# Patient Record
Sex: Male | Born: 1965
Health system: Southern US, Community
[De-identification: ages and names within clinical notes are randomized; demographics above are authoritative.]

## PROBLEM LIST (undated history)

## (undated) DIAGNOSIS — I10 Essential (primary) hypertension: Secondary | ICD-10-CM

## (undated) DIAGNOSIS — Z9989 Dependence on other enabling machines and devices: Secondary | ICD-10-CM

## (undated) DIAGNOSIS — G894 Chronic pain syndrome: Secondary | ICD-10-CM

## (undated) DIAGNOSIS — M1990 Unspecified osteoarthritis, unspecified site: Secondary | ICD-10-CM

## (undated) DIAGNOSIS — Z8619 Personal history of other infectious and parasitic diseases: Secondary | ICD-10-CM

## (undated) DIAGNOSIS — Z87442 Personal history of urinary calculi: Secondary | ICD-10-CM

## (undated) DIAGNOSIS — T148XXA Other injury of unspecified body region, initial encounter: Secondary | ICD-10-CM

## (undated) DIAGNOSIS — Z993 Dependence on wheelchair: Secondary | ICD-10-CM

## (undated) DIAGNOSIS — M8660 Other chronic osteomyelitis, unspecified site: Secondary | ICD-10-CM

## (undated) DIAGNOSIS — F32A Depression, unspecified: Secondary | ICD-10-CM

## (undated) DIAGNOSIS — J9621 Acute and chronic respiratory failure with hypoxia: Secondary | ICD-10-CM

## (undated) DIAGNOSIS — F419 Anxiety disorder, unspecified: Secondary | ICD-10-CM

## (undated) DIAGNOSIS — R7881 Bacteremia: Secondary | ICD-10-CM

## (undated) DIAGNOSIS — T07XXXA Unspecified multiple injuries, initial encounter: Secondary | ICD-10-CM

## (undated) DIAGNOSIS — J45909 Unspecified asthma, uncomplicated: Secondary | ICD-10-CM

## (undated) DIAGNOSIS — J449 Chronic obstructive pulmonary disease, unspecified: Secondary | ICD-10-CM

## (undated) HISTORY — PX: HIP SURGERY: SHX245

## (undated) HISTORY — DX: Personal history of other infectious and parasitic diseases: Z86.19

## (undated) HISTORY — PX: SHOULDER SURGERY: SHX246

## (undated) HISTORY — PX: FACIAL RECONSTRUCTION SURGERY: SHX631

---

## 2009-11-11 DIAGNOSIS — J189 Pneumonia, unspecified organism: Secondary | ICD-10-CM

## 2009-11-11 HISTORY — DX: Pneumonia, unspecified organism: J18.9

## 2011-10-31 ENCOUNTER — Encounter (HOSPITAL_BASED_OUTPATIENT_CLINIC_OR_DEPARTMENT_OTHER): Payer: Self-pay | Admitting: *Deleted

## 2011-10-31 ENCOUNTER — Emergency Department (HOSPITAL_BASED_OUTPATIENT_CLINIC_OR_DEPARTMENT_OTHER)
Admission: EM | Admit: 2011-10-31 | Discharge: 2011-10-31 | Disposition: A | Discharge status: Home / Self Care | Payer: Worker's Compensation | Attending: Emergency Medicine | Admitting: Emergency Medicine

## 2011-10-31 DIAGNOSIS — Y9289 Other specified places as the place of occurrence of the external cause: Secondary | ICD-10-CM | POA: Insufficient documentation

## 2011-10-31 DIAGNOSIS — W2209XA Striking against other stationary object, initial encounter: Secondary | ICD-10-CM | POA: Insufficient documentation

## 2011-10-31 DIAGNOSIS — S0990XA Unspecified injury of head, initial encounter: Secondary | ICD-10-CM | POA: Insufficient documentation

## 2011-10-31 DIAGNOSIS — T148XXA Other injury of unspecified body region, initial encounter: Secondary | ICD-10-CM

## 2011-10-31 MED ORDER — TRAMADOL HCL 50 MG PO TABS: 50.0000 mg | ORAL_TABLET | Freq: Four times a day (QID) | ORAL | Status: AC | PRN

## 2011-10-31 MED ORDER — TETANUS-DIPHTH-ACELL PERTUSSIS 5-2.5-18.5 LF-MCG/0.5 IM SUSP
0.5000 mL | Freq: Once | INTRAMUSCULAR | Status: AC
  Administered 2011-10-31: 0.5 mL via INTRAMUSCULAR
  Filled 2011-10-31: qty 0.5

## 2011-10-31 NOTE — ED Notes (Signed)
MD at bedside. 

## 2011-10-31 NOTE — Discharge Instructions (Signed)
Abrasions Abrasions are skin scrapes. Their treatment depends on how large and deep the abrasion is. Abrasions do not extend through all layers of the skin. A cut or lesion through all skin layers is called a laceration. HOME CARE INSTRUCTIONS   If you were given a dressing, change it at least once a day or as instructed by your caregiver. If the bandage sticks, soak it off with a solution of water or hydrogen peroxide.   Twice a day, wash the area with soap and water to remove all the cream/ointment. You may do this in a sink, under a tub faucet, or in a shower. Rinse off the soap and pat dry with a clean towel. Look for signs of infection (see below).   Reapply cream/ointment according to your caregiver's instruction. This will help prevent infection and keep the bandage from sticking. Telfa or gauze over the wound and under the dressing or wrap will also help keep the bandage from sticking.   If the bandage becomes wet, dirty, or develops a foul smell, change it as soon as possible.   Only take over-the-counter or prescription medicines for pain, discomfort, or fever as directed by your caregiver.  SEEK IMMEDIATE MEDICAL CARE IF:   Increasing pain in the wound.   Signs of infection develop: redness, swelling, surrounding area is tender to touch, or pus coming from the wound.   You have a fever.   Any foul smell coming from the wound or dressing.  Most skin wounds heal within ten days. Facial wounds heal faster. However, an infection may occur despite proper treatment. You should have the wound checked for signs of infection within 24 to 48 hours or sooner if problems arise. If you were not given a wound-check appointment, look closely at the wound yourself on the second day for early signs of infection listed above. MAKE SURE YOU:   Understand these instructions.   Will watch your condition.   Will get help right away if you are not doing well or get worse.  Document Released:  03/09/2005 Document Revised: 05/19/2011 Document Reviewed: 05/03/2011 Exit[employer] Patient Information 2012 [employer]. 

## 2011-10-31 NOTE — ED Notes (Signed)
Forehead and bridge of nose cleansed prior to DC.
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
## 2011-10-31 NOTE — ED Notes (Signed)
Pt was at work.  Raised up from kneeling and hit foreheadon metal object.  Fell back to floor. Denies LOC.  Dizzy.  Sts he stayed sitting on floor for 10 min.  C/O pain in forehead and bridge of nose.

## 2011-10-31 NOTE — ED Provider Notes (Signed)
History     CSN: [id number]  Arrival date & time 10/31/11  2042   First MD Initiated Contact with Patient 10/31/11 2102      Chief Complaint  Patient presents with  . Head Injury    (Consider location/radiation/quality/duration/timing/severity/associated sxs/prior treatment) HPI The patient is a 46 yo man, no significant PMH, presenting with a head injury.  The patient was at work 1 hour PTA, squatting while moving some boxes, turned around and stood up, but hit his forehead and the bridge of his nose on an overlying metal shelf.  He fell backwards in surprise afterwards, and noted significant face pain, but did not lose conscious.  No prodromal dizziness, LH, aura, or focal neuro deficit.  The patient noted mild lightheadedness after the event, but no vomiting, significant bleeding, LOC, observed tonic-clonic movements, loss of bowel/bladder, tongue biting, or confusion.  No discharge from nose or ears.  History reviewed. No pertinent past medical history.  Past Surgical History  Procedure Date  . Shoulder surgery   . Hip surgery     No family history on file.  History  Substance Use Topics  . Smoking status: Current Everyday Smoker -- 1.0 packs/day  . Smokeless tobacco: Not on file  . Alcohol Use: Yes     "Every now and then"      Review of Systems General: no fevers, chills, changes in weight, changes in appetite Skin: no rash HEENT: no blurry vision, hearing changes, sore throat Pulm: no dyspnea, coughing, wheezing CV: no chest pain, palpitations, shortness of breath Abd: no abdominal pain, nausea/vomiting, diarrhea/constipation GU: no dysuria, hematuria, polyuria Ext: no arthralgias, myalgias Neuro: no weakness, numbness, or tingling  Allergies  Review of patient's allergies indicates no known allergies.  Home Medications  No current outpatient prescriptions on file.  BP 136/79  Pulse 67  Temp(Src) 97.5 F (36.4 C) (Oral)  Resp 16  Ht 6\' 3"  (1.905 m)   Wt 145 lb (65.772 kg)  BMI 18.12 kg/m2  SpO2 100%  Physical Exam General: alert, cooperative, and in no apparent distress HEENT: superficial lacerations noted on left forehead and bridge of nose.  No pain on palpation or manipulation of frontal bones, nasal cartilage, or facial bones.  PERRL, EOMI, no nystagmus, vision intact, oropharynx clear and non-erythematous  Neck: supple, no lymphadenopathy, no JVD Lungs: clear to ascultation bilaterally, normal work of respiration, no wheezes, rales, ronchi Heart: regular rate and rhythm, no murmurs, gallops, or rubs Abdomen: soft, non-tender, non-distended, normal bowel sounds Extremities: no cyanosis, clubbing, or edema Neurologic: alert & oriented X3, cranial nerves II-XII intact, strength grossly intact, sensation intact to light touch  ED Course  Procedures (including critical care time)  Labs Reviewed - No data to display No results found.   No diagnosis found.    MDM   1. Facial lacerations - patient presents with superficial facial lacerations s/p mechanical head injury to a metal shelf while rising from a squatting position, with no prodrome, LOC, vomiting, or seizure-like activity.  Complaints likely represent superficial facial laceration.  No indication for head CT or other work-up at this time, given benign presentation and normal neuro exam. -no further work-up indicated  2. Dispo - discharge home with pain control  Signed, Janalyn Harder, PGY1 10/31/2011, 9:40 PM  Linward Headland, MD 10/31/11 2213

## 2011-11-01 NOTE — ED Provider Notes (Signed)
I saw and evaluated the patient, reviewed the resident's note and I agree with the findings and plan.   .Face to face Exam:  General:  Awake HEENT:  Atraumatic Resp:  Normal effort Abd:  Nondistended Neuro:No focal weakness Lymph: No adenopathy   Deloros Beretta L Chaselyn Nanney, MD 11/01/11 1645 

## 2017-04-06 ENCOUNTER — Emergency Department (HOSPITAL_BASED_OUTPATIENT_CLINIC_OR_DEPARTMENT_OTHER)
Admission: EM | Admit: 2017-04-06 | Discharge: 2017-04-06 | Disposition: A | Discharge status: Home / Self Care | Payer: 59 | Attending: Emergency Medicine | Admitting: Emergency Medicine

## 2017-04-06 ENCOUNTER — Encounter (HOSPITAL_BASED_OUTPATIENT_CLINIC_OR_DEPARTMENT_OTHER): Payer: Self-pay | Admitting: Emergency Medicine

## 2017-04-06 DIAGNOSIS — K0889 Other specified disorders of teeth and supporting structures: Secondary | ICD-10-CM | POA: Diagnosis present

## 2017-04-06 DIAGNOSIS — K047 Periapical abscess without sinus: Secondary | ICD-10-CM

## 2017-04-06 DIAGNOSIS — F17200 Nicotine dependence, unspecified, uncomplicated: Secondary | ICD-10-CM | POA: Diagnosis not present

## 2017-04-06 MED ORDER — PENICILLIN V POTASSIUM 500 MG PO TABS: 500.0000 mg | ORAL_TABLET | Freq: Four times a day (QID) | ORAL | 0 refills | Status: AC

## 2017-04-06 MED FILL — PENICILLIN VK 500 MG TABLET: 500 | 7 days supply | Qty: 28 | Fill #0

## 2017-04-06 NOTE — ED Provider Notes (Signed)
[facility] EMERGENCY DEPARTMENT Provider Note  CSN: [id number] Arrival date & time: 04/06/17 0704  Chief Complaint(s) Dental Pain  HPI Christopher [name] is a 51 y.o. male who presents with 2 days of right lower molar pain and 1 day of swelling.  No associated fevers, chills, nausea, vomiting, neck pain or swelling.  No shortness of breath or trouble breathing.  Pain is exacerbated with chewing and palpation.  Relieved by Motrin.  No other alleviating or aggravating factors.  No other associated symptoms.  HPI  Past Medical History History reviewed. No pertinent past medical history. There are no active problems to display for this patient.  Home Medication(s) Prior to Admission medications   Medication Sig Start Date End Date Taking? Authorizing Provider  ibuprofen (ADVIL,MOTRIN) 200 MG tablet Take 600 mg by mouth every 6 (six) hours as needed. Patient used this medication for his headache.    [provider]  penicillin v potassium (VEETID) 500 MG tablet Take 1 tablet (500 mg total) by mouth 4 (four) times daily. 04/06/17 04/13/17  Nira Conn, MD                                                                                                                                    Past Surgical History Past Surgical History:  Procedure Laterality Date  . HIP SURGERY    . SHOULDER SURGERY     Family History History reviewed. No pertinent family history.  Social History Social History  Substance Use Topics  . Smoking status: Current Every Day Smoker    Packs/day: 1.00  . Smokeless tobacco: Never Used  . Alcohol use Yes     Comment: "Every now and then"   Allergies Patient has no known allergies.  Review of Systems Review of Systems Noted in HPI  Physical Exam Vital Signs  I have reviewed the triage vital signs BP (!) 149/90 (BP Location: Left Arm)   Pulse (!) 101   Temp (!) 97.5 F (36.4 C) (Oral)   Resp 18   Ht 6\' 3"  (1.905 m)   Wt  68 kg (150 lb)   SpO2 100%   BMI 18.75 kg/m   Physical Exam  Constitutional: He is oriented to person, place, and time. He appears well-developed and well-nourished. No distress.  HENT:  Head: Normocephalic and atraumatic.    Right Ear: External ear normal.  Left Ear: External ear normal.  Nose: Nose normal.  Mouth/Throat: Mucous membranes are normal. No trismus in the jaw. Abnormal dentition. Dental caries present.    Eyes: Conjunctivae and EOM are normal. No scleral icterus.  Neck: Normal range of motion and phonation normal.  Cardiovascular: Normal rate and regular rhythm.   Pulmonary/Chest: Effort normal. No stridor. No respiratory distress.  Abdominal: He exhibits no distension.  Musculoskeletal: Normal range of motion. He exhibits no edema.  Neurological: He is alert and oriented to person, place, and time.  Skin: He is not diaphoretic.  Psychiatric: He has a normal mood and affect. His behavior is normal.  Vitals reviewed.   ED Results and Treatments Labs (all labs ordered are listed, but only abnormal results are displayed) Labs Reviewed - No data to display                                                                                                                       EKG  EKG Interpretation  Date/Time:    Ventricular Rate:    PR Interval:    QRS Duration:   QT Interval:    QTC Calculation:   R Axis:     Text Interpretation:        Radiology No results found. Pertinent labs & imaging results that were available during my care of the patient were reviewed by me and considered in my medical decision making (see chart for details).  Medications Ordered in ED Medications - No data to display                                                                                                                                  Procedures Procedures  (including critical care time)  Medical Decision Making / ED Course I have reviewed the nursing notes for  this encounter and the patient's prior records (if available in EHR or on provided paperwork).    Dental abscess. No evidence of Ludwig's angina or deeper tissue infection. No drainable fluctuance appreciated. Will treat with Abx. Pt has dental insurance and will set up dentistry follow up within the week.  The patient is safe for discharge with strict return precautions.   Final Clinical Impression(s) / ED Diagnoses Final diagnoses:  Dental abscess    Disposition: Discharge  Condition: Good  I have discussed the results, Dx and Tx plan with the patient who expressed understanding and agree(s) with the plan. Discharge instructions discussed at great length. The patient was given strict return precautions who verbalized understanding of the instructions. No further questions at time of discharge.    New Prescriptions   PENICILLIN V POTASSIUM (VEETID) 500 MG TABLET    Take 1 tablet (500 mg total) by mouth 4 (four) times daily.    Follow Up: Dentistry   in 3-5 days for close follow up and further management     This chart was dictated using voice recognition software.  Despite best efforts to  proofread,  errors can occur which can change the documentation meaning.   Nira Connardama, Megham Dwyer Eduardo, MD 04/06/17 [phone]

## 2017-04-06 NOTE — ED Triage Notes (Signed)
Patient states that he has pain and swelling to his right lower jaw and dental pain

## 2018-06-13 DIAGNOSIS — I82409 Acute embolism and thrombosis of unspecified deep veins of unspecified lower extremity: Secondary | ICD-10-CM

## 2018-06-13 DIAGNOSIS — J9621 Acute and chronic respiratory failure with hypoxia: Secondary | ICD-10-CM

## 2018-06-13 DIAGNOSIS — Z9289 Personal history of other medical treatment: Secondary | ICD-10-CM

## 2018-06-13 DIAGNOSIS — B965 Pseudomonas (aeruginosa) (mallei) (pseudomallei) as the cause of diseases classified elsewhere: Secondary | ICD-10-CM

## 2018-06-13 DIAGNOSIS — R7881 Bacteremia: Secondary | ICD-10-CM

## 2018-06-13 HISTORY — DX: Acute and chronic respiratory failure with hypoxia: J96.21

## 2018-06-13 HISTORY — DX: Bacteremia: R78.81

## 2018-06-13 HISTORY — DX: Pseudomonas (aeruginosa) (mallei) (pseudomallei) as the cause of diseases classified elsewhere: B96.5

## 2018-06-13 HISTORY — PX: COLON SURGERY: SHX602

## 2018-06-13 HISTORY — DX: Acute embolism and thrombosis of unspecified deep veins of unspecified lower extremity: I82.409

## 2018-06-13 HISTORY — DX: Personal history of other medical treatment: Z92.89

## 2018-07-10 ENCOUNTER — Inpatient Hospital Stay (HOSPITAL_COMMUNITY)
Admission: EM | Admit: 2018-07-10 | Discharge: 2018-10-16 | DRG: 003 | Disposition: A | Discharge status: Other Acute Inpatient Hospital | Payer: No Typology Code available for payment source | Attending: General Surgery | Admitting: General Surgery

## 2018-07-10 ENCOUNTER — Emergency Department (HOSPITAL_COMMUNITY): Payer: No Typology Code available for payment source

## 2018-07-10 ENCOUNTER — Emergency Department (HOSPITAL_COMMUNITY): Payer: No Typology Code available for payment source | Admitting: Certified Registered"

## 2018-07-10 ENCOUNTER — Other Ambulatory Visit: Payer: Self-pay

## 2018-07-10 ENCOUNTER — Encounter (HOSPITAL_COMMUNITY)
Admission: EM | Disposition: A | Discharge status: Nursing Home (Includes ICF) | Payer: Self-pay | Source: Home / Self Care

## 2018-07-10 ENCOUNTER — Encounter (HOSPITAL_COMMUNITY): Payer: Self-pay

## 2018-07-10 ENCOUNTER — Inpatient Hospital Stay (HOSPITAL_COMMUNITY): Payer: No Typology Code available for payment source

## 2018-07-10 DIAGNOSIS — R58 Hemorrhage, not elsewhere classified: Secondary | ICD-10-CM | POA: Diagnosis present

## 2018-07-10 DIAGNOSIS — S31104A Unspecified open wound of abdominal wall, left lower quadrant without penetration into peritoneal cavity, initial encounter: Secondary | ICD-10-CM | POA: Diagnosis present

## 2018-07-10 DIAGNOSIS — R339 Retention of urine, unspecified: Secondary | ICD-10-CM | POA: Diagnosis present

## 2018-07-10 DIAGNOSIS — J189 Pneumonia, unspecified organism: Secondary | ICD-10-CM | POA: Diagnosis not present

## 2018-07-10 DIAGNOSIS — T1490XA Injury, unspecified, initial encounter: Secondary | ICD-10-CM

## 2018-07-10 DIAGNOSIS — S31829A Unspecified open wound of left buttock, initial encounter: Secondary | ICD-10-CM | POA: Diagnosis not present

## 2018-07-10 DIAGNOSIS — R739 Hyperglycemia, unspecified: Secondary | ICD-10-CM | POA: Diagnosis present

## 2018-07-10 DIAGNOSIS — S381XXA Crushing injury of abdomen, lower back, and pelvis, initial encounter: Secondary | ICD-10-CM

## 2018-07-10 DIAGNOSIS — Q899 Congenital malformation, unspecified: Secondary | ICD-10-CM

## 2018-07-10 DIAGNOSIS — E872 Acidosis: Secondary | ICD-10-CM | POA: Diagnosis present

## 2018-07-10 DIAGNOSIS — Z419 Encounter for procedure for purposes other than remedying health state, unspecified: Secondary | ICD-10-CM

## 2018-07-10 DIAGNOSIS — W230XXA Caught, crushed, jammed, or pinched between moving objects, initial encounter: Secondary | ICD-10-CM | POA: Diagnosis present

## 2018-07-10 DIAGNOSIS — Z930 Tracheostomy status: Secondary | ICD-10-CM

## 2018-07-10 DIAGNOSIS — D62 Acute posthemorrhagic anemia: Secondary | ICD-10-CM | POA: Diagnosis not present

## 2018-07-10 DIAGNOSIS — S32811B Multiple fractures of pelvis with unstable disruption of pelvic ring, initial encounter for open fracture: Secondary | ICD-10-CM | POA: Diagnosis present

## 2018-07-10 DIAGNOSIS — E43 Unspecified severe protein-calorie malnutrition: Secondary | ICD-10-CM | POA: Diagnosis present

## 2018-07-10 DIAGNOSIS — E870 Hyperosmolality and hypernatremia: Secondary | ICD-10-CM | POA: Diagnosis not present

## 2018-07-10 DIAGNOSIS — S71102A Unspecified open wound, left thigh, initial encounter: Secondary | ICD-10-CM | POA: Diagnosis not present

## 2018-07-10 DIAGNOSIS — S31819A Unspecified open wound of right buttock, initial encounter: Secondary | ICD-10-CM | POA: Diagnosis not present

## 2018-07-10 DIAGNOSIS — S3130XA Unspecified open wound of scrotum and testes, initial encounter: Secondary | ICD-10-CM | POA: Diagnosis present

## 2018-07-10 DIAGNOSIS — E875 Hyperkalemia: Secondary | ICD-10-CM | POA: Diagnosis not present

## 2018-07-10 DIAGNOSIS — J9600 Acute respiratory failure, unspecified whether with hypoxia or hypercapnia: Secondary | ICD-10-CM | POA: Diagnosis not present

## 2018-07-10 DIAGNOSIS — K117 Disturbances of salivary secretion: Secondary | ICD-10-CM

## 2018-07-10 DIAGNOSIS — L8990 Pressure ulcer of unspecified site, unspecified stage: Secondary | ICD-10-CM

## 2018-07-10 DIAGNOSIS — R600 Localized edema: Secondary | ICD-10-CM | POA: Diagnosis not present

## 2018-07-10 DIAGNOSIS — E876 Hypokalemia: Secondary | ICD-10-CM | POA: Diagnosis present

## 2018-07-10 DIAGNOSIS — Z681 Body mass index (BMI) 19 or less, adult: Secondary | ICD-10-CM

## 2018-07-10 DIAGNOSIS — T79A3XA Traumatic compartment syndrome of abdomen, initial encounter: Secondary | ICD-10-CM | POA: Diagnosis present

## 2018-07-10 DIAGNOSIS — Z09 Encounter for follow-up examination after completed treatment for conditions other than malignant neoplasm: Secondary | ICD-10-CM

## 2018-07-10 DIAGNOSIS — R578 Other shock: Secondary | ICD-10-CM | POA: Diagnosis present

## 2018-07-10 DIAGNOSIS — J9621 Acute and chronic respiratory failure with hypoxia: Secondary | ICD-10-CM | POA: Diagnosis not present

## 2018-07-10 DIAGNOSIS — R000 Tachycardia, unspecified: Secondary | ICD-10-CM | POA: Diagnosis not present

## 2018-07-10 DIAGNOSIS — J9601 Acute respiratory failure with hypoxia: Secondary | ICD-10-CM | POA: Diagnosis present

## 2018-07-10 DIAGNOSIS — S2242XA Multiple fractures of ribs, left side, initial encounter for closed fracture: Secondary | ICD-10-CM | POA: Diagnosis present

## 2018-07-10 DIAGNOSIS — B9689 Other specified bacterial agents as the cause of diseases classified elsewhere: Secondary | ICD-10-CM | POA: Diagnosis not present

## 2018-07-10 DIAGNOSIS — S3730XA Unspecified injury of urethra, initial encounter: Secondary | ICD-10-CM | POA: Diagnosis present

## 2018-07-10 DIAGNOSIS — R7881 Bacteremia: Secondary | ICD-10-CM | POA: Diagnosis not present

## 2018-07-10 DIAGNOSIS — N390 Urinary tract infection, site not specified: Secondary | ICD-10-CM | POA: Diagnosis not present

## 2018-07-10 DIAGNOSIS — N179 Acute kidney failure, unspecified: Secondary | ICD-10-CM | POA: Diagnosis not present

## 2018-07-10 DIAGNOSIS — B952 Enterococcus as the cause of diseases classified elsewhere: Secondary | ICD-10-CM | POA: Diagnosis not present

## 2018-07-10 DIAGNOSIS — J95851 Ventilator associated pneumonia: Secondary | ICD-10-CM

## 2018-07-10 DIAGNOSIS — I10 Essential (primary) hypertension: Secondary | ICD-10-CM | POA: Diagnosis present

## 2018-07-10 DIAGNOSIS — T07XXXA Unspecified multiple injuries, initial encounter: Secondary | ICD-10-CM | POA: Diagnosis not present

## 2018-07-10 DIAGNOSIS — D696 Thrombocytopenia, unspecified: Secondary | ICD-10-CM | POA: Diagnosis not present

## 2018-07-10 DIAGNOSIS — S332XXA Dislocation of sacroiliac and sacrococcygeal joint, initial encounter: Secondary | ICD-10-CM | POA: Diagnosis present

## 2018-07-10 DIAGNOSIS — N4889 Other specified disorders of penis: Secondary | ICD-10-CM | POA: Diagnosis present

## 2018-07-10 DIAGNOSIS — R509 Fever, unspecified: Secondary | ICD-10-CM | POA: Diagnosis not present

## 2018-07-10 DIAGNOSIS — S7290XA Unspecified fracture of unspecified femur, initial encounter for closed fracture: Secondary | ICD-10-CM

## 2018-07-10 DIAGNOSIS — R1110 Vomiting, unspecified: Secondary | ICD-10-CM

## 2018-07-10 DIAGNOSIS — S72002A Fracture of unspecified part of neck of left femur, initial encounter for closed fracture: Secondary | ICD-10-CM | POA: Diagnosis present

## 2018-07-10 DIAGNOSIS — R001 Bradycardia, unspecified: Secondary | ICD-10-CM | POA: Diagnosis not present

## 2018-07-10 DIAGNOSIS — T148XXA Other injury of unspecified body region, initial encounter: Secondary | ICD-10-CM

## 2018-07-10 DIAGNOSIS — G894 Chronic pain syndrome: Secondary | ICD-10-CM | POA: Diagnosis not present

## 2018-07-10 DIAGNOSIS — H93299 Other abnormal auditory perceptions, unspecified ear: Secondary | ICD-10-CM

## 2018-07-10 DIAGNOSIS — Z4659 Encounter for fitting and adjustment of other gastrointestinal appliance and device: Secondary | ICD-10-CM

## 2018-07-10 DIAGNOSIS — Z978 Presence of other specified devices: Secondary | ICD-10-CM

## 2018-07-10 DIAGNOSIS — S81802A Unspecified open wound, left lower leg, initial encounter: Secondary | ICD-10-CM | POA: Diagnosis not present

## 2018-07-10 DIAGNOSIS — K56609 Unspecified intestinal obstruction, unspecified as to partial versus complete obstruction: Secondary | ICD-10-CM

## 2018-07-10 DIAGNOSIS — Z9911 Dependence on respirator [ventilator] status: Secondary | ICD-10-CM

## 2018-07-10 DIAGNOSIS — I96 Gangrene, not elsewhere classified: Secondary | ICD-10-CM | POA: Diagnosis not present

## 2018-07-10 DIAGNOSIS — S329XXA Fracture of unspecified parts of lumbosacral spine and pelvis, initial encounter for closed fracture: Secondary | ICD-10-CM

## 2018-07-10 DIAGNOSIS — Z885 Allergy status to narcotic agent status: Secondary | ICD-10-CM

## 2018-07-10 DIAGNOSIS — R21 Rash and other nonspecific skin eruption: Secondary | ICD-10-CM | POA: Diagnosis not present

## 2018-07-10 DIAGNOSIS — T17908A Unspecified foreign body in respiratory tract, part unspecified causing other injury, initial encounter: Secondary | ICD-10-CM

## 2018-07-10 DIAGNOSIS — R1310 Dysphagia, unspecified: Secondary | ICD-10-CM | POA: Diagnosis not present

## 2018-07-10 DIAGNOSIS — J9690 Respiratory failure, unspecified, unspecified whether with hypoxia or hypercapnia: Secondary | ICD-10-CM

## 2018-07-10 DIAGNOSIS — S81809A Unspecified open wound, unspecified lower leg, initial encounter: Secondary | ICD-10-CM

## 2018-07-10 DIAGNOSIS — B965 Pseudomonas (aeruginosa) (mallei) (pseudomallei) as the cause of diseases classified elsewhere: Secondary | ICD-10-CM | POA: Diagnosis not present

## 2018-07-10 DIAGNOSIS — F17210 Nicotine dependence, cigarettes, uncomplicated: Secondary | ICD-10-CM | POA: Diagnosis present

## 2018-07-10 DIAGNOSIS — E878 Other disorders of electrolyte and fluid balance, not elsewhere classified: Secondary | ICD-10-CM | POA: Diagnosis not present

## 2018-07-10 DIAGNOSIS — S3994XA Unspecified injury of external genitals, initial encounter: Secondary | ICD-10-CM

## 2018-07-10 DIAGNOSIS — J811 Chronic pulmonary edema: Secondary | ICD-10-CM

## 2018-07-10 DIAGNOSIS — D638 Anemia in other chronic diseases classified elsewhere: Secondary | ICD-10-CM | POA: Diagnosis present

## 2018-07-10 DIAGNOSIS — S3120XA Unspecified open wound of penis, initial encounter: Secondary | ICD-10-CM | POA: Diagnosis present

## 2018-07-10 HISTORY — PX: IR ANGIOGRAM PELVIS SELECTIVE OR SUPRASELECTIVE: IMG661

## 2018-07-10 HISTORY — PX: IR US GUIDE VASC ACCESS RIGHT: IMG2390

## 2018-07-10 HISTORY — PX: IR EMBO ART  VEN HEMORR LYMPH EXTRAV  INC GUIDE ROADMAPPING: IMG5450

## 2018-07-10 HISTORY — PX: LAPAROTOMY: SHX154

## 2018-07-10 HISTORY — PX: IR US GUIDE BX ASP/DRAIN: IMG2392

## 2018-07-10 HISTORY — PX: RADIOLOGY WITH ANESTHESIA: SHX6223

## 2018-07-10 HISTORY — PX: WOUND EXPLORATION: SHX6188

## 2018-07-10 HISTORY — PX: APPLICATION OF WOUND VAC: SHX5189

## 2018-07-10 HISTORY — PX: IR ANGIOGRAM SELECTIVE EACH ADDITIONAL VESSEL: IMG667

## 2018-07-10 HISTORY — PX: IR VENO/EXT/UNI LEFT: IMG675

## 2018-07-10 LAB — DIC (DISSEMINATED INTRAVASCULAR COAGULATION)PANEL
D-Dimer, Quant: 12.56 ug/mL-FEU — ABNORMAL HIGH (ref 0.00–0.50)
D-Dimer, Quant: >20 ug/mL-FEU — ABNORMAL HIGH (ref 0.00–0.50)
Fibrinogen: 184 mg/dL — ABNORMAL LOW (ref 210–475)
Fibrinogen: 220 mg/dL (ref 210–475)
INR: 1.39
INR: 1.41
Platelets: 142 10*3/uL — ABNORMAL LOW (ref 150–400)
Platelets: 43 10*3/uL — ABNORMAL LOW (ref 150–400)
Prothrombin Time: 16.9 seconds — ABNORMAL HIGH (ref 11.4–15.2)
Smear Review: NONE SEEN
Smear Review: NONE SEEN
aPTT: 30 seconds (ref 24–36)

## 2018-07-10 LAB — CBC
HCT: 31.1 % — ABNORMAL LOW (ref 39.0–52.0)
Hemoglobin: 9.2 g/dL — ABNORMAL LOW (ref 13.0–17.0)
MCH: 32.4 pg (ref 26.0–34.0)
MCHC: 29.6 g/dL — ABNORMAL LOW (ref 30.0–36.0)
MCV: 109.5 fL — ABNORMAL HIGH (ref 80.0–100.0)
Platelets: 142 10*3/uL — ABNORMAL LOW (ref 150–400)
RBC: 2.84 MIL/uL — ABNORMAL LOW (ref 4.22–5.81)
RDW: 13 % (ref 11.5–15.5)
WBC: 19.5 10*3/uL — ABNORMAL HIGH (ref 4.0–10.5)
nRBC: 0 % (ref 0.0–0.2)

## 2018-07-10 LAB — PROTIME-INR
INR: 1.36
Prothrombin Time: 16.6 seconds — ABNORMAL HIGH (ref 11.4–15.2)

## 2018-07-10 LAB — COMPREHENSIVE METABOLIC PANEL
ALT: 30 U/L (ref 0–44)
AST: 32 U/L (ref 15–41)
Albumin: 2.5 g/dL — ABNORMAL LOW (ref 3.5–5.0)
Alkaline Phosphatase: 48 U/L (ref 38–126)
Anion gap: 8 (ref 5–15)
BUN: 18 mg/dL (ref 6–20)
CO2: 16 mmol/L — ABNORMAL LOW (ref 22–32)
Calcium: 7.6 mg/dL — ABNORMAL LOW (ref 8.9–10.3)
Chloride: 116 mmol/L — ABNORMAL HIGH (ref 98–111)
Creatinine, Ser: 0.9 mg/dL (ref 0.61–1.24)
GFR calc Af Amer: >60 mL/min (ref >60)
GFR calc non Af Amer: >60 mL/min (ref >60)
Glucose, Bld: 175 mg/dL — ABNORMAL HIGH (ref 70–99)
Potassium: 2.9 mmol/L — ABNORMAL LOW (ref 3.5–5.1)
Sodium: 140 mmol/L (ref 135–145)
Total Bilirubin: 0.6 mg/dL (ref 0.3–1.2)
Total Protein: 4.2 g/dL — ABNORMAL LOW (ref 6.5–8.1)

## 2018-07-10 LAB — ABO/RH: ABO/RH(D): O POS

## 2018-07-10 LAB — LACTIC ACID, PLASMA: Lactic Acid, Venous: 5.1 mmol/L (ref 0.5–1.9)

## 2018-07-10 LAB — DIC (DISSEMINATED INTRAVASCULAR COAGULATION) PANEL (NOT AT ARMC)
Prothrombin Time: 17.1 seconds — ABNORMAL HIGH (ref 11.4–15.2)
aPTT: 56 seconds — ABNORMAL HIGH (ref 24–36)

## 2018-07-10 LAB — ETHANOL: Alcohol, Ethyl (B): <10 mg/dL (ref <10)

## 2018-07-10 LAB — MASSIVE TRANSFUSION PROTOCOL ORDER (BLOOD BANK NOTIFICATION)

## 2018-07-10 LAB — CDS SEROLOGY

## 2018-07-10 SURGERY — WOUND EXPLORATION
Anesthesia: General | Site: Groin | Laterality: Right

## 2018-07-10 SURGERY — IR WITH ANESTHESIA
Anesthesia: General

## 2018-07-10 MED ORDER — FENTANYL CITRATE (PF) 250 MCG/5ML IJ SOLN
INTRAMUSCULAR | Status: DC | PRN
  Administered 2018-07-10: 100 ug via INTRAVENOUS
  Administered 2018-07-10: 50 ug via INTRAVENOUS
  Administered 2018-07-10 (×2): 100 ug via INTRAVENOUS
  Administered 2018-07-10: 50 ug via INTRAVENOUS
  Administered 2018-07-10: 100 ug via INTRAVENOUS

## 2018-07-10 MED ORDER — PANTOPRAZOLE SODIUM 40 MG IV SOLR
40.0000 mg | Freq: Every day | INTRAVENOUS | Status: DC
  Administered 2018-07-11 – 2018-08-29 (×41): 40 mg via INTRAVENOUS
  Filled 2018-07-10 (×42): qty 40

## 2018-07-10 MED ORDER — FUROSEMIDE 10 MG/ML IJ SOLN
INTRAMUSCULAR | Status: AC
  Filled 2018-07-10: qty 4

## 2018-07-10 MED ORDER — ETOMIDATE 2 MG/ML IV SOLN
INTRAVENOUS | Status: AC | PRN
  Administered 2018-07-10: 20 mg via INTRAVENOUS

## 2018-07-10 MED ORDER — CEFAZOLIN SODIUM-DEXTROSE 2-4 GM/100ML-% IV SOLN: 2.0000 g | Freq: Once | INTRAVENOUS | Status: DC

## 2018-07-10 MED ORDER — IOPAMIDOL (ISOVUE-300) INJECTION 61%
INTRAVENOUS | Status: AC
  Filled 2018-07-10: qty 100

## 2018-07-10 MED ORDER — PANTOPRAZOLE SODIUM 40 MG PO TBEC
40.0000 mg | DELAYED_RELEASE_TABLET | Freq: Every day | ORAL | Status: DC
  Administered 2018-07-14 – 2018-08-25 (×8): 40 mg via ORAL
  Filled 2018-07-10 (×15): qty 1

## 2018-07-10 MED ORDER — TRANEXAMIC ACID-NACL 1000-0.7 MG/100ML-% IV SOLN
INTRAVENOUS | Status: AC
  Filled 2018-07-10: qty 100

## 2018-07-10 MED ORDER — HEMOSTATIC AGENTS (NO CHARGE) OPTIME
TOPICAL | Status: DC | PRN
  Administered 2018-07-10: 1

## 2018-07-10 MED ORDER — FENTANYL CITRATE (PF) 250 MCG/5ML IJ SOLN
INTRAMUSCULAR | Status: AC
  Filled 2018-07-10: qty 5

## 2018-07-10 MED ORDER — LACTATED RINGERS IV SOLN
INTRAVENOUS | Status: DC
  Administered 2018-07-11 (×3): via INTRAVENOUS

## 2018-07-10 MED ORDER — FENTANYL BOLUS VIA INFUSION
50.0000 ug | INTRAVENOUS | Status: DC | PRN
  Administered 2018-07-15 – 2018-09-10 (×28): 50 ug via INTRAVENOUS
  Filled 2018-07-10: qty 50

## 2018-07-10 MED ORDER — FENTANYL CITRATE (PF) 100 MCG/2ML IJ SOLN
INTRAMUSCULAR | Status: AC
  Filled 2018-07-10: qty 4

## 2018-07-10 MED ORDER — 0.9 % SODIUM CHLORIDE (POUR BTL) OPTIME
TOPICAL | Status: DC | PRN
  Administered 2018-07-10: 1000 mL

## 2018-07-10 MED ORDER — MIDAZOLAM HCL 2 MG/2ML IJ SOLN
INTRAMUSCULAR | Status: AC
  Filled 2018-07-10: qty 2

## 2018-07-10 MED ORDER — MIDAZOLAM HCL 5 MG/5ML IJ SOLN
INTRAMUSCULAR | Status: AC | PRN
  Administered 2018-07-10: 5 mg via INTRAVENOUS

## 2018-07-10 MED ORDER — FUROSEMIDE 10 MG/ML IJ SOLN
INTRAMUSCULAR | Status: DC | PRN
  Administered 2018-07-10: 40 mg via INTRAVENOUS

## 2018-07-10 MED ORDER — ROCURONIUM BROMIDE 50 MG/5ML IV SOSY
PREFILLED_SYRINGE | INTRAVENOUS | Status: AC
  Filled 2018-07-10: qty 20

## 2018-07-10 MED ORDER — SODIUM BICARBONATE 8.4 % IV SOLN
INTRAVENOUS | Status: AC
  Filled 2018-07-10: qty 50

## 2018-07-10 MED ORDER — CALCIUM CHLORIDE 10 % IV SOLN
INTRAVENOUS | Status: DC | PRN
  Administered 2018-07-10 (×4): 500 mg via INTRAVENOUS

## 2018-07-10 MED ORDER — FENTANYL CITRATE (PF) 100 MCG/2ML IJ SOLN
INTRAMUSCULAR | Status: AC
  Filled 2018-07-10: qty 2

## 2018-07-10 MED ORDER — SUCCINYLCHOLINE CHLORIDE 20 MG/ML IJ SOLN
INTRAMUSCULAR | Status: AC | PRN
  Administered 2018-07-10: 100 mg via INTRAVENOUS

## 2018-07-10 MED ORDER — CEFAZOLIN SODIUM-DEXTROSE 2-3 GM-%(50ML) IV SOLR
INTRAVENOUS | Status: DC | PRN
  Administered 2018-07-10: 2 g via INTRAVENOUS

## 2018-07-10 MED ORDER — SODIUM CHLORIDE 0.9% IV SOLUTION: Freq: Once | INTRAVENOUS | Status: DC

## 2018-07-10 MED ORDER — MIDAZOLAM BOLUS VIA INFUSION
1.0000 mg | INTRAVENOUS | Status: DC | PRN
  Filled 2018-07-10: qty 2

## 2018-07-10 MED ORDER — MIDAZOLAM 50MG/50ML (1MG/ML) PREMIX INFUSION
0.2500 mg/h | INTRAVENOUS | Status: DC
  Filled 2018-07-10 (×2): qty 50

## 2018-07-10 MED ORDER — SODIUM BICARBONATE 8.4 % IV SOLN
INTRAVENOUS | Status: DC | PRN
  Administered 2018-07-10 (×2): 50 meq via INTRAVENOUS

## 2018-07-10 MED ORDER — ARTIFICIAL TEARS OPHTHALMIC OINT
TOPICAL_OINTMENT | OPHTHALMIC | Status: AC
  Filled 2018-07-10: qty 3.5

## 2018-07-10 MED ORDER — PHENYLEPHRINE 40 MCG/ML (10ML) SYRINGE FOR IV PUSH (FOR BLOOD PRESSURE SUPPORT)
PREFILLED_SYRINGE | INTRAVENOUS | Status: AC
  Filled 2018-07-10: qty 10

## 2018-07-10 MED ORDER — SODIUM CHLORIDE 0.9 % IV SOLN
INTRAVENOUS | Status: DC | PRN
  Administered 2018-07-10 (×3): via INTRAVENOUS

## 2018-07-10 MED ORDER — MIDAZOLAM HCL 2 MG/2ML IJ SOLN
INTRAMUSCULAR | Status: AC
  Filled 2018-07-10: qty 6

## 2018-07-10 MED ORDER — TRANEXAMIC ACID-NACL 1000-0.7 MG/100ML-% IV SOLN
INTRAVENOUS | Status: DC | PRN
  Administered 2018-07-10: 1000 mg via INTRAVENOUS

## 2018-07-10 MED ORDER — SODIUM CHLORIDE 0.9 % IV SOLN
INTRAVENOUS | Status: AC | PRN
  Administered 2018-07-10: 2000 mL via INTRAVENOUS

## 2018-07-10 MED ORDER — FENTANYL 2500MCG IN NS 250ML (10MCG/ML) PREMIX INFUSION
25.0000 ug/h | INTRAVENOUS | Status: DC
  Administered 2018-07-10: 200 ug/h via INTRAVENOUS
  Filled 2018-07-10: qty 250

## 2018-07-10 MED ORDER — CALCIUM CHLORIDE 10 % IV SOLN
INTRAVENOUS | Status: AC
  Filled 2018-07-10: qty 10

## 2018-07-10 MED ORDER — FENTANYL 2500MCG IN NS 250ML (10MCG/ML) PREMIX INFUSION
0.0000 ug/h | INTRAVENOUS | Status: DC
  Administered 2018-07-11 – 2018-07-12 (×8): 400 ug/h via INTRAVENOUS
  Administered 2018-07-13: 350 ug/h via INTRAVENOUS
  Administered 2018-07-13: 400 ug/h via INTRAVENOUS
  Administered 2018-07-13: 350 ug/h via INTRAVENOUS
  Administered 2018-07-14: 400 ug/h via INTRAVENOUS
  Administered 2018-07-14: 350 ug/h via INTRAVENOUS
  Administered 2018-07-14 – 2018-07-15 (×4): 400 ug/h via INTRAVENOUS
  Administered 2018-07-15: 375 ug/h via INTRAVENOUS
  Administered 2018-07-15 – 2018-07-27 (×45): 400 ug/h via INTRAVENOUS
  Administered 2018-07-27: 300 ug/h via INTRAVENOUS
  Administered 2018-07-27: 350 ug/h via INTRAVENOUS
  Administered 2018-07-27 – 2018-07-30 (×11): 400 ug/h via INTRAVENOUS
  Administered 2018-07-30: 150 ug/h via INTRAVENOUS
  Administered 2018-07-31 (×3): 400 ug/h via INTRAVENOUS
  Administered 2018-07-31: 300 ug/h via INTRAVENOUS
  Administered 2018-08-01 – 2018-08-02 (×6): 400 ug/h via INTRAVENOUS
  Administered 2018-08-02 – 2018-08-03 (×3): 350 ug/h via INTRAVENOUS
  Administered 2018-08-03: 400 ug/h via INTRAVENOUS
  Administered 2018-08-03: 350 ug/h via INTRAVENOUS
  Administered 2018-08-04 – 2018-08-14 (×41): 400 ug/h via INTRAVENOUS
  Administered 2018-08-15: 300 ug/h via INTRAVENOUS
  Administered 2018-08-15 – 2018-08-20 (×22): 400 ug/h via INTRAVENOUS
  Administered 2018-08-21: 350 ug/h via INTRAVENOUS
  Administered 2018-08-21: 400 ug/h via INTRAVENOUS
  Administered 2018-08-21: 350 ug/h via INTRAVENOUS
  Administered 2018-08-21: 400 ug/h via INTRAVENOUS
  Administered 2018-08-22: 350 ug/h via INTRAVENOUS
  Administered 2018-08-22: 400 ug/h via INTRAVENOUS
  Administered 2018-08-22: 350 ug/h via INTRAVENOUS
  Administered 2018-08-23: 325 ug/h via INTRAVENOUS
  Administered 2018-08-23 (×2): 350 ug/h via INTRAVENOUS
  Administered 2018-08-24: 300 ug/h via INTRAVENOUS
  Administered 2018-08-24: 325 ug/h via INTRAVENOUS
  Administered 2018-08-24 (×2): 375 ug/h via INTRAVENOUS
  Administered 2018-08-25 – 2018-08-26 (×2): 200 ug/h via INTRAVENOUS
  Administered 2018-08-26 – 2018-08-27 (×2): 250 ug/h via INTRAVENOUS
  Filled 2018-07-10 (×184): qty 250

## 2018-07-10 MED ORDER — CEFAZOLIN SODIUM-DEXTROSE 2-4 GM/100ML-% IV SOLN
2.0000 g | Freq: Three times a day (TID) | INTRAVENOUS | Status: DC
  Administered 2018-07-11 (×2): 2 g via INTRAVENOUS
  Filled 2018-07-10 (×3): qty 100

## 2018-07-10 MED ORDER — ROCURONIUM BROMIDE 100 MG/10ML IV SOLN
INTRAVENOUS | Status: DC | PRN
  Administered 2018-07-10 (×2): 50 mg via INTRAVENOUS

## 2018-07-10 MED ORDER — SODIUM CHLORIDE (PF) 0.9 % IJ SOLN
INTRAMUSCULAR | Status: AC
  Filled 2018-07-10: qty 10

## 2018-07-10 MED ORDER — FENTANYL CITRATE (PF) 100 MCG/2ML IJ SOLN
INTRAMUSCULAR | Status: AC | PRN
  Administered 2018-07-10: 100 ug via INTRAVENOUS
  Administered 2018-07-10: 200 ug via INTRAVENOUS
  Administered 2018-07-10: 100 ug via INTRAVENOUS

## 2018-07-10 MED ORDER — IOHEXOL 300 MG/ML  SOLN: 86.0000 mL | Freq: Once | INTRAMUSCULAR | Status: DC | PRN

## 2018-07-10 MED ORDER — MIDAZOLAM HCL 5 MG/5ML IJ SOLN
INTRAMUSCULAR | Status: DC | PRN
  Administered 2018-07-10: 2 mg via INTRAVENOUS

## 2018-07-10 MED ORDER — MIDAZOLAM 50MG/50ML (1MG/ML) PREMIX INFUSION
0.0000 mg/h | INTRAVENOUS | Status: DC
  Administered 2018-07-10: 5 mg/h via INTRAVENOUS
  Filled 2018-07-10: qty 50

## 2018-07-10 SURGICAL SUPPLY — 56 items
BANDAGE ACE 6X5 VEL STRL LF (GAUZE/BANDAGES/DRESSINGS) ×8 IMPLANT
BLADE CLIPPER SURG (BLADE) IMPLANT
BNDG CMPR MED 15X6 ELC VLCR LF (GAUZE/BANDAGES/DRESSINGS) ×1
BNDG ELASTIC 6X15 VLCR STRL LF (GAUZE/BANDAGES/DRESSINGS) ×4 IMPLANT
BNDG GAUZE ELAST 4 BULKY (GAUZE/BANDAGES/DRESSINGS) ×8 IMPLANT
CANISTER SUCT 3000ML PPV (MISCELLANEOUS) ×4 IMPLANT
CANISTER WOUND CARE 500ML ATS (WOUND CARE) ×4 IMPLANT
CHLORAPREP W/TINT 26ML (MISCELLANEOUS) IMPLANT
COVER SURGICAL LIGHT HANDLE (MISCELLANEOUS) ×12 IMPLANT
COVER WAND RF STERILE (DRAPES) ×4 IMPLANT
DRAPE KOVER PROBE W/GEL 5X48 (DRAPES) ×4 IMPLANT
DRAPE LAPAROSCOPIC ABDOMINAL (DRAPES) ×16 IMPLANT
DRAPE WARM FLUID 44X44 (DRAPE) ×4 IMPLANT
DRSG OPSITE POSTOP 4X10 (GAUZE/BANDAGES/DRESSINGS) IMPLANT
DRSG OPSITE POSTOP 4X8 (GAUZE/BANDAGES/DRESSINGS) IMPLANT
ELECT BLADE 6.5 EXT (BLADE) IMPLANT
ELECT CAUTERY BLADE 6.4 (BLADE) ×4 IMPLANT
ELECT REM PT RETURN 9FT ADLT (ELECTROSURGICAL) ×4
ELECTRODE REM PT RTRN 9FT ADLT (ELECTROSURGICAL) ×3 IMPLANT
GLOVE BIO SURGEON STRL SZ 6 (GLOVE) ×4 IMPLANT
GLOVE BIO SURGEON STRL SZ 6.5 (GLOVE) ×4 IMPLANT
GLOVE BIOGEL PI IND STRL 7.5 (GLOVE) ×3 IMPLANT
GLOVE BIOGEL PI IND STRL 8 (GLOVE) ×3 IMPLANT
GLOVE BIOGEL PI INDICATOR 7.5 (GLOVE) ×1
GLOVE BIOGEL PI INDICATOR 8 (GLOVE) ×1
GLOVE ECLIPSE 7.5 STRL STRAW (GLOVE) ×8 IMPLANT
GLOVE INDICATOR 6.5 STRL GRN (GLOVE) ×4 IMPLANT
GLOVE SURG SS PI 7.5 STRL IVOR (GLOVE) ×4 IMPLANT
GOWN STRL REUS W/ TWL LRG LVL3 (GOWN DISPOSABLE) ×6 IMPLANT
GOWN STRL REUS W/TWL LRG LVL3 (GOWN DISPOSABLE) ×2
HEMOSTAT SPONGE AVITENE ULTRA (HEMOSTASIS) ×4 IMPLANT
KIT BASIN OR (CUSTOM PROCEDURE TRAY) ×4 IMPLANT
KIT TURNOVER KIT B (KITS) ×4 IMPLANT
LIGASURE IMPACT 36 18CM CVD LR (INSTRUMENTS) IMPLANT
NS IRRIG 1000ML POUR BTL (IV SOLUTION) ×8 IMPLANT
PACK GENERAL/GYN (CUSTOM PROCEDURE TRAY) ×4 IMPLANT
PAD ABD 8X10 STRL (GAUZE/BANDAGES/DRESSINGS) ×48 IMPLANT
PAD ARMBOARD 7.5X6 YLW CONV (MISCELLANEOUS) ×4 IMPLANT
PENCIL SMOKE EVACUATOR (MISCELLANEOUS) ×4 IMPLANT
SPECIMEN JAR LARGE (MISCELLANEOUS) IMPLANT
SPONGE ABD ABTHERA ADVANCE (MISCELLANEOUS) ×4 IMPLANT
SPONGE LAP 18X18 X RAY DECT (DISPOSABLE) ×16 IMPLANT
SPONGE SURGIFOAM ABS GEL 100 (HEMOSTASIS) ×4 IMPLANT
STAPLER VISISTAT 35W (STAPLE) ×4 IMPLANT
SUCTION POOLE TIP (SUCTIONS) ×4 IMPLANT
SUT PDS AB 1 TP1 96 (SUTURE) IMPLANT
SUT PROLENE 2 0 MH 48 (SUTURE) ×4 IMPLANT
SUT SILK 2 0 SH CR/8 (SUTURE) IMPLANT
SUT SILK 2 0 TIES 10X30 (SUTURE) IMPLANT
SUT SILK 3 0 SH CR/8 (SUTURE) ×4 IMPLANT
SUT SILK 3 0 TIES 10X30 (SUTURE) ×4 IMPLANT
SUT VIC AB 3-0 SH 18 (SUTURE) IMPLANT
TAPE CLOTH SURG 6X10 WHT LF (GAUZE/BANDAGES/DRESSINGS) ×4 IMPLANT
TOWEL GREEN STERILE FF (TOWEL DISPOSABLE) ×4 IMPLANT
TOWEL OR 17X26 10 PK STRL BLUE (TOWEL DISPOSABLE) ×4 IMPLANT
TRAY FOLEY MTR SLVR 16FR STAT (SET/KITS/TRAYS/PACK) IMPLANT

## 2018-07-10 NOTE — ED Notes (Signed)
11th FFP stopped, 12th FFP started

## 2018-07-10 NOTE — ED Notes (Signed)
1st unit FFP started.

## 2018-07-10 NOTE — ED Notes (Signed)
10th RBCs started

## 2018-07-10 NOTE — ED Notes (Signed)
10th FFP complete

## 2018-07-10 NOTE — ED Notes (Signed)
3rd FFP complete

## 2018-07-10 NOTE — ED Notes (Signed)
7th RBC started

## 2018-07-10 NOTE — ED Notes (Signed)
8th RBC started

## 2018-07-10 NOTE — ED Notes (Signed)
1st Unit RBC started

## 2018-07-10 NOTE — ED Notes (Signed)
1st RBC stopped, 2nd Unit RBC started. Pt intubated with 7.5 ETT with color change, breath sounds bilaterally. 23 at the lip.

## 2018-07-10 NOTE — ED Notes (Signed)
14th RBCs stopped, 15th RBCs started

## 2018-07-10 NOTE — ED Notes (Signed)
15th FFP started

## 2018-07-10 NOTE — ED Notes (Signed)
15th FFP stopped

## 2018-07-10 NOTE — ED Notes (Signed)
Paged Urology for Dr Doylene Canard

## 2018-07-10 NOTE — ED Notes (Signed)
4th FFP started.  

## 2018-07-10 NOTE — ED Notes (Signed)
4th FFP stopped

## 2018-07-10 NOTE — ED Notes (Signed)
5th unit RBCs complete

## 2018-07-10 NOTE — ED Notes (Signed)
5th FFP 5th FFP started

## 2018-07-10 NOTE — ED Notes (Signed)
13th RBC started. 8th FFP complete

## 2018-07-10 NOTE — ED Notes (Signed)
3rd unit RBC started

## 2018-07-10 NOTE — ED Notes (Signed)
13th FFP stopped, 14th FFP started

## 2018-07-10 NOTE — ED Notes (Signed)
This RN gave update to pt's fiance in consultation room. Pt still receiving blood via MTP. Pt will go to IR shortly.
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
## 2018-07-10 NOTE — ED Notes (Signed)
20th RBCs stopped.

## 2018-07-10 NOTE — ED Notes (Signed)
9th RBCs started

## 2018-07-10 NOTE — ED Notes (Signed)
14th FFP stopped, 6th Liter NS started

## 2018-07-10 NOTE — ED Notes (Signed)
16th RBCs stopped.

## 2018-07-10 NOTE — ED Notes (Signed)
17th RBCs stopped, 18th RBCs started

## 2018-07-10 NOTE — Anesthesia Preprocedure Evaluation (Addendum)
Anesthesia Evaluation  Patient identified by MRN, date of birth, ID band Patient unresponsive  Preop documentation limited or incomplete due to emergent nature of procedure.  Airway Mallampati: Intubated       Dental   Pulmonary    breath sounds clear to auscultation       Cardiovascular  Rhythm:regular Rate:Tachycardia     Neuro/Psych    GI/Hepatic   Endo/Other    Renal/GU      Musculoskeletal   Abdominal   Peds  Hematology   Anesthesia Other Findings   Reproductive/Obstetrics                             Anesthesia Physical Anesthesia Plan  ASA: IV and emergent  Anesthesia Plan: General   Post-op Pain Management:    Induction: Intravenous  PONV Risk Score and Plan: 2 and Treatment may vary due to age or medical condition  Airway Management Planned: Oral ETT  Additional Equipment: Arterial line and CVP  Intra-op Plan:   Post-operative Plan: Post-operative intubation/ventilation  Informed Consent:     History available from chart only and Only emergency history available  Plan Discussed with: CRNA, Anesthesiologist and Surgeon  Anesthesia Plan Comments: (Pt arrived emergently to OR in unstable hemodynamic condition.)       Anesthesia Quick Evaluation

## 2018-07-10 NOTE — ED Notes (Signed)
12th FFP stopped

## 2018-07-10 NOTE — ED Notes (Signed)
1st FFP complete

## 2018-07-10 NOTE — ED Notes (Addendum)
3rd RBC complete

## 2018-07-10 NOTE — ED Notes (Signed)
5th FFP stopped

## 2018-07-10 NOTE — ED Notes (Signed)
4th unit rbcs complete

## 2018-07-10 NOTE — ED Notes (Signed)
10th RBCs stopped

## 2018-07-10 NOTE — ED Notes (Signed)
20th RBCs started

## 2018-07-10 NOTE — Progress Notes (Signed)
Pt transported from OR to 4N18 on vent without complications.
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
## 2018-07-10 NOTE — ED Notes (Signed)
5th Liter NS bolus started

## 2018-07-10 NOTE — ED Notes (Signed)
13th RBCs stopped

## 2018-07-10 NOTE — ED Notes (Signed)
2nd FFP started

## 2018-07-10 NOTE — ED Notes (Signed)
14th RBC started

## 2018-07-10 NOTE — ED Notes (Addendum)
4th L NS started. 5th unit RBCs started

## 2018-07-10 NOTE — ED Notes (Signed)
6th unit RBCs complete

## 2018-07-10 NOTE — ED Notes (Signed)
18th RBCs stopped, 13th FFP started

## 2018-07-10 NOTE — ED Notes (Signed)
9th RBCs stopped

## 2018-07-10 NOTE — ED Notes (Signed)
11th FFP started

## 2018-07-10 NOTE — ED Notes (Signed)
Platelets started. Pt taken to OR by this RN, Carmie Kanner RN, IR MD, and OR RN.

## 2018-07-10 NOTE — ED Notes (Signed)
3rd liter NS bolus started, BP 155/89, HR 104, 100% on vent

## 2018-07-10 NOTE — ED Notes (Signed)
16th ffp started

## 2018-07-10 NOTE — ED Notes (Signed)
2nd unit RBC stopped

## 2018-07-10 NOTE — ED Notes (Signed)
Pt is going to the OR, OR contacted, they are setting the room up, will call back shortly to let us know when they're ready.

## 2018-07-10 NOTE — ED Notes (Signed)
7th FFP started

## 2018-07-10 NOTE — ED Notes (Signed)
17th RBC started

## 2018-07-10 NOTE — ED Notes (Signed)
6th FFP started
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
## 2018-07-10 NOTE — ED Notes (Addendum)
12th RBC stopped. 8th FFP started

## 2018-07-10 NOTE — ED Notes (Signed)
12th RBCs started

## 2018-07-10 NOTE — ED Notes (Signed)
8th RBC stopped
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
## 2018-07-10 NOTE — ED Notes (Signed)
7th RBC stopped

## 2018-07-10 NOTE — ED Notes (Addendum)
9th FFP complete. 10th FFP started

## 2018-07-10 NOTE — ED Notes (Signed)
6th unit RBCs started

## 2018-07-10 NOTE — Transfer of Care (Signed)
Immediate Anesthesia Transfer of Care Note  Patient: Christopher [name]  Procedure(s) Performed: IR WITH ANESTHESIA (N/A ) WOUND EXPLORATION LEFT GROIN (Left Groin) Ir With Anesthesia (Right Abdomen) Exploratory Laparotomy (Abdomen) Application Of Wound Vac (Abdomen)  Patient Location: ICU  Anesthesia Type:General  Level of Consciousness: sedated and Patient remains intubated per anesthesia plan  Airway & Oxygen Therapy: Patient remains intubated per anesthesia plan and Patient placed on Ventilator (see vital sign flow sheet for setting)  Post-op Assessment: Report given to RN and Post -op Vital signs reviewed and stable  Post vital signs: Reviewed and stable  Last Vitals:  Vitals Value Taken Time  BP    Temp    Pulse 138 07/10/2018 11:51 PM  Resp 18 07/10/2018 11:56 PM  SpO2 99 % 07/10/2018 11:51 PM  Vitals shown include unvalidated device data.  Last Pain:  Vitals:   07/10/18 1746  TempSrc: Tympanic  PainSc:          Complications: No apparent anesthesia complications

## 2018-07-10 NOTE — ED Notes (Signed)
4th unit of blood started.
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
## 2018-07-10 NOTE — ED Notes (Addendum)
6th FFP stopped. 11th RBCs started. Fentanyl drip to and versed drip to 8mg 

## 2018-07-10 NOTE — ED Notes (Signed)
3rd FFP start

## 2018-07-10 NOTE — ED Notes (Signed)
11th RBCs stopped

## 2018-07-10 NOTE — ED Notes (Signed)
2nd fast negative by Dr Fredricka Bonine

## 2018-07-10 NOTE — ED Notes (Signed)
9th FFP started

## 2018-07-10 NOTE — ED Notes (Addendum)
15th RBCs stopped. 16th unit of RBCs started

## 2018-07-11 ENCOUNTER — Inpatient Hospital Stay (HOSPITAL_COMMUNITY): Payer: No Typology Code available for payment source

## 2018-07-11 ENCOUNTER — Encounter (HOSPITAL_COMMUNITY): Payer: Self-pay | Admitting: Interventional Radiology

## 2018-07-11 LAB — BPAM FFP
Blood Product Expiration Date: 202002012359
Blood Product Expiration Date: 202002012359
Blood Product Expiration Date: 202002012359
Blood Product Expiration Date: 202002022359
Blood Product Expiration Date: 202002022359
Blood Product Expiration Date: 202002022359
Blood Product Expiration Date: 202002022359
Blood Product Expiration Date: 202002022359
Blood Product Expiration Date: 202002022359
Blood Product Expiration Date: 202002022359
Blood Product Expiration Date: 202002022359
Blood Product Expiration Date: 202002022359
Blood Product Expiration Date: 202002022359
Blood Product Expiration Date: 202002022359
Blood Product Expiration Date: 202002022359
Blood Product Expiration Date: 202002022359
Blood Product Expiration Date: 202002022359
Blood Product Expiration Date: 202002022359
Blood Product Expiration Date: 202002022359
Blood Product Expiration Date: 202002022359
Blood Product Expiration Date: 202002022359
Blood Product Expiration Date: 202002022359
Blood Product Expiration Date: 202002022359
Blood Product Expiration Date: 202002022359
Blood Product Expiration Date: 202002022359
Blood Product Expiration Date: 202002022359
Blood Product Expiration Date: 202002022359
Blood Product Expiration Date: 202002022359
Blood Product Expiration Date: 202002022359
Blood Product Expiration Date: 202002022359
Blood Product Expiration Date: 202002022359
Blood Product Expiration Date: 202002022359
Blood Product Expiration Date: 202002022359
Blood Product Expiration Date: 202002082359
Blood Product Expiration Date: 202002082359
ISSUE DATE / TIME: 202001281727
ISSUE DATE / TIME: 202001281727
ISSUE DATE / TIME: 202001281730
ISSUE DATE / TIME: 202001281826
ISSUE DATE / TIME: 202001281826
ISSUE DATE / TIME: 202001281834
ISSUE DATE / TIME: 202001281834
ISSUE DATE / TIME: 202001281859
ISSUE DATE / TIME: 202001281859
ISSUE DATE / TIME: 202001281919
ISSUE DATE / TIME: 202001281919
ISSUE DATE / TIME: 202001281919
ISSUE DATE / TIME: 202001281919
ISSUE DATE / TIME: 202001281935
ISSUE DATE / TIME: 202001281935
ISSUE DATE / TIME: 202001281952
ISSUE DATE / TIME: 202001281952
ISSUE DATE / TIME: 202001281952
ISSUE DATE / TIME: 202001281952
ISSUE DATE / TIME: 202001282029
ISSUE DATE / TIME: 202001282029
ISSUE DATE / TIME: 202001282029
ISSUE DATE / TIME: 202001282029
ISSUE DATE / TIME: 202001282054
ISSUE DATE / TIME: 202001282054
ISSUE DATE / TIME: 202001282054
ISSUE DATE / TIME: 202001282054
ISSUE DATE / TIME: 202001290759
ISSUE DATE / TIME: 202001290759
ISSUE DATE / TIME: 202001290804
ISSUE DATE / TIME: 202001290804
Unit Type and Rh: 5100
Unit Type and Rh: 5100
Unit Type and Rh: 5100
Unit Type and Rh: 5100
Unit Type and Rh: 5100
Unit Type and Rh: 5100
Unit Type and Rh: 5100
Unit Type and Rh: 5100
Unit Type and Rh: 5100
Unit Type and Rh: 5100
Unit Type and Rh: 5100
Unit Type and Rh: 5100
Unit Type and Rh: 5100
Unit Type and Rh: 5100
Unit Type and Rh: 600
Unit Type and Rh: 6200
Unit Type and Rh: 6200
Unit Type and Rh: 6200
Unit Type and Rh: 6200
Unit Type and Rh: 6200
Unit Type and Rh: 6200
Unit Type and Rh: 6200
Unit Type and Rh: 6200
Unit Type and Rh: 6200
Unit Type and Rh: 6200
Unit Type and Rh: 6200
Unit Type and Rh: 6200
Unit Type and Rh: 6200
Unit Type and Rh: 6200
Unit Type and Rh: 6200
Unit Type and Rh: 6200
Unit Type and Rh: 6200
Unit Type and Rh: 6200
Unit Type and Rh: 9500
Unit Type and Rh: 9500

## 2018-07-11 LAB — POCT I-STAT 7, (LYTES, BLD GAS, ICA,H+H)
Acid-base deficit: 8 mmol/L — ABNORMAL HIGH (ref 0.0–2.0)
Acid-base deficit: 11 mmol/L — ABNORMAL HIGH (ref 0.0–2.0)
Acid-base deficit: 9 mmol/L — ABNORMAL HIGH (ref 0.0–2.0)
Acid-base deficit: 5 mmol/L — ABNORMAL HIGH (ref 0.0–2.0)
Acid-base deficit: 4 mmol/L — ABNORMAL HIGH (ref 0.0–2.0)
Bicarbonate: 20.2 mmol/L (ref 20.0–28.0)
Bicarbonate: 18.8 mmol/L — ABNORMAL LOW (ref 20.0–28.0)
Bicarbonate: 20.3 mmol/L (ref 20.0–28.0)
Bicarbonate: 25.2 mmol/L (ref 20.0–28.0)
Bicarbonate: 25.3 mmol/L (ref 20.0–28.0)
Calcium, Ion: 0.7 mmol/L — CL (ref 1.15–1.40)
Calcium, Ion: 0.37 mmol/L — CL (ref 1.15–1.40)
Calcium, Ion: 0.55 mmol/L — CL (ref 1.15–1.40)
Calcium, Ion: 0.91 mmol/L — ABNORMAL LOW (ref 1.15–1.40)
Calcium, Ion: 1.02 mmol/L — ABNORMAL LOW (ref 1.15–1.40)
HCT: 16 % — ABNORMAL LOW (ref 39.0–52.0)
HCT: 19 % — ABNORMAL LOW (ref 39.0–52.0)
HCT: 28 % — ABNORMAL LOW (ref 39.0–52.0)
HCT: 29 % — ABNORMAL LOW (ref 39.0–52.0)
HCT: 28 % — ABNORMAL LOW (ref 39.0–52.0)
Hemoglobin: 5.4 g/dL — CL (ref 13.0–17.0)
Hemoglobin: 6.5 g/dL — CL (ref 13.0–17.0)
Hemoglobin: 9.5 g/dL — ABNORMAL LOW (ref 13.0–17.0)
Hemoglobin: 9.9 g/dL — ABNORMAL LOW (ref 13.0–17.0)
Hemoglobin: 9.5 g/dL — ABNORMAL LOW (ref 13.0–17.0)
O2 Saturation: 100 %
O2 Saturation: 100 %
O2 Saturation: 70 %
O2 Saturation: 92 %
O2 Saturation: 82 %
Patient temperature: 36.4
Patient temperature: 36.1
Patient temperature: 36.8
Patient temperature: 35.6
Patient temperature: 35.4
Potassium: 4.7 mmol/L (ref 3.5–5.1)
Potassium: 4.3 mmol/L (ref 3.5–5.1)
Potassium: 4.4 mmol/L (ref 3.5–5.1)
Potassium: 4.1 mmol/L (ref 3.5–5.1)
Potassium: 3.2 mmol/L — ABNORMAL LOW (ref 3.5–5.1)
Sodium: 147 mmol/L — ABNORMAL HIGH (ref 135–145)
Sodium: 147 mmol/L — ABNORMAL HIGH (ref 135–145)
Sodium: 148 mmol/L — ABNORMAL HIGH (ref 135–145)
Sodium: 148 mmol/L — ABNORMAL HIGH (ref 135–145)
Sodium: 148 mmol/L — ABNORMAL HIGH (ref 135–145)
TCO2: 22 mmol/L (ref 22–32)
TCO2: 20 mmol/L — ABNORMAL LOW (ref 22–32)
TCO2: 23 mmol/L (ref 22–32)
TCO2: 27 mmol/L (ref 22–32)
TCO2: 27 mmol/L (ref 22–32)
pCO2 arterial: 56.2 mmHg — ABNORMAL HIGH (ref 32.0–48.0)
pCO2 arterial: 48.6 mmHg — ABNORMAL HIGH (ref 32.0–48.0)
pCO2 arterial: 73.4 mmHg (ref 32.0–48.0)
pCO2 arterial: 70 mmHg (ref 32.0–48.0)
pCO2 arterial: 63.9 mmHg — ABNORMAL HIGH (ref 32.0–48.0)
pH, Arterial: 7.16 — CL (ref 7.350–7.450)
pH, Arterial: 7.044 — CL (ref 7.350–7.450)
pH, Arterial: 7.193 — CL (ref 7.350–7.450)
pH, Arterial: 7.156 — CL (ref 7.350–7.450)
pH, Arterial: 7.196 — CL (ref 7.350–7.450)
pO2, Arterial: 415 mmHg — ABNORMAL HIGH (ref 83.0–108.0)
pO2, Arterial: 358 mmHg — ABNORMAL HIGH (ref 83.0–108.0)
pO2, Arterial: 51 mmHg — ABNORMAL LOW (ref 83.0–108.0)
pO2, Arterial: 77 mmHg — ABNORMAL LOW (ref 83.0–108.0)
pO2, Arterial: 53 mmHg — ABNORMAL LOW (ref 83.0–108.0)

## 2018-07-11 LAB — CBC
HCT: 23.3 % — ABNORMAL LOW (ref 39.0–52.0)
HCT: 23.7 % — ABNORMAL LOW (ref 39.0–52.0)
HCT: 29.8 % — ABNORMAL LOW (ref 39.0–52.0)
Hemoglobin: 7.8 g/dL — ABNORMAL LOW (ref 13.0–17.0)
Hemoglobin: 8.2 g/dL — ABNORMAL LOW (ref 13.0–17.0)
Hemoglobin: 9.8 g/dL — ABNORMAL LOW (ref 13.0–17.0)
MCH: 28.7 pg (ref 26.0–34.0)
MCH: 29.1 pg (ref 26.0–34.0)
MCH: 30 pg (ref 26.0–34.0)
MCHC: 32.9 g/dL (ref 30.0–36.0)
MCHC: 33.5 g/dL (ref 30.0–36.0)
MCHC: 34.6 g/dL (ref 30.0–36.0)
MCV: 86.8 fL (ref 80.0–100.0)
MCV: 86.9 fL (ref 80.0–100.0)
MCV: 87.1 fL (ref 80.0–100.0)
Platelets: 63 10*3/uL — ABNORMAL LOW (ref 150–400)
Platelets: 73 10*3/uL — ABNORMAL LOW (ref 150–400)
Platelets: 77 10*3/uL — ABNORMAL LOW (ref 150–400)
RBC: 2.68 MIL/uL — ABNORMAL LOW (ref 4.22–5.81)
RBC: 2.73 MIL/uL — ABNORMAL LOW (ref 4.22–5.81)
RBC: 3.42 MIL/uL — ABNORMAL LOW (ref 4.22–5.81)
RDW: 14.6 % (ref 11.5–15.5)
RDW: 14.7 % (ref 11.5–15.5)
RDW: 14.7 % (ref 11.5–15.5)
WBC: 5.8 10*3/uL (ref 4.0–10.5)
WBC: 6.5 10*3/uL (ref 4.0–10.5)
WBC: 6.7 10*3/uL (ref 4.0–10.5)
nRBC: 0 % (ref 0.0–0.2)
nRBC: 0 % (ref 0.0–0.2)
nRBC: 0 % (ref 0.0–0.2)

## 2018-07-11 LAB — PREPARE PLATELET PHERESIS
Unit division: 0
Unit division: 0
Unit division: 0

## 2018-07-11 LAB — PREPARE FRESH FROZEN PLASMA
Unit division: 0
Unit division: 0
Unit division: 0
Unit division: 0
Unit division: 0
Unit division: 0
Unit division: 0
Unit division: 0
Unit division: 0
Unit division: 0
Unit division: 0
Unit division: 0
Unit division: 0
Unit division: 0
Unit division: 0
Unit division: 0
Unit division: 0
Unit division: 0
Unit division: 0
Unit division: 0
Unit division: 0
Unit division: 0
Unit division: 0
Unit division: 0
Unit division: 0
Unit division: 0
Unit division: 0
Unit division: 0
Unit division: 0

## 2018-07-11 LAB — LACTIC ACID, PLASMA: Lactic Acid, Venous: 2.7 mmol/L (ref 0.5–1.9)

## 2018-07-11 LAB — BPAM PLATELET PHERESIS
Blood Product Expiration Date: 202001302359
Blood Product Expiration Date: 202001302359
Blood Product Expiration Date: 202001302359
ISSUE DATE / TIME: 202001281833
ISSUE DATE / TIME: 202001281920
ISSUE DATE / TIME: 202001282023
Unit Type and Rh: 5100
Unit Type and Rh: 6200
Unit Type and Rh: 7300

## 2018-07-11 LAB — CBC WITH DIFFERENTIAL/PLATELET
Abs Immature Granulocytes: 0 10*3/uL (ref 0.00–0.07)
Basophils Absolute: 0 10*3/uL (ref 0.0–0.1)
Basophils Relative: 0 %
Eosinophils Absolute: 0 10*3/uL (ref 0.0–0.5)
Eosinophils Relative: 0 %
HCT: 19.6 % — ABNORMAL LOW (ref 39.0–52.0)
Hemoglobin: 6.5 g/dL — CL (ref 13.0–17.0)
Lymphocytes Relative: 14 %
Lymphs Abs: 0.7 10*3/uL (ref 0.7–4.0)
MCH: 29 pg (ref 26.0–34.0)
MCHC: 33.2 g/dL (ref 30.0–36.0)
MCV: 87.5 fL (ref 80.0–100.0)
Monocytes Absolute: 0.1 10*3/uL (ref 0.1–1.0)
Monocytes Relative: 2 %
Myelocytes: 1 %
Neutro Abs: 4 10*3/uL (ref 1.7–7.7)
Neutrophils Relative %: 83 %
Platelets: 121 10*3/uL — ABNORMAL LOW (ref 150–400)
RBC: 2.24 MIL/uL — ABNORMAL LOW (ref 4.22–5.81)
RDW: 14.7 % (ref 11.5–15.5)
WBC: 4.8 10*3/uL (ref 4.0–10.5)
nRBC: 0 % (ref 0.0–0.2)
nRBC: 0 /100 WBC

## 2018-07-11 LAB — PROTIME-INR
INR: 1.39
INR: 1.52
Prothrombin Time: 16.9 seconds — ABNORMAL HIGH (ref 11.4–15.2)
Prothrombin Time: 18.1 seconds — ABNORMAL HIGH (ref 11.4–15.2)

## 2018-07-11 LAB — COMPREHENSIVE METABOLIC PANEL
ALT: 32 U/L (ref 0–44)
AST: 62 U/L — ABNORMAL HIGH (ref 15–41)
Albumin: 2 g/dL — ABNORMAL LOW (ref 3.5–5.0)
Alkaline Phosphatase: 29 U/L — ABNORMAL LOW (ref 38–126)
Anion gap: 12 (ref 5–15)
BUN: 16 mg/dL (ref 6–20)
CO2: 23 mmol/L (ref 22–32)
Calcium: 6.7 mg/dL — ABNORMAL LOW (ref 8.9–10.3)
Chloride: 112 mmol/L — ABNORMAL HIGH (ref 98–111)
Creatinine, Ser: 1.03 mg/dL (ref 0.61–1.24)
GFR calc Af Amer: >60 mL/min (ref >60)
GFR calc non Af Amer: >60 mL/min (ref >60)
Glucose, Bld: 173 mg/dL — ABNORMAL HIGH (ref 70–99)
Potassium: 3.4 mmol/L — ABNORMAL LOW (ref 3.5–5.1)
Sodium: 147 mmol/L — ABNORMAL HIGH (ref 135–145)
Total Bilirubin: 1.8 mg/dL — ABNORMAL HIGH (ref 0.3–1.2)
Total Protein: 3.6 g/dL — ABNORMAL LOW (ref 6.5–8.1)

## 2018-07-11 LAB — MRSA PCR SCREENING: MRSA by PCR: NEGATIVE

## 2018-07-11 LAB — BLOOD PRODUCT ORDER (VERBAL) VERIFICATION

## 2018-07-11 LAB — APTT: aPTT: 39 seconds — ABNORMAL HIGH (ref 24–36)

## 2018-07-11 LAB — FIBRINOGEN: Fibrinogen: 233 mg/dL (ref 210–475)

## 2018-07-11 LAB — PREPARE RBC (CROSSMATCH)

## 2018-07-11 LAB — HIV ANTIBODY (ROUTINE TESTING W REFLEX): HIV Screen 4th Generation wRfx: NONREACTIVE

## 2018-07-11 LAB — TRIGLYCERIDES: Triglycerides: 51 mg/dL (ref <150)

## 2018-07-11 MED ORDER — METRONIDAZOLE IN NACL 5-0.79 MG/ML-% IV SOLN
500.0000 mg | Freq: Three times a day (TID) | INTRAVENOUS | Status: DC
  Administered 2018-07-11 – 2018-07-17 (×18): 500 mg via INTRAVENOUS
  Filled 2018-07-11 (×17): qty 100

## 2018-07-11 MED ORDER — SODIUM CHLORIDE 0.9% IV SOLUTION: Freq: Once | INTRAVENOUS | Status: DC

## 2018-07-11 MED ORDER — ORAL CARE MOUTH RINSE
15.0000 mL | OROMUCOSAL | Status: DC
  Administered 2018-07-11 – 2018-10-16 (×949): 15 mL via OROMUCOSAL

## 2018-07-11 MED ORDER — SODIUM CHLORIDE 0.9 % IV SOLN
2.0000 g | INTRAVENOUS | Status: DC
  Administered 2018-07-11 – 2018-07-16 (×6): 2 g via INTRAVENOUS
  Filled 2018-07-11 (×7): qty 20

## 2018-07-11 MED ORDER — IOHEXOL 300 MG/ML  SOLN
100.0000 mL | Freq: Once | INTRAMUSCULAR | Status: AC | PRN
  Administered 2018-07-11: 100 mL via INTRAVENOUS

## 2018-07-11 MED ORDER — CHLORHEXIDINE GLUCONATE 0.12% ORAL RINSE (MEDLINE KIT)
15.0000 mL | Freq: Two times a day (BID) | OROMUCOSAL | Status: DC
  Administered 2018-07-11 – 2018-10-16 (×191): 15 mL via OROMUCOSAL

## 2018-07-11 MED ORDER — PROPOFOL 1000 MG/100ML IV EMUL
0.0000 ug/kg/min | INTRAVENOUS | Status: DC
  Administered 2018-07-11 (×2): 20 ug/kg/min via INTRAVENOUS
  Administered 2018-07-12: 40 ug/kg/min via INTRAVENOUS
  Administered 2018-07-12: 20 ug/kg/min via INTRAVENOUS
  Administered 2018-07-13: 30 ug/kg/min via INTRAVENOUS
  Administered 2018-07-13: 40 ug/kg/min via INTRAVENOUS
  Administered 2018-07-13: 30 ug/kg/min via INTRAVENOUS
  Administered 2018-07-14 (×2): 40 ug/kg/min via INTRAVENOUS
  Administered 2018-07-14: 30 ug/kg/min via INTRAVENOUS
  Administered 2018-07-14: 50 ug/kg/min via INTRAVENOUS
  Administered 2018-07-15: 35 ug/kg/min via INTRAVENOUS
  Administered 2018-07-15 (×4): 50 ug/kg/min via INTRAVENOUS
  Administered 2018-07-16: 40 ug/kg/min via INTRAVENOUS
  Administered 2018-07-16: 50 ug/kg/min via INTRAVENOUS
  Administered 2018-07-16: 40 ug/kg/min via INTRAVENOUS
  Administered 2018-07-16: 50 ug/kg/min via INTRAVENOUS
  Administered 2018-07-16: 40 ug/kg/min via INTRAVENOUS
  Administered 2018-07-17 – 2018-07-23 (×31): 50 ug/kg/min via INTRAVENOUS
  Administered 2018-07-24 (×2): 30 ug/kg/min via INTRAVENOUS
  Administered 2018-07-24 (×3): 50 ug/kg/min via INTRAVENOUS
  Administered 2018-07-25: 17:00:00 via INTRAVENOUS
  Administered 2018-07-25: 35 ug/kg/min via INTRAVENOUS
  Administered 2018-07-25: 40 ug/kg/min via INTRAVENOUS
  Administered 2018-07-26: 30 ug/kg/min via INTRAVENOUS
  Filled 2018-07-11 (×12): qty 100
  Filled 2018-07-11: qty 200
  Filled 2018-07-11 (×6): qty 100
  Filled 2018-07-11: qty 200
  Filled 2018-07-11 (×43): qty 100

## 2018-07-11 MED ORDER — CALCIUM GLUCONATE-NACL 2-0.675 GM/100ML-% IV SOLN
2.0000 g | Freq: Once | INTRAVENOUS | Status: AC
  Administered 2018-07-11: 2000 mg via INTRAVENOUS
  Filled 2018-07-11: qty 100

## 2018-07-11 NOTE — Progress Notes (Signed)
WC Case Manager on floor, requesting clinical information on patient.  Unable to give information without pt/family consent.  Spoke with [name], pt's fiance and decision maker:  She states she is not comfortable giving medical information to [facility] Case Manager at this time.  She is agreeable to meeting with her, and plans to be back on floor in next 15-20 minutes, as she has gone out to get some air. WC Case Mgr made aware; she states she will wait to speak with fiance.    Worker's Comp Case Manager Info:  Laretta Bolster, RN, BSN, CCM, QRP Case Manager [employer] [email or web address] [phone] Fax:  [phone]  Quintella Baton, RN, BSN  Trauma/Neuro ICU Case Manager [phone]

## 2018-07-11 NOTE — Procedures (Signed)
Right 5 french sheath removal. No hematoma present prior to sheath pull.  Held manual pressure with V pad for 15 minutes. Tegaderm and gauze applied, along with new biopatch for central line.  No complications.  Verified site with [facility] RN.  Lynann Beaver RT R, VIR Elam Dutch RT R
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
## 2018-07-11 NOTE — Progress Notes (Addendum)
07/11/18 1000  Clinical Encounter Type  Visited With Patient;Family  Visit Type Initial  Referral From Chaplain  Consult/Referral To Chaplain  Spiritual Encounters  Spiritual Needs Emotional;Grief support  Stress Factors  Family Stress Factors Health changes;Major life changes;Exhausted   Evening Chaplains care for PT is being continued by me. I visited PT who was unresponsive. Doctor was performing procedure. Nurse stated PT fiance just left. I met Melonie (Fiance) in sitting room with her brother. She was distraught and I offered her empathetic listening. I offered ministry of presence, a listening ear and will follow up later today. Doctor came to her a informed her of the coming procedures that are most likely going to happen within a couple of days. I will follow up with her in the afternoon.   Chaplain Fidel Levy [phone]
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
## 2018-07-11 NOTE — Progress Notes (Signed)
ABG results from G3 cartridge that will not cross over. Remaining G3 cartridges discarded per recall notice.  pH        7.33 CO2     45.2 pO2      186 HCO3   25.1

## 2018-07-11 NOTE — Progress Notes (Signed)
Orthopedic Tech Progress Note Patient Details:  Christopher [name] Nov 05, 1965 [id number]  Musculoskeletal Traction Type of Traction: Skeletal (Balanced Suspension) Traction Weight: 30 lbs   Post Interventions Patient Tolerated: Well   [name] 07/11/2018, 10:18 AM
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
## 2018-07-11 NOTE — Progress Notes (Signed)
Attempted to revisit PT and fiance. Fiance was not on room. Nurse stated that fiance (Melonie) just left and not sure when she will be back. Chaplain available upon request.  Chaplain Orest Dikes  [phone]

## 2018-07-11 NOTE — Consult Note (Signed)
Orthopaedic Trauma Service (OTS) Consult   Patient ID: Christopher [name] MRN: [id number] DOB/AGE: 52-Apr-1967 52 y.o.    Reason for Consult: Complex pelvic ring fractures and left hemipelvis dislocation, left femoral neck fracture Referring Physician: Durene Romans, MD, orthopedics   HPI: Christopher [name] is an 53 y.o. White male who was injured yesterday evening while at work.  He and a coworker were doing something on a large equipment trailer when it somehow backed over him.  Patient was brought in as a level 1 trauma activation.  Complained of severe left-sided flank and leg pain and was unable to move his legs.  Initial blood pressures on the scene were noted to be with systolic pressures in the 70s and went up to 90's after  2 liters of crystalloids.  O2 sats were in the 80s high 90s with nasal cannula.  Significant soft tissue injury noted to his left groin and scrotum and left thigh.  His past work-up was negative in the OR.  Massive transfusion protocol was initiated on arrival due to severe hemorrhagic shock.  Per general surgery note patient received around 50 units of product prior to going to the operating room and then received 20 more in the OR.  Patient was taken to the OR to further assess his hemorrhage and to get control of his hemorrhage.  Patient was also evaluated by interventional radiology as well.  Initially pt had a pelvic binder in place. Pt had pelvic arteriogram in OR. Preliminary findings were notable for thrombosed L internal iliac artery. The R internal artery was coiled.  See op note.  No arterial extravasation was noted from L thigh as well. Suprapubic catheter placed as well as foley balloon was not in bladder on [place] eval.   Post op dx include unstable plaque ring fractures with shearing of the left hemipelvis, multifocal venous hemorrhage, abdominal compartment syndrome, severe soft tissue wounds to left groin, perineum and left thigh  Due to  the complexity of patient's injuries it was felt that patient would be best treated by an fellowship trained orthopedic traumatologist with respect to his orthopedic injuries  Patient seen and evaluated in the trauma ICU.  He is intubated and sedated fianc is at bedside  Unable to obtain detailed medical, surgical and social history    History reviewed. No pertinent past medical history.  History reviewed. No pertinent surgical history.  History reviewed. No pertinent family history.  Social History:  reports that he does not drink alcohol or use drugs. No history on file for tobacco.  Allergies: No Known Allergies  Medications: I have reviewed the patient's current medications.  Results for orders placed or performed during the hospital encounter of 07/10/18 (from the past 48 hour(s))  Prepare fresh frozen plasma     Status: None (Preliminary result)   Collection Time: 07/10/18  5:20 PM  Result Value Ref Range   Unit Number [id number]    Blood Component Type LIQ PLASMA    Unit division 00    Status of Unit QUARANTINED    Unit tag comment EMERGENCY RELEASE    Transfusion Status OK TO TRANSFUSE    Unit Number [id number]    Blood Component Type LIQ PLASMA    Unit division 00    Status of Unit ISSUED    Unit tag comment EMERGENCY RELEASE    Transfusion Status OK TO TRANSFUSE    Unit Number [id number]    Blood Component Type LIQ  PLASMA    Unit division 00    Status of Unit ISSUED    Unit tag comment EMERGENCY RELEASE    Transfusion Status OK TO TRANSFUSE    Unit Number [id number]    Blood Component Type THAWED PLASMA    Unit division 00    Status of Unit ISSUED    Transfusion Status OK TO TRANSFUSE    Unit Number [id number]    Blood Component Type THAWED PLASMA    Unit division 00    Status of Unit ISSUED    Transfusion Status OK TO TRANSFUSE    Unit Number [id number]    Blood Component Type THAWED PLASMA    Unit division 00    Status of Unit  ISSUED    Transfusion Status OK TO TRANSFUSE    Unit Number [id number]    Blood Component Type THAWED PLASMA    Unit division 00    Status of Unit ISSUED    Transfusion Status OK TO TRANSFUSE    Unit Number [id number]    Blood Component Type THW PLS APHR    Unit division B0    Status of Unit ISSUED    Transfusion Status OK TO TRANSFUSE    Unit Number [id number]    Blood Component Type THW PLS APHR    Unit division B0    Status of Unit ISSUED    Transfusion Status OK TO TRANSFUSE    Unit Number [id number]    Blood Component Type THAWED PLASMA    Unit division 00    Status of Unit ISSUED    Transfusion Status OK TO TRANSFUSE    Unit Number [id number]    Blood Component Type THAWED PLASMA    Unit division 00    Status of Unit ISSUED    Transfusion Status OK TO TRANSFUSE    Unit Number [id number]    Blood Component Type THAWED PLASMA    Unit division 00    Status of Unit ISSUED    Transfusion Status OK TO TRANSFUSE    Unit Number [id number]    Blood Component Type THAWED PLASMA    Unit division 00    Status of Unit ISSUED    Transfusion Status OK TO TRANSFUSE    Unit Number [id number]    Blood Component Type THAWED PLASMA    Unit division 00    Status of Unit ISSUED    Transfusion Status OK TO TRANSFUSE    Unit Number [id number]    Blood Component Type THW PLS APHR    Unit division B0    Status of Unit ISSUED    Transfusion Status OK TO TRANSFUSE    Unit Number [id number]    Blood Component Type THW PLS APHR    Unit division B0    Status of Unit ISSUED    Transfusion Status OK TO TRANSFUSE    Unit Number [id number]    Blood Component Type THAWED PLASMA    Unit division 00    Status of Unit ISSUED    Transfusion Status OK TO TRANSFUSE    Unit Number [id number]    Blood Component Type THAWED PLASMA    Unit division 00    Status of Unit ISSUED    Transfusion Status OK TO TRANSFUSE    Unit Number [id number]    Blood  Component Type THAWED PLASMA    Unit division 00    Status of Unit ISSUED    Transfusion Status OK TO TRANSFUSE    Unit  Number [id number]    Blood Component Type THAWED PLASMA    Unit division 00    Status of Unit ISSUED    Transfusion Status OK TO TRANSFUSE    Unit Number [id number]    Blood Component Type THW PLS APHR    Unit division 00    Status of Unit ISSUED    Transfusion Status OK TO TRANSFUSE    Unit Number [id number]    Blood Component Type THAWED PLASMA    Unit division 00    Status of Unit ISSUED    Transfusion Status OK TO TRANSFUSE    Unit Number [id number]    Blood Component Type THAWED PLASMA    Unit division 00    Status of Unit ISSUED    Transfusion Status OK TO TRANSFUSE    Unit Number [id number]    Blood Component Type THW PLS APHR    Unit division A0    Status of Unit REL FROM [facility]    Transfusion Status OK TO TRANSFUSE    Unit Number [id number]    Blood Component Type THAWED PLASMA    Unit division 00    Status of Unit REL FROM [facility]    Transfusion Status OK TO TRANSFUSE    Unit Number [id number]    Blood Component Type THAWED PLASMA    Unit division 00    Status of Unit REL FROM [facility]    Transfusion Status OK TO TRANSFUSE    Unit Number [id number]    Blood Component Type THW PLS APHR    Unit division B0    Status of Unit REL FROM [facility]    Transfusion Status OK TO TRANSFUSE    Unit Number [id number]    Blood Component Type THAWED PLASMA    Unit division 00    Status of Unit REL FROM [facility]    Transfusion Status OK TO TRANSFUSE    Unit Number [id number]    Blood Component Type THAWED PLASMA    Unit division 00    Status of Unit REL FROM [facility]    Transfusion Status OK TO TRANSFUSE    Unit Number [id number]    Blood Component Type THAWED PLASMA    Unit division 00    Status of Unit REL FROM [facility]    Transfusion Status OK TO TRANSFUSE    Unit Number [id number]    Blood Component Type THAWED PLASMA     Unit division 00    Status of Unit REL FROM [facility]    Transfusion Status OK TO TRANSFUSE    Unit Number [id number]    Blood Component Type THAWED PLASMA    Unit division 00    Status of Unit ALLOCATED    Transfusion Status OK TO TRANSFUSE    Unit Number [id number]    Blood Component Type THAWED PLASMA    Unit division 00    Status of Unit ALLOCATED    Transfusion Status OK TO TRANSFUSE    Unit Number [id number]    Blood Component Type THAWED PLASMA    Unit division 00    Status of Unit ALLOCATED    Transfusion Status OK TO TRANSFUSE    Unit Number [id number]    Blood Component Type THAWED PLASMA    Unit division 00    Status of Unit ALLOCATED    Transfusion Status OK TO TRANSFUSE   Type and screen Ordered by PROVIDER DEFAULT     Status: None (Preliminary result)   Collection Time: 07/10/18  5:54  PM  Result Value Ref Range   ABO/RH(D) O POS    Antibody Screen NEG    Sample Expiration 07/13/2018    Unit Number [id number]    Blood Component Type RED CELLS,LR    Unit division 00    Status of Unit ISSUED    Unit tag comment EMERGENCY RELEASE    Transfusion Status OK TO TRANSFUSE    Crossmatch Result COMPATIBLE    Unit Number [id number]    Blood Component Type RBC LR PHER1    Unit division 00    Status of Unit ISSUED    Unit tag comment EMERGENCY RELEASE    Transfusion Status OK TO TRANSFUSE    Crossmatch Result COMPATIBLE    Unit Number [id number]    Blood Component Type RED CELLS,LR    Unit division 00    Status of Unit ISSUED    Unit tag comment EMERGENCY RELEASE    Transfusion Status OK TO TRANSFUSE    Crossmatch Result COMPATIBLE    Unit Number [id number]    Blood Component Type RED CELLS,LR    Unit division 00    Status of Unit ISSUED    Unit tag comment EMERGENCY RELEASE    Transfusion Status OK TO TRANSFUSE    Crossmatch Result COMPATIBLE    Unit Number [id number]    Blood Component Type RED CELLS,LR    Unit division 00    Status  of Unit ISSUED    Unit tag comment VERBAL ORDERS PER DR PFFEIFFER    Transfusion Status OK TO TRANSFUSE    Crossmatch Result COMPATIBLE    Unit Number [id number]    Blood Component Type RED CELLS,LR    Unit division 00    Status of Unit ISSUED    Unit tag comment VERBAL ORDERS PER DR PFEIFFER    Transfusion Status OK TO TRANSFUSE    Crossmatch Result COMPATIBLE    Unit Number [id number]    Blood Component Type RED CELLS,LR    Unit division 00    Status of Unit ISSUED    Unit tag comment VERBAL ORDERS PER DR PFEIFFER    Transfusion Status OK TO TRANSFUSE    Crossmatch Result COMPATIBLE    Unit Number [id number]    Blood Component Type RED CELLS,LR    Unit division 00    Status of Unit ISSUED    Unit tag comment VERBAL ORDERS PER DR PFEIFFER    Transfusion Status OK TO TRANSFUSE    Crossmatch Result COMPATIBLE    Unit Number [id number]    Blood Component Type RED CELLS,LR    Unit division 00    Status of Unit ISSUED    Transfusion Status OK TO TRANSFUSE    Crossmatch Result Compatible    Unit Number [id number]    Blood Component Type RED CELLS,LR    Unit division 00    Status of Unit ISSUED    Transfusion Status OK TO TRANSFUSE    Crossmatch Result Compatible    Unit Number [id number]    Blood Component Type RED CELLS,LR    Unit division 00    Status of Unit ISSUED    Transfusion Status OK TO TRANSFUSE    Crossmatch Result Compatible    Unit Number [id number]    Blood Component Type RED CELLS,LR    Unit division 00    Status of Unit ISSUED    Transfusion Status OK TO TRANSFUSE    Crossmatch Result Compatible    Unit Number [id number]  Blood Component Type RED CELLS,LR    Unit division 00    Status of Unit ISSUED    Transfusion Status OK TO TRANSFUSE    Crossmatch Result Compatible    Unit Number [id number]    Blood Component Type RED CELLS,LR    Unit division 00    Status of Unit ISSUED    Transfusion Status OK TO TRANSFUSE     Crossmatch Result Compatible    Unit Number [id number]    Blood Component Type RED CELLS,LR    Unit division 00    Status of Unit ISSUED    Transfusion Status OK TO TRANSFUSE    Crossmatch Result Compatible    Unit Number [id number]    Blood Component Type RED CELLS,LR    Unit division 00    Status of Unit ISSUED    Transfusion Status OK TO TRANSFUSE    Crossmatch Result Compatible    Unit Number [id number]    Blood Component Type RED CELLS,LR    Unit division 00    Status of Unit ISSUED    Transfusion Status OK TO TRANSFUSE    Crossmatch Result Compatible    Unit Number [id number]    Blood Component Type RED CELLS,LR    Unit division 00    Status of Unit ISSUED    Transfusion Status OK TO TRANSFUSE    Crossmatch Result Compatible    Unit Number [id number]    Blood Component Type RED CELLS,LR    Unit division 00    Status of Unit ISSUED    Transfusion Status OK TO TRANSFUSE    Crossmatch Result Compatible    Unit Number [id number]    Blood Component Type RED CELLS,LR    Unit division 00    Status of Unit ISSUED    Transfusion Status OK TO TRANSFUSE    Crossmatch Result Compatible    Unit Number [id number]    Blood Component Type RED CELLS,LR    Unit division 00    Status of Unit ISSUED    Transfusion Status OK TO TRANSFUSE    Crossmatch Result Compatible    Unit Number [id number]    Blood Component Type RBC LR PHER2    Unit division 00    Status of Unit ISSUED    Transfusion Status OK TO TRANSFUSE    Crossmatch Result Compatible    Unit Number [id number]    Blood Component Type RED CELLS,LR    Unit division 00    Status of Unit ISSUED    Transfusion Status OK TO TRANSFUSE    Crossmatch Result Compatible    Unit Number [id number]    Blood Component Type RBC LR PHER1    Unit division 00    Status of Unit ISSUED    Transfusion Status OK TO TRANSFUSE    Crossmatch Result Compatible    Unit Number [id number]    Blood Component  Type RED CELLS,LR    Unit division 00    Status of Unit ISSUED    Transfusion Status OK TO TRANSFUSE    Crossmatch Result Compatible    Unit Number [id number]    Blood Component Type RED CELLS,LR    Unit division 00    Status of Unit ISSUED    Transfusion Status OK TO TRANSFUSE    Crossmatch Result Compatible    Unit Number [id number]    Blood Component Type RED CELLS,LR    Unit division 00    Status of Unit ISSUED    Transfusion  Status OK TO TRANSFUSE    Crossmatch Result Compatible    Unit Number [id number]    Blood Component Type RBC LR PHER2    Unit division 00    Status of Unit ISSUED    Transfusion Status OK TO TRANSFUSE    Crossmatch Result Compatible    Unit Number [id number]    Blood Component Type RED CELLS,LR    Unit division 00    Status of Unit ISSUED    Transfusion Status OK TO TRANSFUSE    Crossmatch Result Compatible    Unit Number [id number]    Blood Component Type RED CELLS,LR    Unit division 00    Status of Unit ISSUED    Transfusion Status OK TO TRANSFUSE    Crossmatch Result Compatible    Unit Number [id number]    Blood Component Type RED CELLS,LR    Unit division 00    Status of Unit REL FROM [facility]    Transfusion Status OK TO TRANSFUSE    Crossmatch Result Compatible    Unit Number [id number]    Blood Component Type RED CELLS,LR    Unit division 00    Status of Unit REL FROM [facility]    Transfusion Status OK TO TRANSFUSE    Crossmatch Result Compatible    Unit Number [id number]    Blood Component Type RBC LR PHER1    Unit division 00    Status of Unit REL FROM [facility]    Transfusion Status OK TO TRANSFUSE    Crossmatch Result Compatible    Unit Number [id number]    Blood Component Type RBC LR PHER2    Unit division 00    Status of Unit REL FROM [facility]    Transfusion Status OK TO TRANSFUSE    Crossmatch Result Compatible    Unit Number [id number]    Blood Component Type RED CELLS,LR    Unit division 00     Status of Unit ALLOCATED    Transfusion Status OK TO TRANSFUSE    Crossmatch Result Compatible    Unit Number [id number]    Blood Component Type RBC LR PHER1    Unit division 00    Status of Unit ISSUED    Transfusion Status OK TO TRANSFUSE    Crossmatch Result Compatible    Unit Number [id number]    Blood Component Type RBC LR PHER2    Unit division 00    Status of Unit ALLOCATED    Transfusion Status OK TO TRANSFUSE    Crossmatch Result Compatible    Unit Number [id number]    Blood Component Type RBC LR PHER2    Unit division 00    Status of Unit ISSUED    Transfusion Status OK TO TRANSFUSE    Crossmatch Result      Compatible Performed at [facility], [address] [place], [place] [address]   ABO/Rh     Status: None   Collection Time: 07/10/18  5:54 PM  Result Value Ref Range   ABO/RH(D)      O POS Performed at [facility], [address] [place], [place] [address]   CDS serology     Status: None   Collection Time: 07/10/18  6:13 PM  Result Value Ref Range   CDS serology specimen      SPECIMEN WILL BE HELD FOR 14 DAYS IF TESTING IS REQUIRED    Comment: Performed at [facility], [address], [place], [place] [address]  Comprehensive metabolic panel     Status: Abnormal   Collection Time: 07/10/18  6:13 PM  Result Value Ref Range   Sodium 140 135 - 145 mmol/L   Potassium 2.9 (L) 3.5 - 5.1 mmol/L   Chloride 116 (H) 98 - 111 mmol/L   CO2 16 (L) 22 - 32 mmol/L   Glucose, Bld 175 (H) 70 - 99 mg/dL   BUN 18 6 - 20 mg/dL   Creatinine, Ser 1.61 0.61 - 1.24 mg/dL   Calcium 7.6 (L) 8.9 - 10.3 mg/dL   Total Protein 4.2 (L) 6.5 - 8.1 g/dL   Albumin 2.5 (L) 3.5 - 5.0 g/dL   AST 32 15 - 41 U/L   ALT 30 0 - 44 U/L   Alkaline Phosphatase 48 38 - 126 U/L   Total Bilirubin 0.6 0.3 - 1.2 mg/dL   GFR calc non Af Amer >60 >60 mL/min   GFR calc Af Amer >60 >60 mL/min   Anion gap 8 5 - 15    Comment: Performed at [facility], [address] [place], [place] [address]  CBC     Status: Abnormal   Collection Time: 07/10/18  6:13 PM  Result Value Ref Range   WBC 19.5 (H) 4.0 - 10.5 K/uL   RBC 2.84 (L) 4.22 - 5.81 MIL/uL   Hemoglobin 9.2 (L) 13.0 - 17.0 g/dL   HCT 54.0 (L) 98.1 - 19.1 %   MCV 109.5 (H) 80.0 - 100.0 fL   MCH 32.4 26.0 - 34.0 pg   MCHC 29.6 (L) 30.0 - 36.0 g/dL   RDW 47.8 29.5 - 62.1 %   Platelets 142 (L) 150 - 400 K/uL    Comment: REPEATED TO VERIFY   nRBC 0.0 0.0 - 0.2 %    Comment: Performed at [facility], [address], [place], [place] [address]  Lactic acid, plasma     Status: Abnormal   Collection Time: 07/10/18  6:13 PM  Result Value Ref Range   Lactic Acid, Venous 5.1 (HH) 0.5 - 1.9 mmol/L    Comment: CRITICAL RESULT CALLED TO, READ BACK BY AND VERIFIED WITH: Freddy Finner 1901 07/10/2018 WBOND Performed at [facility], [address] [place], [place] [address]   Protime-INR     Status: Abnormal   Collection Time: 07/10/18  6:13 PM  Result Value Ref Range   Prothrombin Time 16.6 (H) 11.4 - 15.2 seconds   INR 1.36     Comment: Performed at [facility], [address] [place], [place] [address]  Prepare platelet pheresis     Status: None (Preliminary result)   Collection Time: 07/10/18  6:30 PM  Result Value Ref Range   Unit Number [id number]    Blood Component Type PLTPHER LR2    Unit division 00    Status of Unit ISSUED    Transfusion Status OK TO TRANSFUSE    Unit Number [id number]    Blood Component Type PLTPHER LR2    Unit division 00    Status of Unit ISSUED    Transfusion Status OK TO TRANSFUSE    Unit Number [id number]    Blood Component Type PLTP LR1 PAS    Unit division 00    Status of Unit ISSUED    Transfusion Status      OK TO TRANSFUSE Performed at [facility], [address], [place], [place] [address]   Prepare cryoprecipitate     Status:  None (Preliminary result)   Collection Time: 07/10/18  6:30 PM  Result Value Ref Range     Unit Number [id number]    Blood Component Type CRYPOOL THAW    Unit division 00    Status of Unit ISSUED    Transfusion Status OK TO TRANSFUSE    Unit Number [id number]    Blood Component Type CRYPOOL THAW    Unit division 00    Status of Unit ISSUED    Transfusion Status OK TO TRANSFUSE    Unit Number [id number]    Blood Component Type CRYPOOL THAW    Unit division 00    Status of Unit ISSUED    Transfusion Status      OK TO TRANSFUSE Performed at [facility], [address] [place], [place] [address]   Initiate MTP (Blood Bank Notification)     Status: None   Collection Time: 07/10/18  6:47 PM  Result Value Ref Range   Initiate Massive Transfusion Protocol      MTP ORDER RECEIVED Performed at [facility], [address], [place], [place] [address]   Ethanol     Status: None   Collection Time: 07/10/18  7:00 PM  Result Value Ref Range   Alcohol, Ethyl (B) <10 <10 mg/dL    Comment: (NOTE) Lowest detectable limit for serum alcohol is 10 mg/dL. For medical purposes only. Performed at [facility], [address], [place], [place] [address]   DIC (disseminated intravasc coag) panel (STAT)     Status: Abnormal   Collection Time: 07/10/18  7:00 PM  Result Value Ref Range   Prothrombin Time 16.9 (H) 11.4 - 15.2 seconds   INR 1.39    aPTT 30 24 - 36 seconds   Fibrinogen 184 (L) 210 - 475 mg/dL   D-Dimer, Quant 55.97 (H) 0.00 - 0.50 ug/mL-FEU    Comment: (NOTE) At the manufacturer cut-off of 0.50 ug/mL FEU, this assay has been documented to exclude PE with a sensitivity and negative predictive value of 97 to 99%.  At this time, this assay has not been approved by the FDA to exclude DVT/VTE. Results should be correlated with clinical presentation.    Platelets 142 (L) 150 - 400 K/uL   Smear Review NO SCHISTOCYTES SEEN     Comment: Performed at [facility], [address], [place], [place] [address]  I-STAT 7, (LYTES, BLD GAS, ICA,  H+H)     Status: Abnormal   Collection Time: 07/10/18  8:45 PM  Result Value Ref Range   pH, Arterial 7.193 (LL) 7.350 - 7.450   pCO2 arterial 48.6 (H) 32.0 - 48.0 mmHg   pO2, Arterial 358.0 (H) 83.0 - 108.0 mmHg   Bicarbonate 18.8 (L) 20.0 - 28.0 mmol/L   TCO2 20 (L) 22 - 32 mmol/L   O2 Saturation 100.0 %   Acid-base deficit 9.0 (H) 0.0 - 2.0 mmol/L   Sodium 148 (H) 135 - 145 mmol/L   Potassium 4.3 3.5 - 5.1 mmol/L   Calcium, Ion 0.37 (LL) 1.15 - 1.40 mmol/L   HCT 19.0 (L) 39.0 - 52.0 %   Hemoglobin 6.5 (LL) 13.0 - 17.0 g/dL   Patient temperature 45.3 C    Sample type ARTERIAL   I-STAT 7, (LYTES, BLD GAS, ICA, H+H)     Status: Abnormal   Collection Time: 07/10/18  9:15 PM  Result Value Ref Range   pH, Arterial 7.044 (LL) 7.350 - 7.450  pCO2 arterial 73.4 (HH) 32.0 - 48.0 mmHg   pO2, Arterial 51.0 (L) 83.0 - 108.0 mmHg   Bicarbonate 20.3 20.0 - 28.0 mmol/L   TCO2 23 22 - 32 mmol/L   O2 Saturation 70.0 %   Acid-base deficit 11.0 (H) 0.0 - 2.0 mmol/L   Sodium 147 (H) 135 - 145 mmol/L   Potassium 4.4 3.5 - 5.1 mmol/L   Calcium, Ion 0.55 (LL) 1.15 - 1.40 mmol/L   HCT 28.0 (L) 39.0 - 52.0 %   Hemoglobin 9.5 (L) 13.0 - 17.0 g/dL   Patient temperature 24.4 C    Sample type ARTERIAL    Comment NOTIFIED PHYSICIAN   I-STAT 7, (LYTES, BLD GAS, ICA, H+H)     Status: Abnormal   Collection Time: 07/10/18  9:33 PM  Result Value Ref Range   pH, Arterial 7.156 (LL) 7.350 - 7.450   pCO2 arterial 70.0 (HH) 32.0 - 48.0 mmHg   pO2, Arterial 77.0 (L) 83.0 - 108.0 mmHg   Bicarbonate 25.2 20.0 - 28.0 mmol/L   TCO2 27 22 - 32 mmol/L   O2 Saturation 92.0 %   Acid-base deficit 5.0 (H) 0.0 - 2.0 mmol/L   Sodium 148 (H) 135 - 145 mmol/L   Potassium 4.1 3.5 - 5.1 mmol/L   Calcium, Ion 0.91 (L) 1.15 - 1.40 mmol/L   HCT 29.0 (L) 39.0 - 52.0 %   Hemoglobin 9.9 (L) 13.0 - 17.0 g/dL   Patient temperature 62.8 C    Sample type ARTERIAL   DIC panel     Status: Abnormal   Collection Time: 07/10/18   9:37 PM  Result Value Ref Range   Prothrombin Time 17.1 (H) 11.4 - 15.2 seconds   INR 1.41    aPTT 56 (H) 24 - 36 seconds    Comment:        IF BASELINE aPTT IS ELEVATED, SUGGEST PATIENT RISK ASSESSMENT BE USED TO DETERMINE APPROPRIATE ANTICOAGULANT THERAPY.    Fibrinogen 220 210 - 475 mg/dL   D-Dimer, Quant >63.81 (H) 0.00 - 0.50 ug/mL-FEU    Comment: (NOTE) At the manufacturer cut-off of 0.50 ug/mL FEU, this assay has been documented to exclude PE with a sensitivity and negative predictive value of 97 to 99%.  At this time, this assay has not been approved by the FDA to exclude DVT/VTE. Results should be correlated with clinical presentation.    Platelets 43 (L) 150 - 400 K/uL    Comment: REPEATED TO VERIFY PLATELET COUNT CONFIRMED BY SMEAR SPECIMEN CHECKED FOR CLOTS Immature Platelet Fraction may be clinically indicated, consider ordering this additional test [id number]    Smear Review NO SCHISTOCYTES SEEN     Comment: Performed at [facility], [address] [place], [place] [address]  Prepare cryoprecipitate     Status: None (Preliminary result)   Collection Time: 07/10/18 10:05 PM  Result Value Ref Range   Unit Number [id number]    Blood Component Type CRYPOOL THAW    Unit division 00    Status of Unit ISSUED    Transfusion Status      OK TO TRANSFUSE Performed at [facility], [address], [place], [place] [address]   I-STAT 7,

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
## 2018-07-11 NOTE — Progress Notes (Signed)
1016: Critical lactic received from lab. Dr. Janee Morn made aware. No new orders at this time. Dicie Beam RN BSN

## 2018-07-11 NOTE — Progress Notes (Signed)
1030:Spoke with Dr. Janee Morn regarding need for urinary catheter and suprapubic catheter. Dr. Janee Morn following up with urology to see if foley can be discontinued. Will continue to monitor.   1046: Wound dressing changed and reinforced. Wound sites continue to weep, will continue to monitor and change as necessary.  Will continue to monitor.  Dicie Beam RN BSN

## 2018-07-11 NOTE — Progress Notes (Signed)
Patient ID: Christopher [name], male   DOB: January 07, 1966, 53 y.o.   MRN: [id number] Follow up - Trauma Critical Care  Patient Details:    Christopher [name] is an 53 y.o. male.  Lines/tubes : Airway 7.5 mm (Active)  Secured at (cm) 23 cm 07/11/2018  7:44 AM  Measured From Lips 07/11/2018  7:44 AM  Secured Location Center 07/11/2018  7:44 AM  Secured By [employer] 07/11/2018  7:44 AM  Tube Holder Repositioned Yes 07/11/2018  7:44 AM  Cuff Pressure (cm H2O) 28 cm H2O 07/11/2018  7:44 AM  Site Condition Dry 07/11/2018  7:44 AM     CVC Triple Lumen 07/10/18 Right Femoral (Active)  Indication for Insertion or Continuance of Line Vasoactive infusions;Limited venous access - need for IV therapy >5 days (PICC only) 07/11/2018  7:52 AM  Site Assessment Dry;Intact 07/11/2018 12:00 AM  Proximal Lumen Status Infusing 07/11/2018  7:52 AM  Medial Lumen Status Flushed;Saline locked 07/11/2018  7:52 AM  Distal Lumen Status Flushed;Saline locked 07/11/2018  7:52 AM  Dressing Type Transparent;Occlusive 07/11/2018  7:52 AM  Dressing Status Clean;Dry;Intact 07/11/2018  7:52 AM  Dressing Intervention Dressing reinforced 07/11/2018 12:00 AM  Dressing Change Due 07/17/18 07/11/2018 12:00 AM     Negative Pressure Wound Therapy Abdomen Mid (Active)  Site / Wound Assessment Dressing in place / Unable to assess 07/11/2018  8:00 AM  Peri-wound Assessment Intact 07/11/2018  8:00 AM  Drainage Description Serosanguineous 07/11/2018  8:00 AM  Output (mL) 175 mL 07/11/2018  8:00 AM     NG/OG Tube Orogastric 16 Fr. Center mouth Xray;Confirmed by Surgical Manipulation;Aucultation (Active)  Site Assessment Clean;Dry;Intact 07/11/2018  8:00 AM  Ongoing Placement Verification No change in respiratory status;No acute changes, not attributed to clinical condition;No change in cm markings or external length of tube from initial placement 07/11/2018  8:00 AM  Status Clamped 07/11/2018  8:00 AM  Drainage Appearance Bile  07/10/2018  6:05 PM     Urethral Catheter Dr Marlou Porch Urologist Coude;Temperature probe 16 Fr. (Active)  Indication for Insertion or Continuance of Catheter Bladder outlet obstruction / other urologic reason;Unstable critical patients (first 24-48 hours) 07/11/2018  7:52 AM  Site Assessment Clean;Intact 07/11/2018  7:52 AM  Catheter Maintenance Bag below level of bladder;Catheter secured;Drainage bag/tubing not touching floor;Insertion date on drainage bag;No dependent loops;Seal intact;Bag emptied prior to transport 07/11/2018  7:52 AM  Collection Container Standard drainage bag 07/11/2018  7:52 AM  Securement Method Tape 07/10/2018  7:35 PM  Urinary Catheter Interventions Unclamped 07/10/2018  7:35 PM  Output (mL) 60 mL 07/11/2018  8:00 AM     Suprapubic Catheter Non-latex 14 Fr. (Active)  Site Assessment Clean;Intact 07/11/2018  8:00 AM  Collection Container Leg bag 07/11/2018  8:00 AM  Securement Method Sutured 07/11/2018  8:00 AM  Indication for Insertion or Continuance of Catheter Unstable critical patients (first 24-48 hours) 07/11/2018  4:00 AM  Output (mL) 100 mL 07/11/2018  8:00 AM    Microbiology/Sepsis markers: Results for orders placed or performed during the hospital encounter of 07/10/18  MRSA PCR Screening     Status: None   Collection Time: 07/11/18  1:47 AM  Result Value Ref Range Status   MRSA by PCR NEGATIVE NEGATIVE Final    Comment:        The GeneXpert MRSA Assay (FDA approved for NASAL specimens only), is one component of a comprehensive MRSA colonization surveillance program. It is not intended to diagnose MRSA infection nor to guide or  monitor treatment for MRSA infections. Performed at [facility], [address], [place], [place] [address]     Anti-infectives:  Anti-infectives (From admission, onward)   Start     Dose/Rate Route Frequency Ordered Stop   07/11/18 0400  ceFAZolin (ANCEF) IVPB 2g/100 mL premix     2 g 200 mL/hr over 30 Minutes Intravenous  Every 8 hours 07/10/18 2109     07/10/18 1815  ceFAZolin (ANCEF) IVPB 2g/100 mL premix  Status:  Discontinued     2 g 200 mL/hr over 30 Minutes Intravenous  Once 07/10/18 1814 07/10/18 2339      Best Practice/Protocols:  VTE Prophylaxis: Mechanical Continous Sedation  Consults: Treatment Team:  Md, Trauma, MD Durene Romans, MD Haddix, Gillie Manners, MD    Studies:    Events:  Subjective:    Overnight Issues:   Objective:  Vital signs for last 24 hours: Temp:  [93 F (33.9 C)-100.8 F (38.2 C)] 100.2 F (37.9 C) (01/29 0930) Pulse Rate:  [92-145] 117 (01/29 0930) Resp:  [10-24] 18 (01/29 0930) BP: (81-141)/(43-128) 102/65 (01/29 0930) SpO2:  [82 %-100 %] 99 % (01/29 0930) Arterial Line BP: (99-130)/(45-70) 124/62 (01/29 0930) FiO2 (%):  [40 %-100 %] 40 % (01/29 0800) Weight:  [72.6 kg] 72.6 kg (01/28 1742)  Hemodynamic parameters for last 24 hours:    Intake/Output from previous day: 01/28 0701 - 01/29 0700 In: 29083.1 [I.V.:22242.1; GNFAO:1308; IV Piggyback:100] Out: 8360 [Urine:2110; Drains:450; Blood:5800]  Intake/Output this shift: Total I/O In: 632.1 [I.V.:330.1; Blood:302] Out: 335 [Urine:160; Drains:175]  Vent settings for last 24 hours: Vent Mode: PRVC FiO2 (%):  [40 %-100 %] 40 % Set Rate:  [18 bmp] 18 bmp Vt Set:  [500 mL-650 mL] 650 mL PEEP:  [5 cmH20] 5 cmH20 Plateau Pressure:  [14 cmH20-30 cmH20] 23 cmH20  Physical Exam:  General: on vent Neuro: sedated HEENT/Neck: ETT and collar Resp: clear to auscultation bilaterally CVS: RRR GI: open abdomen VAC Extremities: ortho palceing traction pin LLE  Results for orders placed or performed during the hospital encounter of 07/10/18 (from the past 24 hour(s))  Prepare fresh frozen plasma     Status: None (Preliminary result)   Collection Time: 07/10/18  5:20 PM  Result Value Ref Range   Unit Number [id number]    Blood Component Type LIQ PLASMA    Unit division 00    Status of Unit  QUARANTINED    Unit tag comment EMERGENCY RELEASE    Transfusion Status OK TO TRANSFUSE    Unit Number [id number]    Blood Component Type LIQ PLASMA    Unit division 00    Status of Unit ISSUED    Unit tag comment EMERGENCY RELEASE    Transfusion Status OK TO TRANSFUSE    Unit Number [id number]    Blood Component Type LIQ PLASMA    Unit division 00    Status of Unit ISSUED    Unit tag comment EMERGENCY RELEASE    Transfusion Status OK TO TRANSFUSE    Unit Number [id number]    Blood Component Type THAWED PLASMA    Unit division 00    Status of Unit ISSUED    Transfusion Status OK TO TRANSFUSE    Unit Number [id number]    Blood Component Type THAWED PLASMA    Unit division 00    Status of Unit ISSUED    Transfusion Status OK TO TRANSFUSE    Unit Number [id number]    Blood Component Type  THAWED PLASMA    Unit division 00    Status of Unit ISSUED    Transfusion Status OK TO TRANSFUSE    Unit Number [id number]    Blood Component Type THAWED PLASMA    Unit division 00    Status of Unit ISSUED    Transfusion Status OK TO TRANSFUSE    Unit Number [id number]    Blood Component Type THW PLS APHR    Unit division B0    Status of Unit ISSUED    Transfusion Status OK TO TRANSFUSE    Unit Number [id number]    Blood Component Type THW PLS APHR    Unit division B0    Status of Unit ISSUED    Transfusion Status OK TO TRANSFUSE    Unit Number [id number]    Blood Component Type THAWED PLASMA    Unit division 00    Status of Unit ISSUED    Transfusion Status OK TO TRANSFUSE    Unit Number [id number]    Blood Component Type THAWED PLASMA    Unit division 00    Status of Unit ISSUED    Transfusion Status OK TO TRANSFUSE    Unit Number [id number]    Blood Component Type THAWED PLASMA    Unit division 00    Status of Unit ISSUED    Transfusion Status OK TO TRANSFUSE    Unit Number [id number]    Blood Component Type THAWED PLASMA    Unit  division 00    Status of Unit ISSUED    Transfusion Status OK TO TRANSFUSE    Unit Number [id number]    Blood Component Type THAWED PLASMA    Unit division 00    Status of Unit ISSUED    Transfusion Status OK TO TRANSFUSE    Unit Number [id number]    Blood Component Type THW PLS APHR    Unit division B0    Status of Unit ISSUED    Transfusion Status OK TO TRANSFUSE    Unit Number [id number]    Blood Component Type THW PLS APHR    Unit division B0    Status of Unit ISSUED    Transfusion Status OK TO TRANSFUSE    Unit Number [id number]    Blood Component Type THAWED PLASMA    Unit division 00    Status of Unit ISSUED    Transfusion Status OK TO TRANSFUSE    Unit Number [id number]    Blood Component Type THAWED PLASMA    Unit division 00    Status of Unit ISSUED    Transfusion Status OK TO TRANSFUSE    Unit Number [id number]    Blood Component Type THAWED PLASMA    Unit division 00    Status of Unit ISSUED    Transfusion Status OK TO TRANSFUSE    Unit Number [id number]    Blood Component Type THAWED PLASMA    Unit division 00    Status of Unit ISSUED    Transfusion Status OK TO TRANSFUSE    Unit Number [id number]    Blood Component Type THW PLS APHR    Unit division 00    Status of Unit ISSUED    Transfusion Status OK TO TRANSFUSE    Unit Number [id number]    Blood Component Type THAWED PLASMA    Unit division 00    Status of Unit ISSUED    Transfusion Status OK TO TRANSFUSE    Unit Number [id number]    Blood Component  Type THAWED PLASMA    Unit division 00    Status of Unit ISSUED    Transfusion Status OK TO TRANSFUSE    Unit Number [id number]    Blood Component Type THW PLS APHR    Unit division A0    [facility]    [facility] Status OK TO [facility]    Unit Number [id number]    Blood Component Type THAWED PLASMA    Unit division 00    [facility]    Transfusion Status OK TO TRANSFUSE     Unit Number [id number]    Blood Component Type THAWED PLASMA    Unit division 00    [facility]    Transfusion Status OK TO TRANSFUSE    Unit Number [id number]    Blood Component Type THW PLS APHR    Unit division B0    [facility]    Transfusion Status OK TO TRANSFUSE    Unit Number [id number]    Blood Component Type THAWED PLASMA    Unit division 00    [facility]    Transfusion Status OK TO TRANSFUSE    Unit Number [id number]    Blood Component Type THAWED PLASMA    Unit division 00    [facility]    Transfusion Status OK TO TRANSFUSE    Unit Number [id number]    Blood Component Type THAWED PLASMA    Unit division 00    [facility]    Transfusion Status OK TO TRANSFUSE    Unit Number [id number]    Blood Component Type THAWED PLASMA    Unit division 00    [facility]    Transfusion Status OK TO TRANSFUSE    Unit Number [id number]    Blood Component Type THAWED PLASMA    Unit division 00    Status of Unit ALLOCATED    Transfusion Status OK TO TRANSFUSE    Unit Number [id number]    Blood Component Type THAWED PLASMA    Unit division 00    Status of Unit ALLOCATED    Transfusion Status OK TO TRANSFUSE    Unit Number [id number]    Blood Component Type THAWED PLASMA    Unit division 00    Status of Unit ALLOCATED    Transfusion Status OK TO TRANSFUSE    Unit Number [id number]    Blood Component Type THAWED PLASMA    Unit division 00    Status of Unit ALLOCATED    Transfusion Status OK TO TRANSFUSE   Type and screen Ordered by PROVIDER DEFAULT     Status: None (Preliminary result)   Collection Time: 07/10/18  5:54 PM  Result Value Ref Range   ABO/RH(D) O POS    Antibody Screen NEG    Sample Expiration      07/13/2018 Performed at [facility], [address] [place], [place] [address]    Unit Number [id number]     Blood Component Type RED CELLS,LR    Unit division 00    Status of Unit ISSUED    Unit tag comment EMERGENCY RELEASE    Transfusion Status OK TO TRANSFUSE    Crossmatch Result COMPATIBLE    Unit Number [id number]    Blood Component Type RBC LR PHER1    Unit division 00  Status of Unit ISSUED    Unit tag comment EMERGENCY RELEASE    Transfusion Status OK TO TRANSFUSE    Crossmatch Result COMPATIBLE    Unit Number [id number]    Blood Component Type RED CELLS,LR    Unit division 00    Status of Unit ISSUED    Unit tag comment EMERGENCY RELEASE    Transfusion Status OK TO TRANSFUSE    Crossmatch Result COMPATIBLE    Unit Number [id number]    Blood Component Type RED CELLS,LR    Unit division 00    Status of Unit ISSUED    Unit tag comment EMERGENCY RELEASE    Transfusion Status OK TO TRANSFUSE    Crossmatch Result COMPATIBLE    Unit Number [id number]    Blood Component Type RED CELLS,LR    Unit division 00    Status of Unit ISSUED    Unit tag comment VERBAL ORDERS PER DR PFFEIFFER    Transfusion Status OK TO TRANSFUSE    Crossmatch Result COMPATIBLE    Unit Number [id number]    Blood Component Type RED CELLS,LR    Unit division 00    Status of Unit ISSUED    Unit tag comment VERBAL ORDERS PER DR PFEIFFER    Transfusion Status OK TO TRANSFUSE    Crossmatch Result COMPATIBLE    Unit Number [id number]    Blood Component Type RED CELLS,LR    Unit division 00    Status of Unit ISSUED    Unit tag comment VERBAL ORDERS PER DR PFEIFFER    Transfusion Status OK TO TRANSFUSE    Crossmatch Result COMPATIBLE    Unit Number [id number]    Blood Component Type RED CELLS,LR    Unit division 00    Status of Unit ISSUED    Unit tag comment VERBAL ORDERS PER DR PFEIFFER    Transfusion Status OK TO TRANSFUSE    Crossmatch Result COMPATIBLE    Unit Number [id number]    Blood Component Type RED CELLS,LR    Unit division 00    Status of Unit ISSUED     Transfusion Status OK TO TRANSFUSE    Crossmatch Result Compatible    Unit Number [id number]    Blood Component Type RED CELLS,LR    Unit division 00    Status of Unit ISSUED    Transfusion Status OK TO TRANSFUSE    Crossmatch Result Compatible    Unit Number [id number]    Blood Component Type RED CELLS,LR    Unit division 00    Status of Unit ISSUED    Transfusion Status OK TO TRANSFUSE    Crossmatch Result Compatible    Unit Number [id number]    Blood Component Type RED CELLS,LR    Unit division 00    Status of Unit ISSUED    Transfusion Status OK TO TRANSFUSE    Crossmatch Result Compatible    Unit Number [id number]    Blood Component Type RED CELLS,LR    Unit division 00    Status of Unit ISSUED    Transfusion Status OK TO TRANSFUSE    Crossmatch Result Compatible    Unit Number [id number]    Blood Component Type RED CELLS,LR    Unit division 00    Status of Unit ISSUED    Transfusion Status OK TO TRANSFUSE    Crossmatch Result Compatible    Unit Number [id number]    Blood Component Type RED CELLS,LR    Unit division 00  Status of Unit ISSUED    Transfusion Status OK TO TRANSFUSE    Crossmatch Result Compatible    Unit Number [id number]    Blood Component Type RED CELLS,LR    Unit division 00    Status of Unit ISSUED    Transfusion Status OK TO TRANSFUSE    Crossmatch Result Compatible    Unit Number [id number]    Blood Component Type RED CELLS,LR    Unit division 00    Status of Unit ISSUED    Transfusion Status OK TO TRANSFUSE    Crossmatch Result Compatible    Unit Number [id number]    Blood Component Type RED CELLS,LR    Unit division 00    Status of Unit ISSUED    Transfusion Status OK TO TRANSFUSE    Crossmatch Result Compatible    Unit Number [id number]    Blood Component Type RED CELLS,LR    Unit division 00    Status of Unit ISSUED    Transfusion Status OK TO TRANSFUSE    Crossmatch Result Compatible    Unit  Number [id number]    Blood Component Type RED CELLS,LR    Unit division 00    Status of Unit ISSUED    Transfusion Status OK TO TRANSFUSE    Crossmatch Result Compatible    Unit Number [id number]    Blood Component Type RED CELLS,LR    Unit division 00    Status of Unit ISSUED    Transfusion Status OK TO TRANSFUSE    Crossmatch Result Compatible    Unit Number [id number]    Blood Component Type RBC LR PHER2    Unit division 00    Status of Unit ISSUED    Transfusion Status OK TO TRANSFUSE    Crossmatch Result Compatible    Unit Number [id number]    Blood Component Type RED CELLS,LR    Unit division 00    Status of Unit ISSUED    Transfusion Status OK TO TRANSFUSE    Crossmatch Result Compatible    Unit Number [id number]    Blood Component Type RBC LR PHER1    Unit division 00    Status of Unit ISSUED    Transfusion Status OK TO TRANSFUSE    Crossmatch Result Compatible    Unit Number [id number]    Blood Component Type RED CELLS,LR    Unit division 00    Status of Unit ISSUED    Transfusion Status OK TO TRANSFUSE    Crossmatch Result Compatible    Unit Number [id number]    Blood Component Type RED CELLS,LR    Unit division 00    Status of Unit ISSUED    Transfusion Status OK TO TRANSFUSE    Crossmatch Result Compatible    Unit Number [id number]    Blood Component Type RED CELLS,LR    Unit division 00    Status of Unit ISSUED    Transfusion Status OK TO TRANSFUSE    Crossmatch Result Compatible    Unit Number [id number]    Blood Component Type RBC LR PHER2    Unit division 00    Status of Unit ISSUED    Transfusion Status OK TO TRANSFUSE    Crossmatch Result Compatible    Unit Number [id number]    Blood Component Type RED CELLS,LR    Unit division 00    Status of Unit ISSUED    Transfusion Status OK TO TRANSFUSE    Crossmatch Result Compatible    Unit  Number [id number]    Blood Component Type RED CELLS,LR    Unit division 00     Status of Unit ISSUED    Transfusion Status OK TO TRANSFUSE    Crossmatch Result Compatible    Unit Number [id number]    Blood Component Type RED CELLS,LR    Unit division 00    Status of Unit REL FROM [facility]    Transfusion Status OK TO TRANSFUSE    Crossmatch Result Compatible    Unit Number [id number]    Blood Component Type RED CELLS,LR    Unit division 00    Status of Unit REL FROM [facility]    Transfusion Status OK TO TRANSFUSE    Crossmatch Result Compatible    Unit Number [id number]    Blood Component Type RBC LR PHER1    Unit division 00    Status of Unit REL FROM [facility]    Transfusion Status OK TO TRANSFUSE    Crossmatch Result Compatible    Unit Number [id number]    Blood Component Type RBC LR PHER2    Unit division 00    Status of Unit REL FROM [facility]    Transfusion Status OK TO TRANSFUSE    Crossmatch Result Compatible    Unit Number [id number]    Blood Component Type RED CELLS,LR    Unit division 00    Status of Unit ALLOCATED    Transfusion Status OK TO TRANSFUSE    Crossmatch Result Compatible    Unit Number [id number]    Blood Component Type RBC LR PHER1    Unit division 00    Status of Unit ISSUED    Transfusion Status OK TO TRANSFUSE    Crossmatch Result Compatible    Unit Number [id number]    Blood Component Type RBC LR PHER2    Unit division 00    Status of Unit ALLOCATED    Transfusion Status OK TO TRANSFUSE    Crossmatch Result Compatible    Unit Number [id number]    Blood Component Type RBC LR PHER2    Unit division 00    Status of Unit ISSUED    Transfusion Status OK TO TRANSFUSE    Crossmatch Result Compatible   ABO/Rh     Status: None   Collection Time: 07/10/18  5:54 PM  Result Value Ref Range   ABO/RH(D)      O POS Performed at [facility], [address] [place], [place] [address]   CDS serology     Status: None   Collection Time: 07/10/18  6:13 PM  Result Value Ref Range   CDS serology specimen       SPECIMEN WILL BE HELD FOR 14 DAYS IF TESTING IS REQUIRED  Comprehensive metabolic panel     Status: Abnormal   Collection Time: 07/10/18  6:13 PM  Result Value Ref Range   Sodium 140 135 - 145 mmol/L   Potassium 2.9 (L) 3.5 - 5.1 mmol/L   Chloride 116 (H) 98 - 111 mmol/L   CO2 16 (L) 22 - 32 mmol/L   Glucose, Bld 175 (H) 70 - 99 mg/dL   BUN 18 6 - 20 mg/dL   Creatinine, Ser 1.51 0.61 - 1.24 mg/dL   Calcium 7.6 (L) 8.9 - 10.3 mg/dL   Total Protein 4.2 (L) 6.5 - 8.1 g/dL   Albumin 2.5 (L) 3.5 - 5.0 g/dL   AST 32 15 - 41 U/L   ALT 30 0 - 44 U/L  Alkaline Phosphatase 48 38 - 126 U/L   Total Bilirubin 0.6 0.3 - 1.2 mg/dL   GFR calc non Af Amer >60 >60 mL/min   GFR calc Af Amer >60 >60 mL/min   Anion gap 8 5 - 15  CBC     Status: Abnormal   Collection Time: 07/10/18  6:13 PM  Result Value Ref Range   WBC 19.5 (H) 4.0 - 10.5 K/uL   RBC 2.84 (L) 4.22 - 5.81 MIL/uL   Hemoglobin 9.2 (L) 13.0 - 17.0 g/dL   HCT 16.1 (L) 09.6 - 04.5 %   MCV 109.5 (H) 80.0 - 100.0 fL   MCH 32.4 26.0 - 34.0 pg   MCHC 29.6 (L) 30.0 - 36.0 g/dL   RDW 40.9 81.1 - 91.4 %   Platelets 142 (L) 150 - 400 K/uL   nRBC 0.0 0.0 - 0.2 %  Lactic acid, plasma     Status: Abnormal   Collection Time: 07/10/18  6:13 PM  Result Value Ref Range   Lactic Acid, Venous 5.1 (HH) 0.5 - 1.9 mmol/L  Protime-INR     Status: Abnormal   Collection Time: 07/10/18  6:13 PM  Result Value Ref Range   Prothrombin Time 16.6 (H) 11.4 - 15.2 seconds   INR 1.36   Prepare platelet pheresis     Status: None (Preliminary result)   Collection Time: 07/10/18  6:30 PM  Result Value Ref Range   Unit Number [id number]    Blood Component Type PLTPHER LR2    Unit division 00    Status of Unit ISSUED    Transfusion Status OK TO TRANSFUSE    Unit Number [id number]    Blood Component Type PLTPHER LR2    Unit division 00    Status of Unit ISSUED    Transfusion Status OK TO TRANSFUSE    Unit Number [id number]    Blood Component  Type PLTP LR1 PAS    Unit division 00    Status of Unit ISSUED    Transfusion Status      OK TO TRANSFUSE Performed at [facility], [address] [place], [place] [address]   Prepare cryoprecipitate     Status: None (Preliminary result)   Collection Time: 07/10/18  6:30 PM  Result Value Ref Range   Unit Number [id number]    Blood Component Type CRYPOOL THAW    Unit division 00    Status of Unit ISSUED    Transfusion Status OK TO TRANSFUSE    Unit Number [id number]    Blood Component Type CRYPOOL THAW    Unit division 00    Status of Unit ISSUED    Transfusion Status OK TO TRANSFUSE    Unit Number [id number]    Blood Component Type CRYPOOL THAW    Unit division 00    Status of Unit ISSUED    Transfusion Status      OK TO TRANSFUSE Performed at [facility], [address] [place], [place] [address]   Initiate MTP (Blood Bank Notification)     Status: None   Collection Time: 07/10/18  6:47 PM  Result Value Ref Range   Initiate Massive Transfusion Protocol      MTP ORDER RECEIVED Performed at [facility], [address], [place], [place] [address]   Ethanol     Status: None   Collection Time: 07/10/18  7:00 PM  Result Value Ref Range   Alcohol, Ethyl (B) <10 <10 mg/dL  DIC (disseminated intravasc coag) panel (STAT)     Status: Abnormal   Collection Time: 07/10/18  7:00 PM  Result Value Ref Range   Prothrombin Time 16.9 (H) 11.4 - 15.2 seconds   INR 1.39    aPTT 30 24 - 36 seconds   Fibrinogen 184 (L) 210 - 475 mg/dL   D-Dimer, Quant 40.98 (H) 0.00 - 0.50 ug/mL-FEU   Platelets 142 (L) 150 - 400 K/uL   Smear Review NO SCHISTOCYTES SEEN   I-STAT 7, (LYTES, BLD GAS, ICA, H+H)     Status: Abnormal   Collection Time: 07/10/18  8:21 PM  Result Value Ref Range   pH, Arterial 7.160 (LL) 7.350 - 7.450   pCO2 arterial 56.2 (H) 32.0 - 48.0 mmHg   pO2, Arterial 415.0 (H) 83.0 - 108.0 mmHg   Bicarbonate 20.2 20.0 - 28.0 mmol/L   TCO2 22 22 - 32  mmol/L   O2 Saturation 100.0 %   Acid-base deficit 8.0 (H) 0.0 - 2.0 mmol/L   Sodium 147 (H) 135 - 145 mmol/L   Potassium 4.7 3.5 - 5.1 mmol/L   Calcium, Ion 0.70 (LL) 1.15 - 1.40 mmol/L   HCT 16.0 (L) 39.0 - 52.0 %   Hemoglobin 5.4 (LL) 13.0 - 17.0 g/dL   Patient temperature 11.9 C    Sample type ARTERIAL    Comment NOTIFIED PHYSICIAN   I-STAT 7, (LYTES, BLD GAS, ICA, H+H)     Status: Abnormal   Collection Time: 07/10/18  8:45 PM  Result Value Ref Range   pH, Arterial 7.193 (LL) 7.350 - 7.450   pCO2 arterial 48.6 (H) 32.0 - 48.0 mmHg   pO2, Arterial 358.0 (H) 83.0 - 108.0 mmHg   Bicarbonate 18.8 (L) 20.0 - 28.0 mmol/L   TCO2 20 (L) 22 - 32 mmol/L   O2 Saturation 100.0 %   Acid-base deficit 9.0 (H) 0.0 - 2.0 mmol/L   Sodium 148 (H) 135 - 145 mmol/L   Potassium 4.3 3.5 - 5.1 mmol/L   Calcium, Ion 0.37 (LL) 1.15 - 1.40 mmol/L   HCT 19.0 (L) 39.0 - 52.0 %   Hemoglobin 6.5 (LL) 13.0 - 17.0 g/dL   Patient temperature 14.7 C    Sample type ARTERIAL   I-STAT 7, (LYTES, BLD GAS, ICA, H+H)     Status: Abnormal   Collection Time: 07/10/18  9:15 PM  Result Value Ref Range   pH, Arterial 7.044 (LL)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

## 2018-07-11 NOTE — Procedures (Signed)
Clinician: Mearl Latin, PA-C, Anne Shutter, PA-C   Procedure: complex, unstable pelvic ring fracture, L femoral neck fracture   Medications: Fentanyl and Versed infusion  Details:  Injury and treatment were reviewed with the patients family as pt is intubated and sedated. Pt with significant pelvic ring injury with profound left sided instability.  Patient also with severe soft tissue injury to his groin and left thigh. Skeletal traction will allow for better approximation of L hemipelvis relative to the sacrum while we await for clinical stability to proceed to the OR, as well as improve alignment of his left femoral neck.  Clinical exam was completed.  Due to severe soft tissue degloving injury to his left thigh felt that a proximal tibia traction pin was more appropriate to place it outside the zone of injury.  X-rays were obtained preprocedure of the left knee to evaluate for any additional injury as clinical exam was limited.  Pt is intubated and sedated. Bolus of versed given prior to start of procedure.   A 2.0 mm K wire was selected. The pin was inserted at the lateral aspect of the proximal lower leg approximately 1cm distal to tibial tubercle and 2 cm posterior. The pin was inserted and placed down to the bone. Once I was comfortable with the starting point the pin was advanced utilizing power drill through the lateral proximal tibia and out the medial proximal tibia. Trying to remain parallel to the joint.  After the pin was advanced through and equal lengths of the pin were noted medially and laterally the tension bow was applied and the ends of the wire were bent up and covered with tape. 30 pounds of weight were added.          patient seemingly tolerated the procedure well. No complications were noted. Symmetric pulses were noted post procedure. Unable to assess motor or sensory functions given current clinical condition.   Post traction pelvic x-rays will be obtained  CT scan of  pelvis pending   Pt remains in critical condition.  Ortho will follow along   Mearl Latin, PA-C [phone] (C) 07/11/2018, 9:21 AM  [facility] [address] [place] [address] [phone] Collier Bullock (F)

## 2018-07-11 NOTE — Progress Notes (Signed)
RT NOTE: Patient transported on ventilator from room 4N18 to CT and back to room 4N18 with no apparent complications. Vitals are stable. RT will continue to monitor.
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
## 2018-07-12 ENCOUNTER — Inpatient Hospital Stay (HOSPITAL_COMMUNITY): Payer: No Typology Code available for payment source

## 2018-07-12 ENCOUNTER — Encounter (HOSPITAL_COMMUNITY): Payer: Self-pay | Admitting: Certified Registered Nurse Anesthetist

## 2018-07-12 ENCOUNTER — Inpatient Hospital Stay (HOSPITAL_COMMUNITY): Payer: No Typology Code available for payment source | Admitting: Certified Registered Nurse Anesthetist

## 2018-07-12 ENCOUNTER — Encounter (HOSPITAL_COMMUNITY)
Admission: EM | Disposition: A | Discharge status: Nursing Home (Includes ICF) | Payer: Self-pay | Source: Home / Self Care

## 2018-07-12 HISTORY — PX: LAPAROTOMY: SHX154

## 2018-07-12 HISTORY — PX: VACUUM ASSISTED CLOSURE CHANGE: SHX5227

## 2018-07-12 HISTORY — PX: HIP PINNING,CANNULATED: SHX1758

## 2018-07-12 HISTORY — PX: APPLICATION OF WOUND VAC: SHX5189

## 2018-07-12 LAB — CBC
HCT: 25 % — ABNORMAL LOW (ref 39.0–52.0)
HCT: 23.6 % — ABNORMAL LOW (ref 39.0–52.0)
HCT: 23.3 % — ABNORMAL LOW (ref 39.0–52.0)
HCT: 29.5 % — ABNORMAL LOW (ref 39.0–52.0)
Hemoglobin: 8.3 g/dL — ABNORMAL LOW (ref 13.0–17.0)
Hemoglobin: 8 g/dL — ABNORMAL LOW (ref 13.0–17.0)
Hemoglobin: 7.9 g/dL — ABNORMAL LOW (ref 13.0–17.0)
Hemoglobin: 9.8 g/dL — ABNORMAL LOW (ref 13.0–17.0)
MCH: 29.2 pg (ref 26.0–34.0)
MCH: 30 pg (ref 26.0–34.0)
MCH: 30 pg (ref 26.0–34.0)
MCH: 29.3 pg (ref 26.0–34.0)
MCHC: 33.2 g/dL (ref 30.0–36.0)
MCHC: 33.9 g/dL (ref 30.0–36.0)
MCHC: 33.9 g/dL (ref 30.0–36.0)
MCHC: 33.2 g/dL (ref 30.0–36.0)
MCV: 88 fL (ref 80.0–100.0)
MCV: 88.4 fL (ref 80.0–100.0)
MCV: 88.6 fL (ref 80.0–100.0)
MCV: 88.3 fL (ref 80.0–100.0)
Platelets: 73 10*3/uL — ABNORMAL LOW (ref 150–400)
Platelets: 76 10*3/uL — ABNORMAL LOW (ref 150–400)
Platelets: 78 10*3/uL — ABNORMAL LOW (ref 150–400)
Platelets: 62 10*3/uL — ABNORMAL LOW (ref 150–400)
RBC: 2.84 MIL/uL — ABNORMAL LOW (ref 4.22–5.81)
RBC: 2.67 MIL/uL — ABNORMAL LOW (ref 4.22–5.81)
RBC: 2.63 MIL/uL — ABNORMAL LOW (ref 4.22–5.81)
RBC: 3.34 MIL/uL — ABNORMAL LOW (ref 4.22–5.81)
RDW: 15.1 % (ref 11.5–15.5)
RDW: 15.3 % (ref 11.5–15.5)
RDW: 15.3 % (ref 11.5–15.5)
RDW: 15.8 % — ABNORMAL HIGH (ref 11.5–15.5)
WBC: 9.6 10*3/uL (ref 4.0–10.5)
WBC: 8.6 10*3/uL (ref 4.0–10.5)
WBC: 9 10*3/uL (ref 4.0–10.5)
WBC: 5.5 10*3/uL (ref 4.0–10.5)
nRBC: 0 % (ref 0.0–0.2)
nRBC: 0 % (ref 0.0–0.2)
nRBC: 0 % (ref 0.0–0.2)
nRBC: 0 % (ref 0.0–0.2)

## 2018-07-12 LAB — BPAM CRYOPRECIPITATE
Blood Product Expiration Date: 202001290035
Blood Product Expiration Date: 202001290130
Blood Product Expiration Date: 202001290241
Blood Product Expiration Date: 202001290405
ISSUE DATE / TIME: 202001281853
ISSUE DATE / TIME: 202001281955
ISSUE DATE / TIME: 202001290117
ISSUE DATE / TIME: 202001290022
Unit Type and Rh: 5100
Unit Type and Rh: 5100
Unit Type and Rh: 5100
Unit Type and Rh: 5100

## 2018-07-12 LAB — SURGICAL PCR SCREEN
MRSA, PCR: NEGATIVE
Staphylococcus aureus: NEGATIVE

## 2018-07-12 LAB — POCT I-STAT 7, (LYTES, BLD GAS, ICA,H+H)
Acid-base deficit: 2 mmol/L (ref 0.0–2.0)
Acid-base deficit: 1 mmol/L (ref 0.0–2.0)
Acid-base deficit: 2 mmol/L (ref 0.0–2.0)
Bicarbonate: 25.7 mmol/L (ref 20.0–28.0)
Bicarbonate: 24.7 mmol/L (ref 20.0–28.0)
Bicarbonate: 24.8 mmol/L (ref 20.0–28.0)
Bicarbonate: 23.7 mmol/L (ref 20.0–28.0)
Calcium, Ion: 1.07 mmol/L — ABNORMAL LOW (ref 1.15–1.40)
Calcium, Ion: 1.05 mmol/L — ABNORMAL LOW (ref 1.15–1.40)
Calcium, Ion: 1.04 mmol/L — ABNORMAL LOW (ref 1.15–1.40)
Calcium, Ion: 1.01 mmol/L — ABNORMAL LOW (ref 1.15–1.40)
HCT: 21 % — ABNORMAL LOW (ref 39.0–52.0)
HCT: 17 % — ABNORMAL LOW (ref 39.0–52.0)
HCT: 23 % — ABNORMAL LOW (ref 39.0–52.0)
HCT: 25 % — ABNORMAL LOW (ref 39.0–52.0)
Hemoglobin: 7.1 g/dL — ABNORMAL LOW (ref 13.0–17.0)
Hemoglobin: 5.8 g/dL — CL (ref 13.0–17.0)
Hemoglobin: 7.8 g/dL — ABNORMAL LOW (ref 13.0–17.0)
Hemoglobin: 8.5 g/dL — ABNORMAL LOW (ref 13.0–17.0)
O2 Saturation: 96 %
O2 Saturation: 98 %
O2 Saturation: 96 %
O2 Saturation: 97 %
Patient temperature: 37.1
Patient temperature: 36.1
Patient temperature: 36.4
Potassium: 4 mmol/L (ref 3.5–5.1)
Potassium: 3.9 mmol/L (ref 3.5–5.1)
Potassium: 4.3 mmol/L (ref 3.5–5.1)
Potassium: 4.3 mmol/L (ref 3.5–5.1)
Sodium: 146 mmol/L — ABNORMAL HIGH (ref 135–145)
Sodium: 147 mmol/L — ABNORMAL HIGH (ref 135–145)
Sodium: 146 mmol/L — ABNORMAL HIGH (ref 135–145)
Sodium: 148 mmol/L — ABNORMAL HIGH (ref 135–145)
TCO2: 27 mmol/L (ref 22–32)
TCO2: 26 mmol/L (ref 22–32)
TCO2: 26 mmol/L (ref 22–32)
TCO2: 25 mmol/L (ref 22–32)
pCO2 arterial: 43.7 mmHg (ref 32.0–48.0)
pCO2 arterial: 49.6 mmHg — ABNORMAL HIGH (ref 32.0–48.0)
pCO2 arterial: 43 mmHg (ref 32.0–48.0)
pCO2 arterial: 45.2 mmHg (ref 32.0–48.0)
pH, Arterial: 7.378 (ref 7.350–7.450)
pH, Arterial: 7.305 — ABNORMAL LOW (ref 7.350–7.450)
pH, Arterial: 7.365 (ref 7.350–7.450)
pH, Arterial: 7.324 — ABNORMAL LOW (ref 7.350–7.450)
pO2, Arterial: 83 mmHg (ref 83.0–108.0)
pO2, Arterial: 118 mmHg — ABNORMAL HIGH (ref 83.0–108.0)
pO2, Arterial: 83 mmHg (ref 83.0–108.0)
pO2, Arterial: 95 mmHg (ref 83.0–108.0)

## 2018-07-12 LAB — PROTIME-INR
INR: 1.53
Prothrombin Time: 18.2 seconds — ABNORMAL HIGH (ref 11.4–15.2)

## 2018-07-12 LAB — PREPARE CRYOPRECIPITATE
Unit division: 0
Unit division: 0
Unit division: 0
Unit division: 0

## 2018-07-12 LAB — PREPARE PLATELET PHERESIS
Unit division: 0
Unit division: 0

## 2018-07-12 LAB — PREPARE RBC (CROSSMATCH)

## 2018-07-12 LAB — BASIC METABOLIC PANEL
Anion gap: 6 (ref 5–15)
BUN: 30 mg/dL — ABNORMAL HIGH (ref 6–20)
CO2: 24 mmol/L (ref 22–32)
Calcium: 6.5 mg/dL — ABNORMAL LOW (ref 8.9–10.3)
Chloride: 116 mmol/L — ABNORMAL HIGH (ref 98–111)
Creatinine, Ser: 1.26 mg/dL — ABNORMAL HIGH (ref 0.61–1.24)
GFR calc Af Amer: >60 mL/min (ref >60)
GFR calc non Af Amer: >60 mL/min (ref >60)
Glucose, Bld: 119 mg/dL — ABNORMAL HIGH (ref 70–99)
Potassium: 3.8 mmol/L (ref 3.5–5.1)
Sodium: 146 mmol/L — ABNORMAL HIGH (ref 135–145)

## 2018-07-12 LAB — BPAM PLATELET PHERESIS
Blood Product Expiration Date: 202001302359
Blood Product Expiration Date: 202001302359
ISSUE DATE / TIME: 202001290022
ISSUE DATE / TIME: 202001290117
Unit Type and Rh: 6200
Unit Type and Rh: 6200

## 2018-07-12 LAB — LACTIC ACID, PLASMA: Lactic Acid, Venous: 1.7 mmol/L (ref 0.5–1.9)

## 2018-07-12 SURGERY — LAPAROTOMY, EXPLORATORY
Anesthesia: General | Site: Thigh

## 2018-07-12 MED ORDER — KCL IN DEXTROSE-NACL 20-5-0.45 MEQ/L-%-% IV SOLN
INTRAVENOUS | Status: DC
  Administered 2018-07-12 – 2018-07-19 (×16): via INTRAVENOUS
  Filled 2018-07-12 (×16): qty 1000

## 2018-07-12 MED ORDER — POTASSIUM CHLORIDE 2 MEQ/ML IV SOLN: INTRAVENOUS | Status: DC

## 2018-07-12 MED ORDER — LACTATED RINGERS IV SOLN
INTRAVENOUS | Status: DC | PRN
  Administered 2018-07-12: 11:00:00 via INTRAVENOUS

## 2018-07-12 MED ORDER — SODIUM CHLORIDE 0.9% IV SOLUTION: Freq: Once | INTRAVENOUS | Status: DC

## 2018-07-12 MED ORDER — SODIUM CHLORIDE 0.9 % IV SOLN
2.0000 g | INTRAVENOUS | Status: DC
  Filled 2018-07-12: qty 20

## 2018-07-12 MED ORDER — SODIUM CHLORIDE 0.9 % IV SOLN: 10.0000 mL/h | Freq: Once | INTRAVENOUS | Status: DC

## 2018-07-12 MED ORDER — LORAZEPAM 2 MG/ML IJ SOLN
1.0000 mg | INTRAMUSCULAR | Status: DC | PRN
  Administered 2018-07-12 – 2018-08-22 (×67): 2 mg via INTRAVENOUS
  Administered 2018-08-23: 1 mg via INTRAVENOUS
  Administered 2018-08-23 – 2018-09-09 (×35): 2 mg via INTRAVENOUS
  Administered 2018-09-10: 1 mg via INTRAVENOUS
  Administered 2018-09-10: 2 mg via INTRAVENOUS
  Administered 2018-09-10: 1 mg via INTRAVENOUS
  Administered 2018-09-11 – 2018-10-15 (×48): 2 mg via INTRAVENOUS
  Filled 2018-07-12 (×163): qty 1

## 2018-07-12 MED ORDER — TOBRAMYCIN SULFATE 1.2 G IJ SOLR
INTRAMUSCULAR | Status: AC
  Filled 2018-07-12: qty 2.4

## 2018-07-12 MED ORDER — MIDAZOLAM HCL 2 MG/2ML IJ SOLN
INTRAMUSCULAR | Status: AC
  Filled 2018-07-12: qty 2

## 2018-07-12 MED ORDER — CALCIUM GLUCONATE-NACL 2-0.675 GM/100ML-% IV SOLN
2.0000 g | Freq: Once | INTRAVENOUS | Status: AC
  Administered 2018-07-12: 2000 mg via INTRAVENOUS
  Filled 2018-07-12: qty 100

## 2018-07-12 MED ORDER — BACITRACIN ZINC 500 UNIT/GM EX OINT
TOPICAL_OINTMENT | CUTANEOUS | Status: AC
  Filled 2018-07-12: qty 28.35

## 2018-07-12 MED ORDER — 0.9 % SODIUM CHLORIDE (POUR BTL) OPTIME
TOPICAL | Status: DC | PRN
  Administered 2018-07-12: 1000 mL

## 2018-07-12 MED ORDER — SODIUM CHLORIDE 0.9 % IR SOLN
Status: DC | PRN
  Administered 2018-07-12 (×3): 3000 mL

## 2018-07-12 MED ORDER — 0.9 % SODIUM CHLORIDE (POUR BTL) OPTIME
TOPICAL | Status: DC | PRN
  Administered 2018-07-12: 3000 mL

## 2018-07-12 MED ORDER — SODIUM CHLORIDE 0.9 % IV SOLN
INTRAVENOUS | Status: DC | PRN
  Administered 2018-07-12: 13:00:00 via INTRAVENOUS

## 2018-07-12 MED ORDER — FENTANYL CITRATE (PF) 250 MCG/5ML IJ SOLN
INTRAMUSCULAR | Status: AC
  Filled 2018-07-12: qty 5

## 2018-07-12 MED ORDER — VECURONIUM BROMIDE 10 MG IV SOLR
10.0000 mg | INTRAVENOUS | Status: DC | PRN
  Administered 2018-07-17 – 2018-07-21 (×2): 10 mg via INTRAVENOUS
  Filled 2018-07-12 (×2): qty 10

## 2018-07-12 MED ORDER — ALBUMIN HUMAN 5 % IV SOLN
INTRAVENOUS | Status: DC | PRN
  Administered 2018-07-12 (×2): via INTRAVENOUS

## 2018-07-12 MED ORDER — ROCURONIUM BROMIDE 10 MG/ML (PF) SYRINGE
PREFILLED_SYRINGE | INTRAVENOUS | Status: DC | PRN
  Administered 2018-07-12: 50 mg via INTRAVENOUS

## 2018-07-12 MED ORDER — METRONIDAZOLE IN NACL 500-0.74 MG/100ML-% IV SOLN
500.0000 mg | INTRAVENOUS | Status: DC
  Filled 2018-07-12: qty 100

## 2018-07-12 MED ORDER — VANCOMYCIN HCL 1000 MG IV SOLR
INTRAVENOUS | Status: AC
  Filled 2018-07-12: qty 3000

## 2018-07-12 MED ORDER — PHENYLEPHRINE 40 MCG/ML (10ML) SYRINGE FOR IV PUSH (FOR BLOOD PRESSURE SUPPORT)
PREFILLED_SYRINGE | INTRAVENOUS | Status: DC | PRN
  Administered 2018-07-12: 40 ug via INTRAVENOUS
  Administered 2018-07-12 (×4): 80 ug via INTRAVENOUS
  Administered 2018-07-12: 40 ug via INTRAVENOUS

## 2018-07-12 MED ORDER — FENTANYL CITRATE (PF) 100 MCG/2ML IJ SOLN
INTRAMUSCULAR | Status: DC | PRN
  Administered 2018-07-12: 100 ug via INTRAVENOUS
  Administered 2018-07-12: 50 ug via INTRAVENOUS
  Administered 2018-07-12: 100 ug via INTRAVENOUS

## 2018-07-12 MED ORDER — TOBRAMYCIN SULFATE 1.2 G IJ SOLR
INTRAMUSCULAR | Status: DC | PRN
  Administered 2018-07-12: 1.2 g
  Administered 2018-07-12: .01 g

## 2018-07-12 MED ORDER — MIDAZOLAM HCL 5 MG/5ML IJ SOLN
INTRAMUSCULAR | Status: DC | PRN
  Administered 2018-07-12: 2 mg via INTRAVENOUS

## 2018-07-12 MED ORDER — VANCOMYCIN HCL 1000 MG IV SOLR
INTRAVENOUS | Status: DC | PRN
  Administered 2018-07-12: .001 mg
  Administered 2018-07-12: 1000 mg
  Administered 2018-07-12: .01 mg

## 2018-07-12 SURGICAL SUPPLY — 110 items
ADH SKN CLS APL DERMABOND .7 (GAUZE/BANDAGES/DRESSINGS) ×1
APL SWBSTK 6 STRL LF DISP (MISCELLANEOUS) ×1
APPLICATOR COTTON TIP 6 STRL (MISCELLANEOUS) ×3 IMPLANT
APPLICATOR COTTON TIP 6IN STRL (MISCELLANEOUS) ×4
BENZOIN TINCTURE PRP APPL 2/3 (GAUZE/BANDAGES/DRESSINGS) ×8 IMPLANT
BIT DRILL CANN 4.5MM (BIT) ×3 IMPLANT
BIT DRILL CANN LRG QC 5X300 (BIT) ×4 IMPLANT
BLADE CLIPPER SURG (BLADE) IMPLANT
BNDG COHESIVE 6X5 TAN STRL LF (GAUZE/BANDAGES/DRESSINGS) ×4 IMPLANT
BNDG ELASTIC 6X10 VLCR STRL LF (GAUZE/BANDAGES/DRESSINGS) ×4 IMPLANT
BOWL SMART MIX CTS (DISPOSABLE) ×4 IMPLANT
BRUSH SCRUB SURG 4.25 DISP (MISCELLANEOUS) ×4 IMPLANT
CANISTER SUCT 3000ML PPV (MISCELLANEOUS) ×4 IMPLANT
CANISTER WOUND CARE 500ML ATS (WOUND CARE) ×4 IMPLANT
CANISTER WOUNDNEG PRESSURE 500 (CANNISTER) ×8 IMPLANT
CEMENT BONE SIMPLEX SPEEDSET (Cement) IMPLANT
CHLORAPREP W/TINT 26ML (MISCELLANEOUS) ×8 IMPLANT
COVER SURGICAL LIGHT HANDLE (MISCELLANEOUS) ×12 IMPLANT
COVER WAND RF STERILE (DRAPES) ×8 IMPLANT
DERMABOND ADVANCED (GAUZE/BANDAGES/DRESSINGS) ×1
DERMABOND ADVANCED .7 DNX12 (GAUZE/BANDAGES/DRESSINGS) ×3 IMPLANT
DRAPE C-ARMOR (DRAPES) ×4 IMPLANT
DRAPE IMP U-DRAPE 54X76 (DRAPES) ×8 IMPLANT
DRAPE INCISE IOBAN 66X45 STRL (DRAPES) ×24 IMPLANT
DRAPE LAPAROSCOPIC ABDOMINAL (DRAPES) ×4 IMPLANT
DRAPE ORTHO SPLIT 77X108 STRL (DRAPES) ×6
DRAPE PROXIMA HALF (DRAPES) ×8 IMPLANT
DRAPE STERI IOBAN 125X83 (DRAPES) ×4 IMPLANT
DRAPE SURG 17X23 STRL (DRAPES) ×4 IMPLANT
DRAPE SURG ORHT 6 SPLT 77X108 (DRAPES) ×6 IMPLANT
DRAPE U-SHAPE 47X51 STRL (DRAPES) ×4 IMPLANT
DRAPE UNIVERSAL PACK (DRAPES) ×8 IMPLANT
DRAPE UTILITY XL STRL (DRAPES) ×8 IMPLANT
DRAPE WARM FLUID 44X44 (DRAPE) ×4 IMPLANT
DRILL BIT CANN 4.5MM (BIT) ×1
DRSG MEPILEX BORDER 4X4 (GAUZE/BANDAGES/DRESSINGS) ×8 IMPLANT
DRSG MEPITEL 4X7.2 (GAUZE/BANDAGES/DRESSINGS) ×4 IMPLANT
DRSG OPSITE POSTOP 4X10 (GAUZE/BANDAGES/DRESSINGS) IMPLANT
DRSG OPSITE POSTOP 4X8 (GAUZE/BANDAGES/DRESSINGS) IMPLANT
DRSG VAC ATS LRG SENSATRAC (GAUZE/BANDAGES/DRESSINGS) ×4 IMPLANT
DRSG VAC ATS MED SENSATRAC (GAUZE/BANDAGES/DRESSINGS) ×4 IMPLANT
DRSG VERSA FOAM LRG 10X15 (GAUZE/BANDAGES/DRESSINGS) IMPLANT
ELECT BLADE 6.5 EXT (BLADE) IMPLANT
ELECT CAUTERY BLADE 6.4 (BLADE) ×4 IMPLANT
ELECT REM PT RETURN 9FT ADLT (ELECTROSURGICAL) ×8
ELECTRODE REM PT RTRN 9FT ADLT (ELECTROSURGICAL) ×6 IMPLANT
GAUZE SPONGE 4X4 12PLY STRL (GAUZE/BANDAGES/DRESSINGS) ×4 IMPLANT
GLOVE BIO SURGEON STRL SZ 6.5 (GLOVE) ×12 IMPLANT
GLOVE BIO SURGEON STRL SZ7.5 (GLOVE) ×16 IMPLANT
GLOVE BIO SURGEON STRL SZ8 (GLOVE) ×4 IMPLANT
GLOVE BIOGEL PI IND STRL 6.5 (GLOVE) ×3 IMPLANT
GLOVE BIOGEL PI IND STRL 7.5 (GLOVE) ×3 IMPLANT
GLOVE BIOGEL PI IND STRL 8 (GLOVE) ×3 IMPLANT
GLOVE BIOGEL PI INDICATOR 6.5 (GLOVE) ×1
GLOVE BIOGEL PI INDICATOR 7.5 (GLOVE) ×1
GLOVE BIOGEL PI INDICATOR 8 (GLOVE) ×1
GOWN STRL REUS W/ TWL LRG LVL3 (GOWN DISPOSABLE) ×9 IMPLANT
GOWN STRL REUS W/ TWL XL LVL3 (GOWN DISPOSABLE) ×3 IMPLANT
GOWN STRL REUS W/TWL LRG LVL3 (GOWN DISPOSABLE) ×6
GOWN STRL REUS W/TWL XL LVL3 (GOWN DISPOSABLE) ×1
GUIDEWIRE 2.0MM (WIRE) ×12 IMPLANT
GUIDEWIRE THREADED 2.8 (WIRE) ×12 IMPLANT
GUIDEWIRE THREADED 2.8MM (WIRE) ×4 IMPLANT
HANDPIECE INTERPULSE COAX TIP (DISPOSABLE) ×2
KIT BASIN OR (CUSTOM PROCEDURE TRAY) ×8 IMPLANT
KIT TURNOVER KIT B (KITS) ×8 IMPLANT
LIGASURE IMPACT 36 18CM CVD LR (INSTRUMENTS) IMPLANT
MANIFOLD NEPTUNE II (INSTRUMENTS) ×4 IMPLANT
MICROMATRIX 1000MG (Tissue) ×12 IMPLANT
NS IRRIG 1000ML POUR BTL (IV SOLUTION) ×12 IMPLANT
PACK GENERAL/GYN (CUSTOM PROCEDURE TRAY) ×8 IMPLANT
PAD ABD 8X10 STRL (GAUZE/BANDAGES/DRESSINGS) ×8 IMPLANT
PAD ARMBOARD 7.5X6 YLW CONV (MISCELLANEOUS) ×12 IMPLANT
PAD NEG PRESSURE SENSATRAC (MISCELLANEOUS) ×4 IMPLANT
PADDING CAST COTTON 6X4 STRL (CAST SUPPLIES) ×4 IMPLANT
PENCIL SMOKE EVACUATOR (MISCELLANEOUS) ×4 IMPLANT
SCREW BONE CANN 7.3X95MM F/TH (Screw) ×4 IMPLANT
SCREW CANN 6.5X105 32MM THRD (Screw) ×4 IMPLANT
SCREW CANN 6.5X140X32 (Screw) ×4 IMPLANT
SCREW CANN 6.5X90 (Screw) ×8 IMPLANT
SCREW CANN 6.5X90 32MM THD (Screw) ×6 IMPLANT
SCREW CANN 7.3 THRD 170 STER (Screw) ×4 IMPLANT
SCREW SHANZ 5X250MM (Screw) ×4 IMPLANT
SET HNDPC FAN SPRY TIP SCT (DISPOSABLE) ×3 IMPLANT
SOLUTION PARTIC MCRMTRX 1000MG (Tissue) ×9 IMPLANT
SPECIMEN JAR LARGE (MISCELLANEOUS) IMPLANT
SPONGE ABD ABTHERA ADVANCE (MISCELLANEOUS) ×4 IMPLANT
SPONGE ABDOMINAL VAC ABTHERA (MISCELLANEOUS) ×4 IMPLANT
SPONGE LAP 18X18 X RAY DECT (DISPOSABLE) ×12 IMPLANT
STAPLER VISISTAT 35W (STAPLE) ×4 IMPLANT
STOCKINETTE IMPERVIOUS LG (DRAPES) ×4 IMPLANT
SUCTION POOLE TIP (SUCTIONS) ×4 IMPLANT
SUT ETHILON 2 0 PSLX (SUTURE) ×8 IMPLANT
SUT MNCRL AB 3-0 PS2 18 (SUTURE) ×8 IMPLANT
SUT MON AB 2-0 CT1 36 (SUTURE) ×4 IMPLANT
SUT PDS AB 1 TP1 96 (SUTURE) ×8 IMPLANT
SUT PROLENE 0 CT 1 30 (SUTURE) ×8 IMPLANT
SUT SILK 2 0 SH CR/8 (SUTURE) ×4 IMPLANT
SUT SILK 2 0 TIES 10X30 (SUTURE) ×4 IMPLANT
SUT SILK 3 0 SH CR/8 (SUTURE) ×4 IMPLANT
SUT SILK 3 0 TIES 10X30 (SUTURE) ×4 IMPLANT
SUT VIC AB 2-0 CT1 27 (SUTURE) ×1
SUT VIC AB 2-0 CT1 TAPERPNT 27 (SUTURE) ×3 IMPLANT
TIP HIGH FLOW IRRIGATION COAX (MISCELLANEOUS) ×4 IMPLANT
TOWEL OR 17X24 6PK STRL BLUE (TOWEL DISPOSABLE) ×12 IMPLANT
TOWEL OR 17X26 10 PK STRL BLUE (TOWEL DISPOSABLE) ×12 IMPLANT
TRAY FOLEY MTR SLVR 16FR STAT (SET/KITS/TRAYS/PACK) IMPLANT
WASHER FOR 5.0 SCREWS (Washer) ×8 IMPLANT
WATER STERILE IRR 1000ML POUR (IV SOLUTION) ×4 IMPLANT
YANKAUER SUCT BULB TIP NO VENT (SUCTIONS) ×4 IMPLANT
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
## 2018-07-12 NOTE — Plan of Care (Signed)
?  Problem: Elimination: ?Goal: Will not experience complications related to urinary retention ?Outcome: Progressing ?  ?

## 2018-07-12 NOTE — Anesthesia Postprocedure Evaluation (Signed)
Anesthesia Post Note  Patient: Christopher [name]  Procedure(s) Performed: IR WITH ANESTHESIA (N/A ) WOUND EXPLORATION LEFT GROIN (Left Groin) Ir With Anesthesia (Right Abdomen) Exploratory Laparotomy (Abdomen) Application Of Wound Vac (Abdomen)     Patient location during evaluation: SICU Anesthesia Type: General Level of consciousness: sedated Pain management: pain level controlled Vital Signs Assessment: post-procedure vital signs reviewed and stable Respiratory status: patient remains intubated per anesthesia plan Cardiovascular status: stable Postop Assessment: no apparent nausea or vomiting Anesthetic complications: no    Last Vitals:  Vitals:   07/12/18 0600 07/12/18 0700  BP: 107/69 105/80  Pulse: (!) 101 100  Resp: 18 18  Temp: 36.7 C (!) 36.4 C  SpO2: 92% 90%    Last Pain:  Vitals:   07/11/18 1800  TempSrc: Esophageal  PainSc:                  Brittane Grudzinski S

## 2018-07-12 NOTE — Progress Notes (Signed)
Patient ID: Christopher [name], male   DOB: [birth year], 53 y.o.   MRN: [id number] I spoke with his fiancee and brother about the planned surgery and answered their questions. Consent done yesterday.  Violeta Gelinas, MD, MPH, FACS Trauma: [phone] General Surgery: [phone]  07/12/2018 10:23 AM
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
## 2018-07-12 NOTE — Progress Notes (Signed)
RT NOTE: RT unable to do Q4 vent check due to patient being in OR.

## 2018-07-12 NOTE — Anesthesia Postprocedure Evaluation (Signed)
Anesthesia Post Note  Patient: Christopher [name]  Procedure(s) Performed: EXPLORATORY LAPAROTOMY (N/A Abdomen) ABDOMINAL VACUUM ASSISTED CLOSURE CHANGE and abdominal washout (N/A Abdomen) CANNULATED HIP PINNING (Left ) Application Of Wound Vac to the Left Thigh and Scrotum. (Thigh)     Patient location during evaluation: SICU Anesthesia Type: General Level of consciousness: sedated Pain management: pain level controlled Vital Signs Assessment: post-procedure vital signs reviewed and stable Respiratory status: patient remains intubated per anesthesia plan Cardiovascular status: stable Postop Assessment: no apparent nausea or vomiting Anesthetic complications: no    Last Vitals:  Vitals:   07/12/18 1000 07/12/18 1550  BP: 113/76 134/70  Pulse: (!) 104 94  Resp: 18 (!) 24  Temp: 37 C   SpO2: 93% 95%    Last Pain:  Vitals:   07/12/18 0800  TempSrc: Esophageal  PainSc:                  Venice Liz,W. EDMOND
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
## 2018-07-12 NOTE — OR Nursing (Signed)
Patient entered the OR with intentional packing placed in left thigh wound on the 07/10/2018 from a previous procedure. Prior to start of procedure one, Dr. Jena Gauss removed two lap sponges from the patients thigh wound at 10:58. After the end of the first portion of the procedure Dr. Elwyn Lade attempted to retrieve the remaining lap sponges that the patient's wound had been packed with at 11:28. Once Dr. Janee Morn removed all that were thought to be in the wound (Six more sponges). We used the sponge detection system and it reported there was still a detection of a sponge. We called X-Ray and had them X-ray the patient to rule out ant other sponge being left in at 11:50. Upon X-ray being taken we found there was another sponge that remained in the patient. Dr. Jena Gauss was able to remove the one sponge and took another X-ray to make sure that there were no others. No more sponges were present upon further xray at 11:56. Nine sponges were removed in total.

## 2018-07-12 NOTE — Transfer of Care (Signed)
Immediate Anesthesia Transfer of Care Note  Patient: Dryden Jarrow Kann  Procedure(s) Performed: EXPLORATORY LAPAROTOMY (N/A Abdomen) ABDOMINAL VACUUM ASSISTED CLOSURE CHANGE and abdominal washout (N/A Abdomen) CANNULATED HIP PINNING (Left ) Application Of Wound Vac to the Left Thigh and Scrotum. (Thigh)  Patient Location: ICU  Anesthesia Type:General  Level of Consciousness: sedated and Patient remains intubated per anesthesia plan  Airway & Oxygen Therapy: Patient remains intubated per anesthesia plan and Patient placed on Ventilator (see vital sign flow sheet for setting)  Post-op Assessment: Report given to RN and Post -op Vital signs reviewed and stable  Post vital signs: Reviewed and stable  Last Vitals:  Vitals Value Taken Time  BP 134/70 07/12/2018  3:50 PM  Temp    Pulse 94 07/12/2018  3:50 PM  Resp 24 07/12/2018  3:50 PM  SpO2 95 % 07/12/2018  3:50 PM    Last Pain:  Vitals:   07/12/18 0800  TempSrc: Esophageal  PainSc:          Complications: No apparent anesthesia complications

## 2018-07-13 ENCOUNTER — Inpatient Hospital Stay (HOSPITAL_COMMUNITY): Payer: No Typology Code available for payment source

## 2018-07-13 DIAGNOSIS — R58 Hemorrhage, not elsewhere classified: Secondary | ICD-10-CM

## 2018-07-13 DIAGNOSIS — S81809A Unspecified open wound, unspecified lower leg, initial encounter: Secondary | ICD-10-CM

## 2018-07-13 DIAGNOSIS — S3730XA Unspecified injury of urethra, initial encounter: Secondary | ICD-10-CM

## 2018-07-13 DIAGNOSIS — S3994XA Unspecified injury of external genitals, initial encounter: Secondary | ICD-10-CM

## 2018-07-13 LAB — POCT I-STAT 7, (LYTES, BLD GAS, ICA,H+H)
Bicarbonate: 24.5 mmol/L (ref 20.0–28.0)
Calcium, Ion: 1.07 mmol/L — ABNORMAL LOW (ref 1.15–1.40)
HCT: 25 % — ABNORMAL LOW (ref 39.0–52.0)
Hemoglobin: 8.5 g/dL — ABNORMAL LOW (ref 13.0–17.0)
O2 Saturation: 97 %
Patient temperature: 98.6
Potassium: 3.7 mmol/L (ref 3.5–5.1)
Sodium: 144 mmol/L (ref 135–145)
TCO2: 26 mmol/L (ref 22–32)
pCO2 arterial: 39.3 mmHg (ref 32.0–48.0)
pH, Arterial: 7.403 (ref 7.350–7.450)
pO2, Arterial: 88 mmHg (ref 83.0–108.0)

## 2018-07-13 LAB — CBC
HCT: 26.4 % — ABNORMAL LOW (ref 39.0–52.0)
HCT: 26.4 % — ABNORMAL LOW (ref 39.0–52.0)
Hemoglobin: 8.9 g/dL — ABNORMAL LOW (ref 13.0–17.0)
Hemoglobin: 9 g/dL — ABNORMAL LOW (ref 13.0–17.0)
MCH: 30.1 pg (ref 26.0–34.0)
MCH: 30.1 pg (ref 26.0–34.0)
MCHC: 33.7 g/dL (ref 30.0–36.0)
MCHC: 34.1 g/dL (ref 30.0–36.0)
MCV: 88.3 fL (ref 80.0–100.0)
MCV: 89.2 fL (ref 80.0–100.0)
Platelets: 65 10*3/uL — ABNORMAL LOW (ref 150–400)
Platelets: 65 10*3/uL — ABNORMAL LOW (ref 150–400)
RBC: 2.96 MIL/uL — ABNORMAL LOW (ref 4.22–5.81)
RBC: 2.99 MIL/uL — ABNORMAL LOW (ref 4.22–5.81)
RDW: 15.9 % — ABNORMAL HIGH (ref 11.5–15.5)
RDW: 16.1 % — ABNORMAL HIGH (ref 11.5–15.5)
WBC: 6 10*3/uL (ref 4.0–10.5)
WBC: 6.7 10*3/uL (ref 4.0–10.5)
nRBC: 0 % (ref 0.0–0.2)
nRBC: 0 % (ref 0.0–0.2)

## 2018-07-13 LAB — BASIC METABOLIC PANEL
Anion gap: 3 — ABNORMAL LOW (ref 5–15)
BUN: 28 mg/dL — ABNORMAL HIGH (ref 6–20)
CO2: 26 mmol/L (ref 22–32)
Calcium: 6.9 mg/dL — ABNORMAL LOW (ref 8.9–10.3)
Chloride: 118 mmol/L — ABNORMAL HIGH (ref 98–111)
Creatinine, Ser: 0.93 mg/dL (ref 0.61–1.24)
GFR calc Af Amer: >60 mL/min (ref >60)
GFR calc non Af Amer: >60 mL/min (ref >60)
Glucose, Bld: 141 mg/dL — ABNORMAL HIGH (ref 70–99)
Potassium: 4.1 mmol/L (ref 3.5–5.1)
Sodium: 147 mmol/L — ABNORMAL HIGH (ref 135–145)

## 2018-07-13 LAB — GLUCOSE, CAPILLARY
Glucose-Capillary: 143 mg/dL — ABNORMAL HIGH (ref 70–99)
Glucose-Capillary: 134 mg/dL — ABNORMAL HIGH (ref 70–99)
Glucose-Capillary: 116 mg/dL — ABNORMAL HIGH (ref 70–99)
Glucose-Capillary: 143 mg/dL — ABNORMAL HIGH (ref 70–99)

## 2018-07-13 MED ORDER — ACETAMINOPHEN 160 MG/5ML PO SOLN
650.0000 mg | Freq: Four times a day (QID) | ORAL | Status: DC
  Administered 2018-07-13 – 2018-10-16 (×366): 650 mg
  Filled 2018-07-13 (×355): qty 20.3

## 2018-07-13 MED ORDER — PIVOT 1.5 CAL PO LIQD
1000.0000 mL | ORAL | Status: DC
  Administered 2018-07-13 – 2018-07-16 (×3): 1000 mL

## 2018-07-13 MED ORDER — ADULT MULTIVITAMIN W/MINERALS CH
1.0000 | ORAL_TABLET | Freq: Every day | ORAL | Status: DC
  Administered 2018-07-13 – 2018-10-09 (×79): 1
  Filled 2018-07-13 (×80): qty 1

## 2018-07-13 MED ORDER — SODIUM CHLORIDE 0.9 % IV SOLN
INTRAVENOUS | Status: DC | PRN
  Administered 2018-07-13 – 2018-08-13 (×3): 250 mL via INTRAVENOUS
  Administered 2018-08-18 – 2018-08-20 (×2): 1000 mL via INTRAVENOUS
  Administered 2018-08-22 – 2018-08-24 (×2): 250 mL via INTRAVENOUS
  Administered 2018-08-28 – 2018-08-29 (×2): 500 mL via INTRAVENOUS
  Administered 2018-08-31 – 2018-09-01 (×2): 1000 mL via INTRAVENOUS
  Administered 2018-09-04: 900 mL via INTRAVENOUS
  Administered 2018-09-08: 500 mL via INTRAVENOUS
  Administered 2018-09-10: 1000 mL via INTRAVENOUS
  Administered 2018-09-12 – 2018-09-14 (×2): 500 mL via INTRAVENOUS

## 2018-07-13 MED ORDER — VITAL HIGH PROTEIN PO LIQD
1000.0000 mL | ORAL | Status: AC
  Administered 2018-07-13: 1000 mL

## 2018-07-13 MED ORDER — CALCIUM GLUCONATE-NACL 2-0.675 GM/100ML-% IV SOLN
2.0000 g | Freq: Once | INTRAVENOUS | Status: AC
  Administered 2018-07-13: 2000 mg via INTRAVENOUS
  Filled 2018-07-13: qty 100

## 2018-07-13 MED ORDER — GABAPENTIN 250 MG/5ML PO SOLN
300.0000 mg | Freq: Three times a day (TID) | ORAL | Status: DC
  Administered 2018-07-13 – 2018-07-28 (×43): 300 mg via ORAL
  Filled 2018-07-13 (×54): qty 6
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
## 2018-07-13 NOTE — Progress Notes (Signed)
RT NOTE: RT transported patient on ventilator from room 4N18 to CT and back to room 4N18 with no apparent complications. Vitals are stable. RT will continue to monitor.
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
## 2018-07-14 LAB — BPAM RBC
Blood Product Expiration Date: 202002012359
Blood Product Expiration Date: 202002012359
Blood Product Expiration Date: 202002282359
Blood Product Expiration Date: 202002282359
Blood Product Expiration Date: 202002282359
Blood Product Expiration Date: 202002282359
Blood Product Expiration Date: 202002292359
Blood Product Expiration Date: 202002292359
Blood Product Expiration Date: 202002292359
Blood Product Expiration Date: 202002292359
Blood Product Expiration Date: 202002292359
Blood Product Expiration Date: 202002292359
Blood Product Expiration Date: 202002292359
Blood Product Expiration Date: 202002292359
Blood Product Expiration Date: 202002292359
Blood Product Expiration Date: 202002292359
Blood Product Expiration Date: 202002292359
Blood Product Expiration Date: 202003012359
Blood Product Expiration Date: 202003012359
Blood Product Expiration Date: 202003012359
Blood Product Expiration Date: 202003012359
Blood Product Expiration Date: 202003012359
Blood Product Expiration Date: 202003012359
Blood Product Expiration Date: 202003012359
Blood Product Expiration Date: 202003022359
Blood Product Expiration Date: 202003022359
Blood Product Expiration Date: 202003022359
Blood Product Expiration Date: 202003022359
Blood Product Expiration Date: 202003022359
Blood Product Expiration Date: 202003022359
Blood Product Expiration Date: 202003022359
Blood Product Expiration Date: 202003022359
Blood Product Expiration Date: 202003022359
Blood Product Expiration Date: 202003022359
Blood Product Expiration Date: 202003022359
Blood Product Expiration Date: 202003022359
Blood Product Expiration Date: 202003022359
Blood Product Expiration Date: 202003032359
Blood Product Expiration Date: 202003032359
Blood Product Expiration Date: 202003032359
Blood Product Expiration Date: 202003032359
Blood Product Expiration Date: 202003042359
ISSUE DATE / TIME: 202001281727
ISSUE DATE / TIME: 202001281727
ISSUE DATE / TIME: 202001281803
ISSUE DATE / TIME: 202001281803
ISSUE DATE / TIME: 202001281820
ISSUE DATE / TIME: 202001281820
ISSUE DATE / TIME: 202001281828
ISSUE DATE / TIME: 202001281828
ISSUE DATE / TIME: 202001281843
ISSUE DATE / TIME: 202001281843
ISSUE DATE / TIME: 202001281846
ISSUE DATE / TIME: 202001281846
ISSUE DATE / TIME: 202001281853
ISSUE DATE / TIME: 202001281853
ISSUE DATE / TIME: 202001281858
ISSUE DATE / TIME: 202001281858
ISSUE DATE / TIME: 202001281916
ISSUE DATE / TIME: 202001281916
ISSUE DATE / TIME: 202001281934
ISSUE DATE / TIME: 202001281934
ISSUE DATE / TIME: 202001281934
ISSUE DATE / TIME: 202001281934
ISSUE DATE / TIME: 202001281946
ISSUE DATE / TIME: 202001281946
ISSUE DATE / TIME: 202001281946
ISSUE DATE / TIME: 202001281946
ISSUE DATE / TIME: 202001282035
ISSUE DATE / TIME: 202001282035
ISSUE DATE / TIME: 202001282035
ISSUE DATE / TIME: 202001282035
ISSUE DATE / TIME: 202001290501
ISSUE DATE / TIME: 202001290733
ISSUE DATE / TIME: 202001290937
ISSUE DATE / TIME: 202001291231
ISSUE DATE / TIME: 202001291258
ISSUE DATE / TIME: 202001300548
ISSUE DATE / TIME: 202001300819
ISSUE DATE / TIME: 202001301112
ISSUE DATE / TIME: 202001301112
ISSUE DATE / TIME: 202001301405
Unit Type and Rh: 5100
Unit Type and Rh: 5100
Unit Type and Rh: 5100
Unit Type and Rh: 5100
Unit Type and Rh: 5100
Unit Type and Rh: 5100
Unit Type and Rh: 5100
Unit Type and Rh: 5100
Unit Type and Rh: 5100
Unit Type and Rh: 5100
Unit Type and Rh: 5100
Unit Type and Rh: 5100
Unit Type and Rh: 5100
Unit Type and Rh: 5100
Unit Type and Rh: 5100
Unit Type and Rh: 5100
Unit Type and Rh: 5100
Unit Type and Rh: 5100
Unit Type and Rh: 5100
Unit Type and Rh: 5100
Unit Type and Rh: 5100
Unit Type and Rh: 5100
Unit Type and Rh: 5100
Unit Type and Rh: 5100
Unit Type and Rh: 5100
Unit Type and Rh: 5100
Unit Type and Rh: 5100
Unit Type and Rh: 5100
Unit Type and Rh: 5100
Unit Type and Rh: 5100
Unit Type and Rh: 5100
Unit Type and Rh: 5100
Unit Type and Rh: 5100
Unit Type and Rh: 5100
Unit Type and Rh: 5100
Unit Type and Rh: 5100
Unit Type and Rh: 5100
Unit Type and Rh: 5100
Unit Type and Rh: 5100
Unit Type and Rh: 5100
Unit Type and Rh: 9500
Unit Type and Rh: 9500

## 2018-07-14 LAB — TYPE AND SCREEN
ABO/RH(D): O POS
Antibody Screen: NEGATIVE
Unit division: 0
Unit division: 0
Unit division: 0
Unit division: 0
Unit division: 0
Unit division: 0
Unit division: 0
Unit division: 0
Unit division: 0
Unit division: 0
Unit division: 0
Unit division: 0
Unit division: 0
Unit division: 0
Unit division: 0
Unit division: 0
Unit division: 0
Unit division: 0
Unit division: 0
Unit division: 0
Unit division: 0
Unit division: 0
Unit division: 0
Unit division: 0
Unit division: 0
Unit division: 0
Unit division: 0
Unit division: 0
Unit division: 0
Unit division: 0
Unit division: 0
Unit division: 0
Unit division: 0
Unit division: 0
Unit division: 0
Unit division: 0
Unit division: 0
Unit division: 0
Unit division: 0
Unit division: 0
Unit division: 0
Unit division: 0

## 2018-07-14 LAB — GLUCOSE, CAPILLARY
Glucose-Capillary: 143 mg/dL — ABNORMAL HIGH (ref 70–99)
Glucose-Capillary: 118 mg/dL — ABNORMAL HIGH (ref 70–99)
Glucose-Capillary: 144 mg/dL — ABNORMAL HIGH (ref 70–99)
Glucose-Capillary: 149 mg/dL — ABNORMAL HIGH (ref 70–99)
Glucose-Capillary: 153 mg/dL — ABNORMAL HIGH (ref 70–99)
Glucose-Capillary: 133 mg/dL — ABNORMAL HIGH (ref 70–99)

## 2018-07-14 LAB — BASIC METABOLIC PANEL
Anion gap: 5 (ref 5–15)
BUN: 24 mg/dL — ABNORMAL HIGH (ref 6–20)
CO2: 24 mmol/L (ref 22–32)
Calcium: 7.4 mg/dL — ABNORMAL LOW (ref 8.9–10.3)
Chloride: 118 mmol/L — ABNORMAL HIGH (ref 98–111)
Creatinine, Ser: 0.73 mg/dL (ref 0.61–1.24)
GFR calc Af Amer: >60 mL/min (ref >60)
GFR calc non Af Amer: >60 mL/min (ref >60)
Glucose, Bld: 140 mg/dL — ABNORMAL HIGH (ref 70–99)
Potassium: 3.9 mmol/L (ref 3.5–5.1)
Sodium: 147 mmol/L — ABNORMAL HIGH (ref 135–145)

## 2018-07-14 LAB — CBC
HCT: 25.8 % — ABNORMAL LOW (ref 39.0–52.0)
Hemoglobin: 8.2 g/dL — ABNORMAL LOW (ref 13.0–17.0)
MCH: 28.9 pg (ref 26.0–34.0)
MCHC: 31.8 g/dL (ref 30.0–36.0)
MCV: 90.8 fL (ref 80.0–100.0)
Platelets: 81 10*3/uL — ABNORMAL LOW (ref 150–400)
RBC: 2.84 MIL/uL — ABNORMAL LOW (ref 4.22–5.81)
RDW: 16.5 % — ABNORMAL HIGH (ref 11.5–15.5)
WBC: 7.9 10*3/uL (ref 4.0–10.5)
nRBC: 0 % (ref 0.0–0.2)

## 2018-07-14 LAB — TRIGLYCERIDES: Triglycerides: 173 mg/dL — ABNORMAL HIGH (ref <150)

## 2018-07-14 LAB — MAGNESIUM: Magnesium: 2.2 mg/dL (ref 1.7–2.4)

## 2018-07-14 NOTE — Progress Notes (Signed)
SPORTS MEDICINE AND JOINT REPLACEMENT  Georgena SpurlingStephen Lucey, MD    Laurier Nancyolby Kayte Borchard, PA-C [address], [place], [place]  [id number]                             [phone]   PROGRESS NOTE  Subjective:  Patient intubated and sedated and no family at bedside  Objective: Vital signs in last 24 hours:    Patient Vitals for the past 24 hrs:  BP Temp Temp src Pulse Resp SpO2 Weight  07/14/18 0801 - - - (!) 110 20 98 % -  07/14/18 0700 117/68 99 F (37.2 C) - (!) 101 18 100 % -  07/14/18 0630 112/65 98.6 F (37 C) - 100 19 100 % -  07/14/18 0600 113/65 99 F (37.2 C) - 100 18 100 % -  07/14/18 0530 (!) 138/91 99.1 F (37.3 C) - (!) 113 (!) 21 100 % -  07/14/18 0524 - - - - - - 82.7 kg  07/14/18 0500 98/67 98.8 F (37.1 C) - 97 17 100 % -  07/14/18 0430 109/61 98.8 F (37.1 C) - 98 17 100 % -  07/14/18 0400 108/62 99 F (37.2 C) - 97 17 100 % -  07/14/18 0330 107/64 99.1 F (37.3 C) - 99 18 99 % -  07/14/18 0324 - 99.3 F (37.4 C) - (!) 102 17 99 % -  07/14/18 0300 118/60 99.5 F (37.5 C) - (!) 104 17 99 % -  07/14/18 0230 114/67 99.5 F (37.5 C) - (!) 103 18 100 % -  07/14/18 0200 111/63 99.3 F (37.4 C) - (!) 103 17 100 % -  07/14/18 0130 115/71 99.3 F (37.4 C) - (!) 104 19 100 % -  07/14/18 0100 116/69 99.1 F (37.3 C) - (!) 101 16 100 % -  07/14/18 0030 117/65 99.1 F (37.3 C) - (!) 102 17 100 % -  07/14/18 0000 110/68 99 F (37.2 C) Esophageal 100 17 100 % -  07/13/18 2330 112/65 99 F (37.2 C) - 99 17 100 % -  07/13/18 2310 - 99 F (37.2 C) - 99 19 100 % -  07/13/18 2300 109/64 98.8 F (37.1 C) - 99 16 100 % -  07/13/18 2230 109/60 98.8 F (37.1 C) - 100 17 100 % -  07/13/18 2200 117/67 98.8 F (37.1 C) - 100 17 100 % -  07/13/18 2130 111/63 98.8 F (37.1 C) - 98 18 100 % -  07/13/18 2100 107/65 99 F (37.2 C) - (!) 101 18 99 % -  07/13/18 2030 108/63 99 F (37.2 C) - 100 17 99 % -  07/13/18 2000 111/65 99.3 F (37.4 C) Esophageal (!) 102 18 98 % -   07/13/18 1930 120/66 99.1 F (37.3 C) - (!) 105 17 99 % -  07/13/18 1919 - 99 F (37.2 C) - (!) 104 19 100 % -  07/13/18 1900 113/68 98.8 F (37.1 C) - 99 18 100 % -  07/13/18 1800 105/68 98.6 F (37 C) - 100 18 100 % -  07/13/18 1700 111/67 98.6 F (37 C) - (!) 101 18 99 % -  07/13/18 1600 120/64 98.4 F (36.9 C) - (!) 102 17 100 % -  07/13/18 1530 - - - - - 100 % -  07/13/18 1529 111/67 - - (!) 105 19 100 % -  07/13/18 1500 111/67 98.4 F (  36.9 C) - (!) 106 18 100 % -  07/13/18 1400 108/61 98.4 F (36.9 C) - (!) 101 18 100 % -  07/13/18 1300 104/63 98.2 F (36.8 C) - 100 18 100 % -  07/13/18 1200 105/64 98.6 F (37 C) Esophageal 98 18 99 % -  07/13/18 1108 - - - 99 18 100 % -  07/13/18 1100 120/71 - - - - - -  07/13/18 1000 117/70 99 F (37.2 C) - (!) 103 18 98 % -  07/13/18 0900 132/88 99 F (37.2 C) - (!) 116 18 97 % -    @flow {1959:LAST@   Intake/Output from previous day:   01/31 0701 - 02/01 0700 In: 4609 [I.V.:3508.2] Out: 4940 [Urine:1460; Drains:3480]   Intake/Output this shift:   02/01 0701 - 02/01 1900 In: 218.6 [I.V.:168.6] Out: 150 [Urine:150]   Intake/Output      01/31 0701 - 02/01 0700 02/01 0701 - 02/02 0700   I.V. (mL/kg) 3508.2 (42.4) 168.6 (2)   Blood     NG/GT 502.3 50   IV Piggyback 598.4    Total Intake(mL/kg) 4609 (55.7) 218.6 (2.6)   Urine (mL/kg/hr) 1460 (0.7) 150 (1)   Drains 3480    Blood     Total Output 4940 150   Net -331 +68.6           LABORATORY DATA: Recent Labs    07/12/18 0148 07/12/18 0426  07/12/18 0924  07/12/18 1356 07/12/18 1455 07/12/18 1830 07/13/18 0035 07/13/18 0235 07/13/18 0600 07/14/18 0137  WBC 9.6 8.6  --  9.0  --   --   --  5.5 6.0  --  6.7 7.9  HGB 8.3* 8.0*   < > 7.9*   < > 7.8* 8.5* 9.8* 9.0* 8.5* 8.9* 8.2*  HCT 25.0* 23.6*   < > 23.3*   < > 23.0* 25.0* 29.5* 26.4* 25.0* 26.4* 25.8*  PLT 73* 76*  --  78*  --   --   --  62* 65*  --  65* 81*   < > = values in this interval not displayed.    Recent Labs    07/10/18 1813  07/11/18 0343 07/12/18 0426 07/12/18 0914 07/12/18 1217 07/12/18 1356 07/12/18 1455 07/13/18 0235 07/13/18 0600 07/14/18 0137  NA 140   < > 147* 146* 146* 147* 146* 148* 144 147* 147*  K 2.9*   < > 3.4* 3.8 4.0 3.9 4.3 4.3 3.7 4.1 3.9  CL 116*  --  112* 116*  --   --   --   --   --  118* 118*  CO2 16*  --  23 24  --   --   --   --   --  26 24  BUN 18  --  16 30*  --   --   --   --   --  28* 24*  CREATININE 0.90  --  1.03 1.26*  --   --   --   --   --  0.93 0.73  GLUCOSE 175*  --  173* 119*  --   --   --   --   --  141* 140*  CALCIUM 7.6*  --  6.7* 6.5*  --   --   --   --   --  6.9* 7.4*   < > = values in this interval not displayed.   Lab Results  Component Value Date   INR 1.53 07/12/2018   INR  1.52 07/11/2018   INR 1.39 07/10/2018      Assessment:    2 Days Post-Op  Procedure(s) (LRB): EXPLORATORY LAPAROTOMY (N/A) ABDOMINAL VACUUM ASSISTED CLOSURE CHANGE and abdominal washout (N/A) CANNULATED HIP PINNING (Left) Application Of Wound Vac to the Left Thigh and Scrotum.  ADDITIONAL DIAGNOSIS:  Active Problems:   Fracture of femoral neck, left (HCC)   Multiple fractures of pelvis with unstable disruption of pelvic ring, initial encounter for open fracture (HCC)   Acute hemorrhage   Degloving injury of lower leg, initial encounter   Urethral injury   Scrotal injury, initial encounter     Plan:  Non Weight Bearing (NWB)  DVT Prophylaxis:  per trauma team  Plastics taking back to OR next week, will continue to follow through weekend        Guy SandiferColby Alan Deren Degrazia 07/14/2018, 8:46 AM

## 2018-07-14 NOTE — Progress Notes (Signed)
2 Days Post-Op Subjective: Patient stable on ventilatory support.  Objective: Vital signs in last 24 hours: Temp:  [98.4 F (36.9 C)-99.9 F (37.7 C)] 99.9 F (37.7 C) (02/01 1300) Pulse Rate:  [97-113] 106 (02/01 1300) Resp:  [16-21] 16 (02/01 1300) BP: (98-138)/(60-91) 116/65 (02/01 1300) SpO2:  [98 %-100 %] 100 % (02/01 1300) Arterial Line BP: (113-170)/(50-67) 142/57 (02/01 1300) FiO2 (%):  [40 %] 40 % (02/01 1206) Weight:  [82.7 kg] 82.7 kg (02/01 0524)  Intake/Output from previous day: 01/31 0701 - 02/01 0700 In: 4609 [I.V.:3508.2; NG/GT:502.3; IV Piggyback:598.4] Out: 4940 [Urine:1460; Drains:3480] Intake/Output this shift: Total I/O In: 895.2 [I.V.:845.2; NG/GT:50] Out: 500 [Urine:500]  Physical Exam:  Constitutional: Vital signs reviewed.  On ventilator, sedated  VAC dressing intact.  Scrotum unable to be evaluated.  Lab Results: Recent Labs    07/13/18 0235 07/13/18 0600 07/14/18 0137  HGB 8.5* 8.9* 8.2*  HCT 25.0* 26.4* 25.8*   BMET Recent Labs    07/13/18 0600 07/14/18 0137  NA 147* 147*  K 4.1 3.9  CL 118* 118*  CO2 26 24  GLUCOSE 141* 140*  BUN 28* 24*  CREATININE 0.93 0.73  CALCIUM 6.9* 7.4*   Recent Labs    07/12/18 0426  INR 1.53   No results for input(s): LABURIN in the last 72 hours. Results for orders placed or performed during the hospital encounter of 07/10/18  MRSA PCR Screening     Status: None   Collection Time: 07/11/18  1:47 AM  Result Value Ref Range Status   MRSA by PCR NEGATIVE NEGATIVE Final    Comment:        The GeneXpert MRSA Assay (FDA approved for NASAL specimens only), is one component of a comprehensive MRSA colonization surveillance program. It is not intended to diagnose MRSA infection nor to guide or monitor treatment for MRSA infections. Performed at [facility], [address], [place], [place] [address]   Surgical pcr screen     Status: None   Collection Time: 07/12/18  8:11 AM  Result  Value Ref Range Status   MRSA, PCR NEGATIVE NEGATIVE Final   Staphylococcus aureus NEGATIVE NEGATIVE Final    Comment: (NOTE) The Xpert SA Assay (FDA approved for NASAL specimens in patients 67 years of age and older), is one component of a comprehensive surveillance program. It is not intended to diagnose infection nor to guide or monitor treatment. Performed at [facility], [address] [place], [place] [address]     Studies/Results: Ct Pelvis Wo Contrast  Result Date: 07/13/2018 CLINICAL DATA:  Follow-up pelvic fractures. Recent traumatic injury of the pelvis. EXAM: CT PELVIS WITHOUT CONTRAST TECHNIQUE: Multidetector CT imaging of the pelvis was performed following the standard protocol without intravenous contrast. COMPARISON:  07/11/2010 FINDINGS: Bones/Joint/Cartilage Bilateral SI joint diastasis with improved alignment transfixed with a single fully threaded screw transfixing both SI joints and a second solitary left SI joint screw. Comminuted fracture of left posterior ilium. Old healed fracture of the left iliac crest. Comminuted left superior pubic ramus fracture transfixed with a single screw with the distal tip of the screw slightly beyond the articular surface of the left pubic body extending into the symphysis. Displaced left inferior pubic ramus fracture. Comminuted and displaced right inferior pubic ramus fracture. Severely comminuted right superior pubic ramus fracture adjacent to the acetabular junction with mild displacement. No pubic diastasis. Left femoral neck fracture transfixed with 3 cannulated screws in near anatomic alignment. Chronic bilateral  L5 pars interarticularis defects. Displaced fracture of the left L4 transverse process. Nondisplaced fracture of L5 spinous process. Ligaments Ligaments are suboptimally evaluated by CT. Muscles and Tendons No intramuscular fluid collection. Soft tissue Suprapubic catheter in the bladder. Postsurgical changes along the  lower anterior abdominal wall. Degloving injury again noted along the left upper thigh and pelvis with postsurgical changes. Hemorrhagic fluid is seen along the left iliopsoas muscle. Small amount of fluid is seen superficial to the posterior aspect of the oblique musculature inferiorly with hyperdense fluid intermixed with the low-density fluid consistent with small amounts of hemorrhage. Small amount of fluid is seen superficial to the paraspinal musculature inferiorly with hyperdense fluid intermixed with the low-density fluid consistent with small amounts of hemorrhage. IMPRESSION: 1. Bilateral SI joint diastasis with improved alignment transfixed with a single fully threaded screw transfixing both SI joints and a second solitary left SI joint screw. 2. Comminuted fracture of left posterior ilium. 3. Comminuted left superior pubic ramus fracture transfixed with a single screw with the distal tip of the screw slightly beyond the articular surface of the left pubic body extending into the symphysis. Displaced left inferior pubic ramus fracture. Comminuted and displaced right inferior pubic ramus fracture. Severely comminuted right superior pubic ramus fracture adjacent to the acetabular junction with mild displacement. No pubic diastasis. 4. Left femoral neck fracture transfixed with 3 cannulated screws in near anatomic alignment. 5. Displaced fracture of the left L4 transverse process. 6. Nondisplaced fracture of L5 spinous process. 7. Small amount of acute hemorrhage along the superficial inferior aspect the left oblique muscles and right posterior paraspinal muscles. Electronically Signed   By: Elige KoHetal  Patel   On: 07/13/2018 12:39   Dg Pelvis Comp Min 3v  Result Date: 07/12/2018 CLINICAL DATA:  ORIF pelvic fractures EXAM: JUDET PELVIS - 3+ VIEW COMPARISON:  07/11/2018 CT FINDINGS: A fixation screw across the LEFT SI joint/sacral fracture noted. Fixation screws traversing both SI joints identified. A fixation  screw traversing a LEFT SUPERIOR pubic ramus fracture noted. Three fixation screws traversing a LEFT femoral neck fracture noted, in near-anatomic alignment and position on this single view. RIGHT SUPERIOR pubic ramus and bilateral INFERIOR pubic ramus fractures again noted. IMPRESSION: Internal fixation of pelvic fractures/SI diastasis as described. Electronically Signed   By: Harmon PierJeffrey  Hu M.D.   On: 07/12/2018 20:04   Dg Pelvis Comp Min 3v  Result Date: 07/12/2018 CLINICAL DATA:  Pelvic fracture. EXAM: OPERATIVE LEFT HIP (WITH PELVIS IF PERFORMED)  VIEWS TECHNIQUE: Fluoroscopic spot image(s) were submitted for interpretation post-operatively. COMPARISON:  None. FINDINGS: Intraoperative spot films demonstrate open reduction internal fixation of pelvic and sacral fractures, as well as cannulated screw placement across the femoral neck. Pigtail drainage catheter is present. IMPRESSION: As above. Electronically Signed   By: Elsie StainJohn T Curnes M.D.   On: 07/12/2018 17:01   Dg Chest Port 1 View  Result Date: 07/13/2018 CLINICAL DATA:  Pelvic trauma. EXAM: PORTABLE CHEST 1 VIEW COMPARISON:  07/12/2018.  CT 07/11/2018. FINDINGS: Endotracheal tube and NG tube in stable position. Heart size normal. Bilateral pulmonary infiltrates/edema noted on today's exam. Small left pleural effusion can not be excluded. Left costophrenic angle incompletely imaged. No pneumothorax. Left rib fractures best identified by prior CT. IMPRESSION: 1.  Endotracheal tube and NG tube in stable position. 2. Bilateral pulmonary infiltrates/edema noted on today's exam. Small left pleural effusion can not be excluded. No pneumothorax. 3.  Left rib fractures best identified by prior CT. Electronically Signed   By: Maisie Fushomas  Register   On: 07/13/2018 06:41   Dg C-arm 1-60 Min  Result Date: 07/12/2018 CLINICAL DATA:  Pelvic fracture. EXAM: OPERATIVE LEFT HIP (WITH PELVIS IF PERFORMED)  VIEWS TECHNIQUE: Fluoroscopic spot image(s) were submitted for  interpretation post-operatively. COMPARISON:  None. FINDINGS: Intraoperative spot films demonstrate open reduction internal fixation of pelvic and sacral fractures, as well as cannulated screw placement across the femoral neck. Pigtail drainage catheter is present. IMPRESSION: As above. Electronically Signed   By: Elsie Stain M.D.   On: 07/12/2018 17:01   Dg Hip Operative Unilat W Or W/o Pelvis Left  Result Date: 07/12/2018 CLINICAL DATA:  Pelvic fracture. EXAM: OPERATIVE LEFT HIP (WITH PELVIS IF PERFORMED)  VIEWS TECHNIQUE: Fluoroscopic spot image(s) were submitted for interpretation post-operatively. COMPARISON:  None. FINDINGS: Intraoperative spot films demonstrate open reduction internal fixation of pelvic and sacral fractures, as well as cannulated screw placement across the femoral neck. Pigtail drainage catheter is present. IMPRESSION: As above. Electronically Signed   By: Elsie Stain M.D.   On: 07/12/2018 17:01   Dg Hip Unilat W Or W/o Pelvis 2-3 Views Left  Result Date: 07/12/2018 CLINICAL DATA:  LEFT hip pinning. EXAM: DG HIP (WITH OR WITHOUT PELVIS) 2-3V LEFT COMPARISON:  Prior studies FINDINGS: Three screws traversing a LEFT femoral neck fracture is noted. The fracture is in near-anatomic alignment and position. Other pelvic fractures are identified with screw fixation. IMPRESSION: Screw fixation of LEFT femoral neck fracture, in near-anatomic alignment and position. Electronically Signed   By: Harmon Pier M.D.   On: 07/12/2018 20:07    Assessment/Plan:    Severe pelvic fracture with degloving of scrotum/thigh.  Currently stable.  He has suprapubic tube in for possible urethral disruption.    We will plan wound evaluation/possible scrotal closure, retrograde urethrogram, possible cystoscopy for possible catheter placement tomorrow.  We will coordinate with trauma/general surgery   LOS: 4 days   Chelsea Aus 07/14/2018, 2:23 PM

## 2018-07-14 NOTE — Progress Notes (Signed)
Wound vac on left thigh starting to leak more and more around the dressing, multiple ABD pads placed around and on top to reinforce dressing until pt can be taken back to the OR.
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
## 2018-07-15 ENCOUNTER — Inpatient Hospital Stay (HOSPITAL_COMMUNITY): Payer: No Typology Code available for payment source | Admitting: Certified Registered Nurse Anesthetist

## 2018-07-15 ENCOUNTER — Inpatient Hospital Stay (HOSPITAL_COMMUNITY): Payer: No Typology Code available for payment source

## 2018-07-15 ENCOUNTER — Encounter (HOSPITAL_COMMUNITY)
Admission: EM | Disposition: A | Discharge status: Nursing Home (Includes ICF) | Payer: Self-pay | Source: Home / Self Care

## 2018-07-15 HISTORY — PX: SCROTAL EXPLORATION: SHX2386

## 2018-07-15 HISTORY — PX: CYSTOSCOPY W/ URETERAL STENT PLACEMENT: SHX1429

## 2018-07-15 HISTORY — PX: LAPAROTOMY: SHX154

## 2018-07-15 LAB — CBC
HCT: 24.7 % — ABNORMAL LOW (ref 39.0–52.0)
Hemoglobin: 7.5 g/dL — ABNORMAL LOW (ref 13.0–17.0)
MCH: 28.6 pg (ref 26.0–34.0)
MCHC: 30.4 g/dL (ref 30.0–36.0)
MCV: 94.3 fL (ref 80.0–100.0)
Platelets: 112 10*3/uL — ABNORMAL LOW (ref 150–400)
RBC: 2.62 MIL/uL — ABNORMAL LOW (ref 4.22–5.81)
RDW: 17.3 % — ABNORMAL HIGH (ref 11.5–15.5)
WBC: 6.7 10*3/uL (ref 4.0–10.5)
nRBC: 0.3 % — ABNORMAL HIGH (ref 0.0–0.2)

## 2018-07-15 LAB — CBC WITH DIFFERENTIAL/PLATELET
Abs Immature Granulocytes: 0.26 10*3/uL — ABNORMAL HIGH (ref 0.00–0.07)
Basophils Absolute: 0 10*3/uL (ref 0.0–0.1)
Basophils Relative: 0 %
Eosinophils Absolute: 0.3 10*3/uL (ref 0.0–0.5)
Eosinophils Relative: 5 %
HCT: 24.3 % — ABNORMAL LOW (ref 39.0–52.0)
Hemoglobin: 7.5 g/dL — ABNORMAL LOW (ref 13.0–17.0)
Immature Granulocytes: 4 %
Lymphocytes Relative: 10 %
Lymphs Abs: 0.8 10*3/uL (ref 0.7–4.0)
MCH: 29 pg (ref 26.0–34.0)
MCHC: 30.9 g/dL (ref 30.0–36.0)
MCV: 93.8 fL (ref 80.0–100.0)
Monocytes Absolute: 0.6 10*3/uL (ref 0.1–1.0)
Monocytes Relative: 8 %
Neutro Abs: 5.3 10*3/uL (ref 1.7–7.7)
Neutrophils Relative %: 73 %
Platelets: 100 10*3/uL — ABNORMAL LOW (ref 150–400)
RBC: 2.59 MIL/uL — ABNORMAL LOW (ref 4.22–5.81)
RDW: 17.2 % — ABNORMAL HIGH (ref 11.5–15.5)
WBC: 7.3 10*3/uL (ref 4.0–10.5)
nRBC: 0.3 % — ABNORMAL HIGH (ref 0.0–0.2)

## 2018-07-15 LAB — GLUCOSE, CAPILLARY
Glucose-Capillary: 113 mg/dL — ABNORMAL HIGH (ref 70–99)
Glucose-Capillary: 110 mg/dL — ABNORMAL HIGH (ref 70–99)
Glucose-Capillary: 109 mg/dL — ABNORMAL HIGH (ref 70–99)
Glucose-Capillary: 114 mg/dL — ABNORMAL HIGH (ref 70–99)
Glucose-Capillary: 150 mg/dL — ABNORMAL HIGH (ref 70–99)
Glucose-Capillary: 120 mg/dL — ABNORMAL HIGH (ref 70–99)

## 2018-07-15 LAB — BASIC METABOLIC PANEL
Anion gap: 6 (ref 5–15)
BUN: 20 mg/dL (ref 6–20)
CO2: 23 mmol/L (ref 22–32)
Calcium: 7.5 mg/dL — ABNORMAL LOW (ref 8.9–10.3)
Chloride: 117 mmol/L — ABNORMAL HIGH (ref 98–111)
Creatinine, Ser: 0.68 mg/dL (ref 0.61–1.24)
GFR calc Af Amer: >60 mL/min (ref >60)
GFR calc non Af Amer: >60 mL/min (ref >60)
Glucose, Bld: 149 mg/dL — ABNORMAL HIGH (ref 70–99)
Potassium: 3.7 mmol/L (ref 3.5–5.1)
Sodium: 146 mmol/L — ABNORMAL HIGH (ref 135–145)

## 2018-07-15 LAB — PREPARE RBC (CROSSMATCH)

## 2018-07-15 SURGERY — EXPLORATION, SCROTUM
Anesthesia: General | Site: Abdomen

## 2018-07-15 MED ORDER — ALBUMIN HUMAN 5 % IV SOLN
INTRAVENOUS | Status: DC | PRN
  Administered 2018-07-15: 10:00:00 via INTRAVENOUS

## 2018-07-15 MED ORDER — ROCURONIUM BROMIDE 50 MG/5ML IV SOSY
PREFILLED_SYRINGE | INTRAVENOUS | Status: AC
  Filled 2018-07-15: qty 10

## 2018-07-15 MED ORDER — LACTATED RINGERS IV SOLN
INTRAVENOUS | Status: DC | PRN
  Administered 2018-07-15: 09:00:00 via INTRAVENOUS

## 2018-07-15 MED ORDER — ROCURONIUM BROMIDE 10 MG/ML (PF) SYRINGE
PREFILLED_SYRINGE | INTRAVENOUS | Status: DC | PRN
  Administered 2018-07-15 (×3): 50 mg via INTRAVENOUS

## 2018-07-15 MED ORDER — IOPAMIDOL (ISOVUE-300) INJECTION 61%
INTRAVENOUS | Status: DC | PRN
  Administered 2018-07-15: 10 mL via URETHRAL

## 2018-07-15 MED ORDER — MIDAZOLAM HCL 2 MG/2ML IJ SOLN
INTRAMUSCULAR | Status: DC | PRN
  Administered 2018-07-15: 2 mg via INTRAVENOUS

## 2018-07-15 MED ORDER — FENTANYL CITRATE (PF) 250 MCG/5ML IJ SOLN
INTRAMUSCULAR | Status: AC
  Filled 2018-07-15: qty 5

## 2018-07-15 MED ORDER — STERILE WATER FOR IRRIGATION IR SOLN
Status: DC | PRN
  Administered 2018-07-15: 1000 mL

## 2018-07-15 MED ORDER — PROPOFOL 1000 MG/100ML IV EMUL
INTRAVENOUS | Status: AC
  Filled 2018-07-15: qty 100

## 2018-07-15 MED ORDER — SODIUM CHLORIDE 0.9% IV SOLUTION: Freq: Once | INTRAVENOUS | Status: DC

## 2018-07-15 MED ORDER — MIDAZOLAM HCL 2 MG/2ML IJ SOLN
INTRAMUSCULAR | Status: AC
  Filled 2018-07-15: qty 2

## 2018-07-15 MED ORDER — IOPAMIDOL (ISOVUE-300) INJECTION 61%
INTRAVENOUS | Status: AC
  Filled 2018-07-15: qty 50

## 2018-07-15 MED ORDER — FENTANYL CITRATE (PF) 250 MCG/5ML IJ SOLN
INTRAMUSCULAR | Status: DC | PRN
  Administered 2018-07-15 (×5): 50 ug via INTRAVENOUS

## 2018-07-15 MED ORDER — 0.9 % SODIUM CHLORIDE (POUR BTL) OPTIME
TOPICAL | Status: DC | PRN
  Administered 2018-07-15: 3000 mL

## 2018-07-15 MED ORDER — ROCURONIUM BROMIDE 50 MG/5ML IV SOSY
PREFILLED_SYRINGE | INTRAVENOUS | Status: AC
  Filled 2018-07-15: qty 5

## 2018-07-15 SURGICAL SUPPLY — 46 items
APL SKNCLS STERI-STRIP NONHPOA (GAUZE/BANDAGES/DRESSINGS) ×8
BAG DRAIN URO-CYSTO SKYTR STRL (DRAIN) ×3 IMPLANT
BENZOIN TINCTURE PRP APPL 2/3 (GAUZE/BANDAGES/DRESSINGS) ×12 IMPLANT
BLADE SURG 15 STRL LF DISP TIS (BLADE) ×2 IMPLANT
BLADE SURG 15 STRL SS (BLADE) ×2
CANISTER WOUND CARE 500ML ATS (WOUND CARE) ×3 IMPLANT
CATH URET 5FR 28IN OPEN ENDED (CATHETERS) ×3 IMPLANT
DRAPE C-ARM 42X72 X-RAY (DRAPES) ×3 IMPLANT
DRAPE CAMERA CLOSED 9X96 (DRAPES) ×3 IMPLANT
DRAPE INCISE IOBAN 66X45 STRL (DRAPES) ×3 IMPLANT
DRAPE LAPAROTOMY T 102X78X121 (DRAPES) ×3 IMPLANT
DRAPE UTILITY XL STRL (DRAPES) ×6 IMPLANT
DRAPE WARM FLUID 44X44 (DRAPE) ×3 IMPLANT
DRSG OPSITE POSTOP 4X10 (GAUZE/BANDAGES/DRESSINGS) ×3 IMPLANT
DRSG PAD ABDOMINAL 8X10 ST (GAUZE/BANDAGES/DRESSINGS) ×6 IMPLANT
DRSG VAC ATS MED SENSATRAC (GAUZE/BANDAGES/DRESSINGS) ×3 IMPLANT
ELECT BLADE 6.5 EXT (BLADE) ×3 IMPLANT
ELECT REM PT RETURN 9FT ADLT (ELECTROSURGICAL) ×3
ELECTRODE REM PT RTRN 9FT ADLT (ELECTROSURGICAL) ×2 IMPLANT
GAUZE SPONGE 4X4 12PLY STRL (GAUZE/BANDAGES/DRESSINGS) ×3 IMPLANT
GLOVE BIO SURGEON STRL SZ8 (GLOVE) ×3 IMPLANT
GLOVE SURG ORTHO 8.0 STRL STRW (GLOVE) ×3 IMPLANT
GOWN STRL REUS W/ TWL LRG LVL3 (GOWN DISPOSABLE) ×2 IMPLANT
GOWN STRL REUS W/ TWL XL LVL3 (GOWN DISPOSABLE) ×4 IMPLANT
GOWN STRL REUS W/TWL LRG LVL3 (GOWN DISPOSABLE) ×1
GOWN STRL REUS W/TWL XL LVL3 (GOWN DISPOSABLE) ×2
GUIDEWIRE STR DUAL SENSOR (WIRE) ×3 IMPLANT
KIT BASIN OR (CUSTOM PROCEDURE TRAY) ×3 IMPLANT
KIT TURNOVER KIT B (KITS) ×6 IMPLANT
MANIFOLD NEPTUNE II (INSTRUMENTS) ×3 IMPLANT
NS IRRIG 1000ML POUR BTL (IV SOLUTION) ×9 IMPLANT
PACK CYSTO (CUSTOM PROCEDURE TRAY) ×3 IMPLANT
PACK GENERAL/GYN (CUSTOM PROCEDURE TRAY) ×3 IMPLANT
PAD ARMBOARD 7.5X6 YLW CONV (MISCELLANEOUS) ×9 IMPLANT
PENCIL SMOKE EVACUATOR (MISCELLANEOUS) ×3 IMPLANT
SOL PREP POV-IOD 4OZ 10% (MISCELLANEOUS) ×6 IMPLANT
STAPLER VISISTAT 35W (STAPLE) ×3 IMPLANT
SURGILUBE 2OZ TUBE FLIPTOP (MISCELLANEOUS) ×3 IMPLANT
SUT PROLENE 2 0 SH 30 (SUTURE) ×3 IMPLANT
SUT SILK 3 0 TIES 10X30 (SUTURE) ×3 IMPLANT
SYR 10ML LL (SYRINGE) ×3 IMPLANT
SYRINGE CONTROL L 12CC (SYRINGE) ×3 IMPLANT
SYRINGE TOOMEY DISP (SYRINGE) ×3 IMPLANT
TOWEL OR 17X26 10 PK STRL BLUE (TOWEL DISPOSABLE) ×6 IMPLANT
UNDERPAD 30X30 (UNDERPADS AND DIAPERS) ×6 IMPLANT
WATER STERILE IRR 1000ML POUR (IV SOLUTION) ×3 IMPLANT

## 2018-07-15 NOTE — Transfer of Care (Signed)
Immediate Anesthesia Transfer of Care Note  Patient: Christopher [name]  Procedure(s) Performed: SCROTUM DEBRIDEMENT (N/A ) RETROGRADE URETHROGRAM (N/A ) WOUND EXPLORATION; CLOSURE OF ABDOMEN (N/A Abdomen)  Patient Location: NICU  Anesthesia Type:General  Level of Consciousness: Patient remains intubated per anesthesia plan  Airway & Oxygen Therapy: Patient remains intubated per anesthesia plan and Patient placed on Ventilator (see vital sign flow sheet for setting)  Post-op Assessment: Report given to RN and Post -op Vital signs reviewed and stable  Post vital signs: Reviewed and stable  Last Vitals:  Vitals Value Taken Time  BP 135/86 07/15/2018 10:55 AM  Temp 35.7 C 07/15/2018 10:56 AM  Pulse 110 07/15/2018 10:56 AM  Resp 18 07/15/2018 10:56 AM  SpO2 94 % 07/15/2018 10:56 AM  Vitals shown include unvalidated device data.  Last Pain:  Vitals:   07/15/18 0800  TempSrc: Esophageal  PainSc:          Complications: No apparent anesthesia complications

## 2018-07-15 NOTE — Anesthesia Preprocedure Evaluation (Signed)
Anesthesia Evaluation  Patient identified by MRN, date of birth, ID band Patient unresponsive    Reviewed: Allergy & Precautions, H&P , NPO status , Patient's Chart, lab work & pertinent test results, Unable to perform ROS - Chart review only  Airway Mallampati: Intubated       Dental   Pulmonary  intubated   breath sounds clear to auscultation       Cardiovascular negative cardio ROS   Rhythm:regular Rate:Normal     Neuro/Psych    GI/Hepatic   Endo/Other    Renal/GU    Scrotal injury    Musculoskeletal Pelvis fx Left femur fx Degloving injury left leg   Abdominal   Peds  Hematology  (+) anemia ,   Anesthesia Other Findings   Reproductive/Obstetrics                             Anesthesia Physical Anesthesia Plan  ASA: IV  Anesthesia Plan: General   Post-op Pain Management:    Induction: Intravenous  PONV Risk Score and Plan: 2 and Ondansetron and Treatment may vary due to age or medical condition  Airway Management Planned: Oral ETT  Additional Equipment: Arterial line  Intra-op Plan:   Post-operative Plan: Post-operative intubation/ventilation  Informed Consent: I have reviewed the patients History and Physical, chart, labs and discussed the procedure including the risks, benefits and alternatives for the proposed anesthesia with the patient or authorized representative who has indicated his/her understanding and acceptance.       Plan Discussed with: Anesthesiologist, CRNA and Surgeon  Anesthesia Plan Comments:         Anesthesia Quick Evaluation

## 2018-07-15 NOTE — Op Note (Signed)
07/15/2018  10:21 AM  PATIENT:  Christopher [name]  53 y.o. male  PRE-OPERATIVE DIAGNOSIS:  Open abdomen  POST-OPERATIVE DIAGNOSIS:  No intra-abdominal injuries seen  PROCEDURE:  Procedure(s): EXPLORATORY LAPAROTOMY CLOSURE OF ABDOMEN  SURGEON:  Donna BernardBurke Thomposn, MD  ASSISTANTS: Willow OraSteve Dahlstedt, MD  ANESTHESIA:   general  EBL:  Total I/O In: 250 [IV Piggyback:250] Out: 20 [Blood:20]  BLOOD ADMINISTERED:none  DRAINS: none   SPECIMEN:  No Specimen  DISPOSITION OF SPECIMEN:  N/A  COUNTS:  YES  DICTATION: .Dragon Dictation Findings: No intra-abdominal injuries noted, able to close abdomen without difficulty  Procedure in detail: [name] is brought back for planned exploratory laparotomy.  He has an open abdomen.  Concurrently, Dr. Retta Dionesahlstedt will address his urogenital injuries.  He was brought directly from the intensive care unit to the operating room on the ventilator.  He is on IV antibiotics.  Informed consent was obtained.  General anesthesia was administered.  His outer VAC drapes were removed.  His abdomen was prepped and draped in sterile fashion.  We did a timeout procedure.  I irrigated his inner VAC drape and it was removed easily from the abdomen.  The abdomen was explored.  No injuries were noted.  The retroperitoneal edema seemed significantly improved.  The abdomen was irrigated I then was able to close it with running #1 looped PDS in the fascia.  Peak airway pressures remained at 19.  Subcutaneous tissues were irrigated and the skin was closed with staples.  I then assisted Dr. Retta Dionesahlstedt with his portion of the procedure and at the and we replaced the VAC sponge area on his left groin wound and testicles.  It was covered with [place] with expected leak noted.  Plan to continue wall suction for that.  All counts were correct for my portion of the procedure.  He remained in the OR with Dr. Retta Dionesahlstedt.  No complications. PATIENT DISPOSITION:  ICU - intubated and critically  ill.   Delay start of Pharmacological VTE agent (>24hrs) due to surgical blood loss or risk of bleeding:  yes  Violeta GelinasBurke Homer Pfeifer, MD, MPH, FACS Pager: [phone]  2/2/202010:21 AM
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
## 2018-07-15 NOTE — Anesthesia Postprocedure Evaluation (Signed)
Anesthesia Post Note  Patient: Christopher [name]  Procedure(s) Performed: SCROTUM DEBRIDEMENT (N/A ) RETROGRADE URETHROGRAM (N/A ) WOUND EXPLORATION; CLOSURE OF ABDOMEN (N/A Abdomen)     Patient location during evaluation: SICU Anesthesia Type: General Level of consciousness: sedated Pain management: pain level controlled Vital Signs Assessment: post-procedure vital signs reviewed and stable Respiratory status: patient remains intubated per anesthesia plan Cardiovascular status: stable Postop Assessment: no apparent nausea or vomiting Anesthetic complications: no    Last Vitals:  Vitals:   07/15/18 1200 07/15/18 1300  BP: 133/89 (!) 144/83  Pulse: 100 (!) 109  Resp: 18 17  Temp: (!) 36 C 36.7 C  SpO2: 96% 97%    Last Pain:  Vitals:   07/15/18 1100  TempSrc: Esophageal  PainSc:                  Wilene Pharo S

## 2018-07-15 NOTE — Progress Notes (Addendum)
No output noted from new wound vac. Upon repositioning patient, it was noted that under pad was completely saturated and wounds from scrotum and back side were draining a large amount of serosanguinous fluid. Approx 3 in air leak is noted around left side of groin where   Wound vac covering is meeting ace wrap of left leg. Spoke with MD B Janee Morn, made aware of pt's current status. MD Janee Morn instructs to reinforce areas with abd dressings, etc. + changed soiled pads hourly as needed.  Janee Morn reports he will re-assess pt on 2/3. Will continue to monitor and reinforce areas as needed. Will maintain wall suction until further notice.
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
## 2018-07-16 ENCOUNTER — Encounter (HOSPITAL_COMMUNITY)
Admission: EM | Disposition: A | Discharge status: Nursing Home (Includes ICF) | Payer: Self-pay | Source: Home / Self Care

## 2018-07-16 ENCOUNTER — Encounter (HOSPITAL_COMMUNITY): Payer: Self-pay | Admitting: General Surgery

## 2018-07-16 ENCOUNTER — Inpatient Hospital Stay (HOSPITAL_COMMUNITY): Payer: No Typology Code available for payment source

## 2018-07-16 LAB — CBC
HCT: 26.3 % — ABNORMAL LOW (ref 39.0–52.0)
Hemoglobin: 8 g/dL — ABNORMAL LOW (ref 13.0–17.0)
MCH: 28.9 pg (ref 26.0–34.0)
MCHC: 30.4 g/dL (ref 30.0–36.0)
MCV: 94.9 fL (ref 80.0–100.0)
Platelets: 145 10*3/uL — ABNORMAL LOW (ref 150–400)
RBC: 2.77 MIL/uL — ABNORMAL LOW (ref 4.22–5.81)
RDW: 17.6 % — ABNORMAL HIGH (ref 11.5–15.5)
WBC: 10.7 10*3/uL — ABNORMAL HIGH (ref 4.0–10.5)
nRBC: 0.5 % — ABNORMAL HIGH (ref 0.0–0.2)

## 2018-07-16 LAB — GLUCOSE, CAPILLARY
Glucose-Capillary: 134 mg/dL — ABNORMAL HIGH (ref 70–99)
Glucose-Capillary: 179 mg/dL — ABNORMAL HIGH (ref 70–99)
Glucose-Capillary: 160 mg/dL — ABNORMAL HIGH (ref 70–99)
Glucose-Capillary: 159 mg/dL — ABNORMAL HIGH (ref 70–99)
Glucose-Capillary: 143 mg/dL — ABNORMAL HIGH (ref 70–99)
Glucose-Capillary: 121 mg/dL — ABNORMAL HIGH (ref 70–99)

## 2018-07-16 LAB — BASIC METABOLIC PANEL
Anion gap: 7 (ref 5–15)
BUN: 25 mg/dL — ABNORMAL HIGH (ref 6–20)
CO2: 22 mmol/L (ref 22–32)
Calcium: 7.5 mg/dL — ABNORMAL LOW (ref 8.9–10.3)
Chloride: 119 mmol/L — ABNORMAL HIGH (ref 98–111)
Creatinine, Ser: 0.78 mg/dL (ref 0.61–1.24)
GFR calc Af Amer: >60 mL/min (ref >60)
GFR calc non Af Amer: >60 mL/min (ref >60)
Glucose, Bld: 168 mg/dL — ABNORMAL HIGH (ref 70–99)
Potassium: 4.2 mmol/L (ref 3.5–5.1)
Sodium: 148 mmol/L — ABNORMAL HIGH (ref 135–145)

## 2018-07-16 SURGERY — LAPAROTOMY, EXPLORATORY
Anesthesia: General

## 2018-07-16 MED ORDER — FUROSEMIDE 10 MG/ML IJ SOLN
20.0000 mg | Freq: Once | INTRAMUSCULAR | Status: AC
  Administered 2018-07-16: 20 mg via INTRAVENOUS
  Filled 2018-07-16: qty 2
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
## 2018-07-16 NOTE — Progress Notes (Signed)
Bed pads saturated with draining fluid changed.

## 2018-07-16 NOTE — Progress Notes (Signed)
Wound vac on patient's L leg and scrotal area has not been suctioning out any fluid at all during my entire shift, but I was told in report that the MD was aware of this.  While changing out pads underneath the patient it was noted that the pt is starting to have skin slough off on the back side, as well as accumulating fluid in his lower back to the point that a pocket of fluid is developing .  Pt was cleaned up and was greatly turned onto one side in order to keep pressure off of his lower back.  Dr. Janee Morn notified and made aware, no new orders at this time except to keep reinforcing and turning to keep pressure off of backside.

## 2018-07-16 NOTE — Progress Notes (Signed)
MD Haddix from ortho came by to see patient. He is aware of wet to dry dressing changes.

## 2018-07-16 NOTE — Progress Notes (Signed)
Assisted MD T. Cornett in removing wound vac and left leg ace wrap. Will start to maintain wet to dry dressings + xeroform and abd pads. MD Cornett aware of significant pooling of serosanguinous fluid from pt's groin/rectum upon turning. Dressings in place.

## 2018-07-17 ENCOUNTER — Encounter (HOSPITAL_COMMUNITY): Payer: Self-pay

## 2018-07-17 LAB — GLUCOSE, CAPILLARY
Glucose-Capillary: 146 mg/dL — ABNORMAL HIGH (ref 70–99)
Glucose-Capillary: 159 mg/dL — ABNORMAL HIGH (ref 70–99)
Glucose-Capillary: 145 mg/dL — ABNORMAL HIGH (ref 70–99)
Glucose-Capillary: 156 mg/dL — ABNORMAL HIGH (ref 70–99)
Glucose-Capillary: 153 mg/dL — ABNORMAL HIGH (ref 70–99)
Glucose-Capillary: 174 mg/dL — ABNORMAL HIGH (ref 70–99)

## 2018-07-17 LAB — BASIC METABOLIC PANEL
Anion gap: 9 (ref 5–15)
BUN: 25 mg/dL — ABNORMAL HIGH (ref 6–20)
CO2: 22 mmol/L (ref 22–32)
Calcium: 7.7 mg/dL — ABNORMAL LOW (ref 8.9–10.3)
Chloride: 117 mmol/L — ABNORMAL HIGH (ref 98–111)
Creatinine, Ser: 0.76 mg/dL (ref 0.61–1.24)
GFR calc Af Amer: >60 mL/min (ref >60)
GFR calc non Af Amer: >60 mL/min (ref >60)
Glucose, Bld: 190 mg/dL — ABNORMAL HIGH (ref 70–99)
Potassium: 4.3 mmol/L (ref 3.5–5.1)
Sodium: 148 mmol/L — ABNORMAL HIGH (ref 135–145)

## 2018-07-17 LAB — CBC
HCT: 25.2 % — ABNORMAL LOW (ref 39.0–52.0)
Hemoglobin: 7.9 g/dL — ABNORMAL LOW (ref 13.0–17.0)
MCH: 29.4 pg (ref 26.0–34.0)
MCHC: 31.3 g/dL (ref 30.0–36.0)
MCV: 93.7 fL (ref 80.0–100.0)
Platelets: 213 10*3/uL (ref 150–400)
RBC: 2.69 MIL/uL — ABNORMAL LOW (ref 4.22–5.81)
RDW: 17.9 % — ABNORMAL HIGH (ref 11.5–15.5)
WBC: 12 10*3/uL — ABNORMAL HIGH (ref 4.0–10.5)
nRBC: 0.3 % — ABNORMAL HIGH (ref 0.0–0.2)

## 2018-07-17 LAB — TRIGLYCERIDES: Triglycerides: 203 mg/dL — ABNORMAL HIGH (ref <150)

## 2018-07-17 MED ORDER — ARTIFICIAL TEARS OPHTHALMIC OINT
TOPICAL_OINTMENT | OPHTHALMIC | Status: DC | PRN
  Administered 2018-07-17 – 2018-07-21 (×5): via OPHTHALMIC
  Administered 2018-07-26: 1 via OPHTHALMIC
  Administered 2018-07-28 – 2018-07-31 (×3): via OPHTHALMIC
  Administered 2018-08-03: 1 via OPHTHALMIC
  Administered 2018-08-12: 11:00:00 via OPHTHALMIC
  Filled 2018-07-17 (×3): qty 3.5

## 2018-07-17 MED ORDER — PIVOT 1.5 CAL PO LIQD
1000.0000 mL | ORAL | Status: DC
  Administered 2018-07-17 – 2018-07-19 (×3): 1000 mL

## 2018-07-17 MED ORDER — FUROSEMIDE 10 MG/ML IJ SOLN
40.0000 mg | Freq: Once | INTRAMUSCULAR | Status: AC
  Administered 2018-07-17: 40 mg via INTRAVENOUS
  Filled 2018-07-17: qty 4

## 2018-07-17 MED ORDER — PIPERACILLIN-TAZOBACTAM 3.375 G IVPB
3.3750 g | Freq: Three times a day (TID) | INTRAVENOUS | Status: DC
  Administered 2018-07-17 – 2018-07-27 (×29): 3.375 g via INTRAVENOUS
  Filled 2018-07-17 (×27): qty 50

## 2018-07-17 MED ORDER — PRO-STAT SUGAR FREE PO LIQD
60.0000 mL | Freq: Two times a day (BID) | ORAL | Status: DC
  Administered 2018-07-17 – 2018-07-19 (×3): 60 mL
  Filled 2018-07-17 (×3): qty 60

## 2018-07-17 MED ORDER — FREE WATER
200.0000 mL | Freq: Three times a day (TID) | Status: DC
  Administered 2018-07-17 – 2018-07-29 (×31): 200 mL
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
## 2018-07-17 NOTE — Social Work (Signed)
CSW acknowledging consult, for [employer], pt RNCM will assist with paperwork and disposition.  Octavio Graves, MSW, [facility] Clinical Social Work [phone]
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
## 2018-07-17 NOTE — Progress Notes (Signed)
Wound note: W to D gauze dressings to Lt hip, scrotum, and open area near rectum. Xeroform to purple/black areas on Lt thigh and buttocks. Dr Janee Morn aware of these wounds, plan for OR tomorrow.
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
## 2018-07-18 ENCOUNTER — Encounter (HOSPITAL_COMMUNITY): Payer: Self-pay | Admitting: Certified Registered Nurse Anesthetist

## 2018-07-18 ENCOUNTER — Encounter (HOSPITAL_COMMUNITY)
Admission: EM | Disposition: A | Discharge status: Nursing Home (Includes ICF) | Payer: Self-pay | Source: Home / Self Care

## 2018-07-18 ENCOUNTER — Inpatient Hospital Stay (HOSPITAL_COMMUNITY): Payer: No Typology Code available for payment source | Admitting: Certified Registered Nurse Anesthetist

## 2018-07-18 ENCOUNTER — Inpatient Hospital Stay (HOSPITAL_COMMUNITY): Payer: No Typology Code available for payment source

## 2018-07-18 DIAGNOSIS — I96 Gangrene, not elsewhere classified: Secondary | ICD-10-CM

## 2018-07-18 HISTORY — PX: INCISION AND DRAINAGE OF WOUND: SHX1803

## 2018-07-18 LAB — CBC
HCT: 24.1 % — ABNORMAL LOW (ref 39.0–52.0)
HCT: 26.9 % — ABNORMAL LOW (ref 39.0–52.0)
Hemoglobin: 7.3 g/dL — ABNORMAL LOW (ref 13.0–17.0)
Hemoglobin: 8.2 g/dL — ABNORMAL LOW (ref 13.0–17.0)
MCH: 28.6 pg (ref 26.0–34.0)
MCH: 29 pg (ref 26.0–34.0)
MCHC: 30.3 g/dL (ref 30.0–36.0)
MCHC: 30.5 g/dL (ref 30.0–36.0)
MCV: 94.5 fL (ref 80.0–100.0)
MCV: 95.1 fL (ref 80.0–100.0)
Platelets: 215 10*3/uL (ref 150–400)
Platelets: 245 10*3/uL (ref 150–400)
RBC: 2.55 MIL/uL — ABNORMAL LOW (ref 4.22–5.81)
RBC: 2.83 MIL/uL — ABNORMAL LOW (ref 4.22–5.81)
RDW: 18 % — ABNORMAL HIGH (ref 11.5–15.5)
RDW: 18.2 % — ABNORMAL HIGH (ref 11.5–15.5)
WBC: 11.2 10*3/uL — ABNORMAL HIGH (ref 4.0–10.5)
WBC: 15.5 10*3/uL — ABNORMAL HIGH (ref 4.0–10.5)
nRBC: 0.2 % (ref 0.0–0.2)
nRBC: 0.1 % (ref 0.0–0.2)

## 2018-07-18 LAB — PREPARE RBC (CROSSMATCH)

## 2018-07-18 LAB — BASIC METABOLIC PANEL
Anion gap: 4 — ABNORMAL LOW (ref 5–15)
BUN: 30 mg/dL — ABNORMAL HIGH (ref 6–20)
CO2: 23 mmol/L (ref 22–32)
Calcium: 7.6 mg/dL — ABNORMAL LOW (ref 8.9–10.3)
Chloride: 120 mmol/L — ABNORMAL HIGH (ref 98–111)
Creatinine, Ser: 0.76 mg/dL (ref 0.61–1.24)
GFR calc Af Amer: >60 mL/min (ref >60)
GFR calc non Af Amer: >60 mL/min (ref >60)
Glucose, Bld: 180 mg/dL — ABNORMAL HIGH (ref 70–99)
Potassium: 4.3 mmol/L (ref 3.5–5.1)
Sodium: 147 mmol/L — ABNORMAL HIGH (ref 135–145)

## 2018-07-18 LAB — GLUCOSE, CAPILLARY
Glucose-Capillary: 164 mg/dL — ABNORMAL HIGH (ref 70–99)
Glucose-Capillary: 139 mg/dL — ABNORMAL HIGH (ref 70–99)
Glucose-Capillary: 133 mg/dL — ABNORMAL HIGH (ref 70–99)
Glucose-Capillary: 122 mg/dL — ABNORMAL HIGH (ref 70–99)
Glucose-Capillary: 150 mg/dL — ABNORMAL HIGH (ref 70–99)

## 2018-07-18 SURGERY — IRRIGATION AND DEBRIDEMENT WOUND
Anesthesia: General | Site: Abdomen

## 2018-07-18 MED ORDER — MIDAZOLAM HCL 2 MG/2ML IJ SOLN
INTRAMUSCULAR | Status: AC
  Filled 2018-07-18: qty 2

## 2018-07-18 MED ORDER — FENTANYL CITRATE (PF) 250 MCG/5ML IJ SOLN
INTRAMUSCULAR | Status: DC | PRN
  Administered 2018-07-18 (×2): 50 ug via INTRAVENOUS
  Administered 2018-07-18: 250 ug via INTRAVENOUS
  Administered 2018-07-18: 100 ug via INTRAVENOUS
  Administered 2018-07-18: 50 ug via INTRAVENOUS

## 2018-07-18 MED ORDER — SODIUM CHLORIDE 0.9 % IV SOLN
INTRAVENOUS | Status: DC | PRN
  Administered 2018-07-18: 500 mL

## 2018-07-18 MED ORDER — METOPROLOL TARTRATE 5 MG/5ML IV SOLN
5.0000 mg | Freq: Once | INTRAVENOUS | Status: AC
  Administered 2018-07-18: 5 mg via INTRAVENOUS
  Filled 2018-07-18: qty 5

## 2018-07-18 MED ORDER — MIDAZOLAM HCL 2 MG/2ML IJ SOLN
INTRAMUSCULAR | Status: DC | PRN
  Administered 2018-07-18: 2 mg via INTRAVENOUS

## 2018-07-18 MED ORDER — ENOXAPARIN SODIUM 40 MG/0.4ML ~~LOC~~ SOLN
40.0000 mg | SUBCUTANEOUS | Status: DC
  Administered 2018-07-19 – 2018-10-16 (×83): 40 mg via SUBCUTANEOUS
  Filled 2018-07-18 (×86): qty 0.4

## 2018-07-18 MED ORDER — ROCURONIUM BROMIDE 50 MG/5ML IV SOSY
PREFILLED_SYRINGE | INTRAVENOUS | Status: DC | PRN
  Administered 2018-07-18 (×4): 50 mg via INTRAVENOUS

## 2018-07-18 MED ORDER — LACTATED RINGERS IV SOLN
INTRAVENOUS | Status: DC | PRN
  Administered 2018-07-18: 15:00:00 via INTRAVENOUS

## 2018-07-18 MED ORDER — FENTANYL CITRATE (PF) 250 MCG/5ML IJ SOLN
INTRAMUSCULAR | Status: AC
  Filled 2018-07-18: qty 5

## 2018-07-18 MED ORDER — SODIUM CHLORIDE 0.9 % IV SOLN
INTRAVENOUS | Status: AC
  Filled 2018-07-18: qty 500000

## 2018-07-18 MED ORDER — METOPROLOL TARTRATE 5 MG/5ML IV SOLN
5.0000 mg | INTRAVENOUS | Status: DC | PRN
  Administered 2018-07-19 – 2018-07-26 (×7): 5 mg via INTRAVENOUS
  Filled 2018-07-18 (×8): qty 5

## 2018-07-18 MED ORDER — 0.9 % SODIUM CHLORIDE (POUR BTL) OPTIME
TOPICAL | Status: DC | PRN
  Administered 2018-07-18: 1000 mL

## 2018-07-18 MED ORDER — ROCURONIUM BROMIDE 50 MG/5ML IV SOSY
PREFILLED_SYRINGE | INTRAVENOUS | Status: AC
  Filled 2018-07-18: qty 5

## 2018-07-18 MED ORDER — SODIUM CHLORIDE 0.9% IV SOLUTION: Freq: Once | INTRAVENOUS | Status: DC

## 2018-07-18 MED ORDER — ROCURONIUM BROMIDE 50 MG/5ML IV SOSY
PREFILLED_SYRINGE | INTRAVENOUS | Status: AC
  Filled 2018-07-18: qty 10

## 2018-07-18 MED ORDER — LIDOCAINE-EPINEPHRINE 1 %-1:100000 IJ SOLN
INTRAMUSCULAR | Status: AC
  Filled 2018-07-18: qty 1

## 2018-07-18 MED ORDER — SODIUM CHLORIDE 0.9 % IV SOLN
INTRAVENOUS | Status: DC | PRN
  Administered 2018-08-25: 12:00:00 via INTRA_ARTERIAL

## 2018-07-18 SURGICAL SUPPLY — 42 items
BAG DECANTER FOR FLEXI CONT (MISCELLANEOUS) ×2 IMPLANT
BNDG GAUZE ELAST 4 BULKY (GAUZE/BANDAGES/DRESSINGS) ×4 IMPLANT
CANISTER SUCT 3000ML PPV (MISCELLANEOUS) ×2 IMPLANT
COVER SURGICAL LIGHT HANDLE (MISCELLANEOUS) IMPLANT
DRAPE HALF SHEET 40X57 (DRAPES) ×6 IMPLANT
DRAPE IMP U-DRAPE 54X76 (DRAPES) IMPLANT
DRAPE INCISE IOBAN 66X45 STRL (DRAPES) ×2 IMPLANT
DRAPE LAPAROSCOPIC ABDOMINAL (DRAPES) IMPLANT
DRAPE LAPAROTOMY 100X72 PEDS (DRAPES) IMPLANT
DRSG CUTIMED SORBACT 7X9 (GAUZE/BANDAGES/DRESSINGS) ×6 IMPLANT
ELECT CAUTERY BLADE 6.4 (BLADE) ×2 IMPLANT
ELECT REM PT RETURN 9FT ADLT (ELECTROSURGICAL) ×2
ELECTRODE REM PT RTRN 9FT ADLT (ELECTROSURGICAL) ×1 IMPLANT
GLOVE BIO SURGEON STRL SZ 6.5 (GLOVE) ×6 IMPLANT
GLOVE BIOGEL PI IND STRL 7.0 (GLOVE) ×3 IMPLANT
GLOVE BIOGEL PI IND STRL 7.5 (GLOVE) ×4 IMPLANT
GLOVE BIOGEL PI INDICATOR 7.0 (GLOVE) ×3
GLOVE BIOGEL PI INDICATOR 7.5 (GLOVE) ×4
GLOVE SS N UNI LF 6.5 STRL (GLOVE) ×4 IMPLANT
GLOVE SS N UNI LF 7.0 STRL (GLOVE) ×4 IMPLANT
GOWN STRL REUS W/ TWL LRG LVL3 (GOWN DISPOSABLE) ×5 IMPLANT
GOWN STRL REUS W/TWL LRG LVL3 (GOWN DISPOSABLE) ×10
HANDPIECE INTERPULSE COAX TIP (DISPOSABLE) ×2
IV NS IRRIG 3000ML ARTHROMATIC (IV SOLUTION) ×2 IMPLANT
KIT BASIN OR (CUSTOM PROCEDURE TRAY) ×4 IMPLANT
KIT TURNOVER KIT B (KITS) ×2 IMPLANT
MATRIX WOUND 3-LAYER 10X15 (Tissue) ×6 IMPLANT
MICROMATRIX 1000MG (Tissue) ×10 IMPLANT
NEEDLE HYPO 25GX1X1/2 BEV (NEEDLE) ×2 IMPLANT
NS IRRIG 1000ML POUR BTL (IV SOLUTION) ×2 IMPLANT
PACK GENERAL/GYN (CUSTOM PROCEDURE TRAY) ×4 IMPLANT
PACK UNIVERSAL I (CUSTOM PROCEDURE TRAY) ×4 IMPLANT
PAD ABD 8X10 STRL (GAUZE/BANDAGES/DRESSINGS) ×10 IMPLANT
PAD ARMBOARD 7.5X6 YLW CONV (MISCELLANEOUS) ×8 IMPLANT
SET HNDPC FAN SPRY TIP SCT (DISPOSABLE) ×1 IMPLANT
SOLUTION PARTIC MCRMTRX 1000MG (Tissue) ×5 IMPLANT
SURGILUBE 2OZ TUBE FLIPTOP (MISCELLANEOUS) ×6 IMPLANT
SUT VIC AB 5-0 PS2 18 (SUTURE) ×18 IMPLANT
SYR CONTROL 10ML LL (SYRINGE) ×2 IMPLANT
TAPE CLOTH SURG 4X10 WHT LF (GAUZE/BANDAGES/DRESSINGS) ×4 IMPLANT
TOWEL OR 17X26 10 PK STRL BLUE (TOWEL DISPOSABLE) ×2 IMPLANT
UNDERPAD 30X30 (UNDERPADS AND DIAPERS) ×4 IMPLANT

## 2018-07-18 NOTE — Progress Notes (Signed)
Spoke with Laretta Bolster, [facility] Case Manager for patient.  Provided clinical update, and faxed clinicals to her at [phone].    Will continue to follow/assist with coordination of WC case as needed.    Quintella Baton, RN, BSN  Trauma/Neuro ICU Case Manager [phone]

## 2018-07-18 NOTE — Progress Notes (Signed)
Upon arrival for shift RN paged on call Trauma MD regarding pt.'s tachy cardia.  Orders given to give PRBC as scheduled and metoprolol if need.  Md aware of pt's temp upon administration of blood product.

## 2018-07-18 NOTE — Progress Notes (Signed)
Dr Ulice Bold to room. Pt's family updated, consent for surgery obtained.
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
## 2018-07-18 NOTE — Transfer of Care (Signed)
Immediate Anesthesia Transfer of Care Note  Patient: Christopher [name]  Procedure(s) Performed: Debridement of left leg, buttocks and scrotal wound with placement of acell and Flexiseal (N/A Abdomen)  Patient Location: PACU  Anesthesia Type:General  Level of Consciousness: Patient remains intubated per anesthesia plan  Airway & Oxygen Therapy: Patient remains intubated per anesthesia plan and Patient placed on Ventilator (see vital sign flow sheet for setting)  Post-op Assessment: Report given to RN and Post -op Vital signs reviewed and stable  Post vital signs: Reviewed and stable  Last Vitals:  Vitals Value Taken Time  BP 136/82 07/18/2018  6:00 PM  Temp    Pulse 121 07/18/2018  5:52 PM  Resp 18 07/18/2018  6:01 PM  SpO2 94 % 07/18/2018  5:52 PM  Vitals shown include unvalidated device data.  Last Pain:  Vitals:   07/18/18 0400  TempSrc: Esophageal  PainSc:          Complications: No apparent anesthesia complications
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
## 2018-07-18 NOTE — Progress Notes (Signed)
Paged on call Trauma MD regarding patient's sudden rise in temp 101.7-103.5 and increased tachycardia 150's. Orders given for one time metoprolol 5mg  dose.  Orders given to give scheduled tylenol, ice packs.  Will continue to observe and if temp increases more will obtain cooling blanket.

## 2018-07-18 NOTE — Progress Notes (Signed)
[facility] given to [facility], Scientist, clinical (histocompatibility and immunogenetics)CRNA. Pt wheeled to OR.

## 2018-07-18 NOTE — Progress Notes (Signed)
Patient ID: Christopher [name], male   DOB: Jan 22, 1966, 53 y.o.   MRN: [id number] I met with his fiancee and sister at the bedside and updated them on his progress. Dr. Marla Roe will be speaking with them prior to the OR.  Georganna Skeans, MD, MPH, FACS Trauma: [phone] General Surgery: [phone]
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
## 2018-07-18 NOTE — Anesthesia Procedure Notes (Signed)
Date/Time: 07/18/2018 3:00 PM Performed by: Modena Morrow, CRNA Pre-anesthesia Checklist: Patient identified, Emergency Drugs available, Suction available, Patient being monitored and Timeout performed Tube secured with: Tape
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
## 2018-07-19 ENCOUNTER — Inpatient Hospital Stay (HOSPITAL_COMMUNITY): Payer: No Typology Code available for payment source

## 2018-07-19 ENCOUNTER — Inpatient Hospital Stay: Payer: Self-pay

## 2018-07-19 LAB — BASIC METABOLIC PANEL
Anion gap: 11 (ref 5–15)
BUN: 31 mg/dL — ABNORMAL HIGH (ref 6–20)
CO2: 19 mmol/L — ABNORMAL LOW (ref 22–32)
Calcium: 7.8 mg/dL — ABNORMAL LOW (ref 8.9–10.3)
Chloride: 116 mmol/L — ABNORMAL HIGH (ref 98–111)
Creatinine, Ser: 0.69 mg/dL (ref 0.61–1.24)
GFR calc Af Amer: >60 mL/min (ref >60)
GFR calc non Af Amer: >60 mL/min (ref >60)
Glucose, Bld: 238 mg/dL — ABNORMAL HIGH (ref 70–99)
Potassium: 4.9 mmol/L (ref 3.5–5.1)
Sodium: 146 mmol/L — ABNORMAL HIGH (ref 135–145)

## 2018-07-19 LAB — TYPE AND SCREEN
ABO/RH(D): O POS
Antibody Screen: NEGATIVE
Unit division: 0
Unit division: 0
Unit division: 0

## 2018-07-19 LAB — BPAM RBC
Blood Product Expiration Date: 202002232359
Blood Product Expiration Date: 202003042359
Blood Product Expiration Date: 202003042359
ISSUE DATE / TIME: 202002051623
ISSUE DATE / TIME: 202002051934
ISSUE DATE / TIME: 202002060433
Unit Type and Rh: 5100
Unit Type and Rh: 5100
Unit Type and Rh: 5100

## 2018-07-19 LAB — CBC
HCT: 28.4 % — ABNORMAL LOW (ref 39.0–52.0)
Hemoglobin: 8.5 g/dL — ABNORMAL LOW (ref 13.0–17.0)
MCH: 27.6 pg (ref 26.0–34.0)
MCHC: 29.9 g/dL — ABNORMAL LOW (ref 30.0–36.0)
MCV: 92.2 fL (ref 80.0–100.0)
Platelets: 223 10*3/uL (ref 150–400)
RBC: 3.08 MIL/uL — ABNORMAL LOW (ref 4.22–5.81)
RDW: 20.1 % — ABNORMAL HIGH (ref 11.5–15.5)
WBC: 12 10*3/uL — ABNORMAL HIGH (ref 4.0–10.5)
nRBC: 0 % (ref 0.0–0.2)

## 2018-07-19 LAB — GLUCOSE, CAPILLARY
Glucose-Capillary: 205 mg/dL — ABNORMAL HIGH (ref 70–99)
Glucose-Capillary: 250 mg/dL — ABNORMAL HIGH (ref 70–99)
Glucose-Capillary: 183 mg/dL — ABNORMAL HIGH (ref 70–99)
Glucose-Capillary: 179 mg/dL — ABNORMAL HIGH (ref 70–99)
Glucose-Capillary: 156 mg/dL — ABNORMAL HIGH (ref 70–99)
Glucose-Capillary: 155 mg/dL — ABNORMAL HIGH (ref 70–99)

## 2018-07-19 LAB — HEMOGLOBIN AND HEMATOCRIT, BLOOD
HCT: 30.2 % — ABNORMAL LOW (ref 39.0–52.0)
Hemoglobin: 9.1 g/dL — ABNORMAL LOW (ref 13.0–17.0)

## 2018-07-19 MED ORDER — SODIUM CHLORIDE 0.45 % IV SOLN
INTRAVENOUS | Status: DC
  Administered 2018-07-19 – 2018-07-22 (×4): via INTRAVENOUS
  Administered 2018-07-23: 1 mL via INTRAVENOUS
  Administered 2018-07-24 – 2018-09-03 (×18): via INTRAVENOUS

## 2018-07-19 MED ORDER — SODIUM CHLORIDE 0.9% FLUSH
10.0000 mL | Freq: Two times a day (BID) | INTRAVENOUS | Status: DC
  Administered 2018-07-19 – 2018-07-27 (×13): 10 mL
  Administered 2018-07-28: 20 mL
  Administered 2018-07-28 – 2018-07-30 (×4): 10 mL
  Administered 2018-07-30: 30 mL
  Administered 2018-07-31 (×2): 10 mL
  Administered 2018-08-01 – 2018-08-02 (×2): 20 mL
  Administered 2018-08-03: 10 mL
  Administered 2018-08-03: 20 mL
  Administered 2018-08-04 – 2018-08-06 (×5): 10 mL
  Administered 2018-08-07: 20 mL
  Administered 2018-08-07 – 2018-08-12 (×9): 10 mL

## 2018-07-19 MED ORDER — SODIUM CHLORIDE 0.9% FLUSH: 10.0000 mL | INTRAVENOUS | Status: DC | PRN

## 2018-07-19 MED ORDER — JUVEN PO PACK
1.0000 | PACK | Freq: Two times a day (BID) | ORAL | Status: DC
  Administered 2018-07-19 – 2018-10-16 (×163): 1
  Filled 2018-07-19 (×183): qty 1

## 2018-07-19 MED ORDER — CHLORHEXIDINE GLUCONATE CLOTH 2 % EX PADS
6.0000 | MEDICATED_PAD | Freq: Every day | CUTANEOUS | Status: DC
  Administered 2018-07-20 – 2018-08-04 (×11): 6 via TOPICAL

## 2018-07-19 MED ORDER — PRO-STAT SUGAR FREE PO LIQD
60.0000 mL | Freq: Three times a day (TID) | ORAL | Status: DC
  Administered 2018-07-19 – 2018-07-26 (×20): 60 mL
  Filled 2018-07-19 (×19): qty 60

## 2018-07-19 MED ORDER — INSULIN ASPART 100 UNIT/ML ~~LOC~~ SOLN
0.0000 [IU] | SUBCUTANEOUS | Status: DC
  Administered 2018-07-19 (×2): 3 [IU] via SUBCUTANEOUS
  Administered 2018-07-19: 5 [IU] via SUBCUTANEOUS
  Administered 2018-07-19 – 2018-07-20 (×3): 3 [IU] via SUBCUTANEOUS
  Administered 2018-07-20 – 2018-07-21 (×6): 2 [IU] via SUBCUTANEOUS
  Administered 2018-07-21: 3 [IU] via SUBCUTANEOUS
  Administered 2018-07-21 – 2018-07-22 (×3): 2 [IU] via SUBCUTANEOUS
  Administered 2018-07-22: 3 [IU] via SUBCUTANEOUS
  Administered 2018-07-22 (×3): 2 [IU] via SUBCUTANEOUS
  Administered 2018-07-23 (×3): 3 [IU] via SUBCUTANEOUS
  Administered 2018-07-23: 2 [IU] via SUBCUTANEOUS
  Administered 2018-07-24 (×2): 3 [IU] via SUBCUTANEOUS
  Administered 2018-07-24 (×2): 5 [IU] via SUBCUTANEOUS
  Administered 2018-07-24 (×3): 3 [IU] via SUBCUTANEOUS
  Administered 2018-07-25: 2 [IU] via SUBCUTANEOUS
  Administered 2018-07-25: 3 [IU] via SUBCUTANEOUS
  Administered 2018-07-25: 2 [IU] via SUBCUTANEOUS
  Administered 2018-07-26 (×4): 3 [IU] via SUBCUTANEOUS
  Administered 2018-07-26: 2 [IU] via SUBCUTANEOUS
  Administered 2018-07-26 – 2018-07-27 (×2): 3 [IU] via SUBCUTANEOUS
  Administered 2018-07-27 (×2): 5 [IU] via SUBCUTANEOUS
  Administered 2018-07-27: 2 [IU] via SUBCUTANEOUS
  Administered 2018-07-27: 5 [IU] via SUBCUTANEOUS
  Administered 2018-07-28 – 2018-07-29 (×8): 3 [IU] via SUBCUTANEOUS
  Administered 2018-07-29 – 2018-07-30 (×5): 2 [IU] via SUBCUTANEOUS
  Administered 2018-07-30 (×3): 3 [IU] via SUBCUTANEOUS
  Administered 2018-07-30: 5 [IU] via SUBCUTANEOUS
  Administered 2018-07-31: 3 [IU] via SUBCUTANEOUS
  Administered 2018-07-31 (×2): 2 [IU] via SUBCUTANEOUS
  Administered 2018-07-31: 3 [IU] via SUBCUTANEOUS
  Administered 2018-08-01: 2 [IU] via SUBCUTANEOUS
  Administered 2018-08-01: 3 [IU] via SUBCUTANEOUS
  Administered 2018-08-01 – 2018-08-04 (×9): 2 [IU] via SUBCUTANEOUS
  Administered 2018-08-04: 3 [IU] via SUBCUTANEOUS
  Administered 2018-08-04: 2 [IU] via SUBCUTANEOUS
  Administered 2018-08-04: 3 [IU] via SUBCUTANEOUS
  Administered 2018-08-04: 2 [IU] via SUBCUTANEOUS
  Administered 2018-08-05: 3 [IU] via SUBCUTANEOUS
  Administered 2018-08-05: 2 [IU] via SUBCUTANEOUS
  Administered 2018-08-05: 3 [IU] via SUBCUTANEOUS
  Administered 2018-08-06 – 2018-08-07 (×3): 2 [IU] via SUBCUTANEOUS
  Administered 2018-08-07: 3 [IU] via SUBCUTANEOUS
  Administered 2018-08-07 (×3): 2 [IU] via SUBCUTANEOUS
  Administered 2018-08-07: 3 [IU] via SUBCUTANEOUS
  Administered 2018-08-08: 2 [IU] via SUBCUTANEOUS
  Administered 2018-08-08: 3 [IU] via SUBCUTANEOUS
  Administered 2018-08-08 – 2018-08-12 (×16): 2 [IU] via SUBCUTANEOUS
  Administered 2018-08-12: 3 [IU] via SUBCUTANEOUS
  Administered 2018-08-12 – 2018-08-16 (×12): 2 [IU] via SUBCUTANEOUS
  Administered 2018-08-16: 3 [IU] via SUBCUTANEOUS
  Administered 2018-08-17 (×2): 2 [IU] via SUBCUTANEOUS
  Administered 2018-08-17: 3 [IU] via SUBCUTANEOUS
  Administered 2018-08-17 – 2018-09-02 (×31): 2 [IU] via SUBCUTANEOUS
  Administered 2018-09-02: 3 [IU] via SUBCUTANEOUS
  Administered 2018-09-03 – 2018-09-04 (×6): 2 [IU] via SUBCUTANEOUS
  Administered 2018-09-05: 1 [IU] via SUBCUTANEOUS
  Administered 2018-09-05: 3 [IU] via SUBCUTANEOUS
  Administered 2018-09-05 – 2018-09-06 (×5): 2 [IU] via SUBCUTANEOUS
  Administered 2018-09-07: 3 [IU] via SUBCUTANEOUS
  Administered 2018-09-07 – 2018-09-12 (×16): 2 [IU] via SUBCUTANEOUS
  Administered 2018-09-12: 3 [IU] via SUBCUTANEOUS
  Administered 2018-09-12 – 2018-09-13 (×5): 2 [IU] via SUBCUTANEOUS
  Administered 2018-09-13: 3 [IU] via SUBCUTANEOUS
  Administered 2018-09-13: 2 [IU] via SUBCUTANEOUS
  Administered 2018-09-13: 3 [IU] via SUBCUTANEOUS
  Administered 2018-09-13: 2 [IU] via SUBCUTANEOUS
  Administered 2018-09-14: 3 [IU] via SUBCUTANEOUS
  Administered 2018-09-14 (×2): 2 [IU] via SUBCUTANEOUS
  Administered 2018-09-14: 3 [IU] via SUBCUTANEOUS
  Administered 2018-09-14 (×2): 2 [IU] via SUBCUTANEOUS
  Administered 2018-09-15 (×3): 3 [IU] via SUBCUTANEOUS
  Administered 2018-09-15 (×2): 2 [IU] via SUBCUTANEOUS
  Administered 2018-09-16 (×2): 3 [IU] via SUBCUTANEOUS
  Administered 2018-09-16 (×3): 2 [IU] via SUBCUTANEOUS
  Administered 2018-09-16: 3 [IU] via SUBCUTANEOUS
  Administered 2018-09-17: 2 [IU] via SUBCUTANEOUS
  Administered 2018-09-17 (×2): 3 [IU] via SUBCUTANEOUS
  Administered 2018-09-17: 2 [IU] via SUBCUTANEOUS
  Administered 2018-09-17 – 2018-09-18 (×9): 3 [IU] via SUBCUTANEOUS
  Administered 2018-09-19: 2 [IU] via SUBCUTANEOUS
  Administered 2018-09-20: 3 [IU] via SUBCUTANEOUS
  Administered 2018-09-20: 2 [IU] via SUBCUTANEOUS
  Administered 2018-09-20 (×4): 3 [IU] via SUBCUTANEOUS
  Administered 2018-09-20: 5 [IU] via SUBCUTANEOUS
  Administered 2018-09-21 (×2): 2 [IU] via SUBCUTANEOUS
  Administered 2018-09-21: 5 [IU] via SUBCUTANEOUS
  Administered 2018-09-21 (×2): 3 [IU] via SUBCUTANEOUS
  Administered 2018-09-21: 2 [IU] via SUBCUTANEOUS
  Administered 2018-09-22 (×2): 3 [IU] via SUBCUTANEOUS
  Administered 2018-09-22 (×2): 2 [IU] via SUBCUTANEOUS
  Administered 2018-09-23: 3 [IU] via SUBCUTANEOUS
  Administered 2018-09-23: 20:00:00 2 [IU] via SUBCUTANEOUS
  Administered 2018-09-23: 3 [IU] via SUBCUTANEOUS
  Administered 2018-09-23: 2 [IU] via SUBCUTANEOUS
  Administered 2018-09-23 – 2018-09-24 (×2): 3 [IU] via SUBCUTANEOUS
  Administered 2018-09-24: 5 [IU] via SUBCUTANEOUS
  Administered 2018-09-24: 2 [IU] via SUBCUTANEOUS
  Administered 2018-09-24: 3 [IU] via SUBCUTANEOUS
  Administered 2018-09-24: 05:00:00 2 [IU] via SUBCUTANEOUS
  Administered 2018-09-24 – 2018-09-25 (×4): 3 [IU] via SUBCUTANEOUS
  Administered 2018-09-25: 2 [IU] via SUBCUTANEOUS
  Administered 2018-09-25 – 2018-09-26 (×4): 3 [IU] via SUBCUTANEOUS
  Administered 2018-09-26: 2 [IU] via SUBCUTANEOUS
  Administered 2018-09-26 (×2): 3 [IU] via SUBCUTANEOUS
  Administered 2018-09-27: 2 [IU] via SUBCUTANEOUS
  Administered 2018-09-27: 3 [IU] via SUBCUTANEOUS
  Administered 2018-09-27 (×2): 2 [IU] via SUBCUTANEOUS
  Administered 2018-09-27: 3 [IU] via SUBCUTANEOUS
  Administered 2018-09-27: 2 [IU] via SUBCUTANEOUS
  Administered 2018-09-28: 3 [IU] via SUBCUTANEOUS
  Administered 2018-09-28: 2 [IU] via SUBCUTANEOUS
  Administered 2018-09-28: 3 [IU] via SUBCUTANEOUS
  Administered 2018-09-28 (×2): 2 [IU] via SUBCUTANEOUS
  Administered 2018-09-28 – 2018-09-30 (×7): 3 [IU] via SUBCUTANEOUS
  Administered 2018-09-30 – 2018-10-01 (×4): 2 [IU] via SUBCUTANEOUS
  Administered 2018-10-01: 3 [IU] via SUBCUTANEOUS
  Administered 2018-10-01: 2 [IU] via SUBCUTANEOUS
  Administered 2018-10-01: 3 [IU] via SUBCUTANEOUS
  Administered 2018-10-01 – 2018-10-02 (×4): 2 [IU] via SUBCUTANEOUS
  Administered 2018-10-03 – 2018-10-04 (×4): 3 [IU] via SUBCUTANEOUS
  Administered 2018-10-04: 5 [IU] via SUBCUTANEOUS
  Administered 2018-10-04 – 2018-10-05 (×3): 3 [IU] via SUBCUTANEOUS
  Administered 2018-10-05 – 2018-10-06 (×4): 2 [IU] via SUBCUTANEOUS
  Administered 2018-10-06: 3 [IU] via SUBCUTANEOUS
  Administered 2018-10-07: 2 [IU] via SUBCUTANEOUS
  Administered 2018-10-07 (×2): 3 [IU] via SUBCUTANEOUS
  Administered 2018-10-07 – 2018-10-10 (×14): 2 [IU] via SUBCUTANEOUS
  Administered 2018-10-10: 3 [IU] via SUBCUTANEOUS
  Administered 2018-10-11 (×3): 2 [IU] via SUBCUTANEOUS
  Administered 2018-10-11 (×2): 3 [IU] via SUBCUTANEOUS
  Administered 2018-10-12: 2 [IU] via SUBCUTANEOUS
  Administered 2018-10-12: 0 [IU] via SUBCUTANEOUS
  Administered 2018-10-12 – 2018-10-16 (×13): 2 [IU] via SUBCUTANEOUS
  Administered 2018-10-16: 3 [IU] via SUBCUTANEOUS
  Administered 2018-10-16: 2 [IU] via SUBCUTANEOUS

## 2018-07-19 NOTE — Progress Notes (Signed)
Patient ID: Christopher [name], male   DOB: 1966/04/17, 53 y.o.   MRN: [id number] Buttock wound examined. Muscle appears viable for the most part. The odor is gone. Still serosanguinous drainage. Continue dressing changes. Plan further debridement as needed.   Violeta Gelinas, MD, MPH, FACS Trauma: [phone] General Surgery: [phone]

## 2018-07-19 NOTE — Progress Notes (Signed)
Peripherally Inserted Central Catheter/Midline Placement  The IV Nurse has discussed with the patient and/or persons authorized to consent for the patient, the purpose of this procedure and the potential benefits and risks involved with this procedure.  The benefits include less needle sticks, lab draws from the catheter, and the patient may be discharged home with the catheter. Risks include, but not limited to, infection, bleeding, blood clot (thrombus formation), and puncture of an artery; nerve damage and irregular heartbeat and possibility to perform a PICC exchange if needed/ordered by physician.  Alternatives to this procedure were also discussed.  Bard Power PICC patient education guide, fact sheet on infection prevention and patient information card has been provided to patient /or left at bedside.    PICC/Midline Placement Documentation  PICC Triple Lumen 07/19/18 Right Basilic 38 cm 0 cm (Active)  Indication for Insertion or Continuance of Line Vasoactive infusions 07/19/2018 11:09 AM  Exposed Catheter (cm) 0 cm 07/19/2018 11:09 AM  Site Assessment Clean;Dry;Intact 07/19/2018 11:09 AM  Lumen #1 Status Flushed;Blood return noted;Saline locked 07/19/2018 11:09 AM  Lumen #2 Status Flushed;Blood return noted;Saline locked 07/19/2018 11:09 AM  Lumen #3 Status Flushed;Blood return noted;Saline locked 07/19/2018 11:09 AM  Dressing Type Transparent;Securing device 07/19/2018 11:09 AM  Dressing Status Clean;Dry;Intact;Antimicrobial disc in place 07/19/2018 11:09 AM  Dressing Change Due 07/26/18 07/19/2018 11:09 AM       Christopher [name] 07/19/2018, 11:13 AM

## 2018-07-19 NOTE — Anesthesia Procedure Notes (Signed)
Arterial Line Insertion Start/End2/10/2018 5:00 PM, 07/18/2018 5:10 PM Performed by: Elmer Picker, MD, anesthesiologist  Patient location: OR. Preanesthetic checklist: patient identified, IV checked, risks and benefits discussed, surgical consent, monitors and equipment checked, pre-op evaluation and timeout performed radial was placed Catheter size: 20 G Hand hygiene performed  Allen's test indicative of satisfactory collateral circulation Attempts: 1 Procedure performed without using ultrasound guided technique. Following insertion, dressing applied and Biopatch. Post procedure assessment: normal and unchanged  Patient tolerated the procedure well with no immediate complications.
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
## 2018-07-19 NOTE — Anesthesia Postprocedure Evaluation (Signed)
Anesthesia Post Note  Patient: Nicholas Resendes Schepers  Procedure(s) Performed: Debridement of left leg, buttocks and scrotal wound with placement of acell and Flexiseal (N/A Abdomen)     Patient location during evaluation: ICU Anesthesia Type: General Level of consciousness: patient remains intubated per anesthesia plan Pain management: pain level controlled Vital Signs Assessment: post-procedure vital signs reviewed and stable Respiratory status: patient remains intubated per anesthesia plan Cardiovascular status: blood pressure returned to baseline Postop Assessment: no apparent nausea or vomiting Anesthetic complications: no    Last Vitals:  Vitals:   07/19/18 1000 07/19/18 1100  BP: 132/78 133/69  Pulse: (!) 116 (!) 112  Resp: 16 15  Temp: (!) 38 C (!) 38.1 C  SpO2: 95% 96%    Last Pain:  Vitals:   07/19/18 0000  TempSrc: Esophageal  PainSc:                  Winslow Verrill L Wyona Neils
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
## 2018-07-20 ENCOUNTER — Encounter (HOSPITAL_COMMUNITY): Payer: Self-pay | Admitting: Plastic Surgery

## 2018-07-20 ENCOUNTER — Inpatient Hospital Stay (HOSPITAL_COMMUNITY): Payer: No Typology Code available for payment source

## 2018-07-20 LAB — BASIC METABOLIC PANEL
Anion gap: 9 (ref 5–15)
BUN: 32 mg/dL — ABNORMAL HIGH (ref 6–20)
CO2: 20 mmol/L — ABNORMAL LOW (ref 22–32)
Calcium: 7.7 mg/dL — ABNORMAL LOW (ref 8.9–10.3)
Chloride: 119 mmol/L — ABNORMAL HIGH (ref 98–111)
Creatinine, Ser: 0.66 mg/dL (ref 0.61–1.24)
GFR calc Af Amer: >60 mL/min (ref >60)
GFR calc non Af Amer: >60 mL/min (ref >60)
Glucose, Bld: 149 mg/dL — ABNORMAL HIGH (ref 70–99)
Potassium: 3.8 mmol/L (ref 3.5–5.1)
Sodium: 148 mmol/L — ABNORMAL HIGH (ref 135–145)

## 2018-07-20 LAB — CBC
HCT: 25.4 % — ABNORMAL LOW (ref 39.0–52.0)
Hemoglobin: 7.6 g/dL — ABNORMAL LOW (ref 13.0–17.0)
MCH: 27.6 pg (ref 26.0–34.0)
MCHC: 29.9 g/dL — ABNORMAL LOW (ref 30.0–36.0)
MCV: 92.4 fL (ref 80.0–100.0)
Platelets: 203 10*3/uL (ref 150–400)
RBC: 2.75 MIL/uL — ABNORMAL LOW (ref 4.22–5.81)
RDW: 19.9 % — ABNORMAL HIGH (ref 11.5–15.5)
WBC: 11.6 10*3/uL — ABNORMAL HIGH (ref 4.0–10.5)
nRBC: 0.3 % — ABNORMAL HIGH (ref 0.0–0.2)

## 2018-07-20 LAB — GLUCOSE, CAPILLARY
Glucose-Capillary: 125 mg/dL — ABNORMAL HIGH (ref 70–99)
Glucose-Capillary: 134 mg/dL — ABNORMAL HIGH (ref 70–99)
Glucose-Capillary: 134 mg/dL — ABNORMAL HIGH (ref 70–99)
Glucose-Capillary: 136 mg/dL — ABNORMAL HIGH (ref 70–99)
Glucose-Capillary: 145 mg/dL — ABNORMAL HIGH (ref 70–99)
Glucose-Capillary: 153 mg/dL — ABNORMAL HIGH (ref 70–99)

## 2018-07-20 LAB — TRIGLYCERIDES: Triglycerides: 559 mg/dL — ABNORMAL HIGH (ref <150)

## 2018-07-20 MED ORDER — PIVOT 1.5 CAL PO LIQD
1000.0000 mL | ORAL | Status: DC
  Administered 2018-07-20 – 2018-07-24 (×3): 1000 mL

## 2018-07-20 MED ORDER — POLYETHYLENE GLYCOL 3350 17 G PO PACK
17.0000 g | PACK | Freq: Every day | ORAL | Status: DC
  Administered 2018-07-20: 17 g via ORAL
  Filled 2018-07-20: qty 1
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
## 2018-07-21 LAB — GLUCOSE, CAPILLARY
Glucose-Capillary: 153 mg/dL — ABNORMAL HIGH (ref 70–99)
Glucose-Capillary: 98 mg/dL (ref 70–99)
Glucose-Capillary: 134 mg/dL — ABNORMAL HIGH (ref 70–99)
Glucose-Capillary: 129 mg/dL — ABNORMAL HIGH (ref 70–99)
Glucose-Capillary: 134 mg/dL — ABNORMAL HIGH (ref 70–99)

## 2018-07-21 LAB — TRIGLYCERIDES: Triglycerides: 396 mg/dL — ABNORMAL HIGH (ref <150)
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
## 2018-07-21 NOTE — Progress Notes (Signed)
RT note: patient placed on CPAP/PSV of 8/5 at 0740.  Currently tolerating well.  Will continue to monitor.  

## 2018-07-21 NOTE — Progress Notes (Signed)
Called Trauma about patients continuous loose stools that immediatly ooze underneath dressings right after being cleaned up.  Verbal order given to discontinue Miralax and rounding team would be by to look at it in a few hours.

## 2018-07-21 NOTE — Progress Notes (Signed)
Pt running fever of 102.6, pt packed with ice and placed on cooling blanket. Dr.Kinsinger made aware, no new orders.
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
## 2018-07-21 NOTE — Progress Notes (Signed)
All dressing changed on both the L leg and lower back d/t the pt having a bowel movement.  Stool was present underneath some of the back dressings and it was cleaned off the wound as best as possible with sterile saline and gauze before being redressed.

## 2018-07-22 ENCOUNTER — Inpatient Hospital Stay (HOSPITAL_COMMUNITY): Payer: No Typology Code available for payment source

## 2018-07-22 LAB — BASIC METABOLIC PANEL
Anion gap: 9 (ref 5–15)
BUN: 36 mg/dL — ABNORMAL HIGH (ref 6–20)
CO2: 22 mmol/L (ref 22–32)
Calcium: 8.4 mg/dL — ABNORMAL LOW (ref 8.9–10.3)
Chloride: 118 mmol/L — ABNORMAL HIGH (ref 98–111)
Creatinine, Ser: 0.66 mg/dL (ref 0.61–1.24)
GFR calc Af Amer: >60 mL/min (ref >60)
GFR calc non Af Amer: >60 mL/min (ref >60)
Glucose, Bld: 181 mg/dL — ABNORMAL HIGH (ref 70–99)
Potassium: 3.8 mmol/L (ref 3.5–5.1)
Sodium: 149 mmol/L — ABNORMAL HIGH (ref 135–145)

## 2018-07-22 LAB — GLUCOSE, CAPILLARY
Glucose-Capillary: 110 mg/dL — ABNORMAL HIGH (ref 70–99)
Glucose-Capillary: 111 mg/dL — ABNORMAL HIGH (ref 70–99)
Glucose-Capillary: 128 mg/dL — ABNORMAL HIGH (ref 70–99)
Glucose-Capillary: 139 mg/dL — ABNORMAL HIGH (ref 70–99)
Glucose-Capillary: 150 mg/dL — ABNORMAL HIGH (ref 70–99)
Glucose-Capillary: 150 mg/dL — ABNORMAL HIGH (ref 70–99)
Glucose-Capillary: 155 mg/dL — ABNORMAL HIGH (ref 70–99)

## 2018-07-22 LAB — CULTURE, BLOOD (ROUTINE X 2)
Culture: NO GROWTH
Culture: NO GROWTH
Special Requests: ADEQUATE
Special Requests: ADEQUATE

## 2018-07-22 LAB — CBC
HCT: 25.7 % — ABNORMAL LOW (ref 39.0–52.0)
Hemoglobin: 7.7 g/dL — ABNORMAL LOW (ref 13.0–17.0)
MCH: 27.8 pg (ref 26.0–34.0)
MCHC: 30 g/dL (ref 30.0–36.0)
MCV: 92.8 fL (ref 80.0–100.0)
Platelets: 289 K/uL (ref 150–400)
RBC: 2.77 MIL/uL — ABNORMAL LOW (ref 4.22–5.81)
RDW: 19.6 % — ABNORMAL HIGH (ref 11.5–15.5)
WBC: 11.9 K/uL — ABNORMAL HIGH (ref 4.0–10.5)
nRBC: 0 % (ref 0.0–0.2)
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
## 2018-07-22 NOTE — Progress Notes (Signed)
RT note: patient placed on CPAP/PSV of 8/5 at 0745.  Currently tolerating well.  Will continue to monitor.

## 2018-07-23 ENCOUNTER — Inpatient Hospital Stay (HOSPITAL_COMMUNITY): Payer: No Typology Code available for payment source | Admitting: Anesthesiology

## 2018-07-23 ENCOUNTER — Encounter (HOSPITAL_COMMUNITY)
Admission: EM | Disposition: A | Discharge status: Nursing Home (Includes ICF) | Payer: Self-pay | Source: Home / Self Care

## 2018-07-23 HISTORY — PX: COLOSTOMY: SHX63

## 2018-07-23 HISTORY — PX: WOUND DEBRIDEMENT: SHX247

## 2018-07-23 LAB — CBC
HCT: 22.8 % — ABNORMAL LOW (ref 39.0–52.0)
HCT: 24.4 % — ABNORMAL LOW (ref 39.0–52.0)
Hemoglobin: 7 g/dL — ABNORMAL LOW (ref 13.0–17.0)
Hemoglobin: 7.4 g/dL — ABNORMAL LOW (ref 13.0–17.0)
MCH: 28.7 pg (ref 26.0–34.0)
MCH: 28.2 pg (ref 26.0–34.0)
MCHC: 30.7 g/dL (ref 30.0–36.0)
MCHC: 30.3 g/dL (ref 30.0–36.0)
MCV: 93.4 fL (ref 80.0–100.0)
MCV: 93.1 fL (ref 80.0–100.0)
Platelets: 340 10*3/uL (ref 150–400)
Platelets: 365 10*3/uL (ref 150–400)
RBC: 2.44 MIL/uL — ABNORMAL LOW (ref 4.22–5.81)
RBC: 2.62 MIL/uL — ABNORMAL LOW (ref 4.22–5.81)
RDW: 19.7 % — ABNORMAL HIGH (ref 11.5–15.5)
RDW: 19.9 % — ABNORMAL HIGH (ref 11.5–15.5)
WBC: 12.2 10*3/uL — ABNORMAL HIGH (ref 4.0–10.5)
WBC: 13.2 10*3/uL — ABNORMAL HIGH (ref 4.0–10.5)
nRBC: 0.2 % (ref 0.0–0.2)
nRBC: 0 % (ref 0.0–0.2)

## 2018-07-23 LAB — GLUCOSE, CAPILLARY
Glucose-Capillary: 123 mg/dL — ABNORMAL HIGH (ref 70–99)
Glucose-Capillary: 99 mg/dL (ref 70–99)
Glucose-Capillary: 163 mg/dL — ABNORMAL HIGH (ref 70–99)
Glucose-Capillary: 173 mg/dL — ABNORMAL HIGH (ref 70–99)
Glucose-Capillary: 194 mg/dL — ABNORMAL HIGH (ref 70–99)
Glucose-Capillary: 190 mg/dL — ABNORMAL HIGH (ref 70–99)

## 2018-07-23 LAB — PREPARE RBC (CROSSMATCH)

## 2018-07-23 LAB — BASIC METABOLIC PANEL
Anion gap: 6 (ref 5–15)
BUN: 41 mg/dL — ABNORMAL HIGH (ref 6–20)
CO2: 23 mmol/L (ref 22–32)
Calcium: 8.2 mg/dL — ABNORMAL LOW (ref 8.9–10.3)
Chloride: 120 mmol/L — ABNORMAL HIGH (ref 98–111)
Creatinine, Ser: 0.76 mg/dL (ref 0.61–1.24)
GFR calc Af Amer: >60 mL/min (ref >60)
GFR calc non Af Amer: >60 mL/min (ref >60)
Glucose, Bld: 139 mg/dL — ABNORMAL HIGH (ref 70–99)
Potassium: 4.1 mmol/L (ref 3.5–5.1)
Sodium: 149 mmol/L — ABNORMAL HIGH (ref 135–145)

## 2018-07-23 LAB — TRIGLYCERIDES: Triglycerides: 285 mg/dL — ABNORMAL HIGH (ref <150)

## 2018-07-23 SURGERY — CREATION, COLOSTOMY
Anesthesia: General | Site: Buttocks

## 2018-07-23 MED ORDER — SODIUM CHLORIDE 0.9% IV SOLUTION
Freq: Once | INTRAVENOUS | Status: AC
  Administered 2018-07-29: 12:00:00 via INTRAVENOUS

## 2018-07-23 MED ORDER — ROCURONIUM BROMIDE 10 MG/ML (PF) SYRINGE
PREFILLED_SYRINGE | INTRAVENOUS | Status: DC | PRN
  Administered 2018-07-23 (×2): 20 mg via INTRAVENOUS
  Administered 2018-07-23 (×2): 50 mg via INTRAVENOUS

## 2018-07-23 MED ORDER — HYDROMORPHONE BOLUS VIA INFUSION: 1.0000 mg | INTRAVENOUS | Status: DC | PRN

## 2018-07-23 MED ORDER — LIDOCAINE 2% (20 MG/ML) 5 ML SYRINGE
INTRAMUSCULAR | Status: AC
  Filled 2018-07-23: qty 10

## 2018-07-23 MED ORDER — HYDROCODONE-ACETAMINOPHEN 7.5-325 MG/15ML PO SOLN
15.0000 mL | ORAL | Status: DC | PRN
  Administered 2018-07-28 – 2018-08-01 (×4): 15 mL
  Filled 2018-07-23 (×5): qty 15

## 2018-07-23 MED ORDER — FENTANYL CITRATE (PF) 250 MCG/5ML IJ SOLN
INTRAMUSCULAR | Status: DC | PRN
  Administered 2018-07-23 (×5): 50 ug via INTRAVENOUS

## 2018-07-23 MED ORDER — MIDAZOLAM HCL 2 MG/2ML IJ SOLN
INTRAMUSCULAR | Status: AC
  Filled 2018-07-23: qty 2

## 2018-07-23 MED ORDER — ROCURONIUM BROMIDE 50 MG/5ML IV SOSY
PREFILLED_SYRINGE | INTRAVENOUS | Status: AC
  Filled 2018-07-23: qty 40

## 2018-07-23 MED ORDER — HYDROMORPHONE HCL 1 MG/ML IJ SOLN
1.0000 mg | INTRAMUSCULAR | Status: DC | PRN
  Administered 2018-07-26 – 2018-07-29 (×11): 2 mg via INTRAVENOUS
  Administered 2018-07-30: 1 mg via INTRAVENOUS
  Administered 2018-07-31 – 2018-08-01 (×5): 2 mg via INTRAVENOUS
  Filled 2018-07-23: qty 1
  Filled 2018-07-23 (×18): qty 2

## 2018-07-23 MED ORDER — LACTATED RINGERS IV SOLN
INTRAVENOUS | Status: DC | PRN
  Administered 2018-07-23: 08:00:00 via INTRAVENOUS

## 2018-07-23 MED ORDER — DEXAMETHASONE SODIUM PHOSPHATE 10 MG/ML IJ SOLN
INTRAMUSCULAR | Status: AC
  Filled 2018-07-23: qty 2

## 2018-07-23 MED ORDER — PROPOFOL 1000 MG/100ML IV EMUL
INTRAVENOUS | Status: AC
  Filled 2018-07-23: qty 100

## 2018-07-23 MED ORDER — DEXAMETHASONE SODIUM PHOSPHATE 10 MG/ML IJ SOLN
INTRAMUSCULAR | Status: DC | PRN
  Administered 2018-07-23: 10 mg via INTRAVENOUS

## 2018-07-23 MED ORDER — 0.9 % SODIUM CHLORIDE (POUR BTL) OPTIME
TOPICAL | Status: DC | PRN
  Administered 2018-07-23 (×2): 1000 mL

## 2018-07-23 MED ORDER — FENTANYL CITRATE (PF) 250 MCG/5ML IJ SOLN
INTRAMUSCULAR | Status: AC
  Filled 2018-07-23: qty 5

## 2018-07-23 MED ORDER — PROPOFOL 10 MG/ML IV BOLUS
INTRAVENOUS | Status: AC
  Filled 2018-07-23: qty 20

## 2018-07-23 MED ORDER — ONDANSETRON HCL 4 MG/2ML IJ SOLN
INTRAMUSCULAR | Status: AC
  Filled 2018-07-23: qty 4

## 2018-07-23 MED ORDER — SODIUM CHLORIDE 0.9 % IV SOLN
INTRAVENOUS | Status: DC | PRN
  Administered 2018-07-23: 08:00:00 via INTRAVENOUS

## 2018-07-23 SURGICAL SUPPLY — 61 items
BLADE CLIPPER SURG (BLADE) IMPLANT
BNDG GAUZE ELAST 4 BULKY (GAUZE/BANDAGES/DRESSINGS) ×18 IMPLANT
CANISTER SUCT 3000ML PPV (MISCELLANEOUS) ×3 IMPLANT
COVER SURGICAL LIGHT HANDLE (MISCELLANEOUS) ×6 IMPLANT
COVER WAND RF STERILE (DRAPES) ×3 IMPLANT
DRAPE LAPAROSCOPIC ABDOMINAL (DRAPES) ×3 IMPLANT
DRAPE ORTHO SPLIT 77X108 STRL (DRAPES) ×2
DRAPE SURG 17X11 SM STRL (DRAPES) ×6 IMPLANT
DRAPE SURG ORHT 6 SPLT 77X108 (DRAPES) ×4 IMPLANT
DRAPE UTILITY 15X26 (DRAPE) ×3 IMPLANT
DRAPE UTILITY XL STRL (DRAPES) ×6 IMPLANT
DRAPE WARM FLUID 44X44 (DRAPE) ×3 IMPLANT
DRSG OPSITE POSTOP 4X10 (GAUZE/BANDAGES/DRESSINGS) IMPLANT
DRSG OPSITE POSTOP 4X8 (GAUZE/BANDAGES/DRESSINGS) IMPLANT
DRSG PAD ABDOMINAL 8X10 ST (GAUZE/BANDAGES/DRESSINGS) ×3 IMPLANT
ELECT CAUTERY BLADE 6.4 (BLADE) ×6 IMPLANT
ELECT REM PT RETURN 9FT ADLT (ELECTROSURGICAL) ×3
ELECTRODE REM PT RTRN 9FT ADLT (ELECTROSURGICAL) ×2 IMPLANT
GAUZE SPONGE 4X4 12PLY STRL (GAUZE/BANDAGES/DRESSINGS) ×6 IMPLANT
GLOVE BIO SURGEON STRL SZ8 (GLOVE) ×6 IMPLANT
GLOVE BIOGEL PI IND STRL 8 (GLOVE) ×4 IMPLANT
GLOVE BIOGEL PI INDICATOR 8 (GLOVE) ×2
GOWN STRL REUS W/ TWL LRG LVL3 (GOWN DISPOSABLE) ×8 IMPLANT
GOWN STRL REUS W/ TWL XL LVL3 (GOWN DISPOSABLE) ×4 IMPLANT
GOWN STRL REUS W/TWL LRG LVL3 (GOWN DISPOSABLE) ×8
GOWN STRL REUS W/TWL XL LVL3 (GOWN DISPOSABLE) ×4
KIT BASIN OR (CUSTOM PROCEDURE TRAY) ×3 IMPLANT
KIT OSTOMY DRAINABLE 2.75 STR (WOUND CARE) ×3 IMPLANT
KIT TURNOVER KIT B (KITS) ×3 IMPLANT
LIGASURE IMPACT 36 18CM CVD LR (INSTRUMENTS) ×3 IMPLANT
NS IRRIG 1000ML POUR BTL (IV SOLUTION) ×6 IMPLANT
PACK COLON (CUSTOM PROCEDURE TRAY) IMPLANT
PACK GENERAL/GYN (CUSTOM PROCEDURE TRAY) ×3 IMPLANT
PAD ABD 8X10 STRL (GAUZE/BANDAGES/DRESSINGS) ×30 IMPLANT
PAD ARMBOARD 7.5X6 YLW CONV (MISCELLANEOUS) ×3 IMPLANT
PENCIL BUTTON HOLSTER BLD 10FT (ELECTRODE) IMPLANT
PENCIL SMOKE EVACUATOR (MISCELLANEOUS) ×3 IMPLANT
SPECIMEN JAR MEDIUM (MISCELLANEOUS) IMPLANT
SPONGE LAP 18X18 X RAY DECT (DISPOSABLE) IMPLANT
STAPLER PROXIMATE 75MM BLUE (STAPLE) ×3 IMPLANT
STAPLER VISISTAT 35W (STAPLE) ×3 IMPLANT
SUCTION POOLE TIP (SUCTIONS) ×3 IMPLANT
SURGILUBE 2OZ TUBE FLIPTOP (MISCELLANEOUS) ×15 IMPLANT
SUT NOVA 1 T20/GS 25DT (SUTURE) ×6 IMPLANT
SUT PDS AB 1 TP1 54 (SUTURE) IMPLANT
SUT PDS AB 1 TP1 96 (SUTURE) IMPLANT
SUT PROLENE 2 0 CT2 30 (SUTURE) IMPLANT
SUT PROLENE 2 0 KS (SUTURE) IMPLANT
SUT SILK 2 0 SH CR/8 (SUTURE) ×3 IMPLANT
SUT SILK 2 0 TIES 10X30 (SUTURE) ×3 IMPLANT
SUT SILK 3 0 SH CR/8 (SUTURE) ×3 IMPLANT
SUT SILK 3 0 TIES 10X30 (SUTURE) ×3 IMPLANT
SUT VIC AB 3-0 SH 18 (SUTURE) ×6 IMPLANT
SWAB COLLECTION DEVICE MRSA (MISCELLANEOUS) IMPLANT
SWAB CULTURE ESWAB REG 1ML (MISCELLANEOUS) IMPLANT
TOWEL OR 17X24 6PK STRL BLUE (TOWEL DISPOSABLE) ×3 IMPLANT
TOWEL OR 17X26 10 PK STRL BLUE (TOWEL DISPOSABLE) ×6 IMPLANT
TRAY FOLEY MTR SLVR 14FR STAT (SET/KITS/TRAYS/PACK) IMPLANT
TRAY PROCTOSCOPIC FIBER OPTIC (SET/KITS/TRAYS/PACK) IMPLANT
TUBE CONNECTING 12X1/4 (SUCTIONS) ×3 IMPLANT
UNDERPAD 30X30 (UNDERPADS AND DIAPERS) ×3 IMPLANT
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
## 2018-07-23 NOTE — Op Note (Signed)
07/10/2018 - 07/23/2018  9:30 AM  PATIENT:  Christopher [name]  53 y.o. male  PRE-OPERATIVE DIAGNOSIS:  BUTTOCK WOUND  POST-OPERATIVE DIAGNOSIS:  BUTTOCK WOUND  PROCEDURE:  Procedure(s): DEBRIDEMENT LEFT BUTTOCK  WOUND 15cmx3cmx2cm and 8cmx2cmx1cm  COLOSTOMY  SURGEON:  Surgeon(s): Violeta Gelinashompson, Taresa Montville, MD  ASSISTANTS: none   ANESTHESIA:   general  EBL:  Total I/O In: 315 [Blood:315] Out: -   BLOOD ADMINISTERED:1u CC PRBC  DRAINS: none   SPECIMEN:  Excision  DISPOSITION OF SPECIMEN:  PATHOLOGY  COUNTS:  YES  DICTATION: Reubin Milan.Dragon Dictation Excisional debridement:  1.  Procedure note: Christopher [name] is brought back to the operating room for planned debridement of his buttock wound and placement of a colostomy.  Informed consent was obtained.  He was brought directly from the trauma neuro intensive care unit on the ventilator to the operating room.  General anesthesia was administered by the anesthesia staff.  First, he was placed in lateral position with left side up.  Dr. Ulice Boldillingham did a dressing change on his L thigh.  His buttock wound was then prepped and draped in a sterile fashion.  We did a timeout procedure.  I debrided 2 main areas using cautery.  The first was at the left upper portion of the wound.  I excised skin, subcutaneous fat and some muscle.  Dimensions are listed above. Next I debrided some left lower medial tissue near his anal area which is also necrotic.  I used Bovie cautery and scissors.  There were some scattered areas of necrotic muscle which I also debrided with scissors.  Hemostasis was obtained.  The wound was irrigated.  A sterile wet-to-dry dressing was applied.  Counts were correct for that portion of the procedure.  2.  Tool used for debridement (curette, scapel, etc.)  Bovie, scissors  3.  Frequency of surgical debridement.   Second tome  4.  Measurement of total devitalized tissue (wound surface) before and after surgical debridement.  15cmx3cmx2cm and  8cmx2cmx1cm removed  5.  Area and depth of devitalized tissue removed from wound.  above  6.  Blood loss and description of tissue removed.  Minimal blood loss, necrotic skin, fat and muscle  7.  Evidence of the progress of the wound's response to treatment.  A.  Current wound volume (current dimensions and depth).  See previous  B.  Presence (and extent of) of infection.  No obvious  C.  Presence (and extent of) of non viable tissue.  removed  D.  Other material in the wound that is expected to inhibit healing.  no  8.  Was there any viable tissue removed (measurements):minimal  Next he was repositioned supine.  His abdomen was prepped and draped in a sterile fashion.  We did another timeout procedure.  I remove the remaining staples from his abdominal wound.  I then removed his previous PDS sutures.  There was some scattered necrosis of the fascia which was gently cleaned away.  I then gently explored the abdomen.  The omentum was freed up from the anterior abdominal wall.  The pelvic hematoma was noted to be reabsorbing.  No significant abnormalities were noted.  I mobilized his sigmoid colon from lateral peritoneal attachments.  I divided the distal sigmoid using a GIA-75 stapler.  I took back the mesentery a little bit using the LigaSure to mobilize the colon for colostomy.  I then made a circular incision in the left lower quadrant.  I cored out the skin and subcutaneous fat.  A  cruciate incision was made in the muscle and this was enlarged to admit to digits.  I then brought out the colon which passed easily and reached nicely.  The abdomen was irrigated and hemostasis was ensured.  I then cleaned up the fascia a little bit and closed with multiple interrupted #1 Novafil sutures in a figure-of-eight fashion.  I left the wound open with a sterile wet-to-dry dressing.  I matured the colostomy with 3-0 Vicryl.  An ostomy appliance was applied.  All counts were correct.  He tolerated the procedure  well without apparent complication was taken back to the intensive care unit on the ventilator in critical condition.  PATIENT DISPOSITION:  ICU - intubated and critically ill.   Delay start of Pharmacological VTE agent (>24hrs) due to surgical blood loss or risk of bleeding:  no  Violeta Gelinas, MD, MPH, FACS Pager: [phone]  2/10/20209:30 AM

## 2018-07-23 NOTE — Transfer of Care (Signed)
Immediate Anesthesia Transfer of Care Note  Patient: Rhodes [place]  Procedure(s) Performed: COLOSTOMY (N/A Abdomen) DEBRIDEMENT LEFT BUTTOCK  WOUND (Left Buttocks)  Patient Location: ICU  Anesthesia Type:General  Level of Consciousness: sedated and Patient remains intubated per anesthesia plan  Airway & Oxygen Therapy: Patient remains intubated per anesthesia plan and Patient placed on Ventilator (see vital sign flow sheet for setting)  Post-op Assessment: Report given to RN and Post -op Vital signs reviewed and stable  Post vital signs: Reviewed and stable  Last Vitals:  Vitals Value Taken Time  BP 110/65 07/23/2018 10:30 AM  Temp 35.9 C 07/23/2018 10:38 AM  Pulse 103 07/23/2018 10:38 AM  Resp 25 07/23/2018 10:38 AM  SpO2 99 % 07/23/2018 10:38 AM  Vitals shown include unvalidated device data.  Last Pain:  Vitals:   07/23/18 0400  TempSrc: Esophageal  PainSc:          Complications: No apparent anesthesia complications

## 2018-07-23 NOTE — Anesthesia Postprocedure Evaluation (Signed)
Anesthesia Post Note  Patient: Christopher [name]  Procedure(s) Performed: COLOSTOMY (N/A Abdomen) DEBRIDEMENT LEFT BUTTOCK  WOUND (Left Buttocks)     Patient location during evaluation: ICU Anesthesia Type: General Level of consciousness: patient remains intubated per anesthesia plan Pain management: pain level controlled Vital Signs Assessment: post-procedure vital signs reviewed and stable Respiratory status: respiratory function stable and patient remains intubated per anesthesia plan Cardiovascular status: blood pressure returned to baseline and stable Postop Assessment: no apparent nausea or vomiting Anesthetic complications: no    Last Vitals:  Vitals:   07/23/18 0713 07/23/18 1000  BP:  120/69  Pulse: (!) 115 (!) 111  Resp: (!) 23 18  Temp: 37.1 C   SpO2: 100% 100%    Last Pain:  Vitals:   07/23/18 0400  TempSrc: Esophageal  PainSc:                  Kaylyn Layer

## 2018-07-24 ENCOUNTER — Encounter (HOSPITAL_COMMUNITY): Payer: Self-pay | Admitting: General Surgery

## 2018-07-24 ENCOUNTER — Inpatient Hospital Stay (HOSPITAL_COMMUNITY): Payer: No Typology Code available for payment source

## 2018-07-24 LAB — CBC
HCT: 25 % — ABNORMAL LOW (ref 39.0–52.0)
Hemoglobin: 7.5 g/dL — ABNORMAL LOW (ref 13.0–17.0)
MCH: 28.2 pg (ref 26.0–34.0)
MCHC: 30 g/dL (ref 30.0–36.0)
MCV: 94 fL (ref 80.0–100.0)
Platelets: 396 10*3/uL (ref 150–400)
RBC: 2.66 MIL/uL — ABNORMAL LOW (ref 4.22–5.81)
RDW: 19.2 % — ABNORMAL HIGH (ref 11.5–15.5)
WBC: 10.4 10*3/uL (ref 4.0–10.5)
nRBC: 0.4 % — ABNORMAL HIGH (ref 0.0–0.2)

## 2018-07-24 LAB — GLUCOSE, CAPILLARY
Glucose-Capillary: 184 mg/dL — ABNORMAL HIGH (ref 70–99)
Glucose-Capillary: 191 mg/dL — ABNORMAL HIGH (ref 70–99)
Glucose-Capillary: 193 mg/dL — ABNORMAL HIGH (ref 70–99)
Glucose-Capillary: 198 mg/dL — ABNORMAL HIGH (ref 70–99)
Glucose-Capillary: 224 mg/dL — ABNORMAL HIGH (ref 70–99)
Glucose-Capillary: 229 mg/dL — ABNORMAL HIGH (ref 70–99)

## 2018-07-24 LAB — BASIC METABOLIC PANEL
Anion gap: 8 (ref 5–15)
BUN: 49 mg/dL — ABNORMAL HIGH (ref 6–20)
CO2: 21 mmol/L — ABNORMAL LOW (ref 22–32)
Calcium: 7.9 mg/dL — ABNORMAL LOW (ref 8.9–10.3)
Chloride: 119 mmol/L — ABNORMAL HIGH (ref 98–111)
Creatinine, Ser: 0.73 mg/dL (ref 0.61–1.24)
GFR calc Af Amer: >60 mL/min (ref >60)
GFR calc non Af Amer: >60 mL/min (ref >60)
Glucose, Bld: 238 mg/dL — ABNORMAL HIGH (ref 70–99)
Potassium: 4 mmol/L (ref 3.5–5.1)
Sodium: 148 mmol/L — ABNORMAL HIGH (ref 135–145)

## 2018-07-24 MED ORDER — CLONAZEPAM 0.5 MG PO TABS
0.5000 mg | ORAL_TABLET | Freq: Two times a day (BID) | ORAL | Status: DC
  Administered 2018-07-24 – 2018-08-09 (×34): 0.5 mg
  Filled 2018-07-24 (×35): qty 1

## 2018-07-24 MED ORDER — CHLORHEXIDINE GLUCONATE CLOTH 2 % EX PADS
6.0000 | MEDICATED_PAD | Freq: Once | CUTANEOUS | Status: AC
  Administered 2018-07-25: 6 via TOPICAL

## 2018-07-24 MED ORDER — PIVOT 1.5 CAL PO LIQD
1000.0000 mL | ORAL | Status: DC
  Administered 2018-07-24 – 2018-07-25 (×2): 1000 mL

## 2018-07-24 MED ORDER — DEXTROSE 5 % IV SOLN
3.0000 g | INTRAVENOUS | Status: DC
  Filled 2018-07-24: qty 3000

## 2018-07-24 MED ORDER — CHLORHEXIDINE GLUCONATE CLOTH 2 % EX PADS: 6.0000 | MEDICATED_PAD | Freq: Once | CUTANEOUS | Status: AC

## 2018-07-24 NOTE — Consult Note (Signed)
WOC Nurse ostomy consult note Stoma type/location: LLQ diverting colostomy.  Stool in pouch Stomal assessment/size: 2" pink and moist  Peristomal assessment: intact Treatment options for stomal/peristomal skin: barrier ring and 2 piece pouch system Output soft brown stool Ostomy pouching: 2pc. With barrier ring  Education provided: Patient is intubated and nonparticipative in care.  None today.  Enrolled patient in [employer] DC program: No WOC team will follow and remain available to patient, medical and nursing teams.  Maple Hudson MSN, RN, FNP-BC CWON Wound, Ostomy, Continence Nurse Pager [phone]
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
## 2018-07-24 NOTE — Care Management (Signed)
Clinical update faxed to Laretta Bolsterindy Hale, [facility] Case Manager for patient.    Quintella BatonJulie W. Chantal Worthey, RN, BSN  Trauma/Neuro ICU Case Manager [phone]
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
## 2018-07-25 ENCOUNTER — Inpatient Hospital Stay (HOSPITAL_COMMUNITY): Payer: No Typology Code available for payment source | Admitting: Anesthesiology

## 2018-07-25 ENCOUNTER — Encounter (HOSPITAL_COMMUNITY)
Admission: EM | Disposition: A | Discharge status: Nursing Home (Includes ICF) | Payer: Self-pay | Source: Home / Self Care

## 2018-07-25 ENCOUNTER — Encounter (HOSPITAL_COMMUNITY): Payer: Self-pay | Admitting: Plastic Surgery

## 2018-07-25 DIAGNOSIS — S31819A Unspecified open wound of right buttock, initial encounter: Secondary | ICD-10-CM

## 2018-07-25 DIAGNOSIS — S31829A Unspecified open wound of left buttock, initial encounter: Secondary | ICD-10-CM

## 2018-07-25 DIAGNOSIS — S3130XA Unspecified open wound of scrotum and testes, initial encounter: Secondary | ICD-10-CM

## 2018-07-25 DIAGNOSIS — S81802A Unspecified open wound, left lower leg, initial encounter: Secondary | ICD-10-CM

## 2018-07-25 HISTORY — PX: I & D EXTREMITY: SHX5045

## 2018-07-25 LAB — CBC
HCT: 24.3 % — ABNORMAL LOW (ref 39.0–52.0)
Hemoglobin: 7.3 g/dL — ABNORMAL LOW (ref 13.0–17.0)
MCH: 28.7 pg (ref 26.0–34.0)
MCHC: 30 g/dL (ref 30.0–36.0)
MCV: 95.7 fL (ref 80.0–100.0)
Platelets: 443 10*3/uL — ABNORMAL HIGH (ref 150–400)
RBC: 2.54 MIL/uL — ABNORMAL LOW (ref 4.22–5.81)
RDW: 19.9 % — ABNORMAL HIGH (ref 11.5–15.5)
WBC: 10.1 10*3/uL (ref 4.0–10.5)
nRBC: 1 % — ABNORMAL HIGH (ref 0.0–0.2)

## 2018-07-25 LAB — GLUCOSE, CAPILLARY
Glucose-Capillary: 179 mg/dL — ABNORMAL HIGH (ref 70–99)
Glucose-Capillary: 138 mg/dL — ABNORMAL HIGH (ref 70–99)
Glucose-Capillary: 118 mg/dL — ABNORMAL HIGH (ref 70–99)
Glucose-Capillary: 105 mg/dL — ABNORMAL HIGH (ref 70–99)
Glucose-Capillary: 148 mg/dL — ABNORMAL HIGH (ref 70–99)

## 2018-07-25 LAB — BASIC METABOLIC PANEL
Anion gap: 8 (ref 5–15)
BUN: 38 mg/dL — ABNORMAL HIGH (ref 6–20)
CO2: 24 mmol/L (ref 22–32)
Calcium: 8 mg/dL — ABNORMAL LOW (ref 8.9–10.3)
Chloride: 119 mmol/L — ABNORMAL HIGH (ref 98–111)
Creatinine, Ser: 0.65 mg/dL (ref 0.61–1.24)
GFR calc Af Amer: >60 mL/min (ref >60)
GFR calc non Af Amer: >60 mL/min (ref >60)
Glucose, Bld: 171 mg/dL — ABNORMAL HIGH (ref 70–99)
Potassium: 3.3 mmol/L — ABNORMAL LOW (ref 3.5–5.1)
Sodium: 151 mmol/L — ABNORMAL HIGH (ref 135–145)

## 2018-07-25 SURGERY — IRRIGATION AND DEBRIDEMENT EXTREMITY
Anesthesia: General | Site: Buttocks | Laterality: Left

## 2018-07-25 MED ORDER — LACTATED RINGERS IV SOLN
INTRAVENOUS | Status: DC | PRN
  Administered 2018-07-25: 14:00:00 via INTRAVENOUS

## 2018-07-25 MED ORDER — FENTANYL CITRATE (PF) 250 MCG/5ML IJ SOLN
INTRAMUSCULAR | Status: AC
  Filled 2018-07-25: qty 5

## 2018-07-25 MED ORDER — ROCURONIUM BROMIDE 50 MG/5ML IV SOSY
PREFILLED_SYRINGE | INTRAVENOUS | Status: AC
  Filled 2018-07-25: qty 10

## 2018-07-25 MED ORDER — ROCURONIUM BROMIDE 10 MG/ML (PF) SYRINGE
PREFILLED_SYRINGE | INTRAVENOUS | Status: DC | PRN
  Administered 2018-07-25 (×4): 50 mg via INTRAVENOUS

## 2018-07-25 MED ORDER — SODIUM CHLORIDE 0.9 % IV SOLN
INTRAVENOUS | Status: AC
  Filled 2018-07-25: qty 500000

## 2018-07-25 MED ORDER — MIDAZOLAM HCL 5 MG/5ML IJ SOLN
INTRAMUSCULAR | Status: DC | PRN
  Administered 2018-07-25: 2 mg via INTRAVENOUS

## 2018-07-25 MED ORDER — LIDOCAINE 2% (20 MG/ML) 5 ML SYRINGE
INTRAMUSCULAR | Status: AC
  Filled 2018-07-25: qty 5

## 2018-07-25 MED ORDER — PIPERACILLIN-TAZOBACTAM 3.375 G IVPB 30 MIN
3.3750 g | INTRAVENOUS | Status: AC
  Administered 2018-07-25: 3.375 g via INTRAVENOUS
  Filled 2018-07-25: qty 50

## 2018-07-25 MED ORDER — PROPOFOL 10 MG/ML IV BOLUS
INTRAVENOUS | Status: AC
  Filled 2018-07-25: qty 20

## 2018-07-25 MED ORDER — SODIUM CHLORIDE 0.9 % IV SOLN
INTRAVENOUS | Status: DC | PRN
  Administered 2018-07-25: 65 ug/min via INTRAVENOUS
  Administered 2018-07-25: 25 ug/min via INTRAVENOUS

## 2018-07-25 MED ORDER — PHENYLEPHRINE 40 MCG/ML (10ML) SYRINGE FOR IV PUSH (FOR BLOOD PRESSURE SUPPORT)
PREFILLED_SYRINGE | INTRAVENOUS | Status: AC
  Filled 2018-07-25: qty 10

## 2018-07-25 MED ORDER — ALBUMIN HUMAN 5 % IV SOLN
INTRAVENOUS | Status: DC | PRN
  Administered 2018-07-25: 16:00:00 via INTRAVENOUS

## 2018-07-25 MED ORDER — 0.9 % SODIUM CHLORIDE (POUR BTL) OPTIME
TOPICAL | Status: DC | PRN
  Administered 2018-07-25: 1000 mL

## 2018-07-25 MED ORDER — FENTANYL CITRATE (PF) 100 MCG/2ML IJ SOLN
INTRAMUSCULAR | Status: DC | PRN
  Administered 2018-07-25: 50 ug via INTRAVENOUS

## 2018-07-25 MED ORDER — ONDANSETRON HCL 4 MG/2ML IJ SOLN
INTRAMUSCULAR | Status: AC
  Filled 2018-07-25: qty 2

## 2018-07-25 MED ORDER — DEXAMETHASONE SODIUM PHOSPHATE 10 MG/ML IJ SOLN
INTRAMUSCULAR | Status: AC
  Filled 2018-07-25: qty 1

## 2018-07-25 MED ORDER — VASOPRESSIN 20 UNIT/ML IV SOLN
INTRAVENOUS | Status: AC
  Filled 2018-07-25: qty 1

## 2018-07-25 MED ORDER — SODIUM CHLORIDE 0.9 % IV SOLN
INTRAVENOUS | Status: DC | PRN
  Administered 2018-07-25: 500 mL

## 2018-07-25 MED ORDER — ALBUMIN HUMAN 5 % IV SOLN: INTRAVENOUS | Status: DC | PRN

## 2018-07-25 MED ORDER — MIDAZOLAM HCL 2 MG/2ML IJ SOLN
INTRAMUSCULAR | Status: AC
  Filled 2018-07-25: qty 2

## 2018-07-25 SURGICAL SUPPLY — 54 items
APPLIER CLIP 9.375 SM OPEN (CLIP) ×2
BAG DECANTER FOR FLEXI CONT (MISCELLANEOUS) ×1 IMPLANT
BANDAGE ACE 4X5 VEL STRL LF (GAUZE/BANDAGES/DRESSINGS) IMPLANT
BNDG GAUZE ELAST 4 BULKY (GAUZE/BANDAGES/DRESSINGS) ×4 IMPLANT
CANISTER SUCT 3000ML PPV (MISCELLANEOUS) ×2 IMPLANT
CATH ROBINSON RED A/P 18FR (CATHETERS) ×1 IMPLANT
CLIP APPLIE 9.375 SM OPEN (CLIP) IMPLANT
COVER SURGICAL LIGHT HANDLE (MISCELLANEOUS) ×2 IMPLANT
COVER WAND RF STERILE (DRAPES) ×2 IMPLANT
DRAPE HALF SHEET 40X57 (DRAPES) IMPLANT
DRAPE INCISE IOBAN 66X45 STRL (DRAPES) IMPLANT
DRAPE ORTHO SPLIT 77X108 STRL (DRAPES) ×2
DRAPE SURG ORHT 6 SPLT 77X108 (DRAPES) IMPLANT
DRESSING HYDROCOLLOID 4X4 (GAUZE/BANDAGES/DRESSINGS) ×2 IMPLANT
DRSG ADAPTIC 3X8 NADH LF (GAUZE/BANDAGES/DRESSINGS) IMPLANT
DRSG CUTIMED SORBACT 7X9 (GAUZE/BANDAGES/DRESSINGS) ×5 IMPLANT
DRSG PAD ABDOMINAL 8X10 ST (GAUZE/BANDAGES/DRESSINGS) ×9 IMPLANT
DRSG VAC ATS LRG SENSATRAC (GAUZE/BANDAGES/DRESSINGS) IMPLANT
DRSG VAC ATS MED SENSATRAC (GAUZE/BANDAGES/DRESSINGS) IMPLANT
DRSG VAC ATS SM SENSATRAC (GAUZE/BANDAGES/DRESSINGS) IMPLANT
ELECT REM PT RETURN 9FT ADLT (ELECTROSURGICAL) ×2
ELECTRODE REM PT RTRN 9FT ADLT (ELECTROSURGICAL) ×1 IMPLANT
GAUZE SPONGE 4X4 12PLY STRL (GAUZE/BANDAGES/DRESSINGS) ×1 IMPLANT
GEL ULTRASOUND 20GR AQUASONIC (MISCELLANEOUS) IMPLANT
GLOVE BIO SURGEON STRL SZ 6.5 (GLOVE) ×5 IMPLANT
GOWN STRL REUS W/ TWL LRG LVL3 (GOWN DISPOSABLE) ×2 IMPLANT
GOWN STRL REUS W/TWL LRG LVL3 (GOWN DISPOSABLE) ×3
HANDPIECE INTERPULSE COAX TIP (DISPOSABLE)
KIT BASIN OR (CUSTOM PROCEDURE TRAY) ×2 IMPLANT
KIT TURNOVER KIT B (KITS) ×2 IMPLANT
MATRIX WOUND 3-LAYER 10X15 (Tissue) ×6 IMPLANT
MATRIX WOUND 3-LAYER 5X5 (Tissue) ×1 IMPLANT
MATRIX WOUND 3-LAYER 7X10 (Tissue) ×2 IMPLANT
MICROMATRIX 1000MG (Tissue) ×18 IMPLANT
MICROMATRIX 500MG (Tissue) ×4 IMPLANT
NS IRRIG 1000ML POUR BTL (IV SOLUTION) ×2 IMPLANT
PACK GENERAL/GYN (CUSTOM PROCEDURE TRAY) ×2 IMPLANT
PAD ARMBOARD 7.5X6 YLW CONV (MISCELLANEOUS) ×4 IMPLANT
PAD NEG PRESSURE SENSATRAC (MISCELLANEOUS) IMPLANT
PENCIL BUTTON HOLSTER BLD 10FT (ELECTRODE) ×1 IMPLANT
SCRUB POVIDONE IODINE 4 OZ (MISCELLANEOUS) ×1 IMPLANT
SET HNDPC FAN SPRY TIP SCT (DISPOSABLE) IMPLANT
SOL PREP POV-IOD 4OZ 10% (MISCELLANEOUS) ×4 IMPLANT
SOLUTION PARTIC MCRMTRX 1000MG (Tissue) IMPLANT
SOLUTION PARTIC MCRMTRX 500MG (Tissue) IMPLANT
SPONGE LAP 18X18 X RAY DECT (DISPOSABLE) ×4 IMPLANT
SURGILUBE 2OZ TUBE FLIPTOP (MISCELLANEOUS) ×5 IMPLANT
SUT ETHILON 3 0 PS 1 (SUTURE) ×2 IMPLANT
SUT VIC AB 4-0 PS2 27 (SUTURE) ×11 IMPLANT
SUT VIC AB 5-0 PS2 18 (SUTURE) ×24 IMPLANT
TAPE CLOTH SURG 6X10 WHT LF (GAUZE/BANDAGES/DRESSINGS) ×2 IMPLANT
TOWEL OR 17X24 6PK STRL BLUE (TOWEL DISPOSABLE) ×1 IMPLANT
TOWEL OR 17X26 10 PK STRL BLUE (TOWEL DISPOSABLE) ×2 IMPLANT
UNDERPAD 30X30 (UNDERPADS AND DIAPERS) IMPLANT

## 2018-07-25 NOTE — Anesthesia Procedure Notes (Signed)
Date/Time: 07/25/2018 2:29 PM Performed by: Lovie Chol, CRNA Pre-anesthesia Checklist: Patient identified, Emergency Drugs available, Suction available and Patient being monitored Patient Re-evaluated:Patient Re-evaluated prior to induction Oxygen Delivery Method: Circle system utilized Preoxygenation: Pre-oxygenation with 100% oxygen Induction Type: Inhalational induction with existing ETT

## 2018-07-25 NOTE — Progress Notes (Signed)
Pt taken off vent by this RT and manually ventilated to OR by CRNA
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
## 2018-07-25 NOTE — Progress Notes (Signed)
Received pt from OR/[facility] at 1757. Pt returned to vent with no complications. VS within normal limits. FiO2 decreased from 100 to 60% post OR
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
## 2018-07-25 NOTE — Op Note (Signed)
DATE OF OPERATION: 07/25/2018  [facility]  PREOPERATIVE DIAGNOSIS: Gluteal wound, left entire leg wound and scrotal wound  POSTOPERATIVE DIAGNOSIS: Same  PROCEDURE: 1.  Excision of necrotic muscle of gluteus 8 x 10 cm 2.  Preparation of gluteal area 30 x 40 for placement of Acell 10 x 15 cm sheet (3), 7 x 10 cm (one) and 8 gm powder. 3.  Excision of skin and fat .5 x 5 cm left leg anteriorly and 5 x 5 cm necrotic skin and fat posteriorly. 4.  Preparation of left leg 40 x 60 cm for placement of Acell 10 x 15 cm sheet (2), 5 x 5 sheet (1) and 2 gm powder 5.  Preparation of scrotum left  12 x 12 cm for placement of Acell 10 x 15 cm sheet and 250 mg powder. 6.  Preparation of scrotum right 12 x 12 cm for placement of Acell 7 x 10 cm sheet and 250 mg powder.  SURGEON: Claire Sanger Dillingham, DO  ASSISTANT: Carmen  Mayo, PA  EBL: 50 cc  CONDITION: Stable  COMPLICATIONS: None  INDICATION: The patient, Christopher [name], is a 53 y.o. [name] born on [birth year], is here for treatment of full thickness wound to the left leg, scrotum and gluteal area.   PROCEDURE DETAILS:  The patient was seen prior to surgery and marked.  The IV antibiotics were given. The patient was taken to the operating room and given a general anesthetic. A standard time out was performed and all information was confirmed by those in the room. SCDs were placed.   The patient was placed on the right lateral position on a bean bag.  He was prepped and draped.   Gluteal area:  The area was irrigated with antibiotic solution and saline.  There was a noted improvement in the overall appearance of the muscles from the previous surgery.  The right gluteus area appeared to have viable muscle without any additional necrosis.  The left gluteus area and the lateral portion of the gluteus maximus that was nonviable and necrotic.  There was a 2 x 3 cm area of muscle excised superficially.  The muscle superiorly  and deep to the gluteus maximus, appeared to be the gluteus medius, was nearly all necrotic and this was excised.  This exposed the hardware that had been placed for repair of his fracture.  The hardware appeared to be secure.  This was irrigated with antibiotic solution and saline.  This resection was 8 x 10 cm of muscle.  Two clips were placed to control the bleeding.  Hemostasis was additionally achieved with electrocautery.  The ACell 7 x 10 cm sheet and a gram of powder was applied on the bone.  The ACell sheet was secured with 5-0 Vicryl . There was portion of viable tissue from the back that was flapped over this and secured to the gluteus maximus.  There was a portion of the quadratus femoris that was nonviable.  This was excised for an area of 2 x 2 cm.  The gluteus area was 30 x 40 cm.  Seven grams of powder was applied over the gluteus area and then covered with 3 sheets of 10 x 15 ACell.  The sheaths were secured to any remaining fascia and some skin and muscle with 5-0 Vicryl.  Several sheets of sore VAC was applied and secured with Vicryl and then covered with Kerlix and KY gel.  ABDs were applied.  The patient was then  placed in the supine position.  He was prepped and draped again.  Left leg: The area was irrigated with antibiotic solution and saline.  The nonviable tissue of skin and fat at the edges was excised for an area of 0.5 x 5 cm.  Hemostasis was achieved with electrocautery.  The left anterior medial and lateral leg was an area of 40 x 60 cm.  Two grams of powder and 2 sheets of the ACell 10 x 15 were applied to the medial and superior portion of the leg.  They were secured to the fascia with 5-0 Vicryl . The ACell sheets that had been applied at the last trip to the operating room were in place and appear to be incorporating.  The leg was then covered with sore VAC and this was secured with 4-0 Vicryl.  KY gel Kerlix and ABDs were applied.  On the posterior aspect of the leg there was  an area necrotic skin and soft tissue 5 x 5 cm.  This was excised with the 10 blade.  Hemostasis was achieved with electrocautery.  The 5 x 5 cm sheet of ACell was applied and secured with 5-0 Vicryl.  The sore VAC was applied over the ACell and secured with 4-0 Vicryl.  This was covered with KY gel and 4 x 4 gauze.    Left scrotum: The area was irrigated with antibiotic solution and saline.  The nonviable tissue at the edges was excised.  The ACell sheet (12 x 12) and powder 250 mg was placed on the left testicle and wrapped.  The ACell was secured to itself with 5-0 Vicryl as well as proximally.  The sore back was then wrapped around the testicle and secured to itself with a 4-0 Vicryl.  KY gel and 4 x 4 gauze was applied.  Right scrotum: The area was irrigated with antibiotic solution and saline.  The ACell (12 x 12 cm) sheet and 250 mg of powder was applied to the right testicle and wrapped.  A portion of the leftover sheet of the left testicle was additionally applied and secured with 5-0 Vicryl.  The sore back was then wrapped around the testicle. KY gel and 4 x 4 gauze was applied.  ABDs were placed.  The patient was allowed to wake up and taken to recovery room in stable condition at the end of the case. The family was notified at the end of the case.

## 2018-07-25 NOTE — Plan of Care (Signed)
Pt is currently on tube feeds due to intubation.  Will need swallow evaluation when patient is extubated to determine diet order. Will continue to monitor. Jaclyn Shaggy RN
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
## 2018-07-25 NOTE — OR Nursing (Signed)
Benita Gutter, CRNA called 4[facility] ICU RN to report we would be arriving to retrieve patient in 10 minutes.

## 2018-07-25 NOTE — OR Nursing (Signed)
Elliot Dally, CRNA called [facility] ICU at 1740, spoke with RN, to report we would be arriving in 10 minutes with patient.

## 2018-07-26 ENCOUNTER — Encounter (HOSPITAL_COMMUNITY): Payer: Self-pay | Admitting: Plastic Surgery

## 2018-07-26 ENCOUNTER — Inpatient Hospital Stay (HOSPITAL_COMMUNITY): Payer: No Typology Code available for payment source

## 2018-07-26 LAB — BASIC METABOLIC PANEL
Anion gap: 12 (ref 5–15)
BUN: 27 mg/dL — ABNORMAL HIGH (ref 6–20)
CO2: 22 mmol/L (ref 22–32)
Calcium: 7.8 mg/dL — ABNORMAL LOW (ref 8.9–10.3)
Chloride: 119 mmol/L — ABNORMAL HIGH (ref 98–111)
Creatinine, Ser: 0.6 mg/dL — ABNORMAL LOW (ref 0.61–1.24)
GFR calc Af Amer: >60 mL/min (ref >60)
GFR calc non Af Amer: >60 mL/min (ref >60)
Glucose, Bld: 230 mg/dL — ABNORMAL HIGH (ref 70–99)
Potassium: 3.8 mmol/L (ref 3.5–5.1)
Sodium: 153 mmol/L — ABNORMAL HIGH (ref 135–145)

## 2018-07-26 LAB — CBC
HCT: 27.3 % — ABNORMAL LOW (ref 39.0–52.0)
Hemoglobin: 7.8 g/dL — ABNORMAL LOW (ref 13.0–17.0)
MCH: 28.2 pg (ref 26.0–34.0)
MCHC: 28.6 g/dL — ABNORMAL LOW (ref 30.0–36.0)
MCV: 98.6 fL (ref 80.0–100.0)
Platelets: 512 10*3/uL — ABNORMAL HIGH (ref 150–400)
RBC: 2.77 MIL/uL — ABNORMAL LOW (ref 4.22–5.81)
RDW: 19.9 % — ABNORMAL HIGH (ref 11.5–15.5)
WBC: 12.5 10*3/uL — ABNORMAL HIGH (ref 4.0–10.5)
nRBC: 0.4 % — ABNORMAL HIGH (ref 0.0–0.2)

## 2018-07-26 LAB — GLUCOSE, CAPILLARY
Glucose-Capillary: 186 mg/dL — ABNORMAL HIGH (ref 70–99)
Glucose-Capillary: 172 mg/dL — ABNORMAL HIGH (ref 70–99)
Glucose-Capillary: 195 mg/dL — ABNORMAL HIGH (ref 70–99)
Glucose-Capillary: 171 mg/dL — ABNORMAL HIGH (ref 70–99)
Glucose-Capillary: 149 mg/dL — ABNORMAL HIGH (ref 70–99)
Glucose-Capillary: 193 mg/dL — ABNORMAL HIGH (ref 70–99)

## 2018-07-26 LAB — TROPONIN I: Troponin I: <0.03 ng/mL (ref <0.03)

## 2018-07-26 LAB — TRIGLYCERIDES: Triglycerides: 444 mg/dL — ABNORMAL HIGH (ref <150)

## 2018-07-26 MED ORDER — PRO-STAT SUGAR FREE PO LIQD
60.0000 mL | Freq: Two times a day (BID) | ORAL | Status: DC
  Administered 2018-07-27 – 2018-09-03 (×71): 60 mL
  Filled 2018-07-26 (×71): qty 60

## 2018-07-26 MED ORDER — ALBUMIN HUMAN 5 % IV SOLN
12.5000 g | Freq: Once | INTRAVENOUS | Status: AC
  Administered 2018-07-26: 12.5 g via INTRAVENOUS
  Filled 2018-07-26: qty 250

## 2018-07-26 MED ORDER — SODIUM BICARBONATE 650 MG PO TABS
650.0000 mg | ORAL_TABLET | Freq: Once | ORAL | Status: AC
  Administered 2018-07-26: 650 mg
  Filled 2018-07-26: qty 1

## 2018-07-26 MED ORDER — DEXMEDETOMIDINE HCL IN NACL 200 MCG/50ML IV SOLN: 0.4000 ug/kg/h | INTRAVENOUS | Status: DC

## 2018-07-26 MED ORDER — DEXMEDETOMIDINE HCL IN NACL 400 MCG/100ML IV SOLN
0.4000 ug/kg/h | INTRAVENOUS | Status: DC
  Administered 2018-07-26 (×2): 1.2 ug/kg/h via INTRAVENOUS
  Administered 2018-07-26: 0.4 ug/kg/h via INTRAVENOUS
  Administered 2018-07-26: 1 ug/kg/h via INTRAVENOUS
  Administered 2018-07-27 – 2018-08-02 (×30): 1.2 ug/kg/h via INTRAVENOUS
  Administered 2018-08-02 (×2): 1 ug/kg/h via INTRAVENOUS
  Administered 2018-08-02 – 2018-08-03 (×6): 1.2 ug/kg/h via INTRAVENOUS
  Administered 2018-08-03: 1 ug/kg/h via INTRAVENOUS
  Administered 2018-08-03 – 2018-08-06 (×10): 1.2 ug/kg/h via INTRAVENOUS
  Administered 2018-08-06: 09:00:00 via INTRAVENOUS
  Administered 2018-08-06: 1.1 ug/kg/h via INTRAVENOUS
  Administered 2018-08-06 – 2018-08-07 (×3): 1.2 ug/kg/h via INTRAVENOUS
  Administered 2018-08-07: 1.1 ug/kg/h via INTRAVENOUS
  Administered 2018-08-07 – 2018-08-14 (×34): 1.2 ug/kg/h via INTRAVENOUS
  Administered 2018-08-14: 0.8 ug/kg/h via INTRAVENOUS
  Administered 2018-08-14: 1.2 ug/kg/h via INTRAVENOUS
  Administered 2018-08-15 (×2): 0.9 ug/kg/h via INTRAVENOUS
  Administered 2018-08-15 (×2): 0.8 ug/kg/h via INTRAVENOUS
  Administered 2018-08-15: 0.9 ug/kg/h via INTRAVENOUS
  Administered 2018-08-16: 0.5 ug/kg/h via INTRAVENOUS
  Administered 2018-08-16: 0.9 ug/kg/h via INTRAVENOUS
  Administered 2018-08-16 – 2018-08-17 (×3): 0.8 ug/kg/h via INTRAVENOUS
  Administered 2018-08-18 – 2018-08-19 (×4): 0.6 ug/kg/h via INTRAVENOUS
  Administered 2018-08-19: 0.8 ug/kg/h via INTRAVENOUS
  Administered 2018-08-19: 0.6 ug/kg/h via INTRAVENOUS
  Administered 2018-08-20 (×2): 0.4 ug/kg/h via INTRAVENOUS
  Administered 2018-08-21: 0.8 ug/kg/h via INTRAVENOUS
  Administered 2018-08-21: 0.6 ug/kg/h via INTRAVENOUS
  Administered 2018-08-21: 0.8 ug/kg/h via INTRAVENOUS
  Administered 2018-08-22 (×3): 0.6 ug/kg/h via INTRAVENOUS
  Administered 2018-08-23: 0.8 ug/kg/h via INTRAVENOUS
  Administered 2018-08-23: 0.6 ug/kg/h via INTRAVENOUS
  Administered 2018-08-23: 1 ug/kg/h via INTRAVENOUS
  Administered 2018-08-24: 0.7 ug/kg/h via INTRAVENOUS
  Administered 2018-08-24: 0.6 ug/kg/h via INTRAVENOUS
  Administered 2018-08-24: 0.7 ug/kg/h via INTRAVENOUS
  Administered 2018-08-25: 0.2 ug/kg/h via INTRAVENOUS
  Administered 2018-08-26: 0.8 ug/kg/h via INTRAVENOUS
  Administered 2018-08-26 – 2018-08-27 (×2): 0.2 ug/kg/h via INTRAVENOUS
  Filled 2018-07-26 (×14): qty 100
  Filled 2018-07-26: qty 200
  Filled 2018-07-26 (×8): qty 100
  Filled 2018-07-26: qty 200
  Filled 2018-07-26 (×4): qty 100
  Filled 2018-07-26: qty 200
  Filled 2018-07-26 (×12): qty 100
  Filled 2018-07-26: qty 200
  Filled 2018-07-26 (×22): qty 100
  Filled 2018-07-26: qty 300
  Filled 2018-07-26 (×6): qty 100
  Filled 2018-07-26: qty 200
  Filled 2018-07-26 (×8): qty 100
  Filled 2018-07-26: qty 200
  Filled 2018-07-26 (×27): qty 100
  Filled 2018-07-26: qty 200
  Filled 2018-07-26 (×29): qty 100

## 2018-07-26 MED ORDER — PANCRELIPASE (LIP-PROT-AMYL) 10440-39150 UNITS PO TABS
20880.0000 [IU] | ORAL_TABLET | Freq: Once | ORAL | Status: AC
  Administered 2018-07-26: 20880 [IU]
  Filled 2018-07-26: qty 2

## 2018-07-26 MED ORDER — METOPROLOL TARTRATE 25 MG/10 ML ORAL SUSPENSION
25.0000 mg | Freq: Two times a day (BID) | ORAL | Status: DC
  Administered 2018-07-26 – 2018-07-29 (×6): 25 mg
  Filled 2018-07-26 (×8): qty 10

## 2018-07-26 MED ORDER — PIVOT 1.5 CAL PO LIQD
1000.0000 mL | ORAL | Status: DC
  Administered 2018-07-26 – 2018-08-13 (×22): 1000 mL
  Filled 2018-07-26 (×6): qty 1000
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
## 2018-07-26 NOTE — Progress Notes (Signed)
Notified MD that patients heart rhythm suddenly changed and looked funny.  Order received for EKG, troponin and Albumin.  Will continue to monitor patient.

## 2018-07-26 NOTE — Progress Notes (Signed)
Patient ID: Christopher [name], male   DOB: 05/14/1966, 53 y.o.   MRN: [id number] I met with his fiancee at the bedside and reviewed his progress with vent weaning and overall. She has to return to work next week so she will visit more in the evenings.  Georganna Skeans, MD, MPH, FACS Trauma: [phone] General Surgery: [phone]

## 2018-07-27 ENCOUNTER — Inpatient Hospital Stay (HOSPITAL_COMMUNITY): Payer: No Typology Code available for payment source

## 2018-07-27 LAB — BPAM RBC
Blood Product Expiration Date: 202003052359
Blood Product Expiration Date: 202003052359
ISSUE DATE / TIME: 202002100750
ISSUE DATE / TIME: 202002100750
Unit Type and Rh: 5100
Unit Type and Rh: 5100

## 2018-07-27 LAB — CBC
HCT: 23.8 % — ABNORMAL LOW (ref 39.0–52.0)
Hemoglobin: 6.7 g/dL — CL (ref 13.0–17.0)
MCH: 28.4 pg (ref 26.0–34.0)
MCHC: 28.2 g/dL — ABNORMAL LOW (ref 30.0–36.0)
MCV: 100.8 fL — ABNORMAL HIGH (ref 80.0–100.0)
Platelets: 436 10*3/uL — ABNORMAL HIGH (ref 150–400)
RBC: 2.36 MIL/uL — ABNORMAL LOW (ref 4.22–5.81)
RDW: 20 % — ABNORMAL HIGH (ref 11.5–15.5)
WBC: 10.9 10*3/uL — ABNORMAL HIGH (ref 4.0–10.5)
nRBC: 0.5 % — ABNORMAL HIGH (ref 0.0–0.2)

## 2018-07-27 LAB — TYPE AND SCREEN
ABO/RH(D): O POS
Antibody Screen: NEGATIVE
Unit division: 0
Unit division: 0

## 2018-07-27 LAB — GLUCOSE, CAPILLARY
Glucose-Capillary: 144 mg/dL — ABNORMAL HIGH (ref 70–99)
Glucose-Capillary: 176 mg/dL — ABNORMAL HIGH (ref 70–99)
Glucose-Capillary: 198 mg/dL — ABNORMAL HIGH (ref 70–99)
Glucose-Capillary: 202 mg/dL — ABNORMAL HIGH (ref 70–99)
Glucose-Capillary: 217 mg/dL — ABNORMAL HIGH (ref 70–99)
Glucose-Capillary: 222 mg/dL — ABNORMAL HIGH (ref 70–99)

## 2018-07-27 LAB — BASIC METABOLIC PANEL
Anion gap: 8 (ref 5–15)
BUN: 37 mg/dL — ABNORMAL HIGH (ref 6–20)
CO2: 23 mmol/L (ref 22–32)
Calcium: 8.3 mg/dL — ABNORMAL LOW (ref 8.9–10.3)
Chloride: 122 mmol/L — ABNORMAL HIGH (ref 98–111)
Creatinine, Ser: 0.65 mg/dL (ref 0.61–1.24)
GFR calc Af Amer: >60 mL/min (ref >60)
GFR calc non Af Amer: >60 mL/min (ref >60)
Glucose, Bld: 184 mg/dL — ABNORMAL HIGH (ref 70–99)
Potassium: 3.4 mmol/L — ABNORMAL LOW (ref 3.5–5.1)
Sodium: 153 mmol/L — ABNORMAL HIGH (ref 135–145)

## 2018-07-27 LAB — HEMOGLOBIN AND HEMATOCRIT, BLOOD
HCT: 28.3 % — ABNORMAL LOW (ref 39.0–52.0)
Hemoglobin: 8.3 g/dL — ABNORMAL LOW (ref 13.0–17.0)

## 2018-07-27 LAB — PREPARE RBC (CROSSMATCH)

## 2018-07-27 MED ORDER — SODIUM CHLORIDE 0.9% IV SOLUTION
Freq: Once | INTRAVENOUS | Status: AC
  Administered 2018-07-27: 14:00:00 via INTRAVENOUS
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
## 2018-07-27 NOTE — Progress Notes (Signed)
Cuff leak noticed last night from previous RT. Heard cuff leak this morning, added air to the cuff to appropriate measure. Seemed to have helped. Chest x-ray was done and orders were put in to advance ETT tube 3 cm. After advancing the ETT tube, could audibly hear cuff leak. Called trauma, who advised anesthesia to come look and see what they thought. Anesthesia came up and just advanced the tube some more, which seemed to solve the problem.

## 2018-07-27 NOTE — Progress Notes (Signed)
eLink Physician-Brief Progress Note Patient Name: Christopher [name] DOB: 06/29/1965 MRN: [id number]   Date of Service  07/27/2018  HPI/Events of Note  Hg < 7. Leg wound constantly oozing. On Vent fio2 at 30%. Discussed with bed side RN.   eICU Interventions  1 PRBC over 4 hrs. Watch for volume overload. Follow post H/H.      Intervention Category Intermediate Interventions: Diagnostic test evaluation;Communication with other healthcare providers and/or family  Ranee Gosselin 07/27/2018, 7:01 AM
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
## 2018-07-28 LAB — TYPE AND SCREEN
ABO/RH(D): O POS
Antibody Screen: NEGATIVE
Unit division: 0

## 2018-07-28 LAB — BPAM RBC
Blood Product Expiration Date: 202002152359
ISSUE DATE / TIME: 202002141151
Unit Type and Rh: 5100

## 2018-07-28 LAB — GLUCOSE, CAPILLARY
Glucose-Capillary: 156 mg/dL — ABNORMAL HIGH (ref 70–99)
Glucose-Capillary: 169 mg/dL — ABNORMAL HIGH (ref 70–99)
Glucose-Capillary: 171 mg/dL — ABNORMAL HIGH (ref 70–99)
Glucose-Capillary: 176 mg/dL — ABNORMAL HIGH (ref 70–99)
Glucose-Capillary: 180 mg/dL — ABNORMAL HIGH (ref 70–99)
Glucose-Capillary: 188 mg/dL — ABNORMAL HIGH (ref 70–99)

## 2018-07-28 MED ORDER — GABAPENTIN 300 MG/6ML PO SOLN
300.0000 mg | Freq: Three times a day (TID) | ORAL | Status: DC
  Administered 2018-07-28 – 2018-09-26 (×174): 300 mg
  Filled 2018-07-28 (×186): qty 6

## 2018-07-28 NOTE — Plan of Care (Signed)
  Problem: Education: Goal: Knowledge of General Education information will improve Description Including pain rating scale, medication(s)/side effects and non-pharmacologic comfort measures Outcome: Progressing   Problem: Clinical Measurements: Goal: Respiratory complications will improve Outcome: Progressing   Problem: Nutrition: Goal: Adequate nutrition will be maintained Outcome: Progressing   Problem: Coping: Goal: Level of anxiety will decrease Outcome: Progressing   
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
## 2018-07-29 LAB — BASIC METABOLIC PANEL
Anion gap: 4 — ABNORMAL LOW (ref 5–15)
BUN: 36 mg/dL — ABNORMAL HIGH (ref 6–20)
CO2: 24 mmol/L (ref 22–32)
Calcium: 7.8 mg/dL — ABNORMAL LOW (ref 8.9–10.3)
Chloride: 125 mmol/L — ABNORMAL HIGH (ref 98–111)
Creatinine, Ser: 0.48 mg/dL — ABNORMAL LOW (ref 0.61–1.24)
GFR calc Af Amer: >60 mL/min (ref >60)
GFR calc non Af Amer: >60 mL/min (ref >60)
Glucose, Bld: 173 mg/dL — ABNORMAL HIGH (ref 70–99)
Potassium: 3.3 mmol/L — ABNORMAL LOW (ref 3.5–5.1)
Sodium: 153 mmol/L — ABNORMAL HIGH (ref 135–145)

## 2018-07-29 LAB — GLUCOSE, CAPILLARY
Glucose-Capillary: 146 mg/dL — ABNORMAL HIGH (ref 70–99)
Glucose-Capillary: 146 mg/dL — ABNORMAL HIGH (ref 70–99)
Glucose-Capillary: 138 mg/dL — ABNORMAL HIGH (ref 70–99)
Glucose-Capillary: 162 mg/dL — ABNORMAL HIGH (ref 70–99)
Glucose-Capillary: 149 mg/dL — ABNORMAL HIGH (ref 70–99)
Glucose-Capillary: 202 mg/dL — ABNORMAL HIGH (ref 70–99)

## 2018-07-29 MED ORDER — POTASSIUM CHLORIDE 10 MEQ/100ML IV SOLN
10.0000 meq | INTRAVENOUS | Status: AC
  Administered 2018-07-29 (×4): 10 meq via INTRAVENOUS
  Filled 2018-07-29 (×4): qty 100

## 2018-07-29 MED ORDER — FREE WATER
300.0000 mL | Freq: Three times a day (TID) | Status: DC
  Administered 2018-07-29 – 2018-07-30 (×3): 300 mL
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
## 2018-07-29 NOTE — Plan of Care (Signed)
  Problem: Clinical Measurements: Goal: Respiratory complications will improve Outcome: Progressing   Problem: Activity: Goal: Risk for activity intolerance will decrease Outcome: Not Progressing Note:  Patient not following commands or moving to painful stimuli

## 2018-07-29 NOTE — Progress Notes (Signed)
Cooling blanket placed under patient due to elevated temperatures. Blanket temps titrated from 23 degrees celsius to 26 degrees celsius with good success.

## 2018-07-30 ENCOUNTER — Inpatient Hospital Stay (HOSPITAL_COMMUNITY): Payer: No Typology Code available for payment source

## 2018-07-30 ENCOUNTER — Encounter (HOSPITAL_COMMUNITY)
Admission: EM | Disposition: A | Discharge status: Nursing Home (Includes ICF) | Payer: Self-pay | Source: Home / Self Care

## 2018-07-30 ENCOUNTER — Encounter (HOSPITAL_COMMUNITY): Payer: Self-pay | Admitting: Certified Registered"

## 2018-07-30 LAB — CBC WITH DIFFERENTIAL/PLATELET
Abs Immature Granulocytes: 0.1 10*3/uL — ABNORMAL HIGH (ref 0.00–0.07)
Basophils Absolute: 0 10*3/uL (ref 0.0–0.1)
Basophils Relative: 0 %
Eosinophils Absolute: 0.2 10*3/uL (ref 0.0–0.5)
Eosinophils Relative: 2 %
HCT: 28.5 % — ABNORMAL LOW (ref 39.0–52.0)
Hemoglobin: 8.1 g/dL — ABNORMAL LOW (ref 13.0–17.0)
Lymphocytes Relative: 15 %
Lymphs Abs: 1.6 10*3/uL (ref 0.7–4.0)
MCH: 29.2 pg (ref 26.0–34.0)
MCHC: 28.4 g/dL — ABNORMAL LOW (ref 30.0–36.0)
MCV: 102.9 fL — ABNORMAL HIGH (ref 80.0–100.0)
Monocytes Absolute: 0.1 10*3/uL (ref 0.1–1.0)
Monocytes Relative: 1 %
Neutro Abs: 8.6 10*3/uL — ABNORMAL HIGH (ref 1.7–7.7)
Neutrophils Relative %: 81 %
Platelets: 538 10*3/uL — ABNORMAL HIGH (ref 150–400)
Promyelocytes Relative: 1 %
RBC: 2.77 MIL/uL — ABNORMAL LOW (ref 4.22–5.81)
RDW: 20.8 % — ABNORMAL HIGH (ref 11.5–15.5)
WBC: 10.6 10*3/uL — ABNORMAL HIGH (ref 4.0–10.5)
nRBC: 1 % — ABNORMAL HIGH (ref 0.0–0.2)
nRBC: 1 /100 WBC — ABNORMAL HIGH

## 2018-07-30 LAB — GLUCOSE, CAPILLARY
Glucose-Capillary: 149 mg/dL — ABNORMAL HIGH (ref 70–99)
Glucose-Capillary: 159 mg/dL — ABNORMAL HIGH (ref 70–99)
Glucose-Capillary: 97 mg/dL (ref 70–99)
Glucose-Capillary: 93 mg/dL (ref 70–99)
Glucose-Capillary: 155 mg/dL — ABNORMAL HIGH (ref 70–99)
Glucose-Capillary: 142 mg/dL — ABNORMAL HIGH (ref 70–99)

## 2018-07-30 LAB — BASIC METABOLIC PANEL
Anion gap: 5 (ref 5–15)
BUN: 35 mg/dL — ABNORMAL HIGH (ref 6–20)
CO2: 26 mmol/L (ref 22–32)
Calcium: 8.5 mg/dL — ABNORMAL LOW (ref 8.9–10.3)
Chloride: 123 mmol/L — ABNORMAL HIGH (ref 98–111)
Creatinine, Ser: 0.45 mg/dL — ABNORMAL LOW (ref 0.61–1.24)
GFR calc Af Amer: >60 mL/min (ref >60)
GFR calc non Af Amer: >60 mL/min (ref >60)
Glucose, Bld: 162 mg/dL — ABNORMAL HIGH (ref 70–99)
Potassium: 3.7 mmol/L (ref 3.5–5.1)
Sodium: 154 mmol/L — ABNORMAL HIGH (ref 135–145)

## 2018-07-30 SURGERY — IRRIGATION AND DEBRIDEMENT EXTREMITY
Anesthesia: General | Site: Leg Upper | Laterality: Left

## 2018-07-30 MED ORDER — PHENYLEPHRINE HCL-NACL 10-0.9 MG/250ML-% IV SOLN
INTRAVENOUS | Status: AC
  Administered 2018-07-30: 20 ug/min via INTRAVENOUS
  Filled 2018-07-30: qty 250

## 2018-07-30 MED ORDER — MIDAZOLAM HCL 2 MG/2ML IJ SOLN
INTRAMUSCULAR | Status: AC
  Filled 2018-07-30: qty 2

## 2018-07-30 MED ORDER — ROCURONIUM BROMIDE 50 MG/5ML IV SOSY
PREFILLED_SYRINGE | INTRAVENOUS | Status: AC
  Filled 2018-07-30: qty 5

## 2018-07-30 MED ORDER — PHENYLEPHRINE 40 MCG/ML (10ML) SYRINGE FOR IV PUSH (FOR BLOOD PRESSURE SUPPORT)
PREFILLED_SYRINGE | INTRAVENOUS | Status: AC
  Filled 2018-07-30: qty 10

## 2018-07-30 MED ORDER — ALBUMIN HUMAN 5 % IV SOLN
INTRAVENOUS | Status: AC
  Administered 2018-07-30: 16:00:00
  Filled 2018-07-30: qty 500

## 2018-07-30 MED ORDER — SODIUM CHLORIDE 0.9 % IV SOLN
INTRAVENOUS | Status: AC
  Filled 2018-07-30: qty 500000

## 2018-07-30 MED ORDER — METOPROLOL TARTRATE 25 MG/10 ML ORAL SUSPENSION
12.5000 mg | Freq: Two times a day (BID) | ORAL | Status: DC
  Administered 2018-07-30 – 2018-08-01 (×4): 12.5 mg
  Filled 2018-07-30 (×6): qty 10

## 2018-07-30 MED ORDER — FREE WATER
300.0000 mL | Freq: Four times a day (QID) | Status: DC
  Administered 2018-07-30 – 2018-08-03 (×12): 300 mL

## 2018-07-30 MED ORDER — PHENYLEPHRINE HCL-NACL 10-0.9 MG/250ML-% IV SOLN
0.0000 ug/min | INTRAVENOUS | Status: DC
  Administered 2018-07-30: 30 ug/min via INTRAVENOUS
  Administered 2018-07-30: 20 ug/min via INTRAVENOUS
  Administered 2018-07-30: 40 ug/min via INTRAVENOUS
  Administered 2018-07-31: 30 ug/min via INTRAVENOUS
  Administered 2018-07-31 (×2): 40 ug/min via INTRAVENOUS
  Administered 2018-08-01 (×2): 30 ug/min via INTRAVENOUS
  Administered 2018-08-01: 40 ug/min via INTRAVENOUS
  Administered 2018-08-01 – 2018-08-02 (×2): 30 ug/min via INTRAVENOUS
  Administered 2018-08-02: 50 ug/min via INTRAVENOUS
  Administered 2018-08-02: 30 ug/min via INTRAVENOUS
  Administered 2018-08-02: 60 ug/min via INTRAVENOUS
  Administered 2018-08-02: 50 ug/min via INTRAVENOUS
  Administered 2018-08-03: 20 ug/min via INTRAVENOUS
  Administered 2018-08-06: 60 ug/min via INTRAVENOUS
  Administered 2018-08-06: 40 ug/min via INTRAVENOUS
  Administered 2018-08-06: 70 ug/min via INTRAVENOUS
  Administered 2018-08-07: 45 ug/min via INTRAVENOUS
  Administered 2018-08-07: 50 ug/min via INTRAVENOUS
  Administered 2018-08-07: 55 ug/min via INTRAVENOUS
  Administered 2018-08-07: 35 ug/min via INTRAVENOUS
  Administered 2018-08-07: 50 ug/min via INTRAVENOUS
  Administered 2018-08-07: 55 ug/min via INTRAVENOUS
  Administered 2018-08-08 (×2): 50 ug/min via INTRAVENOUS
  Administered 2018-08-08: 20 ug/min via INTRAVENOUS
  Administered 2018-08-08 – 2018-08-10 (×6): 30 ug/min via INTRAVENOUS
  Administered 2018-08-10: 35 ug/min via INTRAVENOUS
  Administered 2018-08-10 (×2): 400 ug/min via INTRAVENOUS
  Administered 2018-08-11: 30 ug/min via INTRAVENOUS
  Administered 2018-08-11: 35 ug/min via INTRAVENOUS
  Administered 2018-08-11: 25 ug/min via INTRAVENOUS
  Administered 2018-08-12: 35 ug/min via INTRAVENOUS
  Administered 2018-08-12: 30 ug/min via INTRAVENOUS
  Administered 2018-08-12 (×2): 35 ug/min via INTRAVENOUS
  Administered 2018-08-12: 13.333 ug/min via INTRAVENOUS
  Administered 2018-08-13: 100 ug/min via INTRAVENOUS
  Administered 2018-08-13 (×2): 70 ug/min via INTRAVENOUS
  Administered 2018-08-13: 60 ug/min via INTRAVENOUS
  Administered 2018-08-13: 15:00:00 via INTRAVENOUS
  Administered 2018-08-13: 62 ug/min via INTRAVENOUS
  Administered 2018-08-13: 50 ug/min via INTRAVENOUS
  Administered 2018-08-14 (×2): 125 ug/min via INTRAVENOUS
  Administered 2018-08-14 (×2): 150 ug/min via INTRAVENOUS
  Filled 2018-07-30 (×19): qty 250
  Filled 2018-07-30: qty 500
  Filled 2018-07-30 (×18): qty 250
  Filled 2018-07-30: qty 500
  Filled 2018-07-30: qty 250
  Filled 2018-07-30: qty 500
  Filled 2018-07-30: qty 250
  Filled 2018-07-30: qty 500
  Filled 2018-07-30 (×9): qty 250

## 2018-07-30 MED ORDER — ALBUMIN HUMAN 5 % IV SOLN
25.0000 g | Freq: Once | INTRAVENOUS | Status: AC
  Administered 2018-07-30: 25 g via INTRAVENOUS

## 2018-07-30 MED ORDER — FENTANYL CITRATE (PF) 250 MCG/5ML IJ SOLN
INTRAMUSCULAR | Status: AC
  Filled 2018-07-30: qty 5

## 2018-07-30 MED ORDER — LIDOCAINE 2% (20 MG/ML) 5 ML SYRINGE
INTRAMUSCULAR | Status: AC
  Filled 2018-07-30: qty 5

## 2018-07-30 NOTE — Progress Notes (Addendum)
I&D scheduled for today was cancelled due to previous apnea episode and hypotension.
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
## 2018-07-30 NOTE — Progress Notes (Signed)
Patient had increasing hypotensive episodes, Christopher [name] paged and made aware. Initially albumin was administered then neosynephrine.
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
## 2018-07-30 NOTE — H&P (View-Only) (Signed)
The patient was seen during the preoperative period with Dr. Thompson.  The patient was having dips in his blood pressure and issues with weaning from the vent.  For the patient's safety the decision was made to hold off on going to the OR.  We will look at times available later this week and early next week to return to the OR. Call placed to girlfriend. 

## 2018-07-30 NOTE — Progress Notes (Signed)
Patient ID: Christopher [name], male   DOB: 1966/01/03, 53 y.o.   MRN: [id number]  Christopher [name] was weaning on the ventilator when he had a desaturation episode with bradycardia. He was noted to have T wave changes at this time. He was bagged and placed back on the ventilator. Sats improved and the T wave changes resolved. 12 lead EKG now is WNL. Will leave on ventilator without weaning for now as going to the OR later today.  Violeta Gelinas, MD, MPH, FACS Trauma: [phone] General Surgery: [phone]

## 2018-07-30 NOTE — Progress Notes (Signed)
Patient placed back on charted vent settings due to Apnea and bradycardia. No current respiratory issues.
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
## 2018-07-30 NOTE — Progress Notes (Signed)
The patient was seen during the preoperative period with Dr. Janee Morn.  The patient was having dips in his blood pressure and issues with weaning from the vent.  For the patient's safety the decision was made to hold off on going to the OR.  We will look at times available later this week and early next week to return to the OR. Call placed to girlfriend.

## 2018-07-31 LAB — GLUCOSE, CAPILLARY
Glucose-Capillary: 151 mg/dL — ABNORMAL HIGH (ref 70–99)
Glucose-Capillary: 153 mg/dL — ABNORMAL HIGH (ref 70–99)
Glucose-Capillary: 131 mg/dL — ABNORMAL HIGH (ref 70–99)
Glucose-Capillary: 127 mg/dL — ABNORMAL HIGH (ref 70–99)
Glucose-Capillary: 112 mg/dL — ABNORMAL HIGH (ref 70–99)
Glucose-Capillary: 117 mg/dL — ABNORMAL HIGH (ref 70–99)

## 2018-07-31 LAB — BASIC METABOLIC PANEL
Anion gap: 5 (ref 5–15)
BUN: 31 mg/dL — ABNORMAL HIGH (ref 6–20)
CO2: 24 mmol/L (ref 22–32)
Calcium: 8.4 mg/dL — ABNORMAL LOW (ref 8.9–10.3)
Chloride: 122 mmol/L — ABNORMAL HIGH (ref 98–111)
Creatinine, Ser: 0.46 mg/dL — ABNORMAL LOW (ref 0.61–1.24)
GFR calc Af Amer: >60 mL/min (ref >60)
GFR calc non Af Amer: >60 mL/min (ref >60)
Glucose, Bld: 176 mg/dL — ABNORMAL HIGH (ref 70–99)
Potassium: 3.5 mmol/L (ref 3.5–5.1)
Sodium: 151 mmol/L — ABNORMAL HIGH (ref 135–145)

## 2018-07-31 LAB — CBC
HCT: 26.5 % — ABNORMAL LOW (ref 39.0–52.0)
Hemoglobin: 7.6 g/dL — ABNORMAL LOW (ref 13.0–17.0)
MCH: 29.6 pg (ref 26.0–34.0)
MCHC: 28.7 g/dL — ABNORMAL LOW (ref 30.0–36.0)
MCV: 103.1 fL — ABNORMAL HIGH (ref 80.0–100.0)
Platelets: 519 10*3/uL — ABNORMAL HIGH (ref 150–400)
RBC: 2.57 MIL/uL — ABNORMAL LOW (ref 4.22–5.81)
RDW: 21 % — ABNORMAL HIGH (ref 11.5–15.5)
WBC: 9.9 10*3/uL (ref 4.0–10.5)
nRBC: 0.4 % — ABNORMAL HIGH (ref 0.0–0.2)

## 2018-07-31 MED ORDER — ALBUMIN HUMAN 5 % IV SOLN
25.0000 g | Freq: Once | INTRAVENOUS | Status: AC
  Administered 2018-07-31: 25 g via INTRAVENOUS
  Filled 2018-07-31: qty 500

## 2018-07-31 NOTE — Progress Notes (Signed)
Clinical update sent to Laretta Bolster, [facility] Case Manager, via fax.  Per WC Case Manager, fiance has repeatedly asked her about why pt still has to wear cervical collar, and if pt needs a different type of bed (like an air bed?) Could you please address these issues with fiance?  If unable, please address in notes and I will follow up with [facility] Case Manager.   Thank you!  Quintella Baton, RN, BSN  Trauma/Neuro ICU Case Manager [phone]

## 2018-07-31 NOTE — Consult Note (Signed)
WOC Nurse ostomy follow up Stoma type/location: LUQ colostomy Stomal assessment/size: Slightly oval shaped. Budded, moist, dark pink, sutures intact, slightly less than 1 3/4 inches side to side, 1.5 inches top to bottom. Peristomal assessment: completely intact Treatment options for stomal/peristomal skin: barrier ring Output:  Small amount light brown, thin Ostomy pouching: 2pc. Flat with barrier ring. Extra supplies at bedside. Education provided: None. Patient sedated, intubated, no family present. Enrolled patient in [facility] Secure Start Discharge program: No Helmut Muster, RN, MSN, [facility], CNS-BC, pager [phone]
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
## 2018-07-31 NOTE — Consult Note (Signed)
WOC Nurse ostomy follow up To patient's room 4N18.  No ostomy supplies in room or clean utility.  Five of each of the following ordered by Unit Secretary:  Barrier rings [place] # [phone]), Skin barrier [place] #2), and Pouch [place] # [phone]).  Will return upon arrival of supplies for ostomy care. Helmut Muster, RN, MSN, CWOCN, CNS-BC, pager [phone]

## 2018-08-01 ENCOUNTER — Inpatient Hospital Stay (HOSPITAL_COMMUNITY): Payer: No Typology Code available for payment source

## 2018-08-01 ENCOUNTER — Encounter (HOSPITAL_COMMUNITY): Payer: Self-pay | Admitting: Plastic Surgery

## 2018-08-01 LAB — CBC
HCT: 24.6 % — ABNORMAL LOW (ref 39.0–52.0)
Hemoglobin: 6.9 g/dL — CL (ref 13.0–17.0)
MCH: 29.5 pg (ref 26.0–34.0)
MCHC: 28 g/dL — ABNORMAL LOW (ref 30.0–36.0)
MCV: 105.1 fL — ABNORMAL HIGH (ref 80.0–100.0)
Platelets: 487 10*3/uL — ABNORMAL HIGH (ref 150–400)
RBC: 2.34 MIL/uL — ABNORMAL LOW (ref 4.22–5.81)
RDW: 21 % — ABNORMAL HIGH (ref 11.5–15.5)
WBC: 10.2 10*3/uL (ref 4.0–10.5)
nRBC: 0.5 % — ABNORMAL HIGH (ref 0.0–0.2)

## 2018-08-01 LAB — BASIC METABOLIC PANEL
Anion gap: 5 (ref 5–15)
BUN: 27 mg/dL — ABNORMAL HIGH (ref 6–20)
CO2: 25 mmol/L (ref 22–32)
Calcium: 8.7 mg/dL — ABNORMAL LOW (ref 8.9–10.3)
Chloride: 121 mmol/L — ABNORMAL HIGH (ref 98–111)
Creatinine, Ser: 0.33 mg/dL — ABNORMAL LOW (ref 0.61–1.24)
GFR calc Af Amer: >60 mL/min (ref >60)
GFR calc non Af Amer: >60 mL/min (ref >60)
Glucose, Bld: 119 mg/dL — ABNORMAL HIGH (ref 70–99)
Potassium: 3.3 mmol/L — ABNORMAL LOW (ref 3.5–5.1)
Sodium: 151 mmol/L — ABNORMAL HIGH (ref 135–145)

## 2018-08-01 LAB — CULTURE, RESPIRATORY W GRAM STAIN: Special Requests: NORMAL

## 2018-08-01 LAB — GLUCOSE, CAPILLARY
Glucose-Capillary: 104 mg/dL — ABNORMAL HIGH (ref 70–99)
Glucose-Capillary: 116 mg/dL — ABNORMAL HIGH (ref 70–99)
Glucose-Capillary: 123 mg/dL — ABNORMAL HIGH (ref 70–99)
Glucose-Capillary: 125 mg/dL — ABNORMAL HIGH (ref 70–99)
Glucose-Capillary: 147 mg/dL — ABNORMAL HIGH (ref 70–99)
Glucose-Capillary: 168 mg/dL — ABNORMAL HIGH (ref 70–99)

## 2018-08-01 LAB — PREPARE RBC (CROSSMATCH)

## 2018-08-01 MED ORDER — PIPERACILLIN-TAZOBACTAM 3.375 G IVPB
3.3750 g | Freq: Three times a day (TID) | INTRAVENOUS | Status: DC
  Administered 2018-08-01: 3.375 g via INTRAVENOUS
  Filled 2018-08-01: qty 50

## 2018-08-01 MED ORDER — HYDROMORPHONE HCL 1 MG/ML IJ SOLN
1.0000 mg | INTRAMUSCULAR | Status: DC | PRN
  Administered 2018-08-01 – 2018-08-31 (×67): 2 mg via INTRAVENOUS
  Administered 2018-08-31 (×2): 1 mg via INTRAVENOUS
  Administered 2018-09-01 – 2018-09-03 (×8): 2 mg via INTRAVENOUS
  Administered 2018-09-04: 1 mg via INTRAVENOUS
  Administered 2018-09-04: 2 mg via INTRAVENOUS
  Administered 2018-09-04: 1 mg via INTRAVENOUS
  Administered 2018-09-05 – 2018-09-09 (×12): 2 mg via INTRAVENOUS
  Administered 2018-09-10 (×2): 1 mg via INTRAVENOUS
  Administered 2018-09-10 – 2018-09-12 (×7): 2 mg via INTRAVENOUS
  Administered 2018-09-13: 1 mg via INTRAVENOUS
  Administered 2018-09-13 – 2018-09-21 (×30): 2 mg via INTRAVENOUS
  Administered 2018-09-21: 1 mg via INTRAVENOUS
  Administered 2018-09-21 – 2018-09-22 (×6): 2 mg via INTRAVENOUS
  Administered 2018-09-23: 1 mg via INTRAVENOUS
  Administered 2018-09-23 – 2018-09-24 (×2): 2 mg via INTRAVENOUS
  Administered 2018-09-24: 1 mg via INTRAVENOUS
  Administered 2018-09-25 (×2): 2 mg via INTRAVENOUS
  Administered 2018-09-26: 1 mg via INTRAVENOUS
  Administered 2018-09-26 – 2018-09-27 (×6): 2 mg via INTRAVENOUS
  Administered 2018-09-27: 1 mg via INTRAVENOUS
  Administered 2018-09-28 – 2018-10-02 (×10): 2 mg via INTRAVENOUS
  Administered 2018-10-02 – 2018-10-03 (×3): 1 mg via INTRAVENOUS
  Administered 2018-10-03 – 2018-10-07 (×15): 2 mg via INTRAVENOUS
  Administered 2018-10-07 (×2): 1 mg via INTRAVENOUS
  Administered 2018-10-08 – 2018-10-10 (×9): 2 mg via INTRAVENOUS
  Administered 2018-10-10: 1 mg via INTRAVENOUS
  Administered 2018-10-11: 2 mg via INTRAVENOUS
  Administered 2018-10-11 – 2018-10-12 (×2): 1 mg via INTRAVENOUS
  Administered 2018-10-12 – 2018-10-15 (×10): 2 mg via INTRAVENOUS
  Administered 2018-10-16: 1 mg via INTRAVENOUS
  Administered 2018-10-16: 2 mg via INTRAVENOUS
  Filled 2018-08-01 (×29): qty 2
  Filled 2018-08-01: qty 1
  Filled 2018-08-01 (×8): qty 2
  Filled 2018-08-01: qty 1
  Filled 2018-08-01 (×5): qty 2
  Filled 2018-08-01: qty 1
  Filled 2018-08-01 (×11): qty 2
  Filled 2018-08-01: qty 1
  Filled 2018-08-01 (×5): qty 2
  Filled 2018-08-01: qty 1
  Filled 2018-08-01 (×11): qty 2
  Filled 2018-08-01: qty 1
  Filled 2018-08-01 (×29): qty 2
  Filled 2018-08-01: qty 1
  Filled 2018-08-01 (×20): qty 2
  Filled 2018-08-01: qty 1
  Filled 2018-08-01 (×4): qty 2
  Filled 2018-08-01: qty 1
  Filled 2018-08-01 (×6): qty 2
  Filled 2018-08-01: qty 1
  Filled 2018-08-01 (×33): qty 2
  Filled 2018-08-01: qty 1
  Filled 2018-08-01 (×3): qty 2
  Filled 2018-08-01: qty 1
  Filled 2018-08-01 (×7): qty 2
  Filled 2018-08-01: qty 1
  Filled 2018-08-01 (×2): qty 2
  Filled 2018-08-01: qty 1
  Filled 2018-08-01: qty 2
  Filled 2018-08-01 (×3): qty 1
  Filled 2018-08-01 (×23): qty 2

## 2018-08-01 MED ORDER — SODIUM CHLORIDE 0.9% IV SOLUTION: Freq: Once | INTRAVENOUS | Status: DC

## 2018-08-01 MED ORDER — SODIUM CHLORIDE 0.9 % IV SOLN
3.0000 g | Freq: Four times a day (QID) | INTRAVENOUS | Status: AC
  Administered 2018-08-01 – 2018-08-08 (×29): 3 g via INTRAVENOUS
  Filled 2018-08-01 (×29): qty 3

## 2018-08-01 MED ORDER — POTASSIUM CHLORIDE 20 MEQ/15ML (10%) PO SOLN
40.0000 meq | Freq: Once | ORAL | Status: AC
  Administered 2018-08-01: 40 meq
  Filled 2018-08-01: qty 30

## 2018-08-01 MED ORDER — OXYCODONE HCL 5 MG/5ML PO SOLN
10.0000 mg | ORAL | Status: DC | PRN
  Administered 2018-08-01 – 2018-08-24 (×29): 10 mg
  Filled 2018-08-01 (×30): qty 10

## 2018-08-01 MED ORDER — ALBUMIN HUMAN 5 % IV SOLN
12.5000 g | Freq: Once | INTRAVENOUS | Status: AC
  Administered 2018-08-01: 12.5 g via INTRAVENOUS
  Filled 2018-08-01: qty 250
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
## 2018-08-01 NOTE — Progress Notes (Signed)
Wound care done to abdomen, groin, scrotum, back and buttock. A Cell covered in lubricant then W/D gauze then ABD. Per order.

## 2018-08-01 NOTE — Anesthesia Postprocedure Evaluation (Signed)
Anesthesia Post Note  Patient: Sathvik Giza Hoog  Procedure(s) Performed: Debridement of buttock, scrotum and left leg, placement of acell and vac (Left Buttocks)     Patient location during evaluation: SICU Anesthesia Type: General Level of consciousness: sedated Pain management: pain level controlled Vital Signs Assessment: post-procedure vital signs reviewed and stable Respiratory status: patient remains intubated per anesthesia plan Cardiovascular status: stable Postop Assessment: no apparent nausea or vomiting Anesthetic complications: no    Last Vitals:  Vitals:   08/01/18 1500 08/01/18 1524  BP: 102/60 102/60  Pulse: 95 95  Resp: (!) 23 (!) 25  Temp: (!) 38.2 C   SpO2: 95% 95%    Last Pain:  Vitals:   07/31/18 0400  TempSrc: Esophageal  PainSc:                  Trevor Iha

## 2018-08-01 NOTE — Procedures (Signed)
Cortrak  Person Inserting Tube:  Mahala Menghini, RD Tube Type:  Cortrak - 55 inches Tube Location:  Right nare Initial Placement:  Stomach Secured by: Bridle Technique Used to Measure Tube Placement:  Documented cm marking at nare/ corner of mouth Cortrak Secured At:  65 cm    Cortrak Tube Team Note:  Consult received to place a Cortrak feeding tube.   X-ray is required, abdominal x-ray has been ordered by the Cortrak team. Please confirm tube placement before using the Cortrak tube.   If the tube becomes dislodged please keep the tube and contact the Cortrak team at [email or web address] ([email or web address]) for replacement.  If after hours and replacement cannot be delayed, place a NG tube and confirm placement with an abdominal x-ray.    Earma Reading, MS, RD, LDN Inpatient Clinical Dietitian Pager: [phone] Weekend/After Hours: [phone]

## 2018-08-01 NOTE — Transfer of Care (Signed)
Immediate Anesthesia Transfer of Care Note  Patient: Christopher [name]  Procedure(s) Performed: Debridement of buttock, scrotum and left leg, placement of acell and vac (Left Buttocks)  Patient Location: ICU  Anesthesia Type:General  Level of Consciousness: sedated  Airway & Oxygen Therapy: Patient remains intubated per anesthesia plan and Patient placed on Ventilator (see vital sign flow sheet for setting)  Post-op Assessment: Report given to RN and Post -op Vital signs reviewed and stable  Post vital signs: Reviewed and stable  Last Vitals:  Vitals Value Taken Time  BP    Temp 36 C 08/01/2018 10:45 AM  Pulse 92 08/01/2018 10:45 AM  Resp 32 08/01/2018 10:45 AM  SpO2 97 % 08/01/2018 10:45 AM  Vitals shown include unvalidated device data.  Last Pain:  Vitals:   07/31/18 0400  TempSrc: Esophageal  PainSc:          Complications: No apparent anesthesia complications

## 2018-08-01 NOTE — Progress Notes (Signed)
Patient ID: Christopher [name], male   DOB: Dec 31, 1965, 53 y.o.   MRN: [id number] I called his fiancee and answered her questions about his collar. I also discussed the planned tracheostomy tomorrow including the procedure, risks, and benefits. She agrees and will sign the consent tonight when she comes in.  Violeta Gelinas, MD, MPH, FACS Trauma: [phone] General Surgery: [phone]

## 2018-08-02 ENCOUNTER — Inpatient Hospital Stay (HOSPITAL_COMMUNITY): Payer: No Typology Code available for payment source

## 2018-08-02 ENCOUNTER — Encounter (HOSPITAL_COMMUNITY): Payer: Self-pay | Admitting: Anesthesiology

## 2018-08-02 ENCOUNTER — Encounter (HOSPITAL_COMMUNITY)
Admission: EM | Disposition: A | Discharge status: Nursing Home (Includes ICF) | Payer: Self-pay | Source: Home / Self Care

## 2018-08-02 HISTORY — PX: PERCUTANEOUS TRACHEOSTOMY: SHX5288

## 2018-08-02 LAB — BASIC METABOLIC PANEL
Anion gap: 8 (ref 5–15)
BUN: 31 mg/dL — ABNORMAL HIGH (ref 6–20)
CO2: 23 mmol/L (ref 22–32)
Calcium: 8.5 mg/dL — ABNORMAL LOW (ref 8.9–10.3)
Chloride: 120 mmol/L — ABNORMAL HIGH (ref 98–111)
Creatinine, Ser: 0.58 mg/dL — ABNORMAL LOW (ref 0.61–1.24)
GFR calc Af Amer: >60 mL/min (ref >60)
GFR calc non Af Amer: >60 mL/min (ref >60)
Glucose, Bld: 125 mg/dL — ABNORMAL HIGH (ref 70–99)
Potassium: 3.5 mmol/L (ref 3.5–5.1)
Sodium: 151 mmol/L — ABNORMAL HIGH (ref 135–145)

## 2018-08-02 LAB — CBC
HCT: 25.2 % — ABNORMAL LOW (ref 39.0–52.0)
Hemoglobin: 7.1 g/dL — ABNORMAL LOW (ref 13.0–17.0)
MCH: 29.3 pg (ref 26.0–34.0)
MCHC: 28.2 g/dL — ABNORMAL LOW (ref 30.0–36.0)
MCV: 104.1 fL — ABNORMAL HIGH (ref 80.0–100.0)
Platelets: 415 10*3/uL — ABNORMAL HIGH (ref 150–400)
RBC: 2.42 MIL/uL — ABNORMAL LOW (ref 4.22–5.81)
RDW: 21.3 % — ABNORMAL HIGH (ref 11.5–15.5)
WBC: 9.7 10*3/uL (ref 4.0–10.5)
nRBC: 0.3 % — ABNORMAL HIGH (ref 0.0–0.2)

## 2018-08-02 LAB — GLUCOSE, CAPILLARY
Glucose-Capillary: 123 mg/dL — ABNORMAL HIGH (ref 70–99)
Glucose-Capillary: 111 mg/dL — ABNORMAL HIGH (ref 70–99)
Glucose-Capillary: 96 mg/dL (ref 70–99)
Glucose-Capillary: 100 mg/dL — ABNORMAL HIGH (ref 70–99)
Glucose-Capillary: 141 mg/dL — ABNORMAL HIGH (ref 70–99)
Glucose-Capillary: 136 mg/dL — ABNORMAL HIGH (ref 70–99)

## 2018-08-02 SURGERY — CREATION, TRACHEOSTOMY, PERCUTANEOUS
Anesthesia: General | Site: Neck

## 2018-08-02 MED ORDER — CHLORHEXIDINE GLUCONATE CLOTH 2 % EX PADS
6.0000 | MEDICATED_PAD | Freq: Once | CUTANEOUS | Status: AC
  Administered 2018-08-02: 6 via TOPICAL

## 2018-08-02 MED ORDER — METOPROLOL TARTRATE 5 MG/5ML IV SOLN
5.0000 mg | INTRAVENOUS | Status: DC | PRN
  Administered 2018-08-06 – 2018-10-07 (×71): 5 mg via INTRAVENOUS
  Filled 2018-08-02 (×74): qty 5

## 2018-08-02 MED ORDER — MIDAZOLAM HCL 2 MG/2ML IJ SOLN
2.0000 mg | Freq: Once | INTRAMUSCULAR | Status: AC
  Administered 2018-08-02: 2 mg via INTRAVENOUS

## 2018-08-02 MED ORDER — MIDAZOLAM HCL 2 MG/2ML IJ SOLN
INTRAMUSCULAR | Status: AC
  Administered 2018-08-02: 2 mg via INTRAVENOUS
  Filled 2018-08-02: qty 2

## 2018-08-02 MED ORDER — VECURONIUM BROMIDE 10 MG IV SOLR
INTRAVENOUS | Status: AC
  Administered 2018-08-02: 10 mg via INTRAVENOUS
  Filled 2018-08-02: qty 10

## 2018-08-02 MED ORDER — VECURONIUM BROMIDE 10 MG IV SOLR
10.0000 mg | Freq: Once | INTRAVENOUS | Status: AC
  Administered 2018-08-02: 10 mg via INTRAVENOUS

## 2018-08-02 MED ORDER — ALBUMIN HUMAN 5 % IV SOLN
12.5000 g | Freq: Once | INTRAVENOUS | Status: AC
  Administered 2018-08-02: 12.5 g via INTRAVENOUS
  Filled 2018-08-02: qty 250

## 2018-08-02 MED ORDER — CHLORHEXIDINE GLUCONATE CLOTH 2 % EX PADS: 6.0000 | MEDICATED_PAD | Freq: Once | CUTANEOUS | Status: DC

## 2018-08-02 MED ORDER — LIDOCAINE-EPINEPHRINE (PF) 1.5 %-1:200000 IJ SOLN
INTRAMUSCULAR | Status: DC | PRN
  Administered 2018-08-02: 4 mL

## 2018-08-02 SURGICAL SUPPLY — 27 items
COVER WAND RF STERILE (DRAPES) ×2 IMPLANT
DRAPE HALF SHEET 40X57 (DRAPES) ×8 IMPLANT
DRAPE UTILITY XL STRL (DRAPES) ×2 IMPLANT
ELECT CAUTERY BLADE 6.4 (BLADE) ×2 IMPLANT
ELECT REM PT RETURN 9FT ADLT (ELECTROSURGICAL) ×2
ELECTRODE REM PT RTRN 9FT ADLT (ELECTROSURGICAL) ×1 IMPLANT
GAUZE 4X4 16PLY RFD (DISPOSABLE) ×2 IMPLANT
GLOVE BIO SURGEON STRL SZ 6 (GLOVE) IMPLANT
GLOVE BIO SURGEON STRL SZ8 (GLOVE) ×2 IMPLANT
GLOVE BIOGEL PI IND STRL 6.5 (GLOVE) IMPLANT
GLOVE BIOGEL PI IND STRL 8 (GLOVE) ×1 IMPLANT
GLOVE BIOGEL PI INDICATOR 6.5 (GLOVE)
GLOVE BIOGEL PI INDICATOR 8 (GLOVE) ×1
GOWN STRL REUS W/ TWL LRG LVL3 (GOWN DISPOSABLE) ×1 IMPLANT
GOWN STRL REUS W/ TWL XL LVL3 (GOWN DISPOSABLE) ×1 IMPLANT
GOWN STRL REUS W/TWL LRG LVL3 (GOWN DISPOSABLE) ×1
GOWN STRL REUS W/TWL XL LVL3 (GOWN DISPOSABLE) ×1
INTRODUCER TRACH BLUE RHINO 6F (TUBING) ×2 IMPLANT
INTRODUCER TRACH BLUE RHINO 8F (TUBING) IMPLANT
PENCIL BUTTON HOLSTER BLD 10FT (ELECTRODE) ×2 IMPLANT
SPONGE DRAIN TRACH 4X4 STRL 2S (GAUZE/BANDAGES/DRESSINGS) ×2 IMPLANT
SPONGE INTESTINAL PEANUT (DISPOSABLE) ×2 IMPLANT
SUT SILK 3 0 SH CR/8 (SUTURE) ×2 IMPLANT
SUT VICRYL AB 3 0 TIES (SUTURE) ×2 IMPLANT
TOWEL OR 17X24 6PK STRL BLUE (TOWEL DISPOSABLE) ×2 IMPLANT
TUBE CONNECTING 12X1/4 (SUCTIONS) ×2 IMPLANT
YANKAUER SUCT BULB TIP NO VENT (SUCTIONS) ×2 IMPLANT
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
## 2018-08-02 NOTE — Op Note (Signed)
07/10/2018 - 08/02/2018  2:49 PM  PATIENT:  Christopher [name]  53 y.o. male  PRE-OPERATIVE DIAGNOSIS:  prolonged intubation  POST-OPERATIVE DIAGNOSIS:  Prolonged intubation  PROCEDURE:  Procedure(s): PERCUTANEOUS TRACHEOSTOMY  SURGEON:  Surgeon(s): Violeta Gelinas, MD  ASSISTANTS: Sammuel Cooper, ARNP-S   ANESTHESIA:   local and IV sedation  EBL:  No intake/output data recorded.  BLOOD ADMINISTERED:none  DRAINS: none   SPECIMEN:  No Specimen  DISPOSITION OF SPECIMEN:  N/A  COUNTS:  YES  DICTATION: .Dragon Dictation Procedure in detail: The patient remained monitored in the trauma neuro ICU throughout the procedure.  He was identified and we did a timeout procedure.  He received intravenous muscle relaxation, sedation, and pain medication.  His anterior cervical collar was removed and his head was immobilized in neutral position.  His anterior neck was prepped and draped in sterile fashion.  Local was injected 2 cm cephalad to the sternal notch.  A transverse incision was made.  Subcutaneous tissues were dissected down revealing the strap muscles.  These were split along the midline.  The anterior trachea was exposed.  The endotracheal tube was withdrawn under direct palpation to just above this area.  The Angiocath was inserted between the second and third tracheal ring.  I then placed a guidewire.  Next I placed the small blue dilator followed by the large Blue Rhino dilator.  I then inserted a #6 Shiley tracheostomy tube over 24 [place] dilator.  It was hooked up to the ventilator circuit and volume returns were confirmed.  Hemostasis was good.  A trach dressing was placed and 2-0 Prolene was used to secure the tracheostomy tube to the skin.  A Velcro trach tie was applied.  He tolerated the procedure well without apparent complication and we will check a chest x-ray. PATIENT DISPOSITION: Remains in the trauma neuro ICU   Delay start of Pharmacological VTE agent (>24hrs) due  to surgical blood loss or risk of bleeding:  no  Violeta Gelinas, MD, MPH, FACS Pager: [phone]  2/20/20202:49 PM

## 2018-08-03 ENCOUNTER — Encounter (HOSPITAL_COMMUNITY): Payer: Self-pay | Admitting: General Surgery

## 2018-08-03 ENCOUNTER — Inpatient Hospital Stay (HOSPITAL_COMMUNITY): Payer: No Typology Code available for payment source

## 2018-08-03 LAB — BASIC METABOLIC PANEL
Anion gap: 5 (ref 5–15)
BUN: 32 mg/dL — ABNORMAL HIGH (ref 6–20)
CO2: 25 mmol/L (ref 22–32)
Calcium: 8.6 mg/dL — ABNORMAL LOW (ref 8.9–10.3)
Chloride: 121 mmol/L — ABNORMAL HIGH (ref 98–111)
Creatinine, Ser: 0.44 mg/dL — ABNORMAL LOW (ref 0.61–1.24)
GFR calc Af Amer: >60 mL/min (ref >60)
GFR calc non Af Amer: >60 mL/min (ref >60)
Glucose, Bld: 162 mg/dL — ABNORMAL HIGH (ref 70–99)
Potassium: 3.2 mmol/L — ABNORMAL LOW (ref 3.5–5.1)
Sodium: 151 mmol/L — ABNORMAL HIGH (ref 135–145)

## 2018-08-03 LAB — CBC
HCT: 24.4 % — ABNORMAL LOW (ref 39.0–52.0)
Hemoglobin: 6.7 g/dL — CL (ref 13.0–17.0)
MCH: 29 pg (ref 26.0–34.0)
MCHC: 27.5 g/dL — ABNORMAL LOW (ref 30.0–36.0)
MCV: 105.6 fL — ABNORMAL HIGH (ref 80.0–100.0)
Platelets: 425 10*3/uL — ABNORMAL HIGH (ref 150–400)
RBC: 2.31 MIL/uL — ABNORMAL LOW (ref 4.22–5.81)
RDW: 21.1 % — ABNORMAL HIGH (ref 11.5–15.5)
WBC: 6.9 10*3/uL (ref 4.0–10.5)
nRBC: 0 % (ref 0.0–0.2)

## 2018-08-03 LAB — GLUCOSE, CAPILLARY
Glucose-Capillary: 123 mg/dL — ABNORMAL HIGH (ref 70–99)
Glucose-Capillary: 141 mg/dL — ABNORMAL HIGH (ref 70–99)
Glucose-Capillary: 142 mg/dL — ABNORMAL HIGH (ref 70–99)
Glucose-Capillary: 145 mg/dL — ABNORMAL HIGH (ref 70–99)
Glucose-Capillary: 150 mg/dL — ABNORMAL HIGH (ref 70–99)
Glucose-Capillary: 175 mg/dL — ABNORMAL HIGH (ref 70–99)

## 2018-08-03 LAB — PREPARE RBC (CROSSMATCH)

## 2018-08-03 MED ORDER — POTASSIUM CHLORIDE 20 MEQ/15ML (10%) PO SOLN
40.0000 meq | Freq: Once | ORAL | Status: AC
  Administered 2018-08-03: 40 meq
  Filled 2018-08-03: qty 30

## 2018-08-03 MED ORDER — FREE WATER
350.0000 mL | Freq: Four times a day (QID) | Status: DC
  Administered 2018-08-03 – 2018-09-12 (×149): 350 mL

## 2018-08-03 MED ORDER — SODIUM CHLORIDE 0.9% IV SOLUTION: Freq: Once | INTRAVENOUS | Status: DC
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
## 2018-08-03 NOTE — Progress Notes (Signed)
Critical HGB of 6.7 this AM.   Dr. Dwain Sarna on call notified.  1 unit PRBC ordered.  Will continue to monitor.

## 2018-08-03 NOTE — Progress Notes (Signed)
Patient started SBT on 10/5. Patient tidal volumes low, in 300s. Pressure support increased to 15. Tidal volumes increased to upper 500s and lower 600s. RT will continue to monitor.

## 2018-08-04 ENCOUNTER — Inpatient Hospital Stay (HOSPITAL_COMMUNITY): Payer: No Typology Code available for payment source

## 2018-08-04 LAB — TYPE AND SCREEN
ABO/RH(D): O POS
Antibody Screen: NEGATIVE
Unit division: 0
Unit division: 0

## 2018-08-04 LAB — GLUCOSE, CAPILLARY
Glucose-Capillary: 152 mg/dL — ABNORMAL HIGH (ref 70–99)
Glucose-Capillary: 144 mg/dL — ABNORMAL HIGH (ref 70–99)
Glucose-Capillary: 149 mg/dL — ABNORMAL HIGH (ref 70–99)
Glucose-Capillary: 107 mg/dL — ABNORMAL HIGH (ref 70–99)
Glucose-Capillary: 147 mg/dL — ABNORMAL HIGH (ref 70–99)
Glucose-Capillary: 156 mg/dL — ABNORMAL HIGH (ref 70–99)

## 2018-08-04 LAB — BASIC METABOLIC PANEL
Anion gap: 6 (ref 5–15)
BUN: 33 mg/dL — ABNORMAL HIGH (ref 6–20)
CO2: 26 mmol/L (ref 22–32)
Calcium: 8.2 mg/dL — ABNORMAL LOW (ref 8.9–10.3)
Chloride: 119 mmol/L — ABNORMAL HIGH (ref 98–111)
Creatinine, Ser: 0.52 mg/dL — ABNORMAL LOW (ref 0.61–1.24)
GFR calc Af Amer: >60 mL/min (ref >60)
GFR calc non Af Amer: >60 mL/min (ref >60)
Glucose, Bld: 182 mg/dL — ABNORMAL HIGH (ref 70–99)
Potassium: 3.7 mmol/L (ref 3.5–5.1)
Sodium: 151 mmol/L — ABNORMAL HIGH (ref 135–145)

## 2018-08-04 LAB — CBC
HCT: 26.7 % — ABNORMAL LOW (ref 39.0–52.0)
Hemoglobin: 7.7 g/dL — ABNORMAL LOW (ref 13.0–17.0)
MCH: 29.7 pg (ref 26.0–34.0)
MCHC: 28.8 g/dL — ABNORMAL LOW (ref 30.0–36.0)
MCV: 103.1 fL — ABNORMAL HIGH (ref 80.0–100.0)
Platelets: 388 10*3/uL (ref 150–400)
RBC: 2.59 MIL/uL — ABNORMAL LOW (ref 4.22–5.81)
RDW: 20.4 % — ABNORMAL HIGH (ref 11.5–15.5)
WBC: 7.3 10*3/uL (ref 4.0–10.5)
nRBC: 0 % (ref 0.0–0.2)

## 2018-08-04 LAB — BPAM RBC
Blood Product Expiration Date: 202002252359
Blood Product Expiration Date: 202003212359
ISSUE DATE / TIME: 202002190917
ISSUE DATE / TIME: 202002210745
Unit Type and Rh: 5100
Unit Type and Rh: 5100

## 2018-08-04 LAB — CULTURE, BLOOD (ROUTINE X 2)
Culture: NO GROWTH
Culture: NO GROWTH
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
## 2018-08-05 ENCOUNTER — Inpatient Hospital Stay (HOSPITAL_COMMUNITY): Payer: No Typology Code available for payment source

## 2018-08-05 LAB — CBC
HCT: 26 % — ABNORMAL LOW (ref 39.0–52.0)
Hemoglobin: 7.2 g/dL — ABNORMAL LOW (ref 13.0–17.0)
MCH: 28.6 pg (ref 26.0–34.0)
MCHC: 27.7 g/dL — ABNORMAL LOW (ref 30.0–36.0)
MCV: 103.2 fL — ABNORMAL HIGH (ref 80.0–100.0)
Platelets: 341 10*3/uL (ref 150–400)
RBC: 2.52 MIL/uL — ABNORMAL LOW (ref 4.22–5.81)
RDW: 19.5 % — ABNORMAL HIGH (ref 11.5–15.5)
WBC: 6.8 10*3/uL (ref 4.0–10.5)
nRBC: 0 % (ref 0.0–0.2)

## 2018-08-05 LAB — COMPREHENSIVE METABOLIC PANEL
ALT: 185 U/L — ABNORMAL HIGH (ref 0–44)
AST: 220 U/L — ABNORMAL HIGH (ref 15–41)
Albumin: 1 g/dL — ABNORMAL LOW (ref 3.5–5.0)
Alkaline Phosphatase: 369 U/L — ABNORMAL HIGH (ref 38–126)
Anion gap: 11 (ref 5–15)
BUN: 30 mg/dL — ABNORMAL HIGH (ref 6–20)
CO2: 23 mmol/L (ref 22–32)
Calcium: 8.1 mg/dL — ABNORMAL LOW (ref 8.9–10.3)
Chloride: 114 mmol/L — ABNORMAL HIGH (ref 98–111)
Creatinine, Ser: 0.39 mg/dL — ABNORMAL LOW (ref 0.61–1.24)
GFR calc Af Amer: >60 mL/min (ref >60)
GFR calc non Af Amer: >60 mL/min (ref >60)
Glucose, Bld: 158 mg/dL — ABNORMAL HIGH (ref 70–99)
Potassium: 3.3 mmol/L — ABNORMAL LOW (ref 3.5–5.1)
Sodium: 148 mmol/L — ABNORMAL HIGH (ref 135–145)
Total Bilirubin: 0.9 mg/dL (ref 0.3–1.2)
Total Protein: 5.2 g/dL — ABNORMAL LOW (ref 6.5–8.1)

## 2018-08-05 LAB — GLUCOSE, CAPILLARY
Glucose-Capillary: 132 mg/dL — ABNORMAL HIGH (ref 70–99)
Glucose-Capillary: 108 mg/dL — ABNORMAL HIGH (ref 70–99)
Glucose-Capillary: 161 mg/dL — ABNORMAL HIGH (ref 70–99)
Glucose-Capillary: 154 mg/dL — ABNORMAL HIGH (ref 70–99)
Glucose-Capillary: 118 mg/dL — ABNORMAL HIGH (ref 70–99)
Glucose-Capillary: 141 mg/dL — ABNORMAL HIGH (ref 70–99)

## 2018-08-05 LAB — PREALBUMIN: Prealbumin: 5.2 mg/dL — ABNORMAL LOW (ref 18–38)

## 2018-08-05 MED ORDER — CHLORHEXIDINE GLUCONATE CLOTH 2 % EX PADS
6.0000 | MEDICATED_PAD | Freq: Every day | CUTANEOUS | Status: DC
  Administered 2018-08-06 – 2018-08-13 (×7): 6 via TOPICAL

## 2018-08-05 MED ORDER — CHLORHEXIDINE GLUCONATE CLOTH 2 % EX PADS
6.0000 | MEDICATED_PAD | Freq: Once | CUTANEOUS | Status: AC
  Administered 2018-08-06: 6 via TOPICAL

## 2018-08-05 MED ORDER — POTASSIUM CHLORIDE 20 MEQ PO PACK
40.0000 meq | PACK | Freq: Every day | ORAL | Status: DC
  Administered 2018-08-05 – 2018-08-06 (×2): 40 meq
  Filled 2018-08-05 (×2): qty 2

## 2018-08-05 MED ORDER — CHLORHEXIDINE GLUCONATE CLOTH 2 % EX PADS: 6.0000 | MEDICATED_PAD | Freq: Once | CUTANEOUS | Status: AC

## 2018-08-05 MED ORDER — CEFAZOLIN SODIUM-DEXTROSE 2-4 GM/100ML-% IV SOLN: 2.0000 g | INTRAVENOUS | Status: DC

## 2018-08-05 MED ORDER — CHLORHEXIDINE GLUCONATE CLOTH 2 % EX PADS
6.0000 | MEDICATED_PAD | Freq: Once | CUTANEOUS | Status: AC
  Administered 2018-08-05: 6 via TOPICAL

## 2018-08-05 MED ORDER — POTASSIUM CHLORIDE CRYS ER 20 MEQ PO TBCR: 40.0000 meq | EXTENDED_RELEASE_TABLET | Freq: Every day | ORAL | Status: DC
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
## 2018-08-06 ENCOUNTER — Encounter (HOSPITAL_COMMUNITY)
Admission: EM | Disposition: A | Discharge status: Nursing Home (Includes ICF) | Payer: Self-pay | Source: Home / Self Care

## 2018-08-06 ENCOUNTER — Encounter (HOSPITAL_COMMUNITY): Payer: Self-pay | Admitting: Anesthesiology

## 2018-08-06 ENCOUNTER — Inpatient Hospital Stay (HOSPITAL_COMMUNITY): Payer: No Typology Code available for payment source | Admitting: Anesthesiology

## 2018-08-06 DIAGNOSIS — S31829A Unspecified open wound of left buttock, initial encounter: Secondary | ICD-10-CM

## 2018-08-06 DIAGNOSIS — S3130XA Unspecified open wound of scrotum and testes, initial encounter: Secondary | ICD-10-CM

## 2018-08-06 DIAGNOSIS — S31819A Unspecified open wound of right buttock, initial encounter: Secondary | ICD-10-CM

## 2018-08-06 DIAGNOSIS — S81802A Unspecified open wound, left lower leg, initial encounter: Secondary | ICD-10-CM

## 2018-08-06 HISTORY — PX: APPLICATION OF A-CELL OF BACK: SHX6301

## 2018-08-06 HISTORY — PX: APPLICATION OF A-CELL OF EXTREMITY: SHX6303

## 2018-08-06 HISTORY — PX: I & D EXTREMITY: SHX5045

## 2018-08-06 LAB — GLUCOSE, CAPILLARY
Glucose-Capillary: 93 mg/dL (ref 70–99)
Glucose-Capillary: 94 mg/dL (ref 70–99)
Glucose-Capillary: 115 mg/dL — ABNORMAL HIGH (ref 70–99)
Glucose-Capillary: 120 mg/dL — ABNORMAL HIGH (ref 70–99)
Glucose-Capillary: 132 mg/dL — ABNORMAL HIGH (ref 70–99)

## 2018-08-06 LAB — CBC
HCT: 23.2 % — ABNORMAL LOW (ref 39.0–52.0)
Hemoglobin: 6.5 g/dL — CL (ref 13.0–17.0)
MCH: 29.4 pg (ref 26.0–34.0)
MCHC: 28 g/dL — ABNORMAL LOW (ref 30.0–36.0)
MCV: 105 fL — ABNORMAL HIGH (ref 80.0–100.0)
Platelets: 297 10*3/uL (ref 150–400)
RBC: 2.21 MIL/uL — ABNORMAL LOW (ref 4.22–5.81)
RDW: 19 % — ABNORMAL HIGH (ref 11.5–15.5)
WBC: 8.9 10*3/uL (ref 4.0–10.5)
nRBC: 0 % (ref 0.0–0.2)

## 2018-08-06 LAB — PREPARE RBC (CROSSMATCH)

## 2018-08-06 SURGERY — IRRIGATION AND DEBRIDEMENT EXTREMITY
Anesthesia: General | Site: Leg Upper

## 2018-08-06 MED ORDER — FENTANYL CITRATE (PF) 250 MCG/5ML IJ SOLN
INTRAMUSCULAR | Status: DC | PRN
  Administered 2018-08-06: 50 ug via INTRAVENOUS
  Administered 2018-08-06 (×2): 100 ug via INTRAVENOUS

## 2018-08-06 MED ORDER — FUROSEMIDE 10 MG/ML IJ SOLN
20.0000 mg | Freq: Once | INTRAMUSCULAR | Status: AC
  Administered 2018-08-06: 20 mg via INTRAVENOUS
  Filled 2018-08-06 (×2): qty 2

## 2018-08-06 MED ORDER — SODIUM CHLORIDE 0.9 % IV SOLN
INTRAVENOUS | Status: DC | PRN
  Administered 2018-08-06: 20 ug/min via INTRAVENOUS

## 2018-08-06 MED ORDER — PHENYLEPHRINE 40 MCG/ML (10ML) SYRINGE FOR IV PUSH (FOR BLOOD PRESSURE SUPPORT)
PREFILLED_SYRINGE | INTRAVENOUS | Status: DC | PRN
  Administered 2018-08-06 (×3): 80 ug via INTRAVENOUS

## 2018-08-06 MED ORDER — MIDAZOLAM HCL 2 MG/2ML IJ SOLN
INTRAMUSCULAR | Status: AC
  Filled 2018-08-06: qty 2

## 2018-08-06 MED ORDER — 0.9 % SODIUM CHLORIDE (POUR BTL) OPTIME
TOPICAL | Status: DC | PRN
  Administered 2018-08-06: 1000 mL

## 2018-08-06 MED ORDER — SODIUM CHLORIDE 0.9 % IV SOLN
INTRAVENOUS | Status: DC | PRN
  Administered 2018-08-06: 500 mL

## 2018-08-06 MED ORDER — ROCURONIUM BROMIDE 50 MG/5ML IV SOSY
PREFILLED_SYRINGE | INTRAVENOUS | Status: DC | PRN
  Administered 2018-08-06: 50 mg via INTRAVENOUS

## 2018-08-06 MED ORDER — PROPOFOL 10 MG/ML IV BOLUS
INTRAVENOUS | Status: AC
  Filled 2018-08-06: qty 20

## 2018-08-06 MED ORDER — FENTANYL CITRATE (PF) 250 MCG/5ML IJ SOLN
INTRAMUSCULAR | Status: AC
  Filled 2018-08-06: qty 5

## 2018-08-06 MED ORDER — MIDAZOLAM HCL 5 MG/5ML IJ SOLN
INTRAMUSCULAR | Status: DC | PRN
  Administered 2018-08-06 (×2): 2 mg via INTRAVENOUS

## 2018-08-06 MED ORDER — SODIUM CHLORIDE 0.9 % IV SOLN
INTRAVENOUS | Status: AC
  Filled 2018-08-06: qty 500000

## 2018-08-06 MED ORDER — LACTATED RINGERS IV SOLN
INTRAVENOUS | Status: DC | PRN
  Administered 2018-08-06: 08:00:00 via INTRAVENOUS

## 2018-08-06 MED ORDER — SODIUM CHLORIDE 0.9% IV SOLUTION
Freq: Once | INTRAVENOUS | Status: AC
  Administered 2018-08-06: 16:00:00 via INTRAVENOUS

## 2018-08-06 SURGICAL SUPPLY — 34 items
BNDG GAUZE ELAST 4 BULKY (GAUZE/BANDAGES/DRESSINGS) ×2 IMPLANT
CANISTER SUCT 3000ML PPV (MISCELLANEOUS) ×3 IMPLANT
CONT SPEC 4OZ CLIKSEAL STRL BL (MISCELLANEOUS) ×1 IMPLANT
COVER SURGICAL LIGHT HANDLE (MISCELLANEOUS) ×4 IMPLANT
DRAPE HALF SHEET 40X57 (DRAPES) ×1 IMPLANT
DRAPE ORTHO SPLIT 77X108 STRL (DRAPES) ×4
DRAPE SURG ORHT 6 SPLT 77X108 (DRAPES) IMPLANT
DRSG CUTIMED SORBACT 7X9 (GAUZE/BANDAGES/DRESSINGS) ×2 IMPLANT
ELECT REM PT RETURN 9FT ADLT (ELECTROSURGICAL) ×3
ELECTRODE REM PT RTRN 9FT ADLT (ELECTROSURGICAL) ×2 IMPLANT
GAUZE SPONGE 4X4 12PLY STRL (GAUZE/BANDAGES/DRESSINGS) ×1 IMPLANT
GLOVE BIO SURGEON STRL SZ 6.5 (GLOVE) ×10 IMPLANT
GLOVE BIO SURGEON STRL SZ7.5 (GLOVE) ×1 IMPLANT
GOWN STRL REUS W/ TWL LRG LVL3 (GOWN DISPOSABLE) ×4 IMPLANT
GOWN STRL REUS W/ TWL XL LVL3 (GOWN DISPOSABLE) IMPLANT
GOWN STRL REUS W/TWL LRG LVL3 (GOWN DISPOSABLE) ×8
GOWN STRL REUS W/TWL XL LVL3 (GOWN DISPOSABLE) ×1
KIT BASIN OR (CUSTOM PROCEDURE TRAY) ×3 IMPLANT
KIT TURNOVER KIT B (KITS) ×3 IMPLANT
MATRIX WOUND 3-LAYER 10X15 (Tissue) ×12 IMPLANT
MICROMATRIX 1000MG (Tissue) ×36 IMPLANT
NS IRRIG 1000ML POUR BTL (IV SOLUTION) ×3 IMPLANT
PACK GENERAL/GYN (CUSTOM PROCEDURE TRAY) ×3 IMPLANT
PAD ABD 8X10 STRL (GAUZE/BANDAGES/DRESSINGS) ×4 IMPLANT
PAD ARMBOARD 7.5X6 YLW CONV (MISCELLANEOUS) ×7 IMPLANT
SOLUTION BETADINE 4OZ (MISCELLANEOUS) ×3 IMPLANT
SOLUTION PARTIC MCRMTRX 1000MG (Tissue) IMPLANT
SPONGE LAP 18X18 RF (DISPOSABLE) ×1 IMPLANT
SURGILUBE 2OZ TUBE FLIPTOP (MISCELLANEOUS) ×10 IMPLANT
SUT VIC AB 5-0 PS2 18 (SUTURE) ×31 IMPLANT
TAPE CLOTH SURG 6X10 WHT LF (GAUZE/BANDAGES/DRESSINGS) ×2 IMPLANT
TOWEL GREEN STERILE (TOWEL DISPOSABLE) ×1 IMPLANT
TOWEL GREEN STERILE FF (TOWEL DISPOSABLE) ×1 IMPLANT
UNDERPAD 30X30 (UNDERPADS AND DIAPERS) ×2 IMPLANT

## 2018-08-06 NOTE — Anesthesia Preprocedure Evaluation (Signed)
Anesthesia Evaluation  Patient identified by MRN, date of birth, ID band Patient awake    Reviewed: Allergy & Precautions, NPO status , Patient's Chart, lab work & pertinent test results  Airway Mallampati: I  TM Distance: >3 FB Neck ROM: Full    Dental   Pulmonary Current Smoker,    Pulmonary exam normal        Cardiovascular Normal cardiovascular exam     Neuro/Psych    GI/Hepatic   Endo/Other    Renal/GU      Musculoskeletal   Abdominal   Peds  Hematology   Anesthesia Other Findings   Reproductive/Obstetrics                             Anesthesia Physical Anesthesia Plan  ASA: III  Anesthesia Plan: General   Post-op Pain Management:    Induction: Intravenous  PONV Risk Score and Plan: 1 and Treatment may vary due to age or medical condition  Airway Management Planned: Oral ETT  Additional Equipment:   Intra-op Plan:   Post-operative Plan: Post-operative intubation/ventilation  Informed Consent: I have reviewed the patients History and Physical, chart, labs and discussed the procedure including the risks, benefits and alternatives for the proposed anesthesia with the patient or authorized representative who has indicated his/her understanding and acceptance.       Plan Discussed with: CRNA and Surgeon  Anesthesia Plan Comments:         Anesthesia Quick Evaluation

## 2018-08-06 NOTE — Transfer of Care (Signed)
Immediate Anesthesia Transfer of Care Note  Patient: Christopher [name]  Procedure(s) Performed: Debridement of buttock, scrotum and left leg (N/A Buttocks) Application Of A-Cell Of Back (N/A Buttocks) Application Of A-Cell Of Extremity (Left Leg Upper)  Patient Location: ICU  Anesthesia Type:General  Level of Consciousness: sedated, unresponsive and Patient remains intubated per anesthesia plan  Airway & Oxygen Therapy: Patient Spontanous Breathing, Patient remains intubated per anesthesia plan and Patient placed on Ventilator (see vital sign flow sheet for setting)  Post-op Assessment: Report given to RN and Post -op Vital signs reviewed and stable  Post vital signs: Reviewed and stable  Last Vitals:  Vitals Value Taken Time  BP    Temp    Pulse    Resp    SpO2      Last Pain:  Vitals:   08/06/18 0400  TempSrc: Axillary  PainSc:          Complications: No apparent anesthesia complications

## 2018-08-06 NOTE — Op Note (Signed)
DATE OF OPERATION: 08/06/2018  LOCATION: [facility] Inpatient  PREOPERATIVE DIAGNOSIS: Gluteal wound, entire anterior and lateral left leg wound and scrotal wound  POSTOPERATIVE DIAGNOSIS: Same  PROCEDURE:  1. Excision of gluteal 2 x 5 cm necrotic fascia. 2. Excision of left leg 3 x 4 cm necrotic fascia and skin 3. Preparation of gluteal wound 30 x 40 for placement of Acell (4 sheets of 10 x 15 and 5 gm powder) 4. Preparation of left leg 40 x 60 cm for placement of Acell (8 sheets of 10 x 15 cm and 7 gm powder) 5. Preparation of scrotum 12 x 12 cm left and 12 x 12 cm right for placement of Acell ( 10 x 15 sheet and 1 gm powder)  SURGEON: Claire Sanger Dillingham, DO  ASSISTANT: Carmen Mayo, PA  EBL: 10 cc  CONDITION: Stable  COMPLICATIONS: None  INDICATION: The patient, Christopher [name], is a 53 y.o. male born on [birth year], is here for treatment of a massive wound of the gluteus, left leg and scrotum.   PROCEDURE DETAILS:  The patient was seen prior to surgery and marked.  The IV antibiotics were given. The patient was taken to the operating room and given a general anesthetic. A standard time out was performed and all information was confirmed by those in the room. SCDs were placed.   The patient was placed on the left side down lateral position.  He was prepped and draped.  The area of the gluteus was irrigated with antibiotic solution.  The #10 blade was used to excise the 2 x 5 cm area of necrotic fascia at the right side.  Once clean the 4 sheets of the Acell and 5 gm of powder was applied and secured with the 5-0 Vicryl.  The sorbact was placed over the area of 30 x 40 cm and secured with the vicryl.  The wound was then covered with the KY and kerlex.    The leg was prepped and draped. Another time out was done and agreed by all in the room. The patient was then placed in the supine position.   The 3 x 4 cm are of necrotic distal left leg fascia and skin was excised with  the #10 blade.  The wound was irrigated with antibiotic solution. The 8 sheets of 10 x 15 cm of Acell and 7 gm of powder was applied and secured with the 5-0  Vicryl.  The sorbact was then placed and secured with the vicryl.  The KY and kerlex was applied.   The scrotum was then addressed and the 10 x 15 cm sheet of Acell and 1 gm of powder was used for both left and right testicles.  The Acell was secured with the 5-0 Vicryl.  They were the wrapped with the sorbact and secured with the vicryl.   All areas were the covered with the ABDs and tape.   The patient was allowed to wake up and taken to recovery room in stable condition at the end of the case. The family was notified at the end of the case.  The advanced practice practitioner (APP) assisted throughout the case.  The APP was essential in retraction and counter traction when needed to make the case progress smoothly.  This retraction and assistance made it possible to see the tissue plans for the procedure.  The assistance was needed for blood control, tissue re-approximation and assisted with closure of the incision site.

## 2018-08-06 NOTE — Progress Notes (Signed)
Pt back to OR today for further extensive debridement and A-cell placement.    Clinical update faxed to Laretta Bolster, [facility] Case Manager.  Will continue to follow as pt progresses.    Quintella Baton, RN, BSN  Trauma/Neuro ICU Case Manager [phone]

## 2018-08-06 NOTE — Progress Notes (Signed)
CRITICAL VALUE ALERT  Critical Value:  HGB 6.5  Date & Time Notied:  08/06/2018 @ 1225  Provider Notified: Tresa Endo, PA  Orders Received/Actions taken: transfuse 2 units PRBC's.

## 2018-08-06 NOTE — Interval H&P Note (Signed)
History and Physical Interval Note:  08/06/2018 7:16 AM  Christopher [name]  has presented today for surgery, with the diagnosis of multiple wounds of trunk and lower extremity  The various methods of treatment have been discussed with the patient and family. After consideration of risks, benefits and other options for treatment, the patient has consented to  Procedure(s): Debridement of buttock, scrotum and left leg, placement of acell and vac (N/A) as a surgical intervention .  The patient's history has been reviewed, patient examined, no change in status, stable for surgery.  I have reviewed the patient's chart and labs.  Questions were answered to the patient's satisfaction.     [name]

## 2018-08-06 NOTE — Anesthesia Postprocedure Evaluation (Signed)
Anesthesia Post Note  Patient: Christopher [name]  Procedure(s) Performed: Debridement of buttock, scrotum and left leg (N/A Buttocks) Application Of A-Cell Of Back (N/A Buttocks) Application Of A-Cell Of Extremity (Left Leg Upper)     Patient location during evaluation: SICU Anesthesia Type: General Level of consciousness: sedated Pain management: pain level controlled Vital Signs Assessment: post-procedure vital signs reviewed and stable Respiratory status: patient remains intubated per anesthesia plan Cardiovascular status: stable Postop Assessment: no apparent nausea or vomiting Anesthetic complications: no    Last Vitals:  Vitals:   08/06/18 1330 08/06/18 1533  BP: (!) 88/50 104/64  Pulse: 75 72  Resp: 18   Temp:    SpO2: 98% 99%    Last Pain:  Vitals:   08/06/18 1200  TempSrc: Axillary  PainSc:                  Krystan Northrop DAVID
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
## 2018-08-07 LAB — GLUCOSE, CAPILLARY
Glucose-Capillary: 122 mg/dL — ABNORMAL HIGH (ref 70–99)
Glucose-Capillary: 161 mg/dL — ABNORMAL HIGH (ref 70–99)
Glucose-Capillary: 130 mg/dL — ABNORMAL HIGH (ref 70–99)
Glucose-Capillary: 154 mg/dL — ABNORMAL HIGH (ref 70–99)
Glucose-Capillary: 134 mg/dL — ABNORMAL HIGH (ref 70–99)
Glucose-Capillary: 148 mg/dL — ABNORMAL HIGH (ref 70–99)

## 2018-08-07 LAB — CBC
HCT: 28.4 % — ABNORMAL LOW (ref 39.0–52.0)
Hemoglobin: 8.7 g/dL — ABNORMAL LOW (ref 13.0–17.0)
MCH: 30.4 pg (ref 26.0–34.0)
MCHC: 30.6 g/dL (ref 30.0–36.0)
MCV: 99.3 fL (ref 80.0–100.0)
Platelets: 350 10*3/uL (ref 150–400)
RBC: 2.86 MIL/uL — ABNORMAL LOW (ref 4.22–5.81)
RDW: 20 % — ABNORMAL HIGH (ref 11.5–15.5)
WBC: 7.2 10*3/uL (ref 4.0–10.5)
nRBC: 0.3 % — ABNORMAL HIGH (ref 0.0–0.2)

## 2018-08-07 LAB — BPAM RBC
Blood Product Expiration Date: 202003092359
Blood Product Expiration Date: 202003102359
ISSUE DATE / TIME: 202002241636
ISSUE DATE / TIME: 202002241852
Unit Type and Rh: 5100
Unit Type and Rh: 5100

## 2018-08-07 LAB — TYPE AND SCREEN
ABO/RH(D): O POS
Antibody Screen: NEGATIVE
Unit division: 0
Unit division: 0

## 2018-08-07 LAB — COMPREHENSIVE METABOLIC PANEL
ALT: 131 U/L — ABNORMAL HIGH (ref 0–44)
AST: 115 U/L — ABNORMAL HIGH (ref 15–41)
Albumin: <1 g/dL — ABNORMAL LOW (ref 3.5–5.0)
Alkaline Phosphatase: 307 U/L — ABNORMAL HIGH (ref 38–126)
Anion gap: 8 (ref 5–15)
BUN: 27 mg/dL — ABNORMAL HIGH (ref 6–20)
CO2: 20 mmol/L — ABNORMAL LOW (ref 22–32)
Calcium: 7.3 mg/dL — ABNORMAL LOW (ref 8.9–10.3)
Chloride: 119 mmol/L — ABNORMAL HIGH (ref 98–111)
Creatinine, Ser: 0.41 mg/dL — ABNORMAL LOW (ref 0.61–1.24)
GFR calc Af Amer: >60 mL/min (ref >60)
GFR calc non Af Amer: >60 mL/min (ref >60)
Glucose, Bld: 134 mg/dL — ABNORMAL HIGH (ref 70–99)
Potassium: 3.2 mmol/L — ABNORMAL LOW (ref 3.5–5.1)
Sodium: 147 mmol/L — ABNORMAL HIGH (ref 135–145)
Total Bilirubin: 0.7 mg/dL (ref 0.3–1.2)
Total Protein: 4.6 g/dL — ABNORMAL LOW (ref 6.5–8.1)

## 2018-08-07 MED ORDER — QUETIAPINE FUMARATE 25 MG PO TABS
50.0000 mg | ORAL_TABLET | Freq: Two times a day (BID) | ORAL | Status: DC
  Administered 2018-08-07 – 2018-08-09 (×6): 50 mg
  Filled 2018-08-07 (×7): qty 2

## 2018-08-07 MED ORDER — POTASSIUM CHLORIDE 20 MEQ PO PACK
40.0000 meq | PACK | Freq: Two times a day (BID) | ORAL | Status: DC
  Administered 2018-08-07 – 2018-08-27 (×42): 40 meq
  Filled 2018-08-07 (×42): qty 2

## 2018-08-07 NOTE — Progress Notes (Addendum)
Scheduled 1:30 meeting with patient's fiance, [name] [facility] Case Manager, Dr Janee Morn, and Trauma Case Manager.  Per WC Case Mgr, fiance requesting update and has questions about pt condition/prognosis.    Quintella Baton, RN, BSN  Trauma/Neuro ICU Case Manager [phone]
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
## 2018-08-07 NOTE — Progress Notes (Signed)
Meeting with Dr. Janee Morn, [facility] Case Manager Laretta Bolster, pt's fiance [name], and this RN Case Manager held, per fiance's request.  Dr. Janee Morn discussed pt's condition and answered questions regarding prognosis/treatment plan.  Fiance appreciative of all information given; states she feels better after meeting.    Quintella Baton, RN, BSN  Trauma/Neuro ICU Case Manager [phone]
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
## 2018-08-07 NOTE — Consult Note (Signed)
WOC Nurse ostomy follow up Stoma type/location: LLQ, colostomy Stomal assessment/size: 1 3/4" round, budded, pink, and moist Peristomal assessment: intact  Treatment options for stomal/peristomal skin: 2" barrier ring Output  Ostomy liquid light brown Pouching: 2pc. 2 3/4" with 2" barrier ring Education provided:  No education performed, patient is sedated and intubated. No family in the room Enrolled patient in [place] Secure Start Discharge program: No  WOC Nurse will follow along with you for continued support with ostomy teaching and care [facility] MSN, RN, [place], CNS, [place] [phone]

## 2018-08-08 ENCOUNTER — Encounter (HOSPITAL_COMMUNITY): Payer: Self-pay | Admitting: Plastic Surgery

## 2018-08-08 LAB — BASIC METABOLIC PANEL
Anion gap: 5 (ref 5–15)
BUN: 25 mg/dL — ABNORMAL HIGH (ref 6–20)
CO2: 25 mmol/L (ref 22–32)
Calcium: 8.1 mg/dL — ABNORMAL LOW (ref 8.9–10.3)
Chloride: 117 mmol/L — ABNORMAL HIGH (ref 98–111)
Creatinine, Ser: 0.38 mg/dL — ABNORMAL LOW (ref 0.61–1.24)
GFR calc Af Amer: >60 mL/min (ref >60)
GFR calc non Af Amer: >60 mL/min (ref >60)
Glucose, Bld: 140 mg/dL — ABNORMAL HIGH (ref 70–99)
Potassium: 3.8 mmol/L (ref 3.5–5.1)
Sodium: 147 mmol/L — ABNORMAL HIGH (ref 135–145)

## 2018-08-08 LAB — CBC
HCT: 30.5 % — ABNORMAL LOW (ref 39.0–52.0)
Hemoglobin: 8.9 g/dL — ABNORMAL LOW (ref 13.0–17.0)
MCH: 29.8 pg (ref 26.0–34.0)
MCHC: 29.2 g/dL — ABNORMAL LOW (ref 30.0–36.0)
MCV: 102 fL — ABNORMAL HIGH (ref 80.0–100.0)
Platelets: 348 10*3/uL (ref 150–400)
RBC: 2.99 MIL/uL — ABNORMAL LOW (ref 4.22–5.81)
RDW: 19.6 % — ABNORMAL HIGH (ref 11.5–15.5)
WBC: 8.8 10*3/uL (ref 4.0–10.5)
nRBC: 0 % (ref 0.0–0.2)

## 2018-08-08 LAB — GLUCOSE, CAPILLARY
Glucose-Capillary: 111 mg/dL — ABNORMAL HIGH (ref 70–99)
Glucose-Capillary: 123 mg/dL — ABNORMAL HIGH (ref 70–99)
Glucose-Capillary: 125 mg/dL — ABNORMAL HIGH (ref 70–99)
Glucose-Capillary: 129 mg/dL — ABNORMAL HIGH (ref 70–99)
Glucose-Capillary: 143 mg/dL — ABNORMAL HIGH (ref 70–99)
Glucose-Capillary: 167 mg/dL — ABNORMAL HIGH (ref 70–99)

## 2018-08-08 MED ORDER — ALBUMIN HUMAN 25 % IV SOLN
12.5000 g | Freq: Once | INTRAVENOUS | Status: AC
  Administered 2018-08-08: 12.5 g via INTRAVENOUS
  Filled 2018-08-08: qty 100

## 2018-08-08 MED ORDER — ALTEPLASE 2 MG IJ SOLR
2.0000 mg | Freq: Once | INTRAMUSCULAR | Status: AC
  Administered 2018-08-08: 2 mg
  Filled 2018-08-08: qty 2
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
## 2018-08-09 ENCOUNTER — Inpatient Hospital Stay (HOSPITAL_COMMUNITY): Payer: No Typology Code available for payment source

## 2018-08-09 LAB — BASIC METABOLIC PANEL WITH GFR
Anion gap: 5 (ref 5–15)
BUN: 26 mg/dL — ABNORMAL HIGH (ref 6–20)
CO2: 25 mmol/L (ref 22–32)
Calcium: 8.3 mg/dL — ABNORMAL LOW (ref 8.9–10.3)
Chloride: 114 mmol/L — ABNORMAL HIGH (ref 98–111)
Creatinine, Ser: 0.35 mg/dL — ABNORMAL LOW (ref 0.61–1.24)
GFR calc Af Amer: >60 mL/min
GFR calc non Af Amer: >60 mL/min
Glucose, Bld: 141 mg/dL — ABNORMAL HIGH (ref 70–99)
Potassium: 4 mmol/L (ref 3.5–5.1)
Sodium: 144 mmol/L (ref 135–145)

## 2018-08-09 LAB — GLUCOSE, CAPILLARY
Glucose-Capillary: 104 mg/dL — ABNORMAL HIGH (ref 70–99)
Glucose-Capillary: 110 mg/dL — ABNORMAL HIGH (ref 70–99)
Glucose-Capillary: 132 mg/dL — ABNORMAL HIGH (ref 70–99)
Glucose-Capillary: 136 mg/dL — ABNORMAL HIGH (ref 70–99)
Glucose-Capillary: 138 mg/dL — ABNORMAL HIGH (ref 70–99)
Glucose-Capillary: 143 mg/dL — ABNORMAL HIGH (ref 70–99)

## 2018-08-09 LAB — CBC
HCT: 29 % — ABNORMAL LOW (ref 39.0–52.0)
Hemoglobin: 8.2 g/dL — ABNORMAL LOW (ref 13.0–17.0)
MCH: 29.2 pg (ref 26.0–34.0)
MCHC: 28.3 g/dL — ABNORMAL LOW (ref 30.0–36.0)
MCV: 103.2 fL — ABNORMAL HIGH (ref 80.0–100.0)
Platelets: 326 K/uL (ref 150–400)
RBC: 2.81 MIL/uL — ABNORMAL LOW (ref 4.22–5.81)
RDW: 18.3 % — ABNORMAL HIGH (ref 11.5–15.5)
WBC: 6.7 K/uL (ref 4.0–10.5)
nRBC: 0 % (ref 0.0–0.2)

## 2018-08-09 MED ORDER — ALBUMIN HUMAN 5 % IV SOLN
25.0000 g | Freq: Once | INTRAVENOUS | Status: AC
  Administered 2018-08-09: 25 g via INTRAVENOUS
  Filled 2018-08-09: qty 500
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
## 2018-08-09 NOTE — Progress Notes (Signed)
Ortho Trauma Progress Note  HPI: Patient appears to be doing better. C-collar has been removed. No acute events noted by nursing. Was taken to OR by Dr. Ulice Bold on 08/06/18 for excision of necrotic fascia of gluteal and left lower extremity and placement of Acell. She plans to return to the OR with him in 2 more weeks for more Acell placement. Nurse reports the patient does arouse and follow commands.   Physical Exam: Today's Vitals   08/09/18 0400 08/09/18 0500 08/09/18 0600 08/09/18 0718  BP: 110/61 125/67 114/65   Pulse: 79 (!) 105 88 96  Resp: 20 20 19 14   Temp: 99.8 F (37.7 C)     TempSrc: Axillary     SpO2: 99% 100% 100% 100%  Weight:  84 kg    Height:      PainSc:       Body mass index is 23.77 kg/m.  General: On ventilator, trach clean and intact Cardiac: Heart regular rate and rhythm Lungs: clear to auscultation anterior lung fields bilaterally. LLE/Pelvis: Left leg/groin with extensive dressing in place. Dressing is clean, dry, intact. Leg swollen but compartments soft and compressible. Patient grimaces to passive movement of knee, foot, or ankle. Warm, well perfused foot. Neurovascularly intact   Assessment/Plan: - Repeat pelvic imaging ordered today - Will continue to follow while patient remains in the hospital    Ahnna Dungan A. [facility] ?[phone]? (phone)

## 2018-08-10 ENCOUNTER — Inpatient Hospital Stay: Payer: Self-pay

## 2018-08-10 LAB — GLUCOSE, CAPILLARY
Glucose-Capillary: 114 mg/dL — ABNORMAL HIGH (ref 70–99)
Glucose-Capillary: 128 mg/dL — ABNORMAL HIGH (ref 70–99)
Glucose-Capillary: 130 mg/dL — ABNORMAL HIGH (ref 70–99)
Glucose-Capillary: 128 mg/dL — ABNORMAL HIGH (ref 70–99)
Glucose-Capillary: 104 mg/dL — ABNORMAL HIGH (ref 70–99)
Glucose-Capillary: 100 mg/dL — ABNORMAL HIGH (ref 70–99)

## 2018-08-10 LAB — BASIC METABOLIC PANEL
Anion gap: 5 (ref 5–15)
BUN: 26 mg/dL — ABNORMAL HIGH (ref 6–20)
CO2: 25 mmol/L (ref 22–32)
Calcium: 8.5 mg/dL — ABNORMAL LOW (ref 8.9–10.3)
Chloride: 114 mmol/L — ABNORMAL HIGH (ref 98–111)
Creatinine, Ser: 0.37 mg/dL — ABNORMAL LOW (ref 0.61–1.24)
GFR calc Af Amer: >60 mL/min (ref >60)
GFR calc non Af Amer: >60 mL/min (ref >60)
Glucose, Bld: 137 mg/dL — ABNORMAL HIGH (ref 70–99)
Potassium: 3.9 mmol/L (ref 3.5–5.1)
Sodium: 144 mmol/L (ref 135–145)

## 2018-08-10 LAB — CBC
HCT: 26.3 % — ABNORMAL LOW (ref 39.0–52.0)
Hemoglobin: 7.4 g/dL — ABNORMAL LOW (ref 13.0–17.0)
MCH: 29.1 pg (ref 26.0–34.0)
MCHC: 28.1 g/dL — ABNORMAL LOW (ref 30.0–36.0)
MCV: 103.5 fL — ABNORMAL HIGH (ref 80.0–100.0)
Platelets: 280 10*3/uL (ref 150–400)
RBC: 2.54 MIL/uL — ABNORMAL LOW (ref 4.22–5.81)
RDW: 17.8 % — ABNORMAL HIGH (ref 11.5–15.5)
WBC: 6 10*3/uL (ref 4.0–10.5)
nRBC: 0 % (ref 0.0–0.2)

## 2018-08-10 MED ORDER — SODIUM CHLORIDE 0.9% FLUSH: 10.0000 mL | INTRAVENOUS | Status: DC | PRN

## 2018-08-10 MED ORDER — CLONAZEPAM 1 MG PO TABS
1.0000 mg | ORAL_TABLET | Freq: Two times a day (BID) | ORAL | Status: DC
  Administered 2018-08-10 – 2018-08-19 (×20): 1 mg
  Filled 2018-08-10 (×20): qty 1

## 2018-08-10 MED ORDER — SODIUM CHLORIDE 0.9% FLUSH
10.0000 mL | Freq: Two times a day (BID) | INTRAVENOUS | Status: DC
  Administered 2018-08-10 – 2018-08-18 (×11): 10 mL
  Administered 2018-08-20: 30 mL
  Administered 2018-08-21 (×2): 10 mL
  Administered 2018-08-22: 20 mL
  Administered 2018-08-22: 10 mL
  Administered 2018-08-23: 20 mL
  Administered 2018-08-23 – 2018-08-24 (×2): 10 mL
  Administered 2018-08-24: 30 mL
  Administered 2018-08-25 – 2018-09-06 (×19): 10 mL
  Administered 2018-09-08: 30 mL
  Administered 2018-09-08 – 2018-09-13 (×8): 10 mL
  Administered 2018-09-13: 20 mL
  Administered 2018-09-14 – 2018-09-15 (×3): 10 mL

## 2018-08-10 MED ORDER — QUETIAPINE FUMARATE 100 MG PO TABS
100.0000 mg | ORAL_TABLET | Freq: Two times a day (BID) | ORAL | Status: DC
  Administered 2018-08-10 – 2018-08-16 (×14): 100 mg
  Filled 2018-08-10 (×14): qty 1

## 2018-08-10 MED ORDER — ALTEPLASE 2 MG IJ SOLR
2.0000 mg | Freq: Once | INTRAMUSCULAR | Status: AC
  Administered 2018-08-10: 2 mg
  Filled 2018-08-10: qty 2

## 2018-08-10 MED ORDER — CHLORHEXIDINE GLUCONATE CLOTH 2 % EX PADS
6.0000 | MEDICATED_PAD | Freq: Every day | CUTANEOUS | Status: DC
  Administered 2018-08-11 – 2018-08-12 (×3): 6 via TOPICAL

## 2018-08-10 NOTE — Progress Notes (Signed)
Patient ID: Christopher [name], male   DOB: [birth year], 53 y.o.   MRN: [id number] I spoke with his fiancee, [name], and updated her on his status. I discussed the planned PEG placement for Monday including the procedure, risks, and benefits. She agrees and will sign the permit when she comes in.  Violeta Gelinas, MD, MPH, FACS Trauma: [phone] General Surgery: [phone]  08/10/2018 9:33 AM
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
## 2018-08-10 NOTE — Progress Notes (Signed)
Peripherally Inserted Central Catheter/Midline Placement  The IV Nurse has discussed with the patient and/or persons authorized to consent for the patient, the purpose of this procedure and the potential benefits and risks involved with this procedure.  The benefits include less needle sticks, lab draws from the catheter, and the patient may be discharged home with the catheter. Risks include, but not limited to, infection, bleeding, blood clot (thrombus formation), and puncture of an artery; nerve damage and irregular heartbeat and possibility to perform a PICC exchange if needed/ordered by physician.  Alternatives to this procedure were also discussed.  Bard Power PICC patient education guide, fact sheet on infection prevention and patient information card has been provided to patient /or left at bedside.  See previous copnsent  PICC/Midline Placement Documentation  PICC Double Lumen 08/10/18 PICC Left Brachial 50 cm 1 cm (Active)  Indication for Insertion or Continuance of Line Vasoactive infusions 08/10/2018  3:39 PM  Exposed Catheter (cm) 1 cm 08/10/2018  3:39 PM  Site Assessment Clean;Intact;Dry 08/10/2018  3:39 PM  Lumen #1 Status Flushed;Saline locked;Blood return noted 08/10/2018  3:39 PM  Lumen #2 Status Flushed;Saline locked;Blood return noted 08/10/2018  3:39 PM  Dressing Type Transparent 08/10/2018  3:39 PM  Dressing Status Clean;Dry;Antimicrobial disc in place;Intact 08/10/2018  3:39 PM  Dressing Intervention New dressing 08/10/2018  3:39 PM  Dressing Change Due 08/17/18 08/10/2018  3:39 PM     PICC Triple Lumen 07/19/18 Right Basilic 38 cm 0 cm (Active)  Indication for Insertion or Continuance of Line Vasoactive infusions 08/10/2018  8:00 AM  Exposed Catheter (cm) 0 cm 08/10/2018  6:39 AM  Site Assessment Clean;Dry;Intact 08/10/2018  8:00 AM  Lumen #1 Status Blood return noted;Flushed;Cap changed 08/10/2018  9:00 AM  Lumen #2 Status Infusing 08/10/2018  8:00 AM  Lumen #3 Status Blood return  noted;Flushed;Cap changed 08/10/2018  9:00 AM  Dressing Type Transparent;Occlusive;Securing device 08/10/2018  8:00 AM  Dressing Status Clean;Dry;Intact;Antimicrobial disc in place 08/10/2018  8:00 AM  Line Care Connections checked and tightened 08/10/2018  8:00 AM  Line Adjustment (NICU/IV Team Only) No 08/05/2018  8:00 AM  Dressing Intervention New dressing 08/09/2018  4:00 AM  Dressing Change Due 07/18/18 08/10/2018  6:39 AM       Christopher [name] 08/10/2018, 3:40 PM

## 2018-08-10 NOTE — Progress Notes (Signed)
RT note: Placed patient on Ventilator rest mode per RN request for wound care.

## 2018-08-11 LAB — CBC
HCT: 27.4 % — ABNORMAL LOW (ref 39.0–52.0)
Hemoglobin: 7.8 g/dL — ABNORMAL LOW (ref 13.0–17.0)
MCH: 29.3 pg (ref 26.0–34.0)
MCHC: 28.5 g/dL — ABNORMAL LOW (ref 30.0–36.0)
MCV: 103 fL — ABNORMAL HIGH (ref 80.0–100.0)
Platelets: 314 10*3/uL (ref 150–400)
RBC: 2.66 MIL/uL — ABNORMAL LOW (ref 4.22–5.81)
RDW: 17.7 % — ABNORMAL HIGH (ref 11.5–15.5)
WBC: 6.7 10*3/uL (ref 4.0–10.5)
nRBC: 0 % (ref 0.0–0.2)

## 2018-08-11 LAB — GLUCOSE, CAPILLARY
Glucose-Capillary: 123 mg/dL — ABNORMAL HIGH (ref 70–99)
Glucose-Capillary: 125 mg/dL — ABNORMAL HIGH (ref 70–99)
Glucose-Capillary: 132 mg/dL — ABNORMAL HIGH (ref 70–99)
Glucose-Capillary: 131 mg/dL — ABNORMAL HIGH (ref 70–99)
Glucose-Capillary: 119 mg/dL — ABNORMAL HIGH (ref 70–99)
Glucose-Capillary: 124 mg/dL — ABNORMAL HIGH (ref 70–99)
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
## 2018-08-11 NOTE — Progress Notes (Signed)
Subjective: Patient seen today in ICU.  He responded with opening his eyes.  Nurse at bedside  Objective: Vital signs in last 24 hours: Temp:  [98.3 F (36.8 C)-102 F (38.9 C)] 98.3 F (36.8 C) (02/29 1600) Pulse Rate:  [82-125] 121 (02/29 1520) Resp:  [14-28] 28 (02/29 1520) BP: (86-153)/(46-105) 96/51 (02/29 1600) SpO2:  [96 %-100 %] 100 % (02/29 1520) FiO2 (%):  [40 %] 40 % (02/29 1520) Weight change:  Last BM Date: 08/11/18  Intake/Output from previous day: 02/28 0701 - 02/29 0700 In: 4757.8 [I.V.:2940.4; NG/GT:1817.4] Out: 1820 [Urine:1800; Stool:20] Intake/Output this shift: Total I/O In: 1823.4 [I.V.:873.4; NG/GT:950] Out: 910 [Urine:750; Stool:160]  General appearance: resting comfortably, not in distress Incision/Wound: The wounds are markedly improved.  No sign of infection.  It appears like the acell has incorporated.    Lab Results: Recent Labs    08/10/18 0500 08/11/18 0500  WBC 6.0 6.7  HGB 7.4* 7.8*  HCT 26.3* 27.4*  PLT 280 314   BMET Recent Labs    08/09/18 0516 08/10/18 0500  NA 144 144  K 4.0 3.9  CL 114* 114*  CO2 25 25  GLUCOSE 141* 137*  BUN 26* 26*  CREATININE 0.35* 0.37*  CALCIUM 8.3* 8.5*    Studies/Results: [place] Ekg Site Rite  Result Date: 08/10/2018 If Site Rite image not attached, placement could not be confirmed due to current cardiac rhythm.   Medications: I have reviewed the patient's current medications.  Assessment/Plan: Plan OR Monday for further debridement and acell placement.  LOS: 32 days   Peggye Form 08/11/2018, 5:36 PM

## 2018-08-11 NOTE — Progress Notes (Addendum)
Plastic surgeon, Dr. Ulice Bold rounded at bedside to assess wounds. Updated verbal order on how to dress wounds are below:  First apply a copious amount of surgical lube over acell (green material on wound). Next, apply DRY gauze followed by ABD pads.  For the parts of the wound not covered with acell, please do a standard wet to dry dressing, then cover with ABD pads.   Do not use the mini gauze for the testicle wound. Use standard 4x4 gauze puff roll.

## 2018-08-12 LAB — CBC
HCT: 24.9 % — ABNORMAL LOW (ref 39.0–52.0)
Hemoglobin: 7 g/dL — ABNORMAL LOW (ref 13.0–17.0)
MCH: 29.3 pg (ref 26.0–34.0)
MCHC: 28.1 g/dL — ABNORMAL LOW (ref 30.0–36.0)
MCV: 104.2 fL — ABNORMAL HIGH (ref 80.0–100.0)
Platelets: 291 10*3/uL (ref 150–400)
RBC: 2.39 MIL/uL — ABNORMAL LOW (ref 4.22–5.81)
RDW: 17 % — ABNORMAL HIGH (ref 11.5–15.5)
WBC: 5.7 10*3/uL (ref 4.0–10.5)
nRBC: 0 % (ref 0.0–0.2)

## 2018-08-12 LAB — GLUCOSE, CAPILLARY
Glucose-Capillary: 116 mg/dL — ABNORMAL HIGH (ref 70–99)
Glucose-Capillary: 118 mg/dL — ABNORMAL HIGH (ref 70–99)
Glucose-Capillary: 139 mg/dL — ABNORMAL HIGH (ref 70–99)
Glucose-Capillary: 164 mg/dL — ABNORMAL HIGH (ref 70–99)
Glucose-Capillary: 137 mg/dL — ABNORMAL HIGH (ref 70–99)
Glucose-Capillary: 126 mg/dL — ABNORMAL HIGH (ref 70–99)

## 2018-08-12 LAB — BASIC METABOLIC PANEL
Anion gap: 5 (ref 5–15)
BUN: 26 mg/dL — ABNORMAL HIGH (ref 6–20)
CO2: 24 mmol/L (ref 22–32)
Calcium: 7.6 mg/dL — ABNORMAL LOW (ref 8.9–10.3)
Chloride: 115 mmol/L — ABNORMAL HIGH (ref 98–111)
Creatinine, Ser: <0.3 mg/dL — ABNORMAL LOW (ref 0.61–1.24)
Glucose, Bld: 145 mg/dL — ABNORMAL HIGH (ref 70–99)
Potassium: 3.7 mmol/L (ref 3.5–5.1)
Sodium: 144 mmol/L (ref 135–145)

## 2018-08-12 LAB — PREPARE RBC (CROSSMATCH)

## 2018-08-12 MED ORDER — SODIUM CHLORIDE 0.9% IV SOLUTION
Freq: Once | INTRAVENOUS | Status: AC
  Administered 2018-08-13: 02:00:00 via INTRAVENOUS

## 2018-08-12 NOTE — Progress Notes (Signed)
RT NOTE:  Pt became very agitated, high RR, and diaphoretic with weaning trial. Temporarily took pt off of vent to provide bag ventilation. Pt was placed back on previous ventilator settings and is stable at this time. RT will continue to monitor.

## 2018-08-12 NOTE — Progress Notes (Addendum)
I spoke with Dr. Ulice Bold via telephone. Plan for OR tomorrow 08/13/2018. Will keep patient NPO after midnight.   MD states she will write more specific orders for wound care dressing changes after I&D tomorrow. For now, continue with current orders.   Surgical lube over acell, dry 4x4 gauze and ABD to cover.   Consent obtained, signed and in chart for procedure tomorrow.
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
## 2018-08-13 ENCOUNTER — Encounter (HOSPITAL_COMMUNITY)
Admission: EM | Disposition: A | Discharge status: Nursing Home (Includes ICF) | Payer: Self-pay | Source: Home / Self Care

## 2018-08-13 ENCOUNTER — Inpatient Hospital Stay (HOSPITAL_COMMUNITY): Payer: No Typology Code available for payment source | Admitting: Anesthesiology

## 2018-08-13 ENCOUNTER — Encounter (HOSPITAL_COMMUNITY): Payer: Self-pay | Admitting: Anesthesiology

## 2018-08-13 DIAGNOSIS — S31829A Unspecified open wound of left buttock, initial encounter: Secondary | ICD-10-CM

## 2018-08-13 DIAGNOSIS — L8990 Pressure ulcer of unspecified site, unspecified stage: Secondary | ICD-10-CM

## 2018-08-13 DIAGNOSIS — S31819A Unspecified open wound of right buttock, initial encounter: Secondary | ICD-10-CM

## 2018-08-13 DIAGNOSIS — S3130XA Unspecified open wound of scrotum and testes, initial encounter: Secondary | ICD-10-CM

## 2018-08-13 DIAGNOSIS — S81802A Unspecified open wound, left lower leg, initial encounter: Secondary | ICD-10-CM

## 2018-08-13 HISTORY — PX: I & D EXTREMITY: SHX5045

## 2018-08-13 LAB — GLUCOSE, CAPILLARY
Glucose-Capillary: 100 mg/dL — ABNORMAL HIGH (ref 70–99)
Glucose-Capillary: 149 mg/dL — ABNORMAL HIGH (ref 70–99)
Glucose-Capillary: 64 mg/dL — ABNORMAL LOW (ref 70–99)
Glucose-Capillary: 72 mg/dL (ref 70–99)
Glucose-Capillary: 81 mg/dL (ref 70–99)
Glucose-Capillary: 81 mg/dL (ref 70–99)
Glucose-Capillary: 90 mg/dL (ref 70–99)

## 2018-08-13 LAB — CBC
HCT: 24.1 % — ABNORMAL LOW (ref 39.0–52.0)
HCT: 29 % — ABNORMAL LOW (ref 39.0–52.0)
Hemoglobin: 6.9 g/dL — CL (ref 13.0–17.0)
Hemoglobin: 8.5 g/dL — ABNORMAL LOW (ref 13.0–17.0)
MCH: 30.4 pg (ref 26.0–34.0)
MCH: 29.1 pg (ref 26.0–34.0)
MCHC: 28.6 g/dL — ABNORMAL LOW (ref 30.0–36.0)
MCHC: 29.3 g/dL — ABNORMAL LOW (ref 30.0–36.0)
MCV: 106.2 fL — ABNORMAL HIGH (ref 80.0–100.0)
MCV: 99.3 fL (ref 80.0–100.0)
Platelets: 253 10*3/uL (ref 150–400)
Platelets: 259 10*3/uL (ref 150–400)
RBC: 2.27 MIL/uL — ABNORMAL LOW (ref 4.22–5.81)
RBC: 2.92 MIL/uL — ABNORMAL LOW (ref 4.22–5.81)
RDW: 20.5 % — ABNORMAL HIGH (ref 11.5–15.5)
RDW: 18.2 % — ABNORMAL HIGH (ref 11.5–15.5)
WBC: 5.7 10*3/uL (ref 4.0–10.5)
WBC: 7 10*3/uL (ref 4.0–10.5)
nRBC: 0 % (ref 0.0–0.2)
nRBC: 0 % (ref 0.0–0.2)

## 2018-08-13 LAB — BASIC METABOLIC PANEL
Anion gap: <3 — ABNORMAL LOW (ref 5–15)
BUN: 23 mg/dL — ABNORMAL HIGH (ref 6–20)
CO2: 21 mmol/L — ABNORMAL LOW (ref 22–32)
Calcium: 6.9 mg/dL — ABNORMAL LOW (ref 8.9–10.3)
Chloride: 120 mmol/L — ABNORMAL HIGH (ref 98–111)
Creatinine, Ser: <0.3 mg/dL — ABNORMAL LOW (ref 0.61–1.24)
Glucose, Bld: 88 mg/dL (ref 70–99)
Potassium: 4.1 mmol/L (ref 3.5–5.1)
Sodium: 143 mmol/L (ref 135–145)

## 2018-08-13 SURGERY — IRRIGATION AND DEBRIDEMENT EXTREMITY
Anesthesia: General | Site: Thigh

## 2018-08-13 MED ORDER — SODIUM CHLORIDE 0.9 % IV SOLN
INTRAVENOUS | Status: AC
  Filled 2018-08-13: qty 500000

## 2018-08-13 MED ORDER — PIPERACILLIN-TAZOBACTAM 3.375 G IVPB
3.3750 g | Freq: Three times a day (TID) | INTRAVENOUS | Status: DC
  Administered 2018-08-13 – 2018-08-16 (×8): 3.375 g via INTRAVENOUS
  Filled 2018-08-13 (×9): qty 50

## 2018-08-13 MED ORDER — PHENYLEPHRINE 40 MCG/ML (10ML) SYRINGE FOR IV PUSH (FOR BLOOD PRESSURE SUPPORT)
PREFILLED_SYRINGE | INTRAVENOUS | Status: DC | PRN
  Administered 2018-08-13: 80 ug via INTRAVENOUS

## 2018-08-13 MED ORDER — ONDANSETRON HCL 4 MG/2ML IJ SOLN
INTRAMUSCULAR | Status: DC | PRN
  Administered 2018-08-13: 4 mg via INTRAVENOUS

## 2018-08-13 MED ORDER — FENTANYL CITRATE (PF) 100 MCG/2ML IJ SOLN
INTRAMUSCULAR | Status: DC | PRN
  Administered 2018-08-13: 100 ug via INTRAVENOUS
  Administered 2018-08-13: 50 ug via INTRAVENOUS
  Administered 2018-08-13: 100 ug via INTRAVENOUS

## 2018-08-13 MED ORDER — PROPOFOL 10 MG/ML IV BOLUS
INTRAVENOUS | Status: AC
  Filled 2018-08-13: qty 40

## 2018-08-13 MED ORDER — ALBUMIN HUMAN 5 % IV SOLN
INTRAVENOUS | Status: DC | PRN
  Administered 2018-08-13: 14:00:00 via INTRAVENOUS

## 2018-08-13 MED ORDER — MIDAZOLAM HCL 2 MG/2ML IJ SOLN
INTRAMUSCULAR | Status: AC
  Filled 2018-08-13: qty 2

## 2018-08-13 MED ORDER — ROCURONIUM BROMIDE 10 MG/ML (PF) SYRINGE
PREFILLED_SYRINGE | INTRAVENOUS | Status: DC | PRN
  Administered 2018-08-13 (×3): 50 mg via INTRAVENOUS

## 2018-08-13 MED ORDER — PHENYLEPHRINE 40 MCG/ML (10ML) SYRINGE FOR IV PUSH (FOR BLOOD PRESSURE SUPPORT)
PREFILLED_SYRINGE | INTRAVENOUS | Status: AC
  Filled 2018-08-13: qty 40

## 2018-08-13 MED ORDER — 0.9 % SODIUM CHLORIDE (POUR BTL) OPTIME
TOPICAL | Status: DC | PRN
  Administered 2018-08-13: 1000 mL

## 2018-08-13 MED ORDER — PROPOFOL 10 MG/ML IV BOLUS
INTRAVENOUS | Status: DC | PRN
  Administered 2018-08-13: 50 mg via INTRAVENOUS

## 2018-08-13 MED ORDER — PIPERACILLIN-TAZOBACTAM 3.375 G IVPB 30 MIN
3.3750 g | Freq: Once | INTRAVENOUS | Status: AC
  Administered 2018-08-13: 3.375 g via INTRAVENOUS
  Filled 2018-08-13: qty 50

## 2018-08-13 MED ORDER — LACTATED RINGERS IV SOLN
INTRAVENOUS | Status: DC | PRN
  Administered 2018-08-13: 13:00:00 via INTRAVENOUS

## 2018-08-13 MED ORDER — CHLORHEXIDINE GLUCONATE CLOTH 2 % EX PADS
6.0000 | MEDICATED_PAD | Freq: Every day | CUTANEOUS | Status: DC
  Administered 2018-08-14 – 2018-09-14 (×29): 6 via TOPICAL

## 2018-08-13 MED ORDER — ONDANSETRON HCL 4 MG/2ML IJ SOLN
INTRAMUSCULAR | Status: AC
  Filled 2018-08-13: qty 2

## 2018-08-13 MED ORDER — SODIUM CHLORIDE 0.9 % IV SOLN
INTRAVENOUS | Status: DC | PRN
  Administered 2018-08-13: 500 mL

## 2018-08-13 MED ORDER — FENTANYL CITRATE (PF) 250 MCG/5ML IJ SOLN
INTRAMUSCULAR | Status: AC
  Filled 2018-08-13: qty 5

## 2018-08-13 MED ORDER — EPHEDRINE 5 MG/ML INJ
INTRAVENOUS | Status: AC
  Filled 2018-08-13: qty 10

## 2018-08-13 MED ORDER — CHLORHEXIDINE GLUCONATE CLOTH 2 % EX PADS
6.0000 | MEDICATED_PAD | Freq: Once | CUTANEOUS | Status: AC
  Administered 2018-08-13: 6 via TOPICAL

## 2018-08-13 MED ORDER — LIDOCAINE 2% (20 MG/ML) 5 ML SYRINGE
INTRAMUSCULAR | Status: AC
  Filled 2018-08-13: qty 5

## 2018-08-13 MED ORDER — CHLORHEXIDINE GLUCONATE CLOTH 2 % EX PADS: 6.0000 | MEDICATED_PAD | Freq: Once | CUTANEOUS | Status: AC

## 2018-08-13 MED ORDER — DEXTROSE 50 % IV SOLN
INTRAVENOUS | Status: AC
  Administered 2018-08-13: 50 mL
  Filled 2018-08-13: qty 50

## 2018-08-13 MED ORDER — MIDAZOLAM HCL 5 MG/5ML IJ SOLN
INTRAMUSCULAR | Status: DC | PRN
  Administered 2018-08-13: 2 mg via INTRAVENOUS

## 2018-08-13 MED ORDER — ROCURONIUM BROMIDE 50 MG/5ML IV SOSY
PREFILLED_SYRINGE | INTRAVENOUS | Status: AC
  Filled 2018-08-13: qty 10

## 2018-08-13 SURGICAL SUPPLY — 46 items
BNDG GAUZE ELAST 4 BULKY (GAUZE/BANDAGES/DRESSINGS) ×12 IMPLANT
CANISTER SUCT 3000ML PPV (MISCELLANEOUS) ×2 IMPLANT
COVER SURGICAL LIGHT HANDLE (MISCELLANEOUS) ×2 IMPLANT
COVER WAND RF STERILE (DRAPES) ×2 IMPLANT
DRAPE HALF SHEET 40X57 (DRAPES) ×2 IMPLANT
DRAPE INCISE IOBAN 66X45 STRL (DRAPES) IMPLANT
DRAPE ORTHO SPLIT 77X108 STRL (DRAPES) ×2
DRAPE SURG ORHT 6 SPLT 77X108 (DRAPES) ×2 IMPLANT
DRESSING HYDROCOLLOID 4X4 (GAUZE/BANDAGES/DRESSINGS) IMPLANT
DRSG ADAPTIC 3X8 NADH LF (GAUZE/BANDAGES/DRESSINGS) IMPLANT
DRSG CUTIMED SORBACT 7X9 (GAUZE/BANDAGES/DRESSINGS) ×16 IMPLANT
DRSG PAD ABDOMINAL 8X10 ST (GAUZE/BANDAGES/DRESSINGS) ×26 IMPLANT
DRSG VAC ATS LRG SENSATRAC (GAUZE/BANDAGES/DRESSINGS) IMPLANT
DRSG VAC ATS MED SENSATRAC (GAUZE/BANDAGES/DRESSINGS) IMPLANT
DRSG VAC ATS SM SENSATRAC (GAUZE/BANDAGES/DRESSINGS) IMPLANT
ELECT REM PT RETURN 9FT ADLT (ELECTROSURGICAL) ×2
ELECTRODE REM PT RTRN 9FT ADLT (ELECTROSURGICAL) ×1 IMPLANT
GAUZE SPONGE 4X4 12PLY STRL (GAUZE/BANDAGES/DRESSINGS) ×12 IMPLANT
GEL ULTRASOUND 20GR AQUASONIC (MISCELLANEOUS) IMPLANT
GLOVE BIO SURGEON STRL SZ 6.5 (GLOVE) ×4 IMPLANT
GLOVE SURG SS PI 6.5 STRL IVOR (GLOVE) ×2 IMPLANT
GOWN STRL REUS W/ TWL LRG LVL3 (GOWN DISPOSABLE) ×3 IMPLANT
GOWN STRL REUS W/TWL LRG LVL3 (GOWN DISPOSABLE) ×3
KIT BASIN OR (CUSTOM PROCEDURE TRAY) ×2 IMPLANT
KIT TURNOVER KIT B (KITS) ×2 IMPLANT
MARKER SKIN DUAL TIP RULER LAB (MISCELLANEOUS) ×2 IMPLANT
MATRIX WOUND 3-LAYER 10X15 (Tissue) ×24 IMPLANT
MATRIX WOUND 3-LAYER 7X10 (Tissue) ×2 IMPLANT
MICROMATRIX 1000MG (Tissue) ×28 IMPLANT
NS IRRIG 1000ML POUR BTL (IV SOLUTION) ×2 IMPLANT
PACK GENERAL/GYN (CUSTOM PROCEDURE TRAY) ×2 IMPLANT
PAD ARMBOARD 7.5X6 YLW CONV (MISCELLANEOUS) ×6 IMPLANT
PAD NEG PRESSURE SENSATRAC (MISCELLANEOUS) IMPLANT
PENCIL BUTTON HOLSTER BLD 10FT (ELECTRODE) ×2 IMPLANT
SOLUTION PARTIC MCRMTRX 1000MG (Tissue) ×14 IMPLANT
SPONGE LAP 18X18 RF (DISPOSABLE) ×4 IMPLANT
SURGILUBE 2OZ TUBE FLIPTOP (MISCELLANEOUS) ×20 IMPLANT
SUT VIC AB 4-0 PS2 27 (SUTURE) ×6 IMPLANT
SUT VIC AB 5-0 PS2 18 (SUTURE) ×10 IMPLANT
SUT VICRYL 4-0 PS2 18IN ABS (SUTURE) ×42 IMPLANT
TAPE CLOTH SURG 6X10 WHT LF (GAUZE/BANDAGES/DRESSINGS) ×6 IMPLANT
TOWEL GREEN STERILE (TOWEL DISPOSABLE) ×2 IMPLANT
TOWEL GREEN STERILE FF (TOWEL DISPOSABLE) ×2 IMPLANT
TUBE CONNECTING 12X1/4 (SUCTIONS) ×2 IMPLANT
UNDERPAD 30X30 (UNDERPADS AND DIAPERS) IMPLANT
YANKAUER SUCT BULB TIP NO VENT (SUCTIONS) ×2 IMPLANT

## 2018-08-13 NOTE — Anesthesia Postprocedure Evaluation (Signed)
Anesthesia Post Note  Patient: Kaiyen Fekete Carrell  Procedure(s) Performed: Debridement of buttock, scrotum and left leg, placement of acell and vac (N/A Thigh)     Patient location during evaluation: NICU Anesthesia Type: General Level of consciousness: sedated and patient remains intubated per anesthesia plan Pain management: pain level controlled Vital Signs Assessment: post-procedure vital signs reviewed and stable Respiratory status: patient remains intubated per anesthesia plan and patient on ventilator - see flowsheet for VS Cardiovascular status: stable and blood pressure returned to baseline Postop Assessment: no apparent nausea or vomiting Anesthetic complications: no    Last Vitals:  Vitals:   08/13/18 1600 08/13/18 1615  BP: 108/72 113/71  Pulse:    Resp: 18 18  Temp: 36.9 C   SpO2: 100%     Last Pain:  Vitals:   08/13/18 1600  TempSrc: Axillary  PainSc:                  Kilan Banfill COKER

## 2018-08-13 NOTE — Progress Notes (Signed)
Patient brought back from the OR & placed on the vent with previous settings.  Jacqulynn Cadet RRT
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
## 2018-08-13 NOTE — Transfer of Care (Signed)
Immediate Anesthesia Transfer of Care Note  Patient: Christopher [name]  Procedure(s) Performed: Debridement of buttock, scrotum and left leg, placement of acell and vac (N/A Thigh)  Patient Location: ICU  Anesthesia Type:General and receiving RN at bedside  Level of Consciousness: sedated, unresponsive and Patient remains intubated per anesthesia plan  Airway & Oxygen Therapy: Patient remains intubated per anesthesia plan and Patient placed on Ventilator (see vital sign flow sheet for setting)  Post-op Assessment: Report given to RN and Post -op Vital signs reviewed and stable  Post vital signs: Reviewed and stable  Last Vitals:  Vitals Value Taken Time  BP 99/58 08/13/2018  3:47 PM  Temp    Pulse    Resp 18 08/13/2018  3:49 PM  SpO2    Vitals shown include unvalidated device data.  Last Pain:  Vitals:   08/13/18 1200  TempSrc: Axillary  PainSc:          Complications: No apparent anesthesia complications
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
## 2018-08-13 NOTE — Anesthesia Procedure Notes (Signed)
Date/Time: 08/13/2018 1:07 PM Performed by: Lovie Chol, CRNA Pre-anesthesia Checklist: Patient identified, Emergency Drugs available, Suction available and Patient being monitored Patient Re-evaluated:Patient Re-evaluated prior to induction Oxygen Delivery Method: Circle system utilized Preoxygenation: Pre-oxygenation with 100% oxygen Induction Type: Inhalational induction with existing ETT

## 2018-08-13 NOTE — Anesthesia Preprocedure Evaluation (Addendum)
Anesthesia Evaluation  Patient identified by MRN, date of birth, ID band Patient unresponsive    Reviewed: Allergy & Precautions, NPO status , Patient's Chart, lab work & pertinent test results, Unable to perform ROS - Chart review only  Airway Mallampati: Trach       Dental  (+) Poor Dentition   Pulmonary Current Smoker,    + rhonchi        Cardiovascular  Rhythm:Regular Rate:Normal     Neuro/Psych    GI/Hepatic   Endo/Other    Renal/GU      Musculoskeletal   Abdominal   Peds  Hematology   Anesthesia Other Findings   Reproductive/Obstetrics                            Anesthesia Physical Anesthesia Plan  ASA: III  Anesthesia Plan: General   Post-op Pain Management:    Induction: Inhalational  PONV Risk Score and Plan: 2 and Ondansetron and Dexamethasone  Airway Management Planned: Tracheostomy  Additional Equipment:   Intra-op Plan:   Post-operative Plan: Post-operative intubation/ventilation  Informed Consent: I have reviewed the patients History and Physical, chart, labs and discussed the procedure including the risks, benefits and alternatives for the proposed anesthesia with the patient or authorized representative who has indicated his/her understanding and acceptance.       Plan Discussed with: CRNA, Anesthesiologist and Surgeon  Anesthesia Plan Comments:         Anesthesia Quick Evaluation

## 2018-08-13 NOTE — Progress Notes (Signed)
Pharmacy Antibiotic Note  Christopher [name] is a 53 y.o. male admitted on 07/10/2018 with ongoing fever.  Pharmacy has been consulted for Zosyn dosing.  Temperature spike throughout the day to >101.5. MD will draw cultures.  Plan: Zosyn 3.375g IV q8h (4 hour infusion).  Monitor renal function, and C&S  Height: 6' 2.02" (188 cm) Weight: 196 lb 13.9 oz (89.3 kg) IBW/kg (Calculated) : 82.24  Temp (24hrs), Avg:100.1 F (37.8 C), Min:97.9 F (36.6 C), Max:101.6 F (38.7 C)  Recent Labs  Lab 08/08/18 0524 08/09/18 0516 08/10/18 0500 08/11/18 0500 08/12/18 0521 08/13/18 0630  WBC 8.8 6.7 6.0 6.7 5.7 5.7  CREATININE 0.38* 0.35* 0.37*  --  <0.30* <0.30*    CrCl cannot be calculated (This lab value cannot be used to calculate CrCl because it is not a number: <0.30).    No Known Allergies  Antimicrobials this admission: Zosyn 2/4>>2/14; 3/2>> CTX 1/29>>2/4 Flagyl 1/29>>2/4 Cefazolin 1/29>>1/29 Unasyn 2/19 >> 2/26  Microbiology results: 2/4 Blood - NG final 1/29 MRSA - NEG 2/17 Blood x 2 - NG x4 2/17 Resp - Acinetobacter (S-Unasyn) 3/2 reculture  Thank you for allowing pharmacy to be a part of this patient's care.  Jeanella Cara, PharmD, [facility] Clinical Pharmacist Please see AMION for all Pharmacists' Contact Phone Numbers 08/13/2018, 9:31 AM

## 2018-08-14 ENCOUNTER — Encounter (HOSPITAL_COMMUNITY)
Admission: EM | Disposition: A | Discharge status: Nursing Home (Includes ICF) | Payer: Self-pay | Source: Home / Self Care

## 2018-08-14 ENCOUNTER — Encounter (HOSPITAL_COMMUNITY): Payer: Self-pay | Admitting: Plastic Surgery

## 2018-08-14 HISTORY — PX: ESOPHAGOGASTRODUODENOSCOPY: SHX5428

## 2018-08-14 HISTORY — PX: PEG PLACEMENT: SHX5437

## 2018-08-14 LAB — BPAM RBC
Blood Product Expiration Date: 202003272359
Blood Product Expiration Date: 202003272359
ISSUE DATE / TIME: 202003020112
ISSUE DATE / TIME: 202003020748
Unit Type and Rh: 5100
Unit Type and Rh: 5100

## 2018-08-14 LAB — TYPE AND SCREEN
ABO/RH(D): O POS
Antibody Screen: NEGATIVE
Unit division: 0
Unit division: 0

## 2018-08-14 LAB — CBC
HCT: 27 % — ABNORMAL LOW (ref 39.0–52.0)
Hemoglobin: 7.8 g/dL — ABNORMAL LOW (ref 13.0–17.0)
MCH: 29.3 pg (ref 26.0–34.0)
MCHC: 28.9 g/dL — ABNORMAL LOW (ref 30.0–36.0)
MCV: 101.5 fL — ABNORMAL HIGH (ref 80.0–100.0)
Platelets: 268 10*3/uL (ref 150–400)
RBC: 2.66 MIL/uL — ABNORMAL LOW (ref 4.22–5.81)
RDW: 17.7 % — ABNORMAL HIGH (ref 11.5–15.5)
WBC: 4.8 10*3/uL (ref 4.0–10.5)
nRBC: 0 % (ref 0.0–0.2)

## 2018-08-14 LAB — GLUCOSE, CAPILLARY
Glucose-Capillary: 24 mg/dL — CL (ref 70–99)
Glucose-Capillary: 135 mg/dL — ABNORMAL HIGH (ref 70–99)
Glucose-Capillary: 119 mg/dL — ABNORMAL HIGH (ref 70–99)
Glucose-Capillary: 100 mg/dL — ABNORMAL HIGH (ref 70–99)
Glucose-Capillary: 100 mg/dL — ABNORMAL HIGH (ref 70–99)
Glucose-Capillary: 136 mg/dL — ABNORMAL HIGH (ref 70–99)
Glucose-Capillary: 140 mg/dL — ABNORMAL HIGH (ref 70–99)

## 2018-08-14 SURGERY — EGD (ESOPHAGOGASTRODUODENOSCOPY)

## 2018-08-14 MED ORDER — VECURONIUM BROMIDE 10 MG IV SOLR
10.0000 mg | Freq: Once | INTRAVENOUS | Status: AC
  Administered 2018-08-14: 10 mg via INTRAVENOUS
  Filled 2018-08-14: qty 10

## 2018-08-14 MED ORDER — MIDAZOLAM HCL 2 MG/2ML IJ SOLN
2.0000 mg | Freq: Once | INTRAMUSCULAR | Status: AC
  Administered 2018-08-14: 2 mg via INTRAVENOUS
  Filled 2018-08-14: qty 2

## 2018-08-14 MED ORDER — PHENYLEPHRINE HCL-NACL 40-0.9 MG/250ML-% IV SOLN
0.0000 ug/min | INTRAVENOUS | Status: DC
  Administered 2018-08-14: 90 ug/min via INTRAVENOUS
  Administered 2018-08-14: 100 ug/min via INTRAVENOUS
  Administered 2018-08-14: 125 ug/min via INTRAVENOUS
  Administered 2018-08-15: 60 ug/min via INTRAVENOUS
  Filled 2018-08-14 (×5): qty 250

## 2018-08-14 MED ORDER — PIVOT 1.5 CAL PO LIQD
1000.0000 mL | ORAL | Status: DC
  Administered 2018-08-14 – 2018-08-25 (×17): 1000 mL
  Filled 2018-08-14: qty 1000

## 2018-08-14 MED ORDER — PIVOT 1.5 CAL PO LIQD: 1000.0000 mL | ORAL | Status: DC
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
## 2018-08-15 LAB — CULTURE, RESPIRATORY: Special Requests: NORMAL

## 2018-08-15 LAB — CULTURE, RESPIRATORY W GRAM STAIN: Culture: NORMAL

## 2018-08-15 LAB — BASIC METABOLIC PANEL
Anion gap: 6 (ref 5–15)
BUN: 21 mg/dL — ABNORMAL HIGH (ref 6–20)
CO2: 22 mmol/L (ref 22–32)
Calcium: 7.7 mg/dL — ABNORMAL LOW (ref 8.9–10.3)
Chloride: 115 mmol/L — ABNORMAL HIGH (ref 98–111)
Creatinine, Ser: 0.38 mg/dL — ABNORMAL LOW (ref 0.61–1.24)
GFR calc Af Amer: >60 mL/min (ref >60)
GFR calc non Af Amer: >60 mL/min (ref >60)
Glucose, Bld: 122 mg/dL — ABNORMAL HIGH (ref 70–99)
Potassium: 3.7 mmol/L (ref 3.5–5.1)
Sodium: 143 mmol/L (ref 135–145)

## 2018-08-15 LAB — CBC
HCT: 29.2 % — ABNORMAL LOW (ref 39.0–52.0)
Hemoglobin: 8.3 g/dL — ABNORMAL LOW (ref 13.0–17.0)
MCH: 28.9 pg (ref 26.0–34.0)
MCHC: 28.4 g/dL — ABNORMAL LOW (ref 30.0–36.0)
MCV: 101.7 fL — ABNORMAL HIGH (ref 80.0–100.0)
Platelets: 294 10*3/uL (ref 150–400)
RBC: 2.87 MIL/uL — ABNORMAL LOW (ref 4.22–5.81)
RDW: 17.2 % — ABNORMAL HIGH (ref 11.5–15.5)
WBC: 6.6 10*3/uL (ref 4.0–10.5)
nRBC: 0 % (ref 0.0–0.2)

## 2018-08-15 LAB — GLUCOSE, CAPILLARY
Glucose-Capillary: 93 mg/dL (ref 70–99)
Glucose-Capillary: 128 mg/dL — ABNORMAL HIGH (ref 70–99)
Glucose-Capillary: 142 mg/dL — ABNORMAL HIGH (ref 70–99)
Glucose-Capillary: 101 mg/dL — ABNORMAL HIGH (ref 70–99)
Glucose-Capillary: 108 mg/dL — ABNORMAL HIGH (ref 70–99)
Glucose-Capillary: 149 mg/dL — ABNORMAL HIGH (ref 70–99)

## 2018-08-15 NOTE — Op Note (Signed)
[facility] Patient Name: Christopher [name] Procedure Date : 08/14/2018 MRN: [id number] Attending MD: Violeta Gelinas , MD Date of Birth: September 03, 1965 CSN: [id number] Age: 53 Admit Type: Inpatient Procedure:                Upper GI endoscopy Indications:              Place PEG due to dysphagia Providers:                Violeta Gelinas, MD, Margaree Mackintosh, RN, Harrington Challenger, Technician Referring MD:              Medicines:                 Complications:            No immediate complications. Estimated Blood Loss:      Procedure:                Pre-Anesthesia Assessment:                           - Prior to the procedure, a History and Physical                            was performed, and patient medications and                            allergies were reviewed. The patient is unable to                            give consent secondary to the patient's altered                            mental status. The risks and benefits of the                            procedure and the sedation options and risks were                            discussed with the patient's partner. All questions                            were answered and informed consent was obtained.                            Patient identification and proposed procedure were                            verified by the physician, the nurse and the                            technician in the procedure room. Mental Status                            Examination: sedated.  Airway Examination: status                            post tracheostomy. After reviewing the risks and                            benefits, the patient was deemed in satisfactory                            condition to undergo the procedure. The anesthesia                            plan was to use moderate sedation / analgesia                            (conscious sedation). Immediately prior to     administration of medications, the patient was                            re-assessed for adequacy to receive sedatives. The                            heart rate, respiratory rate, oxygen saturations,                            blood pressure, adequacy of pulmonary ventilation,                            and response to care were monitored throughout the                            procedure. The physical status of the patient was                            re-assessed after the procedure.                           After obtaining informed consent, the endoscope was                            passed under direct vision. Throughout the                            procedure, the patient's blood pressure, pulse, and                            oxygen saturations were monitored continuously. The                            GIF-H190 ([id number]) Olympus gastroscope was                            introduced through the mouth, and advanced to the  duodenal bulb. The upper GI endoscopy was                            accomplished without difficulty. The patient                            tolerated the procedure well. Scope In: Scope Out: Findings:      No gross lesions were noted in the esophagus.      No gross lesions were noted in the stomach. Placement of an externally       removable PEG with no T-fasteners was successfully completed. The       external bumper was at the 3.0 cm marking on the tube. Estimated blood       loss: none. Impression:               - No specimens collected. Recommendation:           meds now, tube feeds in 4h Procedure Code(s):        --- Professional ---                           [phone], Esophagogastroduodenoscopy, flexible,                            transoral; with directed placement of percutaneous                            gastrostomy tube Diagnosis Code(s):        --- Professional ---                           R13.10, Dysphagia,  unspecified                           Z43.1, Encounter for attention to gastrostomy CPT copyright 2018 [employer]. All rights reserved. The codes documented in this report are preliminary and upon coder review may  be revised to meet current compliance requirements. Violeta GelinasBurke Bracha Frankowski, MD 08/14/2018 10:50:01 AM This report has been signed electronically. Number of Addenda: 0
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
## 2018-08-16 LAB — SURGICAL PCR SCREEN
MRSA, PCR: NEGATIVE
Staphylococcus aureus: NEGATIVE

## 2018-08-16 LAB — GLUCOSE, CAPILLARY
Glucose-Capillary: 102 mg/dL — ABNORMAL HIGH (ref 70–99)
Glucose-Capillary: 144 mg/dL — ABNORMAL HIGH (ref 70–99)
Glucose-Capillary: 137 mg/dL — ABNORMAL HIGH (ref 70–99)
Glucose-Capillary: 126 mg/dL — ABNORMAL HIGH (ref 70–99)
Glucose-Capillary: 86 mg/dL (ref 70–99)
Glucose-Capillary: 138 mg/dL — ABNORMAL HIGH (ref 70–99)

## 2018-08-16 MED ORDER — CLONIDINE HCL 0.1 MG PO TABS
0.3000 mg | ORAL_TABLET | Freq: Four times a day (QID) | ORAL | Status: DC
  Administered 2018-08-16 – 2018-08-17 (×4): 0.3 mg
  Filled 2018-08-16 (×3): qty 3

## 2018-08-16 MED ORDER — CLONIDINE HCL 0.1 MG PO TABS: 0.3000 mg | ORAL_TABLET | Freq: Four times a day (QID) | ORAL | Status: DC
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
## 2018-08-17 ENCOUNTER — Encounter (HOSPITAL_COMMUNITY): Payer: Self-pay | Admitting: General Surgery

## 2018-08-17 ENCOUNTER — Inpatient Hospital Stay (HOSPITAL_COMMUNITY): Payer: No Typology Code available for payment source

## 2018-08-17 LAB — CBC
HCT: 27.8 % — ABNORMAL LOW (ref 39.0–52.0)
Hemoglobin: 8.2 g/dL — ABNORMAL LOW (ref 13.0–17.0)
MCH: 30 pg (ref 26.0–34.0)
MCHC: 29.5 g/dL — ABNORMAL LOW (ref 30.0–36.0)
MCV: 101.8 fL — ABNORMAL HIGH (ref 80.0–100.0)
Platelets: 268 10*3/uL (ref 150–400)
RBC: 2.73 MIL/uL — ABNORMAL LOW (ref 4.22–5.81)
RDW: 16.3 % — ABNORMAL HIGH (ref 11.5–15.5)
WBC: 5.5 10*3/uL (ref 4.0–10.5)
nRBC: 0 % (ref 0.0–0.2)

## 2018-08-17 LAB — BASIC METABOLIC PANEL
Anion gap: 4 — ABNORMAL LOW (ref 5–15)
BUN: 20 mg/dL (ref 6–20)
CO2: 26 mmol/L (ref 22–32)
Calcium: 7.9 mg/dL — ABNORMAL LOW (ref 8.9–10.3)
Chloride: 107 mmol/L (ref 98–111)
Creatinine, Ser: <0.3 mg/dL — ABNORMAL LOW (ref 0.61–1.24)
Glucose, Bld: 158 mg/dL — ABNORMAL HIGH (ref 70–99)
Potassium: 3.5 mmol/L (ref 3.5–5.1)
Sodium: 137 mmol/L (ref 135–145)

## 2018-08-17 LAB — GLUCOSE, CAPILLARY
Glucose-Capillary: 134 mg/dL — ABNORMAL HIGH (ref 70–99)
Glucose-Capillary: 125 mg/dL — ABNORMAL HIGH (ref 70–99)
Glucose-Capillary: 129 mg/dL — ABNORMAL HIGH (ref 70–99)
Glucose-Capillary: 123 mg/dL — ABNORMAL HIGH (ref 70–99)
Glucose-Capillary: 166 mg/dL — ABNORMAL HIGH (ref 70–99)

## 2018-08-17 MED ORDER — DIPHENHYDRAMINE HCL 50 MG/ML IJ SOLN
25.0000 mg | Freq: Once | INTRAMUSCULAR | Status: AC
  Administered 2018-08-17: 25 mg via INTRAVENOUS
  Filled 2018-08-17: qty 1

## 2018-08-17 MED ORDER — QUETIAPINE FUMARATE 200 MG PO TABS
200.0000 mg | ORAL_TABLET | Freq: Two times a day (BID) | ORAL | Status: DC
  Administered 2018-08-17 – 2018-08-23 (×14): 200 mg
  Filled 2018-08-17 (×14): qty 1

## 2018-08-17 MED ORDER — FUROSEMIDE 10 MG/ML IJ SOLN
80.0000 mg | Freq: Once | INTRAMUSCULAR | Status: AC
  Administered 2018-08-17: 80 mg via INTRAVENOUS
  Filled 2018-08-17: qty 8
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
## 2018-08-18 LAB — CULTURE, BLOOD (ROUTINE X 2)
Culture: NO GROWTH
Culture: NO GROWTH

## 2018-08-18 LAB — CBC
HCT: 27.3 % — ABNORMAL LOW (ref 39.0–52.0)
Hemoglobin: 8 g/dL — ABNORMAL LOW (ref 13.0–17.0)
MCH: 29.9 pg (ref 26.0–34.0)
MCHC: 29.3 g/dL — ABNORMAL LOW (ref 30.0–36.0)
MCV: 101.9 fL — ABNORMAL HIGH (ref 80.0–100.0)
Platelets: 280 10*3/uL (ref 150–400)
RBC: 2.68 MIL/uL — ABNORMAL LOW (ref 4.22–5.81)
RDW: 16.1 % — ABNORMAL HIGH (ref 11.5–15.5)
WBC: 5.5 10*3/uL (ref 4.0–10.5)
nRBC: 0 % (ref 0.0–0.2)

## 2018-08-18 LAB — BASIC METABOLIC PANEL
Anion gap: 4 — ABNORMAL LOW (ref 5–15)
BUN: 24 mg/dL — ABNORMAL HIGH (ref 6–20)
CO2: 27 mmol/L (ref 22–32)
Calcium: 8.2 mg/dL — ABNORMAL LOW (ref 8.9–10.3)
Chloride: 109 mmol/L (ref 98–111)
Creatinine, Ser: 0.32 mg/dL — ABNORMAL LOW (ref 0.61–1.24)
GFR calc Af Amer: >60 mL/min (ref >60)
GFR calc non Af Amer: >60 mL/min (ref >60)
Glucose, Bld: 134 mg/dL — ABNORMAL HIGH (ref 70–99)
Potassium: 3.8 mmol/L (ref 3.5–5.1)
Sodium: 140 mmol/L (ref 135–145)

## 2018-08-18 LAB — GLUCOSE, CAPILLARY
Glucose-Capillary: 119 mg/dL — ABNORMAL HIGH (ref 70–99)
Glucose-Capillary: 130 mg/dL — ABNORMAL HIGH (ref 70–99)
Glucose-Capillary: 123 mg/dL — ABNORMAL HIGH (ref 70–99)
Glucose-Capillary: 143 mg/dL — ABNORMAL HIGH (ref 70–99)
Glucose-Capillary: 120 mg/dL — ABNORMAL HIGH (ref 70–99)
Glucose-Capillary: 107 mg/dL — ABNORMAL HIGH (ref 70–99)
Glucose-Capillary: 134 mg/dL — ABNORMAL HIGH (ref 70–99)
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
## 2018-08-19 ENCOUNTER — Inpatient Hospital Stay (HOSPITAL_COMMUNITY): Payer: No Typology Code available for payment source

## 2018-08-19 LAB — BLOOD CULTURE ID PANEL (REFLEXED)
Acinetobacter baumannii: NOT DETECTED
Candida albicans: NOT DETECTED
Candida glabrata: NOT DETECTED
Candida krusei: NOT DETECTED
Candida parapsilosis: NOT DETECTED
Candida tropicalis: NOT DETECTED
Carbapenem resistance: NOT DETECTED
Enterobacter cloacae complex: NOT DETECTED
Enterobacteriaceae species: DETECTED — AB
Enterococcus species: NOT DETECTED
Escherichia coli: NOT DETECTED
Haemophilus influenzae: NOT DETECTED
Klebsiella oxytoca: NOT DETECTED
Klebsiella pneumoniae: NOT DETECTED
Listeria monocytogenes: NOT DETECTED
Neisseria meningitidis: NOT DETECTED
Proteus species: NOT DETECTED
Pseudomonas aeruginosa: NOT DETECTED
Serratia marcescens: DETECTED — AB
Staphylococcus aureus (BCID): NOT DETECTED
Staphylococcus species: NOT DETECTED
Streptococcus agalactiae: NOT DETECTED
Streptococcus pneumoniae: NOT DETECTED
Streptococcus pyogenes: NOT DETECTED
Streptococcus species: NOT DETECTED

## 2018-08-19 LAB — GLUCOSE, CAPILLARY
Glucose-Capillary: 105 mg/dL — ABNORMAL HIGH (ref 70–99)
Glucose-Capillary: 142 mg/dL — ABNORMAL HIGH (ref 70–99)
Glucose-Capillary: 146 mg/dL — ABNORMAL HIGH (ref 70–99)
Glucose-Capillary: 131 mg/dL — ABNORMAL HIGH (ref 70–99)
Glucose-Capillary: 107 mg/dL — ABNORMAL HIGH (ref 70–99)
Glucose-Capillary: 144 mg/dL — ABNORMAL HIGH (ref 70–99)

## 2018-08-19 MED ORDER — SODIUM CHLORIDE 0.9 % IV SOLN
2.0000 g | INTRAVENOUS | Status: DC
  Administered 2018-08-19 – 2018-08-21 (×3): 2 g via INTRAVENOUS
  Filled 2018-08-19 (×3): qty 20
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
## 2018-08-20 LAB — CBC
HCT: 27.3 % — ABNORMAL LOW (ref 39.0–52.0)
Hemoglobin: 7.6 g/dL — ABNORMAL LOW (ref 13.0–17.0)
MCH: 29 pg (ref 26.0–34.0)
MCHC: 27.8 g/dL — ABNORMAL LOW (ref 30.0–36.0)
MCV: 104.2 fL — ABNORMAL HIGH (ref 80.0–100.0)
Platelets: 263 10*3/uL (ref 150–400)
RBC: 2.62 MIL/uL — ABNORMAL LOW (ref 4.22–5.81)
RDW: 16.2 % — ABNORMAL HIGH (ref 11.5–15.5)
WBC: 5.7 10*3/uL (ref 4.0–10.5)
nRBC: 0 % (ref 0.0–0.2)

## 2018-08-20 LAB — GLUCOSE, CAPILLARY
Glucose-Capillary: 125 mg/dL — ABNORMAL HIGH (ref 70–99)
Glucose-Capillary: 122 mg/dL — ABNORMAL HIGH (ref 70–99)
Glucose-Capillary: 108 mg/dL — ABNORMAL HIGH (ref 70–99)
Glucose-Capillary: 106 mg/dL — ABNORMAL HIGH (ref 70–99)
Glucose-Capillary: 106 mg/dL — ABNORMAL HIGH (ref 70–99)
Glucose-Capillary: 136 mg/dL — ABNORMAL HIGH (ref 70–99)

## 2018-08-20 LAB — BASIC METABOLIC PANEL
Anion gap: 5 (ref 5–15)
BUN: 27 mg/dL — ABNORMAL HIGH (ref 6–20)
CO2: 27 mmol/L (ref 22–32)
Calcium: 8.1 mg/dL — ABNORMAL LOW (ref 8.9–10.3)
Chloride: 109 mmol/L (ref 98–111)
Creatinine, Ser: 0.31 mg/dL — ABNORMAL LOW (ref 0.61–1.24)
GFR calc Af Amer: >60 mL/min (ref >60)
GFR calc non Af Amer: >60 mL/min (ref >60)
Glucose, Bld: 146 mg/dL — ABNORMAL HIGH (ref 70–99)
Potassium: 3.6 mmol/L (ref 3.5–5.1)
Sodium: 141 mmol/L (ref 135–145)

## 2018-08-20 LAB — MAGNESIUM: Magnesium: 1.8 mg/dL (ref 1.7–2.4)

## 2018-08-20 LAB — PHOSPHORUS: Phosphorus: 2.2 mg/dL — ABNORMAL LOW (ref 2.5–4.6)

## 2018-08-20 MED ORDER — CLONAZEPAM 1 MG PO TABS
2.0000 mg | ORAL_TABLET | Freq: Two times a day (BID) | ORAL | Status: DC
  Administered 2018-08-20 – 2018-08-23 (×7): 2 mg
  Filled 2018-08-20 (×7): qty 2

## 2018-08-20 NOTE — H&P (View-Only) (Signed)
Subjective: Patient resting in bed and just received pain meds for his turn.  Objective: Vital signs in last 24 hours: Temp:  [98.2 F (36.8 C)-101.3 F (38.5 C)] 100.2 F (37.9 C) (03/09 1600) Pulse Rate:  [76-142] 120 (03/09 1924) Resp:  [17-33] 19 (03/09 1924) BP: (97-183)/(53-100) 121/63 (03/09 1924) SpO2:  [91 %-100 %] 100 % (03/09 1924) FiO2 (%):  [40 %] 40 % (03/09 1924) Weight:  [80.5 kg] 80.5 kg (03/09 0300) Weight change: -3.8 kg Last BM Date: 08/20/18(ostomy with current output)  Intake/Output from previous day: 03/08 0701 - 03/09 0700 In: 3239.9 [I.V.:1299.9; NG/GT:1840; IV Piggyback:100] Out: 2049 [Urine:1699; Stool:350] Intake/Output this shift: No intake/output data recorded. General appearance: resting Wounds: dressings in place and clean.  Overall wound care excellent.   Lab Results: Recent Labs    08/18/18 0457 08/20/18 0514  WBC 5.5 5.7  HGB 8.0* 7.6*  HCT 27.3* 27.3*  PLT 280 263   BMET Recent Labs    08/18/18 0457 08/20/18 0514  NA 140 141  K 3.8 3.6  CL 109 109  CO2 27 27  GLUCOSE 134* 146*  BUN 24* 27*  CREATININE 0.32* 0.31*  CALCIUM 8.2* 8.1*    Studies/Results: Dg Chest Port 1 View  Result Date: 08/19/2018 CLINICAL DATA:  Abnormal auditory perception EXAM: PORTABLE CHEST 1 VIEW COMPARISON:  August 17, 2018 FINDINGS: Stable tracheostomy tube and left PICC line. Stable effusion and underlying opacity in the right base. The cardiomediastinal silhouette is stable. No other changes. IMPRESSION: 1. Support apparatus as above. 2. Stable effusion and underlying opacity in the right base. No other change. Electronically Signed   By: David  Williams III M.D   On: 08/19/2018 21:23    Medications: I have reviewed the patient's current medications.  Assessment/Plan: Continue current treatment for wounds.  Maximize nutritional status as able.  Plan OR for next week for possible Acell.  Hope for skin graft in next 3-4 weeks.  LOS: 41 days    Ermie Glendenning S Anoop Hemmer 08/20/2018, 7:38 PM  
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
## 2018-08-20 NOTE — Progress Notes (Signed)
All wounds dressed per order instruction.

## 2018-08-20 NOTE — Progress Notes (Signed)
Subjective: Patient resting in bed and just received pain meds for his turn.  Objective: Vital signs in last 24 hours: Temp:  [98.2 F (36.8 C)-101.3 F (38.5 C)] 100.2 F (37.9 C) (03/09 1600) Pulse Rate:  [76-142] 120 (03/09 1924) Resp:  [17-33] 19 (03/09 1924) BP: (97-183)/(53-100) 121/63 (03/09 1924) SpO2:  [91 %-100 %] 100 % (03/09 1924) FiO2 (%):  [40 %] 40 % (03/09 1924) Weight:  [80.5 kg] 80.5 kg (03/09 0300) Weight change: -3.8 kg Last BM Date: 08/20/18(ostomy with current output)  Intake/Output from previous day: 03/08 0701 - 03/09 0700 In: 3239.9 [I.V.:1299.9; NG/GT:1840; IV Piggyback:100] Out: 2049 [Urine:1699; Stool:350] Intake/Output this shift: No intake/output data recorded. General appearance: resting Wounds: dressings in place and clean.  Overall wound care excellent.   Lab Results: Recent Labs    08/18/18 0457 08/20/18 0514  WBC 5.5 5.7  HGB 8.0* 7.6*  HCT 27.3* 27.3*  PLT 280 263   BMET Recent Labs    08/18/18 0457 08/20/18 0514  NA 140 141  K 3.8 3.6  CL 109 109  CO2 27 27  GLUCOSE 134* 146*  BUN 24* 27*  CREATININE 0.32* 0.31*  CALCIUM 8.2* 8.1*    Studies/Results: Dg Chest Port 1 View  Result Date: 08/19/2018 CLINICAL DATA:  Abnormal auditory perception EXAM: PORTABLE CHEST 1 VIEW COMPARISON:  August 17, 2018 FINDINGS: Stable tracheostomy tube and left PICC line. Stable effusion and underlying opacity in the right base. The cardiomediastinal silhouette is stable. No other changes. IMPRESSION: 1. Support apparatus as above. 2. Stable effusion and underlying opacity in the right base. No other change. Electronically Signed   By: Gerome Sam III M.D   On: 08/19/2018 21:23    Medications: I have reviewed the patient's current medications.  Assessment/Plan: Continue current treatment for wounds.  Maximize nutritional status as able.  Plan OR for next week for possible Acell.  Hope for skin graft in next 3-4 weeks.  LOS: 41 days    Peggye Form 08/20/2018, 7:38 PM

## 2018-08-21 LAB — CBC
HCT: 27.4 % — ABNORMAL LOW (ref 39.0–52.0)
Hemoglobin: 8 g/dL — ABNORMAL LOW (ref 13.0–17.0)
MCH: 30.1 pg (ref 26.0–34.0)
MCHC: 29.2 g/dL — ABNORMAL LOW (ref 30.0–36.0)
MCV: 103 fL — ABNORMAL HIGH (ref 80.0–100.0)
Platelets: 303 10*3/uL (ref 150–400)
RBC: 2.66 MIL/uL — ABNORMAL LOW (ref 4.22–5.81)
RDW: 16.4 % — ABNORMAL HIGH (ref 11.5–15.5)
WBC: 5.6 10*3/uL (ref 4.0–10.5)
nRBC: 0 % (ref 0.0–0.2)

## 2018-08-21 LAB — BASIC METABOLIC PANEL
Anion gap: 6 (ref 5–15)
BUN: 24 mg/dL — ABNORMAL HIGH (ref 6–20)
CO2: 26 mmol/L (ref 22–32)
Calcium: 8.1 mg/dL — ABNORMAL LOW (ref 8.9–10.3)
Chloride: 108 mmol/L (ref 98–111)
Creatinine, Ser: <0.3 mg/dL — ABNORMAL LOW (ref 0.61–1.24)
Glucose, Bld: 129 mg/dL — ABNORMAL HIGH (ref 70–99)
Potassium: 3.5 mmol/L (ref 3.5–5.1)
Sodium: 140 mmol/L (ref 135–145)

## 2018-08-21 LAB — GLUCOSE, CAPILLARY
Glucose-Capillary: 108 mg/dL — ABNORMAL HIGH (ref 70–99)
Glucose-Capillary: 108 mg/dL — ABNORMAL HIGH (ref 70–99)
Glucose-Capillary: 132 mg/dL — ABNORMAL HIGH (ref 70–99)
Glucose-Capillary: 114 mg/dL — ABNORMAL HIGH (ref 70–99)
Glucose-Capillary: 104 mg/dL — ABNORMAL HIGH (ref 70–99)
Glucose-Capillary: 133 mg/dL — ABNORMAL HIGH (ref 70–99)

## 2018-08-21 MED ORDER — SODIUM CHLORIDE 0.9 % IV SOLN
1.0000 g | Freq: Three times a day (TID) | INTRAVENOUS | Status: DC
  Administered 2018-08-21 – 2018-08-22 (×3): 1 g via INTRAVENOUS
  Filled 2018-08-21 (×4): qty 1
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
## 2018-08-21 NOTE — Consult Note (Signed)
WOC Nurse ostomy follow up Patient receiving care in [facility] 4N18.  No family present. Pouch changed yesterday according to primary RN.  I helped the primary RN find the order listing the ostomy supplies [facility] numbers. Stoma type/location: LUQ Stomal assessment/size: deferred; budded, moist, dark pink, producing stool Peristomal assessment: deferred Treatment options for stomal/peristomal skin: barrier ring Output: drops of stool on stoma Ostomy pouching: 2pc.  Education provided: None, patient unteachable Enrolled patient in [facility] [employer] Start Discharge program: No
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
## 2018-08-22 LAB — GLUCOSE, CAPILLARY
Glucose-Capillary: 134 mg/dL — ABNORMAL HIGH (ref 70–99)
Glucose-Capillary: 93 mg/dL (ref 70–99)
Glucose-Capillary: 115 mg/dL — ABNORMAL HIGH (ref 70–99)
Glucose-Capillary: 147 mg/dL — ABNORMAL HIGH (ref 70–99)
Glucose-Capillary: 137 mg/dL — ABNORMAL HIGH (ref 70–99)
Glucose-Capillary: 119 mg/dL — ABNORMAL HIGH (ref 70–99)

## 2018-08-22 LAB — CULTURE, BLOOD (ROUTINE X 2): Special Requests: ADEQUATE

## 2018-08-22 LAB — PREALBUMIN: Prealbumin: 6.5 mg/dL — ABNORMAL LOW (ref 18–38)

## 2018-08-22 MED ORDER — SODIUM CHLORIDE 0.9 % IV SOLN
2.0000 g | Freq: Three times a day (TID) | INTRAVENOUS | Status: DC
  Filled 2018-08-22: qty 2

## 2018-08-22 MED ORDER — FUROSEMIDE 10 MG/ML IJ SOLN
40.0000 mg | Freq: Once | INTRAMUSCULAR | Status: AC
  Administered 2018-08-22: 40 mg via INTRAVENOUS
  Filled 2018-08-22: qty 4

## 2018-08-22 MED ORDER — SODIUM CHLORIDE 0.9 % IV SOLN
2.0000 g | Freq: Three times a day (TID) | INTRAVENOUS | Status: DC
  Administered 2018-08-22 – 2018-08-23 (×3): 2 g via INTRAVENOUS
  Filled 2018-08-22 (×5): qty 2
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
## 2018-08-23 LAB — CULTURE, BLOOD (ROUTINE X 2)
Culture: NO GROWTH
Special Requests: ADEQUATE

## 2018-08-23 LAB — GLUCOSE, CAPILLARY
Glucose-Capillary: 98 mg/dL (ref 70–99)
Glucose-Capillary: 126 mg/dL — ABNORMAL HIGH (ref 70–99)
Glucose-Capillary: 136 mg/dL — ABNORMAL HIGH (ref 70–99)
Glucose-Capillary: 143 mg/dL — ABNORMAL HIGH (ref 70–99)
Glucose-Capillary: 140 mg/dL — ABNORMAL HIGH (ref 70–99)
Glucose-Capillary: 112 mg/dL — ABNORMAL HIGH (ref 70–99)

## 2018-08-23 LAB — BASIC METABOLIC PANEL
Anion gap: 5 (ref 5–15)
BUN: 28 mg/dL — ABNORMAL HIGH (ref 6–20)
CO2: 29 mmol/L (ref 22–32)
Calcium: 8.5 mg/dL — ABNORMAL LOW (ref 8.9–10.3)
Chloride: 109 mmol/L (ref 98–111)
Creatinine, Ser: <0.3 mg/dL — ABNORMAL LOW (ref 0.61–1.24)
Glucose, Bld: 140 mg/dL — ABNORMAL HIGH (ref 70–99)
Potassium: 4 mmol/L (ref 3.5–5.1)
Sodium: 143 mmol/L (ref 135–145)

## 2018-08-23 LAB — CBC
HCT: 27.1 % — ABNORMAL LOW (ref 39.0–52.0)
Hemoglobin: 7.9 g/dL — ABNORMAL LOW (ref 13.0–17.0)
MCH: 30 pg (ref 26.0–34.0)
MCHC: 29.2 g/dL — ABNORMAL LOW (ref 30.0–36.0)
MCV: 103 fL — ABNORMAL HIGH (ref 80.0–100.0)
Platelets: 331 10*3/uL (ref 150–400)
RBC: 2.63 MIL/uL — ABNORMAL LOW (ref 4.22–5.81)
RDW: 16.1 % — ABNORMAL HIGH (ref 11.5–15.5)
WBC: 6.5 10*3/uL (ref 4.0–10.5)
nRBC: 0 % (ref 0.0–0.2)

## 2018-08-23 MED ORDER — SODIUM CHLORIDE 0.9 % IV SOLN
1.0000 g | Freq: Three times a day (TID) | INTRAVENOUS | Status: AC
  Administered 2018-08-23 – 2018-09-05 (×40): 1 g via INTRAVENOUS
  Filled 2018-08-23 (×40): qty 1

## 2018-08-23 MED ORDER — FUROSEMIDE 10 MG/ML IJ SOLN
40.0000 mg | Freq: Once | INTRAMUSCULAR | Status: AC
  Administered 2018-08-23: 40 mg via INTRAVENOUS
  Filled 2018-08-23: qty 4

## 2018-08-23 MED ORDER — CLONAZEPAM 1 MG PO TABS
2.0000 mg | ORAL_TABLET | Freq: Three times a day (TID) | ORAL | Status: DC
  Administered 2018-08-23 – 2018-10-16 (×159): 2 mg
  Filled 2018-08-23 (×159): qty 2
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
## 2018-08-23 NOTE — Progress Notes (Signed)
PHARMACY NOTE:  ANTIMICROBIAL RENAL DOSAGE ADJUSTMENT  Current antimicrobial regimen includes a mismatch between antimicrobial dosage and estimated renal function.  As per policy approved by the Pharmacy & Therapeutics and Medical Executive Committees, the antimicrobial dosage will be adjusted accordingly.  Current antimicrobial dosage:  Meropenem 2 gm every 8 hours  Indication: Bacteremia  Renal Function:  CrCl cannot be calculated (This lab value cannot be used to calculate CrCl because it is not a number: <0.30). []      On intermittent HD, scheduled: []      On CRRT    Antimicrobial dosage has been changed to:  Meropenem 1 gm every 8 hours   Additional comments:Bacteremia dosing    Thank you for allowing pharmacy to be a part of this patient's care.  Sharin Mons, PharmD, BCPS, BCIDP Infectious Diseases Clinical Pharmacist Phone: [phone] 08/23/2018 11:21 AM

## 2018-08-24 ENCOUNTER — Other Ambulatory Visit: Payer: Self-pay

## 2018-08-24 ENCOUNTER — Inpatient Hospital Stay (HOSPITAL_COMMUNITY): Payer: No Typology Code available for payment source

## 2018-08-24 LAB — GLUCOSE, CAPILLARY
Glucose-Capillary: 97 mg/dL (ref 70–99)
Glucose-Capillary: 142 mg/dL — ABNORMAL HIGH (ref 70–99)
Glucose-Capillary: 115 mg/dL — ABNORMAL HIGH (ref 70–99)
Glucose-Capillary: 99 mg/dL (ref 70–99)
Glucose-Capillary: 109 mg/dL — ABNORMAL HIGH (ref 70–99)

## 2018-08-24 MED ORDER — FUROSEMIDE 10 MG/ML IJ SOLN
40.0000 mg | Freq: Once | INTRAMUSCULAR | Status: AC
  Administered 2018-08-24: 40 mg via INTRAVENOUS
  Filled 2018-08-24: qty 4

## 2018-08-24 MED ORDER — FENTANYL 100 MCG/HR TD PT72: 1.0000 | MEDICATED_PATCH | TRANSDERMAL | Status: DC

## 2018-08-24 MED ORDER — QUETIAPINE FUMARATE 200 MG PO TABS
200.0000 mg | ORAL_TABLET | Freq: Three times a day (TID) | ORAL | Status: DC
  Administered 2018-08-24 – 2018-10-16 (×154): 200 mg
  Filled 2018-08-24 (×100): qty 1
  Filled 2018-08-24: qty 2
  Filled 2018-08-24 (×53): qty 1

## 2018-08-24 MED ORDER — OXYCODONE HCL 5 MG PO TABS
10.0000 mg | ORAL_TABLET | ORAL | Status: DC
  Administered 2018-08-24 – 2018-08-27 (×17): 10 mg
  Filled 2018-08-24 (×17): qty 2
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
## 2018-08-24 NOTE — Progress Notes (Addendum)
SLP Cancellation Note  Patient Details Name: Christopher [name] MRN: [id number] DOB: [birth year]   Cancelled treatment:       Reason Eval/Treat Not Completed: Medical issues which prohibited therapy. Discussed potential for inline vent Passy-Muir valve with RT and RN. RN reported pt was alert and following commands however   [name] 08/24/2018, 1:06 PM  [name].Ed Nurse, children's [phone] Office [phone]
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
## 2018-08-25 ENCOUNTER — Inpatient Hospital Stay (HOSPITAL_COMMUNITY): Payer: No Typology Code available for payment source

## 2018-08-25 LAB — TROPONIN I
Troponin I: <0.03 ng/mL (ref <0.03)
Troponin I: <0.03 ng/mL (ref <0.03)
Troponin I: <0.03 ng/mL (ref <0.03)

## 2018-08-25 LAB — GLUCOSE, CAPILLARY
Glucose-Capillary: 111 mg/dL — ABNORMAL HIGH (ref 70–99)
Glucose-Capillary: 113 mg/dL — ABNORMAL HIGH (ref 70–99)
Glucose-Capillary: 119 mg/dL — ABNORMAL HIGH (ref 70–99)
Glucose-Capillary: 126 mg/dL — ABNORMAL HIGH (ref 70–99)
Glucose-Capillary: 135 mg/dL — ABNORMAL HIGH (ref 70–99)
Glucose-Capillary: 140 mg/dL — ABNORMAL HIGH (ref 70–99)
Glucose-Capillary: 85 mg/dL (ref 70–99)

## 2018-08-25 LAB — CBC
HCT: 27.1 % — ABNORMAL LOW (ref 39.0–52.0)
Hemoglobin: 7.8 g/dL — ABNORMAL LOW (ref 13.0–17.0)
MCH: 29.7 pg (ref 26.0–34.0)
MCHC: 28.8 g/dL — ABNORMAL LOW (ref 30.0–36.0)
MCV: 103 fL — ABNORMAL HIGH (ref 80.0–100.0)
Platelets: 242 10*3/uL (ref 150–400)
RBC: 2.63 MIL/uL — ABNORMAL LOW (ref 4.22–5.81)
RDW: 16 % — ABNORMAL HIGH (ref 11.5–15.5)
WBC: 6.9 10*3/uL (ref 4.0–10.5)
nRBC: 0 % (ref 0.0–0.2)

## 2018-08-25 LAB — BASIC METABOLIC PANEL
Anion gap: 6 (ref 5–15)
BUN: 30 mg/dL — ABNORMAL HIGH (ref 6–20)
CO2: 27 mmol/L (ref 22–32)
Calcium: 8.9 mg/dL (ref 8.9–10.3)
Chloride: 106 mmol/L (ref 98–111)
Creatinine, Ser: <0.3 mg/dL — ABNORMAL LOW (ref 0.61–1.24)
Glucose, Bld: 171 mg/dL — ABNORMAL HIGH (ref 70–99)
Potassium: 4.1 mmol/L (ref 3.5–5.1)
Sodium: 139 mmol/L (ref 135–145)

## 2018-08-25 MED ORDER — SODIUM CHLORIDE 0.9 % IV BOLUS
1000.0000 mL | Freq: Once | INTRAVENOUS | Status: AC
  Administered 2018-08-25: 500 mL via INTRAVENOUS

## 2018-08-25 MED ORDER — ONDANSETRON HCL 4 MG/2ML IJ SOLN
INTRAMUSCULAR | Status: AC
  Administered 2018-08-25: 4 mg
  Filled 2018-08-25: qty 2

## 2018-08-25 MED ORDER — ONDANSETRON HCL 4 MG/2ML IJ SOLN
4.0000 mg | Freq: Four times a day (QID) | INTRAMUSCULAR | Status: DC | PRN
  Administered 2018-08-25 – 2018-10-03 (×15): 4 mg via INTRAVENOUS
  Filled 2018-08-25 (×15): qty 2

## 2018-08-25 NOTE — Progress Notes (Signed)
Patient's HR elevated up to 190s after dressing change and bath, pain medication and prn metoprolol given. HR decreased but sustaining 140s with soft BP. Paged Dr. Cliffton Asters. EKG, labs and 1 L bolus ordered. EKG done and Dr. Cliffton Asters updated. Patient self converted to NSR in 80s shortly after speaking with MD.

## 2018-08-25 NOTE — Progress Notes (Signed)
Reported that earlier in the day, patient had emesis after oral care. After gentle oral care, without any deep suctioning, patient had large amt of emesis (approx. 400 ml). Tube feeds were turned off and PEG tube set up to gravity. Zofran given. Paged Trauma MD. Awaiting further orders.

## 2018-08-25 NOTE — Progress Notes (Signed)
Dr. Cliffton Asters came to patient's room. While 1st 1L bolus running, clarified if it should continue, since patient was given lasix earlier. MD gave verbal to only give total of 500 mL.
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
## 2018-08-26 ENCOUNTER — Inpatient Hospital Stay (HOSPITAL_COMMUNITY): Payer: No Typology Code available for payment source

## 2018-08-26 LAB — GLUCOSE, CAPILLARY
Glucose-Capillary: 95 mg/dL (ref 70–99)
Glucose-Capillary: 112 mg/dL — ABNORMAL HIGH (ref 70–99)
Glucose-Capillary: 116 mg/dL — ABNORMAL HIGH (ref 70–99)
Glucose-Capillary: 116 mg/dL — ABNORMAL HIGH (ref 70–99)
Glucose-Capillary: 96 mg/dL (ref 70–99)

## 2018-08-26 LAB — CBC
HCT: 27.9 % — ABNORMAL LOW (ref 39.0–52.0)
Hemoglobin: 8.1 g/dL — ABNORMAL LOW (ref 13.0–17.0)
MCH: 30.2 pg (ref 26.0–34.0)
MCHC: 29 g/dL — ABNORMAL LOW (ref 30.0–36.0)
MCV: 104.1 fL — ABNORMAL HIGH (ref 80.0–100.0)
Platelets: 367 10*3/uL (ref 150–400)
RBC: 2.68 MIL/uL — ABNORMAL LOW (ref 4.22–5.81)
RDW: 16.7 % — ABNORMAL HIGH (ref 11.5–15.5)
WBC: 9.3 10*3/uL (ref 4.0–10.5)
nRBC: 0 % (ref 0.0–0.2)

## 2018-08-26 MED ORDER — DEXTROSE-NACL 5-0.9 % IV SOLN
INTRAVENOUS | Status: AC
  Administered 2018-08-26 (×2): via INTRAVENOUS

## 2018-08-26 NOTE — Progress Notes (Signed)
Dr. Derrell Lolling gave instructions to keep PEG to gravity and add D5NS @75  ml/hr. Have MD in morning reassess.
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
## 2018-08-27 ENCOUNTER — Inpatient Hospital Stay (HOSPITAL_COMMUNITY): Payer: No Typology Code available for payment source

## 2018-08-27 LAB — BASIC METABOLIC PANEL
Anion gap: 3 — ABNORMAL LOW (ref 5–15)
BUN: 14 mg/dL (ref 6–20)
CO2: 26 mmol/L (ref 22–32)
Calcium: 8.8 mg/dL — ABNORMAL LOW (ref 8.9–10.3)
Chloride: 113 mmol/L — ABNORMAL HIGH (ref 98–111)
Creatinine, Ser: 0.33 mg/dL — ABNORMAL LOW (ref 0.61–1.24)
GFR calc Af Amer: >60 mL/min (ref >60)
GFR calc non Af Amer: >60 mL/min (ref >60)
Glucose, Bld: 110 mg/dL — ABNORMAL HIGH (ref 70–99)
Potassium: 3.4 mmol/L — ABNORMAL LOW (ref 3.5–5.1)
Sodium: 142 mmol/L (ref 135–145)

## 2018-08-27 LAB — CBC WITH DIFFERENTIAL/PLATELET
Abs Immature Granulocytes: 0.07 10*3/uL (ref 0.00–0.07)
Basophils Absolute: 0 10*3/uL (ref 0.0–0.1)
Basophils Relative: 0 %
Eosinophils Absolute: 0.6 10*3/uL — ABNORMAL HIGH (ref 0.0–0.5)
Eosinophils Relative: 11 %
HCT: 24.7 % — ABNORMAL LOW (ref 39.0–52.0)
Hemoglobin: 7.4 g/dL — ABNORMAL LOW (ref 13.0–17.0)
Immature Granulocytes: 1 %
Lymphocytes Relative: 12 %
Lymphs Abs: 0.7 10*3/uL (ref 0.7–4.0)
MCH: 30.7 pg (ref 26.0–34.0)
MCHC: 30 g/dL (ref 30.0–36.0)
MCV: 102.5 fL — ABNORMAL HIGH (ref 80.0–100.0)
Monocytes Absolute: 0.5 10*3/uL (ref 0.1–1.0)
Monocytes Relative: 8 %
Neutro Abs: 4 10*3/uL (ref 1.7–7.7)
Neutrophils Relative %: 68 %
Platelets: 330 10*3/uL (ref 150–400)
RBC: 2.41 MIL/uL — ABNORMAL LOW (ref 4.22–5.81)
RDW: 16.5 % — ABNORMAL HIGH (ref 11.5–15.5)
WBC: 5.9 10*3/uL (ref 4.0–10.5)
nRBC: 0 % (ref 0.0–0.2)

## 2018-08-27 LAB — GLUCOSE, CAPILLARY
Glucose-Capillary: 111 mg/dL — ABNORMAL HIGH (ref 70–99)
Glucose-Capillary: 72 mg/dL (ref 70–99)
Glucose-Capillary: 72 mg/dL (ref 70–99)
Glucose-Capillary: 78 mg/dL (ref 70–99)
Glucose-Capillary: 88 mg/dL (ref 70–99)
Glucose-Capillary: 99 mg/dL (ref 70–99)
Glucose-Capillary: 99 mg/dL (ref 70–99)

## 2018-08-27 MED ORDER — DEXTROSE-NACL 5-0.9 % IV SOLN
INTRAVENOUS | Status: AC
  Administered 2018-08-27 (×2): via INTRAVENOUS

## 2018-08-27 MED ORDER — POTASSIUM CHLORIDE 20 MEQ/15ML (10%) PO SOLN
40.0000 meq | Freq: Once | ORAL | Status: AC
  Administered 2018-08-27: 40 meq
  Filled 2018-08-27: qty 30

## 2018-08-27 MED ORDER — PIVOT 1.5 CAL PO LIQD
1000.0000 mL | ORAL | Status: DC
  Administered 2018-08-27 – 2018-08-29 (×3): 1000 mL

## 2018-08-27 MED ORDER — FENTANYL CITRATE (PF) 2500 MCG/50ML IJ SOLN
0.0000 ug/h | Status: DC
  Administered 2018-08-27: 225 ug/h via INTRAVENOUS
  Administered 2018-08-28 – 2018-08-29 (×3): 200 ug/h via INTRAVENOUS
  Administered 2018-08-29 – 2018-08-31 (×3): 175 ug/h via INTRAVENOUS
  Administered 2018-09-01 – 2018-09-06 (×5): 150 ug/h via INTRAVENOUS
  Administered 2018-09-07: 175 ug/h via INTRAVENOUS
  Administered 2018-09-08: 200 ug/h via INTRAVENOUS
  Administered 2018-09-09 – 2018-09-13 (×4): 100 ug/h via INTRAVENOUS
  Filled 2018-08-27 (×13): qty 100
  Filled 2018-08-27: qty 80
  Filled 2018-08-27 (×3): qty 100

## 2018-08-27 NOTE — Progress Notes (Signed)
Patient vomited during his speech therapy session. RT, ST, and RN were at bedside. Tube feeding was stopped and MD was paged. Pt was cleaned up and all dressings were changed including new abdominal binder. PRN's were given beforehand. Order to D/C Oxy was given. Monitoring closely. Christopher [name], [place]rape, RN

## 2018-08-27 NOTE — Progress Notes (Signed)
Late entry vent check
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
## 2018-08-27 NOTE — Progress Notes (Signed)
Speech and RT attempted PMV trial. RT deflated cuff slowly and turned peep to 0. Patient tolerated well. Placed PMV inline through ventilator with patients trach. Patient tolerated well. Patient vomited after about 2 minutes. Patient was sitting up and suctioned out while this occurred. RN at bedside. Ended PMV trial and will continue another time. PEEP back to 5 and cuff was inflated to normal limits. RT will continue to monitor.
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
## 2018-08-28 ENCOUNTER — Inpatient Hospital Stay (HOSPITAL_COMMUNITY): Payer: No Typology Code available for payment source

## 2018-08-28 LAB — GLUCOSE, CAPILLARY
Glucose-Capillary: 101 mg/dL — ABNORMAL HIGH (ref 70–99)
Glucose-Capillary: 107 mg/dL — ABNORMAL HIGH (ref 70–99)
Glucose-Capillary: 66 mg/dL — ABNORMAL LOW (ref 70–99)
Glucose-Capillary: 76 mg/dL (ref 70–99)
Glucose-Capillary: 90 mg/dL (ref 70–99)
Glucose-Capillary: 97 mg/dL (ref 70–99)
Glucose-Capillary: 99 mg/dL (ref 70–99)

## 2018-08-28 LAB — BASIC METABOLIC PANEL
Anion gap: 6 (ref 5–15)
BUN: 17 mg/dL (ref 6–20)
CO2: 25 mmol/L (ref 22–32)
Calcium: 8.7 mg/dL — ABNORMAL LOW (ref 8.9–10.3)
Chloride: 108 mmol/L (ref 98–111)
Creatinine, Ser: <0.3 mg/dL — ABNORMAL LOW (ref 0.61–1.24)
Glucose, Bld: 102 mg/dL — ABNORMAL HIGH (ref 70–99)
Potassium: 5.7 mmol/L — ABNORMAL HIGH (ref 3.5–5.1)
Sodium: 139 mmol/L (ref 135–145)

## 2018-08-28 MED ORDER — DEXTROSE-NACL 5-0.9 % IV SOLN
INTRAVENOUS | Status: AC
  Administered 2018-08-28: 17:00:00 via INTRAVENOUS
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
## 2018-08-28 NOTE — Consult Note (Signed)
WOC Nurse ostomy follow up Stoma type/location: LUQ colostomy Stomal assessment/size: 1 and 5/8 inch slightly oval, budded, red, moist, os at center Peristomal assessment: intact with mild erythema from stool sitting on skin Treatment options for stomal/peristomal skin: skin barrier ring Output: liquid light brown effluent Ostomy pouching: 2pc. 2 and 3/4 inch flat pouching system with skin barrier ring Education provided: None. Patient is on vent in ICU. Scheduled for surgery tomorrow with Plastics. Enrolled patient in [place] Secure Start Discharge program: No   WOC nursing team will follow, and will remain available to this patient, the nursing and medical teams.   Thanks, Ladona Mow, MSN, RN, GNP, [place]  Pager# [phone]
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
## 2018-08-29 ENCOUNTER — Inpatient Hospital Stay (HOSPITAL_COMMUNITY): Payer: No Typology Code available for payment source | Admitting: Anesthesiology

## 2018-08-29 ENCOUNTER — Encounter (HOSPITAL_COMMUNITY)
Admission: EM | Disposition: A | Discharge status: Nursing Home (Includes ICF) | Payer: Self-pay | Source: Home / Self Care

## 2018-08-29 ENCOUNTER — Encounter (HOSPITAL_COMMUNITY): Payer: Self-pay | Admitting: Plastic Surgery

## 2018-08-29 DIAGNOSIS — S3130XA Unspecified open wound of scrotum and testes, initial encounter: Secondary | ICD-10-CM

## 2018-08-29 DIAGNOSIS — S31819A Unspecified open wound of right buttock, initial encounter: Secondary | ICD-10-CM

## 2018-08-29 DIAGNOSIS — S71102A Unspecified open wound, left thigh, initial encounter: Secondary | ICD-10-CM

## 2018-08-29 DIAGNOSIS — S31829A Unspecified open wound of left buttock, initial encounter: Secondary | ICD-10-CM

## 2018-08-29 HISTORY — PX: INCISION AND DRAINAGE OF WOUND: SHX1803

## 2018-08-29 LAB — GLUCOSE, CAPILLARY
Glucose-Capillary: 76 mg/dL (ref 70–99)
Glucose-Capillary: 103 mg/dL — ABNORMAL HIGH (ref 70–99)
Glucose-Capillary: 84 mg/dL (ref 70–99)
Glucose-Capillary: 94 mg/dL (ref 70–99)
Glucose-Capillary: 124 mg/dL — ABNORMAL HIGH (ref 70–99)

## 2018-08-29 SURGERY — IRRIGATION AND DEBRIDEMENT WOUND
Anesthesia: General | Site: Thigh | Laterality: Left

## 2018-08-29 MED ORDER — ROCURONIUM BROMIDE 50 MG/5ML IV SOSY
PREFILLED_SYRINGE | INTRAVENOUS | Status: AC
  Filled 2018-08-29: qty 5

## 2018-08-29 MED ORDER — ROCURONIUM BROMIDE 10 MG/ML (PF) SYRINGE
PREFILLED_SYRINGE | INTRAVENOUS | Status: DC | PRN
  Administered 2018-08-29 (×2): 50 mg via INTRAVENOUS

## 2018-08-29 MED ORDER — SODIUM CHLORIDE 0.9 % IV SOLN
INTRAVENOUS | Status: AC
  Filled 2018-08-29: qty 500000

## 2018-08-29 MED ORDER — SODIUM CHLORIDE 0.9 % IV SOLN
INTRAVENOUS | Status: DC | PRN
  Administered 2018-08-29: 500 mL

## 2018-08-29 SURGICAL SUPPLY — 56 items
APPLICATOR COTTON TIP 6IN STRL (MISCELLANEOUS) IMPLANT
BAG DECANTER FOR FLEXI CONT (MISCELLANEOUS) IMPLANT
BENZOIN TINCTURE PRP APPL 2/3 (GAUZE/BANDAGES/DRESSINGS) ×2 IMPLANT
BNDG GAUZE ELAST 4 BULKY (GAUZE/BANDAGES/DRESSINGS) ×5 IMPLANT
BUR EGG ELITE 4.0 (BURR) ×1 IMPLANT
BUR EGG ELITE 5.0 (BURR) ×1 IMPLANT
CANISTER SUCT 3000ML PPV (MISCELLANEOUS) ×2 IMPLANT
CONT SPEC 4OZ CLIKSEAL STRL BL (MISCELLANEOUS) IMPLANT
COVER SURGICAL LIGHT HANDLE (MISCELLANEOUS) ×2 IMPLANT
COVER WAND RF STERILE (DRAPES) ×2 IMPLANT
DRAPE HALF SHEET 40X57 (DRAPES) IMPLANT
DRAPE IMP U-DRAPE 54X76 (DRAPES) ×2 IMPLANT
DRAPE INCISE IOBAN 66X45 STRL (DRAPES) IMPLANT
DRAPE LAPAROSCOPIC ABDOMINAL (DRAPES) IMPLANT
DRAPE LAPAROTOMY 100X72 PEDS (DRAPES) ×2 IMPLANT
DRAPE ORTHO SPLIT 87X125 STRL (DRAPES) ×2 IMPLANT
DRESSING HYDROCOLLOID 4X4 (GAUZE/BANDAGES/DRESSINGS) ×2 IMPLANT
DRSG ADAPTIC 3X8 NADH LF (GAUZE/BANDAGES/DRESSINGS) IMPLANT
DRSG CUTIMED SORBACT 7X9 (GAUZE/BANDAGES/DRESSINGS) ×4 IMPLANT
DRSG PAD ABDOMINAL 8X10 ST (GAUZE/BANDAGES/DRESSINGS) IMPLANT
DRSG VAC ATS LRG SENSATRAC (GAUZE/BANDAGES/DRESSINGS) IMPLANT
DRSG VAC ATS MED SENSATRAC (GAUZE/BANDAGES/DRESSINGS) IMPLANT
DRSG VAC ATS SM SENSATRAC (GAUZE/BANDAGES/DRESSINGS) IMPLANT
ELECT CAUTERY BLADE 6.4 (BLADE) IMPLANT
ELECT REM PT RETURN 9FT ADLT (ELECTROSURGICAL) ×2
ELECTRODE REM PT RTRN 9FT ADLT (ELECTROSURGICAL) ×1 IMPLANT
GAUZE SPONGE 4X4 12PLY STRL (GAUZE/BANDAGES/DRESSINGS) ×3 IMPLANT
GLOVE BIO SURGEON STRL SZ 6.5 (GLOVE) ×2 IMPLANT
GOWN STRL REUS W/ TWL LRG LVL3 (GOWN DISPOSABLE) ×3 IMPLANT
GOWN STRL REUS W/TWL LRG LVL3 (GOWN DISPOSABLE) ×3
KIT BASIN OR (CUSTOM PROCEDURE TRAY) ×2 IMPLANT
KIT TURNOVER KIT B (KITS) ×2 IMPLANT
MATRIX SURG PERF 3LAYER 16X25 (Tissue) ×1 IMPLANT
MATRIX SURG PERF 3LAYER 16X35 (Tissue) ×2 IMPLANT
MATRIX WOUND 3-LAYER 10X15 (Tissue) ×3 IMPLANT
MATRIX WOUND 3-LAYER 5X5 (Tissue) ×2 IMPLANT
MICROMATRIX 1000MG (Tissue) ×18 IMPLANT
NDL HYPO 25GX1X1/2 BEV (NEEDLE) ×1 IMPLANT
NEEDLE HYPO 25GX1X1/2 BEV (NEEDLE) ×2 IMPLANT
NS IRRIG 1000ML POUR BTL (IV SOLUTION) ×2 IMPLANT
PACK GENERAL/GYN (CUSTOM PROCEDURE TRAY) ×2 IMPLANT
PACK UNIVERSAL I (CUSTOM PROCEDURE TRAY) ×2 IMPLANT
PAD ABD 8X10 STRL (GAUZE/BANDAGES/DRESSINGS) ×10 IMPLANT
PAD ARMBOARD 7.5X6 YLW CONV (MISCELLANEOUS) ×4 IMPLANT
SOLUTION PARTIC MCRMTRX 1000MG (Tissue) IMPLANT
SPONGE LAP 18X18 X RAY DECT (DISPOSABLE) ×1 IMPLANT
STAPLER VISISTAT 35W (STAPLE) ×2 IMPLANT
SURGILUBE 2OZ TUBE FLIPTOP (MISCELLANEOUS) ×14 IMPLANT
SUT MNCRL AB 4-0 PS2 18 (SUTURE) IMPLANT
SUT VIC AB 5-0 PS2 18 (SUTURE) ×23 IMPLANT
SWAB COLLECTION DEVICE MRSA (MISCELLANEOUS) IMPLANT
SWAB CULTURE ESWAB REG 1ML (MISCELLANEOUS) IMPLANT
SYR CONTROL 10ML LL (SYRINGE) ×2 IMPLANT
TAPE CLOTH SURG 4X10 WHT LF (GAUZE/BANDAGES/DRESSINGS) ×1 IMPLANT
TOWEL OR 17X26 10 PK STRL BLUE (TOWEL DISPOSABLE) ×2 IMPLANT
UNDERPAD 30X30 (UNDERPADS AND DIAPERS) ×2 IMPLANT
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
## 2018-08-29 NOTE — Transfer of Care (Signed)
Immediate Anesthesia Transfer of Care Note  Patient: Christopher [name]  Procedure(s) Performed: Debridement of buttock, scrotum and left leg, placement of acell and vac (Left Thigh)  Patient Location: ICU  Anesthesia Type:General  Level of Consciousness: sedated  Airway & Oxygen Therapy: Patient connected to tracheostomy mask oxygen  Post-op Assessment: Report given to RN and Post -op Vital signs reviewed and stable  Post vital signs: Reviewed and stable  Last Vitals:  Vitals Value Taken Time  BP    Temp    Pulse 113 08/29/2018  5:25 PM  Resp 21 08/29/2018  5:25 PM  SpO2 99 % 08/29/2018  5:25 PM  Vitals shown include unvalidated device data.  Last Pain:  Vitals:   08/29/18 1200  TempSrc: Axillary  PainSc:          Complications: No apparent anesthesia complications

## 2018-08-29 NOTE — Anesthesia Postprocedure Evaluation (Signed)
Anesthesia Post Note  Patient: Manus Bergthold Merritts  Procedure(s) Performed: Debridement of buttock, scrotum and left leg, placement of acell and vac (Left Thigh)     Patient location during evaluation: NICU Anesthesia Type: General Level of consciousness: sedated Pain management: pain level controlled Vital Signs Assessment: post-procedure vital signs reviewed and stable Respiratory status: respiratory function stable, patient connected to tracheostomy mask oxygen and patient on ventilator - see flowsheet for VS Cardiovascular status: blood pressure returned to baseline and stable Postop Assessment: no apparent nausea or vomiting Anesthetic complications: no    Last Vitals:  Vitals:   08/29/18 1900 08/29/18 1955  BP: 125/76   Pulse: (!) 112   Resp: 17   Temp:    SpO2: 100% 100%    Last Pain:  Vitals:   08/29/18 1200  TempSrc: Axillary  PainSc:                  Beauregard Jarrells COKER

## 2018-08-29 NOTE — Progress Notes (Signed)
OT Cancellation Note  Patient Details Name: Donel Mordecai MRN: [id number] DOB: 1965/12/17   Cancelled Treatment:    Reason Eval/Treat Not Completed: Patient at procedure or test/ unavailable (OR); will follow up for OT treatment as schedule permits.  Marcy Siren, OT Supplemental Rehabilitation Services Pager [phone] Office [phone]   Orlando Penner 08/29/2018, 3:07 PM
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
## 2018-08-29 NOTE — Anesthesia Preprocedure Evaluation (Signed)
Anesthesia Evaluation  Patient identified by MRN, date of birth, ID band Patient unresponsive    Reviewed: Allergy & Precautions, Patient's Chart, lab work & pertinent test results  Airway Mallampati: Trach       Dental   Pulmonary Current Smoker,     + decreased breath sounds      Cardiovascular  Rhythm:Regular Rate:Tachycardia     Neuro/Psych    GI/Hepatic   Endo/Other    Renal/GU      Musculoskeletal   Abdominal   Peds  Hematology   Anesthesia Other Findings   Reproductive/Obstetrics                             Anesthesia Physical Anesthesia Plan  ASA: III  Anesthesia Plan: General   Post-op Pain Management:    Induction: Intravenous  PONV Risk Score and Plan: Ondansetron  Airway Management Planned: Tracheostomy  Additional Equipment:   Intra-op Plan:   Post-operative Plan:   Informed Consent: I have reviewed the patients History and Physical, chart, labs and discussed the procedure including the risks, benefits and alternatives for the proposed anesthesia with the patient or authorized representative who has indicated his/her understanding and acceptance.       Plan Discussed with: CRNA and Anesthesiologist  Anesthesia Plan Comments:         Anesthesia Quick Evaluation
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
## 2018-08-29 NOTE — Progress Notes (Signed)
SLP Cancellation Note  Patient Details Name: Norris Wey MRN: [id number] DOB: [birth year]   Cancelled treatment:         Pt in OR for debridement. Will continue to attempt to see for for inline speaking valve.  [name] 08/29/2018, 3:14 PM   [name].Ed Nurse, children's [phone] Office [phone]

## 2018-08-30 ENCOUNTER — Encounter (HOSPITAL_COMMUNITY): Payer: Self-pay | Admitting: Plastic Surgery

## 2018-08-30 LAB — BASIC METABOLIC PANEL
Anion gap: 7 (ref 5–15)
BUN: 15 mg/dL (ref 6–20)
CO2: 23 mmol/L (ref 22–32)
Calcium: 8.2 mg/dL — ABNORMAL LOW (ref 8.9–10.3)
Chloride: 111 mmol/L (ref 98–111)
Creatinine, Ser: <0.3 mg/dL — ABNORMAL LOW (ref 0.61–1.24)
Glucose, Bld: 106 mg/dL — ABNORMAL HIGH (ref 70–99)
Potassium: 3 mmol/L — ABNORMAL LOW (ref 3.5–5.1)
Sodium: 141 mmol/L (ref 135–145)

## 2018-08-30 LAB — GLUCOSE, CAPILLARY
Glucose-Capillary: 100 mg/dL — ABNORMAL HIGH (ref 70–99)
Glucose-Capillary: 107 mg/dL — ABNORMAL HIGH (ref 70–99)
Glucose-Capillary: 116 mg/dL — ABNORMAL HIGH (ref 70–99)
Glucose-Capillary: 130 mg/dL — ABNORMAL HIGH (ref 70–99)
Glucose-Capillary: 136 mg/dL — ABNORMAL HIGH (ref 70–99)
Glucose-Capillary: 99 mg/dL (ref 70–99)

## 2018-08-30 MED ORDER — PANTOPRAZOLE SODIUM 40 MG PO PACK
40.0000 mg | PACK | Freq: Every day | ORAL | Status: DC
  Administered 2018-08-30 – 2018-10-16 (×47): 40 mg
  Filled 2018-08-30 (×47): qty 20

## 2018-08-30 MED ORDER — POTASSIUM CHLORIDE 20 MEQ/15ML (10%) PO SOLN
40.0000 meq | Freq: Four times a day (QID) | ORAL | Status: AC
  Administered 2018-08-30 (×2): 40 meq via ORAL
  Filled 2018-08-30 (×2): qty 30

## 2018-08-30 MED ORDER — PIVOT 1.5 CAL PO LIQD
1000.0000 mL | ORAL | Status: DC
  Administered 2018-08-30 – 2018-09-02 (×5): 1000 mL
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
## 2018-08-30 NOTE — Progress Notes (Signed)
RT NOTE: RT worked with speech therapy for in-line passey muir valve through the ventilator. RT deflated cuff and turned PEEP to 0 per speech therapist. Patient tolerated for about 10 minutes. When done RT placed patient back on 5 of PEEP and returned air into trach cuff. No distress noted, VS within normal limits. RT will continue to monitor.

## 2018-08-31 LAB — BASIC METABOLIC PANEL
Anion gap: 3 — ABNORMAL LOW (ref 5–15)
BUN: 24 mg/dL — ABNORMAL HIGH (ref 6–20)
CO2: 26 mmol/L (ref 22–32)
Calcium: 8.4 mg/dL — ABNORMAL LOW (ref 8.9–10.3)
Chloride: 111 mmol/L (ref 98–111)
Creatinine, Ser: 0.3 mg/dL — ABNORMAL LOW (ref 0.61–1.24)
GFR calc Af Amer: >60 mL/min (ref >60)
GFR calc non Af Amer: >60 mL/min (ref >60)
Glucose, Bld: 135 mg/dL — ABNORMAL HIGH (ref 70–99)
Potassium: 3.9 mmol/L (ref 3.5–5.1)
Sodium: 140 mmol/L (ref 135–145)

## 2018-08-31 LAB — GLUCOSE, CAPILLARY
Glucose-Capillary: 108 mg/dL — ABNORMAL HIGH (ref 70–99)
Glucose-Capillary: 109 mg/dL — ABNORMAL HIGH (ref 70–99)
Glucose-Capillary: 118 mg/dL — ABNORMAL HIGH (ref 70–99)
Glucose-Capillary: 125 mg/dL — ABNORMAL HIGH (ref 70–99)
Glucose-Capillary: 129 mg/dL — ABNORMAL HIGH (ref 70–99)
Glucose-Capillary: 92 mg/dL (ref 70–99)

## 2018-08-31 LAB — CBC
HCT: 26.5 % — ABNORMAL LOW (ref 39.0–52.0)
Hemoglobin: 7.5 g/dL — ABNORMAL LOW (ref 13.0–17.0)
MCH: 29.4 pg (ref 26.0–34.0)
MCHC: 28.3 g/dL — ABNORMAL LOW (ref 30.0–36.0)
MCV: 103.9 fL — ABNORMAL HIGH (ref 80.0–100.0)
Platelets: 372 10*3/uL (ref 150–400)
RBC: 2.55 MIL/uL — ABNORMAL LOW (ref 4.22–5.81)
RDW: 15.9 % — ABNORMAL HIGH (ref 11.5–15.5)
WBC: 5.8 10*3/uL (ref 4.0–10.5)
nRBC: 0 % (ref 0.0–0.2)
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
## 2018-08-31 NOTE — Progress Notes (Signed)
RT spoke with Dr Janee Morn, according to [facility] pt trach has not been changed since 2/20. Dr Janee Morn said he would do it Monday 3/23. RT to follow up then
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
## 2018-09-01 LAB — GLUCOSE, CAPILLARY
Glucose-Capillary: 104 mg/dL — ABNORMAL HIGH (ref 70–99)
Glucose-Capillary: 108 mg/dL — ABNORMAL HIGH (ref 70–99)
Glucose-Capillary: 109 mg/dL — ABNORMAL HIGH (ref 70–99)
Glucose-Capillary: 119 mg/dL — ABNORMAL HIGH (ref 70–99)
Glucose-Capillary: 129 mg/dL — ABNORMAL HIGH (ref 70–99)
Glucose-Capillary: 92 mg/dL (ref 70–99)
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
## 2018-09-02 LAB — GLUCOSE, CAPILLARY
Glucose-Capillary: 104 mg/dL — ABNORMAL HIGH (ref 70–99)
Glucose-Capillary: 106 mg/dL — ABNORMAL HIGH (ref 70–99)
Glucose-Capillary: 156 mg/dL — ABNORMAL HIGH (ref 70–99)
Glucose-Capillary: 107 mg/dL — ABNORMAL HIGH (ref 70–99)
Glucose-Capillary: 128 mg/dL — ABNORMAL HIGH (ref 70–99)
Glucose-Capillary: 130 mg/dL — ABNORMAL HIGH (ref 70–99)

## 2018-09-02 LAB — BASIC METABOLIC PANEL
Anion gap: 5 (ref 5–15)
BUN: 21 mg/dL — ABNORMAL HIGH (ref 6–20)
CO2: 27 mmol/L (ref 22–32)
Calcium: 9 mg/dL (ref 8.9–10.3)
Chloride: 107 mmol/L (ref 98–111)
Creatinine, Ser: <0.3 mg/dL — ABNORMAL LOW (ref 0.61–1.24)
Glucose, Bld: 126 mg/dL — ABNORMAL HIGH (ref 70–99)
Potassium: 3.8 mmol/L (ref 3.5–5.1)
Sodium: 139 mmol/L (ref 135–145)

## 2018-09-02 MED ORDER — FUROSEMIDE 10 MG/ML IJ SOLN
40.0000 mg | Freq: Once | INTRAMUSCULAR | Status: AC
  Administered 2018-09-02: 40 mg via INTRAVENOUS
  Filled 2018-09-02: qty 4

## 2018-09-02 MED ORDER — ALBUTEROL SULFATE (2.5 MG/3ML) 0.083% IN NEBU: 2.5000 mg | INHALATION_SOLUTION | RESPIRATORY_TRACT | Status: DC | PRN

## 2018-09-02 MED ORDER — POTASSIUM CHLORIDE 20 MEQ/15ML (10%) PO SOLN
40.0000 meq | Freq: Once | ORAL | Status: AC
  Administered 2018-09-02: 40 meq
  Filled 2018-09-02: qty 30
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
## 2018-09-02 NOTE — Progress Notes (Signed)
Paged trauma MD on call Janee Morn about patient's episode of vomiting. New orders to hold tube feed for now. Tube feed on hold. MD said that it's okay if 2200 meds weren't absorbed and it's okay if we don't reschedule. Will continue to monitor.

## 2018-09-03 ENCOUNTER — Inpatient Hospital Stay (HOSPITAL_COMMUNITY): Payer: No Typology Code available for payment source

## 2018-09-03 LAB — CBC
HCT: 25.9 % — ABNORMAL LOW (ref 39.0–52.0)
Hemoglobin: 7.7 g/dL — ABNORMAL LOW (ref 13.0–17.0)
MCH: 30.2 pg (ref 26.0–34.0)
MCHC: 29.7 g/dL — ABNORMAL LOW (ref 30.0–36.0)
MCV: 101.6 fL — ABNORMAL HIGH (ref 80.0–100.0)
Platelets: 384 10*3/uL (ref 150–400)
RBC: 2.55 MIL/uL — ABNORMAL LOW (ref 4.22–5.81)
RDW: 16 % — ABNORMAL HIGH (ref 11.5–15.5)
WBC: 7.7 10*3/uL (ref 4.0–10.5)
nRBC: 0 % (ref 0.0–0.2)

## 2018-09-03 LAB — BASIC METABOLIC PANEL
Anion gap: 9 (ref 5–15)
BUN: 28 mg/dL — ABNORMAL HIGH (ref 6–20)
CO2: 26 mmol/L (ref 22–32)
Calcium: 9.5 mg/dL (ref 8.9–10.3)
Chloride: 106 mmol/L (ref 98–111)
Creatinine, Ser: 0.34 mg/dL — ABNORMAL LOW (ref 0.61–1.24)
GFR calc Af Amer: >60 mL/min (ref >60)
GFR calc non Af Amer: >60 mL/min (ref >60)
Glucose, Bld: 105 mg/dL — ABNORMAL HIGH (ref 70–99)
Potassium: 3.6 mmol/L (ref 3.5–5.1)
Sodium: 141 mmol/L (ref 135–145)

## 2018-09-03 LAB — GLUCOSE, CAPILLARY
Glucose-Capillary: 101 mg/dL — ABNORMAL HIGH (ref 70–99)
Glucose-Capillary: 90 mg/dL (ref 70–99)
Glucose-Capillary: 110 mg/dL — ABNORMAL HIGH (ref 70–99)
Glucose-Capillary: 81 mg/dL (ref 70–99)
Glucose-Capillary: 118 mg/dL — ABNORMAL HIGH (ref 70–99)
Glucose-Capillary: 127 mg/dL — ABNORMAL HIGH (ref 70–99)

## 2018-09-03 MED ORDER — PRO-STAT SUGAR FREE PO LIQD: 40.0000 mL | Freq: Two times a day (BID) | ORAL | Status: DC

## 2018-09-03 MED ORDER — PIVOT 1.5 CAL PO LIQD
1000.0000 mL | ORAL | Status: DC
  Administered 2018-09-03: 1000 mL

## 2018-09-03 MED ORDER — POTASSIUM CHLORIDE 20 MEQ/15ML (10%) PO SOLN
40.0000 meq | Freq: Once | ORAL | Status: AC
  Administered 2018-09-03: 40 meq
  Filled 2018-09-03: qty 30

## 2018-09-03 MED ORDER — PRO-STAT SUGAR FREE PO LIQD
60.0000 mL | Freq: Two times a day (BID) | ORAL | Status: DC
  Administered 2018-09-03 – 2018-10-16 (×84): 60 mL
  Filled 2018-09-03 (×83): qty 60

## 2018-09-03 MED ORDER — FUROSEMIDE 10 MG/ML IJ SOLN
40.0000 mg | Freq: Once | INTRAMUSCULAR | Status: AC
  Administered 2018-09-03: 40 mg via INTRAVENOUS
  Filled 2018-09-03: qty 4
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
## 2018-09-03 NOTE — Care Management (Signed)
Unable to complete SBIRT assessment due to mental status.  Will continue to follow.  Quintella Baton, RN, BSN  Trauma/Neuro ICU Case Manager [phone]
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
## 2018-09-04 LAB — GLUCOSE, CAPILLARY
Glucose-Capillary: 109 mg/dL — ABNORMAL HIGH (ref 70–99)
Glucose-Capillary: 83 mg/dL (ref 70–99)
Glucose-Capillary: 123 mg/dL — ABNORMAL HIGH (ref 70–99)
Glucose-Capillary: 132 mg/dL — ABNORMAL HIGH (ref 70–99)
Glucose-Capillary: 135 mg/dL — ABNORMAL HIGH (ref 70–99)
Glucose-Capillary: 123 mg/dL — ABNORMAL HIGH (ref 70–99)

## 2018-09-04 MED ORDER — METOCLOPRAMIDE HCL 5 MG/ML IJ SOLN
10.0000 mg | Freq: Four times a day (QID) | INTRAMUSCULAR | Status: DC
  Administered 2018-09-04 – 2018-09-13 (×37): 10 mg via INTRAVENOUS
  Filled 2018-09-04 (×38): qty 2

## 2018-09-04 MED ORDER — PIVOT 1.5 CAL PO LIQD
1000.0000 mL | ORAL | Status: DC
  Administered 2018-09-04 – 2018-09-12 (×9): 1000 mL
  Filled 2018-09-04: qty 1000
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
## 2018-09-04 NOTE — Care Management (Signed)
Clinical update faxed to [facility] Case Manager.  WC Case Manager currently working on getting authorization for [facility] from adjustor.  Once obtained, we can move forward with transfer to [facility], pending bed availability.  Will provide updates as they are available.   Quintella Baton, RN, BSN  Trauma/Neuro ICU Case Manager [phone]
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
## 2018-09-04 NOTE — Progress Notes (Signed)
  Speech Language Pathology  Patient Details Name: Christopher [name] MRN: [id number] DOB: 06-02-1966 Today's Date: 09/04/2018 Time:  Delphina Cahill past room and noted pt sitting edge of bed with OT and moderately alert. Sought RT for possible inline valve placement while pt up and they were beginning rounding and unable to assist at that time. Will continue efforts.                       [name] 09/04/2018, 3:19 PM   [name].Ed Nurse, children's [phone] Office [phone]

## 2018-09-04 NOTE — Consult Note (Signed)
WOC to see patient for weekly pouch change and stomal assessment. Pouch has been changed per staff on 08/31/18, intact with great seal. No teaching appropriate at this time.   Extra supplies ordered for staff [place] if needed.   WOC nurse team will follow along for support with ostomy PRN and education if appropriate.   Ahijah Devery [facility], CNS, [employer] [phone]
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
## 2018-09-05 LAB — URINALYSIS, ROUTINE W REFLEX MICROSCOPIC
Bilirubin Urine: NEGATIVE
Glucose, UA: NEGATIVE mg/dL
Ketones, ur: NEGATIVE mg/dL
Leukocytes,Ua: NEGATIVE
Nitrite: NEGATIVE
Protein, ur: NEGATIVE mg/dL
Specific Gravity, Urine: 1.014 (ref 1.005–1.030)
pH: 8 (ref 5.0–8.0)

## 2018-09-05 LAB — BASIC METABOLIC PANEL
Anion gap: 4 — ABNORMAL LOW (ref 5–15)
BUN: 28 mg/dL — ABNORMAL HIGH (ref 6–20)
CO2: 27 mmol/L (ref 22–32)
Calcium: 8.9 mg/dL (ref 8.9–10.3)
Chloride: 114 mmol/L — ABNORMAL HIGH (ref 98–111)
Creatinine, Ser: 0.39 mg/dL — ABNORMAL LOW (ref 0.61–1.24)
GFR calc Af Amer: >60 mL/min (ref >60)
GFR calc non Af Amer: >60 mL/min (ref >60)
Glucose, Bld: 117 mg/dL — ABNORMAL HIGH (ref 70–99)
Potassium: 3.3 mmol/L — ABNORMAL LOW (ref 3.5–5.1)
Sodium: 145 mmol/L (ref 135–145)

## 2018-09-05 LAB — GLUCOSE, CAPILLARY
Glucose-Capillary: 115 mg/dL — ABNORMAL HIGH (ref 70–99)
Glucose-Capillary: 144 mg/dL — ABNORMAL HIGH (ref 70–99)
Glucose-Capillary: 121 mg/dL — ABNORMAL HIGH (ref 70–99)
Glucose-Capillary: 140 mg/dL — ABNORMAL HIGH (ref 70–99)
Glucose-Capillary: 152 mg/dL — ABNORMAL HIGH (ref 70–99)
Glucose-Capillary: 139 mg/dL — ABNORMAL HIGH (ref 70–99)

## 2018-09-05 LAB — CBC
HCT: 25.2 % — ABNORMAL LOW (ref 39.0–52.0)
Hemoglobin: 7.4 g/dL — ABNORMAL LOW (ref 13.0–17.0)
MCH: 30.7 pg (ref 26.0–34.0)
MCHC: 29.4 g/dL — ABNORMAL LOW (ref 30.0–36.0)
MCV: 104.6 fL — ABNORMAL HIGH (ref 80.0–100.0)
Platelets: 426 10*3/uL — ABNORMAL HIGH (ref 150–400)
RBC: 2.41 MIL/uL — ABNORMAL LOW (ref 4.22–5.81)
RDW: 16.2 % — ABNORMAL HIGH (ref 11.5–15.5)
WBC: 5.7 10*3/uL (ref 4.0–10.5)
nRBC: 0 % (ref 0.0–0.2)

## 2018-09-05 MED ORDER — POTASSIUM CHLORIDE 20 MEQ/15ML (10%) PO SOLN
40.0000 meq | Freq: Two times a day (BID) | ORAL | Status: AC
  Administered 2018-09-05 (×2): 40 meq
  Filled 2018-09-05 (×2): qty 30

## 2018-09-05 MED ORDER — FUROSEMIDE 10 MG/ML IJ SOLN
40.0000 mg | Freq: Once | INTRAMUSCULAR | Status: AC
  Administered 2018-09-05: 40 mg via INTRAVENOUS
  Filled 2018-09-05: qty 4

## 2018-09-05 NOTE — Progress Notes (Signed)
UOP low post Lasix administration, bladder scan showed 540.  Urine at that time was amber/cloudy/sedimentous. Order to irrigate given per Dr. Janee Morn, 1.5 L out, urine progressively got more clear with output.  UA obtained & sent.
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
## 2018-09-06 LAB — GLUCOSE, CAPILLARY
Glucose-Capillary: 123 mg/dL — ABNORMAL HIGH (ref 70–99)
Glucose-Capillary: 107 mg/dL — ABNORMAL HIGH (ref 70–99)
Glucose-Capillary: 140 mg/dL — ABNORMAL HIGH (ref 70–99)
Glucose-Capillary: 110 mg/dL — ABNORMAL HIGH (ref 70–99)
Glucose-Capillary: 116 mg/dL — ABNORMAL HIGH (ref 70–99)
Glucose-Capillary: 98 mg/dL (ref 70–99)
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
## 2018-09-06 NOTE — Consult Note (Signed)
WOC Nurse ostomy follow up Stoma type/location: LLQ, end colostomy Stomal assessment/size: 1 1/2" round, budded, pink Peristomal assessment: intact  Treatment options for stomal/peristomal skin: NA Output green, liquid Ostomy pouching: 2pc. 2 3/4" pouch changed Education provided: None, no family in the room. Patient on the vent, teaching not appropriate at this time.  Enrolled patient in [place] Secure Start Discharge program: No   WOC Nurse will follow along with you for continued support with ostomy teaching and care Ricard Faulkner [facility] MSN, RN, [place], CNS, [place] [phone]
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
## 2018-09-06 NOTE — Care Management (Signed)
Notified by admissions liaison with [facility], that facility has chosen to decline patient admission.  The facility feels that the extensive amount of time and manpower patient is requiring with twice daily dressing changes will be overwhelming to staff and wound care nurse.  Should pt wound care requirements lessen after further healing, may consider re-consulting LTAC, but will hold for now.  Nurse Care Manager will continue to follow progress.    Will notify WC Case Manager and pt's fiance, [name] of this update.  Attending MD notified.   Quintella Baton, RN, BSN  Trauma/Neuro ICU Case Manager [phone]

## 2018-09-07 ENCOUNTER — Telehealth: Payer: Self-pay | Admitting: Plastic Surgery

## 2018-09-07 LAB — BASIC METABOLIC PANEL
Anion gap: 8 (ref 5–15)
BUN: 31 mg/dL — ABNORMAL HIGH (ref 6–20)
CO2: 28 mmol/L (ref 22–32)
Calcium: 9.5 mg/dL (ref 8.9–10.3)
Chloride: 111 mmol/L (ref 98–111)
Creatinine, Ser: 0.33 mg/dL — ABNORMAL LOW (ref 0.61–1.24)
GFR calc Af Amer: >60 mL/min (ref >60)
GFR calc non Af Amer: >60 mL/min (ref >60)
Glucose, Bld: 129 mg/dL — ABNORMAL HIGH (ref 70–99)
Potassium: 3.1 mmol/L — ABNORMAL LOW (ref 3.5–5.1)
Sodium: 147 mmol/L — ABNORMAL HIGH (ref 135–145)

## 2018-09-07 LAB — GLUCOSE, CAPILLARY
Glucose-Capillary: 102 mg/dL — ABNORMAL HIGH (ref 70–99)
Glucose-Capillary: 105 mg/dL — ABNORMAL HIGH (ref 70–99)
Glucose-Capillary: 112 mg/dL — ABNORMAL HIGH (ref 70–99)
Glucose-Capillary: 121 mg/dL — ABNORMAL HIGH (ref 70–99)
Glucose-Capillary: 135 mg/dL — ABNORMAL HIGH (ref 70–99)
Glucose-Capillary: 175 mg/dL — ABNORMAL HIGH (ref 70–99)

## 2018-09-07 MED ORDER — POTASSIUM CHLORIDE 20 MEQ/15ML (10%) PO SOLN
40.0000 meq | Freq: Once | ORAL | Status: AC
  Administered 2018-09-07: 40 meq
  Filled 2018-09-07: qty 30

## 2018-09-07 MED ORDER — FUROSEMIDE 10 MG/ML IJ SOLN
40.0000 mg | Freq: Once | INTRAMUSCULAR | Status: AC
  Administered 2018-09-07: 40 mg via INTRAVENOUS
  Filled 2018-09-07: qty 4

## 2018-09-07 MED ORDER — POTASSIUM CHLORIDE 10 MEQ/50ML IV SOLN
10.0000 meq | INTRAVENOUS | Status: AC
  Administered 2018-09-07 (×4): 10 meq via INTRAVENOUS
  Filled 2018-09-07: qty 50

## 2018-09-07 MED ORDER — SODIUM CHLORIDE 0.9% FLUSH
10.0000 mL | Freq: Three times a day (TID) | INTRAVENOUS | Status: DC
  Administered 2018-09-07 – 2018-09-16 (×25): 10 mL
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
## 2018-09-07 NOTE — Care Management (Signed)
Spoke with pt's fiance, Melanie regarding LTAC hospital declining this admission.  She is pleased with this news, as she is so happy with the care he is getting on [facility].  Fiance has questions for plastic surgeon regarding plan for further surgery and possible grafting sites.  Left message for Dr. Ulice Bold at her office to call fiance to answer questions.    Quintella Baton, RN, BSN  Trauma/Neuro ICU Case Manager [phone]
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
## 2018-09-07 NOTE — Telephone Encounter (Signed)
Received call from Zenaida Deed, Case Manager for Trauma Service. Patient's significant other, [facility], is requesting a call from Dr. Ulice Bold to answer some questions that she has about the patient. Miss Lafonda Mosses number is [phone].
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
## 2018-09-07 NOTE — Progress Notes (Addendum)
Subjective: In ICU and improving daily and slowly.  Objective: Vital signs in last 24 hours: Temp:  [98.5 F (36.9 C)-100.3 F (37.9 C)] 98.8 F (37.1 C) (03/27 1200) Pulse Rate:  [96-127] 96 (03/27 1300) Resp:  [18-26] 18 (03/27 1300) BP: (97-148)/(59-92) 97/62 (03/27 1300) SpO2:  [83 %-100 %] 96 % (03/27 1300) FiO2 (%):  [40 %] 40 % (03/27 1200) Weight:  [73.4 kg] 73.4 kg (03/27 0500) Weight change: -0.6 kg Last BM Date: 09/07/18  Intake/Output from previous day: 03/26 0701 - 03/27 0700 In: 1450.7 [I.V.:90.7; NG/GT:1360] Out: 2400 [Urine:2075; Drains:75; Stool:250] Intake/Output this shift: Total I/O In: 1779 [I.V.:31.4; Other:180; NG/GT:1390; IV Piggyback:177.5] Out: 1425 [Urine:1425]  General appearance: alert and no distress Incision/Wound: Acell incorporating and wounds clean  Lab Results: Recent Labs    09/05/18 0430  WBC 5.7  HGB 7.4*  HCT 25.2*  PLT 426*   BMET Recent Labs    09/05/18 0430 09/07/18 0528  NA 145 147*  K 3.3* 3.1*  CL 114* 111  CO2 27 28  GLUCOSE 117* 129*  BUN 28* 31*  CREATININE 0.39* 0.33*  CALCIUM 8.9 9.5    Studies/Results: No results found.  Medications: I have reviewed the patient's current medications.  Assessment/Plan: Plan for OR in next 7-10 days for staged grafting.  Will likely start with gluteal skin graft and acell to left leg and then 1/5 weeks later graft of left leg. ICU doing amazing job on wounds.  LOS: 59 days   Alena Bills Brinley Rosete 09/07/2018, 1:19 PM

## 2018-09-08 LAB — HEMOGLOBIN AND HEMATOCRIT, BLOOD
HCT: 26.4 % — ABNORMAL LOW (ref 39.0–52.0)
Hemoglobin: 7.7 g/dL — ABNORMAL LOW (ref 13.0–17.0)

## 2018-09-08 LAB — GLUCOSE, CAPILLARY
Glucose-Capillary: 117 mg/dL — ABNORMAL HIGH (ref 70–99)
Glucose-Capillary: 119 mg/dL — ABNORMAL HIGH (ref 70–99)
Glucose-Capillary: 120 mg/dL — ABNORMAL HIGH (ref 70–99)
Glucose-Capillary: 126 mg/dL — ABNORMAL HIGH (ref 70–99)
Glucose-Capillary: 130 mg/dL — ABNORMAL HIGH (ref 70–99)
Glucose-Capillary: 134 mg/dL — ABNORMAL HIGH (ref 70–99)

## 2018-09-08 LAB — BASIC METABOLIC PANEL
Anion gap: 6 (ref 5–15)
BUN: 30 mg/dL — ABNORMAL HIGH (ref 6–20)
CO2: 27 mmol/L (ref 22–32)
Calcium: 9.4 mg/dL (ref 8.9–10.3)
Chloride: 113 mmol/L — ABNORMAL HIGH (ref 98–111)
Creatinine, Ser: 0.31 mg/dL — ABNORMAL LOW (ref 0.61–1.24)
GFR calc Af Amer: >60 mL/min (ref >60)
GFR calc non Af Amer: >60 mL/min (ref >60)
Glucose, Bld: 130 mg/dL — ABNORMAL HIGH (ref 70–99)
Potassium: 3.6 mmol/L (ref 3.5–5.1)
Sodium: 146 mmol/L — ABNORMAL HIGH (ref 135–145)

## 2018-09-08 LAB — CBC
HCT: 24.7 % — ABNORMAL LOW (ref 39.0–52.0)
HCT: 24.7 % — ABNORMAL LOW (ref 39.0–52.0)
Hemoglobin: 7.2 g/dL — ABNORMAL LOW (ref 13.0–17.0)
Hemoglobin: 6.9 g/dL — CL (ref 13.0–17.0)
MCH: 31.2 pg (ref 26.0–34.0)
MCH: 29.9 pg (ref 26.0–34.0)
MCHC: 29.1 g/dL — ABNORMAL LOW (ref 30.0–36.0)
MCHC: 27.9 g/dL — ABNORMAL LOW (ref 30.0–36.0)
MCV: 106.9 fL — ABNORMAL HIGH (ref 80.0–100.0)
MCV: 106.9 fL — ABNORMAL HIGH (ref 80.0–100.0)
Platelets: 420 10*3/uL — ABNORMAL HIGH (ref 150–400)
Platelets: 455 10*3/uL — ABNORMAL HIGH (ref 150–400)
RBC: 2.31 MIL/uL — ABNORMAL LOW (ref 4.22–5.81)
RBC: 2.31 MIL/uL — ABNORMAL LOW (ref 4.22–5.81)
RDW: 16.4 % — ABNORMAL HIGH (ref 11.5–15.5)
RDW: 16.3 % — ABNORMAL HIGH (ref 11.5–15.5)
WBC: 6.7 10*3/uL (ref 4.0–10.5)
WBC: 7 10*3/uL (ref 4.0–10.5)
nRBC: 0 % (ref 0.0–0.2)
nRBC: 0 % (ref 0.0–0.2)

## 2018-09-08 LAB — PREPARE RBC (CROSSMATCH)

## 2018-09-08 MED ORDER — SODIUM CHLORIDE 0.9% IV SOLUTION: Freq: Once | INTRAVENOUS | Status: DC
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
## 2018-09-08 NOTE — Progress Notes (Signed)
CRITICAL VALUE ALERT  Critical Value:  Hemoglobin 6.9  Date & Time Notied:  09/08/2018 @ 2:24  Provider Notified: Trauma  Orders Received/Actions taken: 1 unit RBC

## 2018-09-09 LAB — GLUCOSE, CAPILLARY
Glucose-Capillary: 105 mg/dL — ABNORMAL HIGH (ref 70–99)
Glucose-Capillary: 124 mg/dL — ABNORMAL HIGH (ref 70–99)
Glucose-Capillary: 115 mg/dL — ABNORMAL HIGH (ref 70–99)
Glucose-Capillary: 111 mg/dL — ABNORMAL HIGH (ref 70–99)
Glucose-Capillary: 96 mg/dL (ref 70–99)
Glucose-Capillary: 141 mg/dL — ABNORMAL HIGH (ref 70–99)

## 2018-09-09 LAB — TYPE AND SCREEN
ABO/RH(D): O POS
Antibody Screen: NEGATIVE
Unit division: 0

## 2018-09-09 LAB — BPAM RBC
Blood Product Expiration Date: 202004172359
ISSUE DATE / TIME: 202003281646
Unit Type and Rh: 5100
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
## 2018-09-10 LAB — GLUCOSE, CAPILLARY
Glucose-Capillary: 87 mg/dL (ref 70–99)
Glucose-Capillary: 101 mg/dL — ABNORMAL HIGH (ref 70–99)
Glucose-Capillary: 128 mg/dL — ABNORMAL HIGH (ref 70–99)
Glucose-Capillary: 146 mg/dL — ABNORMAL HIGH (ref 70–99)
Glucose-Capillary: 149 mg/dL — ABNORMAL HIGH (ref 70–99)
Glucose-Capillary: 134 mg/dL — ABNORMAL HIGH (ref 70–99)

## 2018-09-10 MED ORDER — CLONIDINE ORAL SUSPENSION 10 MCG/ML: 0.2000 mg | Freq: Two times a day (BID) | ORAL | Status: DC

## 2018-09-10 MED ORDER — CLONIDINE HCL 0.2 MG PO TABS
0.2000 mg | ORAL_TABLET | Freq: Two times a day (BID) | ORAL | Status: DC
  Administered 2018-09-10 – 2018-10-16 (×70): 0.2 mg
  Filled 2018-09-10 (×71): qty 1

## 2018-09-10 NOTE — Consult Note (Signed)
WOC Nurse ostomy follow up Patient receiving care in [facility] 4N18. No visitors present. Insufficient ostomy supplies at bedside to perform pouch change.  Additional supplies requested. Helmut Muster, RN, MSN, CWOCN, CNS-BC, pager [phone]

## 2018-09-10 NOTE — Progress Notes (Signed)
PT Cancellation Note  Patient Details Name: Jaydian Iwanicki MRN: [id number] DOB: 06/21/1965   Cancelled Treatment:    Reason Eval/Treat Not Completed: Medical issues which prohibited therapy.  Please hold per RN. 09/10/2018  Woodruff Bing, PT Acute Rehabilitation Services [phone]  (pager) [phone]  (office)   Eliseo Gum Hazeline Charnley 09/10/2018, 2:53 PM
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
## 2018-09-11 LAB — CBC
HCT: 26.4 % — ABNORMAL LOW (ref 39.0–52.0)
Hemoglobin: 7.6 g/dL — ABNORMAL LOW (ref 13.0–17.0)
MCH: 30 pg (ref 26.0–34.0)
MCHC: 28.8 g/dL — ABNORMAL LOW (ref 30.0–36.0)
MCV: 104.3 fL — ABNORMAL HIGH (ref 80.0–100.0)
Platelets: 431 10*3/uL — ABNORMAL HIGH (ref 150–400)
RBC: 2.53 MIL/uL — ABNORMAL LOW (ref 4.22–5.81)
RDW: 17.7 % — ABNORMAL HIGH (ref 11.5–15.5)
WBC: 6.4 10*3/uL (ref 4.0–10.5)
nRBC: 0 % (ref 0.0–0.2)

## 2018-09-11 LAB — BASIC METABOLIC PANEL
Anion gap: 7 (ref 5–15)
BUN: 50 mg/dL — ABNORMAL HIGH (ref 6–20)
CO2: 28 mmol/L (ref 22–32)
Calcium: 10 mg/dL (ref 8.9–10.3)
Chloride: 114 mmol/L — ABNORMAL HIGH (ref 98–111)
Creatinine, Ser: 0.41 mg/dL — ABNORMAL LOW (ref 0.61–1.24)
GFR calc Af Amer: >60 mL/min (ref >60)
GFR calc non Af Amer: >60 mL/min (ref >60)
Glucose, Bld: 132 mg/dL — ABNORMAL HIGH (ref 70–99)
Potassium: 3.3 mmol/L — ABNORMAL LOW (ref 3.5–5.1)
Sodium: 149 mmol/L — ABNORMAL HIGH (ref 135–145)

## 2018-09-11 LAB — GLUCOSE, CAPILLARY
Glucose-Capillary: 132 mg/dL — ABNORMAL HIGH (ref 70–99)
Glucose-Capillary: 98 mg/dL (ref 70–99)
Glucose-Capillary: 143 mg/dL — ABNORMAL HIGH (ref 70–99)
Glucose-Capillary: 129 mg/dL — ABNORMAL HIGH (ref 70–99)
Glucose-Capillary: 119 mg/dL — ABNORMAL HIGH (ref 70–99)
Glucose-Capillary: 120 mg/dL — ABNORMAL HIGH (ref 70–99)

## 2018-09-11 MED ORDER — POTASSIUM CHLORIDE 20 MEQ/15ML (10%) PO SOLN
40.0000 meq | Freq: Two times a day (BID) | ORAL | Status: AC
  Administered 2018-09-11 (×2): 40 meq via ORAL
  Filled 2018-09-11 (×2): qty 30

## 2018-09-11 NOTE — Consult Note (Signed)
WOC Nurse ostomy follow up Patient continues to receive care in [facility] 4N18.  No visitors present. Patient is unable to be taught self care pertaining to ostomy care.  LUQ colostomy stoma is stable, pink, moist, producing thin brown stool and gas. Supplies are at the bedside.  Pouch change date was 3/30, therefore, pouch not removed today. All is intact without leakage. Helmut Muster, RN, MSN, CWOCN, CNS-BC, pager [phone]

## 2018-09-11 NOTE — Care Management (Signed)
Clinical updates faxed to Laretta Bolster, pt's worker's comp case Production designer, theatre/television/film.    Quintella Baton, RN, BSN  Trauma/Neuro ICU Case Manager [phone]

## 2018-09-11 NOTE — Progress Notes (Signed)
OT Cancellation Note  Patient Details Name: Christopher [name] MRN: [id number] DOB: 31-Oct-1965   Cancelled Treatment:    Reason Eval/Treat Not Completed: Patient at procedure or test/ unavailable(RN changing dressing. Will return as schedule allows. Thank you.)  Valmai Vandenberghe M Mico Spark Jacqulene Huntley MSOT, OTR/L Acute Rehab Pager: [phone] Office: [phone] 09/11/2018, 2:58 PM
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
## 2018-09-11 NOTE — Progress Notes (Signed)
PT Cancellation Note  Patient Details Name: Christopher [name] MRN: [id number] DOB: 01-04-1966   Cancelled Treatment:    Reason Eval/Treat Not Completed: (P) Patient at procedure or test/unavailable Pt getting dressing change and bath and could not tolerate additional movement today. PT to follow back tomorrow.  Jazz Biddy B. Beverely Risen PT, DPT Acute Rehabilitation Services Pager [phone] Office [phone]    Elon Alas Fleet 09/11/2018, 2:59 PM

## 2018-09-12 ENCOUNTER — Inpatient Hospital Stay (HOSPITAL_COMMUNITY): Payer: No Typology Code available for payment source

## 2018-09-12 DIAGNOSIS — S32811B Multiple fractures of pelvis with unstable disruption of pelvic ring, initial encounter for open fracture: Principal | ICD-10-CM

## 2018-09-12 DIAGNOSIS — J9600 Acute respiratory failure, unspecified whether with hypoxia or hypercapnia: Secondary | ICD-10-CM

## 2018-09-12 LAB — GLUCOSE, CAPILLARY
Glucose-Capillary: 143 mg/dL — ABNORMAL HIGH (ref 70–99)
Glucose-Capillary: 125 mg/dL — ABNORMAL HIGH (ref 70–99)
Glucose-Capillary: 153 mg/dL — ABNORMAL HIGH (ref 70–99)
Glucose-Capillary: 146 mg/dL — ABNORMAL HIGH (ref 70–99)
Glucose-Capillary: 139 mg/dL — ABNORMAL HIGH (ref 70–99)
Glucose-Capillary: 167 mg/dL — ABNORMAL HIGH (ref 70–99)

## 2018-09-12 LAB — BASIC METABOLIC PANEL
Anion gap: 7 (ref 5–15)
BUN: 41 mg/dL — ABNORMAL HIGH (ref 6–20)
CO2: 27 mmol/L (ref 22–32)
Calcium: 10.2 mg/dL (ref 8.9–10.3)
Chloride: 117 mmol/L — ABNORMAL HIGH (ref 98–111)
Creatinine, Ser: 0.5 mg/dL — ABNORMAL LOW (ref 0.61–1.24)
GFR calc Af Amer: >60 mL/min (ref >60)
GFR calc non Af Amer: >60 mL/min (ref >60)
Glucose, Bld: 167 mg/dL — ABNORMAL HIGH (ref 70–99)
Potassium: 3.7 mmol/L (ref 3.5–5.1)
Sodium: 151 mmol/L — ABNORMAL HIGH (ref 135–145)

## 2018-09-12 MED ORDER — PIVOT 1.5 CAL PO LIQD
1000.0000 mL | ORAL | Status: DC
  Administered 2018-09-12 – 2018-09-17 (×8): 1000 mL
  Filled 2018-09-12 (×11): qty 1000

## 2018-09-12 MED ORDER — FREE WATER
300.0000 mL | Status: DC
  Administered 2018-09-12 – 2018-10-16 (×197): 300 mL

## 2018-09-12 MED ORDER — FENTANYL 100 MCG/HR TD PT72
1.0000 | MEDICATED_PATCH | TRANSDERMAL | Status: DC
  Administered 2018-09-12 – 2018-10-09 (×9): 1 via TRANSDERMAL
  Filled 2018-09-12 (×9): qty 1
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
## 2018-09-12 NOTE — Progress Notes (Signed)
Notified by nursing that suprapubic catheter was not draining.  It was initially placed on 07/11/2018.  It has not been exchanged.  Initially tried to flush the catheter.  This met some resistance indicating likely blockage from stone.  I then tried to place a wire through the catheter but this would not pass and met resistance.  Therefore remove the catheter.  There was some resistance doing this secondary to a great deal of buildup of stone/debris on the end of the catheter.  I was however able to remove the initial suprapubic catheter.  I advanced a wire through the suprapubic tube tract.  I then advanced a [employer] [place] [employer] tip catheter over a wire into the bladder and remove the wire.  I instilled 10 cc of sterile water into the catheter balloon.  Clear yellow urine drained.  I connected it to a drainage bag and nursing secured it down.  Catheter will need to be changed in 1 month.  The date for that will be 10/12/2018.
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
## 2018-09-12 NOTE — Progress Notes (Signed)
RT note: patient placed back on full support ventilation due to increased respiratory rate.  Currently tolerating well.  Will continue to monitor.  

## 2018-09-12 NOTE — Progress Notes (Signed)
RT note: attempted patient on 40% trach collar this AM however patient's respiratory rate began to go into the 50s.  Placed patient back on ventilation on CPAP/PSV of 15/5 and is currently tolerating well.  Will continue to monitor.
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
## 2018-09-12 NOTE — Progress Notes (Signed)
   09/12/18 0100  Provider Notification  Provider Name/Title Modena Slater, MD  Date Provider Notified 09/12/18  Time Provider Notified [phone]  Notification Type Call  Notification Reason Other (Comment) (Suprapubic catheter clogged)  Response Other (Comment) (MD to come to bedside)  Date of Provider Response 09/12/18  Time of Provider Response 0112   This RN attempted to flush patient's suprapubic catheter at 0041, catheter would not flush towards patient nor would it pull back. Performed bladder scan and observed of urine in patient's bladder. After several unsuccessful attempts to flush catheter on-call Urologist paged. MD informed this RN that he would come to patient's bedside.  Aris Lot, RN
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
## 2018-09-12 NOTE — Progress Notes (Signed)
RT NOTE:  Attempted to place in-line PMV with speech therapy. Deflated cuff and temporarily placed the peep at 0 for trial. Pt was sitting up on the side of the bed with physical therapy and became very agitaed with the combination of movement and the in-line PMV trial. Pt's RR increased into the 50s. Pt was placed back on previous settings, inflated cuff, and placed back on lying position. Pt's vitals are stable at this time. RT will continue to monitor.
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
## 2018-09-13 ENCOUNTER — Other Ambulatory Visit: Payer: Self-pay

## 2018-09-13 LAB — GLUCOSE, CAPILLARY
Glucose-Capillary: 127 mg/dL — ABNORMAL HIGH (ref 70–99)
Glucose-Capillary: 133 mg/dL — ABNORMAL HIGH (ref 70–99)
Glucose-Capillary: 157 mg/dL — ABNORMAL HIGH (ref 70–99)
Glucose-Capillary: 128 mg/dL — ABNORMAL HIGH (ref 70–99)
Glucose-Capillary: 143 mg/dL — ABNORMAL HIGH (ref 70–99)
Glucose-Capillary: 140 mg/dL — ABNORMAL HIGH (ref 70–99)

## 2018-09-13 LAB — BASIC METABOLIC PANEL
Anion gap: 7 (ref 5–15)
BUN: 38 mg/dL — ABNORMAL HIGH (ref 6–20)
CO2: 26 mmol/L (ref 22–32)
Calcium: 9.7 mg/dL (ref 8.9–10.3)
Chloride: 117 mmol/L — ABNORMAL HIGH (ref 98–111)
Creatinine, Ser: 0.31 mg/dL — ABNORMAL LOW (ref 0.61–1.24)
GFR calc Af Amer: >60 mL/min (ref >60)
GFR calc non Af Amer: >60 mL/min (ref >60)
Glucose, Bld: 159 mg/dL — ABNORMAL HIGH (ref 70–99)
Potassium: 3.2 mmol/L — ABNORMAL LOW (ref 3.5–5.1)
Sodium: 150 mmol/L — ABNORMAL HIGH (ref 135–145)

## 2018-09-13 LAB — CBC
HCT: 24 % — ABNORMAL LOW (ref 39.0–52.0)
HCT: 26.5 % — ABNORMAL LOW (ref 39.0–52.0)
Hemoglobin: 6.8 g/dL — CL (ref 13.0–17.0)
Hemoglobin: 7.6 g/dL — ABNORMAL LOW (ref 13.0–17.0)
MCH: 30.5 pg (ref 26.0–34.0)
MCH: 29.9 pg (ref 26.0–34.0)
MCHC: 28.3 g/dL — ABNORMAL LOW (ref 30.0–36.0)
MCHC: 28.7 g/dL — ABNORMAL LOW (ref 30.0–36.0)
MCV: 107.6 fL — ABNORMAL HIGH (ref 80.0–100.0)
MCV: 104.3 fL — ABNORMAL HIGH (ref 80.0–100.0)
Platelets: 379 10*3/uL (ref 150–400)
Platelets: 401 10*3/uL — ABNORMAL HIGH (ref 150–400)
RBC: 2.23 MIL/uL — ABNORMAL LOW (ref 4.22–5.81)
RBC: 2.54 MIL/uL — ABNORMAL LOW (ref 4.22–5.81)
RDW: 17.4 % — ABNORMAL HIGH (ref 11.5–15.5)
RDW: 19.1 % — ABNORMAL HIGH (ref 11.5–15.5)
WBC: 6.8 10*3/uL (ref 4.0–10.5)
WBC: 6.6 10*3/uL (ref 4.0–10.5)
nRBC: 0 % (ref 0.0–0.2)
nRBC: 0 % (ref 0.0–0.2)

## 2018-09-13 LAB — PREPARE RBC (CROSSMATCH)

## 2018-09-13 MED ORDER — POTASSIUM CHLORIDE 20 MEQ/15ML (10%) PO SOLN
40.0000 meq | Freq: Two times a day (BID) | ORAL | Status: AC
  Administered 2018-09-13 (×2): 40 meq via ORAL
  Filled 2018-09-13 (×2): qty 30

## 2018-09-13 MED ORDER — METOCLOPRAMIDE HCL 5 MG/5ML PO SOLN
10.0000 mg | Freq: Four times a day (QID) | ORAL | Status: DC
  Administered 2018-09-13 – 2018-10-16 (×130): 10 mg
  Filled 2018-09-13 (×136): qty 10

## 2018-09-13 MED ORDER — SODIUM CHLORIDE 0.9% IV SOLUTION
Freq: Once | INTRAVENOUS | Status: AC
  Administered 2018-09-13: 10:00:00 via INTRAVENOUS

## 2018-09-13 NOTE — Plan of Care (Signed)
  Problem: Nutrition: Goal: Adequate nutrition will be maintained Outcome: Progressing   
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
## 2018-09-13 NOTE — Progress Notes (Signed)
RT note: attempted SBT on patient this AM on CPAP/PSV of 12/5.  Patient respiratory rate increased to the 50s.  Patient placed back on full support ventilation and currently tolerating well.  Will continue to monitor.

## 2018-09-14 ENCOUNTER — Inpatient Hospital Stay (HOSPITAL_COMMUNITY): Payer: No Typology Code available for payment source

## 2018-09-14 LAB — BASIC METABOLIC PANEL
Anion gap: 6 (ref 5–15)
BUN: 39 mg/dL — ABNORMAL HIGH (ref 6–20)
CO2: 29 mmol/L (ref 22–32)
Calcium: 10.2 mg/dL (ref 8.9–10.3)
Chloride: 117 mmol/L — ABNORMAL HIGH (ref 98–111)
Creatinine, Ser: 0.34 mg/dL — ABNORMAL LOW (ref 0.61–1.24)
GFR calc Af Amer: >60 mL/min (ref >60)
GFR calc non Af Amer: >60 mL/min (ref >60)
Glucose, Bld: 143 mg/dL — ABNORMAL HIGH (ref 70–99)
Potassium: 4 mmol/L (ref 3.5–5.1)
Sodium: 152 mmol/L — ABNORMAL HIGH (ref 135–145)

## 2018-09-14 LAB — BPAM RBC
Blood Product Expiration Date: 202004182359
ISSUE DATE / TIME: 202004020924
Unit Type and Rh: 5100

## 2018-09-14 LAB — CBC
HCT: 30.2 % — ABNORMAL LOW (ref 39.0–52.0)
Hemoglobin: 8.4 g/dL — ABNORMAL LOW (ref 13.0–17.0)
MCH: 29.2 pg (ref 26.0–34.0)
MCHC: 27.8 g/dL — ABNORMAL LOW (ref 30.0–36.0)
MCV: 104.9 fL — ABNORMAL HIGH (ref 80.0–100.0)
Platelets: 432 10*3/uL — ABNORMAL HIGH (ref 150–400)
RBC: 2.88 MIL/uL — ABNORMAL LOW (ref 4.22–5.81)
RDW: 18.8 % — ABNORMAL HIGH (ref 11.5–15.5)
WBC: 7.9 10*3/uL (ref 4.0–10.5)
nRBC: 0 % (ref 0.0–0.2)

## 2018-09-14 LAB — GLUCOSE, CAPILLARY
Glucose-Capillary: 169 mg/dL — ABNORMAL HIGH (ref 70–99)
Glucose-Capillary: 145 mg/dL — ABNORMAL HIGH (ref 70–99)
Glucose-Capillary: 152 mg/dL — ABNORMAL HIGH (ref 70–99)
Glucose-Capillary: 127 mg/dL — ABNORMAL HIGH (ref 70–99)
Glucose-Capillary: 126 mg/dL — ABNORMAL HIGH (ref 70–99)
Glucose-Capillary: 132 mg/dL — ABNORMAL HIGH (ref 70–99)

## 2018-09-14 LAB — TYPE AND SCREEN
ABO/RH(D): O POS
Antibody Screen: NEGATIVE
Unit division: 0

## 2018-09-14 MED ORDER — FUROSEMIDE 10 MG/ML IJ SOLN
40.0000 mg | Freq: Once | INTRAMUSCULAR | Status: AC
  Administered 2018-09-14: 40 mg via INTRAVENOUS
  Filled 2018-09-14: qty 4

## 2018-09-14 MED ORDER — POTASSIUM CHLORIDE 20 MEQ/15ML (10%) PO SOLN
40.0000 meq | Freq: Once | ORAL | Status: AC
  Administered 2018-09-14: 40 meq
  Filled 2018-09-14: qty 30

## 2018-09-14 MED ORDER — FENTANYL CITRATE (PF) 100 MCG/2ML IJ SOLN
50.0000 ug | INTRAMUSCULAR | Status: DC | PRN
  Administered 2018-09-15 – 2018-10-14 (×32): 50 ug via INTRAVENOUS
  Filled 2018-09-14 (×32): qty 2

## 2018-09-14 NOTE — Progress Notes (Signed)
Ortho Trauma Progress Note  Reviewed pelvic x-ray and everything remains stable with bridging callus along right superior pubic ramus. Will recommend advancement to WBAT BLE. Recommend follow up x-rays in 4 weeks.  Roby Lofts, MD [facility] [phone] (phone)
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
## 2018-09-15 LAB — BASIC METABOLIC PANEL
Anion gap: 6 (ref 5–15)
BUN: 40 mg/dL — ABNORMAL HIGH (ref 6–20)
CO2: 27 mmol/L (ref 22–32)
Calcium: 9.7 mg/dL (ref 8.9–10.3)
Chloride: 115 mmol/L — ABNORMAL HIGH (ref 98–111)
Creatinine, Ser: 0.38 mg/dL — ABNORMAL LOW (ref 0.61–1.24)
GFR calc Af Amer: >60 mL/min (ref >60)
GFR calc non Af Amer: >60 mL/min (ref >60)
Glucose, Bld: 146 mg/dL — ABNORMAL HIGH (ref 70–99)
Potassium: 3.3 mmol/L — ABNORMAL LOW (ref 3.5–5.1)
Sodium: 148 mmol/L — ABNORMAL HIGH (ref 135–145)

## 2018-09-15 LAB — GLUCOSE, CAPILLARY
Glucose-Capillary: 148 mg/dL — ABNORMAL HIGH (ref 70–99)
Glucose-Capillary: 148 mg/dL — ABNORMAL HIGH (ref 70–99)
Glucose-Capillary: 169 mg/dL — ABNORMAL HIGH (ref 70–99)
Glucose-Capillary: 160 mg/dL — ABNORMAL HIGH (ref 70–99)
Glucose-Capillary: 191 mg/dL — ABNORMAL HIGH (ref 70–99)
Glucose-Capillary: 138 mg/dL — ABNORMAL HIGH (ref 70–99)

## 2018-09-15 MED ORDER — POTASSIUM CHLORIDE 20 MEQ/15ML (10%) PO SOLN
40.0000 meq | Freq: Two times a day (BID) | ORAL | Status: DC
  Administered 2018-09-15 – 2018-09-23 (×17): 40 meq via ORAL
  Filled 2018-09-15 (×18): qty 30

## 2018-09-15 MED ORDER — SODIUM CHLORIDE 0.9 % IV SOLN
INTRAVENOUS | Status: DC
  Administered 2018-09-15: 17:00:00 via INTRAVENOUS

## 2018-09-15 MED ORDER — CHLORHEXIDINE GLUCONATE CLOTH 2 % EX PADS
6.0000 | MEDICATED_PAD | Freq: Every day | CUTANEOUS | Status: DC
  Administered 2018-09-16 – 2018-10-16 (×29): 6 via TOPICAL
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
## 2018-09-16 LAB — BASIC METABOLIC PANEL
Anion gap: 5 (ref 5–15)
BUN: 37 mg/dL — ABNORMAL HIGH (ref 6–20)
CO2: 28 mmol/L (ref 22–32)
Calcium: 10.1 mg/dL (ref 8.9–10.3)
Chloride: 117 mmol/L — ABNORMAL HIGH (ref 98–111)
Creatinine, Ser: 0.41 mg/dL — ABNORMAL LOW (ref 0.61–1.24)
GFR calc Af Amer: >60 mL/min (ref >60)
GFR calc non Af Amer: >60 mL/min (ref >60)
Glucose, Bld: 155 mg/dL — ABNORMAL HIGH (ref 70–99)
Potassium: 3.6 mmol/L (ref 3.5–5.1)
Sodium: 150 mmol/L — ABNORMAL HIGH (ref 135–145)

## 2018-09-16 LAB — GLUCOSE, CAPILLARY
Glucose-Capillary: 173 mg/dL — ABNORMAL HIGH (ref 70–99)
Glucose-Capillary: 146 mg/dL — ABNORMAL HIGH (ref 70–99)
Glucose-Capillary: 129 mg/dL — ABNORMAL HIGH (ref 70–99)
Glucose-Capillary: 166 mg/dL — ABNORMAL HIGH (ref 70–99)
Glucose-Capillary: 165 mg/dL — ABNORMAL HIGH (ref 70–99)
Glucose-Capillary: 161 mg/dL — ABNORMAL HIGH (ref 70–99)

## 2018-09-16 MED ORDER — SODIUM CHLORIDE 0.9 % IV SOLN
INTRAVENOUS | Status: DC | PRN
  Administered 2018-09-16 (×2): 250 mL via INTRAVENOUS
  Administered 2018-10-10: 1000 mL via INTRAVENOUS
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
## 2018-09-17 LAB — BASIC METABOLIC PANEL
Anion gap: 5 (ref 5–15)
BUN: 39 mg/dL — ABNORMAL HIGH (ref 6–20)
CO2: 27 mmol/L (ref 22–32)
Calcium: 10.3 mg/dL (ref 8.9–10.3)
Chloride: 117 mmol/L — ABNORMAL HIGH (ref 98–111)
Creatinine, Ser: 0.31 mg/dL — ABNORMAL LOW (ref 0.61–1.24)
GFR calc Af Amer: >60 mL/min (ref >60)
GFR calc non Af Amer: >60 mL/min (ref >60)
Glucose, Bld: 178 mg/dL — ABNORMAL HIGH (ref 70–99)
Potassium: 3.8 mmol/L (ref 3.5–5.1)
Sodium: 149 mmol/L — ABNORMAL HIGH (ref 135–145)

## 2018-09-17 LAB — GLUCOSE, CAPILLARY
Glucose-Capillary: 136 mg/dL — ABNORMAL HIGH (ref 70–99)
Glucose-Capillary: 165 mg/dL — ABNORMAL HIGH (ref 70–99)
Glucose-Capillary: 142 mg/dL — ABNORMAL HIGH (ref 70–99)
Glucose-Capillary: 168 mg/dL — ABNORMAL HIGH (ref 70–99)
Glucose-Capillary: 167 mg/dL — ABNORMAL HIGH (ref 70–99)
Glucose-Capillary: 155 mg/dL — ABNORMAL HIGH (ref 70–99)

## 2018-09-17 MED ORDER — FUROSEMIDE 10 MG/ML IJ SOLN
40.0000 mg | Freq: Once | INTRAMUSCULAR | Status: AC
  Administered 2018-09-17: 40 mg via INTRAVENOUS
  Filled 2018-09-17: qty 4
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
## 2018-09-17 NOTE — Care Management (Signed)
Faxed clinical update to Laretta Bolster, [facility] Case Manager.    Quintella Baton, RN, BSN  Trauma/Neuro ICU Case Manager [phone]
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
## 2018-09-18 LAB — BASIC METABOLIC PANEL
Anion gap: 4 — ABNORMAL LOW (ref 5–15)
BUN: 45 mg/dL — ABNORMAL HIGH (ref 6–20)
CO2: 28 mmol/L (ref 22–32)
Calcium: 10 mg/dL (ref 8.9–10.3)
Chloride: 119 mmol/L — ABNORMAL HIGH (ref 98–111)
Creatinine, Ser: <0.3 mg/dL — ABNORMAL LOW (ref 0.61–1.24)
Glucose, Bld: 198 mg/dL — ABNORMAL HIGH (ref 70–99)
Potassium: 3.5 mmol/L (ref 3.5–5.1)
Sodium: 151 mmol/L — ABNORMAL HIGH (ref 135–145)

## 2018-09-18 LAB — GLUCOSE, CAPILLARY
Glucose-Capillary: 158 mg/dL — ABNORMAL HIGH (ref 70–99)
Glucose-Capillary: 182 mg/dL — ABNORMAL HIGH (ref 70–99)
Glucose-Capillary: 169 mg/dL — ABNORMAL HIGH (ref 70–99)
Glucose-Capillary: 157 mg/dL — ABNORMAL HIGH (ref 70–99)
Glucose-Capillary: 158 mg/dL — ABNORMAL HIGH (ref 70–99)
Glucose-Capillary: 199 mg/dL — ABNORMAL HIGH (ref 70–99)

## 2018-09-18 LAB — CBC
HCT: 29 % — ABNORMAL LOW (ref 39.0–52.0)
Hemoglobin: 8.2 g/dL — ABNORMAL LOW (ref 13.0–17.0)
MCH: 30.6 pg (ref 26.0–34.0)
MCHC: 28.3 g/dL — ABNORMAL LOW (ref 30.0–36.0)
MCV: 108.2 fL — ABNORMAL HIGH (ref 80.0–100.0)
Platelets: 398 10*3/uL (ref 150–400)
RBC: 2.68 MIL/uL — ABNORMAL LOW (ref 4.22–5.81)
RDW: 17.2 % — ABNORMAL HIGH (ref 11.5–15.5)
WBC: 8.4 10*3/uL (ref 4.0–10.5)
nRBC: 0 % (ref 0.0–0.2)

## 2018-09-18 MED ORDER — ZINC SULFATE 220 (50 ZN) MG PO CAPS
220.0000 mg | ORAL_CAPSULE | Freq: Every day | ORAL | Status: AC
  Administered 2018-09-18 – 2018-10-01 (×13): 220 mg
  Filled 2018-09-18 (×14): qty 1

## 2018-09-18 MED ORDER — VITAMIN C 500 MG PO TABS
500.0000 mg | ORAL_TABLET | Freq: Two times a day (BID) | ORAL | Status: DC
  Administered 2018-09-18 – 2018-10-16 (×54): 500 mg
  Filled 2018-09-18 (×54): qty 1

## 2018-09-18 MED ORDER — CHLORHEXIDINE GLUCONATE CLOTH 2 % EX PADS: 6.0000 | MEDICATED_PAD | Freq: Once | CUTANEOUS | Status: DC

## 2018-09-18 MED ORDER — CHLORHEXIDINE GLUCONATE CLOTH 2 % EX PADS
6.0000 | MEDICATED_PAD | Freq: Once | CUTANEOUS | Status: AC
  Administered 2018-09-18: 6 via TOPICAL

## 2018-09-18 MED ORDER — VITAL 1.5 CAL PO LIQD
1000.0000 mL | ORAL | Status: DC
  Administered 2018-09-18 – 2018-10-16 (×34): 1000 mL
  Filled 2018-09-18 (×67): qty 1000

## 2018-09-18 MED ORDER — CEFAZOLIN SODIUM-DEXTROSE 2-4 GM/100ML-% IV SOLN
2.0000 g | INTRAVENOUS | Status: AC
  Filled 2018-09-18: qty 100

## 2018-09-18 MED ORDER — PIVOT 1.5 CAL PO LIQD: 1000.0000 mL | ORAL | Status: AC
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
## 2018-09-19 ENCOUNTER — Inpatient Hospital Stay (HOSPITAL_COMMUNITY): Payer: No Typology Code available for payment source | Admitting: Certified Registered Nurse Anesthetist

## 2018-09-19 ENCOUNTER — Encounter (HOSPITAL_COMMUNITY): Payer: Self-pay | Admitting: Certified Registered Nurse Anesthetist

## 2018-09-19 ENCOUNTER — Encounter (HOSPITAL_COMMUNITY)
Admission: EM | Disposition: A | Discharge status: Nursing Home (Includes ICF) | Payer: Self-pay | Source: Home / Self Care

## 2018-09-19 DIAGNOSIS — S31829A Unspecified open wound of left buttock, initial encounter: Secondary | ICD-10-CM

## 2018-09-19 DIAGNOSIS — S31819A Unspecified open wound of right buttock, initial encounter: Secondary | ICD-10-CM

## 2018-09-19 DIAGNOSIS — S3130XA Unspecified open wound of scrotum and testes, initial encounter: Secondary | ICD-10-CM

## 2018-09-19 DIAGNOSIS — S71102A Unspecified open wound, left thigh, initial encounter: Secondary | ICD-10-CM

## 2018-09-19 HISTORY — PX: IRRIGATION AND DEBRIDEMENT OF WOUND WITH SPLIT THICKNESS SKIN GRAFT: SHX5879

## 2018-09-19 HISTORY — PX: SKIN SPLIT GRAFT: SHX444

## 2018-09-19 LAB — GLUCOSE, CAPILLARY
Glucose-Capillary: 140 mg/dL — ABNORMAL HIGH (ref 70–99)
Glucose-Capillary: 109 mg/dL — ABNORMAL HIGH (ref 70–99)
Glucose-Capillary: 115 mg/dL — ABNORMAL HIGH (ref 70–99)
Glucose-Capillary: 131 mg/dL — ABNORMAL HIGH (ref 70–99)
Glucose-Capillary: 113 mg/dL — ABNORMAL HIGH (ref 70–99)
Glucose-Capillary: 165 mg/dL — ABNORMAL HIGH (ref 70–99)

## 2018-09-19 SURGERY — IRRIGATION AND DEBRIDEMENT OF WOUND WITH SPLIT THICKNESS SKIN GRAFT
Anesthesia: General | Site: Thigh | Laterality: Right

## 2018-09-19 MED ORDER — ONDANSETRON HCL 4 MG/2ML IJ SOLN
INTRAMUSCULAR | Status: DC | PRN
  Administered 2018-09-19: 4 mg via INTRAVENOUS

## 2018-09-19 MED ORDER — BUPIVACAINE-EPINEPHRINE 0.25% -1:200000 IJ SOLN
INTRAMUSCULAR | Status: DC | PRN
  Administered 2018-09-19: 15 mL

## 2018-09-19 MED ORDER — FENTANYL CITRATE (PF) 250 MCG/5ML IJ SOLN
INTRAMUSCULAR | Status: AC
  Filled 2018-09-19: qty 5

## 2018-09-19 MED ORDER — EPINEPHRINE PF 1 MG/ML IJ SOLN
INTRAMUSCULAR | Status: AC
  Filled 2018-09-19: qty 1

## 2018-09-19 MED ORDER — EVICEL 5 ML EX KIT
PACK | CUTANEOUS | Status: AC
  Filled 2018-09-19: qty 1

## 2018-09-19 MED ORDER — PROPOFOL 10 MG/ML IV BOLUS
INTRAVENOUS | Status: DC | PRN
  Administered 2018-09-19: 30 mg via INTRAVENOUS
  Administered 2018-09-19: 20 mg via INTRAVENOUS

## 2018-09-19 MED ORDER — SODIUM CHLORIDE (PF) 0.9 % IJ SOLN
INTRAMUSCULAR | Status: AC
  Filled 2018-09-19: qty 10

## 2018-09-19 MED ORDER — SODIUM CHLORIDE 0.9 % IV SOLN
INTRAVENOUS | Status: AC
  Filled 2018-09-19: qty 500000

## 2018-09-19 MED ORDER — 0.9 % SODIUM CHLORIDE (POUR BTL) OPTIME
TOPICAL | Status: DC | PRN
  Administered 2018-09-19 (×2): 1000 mL

## 2018-09-19 MED ORDER — CEFAZOLIN SODIUM-DEXTROSE 2-3 GM-%(50ML) IV SOLR
INTRAVENOUS | Status: DC | PRN
  Administered 2018-09-19: 2 g via INTRAVENOUS

## 2018-09-19 MED ORDER — PROPOFOL 10 MG/ML IV BOLUS
INTRAVENOUS | Status: AC
  Filled 2018-09-19: qty 40

## 2018-09-19 MED ORDER — SODIUM CHLORIDE 0.9 % IV SOLN
INTRAVENOUS | Status: DC | PRN
  Administered 2018-09-19: 500 mL

## 2018-09-19 MED ORDER — LACTATED RINGERS IV SOLN
INTRAVENOUS | Status: DC | PRN
  Administered 2018-09-19: 13:00:00 via INTRAVENOUS

## 2018-09-19 MED ORDER — MIDAZOLAM HCL 2 MG/2ML IJ SOLN
INTRAMUSCULAR | Status: AC
  Filled 2018-09-19: qty 2

## 2018-09-19 MED ORDER — SODIUM CHLORIDE 0.9 % IV SOLN
INTRAVENOUS | Status: DC | PRN
  Administered 2018-09-19: 16:00:00 via INTRAVENOUS

## 2018-09-19 MED ORDER — CEFAZOLIN SODIUM 1 G IJ SOLR
INTRAMUSCULAR | Status: AC
  Filled 2018-09-19: qty 20

## 2018-09-19 MED ORDER — MIDAZOLAM HCL 2 MG/2ML IJ SOLN
INTRAMUSCULAR | Status: DC | PRN
  Administered 2018-09-19: 2 mg via INTRAVENOUS

## 2018-09-19 MED ORDER — EVICEL 5 ML EX KIT
PACK | CUTANEOUS | Status: DC | PRN
  Administered 2018-09-19: 5 mL

## 2018-09-19 MED ORDER — ONDANSETRON HCL 4 MG/2ML IJ SOLN
INTRAMUSCULAR | Status: AC
  Filled 2018-09-19: qty 2

## 2018-09-19 MED ORDER — EPINEPHRINE 1 MG/10ML IJ SOSY
PREFILLED_SYRINGE | INTRAMUSCULAR | Status: AC
  Filled 2018-09-19: qty 10

## 2018-09-19 MED ORDER — BUPIVACAINE-EPINEPHRINE (PF) 0.25% -1:200000 IJ SOLN
INTRAMUSCULAR | Status: AC
  Filled 2018-09-19: qty 30

## 2018-09-19 MED ORDER — FENTANYL CITRATE (PF) 250 MCG/5ML IJ SOLN
INTRAMUSCULAR | Status: DC | PRN
  Administered 2018-09-19 (×2): 50 ug via INTRAVENOUS
  Administered 2018-09-19: 75 ug via INTRAVENOUS
  Administered 2018-09-19 (×4): 50 ug via INTRAVENOUS
  Administered 2018-09-19: 75 ug via INTRAVENOUS
  Administered 2018-09-19 (×6): 50 ug via INTRAVENOUS

## 2018-09-19 MED ORDER — SODIUM CHLORIDE (PF) 0.9 % IJ SOLN
INTRAMUSCULAR | Status: DC | PRN
  Administered 2018-09-19: 15 mL

## 2018-09-19 SURGICAL SUPPLY — 64 items
BANDAGE ACE 4X5 VEL STRL LF (GAUZE/BANDAGES/DRESSINGS) IMPLANT
BANDAGE ACE 6X5 VEL STRL LF (GAUZE/BANDAGES/DRESSINGS) IMPLANT
BENZOIN TINCTURE PRP APPL 2/3 (GAUZE/BANDAGES/DRESSINGS) ×6 IMPLANT
BLADE 10 SAFETY STRL DISP (BLADE) ×3 IMPLANT
BLADE CLIPPER SURG (BLADE) IMPLANT
BLADE DERMATOME SS (BLADE) ×6 IMPLANT
CANISTER SUCT 3000ML PPV (MISCELLANEOUS) IMPLANT
CANISTER WOUND CARE 500ML ATS (WOUND CARE) IMPLANT
COVER SURGICAL LIGHT HANDLE (MISCELLANEOUS) ×3 IMPLANT
COVER WAND RF STERILE (DRAPES) IMPLANT
DERMACARRIERS GRAFT 1 TO 1.5 (DISPOSABLE) ×9
DRAPE HALF SHEET 40X57 (DRAPES) ×3 IMPLANT
DRAPE INCISE IOBAN 66X45 STRL (DRAPES) ×3 IMPLANT
DRAPE ORTHO SPLIT 77X108 STRL (DRAPES) ×8
DRAPE SURG ORHT 6 SPLT 77X108 (DRAPES) ×8 IMPLANT
DRSG ADAPTIC 3X8 NADH LF (GAUZE/BANDAGES/DRESSINGS) ×18 IMPLANT
DRSG CUTIMED SORBACT 7X9 (GAUZE/BANDAGES/DRESSINGS) ×18 IMPLANT
DRSG OPSITE 6X11 MED (GAUZE/BANDAGES/DRESSINGS) IMPLANT
DRSG PAD ABDOMINAL 8X10 ST (GAUZE/BANDAGES/DRESSINGS) ×9 IMPLANT
DRSG TELFA 3X8 NADH (GAUZE/BANDAGES/DRESSINGS) ×6 IMPLANT
DRSG VAC ATS LRG SENSATRAC (GAUZE/BANDAGES/DRESSINGS) IMPLANT
DRSG VAC ATS MED SENSATRAC (GAUZE/BANDAGES/DRESSINGS) IMPLANT
DRSG VAC ATS SM SENSATRAC (GAUZE/BANDAGES/DRESSINGS) IMPLANT
ELECT REM PT RETURN 9FT ADLT (ELECTROSURGICAL)
ELECTRODE REM PT RTRN 9FT ADLT (ELECTROSURGICAL) IMPLANT
FILTER STRAW FLUID ASPIR (MISCELLANEOUS) ×3 IMPLANT
GAUZE SPONGE 4X4 12PLY STRL (GAUZE/BANDAGES/DRESSINGS) ×12 IMPLANT
GAUZE SPONGE 4X4 12PLY STRL LF (GAUZE/BANDAGES/DRESSINGS) ×3 IMPLANT
GAUZE XEROFORM 5X9 LF (GAUZE/BANDAGES/DRESSINGS) ×3 IMPLANT
GEL ULTRASOUND 20GR AQUASONIC (MISCELLANEOUS) IMPLANT
GLOVE BIO SURGEON STRL SZ 6.5 (GLOVE) ×36 IMPLANT
GOWN STRL REUS W/ TWL LRG LVL3 (GOWN DISPOSABLE) ×4 IMPLANT
GOWN STRL REUS W/TWL LRG LVL3 (GOWN DISPOSABLE) ×2
GRAFT DERMACARRIERS 1 TO 1.5 (DISPOSABLE) ×6 IMPLANT
HANDPIECE INTERPULSE COAX TIP (DISPOSABLE)
KIT BASIN OR (CUSTOM PROCEDURE TRAY) ×3 IMPLANT
KIT TURNOVER KIT B (KITS) ×3 IMPLANT
MATRIX SURG PERF 3LAYER 16X25 (Tissue) ×3 IMPLANT
MATRIX WOUND 1-LAYER 7X10 (Mesh Specialty) ×12 IMPLANT
MATRIX WOUND 2-LAYER 7X10CM (Tissue) ×9 IMPLANT
MATRIX WOUND 3-LAYER 10X15 (Tissue) ×9 IMPLANT
MATRIX WOUND 3-LAYER 5X5 (Tissue) ×6 IMPLANT
MICROMATRIX 1000MG (Tissue) ×6 IMPLANT
NEEDLE HYPO 25GX1X1/2 BEV (NEEDLE) ×3 IMPLANT
NEEDLE SPNL 18GX3.5 QUINCKE PK (NEEDLE) ×3 IMPLANT
NS IRRIG 1000ML POUR BTL (IV SOLUTION) ×3 IMPLANT
PACK GENERAL/GYN (CUSTOM PROCEDURE TRAY) ×3 IMPLANT
PAD ABD 8X10 STRL (GAUZE/BANDAGES/DRESSINGS) ×30 IMPLANT
PAD ARMBOARD 7.5X6 YLW CONV (MISCELLANEOUS) ×6 IMPLANT
SET HNDPC FAN SPRY TIP SCT (DISPOSABLE) IMPLANT
SOLUTION PARTIC MCRMTRX 1000MG (Tissue) ×4 IMPLANT
STAPLER VISISTAT 35W (STAPLE) ×3 IMPLANT
SURGILUBE 2OZ TUBE FLIPTOP (MISCELLANEOUS) ×15 IMPLANT
SUT CHROMIC 4 0 PS 2 18 (SUTURE) IMPLANT
SUT SILK 4 0 PS 2 (SUTURE) IMPLANT
SUT VIC AB 5-0 P-3 18XBRD (SUTURE) IMPLANT
SUT VIC AB 5-0 P3 18 (SUTURE)
SUT VIC AB 5-0 PS2 18 (SUTURE) ×105 IMPLANT
SYR 3ML 25GX5/8 SAFETY (SYRINGE) ×3 IMPLANT
SYR CONTROL 10ML LL (SYRINGE) ×3 IMPLANT
TAPE CLOTH SURG 6X10 WHT LF (GAUZE/BANDAGES/DRESSINGS) ×6 IMPLANT
TOWEL OR 17X24 6PK STRL BLUE (TOWEL DISPOSABLE) ×3 IMPLANT
TOWEL OR 17X26 10 PK STRL BLUE (TOWEL DISPOSABLE) ×3 IMPLANT
UNDERPAD 30X30 (UNDERPADS AND DIAPERS) ×3 IMPLANT

## 2018-09-19 NOTE — Progress Notes (Signed)
SLP Cancellation Note  Patient Details Name: Christopher [name] MRN: [id number] DOB: 1965/07/29   Cancelled treatment:       Reason Eval/Treat Not Completed: Medical issues which prohibited therapy. Checked in with RN for potential inline PMV placement today but she states that plan is for him to go to the OR. Will continue efforts.   Virl Axe Johnye Kist 09/19/2018, 10:36 AM  Ivar Drape, M.A. CCC-SLP Acute Herbalist [phone] Office [phone]
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
## 2018-09-19 NOTE — Progress Notes (Signed)
PT Cancellation Note  Patient Details Name: Christopher [name] MRN: [id number] DOB: [birth year]   Cancelled Treatment:    Reason Eval/Treat Not Completed: Patient at procedure or test/unavailable.  Plastics has taken pt to surgery for grafting.  RN stated hold until tomorrow. 09/19/2018  [place] Bing, PT Acute Rehabilitation Services [phone]  (pager) [phone]  (office)   Eliseo Gum Philena Obey 09/19/2018, 12:52 PM

## 2018-09-19 NOTE — Progress Notes (Signed)
Left message at [phone] for Dr. Ulice Bold for clarification on wound changes after skin graft today. Awaiting call back.

## 2018-09-19 NOTE — Progress Notes (Signed)
RN in room pt had vomiting episode. Pt has been NPO since Midnight.  Dr. Janee Morn made aware. MD states pt has done this in the past.  Also Dr. Ulice Bold came to beside to see pt prior to surgery and made aware. PRN med given. Will continue to monitor.

## 2018-09-19 NOTE — Anesthesia Procedure Notes (Signed)
Date/Time: 09/19/2018 12:55 PM Performed by: Mayer Camel, CRNA Pre-anesthesia Checklist: Patient identified, Emergency Drugs available, Suction available and Patient being monitored Patient Re-evaluated:Patient Re-evaluated prior to induction Oxygen Delivery Method: Circle system utilized Induction Type: Inhalational induction and Tracheostomy Airway Equipment and Method: Tracheostomy Placement Confirmation: positive ETCO2 Dental Injury: Teeth and Oropharynx as per pre-operative assessment  Comments: Patient tracheostomy connected to anesthesia ventilator, no complications.  Obturator in room.

## 2018-09-19 NOTE — Op Note (Signed)
DATE OF OPERATION: 09/19/2018  LOCATION: [facility] Inpatient  PREOPERATIVE DIAGNOSIS: Wound of left leg and gluteal area  POSTOPERATIVE DIAGNOSIS: Same  PROCEDURE:  1. Preparation of gluteal area for placement of Acell sheets 10 x 15 cm two, 16 x 25 cm, 5 x 5 cm and 1 gm powder 2. Preparation of scrotum for placement of Acell sheets 5 x 5 cm 3. Split thickness skin graft to left anterior leg 700 cm2 4. Acell placement to right leg donor site sheet 7 x 10 cm (1 layer) four and 7 x 10 cm (2 layer) three 5. Preparation of lateral left leg for placement of Acell sheet 10 x 15 cm and 1 gm powder 6. Excision of 5 mm of coccyx bone  SURGEON: Claire Sanger Dillingham, DO  ASSISTANT: Bonita Cox, RNFA  EBL: 20 cc  CONDITION: Stable  COMPLICATIONS: None  INDICATION: The patient, Christopher [name], is a 53 y.o. male born on 1965/09/21, is here for treatment of gluteal and left leg wound.   PROCEDURE DETAILS:  The patient was seen prior to surgery and marked.  The IV antibiotics were given. The patient was taken to the operating room and given a general anesthetic. A standard time out was performed and all information was confirmed by those in the room. SCDs were placed.   The patient was placed in the right lateral position.  All bony prominences were padded.  The patient was prepped and draped.  The gluteal area was inspected and there was a 2 x 3 area of exposed bone at at the coccyx.  The tip was rongeured to excise the 5 mm exposed bone.  There was a 2 x 3 cm of exposed bone on the left cephalad portion of the gluteal wound.  There was a small amount of hardware exposed.  It was improved since last surgical case.  It did not appear to be infected due to the exposed bone and the decision was made not to skin graft the gluteal area today.  The area was further cleaned with saline and antibiotic solution.  The ACell sheet and 1 g of powder was applied and secured to the gluteal area with  5-0 Vicryl.  This included 30 x 40 cm.  The ACell was then covered with the sore back and secured with 5-0 Vicryl.  KY gel Kerlix and ABDs were secured over the wound.  The patient was then placed in the supine position.  The patient was prepped and draped again to include bilateral legs and scrotal area.  The scrotal area was irrigated with antibiotic and saline solution.  The 5 x 5 cm sheet of ACell was applied to the scrotal area and secured with a 5-0 Vicryl.  It was then covered with ACell which was secured in the same fashion.  This was dressed with and gauze.  The right leg was injected in the subcutaneous area with local for postop pain control.  The dermatome was set at 05/999 inch.  The dermatome was then used to obtain the split thickness skin graft from the right leg.  The skin was then placed through the 1.5 x 1 mesher.  The donor site was covered with Acell for a total of 4 pieces of one layer 7 x 10 sheets and 3 pieces that were 7 x 10 cm at 2 layers.  These were secured with the 5-0 Vicryl to the skin.  The right leg was then covered with sore back which was secured  in the same fashion.  KY Kerlix and ABDs were used to cover the area.  The 700 cm area of split-thickness skin graft was placed on the left leg and secured with the 5-0 Vicryl and fibrin glue.  The skin graft was covered with Adaptic which was secured into place.  The area was then covered with gauze and ABDs.  There was a portion of the left lateral leg that abutted the posterior aspect and then joined the gluteal area.  The 10 x 15 cm sheet of ACell and 1 g of powder was applied.  This was secured with a 5-0 Vicryl.  The ACell was covered with sore back, KY gel and gauze.  The patient was allowed to wake up and taken to recovery room in stable condition at the end of the case. The family was notified at the end of the case. The RNFA assisted throughout the case.  The RNFA was essential in retraction and counter traction when  needed to make the case progress smoothly.  This retraction and assistance made it possible to see the tissue plans for the procedure.  The assistance was needed for blood control, tissue re-approximation and assisted with closure of the incision site.

## 2018-09-19 NOTE — H&P (Signed)
Christopher FreestoneJames Bryan [name] is an 53 y.o. male.   Chief Complaint: left and gluteal wound HPI: The patient is a 53 yrs old wm here for further treatment of the gluteal and left leg wound.  He has been doing well in the ICU and becoming more responsive, following commands.  Had several debridements and acell placements. Plan for further treatment today.  History reviewed. No pertinent past medical history.  Past Surgical History:  Procedure Laterality Date  . APPLICATION OF A-CELL OF BACK N/A 08/06/2018   Procedure: Application Of A-Cell Of Back;  Surgeon: Peggye Formillingham, Jaleeya Mcnelly S, DO;  Location: [facility] OR;  Service: Plastics;  Laterality: N/A;  . APPLICATION OF A-CELL OF EXTREMITY Left 08/06/2018   Procedure: Application Of A-Cell Of Extremity;  Surgeon: Peggye Formillingham, Jonia Oakey S, DO;  Location: [facility] OR;  Service: Plastics;  Laterality: Left;  . APPLICATION OF WOUND VAC  07/12/2018   Procedure: Application Of Wound Vac to the Left Thigh and Scrotum.;  Surgeon: Roby LoftsHaddix, Kevin P, MD;  Location: [facility] OR;  Service: Orthopedics;;  . APPLICATION OF WOUND VAC  07/10/2018   Procedure: Application Of Wound Vac;  Surgeon: Berna Bueonnor, Chelsea A, MD;  [facility];  Service: General;;  . COLOSTOMY N/A 07/23/2018   Procedure: COLOSTOMY;  Surgeon: Violeta Gelinashompson, Burke, MD;  [facility];  Service: General;  Laterality: N/A;  . CYSTOSCOPY W/ URETERAL STENT PLACEMENT N/A 07/15/2018   Procedure: RETROGRADE URETHROGRAM;  Surgeon: Marcine Matarahlstedt, Stephen, MD;  [facility];  Service: Urology;  Laterality: N/A;  . ESOPHAGOGASTRODUODENOSCOPY N/A 08/14/2018   Procedure: ESOPHAGOGASTRODUODENOSCOPY (EGD);  Surgeon: Violeta Gelinashompson, Burke, MD;  [facility];  Service: General;  Laterality: N/A;  bedside  . HIP PINNING,CANNULATED Left 07/12/2018   Procedure: CANNULATED HIP PINNING;  Surgeon: Roby LoftsHaddix, Kevin P, MD;  Location: [facility] OR;  Service: Orthopedics;  Laterality: Left;  . I&D EXTREMITY Left 07/25/2018   Procedure: Debridement of buttock, scrotum and  left leg, placement of acell and vac;  Surgeon: Peggye Formillingham, Dakotah Orrego S, DO;  Location: [facility];  Service: Plastics;  Laterality: Left;  . I&D EXTREMITY N/A 08/06/2018   Procedure: Debridement of buttock, scrotum and left leg;  Surgeon: Peggye Formillingham, Gini Caputo S, DO;  Location: [facility];  Service: Plastics;  Laterality: N/A;  . I&D EXTREMITY N/A 08/13/2018   Procedure: Debridement of buttock, scrotum and left leg, placement of acell and vac;  Surgeon: Peggye Formillingham, Forever Arechiga S, DO;  Location: [facility] OR;  Service: Plastics;  Laterality: N/A;  90 min, please  . INCISION AND DRAINAGE OF WOUND N/A 07/18/2018   Procedure: Debridement of left leg, buttocks and scrotal wound with placement of acell and Flexiseal;  Surgeon: Peggye Formillingham, Stanislaus Kaltenbach S, DO;  Location: [facility] OR;  Service: Plastics;  Laterality: N/A;  . INCISION AND DRAINAGE OF WOUND Left 08/29/2018   Procedure: Debridement of buttock, scrotum and left leg, placement of acell and vac;  Surgeon: Peggye Formillingham, Osamu Olguin S, DO;  Location: [facility] OR;  Service: Plastics;  Laterality: Left;  75 min, please  . IR ANGIOGRAM PELVIS SELECTIVE OR SUPRASELECTIVE  07/10/2018  . IR ANGIOGRAM PELVIS SELECTIVE OR SUPRASELECTIVE  07/10/2018  . IR ANGIOGRAM SELECTIVE EACH ADDITIONAL VESSEL  07/10/2018  . IR EMBO ART  VEN HEMORR LYMPH EXTRAV  INC GUIDE ROADMAPPING  07/10/2018  . IR US GUIDE BX ASP/DRAIN  07/10/2018  . IR US GUIDE VASC ACCESS RIGHT  07/10/2018  . IR VENO/EXT/UNI LEFT  07/10/2018  . LAPAROTOMY N/A 07/12/2018   Procedure: EXPLORATORY LAPAROTOMY;  Surgeon: Violeta Gelinashompson, Burke, MD;  Location: [facility] OR;  Service: General;  Laterality: N/A;  . LAPAROTOMY N/A 07/15/2018   Procedure: WOUND EXPLORATION; CLOSURE OF ABDOMEN;  Surgeon: Violeta Gelinas, MD;  Location: [facility] OR;  Service: General;  Laterality: N/A;  . LAPAROTOMY  07/10/2018   Procedure: Exploratory Laparotomy;  Surgeon: Berna Bue, MD;  Location[facility] OR;  Service: General;;  . PEG PLACEMENT N/A 08/14/2018   Procedure: PERCUTANEOUS ENDOSCOPIC  GASTROSTOMY (PEG) PLACEMENT;  Surgeon: Violeta Gelinas, MD;  Location: [facility] ENDOSCOPY;  Service: General;  Laterality: N/A;  . PERCUTANEOUS TRACHEOSTOMY N/A 08/02/2018   Procedure: PERCUTANEOUS TRACHEOSTOMY;  Surgeon: Violeta Gelinas, MD;  Location: [facility] OR;  Service: General;  Laterality: N/A;  . RADIOLOGY WITH ANESTHESIA N/A 07/10/2018   Procedure: IR WITH ANESTHESIA;  Surgeon: Simonne Come, MD;  Location: [facility] OR;  Service: Radiology;  Laterality: N/A;  . RADIOLOGY WITH ANESTHESIA Right 07/10/2018   Procedure: Ir With Anesthesia;  Surgeon: Simonne Come, MD;  Location[facility] OR;  Service: Radiology;  Laterality: Right;  . SCROTAL EXPLORATION N/A 07/15/2018   Procedure: SCROTUM DEBRIDEMENT;  Surgeon: Marcine Matar, MD;  Location: [facility] OR;  Service: Urology;  Laterality: N/A;  . VACUUM ASSISTED CLOSURE CHANGE N/A 07/12/2018   Procedure: ABDOMINAL VACUUM ASSISTED CLOSURE CHANGE and abdominal washout;  Surgeon: Violeta Gelinas, MD;  Location: [facility] OR;  Service: General;  Laterality: N/A;  . WOUND DEBRIDEMENT Left 07/23/2018   Procedure: DEBRIDEMENT LEFT BUTTOCK  WOUND;  Surgeon: Violeta Gelinas, MD;  Location: [facility] OR;  Service: General;  Laterality: Left;  . WOUND EXPLORATION Left 07/10/2018   Procedure: WOUND EXPLORATION LEFT GROIN;  Surgeon: Berna Bue, MD;  Location: [facility] OR;  Service: General;  Laterality: Left;    History reviewed. No pertinent family history. Social History:  reports that he has been smoking cigarettes. He has a 20.00 pack-year smoking history. He has never used smokeless tobacco. He reports that he does not drink alcohol or use drugs.  Allergies:  Allergies  Allergen Reactions  . Oxycodone     vomiting    Medications Prior to Admission  Medication Sig Dispense Refill  . ibuprofen (ADVIL,MOTRIN) 200 MG tablet Take 400 mg by mouth every 6 (six) hours as needed for headache or mild pain.      Results for orders placed or performed during the hospital encounter of 07/10/18 (from  the past 48 hour(s))  Glucose, capillary     Status: Abnormal   Collection Time: 09/17/18  3:20 PM  Result Value Ref Range   Glucose-Capillary 168 (H) 70 - 99 mg/dL   Comment 1 Notify RN    Comment 2 Document in Chart   Glucose, capillary     Status: Abnormal   Collection Time: 09/17/18  7:30 PM  Result Value Ref Range   Glucose-Capillary 167 (H) 70 - 99 mg/dL  Glucose, capillary     Status: Abnormal   Collection Time: 09/17/18 11:11 PM  Result Value Ref Range   Glucose-Capillary 155 (H) 70 - 99 mg/dL  Glucose, capillary     Status: Abnormal   Collection Time: 09/18/18  3:06 AM  Result Value Ref Range   Glucose-Capillary 158 (H) 70 - 99 mg/dL  CBC     Status: Abnormal   Collection Time: 09/18/18  4:13 AM  Result Value Ref Range   WBC 8.4 4.0 - 10.5 K/uL   RBC 2.68 (L) 4.22 - 5.81 MIL/uL   Hemoglobin 8.2 (L) 13.0 - 17.0 g/dL   HCT 16.1 (L) 09.6 - 04.5 %  MCV 108.2 (H) 80.0 - 100.0 fL   MCH 30.6 26.0 - 34.0 pg   MCHC 28.3 (L) 30.0 - 36.0 g/dL   RDW 02.6 (H) 37.8 - 58.8 %   Platelets 398 150 - 400 K/uL   nRBC 0.0 0.0 - 0.2 %    Comment: Performed at [facility], [address], [place], [place] [address]  Basic metabolic panel     Status: Abnormal   Collection Time: 09/18/18  4:13 AM  Result Value Ref Range   Sodium 151 (H) 135 - 145 mmol/L   Potassium 3.5 3.5 - 5.1 mmol/L   Chloride 119 (H) 98 - 111 mmol/L   CO2 28 22 - 32 mmol/L   Glucose, Bld 198 (H) 70 - 99 mg/dL   BUN 45 (H) 6 - 20 mg/dL   Creatinine, Ser <4.12 (L) 0.61 - 1.24 mg/dL   Calcium 87.8 8.9 - 67.6 mg/dL   GFR calc non Af Amer NOT CALCULATED >60 mL/min   GFR calc Af Amer NOT CALCULATED >60 mL/min   Anion gap 4 (L) 5 - 15    Comment: Performed at [facility], [address], [place], [place] [address]  Glucose, capillary     Status: Abnormal   Collection Time: 09/18/18  8:07 AM  Result Value Ref Range   Glucose-Capillary 182 (H) 70 - 99 mg/dL   Comment 1 Notify RN    Comment 2 Document in  Chart   Glucose, capillary     Status: Abnormal   Collection Time: 09/18/18 11:26 AM  Result Value Ref Range   Glucose-Capillary 169 (H) 70 - 99 mg/dL   Comment 1 Notify RN    Comment 2 Document in Chart   Glucose, capillary     Status: Abnormal   Collection Time: 09/18/18  3:26 PM  Result Value Ref Range   Glucose-Capillary 157 (H) 70 - 99 mg/dL   Comment 1 Notify RN    Comment 2 Document in Chart   Glucose, capillary     Status: Abnormal   Collection Time: 09/18/18  7:39 PM  Result Value Ref Range   Glucose-Capillary 158 (H) 70 - 99 mg/dL  Glucose, capillary     Status: Abnormal   Collection Time: 09/18/18 11:28 PM  Result Value Ref Range   Glucose-Capillary 199 (H) 70 - 99 mg/dL  Glucose, capillary     Status: Abnormal   Collection Time: 09/19/18  3:41 AM  Result Value Ref Range   Glucose-Capillary 140 (H) 70 - 99 mg/dL  Glucose, capillary     Status: Abnormal   Collection Time: 09/19/18  7:38 AM  Result Value Ref Range   Glucose-Capillary 109 (H) 70 - 99 mg/dL   Comment 1 Notify RN    Comment 2 Document in Chart   Glucose, capillary     Status: Abnormal   Collection Time: 09/19/18 11:27 AM  Result Value Ref Range   Glucose-Capillary 115 (H) 70 - 99 mg/dL   Comment 1 Notify RN    Comment 2 Document in Chart    No results found.  Review of Systems  Constitutional: Positive for weight loss.  HENT: Negative.   Eyes: Negative.   Cardiovascular: Positive for leg swelling.  Gastrointestinal: Positive for vomiting.  Musculoskeletal: Negative.   Skin: Negative.   Neurological: Negative.   Psychiatric/Behavioral: Negative.     Blood pressure 118/79, pulse (!) 109, temperature 98.1 F (36.7 C), temperature source Axillary, resp. rate 18, height 6'  2.02" (1.88 m), weight 65.5 kg, SpO2 98 %. Physical Exam  Constitutional: He appears well-developed.  HENT:  Head: Normocephalic and atraumatic.  Respiratory: Effort normal.  GI: Soft. He exhibits no distension.   Musculoskeletal:        General: Tenderness and edema present.  Neurological: He is alert.  Psychiatric:  Janina Mayo in place     Assessment/Plan Left leg and gluteal wounds.  Attempt skin graft to one site and further Acell to other.  Alena Bills Sanaa Zilberman, DO 09/19/2018, 12:23 PM

## 2018-09-19 NOTE — Transfer of Care (Signed)
Immediate Anesthesia Transfer of Care Note  Patient: Christopher [name]  Procedure(s) Performed: Debridement of gluteal wound with placement of acell to left leg. (Left Leg Upper) Skin Graft Split Thickness (Right Thigh)  Patient Location: ICU  Anesthesia Type:General  Level of Consciousness: sedated  Airway & Oxygen Therapy: Patient connected to tracheostomy mask oxygen and Patient remains intubated per anesthesia plan  Post-op Assessment: Report given to RN and Post -op Vital signs reviewed and stable  Post vital signs: Reviewed and stable  Last Vitals:  Vitals Value Taken Time  BP    Temp    Pulse    Resp    SpO2      Last Pain:  Vitals:   09/19/18 1202  TempSrc: Axillary  PainSc:          Complications: No apparent anesthesia complications

## 2018-09-19 NOTE — Progress Notes (Signed)
OT Cancellation Note  Patient Details Name: Christopher [name] MRN: [id number] DOB: Apr 05, 1966   Cancelled Treatment:    Reason Eval/Treat Not Completed: Patient at procedure or test/ unavailable(Pt at sx for grafting. RN stating hold till tomorrow. WIll return as schedule allows. Thank you.)  Kiran Lapine M Dorotea Hand Benard Minturn MSOT, OTR/L Acute Rehab Pager: [phone] Office: [phone] 09/19/2018, 12:57 PM

## 2018-09-20 ENCOUNTER — Encounter (HOSPITAL_COMMUNITY): Payer: Self-pay | Admitting: Plastic Surgery

## 2018-09-20 LAB — BASIC METABOLIC PANEL
Anion gap: 8 (ref 5–15)
BUN: 45 mg/dL — ABNORMAL HIGH (ref 6–20)
CO2: 26 mmol/L (ref 22–32)
Calcium: 9.8 mg/dL (ref 8.9–10.3)
Chloride: 118 mmol/L — ABNORMAL HIGH (ref 98–111)
Creatinine, Ser: 0.42 mg/dL — ABNORMAL LOW (ref 0.61–1.24)
GFR calc Af Amer: >60 mL/min (ref >60)
GFR calc non Af Amer: >60 mL/min (ref >60)
Glucose, Bld: 268 mg/dL — ABNORMAL HIGH (ref 70–99)
Potassium: 3.8 mmol/L (ref 3.5–5.1)
Sodium: 152 mmol/L — ABNORMAL HIGH (ref 135–145)

## 2018-09-20 LAB — CBC
HCT: 29.5 % — ABNORMAL LOW (ref 39.0–52.0)
Hemoglobin: 8.2 g/dL — ABNORMAL LOW (ref 13.0–17.0)
MCH: 29.8 pg (ref 26.0–34.0)
MCHC: 27.8 g/dL — ABNORMAL LOW (ref 30.0–36.0)
MCV: 107.3 fL — ABNORMAL HIGH (ref 80.0–100.0)
Platelets: 407 10*3/uL — ABNORMAL HIGH (ref 150–400)
RBC: 2.75 MIL/uL — ABNORMAL LOW (ref 4.22–5.81)
RDW: 17.3 % — ABNORMAL HIGH (ref 11.5–15.5)
WBC: 7.6 10*3/uL (ref 4.0–10.5)
nRBC: 0 % (ref 0.0–0.2)

## 2018-09-20 LAB — GLUCOSE, CAPILLARY
Glucose-Capillary: 209 mg/dL — ABNORMAL HIGH (ref 70–99)
Glucose-Capillary: 170 mg/dL — ABNORMAL HIGH (ref 70–99)
Glucose-Capillary: 148 mg/dL — ABNORMAL HIGH (ref 70–99)
Glucose-Capillary: 164 mg/dL — ABNORMAL HIGH (ref 70–99)
Glucose-Capillary: 172 mg/dL — ABNORMAL HIGH (ref 70–99)
Glucose-Capillary: 160 mg/dL — ABNORMAL HIGH (ref 70–99)
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
## 2018-09-20 NOTE — Anesthesia Postprocedure Evaluation (Signed)
Anesthesia Post Note  Patient: Christopher [name]  Procedure(s) Performed: Debridement of gluteal wound with placement of acell to left leg. (Left Leg Upper) Skin Graft Split Thickness (Right Thigh)     Patient location during evaluation: SICU Anesthesia Type: General Level of consciousness: sedated Pain management: pain level controlled Vital Signs Assessment: post-procedure vital signs reviewed and stable Respiratory status: patient on ventilator - see flowsheet for VS (via tracheostomy) Cardiovascular status: stable Postop Assessment: no apparent nausea or vomiting Anesthetic complications: no    Last Vitals:  Vitals:   09/20/18 1700 09/20/18 1800  BP: 121/78 113/67  Pulse: (!) 122 (!) 119  Resp: 20 20  Temp:    SpO2: 97% 98%    Last Pain:  Vitals:   09/20/18 1600  TempSrc: Axillary  PainSc:                  Catheryn Bacon Ellender
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
## 2018-09-20 NOTE — Progress Notes (Signed)
Pt. Had poor UO during the day and none during night shift.  Bladder scan showed in bladder.  Called urology and received a verbal order to flush the suprapubic catheter with 30-14ml.  Completed orders and pt. Simultaneously put out through suprapubic catheter.
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
## 2018-09-21 ENCOUNTER — Encounter (HOSPITAL_COMMUNITY): Payer: Self-pay | Admitting: Plastic Surgery

## 2018-09-21 LAB — GLUCOSE, CAPILLARY
Glucose-Capillary: 144 mg/dL — ABNORMAL HIGH (ref 70–99)
Glucose-Capillary: 142 mg/dL — ABNORMAL HIGH (ref 70–99)
Glucose-Capillary: 142 mg/dL — ABNORMAL HIGH (ref 70–99)
Glucose-Capillary: 203 mg/dL — ABNORMAL HIGH (ref 70–99)
Glucose-Capillary: 185 mg/dL — ABNORMAL HIGH (ref 70–99)
Glucose-Capillary: 174 mg/dL — ABNORMAL HIGH (ref 70–99)

## 2018-09-21 LAB — MRSA PCR SCREENING: MRSA by PCR: NEGATIVE

## 2018-09-21 NOTE — Progress Notes (Addendum)
Video call completed with Christopher [name] and family. Pt not responsive, eyes open but rolled back. I Reported very tired from day, weaned earlier and late physical therapy. Family visibly upset and emotional
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
## 2018-09-21 NOTE — Progress Notes (Signed)
Flushed suprapubic catheter x 2 (1100 and again at 1430) because of extremely low UO.  The 2nd flush would barely push.  Appears to be pulled out further than initial inspection.  Catheter not sutured in place.  Performed bladder scan with results of .   Continued to troubleshoot with no success so paged Urologist, Dr Berneice Heinrich x 2 at @ 1515 with no response initially and Dr Cliffton Asters with Trauma Service at 1543 who said to wait to hear back from urologist.  Dr Berneice Heinrich returned page at [phone] and said to get Urology cart from OR to bedside and he would be up in a few hours.   Will continue to monitor patient. Jodeen Mclin C
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
## 2018-09-22 LAB — CBC WITH DIFFERENTIAL/PLATELET
Abs Immature Granulocytes: 0.05 10*3/uL (ref 0.00–0.07)
Basophils Absolute: 0 10*3/uL (ref 0.0–0.1)
Basophils Relative: 0 %
Eosinophils Absolute: 0.8 10*3/uL — ABNORMAL HIGH (ref 0.0–0.5)
Eosinophils Relative: 8 %
HCT: 28.1 % — ABNORMAL LOW (ref 39.0–52.0)
Hemoglobin: 8 g/dL — ABNORMAL LOW (ref 13.0–17.0)
Immature Granulocytes: 1 %
Lymphocytes Relative: 12 %
Lymphs Abs: 1.1 10*3/uL (ref 0.7–4.0)
MCH: 30.9 pg (ref 26.0–34.0)
MCHC: 28.5 g/dL — ABNORMAL LOW (ref 30.0–36.0)
MCV: 108.5 fL — ABNORMAL HIGH (ref 80.0–100.0)
Monocytes Absolute: 0.7 10*3/uL (ref 0.1–1.0)
Monocytes Relative: 7 %
Neutro Abs: 6.7 10*3/uL (ref 1.7–7.7)
Neutrophils Relative %: 72 %
Platelets: 412 10*3/uL — ABNORMAL HIGH (ref 150–400)
RBC: 2.59 MIL/uL — ABNORMAL LOW (ref 4.22–5.81)
RDW: 17.2 % — ABNORMAL HIGH (ref 11.5–15.5)
WBC: 9.2 10*3/uL (ref 4.0–10.5)
nRBC: 0 % (ref 0.0–0.2)

## 2018-09-22 LAB — BASIC METABOLIC PANEL
Anion gap: 7 (ref 5–15)
BUN: 48 mg/dL — ABNORMAL HIGH (ref 6–20)
CO2: 28 mmol/L (ref 22–32)
Calcium: 10.4 mg/dL — ABNORMAL HIGH (ref 8.9–10.3)
Chloride: 116 mmol/L — ABNORMAL HIGH (ref 98–111)
Creatinine, Ser: 0.44 mg/dL — ABNORMAL LOW (ref 0.61–1.24)
GFR calc Af Amer: >60 mL/min (ref >60)
GFR calc non Af Amer: >60 mL/min (ref >60)
Glucose, Bld: 151 mg/dL — ABNORMAL HIGH (ref 70–99)
Potassium: 3.7 mmol/L (ref 3.5–5.1)
Sodium: 151 mmol/L — ABNORMAL HIGH (ref 135–145)

## 2018-09-22 LAB — GLUCOSE, CAPILLARY
Glucose-Capillary: 140 mg/dL — ABNORMAL HIGH (ref 70–99)
Glucose-Capillary: 118 mg/dL — ABNORMAL HIGH (ref 70–99)
Glucose-Capillary: 136 mg/dL — ABNORMAL HIGH (ref 70–99)
Glucose-Capillary: 149 mg/dL — ABNORMAL HIGH (ref 70–99)
Glucose-Capillary: 173 mg/dL — ABNORMAL HIGH (ref 70–99)
Glucose-Capillary: 180 mg/dL — ABNORMAL HIGH (ref 70–99)
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
## 2018-09-23 LAB — GLUCOSE, CAPILLARY
Glucose-Capillary: 142 mg/dL — ABNORMAL HIGH (ref 70–99)
Glucose-Capillary: 170 mg/dL — ABNORMAL HIGH (ref 70–99)
Glucose-Capillary: 167 mg/dL — ABNORMAL HIGH (ref 70–99)
Glucose-Capillary: 185 mg/dL — ABNORMAL HIGH (ref 70–99)
Glucose-Capillary: 150 mg/dL — ABNORMAL HIGH (ref 70–99)
Glucose-Capillary: 184 mg/dL — ABNORMAL HIGH (ref 70–99)
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
## 2018-09-24 LAB — GLUCOSE, CAPILLARY
Glucose-Capillary: 149 mg/dL — ABNORMAL HIGH (ref 70–99)
Glucose-Capillary: 158 mg/dL — ABNORMAL HIGH (ref 70–99)
Glucose-Capillary: 184 mg/dL — ABNORMAL HIGH (ref 70–99)
Glucose-Capillary: 193 mg/dL — ABNORMAL HIGH (ref 70–99)
Glucose-Capillary: 193 mg/dL — ABNORMAL HIGH (ref 70–99)
Glucose-Capillary: 227 mg/dL — ABNORMAL HIGH (ref 70–99)

## 2018-09-24 LAB — CBC WITH DIFFERENTIAL/PLATELET
Abs Immature Granulocytes: 0.06 10*3/uL (ref 0.00–0.07)
Basophils Absolute: 0 10*3/uL (ref 0.0–0.1)
Basophils Relative: 0 %
Eosinophils Absolute: 0.9 10*3/uL — ABNORMAL HIGH (ref 0.0–0.5)
Eosinophils Relative: 11 %
HCT: 28.5 % — ABNORMAL LOW (ref 39.0–52.0)
Hemoglobin: 7.7 g/dL — ABNORMAL LOW (ref 13.0–17.0)
Immature Granulocytes: 1 %
Lymphocytes Relative: 11 %
Lymphs Abs: 1 10*3/uL (ref 0.7–4.0)
MCH: 29.8 pg (ref 26.0–34.0)
MCHC: 27 g/dL — ABNORMAL LOW (ref 30.0–36.0)
MCV: 110.5 fL — ABNORMAL HIGH (ref 80.0–100.0)
Monocytes Absolute: 0.6 10*3/uL (ref 0.1–1.0)
Monocytes Relative: 7 %
Neutro Abs: 6.1 10*3/uL (ref 1.7–7.7)
Neutrophils Relative %: 70 %
Platelets: 389 10*3/uL (ref 150–400)
RBC: 2.58 MIL/uL — ABNORMAL LOW (ref 4.22–5.81)
RDW: 17.9 % — ABNORMAL HIGH (ref 11.5–15.5)
WBC: 8.6 10*3/uL (ref 4.0–10.5)
nRBC: 0 % (ref 0.0–0.2)

## 2018-09-24 LAB — BASIC METABOLIC PANEL
Anion gap: 8 (ref 5–15)
BUN: 34 mg/dL — ABNORMAL HIGH (ref 6–20)
CO2: 26 mmol/L (ref 22–32)
Calcium: 10.4 mg/dL — ABNORMAL HIGH (ref 8.9–10.3)
Chloride: 120 mmol/L — ABNORMAL HIGH (ref 98–111)
Creatinine, Ser: 0.39 mg/dL — ABNORMAL LOW (ref 0.61–1.24)
GFR calc Af Amer: >60 mL/min (ref >60)
GFR calc non Af Amer: >60 mL/min (ref >60)
Glucose, Bld: 180 mg/dL — ABNORMAL HIGH (ref 70–99)
Potassium: 3.4 mmol/L — ABNORMAL LOW (ref 3.5–5.1)
Sodium: 154 mmol/L — ABNORMAL HIGH (ref 135–145)

## 2018-09-24 MED ORDER — IBUPROFEN 100 MG/5ML PO SUSP
600.0000 mg | Freq: Once | ORAL | Status: AC
  Administered 2018-09-24: 600 mg via ORAL
  Filled 2018-09-24: qty 30

## 2018-09-24 MED ORDER — POTASSIUM CHLORIDE 20 MEQ/15ML (10%) PO SOLN
40.0000 meq | Freq: Three times a day (TID) | ORAL | Status: DC
  Administered 2018-09-24 – 2018-10-16 (×65): 40 meq via ORAL
  Filled 2018-09-24 (×64): qty 30
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
## 2018-09-25 ENCOUNTER — Inpatient Hospital Stay (HOSPITAL_COMMUNITY): Payer: No Typology Code available for payment source

## 2018-09-25 LAB — BLOOD CULTURE ID PANEL (REFLEXED)

## 2018-09-25 LAB — GLUCOSE, CAPILLARY
Glucose-Capillary: 147 mg/dL — ABNORMAL HIGH (ref 70–99)
Glucose-Capillary: 151 mg/dL — ABNORMAL HIGH (ref 70–99)
Glucose-Capillary: 163 mg/dL — ABNORMAL HIGH (ref 70–99)
Glucose-Capillary: 166 mg/dL — ABNORMAL HIGH (ref 70–99)
Glucose-Capillary: 166 mg/dL — ABNORMAL HIGH (ref 70–99)
Glucose-Capillary: 180 mg/dL — ABNORMAL HIGH (ref 70–99)

## 2018-09-25 LAB — BASIC METABOLIC PANEL
Anion gap: 5 (ref 5–15)
BUN: 40 mg/dL — ABNORMAL HIGH (ref 6–20)
CO2: 26 mmol/L (ref 22–32)
Calcium: 10.1 mg/dL (ref 8.9–10.3)
Chloride: 123 mmol/L — ABNORMAL HIGH (ref 98–111)
Creatinine, Ser: 0.38 mg/dL — ABNORMAL LOW (ref 0.61–1.24)
GFR calc Af Amer: >60 mL/min (ref >60)
GFR calc non Af Amer: >60 mL/min (ref >60)
Glucose, Bld: 211 mg/dL — ABNORMAL HIGH (ref 70–99)
Potassium: 3.9 mmol/L (ref 3.5–5.1)
Sodium: 154 mmol/L — ABNORMAL HIGH (ref 135–145)

## 2018-09-25 MED ORDER — IBUPROFEN 100 MG/5ML PO SUSP: 400.0000 mg | Freq: Three times a day (TID) | ORAL | Status: DC | PRN

## 2018-09-25 MED ORDER — IBUPROFEN 100 MG/5ML PO SUSP
400.0000 mg | Freq: Three times a day (TID) | ORAL | Status: DC | PRN
  Administered 2018-09-25 – 2018-10-15 (×11): 400 mg
  Filled 2018-09-25 (×11): qty 20

## 2018-09-25 MED ORDER — SODIUM CHLORIDE 0.9 % IV SOLN
2.0000 g | Freq: Three times a day (TID) | INTRAVENOUS | Status: DC
  Administered 2018-09-25 – 2018-09-27 (×6): 2 g via INTRAVENOUS
  Filled 2018-09-25 (×10): qty 2

## 2018-09-25 MED ORDER — VANCOMYCIN HCL 10 G IV SOLR
1250.0000 mg | Freq: Two times a day (BID) | INTRAVENOUS | Status: DC
  Administered 2018-09-25 – 2018-09-26 (×3): 1250 mg via INTRAVENOUS
  Filled 2018-09-25 (×6): qty 1250
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
## 2018-09-26 LAB — CBC
HCT: 27.6 % — ABNORMAL LOW (ref 39.0–52.0)
Hemoglobin: 7.6 g/dL — ABNORMAL LOW (ref 13.0–17.0)
MCH: 30.3 pg (ref 26.0–34.0)
MCHC: 27.5 g/dL — ABNORMAL LOW (ref 30.0–36.0)
MCV: 110 fL — ABNORMAL HIGH (ref 80.0–100.0)
Platelets: 374 10*3/uL (ref 150–400)
RBC: 2.51 MIL/uL — ABNORMAL LOW (ref 4.22–5.81)
RDW: 18.4 % — ABNORMAL HIGH (ref 11.5–15.5)
WBC: 8.4 10*3/uL (ref 4.0–10.5)
nRBC: 0 % (ref 0.0–0.2)

## 2018-09-26 LAB — URINE CULTURE: Culture: >=100000 — AB

## 2018-09-26 LAB — BASIC METABOLIC PANEL
Anion gap: 7 (ref 5–15)
BUN: 37 mg/dL — ABNORMAL HIGH (ref 6–20)
CO2: 25 mmol/L (ref 22–32)
Calcium: 10.3 mg/dL (ref 8.9–10.3)
Chloride: 119 mmol/L — ABNORMAL HIGH (ref 98–111)
Creatinine, Ser: 0.37 mg/dL — ABNORMAL LOW (ref 0.61–1.24)
GFR calc Af Amer: >60 mL/min (ref >60)
GFR calc non Af Amer: >60 mL/min (ref >60)
Glucose, Bld: 167 mg/dL — ABNORMAL HIGH (ref 70–99)
Potassium: 3.6 mmol/L (ref 3.5–5.1)
Sodium: 151 mmol/L — ABNORMAL HIGH (ref 135–145)

## 2018-09-26 LAB — CULTURE, BLOOD (ROUTINE X 2): Special Requests: ADEQUATE

## 2018-09-26 LAB — GLUCOSE, CAPILLARY
Glucose-Capillary: 115 mg/dL — ABNORMAL HIGH (ref 70–99)
Glucose-Capillary: 139 mg/dL — ABNORMAL HIGH (ref 70–99)
Glucose-Capillary: 147 mg/dL — ABNORMAL HIGH (ref 70–99)
Glucose-Capillary: 155 mg/dL — ABNORMAL HIGH (ref 70–99)
Glucose-Capillary: 178 mg/dL — ABNORMAL HIGH (ref 70–99)
Glucose-Capillary: 189 mg/dL — ABNORMAL HIGH (ref 70–99)

## 2018-09-26 MED ORDER — GABAPENTIN 250 MG/5ML PO SOLN
300.0000 mg | Freq: Three times a day (TID) | ORAL | Status: DC
  Administered 2018-09-26 – 2018-10-16 (×58): 300 mg
  Filled 2018-09-26 (×63): qty 6

## 2018-09-26 NOTE — Progress Notes (Signed)
Subjective: The patient is stable.  Underwent skin graft to left anterior leg.  Objective: Vital signs in last 24 hours: Temp:  [99.3 F (37.4 C)-102.7 F (39.3 C)] 99.3 F (37.4 C) (04/15 1200) Pulse Rate:  [107-129] 122 (04/15 1300) Resp:  [18-29] 25 (04/15 1300) BP: (101-129)/(62-84) 129/84 (04/15 1300) SpO2:  [95 %-100 %] 98 % (04/15 1300) FiO2 (%):  [30 %] 30 % (04/15 1300) Weight:  [65.1 kg] 65.1 kg (04/15 0500) Weight change: -0.1 kg Last BM Date: 09/26/18  Intake/Output from previous day: 04/14 0701 - 04/15 0700 In: 4675 [I.V.:320; GE/XB:2841; IV Piggyback:550] Out: 2425 [Urine:2385; Stool:40] Intake/Output this shift: Total I/O In: 1613 [Other:320; NG/GT:1043; IV Piggyback:250] Out: 275 [Urine:150; Stool:125]  General appearance: alert and cooperative Skin: skin graft looking very clean and healthy  Lab Results: Recent Labs    09/24/18 0613 09/26/18 0500  WBC 8.6 8.4  HGB 7.7* 7.6*  HCT 28.5* 27.6*  PLT 389 374   BMET Recent Labs    09/25/18 0227 09/26/18 0500  NA 154* 151*  K 3.9 3.6  CL 123* 119*  CO2 26 25  GLUCOSE 211* 167*  BUN 40* 37*  CREATININE 0.38* 0.37*  CALCIUM 10.1 10.3    Studies/Results: Dg Chest Port 1 View  Result Date: 09/25/2018 CLINICAL DATA:  Fever EXAM: PORTABLE CHEST 1 VIEW COMPARISON:  09/12/2018 FINDINGS: Tracheostomy tube and left PICC are stable. Patchy airspace opacities in both mid and lower lung zones have improved. No pneumothorax. No pleural effusion. IMPRESSION: Improving bilateral central and basilar patchy airspace opacities. Electronically Signed   By: Jolaine Click M.D.   On: 09/25/2018 08:14    Medications: I have reviewed the patient's current medications.  Assessment/Plan: Will plan to go to OR next week for further skin graft placement.  LOS: 78 days   Peggye Form 09/26/2018, 2:47 PM

## 2018-09-26 NOTE — Progress Notes (Addendum)
Follow up - Trauma and Critical Care  Patient Details:    Christopher Urbas is an 53 y.o. male.  Lines/tubes : PICC Double Lumen 08/10/18 PICC Left Brachial 50 cm 1 cm (Active)  Indication for Insertion or Continuance of Line Chronic illness with exacerbations (CF, Sickle Cell, etc.);Prolonged intravenous therapies 09/25/2018  8:00 PM  Exposed Catheter (cm) 1 cm 08/10/2018  3:39 PM  Site Assessment Clean;Dry;Intact 09/25/2018  8:00 PM  Lumen #1 Status In-line blood sampling system in place;Flushed;Blood return noted 09/25/2018  8:00 PM  Lumen #2 Status Flushed;Saline locked 09/25/2018  8:00 PM  Dressing Type Transparent;Occlusive 09/25/2018  8:00 PM  Dressing Status Clean;Dry;Intact;Antimicrobial disc in place 09/25/2018  8:00 PM  Line Care Connections checked and tightened 09/25/2018  8:00 PM  Line Adjustment (NICU/IV Team Only) No 09/18/2018  8:00 PM  Dressing Intervention New dressing 09/21/2018  1:00 AM  Dressing Change Due 09/28/18 09/25/2018  8:00 PM     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)  Surrounding Skin Dry;Intact 09/25/2018  8:00 PM  Tube Status Patent 09/25/2018  8:00 PM  Drainage Appearance None 09/25/2018  8:00 PM  Dressing Status None 09/25/2018  8:00 PM  Dressing Intervention Dressing changed 09/17/2018  8:00 PM  Dressing Type Split gauze 09/23/2018  8:00 PM  Dressing Change Due 09/16/18 09/15/2018  8:00 PM  G Port Intake (mL) 50 ml 09/26/2018  1:00 AM  Output (mL) 75 mL 09/06/2018  6:00 PM     Colostomy LLQ (Active)  Ostomy Pouch 2 piece 09/25/2018  8:00 PM  Stoma Assessment Pink 09/25/2018  8:00 PM  Peristomal Assessment Intact 09/25/2018  8:00 PM  Treatment Pouch change 09/18/2018 11:00 PM  Output (mL) 40 mL 09/26/2018  5:00 AM     Suprapubic Catheter Non-latex 14 Fr. (Active)  Site Assessment Intact 09/25/2018  8:00 PM  Dressing Status None 09/25/2018  8:00 PM  Dressing Type Tegaderm 09/23/2018  8:00 PM  Collection Container  Standard drainage bag 09/25/2018  8:00 PM  Securement Method Securement Device 09/25/2018  8:00 PM  Indication for Insertion or Continuance of Catheter Bladder outlet obstruction / other urologic reason 09/25/2018  8:00 PM  Output (mL) 200 mL 09/26/2018  6:00 AM    Microbiology/Sepsis markers: Results for orders placed or performed during the hospital encounter of 07/10/18  MRSA PCR Screening     Status: None   Collection Time: 07/11/18  1:47 AM  Result Value Ref Range Status   MRSA by PCR NEGATIVE NEGATIVE Final    Comment:        The GeneXpert MRSA Assay (FDA approved for NASAL specimens only), is one component of a comprehensive MRSA colonization surveillance program. It is not intended to diagnose MRSA infection nor to guide or monitor treatment for MRSA infections. Performed at [facility], [address], [place], [place] [address]   Surgical pcr screen     Status: None   Collection Time: 07/12/18  8:11 AM  Result Value Ref Range Status   MRSA, PCR NEGATIVE NEGATIVE Final   Staphylococcus aureus NEGATIVE NEGATIVE Final    Comment: (NOTE) The Xpert SA Assay (FDA approved for NASAL specimens in patients 51 years of age and older), is one component of a comprehensive surveillance program. It is not intended to diagnose infection nor to guide or monitor treatment. Performed at [facility], [address], [place], [place] [address]   Culture, blood (Routine X 2) w Reflex to ID Panel  Status: None   Collection Time: 07/17/18  8:40 AM  Result Value Ref Range Status   Specimen Description BLOOD RIGHT ARM  Final   Special Requests   Final    BOTTLES DRAWN AEROBIC AND ANAEROBIC Blood Culture adequate volume   Culture   Final    NO GROWTH 5 DAYS Performed at [facility], [address] [place], [place] [address]    Report Status 07/22/2018 FINAL  Final  Culture, blood (Routine X 2) w Reflex to ID Panel     Status: None   Collection Time: 07/17/18   8:52 AM  Result Value Ref Range Status   Specimen Description BLOOD RIGHT HAND  Final   Special Requests   Final    BOTTLES DRAWN AEROBIC ONLY Blood Culture adequate volume   Culture   Final    NO GROWTH 5 DAYS Performed at [facility], [address], [place], [place] [address]    Report Status 07/22/2018 FINAL  Final  Culture, blood (Routine X 2) w Reflex to ID Panel     Status: None   Collection Time: 07/30/18  8:50 AM  Result Value Ref Range Status   Specimen Description BLOOD LEFT HAND  Final   Special Requests   Final    BOTTLES DRAWN AEROBIC ONLY Blood Culture results may not be optimal due to an inadequate volume of blood received in culture bottles Performed at [facility], [address], [place], [place] [address]    Culture NO GROWTH 5 DAYS  Final   Report Status 08/04/2018 FINAL  Final  Culture, blood (Routine X 2) w Reflex to ID Panel     Status: None   Collection Time: 07/30/18  9:56 AM  Result Value Ref Range Status   Specimen Description BLOOD LEFT ARM  Final   Special Requests   Final    BOTTLES DRAWN AEROBIC ONLY Blood Culture results may not be optimal due to an inadequate volume of blood received in culture bottles Performed at [facility], [address] [place], [place] [address]    Culture NO GROWTH 5 DAYS  Final   Report Status 08/04/2018 FINAL  Final  Culture, respiratory (non-expectorated)     Status: None   Collection Time: 07/30/18 11:38 AM  Result Value Ref Range Status   Specimen Description TRACHEAL ASPIRATE  Final   Special Requests Normal  Final   Gram Stain   Final    RARE WBC PRESENT, PREDOMINANTLY PMN RARE GRAM POSITIVE COCCI Performed at [facility], [address] [place], [place] [address]    Culture   Final    MODERATE ACINETOBACTER CALCOACETICUS/BAUMANNII COMPLEX   Report Status 08/01/2018 FINAL  Final   Organism ID, Bacteria ACINETOBACTER CALCOACETICUS/BAUMANNII COMPLEX  Final      Susceptibility    Acinetobacter calcoaceticus/baumannii complex - MIC*    CEFTAZIDIME 8 SENSITIVE Sensitive     CEFTRIAXONE 32 INTERMEDIATE Intermediate     CIPROFLOXACIN <=0.25 SENSITIVE Sensitive     GENTAMICIN <=1 SENSITIVE Sensitive     IMIPENEM <=0.25 SENSITIVE Sensitive     PIP/TAZO <=4 SENSITIVE Sensitive     TRIMETH/SULFA <=20 SENSITIVE Sensitive     CEFEPIME 4 SENSITIVE Sensitive     AMPICILLIN/SULBACTAM <=2 SENSITIVE Sensitive     * MODERATE ACINETOBACTER CALCOACETICUS/BAUMANNII COMPLEX  Culture, respiratory (non-expectorated)     Status: None   Collection Time: 08/13/18  9:22 AM  Result Value Ref Range Status  Specimen Description TRACHEAL ASPIRATE  Final   Special Requests Normal  Final   Gram Stain   Final    FEW WBC PRESENT, PREDOMINANTLY PMN MODERATE GRAM POSITIVE COCCI MODERATE GRAM NEGATIVE RODS    Culture   Final    Consistent with normal respiratory flora. Performed at [facility], [address] [place], [place] [address]    Report Status 08/15/2018 FINAL  Final  Culture, blood (Routine X 2) w Reflex to ID Panel     Status: None   Collection Time: 08/13/18  4:23 PM  Result Value Ref Range Status   Specimen Description BLOOD FOOT  Final   Special Requests   Final    BOTTLES DRAWN AEROBIC ONLY Blood Culture results may not be optimal due to an inadequate volume of blood received in culture bottles   Culture   Final    NO GROWTH 5 DAYS Performed at [facility], [address] [place], [place] [address]    Report Status 08/18/2018 FINAL  Final  Culture, blood (Routine X 2) w Reflex to ID Panel     Status: None   Collection Time: 08/13/18  4:41 PM  Result Value Ref Range Status   Specimen Description BLOOD FOOT  Final   Special Requests   Final    BOTTLES DRAWN AEROBIC ONLY Blood Culture results may not be optimal due to an inadequate volume of blood received in culture bottles   Culture   Final    NO GROWTH 5 DAYS Performed at [name], [address], [place], [place] [address]    Report Status 08/18/2018 FINAL  Final  Surgical pcr screen     Status: None   Collection Time: 08/16/18  4:03 AM  Result Value Ref Range Status   MRSA, PCR NEGATIVE NEGATIVE Final   Staphylococcus aureus NEGATIVE NEGATIVE Final    Comment: (NOTE) The Xpert SA Assay (FDA approved for NASAL specimens in patients 52 years of age and older), is one component of a comprehensive surveillance program. It is not intended to diagnose infection nor to guide or monitor treatment. Performed at [facility], [address], [place], [place] [address]   Culture, blood (routine x 2)     Status: Abnormal   Collection Time: 08/18/18 11:50 AM  Result Value Ref Range Status   Specimen Description BLOOD RIGHT HAND  Final   Special Requests   Final    BOTTLES DRAWN AEROBIC ONLY Blood Culture adequate volume   Culture  Setup Time   Final    GRAM NEGATIVE RODS AEROBIC BOTTLE ONLY CRITICAL RESULT CALLED TO, READ BACK BY AND VERIFIED WITH: V. BRYK,PHARMD [id number] 08/19/2018 T. TYSOR    Culture (A)  Final    SERRATIA MARCESCENS PSEUDOMONAS PUTIDA CRITICAL RESULT CALLED TO, READ BACK BY AND VERIFIED WITH: PHARMD M LORI [id number] AT 757 AM BY CM Performed at [facility], [address] [place], [place] [address]    Report Status 08/22/2018 FINAL  Final   Organism ID, Bacteria SERRATIA MARCESCENS  Final   Organism ID, Bacteria PSEUDOMONAS PUTIDA  Final      Susceptibility   Pseudomonas putida - MIC*    CEFTAZIDIME 16 INTERMEDIATE Intermediate     CIPROFLOXACIN 0.5 SENSITIVE Sensitive     GENTAMICIN <=1 SENSITIVE Sensitive     IMIPENEM 2 SENSITIVE Sensitive     PIP/TAZO >=128 RESISTANT Resistant     CEFEPIME 8 SENSITIVE Sensitive     *  PSEUDOMONAS PUTIDA   Serratia marcescens - MIC*    CEFAZOLIN >=64 RESISTANT Resistant     CEFEPIME <=1 SENSITIVE Sensitive     CEFTAZIDIME <=1 SENSITIVE Sensitive     CEFTRIAXONE <=1 SENSITIVE Sensitive     CIPROFLOXACIN  <=0.25 SENSITIVE Sensitive     GENTAMICIN <=1 SENSITIVE Sensitive     TRIMETH/SULFA <=20 SENSITIVE Sensitive     * SERRATIA MARCESCENS  Blood Culture ID Panel (Reflexed)     Status: Abnormal   Collection Time: 08/18/18 11:50 AM  Result Value Ref Range Status   Enterococcus species NOT DETECTED NOT DETECTED Final   Listeria monocytogenes NOT DETECTED NOT DETECTED Final   Staphylococcus species NOT DETECTED NOT DETECTED Final   Staphylococcus aureus (BCID) NOT DETECTED NOT DETECTED Final   Streptococcus species NOT DETECTED NOT DETECTED Final   Streptococcus agalactiae NOT DETECTED NOT DETECTED Final   Streptococcus pneumoniae NOT DETECTED NOT DETECTED Final   Streptococcus pyogenes NOT DETECTED NOT DETECTED Final   Acinetobacter baumannii NOT DETECTED NOT DETECTED Final   Enterobacteriaceae species DETECTED (A) NOT DETECTED Final    Comment: Enterobacteriaceae represent a large family of gram-negative bacteria, not a single organism. CRITICAL RESULT CALLED TO, READ BACK BY AND VERIFIED WITH: V. Joan MayansBRYK,PHARMD [id number] 08/19/2018 T. TYSOR    Enterobacter cloacae complex NOT DETECTED NOT DETECTED Final   Escherichia coli NOT DETECTED NOT DETECTED Final   Klebsiella oxytoca NOT DETECTED NOT DETECTED Final   Klebsiella pneumoniae NOT DETECTED NOT DETECTED Final   Proteus species NOT DETECTED NOT DETECTED Final   Serratia marcescens DETECTED (A) NOT DETECTED Final    Comment: CRITICAL RESULT CALLED TO, READ BACK BY AND VERIFIED WITH: VJoan Mayans. BRYK,PHARMD [id number] 08/19/2018 T. TYSOR    Carbapenem resistance NOT DETECTED NOT DETECTED Final   Haemophilus influenzae NOT DETECTED NOT DETECTED Final   Neisseria meningitidis NOT DETECTED NOT DETECTED Final   Pseudomonas aeruginosa NOT DETECTED NOT DETECTED Final   Candida albicans NOT DETECTED NOT DETECTED Final   Candida glabrata NOT DETECTED NOT DETECTED Final   Candida krusei NOT DETECTED NOT DETECTED Final   Candida parapsilosis NOT DETECTED NOT DETECTED  Final   Candida tropicalis NOT DETECTED NOT DETECTED Final    Comment: Performed at [facility], [address] [place], [place] [address]  Culture, blood (routine x 2)     Status: None   Collection Time: 08/18/18 11:58 AM  Result Value Ref Range Status   Specimen Description BLOOD RIGHT ANTECUBITAL  Final   Special Requests   Final    BOTTLES DRAWN AEROBIC ONLY Blood Culture adequate volume   Culture   Final    NO GROWTH 5 DAYS Performed at [facility], [address] [place], [place] [address]    Report Status 08/23/2018 FINAL  Final  MRSA PCR Screening     Status: None   Collection Time: 09/21/18  1:30 PM  Result Value Ref Range Status   MRSA by PCR NEGATIVE NEGATIVE Final    Comment:        The GeneXpert MRSA Assay (FDA approved for NASAL specimens only), is one component of a comprehensive MRSA colonization surveillance program. It is not intended to diagnose MRSA infection nor to guide or monitor treatment for MRSA infections. Performed at [facility], [address] [place], [place] [address]   Culture, blood (routine x 2)     Status: None (Preliminary result)   Collection Time: 09/24/18  3:55 PM  Result  Value Ref Range Status   Specimen Description BLOOD RIGHT ANTECUBITAL  Final   Special Requests   Final    BOTTLES DRAWN AEROBIC ONLY Blood Culture adequate volume   Culture  Setup Time   Final    GRAM POSITIVE COCCI IN CLUSTERS AEROBIC BOTTLE ONLY Organism ID to follow CRITICAL RESULT CALLED TO, READ BACK BY AND VERIFIED WITH: Joette Catching [facility] 09/25/18 1751 JDW Performed at [facility], [address], [place], [place] [address]    Culture GRAM POSITIVE COCCI  Final   Report Status PENDING  Incomplete  Blood Culture ID Panel (Reflexed)     Status: Abnormal   Collection Time: 09/24/18  3:55 PM  Result Value Ref Range Status   Enterococcus species NOT DETECTED NOT DETECTED Final   Listeria monocytogenes NOT DETECTED NOT DETECTED  Final   Staphylococcus species DETECTED (A) NOT DETECTED Final    Comment: Methicillin (oxacillin) resistant coagulase negative staphylococcus. Possible blood culture contaminant (unless isolated from more than one blood culture draw or clinical case suggests pathogenicity). No antibiotic treatment is indicated for blood  culture contaminants. CRITICAL RESULT CALLED TO, READ BACK BY AND VERIFIED WITH: J [place] PHARMD 09/25/18 1751 JDW    Staphylococcus aureus (BCID) NOT DETECTED NOT DETECTED Final   Methicillin resistance DETECTED (A) NOT DETECTED Final    Comment: CRITICAL RESULT CALLED TO, READ BACK BY AND VERIFIED WITH: J Vincent PHARMD 09/25/18 1751 JDW    Streptococcus species NOT DETECTED NOT DETECTED Final   Streptococcus agalactiae NOT DETECTED NOT DETECTED Final   Streptococcus pneumoniae NOT DETECTED NOT DETECTED Final   Streptococcus pyogenes NOT DETECTED NOT DETECTED Final   Acinetobacter baumannii NOT DETECTED NOT DETECTED Final   Enterobacteriaceae species NOT DETECTED NOT DETECTED Final   Enterobacter cloacae complex NOT DETECTED NOT DETECTED Final   Escherichia coli NOT DETECTED NOT DETECTED Final   Klebsiella oxytoca NOT DETECTED NOT DETECTED Final   Klebsiella pneumoniae NOT DETECTED NOT DETECTED Final   Proteus species NOT DETECTED NOT DETECTED Final   Serratia marcescens NOT DETECTED NOT DETECTED Final   Haemophilus influenzae NOT DETECTED NOT DETECTED Final   Neisseria meningitidis NOT DETECTED NOT DETECTED Final   Pseudomonas aeruginosa NOT DETECTED NOT DETECTED Final   Candida albicans NOT DETECTED NOT DETECTED Final   Candida glabrata NOT DETECTED NOT DETECTED Final   Candida krusei NOT DETECTED NOT DETECTED Final   Candida parapsilosis NOT DETECTED NOT DETECTED Final   Candida tropicalis NOT DETECTED NOT DETECTED Final    Comment: Performed at [facility], [address], [place], [place] [address]  Culture, blood (routine x 2)     Status: None  (Preliminary result)   Collection Time: 09/24/18  3:59 PM  Result Value Ref Range Status   Specimen Description BLOOD HAND  Final   Special Requests   Final    BOTTLES DRAWN AEROBIC ONLY Blood Culture adequate volume   Culture   Final    NO GROWTH < 24 HOURS Performed at [facility], [address], [place], [place] [address]    Report Status PENDING  Incomplete  Culture, Urine     Status: Abnormal   Collection Time: 09/24/18  4:22 PM  Result Value Ref Range Status   Specimen Description URINE, RANDOM  Final   Special Requests   Final    NONE Performed at [facility], [address], [place], [place] [address]    Culture >=100,000 COLONIES/mL ENTEROCOCCUS FAECALIS (  A)  Final   Report Status 09/26/2018 FINAL  Final   Organism ID, Bacteria ENTEROCOCCUS FAECALIS (A)  Final      Susceptibility   Enterococcus faecalis - MIC*    AMPICILLIN <=2 SENSITIVE Sensitive     LEVOFLOXACIN >=8 RESISTANT Resistant     NITROFURANTOIN <=16 SENSITIVE Sensitive     VANCOMYCIN 1 SENSITIVE Sensitive     * >=100,000 COLONIES/mL ENTEROCOCCUS FAECALIS    Anti-infectives:  Anti-infectives (From admission, onward)   Start     Dose/Rate Route Frequency Ordered Stop   09/25/18 2000  vancomycin (VANCOCIN) 1,250 mg in sodium chloride 0.9 % 250 mL IVPB     1,250 mg 166.7 mL/hr over 90 Minutes Intravenous Every 12 hours 09/25/18 1854     09/25/18 0800  ceFEPIme (MAXIPIME) 2 g in sodium chloride 0.9 % 100 mL IVPB     2 g 200 mL/hr over 30 Minutes Intravenous Every 8 hours 09/25/18 0750     09/19/18 1257  polymyxin B 500,000 Units, bacitracin 50,000 Units in sodium chloride 0.9 % 500 mL irrigation  Status:  Discontinued       As needed 09/19/18 1257 09/19/18 1608   09/19/18 1100  ceFAZolin (ANCEF) IVPB 2g/100 mL premix     2 g 200 mL/hr over 30 Minutes Intravenous To Surgery 09/18/18 2055 09/19/18 1334   08/29/18 1512  polymyxin B 500,000 Units, bacitracin 50,000 Units in sodium chloride 0.9 %  500 mL irrigation  Status:  Discontinued       As needed 08/29/18 1512 08/29/18 1721   08/23/18 1200  meropenem (MERREM) 1 g in sodium chloride 0.9 % 100 mL IVPB     1 g 200 mL/hr over 30 Minutes Intravenous Every 8 hours 08/23/18 1122 09/05/18 1311   08/22/18 1400  ceFEPIme (MAXIPIME) 2 g in sodium chloride 0.9 % 100 mL IVPB  Status:  Discontinued     2 g 200 mL/hr over 30 Minutes Intravenous Every 8 hours 08/22/18 0836 08/22/18 1027   08/22/18 1200  meropenem (MERREM) 2 g in sodium chloride 0.9 % 100 mL IVPB  Status:  Discontinued     2 g 200 mL/hr over 30 Minutes Intravenous Every 8 hours 08/22/18 1027 08/23/18 1122   08/21/18 1400  ceFEPIme (MAXIPIME) 1 g in sodium chloride 0.9 % 100 mL IVPB  Status:  Discontinued     1 g 200 mL/hr over 30 Minutes Intravenous Every 8 hours 08/21/18 1042 08/22/18 0836   08/19/18 1030  cefTRIAXone (ROCEPHIN) 2 g in sodium chloride 0.9 % 100 mL IVPB  Status:  Discontinued     2 g 200 mL/hr over 30 Minutes Intravenous Every 24 hours 08/19/18 1017 08/21/18 1042   08/13/18 1400  piperacillin-tazobactam (ZOSYN) IVPB 3.375 g  Status:  Discontinued     3.375 g 12.5 mL/hr over 240 Minutes Intravenous Every 8 hours 08/13/18 0928 08/16/18 0854   08/13/18 1354  polymyxin B 500,000 Units, bacitracin 50,000 Units in sodium chloride 0.9 % 500 mL irrigation  Status:  Discontinued       As needed 08/13/18 1354 08/13/18 1538   08/13/18 0930  piperacillin-tazobactam (ZOSYN) IVPB 3.375 g     3.375 g 100 mL/hr over 30 Minutes Intravenous  Once 08/13/18 0928 08/13/18 1200   08/06/18 0801  polymyxin B 500,000 Units, bacitracin 50,000 Units in sodium chloride 0.9 % 500 mL irrigation  Status:  Discontinued       As needed 08/06/18 0803 08/06/18 0951   08/06/18  0600  ceFAZolin (ANCEF) IVPB 2g/100 mL premix  Status:  Discontinued     2 g 200 mL/hr over 30 Minutes Intravenous On call to O.R. 08/05/18 2241 08/05/18 2241   08/06/18 0600  ceFAZolin (ANCEF) IVPB 2g/100 mL premix   Status:  Discontinued     2 g 200 mL/hr over 30 Minutes Intravenous To Short Stay 08/05/18 2241 08/06/18 1024   08/01/18 2200  Ampicillin-Sulbactam (UNASYN) 3 g in sodium chloride 0.9 % 100 mL IVPB     3 g 200 mL/hr over 30 Minutes Intravenous Every 6 hours 08/01/18 2146 08/08/18 2235   08/01/18 1630  piperacillin-tazobactam (ZOSYN) IVPB 3.375 g  Status:  Discontinued     3.375 g 12.5 mL/hr over 240 Minutes Intravenous Every 8 hours 08/01/18 1624 08/01/18 2139   07/25/18 1508  polymyxin B 500,000 Units, bacitracin 50,000 Units in sodium chloride 0.9 % 500 mL irrigation  Status:  Discontinued       As needed 07/25/18 1509 07/25/18 1750   07/25/18 1445  piperacillin-tazobactam (ZOSYN) IVPB 3.375 g     3.375 g 100 mL/hr over 30 Minutes Intravenous STAT 07/25/18 1443 07/25/18 1620   07/25/18 0600  ceFAZolin (ANCEF) 3 g in dextrose 5 % 50 mL IVPB  Status:  Discontinued     3 g 100 mL/hr over 30 Minutes Intravenous To ShortStay Surgical 07/24/18 1735 07/25/18 1753   07/18/18 1444  polymyxin B 500,000 Units, bacitracin 50,000 Units in sodium chloride 0.9 % 500 mL irrigation  Status:  Discontinued       As needed 07/18/18 1445 07/18/18 1738   07/17/18 0900  piperacillin-tazobactam (ZOSYN) IVPB 3.375 g  Status:  Discontinued     3.375 g 12.5 mL/hr over 240 Minutes Intravenous Every 8 hours 07/17/18 0810 07/27/18 0806   07/12/18 1430  metronidazole (FLAGYL) IVPB 500 mg  Status:  Discontinued     500 mg 100 mL/hr over 60 Minutes Intravenous To Surgery 07/12/18 1427 07/12/18 1608   07/12/18 1430  cefTRIAXone (ROCEPHIN) 2 g in sodium chloride 0.9 % 100 mL IVPB  Status:  Discontinued     2 g 200 mL/hr over 30 Minutes Intravenous To Surgery 07/12/18 1427 07/12/18 1608   07/12/18 1245  tobramycin (NEBCIN) powder  Status:  Discontinued       As needed 07/12/18 1245 07/12/18 1542   07/12/18 1243  vancomycin (VANCOCIN) powder  Status:  Discontinued       As needed 07/12/18 1244 07/12/18 1542   07/11/18  1330  cefTRIAXone (ROCEPHIN) 2 g in sodium chloride 0.9 % 100 mL IVPB  Status:  Discontinued     2 g 200 mL/hr over 30 Minutes Intravenous Every 24 hours 07/11/18 1259 07/17/18 0810   07/11/18 1300  metroNIDAZOLE (FLAGYL) IVPB 500 mg  Status:  Discontinued     500 mg 100 mL/hr over 60 Minutes Intravenous Every 8 hours 07/11/18 1259 07/17/18 0810   07/11/18 0400  ceFAZolin (ANCEF) IVPB 2g/100 mL premix  Status:  Discontinued     2 g 200 mL/hr over 30 Minutes Intravenous Every 8 hours 07/10/18 2109 07/11/18 1259   07/10/18 1815  ceFAZolin (ANCEF) IVPB 2g/100 mL premix  Status:  Discontinued     2 g 200 mL/hr over 30 Minutes Intravenous  Once 07/10/18 1814 07/10/18 2339      Best Practice/Protocols:  VTE Prophylaxis: Lovenox (prophylaxtic dose) and Mechanical GI Prophylaxis: Proton Pump Inhibitor Intermittent Sedation  Consults: Treatment Team:  Md, Trauma, MD Haddix, Gillie Manners,  MD Peggye Form, DO    Events:  Subjective:    Overnight Issues: Still with fevers  Cultures pending   Objective:  Vital signs for last 24 hours: Temp:  [99.7 F (37.6 C)-102.7 F (39.3 C)] 100.2 F (37.9 C) (04/15 0400) Pulse Rate:  [107-129] 112 (04/15 0700) Resp:  [18-29] 20 (04/15 0700) BP: (101-128)/(62-79) 111/71 (04/15 0700) SpO2:  [95 %-100 %] 100 % (04/15 0700) FiO2 (%):  [30 %] 30 % (04/15 0400) Weight:  [65.1 kg] 65.1 kg (04/15 0500)  Hemodynamic parameters for last 24 hours:    Intake/Output from previous day: 04/14 0701 - 04/15 0700 In: 4675 [I.V.:320; ZO/XW:9604; IV Piggyback:550] Out: 2425 [Urine:2385; Stool:40]  Intake/Output this shift: No intake/output data recorded.  Vent settings for last 24 hours: Vent Mode: PRVC FiO2 (%):  [30 %] 30 % Set Rate:  [18 bmp] 18 bmp Vt Set:  [650 mL] 650 mL PEEP:  [5 cmH20] 5 cmH20 Plateau Pressure:  [17 cmH20-26 cmH20] 26 cmH20  Physical Exam:  General: on vent  Neuro: RASS -1 Resp: rhonchi bilaterally CVS: regular rate  and rhythm, S1, S2 normal, no murmur, click, rub or gallop GI: ostomy intact, pink, wound clean and suprapubic tube in place  Extremities: splints in place   Results for orders placed or performed during the hospital encounter of 07/10/18 (from the past 24 hour(s))  Glucose, capillary     Status: Abnormal   Collection Time: 09/25/18 11:29 AM  Result Value Ref Range   Glucose-Capillary 180 (H) 70 - 99 mg/dL   Comment 1 Notify RN    Comment 2 Document in Chart   Glucose, capillary     Status: Abnormal   Collection Time: 09/25/18  3:31 PM  Result Value Ref Range   Glucose-Capillary 166 (H) 70 - 99 mg/dL   Comment 1 Notify RN    Comment 2 Document in Chart   Glucose, capillary     Status: Abnormal   Collection Time: 09/25/18  7:44 PM  Result Value Ref Range   Glucose-Capillary 166 (H) 70 - 99 mg/dL  Glucose, capillary     Status: Abnormal   Collection Time: 09/25/18 11:30 PM  Result Value Ref Range   Glucose-Capillary 147 (H) 70 - 99 mg/dL  Glucose, capillary     Status: Abnormal   Collection Time: 09/26/18  3:19 AM  Result Value Ref Range   Glucose-Capillary 115 (H) 70 - 99 mg/dL  CBC     Status: Abnormal   Collection Time: 09/26/18  5:00 AM  Result Value Ref Range   WBC 8.4 4.0 - 10.5 K/uL   RBC 2.51 (L) 4.22 - 5.81 MIL/uL   Hemoglobin 7.6 (L) 13.0 - 17.0 g/dL   HCT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

## 2018-09-27 LAB — GLUCOSE, CAPILLARY
Glucose-Capillary: 176 mg/dL — ABNORMAL HIGH (ref 70–99)
Glucose-Capillary: 134 mg/dL — ABNORMAL HIGH (ref 70–99)
Glucose-Capillary: 178 mg/dL — ABNORMAL HIGH (ref 70–99)
Glucose-Capillary: 131 mg/dL — ABNORMAL HIGH (ref 70–99)
Glucose-Capillary: 144 mg/dL — ABNORMAL HIGH (ref 70–99)
Glucose-Capillary: 129 mg/dL — ABNORMAL HIGH (ref 70–99)

## 2018-09-27 MED ORDER — SODIUM CHLORIDE 0.9 % IV SOLN
2.0000 g | Freq: Four times a day (QID) | INTRAVENOUS | Status: DC
  Administered 2018-09-27 – 2018-10-05 (×32): 2 g via INTRAVENOUS
  Filled 2018-09-27 (×39): qty 2000

## 2018-09-27 NOTE — Plan of Care (Signed)
  Problem: Nutrition: Goal: Adequate nutrition will be maintained Outcome: Progressing   
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
## 2018-09-27 NOTE — Progress Notes (Addendum)
Patient ID: Christopher [name], male   DOB: [birth year], 53 y.o.   MRN: [id number] Follow up - Trauma Critical Care  Patient Details:    Christopher [name] is an 53 y.o. male.  Lines/tubes : PICC Double Lumen 08/10/18 PICC Left Brachial 50 cm 1 cm (Active)  Indication for Insertion or Continuance of Line Administration of hyperosmolar/irritating solutions (i.e. TPN, Vancomycin, etc.);Prolonged intravenous therapies 09/26/2018  8:00 PM  Exposed Catheter (cm) 1 cm 08/10/2018  3:39 PM  Site Assessment Clean;Dry;Intact 09/26/2018  8:00 PM  Lumen #1 Status In-line blood sampling system in place 09/26/2018  8:00 PM  Lumen #2 Status Infusing 09/26/2018  8:00 PM  Dressing Type Transparent;Occlusive 09/26/2018  8:00 PM  Dressing Status Clean;Dry;Intact;Antimicrobial disc in place 09/26/2018  8:00 PM  Line Care Connections checked and tightened 09/26/2018  8:00 PM  Line Adjustment (NICU/IV Team Only) No 09/18/2018  8:00 PM  Dressing Intervention New dressing 09/21/2018  1:00 AM  Dressing Change Due 09/28/18 09/26/2018  8:00 PM     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)  Surrounding Skin Dry;Intact 09/26/2018  8:00 PM  Tube Status Patent 09/26/2018  8:00 PM  Drainage Appearance None 09/26/2018  8:00 PM  Dressing Status None 09/26/2018  8:00 PM  Dressing Intervention Dressing changed 09/17/2018  8:00 PM  Dressing Type Split gauze 09/23/2018  8:00 PM  Dressing Change Due 09/16/18 09/15/2018  8:00 PM  G Port Intake (mL) 150 ml 09/26/2018 10:00 PM  Output (mL) 75 mL 09/06/2018  6:00 PM     Colostomy LLQ (Active)  Ostomy Pouch 2 piece 09/26/2018  8:00 PM  Stoma Assessment Pink 09/26/2018  8:00 PM  Peristomal Assessment Intact 09/26/2018  8:00 PM  Treatment Pouch change 09/18/2018 11:00 PM  Output (mL) 100 mL 09/27/2018  6:00 AM     Suprapubic Catheter Non-latex 14 Fr. (Active)  Site Assessment Intact 09/26/2018  8:00 PM  Dressing Status None 09/26/2018  8:00 PM   Dressing Type Tegaderm 09/23/2018  8:00 PM  Collection Container Standard drainage bag 09/26/2018  8:00 PM  Securement Method Securement Device 09/26/2018  8:00 PM  Indication for Insertion or Continuance of Catheter Bladder outlet obstruction / other urologic reason 09/26/2018  8:00 PM  Output (mL) 400 mL 09/27/2018  6:00 AM    Microbiology/Sepsis markers: Results for orders placed or performed during the hospital encounter of 07/10/18  MRSA PCR Screening     Status: None   Collection Time: 07/11/18  1:47 AM  Result Value Ref Range Status   MRSA by PCR NEGATIVE NEGATIVE Final    Comment:        The GeneXpert MRSA Assay (FDA approved for NASAL specimens only), is one component of a comprehensive MRSA colonization surveillance program. It is not intended to diagnose MRSA infection nor to guide or monitor treatment for MRSA infections. Performed at [facility], [address], [place], [place] [address]   Surgical pcr screen     Status: None   Collection Time: 07/12/18  8:11 AM  Result Value Ref Range Status   MRSA, PCR NEGATIVE NEGATIVE Final   Staphylococcus aureus NEGATIVE NEGATIVE Final    Comment: (NOTE) The Xpert SA Assay (FDA approved for NASAL specimens in patients 65 years of age and older), is one component of a comprehensive surveillance program. It is not intended to diagnose infection nor to guide or monitor treatment. Performed at [facility], [address] [place], [place] [address]  Culture, blood (Routine X 2) w Reflex to ID Panel     Status: None   Collection Time: 07/17/18  8:40 AM  Result Value Ref Range Status   Specimen Description BLOOD RIGHT ARM  Final   Special Requests   Final    BOTTLES DRAWN AEROBIC AND ANAEROBIC Blood Culture adequate volume   Culture   Final    NO GROWTH 5 DAYS Performed at [facility], [address], [place], [place] [address]    Report Status 07/22/2018 FINAL  Final  Culture, blood (Routine X 2) w  Reflex to ID Panel     Status: None   Collection Time: 07/17/18  8:52 AM  Result Value Ref Range Status   Specimen Description BLOOD RIGHT HAND  Final   Special Requests   Final    BOTTLES DRAWN AEROBIC ONLY Blood Culture adequate volume   Culture   Final    NO GROWTH 5 DAYS Performed at [facility], [address] [place], [place] [address]    Report Status 07/22/2018 FINAL  Final  Culture, blood (Routine X 2) w Reflex to ID Panel     Status: None   Collection Time: 07/30/18  8:50 AM  Result Value Ref Range Status   Specimen Description BLOOD LEFT HAND  Final   Special Requests   Final    BOTTLES DRAWN AEROBIC ONLY Blood Culture results may not be optimal due to an inadequate volume of blood received in culture bottles Performed at [facility], [address], [place], [place] [address]    Culture NO GROWTH 5 DAYS  Final   Report Status 08/04/2018 FINAL  Final  Culture, blood (Routine X 2) w Reflex to ID Panel     Status: None   Collection Time: 07/30/18  9:56 AM  Result Value Ref Range Status   Specimen Description BLOOD LEFT ARM  Final   Special Requests   Final    BOTTLES DRAWN AEROBIC ONLY Blood Culture results may not be optimal due to an inadequate volume of blood received in culture bottles Performed at [facility], [address], [place], [place] [address]    Culture NO GROWTH 5 DAYS  Final   Report Status 08/04/2018 FINAL  Final  Culture, respiratory (non-expectorated)     Status: None   Collection Time: 07/30/18 11:38 AM  Result Value Ref Range Status   Specimen Description TRACHEAL ASPIRATE  Final   Special Requests Normal  Final   Gram Stain   Final    RARE WBC PRESENT, PREDOMINANTLY PMN RARE GRAM POSITIVE COCCI Performed at [facility], [address], [place], [place] [address]    Culture   Final    MODERATE ACINETOBACTER CALCOACETICUS/BAUMANNII COMPLEX   Report Status 08/01/2018 FINAL  Final   Organism ID, Bacteria  ACINETOBACTER CALCOACETICUS/BAUMANNII COMPLEX  Final      Susceptibility   Acinetobacter calcoaceticus/baumannii complex - MIC*    CEFTAZIDIME 8 SENSITIVE Sensitive     CEFTRIAXONE 32 INTERMEDIATE Intermediate     CIPROFLOXACIN <=0.25 SENSITIVE Sensitive     GENTAMICIN <=1 SENSITIVE Sensitive     IMIPENEM <=0.25 SENSITIVE Sensitive     PIP/TAZO <=4 SENSITIVE Sensitive     TRIMETH/SULFA <=20 SENSITIVE Sensitive     CEFEPIME 4 SENSITIVE Sensitive     AMPICILLIN/SULBACTAM <=2 SENSITIVE Sensitive     * MODERATE ACINETOBACTER CALCOACETICUS/BAUMANNII COMPLEX  Culture, respiratory (non-expectorated)     Status: None  Collection Time: 08/13/18  9:22 AM  Result Value Ref Range Status   Specimen Description TRACHEAL ASPIRATE  Final   Special Requests Normal  Final   Gram Stain   Final    FEW WBC PRESENT, PREDOMINANTLY PMN MODERATE GRAM POSITIVE COCCI MODERATE GRAM NEGATIVE RODS    Culture   Final    Consistent with normal respiratory flora. Performed at [facility], [address], [place], [place] [address]    Report Status 08/15/2018 FINAL  Final  Culture, blood (Routine X 2) w Reflex to ID Panel     Status: None   Collection Time: 08/13/18  4:23 PM  Result Value Ref Range Status   Specimen Description BLOOD FOOT  Final   Special Requests   Final    BOTTLES DRAWN AEROBIC ONLY Blood Culture results may not be optimal due to an inadequate volume of blood received in culture bottles   Culture   Final    NO GROWTH 5 DAYS Performed at [facility], [address], [place], [place] [address]    Report Status 08/18/2018 FINAL  Final  Culture, blood (Routine X 2) w Reflex to ID Panel     Status: None   Collection Time: 08/13/18  4:41 PM  Result Value Ref Range Status   Specimen Description BLOOD FOOT  Final   Special Requests   Final    BOTTLES DRAWN AEROBIC ONLY Blood Culture results may not be optimal due to an inadequate volume of blood received in culture bottles    Culture   Final    NO GROWTH 5 DAYS Performed at [facility], [address] [place], [place] [address]    Report Status 08/18/2018 FINAL  Final  Surgical pcr screen     Status: None   Collection Time: 08/16/18  4:03 AM  Result Value Ref Range Status   MRSA, PCR NEGATIVE NEGATIVE Final   Staphylococcus aureus NEGATIVE NEGATIVE Final    Comment: (NOTE) The Xpert SA Assay (FDA approved for NASAL specimens in patients 82 years of age and older), is one component of a comprehensive surveillance program. It is not intended to diagnose infection nor to guide or monitor treatment. Performed at [facility], [address], [place], [place] [address]   Culture, blood (routine x 2)     Status: Abnormal   Collection Time: 08/18/18 11:50 AM  Result Value Ref Range Status   Specimen Description BLOOD RIGHT HAND  Final   Special Requests   Final    BOTTLES DRAWN AEROBIC ONLY Blood Culture adequate volume   Culture  Setup Time   Final    GRAM NEGATIVE RODS AEROBIC BOTTLE ONLY CRITICAL RESULT CALLED TO, READ BACK BY AND VERIFIED WITH: V. BRYK,PHARMD 1027 08/19/2018 T. TYSOR    Culture (A)  Final    SERRATIA MARCESCENS PSEUDOMONAS PUTIDA CRITICAL RESULT CALLED TO, READ BACK BY AND VERIFIED WITH: PHARMD M LORI [id number] AT 757 AM BY CM Performed at [facility], [address], [place], [place] [address]    Report Status 08/22/2018 FINAL  Final   Organism ID, Bacteria SERRATIA MARCESCENS  Final   Organism ID, Bacteria PSEUDOMONAS PUTIDA  Final      Susceptibility   Pseudomonas putida - MIC*    CEFTAZIDIME 16 INTERMEDIATE Intermediate     CIPROFLOXACIN 0.5 SENSITIVE Sensitive     GENTAMICIN <=1 SENSITIVE Sensitive     IMIPENEM 2 SENSITIVE Sensitive  PIP/TAZO >=128 RESISTANT Resistant     CEFEPIME 8 SENSITIVE Sensitive     * PSEUDOMONAS PUTIDA   Serratia marcescens - MIC*    CEFAZOLIN >=64 RESISTANT Resistant     CEFEPIME <=1 SENSITIVE Sensitive     CEFTAZIDIME <=1  SENSITIVE Sensitive     CEFTRIAXONE <=1 SENSITIVE Sensitive     CIPROFLOXACIN <=0.25 SENSITIVE Sensitive     GENTAMICIN <=1 SENSITIVE Sensitive     TRIMETH/SULFA <=20 SENSITIVE Sensitive     * SERRATIA MARCESCENS  Blood Culture ID Panel (Reflexed)     Status: Abnormal   Collection Time: 08/18/18 11:50 AM  Result Value Ref Range Status   Enterococcus species NOT DETECTED NOT DETECTED Final   Listeria monocytogenes NOT DETECTED NOT DETECTED Final   Staphylococcus species NOT DETECTED NOT DETECTED Final   Staphylococcus aureus (BCID) NOT DETECTED NOT DETECTED Final   Streptococcus species NOT DETECTED NOT DETECTED Final   Streptococcus agalactiae NOT DETECTED NOT DETECTED Final   Streptococcus pneumoniae NOT DETECTED NOT DETECTED Final   Streptococcus pyogenes NOT DETECTED NOT DETECTED Final   Acinetobacter baumannii NOT DETECTED NOT DETECTED Final   Enterobacteriaceae species DETECTED (A) NOT DETECTED Final    Comment: Enterobacteriaceae represent a large family of gram-negative bacteria, not a single organism. CRITICAL RESULT CALLED TO, READ BACK BY AND VERIFIED WITH: V. Joan Mayans 1610 08/19/2018 T. TYSOR    Enterobacter cloacae complex NOT DETECTED NOT DETECTED Final   Escherichia coli NOT DETECTED NOT DETECTED Final   Klebsiella oxytoca NOT DETECTED NOT DETECTED Final   Klebsiella pneumoniae NOT DETECTED NOT DETECTED Final   Proteus species NOT DETECTED NOT DETECTED Final   Serratia marcescens DETECTED (A) NOT DETECTED Final    Comment: CRITICAL RESULT CALLED TO, READ BACK BY AND VERIFIED WITH: VJoan Mayans [id number] 08/19/2018 T. TYSOR    Carbapenem resistance NOT DETECTED NOT DETECTED Final   Haemophilus influenzae NOT DETECTED NOT DETECTED Final   Neisseria meningitidis NOT DETECTED NOT DETECTED Final   Pseudomonas aeruginosa NOT DETECTED NOT DETECTED Final   Candida albicans NOT DETECTED NOT DETECTED Final   Candida glabrata NOT DETECTED NOT DETECTED Final   Candida krusei  NOT DETECTED NOT DETECTED Final   Candida parapsilosis NOT DETECTED NOT DETECTED Final   Candida tropicalis NOT DETECTED NOT DETECTED Final    Comment: Performed at [facility], [address] [place], [place] [address]  Culture, blood (routine x 2)     Status: None   Collection Time: 08/18/18 11:58 AM  Result Value Ref Range Status   Specimen Description BLOOD RIGHT ANTECUBITAL  Final   Special Requests   Final    BOTTLES DRAWN AEROBIC ONLY Blood Culture adequate volume   Culture   Final    NO GROWTH 5 DAYS Performed at [facility], [address] [place], [place] [address]    Report Status 08/23/2018 FINAL  Final  MRSA PCR Screening     Status: None   Collection Time: 09/21/18  1:30 PM  Result Value Ref Range Status   MRSA by PCR NEGATIVE NEGATIVE Final    Comment:        The GeneXpert MRSA Assay (FDA approved for NASAL specimens only), is one component of a comprehensive MRSA colonization surveillance program. It is not intended to diagnose MRSA infection nor to guide or monitor treatment for MRSA infections. Performed at [facility], [address], [place], [place] [address]   Culture, blood (routine x 2)  Status: Abnormal   Collection Time: 09/24/18  3:55 PM  Result Value Ref Range Status   Specimen Description BLOOD RIGHT ANTECUBITAL  Final   Special Requests   Final    BOTTLES DRAWN AEROBIC ONLY Blood Culture adequate volume   Culture  Setup Time   Final    GRAM POSITIVE COCCI IN CLUSTERS AEROBIC BOTTLE ONLY CRITICAL RESULT CALLED TO, READ BACK BY AND VERIFIED WITH: J Inez PHARMD 09/25/18 1751 JDW    Culture (A)  Final    STAPHYLOCOCCUS SPECIES (COAGULASE NEGATIVE) THE SIGNIFICANCE OF ISOLATING THIS ORGANISM FROM A SINGLE SET OF BLOOD CULTURES WHEN MULTIPLE SETS ARE DRAWN IS UNCERTAIN. PLEASE NOTIFY THE MICROBIOLOGY DEPARTMENT WITHIN ONE WEEK IF SPECIATION AND SENSITIVITIES ARE REQUIRED. Performed at [facility], [address], [place], [place] [address]    Report Status 09/26/2018 FINAL  Final  Blood Culture ID Panel (Reflexed)     Status: Abnormal   Collection Time: 09/24/18  3:55 PM  Result Value Ref Range Status   Enterococcus species NOT DETECTED NOT DETECTED Final   Listeria monocytogenes NOT DETECTED NOT DETECTED Final   Staphylococcus species DETECTED (A) NOT DETECTED Final    Comment: Methicillin (oxacillin) resistant coagulase negative staphylococcus. Possible blood culture contaminant (unless isolated from more than one blood culture draw or clinical case suggests pathogenicity). No antibiotic treatment is indicated for blood  culture contaminants. CRITICAL RESULT CALLED TO, READ BACK BY AND VERIFIED WITH: J Vicco PHARMD 09/25/18 1751 JDW    Staphylococcus aureus (BCID) NOT DETECTED NOT DETECTED Final   Methicillin resistance DETECTED (A) NOT DETECTED Final    Comment: CRITICAL RESULT CALLED TO, READ BACK BY AND VERIFIED WITH: J Banner PHARMD 09/25/18 1751 JDW    Streptococcus species NOT DETECTED NOT DETECTED Final   Streptococcus agalactiae NOT DETECTED NOT DETECTED Final   Streptococcus pneumoniae NOT DETECTED NOT DETECTED Final   Streptococcus pyogenes NOT DETECTED NOT DETECTED Final   Acinetobacter baumannii NOT DETECTED NOT DETECTED Final   Enterobacteriaceae species NOT DETECTED NOT DETECTED Final   Enterobacter cloacae complex NOT DETECTED NOT DETECTED Final   Escherichia coli NOT DETECTED NOT DETECTED Final   Klebsiella oxytoca NOT DETECTED NOT DETECTED Final   Klebsiella pneumoniae NOT DETECTED NOT DETECTED Final   Proteus species NOT DETECTED NOT DETECTED Final   Serratia marcescens NOT DETECTED NOT DETECTED Final   Haemophilus influenzae NOT DETECTED NOT DETECTED Final   Neisseria meningitidis NOT DETECTED NOT DETECTED Final   Pseudomonas aeruginosa NOT DETECTED NOT DETECTED Final   Candida albicans NOT DETECTED NOT DETECTED Final   Candida glabrata NOT DETECTED NOT DETECTED Final    Candida krusei NOT DETECTED NOT DETECTED Final   Candida parapsilosis NOT DETECTED NOT DETECTED Final   Candida tropicalis NOT DETECTED NOT DETECTED Final    Comment: Performed at [facility], [address] [place], [place] [address]  Culture, blood (routine x 2)     Status: None (Preliminary result)   Collection Time: 09/24/18  3:59 PM  Result Value Ref Range Status   Specimen Description BLOOD HAND  Final   Special Requests   Final    BOTTLES DRAWN AEROBIC ONLY Blood Culture adequate volume   Culture   Final    NO GROWTH 2 DAYS Performed at [facility], [address] [place], [place] [address]    Report Status PENDING  Incomplete  Culture, Urine     Status: Abnormal   Collection Time: 09/24/18  4:22 PM  Result Value Ref Range Status   Specimen Description URINE, RANDOM  Final   Special Requests   Final    NONE Performed at [facility], [address], [place], [place] [address]    Culture >=100,000 COLONIES/mL ENTEROCOCCUS FAECALIS (A)  Final   Report Status 09/26/2018 FINAL  Final   Organism ID, Bacteria ENTEROCOCCUS FAECALIS (A)  Final      Susceptibility   Enterococcus faecalis - MIC*    AMPICILLIN <=2 SENSITIVE Sensitive     LEVOFLOXACIN >=8 RESISTANT Resistant     NITROFURANTOIN <=16 SENSITIVE Sensitive     VANCOMYCIN 1 SENSITIVE Sensitive     * >=100,000 COLONIES/mL ENTEROCOCCUS FAECALIS    Anti-infectives:  Anti-infectives (From admission, onward)   Start     Dose/Rate Route Frequency Ordered Stop   09/25/18 2000  vancomycin (VANCOCIN) 1,250 mg in sodium chloride 0.9 % 250 mL IVPB     1,250 mg 166.7 mL/hr over 90 Minutes Intravenous Every 12 hours 09/25/18 1854     09/25/18 0800  ceFEPIme (MAXIPIME) 2 g in sodium chloride 0.9 % 100 mL IVPB     2 g 200 mL/hr over 30 Minutes Intravenous Every 8 hours 09/25/18 0750     09/19/18 1257  polymyxin B 500,000 Units, bacitracin 50,000 Units in sodium chloride 0.9 % 500 mL irrigation  Status:   Discontinued       As needed 09/19/18 1257 09/19/18 1608   09/19/18 1100  ceFAZolin (ANCEF) IVPB 2g/100 mL premix     2 g 200 mL/hr over 30 Minutes Intravenous To Surgery 09/18/18 2055 09/19/18 1334   08/29/18 1512  polymyxin B 500,000 Units, bacitracin 50,000 Units in sodium chloride 0.9 % 500 mL irrigation  Status:  Discontinued       As needed 08/29/18 1512 08/29/18 1721   08/23/18 1200  meropenem (MERREM) 1 g in sodium chloride 0.9 % 100 mL IVPB     1 g 200 mL/hr over 30 Minutes Intravenous Every 8 hours 08/23/18 1122 09/05/18 1311   08/22/18 1400  ceFEPIme (MAXIPIME) 2 g in sodium chloride 0.9 % 100 mL IVPB  Status:  Discontinued     2 g 200 mL/hr over 30 Minutes Intravenous Every 8 hours 08/22/18 0836 08/22/18 1027   08/22/18 1200  meropenem (MERREM) 2 g in sodium chloride 0.9 % 100 mL IVPB  Status:  Discontinued     2 g 200 mL/hr over 30 Minutes Intravenous Every 8 hours 08/22/18 1027 08/23/18 1122   08/21/18 1400  ceFEPIme (MAXIPIME) 1 g in sodium chloride 0.9 % 100 mL IVPB  Status:  Discontinued     1 g 200 mL/hr over 30 Minutes Intravenous Every 8 hours 08/21/18 1042 08/22/18 0836   08/19/18 1030  cefTRIAXone (ROCEPHIN) 2 g in sodium chloride 0.9 % 100 mL IVPB  Status:  Discontinued     2 g 200 mL/hr over 30 Minutes Intravenous Every 24 hours 08/19/18 1017 08/21/18 1042   08/13/18 1400  piperacillin-tazobactam (ZOSYN) IVPB 3.375 g  Status:  Discontinued     3.375 g 12.5 mL/hr over 240 Minutes Intravenous Every 8 hours 08/13/18 0928 08/16/18 0854   08/13/18 1354  polymyxin B 500,000 Units, bacitracin 50,000 Units in sodium chloride 0.9 % 500 mL irrigation  Status:  Discontinued       As needed 08/13/18 1354 08/13/18 1538   08/13/18 0930  piperacillin-tazobactam (ZOSYN) IVPB 3.375 g     3.375 g 100 mL/hr over  30 Minutes Intravenous  Once 08/13/18 0928 08/13/18 1200   08/06/18 0801  polymyxin B 500,000 Units, bacitracin 50,000 Units in sodium chloride 0.9 % 500 mL irrigation   Status:  Discontinued       As needed 08/06/18 0803 08/06/18 0951   08/06/18 0600  ceFAZolin (ANCEF) IVPB 2g/100 mL premix  Status:  Discontinued     2 g 200 mL/hr over 30 Minutes Intravenous On call to O.R. 08/05/18 2241 08/05/18 2241   08/06/18 0600  ceFAZolin (ANCEF) IVPB 2g/100 mL premix  Status:  Discontinued     2 g 200 mL/hr over 30 Minutes Intravenous To Short Stay 08/05/18 2241 08/06/18 1024   08/01/18 2200  Ampicillin-Sulbactam (UNASYN) 3 g in sodium chloride 0.9 % 100 mL IVPB     3 g 200 mL/hr over 30 Minutes Intravenous Every 6 hours 08/01/18 2146 08/08/18 2235   08/01/18 1630  piperacillin-tazobactam (ZOSYN) IVPB 3.375 g  Status:  Discontinued     3.375 g 12.5 mL/hr over 240 Minutes Intravenous Every 8 hours 08/01/18 1624 08/01/18 2139   07/25/18 1508  polymyxin B 500,000 Units, bacitracin 50,000 Units in sodium chloride 0.9 % 500 mL irrigation  Status:  Discontinued       As needed 07/25/18 1509 07/25/18 1750   07/25/18 1445  piperacillin-tazobactam (ZOSYN) IVPB 3.375 g     3.375 g 100 mL/hr over 30 Minutes Intravenous STAT 07/25/18 1443 07/25/18 1620   07/25/18 0600  ceFAZolin (ANCEF) 3 g in dextrose 5 % 50 mL IVPB  Status:  Discontinued     3 g 100 mL/hr over 30 Minutes Intravenous To ShortStay Surgical 07/24/18 1735 07/25/18 1753   07/18/18 1444  polymyxin B 500,000 Units, bacitracin 50,000 Units in sodium chloride 0.9 % 500 mL irrigation  Status:  Discontinued       As needed 07/18/18 1445 07/18/18 1738   07/17/18 0900  piperacillin-tazobactam (ZOSYN) IVPB 3.375 g  Status:  Discontinued     3.375 g 12.5 mL/hr over 240 Minutes Intravenous Every 8 hours 07/17/18 0810 07/27/18 0806   07/12/18 1430  metronidazole (FLAGYL) IVPB 500 mg  Status:  Discontinued     500 mg 100 mL/hr over 60 Minutes Intravenous To Surgery 07/12/18 1427 07/12/18 1608   07/12/18 1430  cefTRIAXone (ROCEPHIN) 2 g in sodium chloride 0.9 % 100 mL IVPB  Status:  Discontinued     2 g 200 mL/hr over 30  Minutes Intravenous To Surgery 07/12/18 1427 07/12/18 1608   07/12/18 1245  tobramycin (NEBCIN) powder  Status:  Discontinued       As needed 07/12/18 1245 07/12/18 1542   07/12/18 1243  vancomycin (VANCOCIN) powder  Status:  Discontinued       As needed 07/12/18 1244 07/12/18 1542   07/11/18 1330  cefTRIAXone (ROCEPHIN) 2 g in sodium chloride 0.9 % 100 mL IVPB  Status:  Discontinued     2 g 200 mL/hr over 30 Minutes Intravenous Every 24 hours 07/11/18 1259 07/17/18 0810   07/11/18 1300  metroNIDAZOLE (FLAGYL) IVPB 500 mg  Status:  Discontinued     500 mg 100 mL/hr over 60 Minutes Intravenous Every 8 hours 07/11/18 1259 07/17/18 0810   07/11/18 0400  ceFAZolin (ANCEF) IVPB 2g/100 mL premix  Status:  Discontinued     2 g 200 mL/hr over 30 Minutes Intravenous Every 8 hours 07/10/18 2109 07/11/18 1259   07/10/18 1815  ceFAZolin (ANCEF) IVPB 2g/100 mL premix  Status:  Discontinued  2 g 200 mL/hr over 30 Minutes Intravenous  Once 07/10/18 1814 07/10/18 2339      Best Practice/Protocols:  VTE Prophylaxis: Lovenox (prophylaxtic dose) Intermittent Sedation  Consults: Treatment Team:  Md, Trauma, MD Haddix, Gillie Manners, MD Dillingham, Alena Bills, DO    Studies:    Events:  Subjective:    Overnight Issues:   Objective:  Vital signs for last 24 hours: Temp:  [98.6 F (37 C)-100.5 F (38.1 C)] 100.1 F (37.8 C) (04/16 0430) Pulse Rate:  [101-125] 110 (04/16 0700) Resp:  [19-27] 21 (04/16 0700) BP: (106-129)/(63-84) 117/79 (04/16 0700) SpO2:  [95 %-100 %] 100 % (04/16 0700) FiO2 (%):  [30 %] 30 % (04/16 0600) Weight:  [65.1 kg] 65.1 kg (04/16 0413)  Hemodynamic parameters for last 24 hours:    Intake/Output from previous day: 04/15 0701 - 04/16 0700 In: 5442.1 [NG/GT:4142; IV Piggyback:800.1] Out: 2150 [Urine:1925; Stool:225]  Intake/Output this shift: No intake/output data recorded.  Vent settings for last 24 hours: Vent Mode: PRVC FiO2 (%):  [30 %] 30 % Set Rate:   [18 bmp] 18 bmp Vt Set:  [650 mL] 650 mL PEEP:  [5 cmH20] 5 cmH20 Plateau Pressure:  [18 cmH20-19 cmH20] 19 cmH20  Physical Exam:  General: on vent Neuro: F/C HEENT/Neck: trach-clean, intact Resp: clear to auscultation bilaterally CVS: RRR GI: soft, PEG, stoma, SP tube Extremities: edema 1+  Results for orders placed or performed during the hospital encounter of 07/10/18 (from the past 24 hour(s))  Glucose, capillary     Status: Abnormal   Collection Time: 09/26/18 11:23 AM  Result Value Ref Range   Glucose-Capillary 155 (H) 70 - 99 mg/dL   Comment 1 Notify RN    Comment 2 Document in Chart   Glucose, capillary     Status: Abnormal   Collection Time: 09/26/18  3:14 PM  Result Value Ref Range   Glucose-Capillary 147 (H) 70 - 99 mg/dL  Glucose, capillary     Status: Abnormal   Collection Time: 09/26/18  8:15 PM  Result Value Ref Range   Glucose-Capillary 189 (H) 70 - 99 mg/dL   Comment 1 Notify RN    Comment 2 Document in Chart   Glucose, capillary     Status: Abnormal   Collection Time: 09/26/18 11:52 PM  Result Value Ref Range   Glucose-Capillary 139 (H) 70 - 99 mg/dL   Comment 1 Notify RN    Comment 2 Document in Chart   Glucose, capillary     Status: Abnormal   Collection Time: 09/27/18  4:51 AM  Result Value Ref Range   Glucose-Capillary 176 (H) 70 - 99 mg/dL   Comment 1 Notify RN    Comment 2 Document in Chart

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
## 2018-09-28 ENCOUNTER — Encounter (HOSPITAL_COMMUNITY): Payer: Self-pay | Admitting: Plastic Surgery

## 2018-09-28 LAB — CBC
HCT: 26.5 % — ABNORMAL LOW (ref 39.0–52.0)
Hemoglobin: 7.1 g/dL — ABNORMAL LOW (ref 13.0–17.0)
MCH: 29.7 pg (ref 26.0–34.0)
MCHC: 26.8 g/dL — ABNORMAL LOW (ref 30.0–36.0)
MCV: 110.9 fL — ABNORMAL HIGH (ref 80.0–100.0)
Platelets: 353 10*3/uL (ref 150–400)
RBC: 2.39 MIL/uL — ABNORMAL LOW (ref 4.22–5.81)
RDW: 18.2 % — ABNORMAL HIGH (ref 11.5–15.5)
WBC: 6.9 10*3/uL (ref 4.0–10.5)
nRBC: 0 % (ref 0.0–0.2)

## 2018-09-28 LAB — GLUCOSE, CAPILLARY
Glucose-Capillary: 152 mg/dL — ABNORMAL HIGH (ref 70–99)
Glucose-Capillary: 131 mg/dL — ABNORMAL HIGH (ref 70–99)
Glucose-Capillary: 165 mg/dL — ABNORMAL HIGH (ref 70–99)
Glucose-Capillary: 128 mg/dL — ABNORMAL HIGH (ref 70–99)
Glucose-Capillary: 157 mg/dL — ABNORMAL HIGH (ref 70–99)
Glucose-Capillary: 165 mg/dL — ABNORMAL HIGH (ref 70–99)

## 2018-09-28 LAB — BASIC METABOLIC PANEL
Anion gap: 6 (ref 5–15)
BUN: 33 mg/dL — ABNORMAL HIGH (ref 6–20)
CO2: 26 mmol/L (ref 22–32)
Calcium: 9.6 mg/dL (ref 8.9–10.3)
Chloride: 114 mmol/L — ABNORMAL HIGH (ref 98–111)
Creatinine, Ser: 0.32 mg/dL — ABNORMAL LOW (ref 0.61–1.24)
GFR calc Af Amer: >60 mL/min (ref >60)
GFR calc non Af Amer: >60 mL/min (ref >60)
Glucose, Bld: 145 mg/dL — ABNORMAL HIGH (ref 70–99)
Potassium: 3.6 mmol/L (ref 3.5–5.1)
Sodium: 146 mmol/L — ABNORMAL HIGH (ref 135–145)

## 2018-09-28 MED ORDER — SODIUM CHLORIDE 0.9 % IR SOLN
100.0000 mL | Freq: Two times a day (BID) | Status: DC
  Administered 2018-09-28 – 2018-10-16 (×33): 100 mL

## 2018-09-28 MED ORDER — SODIUM CHLORIDE 0.9% FLUSH: 100.0000 mL | Freq: Two times a day (BID) | INTRAVENOUS | Status: DC

## 2018-09-28 MED ORDER — POLYETHYLENE GLYCOL 3350 17 G PO PACK
17.0000 g | PACK | Freq: Every day | ORAL | Status: DC
  Administered 2018-09-28 – 2018-10-16 (×18): 17 g
  Filled 2018-09-28 (×18): qty 1

## 2018-09-28 NOTE — Progress Notes (Signed)
Urology paged d/t suprapubic no longer draining. Attempted to flush with no success.   75F suprapubic foley replaced with immediate urine output. 1L of urine returned, urine cloudy with sediment. Urine culture sent.   Per urology power flush with 100 mL NS or sterile water Qshift through foley, not blue port. Will continue to monitor.

## 2018-09-28 NOTE — Consult Note (Signed)
Urology Consult  Referring physician: B Thompson Reason for referral: bloElwyn [name] tube  Chief Complaint: blocked s/p tube  History of Present Illness: 1 - Urethral Disruption / Suprapubic Tube Dependant - s/p severe pelvic fracture with urethral injury 07/11/18 and IR SPT placed. Last change 4/1 due to displacement. Called by NSG today for suspect repeat displacement as minimal UOP, and bladder scan >862mL and some tachycardia. No history from patient Stop draining hours ago Changed Dr Berneice Heinrich April 10  History reviewed. No pertinent past medical history. Past Surgical History:  Procedure Laterality Date  . APPLICATION OF A-CELL OF BACK N/A 08/06/2018   Procedure: Application Of A-Cell Of Back;  Surgeon: Peggye Form, DO;  [facility]: [facility] OR;  Service: Plastics;  Laterality: N/A;  . APPLICATION OF A-CELL OF EXTREMITY Left 08/06/2018   Procedure: Application Of A-Cell Of Extremity;  Surgeon: Peggye Form, DO;  Location: [facility] OR;  Service: Plastics;  Laterality: Left;  . APPLICATION OF WOUND VAC  07/12/2018   Procedure: Application Of Wound Vac to the Left Thigh and Scrotum.;  Surgeon: Roby Lofts, MD;  Location: [facility] OR;  Service: Orthopedics;;  . APPLICATION OF WOUND VAC  07/10/2018   Procedure: Application Of Wound Vac;  Surgeon: Berna Bue, MD;  Location: [facility] OR;  Service: General;;  . COLOSTOMY N/A 07/23/2018   Procedure: COLOSTOMY;  Surgeon: Violeta Gelinas, MD;  Location: [facility] OR;  Service: General;  Laterality: N/A;  . CYSTOSCOPY W/ URETERAL STENT PLACEMENT N/A 07/15/2018   Procedure: RETROGRADE URETHROGRAM;  Surgeon: Marcine Matar, MD;  Location: [facility] OR;  Service: Urology;  Laterality: N/A;  . ESOPHAGOGASTRODUODENOSCOPY N/A 08/14/2018   Procedure: ESOPHAGOGASTRODUODENOSCOPY (EGD);  Surgeon: Violeta Gelinas, MD;  Location: [facility] ENDOSCOPY;  Service: General;  Laterality: N/A;  bedside  . HIP PINNING,CANNULATED Left 07/12/2018   Procedure: CANNULATED HIP PINNING;  Surgeon:  Roby Lofts, MD;  Location: [facility];  Service: Orthopedics;  Laterality: Left;  . I&D EXTREMITY Left 07/25/2018   Procedure: Debridement of buttock, scrotum and left leg, placement of acell and vac;  Surgeon: Peggye Form, DO;  [facility];  Service: Plastics;  Laterality: Left;  . I&D EXTREMITY N/A 08/06/2018   Procedure: Debridement of buttock, scrotum and left leg;  Surgeon: Peggye Form, DO;  [facility];  Service: Plastics;  Laterality: N/A;  . I&D EXTREMITY N/A 08/13/2018   Procedure: Debridement of buttock, scrotum and left leg, placement of acell and vac;  Surgeon: Peggye Form, DO;  [facility];  Service: Plastics;  Laterality: N/A;  90 min, please  . INCISION AND DRAINAGE OF WOUND N/A 07/18/2018   Procedure: Debridement of left leg, buttocks and scrotal wound with placement of acell and Flexiseal;  Surgeon: Peggye Form, DO;  [facility];  Service: Plastics;  Laterality: N/A;  . INCISION AND DRAINAGE OF WOUND Left 08/29/2018   Procedure: Debridement of buttock, scrotum and left leg, placement of acell and vac;  Surgeon: Peggye Form, DO;  [facility];  Service: Plastics;  Laterality: Left;  75 min, please  . IR ANGIOGRAM PELVIS SELECTIVE OR SUPRASELECTIVE  07/10/2018  . IR ANGIOGRAM PELVIS SELECTIVE OR SUPRASELECTIVE  07/10/2018  . IR ANGIOGRAM SELECTIVE EACH ADDITIONAL VESSEL  07/10/2018  . IR EMBO ART  VEN HEMORR LYMPH EXTRAV  INC GUIDE ROADMAPPING  07/10/2018  . IR US GUIDE BX ASP/DRAIN  07/10/2018  . IR US GUIDE VASC ACCESS RIGHT  07/10/2018  . IR VENO/EXT/UNI LEFT  07/10/2018  . IRRIGATION AND DEBRIDEMENT OF WOUND WITH SPLIT THICKNESS SKIN GRAFT Left 09/19/2018   Procedure: Debridement of gluteal wound with placement of acell to left leg.;  Surgeon: Peggye Form, DO;  [facility];  Service: Plastics;  Laterality: Left;  2.5 hours, please  . LAPAROTOMY N/A 07/12/2018   Procedure: EXPLORATORY LAPAROTOMY;  Surgeon: Violeta Gelinas, MD;  Location: [facility] OR;  Service: General;  Laterality: N/A;  . LAPAROTOMY N/A 07/15/2018   Procedure: WOUND EXPLORATION; CLOSURE OF ABDOMEN;  Surgeon: Violeta Gelinas, MD;  Location: [facility] OR;  Service: General;  Laterality: N/A;  . LAPAROTOMY  07/10/2018   Procedure: Exploratory Laparotomy;  Surgeon: Berna Bue, MD;  Location: [facility] OR;  Service: General;;  . PEG PLACEMENT N/A 08/14/2018   Procedure: PERCUTANEOUS ENDOSCOPIC GASTROSTOMY (PEG) PLACEMENT;  Surgeon: Violeta Gelinas, MD;  Location: [facility] ENDOSCOPY;  Service: General;  Laterality: N/A;  . PERCUTANEOUS TRACHEOSTOMY N/A 08/02/2018   Procedure: PERCUTANEOUS TRACHEOSTOMY;  Surgeon: Violeta Gelinas, MD;  Location: [facility] OR;  Service: General;  Laterality: N/A;  . RADIOLOGY WITH ANESTHESIA N/A 07/10/2018   Procedure: IR WITH ANESTHESIA;  Surgeon: Simonne Come, MD;  Location: [facility] OR;  Service: Radiology;  Laterality: N/A;  . RADIOLOGY WITH ANESTHESIA Right 07/10/2018   Procedure: Ir With Anesthesia;  Surgeon: Simonne Come, MD;  Location: [facility] OR;  Service: Radiology;  Laterality: Right;  . SCROTAL EXPLORATION N/A 07/15/2018   Procedure: SCROTUM DEBRIDEMENT;  Surgeon: Marcine Matar, MD;  Location: [facility] OR;  Service: Urology;  Laterality: N/A;  . SKIN SPLIT GRAFT Right 09/19/2018   Procedure: Skin Graft Split Thickness;  Surgeon: Peggye Form, DO;  Location[facility] OR;  Service: Plastics;  Laterality: Right;  Marland Kitchen VACUUM ASSISTED CLOSURE CHANGE N/A 07/12/2018   Procedure: ABDOMINAL VACUUM ASSISTED CLOSURE CHANGE and abdominal washout;  Surgeon: Violeta Gelinas, MD;  Location: [facility] OR;  Service: General;  Laterality: N/A;  . WOUND DEBRIDEMENT Left 07/23/2018   Procedure: DEBRIDEMENT LEFT BUTTOCK  WOUND;  Surgeon: Violeta Gelinas, MD;  Location: [facility] OR;  Service: General;  Laterality: Left;  . WOUND EXPLORATION Left 07/10/2018   Procedure: WOUND EXPLORATION LEFT GROIN;  Surgeon: Berna Bue, MD;  Location: [facility] OR;  Service: General;  Laterality: Left;     Medications: I have reviewed the patient's current medications. Allergies:  Allergies  Allergen Reactions  . Oxycodone     vomiting    History reviewed. No pertinent family history. Social History:  reports that he has been smoking cigarettes. He has a 20.00 pack-year smoking history. He has never used smokeless tobacco. He reports that he does not drink alcohol or use drugs.  ROS: All systems are reviewed and negative except as noted. rst negative  Physical Exam:  Vital signs in last 24 hours: Temp:  [98.6 F (37 C)-99.7 F (37.6 C)] 99.7 F (37.6 C) (04/17 1142) Pulse Rate:  [109-128] 127 (04/17 1500) Resp:  [16-36] 26 (04/17 1500) BP: (103-148)/(63-93) 148/93 (04/17 1500) SpO2:  [96 %-100 %] 96 % (04/17 1522) FiO2 (%):  [30 %] 30 % (04/17 1522)  Cardiovascular: Skin warm; not flushed Respiratory: Breaths quiet; no shortness of breath Abdomen: No masses Neurological: Normal sensation to touch Musculoskeletal: Normal motor function arms and legs Lymphatics: No inguinal adenopathy Skin: No rashes Genitourinary: s/p blocked; I liter scanned  Laboratory Data:  Results for orders placed or performed during the hospital encounter of 07/10/18 (from the past 72 hour(s))  Glucose, capillary     Status: Abnormal  Collection Time: 09/25/18  7:44 PM  Result Value Ref Range   Glucose-Capillary 166 (H) 70 - 99 mg/dL  Glucose, capillary     Status: Abnormal   Collection Time: 09/25/18 11:30 PM  Result Value Ref Range   Glucose-Capillary 147 (H) 70 - 99 mg/dL  Glucose, capillary     Status: Abnormal   Collection Time: 09/26/18  3:19 AM  Result Value Ref Range   Glucose-Capillary 115 (H) 70 - 99 mg/dL  CBC     Status: Abnormal   Collection Time: 09/26/18  5:00 AM  Result Value Ref Range   WBC 8.4 4.0 - 10.5 K/uL   RBC 2.51 (L) 4.22 - 5.81 MIL/uL   Hemoglobin 7.6 (L) 13.0 - 17.0 g/dL   HCT 57.8 (L) 46.9 - 62.9 %   MCV 110.0 (H) 80.0 - 100.0 fL   MCH 30.3 26.0 - 34.0 pg    MCHC 27.5 (L) 30.0 - 36.0 g/dL   RDW 52.8 (H) 41.3 - 24.4 %   Platelets 374 150 - 400 K/uL   nRBC 0.0 0.0 - 0.2 %    Comment: Performed at [facility], [address], [place], [place] [address]  Basic metabolic panel     Status: Abnormal   Collection Time: 09/26/18  5:00 AM  Result Value Ref Range   Sodium 151 (H) 135 - 145 mmol/L   Potassium 3.6 3.5 - 5.1 mmol/L   Chloride 119 (H) 98 - 111 mmol/L   CO2 25 22 - 32 mmol/L   Glucose, Bld 167 (H) 70 - 99 mg/dL   BUN 37 (H) 6 - 20 mg/dL   Creatinine, Ser 2.53 (L) 0.61 - 1.24 mg/dL   Calcium 66.4 8.9 - 40.3 mg/dL   GFR calc non Af Amer >60 >60 mL/min   GFR calc Af Amer >60 >60 mL/min   Anion gap 7 5 - 15    Comment: Performed at [facility], [address], [place], [place] [address]  Glucose, capillary     Status: Abnormal   Collection Time: 09/26/18  7:41 AM  Result Value Ref Range   Glucose-Capillary 178 (H) 70 - 99 mg/dL   Comment 1 Notify RN    Comment 2 Document in Chart   Glucose, capillary     Status: Abnormal   Collection Time: 09/26/18 11:23 AM  Result Value Ref Range   Glucose-Capillary 155 (H) 70 - 99 mg/dL   Comment 1 Notify RN    Comment 2 Document in Chart   Glucose, capillary     Status: Abnormal   Collection Time: 09/26/18  3:14 PM  Result Value Ref Range   Glucose-Capillary 147 (H) 70 - 99 mg/dL  Glucose, capillary     Status: Abnormal   Collection Time: 09/26/18  8:15 PM  Result Value Ref Range   Glucose-Capillary 189 (H) 70 - 99 mg/dL   Comment 1 Notify RN    Comment 2 Document in Chart   Glucose, capillary     Status: Abnormal   Collection Time: 09/26/18 11:52 PM  Result Value Ref Range   Glucose-Capillary 139 (H) 70 - 99 mg/dL   Comment 1 Notify RN    Comment 2 Document in Chart   Glucose, capillary     Status: Abnormal   Collection Time: 09/27/18  4:51 AM  Result Value Ref Range   Glucose-Capillary 176 (H) 70 - 99 mg/dL   Comment 1 Notify RN    Comment 2 Document  in Chart    Glucose, capillary     Status: Abnormal   Collection Time: 09/27/18  7:34 AM  Result Value Ref Range   Glucose-Capillary 134 (H) 70 - 99 mg/dL  Glucose, capillary     Status: Abnormal   Collection Time: 09/27/18 11:39 AM  Result Value Ref Range   Glucose-Capillary 178 (H) 70 - 99 mg/dL  Glucose, capillary     Status: Abnormal   Collection Time: 09/27/18  4:12 PM  Result Value Ref Range   Glucose-Capillary 131 (H) 70 - 99 mg/dL  Glucose, capillary     Status: Abnormal   Collection Time: 09/27/18  7:39 PM  Result Value Ref Range   Glucose-Capillary 144 (H) 70 - 99 mg/dL  Glucose, capillary     Status: Abnormal   Collection Time: 09/27/18 11:12 PM  Result Value Ref Range   Glucose-Capillary 129 (H) 70 - 99 mg/dL  Glucose, capillary     Status: Abnormal   Collection Time: 09/28/18  3:28 AM  Result Value Ref Range   Glucose-Capillary 152 (H) 70 - 99 mg/dL  CBC     Status: Abnormal   Collection Time: 09/28/18  6:50 AM  Result Value Ref Range   WBC 6.9 4.0 - 10.5 K/uL   RBC 2.39 (L) 4.22 - 5.81 MIL/uL   Hemoglobin 7.1 (L) 13.0 - 17.0 g/dL   HCT 56.226.5 (L) 13.039.0 - 86.552.0 %   MCV 110.9 (H) 80.0 - 100.0 fL   MCH 29.7 26.0 - 34.0 pg   MCHC 26.8 (L) 30.0 - 36.0 g/dL   RDW 78.418.2 (H) 69.611.5 - 29.515.5 %   Platelets 353 150 - 400 K/uL   nRBC 0.0 0.0 - 0.2 %    Comment: Performed at [facility], [address] [place], [place] [address]  Basic metabolic panel     Status: Abnormal   Collection Time: 09/28/18  6:50 AM  Result Value Ref Range   Sodium 146 (H) 135 - 145 mmol/L   Potassium 3.6 3.5 - 5.1 mmol/L   Chloride 114 (H) 98 - 111 mmol/L   CO2 26 22 - 32 mmol/L   Glucose, Bld 145 (H) 70 - 99 mg/dL   BUN 33 (H) 6 - 20 mg/dL   Creatinine, Ser 2.440.32 (L) 0.61 - 1.24 mg/dL   Calcium 9.6 8.9 - 01.010.3 mg/dL   GFR calc non Af Amer >60 >60 mL/min   GFR calc Af Amer >60 >60 mL/min   Anion gap 6 5 - 15    Comment: Performed at [facility], [address] [place], [place] [address]   Glucose, capillary     Status: Abnormal   Collection Time: 09/28/18  8:35 AM  Result Value Ref Range   Glucose-Capillary 131 (H) 70 - 99 mg/dL  Glucose, capillary     Status: Abnormal   Collection Time: 09/28/18 12:24 PM  Result Value Ref Range   Glucose-Capillary 165 (H) 70 - 99 mg/dL  Glucose, capillary     Status: Abnormal   Collection Time: 09/28/18  4:10 PM  Result Value Ref Range   Glucose-Capillary 128 (H) 70 - 99 mg/dL   Recent Results (from the past 240 hour(s))  MRSA PCR Screening     Status: None   Collection Time: 09/21/18  1:30 PM  Result Value Ref Range Status   MRSA by PCR NEGATIVE NEGATIVE Final    Comment:        The GeneXpert MRSA Assay (FDA  approved for NASAL specimens only), is one component of a comprehensive MRSA colonization surveillance program. It is not intended to diagnose MRSA infection nor to guide or monitor treatment for MRSA infections. Performed at [facility], [address], [place], [place] [address]   Culture, blood (routine x 2)     Status: Abnormal   Collection Time: 09/24/18  3:55 PM  Result Value Ref Range Status   Specimen Description BLOOD RIGHT ANTECUBITAL  Final   Special Requests   Final    BOTTLES DRAWN AEROBIC ONLY Blood Culture adequate volume   Culture  Setup Time   Final    GRAM POSITIVE COCCI IN CLUSTERS AEROBIC BOTTLE ONLY CRITICAL RESULT CALLED TO, READ BACK BY AND VERIFIED WITH: J [place] PHARMD 09/25/18 1751 JDW    Culture (A)  Final    STAPHYLOCOCCUS SPECIES (COAGULASE NEGATIVE) THE SIGNIFICANCE OF ISOLATING THIS ORGANISM FROM A SINGLE SET OF BLOOD CULTURES WHEN MULTIPLE SETS ARE DRAWN IS UNCERTAIN. PLEASE NOTIFY THE MICROBIOLOGY DEPARTMENT WITHIN ONE WEEK IF SPECIATION AND SENSITIVITIES ARE REQUIRED. Performed at [facility], [address] [place], [place] [address]    Report Status 09/26/2018 FINAL  Final  Blood Culture ID Panel (Reflexed)     Status: Abnormal   Collection Time: 09/24/18  3:55  PM  Result Value Ref Range Status   Enterococcus species NOT DETECTED NOT DETECTED Final   Listeria monocytogenes NOT DETECTED NOT DETECTED Final   Staphylococcus species DETECTED (A) NOT DETECTED Final    Comment: Methicillin (oxacillin) resistant coagulase negative staphylococcus. Possible blood culture contaminant (unless isolated from more than one blood culture draw or clinical case suggests pathogenicity). No antibiotic treatment is indicated for blood  culture contaminants. CRITICAL RESULT CALLED TO, READ BACK BY AND VERIFIED WITH: J [place] PHARMD 09/25/18 1751 JDW    Staphylococcus aureus (BCID) NOT DETECTED NOT DETECTED Final   Methicillin resistance DETECTED (A) NOT DETECTED Final    Comment: CRITICAL RESULT CALLED TO, READ BACK BY AND VERIFIED WITH: J [place] PHARMD 09/25/18 1751 JDW    Streptococcus species NOT DETECTED NOT DETECTED Final   Streptococcus agalactiae NOT DETECTED NOT DETECTED Final   Streptococcus pneumoniae NOT DETECTED NOT DETECTED Final   Streptococcus pyogenes NOT DETECTED NOT DETECTED Final   Acinetobacter baumannii NOT DETECTED NOT DETECTED Final   Enterobacteriaceae species NOT DETECTED NOT DETECTED Final   Enterobacter cloacae complex NOT DETECTED NOT DETECTED Final   Escherichia coli NOT DETECTED NOT DETECTED Final   Klebsiella oxytoca NOT DETECTED NOT DETECTED Final   Klebsiella pneumoniae NOT DETECTED NOT DETECTED Final   Proteus species NOT DETECTED NOT DETECTED Final   Serratia marcescens NOT DETECTED NOT DETECTED Final   Haemophilus influenzae NOT DETECTED NOT DETECTED Final   Neisseria meningitidis NOT DETECTED NOT DETECTED Final   Pseudomonas aeruginosa NOT DETECTED NOT DETECTED Final   Candida albicans NOT DETECTED NOT DETECTED Final   Candida glabrata NOT DETECTED NOT DETECTED Final   Candida krusei NOT DETECTED NOT DETECTED Final   Candida parapsilosis NOT DETECTED NOT DETECTED Final   Candida tropicalis NOT DETECTED NOT DETECTED Final     Comment: Performed at [facility], [address], [place], [place] [address]  Culture, blood (routine x 2)     Status: None (Preliminary result)   Collection Time: 09/24/18  3:59 PM  Result Value Ref Range Status   Specimen Description BLOOD HAND  Final   Special Requests   Final    BOTTLES  DRAWN AEROBIC ONLY Blood Culture adequate volume   Culture   Final    NO GROWTH 4 DAYS Performed at [facility], [address] [place], [place] [address]    Report Status PENDING  Incomplete  Culture, Urine     Status: Abnormal   Collection Time: 09/24/18  4:22 PM  Result Value Ref Range Status   Specimen Description URINE, RANDOM  Final   Special Requests   Final    NONE Performed at [facility], [address] [place], [place] [address]    Culture >=100,000 COLONIES/mL ENTEROCOCCUS FAECALIS (A)  Final   Report Status 09/26/2018 FINAL  Final   Organism ID, Bacteria ENTEROCOCCUS FAECALIS (A)  Final      Susceptibility   Enterococcus faecalis - MIC*    AMPICILLIN <=2 SENSITIVE Sensitive     LEVOFLOXACIN >=8 RESISTANT Resistant     NITROFURANTOIN <=16 SENSITIVE Sensitive     VANCOMYCIN 1 SENSITIVE Sensitive     * >=100,000 COLONIES/mL ENTEROCOCCUS FAECALIS   Creatinine: Recent Labs    09/22/18 0406 09/24/18 0613 09/25/18 0227 09/26/18 0500 09/28/18 0650  CREATININE 0.44* 0.39* 0.38* 0.37* 0.32*    Xrays: See report/chart   Impression/Assessment:  Blocked 16 Fr council- wire would not engae  Plan:  Easily changed 16 council  Irrigate until clear Irrigate 100-200 ml daily and prn sned urine for c/s  Aleyda Gindlesperger A Allyana Vogan 09/28/2018, 6:15 PM

## 2018-09-28 NOTE — Procedures (Signed)
Tracheostomy Change Note  Patient Details:   Name: Christopher [name] DOB: 06-Mar-1966 MRN: [id number]    Airway Documentation:     Evaluation  O2 sats: stable throughout Complications: No apparent complications Patient did tolerate procedure well. Bilateral Breath Sounds: Rhonchi     Patients trach changed to #6 shiley cuffed. Trauma MD and RN at bedside. Good color change on capnography. Patient tolerating well at this time. RT will continue to monitor.   Rance Muir 09/28/2018, 8:42 AM

## 2018-09-28 NOTE — Progress Notes (Signed)
Patient ID: Christopher [name], male   DOB: 1965/12/13, 53 y.o.   MRN: [id number] Follow up - Trauma Critical Care  Patient Details:    Christopher [name] is an 53 y.o. male.  Lines/tubes : PICC Double Lumen 08/10/18 PICC Left Brachial 50 cm 1 cm (Active)  Indication for Insertion or Continuance of Line Limited venous access - need for IV therapy >5 days (PICC only) 09/27/2018  8:00 PM  Exposed Catheter (cm) 1 cm 08/10/2018  3:39 PM  Site Assessment Clean;Dry;Intact 09/27/2018  8:00 PM  Lumen #1 Status In-line blood sampling system in place 09/27/2018  8:00 PM  Lumen #2 Status Infusing 09/27/2018  8:00 PM  Dressing Type Transparent;Occlusive 09/27/2018  8:00 PM  Dressing Status Clean;Dry;Intact;Antimicrobial disc in place 09/27/2018  8:00 PM  Line Care Connections checked and tightened 09/27/2018  8:00 PM  Line Adjustment (NICU/IV Team Only) No 09/18/2018  8:00 PM  Dressing Intervention New dressing 09/21/2018  1:00 AM  Dressing Change Due 09/28/18 09/27/2018  8:00 PM     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)  Surrounding Skin Dry;Intact 09/27/2018  8:00 PM  Tube Status Patent 09/27/2018  8:00 PM  Drainage Appearance None 09/27/2018  8:00 PM  Dressing Status None 09/27/2018  8:00 PM  Dressing Intervention Dressing changed 09/27/2018  8:00 PM  Dressing Type Split gauze 09/27/2018  8:00 PM  Dressing Change Due 09/16/18 09/27/2018  8:00 PM  G Port Intake (mL) 150 ml 09/26/2018 10:00 PM  Output (mL) 75 mL 09/06/2018  6:00 PM     Colostomy LLQ (Active)  Ostomy Pouch 2 piece;Intact 09/27/2018  8:00 PM  Stoma Assessment Pink 09/27/2018  8:00 PM  Peristomal Assessment Intact 09/27/2018  8:00 PM  Treatment Pouch change 09/18/2018 11:00 PM  Output (mL) 200 mL 09/28/2018  6:00 AM     Suprapubic Catheter Non-latex 14 Fr. (Active)  Site Assessment Intact 09/27/2018  8:00 PM  Dressing Status None 09/27/2018  8:00 PM  Dressing Type Split gauze 09/27/2018  8:00 PM   Collection Container Standard drainage bag 09/27/2018  8:00 PM  Securement Method Securement Device;Taped 09/27/2018  8:00 PM  Indication for Insertion or Continuance of Catheter Bladder outlet obstruction / other urologic reason 09/27/2018  8:00 PM  Output (mL) 250 mL 09/28/2018  6:00 AM    Microbiology/Sepsis markers: Results for orders placed or performed during the hospital encounter of 07/10/18  MRSA PCR Screening     Status: None   Collection Time: 07/11/18  1:47 AM  Result Value Ref Range Status   MRSA by PCR NEGATIVE NEGATIVE Final    Comment:        The GeneXpert MRSA Assay (FDA approved for NASAL specimens only), is one component of a comprehensive MRSA colonization surveillance program. It is not intended to diagnose MRSA infection nor to guide or monitor treatment for MRSA infections. Performed at [facility], [address], [place], [place] [address]   Surgical pcr screen     Status: None   Collection Time: 07/12/18  8:11 AM  Result Value Ref Range Status   MRSA, PCR NEGATIVE NEGATIVE Final   Staphylococcus aureus NEGATIVE NEGATIVE Final    Comment: (NOTE) The Xpert SA Assay (FDA approved for NASAL specimens in patients 11 years of age and older), is one component of a comprehensive surveillance program. It is not intended to diagnose infection nor to guide or monitor treatment. Performed at [facility], [address] [place],  [place] [address]   Culture, blood (Routine X 2) w Reflex to ID Panel     Status: None   Collection Time: 07/17/18  8:40 AM  Result Value Ref Range Status   Specimen Description BLOOD RIGHT ARM  Final   Special Requests   Final    BOTTLES DRAWN AEROBIC AND ANAEROBIC Blood Culture adequate volume   Culture   Final    NO GROWTH 5 DAYS Performed at [facility], [address] [place], [place] [address]    Report Status 07/22/2018 FINAL  Final  Culture, blood (Routine X 2) w Reflex to ID Panel     Status: None    Collection Time: 07/17/18  8:52 AM  Result Value Ref Range Status   Specimen Description BLOOD RIGHT HAND  Final   Special Requests   Final    BOTTLES DRAWN AEROBIC ONLY Blood Culture adequate volume   Culture   Final    NO GROWTH 5 DAYS Performed at [facility], [address], [place], [place] [address]    Report Status 07/22/2018 FINAL  Final  Culture, blood (Routine X 2) w Reflex to ID Panel     Status: None   Collection Time: 07/30/18  8:50 AM  Result Value Ref Range Status   Specimen Description BLOOD LEFT HAND  Final   Special Requests   Final    BOTTLES DRAWN AEROBIC ONLY Blood Culture results may not be optimal due to an inadequate volume of blood received in culture bottles Performed at [facility], [address], [place], [place] [address]    Culture NO GROWTH 5 DAYS  Final   Report Status 08/04/2018 FINAL  Final  Culture, blood (Routine X 2) w Reflex to ID Panel     Status: None   Collection Time: 07/30/18  9:56 AM  Result Value Ref Range Status   Specimen Description BLOOD LEFT ARM  Final   Special Requests   Final    BOTTLES DRAWN AEROBIC ONLY Blood Culture results may not be optimal due to an inadequate volume of blood received in culture bottles Performed at [facility], [address] [place], [place] [address]    Culture NO GROWTH 5 DAYS  Final   Report Status 08/04/2018 FINAL  Final  Culture, respiratory (non-expectorated)     Status: None   Collection Time: 07/30/18 11:38 AM  Result Value Ref Range Status   Specimen Description TRACHEAL ASPIRATE  Final   Special Requests Normal  Final   Gram Stain   Final    RARE WBC PRESENT, PREDOMINANTLY PMN RARE GRAM POSITIVE COCCI Performed at [facility], [address] [place], [place] [address]    Culture   Final    MODERATE ACINETOBACTER CALCOACETICUS/BAUMANNII COMPLEX   Report Status 08/01/2018 FINAL  Final   Organism ID, Bacteria ACINETOBACTER CALCOACETICUS/BAUMANNII COMPLEX   Final      Susceptibility   Acinetobacter calcoaceticus/baumannii complex - MIC*    CEFTAZIDIME 8 SENSITIVE Sensitive     CEFTRIAXONE 32 INTERMEDIATE Intermediate     CIPROFLOXACIN <=0.25 SENSITIVE Sensitive     GENTAMICIN <=1 SENSITIVE Sensitive     IMIPENEM <=0.25 SENSITIVE Sensitive     PIP/TAZO <=4 SENSITIVE Sensitive     TRIMETH/SULFA <=20 SENSITIVE Sensitive     CEFEPIME 4 SENSITIVE Sensitive     AMPICILLIN/SULBACTAM <=2 SENSITIVE Sensitive     * MODERATE ACINETOBACTER CALCOACETICUS/BAUMANNII COMPLEX  Culture, respiratory (non-expectorated)  Status: None   Collection Time: 08/13/18  9:22 AM  Result Value Ref Range Status   Specimen Description TRACHEAL ASPIRATE  Final   Special Requests Normal  Final   Gram Stain   Final    FEW WBC PRESENT, PREDOMINANTLY PMN MODERATE GRAM POSITIVE COCCI MODERATE GRAM NEGATIVE RODS    Culture   Final    Consistent with normal respiratory flora. Performed at [facility], [address], [place], [place] [address]    Report Status 08/15/2018 FINAL  Final  Culture, blood (Routine X 2) w Reflex to ID Panel     Status: None   Collection Time: 08/13/18  4:23 PM  Result Value Ref Range Status   Specimen Description BLOOD FOOT  Final   Special Requests   Final    BOTTLES DRAWN AEROBIC ONLY Blood Culture results may not be optimal due to an inadequate volume of blood received in culture bottles   Culture   Final    NO GROWTH 5 DAYS Performed at [facility], [address] [place], [place] [address]    Report Status 08/18/2018 FINAL  Final  Culture, blood (Routine X 2) w Reflex to ID Panel     Status: None   Collection Time: 08/13/18  4:41 PM  Result Value Ref Range Status   Specimen Description BLOOD FOOT  Final   Special Requests   Final    BOTTLES DRAWN AEROBIC ONLY Blood Culture results may not be optimal due to an inadequate volume of blood received in culture bottles   Culture   Final    NO GROWTH 5 DAYS Performed at  [facility], [address], [place], [place] [address]    Report Status 08/18/2018 FINAL  Final  Surgical pcr screen     Status: None   Collection Time: 08/16/18  4:03 AM  Result Value Ref Range Status   MRSA, PCR NEGATIVE NEGATIVE Final   Staphylococcus aureus NEGATIVE NEGATIVE Final    Comment: (NOTE) The Xpert SA Assay (FDA approved for NASAL specimens in patients 45 years of age and older), is one component of a comprehensive surveillance program. It is not intended to diagnose infection nor to guide or monitor treatment. Performed at [facility], [address] [place], [place] [address]   Culture, blood (routine x 2)     Status: Abnormal   Collection Time: 08/18/18 11:50 AM  Result Value Ref Range Status   Specimen Description BLOOD RIGHT HAND  Final   Special Requests   Final    BOTTLES DRAWN AEROBIC ONLY Blood Culture adequate volume   Culture  Setup Time   Final    GRAM NEGATIVE RODS AEROBIC BOTTLE ONLY CRITICAL RESULT CALLED TO, READ BACK BY AND VERIFIED WITH: V. BRYK,PHARMD [id number] 08/19/2018 T. TYSOR    Culture (A)  Final    SERRATIA MARCESCENS PSEUDOMONAS PUTIDA CRITICAL RESULT CALLED TO, READ BACK BY AND VERIFIED WITH: PHARMD M LORI [id number] AT 757 AM BY CM Performed at [facility], [address] [place], [place] [address]    Report Status 08/22/2018 FINAL  Final   Organism ID, Bacteria SERRATIA MARCESCENS  Final   Organism ID, Bacteria PSEUDOMONAS PUTIDA  Final      Susceptibility   Pseudomonas putida - MIC*    CEFTAZIDIME 16 INTERMEDIATE Intermediate     CIPROFLOXACIN 0.5 SENSITIVE Sensitive     GENTAMICIN <=1 SENSITIVE Sensitive     IMIPENEM 2 SENSITIVE  Sensitive     PIP/TAZO >=128 RESISTANT Resistant     CEFEPIME 8 SENSITIVE Sensitive     * PSEUDOMONAS PUTIDA   Serratia marcescens - MIC*    CEFAZOLIN >=64 RESISTANT Resistant     CEFEPIME <=1 SENSITIVE Sensitive     CEFTAZIDIME <=1 SENSITIVE Sensitive     CEFTRIAXONE <=1 SENSITIVE  Sensitive     CIPROFLOXACIN <=0.25 SENSITIVE Sensitive     GENTAMICIN <=1 SENSITIVE Sensitive     TRIMETH/SULFA <=20 SENSITIVE Sensitive     * SERRATIA MARCESCENS  Blood Culture ID Panel (Reflexed)     Status: Abnormal   Collection Time: 08/18/18 11:50 AM  Result Value Ref Range Status   Enterococcus species NOT DETECTED NOT DETECTED Final   Listeria monocytogenes NOT DETECTED NOT DETECTED Final   Staphylococcus species NOT DETECTED NOT DETECTED Final   Staphylococcus aureus (BCID) NOT DETECTED NOT DETECTED Final   Streptococcus species NOT DETECTED NOT DETECTED Final   Streptococcus agalactiae NOT DETECTED NOT DETECTED Final   Streptococcus pneumoniae NOT DETECTED NOT DETECTED Final   Streptococcus pyogenes NOT DETECTED NOT DETECTED Final   Acinetobacter baumannii NOT DETECTED NOT DETECTED Final   Enterobacteriaceae species DETECTED (A) NOT DETECTED Final    Comment: Enterobacteriaceae represent a large family of gram-negative bacteria, not a single organism. CRITICAL RESULT CALLED TO, READ BACK BY AND VERIFIED WITH: V. Joan Mayans 1610 08/19/2018 T. TYSOR    Enterobacter cloacae complex NOT DETECTED NOT DETECTED Final   Escherichia coli NOT DETECTED NOT DETECTED Final   Klebsiella oxytoca NOT DETECTED NOT DETECTED Final   Klebsiella pneumoniae NOT DETECTED NOT DETECTED Final   Proteus species NOT DETECTED NOT DETECTED Final   Serratia marcescens DETECTED (A) NOT DETECTED Final    Comment: CRITICAL RESULT CALLED TO, READ BACK BY AND VERIFIED WITH: VJoan Mayans [id number] 08/19/2018 T. TYSOR    Carbapenem resistance NOT DETECTED NOT DETECTED Final   Haemophilus influenzae NOT DETECTED NOT DETECTED Final   Neisseria meningitidis NOT DETECTED NOT DETECTED Final   Pseudomonas aeruginosa NOT DETECTED NOT DETECTED Final   Candida albicans NOT DETECTED NOT DETECTED Final   Candida glabrata NOT DETECTED NOT DETECTED Final   Candida krusei NOT DETECTED NOT DETECTED Final   Candida  parapsilosis NOT DETECTED NOT DETECTED Final   Candida tropicalis NOT DETECTED NOT DETECTED Final    Comment: Performed at [facility], [address], [place], [place] [address]  Culture, blood (routine x 2)     Status: None   Collection Time: 08/18/18 11:58 AM  Result Value Ref Range Status   Specimen Description BLOOD RIGHT ANTECUBITAL  Final   Special Requests   Final    BOTTLES DRAWN AEROBIC ONLY Blood Culture adequate volume   Culture   Final    NO GROWTH 5 DAYS Performed at [facility], [address], [place], [place] [address]    Report Status 08/23/2018 FINAL  Final  MRSA PCR Screening     Status: None   Collection Time: 09/21/18  1:30 PM  Result Value Ref Range Status   MRSA by PCR NEGATIVE NEGATIVE Final    Comment:        The GeneXpert MRSA Assay (FDA approved for NASAL specimens only), is one component of a comprehensive MRSA colonization surveillance program. It is not intended to diagnose MRSA infection nor to guide or monitor treatment for MRSA infections. Performed at [facility], [address], [place], [place] [address]   Culture,  blood (routine x 2)     Status: Abnormal   Collection Time: 09/24/18  3:55 PM  Result Value Ref Range Status   Specimen Description BLOOD RIGHT ANTECUBITAL  Final   Special Requests   Final    BOTTLES DRAWN AEROBIC ONLY Blood Culture adequate volume   Culture  Setup Time   Final    GRAM POSITIVE COCCI IN CLUSTERS AEROBIC BOTTLE ONLY CRITICAL RESULT CALLED TO, READ BACK BY AND VERIFIED WITH: J [place] PHARMD 09/25/18 1751 JDW    Culture (A)  Final    STAPHYLOCOCCUS SPECIES (COAGULASE NEGATIVE) THE SIGNIFICANCE OF ISOLATING THIS ORGANISM FROM A SINGLE SET OF BLOOD CULTURES WHEN MULTIPLE SETS ARE DRAWN IS UNCERTAIN. PLEASE NOTIFY THE MICROBIOLOGY DEPARTMENT WITHIN ONE WEEK IF SPECIATION AND SENSITIVITIES ARE REQUIRED. Performed at [facility], [address] [place], [place] [address]    Report  Status 09/26/2018 FINAL  Final  Blood Culture ID Panel (Reflexed)     Status: Abnormal   Collection Time: 09/24/18  3:55 PM  Result Value Ref Range Status   Enterococcus species NOT DETECTED NOT DETECTED Final   Listeria monocytogenes NOT DETECTED NOT DETECTED Final   Staphylococcus species DETECTED (A) NOT DETECTED Final    Comment: Methicillin (oxacillin) resistant coagulase negative staphylococcus. Possible blood culture contaminant (unless isolated from more than one blood culture draw or clinical case suggests pathogenicity). No antibiotic treatment is indicated for blood  culture contaminants. CRITICAL RESULT CALLED TO, READ BACK BY AND VERIFIED WITH: J [place] PHARMD 09/25/18 1751 JDW    Staphylococcus aureus (BCID) NOT DETECTED NOT DETECTED Final   Methicillin resistance DETECTED (A) NOT DETECTED Final    Comment: CRITICAL RESULT CALLED TO, READ BACK BY AND VERIFIED WITH: J [place] PHARMD 09/25/18 1751 JDW    Streptococcus species NOT DETECTED NOT DETECTED Final   Streptococcus agalactiae NOT DETECTED NOT DETECTED Final   Streptococcus pneumoniae NOT DETECTED NOT DETECTED Final   Streptococcus pyogenes NOT DETECTED NOT DETECTED Final   Acinetobacter baumannii NOT DETECTED NOT DETECTED Final   Enterobacteriaceae species NOT DETECTED NOT DETECTED Final   Enterobacter cloacae complex NOT DETECTED NOT DETECTED Final   Escherichia coli NOT DETECTED NOT DETECTED Final   Klebsiella oxytoca NOT DETECTED NOT DETECTED Final   Klebsiella pneumoniae NOT DETECTED NOT DETECTED Final   Proteus species NOT DETECTED NOT DETECTED Final   Serratia marcescens NOT DETECTED NOT DETECTED Final   Haemophilus influenzae NOT DETECTED NOT DETECTED Final   Neisseria meningitidis NOT DETECTED NOT DETECTED Final   Pseudomonas aeruginosa NOT DETECTED NOT DETECTED Final   Candida albicans NOT DETECTED NOT DETECTED Final   Candida glabrata NOT DETECTED NOT DETECTED Final   Candida krusei NOT DETECTED NOT  DETECTED Final   Candida parapsilosis NOT DETECTED NOT DETECTED Final   Candida tropicalis NOT DETECTED NOT DETECTED Final    Comment: Performed at [facility], [address], [place], [place] [address]  Culture, blood (routine x 2)     Status: None (Preliminary result)   Collection Time: 09/24/18  3:59 PM  Result Value Ref Range Status   Specimen Description BLOOD HAND  Final   Special Requests   Final    BOTTLES DRAWN AEROBIC ONLY Blood Culture adequate volume   Culture   Final    NO GROWTH 3 DAYS Performed at [facility], [address], [place], [place] [address]    Report Status PENDING  Incomplete  Culture, Urine  Status: Abnormal   Collection Time: 09/24/18  4:22 PM  Result Value Ref Range Status   Specimen Description URINE, RANDOM  Final   Special Requests   Final    NONE Performed at [facility], [address] [place], [place] [address]    Culture >=100,000 COLONIES/mL ENTEROCOCCUS FAECALIS (A)  Final   Report Status 09/26/2018 FINAL  Final   Organism ID, Bacteria ENTEROCOCCUS FAECALIS (A)  Final      Susceptibility   Enterococcus faecalis - MIC*    AMPICILLIN <=2 SENSITIVE Sensitive     LEVOFLOXACIN >=8 RESISTANT Resistant     NITROFURANTOIN <=16 SENSITIVE Sensitive     VANCOMYCIN 1 SENSITIVE Sensitive     * >=100,000 COLONIES/mL ENTEROCOCCUS FAECALIS    Anti-infectives:  Anti-infectives (From admission, onward)   Start     Dose/Rate Route Frequency Ordered Stop   09/27/18 0830  ampicillin (OMNIPEN) 2 g in sodium chloride 0.9 % 100 mL IVPB     2 g 300 mL/hr over 20 Minutes Intravenous Every 6 hours 09/27/18 0822     09/25/18 2000  vancomycin (VANCOCIN) 1,250 mg in sodium chloride 0.9 % 250 mL IVPB  Status:  Discontinued     1,250 mg 166.7 mL/hr over 90 Minutes Intravenous Every 12 hours 09/25/18 1854 09/27/18 0822   09/25/18 0800  ceFEPIme (MAXIPIME) 2 g in sodium chloride 0.9 % 100 mL IVPB  Status:  Discontinued     2 g 200 mL/hr  over 30 Minutes Intravenous Every 8 hours 09/25/18 0750 09/27/18 0822   09/19/18 1257  polymyxin B 500,000 Units, bacitracin 50,000 Units in sodium chloride 0.9 % 500 mL irrigation  Status:  Discontinued       As needed 09/19/18 1257 09/19/18 1608   09/19/18 1100  ceFAZolin (ANCEF) IVPB 2g/100 mL premix     2 g 200 mL/hr over 30 Minutes Intravenous To Surgery 09/18/18 2055 09/19/18 1334   08/29/18 1512  polymyxin B 500,000 Units, bacitracin 50,000 Units in sodium chloride 0.9 % 500 mL irrigation  Status:  Discontinued       As needed 08/29/18 1512 08/29/18 1721   08/23/18 1200  meropenem (MERREM) 1 g in sodium chloride 0.9 % 100 mL IVPB     1 g 200 mL/hr over 30 Minutes Intravenous Every 8 hours 08/23/18 1122 09/05/18 1311   08/22/18 1400  ceFEPIme (MAXIPIME) 2 g in sodium chloride 0.9 % 100 mL IVPB  Status:  Discontinued     2 g 200 mL/hr over 30 Minutes Intravenous Every 8 hours 08/22/18 0836 08/22/18 1027   08/22/18 1200  meropenem (MERREM) 2 g in sodium chloride 0.9 % 100 mL IVPB  Status:  Discontinued     2 g 200 mL/hr over 30 Minutes Intravenous Every 8 hours 08/22/18 1027 08/23/18 1122   08/21/18 1400  ceFEPIme (MAXIPIME) 1 g in sodium chloride 0.9 % 100 mL IVPB  Status:  Discontinued     1 g 200 mL/hr over 30 Minutes Intravenous Every 8 hours 08/21/18 1042 08/22/18 0836   08/19/18 1030  cefTRIAXone (ROCEPHIN) 2 g in sodium chloride 0.9 % 100 mL IVPB  Status:  Discontinued     2 g 200 mL/hr over 30 Minutes Intravenous Every 24 hours 08/19/18 1017 08/21/18 1042   08/13/18 1400  piperacillin-tazobactam (ZOSYN) IVPB 3.375 g  Status:  Discontinued     3.375 g 12.5 mL/hr over 240 Minutes Intravenous Every 8 hours 08/13/18 0928 08/16/18 0854   08/13/18  1354  polymyxin B 500,000 Units, bacitracin 50,000 Units in sodium chloride 0.9 % 500 mL irrigation  Status:  Discontinued       As needed 08/13/18 1354 08/13/18 1538   08/13/18 0930  piperacillin-tazobactam (ZOSYN) IVPB 3.375 g     3.375  g 100 mL/hr over 30 Minutes Intravenous  Once 08/13/18 0928 08/13/18 1200   08/06/18 0801  polymyxin B 500,000 Units, bacitracin 50,000 Units in sodium chloride 0.9 % 500 mL irrigation  Status:  Discontinued       As needed 08/06/18 0803 08/06/18 0951   08/06/18 0600  ceFAZolin (ANCEF) IVPB 2g/100 mL premix  Status:  Discontinued     2 g 200 mL/hr over 30 Minutes Intravenous On call to O.R. 08/05/18 2241 08/05/18 2241   08/06/18 0600  ceFAZolin (ANCEF) IVPB 2g/100 mL premix  Status:  Discontinued     2 g 200 mL/hr over 30 Minutes Intravenous To Short Stay 08/05/18 2241 08/06/18 1024   08/01/18 2200  Ampicillin-Sulbactam (UNASYN) 3 g in sodium chloride 0.9 % 100 mL IVPB     3 g 200 mL/hr over 30 Minutes Intravenous Every 6 hours 08/01/18 2146 08/08/18 2235   08/01/18 1630  piperacillin-tazobactam (ZOSYN) IVPB 3.375 g  Status:  Discontinued     3.375 g 12.5 mL/hr over 240 Minutes Intravenous Every 8 hours 08/01/18 1624 08/01/18 2139   07/25/18 1508  polymyxin B 500,000 Units, bacitracin 50,000 Units in sodium chloride 0.9 % 500 mL irrigation  Status:  Discontinued       As needed 07/25/18 1509 07/25/18 1750   07/25/18 1445  piperacillin-tazobactam (ZOSYN) IVPB 3.375 g     3.375 g 100 mL/hr over 30 Minutes Intravenous STAT 07/25/18 1443 07/25/18 1620   07/25/18 0600  ceFAZolin (ANCEF) 3 g in dextrose 5 % 50 mL IVPB  Status:  Discontinued     3 g 100 mL/hr over 30 Minutes Intravenous To ShortStay Surgical 07/24/18 1735 07/25/18 1753   07/18/18 1444  polymyxin B 500,000 Units, bacitracin 50,000 Units in sodium chloride 0.9 % 500 mL irrigation  Status:  Discontinued       As needed 07/18/18 1445 07/18/18 1738   07/17/18 0900  piperacillin-tazobactam (ZOSYN) IVPB 3.375 g  Status:  Discontinued     3.375 g 12.5 mL/hr over 240 Minutes Intravenous Every 8 hours 07/17/18 0810 07/27/18 0806   07/12/18 1430  metronidazole (FLAGYL) IVPB 500 mg  Status:  Discontinued     500 mg 100 mL/hr over 60  Minutes Intravenous To Surgery 07/12/18 1427 07/12/18 1608   07/12/18 1430  cefTRIAXone (ROCEPHIN) 2 g in sodium chloride 0.9 % 100 mL IVPB  Status:  Discontinued     2 g 200 mL/hr over 30 Minutes Intravenous To Surgery 07/12/18 1427 07/12/18 1608   07/12/18 1245  tobramycin (NEBCIN) powder  Status:  Discontinued       As needed 07/12/18 1245 07/12/18 1542   07/12/18 1243  vancomycin (VANCOCIN) powder  Status:  Discontinued       As needed 07/12/18 1244 07/12/18 1542   07/11/18 1330  cefTRIAXone (ROCEPHIN) 2 g in sodium chloride 0.9 % 100 mL IVPB  Status:  Discontinued     2 g 200 mL/hr over 30 Minutes Intravenous Every 24 hours 07/11/18 1259 07/17/18 0810   07/11/18 1300  metroNIDAZOLE (FLAGYL) IVPB 500 mg  Status:  Discontinued     500 mg 100 mL/hr over 60 Minutes Intravenous Every 8 hours 07/11/18 1259 07/17/18 0810  07/11/18 0400  ceFAZolin (ANCEF) IVPB 2g/100 mL premix  Status:  Discontinued     2 g 200 mL/hr over 30 Minutes Intravenous Every 8 hours 07/10/18 2109 07/11/18 1259   07/10/18 1815  ceFAZolin (ANCEF) IVPB 2g/100 mL premix  Status:  Discontinued     2 g 200 mL/hr over 30 Minutes Intravenous  Once 07/10/18 1814 07/10/18 2339      Best Practice/Protocols:  VTE Prophylaxis: Lovenox (prophylaxtic dose) Intermittent Sedation  Consults: Treatment Team:  Md, Trauma, MD Haddix, Gillie Manners, MD Peggye Form, DO    Studies:    Events:  Subjective:    Overnight Issues:   Objective:  Vital signs for last 24 hours: Temp:  [98.6 F (37 C)-100.5 F (38.1 C)] 98.7 F (37.1 C) (04/17 0400) Pulse Rate:  [107-128] 111 (04/17 0600) Resp:  [8-28] 22 (04/17 0600) BP: (103-131)/(63-82) 113/70 (04/17 0600) SpO2:  [98 %-100 %] 99 % (04/17 0600) FiO2 (%):  [30 %] 30 % (04/17 0312)  Hemodynamic parameters for last 24 hours:    Intake/Output from previous day: 04/16 0701 - 04/17 0700 In: 3770 [NG/GT:3370; IV Piggyback:400] Out: 2700 [Urine:2350;  Stool:350]  Intake/Output this shift: No intake/output data recorded.  Vent settings for last 24 hours: Vent Mode: PRVC FiO2 (%):  [30 %] 30 % Set Rate:  [18 bmp] 18 bmp Vt Set:  [650 mL] 650 mL PEEP:  [5 cmH20] 5 cmH20 Plateau Pressure:  [16 cmH20-21 cmH20] 18 cmH20  Physical Exam:  General: on vent Neuro: awake and F/C well HEENT/Neck: trach-clean, intact Resp: clear to auscultation bilaterally CVS: RRR GI: soft, stma output is thick, PEG, SP Extremities: edema 1+  Results for orders placed or performed during the hospital encounter of 07/10/18 (from the past 24 hour(s))  Glucose, capillary     Status: Abnormal   Collection Time: 09/27/18 11:39 AM  Result Value Ref Range   Glucose-Capillary 178 (H) 70 - 99 mg/dL  Glucose, capillary     Status: Abnormal   Collection Time: 09/27/18  4:12 PM  Result Value Ref Range   Glucose-Capillary 131 (H) 70 - 99 mg/dL  Glucose, capillary     Status: Abnormal   Collection Time: 09/27/18  7:39 PM  Result Value Ref Range   Glucose-Capillary 144 (H) 70 - 99 mg/dL  Glucose, capillary     Status: Abnormal   Collection Time: 09/27/18 11:12 PM  Result Value Ref Range   Glucose-Capillary 129 (H) 70 - 99 mg/dL  Glucose, capillary     Status: Abnormal   Collection Time: 09/28/18  3:28 AM  Result Value Ref Range   Glucose-Capillary 152 (H) 70 - 99 mg/dL    Assessment & Plan: Present on Admission: .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

## 2018-09-28 NOTE — Progress Notes (Signed)
Patient ID: Christopher [name], male   DOB: [birth year], 53 y.o.   MRN: [id number] RN CM, the worker's comp case worker, and I did a conference call with his  fiancee and updated her on his progress. I answered their questions.  Violeta Gelinas, MD, MPH, FACS Trauma: [phone] General Surgery: [phone]

## 2018-09-28 NOTE — Progress Notes (Signed)
SLP Cancellation Note  Patient Details Name: Christopher [name] MRN: [id number] DOB: [birth year]   Cancelled treatment:        Scheduled with RT to see pt at 1500 with inline PMV however RT had conflict arise and needed to accompany another pt to CT. Will continue efforts next week.   [name] 09/28/2018, 3:10 PM   [name].Ed Nurse, children's [phone] Office [phone]

## 2018-09-29 LAB — GLUCOSE, CAPILLARY
Glucose-Capillary: 107 mg/dL — ABNORMAL HIGH (ref 70–99)
Glucose-Capillary: 170 mg/dL — ABNORMAL HIGH (ref 70–99)
Glucose-Capillary: 120 mg/dL — ABNORMAL HIGH (ref 70–99)
Glucose-Capillary: 165 mg/dL — ABNORMAL HIGH (ref 70–99)
Glucose-Capillary: 113 mg/dL — ABNORMAL HIGH (ref 70–99)
Glucose-Capillary: 181 mg/dL — ABNORMAL HIGH (ref 70–99)

## 2018-09-29 LAB — URINE CULTURE: Culture: NO GROWTH

## 2018-09-29 LAB — CULTURE, BLOOD (ROUTINE X 2)
Culture: NO GROWTH
Special Requests: ADEQUATE
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
## 2018-09-29 NOTE — Progress Notes (Signed)
Pt placed back on full vent support due to increased RR 

## 2018-09-30 LAB — GLUCOSE, CAPILLARY
Glucose-Capillary: 137 mg/dL — ABNORMAL HIGH (ref 70–99)
Glucose-Capillary: 115 mg/dL — ABNORMAL HIGH (ref 70–99)
Glucose-Capillary: 122 mg/dL — ABNORMAL HIGH (ref 70–99)
Glucose-Capillary: 177 mg/dL — ABNORMAL HIGH (ref 70–99)
Glucose-Capillary: 152 mg/dL — ABNORMAL HIGH (ref 70–99)
Glucose-Capillary: 138 mg/dL — ABNORMAL HIGH (ref 70–99)
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
## 2018-10-01 ENCOUNTER — Telehealth: Payer: Self-pay

## 2018-10-01 LAB — GLUCOSE, CAPILLARY
Glucose-Capillary: 139 mg/dL — ABNORMAL HIGH (ref 70–99)
Glucose-Capillary: 104 mg/dL — ABNORMAL HIGH (ref 70–99)
Glucose-Capillary: 157 mg/dL — ABNORMAL HIGH (ref 70–99)
Glucose-Capillary: 147 mg/dL — ABNORMAL HIGH (ref 70–99)
Glucose-Capillary: 121 mg/dL — ABNORMAL HIGH (ref 70–99)
Glucose-Capillary: 154 mg/dL — ABNORMAL HIGH (ref 70–99)

## 2018-10-01 NOTE — Progress Notes (Signed)
Chaplain on unit reviewed pt's chart, and offered silent prayer for him and his fiance [name].  Noted that family had rec'd spiritual care from Chaplains Orest Dikes and Pershing Proud, the on-call chaplains in late January when pt was first hospitalized. Will continue to be available. Rev. Lynnell Chad Pager [phone]

## 2018-10-01 NOTE — Progress Notes (Signed)
Patient ID: Christopher [name], male   DOB: 20-Feb-1966, 53 y.o.   MRN: [id number] Follow up - Trauma Critical Care  Patient Details:    Christopher [name] is an 53 y.o. male.  Lines/tubes : PICC Double Lumen 08/10/18 PICC Left Brachial 50 cm 1 cm (Active)  Indication for Insertion or Continuance of Line Prolonged intravenous therapies;Limited venous access - need for IV therapy >5 days (PICC only) 09/30/2018  8:00 PM  Exposed Catheter (cm) 1 cm 08/10/2018  3:39 PM  Site Assessment Clean;Dry;Intact 09/30/2018  8:00 PM  Lumen #1 Status In-line blood sampling system in place 09/30/2018  8:00 PM  Lumen #2 Status Infusing 09/30/2018  8:00 PM  Dressing Type Transparent;Occlusive;Securing device 09/30/2018  8:00 PM  Dressing Status Clean;Dry;Intact;Antimicrobial disc in place 09/30/2018  8:00 PM  Line Care Connections checked and tightened 09/30/2018  8:00 PM  Line Adjustment (NICU/IV Team Only) No 09/18/2018  8:00 PM  Dressing Intervention New dressing;Dressing changed;Antimicrobial disc changed;Securement device changed 09/28/2018  5:00 PM  Dressing Change Due 10/05/18 09/30/2018  8:00 PM     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)  Surrounding Skin Dry;Intact 10/01/2018  4:00 AM  Tube Status Patent 10/01/2018  4:00 AM  Drainage Appearance None 10/01/2018  4:00 AM  Dressing Status Clean;Dry;Intact 10/01/2018  4:00 AM  Dressing Intervention Dressing changed 09/27/2018  8:00 PM  Dressing Type Split gauze 09/30/2018  8:00 PM  Dressing Change Due 09/16/18 09/27/2018  8:00 PM  G Port Intake (mL) 150 ml 09/26/2018 10:00 PM  Output (mL) 75 mL 09/06/2018  6:00 PM     Colostomy LLQ (Active)  Ostomy Pouch 2 piece;Intact 10/01/2018  4:00 AM  Stoma Assessment Pink 10/01/2018  4:00 AM  Peristomal Assessment Intact 10/01/2018  4:00 AM  Treatment Pouch change 09/18/2018 11:00 PM  Output (mL) 175 mL 10/01/2018  6:00 AM     Suprapubic Catheter Non-latex 14 Fr. (Active)   Site Assessment Clean;Intact 10/01/2018  4:00 AM  Dressing Status None 10/01/2018  4:00 AM  Dressing Type Split gauze 09/29/2018  8:00 AM  Collection Container Standard drainage bag 10/01/2018  4:00 AM  Securement Method Securement Device;Taped 10/01/2018  4:00 AM  Indication for Insertion or Continuance of Catheter Bladder outlet obstruction / other urologic reason 09/28/2018  8:00 PM  Output (mL) 180 mL 10/01/2018  6:00 AM    Microbiology/Sepsis markers: Results for orders placed or performed during the hospital encounter of 07/10/18  MRSA PCR Screening     Status: None   Collection Time: 07/11/18  1:47 AM  Result Value Ref Range Status   MRSA by PCR NEGATIVE NEGATIVE Final    Comment:        The GeneXpert MRSA Assay (FDA approved for NASAL specimens only), is one component of a comprehensive MRSA colonization surveillance program. It is not intended to diagnose MRSA infection nor to guide or monitor treatment for MRSA infections. Performed at [facility], [address] [place], [place] [address]   Surgical pcr screen     Status: None   Collection Time: 07/12/18  8:11 AM  Result Value Ref Range Status   MRSA, PCR NEGATIVE NEGATIVE Final   Staphylococcus aureus NEGATIVE NEGATIVE Final    Comment: (NOTE) The Xpert SA Assay (FDA approved for NASAL specimens in patients 44 years of age and older), is one component of a comprehensive surveillance program. It is not intended to diagnose infection nor to guide or monitor treatment. Performed at [facility], [address] [place], [place] [address]   Culture, blood (Routine X 2) w Reflex to ID Panel     Status: None   Collection Time: 07/17/18  8:40 AM  Result Value Ref Range Status   Specimen Description BLOOD RIGHT ARM  Final   Special Requests   Final    BOTTLES DRAWN AEROBIC AND ANAEROBIC Blood Culture adequate volume   Culture   Final    NO GROWTH 5 DAYS Performed at [facility], [address], [place], [place] [address]    Report Status 07/22/2018 FINAL  Final  Culture, blood (Routine X 2) w Reflex to ID Panel     Status: None   Collection Time: 07/17/18  8:52 AM  Result Value Ref Range Status   Specimen Description BLOOD RIGHT HAND  Final   Special Requests   Final    BOTTLES DRAWN AEROBIC ONLY Blood Culture adequate volume   Culture   Final    NO GROWTH 5 DAYS Performed at [facility], [address], [place], [place] [address]    Report Status 07/22/2018 FINAL  Final  Culture, blood (Routine X 2) w Reflex to ID Panel     Status: None   Collection Time: 07/30/18  8:50 AM  Result Value Ref Range Status   Specimen Description BLOOD LEFT HAND  Final   Special Requests   Final    BOTTLES DRAWN AEROBIC ONLY Blood Culture results may not be optimal due to an inadequate volume of blood received in culture bottles Performed at [facility], [address], [place], [place] [address]    Culture NO GROWTH 5 DAYS  Final   Report Status 08/04/2018 FINAL  Final  Culture, blood (Routine X 2) w Reflex to ID Panel     Status: None   Collection Time: 07/30/18  9:56 AM  Result Value Ref Range Status   Specimen Description BLOOD LEFT ARM  Final   Special Requests   Final    BOTTLES DRAWN AEROBIC ONLY Blood Culture results may not be optimal due to an inadequate volume of blood received in culture bottles Performed at [facility], [address], [place], [place] [address]    Culture NO GROWTH 5 DAYS  Final   Report Status 08/04/2018 FINAL  Final  Culture, respiratory (non-expectorated)     Status: None   Collection Time: 07/30/18 11:38 AM  Result Value Ref Range Status   Specimen Description TRACHEAL ASPIRATE  Final   Special Requests Normal  Final   Gram Stain   Final    RARE WBC PRESENT, PREDOMINANTLY PMN RARE GRAM POSITIVE COCCI Performed at [facility], [address] [place], [place] [address]    Culture   Final    MODERATE ACINETOBACTER  CALCOACETICUS/BAUMANNII COMPLEX   Report Status 08/01/2018 FINAL  Final   Organism ID, Bacteria ACINETOBACTER CALCOACETICUS/BAUMANNII COMPLEX  Final      Susceptibility   Acinetobacter calcoaceticus/baumannii complex - MIC*    CEFTAZIDIME 8 SENSITIVE Sensitive     CEFTRIAXONE 32 INTERMEDIATE Intermediate     CIPROFLOXACIN <=0.25 SENSITIVE Sensitive     GENTAMICIN <=1 SENSITIVE Sensitive     IMIPENEM <=0.25 SENSITIVE Sensitive     PIP/TAZO <=4 SENSITIVE Sensitive     TRIMETH/SULFA <=20 SENSITIVE Sensitive     CEFEPIME 4 SENSITIVE Sensitive     AMPICILLIN/SULBACTAM <=2 SENSITIVE Sensitive     * MODERATE ACINETOBACTER CALCOACETICUS/BAUMANNII  COMPLEX  Culture, respiratory (non-expectorated)     Status: None   Collection Time: 08/13/18  9:22 AM  Result Value Ref Range Status   Specimen Description TRACHEAL ASPIRATE  Final   Special Requests Normal  Final   Gram Stain   Final    FEW WBC PRESENT, PREDOMINANTLY PMN MODERATE GRAM POSITIVE COCCI MODERATE GRAM NEGATIVE RODS    Culture   Final    Consistent with normal respiratory flora. Performed at [facility], [address], [place], [place] [address]    Report Status 08/15/2018 FINAL  Final  Culture, blood (Routine X 2) w Reflex to ID Panel     Status: None   Collection Time: 08/13/18  4:23 PM  Result Value Ref Range Status   Specimen Description BLOOD FOOT  Final   Special Requests   Final    BOTTLES DRAWN AEROBIC ONLY Blood Culture results may not be optimal due to an inadequate volume of blood received in culture bottles   Culture   Final    NO GROWTH 5 DAYS Performed at [facility], [address], [place], [place] [address]    Report Status 08/18/2018 FINAL  Final  Culture, blood (Routine X 2) w Reflex to ID Panel     Status: None   Collection Time: 08/13/18  4:41 PM  Result Value Ref Range Status   Specimen Description BLOOD FOOT  Final   Special Requests   Final    BOTTLES DRAWN AEROBIC ONLY Blood Culture  results may not be optimal due to an inadequate volume of blood received in culture bottles   Culture   Final    NO GROWTH 5 DAYS Performed at [facility], [address], [place], [place] [address]    Report Status 08/18/2018 FINAL  Final  Surgical pcr screen     Status: None   Collection Time: 08/16/18  4:03 AM  Result Value Ref Range Status   MRSA, PCR NEGATIVE NEGATIVE Final   Staphylococcus aureus NEGATIVE NEGATIVE Final    Comment: (NOTE) The Xpert SA Assay (FDA approved for NASAL specimens in patients 43 years of age and older), is one component of a comprehensive surveillance program. It is not intended to diagnose infection nor to guide or monitor treatment. Performed at [facility], [address] [place], [place] [address]   Culture, blood (routine x 2)     Status: Abnormal   Collection Time: 08/18/18 11:50 AM  Result Value Ref Range Status   Specimen Description BLOOD RIGHT HAND  Final   Special Requests   Final    BOTTLES DRAWN AEROBIC ONLY Blood Culture adequate volume   Culture  Setup Time   Final    GRAM NEGATIVE RODS AEROBIC BOTTLE ONLY CRITICAL RESULT CALLED TO, READ BACK BY AND VERIFIED WITH: V. BRYK,PHARMD [id number] 08/19/2018 T. TYSOR    Culture (A)  Final    SERRATIA MARCESCENS PSEUDOMONAS PUTIDA CRITICAL RESULT CALLED TO, READ BACK BY AND VERIFIED WITH: PHARMD M LORI [id number] AT 757 AM BY CM Performed at [facility], [address] [place], [place] [address]    Report Status 08/22/2018 FINAL  Final   Organism ID, Bacteria SERRATIA MARCESCENS  Final   Organism ID, Bacteria PSEUDOMONAS PUTIDA  Final      Susceptibility   Pseudomonas putida - MIC*    CEFTAZIDIME 16 INTERMEDIATE Intermediate     CIPROFLOXACIN 0.5 SENSITIVE Sensitive     GENTAMICIN <=1  SENSITIVE Sensitive     IMIPENEM 2 SENSITIVE Sensitive     PIP/TAZO >=128 RESISTANT Resistant     CEFEPIME 8 SENSITIVE Sensitive     * PSEUDOMONAS PUTIDA   Serratia marcescens - MIC*     CEFAZOLIN >=64 RESISTANT Resistant     CEFEPIME <=1 SENSITIVE Sensitive     CEFTAZIDIME <=1 SENSITIVE Sensitive     CEFTRIAXONE <=1 SENSITIVE Sensitive     CIPROFLOXACIN <=0.25 SENSITIVE Sensitive     GENTAMICIN <=1 SENSITIVE Sensitive     TRIMETH/SULFA <=20 SENSITIVE Sensitive     * SERRATIA MARCESCENS  Blood Culture ID Panel (Reflexed)     Status: Abnormal   Collection Time: 08/18/18 11:50 AM  Result Value Ref Range Status   Enterococcus species NOT DETECTED NOT DETECTED Final   Listeria monocytogenes NOT DETECTED NOT DETECTED Final   Staphylococcus species NOT DETECTED NOT DETECTED Final   Staphylococcus aureus (BCID) NOT DETECTED NOT DETECTED Final   Streptococcus species NOT DETECTED NOT DETECTED Final   Streptococcus agalactiae NOT DETECTED NOT DETECTED Final   Streptococcus pneumoniae NOT DETECTED NOT DETECTED Final   Streptococcus pyogenes NOT DETECTED NOT DETECTED Final   Acinetobacter baumannii NOT DETECTED NOT DETECTED Final   Enterobacteriaceae species DETECTED (A) NOT DETECTED Final    Comment: Enterobacteriaceae represent a large family of gram-negative bacteria, not a single organism. CRITICAL RESULT CALLED TO, READ BACK BY AND VERIFIED WITH: V. Joan Mayans [phone] 08/19/2018 T. TYSOR    Enterobacter cloacae complex NOT DETECTED NOT DETECTED Final   Escherichia coli NOT DETECTED NOT DETECTED Final   Klebsiella oxytoca NOT DETECTED NOT DETECTED Final   Klebsiella pneumoniae NOT DETECTED NOT DETECTED Final   Proteus species NOT DETECTED NOT DETECTED Final   Serratia marcescens DETECTED (A) NOT DETECTED Final    Comment: CRITICAL RESULT CALLED TO, READ BACK BY AND VERIFIED WITH: VJoan Mayans [phone] 08/19/2018 T. TYSOR    Carbapenem resistance NOT DETECTED NOT DETECTED Final   Haemophilus influenzae NOT DETECTED NOT DETECTED Final   Neisseria meningitidis NOT DETECTED NOT DETECTED Final   Pseudomonas aeruginosa NOT DETECTED NOT DETECTED Final   Candida albicans NOT  DETECTED NOT DETECTED Final   Candida glabrata NOT DETECTED NOT DETECTED Final   Candida krusei NOT DETECTED NOT DETECTED Final   Candida parapsilosis NOT DETECTED NOT DETECTED Final   Candida tropicalis NOT DETECTED NOT DETECTED Final    Comment: Performed at [facility], [address] [place], [place] [address]  Culture, blood (routine x 2)     Status: None   Collection Time: 08/18/18 11:58 AM  Result Value Ref Range Status   Specimen Description BLOOD RIGHT ANTECUBITAL  Final   Special Requests   Final    BOTTLES DRAWN AEROBIC ONLY Blood Culture adequate volume   Culture   Final    NO GROWTH 5 DAYS Performed at [facility], [address] [place], [place] [address]    Report Status 08/23/2018 FINAL  Final  MRSA PCR Screening     Status: None   Collection Time: 09/21/18  1:30 PM  Result Value Ref Range Status   MRSA by PCR NEGATIVE NEGATIVE Final    Comment:        The GeneXpert MRSA Assay (FDA approved for NASAL specimens only), is one component of a comprehensive MRSA colonization surveillance program. It is not intended to diagnose MRSA infection nor to guide or monitor treatment for MRSA infections. Performed at [facility], [address] [place], [place] [address]   Culture, blood (routine x 2)     Status: Abnormal   Collection Time: 09/24/18  3:55 PM  Result Value Ref Range Status   Specimen Description BLOOD RIGHT ANTECUBITAL  Final   Special Requests   Final    BOTTLES DRAWN AEROBIC ONLY Blood Culture adequate volume   Culture  Setup Time   Final    GRAM POSITIVE COCCI IN CLUSTERS AEROBIC BOTTLE ONLY CRITICAL RESULT CALLED TO, READ BACK BY AND VERIFIED WITH: J Franklin PHARMD 09/25/18 1751 JDW    Culture (A)  Final    STAPHYLOCOCCUS SPECIES (COAGULASE NEGATIVE) THE SIGNIFICANCE OF ISOLATING THIS ORGANISM FROM A SINGLE SET OF BLOOD CULTURES WHEN MULTIPLE SETS ARE DRAWN IS UNCERTAIN. PLEASE NOTIFY THE MICROBIOLOGY DEPARTMENT WITHIN ONE WEEK  IF SPECIATION AND SENSITIVITIES ARE REQUIRED. Performed at [facility], [address] [place], [place] [address]    Report Status 09/26/2018 FINAL  Final  Blood Culture ID Panel (Reflexed)     Status: Abnormal   Collection Time: 09/24/18  3:55 PM  Result Value Ref Range Status   Enterococcus species NOT DETECTED NOT DETECTED Final   Listeria monocytogenes NOT DETECTED NOT DETECTED Final   Staphylococcus species DETECTED (A) NOT DETECTED Final    Comment: Methicillin (oxacillin) resistant coagulase negative staphylococcus. Possible blood culture contaminant (unless isolated from more than one blood culture draw or clinical case suggests pathogenicity). No antibiotic treatment is indicated for blood  culture contaminants. CRITICAL RESULT CALLED TO, READ BACK BY AND VERIFIED WITH: J [place] PHARMD 09/25/18 1751 JDW    Staphylococcus aureus (BCID) NOT DETECTED NOT DETECTED Final   Methicillin resistance DETECTED (A) NOT DETECTED Final    Comment: CRITICAL RESULT CALLED TO, READ BACK BY AND VERIFIED WITH: J [place] PHARMD 09/25/18 1751 JDW    Streptococcus species NOT DETECTED NOT DETECTED Final   Streptococcus agalactiae NOT DETECTED NOT DETECTED Final   Streptococcus pneumoniae NOT DETECTED NOT DETECTED Final   Streptococcus pyogenes NOT DETECTED NOT DETECTED Final   Acinetobacter baumannii NOT DETECTED NOT DETECTED Final   Enterobacteriaceae species NOT DETECTED NOT DETECTED Final   Enterobacter cloacae complex NOT DETECTED NOT DETECTED Final   Escherichia coli NOT DETECTED NOT DETECTED Final   Klebsiella oxytoca NOT DETECTED NOT DETECTED Final   Klebsiella pneumoniae NOT DETECTED NOT DETECTED Final   Proteus species NOT DETECTED NOT DETECTED Final   Serratia marcescens NOT DETECTED NOT DETECTED Final   Haemophilus influenzae NOT DETECTED NOT DETECTED Final   Neisseria meningitidis NOT DETECTED NOT DETECTED Final   Pseudomonas aeruginosa NOT DETECTED NOT DETECTED Final   Candida  albicans NOT DETECTED NOT DETECTED Final   Candida glabrata NOT DETECTED NOT DETECTED Final   Candida krusei NOT DETECTED NOT DETECTED Final   Candida parapsilosis NOT DETECTED NOT DETECTED Final   Candida tropicalis NOT DETECTED NOT DETECTED Final    Comment: Performed at [facility], [address] [place], [place] [address]  Culture, blood (routine x 2)     Status: None   Collection Time: 09/24/18  3:59 PM  Result Value Ref Range Status   Specimen Description BLOOD HAND  Final   Special Requests   Final    BOTTLES DRAWN AEROBIC ONLY Blood Culture adequate volume   Culture   Final    NO GROWTH 5 DAYS Performed at [facility], [address] [place], [place] [address]    Report Status 09/29/2018 FINAL  Final  Culture, Urine     Status: Abnormal   Collection Time: 09/24/18  4:22 PM  Result Value Ref Range Status   Specimen Description URINE, RANDOM  Final   Special Requests   Final    NONE Performed at [facility], [address] [place], [place] [address]    Culture >=100,000 COLONIES/mL ENTEROCOCCUS FAECALIS (A)  Final   Report Status 09/26/2018 FINAL  Final   Organism ID, Bacteria ENTEROCOCCUS FAECALIS (A)  Final      Susceptibility   Enterococcus faecalis - MIC*    AMPICILLIN <=2 SENSITIVE Sensitive     LEVOFLOXACIN >=8 RESISTANT Resistant     NITROFURANTOIN <=16 SENSITIVE Sensitive     VANCOMYCIN 1 SENSITIVE Sensitive     * >=100,000 COLONIES/mL ENTEROCOCCUS FAECALIS  Culture, Urine     Status: None   Collection Time: 09/28/18  6:53 PM  Result Value Ref Range Status   Specimen Description URINE, CATHETERIZED  Final   Special Requests Immunocompromised  Final   Culture   Final    NO GROWTH Performed at [facility], [address] [place], [place] [address]    Report Status 09/29/2018 FINAL  Final    Anti-infectives:  Anti-infectives (From admission, onward)   Start     Dose/Rate Route Frequency Ordered Stop   09/27/18 0830   ampicillin (OMNIPEN) 2 g in sodium chloride 0.9 % 100 mL IVPB     2 g 300 mL/hr over 20 Minutes Intravenous Every 6 hours 09/27/18 0822     09/25/18 2000  vancomycin (VANCOCIN) 1,250 mg in sodium chloride 0.9 % 250 mL IVPB  Status:  Discontinued     1,250 mg 166.7 mL/hr over 90 Minutes Intravenous Every 12 hours 09/25/18 1854 09/27/18 0822   09/25/18 0800  ceFEPIme (MAXIPIME) 2 g in sodium chloride 0.9 % 100 mL IVPB  Status:  Discontinued     2 g 200 mL/hr over 30 Minutes Intravenous Every 8 hours 09/25/18 0750 09/27/18 0822   09/19/18 1257  polymyxin B 500,000 Units, bacitracin 50,000 Units in sodium chloride 0.9 % 500 mL irrigation  Status:  Discontinued       As needed 09/19/18 1257 09/19/18 1608   09/19/18 1100  ceFAZolin (ANCEF) IVPB 2g/100 mL premix     2 g 200 mL/hr over 30 Minutes Intravenous To Surgery 09/18/18 2055 09/19/18 1334   08/29/18 1512  polymyxin B 500,000 Units, bacitracin 50,000 Units in sodium chloride 0.9 % 500 mL irrigation  Status:  Discontinued       As needed 08/29/18 1512 08/29/18 1721   08/23/18 1200  meropenem (MERREM) 1 g in sodium chloride 0.9 % 100 mL IVPB     1 g 200 mL/hr over 30 Minutes Intravenous Every 8 hours 08/23/18 1122 09/05/18 1311   08/22/18 1400  ceFEPIme (MAXIPIME) 2 g in sodium chloride 0.9 % 100 mL IVPB  Status:  Discontinued     2 g 200 mL/hr over 30 Minutes Intravenous Every 8 hours 08/22/18 0836 08/22/18 1027   08/22/18 1200  meropenem (MERREM) 2 g in sodium chloride 0.9 % 100 mL IVPB  Status:  Discontinued     2 g 200 mL/hr over 30 Minutes Intravenous Every 8 hours 08/22/18 1027 08/23/18 1122   08/21/18 1400  ceFEPIme (MAXIPIME) 1 g in sodium chloride 0.9 % 100 mL IVPB  Status:  Discontinued     1 g 200 mL/hr over 30 Minutes Intravenous Every 8 hours 08/21/18  1042 08/22/18 0836   08/19/18 1030  cefTRIAXone (ROCEPHIN) 2 g in sodium chloride 0.9 % 100 mL IVPB  Status:  Discontinued     2 g 200 mL/hr over 30 Minutes Intravenous Every 24  hours 08/19/18 1017 08/21/18 1042   08/13/18 1400  piperacillin-tazobactam (ZOSYN) IVPB 3.375 g  Status:  Discontinued     3.375 g 12.5 mL/hr over 240 Minutes Intravenous Every 8 hours 08/13/18 0928 08/16/18 0854   08/13/18 1354  polymyxin B 500,000 Units, bacitracin 50,000 Units in sodium chloride 0.9 % 500 mL irrigation  Status:  Discontinued       As needed 08/13/18 1354 08/13/18 1538   08/13/18 0930  piperacillin-tazobactam (ZOSYN) IVPB 3.375 g     3.375 g 100 mL/hr over 30 Minutes Intravenous  Once 08/13/18 0928 08/13/18 1200   08/06/18 0801  polymyxin B 500,000 Units, bacitracin 50,000 Units in sodium chloride 0.9 % 500 mL irrigation  Status:  Discontinued       As needed 08/06/18 0803 08/06/18 0951   08/06/18 0600  ceFAZolin (ANCEF) IVPB 2g/100 mL premix  Status:  Discontinued     2 g 200 mL/hr over 30 Minutes Intravenous On call to O.R. 08/05/18 2241 08/05/18 2241   08/06/18 0600  ceFAZolin (ANCEF) IVPB 2g/100 mL premix  Status:  Discontinued     2 g 200 mL/hr over 30 Minutes Intravenous To Short Stay 08/05/18 2241 08/06/18 1024   08/01/18 2200  Ampicillin-Sulbactam (UNASYN) 3 g in sodium chloride 0.9 % 100 mL IVPB     3 g 200 mL/hr over 30 Minutes Intravenous Every 6 hours 08/01/18 2146 08/08/18 2235   08/01/18 1630  piperacillin-tazobactam (ZOSYN) IVPB 3.375 g  Status:  Discontinued     3.375 g 12.5 mL/hr over 240 Minutes Intravenous Every 8 hours 08/01/18 1624 08/01/18 2139   07/25/18 1508  polymyxin B 500,000 Units, bacitracin 50,000 Units in sodium chloride 0.9 % 500 mL irrigation  Status:  Discontinued       As needed 07/25/18 1509 07/25/18 1750   07/25/18 1445  piperacillin-tazobactam (ZOSYN) IVPB 3.375 g     3.375 g 100 mL/hr over 30 Minutes Intravenous STAT 07/25/18 1443 07/25/18 1620   07/25/18 0600  ceFAZolin (ANCEF) 3 g in dextrose 5 % 50 mL IVPB  Status:  Discontinued     3 g 100 mL/hr over 30 Minutes Intravenous To ShortStay Surgical 07/24/18 1735 07/25/18 1753    07/18/18 1444  polymyxin B 500,000 Units, bacitracin 50,000 Units in sodium chloride 0.9 % 500 mL irrigation  Status:  Discontinued       As needed 07/18/18 1445 07/18/18 1738   07/17/18 0900  piperacillin-tazobactam (ZOSYN) IVPB 3.375 g  Status:  Discontinued     3.375 g 12.5 mL/hr over 240 Minutes Intravenous Every 8 hours 07/17/18 0810 07/27/18 0806   07/12/18 1430  metronidazole (FLAGYL) IVPB 500 mg  Status:  Discontinued     500 mg 100 mL/hr over 60 Minutes Intravenous To Surgery 07/12/18 1427 07/12/18 1608   07/12/18 1430  cefTRIAXone (ROCEPHIN) 2 g in sodium chloride 0.9 % 100 mL IVPB  Status:  Discontinued     2 g 200 mL/hr over 30 Minutes Intravenous To Surgery 07/12/18 1427 07/12/18 1608   07/12/18 1245  tobramycin (NEBCIN) powder  Status:  Discontinued       As needed 07/12/18 1245 07/12/18 1542   07/12/18 1243  vancomycin (VANCOCIN) powder  Status:  Discontinued  As needed 07/12/18 1244 07/12/18 1542   07/11/18 1330  cefTRIAXone (ROCEPHIN) 2 g in sodium chloride 0.9 % 100 mL IVPB  Status:  Discontinued     2 g 200 mL/hr over 30 Minutes Intravenous Every 24 hours 07/11/18 1259 07/17/18 0810   07/11/18 1300  metroNIDAZOLE (FLAGYL) IVPB 500 mg  Status:  Discontinued     500 mg 100 mL/hr over 60 Minutes Intravenous Every 8 hours 07/11/18 1259 07/17/18 0810   07/11/18 0400  ceFAZolin (ANCEF) IVPB 2g/100 mL premix  Status:  Discontinued     2 g 200 mL/hr over 30 Minutes Intravenous Every 8 hours 07/10/18 2109 07/11/18 1259   07/10/18 1815  ceFAZolin (ANCEF) IVPB 2g/100 mL premix  Status:  Discontinued     2 g 200 mL/hr over 30 Minutes Intravenous  Once 07/10/18 1814 07/10/18 2339      Best Practice/Protocols:  Lovenox .  Consults: Treatment Team:  [place], Trauma, MD Haddix, Gillie Manners, MD Peggye Form, DO   Subjective:    Overnight Issues:   Objective:  Vital signs for last 24 hours: Temp:  [98.4 F (36.9 C)-100.2 F (37.9 C)] 98.9 F (37.2 C) (04/20  0400) Pulse Rate:  [98-134] 104 (04/20 0600) Resp:  [15-43] 18 (04/20 0600) BP: (101-123)/(58-78) 114/64 (04/20 0600) SpO2:  [96 %-100 %] 100 % (04/20 0600) FiO2 (%):  [30 %] 30 % (04/20 0335)  Hemodynamic parameters for last 24 hours:    Intake/Output from previous day: 04/19 0701 - 04/20 0700 In: 2920 [I.V.:240; NG/GT:2280; IV Piggyback:400] Out: 3430 [Urine:3105; Stool:325]  Intake/Output this shift: No intake/output data recorded.  Vent settings for last 24 hours: Vent Mode: PRVC FiO2 (%):  [30 %] 30 % Set Rate:  [18 bmp] 18 bmp Vt Set:  [650 mL] 650 mL PEEP:  [5 cmH20] 5 cmH20 Pressure Support:  [12 cmH20] 12 cmH20 Plateau Pressure:  [15 cmH20-18 cmH20] 18 cmH20  Physical Exam:  General: on vent Neuro: awakens and F/C HEENT/Neck: trach-clean, intact Resp: clear to auscultation bilaterally CVS: RRR GI: soft, PEG, stoma, SP tube draining urine Extremities: edema 2+  Results for orders placed or performed during the hospital encounter of 07/10/18 (from the past 24 hour(s))  Glucose, capillary     Status: Abnormal   Collection Time: 09/30/18 11:13 AM  Result Value Ref Range   Glucose-Capillary 122 (H) 70 - 99 mg/dL   Comment 1 Notify RN    Comment 2 Document in Chart   Glucose, capillary     Status: Abnormal   Collection Time: 09/30/18  3:51 PM  Result Value Ref Range   Glucose-Capillary 177 (H) 70 - 99 mg/dL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

## 2018-10-01 NOTE — Telephone Encounter (Signed)
Went to see pt in hosp- Rm 4NC/18 Per [name] RN- increased drainage from posterior wound-  protective pads used on be are saturated & changed 2x daily-  The anterior (L) leg wound is intact- they are changing this dressing daily- using ky gel on acell -gauze kerlix & tape Per RN - he is less responsive than before- but no other changes noted.  [employer]
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
## 2018-10-02 LAB — BASIC METABOLIC PANEL
Anion gap: 9 (ref 5–15)
BUN: 33 mg/dL — ABNORMAL HIGH (ref 6–20)
CO2: 26 mmol/L (ref 22–32)
Calcium: 9.9 mg/dL (ref 8.9–10.3)
Chloride: 115 mmol/L — ABNORMAL HIGH (ref 98–111)
Creatinine, Ser: <0.3 mg/dL — ABNORMAL LOW (ref 0.61–1.24)
Glucose, Bld: 156 mg/dL — ABNORMAL HIGH (ref 70–99)
Potassium: 3.4 mmol/L — ABNORMAL LOW (ref 3.5–5.1)
Sodium: 150 mmol/L — ABNORMAL HIGH (ref 135–145)

## 2018-10-02 LAB — CBC
HCT: 25.2 % — ABNORMAL LOW (ref 39.0–52.0)
Hemoglobin: 7.1 g/dL — ABNORMAL LOW (ref 13.0–17.0)
MCH: 32.1 pg (ref 26.0–34.0)
MCHC: 28.2 g/dL — ABNORMAL LOW (ref 30.0–36.0)
MCV: 114 fL — ABNORMAL HIGH (ref 80.0–100.0)
Platelets: 397 10*3/uL (ref 150–400)
RBC: 2.21 MIL/uL — ABNORMAL LOW (ref 4.22–5.81)
RDW: 19.5 % — ABNORMAL HIGH (ref 11.5–15.5)
WBC: 6.1 10*3/uL (ref 4.0–10.5)
nRBC: 0 % (ref 0.0–0.2)

## 2018-10-02 LAB — GLUCOSE, CAPILLARY
Glucose-Capillary: 127 mg/dL — ABNORMAL HIGH (ref 70–99)
Glucose-Capillary: 127 mg/dL — ABNORMAL HIGH (ref 70–99)
Glucose-Capillary: 137 mg/dL — ABNORMAL HIGH (ref 70–99)
Glucose-Capillary: 163 mg/dL — ABNORMAL HIGH (ref 70–99)
Glucose-Capillary: 95 mg/dL (ref 70–99)
Glucose-Capillary: 98 mg/dL (ref 70–99)

## 2018-10-02 MED ORDER — ALTEPLASE 2 MG IJ SOLR
2.0000 mg | Freq: Once | INTRAMUSCULAR | Status: DC
  Filled 2018-10-02: qty 2

## 2018-10-02 MED ORDER — CEFAZOLIN SODIUM-DEXTROSE 2-4 GM/100ML-% IV SOLN
2.0000 g | INTRAVENOUS | Status: AC
  Administered 2018-10-03: 2 g via INTRAVENOUS
  Filled 2018-10-02 (×2): qty 100

## 2018-10-02 MED ORDER — CHLORHEXIDINE GLUCONATE CLOTH 2 % EX PADS
6.0000 | MEDICATED_PAD | Freq: Once | CUTANEOUS | Status: AC
  Administered 2018-10-03: 6 via TOPICAL

## 2018-10-02 MED ORDER — ALTEPLASE 2 MG IJ SOLR
2.0000 mg | Freq: Once | INTRAMUSCULAR | Status: AC
  Administered 2018-10-02: 2 mg
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
## 2018-10-02 NOTE — Progress Notes (Signed)
Patient ID: Christopher [name], male   DOB: November 22, 1965, 53 y.o.   MRN: [id number] Follow up - Trauma Critical Care  Patient Details:    Christopher [name] is an 53 y.o. male.  Lines/tubes : PICC Double Lumen 08/10/18 PICC Left Brachial 50 cm 1 cm (Active)  Indication for Insertion or Continuance of Line Prolonged intravenous therapies 10/01/2018  9:00 PM  Exposed Catheter (cm) 1 cm 08/10/2018  3:39 PM  Site Assessment Clean;Dry;Intact 10/01/2018  9:00 PM  Lumen #1 Status In-line blood sampling system in place;Blood return noted 10/01/2018  9:00 PM  Lumen #2 Status Infusing;Flushed 10/01/2018  9:00 PM  Dressing Type Transparent;Occlusive 10/01/2018  9:00 PM  Dressing Status Clean;Dry;Intact;Antimicrobial disc in place 10/01/2018  9:00 PM  Line Care Connections checked and tightened 10/01/2018  9:00 PM  Line Adjustment (NICU/IV Team Only) No 09/18/2018  8:00 PM  Dressing Intervention New dressing;Dressing changed;Antimicrobial disc changed;Securement device changed 09/28/2018  5:00 PM  Dressing Change Due 10/05/18 10/01/2018  9:00 PM     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)  Surrounding Skin Dry;Intact 10/01/2018  8:00 PM  Tube Status Patent 10/01/2018  8:00 PM  Drainage Appearance None 10/01/2018  4:00 AM  Dressing Status Clean;Intact;Dry 10/01/2018  8:00 PM  Dressing Intervention Dressing changed 09/27/2018  8:00 PM  Dressing Change Due 09/16/18 09/27/2018  8:00 PM  G Port Intake (mL) 30 ml 10/01/2018  9:00 PM  Output (mL) 75 mL 09/06/2018  6:00 PM     Colostomy LLQ (Active)  Ostomy Pouch 2 piece 10/01/2018  8:00 PM  Stoma Assessment Pink 10/01/2018  8:00 PM  Peristomal Assessment Intact 10/01/2018  8:00 PM  Treatment Pouch change 10/02/2018  1:00 AM  Output (mL) 150 mL 10/02/2018  6:00 AM     Suprapubic Catheter Non-latex 14 Fr. 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 It is not intended to diagnose MRSA infection nor to guide or monitor treatment for MRSA infections. Performed at [facility], [address] [place], [place] [address]   Surgical pcr screen     Status: None   Collection Time: 07/12/18  8:11 AM  Result Value Ref Range Status   MRSA, PCR NEGATIVE NEGATIVE Final   Staphylococcus aureus NEGATIVE NEGATIVE Final    Comment: (NOTE) The Xpert SA Assay (FDA approved for NASAL specimens in patients 85 years of age and older), is one component of a comprehensive surveillance program. It is not intended to diagnose infection nor to guide or monitor treatment. Performed at [facility], [address], [place], [place] [address]   Culture, blood (Routine X 2)  w Reflex to ID Panel     Status: None   Collection Time: 07/17/18  8:40 AM  Result Value Ref Range Status   Specimen Description BLOOD RIGHT ARM  Final   Special Requests   Final    BOTTLES DRAWN AEROBIC AND ANAEROBIC Blood Culture adequate volume   Culture   Final    NO GROWTH 5 DAYS Performed at [facility], [address], [place], [place] [address]    Report Status 07/22/2018 FINAL  Final  Culture, blood (Routine X 2) w Reflex to  ID Panel     Status: None   Collection Time: 07/17/18  8:52 AM  Result Value Ref Range Status   Specimen Description BLOOD RIGHT HAND  Final   Special Requests   Final    BOTTLES DRAWN AEROBIC ONLY Blood Culture adequate volume   Culture   Final    NO GROWTH 5 DAYS Performed at [facility], [address], [place], [place] [address]    Report Status 07/22/2018 FINAL  Final  Culture, blood (Routine X 2) w Reflex to ID Panel     Status: None   Collection Time: 07/30/18  8:50 AM  Result Value Ref Range Status   Specimen Description BLOOD LEFT HAND  Final   Special Requests   Final    BOTTLES DRAWN AEROBIC ONLY Blood Culture results may not be optimal due to an inadequate volume of blood received in culture bottles Performed at [facility], [address], [place], [place] [address]    Culture NO GROWTH 5 DAYS  Final   Report Status 08/04/2018 FINAL  Final  Culture, blood (Routine X 2) w Reflex to ID Panel     Status: None   Collection Time: 07/30/18  9:56 AM  Result Value Ref Range Status   Specimen Description BLOOD LEFT ARM  Final   Special Requests   Final    BOTTLES DRAWN AEROBIC ONLY Blood Culture results may not be optimal due to an inadequate volume of blood received in culture bottles Performed at [facility], [address] [place], [place] [address]    Culture NO GROWTH 5 DAYS  Final   Report Status 08/04/2018 FINAL  Final  Culture, respiratory (non-expectorated)     Status: None   Collection Time: 07/30/18 11:38 AM  Result Value Ref Range Status   Specimen Description TRACHEAL ASPIRATE  Final   Special Requests Normal  Final   Gram Stain   Final    RARE WBC PRESENT, PREDOMINANTLY PMN RARE GRAM POSITIVE COCCI Performed at [facility], [address], [place], [place] [address]    Culture   Final    MODERATE ACINETOBACTER CALCOACETICUS/BAUMANNII COMPLEX   Report Status 08/01/2018 FINAL  Final   Organism ID, Bacteria ACINETOBACTER  CALCOACETICUS/BAUMANNII COMPLEX  Final      Susceptibility   Acinetobacter calcoaceticus/baumannii complex - MIC*    CEFTAZIDIME 8 SENSITIVE Sensitive     CEFTRIAXONE 32 INTERMEDIATE Intermediate     CIPROFLOXACIN <=0.25 SENSITIVE Sensitive     GENTAMICIN <=1 SENSITIVE Sensitive     IMIPENEM <=0.25 SENSITIVE Sensitive     PIP/TAZO <=4 SENSITIVE Sensitive     TRIMETH/SULFA <=20 SENSITIVE Sensitive     CEFEPIME 4 SENSITIVE Sensitive     AMPICILLIN/SULBACTAM <=2 SENSITIVE Sensitive     * MODERATE ACINETOBACTER CALCOACETICUS/BAUMANNII COMPLEX  Culture, respiratory (non-expectorated)     Status: None   Collection Time: 08/13/18  9:22 AM  Result Value Ref Range Status   Specimen Description TRACHEAL ASPIRATE  Final   Special Requests Normal  Final   Gram Stain   Final    FEW WBC PRESENT, PREDOMINANTLY PMN MODERATE GRAM POSITIVE COCCI MODERATE GRAM NEGATIVE RODS    Culture   Final    Consistent with normal respiratory flora. Performed at [facility], [address], [place], [place] [address]    Report Status 08/15/2018 FINAL  Final  Culture, blood (Routine X 2) w Reflex to ID Panel     Status: None   Collection Time: 08/13/18  4:23 PM  Result Value Ref Range Status   Specimen Description BLOOD FOOT  Final   Special Requests   Final    BOTTLES DRAWN AEROBIC ONLY Blood Culture results may not be optimal due to an inadequate volume of blood received in culture bottles   Culture   Final    NO GROWTH 5 DAYS Performed at [facility], [address], [place], [place] [address]    Report Status 08/18/2018 FINAL  Final  Culture, blood (Routine X 2) w Reflex to ID Panel     Status: None   Collection Time: 08/13/18  4:41 PM  Result Value Ref Range Status   Specimen Description BLOOD FOOT  Final   Special Requests   Final    BOTTLES DRAWN AEROBIC ONLY Blood Culture results may not be optimal due to an inadequate volume of blood received in culture bottles   Culture   Final     NO GROWTH 5 DAYS Performed at [facility], [address], [place], [place] [address]    Report Status 08/18/2018 FINAL  Final  Surgical pcr screen     Status: None   Collection Time: 08/16/18  4:03 AM  Result Value Ref Range Status   MRSA, PCR NEGATIVE NEGATIVE Final   Staphylococcus aureus NEGATIVE NEGATIVE Final    Comment: (NOTE) The Xpert SA Assay (FDA approved for NASAL specimens in patients 44 years of age and older), is one component of a comprehensive surveillance program. It is not intended to diagnose infection nor to guide or monitor treatment. Performed at [facility], [address], [place], [place] [address]   Culture, blood (routine x 2)     Status: Abnormal   Collection Time: 08/18/18 11:50 AM  Result Value Ref Range Status   Specimen Description BLOOD RIGHT HAND  Final   Special Requests   Final    BOTTLES DRAWN AEROBIC ONLY Blood Culture adequate volume   Culture  Setup Time   Final    GRAM NEGATIVE RODS AEROBIC BOTTLE ONLY CRITICAL RESULT CALLED TO, READ BACK BY AND VERIFIED WITH: V. BRYK,PHARMD [id number] 08/19/2018 T. TYSOR    Culture (A)  Final    SERRATIA MARCESCENS PSEUDOMONAS PUTIDA CRITICAL RESULT CALLED TO, READ BACK BY AND VERIFIED WITH: PHARMD M LORI [id number] AT 757 AM BY CM Performed at [facility], [address], [place], [place] [address]    Report Status 08/22/2018 FINAL  Final   Organism ID, Bacteria SERRATIA MARCESCENS  Final   Organism ID, Bacteria PSEUDOMONAS PUTIDA  Final      Susceptibility   Pseudomonas putida - MIC*    CEFTAZIDIME 16 INTERMEDIATE Intermediate     CIPROFLOXACIN 0.5 SENSITIVE Sensitive     GENTAMICIN <=1 SENSITIVE Sensitive     IMIPENEM 2 SENSITIVE Sensitive     PIP/TAZO >=128 RESISTANT  Resistant     CEFEPIME 8 SENSITIVE Sensitive     * PSEUDOMONAS PUTIDA   Serratia marcescens - MIC*    CEFAZOLIN >=64 RESISTANT Resistant     CEFEPIME <=1 SENSITIVE Sensitive     CEFTAZIDIME <=1 SENSITIVE Sensitive      CEFTRIAXONE <=1 SENSITIVE Sensitive     CIPROFLOXACIN <=0.25 SENSITIVE Sensitive     GENTAMICIN <=1 SENSITIVE Sensitive     TRIMETH/SULFA <=20 SENSITIVE Sensitive     * SERRATIA MARCESCENS  Blood Culture ID Panel (Reflexed)     Status: Abnormal   Collection Time: 08/18/18 11:50 AM  Result Value Ref Range Status   Enterococcus species NOT DETECTED NOT DETECTED Final   Listeria monocytogenes NOT DETECTED NOT DETECTED Final   Staphylococcus species NOT DETECTED NOT DETECTED Final   Staphylococcus aureus (BCID) NOT DETECTED NOT DETECTED Final   Streptococcus species NOT DETECTED NOT DETECTED Final   Streptococcus agalactiae NOT DETECTED NOT DETECTED Final   Streptococcus pneumoniae NOT DETECTED NOT DETECTED Final   Streptococcus pyogenes NOT DETECTED NOT DETECTED Final   Acinetobacter baumannii NOT DETECTED NOT DETECTED Final   Enterobacteriaceae species DETECTED (A) NOT DETECTED Final    Comment: Enterobacteriaceae represent a large family of gram-negative bacteria, not a single organism. CRITICAL RESULT CALLED TO, READ BACK BY AND VERIFIED WITH: V. Joan Mayans [id number] 08/19/2018 T. TYSOR    Enterobacter cloacae complex NOT DETECTED NOT DETECTED Final   Escherichia coli NOT DETECTED NOT DETECTED Final   Klebsiella oxytoca NOT DETECTED NOT DETECTED Final   Klebsiella pneumoniae NOT DETECTED NOT DETECTED Final   Proteus species NOT DETECTED NOT DETECTED Final   Serratia marcescens DETECTED (A) NOT DETECTED Final    Comment: CRITICAL RESULT CALLED TO, READ BACK BY AND VERIFIED WITH: VJoan Mayans [id number] 08/19/2018 T. TYSOR    Carbapenem resistance NOT DETECTED NOT DETECTED Final   Haemophilus influenzae NOT DETECTED NOT DETECTED Final   Neisseria meningitidis NOT DETECTED NOT DETECTED Final   Pseudomonas aeruginosa NOT DETECTED NOT DETECTED Final   Candida albicans NOT DETECTED NOT DETECTED Final   Candida glabrata NOT DETECTED NOT DETECTED Final   Candida krusei NOT DETECTED NOT  DETECTED Final   Candida parapsilosis NOT DETECTED NOT DETECTED Final   Candida tropicalis NOT DETECTED NOT DETECTED Final    Comment: Performed at [facility], [address] [place], [place] [address]  Culture, blood (routine x 2)     Status: None   Collection Time: 08/18/18 11:58 AM  Result Value Ref Range Status   Specimen Description BLOOD RIGHT ANTECUBITAL  Final   Special Requests   Final    BOTTLES DRAWN AEROBIC ONLY Blood Culture adequate volume   Culture   Final    NO GROWTH 5 DAYS Performed at [facility], [address], [place], [place] [address]    Report Status 08/23/2018 FINAL  Final  MRSA PCR Screening     Status: None   Collection Time: 09/21/18  1:30 PM  Result Value Ref Range Status   MRSA by PCR NEGATIVE NEGATIVE Final    Comment:        The GeneXpert MRSA Assay (FDA approved for NASAL specimens only), is one component of a comprehensive MRSA colonization surveillance program. It is not intended to diagnose MRSA infection nor to guide or monitor treatment for MRSA infections. Performed at [facility], [address], [place], [place] [address]   Culture, blood (routine x 2)  Status: Abnormal   Collection Time: 09/24/18  3:55 PM  Result Value Ref Range Status   Specimen Description BLOOD RIGHT ANTECUBITAL  Final   Special Requests   Final    BOTTLES DRAWN AEROBIC ONLY Blood Culture adequate volume   Culture  Setup Time   Final    GRAM POSITIVE COCCI IN CLUSTERS AEROBIC BOTTLE ONLY CRITICAL RESULT CALLED TO, READ BACK BY AND VERIFIED WITH: J [place] PHARMD 09/25/18 1751 JDW    Culture (A)  Final    STAPHYLOCOCCUS SPECIES (COAGULASE NEGATIVE) THE SIGNIFICANCE OF ISOLATING THIS ORGANISM FROM A SINGLE SET OF BLOOD CULTURES WHEN MULTIPLE SETS ARE DRAWN IS UNCERTAIN. PLEASE NOTIFY THE MICROBIOLOGY DEPARTMENT WITHIN ONE WEEK IF SPECIATION AND SENSITIVITIES ARE REQUIRED. Performed at [facility], [address], [place],  [place] [address]    Report Status 09/26/2018 FINAL  Final  Blood Culture ID Panel (Reflexed)     Status: Abnormal   Collection Time: 09/24/18  3:55 PM  Result Value Ref Range Status   Enterococcus species NOT DETECTED NOT DETECTED Final   Listeria monocytogenes NOT DETECTED NOT DETECTED Final   Staphylococcus species DETECTED (A) NOT DETECTED Final    Comment: Methicillin (oxacillin) resistant coagulase negative staphylococcus. Possible blood culture contaminant (unless isolated from more than one blood culture draw or clinical case suggests pathogenicity). No antibiotic treatment is indicated for blood  culture contaminants. CRITICAL RESULT CALLED TO, READ BACK BY AND VERIFIED WITH: J [place] PHARMD 09/25/18 1751 JDW    Staphylococcus aureus (BCID) NOT DETECTED NOT DETECTED Final   Methicillin resistance DETECTED (A) NOT DETECTED Final    Comment: CRITICAL RESULT CALLED TO, READ BACK BY AND VERIFIED WITH: J [place] PHARMD 09/25/18 1751 JDW    Streptococcus species NOT DETECTED NOT DETECTED Final   Streptococcus agalactiae NOT DETECTED NOT DETECTED Final   Streptococcus pneumoniae NOT DETECTED NOT DETECTED Final   Streptococcus pyogenes NOT DETECTED NOT DETECTED Final   Acinetobacter baumannii NOT DETECTED NOT DETECTED Final   Enterobacteriaceae species NOT DETECTED NOT DETECTED Final   Enterobacter cloacae complex NOT DETECTED NOT DETECTED Final   Escherichia coli NOT DETECTED NOT DETECTED Final   Klebsiella oxytoca NOT DETECTED NOT DETECTED Final   Klebsiella pneumoniae NOT DETECTED NOT DETECTED Final   Proteus species NOT DETECTED NOT DETECTED Final   Serratia marcescens NOT DETECTED NOT DETECTED Final   Haemophilus influenzae NOT DETECTED NOT DETECTED Final   Neisseria meningitidis NOT DETECTED NOT DETECTED Final   Pseudomonas aeruginosa NOT DETECTED NOT DETECTED Final   Candida albicans NOT DETECTED NOT DETECTED Final   Candida glabrata NOT DETECTED NOT DETECTED Final   Candida krusei  NOT DETECTED NOT DETECTED Final   Candida parapsilosis NOT DETECTED NOT DETECTED Final   Candida tropicalis NOT DETECTED NOT DETECTED Final    Comment: Performed at [facility], [address], [place], [place] [address]  Culture, blood (routine x 2)     Status: None   Collection Time: 09/24/18  3:59 PM  Result Value Ref Range Status   Specimen Description BLOOD HAND  Final   Special Requests   Final    BOTTLES DRAWN AEROBIC ONLY Blood Culture adequate volume   Culture   Final    NO GROWTH 5 DAYS Performed at [facility], [address] [place], [place] [address]    Report Status 09/29/2018 FINAL  Final  Culture, Urine     Status: Abnormal   Collection Time: 09/24/18  4:22  PM  Result Value Ref Range Status   Specimen Description URINE, RANDOM  Final   Special Requests   Final    NONE Performed at [facility], [address], [place], [place] [address]    Culture >=100,000 COLONIES/mL ENTEROCOCCUS FAECALIS (A)  Final   Report Status 09/26/2018 FINAL  Final   Organism ID, Bacteria ENTEROCOCCUS FAECALIS (A)  Final      Susceptibility   Enterococcus faecalis - MIC*    AMPICILLIN <=2 SENSITIVE Sensitive     LEVOFLOXACIN >=8 RESISTANT Resistant     NITROFURANTOIN <=16 SENSITIVE Sensitive     VANCOMYCIN 1 SENSITIVE Sensitive     * >=100,000 COLONIES/mL ENTEROCOCCUS FAECALIS  Culture, Urine     Status: None   Collection Time: 09/28/18  6:53 PM  Result Value Ref Range Status   Specimen Description URINE, CATHETERIZED  Final   Special Requests Immunocompromised  Final   Culture   Final    NO GROWTH Performed at [facility], [address] [place], [place] [address]    Report Status 09/29/2018 FINAL  Final    Anti-infectives:  Anti-infectives (From admission, onward)   Start     Dose/Rate Route Frequency Ordered Stop   09/27/18 0830  ampicillin (OMNIPEN) 2 g in sodium chloride 0.9 % 100 mL IVPB     2 g 300 mL/hr over 20 Minutes Intravenous Every 6  hours 09/27/18 0822     09/25/18 2000  vancomycin (VANCOCIN) 1,250 mg in sodium chloride 0.9 % 250 mL IVPB  Status:  Discontinued     1,250 mg 166.7 mL/hr over 90 Minutes Intravenous Every 12 hours 09/25/18 1854 09/27/18 0822   09/25/18 0800  ceFEPIme (MAXIPIME) 2 g in sodium chloride 0.9 % 100 mL IVPB  Status:  Discontinued     2 g 200 mL/hr over 30 Minutes Intravenous Every 8 hours 09/25/18 0750 09/27/18 0822   09/19/18 1257  polymyxin B 500,000 Units, bacitracin 50,000 Units in sodium chloride 0.9 % 500 mL irrigation  Status:  Discontinued       As needed 09/19/18 1257 09/19/18 1608   09/19/18 1100  ceFAZolin (ANCEF) IVPB 2g/100 mL premix     2 g 200 mL/hr over 30 Minutes Intravenous To Surgery 09/18/18 2055 09/19/18 1334   08/29/18 1512  polymyxin B 500,000 Units, bacitracin 50,000 Units in sodium chloride 0.9 % 500 mL irrigation  Status:  Discontinued       As needed 08/29/18 1512 08/29/18 1721   08/23/18 1200  meropenem (MERREM) 1 g in sodium chloride 0.9 % 100 mL IVPB     1 g 200 mL/hr over 30 Minutes Intravenous Every 8 hours 08/23/18 1122 09/05/18 1311   08/22/18 1400  ceFEPIme (MAXIPIME) 2 g in sodium chloride 0.9 % 100 mL IVPB  Status:  Discontinued     2 g 200 mL/hr over 30 Minutes Intravenous Every 8 hours 08/22/18 0836 08/22/18 1027   08/22/18 1200  meropenem (MERREM) 2 g in sodium chloride 0.9 % 100 mL IVPB  Status:  Discontinued     2 g 200 mL/hr over 30 Minutes Intravenous Every 8 hours 08/22/18 1027 08/23/18 1122   08/21/18 1400  ceFEPIme (MAXIPIME) 1 g in sodium chloride 0.9 % 100 mL IVPB  Status:  Discontinued     1 g 200 mL/hr over 30 Minutes Intravenous Every 8 hours 08/21/18 1042 08/22/18 0836   08/19/18 1030  cefTRIAXone (ROCEPHIN) 2 g in sodium chloride 0.9 %  100 mL IVPB  Status:  Discontinued     2 g 200 mL/hr over 30 Minutes Intravenous Every 24 hours 08/19/18 1017 08/21/18 1042   08/13/18 1400  piperacillin-tazobactam (ZOSYN) IVPB 3.375 g  Status:   Discontinued     3.375 g 12.5 mL/hr over 240 Minutes Intravenous Every 8 hours 08/13/18 0928 08/16/18 0854   08/13/18 1354  polymyxin B 500,000 Units, bacitracin 50,000 Units in sodium chloride 0.9 % 500 mL irrigation  Status:  Discontinued       As needed 08/13/18 1354 08/13/18 1538   08/13/18 0930  piperacillin-tazobactam (ZOSYN) IVPB 3.375 g     3.375 g 100 mL/hr over 30 Minutes Intravenous  Once 08/13/18 0928 08/13/18 1200   08/06/18 0801  polymyxin B 500,000 Units, bacitracin 50,000 Units in sodium chloride 0.9 % 500 mL irrigation  Status:  Discontinued       As needed 08/06/18 0803 08/06/18 0951   08/06/18 0600  ceFAZolin (ANCEF) IVPB 2g/100 mL premix  Status:  Discontinued     2 g 200 mL/hr over 30 Minutes Intravenous On call to O.R. 08/05/18 2241 08/05/18 2241   08/06/18 0600  ceFAZolin (ANCEF) IVPB 2g/100 mL premix  Status:  Discontinued     2 g 200 mL/hr over 30 Minutes Intravenous To Short Stay 08/05/18 2241 08/06/18 1024   08/01/18 2200  Ampicillin-Sulbactam (UNASYN) 3 g in sodium chloride 0.9 % 100 mL IVPB     3 g 200 mL/hr over 30 Minutes Intravenous Every 6 hours 08/01/18 2146 08/08/18 2235   08/01/18 1630  piperacillin-tazobactam (ZOSYN) IVPB 3.375 g  Status:  Discontinued     3.375 g 12.5 mL/hr over 240 Minutes Intravenous Every 8 hours 08/01/18 1624 08/01/18 2139   07/25/18 1508  polymyxin B 500,000 Units, bacitracin 50,000 Units in sodium chloride 0.9 % 500 mL irrigation  Status:  Discontinued       As needed 07/25/18 1509 07/25/18 1750   07/25/18 1445  piperacillin-tazobactam (ZOSYN) IVPB 3.375 g     3.375 g 100 mL/hr over 30 Minutes Intravenous STAT 07/25/18 1443 07/25/18 1620   07/25/18 0600  ceFAZolin (ANCEF) 3 g in dextrose 5 % 50 mL IVPB  Status:  Discontinued     3 g 100 mL/hr over 30 Minutes Intravenous To ShortStay Surgical 07/24/18 1735 07/25/18 1753   07/18/18 1444  polymyxin B 500,000 Units, bacitracin 50,000 Units in sodium chloride 0.9 % 500 mL  irrigation  Status:  Discontinued       As needed 07/18/18 1445 07/18/18 1738   07/17/18 0900  piperacillin-tazobactam (ZOSYN) IVPB 3.375 g  Status:  Discontinued     3.375 g 12.5 mL/hr over 240 Minutes Intravenous Every 8 hours 07/17/18 0810 07/27/18 0806   07/12/18 1430  metronidazole (FLAGYL) IVPB 500 mg  Status:  Discontinued     500 mg 100 mL/hr over 60 Minutes Intravenous To Surgery 07/12/18 1427 07/12/18 1608   07/12/18 1430  cefTRIAXone (ROCEPHIN) 2 g in sodium chloride 0.9 % 100 mL IVPB  Status:  Discontinued     2 g 200 mL/hr over 30 Minutes Intravenous To Surgery 07/12/18 1427 07/12/18 1608   07/12/18 1245  tobramycin (NEBCIN) powder  Status:  Discontinued       As needed 07/12/18 1245 07/12/18 1542   07/12/18 1243  vancomycin (VANCOCIN) powder  Status:  Discontinued       As needed 07/12/18 1244 07/12/18 1542   07/11/18 1330  cefTRIAXone (ROCEPHIN) 2 g in sodium  chloride 0.9 % 100 mL IVPB  Status:  Discontinued     2 g 200 mL/hr over 30 Minutes Intravenous Every 24 hours 07/11/18 1259 07/17/18 0810   07/11/18 1300  metroNIDAZOLE (FLAGYL) IVPB 500 mg  Status:  Discontinued     500 mg 100 mL/hr over 60 Minutes Intravenous Every 8 hours 07/11/18 1259 07/17/18 0810   07/11/18 0400  ceFAZolin (ANCEF) IVPB 2g/100 mL premix  Status:  Discontinued     2 g 200 mL/hr over 30 Minutes Intravenous Every 8 hours 07/10/18 2109 07/11/18 1259   07/10/18 1815  ceFAZolin (ANCEF) IVPB 2g/100 mL premix  Status:  Discontinued     2 g 200 mL/hr over 30 Minutes Intravenous  Once 07/10/18 1814 07/10/18 2339      Best Practice/Protocols:  VTE Prophylaxis: Lovenox (prophylaxtic dose) Intermittent Sedation  Consults: Treatment Team:  Md, Trauma, MD Haddix, Gillie Manners, MD Dillingham, Alena Bills, DO    Studies:    Events:  Subjective:    Overnight Issues:   Objective:  Vital signs for last 24 hours: Temp:  [98.3 F (36.8 C)-101.1 F (38.4 C)] 98.3 F (36.8 C) (04/21 0400) Pulse  Rate:  [93-129] 108 (04/21 0700) Resp:  [18-35] 19 (04/21 0700) BP: (92-129)/(59-74) 104/66 (04/21 0700) SpO2:  [96 %-100 %] 100 % (04/21 0700) FiO2 (%):  [30 %] 30 % (04/21 0400) Weight:  [64.5 kg] 64.5 kg (04/21 0500)  Hemodynamic parameters for last 24 hours:    Intake/Output from previous day: 04/20 0701 - 04/21 0700 In: 5596.3 [ZO/XW:9604; IV Piggyback:491.3] Out: 2875 [Urine:2575; Stool:300]  Intake/Output this shift: No intake/output data recorded.  Vent settings for last 24 hours: Vent Mode: PRVC FiO2 (%):  [30 %] 30 % Set Rate:  [18 bmp] 18 bmp Vt Set:  [650 mL] 650 mL PEEP:  [5 cmH20] 5 cmH20 Pressure Support:  [10 cmH20] 10 cmH20 Plateau Pressure:  [15 cmH20-22 cmH20] 19 cmH20  Physical Exam:  General: on vent Neuro: awakens and F/C HEENT/Neck: trach-clean, intact Resp: clear to auscultation bilaterally CVS: RRR GI: soft, PEG. stoma output good, SP tube Extremities: edema 1+  Results for orders placed or performed during the hospital encounter of 07/10/18 (from the past 24 hour(s))  Glucose, capillary     Status: Abnormal   Collection Time: 10/01/18  7:46 AM  Result Value Ref Range   Glucose-Capillary 104 (H) 70 - 99 mg/dL   Comment 1 Notify RN    Comment 2 Document in Chart   Glucose, capillary     Status: Abnormal   Collection Time: 10/01/18 11:39 AM  Result Value Ref Range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

## 2018-10-02 NOTE — Progress Notes (Signed)
OT Cancellation Note  Patient Details Name: Christopher [name] MRN: [id number] DOB: 1966/05/03   Cancelled Treatment:    Reason Eval/Treat Not Completed: Medical issues which prohibited therapy.  Pt with sustained HR 139-131 while at rest in supine position.   RN preparing to perform dressing change and pt will have Ativan and pain meds will reduce his ability to participate later.   Will reattempt.  Jeani Hawking, OTR/L Acute Rehabilitation Services Pager [phone] Office [phone]   Jeani Hawking M 10/02/2018, 3:05 PM

## 2018-10-02 NOTE — Progress Notes (Signed)
PT Cancellation Note  Patient Details Name: Christopher [name] MRN: [id number] DOB: May 07, 1966   Cancelled Treatment:    Reason Eval/Treat Not Completed: Medical issues which prohibited therapy Pt's HR sustaining at 128-131 bpm at rest. Not safe for PT at this time. RN about to give ativan and pain meds for dressing change. Will follow.   Blake Divine A Carlas Vandyne 10/02/2018, 2:29 PM Mylo Red, PT, DPT Acute Rehabilitation Services Pager [phone] Office [phone]
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
## 2018-10-03 ENCOUNTER — Inpatient Hospital Stay (HOSPITAL_COMMUNITY): Payer: No Typology Code available for payment source | Admitting: Registered Nurse

## 2018-10-03 ENCOUNTER — Encounter (HOSPITAL_COMMUNITY)
Admission: EM | Disposition: A | Discharge status: Nursing Home (Includes ICF) | Payer: Self-pay | Source: Home / Self Care

## 2018-10-03 DIAGNOSIS — S31819A Unspecified open wound of right buttock, initial encounter: Secondary | ICD-10-CM

## 2018-10-03 DIAGNOSIS — S31829A Unspecified open wound of left buttock, initial encounter: Secondary | ICD-10-CM

## 2018-10-03 DIAGNOSIS — S71102A Unspecified open wound, left thigh, initial encounter: Secondary | ICD-10-CM

## 2018-10-03 HISTORY — PX: SKIN SPLIT GRAFT: SHX444

## 2018-10-03 LAB — GLUCOSE, CAPILLARY
Glucose-Capillary: 74 mg/dL (ref 70–99)
Glucose-Capillary: 94 mg/dL (ref 70–99)
Glucose-Capillary: 102 mg/dL — ABNORMAL HIGH (ref 70–99)
Glucose-Capillary: 198 mg/dL — ABNORMAL HIGH (ref 70–99)
Glucose-Capillary: 219 mg/dL — ABNORMAL HIGH (ref 70–99)

## 2018-10-03 LAB — BASIC METABOLIC PANEL
Anion gap: 10 (ref 5–15)
BUN: 32 mg/dL — ABNORMAL HIGH (ref 6–20)
CO2: 26 mmol/L (ref 22–32)
Calcium: 10 mg/dL (ref 8.9–10.3)
Chloride: 115 mmol/L — ABNORMAL HIGH (ref 98–111)
Creatinine, Ser: 0.49 mg/dL — ABNORMAL LOW (ref 0.61–1.24)
GFR calc Af Amer: >60 mL/min (ref >60)
GFR calc non Af Amer: >60 mL/min (ref >60)
Glucose, Bld: 111 mg/dL — ABNORMAL HIGH (ref 70–99)
Potassium: 3.6 mmol/L (ref 3.5–5.1)
Sodium: 151 mmol/L — ABNORMAL HIGH (ref 135–145)

## 2018-10-03 LAB — PREPARE RBC (CROSSMATCH)

## 2018-10-03 SURGERY — APPLICATION, GRAFT, SKIN, SPLIT-THICKNESS
Anesthesia: General | Site: Buttocks

## 2018-10-03 MED ORDER — HYDROMORPHONE HCL 1 MG/ML IJ SOLN
INTRAMUSCULAR | Status: AC
  Filled 2018-10-03: qty 1

## 2018-10-03 MED ORDER — PHENYLEPHRINE 40 MCG/ML (10ML) SYRINGE FOR IV PUSH (FOR BLOOD PRESSURE SUPPORT)
PREFILLED_SYRINGE | INTRAVENOUS | Status: AC
  Filled 2018-10-03: qty 40

## 2018-10-03 MED ORDER — HYDROMORPHONE HCL 1 MG/ML IJ SOLN
INTRAMUSCULAR | Status: AC
  Filled 2018-10-03: qty 0.5

## 2018-10-03 MED ORDER — SODIUM CHLORIDE 0.9 % IV SOLN
INTRAVENOUS | Status: DC | PRN
  Administered 2018-10-03: 25 ug/min via INTRAVENOUS

## 2018-10-03 MED ORDER — EVICEL 5 ML EX KIT
PACK | CUTANEOUS | Status: AC
  Filled 2018-10-03: qty 1

## 2018-10-03 MED ORDER — 0.9 % SODIUM CHLORIDE (POUR BTL) OPTIME
TOPICAL | Status: DC | PRN
  Administered 2018-10-03: 1000 mL

## 2018-10-03 MED ORDER — BUPIVACAINE-EPINEPHRINE (PF) 0.25% -1:200000 IJ SOLN
INTRAMUSCULAR | Status: AC
  Filled 2018-10-03: qty 30

## 2018-10-03 MED ORDER — BUPIVACAINE-EPINEPHRINE 0.25% -1:200000 IJ SOLN
INTRAMUSCULAR | Status: DC | PRN
  Administered 2018-10-03: 30 mL

## 2018-10-03 MED ORDER — VECURONIUM BROMIDE 10 MG IV SOLR
INTRAVENOUS | Status: DC | PRN
  Administered 2018-10-03 (×2): 5 mg via INTRAVENOUS

## 2018-10-03 MED ORDER — SODIUM CHLORIDE (PF) 0.9 % IJ SOLN
INTRAMUSCULAR | Status: AC
  Filled 2018-10-03: qty 10

## 2018-10-03 MED ORDER — LACTATED RINGERS IV SOLN
INTRAVENOUS | Status: DC | PRN
  Administered 2018-10-03 (×2): via INTRAVENOUS

## 2018-10-03 MED ORDER — SODIUM CHLORIDE 0.9 % IV SOLN: 10.0000 mL/h | Freq: Once | INTRAVENOUS | Status: DC

## 2018-10-03 MED ORDER — MIDAZOLAM HCL 2 MG/2ML IJ SOLN
INTRAMUSCULAR | Status: AC
  Filled 2018-10-03: qty 2

## 2018-10-03 MED ORDER — FENTANYL CITRATE (PF) 250 MCG/5ML IJ SOLN
INTRAMUSCULAR | Status: AC
  Filled 2018-10-03: qty 5

## 2018-10-03 MED ORDER — SODIUM CHLORIDE (PF) 0.9 % IJ SOLN
INTRAMUSCULAR | Status: DC | PRN
  Administered 2018-10-03: 50 mL

## 2018-10-03 MED ORDER — ROCURONIUM BROMIDE 10 MG/ML (PF) SYRINGE
PREFILLED_SYRINGE | INTRAVENOUS | Status: DC | PRN
  Administered 2018-10-03 (×2): 50 mg via INTRAVENOUS

## 2018-10-03 MED ORDER — ALBUMIN HUMAN 5 % IV SOLN
INTRAVENOUS | Status: DC | PRN
  Administered 2018-10-03 (×2): via INTRAVENOUS

## 2018-10-03 MED ORDER — VECURONIUM BROMIDE 10 MG IV SOLR
INTRAVENOUS | Status: AC
  Filled 2018-10-03: qty 10

## 2018-10-03 MED ORDER — MIDAZOLAM HCL (PF) 10 MG/2ML IJ SOLN
INTRAMUSCULAR | Status: AC
  Filled 2018-10-03: qty 2

## 2018-10-03 MED ORDER — HYDROMORPHONE HCL 1 MG/ML IJ SOLN
INTRAMUSCULAR | Status: DC | PRN
  Administered 2018-10-03 (×3): 0.5 mg via INTRAVENOUS
  Administered 2018-10-03: 1 mg via INTRAVENOUS
  Administered 2018-10-03: 0.5 mg via INTRAVENOUS
  Administered 2018-10-03: 1 mg via INTRAVENOUS

## 2018-10-03 MED ORDER — EPINEPHRINE PF 1 MG/ML IJ SOLN
INTRAMUSCULAR | Status: DC | PRN
  Administered 2018-10-03: 1 mg

## 2018-10-03 MED ORDER — SODIUM CHLORIDE 0.9 % IV SOLN
INTRAVENOUS | Status: DC | PRN
  Administered 2018-10-03 (×2): via INTRAVENOUS

## 2018-10-03 MED ORDER — MIDAZOLAM HCL 5 MG/5ML IJ SOLN
INTRAMUSCULAR | Status: DC | PRN
  Administered 2018-10-03: 2 mg via INTRAVENOUS

## 2018-10-03 MED ORDER — FENTANYL CITRATE (PF) 250 MCG/5ML IJ SOLN
INTRAMUSCULAR | Status: DC | PRN
  Administered 2018-10-03: 50 ug via INTRAVENOUS
  Administered 2018-10-03 (×4): 100 ug via INTRAVENOUS
  Administered 2018-10-03: 50 ug via INTRAVENOUS

## 2018-10-03 MED ORDER — EPINEPHRINE PF 1 MG/ML IJ SOLN
INTRAMUSCULAR | Status: AC
  Filled 2018-10-03: qty 1

## 2018-10-03 MED ORDER — EVICEL 5 ML EX KIT
PACK | CUTANEOUS | Status: DC | PRN
  Administered 2018-10-03: 1
  Administered 2018-10-03: 5 mL

## 2018-10-03 MED ORDER — DEXAMETHASONE SODIUM PHOSPHATE 10 MG/ML IJ SOLN
INTRAMUSCULAR | Status: DC | PRN
  Administered 2018-10-03: 4 mg via INTRAVENOUS

## 2018-10-03 MED ORDER — SODIUM CHLORIDE 0.9 % IV SOLN
INTRAVENOUS | Status: AC
  Filled 2018-10-03: qty 500000

## 2018-10-03 SURGICAL SUPPLY — 71 items
BANDAGE ACE 4X5 VEL STRL LF (GAUZE/BANDAGES/DRESSINGS) IMPLANT
BANDAGE ACE 6X5 VEL STRL LF (GAUZE/BANDAGES/DRESSINGS) IMPLANT
BENZOIN TINCTURE PRP APPL 2/3 (GAUZE/BANDAGES/DRESSINGS) ×4 IMPLANT
BLADE 10 SAFETY STRL DISP (BLADE) ×2 IMPLANT
BLADE CLIPPER SURG (BLADE) IMPLANT
BLADE DERMATOME SS (BLADE) ×4 IMPLANT
BNDG GAUZE ELAST 4 BULKY (GAUZE/BANDAGES/DRESSINGS) ×5 IMPLANT
CANISTER SUCT 3000ML PPV (MISCELLANEOUS) IMPLANT
CANISTER WOUND CARE 500ML ATS (WOUND CARE) IMPLANT
COVER SURGICAL LIGHT HANDLE (MISCELLANEOUS) ×2 IMPLANT
COVER WAND RF STERILE (DRAPES) ×2 IMPLANT
DERMACARRIERS GRAFT 1 TO 1.5 (DISPOSABLE) ×6
DRAPE HALF SHEET 40X57 (DRAPES) ×2 IMPLANT
DRAPE INCISE IOBAN 66X45 STRL (DRAPES) ×2 IMPLANT
DRAPE ORTHO SPLIT 77X108 STRL (DRAPES) ×2
DRAPE SURG ORHT 6 SPLT 77X108 (DRAPES) ×2 IMPLANT
DRESSING HYDROCOLLOID 4X4 (GAUZE/BANDAGES/DRESSINGS) ×1 IMPLANT
DRSG ADAPTIC 3X8 NADH LF (GAUZE/BANDAGES/DRESSINGS) ×8 IMPLANT
DRSG CUTIMED SORBACT 7X9 (GAUZE/BANDAGES/DRESSINGS) ×4 IMPLANT
DRSG OPSITE 6X11 MED (GAUZE/BANDAGES/DRESSINGS) IMPLANT
DRSG PAD ABDOMINAL 8X10 ST (GAUZE/BANDAGES/DRESSINGS) ×7 IMPLANT
DRSG TELFA 3X8 NADH (GAUZE/BANDAGES/DRESSINGS) ×4 IMPLANT
DRSG VAC ATS LRG SENSATRAC (GAUZE/BANDAGES/DRESSINGS) IMPLANT
DRSG VAC ATS MED SENSATRAC (GAUZE/BANDAGES/DRESSINGS) IMPLANT
DRSG VAC ATS SM SENSATRAC (GAUZE/BANDAGES/DRESSINGS) IMPLANT
ELECT REM PT RETURN 9FT ADLT (ELECTROSURGICAL)
ELECTRODE REM PT RTRN 9FT ADLT (ELECTROSURGICAL) IMPLANT
FILTER STRAW FLUID ASPIR (MISCELLANEOUS) ×2 IMPLANT
GAUZE SPONGE 4X4 12PLY STRL (GAUZE/BANDAGES/DRESSINGS) ×2 IMPLANT
GAUZE SPONGE 4X4 12PLY STRL LF (GAUZE/BANDAGES/DRESSINGS) ×1 IMPLANT
GAUZE XEROFORM 5X9 LF (GAUZE/BANDAGES/DRESSINGS) ×4 IMPLANT
GEL ULTRASOUND 20GR AQUASONIC (MISCELLANEOUS) IMPLANT
GLOVE BIO SURGEON STRL SZ 6.5 (GLOVE) ×4 IMPLANT
GOWN STRL REUS W/ TWL LRG LVL3 (GOWN DISPOSABLE) ×2 IMPLANT
GOWN STRL REUS W/TWL LRG LVL3 (GOWN DISPOSABLE) ×2
GRAFT DERMACARRIERS 1 TO 1.5 (DISPOSABLE) ×1 IMPLANT
HANDPIECE INTERPULSE COAX TIP (DISPOSABLE)
KIT BASIN OR (CUSTOM PROCEDURE TRAY) ×2 IMPLANT
KIT TURNOVER KIT B (KITS) ×2 IMPLANT
MATRIX SURG PERF 3LAYER 16X35 (Tissue) ×1 IMPLANT
MATRIX WOUND 3-LAYER 10X15 (Tissue) ×2 IMPLANT
MATRIX WOUND 3-LAYER 5X5 (Tissue) ×2 IMPLANT
MATRIX WOUND 3-LAYER 7X10 (Tissue) ×1 IMPLANT
MICROMATRIX 1000MG (Tissue) ×2 IMPLANT
NDL SPNL 18GX3.5 QUINCKE PK (NEEDLE) ×1 IMPLANT
NDL SPNL 25GX3.5 QUINCKE BL (NEEDLE) IMPLANT
NEEDLE SPNL 18GX3.5 QUINCKE PK (NEEDLE) ×4 IMPLANT
NEEDLE SPNL 25GX3.5 QUINCKE BL (NEEDLE) ×2 IMPLANT
NS IRRIG 1000ML POUR BTL (IV SOLUTION) ×2 IMPLANT
PACK GENERAL/GYN (CUSTOM PROCEDURE TRAY) ×2 IMPLANT
PAD ARMBOARD 7.5X6 YLW CONV (MISCELLANEOUS) ×4 IMPLANT
PAD DRESSING TELFA 3X8 NADH (GAUZE/BANDAGES/DRESSINGS) ×2 IMPLANT
SET HNDPC FAN SPRY TIP SCT (DISPOSABLE) IMPLANT
SOLUTION PARTIC MCRMTRX 1000MG (Tissue) IMPLANT
STAPLER VISISTAT 35W (STAPLE) ×2 IMPLANT
SURGILUBE 2OZ TUBE FLIPTOP (MISCELLANEOUS) ×5 IMPLANT
SUT CHROMIC 4 0 PS 2 18 (SUTURE) IMPLANT
SUT MNCRL AB 3-0 PS2 18 (SUTURE) ×3 IMPLANT
SUT SILK 4 0 PS 2 (SUTURE) IMPLANT
SUT VIC AB 4-0 PS2 18 (SUTURE) ×1 IMPLANT
SUT VIC AB 4-0 PS2 27 (SUTURE) ×3 IMPLANT
SUT VIC AB 5-0 P-3 18X BRD (SUTURE) IMPLANT
SUT VIC AB 5-0 P-3 18XBRD (SUTURE) IMPLANT
SUT VIC AB 5-0 P3 18 (SUTURE) ×4
SUT VIC AB 5-0 PS2 18 (SUTURE) ×25 IMPLANT
SYR 3ML 25GX5/8 SAFETY (SYRINGE) ×2 IMPLANT
SYR CONTROL 10ML LL (SYRINGE) ×3 IMPLANT
TAPE CLOTH SURG 4X10 WHT LF (GAUZE/BANDAGES/DRESSINGS) ×2 IMPLANT
TOWEL OR 17X24 6PK STRL BLUE (TOWEL DISPOSABLE) ×2 IMPLANT
TOWEL OR 17X26 10 PK STRL BLUE (TOWEL DISPOSABLE) ×2 IMPLANT
UNDERPAD 30X30 (UNDERPADS AND DIAPERS) ×2 IMPLANT

## 2018-10-03 NOTE — Anesthesia Procedure Notes (Signed)
Date/Time: 10/03/2018 12:49 PM Performed by: Zollie Scale, CRNA Pre-anesthesia Checklist: Patient identified, Emergency Drugs available, Suction available and Patient being monitored Patient Re-evaluated:Patient Re-evaluated prior to induction Oxygen Delivery Method: Circle system utilized Preoxygenation: Pre-oxygenation with 100% oxygen Induction Type: Tracheostomy

## 2018-10-03 NOTE — Op Note (Signed)
DATE OF OPERATION: 10/03/2018  LOCATION: [facility] Main Operating Room  PREOPERATIVE DIAGNOSIS: wound of left leg and gluteal area  POSTOPERATIVE DIAGNOSIS: Same  PROCEDURE:  1. Split thickness skin graft to gluteal area 16 x 25 cm 2. Split thickness skin graft to left lateral and posterior leg 25 x 35 3. Acell placement to iliac bone 1 gm powder and 7 x 10 cm sheet  4. Acell placement to back donor site (sheet 10 x 15 cm (two) and 16 x 35 cm)  SURGEON: Gage Treiber Sanger Ulises Wolfinger, DO  ASSISTANT: Bonita Cox, RNFA  EBL: 150 cc  CONDITION: Stable  COMPLICATIONS: None  INDICATION: The patient, Christopher [name], is a 53 y.o. male born on 1965/08/21, is here for treatment of a severe wound on his left leg and gluteal area.   PROCEDURE DETAILS:  The patient was seen prior to surgery and marked.  The IV antibiotics were given. The patient was taken to the operating room and given a general anesthetic. A standard time out was performed and all information was confirmed by those in the room. SCDs were placed.   The patient was placed in the right lateral position.  The patient's back skin was used for the donor skin with an area of 26 x 35 cm.  The back was covered with the Acell (16 x 35 cm sheet and two 10 x 15 cm sheet).  The back was then covered with sorbact, [place], kerlex and ABDs.  The skin was meshed 1:1.5 and placed on the gluteal area (16 x 25 cm) and left (25 x 35 cm).  The graft was secured with the fibrin glue and 5-0 Vicryl sutures. The posterior leg had a 5 x 5 cm area that was covered with the Acell and the Sorbact.  Both were secured with the 5-0 Vicryl.  The sorbact was covered with the [facility] and gauze.  The iliac bone was covered with the 1 gm of powder and 7 x 10 cm sheet.  The leg was covered with the adaptic.  The previously grafted site was covered with the xeroform.  ABDs and Kerlex was used to cover the leg.  The patient was allowed to wake up and taken to recovery room in stable  condition at the end of the case. The family was notified at the end of the case.   The RNFA assisted throughout the case.  The RNFA was essential in retraction and counter traction when needed to make the case progress smoothly.  This retraction and assistance made it possible to see the tissue plans for the procedure.  The assistance was needed for blood control, tissue re-approximation and assisted with closure of the incision site.

## 2018-10-03 NOTE — Transfer of Care (Signed)
Immediate Anesthesia Transfer of Care Note  Patient: Christopher [name]  Procedure(s) Performed: Split thickness skin graft to gluteal area with acell placement (N/A Buttocks)  Patient Location: ICU  Anesthesia Type:General  Level of Consciousness: sedated  Airway & Oxygen Therapy: Patient placed on Ventilator (see vital sign flow sheet for setting), RT at bedside upon arrival - placed on PRVC, 650 Vt, 18RR, 5PEEP, 30%FiO2, breath sounds clear and bilateral, confirmed ventilation at bedside. Tracheostomy site WNL.   Post-op Assessment: Report given to RN and Post -op Vital signs reviewed and stable  Post vital signs: Reviewed and stable  Last Vitals:  Vitals Value Taken Time  BP 133/95 10/03/2018  4:33 PM  Temp    Pulse 116 10/03/2018  4:39 PM  Resp 21 10/03/2018  4:39 PM  SpO2 100 % 10/03/2018  4:39 PM  Vitals shown include unvalidated device data.  Last Pain:  Vitals:   10/03/18 0800  TempSrc: Axillary  PainSc:      Full report given to Susannah RN at bedside, informed RN of left upper arm fentanyl patch that was present pre-operatively. VSS. Patient reapplied to ventilator via RT, informed RN of right lateral position needs per Dillingham DO for ~ 5 days.    Complications: No apparent anesthesia complications
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
## 2018-10-03 NOTE — Progress Notes (Signed)
Ortho Trauma Progress Note  Evaluated patient intraoperatively prior to Dr. Kittie Plater case. Trans-sacral transiliac screw had backed out a small amount. I localized the cannulation with a 2.58mm K-wire and used a screw driver and screw still had good purchase. I tightened the screw and allowed Dr. Ulice Bold to proceed with her procedure. I do not feel the screw should come out at this point. I would like to wait at least 4 months postoperatively to allow sufficient healing. Although the screw has been exposed I do not feel there are any other good fixation options and will plan to remove screw at later date once more healing has occurred with his pelvis. I would recommend repeat x-rays in 6-8 weeks and continue WBAT.  Roby Lofts, MD [facility] [phone] (phone) [phone] (office) [email or web address]
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
## 2018-10-03 NOTE — Progress Notes (Addendum)
Patient ID: Christopher [name], male   DOB: 19-Mar-1966, 53 y.o.   MRN: [id number] Follow up - Trauma Critical Care  Patient Details:    Christopher [name] is an 53 y.o. male.  Lines/tubes : PICC Double Lumen 08/10/18 PICC Left Brachial 50 cm 1 cm (Active)  Indication for Insertion or Continuance of Line Prolonged intravenous therapies 10/02/2018  8:00 PM  Exposed Catheter (cm) 1 cm 08/10/2018  3:39 PM  Site Assessment Clean;Dry;Intact 10/02/2018  8:00 PM  Lumen #1 Status Blood return noted;Capped (Central line);Flushed 10/02/2018  8:00 PM  Lumen #2 Status In-line blood sampling system in place;Blood return noted;Flushed;Infusing 10/02/2018  8:00 PM  Dressing Type Transparent;Occlusive 10/02/2018  8:00 PM  Dressing Status Antimicrobial disc in place;Intact;Dry;Clean 10/02/2018  8:00 PM  Line Care Connections checked and tightened 10/02/2018  8:00 PM  Line Adjustment (NICU/IV Team Only) No 09/18/2018  8:00 PM  Dressing Intervention New dressing;Dressing changed;Antimicrobial disc changed;Securement device changed 09/28/2018  5:00 PM  Dressing Change Due 10/05/18 10/02/2018  8:00 PM     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)  Surrounding Skin Dry;Intact 10/02/2018  8:00 PM  Tube Status Patent 10/02/2018  8:00 PM  Drainage Appearance None 10/01/2018  4:00 AM  Dressing Status Clean;Intact;Dry 10/02/2018  8:00 PM  Dressing Intervention Dressing changed 09/27/2018  8:00 PM  Dressing Change Due 09/16/18 09/27/2018  8:00 PM  G Port Intake (mL) 50 ml 10/03/2018 12:00 AM  Output (mL) 75 mL 09/06/2018  6:00 PM     Colostomy LLQ (Active)  Ostomy Pouch 2 piece 10/02/2018  8:00 PM  Stoma Assessment Pink 10/02/2018  8:00 PM  Peristomal Assessment Intact 10/02/2018  8:00 PM  Treatment Pouch change 10/02/2018  1:00 AM  Output (mL) 150 mL 10/03/2018  5:00 AM     Suprapubic Catheter Non-latex 14 Fr. 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 It is not intended to diagnose MRSA infection nor to guide or monitor treatment for MRSA infections. Performed at [facility], [address], [place], [place] [address]   Surgical pcr screen     Status: None   Collection Time: 07/12/18  8:11 AM  Result Value Ref Range Status   MRSA, PCR NEGATIVE NEGATIVE Final   Staphylococcus aureus NEGATIVE NEGATIVE Final    Comment: (NOTE) The Xpert SA Assay (FDA approved for NASAL specimens in patients 49 years of age and older), is one component of a comprehensive surveillance program. It is not intended to diagnose infection nor to guide or monitor treatment. Performed at [facility], [address], [place], [place] [address]   Culture, blood (  Routine X 2) w Reflex to ID Panel     Status: None   Collection Time: 07/17/18  8:40 AM  Result Value Ref Range Status   Specimen Description BLOOD RIGHT ARM  Final   Special Requests   Final    BOTTLES DRAWN AEROBIC AND ANAEROBIC Blood Culture adequate volume   Culture   Final    NO GROWTH 5 DAYS Performed at [facility], [address] [place], [place] [address]    Report Status 07/22/2018 FINAL   Final  Culture, blood (Routine X 2) w Reflex to ID Panel     Status: None   Collection Time: 07/17/18  8:52 AM  Result Value Ref Range Status   Specimen Description BLOOD RIGHT HAND  Final   Special Requests   Final    BOTTLES DRAWN AEROBIC ONLY Blood Culture adequate volume   Culture   Final    NO GROWTH 5 DAYS Performed at [facility], [address], [place], [place] [address]    Report Status 07/22/2018 FINAL  Final  Culture, blood (Routine X 2) w Reflex to ID Panel     Status: None   Collection Time: 07/30/18  8:50 AM  Result Value Ref Range Status   Specimen Description BLOOD LEFT HAND  Final   Special Requests   Final    BOTTLES DRAWN AEROBIC ONLY Blood Culture results may not be optimal due to an inadequate volume of blood received in culture bottles Performed at [facility], [address] [place], [place] [address]    Culture NO GROWTH 5 DAYS  Final   Report Status 08/04/2018 FINAL  Final  Culture, blood (Routine X 2) w Reflex to ID Panel     Status: None   Collection Time: 07/30/18  9:56 AM  Result Value Ref Range Status   Specimen Description BLOOD LEFT ARM  Final   Special Requests   Final    BOTTLES DRAWN AEROBIC ONLY Blood Culture results may not be optimal due to an inadequate volume of blood received in culture bottles Performed at [facility], [address], [place], [place] [address]    Culture NO GROWTH 5 DAYS  Final   Report Status 08/04/2018 FINAL  Final  Culture, respiratory (non-expectorated)     Status: None   Collection Time: 07/30/18 11:38 AM  Result Value Ref Range Status   Specimen Description TRACHEAL ASPIRATE  Final   Special Requests Normal  Final   Gram Stain   Final    RARE WBC PRESENT, PREDOMINANTLY PMN RARE GRAM POSITIVE COCCI Performed at [facility], [address] [place], [place] [address]    Culture   Final    MODERATE ACINETOBACTER CALCOACETICUS/BAUMANNII COMPLEX   Report Status 08/01/2018 FINAL   Final   Organism ID, Bacteria ACINETOBACTER CALCOACETICUS/BAUMANNII COMPLEX  Final      Susceptibility   Acinetobacter calcoaceticus/baumannii complex - MIC*    CEFTAZIDIME 8 SENSITIVE Sensitive     CEFTRIAXONE 32 INTERMEDIATE Intermediate     CIPROFLOXACIN <=0.25 SENSITIVE Sensitive     GENTAMICIN <=1 SENSITIVE Sensitive     IMIPENEM <=0.25 SENSITIVE Sensitive     PIP/TAZO <=4 SENSITIVE Sensitive     TRIMETH/SULFA <=20 SENSITIVE Sensitive     CEFEPIME 4 SENSITIVE Sensitive     AMPICILLIN/SULBACTAM <=2 SENSITIVE Sensitive     * MODERATE ACINETOBACTER CALCOACETICUS/BAUMANNII COMPLEX  Culture, respiratory (non-expectorated)     Status: None   Collection  Time: 08/13/18  9:22 AM  Result Value Ref Range Status   Specimen Description TRACHEAL ASPIRATE  Final   Special Requests Normal  Final   Gram Stain   Final    FEW WBC PRESENT, PREDOMINANTLY PMN MODERATE GRAM POSITIVE COCCI MODERATE GRAM NEGATIVE RODS    Culture   Final    Consistent with normal respiratory flora. Performed at [facility], [address], [place], [place] [address]    Report Status 08/15/2018 FINAL  Final  Culture, blood (Routine X 2) w Reflex to ID Panel     Status: None   Collection Time: 08/13/18  4:23 PM  Result Value Ref Range Status   Specimen Description BLOOD FOOT  Final   Special Requests   Final    BOTTLES DRAWN AEROBIC ONLY Blood Culture results may not be optimal due to an inadequate volume of blood received in culture bottles   Culture   Final    NO GROWTH 5 DAYS Performed at [name], [address] [place], [place] [address]    Report Status 08/18/2018 FINAL  Final  Culture, blood (Routine X 2) w Reflex to ID Panel     Status: None   Collection Time: 08/13/18  4:41 PM  Result Value Ref Range Status   Specimen Description BLOOD FOOT  Final   Special Requests   Final    BOTTLES DRAWN AEROBIC ONLY Blood Culture results may not be optimal due to an inadequate volume of blood  received in culture bottles   Culture   Final    NO GROWTH 5 DAYS Performed at [facility], [address] [place], [place] [address]    Report Status 08/18/2018 FINAL  Final  Surgical pcr screen     Status: None   Collection Time: 08/16/18  4:03 AM  Result Value Ref Range Status   MRSA, PCR NEGATIVE NEGATIVE Final   Staphylococcus aureus NEGATIVE NEGATIVE Final    Comment: (NOTE) The Xpert SA Assay (FDA approved for NASAL specimens in patients 52 years of age and older), is one component of a comprehensive surveillance program. It is not intended to diagnose infection nor to guide or monitor treatment. Performed at [facility], [address] [place], [place] [address]   Culture, blood (routine x 2)     Status: Abnormal   Collection Time: 08/18/18 11:50 AM  Result Value Ref Range Status   Specimen Description BLOOD RIGHT HAND  Final   Special Requests   Final    BOTTLES DRAWN AEROBIC ONLY Blood Culture adequate volume   Culture  Setup Time   Final    GRAM NEGATIVE RODS AEROBIC BOTTLE ONLY CRITICAL RESULT CALLED TO, READ BACK BY AND VERIFIED WITH: V. BRYK,PHARMD [id number] 08/19/2018 T. TYSOR    Culture (A)  Final    SERRATIA MARCESCENS PSEUDOMONAS PUTIDA CRITICAL RESULT CALLED TO, READ BACK BY AND VERIFIED WITH: PHARMD M LORI [id number] AT 757 AM BY CM Performed at [facility], [address] [place], [place] [address]    Report Status 08/22/2018 FINAL  Final   Organism ID, Bacteria SERRATIA MARCESCENS  Final   Organism ID, Bacteria PSEUDOMONAS PUTIDA  Final      Susceptibility   Pseudomonas putida - MIC*    CEFTAZIDIME 16 INTERMEDIATE Intermediate     CIPROFLOXACIN 0.5 SENSITIVE Sensitive     GENTAMICIN <=1 SENSITIVE Sensitive     IMIPENEM 2 SENSITIVE Sensitive  PIP/TAZO >=128 RESISTANT Resistant     CEFEPIME 8 SENSITIVE Sensitive     * PSEUDOMONAS PUTIDA   Serratia marcescens - MIC*    CEFAZOLIN >=64 RESISTANT Resistant     CEFEPIME <=1 SENSITIVE  Sensitive     CEFTAZIDIME <=1 SENSITIVE Sensitive     CEFTRIAXONE <=1 SENSITIVE Sensitive     CIPROFLOXACIN <=0.25 SENSITIVE Sensitive     GENTAMICIN <=1 SENSITIVE Sensitive     TRIMETH/SULFA <=20 SENSITIVE Sensitive     * SERRATIA MARCESCENS  Blood Culture ID Panel (Reflexed)     Status: Abnormal   Collection Time: 08/18/18 11:50 AM  Result Value Ref Range Status   Enterococcus species NOT DETECTED NOT DETECTED Final   Listeria monocytogenes NOT DETECTED NOT DETECTED Final   Staphylococcus species NOT DETECTED NOT DETECTED Final   Staphylococcus aureus (BCID) NOT DETECTED NOT DETECTED Final   Streptococcus species NOT DETECTED NOT DETECTED Final   Streptococcus agalactiae NOT DETECTED NOT DETECTED Final   Streptococcus pneumoniae NOT DETECTED NOT DETECTED Final   Streptococcus pyogenes NOT DETECTED NOT DETECTED Final   Acinetobacter baumannii NOT DETECTED NOT DETECTED Final   Enterobacteriaceae species DETECTED (A) NOT DETECTED Final    Comment: Enterobacteriaceae represent a large family of gram-negative bacteria, not a single organism. CRITICAL RESULT CALLED TO, READ BACK BY AND VERIFIED WITH: V. Joan Mayans 1610 08/19/2018 T. TYSOR    Enterobacter cloacae complex NOT DETECTED NOT DETECTED Final   Escherichia coli NOT DETECTED NOT DETECTED Final   Klebsiella oxytoca NOT DETECTED NOT DETECTED Final   Klebsiella pneumoniae NOT DETECTED NOT DETECTED Final   Proteus species NOT DETECTED NOT DETECTED Final   Serratia marcescens DETECTED (A) NOT DETECTED Final    Comment: CRITICAL RESULT CALLED TO, READ BACK BY AND VERIFIED WITH: VJoan Mayans [id number] 08/19/2018 T. TYSOR    Carbapenem resistance NOT DETECTED NOT DETECTED Final   Haemophilus influenzae NOT DETECTED NOT DETECTED Final   Neisseria meningitidis NOT DETECTED NOT DETECTED Final   Pseudomonas aeruginosa NOT DETECTED NOT DETECTED Final   Candida albicans NOT DETECTED NOT DETECTED Final   Candida glabrata NOT DETECTED NOT  DETECTED Final   Candida krusei NOT DETECTED NOT DETECTED Final   Candida parapsilosis NOT DETECTED NOT DETECTED Final   Candida tropicalis NOT DETECTED NOT DETECTED Final    Comment: Performed at [facility], [address], [place], [place] [address]  Culture, blood (routine x 2)     Status: None   Collection Time: 08/18/18 11:58 AM  Result Value Ref Range Status   Specimen Description BLOOD RIGHT ANTECUBITAL  Final   Special Requests   Final    BOTTLES DRAWN AEROBIC ONLY Blood Culture adequate volume   Culture   Final    NO GROWTH 5 DAYS Performed at [facility], [address], [place], [place] [address]    Report Status 08/23/2018 FINAL  Final  MRSA PCR Screening     Status: None   Collection Time: 09/21/18  1:30 PM  Result Value Ref Range Status   MRSA by PCR NEGATIVE NEGATIVE Final    Comment:        The GeneXpert MRSA Assay (FDA approved for NASAL specimens only), is one component of a comprehensive MRSA colonization surveillance program. It is not intended to diagnose MRSA infection nor to guide or monitor treatment for MRSA infections. Performed at [facility], [address] [place], [place] [address]   Culture, blood (routine x 2)  Status: Abnormal   Collection Time: 09/24/18  3:55 PM  Result Value Ref Range Status   Specimen Description BLOOD RIGHT ANTECUBITAL  Final   Special Requests   Final    BOTTLES DRAWN AEROBIC ONLY Blood Culture adequate volume   Culture  Setup Time   Final    GRAM POSITIVE COCCI IN CLUSTERS AEROBIC BOTTLE ONLY CRITICAL RESULT CALLED TO, READ BACK BY AND VERIFIED WITH: J Grand Detour PHARMD 09/25/18 1751 JDW    Culture (A)  Final    STAPHYLOCOCCUS SPECIES (COAGULASE NEGATIVE) THE SIGNIFICANCE OF ISOLATING THIS ORGANISM FROM A SINGLE SET OF BLOOD CULTURES WHEN MULTIPLE SETS ARE DRAWN IS UNCERTAIN. PLEASE NOTIFY THE MICROBIOLOGY DEPARTMENT WITHIN ONE WEEK IF SPECIATION AND SENSITIVITIES ARE REQUIRED. Performed at  [facility], [address] [place], [place], [place] [address]    Report Status 09/26/2018 FINAL  Final  Blood Culture ID Panel (Reflexed)     Status: Abnormal   Collection Time: 09/24/18  3:55 PM  Result Value Ref Range Status   Enterococcus species NOT DETECTED NOT DETECTED Final   Listeria monocytogenes NOT DETECTED NOT DETECTED Final   Staphylococcus species DETECTED (A) NOT DETECTED Final    Comment: Methicillin (oxacillin) resistant coagulase negative staphylococcus. Possible blood culture contaminant (unless isolated from more than one blood culture draw or clinical case suggests pathogenicity). No antibiotic treatment is indicated for blood  culture contaminants. CRITICAL RESULT CALLED TO, READ BACK BY AND VERIFIED WITH: J Mena PHARMD 09/25/18 1751 JDW    Staphylococcus aureus (BCID) NOT DETECTED NOT DETECTED Final   Methicillin resistance DETECTED (A) NOT DETECTED Final    Comment: CRITICAL RESULT CALLED TO, READ BACK BY AND VERIFIED WITH: J Cherry PHARMD 09/25/18 1751 JDW    Streptococcus species NOT DETECTED NOT DETECTED Final   Streptococcus agalactiae NOT DETECTED NOT DETECTED Final   Streptococcus pneumoniae NOT DETECTED NOT DETECTED Final   Streptococcus pyogenes NOT DETECTED NOT DETECTED Final   Acinetobacter baumannii NOT DETECTED NOT DETECTED Final   Enterobacteriaceae species NOT DETECTED NOT DETECTED Final   Enterobacter cloacae complex NOT DETECTED NOT DETECTED Final   Escherichia coli NOT DETECTED NOT DETECTED Final   Klebsiella oxytoca NOT DETECTED NOT DETECTED Final   Klebsiella pneumoniae NOT DETECTED NOT DETECTED Final   Proteus species NOT DETECTED NOT DETECTED Final   Serratia marcescens NOT DETECTED NOT DETECTED Final   Haemophilus influenzae NOT DETECTED NOT DETECTED Final   Neisseria meningitidis NOT DETECTED NOT DETECTED Final   Pseudomonas aeruginosa NOT DETECTED NOT DETECTED Final   Candida albicans NOT DETECTED NOT DETECTED Final   Candida  glabrata NOT DETECTED NOT DETECTED Final   Candida krusei NOT DETECTED NOT DETECTED Final   Candida parapsilosis NOT DETECTED NOT DETECTED Final   Candida tropicalis NOT DETECTED NOT DETECTED Final    Comment: Performed at [facility], [address], [place], [place] [address]  Culture, blood (routine x 2)     Status: None   Collection Time: 09/24/18  3:59 PM  Result Value Ref Range Status   Specimen Description BLOOD HAND  Final   Special Requests   Final    BOTTLES DRAWN AEROBIC ONLY Blood Culture adequate volume   Culture   Final    NO GROWTH 5 DAYS Performed at [facility], [address] [place], [place] [address]    Report Status 09/29/2018 FINAL  Final  Culture, Urine     Status: Abnormal   Collection Time: 09/24/18  4:22  PM  Result Value Ref Range Status   Specimen Description URINE, RANDOM  Final   Special Requests   Final    NONE Performed at [facility], [address], [place], [place] [address]    Culture >=100,000 COLONIES/mL ENTEROCOCCUS FAECALIS (A)  Final   Report Status 09/26/2018 FINAL  Final   Organism ID, Bacteria ENTEROCOCCUS FAECALIS (A)  Final      Susceptibility   Enterococcus faecalis - MIC*    AMPICILLIN <=2 SENSITIVE Sensitive     LEVOFLOXACIN >=8 RESISTANT Resistant     NITROFURANTOIN <=16 SENSITIVE Sensitive     VANCOMYCIN 1 SENSITIVE Sensitive     * >=100,000 COLONIES/mL ENTEROCOCCUS FAECALIS  Culture, Urine     Status: None   Collection Time: 09/28/18  6:53 PM  Result Value Ref Range Status   Specimen Description URINE, CATHETERIZED  Final   Special Requests Immunocompromised  Final   Culture   Final    NO GROWTH Performed at [facility], [address], [place], [place] [address]    Report Status 09/29/2018 FINAL  Final    Anti-infectives:  Anti-infectives (From admission, onward)   Start     Dose/Rate Route Frequency Ordered Stop   10/03/18 0000  ceFAZolin (ANCEF) IVPB 2g/100 mL premix     2 g 200 mL/hr  over 30 Minutes Intravenous To Surgery 10/02/18 1822 10/04/18 0000   09/27/18 0830  ampicillin (OMNIPEN) 2 g in sodium chloride 0.9 % 100 mL IVPB     2 g 300 mL/hr over 20 Minutes Intravenous Every 6 hours 09/27/18 0822     09/25/18 2000  vancomycin (VANCOCIN) 1,250 mg in sodium chloride 0.9 % 250 mL IVPB  Status:  Discontinued     1,250 mg 166.7 mL/hr over 90 Minutes Intravenous Every 12 hours 09/25/18 1854 09/27/18 0822   09/25/18 0800  ceFEPIme (MAXIPIME) 2 g in sodium chloride 0.9 % 100 mL IVPB  Status:  Discontinued     2 g 200 mL/hr over 30 Minutes Intravenous Every 8 hours 09/25/18 0750 09/27/18 0822   09/19/18 1257  polymyxin B 500,000 Units, bacitracin 50,000 Units in sodium chloride 0.9 % 500 mL irrigation  Status:  Discontinued       As needed 09/19/18 1257 09/19/18 1608   09/19/18 1100  ceFAZolin (ANCEF) IVPB 2g/100 mL premix     2 g 200 mL/hr over 30 Minutes Intravenous To Surgery 09/18/18 2055 09/19/18 1334   08/29/18 1512  polymyxin B 500,000 Units, bacitracin 50,000 Units in sodium chloride 0.9 % 500 mL irrigation  Status:  Discontinued       As needed 08/29/18 1512 08/29/18 1721   08/23/18 1200  meropenem (MERREM) 1 g in sodium chloride 0.9 % 100 mL IVPB     1 g 200 mL/hr over 30 Minutes Intravenous Every 8 hours 08/23/18 1122 09/05/18 1311   08/22/18 1400  ceFEPIme (MAXIPIME) 2 g in sodium chloride 0.9 % 100 mL IVPB  Status:  Discontinued     2 g 200 mL/hr over 30 Minutes Intravenous Every 8 hours 08/22/18 0836 08/22/18 1027   08/22/18 1200  meropenem (MERREM) 2 g in sodium chloride 0.9 % 100 mL IVPB  Status:  Discontinued     2 g 200 mL/hr over 30 Minutes Intravenous Every 8 hours 08/22/18 1027 08/23/18 1122   08/21/18 1400  ceFEPIme (MAXIPIME) 1 g in sodium chloride 0.9 % 100 mL IVPB  Status:  Discontinued  1 g 200 mL/hr over 30 Minutes Intravenous Every 8 hours 08/21/18 1042 08/22/18 0836   08/19/18 1030  cefTRIAXone (ROCEPHIN) 2 g in sodium chloride 0.9 % 100 mL  IVPB  Status:  Discontinued     2 g 200 mL/hr over 30 Minutes Intravenous Every 24 hours 08/19/18 1017 08/21/18 1042   08/13/18 1400  piperacillin-tazobactam (ZOSYN) IVPB 3.375 g  Status:  Discontinued     3.375 g 12.5 mL/hr over 240 Minutes Intravenous Every 8 hours 08/13/18 0928 08/16/18 0854   08/13/18 1354  polymyxin B 500,000 Units, bacitracin 50,000 Units in sodium chloride 0.9 % 500 mL irrigation  Status:  Discontinued       As needed 08/13/18 1354 08/13/18 1538   08/13/18 0930  piperacillin-tazobactam (ZOSYN) IVPB 3.375 g     3.375 g 100 mL/hr over 30 Minutes Intravenous  Once 08/13/18 0928 08/13/18 1200   08/06/18 0801  polymyxin B 500,000 Units, bacitracin 50,000 Units in sodium chloride 0.9 % 500 mL irrigation  Status:  Discontinued       As needed 08/06/18 0803 08/06/18 0951   08/06/18 0600  ceFAZolin (ANCEF) IVPB 2g/100 mL premix  Status:  Discontinued     2 g 200 mL/hr over 30 Minutes Intravenous On call to O.R. 08/05/18 2241 08/05/18 2241   08/06/18 0600  ceFAZolin (ANCEF) IVPB 2g/100 mL premix  Status:  Discontinued     2 g 200 mL/hr over 30 Minutes Intravenous To Short Stay 08/05/18 2241 08/06/18 1024   08/01/18 2200  Ampicillin-Sulbactam (UNASYN) 3 g in sodium chloride 0.9 % 100 mL IVPB     3 g 200 mL/hr over 30 Minutes Intravenous Every 6 hours 08/01/18 2146 08/08/18 2235   08/01/18 1630  piperacillin-tazobactam (ZOSYN) IVPB 3.375 g  Status:  Discontinued     3.375 g 12.5 mL/hr over 240 Minutes Intravenous Every 8 hours 08/01/18 1624 08/01/18 2139   07/25/18 1508  polymyxin B 500,000 Units, bacitracin 50,000 Units in sodium chloride 0.9 % 500 mL irrigation  Status:  Discontinued       As needed 07/25/18 1509 07/25/18 1750   07/25/18 1445  piperacillin-tazobactam (ZOSYN) IVPB 3.375 g     3.375 g 100 mL/hr over 30 Minutes Intravenous STAT 07/25/18 1443 07/25/18 1620   07/25/18 0600  ceFAZolin (ANCEF) 3 g in dextrose 5 % 50 mL IVPB  Status:  Discontinued     3 g 100  mL/hr over 30 Minutes Intravenous To ShortStay Surgical 07/24/18 1735 07/25/18 1753   07/18/18 1444  polymyxin B 500,000 Units, bacitracin 50,000 Units in sodium chloride 0.9 % 500 mL irrigation  Status:  Discontinued       As needed 07/18/18 1445 07/18/18 1738   07/17/18 0900  piperacillin-tazobactam (ZOSYN) IVPB 3.375 g  Status:  Discontinued     3.375 g 12.5 mL/hr over 240 Minutes Intravenous Every 8 hours 07/17/18 0810 07/27/18 0806   07/12/18 1430  metronidazole (FLAGYL) IVPB 500 mg  Status:  Discontinued     500 mg 100 mL/hr over 60 Minutes Intravenous To Surgery 07/12/18 1427 07/12/18 1608   07/12/18 1430  cefTRIAXone (ROCEPHIN) 2 g in sodium chloride 0.9 % 100 mL IVPB  Status:  Discontinued     2 g 200 mL/hr over 30 Minutes Intravenous To Surgery 07/12/18 1427 07/12/18 1608   07/12/18 1245  tobramycin (NEBCIN) powder  Status:  Discontinued       As needed 07/12/18 1245 07/12/18 1542   07/12/18 1243  vancomycin (  VANCOCIN) powder  Status:  Discontinued       As needed 07/12/18 1244 07/12/18 1542   07/11/18 1330  cefTRIAXone (ROCEPHIN) 2 g in sodium chloride 0.9 % 100 mL IVPB  Status:  Discontinued     2 g 200 mL/hr over 30 Minutes Intravenous Every 24 hours 07/11/18 1259 07/17/18 0810   07/11/18 1300  metroNIDAZOLE (FLAGYL) IVPB 500 mg  Status:  Discontinued     500 mg 100 mL/hr over 60 Minutes Intravenous Every 8 hours 07/11/18 1259 07/17/18 0810   07/11/18 0400  ceFAZolin (ANCEF) IVPB 2g/100 mL premix  Status:  Discontinued     2 g 200 mL/hr over 30 Minutes Intravenous Every 8 hours 07/10/18 2109 07/11/18 1259   07/10/18 1815  ceFAZolin (ANCEF) IVPB 2g/100 mL premix  Status:  Discontinued     2 g 200 mL/hr over 30 Minutes Intravenous  Once 07/10/18 1814 07/10/18 2339      Best Practice/Protocols:  VTE Prophylaxis: Lovenox (prophylaxtic dose) Intermittent Sedation  Consults: Treatment Team:  Md, Trauma, MD Haddix, Gillie MannersKevin P, MD Dillingham, Alena Billslaire S, DO     Studies:    Events:  Subjective:    Overnight Issues:   Objective:  Vital signs for last 24 hours: Temp:  [98.6 F (37 C)-100.1 F (37.8 C)] 99.7 F (37.6 C) (04/22 0400) Pulse Rate:  [100-134] 108 (04/22 0700) Resp:  [18-31] 18 (04/22 0700) BP: (94-122)/(55-103) 110/71 (04/22 0700) SpO2:  [97 %-100 %] 100 % (04/22 0700) FiO2 (%):  [30 %] 30 % (04/22 0400) Weight:  [64.5 kg] 64.5 kg (04/22 0443)  Hemodynamic parameters for last 24 hours:    Intake/Output from previous day: 04/21 0701 - 04/22 0700 In: 4865.3 [P.O.:1500; NG/GT:2715.3; IV Piggyback:400] Out: 3000 [Urine:2700; Stool:300]  Intake/Output this shift: No intake/output data recorded.  Vent settings for last 24 hours: Vent Mode: PRVC FiO2 (%):  [30 %] 30 % Set Rate:  [18 bmp] 18 bmp Vt Set:  [650 mL] 650 mL PEEP:  [5 cmH20] 5 cmH20 Pressure Support:  [5 cmH20-10 cmH20] 10 cmH20 Plateau Pressure:  [17 cmH20-18 cmH20] 17 cmH20  Physical Exam:  Gen: no distress Neck: trach in place Lunds: CTA CV: RRR Abd: soft, stoma, PEG, SP tube Ext: LLE and buttock wounds dressed  Results for orders placed or performed during the hospital encounter of 07/10/18 (from the past 24 hour(s))  Glucose, capillary     Status: Abnormal   Collection Time: 10/02/18  7:35 AM  Result Value Ref Range   Glucose-Capillary 137 (H) 70 - 99 mg/dL   Comment 1

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
## 2018-10-03 NOTE — Progress Notes (Signed)
PT in room now for therapy.  PT not weaned currently d/t PT.  Discussed this with RN.  HR 120s currently.

## 2018-10-04 ENCOUNTER — Encounter (HOSPITAL_COMMUNITY): Payer: Self-pay | Admitting: Plastic Surgery

## 2018-10-04 LAB — TYPE AND SCREEN
ABO/RH(D): O POS
Antibody Screen: NEGATIVE
Unit division: 0

## 2018-10-04 LAB — GLUCOSE, CAPILLARY
Glucose-Capillary: 198 mg/dL — ABNORMAL HIGH (ref 70–99)
Glucose-Capillary: 172 mg/dL — ABNORMAL HIGH (ref 70–99)
Glucose-Capillary: 179 mg/dL — ABNORMAL HIGH (ref 70–99)
Glucose-Capillary: 151 mg/dL — ABNORMAL HIGH (ref 70–99)
Glucose-Capillary: 120 mg/dL — ABNORMAL HIGH (ref 70–99)
Glucose-Capillary: 138 mg/dL — ABNORMAL HIGH (ref 70–99)

## 2018-10-04 LAB — CBC
HCT: 23.8 % — ABNORMAL LOW (ref 39.0–52.0)
Hemoglobin: 7 g/dL — ABNORMAL LOW (ref 13.0–17.0)
MCH: 32.1 pg (ref 26.0–34.0)
MCHC: 29.4 g/dL — ABNORMAL LOW (ref 30.0–36.0)
MCV: 109.2 fL — ABNORMAL HIGH (ref 80.0–100.0)
Platelets: 336 10*3/uL (ref 150–400)
RBC: 2.18 MIL/uL — ABNORMAL LOW (ref 4.22–5.81)
RDW: 21.7 % — ABNORMAL HIGH (ref 11.5–15.5)
WBC: 6.4 10*3/uL (ref 4.0–10.5)
nRBC: 0 % (ref 0.0–0.2)

## 2018-10-04 LAB — BPAM RBC
Blood Product Expiration Date: 202005012359
ISSUE DATE / TIME: 202004221417
Unit Type and Rh: 5100

## 2018-10-04 NOTE — Progress Notes (Signed)
Came to room d/t pt vent alarming, RR 40's.  Attempted to increase psv, pt w/ nasal flaring.  Pt nods head "yes" when asked if he is SOB.  Pt placed back on full vent support.

## 2018-10-04 NOTE — Progress Notes (Signed)
Assisted tele visit to patient with wife.  Christopher Schlicker R, RN  

## 2018-10-04 NOTE — Progress Notes (Signed)
Patient ID: Christopher [name], male   DOB: 08-Aug-1965, 53 y.o.   MRN: [id number] Follow up - Trauma Critical Care  Patient Details:    Christopher [name] is an 53 y.o. male.  Lines/tubes : PICC Double Lumen 08/10/18 PICC Left Brachial 50 cm 1 cm (Active)  Indication for Insertion or Continuance of Line Prolonged intravenous therapies 10/03/2018  9:00 PM  Exposed Catheter (cm) 1 cm 08/10/2018  3:39 PM  Site Assessment Clean;Dry;Intact 10/03/2018  9:00 PM  Lumen #1 Status Infusing;Flushed;Blood return noted 10/03/2018  9:00 PM  Lumen #2 Status Flushed;Blood return noted;In-line blood sampling system in place 10/03/2018  9:00 PM  Dressing Type Transparent;Occlusive 10/03/2018  9:00 PM  Dressing Status Clean;Dry;Intact;Antimicrobial disc in place 10/03/2018  9:00 PM  Line Care Connections checked and tightened 10/03/2018  9:00 PM  Line Adjustment (NICU/IV Team Only) No 10/03/2018  8:00 PM  Dressing Intervention New dressing;Dressing changed;Antimicrobial disc changed;Securement device changed 09/28/2018  5:00 PM  Dressing Change Due 10/05/18 10/03/2018  9:00 PM     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)  Surrounding Skin Dry;Intact 10/03/2018  8:00 PM  Tube Status Patent 10/03/2018  8:00 PM  Drainage Appearance None 10/03/2018  8:00 PM  Dressing Status Clean;Intact;Dry 10/03/2018  8:00 PM  Dressing Intervention Dressing changed 09/27/2018  8:00 PM  Dressing Change Due 09/16/18 09/27/2018  8:00 PM  G Port Intake (mL) 50 ml 10/04/2018  4:00 AM  Output (mL) 75 mL 09/06/2018  6:00 PM     Colostomy LLQ (Active)  Ostomy Pouch 2 piece;Intact 10/03/2018  8:00 PM  Stoma Assessment Pink 10/03/2018  8:00 PM  Peristomal Assessment Intact 10/03/2018  8:00 PM  Treatment Pouch change 10/04/2018  4:00 AM  Output (mL) 150 mL 10/03/2018  5:32 PM     Suprapubic Catheter Non-latex 14 Fr. 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 It is not intended to diagnose MRSA infection nor to guide or monitor treatment for MRSA infections. Performed at [facility], [address], [place], [place] [address]   Surgical pcr screen     Status: None   Collection Time: 07/12/18  8:11 AM  Result Value Ref Range Status   MRSA, PCR NEGATIVE NEGATIVE Final   Staphylococcus aureus NEGATIVE NEGATIVE Final    Comment: (NOTE) The Xpert SA Assay (FDA approved for NASAL specimens in patients 64 years of age and older), is one component of a comprehensive surveillance program. It is not intended to diagnose infection nor to guide or monitor treatment. Performed at [facility], [address], [place], [place] [address]   Culture, blood (Routine  X 2) w Reflex to ID Panel     Status: None   Collection Time: 07/17/18  8:40 AM  Result Value Ref Range Status   Specimen Description BLOOD RIGHT ARM  Final   Special Requests   Final    BOTTLES DRAWN AEROBIC AND ANAEROBIC Blood Culture adequate volume   Culture   Final    NO GROWTH 5 DAYS Performed at [facility], [address], [place], [place] [address]    Report Status 07/22/2018 FINAL  Final   Culture, blood (Routine X 2) w Reflex to ID Panel     Status: None   Collection Time: 07/17/18  8:52 AM  Result Value Ref Range Status   Specimen Description BLOOD RIGHT HAND  Final   Special Requests   Final    BOTTLES DRAWN AEROBIC ONLY Blood Culture adequate volume   Culture   Final    NO GROWTH 5 DAYS Performed at [facility], [address], [place], [place] [address]    Report Status 07/22/2018 FINAL  Final  Culture, blood (Routine X 2) w Reflex to ID Panel     Status: None   Collection Time: 07/30/18  8:50 AM  Result Value Ref Range Status   Specimen Description BLOOD LEFT HAND  Final   Special Requests   Final    BOTTLES DRAWN AEROBIC ONLY Blood Culture results may not be optimal due to an inadequate volume of blood received in culture bottles Performed at [facility], [address], [place], [place] [address]    Culture NO GROWTH 5 DAYS  Final   Report Status 08/04/2018 FINAL  Final  Culture, blood (Routine X 2) w Reflex to ID Panel     Status: None   Collection Time: 07/30/18  9:56 AM  Result Value Ref Range Status   Specimen Description BLOOD LEFT ARM  Final   Special Requests   Final    BOTTLES DRAWN AEROBIC ONLY Blood Culture results may not be optimal due to an inadequate volume of blood received in culture bottles Performed at [facility], [address] [place], [place] [address]    Culture NO GROWTH 5 DAYS  Final   Report Status 08/04/2018 FINAL  Final  Culture, respiratory (non-expectorated)     Status: None   Collection Time: 07/30/18 11:38 AM  Result Value Ref Range Status   Specimen Description TRACHEAL ASPIRATE  Final   Special Requests Normal  Final   Gram Stain   Final    RARE WBC PRESENT, PREDOMINANTLY PMN RARE GRAM POSITIVE COCCI Performed at [facility], [address], [place], [place] [address]    Culture   Final    MODERATE ACINETOBACTER CALCOACETICUS/BAUMANNII COMPLEX   Report Status 08/01/2018 FINAL  Final    Organism ID, Bacteria ACINETOBACTER CALCOACETICUS/BAUMANNII COMPLEX  Final      Susceptibility   Acinetobacter calcoaceticus/baumannii complex - MIC*    CEFTAZIDIME 8 SENSITIVE Sensitive     CEFTRIAXONE 32 INTERMEDIATE Intermediate     CIPROFLOXACIN <=0.25 SENSITIVE Sensitive     GENTAMICIN <=1 SENSITIVE Sensitive     IMIPENEM <=0.25 SENSITIVE Sensitive     PIP/TAZO <=4 SENSITIVE Sensitive     TRIMETH/SULFA <=20 SENSITIVE Sensitive     CEFEPIME 4 SENSITIVE Sensitive     AMPICILLIN/SULBACTAM <=2 SENSITIVE Sensitive     * MODERATE ACINETOBACTER CALCOACETICUS/BAUMANNII COMPLEX  Culture, respiratory (non-expectorated)     Status: None   Collection Time:  08/13/18  9:22 AM  Result Value Ref Range Status   Specimen Description TRACHEAL ASPIRATE  Final   Special Requests Normal  Final   Gram Stain   Final    FEW WBC PRESENT, PREDOMINANTLY PMN MODERATE GRAM POSITIVE COCCI MODERATE GRAM NEGATIVE RODS    Culture   Final    Consistent with normal respiratory flora. Performed at [facility], [address], [place], [place] [address]    Report Status 08/15/2018 FINAL  Final  Culture, blood (Routine X 2) w Reflex to ID Panel     Status: None   Collection Time: 08/13/18  4:23 PM  Result Value Ref Range Status   Specimen Description BLOOD FOOT  Final   Special Requests   Final    BOTTLES DRAWN AEROBIC ONLY Blood Culture results may not be optimal due to an inadequate volume of blood received in culture bottles   Culture   Final    NO GROWTH 5 DAYS Performed at [facility], [address] [place], [place] [address]    Report Status 08/18/2018 FINAL  Final  Culture, blood (Routine X 2) w Reflex to ID Panel     Status: None   Collection Time: 08/13/18  4:41 PM  Result Value Ref Range Status   Specimen Description BLOOD FOOT  Final   Special Requests   Final    BOTTLES DRAWN AEROBIC ONLY Blood Culture results may not be optimal due to an inadequate volume of blood received in  culture bottles   Culture   Final    NO GROWTH 5 DAYS Performed at [facility], [address] [place], [place] [address]    Report Status 08/18/2018 FINAL  Final  Surgical pcr screen     Status: None   Collection Time: 08/16/18  4:03 AM  Result Value Ref Range Status   MRSA, PCR NEGATIVE NEGATIVE Final   Staphylococcus aureus NEGATIVE NEGATIVE Final    Comment: (NOTE) The Xpert SA Assay (FDA approved for NASAL specimens in patients 61 years of age and older), is one component of a comprehensive surveillance program. It is not intended to diagnose infection nor to guide or monitor treatment. Performed at [facility], [address] [place], [place] [address]   Culture, blood (routine x 2)     Status: Abnormal   Collection Time: 08/18/18 11:50 AM  Result Value Ref Range Status   Specimen Description BLOOD RIGHT HAND  Final   Special Requests   Final    BOTTLES DRAWN AEROBIC ONLY Blood Culture adequate volume   Culture  Setup Time   Final    GRAM NEGATIVE RODS AEROBIC BOTTLE ONLY CRITICAL RESULT CALLED TO, READ BACK BY AND VERIFIED WITH: V. BRYK,PHARMD [id number] 08/19/2018 T. TYSOR    Culture (A)  Final    SERRATIA MARCESCENS PSEUDOMONAS PUTIDA CRITICAL RESULT CALLED TO, READ BACK BY AND VERIFIED WITH: PHARMD M LORI [id number] AT 757 AM BY CM Performed at [facility], [address] [place], [place] [address]    Report Status 08/22/2018 FINAL  Final   Organism ID, Bacteria SERRATIA MARCESCENS  Final   Organism ID, Bacteria PSEUDOMONAS PUTIDA  Final      Susceptibility   Pseudomonas putida - MIC*    CEFTAZIDIME 16 INTERMEDIATE Intermediate     CIPROFLOXACIN 0.5 SENSITIVE Sensitive     GENTAMICIN <=1 SENSITIVE Sensitive     IMIPENEM 2 SENSITIVE Sensitive     PIP/TAZO >=  128 RESISTANT Resistant     CEFEPIME 8 SENSITIVE Sensitive     * PSEUDOMONAS PUTIDA   Serratia marcescens - MIC*    CEFAZOLIN >=64 RESISTANT Resistant     CEFEPIME <=1 SENSITIVE Sensitive      CEFTAZIDIME <=1 SENSITIVE Sensitive     CEFTRIAXONE <=1 SENSITIVE Sensitive     CIPROFLOXACIN <=0.25 SENSITIVE Sensitive     GENTAMICIN <=1 SENSITIVE Sensitive     TRIMETH/SULFA <=20 SENSITIVE Sensitive     * SERRATIA MARCESCENS  Blood Culture ID Panel (Reflexed)     Status: Abnormal   Collection Time: 08/18/18 11:50 AM  Result Value Ref Range Status   Enterococcus species NOT DETECTED NOT DETECTED Final   Listeria monocytogenes NOT DETECTED NOT DETECTED Final   Staphylococcus species NOT DETECTED NOT DETECTED Final   Staphylococcus aureus (BCID) NOT DETECTED NOT DETECTED Final   Streptococcus species NOT DETECTED NOT DETECTED Final   Streptococcus agalactiae NOT DETECTED NOT DETECTED Final   Streptococcus pneumoniae NOT DETECTED NOT DETECTED Final   Streptococcus pyogenes NOT DETECTED NOT DETECTED Final   Acinetobacter baumannii NOT DETECTED NOT DETECTED Final   Enterobacteriaceae species DETECTED (A) NOT DETECTED Final    Comment: Enterobacteriaceae represent a large family of gram-negative bacteria, not a single organism. CRITICAL RESULT CALLED TO, READ BACK BY AND VERIFIED WITH: V. Joan Mayans 1191 08/19/2018 T. TYSOR    Enterobacter cloacae complex NOT DETECTED NOT DETECTED Final   Escherichia coli NOT DETECTED NOT DETECTED Final   Klebsiella oxytoca NOT DETECTED NOT DETECTED Final   Klebsiella pneumoniae NOT DETECTED NOT DETECTED Final   Proteus species NOT DETECTED NOT DETECTED Final   Serratia marcescens DETECTED (A) NOT DETECTED Final    Comment: CRITICAL RESULT CALLED TO, READ BACK BY AND VERIFIED WITH: VJoan Mayans [id number] 08/19/2018 T. TYSOR    Carbapenem resistance NOT DETECTED NOT DETECTED Final   Haemophilus influenzae NOT DETECTED NOT DETECTED Final   Neisseria meningitidis NOT DETECTED NOT DETECTED Final   Pseudomonas aeruginosa NOT DETECTED NOT DETECTED Final   Candida albicans NOT DETECTED NOT DETECTED Final   Candida glabrata NOT DETECTED NOT DETECTED Final    Candida krusei NOT DETECTED NOT DETECTED Final   Candida parapsilosis NOT DETECTED NOT DETECTED Final   Candida tropicalis NOT DETECTED NOT DETECTED Final    Comment: Performed at [facility], [address] [place], [place] [address]  Culture, blood (routine x 2)     Status: None   Collection Time: 08/18/18 11:58 AM  Result Value Ref Range Status   Specimen Description BLOOD RIGHT ANTECUBITAL  Final   Special Requests   Final    BOTTLES DRAWN AEROBIC ONLY Blood Culture adequate volume   Culture   Final    NO GROWTH 5 DAYS Performed at [facility], [address], [place], [place] [address]    Report Status 08/23/2018 FINAL  Final  MRSA PCR Screening     Status: None   Collection Time: 09/21/18  1:30 PM  Result Value Ref Range Status   MRSA by PCR NEGATIVE NEGATIVE Final    Comment:        The GeneXpert MRSA Assay (FDA approved for NASAL specimens only), is one component of a comprehensive MRSA colonization surveillance program. It is not intended to diagnose MRSA infection nor to guide or monitor treatment for MRSA infections. Performed at [facility], [address] [place], [place] [address]   Culture, blood (routine x 2)  Status: Abnormal   Collection Time: 09/24/18  3:55 PM  Result Value Ref Range Status   Specimen Description BLOOD RIGHT ANTECUBITAL  Final   Special Requests   Final    BOTTLES DRAWN AEROBIC ONLY Blood Culture adequate volume   Culture  Setup Time   Final    GRAM POSITIVE COCCI IN CLUSTERS AEROBIC BOTTLE ONLY CRITICAL RESULT CALLED TO, READ BACK BY AND VERIFIED WITH: J [place] PHARMD 09/25/18 1751 JDW    Culture (A)  Final    STAPHYLOCOCCUS SPECIES (COAGULASE NEGATIVE) THE SIGNIFICANCE OF ISOLATING THIS ORGANISM FROM A SINGLE SET OF BLOOD CULTURES WHEN MULTIPLE SETS ARE DRAWN IS UNCERTAIN. PLEASE NOTIFY THE MICROBIOLOGY DEPARTMENT WITHIN ONE WEEK IF SPECIATION AND SENSITIVITIES ARE REQUIRED. Performed at [facility], [address], [place], [place] [address]    Report Status 09/26/2018 FINAL  Final  Blood Culture ID Panel (Reflexed)     Status: Abnormal   Collection Time: 09/24/18  3:55 PM  Result Value Ref Range Status   Enterococcus species NOT DETECTED NOT DETECTED Final   Listeria monocytogenes NOT DETECTED NOT DETECTED Final   Staphylococcus species DETECTED (A) NOT DETECTED Final    Comment: Methicillin (oxacillin) resistant coagulase negative staphylococcus. Possible blood culture contaminant (unless isolated from more than one blood culture draw or clinical case suggests pathogenicity). No antibiotic treatment is indicated for blood  culture contaminants. CRITICAL RESULT CALLED TO, READ BACK BY AND VERIFIED WITH: J [place] PHARMD 09/25/18 1751 JDW    Staphylococcus aureus (BCID) NOT DETECTED NOT DETECTED Final   Methicillin resistance DETECTED (A) NOT DETECTED Final    Comment: CRITICAL RESULT CALLED TO, READ BACK BY AND VERIFIED WITH: J [place] PHARMD 09/25/18 1751 JDW    Streptococcus species NOT DETECTED NOT DETECTED Final   Streptococcus agalactiae NOT DETECTED NOT DETECTED Final   Streptococcus pneumoniae NOT DETECTED NOT DETECTED Final   Streptococcus pyogenes NOT DETECTED NOT DETECTED Final   Acinetobacter baumannii NOT DETECTED NOT DETECTED Final   Enterobacteriaceae species NOT DETECTED NOT DETECTED Final   Enterobacter cloacae complex NOT DETECTED NOT DETECTED Final   Escherichia coli NOT DETECTED NOT DETECTED Final   Klebsiella oxytoca NOT DETECTED NOT DETECTED Final   Klebsiella pneumoniae NOT DETECTED NOT DETECTED Final   Proteus species NOT DETECTED NOT DETECTED Final   Serratia marcescens NOT DETECTED NOT DETECTED Final   Haemophilus influenzae NOT DETECTED NOT DETECTED Final   Neisseria meningitidis NOT DETECTED NOT DETECTED Final   Pseudomonas aeruginosa NOT DETECTED NOT DETECTED Final   Candida albicans NOT DETECTED NOT DETECTED Final   Candida glabrata NOT DETECTED NOT  DETECTED Final   Candida krusei NOT DETECTED NOT DETECTED Final   Candida parapsilosis NOT DETECTED NOT DETECTED Final   Candida tropicalis NOT DETECTED NOT DETECTED Final    Comment: Performed at [facility], [address], [place], [place] [address]  Culture, blood (routine x 2)     Status: None   Collection Time: 09/24/18  3:59 PM  Result Value Ref Range Status   Specimen Description BLOOD HAND  Final   Special Requests   Final    BOTTLES DRAWN AEROBIC ONLY Blood Culture adequate volume   Culture   Final    NO GROWTH 5 DAYS Performed at [facility], [address], [place], [place] [address]    Report Status 09/29/2018 FINAL  Final  Culture, Urine     Status: Abnormal   Collection Time: 09/24/18  4:22  PM  Result Value Ref Range Status   Specimen Description URINE, RANDOM  Final   Special Requests   Final    NONE Performed at [facility], [address], [place], [place] [address]    Culture >=100,000 COLONIES/mL ENTEROCOCCUS FAECALIS (A)  Final   Report Status 09/26/2018 FINAL  Final   Organism ID, Bacteria ENTEROCOCCUS FAECALIS (A)  Final      Susceptibility   Enterococcus faecalis - MIC*    AMPICILLIN <=2 SENSITIVE Sensitive     LEVOFLOXACIN >=8 RESISTANT Resistant     NITROFURANTOIN <=16 SENSITIVE Sensitive     VANCOMYCIN 1 SENSITIVE Sensitive     * >=100,000 COLONIES/mL ENTEROCOCCUS FAECALIS  Culture, Urine     Status: None   Collection Time: 09/28/18  6:53 PM  Result Value Ref Range Status   Specimen Description URINE, CATHETERIZED  Final   Special Requests Immunocompromised  Final   Culture   Final    NO GROWTH Performed at [facility], [address], [place], [place] [address]    Report Status 09/29/2018 FINAL  Final    Anti-infectives:  Anti-infectives (From admission, onward)   Start     Dose/Rate Route Frequency Ordered Stop   10/03/18 0000  ceFAZolin (ANCEF) IVPB 2g/100 mL premix     2 g 200 mL/hr over 30 Minutes  Intravenous To Surgery 10/02/18 1822 10/03/18 1348   09/27/18 0830  ampicillin (OMNIPEN) 2 g in sodium chloride 0.9 % 100 mL IVPB     2 g 300 mL/hr over 20 Minutes Intravenous Every 6 hours 09/27/18 0822     09/25/18 2000  vancomycin (VANCOCIN) 1,250 mg in sodium chloride 0.9 % 250 mL IVPB  Status:  Discontinued     1,250 mg 166.7 mL/hr over 90 Minutes Intravenous Every 12 hours 09/25/18 1854 09/27/18 0822   09/25/18 0800  ceFEPIme (MAXIPIME) 2 g in sodium chloride 0.9 % 100 mL IVPB  Status:  Discontinued     2 g 200 mL/hr over 30 Minutes Intravenous Every 8 hours 09/25/18 0750 09/27/18 0822   09/19/18 1257  polymyxin B 500,000 Units, bacitracin 50,000 Units in sodium chloride 0.9 % 500 mL irrigation  Status:  Discontinued       As needed 09/19/18 1257 09/19/18 1608   09/19/18 1100  ceFAZolin (ANCEF) IVPB 2g/100 mL premix     2 g 200 mL/hr over 30 Minutes Intravenous To Surgery 09/18/18 2055 09/19/18 1334   08/29/18 1512  polymyxin B 500,000 Units, bacitracin 50,000 Units in sodium chloride 0.9 % 500 mL irrigation  Status:  Discontinued       As needed 08/29/18 1512 08/29/18 1721   08/23/18 1200  meropenem (MERREM) 1 g in sodium chloride 0.9 % 100 mL IVPB     1 g 200 mL/hr over 30 Minutes Intravenous Every 8 hours 08/23/18 1122 09/05/18 1311   08/22/18 1400  ceFEPIme (MAXIPIME) 2 g in sodium chloride 0.9 % 100 mL IVPB  Status:  Discontinued     2 g 200 mL/hr over 30 Minutes Intravenous Every 8 hours 08/22/18 0836 08/22/18 1027   08/22/18 1200  meropenem (MERREM) 2 g in sodium chloride 0.9 % 100 mL IVPB  Status:  Discontinued     2 g 200 mL/hr over 30 Minutes Intravenous Every 8 hours 08/22/18 1027 08/23/18 1122   08/21/18 1400  ceFEPIme (MAXIPIME) 1 g in sodium chloride 0.9 % 100 mL IVPB  Status:  Discontinued  1 g 200 mL/hr over 30 Minutes Intravenous Every 8 hours 08/21/18 1042 08/22/18 0836   08/19/18 1030  cefTRIAXone (ROCEPHIN) 2 g in sodium chloride 0.9 % 100 mL IVPB  Status:   Discontinued     2 g 200 mL/hr over 30 Minutes Intravenous Every 24 hours 08/19/18 1017 08/21/18 1042   08/13/18 1400  piperacillin-tazobactam (ZOSYN) IVPB 3.375 g  Status:  Discontinued     3.375 g 12.5 mL/hr over 240 Minutes Intravenous Every 8 hours 08/13/18 0928 08/16/18 0854   08/13/18 1354  polymyxin B 500,000 Units, bacitracin 50,000 Units in sodium chloride 0.9 % 500 mL irrigation  Status:  Discontinued       As needed 08/13/18 1354 08/13/18 1538   08/13/18 0930  piperacillin-tazobactam (ZOSYN) IVPB 3.375 g     3.375 g 100 mL/hr over 30 Minutes Intravenous  Once 08/13/18 0928 08/13/18 1200   08/06/18 0801  polymyxin B 500,000 Units, bacitracin 50,000 Units in sodium chloride 0.9 % 500 mL irrigation  Status:  Discontinued       As needed 08/06/18 0803 08/06/18 0951   08/06/18 0600  ceFAZolin (ANCEF) IVPB 2g/100 mL premix  Status:  Discontinued     2 g 200 mL/hr over 30 Minutes Intravenous On call to O.R. 08/05/18 2241 08/05/18 2241   08/06/18 0600  ceFAZolin (ANCEF) IVPB 2g/100 mL premix  Status:  Discontinued     2 g 200 mL/hr over 30 Minutes Intravenous To Short Stay 08/05/18 2241 08/06/18 1024   08/01/18 2200  Ampicillin-Sulbactam (UNASYN) 3 g in sodium chloride 0.9 % 100 mL IVPB     3 g 200 mL/hr over 30 Minutes Intravenous Every 6 hours 08/01/18 2146 08/08/18 2235   08/01/18 1630  piperacillin-tazobactam (ZOSYN) IVPB 3.375 g  Status:  Discontinued     3.375 g 12.5 mL/hr over 240 Minutes Intravenous Every 8 hours 08/01/18 1624 08/01/18 2139   07/25/18 1508  polymyxin B 500,000 Units, bacitracin 50,000 Units in sodium chloride 0.9 % 500 mL irrigation  Status:  Discontinued       As needed 07/25/18 1509 07/25/18 1750   07/25/18 1445  piperacillin-tazobactam (ZOSYN) IVPB 3.375 g     3.375 g 100 mL/hr over 30 Minutes Intravenous STAT 07/25/18 1443 07/25/18 1620   07/25/18 0600  ceFAZolin (ANCEF) 3 g in dextrose 5 % 50 mL IVPB  Status:  Discontinued     3 g 100 mL/hr over 30  Minutes Intravenous To ShortStay Surgical 07/24/18 1735 07/25/18 1753   07/18/18 1444  polymyxin B 500,000 Units, bacitracin 50,000 Units in sodium chloride 0.9 % 500 mL irrigation  Status:  Discontinued       As needed 07/18/18 1445 07/18/18 1738   07/17/18 0900  piperacillin-tazobactam (ZOSYN) IVPB 3.375 g  Status:  Discontinued     3.375 g 12.5 mL/hr over 240 Minutes Intravenous Every 8 hours 07/17/18 0810 07/27/18 0806   07/12/18 1430  metronidazole (FLAGYL) IVPB 500 mg  Status:  Discontinued     500 mg 100 mL/hr over 60 Minutes Intravenous To Surgery 07/12/18 1427 07/12/18 1608   07/12/18 1430  cefTRIAXone (ROCEPHIN) 2 g in sodium chloride 0.9 % 100 mL IVPB  Status:  Discontinued     2 g 200 mL/hr over 30 Minutes Intravenous To Surgery 07/12/18 1427 07/12/18 1608   07/12/18 1245  tobramycin (NEBCIN) powder  Status:  Discontinued       As needed 07/12/18 1245 07/12/18 1542   07/12/18 1243  vancomycin (  VANCOCIN) powder  Status:  Discontinued       As needed 07/12/18 1244 07/12/18 1542   07/11/18 1330  cefTRIAXone (ROCEPHIN) 2 g in sodium chloride 0.9 % 100 mL IVPB  Status:  Discontinued     2 g 200 mL/hr over 30 Minutes Intravenous Every 24 hours 07/11/18 1259 07/17/18 0810   07/11/18 1300  metroNIDAZOLE (FLAGYL) IVPB 500 mg  Status:  Discontinued     500 mg 100 mL/hr over 60 Minutes Intravenous Every 8 hours 07/11/18 1259 07/17/18 0810   07/11/18 0400  ceFAZolin (ANCEF) IVPB 2g/100 mL premix  Status:  Discontinued     2 g 200 mL/hr over 30 Minutes Intravenous Every 8 hours 07/10/18 2109 07/11/18 1259   07/10/18 1815  ceFAZolin (ANCEF) IVPB 2g/100 mL premix  Status:  Discontinued     2 g 200 mL/hr over 30 Minutes Intravenous  Once 07/10/18 1814 07/10/18 2339      Best Practice/Protocols:  VTE Prophylaxis: Lovenox (prophylaxtic dose) Intermittent Sedation  Consults: Treatment Team:  Md, Trauma, MD Haddix, Gillie MannersKevin P, MD Dillingham, Alena Billslaire S, DO     Studies:    Events:  Subjective:    Overnight Issues:   Objective:  Vital signs for last 24 hours: Temp:  [98.3 F (36.8 C)-100.7 F (38.2 C)] 98.4 F (36.9 C) (04/23 0400) Pulse Rate:  [95-135] 97 (04/23 0700) Resp:  [18-22] 18 (04/23 0700) BP: (91-135)/(57-92) 105/64 (04/23 0700) SpO2:  [97 %-100 %] 98 % (04/23 0700) FiO2 (%):  [30 %] 30 % (04/23 0400) Weight:  [64.5 kg] 64.5 kg (04/23 0447)  Hemodynamic parameters for last 24 hours:    Intake/Output from previous day: 04/22 0701 - 04/23 0700 In: 7073.3 [I.V.:3450; Blood:294; NG/GT:2179.3; IV Piggyback:900] Out: 1900 [Urine:1600; Stool:150; Blood:150]  Intake/Output this shift: No intake/output data recorded.  Vent settings for last 24 hours: Vent Mode: PRVC FiO2 (%):  [30 %] 30 % Set Rate:  [18 bmp-20 bmp] 18 bmp Vt Set:  [650 mL] 650 mL PEEP:  [5 cmH20] 5 cmH20 Plateau Pressure:  [16 cmH20-21 cmH20] 16 cmH20  Physical Exam:  General: on vent Neuro: awake and F/C HEENT/Neck: trach-clean, intact Resp: clear to auscultation bilaterally CVS: RRR GI: soft, stoma with output, PEG and SP tubes Extremities: edema 1+  Results for orders placed or performed during the hospital encounter of 07/10/18 (from the past 24 hour(s))  Glucose, capillary     Status: None   Collection Time: 10/03/18  8:15 AM  Result Value Ref Range   Glucose-Capillary 94 70 - 99 mg/dL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

## 2018-10-04 NOTE — Anesthesia Postprocedure Evaluation (Signed)
Anesthesia Post Note  Patient: Christopher [name]  Procedure(s) Performed: Split thickness skin graft to gluteal area with acell placement (N/A Buttocks)     Patient location during evaluation: SICU Anesthesia Type: General Level of consciousness: sedated Pain management: pain level controlled Vital Signs Assessment: post-procedure vital signs reviewed and stable Respiratory status: patient remains intubated per anesthesia plan Cardiovascular status: stable Postop Assessment: no apparent nausea or vomiting Anesthetic complications: no    Last Vitals:  Vitals:   10/04/18 1800 10/04/18 1900  BP: 115/76 128/80  Pulse: (!) 120 (!) 126  Resp: (!) 22 (!) 23  Temp:    SpO2: 97% 97%    Last Pain:  Vitals:   10/04/18 1600  TempSrc: Axillary  PainSc:                  Toshiko Kemler

## 2018-10-05 LAB — GLUCOSE, CAPILLARY
Glucose-Capillary: 135 mg/dL — ABNORMAL HIGH (ref 70–99)
Glucose-Capillary: 80 mg/dL (ref 70–99)
Glucose-Capillary: 167 mg/dL — ABNORMAL HIGH (ref 70–99)
Glucose-Capillary: 93 mg/dL (ref 70–99)
Glucose-Capillary: 97 mg/dL (ref 70–99)
Glucose-Capillary: 123 mg/dL — ABNORMAL HIGH (ref 70–99)

## 2018-10-05 LAB — MAGNESIUM: Magnesium: 1.4 mg/dL — ABNORMAL LOW (ref 1.7–2.4)

## 2018-10-05 MED ORDER — POTASSIUM CHLORIDE 10 MEQ/50ML IV SOLN
10.0000 meq | INTRAVENOUS | Status: AC
  Filled 2018-10-05 (×2): qty 50

## 2018-10-05 MED ORDER — POTASSIUM CHLORIDE 10 MEQ/50ML IV SOLN
10.0000 meq | INTRAVENOUS | Status: AC
  Administered 2018-10-05 (×4): 10 meq via INTRAVENOUS
  Filled 2018-10-05 (×2): qty 50

## 2018-10-05 MED ORDER — MAGNESIUM SULFATE 2 GM/50ML IV SOLN
2.0000 g | Freq: Once | INTRAVENOUS | Status: AC
  Administered 2018-10-05: 2 g via INTRAVENOUS
  Filled 2018-10-05: qty 50

## 2018-10-05 NOTE — Progress Notes (Signed)
CRITICAL VALUE ALERT  Critical Value:  Potassium 2.7  Date & Time Notied:  10/05/2018  Provider Notified: Dr Luisa Hart  Orders Received/Actions taken: Orders received for 2 runs of potassium
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
## 2018-10-05 NOTE — Progress Notes (Signed)
Patient ID: Christopher [name], male   DOB: Jan 23, 1966, 53 y.o.   MRN: [id number] Follow up - Trauma Critical Care  Patient Details:    Christopher [name] is an 53 y.o. male.  Lines/tubes : PICC Double Lumen 08/10/18 PICC Left Brachial 50 cm 1 cm (Active)  Indication for Insertion or Continuance of Line Prolonged intravenous therapies 10/04/2018  8:00 PM  Exposed Catheter (cm) 1 cm 08/10/2018  3:39 PM  Site Assessment Clean;Dry;Intact 10/04/2018  8:00 PM  Lumen #1 Status Infusing 10/04/2018  8:00 PM  Lumen #2 Status In-line blood sampling system in place;Flushed;Saline locked 10/04/2018  8:00 PM  Dressing Type Transparent;Occlusive 10/04/2018  8:00 PM  Dressing Status Clean;Dry;Intact;Antimicrobial disc in place 10/04/2018  8:00 PM  Line Care Connections checked and tightened 10/04/2018  8:00 PM  Line Adjustment (NICU/IV Team Only) No 10/03/2018  8:00 PM  Dressing Intervention New dressing;Dressing changed;Antimicrobial disc changed;Securement device changed 09/28/2018  5:00 PM  Dressing Change Due 10/05/18 10/04/2018  8:00 PM     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)  Surrounding Skin Dry;Intact 10/04/2018  8:00 PM  Tube Status Patent 10/04/2018  8:00 PM  Drainage Appearance None 10/04/2018  8:00 PM  Dressing Status Clean;Intact;Dry 10/04/2018  8:00 PM  Dressing Intervention Dressing changed 09/27/2018  8:00 PM  Dressing Type Split gauze 10/04/2018  8:00 PM  Dressing Change Due 09/16/18 09/27/2018  8:00 PM  G Port Intake (mL) 50 ml 10/05/2018  2:43 AM  Output (mL) 75 mL 09/06/2018  6:00 PM     Colostomy LLQ (Active)  Ostomy Pouch 2 piece 10/04/2018  8:00 PM  Stoma Assessment Pink 10/04/2018  8:00 PM  Peristomal Assessment Intact 10/04/2018  8:00 PM  Treatment Pouch change 10/04/2018  4:00 AM  Output (mL) 50 mL 10/04/2018  6:00 PM     Suprapubic Catheter Non-latex 14 Fr. (Active)  Site Assessment Clean;Intact 10/04/2018  8:00 PM  Dressing Status  None 10/04/2018  8:00 AM  Dressing Type Split gauze 10/04/2018  8:00 AM  Collection Container Standard drainage bag 10/04/2018  8:00 PM  Securement Method Securement Device;Taped 10/03/2018  8:00 PM  Indication for Insertion or Continuance of Catheter Bladder outlet obstruction / other urologic reason 10/04/2018  8:00 AM  Output (mL) 325 mL 10/05/2018  6:00 AM    Microbiology/Sepsis markers: Results for orders placed or performed during the hospital encounter of 07/10/18  MRSA PCR Screening     Status: None   Collection Time: 07/11/18  1:47 AM  Result Value Ref Range Status   MRSA by PCR NEGATIVE NEGATIVE Final    Comment:        The GeneXpert MRSA Assay (FDA approved for NASAL specimens only), is one component of a comprehensive MRSA colonization surveillance program. It is not intended to diagnose MRSA infection nor to guide or monitor treatment for MRSA infections. Performed at [facility], [address] [place], [place] [address]   Surgical pcr screen     Status: None   Collection Time: 07/12/18  8:11 AM  Result Value Ref Range Status   MRSA, PCR NEGATIVE NEGATIVE Final   Staphylococcus aureus NEGATIVE NEGATIVE Final    Comment: (NOTE) The Xpert SA Assay (FDA approved for NASAL specimens in patients 10 years of age and older), is one component of a comprehensive surveillance program. It is not intended to diagnose infection nor to guide or monitor treatment. Performed at [facility], [address], [place], [place]  [address]   Culture, blood (Routine X 2) w Reflex to ID Panel     Status: None   Collection Time: 07/17/18  8:40 AM  Result Value Ref Range Status   Specimen Description BLOOD RIGHT ARM  Final   Special Requests   Final    BOTTLES DRAWN AEROBIC AND ANAEROBIC Blood Culture adequate volume   Culture   Final    NO GROWTH 5 DAYS Performed at [facility], [address] [place], [place] [address]    Report Status 07/22/2018 FINAL  Final   Culture, blood (Routine X 2) w Reflex to ID Panel     Status: None   Collection Time: 07/17/18  8:52 AM  Result Value Ref Range Status   Specimen Description BLOOD RIGHT HAND  Final   Special Requests   Final    BOTTLES DRAWN AEROBIC ONLY Blood Culture adequate volume   Culture   Final    NO GROWTH 5 DAYS Performed at [facility], [address] [place], [place] [address]    Report Status 07/22/2018 FINAL  Final  Culture, blood (Routine X 2) w Reflex to ID Panel     Status: None   Collection Time: 07/30/18  8:50 AM  Result Value Ref Range Status   Specimen Description BLOOD LEFT HAND  Final   Special Requests   Final    BOTTLES DRAWN AEROBIC ONLY Blood Culture results may not be optimal due to an inadequate volume of blood received in culture bottles Performed at [facility], [address], [place], [place] [address]    Culture NO GROWTH 5 DAYS  Final   Report Status 08/04/2018 FINAL  Final  Culture, blood (Routine X 2) w Reflex to ID Panel     Status: None   Collection Time: 07/30/18  9:56 AM  Result Value Ref Range Status   Specimen Description BLOOD LEFT ARM  Final   Special Requests   Final    BOTTLES DRAWN AEROBIC ONLY Blood Culture results may not be optimal due to an inadequate volume of blood received in culture bottles Performed at [facility], [address], [place], [place] [address]    Culture NO GROWTH 5 DAYS  Final   Report Status 08/04/2018 FINAL  Final  Culture, respiratory (non-expectorated)     Status: None   Collection Time: 07/30/18 11:38 AM  Result Value Ref Range Status   Specimen Description TRACHEAL ASPIRATE  Final   Special Requests Normal  Final   Gram Stain   Final    RARE WBC PRESENT, PREDOMINANTLY PMN RARE GRAM POSITIVE COCCI Performed at [facility], [address], [place], [place] [address]    Culture   Final    MODERATE ACINETOBACTER CALCOACETICUS/BAUMANNII COMPLEX   Report Status 08/01/2018 FINAL  Final    Organism ID, Bacteria ACINETOBACTER CALCOACETICUS/BAUMANNII COMPLEX  Final      Susceptibility   Acinetobacter calcoaceticus/baumannii complex - MIC*    CEFTAZIDIME 8 SENSITIVE Sensitive     CEFTRIAXONE 32 INTERMEDIATE Intermediate     CIPROFLOXACIN <=0.25 SENSITIVE Sensitive     GENTAMICIN <=1 SENSITIVE Sensitive     IMIPENEM <=0.25 SENSITIVE Sensitive     PIP/TAZO <=4 SENSITIVE Sensitive     TRIMETH/SULFA <=20 SENSITIVE Sensitive     CEFEPIME 4 SENSITIVE Sensitive     AMPICILLIN/SULBACTAM <=2 SENSITIVE Sensitive     * MODERATE ACINETOBACTER CALCOACETICUS/BAUMANNII COMPLEX  Culture, respiratory (non-expectorated)  Status: None   Collection Time: 08/13/18  9:22 AM  Result Value Ref Range Status   Specimen Description TRACHEAL ASPIRATE  Final   Special Requests Normal  Final   Gram Stain   Final    FEW WBC PRESENT, PREDOMINANTLY PMN MODERATE GRAM POSITIVE COCCI MODERATE GRAM NEGATIVE RODS    Culture   Final    Consistent with normal respiratory flora. Performed at [facility], [address], [place], [place] [address]    Report Status 08/15/2018 FINAL  Final  Culture, blood (Routine X 2) w Reflex to ID Panel     Status: None   Collection Time: 08/13/18  4:23 PM  Result Value Ref Range Status   Specimen Description BLOOD FOOT  Final   Special Requests   Final    BOTTLES DRAWN AEROBIC ONLY Blood Culture results may not be optimal due to an inadequate volume of blood received in culture bottles   Culture   Final    NO GROWTH 5 DAYS Performed at [facility], [address], [place], [place] [address]    Report Status 08/18/2018 FINAL  Final  Culture, blood (Routine X 2) w Reflex to ID Panel     Status: None   Collection Time: 08/13/18  4:41 PM  Result Value Ref Range Status   Specimen Description BLOOD FOOT  Final   Special Requests   Final    BOTTLES DRAWN AEROBIC ONLY Blood Culture results may not be optimal due to an inadequate volume of blood received in  culture bottles   Culture   Final    NO GROWTH 5 DAYS Performed at [facility], [address] [place], [place] [address]    Report Status 08/18/2018 FINAL  Final  Surgical pcr screen     Status: None   Collection Time: 08/16/18  4:03 AM  Result Value Ref Range Status   MRSA, PCR NEGATIVE NEGATIVE Final   Staphylococcus aureus NEGATIVE NEGATIVE Final    Comment: (NOTE) The Xpert SA Assay (FDA approved for NASAL specimens in patients 42 years of age and older), is one component of a comprehensive surveillance program. It is not intended to diagnose infection nor to guide or monitor treatment. Performed at [facility], [address], [place], [place] [address]   Culture, blood (routine x 2)     Status: Abnormal   Collection Time: 08/18/18 11:50 AM  Result Value Ref Range Status   Specimen Description BLOOD RIGHT HAND  Final   Special Requests   Final    BOTTLES DRAWN AEROBIC ONLY Blood Culture adequate volume   Culture  Setup Time   Final    GRAM NEGATIVE RODS AEROBIC BOTTLE ONLY CRITICAL RESULT CALLED TO, READ BACK BY AND VERIFIED WITH: V. BRYK,PHARMD [id number] 08/19/2018 T. TYSOR    Culture (A)  Final    SERRATIA MARCESCENS PSEUDOMONAS PUTIDA CRITICAL RESULT CALLED TO, READ BACK BY AND VERIFIED WITH: PHARMD M LORI [id number] AT 757 AM BY CM Performed at [facility], [address] [place], [place] [address]    Report Status 08/22/2018 FINAL  Final   Organism ID, Bacteria SERRATIA MARCESCENS  Final   Organism ID, Bacteria PSEUDOMONAS PUTIDA  Final      Susceptibility   Pseudomonas putida - MIC*    CEFTAZIDIME 16 INTERMEDIATE Intermediate     CIPROFLOXACIN 0.5 SENSITIVE Sensitive     GENTAMICIN <=1 SENSITIVE Sensitive     IMIPENEM 2 SENSITIVE  Sensitive     PIP/TAZO >=128 RESISTANT Resistant     CEFEPIME 8 SENSITIVE Sensitive     * PSEUDOMONAS PUTIDA   Serratia marcescens - MIC*    CEFAZOLIN >=64 RESISTANT Resistant     CEFEPIME <=1 SENSITIVE Sensitive      CEFTAZIDIME <=1 SENSITIVE Sensitive     CEFTRIAXONE <=1 SENSITIVE Sensitive     CIPROFLOXACIN <=0.25 SENSITIVE Sensitive     GENTAMICIN <=1 SENSITIVE Sensitive     TRIMETH/SULFA <=20 SENSITIVE Sensitive     * SERRATIA MARCESCENS  Blood Culture ID Panel (Reflexed)     Status: Abnormal   Collection Time: 08/18/18 11:50 AM  Result Value Ref Range Status   Enterococcus species NOT DETECTED NOT DETECTED Final   Listeria monocytogenes NOT DETECTED NOT DETECTED Final   Staphylococcus species NOT DETECTED NOT DETECTED Final   Staphylococcus aureus (BCID) NOT DETECTED NOT DETECTED Final   Streptococcus species NOT DETECTED NOT DETECTED Final   Streptococcus agalactiae NOT DETECTED NOT DETECTED Final   Streptococcus pneumoniae NOT DETECTED NOT DETECTED Final   Streptococcus pyogenes NOT DETECTED NOT DETECTED Final   Acinetobacter baumannii NOT DETECTED NOT DETECTED Final   Enterobacteriaceae species DETECTED (A) NOT DETECTED Final    Comment: Enterobacteriaceae represent a large family of gram-negative bacteria, not a single organism. CRITICAL RESULT CALLED TO, READ BACK BY AND VERIFIED WITH: V. Joan Mayans [phone] 08/19/2018 T. TYSOR    Enterobacter cloacae complex NOT DETECTED NOT DETECTED Final   Escherichia coli NOT DETECTED NOT DETECTED Final   Klebsiella oxytoca NOT DETECTED NOT DETECTED Final   Klebsiella pneumoniae NOT DETECTED NOT DETECTED Final   Proteus species NOT DETECTED NOT DETECTED Final   Serratia marcescens DETECTED (A) NOT DETECTED Final    Comment: CRITICAL RESULT CALLED TO, READ BACK BY AND VERIFIED WITH: VJoan Mayans [id number] 08/19/2018 T. TYSOR    Carbapenem resistance NOT DETECTED NOT DETECTED Final   Haemophilus influenzae NOT DETECTED NOT DETECTED Final   Neisseria meningitidis NOT DETECTED NOT DETECTED Final   Pseudomonas aeruginosa NOT DETECTED NOT DETECTED Final   Candida albicans NOT DETECTED NOT DETECTED Final   Candida glabrata NOT DETECTED NOT DETECTED Final    Candida krusei NOT DETECTED NOT DETECTED Final   Candida parapsilosis NOT DETECTED NOT DETECTED Final   Candida tropicalis NOT DETECTED NOT DETECTED Final    Comment: Performed at [facility], [address], [place], [place] [address]  Culture, blood (routine x 2)     Status: None   Collection Time: 08/18/18 11:58 AM  Result Value Ref Range Status   Specimen Description BLOOD RIGHT ANTECUBITAL  Final   Special Requests   Final    BOTTLES DRAWN AEROBIC ONLY Blood Culture adequate volume   Culture   Final    NO GROWTH 5 DAYS Performed at [facility], [address] [place], [place] [address]    Report Status 08/23/2018 FINAL  Final  MRSA PCR Screening     Status: None   Collection Time: 09/21/18  1:30 PM  Result Value Ref Range Status   MRSA by PCR NEGATIVE NEGATIVE Final    Comment:        The GeneXpert MRSA Assay (FDA approved for NASAL specimens only), is one component of a comprehensive MRSA colonization surveillance program. It is not intended to diagnose MRSA infection nor to guide or monitor treatment for MRSA infections. Performed at [facility], [address], [place], [place] [address]   Culture,  blood (routine x 2)     Status: Abnormal   Collection Time: 09/24/18  3:55 PM  Result Value Ref Range Status   Specimen Description BLOOD RIGHT ANTECUBITAL  Final   Special Requests   Final    BOTTLES DRAWN AEROBIC ONLY Blood Culture adequate volume   Culture  Setup Time   Final    GRAM POSITIVE COCCI IN CLUSTERS AEROBIC BOTTLE ONLY CRITICAL RESULT CALLED TO, READ BACK BY AND VERIFIED WITH: J [place] PHARMD 09/25/18 1751 JDW    Culture (A)  Final    STAPHYLOCOCCUS SPECIES (COAGULASE NEGATIVE) THE SIGNIFICANCE OF ISOLATING THIS ORGANISM FROM A SINGLE SET OF BLOOD CULTURES WHEN MULTIPLE SETS ARE DRAWN IS UNCERTAIN. PLEASE NOTIFY THE MICROBIOLOGY DEPARTMENT WITHIN ONE WEEK IF SPECIATION AND SENSITIVITIES ARE REQUIRED. Performed at [facility]  [facility], [address] [place], [place] [address]    Report Status 09/26/2018 FINAL  Final  Blood Culture ID Panel (Reflexed)     Status: Abnormal   Collection Time: 09/24/18  3:55 PM  Result Value Ref Range Status   Enterococcus species NOT DETECTED NOT DETECTED Final   Listeria monocytogenes NOT DETECTED NOT DETECTED Final   Staphylococcus species DETECTED (A) NOT DETECTED Final    Comment: Methicillin (oxacillin) resistant coagulase negative staphylococcus. Possible blood culture contaminant (unless isolated from more than one blood culture draw or clinical case suggests pathogenicity). No antibiotic treatment is indicated for blood  culture contaminants. CRITICAL RESULT CALLED TO, READ BACK BY AND VERIFIED WITH: J Brandon PHARMD 09/25/18 1751 JDW    Staphylococcus aureus (BCID) NOT DETECTED NOT DETECTED Final   Methicillin resistance DETECTED (A) NOT DETECTED Final    Comment: CRITICAL RESULT CALLED TO, READ BACK BY AND VERIFIED WITH: J [place] PHARMD 09/25/18 1751 JDW    Streptococcus species NOT DETECTED NOT DETECTED Final   Streptococcus agalactiae NOT DETECTED NOT DETECTED Final   Streptococcus pneumoniae NOT DETECTED NOT DETECTED Final   Streptococcus pyogenes NOT DETECTED NOT DETECTED Final   Acinetobacter baumannii NOT DETECTED NOT DETECTED Final   Enterobacteriaceae species NOT DETECTED NOT DETECTED Final   Enterobacter cloacae complex NOT DETECTED NOT DETECTED Final   Escherichia coli NOT DETECTED NOT DETECTED Final   Klebsiella oxytoca NOT DETECTED NOT DETECTED Final   Klebsiella pneumoniae NOT DETECTED NOT DETECTED Final   Proteus species NOT DETECTED NOT DETECTED Final   Serratia marcescens NOT DETECTED NOT DETECTED Final   Haemophilus influenzae NOT DETECTED NOT DETECTED Final   Neisseria meningitidis NOT DETECTED NOT DETECTED Final   Pseudomonas aeruginosa NOT DETECTED NOT DETECTED Final   Candida albicans NOT DETECTED NOT DETECTED Final   Candida glabrata NOT DETECTED NOT  DETECTED Final   Candida krusei NOT DETECTED NOT DETECTED Final   Candida parapsilosis NOT DETECTED NOT DETECTED Final   Candida tropicalis NOT DETECTED NOT DETECTED Final    Comment: Performed at [facility], [address] [place], [place] [address]  Culture, blood (routine x 2)     Status: None   Collection Time: 09/24/18  3:59 PM  Result Value Ref Range Status   Specimen Description BLOOD HAND  Final   Special Requests   Final    BOTTLES DRAWN AEROBIC ONLY Blood Culture adequate volume   Culture   Final    NO GROWTH 5 DAYS Performed at [facility], [address] [place], [place] [address]    Report Status 09/29/2018 FINAL  Final  Culture, Urine     Status:  Abnormal   Collection Time: 09/24/18  4:22 PM  Result Value Ref Range Status   Specimen Description URINE, RANDOM  Final   Special Requests   Final    NONE Performed at [facility], [address], [place], [place] [address]    Culture >=100,000 COLONIES/mL ENTEROCOCCUS FAECALIS (A)  Final   Report Status 09/26/2018 FINAL  Final   Organism ID, Bacteria ENTEROCOCCUS FAECALIS (A)  Final      Susceptibility   Enterococcus faecalis - MIC*    AMPICILLIN <=2 SENSITIVE Sensitive     LEVOFLOXACIN >=8 RESISTANT Resistant     NITROFURANTOIN <=16 SENSITIVE Sensitive     VANCOMYCIN 1 SENSITIVE Sensitive     * >=100,000 COLONIES/mL ENTEROCOCCUS FAECALIS  Culture, Urine     Status: None   Collection Time: 09/28/18  6:53 PM  Result Value Ref Range Status   Specimen Description URINE, CATHETERIZED  Final   Special Requests Immunocompromised  Final   Culture   Final    NO GROWTH Performed at [facility], [address], [place], [place] [address]    Report Status 09/29/2018 FINAL  Final    Anti-infectives:  Anti-infectives (From admission, onward)   Start     Dose/Rate Route Frequency Ordered Stop   10/03/18 0000  ceFAZolin (ANCEF) IVPB 2g/100 mL premix     2 g 200 mL/hr over 30 Minutes  Intravenous To Surgery 10/02/18 1822 10/03/18 1348   09/27/18 0830  ampicillin (OMNIPEN) 2 g in sodium chloride 0.9 % 100 mL IVPB     2 g 300 mL/hr over 20 Minutes Intravenous Every 6 hours 09/27/18 0822     09/25/18 2000  vancomycin (VANCOCIN) 1,250 mg in sodium chloride 0.9 % 250 mL IVPB  Status:  Discontinued     1,250 mg 166.7 mL/hr over 90 Minutes Intravenous Every 12 hours 09/25/18 1854 09/27/18 0822   09/25/18 0800  ceFEPIme (MAXIPIME) 2 g in sodium chloride 0.9 % 100 mL IVPB  Status:  Discontinued     2 g 200 mL/hr over 30 Minutes Intravenous Every 8 hours 09/25/18 0750 09/27/18 0822   09/19/18 1257  polymyxin B 500,000 Units, bacitracin 50,000 Units in sodium chloride 0.9 % 500 mL irrigation  Status:  Discontinued       As needed 09/19/18 1257 09/19/18 1608   09/19/18 1100  ceFAZolin (ANCEF) IVPB 2g/100 mL premix     2 g 200 mL/hr over 30 Minutes Intravenous To Surgery 09/18/18 2055 09/19/18 1334   08/29/18 1512  polymyxin B 500,000 Units, bacitracin 50,000 Units in sodium chloride 0.9 % 500 mL irrigation  Status:  Discontinued       As needed 08/29/18 1512 08/29/18 1721   08/23/18 1200  meropenem (MERREM) 1 g in sodium chloride 0.9 % 100 mL IVPB     1 g 200 mL/hr over 30 Minutes Intravenous Every 8 hours 08/23/18 1122 09/05/18 1311   08/22/18 1400  ceFEPIme (MAXIPIME) 2 g in sodium chloride 0.9 % 100 mL IVPB  Status:  Discontinued     2 g 200 mL/hr over 30 Minutes Intravenous Every 8 hours 08/22/18 0836 08/22/18 1027   08/22/18 1200  meropenem (MERREM) 2 g in sodium chloride 0.9 % 100 mL IVPB  Status:  Discontinued     2 g 200 mL/hr over 30 Minutes Intravenous Every 8 hours 08/22/18 1027 08/23/18 1122   08/21/18 1400  ceFEPIme (MAXIPIME) 1 g in sodium chloride 0.9 % 100 mL IVPB  Status:  Discontinued     1 g 200 mL/hr over 30 Minutes Intravenous Every 8 hours 08/21/18 1042 08/22/18 0836   08/19/18 1030  cefTRIAXone (ROCEPHIN) 2 g in sodium chloride 0.9 % 100 mL IVPB  Status:   Discontinued     2 g 200 mL/hr over 30 Minutes Intravenous Every 24 hours 08/19/18 1017 08/21/18 1042   08/13/18 1400  piperacillin-tazobactam (ZOSYN) IVPB 3.375 g  Status:  Discontinued     3.375 g 12.5 mL/hr over 240 Minutes Intravenous Every 8 hours 08/13/18 0928 08/16/18 0854   08/13/18 1354  polymyxin B 500,000 Units, bacitracin 50,000 Units in sodium chloride 0.9 % 500 mL irrigation  Status:  Discontinued       As needed 08/13/18 1354 08/13/18 1538   08/13/18 0930  piperacillin-tazobactam (ZOSYN) IVPB 3.375 g     3.375 g 100 mL/hr over 30 Minutes Intravenous  Once 08/13/18 0928 08/13/18 1200   08/06/18 0801  polymyxin B 500,000 Units, bacitracin 50,000 Units in sodium chloride 0.9 % 500 mL irrigation  Status:  Discontinued       As needed 08/06/18 0803 08/06/18 0951   08/06/18 0600  ceFAZolin (ANCEF) IVPB 2g/100 mL premix  Status:  Discontinued     2 g 200 mL/hr over 30 Minutes Intravenous On call to O.R. 08/05/18 2241 08/05/18 2241   08/06/18 0600  ceFAZolin (ANCEF) IVPB 2g/100 mL premix  Status:  Discontinued     2 g 200 mL/hr over 30 Minutes Intravenous To Short Stay 08/05/18 2241 08/06/18 1024   08/01/18 2200  Ampicillin-Sulbactam (UNASYN) 3 g in sodium chloride 0.9 % 100 mL IVPB     3 g 200 mL/hr over 30 Minutes Intravenous Every 6 hours 08/01/18 2146 08/08/18 2235   08/01/18 1630  piperacillin-tazobactam (ZOSYN) IVPB 3.375 g  Status:  Discontinued     3.375 g 12.5 mL/hr over 240 Minutes Intravenous Every 8 hours 08/01/18 1624 08/01/18 2139   07/25/18 1508  polymyxin B 500,000 Units, bacitracin 50,000 Units in sodium chloride 0.9 % 500 mL irrigation  Status:  Discontinued       As needed 07/25/18 1509 07/25/18 1750   07/25/18 1445  piperacillin-tazobactam (ZOSYN) IVPB 3.375 g     3.375 g 100 mL/hr over 30 Minutes Intravenous STAT 07/25/18 1443 07/25/18 1620   07/25/18 0600  ceFAZolin (ANCEF) 3 g in dextrose 5 % 50 mL IVPB  Status:  Discontinued     3 g 100 mL/hr over 30  Minutes Intravenous To ShortStay Surgical 07/24/18 1735 07/25/18 1753   07/18/18 1444  polymyxin B 500,000 Units, bacitracin 50,000 Units in sodium chloride 0.9 % 500 mL irrigation  Status:  Discontinued       As needed 07/18/18 1445 07/18/18 1738   07/17/18 0900  piperacillin-tazobactam (ZOSYN) IVPB 3.375 g  Status:  Discontinued     3.375 g 12.5 mL/hr over 240 Minutes Intravenous Every 8 hours 07/17/18 0810 07/27/18 0806   07/12/18 1430  metronidazole (FLAGYL) IVPB 500 mg  Status:  Discontinued     500 mg 100 mL/hr over 60 Minutes Intravenous To Surgery 07/12/18 1427 07/12/18 1608   07/12/18 1430  cefTRIAXone (ROCEPHIN) 2 g in sodium chloride 0.9 % 100 mL IVPB  Status:  Discontinued     2 g 200 mL/hr over 30 Minutes Intravenous To Surgery 07/12/18 1427 07/12/18 1608   07/12/18 1245  tobramycin (NEBCIN) powder  Status:  Discontinued       As needed 07/12/18 1245 07/12/18  1542   07/12/18 1243  vancomycin (VANCOCIN) powder  Status:  Discontinued       As needed 07/12/18 1244 07/12/18 1542   07/11/18 1330  cefTRIAXone (ROCEPHIN) 2 g in sodium chloride 0.9 % 100 mL IVPB  Status:  Discontinued     2 g 200 mL/hr over 30 Minutes Intravenous Every 24 hours 07/11/18 1259 07/17/18 0810   07/11/18 1300  metroNIDAZOLE (FLAGYL) IVPB 500 mg  Status:  Discontinued     500 mg 100 mL/hr over 60 Minutes Intravenous Every 8 hours 07/11/18 1259 07/17/18 0810   07/11/18 0400  ceFAZolin (ANCEF) IVPB 2g/100 mL premix  Status:  Discontinued     2 g 200 mL/hr over 30 Minutes Intravenous Every 8 hours 07/10/18 2109 07/11/18 1259   07/10/18 1815  ceFAZolin (ANCEF) IVPB 2g/100 mL premix  Status:  Discontinued     2 g 200 mL/hr over 30 Minutes Intravenous  Once 07/10/18 1814 07/10/18 2339      Best Practice/Protocols:  VTE Prophylaxis: Lovenox (prophylaxtic dose) Intermittent Sedation  Consults: Treatment Team:  Md, Trauma, MD Haddix, Gillie Manners, MD Dillingham, Alena Bills, DO     Studies:    Events:  Subjective:    Overnight Issues:   Objective:  Vital signs for last 24 hours: Temp:  [98.5 F (36.9 C)-99.9 F (37.7 C)] 99.5 F (37.5 C) (04/24 0400) Pulse Rate:  [101-126] 118 (04/24 0700) Resp:  [16-35] 18 (04/24 0700) BP: (103-128)/(56-84) 117/79 (04/24 0700) SpO2:  [96 %-100 %] 100 % (04/24 0700) FiO2 (%):  [30 %] 30 % (04/24 0700)  Hemodynamic parameters for last 24 hours:    Intake/Output from previous day: 04/23 0701 - 04/24 0700 In: 4500 [I.V.:40; NG/GT:3180; IV Piggyback:400] Out: 2675 [Urine:2625; Stool:50]  Intake/Output this shift: No intake/output data recorded.  Vent settings for last 24 hours: Vent Mode: PRVC FiO2 (%):  [30 %] 30 % Set Rate:  [18 bmp] 18 bmp Vt Set:  [650 mL] 650 mL PEEP:  [5 cmH20] 5 cmH20 Pressure Support:  [10 cmH20] 10 cmH20 Plateau Pressure:  [18 cmH20-20 cmH20] 20 cmH20  Physical Exam:  General: on vent Neuro: awakens and F/C HEENT/Neck: trach-clean, intact Resp: clear to auscultation bilaterally CVS: RRR GI: soft, PEG, stoma, SP tube Extremities: edema 1+  Results for orders placed or performed during the hospital encounter of 07/10/18 (from the past 24 hour(s))  Glucose, capillary     Status: Abnormal   Collection Time: 10/04/18 12:30 PM  Result Value Ref Range   Glucose-Capillary 179 (H) 70 - 99 mg/dL  Glucose, capillary     Status:

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
## 2018-10-06 ENCOUNTER — Inpatient Hospital Stay (HOSPITAL_COMMUNITY): Payer: No Typology Code available for payment source

## 2018-10-06 LAB — BASIC METABOLIC PANEL
Anion gap: 15 (ref 5–15)
Anion gap: 4 — ABNORMAL LOW (ref 5–15)
BUN: 21 mg/dL — ABNORMAL HIGH (ref 6–20)
BUN: 19 mg/dL (ref 6–20)
CO2: 21 mmol/L — ABNORMAL LOW (ref 22–32)
CO2: 25 mmol/L (ref 22–32)
Calcium: 7.4 mg/dL — ABNORMAL LOW (ref 8.9–10.3)
Calcium: 8.9 mg/dL (ref 8.9–10.3)
Chloride: 119 mmol/L — ABNORMAL HIGH (ref 98–111)
Chloride: 111 mmol/L (ref 98–111)
Creatinine, Ser: <0.3 mg/dL — ABNORMAL LOW (ref 0.61–1.24)
Creatinine, Ser: <0.3 mg/dL — ABNORMAL LOW (ref 0.61–1.24)
Glucose, Bld: 120 mg/dL — ABNORMAL HIGH (ref 70–99)
Glucose, Bld: 146 mg/dL — ABNORMAL HIGH (ref 70–99)
Potassium: 2.7 mmol/L — CL (ref 3.5–5.1)
Potassium: 3.7 mmol/L (ref 3.5–5.1)
Sodium: 155 mmol/L — ABNORMAL HIGH (ref 135–145)
Sodium: 140 mmol/L (ref 135–145)

## 2018-10-06 LAB — URINALYSIS, COMPLETE (UACMP) WITH MICROSCOPIC
Bilirubin Urine: NEGATIVE
Glucose, UA: NEGATIVE mg/dL
Ketones, ur: NEGATIVE mg/dL
Nitrite: NEGATIVE
Protein, ur: NEGATIVE mg/dL
RBC / HPF: >50 RBC/hpf — ABNORMAL HIGH (ref 0–5)
Specific Gravity, Urine: 1.013 (ref 1.005–1.030)
pH: 7 (ref 5.0–8.0)

## 2018-10-06 LAB — GLUCOSE, CAPILLARY
Glucose-Capillary: 102 mg/dL — ABNORMAL HIGH (ref 70–99)
Glucose-Capillary: 114 mg/dL — ABNORMAL HIGH (ref 70–99)
Glucose-Capillary: 99 mg/dL (ref 70–99)
Glucose-Capillary: 158 mg/dL — ABNORMAL HIGH (ref 70–99)
Glucose-Capillary: 144 mg/dL — ABNORMAL HIGH (ref 70–99)
Glucose-Capillary: 156 mg/dL — ABNORMAL HIGH (ref 70–99)

## 2018-10-06 LAB — CBC
HCT: 25.2 % — ABNORMAL LOW (ref 39.0–52.0)
Hemoglobin: 7.4 g/dL — ABNORMAL LOW (ref 13.0–17.0)
MCH: 31.8 pg (ref 26.0–34.0)
MCHC: 29.4 g/dL — ABNORMAL LOW (ref 30.0–36.0)
MCV: 108.2 fL — ABNORMAL HIGH (ref 80.0–100.0)
Platelets: 373 10*3/uL (ref 150–400)
RBC: 2.33 MIL/uL — ABNORMAL LOW (ref 4.22–5.81)
RDW: 19.9 % — ABNORMAL HIGH (ref 11.5–15.5)
WBC: 6.5 10*3/uL (ref 4.0–10.5)
nRBC: 0 % (ref 0.0–0.2)

## 2018-10-06 MED ORDER — NYSTATIN 100000 UNIT/ML MT SUSP
5.0000 mL | Freq: Four times a day (QID) | OROMUCOSAL | Status: DC
  Administered 2018-10-06 – 2018-10-16 (×39): 500000 [IU] via ORAL
  Filled 2018-10-06 (×37): qty 5

## 2018-10-06 NOTE — Progress Notes (Signed)
Assisted tele visit to patient with sister  Gwendolyne Welford P, RN  

## 2018-10-06 NOTE — Progress Notes (Signed)
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 mL/hr over 30 Minutes Intravenous Every 8 hours 08/22/18 1027 08/23/18 1122   08/21/18 1400  ceFEPIme (MAXIPIME) 1 g in sodium chloride 0.9 % 100 mL IVPB  Status:  Discontinued     1 g 200 mL/hr over 30 Minutes Intravenous Every 8 hours 08/21/18 1042 08/22/18 0836   08/19/18 1030  cefTRIAXone (ROCEPHIN) 2 g in sodium chloride 0.9 % 100 mL IVPB  Status:  Discontinued     2 g 200 mL/hr over 30 Minutes Intravenous Every 24 hours 08/19/18 1017 08/21/18 1042   08/13/18 1400  piperacillin-tazobactam (ZOSYN) IVPB 3.375 g  Status:  Discontinued     3.375 g 12.5 mL/hr over 240  Minutes Intravenous Every 8 hours 08/13/18 0928 08/16/18 0854   08/13/18 1354  polymyxin B 500,000 Units, bacitracin 50,000 Units in sodium chloride 0.9 % 500 mL irrigation  Status:  Discontinued  As needed 08/13/18 1354 08/13/18 1538   08/13/18 0930  piperacillin-tazobactam (ZOSYN) IVPB 3.375 g     3.375 g 100 mL/hr over 30 Minutes Intravenous  Once 08/13/18 0928 08/13/18 1200   08/06/18 0801  polymyxin B 500,000 Units, bacitracin 50,000 Units in sodium chloride 0.9 % 500 mL irrigation  Status:  Discontinued       As needed 08/06/18 0803 08/06/18 0951   08/06/18 0600  ceFAZolin (ANCEF) IVPB 2g/100 mL premix  Status:  Discontinued     2 g 200 mL/hr over 30 Minutes Intravenous On call to O.R. 08/05/18 2241 08/05/18 2241   08/06/18 0600  ceFAZolin (ANCEF) IVPB 2g/100 mL premix  Status:  Discontinued     2 g 200 mL/hr over 30 Minutes Intravenous To Short Stay 08/05/18 2241 08/06/18 1024   08/01/18 2200  Ampicillin-Sulbactam (UNASYN) 3 g in sodium chloride 0.9 % 100 mL IVPB     3 g 200 mL/hr over 30 Minutes Intravenous Every 6 hours 08/01/18 2146 08/08/18 2235   08/01/18 1630  piperacillin-tazobactam (ZOSYN) IVPB 3.375 g  Status:  Discontinued     3.375 g 12.5 mL/hr over 240 Minutes Intravenous Every 8 hours 08/01/18 1624 08/01/18 2139   07/25/18 1508  polymyxin B 500,000 Units, bacitracin 50,000 Units in sodium chloride 0.9 % 500 mL irrigation  Status:  Discontinued       As needed 07/25/18 1509 07/25/18 1750   07/25/18 1445  piperacillin-tazobactam (ZOSYN) IVPB 3.375 g     3.375 g 100 mL/hr over 30 Minutes Intravenous STAT 07/25/18 1443 07/25/18 1620   07/25/18 0600  ceFAZolin (ANCEF) 3 g in dextrose 5 % 50 mL IVPB  Status:  Discontinued     3 g 100 mL/hr over 30 Minutes Intravenous To ShortStay Surgical 07/24/18 1735 07/25/18 1753   07/18/18 1444  polymyxin B 500,000 Units, bacitracin 50,000 Units in sodium chloride 0.9 % 500 mL irrigation  Status:  Discontinued       As needed  07/18/18 1445 07/18/18 1738   07/17/18 0900  piperacillin-tazobactam (ZOSYN) IVPB 3.375 g  Status:  Discontinued     3.375 g 12.5 mL/hr over 240 Minutes Intravenous Every 8 hours 07/17/18 0810 07/27/18 0806   07/12/18 1430  metronidazole (FLAGYL) IVPB 500 mg  Status:  Discontinued     500 mg 100 mL/hr over 60 Minutes Intravenous To Surgery 07/12/18 1427 07/12/18 1608   07/12/18 1430  cefTRIAXone (ROCEPHIN) 2 g in sodium chloride 0.9 % 100 mL IVPB  Status:  Discontinued     2 g 200 mL/hr over 30 Minutes Intravenous To Surgery 07/12/18 1427 07/12/18 1608   07/12/18 1245  tobramycin (NEBCIN) powder  Status:  Discontinued       As needed 07/12/18 1245 07/12/18 1542   07/12/18 1243  vancomycin (VANCOCIN) powder  Status:  Discontinued       As needed 07/12/18 1244 07/12/18 1542   07/11/18 1330  cefTRIAXone (ROCEPHIN) 2 g in sodium chloride 0.9 % 100 mL IVPB  Status:  Discontinued     2 g 200 mL/hr over 30 Minutes Intravenous Every 24 hours 07/11/18 1259 07/17/18 0810   07/11/18 1300  metroNIDAZOLE (FLAGYL) IVPB 500 mg  Status:  Discontinued     500 mg 100 mL/hr over 60 Minutes Intravenous Every 8 hours 07/11/18 1259 07/17/18 0810   07/11/18 0400  ceFAZolin (ANCEF) IVPB 2g/100 mL premix  Status:  Discontinued     2 g 200 mL/hr over 30 Minutes Intravenous Every  8 hours 07/10/18 2109 07/11/18 1259   07/10/18 1815  ceFAZolin (ANCEF) IVPB 2g/100 mL premix  Status:  Discontinued     2 g 200 mL/hr over 30 Minutes Intravenous  Once 07/10/18 1814 07/10/18 2339

## 2018-10-06 NOTE — Progress Notes (Signed)
Patient back to full support on ventilator because of increased RR and HR. Patient had increased WOB. Patient calming back down on full support. RT will continue to monitor.

## 2018-10-07 LAB — BASIC METABOLIC PANEL
Anion gap: 6 (ref 5–15)
Anion gap: 7 (ref 5–15)
BUN: 19 mg/dL (ref 6–20)
BUN: 19 mg/dL (ref 6–20)
CO2: 24 mmol/L (ref 22–32)
CO2: 27 mmol/L (ref 22–32)
Calcium: 8.7 mg/dL — ABNORMAL LOW (ref 8.9–10.3)
Calcium: 9.3 mg/dL (ref 8.9–10.3)
Chloride: 112 mmol/L — ABNORMAL HIGH (ref 98–111)
Chloride: 109 mmol/L (ref 98–111)
Creatinine, Ser: <0.3 mg/dL — ABNORMAL LOW (ref 0.61–1.24)
Creatinine, Ser: <0.3 mg/dL — ABNORMAL LOW (ref 0.61–1.24)
Glucose, Bld: 137 mg/dL — ABNORMAL HIGH (ref 70–99)
Glucose, Bld: 130 mg/dL — ABNORMAL HIGH (ref 70–99)
Potassium: 3.7 mmol/L (ref 3.5–5.1)
Potassium: 3.9 mmol/L (ref 3.5–5.1)
Sodium: 142 mmol/L (ref 135–145)
Sodium: 143 mmol/L (ref 135–145)

## 2018-10-07 LAB — URINE CULTURE: Culture: NO GROWTH

## 2018-10-07 LAB — GLUCOSE, CAPILLARY
Glucose-Capillary: 159 mg/dL — ABNORMAL HIGH (ref 70–99)
Glucose-Capillary: 116 mg/dL — ABNORMAL HIGH (ref 70–99)
Glucose-Capillary: 146 mg/dL — ABNORMAL HIGH (ref 70–99)
Glucose-Capillary: 131 mg/dL — ABNORMAL HIGH (ref 70–99)
Glucose-Capillary: 129 mg/dL — ABNORMAL HIGH (ref 70–99)
Glucose-Capillary: 133 mg/dL — ABNORMAL HIGH (ref 70–99)

## 2018-10-07 LAB — CBC
HCT: 18.2 % — ABNORMAL LOW (ref 39.0–52.0)
HCT: 25.2 % — ABNORMAL LOW (ref 39.0–52.0)
Hemoglobin: 5.2 g/dL — CL (ref 13.0–17.0)
Hemoglobin: 7.4 g/dL — ABNORMAL LOW (ref 13.0–17.0)
MCH: 32.3 pg (ref 26.0–34.0)
MCH: 31.9 pg (ref 26.0–34.0)
MCHC: 28.6 g/dL — ABNORMAL LOW (ref 30.0–36.0)
MCHC: 29.4 g/dL — ABNORMAL LOW (ref 30.0–36.0)
MCV: 113 fL — ABNORMAL HIGH (ref 80.0–100.0)
MCV: 108.6 fL — ABNORMAL HIGH (ref 80.0–100.0)
Platelets: 245 10*3/uL (ref 150–400)
Platelets: 334 10*3/uL (ref 150–400)
RBC: 1.61 MIL/uL — ABNORMAL LOW (ref 4.22–5.81)
RBC: 2.32 MIL/uL — ABNORMAL LOW (ref 4.22–5.81)
RDW: 19.5 % — ABNORMAL HIGH (ref 11.5–15.5)
RDW: 19.2 % — ABNORMAL HIGH (ref 11.5–15.5)
WBC: 4.1 10*3/uL (ref 4.0–10.5)
WBC: 5.1 10*3/uL (ref 4.0–10.5)
nRBC: 0 % (ref 0.0–0.2)
nRBC: 0 % (ref 0.0–0.2)

## 2018-10-07 LAB — MAGNESIUM
Magnesium: 1.7 mg/dL (ref 1.7–2.4)
Magnesium: 1.8 mg/dL (ref 1.7–2.4)

## 2018-10-07 NOTE — Progress Notes (Signed)
Patient ID: Christopher [name], male   DOB: [birth year], 53 y.o.   MRN: [id number] Follow up - Trauma Critical Care  Patient Details:    Christopher [name] is an 53 y.o. male.  Lines/tubes : PICC Double Lumen 08/10/18 PICC Left Brachial 50 cm 1 cm (Active)  Indication for Insertion or Continuance of Line Prolonged intravenous therapies 10/06/2018  8:00 PM  Exposed Catheter (cm) 1 cm 08/10/2018  3:39 PM  Site Assessment Clean;Dry;Intact 10/06/2018  8:00 AM  Lumen #1 Status Infusing 10/06/2018  8:00 AM  Lumen #2 Status In-line blood sampling system in place 10/06/2018  8:00 AM  Dressing Type Transparent;Occlusive 10/06/2018  8:00 AM  Dressing Status Clean;Dry;Intact;Antimicrobial disc in place 10/06/2018  8:00 AM  Line Care Connections checked and tightened 10/06/2018  8:00 AM  Line Adjustment (NICU/IV Team Only) No 10/03/2018  8:00 PM  Dressing Intervention New dressing;Antimicrobial disc changed;Securement device changed;Dressing changed 10/05/2018  4:00 PM  Dressing Change Due 10/12/18 10/06/2018  8:00 AM     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)  Surrounding Skin Intact 10/07/2018  4:00 AM  Tube Status Patent 10/07/2018  4:00 AM  Drainage Appearance None 10/07/2018  4:00 AM  Dressing Status Clean;Dry;Intact 10/07/2018  4:00 AM  Dressing Intervention Dressing changed 10/06/2018  8:00 AM  Dressing Type Dry dressing 10/07/2018  4:00 AM  Dressing Change Due 09/16/18 09/27/2018  8:00 PM  G Port Intake (mL) 400 ml 10/06/2018  4:42 PM  J Port Intake (mL) 240 ml 10/06/2018 11:00 PM  Output (mL) 75 mL 09/06/2018  6:00 PM     Colostomy LLQ (Active)  Ostomy Pouch 2 piece 10/07/2018  4:00 AM  Stoma Assessment Pink 10/07/2018  4:00 AM  Peristomal Assessment Intact 10/07/2018  4:00 AM  Treatment Pouch change 10/06/2018  4:00 AM  Output (mL) 200 mL 10/06/2018  8:00 PM     Suprapubic Catheter Non-latex 14 Fr. (Active)  Site Assessment Clean;Intact 10/07/2018   4:00 AM  Dressing Status Clean;Dry 10/07/2018  4:00 AM  Dressing Type Dry dressing 10/07/2018  4:00 AM  Collection Container Standard drainage bag 10/07/2018  4:00 AM  Securement Method Sutured 10/07/2018  4:00 AM  Indication for Insertion or Continuance of Catheter Bladder outlet obstruction / other urologic reason 10/07/2018  4:00 AM  Output (mL) 200 mL 10/07/2018  5:00 AM    Microbiology/Sepsis markers: Results for orders placed or performed during the hospital encounter of 07/10/18  MRSA PCR Screening     Status: None   Collection Time: 07/11/18  1:47 AM  Result Value Ref Range Status   MRSA by PCR NEGATIVE NEGATIVE Final    Comment:        The GeneXpert MRSA Assay (FDA approved for NASAL specimens only), is one component of a comprehensive MRSA colonization surveillance program. It is not intended to diagnose MRSA infection nor to guide or monitor treatment for MRSA infections. Performed at [facility], [address] [place], [place] [address]   Surgical pcr screen     Status: None   Collection Time: 07/12/18  8:11 AM  Result Value Ref Range Status   MRSA, PCR NEGATIVE NEGATIVE Final   Staphylococcus aureus NEGATIVE NEGATIVE Final    Comment: (NOTE) The Xpert SA Assay (FDA approved for NASAL specimens in patients 29 years of age and older), is one component of a comprehensive surveillance program. It is not intended to diagnose infection nor to guide or monitor treatment. Performed at [facility], [address] [place], [place] [address]   Culture, blood (Routine X 2) w Reflex to ID Panel     Status: None   Collection Time: 07/17/18  8:40 AM  Result Value Ref Range Status   Specimen Description BLOOD RIGHT ARM  Final   Special Requests   Final    BOTTLES DRAWN AEROBIC AND ANAEROBIC Blood Culture adequate volume   Culture   Final    NO GROWTH 5 DAYS Performed at [facility], [address] [place], [place] [address]    Report Status 07/22/2018  FINAL  Final  Culture, blood (Routine X 2) w Reflex to ID Panel     Status: None   Collection Time: 07/17/18  8:52 AM  Result Value Ref Range Status   Specimen Description BLOOD RIGHT HAND  Final   Special Requests   Final    BOTTLES DRAWN AEROBIC ONLY Blood Culture adequate volume   Culture   Final    NO GROWTH 5 DAYS Performed at [facility], [address] [place], [place] [address]    Report Status 07/22/2018 FINAL  Final  Culture, blood (Routine X 2) w Reflex to ID Panel     Status: None   Collection Time: 07/30/18  8:50 AM  Result Value Ref Range Status   Specimen Description BLOOD LEFT HAND  Final   Special Requests   Final    BOTTLES DRAWN AEROBIC ONLY Blood Culture results may not be optimal due to an inadequate volume of blood received in culture bottles Performed at [facility], [address] [place], [place] [address]    Culture NO GROWTH 5 DAYS  Final   Report Status 08/04/2018 FINAL  Final  Culture, blood (Routine X 2) w Reflex to ID Panel     Status: None   Collection Time: 07/30/18  9:56 AM  Result Value Ref Range Status   Specimen Description BLOOD LEFT ARM  Final   Special Requests   Final    BOTTLES DRAWN AEROBIC ONLY Blood Culture results may not be optimal due to an inadequate volume of blood received in culture bottles Performed at [facility], [address] [place], [place] [address]    Culture NO GROWTH 5 DAYS  Final   Report Status 08/04/2018 FINAL  Final  Culture, respiratory (non-expectorated)     Status: None   Collection Time: 07/30/18 11:38 AM  Result Value Ref Range Status   Specimen Description TRACHEAL ASPIRATE  Final   Special Requests Normal  Final   Gram Stain   Final    RARE WBC PRESENT, PREDOMINANTLY PMN RARE GRAM POSITIVE COCCI Performed at [facility], [address] [place], [place] [address]    Culture   Final    MODERATE ACINETOBACTER CALCOACETICUS/BAUMANNII COMPLEX   Report Status 08/01/2018  FINAL  Final   Organism ID, Bacteria ACINETOBACTER CALCOACETICUS/BAUMANNII COMPLEX  Final      Susceptibility   Acinetobacter calcoaceticus/baumannii complex - MIC*    CEFTAZIDIME 8 SENSITIVE Sensitive     CEFTRIAXONE 32 INTERMEDIATE Intermediate     CIPROFLOXACIN <=0.25 SENSITIVE Sensitive     GENTAMICIN <=1 SENSITIVE Sensitive     IMIPENEM <=0.25 SENSITIVE Sensitive     PIP/TAZO <=4 SENSITIVE Sensitive     TRIMETH/SULFA <=20 SENSITIVE Sensitive     CEFEPIME 4 SENSITIVE Sensitive     AMPICILLIN/SULBACTAM <=2 SENSITIVE Sensitive     * MODERATE ACINETOBACTER CALCOACETICUS/BAUMANNII COMPLEX  Culture, respiratory (non-expectorated)     Status: None   Collection Time: 08/13/18  9:22 AM  Result Value Ref Range Status   Specimen Description TRACHEAL ASPIRATE  Final   Special Requests Normal  Final   Gram Stain   Final    FEW WBC PRESENT, PREDOMINANTLY PMN MODERATE GRAM POSITIVE COCCI MODERATE GRAM NEGATIVE RODS    Culture   Final    Consistent with normal respiratory flora. Performed at [facility], [address] [place], [place] [address]    Report Status 08/15/2018 FINAL  Final  Culture, blood (Routine X 2) w Reflex to ID Panel     Status: None   Collection Time: 08/13/18  4:23 PM  Result Value Ref Range Status   Specimen Description BLOOD FOOT  Final   Special Requests   Final    BOTTLES DRAWN AEROBIC ONLY Blood Culture results may not be optimal due to an inadequate volume of blood received in culture bottles   Culture   Final    NO GROWTH 5 DAYS Performed at [facility], [address], [place], [place] [address]    Report Status 08/18/2018 FINAL  Final  Culture, blood (Routine X 2) w Reflex to ID Panel     Status: None   Collection Time: 08/13/18  4:41 PM  Result Value Ref Range Status   Specimen Description BLOOD FOOT  Final   Special Requests   Final    BOTTLES DRAWN AEROBIC ONLY Blood Culture results may not be optimal due to an inadequate volume of  blood received in culture bottles   Culture   Final    NO GROWTH 5 DAYS Performed at [facility], [address], [place], [place] [address]    Report Status 08/18/2018 FINAL  Final  Surgical pcr screen     Status: None   Collection Time: 08/16/18  4:03 AM  Result Value Ref Range Status   MRSA, PCR NEGATIVE NEGATIVE Final   Staphylococcus aureus NEGATIVE NEGATIVE Final    Comment: (NOTE) The Xpert SA Assay (FDA approved for NASAL specimens in patients 50 years of age and older), is one component of a comprehensive surveillance program. It is not intended to diagnose infection nor to guide or monitor treatment. Performed at [facility], [address], [place], [place] [address]   Culture, blood (routine x 2)     Status: Abnormal   Collection Time: 08/18/18 11:50 AM  Result Value Ref Range Status   Specimen Description BLOOD RIGHT HAND  Final   Special Requests   Final    BOTTLES DRAWN AEROBIC ONLY Blood Culture adequate volume   Culture  Setup Time   Final    GRAM NEGATIVE RODS AEROBIC BOTTLE ONLY CRITICAL RESULT CALLED TO, READ BACK BY AND VERIFIED WITH: V. BRYK,PHARMD [id number] 08/19/2018 T. TYSOR    Culture (A)  Final    SERRATIA MARCESCENS PSEUDOMONAS PUTIDA CRITICAL RESULT CALLED TO, READ BACK BY AND VERIFIED WITH: PHARMD M LORI [id number] AT 757 AM BY CM Performed at [facility], [address] [place], [place] [address]    Report Status 08/22/2018 FINAL  Final   Organism ID, Bacteria SERRATIA MARCESCENS  Final   Organism ID, Bacteria PSEUDOMONAS PUTIDA  Final      Susceptibility   Pseudomonas putida - MIC*    CEFTAZIDIME 16 INTERMEDIATE Intermediate     CIPROFLOXACIN 0.5 SENSITIVE Sensitive     GENTAMICIN <=1 SENSITIVE Sensitive  IMIPENEM 2 SENSITIVE Sensitive     PIP/TAZO >=128 RESISTANT Resistant     CEFEPIME 8 SENSITIVE Sensitive     * PSEUDOMONAS PUTIDA   Serratia marcescens - MIC*    CEFAZOLIN >=64 RESISTANT Resistant     CEFEPIME <=1  SENSITIVE Sensitive     CEFTAZIDIME <=1 SENSITIVE Sensitive     CEFTRIAXONE <=1 SENSITIVE Sensitive     CIPROFLOXACIN <=0.25 SENSITIVE Sensitive     GENTAMICIN <=1 SENSITIVE Sensitive     TRIMETH/SULFA <=20 SENSITIVE Sensitive     * SERRATIA MARCESCENS  Blood Culture ID Panel (Reflexed)     Status: Abnormal   Collection Time: 08/18/18 11:50 AM  Result Value Ref Range Status   Enterococcus species NOT DETECTED NOT DETECTED Final   Listeria monocytogenes NOT DETECTED NOT DETECTED Final   Staphylococcus species NOT DETECTED NOT DETECTED Final   Staphylococcus aureus (BCID) NOT DETECTED NOT DETECTED Final   Streptococcus species NOT DETECTED NOT DETECTED Final   Streptococcus agalactiae NOT DETECTED NOT DETECTED Final   Streptococcus pneumoniae NOT DETECTED NOT DETECTED Final   Streptococcus pyogenes NOT DETECTED NOT DETECTED Final   Acinetobacter baumannii NOT DETECTED NOT DETECTED Final   Enterobacteriaceae species DETECTED (A) NOT DETECTED Final    Comment: Enterobacteriaceae represent a large family of gram-negative bacteria, not a single organism. CRITICAL RESULT CALLED TO, READ BACK BY AND VERIFIED WITH: V. Joan Mayans [phone] 08/19/2018 T. TYSOR    Enterobacter cloacae complex NOT DETECTED NOT DETECTED Final   Escherichia coli NOT DETECTED NOT DETECTED Final   Klebsiella oxytoca NOT DETECTED NOT DETECTED Final   Klebsiella pneumoniae NOT DETECTED NOT DETECTED Final   Proteus species NOT DETECTED NOT DETECTED Final   Serratia marcescens DETECTED (A) NOT DETECTED Final    Comment: CRITICAL RESULT CALLED TO, READ BACK BY AND VERIFIED WITH: VJoan Mayans [id number] 08/19/2018 T. TYSOR    Carbapenem resistance NOT DETECTED NOT DETECTED Final   Haemophilus influenzae NOT DETECTED NOT DETECTED Final   Neisseria meningitidis NOT DETECTED NOT DETECTED Final   Pseudomonas aeruginosa NOT DETECTED NOT DETECTED Final   Candida albicans NOT DETECTED NOT DETECTED Final   Candida glabrata NOT  DETECTED NOT DETECTED Final   Candida krusei NOT DETECTED NOT DETECTED Final   Candida parapsilosis NOT DETECTED NOT DETECTED Final   Candida tropicalis NOT DETECTED NOT DETECTED Final    Comment: Performed at [facility], [address], [place], [place] [address]  Culture, blood (routine x 2)     Status: None   Collection Time: 08/18/18 11:58 AM  Result Value Ref Range Status   Specimen Description BLOOD RIGHT ANTECUBITAL  Final   Special Requests   Final    BOTTLES DRAWN AEROBIC ONLY Blood Culture adequate volume   Culture   Final    NO GROWTH 5 DAYS Performed at [facility], [address], [place], [place] [address]    Report Status 08/23/2018 FINAL  Final  MRSA PCR Screening     Status: None   Collection Time: 09/21/18  1:30 PM  Result Value Ref Range Status   MRSA by PCR NEGATIVE NEGATIVE Final    Comment:        The GeneXpert MRSA Assay (FDA approved for NASAL specimens only), is one component of a comprehensive MRSA colonization surveillance program. It is not intended to diagnose MRSA infection nor to guide or monitor treatment for MRSA infections. Performed at [facility], [address], [place], [place] [address]  Culture, blood (routine x 2)     Status: Abnormal   Collection Time: 09/24/18  3:55 PM  Result Value Ref Range Status   Specimen Description BLOOD RIGHT ANTECUBITAL  Final   Special Requests   Final    BOTTLES DRAWN AEROBIC ONLY Blood Culture adequate volume   Culture  Setup Time   Final    GRAM POSITIVE COCCI IN CLUSTERS AEROBIC BOTTLE ONLY CRITICAL RESULT CALLED TO, READ BACK BY AND VERIFIED WITH: J [place] PHARMD 09/25/18 1751 JDW    Culture (A)  Final    STAPHYLOCOCCUS SPECIES (COAGULASE NEGATIVE) THE SIGNIFICANCE OF ISOLATING THIS ORGANISM FROM A SINGLE SET OF BLOOD CULTURES WHEN MULTIPLE SETS ARE DRAWN IS UNCERTAIN. PLEASE NOTIFY THE MICROBIOLOGY DEPARTMENT WITHIN ONE WEEK IF SPECIATION AND SENSITIVITIES ARE  REQUIRED. Performed at [facility], [address] [place], [place] [address]    Report Status 09/26/2018 FINAL  Final  Blood Culture ID Panel (Reflexed)     Status: Abnormal   Collection Time: 09/24/18  3:55 PM  Result Value Ref Range Status   Enterococcus species NOT DETECTED NOT DETECTED Final   Listeria monocytogenes NOT DETECTED NOT DETECTED Final   Staphylococcus species DETECTED (A) NOT DETECTED Final    Comment: Methicillin (oxacillin) resistant coagulase negative staphylococcus. Possible blood culture contaminant (unless isolated from more than one blood culture draw or clinical case suggests pathogenicity). No antibiotic treatment is indicated for blood  culture contaminants. CRITICAL RESULT CALLED TO, READ BACK BY AND VERIFIED WITH: J [place] PHARMD 09/25/18 1751 JDW    Staphylococcus aureus (BCID) NOT DETECTED NOT DETECTED Final   Methicillin resistance DETECTED (A) NOT DETECTED Final    Comment: CRITICAL RESULT CALLED TO, READ BACK BY AND VERIFIED WITH: J [place] PHARMD 09/25/18 1751 JDW    Streptococcus species NOT DETECTED NOT DETECTED Final   Streptococcus agalactiae NOT DETECTED NOT DETECTED Final   Streptococcus pneumoniae NOT DETECTED NOT DETECTED Final   Streptococcus pyogenes NOT DETECTED NOT DETECTED Final   Acinetobacter baumannii NOT DETECTED NOT DETECTED Final   Enterobacteriaceae species NOT DETECTED NOT DETECTED Final   Enterobacter cloacae complex NOT DETECTED NOT DETECTED Final   Escherichia coli NOT DETECTED NOT DETECTED Final   Klebsiella oxytoca NOT DETECTED NOT DETECTED Final   Klebsiella pneumoniae NOT DETECTED NOT DETECTED Final   Proteus species NOT DETECTED NOT DETECTED Final   Serratia marcescens NOT DETECTED NOT DETECTED Final   Haemophilus influenzae NOT DETECTED NOT DETECTED Final   Neisseria meningitidis NOT DETECTED NOT DETECTED Final   Pseudomonas aeruginosa NOT DETECTED NOT DETECTED Final   Candida albicans NOT DETECTED NOT DETECTED  Final   Candida glabrata NOT DETECTED NOT DETECTED Final   Candida krusei NOT DETECTED NOT DETECTED Final   Candida parapsilosis NOT DETECTED NOT DETECTED Final   Candida tropicalis NOT DETECTED NOT DETECTED Final    Comment: Performed at [facility], [address] [place], [place] [address]  Culture, blood (routine x 2)     Status: None   Collection Time: 09/24/18  3:59 PM  Result Value Ref Range Status   Specimen Description BLOOD HAND  Final   Special Requests   Final    BOTTLES DRAWN AEROBIC ONLY Blood Culture adequate volume   Culture   Final    NO GROWTH 5 DAYS Performed at [facility], [address] [place], [place] [address]    Report Status 09/29/2018 FINAL  Final  Culture, Urine  Status: Abnormal   Collection Time: 09/24/18  4:22 PM  Result Value Ref Range Status   Specimen Description URINE, RANDOM  Final   Special Requests   Final    NONE Performed at [facility], [address] [place], [place] [address]    Culture >=100,000 COLONIES/mL ENTEROCOCCUS FAECALIS (A)  Final   Report Status 09/26/2018 FINAL  Final   Organism ID, Bacteria ENTEROCOCCUS FAECALIS (A)  Final      Susceptibility   Enterococcus faecalis - MIC*    AMPICILLIN <=2 SENSITIVE Sensitive     LEVOFLOXACIN >=8 RESISTANT Resistant     NITROFURANTOIN <=16 SENSITIVE Sensitive     VANCOMYCIN 1 SENSITIVE Sensitive     * >=100,000 COLONIES/mL ENTEROCOCCUS FAECALIS  Culture, Urine     Status: None   Collection Time: 09/28/18  6:53 PM  Result Value Ref Range Status   Specimen Description URINE, CATHETERIZED  Final   Special Requests Immunocompromised  Final   Culture   Final    NO GROWTH Performed at [facility], [address] [place], [place] [address]    Report Status 09/29/2018 FINAL  Final  Culture, blood (routine x 2)     Status: None (Preliminary result)   Collection Time: 10/06/18  9:55 AM  Result Value Ref Range Status   Specimen Description BLOOD RIGHT  FOREARM  Final   Special Requests   Final    AEROBIC BOTTLE ONLY Blood Culture results may not be optimal due to an excessive volume of blood received in culture bottles   Culture   Final    NO GROWTH < 24 HOURS Performed at [facility], [address] [place], [place] [address]    Report Status PENDING  Incomplete  Culture, blood (routine x 2)     Status: None (Preliminary result)   Collection Time: 10/06/18 10:00 AM  Result Value Ref Range Status   Specimen Description BLOOD RIGHT ANTECUBITAL  Final   Special Requests   Final    AEROBIC BOTTLE ONLY Blood Culture results may not be optimal due to an excessive volume of blood received in culture bottles   Culture   Final    NO GROWTH < 24 HOURS Performed at [facility], [address], [place], [place] [address]    Report Status PENDING  Incomplete    Anti-infectives:  Anti-infectives (From admission, onward)   Start     Dose/Rate Route Frequency Ordered Stop   10/03/18 0000  ceFAZolin (ANCEF) IVPB 2g/100 mL premix     2 g 200 mL/hr over 30 Minutes Intravenous To Surgery 10/02/18 1822 10/03/18 1348   09/27/18 0830  ampicillin (OMNIPEN) 2 g in sodium chloride 0.9 % 100 mL IVPB  Status:  Discontinued     2 g 300 mL/hr over 20 Minutes Intravenous Every 6 hours 09/27/18 0822 10/05/18 0900   09/25/18 2000  vancomycin (VANCOCIN) 1,250 mg in sodium chloride 0.9 % 250 mL IVPB  Status:  Discontinued     1,250 mg 166.7 mL/hr over 90 Minutes Intravenous Every 12 hours 09/25/18 1854 09/27/18 0822   09/25/18 0800  ceFEPIme (MAXIPIME) 2 g in sodium chloride 0.9 % 100 mL IVPB  Status:  Discontinued     2 g 200 mL/hr over 30 Minutes Intravenous Every 8 hours 09/25/18 0750 09/27/18 0822   09/19/18 1257  polymyxin B 500,000 Units, bacitracin 50,000 Units in sodium chloride 0.9 % 500 mL irrigation  Status:  Discontinued  As needed 09/19/18 1257 09/19/18 1608   09/19/18 1100  ceFAZolin (ANCEF) IVPB 2g/100 mL premix     2 g 200  mL/hr over 30 Minutes Intravenous To Surgery 09/18/18 2055 09/19/18 1334   08/29/18 1512  polymyxin B 500,000 Units, bacitracin 50,000 Units in sodium chloride 0.9 % 500 mL irrigation  Status:  Discontinued       As needed 08/29/18 1512 08/29/18 1721   08/23/18 1200  meropenem (MERREM) 1 g in sodium chloride 0.9 % 100 mL IVPB     1 g 200 mL/hr over 30 Minutes Intravenous Every 8 hours 08/23/18 1122 09/05/18 1311   08/22/18 1400  ceFEPIme (MAXIPIME) 2 g in sodium chloride 0.9 % 100 mL IVPB  Status:  Discontinued     2 g 200 mL/hr over 30 Minutes Intravenous Every 8 hours 08/22/18 0836 08/22/18 1027   08/22/18 1200  meropenem (MERREM) 2 g in sodium chloride 0.9 % 100 mL IVPB  Status:  Discontinued     2 g 200 mL/hr over 30 Minutes Intravenous Every 8 hours 08/22/18 1027 08/23/18 1122   08/21/18 1400  ceFEPIme (MAXIPIME) 1 g in sodium chloride 0.9 % 100 mL IVPB  Status:  Discontinued     1 g 200 mL/hr over 30 Minutes Intravenous Every 8 hours 08/21/18 1042 08/22/18 0836   08/19/18 1030  cefTRIAXone (ROCEPHIN) 2 g in sodium chloride 0.9 % 100 mL IVPB  Status:  Discontinued     2 g 200 mL/hr over 30 Minutes Intravenous Every 24 hours 08/19/18 1017 08/21/18 1042   08/13/18 1400  piperacillin-tazobactam (ZOSYN) IVPB 3.375 g  Status:  Discontinued     3.375 g 12.5 mL/hr over 240 Minutes Intravenous Every 8 hours 08/13/18 0928 08/16/18 0854   08/13/18 1354  polymyxin B 500,000 Units, bacitracin 50,000 Units in sodium chloride 0.9 % 500 mL irrigation  Status:  Discontinued       As needed 08/13/18 1354 08/13/18 1538   08/13/18 0930  piperacillin-tazobactam (ZOSYN) IVPB 3.375 g     3.375 g 100 mL/hr over 30 Minutes Intravenous  Once 08/13/18 0928 08/13/18 1200   08/06/18 0801  polymyxin B 500,000 Units, bacitracin 50,000 Units in sodium chloride 0.9 % 500 mL irrigation  Status:  Discontinued       As needed 08/06/18 0803 08/06/18 0951   08/06/18 0600  ceFAZolin (ANCEF) IVPB 2g/100 mL premix  Status:   Discontinued     2 g 200 mL/hr over 30 Minutes Intravenous On call to O.R. 08/05/18 2241 08/05/18 2241   08/06/18 0600  ceFAZolin (ANCEF) IVPB 2g/100 mL premix  Status:  Discontinued     2 g 200 mL/hr over 30 Minutes Intravenous To Short Stay 08/05/18 2241 08/06/18 1024   08/01/18 2200  Ampicillin-Sulbactam (UNASYN) 3 g in sodium chloride 0.9 % 100 mL IVPB     3 g 200 mL/hr over 30 Minutes Intravenous Every 6 hours 08/01/18 2146 08/08/18 2235   08/01/18 1630  piperacillin-tazobactam (ZOSYN) IVPB 3.375 g  Status:  Discontinued     3.375 g 12.5 mL/hr over 240 Minutes Intravenous Every 8 hours 08/01/18 1624 08/01/18 2139   07/25/18 1508  polymyxin B 500,000 Units, bacitracin 50,000 Units in sodium chloride 0.9 % 500 mL irrigation  Status:  Discontinued       As needed 07/25/18 1509 07/25/18 1750   07/25/18 1445  piperacillin-tazobactam (ZOSYN) IVPB 3.375 g     3.375 g 100 mL/hr over 30 Minutes Intravenous STAT 07/25/18  1443 07/25/18 1620   07/25/18 0600  ceFAZolin (ANCEF) 3 g in dextrose 5 % 50 mL IVPB  Status:  Discontinued     3 g 100 mL/hr over 30 Minutes Intravenous To ShortStay Surgical 07/24/18 1735 07/25/18 1753   07/18/18 1444  polymyxin B 500,000 Units, bacitracin 50,000 Units in sodium chloride 0.9 % 500 mL irrigation  Status:  Discontinued       As needed 07/18/18 1445 07/18/18 1738   07/17/18 0900  piperacillin-tazobactam (ZOSYN) IVPB 3.375 g  Status:  Discontinued     3.375 g 12.5 mL/hr over 240 Minutes Intravenous Every 8 hours 07/17/18 0810 07/27/18 0806   07/12/18 1430  metronidazole (FLAGYL) IVPB 500 mg  Status:  Discontinued     500 mg 100 mL/hr over 60 Minutes Intravenous To Surgery 07/12/18 1427 07/12/18 1608   07/12/18 1430  cefTRIAXone (ROCEPHIN) 2 g in sodium chloride 0.9 % 100 mL IVPB  Status:  Discontinued     2 g 200 mL/hr over 30 Minutes Intravenous To Surgery 07/12/18 1427 07/12/18 1608   07/12/18 1245  tobramycin (NEBCIN) powder  Status:  Discontinued        As needed 07/12/18 1245 07/12/18 1542   07/12/18 1243  vancomycin (VANCOCIN) powder  Status:  Discontinued       As needed 07/12/18 1244 07/12/18 1542   07/11/18 1330  cefTRIAXone (ROCEPHIN) 2 g in sodium chloride 0.9 % 100 mL IVPB  Status:  Discontinued     2 g 200 mL/hr over 30 Minutes Intravenous Every 24 hours 07/11/18 1259 07/17/18 0810   07/11/18 1300  metroNIDAZOLE (FLAGYL) IVPB 500 mg  Status:  Discontinued     500 mg 100 mL/hr over 60 Minutes Intravenous Every 8 hours 07/11/18 1259 07/17/18 0810   07/11/18 0400  ceFAZolin (ANCEF) IVPB 2g/100 mL premix  Status:  Discontinued     2 g 200 mL/hr over 30 Minutes Intravenous Every 8 hours 07/10/18 2109 07/11/18 1259   07/10/18 1815  ceFAZolin (ANCEF) IVPB 2g/100 mL premix  Status:  Discontinued     2 g 200 mL/hr over 30 Minutes Intravenous  Once 07/10/18 1814 07/10/18 2339      Best Practice/Protocols:  VTE Prophylaxis: Lovenox (prophylaxtic dose)  Consults: Treatment Team:  Md, Trauma, MD Haddix, Gillie Manners, MD Dillingham, Alena Bills, DO   Subjective:    Overnight Issues:   Objective:  Vital signs for last 24 hours: Temp:  [98.2 F (36.8 C)-101.6 F (38.7 C)] 100.6 F (38.1 C) (04/26 0400) Pulse Rate:  [100-136] 116 (04/26 0747) Resp:  [17-30] 22 (04/26 0747) BP: (93-148)/(53-81) 112/71 (04/26 0747) SpO2:  [95 %-100 %] 99 % (04/26 0747) FiO2 (%):  [30 %] 30 % (04/26 0747)  Hemodynamic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

## 2018-10-08 LAB — CBC
HCT: 25.6 % — ABNORMAL LOW (ref 39.0–52.0)
Hemoglobin: 7.7 g/dL — ABNORMAL LOW (ref 13.0–17.0)
MCH: 32.6 pg (ref 26.0–34.0)
MCHC: 30.1 g/dL (ref 30.0–36.0)
MCV: 108.5 fL — ABNORMAL HIGH (ref 80.0–100.0)
Platelets: 397 10*3/uL (ref 150–400)
RBC: 2.36 MIL/uL — ABNORMAL LOW (ref 4.22–5.81)
RDW: 18.5 % — ABNORMAL HIGH (ref 11.5–15.5)
WBC: 5.2 10*3/uL (ref 4.0–10.5)
nRBC: 0 % (ref 0.0–0.2)

## 2018-10-08 LAB — BASIC METABOLIC PANEL
Anion gap: 9 (ref 5–15)
BUN: 15 mg/dL (ref 6–20)
CO2: 24 mmol/L (ref 22–32)
Calcium: 8.9 mg/dL (ref 8.9–10.3)
Chloride: 107 mmol/L (ref 98–111)
Creatinine, Ser: <0.3 mg/dL — ABNORMAL LOW (ref 0.61–1.24)
Glucose, Bld: 122 mg/dL — ABNORMAL HIGH (ref 70–99)
Potassium: 3.8 mmol/L (ref 3.5–5.1)
Sodium: 140 mmol/L (ref 135–145)

## 2018-10-08 LAB — GLUCOSE, CAPILLARY
Glucose-Capillary: 122 mg/dL — ABNORMAL HIGH (ref 70–99)
Glucose-Capillary: 84 mg/dL (ref 70–99)
Glucose-Capillary: 127 mg/dL — ABNORMAL HIGH (ref 70–99)
Glucose-Capillary: 146 mg/dL — ABNORMAL HIGH (ref 70–99)
Glucose-Capillary: 108 mg/dL — ABNORMAL HIGH (ref 70–99)
Glucose-Capillary: 150 mg/dL — ABNORMAL HIGH (ref 70–99)

## 2018-10-08 LAB — MAGNESIUM: Magnesium: 1.7 mg/dL (ref 1.7–2.4)

## 2018-10-08 MED ORDER — METOPROLOL TARTRATE 5 MG/5ML IV SOLN
5.0000 mg | Freq: Four times a day (QID) | INTRAVENOUS | Status: DC | PRN
  Administered 2018-10-08 – 2018-10-16 (×2): 5 mg via INTRAVENOUS
  Filled 2018-10-08 (×3): qty 5

## 2018-10-08 MED ORDER — METOPROLOL TARTRATE 25 MG/10 ML ORAL SUSPENSION
12.5000 mg | Freq: Two times a day (BID) | ORAL | Status: DC
  Administered 2018-10-08 – 2018-10-14 (×14): 12.5 mg
  Filled 2018-10-08 (×15): qty 10
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
## 2018-10-08 NOTE — Care Management (Signed)
Clinical update and MAR faxed to Laretta Bolster, RN, pt's Worker's Comp Case Production designer, theatre/television/film, per her request.    Quintella Baton, RN, BSN  Trauma/Neuro ICU Case Manager [phone]

## 2018-10-08 NOTE — Progress Notes (Signed)
Patient ID: Christopher [name], male   DOB: [birth year], 53 y.o.   MRN: [id number] Follow up - Trauma Critical Care  Patient Details:    Christopher [name] is an 53 y.o. male.  Lines/tubes : PICC Double Lumen 08/10/18 PICC Left Brachial 50 cm 1 cm (Active)  Indication for Insertion or Continuance of Line Prolonged intravenous therapies 10/06/2018  8:00 PM  Exposed Catheter (cm) 1 cm 08/10/2018  3:39 PM  Site Assessment Clean;Dry;Intact 10/06/2018  8:00 AM  Lumen #1 Status Infusing 10/06/2018  8:00 AM  Lumen #2 Status In-line blood sampling system in place 10/06/2018  8:00 AM  Dressing Type Transparent;Occlusive 10/06/2018  8:00 AM  Dressing Status Clean;Dry;Intact;Antimicrobial disc in place 10/06/2018  8:00 AM  Line Care Connections checked and tightened 10/06/2018  8:00 AM  Line Adjustment (NICU/IV Team Only) No 10/03/2018  8:00 PM  Dressing Intervention New dressing;Antimicrobial disc changed;Securement device changed;Dressing changed 10/05/2018  4:00 PM  Dressing Change Due 10/12/18 10/06/2018  8:00 AM     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)  Surrounding Skin Intact 10/07/2018  4:00 AM  Tube Status Patent 10/07/2018  4:00 AM  Drainage Appearance None 10/07/2018  4:00 AM  Dressing Status Clean;Dry;Intact 10/07/2018  4:00 AM  Dressing Intervention Dressing changed 10/06/2018  8:00 AM  Dressing Type Dry dressing 10/07/2018  4:00 AM  Dressing Change Due 09/16/18 09/27/2018  8:00 PM  G Port Intake (mL) 400 ml 10/06/2018  4:42 PM  J Port Intake (mL) 240 ml 10/06/2018 11:00 PM  Output (mL) 75 mL 09/06/2018  6:00 PM     Colostomy LLQ (Active)  Ostomy Pouch 2 piece 10/07/2018  4:00 AM  Stoma Assessment Pink 10/07/2018  4:00 AM  Peristomal Assessment Intact 10/07/2018  4:00 AM  Treatment Pouch change 10/06/2018  4:00 AM  Output (mL) 200 mL 10/06/2018  8:00 PM     Suprapubic Catheter Non-latex 14 Fr. (Active)  Site Assessment Clean;Intact 10/07/2018   4:00 AM  Dressing Status Clean;Dry 10/07/2018  4:00 AM  Dressing Type Dry dressing 10/07/2018  4:00 AM  Collection Container Standard drainage bag 10/07/2018  4:00 AM  Securement Method Sutured 10/07/2018  4:00 AM  Indication for Insertion or Continuance of Catheter Bladder outlet obstruction / other urologic reason 10/07/2018  4:00 AM  Output (mL) 200 mL 10/07/2018  5:00 AM    Microbiology/Sepsis markers: Results for orders placed or performed during the hospital encounter of 07/10/18  MRSA PCR Screening     Status: None   Collection Time: 07/11/18  1:47 AM  Result Value Ref Range Status   MRSA by PCR NEGATIVE NEGATIVE Final    Comment:        The GeneXpert MRSA Assay (FDA approved for NASAL specimens only), is one component of a comprehensive MRSA colonization surveillance program. It is not intended to diagnose MRSA infection nor to guide or monitor treatment for MRSA infections. Performed at [facility], [address] [place], [place] [address]   Surgical pcr screen     Status: None   Collection Time: 07/12/18  8:11 AM  Result Value Ref Range Status   MRSA, PCR NEGATIVE NEGATIVE Final   Staphylococcus aureus NEGATIVE NEGATIVE Final    Comment: (NOTE) The Xpert SA Assay (FDA approved for NASAL specimens in patients 29 years of age and older), is one component of a comprehensive surveillance program. It is not intended to diagnose infection nor to guide or monitor treatment. Performed at [facility], [address] [place], [place] [address]   Culture, blood (Routine X 2) w Reflex to ID Panel     Status: None   Collection Time: 07/17/18  8:40 AM  Result Value Ref Range Status   Specimen Description BLOOD RIGHT ARM  Final   Special Requests   Final    BOTTLES DRAWN AEROBIC AND ANAEROBIC Blood Culture adequate volume   Culture   Final    NO GROWTH 5 DAYS Performed at [facility], [address] [place], [place] [address]    Report Status 07/22/2018  FINAL  Final  Culture, blood (Routine X 2) w Reflex to ID Panel     Status: None   Collection Time: 07/17/18  8:52 AM  Result Value Ref Range Status   Specimen Description BLOOD RIGHT HAND  Final   Special Requests   Final    BOTTLES DRAWN AEROBIC ONLY Blood Culture adequate volume   Culture   Final    NO GROWTH 5 DAYS Performed at [facility], [address] [place], [place] [address]    Report Status 07/22/2018 FINAL  Final  Culture, blood (Routine X 2) w Reflex to ID Panel     Status: None   Collection Time: 07/30/18  8:50 AM  Result Value Ref Range Status   Specimen Description BLOOD LEFT HAND  Final   Special Requests   Final    BOTTLES DRAWN AEROBIC ONLY Blood Culture results may not be optimal due to an inadequate volume of blood received in culture bottles Performed at [facility], [address] [place], [place] [address]    Culture NO GROWTH 5 DAYS  Final   Report Status 08/04/2018 FINAL  Final  Culture, blood (Routine X 2) w Reflex to ID Panel     Status: None   Collection Time: 07/30/18  9:56 AM  Result Value Ref Range Status   Specimen Description BLOOD LEFT ARM  Final   Special Requests   Final    BOTTLES DRAWN AEROBIC ONLY Blood Culture results may not be optimal due to an inadequate volume of blood received in culture bottles Performed at [facility], [address] [place], [place] [address]    Culture NO GROWTH 5 DAYS  Final   Report Status 08/04/2018 FINAL  Final  Culture, respiratory (non-expectorated)     Status: None   Collection Time: 07/30/18 11:38 AM  Result Value Ref Range Status   Specimen Description TRACHEAL ASPIRATE  Final   Special Requests Normal  Final   Gram Stain   Final    RARE WBC PRESENT, PREDOMINANTLY PMN RARE GRAM POSITIVE COCCI Performed at [facility], [address] [place], [place] [address]    Culture   Final    MODERATE ACINETOBACTER CALCOACETICUS/BAUMANNII COMPLEX   Report Status 08/01/2018  FINAL  Final   Organism ID, Bacteria ACINETOBACTER CALCOACETICUS/BAUMANNII COMPLEX  Final      Susceptibility   Acinetobacter calcoaceticus/baumannii complex - MIC*    CEFTAZIDIME 8 SENSITIVE Sensitive     CEFTRIAXONE 32 INTERMEDIATE Intermediate     CIPROFLOXACIN <=0.25 SENSITIVE Sensitive     GENTAMICIN <=1 SENSITIVE Sensitive     IMIPENEM <=0.25 SENSITIVE Sensitive     PIP/TAZO <=4 SENSITIVE Sensitive     TRIMETH/SULFA <=20 SENSITIVE Sensitive     CEFEPIME 4 SENSITIVE Sensitive     AMPICILLIN/SULBACTAM <=2 SENSITIVE Sensitive     * MODERATE ACINETOBACTER CALCOACETICUS/BAUMANNII COMPLEX  Culture, respiratory (non-expectorated)     Status: None   Collection Time: 08/13/18  9:22 AM  Result Value Ref Range Status   Specimen Description TRACHEAL ASPIRATE  Final   Special Requests Normal  Final   Gram Stain   Final    FEW WBC PRESENT, PREDOMINANTLY PMN MODERATE GRAM POSITIVE COCCI MODERATE GRAM NEGATIVE RODS    Culture   Final    Consistent with normal respiratory flora. Performed at [facility], [address] [place], [place] [address]    Report Status 08/15/2018 FINAL  Final  Culture, blood (Routine X 2) w Reflex to ID Panel     Status: None   Collection Time: 08/13/18  4:23 PM  Result Value Ref Range Status   Specimen Description BLOOD FOOT  Final   Special Requests   Final    BOTTLES DRAWN AEROBIC ONLY Blood Culture results may not be optimal due to an inadequate volume of blood received in culture bottles   Culture   Final    NO GROWTH 5 DAYS Performed at [facility], [address], [place], [place] [address]    Report Status 08/18/2018 FINAL  Final  Culture, blood (Routine X 2) w Reflex to ID Panel     Status: None   Collection Time: 08/13/18  4:41 PM  Result Value Ref Range Status   Specimen Description BLOOD FOOT  Final   Special Requests   Final    BOTTLES DRAWN AEROBIC ONLY Blood Culture results may not be optimal due to an inadequate volume of  blood received in culture bottles   Culture   Final    NO GROWTH 5 DAYS Performed at [facility], [address], [place], [place] [address]    Report Status 08/18/2018 FINAL  Final  Surgical pcr screen     Status: None   Collection Time: 08/16/18  4:03 AM  Result Value Ref Range Status   MRSA, PCR NEGATIVE NEGATIVE Final   Staphylococcus aureus NEGATIVE NEGATIVE Final    Comment: (NOTE) The Xpert SA Assay (FDA approved for NASAL specimens in patients 50 years of age and older), is one component of a comprehensive surveillance program. It is not intended to diagnose infection nor to guide or monitor treatment. Performed at [facility], [address], [place], [place] [address]   Culture, blood (routine x 2)     Status: Abnormal   Collection Time: 08/18/18 11:50 AM  Result Value Ref Range Status   Specimen Description BLOOD RIGHT HAND  Final   Special Requests   Final    BOTTLES DRAWN AEROBIC ONLY Blood Culture adequate volume   Culture  Setup Time   Final    GRAM NEGATIVE RODS AEROBIC BOTTLE ONLY CRITICAL RESULT CALLED TO, READ BACK BY AND VERIFIED WITH: V. BRYK,PHARMD [id number] 08/19/2018 T. TYSOR    Culture (A)  Final    SERRATIA MARCESCENS PSEUDOMONAS PUTIDA CRITICAL RESULT CALLED TO, READ BACK BY AND VERIFIED WITH: PHARMD M LORI [id number] AT 757 AM BY CM Performed at [facility], [address] [place], [place] [address]    Report Status 08/22/2018 FINAL  Final   Organism ID, Bacteria SERRATIA MARCESCENS  Final   Organism ID, Bacteria PSEUDOMONAS PUTIDA  Final      Susceptibility   Pseudomonas putida - MIC*    CEFTAZIDIME 16 INTERMEDIATE Intermediate     CIPROFLOXACIN 0.5 SENSITIVE Sensitive     GENTAMICIN <=1 SENSITIVE Sensitive  IMIPENEM 2 SENSITIVE Sensitive     PIP/TAZO >=128 RESISTANT Resistant     CEFEPIME 8 SENSITIVE Sensitive     * PSEUDOMONAS PUTIDA   Serratia marcescens - MIC*    CEFAZOLIN >=64 RESISTANT Resistant     CEFEPIME <=1  SENSITIVE Sensitive     CEFTAZIDIME <=1 SENSITIVE Sensitive     CEFTRIAXONE <=1 SENSITIVE Sensitive     CIPROFLOXACIN <=0.25 SENSITIVE Sensitive     GENTAMICIN <=1 SENSITIVE Sensitive     TRIMETH/SULFA <=20 SENSITIVE Sensitive     * SERRATIA MARCESCENS  Blood Culture ID Panel (Reflexed)     Status: Abnormal   Collection Time: 08/18/18 11:50 AM  Result Value Ref Range Status   Enterococcus species NOT DETECTED NOT DETECTED Final   Listeria monocytogenes NOT DETECTED NOT DETECTED Final   Staphylococcus species NOT DETECTED NOT DETECTED Final   Staphylococcus aureus (BCID) NOT DETECTED NOT DETECTED Final   Streptococcus species NOT DETECTED NOT DETECTED Final   Streptococcus agalactiae NOT DETECTED NOT DETECTED Final   Streptococcus pneumoniae NOT DETECTED NOT DETECTED Final   Streptococcus pyogenes NOT DETECTED NOT DETECTED Final   Acinetobacter baumannii NOT DETECTED NOT DETECTED Final   Enterobacteriaceae species DETECTED (A) NOT DETECTED Final    Comment: Enterobacteriaceae represent a large family of gram-negative bacteria, not a single organism. CRITICAL RESULT CALLED TO, READ BACK BY AND VERIFIED WITH: V. Joan Mayans [phone] 08/19/2018 T. TYSOR    Enterobacter cloacae complex NOT DETECTED NOT DETECTED Final   Escherichia coli NOT DETECTED NOT DETECTED Final   Klebsiella oxytoca NOT DETECTED NOT DETECTED Final   Klebsiella pneumoniae NOT DETECTED NOT DETECTED Final   Proteus species NOT DETECTED NOT DETECTED Final   Serratia marcescens DETECTED (A) NOT DETECTED Final    Comment: CRITICAL RESULT CALLED TO, READ BACK BY AND VERIFIED WITH: VJoan Mayans [id number] 08/19/2018 T. TYSOR    Carbapenem resistance NOT DETECTED NOT DETECTED Final   Haemophilus influenzae NOT DETECTED NOT DETECTED Final   Neisseria meningitidis NOT DETECTED NOT DETECTED Final   Pseudomonas aeruginosa NOT DETECTED NOT DETECTED Final   Candida albicans NOT DETECTED NOT DETECTED Final   Candida glabrata NOT  DETECTED NOT DETECTED Final   Candida krusei NOT DETECTED NOT DETECTED Final   Candida parapsilosis NOT DETECTED NOT DETECTED Final   Candida tropicalis NOT DETECTED NOT DETECTED Final    Comment: Performed at [facility], [address], [place], [place] [address]  Culture, blood (routine x 2)     Status: None   Collection Time: 08/18/18 11:58 AM  Result Value Ref Range Status   Specimen Description BLOOD RIGHT ANTECUBITAL  Final   Special Requests   Final    BOTTLES DRAWN AEROBIC ONLY Blood Culture adequate volume   Culture   Final    NO GROWTH 5 DAYS Performed at [facility], [address], [place], [place] [address]    Report Status 08/23/2018 FINAL  Final  MRSA PCR Screening     Status: None   Collection Time: 09/21/18  1:30 PM  Result Value Ref Range Status   MRSA by PCR NEGATIVE NEGATIVE Final    Comment:        The GeneXpert MRSA Assay (FDA approved for NASAL specimens only), is one component of a comprehensive MRSA colonization surveillance program. It is not intended to diagnose MRSA infection nor to guide or monitor treatment for MRSA infections. Performed at [facility], [address], [place], [place] [address]  Culture, blood (routine x 2)     Status: Abnormal   Collection Time: 09/24/18  3:55 PM  Result Value Ref Range Status   Specimen Description BLOOD RIGHT ANTECUBITAL  Final   Special Requests   Final    BOTTLES DRAWN AEROBIC ONLY Blood Culture adequate volume   Culture  Setup Time   Final    GRAM POSITIVE COCCI IN CLUSTERS AEROBIC BOTTLE ONLY CRITICAL RESULT CALLED TO, READ BACK BY AND VERIFIED WITH: J [place] PHARMD 09/25/18 1751 JDW    Culture (A)  Final    STAPHYLOCOCCUS SPECIES (COAGULASE NEGATIVE) THE SIGNIFICANCE OF ISOLATING THIS ORGANISM FROM A SINGLE SET OF BLOOD CULTURES WHEN MULTIPLE SETS ARE DRAWN IS UNCERTAIN. PLEASE NOTIFY THE MICROBIOLOGY DEPARTMENT WITHIN ONE WEEK IF SPECIATION AND SENSITIVITIES ARE  REQUIRED. Performed at [facility], [address] [place], [place] [address]    Report Status 09/26/2018 FINAL  Final  Blood Culture ID Panel (Reflexed)     Status: Abnormal   Collection Time: 09/24/18  3:55 PM  Result Value Ref Range Status   Enterococcus species NOT DETECTED NOT DETECTED Final   Listeria monocytogenes NOT DETECTED NOT DETECTED Final   Staphylococcus species DETECTED (A) NOT DETECTED Final    Comment: Methicillin (oxacillin) resistant coagulase negative staphylococcus. Possible blood culture contaminant (unless isolated from more than one blood culture draw or clinical case suggests pathogenicity). No antibiotic treatment is indicated for blood  culture contaminants. CRITICAL RESULT CALLED TO, READ BACK BY AND VERIFIED WITH: J [place] PHARMD 09/25/18 1751 JDW    Staphylococcus aureus (BCID) NOT DETECTED NOT DETECTED Final   Methicillin resistance DETECTED (A) NOT DETECTED Final    Comment: CRITICAL RESULT CALLED TO, READ BACK BY AND VERIFIED WITH: J [place] PHARMD 09/25/18 1751 JDW    Streptococcus species NOT DETECTED NOT DETECTED Final   Streptococcus agalactiae NOT DETECTED NOT DETECTED Final   Streptococcus pneumoniae NOT DETECTED NOT DETECTED Final   Streptococcus pyogenes NOT DETECTED NOT DETECTED Final   Acinetobacter baumannii NOT DETECTED NOT DETECTED Final   Enterobacteriaceae species NOT DETECTED NOT DETECTED Final   Enterobacter cloacae complex NOT DETECTED NOT DETECTED Final   Escherichia coli NOT DETECTED NOT DETECTED Final   Klebsiella oxytoca NOT DETECTED NOT DETECTED Final   Klebsiella pneumoniae NOT DETECTED NOT DETECTED Final   Proteus species NOT DETECTED NOT DETECTED Final   Serratia marcescens NOT DETECTED NOT DETECTED Final   Haemophilus influenzae NOT DETECTED NOT DETECTED Final   Neisseria meningitidis NOT DETECTED NOT DETECTED Final   Pseudomonas aeruginosa NOT DETECTED NOT DETECTED Final   Candida albicans NOT DETECTED NOT DETECTED  Final   Candida glabrata NOT DETECTED NOT DETECTED Final   Candida krusei NOT DETECTED NOT DETECTED Final   Candida parapsilosis NOT DETECTED NOT DETECTED Final   Candida tropicalis NOT DETECTED NOT DETECTED Final    Comment: Performed at [facility], [address] [place], [place] [address]  Culture, blood (routine x 2)     Status: None   Collection Time: 09/24/18  3:59 PM  Result Value Ref Range Status   Specimen Description BLOOD HAND  Final   Special Requests   Final    BOTTLES DRAWN AEROBIC ONLY Blood Culture adequate volume   Culture   Final    NO GROWTH 5 DAYS Performed at [facility], [address] [place], [place] [address]    Report Status 09/29/2018 FINAL  Final  Culture, Urine  Status: Abnormal   Collection Time: 09/24/18  4:22 PM  Result Value Ref Range Status   Specimen Description URINE, RANDOM  Final   Special Requests   Final    NONE Performed at [facility], [address] [place], [place] [address]    Culture >=100,000 COLONIES/mL ENTEROCOCCUS FAECALIS (A)  Final   Report Status 09/26/2018 FINAL  Final   Organism ID, Bacteria ENTEROCOCCUS FAECALIS (A)  Final      Susceptibility   Enterococcus faecalis - MIC*    AMPICILLIN <=2 SENSITIVE Sensitive     LEVOFLOXACIN >=8 RESISTANT Resistant     NITROFURANTOIN <=16 SENSITIVE Sensitive     VANCOMYCIN 1 SENSITIVE Sensitive     * >=100,000 COLONIES/mL ENTEROCOCCUS FAECALIS  Culture, Urine     Status: None   Collection Time: 09/28/18  6:53 PM  Result Value Ref Range Status   Specimen Description URINE, CATHETERIZED  Final   Special Requests Immunocompromised  Final   Culture   Final    NO GROWTH Performed at [facility], [address], [place], [place] [address]    Report Status 09/29/2018 FINAL  Final  Culture, Urine     Status: None   Collection Time: 10/06/18  8:59 AM  Result Value Ref Range Status   Specimen Description URINE, RANDOM  Final   Special Requests NONE   Final   Culture   Final    NO GROWTH Performed at [facility], [address] [place], [place] [address]    Report Status 10/07/2018 FINAL  Final  Culture, blood (routine x 2)     Status: None (Preliminary result)   Collection Time: 10/06/18  9:55 AM  Result Value Ref Range Status   Specimen Description BLOOD RIGHT FOREARM  Final   Special Requests   Final    AEROBIC BOTTLE ONLY Blood Culture results may not be optimal due to an excessive volume of blood received in culture bottles   Culture   Final    NO GROWTH < 24 HOURS Performed at [facility], [address] [place], [place] [address]    Report Status PENDING  Incomplete  Culture, blood (routine x 2)     Status: None (Preliminary result)   Collection Time: 10/06/18 10:00 AM  Result Value Ref Range Status   Specimen Description BLOOD RIGHT ANTECUBITAL  Final   Special Requests   Final    AEROBIC BOTTLE ONLY Blood Culture results may not be optimal due to an excessive volume of blood received in culture bottles   Culture   Final    NO GROWTH < 24 HOURS Performed at [facility], [address], [place], [place] [address]    Report Status PENDING  Incomplete    Anti-infectives:  Anti-infectives (From admission, onward)   Start     Dose/Rate Route Frequency Ordered Stop   10/03/18 0000  ceFAZolin (ANCEF) IVPB 2g/100 mL premix     2 g 200 mL/hr over 30 Minutes Intravenous To Surgery 10/02/18 1822 10/03/18 1348   09/27/18 0830  ampicillin (OMNIPEN) 2 g in sodium chloride 0.9 % 100 mL IVPB  Status:  Discontinued     2 g 300 mL/hr over 20 Minutes Intravenous Every 6 hours 09/27/18 0822 10/05/18 0900   09/25/18 2000  vancomycin (VANCOCIN) 1,250 mg in sodium chloride 0.9 % 250 mL IVPB  Status:  Discontinued     1,250 mg 166.7 mL/hr over 90 Minutes Intravenous  Every 12 hours 09/25/18 1854 09/27/18 0822   09/25/18 0800  ceFEPIme (MAXIPIME) 2 g in sodium chloride 0.9 % 100 mL IVPB  Status:  Discontinued     2  g 200 mL/hr over 30 Minutes Intravenous Every 8 hours 09/25/18 0750 09/27/18 0822   09/19/18 1257  polymyxin B 500,000 Units, bacitracin 50,000 Units in sodium chloride 0.9 % 500 mL irrigation  Status:  Discontinued       As needed 09/19/18 1257 09/19/18 1608   09/19/18 1100  ceFAZolin (ANCEF) IVPB 2g/100 mL premix     2 g 200 mL/hr over 30 Minutes Intravenous To Surgery 09/18/18 2055 09/19/18 1334   08/29/18 1512  polymyxin B 500,000 Units, bacitracin 50,000 Units in sodium chloride 0.9 % 500 mL irrigation  Status:  Discontinued       As needed 08/29/18 1512 08/29/18 1721   08/23/18 1200  meropenem (MERREM) 1 g in sodium chloride 0.9 % 100 mL IVPB     1 g 200 mL/hr over 30 Minutes Intravenous Every 8 hours 08/23/18 1122 09/05/18 1311   08/22/18 1400  ceFEPIme (MAXIPIME) 2 g in sodium chloride 0.9 % 100 mL IVPB  Status:  Discontinued     2 g 200 mL/hr over 30 Minutes Intravenous Every 8 hours 08/22/18 0836 08/22/18 1027   08/22/18 1200  meropenem (MERREM) 2 g in sodium chloride 0.9 % 100 mL IVPB  Status:  Discontinued     2 g 200 mL/hr over 30 Minutes Intravenous Every 8 hours 08/22/18 1027 08/23/18 1122   08/21/18 1400  ceFEPIme (MAXIPIME) 1 g in sodium chloride 0.9 % 100 mL IVPB  Status:  Discontinued     1 g 200 mL/hr over 30 Minutes Intravenous Every 8 hours 08/21/18 1042 08/22/18 0836   08/19/18 1030  cefTRIAXone (ROCEPHIN) 2 g in sodium chloride 0.9 % 100 mL IVPB  Status:  Discontinued     2 g 200 mL/hr over 30 Minutes Intravenous Every 24 hours 08/19/18 1017 08/21/18 1042   08/13/18 1400  piperacillin-tazobactam (ZOSYN) IVPB 3.375 g  Status:  Discontinued     3.375 g 12.5 mL/hr over 240 Minutes Intravenous Every 8 hours 08/13/18 0928 08/16/18 0854   08/13/18 1354  polymyxin B 500,000 Units, bacitracin 50,000 Units in sodium chloride 0.9 % 500 mL irrigation  Status:  Discontinued       As needed 08/13/18 1354 08/13/18 1538   08/13/18 0930  piperacillin-tazobactam (ZOSYN) IVPB 3.375  g     3.375 g 100 mL/hr over 30 Minutes Intravenous  Once 08/13/18 0928 08/13/18 1200   08/06/18 0801  polymyxin B 500,000 Units, bacitracin 50,000 Units in sodium chloride 0.9 % 500 mL irrigation  Status:  Discontinued       As needed 08/06/18 0803 08/06/18 0951   08/06/18 0600  ceFAZolin (ANCEF) IVPB 2g/100 mL premix  Status:  Discontinued     2 g 200 mL/hr over 30 Minutes Intravenous On call to O.R. 08/05/18 2241 08/05/18 2241   08/06/18 0600  ceFAZolin (ANCEF) IVPB 2g/100 mL premix  Status:  Discontinued     2 g 200 mL/hr over 30 Minutes Intravenous To Short Stay 08/05/18 2241 08/06/18 1024   08/01/18 2200  Ampicillin-Sulbactam (UNASYN) 3 g in sodium chloride 0.9 % 100 mL IVPB     3 g 200 mL/hr over 30 Minutes Intravenous Every 6 hours 08/01/18 2146 08/08/18 2235   08/01/18 1630  piperacillin-tazobactam (ZOSYN) IVPB 3.375 g  Status:  Discontinued  3.375 g 12.5 mL/hr over 240 Minutes Intravenous Every 8 hours 08/01/18 1624 08/01/18 2139   07/25/18 1508  polymyxin B 500,000 Units, bacitracin 50,000 Units in sodium chloride 0.9 % 500 mL irrigation  Status:  Discontinued       As needed 07/25/18 1509 07/25/18 1750   07/25/18 1445  piperacillin-tazobactam (ZOSYN) IVPB 3.375 g     3.375 g 100 mL/hr over 30 Minutes Intravenous STAT 07/25/18 1443 07/25/18 1620   07/25/18 0600  ceFAZolin (ANCEF) 3 g in dextrose 5 % 50 mL IVPB  Status:  Discontinued     3 g 100 mL/hr over 30 Minutes Intravenous To ShortStay Surgical 07/24/18 1735 07/25/18 1753   07/18/18 1444  polymyxin B 500,000 Units, bacitracin 50,000 Units in sodium chloride 0.9 % 500 mL irrigation  Status:  Discontinued       As needed 07/18/18 1445 07/18/18 1738   07/17/18 0900  piperacillin-tazobactam (ZOSYN) IVPB 3.375 g  Status:  Discontinued     3.375 g 12.5 mL/hr over 240 Minutes Intravenous Every 8 hours 07/17/18 0810 07/27/18 0806   07/12/18 1430  metronidazole (FLAGYL) IVPB 500 mg  Status:  Discontinued     500 mg 100 mL/hr  over 60 Minutes Intravenous To Surgery 07/12/18 1427 07/12/18 1608   07/12/18 1430  cefTRIAXone (ROCEPHIN) 2 g in sodium chloride 0.9 % 100 mL IVPB  Status:  Discontinued     2 g 200 mL/hr over 30 Minutes Intravenous To Surgery 07/12/18 1427 07/12/18 1608   07/12/18 1245  tobramycin (NEBCIN) powder  Status:  Discontinued       As needed 07/12/18 1245 07/12/18 1542   07/12/18 1243  vancomycin (VANCOCIN) powder  Status:  Discontinued       As needed 07/12/18 1244 07/12/18 1542   07/11/18 1330  cefTRIAXone (ROCEPHIN) 2 g in sodium chloride 0.9 % 100 mL IVPB  Status:  Discontinued     2 g 200 mL/hr over 30 Minutes Intravenous Every 24 hours 07/11/18 1259 07/17/18 0810   07/11/18 1300  metroNIDAZOLE (FLAGYL) IVPB 500 mg  Status:  Discontinued     500 mg 100 mL/hr over 60 Minutes Intravenous Every 8 hours 07/11/18 1259 07/17/18 0810   07/11/18 0400  ceFAZolin (ANCEF) IVPB 2g/100 mL premix  Status:  Discontinued     2 g 200 mL/hr over 30 Minutes Intravenous Every 8 hours 07/10/18 2109 07/11/18 1259   07/10/18 1815  ceFAZolin (ANCEF) IVPB 2g/100 mL premix  Status:  Discontinued     2 g 200 mL/hr over 30 Minutes Intravenous  Once 07/10/18 1814 07/10/18 2339      Best Practice/Protocols:  VTE Prophylaxis: Lovenox (prophylaxtic dose)  Consults: Treatment Team:  Md, Trauma, MD Haddix, Gillie MannersKevin P, MD Dillingham, Alena Billslaire S, DO   Subjective:    Overnight

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
## 2018-10-09 ENCOUNTER — Inpatient Hospital Stay (HOSPITAL_COMMUNITY): Payer: No Typology Code available for payment source

## 2018-10-09 DIAGNOSIS — R509 Fever, unspecified: Secondary | ICD-10-CM

## 2018-10-09 LAB — GLUCOSE, CAPILLARY
Glucose-Capillary: 139 mg/dL — ABNORMAL HIGH (ref 70–99)
Glucose-Capillary: 104 mg/dL — ABNORMAL HIGH (ref 70–99)
Glucose-Capillary: 145 mg/dL — ABNORMAL HIGH (ref 70–99)
Glucose-Capillary: 97 mg/dL (ref 70–99)
Glucose-Capillary: 132 mg/dL — ABNORMAL HIGH (ref 70–99)
Glucose-Capillary: 154 mg/dL — ABNORMAL HIGH (ref 70–99)

## 2018-10-09 LAB — CBC
HCT: 25.5 % — ABNORMAL LOW (ref 39.0–52.0)
Hemoglobin: 7.5 g/dL — ABNORMAL LOW (ref 13.0–17.0)
MCH: 32.1 pg (ref 26.0–34.0)
MCHC: 29.4 g/dL — ABNORMAL LOW (ref 30.0–36.0)
MCV: 109 fL — ABNORMAL HIGH (ref 80.0–100.0)
Platelets: 376 10*3/uL (ref 150–400)
RBC: 2.34 MIL/uL — ABNORMAL LOW (ref 4.22–5.81)
RDW: 18.5 % — ABNORMAL HIGH (ref 11.5–15.5)
WBC: 5.6 10*3/uL (ref 4.0–10.5)
nRBC: 0 % (ref 0.0–0.2)

## 2018-10-09 LAB — BASIC METABOLIC PANEL
Anion gap: 6 (ref 5–15)
BUN: 16 mg/dL (ref 6–20)
CO2: 27 mmol/L (ref 22–32)
Calcium: 9.2 mg/dL (ref 8.9–10.3)
Chloride: 108 mmol/L (ref 98–111)
Creatinine, Ser: 0.3 mg/dL — ABNORMAL LOW (ref 0.61–1.24)
GFR calc Af Amer: >60 mL/min (ref >60)
GFR calc non Af Amer: >60 mL/min (ref >60)
Glucose, Bld: 156 mg/dL — ABNORMAL HIGH (ref 70–99)
Potassium: 3.8 mmol/L (ref 3.5–5.1)
Sodium: 141 mmol/L (ref 135–145)

## 2018-10-09 NOTE — Progress Notes (Signed)
Pt still awaiting leg [place] to R/O DVT. Called vasc lab. They said he will be done this afternoon.

## 2018-10-09 NOTE — Progress Notes (Signed)
Pt's family [name] updated over phone by RN.

## 2018-10-09 NOTE — Progress Notes (Signed)
Patient ID: Christopher [name], male   DOB: [birth year], 53 y.o.   MRN: [id number] Follow up - Trauma Critical Care  Patient Details:    Christopher [name] is an 53 y.o. male.  Lines/tubes : PICC Double Lumen 08/10/18 PICC Left Brachial 50 cm 1 cm (Active)  Indication for Insertion or Continuance of Line Prolonged intravenous therapies 10/08/2018  8:00 PM  Exposed Catheter (cm) 1 cm 08/10/2018  3:39 PM  Site Assessment Clean;Dry;Intact 10/08/2018  8:00 PM  Lumen #1 Status Infusing 10/08/2018  8:00 PM  Lumen #2 Status In-line blood sampling system in place 10/08/2018  8:00 PM  Dressing Type Transparent;Occlusive 10/08/2018  8:00 PM  Dressing Status Clean;Dry;Intact;Antimicrobial disc in place 10/08/2018  8:00 PM  Line Care Connections checked and tightened;Zeroed and calibrated 10/08/2018  8:00 PM  Line Adjustment (NICU/IV Team Only) No 10/03/2018  8:00 PM  Dressing Intervention New dressing;Antimicrobial disc changed;Securement device changed;Dressing changed 10/05/2018  4:00 PM  Dressing Change Due 10/12/18 10/08/2018  8:00 PM     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)  Surrounding Skin Dry;Intact 10/08/2018  8:00 PM  Tube Status Patent 10/08/2018  8:00 PM  Drainage Appearance None 10/08/2018  8:00 PM  Dressing Status Clean;Dry;Intact 10/08/2018  8:00 PM  Dressing Intervention Dressing changed 10/07/2018  8:00 AM  Dressing Type Split gauze 10/08/2018  8:00 PM  Dressing Change Due 09/16/18 09/27/2018  8:00 PM  G Port Intake (mL) 400 ml 10/06/2018  4:42 PM  J Port Intake (mL) 200 ml 10/07/2018  1:38 PM  Output (mL) 75 mL 09/06/2018  6:00 PM     Colostomy LLQ (Active)  Ostomy Pouch 2 piece 10/08/2018  8:00 PM  Stoma Assessment Red 10/08/2018  8:00 PM  Peristomal Assessment Intact 10/08/2018  8:00 PM  Treatment Pouch change 10/06/2018  4:00 AM  Output (mL) 150 mL 10/08/2018  3:00 AM     Suprapubic Catheter Non-latex 14 Fr. (Active)  Site Assessment  Clean;Intact;Dry 10/08/2018  8:00 PM  Dressing Status Clean;Dry;Intact 10/08/2018  8:00 PM  Dressing Type Dry dressing 10/08/2018  8:00 PM  Collection Container Standard drainage bag 10/08/2018  8:00 PM  Securement Method Securement Device 10/08/2018  8:00 PM  Indication for Insertion or Continuance of Catheter Bladder outlet obstruction / other urologic reason 10/08/2018  8:00 PM  Output (mL) 1650 mL 10/09/2018  6:00 AM    Microbiology/Sepsis markers: Results for orders placed or performed during the hospital encounter of 07/10/18  MRSA PCR Screening     Status: None   Collection Time: 07/11/18  1:47 AM  Result Value Ref Range Status   MRSA by PCR NEGATIVE NEGATIVE Final    Comment:        The GeneXpert MRSA Assay (FDA approved for NASAL specimens only), is one component of a comprehensive MRSA colonization surveillance program. It is not intended to diagnose MRSA infection nor to guide or monitor treatment for MRSA infections. Performed at [facility], [address] [place], [place] [address]   Surgical pcr screen     Status: None   Collection Time: 07/12/18  8:11 AM  Result Value Ref Range Status   MRSA, PCR NEGATIVE NEGATIVE Final   Staphylococcus aureus NEGATIVE NEGATIVE Final    Comment: (NOTE) The Xpert SA Assay (FDA approved for NASAL specimens in patients 72 years of age and older), is one component of a comprehensive surveillance program. It is not intended to diagnose infection nor to guide or monitor treatment.  Performed at [facility], [address] [place], [place] [address]   Culture, blood (Routine X 2) w Reflex to ID Panel     Status: None   Collection Time: 07/17/18  8:40 AM  Result Value Ref Range Status   Specimen Description BLOOD RIGHT ARM  Final   Special Requests   Final    BOTTLES DRAWN AEROBIC AND ANAEROBIC Blood Culture adequate volume   Culture   Final    NO GROWTH 5 DAYS Performed at [facility], [address]  [place], [place] [address]    Report Status 07/22/2018 FINAL  Final  Culture, blood (Routine X 2) w Reflex to ID Panel     Status: None   Collection Time: 07/17/18  8:52 AM  Result Value Ref Range Status   Specimen Description BLOOD RIGHT HAND  Final   Special Requests   Final    BOTTLES DRAWN AEROBIC ONLY Blood Culture adequate volume   Culture   Final    NO GROWTH 5 DAYS Performed at [facility], [address], [place], [place] [address]    Report Status 07/22/2018 FINAL  Final  Culture, blood (Routine X 2) w Reflex to ID Panel     Status: None   Collection Time: 07/30/18  8:50 AM  Result Value Ref Range Status   Specimen Description BLOOD LEFT HAND  Final   Special Requests   Final    BOTTLES DRAWN AEROBIC ONLY Blood Culture results may not be optimal due to an inadequate volume of blood received in culture bottles Performed at [facility], [address], [place], [place] [address]    Culture NO GROWTH 5 DAYS  Final   Report Status 08/04/2018 FINAL  Final  Culture, blood (Routine X 2) w Reflex to ID Panel     Status: None   Collection Time: 07/30/18  9:56 AM  Result Value Ref Range Status   Specimen Description BLOOD LEFT ARM  Final   Special Requests   Final    BOTTLES DRAWN AEROBIC ONLY Blood Culture results may not be optimal due to an inadequate volume of blood received in culture bottles Performed at [facility], [address] [place], [place] [address]    Culture NO GROWTH 5 DAYS  Final   Report Status 08/04/2018 FINAL  Final  Culture, respiratory (non-expectorated)     Status: None   Collection Time: 07/30/18 11:38 AM  Result Value Ref Range Status   Specimen Description TRACHEAL ASPIRATE  Final   Special Requests Normal  Final   Gram Stain   Final    RARE WBC PRESENT, PREDOMINANTLY PMN RARE GRAM POSITIVE COCCI Performed at [facility], [address], [place], [place] [address]    Culture   Final    MODERATE ACINETOBACTER  CALCOACETICUS/BAUMANNII COMPLEX   Report Status 08/01/2018 FINAL  Final   Organism ID, Bacteria ACINETOBACTER CALCOACETICUS/BAUMANNII COMPLEX  Final      Susceptibility   Acinetobacter calcoaceticus/baumannii complex - MIC*    CEFTAZIDIME 8 SENSITIVE Sensitive     CEFTRIAXONE 32 INTERMEDIATE Intermediate     CIPROFLOXACIN <=0.25 SENSITIVE Sensitive     GENTAMICIN <=1 SENSITIVE Sensitive     IMIPENEM <=0.25 SENSITIVE Sensitive     PIP/TAZO <=4 SENSITIVE Sensitive     TRIMETH/SULFA <=20 SENSITIVE Sensitive     CEFEPIME 4 SENSITIVE Sensitive     AMPICILLIN/SULBACTAM <=2 SENSITIVE Sensitive     *  MODERATE ACINETOBACTER CALCOACETICUS/BAUMANNII COMPLEX  Culture, respiratory (non-expectorated)     Status: None   Collection Time: 08/13/18  9:22 AM  Result Value Ref Range Status   Specimen Description TRACHEAL ASPIRATE  Final   Special Requests Normal  Final   Gram Stain   Final    FEW WBC PRESENT, PREDOMINANTLY PMN MODERATE GRAM POSITIVE COCCI MODERATE GRAM NEGATIVE RODS    Culture   Final    Consistent with normal respiratory flora. Performed at [facility], [address] [place], [place] [address]    Report Status 08/15/2018 FINAL  Final  Culture, blood (Routine X 2) w Reflex to ID Panel     Status: None   Collection Time: 08/13/18  4:23 PM  Result Value Ref Range Status   Specimen Description BLOOD FOOT  Final   Special Requests   Final    BOTTLES DRAWN AEROBIC ONLY Blood Culture results may not be optimal due to an inadequate volume of blood received in culture bottles   Culture   Final    NO GROWTH 5 DAYS Performed at [facility], [address] [place], [place] [address]    Report Status 08/18/2018 FINAL  Final  Culture, blood (Routine X 2) w Reflex to ID Panel     Status: None   Collection Time: 08/13/18  4:41 PM  Result Value Ref Range Status   Specimen Description BLOOD FOOT  Final   Special Requests   Final    BOTTLES DRAWN AEROBIC ONLY Blood Culture  results may not be optimal due to an inadequate volume of blood received in culture bottles   Culture   Final    NO GROWTH 5 DAYS Performed at [facility], [address] [place], [place] [address]    Report Status 08/18/2018 FINAL  Final  Surgical pcr screen     Status: None   Collection Time: 08/16/18  4:03 AM  Result Value Ref Range Status   MRSA, PCR NEGATIVE NEGATIVE Final   Staphylococcus aureus NEGATIVE NEGATIVE Final    Comment: (NOTE) The Xpert SA Assay (FDA approved for NASAL specimens in patients 52 years of age and older), is one component of a comprehensive surveillance program. It is not intended to diagnose infection nor to guide or monitor treatment. Performed at [facility], [address] [place], [place] [address]   Culture, blood (routine x 2)     Status: Abnormal   Collection Time: 08/18/18 11:50 AM  Result Value Ref Range Status   Specimen Description BLOOD RIGHT HAND  Final   Special Requests   Final    BOTTLES DRAWN AEROBIC ONLY Blood Culture adequate volume   Culture  Setup Time   Final    GRAM NEGATIVE RODS AEROBIC BOTTLE ONLY CRITICAL RESULT CALLED TO, READ BACK BY AND VERIFIED WITH: V. BRYK,PHARMD [id number] 08/19/2018 T. TYSOR    Culture (A)  Final    SERRATIA MARCESCENS PSEUDOMONAS PUTIDA CRITICAL RESULT CALLED TO, READ BACK BY AND VERIFIED WITH: PHARMD M LORI [phone] AT 757 AM BY CM Performed at [facility], [address] [place], [place] [address]    Report Status 08/22/2018 FINAL  Final   Organism ID, Bacteria SERRATIA MARCESCENS  Final   Organism ID, Bacteria PSEUDOMONAS PUTIDA  Final      Susceptibility   Pseudomonas putida - MIC*    CEFTAZIDIME 16 INTERMEDIATE Intermediate     CIPROFLOXACIN 0.5 SENSITIVE Sensitive  GENTAMICIN <=1 SENSITIVE Sensitive     IMIPENEM 2 SENSITIVE Sensitive     PIP/TAZO >=128 RESISTANT Resistant     CEFEPIME 8 SENSITIVE Sensitive     * PSEUDOMONAS PUTIDA   Serratia marcescens - MIC*     CEFAZOLIN >=64 RESISTANT Resistant     CEFEPIME <=1 SENSITIVE Sensitive     CEFTAZIDIME <=1 SENSITIVE Sensitive     CEFTRIAXONE <=1 SENSITIVE Sensitive     CIPROFLOXACIN <=0.25 SENSITIVE Sensitive     GENTAMICIN <=1 SENSITIVE Sensitive     TRIMETH/SULFA <=20 SENSITIVE Sensitive     * SERRATIA MARCESCENS  Blood Culture ID Panel (Reflexed)     Status: Abnormal   Collection Time: 08/18/18 11:50 AM  Result Value Ref Range Status   Enterococcus species NOT DETECTED NOT DETECTED Final   Listeria monocytogenes NOT DETECTED NOT DETECTED Final   Staphylococcus species NOT DETECTED NOT DETECTED Final   Staphylococcus aureus (BCID) NOT DETECTED NOT DETECTED Final   Streptococcus species NOT DETECTED NOT DETECTED Final   Streptococcus agalactiae NOT DETECTED NOT DETECTED Final   Streptococcus pneumoniae NOT DETECTED NOT DETECTED Final   Streptococcus pyogenes NOT DETECTED NOT DETECTED Final   Acinetobacter baumannii NOT DETECTED NOT DETECTED Final   Enterobacteriaceae species DETECTED (A) NOT DETECTED Final    Comment: Enterobacteriaceae represent a large family of gram-negative bacteria, not a single organism. CRITICAL RESULT CALLED TO, READ BACK BY AND VERIFIED WITH: V. Joan Mayans 1610 08/19/2018 T. TYSOR    Enterobacter cloacae complex NOT DETECTED NOT DETECTED Final   Escherichia coli NOT DETECTED NOT DETECTED Final   Klebsiella oxytoca NOT DETECTED NOT DETECTED Final   Klebsiella pneumoniae NOT DETECTED NOT DETECTED Final   Proteus species NOT DETECTED NOT DETECTED Final   Serratia marcescens DETECTED (A) NOT DETECTED Final    Comment: CRITICAL RESULT CALLED TO, READ BACK BY AND VERIFIED WITH: VJoan Mayans [id number] 08/19/2018 T. TYSOR    Carbapenem resistance NOT DETECTED NOT DETECTED Final   Haemophilus influenzae NOT DETECTED NOT DETECTED Final   Neisseria meningitidis NOT DETECTED NOT DETECTED Final   Pseudomonas aeruginosa NOT DETECTED NOT DETECTED Final   Candida albicans NOT  DETECTED NOT DETECTED Final   Candida glabrata NOT DETECTED NOT DETECTED Final   Candida krusei NOT DETECTED NOT DETECTED Final   Candida parapsilosis NOT DETECTED NOT DETECTED Final   Candida tropicalis NOT DETECTED NOT DETECTED Final    Comment: Performed at [facility], [address], [place], [place] [address]  Culture, blood (routine x 2)     Status: None   Collection Time: 08/18/18 11:58 AM  Result Value Ref Range Status   Specimen Description BLOOD RIGHT ANTECUBITAL  Final   Special Requests   Final    BOTTLES DRAWN AEROBIC ONLY Blood Culture adequate volume   Culture   Final    NO GROWTH 5 DAYS Performed at [facility], [address] [place], [place] [address]    Report Status 08/23/2018 FINAL  Final  MRSA PCR Screening     Status: None   Collection Time: 09/21/18  1:30 PM  Result Value Ref Range Status   MRSA by PCR NEGATIVE NEGATIVE Final    Comment:        The GeneXpert MRSA Assay (FDA approved for NASAL specimens only), is one component of a comprehensive MRSA colonization surveillance program. It is not intended to diagnose MRSA infection nor to guide or monitor treatment for MRSA infections. Performed at [facility], [address], [place], [place] [address]   Culture, blood (routine x 2)     Status: Abnormal   Collection Time: 09/24/18  3:55 PM  Result Value Ref Range Status   Specimen Description BLOOD RIGHT ANTECUBITAL  Final   Special Requests   Final    BOTTLES DRAWN AEROBIC ONLY Blood Culture adequate volume   Culture  Setup Time   Final    GRAM POSITIVE COCCI IN CLUSTERS AEROBIC BOTTLE ONLY CRITICAL RESULT CALLED TO, READ BACK BY AND VERIFIED WITH: J [place] PHARMD 09/25/18 1751 JDW    Culture (A)  Final    STAPHYLOCOCCUS SPECIES (COAGULASE NEGATIVE) THE SIGNIFICANCE OF ISOLATING THIS ORGANISM FROM A SINGLE SET OF BLOOD CULTURES WHEN MULTIPLE SETS ARE DRAWN IS UNCERTAIN. PLEASE NOTIFY THE MICROBIOLOGY DEPARTMENT WITHIN ONE WEEK  IF SPECIATION AND SENSITIVITIES ARE REQUIRED. Performed at [facility], [address] [place], [place] [address]    Report Status 09/26/2018 FINAL  Final  Blood Culture ID Panel (Reflexed)     Status: Abnormal   Collection Time: 09/24/18  3:55 PM  Result Value Ref Range Status   Enterococcus species NOT DETECTED NOT DETECTED Final   Listeria monocytogenes NOT DETECTED NOT DETECTED Final   Staphylococcus species DETECTED (A) NOT DETECTED Final    Comment: Methicillin (oxacillin) resistant coagulase negative staphylococcus. Possible blood culture contaminant (unless isolated from more than one blood culture draw or clinical case suggests pathogenicity). No antibiotic treatment is indicated for blood  culture contaminants. CRITICAL RESULT CALLED TO, READ BACK BY AND VERIFIED WITH: J [place] PHARMD 09/25/18 1751 JDW    Staphylococcus aureus (BCID) NOT DETECTED NOT DETECTED Final   Methicillin resistance DETECTED (A) NOT DETECTED Final    Comment: CRITICAL RESULT CALLED TO, READ BACK BY AND VERIFIED WITH: J [place] PHARMD 09/25/18 1751 JDW    Streptococcus species NOT DETECTED NOT DETECTED Final   Streptococcus agalactiae NOT DETECTED NOT DETECTED Final   Streptococcus pneumoniae NOT DETECTED NOT DETECTED Final   Streptococcus pyogenes NOT DETECTED NOT DETECTED Final   Acinetobacter baumannii NOT DETECTED NOT DETECTED Final   Enterobacteriaceae species NOT DETECTED NOT DETECTED Final   Enterobacter cloacae complex NOT DETECTED NOT DETECTED Final   Escherichia coli NOT DETECTED NOT DETECTED Final   Klebsiella oxytoca NOT DETECTED NOT DETECTED Final   Klebsiella pneumoniae NOT DETECTED NOT DETECTED Final   Proteus species NOT DETECTED NOT DETECTED Final   Serratia marcescens NOT DETECTED NOT DETECTED Final   Haemophilus influenzae NOT DETECTED NOT DETECTED Final   Neisseria meningitidis NOT DETECTED NOT DETECTED Final   Pseudomonas aeruginosa NOT DETECTED NOT DETECTED Final   Candida  albicans NOT DETECTED NOT DETECTED Final   Candida glabrata NOT DETECTED NOT DETECTED Final   Candida krusei NOT DETECTED NOT DETECTED Final   Candida parapsilosis NOT DETECTED NOT DETECTED Final   Candida tropicalis NOT DETECTED NOT DETECTED Final    Comment: Performed at [facility], [address] [place], [place] [address]  Culture, blood (routine x 2)     Status: None   Collection Time: 09/24/18  3:59 PM  Result Value Ref Range Status   Specimen Description BLOOD HAND  Final   Special Requests   Final    BOTTLES DRAWN AEROBIC ONLY Blood Culture adequate volume   Culture   Final    NO GROWTH 5 DAYS Performed at [facility], [address] [place], [place] [address]    Report Status 09/29/2018  FINAL  Final  Culture, Urine     Status: Abnormal   Collection Time: 09/24/18  4:22 PM  Result Value Ref Range Status   Specimen Description URINE, RANDOM  Final   Special Requests   Final    NONE Performed at [facility], [address] [place], [place] [address]    Culture >=100,000 COLONIES/mL ENTEROCOCCUS FAECALIS (A)  Final   Report Status 09/26/2018 FINAL  Final   Organism ID, Bacteria ENTEROCOCCUS FAECALIS (A)  Final      Susceptibility   Enterococcus faecalis - MIC*    AMPICILLIN <=2 SENSITIVE Sensitive     LEVOFLOXACIN >=8 RESISTANT Resistant     NITROFURANTOIN <=16 SENSITIVE Sensitive     VANCOMYCIN 1 SENSITIVE Sensitive     * >=100,000 COLONIES/mL ENTEROCOCCUS FAECALIS  Culture, Urine     Status: None   Collection Time: 09/28/18  6:53 PM  Result Value Ref Range Status   Specimen Description URINE, CATHETERIZED  Final   Special Requests Immunocompromised  Final   Culture   Final    NO GROWTH Performed at [facility], [address], [place], [place] [address]    Report Status 09/29/2018 FINAL  Final  Culture, Urine     Status: None   Collection Time: 10/06/18  8:59 AM  Result Value Ref Range Status   Specimen Description URINE, RANDOM   Final   Special Requests NONE  Final   Culture   Final    NO GROWTH Performed at [facility], [address] [place], [place] [address]    Report Status 10/07/2018 FINAL  Final  Culture, blood (routine x 2)     Status: None (Preliminary result)   Collection Time: 10/06/18  9:55 AM  Result Value Ref Range Status   Specimen Description BLOOD RIGHT FOREARM  Final   Special Requests   Final    AEROBIC BOTTLE ONLY Blood Culture results may not be optimal due to an excessive volume of blood received in culture bottles   Culture   Final    NO GROWTH 3 DAYS Performed at [facility], [address] [place], [place] [address]    Report Status PENDING  Incomplete  Culture, blood (routine x 2)     Status: None (Preliminary result)   Collection Time: 10/06/18 10:00 AM  Result Value Ref Range Status   Specimen Description BLOOD RIGHT ANTECUBITAL  Final   Special Requests   Final    AEROBIC BOTTLE ONLY Blood Culture results may not be optimal due to an excessive volume of blood received in culture bottles   Culture   Final    NO GROWTH 2 DAYS Performed at [facility], [address] [place], [place] [address]    Report Status PENDING  Incomplete    Anti-infectives:  Anti-infectives (From admission, onward)   Start     Dose/Rate Route Frequency Ordered Stop   10/03/18 0000  ceFAZolin (ANCEF) IVPB 2g/100 mL premix     2 g 200 mL/hr over 30 Minutes Intravenous To Surgery 10/02/18 1822 10/03/18 1348   09/27/18 0830  ampicillin (OMNIPEN) 2 g in sodium chloride 0.9 % 100 mL IVPB  Status:  Discontinued     2 g 300 mL/hr over 20 Minutes Intravenous Every 6 hours 09/27/18 0822 10/05/18 0900   09/25/18 2000  vancomycin (VANCOCIN) 1,250 mg in sodium chloride 0.9 % 250 mL IVPB  Status:  Discontinued  1,250 mg 166.7 mL/hr over 90 Minutes Intravenous Every 12 hours 09/25/18 1854 09/27/18 0822   09/25/18 0800  ceFEPIme (MAXIPIME) 2 g in sodium chloride 0.9 % 100 mL IVPB   Status:  Discontinued     2 g 200 mL/hr over 30 Minutes Intravenous Every 8 hours 09/25/18 0750 09/27/18 0822   09/19/18 1257  polymyxin B 500,000 Units, bacitracin 50,000 Units in sodium chloride 0.9 % 500 mL irrigation  Status:  Discontinued       As needed 09/19/18 1257 09/19/18 1608   09/19/18 1100  ceFAZolin (ANCEF) IVPB 2g/100 mL premix     2 g 200 mL/hr over 30 Minutes Intravenous To Surgery 09/18/18 2055 09/19/18 1334   08/29/18 1512  polymyxin B 500,000 Units, bacitracin 50,000 Units in sodium chloride 0.9 % 500 mL irrigation  Status:  Discontinued       As needed 08/29/18 1512 08/29/18 1721   08/23/18 1200  meropenem (MERREM) 1 g in sodium chloride 0.9 % 100 mL IVPB     1 g 200 mL/hr over 30 Minutes Intravenous Every 8 hours 08/23/18 1122 09/05/18 1311   08/22/18 1400  ceFEPIme (MAXIPIME) 2 g in sodium chloride 0.9 % 100 mL IVPB  Status:  Discontinued     2 g 200 mL/hr over 30 Minutes Intravenous Every 8 hours 08/22/18 0836 08/22/18 1027   08/22/18 1200  meropenem (MERREM) 2 g in sodium chloride 0.9 % 100 mL IVPB  Status:  Discontinued     2 g 200 mL/hr over 30 Minutes Intravenous Every 8 hours 08/22/18 1027 08/23/18 1122   08/21/18 1400  ceFEPIme (MAXIPIME) 1 g in sodium chloride 0.9 % 100 mL IVPB  Status:  Discontinued     1 g 200 mL/hr over 30 Minutes Intravenous Every 8 hours 08/21/18 1042 08/22/18 0836   08/19/18 1030  cefTRIAXone (ROCEPHIN) 2 g in sodium chloride 0.9 % 100 mL IVPB  Status:  Discontinued     2 g 200 mL/hr over 30 Minutes Intravenous Every 24 hours 08/19/18 1017 08/21/18 1042   08/13/18 1400  piperacillin-tazobactam (ZOSYN) IVPB 3.375 g  Status:  Discontinued     3.375 g 12.5 mL/hr over 240 Minutes Intravenous Every 8 hours 08/13/18 0928 08/16/18 0854   08/13/18 1354  polymyxin B 500,000 Units, bacitracin 50,000 Units in sodium chloride 0.9 % 500 mL irrigation  Status:  Discontinued       As needed 08/13/18 1354 08/13/18 1538   08/13/18 0930   piperacillin-tazobactam (ZOSYN) IVPB 3.375 g     3.375 g 100 mL/hr over 30 Minutes Intravenous  Once 08/13/18 0928 08/13/18 1200   08/06/18 0801  polymyxin B 500,000 Units, bacitracin 50,000 Units in sodium chloride 0.9 % 500 mL irrigation  Status:  Discontinued       As needed 08/06/18 0803 08/06/18 0951   08/06/18 0600  ceFAZolin (ANCEF) IVPB 2g/100 mL premix  Status:  Discontinued     2 g 200 mL/hr over 30 Minutes Intravenous On call to O.R. 08/05/18 2241 08/05/18 2241   08/06/18 0600  ceFAZolin (ANCEF) IVPB 2g/100 mL premix  Status:  Discontinued     2 g 200 mL/hr over 30 Minutes Intravenous To Short Stay 08/05/18 2241 08/06/18 1024   08/01/18 2200  Ampicillin-Sulbactam (UNASYN) 3 g in sodium chloride 0.9 % 100 mL IVPB     3 g 200 mL/hr over 30 Minutes Intravenous Every 6 hours 08/01/18 2146 08/08/18 2235   08/01/18 1630  piperacillin-tazobactam (ZOSYN) IVPB  3.375 g  Status:  Discontinued     3.375 g 12.5 mL/hr over 240 Minutes Intravenous Every 8 hours 08/01/18 1624 08/01/18 2139   07/25/18 1508  polymyxin B 500,000 Units, bacitracin 50,000 Units in sodium chloride 0.9 % 500 mL irrigation  Status:  Discontinued       As needed 07/25/18 1509 07/25/18 1750   07/25/18 1445  piperacillin-tazobactam (ZOSYN) IVPB 3.375 g     3.375 g 100 mL/hr over 30 Minutes Intravenous STAT 07/25/18 1443 07/25/18 1620   07/25/18 0600  ceFAZolin (ANCEF) 3 g in dextrose 5 % 50 mL IVPB  Status:  Discontinued     3 g 100 mL/hr over 30 Minutes Intravenous To ShortStay Surgical 07/24/18 1735 07/25/18 1753   07/18/18 1444  polymyxin B 500,000 Units, bacitracin 50,000 Units in sodium chloride 0.9 % 500 mL irrigation  Status:  Discontinued       As needed 07/18/18 1445 07/18/18 1738   07/17/18 0900  piperacillin-tazobactam (ZOSYN) IVPB 3.375 g  Status:  Discontinued     3.375 g 12.5 mL/hr over 240 Minutes Intravenous Every 8 hours 07/17/18 0810 07/27/18 0806   07/12/18 1430  metronidazole (FLAGYL) IVPB 500 mg   Status:  Discontinued     500 mg 100 mL/hr over 60 Minutes Intravenous To Surgery 07/12/18 1427 07/12/18 1608   07/12/18 1430  cefTRIAXone (ROCEPHIN) 2 g in sodium chloride 0.9 % 100 mL IVPB  Status:  Discontinued     2 g 200 mL/hr over 30 Minutes Intravenous To Surgery 07/12/18 1427 07/12/18 1608   07/12/18 1245  tobramycin (NEBCIN) powder  Status:  Discontinued       As needed 07/12/18 1245 07/12/18 1542   07/12/18 1243  vancomycin (VANCOCIN) powder  Status:  Discontinued       As needed 07/12/18 1244 07/12/18 1542   07/11/18 1330  cefTRIAXone (ROCEPHIN) 2 g in sodium chloride 0.9 % 100 mL IVPB  Status:  Discontinued     2 g 200 mL/hr over 30 Minutes Intravenous Every 24 hours 07/11/18 1259 07/17/18 0810   07/11/18 1300  metroNIDAZOLE (FLAGYL) IVPB 500 mg  Status:  Discontinued     500 mg 100 mL/hr over 60 Minutes Intravenous Every 8 hours 07/11/18 1259 07/17/18 0810   07/11/18 0400  ceFAZolin (ANCEF) IVPB 2g/100 mL premix  Status:  Discontinued     2 g 200 mL/hr over 30 Minutes Intravenous Every 8 hours 07/10/18 2109 07/11/18 1259   07/10/18 1815  ceFAZolin (ANCEF) IVPB 2g/100 mL premix  Status:  Discontinued     2 g 200 mL/hr over 30 Minutes Intravenous  Once 07/10/18 1814 07/10/18 2339      Best Practice/Protocols:  VTE Prophylaxis: Lovenox (prophylaxtic dose)  Consults: Treatment Team:  Md, Trauma, MD Haddix, Gillie MannersKevin P, MD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

## 2018-10-09 NOTE — Progress Notes (Signed)
Bilateral lower extremity venous duplex completed. Preliminary results - Limited exam due to bandages. Results found in Chart review CV Proc. [employer], RVS 10/09/2018 , 12:52 pm
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
## 2018-10-10 LAB — GLUCOSE, CAPILLARY
Glucose-Capillary: 135 mg/dL — ABNORMAL HIGH (ref 70–99)
Glucose-Capillary: 129 mg/dL — ABNORMAL HIGH (ref 70–99)
Glucose-Capillary: 135 mg/dL — ABNORMAL HIGH (ref 70–99)
Glucose-Capillary: 147 mg/dL — ABNORMAL HIGH (ref 70–99)
Glucose-Capillary: 115 mg/dL — ABNORMAL HIGH (ref 70–99)
Glucose-Capillary: 144 mg/dL — ABNORMAL HIGH (ref 70–99)

## 2018-10-10 MED ORDER — ADULT MULTIVITAMIN LIQUID CH
15.0000 mL | Freq: Every day | ORAL | Status: DC
  Administered 2018-10-10 – 2018-10-16 (×7): 15 mL via ORAL
  Filled 2018-10-10 (×7): qty 15
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
## 2018-10-10 NOTE — Progress Notes (Signed)
Assisted tele visit to patient with girlfriend, [name].  Vena Austria, RN

## 2018-10-10 NOTE — Progress Notes (Signed)
Follow up - Trauma and Critical Care  Patient Details:    Christopher [name] is an 53 y.o. male.  Lines/tubes : PICC Double Lumen 08/10/18 PICC Left Brachial 50 cm 1 cm (Active)  Indication for Insertion or Continuance of Line Prolonged intravenous therapies 10/09/2018  8:00 PM  Exposed Catheter (cm) 1 cm 08/10/2018  3:39 PM  Site Assessment Clean;Dry;Intact 10/09/2018  8:00 PM  Lumen #1 Status Infusing 10/09/2018  8:00 PM  Lumen #2 Status In-line blood sampling system in place 10/09/2018  8:00 PM  Dressing Type Transparent;Occlusive 10/09/2018  8:00 PM  Dressing Status Clean;Dry;Intact;Antimicrobial disc in place 10/09/2018  8:00 PM  Line Care Connections checked and tightened;Zeroed and calibrated 10/09/2018  8:00 PM  Line Adjustment (NICU/IV Team Only) No 10/03/2018  8:00 PM  Dressing Intervention New dressing;Antimicrobial disc changed;Securement device changed;Dressing changed 10/05/2018  4:00 PM  Dressing Change Due 10/12/18 10/09/2018  8:00 PM     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)  Surrounding Skin Dry;Intact 10/09/2018  8:00 PM  Tube Status Patent 10/09/2018  8:00 PM  Drainage Appearance None 10/09/2018  8:00 PM  Dressing Status Clean;Dry;Intact 10/09/2018  8:00 PM  Dressing Intervention Dressing changed 10/07/2018  8:00 AM  Dressing Type Split gauze 10/09/2018  8:00 PM  Dressing Change Due 09/16/18 09/27/2018  8:00 PM  G Port Intake (mL) 400 ml 10/06/2018  4:42 PM  J Port Intake (mL) 200 ml 10/07/2018  1:38 PM  Output (mL) 75 mL 09/06/2018  6:00 PM     Colostomy LLQ (Active)  Ostomy Pouch 2 piece 10/09/2018  8:00 PM  Stoma Assessment Red 10/09/2018  8:00 PM  Peristomal Assessment Intact 10/09/2018  8:00 PM  Treatment Pouch change 10/06/2018  4:00 AM  Output (mL) 150 mL 10/08/2018  3:00 AM     Suprapubic Catheter Non-latex 14 Fr. (Active)  Site Assessment Clean;Intact;Dry 10/09/2018  8:00 PM  Dressing Status Clean;Dry;Intact 10/09/2018   8:00 PM  Dressing Type Dry dressing 10/09/2018  8:00 PM  Collection Container Standard drainage bag 10/09/2018  8:00 PM  Securement Method Securement Device 10/08/2018  8:00 PM  Indication for Insertion or Continuance of Catheter Bladder outlet obstruction / other urologic reason 10/08/2018  8:00 PM  Output (mL) 900 mL 10/09/2018  5:00 PM    Microbiology/Sepsis markers: Results for orders placed or performed during the hospital encounter of 07/10/18  MRSA PCR Screening     Status: None   Collection Time: 07/11/18  1:47 AM  Result Value Ref Range Status   MRSA by PCR NEGATIVE NEGATIVE Final    Comment:        The GeneXpert MRSA Assay (FDA approved for NASAL specimens only), is one component of a comprehensive MRSA colonization surveillance program. It is not intended to diagnose MRSA infection nor to guide or monitor treatment for MRSA infections. Performed at [facility], [address], [place], [place] [address]   Surgical pcr screen     Status: None   Collection Time: 07/12/18  8:11 AM  Result Value Ref Range Status   MRSA, PCR NEGATIVE NEGATIVE Final   Staphylococcus aureus NEGATIVE NEGATIVE Final    Comment: (NOTE) The Xpert SA Assay (FDA approved for NASAL specimens in patients 40 years of age and older), is one component of a comprehensive surveillance program. It is not intended to diagnose infection nor to guide or monitor treatment. Performed at [facility], [address] [place], [place] [address]  Culture, blood (Routine X 2) w Reflex to ID Panel     Status: None   Collection Time: 07/17/18  8:40 AM  Result Value Ref Range Status   Specimen Description BLOOD RIGHT ARM  Final   Special Requests   Final    BOTTLES DRAWN AEROBIC AND ANAEROBIC Blood Culture adequate volume   Culture   Final    NO GROWTH 5 DAYS Performed at [facility], [address], [place], [place] [address]    Report Status 07/22/2018 FINAL  Final  Culture, blood  (Routine X 2) w Reflex to ID Panel     Status: None   Collection Time: 07/17/18  8:52 AM  Result Value Ref Range Status   Specimen Description BLOOD RIGHT HAND  Final   Special Requests   Final    BOTTLES DRAWN AEROBIC ONLY Blood Culture adequate volume   Culture   Final    NO GROWTH 5 DAYS Performed at [facility], [address] [place], [place] [address]    Report Status 07/22/2018 FINAL  Final  Culture, blood (Routine X 2) w Reflex to ID Panel     Status: None   Collection Time: 07/30/18  8:50 AM  Result Value Ref Range Status   Specimen Description BLOOD LEFT HAND  Final   Special Requests   Final    BOTTLES DRAWN AEROBIC ONLY Blood Culture results may not be optimal due to an inadequate volume of blood received in culture bottles Performed at [facility], [address] [place], [place] [address]    Culture NO GROWTH 5 DAYS  Final   Report Status 08/04/2018 FINAL  Final  Culture, blood (Routine X 2) w Reflex to ID Panel     Status: None   Collection Time: 07/30/18  9:56 AM  Result Value Ref Range Status   Specimen Description BLOOD LEFT ARM  Final   Special Requests   Final    BOTTLES DRAWN AEROBIC ONLY Blood Culture results may not be optimal due to an inadequate volume of blood received in culture bottles Performed at [facility], [address] [place], [place] [address]    Culture NO GROWTH 5 DAYS  Final   Report Status 08/04/2018 FINAL  Final  Culture, respiratory (non-expectorated)     Status: None   Collection Time: 07/30/18 11:38 AM  Result Value Ref Range Status   Specimen Description TRACHEAL ASPIRATE  Final   Special Requests Normal  Final   Gram Stain   Final    RARE WBC PRESENT, PREDOMINANTLY PMN RARE GRAM POSITIVE COCCI Performed at [facility], [address], [place], [place] [address]    Culture   Final    MODERATE ACINETOBACTER CALCOACETICUS/BAUMANNII COMPLEX   Report Status 08/01/2018 FINAL  Final   Organism ID,  Bacteria ACINETOBACTER CALCOACETICUS/BAUMANNII COMPLEX  Final      Susceptibility   Acinetobacter calcoaceticus/baumannii complex - MIC*    CEFTAZIDIME 8 SENSITIVE Sensitive     CEFTRIAXONE 32 INTERMEDIATE Intermediate     CIPROFLOXACIN <=0.25 SENSITIVE Sensitive     GENTAMICIN <=1 SENSITIVE Sensitive     IMIPENEM <=0.25 SENSITIVE Sensitive     PIP/TAZO <=4 SENSITIVE Sensitive     TRIMETH/SULFA <=20 SENSITIVE Sensitive     CEFEPIME 4 SENSITIVE Sensitive     AMPICILLIN/SULBACTAM <=2 SENSITIVE Sensitive     * MODERATE ACINETOBACTER CALCOACETICUS/BAUMANNII COMPLEX  Culture, respiratory (non-expectorated)     Status: None  Collection Time: 08/13/18  9:22 AM  Result Value Ref Range Status   Specimen Description TRACHEAL ASPIRATE  Final   Special Requests Normal  Final   Gram Stain   Final    FEW WBC PRESENT, PREDOMINANTLY PMN MODERATE GRAM POSITIVE COCCI MODERATE GRAM NEGATIVE RODS    Culture   Final    Consistent with normal respiratory flora. Performed at [facility], [address] [place], [place] [address]    Report Status 08/15/2018 FINAL  Final  Culture, blood (Routine X 2) w Reflex to ID Panel     Status: None   Collection Time: 08/13/18  4:23 PM  Result Value Ref Range Status   Specimen Description BLOOD FOOT  Final   Special Requests   Final    BOTTLES DRAWN AEROBIC ONLY Blood Culture results may not be optimal due to an inadequate volume of blood received in culture bottles   Culture   Final    NO GROWTH 5 DAYS Performed at [facility], [address] [place], [place] [address]    Report Status 08/18/2018 FINAL  Final  Culture, blood (Routine X 2) w Reflex to ID Panel     Status: None   Collection Time: 08/13/18  4:41 PM  Result Value Ref Range Status   Specimen Description BLOOD FOOT  Final   Special Requests   Final    BOTTLES DRAWN AEROBIC ONLY Blood Culture results may not be optimal due to an inadequate volume of blood received in culture  bottles   Culture   Final    NO GROWTH 5 DAYS Performed at [facility], [address] [place], [place] [address]    Report Status 08/18/2018 FINAL  Final  Surgical pcr screen     Status: None   Collection Time: 08/16/18  4:03 AM  Result Value Ref Range Status   MRSA, PCR NEGATIVE NEGATIVE Final   Staphylococcus aureus NEGATIVE NEGATIVE Final    Comment: (NOTE) The Xpert SA Assay (FDA approved for NASAL specimens in patients 52 years of age and older), is one component of a comprehensive surveillance program. It is not intended to diagnose infection nor to guide or monitor treatment. Performed at [facility], [address] [place], [place] [address]   Culture, blood (routine x 2)     Status: Abnormal   Collection Time: 08/18/18 11:50 AM  Result Value Ref Range Status   Specimen Description BLOOD RIGHT HAND  Final   Special Requests   Final    BOTTLES DRAWN AEROBIC ONLY Blood Culture adequate volume   Culture  Setup Time   Final    GRAM NEGATIVE RODS AEROBIC BOTTLE ONLY CRITICAL RESULT CALLED TO, READ BACK BY AND VERIFIED WITH: V. BRYK,PHARMD [id number] 08/19/2018 T. TYSOR    Culture (A)  Final    SERRATIA MARCESCENS PSEUDOMONAS PUTIDA CRITICAL RESULT CALLED TO, READ BACK BY AND VERIFIED WITH: PHARMD M LORI [phone] AT 757 AM BY CM Performed at [facility], [address] [place], [place] [address]    Report Status 08/22/2018 FINAL  Final   Organism ID, Bacteria SERRATIA MARCESCENS  Final   Organism ID, Bacteria PSEUDOMONAS PUTIDA  Final      Susceptibility   Pseudomonas putida - MIC*    CEFTAZIDIME 16 INTERMEDIATE Intermediate     CIPROFLOXACIN 0.5 SENSITIVE Sensitive     GENTAMICIN <=1 SENSITIVE Sensitive     IMIPENEM 2 SENSITIVE Sensitive  PIP/TAZO >=128 RESISTANT Resistant     CEFEPIME 8 SENSITIVE Sensitive     * PSEUDOMONAS PUTIDA   Serratia marcescens - MIC*    CEFAZOLIN >=64 RESISTANT Resistant     CEFEPIME <=1 SENSITIVE Sensitive      CEFTAZIDIME <=1 SENSITIVE Sensitive     CEFTRIAXONE <=1 SENSITIVE Sensitive     CIPROFLOXACIN <=0.25 SENSITIVE Sensitive     GENTAMICIN <=1 SENSITIVE Sensitive     TRIMETH/SULFA <=20 SENSITIVE Sensitive     * SERRATIA MARCESCENS  Blood Culture ID Panel (Reflexed)     Status: Abnormal   Collection Time: 08/18/18 11:50 AM  Result Value Ref Range Status   Enterococcus species NOT DETECTED NOT DETECTED Final   Listeria monocytogenes NOT DETECTED NOT DETECTED Final   Staphylococcus species NOT DETECTED NOT DETECTED Final   Staphylococcus aureus (BCID) NOT DETECTED NOT DETECTED Final   Streptococcus species NOT DETECTED NOT DETECTED Final   Streptococcus agalactiae NOT DETECTED NOT DETECTED Final   Streptococcus pneumoniae NOT DETECTED NOT DETECTED Final   Streptococcus pyogenes NOT DETECTED NOT DETECTED Final   Acinetobacter baumannii NOT DETECTED NOT DETECTED Final   Enterobacteriaceae species DETECTED (A) NOT DETECTED Final    Comment: Enterobacteriaceae represent a large family of gram-negative bacteria, not a single organism. CRITICAL RESULT CALLED TO, READ BACK BY AND VERIFIED WITH: V. Joan Mayans [id number] 08/19/2018 T. TYSOR    Enterobacter cloacae complex NOT DETECTED NOT DETECTED Final   Escherichia coli NOT DETECTED NOT DETECTED Final   Klebsiella oxytoca NOT DETECTED NOT DETECTED Final   Klebsiella pneumoniae NOT DETECTED NOT DETECTED Final   Proteus species NOT DETECTED NOT DETECTED Final   Serratia marcescens DETECTED (A) NOT DETECTED Final    Comment: CRITICAL RESULT CALLED TO, READ BACK BY AND VERIFIED WITH: VJoan Mayans [id number] 08/19/2018 T. TYSOR    Carbapenem resistance NOT DETECTED NOT DETECTED Final   Haemophilus influenzae NOT DETECTED NOT DETECTED Final   Neisseria meningitidis NOT DETECTED NOT DETECTED Final   Pseudomonas aeruginosa NOT DETECTED NOT DETECTED Final   Candida albicans NOT DETECTED NOT DETECTED Final   Candida glabrata NOT DETECTED NOT DETECTED Final    Candida krusei NOT DETECTED NOT DETECTED Final   Candida parapsilosis NOT DETECTED NOT DETECTED Final   Candida tropicalis NOT DETECTED NOT DETECTED Final    Comment: Performed at [name], [address], [place], [place] [address]  Culture, blood (routine x 2)     Status: None   Collection Time: 08/18/18 11:58 AM  Result Value Ref Range Status   Specimen Description BLOOD RIGHT ANTECUBITAL  Final   Special Requests   Final    BOTTLES DRAWN AEROBIC ONLY Blood Culture adequate volume   Culture   Final    NO GROWTH 5 DAYS Performed at [facility], [address] [place], [place] [address]    Report Status 08/23/2018 FINAL  Final  MRSA PCR Screening     Status: None   Collection Time: 09/21/18  1:30 PM  Result Value Ref Range Status   MRSA by PCR NEGATIVE NEGATIVE Final    Comment:        The GeneXpert MRSA Assay (FDA approved for NASAL specimens only), is one component of a comprehensive MRSA colonization surveillance program. It is not intended to diagnose MRSA infection nor to guide or monitor treatment for MRSA infections. Performed at [facility], [address] [place], [place] [address]   Culture, blood (routine x 2)  Status: Abnormal   Collection Time: 09/24/18  3:55 PM  Result Value Ref Range Status   Specimen Description BLOOD RIGHT ANTECUBITAL  Final   Special Requests   Final    BOTTLES DRAWN AEROBIC ONLY Blood Culture adequate volume   Culture  Setup Time   Final    GRAM POSITIVE COCCI IN CLUSTERS AEROBIC BOTTLE ONLY CRITICAL RESULT CALLED TO, READ BACK BY AND VERIFIED WITH: J [place] PHARMD 09/25/18 1751 JDW    Culture (A)  Final    STAPHYLOCOCCUS SPECIES (COAGULASE NEGATIVE) THE SIGNIFICANCE OF ISOLATING THIS ORGANISM FROM A SINGLE SET OF BLOOD CULTURES WHEN MULTIPLE SETS ARE DRAWN IS UNCERTAIN. PLEASE NOTIFY THE MICROBIOLOGY DEPARTMENT WITHIN ONE WEEK IF SPECIATION AND SENSITIVITIES ARE REQUIRED. Performed at [facility], [address], [place], [place] [address]    Report Status 09/26/2018 FINAL  Final  Blood Culture ID Panel (Reflexed)     Status: Abnormal   Collection Time: 09/24/18  3:55 PM  Result Value Ref Range Status   Enterococcus species NOT DETECTED NOT DETECTED Final   Listeria monocytogenes NOT DETECTED NOT DETECTED Final   Staphylococcus species DETECTED (A) NOT DETECTED Final    Comment: Methicillin (oxacillin) resistant coagulase negative staphylococcus. Possible blood culture contaminant (unless isolated from more than one blood culture draw or clinical case suggests pathogenicity). No antibiotic treatment is indicated for blood  culture contaminants. CRITICAL RESULT CALLED TO, READ BACK BY AND VERIFIED WITH: J [place] PHARMD 09/25/18 1751 JDW    Staphylococcus aureus (BCID) NOT DETECTED NOT DETECTED Final   Methicillin resistance DETECTED (A) NOT DETECTED Final    Comment: CRITICAL RESULT CALLED TO, READ BACK BY AND VERIFIED WITH: J [place] PHARMD 09/25/18 1751 JDW    Streptococcus species NOT DETECTED NOT DETECTED Final   Streptococcus agalactiae NOT DETECTED NOT DETECTED Final   Streptococcus pneumoniae NOT DETECTED NOT DETECTED Final   Streptococcus pyogenes NOT DETECTED NOT DETECTED Final   Acinetobacter baumannii NOT DETECTED NOT DETECTED Final   Enterobacteriaceae species NOT DETECTED NOT DETECTED Final   Enterobacter cloacae complex NOT DETECTED NOT DETECTED Final   Escherichia coli NOT DETECTED NOT DETECTED Final   Klebsiella oxytoca NOT DETECTED NOT DETECTED Final   Klebsiella pneumoniae NOT DETECTED NOT DETECTED Final   Proteus species NOT DETECTED NOT DETECTED Final   Serratia marcescens NOT DETECTED NOT DETECTED Final   Haemophilus influenzae NOT DETECTED NOT DETECTED Final   Neisseria meningitidis NOT DETECTED NOT DETECTED Final   Pseudomonas aeruginosa NOT DETECTED NOT DETECTED Final   Candida albicans NOT DETECTED NOT DETECTED Final   Candida glabrata NOT DETECTED NOT  DETECTED Final   Candida krusei NOT DETECTED NOT DETECTED Final   Candida parapsilosis NOT DETECTED NOT DETECTED Final   Candida tropicalis NOT DETECTED NOT DETECTED Final    Comment: Performed at [facility], [address], [place], [place] [address]  Culture, blood (routine x 2)     Status: None   Collection Time: 09/24/18  3:59 PM  Result Value Ref Range Status   Specimen Description BLOOD HAND  Final   Special Requests   Final    BOTTLES DRAWN AEROBIC ONLY Blood Culture adequate volume   Culture   Final    NO GROWTH 5 DAYS Performed at [facility], [address], [place], [place] [address]    Report Status 09/29/2018 FINAL  Final  Culture, Urine     Status: Abnormal   Collection Time: 09/24/18  4:22  PM  Result Value Ref Range Status   Specimen Description URINE, RANDOM  Final   Special Requests   Final    NONE Performed at [facility], [address] [place], [place] [address]    Culture >=100,000 COLONIES/mL ENTEROCOCCUS FAECALIS (A)  Final   Report Status 09/26/2018 FINAL  Final   Organism ID, Bacteria ENTEROCOCCUS FAECALIS (A)  Final      Susceptibility   Enterococcus faecalis - MIC*    AMPICILLIN <=2 SENSITIVE Sensitive     LEVOFLOXACIN >=8 RESISTANT Resistant     NITROFURANTOIN <=16 SENSITIVE Sensitive     VANCOMYCIN 1 SENSITIVE Sensitive     * >=100,000 COLONIES/mL ENTEROCOCCUS FAECALIS  Culture, Urine     Status: None   Collection Time: 09/28/18  6:53 PM  Result Value Ref Range Status   Specimen Description URINE, CATHETERIZED  Final   Special Requests Immunocompromised  Final   Culture   Final    NO GROWTH Performed at [facility], [address] [place], [place] [address]    Report Status 09/29/2018 FINAL  Final  Culture, Urine     Status: None   Collection Time: 10/06/18  8:59 AM  Result Value Ref Range Status   Specimen Description URINE, RANDOM  Final   Special Requests NONE  Final   Culture   Final    NO  GROWTH Performed at [facility], [address] [place], [place] [address]    Report Status 10/07/2018 FINAL  Final  Culture, blood (routine x 2)     Status: None (Preliminary result)   Collection Time: 10/06/18  9:55 AM  Result Value Ref Range Status   Specimen Description BLOOD RIGHT FOREARM  Final   Special Requests   Final    AEROBIC BOTTLE ONLY Blood Culture results may not be optimal due to an excessive volume of blood received in culture bottles   Culture   Final    NO GROWTH 4 DAYS Performed at [facility], [address] [place], [place] [address]    Report Status PENDING  Incomplete  Culture, blood (routine x 2)     Status: None (Preliminary result)   Collection Time: 10/06/18 10:00 AM  Result Value Ref Range Status   Specimen Description BLOOD RIGHT ANTECUBITAL  Final   Special Requests   Final    AEROBIC BOTTLE ONLY Blood Culture results may not be optimal due to an excessive volume of blood received in culture bottles   Culture   Final    NO GROWTH 3 DAYS Performed at [facility], [address] [place], [place] [address]    Report Status PENDING  Incomplete    Anti-infectives:  Anti-infectives (From admission, onward)   Start     Dose/Rate Route Frequency Ordered Stop   10/03/18 0000  ceFAZolin (ANCEF) IVPB 2g/100 mL premix     2 g 200 mL/hr over 30 Minutes Intravenous To Surgery 10/02/18 1822 10/03/18 1348   09/27/18 0830  ampicillin (OMNIPEN) 2 g in sodium chloride 0.9 % 100 mL IVPB  Status:  Discontinued     2 g 300 mL/hr over 20 Minutes Intravenous Every 6 hours 09/27/18 0822 10/05/18 0900   09/25/18 2000  vancomycin (VANCOCIN) 1,250 mg in sodium chloride 0.9 % 250 mL IVPB  Status:  Discontinued     1,250 mg 166.7 mL/hr over 90 Minutes Intravenous Every 12 hours 09/25/18 1854 09/27/18 0822   09/25/18 0800  ceFEPIme (MAXIPIME) 2 g in sodium chloride 0.9 % 100 mL IVPB  Status:  Discontinued     2 g 200 mL/hr over 30 Minutes Intravenous  Every 8 hours 09/25/18 0750 09/27/18 0822   09/19/18 1257  polymyxin B 500,000 Units, bacitracin 50,000 Units in sodium chloride 0.9 % 500 mL irrigation  Status:  Discontinued       As needed 09/19/18 1257 09/19/18 1608   09/19/18 1100  ceFAZolin (ANCEF) IVPB 2g/100 mL premix     2 g 200 mL/hr over 30 Minutes Intravenous To Surgery 09/18/18 2055 09/19/18 1334   08/29/18 1512  polymyxin B 500,000 Units, bacitracin 50,000 Units in sodium chloride 0.9 % 500 mL irrigation  Status:  Discontinued       As needed 08/29/18 1512 08/29/18 1721   08/23/18 1200  meropenem (MERREM) 1 g in sodium chloride 0.9 % 100 mL IVPB     1 g 200 mL/hr over 30 Minutes Intravenous Every 8 hours 08/23/18 1122 09/05/18 1311   08/22/18 1400  ceFEPIme (MAXIPIME) 2 g in sodium chloride 0.9 % 100 mL IVPB  Status:  Discontinued     2 g 200 mL/hr over 30 Minutes Intravenous Every 8 hours 08/22/18 0836 08/22/18 1027   08/22/18 1200  meropenem (MERREM) 2 g in sodium chloride 0.9 % 100 mL IVPB  Status:  Discontinued     2 g 200 mL/hr over 30 Minutes Intravenous Every 8 hours 08/22/18 1027 08/23/18 1122   08/21/18 1400  ceFEPIme (MAXIPIME) 1 g in sodium chloride 0.9 % 100 mL IVPB  Status:  Discontinued     1 g 200 mL/hr over 30 Minutes Intravenous Every 8 hours 08/21/18 1042 08/22/18 0836   08/19/18 1030  cefTRIAXone (ROCEPHIN) 2 g in sodium chloride 0.9 % 100 mL IVPB  Status:  Discontinued     2 g 200 mL/hr over 30 Minutes Intravenous Every 24 hours 08/19/18 1017 08/21/18 1042   08/13/18 1400  piperacillin-tazobactam (ZOSYN) IVPB 3.375 g  Status:  Discontinued     3.375 g 12.5 mL/hr over 240 Minutes Intravenous Every 8 hours 08/13/18 0928 08/16/18 0854   08/13/18 1354  polymyxin B 500,000 Units, bacitracin 50,000 Units in sodium chloride 0.9 % 500 mL irrigation  Status:  Discontinued       As needed 08/13/18 1354 08/13/18 1538   08/13/18 0930  piperacillin-tazobactam (ZOSYN) IVPB 3.375 g     3.375 g 100 mL/hr over 30  Minutes Intravenous  Once 08/13/18 0928 08/13/18 1200   08/06/18 0801  polymyxin B 500,000 Units, bacitracin 50,000 Units in sodium chloride 0.9 % 500 mL irrigation  Status:  Discontinued       As needed 08/06/18 0803 08/06/18 0951   08/06/18 0600  ceFAZolin (ANCEF) IVPB 2g/100 mL premix  Status:  Discontinued     2 g 200 mL/hr over 30 Minutes Intravenous On call to O.R. 08/05/18 2241 08/05/18 2241   08/06/18 0600  ceFAZolin (ANCEF) IVPB 2g/100 mL premix  Status:  Discontinued     2 g 200 mL/hr over 30 Minutes Intravenous To Short Stay 08/05/18 2241 08/06/18 1024   08/01/18 2200  Ampicillin-Sulbactam (UNASYN) 3 g in sodium chloride 0.9 % 100 mL IVPB     3 g 200 mL/hr over 30 Minutes Intravenous Every 6 hours 08/01/18 2146 08/08/18 2235   08/01/18 1630  piperacillin-tazobactam (ZOSYN) IVPB 3.375 g  Status:  Discontinued     3.375 g 12.5 mL/hr over 240 Minutes Intravenous Every 8  hours 08/01/18 1624 08/01/18 2139   07/25/18 1508  polymyxin B 500,000 Units, bacitracin 50,000 Units in sodium chloride 0.9 % 500 mL irrigation  Status:  Discontinued       As needed 07/25/18 1509 07/25/18 1750   07/25/18 1445  piperacillin-tazobactam (ZOSYN) IVPB 3.375 g     3.375 g 100 mL/hr over 30 Minutes Intravenous STAT 07/25/18 1443 07/25/18 1620   07/25/18 0600  ceFAZolin (ANCEF) 3 g in dextrose 5 % 50 mL IVPB  Status:  Discontinued     3 g 100 mL/hr over 30 Minutes Intravenous To ShortStay Surgical 07/24/18 1735 07/25/18 1753   07/18/18 1444  polymyxin B 500,000 Units, bacitracin 50,000 Units in sodium chloride 0.9 % 500 mL irrigation  Status:  Discontinued       As needed 07/18/18 1445 07/18/18 1738   07/17/18 0900  piperacillin-tazobactam (ZOSYN) IVPB 3.375 g  Status:  Discontinued     3.375 g 12.5 mL/hr over 240 Minutes Intravenous Every 8 hours 07/17/18 0810 07/27/18 0806   07/12/18 1430  metronidazole (FLAGYL) IVPB 500 mg  Status:  Discontinued     500 mg 100 mL/hr over 60 Minutes Intravenous To  Surgery 07/12/18 1427 07/12/18 1608   07/12/18 1430  cefTRIAXone (ROCEPHIN) 2 g in sodium chloride 0.9 % 100 mL IVPB  Status:  Discontinued     2 g 200 mL/hr over 30 Minutes Intravenous To Surgery 07/12/18 1427 07/12/18 1608   07/12/18 1245  tobramycin (NEBCIN) powder  Status:  Discontinued       As needed 07/12/18 1245 07/12/18 1542   07/12/18 1243  vancomycin (VANCOCIN) powder  Status:  Discontinued       As needed 07/12/18 1244 07/12/18 1542   07/11/18 1330  cefTRIAXone (ROCEPHIN) 2 g in sodium chloride 0.9 % 100 mL IVPB  Status:  Discontinued     2 g 200 mL/hr over 30 Minutes Intravenous Every 24 hours 07/11/18 1259 07/17/18 0810   07/11/18 1300  metroNIDAZOLE (FLAGYL) IVPB 500 mg  Status:  Discontinued     500 mg 100 mL/hr over 60 Minutes Intravenous Every 8 hours 07/11/18 1259 07/17/18 0810   07/11/18 0400  ceFAZolin (ANCEF) IVPB 2g/100 mL premix  Status:  Discontinued     2 g 200 mL/hr over 30 Minutes Intravenous Every 8 hours 07/10/18 2109 07/11/18 1259   07/10/18 1815  ceFAZolin (ANCEF) IVPB 2g/100 mL premix  Status:  Discontinued     2 g 200 mL/hr over 30 Minutes Intravenous  Once 07/10/18 1814 07/10/18 2339      Best Practice/Protocols:  VTE Prophylaxis: Lovenox (prophylaxtic dose)   Consults: Treatment Team:  Md, Trauma, MD Haddix, Gillie Manners, MD Dillingham, Alena Bills, DO    Events:  Chief Complaint/Subjective:    Overnight Issues: No issues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

## 2018-10-10 NOTE — Care Management (Signed)
I have spoken with [facility] Coordinator, Corene Cornea and notified her that MD feels pt is reaching readiness for LTAC admission.  She will reevaluate patient for admission, though states they have no beds currently, and are not anticipating any discharges this week.    Will follow with updates as available.    Quintella Baton, RN, BSN  Trauma/Neuro ICU Case Manager [phone]
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
## 2018-10-11 LAB — CULTURE, BLOOD (ROUTINE X 2)
Culture: NO GROWTH
Culture: NO GROWTH

## 2018-10-11 LAB — CBC WITH DIFFERENTIAL/PLATELET
Abs Immature Granulocytes: 0.03 10*3/uL (ref 0.00–0.07)
Basophils Absolute: 0 10*3/uL (ref 0.0–0.1)
Basophils Relative: 0 %
Eosinophils Absolute: 0.6 10*3/uL — ABNORMAL HIGH (ref 0.0–0.5)
Eosinophils Relative: 9 %
HCT: 24.4 % — ABNORMAL LOW (ref 39.0–52.0)
Hemoglobin: 7 g/dL — ABNORMAL LOW (ref 13.0–17.0)
Immature Granulocytes: 1 %
Lymphocytes Relative: 19 %
Lymphs Abs: 1.1 10*3/uL (ref 0.7–4.0)
MCH: 32 pg (ref 26.0–34.0)
MCHC: 28.7 g/dL — ABNORMAL LOW (ref 30.0–36.0)
MCV: 111.4 fL — ABNORMAL HIGH (ref 80.0–100.0)
Monocytes Absolute: 0.6 10*3/uL (ref 0.1–1.0)
Monocytes Relative: 10 %
Neutro Abs: 3.6 10*3/uL (ref 1.7–7.7)
Neutrophils Relative %: 61 %
Platelets: 389 10*3/uL (ref 150–400)
RBC: 2.19 MIL/uL — ABNORMAL LOW (ref 4.22–5.81)
RDW: 18.3 % — ABNORMAL HIGH (ref 11.5–15.5)
WBC: 5.9 10*3/uL (ref 4.0–10.5)
nRBC: 0 % (ref 0.0–0.2)

## 2018-10-11 LAB — BASIC METABOLIC PANEL
Anion gap: 6 (ref 5–15)
BUN: 27 mg/dL — ABNORMAL HIGH (ref 6–20)
CO2: 28 mmol/L (ref 22–32)
Calcium: 9.6 mg/dL (ref 8.9–10.3)
Chloride: 107 mmol/L (ref 98–111)
Creatinine, Ser: <0.3 mg/dL — ABNORMAL LOW (ref 0.61–1.24)
Glucose, Bld: 130 mg/dL — ABNORMAL HIGH (ref 70–99)
Potassium: 4.1 mmol/L (ref 3.5–5.1)
Sodium: 141 mmol/L (ref 135–145)

## 2018-10-11 LAB — GLUCOSE, CAPILLARY
Glucose-Capillary: 115 mg/dL — ABNORMAL HIGH (ref 70–99)
Glucose-Capillary: 140 mg/dL — ABNORMAL HIGH (ref 70–99)
Glucose-Capillary: 155 mg/dL — ABNORMAL HIGH (ref 70–99)
Glucose-Capillary: 144 mg/dL — ABNORMAL HIGH (ref 70–99)
Glucose-Capillary: 102 mg/dL — ABNORMAL HIGH (ref 70–99)
Glucose-Capillary: 156 mg/dL — ABNORMAL HIGH (ref 70–99)

## 2018-10-11 MED ORDER — FENTANYL 75 MCG/HR TD PT72
1.0000 | MEDICATED_PATCH | TRANSDERMAL | Status: DC
  Administered 2018-10-11 – 2018-10-14 (×2): 1 via TRANSDERMAL
  Filled 2018-10-11 (×3): qty 1
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
## 2018-10-11 NOTE — Progress Notes (Signed)
Patient ID: Christopher [name], male   DOB: 07/22/1965, 53 y.o.   MRN: [id number] I called his [facility], and updated her.  Violeta Gelinas, MD, MPH, FACS Trauma: [phone] General Surgery: [phone]
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
## 2018-10-12 LAB — GLUCOSE, CAPILLARY
Glucose-Capillary: 108 mg/dL — ABNORMAL HIGH (ref 70–99)
Glucose-Capillary: 139 mg/dL — ABNORMAL HIGH (ref 70–99)
Glucose-Capillary: 133 mg/dL — ABNORMAL HIGH (ref 70–99)
Glucose-Capillary: 120 mg/dL — ABNORMAL HIGH (ref 70–99)
Glucose-Capillary: 134 mg/dL — ABNORMAL HIGH (ref 70–99)

## 2018-10-12 LAB — CBC
HCT: 24.7 % — ABNORMAL LOW (ref 39.0–52.0)
Hemoglobin: 7.2 g/dL — ABNORMAL LOW (ref 13.0–17.0)
MCH: 32 pg (ref 26.0–34.0)
MCHC: 29.1 g/dL — ABNORMAL LOW (ref 30.0–36.0)
MCV: 109.8 fL — ABNORMAL HIGH (ref 80.0–100.0)
Platelets: 384 10*3/uL (ref 150–400)
RBC: 2.25 MIL/uL — ABNORMAL LOW (ref 4.22–5.81)
RDW: 17.8 % — ABNORMAL HIGH (ref 11.5–15.5)
WBC: 6.7 10*3/uL (ref 4.0–10.5)
nRBC: 0 % (ref 0.0–0.2)

## 2018-10-12 NOTE — Progress Notes (Signed)
Was advised in report suprapubic catheter needed to be changed today. Spoke with Dr. Sheliah Hatch and was advised the catheter had been changed since initial placement no need to change catheter today.

## 2018-10-12 NOTE — Progress Notes (Signed)
Follow up - Trauma and Critical Care  Patient Details:    Christopher [name] is an 53 y.o. male.  Lines/tubes : PICC Double Lumen 08/10/18 PICC Left Brachial 50 cm 1 cm (Active)  Indication for Insertion or Continuance of Line Prolonged intravenous therapies 10/12/2018  7:35 AM  Exposed Catheter (cm) 1 cm 08/10/2018  3:39 PM  Site Assessment Clean;Dry;Intact 10/11/2018  8:00 PM  Lumen #1 Status Infusing 10/11/2018  8:00 PM  Lumen #2 Status Flushed;In-line blood sampling system in place;Blood return noted 10/11/2018  8:00 PM  Dressing Type Transparent;Occlusive 10/11/2018  8:00 PM  Dressing Status Clean;Dry;Intact;Antimicrobial disc in place 10/11/2018  8:00 PM  Line Care Connections checked and tightened 10/11/2018  8:00 PM  Line Adjustment (NICU/IV Team Only) No 10/03/2018  8:00 PM  Dressing Intervention New dressing;Antimicrobial disc changed;Securement device changed;Dressing changed 10/05/2018  4:00 PM  Dressing Change Due 10/12/18 10/11/2018  8:00 PM     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)  Surrounding Skin Dry;Intact 10/11/2018  8:00 PM  Tube Status Patent 10/12/2018  4:00 AM  Drainage Appearance None 10/11/2018  4:30 PM  Catheter Position (cm marking) 5 cm 10/12/2018  4:00 AM  Dressing Status Clean;Dry;Intact 10/11/2018  8:00 PM  Dressing Intervention Dressing changed 10/07/2018  8:00 AM  Dressing Type Split gauze 10/11/2018  8:00 PM  Dressing Change Due 09/16/18 09/27/2018  8:00 PM  G Port Intake (mL) 120 ml 10/11/2018  6:00 PM  J Port Intake (mL) 200 ml 10/07/2018  1:38 PM  Output (mL) 75 mL 09/06/2018  6:00 PM     Colostomy LLQ (Active)  Ostomy Pouch 2 piece;Intact 10/11/2018  8:00 PM  Stoma Assessment Pink 10/11/2018  8:00 PM  Peristomal Assessment Intact 10/11/2018  8:00 PM  Treatment Pouch change 10/06/2018  4:00 AM  Output (mL) 150 mL 10/12/2018  4:00 AM     Suprapubic Catheter Non-latex 14 Fr. (Active)  Site Assessment Intact;Clean  10/11/2018  8:00 PM  Dressing Status Clean;Dry;Intact 10/11/2018  8:00 PM  Dressing Type Split gauze 10/11/2018  8:00 PM  Collection Container Standard drainage bag 10/11/2018  8:00 PM  Securement Method Securement Device 10/08/2018  8:00 PM  Indication for Insertion or Continuance of Catheter Bladder outlet obstruction / other urologic reason 10/11/2018  8:00 PM  Output (mL) 125 mL 10/12/2018  7:00 AM    Microbiology/Sepsis markers: Results for orders placed or performed during the hospital encounter of 07/10/18  MRSA PCR Screening     Status: None   Collection Time: 07/11/18  1:47 AM  Result Value Ref Range Status   MRSA by PCR NEGATIVE NEGATIVE Final    Comment:        The GeneXpert MRSA Assay (FDA approved for NASAL specimens only), is one component of a comprehensive MRSA colonization surveillance program. It is not intended to diagnose MRSA infection nor to guide or monitor treatment for MRSA infections. Performed at [facility], [address], [place], [place] [address]   Surgical pcr screen     Status: None   Collection Time: 07/12/18  8:11 AM  Result Value Ref Range Status   MRSA, PCR NEGATIVE NEGATIVE Final   Staphylococcus aureus NEGATIVE NEGATIVE Final    Comment: (NOTE) The Xpert SA Assay (FDA approved for NASAL specimens in patients 67 years of age and older), is one component of a comprehensive surveillance program. It is not intended to diagnose infection nor to guide or monitor treatment. Performed at [facility], [address], [place], [place] [address]   Culture, blood (Routine X 2) w Reflex to ID Panel     Status: None   Collection Time: 07/17/18  8:40 AM  Result Value Ref Range Status   Specimen Description BLOOD RIGHT ARM  Final   Special Requests   Final    BOTTLES DRAWN AEROBIC AND ANAEROBIC Blood Culture adequate volume   Culture   Final    NO GROWTH 5 DAYS Performed at [facility], [address] [place], [place] [address]     Report Status 07/22/2018 FINAL  Final  Culture, blood (Routine X 2) w Reflex to ID Panel     Status: None   Collection Time: 07/17/18  8:52 AM  Result Value Ref Range Status   Specimen Description BLOOD RIGHT HAND  Final   Special Requests   Final    BOTTLES DRAWN AEROBIC ONLY Blood Culture adequate volume   Culture   Final    NO GROWTH 5 DAYS Performed at [facility], [address], [place], [place] [address]    Report Status 07/22/2018 FINAL  Final  Culture, blood (Routine X 2) w Reflex to ID Panel     Status: None   Collection Time: 07/30/18  8:50 AM  Result Value Ref Range Status   Specimen Description BLOOD LEFT HAND  Final   Special Requests   Final    BOTTLES DRAWN AEROBIC ONLY Blood Culture results may not be optimal due to an inadequate volume of blood received in culture bottles Performed at [facility], [address], [place], [place] [address]    Culture NO GROWTH 5 DAYS  Final   Report Status 08/04/2018 FINAL  Final  Culture, blood (Routine X 2) w Reflex to ID Panel     Status: None   Collection Time: 07/30/18  9:56 AM  Result Value Ref Range Status   Specimen Description BLOOD LEFT ARM  Final   Special Requests   Final    BOTTLES DRAWN AEROBIC ONLY Blood Culture results may not be optimal due to an inadequate volume of blood received in culture bottles Performed at [facility], [address], [place], [place] [address]    Culture NO GROWTH 5 DAYS  Final   Report Status 08/04/2018 FINAL  Final  Culture, respiratory (non-expectorated)     Status: None   Collection Time: 07/30/18 11:38 AM  Result Value Ref Range Status   Specimen Description TRACHEAL ASPIRATE  Final   Special Requests Normal  Final   Gram Stain   Final    RARE WBC PRESENT, PREDOMINANTLY PMN RARE GRAM POSITIVE COCCI Performed at [facility], [address] [place], [place] [address]    Culture   Final    MODERATE ACINETOBACTER CALCOACETICUS/BAUMANNII COMPLEX    Report Status 08/01/2018 FINAL  Final   Organism ID, Bacteria ACINETOBACTER CALCOACETICUS/BAUMANNII COMPLEX  Final      Susceptibility   Acinetobacter calcoaceticus/baumannii complex - MIC*    CEFTAZIDIME 8 SENSITIVE Sensitive     CEFTRIAXONE 32 INTERMEDIATE Intermediate     CIPROFLOXACIN <=0.25 SENSITIVE Sensitive     GENTAMICIN <=1 SENSITIVE Sensitive     IMIPENEM <=0.25 SENSITIVE Sensitive     PIP/TAZO <=4 SENSITIVE Sensitive     TRIMETH/SULFA <=20 SENSITIVE Sensitive     CEFEPIME 4 SENSITIVE Sensitive     AMPICILLIN/SULBACTAM <=2 SENSITIVE Sensitive     * MODERATE ACINETOBACTER CALCOACETICUS/BAUMANNII COMPLEX  Culture, respiratory (non-expectorated)     Status: None   Collection Time: 08/13/18  9:22 AM  Result Value Ref Range Status   Specimen Description TRACHEAL ASPIRATE  Final   Special Requests Normal  Final   Gram Stain   Final    FEW WBC PRESENT, PREDOMINANTLY PMN MODERATE GRAM POSITIVE COCCI MODERATE GRAM NEGATIVE RODS    Culture   Final    Consistent with normal respiratory flora. Performed at [facility], [address] [place], [place] [address]    Report Status 08/15/2018 FINAL  Final  Culture, blood (Routine X 2) w Reflex to ID Panel     Status: None   Collection Time: 08/13/18  4:23 PM  Result Value Ref Range Status   Specimen Description BLOOD FOOT  Final   Special Requests   Final    BOTTLES DRAWN AEROBIC ONLY Blood Culture results may not be optimal due to an inadequate volume of blood received in culture bottles   Culture   Final    NO GROWTH 5 DAYS Performed at [facility], [address], [place], [place] [address]    Report Status 08/18/2018 FINAL  Final  Culture, blood (Routine X 2) w Reflex to ID Panel     Status: None   Collection Time: 08/13/18  4:41 PM  Result Value Ref Range Status   Specimen Description BLOOD FOOT  Final   Special Requests   Final    BOTTLES DRAWN AEROBIC ONLY Blood Culture results may not be optimal due to an  inadequate volume of blood received in culture bottles   Culture   Final    NO GROWTH 5 DAYS Performed at [facility], [address], [place], [place] [address]    Report Status 08/18/2018 FINAL  Final  Surgical pcr screen     Status: None   Collection Time: 08/16/18  4:03 AM  Result Value Ref Range Status   MRSA, PCR NEGATIVE NEGATIVE Final   Staphylococcus aureus NEGATIVE NEGATIVE Final    Comment: (NOTE) The Xpert SA Assay (FDA approved for NASAL specimens in patients 67 years of age and older), is one component of a comprehensive surveillance program. It is not intended to diagnose infection nor to guide or monitor treatment. Performed at [facility], [address], [place], [place] [address]   Culture, blood (routine x 2)     Status: Abnormal   Collection Time: 08/18/18 11:50 AM  Result Value Ref Range Status   Specimen Description BLOOD RIGHT HAND  Final   Special Requests   Final    BOTTLES DRAWN AEROBIC ONLY Blood Culture adequate volume   Culture  Setup Time   Final    GRAM NEGATIVE RODS AEROBIC BOTTLE ONLY CRITICAL RESULT CALLED TO, READ BACK BY AND VERIFIED WITH: V. BRYK,PHARMD [id number] 08/19/2018 T. TYSOR    Culture (A)  Final    SERRATIA MARCESCENS PSEUDOMONAS PUTIDA CRITICAL RESULT CALLED TO, READ BACK BY AND VERIFIED WITH: PHARMD M LORI [id number] AT 757 AM BY CM Performed at [facility], [address] [place], [place] [address]    Report Status 08/22/2018 FINAL  Final   Organism ID, Bacteria SERRATIA MARCESCENS  Final   Organism ID, Bacteria PSEUDOMONAS PUTIDA  Final      Susceptibility   Pseudomonas putida - MIC*    CEFTAZIDIME 16 INTERMEDIATE Intermediate     CIPROFLOXACIN 0.5 SENSITIVE Sensitive     GENTAMICIN <=1 SENSITIVE Sensitive  IMIPENEM 2 SENSITIVE Sensitive     PIP/TAZO >=128 RESISTANT Resistant     CEFEPIME 8 SENSITIVE Sensitive     * PSEUDOMONAS PUTIDA   Serratia marcescens - MIC*    CEFAZOLIN >=64 RESISTANT Resistant      CEFEPIME <=1 SENSITIVE Sensitive     CEFTAZIDIME <=1 SENSITIVE Sensitive     CEFTRIAXONE <=1 SENSITIVE Sensitive     CIPROFLOXACIN <=0.25 SENSITIVE Sensitive     GENTAMICIN <=1 SENSITIVE Sensitive     TRIMETH/SULFA <=20 SENSITIVE Sensitive     * SERRATIA MARCESCENS  Blood Culture ID Panel (Reflexed)     Status: Abnormal   Collection Time: 08/18/18 11:50 AM  Result Value Ref Range Status   Enterococcus species NOT DETECTED NOT DETECTED Final   Listeria monocytogenes NOT DETECTED NOT DETECTED Final   Staphylococcus species NOT DETECTED NOT DETECTED Final   Staphylococcus aureus (BCID) NOT DETECTED NOT DETECTED Final   Streptococcus species NOT DETECTED NOT DETECTED Final   Streptococcus agalactiae NOT DETECTED NOT DETECTED Final   Streptococcus pneumoniae NOT DETECTED NOT DETECTED Final   Streptococcus pyogenes NOT DETECTED NOT DETECTED Final   Acinetobacter baumannii NOT DETECTED NOT DETECTED Final   Enterobacteriaceae species DETECTED (A) NOT DETECTED Final    Comment: Enterobacteriaceae represent a large family of gram-negative bacteria, not a single organism. CRITICAL RESULT CALLED TO, READ BACK BY AND VERIFIED WITH: V. Joan Mayans 1610 08/19/2018 T. TYSOR    Enterobacter cloacae complex NOT DETECTED NOT DETECTED Final   Escherichia coli NOT DETECTED NOT DETECTED Final   Klebsiella oxytoca NOT DETECTED NOT DETECTED Final   Klebsiella pneumoniae NOT DETECTED NOT DETECTED Final   Proteus species NOT DETECTED NOT DETECTED Final   Serratia marcescens DETECTED (A) NOT DETECTED Final    Comment: CRITICAL RESULT CALLED TO, READ BACK BY AND VERIFIED WITH: VJoan Mayans [id number] 08/19/2018 T. TYSOR    Carbapenem resistance NOT DETECTED NOT DETECTED Final   Haemophilus influenzae NOT DETECTED NOT DETECTED Final   Neisseria meningitidis NOT DETECTED NOT DETECTED Final   Pseudomonas aeruginosa NOT DETECTED NOT DETECTED Final   Candida albicans NOT DETECTED NOT DETECTED Final   Candida  glabrata NOT DETECTED NOT DETECTED Final   Candida krusei NOT DETECTED NOT DETECTED Final   Candida parapsilosis NOT DETECTED NOT DETECTED Final   Candida tropicalis NOT DETECTED NOT DETECTED Final    Comment: Performed at [facility], [address] [place], [place] [address]  Culture, blood (routine x 2)     Status: None   Collection Time: 08/18/18 11:58 AM  Result Value Ref Range Status   Specimen Description BLOOD RIGHT ANTECUBITAL  Final   Special Requests   Final    BOTTLES DRAWN AEROBIC ONLY Blood Culture adequate volume   Culture   Final    NO GROWTH 5 DAYS Performed at [facility], [address], [place], [place] [address]    Report Status 08/23/2018 FINAL  Final  MRSA PCR Screening     Status: None   Collection Time: 09/21/18  1:30 PM  Result Value Ref Range Status   MRSA by PCR NEGATIVE NEGATIVE Final    Comment:        The GeneXpert MRSA Assay (FDA approved for NASAL specimens only), is one component of a comprehensive MRSA colonization surveillance program. It is not intended to diagnose MRSA infection nor to guide or monitor treatment for MRSA infections. Performed at [facility], [address] [place], [place] [address]  Culture, blood (routine x 2)     Status: Abnormal   Collection Time: 09/24/18  3:55 PM  Result Value Ref Range Status   Specimen Description BLOOD RIGHT ANTECUBITAL  Final   Special Requests   Final    BOTTLES DRAWN AEROBIC ONLY Blood Culture adequate volume   Culture  Setup Time   Final    GRAM POSITIVE COCCI IN CLUSTERS AEROBIC BOTTLE ONLY CRITICAL RESULT CALLED TO, READ BACK BY AND VERIFIED WITH: J [place] PHARMD 09/25/18 1751 JDW    Culture (A)  Final    STAPHYLOCOCCUS SPECIES (COAGULASE NEGATIVE) THE SIGNIFICANCE OF ISOLATING THIS ORGANISM FROM A SINGLE SET OF BLOOD CULTURES WHEN MULTIPLE SETS ARE DRAWN IS UNCERTAIN. PLEASE NOTIFY THE MICROBIOLOGY DEPARTMENT WITHIN ONE WEEK IF SPECIATION AND SENSITIVITIES ARE  REQUIRED. Performed at [facility], [address] [place], [place] [address]    Report Status 09/26/2018 FINAL  Final  Blood Culture ID Panel (Reflexed)     Status: Abnormal   Collection Time: 09/24/18  3:55 PM  Result Value Ref Range Status   Enterococcus species NOT DETECTED NOT DETECTED Final   Listeria monocytogenes NOT DETECTED NOT DETECTED Final   Staphylococcus species DETECTED (A) NOT DETECTED Final    Comment: Methicillin (oxacillin) resistant coagulase negative staphylococcus. Possible blood culture contaminant (unless isolated from more than one blood culture draw or clinical case suggests pathogenicity). No antibiotic treatment is indicated for blood  culture contaminants. CRITICAL RESULT CALLED TO, READ BACK BY AND VERIFIED WITH: J [place] PHARMD 09/25/18 1751 JDW    Staphylococcus aureus (BCID) NOT DETECTED NOT DETECTED Final   Methicillin resistance DETECTED (A) NOT DETECTED Final    Comment: CRITICAL RESULT CALLED TO, READ BACK BY AND VERIFIED WITH: J [place] PHARMD 09/25/18 1751 JDW    Streptococcus species NOT DETECTED NOT DETECTED Final   Streptococcus agalactiae NOT DETECTED NOT DETECTED Final   Streptococcus pneumoniae NOT DETECTED NOT DETECTED Final   Streptococcus pyogenes NOT DETECTED NOT DETECTED Final   Acinetobacter baumannii NOT DETECTED NOT DETECTED Final   Enterobacteriaceae species NOT DETECTED NOT DETECTED Final   Enterobacter cloacae complex NOT DETECTED NOT DETECTED Final   Escherichia coli NOT DETECTED NOT DETECTED Final   Klebsiella oxytoca NOT DETECTED NOT DETECTED Final   Klebsiella pneumoniae NOT DETECTED NOT DETECTED Final   Proteus species NOT DETECTED NOT DETECTED Final   Serratia marcescens NOT DETECTED NOT DETECTED Final   Haemophilus influenzae NOT DETECTED NOT DETECTED Final   Neisseria meningitidis NOT DETECTED NOT DETECTED Final   Pseudomonas aeruginosa NOT DETECTED NOT DETECTED Final   Candida albicans NOT DETECTED NOT DETECTED  Final   Candida glabrata NOT DETECTED NOT DETECTED Final   Candida krusei NOT DETECTED NOT DETECTED Final   Candida parapsilosis NOT DETECTED NOT DETECTED Final   Candida tropicalis NOT DETECTED NOT DETECTED Final    Comment: Performed at [facility], [address] [place], [place] [address]  Culture, blood (routine x 2)     Status: None   Collection Time: 09/24/18  3:59 PM  Result Value Ref Range Status   Specimen Description BLOOD HAND  Final   Special Requests   Final    BOTTLES DRAWN AEROBIC ONLY Blood Culture adequate volume   Culture   Final    NO GROWTH 5 DAYS Performed at [facility], [address] [place], [place] [address]    Report Status 09/29/2018 FINAL  Final  Culture, Urine  Status: Abnormal   Collection Time: 09/24/18  4:22 PM  Result Value Ref Range Status   Specimen Description URINE, RANDOM  Final   Special Requests   Final    NONE Performed at [facility], [address] [place], [place] [address]    Culture >=100,000 COLONIES/mL ENTEROCOCCUS FAECALIS (A)  Final   Report Status 09/26/2018 FINAL  Final   Organism ID, Bacteria ENTEROCOCCUS FAECALIS (A)  Final      Susceptibility   Enterococcus faecalis - MIC*    AMPICILLIN <=2 SENSITIVE Sensitive     LEVOFLOXACIN >=8 RESISTANT Resistant     NITROFURANTOIN <=16 SENSITIVE Sensitive     VANCOMYCIN 1 SENSITIVE Sensitive     * >=100,000 COLONIES/mL ENTEROCOCCUS FAECALIS  Culture, Urine     Status: None   Collection Time: 09/28/18  6:53 PM  Result Value Ref Range Status   Specimen Description URINE, CATHETERIZED  Final   Special Requests Immunocompromised  Final   Culture   Final    NO GROWTH Performed at [facility], [address] [place], [place] [address]    Report Status 09/29/2018 FINAL  Final  Culture, Urine     Status: None   Collection Time: 10/06/18  8:59 AM  Result Value Ref Range Status   Specimen Description URINE, RANDOM  Final   Special Requests NONE   Final   Culture   Final    NO GROWTH Performed at [facility], [address] [place], [place] [address]    Report Status 10/07/2018 FINAL  Final  Culture, blood (routine x 2)     Status: None   Collection Time: 10/06/18  9:55 AM  Result Value Ref Range Status   Specimen Description BLOOD RIGHT FOREARM  Final   Special Requests   Final    AEROBIC BOTTLE ONLY Blood Culture results may not be optimal due to an excessive volume of blood received in culture bottles   Culture   Final    NO GROWTH 5 DAYS Performed at [facility], [address] [place], [place] [address]    Report Status 10/11/2018 FINAL  Final  Culture, blood (routine x 2)     Status: None   Collection Time: 10/06/18 10:00 AM  Result Value Ref Range Status   Specimen Description BLOOD RIGHT ANTECUBITAL  Final   Special Requests   Final    AEROBIC BOTTLE ONLY Blood Culture results may not be optimal due to an excessive volume of blood received in culture bottles   Culture   Final    NO GROWTH 5 DAYS Performed at [facility], [address] [place], [place] [address]    Report Status 10/11/2018 FINAL  Final    Anti-infectives:  Anti-infectives (From admission, onward)   Start     Dose/Rate Route Frequency Ordered Stop   10/03/18 0000  ceFAZolin (ANCEF) IVPB 2g/100 mL premix     2 g 200 mL/hr over 30 Minutes Intravenous To Surgery 10/02/18 1822 10/03/18 1348   09/27/18 0830  ampicillin (OMNIPEN) 2 g in sodium chloride 0.9 % 100 mL IVPB  Status:  Discontinued     2 g 300 mL/hr over 20 Minutes Intravenous Every 6 hours 09/27/18 0822 10/05/18 0900   09/25/18 2000  vancomycin (VANCOCIN) 1,250 mg in sodium chloride 0.9 % 250 mL IVPB  Status:  Discontinued     1,250 mg 166.7 mL/hr over 90 Minutes Intravenous Every 12 hours 09/25/18  1854 09/27/18 0822   09/25/18 0800  ceFEPIme (MAXIPIME) 2 g in sodium chloride 0.9 % 100 mL IVPB  Status:  Discontinued     2 g 200 mL/hr over 30 Minutes Intravenous  Every 8 hours 09/25/18 0750 09/27/18 0822   09/19/18 1257  polymyxin B 500,000 Units, bacitracin 50,000 Units in sodium chloride 0.9 % 500 mL irrigation  Status:  Discontinued       As needed 09/19/18 1257 09/19/18 1608   09/19/18 1100  ceFAZolin (ANCEF) IVPB 2g/100 mL premix     2 g 200 mL/hr over 30 Minutes Intravenous To Surgery 09/18/18 2055 09/19/18 1334   08/29/18 1512  polymyxin B 500,000 Units, bacitracin 50,000 Units in sodium chloride 0.9 % 500 mL irrigation  Status:  Discontinued       As needed 08/29/18 1512 08/29/18 1721   08/23/18 1200  meropenem (MERREM) 1 g in sodium chloride 0.9 % 100 mL IVPB     1 g 200 mL/hr over 30 Minutes Intravenous Every 8 hours 08/23/18 1122 09/05/18 1311   08/22/18 1400  ceFEPIme (MAXIPIME) 2 g in sodium chloride 0.9 % 100 mL IVPB  Status:  Discontinued     2 g 200 mL/hr over 30 Minutes Intravenous Every 8 hours 08/22/18 0836 08/22/18 1027   08/22/18 1200  meropenem (MERREM) 2 g in sodium chloride 0.9 % 100 mL IVPB  Status:  Discontinued     2 g 200 mL/hr over 30 Minutes Intravenous Every 8 hours 08/22/18 1027 08/23/18 1122   08/21/18 1400  ceFEPIme (MAXIPIME) 1 g in sodium chloride 0.9 % 100 mL IVPB  Status:  Discontinued     1 g 200 mL/hr over 30 Minutes Intravenous Every 8 hours 08/21/18 1042 08/22/18 0836   08/19/18 1030  cefTRIAXone (ROCEPHIN) 2 g in sodium chloride 0.9 % 100 mL IVPB  Status:  Discontinued     2 g 200 mL/hr over 30 Minutes Intravenous Every 24 hours 08/19/18 1017 08/21/18 1042   08/13/18 1400  piperacillin-tazobactam (ZOSYN) IVPB 3.375 g  Status:  Discontinued     3.375 g 12.5 mL/hr over 240 Minutes Intravenous Every 8 hours 08/13/18 0928 08/16/18 0854   08/13/18 1354  polymyxin B 500,000 Units, bacitracin 50,000 Units in sodium chloride 0.9 % 500 mL irrigation  Status:  Discontinued       As needed 08/13/18 1354 08/13/18 1538   08/13/18 0930  piperacillin-tazobactam (ZOSYN) IVPB 3.375 g     3.375 g 100 mL/hr over 30  Minutes Intravenous  Once 08/13/18 0928 08/13/18 1200   08/06/18 0801  polymyxin B 500,000 Units, bacitracin 50,000 Units in sodium chloride 0.9 % 500 mL irrigation  Status:  Discontinued       As needed 08/06/18 0803 08/06/18 0951   08/06/18 0600  ceFAZolin (ANCEF) IVPB 2g/100 mL premix  Status:  Discontinued     2 g 200 mL/hr over 30 Minutes Intravenous On call to O.R. 08/05/18 2241 08/05/18 2241   08/06/18 0600  ceFAZolin (ANCEF) IVPB 2g/100 mL premix  Status:  Discontinued     2 g 200 mL/hr over 30 Minutes Intravenous To Short Stay 08/05/18 2241 08/06/18 1024   08/01/18 2200  Ampicillin-Sulbactam (UNASYN) 3 g in sodium chloride 0.9 % 100 mL IVPB     3 g 200 mL/hr over 30 Minutes Intravenous Every 6 hours 08/01/18 2146 08/08/18 2235   08/01/18 1630  piperacillin-tazobactam (ZOSYN) IVPB 3.375 g  Status:  Discontinued     3.375 g  12.5 mL/hr over 240 Minutes Intravenous Every 8 hours 08/01/18 1624 08/01/18 2139   07/25/18 1508  polymyxin B 500,000 Units, bacitracin 50,000 Units in sodium chloride 0.9 % 500 mL irrigation  Status:  Discontinued       As needed 07/25/18 1509 07/25/18 1750   07/25/18 1445  piperacillin-tazobactam (ZOSYN) IVPB 3.375 g     3.375 g 100 mL/hr over 30 Minutes Intravenous STAT 07/25/18 1443 07/25/18 1620   07/25/18 0600  ceFAZolin (ANCEF) 3 g in dextrose 5 % 50 mL IVPB  Status:  Discontinued     3 g 100 mL/hr over 30 Minutes Intravenous To ShortStay Surgical 07/24/18 1735 07/25/18 1753   07/18/18 1444  polymyxin B 500,000 Units, bacitracin 50,000 Units in sodium chloride 0.9 % 500 mL irrigation  Status:  Discontinued       As needed 07/18/18 1445 07/18/18 1738   07/17/18 0900  piperacillin-tazobactam (ZOSYN) IVPB 3.375 g  Status:  Discontinued     3.375 g 12.5 mL/hr over 240 Minutes Intravenous Every 8 hours 07/17/18 0810 07/27/18 0806   07/12/18 1430  metronidazole (FLAGYL) IVPB 500 mg  Status:  Discontinued     500 mg 100 mL/hr over 60 Minutes Intravenous To  Surgery 07/12/18 1427 07/12/18 1608   07/12/18 1430  cefTRIAXone (ROCEPHIN) 2 g in sodium chloride 0.9 % 100 mL IVPB  Status:  Discontinued     2 g 200 mL/hr over 30 Minutes Intravenous To Surgery 07/12/18 1427 07/12/18 1608   07/12/18 1245  tobramycin (NEBCIN) powder  Status:  Discontinued       As needed 07/12/18 1245 07/12/18 1542   07/12/18 1243  vancomycin (VANCOCIN) powder  Status:  Discontinued       As needed 07/12/18 1244 07/12/18 1542   07/11/18 1330  cefTRIAXone (ROCEPHIN) 2 g in sodium chloride 0.9 % 100 mL IVPB  Status:  Discontinued     2 g 200 mL/hr over 30 Minutes Intravenous Every 24 hours 07/11/18 1259 07/17/18 0810   07/11/18 1300  metroNIDAZOLE (FLAGYL) IVPB 500 mg  Status:  Discontinued     500 mg 100 mL/hr over 60 Minutes Intravenous Every 8 hours 07/11/18 1259 07/17/18 0810   07/11/18 0400  ceFAZolin (ANCEF) IVPB 2g/100 mL premix  Status:  Discontinued     2 g 200 mL/hr over 30 Minutes Intravenous Every 8 hours 07/10/18 2109 07/11/18 1259   07/10/18 1815  ceFAZolin (ANCEF) IVPB 2g/100 mL premix  Status:  Discontinued     2 g 200 mL/hr over 30 Minutes Intravenous  Once 07/10/18 1814 07/10/18 2339      Best Practice/Protocols:  VTE Prophylaxis: Lovenox (prophylaxtic dose) and Mechanical   Consults: Treatment Team:  Md, Trauma, MD Haddix, Gillie Manners, MD Dillingham, Alena Bills, DO    Events:  Chief

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
## 2018-10-13 LAB — GLUCOSE, CAPILLARY
Glucose-Capillary: 117 mg/dL — ABNORMAL HIGH (ref 70–99)
Glucose-Capillary: 124 mg/dL — ABNORMAL HIGH (ref 70–99)
Glucose-Capillary: 131 mg/dL — ABNORMAL HIGH (ref 70–99)
Glucose-Capillary: 145 mg/dL — ABNORMAL HIGH (ref 70–99)
Glucose-Capillary: 149 mg/dL — ABNORMAL HIGH (ref 70–99)
Glucose-Capillary: 150 mg/dL — ABNORMAL HIGH (ref 70–99)
Glucose-Capillary: 97 mg/dL (ref 70–99)

## 2018-10-13 LAB — ZINC: Zinc: 84 ug/dL (ref 56–134)

## 2018-10-13 NOTE — Progress Notes (Signed)
Follow up - Trauma and Critical Care  Patient Details:    Christopher [name] is an 53 y.o. male.  Lines/tubes : PICC Double Lumen 08/10/18 PICC Left Brachial 50 cm 1 cm (Active)  Indication for Insertion or Continuance of Line Prolonged intravenous therapies 10/12/2018  7:35 AM  Exposed Catheter (cm) 1 cm 08/10/2018  3:39 PM  Site Assessment Clean;Dry;Intact 10/11/2018  8:00 PM  Lumen #1 Status Infusing 10/11/2018  8:00 PM  Lumen #2 Status Flushed;In-line blood sampling system in place;Blood return noted 10/11/2018  8:00 PM  Dressing Type Transparent;Occlusive 10/11/2018  8:00 PM  Dressing Status Clean;Dry;Intact;Antimicrobial disc in place 10/11/2018  8:00 PM  Line Care Connections checked and tightened 10/11/2018  8:00 PM  Line Adjustment (NICU/IV Team Only) No 10/03/2018  8:00 PM  Dressing Intervention New dressing;Antimicrobial disc changed;Securement device changed;Dressing changed 10/05/2018  4:00 PM  Dressing Change Due 10/12/18 10/11/2018  8:00 PM     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)  Surrounding Skin Dry;Intact 10/11/2018  8:00 PM  Tube Status Patent 10/12/2018  4:00 AM  Drainage Appearance None 10/11/2018  4:30 PM  Catheter Position (cm marking) 5 cm 10/12/2018  4:00 AM  Dressing Status Clean;Dry;Intact 10/11/2018  8:00 PM  Dressing Intervention Dressing changed 10/07/2018  8:00 AM  Dressing Type Split gauze 10/11/2018  8:00 PM  Dressing Change Due 09/16/18 09/27/2018  8:00 PM  G Port Intake (mL) 120 ml 10/11/2018  6:00 PM  J Port Intake (mL) 200 ml 10/07/2018  1:38 PM  Output (mL) 75 mL 09/06/2018  6:00 PM     Colostomy LLQ (Active)  Ostomy Pouch 2 piece;Intact 10/11/2018  8:00 PM  Stoma Assessment Pink 10/11/2018  8:00 PM  Peristomal Assessment Intact 10/11/2018  8:00 PM  Treatment Pouch change 10/06/2018  4:00 AM  Output (mL) 150 mL 10/12/2018  4:00 AM     Suprapubic Catheter Non-latex 14 Fr. (Active)  Site Assessment Intact;Clean  10/11/2018  8:00 PM  Dressing Status Clean;Dry;Intact 10/11/2018  8:00 PM  Dressing Type Split gauze 10/11/2018  8:00 PM  Collection Container Standard drainage bag 10/11/2018  8:00 PM  Securement Method Securement Device 10/08/2018  8:00 PM  Indication for Insertion or Continuance of Catheter Bladder outlet obstruction / other urologic reason 10/11/2018  8:00 PM  Output (mL) 125 mL 10/12/2018  7:00 AM    Microbiology/Sepsis markers: Results for orders placed or performed during the hospital encounter of 07/10/18  MRSA PCR Screening     Status: None   Collection Time: 07/11/18  1:47 AM  Result Value Ref Range Status   MRSA by PCR NEGATIVE NEGATIVE Final    Comment:        The GeneXpert MRSA Assay (FDA approved for NASAL specimens only), is one component of a comprehensive MRSA colonization surveillance program. It is not intended to diagnose MRSA infection nor to guide or monitor treatment for MRSA infections. Performed at [facility], [address] [place], [place] [address]   Surgical pcr screen     Status: None   Collection Time: 07/12/18  8:11 AM  Result Value Ref Range Status   MRSA, PCR NEGATIVE NEGATIVE Final   Staphylococcus aureus NEGATIVE NEGATIVE Final    Comment: (NOTE) The Xpert SA Assay (FDA approved for NASAL specimens in patients 76 years of age and older), is one component of a comprehensive surveillance program. It is not intended to diagnose infection nor to guide or monitor treatment. Performed at [facility], [address], [place], [place] [address]   Culture, blood (Routine X 2) w Reflex to ID Panel     Status: None   Collection Time: 07/17/18  8:40 AM  Result Value Ref Range Status   Specimen Description BLOOD RIGHT ARM  Final   Special Requests   Final    BOTTLES DRAWN AEROBIC AND ANAEROBIC Blood Culture adequate volume   Culture   Final    NO GROWTH 5 DAYS Performed at [facility], [address] [place], [place] [address]     Report Status 07/22/2018 FINAL  Final  Culture, blood (Routine X 2) w Reflex to ID Panel     Status: None   Collection Time: 07/17/18  8:52 AM  Result Value Ref Range Status   Specimen Description BLOOD RIGHT HAND  Final   Special Requests   Final    BOTTLES DRAWN AEROBIC ONLY Blood Culture adequate volume   Culture   Final    NO GROWTH 5 DAYS Performed at [facility], [address], [place], [place] [address]    Report Status 07/22/2018 FINAL  Final  Culture, blood (Routine X 2) w Reflex to ID Panel     Status: None   Collection Time: 07/30/18  8:50 AM  Result Value Ref Range Status   Specimen Description BLOOD LEFT HAND  Final   Special Requests   Final    BOTTLES DRAWN AEROBIC ONLY Blood Culture results may not be optimal due to an inadequate volume of blood received in culture bottles Performed at [facility], [address], [place], [place] [address]    Culture NO GROWTH 5 DAYS  Final   Report Status 08/04/2018 FINAL  Final  Culture, blood (Routine X 2) w Reflex to ID Panel     Status: None   Collection Time: 07/30/18  9:56 AM  Result Value Ref Range Status   Specimen Description BLOOD LEFT ARM  Final   Special Requests   Final    BOTTLES DRAWN AEROBIC ONLY Blood Culture results may not be optimal due to an inadequate volume of blood received in culture bottles Performed at [facility], [address], [place], [place] [address]    Culture NO GROWTH 5 DAYS  Final   Report Status 08/04/2018 FINAL  Final  Culture, respiratory (non-expectorated)     Status: None   Collection Time: 07/30/18 11:38 AM  Result Value Ref Range Status   Specimen Description TRACHEAL ASPIRATE  Final   Special Requests Normal  Final   Gram Stain   Final    RARE WBC PRESENT, PREDOMINANTLY PMN RARE GRAM POSITIVE COCCI Performed at [facility], [address] [place], [place] [address]    Culture   Final    MODERATE ACINETOBACTER CALCOACETICUS/BAUMANNII COMPLEX    Report Status 08/01/2018 FINAL  Final   Organism ID, Bacteria ACINETOBACTER CALCOACETICUS/BAUMANNII COMPLEX  Final      Susceptibility   Acinetobacter calcoaceticus/baumannii complex - MIC*    CEFTAZIDIME 8 SENSITIVE Sensitive     CEFTRIAXONE 32 INTERMEDIATE Intermediate     CIPROFLOXACIN <=0.25 SENSITIVE Sensitive     GENTAMICIN <=1 SENSITIVE Sensitive     IMIPENEM <=0.25 SENSITIVE Sensitive     PIP/TAZO <=4 SENSITIVE Sensitive     TRIMETH/SULFA <=20 SENSITIVE Sensitive     CEFEPIME 4 SENSITIVE Sensitive     AMPICILLIN/SULBACTAM <=2 SENSITIVE Sensitive     * MODERATE ACINETOBACTER CALCOACETICUS/BAUMANNII COMPLEX  Culture, respiratory (non-expectorated)     Status: None   Collection Time: 08/13/18  9:22 AM  Result Value Ref Range Status   Specimen Description TRACHEAL ASPIRATE  Final   Special Requests Normal  Final   Gram Stain   Final    FEW WBC PRESENT, PREDOMINANTLY PMN MODERATE GRAM POSITIVE COCCI MODERATE GRAM NEGATIVE RODS    Culture   Final    Consistent with normal respiratory flora. Performed at [facility], [address] [place], [place] [address]    Report Status 08/15/2018 FINAL  Final  Culture, blood (Routine X 2) w Reflex to ID Panel     Status: None   Collection Time: 08/13/18  4:23 PM  Result Value Ref Range Status   Specimen Description BLOOD FOOT  Final   Special Requests   Final    BOTTLES DRAWN AEROBIC ONLY Blood Culture results may not be optimal due to an inadequate volume of blood received in culture bottles   Culture   Final    NO GROWTH 5 DAYS Performed at [facility], [address], [place], [place] [address]    Report Status 08/18/2018 FINAL  Final  Culture, blood (Routine X 2) w Reflex to ID Panel     Status: None   Collection Time: 08/13/18  4:41 PM  Result Value Ref Range Status   Specimen Description BLOOD FOOT  Final   Special Requests   Final    BOTTLES DRAWN AEROBIC ONLY Blood Culture results may not be optimal due to an  inadequate volume of blood received in culture bottles   Culture   Final    NO GROWTH 5 DAYS Performed at [facility], [address], [place], [place] [address]    Report Status 08/18/2018 FINAL  Final  Surgical pcr screen     Status: None   Collection Time: 08/16/18  4:03 AM  Result Value Ref Range Status   MRSA, PCR NEGATIVE NEGATIVE Final   Staphylococcus aureus NEGATIVE NEGATIVE Final    Comment: (NOTE) The Xpert SA Assay (FDA approved for NASAL specimens in patients 67 years of age and older), is one component of a comprehensive surveillance program. It is not intended to diagnose infection nor to guide or monitor treatment. Performed at [facility], [address], [place], [place] [address]   Culture, blood (routine x 2)     Status: Abnormal   Collection Time: 08/18/18 11:50 AM  Result Value Ref Range Status   Specimen Description BLOOD RIGHT HAND  Final   Special Requests   Final    BOTTLES DRAWN AEROBIC ONLY Blood Culture adequate volume   Culture  Setup Time   Final    GRAM NEGATIVE RODS AEROBIC BOTTLE ONLY CRITICAL RESULT CALLED TO, READ BACK BY AND VERIFIED WITH: V. BRYK,PHARMD [id number] 08/19/2018 T. TYSOR    Culture (A)  Final    SERRATIA MARCESCENS PSEUDOMONAS PUTIDA CRITICAL RESULT CALLED TO, READ BACK BY AND VERIFIED WITH: PHARMD M LORI [id number] AT 757 AM BY CM Performed at [facility], [address] [place], [place] [address]    Report Status 08/22/2018 FINAL  Final   Organism ID, Bacteria SERRATIA MARCESCENS  Final   Organism ID, Bacteria PSEUDOMONAS PUTIDA  Final      Susceptibility   Pseudomonas putida - MIC*    CEFTAZIDIME 16 INTERMEDIATE Intermediate     CIPROFLOXACIN 0.5 SENSITIVE Sensitive     GENTAMICIN <=1 SENSITIVE Sensitive  IMIPENEM 2 SENSITIVE Sensitive     PIP/TAZO >=128 RESISTANT Resistant     CEFEPIME 8 SENSITIVE Sensitive     * PSEUDOMONAS PUTIDA   Serratia marcescens - MIC*    CEFAZOLIN >=64 RESISTANT Resistant      CEFEPIME <=1 SENSITIVE Sensitive     CEFTAZIDIME <=1 SENSITIVE Sensitive     CEFTRIAXONE <=1 SENSITIVE Sensitive     CIPROFLOXACIN <=0.25 SENSITIVE Sensitive     GENTAMICIN <=1 SENSITIVE Sensitive     TRIMETH/SULFA <=20 SENSITIVE Sensitive     * SERRATIA MARCESCENS  Blood Culture ID Panel (Reflexed)     Status: Abnormal   Collection Time: 08/18/18 11:50 AM  Result Value Ref Range Status   Enterococcus species NOT DETECTED NOT DETECTED Final   Listeria monocytogenes NOT DETECTED NOT DETECTED Final   Staphylococcus species NOT DETECTED NOT DETECTED Final   Staphylococcus aureus (BCID) NOT DETECTED NOT DETECTED Final   Streptococcus species NOT DETECTED NOT DETECTED Final   Streptococcus agalactiae NOT DETECTED NOT DETECTED Final   Streptococcus pneumoniae NOT DETECTED NOT DETECTED Final   Streptococcus pyogenes NOT DETECTED NOT DETECTED Final   Acinetobacter baumannii NOT DETECTED NOT DETECTED Final   Enterobacteriaceae species DETECTED (A) NOT DETECTED Final    Comment: Enterobacteriaceae represent a large family of gram-negative bacteria, not a single organism. CRITICAL RESULT CALLED TO, READ BACK BY AND VERIFIED WITH: V. Joan Mayans 1610 08/19/2018 T. TYSOR    Enterobacter cloacae complex NOT DETECTED NOT DETECTED Final   Escherichia coli NOT DETECTED NOT DETECTED Final   Klebsiella oxytoca NOT DETECTED NOT DETECTED Final   Klebsiella pneumoniae NOT DETECTED NOT DETECTED Final   Proteus species NOT DETECTED NOT DETECTED Final   Serratia marcescens DETECTED (A) NOT DETECTED Final    Comment: CRITICAL RESULT CALLED TO, READ BACK BY AND VERIFIED WITH: VJoan Mayans [id number] 08/19/2018 T. TYSOR    Carbapenem resistance NOT DETECTED NOT DETECTED Final   Haemophilus influenzae NOT DETECTED NOT DETECTED Final   Neisseria meningitidis NOT DETECTED NOT DETECTED Final   Pseudomonas aeruginosa NOT DETECTED NOT DETECTED Final   Candida albicans NOT DETECTED NOT DETECTED Final   Candida  glabrata NOT DETECTED NOT DETECTED Final   Candida krusei NOT DETECTED NOT DETECTED Final   Candida parapsilosis NOT DETECTED NOT DETECTED Final   Candida tropicalis NOT DETECTED NOT DETECTED Final    Comment: Performed at [facility], [address] [place], [place] [address]  Culture, blood (routine x 2)     Status: None   Collection Time: 08/18/18 11:58 AM  Result Value Ref Range Status   Specimen Description BLOOD RIGHT ANTECUBITAL  Final   Special Requests   Final    BOTTLES DRAWN AEROBIC ONLY Blood Culture adequate volume   Culture   Final    NO GROWTH 5 DAYS Performed at [facility], [address], [place], [place] [address]    Report Status 08/23/2018 FINAL  Final  MRSA PCR Screening     Status: None   Collection Time: 09/21/18  1:30 PM  Result Value Ref Range Status   MRSA by PCR NEGATIVE NEGATIVE Final    Comment:        The GeneXpert MRSA Assay (FDA approved for NASAL specimens only), is one component of a comprehensive MRSA colonization surveillance program. It is not intended to diagnose MRSA infection nor to guide or monitor treatment for MRSA infections. Performed at [facility], [address] [place], [place] [address]  Culture, blood (routine x 2)     Status: Abnormal   Collection Time: 09/24/18  3:55 PM  Result Value Ref Range Status   Specimen Description BLOOD RIGHT ANTECUBITAL  Final   Special Requests   Final    BOTTLES DRAWN AEROBIC ONLY Blood Culture adequate volume   Culture  Setup Time   Final    GRAM POSITIVE COCCI IN CLUSTERS AEROBIC BOTTLE ONLY CRITICAL RESULT CALLED TO, READ BACK BY AND VERIFIED WITH: J [place] PHARMD 09/25/18 1751 JDW    Culture (A)  Final    STAPHYLOCOCCUS SPECIES (COAGULASE NEGATIVE) THE SIGNIFICANCE OF ISOLATING THIS ORGANISM FROM A SINGLE SET OF BLOOD CULTURES WHEN MULTIPLE SETS ARE DRAWN IS UNCERTAIN. PLEASE NOTIFY THE MICROBIOLOGY DEPARTMENT WITHIN ONE WEEK IF SPECIATION AND SENSITIVITIES ARE  REQUIRED. Performed at [facility], [address] [place], [place] [address]    Report Status 09/26/2018 FINAL  Final  Blood Culture ID Panel (Reflexed)     Status: Abnormal   Collection Time: 09/24/18  3:55 PM  Result Value Ref Range Status   Enterococcus species NOT DETECTED NOT DETECTED Final   Listeria monocytogenes NOT DETECTED NOT DETECTED Final   Staphylococcus species DETECTED (A) NOT DETECTED Final    Comment: Methicillin (oxacillin) resistant coagulase negative staphylococcus. Possible blood culture contaminant (unless isolated from more than one blood culture draw or clinical case suggests pathogenicity). No antibiotic treatment is indicated for blood  culture contaminants. CRITICAL RESULT CALLED TO, READ BACK BY AND VERIFIED WITH: J [place] PHARMD 09/25/18 1751 JDW    Staphylococcus aureus (BCID) NOT DETECTED NOT DETECTED Final   Methicillin resistance DETECTED (A) NOT DETECTED Final    Comment: CRITICAL RESULT CALLED TO, READ BACK BY AND VERIFIED WITH: J [place] PHARMD 09/25/18 1751 JDW    Streptococcus species NOT DETECTED NOT DETECTED Final   Streptococcus agalactiae NOT DETECTED NOT DETECTED Final   Streptococcus pneumoniae NOT DETECTED NOT DETECTED Final   Streptococcus pyogenes NOT DETECTED NOT DETECTED Final   Acinetobacter baumannii NOT DETECTED NOT DETECTED Final   Enterobacteriaceae species NOT DETECTED NOT DETECTED Final   Enterobacter cloacae complex NOT DETECTED NOT DETECTED Final   Escherichia coli NOT DETECTED NOT DETECTED Final   Klebsiella oxytoca NOT DETECTED NOT DETECTED Final   Klebsiella pneumoniae NOT DETECTED NOT DETECTED Final   Proteus species NOT DETECTED NOT DETECTED Final   Serratia marcescens NOT DETECTED NOT DETECTED Final   Haemophilus influenzae NOT DETECTED NOT DETECTED Final   Neisseria meningitidis NOT DETECTED NOT DETECTED Final   Pseudomonas aeruginosa NOT DETECTED NOT DETECTED Final   Candida albicans NOT DETECTED NOT DETECTED  Final   Candida glabrata NOT DETECTED NOT DETECTED Final   Candida krusei NOT DETECTED NOT DETECTED Final   Candida parapsilosis NOT DETECTED NOT DETECTED Final   Candida tropicalis NOT DETECTED NOT DETECTED Final    Comment: Performed at [facility], [address] [place], [place] [address]  Culture, blood (routine x 2)     Status: None   Collection Time: 09/24/18  3:59 PM  Result Value Ref Range Status   Specimen Description BLOOD HAND  Final   Special Requests   Final    BOTTLES DRAWN AEROBIC ONLY Blood Culture adequate volume   Culture   Final    NO GROWTH 5 DAYS Performed at [facility], [address] [place], [place] [address]    Report Status 09/29/2018 FINAL  Final  Culture, Urine  Status: Abnormal   Collection Time: 09/24/18  4:22 PM  Result Value Ref Range Status   Specimen Description URINE, RANDOM  Final   Special Requests   Final    NONE Performed at [facility], [address] [place], [place] [address]    Culture >=100,000 COLONIES/mL ENTEROCOCCUS FAECALIS (A)  Final   Report Status 09/26/2018 FINAL  Final   Organism ID, Bacteria ENTEROCOCCUS FAECALIS (A)  Final      Susceptibility   Enterococcus faecalis - MIC*    AMPICILLIN <=2 SENSITIVE Sensitive     LEVOFLOXACIN >=8 RESISTANT Resistant     NITROFURANTOIN <=16 SENSITIVE Sensitive     VANCOMYCIN 1 SENSITIVE Sensitive     * >=100,000 COLONIES/mL ENTEROCOCCUS FAECALIS  Culture, Urine     Status: None   Collection Time: 09/28/18  6:53 PM  Result Value Ref Range Status   Specimen Description URINE, CATHETERIZED  Final   Special Requests Immunocompromised  Final   Culture   Final    NO GROWTH Performed at [facility], [address] [place], [place] [address]    Report Status 09/29/2018 FINAL  Final  Culture, Urine     Status: None   Collection Time: 10/06/18  8:59 AM  Result Value Ref Range Status   Specimen Description URINE, RANDOM  Final   Special Requests NONE   Final   Culture   Final    NO GROWTH Performed at [facility], [address] [place], [place] [address]    Report Status 10/07/2018 FINAL  Final  Culture, blood (routine x 2)     Status: None   Collection Time: 10/06/18  9:55 AM  Result Value Ref Range Status   Specimen Description BLOOD RIGHT FOREARM  Final   Special Requests   Final    AEROBIC BOTTLE ONLY Blood Culture results may not be optimal due to an excessive volume of blood received in culture bottles   Culture   Final    NO GROWTH 5 DAYS Performed at [facility], [address] [place], [place] [address]    Report Status 10/11/2018 FINAL  Final  Culture, blood (routine x 2)     Status: None   Collection Time: 10/06/18 10:00 AM  Result Value Ref Range Status   Specimen Description BLOOD RIGHT ANTECUBITAL  Final   Special Requests   Final    AEROBIC BOTTLE ONLY Blood Culture results may not be optimal due to an excessive volume of blood received in culture bottles   Culture   Final    NO GROWTH 5 DAYS Performed at [facility], [address] [place], [place] [address]    Report Status 10/11/2018 FINAL  Final    Anti-infectives:  Anti-infectives (From admission, onward)   Start     Dose/Rate Route Frequency Ordered Stop   10/03/18 0000  ceFAZolin (ANCEF) IVPB 2g/100 mL premix     2 g 200 mL/hr over 30 Minutes Intravenous To Surgery 10/02/18 1822 10/03/18 1348   09/27/18 0830  ampicillin (OMNIPEN) 2 g in sodium chloride 0.9 % 100 mL IVPB  Status:  Discontinued     2 g 300 mL/hr over 20 Minutes Intravenous Every 6 hours 09/27/18 0822 10/05/18 0900   09/25/18 2000  vancomycin (VANCOCIN) 1,250 mg in sodium chloride 0.9 % 250 mL IVPB  Status:  Discontinued     1,250 mg 166.7 mL/hr over 90 Minutes Intravenous Every 12 hours 09/25/18  1854 09/27/18 0822   09/25/18 0800  ceFEPIme (MAXIPIME) 2 g in sodium chloride 0.9 % 100 mL IVPB  Status:  Discontinued     2 g 200 mL/hr over 30 Minutes Intravenous  Every 8 hours 09/25/18 0750 09/27/18 0822   09/19/18 1257  polymyxin B 500,000 Units, bacitracin 50,000 Units in sodium chloride 0.9 % 500 mL irrigation  Status:  Discontinued       As needed 09/19/18 1257 09/19/18 1608   09/19/18 1100  ceFAZolin (ANCEF) IVPB 2g/100 mL premix     2 g 200 mL/hr over 30 Minutes Intravenous To Surgery 09/18/18 2055 09/19/18 1334   08/29/18 1512  polymyxin B 500,000 Units, bacitracin 50,000 Units in sodium chloride 0.9 % 500 mL irrigation  Status:  Discontinued       As needed 08/29/18 1512 08/29/18 1721   08/23/18 1200  meropenem (MERREM) 1 g in sodium chloride 0.9 % 100 mL IVPB     1 g 200 mL/hr over 30 Minutes Intravenous Every 8 hours 08/23/18 1122 09/05/18 1311   08/22/18 1400  ceFEPIme (MAXIPIME) 2 g in sodium chloride 0.9 % 100 mL IVPB  Status:  Discontinued     2 g 200 mL/hr over 30 Minutes Intravenous Every 8 hours 08/22/18 0836 08/22/18 1027   08/22/18 1200  meropenem (MERREM) 2 g in sodium chloride 0.9 % 100 mL IVPB  Status:  Discontinued     2 g 200 mL/hr over 30 Minutes Intravenous Every 8 hours 08/22/18 1027 08/23/18 1122   08/21/18 1400  ceFEPIme (MAXIPIME) 1 g in sodium chloride 0.9 % 100 mL IVPB  Status:  Discontinued     1 g 200 mL/hr over 30 Minutes Intravenous Every 8 hours 08/21/18 1042 08/22/18 0836   08/19/18 1030  cefTRIAXone (ROCEPHIN) 2 g in sodium chloride 0.9 % 100 mL IVPB  Status:  Discontinued     2 g 200 mL/hr over 30 Minutes Intravenous Every 24 hours 08/19/18 1017 08/21/18 1042   08/13/18 1400  piperacillin-tazobactam (ZOSYN) IVPB 3.375 g  Status:  Discontinued     3.375 g 12.5 mL/hr over 240 Minutes Intravenous Every 8 hours 08/13/18 0928 08/16/18 0854   08/13/18 1354  polymyxin B 500,000 Units, bacitracin 50,000 Units in sodium chloride 0.9 % 500 mL irrigation  Status:  Discontinued       As needed 08/13/18 1354 08/13/18 1538   08/13/18 0930  piperacillin-tazobactam (ZOSYN) IVPB 3.375 g     3.375 g 100 mL/hr over 30  Minutes Intravenous  Once 08/13/18 0928 08/13/18 1200   08/06/18 0801  polymyxin B 500,000 Units, bacitracin 50,000 Units in sodium chloride 0.9 % 500 mL irrigation  Status:  Discontinued       As needed 08/06/18 0803 08/06/18 0951   08/06/18 0600  ceFAZolin (ANCEF) IVPB 2g/100 mL premix  Status:  Discontinued     2 g 200 mL/hr over 30 Minutes Intravenous On call to O.R. 08/05/18 2241 08/05/18 2241   08/06/18 0600  ceFAZolin (ANCEF) IVPB 2g/100 mL premix  Status:  Discontinued     2 g 200 mL/hr over 30 Minutes Intravenous To Short Stay 08/05/18 2241 08/06/18 1024   08/01/18 2200  Ampicillin-Sulbactam (UNASYN) 3 g in sodium chloride 0.9 % 100 mL IVPB     3 g 200 mL/hr over 30 Minutes Intravenous Every 6 hours 08/01/18 2146 08/08/18 2235   08/01/18 1630  piperacillin-tazobactam (ZOSYN) IVPB 3.375 g  Status:  Discontinued     3.375 g  12.5 mL/hr over 240 Minutes Intravenous Every 8 hours 08/01/18 1624 08/01/18 2139   07/25/18 1508  polymyxin B 500,000 Units, bacitracin 50,000 Units in sodium chloride 0.9 % 500 mL irrigation  Status:  Discontinued       As needed 07/25/18 1509 07/25/18 1750   07/25/18 1445  piperacillin-tazobactam (ZOSYN) IVPB 3.375 g     3.375 g 100 mL/hr over 30 Minutes Intravenous STAT 07/25/18 1443 07/25/18 1620   07/25/18 0600  ceFAZolin (ANCEF) 3 g in dextrose 5 % 50 mL IVPB  Status:  Discontinued     3 g 100 mL/hr over 30 Minutes Intravenous To ShortStay Surgical 07/24/18 1735 07/25/18 1753   07/18/18 1444  polymyxin B 500,000 Units, bacitracin 50,000 Units in sodium chloride 0.9 % 500 mL irrigation  Status:  Discontinued       As needed 07/18/18 1445 07/18/18 1738   07/17/18 0900  piperacillin-tazobactam (ZOSYN) IVPB 3.375 g  Status:  Discontinued     3.375 g 12.5 mL/hr over 240 Minutes Intravenous Every 8 hours 07/17/18 0810 07/27/18 0806   07/12/18 1430  metronidazole (FLAGYL) IVPB 500 mg  Status:  Discontinued     500 mg 100 mL/hr over 60 Minutes Intravenous To  Surgery 07/12/18 1427 07/12/18 1608   07/12/18 1430  cefTRIAXone (ROCEPHIN) 2 g in sodium chloride 0.9 % 100 mL IVPB  Status:  Discontinued     2 g 200 mL/hr over 30 Minutes Intravenous To Surgery 07/12/18 1427 07/12/18 1608   07/12/18 1245  tobramycin (NEBCIN) powder  Status:  Discontinued       As needed 07/12/18 1245 07/12/18 1542   07/12/18 1243  vancomycin (VANCOCIN) powder  Status:  Discontinued       As needed 07/12/18 1244 07/12/18 1542   07/11/18 1330  cefTRIAXone (ROCEPHIN) 2 g in sodium chloride 0.9 % 100 mL IVPB  Status:  Discontinued     2 g 200 mL/hr over 30 Minutes Intravenous Every 24 hours 07/11/18 1259 07/17/18 0810   07/11/18 1300  metroNIDAZOLE (FLAGYL) IVPB 500 mg  Status:  Discontinued     500 mg 100 mL/hr over 60 Minutes Intravenous Every 8 hours 07/11/18 1259 07/17/18 0810   07/11/18 0400  ceFAZolin (ANCEF) IVPB 2g/100 mL premix  Status:  Discontinued     2 g 200 mL/hr over 30 Minutes Intravenous Every 8 hours 07/10/18 2109 07/11/18 1259   07/10/18 1815  ceFAZolin (ANCEF) IVPB 2g/100 mL premix  Status:  Discontinued     2 g 200 mL/hr over 30 Minutes Intravenous  Once 07/10/18 1814 07/10/18 2339      Best Practice/Protocols:  VTE Prophylaxis: Lovenox (prophylaxtic dose) and Mechanical   Consults: Treatment Team:  Md, Trauma, MD Haddix, Gillie Manners, MD Dillingham, Alena Bills, DO    Events:  Chief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

## 2018-10-14 LAB — GLUCOSE, CAPILLARY
Glucose-Capillary: 126 mg/dL — ABNORMAL HIGH (ref 70–99)
Glucose-Capillary: 106 mg/dL — ABNORMAL HIGH (ref 70–99)
Glucose-Capillary: 122 mg/dL — ABNORMAL HIGH (ref 70–99)
Glucose-Capillary: 143 mg/dL — ABNORMAL HIGH (ref 70–99)
Glucose-Capillary: 123 mg/dL — ABNORMAL HIGH (ref 70–99)
Glucose-Capillary: 140 mg/dL — ABNORMAL HIGH (ref 70–99)

## 2018-10-14 NOTE — Progress Notes (Signed)
Follow up - Trauma and Critical Care  Patient Details:    Christopher [name] is an 53 y.o. male.  Lines/tubes : PICC Double Lumen 08/10/18 PICC Left Brachial 50 cm 1 cm (Active)  Indication for Insertion or Continuance of Line Prolonged intravenous therapies 10/12/2018  7:35 AM  Exposed Catheter (cm) 1 cm 08/10/2018  3:39 PM  Site Assessment Clean;Dry;Intact 10/11/2018  8:00 PM  Lumen #1 Status Infusing 10/11/2018  8:00 PM  Lumen #2 Status Flushed;In-line blood sampling system in place;Blood return noted 10/11/2018  8:00 PM  Dressing Type Transparent;Occlusive 10/11/2018  8:00 PM  Dressing Status Clean;Dry;Intact;Antimicrobial disc in place 10/11/2018  8:00 PM  Line Care Connections checked and tightened 10/11/2018  8:00 PM  Line Adjustment (NICU/IV Team Only) No 10/03/2018  8:00 PM  Dressing Intervention New dressing;Antimicrobial disc changed;Securement device changed;Dressing changed 10/05/2018  4:00 PM  Dressing Change Due 10/12/18 10/11/2018  8:00 PM     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)  Surrounding Skin Dry;Intact 10/11/2018  8:00 PM  Tube Status Patent 10/12/2018  4:00 AM  Drainage Appearance None 10/11/2018  4:30 PM  Catheter Position (cm marking) 5 cm 10/12/2018  4:00 AM  Dressing Status Clean;Dry;Intact 10/11/2018  8:00 PM  Dressing Intervention Dressing changed 10/07/2018  8:00 AM  Dressing Type Split gauze 10/11/2018  8:00 PM  Dressing Change Due 09/16/18 09/27/2018  8:00 PM  G Port Intake (mL) 120 ml 10/11/2018  6:00 PM  J Port Intake (mL) 200 ml 10/07/2018  1:38 PM  Output (mL) 75 mL 09/06/2018  6:00 PM     Colostomy LLQ (Active)  Ostomy Pouch 2 piece;Intact 10/11/2018  8:00 PM  Stoma Assessment Pink 10/11/2018  8:00 PM  Peristomal Assessment Intact 10/11/2018  8:00 PM  Treatment Pouch change 10/06/2018  4:00 AM  Output (mL) 150 mL 10/12/2018  4:00 AM     Suprapubic Catheter Non-latex 14 Fr. (Active)  Site Assessment Intact;Clean  10/11/2018  8:00 PM  Dressing Status Clean;Dry;Intact 10/11/2018  8:00 PM  Dressing Type Split gauze 10/11/2018  8:00 PM  Collection Container Standard drainage bag 10/11/2018  8:00 PM  Securement Method Securement Device 10/08/2018  8:00 PM  Indication for Insertion or Continuance of Catheter Bladder outlet obstruction / other urologic reason 10/11/2018  8:00 PM  Output (mL) 125 mL 10/12/2018  7:00 AM    Microbiology/Sepsis markers: Results for orders placed or performed during the hospital encounter of 07/10/18  MRSA PCR Screening     Status: None   Collection Time: 07/11/18  1:47 AM  Result Value Ref Range Status   MRSA by PCR NEGATIVE NEGATIVE Final    Comment:        The GeneXpert MRSA Assay (FDA approved for NASAL specimens only), is one component of a comprehensive MRSA colonization surveillance program. It is not intended to diagnose MRSA infection nor to guide or monitor treatment for MRSA infections. Performed at [facility], [address], [place], [place] [address]   Surgical pcr screen     Status: None   Collection Time: 07/12/18  8:11 AM  Result Value Ref Range Status   MRSA, PCR NEGATIVE NEGATIVE Final   Staphylococcus aureus NEGATIVE NEGATIVE Final    Comment: (NOTE) The Xpert SA Assay (FDA approved for NASAL specimens in patients 67 years of age and older), is one component of a comprehensive surveillance program. It is not intended to diagnose infection nor to guide or monitor treatment. Performed at [facility], [address], [place], [place] [address]   Culture, blood (Routine X 2) w Reflex to ID Panel     Status: None   Collection Time: 07/17/18  8:40 AM  Result Value Ref Range Status   Specimen Description BLOOD RIGHT ARM  Final   Special Requests   Final    BOTTLES DRAWN AEROBIC AND ANAEROBIC Blood Culture adequate volume   Culture   Final    NO GROWTH 5 DAYS Performed at [facility], [address] [place], [place] [address]     Report Status 07/22/2018 FINAL  Final  Culture, blood (Routine X 2) w Reflex to ID Panel     Status: None   Collection Time: 07/17/18  8:52 AM  Result Value Ref Range Status   Specimen Description BLOOD RIGHT HAND  Final   Special Requests   Final    BOTTLES DRAWN AEROBIC ONLY Blood Culture adequate volume   Culture   Final    NO GROWTH 5 DAYS Performed at [facility], [address], [place], [place] [address]    Report Status 07/22/2018 FINAL  Final  Culture, blood (Routine X 2) w Reflex to ID Panel     Status: None   Collection Time: 07/30/18  8:50 AM  Result Value Ref Range Status   Specimen Description BLOOD LEFT HAND  Final   Special Requests   Final    BOTTLES DRAWN AEROBIC ONLY Blood Culture results may not be optimal due to an inadequate volume of blood received in culture bottles Performed at [facility], [address], [place], [place] [address]    Culture NO GROWTH 5 DAYS  Final   Report Status 08/04/2018 FINAL  Final  Culture, blood (Routine X 2) w Reflex to ID Panel     Status: None   Collection Time: 07/30/18  9:56 AM  Result Value Ref Range Status   Specimen Description BLOOD LEFT ARM  Final   Special Requests   Final    BOTTLES DRAWN AEROBIC ONLY Blood Culture results may not be optimal due to an inadequate volume of blood received in culture bottles Performed at [facility], [address], [place], [place] [address]    Culture NO GROWTH 5 DAYS  Final   Report Status 08/04/2018 FINAL  Final  Culture, respiratory (non-expectorated)     Status: None   Collection Time: 07/30/18 11:38 AM  Result Value Ref Range Status   Specimen Description TRACHEAL ASPIRATE  Final   Special Requests Normal  Final   Gram Stain   Final    RARE WBC PRESENT, PREDOMINANTLY PMN RARE GRAM POSITIVE COCCI Performed at [facility], [address] [place], [place] [address]    Culture   Final    MODERATE ACINETOBACTER CALCOACETICUS/BAUMANNII COMPLEX    Report Status 08/01/2018 FINAL  Final   Organism ID, Bacteria ACINETOBACTER CALCOACETICUS/BAUMANNII COMPLEX  Final      Susceptibility   Acinetobacter calcoaceticus/baumannii complex - MIC*    CEFTAZIDIME 8 SENSITIVE Sensitive     CEFTRIAXONE 32 INTERMEDIATE Intermediate     CIPROFLOXACIN <=0.25 SENSITIVE Sensitive     GENTAMICIN <=1 SENSITIVE Sensitive     IMIPENEM <=0.25 SENSITIVE Sensitive     PIP/TAZO <=4 SENSITIVE Sensitive     TRIMETH/SULFA <=20 SENSITIVE Sensitive     CEFEPIME 4 SENSITIVE Sensitive     AMPICILLIN/SULBACTAM <=2 SENSITIVE Sensitive     * MODERATE ACINETOBACTER CALCOACETICUS/BAUMANNII COMPLEX  Culture, respiratory (non-expectorated)     Status: None   Collection Time: 08/13/18  9:22 AM  Result Value Ref Range Status   Specimen Description TRACHEAL ASPIRATE  Final   Special Requests Normal  Final   Gram Stain   Final    FEW WBC PRESENT, PREDOMINANTLY PMN MODERATE GRAM POSITIVE COCCI MODERATE GRAM NEGATIVE RODS    Culture   Final    Consistent with normal respiratory flora. Performed at [facility], [address] [place], [place] [address]    Report Status 08/15/2018 FINAL  Final  Culture, blood (Routine X 2) w Reflex to ID Panel     Status: None   Collection Time: 08/13/18  4:23 PM  Result Value Ref Range Status   Specimen Description BLOOD FOOT  Final   Special Requests   Final    BOTTLES DRAWN AEROBIC ONLY Blood Culture results may not be optimal due to an inadequate volume of blood received in culture bottles   Culture   Final    NO GROWTH 5 DAYS Performed at [facility], [address], [place], [place] [address]    Report Status 08/18/2018 FINAL  Final  Culture, blood (Routine X 2) w Reflex to ID Panel     Status: None   Collection Time: 08/13/18  4:41 PM  Result Value Ref Range Status   Specimen Description BLOOD FOOT  Final   Special Requests   Final    BOTTLES DRAWN AEROBIC ONLY Blood Culture results may not be optimal due to an  inadequate volume of blood received in culture bottles   Culture   Final    NO GROWTH 5 DAYS Performed at [facility], [address], [place], [place] [address]    Report Status 08/18/2018 FINAL  Final  Surgical pcr screen     Status: None   Collection Time: 08/16/18  4:03 AM  Result Value Ref Range Status   MRSA, PCR NEGATIVE NEGATIVE Final   Staphylococcus aureus NEGATIVE NEGATIVE Final    Comment: (NOTE) The Xpert SA Assay (FDA approved for NASAL specimens in patients 67 years of age and older), is one component of a comprehensive surveillance program. It is not intended to diagnose infection nor to guide or monitor treatment. Performed at [facility], [address], [place], [place] [address]   Culture, blood (routine x 2)     Status: Abnormal   Collection Time: 08/18/18 11:50 AM  Result Value Ref Range Status   Specimen Description BLOOD RIGHT HAND  Final   Special Requests   Final    BOTTLES DRAWN AEROBIC ONLY Blood Culture adequate volume   Culture  Setup Time   Final    GRAM NEGATIVE RODS AEROBIC BOTTLE ONLY CRITICAL RESULT CALLED TO, READ BACK BY AND VERIFIED WITH: V. BRYK,PHARMD [id number] 08/19/2018 T. TYSOR    Culture (A)  Final    SERRATIA MARCESCENS PSEUDOMONAS PUTIDA CRITICAL RESULT CALLED TO, READ BACK BY AND VERIFIED WITH: PHARMD M LORI [id number] AT 757 AM BY CM Performed at [facility], [address] [place], [place] [address]    Report Status 08/22/2018 FINAL  Final   Organism ID, Bacteria SERRATIA MARCESCENS  Final   Organism ID, Bacteria PSEUDOMONAS PUTIDA  Final      Susceptibility   Pseudomonas putida - MIC*    CEFTAZIDIME 16 INTERMEDIATE Intermediate     CIPROFLOXACIN 0.5 SENSITIVE Sensitive     GENTAMICIN <=1 SENSITIVE Sensitive  IMIPENEM 2 SENSITIVE Sensitive     PIP/TAZO >=128 RESISTANT Resistant     CEFEPIME 8 SENSITIVE Sensitive     * PSEUDOMONAS PUTIDA   Serratia marcescens - MIC*    CEFAZOLIN >=64 RESISTANT Resistant      CEFEPIME <=1 SENSITIVE Sensitive     CEFTAZIDIME <=1 SENSITIVE Sensitive     CEFTRIAXONE <=1 SENSITIVE Sensitive     CIPROFLOXACIN <=0.25 SENSITIVE Sensitive     GENTAMICIN <=1 SENSITIVE Sensitive     TRIMETH/SULFA <=20 SENSITIVE Sensitive     * SERRATIA MARCESCENS  Blood Culture ID Panel (Reflexed)     Status: Abnormal   Collection Time: 08/18/18 11:50 AM  Result Value Ref Range Status   Enterococcus species NOT DETECTED NOT DETECTED Final   Listeria monocytogenes NOT DETECTED NOT DETECTED Final   Staphylococcus species NOT DETECTED NOT DETECTED Final   Staphylococcus aureus (BCID) NOT DETECTED NOT DETECTED Final   Streptococcus species NOT DETECTED NOT DETECTED Final   Streptococcus agalactiae NOT DETECTED NOT DETECTED Final   Streptococcus pneumoniae NOT DETECTED NOT DETECTED Final   Streptococcus pyogenes NOT DETECTED NOT DETECTED Final   Acinetobacter baumannii NOT DETECTED NOT DETECTED Final   Enterobacteriaceae species DETECTED (A) NOT DETECTED Final    Comment: Enterobacteriaceae represent a large family of gram-negative bacteria, not a single organism. CRITICAL RESULT CALLED TO, READ BACK BY AND VERIFIED WITH: V. Joan Mayans 1610 08/19/2018 T. TYSOR    Enterobacter cloacae complex NOT DETECTED NOT DETECTED Final   Escherichia coli NOT DETECTED NOT DETECTED Final   Klebsiella oxytoca NOT DETECTED NOT DETECTED Final   Klebsiella pneumoniae NOT DETECTED NOT DETECTED Final   Proteus species NOT DETECTED NOT DETECTED Final   Serratia marcescens DETECTED (A) NOT DETECTED Final    Comment: CRITICAL RESULT CALLED TO, READ BACK BY AND VERIFIED WITH: VJoan Mayans [id number] 08/19/2018 T. TYSOR    Carbapenem resistance NOT DETECTED NOT DETECTED Final   Haemophilus influenzae NOT DETECTED NOT DETECTED Final   Neisseria meningitidis NOT DETECTED NOT DETECTED Final   Pseudomonas aeruginosa NOT DETECTED NOT DETECTED Final   Candida albicans NOT DETECTED NOT DETECTED Final   Candida  glabrata NOT DETECTED NOT DETECTED Final   Candida krusei NOT DETECTED NOT DETECTED Final   Candida parapsilosis NOT DETECTED NOT DETECTED Final   Candida tropicalis NOT DETECTED NOT DETECTED Final    Comment: Performed at [facility], [address] [place], [place] [address]  Culture, blood (routine x 2)     Status: None   Collection Time: 08/18/18 11:58 AM  Result Value Ref Range Status   Specimen Description BLOOD RIGHT ANTECUBITAL  Final   Special Requests   Final    BOTTLES DRAWN AEROBIC ONLY Blood Culture adequate volume   Culture   Final    NO GROWTH 5 DAYS Performed at [facility], [address], [place], [place] [address]    Report Status 08/23/2018 FINAL  Final  MRSA PCR Screening     Status: None   Collection Time: 09/21/18  1:30 PM  Result Value Ref Range Status   MRSA by PCR NEGATIVE NEGATIVE Final    Comment:        The GeneXpert MRSA Assay (FDA approved for NASAL specimens only), is one component of a comprehensive MRSA colonization surveillance program. It is not intended to diagnose MRSA infection nor to guide or monitor treatment for MRSA infections. Performed at [facility], [address] [place], [place] [address]  Culture, blood (routine x 2)     Status: Abnormal   Collection Time: 09/24/18  3:55 PM  Result Value Ref Range Status   Specimen Description BLOOD RIGHT ANTECUBITAL  Final   Special Requests   Final    BOTTLES DRAWN AEROBIC ONLY Blood Culture adequate volume   Culture  Setup Time   Final    GRAM POSITIVE COCCI IN CLUSTERS AEROBIC BOTTLE ONLY CRITICAL RESULT CALLED TO, READ BACK BY AND VERIFIED WITH: J [place] PHARMD 09/25/18 1751 JDW    Culture (A)  Final    STAPHYLOCOCCUS SPECIES (COAGULASE NEGATIVE) THE SIGNIFICANCE OF ISOLATING THIS ORGANISM FROM A SINGLE SET OF BLOOD CULTURES WHEN MULTIPLE SETS ARE DRAWN IS UNCERTAIN. PLEASE NOTIFY THE MICROBIOLOGY DEPARTMENT WITHIN ONE WEEK IF SPECIATION AND SENSITIVITIES ARE  REQUIRED. Performed at [facility], [address] [place], [place] [address]    Report Status 09/26/2018 FINAL  Final  Blood Culture ID Panel (Reflexed)     Status: Abnormal   Collection Time: 09/24/18  3:55 PM  Result Value Ref Range Status   Enterococcus species NOT DETECTED NOT DETECTED Final   Listeria monocytogenes NOT DETECTED NOT DETECTED Final   Staphylococcus species DETECTED (A) NOT DETECTED Final    Comment: Methicillin (oxacillin) resistant coagulase negative staphylococcus. Possible blood culture contaminant (unless isolated from more than one blood culture draw or clinical case suggests pathogenicity). No antibiotic treatment is indicated for blood  culture contaminants. CRITICAL RESULT CALLED TO, READ BACK BY AND VERIFIED WITH: J [place] PHARMD 09/25/18 1751 JDW    Staphylococcus aureus (BCID) NOT DETECTED NOT DETECTED Final   Methicillin resistance DETECTED (A) NOT DETECTED Final    Comment: CRITICAL RESULT CALLED TO, READ BACK BY AND VERIFIED WITH: J [place] PHARMD 09/25/18 1751 JDW    Streptococcus species NOT DETECTED NOT DETECTED Final   Streptococcus agalactiae NOT DETECTED NOT DETECTED Final   Streptococcus pneumoniae NOT DETECTED NOT DETECTED Final   Streptococcus pyogenes NOT DETECTED NOT DETECTED Final   Acinetobacter baumannii NOT DETECTED NOT DETECTED Final   Enterobacteriaceae species NOT DETECTED NOT DETECTED Final   Enterobacter cloacae complex NOT DETECTED NOT DETECTED Final   Escherichia coli NOT DETECTED NOT DETECTED Final   Klebsiella oxytoca NOT DETECTED NOT DETECTED Final   Klebsiella pneumoniae NOT DETECTED NOT DETECTED Final   Proteus species NOT DETECTED NOT DETECTED Final   Serratia marcescens NOT DETECTED NOT DETECTED Final   Haemophilus influenzae NOT DETECTED NOT DETECTED Final   Neisseria meningitidis NOT DETECTED NOT DETECTED Final   Pseudomonas aeruginosa NOT DETECTED NOT DETECTED Final   Candida albicans NOT DETECTED NOT DETECTED  Final   Candida glabrata NOT DETECTED NOT DETECTED Final   Candida krusei NOT DETECTED NOT DETECTED Final   Candida parapsilosis NOT DETECTED NOT DETECTED Final   Candida tropicalis NOT DETECTED NOT DETECTED Final    Comment: Performed at [facility], [address] [place], [place] [address]  Culture, blood (routine x 2)     Status: None   Collection Time: 09/24/18  3:59 PM  Result Value Ref Range Status   Specimen Description BLOOD HAND  Final   Special Requests   Final    BOTTLES DRAWN AEROBIC ONLY Blood Culture adequate volume   Culture   Final    NO GROWTH 5 DAYS Performed at [facility], [address] [place], [place] [address]    Report Status 09/29/2018 FINAL  Final  Culture, Urine  Status: Abnormal   Collection Time: 09/24/18  4:22 PM  Result Value Ref Range Status   Specimen Description URINE, RANDOM  Final   Special Requests   Final    NONE Performed at [facility], [address] [place], [place] [address]    Culture >=100,000 COLONIES/mL ENTEROCOCCUS FAECALIS (A)  Final   Report Status 09/26/2018 FINAL  Final   Organism ID, Bacteria ENTEROCOCCUS FAECALIS (A)  Final      Susceptibility   Enterococcus faecalis - MIC*    AMPICILLIN <=2 SENSITIVE Sensitive     LEVOFLOXACIN >=8 RESISTANT Resistant     NITROFURANTOIN <=16 SENSITIVE Sensitive     VANCOMYCIN 1 SENSITIVE Sensitive     * >=100,000 COLONIES/mL ENTEROCOCCUS FAECALIS  Culture, Urine     Status: None   Collection Time: 09/28/18  6:53 PM  Result Value Ref Range Status   Specimen Description URINE, CATHETERIZED  Final   Special Requests Immunocompromised  Final   Culture   Final    NO GROWTH Performed at [facility], [address] [place], [place] [address]    Report Status 09/29/2018 FINAL  Final  Culture, Urine     Status: None   Collection Time: 10/06/18  8:59 AM  Result Value Ref Range Status   Specimen Description URINE, RANDOM  Final   Special Requests NONE   Final   Culture   Final    NO GROWTH Performed at [facility], [address] [place], [place] [address]    Report Status 10/07/2018 FINAL  Final  Culture, blood (routine x 2)     Status: None   Collection Time: 10/06/18  9:55 AM  Result Value Ref Range Status   Specimen Description BLOOD RIGHT FOREARM  Final   Special Requests   Final    AEROBIC BOTTLE ONLY Blood Culture results may not be optimal due to an excessive volume of blood received in culture bottles   Culture   Final    NO GROWTH 5 DAYS Performed at [facility], [address] [place], [place] [address]    Report Status 10/11/2018 FINAL  Final  Culture, blood (routine x 2)     Status: None   Collection Time: 10/06/18 10:00 AM  Result Value Ref Range Status   Specimen Description BLOOD RIGHT ANTECUBITAL  Final   Special Requests   Final    AEROBIC BOTTLE ONLY Blood Culture results may not be optimal due to an excessive volume of blood received in culture bottles   Culture   Final    NO GROWTH 5 DAYS Performed at [facility], [address] [place], [place] [address]    Report Status 10/11/2018 FINAL  Final    Anti-infectives:  Anti-infectives (From admission, onward)   Start     Dose/Rate Route Frequency Ordered Stop   10/03/18 0000  ceFAZolin (ANCEF) IVPB 2g/100 mL premix     2 g 200 mL/hr over 30 Minutes Intravenous To Surgery 10/02/18 1822 10/03/18 1348   09/27/18 0830  ampicillin (OMNIPEN) 2 g in sodium chloride 0.9 % 100 mL IVPB  Status:  Discontinued     2 g 300 mL/hr over 20 Minutes Intravenous Every 6 hours 09/27/18 0822 10/05/18 0900   09/25/18 2000  vancomycin (VANCOCIN) 1,250 mg in sodium chloride 0.9 % 250 mL IVPB  Status:  Discontinued     1,250 mg 166.7 mL/hr over 90 Minutes Intravenous Every 12 hours 09/25/18  1854 09/27/18 0822   09/25/18 0800  ceFEPIme (MAXIPIME) 2 g in sodium chloride 0.9 % 100 mL IVPB  Status:  Discontinued     2 g 200 mL/hr over 30 Minutes Intravenous  Every 8 hours 09/25/18 0750 09/27/18 0822   09/19/18 1257  polymyxin B 500,000 Units, bacitracin 50,000 Units in sodium chloride 0.9 % 500 mL irrigation  Status:  Discontinued       As needed 09/19/18 1257 09/19/18 1608   09/19/18 1100  ceFAZolin (ANCEF) IVPB 2g/100 mL premix     2 g 200 mL/hr over 30 Minutes Intravenous To Surgery 09/18/18 2055 09/19/18 1334   08/29/18 1512  polymyxin B 500,000 Units, bacitracin 50,000 Units in sodium chloride 0.9 % 500 mL irrigation  Status:  Discontinued       As needed 08/29/18 1512 08/29/18 1721   08/23/18 1200  meropenem (MERREM) 1 g in sodium chloride 0.9 % 100 mL IVPB     1 g 200 mL/hr over 30 Minutes Intravenous Every 8 hours 08/23/18 1122 09/05/18 1311   08/22/18 1400  ceFEPIme (MAXIPIME) 2 g in sodium chloride 0.9 % 100 mL IVPB  Status:  Discontinued     2 g 200 mL/hr over 30 Minutes Intravenous Every 8 hours 08/22/18 0836 08/22/18 1027   08/22/18 1200  meropenem (MERREM) 2 g in sodium chloride 0.9 % 100 mL IVPB  Status:  Discontinued     2 g 200 mL/hr over 30 Minutes Intravenous Every 8 hours 08/22/18 1027 08/23/18 1122   08/21/18 1400  ceFEPIme (MAXIPIME) 1 g in sodium chloride 0.9 % 100 mL IVPB  Status:  Discontinued     1 g 200 mL/hr over 30 Minutes Intravenous Every 8 hours 08/21/18 1042 08/22/18 0836   08/19/18 1030  cefTRIAXone (ROCEPHIN) 2 g in sodium chloride 0.9 % 100 mL IVPB  Status:  Discontinued     2 g 200 mL/hr over 30 Minutes Intravenous Every 24 hours 08/19/18 1017 08/21/18 1042   08/13/18 1400  piperacillin-tazobactam (ZOSYN) IVPB 3.375 g  Status:  Discontinued     3.375 g 12.5 mL/hr over 240 Minutes Intravenous Every 8 hours 08/13/18 0928 08/16/18 0854   08/13/18 1354  polymyxin B 500,000 Units, bacitracin 50,000 Units in sodium chloride 0.9 % 500 mL irrigation  Status:  Discontinued       As needed 08/13/18 1354 08/13/18 1538   08/13/18 0930  piperacillin-tazobactam (ZOSYN) IVPB 3.375 g     3.375 g 100 mL/hr over 30  Minutes Intravenous  Once 08/13/18 0928 08/13/18 1200   08/06/18 0801  polymyxin B 500,000 Units, bacitracin 50,000 Units in sodium chloride 0.9 % 500 mL irrigation  Status:  Discontinued       As needed 08/06/18 0803 08/06/18 0951   08/06/18 0600  ceFAZolin (ANCEF) IVPB 2g/100 mL premix  Status:  Discontinued     2 g 200 mL/hr over 30 Minutes Intravenous On call to O.R. 08/05/18 2241 08/05/18 2241   08/06/18 0600  ceFAZolin (ANCEF) IVPB 2g/100 mL premix  Status:  Discontinued     2 g 200 mL/hr over 30 Minutes Intravenous To Short Stay 08/05/18 2241 08/06/18 1024   08/01/18 2200  Ampicillin-Sulbactam (UNASYN) 3 g in sodium chloride 0.9 % 100 mL IVPB     3 g 200 mL/hr over 30 Minutes Intravenous Every 6 hours 08/01/18 2146 08/08/18 2235   08/01/18 1630  piperacillin-tazobactam (ZOSYN) IVPB 3.375 g  Status:  Discontinued     3.375 g  12.5 mL/hr over 240 Minutes Intravenous Every 8 hours 08/01/18 1624 08/01/18 2139   07/25/18 1508  polymyxin B 500,000 Units, bacitracin 50,000 Units in sodium chloride 0.9 % 500 mL irrigation  Status:  Discontinued       As needed 07/25/18 1509 07/25/18 1750   07/25/18 1445  piperacillin-tazobactam (ZOSYN) IVPB 3.375 g     3.375 g 100 mL/hr over 30 Minutes Intravenous STAT 07/25/18 1443 07/25/18 1620   07/25/18 0600  ceFAZolin (ANCEF) 3 g in dextrose 5 % 50 mL IVPB  Status:  Discontinued     3 g 100 mL/hr over 30 Minutes Intravenous To ShortStay Surgical 07/24/18 1735 07/25/18 1753   07/18/18 1444  polymyxin B 500,000 Units, bacitracin 50,000 Units in sodium chloride 0.9 % 500 mL irrigation  Status:  Discontinued       As needed 07/18/18 1445 07/18/18 1738   07/17/18 0900  piperacillin-tazobactam (ZOSYN) IVPB 3.375 g  Status:  Discontinued     3.375 g 12.5 mL/hr over 240 Minutes Intravenous Every 8 hours 07/17/18 0810 07/27/18 0806   07/12/18 1430  metronidazole (FLAGYL) IVPB 500 mg  Status:  Discontinued     500 mg 100 mL/hr over 60 Minutes Intravenous To  Surgery 07/12/18 1427 07/12/18 1608   07/12/18 1430  cefTRIAXone (ROCEPHIN) 2 g in sodium chloride 0.9 % 100 mL IVPB  Status:  Discontinued     2 g 200 mL/hr over 30 Minutes Intravenous To Surgery 07/12/18 1427 07/12/18 1608   07/12/18 1245  tobramycin (NEBCIN) powder  Status:  Discontinued       As needed 07/12/18 1245 07/12/18 1542   07/12/18 1243  vancomycin (VANCOCIN) powder  Status:  Discontinued       As needed 07/12/18 1244 07/12/18 1542   07/11/18 1330  cefTRIAXone (ROCEPHIN) 2 g in sodium chloride 0.9 % 100 mL IVPB  Status:  Discontinued     2 g 200 mL/hr over 30 Minutes Intravenous Every 24 hours 07/11/18 1259 07/17/18 0810   07/11/18 1300  metroNIDAZOLE (FLAGYL) IVPB 500 mg  Status:  Discontinued     500 mg 100 mL/hr over 60 Minutes Intravenous Every 8 hours 07/11/18 1259 07/17/18 0810   07/11/18 0400  ceFAZolin (ANCEF) IVPB 2g/100 mL premix  Status:  Discontinued     2 g 200 mL/hr over 30 Minutes Intravenous Every 8 hours 07/10/18 2109 07/11/18 1259   07/10/18 1815  ceFAZolin (ANCEF) IVPB 2g/100 mL premix  Status:  Discontinued     2 g 200 mL/hr over 30 Minutes Intravenous  Once 07/10/18 1814 07/10/18 2339      Best Practice/Protocols:  VTE Prophylaxis: Lovenox (prophylaxtic dose) and Mechanical   Consults: Treatment Team:  Md, Trauma, MD Haddix, Gillie Manners, MD Dillingham, Alena Bills, DO    Events:  Chief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

## 2018-10-14 NOTE — Progress Notes (Signed)
Assisted tele visit to patient with family.  Geovonni Meyerhoff P, RN  

## 2018-10-15 LAB — GLUCOSE, CAPILLARY
Glucose-Capillary: 104 mg/dL — ABNORMAL HIGH (ref 70–99)
Glucose-Capillary: 140 mg/dL — ABNORMAL HIGH (ref 70–99)
Glucose-Capillary: 120 mg/dL — ABNORMAL HIGH (ref 70–99)
Glucose-Capillary: 112 mg/dL — ABNORMAL HIGH (ref 70–99)
Glucose-Capillary: 133 mg/dL — ABNORMAL HIGH (ref 70–99)
Glucose-Capillary: 140 mg/dL — ABNORMAL HIGH (ref 70–99)

## 2018-10-15 LAB — CBC WITH DIFFERENTIAL/PLATELET
Abs Immature Granulocytes: 0.07 10*3/uL (ref 0.00–0.07)
Basophils Absolute: 0 10*3/uL (ref 0.0–0.1)
Basophils Relative: 0 %
Eosinophils Absolute: 0.6 10*3/uL — ABNORMAL HIGH (ref 0.0–0.5)
Eosinophils Relative: 7 %
HCT: 26.2 % — ABNORMAL LOW (ref 39.0–52.0)
Hemoglobin: 7.7 g/dL — ABNORMAL LOW (ref 13.0–17.0)
Immature Granulocytes: 1 %
Lymphocytes Relative: 11 %
Lymphs Abs: 0.9 10*3/uL (ref 0.7–4.0)
MCH: 32.2 pg (ref 26.0–34.0)
MCHC: 29.4 g/dL — ABNORMAL LOW (ref 30.0–36.0)
MCV: 109.6 fL — ABNORMAL HIGH (ref 80.0–100.0)
Monocytes Absolute: 0.6 10*3/uL (ref 0.1–1.0)
Monocytes Relative: 6 %
Neutro Abs: 6.7 10*3/uL (ref 1.7–7.7)
Neutrophils Relative %: 75 %
Platelets: 405 10*3/uL — ABNORMAL HIGH (ref 150–400)
RBC: 2.39 MIL/uL — ABNORMAL LOW (ref 4.22–5.81)
RDW: 17.4 % — ABNORMAL HIGH (ref 11.5–15.5)
WBC: 8.9 10*3/uL (ref 4.0–10.5)
nRBC: 0 % (ref 0.0–0.2)

## 2018-10-15 LAB — COMPREHENSIVE METABOLIC PANEL
ALT: 68 U/L — ABNORMAL HIGH (ref 0–44)
AST: 38 U/L (ref 15–41)
Albumin: 1.7 g/dL — ABNORMAL LOW (ref 3.5–5.0)
Alkaline Phosphatase: 235 U/L — ABNORMAL HIGH (ref 38–126)
Anion gap: 8 (ref 5–15)
BUN: 21 mg/dL — ABNORMAL HIGH (ref 6–20)
CO2: 26 mmol/L (ref 22–32)
Calcium: 9.8 mg/dL (ref 8.9–10.3)
Chloride: 108 mmol/L (ref 98–111)
Creatinine, Ser: 0.32 mg/dL — ABNORMAL LOW (ref 0.61–1.24)
GFR calc Af Amer: >60 mL/min (ref >60)
GFR calc non Af Amer: >60 mL/min (ref >60)
Glucose, Bld: 129 mg/dL — ABNORMAL HIGH (ref 70–99)
Potassium: 3.5 mmol/L (ref 3.5–5.1)
Sodium: 142 mmol/L (ref 135–145)
Total Bilirubin: 0.4 mg/dL (ref 0.3–1.2)
Total Protein: 6.4 g/dL — ABNORMAL LOW (ref 6.5–8.1)

## 2018-10-15 LAB — PREALBUMIN: Prealbumin: 14.4 mg/dL — ABNORMAL LOW (ref 18–38)

## 2018-10-15 MED ORDER — METOPROLOL TARTRATE 25 MG/10 ML ORAL SUSPENSION
25.0000 mg | Freq: Two times a day (BID) | ORAL | Status: DC
  Administered 2018-10-15 – 2018-10-16 (×3): 25 mg
  Filled 2018-10-15 (×3): qty 10
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
## 2018-10-15 NOTE — Progress Notes (Addendum)
Follow up - Trauma Critical Care  Patient Details:    Christopher [name] is an 53 y.o. male.  Lines/tubes : PICC Double Lumen 08/10/18 PICC Left Brachial 50 cm 1 cm (Active)  Indication for Insertion or Continuance of Line Prolonged intravenous therapies;Poor Vasculature-patient has had multiple peripheral attempts or PIVs lasting less than 24 hours 10/15/2018  8:00 AM  Exposed Catheter (cm) 1 cm 08/10/2018  3:39 PM  Site Assessment Clean;Dry;Intact 10/14/2018  8:00 PM  Lumen #1 Status Infusing 10/14/2018  8:00 PM  Lumen #2 Status Flushed 10/14/2018  8:00 PM  Dressing Type Transparent;Occlusive 10/14/2018  8:00 PM  Dressing Status Dry;Intact;Clean 10/14/2018  8:00 PM  Line Care Connections checked and tightened 10/14/2018  8:00 PM  Line Adjustment (NICU/IV Team Only) No 10/12/2018 12:00 PM  Dressing Intervention Other (Comment) 10/12/2018  8:00 PM  Dressing Change Due 10/19/18 10/14/2018  8:00 PM     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)  Surrounding Skin Dry;Intact 10/14/2018  8:00 PM  Tube Status Patent 10/14/2018  8:00 PM  Drainage Appearance None 10/14/2018  8:00 PM  Catheter Position (cm marking) 5 cm 10/12/2018  4:00 AM  Dressing Status Clean;Dry;Intact 10/14/2018  8:00 PM  Dressing Type Split gauze 10/14/2018  8:00 PM  Dressing Change Due 10/14/18 10/13/2018  8:00 PM  G Port Intake (mL) 70 ml 10/13/2018 12:00 PM  J Port Intake (mL) 200 ml 10/07/2018  1:38 PM  Output (mL) 0 mL 10/13/2018  6:00 AM     Colostomy LLQ (Active)  Ostomy Pouch 2 piece 10/14/2018  8:00 PM  Stoma Assessment Pink 10/14/2018  8:00 PM  Peristomal Assessment Intact 10/14/2018  8:00 PM  Output (mL) 150 mL 10/14/2018 11:18 AM     Suprapubic Catheter Non-latex 14 Fr. 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 It is not intended to diagnose MRSA infection nor to guide or monitor treatment for MRSA infections. Performed at [facility], [address] [place], [place] [address]   Surgical pcr screen     Status: None   Collection Time: 07/12/18  8:11 AM  Result Value Ref [place] Status   MRSA, PCR NEGATIVE NEGATIVE Final   Staphylococcus aureus NEGATIVE NEGATIVE Final    Comment: (NOTE) The Xpert SA Assay (FDA approved for NASAL specimens in patients 59 years of age and older), is one component of a comprehensive surveillance program. It is not intended to diagnose infection nor to guide or monitor treatment. Performed at [facility], [address], [place], [place] [address]   Culture,  blood (Routine X 2) w Reflex to ID Panel     Status: None   Collection Time: 07/17/18  8:40 AM  Result Value Ref [place] Status   Specimen Description BLOOD RIGHT ARM  Final   Special Requests   Final    BOTTLES DRAWN AEROBIC AND ANAEROBIC Blood Culture adequate volume   Culture   Final    NO GROWTH 5 DAYS Performed at [facility], [address], [place], [place] [address]    Report Status 07/22/2018 FINAL  Final  Culture, blood (Routine X 2) w Reflex to ID Panel     Status: None   Collection Time:  07/17/18  8:52 AM  Result Value Ref [place] Status   Specimen Description BLOOD RIGHT HAND  Final   Special Requests   Final    BOTTLES DRAWN AEROBIC ONLY Blood Culture adequate volume   Culture   Final    NO GROWTH 5 DAYS Performed at [facility], [address] [place], [place] [address]    Report Status 07/22/2018 FINAL  Final  Culture, blood (Routine X 2) w Reflex to ID Panel     Status: None   Collection Time: 07/30/18  8:50 AM  Result Value Ref [place] Status   Specimen Description BLOOD LEFT HAND  Final   Special Requests   Final    BOTTLES DRAWN AEROBIC ONLY Blood Culture results may not be optimal due to an inadequate volume of blood received in culture bottles Performed at [facility], [address], [place], [place] [address]    Culture NO GROWTH 5 DAYS  Final   Report Status 08/04/2018 FINAL  Final  Culture, blood (Routine X 2) w Reflex to ID Panel     Status: None   Collection Time: 07/30/18  9:56 AM  Result Value Ref [place] Status   Specimen Description BLOOD LEFT ARM  Final   Special Requests   Final    BOTTLES DRAWN AEROBIC ONLY Blood Culture results may not be optimal due to an inadequate volume of blood received in culture bottles Performed at [facility], [address] [place], [place] [address]    Culture NO GROWTH 5 DAYS  Final   Report Status 08/04/2018 FINAL  Final  Culture, respiratory (non-expectorated)     Status: None   Collection Time: 07/30/18 11:38 AM  Result Value Ref [place] Status   Specimen Description TRACHEAL ASPIRATE  Final   Special Requests Normal  Final   Gram Stain   Final    RARE WBC PRESENT, PREDOMINANTLY PMN RARE GRAM POSITIVE COCCI Performed at [facility], [address] [place], [place] [address]    Culture   Final    MODERATE ACINETOBACTER CALCOACETICUS/BAUMANNII COMPLEX   Report Status 08/01/2018 FINAL  Final   Organism ID, Bacteria ACINETOBACTER CALCOACETICUS/BAUMANNII COMPLEX  Final       Susceptibility   Acinetobacter calcoaceticus/baumannii complex - MIC*    CEFTAZIDIME 8 SENSITIVE Sensitive     CEFTRIAXONE 32 INTERMEDIATE Intermediate     CIPROFLOXACIN <=0.25 SENSITIVE Sensitive     GENTAMICIN <=1 SENSITIVE Sensitive     IMIPENEM <=0.25 SENSITIVE Sensitive     PIP/TAZO <=4 SENSITIVE Sensitive     TRIMETH/SULFA <=20 SENSITIVE Sensitive     CEFEPIME 4 SENSITIVE Sensitive     AMPICILLIN/SULBACTAM <=2 SENSITIVE Sensitive     * MODERATE ACINETOBACTER CALCOACETICUS/BAUMANNII COMPLEX  Culture, respiratory (non-expectorated)     Status: None  Collection Time: 08/13/18  9:22 AM  Result Value Ref [place] Status   Specimen Description TRACHEAL ASPIRATE  Final   Special Requests Normal  Final   Gram Stain   Final    FEW WBC PRESENT, PREDOMINANTLY PMN MODERATE GRAM POSITIVE COCCI MODERATE GRAM NEGATIVE RODS    Culture   Final    Consistent with normal respiratory flora. Performed at [facility], [address] [place], [place] [address]    Report Status 08/15/2018 FINAL  Final  Culture, blood (Routine X 2) w Reflex to ID Panel     Status: None   Collection Time: 08/13/18  4:23 PM  Result Value Ref [place] Status   Specimen Description BLOOD FOOT  Final   Special Requests   Final    BOTTLES DRAWN AEROBIC ONLY Blood Culture results may not be optimal due to an inadequate volume of blood received in culture bottles   Culture   Final    NO GROWTH 5 DAYS Performed at [facility], [address] [place], [place] [address]    Report Status 08/18/2018 FINAL  Final  Culture, blood (Routine X 2) w Reflex to ID Panel     Status: None   Collection Time: 08/13/18  4:41 PM  Result Value Ref [place] Status   Specimen Description BLOOD FOOT  Final   Special Requests   Final    BOTTLES DRAWN AEROBIC ONLY Blood Culture results may not be optimal due to an inadequate volume of blood received in culture bottles   Culture   Final    NO GROWTH 5 DAYS Performed at [facility], [address], [place], [place] [address]    Report Status 08/18/2018 FINAL  Final  Surgical pcr screen     Status: None   Collection Time: 08/16/18  4:03 AM  Result Value Ref [place] Status   MRSA, PCR NEGATIVE NEGATIVE Final   Staphylococcus aureus NEGATIVE NEGATIVE Final    Comment: (NOTE) The Xpert SA Assay (FDA approved for NASAL specimens in patients 74 years of age and older), is one component of a comprehensive surveillance program. It is not intended to diagnose infection nor to guide or monitor treatment. Performed at [facility], [address], [place], [place] [address]   Culture, blood (routine x 2)     Status: Abnormal   Collection Time: 08/18/18 11:50 AM  Result Value Ref [place] Status   Specimen Description BLOOD RIGHT HAND  Final   Special Requests   Final    BOTTLES DRAWN AEROBIC ONLY Blood Culture adequate volume   Culture  Setup Time   Final    GRAM NEGATIVE RODS AEROBIC BOTTLE ONLY CRITICAL RESULT CALLED TO, READ BACK BY AND VERIFIED WITH: V. BRYK,PHARMD [id number] 08/19/2018 T. TYSOR    Culture (A)  Final    SERRATIA MARCESCENS PSEUDOMONAS PUTIDA CRITICAL RESULT CALLED TO, READ BACK BY AND VERIFIED WITH: PHARMD M LORI [id number] AT 757 AM BY CM Performed at [facility], [address] [place], [place] [address]    Report Status 08/22/2018 FINAL  Final   Organism ID, Bacteria SERRATIA MARCESCENS  Final   Organism ID, Bacteria PSEUDOMONAS PUTIDA  Final      Susceptibility   Pseudomonas putida - MIC*    CEFTAZIDIME 16 INTERMEDIATE Intermediate     CIPROFLOXACIN 0.5 SENSITIVE Sensitive     GENTAMICIN <=1 SENSITIVE Sensitive     IMIPENEM 2 SENSITIVE Sensitive  PIP/TAZO >=128 RESISTANT Resistant     CEFEPIME 8 SENSITIVE Sensitive     * PSEUDOMONAS PUTIDA   Serratia marcescens - MIC*    CEFAZOLIN >=64 RESISTANT Resistant     CEFEPIME <=1 SENSITIVE Sensitive     CEFTAZIDIME <=1 SENSITIVE Sensitive     CEFTRIAXONE <=1 SENSITIVE Sensitive      CIPROFLOXACIN <=0.25 SENSITIVE Sensitive     GENTAMICIN <=1 SENSITIVE Sensitive     TRIMETH/SULFA <=20 SENSITIVE Sensitive     * SERRATIA MARCESCENS  Blood Culture ID Panel (Reflexed)     Status: Abnormal   Collection Time: 08/18/18 11:50 AM  Result Value Ref [place] Status   Enterococcus species NOT DETECTED NOT DETECTED Final   Listeria monocytogenes NOT DETECTED NOT DETECTED Final   Staphylococcus species NOT DETECTED NOT DETECTED Final   Staphylococcus aureus (BCID) NOT DETECTED NOT DETECTED Final   Streptococcus species NOT DETECTED NOT DETECTED Final   Streptococcus agalactiae NOT DETECTED NOT DETECTED Final   Streptococcus pneumoniae NOT DETECTED NOT DETECTED Final   Streptococcus pyogenes NOT DETECTED NOT DETECTED Final   Acinetobacter baumannii NOT DETECTED NOT DETECTED Final   Enterobacteriaceae species DETECTED (A) NOT DETECTED Final    Comment: Enterobacteriaceae represent a large family of gram-negative bacteria, not a single organism. CRITICAL RESULT CALLED TO, READ BACK BY AND VERIFIED WITH: V. Joan Mayans 1610 08/19/2018 T. TYSOR    Enterobacter cloacae complex NOT DETECTED NOT DETECTED Final   Escherichia coli NOT DETECTED NOT DETECTED Final   Klebsiella oxytoca NOT DETECTED NOT DETECTED Final   Klebsiella pneumoniae NOT DETECTED NOT DETECTED Final   Proteus species NOT DETECTED NOT DETECTED Final   Serratia marcescens DETECTED (A) NOT DETECTED Final    Comment: CRITICAL RESULT CALLED TO, READ BACK BY AND VERIFIED WITH: VJoan Mayans [id number] 08/19/2018 T. TYSOR    Carbapenem resistance NOT DETECTED NOT DETECTED Final   Haemophilus influenzae NOT DETECTED NOT DETECTED Final   Neisseria meningitidis NOT DETECTED NOT DETECTED Final   Pseudomonas aeruginosa NOT DETECTED NOT DETECTED Final   Candida albicans NOT DETECTED NOT DETECTED Final   Candida glabrata NOT DETECTED NOT DETECTED Final   Candida krusei NOT DETECTED NOT DETECTED Final   Candida parapsilosis NOT  DETECTED NOT DETECTED Final   Candida tropicalis NOT DETECTED NOT DETECTED Final    Comment: Performed at [facility], [address], [place], [place] [address]  Culture, blood (routine x 2)     Status: None   Collection Time: 08/18/18 11:58 AM  Result Value Ref Range Status   Specimen Description BLOOD RIGHT ANTECUBITAL  Final   Special Requests   Final    BOTTLES DRAWN AEROBIC ONLY Blood Culture adequate volume   Culture   Final    NO GROWTH 5 DAYS Performed at [facility], [address], [place], [place] [address]    Report Status 08/23/2018 FINAL  Final  MRSA PCR Screening     Status: None   Collection Time: 09/21/18  1:30 PM  Result Value Ref [place] Status   MRSA by PCR NEGATIVE NEGATIVE Final    Comment:        The GeneXpert MRSA Assay (FDA approved for NASAL specimens only), is one component of a comprehensive MRSA colonization surveillance program. It is not intended to diagnose MRSA infection nor to guide or monitor treatment for MRSA infections. Performed at [facility], [address], [place], [place] [address]   Culture, blood (routine x 2)  Status: Abnormal   Collection Time: 09/24/18  3:55 PM  Result Value Ref Range Status   Specimen Description BLOOD RIGHT ANTECUBITAL  Final   Special Requests   Final    BOTTLES DRAWN AEROBIC ONLY Blood Culture adequate volume   Culture  Setup Time   Final    GRAM POSITIVE COCCI IN CLUSTERS AEROBIC BOTTLE ONLY CRITICAL RESULT CALLED TO, READ BACK BY AND VERIFIED WITH: J Sedgwick PHARMD 09/25/18 1751 JDW    Culture (A)  Final    STAPHYLOCOCCUS SPECIES (COAGULASE NEGATIVE) THE SIGNIFICANCE OF ISOLATING THIS ORGANISM FROM A SINGLE SET OF BLOOD CULTURES WHEN MULTIPLE SETS ARE DRAWN IS UNCERTAIN. PLEASE NOTIFY THE MICROBIOLOGY DEPARTMENT WITHIN ONE WEEK IF SPECIATION AND SENSITIVITIES ARE REQUIRED. Performed at [facility], [address], [place], [place] [address]    Report Status 09/26/2018  FINAL  Final  Blood Culture ID Panel (Reflexed)     Status: Abnormal   Collection Time: 09/24/18  3:55 PM  Result Value Ref Range Status   Enterococcus species NOT DETECTED NOT DETECTED Final   Listeria monocytogenes NOT DETECTED NOT DETECTED Final   Staphylococcus species DETECTED (A) NOT DETECTED Final    Comment: Methicillin (oxacillin) resistant coagulase negative staphylococcus. Possible blood culture contaminant (unless isolated from more than one blood culture draw or clinical case suggests pathogenicity). No antibiotic treatment is indicated for blood  culture contaminants. CRITICAL RESULT CALLED TO, READ BACK BY AND VERIFIED WITH: J [place] PHARMD 09/25/18 1751 JDW    Staphylococcus aureus (BCID) NOT DETECTED NOT DETECTED Final   Methicillin resistance DETECTED (A) NOT DETECTED Final    Comment: CRITICAL RESULT CALLED TO, READ BACK BY AND VERIFIED WITH: J [place] PHARMD 09/25/18 1751 JDW    Streptococcus species NOT DETECTED NOT DETECTED Final   Streptococcus agalactiae NOT DETECTED NOT DETECTED Final   Streptococcus pneumoniae NOT DETECTED NOT DETECTED Final   Streptococcus pyogenes NOT DETECTED NOT DETECTED Final   Acinetobacter baumannii NOT DETECTED NOT DETECTED Final   Enterobacteriaceae species NOT DETECTED NOT DETECTED Final   Enterobacter cloacae complex NOT DETECTED NOT DETECTED Final   Escherichia coli NOT DETECTED NOT DETECTED Final   Klebsiella oxytoca NOT DETECTED NOT DETECTED Final   Klebsiella pneumoniae NOT DETECTED NOT DETECTED Final   Proteus species NOT DETECTED NOT DETECTED Final   Serratia marcescens NOT DETECTED NOT DETECTED Final   Haemophilus influenzae NOT DETECTED NOT DETECTED Final   Neisseria meningitidis NOT DETECTED NOT DETECTED Final   Pseudomonas aeruginosa NOT DETECTED NOT DETECTED Final   Candida albicans NOT DETECTED NOT DETECTED Final   Candida glabrata NOT DETECTED NOT DETECTED Final   Candida krusei NOT DETECTED NOT DETECTED Final    Candida parapsilosis NOT DETECTED NOT DETECTED Final   Candida tropicalis NOT DETECTED NOT DETECTED Final    Comment: Performed at [facility], [address], [place], [place] [address]  Culture, blood (routine x 2)     Status: None   Collection Time: 09/24/18  3:59 PM  Result Value Ref Range Status   Specimen Description BLOOD HAND  Final   Special Requests   Final    BOTTLES DRAWN AEROBIC ONLY Blood Culture adequate volume   Culture   Final    NO GROWTH 5 DAYS Performed at [facility], [address] [place], [place] [address]    Report Status 09/29/2018 FINAL  Final  Culture, Urine     Status: Abnormal   Collection Time: 09/24/18  4:22  PM  Result Value Ref Range Status   Specimen Description URINE, RANDOM  Final   Special Requests   Final    NONE Performed at [facility], [address] [place], [place] [address]    Culture >=100,000 COLONIES/mL ENTEROCOCCUS FAECALIS (A)  Final   Report Status 09/26/2018 FINAL  Final   Organism ID, Bacteria ENTEROCOCCUS FAECALIS (A)  Final      Susceptibility   Enterococcus faecalis - MIC*    AMPICILLIN <=2 SENSITIVE Sensitive     LEVOFLOXACIN >=8 RESISTANT Resistant     NITROFURANTOIN <=16 SENSITIVE Sensitive     VANCOMYCIN 1 SENSITIVE Sensitive     * >=100,000 COLONIES/mL ENTEROCOCCUS FAECALIS  Culture, Urine     Status: None   Collection Time: 09/28/18  6:53 PM  Result Value Ref Range Status   Specimen Description URINE, CATHETERIZED  Final   Special Requests Immunocompromised  Final   Culture   Final    NO GROWTH Performed at [facility], [address] [place], [place] [address]    Report Status 09/29/2018 FINAL  Final  Culture, Urine     Status: None   Collection Time: 10/06/18  8:59 AM  Result Value Ref Range Status   Specimen Description URINE, RANDOM  Final   Special Requests NONE  Final   Culture   Final    NO GROWTH Performed at [facility], [address] [place], [place]  [address]    Report Status 10/07/2018 FINAL  Final  Culture, blood (routine x 2)     Status: None   Collection Time: 10/06/18  9:55 AM  Result Value Ref Range Status   Specimen Description BLOOD RIGHT FOREARM  Final   Special Requests   Final    AEROBIC BOTTLE ONLY Blood Culture results may not be optimal due to an excessive volume of blood received in culture bottles   Culture   Final    NO GROWTH 5 DAYS Performed at [facility], [address] [place], [place] [address]    Report Status 10/11/2018 FINAL  Final  Culture, blood (routine x 2)     Status: None   Collection Time: 10/06/18 10:00 AM  Result Value Ref Range Status   Specimen Description BLOOD RIGHT ANTECUBITAL  Final   Special Requests   Final    AEROBIC BOTTLE ONLY Blood Culture results may not be optimal due to an excessive volume of blood received in culture bottles   Culture   Final    NO GROWTH 5 DAYS Performed at [facility], [address], [place], [place] [address]    Report Status 10/11/2018 FINAL  Final    Anti-infectives:  Anti-infectives (From admission, onward)   Start     Dose/Rate Route Frequency Ordered Stop   10/03/18 0000  ceFAZolin (ANCEF) IVPB 2g/100 mL premix     2 g 200 mL/hr over 30 Minutes Intravenous To Surgery 10/02/18 1822 10/03/18 1348   09/27/18 0830  ampicillin (OMNIPEN) 2 g in sodium chloride 0.9 % 100 mL IVPB  Status:  Discontinued     2 g 300 mL/hr over 20 Minutes Intravenous Every 6 hours 09/27/18 0822 10/05/18 0900   09/25/18 2000  vancomycin (VANCOCIN) 1,250 mg in sodium chloride 0.9 % 250 mL IVPB  Status:  Discontinued     1,250 mg 166.7 mL/hr over 90 Minutes Intravenous Every 12 hours 09/25/18 1854 09/27/18 0822   09/25/18 0800  ceFEPIme (  MAXIPIME) 2 g in sodium chloride 0.9 % 100 mL IVPB  Status:  Discontinued     2 g 200 mL/hr over 30 Minutes Intravenous Every 8 hours 09/25/18 0750 09/27/18 0822   09/19/18 1257  polymyxin B 500,000 Units, bacitracin 50,000 Units  in sodium chloride 0.9 % 500 mL irrigation  Status:  Discontinued       As needed 09/19/18 1257 09/19/18 1608   09/19/18 1100  ceFAZolin (ANCEF) IVPB 2g/100 mL premix     2 g 200 mL/hr over 30 Minutes Intravenous To Surgery 09/18/18 2055 09/19/18 1334   08/29/18 1512  polymyxin B 500,000 Units, bacitracin 50,000 Units in sodium chloride 0.9 % 500 mL irrigation  Status:  Discontinued       As needed 08/29/18 1512 08/29/18 1721   08/23/18 1200  meropenem (MERREM) 1 g in sodium chloride 0.9 % 100 mL IVPB     1 g 200 mL/hr over 30 Minutes Intravenous Every 8 hours 08/23/18 1122 09/05/18 1311   08/22/18 1400  ceFEPIme (MAXIPIME) 2 g in sodium chloride 0.9 % 100 mL IVPB  Status:  Discontinued     2 g 200 mL/hr over 30 Minutes Intravenous Every 8 hours 08/22/18 0836 08/22/18 1027   08/22/18 1200  meropenem (MERREM) 2 g in sodium chloride 0.9 % 100 mL IVPB  Status:  Discontinued     2 g 200 mL/hr over 30 Minutes Intravenous Every 8 hours 08/22/18 1027 08/23/18 1122   08/21/18 1400  ceFEPIme (MAXIPIME) 1 g in sodium chloride 0.9 % 100 mL IVPB  Status:  Discontinued     1 g 200 mL/hr over 30 Minutes Intravenous Every 8 hours 08/21/18 1042 08/22/18 0836   08/19/18 1030  cefTRIAXone (ROCEPHIN) 2 g in sodium chloride 0.9 % 100 mL IVPB  Status:  Discontinued     2 g 200 mL/hr over 30 Minutes Intravenous Every 24 hours 08/19/18 1017 08/21/18 1042   08/13/18 1400  piperacillin-tazobactam (ZOSYN) IVPB 3.375 g  Status:  Discontinued     3.375 g 12.5 mL/hr over 240 Minutes Intravenous Every 8 hours 08/13/18 0928 08/16/18 0854   08/13/18 1354  polymyxin B 500,000 Units, bacitracin 50,000 Units in sodium chloride 0.9 % 500 mL irrigation  Status:  Discontinued       As needed 08/13/18 1354 08/13/18 1538   08/13/18 0930  piperacillin-tazobactam (ZOSYN) IVPB 3.375 g     3.375 g 100 mL/hr over 30 Minutes Intravenous  Once 08/13/18 0928 08/13/18 1200   08/06/18 0801  polymyxin B 500,000 Units, bacitracin 50,000  Units in sodium chloride 0.9 % 500 mL irrigation  Status:  Discontinued       As needed 08/06/18 0803 08/06/18 0951   08/06/18 0600  ceFAZolin (ANCEF) IVPB 2g/100 mL premix  Status:  Discontinued     2 g 200 mL/hr over 30 Minutes Intravenous On call to O.R. 08/05/18 2241 08/05/18 2241   08/06/18 0600  ceFAZolin (ANCEF) IVPB 2g/100 mL premix  Status:  Discontinued     2 g 200 mL/hr over 30 Minutes Intravenous To Short Stay 08/05/18 2241 08/06/18 1024   08/01/18 2200  Ampicillin-Sulbactam (UNASYN) 3 g in sodium chloride 0.9 % 100 mL IVPB     3 g 200 mL/hr over 30 Minutes Intravenous Every 6 hours 08/01/18 2146 08/08/18 2235   08/01/18 1630  piperacillin-tazobactam (ZOSYN) IVPB 3.375 g  Status:  Discontinued     3.375 g 12.5 mL/hr over 240 Minutes Intravenous Every 8 hours  08/01/18 1624 08/01/18 2139   07/25/18 1508  polymyxin B 500,000 Units, bacitracin 50,000 Units in sodium chloride 0.9 % 500 mL irrigation  Status:  Discontinued       As needed 07/25/18 1509 07/25/18 1750   07/25/18 1445  piperacillin-tazobactam (ZOSYN) IVPB 3.375 g     3.375 g 100 mL/hr over 30 Minutes Intravenous STAT 07/25/18 1443 07/25/18 1620   07/25/18 0600  ceFAZolin (ANCEF) 3 g in dextrose 5 % 50 mL IVPB  Status:  Discontinued     3 g 100 mL/hr over 30 Minutes Intravenous To ShortStay Surgical 07/24/18 1735 07/25/18 1753   07/18/18 1444  polymyxin B 500,000 Units, bacitracin 50,000 Units in sodium chloride 0.9 % 500 mL irrigation  Status:  Discontinued       As needed 07/18/18 1445 07/18/18 1738   07/17/18 0900  piperacillin-tazobactam (ZOSYN) IVPB 3.375 g  Status:  Discontinued     3.375 g 12.5 mL/hr over 240 Minutes Intravenous Every 8 hours 07/17/18 0810 07/27/18 0806   07/12/18 1430  metronidazole (FLAGYL) IVPB 500 mg  Status:  Discontinued     500 mg 100 mL/hr over 60 Minutes Intravenous To Surgery 07/12/18 1427 07/12/18 1608   07/12/18 1430  cefTRIAXone (ROCEPHIN) 2 g in sodium chloride 0.9 % 100 mL IVPB   Status:  Discontinued     2 g 200 mL/hr over 30 Minutes Intravenous To Surgery 07/12/18 1427 07/12/18 1608   07/12/18 1245  tobramycin (NEBCIN) powder  Status:  Discontinued       As needed 07/12/18 1245 07/12/18 1542   07/12/18 1243  vancomycin (VANCOCIN) powder  Status:  Discontinued       As needed 07/12/18 1244 07/12/18 1542   07/11/18 1330  cefTRIAXone (ROCEPHIN) 2 g in sodium chloride 0.9 % 100 mL IVPB  Status:  Discontinued     2 g 200 mL/hr over 30 Minutes Intravenous Every 24 hours 07/11/18 1259 07/17/18 0810   07/11/18 1300  metroNIDAZOLE (FLAGYL) IVPB 500 mg  Status:  Discontinued     500 mg 100 mL/hr over 60 Minutes Intravenous Every 8 hours 07/11/18 1259 07/17/18 0810   07/11/18 0400  ceFAZolin (ANCEF) IVPB 2g/100 mL premix  Status:  Discontinued     2 g 200 mL/hr over 30 Minutes Intravenous Every 8 hours 07/10/18 2109 07/11/18 1259   07/10/18 1815  ceFAZolin (ANCEF) IVPB 2g/100 mL premix  Status:  Discontinued     2 g 200 mL/hr over 30 Minutes Intravenous  Once 07/10/18 1814 07/10/18 2339      Best Practice/Protocols:  VTE Prophylaxis: Lovenox (prophylaxtic dose) and Mechanical  Consults: Treatment Team:  Md, Trauma, MD Haddix, Gillie Manners, MD Dillingham, Alena Bills, DO     Subjective:    Overnight Issues: tachycardia and ostomy leaked. Ostomy appliance replaced this

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
## 2018-10-16 ENCOUNTER — Other Ambulatory Visit (HOSPITAL_COMMUNITY): Payer: Self-pay

## 2018-10-16 ENCOUNTER — Inpatient Hospital Stay
Admission: AD | Admit: 2018-10-16 | Discharge: 2018-11-23 | Disposition: A | Discharge status: Rehabilitation Facility | Payer: PRIVATE HEALTH INSURANCE | Source: Other Acute Inpatient Hospital | Attending: Internal Medicine | Admitting: Internal Medicine

## 2018-10-16 DIAGNOSIS — J9690 Respiratory failure, unspecified, unspecified whether with hypoxia or hypercapnia: Secondary | ICD-10-CM

## 2018-10-16 DIAGNOSIS — J189 Pneumonia, unspecified organism: Secondary | ICD-10-CM

## 2018-10-16 DIAGNOSIS — T07XXXA Unspecified multiple injuries, initial encounter: Secondary | ICD-10-CM | POA: Diagnosis present

## 2018-10-16 DIAGNOSIS — G894 Chronic pain syndrome: Secondary | ICD-10-CM | POA: Diagnosis present

## 2018-10-16 DIAGNOSIS — J9601 Acute respiratory failure with hypoxia: Secondary | ICD-10-CM | POA: Diagnosis present

## 2018-10-16 DIAGNOSIS — Z931 Gastrostomy status: Secondary | ICD-10-CM

## 2018-10-16 DIAGNOSIS — R112 Nausea with vomiting, unspecified: Secondary | ICD-10-CM

## 2018-10-16 DIAGNOSIS — B965 Pseudomonas (aeruginosa) (mallei) (pseudomallei) as the cause of diseases classified elsewhere: Secondary | ICD-10-CM | POA: Diagnosis present

## 2018-10-16 DIAGNOSIS — R509 Fever, unspecified: Secondary | ICD-10-CM

## 2018-10-16 DIAGNOSIS — R7881 Bacteremia: Secondary | ICD-10-CM | POA: Diagnosis present

## 2018-10-16 DIAGNOSIS — J9621 Acute and chronic respiratory failure with hypoxia: Secondary | ICD-10-CM | POA: Diagnosis present

## 2018-10-16 HISTORY — DX: Chronic pain syndrome: G89.4

## 2018-10-16 HISTORY — DX: Acute and chronic respiratory failure with hypoxia: J96.21

## 2018-10-16 HISTORY — DX: Bacteremia: R78.81

## 2018-10-16 HISTORY — DX: Unspecified multiple injuries, initial encounter: T07.XXXA

## 2018-10-16 LAB — GLUCOSE, CAPILLARY
Glucose-Capillary: 151 mg/dL — ABNORMAL HIGH (ref 70–99)
Glucose-Capillary: 105 mg/dL — ABNORMAL HIGH (ref 70–99)
Glucose-Capillary: 140 mg/dL — ABNORMAL HIGH (ref 70–99)
Glucose-Capillary: 112 mg/dL — ABNORMAL HIGH (ref 70–99)

## 2018-10-16 LAB — VITAMIN C: Vitamin C: 2.3 mg/dL — ABNORMAL HIGH (ref 0.4–2.0)

## 2018-10-16 MED ORDER — METOPROLOL TARTRATE 25 MG/10 ML ORAL SUSPENSION: 25.0000 mg | Freq: Two times a day (BID) | ORAL | Status: DC

## 2018-10-16 MED ORDER — FENTANYL CITRATE (PF) 100 MCG/2ML IJ SOLN: 50.0000 ug | INTRAMUSCULAR | 0 refills | Status: DC | PRN

## 2018-10-16 MED ORDER — ADULT MULTIVITAMIN LIQUID CH: 15.0000 mL | Freq: Every day | ORAL | Status: DC

## 2018-10-16 MED ORDER — PANTOPRAZOLE SODIUM 40 MG PO PACK: 40.0000 mg | PACK | Freq: Every day | ORAL | Status: DC

## 2018-10-16 MED ORDER — CHLORHEXIDINE GLUCONATE 0.12% ORAL RINSE (MEDLINE KIT): 15.0000 mL | Freq: Two times a day (BID) | OROMUCOSAL | 0 refills | Status: DC

## 2018-10-16 MED ORDER — IBUPROFEN 100 MG/5ML PO SUSP: 400.0000 mg | Freq: Three times a day (TID) | ORAL | Status: DC | PRN

## 2018-10-16 MED ORDER — CLONIDINE HCL 0.2 MG PO TABS: 0.2000 mg | ORAL_TABLET | Freq: Two times a day (BID) | ORAL | Status: DC

## 2018-10-16 MED ORDER — HYDROMORPHONE HCL 1 MG/ML IJ SOLN: 1.0000 mg | INTRAMUSCULAR | 0 refills | Status: DC | PRN

## 2018-10-16 MED ORDER — CLONAZEPAM 2 MG PO TABS: 2.0000 mg | ORAL_TABLET | Freq: Three times a day (TID) | ORAL | 0 refills | Status: DC

## 2018-10-16 MED ORDER — POLYETHYLENE GLYCOL 3350 17 G PO PACK: 17.0000 g | PACK | Freq: Every day | ORAL | 0 refills | Status: DC

## 2018-10-16 MED ORDER — ARTIFICIAL TEARS OPHTHALMIC OINT: TOPICAL_OINTMENT | OPHTHALMIC | Status: DC | PRN

## 2018-10-16 MED ORDER — FENTANYL 75 MCG/HR TD PT72: 1.0000 | MEDICATED_PATCH | TRANSDERMAL | 0 refills | Status: DC

## 2018-10-16 MED ORDER — CIPROFLOXACIN HCL 500 MG PO TABS: 500.0000 mg | ORAL_TABLET | Freq: Two times a day (BID) | ORAL | Status: DC

## 2018-10-16 MED ORDER — PRO-STAT SUGAR FREE PO LIQD: 60.0000 mL | Freq: Two times a day (BID) | ORAL | 0 refills | Status: DC

## 2018-10-16 MED ORDER — ORAL CARE MOUTH RINSE: 15.0000 mL | Freq: Every day | OROMUCOSAL | 0 refills | Status: DC

## 2018-10-16 MED ORDER — QUETIAPINE FUMARATE 200 MG PO TABS: 200.0000 mg | ORAL_TABLET | Freq: Three times a day (TID) | ORAL | Status: DC

## 2018-10-16 MED ORDER — JUVEN PO PACK: 1.0000 | PACK | Freq: Two times a day (BID) | ORAL | 0 refills | Status: DC

## 2018-10-16 MED ORDER — GABAPENTIN 250 MG/5ML PO SOLN: 300.0000 mg | Freq: Three times a day (TID) | ORAL | 12 refills | Status: DC

## 2018-10-16 MED ORDER — METOCLOPRAMIDE HCL 5 MG/5ML PO SOLN: 10.0000 mg | Freq: Four times a day (QID) | ORAL | 0 refills | Status: DC

## 2018-10-16 MED ORDER — ASCORBIC ACID 500 MG PO TABS: 500.0000 mg | ORAL_TABLET | Freq: Two times a day (BID) | ORAL | Status: DC

## 2018-10-16 MED ORDER — CHLORHEXIDINE GLUCONATE CLOTH 2 % EX PADS: 6.0000 | MEDICATED_PAD | Freq: Every day | CUTANEOUS | Status: DC

## 2018-10-16 MED ORDER — SODIUM CHLORIDE 0.9 % IR SOLN: 100.0000 mL | Freq: Two times a day (BID) | 0 refills | Status: DC

## 2018-10-16 MED ORDER — FREE WATER: 300.0000 mL | Status: DC

## 2018-10-16 MED ORDER — NYSTATIN 100000 UNIT/ML MT SUSP: 5.0000 mL | Freq: Four times a day (QID) | OROMUCOSAL | 0 refills | Status: DC

## 2018-10-16 MED ORDER — POTASSIUM CHLORIDE 20 MEQ/15ML (10%) PO SOLN: 40.0000 meq | Freq: Three times a day (TID) | ORAL | 0 refills | Status: DC

## 2018-10-16 MED ORDER — ALBUTEROL SULFATE (2.5 MG/3ML) 0.083% IN NEBU: 2.5000 mg | INHALATION_SOLUTION | RESPIRATORY_TRACT | 12 refills | Status: DC | PRN

## 2018-10-16 MED ORDER — IOHEXOL 300 MG/ML  SOLN
50.0000 mL | Freq: Once | INTRAMUSCULAR | Status: AC | PRN
  Administered 2018-10-16: 50 mL

## 2018-10-16 MED ORDER — ACETAMINOPHEN 160 MG/5ML PO SOLN: 650.0000 mg | Freq: Four times a day (QID) | ORAL | 0 refills | Status: DC

## 2018-10-16 MED ORDER — ONDANSETRON HCL 4 MG/2ML IJ SOLN: 4.0000 mg | Freq: Four times a day (QID) | INTRAMUSCULAR | 0 refills | Status: DC | PRN

## 2018-10-16 MED ORDER — INSULIN ASPART 100 UNIT/ML ~~LOC~~ SOLN: 0.0000 [IU] | SUBCUTANEOUS | 11 refills | Status: DC

## 2018-10-16 MED ORDER — CIPROFLOXACIN HCL 500 MG PO TABS
500.0000 mg | ORAL_TABLET | Freq: Two times a day (BID) | ORAL | Status: DC
  Filled 2018-10-16 (×2): qty 1

## 2018-10-16 MED ORDER — ENOXAPARIN SODIUM 40 MG/0.4ML ~~LOC~~ SOLN: 40.0000 mg | SUBCUTANEOUS | Status: DC

## 2018-10-16 MED ORDER — METOPROLOL TARTRATE 5 MG/5ML IV SOLN: 5.0000 mg | Freq: Four times a day (QID) | INTRAVENOUS | Status: DC | PRN

## 2018-10-16 MED ORDER — LORAZEPAM 2 MG/ML IJ SOLN: 1.0000 mg | INTRAMUSCULAR | 0 refills | Status: DC | PRN

## 2018-10-16 MED ORDER — POTASSIUM CHLORIDE 20 MEQ/15ML (10%) PO SOLN
40.0000 meq | Freq: Three times a day (TID) | ORAL | Status: DC
  Administered 2018-10-16: 17:00:00 40 meq
  Filled 2018-10-16: qty 30

## 2018-10-16 MED ORDER — VITAL 1.5 CAL PO LIQD: 1000.0000 mL | ORAL | Status: DC

## 2018-10-16 MED ORDER — ALTEPLASE 2 MG IJ SOLR: 2.0000 mg | Freq: Once | INTRAMUSCULAR | Status: AC

## 2018-10-16 MED ORDER — SODIUM CHLORIDE 0.9 % IV SOLN: 10.0000 mL | INTRAVENOUS | 0 refills | Status: DC | PRN
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
## 2018-10-16 NOTE — Progress Notes (Signed)
RT at bedside assisting Speech Therapy with PMV trial on vent patient. Patient tolerated well.
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
## 2018-10-16 NOTE — Progress Notes (Signed)
Patient transported using an ambu bag with manual ventilation from [facility] to [facility] without complications.
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
## 2018-10-16 NOTE — Progress Notes (Signed)
Assisted tele visit to patient with partner.  Kahli Mayon Ferrer, RN   

## 2018-10-17 ENCOUNTER — Encounter (HOSPITAL_BASED_OUTPATIENT_CLINIC_OR_DEPARTMENT_OTHER): Payer: Self-pay | Admitting: Emergency Medicine

## 2018-10-17 DIAGNOSIS — R7881 Bacteremia: Secondary | ICD-10-CM

## 2018-10-17 DIAGNOSIS — T07XXXA Unspecified multiple injuries, initial encounter: Secondary | ICD-10-CM

## 2018-10-17 DIAGNOSIS — J9621 Acute and chronic respiratory failure with hypoxia: Secondary | ICD-10-CM

## 2018-10-17 DIAGNOSIS — G894 Chronic pain syndrome: Secondary | ICD-10-CM

## 2018-10-17 LAB — BLOOD GAS, ARTERIAL
Acid-Base Excess: 5.5 mmol/L — ABNORMAL HIGH (ref 0.0–2.0)
Bicarbonate: 28.8 mmol/L — ABNORMAL HIGH (ref 20.0–28.0)
FIO2: 30
MECHVT: 650 mL
O2 Saturation: 99.7 %
PEEP: 5 cmH2O
Patient temperature: 98.9
RATE: 18 resp/min
pCO2 arterial: 37 mmHg (ref 32.0–48.0)
pH, Arterial: 7.504 — ABNORMAL HIGH (ref 7.350–7.450)
pO2, Arterial: 169 mmHg — ABNORMAL HIGH (ref 83.0–108.0)

## 2018-10-17 LAB — COMPREHENSIVE METABOLIC PANEL
ALT: 71 U/L — ABNORMAL HIGH (ref 0–44)
AST: 41 U/L (ref 15–41)
Albumin: 1.9 g/dL — ABNORMAL LOW (ref 3.5–5.0)
Alkaline Phosphatase: 273 U/L — ABNORMAL HIGH (ref 38–126)
Anion gap: 8 (ref 5–15)
BUN: 23 mg/dL — ABNORMAL HIGH (ref 6–20)
CO2: 27 mmol/L (ref 22–32)
Calcium: 10.6 mg/dL — ABNORMAL HIGH (ref 8.9–10.3)
Chloride: 108 mmol/L (ref 98–111)
Creatinine, Ser: 0.38 mg/dL — ABNORMAL LOW (ref 0.61–1.24)
GFR calc Af Amer: >60 mL/min (ref >60)
GFR calc non Af Amer: >60 mL/min (ref >60)
Glucose, Bld: 121 mg/dL — ABNORMAL HIGH (ref 70–99)
Potassium: 3.2 mmol/L — ABNORMAL LOW (ref 3.5–5.1)
Sodium: 143 mmol/L (ref 135–145)
Total Bilirubin: 0.4 mg/dL (ref 0.3–1.2)
Total Protein: 7.2 g/dL (ref 6.5–8.1)

## 2018-10-17 LAB — PROTIME-INR
INR: 1 (ref 0.8–1.2)
Prothrombin Time: 13.5 seconds (ref 11.4–15.2)

## 2018-10-17 LAB — URINALYSIS, ROUTINE W REFLEX MICROSCOPIC
Bilirubin Urine: NEGATIVE
Glucose, UA: NEGATIVE mg/dL
Ketones, ur: NEGATIVE mg/dL
Nitrite: NEGATIVE
Protein, ur: 30 mg/dL — AB
RBC / HPF: >50 RBC/hpf — ABNORMAL HIGH (ref 0–5)
Specific Gravity, Urine: 1.016 (ref 1.005–1.030)
WBC, UA: >50 WBC/hpf — ABNORMAL HIGH (ref 0–5)
pH: 6 (ref 5.0–8.0)

## 2018-10-17 LAB — URINE CULTURE

## 2018-10-17 LAB — CBC
HCT: 29.4 % — ABNORMAL LOW (ref 39.0–52.0)
Hemoglobin: 8.7 g/dL — ABNORMAL LOW (ref 13.0–17.0)
MCH: 32.3 pg (ref 26.0–34.0)
MCHC: 29.6 g/dL — ABNORMAL LOW (ref 30.0–36.0)
MCV: 109.3 fL — ABNORMAL HIGH (ref 80.0–100.0)
Platelets: 463 10*3/uL — ABNORMAL HIGH (ref 150–400)
RBC: 2.69 MIL/uL — ABNORMAL LOW (ref 4.22–5.81)
RDW: 17.2 % — ABNORMAL HIGH (ref 11.5–15.5)
WBC: 9.1 10*3/uL (ref 4.0–10.5)
nRBC: 0 % (ref 0.0–0.2)

## 2018-10-17 NOTE — Procedures (Signed)
Tracheostomy Change Note  Patient Details:   Name: Neyland Brunning DOB: 08-Sep-1965 MRN: [id number]    Airway Documentation:     Evaluation  O2 sats: stable throughout Complications: No apparent complications Patient did tolerate procedure well. Bilateral Breath Sounds: Rhonchi, Diminished    Rayburn Felt 10/17/2018, 7:35 AM
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
## 2018-10-18 ENCOUNTER — Encounter: Payer: Self-pay | Admitting: Internal Medicine

## 2018-10-18 DIAGNOSIS — T07XXXA Unspecified multiple injuries, initial encounter: Secondary | ICD-10-CM | POA: Diagnosis present

## 2018-10-18 DIAGNOSIS — J9601 Acute respiratory failure with hypoxia: Secondary | ICD-10-CM | POA: Diagnosis present

## 2018-10-18 DIAGNOSIS — J9621 Acute and chronic respiratory failure with hypoxia: Secondary | ICD-10-CM | POA: Diagnosis present

## 2018-10-18 DIAGNOSIS — R7881 Bacteremia: Secondary | ICD-10-CM | POA: Diagnosis present

## 2018-10-18 DIAGNOSIS — G894 Chronic pain syndrome: Secondary | ICD-10-CM | POA: Diagnosis present

## 2018-10-18 DIAGNOSIS — B965 Pseudomonas (aeruginosa) (mallei) (pseudomallei) as the cause of diseases classified elsewhere: Secondary | ICD-10-CM | POA: Diagnosis present

## 2018-10-18 LAB — AEROBIC CULTURE W GRAM STAIN (SUPERFICIAL SPECIMEN)

## 2018-10-18 LAB — AEROBIC CULTURE  (SUPERFICIAL SPECIMEN)
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
## 2018-10-21 LAB — URINE CULTURE: Culture: >=100000 — AB
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
## 2018-10-22 ENCOUNTER — Other Ambulatory Visit (HOSPITAL_COMMUNITY): Payer: Self-pay

## 2018-10-22 ENCOUNTER — Encounter: Payer: Self-pay | Admitting: Registered Nurse

## 2018-10-22 LAB — CULTURE, BLOOD (ROUTINE X 2)
Culture: NO GROWTH
Culture: NO GROWTH
Special Requests: ADEQUATE
Special Requests: ADEQUATE
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
## 2018-10-23 ENCOUNTER — Encounter
Admission: AD | Disposition: A | Discharge status: Rehabilitation Facility | Payer: Self-pay | Attending: Internal Medicine

## 2018-10-23 ENCOUNTER — Encounter (HOSPITAL_COMMUNITY): Payer: Self-pay | Admitting: Registered Nurse

## 2018-10-23 ENCOUNTER — Inpatient Hospital Stay: Admit: 2018-10-23 | Payer: Self-pay | Admitting: Plastic Surgery

## 2018-10-23 DIAGNOSIS — S31819A Unspecified open wound of right buttock, initial encounter: Secondary | ICD-10-CM

## 2018-10-23 DIAGNOSIS — S71102A Unspecified open wound, left thigh, initial encounter: Secondary | ICD-10-CM

## 2018-10-23 DIAGNOSIS — S31829A Unspecified open wound of left buttock, initial encounter: Secondary | ICD-10-CM

## 2018-10-23 HISTORY — PX: INCISION AND DRAINAGE OF WOUND: SHX1803

## 2018-10-23 LAB — CBC
HCT: 27.7 % — ABNORMAL LOW (ref 39.0–52.0)
Hemoglobin: 7.9 g/dL — ABNORMAL LOW (ref 13.0–17.0)
MCH: 32.5 pg (ref 26.0–34.0)
MCHC: 28.5 g/dL — ABNORMAL LOW (ref 30.0–36.0)
MCV: 114 fL — ABNORMAL HIGH (ref 80.0–100.0)
Platelets: 352 10*3/uL (ref 150–400)
RBC: 2.43 MIL/uL — ABNORMAL LOW (ref 4.22–5.81)
RDW: 16.6 % — ABNORMAL HIGH (ref 11.5–15.5)
WBC: 8.7 10*3/uL (ref 4.0–10.5)
nRBC: 0 % (ref 0.0–0.2)

## 2018-10-23 LAB — BASIC METABOLIC PANEL
Anion gap: 9 (ref 5–15)
BUN: 35 mg/dL — ABNORMAL HIGH (ref 6–20)
CO2: 27 mmol/L (ref 22–32)
Calcium: 10.2 mg/dL (ref 8.9–10.3)
Chloride: 119 mmol/L — ABNORMAL HIGH (ref 98–111)
Creatinine, Ser: 0.44 mg/dL — ABNORMAL LOW (ref 0.61–1.24)
GFR calc Af Amer: >60 mL/min (ref >60)
GFR calc non Af Amer: >60 mL/min (ref >60)
Glucose, Bld: 113 mg/dL — ABNORMAL HIGH (ref 70–99)
Potassium: 3.3 mmol/L — ABNORMAL LOW (ref 3.5–5.1)
Sodium: 155 mmol/L — ABNORMAL HIGH (ref 135–145)

## 2018-10-23 LAB — PREPARE RBC (CROSSMATCH)

## 2018-10-23 LAB — ABO/RH: ABO/RH(D): O POS

## 2018-10-23 LAB — SARS CORONAVIRUS 2 BY RT PCR (HOSPITAL ORDER, PERFORMED IN ~~LOC~~ HOSPITAL LAB): SARS Coronavirus 2: NEGATIVE

## 2018-10-23 SURGERY — IRRIGATION AND DEBRIDEMENT WOUND
Anesthesia: General | Site: Groin | Laterality: Bilateral

## 2018-10-23 MED ORDER — SODIUM CHLORIDE 0.9 % IV SOLN
INTRAVENOUS | Status: DC | PRN
  Administered 2018-10-23: 30 ug/min via INTRAVENOUS

## 2018-10-23 MED ORDER — SODIUM CHLORIDE 0.9 % IV SOLN
INTRAVENOUS | Status: DC | PRN
  Administered 2018-10-23: 500 mL

## 2018-10-23 MED ORDER — CEFAZOLIN SODIUM-DEXTROSE 2-3 GM-%(50ML) IV SOLR
INTRAVENOUS | Status: DC | PRN
  Administered 2018-10-23: 2 g via INTRAVENOUS

## 2018-10-23 MED ORDER — ONDANSETRON HCL 4 MG/2ML IJ SOLN
INTRAMUSCULAR | Status: DC | PRN
  Administered 2018-10-23: 4 mg via INTRAVENOUS

## 2018-10-23 MED ORDER — HYDROMORPHONE HCL 1 MG/ML IJ SOLN
INTRAMUSCULAR | Status: DC | PRN
  Administered 2018-10-23 (×2): 0.5 mg via INTRAVENOUS

## 2018-10-23 MED ORDER — MIDAZOLAM HCL 2 MG/2ML IJ SOLN
INTRAMUSCULAR | Status: AC
  Filled 2018-10-23: qty 2

## 2018-10-23 MED ORDER — ROCURONIUM BROMIDE 10 MG/ML (PF) SYRINGE
PREFILLED_SYRINGE | INTRAVENOUS | Status: DC | PRN
  Administered 2018-10-23: 30 mg via INTRAVENOUS
  Administered 2018-10-23: 50 mg via INTRAVENOUS

## 2018-10-23 MED ORDER — LACTATED RINGERS IV SOLN
INTRAVENOUS | Status: DC | PRN
  Administered 2018-10-23: 12:00:00 via INTRAVENOUS

## 2018-10-23 MED ORDER — ALBUMIN HUMAN 5 % IV SOLN
INTRAVENOUS | Status: DC | PRN
  Administered 2018-10-23: 13:00:00 via INTRAVENOUS

## 2018-10-23 MED ORDER — SODIUM CHLORIDE 0.9 % IV SOLN
1.0000 mg/kg/h | INTRAVENOUS | Status: AC
  Administered 2018-10-23: .01 mg/kg/h via INTRAVENOUS
  Filled 2018-10-23: qty 5

## 2018-10-23 MED ORDER — 0.9 % SODIUM CHLORIDE (POUR BTL) OPTIME
TOPICAL | Status: DC | PRN
  Administered 2018-10-23: 1000 mL

## 2018-10-23 MED ORDER — FENTANYL CITRATE (PF) 250 MCG/5ML IJ SOLN
INTRAMUSCULAR | Status: AC
  Filled 2018-10-23: qty 5

## 2018-10-23 MED ORDER — CEFAZOLIN SODIUM 1 G IJ SOLR
INTRAMUSCULAR | Status: AC
  Filled 2018-10-23: qty 20

## 2018-10-23 MED ORDER — HYDROMORPHONE HCL 1 MG/ML IJ SOLN
INTRAMUSCULAR | Status: AC
  Filled 2018-10-23: qty 1

## 2018-10-23 MED ORDER — MIDAZOLAM HCL 5 MG/5ML IJ SOLN
INTRAMUSCULAR | Status: DC | PRN
  Administered 2018-10-23: 2 mg via INTRAVENOUS

## 2018-10-23 MED ORDER — FENTANYL CITRATE (PF) 250 MCG/5ML IJ SOLN
INTRAMUSCULAR | Status: DC | PRN
  Administered 2018-10-23 (×5): 50 ug via INTRAVENOUS

## 2018-10-23 MED ORDER — ONDANSETRON HCL 4 MG/2ML IJ SOLN
INTRAMUSCULAR | Status: AC
  Filled 2018-10-23: qty 2

## 2018-10-23 MED ORDER — SODIUM CHLORIDE 0.9 % IV SOLN
10.0000 mL/h | Freq: Once | INTRAVENOUS | Status: AC
  Administered 2018-10-23: 12:00:00 via INTRAVENOUS

## 2018-10-23 SURGICAL SUPPLY — 51 items
APPLICATOR COTTON TIP 6IN STRL (MISCELLANEOUS) IMPLANT
BAG DECANTER FOR FLEXI CONT (MISCELLANEOUS) ×2 IMPLANT
BENZOIN TINCTURE PRP APPL 2/3 (GAUZE/BANDAGES/DRESSINGS) ×2 IMPLANT
BUR EGG ELITE 5.0 (BURR) ×2 IMPLANT
CANISTER SUCT 3000ML PPV (MISCELLANEOUS) ×2 IMPLANT
CONT SPEC 4OZ CLIKSEAL STRL BL (MISCELLANEOUS) IMPLANT
COVER SURGICAL LIGHT HANDLE (MISCELLANEOUS) ×2 IMPLANT
COVER WAND RF STERILE (DRAPES) IMPLANT
DRAPE HALF SHEET 40X57 (DRAPES) IMPLANT
DRAPE IMP U-DRAPE 54X76 (DRAPES) IMPLANT
DRAPE INCISE IOBAN 66X45 STRL (DRAPES) IMPLANT
DRAPE LAPAROSCOPIC ABDOMINAL (DRAPES) IMPLANT
DRAPE LAPAROTOMY 100X72 PEDS (DRAPES) IMPLANT
DRAPE ORTHO SPLIT 77X108 STRL (DRAPES) ×4
DRAPE SURG ORHT 6 SPLT 77X108 (DRAPES) ×2 IMPLANT
DRESSING HYDROCOLLOID 4X4 (GAUZE/BANDAGES/DRESSINGS) ×2 IMPLANT
DRSG ADAPTIC 3X8 NADH LF (GAUZE/BANDAGES/DRESSINGS) IMPLANT
DRSG CUTIMED SORBACT 7X9 (GAUZE/BANDAGES/DRESSINGS) ×4 IMPLANT
DRSG PAD ABDOMINAL 8X10 ST (GAUZE/BANDAGES/DRESSINGS) IMPLANT
DRSG VAC ATS LRG SENSATRAC (GAUZE/BANDAGES/DRESSINGS) IMPLANT
DRSG VAC ATS MED SENSATRAC (GAUZE/BANDAGES/DRESSINGS) IMPLANT
DRSG VAC ATS SM SENSATRAC (GAUZE/BANDAGES/DRESSINGS) IMPLANT
ELECT CAUTERY BLADE 6.4 (BLADE) ×2 IMPLANT
ELECT REM PT RETURN 9FT ADLT (ELECTROSURGICAL) ×2
ELECTRODE REM PT RTRN 9FT ADLT (ELECTROSURGICAL) ×1 IMPLANT
GAUZE SPONGE 4X4 12PLY STRL (GAUZE/BANDAGES/DRESSINGS) ×2 IMPLANT
GAUZE XEROFORM 5X9 LF (GAUZE/BANDAGES/DRESSINGS) ×12 IMPLANT
GLOVE BIO SURGEON STRL SZ 6.5 (GLOVE) ×2 IMPLANT
GOWN STRL REUS W/ TWL LRG LVL3 (GOWN DISPOSABLE) ×3 IMPLANT
GOWN STRL REUS W/TWL LRG LVL3 (GOWN DISPOSABLE) ×6
KIT BASIN OR (CUSTOM PROCEDURE TRAY) ×2 IMPLANT
KIT TURNOVER KIT B (KITS) ×2 IMPLANT
MATRIX WOUND 3-LAYER 5X5 (Tissue) ×4 IMPLANT
MATRIX WOUND 3-LAYER 7X10 (Tissue) ×6 IMPLANT
MICROMATRIX 1000MG (Tissue) ×2 IMPLANT
NEEDLE HYPO 25GX1X1/2 BEV (NEEDLE) IMPLANT
NS IRRIG 1000ML POUR BTL (IV SOLUTION) ×2 IMPLANT
PACK GENERAL/GYN (CUSTOM PROCEDURE TRAY) ×2 IMPLANT
PACK UNIVERSAL I (CUSTOM PROCEDURE TRAY) IMPLANT
PAD ABD 8X10 STRL (GAUZE/BANDAGES/DRESSINGS) ×14 IMPLANT
PAD ARMBOARD 7.5X6 YLW CONV (MISCELLANEOUS) ×4 IMPLANT
SOLUTION PARTIC MCRMTRX 1000MG (Tissue) ×1 IMPLANT
STAPLER VISISTAT 35W (STAPLE) ×2 IMPLANT
SURGILUBE 2OZ TUBE FLIPTOP (MISCELLANEOUS) ×2 IMPLANT
SUT MNCRL AB 4-0 PS2 18 (SUTURE) IMPLANT
SUT VIC AB 5-0 PS2 18 (SUTURE) ×28 IMPLANT
SWAB COLLECTION DEVICE MRSA (MISCELLANEOUS) IMPLANT
SWAB CULTURE ESWAB REG 1ML (MISCELLANEOUS) IMPLANT
SYR CONTROL 10ML LL (SYRINGE) ×2 IMPLANT
TOWEL OR 17X26 10 PK STRL BLUE (TOWEL DISPOSABLE) ×2 IMPLANT
UNDERPAD 30X30 (UNDERPADS AND DIAPERS) ×2 IMPLANT
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
## 2018-10-23 NOTE — Op Note (Signed)
DATE OF OPERATION: 10/23/2018  LOCATION: [facility] Inpatient  PREOPERATIVE DIAGNOSIS: gluteal and left leg wounds  POSTOPERATIVE DIAGNOSIS: Same  PROCEDURE:  1. Preparation of left leg wound for placement of Acell (7 x 10 cm and 5 x 5 cm) 2. Excision of bone gluteal / iliac 2 x 2 cm 3. Preparation of gluteal wound for placement of Acell (powder 1 gm and sheets 7 x 10 cm, 7 x 10 cm and 5 x 5 cm)  SURGEON: Claire Sanger Dillingham, DO  ASSISTANT: Bonita Cox, RNFA  EBL: 10 cc  CONDITION: Stable  COMPLICATIONS: None  INDICATION: The patient, Christopher [name], is a 53 y.o. male born on 1965/06/28, is here for treatment of the left leg and gluteal wound.   PROCEDURE DETAILS:  The patient was seen prior to surgery and marked.  The IV antibiotics were given. The patient was taken to the operating room and given a general anesthetic. A standard time out was performed and all information was confirmed by those in the room. SCD was placed on the right leg.  The left leg and gluteal area was prepped with betadine and draped.    Left leg:  The wounds were irrigated with saline and antibiotic ointment.  The 7 x 10 cm and 5 x 5 cm sheet of Acell was applied and secured to the skin with the 5-0 Vicryl.  The sorbact was placed over it with KY gel and sterile dressing.   Gluteal / iliac area:  The burr was used to excise the nonviable outer table of 2 x 2 cm of bone.  The area was irrigated with saline and antibiotic solution.  The 1 gm of Acell powder and 5 x 5 cm sheet was applied and secured to the skin with the 5-0 Vicryl.    Gluteal area:  The remaining gluteal area was irrigated with saline and antibiotic solution.  The two 7 x 10 cm sheets were applied and secured to the skin with the 5-0 Vicryl.  The sorbact was secured with the vicryl.  The ky and sterile dressing was applied.   The patient was allowed to wake up and taken to recovery room in stable condition at the end of the case.  The family was notified at the end of the case.  The total wounds equalled the size of the Acell as the full extent of the sheets were used.  The RNFA assisted throughout the case.  The RNFA was essential in retraction and counter traction when needed to make the case progress smoothly.  This retraction and assistance made it possible to see the tissue plans for the procedure.  The assistance was needed for blood control, tissue re-approximation and assisted with closure of the incision site.

## 2018-10-23 NOTE — Anesthesia Procedure Notes (Signed)
Date/Time: 10/23/2018 11:45 AM Performed by: Mayer Camel, CRNA Pre-anesthesia Checklist: Patient identified, Emergency Drugs available, Patient being monitored and Timeout performed Oxygen Delivery Method: Circle system utilized Induction Type: Tracheostomy Placement Confirmation: positive ETCO2 Comments: Pre-existing trach, connected to ventilator, obturator in room, no complications.
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
## 2018-10-23 NOTE — Anesthesia Postprocedure Evaluation (Signed)
Anesthesia Post Note  Patient: Christopher [name]  Procedure(s) Performed: DEBRIDEMENT OF BUTTOCK,SCROTUM, AND LEG WOUNDS WITH PLACEMENT OF ACELL- BILATERAL 90 MIN (Bilateral Groin)     Patient location during evaluation: Other Anesthesia Type: General Level of consciousness: patient remains intubated per anesthesia plan Pain management: pain level controlled Vital Signs Assessment: post-procedure vital signs reviewed and stable Respiratory status: patient remains intubated per anesthesia plan Cardiovascular status: blood pressure returned to baseline and stable Postop Assessment: no apparent nausea or vomiting Anesthetic complications: no Comments: Patient taken directly to room in [facility], O2 at 100% via Ambu to tracheostomy with full monitoring. Patient tolerated procedure well.     Last Vitals: There were no vitals filed for this visit.  Last Pain: There were no vitals filed for this visit.               Ambrosia Wisnewski A.

## 2018-10-23 NOTE — Transfer of Care (Signed)
Immediate Anesthesia Transfer of Care Note  Patient: Christopher [name]  Procedure(s) Performed: DEBRIDEMENT OF Christopher [name], AND LEG WOUNDS WITH PLACEMENT OF ACELL- BILATERAL 90 MIN (Bilateral Groin)  [facility]  Anesthesia Type:General  Level of Consciousness: Patient remains intubated per anesthesia plan  Airway & Oxygen Therapy: Patient remains intubated per anesthesia plan and Patient placed on Ventilator (see vital sign flow sheet for setting)  Post-op Assessment: Report given to RN and Post -op Vital signs reviewed and stable  Post vital signs: Reviewed and stable  Last Vitals:  Vitals Value Taken Time  BP 87/50   Temp 36   Pulse 96   Resp    SpO2 100     Last Pain: There were no vitals filed for this visit.       Complications: No apparent anesthesia complications

## 2018-10-24 ENCOUNTER — Encounter (HOSPITAL_COMMUNITY): Payer: Self-pay | Admitting: Plastic Surgery

## 2018-10-24 LAB — CBC
HCT: 27.3 % — ABNORMAL LOW (ref 39.0–52.0)
Hemoglobin: 7.7 g/dL — ABNORMAL LOW (ref 13.0–17.0)
MCH: 32.1 pg (ref 26.0–34.0)
MCHC: 28.2 g/dL — ABNORMAL LOW (ref 30.0–36.0)
MCV: 113.8 fL — ABNORMAL HIGH (ref 80.0–100.0)
Platelets: 432 10*3/uL — ABNORMAL HIGH (ref 150–400)
RBC: 2.4 MIL/uL — ABNORMAL LOW (ref 4.22–5.81)
RDW: 16 % — ABNORMAL HIGH (ref 11.5–15.5)
WBC: 9.7 10*3/uL (ref 4.0–10.5)
nRBC: 0 % (ref 0.0–0.2)

## 2018-10-24 LAB — BASIC METABOLIC PANEL
Anion gap: 12 (ref 5–15)
BUN: 28 mg/dL — ABNORMAL HIGH (ref 6–20)
CO2: 28 mmol/L (ref 22–32)
Calcium: 9.9 mg/dL (ref 8.9–10.3)
Chloride: 117 mmol/L — ABNORMAL HIGH (ref 98–111)
Creatinine, Ser: 0.52 mg/dL — ABNORMAL LOW (ref 0.61–1.24)
GFR calc Af Amer: >60 mL/min (ref >60)
GFR calc non Af Amer: >60 mL/min (ref >60)
Glucose, Bld: 135 mg/dL — ABNORMAL HIGH (ref 70–99)
Potassium: 3.3 mmol/L — ABNORMAL LOW (ref 3.5–5.1)
Sodium: 157 mmol/L — ABNORMAL HIGH (ref 135–145)
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
## 2018-10-25 ENCOUNTER — Other Ambulatory Visit (HOSPITAL_COMMUNITY): Payer: Self-pay

## 2018-10-25 LAB — CBC
HCT: 25.5 % — ABNORMAL LOW (ref 39.0–52.0)
Hemoglobin: 7.3 g/dL — ABNORMAL LOW (ref 13.0–17.0)
MCH: 32.6 pg (ref 26.0–34.0)
MCHC: 28.6 g/dL — ABNORMAL LOW (ref 30.0–36.0)
MCV: 113.8 fL — ABNORMAL HIGH (ref 80.0–100.0)
Platelets: 379 10*3/uL (ref 150–400)
RBC: 2.24 MIL/uL — ABNORMAL LOW (ref 4.22–5.81)
RDW: 16.2 % — ABNORMAL HIGH (ref 11.5–15.5)
WBC: 9.1 10*3/uL (ref 4.0–10.5)
nRBC: 0 % (ref 0.0–0.2)

## 2018-10-25 LAB — BASIC METABOLIC PANEL
Anion gap: 8 (ref 5–15)
BUN: 26 mg/dL — ABNORMAL HIGH (ref 6–20)
CO2: 27 mmol/L (ref 22–32)
Calcium: 9.7 mg/dL (ref 8.9–10.3)
Chloride: 118 mmol/L — ABNORMAL HIGH (ref 98–111)
Creatinine, Ser: 0.46 mg/dL — ABNORMAL LOW (ref 0.61–1.24)
GFR calc Af Amer: >60 mL/min (ref >60)
GFR calc non Af Amer: >60 mL/min (ref >60)
Glucose, Bld: 153 mg/dL — ABNORMAL HIGH (ref 70–99)
Potassium: 3.3 mmol/L — ABNORMAL LOW (ref 3.5–5.1)
Sodium: 153 mmol/L — ABNORMAL HIGH (ref 135–145)
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
## 2018-10-26 LAB — NOVEL CORONAVIRUS, NAA (HOSP ORDER, SEND-OUT TO REF LAB; TAT 18-24 HRS): SARS-CoV-2, NAA: NOT DETECTED
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
## 2018-10-27 LAB — CBC
HCT: 26.9 % — ABNORMAL LOW (ref 39.0–52.0)
Hemoglobin: 7.8 g/dL — ABNORMAL LOW (ref 13.0–17.0)
MCH: 32.6 pg (ref 26.0–34.0)
MCHC: 29 g/dL — ABNORMAL LOW (ref 30.0–36.0)
MCV: 112.6 fL — ABNORMAL HIGH (ref 80.0–100.0)
Platelets: 359 10*3/uL (ref 150–400)
RBC: 2.39 MIL/uL — ABNORMAL LOW (ref 4.22–5.81)
RDW: 15.4 % (ref 11.5–15.5)
WBC: 7.5 10*3/uL (ref 4.0–10.5)
nRBC: 0 % (ref 0.0–0.2)

## 2018-10-27 LAB — BASIC METABOLIC PANEL
Anion gap: 9 (ref 5–15)
BUN: 34 mg/dL — ABNORMAL HIGH (ref 6–20)
CO2: 27 mmol/L (ref 22–32)
Calcium: 9.9 mg/dL (ref 8.9–10.3)
Chloride: 116 mmol/L — ABNORMAL HIGH (ref 98–111)
Creatinine, Ser: 0.51 mg/dL — ABNORMAL LOW (ref 0.61–1.24)
GFR calc Af Amer: >60 mL/min (ref >60)
GFR calc non Af Amer: >60 mL/min (ref >60)
Glucose, Bld: 148 mg/dL — ABNORMAL HIGH (ref 70–99)
Potassium: 3.4 mmol/L — ABNORMAL LOW (ref 3.5–5.1)
Sodium: 152 mmol/L — ABNORMAL HIGH (ref 135–145)

## 2018-10-27 LAB — TYPE AND SCREEN
ABO/RH(D): O POS
Antibody Screen: NEGATIVE
Unit division: 0
Unit division: 0

## 2018-10-27 LAB — BPAM RBC
Blood Product Expiration Date: 202005272359
Blood Product Expiration Date: 202005272359
ISSUE DATE / TIME: 202005121205
ISSUE DATE / TIME: 202005121205
Unit Type and Rh: 5100
Unit Type and Rh: 5100
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
## 2018-10-28 LAB — CULTURE, RESPIRATORY W GRAM STAIN

## 2018-10-28 LAB — BASIC METABOLIC PANEL
Anion gap: 8 (ref 5–15)
BUN: 29 mg/dL — ABNORMAL HIGH (ref 6–20)
CO2: 28 mmol/L (ref 22–32)
Calcium: 9.5 mg/dL (ref 8.9–10.3)
Chloride: 111 mmol/L (ref 98–111)
Creatinine, Ser: 0.46 mg/dL — ABNORMAL LOW (ref 0.61–1.24)
GFR calc Af Amer: >60 mL/min (ref >60)
GFR calc non Af Amer: >60 mL/min (ref >60)
Glucose, Bld: 118 mg/dL — ABNORMAL HIGH (ref 70–99)
Potassium: 3.4 mmol/L — ABNORMAL LOW (ref 3.5–5.1)
Sodium: 147 mmol/L — ABNORMAL HIGH (ref 135–145)
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
## 2018-10-30 LAB — CULTURE, BLOOD (ROUTINE X 2)
Culture: NO GROWTH
Culture: NO GROWTH
Special Requests: ADEQUATE
Special Requests: ADEQUATE

## 2018-10-30 LAB — URINALYSIS, ROUTINE W REFLEX MICROSCOPIC
Bilirubin Urine: NEGATIVE
Glucose, UA: NEGATIVE mg/dL
Hgb urine dipstick: NEGATIVE
Ketones, ur: NEGATIVE mg/dL
Nitrite: NEGATIVE
Protein, ur: NEGATIVE mg/dL
Specific Gravity, Urine: 1.017 (ref 1.005–1.030)
pH: 8 (ref 5.0–8.0)

## 2018-10-30 LAB — BASIC METABOLIC PANEL
Anion gap: 11 (ref 5–15)
BUN: 34 mg/dL — ABNORMAL HIGH (ref 6–20)
CO2: 27 mmol/L (ref 22–32)
Calcium: 9.8 mg/dL (ref 8.9–10.3)
Chloride: 112 mmol/L — ABNORMAL HIGH (ref 98–111)
Creatinine, Ser: 0.52 mg/dL — ABNORMAL LOW (ref 0.61–1.24)
GFR calc Af Amer: >60 mL/min (ref >60)
GFR calc non Af Amer: >60 mL/min (ref >60)
Glucose, Bld: 116 mg/dL — ABNORMAL HIGH (ref 70–99)
Potassium: 3.7 mmol/L (ref 3.5–5.1)
Sodium: 150 mmol/L — ABNORMAL HIGH (ref 135–145)
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
## 2018-10-31 ENCOUNTER — Other Ambulatory Visit (HOSPITAL_COMMUNITY): Payer: Self-pay

## 2018-10-31 LAB — URINE CULTURE
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
## 2018-11-02 ENCOUNTER — Telehealth: Payer: Self-pay

## 2018-11-02 LAB — URINALYSIS, ROUTINE W REFLEX MICROSCOPIC
Bilirubin Urine: NEGATIVE
Glucose, UA: NEGATIVE mg/dL
Hgb urine dipstick: NEGATIVE
Ketones, ur: NEGATIVE mg/dL
Leukocytes,Ua: NEGATIVE
Nitrite: NEGATIVE
Protein, ur: NEGATIVE mg/dL
Specific Gravity, Urine: 1.011 (ref 1.005–1.030)
pH: 8 (ref 5.0–8.0)

## 2018-11-02 NOTE — Telephone Encounter (Signed)
Call to Phill Mutter, RN- wound Nurse at [facility] @ [facility]: Per RN pt's wounds on posterior buttocks, sacral area are healing well- she reports that there is a 3x2cm necrotic area noted on right gluteal area- with minimal serous drainage, no bleeding & no foul odor She did consult with the attending MD- for a possible order to change the dressing - xeroform & KY gel to 3x/week v/s daily- due to the fact that she thought the wound was staying too moist- he approved that plan of care I informed her that I would relay this info to Dr. Ulice Bold, & that we would see him in the unit next week [facility]
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
## 2018-11-03 ENCOUNTER — Other Ambulatory Visit (HOSPITAL_COMMUNITY): Payer: Self-pay

## 2018-11-03 LAB — URINALYSIS, ROUTINE W REFLEX MICROSCOPIC
Bilirubin Urine: NEGATIVE
Glucose, UA: NEGATIVE mg/dL
Hgb urine dipstick: NEGATIVE
Ketones, ur: NEGATIVE mg/dL
Leukocytes,Ua: NEGATIVE
Nitrite: NEGATIVE
Protein, ur: NEGATIVE mg/dL
Specific Gravity, Urine: 1.011 (ref 1.005–1.030)
pH: 7 (ref 5.0–8.0)

## 2018-11-03 LAB — BASIC METABOLIC PANEL
Anion gap: 12 (ref 5–15)
BUN: 16 mg/dL (ref 6–20)
CO2: 25 mmol/L (ref 22–32)
Calcium: 10.4 mg/dL — ABNORMAL HIGH (ref 8.9–10.3)
Chloride: 107 mmol/L (ref 98–111)
Creatinine, Ser: 0.31 mg/dL — ABNORMAL LOW (ref 0.61–1.24)
GFR calc Af Amer: >60 mL/min (ref >60)
GFR calc non Af Amer: >60 mL/min (ref >60)
Glucose, Bld: 122 mg/dL — ABNORMAL HIGH (ref 70–99)
Potassium: 2.9 mmol/L — ABNORMAL LOW (ref 3.5–5.1)
Sodium: 144 mmol/L (ref 135–145)

## 2018-11-03 LAB — CBC
HCT: 28.6 % — ABNORMAL LOW (ref 39.0–52.0)
Hemoglobin: 8.6 g/dL — ABNORMAL LOW (ref 13.0–17.0)
MCH: 32.7 pg (ref 26.0–34.0)
MCHC: 30.1 g/dL (ref 30.0–36.0)
MCV: 108.7 fL — ABNORMAL HIGH (ref 80.0–100.0)
Platelets: 350 10*3/uL (ref 150–400)
RBC: 2.63 MIL/uL — ABNORMAL LOW (ref 4.22–5.81)
RDW: 14.9 % (ref 11.5–15.5)
WBC: 6.4 10*3/uL (ref 4.0–10.5)
nRBC: 0 % (ref 0.0–0.2)

## 2018-11-03 LAB — URINE CULTURE: Culture: NO GROWTH

## 2018-11-03 LAB — CULTURE, BLOOD (ROUTINE X 2)
Culture: NO GROWTH
Culture: NO GROWTH
Special Requests: ADEQUATE
Special Requests: ADEQUATE

## 2018-11-03 LAB — POTASSIUM: Potassium: 3.3 mmol/L — ABNORMAL LOW (ref 3.5–5.1)
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
## 2018-11-04 LAB — BASIC METABOLIC PANEL
Anion gap: 8 (ref 5–15)
BUN: 12 mg/dL (ref 6–20)
CO2: 28 mmol/L (ref 22–32)
Calcium: 10.2 mg/dL (ref 8.9–10.3)
Chloride: 108 mmol/L (ref 98–111)
Creatinine, Ser: 0.35 mg/dL — ABNORMAL LOW (ref 0.61–1.24)
GFR calc Af Amer: >60 mL/min (ref >60)
GFR calc non Af Amer: >60 mL/min (ref >60)
Glucose, Bld: 95 mg/dL (ref 70–99)
Potassium: 4 mmol/L (ref 3.5–5.1)
Sodium: 144 mmol/L (ref 135–145)

## 2018-11-04 LAB — URINE CULTURE: Culture: NO GROWTH
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
## 2018-11-07 LAB — CULTURE, RESPIRATORY W GRAM STAIN: Culture: NORMAL
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
## 2018-11-10 LAB — CBC
HCT: 28.2 % — ABNORMAL LOW (ref 39.0–52.0)
Hemoglobin: 8.6 g/dL — ABNORMAL LOW (ref 13.0–17.0)
MCH: 32.6 pg (ref 26.0–34.0)
MCHC: 30.5 g/dL (ref 30.0–36.0)
MCV: 106.8 fL — ABNORMAL HIGH (ref 80.0–100.0)
Platelets: 356 10*3/uL (ref 150–400)
RBC: 2.64 MIL/uL — ABNORMAL LOW (ref 4.22–5.81)
RDW: 14.1 % (ref 11.5–15.5)
WBC: 5.6 10*3/uL (ref 4.0–10.5)
nRBC: 0 % (ref 0.0–0.2)

## 2018-11-10 LAB — BASIC METABOLIC PANEL
Anion gap: 13 (ref 5–15)
BUN: 8 mg/dL (ref 6–20)
CO2: 27 mmol/L (ref 22–32)
Calcium: 10.1 mg/dL (ref 8.9–10.3)
Chloride: 100 mmol/L (ref 98–111)
Creatinine, Ser: 0.33 mg/dL — ABNORMAL LOW (ref 0.61–1.24)
GFR calc Af Amer: >60 mL/min (ref >60)
GFR calc non Af Amer: >60 mL/min (ref >60)
Glucose, Bld: 97 mg/dL (ref 70–99)
Potassium: 2.7 mmol/L — CL (ref 3.5–5.1)
Sodium: 140 mmol/L (ref 135–145)
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
## 2018-11-11 LAB — BASIC METABOLIC PANEL
Anion gap: 8 (ref 5–15)
BUN: 9 mg/dL (ref 6–20)
CO2: 30 mmol/L (ref 22–32)
Calcium: 9.6 mg/dL (ref 8.9–10.3)
Chloride: 100 mmol/L (ref 98–111)
Creatinine, Ser: 0.36 mg/dL — ABNORMAL LOW (ref 0.61–1.24)
GFR calc Af Amer: >60 mL/min (ref >60)
GFR calc non Af Amer: >60 mL/min (ref >60)
Glucose, Bld: 107 mg/dL — ABNORMAL HIGH (ref 70–99)
Potassium: 4.4 mmol/L (ref 3.5–5.1)
Sodium: 138 mmol/L (ref 135–145)
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
## 2018-11-12 ENCOUNTER — Other Ambulatory Visit (HOSPITAL_COMMUNITY): Payer: Self-pay

## 2018-11-12 LAB — BASIC METABOLIC PANEL
Anion gap: 7 (ref 5–15)
BUN: 8 mg/dL (ref 6–20)
CO2: 29 mmol/L (ref 22–32)
Calcium: 9.1 mg/dL (ref 8.9–10.3)
Chloride: 99 mmol/L (ref 98–111)
Creatinine, Ser: 0.31 mg/dL — ABNORMAL LOW (ref 0.61–1.24)
GFR calc Af Amer: >60 mL/min (ref >60)
GFR calc non Af Amer: >60 mL/min (ref >60)
Glucose, Bld: 103 mg/dL — ABNORMAL HIGH (ref 70–99)
Potassium: 3.1 mmol/L — ABNORMAL LOW (ref 3.5–5.1)
Sodium: 135 mmol/L (ref 135–145)
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
## 2018-11-15 LAB — CBC
HCT: 29.9 % — ABNORMAL LOW (ref 39.0–52.0)
Hemoglobin: 9 g/dL — ABNORMAL LOW (ref 13.0–17.0)
MCH: 32.6 pg (ref 26.0–34.0)
MCHC: 30.1 g/dL (ref 30.0–36.0)
MCV: 108.3 fL — ABNORMAL HIGH (ref 80.0–100.0)
Platelets: 366 10*3/uL (ref 150–400)
RBC: 2.76 MIL/uL — ABNORMAL LOW (ref 4.22–5.81)
RDW: 13.6 % (ref 11.5–15.5)
WBC: 9.8 10*3/uL (ref 4.0–10.5)
nRBC: 0 % (ref 0.0–0.2)

## 2018-11-15 LAB — BASIC METABOLIC PANEL
Anion gap: 7 (ref 5–15)
BUN: 15 mg/dL (ref 6–20)
CO2: 31 mmol/L (ref 22–32)
Calcium: 10.4 mg/dL — ABNORMAL HIGH (ref 8.9–10.3)
Chloride: 105 mmol/L (ref 98–111)
Creatinine, Ser: 0.41 mg/dL — ABNORMAL LOW (ref 0.61–1.24)
GFR calc Af Amer: >60 mL/min (ref >60)
GFR calc non Af Amer: >60 mL/min (ref >60)
Glucose, Bld: 127 mg/dL — ABNORMAL HIGH (ref 70–99)
Potassium: 3.5 mmol/L (ref 3.5–5.1)
Sodium: 143 mmol/L (ref 135–145)
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
## 2018-11-18 LAB — URINALYSIS, ROUTINE W REFLEX MICROSCOPIC
Bilirubin Urine: NEGATIVE
Glucose, UA: NEGATIVE mg/dL
Hgb urine dipstick: NEGATIVE
Ketones, ur: NEGATIVE mg/dL
Nitrite: NEGATIVE
Protein, ur: NEGATIVE mg/dL
Specific Gravity, Urine: 1.012 (ref 1.005–1.030)
WBC, UA: >50 WBC/hpf — ABNORMAL HIGH (ref 0–5)
pH: 7 (ref 5.0–8.0)
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
## 2018-11-19 ENCOUNTER — Other Ambulatory Visit (HOSPITAL_COMMUNITY): Payer: Self-pay

## 2018-11-19 LAB — CBC
HCT: 28.4 % — ABNORMAL LOW (ref 39.0–52.0)
Hemoglobin: 8.2 g/dL — ABNORMAL LOW (ref 13.0–17.0)
MCH: 32.4 pg (ref 26.0–34.0)
MCHC: 28.9 g/dL — ABNORMAL LOW (ref 30.0–36.0)
MCV: 112.3 fL — ABNORMAL HIGH (ref 80.0–100.0)
Platelets: 345 10*3/uL (ref 150–400)
RBC: 2.53 MIL/uL — ABNORMAL LOW (ref 4.22–5.81)
RDW: 13.6 % (ref 11.5–15.5)
WBC: 12.4 10*3/uL — ABNORMAL HIGH (ref 4.0–10.5)
nRBC: 0 % (ref 0.0–0.2)

## 2018-11-19 LAB — BASIC METABOLIC PANEL
Anion gap: 10 (ref 5–15)
BUN: 24 mg/dL — ABNORMAL HIGH (ref 6–20)
CO2: 30 mmol/L (ref 22–32)
Calcium: 10.5 mg/dL — ABNORMAL HIGH (ref 8.9–10.3)
Chloride: 111 mmol/L (ref 98–111)
Creatinine, Ser: 0.58 mg/dL — ABNORMAL LOW (ref 0.61–1.24)
GFR calc Af Amer: >60 mL/min (ref >60)
GFR calc non Af Amer: >60 mL/min (ref >60)
Glucose, Bld: 137 mg/dL — ABNORMAL HIGH (ref 70–99)
Potassium: 3.3 mmol/L — ABNORMAL LOW (ref 3.5–5.1)
Sodium: 151 mmol/L — ABNORMAL HIGH (ref 135–145)

## 2018-11-19 LAB — GRAM STAIN

## 2018-11-20 LAB — URINE CULTURE: Culture: >=100000 — AB
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
## 2018-11-22 LAB — BASIC METABOLIC PANEL
Anion gap: 8 (ref 5–15)
BUN: 18 mg/dL (ref 6–20)
CO2: 28 mmol/L (ref 22–32)
Calcium: 9.4 mg/dL (ref 8.9–10.3)
Chloride: 110 mmol/L (ref 98–111)
Creatinine, Ser: 0.39 mg/dL — ABNORMAL LOW (ref 0.61–1.24)
GFR calc Af Amer: >60 mL/min (ref >60)
GFR calc non Af Amer: >60 mL/min (ref >60)
Glucose, Bld: 97 mg/dL (ref 70–99)
Potassium: 3.2 mmol/L — ABNORMAL LOW (ref 3.5–5.1)
Sodium: 146 mmol/L — ABNORMAL HIGH (ref 135–145)

## 2018-11-22 LAB — CBC
HCT: 27.7 % — ABNORMAL LOW (ref 39.0–52.0)
Hemoglobin: 8.1 g/dL — ABNORMAL LOW (ref 13.0–17.0)
MCH: 32.3 pg (ref 26.0–34.0)
MCHC: 29.2 g/dL — ABNORMAL LOW (ref 30.0–36.0)
MCV: 110.4 fL — ABNORMAL HIGH (ref 80.0–100.0)
Platelets: 355 10*3/uL (ref 150–400)
RBC: 2.51 MIL/uL — ABNORMAL LOW (ref 4.22–5.81)
RDW: 12.6 % (ref 11.5–15.5)
WBC: 7 10*3/uL (ref 4.0–10.5)
nRBC: 0 % (ref 0.0–0.2)
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
## 2018-11-23 ENCOUNTER — Other Ambulatory Visit: Payer: Self-pay

## 2018-11-23 ENCOUNTER — Encounter: Payer: Self-pay | Admitting: Physical Medicine and Rehabilitation

## 2018-11-23 ENCOUNTER — Inpatient Hospital Stay (HOSPITAL_COMMUNITY)
Admission: RE | Admit: 2018-11-23 | Discharge: 2018-12-14 | DRG: 559 | Disposition: A | Discharge status: Home Health | Payer: No Typology Code available for payment source | Source: Intra-hospital | Attending: Physical Medicine & Rehabilitation | Admitting: Physical Medicine & Rehabilitation

## 2018-11-23 DIAGNOSIS — L89159 Pressure ulcer of sacral region, unspecified stage: Secondary | ICD-10-CM | POA: Diagnosis present

## 2018-11-23 DIAGNOSIS — L24A9 Irritant contact dermatitis due friction or contact with other specified body fluids: Secondary | ICD-10-CM

## 2018-11-23 DIAGNOSIS — Z933 Colostomy status: Secondary | ICD-10-CM | POA: Diagnosis not present

## 2018-11-23 DIAGNOSIS — Z87891 Personal history of nicotine dependence: Secondary | ICD-10-CM

## 2018-11-23 DIAGNOSIS — K3184 Gastroparesis: Secondary | ICD-10-CM | POA: Diagnosis present

## 2018-11-23 DIAGNOSIS — F419 Anxiety disorder, unspecified: Secondary | ICD-10-CM

## 2018-11-23 DIAGNOSIS — Z79891 Long term (current) use of opiate analgesic: Secondary | ICD-10-CM | POA: Diagnosis not present

## 2018-11-23 DIAGNOSIS — T79A21D Traumatic compartment syndrome of right lower extremity, subsequent encounter: Secondary | ICD-10-CM | POA: Diagnosis not present

## 2018-11-23 DIAGNOSIS — B952 Enterococcus as the cause of diseases classified elsewhere: Secondary | ICD-10-CM | POA: Diagnosis present

## 2018-11-23 DIAGNOSIS — S32811D Multiple fractures of pelvis with unstable disruption of pelvic ring, subsequent encounter for fracture with routine healing: Secondary | ICD-10-CM | POA: Diagnosis present

## 2018-11-23 DIAGNOSIS — E43 Unspecified severe protein-calorie malnutrition: Secondary | ICD-10-CM | POA: Diagnosis present

## 2018-11-23 DIAGNOSIS — R6510 Systemic inflammatory response syndrome (SIRS) of non-infectious origin without acute organ dysfunction: Secondary | ICD-10-CM

## 2018-11-23 DIAGNOSIS — F339 Major depressive disorder, recurrent, unspecified: Secondary | ICD-10-CM

## 2018-11-23 DIAGNOSIS — G9341 Metabolic encephalopathy: Secondary | ICD-10-CM

## 2018-11-23 DIAGNOSIS — S3730XS Unspecified injury of urethra, sequela: Secondary | ICD-10-CM | POA: Diagnosis not present

## 2018-11-23 DIAGNOSIS — J9621 Acute and chronic respiratory failure with hypoxia: Secondary | ICD-10-CM | POA: Diagnosis present

## 2018-11-23 DIAGNOSIS — X58XXXA Exposure to other specified factors, initial encounter: Secondary | ICD-10-CM | POA: Diagnosis not present

## 2018-11-23 DIAGNOSIS — S7402XD Injury of sciatic nerve at hip and thigh level, left leg, subsequent encounter: Secondary | ICD-10-CM | POA: Diagnosis not present

## 2018-11-23 DIAGNOSIS — R1310 Dysphagia, unspecified: Secondary | ICD-10-CM | POA: Diagnosis present

## 2018-11-23 DIAGNOSIS — S72002D Fracture of unspecified part of neck of left femur, subsequent encounter for closed fracture with routine healing: Secondary | ICD-10-CM

## 2018-11-23 DIAGNOSIS — S0450XD Injury of facial nerve, unspecified side, subsequent encounter: Secondary | ICD-10-CM | POA: Diagnosis not present

## 2018-11-23 DIAGNOSIS — R000 Tachycardia, unspecified: Secondary | ICD-10-CM | POA: Diagnosis present

## 2018-11-23 DIAGNOSIS — R1312 Dysphagia, oropharyngeal phase: Secondary | ICD-10-CM | POA: Diagnosis not present

## 2018-11-23 DIAGNOSIS — R52 Pain, unspecified: Secondary | ICD-10-CM | POA: Diagnosis not present

## 2018-11-23 DIAGNOSIS — N390 Urinary tract infection, site not specified: Secondary | ICD-10-CM | POA: Diagnosis present

## 2018-11-23 DIAGNOSIS — E870 Hyperosmolality and hypernatremia: Secondary | ICD-10-CM | POA: Diagnosis not present

## 2018-11-23 DIAGNOSIS — F329 Major depressive disorder, single episode, unspecified: Secondary | ICD-10-CM

## 2018-11-23 DIAGNOSIS — Z885 Allergy status to narcotic agent status: Secondary | ICD-10-CM

## 2018-11-23 DIAGNOSIS — S0001XA Abrasion of scalp, initial encounter: Secondary | ICD-10-CM | POA: Diagnosis not present

## 2018-11-23 DIAGNOSIS — Z931 Gastrostomy status: Secondary | ICD-10-CM

## 2018-11-23 DIAGNOSIS — S32811B Multiple fractures of pelvis with unstable disruption of pelvic ring, initial encounter for open fracture: Secondary | ICD-10-CM | POA: Diagnosis present

## 2018-11-23 DIAGNOSIS — F32A Depression, unspecified: Secondary | ICD-10-CM

## 2018-11-23 DIAGNOSIS — G894 Chronic pain syndrome: Secondary | ICD-10-CM | POA: Diagnosis present

## 2018-11-23 DIAGNOSIS — Z791 Long term (current) use of non-steroidal anti-inflammatories (NSAID): Secondary | ICD-10-CM

## 2018-11-23 DIAGNOSIS — T148XXA Other injury of unspecified body region, initial encounter: Secondary | ICD-10-CM

## 2018-11-23 DIAGNOSIS — G9340 Encephalopathy, unspecified: Secondary | ICD-10-CM | POA: Diagnosis present

## 2018-11-23 DIAGNOSIS — D638 Anemia in other chronic diseases classified elsewhere: Secondary | ICD-10-CM | POA: Diagnosis present

## 2018-11-23 DIAGNOSIS — R531 Weakness: Secondary | ICD-10-CM | POA: Diagnosis present

## 2018-11-23 DIAGNOSIS — D62 Acute posthemorrhagic anemia: Secondary | ICD-10-CM | POA: Diagnosis present

## 2018-11-23 DIAGNOSIS — D539 Nutritional anemia, unspecified: Secondary | ICD-10-CM | POA: Diagnosis present

## 2018-11-23 DIAGNOSIS — Y92239 Unspecified place in hospital as the place of occurrence of the external cause: Secondary | ICD-10-CM | POA: Diagnosis not present

## 2018-11-23 DIAGNOSIS — Z794 Long term (current) use of insulin: Secondary | ICD-10-CM

## 2018-11-23 DIAGNOSIS — E876 Hypokalemia: Secondary | ICD-10-CM

## 2018-11-23 DIAGNOSIS — Z433 Encounter for attention to colostomy: Secondary | ICD-10-CM

## 2018-11-23 DIAGNOSIS — R05 Cough: Secondary | ICD-10-CM | POA: Diagnosis present

## 2018-11-23 DIAGNOSIS — Z79899 Other long term (current) drug therapy: Secondary | ICD-10-CM | POA: Diagnosis not present

## 2018-11-23 DIAGNOSIS — Z431 Encounter for attention to gastrostomy: Secondary | ICD-10-CM | POA: Diagnosis not present

## 2018-11-23 DIAGNOSIS — F411 Generalized anxiety disorder: Secondary | ICD-10-CM | POA: Diagnosis not present

## 2018-11-23 DIAGNOSIS — R5381 Other malaise: Secondary | ICD-10-CM | POA: Diagnosis not present

## 2018-11-23 DIAGNOSIS — Z6820 Body mass index (BMI) 20.0-20.9, adult: Secondary | ICD-10-CM

## 2018-11-23 DIAGNOSIS — E46 Unspecified protein-calorie malnutrition: Secondary | ICD-10-CM

## 2018-11-23 DIAGNOSIS — S32810S Multiple fractures of pelvis with stable disruption of pelvic ring, sequela: Secondary | ICD-10-CM | POA: Diagnosis not present

## 2018-11-23 LAB — NOVEL CORONAVIRUS, NAA (HOSP ORDER, SEND-OUT TO REF LAB; TAT 18-24 HRS): SARS-CoV-2, NAA: NOT DETECTED

## 2018-11-23 LAB — GLUCOSE, CAPILLARY
Glucose-Capillary: 108 mg/dL — ABNORMAL HIGH (ref 70–99)
Glucose-Capillary: 116 mg/dL — ABNORMAL HIGH (ref 70–99)
Glucose-Capillary: 126 mg/dL — ABNORMAL HIGH (ref 70–99)

## 2018-11-23 MED ORDER — CLONAZEPAM 0.5 MG PO TABS
0.5000 mg | ORAL_TABLET | Freq: Every day | ORAL | Status: DC
  Administered 2018-11-24 – 2018-12-06 (×13): 0.5 mg
  Filled 2018-11-23 (×13): qty 1

## 2018-11-23 MED ORDER — MODAFINIL 100 MG PO TABS
100.0000 mg | ORAL_TABLET | Freq: Every day | ORAL | Status: DC
  Administered 2018-11-24 – 2018-11-29 (×6): 100 mg
  Filled 2018-11-23 (×6): qty 1

## 2018-11-23 MED ORDER — CLONAZEPAM 0.5 MG PO TABS
0.5000 mg | ORAL_TABLET | Freq: Three times a day (TID) | ORAL | Status: DC | PRN
  Administered 2018-11-27: 0.5 mg
  Filled 2018-11-23: qty 1

## 2018-11-23 MED ORDER — PROCHLORPERAZINE 25 MG RE SUPP: 12.5000 mg | Freq: Four times a day (QID) | RECTAL | Status: DC | PRN

## 2018-11-23 MED ORDER — HYDROCERIN EX CREA
TOPICAL_CREAM | Freq: Two times a day (BID) | CUTANEOUS | Status: DC
  Administered 2018-11-23: 21:00:00 via TOPICAL
  Administered 2018-11-24: 1 via TOPICAL
  Administered 2018-11-24: 09:00:00 via TOPICAL
  Administered 2018-11-25: 1 via TOPICAL
  Administered 2018-11-25 – 2018-11-28 (×6): via TOPICAL
  Administered 2018-11-28: 1 via TOPICAL
  Administered 2018-11-29 – 2018-11-30 (×3): via TOPICAL
  Administered 2018-11-30: 1 via TOPICAL
  Administered 2018-12-01 – 2018-12-07 (×13): via TOPICAL
  Administered 2018-12-07 – 2018-12-08 (×2): 1 via TOPICAL
  Administered 2018-12-08 – 2018-12-09 (×2): via TOPICAL
  Administered 2018-12-09: 1 via TOPICAL
  Filled 2018-11-23: qty 113

## 2018-11-23 MED ORDER — VITAL 1.5 CAL PO LIQD
1000.0000 mL | ORAL | Status: DC
  Administered 2018-11-25 – 2018-11-26 (×3): 1000 mL
  Filled 2018-11-23 (×7): qty 1000

## 2018-11-23 MED ORDER — QUETIAPINE FUMARATE 50 MG PO TABS
200.0000 mg | ORAL_TABLET | Freq: Three times a day (TID) | ORAL | Status: DC
  Administered 2018-11-23 – 2018-11-26 (×8): 200 mg
  Filled 2018-11-23: qty 8
  Filled 2018-11-23 (×6): qty 4
  Filled 2018-11-23: qty 8

## 2018-11-23 MED ORDER — FLEET ENEMA 7-19 GM/118ML RE ENEM: 1.0000 | ENEMA | Freq: Once | RECTAL | Status: DC | PRN

## 2018-11-23 MED ORDER — DIPHENHYDRAMINE HCL 12.5 MG/5ML PO ELIX
12.5000 mg | ORAL_SOLUTION | Freq: Four times a day (QID) | ORAL | Status: DC | PRN
  Administered 2018-11-29: 25 mg
  Filled 2018-11-23: qty 10

## 2018-11-23 MED ORDER — FENTANYL 50 MCG/HR TD PT72
1.0000 | MEDICATED_PATCH | TRANSDERMAL | Status: DC
  Administered 2018-11-23 – 2018-12-11 (×7): 1 via TRANSDERMAL
  Filled 2018-11-23 (×7): qty 1

## 2018-11-23 MED ORDER — POTASSIUM CHLORIDE 20 MEQ PO PACK
20.0000 meq | PACK | Freq: Every day | ORAL | Status: DC
  Administered 2018-11-23 – 2018-11-27 (×5): 20 meq
  Filled 2018-11-23 (×5): qty 1

## 2018-11-23 MED ORDER — BISACODYL 10 MG RE SUPP: 10.0000 mg | Freq: Every day | RECTAL | Status: DC | PRN

## 2018-11-23 MED ORDER — ADULT MULTIVITAMIN LIQUID CH
15.0000 mL | Freq: Every day | ORAL | Status: DC
  Administered 2018-11-24 – 2018-12-06 (×13): 15 mL
  Filled 2018-11-23 (×13): qty 15

## 2018-11-23 MED ORDER — CLONIDINE HCL 0.1 MG PO TABS
0.1000 mg | ORAL_TABLET | Freq: Two times a day (BID) | ORAL | Status: DC
  Administered 2018-11-23 – 2018-12-06 (×26): 0.1 mg
  Filled 2018-11-23 (×26): qty 1

## 2018-11-23 MED ORDER — METOCLOPRAMIDE HCL 5 MG/5ML PO SOLN
5.0000 mg | Freq: Three times a day (TID) | ORAL | Status: DC
  Administered 2018-11-23 – 2018-12-06 (×51): 5 mg
  Filled 2018-11-23 (×9): qty 10
  Filled 2018-11-23 (×2): qty 5
  Filled 2018-11-23 (×44): qty 10

## 2018-11-23 MED ORDER — METOPROLOL TARTRATE 25 MG/10 ML ORAL SUSPENSION
50.0000 mg | Freq: Three times a day (TID) | ORAL | Status: DC
  Administered 2018-11-23 – 2018-12-06 (×39): 50 mg via ORAL
  Filled 2018-11-23 (×41): qty 20

## 2018-11-23 MED ORDER — MELATONIN 3 MG PO TABS
3.0000 mg | ORAL_TABLET | Freq: Every day | ORAL | Status: DC
  Administered 2018-11-23 – 2018-12-05 (×13): 3 mg
  Filled 2018-11-23 (×13): qty 1

## 2018-11-23 MED ORDER — ACETAMINOPHEN 325 MG PO TABS
325.0000 mg | ORAL_TABLET | ORAL | Status: DC | PRN
  Administered 2018-11-26 – 2018-12-05 (×5): 650 mg
  Filled 2018-11-23 (×5): qty 2

## 2018-11-23 MED ORDER — FREE WATER
50.0000 mL | Status: DC
  Administered 2018-11-23 – 2018-11-26 (×66): 50 mL

## 2018-11-23 MED ORDER — PROCHLORPERAZINE MALEATE 5 MG PO TABS: 5.0000 mg | ORAL_TABLET | Freq: Four times a day (QID) | ORAL | Status: DC | PRN

## 2018-11-23 MED ORDER — INSULIN ASPART 100 UNIT/ML ~~LOC~~ SOLN
0.0000 [IU] | SUBCUTANEOUS | Status: DC
  Administered 2018-11-24 – 2018-12-03 (×11): 1 [IU] via SUBCUTANEOUS

## 2018-11-23 MED ORDER — POLYETHYLENE GLYCOL 3350 17 G PO PACK: 17.0000 g | PACK | Freq: Every day | ORAL | Status: DC | PRN

## 2018-11-23 MED ORDER — ALUM & MAG HYDROXIDE-SIMETH 200-200-20 MG/5ML PO SUSP: 30.0000 mL | ORAL | Status: DC | PRN

## 2018-11-23 MED ORDER — ENOXAPARIN SODIUM 40 MG/0.4ML ~~LOC~~ SOLN
40.0000 mg | SUBCUTANEOUS | Status: DC
  Administered 2018-11-24 – 2018-12-14 (×21): 40 mg via SUBCUTANEOUS
  Filled 2018-11-23 (×21): qty 0.4

## 2018-11-23 MED ORDER — PRO-STAT SUGAR FREE PO LIQD: 30.0000 mL | Freq: Two times a day (BID) | ORAL | Status: DC

## 2018-11-23 MED ORDER — GABAPENTIN 300 MG/6ML PO SOLN
300.0000 mg | Freq: Three times a day (TID) | ORAL | Status: DC
  Administered 2018-11-23 – 2018-12-06 (×38): 300 mg via ORAL
  Filled 2018-11-23 (×45): qty 6

## 2018-11-23 MED ORDER — JUVEN PO PACK
1.0000 | PACK | Freq: Two times a day (BID) | ORAL | Status: DC
  Administered 2018-11-23 – 2018-12-07 (×25): 1
  Filled 2018-11-23 (×38): qty 1

## 2018-11-23 MED ORDER — NON FORMULARY: 0.3000 mg | Freq: Every day | Status: DC

## 2018-11-23 MED ORDER — GUAIFENESIN-DM 100-10 MG/5ML PO SYRP
5.0000 mL | ORAL_SOLUTION | Freq: Four times a day (QID) | ORAL | Status: DC | PRN
  Administered 2018-11-27 (×2): 10 mL
  Filled 2018-11-23 (×3): qty 10

## 2018-11-23 MED ORDER — NITROFURANTOIN MACROCRYSTAL 100 MG PO CAPS
100.0000 mg | ORAL_CAPSULE | Freq: Two times a day (BID) | ORAL | Status: DC
  Filled 2018-11-23: qty 1

## 2018-11-23 MED ORDER — PROCHLORPERAZINE EDISYLATE 10 MG/2ML IJ SOLN: 5.0000 mg | Freq: Four times a day (QID) | INTRAMUSCULAR | Status: DC | PRN

## 2018-11-23 MED ORDER — PRO-STAT SUGAR FREE PO LIQD
60.0000 mL | Freq: Two times a day (BID) | ORAL | Status: DC
  Administered 2018-11-23 – 2018-11-28 (×10): 60 mL
  Filled 2018-11-23 (×10): qty 60

## 2018-11-23 MED ORDER — NITROFURANTOIN MONOHYD MACRO 100 MG PO CAPS
100.0000 mg | ORAL_CAPSULE | Freq: Two times a day (BID) | ORAL | Status: AC
  Administered 2018-11-23 – 2018-11-24 (×3): 100 mg via ORAL
  Filled 2018-11-23 (×3): qty 1

## 2018-11-23 MED ORDER — AMANTADINE HCL 50 MG/5ML PO SYRP
100.0000 mg | ORAL_SOLUTION | Freq: Every day | ORAL | Status: DC
  Administered 2018-11-24 – 2018-12-03 (×10): 100 mg
  Filled 2018-11-23 (×10): qty 10

## 2018-11-23 MED ORDER — SODIUM CHLORIDE 0.9 % IV SOLN
2.0000 g | Freq: Three times a day (TID) | INTRAVENOUS | Status: AC
  Administered 2018-11-23 – 2018-11-25 (×7): 2 g via INTRAVENOUS
  Filled 2018-11-23 (×8): qty 2

## 2018-11-23 MED ORDER — PANTOPRAZOLE SODIUM 40 MG PO PACK
40.0000 mg | PACK | Freq: Two times a day (BID) | ORAL | Status: DC
  Administered 2018-11-23 – 2018-12-06 (×26): 40 mg
  Filled 2018-11-23 (×15): qty 20

## 2018-11-23 MED ORDER — POLYETHYLENE GLYCOL 3350 17 G PO PACK
17.0000 g | PACK | Freq: Every day | ORAL | Status: DC
  Administered 2018-11-24 – 2018-12-06 (×13): 17 g
  Filled 2018-11-23 (×12): qty 1

## 2018-11-23 NOTE — Progress Notes (Signed)
Admit to unit, oriented to rehab, medications, treatments, therapy sessions and plan of care. States an understanding of information reviewed. Margarito Liner
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
## 2018-11-23 NOTE — Progress Notes (Signed)
Orthopedic Tech Progress Note Patient Details:  Christopher [name] 30-Jun-1965 [id number]  Ortho Devices Type of Ortho Device: Prafo boot/shoe Ortho Device/Splint Location: bilateral Ortho Device/Splint Interventions: Adjustment, Application, Ordered   Post Interventions Patient Tolerated: Well Instructions Provided: Care of device, Adjustment of device   Janit Pagan 11/23/2018, 6:11 PM
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
## 2018-11-23 NOTE — Progress Notes (Signed)
Initial Nutrition Assessment  DOCUMENTATION CODES:   Severe malnutrition in context of chronic illness  INTERVENTION:   Tube feeding via PEG: - Vital 1.5 @ 80 ml/hr x 20 hours (TF can be held for up to 4 hours for therapies) - Pro-stat 60 ml BID - 1 packet Juven BID - Free water per MD, currently 50 ml q 1 hour - liquid MVI  Tube feeding regimen provides 2990 kcal, 173 grams of protein, and 2422 ml of H2O (100% of needs).  Each packet of Juven contains 7 grams of L-arginine and L-glutamine, 300 mg vitamin C, 15 mg vitamin E, 1.2 mcg vitamin B-12, 9.5 mg zinc, 200 mg calcium, and 1.5 g  Calcium Beta-hydroxy-Beta-methylbutyrate to support wound healing.  - RD will follow for diet advancement and provide oral nutrition supplements as appropriate  NUTRITION DIAGNOSIS:   Severe Malnutrition related to chronic illness (trauma, wounds, multiple fractures, dysphagia) as evidenced by severe muscle depletion, severe fat depletion, percent weight loss (16.3% weight loss in 3 months).  GOAL:   Patient will meet greater than or equal to 90% of their needs  MONITOR:   Labs, I & O's, Weight trends, TF tolerance, Skin, Diet advancement  REASON FOR ASSESSMENT:   Consult Enteral/tube feeding initiation and management  ASSESSMENT:   53 year old male who presented to the ED on 07/10/18 as a level 1 trauma after he was accidentally backed over with a tractor trailer at work. Obvious open wound to left groin and scrotum. Taken to OR for hemorrhage control. Urology consulted for urethral trauma and scrotal degloving. Pt developed abdominal compartment syndrome in OR and underwent decompressive laparotomy. IR performed pelvic arteriogram and embolization of right internal iliac and SP tube placement. Hospital course with ortho consulted recommended operative fixation of pelvis when stabilized and placed in traction 1/29. Plastics consulted 1/29 for complex degloving of left groin/thigh and scrotal  degloving. To OR with trauma and orthopedic surgery 1/30 for abdominal washout and pelvic fixation. Pt underwent multiple additional OR procedures during hospitalization. On 2/10 pt had creation of diverting colostomy. On 2/20 pt had tracheostomy. On 3/3 pt had PEG placed. Pt discharged to [place] on 10/16/18 on vent via trach. Eventually weaned from vent and began capping trials on 5/28 with eventual decannulation on 6/5. Pt failed FEES on 6/1 and remains NPO. Admitted to CIR on 6/12.  RD consulted for TF initiation and management.  Reviewed weights in chart. Pt with a progressive weight loss that started on 08/21/18. Overall, pt has lost 13.1 kg since that date. This is a 16.3% weight loss which is significant for timeframe.  1/28: 72.6 kg 2/06: 79.3 kg 2/11: 80.7 kg 2/13: 79.4 kg 2/19: 80 kg 2/25: 83.5 kg 3/03: 87.2 kg 3/10: 80.2 kg 3/16: 81.1 kg 3/19: 81.2 kg 3/26: 74 kg 4/01: 70.2 kg 4/06: 65.5 kg 4/14: 65.2 kg 4/21: 64.5 kg 4/23: 64.5 kg 6/11: 67.1 kg  Verbal with readback order received per Dr. Naaman Plummer for RD to order daily weights.  Spoke with [facility] RD to reviewed pt's TF regimen while there. Per RD, pt was receiving 2 Cal HN formula at 45 ml/hr with Pro-stat 30 ml TID. This provides 2460 kcal and 91 grams of protein. Per RD, free water was being managed by MD.  Per RD at [facility], pt weighed 170 lbs on admit and weighed 147 on d/c.  Spoke with pt at bedside. Pt states he is doing well. Pt explaining photo of his son at bedside and states that  he misses him. Pt reports his stomach feels fine and that he does feel hungry. Noted TF pump in room.  Medications reviewed and include: Pro-stat 30 ml BID per tube, free water 50 ml q hour, SSI, Reglan 5 mg QID, liquid MVI, Protonix, Miralax, Klor-con 10 mEq daily, IV abx  Labs reviewed: sodium 146, potassium 3.2, hemoglobin 8.1  NUTRITION - FOCUSED PHYSICAL EXAM:    Most Recent Value  Orbital Region  Severe depletion  Upper Arm Region   Moderate depletion  Thoracic and Lumbar Region  Severe depletion  Buccal Region  Severe depletion  Temple Region  Severe depletion  Clavicle Bone Region  Severe depletion  Clavicle and Acromion Bone Region  Severe depletion  Scapular Bone Region  Unable to assess  Dorsal Hand  Moderate depletion  Patellar Region  Moderate depletion  Anterior Thigh Region  Severe depletion  Posterior Calf Region  Moderate depletion  Edema (RD Assessment)  None  Hair  Reviewed  Eyes  Reviewed  Mouth  Reviewed  Skin  Reviewed  Nails  Reviewed       Diet Order:   Diet Order            Diet NPO time specified Except for: Ice Chips  Diet effective now              EDUCATION NEEDS:   No education needs have been identified at this time  Skin:  Skin Assessment: Skin Integrity Issues: Other: chronic wounds to back and sacrum/buttock area; 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

## 2018-11-24 ENCOUNTER — Inpatient Hospital Stay (HOSPITAL_COMMUNITY): Payer: Self-pay | Admitting: Physical Therapy

## 2018-11-24 ENCOUNTER — Inpatient Hospital Stay (HOSPITAL_COMMUNITY): Payer: Self-pay

## 2018-11-24 ENCOUNTER — Inpatient Hospital Stay (HOSPITAL_COMMUNITY): Payer: Self-pay | Admitting: Occupational Therapy

## 2018-11-24 DIAGNOSIS — D539 Nutritional anemia, unspecified: Secondary | ICD-10-CM

## 2018-11-24 LAB — CBC WITH DIFFERENTIAL/PLATELET
Abs Immature Granulocytes: 0.06 10*3/uL (ref 0.00–0.07)
Basophils Absolute: 0 10*3/uL (ref 0.0–0.1)
Basophils Relative: 0 %
Eosinophils Absolute: 0.8 10*3/uL — ABNORMAL HIGH (ref 0.0–0.5)
Eosinophils Relative: 9 %
HCT: 29.8 % — ABNORMAL LOW (ref 39.0–52.0)
Hemoglobin: 8.7 g/dL — ABNORMAL LOW (ref 13.0–17.0)
Immature Granulocytes: 1 %
Lymphocytes Relative: 17 %
Lymphs Abs: 1.6 10*3/uL (ref 0.7–4.0)
MCH: 31.8 pg (ref 26.0–34.0)
MCHC: 29.2 g/dL — ABNORMAL LOW (ref 30.0–36.0)
MCV: 108.8 fL — ABNORMAL HIGH (ref 80.0–100.0)
Monocytes Absolute: 0.4 10*3/uL (ref 0.1–1.0)
Monocytes Relative: 4 %
Neutro Abs: 6.5 10*3/uL (ref 1.7–7.7)
Neutrophils Relative %: 69 %
Platelets: 457 10*3/uL — ABNORMAL HIGH (ref 150–400)
RBC: 2.74 MIL/uL — ABNORMAL LOW (ref 4.22–5.81)
RDW: 12.8 % (ref 11.5–15.5)
WBC: 9.4 10*3/uL (ref 4.0–10.5)
nRBC: 0 % (ref 0.0–0.2)

## 2018-11-24 LAB — IRON AND TIBC
Iron: 44 ug/dL — ABNORMAL LOW (ref 45–182)
Saturation Ratios: 24 % (ref 17.9–39.5)
TIBC: 181 ug/dL — ABNORMAL LOW (ref 250–450)
UIBC: 137 ug/dL

## 2018-11-24 LAB — GLUCOSE, CAPILLARY
Glucose-Capillary: 109 mg/dL — ABNORMAL HIGH (ref 70–99)
Glucose-Capillary: 138 mg/dL — ABNORMAL HIGH (ref 70–99)
Glucose-Capillary: 91 mg/dL (ref 70–99)
Glucose-Capillary: 92 mg/dL (ref 70–99)
Glucose-Capillary: 96 mg/dL (ref 70–99)

## 2018-11-24 LAB — COMPREHENSIVE METABOLIC PANEL
ALT: 70 U/L — ABNORMAL HIGH (ref 0–44)
AST: 40 U/L (ref 15–41)
Albumin: 2 g/dL — ABNORMAL LOW (ref 3.5–5.0)
Alkaline Phosphatase: 162 U/L — ABNORMAL HIGH (ref 38–126)
Anion gap: 11 (ref 5–15)
BUN: 18 mg/dL (ref 6–20)
CO2: 22 mmol/L (ref 22–32)
Calcium: 9.6 mg/dL (ref 8.9–10.3)
Chloride: 110 mmol/L (ref 98–111)
Creatinine, Ser: 0.33 mg/dL — ABNORMAL LOW (ref 0.61–1.24)
GFR calc Af Amer: >60 mL/min (ref >60)
GFR calc non Af Amer: >60 mL/min (ref >60)
Glucose, Bld: 112 mg/dL — ABNORMAL HIGH (ref 70–99)
Potassium: 3.3 mmol/L — ABNORMAL LOW (ref 3.5–5.1)
Sodium: 143 mmol/L (ref 135–145)
Total Bilirubin: 0.3 mg/dL (ref 0.3–1.2)
Total Protein: 7.5 g/dL (ref 6.5–8.1)

## 2018-11-24 LAB — FOLATE: Folate: 18.6 ng/mL (ref >5.9)

## 2018-11-24 LAB — HEMOGLOBIN A1C
Hgb A1c MFr Bld: 4.7 % — ABNORMAL LOW (ref 4.8–5.6)
Mean Plasma Glucose: 88.19 mg/dL

## 2018-11-24 NOTE — Evaluation (Signed)
Occupational Therapy Assessment and Plan  Patient Details  Name: Christopher [name] MRN: [id number] Date of Birth: [birth year]  OT Diagnosis: abnormal posture, muscular wasting and disuse atrophy and muscle weakness (generalized), pain in joint Rehab Potential: Rehab Potential (ACUTE ONLY): Fair ELOS: 2.5-3 weeks   Today's Date: 11/24/2018 OT Individual Time: 6962-9528 OT Individual Time Calculation (min): 65 min     Problem List:  Patient Active Problem List   Diagnosis Date Noted  . Debility 11/23/2018  . Acute on chronic respiratory failure with hypoxia ([place])   . Bacteremia due to Pseudomonas   . Chronic pain syndrome   . Multiple traumatic injuries   . Pressure injury of skin 08/13/2018  . Acute hemorrhage 07/13/2018  . Degloving injury of lower leg, initial encounter 07/13/2018  . Urethral injury 07/13/2018  . Scrotal injury, initial encounter 07/13/2018  . Fracture of femoral neck, left ([place]) 07/10/2018  . Multiple fractures of pelvis with unstable disruption of pelvic ring, initial encounter for open fracture ([place]) 07/10/2018    Past Medical History:  Past Medical History:  Diagnosis Date  . Acute on chronic respiratory failure with hypoxia ([place])   . Bacteremia due to Pseudomonas   . Chronic pain syndrome   . Multiple traumatic injuries    Past Surgical History:  Past Surgical History:  Procedure Laterality Date  . APPLICATION OF A-CELL OF BACK N/A 08/06/2018   Procedure: Application Of A-Cell Of Back;  Surgeon: [name], DO;  Location: [place];  Service: Plastics;  Laterality: N/A;  . APPLICATION OF A-CELL OF EXTREMITY Left 08/06/2018   Procedure: Application Of A-Cell Of Extremity;  Surgeon: [name], DO;  Location: [place];  Service: Plastics;  Laterality: Left;  . APPLICATION OF WOUND VAC  07/12/2018   Procedure: Application Of Wound Vac to the Left Thigh and Scrotum.;  Surgeon: Shona Needles, MD;  Location: [place];  Service: Orthopedics;;  .  APPLICATION OF WOUND VAC  07/10/2018   Procedure: Application Of Wound Vac;  Surgeon: Clovis Riley, MD;  Location: [place];  Service: General;;  . COLOSTOMY N/A 07/23/2018   Procedure: COLOSTOMY;  Surgeon: Georganna Skeans, MD;  Location: [place];  Service: General;  Laterality: N/A;  . CYSTOSCOPY W/ URETERAL STENT PLACEMENT N/A 07/15/2018   Procedure: RETROGRADE URETHROGRAM;  Surgeon: Franchot Gallo, MD;  Location: [place];  Service: Urology;  Laterality: N/A;  . ESOPHAGOGASTRODUODENOSCOPY N/A 08/14/2018   Procedure: ESOPHAGOGASTRODUODENOSCOPY (EGD);  Surgeon: Georganna Skeans, MD;  Location: [place];  Service: General;  Laterality: N/A;  bedside  . FACIAL RECONSTRUCTION SURGERY     X 2--once as a teenager and second time in his 66's  . HIP PINNING,CANNULATED Left 07/12/2018   Procedure: CANNULATED HIP PINNING;  Surgeon: Shona Needles, MD;  Location: [place];  Service: Orthopedics;  Laterality: Left;  . HIP SURGERY    . I&D EXTREMITY Left 07/25/2018   Procedure: Debridement of buttock, scrotum and left leg, placement of acell and vac;  Surgeon: [name], DO;  Location: [place];  Service: Plastics;  Laterality: Left;  . I&D EXTREMITY N/A 08/06/2018   Procedure: Debridement of buttock, scrotum and left leg;  Surgeon: [name], DO;  Location: [place];  Service: Plastics;  Laterality: N/A;  . I&D EXTREMITY N/A 08/13/2018   Procedure: Debridement of buttock, scrotum and left leg, placement of acell and vac;  Surgeon: [name], DO;  Location: [place];  Service: Plastics;  Laterality: N/A;  90 min, please  . INCISION AND DRAINAGE OF WOUND N/A 07/18/2018   Procedure: Debridement of left leg, buttocks and scrotal wound with placement of acell and Flexiseal;  Surgeon: [name], DO;  Location: [place];  Service: Plastics;  Laterality: N/A;  . INCISION AND DRAINAGE OF WOUND Left 08/29/2018   Procedure: Debridement of buttock, scrotum and left leg, placement of acell and  vac;  Surgeon: [name], DO;  Location: [place];  Service: Plastics;  Laterality: Left;  75 min, please  . INCISION AND DRAINAGE OF WOUND Bilateral 10/23/2018   Procedure: DEBRIDEMENT OF BUTTOCK,SCROTUM, AND LEG WOUNDS WITH PLACEMENT OF ACELL- BILATERAL 90 MIN;  Surgeon: [name], DO;  Location: [place];  Service: Plastics;  Laterality: Bilateral;  . IR ANGIOGRAM PELVIS SELECTIVE OR SUPRASELECTIVE  07/10/2018  . IR ANGIOGRAM PELVIS SELECTIVE OR SUPRASELECTIVE  07/10/2018  . IR ANGIOGRAM SELECTIVE EACH ADDITIONAL VESSEL  07/10/2018  . IR EMBO ART  VEN HEMORR LYMPH EXTRAV  INC GUIDE ROADMAPPING  07/10/2018  . IR US GUIDE BX ASP/DRAIN  07/10/2018  . IR US GUIDE VASC ACCESS RIGHT  07/10/2018  . IR VENO/EXT/UNI LEFT  07/10/2018  . IRRIGATION AND DEBRIDEMENT OF WOUND WITH SPLIT THICKNESS SKIN GRAFT Left 09/19/2018   Procedure: Debridement of gluteal wound with placement of acell to left leg.;  Surgeon: [name], DO;  Location: [place];  Service: Plastics;  Laterality: Left;  2.5 hours, please  . LAPAROTOMY N/A 07/12/2018   Procedure: EXPLORATORY LAPAROTOMY;  Surgeon: Georganna Skeans, MD;  Location: [place];  Service: General;  Laterality: N/A;  . LAPAROTOMY N/A 07/15/2018   Procedure: WOUND EXPLORATION; CLOSURE OF ABDOMEN;  Surgeon: Georganna Skeans, MD;  Location: [place];  Service: General;  Laterality: N/A;  . LAPAROTOMY  07/10/2018   Procedure: Exploratory Laparotomy;  Surgeon: Clovis Riley, MD;  Location: [place];  Service: General;;  . PEG PLACEMENT N/A 08/14/2018   Procedure: PERCUTANEOUS ENDOSCOPIC GASTROSTOMY (PEG) PLACEMENT;  Surgeon: Georganna Skeans, MD;  Location: [place];  Service: General;  Laterality: N/A;  . PERCUTANEOUS TRACHEOSTOMY N/A 08/02/2018   Procedure: PERCUTANEOUS TRACHEOSTOMY;  Surgeon: Georganna Skeans, MD;  Location: [place];  Service: General;  Laterality: N/A;  . RADIOLOGY WITH ANESTHESIA N/A 07/10/2018   Procedure: IR WITH ANESTHESIA;  Surgeon: Sandi Mariscal, MD;  Location: [place];  Service: Radiology;  Laterality: N/A;  . RADIOLOGY WITH ANESTHESIA Right 07/10/2018   Procedure: Ir With Anesthesia;  Surgeon: Sandi Mariscal, MD;  Location: [place];  Service: Radiology;  Laterality: Right;  . SCROTAL EXPLORATION N/A 07/15/2018   Procedure: SCROTUM DEBRIDEMENT;  Surgeon: Franchot Gallo, MD;  Location: [place];  Service: Urology;  Laterality: N/A;  . SHOULDER SURGERY    . SKIN SPLIT GRAFT Right 09/19/2018   Procedure: Skin Graft Split Thickness;  Surgeon: [name], DO;  Location: [place];  Service: Plastics;  Laterality: Right;  . SKIN SPLIT GRAFT N/A 10/03/2018   Procedure: Split thickness skin graft to gluteal area with acell placement;  Surgeon: [name], DO;  Location: [place];  Service: Plastics;  Laterality: N/A;  3 hours, please  . VACUUM ASSISTED CLOSURE CHANGE N/A 07/12/2018   Procedure: ABDOMINAL VACUUM ASSISTED CLOSURE CHANGE and abdominal washout;  Surgeon: Georganna Skeans, MD;  Location: [place];  Service: General;  Laterality: N/A;  . WOUND DEBRIDEMENT Left 07/23/2018   Procedure: DEBRIDEMENT LEFT BUTTOCK  WOUND;  Surgeon: Georganna Skeans, MD;  Location: [place];  Service:  General;  Laterality: Left;  . WOUND EXPLORATION Left 07/10/2018   Procedure: WOUND EXPLORATION LEFT GROIN;  Surgeon: Clovis Riley, MD;  Location: [place];  Service: General;  Laterality: Left;    Assessment & Plan Clinical Impression:  CARTRELL BENTSEN is a 53 year old male who was originally admitted to [facility] 07/10/18 after being accidentally backed over by a tractor trailer at work. He sustained crush injuries to BLE, complex pelvic ring fractures with dislocation and left femoral neck fracture, degloving/soft tissue injuries to from left hip to the knee, groin and scrotum with complete transection of urethra at bladder neck, He was unable to move BLE, was resuscitated with fluid and massive transfusion, placed in traction and taken to OR emergently for exp  lap with placement of SP tube. He developed compartment syndrome in OR and underwent decompressive laparotomy by Dr. Kae Heller, pelvic arteriogram with embolization of right internal iliac artery by Dr. Pascal Lux and placed in traction by Ainsley Spinner.    Hospital course significant for multiple procedures as below: 1. Exploration and packing of complex left groin wound, decompressive laparotomy and placement of open abdomen VAC - 07/10/18 Dr. Romana Juniper 2. Pelvic arteriogram with embolization of right internal iliac - 07/10/18 Dr. Pascal Lux 3. Placement of suprapubic urinary catheter - 07/10/18 Dr. Pascal Lux 4. Traction - 07/11/18 Ainsley Spinner PA-C 5. Exploratory laparotomy, open abdomen VAC change - 07/12/18 Dr.BurkeThompson 6. Percutaneous fixation of bilateral SI joints, percutaneous fixation of left superior ramus fracture, percutaneous fixation left femoral neck fracture, closed reduction of posterior pelvic ring injury, I&D of open pelvic fracture - 07/12/18 Dr. Lennette Bihari Haddix  7. Debridement of left groin wound and scrotal wound, placement of Acellular matrix, placement of VAC - 07/12/18 Dr. Lennette Bihari Haddix 8. Exploratory laparotomy, closure of abdomen - 07/15/18 Dr. Georganna Skeans 9. Scrotal debridement - 07/15/18 Dr. Annie Main Dahlstedt 10.Excision of bilateral gluteus, left hip and thigh fat and soft tissue; excision of left gluteus muscle, placement of acell - 07/18/18 Dr. Lyndee Leo Dillingham 11. Debridement of left buttock - 07/23/18 Dr. Georganna Skeans 12. Exploratory laparotomy, creation of diverting colostomy - 07/23/18 Dr. Georganna Skeans 13. Excision of necrotic gluteal muscle and skin/fat left leg, placement of Acell - 07/25/18 Dr. Lyndee Leo Dillingham 14. Tracheostomy - 08/02/18 Dr. Georganna Skeans  15. Excision of gluteal necrotic fascia, excision of left leg necrotic fascia and skin, Acell application to gluteal wound, Acell application to left leg wound, Acell application to scrotum - 08/06/18 Dr. Lyndee Leo  Dillingham 16. Application of Acell to gluteal wound, Application of Acell to left scrotum, Excision of left thigh wound, application of Acell to left leg wound - 08/13/18 Dr. Lyndee Leo Dillingham 17. PEG placement - 08/14/18 Dr. Georganna Skeans 18. Debridement of 1I3XY iliac bone, application of Acell to gluteal wound, left leg wound, and scrotum - 08/29/2018 Dr. Lyndee Leo Dillingham 19. Application of Acell to gluteal wound, scrotum, right leg, lateral left leg; split thickness skin graft to left anterior leg; excision 25m coccyx bone - 09/19/18 Dr. CLyndee LeoDillingham 20. Split thickness skin graft to gluteal area 16 x 25 cm, Split thickness skin graft to left lateral and posterior leg 25 x 25 cm, Acell placement to iliac bone, Acell placement to back donor site - 10/03/18 Dr. CAudelia Hives He was treated with ampicillin for enterococcus UTI as well as ciproX 2 weeksfor purulent drainage from pelvic hardware due to pseudomonas infection. He had difficulty with vent wean and [facility] He developed fevers  felt to be due to tracheobronchitis and was treated with cefepime from 5/18 to 5/25. He was taken back to the OR on 10/23/2018 for debridement of gluteal/iliacwound and placement of Acellby Dr. Melbourne Abts has developed SIRS reaction6/79flt to be due to enterococcus UTI and was started on Macrodantin as well as cefepime with recommendations to continue antibiotics through 6/13.  He has had difficulty tolerating tube feeds therefore Reglan added and currently on Juven 45 cc an hour with 50 cc water flushes every hour to maintain adequate hydration. He has had issues with hyponatremia requiring boluses of dextrose last on 6/10. He was started on capping trials and histrach dislodged on 6/5 and not replaced as respiratory status has been stable.FEESwas completed 6/1with trials of ice chips and sips of water ongoing with speech therapy and staff.Reported to have facial  nerve injury with facial reconstruction per records.He has also had intermittent issues with suprapubic tube blockage due to stone/debrisand this was last changed on 5/1?with recommendations to continue monthly SPT changes and to irrigate with 100 to 200 mL's daily and as needed.He has been undergoing therapy due to significant debility. CIR recommended due to improvement in activity tolerance. Records reviewed by Rehab MD and CM and patient felt to be good CIR candidate.   Patient currently requires mod- total with basic self-care skills secondary to muscle weakness, decreased cardiorespiratoy endurance, unbalanced muscle activation and decreased coordination, decreased safety awareness and decreased sitting balance, decreased postural control and decreased balance strategies.  Prior to hospitalization, patient could complete BADLs with independent .  Patient will benefit from skilled intervention to increase independence with basic self-care skills prior to discharge home with family.  Anticipate patient will require 24 hour supervision and moderate physical assestance and follow up home health.  OT Assessment Rehab Potential (ACUTE ONLY): Fair OT Barriers to Discharge: Medical stability;Wound Care;Nutrition means OT Patient demonstrates impairments in the following area(s): Balance;Endurance;Motor;Pain;Safety;Sensory;Skin Integrity;Cognition OT Basic ADL's Functional Problem(s): Eating;Grooming;Bathing;Dressing;Toileting OT Advanced ADL's Functional Problem(s): Simple Meal Preparation OT Transfers Functional Problem(s): Toilet;Tub/Shower OT Additional Impairment(s): Fuctional Use of Upper Extremity OT Plan OT Intensity: Minimum of 1-2 x/day, 45 to 90 minutes OT Frequency: 5 out of 7 days OT Duration/Estimated Length of Stay: 2.5-3 weeks OT Treatment/Interventions: Balance/vestibular training;DME/adaptive equipment instruction;Patient/family education;Therapeutic Activities;Wheelchair  propulsion/positioning;Cognitive remediation/compensation;Psychosocial support;Therapeutic Exercise;UE/LE Strength taining/ROM;Self Care/advanced ADL retraining;Functional mobility training;Community reintegration;Discharge planning;Neuromuscular re-education;Skin care/wound managment;UE/LE Coordination activities;Pain management;Disease mangement/prevention OT Self Feeding Anticipated Outcome(s): No goal-NPO OT Basic Self-Care Anticipated Outcome(s): Min-Mod A OT Toileting Anticipated Outcome(s): No goal due to ostomy + catheter  OT Bathroom Transfers Anticipated Outcome(s): No goal  OT Recommendation Recommendations for Other Services: Neuropsych consult Patient destination: Home Follow Up Recommendations: Home health OT Equipment Recommended: To be determined  Skilled Therapeutic Intervention Skilled OT session completed with focus on initial evaluation, education on OT role/POC, and establishment of patient-centered goals.   Pt greeted in bed with no c/o pain. States that he "can't breathe" however 02 sats monitored throughout, with readings of 99-100% on RA. Used suction for managing his secretions. Max A of 1 for supine<sit. Pt able to maintain sitting balance with steady assist-supervision and unilateral UE support on bed. He was a little anxious EOB, reluctant to reach or wash himself due to FOF.  Min A for balance at times just for reassurance. Min A also for washing Rt side due to flank pain. 3-/5 in UEs bilaterally, with pt unable to reach either hand behind back. He donned an overhead shirt with Mod A. Pt  then returned to supine with 2 assist. He was able to use Lt arm to assist when 2 helpers boosted him up in bed. Rolling Rt>Lt completed with Max A while 2nd helper completed perihygiene. Unable to bend Lt knee and certain movement/touch was painful in this limb. Noted skin breakdown Lt groin. RN made aware. Due to soiled dressings on buttocks, pants were not donned. Towels were placed  between pt and replaced chuck pad. RN made aware of his need for dressing change. He was repositioned for comfort and left with all needs within reach.   Pt reported dizziness of 5/10 when initially EOB. After a few minutes of seated rest and ADL engagement, he reported this decreased to 2/10. For remainder of session, when asked about dizziness he reported "a little" but did not appear symptomatic.   OT Evaluation Precautions/Restrictions  Precautions Precautions: Fall Precaution Comments: PEG, ostomy, SP catheter, multiple healing skin grafts/wounds periarea and B LEs  Restrictions Weight Bearing Restrictions: No RLE Weight Bearing: Weight bearing as tolerated LLE Weight Bearing: Weight bearing as tolerated General Chart Reviewed: Yes Family/Caregiver Present: No Vital Signs Therapy Vitals Pulse Rate: 88 Resp: 18 BP: 136/88 Patient Position (if appropriate): Lying Oxygen Therapy SpO2: 98 % O2 Device: Room Air Pain Pain Assessment Pain Score: 0-No pain Faces Pain Scale: No hurt Home Living/Prior Functioning Home Living Family/patient expects to be discharged to:: Private residence Living Arrangements: Spouse/significant other, Children Available Help at Discharge: Family Type of Home: House Home Access: Ramped entrance Home Layout: One level Bathroom Shower/Tub: Chiropodist: Standard Bathroom Accessibility: Yes Additional Comments: Pt reports friends are making home w/c accessible, including widening doorways  Lives With: Family(fiance, dtr, and son) IADL History Occupation: Full time employment 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 Leisure and Hobbies: Tinkering with his car Prior Function Level of Independence: Independent with basic ADLs, Independent with transfers  Able to Take Stairs?: Yes Driving: Yes Vocation: Full time employment ADL ADL Eating: (NPO) Grooming: Moderate assistance Upper Body Bathing: Moderate  assistance Where Assessed-Upper Body Bathing: Edge of bed Lower Body Bathing: Other (comment)(2 assist) Where Assessed-Lower Body Bathing: Bed level Upper Body Dressing: Moderate assistance Where Assessed-Upper Body Dressing: Edge of bed Lower Body Dressing: Dependent Where Assessed-Lower Body Dressing: Bed level Toileting: Other (Comment)(NA due to ostomy and SP catheter) Tub/Shower Transfer: Not assessed Vision Baseline Vision/History: Wears glasses Wears Glasses: Reading only Patient Visual Report: No change from baseline Vision Assessment?: No apparent visual deficits Perception  Perception: Within Functional Limits Praxis Praxis: Intact Cognition Overall Cognitive Status: No family/caregiver present to determine baseline cognitive functioning Arousal/Alertness: Awake/alert Orientation Level: Person;Place;Situation Person: Oriented Place: Oriented Situation: Oriented Year: 2020 Month: June Day of Week: Correct Immediate Memory Recall: Blue Memory Recall: Sock;Blue;Bed Memory Recall Sock: Without Cue Memory Recall Blue: Without Cue Memory Recall Bed: Without Cue Attention: Sustained Sustained Attention: Appears intact Sensation Sensation Light Touch: Impaired Detail Light Touch Impaired Details: Impaired LLE Coordination Gross Motor Movements are Fluid and Coordinated: No Fine Motor Movements are Fluid and Coordinated: No Coordination and Movement Description: Tremulous UEs, limited functional mobility L LE Finger Nose Finger Test: Dysmetria bilaterally Motor  Motor Motor: Other (comment) Motor - Skilled Clinical Observations: Significant deconditioning due to prolonged hospital stay Mobility    Max A supine<sit  Trunk/Postural Assessment  Cervical Assessment Cervical Assessment: Exceptions to WFL(forward head) Thoracic Assessment Thoracic Assessment: (Rt lean in sitting, rounded shoulders) Lumbar Assessment Lumbar Assessment: (posterior pelvic tilt)   Balance Dynamic Sitting Balance  Sitting balance - Comments: steady assist during UB dressing EOB Extremity/Trunk Assessment RUE Assessment RUE Assessment: Exceptions to [facility] Active Range of Motion (AROM) Comments: Limited shoulder IR General Strength Comments: 3-/5 grossly LUE Assessment LUE Assessment: Exceptions to [facility] Active Range of Motion (AROM) Comments: Limited shoulder IR General Strength Comments: 3-/5 grossly     Refer to Care Plan for Long Term Goals  Recommendations for other services: Neuropsych   Discharge Criteria: Patient will be discharged from OT if patient refuses treatment 3 consecutive times without medical reason, if treatment goals not met, if there is a change in medical status, if patient makes no progress towards goals or if patient is discharged from hospital.  The above assessment, treatment plan, treatment alternatives and goals were discussed and mutually agreed upon: by patient  Skeet Simmer 11/24/2018, 12:44 PM

## 2018-11-24 NOTE — Progress Notes (Signed)
Patient is on RA, Stoma site covered with clean dressing. No redness or swelling. Vitals stable. RT will continue to monitor.
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
## 2018-11-24 NOTE — Progress Notes (Signed)
Patient's 2 piece ostomy was completely changed after the previous ostomy became detached and began leaking. Any dressings that had become soiled with stool were changed and the major dressings were reinforced.

## 2018-11-24 NOTE — Evaluation (Addendum)
Speech Language Pathology Assessment and Plan  Patient Details  Name: Christopher [name] MRN: [id number] Date of Birth: 07-21-1965  SLP Diagnosis: Dysarthria;Dysphagia  Rehab Potential: Good ELOS: 2.5-3 weeks    Today's Date: 11/24/2018 SLP Individual Time: 1430-1550 SLP Individual Time Calculation (min): 80 min   Problem List:  Patient Active Problem List   Diagnosis Date Noted  . Debility 11/23/2018  . Acute on chronic respiratory failure with hypoxia ([place])   . Bacteremia due to Pseudomonas   . Chronic pain syndrome   . Multiple traumatic injuries   . Pressure injury of skin 08/13/2018  . Acute hemorrhage 07/13/2018  . Degloving injury of lower leg, initial encounter 07/13/2018  . Urethral injury 07/13/2018  . Scrotal injury, initial encounter 07/13/2018  . Fracture of femoral neck, left ([place]) 07/10/2018  . Multiple fractures of pelvis with unstable disruption of pelvic ring, initial encounter for open fracture ([place]) 07/10/2018   Past Medical History:  Past Medical History:  Diagnosis Date  . Acute on chronic respiratory failure with hypoxia ([place])   . Bacteremia due to Pseudomonas   . Chronic pain syndrome   . Multiple traumatic injuries    Past Surgical History:  Past Surgical History:  Procedure Laterality Date  . APPLICATION OF A-CELL OF BACK N/A 08/06/2018   Procedure: Application Of A-Cell Of Back;  Surgeon: [name], DO;  Location: [place];  Service: Plastics;  Laterality: N/A;  . APPLICATION OF A-CELL OF EXTREMITY Left 08/06/2018   Procedure: Application Of A-Cell Of Extremity;  Surgeon: [name], DO;  Location: [place];  Service: Plastics;  Laterality: Left;  . APPLICATION OF WOUND VAC  07/12/2018   Procedure: Application Of Wound Vac to the Left Thigh and Scrotum.;  Surgeon: Shona Needles, MD;  Location: [place];  Service: Orthopedics;;  . APPLICATION OF WOUND VAC  07/10/2018   Procedure: Application Of Wound Vac;  Surgeon: Clovis Riley,  MD;  Location: [place];  Service: General;;  . COLOSTOMY N/A 07/23/2018   Procedure: COLOSTOMY;  Surgeon: Georganna Skeans, MD;  Location: [place];  Service: General;  Laterality: N/A;  . CYSTOSCOPY W/ URETERAL STENT PLACEMENT N/A 07/15/2018   Procedure: RETROGRADE URETHROGRAM;  Surgeon: Franchot Gallo, MD;  Location: [place];  Service: Urology;  Laterality: N/A;  . ESOPHAGOGASTRODUODENOSCOPY N/A 08/14/2018   Procedure: ESOPHAGOGASTRODUODENOSCOPY (EGD);  Surgeon: Georganna Skeans, MD;  Location: [place];  Service: General;  Laterality: N/A;  bedside  . FACIAL RECONSTRUCTION SURGERY     X 2--once as a teenager and second time in his 55's  . HIP PINNING,CANNULATED Left 07/12/2018   Procedure: CANNULATED HIP PINNING;  Surgeon: Shona Needles, MD;  Location: [place];  Service: Orthopedics;  Laterality: Left;  . HIP SURGERY    . I&D EXTREMITY Left 07/25/2018   Procedure: Debridement of buttock, scrotum and left leg, placement of acell and vac;  Surgeon: [name], DO;  Location: [place];  Service: Plastics;  Laterality: Left;  . I&D EXTREMITY N/A 08/06/2018   Procedure: Debridement of buttock, scrotum and left leg;  Surgeon: [name], DO;  Location: [place];  Service: Plastics;  Laterality: N/A;  . I&D EXTREMITY N/A 08/13/2018   Procedure: Debridement of buttock, scrotum and left leg, placement of acell and vac;  Surgeon: [name], DO;  Location: [place];  Service: Plastics;  Laterality: N/A;  90 min, please  . INCISION AND DRAINAGE OF WOUND N/A 07/18/2018   Procedure: Debridement of  left leg, buttocks and scrotal wound with placement of acell and Flexiseal;  Surgeon: [name], DO;  Location: [place];  Service: Plastics;  Laterality: N/A;  . INCISION AND DRAINAGE OF WOUND Left 08/29/2018   Procedure: Debridement of buttock, scrotum and left leg, placement of acell and vac;  Surgeon: [name], DO;  Location: [place];  Service: Plastics;  Laterality: Left;  75 min,  please  . INCISION AND DRAINAGE OF WOUND Bilateral 10/23/2018   Procedure: DEBRIDEMENT OF BUTTOCK,SCROTUM, AND LEG WOUNDS WITH PLACEMENT OF ACELL- BILATERAL 90 MIN;  Surgeon: [name], DO;  Location: [place];  Service: Plastics;  Laterality: Bilateral;  . IR ANGIOGRAM PELVIS SELECTIVE OR SUPRASELECTIVE  07/10/2018  . IR ANGIOGRAM PELVIS SELECTIVE OR SUPRASELECTIVE  07/10/2018  . IR ANGIOGRAM SELECTIVE EACH ADDITIONAL VESSEL  07/10/2018  . IR EMBO ART  VEN HEMORR LYMPH EXTRAV  INC GUIDE ROADMAPPING  07/10/2018  . IR US GUIDE BX ASP/DRAIN  07/10/2018  . IR US GUIDE VASC ACCESS RIGHT  07/10/2018  . IR VENO/EXT/UNI LEFT  07/10/2018  . IRRIGATION AND DEBRIDEMENT OF WOUND WITH SPLIT THICKNESS SKIN GRAFT Left 09/19/2018   Procedure: Debridement of gluteal wound with placement of acell to left leg.;  Surgeon: [name], DO;  Location: [place];  Service: Plastics;  Laterality: Left;  2.5 hours, please  . LAPAROTOMY N/A 07/12/2018   Procedure: EXPLORATORY LAPAROTOMY;  Surgeon: Georganna Skeans, MD;  Location: [place];  Service: General;  Laterality: N/A;  . LAPAROTOMY N/A 07/15/2018   Procedure: WOUND EXPLORATION; CLOSURE OF ABDOMEN;  Surgeon: Georganna Skeans, MD;  Location: [place];  Service: General;  Laterality: N/A;  . LAPAROTOMY  07/10/2018   Procedure: Exploratory Laparotomy;  Surgeon: Clovis Riley, MD;  Location: [place];  Service: General;;  . PEG PLACEMENT N/A 08/14/2018   Procedure: PERCUTANEOUS ENDOSCOPIC GASTROSTOMY (PEG) PLACEMENT;  Surgeon: Georganna Skeans, MD;  Location: [place];  Service: General;  Laterality: N/A;  . PERCUTANEOUS TRACHEOSTOMY N/A 08/02/2018   Procedure: PERCUTANEOUS TRACHEOSTOMY;  Surgeon: Georganna Skeans, MD;  Location: [facility];  Service: General;  Laterality: N/A;  . RADIOLOGY WITH ANESTHESIA N/A 07/10/2018   Procedure: IR WITH ANESTHESIA;  Surgeon: Sandi Mariscal, MD;  Location: [place];  Service: Radiology;  Laterality: N/A;  . RADIOLOGY WITH ANESTHESIA Right  07/10/2018   Procedure: Ir With Anesthesia;  Surgeon: Sandi Mariscal, MD;  Location: [place];  Service: Radiology;  Laterality: Right;  . SCROTAL EXPLORATION N/A 07/15/2018   Procedure: SCROTUM DEBRIDEMENT;  Surgeon: Franchot Gallo, MD;  Location: [place];  Service: Urology;  Laterality: N/A;  . SHOULDER SURGERY    . SKIN SPLIT GRAFT Right 09/19/2018   Procedure: Skin Graft Split Thickness;  Surgeon: [name], DO;  Location: [place];  Service: Plastics;  Laterality: Right;  . SKIN SPLIT GRAFT N/A 10/03/2018   Procedure: Split thickness skin graft to gluteal area with acell placement;  Surgeon: [name], DO;  Location: [place];  Service: Plastics;  Laterality: N/A;  3 hours, please  . VACUUM ASSISTED CLOSURE CHANGE N/A 07/12/2018   Procedure: ABDOMINAL VACUUM ASSISTED CLOSURE CHANGE and abdominal washout;  Surgeon: Georganna Skeans, MD;  Location: [place];  Service: General;  Laterality: N/A;  . WOUND DEBRIDEMENT Left 07/23/2018   Procedure: DEBRIDEMENT LEFT BUTTOCK  WOUND;  Surgeon: Georganna Skeans, MD;  Location: [place];  Service: General;  Laterality: Left;  . WOUND EXPLORATION Left 07/10/2018   Procedure: WOUND EXPLORATION LEFT GROIN;  Surgeon: Clovis Riley, MD;  Location: [facility];  Service: General;  Laterality: Left;    Assessment / Plan / Recommendation Clinical Impression Presented 07/10/2018 brought to [facility] as a level 1 trauma after accidentally backed over with a tractor trailerat work. No LOC or apparent head trauma. Pain to left side and left leg, unable to move his legs. Obvious open wound to left groin and scrotum. Taken to OR for hemorrhagecontrol. Urology consulted for urethral trauma and scrotal degloving. Developedabdominal compartment syndrome in OR and underwentdecompressive laparotomy. IR performed pelvicarteriogram and embolizationof right internal iliac and SP tube placement.  Hospital course with ortho consulted recommended operative fixation of  pelvis when stabilized and placed in traction 1/29. Plasticsconsulted 1/29 for complexdegloving of left groin/thigh and scrotal degloving. To OR with trauma and orthopedic surgery 1/30 for abdominalwashout and pelvic fixation.   Underwent multiple OR proceduresduring hospitalization 1/28 exploration and packing of complex left groin wound, decompressive laparotomy and placement ofAbdominalwound VAC . Pelvic arteriogram with embolization of right internal iliac, placement of SP catheter. 1/29 Traction placement 1/30 Exploratory laparotomy, open abdomen VAC change,percutaneousfixation of bilateral SI joints, percutaneous fixation of left superior ramus FX, percutaneousfixation left femoral neck fracture, closed reduction of posterior pelvic ring injury, I and D pelvic fracture, debridement of left groin wound and scrotal wound 2/2explorationlap, closure of abdomen, scrotal debridement 2/5 Excision of bilateral gluteus, left hip and thigh fat and soft tissue, excision of left gluteus muscle, placement ofA cell 2/10debridementof left buttock, exp lap with creation of diverting colostomy 2/12 excision of necrotic gluteal muscle and skin/fat left leg,placementof A cell 2/20 tracheostomy 2/24 excision of gluteal necrotic fascia, excision of left leg necrotic fascia,placementof A cell 3/excisionand application of A cellto gluteal wound, left scrotum, left thigh, and left leg 3/3 PEGplacement 4/[id number] iliac bone, application of A cellgluteal wound, left leg and scrotum 4/8 application ofA cellto gluteal wound, scrotum, right leg, lateral left leg, STSGto left anterior leg, excisionof coccyx bone 4/22STSGto gluteal area, left lateral and posterior leg, A cellto iliac bone and to back donor site   [facility]fusedas needed, noted to have enterococcus UTI for which he was treated withampicillin. Noted some purulent drainage from his gluteal  wound around his pelvic hardware. This was cultured and revealed pseudomonas. Given Cipro for 14 days. Ready for discharge to [place] on 10/16/18 on Vent via trach.  Pulmonologistconsulted at [facility]. Cipro continued until 5/12 and then patient received ampicillin for enterococcus faecalis UTI which was later switched to nitrofurantoin. BP controlled by Lopressor. Alsoreceived course of IV Zosyn.[facility] for gluteal wound debridement. Wounds attendedby Woundcare Nurse. Patienttreated for hypernatremia and weaning trials improved after he was ordered fish oil, Provigil, amantadine and melatonin. Began on Scopolamine patchfor secretions. Eventuallyweaned form Vent and began capping trials on 5/28 with eventualdecannulation on 6/5 without difficulty. Now on room air. Productive sputum that patient able to manage andsuctionby self. Remained on Protonix for GI prophylaxis , Lovenox for DVT. Continues on Pegtube feeds . Urine culture on 6/7 with greater than 1000,00Enterococcusfaecalis. Restarted on nitrofurantoin and continued on Cefepime. Encephalopathy resolved.  FEES with SLP 6/1 to begin trails with ice chips sips of water with SLP and staff. Poor tongue elevation, no epiglottic inversion, poor laryngeal elevation/anterior movement.Patientwith history of facialinjury with facial reconstruction surgery per fiance.  [facility] admission coordinator and physician.  Pt presents with mild cognitive linguistic impairment primarily in  short term recall of novel information, however appear to be baseline deficit. Cognitive linguistic assessment, Cognistat, pt demonstrated mild impairment in attention, short term/immediate recall, calculations and similarities. Pt demonstrated selective attention in distracting environment, suggest attention  deficits noted on formal assessment largely due to memory deficits. Pt supported being at cognitive baseline, with noted baseline impairments in memory, mental math and abstract thinking. SLP spoke with pt's fianc via phone and she supported ability to recall events/conversation from pervious day, indicating recall of novel information. Pt appears to be at baseline function for cognition, however ST can reassess at next level of care. Pt demonstrates no noted deficits in language, ability to name common objects and communicate at sentence level. Pt presents with mild impairments in speech at sentence level with 85% intelligibility, suggest due to edema from intubation, intermittent wet vocal quality and reduced vocal intensity.  Per limited information from chart review, pt participated in [facility] on 6[facility]1, however SLP was unable to locate results and pt remains on NPO with trials of ice/thin with SLP only. On today's BBS pt presents with mild general weakness on oral motor exam. Pt consumed ice chips of various size and thin sips via cup, pt demonstrated appropriate oral containment, swallow time appeared appropriate and no noted s/s aspiration. SLP ordered follow up [place] for 6/15 to assess swallow function on instrumental assessment, supported by tolerances of ice chips trials with SLP on select (although data is limited), 6/8 x-chest indicating no s/s pneumonia and today's BBE. SLP recommends continued NPO diet with ice chip/thin trials with SLP only and goals will be adjusted if needed following upcoming MBS. Pt would benefit from skilled ST services in order to maximize functional independence and reduce burden of care, likely requiring 24 hour supervision and possibly continue ST services for dysphagia.   Skilled Therapeutic Interventions          Skilled ST services focused on education and cognitive skills. SLP administered cognitive linguistic assessment, educated pt on results and created plan to address  deficits. SLP communicated with pt's fiances on phone to assist in establishment of baseline function and provided education about current swallow function and plan for MBS. All questions were answered to satisfaction. Pt was left in room with call bell within reach and bed alarm set. ST recommends to continue skilled ST services.   SLP Assessment  Patient will need skilled Speech Lanaguage Pathology Services during CIR admission    Recommendations  SLP Diet Recommendations: NPO Medication Administration: Via alternative means Postural Changes and/or Swallow Maneuvers: Seated upright 90 degrees Oral Care Recommendations: Oral care BID Patient destination: Home Follow up Recommendations: 24 hour supervision/assistance;Home Health SLP(if swallowing goals are not met) Equipment Recommended: None recommended by SLP    SLP Frequency 3 to 5 out of 7 days    SLP Duration  SLP Intensity  SLP Treatment/Interventions 2.5-3 weeks  Minumum of 1-2 x/day, 30 to 90 minutes  Cueing hierarchy;Patient/family education;Therapeutic Activities;Therapeutic Exercise;Dysphagia/aspiration precaution training    Pain Pain Assessment Pain Score: 0-No pain  Prior Functioning Cognitive/Linguistic Baseline: Baseline deficits Baseline deficit details: memory  Lives With: Significant other Available Help at Discharge: Family Education: 11th Vocation: Full time employment  Short Term Goals: Week 1: SLP Short Term Goal 1 (Week 1): Pt will participate in instrumental swallow assessment, MBS, to assess swallow function. SLP Short Term Goal 2 (Week 1): Pt will utilize speech intelligibility strategies at sentence level for 90% intelligibility with supervision A verbal cues in a quiet envvironment.  Refer to Care Plan for Long Term Goals  Recommendations for other services: None   Discharge Criteria: Patient will be discharged from SLP if patient refuses treatment 3 consecutive times without medical reason,  if treatment goals not met, if there is a change in medical status, if patient makes no progress towards goals or if patient is discharged from hospital.  The above assessment, treatment plan, treatment alternatives and goals were discussed and mutually agreed upon: by patient  Airika Alkhatib  [facility] 11/24/2018, 4:54 PM

## 2018-11-25 LAB — CBC
HCT: 26 % — ABNORMAL LOW (ref 39.0–52.0)
Hemoglobin: 7.8 g/dL — ABNORMAL LOW (ref 13.0–17.0)
MCH: 32.4 pg (ref 26.0–34.0)
MCHC: 30 g/dL (ref 30.0–36.0)
MCV: 107.9 fL — ABNORMAL HIGH (ref 80.0–100.0)
Platelets: 439 10*3/uL — ABNORMAL HIGH (ref 150–400)
RBC: 2.41 MIL/uL — ABNORMAL LOW (ref 4.22–5.81)
RDW: 13 % (ref 11.5–15.5)
WBC: 7.8 10*3/uL (ref 4.0–10.5)
nRBC: 0 % (ref 0.0–0.2)

## 2018-11-25 LAB — FERRITIN: Ferritin: 877 ng/mL — ABNORMAL HIGH (ref 24–336)

## 2018-11-25 LAB — GLUCOSE, CAPILLARY
Glucose-Capillary: 105 mg/dL — ABNORMAL HIGH (ref 70–99)
Glucose-Capillary: 112 mg/dL — ABNORMAL HIGH (ref 70–99)
Glucose-Capillary: 114 mg/dL — ABNORMAL HIGH (ref 70–99)
Glucose-Capillary: 120 mg/dL — ABNORMAL HIGH (ref 70–99)
Glucose-Capillary: 120 mg/dL — ABNORMAL HIGH (ref 70–99)
Glucose-Capillary: 97 mg/dL (ref 70–99)

## 2018-11-25 LAB — TSH: TSH: 0.939 u[IU]/mL (ref 0.350–4.500)

## 2018-11-25 LAB — VITAMIN B12: Vitamin B-12: 851 pg/mL (ref 180–914)

## 2018-11-25 NOTE — Progress Notes (Signed)
Subjective: Patient is feeling somewhat stronger this morning.  He is alert.  He is talkative this morning.  Objective:BP 135/83 (BP Location: Left Arm)   Pulse (!) 104   Temp 97.7 F (36.5 C) (Oral)   Resp 18   Wt 71.2 kg   SpO2 97%   BMI 20.16 kg/m    Frail, thin, ill-appearing male in no acute distress.  HEENT exam face is thin.  Oral mucosa is moist.  Dentition is poor.  Neck is supple without lymphadenopathy or thyromegaly.  Chest is without increased work of breathing.  Cardiac exam S1 and S2 are regular.  Abdominal exam thin, active bowel sounds, soft, nontender.  G-tube is present.  Suprapubic tube is present.  Extremities there is no edema.  Neurologic exam the patient is alert. Assessment and plan.  Medical Problem List and Plan: 1.Functional and mobility deficitssecondary to major multiple trauma including numerous soft tissue injuries, pelvic fractures, and left sciatic nerve injury Admitted to inpatient rehab.  Note that the original accident happened in January. 2. Antithrombotics: -DVT/anticoagulation:Pharmaceutical:Lovenox -antiplatelet therapy: N/A 3. Pain Management:Fentanyl 50 mcg/72 hrs with dilaudid prn for breakthrough pain. 4. Mood:LCSW to follow for evaluation and support. Team to provide ego support. -antipsychotic agents: N/A 5. Neuropsych: This patientiscapable of making decisions on hisown behalf. 6.Multipledegloving injuries with necrosis/Skin/Wound Care:continue local care to ostomy and tube site. Dressings in place for back/buttock areas.  Wound care nurse consult. 7. Fluids/Electrolytes/Nutrition:We will ask for nutrition consult. 8. Acute on chronic respiratory failure: Currently stable. 9.Recent SIRSdue to enterococcus AVW:[id number] cefepime today.  And completed Macrodantin on 6/13. 10.Urethral disruption/ SPCdependent: Change once a month and flush daily and prn to prevent  blockage.. 11. Anemia of chronic disease/ABLA:Macrocytic anemia.  Unclear etiology.  Will check labs. 12. Hypernatremia:reolsved Basic Metabolic Panel:    Component Value Date/Time   NA 143 11/24/2018 1250   K 3.3 (L) 11/24/2018 1250   CL 110 11/24/2018 1250   CO2 22 11/24/2018 1250   BUN 18 11/24/2018 1250   CREATININE 0.33 (L) 11/24/2018 1250   GLUCOSE 112 (H) 11/24/2018 1250   CALCIUM 9.6 11/24/2018 1250  hypokalemia- will follow   13. Tachycardia: Continue to monitor HR tid. Lopressor increased to 50 mg tid on 6/9 14.Encephalopathy: On Amantadine and provigil for activation.  15. Anxiety disorder:Currently on multiple medications--Zoloft 25 mg with Seroquel 200 mg tid and Klonopin 0.5 daily in addition to prn Klonopin.  16. Dysphagia/Gastroparesis: Continue NPO status. Will continue current regimen and adjust tube feeds/flushes as toleratedto better meet nutritional needs 17. Hypokalemia: on kcl Will f/u labs

## 2018-11-25 NOTE — Progress Notes (Signed)
Patient is on room air. Stoma is covered with clean dressing. No redness or swelling present. Vitals are stable. Patient says has no pain at site and is resting comfortably. RT will continue to monitor.

## 2018-11-26 ENCOUNTER — Inpatient Hospital Stay (HOSPITAL_COMMUNITY): Payer: Self-pay | Admitting: Speech Pathology

## 2018-11-26 ENCOUNTER — Encounter (HOSPITAL_COMMUNITY): Payer: Self-pay | Admitting: Speech Pathology

## 2018-11-26 ENCOUNTER — Inpatient Hospital Stay (HOSPITAL_COMMUNITY): Payer: Self-pay

## 2018-11-26 ENCOUNTER — Inpatient Hospital Stay (HOSPITAL_COMMUNITY): Payer: Self-pay | Admitting: Occupational Therapy

## 2018-11-26 ENCOUNTER — Inpatient Hospital Stay (HOSPITAL_COMMUNITY): Payer: No Typology Code available for payment source

## 2018-11-26 DIAGNOSIS — F411 Generalized anxiety disorder: Secondary | ICD-10-CM

## 2018-11-26 DIAGNOSIS — E876 Hypokalemia: Secondary | ICD-10-CM

## 2018-11-26 DIAGNOSIS — E870 Hyperosmolality and hypernatremia: Secondary | ICD-10-CM

## 2018-11-26 LAB — GLUCOSE, CAPILLARY
Glucose-Capillary: 104 mg/dL — ABNORMAL HIGH (ref 70–99)
Glucose-Capillary: 105 mg/dL — ABNORMAL HIGH (ref 70–99)
Glucose-Capillary: 105 mg/dL — ABNORMAL HIGH (ref 70–99)
Glucose-Capillary: 117 mg/dL — ABNORMAL HIGH (ref 70–99)
Glucose-Capillary: 119 mg/dL — ABNORMAL HIGH (ref 70–99)
Glucose-Capillary: 120 mg/dL — ABNORMAL HIGH (ref 70–99)

## 2018-11-26 MED ORDER — QUETIAPINE FUMARATE 50 MG PO TABS
150.0000 mg | ORAL_TABLET | Freq: Three times a day (TID) | ORAL | Status: DC
  Administered 2018-11-26 – 2018-11-30 (×12): 150 mg
  Filled 2018-11-26 (×8): qty 3
  Filled 2018-11-26: qty 6
  Filled 2018-11-26 (×3): qty 3

## 2018-11-26 MED ORDER — ENSURE ENLIVE PO LIQD: 237.0000 mL | Freq: Three times a day (TID) | ORAL | Status: DC

## 2018-11-26 MED ORDER — VITAL 1.5 CAL PO LIQD
1000.0000 mL | ORAL | Status: DC
  Administered 2018-11-26: 1000 mL
  Filled 2018-11-26 (×3): qty 1000

## 2018-11-26 MED ORDER — FREE WATER
200.0000 mL | Freq: Four times a day (QID) | Status: DC
  Administered 2018-11-26 – 2018-11-27 (×3): 200 mL

## 2018-11-26 NOTE — Progress Notes (Signed)
Patient information reviewed and entered into eRehab System by Becky Harsimran Westman, PPS coordinator. Information including medical coding, function ability, and quality indicators will be reviewed and updated through discharge.   

## 2018-11-26 NOTE — Progress Notes (Signed)
Speech Language Pathology Daily Session Note  Patient Details  Name: Christopher [name] MRN: [id number] Date of Birth: 02/28/1966  Today's Date: 11/27/2018 SLP Individual Time:  -     Short Term Goals: Week 1: SLP Short Term Goal 1 (Week 1): Pt will participate in instrumental swallow assessment, MBS, to assess swallow function. SLP Short Term Goal 1 - Progress (Week 1): Met SLP Short Term Goal 2 (Week 1): Pt will utilize speech intelligibility strategies at sentence level for 90% intelligibility with supervision A verbal cues in a quiet envvironment. SLP Short Term Goal 3 (Week 1): Pt will consume current diet with minimal overt s/s of aspiration /dysphagia and Min A cues for use of compensatory swallow strategies. SLP Short Term Goal 4 (Week 1): Pt will utilize speech intelligibility strategies to achieve > 95% accuracy at sentence level with superivsion cues. SLP Short Term Goal 5 (Week 1): Pt will consume trials of dysphagia 3 with minimal overt s/s of aspiration and decreased effort to complete oral phase over 3 sessions prior to diet upgrade.  Skilled Therapeutic Interventions: Skilled treatment session focused on dysphagia goals and education. SLP provided education on result of MBS, diet recommendation and explanation of different level of diet textures. SLP further facilitated session by providing skilled observation of pt consuming regular lunch tray with thin liquids. Of note, pt's oral cavity has copious amounts of dried secretions and SLP with pt assistance worked for over 10 minutes to effectively clean mouth prior to consumption. Pt able to cut turkey into small pieces and orally pt masticated and cleared bolus well. However, pt continues to struggle with endurance and would benefit from Dysphagia 2 diet for energy conservation. Pt able to upgrade at bedside as energy levels and endurance improvement. Education provided to pt's nurse, posted in room and pt agreeable. Pt handed off to  nursing to finish supervision during meal. Continue per current plan of care.      Pain    Therapy/Group: Individual Therapy    11/27/2018, 8:48 AM   

## 2018-11-26 NOTE — Progress Notes (Signed)
[place] PHYSICAL MEDICINE & REHABILITATION PROGRESS NOTE   Subjective/Complaints: Pt denies any new problems over the weekend. Noted that he was up out of bed more. Pain without change  ROS: Patient denies fever, rash, sore throat, blurred vision, nausea, vomiting, diarrhea, cough, shortness of breath or chest pain,  headache, or mood change.    Objective:   No results found. Recent Labs    11/24/18 1250 11/25/18 1036  WBC 9.4 7.8  HGB 8.7* 7.8*  HCT 29.8* 26.0*  PLT 457* 439*   Recent Labs    11/24/18 1250  NA 143  K 3.3*  CL 110  CO2 22  GLUCOSE 112*  BUN 18  CREATININE 0.33*  CALCIUM 9.6    Intake/Output Summary (Last 24 hours) at 11/26/2018 1005 Last data filed at 11/26/2018 0830 Gross per 24 hour  Intake 480 ml  Output 3275 ml  Net -2795 ml     Physical Exam: Vital Signs Blood pressure (!) 144/83, pulse 92, temperature 98.1 F (36.7 C), temperature source Oral, resp. rate 19, weight 69.4 kg, SpO2 98 %. Constitutional: No distress . Vital signs reviewed. frail HEENT: EOMI, oral membranes a little more moist. Still debris on teeth Neck: supple, no air leakage thru former stoma Cardiovascular: RRR without murmur. No JVD    Respiratory: CTA Bilaterally without wheezes or rales. Normal effort    GI: BS +, non-tender, non-distended  Genitourinary: Genitourinary Comments: SPC in place. Musculoskeletal:  Comments: Heel cords remaintight bilaterally, left more than right. Left leg tender with attempts at PROM Neurological: He is alert. Oriented to person, place. Month, year. Can provide biographicl information. UE 3- to 3/5 deltoids, biceps, triceps, wrist/hands 3+/5. RLE 2/5 prox to distal. LLE with trace to 1/5 KE, HF, 0/5 otherwise. Decreased sensation left foot, dorsal and plantar surfaces, leg appears dysesthetic.  seemed to sense pain and light touch more normally in proximal LLE. RLE without obvious sensory findings. DTR's trace Skin: He isnot  diaphoretic. Healed grafts bilateral upper thighs. Extensive scarring, healing road rash along upper to lower back. Low back with scarring, granulation, some associated drainage  Psychiatric:  Appears to be a bit more engaging today     Assessment/Plan: 1. Functional deficits secondary to debility, polytrauma  which require 3+ hours per day of interdisciplinary therapy in a comprehensive inpatient rehab setting.  Physiatrist is providing close team supervision and 24 hour management of active medical problems listed below.  Physiatrist and rehab team continue to assess barriers to discharge/monitor patient progress toward functional and medical goals  Care Tool:  Bathing    Body parts bathed by patient: Left arm, Chest, Abdomen, Face   Body parts bathed by helper: Buttocks Body parts n/a: Buttocks   Bathing assist Assist Level: 2 Helpers     Upper Body Dressing/Undressing Upper body dressing   What is the patient wearing?: Pull over shirt    Upper body assist Assist Level: 2 Helpers    Lower Body Dressing/Undressing Lower body dressing    Lower body dressing activity did not occur: Safety/medical concerns What is the patient wearing?: Pants     Lower body assist Assist for lower body dressing: 2 Helpers     Toileting Toileting Toileting Activity did not occur (Clothing management and hygiene only): Safety/medical concerns  Toileting assist       Transfers Chair/bed transfer  Transfers assist  Chair/bed transfer activity did not occur: Safety/medical concerns  Chair/bed transfer assist level: 2 Administrator, Civil ServiceHelpers     Locomotion Ambulation  Ambulation assist   Ambulation activity did not occur: Safety/medical concerns          Walk 10 feet activity   Assist           Walk 50 feet activity   Assist Walk 50 feet with 2 turns activity did not occur: Safety/medical concerns         Walk 150 feet activity   Assist Walk 150 feet activity  did not occur: Safety/medical concerns         Walk 10 feet on uneven surface  activity   Assist Walk 10 feet on uneven surfaces activity did not occur: Safety/medical concerns         Wheelchair     Assist Will patient use wheelchair at discharge?: Yes Type of Wheelchair: Manual Wheelchair activity did not occur: Safety/medical concerns         Wheelchair 50 feet with 2 turns activity    Assist            Wheelchair 150 feet activity     Assist Wheelchair 150 feet activity did not occur: Safety/medical concerns        Medical Problem List and Plan: 1.Functional and mobility deficitssecondary to major multiple trauma including numerous soft tissue injuries, pelvic fractures, and left sciatic nerve injury -Continue CIR therapies including PT, OT, and SLP  -pt is significantly debilitated as well after his prolonged hospital stay. this accident took place in January  2. Antithrombotics: -DVT/anticoagulation:Pharmaceutical:Lovenox -antiplatelet therapy: N/A 3. Pain Management:Fentanyl 50 mcg/72 hrs with dilaudid prn for breakthrough pain. 4. Mood:LCSW to follow for evaluation and support. Team to provide ego support. -antipsychotic agents: N/A 5. Neuropsych: This patientiscapable of making decisions on hisown behalf. 6.Multipledegloving injuries with necrosis/Skin/Wound Care:continue local care to ostomy and tube site. Dressings in place for back/buttock areas.  -WOC RN consulted for ongoing follow up, recs for low back  -PRAFO's for bilateral heels 7. Fluids/Electrolytes/Nutrition:Continue NPO status with tube feeds. Doubt that 45 cc/hr providing adequate nutrition. Will consult RD for adjust of tube feeds. 8. Acute on chronic respiratory failure: Decannulated--stable on 3L oxygen per [place]. -stoma closed 9.Recent SIRSdue to enterococcus  BJS:EGBTDVVO 6/7--6/14 and macrodantin thorough 6/13 ---both complete 10.Urethral disruption/ SPCdependent: Monthly change and flush daily and prn to prevent blockage.Marland Kitchengood amount of sediment in catheter today 11. Anemia of chronic disease:  -iron panel c/w with ACD  -improve nutritional status 12. Hypernatremia:  water flushes  400 mg qid  -improving 143 6/13 13. Tachycardia: Continue to monitor HR tid. Lopressor increased to 50 mg tid on 6/9 14.Encephalopathy: On Amantadine and provigil for activation.  15. Anxiety disorder:Currently on multiple medications--Zoloft 25 mg with Seroquel 200 mg tid and Klonopin 0.5 daily in addition to prn Klonopin.  -decrease seroquel to 150mg  and observe  -may be able to reduce meds for stimulation if these meds can be reduced  16. Dysphagia/Gastroparesis: Continue NPO status. MBS today  -vital 1.5 at 8-cc/hr  -RD following 17. Hypokalemia: continue KCL for supplement   -recheck labs tomorrow  -check Mg++ too    LOS: 3 days A FACE TO FACE EVALUATION WAS PERFORMED  Meredith Staggers 11/26/2018, 10:05 AM

## 2018-11-26 NOTE — Progress Notes (Signed)
Modified Barium Swallow Progress Note  Patient Details  Name: LORNE WINKELS MRN: [id number] Date of Birth: Mar 13, 1966  Today's Date: 11/26/2018  Modified Barium Swallow completed.  Full report located under Chart Review in the Imaging Section.  Brief recommendations include the following:  Clinical Impression  Pt presents with mild impairments in oral phase and mild motor deficits during pharyngeal phase of swallow function. Pt's oral phase is c/b mild deficits in bolus management and cohesiveness but effective oral clearing with puree, dysphagia 3, mixed consistencies, nectar thick liquids and thin liquids. Pt's swallow initiation is appropriate, timely and effective in protecting airway. With large consecutive cup and straw sips, pt with mild penetration but was cleared. Across the study, pt had mild to moderate amounts of residue in vallecula and pyriform sinuses that cleared with intermittent double swallow. The residue didn't pose any risk of aspiration. Of note, pt with wet vocal quality and intermittent wet sounding cough, but none were related to any observed aspiration. Recommend return to PO diet including solids and thin liquids, medicine whole in puree and full supervision.   Swallow Evaluation Recommendations       SLP Diet Recommendations: Dysphagia 2 (Fine chop) solids;Thin liquid   Liquid Administration via: Cup;Straw   Medication Administration: Whole meds with puree   Supervision: Patient able to self feed;Full supervision/cueing for compensatory strategies   Compensations: Minimize environmental distractions;Slow rate;Small sips/bites   Postural Changes: Seated upright at 90 degrees   Oral Care Recommendations: Oral care BID        Antion Andres 11/26/2018,3:32 PM

## 2018-11-26 NOTE — Progress Notes (Signed)
Occupational Therapy Session Note  Patient Details  Name: Christopher [name] MRN: [id number] Date of Birth: 02-04-1966  Today's Date: 11/26/2018 OT Individual Time: 3235-5732 OT Individual Time Calculation (min): 40 min    Short Term Goals: Week 1:  OT Short Term Goal 1 (Week 1): Pt will complete 1 grooming task w/c level at sink to increase OOB tolerance OT Short Term Goal 2 (Week 1): Pt will complete LB bathing with Max A of 1 helper  Skilled Therapeutic Interventions/Progress Updates:    Pt seen for OT session focusing on upright tolerance, sitting balance and ROM. Pt in supine upon arrival, agreeable to tx session and denying pain.  He transferred to sitting EOB with max A +1 with multi-modal cuing for sequencing and technique. He required min-mod A overall for unsupported sitting EOB with B UE support. VCs for anterior weight shift to establish balance point. Pt tolerating ~5 minutes of sitting EOB before requsting to return to supine. While seated EOB, L LE remained in extended position, unable to bend to provide support for sitting balance. Mod A to return to supine with HOB elevated as pt receiving feeding.  During supine rest break, pt washed face with set-up, provided with shampoo shower cap and brushed hair with assist for reaching fully in back.  Following rest break, another trial of sitting EOB, transition in same manner as described above. PT able to maintain static sitting balance and B UE support with supervision ~10 seconds before fatiguing and requiring min A. PT tolerating ~3 minutes seated EOB until requesting return to supine.  From supine position, completed L LE AAROM to knee and ankle. Pt remains very tight in all joints, educated provided regarding importance of ROM exercises, will cont to address and educate in future sessions.  Pt left in supine with HOB elevated at end of session, all needs in reach.   Therapy Documentation Precautions:  Precautions Precautions:  Fall, Other (comment) Precaution Comments: PEG, ostomy, SP catheter, multiple healing skin grafts/wounds periarea and B LEs Restrictions Weight Bearing Restrictions: No RLE Weight Bearing: Weight bearing as tolerated LLE Weight Bearing: Weight bearing as tolerated Pain:   No/denies pain   Therapy/Group: Individual Therapy  Brittnei Jagiello L 11/26/2018, 7:06 AM

## 2018-11-26 NOTE — Progress Notes (Signed)
Nutrition Follow-up  RD working remotely.  DOCUMENTATION CODES:   Severe malnutrition in context of chronic illness  INTERVENTION:   Do NOT recommend d/c tube feeds at this time as pt is unlikely to meet high nutritional needs via PO intake alone.  Nocturnal TF via PEG: - Vital 1.5 @ 100 ml/hr x 12 hours (from 7 pm to 7 am) for a total of 1200 ml formula - Continue Pro-stat 60 ml BID per tube - Continue 1 packet of Juven BID per tube - Free water per MD, currently 200 ml QID - Liquid MVI  Nocturnal tube feeding regimen provides 2390 kcal, 146 grams of protein, and 1717 ml of H2O (89% of minimum kcal needs, 100% of minimum protein needs).  - Ensure Enlive po TID, each supplement provides 350 kcal and 20 grams of protein  NUTRITION DIAGNOSIS:   Severe Malnutrition related to chronic illness (trauma, wounds, multiple fractures, dysphagia) as evidenced by severe muscle depletion, severe fat depletion, percent weight loss (16.3% weight loss in 3 months).  Ongoing, being addressed via TF and oral nutrition supplements  GOAL:   Patient will meet greater than or equal to 90% of their needs  Met via TF, oral nutrition supplements  MONITOR:   Labs, I & O's, Weight trends, TF tolerance, Skin, Diet advancement  REASON FOR ASSESSMENT:   Consult Enteral/tube feeding initiation and management  ASSESSMENT:   53 year old male who presented to the ED on 07/10/18 as a level 1 trauma after he was accidentally backed over with a tractor trailer at work. Obvious open wound to left groin and scrotum. Taken to OR for hemorrhage control. Urology consulted for urethral trauma and scrotal degloving. Pt developed abdominal compartment syndrome in OR and underwent decompressive laparotomy. IR performed pelvic arteriogram and embolization of right internal iliac and SP tube placement. Hospital course with ortho consulted recommended operative fixation of pelvis when stabilized and placed in traction  1/29. Plastics consulted 1/29 for complex degloving of left groin/thigh and scrotal degloving. To OR with trauma and orthopedic surgery 1/30 for abdominal washout and pelvic fixation. Pt underwent multiple additional OR procedures during hospitalization. On 2/10 pt had creation of diverting colostomy. On 2/20 pt had tracheostomy. On 3/3 pt had PEG placed. Pt discharged to [place] on 10/16/18 on vent via trach. Eventually weaned from vent and began capping trials on 5/28 with eventual decannulation on 6/5. Pt failed FEES on 6/1 and remains NPO. Admitted to CIR on 6/12.  Diet advanced to Regular with thin liquids this AM after MBS. Discussed with SLP. Pt to have trial Regular tray at lunchtime with SLP but may end up needing Dysphagia 3 textures.  Weight down 3 lbs since admission to CIR.  RD will switch continuous TF to nocturnal TF. RD will also order an oral nutrition supplement.  Current TF: Vital 1.5 @ 80 ml/hr x 20 hours, Pro-stat 60 ml BID, free water 200 ml QID  Medications reviewed and include: SSI, Reglan QID, liquid MVI, Juven BID, Protonix, Klor-con 20 mEq daily  Labs reviewed: potassium 3.3 (6/13), hemoglobin 7.8 CBG's: 97-120 x 24 hours  UOP: 2750 ml x 24 hours Colostomy: 225 ml x 24 hours I/O's: -6.6 L since admit  Diet Order:   Diet Order            Diet regular Room service appropriate? Yes; Fluid consistency: Thin  Diet effective 1000              EDUCATION NEEDS:   No education  needs have been identified at this time  Skin:  Skin Assessment: Skin Integrity Issues: Other: chronic wounds ot back and sacrum/buttock area; s/p A cell grafts to scrotum, perineal skin, and buttocks wounds  Last BM:  11/26/18 225 ml x 24 hours via colostomy  Height:   Ht Readings from Last 1 Encounters:  11/22/18 '6\' 2"'  (1.88 m)    Weight:   Wt Readings from Last 1 Encounters:  11/26/18 69.4 kg    Ideal Body Weight:  86.4 kg  BMI:  Body mass index is 19.64 kg/m.  Estimated  Nutritional Needs:   Kcal:  5035-4656  Protein:  145-170 grams  Fluid:  >2.0 L    Gaynell Face, MS, RD, LDN Inpatient Clinical Dietitian Pager: [phone] Weekend/After Hours: [phone]

## 2018-11-26 NOTE — Progress Notes (Signed)
Physical Therapy Session Note  Patient Details  Name: Christopher [name] MRN: [id number] Date of Birth: March 22, 1966  Today's Date: 11/26/2018 PT Individual Time: 1115-1230 PT Individual Time Calculation (min): 75 min   Short Term Goals: Week 1:  PT Short Term Goal 1 (Week 1): Pt will roll R/L with side rials and mod A with verbal cues. PT Short Term Goal 2 (Week 1): Pt will transfer supine to edge of bed, edge of bed to supine with side rail and mod A. PT Short Term Goal 3 (Week 1): Pt will transfer bed to chair with max A. PT Short Term Goal 4 (Week 1): Pt will propel w/c about 50 feet with min A. PT Short Term Goal 5 (Week 1): Pt will ambulate about 10 feet with rolling walker and max A.  Skilled Therapeutic Interventions/Progress Updates:   Pt resting in bed.  Andree Elk, LPN disconnected feeding tube.  Pt passed his swallowing eval and is now on thin liquids.  +2 for rolling L and sitting up, using bed features.  L knee extension contracture limits positioning in sitting.  Pt tolerated sitting up x 5 minutes with mod assist for balance.  He c/o dizziness, became anxious and SOB; he lay back down on bed with assistance.  PTs provided emotional support and instructed pt in paced breathing with good results.  +2 for moving pt symmetrically up [place]; pt assisted by pushing through RLE and counting 1-2-3.  .    In supine with HOB at 50 degrees, Therapeutic exercises performed with trunk, LEs and UEs to increase strength for functional mobility. Blue bolster under fL knee for sustained stretch x 35 minutes: 10 x 2 cervical flexion, 10 x 1 each: bil glut sets, R active assistive straight leg raises, R ankle pumps with knee flexed/extended, R hip rotation; ; L short arc quad knee extension, L ankle pumps, L hip rotation; R/L shoulder punches with assistance to keep elbow extended.  PT instructed pt in relaxation imagery with good results. At end of session, pt resting in bed with needs at  hand.  Tilt in space w/c left in room for later use.     Therapy Documentation Precautions:  Precautions Precautions: Fall, Other (comment) Precaution Comments: PEG, ostomy, SP catheter, multiple healing skin grafts/wounds periarea and B LEs Restrictions Weight Bearing Restrictions: No RLE Weight Bearing: Weight bearing as tolerated LLE Weight Bearing: Weight bearing as tolerated  Pain: Pain Assessment Pain Scale: 0-10 Pain Score: 7  Faces Pain Scale: Hurts whole lot Pain Type: Acute pain;Other (Comment)(generalized all over) Pain Location: Back premedicated      Therapy/Group: Individual Therapy  Gracemarie Skeet 11/26/2018, 12:41 PM

## 2018-11-26 NOTE — IPOC Note (Signed)
Overall Plan of Care [facility]) Patient Details Name: Christopher [name] MRN: [id number] DOB: January 14, 1966  Admitting Diagnosis: <principal problem not specified>  Hospital Problems: Active Problems:   Debility     Functional Problem List: Nursing Bladder, Bowel, Endurance, Medication Management, Nutrition, Motor, Pain, Skin Integrity, Safety  PT Balance, Endurance, Motor  OT Balance, Endurance, Motor, Pain, Safety, Sensory, Skin Integrity, Cognition  SLP    TR         Basic ADL's: OT Eating, Grooming, Bathing, Dressing, Toileting     Advanced  ADL's: OT Simple Meal Preparation     Transfers: PT Bed Mobility, Bed to Chair, Car  OT Toilet, Tub/Shower     Locomotion: PT Ambulation, Wheelchair Mobility     Additional Impairments: OT Fuctional Use of Upper Extremity  SLP Swallowing, Communication expression    TR      Anticipated Outcomes Item Anticipated Outcome  Self Feeding No goal-NPO  Swallowing  Mod I   Basic self-care  Min-Mod A  Toileting  No goal due to ostomy + catheter   Bathroom Transfers No goal  Bowel/Bladder  manage bladder and bowel with max assist  Transfers  min A transfers  Locomotion  S w/c mobility, mod A gait  Communication  Mod I  Cognition     Pain  at or below level 4  Safety/Judgment  maintain safety with cues/supervision   Therapy Plan: PT Intensity: Minimum of 1-2 x/day ,45 to 90 minutes PT Frequency: 5 out of 7 days PT Duration Estimated Length of Stay: 18 to 21 days OT Intensity: Minimum of 1-2 x/day, 45 to 90 minutes OT Frequency: 5 out of 7 days OT Duration/Estimated Length of Stay: 2.5-3 weeks SLP Intensity: Minumum of 1-2 x/day, 30 to 90 minutes SLP Frequency: 3 to 5 out of 7 days SLP Duration/Estimated Length of Stay: 2.5-3 weeks   Due to the current state of emergency, patients may not be receiving their 3-hours of Medicare-mandated therapy.   Team Interventions: Nursing Interventions Patient/Family Education,  Pain Management, Bladder Management, Medication Management, Discharge Planning, Bowel Management, Skin Care/Wound Management, Disease Management/Prevention, Psychosocial Support, Dysphagia/Aspiration Precaution Training  PT interventions Ambulation/gait training, Discharge planning, DME/adaptive equipment instruction, Functional electrical stimulation, Functional mobility training, Patient/family education, Neuromuscular re-education, Splinting/orthotics, Therapeutic Exercise, Therapeutic Activities, Stair training, UE/LE Strength taining/ROM, UE/LE Coordination activities, Wheelchair propulsion/positioning  OT Interventions Training and development officer, DME/adaptive equipment instruction, Patient/family education, Therapeutic Activities, Wheelchair propulsion/positioning, Cognitive remediation/compensation, Psychosocial support, Therapeutic Exercise, UE/LE Strength taining/ROM, Self Care/advanced ADL retraining, Functional mobility training, Community reintegration, Discharge planning, Neuromuscular re-education, Skin care/wound managment, UE/LE Coordination activities, Pain management, Disease mangement/prevention  SLP Interventions Cueing hierarchy, Patient/family education, Therapeutic Activities, Therapeutic Exercise, Dysphagia/aspiration precaution training  TR Interventions    SW/CM Interventions Discharge Planning, Psychosocial Support, Patient/Family Education   Barriers to Discharge MD  Medical stability  Nursing      PT Inaccessible home environment    OT Medical stability, Wound Care, Nutrition means    SLP      SW       Team Discharge Planning: Destination: PT-Home ,OT- Home , SLP-Home Projected Follow-up: PT-Home health PT, OT-  Home health OT, SLP-24 hour supervision/assistance, Home Health SLP(if swallowing goals are not met) Projected Equipment Needs: PT-To be determined, OT- To be determined, SLP-None recommended by SLP Equipment Details: PT- , OT-  Patient/family involved  in discharge planning: PT- Patient,  OT-Patient, SLP-Patient  MD ELOS: 18-22 days Medical Rehab Prognosis:  Excellent Assessment: The patient has been admitted for CIR  therapies with the diagnosis of polytrauma, left sciatic nerve injury. The team will be addressing functional mobility, strength, stamina, balance, safety, adaptive techniques and equipment, self-care, bowel and bladder mgt, patient and caregiver education, NMR, pain control, swallowing, breathing, cognition. Goals have been set at min to mod assist with mobility and self-care tasks, likely at w/c level, mod I with cognition and swallowing, communication.   Due to the current state of emergency, patients may not be receiving their 3 hours per day of Medicare-mandated therapy.    Meredith Staggers, MD, FAAPMR      See Team Conference Notes for weekly updates to the plan of care

## 2018-11-26 NOTE — Progress Notes (Signed)
Stoma site closed.  Dressing removed and site cleaned.

## 2018-11-26 NOTE — Progress Notes (Signed)
Physical Therapy Session Note  Patient Details  Name: Christopher [name] MRN: [id number] Date of Birth: 04-08-1966  Today's Date: 11/26/2018 PT Individual Time: 1530-1600 PT Individual Time Calculation (min):  30 min   Short Term Goals: Week 1:  PT Short Term Goal 1 (Week 1): Pt will roll R/L with side rials and mod A with verbal cues. PT Short Term Goal 2 (Week 1): Pt will transfer supine to edge of bed, edge of bed to supine with side rail and mod A. PT Short Term Goal 3 (Week 1): Pt will transfer bed to chair with max A. PT Short Term Goal 4 (Week 1): Pt will propel w/c about 50 feet with min A. PT Short Term Goal 5 (Week 1): Pt will ambulate about 10 feet with rolling walker and max A.  Skilled Therapeutic Interventions/Progress Updates:   Pt resting in bed. Pt denies pain at rest, but stated that his  Back bothers him from lying in bed so much.  PT suggested lying on his side.  Pt rolled L with supervision using bed features.  Using long towel roll under pad behind his back, making sure that ostomy bag was out of the way, and pillow between knees, pt was able to get comfortable in this position; HOB around 30 degrees.  In partial L side lying: 2 x 10 cervical flexion, 1 x 10 R hip abduction /clam shells, resisted R hip extension and knee extension.  Pt needed breaks between sets, but did not become anxious.  At end of session, pt resting in partial L side lying, with needs at hand.  PT posted on board over pt's bed:  --pt will count out 1-2-3 as staff moves him in bed -pt can be positioned for comfort in R/L partial side lying     Therapy Documentation Precautions:  Precautions Precautions: Fall, Other (comment) Precaution Comments: PEG, ostomy, SP catheter, multiple healing skin grafts/wounds periarea and B LEs Restrictions Weight Bearing Restrictions: No RLE Weight Bearing: Weight bearing as tolerated LLE Weight Bearing: Weight bearing as tolerated        Therapy/Group: Individual Therapy  Penne Rosenstock 11/26/2018, 5:06 PM

## 2018-11-27 ENCOUNTER — Inpatient Hospital Stay (HOSPITAL_COMMUNITY): Payer: Self-pay | Admitting: Physical Therapy

## 2018-11-27 ENCOUNTER — Inpatient Hospital Stay (HOSPITAL_COMMUNITY): Payer: Self-pay | Admitting: Occupational Therapy

## 2018-11-27 ENCOUNTER — Inpatient Hospital Stay (HOSPITAL_COMMUNITY): Payer: Self-pay | Admitting: Speech Pathology

## 2018-11-27 LAB — BASIC METABOLIC PANEL
Anion gap: 9 (ref 5–15)
BUN: 14 mg/dL (ref 6–20)
CO2: 25 mmol/L (ref 22–32)
Calcium: 9.3 mg/dL (ref 8.9–10.3)
Chloride: 107 mmol/L (ref 98–111)
Creatinine, Ser: 0.37 mg/dL — ABNORMAL LOW (ref 0.61–1.24)
GFR calc Af Amer: >60 mL/min (ref >60)
GFR calc non Af Amer: >60 mL/min (ref >60)
Glucose, Bld: 156 mg/dL — ABNORMAL HIGH (ref 70–99)
Potassium: 2.8 mmol/L — ABNORMAL LOW (ref 3.5–5.1)
Sodium: 141 mmol/L (ref 135–145)

## 2018-11-27 LAB — GLUCOSE, CAPILLARY
Glucose-Capillary: 104 mg/dL — ABNORMAL HIGH (ref 70–99)
Glucose-Capillary: 126 mg/dL — ABNORMAL HIGH (ref 70–99)
Glucose-Capillary: 138 mg/dL — ABNORMAL HIGH (ref 70–99)
Glucose-Capillary: 147 mg/dL — ABNORMAL HIGH (ref 70–99)
Glucose-Capillary: 82 mg/dL (ref 70–99)
Glucose-Capillary: 86 mg/dL (ref 70–99)

## 2018-11-27 LAB — MAGNESIUM: Magnesium: 1.7 mg/dL (ref 1.7–2.4)

## 2018-11-27 MED ORDER — VITAL 1.5 CAL PO LIQD
1000.0000 mL | ORAL | Status: DC
  Administered 2018-11-27 – 2018-12-03 (×7): 1000 mL
  Filled 2018-11-27 (×7): qty 1000

## 2018-11-27 MED ORDER — VITAL 1.5 CAL PO LIQD
1000.0000 mL | ORAL | Status: DC
  Filled 2018-11-27: qty 1000

## 2018-11-27 MED ORDER — POTASSIUM CHLORIDE 20 MEQ PO PACK
40.0000 meq | PACK | Freq: Three times a day (TID) | ORAL | Status: DC
  Administered 2018-11-27 – 2018-12-04 (×21): 40 meq
  Filled 2018-11-27 (×22): qty 2

## 2018-11-27 MED ORDER — FREE WATER
200.0000 mL | Freq: Three times a day (TID) | Status: DC
  Administered 2018-11-27 – 2018-12-03 (×17): 200 mL
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
## 2018-11-27 NOTE — Plan of Care (Signed)
  Problem: RH Expression Communication Goal: LTG Patient will increase speech intelligibility (SLP) Description: LTG: Patient will increase speech intelligibility at word/phrase/conversation level with cues, % of the time (SLP) Flowsheets (Taken 11/27/2018 1621) Level: Conversation level Note: Goals upgraded as pt is at sentence level 11/27/18   Problem: RH Cognition - SLP Goal: RH LTG Patient will demonstrate orientation with cues Description:  LTG:  Patient will demonstrate orientation to person/place/time/situation with cues (SLP)   Flowsheets (Taken 11/27/2018 1621) LTG Patient will demonstrate orientation to: Time LTG: Patient will demonstrate orientation using cueing (SLP): Modified Independent Note: Goals added d/t cognitive deficits 11/27/18   Problem: RH Comprehension Communication Goal: LTG Patient will comprehend basic/complex auditory (SLP) Description: LTG: Patient will comprehend basic/complex auditory information with cues (SLP). Flowsheets (Taken 11/27/2018 1621) LTG: Patient will comprehend: (semi-complex) -- LTG: Patient will comprehend auditory information with cueing (SLP): Minimal Assistance - Patient > 75% Note: Goal added d/t cognitive deficits 11/27/18   Problem: RH Problem Solving Goal: LTG Patient will demonstrate problem solving for (SLP) Description: LTG:  Patient will demonstrate problem solving for basic/complex daily situations with cues  (SLP) Flowsheets (Taken 11/27/2018 1621) LTG: Patient will demonstrate problem solving for (SLP): Basic daily situations LTG Patient will demonstrate problem solving for: Modified Independent Note: Goals added d/t cognitive deficits 11/27/18   Problem: RH Memory Goal: LTG Patient will use memory compensatory aids to (SLP) Description: LTG:  Patient will use memory compensatory aids to recall biographical/new, daily complex information with cues (SLP) Flowsheets (Taken 11/27/2018 1621) LTG: Patient will use memory compensatory  aids to (SLP): Minimal Assistance - Patient > 75% Note: Goals added d/t cognitive deficits 11/27/18   Problem: RH Attention Goal: LTG Patient will demonstrate this level of attention during functional activites (SLP) Description: LTG:  Patient will will demonstrate this level of attention during functional activites (SLP) Flowsheets (Taken 11/27/2018 1621) Patient will demonstrate during cognitive/linguistic activities the attention type of: Selective Patient will demonstrate this level of attention during cognitive/linguistic activities in: Home LTG: Patient will demonstrate this level of attention during cognitive/linguistic activities with assistance of (SLP): Modified Independent Number of minutes patient will demonstrate attention during cognitive/linguistic activities: 45 minutes Note: Goals added d/t cognitive deficits 11/27/18

## 2018-11-27 NOTE — Plan of Care (Signed)
Pt's plan of care adjusted to 15/7 after speaking with care team and discussed with MD in team conference as pt currently unable to tolerate current therapy schedule with OT, PT, and SLP.   
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
## 2018-11-27 NOTE — Progress Notes (Signed)
Occupational Therapy Session Note  Patient Details  Name: Christopher [name] MRN: [id number] Date of Birth: Sep 23, 1965  Today's Date: 11/27/2018 OT Individual Time: 6269-4854 OT Individual Time Calculation (min): 72 min    Short Term Goals: Week 1:  OT Short Term Goal 1 (Week 1): Pt will complete 1 grooming task w/c level at sink to increase OOB tolerance OT Short Term Goal 2 (Week 1): Pt will complete LB bathing with Max A of 1 helper  Skilled Therapeutic Interventions/Progress Updates:    Upon entering the room, pt supine in bed with RN present to change catheter and empty ostomy bag. OT discussing pt's interest, goals, and current situation during nursing care. Pt is agreeable to OT intervention even though he reports fatigue. Supine >sit with max A to EOB. Max cuing for forward weight shift while seated on EOB. Pt progressed from mod A sitting balance to moments of close supervision - CGA while donning pull over shirt. Pt tolerating seated position for 35 minutes while doffing shirt, washing UB, and donning clean shirt with assist to pull down trunk. Pt standing x2 for 45 seconds each time. Pt standing with max A of 1 and second helper repositioning L LE. Pt needing manual facilitation for upright posture and very anxious once up. Pt returning to supine with +2 for safety. OT repositioned pt in bed for comfort and pt utilized suction as needed. Bed rails up for safety and soft call bell placed in from this session. Pt demonstrated use.   Therapy Documentation Precautions:  Precautions Precautions: Fall, Other (comment) Precaution Comments: PEG, ostomy, SP catheter, multiple healing skin grafts/wounds periarea and B LEs Restrictions Weight Bearing Restrictions: No RLE Weight Bearing: Weight bearing as tolerated LLE Weight Bearing: Weight bearing as tolerated General:   Vital Signs: Therapy Vitals Temp: 98.5 F (36.9 C) Temp Source: Oral Pulse Rate: (!) 102 Resp: 19 BP:  116/78 Patient Position (if appropriate): Lying Oxygen Therapy SpO2: 93 % O2 Device: Room Air Pain:   ADL: ADL Eating: (NPO) Grooming: Moderate assistance Upper Body Bathing: Moderate assistance Where Assessed-Upper Body Bathing: Edge of bed Lower Body Bathing: Other (comment)(2 assist) Where Assessed-Lower Body Bathing: Bed level Upper Body Dressing: Moderate assistance Where Assessed-Upper Body Dressing: Edge of bed Lower Body Dressing: Dependent Where Assessed-Lower Body Dressing: Bed level Toileting: Other (Comment)(NA due to ostomy and SP catheter) Tub/Shower Transfer: Not assessed   Therapy/Group: Individual Therapy  Gypsy Decant 11/27/2018, 4:19 PM

## 2018-11-28 ENCOUNTER — Inpatient Hospital Stay (HOSPITAL_COMMUNITY): Payer: Self-pay | Admitting: Physical Therapy

## 2018-11-28 ENCOUNTER — Inpatient Hospital Stay (HOSPITAL_COMMUNITY): Payer: Self-pay

## 2018-11-28 ENCOUNTER — Inpatient Hospital Stay (HOSPITAL_COMMUNITY): Payer: Self-pay | Admitting: Occupational Therapy

## 2018-11-28 LAB — BASIC METABOLIC PANEL
Anion gap: 8 (ref 5–15)
BUN: 11 mg/dL (ref 6–20)
CO2: 28 mmol/L (ref 22–32)
Calcium: 9.5 mg/dL (ref 8.9–10.3)
Chloride: 106 mmol/L (ref 98–111)
Creatinine, Ser: 0.32 mg/dL — ABNORMAL LOW (ref 0.61–1.24)
GFR calc Af Amer: >60 mL/min (ref >60)
GFR calc non Af Amer: >60 mL/min (ref >60)
Glucose, Bld: 136 mg/dL — ABNORMAL HIGH (ref 70–99)
Potassium: 3.6 mmol/L (ref 3.5–5.1)
Sodium: 142 mmol/L (ref 135–145)

## 2018-11-28 LAB — GLUCOSE, CAPILLARY
Glucose-Capillary: 111 mg/dL — ABNORMAL HIGH (ref 70–99)
Glucose-Capillary: 117 mg/dL — ABNORMAL HIGH (ref 70–99)
Glucose-Capillary: 120 mg/dL — ABNORMAL HIGH (ref 70–99)
Glucose-Capillary: 148 mg/dL — ABNORMAL HIGH (ref 70–99)
Glucose-Capillary: 79 mg/dL (ref 70–99)
Glucose-Capillary: 94 mg/dL (ref 70–99)

## 2018-11-28 MED ORDER — TRAMADOL HCL 50 MG PO TABS
50.0000 mg | ORAL_TABLET | Freq: Four times a day (QID) | ORAL | Status: DC | PRN
  Administered 2018-11-28 – 2018-12-06 (×8): 50 mg via ORAL
  Filled 2018-11-28 (×8): qty 1

## 2018-11-28 MED ORDER — METHOCARBAMOL 500 MG PO TABS
500.0000 mg | ORAL_TABLET | Freq: Four times a day (QID) | ORAL | Status: DC | PRN
  Administered 2018-11-28 – 2018-12-13 (×6): 500 mg via ORAL
  Filled 2018-11-28 (×6): qty 1

## 2018-11-28 MED ORDER — TRAMADOL 5 MG/ML ORAL SUSPENSION: 50.0000 mg | Freq: Four times a day (QID) | ORAL | Status: DC | PRN

## 2018-11-28 MED ORDER — BOOST / RESOURCE BREEZE PO LIQD CUSTOM
1.0000 | Freq: Three times a day (TID) | ORAL | Status: DC
  Administered 2018-11-28 – 2018-12-03 (×9): 1 via ORAL

## 2018-11-28 MED ORDER — PRO-STAT SUGAR FREE PO LIQD
60.0000 mL | Freq: Three times a day (TID) | ORAL | Status: DC
  Administered 2018-11-28 – 2018-12-06 (×24): 60 mL
  Filled 2018-11-28 (×24): qty 60

## 2018-11-28 MED ORDER — DM-GUAIFENESIN ER 30-600 MG PO TB12
1.0000 | ORAL_TABLET | Freq: Two times a day (BID) | ORAL | Status: DC
  Administered 2018-11-28 – 2018-12-14 (×31): 1 via ORAL
  Filled 2018-11-28 (×13): qty 1
  Filled 2018-11-28: qty 2
  Filled 2018-11-28 (×18): qty 1

## 2018-11-28 MED ORDER — HYDROCODONE-ACETAMINOPHEN 5-325 MG PO TABS
1.0000 | ORAL_TABLET | ORAL | Status: DC | PRN
  Administered 2018-11-28 – 2018-12-14 (×57): 1 via ORAL
  Filled 2018-11-28 (×58): qty 1

## 2018-11-28 NOTE — Progress Notes (Signed)
Occupational Therapy Session Note  Patient Details  Name: Christopher [name] MRN: [id number] Date of Birth: September 19, 1965  Today's Date: 11/28/2018 OT Individual Time: 2244-9753 OT Individual Time Calculation (min): 70 min    Short Term Goals: Week 1:  OT Short Term Goal 1 (Week 1): Pt will complete 1 grooming task w/c level at sink to increase OOB tolerance OT Short Term Goal 2 (Week 1): Pt will complete LB bathing with Max A of 1 helper  Skilled Therapeutic Interventions/Progress Updates:    Pt received in bed and agreeable to transferring to wc so his bed could be changed out.  Emptied his ostomy pouch as it was filled with air and fluid and emptied his catheter bag that was full.  Just after the ostomy nurse arrived to change out the bag.   With +2 A pt was sat up to EOB with max A. Pt very anxious. Needed encouragement to relax and breath. Pt able to hold sitting balance with light CGA to S.  To transfer to w/c to his R side, stand pivot used with max A of 2, but the transfer was very difficult as his L shoe sticking to floor and not allowing a smooth pivot.   Assisted nursing with changing pt's bed out.  Pt worked on sitting more upright in tilt in space chair for about 15 minutes.     The transfer back to bed went more smoothly.  Pt used R hand and leg to power up from w/c and L hand on bed rail and his L shoe removed so his sock could slide.  Pt continues to need +2 with max A but he was calmer and his body turned more easily.  +2 to move into supine.  Pt in bed with all needs met.    Therapy Documentation Precautions:  Precautions Precautions: Fall, Other (comment) Precaution Comments: PEG, ostomy, SP catheter, multiple healing skin grafts/wounds periarea and B LEs Restrictions Weight Bearing Restrictions: No RLE Weight Bearing: Weight bearing as tolerated LLE Weight Bearing: Weight bearing as tolerated   Pain: pt only c/o pain in legs with mobility      Therapy/Group:  Individual Therapy  [place] 11/28/2018, 12:48 PM

## 2018-11-28 NOTE — Progress Notes (Signed)
Physical Therapy Session Note  Patient Details  Name: Christopher [name] MRN: [id number] Date of Birth: Oct 22, 1965  Today's Date: 11/28/2018 PT Individual Time: 0350-0938 PT Individual Time Calculation (min): 23 min   Short Term Goals: Week 1:  PT Short Term Goal 1 (Week 1): Pt will roll R/L with side rials and mod A with verbal cues. PT Short Term Goal 2 (Week 1): Pt will transfer supine to edge of bed, edge of bed to supine with side rail and mod A. PT Short Term Goal 3 (Week 1): Pt will transfer bed to chair with max A. PT Short Term Goal 4 (Week 1): Pt will propel w/c about 50 feet with min A. PT Short Term Goal 5 (Week 1): Pt will ambulate about 10 feet with rolling walker and max A.  Skilled Therapeutic Interventions/Progress Updates:  Pt received in TIS w/c reporting significant pain in buttock, pt almost tearful. Pt requires assistance for anterior weight shifting for maximove sling placement and pt returns to bed with +2 mechanical lift. Pt unable to tolerate B knee flexion (L worse than R) to allow pt to be positioned in maximove to lie down, instead has to sit EOB & requires +2 for sit>supine. Pt rolled L<>R with +2 assist to allow therapist to place pad under pt. Pt requires +2 to scoot to [facility]. Pt requests to lie on R side and therapist places towel behind back to support pt & pillow between BLE. RN notified of pt's c/o pain and suggested to PA ([name]) and primary PT potential use of CPM machine to address B knee flexion ROM. Pt left in bed with alarm set & call bell in reach.   Therapy Documentation Precautions:  Precautions Precautions: Fall, Other (comment) Precaution Comments: PEG, ostomy, SP catheter, multiple healing skin grafts/wounds periarea and B LEs Restrictions Weight Bearing Restrictions: No RLE Weight Bearing: Weight bearing as tolerated LLE Weight Bearing: Weight bearing as tolerated     Therapy/Group: Individual Therapy  [name] 11/28/2018, 3:39 PM

## 2018-11-28 NOTE — Progress Notes (Signed)
Speech Language Pathology Daily Session Note  Patient Details  Name: Christopher [name] MRN: [id number] Date of Birth: 18-Oct-1965  Today's Date: 11/28/2018 SLP Individual Time: 9483-4758 SLP Individual Time Calculation (min): 41 min  Short Term Goals: Week 1: SLP Short Term Goal 1 (Week 1): Pt will consume current diet with minimal overt s/s of aspiration /dysphagia and Min A cues for use of compensatory swallow strategies. SLP Short Term Goal 1 - Progress (Week 1): Updated due to goal met SLP Short Term Goal 2 (Week 1): Pt will consume trials of dysphagia 3 with minimal overt s/s of aspiration and decreased effort to complete oral phase over 3 sessions prior to diet upgrade. SLP Short Term Goal 3 (Week 1): Pt will demonstrate selective attention to basic task for ~ 30 minutes with Min A cues. SLP Short Term Goal 4 (Week 1): Pt will utilize external memory aids to recall date and salient events with supervision cues. SLP Short Term Goal 5 (Week 1): Pt will complete basic functional problem solving tasks with Min A cues. SLP Short Term Goal 6 (Week 1): Pt will utilize speech intelligibility strategies to describe action pictures with > 90% intelligibility in mildly noisy environment.  Skilled Therapeutic Interventions:Skilled ST services focused on swallowing skills. Pt expressed feeling hot and was visibility sweating, nurse obtained temperature with no noted fever. Pt was physically limited by constant coughing with moderate efficiency to expectorate secretion and remove with suction.  Pt demonstrated recalled of yesterday's ST events with mod A verbal cues. Pt demonstrated 90% intelligibility at sentence level. SLP facilitated facilitated PO intake with breakfast tray dys 2 and thin via cup/straw, no overt s/s aspiration, however PO intake was limited by poor positioning in bed (despirte attempts to reposition) and interruption of coughing episodes. Pt was left in room with call bell within reach  and bed alarm set. ST recommends to continue skilled ST services.      Pain Pain Assessment Pain Score: 0-No pain  Therapy/Group: Individual Therapy  Makenzi Bannister  [facility] 11/28/2018, 3:57 PM
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
## 2018-11-28 NOTE — Progress Notes (Signed)
Physical Therapy Session Note  Patient Details  Name: Christopher [name] MRN: [id number] Date of Birth: March 14, 1966  Today's Date: 11/28/2018 PT Individual Time: 1330-1430 PT Individual Time Calculation (min): 60 min   Short Term Goals: Week 1:  PT Short Term Goal 1 (Week 1): Pt will roll R/L with side rials and mod A with verbal cues. PT Short Term Goal 2 (Week 1): Pt will transfer supine to edge of bed, edge of bed to supine with side rail and mod A. PT Short Term Goal 3 (Week 1): Pt will transfer bed to chair with max A. PT Short Term Goal 4 (Week 1): Pt will propel w/c about 50 feet with min A. PT Short Term Goal 5 (Week 1): Pt will ambulate about 10 feet with rolling walker and max A.  Skilled Therapeutic Interventions/Progress Updates:    Pt received seated in bed, agreeable to PT session. Pt reports some soreness in lower back at rest, not rated and declines intervention. Pt agreeable to trial Vital Go Bed tilt table feature for standing. Semi-reclined BP 114/76. Attempt to bring bed into tilt table position, pt has poor tolerance for end of bed standing platform being raised up to meet his feet. Pt declines to attempt standing with tilt table feature due to poor tolerance for BLE in [place] position on bed due to significant BLE muscle tightness. Supine to sitting EOB with max A with HOB elevated. Slide board transfer bed w/c with max A x 1, min A x 1. Seated BP 123/70 while semi-reclined in TIS w/c. Provided hot pack to L knee for improved muscle relaxation for attempted PROM during next session. Pt left semi-reclined in TIS w/c with needs in reach at end of session.  Therapy Documentation Precautions:  Precautions Precautions: Fall, Other (comment) Precaution Comments: PEG, ostomy, SP catheter, multiple healing skin grafts/wounds periarea and B LEs Restrictions Weight Bearing Restrictions: No RLE Weight Bearing: Weight bearing as tolerated LLE Weight Bearing: Weight bearing as  tolerated    Therapy/Group: Individual Therapy   [name], PT, DPT  11/28/2018, 2:30 PM

## 2018-11-28 NOTE — Progress Notes (Signed)
This chaplain attempted Pt. spiritual care visit honoring the Pt. therapy schedule.  After talking to the Pt. and his RN-Awndrea, this chaplain recognizes the end of the day may not be the best time for Pt. pastoral care.  The RN recommended a morning phone call for the Pt. therapy schedule.  This chaplain appreciates the RN input and is available for F/U spiritual care as needed.

## 2018-11-28 NOTE — Progress Notes (Signed)
[place] PHYSICAL MEDICINE & REHABILITATION PROGRESS NOTE   Subjective/Complaints: Still coughing up some secretions. Feels he has a bit more appetite this morning. Reviewed swallow at length with SLP yesterday in team conference. Having some pooling near vallecula.   ROS: Patient denies fever, rash, sore throat, blurred vision, nausea, vomiting, diarrhea,   shortness of breath or chest pain, joint or back pain, headache, or mood change.    Objective:   No results found. No results for input(s): WBC, HGB, HCT, PLT in the last 72 hours. Recent Labs    11/27/18 0717 11/28/18 0729  NA 141 142  K 2.8* 3.6  CL 107 106  CO2 25 28  GLUCOSE 156* 136*  BUN 14 11  CREATININE 0.37* 0.32*  CALCIUM 9.3 9.5    Intake/Output Summary (Last 24 hours) at 11/28/2018 1235 Last data filed at 11/28/2018 0905 Gross per 24 hour  Intake 440 ml  Output 2208 ml  Net -1768 ml     Physical Exam: Vital Signs Blood pressure 127/75, pulse (!) 111, temperature 97.8 F (36.6 C), temperature source Oral, resp. rate 18, height 6\' 2"  (1.88 m), weight 72.1 kg, SpO2 94 %. Constitutional: No distress . Vital signs reviewed. HEENT: EOMI, oral membranes moist Neck: supple Cardiovascular: RRR without murmur. No JVD    Respiratory: CTA Bilaterally without wheezes or rales. Intermittent, weak cough.     GI: BS +, non-tender, non-distended  Genitourinary:   SPC in place, some debris in cath Musculoskeletal:  Comments: Heel cords remaint ight bilaterally, left still more than right. Left leg tender with attempts at PROM Neurological: He is alert. Oriented. Fair insight and awareness.  UE 3- to 3/5 deltoids, biceps, triceps, wrist/hands 3+/5. RLE 2/5 prox to distal. LLE with trace to 1/5 KE, HF, 0/5 otherwise. Decreased sensation left foot, dorsal and plantar surfaces, leg appears dysesthetic.  seemed to sense pain and light touch more normally in proximal LLE. RLE without obvious sensory findings. DTR's  trace Skin: He isnot diaphoretic. Healed grafts bilateral upper thighs. Extensive scarring, healing road rash along upper to lower back. Low back with scarring, granulation, some associated drainage between granulation,scabs Psychiatric:  More interaactive, anxious     Assessment/Plan: 1. Functional deficits secondary to debility, polytrauma  which require 3+ hours per day of interdisciplinary therapy in a comprehensive inpatient rehab setting.  Physiatrist is providing close team supervision and 24 hour management of active medical problems listed below.  Physiatrist and rehab team continue to assess barriers to discharge/monitor patient progress toward functional and medical goals  Care Tool:  Bathing    Body parts bathed by patient: Left arm, Chest, Abdomen   Body parts bathed by helper: Right arm Body parts n/a: Buttocks   Bathing assist Assist Level: Minimal Assistance - Patient > 75%     Upper Body Dressing/Undressing Upper body dressing   What is the patient wearing?: Pull over shirt    Upper body assist Assist Level: Minimal Assistance - Patient > 75%    Lower Body Dressing/Undressing Lower body dressing    Lower body dressing activity did not occur: Safety/medical concerns What is the patient wearing?: Pants     Lower body assist Assist for lower body dressing: 2 Helpers     Toileting Toileting Toileting Activity did not occur (Clothing management and hygiene only): Safety/medical concerns  Toileting assist       Transfers Chair/bed transfer  Transfers assist  Chair/bed transfer activity did not occur: Safety/medical concerns  Chair/bed transfer assist  level: 2 Helpers     Locomotion Ambulation   Ambulation assist   Ambulation activity did not occur: Safety/medical concerns          Walk 10 feet activity   Assist           Walk 50 feet activity   Assist Walk 50 feet with 2 turns activity did not occur: Safety/medical  concerns         Walk 150 feet activity   Assist Walk 150 feet activity did not occur: Safety/medical concerns         Walk 10 feet on uneven surface  activity   Assist Walk 10 feet on uneven surfaces activity did not occur: Safety/medical concerns         Wheelchair     Assist Will patient use wheelchair at discharge?: Yes Type of Wheelchair: Manual Wheelchair activity did not occur: Safety/medical concerns         Wheelchair 50 feet with 2 turns activity    Assist            Wheelchair 150 feet activity     Assist Wheelchair 150 feet activity did not occur: Safety/medical concerns        Medical Problem List and Plan: 1.Functional and mobility deficitssecondary to major multiple trauma including numerous soft tissue injuries, pelvic fractures, and left sciatic nerve injury -Continue CIR therapies including PT, OT, and SLP  2. Antithrombotics: -DVT/anticoagulation:Pharmaceutical:Lovenox -antiplatelet therapy: N/A 3. Pain Management:Fentanyl 50 mcg/72 hrs with dilaudid prn for breakthrough pain. 4. Mood:LCSW to follow for evaluation and support. Team to provide ego support. -antipsychotic agents: N/A 5. Neuropsych: This patientiscapable of making decisions on hisown behalf. 6.Multipledegloving injuries with necrosis/Skin/Wound Care:continue local care to ostomy and tube site. Dressings in place for back/buttock areas.  -WOC RN following  -PRAFO's for bilateral heels 7. Fluids/Electrolytes/Nutrition:Continue NPO status with tube feeds. Doubt that 45 cc/hr providing adequate nutrition. Will consult RD for adjust of tube feeds. 8. Acute on chronic respiratory failure: Decannulated--stable on 3L oxygen per [place]. -stoma closed  -add scheduled mucinex dm to help with cough and clearance of secretions  -don't think this is a matter of too many secretions,  just weak cough still 9.Recent SIRSdue to enterococcus SAY:[id number] 6/7--6/14 and macrodantin thorough 6/13 ---both complete 10.Urethral disruption/ SPCdependent: Monthly change and flush daily and prn to prevent blockage.Marland Kitchengood amount of sediment in catheter today 11. Anemia of chronic disease:  -iron panel c/w with ACD  -improve nutritional status 12. Hypernatremia:  water flushes continue  -improving 143 6/13 13. Tachycardia: Continue to monitor HR tid. Lopressor increased to 50 mg tid on 6/9 14.Encephalopathy: On Amantadine and provigil for activation.  15. Anxiety disorder:Currently on multiple medications--Zoloft 25 mg with Seroquel and Klonopin 0.5 daily in addition to prn Klonopin.  -decreased seroquel to 150mg  tid with no problems so far.  -may be able to reduce meds for stimulation if these meds can be reduced  16. Dysphagia/Gastroparesis: pt passed MBS for D2/thins.   -TF changed to HS only  -pt c/o feeling too full this morning to eat much by mouth  -decreased TF to 50cc/hr  -ate 25% this morning  -if he doesn't pick up will increase to full rate  -RD following 17. Hypokalemia:up to 3.6 today  -continue repleting  -Mg++ 1.7   -serial labs       LOS: 5 days A FACE TO FACE EVALUATION WAS PERFORMED  Meredith Staggers 11/28/2018, 12:35 PM

## 2018-11-28 NOTE — Consult Note (Signed)
[place] Nurse wound follow up  Reassess chronic wounds to back and sacrum/buttock area.  S/p acell grafts to scrotum, perineal skin and buttocks wounds.  Remaining wound is on left buttocks, with fragile new epithelium to buttocks, scrotum and perienum. Up with therapy multiple times daily.  Continue to turn and reposition every two hours.  Wound type:Trauma, full thickness.  S/p grafting.  Pressure Injury POA: Yes Measurement: Left buttocks:  3 cm x 6 cm scabbed lesion Wound EHM:[id number] Pink andmoist new epithelium to groin, perineum and buttocks.  Drainage (amount, consistency, odor) None noted Periwound:scarring from healed graft sites Dressing procedure/placement/frequency:Continue to cleanse wound to left buttock and apply calcium alginate dressing to open wounds on buttocks.  Cover with dry dressing.  Change M/W/F.   Barrier cream to perineum, scrotum and buttocks. Keep skin clean and dry.  Turn/reposition every two hours.  [place] Nurse ostomy consult note Stoma type/location: LLQ colostomy.  Changed today.  Once discharged, patient anticipates his girlfriend will help with ostomy care.  She has worked in Training and development officer assessment/size: 1 3/8" pink and moist Peristomal assessment: 0.2 cm denuded lesions at 3 and 9 o'clock.  Pouch was leaking when I removed.  Treatment options for stomal/peristomal skin: barrier ring and two piece system Output soft brown stool Ostomy pouching: 2pc. 2 1/4" pouch with barrier ring  Education provided: at home assistance will include [place] intermittently for ostomy teaching.  Patient states his girlfriend can assist. Patient does not have dexterity for self care at this time.  Enrolled patient in [facility] program: No WOC team will assess weekly.  Domenic Moras MSN, RN, FNP-BC [facility]ON Wound, Ostomy, Continence Nurse Pager [phone]

## 2018-11-28 NOTE — Progress Notes (Signed)
Nutrition Follow-up  DOCUMENTATION CODES:   Severe malnutrition in context of chronic illness  INTERVENTION:   Do NOT recommend d/c tube feeds at this time as pt is unlikely to meet high nutritional needs via PO intake alone.  Nocturnal TF via PEG: - Vital 1.5 @ 50 ml/hr x 12 hours (from 7 pm to 7 am) for a total of 600 ml formula - Pro-stat 60 ml TID per tube - Continue 1 packet of Juven BID per tube - Free water per MD, currently 200 ml q 8 hours - Liquid MVI  Nocturnal tube feeding regimen and current free water provides 1690 kcal, 136 grams of protein, and 1058 ml of H2O (63% of minimum kcal needs, 94% of minimum protein needs).  - d/c Ensure Enlive  - Boost Breeze po TID, each supplement provides 250 kcal and 9 grams of protein  NUTRITION DIAGNOSIS:   Severe Malnutrition related to chronic illness (trauma, wounds, multiple fractures, dysphagia) as evidenced by severe muscle depletion, severe fat depletion, percent weight loss (16.3% weight loss in 3 months).  Ongoing, being addressed via TF and oral nutrition supplements  GOAL:   Patient will meet greater than or equal to 90% of their needs  Progressing  MONITOR:   Labs, I & O's, Weight trends, TF tolerance, Skin, Diet advancement  REASON FOR ASSESSMENT:   Consult Enteral/tube feeding initiation and management  ASSESSMENT:   53 year old male who presented to the ED on 07/10/18 as a level 1 trauma after he was accidentally backed over with a tractor trailer at work. Obvious open wound to left groin and scrotum. Taken to OR for hemorrhage control. Urology consulted for urethral trauma and scrotal degloving. Pt developed abdominal compartment syndrome in OR and underwent decompressive laparotomy. IR performed pelvic arteriogram and embolization of right internal iliac and SP tube placement. Hospital course with ortho consulted recommended operative fixation of pelvis when stabilized and placed in traction 1/29.  Plastics consulted 1/29 for complex degloving of left groin/thigh and scrotal degloving. To OR with trauma and orthopedic surgery 1/30 for abdominal washout and pelvic fixation. Pt underwent multiple additional OR procedures during hospitalization. On 2/10 pt had creation of diverting colostomy. On 2/20 pt had tracheostomy. On 3/3 pt had PEG placed. Pt discharged to [facility] on 10/16/18 on vent via trach. Eventually weaned from vent and began capping trials on 5/28 with eventual decannulation on 6/5. Pt failed FEES on 6/1 and remains NPO. Admitted to CIR on 6/12.  6/15 - MBS with recommendations for Regular diet 6/16 - diet downgraded to Dysphagia 2 for energy conservation  Spoke with pt at bedside. Pt indicates he ate "okay" at breakfast this morning. Pt states he is trying to eat the best that he can. Pt reports he cannot drink the Ensure Enlive oral nutrition supplements. RD to d/c. Pt is willing to try Boost Breeze.  Pt denies any N/V or abdominal pain at this time. Pt appears anxious during conversation with RD.  Weight fairly stable with fluctuations between 152-159 lbs since admit to CIR.  Noted MD decreased nocturnal TF rate by half due to pt complaining of fullness in the morning.  Current TF: Vital 1.5 @ 50 ml/hr x 12 hours, Pro-stat 60 ml BID, free water 200 ml q 8 hours  Meal Completion: 10-25% x last 5 meals  Medications reviewed and include: SSI, Reglan 5 mg QID, liquid MVI, Juven BID per tube, Protonix, Miralax, Klor-con 40 mEq TID  Labs reviewed. CBG's: 82-148 x 24 hours  UOP: 2458 ml x 24 hours Colostomy: 80 ml x 24 hours I/O's: -9.8 L since admit  Diet Order:   Diet Order            DIET DYS 2 Room service appropriate? Yes; Fluid consistency: Thin  Diet effective now              EDUCATION NEEDS:   No education needs have been identified at this time  Skin:  Skin Assessment: Skin Integrity Issues: Other: chronic wounds to back, thighs, and sacrum/buttock area;  s/p A cell grafts to scrotum, perineal skin, and buttocks wounds  Last BM:  11/28/18 colostomy  Height:   Ht Readings from Last 1 Encounters:  11/28/18 6\' 2"  (1.88 m)    Weight:   Wt Readings from Last 1 Encounters:  11/28/18 72.1 kg    Ideal Body Weight:  86.4 kg  BMI:  Body mass index is 20.41 kg/m.  Estimated Nutritional Needs:   Kcal:  2700-2900  Protein:  145-170 grams  Fluid:  >2.0 L    Earma ReadingKate Jablonski Michaelanthony Kempton, MS, RD, LDN Inpatient Clinical Dietitian Pager: [phone] Weekend/After Hours: [phone]

## 2018-11-28 NOTE — Progress Notes (Signed)
   11/28/18 0800  Clinical Encounter Type  Visited With Patient;Health care provider  Visit Type Follow-up  Referral From Other (Comment) (SLP)   Responded to consult from SLP, pt is currently doing ST.  Gathered open times to refer to day shift for return.  Myra Gianotti resident, [phone]

## 2018-11-29 ENCOUNTER — Inpatient Hospital Stay (HOSPITAL_COMMUNITY): Payer: Self-pay | Admitting: Speech Pathology

## 2018-11-29 ENCOUNTER — Inpatient Hospital Stay (HOSPITAL_COMMUNITY): Payer: Self-pay | Admitting: Physical Therapy

## 2018-11-29 ENCOUNTER — Inpatient Hospital Stay (HOSPITAL_COMMUNITY): Payer: Self-pay

## 2018-11-29 LAB — BASIC METABOLIC PANEL
Anion gap: 10 (ref 5–15)
BUN: 15 mg/dL (ref 6–20)
CO2: 23 mmol/L (ref 22–32)
Calcium: 9.9 mg/dL (ref 8.9–10.3)
Chloride: 105 mmol/L (ref 98–111)
Creatinine, Ser: 0.36 mg/dL — ABNORMAL LOW (ref 0.61–1.24)
GFR calc Af Amer: >60 mL/min (ref >60)
GFR calc non Af Amer: >60 mL/min (ref >60)
Glucose, Bld: 135 mg/dL — ABNORMAL HIGH (ref 70–99)
Potassium: 4.2 mmol/L (ref 3.5–5.1)
Sodium: 138 mmol/L (ref 135–145)

## 2018-11-29 LAB — GLUCOSE, CAPILLARY
Glucose-Capillary: 101 mg/dL — ABNORMAL HIGH (ref 70–99)
Glucose-Capillary: 116 mg/dL — ABNORMAL HIGH (ref 70–99)
Glucose-Capillary: 136 mg/dL — ABNORMAL HIGH (ref 70–99)
Glucose-Capillary: 80 mg/dL (ref 70–99)
Glucose-Capillary: 94 mg/dL (ref 70–99)
Glucose-Capillary: 97 mg/dL (ref 70–99)

## 2018-11-29 LAB — CBC
HCT: 31.3 % — ABNORMAL LOW (ref 39.0–52.0)
Hemoglobin: 9.3 g/dL — ABNORMAL LOW (ref 13.0–17.0)
MCH: 33 pg (ref 26.0–34.0)
MCHC: 29.7 g/dL — ABNORMAL LOW (ref 30.0–36.0)
MCV: 111 fL — ABNORMAL HIGH (ref 80.0–100.0)
Platelets: 466 10*3/uL — ABNORMAL HIGH (ref 150–400)
RBC: 2.82 MIL/uL — ABNORMAL LOW (ref 4.22–5.81)
RDW: 14.9 % (ref 11.5–15.5)
WBC: 9.9 10*3/uL (ref 4.0–10.5)
nRBC: 0 % (ref 0.0–0.2)

## 2018-11-29 LAB — PATHOLOGIST SMEAR REVIEW: Path Review: 6152020

## 2018-11-29 MED ORDER — MODAFINIL 100 MG PO TABS
50.0000 mg | ORAL_TABLET | Freq: Every day | ORAL | Status: DC
  Administered 2018-11-30: 50 mg
  Filled 2018-11-29: qty 1

## 2018-11-29 MED ORDER — MEGESTROL ACETATE 400 MG/10ML PO SUSP
400.0000 mg | Freq: Two times a day (BID) | ORAL | Status: DC
  Administered 2018-11-29 – 2018-12-14 (×30): 400 mg via ORAL
  Filled 2018-11-29 (×30): qty 10

## 2018-11-29 NOTE — Progress Notes (Signed)
Speech Language Pathology Daily Session Note  Patient Details  Name: Christopher [name] MRN: [id number] Date of Birth: [birth year]  Today's Date: 11/29/2018 SLP Individual Time: 0730-0830 SLP Individual Time Calculation (min): 60 min  Short Term Goals: Week 1: SLP Short Term Goal 1 (Week 1): Pt will consume current diet with minimal overt s/s of aspiration /dysphagia and Min A cues for use of compensatory swallow strategies. SLP Short Term Goal 1 - Progress (Week 1): Updated due to goal met SLP Short Term Goal 2 (Week 1): Pt will consume trials of dysphagia 3 with minimal overt s/s of aspiration and decreased effort to complete oral phase over 3 sessions prior to diet upgrade. SLP Short Term Goal 3 (Week 1): Pt will demonstrate selective attention to basic task for ~ 30 minutes with Min A cues. SLP Short Term Goal 4 (Week 1): Pt will utilize external memory aids to recall date and salient events with supervision cues. SLP Short Term Goal 5 (Week 1): Pt will complete basic functional problem solving tasks with Min A cues. SLP Short Term Goal 6 (Week 1): Pt will utilize speech intelligibility strategies to describe action pictures with > 90% intelligibility in mildly noisy environment.  Skilled Therapeutic Interventions:  Skilled treatment session focused on dysphagia and cognition goals. SLP facilitated session by problem solving positioning in bed. Pt able to communicate effectively with > 100% intelligibility in moderate noisy setting his ideas on how to help reposition himself. Additionally, pt with increased self-feeding and increased overall consumption of breakfast. SLP reached out to RD for alternate ideas to eat such as cheese to provide more protein. Pt with selective attention for ~45 minutes in moderately distracting environment and able to speak above music playing to achieve speech intelligibility at the 2 sentence level for ~ 100% accuracy. MD present when session over.       Pain Pain Assessment Pain Score: 5   Therapy/Group: Individual Therapy  [name] 11/29/2018, 1:31 PM

## 2018-11-29 NOTE — Progress Notes (Addendum)
Physical Therapy Session Note  Patient Details  Name: Christopher [name] MRN: [id number] Date of Birth: 09/04/1965  Today's Date: 11/29/2018 PT Individual Time: 1115-1200 PT Individual Time Calculation (min): 45 min   Short Term Goals: Week 1:  PT Short Term Goal 1 (Week 1): Pt will roll R/L with side rials and mod A with verbal cues. PT Short Term Goal 2 (Week 1): Pt will transfer supine to edge of bed, edge of bed to supine with side rail and mod A. PT Short Term Goal 3 (Week 1): Pt will transfer bed to chair with max A. PT Short Term Goal 4 (Week 1): Pt will propel w/c about 50 feet with min A. PT Short Term Goal 5 (Week 1): Pt will ambulate about 10 feet with rolling walker and max A.  Skilled Therapeutic Interventions/Progress Updates:    Pt received supine in bed with NT present to assist with repositioning, requesting to be scooted up in bed. Assist x 2 to scoot up in bed. Pt reporting 9/10 pain in low back, declines any intervention. Pt declines to perform any OOB mobility this session 2/2 pain. Pt agreeable to bed level stretches. RLE PROM in all available planes of motion to prevent further contracture, pt exhibits severely shortened hamstrings as well as limited ankle and hip ROM. Provided hot pack to LLE x 15 min to improve muscle relaxation prior to PROM. Supine to sidelying on R side for improved comfort with use of towel roll along backside to assist with maintaining position. LLE knee PROM, limited by tightness in skin graft region on thigh with pt reporting "pulsating" sensation with stretching. Education with patient about purpose of PRAFOs to prevent further ROM loss in B feet, pt reports he is not comfortable sleeping in them because they get too hot. Pt left sidelying on R side in bed with needs in reach at end of session.  Therapy Documentation Precautions:  Precautions Precautions: Fall, Other (comment) Precaution Comments: PEG, ostomy, SP catheter, multiple healing skin  grafts/wounds periarea and B LEs Restrictions Weight Bearing Restrictions: No RLE Weight Bearing: Weight bearing as tolerated LLE Weight Bearing: Weight bearing as tolerated    Therapy/Group: Individual Therapy   [name], PT, DPT  11/29/2018, 12:57 PM

## 2018-11-29 NOTE — Progress Notes (Signed)
Occupational Therapy Session Note  Patient Details  Name: Christopher [name] MRN: [id number] Date of Birth: 1966-02-18  Today's Date: 11/29/2018 OT Individual Time: 4944-9675 OT Individual Time Calculation (min): 40 min    Short Term Goals: Week 1:  OT Short Term Goal 1 (Week 1): Pt will complete 1 grooming task w/c level at sink to increase OOB tolerance OT Short Term Goal 2 (Week 1): Pt will complete LB bathing with Max A of 1 helper  Skilled Therapeutic Interventions/Progress Updates:    Pt seen for OT session focusing on functional mobility and upright tolerance. Pt sitting up in bed upon arrival, denied pain at rest and agreeable to tx session. Pt with complaints of L LE and lower back/butt pain throughout session with change in positioning and therapist manual facilitation at L LE, repositioned and rest provided throughout session.  Pants donned total A +2 at bed level, rolling with max A using hospital bed functions.  He transferred to sitting EOB with max A +1. Pt required min-mod A for sitting balance EOB with B UE support. Total A to slide up in bed for better positioning for transfer. Pt unable to complete anterior weight shift required for safe and effective stand/squat pivot. Used sliding board with total A +2 with multi modal cuing for sequencing and technique.  Pt provided extensive education regarding need for pressure relief, recommending every 15 minutes 2/2 pt's severe wounds on sacrum and limited OOB tolerance. Pt voiced understanding. Declined setting alarm on his phone to alert him every 15 minutes, stating he would remember.  Pt taken to nurses station for change of scenery/distraction. Left at nurses station and made charge nurse at station aware of pressure relief schedule.   Therapy Documentation Precautions:  Precautions Precautions: Fall, Other (comment) Precaution Comments: PEG, ostomy, SP catheter, multiple healing skin grafts/wounds periarea and B  LEs Restrictions Weight Bearing Restrictions: No RLE Weight Bearing: Weight bearing as tolerated LLE Weight Bearing: Weight bearing as tolerated   Therapy/Group: Individual Therapy  [name] L 11/29/2018, 7:08 AM

## 2018-11-29 NOTE — Progress Notes (Signed)
[facility] PHYSICAL MEDICINE & REHABILITATION PROGRESS NOTE   Subjective/Complaints: Pt with improved mgt of secretions/decreased. Was changed to Vital Go Bed yesterday and is struggling as pain has increased in low back/buttock area since then. Appetite a little better   ROS: Patient denies fever, rash, sore throat, blurred vision, nausea, vomiting, diarrhea, cough, shortness of breath or chest pain,   headache, or mood change.    Objective:   No results found. Recent Labs    11/29/18 0800  WBC 9.9  HGB 9.3*  HCT 31.3*  PLT 466*   Recent Labs    11/28/18 0729 11/29/18 0800  NA 142 138  K 3.6 4.2  CL 106 105  CO2 28 23  GLUCOSE 136* 135*  BUN 11 15  CREATININE 0.32* 0.36*  CALCIUM 9.5 9.9    Intake/Output Summary (Last 24 hours) at 11/29/2018 0959 Last data filed at 11/29/2018 0837 Gross per 24 hour  Intake 1000 ml  Output 3500 ml  Net -2500 ml     Physical Exam: Vital Signs Blood pressure 124/80, pulse (!) 107, temperature 98.2 F (36.8 C), resp. rate 19, height 6\' 2"  (1.88 m), weight 72.1 kg, SpO2 98 %. Constitutional: Pt appears uncomfortable, anxious Vital signs reviewed. HEENT: EOMI, oral membranes moist Neck: supple Cardiovascular: RRR without murmur. No JVD    Respiratory: CTA Bilaterally without wheezes or rales. Normal effort    GI: BS +, non-tender, non-distended  Genitourinary:   SPC in place, some debris in cath Musculoskeletal:  Comments: heel cords and left>right knee tight, limited PROM, LB tender, better somewhat when I re-positioned bed Neurological: He is alert. Oriented. Fair insight and awareness.  UE 3- to 3/5 deltoids, biceps, triceps, wrist/hands 3+/5. RLE 2/5 prox to distal. LLE with trace to 1/5 KE, HF, 0/5 otherwise. Decreased sensation left foot, dorsal and plantar surfaces, leg appears dysesthetic.  Senses pain and light touch more normally in proximal LLE. RLE without obvious sensory findings. DTR's trace Skin: He isnot  diaphoretic. Healed grafts bilateral upper thighs. Scarring, granulation.   Psychiatric:  anxious     Assessment/Plan: 1. Functional deficits secondary to debility, polytrauma  which require 3+ hours per day of interdisciplinary therapy in a comprehensive inpatient rehab setting.  Physiatrist is providing close team supervision and 24 hour management of active medical problems listed below.  Physiatrist and rehab team continue to assess barriers to discharge/monitor patient progress toward functional and medical goals  Care Tool:  Bathing    Body parts bathed by patient: Left arm, Chest, Abdomen   Body parts bathed by helper: Right arm Body parts n/a: Buttocks   Bathing assist Assist Level: Minimal Assistance - Patient > 75%     Upper Body Dressing/Undressing Upper body dressing   What is the patient wearing?: Pull over shirt    Upper body assist Assist Level: Minimal Assistance - Patient > 75%    Lower Body Dressing/Undressing Lower body dressing    Lower body dressing activity did not occur: Safety/medical concerns What is the patient wearing?: Pants     Lower body assist Assist for lower body dressing: 2 Helpers     Toileting Toileting Toileting Activity did not occur Landscape architect and hygiene only): Safety/medical concerns  Toileting assist       Transfers Chair/bed transfer  Transfers assist  Chair/bed transfer activity did not occur: Safety/medical concerns  Chair/bed transfer assist level: Dependent - mechanical lift     Locomotion Ambulation   Ambulation assist   Ambulation activity  did not occur: Safety/medical concerns          Walk 10 feet activity   Assist           Walk 50 feet activity   Assist Walk 50 feet with 2 turns activity did not occur: Safety/medical concerns         Walk 150 feet activity   Assist Walk 150 feet activity did not occur: Safety/medical concerns         Walk 10 feet on uneven  surface  activity   Assist Walk 10 feet on uneven surfaces activity did not occur: Safety/medical concerns         Wheelchair     Assist Will patient use wheelchair at discharge?: Yes Type of Wheelchair: Manual Wheelchair activity did not occur: Safety/medical concerns         Wheelchair 50 feet with 2 turns activity    Assist            Wheelchair 150 feet activity     Assist Wheelchair 150 feet activity did not occur: Safety/medical concerns        Medical Problem List and Plan: 1.Functional and mobility deficitssecondary to major multiple trauma including numerous soft tissue injuries, pelvic fractures, and left sciatic nerve injury -Continue CIR therapies including PT, OT, and SLP   -don't advise CPM at this time.   -can increase therapy to 3 hr/ 5 days per week 2. Antithrombotics: -DVT/anticoagulation:Pharmaceutical:Lovenox -antiplatelet therapy: N/A 3. Pain Management:Fentanyl 50 mcg/72 hrs with dilaudid prn for breakthrough pain.  -INCREASED PAIN SINCE CHANGE OF BED. IF CONTINUES, WILL NEED TO LOOK AT GOING BACK TO PRIOR BED SET UP 4. Mood:LCSW to follow for evaluation and support. Team to provide ego support. -antipsychotic agents: N/A  5. Neuropsych: This patientiscapable of making decisions on hisown behalf. 6.Multipledegloving injuries with necrosis/Skin/Wound Care:continue local care to ostomy and tube site. Dressings in place for back/buttock areas.  -WOC RN following  -PRAFO's for bilateral heels 7. Fluids/Electrolytes/Nutrition: D2 thins +nocturnal TF 50 cc/hr  -add megace for appetite  -continue H2o flushes  -will resume full TF if appetite doesn't pick up further  -ate 75% this morning! 8. Acute on chronic respiratory failure: Decannulated--stable on 3L oxygen per [place]. -stoma closed  -continue scheduled mucinex dm to help with cough and  clearance of secretions  - 9.Recent SIRSdue to enterococcus RUE:[id number]:Cefepime 6/7--6/14 and macrodantin thorough 6/13 ---both complete 10.Urethral disruption/ SPCdependent: Monthly change and flush daily and prn to prevent blockage.Marland Kitchen.good amount of sediment in catheter today 11. Anemia of chronic disease:  -iron panel c/w with ACD  -improve nutritional status 12. Hypernatremia:  water flushes continue  -improving 143 6/13 13. Tachycardia: Continue to monitor HR tid. Lopressor increased to 50 mg tid on 6/9 14.Encephalopathy: On Amantadine and provigil for activation.  15. Anxiety disorder:Currently on multiple medications--Zoloft 25 mg with Seroquel and Klonopin 0.5 daily in addition to prn Klonopin.  -decreased seroquel to 150mg  tid with no problems so far.  -may be able to reduce meds for stimulation if these meds can be reduced  16.  Gastroparesis: reglan 17. Hypokalemia:up to 4.2 6/18  -continue repleting, decrease to BID  -Mg++ 1.7   -recheck tomorrow       LOS: 6 days A FACE TO FACE EVALUATION WAS PERFORMED  Ranelle OysterZachary T Swartz 11/29/2018, 9:59 AM

## 2018-11-30 ENCOUNTER — Inpatient Hospital Stay (HOSPITAL_COMMUNITY): Payer: Self-pay | Admitting: Occupational Therapy

## 2018-11-30 ENCOUNTER — Inpatient Hospital Stay (HOSPITAL_COMMUNITY): Payer: Self-pay | Admitting: Speech Pathology

## 2018-11-30 ENCOUNTER — Inpatient Hospital Stay (HOSPITAL_COMMUNITY): Payer: Self-pay | Admitting: Physical Therapy

## 2018-11-30 LAB — BASIC METABOLIC PANEL
Anion gap: 8 (ref 5–15)
BUN: 18 mg/dL (ref 6–20)
CO2: 26 mmol/L (ref 22–32)
Calcium: 10.3 mg/dL (ref 8.9–10.3)
Chloride: 103 mmol/L (ref 98–111)
Creatinine, Ser: 0.39 mg/dL — ABNORMAL LOW (ref 0.61–1.24)
GFR calc Af Amer: >60 mL/min (ref >60)
GFR calc non Af Amer: >60 mL/min (ref >60)
Glucose, Bld: 151 mg/dL — ABNORMAL HIGH (ref 70–99)
Potassium: 4.2 mmol/L (ref 3.5–5.1)
Sodium: 137 mmol/L (ref 135–145)

## 2018-11-30 LAB — CBC
HCT: 32.5 % — ABNORMAL LOW (ref 39.0–52.0)
Hemoglobin: 9.9 g/dL — ABNORMAL LOW (ref 13.0–17.0)
MCH: 33 pg (ref 26.0–34.0)
MCHC: 30.5 g/dL (ref 30.0–36.0)
MCV: 108.3 fL — ABNORMAL HIGH (ref 80.0–100.0)
Platelets: 539 10*3/uL — ABNORMAL HIGH (ref 150–400)
RBC: 3 MIL/uL — ABNORMAL LOW (ref 4.22–5.81)
RDW: 14.6 % (ref 11.5–15.5)
WBC: 11.1 10*3/uL — ABNORMAL HIGH (ref 4.0–10.5)
nRBC: 0 % (ref 0.0–0.2)

## 2018-11-30 LAB — GLUCOSE, CAPILLARY
Glucose-Capillary: 109 mg/dL — ABNORMAL HIGH (ref 70–99)
Glucose-Capillary: 82 mg/dL (ref 70–99)
Glucose-Capillary: 93 mg/dL (ref 70–99)
Glucose-Capillary: 94 mg/dL (ref 70–99)
Glucose-Capillary: 94 mg/dL (ref 70–99)

## 2018-11-30 MED ORDER — QUETIAPINE FUMARATE 50 MG PO TABS
100.0000 mg | ORAL_TABLET | Freq: Three times a day (TID) | ORAL | Status: DC
  Administered 2018-11-30 – 2018-12-03 (×9): 100 mg
  Filled 2018-11-30 (×9): qty 2

## 2018-11-30 NOTE — Progress Notes (Signed)
Social Work Assessment and Plan  Patient Details  Name: Christopher [name] MRN: [id number] Date of Birth: 05-19-1966  Today's Date: 11/30/2018  Problem List:  Patient Active Problem List   Diagnosis Date Noted  . Debility 11/23/2018  . Acute on chronic respiratory failure with hypoxia ([place])   . Bacteremia due to Pseudomonas   . Chronic pain syndrome   . Multiple traumatic injuries   . Pressure injury of skin 08/13/2018  . Acute hemorrhage 07/13/2018  . Degloving injury of lower leg, initial encounter 07/13/2018  . Urethral injury 07/13/2018  . Scrotal injury, initial encounter 07/13/2018  . Fracture of femoral neck, left ([place]) 07/10/2018  . Multiple fractures of pelvis with unstable disruption of pelvic ring, initial encounter for open fracture ([place]) 07/10/2018   Past Medical History:  Past Medical History:  Diagnosis Date  . Acute on chronic respiratory failure with hypoxia ([place])   . Bacteremia due to Pseudomonas   . Chronic pain syndrome   . Multiple traumatic injuries    Past Surgical History:  Past Surgical History:  Procedure Laterality Date  . APPLICATION OF A-CELL OF BACK N/A 08/06/2018   Procedure: Application Of A-Cell Of Back;  Surgeon: [name], DO;  Location: [facility];  Service: Plastics;  Laterality: N/A;  . APPLICATION OF A-CELL OF EXTREMITY Left 08/06/2018   Procedure: Application Of A-Cell Of Extremity;  Surgeon: [name], DO;  Location: [facility];  Service: Plastics;  Laterality: Left;  . APPLICATION OF WOUND VAC  07/12/2018   Procedure: Application Of Wound Vac to the Left Thigh and Scrotum.;  Surgeon: Shona Needles, MD;  Location: [facility];  Service: Orthopedics;;  . APPLICATION OF WOUND VAC  07/10/2018   Procedure: Application Of Wound Vac;  Surgeon: Clovis Riley, MD;  Location: [facility];  Service: General;;  . COLOSTOMY N/A 07/23/2018   Procedure: COLOSTOMY;  Surgeon: Georganna Skeans, MD;  Location: [facility];  Service: General;  Laterality:  N/A;  . CYSTOSCOPY W/ URETERAL STENT PLACEMENT N/A 07/15/2018   Procedure: RETROGRADE URETHROGRAM;  Surgeon: Franchot Gallo, MD;  Location: [facility];  Service: Urology;  Laterality: N/A;  . ESOPHAGOGASTRODUODENOSCOPY N/A 08/14/2018   Procedure: ESOPHAGOGASTRODUODENOSCOPY (EGD);  Surgeon: Georganna Skeans, MD;  Location: [facility];  Service: General;  Laterality: N/A;  bedside  . FACIAL RECONSTRUCTION SURGERY     X 2--once as a teenager and second time in his 15's  . HIP PINNING,CANNULATED Left 07/12/2018   Procedure: CANNULATED HIP PINNING;  Surgeon: Shona Needles, MD;  Location: [facility];  Service: Orthopedics;  Laterality: Left;  . HIP SURGERY    . I&D EXTREMITY Left 07/25/2018   Procedure: Debridement of buttock, scrotum and left leg, placement of acell and vac;  Surgeon: [name], DO;  Location: [facility];  Service: Plastics;  Laterality: Left;  . I&D EXTREMITY N/A 08/06/2018   Procedure: Debridement of buttock, scrotum and left leg;  Surgeon: [name], DO;  Location: [facility];  Service: Plastics;  Laterality: N/A;  . I&D EXTREMITY N/A 08/13/2018   Procedure: Debridement of buttock, scrotum and left leg, placement of acell and vac;  Surgeon: [name], DO;  Location: [facility];  Service: Plastics;  Laterality: N/A;  90 min, please  . INCISION AND DRAINAGE OF WOUND N/A 07/18/2018   Procedure: Debridement of left leg, buttocks and scrotal wound with placement of acell and Flexiseal;  Surgeon: [name], DO;  Location: [facility];  Service: Plastics;  Laterality: N/A;  . INCISION AND DRAINAGE OF WOUND Left 08/29/2018   Procedure: Debridement of buttock, scrotum and left leg, placement of acell and vac;  Surgeon: [name], DO;  Location: [facility];  Service: Plastics;  Laterality: Left;  75 min, please  . INCISION AND DRAINAGE OF WOUND Bilateral 10/23/2018   Procedure: DEBRIDEMENT OF BUTTOCK,SCROTUM, AND LEG WOUNDS WITH PLACEMENT OF ACELL- BILATERAL 90 MIN;  Surgeon:  [name], DO;  Location: [facility];  Service: Plastics;  Laterality: Bilateral;  . IR ANGIOGRAM PELVIS SELECTIVE OR SUPRASELECTIVE  07/10/2018  . IR ANGIOGRAM PELVIS SELECTIVE OR SUPRASELECTIVE  07/10/2018  . IR ANGIOGRAM SELECTIVE EACH ADDITIONAL VESSEL  07/10/2018  . IR EMBO ART  VEN HEMORR LYMPH EXTRAV  INC GUIDE ROADMAPPING  07/10/2018  . IR US GUIDE BX ASP/DRAIN  07/10/2018  . IR US GUIDE VASC ACCESS RIGHT  07/10/2018  . IR VENO/EXT/UNI LEFT  07/10/2018  . IRRIGATION AND DEBRIDEMENT OF WOUND WITH SPLIT THICKNESS SKIN GRAFT Left 09/19/2018   Procedure: Debridement of gluteal wound with placement of acell to left leg.;  Surgeon: [name], DO;  Location: [facility];  Service: Plastics;  Laterality: Left;  2.5 hours, please  . LAPAROTOMY N/A 07/12/2018   Procedure: EXPLORATORY LAPAROTOMY;  Surgeon: Georganna Skeans, MD;  Location: [facility];  Service: General;  Laterality: N/A;  . LAPAROTOMY N/A 07/15/2018   Procedure: WOUND EXPLORATION; CLOSURE OF ABDOMEN;  Surgeon: Georganna Skeans, MD;  Location: [facility];  Service: General;  Laterality: N/A;  . LAPAROTOMY  07/10/2018   Procedure: Exploratory Laparotomy;  Surgeon: Clovis Riley, MD;  Location: [facility];  Service: General;;  . PEG PLACEMENT N/A 08/14/2018   Procedure: PERCUTANEOUS ENDOSCOPIC GASTROSTOMY (PEG) PLACEMENT;  Surgeon: Georganna Skeans, MD;  Location: [facility];  Service: General;  Laterality: N/A;  . PERCUTANEOUS TRACHEOSTOMY N/A 08/02/2018   Procedure: PERCUTANEOUS TRACHEOSTOMY;  Surgeon: Georganna Skeans, MD;  Location: [facility];  Service: General;  Laterality: N/A;  . RADIOLOGY WITH ANESTHESIA N/A 07/10/2018   Procedure: IR WITH ANESTHESIA;  Surgeon: Sandi Mariscal, MD;  Location: [facility];  Service: Radiology;  Laterality: N/A;  . RADIOLOGY WITH ANESTHESIA Right 07/10/2018   Procedure: Ir With Anesthesia;  Surgeon: Sandi Mariscal, MD;  Location: [facility];  Service: Radiology;  Laterality: Right;  . SCROTAL EXPLORATION N/A 07/15/2018   Procedure:  SCROTUM DEBRIDEMENT;  Surgeon: Franchot Gallo, MD;  Location: [facility];  Service: Urology;  Laterality: N/A;  . SHOULDER SURGERY    . SKIN SPLIT GRAFT Right 09/19/2018   Procedure: Skin Graft Split Thickness;  Surgeon: [name], DO;  Location: [facility];  Service: Plastics;  Laterality: Right;  . SKIN SPLIT GRAFT N/A 10/03/2018   Procedure: Split thickness skin graft to gluteal area with acell placement;  Surgeon: [name], DO;  Location: [facility];  Service: Plastics;  Laterality: N/A;  3 hours, please  . VACUUM ASSISTED CLOSURE CHANGE N/A 07/12/2018   Procedure: ABDOMINAL VACUUM ASSISTED CLOSURE CHANGE and abdominal washout;  Surgeon: Georganna Skeans, MD;  Location: [facility];  Service: General;  Laterality: N/A;  . WOUND DEBRIDEMENT Left 07/23/2018   Procedure: DEBRIDEMENT LEFT BUTTOCK  WOUND;  Surgeon: Georganna Skeans, MD;  Location: [facility];  Service: General;  Laterality: Left;  . WOUND EXPLORATION Left 07/10/2018   Procedure: WOUND EXPLORATION LEFT GROIN;  Surgeon: Clovis Riley, MD;  Location: [facility];  Service: General;  Laterality: Left;   Social History:  reports that he has been  smoking cigarettes. He has a 20.00 pack-year smoking history. He has never used smokeless tobacco. He reports that he does not drink alcohol or use drugs.  Family / Support Systems Marital Status: Single Patient Roles: Partner, Parent, Other (Comment)(employee) Spouse/Significant Other: Beckey Downing - significant other/fiance - [phone] Children: 26 y/o son and 25 y/o dtr Other Supports: Melanie's parents Anticipated Caregiver: fiance Ability/Limitations of Caregiver: works remotely, but to take [employer]; she has been a Quarry manager in the past Caregiver Availability: 24/7 Family Dynamics: supporitve fiance and her family  Social History Preferred language: English Religion: None Read: Yes Write: Yes Employment Status: Employed Name of Employer: Primary school teacher. Return to Work Plans: Pt  is not really worried about returning to work right now. Legal History/Current Legal Issues: Pt's care is being covered by workers' compensation due to accident happening while pt was on the job. Guardian/Conservator: MD has determined that pt is capable of making his own decisions.   Abuse/Neglect Abuse/Neglect Assessment Can Be Completed: Yes Physical Abuse: Denies Verbal Abuse: Denies Sexual Abuse: Denies Exploitation of patient/patient's resources: Denies Self-Neglect: Denies  Emotional Status Pt's affect, behavior and adjustment status: Pt was talkative with CSW, but did show some sadness as he talked with CSW.  He remains hopeful about returning home. Recent Psychosocial Issues: Pt with 5 1/2 month hospitalization. Psychiatric History: none reported, but pt was emotional during CSW's visit Substance Abuse History: none reported  Patient / Family Perceptions, Expectations & Goals Pt/Family understanding of illness & functional limitations: Pt/fiance seem to have a good understanding of pt's condition.  CSW will continue to assess their understanding. Premorbid pt/family roles/activities: Pt likes to spend time with his family.  He also works on his cars and doing things around the house. Anticipated changes in roles/activities/participation: Pt would like to resume this when he is able. Pt/family expectations/goals: Pt just wants to get home.  Community Resources Express Scripts: None Premorbid Home Care/DME Agencies: None Transportation available at discharge: family Resource referrals recommended: Neuropsychology  Discharge Planning Living Arrangements: Spouse/significant other, Children Support Systems: Spouse/significant other, Children, Other relatives, Friends/neighbors Type of Residence: Private residence Insurance Resources: Insurance Case Freight forwarder (specify name)(workers' compensation) Museum/gallery curator Resources: Family Support Financial Screen Referred: Previously  completed Living Expenses: Rent(to own doublewide mobile home) Money Management: Patient, Spouse Does the patient have any problems obtaining your medications?: No Home Management: Pt and fiance share these responsibilites PTA. Patient/Family Preliminary Plans: Pt plans to go home to his home with his fiance to assist him.  Pt may need assistance with caregivers from workers' compensation. Social Work Anticipated Follow Up Needs: HH/OP Expected length of stay: 18 to 21 days  Clinical Impression CSW met with pt to introduce self and role of CSW, as well as to complete assessment.  CSW also met pt's fiance, [name], outside of the hospital to get pt's belongings.  Pt was talkative with CSW and motivated to get better so he can get home.  He became a little emotional as CSW asked how he was coping with everything.  He teared up and stated that he has to "not think about it" so he doesn't get "upset."  CSW offered support and told him that we are here for him.  CSW will refer pt to neuropsychologist upon Dr. Ferne Coe return week of June 29th.  CSW will continue to follow pt and assist as needed.  Audryanna Zurita, Silvestre Mesi 11/30/2018, 11:26 PM

## 2018-11-30 NOTE — Progress Notes (Signed)
[facility] Individual Statement of Services  Patient Name:  [name]  Date:  11/30/2018  Welcome to the [facility].  Our goal is to provide you with an individualized program based on your diagnosis and situation, designed to meet your specific needs.  With this comprehensive rehabilitation program, you will be expected to participate in at least 3 hours of rehabilitation therapies Monday-Friday, with modified therapy programming on the weekends.  Your rehabilitation program will include the following services:  Physical Therapy (PT), Occupational Therapy (OT), Speech Therapy (ST), 24 hour per day rehabilitation nursing, Neuropsychology, Case Management (Social Worker), Rehabilitation Medicine, Nutrition Services and Pharmacy Services  Weekly team conferences will be held on Tuesdays to discuss your progress.  Your Social Worker will talk with you frequently to get your input and to update you on team discussions.  Team conferences with you and your family in attendance may also be held.  Expected length of stay: 18 to 21 days  Overall anticipated outcome:  Minimal assistance/moderate assistance  Depending on your progress and recovery, your program may change. Your Social Worker will coordinate services and will keep you informed of any changes. Your Social Worker's name and contact numbers are listed  below.  The following services may also be recommended but are not provided by the [facility] will be made to provide these services after discharge if needed.  Arrangements include referral to agencies that provide these services.  Your insurance has been verified to be:  [place] Workers' Market researcher primary doctor is:  You do not have one.  Sonia Baller can help you find one, if you  want.  Pertinent information will be shared with your doctor and your insurance company.  Social Worker:  Alfonse Alpers, LCSW  [phone] or ([phone]  Information discussed with and copy given to patient by: Trey Sailors, 11/30/2018, 10:33 PM

## 2018-11-30 NOTE — Progress Notes (Signed)
Social Work Patient ID: Christopher [name], male   DOB: [birth year], 53 y.o.   MRN: [id number]  When CSW went outside to the front of the hospital to meet pt's fiance to get pt's belongings, CSW shared team conference discussion on Wednesday, telling her that team thinks he'll need at least 2 weeks and that [facility] will keep her updated weekly.  Pt already new the timeframe therapists were thinking. CSW will continue to follow and assist as needed.

## 2018-11-30 NOTE — Progress Notes (Signed)
Physical Therapy Session Note  Patient Details  Name: Christopher [name] MRN: [id number] Date of Birth: February 28, 1966  Today's Date: 11/30/2018 PT Individual Time: 0830-0930 PT Individual Time Calculation (min): 60 min   Short Term Goals: Week 1:  PT Short Term Goal 1 (Week 1): Pt will roll R/L with side rials and mod A with verbal cues. PT Short Term Goal 2 (Week 1): Pt will transfer supine to edge of bed, edge of bed to supine with side rail and mod A. PT Short Term Goal 3 (Week 1): Pt will transfer bed to chair with max A. PT Short Term Goal 4 (Week 1): Pt will propel w/c about 50 feet with min A. PT Short Term Goal 5 (Week 1): Pt will ambulate about 10 feet with rolling walker and max A.  Skilled Therapeutic Interventions/Progress Updates:    pt rec'd in bed, c/o significant pain but agreeable to out of bed therapy.  Pt requests to attempt sliding board transfers.  Pt +2 for supine to sit and +2 for sliding board transfers with assist for trunk control due to posterior lean, LE/UE placement, wt shifts.  In TIS w/c pt performs UE AROM with 1# dowel for strengthening and stretching bilat shoulders.  kinetron for LE AAROM with pt able to tolerate 3 x 3 minutes of small ROM with assist from PT for pedals.  Pt then requests to attempt stand pivot transfer back to bed. Pt performs with +2 assist for lifting and wt shifts. Pt left in bed on side for comfort, needs at hand.  Therapy Documentation Precautions:  Precautions Precautions: Fall, Other (comment) Precaution Comments: PEG, ostomy, SP catheter, multiple healing skin grafts/wounds periarea and B LEs Restrictions Weight Bearing Restrictions: Yes RLE Weight Bearing: Weight bearing as tolerated LLE Weight Bearing: Weight bearing as tolerated Pain:  pt c/o 8/10 low back pain throughout session, repositioned as needed   Therapy/Group: Individual Therapy  Gizel Riedlinger 11/30/2018, 9:33 AM

## 2018-11-30 NOTE — Progress Notes (Signed)
Occupational Therapy Weekly Progress Note  Patient Details  Name: Christopher [name] MRN: [id number] Date of Birth: [birth year]  Beginning of progress report period: November 24, 2018 End of progress report period: November 30, 2018  Today's Date: 11/30/2018 OT Individual Time: 1220-1355 OT Individual Time Calculation (min): 95 min  OT Missed Time: 10 min (pt fatigue/pain)   Patient has met 1 of 2 short term goals.  Pt making limited progress towards OT goals. He is most limited by very poor functional activity tolerance, requiring increased time and rest breaks with all basic mobility including bed mobility.  Pt currently requires +2 max-total A overall. Pt with limited tolerance to ROM/ tactile sensation in B LEs L>R with knee contracture and overall tightness/ROM due to skin grafts and prolonged time in bed during extended hospital admission. Goals of care to reduce caregiver burden and d/c home with 24 hr physical assist required.  Education on going with pt regarding POC, directing care, importance of positioning and d/c planning.   Patient continues to demonstrate the following deficits: muscle weakness, decreased cardiorespiratoy endurance, unbalanced muscle activation and decreased coordination, decreased safety awareness and decreased sitting balance, decreased postural control and decreased balance strategies and therefore will continue to benefit from skilled OT intervention to enhance overall performance with BADL and Reduce care partner burden.  Patient progressing toward long term goals..  Plan of care revisions: Cont with POC, however may need to be downgraded in future pending pt progress. .  OT Short Term Goals Week 1:  OT Short Term Goal 1 (Week 1): Pt will complete 1 grooming task w/c level at sink to increase OOB tolerance OT Short Term Goal 1 - Progress (Week 1): Met OT Short Term Goal 2 (Week 1): Pt will complete LB bathing with Max A of 1 helper OT Short Term Goal 2 - Progress  (Week 1): Not met Week 2:  OT Short Term Goal 1 (Week 2): Pt will transfer with max-total A +1 in order to reduce caregiver burden OT Short Term Goal 2 (Week 2): Pt will independently direct care with bed mobility techniques during ADL task. OT Short Term Goal 3 (Week 2): Pt will tolerate sitting EOB/EOM with supervision for 5 minutes in prep for functional tasks/transfer  Skilled Therapeutic Interventions/Progress Updates:     Pt seen for OT session focusing on upright tolerance and self-feeding. 2 scheduled therapy sessions combined in order to promote functional OOB time.  Pt asleep in supine upon arrival, easily awoken and agreeable to tx session.  Pt with complaints of severe pain in lower back and buttock at wound site. Attempted to reposition in bed for comfort. Pt willing to attempt to sit up to eat lunch, however, did not tolerate upright positioning in bed due to LEs hitting foot plate and more intense hip flexion position than pt could tolerate.  Therefore opted to transfer pt to tilt-in-space w/c for improved positioning/tolerance.  He transferred to sitting EOB with ma A +1. Completed max-total A sliding board transfer to w/c with +2 required to place and stabilize equipment.  Pt taken to therapy day room and ate lunch seated in tilt-in-space w/c in slightly reclined position. Pt moaning in pain throughout, however, all attempts to reposition including with pillows and further tilted to relieve pain only caused increase in pain. Pt ate ~50% of meal with increased time and rest breaks required throughout.  He was able to self-feed meal with assist for set-up and increased time.  MD, PA and  RN made aware of pt's intolerance to Vital-Go bed, plans to switch out bed during session.  While awaiting bed, completed L LE AAROM/ prolonged stretch for knee flexion and hip flexion from w/c level. Pt tolerating knee flexion ~120 degrees and able to actively extend out for LAQ completed x3 sets of 5  with prolonged stretch btwn sets. VCs for deep/ pursed lip breathing throughout exercises. Completed hip flexion, pt with very limited tolerance approaching 90 degrees hip flexion.  Pt taken back to room once air beds were interchanged. +2 max A sliding board transfer to EOB. +2 to return to supine. Attempted to position pt in L side-lying, pt not tolerating and requesting R side-lying, positioned in R sidelying for comfort and positioning. Left with all needs in reach and RN present.   Therapy Documentation Precautions:  Precautions Precautions: Fall, Other (comment) Precaution Comments: PEG, ostomy, SP catheter, multiple healing skin grafts/wounds periarea and B LEs Restrictions Weight Bearing Restrictions: Yes RLE Weight Bearing: Weight bearing as tolerated LLE Weight Bearing: Weight bearing as tolerated   Therapy/Group: Individual Therapy  Renelle Stegenga L 11/30/2018, 6:55 AM

## 2018-11-30 NOTE — Progress Notes (Signed)
Speech Language Pathology Weekly Progress and Session Note  Patient Details  Name: Christopher [name] MRN: [id number] Date of Birth: Jan 11, 1966  Beginning of progress report period: November 24, 2018 End of progress report period: November 30, 2018  Today's Date: 11/30/2018 SLP Individual Time: 1030-1100 SLP Individual Time Calculation (min): 30 min  Short Term Goals: Week 1: SLP Short Term Goal 1 (Week 1): Pt will consume current diet with minimal overt s/s of aspiration /dysphagia and Min A cues for use of compensatory swallow strategies. SLP Short Term Goal 1 - Progress (Week 1): Met SLP Short Term Goal 2 (Week 1): Pt will consume trials of dysphagia 3 with minimal overt s/s of aspiration and decreased effort to complete oral phase over 3 sessions prior to diet upgrade. SLP Short Term Goal 2 - Progress (Week 1): Not met SLP Short Term Goal 3 (Week 1): Pt will demonstrate selective attention to basic task for ~ 30 minutes with Min A cues. SLP Short Term Goal 3 - Progress (Week 1): Not met SLP Short Term Goal 4 (Week 1): Pt will utilize external memory aids to recall date and salient events with supervision cues. SLP Short Term Goal 4 - Progress (Week 1): Not met SLP Short Term Goal 5 (Week 1): Pt will complete basic functional problem solving tasks with Min A cues. SLP Short Term Goal 5 - Progress (Week 1): Not met SLP Short Term Goal 6 (Week 1): Pt will utilize speech intelligibility strategies to describe action pictures with > 90% intelligibility in mildly noisy environment. SLP Short Term Goal 6 - Progress (Week 1): Met    New Short Term Goals: Week 2: SLP Short Term Goal 1 (Week 2): Pt will consume trials of dysphagia 3 with minimal overt s/s of aspiration and decreased effort to complete oral phase over 3 sessions prior to diet upgrade. SLP Short Term Goal 2 (Week 2): Pt will demonstrate selective attention to basic task for ~ 30 minutes with Min A cues. SLP Short Term Goal 3 (Week 2):  Pt will utilize external memory aids to recall date and salient events with supervision cues. SLP Short Term Goal 4 (Week 2): Pt will complete basic functional problem solving tasks with Min A cues. SLP Short Term Goal 5 (Week 2): Pt will describe object during barrier task in mildly noisy environment to achieve > 95% intelligibility.  Weekly Progress Updates:     Intensity: Minumum of 1-2 x/day, 30 to 90 minutes Frequency: 3 to 5 out of 7 days Duration/Length of Stay: 2.5-3 weeks Treatment/Interventions: Cueing hierarchy;Patient/family education;Therapeutic Activities;Therapeutic Exercise;Dysphagia/aspiration precaution training;Cognitive remediation/compensation   Daily Session  Skilled Therapeutic Interventions: Skilled treatment session focused cognition. SLP received pt in bed very fatigued from pain presumably caused by new mattress. Pt's ability to sustain attention to task and speech intelligibility was reduced d/t fatigue from pain. Pt able to express his wants/needs but therapy was limited to that task. Attempts to reposition failed in helping with pain.     General    Pain Pain Assessment Pain Score: Asleep  Therapy/Group: Individual Therapy  Christopher [name] 11/30/2018, 1:01 PM

## 2018-11-30 NOTE — Progress Notes (Addendum)
[facility] PHYSICAL MEDICINE & REHABILITATION PROGRESS NOTE   Subjective/Complaints: Pt with improved mgt of secretions/decreased. Was changed to Vital Go Bed yesterday and is struggling as pain has increased in low back/buttock area since then. Appetite a little better   ROS: Patient denies fever, rash, sore throat, blurred vision, nausea, vomiting, diarrhea, cough, shortness of breath or chest pain,   headache, or mood change.    Objective:   No results found. Recent Labs    11/29/18 0800 11/30/18 0612  WBC 9.9 11.1*  HGB 9.3* 9.9*  HCT 31.3* 32.5*  PLT 466* 539*   Recent Labs    11/29/18 0800 11/30/18 0612  NA 138 137  K 4.2 4.2  CL 105 103  CO2 23 26  GLUCOSE 135* 151*  BUN 15 18  CREATININE 0.36* 0.39*  CALCIUM 9.9 10.3    Intake/Output Summary (Last 24 hours) at 11/30/2018 0956 Last data filed at 11/30/2018 0725 Gross per 24 hour  Intake 1080 ml  Output 4800 ml  Net -3720 ml     Physical Exam: Vital Signs Blood pressure 101/75, pulse 91, temperature 98 F (36.7 C), resp. rate 20, height 6\' 2"  (1.88 m), weight 72 kg, SpO2 97 %. Constitutional: Pt appears uncomfortable, anxious Vital signs reviewed. HEENT: EOMI, oral membranes moist Neck: supple Cardiovascular: RRR without murmur. No JVD    Respiratory: CTA Bilaterally without wheezes or rales. Normal effort    GI: BS +, non-tender, non-distended  Genitourinary:   SPC in place, some debris in cath Musculoskeletal:  Comments: heel cords and left>right knee tight, limited PROM, LB tender, better somewhat when I re-positioned bed Neurological: He is alert. Oriented. Fair insight and awareness.  UE 3- to 3/5 deltoids, biceps, triceps, wrist/hands 3+/5. RLE 2/5 prox to distal. LLE with trace to 1/5 KE, HF, 0/5 otherwise. Decreased sensation left foot, dorsal and plantar surfaces, leg appears dysesthetic.  Senses pain and light touch more normally in proximal LLE. RLE without obvious sensory findings. DTR's  trace Skin: He isnot diaphoretic. Healed grafts bilateral upper thighs. Scarring, granulation.   Psychiatric:  anxious     Assessment/Plan: 1. Functional deficits secondary to debility, polytrauma  which require 3+ hours per day of interdisciplinary therapy in a comprehensive inpatient rehab setting.  Physiatrist is providing close team supervision and 24 hour management of active medical problems listed below.  Physiatrist and rehab team continue to assess barriers to discharge/monitor patient progress toward functional and medical goals  Care Tool:  Bathing    Body parts bathed by patient: Left arm, Chest, Abdomen   Body parts bathed by helper: Right arm Body parts n/a: Buttocks   Bathing assist Assist Level: Minimal Assistance - Patient > 75%     Upper Body Dressing/Undressing Upper body dressing   What is the patient wearing?: Pull over shirt    Upper body assist Assist Level: Minimal Assistance - Patient > 75%    Lower Body Dressing/Undressing Lower body dressing    Lower body dressing activity did not occur: Safety/medical concerns What is the patient wearing?: Pants     Lower body assist Assist for lower body dressing: 2 Helpers     Toileting Toileting Toileting Activity did not occur (Clothing management and hygiene only): Safety/medical concerns  Toileting assist       Transfers Chair/bed transfer  Transfers assist  Chair/bed transfer activity did not occur: Safety/medical concerns  Chair/bed transfer assist level: 2 Helpers     Locomotion Ambulation   Ambulation assist  Ambulation activity did not occur: Safety/medical concerns          Walk 10 feet activity   Assist           Walk 50 feet activity   Assist Walk 50 feet with 2 turns activity did not occur: Safety/medical concerns         Walk 150 feet activity   Assist Walk 150 feet activity did not occur: Safety/medical concerns         Walk 10 feet on  uneven surface  activity   Assist Walk 10 feet on uneven surfaces activity did not occur: Safety/medical concerns         Wheelchair     Assist Will patient use wheelchair at discharge?: Yes Type of Wheelchair: Manual Wheelchair activity did not occur: Safety/medical concerns         Wheelchair 50 feet with 2 turns activity    Assist            Wheelchair 150 feet activity     Assist Wheelchair 150 feet activity did not occur: Safety/medical concerns        Medical Problem List and Plan: 1.Functional and mobility deficitssecondary to major multiple trauma including numerous soft tissue injuries, pelvic fractures, and left sciatic nerve injury -Continue CIR therapies including PT, OT, and SLP   -don't advise CPM at this time.   -can increase therapy to 3 hr/ 5 days per week 2. Antithrombotics: -DVT/anticoagulation:Pharmaceutical:Lovenox -antiplatelet therapy: N/A 3. Pain Management:Fentanyl 50 mcg/72 hrs with dilaudid prn for breakthrough pain.  -pain a little better today  -needs repositioning prn for comfort 4. Mood:LCSW to follow for evaluation and support. Team to provide ego support. -antipsychotic agents: N/A  5. Neuropsych: This patientiscapable of making decisions on hisown behalf. 6.Multipledegloving injuries with necrosis/Skin/Wound Care:continue local care to ostomy and tube site. Dressings in place for back/buttock areas.  -WOC RN following  -PRAFO's for bilateral heels 7. Fluids/Electrolytes/Nutrition: D2 thins +nocturnal TF 50 cc/hr  -added megace for appetite  -continue H2o flushes  -appetite showing some improvement but still inconsistent intake  -ate 100% this morning however! 8. Acute on chronic respiratory failure: Decannulated--stable on 3L oxygen per [place]. -stoma closed  -continue scheduled mucinex dm to help with cough and clearance of  secretions---has helped  - 9.Recent SIRSdue to enterococcus [id number] 6/7--6/14 and macrodantin thorough 6/13 ---both complete 10.Urethral disruption/ SPCdependent: Monthly change and flush daily and prn to prevent blockage.Marland Kitchengood amount of sediment in catheter today 11. Anemia of chronic disease:  -iron panel c/w with ACD  -improve nutritional status  -recheck next week 12. Hypernatremia:  water flushes continue  -improving 143 6/13 13. Tachycardia: Continue to monitor HR tid. Lopressor increased to 50 mg tid on 6/9 14.Encephalopathy: On Amantadine and provigil for activation.   -dc provigil 15. Anxiety disorder:Currently on multiple medications--Zoloft 25 mg with Seroquel and Klonopin 0.5 daily in addition to prn Klonopin.  -taper seroquel further to 100mg  tid today  -further reduction as tolerated 16.  Gastroparesis: reglan 17. Hypokalemia: holding at 4.2 today  -continue kdur 40 BID  -recheck Monday       LOS: 7 days A FACE TO [place]  Meredith Staggers 11/30/2018, 9:56 AM

## 2018-12-01 DIAGNOSIS — S3730XS Unspecified injury of urethra, sequela: Secondary | ICD-10-CM

## 2018-12-01 DIAGNOSIS — Z933 Colostomy status: Secondary | ICD-10-CM

## 2018-12-01 DIAGNOSIS — S32810S Multiple fractures of pelvis with stable disruption of pelvic ring, sequela: Secondary | ICD-10-CM

## 2018-12-01 LAB — GLUCOSE, CAPILLARY
Glucose-Capillary: 110 mg/dL — ABNORMAL HIGH (ref 70–99)
Glucose-Capillary: 115 mg/dL — ABNORMAL HIGH (ref 70–99)
Glucose-Capillary: 129 mg/dL — ABNORMAL HIGH (ref 70–99)
Glucose-Capillary: 129 mg/dL — ABNORMAL HIGH (ref 70–99)
Glucose-Capillary: 83 mg/dL (ref 70–99)
Glucose-Capillary: 92 mg/dL (ref 70–99)

## 2018-12-01 NOTE — Progress Notes (Signed)
[facility] PHYSICAL MEDICINE & REHABILITATION PROGRESS NOTE   Subjective/Complaints: Patient with pain around the low back area. Dates that his dressing was not changed yesterday.  He is on a Monday Wednesday Friday schedule.  ROS: Patient denies chest pain, shortness of breath, nausea vomiting diarrhea  Objective:   No results found. Recent Labs    11/29/18 0800 11/30/18 0612  WBC 9.9 11.1*  HGB 9.3* 9.9*  HCT 31.3* 32.5*  PLT 466* 539*   Recent Labs    11/29/18 0800 11/30/18 0612  NA 138 137  K 4.2 4.2  CL 105 103  CO2 23 26  GLUCOSE 135* 151*  BUN 15 18  CREATININE 0.36* 0.39*  CALCIUM 9.9 10.3    Intake/Output Summary (Last 24 hours) at 12/01/2018 1050 Last data filed at 12/01/2018 0700 Gross per 24 hour  Intake 480 ml  Output 5615 ml  Net -5135 ml     Physical Exam: Vital Signs Blood pressure 118/71, pulse 97, temperature 98.1 F (36.7 C), resp. rate 19, height 6\' 2"  (1.88 m), weight 68.9 kg, SpO2 98 %. Constitutional: Pt appears uncomfortable, anxious Vital signs reviewed. HEENT: EOMI, oral membranes moist Neck: supple Cardiovascular: RRR without murmur. No JVD    Respiratory: CTA Bilaterally without wheezes or rales. Normal effort    GI: BS +, non-tender, non-distended, colostomy left lower quadrant with good output Genitourinary:   SPC in place, some debris in cath Musculoskeletal:  Comments: heel cords and left>right knee tight, limited PROM, LB tender, better somewhat when I re-positioned bed Neurological: He is alert. Oriented. Fair insight and awareness.  UE 3- to 3/5 deltoids, biceps, triceps, wrist/hands 3+/5. RLE 2/5 prox to distal. LLE with trace to 1/5 KE, HF, 0/5 otherwise. Decreased sensation left foot, dorsal and plantar surfaces, leg appears dysesthetic.  Senses pain and light touch more normally in proximal LLE. RLE without obvious sensory findings. DTR's traceunchanged Skin: He isnot diaphoretic. Healed grafts bilateral upper  thighs. Scarring, granulation.   Low back area with slough, brownish drainage as well as some serosanguineous drainage foul odor Psychiatric:  anxious     Assessment/Plan: 1. Functional deficits secondary to debility, polytrauma  which require 3+ hours per day of interdisciplinary therapy in a comprehensive inpatient rehab setting.  Physiatrist is providing close team supervision and 24 hour management of active medical problems listed below.  Physiatrist and rehab team continue to assess barriers to discharge/monitor patient progress toward functional and medical goals  Care Tool:  Bathing    Body parts bathed by patient: Left arm, Chest, Abdomen   Body parts bathed by helper: Right arm Body parts n/a: Buttocks   Bathing assist Assist Level: Minimal Assistance - Patient > 75%     Upper Body Dressing/Undressing Upper body dressing   What is the patient wearing?: Pull over shirt    Upper body assist Assist Level: Minimal Assistance - Patient > 75%    Lower Body Dressing/Undressing Lower body dressing    Lower body dressing activity did not occur: Safety/medical concerns What is the patient wearing?: Pants     Lower body assist Assist for lower body dressing: 2 Helpers     Toileting Toileting Toileting Activity did not occur (Clothing management and hygiene only): Safety/medical concerns  Toileting assist       Transfers Chair/bed transfer  Transfers assist  Chair/bed transfer activity did not occur: Safety/medical concerns  Chair/bed transfer assist level: 2 Helpers     Locomotion Ambulation   Ambulation assist  Ambulation activity did not occur: Safety/medical concerns          Walk 10 feet activity   Assist           Walk 50 feet activity   Assist Walk 50 feet with 2 turns activity did not occur: Safety/medical concerns         Walk 150 feet activity   Assist Walk 150 feet activity did not occur: Safety/medical  concerns         Walk 10 feet on uneven surface  activity   Assist Walk 10 feet on uneven surfaces activity did not occur: Safety/medical concerns         Wheelchair     Assist Will patient use wheelchair at discharge?: Yes Type of Wheelchair: Manual Wheelchair activity did not occur: Safety/medical concerns         Wheelchair 50 feet with 2 turns activity    Assist            Wheelchair 150 feet activity     Assist Wheelchair 150 feet activity did not occur: Safety/medical concerns        Medical Problem List and Plan: 1.Functional and mobility deficitssecondary to major multiple trauma including numerous soft tissue injuries, pelvic fractures, and left sciatic nerve injury -Continue CIR therapies including PT, OT, and SLP      -can increase therapy to 3 hr/ 5 days per week 2. Antithrombotics: -DVT/anticoagulation:Pharmaceutical:Lovenox -antiplatelet therapy: N/A 3. Pain Management:Fentanyl 50 mcg/72 hrs with dilaudid prn for breakthrough pain.  -pain a little better today, no severe pain today  -needs repositioning prn for comfort 4. Mood:LCSW to follow for evaluation and support. Team to provide ego support. -antipsychotic agents: N/A  5. Neuropsych: This patientiscapable of making decisions on hisown behalf. 6.Multipledegloving injuries with necrosis/Skin/Wound Care:continue local care to ostomy and tube site. Dressings in place for back/buttock areas.  -WOC RN following  -PRAFO's for bilateral heels Dressing changes to low back buttock area 3 times per week 7. Fluids/Electrolytes/Nutrition: D2 thins +nocturnal TF 50 cc/hr  -added megace for appetite  -continue H2o flushes  -appetite showing some improvement but still inconsistent intake  -ate 100% this morning however! 8. Acute on chronic respiratory failure: Decannulated--stable on 3L oxygen per  [place]. -stoma closed  -continue scheduled mucinex dm to help with cough and clearance of secretions---has helped  - 9.Recent SIRSdue to enterococcus [id number]:Cefepime 6/7--6/14 and macrodantin thorough 6/13 ---both complete 10.Urethral disruption/ SPCdependent: Monthly change and flush daily and prn to prevent blockage.Marland Kitchen.good amount of sediment in catheter today 11. Anemia of chronic disease:  -iron panel c/w with ACD  -improve nutritional status  -recheck next week 12. Hypernatremia:  water flushes continue  -improving 143 6/13 13. Tachycardia: Continue to monitor HR tid. Lopressor increased to 50 mg tid on 6/9 14.Encephalopathy: On Amantadine and provigil for activation.   -dc provigil 15. Anxiety disorder:Currently on multiple medications--Zoloft 25 mg with Seroquel and Klonopin 0.5 daily in addition to prn Klonopin.  -taper seroquel further to 100mg  tid today  -further reduction as tolerated 16.  Gastroparesis: reglan 17. Hypokalemia: holding at 4.2 today  -continue kdur 40 BID  -recheck Monday       LOS: 8 days A FACE TO FACE EVALUATION WAS PERFORMED  Erick Colacendrew E  12/01/2018, 10:50 AM

## 2018-12-02 ENCOUNTER — Inpatient Hospital Stay (HOSPITAL_COMMUNITY): Payer: Self-pay | Admitting: Physical Therapy

## 2018-12-02 LAB — GLUCOSE, CAPILLARY
Glucose-Capillary: 105 mg/dL — ABNORMAL HIGH (ref 70–99)
Glucose-Capillary: 107 mg/dL — ABNORMAL HIGH (ref 70–99)
Glucose-Capillary: 109 mg/dL — ABNORMAL HIGH (ref 70–99)
Glucose-Capillary: 117 mg/dL — ABNORMAL HIGH (ref 70–99)
Glucose-Capillary: 133 mg/dL — ABNORMAL HIGH (ref 70–99)
Glucose-Capillary: 133 mg/dL — ABNORMAL HIGH (ref 70–99)

## 2018-12-02 NOTE — Progress Notes (Signed)
[facility] PHYSICAL MEDICINE & REHABILITATION PROGRESS NOTE   Subjective/Complaints: No issues overnite   ROS: Patient denies chest pain, shortness of breath, nausea vomiting diarrhea  Objective:   No results found. Recent Labs    11/30/18 0612  WBC 11.1*  HGB 9.9*  HCT 32.5*  PLT 539*   Recent Labs    11/30/18 0612  NA 137  K 4.2  CL 103  CO2 26  GLUCOSE 151*  BUN 18  CREATININE 0.39*  CALCIUM 10.3    Intake/Output Summary (Last 24 hours) at 12/02/2018 0942 Last data filed at 12/02/2018 0700 Gross per 24 hour  Intake 840 ml  Output 3895 ml  Net -3055 ml     Physical Exam: Vital Signs Blood pressure 107/68, pulse (!) 103, temperature 98 F (36.7 C), temperature source Oral, resp. rate 18, height 6\' 2"  (1.88 m), weight 62.6 kg, SpO2 98 %. Constitutional: Pt appears uncomfortable, anxious Vital signs reviewed. HEENT: EOMI, oral membranes moist Neck: supple Cardiovascular: RRR without murmur. No JVD    Respiratory: CTA Bilaterally without wheezes or rales. Normal effort    GI: BS +, non-tender, non-distended, colostomy left lower quadrant with good output Genitourinary:   SPC in place, some debris in cath Musculoskeletal:  Comments: heel cords and left>right knee tight, limited PROM, LB tender, better somewhat when I re-positioned bed Neurological: He is alert. Oriented. Fair insight and awareness.  UE 3- to 3/5 deltoids, biceps, triceps, wrist/hands 3+/5. RLE 2/5 prox to distal. LLE with trace to 1/5 KE, HF, 0/5 otherwise. Decreased sensation left foot, dorsal and plantar surfaces, leg appears dysesthetic.  Senses pain and light touch more normally in proximal LLE. RLE without obvious sensory findings. DTR's traceunchanged Skin: He isnot diaphoretic. Healed grafts bilateral upper thighs. Scarring, granulation.   Low back area with slough, brownish drainage as well as some serosanguineous drainage foul odor Psychiatric:   anxious     Assessment/Plan: 1. Functional deficits secondary to debility, polytrauma  which require 3+ hours per day of interdisciplinary therapy in a comprehensive inpatient rehab setting.  Physiatrist is providing close team supervision and 24 hour management of active medical problems listed below.  Physiatrist and rehab team continue to assess barriers to discharge/monitor patient progress toward functional and medical goals  Care Tool:  Bathing    Body parts bathed by patient: Left arm, Chest, Abdomen   Body parts bathed by helper: Right arm Body parts n/a: Buttocks   Bathing assist Assist Level: Minimal Assistance - Patient > 75%     Upper Body Dressing/Undressing Upper body dressing   What is the patient wearing?: Pull over shirt    Upper body assist Assist Level: Minimal Assistance - Patient > 75%    Lower Body Dressing/Undressing Lower body dressing    Lower body dressing activity did not occur: Safety/medical concerns What is the patient wearing?: Pants     Lower body assist Assist for lower body dressing: 2 Helpers     Toileting Toileting Toileting Activity did not occur Press photographer(Clothing management and hygiene only): Safety/medical concerns  Toileting assist       Transfers Chair/bed transfer  Transfers assist  Chair/bed transfer activity did not occur: Safety/medical concerns  Chair/bed transfer assist level: 2 Helpers     Locomotion Ambulation   Ambulation assist   Ambulation activity did not occur: Safety/medical concerns          Walk 10 feet activity   Assist  Walk 50 feet activity   Assist Walk 50 feet with 2 turns activity did not occur: Safety/medical concerns         Walk 150 feet activity   Assist Walk 150 feet activity did not occur: Safety/medical concerns         Walk 10 feet on uneven surface  activity   Assist Walk 10 feet on uneven surfaces activity did not occur: Safety/medical  concerns         Wheelchair     Assist Will patient use wheelchair at discharge?: Yes Type of Wheelchair: Manual Wheelchair activity did not occur: Safety/medical concerns         Wheelchair 50 feet with 2 turns activity    Assist            Wheelchair 150 feet activity     Assist Wheelchair 150 feet activity did not occur: Safety/medical concerns        Medical Problem List and Plan: 1.Functional and mobility deficitssecondary to major multiple trauma including numerous soft tissue injuries, pelvic fractures, and left sciatic nerve injury -Continue CIR therapies including PT, OT, and SLP      -can increase therapy to 3 hr/ 5 days per week 2. Antithrombotics: -DVT/anticoagulation:Pharmaceutical:Lovenox -antiplatelet therapy: N/A 3. Pain Management:Fentanyl 50 mcg/72 hrs with dilaudid prn for breakthrough pain.  -pain a little better today, no severe pain today  -needs repositioning prn for comfort 4. Mood:LCSW to follow for evaluation and support. Team to provide ego support. -antipsychotic agents: N/A  5. Neuropsych: This patientiscapable of making decisions on hisown behalf. 6.Multipledegloving injuries with necrosis/Skin/Wound Care:continue local care to ostomy and tube site. Dressings in place for back/buttock areas.  -WOC RN following  -PRAFO's for bilateral heels Dressing changes to low back buttock area 3 times per week 7. Fluids/Electrolytes/Nutrition: D2 thins +nocturnal TF 50 cc/hr  -added megace for appetite  -continue H2o flushes  -appetite showing some improvement but still inconsistent intake 25-75%  - 8. Acute on chronic respiratory failure: Decannulated--stable on 3L oxygen per [place]. -stoma closed  -continue scheduled mucinex dm to help with cough and clearance of secretions---has helped  -add flutter valve to help mobilize secretions and IS to  increase resp volume  9.Recent SIRSdue to enterococcus ZOX:[id number] 6/7--6/14 and macrodantin thorough 6/13 ---both complete 10.Urethral disruption/ SPCdependent: Monthly change and flush daily and prn to prevent blockage.Marland Kitchengood amount of sediment in catheter today 11. Anemia of chronic disease:  -iron panel c/w with ACD  -improve nutritional status  -recheck next week 12. Hypernatremia:  water flushes continue  -improving 143 6/13 13. Tachycardia: Continue to monitor HR tid. Lopressor increased to 50 mg tid on 6/9 14.Encephalopathy: On Amantadine and provigil for activation.   -dc provigil 15. Anxiety disorder:Currently on multiple medications--Zoloft 25 mg with Seroquel and Klonopin 0.5 daily in addition to prn Klonopin.  -taper seroquel further to 100mg  tid today  -further reduction as tolerated 16.  Gastroparesis: reglan 17. Hypokalemia: holding at 4.2 today  -continue kdur 40 BID  -recheck Monday       LOS: 9 days A FACE TO [place] E Berma Harts 12/02/2018, 9:42 AM

## 2018-12-02 NOTE — Plan of Care (Signed)
Discharging gait goal due to lack of progress and goal is no longer deemed appropriate for pt to achieve.

## 2018-12-02 NOTE — Progress Notes (Signed)
Physical Therapy Weekly Progress Note  Patient Details  Name: Christopher [name] MRN: [id number] Date of Birth: 06/14/1965  Beginning of progress report period: November 24, 2018 End of progress report period: December 02, 2018  Today's Date: 12/02/2018 PT Individual Time: 0900-1000 PT Individual Time Calculation (min): 60 min   Patient has met 1 of 5 short term goals.  Pt has made limited progress towards PT goals due to poor activity tolerance and increased time/rest breaks needed to complete tasks. Pt is able to complete rolling in bed with min to mod A and use of bedrails. Pt continues to require max A x 2 for supine to/from sit and assist x 2 for transfers bed to/from via slide board, stand pivot, or dependently via use of Maxi Move. Pt typically tolerates sitting up in TIS x 30 min before onset of fatigue and low back pain limiting further tolerance. Pt exhibits poor tolerance for WBing through BLE in sitting or standing position due to hip and knee contractures from skin grafts and prolonged time in bed during his extended hospital stay.  Patient continues to demonstrate the following deficits muscle weakness and muscle joint tightness, decreased cardiorespiratoy endurance and decreased sitting balance, decreased standing balance, decreased postural control and decreased balance strategies and therefore will continue to benefit from skilled PT intervention to increase functional independence with mobility.  Patient not progressing toward long term goals.  See goal revision..  Plan of care revisions: discharged gait goal as it is no longer appropriate for the pt based on his currently level of mobility.  PT Short Term Goals Week 1:  PT Short Term Goal 1 (Week 1): Pt will roll R/L with side rials and mod A with verbal cues. PT Short Term Goal 1 - Progress (Week 1): Met PT Short Term Goal 2 (Week 1): Pt will transfer supine to edge of bed, edge of bed to supine with side rail and mod A. PT Short  Term Goal 2 - Progress (Week 1): Progressing toward goal PT Short Term Goal 3 (Week 1): Pt will transfer bed to chair with max A. PT Short Term Goal 3 - Progress (Week 1): Progressing toward goal PT Short Term Goal 4 (Week 1): Pt will propel w/c about 50 feet with min A. PT Short Term Goal 4 - Progress (Week 1): Not met PT Short Term Goal 5 (Week 1): Pt will ambulate about 10 feet with rolling walker and max A. PT Short Term Goal 5 - Progress (Week 1): Discontinued (comment)(gait goal is not appropriate for the pt at this time) Week 2:  PT Short Term Goal 1 (Week 2): Pt will complete least restrictive transfer with max A x 1 PT Short Term Goal 2 (Week 2): Pt will initiate w/c mobility PT Short Term Goal 3 (Week 2): Pt will tolerate sitting OOB in w/c x 1 hour  Skilled Therapeutic Interventions/Progress Updates:    Pt received seated in bed, agreeable to PT session. Pt reports pain is controlled at rest. Supine to sitting EOB with max A x 2. Slide board transfer bed to w/c with mod A x 2. Pt is setup A to attempt shaving while seated in TIS w/c at sink. Pt is able to use BUE wet his face with a washcloth, rub shaving cream on his beard, and then attempt to shave with a razor. Pt unable to fully shave due to thickness of beard. Pt is total A to trim his beard with scissors to due limited visibility  and ability to see himself in the mirror based on w/c postioning. Pt fatigues quickly and has onset of low back pain while seated up in chair and requests to return to bed before done shaving. Stand pivot transfer back to bed with max A x 2. Sit to supine total A x 2. Pt left sidelying on R side in bed with needs in reach at end of session. Pt reporting increase in congestion and frequently coughing up mucous and requiring suction throughout session. Per RN pt has received his Mucinex this AM.   Therapy Documentation Precautions:  Precautions Precautions: Fall, Other (comment) Precaution Comments: PEG,  ostomy, SP catheter, multiple healing skin grafts/wounds periarea and B LEs Restrictions Weight Bearing Restrictions: No RLE Weight Bearing: Weight bearing as tolerated LLE Weight Bearing: Weight bearing as tolerated   Therapy/Group: Individual Therapy   [name], PT, DPT  12/02/2018, 12:04 PM

## 2018-12-03 ENCOUNTER — Inpatient Hospital Stay (HOSPITAL_COMMUNITY): Payer: Self-pay

## 2018-12-03 ENCOUNTER — Inpatient Hospital Stay (HOSPITAL_COMMUNITY): Payer: Self-pay | Admitting: Physical Therapy

## 2018-12-03 ENCOUNTER — Telehealth: Payer: Self-pay

## 2018-12-03 ENCOUNTER — Inpatient Hospital Stay (HOSPITAL_COMMUNITY): Payer: Self-pay | Admitting: Occupational Therapy

## 2018-12-03 LAB — BASIC METABOLIC PANEL
Anion gap: 9 (ref 5–15)
BUN: 26 mg/dL — ABNORMAL HIGH (ref 6–20)
CO2: 23 mmol/L (ref 22–32)
Calcium: 10.7 mg/dL — ABNORMAL HIGH (ref 8.9–10.3)
Chloride: 104 mmol/L (ref 98–111)
Creatinine, Ser: 0.42 mg/dL — ABNORMAL LOW (ref 0.61–1.24)
GFR calc Af Amer: >60 mL/min (ref >60)
GFR calc non Af Amer: >60 mL/min (ref >60)
Glucose, Bld: 96 mg/dL (ref 70–99)
Potassium: 4.6 mmol/L (ref 3.5–5.1)
Sodium: 136 mmol/L (ref 135–145)

## 2018-12-03 LAB — GLUCOSE, CAPILLARY
Glucose-Capillary: 106 mg/dL — ABNORMAL HIGH (ref 70–99)
Glucose-Capillary: 110 mg/dL — ABNORMAL HIGH (ref 70–99)
Glucose-Capillary: 113 mg/dL — ABNORMAL HIGH (ref 70–99)
Glucose-Capillary: 128 mg/dL — ABNORMAL HIGH (ref 70–99)
Glucose-Capillary: 140 mg/dL — ABNORMAL HIGH (ref 70–99)
Glucose-Capillary: 89 mg/dL (ref 70–99)
Glucose-Capillary: 92 mg/dL (ref 70–99)

## 2018-12-03 MED ORDER — FREE WATER
250.0000 mL | Freq: Four times a day (QID) | Status: DC
  Administered 2018-12-03 – 2018-12-07 (×15): 250 mL

## 2018-12-03 MED ORDER — QUETIAPINE FUMARATE 50 MG PO TABS
100.0000 mg | ORAL_TABLET | Freq: Two times a day (BID) | ORAL | Status: DC
  Administered 2018-12-03 – 2018-12-05 (×4): 100 mg
  Filled 2018-12-03 (×4): qty 2

## 2018-12-03 NOTE — Progress Notes (Signed)
Congested cough-+Rhonchi and expiratory to upper lobes and rhonchi to lower lobes. Congested cough, thin white sputum, using [place]. Sweats when having coughing fits. Ostomy burped 2 times during night, with small mushy stools. Suprapubic cath patent. Patrici Ranks A

## 2018-12-03 NOTE — Progress Notes (Signed)
Speech Language Pathology Daily Session Note  Patient Details  Name: Christopher [name] MRN: [id number] Date of Birth: 1965-06-20  Today's Date: 12/03/2018 SLP Individual Time: 0805-0900 SLP Individual Time Calculation (min): 55 min  Short Term Goals: Week 2: SLP Short Term Goal 1 (Week 2): Pt will consume trials of dysphagia 3 with minimal overt s/s of aspiration and decreased effort to complete oral phase over 3 sessions prior to diet upgrade. SLP Short Term Goal 2 (Week 2): Pt will demonstrate selective attention to basic task for ~ 30 minutes with Min A cues. SLP Short Term Goal 3 (Week 2): Pt will utilize external memory aids to recall date and salient events with supervision cues. SLP Short Term Goal 4 (Week 2): Pt will complete basic functional problem solving tasks with Min A cues. SLP Short Term Goal 5 (Week 2): Pt will describe object during barrier task in mildly noisy environment to achieve > 95% intelligibility.  Skilled Therapeutic Interventions: Skilled ST services focused on cognitive skills. Pt had not received breakfast piror to ST session, therefore SLP provided tray and assistance of dys 2 and thin via straw. Pt required supervision A for problem solving navigating tray. Pt was able to utilize knife to assist in scooping food onto spoon after initial verbal cue. SLP facilitated recall strategies visualization and repetition in short term delayed recall in 5 minute intervals, pt demonstrated recall of 4 out 5 words on initial trial and recalled 2 out 5 words with category cues on second trial. SLP attempted to assess candidacy for RMT to improve strength of cough and vocal intensity, however utilizing EMST device set at highest setting (20 cm  H2O) for this particular device, pt reported 4 out 10 effortful level following x10 repetitions, indicting device did not provided enough resistance. Pt demonstrated overall improvement in vocal intensity and is appropriate on a thin liquid  diet, therefore SLP suggest to no immediate need for further RMT trials at this time. Pt was left in room with call bell within reach and bed alarm set. ST recommends to continue skilled ST services.      Pain Pain Assessment Pain Scale: 0-10 Pain Score: 0-No pain Pain Type: Acute pain Pain Location: Back Pain Orientation: Upper;Lower Pain Descriptors / Indicators: Aching;Nagging;Sharp Pain Frequency: Constant Pain Onset: On-going Patients Stated Pain Goal: 4 Pain Intervention(s): Medication (See eMAR)  Therapy/Group: Individual Therapy  Saba Neuman  [facility] 12/03/2018, 12:58 PM

## 2018-12-03 NOTE — Progress Notes (Signed)
Occupational Therapy Session Note  Patient Details  Name: Christopher [name] MRN: [id number] Date of Birth: 1966-02-11  Today's Date: 12/03/2018 OT Individual Time: 4270-6237 OT Individual Time Calculation (min): 60 min    Short Term Goals: Week 2:  OT Short Term Goal 1 (Week 2): Pt will transfer with max-total A +1 in order to reduce caregiver burden OT Short Term Goal 2 (Week 2): Pt will independently direct care with bed mobility techniques during ADL task. OT Short Term Goal 3 (Week 2): Pt will tolerate sitting EOB/EOM with supervision for 5 minutes in prep for functional tasks/transfer  Skilled Therapeutic Interventions/Progress Updates:    Pt seen for OT session focusing on functional transfers and OOB tolerance during ADL tasks. Pt in supine upon arrival, agreeable to tx session. Pain rising to 9/10 with mobility at start of session, RN aware and administered pain medication during session. Shoes donned total A in supine. Noted pt's toe nails are exceptionally long making it difficult and uncomfortable for him to have shoes on. RN made aware, to check with nursing direct regarding policy. Toe taped at this time for comfort and safety. Completed x3 sets of 5 AAROM L knee flexion to promote ROM, rest breaks required throughout. He transferred to sitting EOB with max A +1 using hospital bed functions. Pt requiring B UE support and close supervision for sitting balance EOB. Completed x2 sit>stands from EOB 3 muskateer style with +2 assist from elevated EOB. Pt tolerating ~10 seconds in standing each trial before requiring seated rest break. Completed +2 sliding board transfer to w/c with emphasis on UE/LE powering to lift p and over on board vs sliding for skin protection. Pt required extended rest break following transfer before being ready to participate in next activity.  From w/c level at sink, completed oral care and brushed hair. Required assist for set-up of oral care activity. He was  able to lean forward to sink in order to rinse mouth demonstrating improved hip flexion/anterior weight shift.  Pt requesting to  Return to supine at end of session. Completed stand pivot with +2 assist to EOB. Max A to return to supine. Pt positioned in side-lying for comfort, all needs in reach.   Therapy Documentation Precautions:  Precautions Precautions: Fall, Other (comment) Precaution Comments: PEG, ostomy, SP catheter, multiple healing skin grafts/wounds periarea and B LEs Restrictions Weight Bearing Restrictions: No RLE Weight Bearing: Weight bearing as tolerated LLE Weight Bearing: Weight bearing as tolerated   Therapy/Group: Individual Therapy  Avis Tirone L 12/03/2018, 7:02 AM

## 2018-12-03 NOTE — Telephone Encounter (Signed)
Hospital visit to pt in [facility]/rehab- room 19 Pt had just returned from PT via w-chair & had also been to Xray - he is complaining of pain in his lower back He was positioned on his right side I assessed his left hip/sacral area- bandage is intact- but drainage noted  at left iliac area & left sacral area- per his RN: he has an increase in drainage for approx. 2 days She did discuss the increase drainage with Dr./ Naaman Plummer (infectious disease MD) & the in-house wound RN- together they have changed the orders for dressing changes to be done at least every other day- instead of 3x/week Skin graft areas on left lateral & upper thigh are intact & are incorporated I will forward this assessment to Dr. Janey Greaser

## 2018-12-03 NOTE — Progress Notes (Addendum)
[place] PHYSICAL MEDICINE & REHABILITATION PROGRESS NOTE   Subjective/Complaints: Pt in bed. Back/buttock pain still at times but better with current bed. Ongoing drainage from left buttock dressing. Appetite better although no dinner intake recorded. Occasional coughing/rhonchi but better  ROS: Patient denies fever, rash, sore throat, blurred vision, nausea, vomiting, diarrhea, cough, shortness of breath or chest pain, joint or back pain, headache, or mood change.    Objective:   No results found. No results for input(s): WBC, HGB, HCT, PLT in the last 72 hours. No results for input(s): NA, K, CL, CO2, GLUCOSE, BUN, CREATININE, CALCIUM in the last 72 hours.  Intake/Output Summary (Last 24 hours) at 12/03/2018 1028 Last data filed at 12/03/2018 0912 Gross per 24 hour  Intake 980 ml  Output 4475 ml  Net -3495 ml     Physical Exam: Vital Signs Blood pressure 112/76, pulse 100, temperature 97.7 F (36.5 C), temperature source Oral, resp. rate 19, height 6\' 2"  (1.88 m), weight 69.4 kg, SpO2 96 %. Constitutional: No distress . Vital signs reviewed. HEENT: EOMI, oral membranes moist Neck: supple Cardiovascular: RRR without JVD Respiratory:a few scattered rhonchi.  Normal effort    GI: BS +, tender, sl distended. Ostomy and PEG intact without abnl drainage Genitourinary:   SPC intact Musculoskeletal:  Comments: heel cords and left>right knee tight, limited PROM, LB tender. Left knee tight with FLexion and hip limited with ROM as well, pain with attempts at PROM Neurological: He is alert. Oriented. Fair insight and awareness.  UE 3- to 3/5 deltoids, biceps, triceps, wrist/hands 3+/5. RLE 2/5 prox to distal. LLE with trace to 1/5 KE, HF, 0/5 otherwise. Decreased sensation left foot, dorsal and plantar surfaces, proximal leg with altered sensation as well. Less dysesthesias today.  Senses pain and light touch more normally in proximal LLE. RLE without obvious sensory findings.  DTR's traceunchanged Skin: He isnot diaphoretic. Healed grafts bilateral upper thighs. Scarring, granulation.   Low back area/buttock with scattered pockets of graft, granulation/ hypergranulation tissue, and tan colored drainage which to be coming from pockets within granulation tissue. Some serosang discharge too. +odor Psychiatric:  anxiousbut pleasant     Assessment/Plan: 1. Functional deficits secondary to debility, polytrauma  which require 3+ hours per day of interdisciplinary therapy in a comprehensive inpatient rehab setting.  Physiatrist is providing close team supervision and 24 hour management of active medical problems listed below.  Physiatrist and rehab team continue to assess barriers to discharge/monitor patient progress toward functional and medical goals  Care Tool:  Bathing    Body parts bathed by patient: Left arm, Chest, Abdomen   Body parts bathed by helper: Right arm Body parts n/a: Buttocks   Bathing assist Assist Level: Minimal Assistance - Patient > 75%     Upper Body Dressing/Undressing Upper body dressing   What is the patient wearing?: Pull over shirt    Upper body assist Assist Level: Minimal Assistance - Patient > 75%    Lower Body Dressing/Undressing Lower body dressing    Lower body dressing activity did not occur: Safety/medical concerns What is the patient wearing?: Pants     Lower body assist Assist for lower body dressing: 2 Helpers     Toileting Toileting Toileting Activity did not occur (Clothing management and hygiene only): Safety/medical concerns  Toileting assist       Transfers Chair/bed transfer  Transfers assist  Chair/bed transfer activity did not occur: Safety/medical concerns  Chair/bed transfer assist level: 2 Helpers     Locomotion  Ambulation   Ambulation assist   Ambulation activity did not occur: Safety/medical concerns          Walk 10 feet activity   Assist           Walk 50  feet activity   Assist Walk 50 feet with 2 turns activity did not occur: Safety/medical concerns         Walk 150 feet activity   Assist Walk 150 feet activity did not occur: Safety/medical concerns         Walk 10 feet on uneven surface  activity   Assist Walk 10 feet on uneven surfaces activity did not occur: Safety/medical concerns         Wheelchair     Assist Will patient use wheelchair at discharge?: Yes Type of Wheelchair: Manual Wheelchair activity did not occur: Safety/medical concerns         Wheelchair 50 feet with 2 turns activity    Assist            Wheelchair 150 feet activity     Assist Wheelchair 150 feet activity did not occur: Safety/medical concerns        Medical Problem List and Plan: 1.Functional and mobility deficitssecondary to major multiple trauma including numerous soft tissue injuries, pelvic fractures, and left sciatic nerve injury -Continue CIR therapies including PT, OT, and SLP      -can increase therapy to 3 hr/ 5 days per week 2. Antithrombotics: -DVT/anticoagulation:Pharmaceutical:Lovenox -antiplatelet therapy: N/A 3. Pain Management:Fentanyl 50 mcg/72 hrs with dilaudid prn for breakthrough pain.  -pain a little better overall  -needs repositioning prn for comfort  -limited ROM, pain left knee and hip---check xrays to r/o heterotopic ossification 4. Mood:LCSW to follow for evaluation and support. Team to provide ego support. -antipsychotic agents: N/A  5. Neuropsych: This patientiscapable of making decisions on hisown behalf. 6.Multipledegloving injuries with necrosis/Skin/Wound Care:continue local care to ostomy and tube site. Dressings in place for back/buttock areas.  -WOC RN asked to follow up re: complex area of wounds/grafts along back/buttock with increased brownish drainage.  May need to have plastic re-visit    -PRAFO's for bilateral heels Dressing changes to low back buttock area 3 times per week 7. Fluids/Electrolytes/Nutrition: D2 thins +nocturnal TF 50 cc/hr  -continue megace for appetite  -continue H2o flushes  -appetite has picked up but still not completely consistent 8. Acute on chronic respiratory failure: Decannulated--stable on 3L oxygen per [place]. -stoma closed  -continue scheduled mucinex dm to help with cough and clearance of secretions---has helped  -  flutter valve to help mobilize secretions and IS to increase resp volume  9.Recent SIRSdue to enterococcus WPY:[id number] 6/7--6/14 and macrodantin thorough 6/13 ---both complete 10.Urethral disruption/ SPCdependent: Monthly change and flush daily and prn to prevent blockage.Marland Kitchengood amount of sediment in catheter today 11. Anemia of chronic disease:  -iron panel c/w with ACD  -improve nutritional status  -recheck this week 12. Hypernatremia:  water flushes continue  -improving 143 6/13 13. Tachycardia: Continue to monitor HR tid. Lopressor increased to 50 mg tid on 6/9 14.Encephalopathy: On Amantadine and provigil for activation.   -off provigil  -reduce amantadine 15. Anxiety disorder:Currently on multiple medications--Zoloft 25 mg with Seroquel and Klonopin 0.5 daily in addition to prn Klonopin.  -taper seroquel further to 100mg  BID   -further reduction as tolerated 16.  Gastroparesis: reglan 17. Hypokalemia:    -continue kdur 40 BID  -follow up labs pending      LOS: 10 days A  FACE TO FACE EVALUATION WAS PERFORMED  Ranelle OysterZachary T Nare Gaspari 12/03/2018, 10:28 AM

## 2018-12-03 NOTE — Consult Note (Signed)
[facility] Nurse wound consult note Patient receiving care in [facility] [phone].  Assisted by his primary RN, Margaretha Sheffield. Reason for Consult: increased drainage from buttock wounds Wound type: surgical The wound on his buttocks is approximately 95% covered in epithelial tissue, and what is open is healthy, pink, and re-epithelializing.  I have changed the prior wound care order to a daily dressing change. His LLQ fecal stoma is pink, budded, productive, and without peristomal skin impairment.  I assisted his primary RN in changing the pouch.  I have entered an order to let staff on each shift know what type of ostomy supplies he can use.  I have asked the secretary to order additional supplies of these.  Monitor the wound area(s) for worsening of condition such as: Signs/symptoms of infection,  Increase in size,  Development of or worsening of odor, Development of pain, or increased pain at the affected locations.  Notify the medical team if any of these develop.  [name], RN, MSN, CWOCN, CNS-BC, pager [phone]

## 2018-12-03 NOTE — Progress Notes (Signed)
Physical Therapy Session Note  Patient Details  Name: Christopher [name] MRN: [id number] Date of Birth: [birth year]  Today's Date: 12/03/2018 PT Individual Time: 1300-1425 PT Individual Time Calculation (min): 85 min   Short Term Goals: Week 2:  PT Short Term Goal 1 (Week 2): Pt will complete least restrictive transfer with max A x 1 PT Short Term Goal 2 (Week 2): Pt will initiate w/c mobility PT Short Term Goal 3 (Week 2): Pt will tolerate sitting OOB in w/c x 1 hour  Skilled Therapeutic Interventions/Progress Updates:    Pt received seated in bed eating lunch with assist from NT. Set up power w/c adjusting B leg rests for improved pt comfort and fit 2/2 LE contractures. Supine to sit with max A with HOB elevated. Sitting balance EOB with close SBA with BUE and RLE support. Slide board transfer to the R max A x 2. Pt is dependent to adjust hips once seated in power w/c. Adjusted headrest and readjusted leg rests once pt seated in chair for improved fit and comfort. Demonstrated how to perform power w/c mobility then let pt trial power w/c mobility. Pt is able to propel power w/c 2 x 100 ft with Supervision cues for steering. Also reviewed pressure relief techniques in power chair via power tilt and recline feature. Pt agreeable to stay seated in power w/c at end of session with goal of staying up x 30 min, pt to perform pressure relief every 15 min while seated in chair. Instructed pt to call for assist once he needed to lay back down, soft touch call button in reach. Pt reporting pain in low back from being on xray table, not rated and declines any intervention at end of session.  Therapy Documentation Precautions:  Precautions Precautions: Fall, Other (comment) Precaution Comments: PEG, ostomy, SP catheter, multiple healing skin grafts/wounds periarea and B LEs Restrictions Weight Bearing Restrictions: No RLE Weight Bearing: Weight bearing as tolerated LLE Weight Bearing: Weight bearing as  tolerated    Therapy/Group: Individual Therapy   [name], PT, DPT  12/03/2018, 2:28 PM

## 2018-12-03 NOTE — Progress Notes (Signed)
Occupational Therapy Session Note  Patient Details  Name: Christopher [name] MRN: [id number] Date of Birth: 1965/10/18  Today's Date: 12/03/2018 OT Individual Time: 3790-2409 OT Individual Time Calculation (min): 13 min    Short Term Goals: Week 2:  OT Short Term Goal 1 (Week 2): Pt will transfer with max-total A +1 in order to reduce caregiver burden OT Short Term Goal 2 (Week 2): Pt will independently direct care with bed mobility techniques during ADL task. OT Short Term Goal 3 (Week 2): Pt will tolerate sitting EOB/EOM with supervision for 5 minutes in prep for functional tasks/transfer  Skilled Therapeutic Interventions/Progress Updates:    Pt seen for OT session focusing on functional transfers. Pt grimacing/whining in pain upon arrival while sitting up in power w/c. Pt desiring to get back into w/c.  Per pt request, attempted to stand to transfer back to w/c. Ultimately deemed unsafe transfer method. Opted to use slide board with +2 assist to stabilize equipment and transfer to supine. Pt positioned in side-lying position for comfort. RN present administering medications at end of session. All needs in reach.   Pt tolerating ~30 minutes in power w/c, pain 10/10 prior to therapist's arrival.   Therapy Documentation Precautions:  Precautions Precautions: Fall, Other (comment) Precaution Comments: PEG, ostomy, SP catheter, multiple healing skin grafts/wounds periarea and B LEs Restrictions Weight Bearing Restrictions: No RLE Weight Bearing: Weight bearing as tolerated LLE Weight Bearing: Weight bearing as tolerated   Therapy/Group: Individual Therapy  Natsumi Whitsitt L 12/03/2018, 3:08 PM

## 2018-12-04 ENCOUNTER — Inpatient Hospital Stay (HOSPITAL_COMMUNITY): Payer: Self-pay | Admitting: Physical Therapy

## 2018-12-04 ENCOUNTER — Inpatient Hospital Stay (HOSPITAL_COMMUNITY): Payer: Self-pay

## 2018-12-04 ENCOUNTER — Inpatient Hospital Stay (HOSPITAL_COMMUNITY): Payer: Self-pay | Admitting: Occupational Therapy

## 2018-12-04 LAB — GLUCOSE, CAPILLARY
Glucose-Capillary: 119 mg/dL — ABNORMAL HIGH (ref 70–99)
Glucose-Capillary: 90 mg/dL (ref 70–99)
Glucose-Capillary: 91 mg/dL (ref 70–99)

## 2018-12-04 LAB — BASIC METABOLIC PANEL
Anion gap: 9 (ref 5–15)
BUN: 24 mg/dL — ABNORMAL HIGH (ref 6–20)
CO2: 24 mmol/L (ref 22–32)
Calcium: 10.7 mg/dL — ABNORMAL HIGH (ref 8.9–10.3)
Chloride: 103 mmol/L (ref 98–111)
Creatinine, Ser: 0.41 mg/dL — ABNORMAL LOW (ref 0.61–1.24)
GFR calc Af Amer: >60 mL/min (ref >60)
GFR calc non Af Amer: >60 mL/min (ref >60)
Glucose, Bld: 99 mg/dL (ref 70–99)
Potassium: 4.6 mmol/L (ref 3.5–5.1)
Sodium: 136 mmol/L (ref 135–145)

## 2018-12-04 LAB — PREALBUMIN: Prealbumin: 22.8 mg/dL (ref 18–38)

## 2018-12-04 MED ORDER — POTASSIUM CHLORIDE 20 MEQ PO PACK
20.0000 meq | PACK | Freq: Two times a day (BID) | ORAL | Status: DC
  Administered 2018-12-04 – 2018-12-06 (×4): 20 meq
  Filled 2018-12-04 (×4): qty 1

## 2018-12-04 NOTE — Progress Notes (Signed)
[facility] PHYSICAL MEDICINE & REHABILITATION PROGRESS NOTE   Subjective/Complaints: Had a reasonable night. Was a little sore after transport back and forth to xray. Appetite picking up at 80-100% of all meals yesterday.   ROS: Patient denies fever, rash, sore throat, blurred vision, nausea, vomiting, diarrhea, cough, shortness of breath or chest pain,  headache, or mood change.   Objective:   Dg Knee 1-2 Views Left  Result Date: 12/03/2018 CLINICAL DATA:  Chronic left knee pain without known injury. EXAM: LEFT KNEE - 1-2 VIEW COMPARISON:  None. FINDINGS: No evidence of acute fracture, dislocation, or joint effusion. No evidence of arthropathy. Old proximal left fibular shaft fracture is noted. Soft tissues are unremarkable. IMPRESSION: No acute abnormality seen in the left knee. Electronically Signed   By: Marijo Conception M.D.   On: 12/03/2018 13:06   Dg Hip Unilat W Or W/o Pelvis 1v Left  Result Date: 12/03/2018 CLINICAL DATA:  Chronic left hip pain without recent injury. EXAM: DG HIP (WITH OR WITHOUT PELVIS) 1V*L* COMPARISON:  None. FINDINGS: Status post surgical internal fixation of proximal left femoral neck fracture. Persistent fracture line remains, although callus formation is noted. Also noted is surgical fixation of left sacroiliac joint. Surgical fixation of the left acetabulum and superior pubic ramus is noted. There remains moderately displaced fractures involving the left superior and inferior pubic rami. IMPRESSION: Postsurgical and posttraumatic changes as described above. Electronically Signed   By: Marijo Conception M.D.   On: 12/03/2018 13:04   No results for input(s): WBC, HGB, HCT, PLT in the last 72 hours. Recent Labs    12/03/18 0957  NA 136  K 4.6  CL 104  CO2 23  GLUCOSE 96  BUN 26*  CREATININE 0.42*  CALCIUM 10.7*    Intake/Output Summary (Last 24 hours) at 12/04/2018 0903 Last data filed at 12/04/2018 0840 Gross per 24 hour  Intake 1415 ml  Output 7120 ml   Net -5705 ml     Physical Exam: Vital Signs Blood pressure 124/82, pulse 96, temperature 97.7 F (36.5 C), temperature source Oral, resp. rate (!) 22, height 6\' 2"  (1.88 m), weight 69.4 kg, SpO2 98 %. Constitutional: No distress . Vital signs reviewed. HEENT: EOMI, oral membranes moist Neck: supple Cardiovascular: RRR without murmur. No JVD    Respiratory: CTA Bilaterally without wheezes or rales. Normal effort    GI: BS +,  tender, non-distended  Genitourinary:   SPC intact Musculoskeletal:  Comments: heel cords and left>right knee tight, limited PROM, LB tender. Left knee extension contracture. Left hip limited with PROM as well.  Neurological: He is alert. Oriented. Fair insight and awareness.  UE 3- to 3/5 deltoids, biceps, triceps, wrist/hands 3+/5. RLE 2/5 prox to distal. LLE with trace to 1/5 KE, HF, 0/5 otherwise. Decreased sensation left foot, dorsal and plantar surfaces, proximal leg with altered sensation as well. Less dysesthesias today.  Senses pain and light touch more normally in proximal LLE. RLE without obvious sensory findings. Motor and sensory stable 6/23 Skin: He isnot diaphoretic. Healed grafts bilateral upper thighs. Scarring, granulation.   Low back area/buttock with scattered pockets of graft, granulation/ hypergranulation tissue with drainage, dressing in place.  Psychiatric:  pleasant     Assessment/Plan: 1. Functional deficits secondary to debility, polytrauma  which require 3+ hours per day of interdisciplinary therapy in a comprehensive inpatient rehab setting.  Physiatrist is providing close team supervision and 24 hour management of active medical problems listed below.  Physiatrist and rehab  team continue to assess barriers to discharge/monitor patient progress toward functional and medical goals  Care Tool:  Bathing    Body parts bathed by patient: Left arm, Chest, Abdomen   Body parts bathed by helper: Right arm Body parts n/a:  Buttocks   Bathing assist Assist Level: Minimal Assistance - Patient > 75%     Upper Body Dressing/Undressing Upper body dressing   What is the patient wearing?: Pull over shirt    Upper body assist Assist Level: Minimal Assistance - Patient > 75%    Lower Body Dressing/Undressing Lower body dressing    Lower body dressing activity did not occur: Safety/medical concerns What is the patient wearing?: Pants     Lower body assist Assist for lower body dressing: 2 Helpers     Toileting Toileting Toileting Activity did not occur Press photographer(Clothing management and hygiene only): Safety/medical concerns  Toileting assist       Transfers Chair/bed transfer  Transfers assist  Chair/bed transfer activity did not occur: Safety/medical concerns  Chair/bed transfer assist level: 2 Helpers     Locomotion Ambulation   Ambulation assist   Ambulation activity did not occur: Safety/medical concerns          Walk 10 feet activity   Assist           Walk 50 feet activity   Assist Walk 50 feet with 2 turns activity did not occur: Safety/medical concerns         Walk 150 feet activity   Assist Walk 150 feet activity did not occur: Safety/medical concerns         Walk 10 feet on uneven surface  activity   Assist Walk 10 feet on uneven surfaces activity did not occur: Safety/medical concerns         Wheelchair     Assist Will patient use wheelchair at discharge?: Yes Type of Wheelchair: Power Wheelchair activity did not occur: Safety/medical concerns  Wheelchair assist level: Supervision/Verbal cueing Max wheelchair distance: 100'    Wheelchair 50 feet with 2 turns activity    Assist        Assist Level: Supervision/Verbal cueing   Wheelchair 150 feet activity     Assist Wheelchair 150 feet activity did not occur: Safety/medical concerns        Medical Problem List and Plan: 1.Functional and mobility deficitssecondary to major  multiple trauma including numerous soft tissue injuries, pelvic fractures, and left sciatic nerve injury -Continue CIR therapies including PT, OT, and SLP    -team conference today  -  therapy  3 hr/ 5 days per week 2. Antithrombotics: -DVT/anticoagulation:Pharmaceutical:Lovenox -antiplatelet therapy: N/A 3. Pain Management:Fentanyl 50 mcg/72 hrs with dilaudid prn for breakthrough pain.  -pain a little better overall  -needs repositioning prn for comfort  -limited ROM, pain left knee and hip---xrays of left hip and knee reviewed. his extensive fractures appear stable, hardware in place and generally everything in alignment. No signs of HO in knee or hip.  4. Mood:LCSW to follow for evaluation and support. Team to provide ego support. -antipsychotic agents: N/A  5. Neuropsych: This patientiscapable of making decisions on hisown behalf. 6.Multipledegloving injuries with necrosis/Skin/Wound Care:continue local care to ostomy and tube site. Dressings in place for back/buttock areas.  -WOC RN follow up appreciated. Dressing changes increased -PRAFO's for bilateral heels Dressing changes to low back buttock area 3 times per week 7. Fluids/Electrolytes/Nutrition: D2 thins +nocturnal TF 50 cc/hr  -continue megace for appetite  -increased H2o flushes based on labs,  continue with flushes for now  -appetite improved   -will dc TF 8. Acute on chronic respiratory failure: Decannulated--stable on 3L oxygen per [place]. -stoma closed  -continue scheduled mucinex dm to help with cough and clearance of secretions---has helped  - continue flutter valve, IS 9.Recent SIRSdue to enterococcus VOZ:[id number]:Cefepime 6/7--6/14 and macrodantin thorough 6/13 ---both complete 10.Urethral disruption/ SPCdependent: Monthly change and flush daily and prn to prevent blockage. 11. Anemia of chronic disease:  -iron panel c/w with  ACD  -improve nutritional status  -recheck this week 12. Hypernatremia:  water flushes continue  -improving 135 6/22 13. Tachycardia: Continue to monitor HR tid. Lopressor increased to 50 mg tid on 6/9 14.Encephalopathy:    -off provigil and amantadine 15. Anxiety disorder:Currently on multiple medications--Zoloft 25 mg with Seroquel and Klonopin 0.5 daily in addition to prn Klonopin.  -tapered seroquel further to 100mg  BID 6/22  -further reduction as tolerated  -consider increase in zoloft as we taper other meds.  16.  Gastroparesis: reglan 17. Hypokalemia:    - kdur 40 BID  -follow up potassium 4.6, reduce kdur to 20mq bid      LOS: 11 days A FACE TO FACE EVALUATION WAS PERFORMED  Ranelle OysterZachary T Breeona Waid 12/04/2018, 9:03 AM

## 2018-12-04 NOTE — Progress Notes (Signed)
Physical Therapy Session Note  Patient Details  Name: Christopher [name] MRN: [id number] Date of Birth: 10-29-1965  Today's Date: 12/04/2018 PT Individual Time: 1015-1045; 1430-1530 PT Individual Time Calculation (min): 30 min and 60 min  Short Term Goals: Week 2:  PT Short Term Goal 1 (Week 2): Pt will complete least restrictive transfer with max A x 1 PT Short Term Goal 2 (Week 2): Pt will initiate w/c mobility PT Short Term Goal 3 (Week 2): Pt will tolerate sitting OOB in w/c x 1 hour  Skilled Therapeutic Interventions/Progress Updates:    Session 1: Pt received seated in power w/c in room, agreeable to PT session. Pt reports 8/10 pain in buttocks and unable to get comfortable even when chair tilted and reclined. Pt reports he has been sitting up x 30 min and requests to return to bed. Manual hoyer transfer w/c to bed with assist x 2. Rolling L/R with min A for removal of hoyer sling. Pt is max to total A to don pants with rolling in supine. Pt left sidelying on R side in bed with needs in reach at end of session.  Session 2: Pt received seated in bed, agreeable to PT session. Pt reports pain in lower back/buttocks, premedicated by RN prior to session. Pt agreeable to get up to w/c. Rolling L/R with min A and use of bedrails for manual hoyer sling placement. Manual hoyer transfer bed to power w/c. Power w/c mobility 2 x 150 ft with Supervision assist. Session focus on navigating power w/c through cone obstacle course spaced 3 ft apart with Supervision, 2 ft apart with increase in verbal cueing needed to safely navigate obstacles. Pt requesting to return to bed at end of session. Manual hoyer transfer w/c to bed. Pt left sidelying on R side in bed with needs in reach at end of session.  Therapy Documentation Precautions:  Precautions Precautions: Fall, Other (comment) Precaution Comments: PEG, ostomy, SP catheter, multiple healing skin grafts/wounds periarea and B LEs Restrictions Weight  Bearing Restrictions: No RLE Weight Bearing: Weight bearing as tolerated LLE Weight Bearing: Weight bearing as tolerated    Therapy/Group: Individual Therapy   [name], PT, DPT  12/04/2018, 12:04 PM

## 2018-12-04 NOTE — Progress Notes (Signed)
Occupational Therapy Session Note  Patient Details  Name: Christopher [name] MRN: [id number] Date of Birth: [birth year]  Today's Date: 12/04/2018 OT Individual Time: 2683-4196 OT Individual Time Calculation (min): 75 min    Short Term Goals: Week 2:  OT Short Term Goal 1 (Week 2): Pt will transfer with max-total A +1 in order to reduce caregiver burden OT Short Term Goal 2 (Week 2): Pt will independently direct care with bed mobility techniques during ADL task. OT Short Term Goal 3 (Week 2): Pt will tolerate sitting EOB/EOM with supervision for 5 minutes in prep for functional tasks/transfer  Skilled Therapeutic Interventions/Progress Updates:    Pt seen for OT session focusing on functional mobility, positioning and OOB tolerance. Pt in supine upon arrival with nurse exiting. Reports pt pre-medicated prior to tx session and agreeable to tx session.  Introduced [employer] account planner as possible option for d/c use. Completed bed mobility with mod A in order for sling to be placed. With +2 assist, transferred via [facility] to power w/c. Pt tolerated well with changed of positioning in hips and LEs during transfer.  Time spent with OT and PT clinical specialist making adaptations to pt's chair to facilitate neutral positioning as well as increasing comfort for pt to tolerate extended time OOB.  Wedge, towel roll and ACE wraps used to modify w/c until custom chair options can be determined/ we have access to it. Pt reports improved comfort in chair compared to prior to modifications.  He drove power w/c throughout unit and manipulated in small spots in room with supervision and VCs for technique.  Reviewed pressure relief schedule and technique for power w/c.  Pt desiring to stay up in chair at end of session, goal of sitting up until PT session 30 minutes following OT session.  Pt left with all needs in reach seated in power w/c.  Education provided throughout session regarding importance of proper  positioning, pain management, OOB tolerance, home modifications, need for assist at d/c, continuum of care, home accessibility and d/c planning.   Therapy Documentation Precautions:  Precautions Precautions: Fall, Other (comment) Precaution Comments: PEG, ostomy, SP catheter, multiple healing skin grafts/wounds periarea and B LEs Restrictions Weight Bearing Restrictions: No RLE Weight Bearing: Weight bearing as tolerated LLE Weight Bearing: Weight bearing as tolerated   Therapy/Group: Individual Therapy  Normand Damron L 12/04/2018, 6:59 AM

## 2018-12-04 NOTE — Progress Notes (Signed)
Speech Language Pathology Daily Session Note  Patient Details  Name: Christopher [name] MRN: [id number] Date of Birth: 06-29-1965  Today's Date: 12/04/2018 SLP Individual Time: 1103-1200 SLP Individual Time Calculation (min): 57 min  Short Term Goals: Week 2: SLP Short Term Goal 1 (Week 2): Pt will consume trials of dysphagia 3 with minimal overt s/s of aspiration and decreased effort to complete oral phase over 3 sessions prior to diet upgrade. SLP Short Term Goal 2 (Week 2): Pt will demonstrate selective attention to basic task for ~ 30 minutes with Min A cues. SLP Short Term Goal 3 (Week 2): Pt will utilize external memory aids to recall date and salient events with supervision cues. SLP Short Term Goal 4 (Week 2): Pt will complete basic functional problem solving tasks with Min A cues. SLP Short Term Goal 5 (Week 2): Pt will describe object during barrier task in mildly noisy environment to achieve > 95% intelligibility.  Skilled Therapeutic Interventions: Skilled ST services focused on swallow and cognitive skills. Pt consumed dys 3 trial snack, demonstrating slight prolonged mastication, however swallow appeared functional. SLP facilitated basic problem solving skills in functional money management task, pt demonstrated ability to display requested amount of change in 2 out 4 opportunities and total A to make basic change. Pt supports baseline deficits with making simple change. SLP also facilitated basic problem solving skills in novel card task played at simplest level, pt required mod A fade to supervision A verbal cues (2/3rd way though task.) Pt required supervision A verbal cues for recall of three rules with external aid. Pt demonstrated 95% intelligibility in communication barrier task describing objects on picture cards and in room. Pt was left in room with call bell within reach and bed alarm set. ST recommends to continue skilled ST services.      Pain Pain Assessment Pain Scale:  0-10 Pain Score: 0-No pain Pain Type: Acute pain Pain Location: Back Pain Orientation: Upper;Lower Pain Descriptors / Indicators: Aching Pain Frequency: Constant Pain Onset: On-going Patients Stated Pain Goal: 3 Pain Intervention(s): Medication (See eMAR)  Therapy/Group: Individual Therapy  Yair Dusza  [facility] 12/04/2018, 12:11 PM

## 2018-12-04 NOTE — Patient Care Conference (Signed)
Inpatient RehabilitationTeam Conference and Plan of Care Update Date: 12/04/2018   Time: 2:00 PM    Patient Name: Christopher [name]      Medical Record Number: [id number]  Date of Birth: [birth year] Sex: Male         Room/Bed: 4W19C/4W19C-01 Payor Info: Payor: [employer] / Plan: GALLAGHER / Product Type: *No Product type* /    Admitting Diagnosis: 1. SCI Team other multi trauma; 19-23 days  Admit Date/Time:  11/23/2018  1:18 PM Admission Comments: No comment available   Primary Diagnosis:  <principal problem not specified> Principal Problem: <principal problem not specified>  Patient Active Problem List   Diagnosis Date Noted  . Debility 11/23/2018  . Acute on chronic respiratory failure with hypoxia ([place])   . Bacteremia due to Pseudomonas   . Chronic pain syndrome   . Multiple traumatic injuries   . Pressure injury of skin 08/13/2018  . Acute hemorrhage 07/13/2018  . Degloving injury of lower leg, initial encounter 07/13/2018  . Urethral injury 07/13/2018  . Scrotal injury, initial encounter 07/13/2018  . Fracture of femoral neck, left ([place]) 07/10/2018  . Multiple fractures of pelvis with unstable disruption of pelvic ring, initial encounter for open fracture ([place]) 07/10/2018    Expected Discharge Date: Expected Discharge Date: 12/14/18  Team Members Present: Physician leading conference: Dr. Alger Simons Social Worker Present: Alfonse Alpers, LCSW Nurse Present: Starlyn Skeans, LPN PT Present: [name], PT OT Present: Amy Rounds, OT SLP Present: Charolett Bumpers, SLP PPS Coordinator present : Gunnar Fusi, SLP     Current Status/Progress Goal Weekly Team Focus  Medical   ongoing wound care issues. pain. nutritionally improving. 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  Contiune to teach patient and family how  to care for ostomy and superpubic cath.      Swallow/Nutrition/ Hydration   dys 2 and thin  Mod I  trials of dys 3   ADL's   Total A bathing (on night bath); total A LB dressing; max A UB dressing; Hoyer lift, sliding board and stand pivot transfers total A  Moderate assist standing balance, LB dressing and bathing, supervision grooming and UB dressing- may downgrade pending pt progress  Functional activity tolerance, UE/LE ROM/ strengthening, sitting positioning, d/c planning, family education   Mobility   min to mod A rolling, max assist to assist x 2 bed mobility, mod to max A x 2 SB transfers and stand pivot transfers, Supervision power w/c mobility  min A overall  OOB tolerance, transfers, power w/c mobility   Communication   supervision A  Mod I  intelligibility strategies in various environments   Safety/Cognition/ Behavioral Observations  Min A  Supervision A  selective attention, basic problem solving and memory   Pain   Patient is having pain that is controlled by medication when he is willing to take it.  Work with patient  on staying on top of pain keeping him from waiting untill the pain is unbearable.      Skin   Patient has a sacrum wound that is across his bottom this is being treated with alginate and dry dressing daily and prn. Patinet also has a wounded to his scrotum that is being cleanesed twice a day.  The goal is to keep the wounds clean and dry and allow for healing.         *  See Care Plan and progress notes for long and short-term goals.     Barriers to Discharge  Current Status/Progress Possible Resolutions Date Resolved   Physician    Medical stability;Wound Care        see progress notes      Nursing                  PT  Medical stability;Home environment access/layout;Wound Care;Other (comments)  poor OOB tolerance 2/2 pain              OT                  SLP                SW                Discharge Planning/Teaching Needs:  Pt to return  home where pt's fiance and other family members will assist.  They may need the additional assistance of caregivers through Workers Comp.  Rehab Team to continue to evaluate for this.  Family education prior to pt's discharge, as he will minimal to maximal assistance at home.   Team Discussion:  Pt with ongoing wound care issues requiring WOC RN to return.  Dr. Riley KillSwartz has asked for dressing to be changed twice daily due to increased drainage and odor.  Pt's left hip xray still shows healing fxs and hardware inserted.  Tube feeds were stopped and pt is eating well.  He is also weaning pt off medications he no longer needs or decreasing where appropriate.  Pt has wound to genital area and RN applied foam under testicles and he couldn't tolerate cleaning area.  Pt requires "burping" of the colostomy multiple times a day and pt needs toenails clipped.  RN asked if blood sugars could be d/c'd since tube feeding were and MD said yes.  Pt is making slow, but steady progress with OT.  Hoyer lift will be best way to transfer at home.  Pt is eager to get home.  Pt is max A with UB dressing, total for bathing.  Activity tolerance and endurance limit him. OT will likely need to downgrade goals.  Power w/c mobility started and he is sitting up for 30 minutes at a time, all he can tolerate.  Pt need min A rolling and max A to +2 to sit up in bed.  Pt is on D2 diet with thin liquids and trials of D3.  Pt's vocal intensity and intelligibility is better.  RMT went well.  Pt is min A for problem solving, selective attention, and memory.   Revisions to Treatment Plan:  none    Continued Need for Acute Rehabilitation Level of Care: The patient requires daily medical management by a physician with specialized training in physical medicine and rehabilitation for the following conditions: Daily analysis of laboratory values and/or radiology reports with any subsequent need for medication adjustment of medical intervention for :  Post surgical problems;Neurological problems;Wound care problems;Mood/behavior problems   I attest that I was present, lead the team conference, and concur with the assessment and plan of the team.Team conference was held via web/ teleconference due to COVID - 19.   Jearldean Gutt, Vista DeckJennifer Capps 12/04/2018, 2:24 PM

## 2018-12-04 NOTE — Progress Notes (Signed)
Nutrition Follow-up  DOCUMENTATION CODES:   Severe malnutrition in context of chronic illness  INTERVENTION:   - Continue 1 packet Juven BID per tube, each packet provides 95 calories, 2.5 grams of protein, and 9.8 grams of carbohydrate; also contains L-arginine and L-glutamine, vitamin C, vitamin E, vitamin B-12, zinc, calcium, and calcium Beta-hydroxy-Beta-methylbutyrate to support wound healing  - Continue Pro-stat 60 ml TID per tube, each supplement provides 100 kcal and 15 grams of protein (provides a total of 600 kcal and 90 grams of protein)  - Continue liquid MVI daily per tube  - Magic cup TID with meals, each supplement provides 290 kcal and 9 grams of protein  - Snacks TID between meals  - d/c Boost Breeze  NUTRITION DIAGNOSIS:   Severe Malnutrition related to chronic illness (trauma, wounds, multiple fractures, dysphagia) as evidenced by severe muscle depletion, severe fat depletion, percent weight loss (16.3% weight loss in 3 months).  Ongoing, being addressed via snacks and oral nutrition supplements  GOAL:   Patient will meet greater than or equal to 90% of their needs  Progressing  MONITOR:   Labs, I & O's, Weight trends, TF tolerance, Skin, Diet advancement  REASON FOR ASSESSMENT:   Consult Enteral/tube feeding initiation and management  ASSESSMENT:   53 year old male who presented to the ED on 07/10/18 as a level 1 trauma after he was accidentally backed over with a tractor trailer at work. Obvious open wound to left groin and scrotum. Taken to OR for hemorrhage control. Urology consulted for urethral trauma and scrotal degloving. Pt developed abdominal compartment syndrome in OR and underwent decompressive laparotomy. IR performed pelvic arteriogram and embolization of right internal iliac and SP tube placement. Hospital course with ortho consulted recommended operative fixation of pelvis when stabilized and placed in traction 1/29. Plastics consulted 1/29  for complex degloving of left groin/thigh and scrotal degloving. To OR with trauma and orthopedic surgery 1/30 for abdominal washout and pelvic fixation. Pt underwent multiple additional OR procedures during hospitalization. On 2/10 pt had creation of diverting colostomy. On 2/20 pt had tracheostomy. On 3/3 pt had PEG placed. Pt discharged to [facility] on 10/16/18 on vent via trach. Eventually weaned from vent and began capping trials on 5/28 with eventual decannulation on 6/5. Pt failed FEES on 6/1 and remains NPO. Admitted to CIR on 6/12.  6/15 - MBS with recommendations for Regular diet 6/16 - diet downgraded to Dysphagia 2 for energy conservation  MD d/c nocturnal tube feeding orders this AM due to pt's increased PO intake.  Reviewed weights. Pt's weight is down 3 lbs overall since admit but has been fluctuating between 152-159 lbs. Will continue to monitor trends.  Spoke with pt at bedside. Pt happy to have nocturnal TF d/c. RD discussed the increased importance now for pt to eat enough. Pt states he does not like any of the oral nutrition supplements he has tried - chocolate, vanilla, strawberry, fruity, etc. Pt states he has tried them all but can't stand the taste. RD will d/c Boost Breeze.  Pt mentions that he does like [employer], dispensing. RD will order these TID with meals. RD took pt's dinner order: cheeseburger, sweet tea, mashed potatoes, jello. RD will add Magic Cup to this.  Pt understands the importance of eating well at meals and states he is willing to receive snacks between meals to help increase his PO intake further. RD will order snacks TID between meals.  At this time, recommend continuing to provide both Juven and  Pro-stat per tube as ordered.  Meal Completion: 50-100% x last 8 recorded meals (averaging 76%)  Medications reviewed and include: Boost Breeze TID (pt refusing), Pro-stat 60 ml TID per tube, free water 250 ml QID, SSI, Megace 400 mg BID, liquid MVI, Juven BID, Protonix,  Reglan 5 mg QID, Klor-con 20 mEq BID, Miralax  Labs reviewed. CBG's: 90-128 x 24 hours  UOP: 5900 ml x 24 hours Colostomy: 50 ml recorded x 24 hours I/O's: -35.0 L since admit  Diet Order:   Diet Order            DIET DYS 2 Room service appropriate? Yes; Fluid consistency: Thin  Diet effective now              EDUCATION NEEDS:   No education needs have been identified at this time  Skin:  Skin Assessment:  Skin Integrity Issues: Other: chronic wounds ot back and sacrum/buttock area; s/p A cell grafts to scrotum, perineal skin, and buttocks wounds  Last BM:  12/04/18 colostomy  Height:   Ht Readings from Last 1 Encounters:  11/28/18 6\' 2"  (1.88 m)    Weight:   Wt Readings from Last 1 Encounters:  12/04/18 69.4 kg    Ideal Body Weight:  86.4 kg  BMI:  Body mass index is 19.64 kg/m.  Estimated Nutritional Needs:   Kcal:  2700-2900  Protein:  145-170 grams  Fluid:  >2.0 L    Earma ReadingKate Jablonski Uyen Eichholz, MS, RD, LDN Inpatient Clinical Dietitian Pager: [phone] Weekend/After Hours: [phone]

## 2018-12-05 ENCOUNTER — Inpatient Hospital Stay (HOSPITAL_COMMUNITY): Payer: Self-pay | Admitting: Speech Pathology

## 2018-12-05 ENCOUNTER — Inpatient Hospital Stay (HOSPITAL_COMMUNITY): Payer: Self-pay | Admitting: Physical Therapy

## 2018-12-05 ENCOUNTER — Inpatient Hospital Stay (HOSPITAL_COMMUNITY): Payer: Self-pay | Admitting: Occupational Therapy

## 2018-12-05 MED ORDER — QUETIAPINE FUMARATE 50 MG PO TABS
50.0000 mg | ORAL_TABLET | Freq: Two times a day (BID) | ORAL | Status: DC
  Administered 2018-12-05 – 2018-12-06 (×2): 50 mg
  Filled 2018-12-05 (×2): qty 1

## 2018-12-05 MED ORDER — LIDOCAINE-EPINEPHRINE 1 %-1:100000 IJ SOLN
INTRAMUSCULAR | Status: AC
  Filled 2018-12-05: qty 1

## 2018-12-05 NOTE — Plan of Care (Signed)
  Problem: Consults Goal: RH GENERAL PATIENT EDUCATION Description: See Patient Education module for education specifics. Outcome: Progressing   Problem: RH BOWEL ELIMINATION Goal: RH STG MANAGE BOWEL WITH ASSISTANCE Description: STG Manage Bowel with max Assistance. Outcome: Progressing   Problem: RH BLADDER ELIMINATION Goal: RH STG MANAGE BLADDER WITH EQUIPMENT WITH ASSISTANCE Description: STG Manage Bladder With Equipment With  max Assistance Outcome: Progressing   Problem: RH SKIN INTEGRITY Goal: RH STG SKIN FREE OF INFECTION/BREAKDOWN Description: Manage with max assist Outcome: Progressing Goal: RH STG MAINTAIN SKIN INTEGRITY WITH ASSISTANCE Description: STG Maintain Skin Integrity With max Assistance. Outcome: Progressing Goal: RH STG ABLE TO PERFORM INCISION/WOUND CARE W/ASSISTANCE Description: STG Able To Perform Incision/Wound Care With max Assistance. Outcome: Progressing   Problem: RH SAFETY Goal: RH STG ADHERE TO SAFETY PRECAUTIONS W/ASSISTANCE/DEVICE Description: STG Adhere to Safety Precautions With supervision Assistance/Device. Outcome: Progressing   Problem: RH PAIN MANAGEMENT Goal: RH STG PAIN MANAGED AT OR BELOW PT'S PAIN GOAL Description: At or below level 4 Outcome: Progressing   Problem: RH KNOWLEDGE DEFICIT GENERAL Goal: RH STG INCREASE KNOWLEDGE OF SELF CARE AFTER HOSPITALIZATION Description: Family will be able to manage care with verbal direction of patient and using handouts/educational materials independently Outcome: Progressing   Problem: Nutritional: Goal: Ability to achieve adequate nutritional intake will improve Description: Family will be able to administer tube feedings , water flushes and manage PEG tube independently using handouts, educational materials and experience from hands on practice before discharge Outcome: Progressing

## 2018-12-05 NOTE — Progress Notes (Signed)
Physical Therapy Session Note  Patient Details  Name: Christopher [name] MRN: [id number] Date of Birth: 10-22-1965  Today's Date: 12/05/2018 PT Individual Time: 1340-1410 PT Individual Time Calculation (min): 30 min  PT Missed Time: 45 min Missed Time Reason: nursing care; pain  Short Term Goals: Week 2:  PT Short Term Goal 1 (Week 2): Pt will complete least restrictive transfer with max A x 1 PT Short Term Goal 2 (Week 2): Pt will initiate w/c mobility PT Short Term Goal 3 (Week 2): Pt will tolerate sitting OOB in w/c x 1 hour  Skilled Therapeutic Interventions/Progress Updates:    Pt received supine in bed having ostomy bag changed by RN since it came off prior to session. Pt missed 10 min at beginning of session for RN care. Once pt has new ostomy bag he is agreeable to therapy session, but also reports 9/10 pain in L hip. RN able to provide pain medication. Also tried hot pack to left hip for pain relief but pt unable to feel hot pack. Pt requesting to lay on R side due to L hip pain, declines any OOB mobility. Sidelying PROM to left knee and hip to tolerance, pt begins crying due to pain, deferred further ROM. Pt left sidelying on R side with needs in reach. Pt missed 35 min at end of session due to significant pain, RN aware.  Therapy Documentation Precautions:  Precautions Precautions: Fall, Other (comment) Precaution Comments: PEG, ostomy, SP catheter, multiple healing skin grafts/wounds periarea and B LEs Restrictions Weight Bearing Restrictions: No RLE Weight Bearing: Weight bearing as tolerated LLE Weight Bearing: Weight bearing as tolerated    Therapy/Group: Individual Therapy   [name], PT, DPT  12/05/2018, 2:59 PM

## 2018-12-05 NOTE — Progress Notes (Signed)
Occupational Therapy Session Note  Patient Details  Name: Christopher [name] MRN: [id number] Date of Birth: 03/27/1966  Today's Date: 12/05/2018 OT Individual Time: 5465-0354 OT Individual Time Calculation (min): 75 min    Short Term Goals: Week 2:  OT Short Term Goal 1 (Week 2): Pt will transfer with max-total A +1 in order to reduce caregiver burden OT Short Term Goal 2 (Week 2): Pt will independently direct care with bed mobility techniques during ADL task. OT Short Term Goal 3 (Week 2): Pt will tolerate sitting EOB/EOM with supervision for 5 minutes in prep for functional tasks/transfer  Skilled Therapeutic Interventions/Progress Updates:    Pt seen for OT session focusing on functional mobility, OOB tolerance and self-feeding goal. Pt in supine upon arrival with Rn present administering AM meds. Pt denying pain at rest, however, severe pain with all mobility and changes in positioning. RN adminstered pain meds during session and pt repositioned for comfort throughout as well as rest breaks provided.  Shoes donned total A in supine.  He transferred to sitting EOB with max A +1. Pt able to tolerate sitting EOB ~3 minutes while RN administered meds via PEG. Pt with increase in lower back pain with unsupported sitting, mod A to keep balance EOB due to posterior bias. PT required return to supine following 3 minutes for rest break in sidelying due to lower back and sacral pain.  Light massage and ROM performed to L knee while pt in side-lying. He then transferred to sitting EOB in same manner as described above. Completed 3 musketeer style stand pivot transfer to power chair. Pt becoming orthostatic with positional changes, would benefit from thigh high TEDs for future sessions. He managed power w/c throughout unit with supervision. Self-fed breakfast seated at high/low table with assist for set-up. Pt fatiguing quickly, likely due to pain. Repositioned using power chair functions.  Pt  requesting to return to supine at end of session. Ambulated 3-muskateer style back to bed, mod-max A to advance L LE. Pt with increase in pain in L hip following mobility. He returned to side-lying at end of session, all needs in reach.   Therapy Documentation Precautions:  Precautions Precautions: Fall, Other (comment) Precaution Comments: PEG, ostomy, SP catheter, multiple healing skin grafts/wounds periarea and B LEs Restrictions Weight Bearing Restrictions: No RLE Weight Bearing: Weight bearing as tolerated LLE Weight Bearing: Weight bearing as tolerated   Therapy/Group: Individual Therapy  Dorthula Bier L 12/05/2018, 6:44 AM

## 2018-12-05 NOTE — Progress Notes (Signed)
Speech Language Pathology Daily Session Note  Patient Details  Name: Christopher [name] MRN: [id number] Date of Birth: 1966-04-13  Today's Date: 12/05/2018 SLP Individual Time: 0930-1030 SLP Individual Time Calculation (min): 60 min  Short Term Goals: Week 2: SLP Short Term Goal 1 (Week 2): Pt will consume trials of dysphagia 3 with minimal overt s/s of aspiration and decreased effort to complete oral phase over 3 sessions prior to diet upgrade. SLP Short Term Goal 2 (Week 2): Pt will demonstrate selective attention to basic task for ~ 30 minutes with Min A cues. SLP Short Term Goal 3 (Week 2): Pt will utilize external memory aids to recall date and salient events with supervision cues. SLP Short Term Goal 4 (Week 2): Pt will complete basic functional problem solving tasks with Min A cues. SLP Short Term Goal 5 (Week 2): Pt will describe object during barrier task in mildly noisy environment to achieve > 95% intelligibility.  Skilled Therapeutic Interventions:  Skilled treatment session focused on dysphagia, communication and cognition goals. SLP facilitated session by providing skilled observation of pt consuming dysphagia 3 items. Pt with improved endurance, effective mastication and functional ability. Will upgrade for lunch tray. Therapy also focused on communication with pt demonstrating improved vocal intensity and precise articulation to communicate in moderately noisy environment with > 95% intelligibility. Pt left in bed with all needs within reach. Continue per current plan of care.      Pain Pain Assessment Pain Scale: 0-10 Pain Score: 8  Pain Type: Acute pain Pain Location: Back Pain Orientation: Mid;Lower Pain Descriptors / Indicators: Aching Pain Frequency: Constant Pain Onset: On-going Patients Stated Pain Goal: 6 Pain Intervention(s): Medication (See eMAR);Repositioned  Therapy/Group: Individual Therapy  Abb Gobert 12/05/2018, 3:14 PM

## 2018-12-05 NOTE — Plan of Care (Signed)
Sit>stand and standing balance goals d/c as not appropriate at this time. LB dressing goal also d/c as pt requires total A from bed level for LB dressing. See POC for goal details. Carman Essick, OTR/L

## 2018-12-05 NOTE — Progress Notes (Signed)
Christopher [name] PHYSICAL MEDICINE & REHABILITATION PROGRESS NOTE   Subjective/Complaints: Had a reasonable night. Was a little sore after transport back and forth to xray. Appetite picking up at 80-100% of all meals yesterday.   ROS: Patient denies fever, rash, sore throat, blurred vision, nausea, vomiting, diarrhea, cough, shortness of breath or chest pain,  headache, or mood change.   Objective:   Dg Knee 1-2 Views Left  Result Date: 12/03/2018 CLINICAL DATA:  Chronic left knee pain without known injury. EXAM: LEFT KNEE - 1-2 VIEW COMPARISON:  None. FINDINGS: No evidence of acute fracture, dislocation, or joint effusion. No evidence of arthropathy. Old proximal left fibular shaft fracture is noted. Soft tissues are unremarkable. IMPRESSION: No acute abnormality seen in the left knee. Electronically Signed   By: Marijo Conception M.D.   On: 12/03/2018 13:06   Dg Hip Unilat W Or W/o Pelvis 1v Left  Result Date: 12/03/2018 CLINICAL DATA:  Chronic left hip pain without recent injury. EXAM: DG HIP (WITH OR WITHOUT PELVIS) 1V*L* COMPARISON:  None. FINDINGS: Status post surgical internal fixation of proximal left femoral neck fracture. Persistent fracture line remains, although callus formation is noted. Also noted is surgical fixation of left sacroiliac joint. Surgical fixation of the left acetabulum and superior pubic ramus is noted. There remains moderately displaced fractures involving the left superior and inferior pubic rami. IMPRESSION: Postsurgical and posttraumatic changes as described above. Electronically Signed   By: Marijo Conception M.D.   On: 12/03/2018 13:04   No results for input(s): WBC, HGB, HCT, PLT in the last 72 hours. Recent Labs    12/03/18 0957 12/04/18 0949  NA 136 136  K 4.6 4.6  CL 104 103  CO2 23 24  GLUCOSE 96 99  BUN 26* 24*  CREATININE 0.42* 0.41*  CALCIUM 10.7* 10.7*    Intake/Output Summary (Last 24 hours) at 12/05/2018 0906 Last data filed at 12/05/2018  0610 Gross per 24 hour  Intake 600 ml  Output 5550 ml  Net -4950 ml     Physical Exam: Vital Signs Blood pressure 119/80, pulse 87, temperature (!) 97.5 F (36.4 C), temperature source Oral, resp. rate 16, height 6\' 2"  (1.88 m), weight 73.5 kg, SpO2 97 %. Constitutional: No distress . Vital signs reviewed. HEENT: EOMI, oral membranes moist Neck: supple Cardiovascular: RRR without murmur. No JVD    Respiratory: CTA Bilaterally without wheezes or rales. Normal effort    GI: BS +,  tender, non-distended  Genitourinary:   SPC intact Musculoskeletal:  Comments: heel cords and left>right knee tight, limited PROM, LB tender. Left knee extension contracture. Left hip limited with PROM as well.  Neurological: He is alert. Oriented. Fair insight and awareness.  UE 3- to 3/5 deltoids, biceps, triceps, wrist/hands 3+/5. RLE 2/5 prox to distal. LLE with trace to 1/5 KE, HF, 0/5 otherwise. Decreased sensation left foot, dorsal and plantar surfaces, proximal leg with altered sensation as well. Less dysesthesias today.  Senses pain and light touch more normally in proximal LLE. RLE without obvious sensory findings. Motor and sensory stable 6/23 Skin: He isnot diaphoretic. Healed grafts bilateral upper thighs. Scarring, granulation.   Low back area/buttock with scattered pockets of graft, granulation/ hypergranulation tissue with drainage, dressing in place.  Psychiatric:  pleasant     Assessment/Plan: 1. Functional deficits secondary to debility, polytrauma  which require 3+ hours per day of interdisciplinary therapy in a comprehensive inpatient rehab setting.  Physiatrist is providing close team supervision and 24 hour management  of active medical problems listed below.  Physiatrist and rehab team continue to assess barriers to discharge/monitor patient progress toward functional and medical goals  Care Tool:  Bathing    Body parts bathed by patient: Left arm, Chest, Abdomen    Body parts bathed by helper: Right arm Body parts n/a: Buttocks   Bathing assist Assist Level: Minimal Assistance - Patient > 75%     Upper Body Dressing/Undressing Upper body dressing   What is the patient wearing?: Pull over shirt    Upper body assist Assist Level: Minimal Assistance - Patient > 75%    Lower Body Dressing/Undressing Lower body dressing    Lower body dressing activity did not occur: Safety/medical concerns What is the patient wearing?: Pants     Lower body assist Assist for lower body dressing: 2 Helpers     Toileting Toileting Toileting Activity did not occur Press photographer(Clothing management and hygiene only): Safety/medical concerns  Toileting assist       Transfers Chair/bed transfer  Transfers assist  Chair/bed transfer activity did not occur: Safety/medical concerns  Chair/bed transfer assist level: Dependent - mechanical lift(manual hoyer)     Locomotion Ambulation   Ambulation assist   Ambulation activity did not occur: Safety/medical concerns          Walk 10 feet activity   Assist           Walk 50 feet activity   Assist Walk 50 feet with 2 turns activity did not occur: Safety/medical concerns         Walk 150 feet activity   Assist Walk 150 feet activity did not occur: Safety/medical concerns         Walk 10 feet on uneven surface  activity   Assist Walk 10 feet on uneven surfaces activity did not occur: Safety/medical concerns         Wheelchair     Assist Will patient use wheelchair at discharge?: Yes Type of Wheelchair: Power Wheelchair activity did not occur: Safety/medical concerns  Wheelchair assist level: Supervision/Verbal cueing Max wheelchair distance: 150'    Wheelchair 50 feet with 2 turns activity    Assist        Assist Level: Supervision/Verbal cueing   Wheelchair 150 feet activity     Assist Wheelchair 150 feet activity did not occur: Safety/medical concerns   Assist  Level: Supervision/Verbal cueing    Medical Problem List and Plan: 1.Functional and mobility deficitssecondary to major multiple trauma including numerous soft tissue injuries, pelvic fractures, and left sciatic nerve injury -Continue CIR therapies including PT, OT, and SLP    -team conference today  -  therapy  3 hr/ 5 days per week 2. Antithrombotics: -DVT/anticoagulation:Pharmaceutical:Lovenox -antiplatelet therapy: N/A 3. Pain Management:Fentanyl 50 mcg/72 hrs with dilaudid prn for breakthrough pain.  -pain a little better overall  -needs repositioning prn for comfort  -limited ROM, pain left knee and hip---xrays of left hip and knee reviewed. his extensive fractures appear stable, hardware in place and generally everything in alignment. No signs of HO in knee or hip.  4. Mood:LCSW to follow for evaluation and support. Team to provide ego support. -antipsychotic agents: N/A  5. Neuropsych: This patientiscapable of making decisions on hisown behalf. 6.Multipledegloving injuries with necrosis/Skin/Wound Care:continue local care to ostomy and tube site. Dressings in place for back/buttock areas.  -WOC RN follow up appreciated.   -we changed dressing changes to BID given increased output from wounds  -may ask Plastics to see.  -PRAFO's for bilateral  heels Dressing changes to low back buttock area 3 times per week 7. Fluids/Electrolytes/Nutrition: D2 thins   -continue megace for appetite  -continue H2o flushes based on labs   -appetite improved   -holding TF for now  -recheck BMET, prealbumin tomorrow 8. Acute on chronic respiratory failure: Decannulated--stable on 3L oxygen per [place]. -stoma closed  -continue scheduled mucinex dm to help with cough and clearance of secretions---has helped  - continue flutter valve, IS 9.Recent SIRSdue to enterococcus WUJ:[id number]:Cefepime 6/7--6/14 and  macrodantin thorough 6/13 ---both complete 10.Urethral disruption/ SPCdependent: Monthly change and flush daily and prn to prevent blockage. 11. Anemia of chronic disease:  -iron panel c/w with ACD  -improve nutritional status  -recheck tomorrow 12. Hypernatremia:  water flushes continue  -improving 135 6/22 13. Tachycardia: Continue to monitor HR tid. Lopressor increased to 50 mg tid on 6/9 14.Encephalopathy:    -off provigil and amantadine 15. Anxiety disorder:Currently on multiple medications--Zoloft 25 mg with Seroquel and Klonopin 0.5 daily in addition to prn Klonopin.  -tapered seroquel further to 100mg  BID 6/22--reduce to 50mg  bid today  -further reduction as tolerated  -consider increase in zoloft as we taper other meds.  16.  Gastroparesis: reglan 17. Hypokalemia:    - kdur 40 BID  -follow up potassium 4.6, reduced kdur to 20mq bid     -check tomorrow  LOS: 12 days A FACE TO FACE EVALUATION WAS PERFORMED  Ranelle OysterZachary T Ibeth Fahmy 12/05/2018, 9:06 AM

## 2018-12-06 ENCOUNTER — Inpatient Hospital Stay (HOSPITAL_COMMUNITY): Payer: Self-pay | Admitting: Physical Therapy

## 2018-12-06 ENCOUNTER — Inpatient Hospital Stay (HOSPITAL_COMMUNITY): Payer: Self-pay | Admitting: Speech Pathology

## 2018-12-06 ENCOUNTER — Inpatient Hospital Stay (HOSPITAL_COMMUNITY): Payer: Self-pay | Admitting: Occupational Therapy

## 2018-12-06 LAB — COMPREHENSIVE METABOLIC PANEL
ALT: 90 U/L — ABNORMAL HIGH (ref 0–44)
AST: 36 U/L (ref 15–41)
Albumin: 2.6 g/dL — ABNORMAL LOW (ref 3.5–5.0)
Alkaline Phosphatase: 177 U/L — ABNORMAL HIGH (ref 38–126)
Anion gap: 9 (ref 5–15)
BUN: 22 mg/dL — ABNORMAL HIGH (ref 6–20)
CO2: 25 mmol/L (ref 22–32)
Calcium: 10.7 mg/dL — ABNORMAL HIGH (ref 8.9–10.3)
Chloride: 104 mmol/L (ref 98–111)
Creatinine, Ser: 0.42 mg/dL — ABNORMAL LOW (ref 0.61–1.24)
GFR calc Af Amer: >60 mL/min (ref >60)
GFR calc non Af Amer: >60 mL/min (ref >60)
Glucose, Bld: 124 mg/dL — ABNORMAL HIGH (ref 70–99)
Potassium: 4.1 mmol/L (ref 3.5–5.1)
Sodium: 138 mmol/L (ref 135–145)
Total Bilirubin: 0.4 mg/dL (ref 0.3–1.2)
Total Protein: 8.2 g/dL — ABNORMAL HIGH (ref 6.5–8.1)

## 2018-12-06 LAB — CBC
HCT: 34.9 % — ABNORMAL LOW (ref 39.0–52.0)
Hemoglobin: 10.7 g/dL — ABNORMAL LOW (ref 13.0–17.0)
MCH: 32.7 pg (ref 26.0–34.0)
MCHC: 30.7 g/dL (ref 30.0–36.0)
MCV: 106.7 fL — ABNORMAL HIGH (ref 80.0–100.0)
Platelets: 456 10*3/uL — ABNORMAL HIGH (ref 150–400)
RBC: 3.27 MIL/uL — ABNORMAL LOW (ref 4.22–5.81)
RDW: 14.4 % (ref 11.5–15.5)
WBC: 7.8 10*3/uL (ref 4.0–10.5)
nRBC: 0 % (ref 0.0–0.2)

## 2018-12-06 LAB — PREALBUMIN: Prealbumin: 26.4 mg/dL (ref 18–38)

## 2018-12-06 MED ORDER — QUETIAPINE FUMARATE 50 MG PO TABS
50.0000 mg | ORAL_TABLET | Freq: Two times a day (BID) | ORAL | Status: DC
  Administered 2018-12-06 – 2018-12-07 (×2): 50 mg via ORAL
  Filled 2018-12-06 (×2): qty 1

## 2018-12-06 MED ORDER — DIPHENHYDRAMINE HCL 12.5 MG/5ML PO ELIX: 12.5000 mg | ORAL_SOLUTION | Freq: Four times a day (QID) | ORAL | Status: DC | PRN

## 2018-12-06 MED ORDER — ALUM & MAG HYDROXIDE-SIMETH 200-200-20 MG/5ML PO SUSP: 30.0000 mL | ORAL | Status: DC | PRN

## 2018-12-06 MED ORDER — MELATONIN 3 MG PO TABS
3.0000 mg | ORAL_TABLET | Freq: Every day | ORAL | Status: DC
  Administered 2018-12-06 – 2018-12-13 (×8): 3 mg via ORAL
  Filled 2018-12-06 (×9): qty 1

## 2018-12-06 MED ORDER — POTASSIUM CHLORIDE CRYS ER 20 MEQ PO TBCR
20.0000 meq | EXTENDED_RELEASE_TABLET | Freq: Two times a day (BID) | ORAL | Status: DC
  Administered 2018-12-06 – 2018-12-11 (×9): 20 meq via ORAL
  Filled 2018-12-06 (×9): qty 1

## 2018-12-06 MED ORDER — PRO-STAT SUGAR FREE PO LIQD
60.0000 mL | Freq: Three times a day (TID) | ORAL | Status: DC
  Administered 2018-12-06 – 2018-12-11 (×14): 60 mL via ORAL
  Filled 2018-12-06 (×13): qty 60

## 2018-12-06 MED ORDER — POLYETHYLENE GLYCOL 3350 17 G PO PACK: 17.0000 g | PACK | Freq: Every day | ORAL | Status: DC | PRN

## 2018-12-06 MED ORDER — ADULT MULTIVITAMIN W/MINERALS CH
1.0000 | ORAL_TABLET | Freq: Every day | ORAL | Status: DC
  Administered 2018-12-07 – 2018-12-14 (×7): 1 via ORAL
  Filled 2018-12-06 (×8): qty 1

## 2018-12-06 MED ORDER — CLONAZEPAM 0.5 MG PO TABS: 0.5000 mg | ORAL_TABLET | Freq: Three times a day (TID) | ORAL | Status: DC | PRN

## 2018-12-06 MED ORDER — ACETAMINOPHEN 325 MG PO TABS
325.0000 mg | ORAL_TABLET | ORAL | Status: DC | PRN
  Filled 2018-12-06 (×2): qty 2

## 2018-12-06 MED ORDER — METOPROLOL TARTRATE 50 MG PO TABS
50.0000 mg | ORAL_TABLET | Freq: Three times a day (TID) | ORAL | Status: DC
  Administered 2018-12-07 – 2018-12-14 (×22): 50 mg via ORAL
  Filled 2018-12-06 (×22): qty 1

## 2018-12-06 MED ORDER — POLYETHYLENE GLYCOL 3350 17 G PO PACK
17.0000 g | PACK | Freq: Every day | ORAL | Status: DC
  Administered 2018-12-07 – 2018-12-13 (×4): 17 g via ORAL
  Filled 2018-12-06 (×6): qty 1

## 2018-12-06 MED ORDER — CLONAZEPAM 0.5 MG PO TABS
0.5000 mg | ORAL_TABLET | Freq: Every day | ORAL | Status: DC
  Administered 2018-12-07 – 2018-12-09 (×3): 0.5 mg via ORAL
  Filled 2018-12-06 (×3): qty 1

## 2018-12-06 MED ORDER — PROCHLORPERAZINE 25 MG RE SUPP: 12.5000 mg | Freq: Four times a day (QID) | RECTAL | Status: DC | PRN

## 2018-12-06 MED ORDER — CLONIDINE HCL 0.1 MG PO TABS
0.1000 mg | ORAL_TABLET | Freq: Two times a day (BID) | ORAL | Status: DC
  Administered 2018-12-06 – 2018-12-14 (×15): 0.1 mg via ORAL
  Filled 2018-12-06 (×15): qty 1

## 2018-12-06 MED ORDER — METOCLOPRAMIDE HCL 5 MG/5ML PO SOLN: 5.0000 mg | Freq: Three times a day (TID) | ORAL | Status: DC

## 2018-12-06 MED ORDER — METOCLOPRAMIDE HCL 5 MG PO TABS
5.0000 mg | ORAL_TABLET | Freq: Three times a day (TID) | ORAL | Status: DC
  Administered 2018-12-06 – 2018-12-07 (×4): 5 mg via ORAL
  Filled 2018-12-06 (×4): qty 1

## 2018-12-06 MED ORDER — PROCHLORPERAZINE MALEATE 5 MG PO TABS
5.0000 mg | ORAL_TABLET | Freq: Four times a day (QID) | ORAL | Status: DC | PRN
  Administered 2018-12-10: 10 mg via ORAL
  Administered 2018-12-11 – 2018-12-13 (×3): 5 mg via ORAL
  Filled 2018-12-06 (×3): qty 1
  Filled 2018-12-06: qty 2

## 2018-12-06 MED ORDER — GABAPENTIN 300 MG PO CAPS
300.0000 mg | ORAL_CAPSULE | Freq: Three times a day (TID) | ORAL | Status: DC
  Administered 2018-12-06 – 2018-12-14 (×23): 300 mg via ORAL
  Filled 2018-12-06 (×23): qty 1

## 2018-12-06 MED ORDER — GUAIFENESIN-DM 100-10 MG/5ML PO SYRP: 5.0000 mL | ORAL_SOLUTION | Freq: Four times a day (QID) | ORAL | Status: DC | PRN

## 2018-12-06 MED ORDER — PROCHLORPERAZINE EDISYLATE 10 MG/2ML IJ SOLN
5.0000 mg | Freq: Four times a day (QID) | INTRAMUSCULAR | Status: DC | PRN
  Administered 2018-12-10: 10 mg via INTRAMUSCULAR
  Filled 2018-12-06: qty 2

## 2018-12-06 MED ORDER — PANTOPRAZOLE SODIUM 40 MG PO TBEC
40.0000 mg | DELAYED_RELEASE_TABLET | Freq: Two times a day (BID) | ORAL | Status: DC
  Administered 2018-12-06 – 2018-12-14 (×15): 40 mg via ORAL
  Filled 2018-12-06 (×15): qty 1

## 2018-12-06 NOTE — Progress Notes (Signed)
Speech Language Pathology Weekly Progress and Session Note  Patient Details  Name: Christopher [name] MRN: [id number] Date of Birth: 09/07/1965  Beginning of progress report period: November 29, 2018 End of progress report period: December 06, 2018  Today's Date: 12/06/2018 SLP Individual Time: 0715-0815, 1200-1230 SLP Individual Time Calculation (min): 60 min, 30 min  Short Term Goals: Week 2: SLP Short Term Goal 1 (Week 2): Pt will consume trials of dysphagia 3 with minimal overt s/s of aspiration and decreased effort to complete oral phase over 3 sessions prior to diet upgrade. SLP Short Term Goal 1 - Progress (Week 2): Met SLP Short Term Goal 2 (Week 2): Pt will demonstrate selective attention to basic task for ~ 30 minutes with Min A cues. SLP Short Term Goal 2 - Progress (Week 2): Met SLP Short Term Goal 3 (Week 2): Pt will utilize external memory aids to recall date and salient events with supervision cues. SLP Short Term Goal 3 - Progress (Week 2): Met SLP Short Term Goal 4 (Week 2): Pt will complete basic functional problem solving tasks with Min A cues. SLP Short Term Goal 4 - Progress (Week 2): Met SLP Short Term Goal 5 (Week 2): Pt will describe object during barrier task in mildly noisy environment to achieve > 95% intelligibility. SLP Short Term Goal 5 - Progress (Week 2): Met    New Short Term Goals: Week 3: SLP Short Term Goal 1 (Week 3): Pt will consume trials of regular diet textures without self-reported increase in fatigue for 3 sessions prior to diet upgrade. SLP Short Term Goal 2 (Week 3): Given hypothetical situations within home and community environment, pt will provide appropriately safe solution in 8 out of 10 opportunities with supervision questions. SLP Short Term Goal 3 (Week 3): Pt will demonstrate selective attention for ~ 45 minutes in moderately distracting environment (to include physical distractions) with supervision cues.  Weekly Progress Updates: Pt has  made great progress over the last reporting period and as a result he has met 5 out of 5 STGs. Specifically he has mastered speech intelligibility and safe consumption of dysphagia 3 diet with thin liquids. Nursing to begin administering all medicine whole in puree or with thin liquids if tolerated.   Pt is likely approaching cognitive baseline, however it is appropriate to target anticipatory awareness within framework of returning home with physical impairments as well as targeting selective attention with internal distractions of pain/positioning in power chair. Targeting these areas will improve functional independence at home, increase pt's ability to remain in power chair for longer periods of time as well as reduce caregiver burden. At this time, don't anticipate that pt will require any follow up ST services.    Intensity: Minumum of 1-2 x/day, 30 to 90 minutes Frequency: 3 to 5 out of 7 days Duration/Length of Stay:   7/3 Treatment/Interventions:     Daily Session  Skilled Therapeutic Interventions:   Skilled treatment session focused on dysphagia and cognition goals. SLP facilitated session by providing skilled observation of pt consuming dysphagia 3 breakfast with improved endurance and 100% consumption. Additionally, session focused on establishing goals and POC for remainder of skilled ST sessions. New goals created. Plan also created for SLP to treat pt after OT this morning to promote additional time in chair and target selective attention while in chair.    Skilled treatment session #2 focused on cognition goals. SLP received pt upright in power-chair after OT session (pt had been up in chair  for 1 hour at this point). SLP provided navigational information for to to move throughout unit to various locations. Pt able to sequence thru directions and didn't hit anything with chair.  Pt required Min A to sequence transfer back to bed. Pt with increased endurance and appeared very proud  of staying chair for additional 30 minutes. Will continue to target.    General    Pain    Therapy/Group: Individual Therapy    12/06/2018, 11:39 AM

## 2018-12-06 NOTE — Progress Notes (Signed)
Social Work Patient ID: Christopher [name], male   DOB: 1965-11-11, 53 y.o.   MRN: [id number]   CSW met with pt 12-05-18 to update him on team conference discussion and targeted d/c date of 12-14-18.  Pt is pleased with this.  CSW then called pt's fiance, [name], and left her a message about setting up family education and preparing for d/c.  Await return call back to do those tasks.  CSW will continue to follow and work with the workers' comp Tourist information centre manager to arrange [facility] therapies and order DME.

## 2018-12-06 NOTE — Plan of Care (Signed)
  Problem: Consults Goal: RH GENERAL PATIENT EDUCATION Description: See Patient Education module for education specifics. Outcome: Progressing   Problem: RH BOWEL ELIMINATION Goal: RH STG MANAGE BOWEL WITH ASSISTANCE Description: STG Manage Bowel with max Assistance. Outcome: Progressing   Problem: RH BLADDER ELIMINATION Goal: RH STG MANAGE BLADDER WITH EQUIPMENT WITH ASSISTANCE Description: STG Manage Bladder With Equipment With  max Assistance Outcome: Progressing   Problem: RH SKIN INTEGRITY Goal: RH STG SKIN FREE OF INFECTION/BREAKDOWN Description: Manage with max assist Outcome: Progressing Goal: RH STG MAINTAIN SKIN INTEGRITY WITH ASSISTANCE Description: STG Maintain Skin Integrity With max Assistance. Outcome: Progressing Goal: RH STG ABLE TO PERFORM INCISION/WOUND CARE W/ASSISTANCE Description: STG Able To Perform Incision/Wound Care With max Assistance. Outcome: Progressing   Problem: RH SAFETY Goal: RH STG ADHERE TO SAFETY PRECAUTIONS W/ASSISTANCE/DEVICE Description: STG Adhere to Safety Precautions With supervision Assistance/Device. Outcome: Progressing   Problem: RH PAIN MANAGEMENT Goal: RH STG PAIN MANAGED AT OR BELOW PT'S PAIN GOAL Description: At or below level 4 Outcome: Progressing   Problem: RH KNOWLEDGE DEFICIT GENERAL Goal: RH STG INCREASE KNOWLEDGE OF SELF CARE AFTER HOSPITALIZATION Description: Family will be able to manage care with verbal direction of patient and using handouts/educational materials independently Outcome: Progressing   Problem: Nutritional: Goal: Ability to achieve adequate nutritional intake will improve Description: Family will be able to administer tube feedings , water flushes and manage PEG tube independently using handouts, educational materials and experience from hands on practice before discharge Outcome: Progressing   

## 2018-12-06 NOTE — Progress Notes (Signed)
PHYSICAL MEDICINE & REHABILITATION PROGRESS NOTE   Subjective/Complaints: Pt reports improving appetite. Pain fair. Left knee ROM better, therapy working on it aggressively  ROS: Patient denies fever, rash, sore throat, blurred vision, nausea, vomiting, diarrhea, cough, shortness of breath or chest pain,  headache, or mood change.    Objective:   No results found. Recent Labs    12/06/18 0530  WBC 7.8  HGB 10.7*  HCT 34.9*  PLT 456*   Recent Labs    12/04/18 0949 12/06/18 0530  NA 136 138  K 4.6 4.1  CL 103 104  CO2 24 25  GLUCOSE 99 124*  BUN 24* 22*  CREATININE 0.41* 0.42*  CALCIUM 10.7* 10.7*    Intake/Output Summary (Last 24 hours) at 12/06/2018 1043 Last data filed at 12/06/2018 0523 Gross per 24 hour  Intake 720 ml  Output 1950 ml  Net -1230 ml     Physical Exam: Vital Signs Blood pressure 117/78, pulse 78, temperature 99 F (37.2 C), temperature source Oral, resp. rate 17, height 6\' 2"  (1.88 m), weight 66.7 kg, SpO2 98 %. Constitutional: No distress . Vital signs reviewed. HEENT: EOMI, oral membranes moist Neck: supple Cardiovascular: RRR without murmur. No JVD    Respiratory: CTA Bilaterally without wheezes or rales. Normal effort    GI: BS +, non-tender, non-distended, PEG/ostomy Genitourinary:   SPC intact Musculoskeletal:  Comments: heel cords and left>right knee tight--able to bend knee to about 30-40 degrees now.  LB tender.  . Left hip limited with PROM as well.  Neurological: He is alert. Oriented. Fair insight and awareness.  UE 3- to 3/5 deltoids, biceps, triceps, wrist/hands 3+/5. RLE 2/5 prox to distal. LLE with trace to 1/5 KE, HF, 0/5 otherwise without change. Ongoing sensory loss LLE.  RLE without obvious sensory findings. Motor and sensory stable 6/23 Skin: He isnot diaphoretic. Healed grafts bilateral upper thighs. Scarring, granulation.   Low back area/buttock with scattered pockets of graft, granulation/  hypergranulation tissue with drainage, dressing in place without significant odor today.  Psychiatric:  Pleasant. More animated and engaging     Assessment/Plan: 1. Functional deficits secondary to debility, polytrauma  which require 3+ hours per day of interdisciplinary therapy in a comprehensive inpatient rehab setting.  Physiatrist is providing close team supervision and 24 hour management of active medical problems listed below.  Physiatrist and rehab team continue to assess barriers to discharge/monitor patient progress toward functional and medical goals  Care Tool:  Bathing    Body parts bathed by patient: Left arm, Chest, Abdomen   Body parts bathed by helper: Right arm Body parts n/a: Buttocks   Bathing assist Assist Level: Minimal Assistance - Patient > 75%     Upper Body Dressing/Undressing Upper body dressing   What is the patient wearing?: Pull over shirt    Upper body assist Assist Level: Minimal Assistance - Patient > 75%    Lower Body Dressing/Undressing Lower body dressing    Lower body dressing activity did not occur: Safety/medical concerns What is the patient wearing?: Pants     Lower body assist Assist for lower body dressing: 2 Helpers     Toileting Toileting Toileting Activity did not occur (Clothing management and hygiene only): Safety/medical concerns  Toileting assist       Transfers Chair/bed transfer  Transfers assist  Chair/bed transfer activity did not occur: Safety/medical concerns  Chair/bed transfer assist level: 2 Helpers     Locomotion Ambulation   Ambulation assist   Ambulation  activity did not occur: Safety/medical concerns          Walk 10 feet activity   Assist           Walk 50 feet activity   Assist Walk 50 feet with 2 turns activity did not occur: Safety/medical concerns         Walk 150 feet activity   Assist Walk 150 feet activity did not occur: Safety/medical concerns          Walk 10 feet on uneven surface  activity   Assist Walk 10 feet on uneven surfaces activity did not occur: Safety/medical concerns         Wheelchair     Assist Will patient use wheelchair at discharge?: Yes Type of Wheelchair: Power Wheelchair activity did not occur: Safety/medical concerns  Wheelchair assist level: Supervision/Verbal cueing Max wheelchair distance: 150'    Wheelchair 50 feet with 2 turns activity    Assist        Assist Level: Supervision/Verbal cueing   Wheelchair 150 feet activity     Assist Wheelchair 150 feet activity did not occur: Safety/medical concerns   Assist Level: Supervision/Verbal cueing    Medical Problem List and Plan: 1.Functional and mobility deficitssecondary to major multiple trauma including numerous soft tissue injuries, pelvic fractures, and left sciatic nerve injury --Continue CIR therapies including PT, OT SLP   -  therapy  3 hr/ 5 days per week 2. Antithrombotics: -DVT/anticoagulation:Pharmaceutical:Lovenox -antiplatelet therapy: N/A 3. Pain Management:Fentanyl 50 mcg/72 hrs with dilaudid prn for breakthrough pain.  -pain seems better overall  -needs repositioning prn for comfort  -limited ROM, pain left knee and hip---xrays of left hip and knee reviewed. his extensive fractures appear stable, hardware in place and generally everything in alignment. No signs of HO in knee or hip.   -continue ROM exercises with PT. No CPM at this time 4. Mood:LCSW to follow for evaluation and support. Team to provide ego support. -antipsychotic agents: N/A  5. Neuropsych: This patientiscapable of making decisions on hisown behalf. 6.Multipledegloving injuries with necrosis/Skin/Wound Care:continue local care to ostomy and tube site. Dressings in place for back/buttock areas.  -WOC RN follow up appreciated.   -we changed dressing changes to BID given increased  output from wounds  -consider plastics follow up  -PRAFO's for bilateral heels Dressing changes to low back buttock area 3 times per week 7. Fluids/Electrolytes/Nutrition: D2 thins   -continue megace for appetite  -continue H2o flushes and supplements   -appetite improved   -holding TF for now. Told him he needs to pick up intake, be more consistent however  -prealbumin 26.4 today, appetite picking up.  8. Acute on chronic respiratory failure: Decannulated--stable on 3L oxygen per . -stoma closed  -continue scheduled mucinex dm to help with cough and clearance of secretions---has helped  - continue flutter valve, IS 9.Recent SIRSdue to enterococcus WGN:[id number]:Cefepime 6/7--6/14 and macrodantin thorough 6/13 ---both complete 10.Urethral disruption/ SPCdependent: Monthly change and flush daily and prn to prevent blockage. 11. Anemia of chronic disease:  -iron panel c/w with ACD  -improve nutritional status  -hgb 10.7 12. Hypernatremia:  water flushes continue  -improving 135 6/22 13. Tachycardia: Continue to monitor HR tid. Lopressor increased to 50 mg tid on 6/9 14.Encephalopathy:    -off provigil and amantadine 15. Anxiety disorder:Currently on multiple medications--Zoloft 25 mg with Seroquel and Klonopin 0.5 daily in addition to prn Klonopin.  -tapered seroquel further to 100mg  BID 6/22--reduce to 50mg  bid 6/24  -further reduction likely  tomorrow  -consider increase in zoloft as we taper other meds.  16.  Gastroparesis: reglan 17. Hypokalemia:    -follow up potassium 4.1, reduced kdur to 27me q bid beginning today        LOS: 13 days A FACE TO FACE EVALUATION WAS PERFORMED  Meredith Staggers 12/06/2018, 10:43 AM

## 2018-12-06 NOTE — Progress Notes (Signed)
Physical Therapy Session Note  Patient Details  Name: Christopher [name] MRN: [id number] Date of Birth: 1965/12/24  Today's Date: 12/06/2018 PT Individual Time: 1415-1530 PT Individual Time Calculation (min): 75 min   Short Term Goals: Week 2:  PT Short Term Goal 1 (Week 2): Pt will complete least restrictive transfer with max A x 1 PT Short Term Goal 2 (Week 2): Pt will initiate w/c mobility PT Short Term Goal 3 (Week 2): Pt will tolerate sitting OOB in w/c x 1 hour  Skilled Therapeutic Interventions/Progress Updates:    Pt received supine in bed, agreeable to PT session. Pt reports back pain at rest, not rated, unable to receive pain medication until later this PM. Supine to sit with max A x 1 from flat bed, pt demos improved ability to elevate trunk from bed. Sit to stand with assist x 2, 3 muskateers. Stand pivot transfer bed to w/c with assist x 2, 3 muskateers. Power w/c mobility 2 x 150 ft with Supervision. Session focus on retesting B UE/LE strength and ROM for wheelchair evaluation form. Pt exhibits limited shoulder flexion and abduction and decreased B grip strength L>R with otherwise BUE AROM and strength WFL. Pt exhibits limited B hip, knee, and ankle ROM with L more limited than R. Pt exhibits overall 4/5 RLE strength and 2-3/5 LLE. Pt requests to return to bed at end of session. Stand pivot transfer via 3 muskateers assist x 2. Sit to supine max A. RN in at end of session to provide pain medication. Pt left sidelying on R side at end of session with needs in reach.  Therapy Documentation Precautions:  Precautions Precautions: Fall, Other (comment) Precaution Comments: PEG, ostomy, SP catheter, multiple healing skin grafts/wounds periarea and B LEs Restrictions Weight Bearing Restrictions: No RLE Weight Bearing: Weight bearing as tolerated LLE Weight Bearing: Weight bearing as tolerated    Therapy/Group: Individual Therapy   [name], PT, DPT  12/06/2018, 3:45 PM

## 2018-12-06 NOTE — Progress Notes (Signed)
Occupational Therapy Weekly Progress Note  Patient Details  Name: Christopher [name] MRN: [id number] Date of Birth: [birth year]  Beginning of progress report period: November 30, 2018 End of progress report period: December 06, 2018  Today's Date: 12/06/2018 OT Individual Time: 1100-1155 OT Individual Time Calculation (min): 55 min    Patient has met 1 of 3 short term goals.  Pt cont to make slow progress towards OT goals despite motivation. He is most limited by pain, specifically at wound site on lower back/sacrrum which impacts his ability to tolerate time OOB as well as functional sitting position. Trialiing various forms of transfer techniques including sliding board, squat pivot, stand pivot and use of manual [place] as options for d/c . Pt was able to tolerate positioning in manual [place] and this will likely be the method we recommend for family to use at d/c. Will cont to work on sit>stand and stand pivot transfers as part of LE strengthening, building upright tolerance, and in prep for functional transfers at this level.  Planning for pt's family to come in for family ed next week prior to d/c home.   Patient continues to demonstrate the following deficits: muscle weakness, decreased cardiorespiratoy endurance, decreased problem solving and decreased memory and decreased sitting balance, decreased standing balance, decreased postural control and decreased balance strategies and therefore will continue to benefit from skilled OT intervention to enhance overall performance with BADL and Reduce care partner burden.  Patient progressing toward long term goals..  Plan of care revisions: Goals modifed during reporting period. Standing goals and LB dressing goals d/c. See POC for goal details. .  OT Short Term Goals Week 2:  OT Short Term Goal 1 (Week 2): Pt will transfer with max-total A +1 in order to reduce caregiver burden OT Short Term Goal 1 - Progress (Week 2): Not met OT Short Term Goal 2 (Week  2): Pt will independently direct care with bed mobility techniques during ADL task. OT Short Term Goal 2 - Progress (Week 2): Met OT Short Term Goal 3 (Week 2): Pt will tolerate sitting EOB/EOM with supervision for 5 minutes in prep for functional tasks/transfer OT Short Term Goal 3 - Progress (Week 2): Not met Week 3:  OT Short Term Goal 1 (Week 3): STG=LTG due to LOS  Skilled Therapeutic Interventions/Progress Updates:    Pt seen for OT session focusing on ADL re-training and sit>stand in prep for functional tasks. Pt in supine upon arrival, denied pain at rest and agreeable to tx session. He transferred to sitting EOB with mod A +1 using hospital bed functions. Completed transfer to power w/c, ambulating ~13f to power w/c. +2 3 musketeer style transfer with max VCs for sequencing and assist ti weight shift. Min A for positioning/ advancement of L LE. He completed UB bathing/dressing from w/c level at sink. VCs for doffing/donning shirt for technique that allowed py to complete without physical assist. He washed UB with supervision, assist required for anterior weight shift in order for therapist to wash back.  Pt managed power w/c throughout unit with supervision. In therapy gym, completed x4 in total sit>stand from power w/c. +2 required to power into standing. He was unable to anteriorly shift in order to get trunk over legs due to extension of LEs and limited pelvic mobility. Pt only tolerating ~5-10 seconds with each stand prior to needing seated rest break. Pt returned to room at end of session, left sitting up in power w/c in prep for ST session.  Therapy Documentation Precautions:  Precautions Precautions: Fall, Other (comment) Precaution Comments: PEG, ostomy, SP catheter, multiple healing skin grafts/wounds periarea and B LEs Restrictions Weight Bearing Restrictions: No RLE Weight Bearing: Weight bearing as tolerated LLE Weight Bearing: Weight bearing as  tolerated   Therapy/Group: Individual Therapy  Malayia Spizzirri L 12/06/2018, 7:01 AM

## 2018-12-07 ENCOUNTER — Inpatient Hospital Stay (HOSPITAL_COMMUNITY): Payer: Self-pay | Admitting: Speech Pathology

## 2018-12-07 ENCOUNTER — Inpatient Hospital Stay (HOSPITAL_COMMUNITY): Payer: Self-pay | Admitting: Occupational Therapy

## 2018-12-07 ENCOUNTER — Inpatient Hospital Stay (HOSPITAL_COMMUNITY): Payer: Self-pay

## 2018-12-07 ENCOUNTER — Inpatient Hospital Stay (HOSPITAL_COMMUNITY): Payer: Self-pay | Admitting: Physical Therapy

## 2018-12-07 MED ORDER — FREE WATER
250.0000 mL | Freq: Three times a day (TID) | Status: DC
  Administered 2018-12-07 – 2018-12-08 (×2): 250 mL

## 2018-12-07 MED ORDER — QUETIAPINE FUMARATE 25 MG PO TABS
25.0000 mg | ORAL_TABLET | Freq: Two times a day (BID) | ORAL | Status: DC
  Administered 2018-12-07 – 2018-12-09 (×4): 25 mg via ORAL
  Filled 2018-12-07 (×4): qty 1

## 2018-12-07 MED ORDER — METOCLOPRAMIDE HCL 5 MG PO TABS
5.0000 mg | ORAL_TABLET | Freq: Three times a day (TID) | ORAL | Status: DC
  Administered 2018-12-07 – 2018-12-08 (×3): 5 mg via ORAL
  Filled 2018-12-07 (×3): qty 1

## 2018-12-07 NOTE — Progress Notes (Signed)
Occupational Therapy Session Note  Patient Details  Name: Christopher [name] MRN: [id number] Date of Birth: 01/04/1966  Today's Date: 12/07/2018 OT Individual Time: 1415-1515 OT Individual Time Calculation (min): 60 min    Short Term Goals: Week 1:  OT Short Term Goal 1 (Week 1): Pt will complete 1 grooming task w/c level at sink to increase OOB tolerance OT Short Term Goal 1 - Progress (Week 1): Met OT Short Term Goal 2 (Week 1): Pt will complete LB bathing with Max A of 1 helper OT Short Term Goal 2 - Progress (Week 1): Not met  Skilled Therapeutic Interventions/Progress Updates:    1:1. Pt received in bed with pain in back but reporting not time for meds. Pt agreeable to OT intervention. Pt completes supine>sitting with MAX A. Pt completes stand pivot transfer with MIN-MOD A for 2 using 3 muskateers style EOB<>PWC. Pt completes w/c mobility to/from all tx destinations with Supervision. Pt completes 3 sit to stands at high low table with VC for weight shifting forward and laterally 3 muskateers. Pt competes 2# dowel rod theres shoulder felx/ext, elbow flex/ext and chest press 2x10 for BUE strengthening required for BADLs. Exited session with pt seated in bed, exit alarm on and call light in reach  Therapy Documentation Precautions:  Precautions Precautions: Fall, Other (comment) Precaution Comments: PEG, ostomy, SP catheter, multiple healing skin grafts/wounds periarea and B LEs Restrictions Weight Bearing Restrictions: No RLE Weight Bearing: Weight bearing as tolerated LLE Weight Bearing: Weight bearing as tolerated General:   Vital Signs:   Pain:   ADL: ADL Eating: (NPO) Grooming: Moderate assistance Upper Body Bathing: Moderate assistance Where Assessed-Upper Body Bathing: Edge of bed Lower Body Bathing: Other (comment)(2 assist) Where Assessed-Lower Body Bathing: Bed level Upper Body Dressing: Moderate assistance Where Assessed-Upper Body Dressing: Edge of bed Lower  Body Dressing: Dependent Where Assessed-Lower Body Dressing: Bed level Toileting: Other (Comment)(NA due to ostomy and SP catheter) Tub/Shower Transfer: Not assessed Vision   Perception    Praxis   Exercises:   Other Treatments:     Therapy/Group: Individual Therapy   M  12/07/2018, 3:14 PM  

## 2018-12-07 NOTE — Progress Notes (Signed)
Physical Therapy Session Note  Patient Details  Name: Christopher [name] MRN: [id number] Date of Birth: 1965-09-09  Today's Date: 12/07/2018 PT Individual Time: 0905-1000 PT Individual Time Calculation (min): 55 min   Short Term Goals: Week 2:  PT Short Term Goal 1 (Week 2): Pt will complete least restrictive transfer with max A x 1 PT Short Term Goal 2 (Week 2): Pt will initiate w/c mobility PT Short Term Goal 3 (Week 2): Pt will tolerate sitting OOB in w/c x 1 hour  Skilled Therapeutic Interventions/Progress Updates:    Pt received seated in power w/c in therapy gym from previous therapy session. Agreeable to PT session. Pt reports pain in low back/buttocks region that increases in intensity while seated in w/c throughout session, not rated and declines any intervention. Education with pt about use of tilt and recline features on w/c for pressure relief while seated up in chair. [name], [employer] present for power wheelchair mobility assessment. Pt completes assessment and is able to verbalize preferences with regards to positioning, etc with regards to his power wheelchair. Education with pt about options for elevating leg rests for improved comfort and fit as well as trunk and other LE postioning options. Pt is able to provide information about his home setup and family is completing home measurement sheet for doorway widths, etc. Pt requests to return to bed at end of session. Three muskateers assist x 2 for sit to stand and pt is able to take a few steps from w/c to bed. Sit to supine max A. Pt left sidelying on L side in bed with needs in reach at end of session.  Therapy Documentation Precautions:  Precautions Precautions: Fall, Other (comment) Precaution Comments: PEG, ostomy, SP catheter, multiple healing skin grafts/wounds periarea and B LEs Restrictions Weight Bearing Restrictions: No RLE Weight Bearing: Weight bearing as tolerated LLE Weight Bearing: Weight bearing as  tolerated    Therapy/Group: Individual Therapy   [name], PT, DPT  12/07/2018, 12:01 PM

## 2018-12-07 NOTE — Progress Notes (Signed)
Speech Language Pathology Daily Session Note  Patient Details  Name: Christopher [name] MRN: [id number] Date of Birth: 1965/11/05  Today's Date: 12/07/2018 SLP Individual Time: 6384-6659 SLP Individual Time Calculation (min): 45 min  Short Term Goals: Week 3: SLP Short Term Goal 1 (Week 3): Pt will consume trials of regular diet textures without self-reported increase in fatigue for 3 sessions prior to diet upgrade. SLP Short Term Goal 2 (Week 3): Given hypothetical situations within home and community environment, pt will provide appropriately safe solution in 8 out of 10 opportunities with supervision questions. SLP Short Term Goal 3 (Week 3): Pt will demonstrate selective attention for ~ 45 minutes in moderately distracting environment (to include physical distractions) with supervision cues.  Skilled Therapeutic Interventions:  Skilled treatment session focused on dysphagia and cognition goals. SLP facilitated session by providing skilled observation of consuming regular trials without any deficits in endurance or aspiration. Recommend upgrade to regular with MD establishing plan to pull PEG tube if pt consumes 100% of all meals over next 5 days. SLP also provided moderate physical distractions while pt ordering meals with dinning embassor. Pt able to demonstrate selective attention for ~ 45 minutes with Mod I.       Pain    Therapy/Group: Individual Therapy  Valyncia Wiens 12/07/2018, 3:13 PM

## 2018-12-07 NOTE — Progress Notes (Signed)
[place] PHYSICAL MEDICINE & REHABILITATION PROGRESS NOTE   Subjective/Complaints:  pt in good spirits. No new complaints. Pain seems to be better. Tolerating more activity  ROS: Patient denies fever, rash, sore throat, blurred vision, nausea, vomiting, diarrhea, cough, shortness of breath or chest pain,  headache, or mood change.      Objective:   No results found. Recent Labs    12/06/18 0530  WBC 7.8  HGB 10.7*  HCT 34.9*  PLT 456*   Recent Labs    12/06/18 0530  NA 138  K 4.1  CL 104  CO2 25  GLUCOSE 124*  BUN 22*  CREATININE 0.42*  CALCIUM 10.7*    Intake/Output Summary (Last 24 hours) at 12/07/2018 1109 Last data filed at 12/07/2018 0732 Gross per 24 hour  Intake 600 ml  Output 2700 ml  Net -2100 ml     Physical Exam: Vital Signs Blood pressure 125/80, pulse 80, temperature 98.3 F (36.8 C), resp. rate 16, height 6\' 2"  (1.88 m), weight 66.7 kg, SpO2 98 %. Constitutional: No distress . Vital signs reviewed. HEENT: EOMI, oral membranes moist Neck: supple Cardiovascular: RRR without murmur. No JVD    Respiratory: CTA Bilaterally without wheezes or rales. Normal effort    GI: BS +, non-tender, non-distended, ostomy/PEG intact Genitourinary:   SPC intact Musculoskeletal:  Comments: heel cords and left>right knee tight--able to bend knee to about 30-40 degrees now.  LB tender.  . Left hip limited with PROM as well.  Neurological: He is alert and o x 3.  UE 3- to 3/5 deltoids, biceps, triceps, wrist/hands 3+/5. RLE 2/5 prox to distal. LLE with trace to 1-2/5 KE, HF, 0/5 otherwise without change. Ongoing patchy more sciatic distribution sensory loss LLE.  RLE without obvious sensory findings.  Skin: He isnot diaphoretic. Healed grafts bilateral upper thighs. Scarring, granulation.   Low back area/buttock with scattered pockets of graft, granulation/ hypergranulation tissue with drainage, dressing in place.  Psychiatric:  Pleasant and  cooperative     Assessment/Plan: 1. Functional deficits secondary to debility, polytrauma  which require 3+ hours per day of interdisciplinary therapy in a comprehensive inpatient rehab setting.  Physiatrist is providing close team supervision and 24 hour management of active medical problems listed below.  Physiatrist and rehab team continue to assess barriers to discharge/monitor patient progress toward functional and medical goals  Care Tool:  Bathing    Body parts bathed by patient: Right arm, Left arm, Chest, Abdomen, Face   Body parts bathed by helper: Right arm Body parts n/a: Front perineal area, Buttocks, Right upper leg, Left upper leg, Right lower leg, Left lower leg(Did not complete LB this session)   Bathing assist Assist Level: Contact Guard/Touching assist     Upper Body Dressing/Undressing Upper body dressing   What is the patient wearing?: Pull over shirt    Upper body assist Assist Level: Supervision/Verbal cueing    Lower Body Dressing/Undressing Lower body dressing    Lower body dressing activity did not occur: Safety/medical concerns What is the patient wearing?: Pants     Lower body assist Assist for lower body dressing: Total Assistance - Patient < 25%     Toileting Toileting Toileting Activity did not occur Press photographer(Clothing management and hygiene only): Safety/medical concerns  Toileting assist       Transfers Chair/bed transfer  Transfers assist  Chair/bed transfer activity did not occur: Safety/medical concerns  Chair/bed transfer assist level: 2 Hospital doctorHelpers     Locomotion Ambulation   Ambulation  assist   Ambulation activity did not occur: Safety/medical concerns          Walk 10 feet activity   Assist           Walk 50 feet activity   Assist Walk 50 feet with 2 turns activity did not occur: Safety/medical concerns         Walk 150 feet activity   Assist Walk 150 feet activity did not occur: Safety/medical  concerns         Walk 10 feet on uneven surface  activity   Assist Walk 10 feet on uneven surfaces activity did not occur: Safety/medical concerns         Wheelchair     Assist Will patient use wheelchair at discharge?: Yes Type of Wheelchair: Power Wheelchair activity did not occur: Safety/medical concerns  Wheelchair assist level: Supervision/Verbal cueing Max wheelchair distance: 150'    Wheelchair 50 feet with 2 turns activity    Assist        Assist Level: Supervision/Verbal cueing   Wheelchair 150 feet activity     Assist Wheelchair 150 feet activity did not occur: Safety/medical concerns   Assist Level: Supervision/Verbal cueing    Medical Problem List and Plan: 1.Functional and mobility deficitssecondary to major multiple trauma including numerous soft tissue injuries, pelvic fractures, and left sciatic nerve injury --Continue CIR therapies including PT, OT SLP   - therapy  3 hr/ 5 days per week  -family ed 2. Antithrombotics: -DVT/anticoagulation:Pharmaceutical:Lovenox -antiplatelet therapy: N/A 3. Pain Management:Fentanyl 50 mcg/72 hrs with dilaudid prn for breakthrough pain.  -pain seems better overall  -needs repositioning prn for comfort  -limited ROM, pain left knee and hip---xrays of left hip and knee reveal extensive fractures appear stable, hardware in place and generally everything in alignment. No signs of HO in knee or hip.   -continue ROM exercises with PT. No CPM at this time 4. Mood:LCSW to follow for evaluation and support. Team to provide ego support. -antipsychotic agents: N/A  5. Neuropsych: This patientiscapable of making decisions on hisown behalf. 6.Multipledegloving injuries with necrosis/Skin/Wound Care:continue local care to ostomy and tube site. Dressings in place for back/buttock areas.  -WOC RN follow up appreciated.   -  dressing changes to BID  given increased output from wounds  -consider plastics follow up  -PRAFO's for bilateral heels  -I clipped all of his toenails today    7. Fluids/Electrolytes/Nutrition: D2 thins   -continue megace for appetite  -continue H2o flushes and supplements   -appetite improved   -holding TF for now. Told him he needs to pick up intake, be more consistent however  -prealbumin 26.4 today, appetite picking up.  8. Acute on chronic respiratory failure: Decannulated--stable on 3L oxygen per [place]. -stoma closed  -continue scheduled mucinex dm to help with cough and clearance of secretions---has helped  - continue flutter valve, IS 9.Recent SIRSdue to enterococcus [id number]:Cefepime 6/7--6/14 and macrodantin thorough 6/13 ---both complete 10.Urethral disruption/ SPCdependent: Monthly change and flush daily and prn to prevent blockage. 11. Anemia of chronic disease:  -iron panel c/w with ACD  -improve nutritional status  -hgb 10.7 12. Hypernatremia:  water flushes continue  -improving 135 6/22 13. Tachycardia: Continue to monitor HR tid. Lopressor increased to 50 mg tid on 6/9 14.Encephalopathy:    -off provigil and amantadine 15. Anxiety disorder:Currently on multiple medications--Zoloft 25 mg with Seroquel and Klonopin 0.5 daily in addition to prn Klonopin.  -taper seroquel to 25mg  bid today  -consider increase in  zoloft as we taper other meds.   -mood improving as meds are tapered 16.  Gastroparesis: reglan 17. Hypokalemia:    -follow up potassium 4.1, reduced kdur to 72me q bid         LOS: 14 days A FACE TO [place] T Natividad Schlosser 12/07/2018, 11:09 AM

## 2018-12-07 NOTE — Progress Notes (Signed)
Physical Therapy Session Note  Patient Details  Name: Christopher [name] MRN: [id number] Date of Birth: [birth year]  Today's Date: 12/07/2018 PT Individual Time: 0830-0900 PT Individual Time Calculation (min): 30 min   Short Term Goals: Week 2:  PT Short Term Goal 1 (Week 2): Pt will complete least restrictive transfer with max A x 1 PT Short Term Goal 2 (Week 2): Pt will initiate w/c mobility PT Short Term Goal 3 (Week 2): Pt will tolerate sitting OOB in w/c x 1 hour  Skilled Therapeutic Interventions/Progress Updates:    Session focused on functional mobility retraining including bed mobility and transfers and NMR for transitional movements and gait while addressing overall functional endurance and activity tolerance for upright mobility. Pt requires mod/max assist to come to EOB and position at EOB with min assist for sitting balance initially. Pt able to assist with utilization of bed rails for support. Sit <> stand from elevated EOB with +2 assist for forward weightshift (3 muskateers style) and support. Short distance gait x 5' to w/c with 3 muskateer style with facilitation for weightshifting and postural control. Pt able to demonstrate independent advancement of LLE during gait but noted to have very narrow BOS and small step length bilaterally. Requires assist to control descent into the power w/c. Pt able to drive w/c down to therapy gym at modified independent level including navigating in room and hallways. Fit pt with bilateral PFRW and practice stand step transfer to mat (with ROHO cushion under bottom on mat for increased sitting tolerance) with mod assist for sit <> stand from elevated power w/c and facilitation for anterior weightshift. Progressed gait x 10' with min assist overall except mod +2 for turning and management of PFRW during turning. Pt with posterior bias in standing during initial sit <> stands and first bout of gait, but improved with PFRW and verbal and tactile cues for  upright posture to facilitate trunk over COG for improved balance. Pt requires extended rest breaks between mobility attempts due to significant endurance deficits. Discussed overall goals and progression of mobility. Hand off to next PT for seating evaluation.   Therapy Documentation Precautions:  Precautions Precautions: Fall, Other (comment) Precaution Comments: PEG, ostomy, SP catheter, multiple healing skin grafts/wounds periarea and B LEs Restrictions Weight Bearing Restrictions: No RLE Weight Bearing: Weight bearing as tolerated LLE Weight Bearing: Weight bearing as tolerated   Pain:  Premedicated for pain in back and LLE - repositioned and rest breaks given as needed.     Therapy/Group: Individual Therapy  Canary Brim Ivory Broad, PT, DPT, CBIS  12/07/2018, 1:55 PM

## 2018-12-08 ENCOUNTER — Inpatient Hospital Stay (HOSPITAL_COMMUNITY): Payer: Self-pay | Admitting: Physical Therapy

## 2018-12-08 ENCOUNTER — Inpatient Hospital Stay (HOSPITAL_COMMUNITY): Payer: Self-pay | Admitting: Occupational Therapy

## 2018-12-08 MED ORDER — SENNOSIDES-DOCUSATE SODIUM 8.6-50 MG PO TABS
2.0000 | ORAL_TABLET | Freq: Every day | ORAL | Status: DC
  Administered 2018-12-08 – 2018-12-13 (×6): 2 via ORAL
  Filled 2018-12-08 (×6): qty 2

## 2018-12-08 NOTE — Progress Notes (Signed)
Occupational Therapy Session Note  Patient Details  Name: Christopher [name] MRN: [id number] Date of Birth: [birth year]  Today's Date: 12/08/2018 OT Individual Time: 1130-1200 OT Individual Time Calculation (min): 30 min    Short Term Goals: Week 1:  OT Short Term Goal 1 (Week 1): Pt will complete 1 grooming task w/c level at sink to increase OOB tolerance OT Short Term Goal 1 - Progress (Week 1): Met OT Short Term Goal 2 (Week 1): Pt will complete LB bathing with Max A of 1 helper OT Short Term Goal 2 - Progress (Week 1): Not met  Skilled Therapeutic Interventions/Progress Updates:    1:! PT asleep in bed when arrived. Pt participated in getting up and to the power chair and agreeable to sitting up through lunch. Pt required mod A to come to EOB from supine with min cues for sequencing and body positioning. Pt performed sit to stand from elevated surface with modA and ambulated from bed to power chair ~6 feet with bilateral platform RW with mod A - min for balance and A for management of RW. Pt with difficulty with decent of stand to sit due to decr ability to flex forward due to his back.   Pt cruised around the unit for a while and engaged in eating lunch in the chair for ~1.5 hours. Returned after lunch and assisted back to bed. Max A to sit to stand from power chair and mod for transfer with PFRW and mod for controlled decent. Returned to supine with mod A. Pt appreciated and glad to be better tolerating the chair for longer periods of time.   Therapy Documentation Precautions:  Precautions Precautions: Fall, Other (comment) Precaution Comments: PEG, ostomy, SP catheter, multiple healing skin grafts/wounds periarea and B LEs Restrictions Weight Bearing Restrictions: No RLE Weight Bearing: Weight bearing as tolerated LLE Weight Bearing: Weight bearing as tolerated Pain: Ongoing back pain- can work through it during transfers but needs rest breaks  Therapy/Group: Individual  Therapy  [facility] 12/08/2018, 2:32 PM

## 2018-12-08 NOTE — Plan of Care (Signed)
  Problem: Consults Goal: RH GENERAL PATIENT EDUCATION Description: See Patient Education module for education specifics. Outcome: Progressing   Problem: RH BOWEL ELIMINATION Goal: RH STG MANAGE BOWEL WITH ASSISTANCE Description: STG Manage Bowel with max Assistance. Outcome: Progressing   Problem: RH BLADDER ELIMINATION Goal: RH STG MANAGE BLADDER WITH EQUIPMENT WITH ASSISTANCE Description: STG Manage Bladder With Equipment With  max Assistance Outcome: Progressing   Problem: RH SKIN INTEGRITY Goal: RH STG SKIN FREE OF INFECTION/BREAKDOWN Description: Manage with max assist Outcome: Progressing Goal: RH STG MAINTAIN SKIN INTEGRITY WITH ASSISTANCE Description: STG Maintain Skin Integrity With max Assistance. Outcome: Progressing Goal: RH STG ABLE TO PERFORM INCISION/WOUND CARE W/ASSISTANCE Description: STG Able To Perform Incision/Wound Care With max Assistance. Outcome: Progressing   Problem: RH SAFETY Goal: RH STG ADHERE TO SAFETY PRECAUTIONS W/ASSISTANCE/DEVICE Description: STG Adhere to Safety Precautions With supervision Assistance/Device. Outcome: Progressing   Problem: RH PAIN MANAGEMENT Goal: RH STG PAIN MANAGED AT OR BELOW PT'S PAIN GOAL Description: At or below level 4 Outcome: Progressing   Problem: RH KNOWLEDGE DEFICIT GENERAL Goal: RH STG INCREASE KNOWLEDGE OF SELF CARE AFTER HOSPITALIZATION Description: Family will be able to manage care with verbal direction of patient and using handouts/educational materials independently Outcome: Progressing   Problem: Nutritional: Goal: Ability to achieve adequate nutritional intake will improve Description: Family will be able to administer tube feedings , water flushes and manage PEG tube independently using handouts, educational materials and experience from hands on practice before discharge Outcome: Progressing   

## 2018-12-08 NOTE — Progress Notes (Signed)
Physical Therapy Session Note  Patient Details  Name: Christopher [name] MRN: [id number] Date of Birth: [birth year]  Today's Date: 12/08/2018 PT Individual Time: 1026-1058 PT Individual Time Calculation (min): 32 min   Short Term Goals: Week 2:  PT Short Term Goal 1 (Week 2): Pt will complete least restrictive transfer with max A x 1 PT Short Term Goal 2 (Week 2): Pt will initiate w/c mobility PT Short Term Goal 3 (Week 2): Pt will tolerate sitting OOB in w/c x 1 hour  Skilled Therapeutic Interventions/Progress Updates:    Pt received semi-sidelying in bed and agreeable to therapy session. Performed 2x10 R LE heel slides with assist, hip abduction/adduction with assist and x15 B LE ankle PF/DF. Pt demonstrates significantly decreased ROM in hips/knees of B LEs (L>R) resulting in limited movement during exercises - therapist performed PROM on L LE hip flexion, knee flexion, and hip abduction to pt's pain tolerance. Pt able to roll R using bedrails with min assist for L LE management. Rolled L using bedrails with mod assist for B LE management. Sidelying>sitting EOB with mod assist for B LE management and trunk upright. Sit>stand from very elevated EOM>PFRW with max assist of 1 for lifting into standing (2nd person present for safety) - due to lack of L LE ROM pt unable to functionally flex L knee for improved position of foot prior to coming to stand. Pt tolerates standing with B UE support on PFRW with min assist for balance for ~1-2 minutes prior to returning to sit. Sit>supine with mod assist for B LE management. Returned to R sidelying for increased comfort and decreased low back pain. Pt left R sidelying in bed with needs in reach and bed alarm on.  Therapy Documentation Precautions:  Precautions Precautions: Fall, Other (comment) Precaution Comments: PEG, ostomy, SP catheter, multiple healing skin grafts/wounds periarea and B LEs Restrictions Weight Bearing Restrictions: No RLE Weight  Bearing: Weight bearing as tolerated LLE Weight Bearing: Weight bearing as tolerated  Pain: Vocalized increased pain during LE exercises and PROM - performed to pt tolerance with rest breaks as needed. Reports most significant low back pain is going from sidelying>sitting EOB with therapist educating on breathing techniques for pain relief and providing rest break prior to progressing to stand.   Therapy/Group: Individual Therapy  Tawana Scale, PT, DPT 12/08/2018, 7:54 AM

## 2018-12-08 NOTE — Progress Notes (Signed)
[place] PHYSICAL MEDICINE & REHABILITATION PROGRESS NOTE   Subjective/Complaints: Pt doing well. Moving better with therapy. Took some steps yesterday!  ROS: Patient denies fever, rash, sore throat, blurred vision, nausea, vomiting, diarrhea, cough, shortness of breath or chest pain,  headache, or mood change.   Objective:   No results found. Recent Labs    12/06/18 0530  WBC 7.8  HGB 10.7*  HCT 34.9*  PLT 456*   Recent Labs    12/06/18 0530  NA 138  K 4.1  CL 104  CO2 25  GLUCOSE 124*  BUN 22*  CREATININE 0.42*  CALCIUM 10.7*    Intake/Output Summary (Last 24 hours) at 12/08/2018 1121 Last data filed at 12/08/2018 0900 Gross per 24 hour  Intake 960 ml  Output 2350 ml  Net -1390 ml     Physical Exam: Vital Signs Blood pressure 122/79, pulse 82, temperature 98 F (36.7 C), temperature source Oral, resp. rate 20, height 6\' 2"  (1.88 m), weight 63 kg, SpO2 97 %. Constitutional: No distress . Vital signs reviewed. HEENT: EOMI, oral membranes moist Neck: supple Cardiovascular: RRR without murmur. No JVD    Respiratory: CTA Bilaterally without wheezes or rales. Normal effort    GI: BS +, non-tender, non-distended, PEG, Ostomy  Genitourinary:   SPC intact Musculoskeletal:  Comments: heel cords and left>right knee tight--able to bend knee to about 30-40 degrees now.  LB tender.  . Left hip limited with PROM as well.  Neurological: He is alert and o x 3.  UE 3- to 3/5 deltoids, biceps, triceps, wrist/hands 3+/5. RLE 2/5 prox to distal. LLE with trace to 1-2/5 KE, HF, 0/5 otherwise without change. Ongoing patchy more sciatic distribution sensory loss LLE.  RLE without obvious sensory findings. Intentional tremor bilateral UE Skin: He isnot diaphoretic. Healed grafts bilateral upper thighs. Scarring, granulation.   Low back area/buttock with scattered pockets of graft, granulation/ hypergranulation tissue with drainage, grey/brown without odor.  Psychiatric:   Pleasant and cooperative, more dynamic     Assessment/Plan: 1. Functional deficits secondary to debility, polytrauma  which require 3+ hours per day of interdisciplinary therapy in a comprehensive inpatient rehab setting.  Physiatrist is providing close team supervision and 24 hour management of active medical problems listed below.  Physiatrist and rehab team continue to assess barriers to discharge/monitor patient progress toward functional and medical goals  Care Tool:  Bathing    Body parts bathed by patient: Right arm, Left arm, Chest, Abdomen, Face   Body parts bathed by helper: Right arm Body parts n/a: Front perineal area, Buttocks, Right upper leg, Left upper leg, Right lower leg, Left lower leg(Did not complete LB this session)   Bathing assist Assist Level: Contact Guard/Touching assist     Upper Body Dressing/Undressing Upper body dressing   What is the patient wearing?: Pull over shirt    Upper body assist Assist Level: Supervision/Verbal cueing    Lower Body Dressing/Undressing Lower body dressing    Lower body dressing activity did not occur: Safety/medical concerns What is the patient wearing?: Pants     Lower body assist Assist for lower body dressing: Total Assistance - Patient < 25%     Toileting Toileting Toileting Activity did not occur Landscape architect and hygiene only): Safety/medical concerns  Toileting assist       Transfers Chair/bed transfer  Transfers assist  Chair/bed transfer activity did not occur: Safety/medical concerns  Chair/bed transfer assist level: 2 Biomedical engineer  Ambulation assist   Ambulation activity did not occur: Safety/medical concerns    Assistive device: Walker-platform Max distance: 10'   Walk 10 feet activity   Assist     Assist level: 2 helpers     Walk 50 feet activity   Assist Walk 50 feet with 2 turns activity did not occur: Safety/medical concerns          Walk 150 feet activity   Assist Walk 150 feet activity did not occur: Safety/medical concerns         Walk 10 feet on uneven surface  activity   Assist Walk 10 feet on uneven surfaces activity did not occur: Safety/medical concerns         Wheelchair     Assist Will patient use wheelchair at discharge?: Yes Type of Wheelchair: Power Wheelchair activity did not occur: Safety/medical concerns  Wheelchair assist level: Independent Max wheelchair distance: 150'    Wheelchair 50 feet with 2 turns activity    Assist        Assist Level: Independent   Wheelchair 150 feet activity     Assist Wheelchair 150 feet activity did not occur: Safety/medical concerns   Assist Level: Independent    Medical Problem List and Plan: 1.Functional and mobility deficitssecondary to major multiple trauma including numerous soft tissue injuries, pelvic fractures, and left sciatic nerve injury --Continue CIR therapies including PT, OT SLP   - therapy  3 hr/ 5 days per week  -family ed this week.   -probably could benefit from further therapy as he's made extraordinary gains, however he needs to get home emotionally. 2. Antithrombotics: -DVT/anticoagulation:Pharmaceutical:Lovenox -antiplatelet therapy: N/A 3. Pain Management:Fentanyl 50 mcg/72 hrs with dilaudid prn for breakthrough pain.  -pain seems better overall  -needs repositioning prn for comfort  -knee ROM improving!   extensive fractures appear stable, hardware in place and generally everything in alignment. No signs of HO in knee or hip.   -continue ROM exercises with PT. No CPM at this time 4. Mood:LCSW to follow for evaluation and support. Team to provide ego support. -antipsychotic agents: N/A  5. Neuropsych: This patientiscapable of making decisions on hisown behalf. 6.Multipledegloving injuries with necrosis/Skin/Wound Care:continue local care to  ostomy and tube site. Dressings in place for back/buttock areas.  -WOC RN follow up appreciated.   -  dressing changes to BID given increased output from wounds  -PA contacted plastic surgery re: follow up yesterday  -PRAFO's for bilateral heels  -I clipped all of his toenails     7. Fluids/Electrolytes/Nutrition: upgraded to regular diet!!  -continue megace for appetite, eating 100%  -will dc h2o flushes   -continue supps  - prealbumin 26.4   8. Acute on chronic respiratory failure: Decannulated--stable on 3L oxygen per . -stoma closed  -continue scheduled mucinex dm to help with cough and clearance of secretions---dc soon  - continue flutter valve, IS 9.Recent SIRSdue to enterococcus [id number]:Cefepime 6/7--6/14 and macrodantin thorough 6/13 ---both complete 10.Urethral disruption/ SPCdependent: Monthly change and flush daily and prn to prevent blockage. 11. Anemia of chronic disease:  -iron panel c/w with ACD  -improve nutritional status  -hgb 10.7 12. Hypernatremia:  Dc h2o flushes  -improving   13. Tachycardia: Continue to monitor HR tid. Lopressor increased to 50 mg tid on 6/9 14.Encephalopathy:    -off provigil and amantadine 15. Anxiety disorder:Currently on multiple medications--Zoloft 25 mg with Seroquel and Klonopin 0.5 daily in addition to prn Klonopin.  -tapered seroquel to 25mg  bid   -consider  increase in zoloft as we taper other meds.   -mood improving as meds are tapered 16.  Gastroparesis: reglan 17. Hypokalemia:    -follow up potassium 4.1, reduced kdur to 20me q bid         LOS: 15 days A FACE TO FACE EVALUATION WAS PERFORMED  Ranelle OysterZachary T Swartz 12/08/2018, 11:21 AM

## 2018-12-09 MED ORDER — QUETIAPINE FUMARATE 25 MG PO TABS
25.0000 mg | ORAL_TABLET | Freq: Every day | ORAL | Status: DC
  Administered 2018-12-10 – 2018-12-13 (×4): 25 mg via ORAL
  Filled 2018-12-09 (×4): qty 1

## 2018-12-09 MED ORDER — MUPIROCIN 2 % EX OINT
1.0000 "application " | TOPICAL_OINTMENT | Freq: Two times a day (BID) | CUTANEOUS | Status: DC
  Filled 2018-12-09: qty 22

## 2018-12-09 NOTE — Progress Notes (Signed)
Pt drsg changes completed. aligate applied to appropriate areas. Area on sacrum at bottom is open yellow and red to area noted. WOC consult placed to ensure wounds are stable or improving and not worsening.   Updated pt on PEG removal tomorrow morning and NPO after midnight.   Erie Noe, RN
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
## 2018-12-09 NOTE — Plan of Care (Signed)
  Problem: Consults Goal: RH GENERAL PATIENT EDUCATION Description: See Patient Education module for education specifics. Outcome: Progressing   Problem: RH BOWEL ELIMINATION Goal: RH STG MANAGE BOWEL WITH ASSISTANCE Description: STG Manage Bowel with max Assistance. Outcome: Progressing   Problem: RH BLADDER ELIMINATION Goal: RH STG MANAGE BLADDER WITH EQUIPMENT WITH ASSISTANCE Description: STG Manage Bladder With Equipment With  max Assistance Outcome: Progressing   Problem: RH SKIN INTEGRITY Goal: RH STG SKIN FREE OF INFECTION/BREAKDOWN Description: Manage with max assist Outcome: Progressing Goal: RH STG MAINTAIN SKIN INTEGRITY WITH ASSISTANCE Description: STG Maintain Skin Integrity With max Assistance. Outcome: Progressing Goal: RH STG ABLE TO PERFORM INCISION/WOUND CARE W/ASSISTANCE Description: STG Able To Perform Incision/Wound Care With max Assistance. Outcome: Progressing   Problem: RH SAFETY Goal: RH STG ADHERE TO SAFETY PRECAUTIONS W/ASSISTANCE/DEVICE Description: STG Adhere to Safety Precautions With supervision Assistance/Device. Outcome: Progressing   Problem: RH PAIN MANAGEMENT Goal: RH STG PAIN MANAGED AT OR BELOW PT'S PAIN GOAL Description: At or below level 4 Outcome: Progressing   Problem: RH KNOWLEDGE DEFICIT GENERAL Goal: RH STG INCREASE KNOWLEDGE OF SELF CARE AFTER HOSPITALIZATION Description: Family will be able to manage care with verbal direction of patient and using handouts/educational materials independently Outcome: Progressing

## 2018-12-10 ENCOUNTER — Encounter (HOSPITAL_COMMUNITY): Payer: Self-pay | Admitting: Psychology

## 2018-12-10 ENCOUNTER — Inpatient Hospital Stay (HOSPITAL_COMMUNITY): Payer: Self-pay | Admitting: Speech Pathology

## 2018-12-10 ENCOUNTER — Inpatient Hospital Stay (HOSPITAL_COMMUNITY): Payer: Self-pay | Admitting: Physical Therapy

## 2018-12-10 ENCOUNTER — Inpatient Hospital Stay (HOSPITAL_COMMUNITY): Payer: Self-pay | Admitting: Occupational Therapy

## 2018-12-10 DIAGNOSIS — Z431 Encounter for attention to gastrostomy: Secondary | ICD-10-CM

## 2018-12-10 DIAGNOSIS — R52 Pain, unspecified: Secondary | ICD-10-CM

## 2018-12-10 LAB — CBC
HCT: 38.3 % — ABNORMAL LOW (ref 39.0–52.0)
Hemoglobin: 11.9 g/dL — ABNORMAL LOW (ref 13.0–17.0)
MCH: 33 pg (ref 26.0–34.0)
MCHC: 31.1 g/dL (ref 30.0–36.0)
MCV: 106.1 fL — ABNORMAL HIGH (ref 80.0–100.0)
Platelets: 434 10*3/uL — ABNORMAL HIGH (ref 150–400)
RBC: 3.61 MIL/uL — ABNORMAL LOW (ref 4.22–5.81)
RDW: 14.5 % (ref 11.5–15.5)
WBC: 9.1 10*3/uL (ref 4.0–10.5)
nRBC: 0 % (ref 0.0–0.2)

## 2018-12-10 LAB — BASIC METABOLIC PANEL
Anion gap: 11 (ref 5–15)
BUN: 25 mg/dL — ABNORMAL HIGH (ref 6–20)
CO2: 22 mmol/L (ref 22–32)
Calcium: 10.8 mg/dL — ABNORMAL HIGH (ref 8.9–10.3)
Chloride: 104 mmol/L (ref 98–111)
Creatinine, Ser: 0.39 mg/dL — ABNORMAL LOW (ref 0.61–1.24)
GFR calc Af Amer: >60 mL/min (ref >60)
GFR calc non Af Amer: >60 mL/min (ref >60)
Glucose, Bld: 94 mg/dL (ref 70–99)
Potassium: 4 mmol/L (ref 3.5–5.1)
Sodium: 137 mmol/L (ref 135–145)

## 2018-12-10 LAB — PREALBUMIN: Prealbumin: 28 mg/dL (ref 18–38)

## 2018-12-10 MED ORDER — CLONAZEPAM 0.5 MG PO TABS
0.2500 mg | ORAL_TABLET | Freq: Every day | ORAL | Status: DC
  Administered 2018-12-11 – 2018-12-12 (×2): 0.25 mg via ORAL
  Filled 2018-12-10 (×2): qty 1

## 2018-12-10 MED ORDER — CLONAZEPAM 0.5 MG PO TABS
0.2500 mg | ORAL_TABLET | Freq: Three times a day (TID) | ORAL | Status: DC | PRN
  Administered 2018-12-13: 0.25 mg via ORAL
  Filled 2018-12-10 (×2): qty 1

## 2018-12-10 MED ORDER — SERTRALINE HCL 50 MG PO TABS
25.0000 mg | ORAL_TABLET | Freq: Every day | ORAL | Status: DC
  Administered 2018-12-11 – 2018-12-14 (×4): 25 mg via ORAL
  Filled 2018-12-10 (×4): qty 1

## 2018-12-10 MED ORDER — GERHARDT'S BUTT CREAM
TOPICAL_CREAM | Freq: Two times a day (BID) | CUTANEOUS | Status: DC
  Administered 2018-12-10 – 2018-12-14 (×8): via TOPICAL
  Filled 2018-12-10 (×2): qty 1
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
## 2018-12-10 NOTE — Progress Notes (Signed)
Occupational Therapy Session Note  Patient Details  Name: Christopher [name] MRN: [id number] Date of Birth: Jun 08, 1966  Today's Date: 12/10/2018 OT Individual Time: 1300-1340 OT Individual Time Calculation (min): 40 min  and Today's Date: 12/10/2018 OT Missed Time: 20 Minutes Missed Time Reason: Pain   Short Term Goals: Week 3:  OT Short Term Goal 1 (Week 3): STG=LTG due to LOS  Skilled Therapeutic Interventions/Progress Updates:    Therapist arrived at 11:00 for scheduled OT session. Pt in supine, complaints of severe pain and nausea following previous sessions. Requesting snack from therapist and then  to cont to rest. Pt proivded with magic cup and set-up to eat snack, all needs in reach. RN already delivered all available pain medications.  Therapist returned at 1:00, pt willing to attempt therapy as able. Reports pain subsiding, however, had not had lunch yet. He transferred to sitting EOB with max A +1. Complaints of severe dizzy/nausea upon sitting and request to lay back down. BP in supine 128/86. Following rest, pt willing to attempt again. BP assessed in sitting, WFL and no orthostatic dropping of BP with change in position. Using PFRW, pt ambulated ~72ft to power w/c, min-mod A overall with VCs for technique. Pt declined UB bathing/dressing as awaiting wife to bring new clothes. He managed power w/c throughout unit mod I. Ambulated ~92ft to therapy mat in same manner as described above. Following short seated rest break he returned to power w/c. He requires mod A to power into standing, however, is min A once in standing. He requires significantly increased time and extended rest break following all standing/transfers during session. He returned to room in power w/c. He was set-up with lunch tray and hand off to next therapist.   Therapy Documentation Precautions:  Precautions Precautions: Fall, Other (comment) Precaution Comments: PEG, ostomy, SP catheter, multiple healing skin  grafts/wounds periarea and B LEs Restrictions Weight Bearing Restrictions: No RLE Weight Bearing: Weight bearing as tolerated LLE Weight Bearing: Weight bearing as tolerated   Therapy/Group: Individual Therapy  Burman Bruington L 12/10/2018, 1:43 PM
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
## 2018-12-10 NOTE — Progress Notes (Signed)
Physical Therapy Weekly Progress Note  Patient Details  Name: Christopher [name] MRN: [id number] Date of Birth: [birth year]  Beginning of progress report period: December 02, 2018 End of progress report period: December 10, 2018  Today's Date: 12/10/2018 PT Individual Time: 0900-0945 PT Individual Time Calculation (min): 45 min  PT Amount of Missed Time (min): 15 Minutes PT Missed Treatment Reason: Patient ill (Comment);Pain(nausea)  Patient has met 2 of 3 short term goals.  Pt is able to complete rolling with CGA to min A with use of bedrails, supine to/from sit with mod to max A, sit to stand with mod to max A to PFRW, and is now able to complete stand pivot transfers with use of PFRW and assist x 2. Pt has also been able to initiate gait training up to 21' with PFRW and assist x 2. Pt is extremely motivated to work hard during therapy sessions and continues to make good progress towards d/c home. Pt is limited by ongoing pain in low back and L hip that limits sitting tolerance and tolerance for therapy sessions.  Patient continues to demonstrate the following deficits muscle weakness and muscle joint tightness, decreased cardiorespiratoy endurance and decreased sitting balance, decreased standing balance, decreased postural control and decreased balance strategies and therefore will continue to benefit from skilled PT intervention to increase functional independence with mobility.  Patient progressing toward long term goals..  Continue plan of care.  PT Short Term Goals Week 2:  PT Short Term Goal 1 (Week 2): Pt will complete least restrictive transfer with max A x 1 PT Short Term Goal 1 - Progress (Week 2): Progressing toward goal PT Short Term Goal 2 (Week 2): Pt will initiate w/c mobility PT Short Term Goal 2 - Progress (Week 2): Met PT Short Term Goal 3 (Week 2): Pt will tolerate sitting OOB in w/c x 1 hour PT Short Term Goal 3 - Progress (Week 2): Met Week 3:  PT Short Term Goal 1 (Week 3):  =LTG due to ELOS  Skilled Therapeutic Interventions/Progress Updates:  Pt received supine in bed, agreeable to PT session. Pt reports 9/10 pain in low back at rest, able to receive pain medication from RN during therapy session. Supine to sit with mod A. Sit to stand with mod A to [place]. Ambulation x 5' with PFRW and min A x 1 with assist from a second person for steering RW to assist pt safely to power w/c. Power w/c mobility x 150 ft independently. Ambulation x 12' with PFRW and min A with close w/c follow due to decreased endurance. Pt exhibits significant pain in back throughout session limiting his ability to participate. Pt also has onset of nausea and requests to return to his room. Ambulation x 5' with PFRW and min A from w/c to bed. Sit to supine mod A for BLE management. RN aware of pt's symptoms and able to provide ginger ale for nausea. Pt left sidelying in bed with needs in reach at end of session. Pt missed 15 min of scheduled therapy session 2/2 pain and nausea.  Therapy Documentation Precautions:  Precautions Precautions: Fall, Other (comment) Precaution Comments: PEG, ostomy, SP catheter, multiple healing skin grafts/wounds periarea and B LEs Restrictions Weight Bearing Restrictions: No RLE Weight Bearing: Weight bearing as tolerated LLE Weight Bearing: Weight bearing as tolerated General: PT Amount of Missed Time (min): 15 Minutes PT Missed Treatment Reason: Patient ill (Comment);Pain(nausea)   Therapy/Group: Individual Therapy   [name], PT, DPT  12/10/2018,  9:51 AM

## 2018-12-10 NOTE — Progress Notes (Signed)
Occupational Therapy Session Note  Patient Details  Name: Christopher [name] MRN: [id number] Date of Birth: May 04, 1966  Today's Date: 12/10/2018 OT Individual Time: 1340-1355 OT Individual Time Calculation (min): 15 min  and Today's Date: 12/10/2018 OT Missed Time: 30 Minutes Missed Time Reason: Pain  Short Term Goals: Week 3:  OT Short Term Goal 1 (Week 3): STG=LTG due to LOS  Skilled Therapeutic Interventions/Progress Updates:    Pt greeted handoff form previous OT session. Pt needed set-up A to open containers for lunch. Pt took a few bites of his burger, then began sweating and crying out in pain. Pt reports pain and nausea, but did not throw up. Pt requests to return to bed. Pt able to maneuver power chair to position it by EOB and pt came into standing with PFRW and min A. Min A to pivot over to bed. Pt needed assistance to then lift BLEs into bed. Pt assisted with bed position on his side for pressure relief. Pt continued to report pain and nausea. Nursing notified for nausea meds. Bed alarm on and call bell in reach.   Therapy Documentation Precautions:  Precautions Precautions: Fall, Other (comment) Precaution Comments: PEG, ostomy, SP catheter, multiple healing skin grafts/wounds periarea and B LEs Restrictions Weight Bearing Restrictions: No RLE Weight Bearing: Weight bearing as tolerated LLE Weight Bearing: Weight bearing as tolerated General: General OT Amount of Missed Time: 30 Minutes Pain: Pain Assessment Pain Scale: 0-10 Pain Score: 9  Pain Location: Back Pain Orientation: Lower Pain Descriptors / Indicators: Aching Pain Onset: On-going Pain Intervention(s): Repositioned   Therapy/Group: Individual Therapy  Valma Cava 12/10/2018, 2:04 PM

## 2018-12-10 NOTE — Progress Notes (Addendum)
[facility] PHYSICAL MEDICINE & REHABILITATION PROGRESS NOTE   Subjective/Complaints: Nervous about PEG coming out. Otherwise doing well.   ROS: Patient denies fever, rash, sore throat, blurred vision, nausea, vomiting, diarrhea, cough, shortness of breath or chest pain,   headache, or mood change.     Objective:   No results found. Recent Labs    12/10/18 0731  WBC 9.1  HGB 11.9*  HCT 38.3*  PLT 434*   Recent Labs    12/10/18 0731  NA 137  K 4.0  CL 104  CO2 22  GLUCOSE 94  BUN 25*  CREATININE 0.39*  CALCIUM 10.8*    Intake/Output Summary (Last 24 hours) at 12/10/2018 1056 Last data filed at 12/10/2018 0415 Gross per 24 hour  Intake 240 ml  Output 3350 ml  Net -3110 ml     Physical Exam: Vital Signs Blood pressure 117/82, pulse 73, temperature (!) 97.3 F (36.3 C), temperature source Axillary, resp. rate 16, height 6\' 2"  (1.88 m), weight 66.2 kg, SpO2 100 %. Constitutional: No distress . Vital signs reviewed. HEENT: EOMI, oral membranes moist Neck: supple Cardiovascular: RRR without murmur. No JVD    Respiratory: CTA Bilaterally without wheezes or rales. Normal effort    GI: BS +, non-tender, non-distended , PEG, ostomy intact Genitourinary:   SPC intact Musculoskeletal:  Comments: heel cords and left>right knee tight--able to bend knee to about 30-40 degrees now.  LB tender.  . Left hip limited with PROM as well.  Neurological: He is alert and o x 3.  UE 3- to 3/5 deltoids, biceps, triceps, wrist/hands 3+/5. RLE 2/5 prox to distal. LLE with trace to 1-2/5 KE, HF, 0/5 otherwise without change. Ongoing patchy more sciatic distribution sensory loss LLE.  RLE without obvious sensory findings. Intentional tremor bilateral UE still present Skin: He isnot diaphoretic. Healed grafts bilateral upper thighs. Scarring, granulation.   Low back area/buttock with scattered pockets of graft, granulation/ hypergranulation tissue with grey drainage, skin in groin/scrotal  area irritated.  Psychiatric:  pleasant     Assessment/Plan: 1. Functional deficits secondary to debility, polytrauma  which require 3+ hours per day of interdisciplinary therapy in a comprehensive inpatient rehab setting.  Physiatrist is providing close team supervision and 24 hour management of active medical problems listed below.  Physiatrist and rehab team continue to assess barriers to discharge/monitor patient progress toward functional and medical goals  Care Tool:  Bathing    Body parts bathed by patient: Right arm, Left arm, Chest, Abdomen, Face   Body parts bathed by helper: Right arm Body parts n/a: Front perineal area, Buttocks, Right upper leg, Left upper leg, Right lower leg, Left lower leg(Did not complete LB this session)   Bathing assist Assist Level: Contact Guard/Touching assist     Upper Body Dressing/Undressing Upper body dressing   What is the patient wearing?: Pull over shirt    Upper body assist Assist Level: Supervision/Verbal cueing    Lower Body Dressing/Undressing Lower body dressing    Lower body dressing activity did not occur: Safety/medical concerns What is the patient wearing?: Pants     Lower body assist Assist for lower body dressing: Total Assistance - Patient < 25%     Toileting Toileting Toileting Activity did not occur Press photographer(Clothing management and hygiene only): Safety/medical concerns  Toileting assist       Transfers Chair/bed transfer  Transfers assist  Chair/bed transfer activity did not occur: Safety/medical concerns  Chair/bed transfer assist level: 2 Helpers  Locomotion Ambulation   Ambulation assist   Ambulation activity did not occur: Safety/medical concerns  Assist level: 2 helpers Assistive device: Walker-platform Max distance: 12'   Walk 10 feet activity   Assist     Assist level: 2 helpers Assistive device: Walker-platform   Walk 50 feet activity   Assist Walk 50 feet with 2 turns  activity did not occur: Safety/medical concerns         Walk 150 feet activity   Assist Walk 150 feet activity did not occur: Safety/medical concerns         Walk 10 feet on uneven surface  activity   Assist Walk 10 feet on uneven surfaces activity did not occur: Safety/medical concerns         Wheelchair     Assist Will patient use wheelchair at discharge?: Yes Type of Wheelchair: Power Wheelchair activity did not occur: Safety/medical concerns  Wheelchair assist level: Independent Max wheelchair distance: 150'    Wheelchair 50 feet with 2 turns activity    Assist        Assist Level: Independent   Wheelchair 150 feet activity     Assist Wheelchair 150 feet activity did not occur: Safety/medical concerns   Assist Level: Independent    Medical Problem List and Plan: 1.Functional and mobility deficitssecondary to major multiple trauma including numerous soft tissue injuries, pelvic fractures, and left sciatic nerve injury --Continue CIR therapies including PT, OT SLP   - therapy  3 hr/ 5 days per week  -family ed this week.    2. Antithrombotics: -DVT/anticoagulation:Pharmaceutical:Lovenox -antiplatelet therapy: N/A 3. Pain Management:Fentanyl 50 mcg/72 hrs with dilaudid prn for breakthrough pain.  -pain seems better overall  -consider decreasing fentanyl patch at next change  -knee ROM improving!   extensive fractures appear stable, hardware in place and generally everything in alignment. No signs of HO in knee or hip.   -continue ROM exercises with PT. No CPM at this time 4. Mood:LCSW to follow for evaluation and support. Team to provide ego support. -antipsychotic agents: N/A  5. Neuropsych: This patientiscapable of making decisions on hisown behalf. 6.Multipledegloving injuries with necrosis/Skin/Wound Care:continue local care to ostomy and tube site. Dressings in place for  back/buttock areas.  -WOC RN follow up appreciated.   -  dressing changes to BID given increased output from wounds  -barrier cream to groin/scrotum/buttocks  -contacting plastic surgery re: follow up before he goes home, ongoing drainage from graft/wound site on buttock/low back -PRAFO's for bilateral heels      7. Fluids/Electrolytes/Nutrition: upgraded to regular diet!!  -continue megace for appetite, eating 75- 100%  -PEG removed today with traction, pressure dressing applied.  -continue supplements  - prealbumin up to 28 (continues to trend up) 8. Acute on chronic respiratory failure: Decannulated. -stoma closed  -dc mucinex  - continue flutter valve, IS 9.Recent SIRSdue to enterococcus QVZ:DGLOVFIE 6/7--6/14 and macrodantin thorough 6/13 ---both completed 10.Urethral disruption/ SPCdependent: Monthly change and flush daily and prn to prevent blockage. 11. Anemia of chronic disease:  -iron panel c/w with ACD  -improve nutritional status  -hgb 10.7 12. Hypernatremia:  -improving   13. Tachycardia: Continue to monitor HR tid. Lopressor increased to 50 mg tid on 6/9 14.Encephalopathy:    -off provigil and amantadine 15. Anxiety disorder:Currently on multiple medications--  -tapered seroquel to 25mg  qhs  -begin zoloft 25mg  daily (he hasn't been on yet)    -decrease klonopin to 0.25mg  daily  -mood improved  -anxious to get home 16.  Gastroparesis: reglan, weaning 17. Hypokalemia:    -follow up potassium 4.1, reduced kdur to 20me q bid         LOS: 17 days A FACE TO FACE EVALUATION WAS PERFORMED  Ranelle OysterZachary T Ayanni Tun 12/10/2018, 10:56 AM

## 2018-12-10 NOTE — Progress Notes (Addendum)
Speech Language Pathology Daily Session Note  Patient Details  Name: Christopher [name] MRN: [id number] Date of Birth: 03/31/1966  Today's Date: 12/10/2018 SLP Individual Time: 1011-1049 SLP Individual Time Calculation (min): 38 min  Short Term Goals: Week 3: SLP Short Term Goal 1 (Week 3): Pt will consume trials of regular diet textures without self-reported increase in fatigue for 3 sessions prior to diet upgrade. SLP Short Term Goal 2 (Week 3): Given hypothetical situations within home and community environment, pt will provide appropriately safe solution in 8 out of 10 opportunities with supervision questions. SLP Short Term Goal 3 (Week 3): Pt will demonstrate selective attention for ~ 45 minutes in moderately distracting environment (to include physical distractions) with supervision cues.  Skilled Therapeutic Interventions:   Skilled treatment session focused on cognition goals. SLP received pt in bed, reporting pain and nausea from combination of having PEG tube pulled and not eating. Pt able to direct care, implement strategies for pain/anxiety management. He was also able to problem solve repositioning himself and recalled recommendations from PT/OT. Pt able to demonstrate selective attention despite internal distractions. Pt offered saltines to aid in nausea. Pt left in bed with all needs within reach. Continue per current plan of care.   Pain    Therapy/Group: Individual Therapy  Raushanah Osmundson 12/10/2018, 10:58 AM

## 2018-12-11 ENCOUNTER — Inpatient Hospital Stay (HOSPITAL_COMMUNITY): Payer: Self-pay | Admitting: Speech Pathology

## 2018-12-11 ENCOUNTER — Inpatient Hospital Stay (HOSPITAL_COMMUNITY): Payer: Self-pay | Admitting: Occupational Therapy

## 2018-12-11 MED ORDER — PRO-STAT SUGAR FREE PO LIQD
30.0000 mL | Freq: Three times a day (TID) | ORAL | Status: DC
  Administered 2018-12-11 – 2018-12-14 (×9): 30 mL via ORAL
  Filled 2018-12-11 (×9): qty 30

## 2018-12-11 MED ORDER — POTASSIUM CHLORIDE CRYS ER 20 MEQ PO TBCR
20.0000 meq | EXTENDED_RELEASE_TABLET | Freq: Every day | ORAL | Status: DC
  Administered 2018-12-12 – 2018-12-14 (×3): 20 meq via ORAL
  Filled 2018-12-11 (×3): qty 1

## 2018-12-11 NOTE — Progress Notes (Signed)
Nutrition Follow-up  DOCUMENTATION CODES:   Severe malnutrition in context of chronic illness  INTERVENTION:   - Pro-stat 30 ml po TID, each supplement provides 100 kcal and 15 grams of protein  - Continue MVI with minerals daily  - Continue Magic Cup TID with meals, each supplement provides 290 kcal and 9 grams of protein  - Continue snacks TID between meals  - d/c Juven as pt is refusing  NUTRITION DIAGNOSIS:   Severe Malnutrition related to chronic illness (trauma, wounds, multiple fractures, dysphagia) as evidenced by severe muscle depletion, severe fat depletion, percent weight loss (16.3% weight loss in 3 months).  Ongoing, being addressed via snacks and oral nutrition supplements  GOAL:   Patient will meet greater than or equal to 90% of their needs  Progressing  MONITOR:   Labs, I & O's, Weight trends, TF tolerance, Skin, Diet advancement  REASON FOR ASSESSMENT:   Consult Enteral/tube feeding initiation and management  ASSESSMENT:   53 year old male who presented to the ED on 07/10/18 as a level 1 trauma after he was accidentally backed over with a tractor trailer at work. Obvious open wound to left groin and scrotum. Taken to OR for hemorrhage control. Urology consulted for urethral trauma and scrotal degloving. Pt developed abdominal compartment syndrome in OR and underwent decompressive laparotomy. IR performed pelvic arteriogram and embolization of right internal iliac and SP tube placement. Hospital course with ortho consulted recommended operative fixation of pelvis when stabilized and placed in traction 1/29. Plastics consulted 1/29 for complex degloving of left groin/thigh and scrotal degloving. To OR with trauma and orthopedic surgery 1/30 for abdominal washout and pelvic fixation. Pt underwent multiple additional OR procedures during hospitalization. On 2/10 pt had creation of diverting colostomy. On 2/20 pt had tracheostomy. On 3/3 pt had PEG placed. Pt  discharged to [facility] on 10/16/18 on vent via trach. Eventually weaned from vent and began capping trials on 5/28 with eventual decannulation on 6/5. Pt failed FEES on 6/1 and remains NPO. Admitted to CIR on 6/12.  6/15 - MBS with recommendations for Regular diet 6/16 - diet downgraded to Dysphagia 2 for energy conservation 6/23 - nocturnal TF d/c 6/29 - PEG removed  Target d/c date is 7/03.  Overall, weight down 3.75 lbs. Unsure of accuracy of some of pt's weights given large variability. Will continue to monitor trends.  Spoke with pt at bedside. Pt requesting assistance with lunch set-up. RD assisted pt in removing containers from meal tray items and provided pt with a cup of ice for his beverage. Pt requesting assistance with repositioning due to pain in lower back. RD alerted RN.  Pt states that his appetite is great today. Pt reports that yesterday his appetite was poor due to pain with PEG removal. RD encouraged adequate PO intake with a focus on protein-rich foods.  Meal Completion: 75-100%, 2 meals skipped yesterday  Medications reviewed and include: Pro-stat 60 ml TID, Megace, MVI with minerals, Juven BID (pt refusing), Protonix, Miralax, K-dur 20 mEq daily, Senna  Labs reviewed.  UOP: 1675 ml x 24 hours Colostomy: 300 ml x 24 hours I/O's: -51.0 L since admit  Diet Order:   Diet Order            Diet regular Room service appropriate? Yes; Fluid consistency: Thin  Diet effective now              EDUCATION NEEDS:   No education needs have been identified at this time  Skin:  Skin  Assessment: Skin Integrity Issues: Other: chronic wounds ot back and sacrum/buttock area; s/p A cell grafts to scrotum, perineal skin, and buttocks wounds  Last BM:  12/11/18 colostomy  Height:   Ht Readings from Last 1 Encounters:  11/28/18 6\' 2"  (1.88 m)    Weight:   Wt Readings from Last 1 Encounters:  12/11/18 69.4 kg    Ideal Body Weight:  86.4 kg  BMI:  Body mass index is  19.64 kg/m.  Estimated Nutritional Needs:   Kcal:  2641-5830  Protein:  145-170 grams  Fluid:  >2.0 L    Gaynell Face, MS, RD, LDN Inpatient Clinical Dietitian Pager: [phone] Weekend/After Hours: [phone]

## 2018-12-11 NOTE — Progress Notes (Signed)
Patient alert and oriented; meds as ordered this shift; prn pain medications given as requested; Suprapubic catheter and colostomy intact. Wound treatment as ordered; Will continue to monitor

## 2018-12-11 NOTE — Progress Notes (Signed)
Speech Language Pathology Daily Session Note  Patient Details  Name: Christopher [name] MRN: [id number] Date of Birth: [birth year]  Today's Date: 12/11/2018 SLP Individual Time: 0800-0830 SLP Individual Time Calculation (min): 30 min  Short Term Goals: Week 3: SLP Short Term Goal 1 (Week 3): Pt will consume trials of regular diet textures without self-reported increase in fatigue for 3 sessions prior to diet upgrade. SLP Short Term Goal 2 (Week 3): Given hypothetical situations within home and community environment, pt will provide appropriately safe solution in 8 out of 10 opportunities with supervision questions. SLP Short Term Goal 3 (Week 3): Pt will demonstrate selective attention for ~ 45 minutes in moderately distracting environment (to include physical distractions) with supervision cues.  Skilled Therapeutic Interventions: Skilled treatment session focused on cognition goals. SLP faciltiated session by providing question cues regarding plans for physical transfers. Pt able to sequence and describe each step with clarity and accuracy. Pt left in bed with family education scheduled on 7/1 then pt will be discharged from skilled ST no follow up services needed.       Pain    Therapy/Group: Individual Therapy  Trenell Concannon 12/11/2018, 2:07 PM

## 2018-12-11 NOTE — Patient Care Conference (Signed)
Inpatient RehabilitationTeam Conference and Plan of Care Update Date: 12/11/2018   Time: 2:00 PM    Patient Name: Christopher [name]      Medical Record Number: [id number]  Date of Birth: September 18, 1965 Sex: Male         Room/Bed: 4W19C/4W19C-01 Payor Info: Payor: [employer] / Plan: GALLAGHER / Product Type: *No Product type* /    Admitting Diagnosis: 1. SCI Team Other multi trauma; 19-23 days  Admit Date/Time:  11/23/2018  1:18 PM Admission Comments: No comment available   Primary Diagnosis:  <principal problem not specified> Principal Problem: <principal problem not specified>  Patient Active Problem List   Diagnosis Date Noted  . Pain   . Debility 11/23/2018  . Acute on chronic respiratory failure with hypoxia ([place])   . Bacteremia due to Pseudomonas   . Chronic pain syndrome   . Multiple traumatic injuries   . Pressure injury of skin 08/13/2018  . Acute hemorrhage 07/13/2018  . Degloving injury of lower leg, initial encounter 07/13/2018  . Urethral injury 07/13/2018  . Scrotal injury, initial encounter 07/13/2018  . Fracture of femoral neck, left ([place]) 07/10/2018  . Multiple fractures of pelvis with unstable disruption of pelvic ring, initial encounter for open fracture ([place]) 07/10/2018    Expected Discharge Date: Expected Discharge Date: 12/14/18  Team Members Present: Physician leading conference: Dr. Alger Simons Social Worker Present: Alfonse Alpers, LCSW Nurse Present: Starlyn Skeans, RN PT Present: [name], PT OT Present: Amy Rounds, OT SLP Present: Stormy Fabian, SLP PPS Coordinator present : Gunnar Fusi, SLP     Current Status/Progress Goal Weekly Team Focus  Medical   peg out. wound care ongoing. pain an issue. appetite and intake good.  finalize dc planning  family ed, wound care, nutrition, pain   Bowel/Bladder   Suprapubic catheter and ostomy; Dependent for care  Maintain Skin Integrity and prevent Infection;  provide care independently  of with family assitance  Assess Suprapubic and Ostomy q shift and prn and provide care   Swallow/Nutrition/ Hydration   regular with thin liquids  Mod I - goal met  regular trials   ADL's   Supervision UB bathing/dressing; total A LB bathing/dressing; mod A stand pivot transfers using PFRW  Supervision grooming and UB bathing/dressing, will re-initiate standing balance and sit>stand goals for min A  Functional activity tolerance, functional transfers, UE/LE strengthening/ROM, family training, d/c planning   Mobility   min A rolling, mod to max A supine to/from sit, mod A to stand to RW and SPT with PFRW, gait up to 10' with PFRW with assist x 2 for safety, indep power w/c  min A overall  transfers, standing, gait training as able, family training this week   Communication   Mod I for conversation level  Mod I - goal met  completion of intelligibility strategies   Safety/Cognition/ Behavioral Observations  Supervision for basic to Mod I at baseline  Supervision A to Mod I - be considered at baseline  basic problem solving, awareness and selective attention   Pain   Patient c/o of mid lower back and generalized body pain;  rates pain 9 out 10;  prn pain q 4 hrs prn  Pain < or = 3  Assess pain q shift and prn and provide medication and relaxation techniques   Skin   MASD to groin, abrasion to legs and buttocks, skin graft to left thigh and buttocks, suprapubic, scar from peg tube removal 12/10/2018  Maintain skin integrity and  remain free of infection  Assess and monitor skin q shift and provide wound care as ordered    Rehab Goals Patient on target to meet rehab goals: Yes Rehab Goals Revised: none *See Care Plan and progress notes for long and short-term goals.     Barriers to Discharge  Current Status/Progress Possible Resolutions Date Resolved   Physician    Medical stability;Wound Care               Nursing                  PT  Wound Care;Other (comments)  pain              OT                   SLP                SW                Discharge Planning/Teaching Needs:  Pt to return home where pt's fiance and other family members will assist.  They will need the additional assistance of caregivers through Workers Comp, especially for 8 hours/day Monday - Friday while fiance is working.  Family education to occur this week, as he will need minimal to maximal assistance at home.   Team Discussion:  Pt's PEG is out and area is already closing up and pt is eating well.  Pt still has pain, but is making nice progress.  Pt with nausea today, medication helped.  RN to change colostomy bag and dressings today, pt does not think he will be able to do his bag himself.  Pt does better OT when not nauseated or fatigued.  Pt has started to do txs with platform walker, but family will still need to learn hoyer lift.  Pt is doing stand pivot txs at min/mod A.  He walked 46' with min/mod A and making good progress.  ST to sign off on pt tomorrow after family education.  Revisions to Treatment Plan:  none    Continued Need for Acute Rehabilitation Level of Care: The patient requires daily medical management by a physician with specialized training in physical medicine and rehabilitation for the following conditions: Daily direction of a multidisciplinary physical rehabilitation program to ensure safe treatment while eliciting the highest outcome that is of practical value to the patient.: Yes Daily medical management of patient stability for increased activity during participation in an intensive rehabilitation regime.: Yes Daily analysis of laboratory values and/or radiology reports with any subsequent need for medication adjustment of medical intervention for : Post surgical problems;Wound care problems;Nutritional problems;Other   I attest that I was present, lead the team conference, and concur with the assessment and plan of the team.Team conference was held via web/ teleconference  due to [place] - 19.   Jameison Haji, Silvestre Mesi 12/11/2018, 11:09 PM

## 2018-12-11 NOTE — Progress Notes (Addendum)
Physical Therapy Session Note  Patient Details  Name: Christopher [name] MRN: [id number] Date of Birth: 08/30/1965  Today's Date: 12/11/2018 PT Individual Time: 0915-1000 PT Individual Time Calculation (min): 45 min  Addendum: PT Missed Time: 15 min Missed Time Reason: nausea  Short Term Goals: Week 3:  PT Short Term Goal 1 (Week 3): =LTG due to ELOS  Skilled Therapeutic Interventions/Progress Updates:    Pt received supine in bed, agreeable to PT session. No complaints of pain, pt reports being premedicated prior to start of therapy session. Supine to sit with mod A. Sit to stand with mod A to PFRW from elevated surface. Stand pivot transfer bed to w/c with PFRW and mod A, posterior bias in standing. Pt demos better eccentric control when sitting down in w/c. Power w/c mobility mod I. Education with patient about equipment he will be using at home with family upon d/c as well as mobility options with his power chair. Sit to stand to [place] to attempt gait with mod A, pt reports onset of nausea and requests to sit down. RN able to provide anti-nausea medication and provided cool cloth for patient's forehead. Pt requests to return to bed 2/2 nausea. Stand pivot transfer w/c to bed with mod A and PFRW. Sit to supine mod A for BLE management. Rolling to the R with min A and use of bedrail. Pt left sidelying in bed with needs in reach at end of session. Pt missed 15 min of scheduled therapy session 2/2 nausea.  Therapy Documentation Precautions:  Precautions Precautions: Fall, Other (comment) Precaution Comments: PEG, ostomy, SP catheter, multiple healing skin grafts/wounds periarea and B LEs Restrictions Weight Bearing Restrictions: No RLE Weight Bearing: Weight bearing as tolerated LLE Weight Bearing: Weight bearing as tolerated    Therapy/Group: Individual Therapy   [name], PT, DPT  12/11/2018, 3:43 PM

## 2018-12-11 NOTE — Progress Notes (Signed)
Occupational Therapy Note  Patient Details  Name: Christopher [name] MRN: [id number] Date of Birth: 07/17/1965  Today's Date: 12/11/2018 OT Missed Time: 35 Minutes Missed Time Reason: Pain;Patient fatigue  Pt reported nausea today and requested to continue to rest in bed.  Missed 60 min of OT.    Willeen [facility] 12/11/2018, 3:22 PM

## 2018-12-11 NOTE — Progress Notes (Signed)
Social Work Patient ID: Christopher [name], male   DOB: Feb 27, 1966, 53 y.o.   MRN: [id number]   CSW met with pt and spoke with pt's fiance, [name], via telephone to update them on team conference discussion, family education, and discharge planning.  [name] is ready for pt to come home and looking forward to reuniting with pt tomorrow for family education.  Workers Comp still trying to arrange [facility] and private duty assistance.  CSW tried to help them with agency options.  DME to be arranged by Workers Comp, as well.  CSW has given all orders to case manager and they are taking over from there.  Pt will also need ambulance transportation home on Friday.  CSW will continue to follow and assist as needed.

## 2018-12-11 NOTE — Progress Notes (Signed)
[facility] PHYSICAL MEDICINE & REHABILITATION PROGRESS NOTE   Subjective/Complaints: Slept well. No problems since PEG out. Pain under fair control.   ROS: Patient denies fever, rash, sore throat, blurred vision, nausea, vomiting, diarrhea, cough, shortness of breath or chest pain,   headache, or mood change. .     Objective:   No results found. Recent Labs    12/10/18 0731  WBC 9.1  HGB 11.9*  HCT 38.3*  PLT 434*   Recent Labs    12/10/18 0731  NA 137  K 4.0  CL 104  CO2 22  GLUCOSE 94  BUN 25*  CREATININE 0.39*  CALCIUM 10.8*    Intake/Output Summary (Last 24 hours) at 12/11/2018 1305 Last data filed at 12/11/2018 0954 Gross per 24 hour  Intake 240 ml  Output 1975 ml  Net -1735 ml     Physical Exam: Vital Signs Blood pressure 120/73, pulse 82, temperature 97.6 F (36.4 C), resp. rate 16, height 6\' 2"  (1.88 m), weight 69.4 kg, SpO2 98 %. Constitutional: No distress . Vital signs reviewed. HEENT: EOMI, oral membranes moist Neck: supple Cardiovascular: RRR without murmur. No JVD    Respiratory: CTA Bilaterally without wheezes or rales. Normal effort    GI: BS +, non-tender, non-distended, ostomy sealed, PEG site already closed Genitourinary:   SPC intact with straw colored urine Musculoskeletal:  Comments: left knee rom 30-40 degrees  Neurological: He is alert and o x 3.  UE 3- to 3/5 deltoids, biceps, triceps, wrist/hands 3+/5. RLE 2/5 prox to distal. LLE with trace to 1-2/5 KE, HF, 0/5 otherwise without change. Ongoing patchy mostly sciatic distribution sensory loss LLE.  RLE without obvious sensory findings. Intentional tremor bilateral UE still present Skin: He isnot diaphoretic. Healed grafts bilateral upper thighs. Scarring, granulation.   Low back area/buttock with scattered pockets of graft, granulation/i did not visualize today Psychiatric:  Pleasant, sl anxious     Assessment/Plan: 1. Functional deficits secondary to debility, polytrauma   which require 3+ hours per day of interdisciplinary therapy in a comprehensive inpatient rehab setting.  Physiatrist is providing close team supervision and 24 hour management of active medical problems listed below.  Physiatrist and rehab team continue to assess barriers to discharge/monitor patient progress toward functional and medical goals  Care Tool:  Bathing    Body parts bathed by patient: Right arm, Left arm, Chest, Abdomen, Face   Body parts bathed by helper: Right arm Body parts n/a: Front perineal area, Buttocks, Right upper leg, Left upper leg, Right lower leg, Left lower leg(Did not complete LB this session)   Bathing assist Assist Level: Contact Guard/Touching assist     Upper Body Dressing/Undressing Upper body dressing   What is the patient wearing?: Pull over shirt    Upper body assist Assist Level: Supervision/Verbal cueing    Lower Body Dressing/Undressing Lower body dressing    Lower body dressing activity did not occur: Safety/medical concerns What is the patient wearing?: Pants     Lower body assist Assist for lower body dressing: Total Assistance - Patient < 25%     Toileting Toileting Toileting Activity did not occur Landscape architect and hygiene only): Safety/medical concerns  Toileting assist       Transfers Chair/bed transfer  Transfers assist  Chair/bed transfer activity did not occur: Safety/medical concerns  Chair/bed transfer assist level: 2 Helpers     Locomotion Ambulation   Ambulation assist   Ambulation activity did not occur: Safety/medical concerns  Assist level: 2 helpers  Assistive device: Walker-platform Max distance: 12'   Walk 10 feet activity   Assist     Assist level: 2 helpers Assistive device: Walker-platform   Walk 50 feet activity   Assist Walk 50 feet with 2 turns activity did not occur: Safety/medical concerns         Walk 150 feet activity   Assist Walk 150 feet activity did not  occur: Safety/medical concerns         Walk 10 feet on uneven surface  activity   Assist Walk 10 feet on uneven surfaces activity did not occur: Safety/medical concerns         Wheelchair     Assist Will patient use wheelchair at discharge?: Yes Type of Wheelchair: Power Wheelchair activity did not occur: Safety/medical concerns  Wheelchair assist level: Independent Max wheelchair distance: 150'    Wheelchair 50 feet with 2 turns activity    Assist        Assist Level: Independent   Wheelchair 150 feet activity     Assist Wheelchair 150 feet activity did not occur: Safety/medical concerns   Assist Level: Independent    Medical Problem List and Plan: 1.Functional and mobility deficitssecondary to major multiple trauma including numerous soft tissue injuries, pelvic fractures, and left sciatic nerve injury --Continue CIR therapies including PT, OT SLP   - therapy  3 hr/ 5 days per week  -team conference   2. Antithrombotics: -DVT/anticoagulation:Pharmaceutical:Lovenox -antiplatelet therapy: N/A 3. Pain Management:Fentanyl 50 mcg/72 hrs with dilaudid prn for breakthrough pain.  -pain seems better overall  -consider decreasing fentanyl patch at next change  -knee ROM improving!   extensive fractures appear stable, hardware in place and generally everything in alignment. No signs of HO in knee or hip.   -continue ROM exercises with PT. No CPM at this time 4. Mood:LCSW to follow for evaluation and support. Team to provide ego support. -antipsychotic agents: N/A  5. Neuropsych: This patientiscapable of making decisions on hisown behalf. 6.Multipledegloving injuries with necrosis/Skin/Wound Care:continue local care to ostomy and tube site. Dressings in place for back/buttock areas.  -WOC RN follow up appreciated.   -  dressing changes to BID given increased output from wounds  -barrier  cream to groin/scrotum/buttocks  -contact plastic surgery re: follow up before he goes home, ongoing drainage from graft/wound site on buttock/low back -PRAFO's for bilateral heels      7. Fluids/Electrolytes/Nutrition: upgraded to regular diet!!  -continue megace for appetite, eating 75- 100%  -PEG removed 6/29. Area already closed  -continue supplements  - prealbumin up to 28 (continues to trend up)  -check labs again Thursday 8. Acute on chronic respiratory failure: Decannulated. -stoma closed  -dc mucinex  - continue flutter valve, IS 9.Recent SIRSdue to enterococcus XBJ:[id number]:Cefepime 6/7--6/14 and macrodantin thorough 6/13 ---both completed 10.Urethral disruption/ SPCdependent: Monthly change and flush daily and prn to prevent blockage. 11. Anemia of chronic disease:  -iron panel c/w with ACD  -improve nutritional status  -hgb 10.7 12. Hypernatremia:  -improving   13. Tachycardia: Continue to monitor HR tid. Lopressor increased to 50 mg tid on 6/9 14.Encephalopathy:    -off provigil and amantadine 15. Anxiety disorder:Currently on multiple medications--  -tapered seroquel to 25mg  qhs  -continue  zoloft 25mg  daily      -decrease klonopin to 0.25mg  daily, ultimately change to daily  -mood improved 16.  Gastroparesis: reglan, weaning 17. Hypokalemia:    -follow up potassium 4.0, continue kdur but reduce to 20me qd  LOS: 18 days A FACE TO FACE EVALUATION WAS PERFORMED  Ranelle OysterZachary T Rayne Loiseau 12/11/2018, 1:05 PM

## 2018-12-11 NOTE — Progress Notes (Signed)
Occupational Therapy Session Note  Patient Details  Name: Christopher [name] MRN: [id number] Date of Birth: Nov 19, 1965  Today's Date: 12/11/2018 OT Individual Time: 1300-1355 OT Individual Time Calculation (min): 55 min    Short Term Goals: Week 3:  OT Short Term Goal 1 (Week 3): STG=LTG due to LOS  Skilled Therapeutic Interventions/Progress Updates:    Pt seen for OT ADL bathing/dressing session. Pt in supine upon arrival, denying pain at rest, however, declines OOB tx this PM due to fatigue. Pt desiring to complete bathing/dressing routine from bed position this session.  He completed UB bathing/dressing with assist to doff shirt and pull down in back. He rolled with min-mod A for therapist to wash his back, remainder of UB completed with set-up/supervision.  Total A for LB bathing/dressing. Pt hypersensitive to touch on L LE at thigh and foot. Education provided regarding desensitization techniques. He rolled with min-mod A in order for pants to be pulled up. He tolerated socks being donned. Pt left in supine at end of session, all needs in reach. Education/discussion throughout session regarding modified ADLs, directing caregivers for mobility techniques, energy conssnervation, and d/c planning.   Therapy Documentation Precautions:  Precautions Precautions: Fall, Other (comment) Precaution Comments: PEG, ostomy, SP catheter, multiple healing skin grafts/wounds periarea and B LEs Restrictions Weight Bearing Restrictions: No RLE Weight Bearing: Weight bearing as tolerated LLE Weight Bearing: Weight bearing as tolerated   Therapy/Group: Individual Therapy  Fenix Rorke L 12/11/2018, 7:06 AM

## 2018-12-12 ENCOUNTER — Encounter (HOSPITAL_COMMUNITY): Payer: Self-pay | Admitting: Occupational Therapy

## 2018-12-12 ENCOUNTER — Ambulatory Visit (HOSPITAL_COMMUNITY): Payer: Self-pay | Admitting: Physical Therapy

## 2018-12-12 ENCOUNTER — Inpatient Hospital Stay (HOSPITAL_COMMUNITY): Payer: Self-pay | Admitting: Physical Therapy

## 2018-12-12 ENCOUNTER — Encounter (HOSPITAL_COMMUNITY): Payer: Self-pay | Admitting: Speech Pathology

## 2018-12-12 MED ORDER — POLYETHYLENE GLYCOL 3350 17 G PO PACK: 17.0000 g | PACK | Freq: Every day | ORAL | 0 refills | Status: DC

## 2018-12-12 MED ORDER — SENNOSIDES-DOCUSATE SODIUM 8.6-50 MG PO TABS: 2.0000 | ORAL_TABLET | Freq: Every day | ORAL | 1 refills | Status: DC

## 2018-12-12 MED ORDER — CLONAZEPAM 0.5 MG PO TABS: 0.2500 mg | ORAL_TABLET | Freq: Three times a day (TID) | ORAL | 0 refills | Status: DC | PRN

## 2018-12-12 MED ORDER — PANTOPRAZOLE SODIUM 40 MG PO TBEC: 40.0000 mg | DELAYED_RELEASE_TABLET | Freq: Two times a day (BID) | ORAL | 1 refills | Status: DC

## 2018-12-12 MED ORDER — QUETIAPINE FUMARATE 25 MG PO TABS: 25.0000 mg | ORAL_TABLET | Freq: Every day | ORAL | 1 refills | Status: DC

## 2018-12-12 MED ORDER — MELATONIN 3 MG PO TABS: 3.0000 mg | ORAL_TABLET | Freq: Every day | ORAL | 1 refills | Status: DC

## 2018-12-12 MED ORDER — PRO-STAT SUGAR FREE PO LIQD: 30.0000 mL | Freq: Three times a day (TID) | ORAL | 0 refills | Status: DC

## 2018-12-12 MED ORDER — GABAPENTIN 300 MG PO CAPS: 300.0000 mg | ORAL_CAPSULE | Freq: Three times a day (TID) | ORAL | 1 refills | Status: DC

## 2018-12-12 MED ORDER — CLONIDINE HCL 0.1 MG PO TABS: 0.1000 mg | ORAL_TABLET | Freq: Two times a day (BID) | ORAL | 1 refills | Status: DC

## 2018-12-12 MED ORDER — POTASSIUM CHLORIDE CRYS ER 20 MEQ PO TBCR: 20.0000 meq | EXTENDED_RELEASE_TABLET | Freq: Every day | ORAL | 1 refills | Status: DC

## 2018-12-12 MED ORDER — ADULT MULTIVITAMIN W/MINERALS CH: 1.0000 | ORAL_TABLET | Freq: Every day | ORAL | 0 refills | Status: DC

## 2018-12-12 MED ORDER — METHOCARBAMOL 500 MG PO TABS: 500.0000 mg | ORAL_TABLET | Freq: Four times a day (QID) | ORAL | 1 refills | Status: DC | PRN

## 2018-12-12 MED ORDER — PROCHLORPERAZINE MALEATE 5 MG PO TABS: 5.0000 mg | ORAL_TABLET | Freq: Four times a day (QID) | ORAL | 0 refills | Status: DC | PRN

## 2018-12-12 MED ORDER — SERTRALINE HCL 25 MG PO TABS: 25.0000 mg | ORAL_TABLET | Freq: Every day | ORAL | 1 refills | Status: DC

## 2018-12-12 MED ORDER — TRAMADOL HCL 50 MG PO TABS: 50.0000 mg | ORAL_TABLET | Freq: Four times a day (QID) | ORAL | 0 refills | Status: DC | PRN

## 2018-12-12 MED ORDER — GERHARDT'S BUTT CREAM: 1.0000 "application " | TOPICAL_CREAM | Freq: Two times a day (BID) | CUTANEOUS | 0 refills | Status: DC

## 2018-12-12 MED ORDER — FENTANYL 50 MCG/HR TD PT72: 1.0000 | MEDICATED_PATCH | TRANSDERMAL | 0 refills | Status: DC

## 2018-12-12 MED ORDER — DM-GUAIFENESIN ER 30-600 MG PO TB12: 1.0000 | ORAL_TABLET | Freq: Two times a day (BID) | ORAL | 1 refills | Status: DC

## 2018-12-12 MED ORDER — METOPROLOL TARTRATE 50 MG PO TABS: 50.0000 mg | ORAL_TABLET | Freq: Three times a day (TID) | ORAL | 1 refills | Status: DC

## 2018-12-12 MED ORDER — MEGESTROL ACETATE 400 MG/10ML PO SUSP: 400.0000 mg | Freq: Two times a day (BID) | ORAL | 0 refills | Status: DC

## 2018-12-12 MED ORDER — HYDROCODONE-ACETAMINOPHEN 5-325 MG PO TABS: 1.0000 | ORAL_TABLET | ORAL | 0 refills | Status: DC | PRN

## 2018-12-12 NOTE — Progress Notes (Signed)
Speech Language Pathology Discharge Summary  Patient Details  Name: KHAYREE DELELLIS MRN: [id number] Date of Birth: [birth year]  Today's Date: 12/12/2018 SLP Individual Time: 1000-1015 SLP Individual Time Calculation (min): 15 min   Skilled Therapeutic Interventions:  Skilled treatment session focused on competing caregiver education with pt's wife. Education provided on general aspiration precautions and baseline cognitive deficits. All questions answered to both pt and wife's satisfaction.      Patient has met 9 of 9 long term goals.  Patient to discharge at overall Modified Independent;Supervision level.   Clinical Impression/Discharge Summary:   Pt has made great progress in skilled ST sessions. As a result his cognition has returned to baseline and he is consuming regular with thin liquids. No follow up ST is indicated.   Care Partner:  Caregiver Able to Provide Assistance: Yes  Type of Caregiver Assistance: Physical  Recommendation:  24 hour supervision/assistance;None      Equipment:   N/A  Reasons for discharge: Treatment goals met   Patient/Family Agrees with Progress Made and Goals Achieved: Yes    Darleny Sem 12/12/2018, 4:39 PM

## 2018-12-12 NOTE — Progress Notes (Signed)
[facility] PHYSICAL MEDICINE & REHABILITATION PROGRESS NOTE   Subjective/Complaints: No new complaints. Pain is better today. Excited about getting home  ROS: Patient denies fever, rash, sore throat, blurred vision, nausea, vomiting, diarrhea, cough, shortness of breath or chest pain,  headache, or mood change.  .     Objective:   No results found. Recent Labs    12/10/18 0731  WBC 9.1  HGB 11.9*  HCT 38.3*  PLT 434*   Recent Labs    12/10/18 0731  NA 137  K 4.0  CL 104  CO2 22  GLUCOSE 94  BUN 25*  CREATININE 0.39*  CALCIUM 10.8*    Intake/Output Summary (Last 24 hours) at 12/12/2018 0912 Last data filed at 12/12/2018 0646 Gross per 24 hour  Intake 600 ml  Output 1300 ml  Net -700 ml     Physical Exam: Vital Signs Blood pressure 115/73, pulse 78, temperature 98.7 F (37.1 C), resp. rate 16, height 6\' 2"  (1.88 m), weight 69.5 kg, SpO2 96 %. Constitutional: No distress . Vital signs reviewed. HEENT: EOMI, oral membranes moist Neck: supple Cardiovascular: RRR without murmur. No JVD    Respiratory: CTA Bilaterally without wheezes or rales. Normal effort    GI: BS +, non-tender, non-distended, ostomy sealed, peg wound dry Genitourinary:   SPC intact with straw colored urine Musculoskeletal:  Comments: left knee rom 30-40 degrees--no further improvement  Neurological: He is alert and o x 3.  UE 3- to 3/5 deltoids, biceps, triceps, wrist/hands 3+/5. RLE 2/5 prox to distal. LLE with trace to 1-2/5 KE, HF, 0/5 otherwise without change. Ongoing patchy mostly sciatic distribution sensory loss LLE.  RLE without obvious sensory findings. Intentional tremor bilateral UE remains Skin: He isnot diaphoretic. Healed grafts bilateral upper thighs. Scarring, granulation.   Low back area/buttock with scattered pockets of graft, granulation/i did not visualize today Psychiatric:  Pleasant and cooperative     Assessment/Plan: 1. Functional deficits secondary to  debility, polytrauma  which require 3+ hours per day of interdisciplinary therapy in a comprehensive inpatient rehab setting.  Physiatrist is providing close team supervision and 24 hour management of active medical problems listed below.  Physiatrist and rehab team continue to assess barriers to discharge/monitor patient progress toward functional and medical goals  Care Tool:  Bathing    Body parts bathed by patient: Right arm, Left arm, Chest, Abdomen, Face   Body parts bathed by helper: Front perineal area, Buttocks, Right upper leg, Left upper leg, Right lower leg, Left lower leg Body parts n/a: Front perineal area, Buttocks, Right upper leg, Left upper leg, Right lower leg, Left lower leg(Did not complete LB this session)   Bathing assist Assist Level: 2 Helpers     Upper Body Dressing/Undressing Upper body dressing   What is the patient wearing?: Pull over shirt    Upper body assist Assist Level: Minimal Assistance - Patient > 75%    Lower Body Dressing/Undressing Lower body dressing    Lower body dressing activity did not occur: Safety/medical concerns What is the patient wearing?: Pants     Lower body assist Assist for lower body dressing: Total Assistance - Patient < 25%     Toileting Toileting Toileting Activity did not occur Press photographer(Clothing management and hygiene only): Safety/medical concerns  Toileting assist       Transfers Chair/bed transfer  Transfers assist  Chair/bed transfer activity did not occur: Safety/medical concerns  Chair/bed transfer assist level: Moderate Assistance - Patient 50 - 74%  Locomotion Ambulation   Ambulation assist   Ambulation activity did not occur: Safety/medical concerns  Assist level: 2 helpers Assistive device: Walker-platform Max distance: 12'   Walk 10 feet activity   Assist     Assist level: 2 helpers Assistive device: Walker-platform   Walk 50 feet activity   Assist Walk 50 feet with 2 turns  activity did not occur: Safety/medical concerns         Walk 150 feet activity   Assist Walk 150 feet activity did not occur: Safety/medical concerns         Walk 10 feet on uneven surface  activity   Assist Walk 10 feet on uneven surfaces activity did not occur: Safety/medical concerns         Wheelchair     Assist Will patient use wheelchair at discharge?: Yes Type of Wheelchair: Power Wheelchair activity did not occur: Safety/medical concerns  Wheelchair assist level: Independent Max wheelchair distance: 150'    Wheelchair 50 feet with 2 turns activity    Assist        Assist Level: Independent   Wheelchair 150 feet activity     Assist Wheelchair 150 feet activity did not occur: Safety/medical concerns   Assist Level: Independent    Medical Problem List and Plan: 1.Functional and mobility deficitssecondary to major multiple trauma including numerous soft tissue injuries, pelvic fractures, and left sciatic nerve injury --Continue CIR therapies including PT, OT SLP   - therapy  3 hr/ 5 days per week  -family ed upcoming   2. Antithrombotics: -DVT/anticoagulation:Pharmaceutical:Lovenox -antiplatelet therapy: N/A 3. Pain Management:Fentanyl 50 mcg/72 hrs with dilaudid prn for breakthrough pain.  -pain seems better overall  -continue current fentanyl patch given his multitude of ortho and plastics issues  -knee ROM improving---will order home CPM unit   extensive fractures appear stable, hardware in place and generally everything in alignment. No signs of HO in knee or hip.   -continue ROM exercises with PT. No CPM at this time 4. Mood:LCSW to follow for evaluation and support. Team to provide ego support. -antipsychotic agents: N/A  5. Neuropsych: This patientiscapable of making decisions on hisown behalf. 6.Multipledegloving injuries with necrosis/Skin/Wound Care:continue local care  to ostomy and tube site. Dressings in place for back/buttock areas.  -WOC RN follow up appreciated.   -  dressing changes to BID given increased output from wounds  -barrier cream to groin/scrotum/buttocks  - plastic surgery to follow up before he goes home, ongoing drainage from graft/wound site on buttock/low back -PRAFO's for bilateral heels      7. Fluids/Electrolytes/Nutrition: upgraded to regular diet!!  -continue megace for appetite, eating 75- 100%  -PEG removed 6/29. site closed  -continue supplements  - prealbumin up to 28 (continues to trend up)  -check labs again Thursday 8. Acute on chronic respiratory failure: Decannulated. -stoma closed  -dc mucinex  - continue flutter valve, IS 9.Recent SIRSdue to enterococcus ZOX:[id number]:Cefepime 6/7--6/14 and macrodantin thorough 6/13 ---both completed 10.Urethral disruption/ SPCdependent: Monthly change and flush daily and prn to prevent blockage. 11. Anemia of chronic disease:  -iron panel c/w with ACD  -improve nutritional status  -hgb 10.7 12. Hypernatremia:  -improving   13. Tachycardia: Continue to monitor HR tid. Lopressor increased to 50 mg tid on 6/9 14.Encephalopathy:    -off provigil and amantadine 15. Anxiety disorder:Currently on multiple medications--  -tapered seroquel to 25mg  qhs  -continue  zoloft 25mg  daily      -decrease klonopin to 0.25mg  daily, dc tomorrow, keep  prn doses  -mood improved 16.  Gastroparesis: reglan, weaning 17. Hypokalemia:    -follow up potassium 4.0, continue kdur but reduced to 86me qd        LOS: 19 days A FACE TO [place] 12/12/2018, 9:12 AM

## 2018-12-12 NOTE — Progress Notes (Signed)
Physical Therapy Session Note  Patient Details  Name: Christopher [name] MRN: [id number] Date of Birth: 28-Nov-1965  Today's Date: 12/12/2018 PT Individual Time: 0900-1000; 1400-1430 PT Individual Time Calculation (min): 60 min and 30 min  Short Term Goals: Week 3:  PT Short Term Goal 1 (Week 3): =LTG due to ELOS  Skilled Therapeutic Interventions/Progress Updates:    Session 1: Pt received seated in bed with wife [name] present for hands-on family training. No complaints of pain at rest, does have onset of back in back with mobility that improves once resting in supine again. Rolling L/R with min A and use of bedrails. Education with [name] about how to best assist pt with rolling in bed, pressure relief, management of changing bed positions, etc. Demonstrated how to position bed to assist pt with scooting up towards [facility] then therapist and pt's wife dependently scooted pt up. Supine to sit with mod A with HOB slightly elevated and use of bedrails. Demonstrated how to assist pt with supine to/from sit transfer with mod A then had [name] perform return demo of transfer. She demonstrates good carryover of how to assist pt with bed mobility. Sit to stand with mod A to [place] from elevated bed. Stand pivot transfer bed to w/c with PFRW and mod A with cueing for LLE management and eccentric control when sitting. Pt's wife demonstrates good safety and ability to assist pt with sit to stand and stand pivot transfers. Pt does report some dizziness while sitting on EOB, BP 125/87 and dizziness subsides with seated rest break. Pt also has onset of nausea in standing position that does not subside once returned to supine. RN able to provide anti-nausea medication. Pt left semi-reclined in bed with needs in reach at end of session, wife present. Will continue with family education with SLP and OT later this AM as well as tomorrow prior to d/c home on 7/3.  Session 2: Pt received seated in bed, declines any OOB  mobility this PM but agreeable to BLE PROM. No complaints of pain at rest but does report pain in LE with ROM. Supine BLE hip, knee, and ankle PROM in available planes of motion to work on Office manager. Pt continues to exhibit significantly limited ROM in LLE as compared to RLE. Pt left seated in bed set up for snack with needs in reach at end of session.  Therapy Documentation Precautions:  Precautions Precautions: Fall, Other (comment) Precaution Comments: PEG, ostomy, SP catheter, multiple healing skin grafts/wounds periarea and B LEs Restrictions Weight Bearing Restrictions: No RLE Weight Bearing: Weight bearing as tolerated LLE Weight Bearing: Weight bearing as tolerated    Therapy/Group: Individual Therapy   [name], PT, DPT  12/12/2018, 12:19 PM

## 2018-12-12 NOTE — Progress Notes (Signed)
Occupational Therapy Session Note  Patient Details  Name: Christopher [name] MRN: [id number] Date of Birth: September 03, 1965  Today's Date: 12/12/2018 OT Individual Time: 1100-1205 OT Individual Time Calculation (min): 65 min    Short Term Goals: Week 3:  OT Short Term Goal 1 (Week 3): STG=LTG due to LOS  Skilled Therapeutic Interventions/Progress Updates:    Pt seen for OT session focusing on caregiver education with pt's significant other Christopher [name]. Pt with complaints of back pain upon arrival, RN made aware and administered pain medication at start of session. Pt choosing to complete bathing/dressing routine from bed level, stating this is what he plans to do at d/c. Education and return teach back provided with how to assist pt with bed mobility and techniques for completing bathing/dressing routine at bed level. Educated regarding importance of proper skin care, pt's hyper-sensitivity and functional implications and how to de-sensitize L LE. Pt completed bed mobility with min A while total A provided for LB bathing/dressing.  Pt agreeable to transferring to power w/c in order to complete UB bathing/dressing. Christopher [name] assisted with all aspects of mobility (having learned in PT family ed session eariler in the AM). He transferred to sitting EOB with max A and completed min-mod A stand pivot transfer to power w/c using PFRW. Pt completed UB dressing with min A from w/c level.  Pt with increased complaints of back pain and nausea upon getting into w/c and requesting to return to supine at end of session. He transferred back to bed in same manner as described above.  Pt left in supine with all needs in reach and Christopher [name] present. Education provided throughout session regarding role of OT, pt's COF, continuum of care, DME, and d/c planning.   Therapy Documentation Precautions:  Precautions Precautions: Fall, Other (comment) Precaution Comments: PEG, ostomy, SP catheter, multiple healing skin  grafts/wounds periarea and B LEs Restrictions Weight Bearing Restrictions: No RLE Weight Bearing: Weight bearing as tolerated LLE Weight Bearing: Weight bearing as tolerated   Therapy/Group: Individual Therapy  Erma Joubert L 12/12/2018, 7:04 AM

## 2018-12-13 ENCOUNTER — Inpatient Hospital Stay (HOSPITAL_COMMUNITY): Payer: Self-pay | Admitting: Occupational Therapy

## 2018-12-13 ENCOUNTER — Ambulatory Visit (HOSPITAL_COMMUNITY): Payer: Self-pay | Admitting: Physical Therapy

## 2018-12-13 ENCOUNTER — Encounter (HOSPITAL_COMMUNITY): Payer: Self-pay | Admitting: Occupational Therapy

## 2018-12-13 LAB — BASIC METABOLIC PANEL
Anion gap: 9 (ref 5–15)
BUN: 22 mg/dL — ABNORMAL HIGH (ref 6–20)
CO2: 25 mmol/L (ref 22–32)
Calcium: 10.8 mg/dL — ABNORMAL HIGH (ref 8.9–10.3)
Chloride: 103 mmol/L (ref 98–111)
Creatinine, Ser: 0.57 mg/dL — ABNORMAL LOW (ref 0.61–1.24)
GFR calc Af Amer: >60 mL/min (ref >60)
GFR calc non Af Amer: >60 mL/min (ref >60)
Glucose, Bld: 98 mg/dL (ref 70–99)
Potassium: 4.5 mmol/L (ref 3.5–5.1)
Sodium: 137 mmol/L (ref 135–145)

## 2018-12-13 NOTE — Progress Notes (Signed)
Occupational Therapy Discharge Summary  Patient Details  Name: Christopher [name] MRN: [id number] Date of Birth: 05-Oct-1965    Patient has met 4 of 4 long term goals due to improved activity tolerance, improved balance, postural control, ability to compensate for deficits and improved coordination.  Patient to discharge at [facility] Max Assist level.  Patient's care partner is independent to provide the necessary physical assistance at discharge.  Pt's fiance and father-in-law have completed hands on family education and voice and demonstrate willing and ableness to provide the max A level of assist pt requires.  LB bathing/dressing completed total A at bed level at this time for safety, to reduce caregiver burden and due to pt's significant activity tolerance deficits. UB bathing/dressing completed bed or w/c level with set-up- min A required. He is completing basic stand pivot transfers w/c<> power w/c with PFRW and min-mod A.  Pt with L LE knee and hip contractures with very limited ROM. He is unable to tolerate greater than ~30 minutes up in chair due to pain from sacral wounds and generalized fatigue.  Pt being provided with [facility] lift as transfer method option if pain/fatigue limits ability to complete stand pivot transfers.    Recommendation:  Patient will benefit from ongoing skilled OT services in home health setting to continue to advance functional skills in the area of BADL and Reduce care partner burden.  Equipment: air matress hospital bed, power w/c, [employer] fast streamer, PFRW  Reasons for discharge: treatment goals met and discharge from hospital  Patient/family agrees with progress made and goals achieved: Yes  OT Discharge Precautions/Restrictions  Precautions Precautions: (P) Fall;Other (comment)(Simultaneous filing. User may not have seen previous data.) Precaution Comments: (P) PEG, ostomy, SP catheter, multiple healing skin grafts/wounds periarea and B LEs(Simultaneous filing.  User may not have seen previous data.) Restrictions Weight Bearing Restrictions: (P) No(Simultaneous filing. User may not have seen previous data.) RLE Weight Bearing: (P) Weight bearing as tolerated LLE Weight Bearing: (P) Weight bearing as tolerated Vision Baseline Vision/History: Wears glasses Wears Glasses: Reading only Patient Visual Report: No change from baseline Vision Assessment?: No apparent visual deficits Perception  Perception: Within Functional Limits Praxis Praxis: Intact Cognition Overall Cognitive Status: History of cognitive impairments - at baseline Arousal/Alertness: Awake/alert Orientation Level: Oriented X4 Attention: (P) Selective Sustained Attention: (P) Appears intact Selective Attention: (P) Appears intact Memory: (P) Appears intact Awareness: Appears intact Problem Solving: Appears intact Safety/Judgment: Appears intact Sensation Sensation Light Touch: Impaired Detail Light Touch Impaired Details: Impaired LLE(Hypersensitive to touch L LE) Coordination Gross Motor Movements are Fluid and Coordinated: No Fine Motor Movements are Fluid and Coordinated: No Coordination and Movement Description: Limited due to pain, sacral/ lower back wounds and L LE contractures Motor  Motor Motor: Other (comment) Motor - Skilled Clinical Observations: impaired ROM and strength secondary to multiple orthopedic procedures, pain and generalized deconditioning. Motor - Discharge Observations: (P) impaired ROM and strength 2/2 multiple surgeries and prolonged hospital stay Mobility  Bed Mobility Bed Mobility: (P) Rolling Right;Rolling Left;Supine to Sit;Sit to Supine Rolling Right: (P) Minimal Assistance - Patient > 75% Rolling Left: (P) Minimal Assistance - Patient > 75% Supine to Sit: (P) Moderate Assistance - Patient 50-74% Sit to Supine: (P) Moderate Assistance - Patient 50-74% Transfers Sit to Stand: (P) Moderate Assistance - Patient 50-74%  Trunk/Postural  Assessment  Cervical Assessment Cervical Assessment: Exceptions to WFL(Forward head) Thoracic Assessment Thoracic Assessment: Exceptions to WFL(Kyphotic; Rounded shoulders) Lumbar Assessment Lumbar Assessment: Exceptions to WFL(Very limited lumbar/pelvic mobility 2/2 hip  contractures and pain) Postural Control Postural Control: Deficits on evaluation(Impaired/ insufficient 2/2 contractures, skin grafts, pain and generalized weakness)  Balance Balance Balance Assessed: Yes Static Sitting Balance Static Sitting - Balance Support: Feet supported;Bilateral upper extremity supported Static Sitting - Level of Assistance: 4: Min assist;5: Stand by assistance Static Sitting - Comment/# of Minutes: Sitting EOB. Requires UE support due to pain Dynamic Sitting Balance Dynamic Sitting - Balance Support: Feet supported;During functional activity;Bilateral upper extremity supported Dynamic Sitting - Level of Assistance: 4: Min assist;3: Mod assist Sitting balance - Comments: Sitting EOB Static Standing Balance Static Standing - Balance Support: During functional activity;Bilateral upper extremity supported Static Standing - Level of Assistance: 5: Stand by assistance;4: Min assist Static Standing - Comment/# of Minutes: Standing with [place] Dynamic Standing Balance Dynamic Standing - Balance Support: During functional activity;Bilateral upper extremity supported Dynamic Standing - Level of Assistance: 4: Min assist;3: Mod assist Dynamic Standing - Comments: Standing with PFRW Extremity/Trunk Assessment RUE Assessment RUE Assessment: Not tested General Strength Comments: Not formally assessed 2/2 generalized pain all over. Grossly 3/5 throughout with tremors noted LUE Assessment LUE Assessment: Not tested General Strength Comments: Not formally assessed 2/2 generalized pain all over. Grossly 3/5 throughout with tremors noted   Watt Geiler L 12/13/2018, 3:44 PM

## 2018-12-13 NOTE — Progress Notes (Signed)
Saw patient this afternoon. His sacral pressure wound is shallow with pink tissue exposed, appears to be granulating, but is difficult to heal as it is under constant pressure. Dressing has been changed BID by staff per nursing. Dressing at this time had serosanguinous with mild purulent tint. No sign of infection. Erythema noted around wound due to tape keeping bandages in place. Mild skin abrasions from tape.   Patient is being d'ced to home, will be taken care of by family (fiance and others) and will have a nurse coming to assist, per patient.  Will follow up with patient in the clinic.

## 2018-12-13 NOTE — Progress Notes (Signed)
Physical Therapy Discharge Summary  Patient Details  Name: Christopher [name] MRN: [id number] Date of Birth: 1966-04-27  Today's Date: 12/13/2018 PT Individual Time: 0900-1000 PT Individual Time Calculation (min): 60 min    Patient has met 8 of 9 long term goals due to improved activity tolerance, improved balance, improved postural control, increased strength, increased range of motion and ability to compensate for deficits.  Patient to discharge at a wheelchair level [place].   Patient's care partner is independent to provide the necessary physical assistance at discharge. Pt is currently at min A for rolling in bed, mod A for supine to/from sit, mod A for sit to stand to [place] and for SPT with PFRW. Pt is able to ambulate short distances with PFRW and skilled assist x 2 although gait is not a functional goal for him at this time. Pt's wife and father-in-law have completed hands on family training with how to assist pt with bed mobility, transfers, and how to use manual hoyer lift for bed to chair transfers for when pt is unable to stand to complete transfers due to pain and limited mobility due to contractures. Pt's family demonstrates good safety and ability to assist pt with mobility and transfers as necessary.  Reasons goals not met: Pt did not meet car transfer goal as he did not tolerate positioning required to perform this transfer. Pt plans to utilize ambulance transport home. Pt met all other rehab goals adequately enough for safe d/c home.  Recommendation:  Patient will benefit from ongoing skilled PT services in home health setting to continue to advance safe functional mobility, address ongoing impairments in strength, ROM, endurance, activity tolerance, pain management, safety, independence with functional mobility, and minimize fall risk.  Equipment: Hospital bed, slide board, manual hoyer and sling, PFRW, power wheelchair  Reasons for discharge: treatment goals met and discharge  from hospital  Patient/family agrees with progress made and goals achieved: Yes   Skilled Intervention: Pt received supine in bed, agreeable to PT session. Pt reports pain is controlled during session. Pt's wife and father in law present for 2nd hands on family training session. Demonstration of how to perform manual hoyer transfer bed to/from wheelchair with return demo from family. Pt is min A for rolling L/R for placement of sling. Manual hoyer transfer bed to/from chair. Demonstrated how to perform sling removal and education with patient and family about pressure relief and not leaving the sling under the pt while he is seated in the chair. Demonstrated how to place sling back under the patient. Manual hoyer transfer back to bed. Rolling L/R with min A for sling removal. Also educated family on transferring the pt in/out on the R side of the bed due limited mobility in L knee. Provided handout for BLE PROM stretches for family: hip flex, knee flex, and ankle DF/PF and eversion/inversion stretches for contracture management. Pt and family demo good understanding of all education provided and demo good safety and ability to assist pt with transfers. Pt left semi-reclined in bed with needs in reach at end of session.  PT Discharge Precautions/Restrictions Precautions Precautions: Fall;Other (comment) Precaution Comments: PEG, ostomy, SP catheter, multiple healing skin grafts/wounds periarea and B LEs Restrictions Weight Bearing Restrictions: No RLE Weight Bearing: Weight bearing as tolerated LLE Weight Bearing: Weight bearing as tolerated Vision/Perception  Perception Perception: Within Functional Limits Praxis Praxis: Intact  Cognition Overall Cognitive Status: History of cognitive impairments - at baseline Arousal/Alertness: Awake/alert Orientation Level: Oriented X4 Attention: Selective Sustained  Attention: Appears intact Selective Attention: Appears intact Memory: Appears  intact Awareness: Appears intact Problem Solving: Appears intact Safety/Judgment: Appears intact Sensation Sensation Light Touch: Impaired Detail Light Touch Impaired Details: Impaired LLE(Hypersensitive to touch L LE) Coordination Gross Motor Movements are Fluid and Coordinated: No Fine Motor Movements are Fluid and Coordinated: No Coordination and Movement Description: Limited due to pain, sacral/ lower back wounds and L LE contractures Motor  Motor Motor: Other (comment) Motor - Skilled Clinical Observations: impaired ROM and strength secondary to multiple orthopedic procedures, pain and generalized deconditioning. Motor - Discharge Observations: impaired ROM and strength 2/2 multiple surgeries and prolonged hospital stay  Mobility Bed Mobility Bed Mobility: Rolling Right;Rolling Left;Supine to Sit;Sit to Supine Rolling Right: Minimal Assistance - Patient > 75% Rolling Left: Minimal Assistance - Patient > 75% Supine to Sit: Moderate Assistance - Patient 50-74% Sit to Supine: Moderate Assistance - Patient 50-74% Transfers Transfers: Ambulance person Sit to Stand: Moderate Assistance - Patient 50-74% Stand Pivot Transfers: Moderate Assistance - Patient 50 - 74% Stand Pivot Transfer Details: Verbal cues for sequencing;Verbal cues for technique;Verbal cues for precautions/safety;Verbal cues for gait pattern;Verbal cues for safe use of DME/AE;Manual facilitation for weight shifting;Manual facilitation for placement Transfer via Lift Equipment: (manual hoyer lift) Locomotion  Gait Ambulation: Yes Gait Assistance: 2 Helpers Gait Distance (Feet): 12 Feet Assistive device: Bilateral platform walker Gait Assistance Details: Verbal cues for sequencing;Verbal cues for technique;Verbal cues for precautions/safety;Verbal cues for gait pattern;Verbal cues for safe use of DME/AE Gait Assistance Details: assist x 2 for short distance gait with PFRW Gait Gait:  Yes Gait Pattern: Impaired Gait Pattern: Step-to pattern;Decreased step length - right;Decreased step length - left;Decreased stride length;Decreased hip/knee flexion - right;Decreased hip/knee flexion - left;Decreased dorsiflexion - left;Decreased weight shift to right;Decreased weight shift to left;Wide base of support Trunk - Stance Phase - Impaired Gait Pattern: Extension Knee - Swing Phase- Impaired Gait Pattern: Decreased flexion - Left Hip - Swing Phase- Impaired Gait Pattern: Decreased flexion - Left;Decreased flexion - Right Gait velocity: decreased Stairs / Additional Locomotion Stairs: No Wheelchair Mobility Wheelchair Mobility: Yes Wheelchair Assistance: Independent with Camera operator: Power Wheelchair Parts Management: Needs assistance Distance: 150  Trunk/Postural Assessment  Cervical Assessment Cervical Assessment: Exceptions to WFL(Forward head) Thoracic Assessment Thoracic Assessment: Exceptions to WFL(Kyphotic; Rounded shoulders) Lumbar Assessment Lumbar Assessment: Exceptions to WFL(Very limited lumbar/pelvic mobility 2/2 hip contractures and pain) Postural Control Postural Control: Deficits on evaluation(Impaired/ insufficient 2/2 contractures, skin grafts, pain and generalized weakness)  Balance Balance Balance Assessed: Yes Static Sitting Balance Static Sitting - Balance Support: Feet supported;Bilateral upper extremity supported Static Sitting - Level of Assistance: 4: Min assist;5: Stand by assistance Static Sitting - Comment/# of Minutes: Sitting EOB. Requires UE support due to pain Dynamic Sitting Balance Dynamic Sitting - Balance Support: Feet supported;During functional activity;Bilateral upper extremity supported Dynamic Sitting - Level of Assistance: 4: Min assist;3: Mod assist Sitting balance - Comments: Sitting EOB Static Standing Balance Static Standing - Balance Support: During functional activity;Bilateral upper extremity  supported Static Standing - Level of Assistance: 5: Stand by assistance;4: Min assist Static Standing - Comment/# of Minutes: Standing with [place] Dynamic Standing Balance Dynamic Standing - Balance Support: During functional activity;Bilateral upper extremity supported Dynamic Standing - Level of Assistance: 4: Min assist;3: Mod assist Dynamic Standing - Comments: Standing with PFRW Extremity Assessment  RUE Assessment RUE Assessment: Not tested General Strength Comments: Not formally assessed 2/2 generalized pain all over. Grossly 3/5 throughout with  tremors noted LUE Assessment LUE Assessment: Not tested General Strength Comments: Not formally assessed 2/2 generalized pain all over. Grossly 3/5 throughout with tremors noted RLE Assessment RLE Assessment: Exceptions to [facility] Passive Range of Motion (PROM) Comments: decreased hip and knee flexion Active Range of Motion (AROM) Comments: hip and knee flex grossly to 90 degrees, DF 15 from neutral General Strength Comments: at least 3/5 grossly LLE Assessment LLE Assessment: Exceptions to [facility] Passive Range of Motion (PROM) Comments: decreased hip and knee flexion, ankle DF and inv/eversion limited General Strength Comments: at least 3/5 LLE PROM (degrees) Left Knee Flexion: 65(improved from 40 degrees at eval)     [name], PT, DPT 12/13/2018, 3:51 PM

## 2018-12-13 NOTE — Progress Notes (Addendum)
Social Work Discharge Note   The overall goal for the admission was met for:   Discharge location: Yes - home with wife and children  Length of Stay: Yes - 21 days  Discharge activity level: Yes - w/c level mod/max A  Home/community participation: Yes   Services provided included: MD, RD, PT, OT, SLP, RN, Pharmacy, Neuropsych and SW  Financial Services: Worker's Comp  Follow-up services arranged: Home Health: PT/OT/RN from [place], DME: hospital bed with air alternating pressure pad; over the bed table; CPM; 36" slide board; Platform rolling walker; power wheelchair from [employer] and Patient/Family has no preference for HH/DME agencies  Comments (or additional information): Pt's wife came in for family education and feels prepared to care for pt at home.  Worker's Comp to arrange private duty CNA to assist while wife is working.  Patient/Family verbalized understanding of follow-up arrangements: Yes  Individual responsible for coordination of the follow-up plan: pt, his wife Annabell Howells [phone] and Worker's Comp case manager, Maudry Diego [phone]  Confirmed correct DME delivered: Trey Sailors 12/13/2018    Prevatt, Silvestre Mesi

## 2018-12-13 NOTE — Progress Notes (Signed)
Pt very anxious and emotional this shift. Wife and father present for wound/ostomy changes. Pt given PRN klonopin and Norco for anixety/pain. Cold washcloth on forehead. Dressing change education was complete with wife present. Cleansed all sites and dressing applied as ordered. Wife able to visualize all sites that need alginate applied and ABD to cover. Peg removal site was changed, site scabbed and dry. Ostomy teaching was complete as well. Wife stated she was familiar with ostomy d/t her father had one at one point. Showed the difference in bags and supplies and how to change/burp/empty bag. Explained Nursing has been changing 1-2x/week or prn if leaking. Wife verbalized she understood everything reviewed by RN and didn't have no further questions. Pt repositioned on R side and comfortable prior to RN leaving. Call bell in reach. Will cont to monitor.   Erie Noe, RN

## 2018-12-13 NOTE — Progress Notes (Signed)
Patient ID: Christopher [name], male   DOB: [birth year], 53 y.o.   MRN: [id number]      Diagnosis codes:   S32.811B; S72.002A; T79.A3XA; S37.30XA; S33.2XXA  Height:    6'2"            Weight:      69.5kgs/153lbs     Patient has debility with multiple fractures/wounds  from multiple traumas which require his head, pelvis, torso, and legs to be positioned in ways not feasible with a normal bed.  Head must be elevated at least 30 degrees or pt is at risk for pain and swallowing difficulties.    Pain and wounds require frequent and immediate changes in body position which cannot be achieved with a normal bed.

## 2018-12-13 NOTE — Progress Notes (Signed)
Occupational Therapy Session Note  Patient Details  Name: Christopher [name] MRN: [id number] Date of Birth: 1965/12/29  Today's Date: 12/13/2018 OT Individual Time: 0947-0962 OT Individual Time Calculation (min): 30 min  OT Missed Time: 45 Minutes (pain, fatigue, and nausea)   Short Term Goals: Week 3:  OT Short Term Goal 1 (Week 3): STG=LTG due to LOS  Skilled Therapeutic Interventions/Progress Updates:    Pt seen for OT session focusing on cont family education with pt's wife who was here yesterday as well as pt's father-in-law. Pt in supine upon arrival, complaints of nausea which has been on-going throughout the morning. RN made aware and administered medication during session.  Pt initially refusing OOB mobility, however, with encouragement of wife willing to attempt. Pt becoming very tearful, VCs for deep breathing technique and reassurance.  He transferred EOB>supine with wife providing mod A. Upon sitting EOB, pt stated he couldn't tolerate it and needed to return to supine. Mod A to return to supine.  Education provided during session regarding building OOB tolerance, DME, activity progression, importance of LE ROM and stretching and functional implications, continuum of care, and d/c planning.  Pt's wife voiced feeling comfortable with family ed completed yesterday and today with PT. Will attempt to make up missed time later this PM as pt able.   Therapy Documentation Precautions:  Precautions Precautions: Fall, Other (comment) Precaution Comments: PEG, ostomy, SP catheter, multiple healing skin grafts/wounds periarea and B LEs Restrictions Weight Bearing Restrictions: No RLE Weight Bearing: Weight bearing as tolerated LLE Weight Bearing: Weight bearing as tolerated   Therapy/Group: Individual Therapy  Lamonte Hartt L 12/13/2018, 6:58 AM

## 2018-12-13 NOTE — Progress Notes (Signed)
[place] PHYSICAL MEDICINE & REHABILITATION PROGRESS NOTE   Subjective/Complaints: Pt in good spirits. Wife present and happy to see him  ROS: Patient denies fever, rash, sore throat, blurred vision, nausea, vomiting, diarrhea, cough, shortness of breath or chest pain, headache, or mood change.   .     Objective:   No results found. No results for input(s): WBC, HGB, HCT, PLT in the last 72 hours. Recent Labs    12/13/18 0626  NA 137  K 4.5  CL 103  CO2 25  GLUCOSE 98  BUN 22*  CREATININE 0.57*  CALCIUM 10.8*    Intake/Output Summary (Last 24 hours) at 12/13/2018 1052 Last data filed at 12/13/2018 0815 Gross per 24 hour  Intake 860 ml  Output 2175 ml  Net -1315 ml     Physical Exam: Vital Signs Blood pressure 104/72, pulse 72, temperature 98.4 F (36.9 C), resp. rate 20, height 6\' 2"  (1.88 m), weight 69.5 kg, SpO2 97 %. Constitutional: No distress . Vital signs reviewed. HEENT: EOMI, oral membranes moist Neck: supple Cardiovascular: RRR without murmur. No JVD    Respiratory: CTA Bilaterally without wheezes or rales. Normal effort    GI: BS +, non-tender, non-distended, ostomy Genitourinary:   SPC intact with straw colored urine Musculoskeletal:  Comments: left knee rom 30-40 degrees--no further improvement  Neurological: He is alert and o x 3.  UE 3- to 3/5 deltoids, biceps, triceps, wrist/hands 3+/5. RLE 2/5 prox to distal. LLE with trace to 1-2/5 KE, HF, 0/5 otherwise without change. Sensory deficits LLE. RLE without obvious sensory findings. Intentional tremor bilateral UE ongoing Skin: He isnot diaphoretic. Healed grafts bilateral upper thighs. Scarring, granulation.   Low back area/buttock with scattered pockets of graft, granulation with drainage at edge of graft near iliac crest Psychiatric:  pleasant    Assessment/Plan: 1. Functional deficits secondary to debility, polytrauma  which require 3+ hours per day of interdisciplinary therapy in a  comprehensive inpatient rehab setting.  Physiatrist is providing close team supervision and 24 hour management of active medical problems listed below.  Physiatrist and rehab team continue to assess barriers to discharge/monitor patient progress toward functional and medical goals  Care Tool:  Bathing    Body parts bathed by patient: Right arm, Left arm, Chest, Abdomen, Face   Body parts bathed by helper: Front perineal area, Buttocks, Right upper leg, Left upper leg, Right lower leg, Left lower leg Body parts n/a: Front perineal area, Buttocks, Right upper leg, Left upper leg, Right lower leg, Left lower leg(Did not complete LB this session)   Bathing assist Assist Level: 2 Helpers     Upper Body Dressing/Undressing Upper body dressing   What is the patient wearing?: Pull over shirt    Upper body assist Assist Level: Minimal Assistance - Patient > 75%    Lower Body Dressing/Undressing Lower body dressing    Lower body dressing activity did not occur: Safety/medical concerns What is the patient wearing?: Pants     Lower body assist Assist for lower body dressing: 2 Helpers     Toileting Toileting Toileting Activity did not occur Press photographer(Clothing management and hygiene only): Safety/medical concerns  Toileting assist       Transfers Chair/bed transfer  Transfers assist  Chair/bed transfer activity did not occur: Safety/medical concerns  Chair/bed transfer assist level: Moderate Assistance - Patient 50 - 74%     Locomotion Ambulation   Ambulation assist   Ambulation activity did not occur: Safety/medical concerns  Assist level: 2  helpers Assistive device: Walker-platform Max distance: 12'   Walk 10 feet activity   Assist     Assist level: 2 helpers Assistive device: Walker-platform   Walk 50 feet activity   Assist Walk 50 feet with 2 turns activity did not occur: Safety/medical concerns         Walk 150 feet activity   Assist Walk 150 feet  activity did not occur: Safety/medical concerns         Walk 10 feet on uneven surface  activity   Assist Walk 10 feet on uneven surfaces activity did not occur: Safety/medical concerns         Wheelchair     Assist Will patient use wheelchair at discharge?: Yes Type of Wheelchair: Power Wheelchair activity did not occur: Safety/medical concerns  Wheelchair assist level: Independent Max wheelchair distance: 150'    Wheelchair 50 feet with 2 turns activity    Assist        Assist Level: Independent   Wheelchair 150 feet activity     Assist Wheelchair 150 feet activity did not occur: Safety/medical concerns   Assist Level: Independent    Medical Problem List and Plan: 1.Functional and mobility deficitssecondary to major multiple trauma including numerous soft tissue injuries, pelvic fractures, and left sciatic nerve injury --Continue CIR therapies including PT, OT SLP   - therapy  3 hr/ 5 days per week  -family ed today  -Patient to see Rehab MD/provider in the office for transitional care encounter in 1-2 weeks.    2. Antithrombotics: -DVT/anticoagulation:Pharmaceutical:Lovenox -antiplatelet therapy: N/A 3. Pain Management:Fentanyl 50 mcg/72 hrs with dilaudid prn for breakthrough pain.  -pain seems better overall  -continue current fentanyl patch given his multitude of ortho and plastics issues  -knee ROM improving---will order home CPM unit   extensive fractures appear stable, hardware in place and generally everything in alignment. No signs of HO in knee or hip.   -continue ROM exercises with PT. No CPM at this time 4. Mood:LCSW to follow for evaluation and support. Team to provide ego support. -antipsychotic agents: N/A  5. Neuropsych: This patientiscapable of making decisions on hisown behalf. 6.Multipledegloving injuries with necrosis/Skin/Wound Care:continue local care to ostomy and tube  site. Dressings in place for back/buttock areas.  -WOC RN follow up appreciated.   -  dressing changes to BID given increased output from wounds  -barrier cream to groin/scrotum/buttocks  - plastic surgery to follow up before he goes home, ongoing drainage from graft/wound site on buttock/low back -PRAFO's for bilateral heels      7. Fluids/Electrolytes/Nutrition: upgraded to regular diet!!  -continue megace for appetite, eating 75- 100%  -PEG removed 6/29. site closed  -continue supplements  - prealbumin up to 28 (continues to trend up)  -labs reviewed and look perfect today 8. Acute on chronic respiratory failure: Decannulated. -stoma closed  -dc'ed mucinex  - continue flutter valve, IS 9.Recent SIRSdue to enterococcus WUJ:[id number]:Cefepime 6/7--6/14 and macrodantin thorough 6/13 ---both completed 10.Urethral disruption/ SPCdependent: Monthly change and flush daily and prn to prevent blockage. 11. Anemia of chronic disease:  -iron panel c/w with ACD  -improve nutritional status  -hgb 10.7 12. Hypernatremia:  -improving   13. Tachycardia: Continue to monitor HR tid. Lopressor increased to 50 mg tid on 6/9 14.Encephalopathy:    -off provigil and amantadine 15. Anxiety disorder:Currently on multiple medications--  -tapered seroquel to 25mg  qhs  -continue  zoloft 25mg  daily      -decrease klonopin to 0.25mg  daily, dc tomorrow, keep  prn doses  -mood improved 16.  Gastroparesis: reglan, wean off today 17. Hypokalemia:    -follow up potassium 4.0, continue kdur but reduced to 70me qd        LOS: 20 days A FACE TO FACE EVALUATION WAS PERFORMED  Meredith Staggers 12/13/2018, 10:52 AM

## 2018-12-13 NOTE — Plan of Care (Signed)
  Problem: Consults Goal: RH GENERAL PATIENT EDUCATION Description: See Patient Education module for education specifics. Outcome: Progressing   Problem: RH BOWEL ELIMINATION Goal: RH STG MANAGE BOWEL WITH ASSISTANCE Description: STG Manage Bowel with max Assistance. Outcome: Progressing   Problem: RH BLADDER ELIMINATION Goal: RH STG MANAGE BLADDER WITH EQUIPMENT WITH ASSISTANCE Description: STG Manage Bladder With Equipment With  max Assistance Outcome: Progressing   Problem: RH SKIN INTEGRITY Goal: RH STG SKIN FREE OF INFECTION/BREAKDOWN Description: Manage with max assist Outcome: Progressing Goal: RH STG MAINTAIN SKIN INTEGRITY WITH ASSISTANCE Description: STG Maintain Skin Integrity With max Assistance. Outcome: Progressing Goal: RH STG ABLE TO PERFORM INCISION/WOUND CARE W/ASSISTANCE Description: STG Able To Perform Incision/Wound Care With max Assistance. Outcome: Progressing   Problem: RH SAFETY Goal: RH STG ADHERE TO SAFETY PRECAUTIONS W/ASSISTANCE/DEVICE Description: STG Adhere to Safety Precautions With supervision Assistance/Device. Outcome: Progressing   Problem: RH PAIN MANAGEMENT Goal: RH STG PAIN MANAGED AT OR BELOW PT'S PAIN GOAL Description: At or below level 4 Outcome: Progressing   Problem: RH KNOWLEDGE DEFICIT GENERAL Goal: RH STG INCREASE KNOWLEDGE OF SELF CARE AFTER HOSPITALIZATION Description: Family will be able to manage care with verbal direction of patient and using handouts/educational materials independently Outcome: Progressing   

## 2018-12-14 MED ORDER — FENTANYL 50 MCG/HR TD PT72
1.0000 | MEDICATED_PATCH | TRANSDERMAL | Status: DC
  Administered 2018-12-14: 1 via TRANSDERMAL
  Filled 2018-12-14: qty 1

## 2018-12-14 NOTE — Progress Notes (Addendum)
[facility] PHYSICAL MEDICINE & REHABILITATION PROGRESS NOTE   Subjective/Complaints: No new issues. Anxious about going home  ROS: Patient denies fever, rash, sore throat, blurred vision, nausea, vomiting, diarrhea, cough, shortness of breath or chest pain,  headache, or mood change.    Objective:   No results found. No results for input(s): WBC, HGB, HCT, PLT in the last 72 hours. Recent Labs    12/13/18 0626  NA 137  K 4.5  CL 103  CO2 25  GLUCOSE 98  BUN 22*  CREATININE 0.57*  CALCIUM 10.8*    Intake/Output Summary (Last 24 hours) at 12/14/2018 1125 Last data filed at 12/14/2018 0700 Gross per 24 hour  Intake 840 ml  Output 1650 ml  Net -810 ml     Physical Exam: Vital Signs Blood pressure 118/78, pulse 69, temperature 98.2 F (36.8 C), resp. rate 18, height 6\' 2"  (1.88 m), weight 69.8 kg, SpO2 97 %. Constitutional: No distress . Vital signs reviewed. HEENT: EOMI, oral membranes moist Neck: supple Cardiovascular: RRR without murmur. No JVD    Respiratory: CTA Bilaterally without wheezes or rales. Normal effort    GI: BS +, non-tender, non-distended , ostomy sealed Genitourinary: +SPC Musculoskeletal:  Comments: left knee rom 30-40 degrees--no further improvement  Neurological: He is alert and o x 3.  UE 3- to 3/5 deltoids, biceps, triceps, wrist/hands 3+/5. RLE 2/5 prox to distal. LLE with trace to 1-2/5 KE, HF, 0/5 otherwise without change. Sensory deficits LLE. RLE without obvious sensory findings. Intentional tremor bilateral UE ongoing Skin: He isnot diaphoretic. Healed grafts bilateral upper thighs. Back/buttock wounds not visualized Psychiatric:  pleasant    Assessment/Plan: 1. Functional deficits secondary to debility, polytrauma  which require 3+ hours per day of interdisciplinary therapy in a comprehensive inpatient rehab setting.  Physiatrist is providing close team supervision and 24 hour management of active medical problems listed  below.  Physiatrist and rehab team continue to assess barriers to discharge/monitor patient progress toward functional and medical goals  Care Tool:  Bathing    Body parts bathed by patient: Right arm, Left arm, Chest, Abdomen, Face, Right upper leg, Left upper leg   Body parts bathed by helper: Front perineal area, Buttocks, Right lower leg, Left lower leg Body parts n/a: Front perineal area, Buttocks, Right upper leg, Left upper leg, Right lower leg, Left lower leg(Did not complete LB this session)   Bathing assist Assist Level: Moderate Assistance - Patient 50 - 74%     Upper Body Dressing/Undressing Upper body dressing   What is the patient wearing?: Pull over shirt    Upper body assist Assist Level: Supervision/Verbal cueing    Lower Body Dressing/Undressing Lower body dressing    Lower body dressing activity did not occur: Safety/medical concerns What is the patient wearing?: Pants, Underwear/pull up     Lower body assist Assist for lower body dressing: Total Assistance - Patient < 25%     Toileting Toileting Toileting Activity did not occur Press photographer(Clothing management and hygiene only): N/A (no void or bm)(Suprapubic catheter and ostomy)  Toileting assist       Transfers Chair/bed transfer  Transfers assist  Chair/bed transfer activity did not occur: Safety/medical concerns  Chair/bed transfer assist level: Moderate Assistance - Patient 50 - 74%     Locomotion Ambulation   Ambulation assist   Ambulation activity did not occur: Safety/medical concerns  Assist level: 2 helpers Assistive device: Walker-platform Max distance: 12'   Walk 10 feet activity   Assist  Assist level: 2 helpers Assistive device: Walker-platform   Walk 50 feet activity   Assist Walk 50 feet with 2 turns activity did not occur: Safety/medical concerns         Walk 150 feet activity   Assist Walk 150 feet activity did not occur: Safety/medical concerns          Walk 10 feet on uneven surface  activity   Assist Walk 10 feet on uneven surfaces activity did not occur: Safety/medical concerns         Wheelchair     Assist Will patient use wheelchair at discharge?: Yes Type of Wheelchair: Power Wheelchair activity did not occur: Safety/medical concerns  Wheelchair assist level: Independent Max wheelchair distance: 150'    Wheelchair 50 feet with 2 turns activity    Assist        Assist Level: Independent   Wheelchair 150 feet activity     Assist Wheelchair 150 feet activity did not occur: Safety/medical concerns   Assist Level: Independent    Medical Problem List and Plan: 1.Functional and mobility deficitssecondary to major multiple trauma including numerous soft tissue injuries, pelvic fractures, and left sciatic nerve injury -dc home today  -Patient to see Rehab MD/provider in the office for transitional care encounter in 1-2 weeks.   -needs plastics, ortho, urology follow up as outpt 2. Antithrombotics: -DVT/anticoagulation:Pharmaceutical:Lovenox -antiplatelet therapy: N/A 3. Pain Management:Fentanyl 50 mcg/72 hrs with dilaudid prn for breakthrough pain.  -pain seems better overall  -continue current fentanyl patch given his multitude of ortho and plastics issues   -have requested home CPM unit to help progress his left knee ROM   extensive fractures appear stable, hardware in place and generally everything in alignment. No signs of HO in knee or hip.   -continue ROM exercises with PT. No CPM at this time 4. Mood:LCSW to follow for evaluation and support. Team to provide ego support. -antipsychotic agents: N/A  5. Neuropsych: This patientiscapable of making decisions on hisown behalf. 6.Multipledegloving injuries with necrosis/Skin/Wound Care:continue local care to ostomy and tube site. Dressings in place for back/buttock areas.  -WOC RN  follow up appreciated.   -  dressing changes to BID given increased output from wounds  -barrier cream to groin/scrotum/buttocks  - plastic surgery to follow up before he goes home, ongoing drainage from graft/wound site on buttock/low back -PRAFO's for bilateral heels      7. Fluids/Electrolytes/Nutrition: upgraded to regular diet!!  -continue megace for appetite can wean to off as outpt  -PEG removed 6/29. site closed  -continue supplements  - prealbumin up to 28 (continues to trend up)  -labs reviewed and look good 8. Acute on chronic respiratory failure: Decannulated. -stoma closed  -dc'ed mucinex  - continue flutter valve, IS 9.Recent SIRSdue to enterococcus DTO:IZTIWPYK 6/7--6/14 and macrodantin thorough 6/13 ---both completed 10.Urethral disruption/ SPCdependent: Monthly change and flush daily and prn to prevent blockage. 11. Anemia of chronic disease:  -iron panel c/w with ACD  -improve nutritional status  -hgb 10.7 12. Hypernatremia:  -improving   13. Tachycardia: Continue to monitor HR tid. Lopressor increased to 50 mg tid on 6/9 14.Encephalopathy:    -off provigil and amantadine 15. Anxiety disorder:Currently on multiple medications--  -tapered seroquel to 25mg  qhs---can continue at home  -continue  zoloft 25mg  daily      -klonopin prn for anxiety attacks  -mood improved 16.  Gastroparesis: reglan--weaned to off 17. Hypokalemia:    -follow up potassium 4.0, continue kdur but reduced to 41me  qd        Pt was seen additionally for a face to face encounter for a power wheelchair.   Mobility Assessment:  Mr. Christopher [name] was seen today for the purpose of a mobility assessment for a powered wheelchair. I have reviewed and agree with the detailed PT evaluation. 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 Due to his polytrauma, he is unable to utilize a cane, walker, manual wheelchair, or scooter. The  patient is appropriate for a customized power wheelchair.  With a power wheelchair  he can move independently at a household level and on a limited basis in the community. The chair will also allow the patient to perform ADL's more easily. The patient is competent to operate the recommended chair on his own and is motivated to utilize the chair on a daily basis.     Ranelle OysterZachary T. , MD, [facility] HospitalFAAPMR [facility]   LOS: 21 days [facility]AL[facility] WAS PERFOR[facility]  Ranelle OysterZachary T  12/14/2018, 11:25 AM

## 2018-12-14 NOTE — Discharge Instructions (Signed)
Inpatient Rehab Discharge Instructions  Christopher [name] Discharge date and time:  12/14/18  Activities/Precautions/ Functional Status: Activity: no lifting, driving, or strenuous exercise for till cleared by MD Diet: Soft foods. Offer high protein snacks between meals to help with protein stores.  Wound Care: Cleanse with normal saline. Pat dry and then cover with calcium alginate to absorb drainage. Change dressing twice a day to keep area dry.    Functional status:  ___ No restrictions     ___ Walk up steps independently _X__ 24/7 supervision/assistance   ___ Walk up steps with assistance ___ Intermittent supervision/assistance  ___ Bathe/dress independently ___ Walk with walker     __X_ Bathe/dress with assistance ___ Walk Independently    ___ Shower independently ___ Walk with assistance    ___ Shower with assistance _X__ No alcohol     ___ Return to work/school ________   COMMUNITY REFERRALS UPON DISCHARGE:   Home Health:   PT     OT     RN   Agency:  [facility] Phone:  [phone] Medical Equipment/Items Ordered:  Hospital bed with air alternating pressure pad; over the bed table; CPM; 36" slide board; platform rolling walker; power wheelchair loaner  Agency/Supplier:  [employer]     Phone:  [phone] Other:  [employer] for ambulance transport home and company to supply ostomy supplies, leg bags, etc as arranged by [employer].   Special Instructions: 1. Routine colostomy care.  2. Needs to perform Supra-pubic catheter care twice a day.  Supra-pubic catheter needs to be changed on 7/16.    My questions have been answered and I understand these instructions. I will adhere to these goals and the provided educational materials after my discharge from the hospital.  Patient/Caregiver Signature _______________________________ Date __________  Clinician Signature _______________________________________ Date __________  Please bring this form and  your medication list with you to all your follow-up doctor's appointments.

## 2018-12-14 NOTE — Progress Notes (Signed)
Pt d/c with medical supplies and discharge instructions. [facility] picking up and taking pt to home. [facility] unable to take RW with them and pt reports that will have partner pick up.

## 2018-12-17 DIAGNOSIS — E876 Hypokalemia: Secondary | ICD-10-CM

## 2018-12-17 DIAGNOSIS — F32A Depression, unspecified: Secondary | ICD-10-CM

## 2018-12-17 DIAGNOSIS — L24A9 Irritant contact dermatitis due friction or contact with other specified body fluids: Secondary | ICD-10-CM

## 2018-12-17 DIAGNOSIS — F329 Major depressive disorder, single episode, unspecified: Secondary | ICD-10-CM

## 2018-12-17 DIAGNOSIS — F339 Major depressive disorder, recurrent, unspecified: Secondary | ICD-10-CM

## 2018-12-17 DIAGNOSIS — T148XXA Other injury of unspecified body region, initial encounter: Secondary | ICD-10-CM

## 2018-12-17 DIAGNOSIS — F419 Anxiety disorder, unspecified: Secondary | ICD-10-CM

## 2018-12-17 DIAGNOSIS — E46 Unspecified protein-calorie malnutrition: Secondary | ICD-10-CM

## 2018-12-17 NOTE — Discharge Summary (Signed)
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 MG/5ML solution   Gerhardt's butt cream Crea Apply 1 application topically 2 (two) times daily.   HYDROcodone-acetaminophen 5-325 MG tablet--Rx #120 pills Commonly known as: NORCO/VICODIN Take 1 tablet by mouth every 4 (four) hours as needed for severe pain.   megestrol 400 MG/10ML suspension Commonly known as: MEGACE Take 10 mLs (400 mg total) by mouth 2 (two) times daily. Notes to patient: To help appetite    Melatonin 3 MG Tabs Take 1 tablet (3 mg total) by mouth at bedtime. Notes to patient: To help with sleep   methocarbamol 500 MG tablet Commonly known as: ROBAXIN Take 1 tablet (500 mg total) by mouth every 6 (six) hours as needed for muscle spasms. Notes to patient: For spasms   metoprolol tartrate 50 MG tablet Commonly known as: LOPRESSOR Take 1 tablet (50 mg total) by mouth every 8 (eight) hours. Replaces: metoprolol tartrate 25 mg/10 mL Susp   multivitamin with minerals Tabs tablet Take 1 tablet by mouth daily.   pantoprazole 40 MG tablet Commonly known as: PROTONIX Take 1 tablet (40 mg total) by mouth 2 (two) times daily. Replaces: pantoprazole sodium 40 mg/20 mL Pack Notes to patient: For stomach protection/reflux   polyethylene glycol 17  g packet Commonly known as: MIRALAX / GLYCOLAX  Take 17 g by mouth daily. What changed: how to take this Notes to patient: Laxative   potassium chloride SA 20 MEQ tablet Commonly known as: K-DUR Take 1 tablet (20 mEq total) by mouth daily.   prochlorperazine 5 MG tablet Commonly known as: COMPAZINE Take 1-2 tablets (5-10 mg total) by mouth every 6 (six) hours as needed for nausea.   QUEtiapine 25 MG tablet Commonly known as: SEROQUEL Take 1 tablet (25 mg total) by mouth at bedtime. What changed:   medication strength  how much to take  how to take this  when to take this Notes to patient: For anxiety and for sleep   senna-docusate 8.6-50 MG tablet Commonly known as: Senokot-S Take 2 tablets by mouth at bedtime. Notes to patient: For constipation   sertraline 25 MG tablet Commonly known as: ZOLOFT Take 1 tablet (25 mg total) by mouth daily. Notes to patient: For mood stabilization    traMADol 50 MG tablet--Rx # 90 pills Commonly known as: ULTRAM Take 1 tablet (50 mg total) by mouth every 6 (six) hours as needed for moderate pain.      Follow-up Information    Meredith Staggers, MD Follow up.   Specialty: Physical Medicine and Rehabilitation Why: Office will call you with follow up appointment Contact information: [address] [place] [address] [phone]        [name], DO. Call on 12/17/2018.   Specialty: Plastic Surgery Why: for post op appointment/Skin grafts Contact information: [address] [place] [address] [phone]        Georganna Skeans, MD Follow up on 12/17/2018.   Specialty: General Surgery Why: for post op appointment/GI issues-colone resection Contact information: [place] [address] [phone]        Franchot Gallo, MD. Call on 12/17/2018.   Specialty: Urology Why: for follow up on urological issues/SP cath Contact information: [place] [address] [phone]           Signed: Bary Leriche 12/17/2018, 5:51 PM

## 2018-12-18 ENCOUNTER — Telehealth: Payer: Self-pay | Admitting: *Deleted

## 2018-12-18 NOTE — Telephone Encounter (Signed)
Carrie RN called for SN frequency 2wk3, 1wk2 and prn visit. Approval given.   She is also asking a bout anti coag since he is not getting out of bed since discharge and he was on lovenox.  Please advise.

## 2018-12-19 NOTE — Telephone Encounter (Signed)
I spoke with Algis Liming and she said further anti coag is not necessary and his not moving is not new. I notified [employer].

## 2018-12-20 ENCOUNTER — Telehealth: Payer: Self-pay

## 2018-12-20 NOTE — Telephone Encounter (Signed)
Cheral [place] PT [facility] called requesting verbal orders for 1xwk X 1wk followed by 2xwk X 8wks.  Called him back and approved verbal orders.

## 2018-12-21 ENCOUNTER — Telehealth: Payer: Self-pay

## 2018-12-21 NOTE — Telephone Encounter (Signed)
Ronalee Belts OT [facility] called requesting verbal orders for OT for 1xwk X 4wks followed by 2xwk X 4wks.  Called him back and approved verbal orders.

## 2018-12-25 ENCOUNTER — Telehealth: Payer: Self-pay

## 2018-12-25 ENCOUNTER — Telehealth: Payer: Self-pay | Admitting: Plastic Surgery

## 2018-12-25 NOTE — Telephone Encounter (Signed)
Morey Hummingbird, RN from [facility] called stating patient has an increase in drainage and pain to his posterior left hip wound. Measuring larger and tunneling back 3.7 cm, afrebile. She is concerned about infection. Requesting antibiotic be called in. Also for an order for protein supplements to aid in wound healing, send to [facility].

## 2018-12-25 NOTE — Telephone Encounter (Signed)
Received call from [facility] from [facility] stating patient has increased purulent drainage and pain to his posterior left hip wound. She is concerned about infection and would like call back at [phone].

## 2018-12-25 NOTE — Telephone Encounter (Signed)
I spoke with Algis Liming and she said this needs to be referred to Dr Marla Roe. I will let [facility] Know.

## 2018-12-25 NOTE — Telephone Encounter (Signed)
Yes, he should have scheduled follow up with plastics

## 2018-12-26 MED ORDER — DOXYCYCLINE HYCLATE 100 MG PO TABS: 100.0000 mg | ORAL_TABLET | Freq: Two times a day (BID) | ORAL | 0 refills | Status: AC

## 2018-12-26 NOTE — Telephone Encounter (Signed)
Called Christopher [name] the patient's fiance back and informed her that I spoke with [facility] regarding the message below,and he will be calling in an antibiotic (Doxycycline) for the patient.  Christopher [name] verbalized understanding and agreed.//AB/CMA

## 2018-12-26 NOTE — Telephone Encounter (Signed)
Spoke with Angie, CMA in regards to Mr. Christopher [name]. His Fiance called today stating his wound on his hip appeared to be a little worse today than yesterday.   No fevers, chills per fiance. Will call in Antibiotic and see him Friday, 12/28/18

## 2018-12-26 NOTE — Telephone Encounter (Addendum)
Received call from [facility] the patient's fiance stating that the patient's wound has gotten worse and tunneling more since last night.  She stated that she's changing and cleaning the wound daily.  She was told to flush with normal saline, apply calcium alginate with ABD pads and taping it.  She stated that she was told by Morey Hummingbird with [place] on yesterday that we did not want to call in a antibiotic until the patient was seen on Friday.  But she feels the patient needs to be on a antibiotic before his appointment on Friday.  She don't want the wound to keep getting worse.  Asked [name] if the patient has a fever, and she stated that she checks his temp and he does not have a fever.  Informed [name] that I will speak with Dr. Marla Roe regarding her Wilma Flavin and give her a call back.  Melanie verbalized understanding and agreed.//AB/CMA

## 2018-12-27 ENCOUNTER — Telehealth: Payer: Self-pay | Admitting: Plastic Surgery

## 2018-12-27 ENCOUNTER — Encounter: Payer: No Typology Code available for payment source | Attending: Registered Nurse | Admitting: Registered Nurse

## 2018-12-27 ENCOUNTER — Other Ambulatory Visit: Payer: Self-pay

## 2018-12-27 ENCOUNTER — Encounter: Payer: Self-pay | Admitting: Registered Nurse

## 2018-12-27 VITALS — BP 94/55 | HR 65 | Temp 98.2°F | Resp 14 | Ht 75.0 in

## 2018-12-27 DIAGNOSIS — G894 Chronic pain syndrome: Secondary | ICD-10-CM | POA: Diagnosis present

## 2018-12-27 DIAGNOSIS — E46 Unspecified protein-calorie malnutrition: Secondary | ICD-10-CM | POA: Diagnosis present

## 2018-12-27 DIAGNOSIS — F329 Major depressive disorder, single episode, unspecified: Secondary | ICD-10-CM | POA: Diagnosis present

## 2018-12-27 DIAGNOSIS — S3730XD Unspecified injury of urethra, subsequent encounter: Secondary | ICD-10-CM | POA: Diagnosis not present

## 2018-12-27 DIAGNOSIS — L24A9 Irritant contact dermatitis due friction or contact with other specified body fluids: Secondary | ICD-10-CM

## 2018-12-27 DIAGNOSIS — T148XXA Other injury of unspecified body region, initial encounter: Secondary | ICD-10-CM | POA: Insufficient documentation

## 2018-12-27 DIAGNOSIS — F411 Generalized anxiety disorder: Secondary | ICD-10-CM | POA: Diagnosis present

## 2018-12-27 DIAGNOSIS — S32811B Multiple fractures of pelvis with unstable disruption of pelvic ring, initial encounter for open fracture: Secondary | ICD-10-CM | POA: Diagnosis present

## 2018-12-27 DIAGNOSIS — Z5181 Encounter for therapeutic drug level monitoring: Secondary | ICD-10-CM | POA: Diagnosis present

## 2018-12-27 DIAGNOSIS — R5381 Other malaise: Secondary | ICD-10-CM | POA: Diagnosis present

## 2018-12-27 DIAGNOSIS — E876 Hypokalemia: Secondary | ICD-10-CM | POA: Insufficient documentation

## 2018-12-27 DIAGNOSIS — T07XXXA Unspecified multiple injuries, initial encounter: Secondary | ICD-10-CM | POA: Diagnosis not present

## 2018-12-27 MED ORDER — POLYETHYLENE GLYCOL 3350 17 G PO PACK: 17.0000 g | PACK | Freq: Every day | ORAL | 1 refills | Status: DC

## 2018-12-27 MED ORDER — HYDROCODONE-ACETAMINOPHEN 5-325 MG PO TABS: 1.0000 | ORAL_TABLET | ORAL | 0 refills | Status: DC | PRN

## 2018-12-27 MED ORDER — CLONAZEPAM 0.5 MG PO TABS: 0.2500 mg | ORAL_TABLET | Freq: Three times a day (TID) | ORAL | 1 refills | Status: DC | PRN

## 2018-12-27 MED ORDER — FENTANYL 50 MCG/HR TD PT72: 1.0000 | MEDICATED_PATCH | TRANSDERMAL | 0 refills | Status: DC

## 2018-12-27 NOTE — Telephone Encounter (Signed)
Called patient to confirm appointment scheduled for tomorrow. Patient's fiance answered the following questions: 1. To the best of your knowledge, have you been in close contact with any one with a confirmed diagnosis of COVID-19? No 2. Have you had any one or more of the following; fever, chills, cough, shortness of breath, or any flu-like symptoms? No 3. Have you been diagnosed with or have a previous diagnosis of COVID 19? No 4. I am going to go over a few other symptoms with you. Please let me know if you are experiencing any of the following: None of the below a. Ear, nose, or throat discomfort b. A sore throat c. Headache d. Muscle pain e. Diarrhea f. Loss of taste or smell
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
## 2018-12-28 ENCOUNTER — Ambulatory Visit (INDEPENDENT_AMBULATORY_CARE_PROVIDER_SITE_OTHER): Payer: No Typology Code available for payment source | Admitting: Surgical

## 2018-12-28 ENCOUNTER — Encounter: Payer: Self-pay | Admitting: Surgical

## 2018-12-28 ENCOUNTER — Telehealth: Payer: Self-pay | Admitting: *Deleted

## 2018-12-28 VITALS — BP 83/51 | HR 80 | Temp 98.4°F

## 2018-12-28 DIAGNOSIS — S31000A Unspecified open wound of lower back and pelvis without penetration into retroperitoneum, initial encounter: Secondary | ICD-10-CM | POA: Insufficient documentation

## 2018-12-28 DIAGNOSIS — S31809A Unspecified open wound of unspecified buttock, initial encounter: Secondary | ICD-10-CM | POA: Insufficient documentation
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
## 2018-12-28 NOTE — Telephone Encounter (Signed)
Marlowe Kays PT with [facility] called because she noticed wheezing particularly in Mr Jesusita Oka lower lobes.  Please advise.  I spoke with Zella Ball and she saw him yesterday and did not hear anything when she listened.  He will need to go to the ED to be evaluated or call his primary.

## 2019-01-01 LAB — DRUG TOX ALC METAB W/CON, ORAL FLD: Alcohol Metabolite: NEGATIVE ng/mL (ref <25)

## 2019-01-01 LAB — DRUG TOX MONITOR 1 W/CONF, ORAL FLD
Amphetamines: NEGATIVE ng/mL (ref <10)
Barbiturates: NEGATIVE ng/mL (ref <10)
Benzodiazepines: NEGATIVE ng/mL (ref <0.50)
Buprenorphine: NEGATIVE ng/mL (ref <0.10)
Cocaine: NEGATIVE ng/mL (ref <5.0)
Codeine: NEGATIVE ng/mL (ref <2.5)
Dihydrocodeine: NEGATIVE ng/mL (ref <2.5)
Fentanyl: 0.55 ng/mL — ABNORMAL HIGH (ref <0.10)
Fentanyl: POSITIVE ng/mL — AB (ref <0.10)
Heroin Metabolite: NEGATIVE ng/mL (ref <1.0)
Hydrocodone: 16.4 ng/mL — ABNORMAL HIGH (ref <2.5)
Hydromorphone: NEGATIVE ng/mL (ref <2.5)
MARIJUANA: NEGATIVE ng/mL (ref <2.5)
MDMA: NEGATIVE ng/mL (ref <10)
Meprobamate: NEGATIVE ng/mL (ref <2.5)
Methadone: NEGATIVE ng/mL (ref <5.0)
Morphine: NEGATIVE ng/mL (ref <2.5)
Nicotine Metabolite: NEGATIVE ng/mL (ref <5.0)
Norhydrocodone: NEGATIVE ng/mL (ref <2.5)
Noroxycodone: NEGATIVE ng/mL (ref <2.5)
Opiates: POSITIVE ng/mL — AB (ref <2.5)
Oxycodone: NEGATIVE ng/mL (ref <2.5)
Oxymorphone: NEGATIVE ng/mL (ref <2.5)
Phencyclidine: NEGATIVE ng/mL (ref <10)
Tapentadol: NEGATIVE ng/mL (ref <5.0)
Tramadol: 143.9 ng/mL — ABNORMAL HIGH (ref <5.0)
Tramadol: POSITIVE ng/mL — AB (ref <5.0)
Zolpidem: NEGATIVE ng/mL (ref <5.0)

## 2019-01-02 ENCOUNTER — Telehealth: Payer: Self-pay | Admitting: *Deleted

## 2019-01-02 NOTE — Telephone Encounter (Signed)
Oral swab drug screen was consistent for prescribed medications.  ?

## 2019-01-04 ENCOUNTER — Other Ambulatory Visit: Payer: Self-pay | Admitting: Physical Medicine and Rehabilitation

## 2019-01-04 ENCOUNTER — Encounter: Payer: Self-pay | Admitting: Physical Medicine & Rehabilitation

## 2019-01-07 NOTE — Telephone Encounter (Signed)
Message from patients fiance.

## 2019-01-08 ENCOUNTER — Telehealth: Payer: Self-pay | Admitting: Registered Nurse

## 2019-01-08 MED ORDER — OXYCODONE HCL 5 MG PO TABS: 5.0000 mg | ORAL_TABLET | Freq: Every day | ORAL | 0 refills | Status: DC | PRN

## 2019-01-08 NOTE — Telephone Encounter (Signed)
Spoke with Dr. Naaman Plummer this morning regarding Mr. Shaler , Mr. Kienle pain is in his lower back and left hip. His girlfriend [name] states Mr. Dumond only receiving 2 hours of pain relief. On his allergie list it has Oxycodone as an allergy, [name] states Mr. Hochstatter has taken percocet in the past, she also reports he was having some nausea and vomiting in the hospital, they felt it may be the oxycodone. This was discussed with Dr. Naaman Plummer he is in agreement with changing hydrocodone to oxycodone, Fentany dosage will remain the same We will prescribe the Oxycodone. PMP was reviewed Hydrocodone was filled on 01/04/2019 he has 142 tablets today. Ms. [name] was asked to send a My-chart message once she picks up the Oxycodone. She will bring the hydrocodone to office to discard on his next schedule appointment,she verbalizes understanding.  Also instructed to make sure Mr. Gamino eats prior to taking oxycodone, she verbalizes understanding.   Placed a call to [place] and spoke with Der the pharmacist regarding Oxycodone, she verbalizes understanding.

## 2019-01-08 NOTE — Telephone Encounter (Signed)
Message from patient

## 2019-01-09 ENCOUNTER — Telehealth: Payer: Self-pay

## 2019-01-09 NOTE — Telephone Encounter (Signed)
Santiago Glad RN [facility] called, stated that on her visit this morning that the patient reported severe pain at his suprapubic catheter site.  Requesting a call back wanting to know what needs to happen for this patient for that pain.

## 2019-01-09 NOTE — Telephone Encounter (Signed)
Called and relayed information to [facility]

## 2019-01-09 NOTE — Telephone Encounter (Signed)
Would collect sample of urine for UA, UCX  The catheter needs to be flushed to make sure his bladder is emptying.   Is catheter insertion site clean? Andy signs of infection there?  Needs to be seen by his urologist

## 2019-01-10 ENCOUNTER — Other Ambulatory Visit: Payer: Self-pay

## 2019-01-10 ENCOUNTER — Encounter: Payer: Self-pay | Admitting: Plastic Surgery

## 2019-01-10 ENCOUNTER — Ambulatory Visit (INDEPENDENT_AMBULATORY_CARE_PROVIDER_SITE_OTHER): Payer: No Typology Code available for payment source | Admitting: Plastic Surgery

## 2019-01-10 VITALS — BP 84/46 | HR 71 | Temp 97.0°F

## 2019-01-10 DIAGNOSIS — S31809A Unspecified open wound of unspecified buttock, initial encounter: Secondary | ICD-10-CM

## 2019-01-10 NOTE — Progress Notes (Signed)
The patient is a 53 year old male here for follow-up on his multiple lower extremity and gluteal wounds.  He is doing well overall he is at home.  He is starting to do some more walking and sitting.  He is trying to be very cautious about not sitting on his sacral area for too long.  All the wounds are improving and getting better including the left iliac crest and the sacral area.  His pain seems to be a little bit better today as well.  He is accompanied by his fiance who is very supportive.  Today he told me he has been able to spend some time with his son spend a lot of time talking and watching a movie.  I like to see him back in 3 to 4 weeks.  We did get some pictures today.

## 2019-01-14 ENCOUNTER — Encounter: Payer: Self-pay | Admitting: Family Medicine

## 2019-01-14 ENCOUNTER — Telehealth: Payer: Self-pay

## 2019-01-14 ENCOUNTER — Ambulatory Visit (INDEPENDENT_AMBULATORY_CARE_PROVIDER_SITE_OTHER): Payer: BC Managed Care – PPO | Admitting: Family Medicine

## 2019-01-14 DIAGNOSIS — S3730XD Unspecified injury of urethra, subsequent encounter: Secondary | ICD-10-CM

## 2019-01-14 DIAGNOSIS — T07XXXD Unspecified multiple injuries, subsequent encounter: Secondary | ICD-10-CM | POA: Diagnosis not present

## 2019-01-14 DIAGNOSIS — Z79899 Other long term (current) drug therapy: Secondary | ICD-10-CM

## 2019-01-14 DIAGNOSIS — S31809D Unspecified open wound of unspecified buttock, subsequent encounter: Secondary | ICD-10-CM | POA: Diagnosis not present

## 2019-01-14 DIAGNOSIS — R000 Tachycardia, unspecified: Secondary | ICD-10-CM

## 2019-01-14 DIAGNOSIS — F419 Anxiety disorder, unspecified: Secondary | ICD-10-CM | POA: Diagnosis not present

## 2019-01-14 DIAGNOSIS — S31809A Unspecified open wound of unspecified buttock, initial encounter: Secondary | ICD-10-CM

## 2019-01-14 DIAGNOSIS — F32A Depression, unspecified: Secondary | ICD-10-CM

## 2019-01-14 DIAGNOSIS — F329 Major depressive disorder, single episode, unspecified: Secondary | ICD-10-CM

## 2019-01-14 DIAGNOSIS — Z933 Colostomy status: Secondary | ICD-10-CM

## 2019-01-14 DIAGNOSIS — T07XXXA Unspecified multiple injuries, initial encounter: Secondary | ICD-10-CM

## 2019-01-14 DIAGNOSIS — G4700 Insomnia, unspecified: Secondary | ICD-10-CM

## 2019-01-14 DIAGNOSIS — G894 Chronic pain syndrome: Secondary | ICD-10-CM | POA: Diagnosis not present

## 2019-01-14 NOTE — Assessment & Plan Note (Signed)
Continue Klonopin 0.25 mg 3 times daily and Zoloft 25 mg daily.  Consider increasing dose of Zoloft in the near future.
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
## 2019-01-14 NOTE — Assessment & Plan Note (Signed)
Patient not sure why he is on this.  Would likely use this stress ulcer prophylaxis while in ICU.  Will continue for the time being however will try to come off of it in the near future.

## 2019-01-14 NOTE — Telephone Encounter (Signed)
Called and related Dr. Naaman Plummer message to [place].

## 2019-01-14 NOTE — Telephone Encounter (Signed)
Encourage activity and OOB as much as possible as well as incentive spirometry.  If his breathing worsens or if he develops any fever, he needs to be seen by an MD.

## 2019-01-14 NOTE — Assessment & Plan Note (Signed)
Stable.  Continue management per urology. 

## 2019-01-14 NOTE — Assessment & Plan Note (Signed)
Decrease metoprolol to 50mg  twice daily.  Advised to keep an eye on heart rate and let me know if outside ranges of 60-100.  Discussed potential rebound effect.  We will continue to wean off as tolerated.

## 2019-01-14 NOTE — Assessment & Plan Note (Signed)
Continue management per PM&R

## 2019-01-14 NOTE — Assessment & Plan Note (Signed)
Continue fentanyl and oxycodone per PM&R.

## 2019-01-14 NOTE — Assessment & Plan Note (Signed)
Continue melatonin 3 mg daily and Seroquel 25 mg nightly.

## 2019-01-14 NOTE — Assessment & Plan Note (Signed)
Continue management per plastic surgery.

## 2019-01-14 NOTE — Telephone Encounter (Signed)
Marlowe Kays, PT from [facility] called to report that patient has some wheezing in left lung and coughing up clear to white phlegm.

## 2019-01-14 NOTE — Assessment & Plan Note (Signed)
Continue management per surgery. 

## 2019-01-15 ENCOUNTER — Telehealth: Payer: Self-pay

## 2019-01-15 ENCOUNTER — Other Ambulatory Visit: Payer: Self-pay | Admitting: Physical Medicine and Rehabilitation

## 2019-01-15 NOTE — Telephone Encounter (Signed)
Morey Hummingbird, RN/[facility] called requesting verbal orders to continue to see patient HHRN 1wk4. Orders approved and given.

## 2019-01-16 ENCOUNTER — Telehealth: Payer: Self-pay | Admitting: Registered Nurse

## 2019-01-16 MED ORDER — POLYETHYLENE GLYCOL 3350 17 GM/SCOOP PO POWD: ORAL | 2 refills | Status: DC

## 2019-01-16 NOTE — Telephone Encounter (Signed)
Placed a call to Ms. [name],  ( Mr. Ryler Laskowski) she reports, Mr. Kroon pain is controlled for the most part with current medication regimen. She has noticed increase intensity of pain the day prior to Fentanyl patch needing to be change. We discussed Xtampza again, also spoke with Dr. Naaman Plummer. He is in agreement with medication change. [name] will speak with Mr. Mcquarrie and send a my chart message in the morning with his decision.

## 2019-01-18 ENCOUNTER — Telehealth: Payer: Self-pay | Admitting: Registered Nurse

## 2019-01-18 MED ORDER — XTAMPZA ER 18 MG PO C12A: 1.0000 | EXTENDED_RELEASE_CAPSULE | Freq: Two times a day (BID) | ORAL | 0 refills | Status: DC

## 2019-01-18 NOTE — Telephone Encounter (Signed)
Placed a call to [facility] Ms. Creque fiance, regarding analgesics. No answer. We will prescribe Xtampza 18 mg every 12 hours, Fentanyl discontinue. The above discussed with Dr. Naaman Plummer, he agrees with plan. Placed a call to [facility], no answer, awaiting her call regarding the above. Xtampza prescription, e-scribed.

## 2019-01-21 NOTE — Telephone Encounter (Signed)
Message concerning patient 

## 2019-01-29 ENCOUNTER — Encounter: Payer: Self-pay | Admitting: Physical Medicine and Rehabilitation

## 2019-01-29 ENCOUNTER — Encounter
Payer: No Typology Code available for payment source | Attending: Registered Nurse | Admitting: Physical Medicine and Rehabilitation

## 2019-01-29 ENCOUNTER — Other Ambulatory Visit: Payer: Self-pay

## 2019-01-29 VITALS — BP 68/40 | HR 64 | Resp 22

## 2019-01-29 DIAGNOSIS — S32811B Multiple fractures of pelvis with unstable disruption of pelvic ring, initial encounter for open fracture: Secondary | ICD-10-CM | POA: Insufficient documentation

## 2019-01-29 DIAGNOSIS — G8921 Chronic pain due to trauma: Secondary | ICD-10-CM | POA: Diagnosis not present

## 2019-01-29 DIAGNOSIS — T07XXXA Unspecified multiple injuries, initial encounter: Secondary | ICD-10-CM | POA: Insufficient documentation

## 2019-01-29 DIAGNOSIS — T148XXA Other injury of unspecified body region, initial encounter: Secondary | ICD-10-CM | POA: Diagnosis present

## 2019-01-29 DIAGNOSIS — F411 Generalized anxiety disorder: Secondary | ICD-10-CM | POA: Insufficient documentation

## 2019-01-29 DIAGNOSIS — R5381 Other malaise: Secondary | ICD-10-CM | POA: Insufficient documentation

## 2019-01-29 DIAGNOSIS — S3730XD Unspecified injury of urethra, subsequent encounter: Secondary | ICD-10-CM | POA: Insufficient documentation

## 2019-01-29 DIAGNOSIS — G894 Chronic pain syndrome: Secondary | ICD-10-CM | POA: Diagnosis present

## 2019-01-29 DIAGNOSIS — Z5181 Encounter for therapeutic drug level monitoring: Secondary | ICD-10-CM | POA: Insufficient documentation

## 2019-01-29 DIAGNOSIS — E876 Hypokalemia: Secondary | ICD-10-CM | POA: Insufficient documentation

## 2019-01-29 DIAGNOSIS — M792 Neuralgia and neuritis, unspecified: Secondary | ICD-10-CM

## 2019-01-29 DIAGNOSIS — F064 Anxiety 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: Secondary | ICD-10-CM

## 2019-01-29 DIAGNOSIS — E46 Unspecified protein-calorie malnutrition: Secondary | ICD-10-CM | POA: Insufficient documentation

## 2019-01-29 DIAGNOSIS — F329 Major depressive disorder, single episode, unspecified: Secondary | ICD-10-CM | POA: Insufficient documentation

## 2019-01-29 MED ORDER — CITALOPRAM HYDROBROMIDE 20 MG PO TABS: 20.0000 mg | ORAL_TABLET | Freq: Every day | ORAL | 2 refills | Status: DC

## 2019-01-29 MED ORDER — OXYCODONE-ACETAMINOPHEN 5-325 MG PO TABS: 1.0000 | ORAL_TABLET | ORAL | 0 refills | Status: DC | PRN

## 2019-01-29 MED ORDER — FENTANYL 50 MCG/HR TD PT72: 1.0000 | MEDICATED_PATCH | TRANSDERMAL | 0 refills | Status: DC
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
## 2019-01-29 NOTE — Addendum Note (Signed)
Addended by: Courtney Heys on: 01/29/2019 10:26 AM   Modules accepted: Orders
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
## 2019-01-30 DIAGNOSIS — Z433 Encounter for attention to colostomy: Secondary | ICD-10-CM

## 2019-01-30 DIAGNOSIS — Z466 Encounter for fitting and adjustment of urinary device: Secondary | ICD-10-CM

## 2019-01-30 DIAGNOSIS — S81802D Unspecified open wound, left lower leg, subsequent encounter: Secondary | ICD-10-CM

## 2019-01-30 DIAGNOSIS — S32811D Multiple fractures of pelvis with unstable disruption of pelvic ring, subsequent encounter for fracture with routine healing: Secondary | ICD-10-CM

## 2019-01-30 DIAGNOSIS — F419 Anxiety disorder, unspecified: Secondary | ICD-10-CM

## 2019-01-30 DIAGNOSIS — G894 Chronic pain syndrome: Secondary | ICD-10-CM

## 2019-01-30 DIAGNOSIS — Z435 Encounter for attention to cystostomy: Secondary | ICD-10-CM

## 2019-01-30 DIAGNOSIS — J9611 Chronic respiratory failure with hypoxia: Secondary | ICD-10-CM

## 2019-01-30 DIAGNOSIS — S381XXD Crushing injury of abdomen, lower back, and pelvis, subsequent encounter: Secondary | ICD-10-CM

## 2019-01-30 DIAGNOSIS — S31819D Unspecified open wound of right buttock, subsequent encounter: Secondary | ICD-10-CM | POA: Diagnosis not present

## 2019-01-30 DIAGNOSIS — Z993 Dependence on wheelchair: Secondary | ICD-10-CM

## 2019-01-30 DIAGNOSIS — Z945 Skin transplant status: Secondary | ICD-10-CM

## 2019-01-30 DIAGNOSIS — S31000D Unspecified open wound of lower back and pelvis without penetration into retroperitoneum, subsequent encounter: Secondary | ICD-10-CM

## 2019-01-30 DIAGNOSIS — Z87891 Personal history of nicotine dependence: Secondary | ICD-10-CM

## 2019-01-30 DIAGNOSIS — S3802XD Crushing injury of scrotum and testis, subsequent encounter: Secondary | ICD-10-CM

## 2019-01-30 DIAGNOSIS — D638 Anemia in other chronic diseases classified elsewhere: Secondary | ICD-10-CM

## 2019-01-30 DIAGNOSIS — R1310 Dysphagia, unspecified: Secondary | ICD-10-CM

## 2019-02-01 ENCOUNTER — Ambulatory Visit: Payer: BC Managed Care – PPO | Admitting: Surgical

## 2019-02-01 ENCOUNTER — Other Ambulatory Visit: Payer: Self-pay | Admitting: Physical Medicine and Rehabilitation

## 2019-02-01 NOTE — Telephone Encounter (Signed)
Recieved electronic medication refill request for robaxin.  No mention of this medication in last 2 notes.  Unsure if ok to refill medication.  Please advise.

## 2019-02-01 NOTE — Telephone Encounter (Signed)
Can refill it- anything to make that guy hurt less.

## 2019-02-05 ENCOUNTER — Encounter: Payer: Self-pay | Admitting: Surgical

## 2019-02-05 ENCOUNTER — Ambulatory Visit (INDEPENDENT_AMBULATORY_CARE_PROVIDER_SITE_OTHER): Payer: BC Managed Care – PPO | Admitting: Surgical

## 2019-02-05 ENCOUNTER — Other Ambulatory Visit: Payer: Self-pay

## 2019-02-05 DIAGNOSIS — S31809D Unspecified open wound of unspecified buttock, subsequent encounter: Secondary | ICD-10-CM

## 2019-02-05 DIAGNOSIS — S31000A Unspecified open wound of lower back and pelvis without penetration into retroperitoneum, initial encounter: Secondary | ICD-10-CM

## 2019-02-05 NOTE — Progress Notes (Signed)
   Subjective:     Patient ID: Christopher [name], male    DOB: 10/31/1965, 53 y.o.   MRN: [id number]  Chief Complaint  Patient presents with  . Follow-up    tele-visit     HPI: The patient is a 52 y.o. male here for a televisit follow up for his multiple extremity and gluteal wound. His fiance and wound care nurse are present during the visit.  His fiance reports he has been doing well overall. His sacral wound has epithelialized and is closed. The wound on his left hip appears to be hypergranulated and per nursing and fiance there is a small pustule at the posterior most portion. Visualization on telecall is limited. Small amount of purulent drainage expressed per wound care nurse. Does not appear overtly infected.  His groin wound per nursing is beefy red and appears to be granulating. No sign of infection. This wound is causing him the most pain.  No fevers, chills. He has been working with PT to regain strength. He has been doing well per fiance, some days better than others. Eating well most days, depending on pain level.   No new sores per fiance. He has been able to sit in wheelchair and is adjusting to his new air mattress bed.  Review of Systems  Constitutional: Positive for weight loss. Negative for chills, diaphoresis, fever and malaise/fatigue.  Respiratory: Negative.   Cardiovascular: Negative.   Gastrointestinal: Negative.   Genitourinary: Negative.   Musculoskeletal: Positive for myalgias.  Skin: Positive for rash. Negative for itching.  Neurological: Positive for sensory change and weakness.     Objective:   Vital Signs There were no vitals taken for this visit.  Exam limited due to tele-visit.  Groin wound - unable to visualize. Per nursing: Beefy red with no surrounding erythema. Left hip wound - granulating, potentially hypergranulated with inferior/posterior pustule - minimal purulent drainage expressed. No surrounding erythema. Sacral wound -  epithelialized per fiance.  Physical Exam  Constitutional: He is oriented to person, place, and time.  thin  Musculoskeletal:        General: No edema.  Neurological: He is alert and oriented to person, place, and time.  Skin: Skin is warm and dry. No rash noted. There is erythema (erythema of posterior buttocks/thigh due to pressure.).  Psychiatric: Mood and affect normal.    Assessment/Plan:     ICD-10-CM   1. Wound of buttock, unspecified laterality, subsequent encounter  S31.809D   2. Sacral wound, initial encounter  S31.000A     Left hip/buttock wound is granulating. Small pustule/vesicle on posterior portion. Minimal drainage expressed. - Pack with gauze packing and remove daily. Allow pustule to drain on own. Call if it worsens or does not improve prior to 2 week visit in office.  Sacral wound has improved and is no longer open per fiance.   Groin wound - improving, granulating. Continue with current wound care regimen.  Follow up in office in 2 weeks. Cal with any questions or concerns. Carola Rhine Barnett Elzey, PA-C 02/05/2019, 12:21 PM

## 2019-02-06 ENCOUNTER — Encounter: Payer: Self-pay | Admitting: Plastic Surgery

## 2019-02-08 ENCOUNTER — Other Ambulatory Visit: Payer: Self-pay | Admitting: Registered Nurse

## 2019-02-08 ENCOUNTER — Telehealth: Payer: Self-pay | Admitting: *Deleted

## 2019-02-08 ENCOUNTER — Other Ambulatory Visit: Payer: Self-pay | Admitting: Physical Medicine and Rehabilitation

## 2019-02-08 NOTE — Telephone Encounter (Signed)
CM called [facility]. THis is actually because of multiple meds that he has been on , they needed clarification that he was not on xtampza fentanyl and the oxy-acetaminophen.  I clarified that he is only on the Fentanyl and Oxycodone Acetaminophen now and his dx code was G89.21.

## 2019-02-08 NOTE — Telephone Encounter (Signed)
Mr Owens Shark sent MyChart message saying he needed a prior auth on medication Oxycodone Acetaminophen 5/325 #150 sent to his CM Maudry Diego with his workman's comp to get approved.

## 2019-02-12 ENCOUNTER — Telehealth: Payer: Self-pay | Admitting: Physical Medicine and Rehabilitation

## 2019-02-12 NOTE — Telephone Encounter (Signed)
Coralyn Mark RN with [facility] needs a call back about patient.  Patient having wound care issues, BP was 90/62, HR 66 and Stats 97.  Coralyn Mark is questioning if patient needs a chest x ray to rule out pneumonia.  She also has questions about meds.  Please call her at [phone].  (Some of her message I couldn't understand because of her cell phone kept going in and out during message).

## 2019-02-13 NOTE — Telephone Encounter (Signed)
I called [facility], the home health agency- they mainly were asking for CXR- I explained, although hasn't talked to PCP much, that I'm not the PCP- I'm a rehab doctor - she voiced understanding, and will call PCP.  Otherwise, vitals were not concerning for pneumonia, but if doesn't get up/out of bed, might get pneumonia form lack of deep breathing.  The wound care was to be taken care of by Plastics- they were aware.  She was OK with plan to call PCP if she was still concerned.

## 2019-02-14 ENCOUNTER — Encounter: Payer: Self-pay | Admitting: Physical Medicine and Rehabilitation

## 2019-02-14 NOTE — Telephone Encounter (Signed)
Called pt's finance's- should not be taking clonidine anymore- can come off it- is a blood pressure medicine and his BP is still really low according to her.  Also, if still needs potassium, needs to get from PCP.- She voiced understanding.

## 2019-02-18 ENCOUNTER — Telehealth: Payer: BC Managed Care – PPO | Admitting: Nurse Practitioner

## 2019-02-18 DIAGNOSIS — N3000 Acute cystitis without hematuria: Secondary | ICD-10-CM | POA: Diagnosis not present

## 2019-02-18 MED ORDER — CEPHALEXIN 500 MG PO CAPS: 500.0000 mg | ORAL_CAPSULE | Freq: Two times a day (BID) | ORAL | 0 refills | Status: DC

## 2019-02-18 NOTE — Progress Notes (Signed)
We are sorry that you are not feeling well.  Here is how we plan to help!  Based on what you shared with me it looks like you most likely have a simple urinary tract infection.  A UTI (Urinary Tract Infection) is a bacterial infection of the bladder.  Most cases of urinary tract infections are simple to treat but a key part of your care is to encourage you to drink plenty of fluids and watch your symptoms carefully.  I have prescribed Keflex 500 mg twice a day for 7 days.  Your symptoms should gradually improve. Call us if the burning in your urine worsens, you develop worsening fever, back pain or pelvic pain or if your symptoms do not resolve after completing the antibiotic.  Urinary tract infections can be prevented by drinking plenty of water to keep your body hydrated.  Also be sure when you wipe, wipe from front to back and don't hold it in!  If possible, empty your bladder every 4 hours.  Your e-visit answers were reviewed by a board certified advanced clinical practitioner to complete your personal care plan.  Depending on the condition, your plan could have included both over the counter or prescription medications.  If there is a problem please reply  once you have received a response from your provider.  Your safety is important to us.  If you have drug allergies check your prescription carefully.    You can use MyChart to ask questions about today's visit, request a non-urgent call back, or ask for a work or school excuse for 24 hours related to this e-Visit. If it has been greater than 24 hours you will need to follow up with your provider, or enter a new e-Visit to address those concerns.   You will get an e-mail in the next two days asking about your experience.  I hope that your e-visit has been valuable and will speed your recovery. Thank you for using e-visits.   5-10 minutes spent reviewing and documenting in chart.  

## 2019-02-19 ENCOUNTER — Telehealth: Payer: Self-pay | Admitting: Family Medicine

## 2019-02-19 ENCOUNTER — Telehealth: Payer: Self-pay

## 2019-02-19 ENCOUNTER — Other Ambulatory Visit: Payer: Self-pay | Admitting: Physical Medicine and Rehabilitation

## 2019-02-19 ENCOUNTER — Ambulatory Visit: Payer: BC Managed Care – PPO | Admitting: Surgical

## 2019-02-19 ENCOUNTER — Other Ambulatory Visit: Payer: Self-pay

## 2019-02-19 DIAGNOSIS — G8921 Chronic pain due to trauma: Secondary | ICD-10-CM

## 2019-02-19 DIAGNOSIS — T1490XA Injury, unspecified, initial encounter: Secondary | ICD-10-CM

## 2019-02-19 MED ORDER — FENTANYL 50 MCG/HR TD PT72: 1.0000 | MEDICATED_PATCH | TRANSDERMAL | 0 refills | Status: DC

## 2019-02-19 MED ORDER — FENTANYL 50 MCG/HR TD PT72: 1.0000 | MEDICATED_PATCH | TRANSDERMAL | 0 refills | Status: AC

## 2019-02-19 NOTE — Telephone Encounter (Signed)
Reordered medication due to technical issues.

## 2019-02-19 NOTE — Telephone Encounter (Signed)
Patient had e-visit for this issue on 02/18/2019 (date of call) and was prescribed antibiotics.

## 2019-02-19 NOTE — Progress Notes (Signed)
Pt's Rx was accidentally last filled for 5 patches, so 10 days- need to refill x 30 days- sending in today. It's due and appropriate

## 2019-02-19 NOTE — Telephone Encounter (Signed)
Zenia Resides PT [facility] calling for verbal orders for PT = 1wk 1, 2wk 7             OT = 2wk 8 Stated that they have noticed marked improvement in him, slow and steady improvement but improvement none the less  Called him back and approved verbal orders

## 2019-02-19 NOTE — Telephone Encounter (Signed)
[place] [facility] [place] RECORD [facility] Patient Name: Christopher [name] Gender: Male DOB: Aug 25, 1965 Age: 53 Y 39 M 22 D Return Phone Number: [phone] (Primary) Address: City/State/Zip: [place] [address] Client [facility] [place] Client Site [place] [facility] [place] Night Physician Dimas Chyle- MD Contact Type Call Who Is Calling Patient / Member / Family / Caregiver Call Type Triage / Clinical Caller Name [name] Relationship To Patient Care Giver Return Phone Number [phone] (Primary) Chief Complaint Urine - unusual color Reason for Call Symptomatic / Request for [place] has been in hospital for 7 months and recently released, still has wounds and has a temp of 100.3 and dark urine. [place] Not Listed UC Translation No Nurse Assessment Nurse: Rodney Cruise, RN, Hilliard Clark Date/Time (Eastern Time): 02/18/2019 9:42:47 AM Confirm and document reason for call. If symptomatic, describe symptoms. ---Caller states client overnight was emptying catheter, fever of 100.3, Tylenol about 40 minutes ago, wounds on left hip and between legs are not swelling or draining anymore, no redness around wounds either, has not had UTI since got home, urine is darker than normal this morning, denies difficulty breathing - yesterday was hacking up clear sticky stuff. Has the patient had close contact with a person known or suspected to have the novel coronavirus illness OR traveled / lives in area with major community spread (including international travel) in the last 14 days from the onset of symptoms? * If Asymptomatic, screen for exposure and travel within the last 14 days. ---No Does the patient have any new or worsening symptoms? ---Yes Will a triage be completed? ---Yes Related visit to physician within the last 2 weeks? ---Yes Does the PT have any chronic  conditions? (i.e. diabetes, asthma, this includes High risk factors for pregnancy, etc.) ---No Is this a behavioral health or substance abuse call? ---No PLEASE NOTE: All timestamps contained within this report are represented as [place] Standard Time. CONFIDENTIALTY NOTICE: This fax transmission is intended only for the addressee. It contains information that is legally privileged, confidential or otherwise protected from use or disclosure. If you are not the intended recipient, you are strictly prohibited from reviewing, disclosing, copying using or disseminating any of this information or taking any action in reliance on or regarding this information. If you have received this fax in error, please notify us immediately by telephone so that we can arrange for its return to [place]. Phone: [phone], Toll-Free: [phone], Fax: [phone] Page: 2 of 2 Call Id: [id number] Guidelines Guideline Title Affirmed Question Affirmed Notes Nurse Date/Time Eilene Ghazi Time) Fever [1] Fever > 100.0 F (37.8 C) AND [2] bedridden (e.g., nursing home patient, CVA, chronic illness, recovering from surgery) Baxter, RN, Hilliard Clark 02/18/2019 9:46:54 AM Disp. Time Eilene Ghazi Time) Disposition Final User 02/18/2019 9:56:58 AM See HCP within 4 Hours (or PCP triage) Yes Baxter, RN, Lillia Dallas Disagree/Comply Comply Caller Understands Yes PreDisposition Did not know what to do Care Advice Given Per Guideline SEE HCP WITHIN 4 HOURS (OR PCP TRIAGE): CALL BACK IF: * You become worse. CARE ADVICE given per Fever (Adult) guideline. After Care Instructions Given Call Event Type User Date / Time Description Education document email Rodney Cruise, RN, Hilliard Clark 02/18/2019 9:56:56 AM [facility] Connect Now Instructions Comments User: Miles Costain, RN Date/Time Eilene Ghazi Time): 02/18/2019 9:56:48 AM Caller stated is not sure UC would take client considering current situation. Advised is ok to take to ED if feels more  comfortable going there.  Also emailed teleheath visit information, am not sure if telehealth is available today due to holiday. Informed caller, calller voiced understanding. Referrals GO TO FACILITY OTHER - SPECIFY

## 2019-02-20 ENCOUNTER — Telehealth: Payer: Self-pay

## 2019-02-20 NOTE — Progress Notes (Signed)
Subjective:     Patient ID: Christopher [name], male    DOB: August 04, 1965, 53 y.o.   MRN: [id number]  Chief Complaint  Patient presents with  . Follow-up    HPI: The patient is a 53 y.o. male here for follow-up on his multiple lower extremity and gluteal wound.  Overall he is doing well.  She also reports that he has been working with physical therapy at home and is now able to walk with physical therapy the whole length of their house.  She reports that they have been doing Vaseline to all of his skin grafts daily.  They have also been packing the left hip wound and then applying K-Y jelly gauze and ABD over this and securing it in place with tape.  He also has been doing calcium alginate in his groin wound.  The wound on his left hip that is approximately 0.5 cm deep.  It appears to be noninfected and slightly hyper granulating.  There is no purulent drainage noted, no sign of infection.   Calcium alginate removed from groin wound.  Will replace dressing with home health at home tomorrow.  Very difficult to visualize this wound as it is between his legs and he is in significant pain with any form of movement.  He recently had a low-grade temperature of 100.3 the other day and was prescribed an antibiotic by PCP.  Fever has resolved.  No chills, nausea, vomiting.  He has a few abrasions on his left posterior hip/back due to tape from the bandages holding his ABD pads in place.    Review of Systems  Constitutional: Positive for malaise/fatigue and weight loss. Negative for chills, diaphoresis and fever.  Respiratory: Negative.   Cardiovascular: Negative.   Genitourinary: Negative.   Musculoskeletal: Positive for myalgias.  Skin: Positive for rash. Negative for itching.  Neurological: Positive for sensory change and weakness.     Objective:   Vital Signs BP 117/71 (BP Location: Left Arm, Patient Position: Lying right side, Cuff Size: Normal)   Pulse 85   Temp 98.7 F (37.1 C)  (Temporal)   SpO2 96%  Vital Signs and Nursing Note Reviewed  Physical Exam  Constitutional: He is oriented to person, place, and time. He appears distressed.  Thin male  HENT:  Head: Normocephalic and atraumatic.  Cardiovascular: Normal rate.  Pulmonary/Chest: Effort normal.  Abdominal: Soft.  Colostomy bag  Neurological: He is alert and oriented to person, place, and time.  Skin: Skin is warm and dry.  wound on his left posterior hip that is approximately 0.5 cm deep, 1 cm wide.  There is some drainage noted, nonpurulent.  There appears to be some hyper granulation.  No overt sign of infection.      Assessment/Plan:     ICD-10-CM   1. Wound of buttock, unspecified laterality, subsequent encounter  S31.809D     Continue with Vaseline on the skin grafts recipient sites. Packing gauze placed inside of left posterior hip wound.  Continue to remove packing gauze and change as needed.  Silver nitrate used to cauterize the hyper granulated tissue.  Due to the abrasions from the tape we elected to not put more tape on his ABD pads today and hold them in place instead with his clothing.   Spoke with Dr. Doreatha Martin during visit to determine if patient is ready for hardware to be removed from his left posterior hip.  This is possibly what is causing this left posterior hip wound not closed.  Dr. Jena GaussHaddix will see the patient in 1 to 2 weeks and we will determine a time when we can schedule surgical intervention to remove the hardware and also clean up the wound.     Kermit BaloMatthew J Salmaan Patchin, PA-C 02/21/2019, 5:30 PM

## 2019-02-20 NOTE — Telephone Encounter (Signed)
Called patient to confirm appointment scheduled for tomorrow. Spoke with patient's fiancee who answered the following questions: 1. To the best of your knowledge, have you or the patient been in close contact with any one with a confirmed diagnosis of COVID-19? No 2. Have you or the patient had any one or more of the following; fever, chills, cough, shortness of breath, or any flu-like symptoms? No 3. Have you or the patient been diagnosed with or have a previous diagnosis of COVID 19? No 4. I am going to go over a few other symptoms with you. Please let me know if you are experiencing any of the following: None of the below a. Ear, nose, or throat discomfort b. A sore throat c. Headache d. Muscle pain e. Diarrhea f. Loss of taste or smell

## 2019-02-20 NOTE — Telephone Encounter (Signed)
Patient has an appointment with [place] on (Thurs. 02/21/19).//AB/CMA

## 2019-02-20 NOTE — Telephone Encounter (Signed)
Received call from [place] with [facility] with concerns regarding patient's wound. She states his left posterior hip has milky white drainage and is tender to palpation. He had a low grade fever on Monday but does not have one today; however his blood pressure is low. Levada Dy is aware that he has an appointment with Verdie Shire, PA-C tomorrow and wanted to make [place] aware of his left posterior hip drainage and pain. She can be contacted at: [phone] if needed.

## 2019-02-20 NOTE — Telephone Encounter (Signed)
Patient has an appointment with [name],PA on (Thurs. 02/21/19).//AB/CMA 

## 2019-02-21 ENCOUNTER — Other Ambulatory Visit: Payer: Self-pay

## 2019-02-21 ENCOUNTER — Ambulatory Visit (INDEPENDENT_AMBULATORY_CARE_PROVIDER_SITE_OTHER): Payer: BC Managed Care – PPO | Admitting: Surgical

## 2019-02-21 ENCOUNTER — Encounter: Payer: Self-pay | Admitting: Surgical

## 2019-02-21 VITALS — BP 117/71 | HR 85 | Temp 98.7°F

## 2019-02-21 DIAGNOSIS — S31809D Unspecified open wound of unspecified buttock, subsequent encounter: Secondary | ICD-10-CM

## 2019-02-26 ENCOUNTER — Other Ambulatory Visit: Payer: Self-pay

## 2019-02-26 ENCOUNTER — Telehealth: Payer: Self-pay

## 2019-02-26 ENCOUNTER — Encounter: Payer: Self-pay | Admitting: Physical Medicine and Rehabilitation

## 2019-02-26 ENCOUNTER — Encounter
Payer: No Typology Code available for payment source | Attending: Registered Nurse | Admitting: Physical Medicine and Rehabilitation

## 2019-02-26 VITALS — BP 103/70 | HR 75 | Temp 98.0°F | Ht 75.0 in | Wt 155.0 lb

## 2019-02-26 DIAGNOSIS — T8149XA Infection following a procedure, other surgical site, initial encounter: Secondary | ICD-10-CM | POA: Diagnosis not present

## 2019-02-26 DIAGNOSIS — E440 Moderate protein-calorie malnutrition: Secondary | ICD-10-CM

## 2019-02-26 DIAGNOSIS — G4701 Insomnia due to medical condition: Secondary | ICD-10-CM

## 2019-02-26 DIAGNOSIS — G894 Chronic pain syndrome: Secondary | ICD-10-CM | POA: Insufficient documentation

## 2019-02-26 DIAGNOSIS — T07XXXA Unspecified multiple injuries, initial encounter: Secondary | ICD-10-CM | POA: Insufficient documentation

## 2019-02-26 DIAGNOSIS — R5381 Other malaise: Secondary | ICD-10-CM | POA: Insufficient documentation

## 2019-02-26 DIAGNOSIS — F329 Major depressive disorder, single episode, unspecified: Secondary | ICD-10-CM | POA: Insufficient documentation

## 2019-02-26 DIAGNOSIS — E876 Hypokalemia: Secondary | ICD-10-CM | POA: Insufficient documentation

## 2019-02-26 DIAGNOSIS — Z5181 Encounter for therapeutic drug level monitoring: Secondary | ICD-10-CM | POA: Insufficient documentation

## 2019-02-26 DIAGNOSIS — S32811B Multiple fractures of pelvis with unstable disruption of pelvic ring, initial encounter for open fracture: Secondary | ICD-10-CM | POA: Insufficient documentation

## 2019-02-26 DIAGNOSIS — F411 Generalized anxiety disorder: Secondary | ICD-10-CM | POA: Diagnosis not present

## 2019-02-26 DIAGNOSIS — T148XXA Other injury of unspecified body region, initial encounter: Secondary | ICD-10-CM | POA: Insufficient documentation

## 2019-02-26 DIAGNOSIS — S3730XD Unspecified injury of urethra, subsequent encounter: Secondary | ICD-10-CM | POA: Insufficient documentation

## 2019-02-26 DIAGNOSIS — E46 Unspecified protein-calorie malnutrition: Secondary | ICD-10-CM | POA: Insufficient documentation

## 2019-02-26 MED ORDER — DRONABINOL 2.5 MG PO CAPS: 2.5000 mg | ORAL_CAPSULE | Freq: Two times a day (BID) | ORAL | 0 refills | Status: AC

## 2019-02-26 MED ORDER — DOXYCYCLINE HYCLATE 100 MG PO TABS: 100.0000 mg | ORAL_TABLET | Freq: Two times a day (BID) | ORAL | 0 refills | Status: DC

## 2019-02-26 MED ORDER — QUETIAPINE FUMARATE 50 MG PO TABS: 50.0000 mg | ORAL_TABLET | Freq: Every day | ORAL | 5 refills | Status: DC

## 2019-02-26 MED ORDER — CLONAZEPAM 0.5 MG PO TABS: 0.2500 mg | ORAL_TABLET | Freq: Three times a day (TID) | ORAL | 0 refills | Status: DC | PRN

## 2019-02-26 NOTE — Telephone Encounter (Signed)
Called patient's fiancee back to relay the following:  Dr. Marla Roe has prescribed another dose antibiotics since the patient ran out yesterday. Confirmed with Dr. Tama Headings office that patient does have follow up for Monday. If no improvement with antibiotics, patient would have to go to emergency department. Fiancee to call back tomorrow with update after 24 hours of antibiotics.

## 2019-02-26 NOTE — Progress Notes (Signed)
Subjective:    Patient ID: Christopher [name], male    DOB: Apr 18, 1966, 53 y.o.   MRN: [id number]  HPI  CC: L hip pain/mmultitrauma    Having severe L hip pain- also drainage from L hip- whitish color- much more lately in last few days. Packing that wound and just did wound care before came here today.  Swelling more in low back that's supersensitive/going crazy when touches- thinks they might have remove the hardware in L hip.   Temp 100.6 this AM- starting Doxycycline today as soon as picks it up from pharmacy- just got off Keflex yesterday- was on for 7 days.   Overall, since changed fentanyl to q2 days- doing much better. Not having any REGULAR days since changed the fentanyl to q2 days; however is having a lot more pain since getting possible infection of L hip.  Slow going with PT part- does have good days and bad days still- PT actually told them might have to stop PT until pain more manageable. However on good days, can walk from 1 end of house to other with RW. Just can't tolerate on bad days. However they come at different times of day different days so hard to plan.   Sitting up to eat still tender- a struggle to get him to eat- is at a stable weight from prior to accident, however because sacrum is so tender, hard ot get him upright/sitting. Have a ROHO. Even tilting back, the max he's tolerated sitting in w/c is 90 minutes.Hurting so bad, can't eat.   Sleeping- tossing and turning a lot. Not restful sleep.    Seeing Dr. Gibson RampFeldman- seen 2 visits- televisits- likes him- feels like he;'s helpful.  Doing better since stopped Clonidine, BP is much better. Pulse is better as well.   Pain Inventory Average Pain 8 Pain Right Now 5 My pain is constant, dull, tingling and aching  In the last 24 hours, has pain interfered with the following? General activity 10 Relation with others 10 Enjoyment of life 10 What TIME of day is your pain at its worst? all Sleep (in general)  Fair  Pain is worse with: some activites Pain improves with: rest, therapy/exercise and medication Relief from Meds: 8  Mobility use a wheelchair needs help with transfers  Function I need assistance with the following:  dressing, bathing, toileting, meal prep, household duties and shopping  Neuro/Psych bladder control problems weakness numbness trouble walking  Prior Studies Any changes since last visit?  no  Physicians involved in your care Any changes since last visit?  no   Family History  Problem Relation Age of Onset  . Breast cancer Mother        with mets to the bones   Social History   Socioeconomic History  . Marital status: Single    Spouse name: Not on file  . Number of children: Not on file  . Years of education: Not on file  . Highest education level: Not on file  Occupational History  . Not on file  Social Needs  . Financial resource strain: Not on file  . Food insecurity    Worry: Not on file    Inability: Not on file  . Transportation needs    Medical: Not on file    Non-medical: Not on file  Tobacco Use  . Smoking status: Former Smoker    Packs/day: 1.00    Years: 20.00    Pack years: 20.00    Types: Cigarettes  Quit date: 06/15/2018    Years since quitting: 0.7  . Smokeless tobacco: Never Used  Substance and Sexual Activity  . Alcohol use: Never    Frequency: Never    Comment: "Every now and then"  . Drug use: Never  . Sexual activity: Yes  Lifestyle  . Physical activity    Days per week: Not on file    Minutes per session: Not on file  . Stress: Not on file  Relationships  . Social Musicianconnections    Talks on phone: Not on file    Gets together: Not on file    Attends religious service: Not on file    Active member of club or organization: Not on file    Attends meetings of clubs or organizations: Not on file    Relationship status: Not on file  Other Topics Concern  . Not on file  Social History Narrative   ** Merged  History Encounter **       Past Surgical History:  Procedure Laterality Date  . APPLICATION OF A-CELL OF BACK N/A 08/06/2018   Procedure: Application Of A-Cell Of Back;  Surgeon: Peggye Formillingham, Claire S, DO;  Location: [facility] OR;  Service: Plastics;  Laterality: N/A;  . APPLICATION OF A-CELL OF EXTREMITY Left 08/06/2018   Procedure: Application Of A-Cell Of Extremity;  Surgeon: Peggye Formillingham, Claire S, DO;  Location: [facility] OR;  Service: Plastics;  Laterality: Left;  . APPLICATION OF WOUND VAC  07/12/2018   Procedure: Application Of Wound Vac to the Left Thigh and Scrotum.;  Surgeon: Roby LoftsHaddix, Kevin P, MD;  Location: [facility] OR;  Service: Orthopedics;;  . APPLICATION OF WOUND VAC  07/10/2018   Procedure: Application Of Wound Vac;  Surgeon: Berna Bueonnor, Chelsea A, MD;  [facility];  Service: General;;  . COLOSTOMY N/A 07/23/2018   Procedure: COLOSTOMY;  Surgeon: Violeta Gelinashompson, Burke, MD;  [facility];  Service: General;  Laterality: N/A;  . CYSTOSCOPY W/ URETERAL STENT PLACEMENT N/A 07/15/2018   Procedure: RETROGRADE URETHROGRAM;  Surgeon: Marcine Matarahlstedt, Stephen, MD;  [facility];  Service: Urology;  Laterality: N/A;  . ESOPHAGOGASTRODUODENOSCOPY N/A 08/14/2018   Procedure: ESOPHAGOGASTRODUODENOSCOPY (EGD);  Surgeon: Violeta Gelinashompson, Burke, MD;  [facility];  Service: General;  Laterality: N/A;  bedside  . FACIAL RECONSTRUCTION SURGERY     X 2--once as a teenager and second time in his 5630's  . HIP PINNING,CANNULATED Left 07/12/2018   Procedure: CANNULATED HIP PINNING;  Surgeon: Roby LoftsHaddix, Kevin P, MD;  Location: [facility] OR;  Service: Orthopedics;  Laterality: Left;  . HIP SURGERY    . I&D EXTREMITY Left 07/25/2018   Procedure: Debridement of buttock, scrotum and left leg, placement of acell and vac;  Surgeon: Peggye Formillingham, Claire S, DO;  [facility]: [facility];  Service: Plastics;  Laterality: Left;  . I&D EXTREMITY N/A 08/06/2018   Procedure: Debridement of buttock, scrotum and left leg;  Surgeon: Peggye Formillingham, Claire S, DO;  [facility]: [facility]  OR;  Service: Plastics;  Laterality: N/A;  . I&D EXTREMITY N/A 08/13/2018   Procedure: Debridement of buttock, scrotum and left leg, placement of acell and vac;  Surgeon: Peggye Formillingham, Claire S, DO;  [facility]: [facility];  Service: Plastics;  Laterality: N/A;  90 min, please  . INCISION AND DRAINAGE OF WOUND N/A 07/18/2018   Procedure: Debridement of left leg, buttocks and scrotal wound with placement of acell and Flexiseal;  Surgeon: Peggye Formillingham, Claire S, DO;  [facility]: [facility];  Service: Plastics;  Laterality: N/A;  . INCISION AND DRAINAGE OF WOUND Left  08/29/2018   Procedure: Debridement of buttock, scrotum and left leg, placement of acell and vac;  Surgeon: Peggye Form, DO;  [facility]: [facility];  Service: Plastics;  Laterality: Left;  75 min, please  . INCISION AND DRAINAGE OF WOUND Bilateral 10/23/2018   Procedure: DEBRIDEMENT OF BUTTOCK,SCROTUM, AND LEG WOUNDS WITH PLACEMENT OF ACELL- BILATERAL 90 MIN;  Surgeon: Peggye Form, DO;  Location: [facility] OR;  Service: Plastics;  Laterality: Bilateral;  . IR ANGIOGRAM PELVIS SELECTIVE OR SUPRASELECTIVE  07/10/2018  . IR ANGIOGRAM PELVIS SELECTIVE OR SUPRASELECTIVE  07/10/2018  . IR ANGIOGRAM SELECTIVE EACH ADDITIONAL VESSEL  07/10/2018  . IR EMBO ART  VEN HEMORR LYMPH EXTRAV  INC GUIDE ROADMAPPING  07/10/2018  . IR US GUIDE BX ASP/DRAIN  07/10/2018  . IR US GUIDE VASC ACCESS RIGHT  07/10/2018  . IR VENO/EXT/UNI LEFT  07/10/2018  . IRRIGATION AND DEBRIDEMENT OF WOUND WITH SPLIT THICKNESS SKIN GRAFT Left 09/19/2018   Procedure: Debridement of gluteal wound with placement of acell to left leg.;  Surgeon: Peggye Form, DO;  [facility]: [facility];  Service: Plastics;  Laterality: Left;  2.5 hours, please  . LAPAROTOMY N/A 07/12/2018   Procedure: EXPLORATORY LAPAROTOMY;  Surgeon: Violeta Gelinas, MD;  Location: [facility] OR;  Service: General;  Laterality: N/A;  . LAPAROTOMY N/A 07/15/2018   Procedure: WOUND EXPLORATION; CLOSURE OF ABDOMEN;  Surgeon: Violeta Gelinas, MD;   [facility] OR;  Service: General;  Laterality: N/A;  . LAPAROTOMY  07/10/2018   Procedure: Exploratory Laparotomy;  Surgeon: Berna Bue, MD;  Location: [facility] OR;  Service: General;;  . PEG PLACEMENT N/A 08/14/2018   Procedure: PERCUTANEOUS ENDOSCOPIC GASTROSTOMY (PEG) PLACEMENT;  Surgeon: Violeta Gelinas, MD;  Location: [facility] ENDOSCOPY;  Service: General;  Laterality: N/A;  . PERCUTANEOUS TRACHEOSTOMY N/A 08/02/2018   Procedure: PERCUTANEOUS TRACHEOSTOMY;  Surgeon: Violeta Gelinas, MD;  Location: [facility] OR;  Service: General;  Laterality: N/A;  . RADIOLOGY WITH ANESTHESIA N/A 07/10/2018   Procedure: IR WITH ANESTHESIA;  Surgeon: Simonne Come, MD;  Location: [facility] OR;  Service: Radiology;  Laterality: N/A;  . RADIOLOGY WITH ANESTHESIA Right 07/10/2018   Procedure: Ir With Anesthesia;  Surgeon: Simonne Come, MD;  Location: [facility] OR;  Service: Radiology;  Laterality: Right;  . SCROTAL EXPLORATION N/A 07/15/2018   Procedure: SCROTUM DEBRIDEMENT;  Surgeon: Marcine Matar, MD;  Location: [facility] OR;  Service: Urology;  Laterality: N/A;  . SHOULDER SURGERY    . SKIN SPLIT GRAFT Right 09/19/2018   Procedure: Skin Graft Split Thickness;  Surgeon: Peggye Form, DO;  [facility];  Service: Plastics;  Laterality: Right;  . SKIN SPLIT GRAFT N/A 10/03/2018   Procedure: Split thickness skin graft to gluteal area with acell placement;  Surgeon: Peggye Form, DO;  [facility]: [facility] OR;  Service: Plastics;  Laterality: N/A;  3 hours, please  . VACUUM ASSISTED CLOSURE CHANGE N/A 07/12/2018   Procedure: ABDOMINAL VACUUM ASSISTED CLOSURE CHANGE and abdominal washout;  Surgeon: Violeta Gelinas, MD;  Location: [facility] OR;  Service: General;  Laterality: N/A;  . WOUND DEBRIDEMENT Left 07/23/2018   Procedure: DEBRIDEMENT LEFT BUTTOCK  WOUND;  Surgeon: Violeta Gelinas, MD;  Location: [facility] OR;  Service: General;  Laterality: Left;  . WOUND EXPLORATION Left 07/10/2018   Procedure: WOUND EXPLORATION LEFT GROIN;  Surgeon: Berna Bue, MD;  Location: [facility] OR;  Service: General;  Laterality: Left;   Past Medical History:  Diagnosis Date  . Acute on chronic respiratory failure with hypoxia (HCC)   .  Bacteremia due to Pseudomonas   . Chronic pain syndrome   . Multiple traumatic injuries    BP 103/70   Pulse 75   Temp 98 F (36.7 C)   Ht 6\' 3"  (1.905 m)   Wt 155 lb (70.3 kg)   SpO2 96%   BMI 19.37 kg/m   Opioid Risk Score:   Fall Risk Score:  `1  Depression screen PHQ 2/9  Depression screen PHQ 2/9 12/27/2018  Decreased Interest 2  Down, Depressed, Hopeless 0  PHQ - 2 Score 2  Altered sleeping 1  Tired, decreased energy 2  Change in appetite 1  Feeling bad or failure about yourself  1  Trouble concentrating 0  Moving slowly or fidgety/restless 2  Suicidal thoughts 0  PHQ-9 Score 9     Review of Systems  Constitutional: Negative.   HENT: Negative.   Eyes: Negative.   Respiratory: Negative.   Cardiovascular: Negative.   Gastrointestinal: Negative.   Endocrine: Negative.   Genitourinary: Negative.   Musculoskeletal: Negative.   Skin: Negative.   Allergic/Immunologic: Negative.   Neurological: Positive for weakness.  Hematological: Negative.   Psychiatric/Behavioral: Negative.   All other systems reviewed and are negative.      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   1. Needs to have PT and OT at the same time on scheduled days- to make sure gets PT/OT regularly.   2. Suggest having pt in W/C when PT is there so can be fitted better for him.   3. Will see if can start Marinol for for appetite- Periactin, Remeron, and Megace are all very sedating- don't suggest for this patient- think low-medium dose Marinol could be effective. 2.5 mg 2x/day before meals x 1 week then 5 mg 2x/day- for appetite- could help with pain.   4. Percocet 5/325 mg #150- to be refilled on 9/24 at earliest due to 31 days in August.  5. Just refilled Fentanyl a week ago. Due 03/18/2019 Refilled Klonopin 0.5 3x/day as needed- for anxiety- would like Rx to last 2 months at minimum.   6. Can increase Seroquel to 50 mg nightly for insomnia.   7. F/U in 4-6 weeks.  I spent a total of 45 minutes on appointment- more than 30 minutes going over plan as detailed above- discussing options for appetite, chronic pain and sleep.

## 2019-02-26 NOTE — Telephone Encounter (Signed)
Received call from patient's fiancee in regards to the patient's wound. Patient finished his antibiotic yesterday and has spiked a fever. Patient also has some concerns about his wound. It is swollen and more painful. Pain isn't controlled per fiancee. Please contact and advise

## 2019-02-26 NOTE — Progress Notes (Signed)
02/26/19 Antibiotic sent in due to increase in swelling.

## 2019-02-26 NOTE — Addendum Note (Signed)
Addended by: [name] on: 02/26/2019 12:30 PM   Modules accepted: Orders

## 2019-02-26 NOTE — Patient Instructions (Addendum)
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   1. Needs to have PT and OT at the same time on scheduled days- to make sure gets PT/OT regularly.   2. Suggest having pt in W/C when PT is there so can be fitted better for him.   3. Will see if can start Marinol for for appetite- Periactin, Remeron, and Megace are all very sedating- don't suggest for this patient- think low-medium dose Marinol could be effective. 2.5 mg 2x/day before meals x 1 week then 5 mg 2x/day- for appetite- could help with pain.   4. Percocet 5/325 mg #150- to be refilled on 9/24 at earliest due to 31 days in August. 1. Needs to have PT and OT at the same time on scheduled days- to make sure gets PT/OT regularly.   2. Suggest having pt in W/C when PT is there so can be fitted better for him.   3. Will see if can start Marinol for for appetite- Periactin, Remeron, and Megace are all very sedating- don't suggest for this patient- think low-medium dose Marinol could be effective. 2.5 mg 2x/day before meals x 1 week then 5 mg 2x/day- for appetite- could help with pain.   4. Percocet 5/325 mg #150- to be refilled on 9/24 at earliest due to 31 days in August.  5. Just refilled Fentanyl a week ago. Due 03/18/2019 1. Needs to have PT and OT at the same time on scheduled days- to make sure gets PT/OT regularly.   2. Suggest having pt in W/C when PT is there so can be fitted better for him.   3. Will see if can start Marinol for for appetite- Periactin, Remeron, and Megace are all very sedating- don't suggest for this patient- think low-medium dose Marinol could be effective. 2.5 mg 2x/day  before meals x 1 week then 5 mg 2x/day- for appetite- could help with pain.   4. Percocet 5/325 mg #150- to be refilled on 9/24 at earliest due to 31 days in August. Refilled Klonopin 0.5 3x/day as needed- for anxiety- would like Rx to last 2 months at minimum.  5. Just refilled Fentanyl a week ago. Due 03/18/2019   6. Can increase Seroquel to 50 mg nightly for insomnia.   7. F/U in 4-6 weeks.  6. Can increase Seroquel to 50 mg nightly for insomnia.   7. F/U in 4-6 weeks.  5. Just refilled Fentanyl a week ago. Due 03/18/2019   6. Can increase Seroquel to 50 mg nightly for insomnia.   7. F/U in 4-6 weeks.

## 2019-02-28 ENCOUNTER — Emergency Department (HOSPITAL_COMMUNITY): Payer: No Typology Code available for payment source

## 2019-02-28 ENCOUNTER — Telehealth: Payer: Self-pay

## 2019-02-28 ENCOUNTER — Other Ambulatory Visit: Payer: Self-pay

## 2019-02-28 ENCOUNTER — Telehealth: Payer: Self-pay | Admitting: Physical Medicine and Rehabilitation

## 2019-02-28 ENCOUNTER — Inpatient Hospital Stay (HOSPITAL_COMMUNITY)
Admission: EM | Admit: 2019-02-28 | Discharge: 2019-03-12 | DRG: 856 | Disposition: A | Discharge status: Home Health | Payer: No Typology Code available for payment source | Attending: Internal Medicine | Admitting: Internal Medicine

## 2019-02-28 DIAGNOSIS — R269 Unspecified abnormalities of gait and mobility: Secondary | ICD-10-CM | POA: Diagnosis present

## 2019-02-28 DIAGNOSIS — D62 Acute posthemorrhagic anemia: Secondary | ICD-10-CM | POA: Diagnosis not present

## 2019-02-28 DIAGNOSIS — F329 Major depressive disorder, single episode, unspecified: Secondary | ICD-10-CM

## 2019-02-28 DIAGNOSIS — T8149XA Infection following a procedure, other surgical site, initial encounter: Principal | ICD-10-CM | POA: Diagnosis present

## 2019-02-28 DIAGNOSIS — S31829D Unspecified open wound of left buttock, subsequent encounter: Secondary | ICD-10-CM

## 2019-02-28 DIAGNOSIS — S3802XD Crushing injury of scrotum and testis, subsequent encounter: Secondary | ICD-10-CM | POA: Diagnosis not present

## 2019-02-28 DIAGNOSIS — M8668 Other chronic osteomyelitis, other site: Secondary | ICD-10-CM | POA: Diagnosis present

## 2019-02-28 DIAGNOSIS — N134 Hydroureter: Secondary | ICD-10-CM | POA: Diagnosis present

## 2019-02-28 DIAGNOSIS — R7982 Elevated C-reactive protein (CRP): Secondary | ICD-10-CM | POA: Diagnosis not present

## 2019-02-28 DIAGNOSIS — Z87891 Personal history of nicotine dependence: Secondary | ICD-10-CM | POA: Diagnosis not present

## 2019-02-28 DIAGNOSIS — Z681 Body mass index (BMI) 19 or less, adult: Secondary | ICD-10-CM

## 2019-02-28 DIAGNOSIS — R64 Cachexia: Secondary | ICD-10-CM | POA: Diagnosis present

## 2019-02-28 DIAGNOSIS — S329XXA Fracture of unspecified parts of lumbosacral spine and pelvis, initial encounter for closed fracture: Secondary | ICD-10-CM

## 2019-02-28 DIAGNOSIS — Z933 Colostomy status: Secondary | ICD-10-CM

## 2019-02-28 DIAGNOSIS — Z79899 Other long term (current) drug therapy: Secondary | ICD-10-CM

## 2019-02-28 DIAGNOSIS — K219 Gastro-esophageal reflux disease without esophagitis: Secondary | ICD-10-CM | POA: Diagnosis present

## 2019-02-28 DIAGNOSIS — T798XXA Other early complications of trauma, initial encounter: Secondary | ICD-10-CM | POA: Diagnosis present

## 2019-02-28 DIAGNOSIS — Z8619 Personal history of other infectious and parasitic diseases: Secondary | ICD-10-CM | POA: Diagnosis not present

## 2019-02-28 DIAGNOSIS — T148XXA Other injury of unspecified body region, initial encounter: Secondary | ICD-10-CM | POA: Diagnosis present

## 2019-02-28 DIAGNOSIS — M86652 Other chronic osteomyelitis, left thigh: Secondary | ICD-10-CM

## 2019-02-28 DIAGNOSIS — Z23 Encounter for immunization: Secondary | ICD-10-CM | POA: Diagnosis not present

## 2019-02-28 DIAGNOSIS — R590 Localized enlarged lymph nodes: Secondary | ICD-10-CM | POA: Diagnosis not present

## 2019-02-28 DIAGNOSIS — E876 Hypokalemia: Secondary | ICD-10-CM | POA: Diagnosis present

## 2019-02-28 DIAGNOSIS — G8921 Chronic pain due to trauma: Secondary | ICD-10-CM | POA: Diagnosis present

## 2019-02-28 DIAGNOSIS — R51 Headache: Secondary | ICD-10-CM | POA: Diagnosis not present

## 2019-02-28 DIAGNOSIS — F339 Major depressive disorder, recurrent, unspecified: Secondary | ICD-10-CM | POA: Diagnosis present

## 2019-02-28 DIAGNOSIS — S31000D Unspecified open wound of lower back and pelvis without penetration into retroperitoneum, subsequent encounter: Secondary | ICD-10-CM | POA: Diagnosis not present

## 2019-02-28 DIAGNOSIS — W230XXD Caught, crushed, jammed, or pinched between moving objects, subsequent encounter: Secondary | ICD-10-CM | POA: Diagnosis present

## 2019-02-28 DIAGNOSIS — N1330 Unspecified hydronephrosis: Secondary | ICD-10-CM | POA: Diagnosis not present

## 2019-02-28 DIAGNOSIS — B965 Pseudomonas (aeruginosa) (mallei) (pseudomallei) as the cause of diseases classified elsewhere: Secondary | ICD-10-CM | POA: Diagnosis present

## 2019-02-28 DIAGNOSIS — Z9359 Other cystostomy status: Secondary | ICD-10-CM

## 2019-02-28 DIAGNOSIS — S381XXD Crushing injury of abdomen, lower back, and pelvis, subsequent encounter: Secondary | ICD-10-CM | POA: Diagnosis not present

## 2019-02-28 DIAGNOSIS — Z20828 Contact with and (suspected) exposure to other viral communicable diseases: Secondary | ICD-10-CM | POA: Diagnosis present

## 2019-02-28 DIAGNOSIS — Z419 Encounter for procedure for purposes other than remedying health state, unspecified: Secondary | ICD-10-CM

## 2019-02-28 DIAGNOSIS — L089 Local infection of the skin and subcutaneous tissue, unspecified: Secondary | ICD-10-CM | POA: Diagnosis present

## 2019-02-28 DIAGNOSIS — F418 Other specified anxiety disorders: Secondary | ICD-10-CM | POA: Diagnosis present

## 2019-02-28 DIAGNOSIS — R000 Tachycardia, unspecified: Secondary | ICD-10-CM | POA: Diagnosis not present

## 2019-02-28 DIAGNOSIS — G894 Chronic pain syndrome: Secondary | ICD-10-CM | POA: Diagnosis present

## 2019-02-28 DIAGNOSIS — S31809A Unspecified open wound of unspecified buttock, initial encounter: Secondary | ICD-10-CM | POA: Diagnosis present

## 2019-02-28 DIAGNOSIS — E440 Moderate protein-calorie malnutrition: Secondary | ICD-10-CM | POA: Diagnosis not present

## 2019-02-28 DIAGNOSIS — N132 Hydronephrosis with renal and ureteral calculous obstruction: Secondary | ICD-10-CM | POA: Diagnosis present

## 2019-02-28 DIAGNOSIS — S31819D Unspecified open wound of right buttock, subsequent encounter: Secondary | ICD-10-CM | POA: Diagnosis not present

## 2019-02-28 DIAGNOSIS — F419 Anxiety disorder, unspecified: Secondary | ICD-10-CM | POA: Diagnosis not present

## 2019-02-28 DIAGNOSIS — B9689 Other specified bacterial agents as the cause of diseases classified elsewhere: Secondary | ICD-10-CM | POA: Diagnosis not present

## 2019-02-28 DIAGNOSIS — E43 Unspecified severe protein-calorie malnutrition: Secondary | ICD-10-CM | POA: Diagnosis present

## 2019-02-28 DIAGNOSIS — F17211 Nicotine dependence, cigarettes, in remission: Secondary | ICD-10-CM | POA: Diagnosis not present

## 2019-02-28 DIAGNOSIS — M869 Osteomyelitis, unspecified: Secondary | ICD-10-CM | POA: Diagnosis not present

## 2019-02-28 DIAGNOSIS — Z931 Gastrostomy status: Secondary | ICD-10-CM | POA: Diagnosis not present

## 2019-02-28 DIAGNOSIS — R5383 Other fatigue: Secondary | ICD-10-CM | POA: Diagnosis not present

## 2019-02-28 DIAGNOSIS — F411 Generalized anxiety disorder: Secondary | ICD-10-CM | POA: Diagnosis present

## 2019-02-28 DIAGNOSIS — S32302G Unspecified fracture of left ilium, subsequent encounter for fracture with delayed healing: Secondary | ICD-10-CM

## 2019-02-28 DIAGNOSIS — R8281 Pyuria: Secondary | ICD-10-CM | POA: Diagnosis present

## 2019-02-28 DIAGNOSIS — Z960 Presence of urogenital implants: Secondary | ICD-10-CM | POA: Diagnosis present

## 2019-02-28 DIAGNOSIS — S71002A Unspecified open wound, left hip, initial encounter: Secondary | ICD-10-CM | POA: Diagnosis present

## 2019-02-28 DIAGNOSIS — F32A Depression, unspecified: Secondary | ICD-10-CM | POA: Diagnosis present

## 2019-02-28 LAB — COMPREHENSIVE METABOLIC PANEL
ALT: 16 U/L (ref 0–44)
AST: 21 U/L (ref 15–41)
Albumin: 2.8 g/dL — ABNORMAL LOW (ref 3.5–5.0)
Alkaline Phosphatase: 130 U/L — ABNORMAL HIGH (ref 38–126)
Anion gap: 13 (ref 5–15)
BUN: 11 mg/dL (ref 6–20)
CO2: 23 mmol/L (ref 22–32)
Calcium: 10 mg/dL (ref 8.9–10.3)
Chloride: 104 mmol/L (ref 98–111)
Creatinine, Ser: 0.56 mg/dL — ABNORMAL LOW (ref 0.61–1.24)
GFR calc Af Amer: >60 mL/min (ref >60)
GFR calc non Af Amer: >60 mL/min (ref >60)
Glucose, Bld: 111 mg/dL — ABNORMAL HIGH (ref 70–99)
Potassium: 4 mmol/L (ref 3.5–5.1)
Sodium: 140 mmol/L (ref 135–145)
Total Bilirubin: 0.6 mg/dL (ref 0.3–1.2)
Total Protein: 7.8 g/dL (ref 6.5–8.1)

## 2019-02-28 LAB — CBC WITH DIFFERENTIAL/PLATELET
Abs Immature Granulocytes: 0.01 10*3/uL (ref 0.00–0.07)
Basophils Absolute: 0 10*3/uL (ref 0.0–0.1)
Basophils Relative: 0 %
Eosinophils Absolute: 0.2 10*3/uL (ref 0.0–0.5)
Eosinophils Relative: 3 %
HCT: 40.6 % (ref 39.0–52.0)
Hemoglobin: 12.5 g/dL — ABNORMAL LOW (ref 13.0–17.0)
Immature Granulocytes: 0 %
Lymphocytes Relative: 17 %
Lymphs Abs: 1 10*3/uL (ref 0.7–4.0)
MCH: 31.9 pg (ref 26.0–34.0)
MCHC: 30.8 g/dL (ref 30.0–36.0)
MCV: 103.6 fL — ABNORMAL HIGH (ref 80.0–100.0)
Monocytes Absolute: 0.3 10*3/uL (ref 0.1–1.0)
Monocytes Relative: 5 %
Neutro Abs: 4.3 10*3/uL (ref 1.7–7.7)
Neutrophils Relative %: 75 %
Platelets: 379 10*3/uL (ref 150–400)
RBC: 3.92 MIL/uL — ABNORMAL LOW (ref 4.22–5.81)
RDW: 13 % (ref 11.5–15.5)
WBC: 5.8 10*3/uL (ref 4.0–10.5)
nRBC: 0 % (ref 0.0–0.2)

## 2019-02-28 LAB — LACTIC ACID, PLASMA
Lactic Acid, Venous: 1.6 mmol/L (ref 0.5–1.9)
Lactic Acid, Venous: 1.5 mmol/L (ref 0.5–1.9)

## 2019-02-28 LAB — SEDIMENTATION RATE: Sed Rate: 90 mm/hr — ABNORMAL HIGH (ref 0–16)

## 2019-02-28 LAB — C-REACTIVE PROTEIN: CRP: 10.5 mg/dL — ABNORMAL HIGH (ref <1.0)

## 2019-02-28 MED ORDER — OXYCODONE-ACETAMINOPHEN 5-325 MG PO TABS
1.0000 | ORAL_TABLET | Freq: Once | ORAL | Status: AC
  Administered 2019-02-28: 1 via ORAL
  Filled 2019-02-28: qty 1

## 2019-02-28 MED ORDER — SODIUM CHLORIDE 0.9 % IV SOLN
2.0000 g | Freq: Three times a day (TID) | INTRAVENOUS | Status: DC
  Administered 2019-03-01 – 2019-03-12 (×34): 2 g via INTRAVENOUS
  Filled 2019-02-28 (×40): qty 2

## 2019-02-28 MED ORDER — VANCOMYCIN HCL 10 G IV SOLR
1500.0000 mg | Freq: Once | INTRAVENOUS | Status: AC
  Administered 2019-02-28: 1500 mg via INTRAVENOUS
  Filled 2019-02-28: qty 1500

## 2019-02-28 MED ORDER — HYDROCODONE-ACETAMINOPHEN 5-325 MG PO TABS: 1.0000 | ORAL_TABLET | Freq: Once | ORAL | Status: DC

## 2019-02-28 MED ORDER — HYDROMORPHONE HCL 1 MG/ML IJ SOLN
1.0000 mg | Freq: Once | INTRAMUSCULAR | Status: AC
  Administered 2019-02-28: 1 mg via INTRAVENOUS
  Filled 2019-02-28: qty 1

## 2019-02-28 MED ORDER — VANCOMYCIN HCL IN DEXTROSE 1-5 GM/200ML-% IV SOLN: 1000.0000 mg | Freq: Once | INTRAVENOUS | Status: DC

## 2019-02-28 MED ORDER — SODIUM CHLORIDE 0.9 % IV SOLN
2.0000 g | Freq: Once | INTRAVENOUS | Status: AC
  Administered 2019-02-28: 2 g via INTRAVENOUS
  Filled 2019-02-28: qty 2

## 2019-02-28 MED ORDER — MORPHINE SULFATE (PF) 2 MG/ML IV SOLN
2.0000 mg | INTRAVENOUS | Status: DC | PRN
  Administered 2019-03-01 (×3): 2 mg via INTRAVENOUS
  Filled 2019-02-28 (×3): qty 1

## 2019-02-28 MED ORDER — VANCOMYCIN HCL 10 G IV SOLR
1250.0000 mg | Freq: Two times a day (BID) | INTRAVENOUS | Status: DC
  Administered 2019-03-01: 1250 mg via INTRAVENOUS
  Filled 2019-02-28 (×3): qty 1250

## 2019-02-28 MED ORDER — METHOCARBAMOL 500 MG PO TABS
500.0000 mg | ORAL_TABLET | Freq: Once | ORAL | Status: AC
  Administered 2019-02-28: 500 mg via ORAL
  Filled 2019-02-28: qty 1

## 2019-02-28 MED ORDER — ENSURE ENLIVE PO LIQD
237.0000 mL | Freq: Two times a day (BID) | ORAL | Status: DC
  Administered 2019-03-01: 237 mL via ORAL
  Filled 2019-02-28: qty 237

## 2019-02-28 MED ORDER — IOHEXOL 300 MG/ML  SOLN
100.0000 mL | Freq: Once | INTRAMUSCULAR | Status: AC | PRN
  Administered 2019-02-28: 100 mL via INTRAVENOUS

## 2019-02-28 NOTE — H&P (Addendum)
History and Physical    Christopher [name] GYB:[id number] DOB: 1965-12-27 DOA: 02/28/2019  Referring MD/NP/PA:   PCP: Vivi Barrack, MD   Patient coming from:  The patient is coming from home.  At baseline, pt is independent for most of ADL.        Chief Complaint: chronic wound infection  HPI: Christopher [name] is a 53 y.o. 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  Pt states that he was involved in a very significant trauma in January and has had chronic wound in left buttock area since that time. Pt is following up with plastic surgery as outpatient for wound management. He noted increased drainage in the past several days. He had mild fever at home.  He is body temperature is 97.9 in ED.  Patient was started with doxycycline last Thursday without improvement.  He continues to have worsening pain, which is constant, sharp, 9 out of 10 in severity, nonradiating. Patient does not have chest pain, shortness breath, cough, nausea, vomiting, diarrhea, abdominal pain, symptoms of UTI.  ED Course: pt was found to have WBC 5.8, lactic acid 1.5, pending COVID-19 test, electrolytes renal function okay, temperature normal, blood pressure 121/79, heart rate 94, oxygen saturation 94-97% on room air.  Chest x-ray negative.  CT of pelvis showed possible chronic osteomyelitis, and kidney stone and right mild hydronephrosis.  Patient is admitted to [facility] bed for observation.  Urology, Dr. Junious Silk was consulted by EDP.  CT-pelvis:  1. Extensive bilateral left greater than right pelvic fractures with surgical hardware fixating the SI joints, proximal left femur, and left acetabulum and superior pubic ramus. Interval increased fragmentation of comminuted left iliac bone fracture with heterogeneous  attenuation of dominant fracture fragment abutting the left SI joint, possibly reflecting chronic osteomyelitis. Suspicion of some hardware loosening of the SI joint fixating screws, as a gap is now seen between the screw heads and the proximal ring shaped fastener. Mild osteolytic changes about the tip of the left acetabular fixating screw at the pubic symphysis.  2. Heterogeneous soft tissue thickening, edema and probable fluid involving the left gluteal and posterior and lateral hip soft tissues, but without rim enhancing organized soft tissue abscess. Interval atrophy of left gluteal and hip musculature. Loss of subcutaneous fatty tissues with probable wound overlying the left posterior sacrum and iliac bone.  3. Suprapubic catheter with thickened bladder wall. Interim finding of mild right hydronephrosis and hydroureter, there are multiple stones within the right posterior bladder near the UVJ. Urothelial enhancement on the right could reflect ascending urinary tract infection. Right kidney stone  Review of Systems:   General: has subjective fevers, chills, no body weight gain, has poor appetite, has fatigue HEENT: no blurry vision, hearing changes or sore throat Respiratory: no dyspnea, coughing, wheezing CV: no chest pain, no palpitations GI: no nausea, vomiting, abdominal pain, diarrhea, constipation. s/p of suprapubic catheter, s/p of colostomy, GU: no dysuria, burning on urination, increased urinary frequency, hematuria  Ext: no leg edema Neuro: no unilateral weakness, numbness, or tingling, no vision change or hearing loss Skin: has wound in left buttock area with pus drainage MSK: No muscle spasm, no deformity, no limitation of range of movement in spin Heme: No easy bruising.  Travel history: No recent long distant travel.  Allergy: No Active Allergies  Past Medical History:  Diagnosis Date   Acute on chronic respiratory failure with hypoxia (HCC)    Bacteremia  due to Pseudomonas    Chronic pain syndrome    Multiple traumatic injuries     Past Surgical History:  Procedure Laterality Date   APPLICATION OF A-CELL OF BACK N/A 08/06/2018   Procedure: Application Of A-Cell Of Back;  Surgeon: [name], DO;  Location: [place];  Service: Plastics;  Laterality: N/A;   APPLICATION OF A-CELL OF EXTREMITY Left 08/06/2018   Procedure: Application Of A-Cell Of Extremity;  Surgeon: [name], DO;  Location: [place];  Service: Plastics;  Laterality: Left;   APPLICATION OF WOUND VAC  07/12/2018   Procedure: Application Of Wound Vac to the Left Thigh and Scrotum.;  Surgeon: Shona Needles, MD;  Location: [place];  Service: Orthopedics;;   APPLICATION OF WOUND VAC  07/10/2018   Procedure: Application Of Wound Vac;  Surgeon: Clovis Riley, MD;  Location: [place];  Service: General;;   COLOSTOMY N/A 07/23/2018   Procedure: COLOSTOMY;  Surgeon: Georganna Skeans, MD;  Location: [place];  Service: General;  Laterality: N/A;   CYSTOSCOPY W/ URETERAL STENT PLACEMENT N/A 07/15/2018   Procedure: RETROGRADE URETHROGRAM;  Surgeon: Franchot Gallo, MD;  Location: [place];  Service: Urology;  Laterality: N/A;   ESOPHAGOGASTRODUODENOSCOPY N/A 08/14/2018   Procedure: ESOPHAGOGASTRODUODENOSCOPY (EGD);  Surgeon: Georganna Skeans, MD;  Location: [place];  Service: General;  Laterality: N/A;  bedside   FACIAL RECONSTRUCTION SURGERY     X 2--once as a teenager and second time in his 79's   HIP PINNING,CANNULATED Left 07/12/2018   Procedure: CANNULATED HIP PINNING;  Surgeon: Shona Needles, MD;  Location: [place];  Service: Orthopedics;  Laterality: Left;   HIP SURGERY     I&D EXTREMITY Left 07/25/2018   Procedure: Debridement of buttock, scrotum and left leg, placement of acell and vac;  Surgeon: [name], DO;  Location: [place];  Service: Plastics;  Laterality: Left;   I&D EXTREMITY N/A 08/06/2018   Procedure: Debridement of buttock, scrotum and  left leg;  Surgeon: [name], DO;  Location: [place];  Service: Plastics;  Laterality: N/A;   I&D EXTREMITY N/A 08/13/2018   Procedure: Debridement of buttock, scrotum and left leg, placement of acell and vac;  Surgeon: [name], DO;  Location: [place];  Service: Plastics;  Laterality: N/A;  90 min, please   INCISION AND DRAINAGE OF WOUND N/A 07/18/2018   Procedure: Debridement of left leg, buttocks and scrotal wound with placement of acell and Flexiseal;  Surgeon: [name], DO;  Location: [place];  Service: Plastics;  Laterality: N/A;   INCISION AND DRAINAGE OF WOUND Left 08/29/2018   Procedure: Debridement of buttock, scrotum and left leg, placement of acell and vac;  Surgeon: [name], DO;  Location: [place];  Service: Plastics;  Laterality: Left;  75 min, please   INCISION AND DRAINAGE OF WOUND Bilateral 10/23/2018   Procedure: DEBRIDEMENT OF BUTTOCK,SCROTUM, AND LEG WOUNDS WITH PLACEMENT OF ACELL- BILATERAL 90 MIN;  Surgeon: [name], DO;  Location: [place];  Service: Plastics;  Laterality: Bilateral;   IR ANGIOGRAM PELVIS SELECTIVE OR SUPRASELECTIVE  07/10/2018   IR ANGIOGRAM PELVIS SELECTIVE OR SUPRASELECTIVE  07/10/2018   IR ANGIOGRAM SELECTIVE EACH ADDITIONAL VESSEL  07/10/2018   IR EMBO ART  VEN HEMORR LYMPH EXTRAV  INC GUIDE ROADMAPPING  07/10/2018   IR US GUIDE BX ASP/DRAIN  07/10/2018  IR US GUIDE VASC ACCESS RIGHT  07/10/2018   IR VENO/EXT/UNI LEFT  07/10/2018   IRRIGATION AND DEBRIDEMENT OF WOUND WITH SPLIT THICKNESS SKIN GRAFT Left 09/19/2018   Procedure: Debridement of gluteal wound with placement of acell to left leg.;  Surgeon: [name], DO;  Location: [place];  Service: Plastics;  Laterality: Left;  2.5 hours, please   LAPAROTOMY N/A 07/12/2018   Procedure: EXPLORATORY LAPAROTOMY;  Surgeon: Georganna Skeans, MD;  Location: [place];  Service: General;  Laterality: N/A;   LAPAROTOMY N/A 07/15/2018   Procedure: WOUND  EXPLORATION; CLOSURE OF ABDOMEN;  Surgeon: Georganna Skeans, MD;  Location: [place];  Service: General;  Laterality: N/A;   LAPAROTOMY  07/10/2018   Procedure: Exploratory Laparotomy;  Surgeon: Clovis Riley, MD;  Location: [place];  Service: General;;   PEG PLACEMENT N/A 08/14/2018   Procedure: PERCUTANEOUS ENDOSCOPIC GASTROSTOMY (PEG) PLACEMENT;  Surgeon: Georganna Skeans, MD;  Location: [place];  Service: General;  Laterality: N/A;   PERCUTANEOUS TRACHEOSTOMY N/A 08/02/2018   Procedure: PERCUTANEOUS TRACHEOSTOMY;  Surgeon: Georganna Skeans, MD;  Location: [place];  Service: General;  Laterality: N/A;   RADIOLOGY WITH ANESTHESIA N/A 07/10/2018   Procedure: IR WITH ANESTHESIA;  Surgeon: Sandi Mariscal, MD;  Location: [place];  Service: Radiology;  Laterality: N/A;   RADIOLOGY WITH ANESTHESIA Right 07/10/2018   Procedure: Ir With Anesthesia;  Surgeon: Sandi Mariscal, MD;  Location: [place];  Service: Radiology;  Laterality: Right;   SCROTAL EXPLORATION N/A 07/15/2018   Procedure: SCROTUM DEBRIDEMENT;  Surgeon: Franchot Gallo, MD;  Location: [place];  Service: Urology;  Laterality: N/A;   SHOULDER SURGERY     SKIN SPLIT GRAFT Right 09/19/2018   Procedure: Skin Graft Split Thickness;  Surgeon: [name], DO;  Location: [place];  Service: Plastics;  Laterality: Right;   SKIN SPLIT GRAFT N/A 10/03/2018   Procedure: Split thickness skin graft to gluteal area with acell placement;  Surgeon: [name], DO;  Location: [place];  Service: Plastics;  Laterality: N/A;  3 hours, please   VACUUM ASSISTED CLOSURE CHANGE N/A 07/12/2018   Procedure: ABDOMINAL VACUUM ASSISTED CLOSURE CHANGE and abdominal washout;  Surgeon: Georganna Skeans, MD;  Location: [facility];  Service: General;  Laterality: N/A;   WOUND DEBRIDEMENT Left 07/23/2018   Procedure: DEBRIDEMENT LEFT BUTTOCK  WOUND;  Surgeon: Georganna Skeans, MD;  Location: [place];  Service: General;  Laterality: Left;   WOUND EXPLORATION Left 07/10/2018    Procedure: WOUND EXPLORATION LEFT GROIN;  Surgeon: Clovis Riley, MD;  Location: [place];  Service: General;  Laterality: Left;    Social History:  reports that he quit smoking about 8 months ago. His smoking use included cigarettes. He has a 20.00 pack-year smoking history. He has never used smokeless tobacco. He reports that he does not drink alcohol or use drugs.  Family History:  Family History  Problem Relation Age of Onset   Breast cancer Mother        with mets to the bones     Prior to Admission medications   Medication Sig Start Date End Date Taking? Authorizing Provider  citalopram (CELEXA) 20 MG tablet Take 1 tablet (20 mg total) by mouth daily. For mood/anxiety 01/29/19 01/29/20 Yes Lovorn, Jinny Blossom, MD  clonazePAM (KLONOPIN) 0.5 MG tablet Take 0.5 tablets (0.25 mg total) by mouth 3 (three) times daily as needed (anxiety). 02/26/19  Yes Lovorn, Megan, MD  [facility] MELATONIN 3 MG TABS TAKE 1 TABLET (3 MG TOTAL)  BY MOUTH AT BEDTIME. 02/12/19  Yes Lovorn, Jinny Blossom, MD  Dextromethorphan-guaiFENesin (MUCUS RELIEF DM) 30-600 MG TB12 TAKE 1 TABLET BY MOUTH TWICE A DAY 02/01/19  Yes Vivi Barrack, MD  doxycycline (VIBRA-TABS) 100 MG tablet Take 1 tablet (100 mg total) by mouth 2 (two) times daily. 02/26/19  Yes Dillingham, Loel Lofty, DO  fentaNYL (DURAGESIC) 50 MCG/HR Place 1 patch onto the skin every other day. Since dose wearing off early 02/19/19 03/21/19 Yes Lovorn, Jinny Blossom, MD  gabapentin (NEURONTIN) 300 MG capsule TAKE 1 CAPSULE BY MOUTH THREE TIMES A DAY 02/12/19  Yes Lovorn, Megan, MD  methocarbamol (ROBAXIN) 500 MG tablet TAKE 1 TABLET BY MOUTH EVERY 6 HOURS AS NEEDED FOR MUSCLE SPASMS 02/01/19  Yes Lovorn, Jinny Blossom, MD  omeprazole (PRILOSEC) 20 MG capsule Take 20 mg by mouth daily.   Yes [provider]  oxyCODONE-acetaminophen (PERCOCET) 5-325 MG tablet Take 1 tablet by mouth every 4 (four) hours as needed for severe pain. Fill as of 8/262020- thank you 01/29/19 01/29/20 Yes Lovorn, Jinny Blossom, MD    polyethylene glycol (MIRALAX / GLYCOLAX) 17 g packet MIX AND TAKE 17 G BY MOUTH DAILY. 02/12/19  Yes Bayard Hugger, NP  prochlorperazine (COMPAZINE) 5 MG tablet Take 1-2 tablets (5-10 mg total) by mouth every 6 (six) hours as needed for nausea. 12/12/18  Yes Love, Ivan Anchors, PA-C  QUEtiapine (SEROQUEL) 50 MG tablet Take 1 tablet (50 mg total) by mouth at bedtime. 02/26/19  Yes Lovorn, Jinny Blossom, MD  senna-docusate (SENOKOT-S) 8.6-50 MG tablet Take 2 tablets by mouth at bedtime. 12/12/18  Yes Love, Ivan Anchors, PA-C  dronabinol (MARINOL) 2.5 MG capsule Take 1 capsule (2.5 mg total) by mouth 2 (two) times daily before a meal. X 1 week then 5 mg 2x/day before meals 02/26/19 03/28/19  Courtney Heys, MD    Physical Exam: Vitals:   02/28/19 1857 02/28/19 2000 02/28/19 2033 02/28/19 2145  BP:  115/81  121/79  Pulse:  90  94  Resp:  16  16  Temp: 97.7 F (36.5 C)  97.7 F (36.5 C)   TempSrc: Oral  Oral   SpO2:  94%  97%  Weight:      Height:       General: Not in acute distress.  Thin body habitus. HEENT:       Eyes: PERRL, EOMI, no scleral icterus.       ENT: No discharge from the ears and nose, no pharynx injection, no tonsillar enlargement.        Neck: No JVD, no bruit, no mass felt. Heme: No neck lymph node enlargement. Cardiac: S1/S2, RRR, No murmurs, No gallops or rubs. Respiratory:  No rales, wheezing, rhonchi or rubs. GI: Soft, nondistended, nontender, no rebound pain, no organomegaly, BS present. GU: No hematuria Ext: No pitting leg edema bilaterally. 2+DP/PT pulse bilaterally. Musculoskeletal: No joint deformities, No joint redness or warmth, no limitation of ROM in spin. Skin: No rashes.  has wound in left buttock area with drainage     Neuro: Alert, oriented X3, cranial nerves II-XII grossly intact, moves all extremities normally.  Psych: Patient is not psychotic, no suicidal or hemocidal ideation.  Labs on Admission: I have personally reviewed following labs and imaging  studies  CBC: Recent Labs  Lab 02/28/19 1357  WBC 5.8  NEUTROABS 4.3  HGB 12.5*  HCT 40.6  MCV 103.6*  PLT 242   Basic Metabolic Panel: Recent Labs  Lab 02/28/19 1357  NA 140  K 4.0  CL 104  CO2 23  GLUCOSE 111*  BUN 11  CREATININE 0.56*  CALCIUM 10.0   GFR: Estimated Creatinine Clearance: 107.4 mL/min (A) (by C-G formula based on SCr of 0.56 mg/dL (L)). Liver Function Tests: Recent Labs  Lab 02/28/19 1357  AST 21  ALT 16  ALKPHOS 130*  BILITOT 0.6  PROT 7.8  ALBUMIN 2.8*   No results for input(s): LIPASE, AMYLASE in the last 168 hours. No results for input(s): AMMONIA in the last 168 hours. Coagulation Profile: No results for input(s): INR, PROTIME in the last 168 hours. Cardiac Enzymes: No results for input(s): CKTOTAL, CKMB, CKMBINDEX, TROPONINI in the last 168 hours. BNP (last 3 results) No results for input(s): PROBNP in the last 8760 hours. HbA1C: No results for input(s): HGBA1C in the last 72 hours. CBG: No results for input(s): GLUCAP in the last 168 hours. Lipid Profile: No results for input(s): CHOL, HDL, LDLCALC, TRIG, CHOLHDL, LDLDIRECT in the last 72 hours. Thyroid Function Tests: No results for input(s): TSH, T4TOTAL, FREET4, T3FREE, THYROIDAB in the last 72 hours. Anemia Panel: No results for input(s): VITAMINB12, FOLATE, FERRITIN, TIBC, IRON, RETICCTPCT in the last 72 hours. Urine analysis:    Component Value Date/Time   COLORURINE YELLOW 11/18/2018 1326   APPEARANCEUR CLOUDY (A) 11/18/2018 1326   LABSPEC 1.012 11/18/2018 1326   PHURINE 7.0 11/18/2018 1326   GLUCOSEU NEGATIVE 11/18/2018 1326   HGBUR NEGATIVE 11/18/2018 1326   BILIRUBINUR NEGATIVE 11/18/2018 1326   [place] 11/18/2018 1326   PROTEINUR NEGATIVE 11/18/2018 1326   NITRITE NEGATIVE 11/18/2018 1326   LEUKOCYTESUR LARGE (A) 11/18/2018 1326   Sepsis Labs: '@LABRCNTIP' (procalcitonin:4,lacticidven:4) )No results found for this or any previous visit (from the past  240 hour(s)).   Radiological Exams on Admission: Ct Pelvis W Contrast  Result Date: 02/28/2019 CLINICAL DATA:  Retroperitoneal abscess EXAM: CT PELVIS WITH CONTRAST TECHNIQUE: Multidetector CT imaging of the pelvis was performed using the standard protocol following the bolus administration of intravenous contrast. CONTRAST:  148m OMNIPAQUE IOHEXOL 300 MG/ML  SOLN COMPARISON:  Radiograph 12/03/2018, 09/14/2018, CT 07/13/2018 FINDINGS: Urinary Tract: Small stone in the mid to lower pole of the right kidney. Incompletely visualized mild hydronephrosis right kidney. There is right hydroureter with mild urothelial enhancement. Suprapubic catheter in the bladder which is empty but thick-walled. Multiple coarse calcifications within the right posterior bladder measuring up to 1 cm in size. These are near the right UVJ. Distal left ureter is nonenlarged. Bowel: Left lower quadrant colostomy. No bowel wall thickening in the pelvis. Vascular/Lymphatic: Moderate aortic atherosclerosis. No aneurysm. Vascular coils in the region of right internal iliac vessels. No significantly enlarged lymph nodes. Reproductive:  Coarse prostate calcifications.  No masses Other: Prominent presacral soft tissue thickening, presumably due to residual surgical and post traumatic changes. Musculoskeletal: Numerous pelvic fractures are redemonstrated including comminuted right superior/pubic root and inferior pubic rami fractures, with interval development of callus. Comminuted left superior and inferior pubic rami fractures, also with interval callus formation. Residual mild widening of SI joints with single long screw fixation across both SI joints, this tip projects into the soft tissues lateral to the right iliac bone, and single fixating screw across the left SI joint. Suspicion of mild hardware loosening at the screw heads on the left side of the SI joint screws with separation of head of screw from ring fastener since prior CT.  Displaced left posterior sacral fracture without significant bony bridging. Interval increase in fragmented appearance of left iliac bone and fracture  fragments with heterogeneous lucency in dominant fracture fragment abutting the SI joint. Some sclerosis at the margins. Heterotopic bone formation in the left posterior hip soft tissues and gluteal region. Single fixating screw across the left superior pubic ramus fracture with tip terminating near the, some osteolytic change about the tip of the screw. Generalized osteopenia in the pelvis. Threaded screw fixation of left femoral neck fracture with some sclerosis at fracture margin but residual visibility of fracture lucency. Slight sclerosis at the left femoral head but no femoral head collapse. Residual soft tissue thickening involving the left psoas and iliacus muscles. Heterogeneous attenuation of the left gluteal and hip musculature with atrophic changes. Fluid and edema within the subcutaneous soft tissues of the left buttock and left lateral hip though without rim enhancing collection to suggest an abscess. Diffuse loss of subcutaneous fatty tissues at the proximal thigh anteriorly. Probable surface wound overlying the left posterior ilium. IMPRESSION: 1. Extensive bilateral left greater than right pelvic fractures with surgical hardware fixating the SI joints, proximal left femur, and left acetabulum and superior pubic ramus. Interval increased fragmentation of comminuted left iliac bone fracture with heterogeneous attenuation of dominant fracture fragment abutting the left SI joint, possibly reflecting chronic osteomyelitis. Suspicion of some hardware loosening of the SI joint fixating screws, as a gap is now seen between the screw heads and the proximal ring shaped fastener. Mild osteolytic changes about the tip of the left acetabular fixating screw at the pubic symphysis. 2. Heterogeneous soft tissue thickening, edema and probable fluid involving the left  gluteal and posterior and lateral hip soft tissues, but without rim enhancing organized soft tissue abscess. Interval atrophy of left gluteal and hip musculature. Loss of subcutaneous fatty tissues with probable wound overlying the left posterior sacrum and iliac bone. 3. Suprapubic catheter with thickened bladder wall. Interim finding of mild right hydronephrosis and hydroureter, there are multiple stones within the right posterior bladder near the UVJ. Urothelial enhancement on the right could reflect ascending urinary tract infection. Right kidney stone Electronically Signed   By: Donavan Foil M.D.   On: 02/28/2019 19:52   Dg Chest Portable 1 View  Result Date: 02/28/2019 CLINICAL DATA:  Fevers and soft tissue wound near the left hip EXAM: PORTABLE CHEST 1 VIEW COMPARISON:  11/19/2018 FINDINGS: Cardiac shadow is within normal limits. The lungs are well aerated bilaterally. No focal infiltrate or sizable effusion is seen. No soft tissue abnormality is noted. No bony abnormality is seen. IMPRESSION: No acute abnormality noted. Electronically Signed   By: Inez Catalina M.D.   On: 02/28/2019 20:50     EKG:  Not done in ED, will get one.   Assessment/Plan Principal Problem:   Wound infection, posttraumatic Active Problems:   Anxiety and depression   Wound of buttock   Colostomy status (HCC)   Chronic pain due to trauma   Hydronephrosis, right   GERD (gastroesophageal reflux disease)   Suprapubic catheter (HCC)   Protein-calorie malnutrition, moderate (HCC)  Wound infection, posttraumatic (Wound of buttock-Left):  No fever currently. No leukocytosis.  Clinically not septic.  Hemodynamically stable.  CT scan showed possible chronic osteomyelitis.  Patient failed outpatient antibiotic treatment.  - will admit to med-surg bed as inpt inpt - Empiric antimicrobial treatment with vancomycin and cefepime - PRN Zofran for nausea, morphine and Percocet for pain - Blood cultures x 2  - ESR and CRP -  wound care consult - may need to consult ortho and ID in AM  Anxiety and  depression: -continue home Celexa and Klonopin  Chronic pain due to trauma: -pain control as above  GERD: -Protonix  Hydronephrosis, right and kidney stone: CT showed mild right hydronephrosis and hydroureter stones visualized near the UVJ EDP discussed with Urology, Dr. Junious Silk. Since patient's renal function is normal, pt will be appropriate for outpatient follow-up with urology. -please let pt follow up with urology. -monitoring renal Fx by BMP  S/p of colostomy status ([place]) and Suprapubic catheter ([place]) -no acute issues.  Protein-calorie malnutrition, moderate: -start Ensur  Inpatient status:  # Patient requires inpatient status due to high intensity of service, high risk for further deterioration and high frequency of surveillance required.  I certify that at the point of admission it is my clinical judgment that the patient will require inpatient hospital care spanning beyond 2 midnights from the point of admission.   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  The worrisome physical exam findings include wound in the left buttock area with pus draining  The initial radiographic and laboratory data are worrisome because of possible osteomyelitis, kidney stone and the right mild hydronephrosis.  Current medical needs: please see my assessment and plan Predictability of an adverse outcome (risk): Patient has multiple comorbidities, now presents with chronic left buttock wound with infection.  CT scan showed possible osteomyelitis.  Patient failed outpatient antibiotic treatment.  His wound is very significant, may  need surgical treatment.  Patient is at high risk of deteriorating.  Will need to be treated in the hospital for at least 2 days.       DVT ppx: SCD Code Status: Full code Family Communication:  Yes, patient's wife   at bed side Disposition Plan:  Anticipate discharge back to previous home environment Consults called:  Urology, Dr. Junious Silk was consulted Admission status:   medical floor/inpt  Date of Service 02/28/2019    [facility]   If 7PM-7AM, please contact night-coverage [email or web address] Password [email or web address] 02/28/2019, 11:42 PM

## 2019-02-28 NOTE — ED Notes (Signed)
pts wife given supplies to change pts wound bandages.

## 2019-02-28 NOTE — Progress Notes (Signed)
Pharmacy Antibiotic Note  Christopher [name] is a 53 y.o. male admitted on 02/28/2019 with infection of unknown source.  Pharmacy has been consulted for vancomycin/cefepime dosing. Afebrile, WBC wnl, LA 1.6. SCr 0.56 on admit.  Plan: Cefepime 2g IV x 1; then 2g IV q8h Vancomycin 1500mg  IV x1; then Vancomycin 1250 mg IV Q 12 hrs. Goal AUC 400-550. Expected AUC: 528 SCr used: 0.8 Monitor clinical progress, c/s, renal function F/u de-escalation plan/LOT, vancomycin levels as indicated   Height: 6\' 3"  (190.5 cm) Weight: 155 lb (70.3 kg) IBW/kg (Calculated) : 84.5  Temp (24hrs), Avg:97.8 F (36.6 C), Min:97.7 F (36.5 C), Max:97.9 F (36.6 C)  Recent Labs  Lab 02/28/19 1357 02/28/19 1401  WBC 5.8  --   CREATININE 0.56*  --   LATICACIDVEN  --  1.6    Estimated Creatinine Clearance: 107.4 mL/min (A) (by C-G formula based on SCr of 0.56 mg/dL (L)).    No Active Allergies  Antimicrobials this admission: 9/17 vancomycin >>  9/17 cefepime >>   Dose adjustments this admission:   Microbiology results:   Elicia Lamp, PharmD, BCPS Clinical Pharmacist 02/28/2019 9:13 PM

## 2019-02-28 NOTE — ED Notes (Signed)
Pt refusing rectal temp due to wound and pain near rectum.

## 2019-02-28 NOTE — Telephone Encounter (Signed)
I have let Marlowe Kays PT know that he did go to the ED.

## 2019-02-28 NOTE — Telephone Encounter (Signed)
PT Fiance called to let us know patient is going to ER due to fever. They suspect it is due to the left hip wound infection.

## 2019-02-28 NOTE — Telephone Encounter (Signed)
Christopher [name] [facility] called to report the fever of 100.8 though patient is using tylenol. At the time she saw the [name] he refused to go to the ED but looks like by previous message from fiance they have changed their mind. [name] said they were unable to do therapy and he was having increased drainage from his hip wound.
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
## 2019-02-28 NOTE — Telephone Encounter (Signed)
Patient's fiance called stating patient is experiencing increasing pain. He is not eating or sleeping well due to his pain. She reports that his fever increases to the 100s when his pain medication wears off. She states that the patient is reluctant to go to the emergency department and would like advice on the best course of treatment.
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
## 2019-02-28 NOTE — Telephone Encounter (Signed)
OK_ I think that's a good plan for him to be seen. Let me know if you hear anything else. Thank you

## 2019-03-01 ENCOUNTER — Encounter (HOSPITAL_COMMUNITY): Payer: Self-pay

## 2019-03-01 ENCOUNTER — Other Ambulatory Visit: Payer: Self-pay

## 2019-03-01 LAB — BASIC METABOLIC PANEL
Anion gap: 9 (ref 5–15)
BUN: 13 mg/dL (ref 6–20)
CO2: 25 mmol/L (ref 22–32)
Calcium: 10.1 mg/dL (ref 8.9–10.3)
Chloride: 106 mmol/L (ref 98–111)
Creatinine, Ser: 0.64 mg/dL (ref 0.61–1.24)
GFR calc Af Amer: >60 mL/min (ref >60)
GFR calc non Af Amer: >60 mL/min (ref >60)
Glucose, Bld: 102 mg/dL — ABNORMAL HIGH (ref 70–99)
Potassium: 4.1 mmol/L (ref 3.5–5.1)
Sodium: 140 mmol/L (ref 135–145)

## 2019-03-01 LAB — CBC
HCT: 37.2 % — ABNORMAL LOW (ref 39.0–52.0)
Hemoglobin: 11.7 g/dL — ABNORMAL LOW (ref 13.0–17.0)
MCH: 32.6 pg (ref 26.0–34.0)
MCHC: 31.5 g/dL (ref 30.0–36.0)
MCV: 103.6 fL — ABNORMAL HIGH (ref 80.0–100.0)
Platelets: 372 10*3/uL (ref 150–400)
RBC: 3.59 MIL/uL — ABNORMAL LOW (ref 4.22–5.81)
RDW: 12.8 % (ref 11.5–15.5)
WBC: 6.1 10*3/uL (ref 4.0–10.5)
nRBC: 0 % (ref 0.0–0.2)

## 2019-03-01 LAB — URINALYSIS, ROUTINE W REFLEX MICROSCOPIC
Bilirubin Urine: NEGATIVE
Glucose, UA: NEGATIVE mg/dL
Hgb urine dipstick: NEGATIVE
Ketones, ur: 80 mg/dL — AB
Nitrite: NEGATIVE
Protein, ur: NEGATIVE mg/dL
Specific Gravity, Urine: 1.027 (ref 1.005–1.030)
WBC, UA: >50 WBC/hpf — ABNORMAL HIGH (ref 0–5)
pH: 5 (ref 5.0–8.0)

## 2019-03-01 LAB — SARS CORONAVIRUS 2 (TAT 6-24 HRS): SARS Coronavirus 2: NEGATIVE

## 2019-03-01 MED ORDER — ACETAMINOPHEN 325 MG PO TABS
650.0000 mg | ORAL_TABLET | Freq: Four times a day (QID) | ORAL | Status: DC | PRN
  Administered 2019-03-05: 650 mg via ORAL
  Filled 2019-03-01 (×2): qty 2

## 2019-03-01 MED ORDER — ONDANSETRON HCL 4 MG PO TABS
4.0000 mg | ORAL_TABLET | Freq: Four times a day (QID) | ORAL | Status: DC | PRN
  Administered 2019-03-01 – 2019-03-10 (×2): 4 mg via ORAL
  Filled 2019-03-01 (×3): qty 1

## 2019-03-01 MED ORDER — CLONAZEPAM 0.25 MG PO TBDP
0.2500 mg | ORAL_TABLET | Freq: Three times a day (TID) | ORAL | Status: DC | PRN
  Administered 2019-03-01 – 2019-03-11 (×14): 0.25 mg via ORAL
  Filled 2019-03-01 (×15): qty 1

## 2019-03-01 MED ORDER — HYDROMORPHONE HCL 1 MG/ML IJ SOLN
1.0000 mg | INTRAMUSCULAR | Status: DC | PRN
  Administered 2019-03-01 – 2019-03-02 (×6): 1 mg via INTRAVENOUS
  Filled 2019-03-01 (×5): qty 1

## 2019-03-01 MED ORDER — PANTOPRAZOLE SODIUM 40 MG PO TBEC
40.0000 mg | DELAYED_RELEASE_TABLET | Freq: Every day | ORAL | Status: DC
  Administered 2019-03-02 – 2019-03-12 (×11): 40 mg via ORAL
  Filled 2019-03-01 (×12): qty 1

## 2019-03-01 MED ORDER — CITALOPRAM HYDROBROMIDE 20 MG PO TABS
20.0000 mg | ORAL_TABLET | Freq: Every day | ORAL | Status: DC
  Administered 2019-03-02 – 2019-03-12 (×11): 20 mg via ORAL
  Filled 2019-03-01 (×12): qty 1

## 2019-03-01 MED ORDER — PROMETHAZINE HCL 25 MG/ML IJ SOLN
12.5000 mg | Freq: Four times a day (QID) | INTRAMUSCULAR | Status: DC | PRN
  Administered 2019-03-01 – 2019-03-07 (×4): 12.5 mg via INTRAVENOUS
  Filled 2019-03-01 (×5): qty 1

## 2019-03-01 MED ORDER — OXYCODONE-ACETAMINOPHEN 5-325 MG PO TABS
1.0000 | ORAL_TABLET | Freq: Four times a day (QID) | ORAL | Status: DC | PRN
  Administered 2019-03-01: 1 via ORAL
  Filled 2019-03-01: qty 1

## 2019-03-01 MED ORDER — METHOCARBAMOL 500 MG PO TABS
500.0000 mg | ORAL_TABLET | Freq: Three times a day (TID) | ORAL | Status: DC | PRN
  Administered 2019-03-02 – 2019-03-11 (×15): 500 mg via ORAL
  Filled 2019-03-01 (×17): qty 1

## 2019-03-01 MED ORDER — PROMETHAZINE HCL 25 MG/ML IJ SOLN
12.5000 mg | Freq: Once | INTRAMUSCULAR | Status: AC
  Administered 2019-03-01: 12.5 mg via INTRAVENOUS

## 2019-03-01 MED ORDER — ACETAMINOPHEN 650 MG RE SUPP: 650.0000 mg | Freq: Four times a day (QID) | RECTAL | Status: DC | PRN

## 2019-03-01 MED ORDER — SENNOSIDES-DOCUSATE SODIUM 8.6-50 MG PO TABS
2.0000 | ORAL_TABLET | Freq: Every day | ORAL | Status: DC
  Administered 2019-03-01 – 2019-03-11 (×9): 2 via ORAL
  Filled 2019-03-01 (×10): qty 2

## 2019-03-01 MED ORDER — ONDANSETRON HCL 4 MG/2ML IJ SOLN
4.0000 mg | Freq: Four times a day (QID) | INTRAMUSCULAR | Status: DC | PRN
  Administered 2019-03-01 – 2019-03-12 (×9): 4 mg via INTRAVENOUS
  Filled 2019-03-01 (×9): qty 2

## 2019-03-01 MED ORDER — QUETIAPINE FUMARATE 25 MG PO TABS
50.0000 mg | ORAL_TABLET | Freq: Every day | ORAL | Status: DC
  Administered 2019-03-01 – 2019-03-11 (×11): 50 mg via ORAL
  Filled 2019-03-01 (×7): qty 2
  Filled 2019-03-01: qty 1
  Filled 2019-03-01 (×3): qty 2

## 2019-03-01 MED ORDER — GABAPENTIN 300 MG PO CAPS
300.0000 mg | ORAL_CAPSULE | Freq: Three times a day (TID) | ORAL | Status: DC
  Administered 2019-03-01 – 2019-03-12 (×33): 300 mg via ORAL
  Filled 2019-03-01 (×35): qty 1

## 2019-03-01 MED ORDER — VANCOMYCIN HCL 10 G IV SOLR
1500.0000 mg | INTRAVENOUS | Status: DC
  Administered 2019-03-02 – 2019-03-05 (×4): 1500 mg via INTRAVENOUS
  Filled 2019-03-01 (×5): qty 1500

## 2019-03-01 MED ORDER — PROMETHAZINE HCL 25 MG/ML IJ SOLN
25.0000 mg | Freq: Once | INTRAMUSCULAR | Status: DC
  Filled 2019-03-01: qty 1

## 2019-03-01 MED ORDER — MELATONIN 3 MG PO TABS
3.0000 mg | ORAL_TABLET | Freq: Every day | ORAL | Status: DC
  Administered 2019-03-01 – 2019-03-11 (×11): 3 mg via ORAL
  Filled 2019-03-01 (×12): qty 1
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
## 2019-03-01 NOTE — Progress Notes (Signed)
Pt transferred from [facility] safely, foley and colostomy intact and patent. Pt reports pain but that pain meds did relieve some of it. Pain meds due again at 00:30. Call light within reach

## 2019-03-01 NOTE — Plan of Care (Signed)
High anxiety related to care in general.
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
## 2019-03-01 NOTE — Social Work (Signed)
CSW called by Butch Penny, liaison with [facility]. Pt active with [facility] for HHRN and [facility] continuing to follow for support with disposition when medically appropriate.  Westley Hummer, MSW, [place] Work [phone]

## 2019-03-01 NOTE — Telephone Encounter (Signed)
Called patient and he is on the way to the hospital for evaluation.//AB/CMA

## 2019-03-01 NOTE — Progress Notes (Signed)
Patient refuses to be turned at this time. He is lying on his right side. RN unable to assess right side at this time. Patient is agreeable to be turn once wound RN comes to assess. Left buttock wound is packed with iodoform with copius brown drainage. No odor noted.

## 2019-03-01 NOTE — ED Notes (Signed)
Pt had emesis episode. Pt diaphoretic, states he's "hot and cold at the same time". Temp 97.9 F oral. Pt refused rectal temperature. Pt was given zofran at [place], which has not seemed to help. VSS at this time, will continue to monitor.

## 2019-03-01 NOTE — Progress Notes (Signed)
Pharmacy Antibiotic Note  Christopher [name] is a 53 y.o. male admitted on 02/28/2019 with infection of unknown source. Covering for chronic left hip/gluteus wound and UTI. Has suprapubic catheter. Pharmacy has been consulted for Vancomycin and Cefepime dosing.  Dosing calculations on 9/17 pm were based on 155 lbs/ 70.3 kg.  Weight today is 60.8 kg.  Reweighed = 58.2 kg per bed weight.  Plan: Adjusted empiric Vancomycin regimen from 1250 mg IV q12hrs to 1500 mg IV q24hrs. Goal AUC 400-550. Expected AUC: 458 SCr used: 0.8 Continue Cefepime 2 gm IV q8hrs. Follow renal function, culture data, clinical progress and antibiotic plans. Vanc levels at steady state.  Height: 6\' 3"  (190.5 cm) Weight: 134 lb 0.6 oz (60.8 kg) IBW/kg (Calculated) : 84.5  Temp (24hrs), Avg:97.9 F (36.6 C), Min:97.7 F (36.5 C), Max:98.3 F (36.8 C)  Recent Labs  Lab 02/28/19 1357 02/28/19 1401 02/28/19 2055 03/01/19 0504  WBC 5.8  --   --  6.1  CREATININE 0.56*  --   --  0.64  LATICACIDVEN  --  1.6 1.5  --     Estimated Creatinine Clearance: 92.9 mL/min (by C-G formula based on SCr of 0.64 mg/dL).    No Active Allergies  Antimicrobials this admission:  Cefepime 9/17>>  Vancomycin 9/17  Dose adjustments this admission:  9/18: adjusted empiric Vanc regimen for updated (lower) body weight  Microbiology results:  9/17 blood x 2: sent  9/17 urine: sent  9/17 COVID: negative  Thank you for allowing pharmacy to be a part of this patient's care.  Arty Baumgartner, [place] Pager: [phone] or phone: [phone] 03/01/2019 4:58 PM
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
## 2019-03-01 NOTE — ED Notes (Signed)
Pt nauseous with dry heaves and pain 10/10

## 2019-03-01 NOTE — Progress Notes (Signed)
Provider paged for air mattress bed order. Pt has chronic left side buttocks wound.

## 2019-03-01 NOTE — Progress Notes (Signed)
Rn removed fentanyl patch from R. arm

## 2019-03-02 LAB — URINE CULTURE: Culture: <10000 — AB

## 2019-03-02 MED ORDER — OXYCODONE-ACETAMINOPHEN 5-325 MG PO TABS
1.0000 | ORAL_TABLET | ORAL | Status: DC | PRN
  Administered 2019-03-02 – 2019-03-12 (×32): 1 via ORAL
  Filled 2019-03-02 (×33): qty 1

## 2019-03-02 MED ORDER — INFLUENZA VAC SPLIT QUAD 0.5 ML IM SUSY
0.5000 mL | PREFILLED_SYRINGE | INTRAMUSCULAR | Status: AC
  Administered 2019-03-12: 19:00:00 0.5 mL via INTRAMUSCULAR
  Filled 2019-03-02 (×2): qty 0.5

## 2019-03-02 MED ORDER — HYDROMORPHONE HCL 1 MG/ML IJ SOLN
1.0000 mg | INTRAMUSCULAR | Status: DC | PRN
  Filled 2019-03-02: qty 1

## 2019-03-02 MED ORDER — PRO-STAT SUGAR FREE PO LIQD
30.0000 mL | Freq: Three times a day (TID) | ORAL | Status: DC
  Administered 2019-03-02 – 2019-03-12 (×28): 30 mL via ORAL
  Filled 2019-03-02 (×30): qty 30

## 2019-03-02 MED ORDER — HYDROMORPHONE HCL 1 MG/ML IJ SOLN
1.0000 mg | INTRAMUSCULAR | Status: DC | PRN
  Administered 2019-03-02 – 2019-03-03 (×6): 1 mg via INTRAVENOUS
  Filled 2019-03-02 (×5): qty 1

## 2019-03-02 MED ORDER — ENSURE ENLIVE PO LIQD
237.0000 mL | Freq: Two times a day (BID) | ORAL | Status: DC
  Administered 2019-03-05 (×2): 237 mL via ORAL

## 2019-03-02 MED ORDER — ENOXAPARIN SODIUM 40 MG/0.4ML ~~LOC~~ SOLN
40.0000 mg | SUBCUTANEOUS | Status: DC
  Administered 2019-03-02 – 2019-03-12 (×10): 40 mg via SUBCUTANEOUS
  Filled 2019-03-02 (×10): qty 0.4

## 2019-03-02 MED ORDER — ADULT MULTIVITAMIN W/MINERALS CH
1.0000 | ORAL_TABLET | Freq: Every day | ORAL | Status: DC
  Administered 2019-03-02 – 2019-03-12 (×11): 1 via ORAL
  Filled 2019-03-02 (×11): qty 1

## 2019-03-02 MED ORDER — GUAIFENESIN ER 600 MG PO TB12
600.0000 mg | ORAL_TABLET | Freq: Two times a day (BID) | ORAL | Status: DC | PRN
  Administered 2019-03-02 – 2019-03-10 (×8): 600 mg via ORAL
  Filled 2019-03-02 (×8): qty 1

## 2019-03-02 NOTE — Progress Notes (Signed)
Pt rested quietly t/o the night with no c/o pain until around 0430 this morning when he woke up crying in pain. Dilaudid given, drsg on L hip is C/D/I with no signs of drainage
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
## 2019-03-03 MED ORDER — HYDROMORPHONE HCL 1 MG/ML IJ SOLN
0.5000 mg | INTRAMUSCULAR | Status: DC | PRN
  Administered 2019-03-03 – 2019-03-11 (×28): 0.5 mg via INTRAVENOUS
  Filled 2019-03-03 (×28): qty 1

## 2019-03-03 MED ORDER — NALOXONE HCL 0.4 MG/ML IJ SOLN: 0.4000 mg | INTRAMUSCULAR | Status: DC | PRN

## 2019-03-03 MED ORDER — FENTANYL 50 MCG/HR TD PT72
1.0000 | MEDICATED_PATCH | TRANSDERMAL | Status: DC
  Administered 2019-03-03 – 2019-03-11 (×5): 1 via TRANSDERMAL
  Filled 2019-03-03 (×6): qty 1

## 2019-03-03 NOTE — Progress Notes (Signed)
PT Cancellation Note  Patient Details Name: Christopher [name] MRN: [id number] DOB: 1965/07/16   Cancelled Treatment:    Reason Eval/Treat Not Completed: Pain limiting ability to participate;Other (comment).  Pt is having a great deal of pain, anxiety related to pain and is being medicated closely by MD.  In spite of meds, has not been able to move well. Will retry pt after his procedure to remove some hardware tomorrow as this is expected to increase comfort level.  Follow up as pt can allow.   Ramond Dial 03/03/2019, 11:54 AM   Mee Hives, PT MS Acute Rehab Dept. Number: [place] [facility] [place]
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
## 2019-03-04 ENCOUNTER — Inpatient Hospital Stay (HOSPITAL_COMMUNITY): Payer: No Typology Code available for payment source

## 2019-03-04 ENCOUNTER — Encounter (HOSPITAL_COMMUNITY)
Admission: EM | Disposition: A | Discharge status: Home Health | Payer: Self-pay | Source: Home / Self Care | Attending: Internal Medicine

## 2019-03-04 DIAGNOSIS — S71002A Unspecified open wound, left hip, initial encounter: Secondary | ICD-10-CM

## 2019-03-04 HISTORY — PX: DEBRIDEMENT AND CLOSURE WOUND: SHX5614

## 2019-03-04 HISTORY — PX: HARDWARE REMOVAL: SHX979

## 2019-03-04 LAB — COMPREHENSIVE METABOLIC PANEL
ALT: 12 U/L (ref 0–44)
AST: 14 U/L — ABNORMAL LOW (ref 15–41)
Albumin: 2.6 g/dL — ABNORMAL LOW (ref 3.5–5.0)
Alkaline Phosphatase: 99 U/L (ref 38–126)
Anion gap: 10 (ref 5–15)
BUN: 11 mg/dL (ref 6–20)
CO2: 23 mmol/L (ref 22–32)
Calcium: 9.8 mg/dL (ref 8.9–10.3)
Chloride: 112 mmol/L — ABNORMAL HIGH (ref 98–111)
Creatinine, Ser: 0.49 mg/dL — ABNORMAL LOW (ref 0.61–1.24)
GFR calc Af Amer: >60 mL/min (ref >60)
GFR calc non Af Amer: >60 mL/min (ref >60)
Glucose, Bld: 88 mg/dL (ref 70–99)
Potassium: 3 mmol/L — ABNORMAL LOW (ref 3.5–5.1)
Sodium: 145 mmol/L (ref 135–145)
Total Bilirubin: 0.4 mg/dL (ref 0.3–1.2)
Total Protein: 7.1 g/dL (ref 6.5–8.1)

## 2019-03-04 LAB — CBC WITH DIFFERENTIAL/PLATELET
Abs Immature Granulocytes: 0.02 10*3/uL (ref 0.00–0.07)
Basophils Absolute: 0 10*3/uL (ref 0.0–0.1)
Basophils Relative: 1 %
Eosinophils Absolute: 0.4 10*3/uL (ref 0.0–0.5)
Eosinophils Relative: 8 %
HCT: 36.9 % — ABNORMAL LOW (ref 39.0–52.0)
Hemoglobin: 11.8 g/dL — ABNORMAL LOW (ref 13.0–17.0)
Immature Granulocytes: 0 %
Lymphocytes Relative: 27 %
Lymphs Abs: 1.5 10*3/uL (ref 0.7–4.0)
MCH: 32.6 pg (ref 26.0–34.0)
MCHC: 32 g/dL (ref 30.0–36.0)
MCV: 101.9 fL — ABNORMAL HIGH (ref 80.0–100.0)
Monocytes Absolute: 0.5 10*3/uL (ref 0.1–1.0)
Monocytes Relative: 9 %
Neutro Abs: 3 10*3/uL (ref 1.7–7.7)
Neutrophils Relative %: 55 %
Platelets: 329 10*3/uL (ref 150–400)
RBC: 3.62 MIL/uL — ABNORMAL LOW (ref 4.22–5.81)
RDW: 13.2 % (ref 11.5–15.5)
WBC: 5.4 10*3/uL (ref 4.0–10.5)
nRBC: 0 % (ref 0.0–0.2)

## 2019-03-04 SURGERY — DEBRIDEMENT, WOUND, WITH CLOSURE
Anesthesia: General | Site: Hip | Laterality: Left

## 2019-03-04 MED ORDER — LIDOCAINE 2% (20 MG/ML) 5 ML SYRINGE
INTRAMUSCULAR | Status: AC
  Filled 2019-03-04: qty 5

## 2019-03-04 MED ORDER — 0.9 % SODIUM CHLORIDE (POUR BTL) OPTIME
TOPICAL | Status: DC | PRN
  Administered 2019-03-04: 1000 mL

## 2019-03-04 MED ORDER — POTASSIUM CHLORIDE CRYS ER 20 MEQ PO TBCR: 40.0000 meq | EXTENDED_RELEASE_TABLET | Freq: Once | ORAL | Status: DC

## 2019-03-04 MED ORDER — SURGILUBE EX GEL
CUTANEOUS | Status: DC | PRN
  Administered 2019-03-04: 1 via TOPICAL

## 2019-03-04 MED ORDER — CEFAZOLIN SODIUM-DEXTROSE 2-4 GM/100ML-% IV SOLN
2.0000 g | INTRAVENOUS | Status: AC
  Administered 2019-03-04: 2 g via INTRAVENOUS
  Filled 2019-03-04: qty 100

## 2019-03-04 MED ORDER — HYDROMORPHONE HCL 1 MG/ML IJ SOLN
INTRAMUSCULAR | Status: AC
  Filled 2019-03-04: qty 1

## 2019-03-04 MED ORDER — ONDANSETRON HCL 4 MG/2ML IJ SOLN
INTRAMUSCULAR | Status: DC | PRN
  Administered 2019-03-04: 4 mg via INTRAVENOUS

## 2019-03-04 MED ORDER — HYDROMORPHONE HCL 1 MG/ML IJ SOLN
0.2500 mg | INTRAMUSCULAR | Status: DC | PRN
  Administered 2019-03-04 (×4): 0.5 mg via INTRAVENOUS

## 2019-03-04 MED ORDER — FENTANYL CITRATE (PF) 250 MCG/5ML IJ SOLN
INTRAMUSCULAR | Status: AC
  Filled 2019-03-04: qty 5

## 2019-03-04 MED ORDER — LIDOCAINE-EPINEPHRINE 1 %-1:100000 IJ SOLN
INTRAMUSCULAR | Status: AC
  Filled 2019-03-04: qty 1

## 2019-03-04 MED ORDER — SUGAMMADEX SODIUM 200 MG/2ML IV SOLN
INTRAVENOUS | Status: DC | PRN
  Administered 2019-03-04: 150 mg via INTRAVENOUS

## 2019-03-04 MED ORDER — TOBRAMYCIN SULFATE 1.2 G IJ SOLR
INTRAMUSCULAR | Status: DC | PRN
  Administered 2019-03-04: 1.2 g via TOPICAL

## 2019-03-04 MED ORDER — PHENYLEPHRINE 40 MCG/ML (10ML) SYRINGE FOR IV PUSH (FOR BLOOD PRESSURE SUPPORT)
PREFILLED_SYRINGE | INTRAVENOUS | Status: DC | PRN
  Administered 2019-03-04 (×5): 80 ug via INTRAVENOUS

## 2019-03-04 MED ORDER — TOBRAMYCIN SULFATE 1.2 G IJ SOLR
INTRAMUSCULAR | Status: AC
  Filled 2019-03-04: qty 1.2

## 2019-03-04 MED ORDER — MIDAZOLAM HCL 5 MG/5ML IJ SOLN
INTRAMUSCULAR | Status: DC | PRN
  Administered 2019-03-04 (×2): 1 mg via INTRAVENOUS

## 2019-03-04 MED ORDER — ROCURONIUM BROMIDE 100 MG/10ML IV SOLN
INTRAVENOUS | Status: DC | PRN
  Administered 2019-03-04: 50 mg via INTRAVENOUS
  Administered 2019-03-04: 10 mg via INTRAVENOUS

## 2019-03-04 MED ORDER — CHLORHEXIDINE GLUCONATE CLOTH 2 % EX PADS
6.0000 | MEDICATED_PAD | Freq: Once | CUTANEOUS | Status: AC
  Administered 2019-03-04: 6 via TOPICAL

## 2019-03-04 MED ORDER — SODIUM CHLORIDE 0.9 % IV BOLUS
500.0000 mL | Freq: Once | INTRAVENOUS | Status: AC
  Administered 2019-03-04: 500 mL via INTRAVENOUS

## 2019-03-04 MED ORDER — LIDOCAINE-EPINEPHRINE 1 %-1:100000 IJ SOLN
INTRAMUSCULAR | Status: DC | PRN
  Administered 2019-03-04: 20 mL

## 2019-03-04 MED ORDER — PHENYLEPHRINE 40 MCG/ML (10ML) SYRINGE FOR IV PUSH (FOR BLOOD PRESSURE SUPPORT)
PREFILLED_SYRINGE | INTRAVENOUS | Status: AC
  Filled 2019-03-04: qty 10

## 2019-03-04 MED ORDER — SODIUM CHLORIDE 0.9 % IV SOLN
INTRAVENOUS | Status: AC
  Filled 2019-03-04: qty 500000

## 2019-03-04 MED ORDER — DEXAMETHASONE SODIUM PHOSPHATE 10 MG/ML IJ SOLN
INTRAMUSCULAR | Status: DC | PRN
  Administered 2019-03-04: 4 mg via INTRAVENOUS

## 2019-03-04 MED ORDER — VANCOMYCIN HCL 1000 MG IV SOLR
INTRAVENOUS | Status: AC
  Filled 2019-03-04: qty 1000

## 2019-03-04 MED ORDER — MIDAZOLAM HCL 2 MG/2ML IJ SOLN
INTRAMUSCULAR | Status: AC
  Filled 2019-03-04: qty 2

## 2019-03-04 MED ORDER — FENTANYL CITRATE (PF) 250 MCG/5ML IJ SOLN
INTRAMUSCULAR | Status: DC | PRN
  Administered 2019-03-04: 50 ug via INTRAVENOUS
  Administered 2019-03-04 (×2): 25 ug via INTRAVENOUS
  Administered 2019-03-04 (×2): 50 ug via INTRAVENOUS

## 2019-03-04 MED ORDER — PROPOFOL 10 MG/ML IV BOLUS
INTRAVENOUS | Status: DC | PRN
  Administered 2019-03-04: 80 mg via INTRAVENOUS
  Administered 2019-03-04: 40 mg via INTRAVENOUS

## 2019-03-04 MED ORDER — DEXAMETHASONE SODIUM PHOSPHATE 10 MG/ML IJ SOLN
INTRAMUSCULAR | Status: AC
  Filled 2019-03-04: qty 1

## 2019-03-04 MED ORDER — SUCCINYLCHOLINE CHLORIDE 200 MG/10ML IV SOSY
PREFILLED_SYRINGE | INTRAVENOUS | Status: AC
  Filled 2019-03-04: qty 10

## 2019-03-04 MED ORDER — MAGNESIUM SULFATE 2 GM/50ML IV SOLN
2.0000 g | Freq: Once | INTRAVENOUS | Status: AC
  Administered 2019-03-04: 2 g via INTRAVENOUS
  Filled 2019-03-04: qty 50

## 2019-03-04 MED ORDER — LACTATED RINGERS IV SOLN
INTRAVENOUS | Status: DC
  Administered 2019-03-04 (×2): via INTRAVENOUS

## 2019-03-04 MED ORDER — STERILE WATER FOR IRRIGATION IR SOLN
Status: DC | PRN
  Administered 2019-03-04: 1000 mL

## 2019-03-04 MED ORDER — EPHEDRINE 5 MG/ML INJ
INTRAVENOUS | Status: AC
  Filled 2019-03-04: qty 10

## 2019-03-04 MED ORDER — ROCURONIUM BROMIDE 10 MG/ML (PF) SYRINGE
PREFILLED_SYRINGE | INTRAVENOUS | Status: AC
  Filled 2019-03-04: qty 10

## 2019-03-04 MED ORDER — LIDOCAINE 2% (20 MG/ML) 5 ML SYRINGE
INTRAMUSCULAR | Status: DC | PRN
  Administered 2019-03-04: 60 mg via INTRAVENOUS

## 2019-03-04 MED ORDER — ONDANSETRON HCL 4 MG/2ML IJ SOLN
INTRAMUSCULAR | Status: AC
  Filled 2019-03-04: qty 2

## 2019-03-04 MED ORDER — VANCOMYCIN HCL 1000 MG IV SOLR
INTRAVENOUS | Status: DC | PRN
  Administered 2019-03-04: 1000 mg via TOPICAL

## 2019-03-04 MED ORDER — PROPOFOL 10 MG/ML IV BOLUS
INTRAVENOUS | Status: AC
  Filled 2019-03-04: qty 20

## 2019-03-04 MED ORDER — POTASSIUM CHLORIDE 10 MEQ/100ML IV SOLN
10.0000 meq | INTRAVENOUS | Status: AC
  Administered 2019-03-04 (×4): 10 meq via INTRAVENOUS
  Filled 2019-03-04 (×4): qty 100

## 2019-03-04 SURGICAL SUPPLY — 40 items
CANISTER WOUND CARE 500ML ATS (WOUND CARE) ×2 IMPLANT
CONT SPEC 4OZ CLIKSEAL STRL BL (MISCELLANEOUS) ×3 IMPLANT
COVER SURGICAL LIGHT HANDLE (MISCELLANEOUS) ×4 IMPLANT
DRAPE C-ARM 42X72 X-RAY (DRAPES) ×1 IMPLANT
DRAPE C-ARMOR (DRAPES) ×2 IMPLANT
DRAPE IMP U-DRAPE 54X76 (DRAPES) ×2 IMPLANT
DRAPE ORTHO SPLIT 77X108 STRL (DRAPES) ×1
DRAPE SURG ORHT 6 SPLT 77X108 (DRAPES) IMPLANT
DRSG CUTIMED SORBACT 7X9 (GAUZE/BANDAGES/DRESSINGS) ×2 IMPLANT
DRSG PAD ABDOMINAL 8X10 ST (GAUZE/BANDAGES/DRESSINGS) ×1 IMPLANT
ELECT CAUTERY BLADE 6.4 (BLADE) ×2 IMPLANT
ELECT REM PT RETURN 9FT ADLT (ELECTROSURGICAL) ×2
ELECTRODE REM PT RTRN 9FT ADLT (ELECTROSURGICAL) ×1 IMPLANT
GAUZE SPONGE 4X4 12PLY STRL (GAUZE/BANDAGES/DRESSINGS) ×2 IMPLANT
GEL ULTRASOUND 20GR AQUASONIC (MISCELLANEOUS) IMPLANT
GLOVE BIO SURGEON STRL SZ 6.5 (GLOVE) ×8 IMPLANT
GLOVE BIO SURGEON STRL SZ7 (GLOVE) ×1 IMPLANT
GLOVE BIO SURGEON STRL SZ7.5 (GLOVE) ×8 IMPLANT
GLOVE BIOGEL PI IND STRL 7.5 (GLOVE) ×1 IMPLANT
GLOVE BIOGEL PI INDICATOR 7.5 (GLOVE) ×1
GOWN STRL REUS W/ TWL LRG LVL3 (GOWN DISPOSABLE) ×5 IMPLANT
GOWN STRL REUS W/TWL LRG LVL3 (GOWN DISPOSABLE) ×5
KIT BASIN OR (CUSTOM PROCEDURE TRAY) ×4 IMPLANT
KIT TURNOVER KIT B (KITS) ×2 IMPLANT
MATRIX WOUND 3-LAYER 5X5 (Tissue) ×1 IMPLANT
MICROMATRIX 1000MG (Tissue) ×2 IMPLANT
NS IRRIG 1000ML POUR BTL (IV SOLUTION) ×2 IMPLANT
PACK GENERAL/GYN (CUSTOM PROCEDURE TRAY) ×2 IMPLANT
PAD ARMBOARD 7.5X6 YLW CONV (MISCELLANEOUS) ×4 IMPLANT
SOLUTION PARTIC MCRMTRX 1000MG (Tissue) IMPLANT
SPONGE LAP 18X18 RF (DISPOSABLE) ×2 IMPLANT
SUT VIC AB 5-0 PS2 18 (SUTURE) ×2 IMPLANT
SWAB CULTURE ESWAB REG 1ML (MISCELLANEOUS) ×1 IMPLANT
SYR CONTROL 10ML LL (SYRINGE) ×1 IMPLANT
TAPE CLOTH SURG 4X10 WHT LF (GAUZE/BANDAGES/DRESSINGS) ×1 IMPLANT
TOWEL GREEN STERILE (TOWEL DISPOSABLE) ×4 IMPLANT
TOWEL GREEN STERILE FF (TOWEL DISPOSABLE) ×3 IMPLANT
TUBE CONNECTING 12X1/4 (SUCTIONS) ×4 IMPLANT
UNDERPAD 30X30 (UNDERPADS AND DIAPERS) ×2 IMPLANT
WATER STERILE IRR 1000ML POUR (IV SOLUTION) ×3 IMPLANT
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
## 2019-03-04 NOTE — Progress Notes (Signed)
OT Cancellation Note  Patient Details Name: BERTHOLD GLACE MRN: [id number] DOB: August 06, 1965   Cancelled Treatment:    Reason Eval/Treat Not Completed: Patient at procedure or test/ unavailable(in OR). OT will continue to follow acutely for eval as schedule allows  Jaci Carrel 03/04/2019, 12:45 PM   Hulda Humphrey OTR/L [place] Pager: [phone] Office: [phone]
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
## 2019-03-04 NOTE — Interval H&P Note (Signed)
History and Physical Interval Note:  03/04/2019 11:13 AM  Christopher [name]  has presented today for surgery, with the diagnosis of Wound Of Buttock.  The various methods of treatment have been discussed with the patient and family. After consideration of risks, benefits and other options for treatment, the patient has consented to  Procedure(s): Excision of hip wound with placement of Acell and possible flap (Left) Left Hip Hardware Removal (Left) as a surgical intervention.  The patient's history has been reviewed, patient examined, no change in status, stable for surgery.  I have reviewed the patient's chart and labs.  Questions were answered to the patient's satisfaction.     Loel Lofty Yoav Okane
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
## 2019-03-04 NOTE — Anesthesia Procedure Notes (Signed)
Procedure Name: Intubation Date/Time: 03/04/2019 1:01 PM Performed by: Janene Harvey, CRNA Pre-anesthesia Checklist: Patient identified, Emergency Drugs available, Suction available and Patient being monitored Patient Re-evaluated:Patient Re-evaluated prior to induction Oxygen Delivery Method: Circle system utilized Preoxygenation: Pre-oxygenation with 100% oxygen Induction Type: IV induction Ventilation: Mask ventilation without difficulty Laryngoscope Size: Mac and 4 Grade View: Grade II Tube type: Oral Tube size: 6.5 mm Number of attempts: 1 Airway Equipment and Method: Stylet and Oral airway Placement Confirmation: ETT inserted through vocal cords under direct vision,  positive ETCO2 and breath sounds checked- equal and bilateral Secured at: 22 cm Tube secured with: Tape Dental Injury: Teeth and Oropharynx as per pre-operative assessment  Comments: Pt with recent hx of trach. Stoma healed. 6.5 ett easily passed without resistance.
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
## 2019-03-04 NOTE — Transfer of Care (Signed)
Immediate Anesthesia Transfer of Care Note  Patient: Christopher [name]  Procedure(s) Performed: Excision of hip wound with placement of Acell (Left Hip) Left Hip Hardware Removal (Left Hip)  Patient Location: PACU  Anesthesia Type:General  Level of Consciousness: drowsy  Airway & Oxygen Therapy: Patient Spontanous Breathing and Patient connected to face mask oxygen  Post-op Assessment: Report given to RN and Post -op Vital signs reviewed and stable  Post vital signs: Reviewed  Last Vitals:  Vitals Value Taken Time  BP 148/90 03/04/19 1430  Temp    Pulse 86 03/04/19 1441  Resp 14 03/04/19 1441  SpO2 100 % 03/04/19 1441  Vitals shown include unvalidated device data.  Last Pain:  Vitals:   03/04/19 0902  TempSrc:   PainSc: 2       Patients Stated Pain Goal: 3 (67/54/49 2010)  Complications: No apparent anesthesia complications

## 2019-03-04 NOTE — Progress Notes (Signed)
PT Cancellation Note  Patient Details Name: BRAYEN BUNN MRN: [id number] DOB: [birth year]   Cancelled Treatment:    Reason Eval/Treat Not Completed: Patient at procedure or test/unavailable Pt awaiting procedure. Will follow up following procedure and as schedule allows.   Leighton Ruff, PT, DPT  Acute Rehabilitation Services  Pager: [phone] Office: [phone]    Rudean Hitt 03/04/2019, 10:25 AM

## 2019-03-05 ENCOUNTER — Encounter (HOSPITAL_COMMUNITY): Payer: Self-pay | Admitting: Plastic Surgery

## 2019-03-05 LAB — BASIC METABOLIC PANEL
Anion gap: 8 (ref 5–15)
BUN: 9 mg/dL (ref 6–20)
CO2: 24 mmol/L (ref 22–32)
Calcium: 8.8 mg/dL — ABNORMAL LOW (ref 8.9–10.3)
Chloride: 106 mmol/L (ref 98–111)
Creatinine, Ser: 0.68 mg/dL (ref 0.61–1.24)
GFR calc Af Amer: >60 mL/min (ref >60)
GFR calc non Af Amer: >60 mL/min (ref >60)
Glucose, Bld: 123 mg/dL — ABNORMAL HIGH (ref 70–99)
Potassium: 3.4 mmol/L — ABNORMAL LOW (ref 3.5–5.1)
Sodium: 138 mmol/L (ref 135–145)

## 2019-03-05 LAB — CBC
HCT: 33.8 % — ABNORMAL LOW (ref 39.0–52.0)
Hemoglobin: 10.4 g/dL — ABNORMAL LOW (ref 13.0–17.0)
MCH: 31.7 pg (ref 26.0–34.0)
MCHC: 30.8 g/dL (ref 30.0–36.0)
MCV: 103 fL — ABNORMAL HIGH (ref 80.0–100.0)
Platelets: 313 10*3/uL (ref 150–400)
RBC: 3.28 MIL/uL — ABNORMAL LOW (ref 4.22–5.81)
RDW: 13.5 % (ref 11.5–15.5)
WBC: 8 10*3/uL (ref 4.0–10.5)
nRBC: 0 % (ref 0.0–0.2)

## 2019-03-05 LAB — CULTURE, BLOOD (ROUTINE X 2)
Culture: NO GROWTH
Culture: NO GROWTH

## 2019-03-05 MED ORDER — POTASSIUM CHLORIDE CRYS ER 20 MEQ PO TBCR
40.0000 meq | EXTENDED_RELEASE_TABLET | Freq: Once | ORAL | Status: AC
  Administered 2019-03-05: 40 meq via ORAL
  Filled 2019-03-05: qty 2

## 2019-03-05 MED ORDER — POTASSIUM CHLORIDE 10 MEQ/100ML IV SOLN
10.0000 meq | INTRAVENOUS | Status: DC
  Filled 2019-03-05: qty 100

## 2019-03-05 NOTE — Progress Notes (Signed)
OT Cancellation Note  Patient Details Name: Christopher [name] MRN: [id number] DOB: Dec 12, 1965   Cancelled Treatment:    Reason Eval/Treat Not Completed: Other (comment)(Pt nauseous and has significant headache, declined therapy) Headache has been unresolved despite pain medication. Will continue to follow and reattempt evaluation as time allows and pt is appropriate.   Dorinda Hill OTR/L Acute Rehabilitation Services Office: [place] 03/05/2019, 3:12 PM

## 2019-03-05 NOTE — Progress Notes (Signed)
Orthopaedic Trauma Progress Note  S: Patient with headache this AM. Continues to have pain in posterior hip and pelvis, as expected. No other major concerns or questions this morning.   O:  Vitals:   03/05/19 0335 03/05/19 0747  BP: 114/72 106/62  Pulse: (!) 113 (!) 106  Resp: 17 17  Temp: 98.4 F (36.9 C) 98 F (36.7 C)  SpO2: 99% 96%    General - Laying in bed, NAD. Cool washcloth over forehead  Pelvis/Left Lower Extremity - Dressing in place over hip is clean, dry, intact. Limited range of motion of hip secondary to pain. Sensation intact distally. Able to wiggle toes some. 2+ DP pulse. Skin warm and dry.  Imaging: Stable   Labs:  Results for orders placed or performed during the hospital encounter of 02/28/19 (from the past 24 hour(s))  Aerobic/Anaerobic Culture (surgical/deep wound)     Status: None (Preliminary result)   Collection Time: 03/04/19  1:30 PM   Specimen: Wound; Tissue  Result Value Ref Range   Specimen Description TISSUE WOUND LEFT PELVIS    Special Requests NONE    Gram Stain      RARE WBC PRESENT,BOTH PMN AND MONONUCLEAR FEW GRAM POSITIVE COCCI RARE GRAM VARIABLE ROD Performed at [facility], [address], [place], [place] [address]    Culture PENDING    Report Status PENDING   Aerobic/Anaerobic Culture (surgical/deep wound)     Status: None (Preliminary result)   Collection Time: 03/04/19  1:40 PM   Specimen: Wound  Result Value Ref Range   Specimen Description WOUND LEFT PELVIS    Special Requests SWAB SPEC B    Gram Stain      NO WBC SEEN RARE GRAM VARIABLE ROD Performed at [facility], [address], [place], [place] [address]    Culture PENDING    Report Status PENDING   Basic metabolic panel     Status: Abnormal   Collection Time: 03/05/19  3:59 AM  Result Value Ref Range   Sodium 138 135 - 145 mmol/L   Potassium 3.4 (L) 3.5 - 5.1 mmol/L   Chloride 106 98 - 111 mmol/L   CO2 24 22 - 32 mmol/L   Glucose, Bld 123 (H)  70 - 99 mg/dL   BUN 9 6 - 20 mg/dL   Creatinine, Ser 7.89 0.61 - 1.24 mg/dL   Calcium 8.8 (L) 8.9 - 10.3 mg/dL   GFR calc non Af Amer >60 >60 mL/min   GFR calc Af Amer >60 >60 mL/min   Anion gap 8 5 - 15  CBC     Status: Abnormal   Collection Time: 03/05/19  5:06 AM  Result Value Ref Range   WBC 8.0 4.0 - 10.5 K/uL   RBC 3.28 (L) 4.22 - 5.81 MIL/uL   Hemoglobin 10.4 (L) 13.0 - 17.0 g/dL   HCT 38.1 (L) 01.7 - 51.0 %   MCV 103.0 (H) 80.0 - 100.0 fL   MCH 31.7 26.0 - 34.0 pg   MCHC 30.8 30.0 - 36.0 g/dL   RDW 25.8 52.7 - 78.2 %   Platelets 313 150 - 400 K/uL   nRBC 0.0 0.0 - 0.2 %    Assessment: 53 year old male with severe pelvic ring injury and degloving injury of his pelvis s/p percutaneous fixation 07/12/18  Injuries: 1. Complex pelvic ring injury with severe soft tissue injury s/p removal of hardware 03/04/19 2. Draining posterior pelvic wound/Chronic osteomyelitis s/p removal of superficial  bone 03/04/19   Weightbearing: WBAT LLE  Insicional and dressing care: per plastic surgery   Orthopedic device(s): None  CV/Blood loss: Acute blood loss anemia, Hgb 10.4 this morning. Slightly tachycardic, likely secondary to pain  Pain management: per primary team  VTE prophylaxis: Lovenox  ID: per ID  Foley/Lines: suprapubic cath, continue IVFs per primary team  Dispo: Will continue to follow while in hospital, PT/OT eval  Follow - up plan: 2 weeks    Jakhiya Brower A. [facility] ?[phone] (phone)

## 2019-03-05 NOTE — Progress Notes (Signed)
1 Day Post-Op  Subjective: Patient in bed this AM with washcloth over forehead due to headache.   Pain in posterior hip. No concerns at this time or questions.   Objective: Vital signs in last 24 hours: Temp:  [98 F (36.7 C)-100 F (37.8 C)] 98.8 F (37.1 C) (09/22 1520) Pulse Rate:  [104-120] 105 (09/22 1520) Resp:  [17-20] 20 (09/22 1520) BP: (103-119)/(57-78) 119/78 (09/22 1520) SpO2:  [93 %-100 %] 97 % (09/22 1520) Last BM Date: 03/04/19  Intake/Output from previous day: 09/21 0701 - 09/22 0700 In: 2874.5 [I.V.:976.1; IV Piggyback:1898.5] Out: 2150 [Urine:2100; Blood:50] Intake/Output this shift: Total I/O In: 240 [P.O.:240] Out: -   General: patient resting in bed, with headache and cool washcloth over forehead. Left hip wound: Sorbact in place. Dressing in place. No surrounding erythema.   Lab Results:  CBC Latest Ref Rng & Units 03/05/2019 03/04/2019 03/01/2019  WBC 4.0 - 10.5 K/uL 8.0 5.4 6.1  Hemoglobin 13.0 - 17.0 g/dL 10.4(L) 11.8(L) 11.7(L)  Hematocrit 39.0 - 52.0 % 33.8(L) 36.9(L) 37.2(L)  Platelets 150 - 400 K/uL 313 329 372    BMET Recent Labs    03/04/19 0519 03/05/19 0359  NA 145 138  K 3.0* 3.4*  CL 112* 106  CO2 23 24  GLUCOSE 88 123*  BUN 11 9  CREATININE 0.49* 0.68  CALCIUM 9.8 8.8*   PT/INR No results for input(s): LABPROT, INR in the last 72 hours. ABG No results for input(s): PHART, HCO3 in the last 72 hours.  Invalid input(s): PCO2, PO2  Studies/Results: Dg Pelvis Comp Min 3v  Result Date: 03/04/2019 CLINICAL DATA:  Pelvic fracture EXAM: JUDET PELVIS - 3+ VIEW COMPARISON:  CT 12/03/2018.  Hip series 12/03/2018 FINDINGS: Catheter is noted over the pelvis. Surgical coils noted over the right pelvis. Extensive postsurgical changes left hip and pelvis with surgical screws in the left femoral neck, the left acetabulum/pubis, and across the left SI join. Again deformity noted of the left iliac wing and a process such as chronic  osteomyelitis cannot be excluded. Again loosening about the left SI joint screw heads cannot be excluded. Fractures are again noted of the pubic rami bilaterally. T IMPRESSION: 1. Extensive postsurgical changes left hip and pelvis again noted. Again loosening of the left SI joint surgical screw heads cannot be excluded. Fractures are again noted of the pubic rami bilaterally. 2. Again deformity noted of the left iliac wing. Again chronic osteomyelitis cannot be excluded. Electronically Signed   By: Marcello Moores  Register   On: 03/04/2019 09:32   Dg C-arm 1-60 Min-no Report  Result Date: 03/04/2019 Fluoroscopy was utilized by the requesting physician.  No radiographic interpretation.    Anti-infectives: Anti-infectives (From admission, onward)   Start     Dose/Rate Route Frequency Ordered Stop   03/04/19 1401  tobramycin (NEBCIN) powder  Status:  Discontinued       As needed 03/04/19 1401 03/04/19 1441   03/04/19 1359  vancomycin (VANCOCIN) powder  Status:  Discontinued       As needed 03/04/19 1400 03/04/19 1441   03/04/19 0600  ceFAZolin (ANCEF) IVPB 2g/100 mL premix     2 g 200 mL/hr over 30 Minutes Intravenous On call to O.R. 03/04/19 0000 03/04/19 1305   03/02/19 0600  vancomycin (VANCOCIN) 1,500 mg in sodium chloride 0.9 % 500 mL IVPB     1,500 mg 250 mL/hr over 120 Minutes Intravenous Every 24 hours 03/01/19 1658     03/01/19 1000  vancomycin (VANCOCIN)  1,250 mg in sodium chloride 0.9 % 250 mL IVPB  Status:  Discontinued     1,250 mg 166.7 mL/hr over 90 Minutes Intravenous Every 12 hours 02/28/19 2119 03/01/19 1658   03/01/19 0500  ceFEPIme (MAXIPIME) 2 g in sodium chloride 0.9 % 100 mL IVPB     2 g 200 mL/hr over 30 Minutes Intravenous Every 8 hours 02/28/19 2119     02/28/19 2130  vancomycin (VANCOCIN) 1,500 mg in sodium chloride 0.9 % 500 mL IVPB     1,500 mg 250 mL/hr over 120 Minutes Intravenous  Once 02/28/19 2116 03/01/19 0011   02/28/19 2100  ceFEPIme (MAXIPIME) 2 g in sodium  chloride 0.9 % 100 mL IVPB     2 g 200 mL/hr over 30 Minutes Intravenous  Once 02/28/19 2057 02/28/19 2147   02/28/19 2100  vancomycin (VANCOCIN) IVPB 1000 mg/200 mL premix  Status:  Discontinued     1,000 mg 200 mL/hr over 60 Minutes Intravenous  Once 02/28/19 2057 02/28/19 2116      Assessment/Plan: s/p Procedure(s): Postop day 1 after excision of hip wound with placement of Acell by Dr. Ulice Bold Postop day 1 after Left Hip Hardware Removal by Dr. Jena Gauss  Optimize nutritional status. - heart healthy.  Drink plenty of fluids. Incentive spirometer. VTE prophylaxis with Lovenox. Suprapubic cath and colostomy in place.  Dressing: continue with daily KY jelly to green mesh sorbact, dressing changes daily with gauze, abd and tape.   Appreciate input and management from medicine. Will follow up tomorrow.    LOS: 5 days    Leslee Home, PA-C 03/05/2019
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
## 2019-03-05 NOTE — Progress Notes (Signed)
PT Cancellation Note  Patient Details Name: Christopher [name] MRN: [id number] DOB: 03/10/1966   Cancelled Treatment:    Reason Eval/Treat Not Completed: Pain limiting ability to participate. Pt reporting nausea with all attempts at mobility during the day as well as a significant headache which has been unrelieved with pain medications.  Zenaida Niece, PT, DPT Acute Rehabilitation Pager: [phone]   Zenaida Niece 03/05/2019, 2:53 PM

## 2019-03-05 NOTE — Progress Notes (Signed)
Patient has Bps low 100s/50-60 & HR between 104-120 throughout night. MD notified and bolus & CBC ordered. Patient is complaining of headache this am and will receive PRN medication. Plastic surgeon MD Catalina Antigua stopped by and left number in case he needs to be reached [phone].

## 2019-03-06 ENCOUNTER — Encounter (HOSPITAL_COMMUNITY): Payer: Self-pay | Admitting: Plastic Surgery

## 2019-03-06 DIAGNOSIS — T148XXA Other injury of unspecified body region, initial encounter: Secondary | ICD-10-CM

## 2019-03-06 DIAGNOSIS — L089 Local infection of the skin and subcutaneous tissue, unspecified: Secondary | ICD-10-CM

## 2019-03-06 LAB — CBC
HCT: 31.8 % — ABNORMAL LOW (ref 39.0–52.0)
Hemoglobin: 9.8 g/dL — ABNORMAL LOW (ref 13.0–17.0)
MCH: 31.4 pg (ref 26.0–34.0)
MCHC: 30.8 g/dL (ref 30.0–36.0)
MCV: 101.9 fL — ABNORMAL HIGH (ref 80.0–100.0)
Platelets: 261 10*3/uL (ref 150–400)
RBC: 3.12 MIL/uL — ABNORMAL LOW (ref 4.22–5.81)
RDW: 13.7 % (ref 11.5–15.5)
WBC: 7.8 10*3/uL (ref 4.0–10.5)
nRBC: 0 % (ref 0.0–0.2)

## 2019-03-06 LAB — BASIC METABOLIC PANEL
Anion gap: 5 (ref 5–15)
BUN: 10 mg/dL (ref 6–20)
CO2: 25 mmol/L (ref 22–32)
Calcium: 8.8 mg/dL — ABNORMAL LOW (ref 8.9–10.3)
Chloride: 105 mmol/L (ref 98–111)
Creatinine, Ser: 0.44 mg/dL — ABNORMAL LOW (ref 0.61–1.24)
GFR calc Af Amer: >60 mL/min (ref >60)
GFR calc non Af Amer: >60 mL/min (ref >60)
Glucose, Bld: 101 mg/dL — ABNORMAL HIGH (ref 70–99)
Potassium: 3 mmol/L — ABNORMAL LOW (ref 3.5–5.1)
Sodium: 135 mmol/L (ref 135–145)

## 2019-03-06 LAB — VANCOMYCIN, TROUGH: Vancomycin Tr: 4 ug/mL — ABNORMAL LOW (ref 15–20)

## 2019-03-06 LAB — VANCOMYCIN, PEAK: Vancomycin Pk: <4 ug/mL — ABNORMAL LOW (ref 30–40)

## 2019-03-06 MED ORDER — VANCOMYCIN HCL 10 G IV SOLR
1750.0000 mg | INTRAVENOUS | Status: DC
  Administered 2019-03-06 – 2019-03-07 (×2): 1750 mg via INTRAVENOUS
  Filled 2019-03-06 (×2): qty 1750

## 2019-03-06 MED ORDER — SODIUM CHLORIDE 0.45 % IV SOLN: INTRAVENOUS | Status: DC

## 2019-03-06 MED ORDER — CHLORHEXIDINE GLUCONATE CLOTH 2 % EX PADS
6.0000 | MEDICATED_PAD | Freq: Every day | CUTANEOUS | Status: DC
  Administered 2019-03-06 – 2019-03-12 (×7): 6 via TOPICAL

## 2019-03-06 MED ORDER — SODIUM CHLORIDE 0.9 % IV SOLN
INTRAVENOUS | Status: DC
  Administered 2019-03-06 – 2019-03-09 (×3): via INTRAVENOUS

## 2019-03-06 MED ORDER — POTASSIUM CHLORIDE 20 MEQ PO PACK
40.0000 meq | PACK | Freq: Two times a day (BID) | ORAL | Status: DC
  Administered 2019-03-06 – 2019-03-09 (×8): 40 meq via ORAL
  Filled 2019-03-06 (×10): qty 2

## 2019-03-06 NOTE — Progress Notes (Signed)
03/06/19 1430  PT Visit Information  Last PT Received On 03/06/19  Assistance Needed +2  PT/OT/SLP Co-Evaluation/Treatment Yes  Reason for Co-Treatment For patient/therapist safety  PT goals addressed during session Mobility/safety with mobility;Balance  History of Present Illness Pt is a 53 year old man injured at work in January 2020 resulting in complex pelvic ring fx and degloving of pelvis, underwent fixation 07/12/18.  Pt has been followed by wound care and plastic surgery for chronic osteomyelitis.  Pt admitted 02/28/19 with worsening pain and drainage from wound, underwent removal of hardware, draining of posterior pelvis wound and removal of superficial bone. PMH: chronic pain, colostomy, suprapubic catheter, depression, anxiety.   Precautions  Precautions Fall  Precaution Comments watch BP  Restrictions  Weight Bearing Restrictions Yes  LLE Weight Bearing WBAT  Home Living  Family/patient expects to be discharged to: Private residence  Living Arrangements Spouse/significant other;Children (fiance and their two kids)  Available Help at Discharge Available 24 hours/day;Family  Type of [place] entrance  Home Layout One level  Bathroom Shower/Tub Tub/shower unit  Tax adviser - 2 wheels;Hospital bed;Wheelchair - power  Additional Comments pt does not get in the shower  Prior Function  Level of Independence Independent with assistive device(s)  Comments walks with a walker, girlfriend manages meds and colostomy care  Communication  Communication No difficulties  Pain Assessment  Pain Assessment Faces  Faces Pain Scale 8  Pain Location L hip/buttocks  Pain Descriptors / Indicators Aching;Moaning  Pain Intervention(s) Limited activity within patient's tolerance;Monitored during session;Repositioned  Cognition  Arousal/Alertness Awake/alert  Behavior During Therapy WFL for tasks assessed/performed  Overall  Cognitive Status Within Functional Limits for tasks assessed  Upper Extremity Assessment  Upper Extremity Assessment Defer to OT evaluation  Lower Extremity Assessment  Lower Extremity Assessment LLE deficits/detail;Generalized weakness  LLE Deficits / Details Very limited ROM secondary to pain.   Cervical / Trunk Assessment  Cervical / Trunk Assessment Normal  Bed Mobility  Overal bed mobility Needs Assistance  Bed Mobility Supine to Sit;Sit to Supine  Supine to sit +2 for physical assistance;Mod assist  Sit to supine +2 for physical assistance;Max assist  General bed mobility comments increased time and use of rail, assist for L LE in/out of bed and to raise trunk. Performed X2, however, pt orthostatic upon sitting, therefore further mobility deferred.   Balance  Overall balance assessment Needs assistance  Sitting-balance support Bilateral upper extremity supported;Feet supported  Sitting balance-Leahy Scale Poor  Sitting balance - Comments Reliant on mod-max A to maintain sitting balance secondary to dizziness.   PT - End of Session  Activity Tolerance Treatment limited secondary to medical complications (Comment) (orthostatic)  Patient left in bed;with call bell/phone within reach  Nurse Communication Mobility status  PT Assessment  PT Recommendation/Assessment Patient needs continued PT services  PT Visit Diagnosis Difficulty in walking, not elsewhere classified (R26.2);Pain;Muscle weakness (generalized) (M62.81)  Pain - Right/Left Left  Pain - part of body Leg;Hip  PT Problem List Decreased range of motion;Decreased strength;Decreased balance;Decreased activity tolerance;Decreased mobility;Decreased knowledge of use of DME;Pain  PT Plan  PT Frequency (ACUTE ONLY) Min 5X/week  PT Treatment/Interventions (ACUTE ONLY) DME instruction;Gait training;Functional mobility training;Therapeutic activities;Therapeutic exercise;Balance training;Patient/family education  AM-PAC PT "6  Clicks" Mobility Outcome Measure (Version 2)  Help needed turning from your back to your side while in a flat bed without using bedrails? 2  Help needed moving from lying on your  back to sitting on the side of a flat bed without using bedrails? 2  Help needed moving to and from a bed to a chair (including a wheelchair)? 1  Help needed standing up from a chair using your arms (e.g., wheelchair or bedside chair)? 1  Help needed to walk in hospital room? 1  Help needed climbing 3-5 steps with a railing?  1  6 Click Score 8  Consider Recommendation of Discharge To: CIR/SNF/LTACH  PT Recommendation  Follow Up Recommendations Supervision/Assistance - 24 hour;Other (comment) (TBD pending progress; pt wanting HHPT )  PT equipment None recommended by PT  Individuals Consulted  Consulted and Agree with Results and Recommendations Patient  Acute Rehab PT Goals  Patient Stated Goal to go home with pain managed  PT Goal Formulation With patient  Time For Goal Achievement 03/20/19  Potential to Achieve Goals Good  PT Time Calculation  PT Start Time (ACUTE ONLY) 1223  PT Stop Time (ACUTE ONLY) 1250  PT Time Calculation (min) (ACUTE ONLY) 27 min  PT General Charges  $$ ACUTE PT VISIT 1 Visit  PT Evaluation  $PT Eval Moderate Complexity 1 Mod  Written Expression  Dominant Hand Right   Pt s/p surgery above with deficits below. Pt limited secondary to symptomatic orthostatics with BP dropping to 86/60 mmHg. Pt required mod-max A +2 to sit EOB X2. Pt positioned with HOB elevated at end of session to attempt to acclimate to upright. Pt hoping to go home with HHPT at d/c. Will continue to follow acutely to maximize functional mobility independence and safety and update recommendations as appropriate.   Gladys Damme, PT, DPT  Acute Rehabilitation Services  Pager: [phone] Office: [phone]

## 2019-03-06 NOTE — Progress Notes (Signed)
Orthopaedic Trauma Progress Note  S: Working with OT. Is lightheaded. Pain not much better  O:  Vitals:   03/06/19 0423 03/06/19 0814  BP: 123/77 124/84  Pulse: (!) 102 97  Resp:  17  Temp: 97.7 F (36.5 C) 97.7 F (36.5 C)  SpO2: 97% 96%    General - Laying in bed, NAD.  Pelvis/Left Lower Extremity - Dressing in place over hip imited range of motion of hip secondary to pain. Sensation intact distally. Able to wiggle toes some. 2+ DP pulse. Skin warm and dry.  Imaging: Stable   Labs:  Results for orders placed or performed during the hospital encounter of 02/28/19 (from the past 24 hour(s))  CBC     Status: Abnormal   Collection Time: 03/06/19  6:09 AM  Result Value Ref Range   WBC 7.8 4.0 - 10.5 K/uL   RBC 3.12 (L) 4.22 - 5.81 MIL/uL   Hemoglobin 9.8 (L) 13.0 - 17.0 g/dL   HCT 31.8 (L) 39.0 - 52.0 %   MCV 101.9 (H) 80.0 - 100.0 fL   MCH 31.4 26.0 - 34.0 pg   MCHC 30.8 30.0 - 36.0 g/dL   RDW 13.7 11.5 - 15.5 %   Platelets 261 150 - 400 K/uL   nRBC 0.0 0.0 - 0.2 %  Basic metabolic panel     Status: Abnormal   Collection Time: 03/06/19  6:09 AM  Result Value Ref Range   Sodium 135 135 - 145 mmol/L   Potassium 3.0 (L) 3.5 - 5.1 mmol/L   Chloride 105 98 - 111 mmol/L   CO2 25 22 - 32 mmol/L   Glucose, Bld 101 (H) 70 - 99 mg/dL   BUN 10 6 - 20 mg/dL   Creatinine, Ser 0.44 (L) 0.61 - 1.24 mg/dL   Calcium 8.8 (L) 8.9 - 10.3 mg/dL   GFR calc non Af Amer >60 >60 mL/min   GFR calc Af Amer >60 >60 mL/min   Anion gap 5 5 - 15  Vancomycin, trough     Status: Abnormal   Collection Time: 03/06/19  6:09 AM  Result Value Ref Range   Vancomycin Tr 4 (L) 15 - 20 ug/mL  Vancomycin, peak     Status: Abnormal   Collection Time: 03/06/19  9:48 AM  Result Value Ref Range   Vancomycin Pk <4 (L) 30 - 40 ug/mL    Assessment: 53 year old male with severe pelvic ring injury and degloving injury of his pelvis s/p percutaneous fixation 07/12/18  Injuries: 1. Complex pelvic ring injury  with severe soft tissue injury s/p removal of hardware 03/04/19 2. Draining posterior pelvic wound/Chronic osteomyelitis s/p removal of superficial bone 03/04/19   Weightbearing: WBAT LLE  Insicional and dressing care: per plastic surgery   Orthopedic device(s): None  CV/Blood loss: Acute blood loss anemia, Hgb 10.4 this morning. Slightly tachycardic, likely secondary to pain  Pain management: per primary team  VTE prophylaxis: Lovenox  ID: Will need formal ID consult for antibiotic regimen  Foley/Lines: suprapubic cath, continue IVFs per primary team  Dispo: Will continue to follow while in hospital, PT/OT eval  Follow - up plan: 2 weeks    Sarah A. Carmie [facility] ?[phone]? (phone)

## 2019-03-06 NOTE — Progress Notes (Signed)
PROGRESS NOTE  Christopher [name] TZG:[id number] DOB: March 19, 1966 DOA: 02/28/2019 PCP: Vivi Barrack, MD  HPI/Recap of past 24 hours: Christopher [name] is a 53 y.o. 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 Pt states that he was involved in a very significant trauma in January and has had chronic wound in left buttock area since that time. Pt is following up with plastic surgery as outpatient for wound management. He noted increased drainage in the past several days. He had mild fever at home.  He is body temperature is 97.9 in ED.  Patient was started with doxycycline last Thursday without improvement.  He continues to have worsening pain, which is constant, sharp, 9 out of 10 in severity, nonradiating. Patient does not have chest pain, shortness breath, cough, nausea, vomiting, diarrhea, abdominal pain, symptoms of UTI. In ED pt was found to have WBC 5.8, lactic acid 1.5, pending COVID-19 test, electrolytes renal function okay, temperature normal, blood pressure 121/79, heart rate 94, oxygen saturation 94-97% on room air.  Chest x-ray negative.  CT of pelvis showed possible chronic osteomyelitis, and kidney stone and right mild hydronephrosis.  Patient is admitted to [facility] bed for observation.  Urology, Dr. Junious Silk was consulted by EDP. Status-post removal of hardware pelvis and excision of superficial bone pelvis for osteomyelitis by ortho, Dr. Doreatha Martin and Dr. Marla Roe on 03/04/19.   Assessment/Plan: Principal Problem:   Wound infection, posttraumatic Active Problems:   Anxiety and depression   Wound of buttock   Colostomy status (HCC)   Chronic pain due to trauma   Hydronephrosis, right   GERD (gastroesophageal reflux disease)   Suprapubic catheter (HCC)   Protein-calorie  malnutrition, moderate (HCC)   Left posterior hip wound/chronic osteomyelitis S/P removal of hardware pelvis and excision of superficial bone pelvis for osteomyelitis 03/04/19 by ortho, Dr. Doreatha Martin and Dr. Marla Roe Elevated ESR and CRP - typical given osteomyelitis - trend per surgery Continue IV antibiotics empirically, IV vancomycin and cefepime Surgical wound Gram stain grew gram-positive cocci and rare gram variable rods Wound culture pending Follow cultures Plastic surgery and orthopedic surgery following  Hypokalemia, ongoing Potassium 3.0 - 80 MEQ today Unclear etiology - possible GI loss vs poor PO intake  Continue to replete as necessary  Severe protein calorie malnutrition BMI of 16 with albumin of 2.6 Nutritionist to assist with nutritional needs Encourage oral protein calorie intake Continue oral supplement  Physical debility/ambulatory dysfunction PT OT following Fall precautions  Asymptomatic pyuria - UTI ruled out Presented with pyuria but with insignificant growth on urine culture Currently on IV cefepime and IV vancomycin empirically for left hip infection  Chronic pain syndrome Not opiate nave On high doses of opiates at home Continue home fentanyl patch 50 mcg/h every other day Continue gabapentin 300 mg 3 times daily, Robaxin 500 mg every 6 hours as needed for muscle spasm Continue Percocet 5-325 mg 1 tablet every 4 hours as needed for severe pain  Chronic anxiety/depression Continue home medication Celexa and Klonopins  GERD Continue home medication  Right hydronephrosis with nephrolithiasis Suprapubic catheter in place - chronic Continue to monitor urine output and renal function Creatinine remains WNL  GERD Continue Protonix  Status post colostomy/suprapubic catheter, stable Continue  DVT ppx: SCD/subcu Lovenox daily Code Status: Full code Family Communication:   None present  Disposition Plan:  Patient is currently not appropriate  for discharge due to persistent symptomatology requiring IV pain medications and IV antibiotics post surgical intervention.  Consults called:  Urology, Dr. Junious Silk was consulted, plastic surgery, orthopedic surgery.  Objective: Vitals:   03/05/19 1520 03/05/19 2040 03/06/19 0423 03/06/19 0814  BP: 119/78 120/73 123/77 124/84  Pulse: (!) 105 96 (!) 102 97  Resp: 20   17  Temp: 98.8 F (37.1 C) 98.3 F (36.8 C) 97.7 F (36.5 C) 97.7 F (36.5 C)  TempSrc: Oral Oral Oral Oral  SpO2: 97% 98% 97% 96%  Weight:      Height:        Intake/Output Summary (Last 24 hours) at 03/06/2019 1124 Last data filed at 03/06/2019 1000 Gross per 24 hour  Intake 660 ml  Output -  Net 660 ml   Filed Weights   02/28/19 1303 03/01/19 0650  Weight: 70.3 kg 60.8 kg    Exam:  . General: 53 y.o. year-old male frail-appearing.  Awake, alert, no acute distress, tolerating PO well. . Cardiovascular: Regular rate and rhythm no rubs or gallops no JVD or thyromegaly noted.   Marland Kitchen Respiratory: Clear to auscultation no wheezes or rales. Poor inspiratory effort.  . Abdomen: Normal bowel sounds present.  Nontender nondistended.  Suprapubic catheter in place.  Ostomy in place. . Musculoskeletal: Left hip wound bandage clean/dry/intact   Data Reviewed: CBC: Recent Labs  Lab 02/28/19 1357 03/01/19 0504 03/04/19 0519 03/05/19 0506 03/06/19 0609  WBC 5.8 6.1 5.4 8.0 7.8  NEUTROABS 4.3  --  3.0  --   --   HGB 12.5* 11.7* 11.8* 10.4* 9.8*  HCT 40.6 37.2* 36.9* 33.8* 31.8*  MCV 103.6* 103.6* 101.9* 103.0* 101.9*  PLT 379 372 329 313 825   Basic Metabolic Panel: Recent Labs  Lab 02/28/19 1357 03/01/19 0504 03/04/19 0519 03/05/19 0359 03/06/19 0609  NA 140 140 145 138 135  K 4.0 4.1 3.0* 3.4* 3.0*  CL 104 106 112* 106 105  CO2 '23 25 23 24 25  ' GLUCOSE 111* 102* 88 123* 101*  BUN '11 13 11 9 10  ' CREATININE 0.56* 0.64 0.49* 0.68 0.44*  CALCIUM 10.0 10.1 9.8 8.8* 8.8*   GFR: Estimated Creatinine  Clearance: 92.9 mL/min (A) (by C-G formula based on SCr of 0.44 mg/dL (L)).   Liver Function Tests: Recent Labs  Lab 02/28/19 1357 03/04/19 0519  AST 21 14*  ALT 16 12  ALKPHOS 130* 99  BILITOT 0.6 0.4  PROT 7.8 7.1  ALBUMIN 2.8* 2.6*   Urine analysis:    Component Value Date/Time   COLORURINE YELLOW 03/01/2019 0945   APPEARANCEUR HAZY (A) 03/01/2019 0945   LABSPEC 1.027 03/01/2019 0945   PHURINE 5.0 03/01/2019 0945   GLUCOSEU NEGATIVE 03/01/2019 0945   HGBUR NEGATIVE 03/01/2019 0945   BILIRUBINUR NEGATIVE 03/01/2019 0945   KETONESUR 80 (A) 03/01/2019 0945   PROTEINUR NEGATIVE 03/01/2019 0945   NITRITE NEGATIVE 03/01/2019 0945   LEUKOCYTESUR LARGE (A) 03/01/2019 0945    Recent Results (from the past 240 hour(s))  Blood culture (routine x 2)     Status: None   Collection Time: 02/28/19  8:45 PM   Specimen: BLOOD  Result Value Ref Range Status   Specimen Description BLOOD RIGHT ANTECUBITAL  Final   Special Requests   Final    BOTTLES DRAWN AEROBIC AND ANAEROBIC Blood Culture results may not be optimal due to an inadequate volume of blood received in culture bottles   Culture   Final  NO GROWTH 5 DAYS Performed at [facility], [place] [address], [place], [place] [address]    Report Status 03/05/2019 FINAL  Final  Blood culture (routine x 2)     Status: None   Collection Time: 02/28/19  8:50 PM   Specimen: BLOOD LEFT HAND  Result Value Ref Range Status   Specimen Description BLOOD LEFT HAND  Final   Special Requests   Final    BOTTLES DRAWN AEROBIC ONLY Blood Culture results may not be optimal due to an inadequate volume of blood received in culture bottles   Culture   Final    NO GROWTH 5 DAYS Performed at [facility], [place] [address], [place], [place] [address]    Report Status 03/05/2019 FINAL  Final  Urine culture     Status: Abnormal   Collection Time: 02/28/19 10:22 PM   Specimen: Urine, Random  Result Value Ref Range Status   Specimen  Description URINE, RANDOM  Final   Special Requests NONE  Final   Culture (A)  Final    <10,000 COLONIES/mL INSIGNIFICANT GROWTH Performed at [facility], [place] [address], [place], [place] [address]    Report Status 03/02/2019 FINAL  Final  SARS CORONAVIRUS 2 (TAT 6-24 HRS) Nasopharyngeal Nasopharyngeal Swab     Status: None   Collection Time: 02/28/19 10:44 PM   Specimen: Nasopharyngeal Swab  Result Value Ref Range Status   SARS Coronavirus 2 NEGATIVE NEGATIVE Final    Comment: (NOTE) SARS-CoV-2 target nucleic acids are NOT DETECTED. The SARS-CoV-2 RNA is generally detectable in upper and lower respiratory specimens during the acute phase of infection. Negative results do not preclude SARS-CoV-2 infection, do not rule out co-infections with other pathogens, and should not be used as the sole basis for treatment or other patient management decisions. Negative results must be combined with clinical observations, patient history, and epidemiological information. The expected result is Negative. Fact Sheet for Patients: SugarRoll.be Fact Sheet for Healthcare Providers: https://www.woods-mathews.com/ This test is not yet approved or cleared by the [place] FDA and  has been authorized for detection and/or diagnosis of SARS-CoV-2 by FDA under an Emergency Use Authorization (EUA). This EUA will remain  in effect (meaning this test can be used) for the duration of the COVID-19 declaration under Section 56 4(b)(1) of the Act, 21 U.S.C. section 360bbb-3(b)(1), unless the authorization is terminated or revoked sooner. Performed at [facility], [place] [address] [place], [place] [address]   Aerobic/Anaerobic Culture (surgical/deep wound)     Status: None (Preliminary result)   Collection Time: 03/04/19  1:30 PM   Specimen: Wound; Tissue  Result Value Ref Range Status   Specimen Description TISSUE WOUND LEFT PELVIS  Final   Special  Requests NONE  Final   Gram Stain   Final    RARE WBC PRESENT,BOTH PMN AND MONONUCLEAR FEW GRAM POSITIVE COCCI RARE GRAM VARIABLE ROD    Culture   Final    HOLDING FOR POSSIBLE ANAEROBE Performed at [facility], [place] [address], [place], [place] [address]    Report Status PENDING  Incomplete  Aerobic/Anaerobic Culture (surgical/deep wound)     Status: None (Preliminary result)   Collection Time: 03/04/19  1:40 PM   Specimen: Wound  Result Value Ref Range Status   Specimen Description WOUND LEFT PELVIS  Final   Special Requests SWAB SPEC B  Final   Gram Stain NO WBC SEEN RARE GRAM VARIABLE ROD  Final   Culture   Final    NO GROWTH 2 DAYS Performed at [facility], [place] [address], [place], [place] [address]    Report Status PENDING  Incomplete     Studies: No results found.  Scheduled Meds: . Chlorhexidine Gluconate Cloth  6 each Topical Q0600  . citalopram  20 mg Oral Daily  . enoxaparin (LOVENOX) injection  40 mg Subcutaneous Q24H  . feeding supplement (ENSURE ENLIVE)  237 mL Oral BID BM  . feeding supplement (PRO-STAT SUGAR FREE 64)  30 mL Oral TID  . fentaNYL  1 patch Transdermal Q48H  . gabapentin  300 mg Oral TID  . influenza vac split quadrivalent PF  0.5 mL Intramuscular Tomorrow-1000  . Melatonin  3 mg Oral QHS  . multivitamin with minerals  1 tablet Oral Daily  . pantoprazole  40 mg Oral Daily  . QUEtiapine  50 mg Oral QHS  . senna-docusate  2 tablet Oral QHS    Continuous Infusions: . ceFEPime (MAXIPIME) IV Stopped (03/06/19 0526)  . lactated ringers 10 mL/hr at 03/04/19 2159  . vancomycin       LOS: 6 days   Little Ishikawa, DO [facility] Pager [phone]  If 7PM-7AM, please contact night-coverage [email or web address] Password [facility] 03/06/2019, 11:24 AM

## 2019-03-06 NOTE — Progress Notes (Signed)
Pharmacy Antibiotic Note  Christopher [name] is a 53 y.o. male admitted on 02/28/2019 with infection of unknown source. Covering for chronic left hip/gluteus wound and UTI. Has suprapubic catheter  Vancomycin trough 4, peak level not drawn appropriately  Plan: Increase Vancomycin to 1750 mg iv Q 24 hours based on low trough level Continue Cefepime 2 gm IV q8hrs. Follow renal function, culture data, clinical progress and antibiotic plans. Vanc levels at steady state.  Height: 6\' 3"  (190.5 cm) Weight: 134 lb 0.6 oz (60.8 kg) IBW/kg (Calculated) : 84.5  Temp (24hrs), Avg:98.1 F (36.7 C), Min:97.7 F (36.5 C), Max:98.8 F (37.1 C)  Recent Labs  Lab 02/28/19 1357 02/28/19 1401 02/28/19 2055 03/01/19 0504 03/04/19 0519 03/05/19 0359 03/05/19 0506 03/06/19 0609 03/06/19 0948  WBC 5.8  --   --  6.1 5.4  --  8.0 7.8  --   CREATININE 0.56*  --   --  0.64 0.49* 0.68  --  0.44*  --   LATICACIDVEN  --  1.6 1.5  --   --   --   --   --   --   VANCOTROUGH  --   --   --   --   --   --   --  4*  --   VANCOPEAK  --   --   --   --   --   --   --   --  <4*    Estimated Creatinine Clearance: 92.9 mL/min (A) (by C-G formula based on SCr of 0.44 mg/dL (L)).    No Active Allergies  Antimicrobials this admission:  Cefepime 9/17>>  Vancomycin 9/17  Dose adjustments this admission:  9/18: adjusted empiric Vanc regimen for updated (lower) body weight  Microbiology results:  9/17 blood x 2: sent  9/17 urine: sent  9/17 COVID: negative  Thank you Anette Guarneri, PharmD 03/06/2019 11:04 AM

## 2019-03-06 NOTE — Anesthesia Postprocedure Evaluation (Signed)
Anesthesia Post Note  Patient: Christopher [name]  Procedure(s) Performed: Excision of hip wound with placement of Acell (Left Hip) Left Hip Hardware Removal (Left Hip)     Patient location during evaluation: PACU Anesthesia Type: General Level of consciousness: awake and alert Pain management: pain level controlled Vital Signs Assessment: post-procedure vital signs reviewed and stable Respiratory status: spontaneous breathing, nonlabored ventilation, respiratory function stable and patient connected to nasal cannula oxygen Cardiovascular status: blood pressure returned to baseline and stable Postop Assessment: no apparent nausea or vomiting Anesthetic complications: no    Last Vitals:  Vitals:   03/06/19 0814 03/06/19 1453  BP: 124/84 111/73  Pulse: 97 97  Resp: 17 15  Temp: 36.5 C 37 C  SpO2: 96% 97%    Last Pain:  Vitals:   03/06/19 1732  TempSrc:   PainSc: 9                  Joanmarie Tsang

## 2019-03-06 NOTE — Evaluation (Signed)
Occupational Therapy Evaluation Patient Details Name: Christopher [name] MRN: [id number] DOB: May 24, 1966 Today's Date: 03/06/2019    History of Present Illness Pt is a 53 year old man injured at work in January 2020 resulting in complex pelvic ring fx and degloving of pelvis, underwent fixation 07/12/18.  Pt has been followed by wound care and plastic surgery for chronic osteomyelitis.  Pt admitted 02/28/19 with worsening pain and drainage from wound, underwent removal of hardware, draining of posterior pelvis wound and removal of superficial bone. PMH: chronic pain, colostomy, suprapubic catheter, depression, anxiety.    Clinical Impression   Pt was ambulatory with a RW and sponge bathed and dressed himself prior to admission. Pt was assisted by his fiance for medication and colostomy management as well as IADL. Pt presents with significant pain in his L hip. Attempted to sit at EOB with +2 mod assist x 2, but pt experiencing symptomatic hypotension with BP down to 86/60. Left pt in bed with HOB up and asked that pt continue to work toward placing bed in a chair position so he may get used to being upright. Pt agreeable. Will follow acutely.    Follow Up Recommendations  Home health OT    Equipment Recommendations       Recommendations for Other Services       Precautions / Restrictions Precautions Precautions: Fall Precaution Comments: watch BP      Mobility Bed Mobility Overal bed mobility: Needs Assistance Bed Mobility: Supine to Sit;Sit to Supine     Supine to sit: +2 for physical assistance;Mod assist Sit to supine: +2 for physical assistance;Max assist   General bed mobility comments: increased time and use of rail, assist for L LE in/out of bed and to raise trunk  Transfers   Equipment used: Rolling walker (2 wheeled)                  Balance Overall balance assessment: Needs assistance Sitting-balance support: Bilateral upper extremity supported;Feet  supported Sitting balance-Leahy Scale: Poor                                     ADL either performed or assessed with clinical judgement   ADL Overall ADL's : Needs assistance/impaired Eating/Feeding: Independent;Bed level   Grooming: Wash/dry hands;Wash/dry face;Bed level;Set up   Upper Body Bathing: Moderate assistance;Bed level   Lower Body Bathing: Bed level;Total assistance   Upper Body Dressing : Minimal assistance;Bed level   Lower Body Dressing: Total assistance;Sit to/from stand                       Vision Baseline Vision/History: No visual deficits       Perception     Praxis      Pertinent Vitals/Pain Pain Assessment: Faces Faces Pain Scale: Hurts whole lot Pain Location: L hip/buttocks Pain Descriptors / Indicators: Aching;Moaning Pain Intervention(s): Monitored during session;Premedicated before session;Repositioned     Hand Dominance Right   Extremity/Trunk Assessment Upper Extremity Assessment Upper Extremity Assessment: Overall WFL for tasks assessed   Lower Extremity Assessment Lower Extremity Assessment: Defer to PT evaluation   Cervical / Trunk Assessment Cervical / Trunk Assessment: Normal   Communication Communication Communication: No difficulties   Cognition Arousal/Alertness: Awake/alert Behavior During Therapy: WFL for tasks assessed/performed Overall Cognitive Status: Within Functional Limits for tasks assessed  General Comments       Exercises     Shoulder Instructions      Home Living Family/patient expects to be discharged to:: Private residence Living Arrangements: Spouse/significant other;Children(fiance and their two kids) Available Help at Discharge: Available 24 hours/day;Family Type of Home: Mobile home Home Access: Ramped entrance     Home Layout: One level     Bathroom Shower/Tub: Chief Strategy Officer: Standard      Home Equipment: Environmental consultant - 2 wheels;Hospital bed;Wheelchair - power   Additional Comments: pt does not get in the shower      Prior Functioning/Environment Level of Independence: Independent with assistive device(s)        Comments: walks with a walker, girlfriend manages meds and colostomy care        OT Problem List: Decreased strength;Decreased activity tolerance;Impaired balance (sitting and/or standing);Decreased knowledge of use of DME or AE;Pain;Cardiopulmonary status limiting activity      OT Treatment/Interventions: Self-care/ADL training;DME and/or AE instruction;Patient/family education;Balance training;Therapeutic activities    OT Goals(Current goals can be found in the care plan section) Acute Rehab OT Goals Patient Stated Goal: to go home with pain managed OT Goal Formulation: With patient Time For Goal Achievement: 03/20/19 Potential to Achieve Goals: Good ADL Goals Pt Will Perform Grooming: with set-up;sitting(at sink) Pt Will Perform Lower Body Bathing: with min assist;with adaptive equipment;sit to/from stand Pt Will Perform Lower Body Dressing: with min assist;with adaptive equipment;sit to/from stand Pt Will Transfer to Toilet: with min assist;ambulating;bedside commode Pt Will Perform Toileting - Clothing Manipulation and hygiene: with min assist;sit to/from stand Additional ADL Goal #1: Pt will perform bed mobility with supervision in preparation for ADL. Additional ADL Goal #2: Pt will participate in ADL seatee EOB x 10 min without LOB.  OT Frequency: Min 2X/week   Barriers to D/C:            Co-evaluation PT/OT/SLP Co-Evaluation/Treatment: Yes Reason for Co-Treatment: For patient/therapist safety   OT goals addressed during session: ADL's and self-care      AM-PAC OT "6 Clicks" Daily Activity     Outcome Measure Help from another person eating meals?: None Help from another person taking care of personal grooming?: None Help from another  person toileting, which includes using toliet, bedpan, or urinal?: A Lot Help from another person bathing (including washing, rinsing, drying)?: A Lot Help from another person to put on and taking off regular upper body clothing?: A Little Help from another person to put on and taking off regular lower body clothing?: Total 6 Click Score: 16   End of Session Nurse Communication: Patient requests pain meds  Activity Tolerance: Treatment limited secondary to medical complications (Comment)(hypotensive) Patient left: in bed;with call bell/phone within reach  OT Visit Diagnosis: Pain;Muscle weakness (generalized) (M62.81)                Time: 1761-6073 OT Time Calculation (min): 25 min Charges:  OT General Charges $OT Visit: 1 Visit OT Evaluation $OT Eval Moderate Complexity: 1 Mod  Martie Round, OTR/L Acute Rehabilitation Services Pager: [phone] Office: [phone]  Evern Bio 03/06/2019, 2:22 PM

## 2019-03-06 NOTE — Progress Notes (Signed)
2 Days Post-Op  Subjective: Christopher [name] reports his hip pain is improving.  No longer has a headache.  No complaints other than pain.  Objective: Vital signs in last 24 hours: Temp:  [97.7 F (36.5 C)-98.8 F (37.1 C)] 97.7 F (36.5 C) (09/23 0814) Pulse Rate:  [96-105] 97 (09/23 0814) Resp:  [17-20] 17 (09/23 0814) BP: (119-124)/(73-84) 124/84 (09/23 0814) SpO2:  [96 %-98 %] 96 % (09/23 0814) Last BM Date: 03/05/19  Intake/Output from previous day: 09/22 0701 - 09/23 0700 In: 660 [P.O.:360; I.V.:100] Out: -  Intake/Output this shift: No intake/output data recorded.  General appearance: alert, cooperative, no distress and resting in bed Head: Normocephalic, without obvious abnormality, atraumatic Extremities: extremities normal, atraumatic, no cyanosis or edema and skin graft site on LUE intact Pulses: 2+ and symmetric Skin: Skin color, texture, turgor normal. No rashes or lesions Neurologic: Grossly normal Incision/Wound: Dressing in place over left hip, no surrounding erythema. No purulent drainage noted.  Lab Results:  CBC Latest Ref Rng & Units 03/06/2019 03/05/2019 03/04/2019  WBC 4.0 - 10.5 K/uL 7.8 8.0 5.4  Hemoglobin 13.0 - 17.0 g/dL 0.2(I) 10.4(L) 11.8(L)  Hematocrit 39.0 - 52.0 % 31.8(L) 33.8(L) 36.9(L)  Platelets 150 - 400 K/uL 261 313 329    BMET Recent Labs    03/05/19 0359 03/06/19 0609  NA 138 135  K 3.4* 3.0*  CL 106 105  CO2 24 25  GLUCOSE 123* 101*  BUN 9 10  CREATININE 0.68 0.44*  CALCIUM 8.8* 8.8*   PT/INR No results for input(s): LABPROT, INR in the last 72 hours. ABG No results for input(s): PHART, HCO3 in the last 72 hours.  Invalid input(s): PCO2, PO2  Studies/Results: Dg Pelvis Comp Min 3v  Result Date: 03/04/2019 CLINICAL DATA:  Pelvic fracture EXAM: [facility] PELVIS - 3+ VIEW COMPARISON:  CT 12/03/2018.  Hip series 12/03/2018 FINDINGS: Catheter is noted over the pelvis. Surgical coils noted over the right pelvis. Extensive  postsurgical changes left hip and pelvis with surgical screws in the left femoral neck, the left acetabulum/pubis, and across the left SI join. Again deformity noted of the left iliac wing and a process such as chronic osteomyelitis cannot be excluded. Again loosening about the left SI joint screw heads cannot be excluded. Fractures are again noted of the pubic rami bilaterally. T IMPRESSION: 1. Extensive postsurgical changes left hip and pelvis again noted. Again loosening of the left SI joint surgical screw heads cannot be excluded. Fractures are again noted of the pubic rami bilaterally. 2. Again deformity noted of the left iliac wing. Again chronic osteomyelitis cannot be excluded. Electronically Signed   By: Maisie Fus  Register   On: 03/04/2019 09:32   Dg C-arm 1-60 Min-no Report  Result Date: 03/04/2019 Fluoroscopy was utilized by the requesting physician.  No radiographic interpretation.    Anti-infectives: Anti-infectives (From admission, onward)   Start     Dose/Rate Route Frequency Ordered Stop   03/04/19 1401  tobramycin (NEBCIN) powder  Status:  Discontinued       As needed 03/04/19 1401 03/04/19 1441   03/04/19 1359  vancomycin (VANCOCIN) powder  Status:  Discontinued       As needed 03/04/19 1400 03/04/19 1441   03/04/19 0600  ceFAZolin (ANCEF) IVPB 2g/100 mL premix     2 g 200 mL/hr over 30 Minutes Intravenous On call to O.R. 03/04/19 0000 03/04/19 1305   03/02/19 0600  vancomycin (VANCOCIN) 1,500 mg in sodium chloride 0.9 % 500 mL IVPB  1,500 mg 250 mL/hr over 120 Minutes Intravenous Every 24 hours 03/01/19 1658     03/01/19 1000  vancomycin (VANCOCIN) 1,250 mg in sodium chloride 0.9 % 250 mL IVPB  Status:  Discontinued     1,250 mg 166.7 mL/hr over 90 Minutes Intravenous Every 12 hours 02/28/19 2119 03/01/19 1658   03/01/19 0500  ceFEPIme (MAXIPIME) 2 g in sodium chloride 0.9 % 100 mL IVPB     2 g 200 mL/hr over 30 Minutes Intravenous Every 8 hours 02/28/19 2119      02/28/19 2130  vancomycin (VANCOCIN) 1,500 mg in sodium chloride 0.9 % 500 mL IVPB     1,500 mg 250 mL/hr over 120 Minutes Intravenous  Once 02/28/19 2116 03/01/19 0011   02/28/19 2100  ceFEPIme (MAXIPIME) 2 g in sodium chloride 0.9 % 100 mL IVPB     2 g 200 mL/hr over 30 Minutes Intravenous  Once 02/28/19 2057 02/28/19 2147   02/28/19 2100  vancomycin (VANCOCIN) IVPB 1000 mg/200 mL premix  Status:  Discontinued     1,000 mg 200 mL/hr over 60 Minutes Intravenous  Once 02/28/19 2057 02/28/19 2116      Assessment/Plan: s/p Procedure(s): POD2 Excision of hip wound with placement of Acell Left Hip Hardware Removal  Optimize nutritional status for healing VTE ppx with lovenox Incentive spirometer provided.  Await cultures, currently gram + cocci and rare gram variable rods  Continue with daily dressing changes - KY jelly over sorbact, 4x4 gauze, ABD, light tape to avoid skin breakdown of peri-wound area   LOS: 6 days    Charlies Constable, PA-C 03/06/2019

## 2019-03-07 ENCOUNTER — Inpatient Hospital Stay: Payer: Self-pay

## 2019-03-07 ENCOUNTER — Inpatient Hospital Stay (HOSPITAL_COMMUNITY): Payer: No Typology Code available for payment source

## 2019-03-07 DIAGNOSIS — Z87891 Personal history of nicotine dependence: Secondary | ICD-10-CM

## 2019-03-07 DIAGNOSIS — M86652 Other chronic osteomyelitis, left thigh: Secondary | ICD-10-CM

## 2019-03-07 DIAGNOSIS — M8668 Other chronic osteomyelitis, other site: Secondary | ICD-10-CM

## 2019-03-07 DIAGNOSIS — F17211 Nicotine dependence, cigarettes, in remission: Secondary | ICD-10-CM

## 2019-03-07 DIAGNOSIS — Z8619 Personal history of other infectious and parasitic diseases: Secondary | ICD-10-CM

## 2019-03-07 DIAGNOSIS — Z931 Gastrostomy status: Secondary | ICD-10-CM

## 2019-03-07 LAB — CBC
HCT: 35 % — ABNORMAL LOW (ref 39.0–52.0)
Hemoglobin: 10.4 g/dL — ABNORMAL LOW (ref 13.0–17.0)
MCH: 31 pg (ref 26.0–34.0)
MCHC: 29.7 g/dL — ABNORMAL LOW (ref 30.0–36.0)
MCV: 104.5 fL — ABNORMAL HIGH (ref 80.0–100.0)
Platelets: 257 10*3/uL (ref 150–400)
RBC: 3.35 MIL/uL — ABNORMAL LOW (ref 4.22–5.81)
RDW: 13.8 % (ref 11.5–15.5)
WBC: 6.7 10*3/uL (ref 4.0–10.5)
nRBC: 0 % (ref 0.0–0.2)

## 2019-03-07 LAB — MAGNESIUM: Magnesium: 2 mg/dL (ref 1.7–2.4)

## 2019-03-07 LAB — BASIC METABOLIC PANEL
Anion gap: 6 (ref 5–15)
BUN: 13 mg/dL (ref 6–20)
CO2: 25 mmol/L (ref 22–32)
Calcium: 9.2 mg/dL (ref 8.9–10.3)
Chloride: 109 mmol/L (ref 98–111)
Creatinine, Ser: 0.53 mg/dL — ABNORMAL LOW (ref 0.61–1.24)
GFR calc Af Amer: >60 mL/min (ref >60)
GFR calc non Af Amer: >60 mL/min (ref >60)
Glucose, Bld: 89 mg/dL (ref 70–99)
Potassium: 2.9 mmol/L — ABNORMAL LOW (ref 3.5–5.1)
Sodium: 140 mmol/L (ref 135–145)

## 2019-03-07 MED ORDER — POLYETHYLENE GLYCOL 3350 17 G PO PACK
17.0000 g | PACK | Freq: Every day | ORAL | Status: DC
  Administered 2019-03-07 – 2019-03-12 (×6): 17 g via ORAL
  Filled 2019-03-07 (×7): qty 1

## 2019-03-07 MED ORDER — LORATADINE 10 MG PO TABS
10.0000 mg | ORAL_TABLET | Freq: Every day | ORAL | Status: DC
  Administered 2019-03-07 – 2019-03-12 (×6): 10 mg via ORAL
  Filled 2019-03-07 (×6): qty 1

## 2019-03-07 MED ORDER — LORAZEPAM 2 MG/ML IJ SOLN
INTRAMUSCULAR | Status: AC
  Filled 2019-03-07: qty 1

## 2019-03-07 MED ORDER — LORAZEPAM 2 MG/ML IJ SOLN
1.0000 mg | Freq: Once | INTRAMUSCULAR | Status: AC
  Administered 2019-03-07: 1 mg via INTRAVENOUS

## 2019-03-07 NOTE — Progress Notes (Signed)
Pt has had several episodes of severe pain this shift. Called to room multiple times by various staff members d/t pt c/o pain and nausea. Pt continues to c/o lower abdomen pain on the L side. States he feels like he is bleeding internally and nothing is alleviating his pain. Abdomen is soft and slightly distended. Is passing gas. Colostomy changed and full of green liquid stool. Pt states no aggravating factors for nausea. MD paged.
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
## 2019-03-07 NOTE — Progress Notes (Signed)
PT Cancellation Note  Patient Details Name: KENDARIUS VIGEN MRN: [id number] DOB: 1966-04-04   Cancelled Treatment:    Reason Eval/Treat Not Completed: (P) Pain limiting ability to participate;Medical issues which prohibited therapy(Pt with intolerable pain and nausea, PA in room assessing pt.  Will defer mobility at this time.)   Renette Hsu Eli Hose 03/07/2019, 11:59 AM Governor Rooks, PTA Acute Rehabilitation Services Pager [phone] Office [phone]

## 2019-03-07 NOTE — Progress Notes (Signed)
3 Days Post-Op  Subjective: Mr. Wisehart is very nauseous today and is coughing up some clear sputum. No fever, chills, vomiting.   He has not had anything to eat today due to nausea. Complaining of abdominal pain. Left hip is hurting, same as yesterday.   Objective: Vital signs in last 24 hours: Temp:  [98.1 F (36.7 C)-98.6 F (37 C)] 98.6 F (37 C) (09/24 0758) Pulse Rate:  [90-97] 90 (09/24 0758) Resp:  [15-20] 16 (09/24 0758) BP: (111-127)/(73-84) 124/79 (09/24 0758) SpO2:  [97 %-98 %] 97 % (09/24 0758) Last BM Date: 03/06/19  Intake/Output from previous day: 09/23 0701 - 09/24 0700 In: 1701.7 [P.O.:960; I.V.:241.7; IV Piggyback:500] Out: 1700 [Urine:1300; Stool:400] Intake/Output this shift: No intake/output data recorded.  General appearance: alert, cooperative, mild distress and anxious, laying in bed Head: Normocephalic, without obvious abnormality, atraumatic Neck: supple, symmetrical, trachea midline and enlarged submandibular nodes Resp: normal rise and fall, congested and coughing up sputum GI: TTP midline and peri-ostomy Male genitalia: suprapubic cath Extremities: extremities normal, atraumatic, no cyanosis or edema Pulses: 2+ and symmetric Skin: skin graft recipient and donor sites throughout left leg and lower/mid back.  Lymph nodes: Cervical adenopathy: submandibular Incision/Wound: Wound no longer has sorbact in place, packing placed by nursing yesterday. Small wound anterior to surgically created opening, appears to be healing blister from tape. surgical wound is ~ 3 x 2 x 3 cm. Posterior aspect of wound with small skin breakdown. No surrounding erythema.  Lab Results:  CBC Latest Ref Rng & Units 03/07/2019 03/06/2019 03/05/2019  WBC 4.0 - 10.5 K/uL 6.7 7.8 8.0  Hemoglobin 13.0 - 17.0 g/dL 10.4(L) 9.8(L) 10.4(L)  Hematocrit 39.0 - 52.0 % 35.0(L) 31.8(L) 33.8(L)  Platelets 150 - 400 K/uL 257 261 313    BMET Recent Labs    03/06/19 0609  03/07/19 0236  NA 135 140  K 3.0* 2.9*  CL 105 109  CO2 25 25  GLUCOSE 101* 89  BUN 10 13  CREATININE 0.44* 0.53*  CALCIUM 8.8* 9.2   PT/INR No results for input(s): LABPROT, INR in the last 72 hours. ABG No results for input(s): PHART, HCO3 in the last 72 hours.  Invalid input(s): PCO2, PO2  Studies/Results: No results found.  Anti-infectives: Anti-infectives (From admission, onward)   Start     Dose/Rate Route Frequency Ordered Stop   03/06/19 1200  vancomycin (VANCOCIN) 1,750 mg in sodium chloride 0.9 % 500 mL IVPB     1,750 mg 250 mL/hr over 120 Minutes Intravenous Every 24 hours 03/06/19 1103     03/04/19 1401  tobramycin (NEBCIN) powder  Status:  Discontinued       As needed 03/04/19 1401 03/04/19 1441   03/04/19 1359  vancomycin (VANCOCIN) powder  Status:  Discontinued       As needed 03/04/19 1400 03/04/19 1441   03/04/19 0600  ceFAZolin (ANCEF) IVPB 2g/100 mL premix     2 g 200 mL/hr over 30 Minutes Intravenous On call to O.R. 03/04/19 0000 03/04/19 1305   03/02/19 0600  vancomycin (VANCOCIN) 1,500 mg in sodium chloride 0.9 % 500 mL IVPB  Status:  Discontinued     1,500 mg 250 mL/hr over 120 Minutes Intravenous Every 24 hours 03/01/19 1658 03/06/19 1103   03/01/19 1000  vancomycin (VANCOCIN) 1,250 mg in sodium chloride 0.9 % 250 mL IVPB  Status:  Discontinued     1,250 mg 166.7 mL/hr over 90 Minutes Intravenous Every 12 hours 02/28/19 2119 03/01/19 1658   03/01/19  0500  ceFEPIme (MAXIPIME) 2 g in sodium chloride 0.9 % 100 mL IVPB     2 g 200 mL/hr over 30 Minutes Intravenous Every 8 hours 02/28/19 2119     02/28/19 2130  vancomycin (VANCOCIN) 1,500 mg in sodium chloride 0.9 % 500 mL IVPB     1,500 mg 250 mL/hr over 120 Minutes Intravenous  Once 02/28/19 2116 03/01/19 0011   02/28/19 2100  ceFEPIme (MAXIPIME) 2 g in sodium chloride 0.9 % 100 mL IVPB     2 g 200 mL/hr over 30 Minutes Intravenous  Once 02/28/19 2057 02/28/19 2147   02/28/19 2100  vancomycin  (VANCOCIN) IVPB 1000 mg/200 mL premix  Status:  Discontinued     1,000 mg 200 mL/hr over 60 Minutes Intravenous  Once 02/28/19 2057 02/28/19 2116      Assessment/Plan: s/p Procedure(s): POD3 Excision of hip wound with placement of Acell  It appears the sorbact mesh has been removed from the wound bed. Suspect likely from drainage causing vicryl to break down quicker than expected. Follow wound care recommendations and pack wound. May be able to apply bedside Acell powder pending patients comfort level and ability to tolerate procedure. Wound does not appear infected, no surrounding erythema.   Xeroform daily to healing blister anterior to surgically created wound on left hip.  If patient is unable to eat, try protein shake to increase protein and optimize wound healing/nutritional status.  VTE ppx with lovenox.    Updated patient's fiance about current plan from plastic surgery stance.   LOS: 7 days    Leslee Home, PA-C 03/07/2019

## 2019-03-07 NOTE — Progress Notes (Signed)
OT Cancellation Note  Patient Details Name: Christopher [name] MRN: [id number] DOB: Nov 04, 1965   Cancelled Treatment:    Reason Eval/Treat Not Completed: Pain limiting ability to participate. Pt with nausea. PA in the room assessing.   Christopher [name] 03/07/2019, 11:58 AM  Nestor Lewandowsky, OTR/L Acute Rehabilitation Services Pager: [phone] Office: [phone]
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
## 2019-03-07 NOTE — Progress Notes (Signed)
Received new orders from attending MD. Pt made aware of new plan of care. Will continue to comfort and monitor.
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
## 2019-03-07 NOTE — Plan of Care (Signed)
  Problem: Clinical Measurements: Goal: Will remain free from infection Outcome: Progressing   Problem: Coping: Goal: Level of anxiety will decrease Outcome: Progressing   

## 2019-03-08 DIAGNOSIS — M869 Osteomyelitis, unspecified: Secondary | ICD-10-CM

## 2019-03-08 LAB — CBC
HCT: 32.1 % — ABNORMAL LOW (ref 39.0–52.0)
Hemoglobin: 9.9 g/dL — ABNORMAL LOW (ref 13.0–17.0)
MCH: 31.8 pg (ref 26.0–34.0)
MCHC: 30.8 g/dL (ref 30.0–36.0)
MCV: 103.2 fL — ABNORMAL HIGH (ref 80.0–100.0)
Platelets: 286 10*3/uL (ref 150–400)
RBC: 3.11 MIL/uL — ABNORMAL LOW (ref 4.22–5.81)
RDW: 13.6 % (ref 11.5–15.5)
WBC: 5.6 10*3/uL (ref 4.0–10.5)
nRBC: 0 % (ref 0.0–0.2)

## 2019-03-08 LAB — AEROBIC/ANAEROBIC CULTURE W GRAM STAIN (SURGICAL/DEEP WOUND): Gram Stain: NONE SEEN

## 2019-03-08 LAB — BASIC METABOLIC PANEL
Anion gap: 9 (ref 5–15)
BUN: 10 mg/dL (ref 6–20)
CO2: 24 mmol/L (ref 22–32)
Calcium: 9.2 mg/dL (ref 8.9–10.3)
Chloride: 109 mmol/L (ref 98–111)
Creatinine, Ser: 0.43 mg/dL — ABNORMAL LOW (ref 0.61–1.24)
GFR calc Af Amer: >60 mL/min (ref >60)
GFR calc non Af Amer: >60 mL/min (ref >60)
Glucose, Bld: 78 mg/dL (ref 70–99)
Potassium: 3 mmol/L — ABNORMAL LOW (ref 3.5–5.1)
Sodium: 142 mmol/L (ref 135–145)

## 2019-03-08 MED ORDER — METRONIDAZOLE 500 MG PO TABS
500.0000 mg | ORAL_TABLET | Freq: Three times a day (TID) | ORAL | Status: DC
  Administered 2019-03-08 – 2019-03-12 (×13): 500 mg via ORAL
  Filled 2019-03-08 (×13): qty 1

## 2019-03-08 MED ORDER — MAGNESIUM SULFATE 2 GM/50ML IV SOLN
2.0000 g | Freq: Once | INTRAVENOUS | Status: AC
  Administered 2019-03-08: 2 g via INTRAVENOUS
  Filled 2019-03-08 (×2): qty 50

## 2019-03-08 NOTE — Progress Notes (Signed)
7 Days Post-Op  Subjective: Resting in bed, eating breakfast this AM.  Feeling a lot better today. Pain today in L hip in 8/10, but this is his baseline.  Abdominal pain resolved.  No complaints.  No recent fevers, chills, nausea, vomiting. Objective: Vital signs in last 24 hours: Temp:  [97.6 F (36.4 C)-98.4 F (36.9 C)] 97.8 F (36.6 C) (09/28 0815) Pulse Rate:  [70-91] 91 (09/28 0815) Resp:  [16-20] 16 (09/28 0815) BP: (122-142)/(81-94) 139/81 (09/28 0815) SpO2:  [97 %-100 %] 99 % (09/28 0815) Last BM Date: 03/10/19  Intake/Output from previous day: 09/27 0701 - 09/28 0700 In: 960 [P.O.:960] Out: 3550 [Urine:2500; Stool:1050] Intake/Output this shift: No intake/output data recorded.  General appearance: alert, cooperative, no distress and resting in bed Head: Normocephalic, without obvious abnormality, atraumatic Neck: supple, symmetrical, trachea midline and submandibular LAD GI: soft, non-tender; bowel sounds normal; no masses,  no organomegaly and midline scar, ostomy bag in place Male genitalia: normal, suprapubic cath in place Extremities: extremities normal, atraumatic, no cyanosis or edema and left hip wound with dressing in place. L anterior hip wound with xeroform in place Pulses: 2+ and symmetric Skin: multiple skin graft sites throughout back and LLE Neurologic: Grossly normal Incision/Wound: Wound is ~ 2.5 x 5 x 4 cm. Medial and inferiorly there is some breakdown of epithelium. Surrounding skin non-erythematous. No purulent drainage noted.   Lab Results:  CBC Latest Ref Rng & Units 03/11/2019 03/10/2019 03/09/2019  WBC 4.0 - 10.5 K/uL 5.2 5.9 5.5  Hemoglobin 13.0 - 17.0 g/dL 9.8(L) 9.3(L) 9.9(L)  Hematocrit 39.0 - 52.0 % 31.4(L) 30.8(L) 32.0(L)  Platelets 150 - 400 K/uL 312 314 284   CBC    Component Value Date/Time   WBC 5.2 03/11/2019 0355   RBC 3.04 (L) 03/11/2019 0355   HGB 9.8 (L) 03/11/2019 0355   HCT 31.4 (L) 03/11/2019 0355   PLT 312  03/11/2019 0355   MCV 103.3 (H) 03/11/2019 0355   MCH 32.2 03/11/2019 0355   MCHC 31.2 03/11/2019 0355   RDW 13.5 03/11/2019 0355   LYMPHSABS 1.5 03/04/2019 0519   MONOABS 0.5 03/04/2019 0519   EOSABS 0.4 03/04/2019 0519   BASOSABS 0.0 03/04/2019 0519     BMET Recent Labs    03/10/19 0343 03/11/19 0355  NA 143 142  K 2.8* 3.7  CL 110 111  CO2 25 25  GLUCOSE 86 83  BUN 12 13  CREATININE 0.46* 0.51*  CALCIUM 9.2 9.3   PT/INR No results for input(s): LABPROT, INR in the last 72 hours. ABG No results for input(s): PHART, HCO3 in the last 72 hours.  Invalid input(s): PCO2, PO2  Studies/Results: No results found.  Anti-infectives: Anti-infectives (From admission, onward)   Start     Dose/Rate Route Frequency Ordered Stop   03/08/19 1400  metroNIDAZOLE (FLAGYL) tablet 500 mg     500 mg Oral Every 8 hours 03/08/19 1339     03/06/19 1200  vancomycin (VANCOCIN) 1,750 mg in sodium chloride 0.9 % 500 mL IVPB  Status:  Discontinued     1,750 mg 250 mL/hr over 120 Minutes Intravenous Every 24 hours 03/06/19 1103 03/07/19 1407   03/04/19 1401  tobramycin (NEBCIN) powder  Status:  Discontinued       As needed 03/04/19 1401 03/04/19 1441   03/04/19 1359  vancomycin (VANCOCIN) powder  Status:  Discontinued       As needed 03/04/19 1400 03/04/19 1441   03/04/19 0600  ceFAZolin (ANCEF) IVPB 2g/100  mL premix     2 g 200 mL/hr over 30 Minutes Intravenous On call to O.R. 03/04/19 0000 03/04/19 1305   03/02/19 0600  vancomycin (VANCOCIN) 1,500 mg in sodium chloride 0.9 % 500 mL IVPB  Status:  Discontinued     1,500 mg 250 mL/hr over 120 Minutes Intravenous Every 24 hours 03/01/19 1658 03/06/19 1103   03/01/19 1000  vancomycin (VANCOCIN) 1,250 mg in sodium chloride 0.9 % 250 mL IVPB  Status:  Discontinued     1,250 mg 166.7 mL/hr over 90 Minutes Intravenous Every 12 hours 02/28/19 2119 03/01/19 1658   03/01/19 0500  ceFEPIme (MAXIPIME) 2 g in sodium chloride 0.9 % 100 mL IVPB     2  g 200 mL/hr over 30 Minutes Intravenous Every 8 hours 02/28/19 2119     02/28/19 2130  vancomycin (VANCOCIN) 1,500 mg in sodium chloride 0.9 % 500 mL IVPB     1,500 mg 250 mL/hr over 120 Minutes Intravenous  Once 02/28/19 2116 03/01/19 0011   02/28/19 2100  ceFEPIme (MAXIPIME) 2 g in sodium chloride 0.9 % 100 mL IVPB     2 g 200 mL/hr over 30 Minutes Intravenous  Once 02/28/19 2057 02/28/19 2147   02/28/19 2100  vancomycin (VANCOCIN) IVPB 1000 mg/200 mL premix  Status:  Discontinued     1,000 mg 200 mL/hr over 60 Minutes Intravenous  Once 02/28/19 2057 02/28/19 2116      Assessment/Plan: s/p Procedure(s): POD7 Excision of hip wound with placement of Acell  Acell powder and sheet applied to left posterior hip wound and left anterolateral hip wound.   Daily KY jelly/surgilube to the green mesh. Apply KY to 4x4  gauze then place over green mesh (sorbact). Please call if  any questions in regards to wound dressing changes.  Duoderm placed around wound to protect skin from tape.   Hgb stable.  Low Albumin - poor wound healing. Increase protein and optimize nutritional status for optimal healing.   Cefepime and metronidazole for 6 weeks - ending April 14, 2019 for osteomyelitis.   LOS: 11 days    Leslee Home, PA-C 03/11/2019
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
## 2019-03-08 NOTE — Progress Notes (Signed)
PHARMACY CONSULT NOTE FOR:  OUTPATIENT  PARENTERAL ANTIBIOTIC THERAPY (OPAT)  Indication: Osteomyelitis/Wound infection Regimen: Cefepime 2g IV q8h End date: 04/14/2019  IV antibiotic discharge orders are pended. To discharging provider:  please sign these orders via discharge navigator,  Select New Orders & click on the button choice - Manage This Unsigned Work.     Thank you for allowing pharmacy to be a part of this patient's care.  Phillis Haggis 03/08/2019, 10:04 AM

## 2019-03-08 NOTE — Progress Notes (Signed)
OT Cancellation Note  Patient Details Name: Christopher [name] MRN: [id number] DOB: [birth year]   Cancelled Treatment:    Reason Eval/Treat Not Completed: Pain limiting ability to participate;Fatigue/lethargy limiting ability to participate. Attempted in a.m., pt with pain, RN made aware and administered pain meds, Returned mid day, pt sleeping, did not disturb. Will continue efforts.  Malka So 03/08/2019, 12:11 PM  Nestor Lewandowsky, OTR/L Acute Rehabilitation Services Pager: [phone] Office: [phone]

## 2019-03-08 NOTE — Progress Notes (Signed)
Spoke with RN re: PICC placement, PICC will be placed 9/26 as per patient request. Consent signed by patient.

## 2019-03-08 NOTE — Plan of Care (Addendum)
  Problem: Coping: Goal: Level of anxiety will decrease Outcome: Progressing   Problem: Clinical Measurements: Goal: Will remain free from infection Outcome: Progressing   L Hip dsg changed per MD order. Colostomy with yellowish brown mushy output, Moderate amount. Suprapubic bag below bladder level with adequate output.  CHG completed per MD orders.

## 2019-03-08 NOTE — TOC Initial Note (Signed)
Transition of Care [facility]) - Initial/Assessment Note    Patient Details  Name: Christopher [name] MRN: [id number] Date of Birth: Nov 03, 1965  [facility] CM/SW Contact:    Donnie Coffin, LCSW Phone Number: 03/08/2019, 11:50 AM  Clinical Narrative:                  CSW has been working with patients workers comp, Scientist, forensic, for discharge planning services- patient is already active [facility] for RN and PT. Services will start up again once discharged.   Per notes, patient may be discharge on IV abx- if so his workers comp will be able to provide all services. Continuing to follow for discharge planning services.   Laretta Bolster with [employer]: [phone]   Expected Discharge Plan: Home w Home Health Services Barriers to Discharge: Continued Medical Work up   Patient Goals and CMS Choice        Expected Discharge Plan and Services Expected Discharge Plan: Home w Home Health Services In-house Referral: Clinical Social Work                                 HH Arranged: PT, OT HH Agency[facility])        Prior Living Arrangements/Services   Lives with:: Self Patient language and need for interpreter reviewed:: Yes Do you feel safe going back to the place where you live?: Yes      Need for Family Participation in Patient Care: Yes (Comment) Care giver support system in place?: Yes (comment) Current home services: DME, Home OT, Home PT    Activities of Daily Living Home Assistive Devices/Equipment: Wheelchair, Environmental consultant (specify type), Other (Comment) ADL Screening (condition at time of admission) Patient's cognitive ability adequate to safely complete daily activities?: Yes Is the patient deaf or have difficulty hearing?: No Does the patient have difficulty seeing, even when wearing glasses/contacts?: No Does the patient have difficulty concentrating, remembering, or making  decisions?: No Patient able to express need for assistance with ADLs?: Yes Does the patient have difficulty dressing or bathing?: Yes Independently performs ADLs?: No Communication: Dependent Dressing (OT): Dependent Grooming: Dependent Feeding: Dependent Bathing: Dependent Toileting: Dependent In/Out Bed: Dependent Walks in Home: Dependent Does the patient have difficulty walking or climbing stairs?: Yes Weakness of Legs: Both Weakness of Arms/Hands: Both  Permission Sought/Granted                  Emotional Assessment           Psych Involvement: No (comment)  Admission diagnosis:  Wound infection [T14.8XXA, L08.9] Patient Active Problem List   Diagnosis Date Noted  . Chronic osteomyelitis of pelvis, left (HCC) 03/07/2019  . Wound infection, posttraumatic 02/28/2019  . Hydronephrosis, right 02/28/2019  . GERD (gastroesophageal reflux disease) 02/28/2019  . Suprapubic catheter (HCC) 02/28/2019  . Protein-calorie malnutrition, moderate (HCC) 02/28/2019  . 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 Reactive depression 01/29/2019  . Chronic pain due to trauma 01/29/2019  . Neuropathic pain 01/29/2019  . Colostomy status (HCC) 01/14/2019  . Insomnia 01/14/2019  . Tachycardia 01/14/2019  . Current use of proton pump inhibitor 01/14/2019  . Wound of buttock 12/28/2018  . Anxiety and depression 12/17/2018  . Debility 11/23/2018  . Chronic pain syndrome   . Multiple traumatic injuries   . Pressure injury of skin 08/13/2018  . Urethral  injury 07/13/2018   PCP:  Vivi Barrack, MD Pharmacy:   [id number] - [place], [place] - [address] [place] [address] Phone: [phone] Fax: [phone]  [facility] [place], [place] AT [place] [address] Phone: [phone] Fax: [phone]     Social  Determinants of Health (SDOH) Interventions    Readmission Risk Interventions Readmission Risk Prevention Plan 10/16/2018  Transportation Screening Complete  Medication Review Press photographer) Complete  PCP or Specialist appointment within 3-5 days of discharge Not Complete  PCP/Specialist Appt Not Complete comments Pt discharging to [place] or [place] Not Complete  HRI or Home Care Consult Pt Refusal Comments Pt discharging to LTAC  SW Recovery Care/Counseling Consult Not Complete  SW Consult Not Complete Comments Pt discharging to [place] Not Applicable  Some recent data might be hidden

## 2019-03-08 NOTE — Progress Notes (Addendum)
[facility] for Infectious Disease   Reason for visit: Follow up on osteomyelitis  Interval History: no associated rash or diarrhea.  WBC wnl, no new complaints.  Has remained afebrile.    Physical Exam: Constitutional:  Vitals:   03/08/19 0310 03/08/19 0914  BP: (!) 129/94 (!) 129/91  Pulse: 90 91  Resp: 18 18  Temp: 97.6 F (36.4 C) 98.6 F (37 C)  SpO2: 99% 98%   patient appears in NAD Respiratory: Normal respiratory effort; CTA B Cardiovascular: RRR Skin: no rashes  Review of Systems: Constitutional: negative for fevers, chills and anorexia Respiratory: negative for cough Integument/breast: negative for rash  Lab Results  Component Value Date   WBC 5.6 03/08/2019   HGB 9.9 (L) 03/08/2019   HCT 32.1 (L) 03/08/2019   MCV 103.2 (H) 03/08/2019   PLT 286 03/08/2019    Lab Results  Component Value Date   CREATININE 0.43 (L) 03/08/2019   BUN 10 03/08/2019   NA 142 03/08/2019   K 3.0 (L) 03/08/2019   CL 109 03/08/2019   CO2 24 03/08/2019    Lab Results  Component Value Date   ALT 12 03/04/2019   AST 14 (L) 03/04/2019   ALKPHOS 99 03/04/2019     Microbiology: Recent Results (from the past 240 hour(s))  Blood culture (routine x 2)     Status: None   Collection Time: 02/28/19  8:45 PM   Specimen: BLOOD  Result Value Ref Range Status   Specimen Description BLOOD RIGHT ANTECUBITAL  Final   Special Requests   Final    BOTTLES DRAWN AEROBIC AND ANAEROBIC Blood Culture results may not be optimal due to an inadequate volume of blood received in culture bottles   Culture   Final    NO GROWTH 5 DAYS Performed at [facility], [place] [address], [place], [place] [address]    Report Status 03/05/2019 FINAL  Final  Blood culture (routine x 2)     Status: None   Collection Time: 02/28/19  8:50 PM   Specimen: BLOOD LEFT HAND  Result Value Ref Range Status   Specimen Description BLOOD LEFT HAND  Final   Special Requests   Final    BOTTLES DRAWN AEROBIC  ONLY Blood Culture results may not be optimal due to an inadequate volume of blood received in culture bottles   Culture   Final    NO GROWTH 5 DAYS Performed at [facility], [place] [address] [place], [place] [address]    Report Status 03/05/2019 FINAL  Final  Urine culture     Status: Abnormal   Collection Time: 02/28/19 10:22 PM   Specimen: Urine, Random  Result Value Ref Range Status   Specimen Description URINE, RANDOM  Final   Special Requests NONE  Final   Culture (A)  Final    <10,000 COLONIES/mL INSIGNIFICANT GROWTH Performed at [facility], [place] [address], [place], [place] [address]    Report Status 03/02/2019 FINAL  Final  SARS CORONAVIRUS 2 (TAT 6-24 HRS) Nasopharyngeal Nasopharyngeal Swab     Status: None   Collection Time: 02/28/19 10:44 PM   Specimen: Nasopharyngeal Swab  Result Value Ref Range Status   SARS Coronavirus 2 NEGATIVE NEGATIVE Final    Comment: (NOTE) SARS-CoV-2 target nucleic acids are NOT DETECTED. The SARS-CoV-2 RNA is generally detectable in upper and lower respiratory specimens during the acute phase of infection. Negative results do not preclude SARS-CoV-2 infection, do not rule  out co-infections with other pathogens, and should not be used as the sole basis for treatment or other patient management decisions. Negative results must be combined with clinical observations, patient history, and epidemiological information. The expected result is Negative. Fact Sheet for Patients: HairSlick.no Fact Sheet for Healthcare Providers: quierodirigir.com This test is not yet approved or cleared by the [place] FDA and  has been authorized for detection and/or diagnosis of SARS-CoV-2 by FDA under an Emergency Use Authorization (EUA). This EUA will remain  in effect (meaning this test can be used) for the duration of the COVID-19 declaration under Section 56 4(b)(1) of the Act, 21  U.S.C. section 360bbb-3(b)(1), unless the authorization is terminated or revoked sooner. Performed at [facility], [address], [place], [place] [address]   Aerobic/Anaerobic Culture (surgical/deep wound)     Status: None (Preliminary result)   Collection Time: 03/04/19  1:30 PM   Specimen: Wound; Tissue  Result Value Ref Range Status   Specimen Description TISSUE WOUND LEFT PELVIS  Final   Special Requests NONE  Final   Gram Stain   Final    RARE WBC PRESENT,BOTH PMN AND MONONUCLEAR FEW GRAM POSITIVE COCCI RARE GRAM VARIABLE ROD    Culture   Final    HOLDING FOR POSSIBLE ANAEROBE Performed at [facility], [address], [place], [place] [address]    Report Status PENDING  Incomplete  Aerobic/Anaerobic Culture (surgical/deep wound)     Status: None (Preliminary result)   Collection Time: 03/04/19  1:40 PM   Specimen: Wound  Result Value Ref Range Status   Specimen Description WOUND LEFT PELVIS  Final   Special Requests SWAB SPEC B  Final   Gram Stain NO WBC SEEN RARE GRAM VARIABLE ROD   Final   Culture   Final    HOLDING FOR POSSIBLE ANAEROBE Performed at [facility], [address] [place], [place] [address]    Report Status PENDING  Incomplete    Impression/Plan:  1. Osteomyelitis of pelvis, culture negative - s/p excision of infected bone.  Based on previous cultures in blood and wound I will have him continue with cefepime.  I have stopped vancomycin.  He will need 6 weeks through November 1. Will follow inflammatory markers.   I will arrange follow up with me in 3-4 weeks  ADDENDUM: will add oral flagyl since there is now some anaerobic growth and treat for same duration with cefepime.    2. Access - I have ordered a picc line for long term access.    3.  Disposition - ok from ID standpoint for discharge.  Dr. Daiva Eves will continue to monitor the culture while inpatient over the weekend.  We will monitor as an outpatient and can make  changes after discharge if needed.  OPAT order placed.

## 2019-03-08 NOTE — Progress Notes (Addendum)
Pt's workers comp case manage is Maudry Diego with [place].  Phone # [phone] Fax # [phone]

## 2019-03-08 NOTE — Progress Notes (Signed)
PT Cancellation Note  Patient Details Name: Christopher [name] MRN: [id number] DOB: [birth year]   Cancelled Treatment:    Reason Eval/Treat Not Completed: Pain limiting ability to participate;Fatigue/lethargy limiting ability to participate. PT attempted to see patient three times on this date. Upon first attempt pt in significant pain and deferring PT session until later in day. Upon 2nd attempt pt writhing in pain, RN present and administering pain medications at this time. Upon 3rd attempt pt sleeping and resting peacefully, PT declining to disturb at this time. PT will continue efforts as time allows.  Zenaida Niece 03/08/2019, 2:39 PM

## 2019-03-08 NOTE — Progress Notes (Signed)
Pharmacy Antibiotic Note  Christopher [name] is a 53 y.o. male admitted on 02/28/2019 with chronic osteomyelitis (hx of pseudomonas). Covering for chronic left hip/gluteus wound and UTI. Has suprapubic catheter.  Scr stable Cultures negative to date  Plan: Vancomycin stopped Continue Cefepime 2 grams iv Q 8 hours  ID now following (long course of cefepime likely planned) Will follow peripherally for changes in renal function / dosing  Height: 6\' 3"  (190.5 cm) Weight: 134 lb 0.6 oz (60.8 kg) IBW/kg (Calculated) : 84.5  Temp (24hrs), Avg:98.1 F (36.7 C), Min:97.6 F (36.4 C), Max:98.9 F (37.2 C)  Recent Labs  Lab 03/04/19 0519 03/05/19 0359 03/05/19 0506 03/06/19 0609 03/06/19 0948 03/07/19 0236 03/08/19 0455  WBC 5.4  --  8.0 7.8  --  6.7 5.6  CREATININE 0.49* 0.68  --  0.44*  --  0.53* 0.43*  VANCOTROUGH  --   --   --  4*  --   --   --   VANCOPEAK  --   --   --   --  <4*  --   --     Estimated Creatinine Clearance: 92.9 mL/min (A) (by C-G formula based on SCr of 0.43 mg/dL (L)).    No Active Allergies   Thank you Anette Guarneri, PharmD 03/08/2019 8:39 AM

## 2019-03-09 LAB — AEROBIC/ANAEROBIC CULTURE W GRAM STAIN (SURGICAL/DEEP WOUND)

## 2019-03-09 LAB — BASIC METABOLIC PANEL
Anion gap: 9 (ref 5–15)
BUN: 13 mg/dL (ref 6–20)
CO2: 23 mmol/L (ref 22–32)
Calcium: 9 mg/dL (ref 8.9–10.3)
Chloride: 110 mmol/L (ref 98–111)
Creatinine, Ser: 0.39 mg/dL — ABNORMAL LOW (ref 0.61–1.24)
GFR calc Af Amer: >60 mL/min (ref >60)
GFR calc non Af Amer: >60 mL/min (ref >60)
Glucose, Bld: 85 mg/dL (ref 70–99)
Potassium: 2.9 mmol/L — ABNORMAL LOW (ref 3.5–5.1)
Sodium: 142 mmol/L (ref 135–145)

## 2019-03-09 LAB — CBC
HCT: 32 % — ABNORMAL LOW (ref 39.0–52.0)
Hemoglobin: 9.9 g/dL — ABNORMAL LOW (ref 13.0–17.0)
MCH: 31.9 pg (ref 26.0–34.0)
MCHC: 30.9 g/dL (ref 30.0–36.0)
MCV: 103.2 fL — ABNORMAL HIGH (ref 80.0–100.0)
Platelets: 284 10*3/uL (ref 150–400)
RBC: 3.1 MIL/uL — ABNORMAL LOW (ref 4.22–5.81)
RDW: 13.6 % (ref 11.5–15.5)
WBC: 5.5 10*3/uL (ref 4.0–10.5)
nRBC: 0 % (ref 0.0–0.2)

## 2019-03-09 LAB — MAGNESIUM: Magnesium: 1.9 mg/dL (ref 1.7–2.4)

## 2019-03-09 MED ORDER — SODIUM CHLORIDE 0.9% FLUSH: 10.0000 mL | INTRAVENOUS | Status: DC | PRN

## 2019-03-09 MED ORDER — SODIUM CHLORIDE 0.9% FLUSH
10.0000 mL | Freq: Two times a day (BID) | INTRAVENOUS | Status: DC
  Administered 2019-03-09 – 2019-03-12 (×6): 10 mL

## 2019-03-09 MED ORDER — POTASSIUM CHLORIDE CRYS ER 20 MEQ PO TBCR
20.0000 meq | EXTENDED_RELEASE_TABLET | Freq: Once | ORAL | Status: AC
  Administered 2019-03-09: 20 meq via ORAL
  Filled 2019-03-09: qty 1
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
## 2019-03-09 NOTE — Progress Notes (Signed)
Peripherally Inserted Central Catheter/Midline Placement  The IV Nurse has discussed with the patient and/or persons authorized to consent for the patient, the purpose of this procedure and the potential benefits and risks involved with this procedure.  The benefits include less needle sticks, lab draws from the catheter, and the patient may be discharged home with the catheter. Risks include, but not limited to, infection, bleeding, blood clot (thrombus formation), and puncture of an artery; nerve damage and irregular heartbeat and possibility to perform a PICC exchange if needed/ordered by physician.  Alternatives to this procedure were also discussed.  Bard Power PICC patient education guide, fact sheet on infection prevention and patient information card has been provided to patient /or left at bedside.    PICC/Midline Placement Documentation  PICC Single Lumen 03/09/19 PICC Left Brachial 43 cm 0 cm (Active)  Indication for Insertion or Continuance of Line Prolonged intravenous therapies 03/09/19 1300  Exposed Catheter (cm) 0 cm 03/09/19 1300  Site Assessment Clean;Dry;Intact 03/09/19 1300  Line Status Flushed;[place] locked;Blood return noted 03/09/19 1300  Dressing Type Transparent 03/09/19 1300  Dressing Status Clean;Dry;Intact;Antimicrobial disc in place 03/09/19 [place] checked and tightened 03/09/19 1300  Line Adjustment (NICU/IV Team Only) No 03/09/19 1300  Dressing Intervention New dressing 03/09/19 1300  Dressing Change Due 03/16/19 03/09/19 1300       Rolena Infante 03/09/2019, 1:02 PM

## 2019-03-09 NOTE — Plan of Care (Signed)
  Problem: Education: Goal: Knowledge of General Education information will improve Description: Including pain rating scale, medication(s)/side effects and non-pharmacologic comfort measures Outcome: Progressing   Problem: Coping: Goal: Level of anxiety will decrease Outcome: Progressing   

## 2019-03-10 DIAGNOSIS — B9689 Other specified bacterial agents as the cause of diseases classified elsewhere: Secondary | ICD-10-CM

## 2019-03-10 DIAGNOSIS — R5383 Other fatigue: Secondary | ICD-10-CM

## 2019-03-10 DIAGNOSIS — M86652 Other chronic osteomyelitis, left thigh: Secondary | ICD-10-CM

## 2019-03-10 DIAGNOSIS — E440 Moderate protein-calorie malnutrition: Secondary | ICD-10-CM

## 2019-03-10 LAB — CBC
HCT: 30.8 % — ABNORMAL LOW (ref 39.0–52.0)
Hemoglobin: 9.3 g/dL — ABNORMAL LOW (ref 13.0–17.0)
MCH: 31.3 pg (ref 26.0–34.0)
MCHC: 30.2 g/dL (ref 30.0–36.0)
MCV: 103.7 fL — ABNORMAL HIGH (ref 80.0–100.0)
Platelets: 314 10*3/uL (ref 150–400)
RBC: 2.97 MIL/uL — ABNORMAL LOW (ref 4.22–5.81)
RDW: 13.4 % (ref 11.5–15.5)
WBC: 5.9 10*3/uL (ref 4.0–10.5)
nRBC: 0 % (ref 0.0–0.2)

## 2019-03-10 LAB — BASIC METABOLIC PANEL
Anion gap: 8 (ref 5–15)
BUN: 12 mg/dL (ref 6–20)
CO2: 25 mmol/L (ref 22–32)
Calcium: 9.2 mg/dL (ref 8.9–10.3)
Chloride: 110 mmol/L (ref 98–111)
Creatinine, Ser: 0.46 mg/dL — ABNORMAL LOW (ref 0.61–1.24)
GFR calc Af Amer: >60 mL/min (ref >60)
GFR calc non Af Amer: >60 mL/min (ref >60)
Glucose, Bld: 86 mg/dL (ref 70–99)
Potassium: 2.8 mmol/L — ABNORMAL LOW (ref 3.5–5.1)
Sodium: 143 mmol/L (ref 135–145)

## 2019-03-10 MED ORDER — POTASSIUM CHLORIDE 20 MEQ PO PACK
40.0000 meq | PACK | Freq: Three times a day (TID) | ORAL | Status: DC
  Administered 2019-03-10: 40 meq via ORAL

## 2019-03-10 MED ORDER — POTASSIUM CHLORIDE CRYS ER 20 MEQ PO TBCR
40.0000 meq | EXTENDED_RELEASE_TABLET | Freq: Three times a day (TID) | ORAL | Status: DC
  Administered 2019-03-10 – 2019-03-12 (×7): 40 meq via ORAL
  Filled 2019-03-10 (×7): qty 2

## 2019-03-10 NOTE — Plan of Care (Signed)
  Problem: Education: Goal: Knowledge of General Education information will improve Description: Including pain rating scale, medication(s)/side effects and non-pharmacologic comfort measures 03/10/2019 1329 by Trixie Deis, RN Outcome: Progressing 03/10/2019 1328 by Trixie Deis, RN Outcome: Progressing   Problem: Clinical Measurements: Goal: Will remain free from infection 03/10/2019 1329 by Trixie Deis, RN Outcome: Progressing 03/10/2019 1328 by Trixie Deis, RN Outcome: Progressing   Problem: Coping: Goal: Level of anxiety will decrease 03/10/2019 1329 by Trixie Deis, RN Outcome: Progressing 03/10/2019 1328 by Trixie Deis, RN Outcome: Progressing

## 2019-03-10 NOTE — Progress Notes (Addendum)
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 Pt states that hewas involved in averysignificant trauma in January and has had chronic woundin leftbuttock areasince that time.Pt isfollowing up with plastic surgery as outpatient for wound management.He notedincreased drainage in the past several days.He hadmild fever at home. He is body temperature is 97.9 in ED. Patient was started with doxycycline last Thursday without improvement. He continues to have worsening pain, which is constant, sharp, 9 out of 10 in severity, nonradiating. Patient does not have chest pain, shortness breath, cough, nausea, vomiting, diarrhea, abdominal pain, symptoms of UTI. In ED pt was found to have WBC 5.8, lactic acid 1.5, negative COVID-19 test, electrolytes renal function okay, temperature normal, blood pressure 121/79, heart rate 94, oxygen saturation 94-97% on room air. Chest x-ray negative. CT of pelvisshowed possible chronic osteomyelitis,and kidney stone andright mild hydronephrosis. Patient is admitted to [facility] bed for observation. Urology,Dr. Junious Silk was consulted by EDP--Since patient's renal function is normal, pt will be appropriate for outpatient follow-up with urology.  Status-post removal of hardware pelvis and excision of superficial bone pelvis for osteomyelitis by ortho, Dr. Doreatha Martin and Dr. Marla Roe on 03/04/19. Cultures remain negative.PICC line has been placed infectious disease team has recommended 6 weeks of antibiotics  Assessment/Plan: Left posterior hip wound/chronic osteomyelitis -Remained stable this morning, no  acute distress afebrile normotensive -S/P removal of hardware pelvis and excision of superficial bone pelvis for osteomyelitis 03/04/19 by ortho, Dr. Doreatha Martin and Dr. Marla Roe -Elevated ESR and CRP - typical given osteomyelitis - trend per surgery -repeat ESR and CRP on 9/27 Continue IV antibiotics empirically. D/Ced IV vancomycin on 9/24, Cont. Cefepime and metronidazole x 6 wks  -Surgical wound Gram stain grew gram-positive cocci and rare gram variable rods = Bacteroides distasonis -surgical Wound culture ---Bacteroides distasonis -History of positive wound cultures with Pseudomonas and E. coli in the past. -Infectious disease consulted: -PICC line placement 03/09/2019 -Dr. Linus Salmons; [place] IV vancomycin, recommended to continue  Cefepime and metronidazole for total of 6 weeks -end date April 14, 2019 -Plastic surgery and orthopedic surgery following    Hypokalemia, ongoing -Potassium at 3.59m>> 2.8 this morning, he is on scheduled 40 mEq, will add at 20 more week for total of 60 mEq of KCl today. - received 2 g of IV magnesium 9/25-->recheck mag -Unclear etiology - GI loss vs poor PO intake  -increase K to 40 mgEq tid  Severe protein calorie malnutrition BMI of 16 with albumin of 2.6 Nutritionist to assist with nutritional needs Encourage oral protein calorie intake Continue oral supplement  Physical debility/ambulatory dysfunction PT OT following -patient is unable to participate much due to pain/fatigue/at times lethargic due to medication -he has little motivation to participate Fall precautions  Asymptomatic pyuria - UTI ruled out Presented with pyuria but with insignificant growth on urine culture Currently on IV cefepime  Chronic pain syndrome Not opiate nave --continue current pain regiment On high doses of opiates at home Continue home fentanyl patch 50 mcg/h every other day Continue gabapentin 300 mg 3 times daily, Robaxin 500 mg every 6 hours as needed for  muscle spasm Continue Percocet 5-325 mg 1 tablet every 4 hours as needed for  severe pain  Chronic anxiety/depression -Continue home medication Celexa and Klonopin prn -Patient seems to have periodic anxiety attacks, PRN Ativan will be considered -More stable now  GERD Continue home medication  Right hydronephrosis with nephrolithiasis Suprapubic catheter in place - chronic Continue to monitor urine output and renal function -renal function remains stable  GERD Continue Protonix  Status post colostomy/suprapubic catheter,  Remained stable, functional positive stool and urine. Continue    Social worker/case management team has been notified -appreciate their assistance   Consults: Urology,Dr. Criss Alvine consulted, plastic surgery, orthopedic surgery. -Consulted infectious disease team Dr. Novella Olive 03/07/2019        Disposition Plan:   Home when pain controlled and K is improved/stable  Patient is currently not appropriate for discharge due to persistent symptomatology requiring IV pain medications and IV antibiotics post surgical intervention. -Anticipating discharge home 1-2 days  Family Communication:  No Family at bedside  Consultants:  Urology,Dr. Criss Alvine consulted, plastic surgery, orthopedic surgery ([place]). -infectious disease team Dr. Linus Salmons 03/07/2019  Code Status:  FULL  DVT Prophylaxis:  [place] Lovenox   Procedures: As Listed in Progress Note Above       Subjective: Pt complains of pain in pelvis about same.  Denies f/c, cp, sob, n/v  Objective: Vitals:   03/09/19 1500 03/09/19 1952 03/10/19 0349 03/10/19 0809  BP: 125/69 (!) 144/90 (!) 138/97 (!) 117/93  Pulse: 74 75 85 70  Resp: '18 18 18 18  ' Temp: 98.6 F (37 C) 98.8 F (37.1 C) 97.7 F (36.5 C) (!) 97.5 F (36.4 C)  TempSrc: Oral Oral Oral Oral  SpO2: 98% 98% 98% 98%  Weight:      Height:        Intake/Output Summary (Last 24 hours) at 03/10/2019 0939 Last data  filed at 03/10/2019 0300 Gross per 24 hour  Intake 960 ml  Output 1100 ml  Net -140 ml   Weight change:  Exam:   General:  Pt is alert, follows commands appropriately, not in acute distress  HEENT: No icterus, No thrush, No neck mass, [place]/AT  Cardiovascular: RRR, S1/S2, no rubs, no gallops  Respiratory: bibasilar rales, no wheeze  Abdomen: Soft/+BS, tender around ostomy, non distended, no guarding  Extremities: No edema, No lymphangitis, No petechiae, No rashes, no synovitis   Data Reviewed: I have personally reviewed following labs and imaging studies Basic Metabolic Panel: Recent Labs  Lab 03/06/19 0609 03/07/19 0236 03/07/19 0922 03/08/19 0455 03/09/19 0349 03/10/19 0343  NA 135 140  --  142 142 143  K 3.0* 2.9*  --  3.0* 2.9* 2.8*  CL 105 109  --  109 110 110  CO2 25 25  --  '24 23 25  ' GLUCOSE 101* 89  --  78 85 86  BUN 10 13  --  '10 13 12  ' CREATININE 0.44* 0.53*  --  0.43* 0.39* 0.46*  CALCIUM 8.8* 9.2  --  9.2 9.0 9.2  MG  --   --  2.0  --  1.9  --    Liver Function Tests: Recent Labs  Lab 03/04/19 0519  AST 14*  ALT 12  ALKPHOS 99  BILITOT 0.4  PROT 7.1  ALBUMIN 2.6*   No results for input(s): LIPASE, AMYLASE in the last 168 hours. No results for input(s): AMMONIA in the last 168 hours. Coagulation Profile: No results for input(s): INR, PROTIME in the last 168 hours. CBC: Recent Labs  Lab 03/04/19 0519  03/06/19 0609 03/07/19 0236 03/08/19 0455 03/09/19  1470 03/10/19 0343  WBC 5.4   < > 7.8 6.7 5.6 5.5 5.9  NEUTROABS 3.0  --   --   --   --   --   --   HGB 11.8*   < > 9.8* 10.4* 9.9* 9.9* 9.3*  HCT 36.9*   < > 31.8* 35.0* 32.1* 32.0* 30.8*  MCV 101.9*   < > 101.9* 104.5* 103.2* 103.2* 103.7*  PLT 329   < > 261 257 286 284 314   < > = values in this interval not displayed.   Cardiac Enzymes: No results for input(s): CKTOTAL, CKMB, CKMBINDEX, TROPONINI in the last 168 hours. BNP: Invalid input(s): POCBNP CBG: No results for input(s):  GLUCAP in the last 168 hours. HbA1C: No results for input(s): HGBA1C in the last 72 hours. Urine analysis:    Component Value Date/Time   COLORURINE YELLOW 03/01/2019 0945   APPEARANCEUR HAZY (A) 03/01/2019 0945   LABSPEC 1.027 03/01/2019 0945   PHURINE 5.0 03/01/2019 0945   GLUCOSEU NEGATIVE 03/01/2019 0945   HGBUR NEGATIVE 03/01/2019 0945   BILIRUBINUR NEGATIVE 03/01/2019 0945   KETONESUR 80 (A) 03/01/2019 0945   PROTEINUR NEGATIVE 03/01/2019 0945   NITRITE NEGATIVE 03/01/2019 0945   LEUKOCYTESUR LARGE (A) 03/01/2019 0945   Sepsis Labs: '@[email or web address]' (procalcitonin:4,lacticidven:4) ) Recent Results (from the past 240 hour(s))  Blood culture (routine x 2)     Status: None   Collection Time: 02/28/19  8:45 PM   Specimen: BLOOD  Result Value Ref Range Status   Specimen Description BLOOD RIGHT ANTECUBITAL  Final   Special Requests   Final    BOTTLES DRAWN AEROBIC AND ANAEROBIC Blood Culture results may not be optimal due to an inadequate volume of blood received in culture bottles   Culture   Final    NO GROWTH 5 DAYS Performed at [facility], [place] [address], [place], [place] [address]    Report Status 03/05/2019 FINAL  Final  Blood culture (routine x 2)     Status: None   Collection Time: 02/28/19  8:50 PM   Specimen: BLOOD LEFT HAND  Result Value Ref Range Status   Specimen Description BLOOD LEFT HAND  Final   Special Requests   Final    BOTTLES DRAWN AEROBIC ONLY Blood Culture results may not be optimal due to an inadequate volume of blood received in culture bottles   Culture   Final    NO GROWTH 5 DAYS Performed at [facility], [place] [address], [place], [place] [address]    Report Status 03/05/2019 FINAL  Final  Urine culture     Status: Abnormal   Collection Time: 02/28/19 10:22 PM   Specimen: Urine, Random  Result Value Ref Range Status   Specimen Description URINE, RANDOM  Final   Special Requests NONE  Final   Culture (A)  Final    <10,000  COLONIES/mL INSIGNIFICANT GROWTH Performed at [facility], [place] [address], [place],  [address]    Report Status 03/02/2019 FINAL  Final  SARS CORONAVIRUS 2 (Cheyla Duchemin 6-24 HRS) Nasopharyngeal Nasopharyngeal Swab     Status: None   Collection Time: 02/28/19 10:44 PM   Specimen: Nasopharyngeal Swab  Result Value Ref Range Status   SARS Coronavirus 2 NEGATIVE NEGATIVE Final    Comment: (NOTE) SARS-CoV-2 target nucleic acids are NOT DETECTED. The SARS-CoV-2 RNA is generally detectable in upper and lower respiratory specimens during the acute phase of infection. Negative results do not preclude SARS-CoV-2  infection, do not rule out co-infections with other pathogens, and should not be used as the sole basis for treatment or other patient management decisions. Negative results must be combined with clinical observations, patient history, and epidemiological information. The expected result is Negative. Fact Sheet for Patients: SugarRoll.be Fact Sheet for Healthcare Providers: https://www.woods-mathews.com/ This test is not yet approved or cleared by the [place] FDA and  has been authorized for detection and/or diagnosis of SARS-CoV-2 by FDA under an Emergency Use Authorization (EUA). This EUA will remain  in effect (meaning this test can be used) for the duration of the COVID-19 declaration under Section 56 4(b)(1) of the Act, 21 U.S.C. section 360bbb-3(b)(1), unless the authorization is terminated or revoked sooner. Performed at [facility], [place] [address] [place], [place] [address]   Aerobic/Anaerobic Culture (surgical/deep wound)     Status: None   Collection Time: 03/04/19  1:30 PM   Specimen: Wound; Tissue  Result Value Ref Range Status   Specimen Description TISSUE WOUND LEFT PELVIS  Final   Special Requests NONE  Final   Gram Stain   Final    RARE WBC PRESENT,BOTH PMN AND MONONUCLEAR FEW GRAM POSITIVE COCCI RARE  GRAM VARIABLE ROD Performed at [facility], [place] [address], [place], [place] [address]    Culture   Final    FEW BACTEROIDES DISTASONIS BETA LACTAMASE POSITIVE FEW EUBACTERIUM LIMOSUM    Report Status 03/09/2019 FINAL  Final  Aerobic/Anaerobic Culture (surgical/deep wound)     Status: None   Collection Time: 03/04/19  1:40 PM   Specimen: Wound  Result Value Ref Range Status   Specimen Description WOUND LEFT PELVIS  Final   Special Requests SWAB SPEC B  Final   Gram Stain NO WBC SEEN RARE GRAM VARIABLE ROD   Final   Culture   Final    MIXED ANAEROBIC FLORA PRESENT.  CALL LAB IF FURTHER IID REQUIRED. NO GROWTH AEROBICALLY Performed at [place] [address], [place], [place] [address]    Report Status 03/08/2019 FINAL  Final     Scheduled Meds:  Chlorhexidine Gluconate Cloth  6 each Topical Q0600   citalopram  20 mg Oral Daily   enoxaparin (LOVENOX) injection  40 mg Subcutaneous Q24H   feeding supplement (ENSURE ENLIVE)  237 mL Oral BID BM   feeding supplement (PRO-STAT SUGAR FREE 64)  30 mL Oral TID   fentaNYL  1 patch Transdermal Q48H   gabapentin  300 mg Oral TID   influenza vac split quadrivalent PF  0.5 mL Intramuscular Tomorrow-1000   loratadine  10 mg Oral Daily   Melatonin  3 mg Oral QHS   metroNIDAZOLE  500 mg Oral Q8H   multivitamin with minerals  1 tablet Oral Daily   pantoprazole  40 mg Oral Daily   polyethylene glycol  17 g Oral Daily   potassium chloride  40 mEq Oral BID   QUEtiapine  50 mg Oral QHS   senna-docusate  2 tablet Oral QHS   sodium chloride flush  10-40 mL Intracatheter Q12H   Continuous Infusions:  sodium chloride 10 mL/hr at 03/09/19 1332   ceFEPime (MAXIPIME) IV 2 g (03/10/19 0529)    Procedures/Studies: Ct Abdomen Pelvis Wo Contrast  Result Date: 03/07/2019 CLINICAL DATA:  53 year old male with abdominal pain Recent explant pelvic hardware EXAM: CT ABDOMEN AND PELVIS WITHOUT CONTRAST TECHNIQUE:  Multidetector CT imaging of the abdomen and pelvis was performed following the standard protocol without  IV contrast. COMPARISON:  CT February 28, 2019 FINDINGS: Lower chest: Atelectasis at the lung bases. Nodularity at the base of the left lung, sub pulmonic, on the diaphragm, may reflect noncalcified plaque. No acute finding of the lung base. Hepatobiliary: Unremarkable liver.  Unremarkable gallbladder. Pancreas: Unremarkable Spleen: Unremarkable Adrenals/Urinary Tract: Unremarkable adrenal glands. Right hydronephrosis and dilation of the length of the ureter. There is a rounded stone at the right ureterovesical junction on image 76, which appears separate from the stone disease that was present previously. Chronic stones at the right bladder base in a linear configuration. In addition there are a cluster of small stones at the posterior lateral right collecting system as well as the more superior collecting system. No evidence of left-sided hydronephrosis. Small stone in the collecting system is unchanged. No evidence of dilated left ureter. Suprapubic catheter with balloon retention terminates in the urinary bladder which is relatively decompressed. Stomach/Bowel: Unremarkable stomach. Unremarkable colon which is decompressed. Unremarkable ostomy on the left abdominal wall, with Hartmann's pouch unremarkable. Normal appendix. There are few loops of bowel in the mid abdomen with air-fluid levels, borderline dilated, not well evaluated on the current CT. No discrete transition identified. Vascular/Lymphatic: Atherosclerotic changes again noted within the lower abdominal aorta and the iliac arteries. Changes of prior embolization within the right pelvis with coil mass present. Reproductive: Calcifications in the prostate. Other: No ventral wall hernia. Musculoskeletal: Posttraumatic and postoperative changes of the pelvis. The previous surgical hardware of the left sacroiliac fixation has been removed, now with a  soft tissue defect overlying the left sacrum and sacroiliac joint extending to the bone with partial debridement/bony excision. Redemonstration of partial healing/remodeling of the bilateral pubic rami fractures, sacral fracture, left iliac fracture. Heterotopic ossification within left gluteal musculature. The hardware of the left pubic ramus and of the proximal left femur remain in position. Postoperative changes on the deep aspect of the surgical site not well evaluated, with no evidence of large new fluid collection to indicate hematoma or new abscess. IMPRESSION: New right-sided hydronephrosis with obstructing stone at the right ureterovesical junction. Additional bilateral nonobstructing nephrolithiasis, similar to the comparison. Borderline dilated small bowel loops in the mid abdomen, presumably postoperative ileus given the recent surgery. No discrete transition identified. Interval surgical changes of left iliac debridement and explant of surgical hardware involving the sacroiliac fixation, not well evaluated given the absence of contrast. No complicating features identified. 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 Additional ancillary findings as above. Electronically Signed   By: Corrie Mckusick D.O.   On: 03/07/2019 19:13   Ct Pelvis W Contrast  Result Date: 02/28/2019 CLINICAL DATA:  Retroperitoneal abscess EXAM: CT PELVIS WITH CONTRAST TECHNIQUE: Multidetector CT imaging of the pelvis was performed using the standard protocol following the bolus administration of intravenous contrast. CONTRAST:  198m OMNIPAQUE IOHEXOL 300 MG/ML  SOLN COMPARISON:  Radiograph 12/03/2018, 09/14/2018, CT 07/13/2018 FINDINGS: Urinary Tract: Small stone in the mid to lower pole of the right kidney. Incompletely visualized mild hydronephrosis  right kidney. There is right hydroureter with mild urothelial enhancement. Suprapubic catheter in the bladder which is empty but thick-walled. Multiple coarse calcifications within the right posterior bladder measuring up to 1 cm in size. These are near the right UVJ. Distal left ureter is nonenlarged. Bowel: Left lower quadrant colostomy. No bowel wall thickening in the pelvis. Vascular/Lymphatic: Moderate aortic  atherosclerosis. No aneurysm. Vascular coils in the region of right internal iliac vessels. No significantly enlarged lymph nodes. Reproductive:  Coarse prostate calcifications.  No masses Other: Prominent presacral soft tissue thickening, presumably due to residual surgical and post traumatic changes. Musculoskeletal: Numerous pelvic fractures are redemonstrated including comminuted right superior/pubic root and inferior pubic rami fractures, with interval development of callus. Comminuted left superior and inferior pubic rami fractures, also with interval callus formation. Residual mild widening of SI joints with single long screw fixation across both SI joints, this tip projects into the soft tissues lateral to the right iliac bone, and single fixating screw across the left SI joint. Suspicion of mild hardware loosening at the screw heads on the left side of the SI joint screws with separation of head of screw from ring fastener since prior CT. Displaced left posterior sacral fracture without significant bony bridging. Interval increase in fragmented appearance of left iliac bone and fracture fragments with heterogeneous lucency in dominant fracture fragment abutting the SI joint. Some sclerosis at the margins. Heterotopic bone formation in the left posterior hip soft tissues and gluteal region. Single fixating screw across the left superior pubic ramus fracture with tip terminating near the, some osteolytic change about the tip of the screw. Generalized osteopenia in the pelvis. Threaded screw fixation  of left femoral neck fracture with some sclerosis at fracture margin but residual visibility of fracture lucency. Slight sclerosis at the left femoral head but no femoral head collapse. Residual soft tissue thickening involving the left psoas and iliacus muscles. Heterogeneous attenuation of the left gluteal and hip musculature with atrophic changes. Fluid and edema within the subcutaneous soft tissues of the left buttock and left lateral hip though without rim enhancing collection to suggest an abscess. Diffuse loss of subcutaneous fatty tissues at the proximal thigh anteriorly. Probable surface wound overlying the left posterior ilium. IMPRESSION: 1. Extensive bilateral left greater than right pelvic fractures with surgical hardware fixating the SI joints, proximal left femur, and left acetabulum and superior pubic ramus. Interval increased fragmentation of comminuted left iliac bone fracture with heterogeneous attenuation of dominant fracture fragment abutting the left SI joint, possibly reflecting chronic osteomyelitis. Suspicion of some hardware loosening of the SI joint fixating screws, as a gap is now seen between the screw heads and the proximal ring shaped fastener. Mild osteolytic changes about the tip of the left acetabular fixating screw at the pubic symphysis. 2. Heterogeneous soft tissue thickening, edema and probable fluid involving the left gluteal and posterior and lateral hip soft tissues, but without rim enhancing organized soft tissue abscess. Interval atrophy of left gluteal and hip musculature. Loss of subcutaneous fatty tissues with probable wound overlying the left posterior sacrum and iliac bone. 3. Suprapubic catheter with thickened bladder wall. Interim finding of mild right hydronephrosis and hydroureter, there are multiple stones within the right posterior bladder near the UVJ. Urothelial enhancement on the right could reflect ascending urinary tract infection. Right kidney stone  Electronically Signed   By: Donavan Foil M.D.   On: 02/28/2019 19:52   Dg Pelvis Comp Min 3v  Result Date: 03/04/2019 CLINICAL DATA:  Pelvic fracture EXAM: JUDET PELVIS - 3+ VIEW COMPARISON:  CT 12/03/2018.  Hip series 12/03/2018 FINDINGS: Catheter is noted over the pelvis. Surgical coils noted over the right pelvis. Extensive postsurgical changes left hip and pelvis with surgical screws in the left femoral neck, the left acetabulum/pubis, and across the left SI join. Again deformity noted of the left iliac wing  and a process such as chronic osteomyelitis cannot be excluded. Again loosening about the left SI joint screw heads cannot be excluded. Fractures are again noted of the pubic rami bilaterally. T IMPRESSION: 1. Extensive postsurgical changes left hip and pelvis again noted. Again loosening of the left SI joint surgical screw heads cannot be excluded. Fractures are again noted of the pubic rami bilaterally. 2. Again deformity noted of the left iliac wing. Again chronic osteomyelitis cannot be excluded. Electronically Signed   By: Marcello Moores  Register   On: 03/04/2019 09:32   Dg Chest Portable 1 View  Result Date: 02/28/2019 CLINICAL DATA:  Fevers and soft tissue wound near the left hip EXAM: PORTABLE CHEST 1 VIEW COMPARISON:  11/19/2018 FINDINGS: Cardiac shadow is within normal limits. The lungs are well aerated bilaterally. No focal infiltrate or sizable effusion is seen. No soft tissue abnormality is noted. No bony abnormality is seen. IMPRESSION: No acute abnormality noted. Electronically Signed   By: Inez Catalina M.D.   On: 02/28/2019 20:50   Dg C-arm 1-60 Min-no Report  Result Date: 03/04/2019 Fluoroscopy was utilized by the requesting physician.  No radiographic interpretation.   [place] Ekg Site Rite  Result Date: 03/07/2019 If Site Rite image not attached, placement could not be confirmed due to current cardiac rhythm.   Orson Eva, DO  [facility] Pager [phone]  If 7PM-7AM,  please contact night-coverage [email or web address] Password [facility] 03/10/2019, 9:39 AM   LOS: 10 days

## 2019-03-10 NOTE — Progress Notes (Addendum)
Subjective: fatigued   Antibiotics:  Anti-infectives (From admission, onward)   Start     Dose/Rate Route Frequency Ordered Stop   03/08/19 1400  metroNIDAZOLE (FLAGYL) tablet 500 mg     500 mg Oral Every 8 hours 03/08/19 1339     03/06/19 1200  vancomycin (VANCOCIN) 1,750 mg in sodium chloride 0.9 % 500 mL IVPB  Status:  Discontinued     1,750 mg 250 mL/hr over 120 Minutes Intravenous Every 24 hours 03/06/19 1103 03/07/19 1407   03/04/19 1401  tobramycin (NEBCIN) powder  Status:  Discontinued       As needed 03/04/19 1401 03/04/19 1441   03/04/19 1359  vancomycin (VANCOCIN) powder  Status:  Discontinued       As needed 03/04/19 1400 03/04/19 1441   03/04/19 0600  ceFAZolin (ANCEF) IVPB 2g/100 mL premix     2 g 200 mL/hr over 30 Minutes Intravenous On call to O.R. 03/04/19 0000 03/04/19 1305   03/02/19 0600  vancomycin (VANCOCIN) 1,500 mg in sodium chloride 0.9 % 500 mL IVPB  Status:  Discontinued     1,500 mg 250 mL/hr over 120 Minutes Intravenous Every 24 hours 03/01/19 1658 03/06/19 1103   03/01/19 1000  vancomycin (VANCOCIN) 1,250 mg in sodium chloride 0.9 % 250 mL IVPB  Status:  Discontinued     1,250 mg 166.7 mL/hr over 90 Minutes Intravenous Every 12 hours 02/28/19 2119 03/01/19 1658   03/01/19 0500  ceFEPIme (MAXIPIME) 2 g in sodium chloride 0.9 % 100 mL IVPB     2 g 200 mL/hr over 30 Minutes Intravenous Every 8 hours 02/28/19 2119     02/28/19 2130  vancomycin (VANCOCIN) 1,500 mg in sodium chloride 0.9 % 500 mL IVPB     1,500 mg 250 mL/hr over 120 Minutes Intravenous  Once 02/28/19 2116 03/01/19 0011   02/28/19 2100  ceFEPIme (MAXIPIME) 2 g in sodium chloride 0.9 % 100 mL IVPB     2 g 200 mL/hr over 30 Minutes Intravenous  Once 02/28/19 2057 02/28/19 2147   02/28/19 2100  vancomycin (VANCOCIN) IVPB 1000 mg/200 mL premix  Status:  Discontinued     1,000 mg 200 mL/hr over 60 Minutes Intravenous  Once 02/28/19 2057 02/28/19 2116      Medications: Scheduled  Meds: . Chlorhexidine Gluconate Cloth  6 each Topical Q0600  . citalopram  20 mg Oral Daily  . enoxaparin (LOVENOX) injection  40 mg Subcutaneous Q24H  . feeding supplement (ENSURE ENLIVE)  237 mL Oral BID BM  . feeding supplement (PRO-STAT SUGAR FREE 64)  30 mL Oral TID  . fentaNYL  1 patch Transdermal Q48H  . gabapentin  300 mg Oral TID  . influenza vac split quadrivalent PF  0.5 mL Intramuscular Tomorrow-1000  . loratadine  10 mg Oral Daily  . Melatonin  3 mg Oral QHS  . metroNIDAZOLE  500 mg Oral Q8H  . multivitamin with minerals  1 tablet Oral Daily  . pantoprazole  40 mg Oral Daily  . polyethylene glycol  17 g Oral Daily  . potassium chloride  40 mEq Oral TID  . QUEtiapine  50 mg Oral QHS  . senna-docusate  2 tablet Oral QHS  . sodium chloride flush  10-40 mL Intracatheter Q12H   Continuous Infusions: . sodium chloride 10 mL/hr at 03/09/19 1332  . ceFEPime (MAXIPIME) IV 2 g (03/10/19 0529)   PRN Meds:.acetaminophen **OR** acetaminophen, clonazePAM, guaiFENesin, HYDROmorphone (DILAUDID) injection, methocarbamol, naLOXone (  NARCAN)  injection, ondansetron **OR** ondansetron (ZOFRAN) IV, oxyCODONE-acetaminophen, promethazine, sodium chloride flush    Objective: Weight change:   Intake/Output Summary (Last 24 hours) at 03/10/2019 1214 Last data filed at 03/10/2019 0900 Gross per 24 hour  Intake 1080 ml  Output 1100 ml  Net -20 ml   Blood pressure (!) 117/93, pulse 70, temperature (!) 97.5 F (36.4 C), temperature source Oral, resp. rate 18, height 6\' 3"  (1.905 m), weight 60.8 kg, SpO2 98 %. Temp:  [97.5 F (36.4 C)-98.8 F (37.1 C)] 97.5 F (36.4 C) (09/27 0809) Pulse Rate:  [70-85] 70 (09/27 0809) Resp:  [18] 18 (09/27 0809) BP: (117-144)/(69-97) 117/93 (09/27 0809) SpO2:  [98 %] 98 % (09/27 0809)  Physical Exam: General: Alert and awake, oriented x3, with rag on forehead HEENT: anicteric sclera, EOMI CVS regular rate, normal  Chest: , no wheezing, no respiratory  distress Abdomen: soft non-distended,  Extremities: no edema or deformity noted bilaterally Skin: no rashes Neuro: nonfocal  CBC:    BMET Recent Labs    03/09/19 0349 03/10/19 0343  NA 142 143  K 2.9* 2.8*  CL 110 110  CO2 23 25  GLUCOSE 85 86  BUN 13 12  CREATININE 0.39* 0.46*  CALCIUM 9.0 9.2     Liver Panel  No results for input(s): PROT, ALBUMIN, AST, ALT, ALKPHOS, BILITOT, BILIDIR, IBILI in the last 72 hours.     Sedimentation Rate No results for input(s): ESRSEDRATE in the last 72 hours. C-Reactive Protein No results for input(s): CRP in the last 72 hours.  Micro Results: Recent Results (from the past 720 hour(s))  Blood culture (routine x 2)     Status: None   Collection Time: 02/28/19  8:45 PM   Specimen: BLOOD  Result Value Ref Range Status   Specimen Description BLOOD RIGHT ANTECUBITAL  Final   Special Requests   Final    BOTTLES DRAWN AEROBIC AND ANAEROBIC Blood Culture results may not be optimal due to an inadequate volume of blood received in culture bottles   Culture   Final    NO GROWTH 5 DAYS Performed at [facility], [address], [place], [place]    Report Status 03/05/2019 FINAL  Final  Blood culture (routine x 2)     Status: None   Collection Time: 02/28/19  8:50 PM   Specimen: BLOOD LEFT HAND  Result Value Ref Range Status   Specimen Description BLOOD LEFT HAND  Final   Special Requests   Final    BOTTLES DRAWN AEROBIC ONLY Blood Culture results may not be optimal due to an inadequate volume of blood received in culture bottles   Culture   Final    NO GROWTH 5 DAYS Performed at [facility], [address], [place], [place]    Report Status 03/05/2019 FINAL  Final  Urine culture     Status: Abnormal   Collection Time: 02/28/19 10:22 PM   Specimen: Urine, Random  Result Value Ref Range Status   Specimen Description URINE, RANDOM  Final   Special Requests NONE  Final   Culture (A)  Final     <10,000 COLONIES/mL INSIGNIFICANT GROWTH Performed at [facility], [address], [place], [place]    Report Status 03/02/2019 FINAL  Final  SARS CORONAVIRUS 2 (TAT 6-24 HRS) Nasopharyngeal Nasopharyngeal Swab     Status: None   Collection Time: 02/28/19 10:44 PM   Specimen: Nasopharyngeal Swab  Result Value Ref Range Status   SARS Coronavirus 2 NEGATIVE NEGATIVE Final    Comment: (NOTE) SARS-CoV-2 target nucleic acids are NOT DETECTED. The SARS-CoV-2 RNA is generally detectable in upper and lower respiratory specimens during the acute phase of infection. Negative results do not preclude SARS-CoV-2 infection, do not rule out co-infections with other pathogens, and should not be used as the sole basis for treatment or other patient management decisions. Negative results must be combined with clinical observations, patient history, and epidemiological information. The expected result is Negative. Fact Sheet for Patients: SugarRoll.be Fact Sheet for Healthcare Providers: https://www.woods-mathews.com/ This test is not yet approved or cleared by the [place] FDA and  has been authorized for detection and/or diagnosis of SARS-CoV-2 by FDA under an Emergency Use Authorization (EUA). This EUA will remain  in effect (meaning this test can be used) for the duration of the COVID-19 declaration under Section 56 4(b)(1) of the Act, 21 U.S.C. section 360bbb-3(b)(1), unless the authorization is terminated or revoked sooner. Performed at [facility], [place] [address], [place], [place] [address]   Aerobic/Anaerobic Culture (surgical/deep wound)     Status: None   Collection Time: 03/04/19  1:30 PM   Specimen: Wound; Tissue  Result Value Ref Range Status   Specimen Description TISSUE WOUND LEFT PELVIS  Final   Special Requests NONE  Final   Gram Stain   Final    RARE WBC PRESENT,BOTH PMN AND MONONUCLEAR FEW GRAM POSITIVE  COCCI RARE GRAM VARIABLE ROD Performed at [facility], [place] [address] [place], [place] [address]    Culture   Final    FEW BACTEROIDES DISTASONIS BETA LACTAMASE POSITIVE FEW EUBACTERIUM LIMOSUM    Report Status 03/09/2019 FINAL  Final  Aerobic/Anaerobic Culture (surgical/deep wound)     Status: None   Collection Time: 03/04/19  1:40 PM   Specimen: Wound  Result Value Ref Range Status   Specimen Description WOUND LEFT PELVIS  Final   Special Requests SWAB SPEC B  Final   Gram Stain NO WBC SEEN RARE GRAM VARIABLE ROD   Final   Culture   Final    MIXED ANAEROBIC FLORA PRESENT.  CALL LAB IF FURTHER IID REQUIRED. NO GROWTH AEROBICALLY Performed at [facility] [address], [place], [place] [address]    Report Status 03/08/2019 FINAL  Final    Studies/Results: No results found.    Assessment/Plan:  INTERVAL HISTORY: Cultures did yield at least 3 anaerobes 1 of which was a beta-lactamase producing organism   Principal Problem:   Wound infection, posttraumatic Active Problems:   Anxiety and depression   Wound of buttock   Colostomy status (HCC)   Chronic pain due to trauma   Hydronephrosis, right   GERD (gastroesophageal reflux disease)   Suprapubic catheter (HCC)   Protein-calorie malnutrition, moderate (HCC)   Chronic osteomyelitis of pelvis, left (HCC)    Christopher [name] is a 53 y.o. male with  Osteomyelitis of pelvis, culture negative - s/p excision of infected bone. He grew Anerobes on culture this time one of which was beta lactamase producing  He is currently on cefepiome and flagyl  And should complete a 6-week course.  I will schedule him with Dr. Linus Salmons in 4 weeks time.  If he has trouble tolerating the metronidazole 1 could change him to meropenem   Christopher [name] has an appointment on 04/04/2019 at 67 Am with Dr. Linus Salmons  He should arrive at least 15  minutes prior to his appointment.  [facility] is located in the [facility] at  [address] [place] in [place].  [address], which is located to the left of the elevators.  Phone: [phone]  Fax: [phone]  [email or web address]    We will sign off for now please call with further questions.    LOS: 10 days   Acey Lav 03/10/2019, 12:14 PM

## 2019-03-11 DIAGNOSIS — G894 Chronic pain syndrome: Secondary | ICD-10-CM

## 2019-03-11 DIAGNOSIS — Z466 Encounter for fitting and adjustment of urinary device: Secondary | ICD-10-CM

## 2019-03-11 DIAGNOSIS — S31000D Unspecified open wound of lower back and pelvis without penetration into retroperitoneum, subsequent encounter: Secondary | ICD-10-CM

## 2019-03-11 DIAGNOSIS — Z945 Skin transplant status: Secondary | ICD-10-CM

## 2019-03-11 DIAGNOSIS — Z993 Dependence on wheelchair: Secondary | ICD-10-CM

## 2019-03-11 DIAGNOSIS — D638 Anemia in other chronic diseases classified elsewhere: Secondary | ICD-10-CM

## 2019-03-11 DIAGNOSIS — S32811D Multiple fractures of pelvis with unstable disruption of pelvic ring, subsequent encounter for fracture with routine healing: Secondary | ICD-10-CM

## 2019-03-11 DIAGNOSIS — S31819D Unspecified open wound of right buttock, subsequent encounter: Secondary | ICD-10-CM

## 2019-03-11 DIAGNOSIS — Z433 Encounter for attention to colostomy: Secondary | ICD-10-CM

## 2019-03-11 DIAGNOSIS — F419 Anxiety disorder, unspecified: Secondary | ICD-10-CM

## 2019-03-11 DIAGNOSIS — J9611 Chronic respiratory failure with hypoxia: Secondary | ICD-10-CM

## 2019-03-11 DIAGNOSIS — Z87891 Personal history of nicotine dependence: Secondary | ICD-10-CM

## 2019-03-11 DIAGNOSIS — Z435 Encounter for attention to cystostomy: Secondary | ICD-10-CM

## 2019-03-11 DIAGNOSIS — R1310 Dysphagia, unspecified: Secondary | ICD-10-CM

## 2019-03-11 DIAGNOSIS — S381XXD Crushing injury of abdomen, lower back, and pelvis, subsequent encounter: Secondary | ICD-10-CM

## 2019-03-11 DIAGNOSIS — S3802XD Crushing injury of scrotum and testis, subsequent encounter: Secondary | ICD-10-CM | POA: Diagnosis not present

## 2019-03-11 LAB — BASIC METABOLIC PANEL
Anion gap: 6 (ref 5–15)
BUN: 13 mg/dL (ref 6–20)
CO2: 25 mmol/L (ref 22–32)
Calcium: 9.3 mg/dL (ref 8.9–10.3)
Chloride: 111 mmol/L (ref 98–111)
Creatinine, Ser: 0.51 mg/dL — ABNORMAL LOW (ref 0.61–1.24)
GFR calc Af Amer: >60 mL/min (ref >60)
GFR calc non Af Amer: >60 mL/min (ref >60)
Glucose, Bld: 83 mg/dL (ref 70–99)
Potassium: 3.7 mmol/L (ref 3.5–5.1)
Sodium: 142 mmol/L (ref 135–145)

## 2019-03-11 LAB — CBC
HCT: 31.4 % — ABNORMAL LOW (ref 39.0–52.0)
Hemoglobin: 9.8 g/dL — ABNORMAL LOW (ref 13.0–17.0)
MCH: 32.2 pg (ref 26.0–34.0)
MCHC: 31.2 g/dL (ref 30.0–36.0)
MCV: 103.3 fL — ABNORMAL HIGH (ref 80.0–100.0)
Platelets: 312 10*3/uL (ref 150–400)
RBC: 3.04 MIL/uL — ABNORMAL LOW (ref 4.22–5.81)
RDW: 13.5 % (ref 11.5–15.5)
WBC: 5.2 10*3/uL (ref 4.0–10.5)
nRBC: 0 % (ref 0.0–0.2)

## 2019-03-11 LAB — MAGNESIUM: Magnesium: 1.9 mg/dL (ref 1.7–2.4)

## 2019-03-11 LAB — C-REACTIVE PROTEIN: CRP: 3.4 mg/dL — ABNORMAL HIGH (ref <1.0)

## 2019-03-11 LAB — SEDIMENTATION RATE: Sed Rate: 74 mm/hr — ABNORMAL HIGH (ref 0–16)

## 2019-03-11 NOTE — TOC Progression Note (Signed)
Transition of Care [facility]) - Progression Note    Patient Details  Name: Christopher [name] MRN: [id number] Date of Birth: [birth year]  Transition [facility] Care [facility] CM/SW Contact  Rae Mar, RN Phone Number: 03/11/2019, 3:02 PM  Clinical Narrative:     Resumption HH orders have been placed and updated Mateo Flow with [facility] who is aware of pt's planned transition home for tomorrow 9/29. [name] with [facility] is following and working with Jenny Reichmann, the [employer], for approval with planned transition home tomorrow 9/29. At this time there are no further TOC needs noted.  Expected Discharge Plan: [place] Barriers to Discharge: Continued Medical Work up  Expected Discharge Plan and Services Expected Discharge Plan: [place] In-house Referral: Clinical Social Work Discharge Planning Services: CM Consult Post Acute Care Choice: [place], Resumption of Svcs/PTA Provider                   DME Arranged: N/A DME Agency: NA       HH Arranged: PT, OT, RN, Nurse's Aide, IV Antibiotics, Social Work [employer] Agency: Microbiologist ([facility]) Date [place]: 03/11/19 Time HH Agency Contacted: 1500 Representative spoke with at [place]: [place] ([place]) Interventions    Readmission Risk Interventions Readmission Risk Prevention Plan 10/16/2018  Transportation Screening Complete  Medication Review Press photographer) Complete  PCP or Specialist appointment within 3-5 days of discharge Not Complete  PCP/Specialist Appt Not Complete comments Pt discharging to [place] or [place] Not Complete  HRI or Home Care Consult Pt Refusal Comments Pt discharging to [place] Not Complete  SW Consult Not Complete Comments Pt discharging to [place] Not Applicable  Some recent data might be  hidden

## 2019-03-11 NOTE — Progress Notes (Signed)
Physical Therapy Treatment Patient Details Name: Christopher [name] MRN: [id number] DOB: Mar 30, 1966 Today's Date: 03/11/2019    History of Present Illness Pt is a 53 year old man injured at work in January 2020 resulting in complex pelvic ring fx and degloving of pelvis, underwent fixation 07/12/18.  Pt has been followed by wound care and plastic surgery for chronic osteomyelitis.  Pt admitted 02/28/19 with worsening pain and drainage from wound, underwent removal of hardware, draining of posterior pelvis wound and removal of superficial bone. PMH: chronic pain, colostomy, suprapubic catheter, depression, anxiety.     PT Comments    Pt performed gt training and functional mobility during session.  These tasks were limited to standing edge of bed and small series of sidestepping to [facility].  Pt continues to benefit from skilled therapy as he continues to require min to moderate assistance +2.  He became dizzy during functional mobility.    BPs Supine-122/80 Sitting-132/91 Standing (but unable to remain standing to complete pressure reading due to dizziness)- 111/72    Follow Up Recommendations  Supervision/Assistance - 24 hour;Other (comment)     Equipment Recommendations  None recommended by PT    Recommendations for Other Services       Precautions / Restrictions Precautions Precautions: Fall Precaution Comments: watch BP Restrictions Weight Bearing Restrictions: No LLE Weight Bearing: Weight bearing as tolerated    Mobility  Bed Mobility Overal bed mobility: Needs Assistance Bed Mobility: Supine to Sit;Sit to Supine     Supine to sit: Min assist;+2 for physical assistance Sit to supine: Mod assist;+2 for physical assistance   General bed mobility comments: Pt required min assistance to come to edge of bed and moderate assistance to return to supine.  Assistance to lift LEs back to bed and boost into supine. Pt required assistance for trunk elevation to come to sitting and  presents with posterior bias in sitting.  Transfers Overall transfer level: Needs assistance Equipment used: Rolling walker (2 wheeled) Transfers: Sit to/from Stand Sit to Stand: Min assist;+2 physical assistance         General transfer comment: To stabilize RW and he required pulling into standing due to poor ability to push through LEs due to pain.  Ambulation/Gait Ambulation/Gait assistance: Min assist;+2 safety/equipment Gait Distance (Feet): 4 Feet Assistive device: Rolling walker (2 wheeled) Gait Pattern/deviations: Antalgic     General Gait Details: Series of sidestepping to move to [facility].  Pt performed with min assistance to progress RW to the side.   Stairs             Wheelchair Mobility    Modified Rankin (Stroke Patients Only)       Balance Overall balance assessment: Needs assistance   Sitting balance-Leahy Scale: Poor       Standing balance-Leahy Scale: Poor                              Cognition Arousal/Alertness: Awake/alert Behavior During Therapy: WFL for tasks assessed/performed Overall Cognitive Status: Within Functional Limits for tasks assessed                                        Exercises      General Comments        Pertinent Vitals/Pain Pain Assessment: Faces Faces Pain Scale: Hurts whole lot Pain Location: L hip/buttocks Pain Descriptors /  Indicators: Aching;Moaning Pain Intervention(s): Monitored during session;Repositioned;RN gave pain meds during session;Patient requesting pain meds-RN notified    Home Living                      Prior Function            PT Goals (current goals can now be found in the care plan section) Acute Rehab PT Goals Patient Stated Goal: to go home with pain managed Potential to Achieve Goals: Good Progress towards PT goals: Progressing toward goals    Frequency    Min 5X/week      PT Plan Current plan remains appropriate     Co-evaluation PT/OT/SLP Co-Evaluation/Treatment: Yes Reason for Co-Treatment: Complexity of the patient's impairments (multi-system involvement);Necessary to address cognition/behavior during functional activity PT goals addressed during session: Mobility/safety with mobility OT goals addressed during session: ADL's and self-care      AM-PAC PT "6 Clicks" Mobility   Outcome Measure  Help needed turning from your back to your side while in a flat bed without using bedrails?: A Lot Help needed moving from lying on your back to sitting on the side of a flat bed without using bedrails?: A Lot Help needed moving to and from a bed to a chair (including a wheelchair)?: A Little Help needed standing up from a chair using your arms (e.g., wheelchair or bedside chair)?: A Little Help needed to walk in hospital room?: A Little Help needed climbing 3-5 steps with a railing? : A Little 6 Click Score: 16    End of Session Equipment Utilized During Treatment: Gait belt Activity Tolerance: Treatment limited secondary to medical complications (Comment) Patient left: in bed;with call bell/phone within reach Nurse Communication: Mobility status PT Visit Diagnosis: Difficulty in walking, not elsewhere classified (R26.2);Pain;Muscle weakness (generalized) (M62.81) Pain - Right/Left: Left Pain - part of body: Leg;Hip     Time: 6606-3016 PT Time Calculation (min) (ACUTE ONLY): 32 min  Charges:  $Therapeutic Activity: 8-22 mins                     Governor Rooks, PTA Acute Rehabilitation Services Pager [phone] Office [phone]     [name] 03/11/2019, 2:16 PM

## 2019-03-11 NOTE — Progress Notes (Signed)
Christopher [name] with [facility] called, message left for her. Her number is [place], RN 03/11/2019 1:07 PM

## 2019-03-11 NOTE — Progress Notes (Signed)
Occupational Therapy Treatment Patient Details Name: Christopher [name] MRN: [id number] DOB: Aug 14, 1965 Today's Date: 03/11/2019    History of present illness Pt is a 53 year old man injured at work in January 2020 resulting in complex pelvic ring fx and degloving of pelvis, underwent fixation 07/12/18.  Pt has been followed by wound care and plastic surgery for chronic osteomyelitis.  Pt admitted 02/28/19 with worsening pain and drainage from wound, underwent removal of hardware, draining of posterior pelvis wound and removal of superficial bone. PMH: chronic pain, colostomy, suprapubic catheter, depression, anxiety.    OT comments  Pt making steady progress towards OT goals this session. Session focus on functional transfers as precursor to higher level ADLs. Pt MIN A- MOD A +2 overall for bed mobility. Pt dizzy once sitting EOB ( see BPs below). Pt sat EOB ~ 3 minutes while RN administered pain meds and pt washed face with supervision/ set-up. Pt sit >stand with RW MIN A +2 to stabilize RW. Pt able to take side steps up to [facility] with MIN A +2. DC plan remains appropriate. Will continue to follow for acute OT needs.   Supine- 122/80 EOB- 132/91 Standing>sitting d/t dizziness- 111/72    Follow Up Recommendations  Home health OT    Equipment Recommendations       Recommendations for Other Services      Precautions / Restrictions Precautions Precautions: Fall Precaution Comments: watch BP Restrictions Weight Bearing Restrictions: No LLE Weight Bearing: Weight bearing as tolerated       Mobility Bed Mobility Overal bed mobility: Needs Assistance Bed Mobility: Supine to Sit;Sit to Supine     Supine to sit: Min assist;+2 for physical assistance Sit to supine: Mod assist;+2 for physical assistance   General bed mobility comments: Pt required min assistance to come to edge of bed and moderate assistance to return to supine.  Assistance to lift LEs back to bed and boost into supine.  Pt required assistance for trunk elevation to come to sitting and presents with posterior bias in sitting.  Transfers Overall transfer level: Needs assistance Equipment used: Rolling walker (2 wheeled) Transfers: Sit to/from Stand Sit to Stand: Min assist;+2 physical assistance         General transfer comment: To stabilize RW and he required pulling into standing due to poor ability to push through LEs due to pain.    Balance Overall balance assessment: Needs assistance Sitting-balance support: Bilateral upper extremity supported;Feet supported Sitting balance-Leahy Scale: Poor Sitting balance - Comments: Reliant on mod-max A to maintain sitting balance secondary to dizziness.      Standing balance-Leahy Scale: Poor                             ADL either performed or assessed with clinical judgement   ADL Overall ADL's : Needs assistance/impaired     Grooming: Wash/dry face;Sitting;Set up;Supervision/safety Grooming Details (indicate cue type and reason): supervision for sitting EOB d/t dizziness                   Toilet Transfer Details (indicate cue type and reason): unable to attempt d/t dizziness; pt able to take side steps up to [facility] with MIN A +2         Functional mobility during ADLs: Minimal assistance;+2 for physical assistance;Rolling walker General ADL Comments: pt supervision/ set- up for seated ADLs; unable to transfer this session; MIN A +2 sit>stand  Vision Baseline Vision/History: No visual deficits     Perception     Praxis      Cognition Arousal/Alertness: Awake/alert Behavior During Therapy: WFL for tasks assessed/performed Overall Cognitive Status: Within Functional Limits for tasks assessed                                          Exercises     Shoulder Instructions       General Comments pt does not like to see skin graft on LLE; tearful when looks at it    Pertinent Vitals/ Pain       Pain  Assessment: Faces Faces Pain Scale: Hurts whole lot Pain Location: L hip/buttocks Pain Descriptors / Indicators: Aching;Moaning Pain Intervention(s): Monitored during session;RN gave pain meds during session;Patient requesting pain meds-RN notified;Repositioned  Home Living                                          Prior Functioning/Environment              Frequency  Min 2X/week        Progress Toward Goals  OT Goals(current goals can now be found in the care plan section)  Progress towards OT goals: Progressing toward goals  Acute Rehab OT Goals Patient Stated Goal: to go home with pain managed OT Goal Formulation: With patient Time For Goal Achievement: 03/20/19 Potential to Achieve Goals: Good  Plan Discharge plan remains appropriate    Co-evaluation    PT/OT/SLP Co-Evaluation/Treatment: Yes Reason for Co-Treatment: Complexity of the patient's impairments (multi-system involvement);Necessary to address cognition/behavior during functional activity;To address functional/ADL transfers;For patient/therapist safety PT goals addressed during session: Mobility/safety with mobility OT goals addressed during session: ADL's and self-care      AM-PAC OT "6 Clicks" Daily Activity     Outcome Measure   Help from another person eating meals?: None Help from another person taking care of personal grooming?: None Help from another person toileting, which includes using toliet, bedpan, or urinal?: A Lot Help from another person bathing (including washing, rinsing, drying)?: A Lot Help from another person to put on and taking off regular upper body clothing?: A Little Help from another person to put on and taking off regular lower body clothing?: Total 6 Click Score: 16    End of Session Equipment Utilized During Treatment: Rolling walker  OT Visit Diagnosis: Pain;Muscle weakness (generalized) (M62.81)   Activity Tolerance Treatment limited secondary to  medical complications (Comment)(hypotension)   Patient Left in bed;with call bell/phone within reach   Nurse Communication          Time: 1320-1350 OT Time Calculation (min): 30 min  Charges: OT General Charges $OT Visit: 1 Visit OT Treatments $Self Care/Home Management : 8-22 mins  [place], [place] 03/11/2019, 3:22 PM

## 2019-03-11 NOTE — Progress Notes (Signed)
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 Pt states that hewas involved in averysignificant trauma in January and has had chronic woundin leftbuttock areasince that time.Pt isfollowing up with plastic surgery as outpatient for wound management.He notedincreased drainage in the past several days.He hadmild fever at home. He is body temperature is 97.9 in ED. Patient was started with doxycycline last Thursday without improvement. He continues to have worsening pain, which is constant, sharp, 9 out of 10 in severity, nonradiating. Patient does not have chest pain, shortness breath, cough, nausea, vomiting, diarrhea, abdominal pain, symptoms of UTI. In ED pt was found to have WBC 5.8, lactic acid 1.5, negative COVID-19 test, electrolytes renal function okay, temperature normal, blood pressure 121/79, heart rate 94, oxygen saturation 94-97% on room air. Chest x-ray negative. CT of pelvisshowed possible chronic osteomyelitis,and kidney stone andright mild hydronephrosis. Patient is admitted to [facility] bed for observation. Urology,Dr. Junious Silk was consulted by EDP--Sincepatient's renal function is normal, pt will beappropriate for outpatient follow-up with urology.  Status-post removal of hardware pelvis and excision of superficial bone pelvis for osteomyelitis by ortho, Dr. Doreatha Martin and Dr. Marla Roe on 03/04/19. Cultures remain negative.PICC line has been placed infectious disease team has recommended 6 weeks of antibiotics  Assessment/Plan:  Left posterior hip wound/chronic osteomyelitis -S/P removal of hardware  pelvis and excision of superficial bone pelvis for osteomyelitis 03/04/19 by ortho, Dr. Doreatha Martin and Dr. Marla Roe -Elevated ESR and CRP - typical given osteomyelitis  -Continue IV antibiotics empirically. D/Ced IV vancomycin on 9/24, Cont. Cefepime and metronidazolex 6 wks -Surgical wound Gram stain grew gram-positive cocci and rare gram variable rods = Bacteroides distasonis -surgical Wound culture---Bacteroides distasonis -History of positive wound cultures with Pseudomonas and E. coli in the past. -Infectious disease consulted: -PICC line placement 03/09/2019 -Dr. Comer;DC'd IV vancomycin, recommended to continue  Cefepime and metronidazole for total of 6 weeks -end date April 14, 2019. ID follow up  clinic on 04/04/2019 at 44 Am with Dr. Linus Salmons -Plastic surgery and orthopedic surgery following  -per plastic surgery: Daily KY jelly/surgilube to the green mesh. Apply KY to 4x4            gauze then place over green mesh (sorbact). Please call if        any questions in regards to wound dressing changes.      Duoderm placed around wound to protect skin from tape.  Per orthopedics: f/u in 2 weeks as outpatient   Hypokalemia, resolved  - GI loss vs poor PO intake  -replace as needed  Severe protein calorie malnutrition BMI of 16 with albumin of 2.6 Nutritionist to assist with nutritional needs Encourage oral protein calorie intake Continue oral supplement  Physical debility/ambulatory dysfunction PT OT following-patient is unable to participate much due to pain/fatigue/at times lethargic due to medication -he has little motivation to participate Fall precautions  Asymptomatic pyuria - UTI ruled out Presented with pyuria but with insignificant growth on urine culture Currently on IV cefepime  Chronic pain syndrome Not opiate nave --continue current pain regiment On high doses of opiates at home Continue home fentanyl patch 50 mcg/h every other day Continue  gabapentin 300 mg 3 times daily, Robaxin 500 mg every 6 hours as  needed for muscle spasm Continue Percocet 5-325 mg 1 tablet every 4 hours as needed for severe pain  Chronic anxiety/depression -Continue home medication Celexa and Klonopin prn -Patient seems to have periodic anxiety attacks, PRN Ativan will be considered -More stable now  GERD Continue home medication  Right hydronephrosis with nephrolithiasis Suprapubic catheter in place - chronic Continue to monitor urine output and renal function -renal function remains stable  GERD Continue Protonix  Status post colostomy/suprapubic catheter, Remained stable, functional positive stool and urine. Continue   Code Status: full; Family Communication: d/w patient, RN (indicate person spoken with, relationship, and if by phone, the number) Disposition Plan: home possible tomorrow if no iv pain med required. HHC. Iv atx    Consultants: Urology,Dr. Criss Alvine consulted, plastic surgery, orthopedic surgery. -Consulted infectious disease team Dr. Novella Olive 03/07/2019  Procedures:  Hardware removal   Antibiotics: Anti-infectives (From admission, onward)   Start     Dose/Rate Route Frequency Ordered Stop   03/08/19 1400  metroNIDAZOLE (FLAGYL) tablet 500 mg     500 mg Oral Every 8 hours 03/08/19 1339     03/06/19 1200  vancomycin (VANCOCIN) 1,750 mg in sodium chloride 0.9 % 500 mL IVPB  Status:  Discontinued     1,750 mg 250 mL/hr over 120 Minutes Intravenous Every 24 hours 03/06/19 1103 03/07/19 1407   03/04/19 1401  tobramycin (NEBCIN) powder  Status:  Discontinued       As needed 03/04/19 1401 03/04/19 1441   03/04/19 1359  vancomycin (VANCOCIN) powder  Status:  Discontinued       As needed 03/04/19 1400 03/04/19 1441   03/04/19 0600  ceFAZolin (ANCEF) IVPB 2g/100 mL premix     2 g 200 mL/hr over 30 Minutes Intravenous On call to O.R. 03/04/19 0000 03/04/19 1305   03/02/19 0600  vancomycin (VANCOCIN) 1,500 mg in  sodium chloride 0.9 % 500 mL IVPB  Status:  Discontinued     1,500 mg 250 mL/hr over 120 Minutes Intravenous Every 24 hours 03/01/19 1658 03/06/19 1103   03/01/19 1000  vancomycin (VANCOCIN) 1,250 mg in sodium chloride 0.9 % 250 mL IVPB  Status:  Discontinued     1,250 mg 166.7 mL/hr over 90 Minutes Intravenous Every 12 hours 02/28/19 2119 03/01/19 1658   03/01/19 0500  ceFEPIme (MAXIPIME) 2 g in sodium chloride 0.9 % 100 mL IVPB     2 g 200 mL/hr over 30 Minutes Intravenous Every 8 hours 02/28/19 2119     02/28/19 2130  vancomycin (VANCOCIN) 1,500 mg in sodium chloride 0.9 % 500 mL IVPB     1,500 mg 250 mL/hr over 120 Minutes Intravenous  Once 02/28/19 2116 03/01/19 0011   02/28/19 2100  ceFEPIme (MAXIPIME) 2 g in sodium chloride 0.9 % 100 mL IVPB     2 g 200 mL/hr over 30 Minutes Intravenous  Once 02/28/19 2057 02/28/19 2147   02/28/19 2100  vancomycin (VANCOCIN) IVPB 1000 mg/200 mL premix  Status:  Discontinued     1,000 mg 200 mL/hr over 60 Minutes Intravenous  Once 02/28/19 2057 02/28/19 2116        (indicate start date, and stop date if known)  HPI/Subjective: No distress. Reports feeling better. No acute SOB.   Objective: Vitals:   03/11/19 0603 03/11/19 0815  BP: 122/84 139/81  Pulse: 70 91  Resp: 20 16  Temp: 97.6 F (36.4 C) 97.8 F (36.6 C)  SpO2: 97% 99%    Intake/Output Summary (Last 24 hours) at 03/11/2019  [place] filed at 03/11/2019 0500 Gross per 24 hour  Intake 480 ml  Output 3250 ml  Net -2770 ml   Filed Weights   02/28/19 1303 03/01/19 0650  Weight: 70.3 kg 60.8 kg    Exam:   General:  No distress   Cardiovascular: s1,s2 rrr  Respiratory: CTA BL   Abdomen: soft, nt   Musculoskeletal: no leg edema    Data Reviewed: Basic Metabolic Panel: Recent Labs  Lab 03/07/19 0236 03/07/19 0922 03/08/19 0455 03/09/19 0349 03/10/19 0343 03/11/19 0355  NA 140  --  142 142 143 142  K 2.9*  --  3.0* 2.9* 2.8* 3.7  CL 109  --  109 110  110 111  CO2 25  --  _0 GLUCOSE 89  --  78 85 86 83  BUN 13  --  _1 CREATININE 0.53*  --  0.43* 0.39* 0.46* 0.51*  CALCIUM 9.2  --  9.2 9.0 9.2 9.3  MG  --  2.0  --  1.9  --  1.9   Liver Function Tests: No results for input(s): AST, ALT, ALKPHOS, BILITOT, PROT, ALBUMIN in the last 168 hours. No results for input(s): LIPASE, AMYLASE in the last 168 hours. No results for input(s): AMMONIA in the last 168 hours. CBC: Recent Labs  Lab 03/07/19 0236 03/08/19 0455 03/09/19 0349 03/10/19 0343 03/11/19 0355  WBC 6.7 5.6 5.5 5.9 5.2  HGB 10.4* 9.9* 9.9* 9.3* 9.8*  HCT 35.0* 32.1* 32.0* 30.8* 31.4*  MCV 104.5* 103.2* 103.2* 103.7* 103.3*  PLT 257 286 284 314 312   Cardiac Enzymes: No results for input(s): CKTOTAL, CKMB, CKMBINDEX, TROPONINI in the last 168 hours. BNP (last 3 results) No results for input(s): BNP in the last 8760 hours.  ProBNP (last 3 results) No results for input(s): PROBNP in the last 8760 hours.  CBG: No results for input(s): GLUCAP in the last 168 hours.  Recent Results (from the past 240 hour(s))  Aerobic/Anaerobic Culture (surgical/deep wound)     Status: None   Collection Time: 03/04/19  1:30 PM   Specimen: Wound; Tissue  Result Value Ref Range Status   Specimen Description TISSUE WOUND LEFT PELVIS  Final   Special Requests NONE  Final   Gram Stain   Final    RARE WBC PRESENT,BOTH PMN AND MONONUCLEAR FEW GRAM POSITIVE COCCI RARE GRAM VARIABLE ROD Performed at [facility], [place] [address], [place], [place] [address]    Culture   Final    FEW BACTEROIDES DISTASONIS BETA LACTAMASE POSITIVE FEW EUBACTERIUM LIMOSUM    Report Status 03/09/2019 FINAL  Final  Aerobic/Anaerobic Culture (surgical/deep wound)     Status: None   Collection Time: 03/04/19  1:40 PM   Specimen: Wound  Result Value Ref Range Status   Specimen Description WOUND LEFT PELVIS  Final   Special Requests SWAB SPEC B  Final   Gram Stain NO WBC SEEN RARE  GRAM VARIABLE ROD   Final   Culture   Final    MIXED ANAEROBIC FLORA PRESENT.  CALL LAB IF FURTHER IID REQUIRED. NO GROWTH AEROBICALLY Performed at [place] [address] [place], [place] [address]    Report Status 03/08/2019 FINAL  Final     Studies: No results found.  Scheduled Meds: . Chlorhexidine Gluconate Cloth  6 each Topical Q0600  . citalopram  20 mg Oral Daily  . enoxaparin (LOVENOX) injection  40 mg  Subcutaneous Q24H  . feeding supplement (ENSURE ENLIVE)  237 mL Oral BID BM  . feeding supplement (PRO-STAT SUGAR FREE 64)  30 mL Oral TID  . fentaNYL  1 patch Transdermal Q48H  . gabapentin  300 mg Oral TID  . influenza vac split quadrivalent PF  0.5 mL Intramuscular Tomorrow-1000  . loratadine  10 mg Oral Daily  . Melatonin  3 mg Oral QHS  . metroNIDAZOLE  500 mg Oral Q8H  . multivitamin with minerals  1 tablet Oral Daily  . pantoprazole  40 mg Oral Daily  . polyethylene glycol  17 g Oral Daily  . potassium chloride  40 mEq Oral TID  . QUEtiapine  50 mg Oral QHS  . senna-docusate  2 tablet Oral QHS  . sodium chloride flush  10-40 mL Intracatheter Q12H   Continuous Infusions: . sodium chloride 10 mL/hr at 03/09/19 1332  . ceFEPime (MAXIPIME) IV 2 g (03/11/19 0617)    Principal Problem:   Wound infection, posttraumatic Active Problems:   Anxiety and depression   Wound of buttock   Colostomy status (HCC)   Chronic pain due to trauma   Hydronephrosis, right   GERD (gastroesophageal reflux disease)   Suprapubic catheter (HCC)   Protein-calorie malnutrition, moderate (HCC)   Chronic osteomyelitis of pelvis, left ([place])    Time spent: >25 minutes     Kinnie Feil  [facility] Pager [phone]. If 7PM-7AM, please contact night-coverage at [email or web address], password [facility] 03/11/2019, 12:59 PM  LOS: 11 days

## 2019-03-12 MED ORDER — HEPARIN SOD (PORK) LOCK FLUSH 100 UNIT/ML IV SOLN
250.0000 [IU] | INTRAVENOUS | Status: AC | PRN
  Administered 2019-03-12: 250 [IU]

## 2019-03-12 MED ORDER — CHLORHEXIDINE GLUCONATE CLOTH 2 % EX PADS: 6.0000 | MEDICATED_PAD | Freq: Every day | CUTANEOUS | 1 refills | Status: DC

## 2019-03-12 MED ORDER — ENSURE ENLIVE PO LIQD: 237.0000 mL | Freq: Two times a day (BID) | ORAL | 12 refills | Status: DC

## 2019-03-12 MED ORDER — METRONIDAZOLE 500 MG PO TABS: 500.0000 mg | ORAL_TABLET | Freq: Three times a day (TID) | ORAL | 0 refills | Status: AC

## 2019-03-12 MED ORDER — CEFEPIME IV (FOR PTA / DISCHARGE USE ONLY): 2.0000 g | Freq: Three times a day (TID) | INTRAVENOUS | 0 refills | Status: AC

## 2019-03-12 MED ORDER — ACETAMINOPHEN 325 MG PO TABS: 650.0000 mg | ORAL_TABLET | Freq: Four times a day (QID) | ORAL | Status: DC | PRN

## 2019-03-12 MED ORDER — ADULT MULTIVITAMIN W/MINERALS CH: 1.0000 | ORAL_TABLET | Freq: Every day | ORAL | Status: AC

## 2019-03-12 NOTE — Progress Notes (Signed)
RN called [facility] and set up transport for home, confirming address on [address]. Pt made aware.

## 2019-03-12 NOTE — Progress Notes (Signed)
rn spoke with pam chandler from [facility] about pt discharge. Pt waiting on workers comp before discharge.all questions answered to satisfaction.  Kinley Dozier Elon Spanner, RN 03/12/2019 9:46 AM

## 2019-03-12 NOTE — Progress Notes (Signed)
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 Pt states that hewas involved in averysignificant trauma in January and has had chronic woundin leftbuttock areasince that time.Pt isfollowing up with plastic surgery as outpatient for wound management.He notedincreased drainage in the past several days.He hadmild fever at home. He is body temperature is 97.9 in ED. Patient was started with doxycycline last Thursday without improvement. He continues to have worsening pain, which is constant, sharp, 9 out of 10 in severity, nonradiating. Patient does not have chest pain, shortness breath, cough, nausea, vomiting, diarrhea, abdominal pain, symptoms of UTI. In ED pt was found to have WBC 5.8, lactic acid 1.5, negative COVID-19 test, electrolytes renal function okay, temperature normal, blood pressure 121/79, heart rate 94, oxygen saturation 94-97% on room air. Chest x-ray negative. CT of pelvisshowed possible chronic osteomyelitis,and kidney stone andright mild hydronephrosis. Patient is admitted to [facility] bed for observation. Urology,Dr. Junious Silk was consulted by EDP--Sincepatient's renal function is normal, pt will beappropriate for outpatient follow-up with urology.  Status-post removal of hardware pelvis and excision of superficial bone pelvis for osteomyelitis by ortho, Dr. Doreatha Martin and Dr. Marla Roe on 03/04/19. Cultures remain negative.PICC line has been placed infectious disease team has recommended 6 weeks of antibiotics  Assessment/Plan:  Left posterior hip wound/chronic osteomyelitis -S/P removal of hardware  pelvis and excision of superficial bone pelvis for osteomyelitis 03/04/19 by ortho, Dr. Doreatha Martin and Dr. Marla Roe -Elevated ESR and CRP - typical given osteomyelitis  -Continue IV antibiotics empirically. D/Ced IV vancomycin on 9/24, Cont. Cefepime and metronidazolex 6 wks -Surgical wound Gram stain grew gram-positive cocci and rare gram variable rods = Bacteroides distasonis -surgical Wound culture---Bacteroides distasonis -History of positive wound cultures with Pseudomonas and E. coli in the past. -Infectious disease consulted: -PICC line placement 03/09/2019 -Dr. Comer;DC'd IV vancomycin, recommended to continue  Cefepime and metronidazole for total of 6 weeks -end date April 14, 2019. ID follow up  clinic on 04/04/2019 at 44 Am with Dr. Linus Salmons -Plastic surgery and orthopedic surgery following  -per plastic surgery: Daily KY jelly/surgilube to the green mesh. Apply KY to 4x4            gauze then place over green mesh (sorbact). Please call if        any questions in regards to wound dressing changes.      Duoderm placed around wound to protect skin from tape.  Per orthopedics: f/u in 2 weeks as outpatient   Hypokalemia, resolved  - GI loss vs poor PO intake  -replace as needed  Severe protein calorie malnutrition BMI of 16 with albumin of 2.6 Nutritionist to assist with nutritional needs Encourage oral protein calorie intake Continue oral supplement  Physical debility/ambulatory dysfunction PT OT following-patient is unable to participate much due to pain/fatigue/at times lethargic due to medication -he has little motivation to participate Fall precautions  Asymptomatic pyuria - UTI ruled out Presented with pyuria but with insignificant growth on urine culture Currently on IV cefepime  Chronic pain syndrome Not opiate nave --continue current pain regiment On high doses of opiates at home Continue home fentanyl patch 50 mcg/h every other day Continue  gabapentin 300 mg 3 times daily, Robaxin 500 mg every 6 hours as  needed for muscle spasm Continue Percocet 5-325 mg 1 tablet every 4 hours as needed for severe pain  Chronic anxiety/depression -Continue home medication Celexa and Klonopin prn -Patient seems to have periodic anxiety attacks, PRN Ativan will be considered -More stable now  GERD Continue home medication  Right hydronephrosis with nephrolithiasis Suprapubic catheter in place - chronic Continue to monitor urine output and renal function -renal function remains stable  GERD Continue Protonix  Status post colostomy/suprapubic catheter, Remained stable, functional positive stool and urine. Continue   Code Status: full; Family Communication: d/w patient, RN (indicate person spoken with, relationship, and if by phone, the number) Disposition Plan: awaiting workers comp approval for discharge [place]. Iv atx    Consultants: Urology,Dr. Criss Alvine consulted, plastic surgery, orthopedic surgery. -Consulted infectious disease team Dr. Novella Olive 03/07/2019  Procedures:  Hardware removal   Antibiotics: Anti-infectives (From admission, onward)   Start     Dose/Rate Route Frequency Ordered Stop   03/08/19 1400  metroNIDAZOLE (FLAGYL) tablet 500 mg     500 mg Oral Every 8 hours 03/08/19 1339     03/06/19 1200  vancomycin (VANCOCIN) 1,750 mg in sodium chloride 0.9 % 500 mL IVPB  Status:  Discontinued     1,750 mg 250 mL/hr over 120 Minutes Intravenous Every 24 hours 03/06/19 1103 03/07/19 1407   03/04/19 1401  tobramycin (NEBCIN) powder  Status:  Discontinued       As needed 03/04/19 1401 03/04/19 1441   03/04/19 1359  vancomycin (VANCOCIN) powder  Status:  Discontinued       As needed 03/04/19 1400 03/04/19 1441   03/04/19 0600  ceFAZolin (ANCEF) IVPB 2g/100 mL premix     2 g 200 mL/hr over 30 Minutes Intravenous On call to O.R. 03/04/19 0000 03/04/19 1305   03/02/19 0600  vancomycin (VANCOCIN) 1,500 mg in sodium  chloride 0.9 % 500 mL IVPB  Status:  Discontinued     1,500 mg 250 mL/hr over 120 Minutes Intravenous Every 24 hours 03/01/19 1658 03/06/19 1103   03/01/19 1000  vancomycin (VANCOCIN) 1,250 mg in sodium chloride 0.9 % 250 mL IVPB  Status:  Discontinued     1,250 mg 166.7 mL/hr over 90 Minutes Intravenous Every 12 hours 02/28/19 2119 03/01/19 1658   03/01/19 0500  ceFEPIme (MAXIPIME) 2 g in sodium chloride 0.9 % 100 mL IVPB     2 g 200 mL/hr over 30 Minutes Intravenous Every 8 hours 02/28/19 2119     02/28/19 2130  vancomycin (VANCOCIN) 1,500 mg in sodium chloride 0.9 % 500 mL IVPB     1,500 mg 250 mL/hr over 120 Minutes Intravenous  Once 02/28/19 2116 03/01/19 0011   02/28/19 2100  ceFEPIme (MAXIPIME) 2 g in sodium chloride 0.9 % 100 mL IVPB     2 g 200 mL/hr over 30 Minutes Intravenous  Once 02/28/19 2057 02/28/19 2147   02/28/19 2100  vancomycin (VANCOCIN) IVPB 1000 mg/200 mL premix  Status:  Discontinued     1,000 mg 200 mL/hr over 60 Minutes Intravenous  Once 02/28/19 2057 02/28/19 2116       (indicate start date, and stop date if known)  HPI/Subjective: No acute issues overnight. No acute distress. Reports feeling better. No acute SOB.  -awaiting workers comp approval for discharge   Objective: Vitals:   03/12/19 0426 03/12/19 0757  BP: 128/88 132/87  Pulse: 73 75  Resp: 18 17  Temp: 97.9 F (36.6 C) (!) 97.5 F (36.4 C)  SpO2: 98% 96%  Intake/Output Summary (Last 24 hours) at 03/12/2019 1017 Last data filed at 03/12/2019 0300 Gross per 24 hour  Intake 480 ml  Output 1850 ml  Net -1370 ml   Filed Weights   02/28/19 1303 03/01/19 0650  Weight: 70.3 kg 60.8 kg    Exam:   General:  No distress   Cardiovascular: s1,s2 rrr  Respiratory: CTA BL   Abdomen: soft, nt   Musculoskeletal: no leg edema    Data Reviewed: Basic Metabolic Panel: Recent Labs  Lab 03/07/19 0236 03/07/19 0922 03/08/19 0455 03/09/19 0349 03/10/19 0343 03/11/19 0355  NA 140   --  142 142 143 142  K 2.9*  --  3.0* 2.9* 2.8* 3.7  CL 109  --  109 110 110 111  CO2 25  --  _0 GLUCOSE 89  --  78 85 86 83  BUN 13  --  _1 CREATININE 0.53*  --  0.43* 0.39* 0.46* 0.51*  CALCIUM 9.2  --  9.2 9.0 9.2 9.3  MG  --  2.0  --  1.9  --  1.9   Liver Function Tests: No results for input(s): AST, ALT, ALKPHOS, BILITOT, PROT, ALBUMIN in the last 168 hours. No results for input(s): LIPASE, AMYLASE in the last 168 hours. No results for input(s): AMMONIA in the last 168 hours. CBC: Recent Labs  Lab 03/07/19 0236 03/08/19 0455 03/09/19 0349 03/10/19 0343 03/11/19 0355  WBC 6.7 5.6 5.5 5.9 5.2  HGB 10.4* 9.9* 9.9* 9.3* 9.8*  HCT 35.0* 32.1* 32.0* 30.8* 31.4*  MCV 104.5* 103.2* 103.2* 103.7* 103.3*  PLT 257 286 284 314 312   Cardiac Enzymes: No results for input(s): CKTOTAL, CKMB, CKMBINDEX, TROPONINI in the last 168 hours. BNP (last 3 results) No results for input(s): BNP in the last 8760 hours.  ProBNP (last 3 results) No results for input(s): PROBNP in the last 8760 hours.  CBG: No results for input(s): GLUCAP in the last 168 hours.  Recent Results (from the past 240 hour(s))  Aerobic/Anaerobic Culture (surgical/deep wound)     Status: None   Collection Time: 03/04/19  1:30 PM   Specimen: Wound; Tissue  Result Value Ref Range Status   Specimen Description TISSUE WOUND LEFT PELVIS  Final   Special Requests NONE  Final   Gram Stain   Final    RARE WBC PRESENT,BOTH PMN AND MONONUCLEAR FEW GRAM POSITIVE COCCI RARE GRAM VARIABLE ROD Performed at [facility], [place] [address], [place], [place] [address]    Culture   Final    FEW BACTEROIDES DISTASONIS BETA LACTAMASE POSITIVE FEW EUBACTERIUM LIMOSUM    Report Status 03/09/2019 FINAL  Final  Aerobic/Anaerobic Culture (surgical/deep wound)     Status: None   Collection Time: 03/04/19  1:40 PM   Specimen: Wound  Result Value Ref Range Status   Specimen Description WOUND LEFT PELVIS   Final   Special Requests SWAB SPEC B  Final   Gram Stain NO WBC SEEN RARE GRAM VARIABLE ROD   Final   Culture   Final    MIXED ANAEROBIC FLORA PRESENT.  CALL LAB IF FURTHER IID REQUIRED. NO GROWTH AEROBICALLY Performed at [place] [address], [place], [place] [address]    Report Status 03/08/2019 FINAL  Final     Studies: No results found.  Scheduled Meds: . Chlorhexidine Gluconate Cloth  6 each Topical Q0600  . citalopram  20 mg Oral Daily  .  enoxaparin (LOVENOX) injection  40 mg Subcutaneous Q24H  . feeding supplement (ENSURE ENLIVE)  237 mL Oral BID BM  . feeding supplement (PRO-STAT SUGAR FREE 64)  30 mL Oral TID  . fentaNYL  1 patch Transdermal Q48H  . gabapentin  300 mg Oral TID  . influenza vac split quadrivalent PF  0.5 mL Intramuscular Tomorrow-1000  . loratadine  10 mg Oral Daily  . Melatonin  3 mg Oral QHS  . metroNIDAZOLE  500 mg Oral Q8H  . multivitamin with minerals  1 tablet Oral Daily  . pantoprazole  40 mg Oral Daily  . polyethylene glycol  17 g Oral Daily  . potassium chloride  40 mEq Oral TID  . QUEtiapine  50 mg Oral QHS  . senna-docusate  2 tablet Oral QHS  . sodium chloride flush  10-40 mL Intracatheter Q12H   Continuous Infusions: . sodium chloride 10 mL/hr at 03/09/19 1332  . ceFEPime (MAXIPIME) IV 2 g (03/12/19 4628)    Principal Problem:   Wound infection, posttraumatic Active Problems:   Anxiety and depression   Wound of buttock   Colostomy status (HCC)   Chronic pain due to trauma   Hydronephrosis, right   GERD (gastroesophageal reflux disease)   Suprapubic catheter (HCC)   Protein-calorie malnutrition, moderate (HCC)   Chronic osteomyelitis of pelvis, left ([place])    Time spent: >25 minutes     Kinnie Feil  [facility] Pager [phone]. If 7PM-7AM, please contact night-coverage at [email or web address], password [facility] 03/12/2019, 10:17 AM  LOS: 12 days

## 2019-03-12 NOTE — Progress Notes (Signed)
Pt belongings gathered and avs printed and reviewed. [facility] came and transported pt to home. [place], RN 03/12/2019 6:51 PM

## 2019-03-12 NOTE — Discharge Summary (Signed)
Physician Discharge Summary  Christopher [name]:[id number] DOB: [birth year] DOA: 02/28/2019  PCP: Vivi Barrack, MD  Admit date: 02/28/2019 Discharge date: 03/12/2019  Time spent: >35 minutes  Recommendations for Outpatient Follow-up:  ID clinic on 04/04/2019 at 27 Am with Dr. Linus Salmons PCP in 3-7 days  Plastic surgery in 2 weeks   Discharge Diagnoses:  Principal Problem:   Wound infection, posttraumatic Active Problems:   Anxiety and depression   Wound of buttock   Colostomy status ([place])   Chronic pain due to trauma   Hydronephrosis, right   GERD (gastroesophageal reflux disease)   Suprapubic catheter ([place])   Protein-calorie malnutrition, moderate ([facility])   Chronic osteomyelitis of pelvis, left ([place])   Discharge Condition: stable   Diet recommendation: regular. 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 Pt states that hewas involved in averysignificant trauma in January and has had chronic woundin leftbuttock areasince that time.Pt isfollowing up with plastic surgery as outpatient for wound management.He notedincreased drainage in the past several days.He hadmild fever at home. He is body temperature is 97.9 in ED. Patient was started with doxycycline last Thursday without improvement. He continues to have worsening pain, which is constant, sharp, 9 out of 10 in severity, nonradiating. Patient does not have chest pain, shortness breath, cough, nausea, vomiting, diarrhea, abdominal pain, symptoms of UTI. In ED pt was found to have WBC 5.8, lactic acid 1.5,negativeCOVID-19 test, electrolytes renal function okay,  temperature normal, blood pressure 121/79, heart rate 94, oxygen saturation 94-97% on room air. Chest x-ray negative. CT of pelvisshowed possible chronic osteomyelitis,and kidney stone andright mild hydronephrosis. Patient is admitted to [facility] bed for observation. Urology,Dr. Junious Silk was consulted by EDP--Sincepatient's renal function is normal, pt will beappropriate for outpatient follow-up with urology.  Status-post removal of hardware pelvis and excision of superficial bone pelvis for osteomyelitis by ortho, Dr. Doreatha Martin and Dr. Marla Roe on 03/04/19. Cultures remain negative.PICC line has been placed infectious disease team has recommended 6 weeks of antibiotics   Hospital Course:   Left posterior hip wound/chronic osteomyelitis -S/P removal of hardware pelvis and excision of superficial bone pelvis for osteomyelitis 03/04/19 by ortho, Dr. Doreatha Martin and Dr. Marla Roe -Elevated ESR and CRP - typical given osteomyelitis  -Continued IV antibiotics empirically. D/Ced IV vancomycin on 9/24. ID followed, recommended Cefepimeand metronidazolex 6 wks -Surgical wound Gram stain grew gram-positive cocci and rare gram variable rods= Bacteroides distasonis -surgicalWound culture---Bacteroides distasonis -History of positive wound cultures with Pseudomonas and E. coli in the past. -Infectious disease consulted: -PICC line placement 03/09/2019 -Dr. Comer;DC'd IV vancomycin, recommended to continue  Cefepimeand metronidazolefor total of 6 weeks -end date April 14, 2019.  -ID follow up clinic on 04/04/2019 at 82 Am with Dr. Linus Salmons -Plastic surgery and orthopedic surgery followed  -per plastic surgery: Daily KY jelly/surgilube to the green mesh. Apply KY to 4x4 gauze then place over green mesh (sorbact).Duoderm placed around wound to protect skin from tape.  Per orthopedics: f/u in 2 weeks as outpatient   Hypokalemia, resolved  - GI loss vs poor PO  intake  Severe protein calorie malnutrition BMI of 16 with albumin of 2.6 Nutritionist to assist with nutritional needs Encourage oral protein calorie intake Continue  oral supplement  Physical debility/ambulatory dysfunction PT OT following-patient is unable to participate much due to pain/fatigue/at times lethargic due to medication -he has little motivation to participate Fall precautions. He preferred to go home with home therapy   Asymptomatic pyuria - UTI ruled out Presented with pyuria but with insignificant growth on urine culture Treated with  IV cefepime  Chronic pain syndrome Not opiate nave --continue current pain regiment On high doses of opiates at home Continue home fentanyl patch 50 mcg/h every other day Continue gabapentin 300 mg 3 times daily, Robaxin 500 mg every 6 hours as needed for muscle spasm Continue Percocet 5-325 mg 1 tablet every 4 hours as needed for severe pain -patient follows at pain clinic   Chronic anxiety/depression -Continue home medication Celexa and Klonopinprn  GERD Continue home medication  Right hydronephrosis with nephrolithiasis Suprapubic catheter in place - chronic Continue to monitor urine output and renal function -renal function remains stable  GERD Continue Protonix  Status post colostomy/suprapubic catheter, Remained stable, functional positive stool and urine.   Procedures:  Hardware removal  (i.e. Studies not automatically included, echos, thoracentesis, etc; not x-rays)  Consultations: Urology,Dr. Criss Alvine consulted, plastic surgery, orthopedic surgery. -Consulted infectious disease team Dr. Novella Olive 03/07/2019  Discharge Exam: Vitals:   03/12/19 0426 03/12/19 0757  BP: 128/88 132/87  Pulse: 73 75  Resp: 18 17  Temp: 97.9 F (36.6 C) (!) 97.5 F (36.4 C)  SpO2: 98% 96%    General: no distress  Cardiovascular: s1,s2 rrr Respiratory: CTA BL  Discharge Instructions  Discharge  Instructions    Diet - low sodium heart healthy   Complete by: As directed    Discharge instructions   Complete by: As directed    Please follow up with primary care physician in 3-7 days F/u with ID clinic on 04/04/2019 at 109 Am with Dr. Linus Salmons Please follow up with plastic surgery in 2 weeks   Home infusion instructions [place] May follow [place] Dosing Protocol; May administer Cathflo as needed to maintain patency of vascular access device.; Flushing of vascular access device: per [facility] Protocol: 0.9% NaCl pre/post medica...   Complete by: As directed    Instructions: May follow [place] Dosing Protocol   Instructions: May administer Cathflo as needed to maintain patency of vascular access device.   Instructions: Flushing of vascular access device: per [facility] Protocol: 0.9% NaCl pre/post medication administration and prn patency; Heparin 100 u/ml, 13m for implanted ports and Heparin 10u/ml, 571mfor all other central venous catheters.   Instructions: May follow [facility] Anaphylaxis Protocol for First Dose Administration in the home: 0.9% NaCl at 25-50 ml/hr to maintain IV access for protocol meds. Epinephrine 0.3 ml IV/IM PRN and Benadryl 25-50 IV/IM PRN s/s of anaphylaxis.   Instructions: AdErdanfusion Coordinator (RN) to assist per patient IV care needs in the home PRN.   Increase activity slowly   Complete by: As directed      Allergies as of 03/12/2019   No Active Allergies     Medication List    STOP taking these medications   doxycycline 100 MG tablet Commonly known as: VIBRA-TABS     TAKE these medications   acetaminophen 325 MG tablet Commonly known as: TYLENOL Take 2 tablets (650 mg total) by mouth every 6 (six) hours as needed for mild pain (or Fever >/= 101).   ceFEPime  IVPB Commonly known as: MAXIPIME Inject 2 g into the vein every 8 (eight) hours. Indication:  Osteomyelitis/Wound infection Last Day of Therapy:  04/14/2019 Labs - Once  weekly:  CBC/D and BMP, Labs - Every other week:  ESR and CRP   Chlorhexidine Gluconate Cloth 2 % Pads Apply 6 each topically daily at 6 (six) AM. Start taking on: March 13, 2019   citalopram 20 MG tablet Commonly known as: CeleXA Take 1 tablet (20 mg total) by mouth daily. For mood/anxiety   clonazePAM 0.5 MG tablet Commonly known as: KLONOPIN Take 0.5 tablets (0.25 mg total) by mouth 3 (three) times daily as needed (anxiety).   [employer] Melatonin 3 MG Tabs Generic drug: Melatonin TAKE 1 TABLET (3 MG TOTAL) BY MOUTH AT BEDTIME.   dronabinol 2.5 MG capsule Commonly known as: Marinol Take 1 capsule (2.5 mg total) by mouth 2 (two) times daily before a meal. X 1 week then 5 mg 2x/day before meals   feeding supplement (ENSURE ENLIVE) Liqd Take 237 mLs by mouth 2 (two) times daily between meals. Start taking on: March 13, 2019   fentaNYL 50 MCG/HR Commonly known as: [place] 1 patch onto the skin every other day. Since dose wearing off early   gabapentin 300 MG capsule Commonly known as: NEURONTIN TAKE 1 CAPSULE BY MOUTH THREE TIMES A DAY   methocarbamol 500 MG tablet Commonly known as: ROBAXIN TAKE 1 TABLET BY MOUTH EVERY 6 HOURS AS NEEDED FOR MUSCLE SPASMS   metroNIDAZOLE 500 MG tablet Commonly known as: Flagyl Take 1 tablet (500 mg total) by mouth 3 (three) times daily.   Mucus Relief DM 30-600 MG Tb12 TAKE 1 TABLET BY MOUTH TWICE A DAY   multivitamin with minerals Tabs tablet Take 1 tablet by mouth daily. Start taking on: March 13, 2019   omeprazole 20 MG capsule Commonly known as: PRILOSEC Take 20 mg by mouth daily.   oxyCODONE-acetaminophen 5-325 MG tablet Commonly known as: Percocet Take 1 tablet by mouth every 4 (four) hours as needed for severe pain. Fill as of 8/262020- thank you   polyethylene glycol 17 g packet Commonly known as: MIRALAX / GLYCOLAX MIX AND TAKE 17 G BY MOUTH DAILY.   prochlorperazine 5 MG tablet Commonly known as:  COMPAZINE Take 1-2 tablets (5-10 mg total) by mouth every 6 (six) hours as needed for nausea.   QUEtiapine 50 MG tablet Commonly known as: SEROquel Take 1 tablet (50 mg total) by mouth at bedtime.   senna-docusate 8.6-50 MG tablet Commonly known as: Senokot-S Take 2 tablets by mouth at bedtime.            Home Infusion Instuctions  (From admission, onward)         Start     Ordered   03/12/19 0000  Home infusion instructions [facility] May follow [place] Dosing Protocol; May administer Cathflo as needed to maintain patency of vascular access device.; Flushing of vascular access device: per [facility] Protocol: 0.9% NaCl pre/post medica...    Question Answer Comment  Instructions May follow [place] Dosing Protocol   Instructions May administer Cathflo as needed to maintain patency of vascular access device.   Instructions Flushing of vascular access device: per [facility] Protocol: 0.9% NaCl pre/post medication administration and prn patency; Heparin 100 u/ml, 30m for implanted ports and Heparin 10u/ml, 552mfor all other central venous catheters.   Instructions May follow [facility] Anaphylaxis Protocol for First Dose Administration in the home: 0.9% NaCl at 25-50 ml/hr to maintain IV access for protocol meds. Epinephrine 0.3 ml IV/IM PRN and Benadryl 25-50 IV/IM  PRN s/s of anaphylaxis.   Instructions [facility] Infusion Coordinator (RN) to assist per patient IV care needs in the home PRN.      03/12/19 1343         No Active Allergies Follow-up Information    Dillingham, Loel Lofty, DO. Call in 2 weeks.   Specialty: Plastic Surgery Contact information: [place] [address] [place] [address] [phone]            The results of significant diagnostics from this hospitalization (including imaging, microbiology, ancillary and laboratory) are listed below for reference.    Significant Diagnostic Studies: Ct Abdomen Pelvis Wo Contrast  Result Date:  03/07/2019 CLINICAL DATA:  53 year old male with abdominal pain Recent explant pelvic hardware EXAM: CT ABDOMEN AND PELVIS WITHOUT CONTRAST TECHNIQUE: Multidetector CT imaging of the abdomen and pelvis was performed following the standard protocol without IV contrast. COMPARISON:  CT February 28, 2019 FINDINGS: Lower chest: Atelectasis at the lung bases. Nodularity at the base of the left lung, sub pulmonic, on the diaphragm, may reflect noncalcified plaque. No acute finding of the lung base. Hepatobiliary: Unremarkable liver.  Unremarkable gallbladder. Pancreas: Unremarkable Spleen: Unremarkable Adrenals/Urinary Tract: Unremarkable adrenal glands. Right hydronephrosis and dilation of the length of the ureter. There is a rounded stone at the right ureterovesical junction on image 76, which appears separate from the stone disease that was present previously. Chronic stones at the right bladder base in a linear configuration. In addition there are a cluster of small stones at the posterior lateral right collecting system as well as the more superior collecting system. No evidence of left-sided hydronephrosis. Small stone in the collecting system is unchanged. No evidence of dilated left ureter. Suprapubic catheter with balloon retention terminates in the urinary bladder which is relatively decompressed. Stomach/Bowel: Unremarkable stomach. Unremarkable colon which is decompressed. Unremarkable ostomy on the left abdominal wall, with Hartmann's pouch unremarkable. Normal appendix. There are few loops of bowel in the mid abdomen with air-fluid levels, borderline dilated, not well evaluated on the current CT. No discrete transition identified. Vascular/Lymphatic: Atherosclerotic changes again noted within the lower abdominal aorta and the iliac arteries. Changes of prior embolization within the right pelvis with coil mass present. Reproductive: Calcifications in the prostate. Other: No ventral wall hernia.  Musculoskeletal: Posttraumatic and postoperative changes of the pelvis. The previous surgical hardware of the left sacroiliac fixation has been removed, now with a soft tissue defect overlying the left sacrum and sacroiliac joint extending to the bone with partial debridement/bony excision. Redemonstration of partial healing/remodeling of the bilateral pubic rami fractures, sacral fracture, left iliac fracture. Heterotopic ossification within left gluteal musculature. The hardware of the left pubic ramus and of the proximal left femur remain in position. Postoperative changes on the deep aspect of the surgical site not well evaluated, with no evidence of large new fluid collection to indicate hematoma or new abscess. IMPRESSION: New right-sided hydronephrosis with obstructing stone at the right ureterovesical junction. Additional bilateral nonobstructing nephrolithiasis, similar to the comparison. Borderline dilated small bowel loops in the mid abdomen, presumably postoperative ileus given the recent surgery. No discrete transition identified. Interval surgical changes of left iliac debridement and explant of surgical hardware involving the sacroiliac fixation, not well evaluated given the absence of contrast. No complicating features identified. 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 Additional ancillary findings  as above. Electronically Signed   By: Corrie Mckusick D.O.   On: 03/07/2019 19:13   Ct Pelvis W Contrast  Result Date: 02/28/2019 CLINICAL DATA:  Retroperitoneal abscess EXAM: CT PELVIS WITH CONTRAST TECHNIQUE: Multidetector CT imaging of the pelvis was performed using the standard protocol following the bolus administration of intravenous contrast. CONTRAST:  127m OMNIPAQUE IOHEXOL 300 MG/ML  SOLN COMPARISON:  Radiograph  12/03/2018, 09/14/2018, CT 07/13/2018 FINDINGS: Urinary Tract: Small stone in the mid to lower pole of the right kidney. Incompletely visualized mild hydronephrosis right kidney. There is right hydroureter with mild urothelial enhancement. Suprapubic catheter in the bladder which is empty but thick-walled. Multiple coarse calcifications within the right posterior bladder measuring up to 1 cm in size. These are near the right UVJ. Distal left ureter is nonenlarged. Bowel: Left lower quadrant colostomy. No bowel wall thickening in the pelvis. Vascular/Lymphatic: Moderate aortic atherosclerosis. No aneurysm. Vascular coils in the region of right internal iliac vessels. No significantly enlarged lymph nodes. Reproductive:  Coarse prostate calcifications.  No masses Other: Prominent presacral soft tissue thickening, presumably due to residual surgical and post traumatic changes. Musculoskeletal: Numerous pelvic fractures are redemonstrated including comminuted right superior/pubic root and inferior pubic rami fractures, with interval development of callus. Comminuted left superior and inferior pubic rami fractures, also with interval callus formation. Residual mild widening of SI joints with single long screw fixation across both SI joints, this tip projects into the soft tissues lateral to the right iliac bone, and single fixating screw across the left SI joint. Suspicion of mild hardware loosening at the screw heads on the left side of the SI joint screws with separation of head of screw from ring fastener since prior CT. Displaced left posterior sacral fracture without significant bony bridging. Interval increase in fragmented appearance of left iliac bone and fracture fragments with heterogeneous lucency in dominant fracture fragment abutting the SI joint. Some sclerosis at the margins. Heterotopic bone formation in the left posterior hip soft tissues and gluteal region. Single fixating screw across the left superior  pubic ramus fracture with tip terminating near the, some osteolytic change about the tip of the screw. Generalized osteopenia in the pelvis. Threaded screw fixation of left femoral neck fracture with some sclerosis at fracture margin but residual visibility of fracture lucency. Slight sclerosis at the left femoral head but no femoral head collapse. Residual soft tissue thickening involving the left psoas and iliacus muscles. Heterogeneous attenuation of the left gluteal and hip musculature with atrophic changes. Fluid and edema within the subcutaneous soft tissues of the left buttock and left lateral hip though without rim enhancing collection to suggest an abscess. Diffuse loss of subcutaneous fatty tissues at the proximal thigh anteriorly. Probable surface wound overlying the left posterior ilium. IMPRESSION: 1. Extensive bilateral left greater than right pelvic fractures with surgical hardware fixating the SI joints, proximal left femur, and left acetabulum and superior pubic ramus. Interval increased fragmentation of comminuted left iliac bone fracture with heterogeneous attenuation of dominant fracture fragment abutting the left SI joint, possibly reflecting chronic osteomyelitis. Suspicion of some hardware loosening of the SI joint fixating screws, as a gap is now seen between the screw heads and the proximal ring shaped fastener. Mild osteolytic changes about the tip of the left acetabular fixating screw at the pubic symphysis. 2. Heterogeneous soft tissue thickening, edema and probable fluid involving the left gluteal and posterior and lateral hip soft tissues, but without rim enhancing organized soft tissue abscess. Interval atrophy of  left gluteal and hip musculature. Loss of subcutaneous fatty tissues with probable wound overlying the left posterior sacrum and iliac bone. 3. Suprapubic catheter with thickened bladder wall. Interim finding of mild right hydronephrosis and hydroureter, there are multiple  stones within the right posterior bladder near the UVJ. Urothelial enhancement on the right could reflect ascending urinary tract infection. Right kidney stone Electronically Signed   By: Donavan Foil M.D.   On: 02/28/2019 19:52   Dg Pelvis Comp Min 3v  Result Date: 03/04/2019 CLINICAL DATA:  Pelvic fracture EXAM: JUDET PELVIS - 3+ VIEW COMPARISON:  CT 12/03/2018.  Hip series 12/03/2018 FINDINGS: Catheter is noted over the pelvis. Surgical coils noted over the right pelvis. Extensive postsurgical changes left hip and pelvis with surgical screws in the left femoral neck, the left acetabulum/pubis, and across the left SI join. Again deformity noted of the left iliac wing and a process such as chronic osteomyelitis cannot be excluded. Again loosening about the left SI joint screw heads cannot be excluded. Fractures are again noted of the pubic rami bilaterally. T IMPRESSION: 1. Extensive postsurgical changes left hip and pelvis again noted. Again loosening of the left SI joint surgical screw heads cannot be excluded. Fractures are again noted of the pubic rami bilaterally. 2. Again deformity noted of the left iliac wing. Again chronic osteomyelitis cannot be excluded. Electronically Signed   By: Marcello Moores  Register   On: 03/04/2019 09:32   Dg Chest Portable 1 View  Result Date: 02/28/2019 CLINICAL DATA:  Fevers and soft tissue wound near the left hip EXAM: PORTABLE CHEST 1 VIEW COMPARISON:  11/19/2018 FINDINGS: Cardiac shadow is within normal limits. The lungs are well aerated bilaterally. No focal infiltrate or sizable effusion is seen. No soft tissue abnormality is noted. No bony abnormality is seen. IMPRESSION: No acute abnormality noted. Electronically Signed   By: Inez Catalina M.D.   On: 02/28/2019 20:50   Dg C-arm 1-60 Min-no Report  Result Date: 03/04/2019 Fluoroscopy was utilized by the requesting physician.  No radiographic interpretation.   [place] Ekg Site Rite  Result Date: 03/07/2019 If Site Rite  image not attached, placement could not be confirmed due to current cardiac rhythm.   Microbiology: Recent Results (from the past 240 hour(s))  Aerobic/Anaerobic Culture (surgical/deep wound)     Status: None   Collection Time: 03/04/19  1:30 PM   Specimen: Wound; Tissue  Result Value Ref Range Status   Specimen Description TISSUE WOUND LEFT PELVIS  Final   Special Requests NONE  Final   Gram Stain   Final    RARE WBC PRESENT,BOTH PMN AND MONONUCLEAR FEW GRAM POSITIVE COCCI RARE GRAM VARIABLE ROD Performed at [facility], [place] [address], [place], [place] [address]    Culture   Final    FEW BACTEROIDES DISTASONIS BETA LACTAMASE POSITIVE FEW EUBACTERIUM LIMOSUM    Report Status 03/09/2019 FINAL  Final  Aerobic/Anaerobic Culture (surgical/deep wound)     Status: None   Collection Time: 03/04/19  1:40 PM   Specimen: Wound  Result Value Ref Range Status   Specimen Description WOUND LEFT PELVIS  Final   Special Requests SWAB SPEC B  Final   Gram Stain NO WBC SEEN RARE GRAM VARIABLE ROD   Final   Culture   Final    MIXED ANAEROBIC FLORA PRESENT.  CALL LAB IF FURTHER IID REQUIRED. NO GROWTH AEROBICALLY Performed at [place] [address], [place], [place] [address]    Report  Status 03/08/2019 FINAL  Final     Labs: Basic Metabolic Panel: Recent Labs  Lab 03/07/19 0236 03/07/19 0922 03/08/19 0455 03/09/19 0349 03/10/19 0343 03/11/19 0355  NA 140  --  142 142 143 142  K 2.9*  --  3.0* 2.9* 2.8* 3.7  CL 109  --  109 110 110 111  CO2 25  --  _0 GLUCOSE 89  --  78 85 86 83  BUN 13  --  _1 CREATININE 0.53*  --  0.43* 0.39* 0.46* 0.51*  CALCIUM 9.2  --  9.2 9.0 9.2 9.3  MG  --  2.0  --  1.9  --  1.9   Liver Function Tests: No results for input(s): AST, ALT, ALKPHOS, BILITOT, PROT, ALBUMIN in the last 168 hours. No results for input(s): LIPASE, AMYLASE in the last 168 hours. No results for input(s): AMMONIA in the last 168  hours. CBC: Recent Labs  Lab 03/07/19 0236 03/08/19 0455 03/09/19 0349 03/10/19 0343 03/11/19 0355  WBC 6.7 5.6 5.5 5.9 5.2  HGB 10.4* 9.9* 9.9* 9.3* 9.8*  HCT 35.0* 32.1* 32.0* 30.8* 31.4*  MCV 104.5* 103.2* 103.2* 103.7* 103.3*  PLT 257 286 284 314 312   Cardiac Enzymes: No results for input(s): CKTOTAL, CKMB, CKMBINDEX, TROPONINI in the last 168 hours. BNP: BNP (last 3 results) No results for input(s): BNP in the last 8760 hours.  ProBNP (last 3 results) No results for input(s): PROBNP in the last 8760 hours.  CBG: No results for input(s): GLUCAP in the last 168 hours.     SignedKinnie Feil  [facility] 03/12/2019, 4:28 PM
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
## 2019-03-13 ENCOUNTER — Telehealth: Payer: Self-pay

## 2019-03-13 NOTE — Telephone Encounter (Signed)
Called and spoke with [name] (fiancee) back regarding the message below.  Informed her per [facility] the mesh in place,KY gel over the mesh,then 4x4 gauze,ABD pad, then tape or secure up underwear.  Do this daily.  Melanie asked when is the mesh to be taking off.  Informed her per [facility] he would like to see the patient in the office in a week, and she can leave the mesh on until the office visit or if the mesh looks bad she can remove the mesh and place adaptic on the area.  Asked [name] when would she like to bring the patient in next week for an office visit.  Asked her if she would like to come either (Thurs. 10/8 @ 3:00pm or Friday 10/9 @ 8:40am).  She stated they will come (Thurs. 10/8 @ 3:00pm).  Patient was scheduled and Melanie verbalized understanding and agreed to all the above.//AB/CMA

## 2019-03-13 NOTE — Telephone Encounter (Signed)
Received call from patient's fiancee, Melanie. Patient has returned home from hospital stay. [name] is asking for wound care instructions due to the patient's latest procedure. Please contact at [phone]

## 2019-03-14 NOTE — Progress Notes (Signed)
Preoperative Dx: Left hip wound  Postoperative Dx: Same  Procedure: Placement of Acell Sheet and Acell powder.  Provider: Roetta Sessions, PA-C  Anesthesia: None.   Indication for Procedure: Non healing left hip wound  Description of Procedure:   Risks and complications were explained to the patient.  Consent was confirmed. Time out was called and all information was confirmed to be correct. Procedure was performed at the bedside with assistance from nursing staff. 1 gram of Acell was applied directly into the wound bed, followed by a 7x10 Acell sheet. Sorbact was then placed over it followed KY jelly, sterile dressing. Duoderm was then applied to the peri-wound area and the sterile dressing was taped in place.   He tolerated the procedure well and there were no complications.

## 2019-03-20 ENCOUNTER — Telehealth: Payer: Self-pay

## 2019-03-20 NOTE — Telephone Encounter (Signed)

## 2019-03-21 ENCOUNTER — Other Ambulatory Visit: Payer: Self-pay

## 2019-03-21 ENCOUNTER — Ambulatory Visit: Payer: BC Managed Care – PPO | Admitting: Surgical

## 2019-03-21 ENCOUNTER — Telehealth: Payer: Self-pay

## 2019-03-21 MED ORDER — OXYCODONE-ACETAMINOPHEN 5-325 MG PO TABS: 1.0000 | ORAL_TABLET | ORAL | 0 refills | Status: DC | PRN

## 2019-03-21 NOTE — Telephone Encounter (Signed)
Reordered medication due to printer issue.

## 2019-03-21 NOTE — Telephone Encounter (Signed)
Melanie, pt fiance called stating pt requesting a refill on Percocet, last filled #[phone] on 02/08/2019. [facility], [place]

## 2019-03-22 ENCOUNTER — Ambulatory Visit (INDEPENDENT_AMBULATORY_CARE_PROVIDER_SITE_OTHER): Payer: No Typology Code available for payment source | Admitting: Surgical

## 2019-03-22 ENCOUNTER — Encounter: Payer: Self-pay | Admitting: Surgical

## 2019-03-22 ENCOUNTER — Other Ambulatory Visit: Payer: Self-pay

## 2019-03-22 VITALS — BP 109/70 | HR 82 | Temp 98.6°F

## 2019-03-22 DIAGNOSIS — S31809D Unspecified open wound of unspecified buttock, subsequent encounter: Secondary | ICD-10-CM

## 2019-03-22 NOTE — Progress Notes (Signed)
   Subjective:     Patient ID: Christopher [name], male    DOB: Oct 28, 1965, 53 y.o.   MRN: [id number]  Chief Complaint  Patient presents with  . Follow-up    from the Hospital visit    HPI: The patient is a 53 y.o. male here for follow-up after excision of left hip wound and placement of Acell on 03/04/19 by Dr. Marla Roe. He also had L hip hardware removal on that date by Dr. Doreatha Martin.  He is doing well. Less pain, walking more, eating more. Fiance reports that he has been more comfortable and even laughing and joking more. He is pleased with his progress since removal of hip hardware.  Denies fever, chills, n/v. They are getting married next week they stated.   He has been working with PT and doing great. He is also on IV cefepime multiple times daily as well as flagyl.  No complaints.  Review of Systems  Constitutional: Negative for chills, diaphoresis, fever, malaise/fatigue and weight loss.  Respiratory: Negative for cough, sputum production, shortness of breath and wheezing.   Cardiovascular: Negative for chest pain, palpitations and leg swelling.  Gastrointestinal: Negative for abdominal pain, diarrhea, nausea and vomiting.  Genitourinary: Negative.   Musculoskeletal: Positive for joint pain and myalgias. Negative for back pain and neck pain.  Skin: Negative for itching and rash.  Neurological: Positive for weakness. Negative for dizziness and headaches.     Objective:   Vital Signs BP 109/70 (BP Location: Right Arm, Patient Position: Sitting, Cuff Size: Normal)   Pulse 82   Temp 98.6 F (37 C) (Temporal)   SpO2 97%  Vital Signs and Nursing Note Reviewed  Physical Exam  Constitutional: He is oriented to person, place, and time. No distress.  Thin wm  HENT:  Head: Normocephalic and atraumatic.  Cardiovascular: Normal rate.  Pulmonary/Chest: Effort normal. No respiratory distress.  Abdominal: Soft. He exhibits no distension.  Ostomy bag L abdomen    Musculoskeletal:        General: Tenderness (L hip) present. No deformity or edema.       Legs:  Neurological: He is alert and oriented to person, place, and time.  Skin: Skin is warm and dry. No rash noted. He is not diaphoretic. No erythema. No pallor.  Psychiatric: Mood and affect normal.      Assessment/Plan:     ICD-10-CM   1. Wound of buttock, unspecified laterality, subsequent encounter  S31.809D   2. Wound of gluteal cleft, unspecified laterality, subsequent encounter  S31.809D     Donated Acell applied to left lateral hip wound post hardware removal. The wound is doing well overall, incorporating previously placed Acell. Wound packed with sterile packing gauze.  Donated Acell applied to intergluteal cleft wound, area appears non-infected.   Continue with daily dressing changes with KY jelly to adaptic on both wounds.  High protein, low carb/sugar diet.  F/u 2 weeks.  Pictures were obtained of the patient and placed in the chart with the patient's or guardian's permission.    Carola Rhine Donnie Panik, PA-C 03/22/2019, 5:14 PM

## 2019-03-25 ENCOUNTER — Telehealth: Payer: Self-pay

## 2019-03-25 NOTE — Telephone Encounter (Signed)
Ronalee Belts OT [facility] called requesting verbal orders for this patient for home health 1wk1 followed by 2wk2.   Called him back and approved verbal orders.

## 2019-03-26 ENCOUNTER — Encounter: Payer: Self-pay | Admitting: Family Medicine

## 2019-03-26 ENCOUNTER — Other Ambulatory Visit: Payer: Self-pay

## 2019-03-26 ENCOUNTER — Encounter
Payer: No Typology Code available for payment source | Attending: Physical Medicine and Rehabilitation | Admitting: Physical Medicine and Rehabilitation

## 2019-03-26 ENCOUNTER — Encounter: Payer: Self-pay | Admitting: Physical Medicine and Rehabilitation

## 2019-03-26 ENCOUNTER — Ambulatory Visit (INDEPENDENT_AMBULATORY_CARE_PROVIDER_SITE_OTHER): Payer: BC Managed Care – PPO | Admitting: Family Medicine

## 2019-03-26 VITALS — BP 105/72 | HR 79 | Resp 14

## 2019-03-26 DIAGNOSIS — M86652 Other chronic osteomyelitis, left thigh: Secondary | ICD-10-CM

## 2019-03-26 DIAGNOSIS — G8921 Chronic pain due to trauma: Secondary | ICD-10-CM | POA: Diagnosis present

## 2019-03-26 DIAGNOSIS — T07XXXA Unspecified multiple injuries, initial encounter: Secondary | ICD-10-CM | POA: Insufficient documentation

## 2019-03-26 DIAGNOSIS — R05 Cough: Secondary | ICD-10-CM

## 2019-03-26 DIAGNOSIS — E440 Moderate protein-calorie malnutrition: Secondary | ICD-10-CM | POA: Diagnosis not present

## 2019-03-26 DIAGNOSIS — G894 Chronic pain syndrome: Secondary | ICD-10-CM | POA: Diagnosis not present

## 2019-03-26 DIAGNOSIS — R059 Cough, unspecified: Secondary | ICD-10-CM

## 2019-03-26 DIAGNOSIS — M792 Neuralgia and neuritis, unspecified: Secondary | ICD-10-CM | POA: Diagnosis present

## 2019-03-26 MED ORDER — ALBUTEROL SULFATE HFA 108 (90 BASE) MCG/ACT IN AERS: 2.0000 | INHALATION_SPRAY | Freq: Four times a day (QID) | RESPIRATORY_TRACT | 1 refills | Status: DC | PRN

## 2019-03-26 MED ORDER — FENTANYL 50 MCG/HR TD PT72: 1.0000 | MEDICATED_PATCH | TRANSDERMAL | 0 refills | Status: DC

## 2019-03-26 MED ORDER — PRO-STAT SUGAR FREE PO LIQD: 30.0000 mL | Freq: Three times a day (TID) | ORAL | 0 refills | Status: DC

## 2019-03-26 MED ORDER — GUAIFENESIN ER 600 MG PO TB12: 1200.0000 mg | ORAL_TABLET | Freq: Two times a day (BID) | ORAL | 5 refills | Status: DC

## 2019-03-26 NOTE — Assessment & Plan Note (Signed)
No signs of infection.  Possibly has atelectasis.  Will increase Mucinex to 1200 mg twice daily.  Also start albuterol.  If no improvement, would consider further lung imaging and special referral to pulmonology.

## 2019-03-26 NOTE — Progress Notes (Signed)
    Chief Complaint:  Christopher [name] is a 53 y.o. male who presents today for a virtual office visit with a chief complaint of hospital follow-up for osteomyelitis.   Assessment/Plan:  Cough No signs of infection.  Possibly has atelectasis.  Will increase Mucinex to 1200 mg twice daily.  Also start albuterol.  If no improvement, would consider further lung imaging and special referral to pulmonology.  Chronic osteomyelitis of pelvis, left (HCC) Continue management per ID and orthopedics.  Currently on IV antibiotics until 04/14/2019.  Symptoms seem to be improving.  No signs of recurrence.  Protein-calorie malnutrition, moderate (HCC) We will send in prescription for protein supplementation.     Subjective:  HPI:  Patient was admitted to the hospital on 02/28/2019 with infected wound.  He went to the emergency room after his symptoms did not respond to doxycycline.  He had CT pelvis done which showed possible chronic osteomyelitis.  He underwent removal of hardware in his pelvis and superficial bone excision by orthopedics.  He was started on IV antibiotics.  ID was consulted and recommended that he complete a 6-week course of cefepime and metronidazole.  Plastic surgery was also consulted during his stay and will be following him as an outpatient for wound care.  Urology was consulted due to concern for nephrolithiasis and they will continue to follow him as an outpatient as well.  Patient was discharged on 03/12/2019  He has been mostly well since being discharged.  He is still having a bit of a lingering cough.  Occasionally productive of thick sputum.  Sometimes has difficulty getting this set.  Is tried taking Mucinex with modest improvement.  No other treatments tried.  No fevers or chills.  No obvious aggravating or alleviatin factors.        ROS: Per HPI  PMH: He reports that he quit smoking about 9 months ago. His smoking use included cigarettes. He has a 20.00 pack-year  smoking history. He has never used smokeless tobacco. He reports that he does not drink alcohol or use drugs.      Objective/Observations  Physical Exam: Gen: NAD, resting comfortably Pulm: Normal work of breathing Neuro: Grossly normal, moves all extremities Psych: Normal affect and thought content  No results found for this or any previous visit (from the past 24 hour(s)).   Virtual Visit via Video   I connected with Christopher [name] on 03/26/19 at 11:00 AM EDT by a video enabled telemedicine application and verified that I am speaking with the correct person using two identifiers. I discussed the limitations of evaluation and management by telemedicine and the availability of in person appointments. The patient expressed understanding and agreed to proceed.   Patient location: Home Provider location: [place] participating in the virtual visit: Myself and Patient     Algis Greenhouse. Jerline Pain, MD 03/26/2019 11:39 AM

## 2019-03-26 NOTE — Assessment & Plan Note (Signed)
We will send in prescription for protein supplementation.
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
## 2019-03-26 NOTE — Progress Notes (Signed)
Subjective:    Patient ID: Christopher [name], male    DOB: [birth year], 53 y.o.   MRN: [id number]  HPI  CC: chronic pain     On IV ABX- 3x/day- Cefipime per fiance' They removed a 4 inch screw from L hip Also cleaned the wound out pretty well- wound much bigger 1/x4 x 4 cm  Pain much better- still on Fentanyl- still on Oxycodone as well   Has been taking Marinol- really nauseated on 2 tabs BID- x 2 days- backed off to 1 tab BID eating like horse- hard to tell if pain better due to Marinol or surgery.  Sitting up more- walking more with RW- getting up every day now. His pain is much improved- more himself- personality coming back- 6-7/10 versus 9-10/10 like before all the time.  PCP ordered Prostat "shots" to help with nutrition and wound healing. Also ordered outside hospital- will start on Albuterol inhaler- clear thick mucus- increase to Mucinex 2 tabs BID and added inhaler-   Getting into w/c now- pain more manageable sat in w/c for 3.5 hours the other day. Yesterday 90 days- sat in regular chair.   PT noted a leg length discrepancy- R side longer than L- per pt/wife. Only 60 degrees ROM of L hip. Back side of pelvis said it's not completely healed yet- per Dr Jena Gauss.      Social Hx: Getting married on Sunday- after 18 years. Getting married at home- pastor will come to house- and inviting mom and dad. Fiance' working full time from home; raising and home schooling 52 yr old and caring for pt.  Pain Inventory Average Pain 7 Pain Right Now 7 My pain is sharp, burning, tingling and aching  In the last 24 hours, has pain interfered with the following? General activity 4 Relation with others 4 Enjoyment of life 8 What TIME of day is your pain at its worst? evening Sleep (in general) Fair  Pain is worse with: walking, bending, sitting and standing Pain improves with: medication Relief from Meds: 8  Mobility walk with assistance use a walker how many minutes  can you walk? 10 ability to climb steps?  no do you drive?  no use a wheelchair needs help with transfers Do you have any goals in this area?  yes  Function I need assistance with the following:  feeding, dressing, bathing, toileting, meal prep, household duties and shopping Do you have any goals in this area?  yes  Neuro/Psych weakness tremor tingling trouble walking spasms dizziness depression anxiety  Prior Studies Any changes since last visit?  no  Physicians involved in your care Any changes since last visit?  no   Family History  Problem Relation Age of Onset  . Breast cancer Mother        with mets to the bones   Social History   Socioeconomic History  . Marital status: Single    Spouse name: Not on file  . Number of children: Not on file  . Years of education: Not on file  . Highest education level: Not on file  Occupational History  . Not on file  Social Needs  . Financial resource strain: Not very hard  . Food insecurity    Worry: Sometimes true    Inability: Sometimes true  . Transportation needs    Medical: No    Non-medical: No  Tobacco Use  . Smoking status: Former Smoker    Packs/day: 1.00    Years: 20.00  Pack years: 20.00    Types: Cigarettes    Quit date: 06/15/2018    Years since quitting: 0.7  . Smokeless tobacco: Never Used  Substance and Sexual Activity  . Alcohol use: Never    Frequency: Never    Comment: "Every now and then"  . Drug use: Never  . Sexual activity: Yes  Lifestyle  . Physical activity    Days per week: 0 days    Minutes per session: 0 min  . Stress: Rather much  Relationships  . Social Herbalist on phone: Not on file    Gets together: Not on file    Attends religious service: Not on file    Active member of club or organization: Not on file    Attends meetings of clubs or organizations: Not on file    Relationship status: Not on file  Other Topics Concern  . Not on file  Social History  Narrative   ** Merged History Encounter **       Past Surgical History:  Procedure Laterality Date  . APPLICATION OF A-CELL OF BACK N/A 08/06/2018   Procedure: Application Of A-Cell Of Back;  Surgeon: [name], DO;  Location: [place];  Service: Plastics;  Laterality: N/A;  . APPLICATION OF A-CELL OF EXTREMITY Left 08/06/2018   Procedure: Application Of A-Cell Of Extremity;  Surgeon: [name], DO;  Location: [place];  Service: Plastics;  Laterality: Left;  . APPLICATION OF WOUND VAC  07/12/2018   Procedure: Application Of Wound Vac to the Left Thigh and Scrotum.;  Surgeon: Shona Needles, MD;  Location: [place];  Service: Orthopedics;;  . APPLICATION OF WOUND VAC  07/10/2018   Procedure: Application Of Wound Vac;  Surgeon: Clovis Riley, MD;  Location: [place];  Service: General;;  . COLOSTOMY N/A 07/23/2018   Procedure: COLOSTOMY;  Surgeon: Georganna Skeans, MD;  Location: [place];  Service: General;  Laterality: N/A;  . CYSTOSCOPY W/ URETERAL STENT PLACEMENT N/A 07/15/2018   Procedure: RETROGRADE URETHROGRAM;  Surgeon: Franchot Gallo, MD;  Location: [place];  Service: Urology;  Laterality: N/A;  . DEBRIDEMENT AND CLOSURE WOUND Left 03/04/2019   Procedure: Excision of hip wound with placement of Acell;  Surgeon: [name], DO;  Location: [place];  Service: Plastics;  Laterality: Left;  . ESOPHAGOGASTRODUODENOSCOPY N/A 08/14/2018   Procedure: ESOPHAGOGASTRODUODENOSCOPY (EGD);  Surgeon: Georganna Skeans, MD;  Location: [place];  Service: General;  Laterality: N/A;  bedside  . FACIAL RECONSTRUCTION SURGERY     X 2--once as a teenager and second time in his 36's  . HARDWARE REMOVAL Left 03/04/2019   Procedure: Left Hip Hardware Removal;  Surgeon: Shona Needles, MD;  Location: [place];  Service: Orthopedics;  Laterality: Left;  . HIP PINNING,CANNULATED Left 07/12/2018   Procedure: CANNULATED HIP PINNING;  Surgeon: Shona Needles, MD;  Location: [place];  Service:  Orthopedics;  Laterality: Left;  . HIP SURGERY    . I&D EXTREMITY Left 07/25/2018   Procedure: Debridement of buttock, scrotum and left leg, placement of acell and vac;  Surgeon: [name], DO;  Location: [place];  Service: Plastics;  Laterality: Left;  . I&D EXTREMITY N/A 08/06/2018   Procedure: Debridement of buttock, scrotum and left leg;  Surgeon: [name], DO;  Location: [place];  Service: Plastics;  Laterality: N/A;  . I&D EXTREMITY N/A 08/13/2018   Procedure: Debridement of buttock, scrotum and left leg, placement of acell  and vac;  Surgeon: Peggye Form, DO;  [facility];  Service: Plastics;  Laterality: N/A;  90 min, please  . INCISION AND DRAINAGE OF WOUND N/A 07/18/2018   Procedure: Debridement of left leg, buttocks and scrotal wound with placement of acell and Flexiseal;  Surgeon: Peggye Form, DO;  Location[facility];  Service: Plastics;  Laterality: N/A;  . INCISION AND DRAINAGE OF WOUND Left 08/29/2018   Procedure: Debridement of buttock, scrotum and left leg, placement of acell and vac;  Surgeon: Peggye Form, DO;  [facility];  Service: Plastics;  Laterality: Left;  75 min, please  . INCISION AND DRAINAGE OF WOUND Bilateral 10/23/2018   Procedure: DEBRIDEMENT OF BUTTOCK,SCROTUM, AND LEG WOUNDS WITH PLACEMENT OF ACELL- BILATERAL 90 MIN;  Surgeon: Peggye Form, DO;  [facility];  Service: Plastics;  Laterality: Bilateral;  . IR ANGIOGRAM PELVIS SELECTIVE OR SUPRASELECTIVE  07/10/2018  . IR ANGIOGRAM PELVIS SELECTIVE OR SUPRASELECTIVE  07/10/2018  . IR ANGIOGRAM SELECTIVE EACH ADDITIONAL VESSEL  07/10/2018  . IR EMBO ART  VEN HEMORR LYMPH EXTRAV  INC GUIDE ROADMAPPING  07/10/2018  . IR US GUIDE BX ASP/DRAIN  07/10/2018  . IR US GUIDE VASC ACCESS RIGHT  07/10/2018  . IR VENO/EXT/UNI LEFT  07/10/2018  . IRRIGATION AND DEBRIDEMENT OF WOUND WITH SPLIT THICKNESS SKIN GRAFT Left 09/19/2018   Procedure: Debridement of gluteal wound with  placement of acell to left leg.;  Surgeon: Peggye Form, DO;  [facility];  Service: Plastics;  Laterality: Left;  2.5 hours, please  . LAPAROTOMY N/A 07/12/2018   Procedure: EXPLORATORY LAPAROTOMY;  Surgeon: Violeta Gelinas, MD;  Location: [facility] OR;  Service: General;  Laterality: N/A;  . LAPAROTOMY N/A 07/15/2018   Procedure: WOUND EXPLORATION; CLOSURE OF ABDOMEN;  Surgeon: Violeta Gelinas, MD;  Location: [facility] OR;  Service: General;  Laterality: N/A;  . LAPAROTOMY  07/10/2018   Procedure: Exploratory Laparotomy;  Surgeon: Berna Bue, MD;  Location: [facility] OR;  Service: General;;  . PEG PLACEMENT N/A 08/14/2018   Procedure: PERCUTANEOUS ENDOSCOPIC GASTROSTOMY (PEG) PLACEMENT;  Surgeon: Violeta Gelinas, MD;  Location: [facility] ENDOSCOPY;  Service: General;  Laterality: N/A;  . PERCUTANEOUS TRACHEOSTOMY N/A 08/02/2018   Procedure: PERCUTANEOUS TRACHEOSTOMY;  Surgeon: Violeta Gelinas, MD;  Location: [facility] OR;  Service: General;  Laterality: N/A;  . RADIOLOGY WITH ANESTHESIA N/A 07/10/2018   Procedure: IR WITH ANESTHESIA;  Surgeon: Simonne Come, MD;  Location: [facility] OR;  Service: Radiology;  Laterality: N/A;  . RADIOLOGY WITH ANESTHESIA Right 07/10/2018   Procedure: Ir With Anesthesia;  Surgeon: Simonne Come, MD;  Location: [facility] OR;  Service: Radiology;  Laterality: Right;  . SCROTAL EXPLORATION N/A 07/15/2018   Procedure: SCROTUM DEBRIDEMENT;  Surgeon: Marcine Matar, MD;  Location: [facility] OR;  Service: Urology;  Laterality: N/A;  . SHOULDER SURGERY    . SKIN SPLIT GRAFT Right 09/19/2018   Procedure: Skin Graft Split Thickness;  Surgeon: Peggye Form, DO;  [facility];  Service: Plastics;  Laterality: Right;  . SKIN SPLIT GRAFT N/A 10/03/2018   Procedure: Split thickness skin graft to gluteal area with acell placement;  Surgeon: Peggye Form, DO;  [facility];  Service: Plastics;  Laterality: N/A;  3 hours, please  . VACUUM ASSISTED CLOSURE CHANGE N/A 07/12/2018   Procedure: ABDOMINAL VACUUM  ASSISTED CLOSURE CHANGE and abdominal washout;  Surgeon: Violeta Gelinas, MD;  Location: [facility] OR;  Service: General;  Laterality: N/A;  . WOUND DEBRIDEMENT Left 07/23/2018  Procedure: DEBRIDEMENT LEFT BUTTOCK  WOUND;  Surgeon: Violeta Gelinashompson, [place], MD;  [facility];  Service: General;  Laterality: Left;  . WOUND EXPLORATION Left 07/10/2018   Procedure: WOUND EXPLORATION LEFT GROIN;  Surgeon: Berna Bueonnor, Chelsea A, MD;  [facility];  Service: General;  Laterality: Left;   Past Medical History:  Diagnosis Date  . Acute on chronic respiratory failure with hypoxia (HCC)   . Bacteremia due to Pseudomonas   . Chronic pain syndrome   . Multiple traumatic injuries    BP 105/72   Pulse 79   Resp 14   SpO2 94%   Opioid Risk Score:   Fall Risk Score:  `1  Depression screen PHQ 2/9  Depression screen PHQ 2/9 12/27/2018  Decreased Interest 2  Down, Depressed, Hopeless 0  PHQ - 2 Score 2  Altered sleeping 1  Tired, decreased energy 2  Change in appetite 1  Feeling bad or failure about yourself  1  Trouble concentrating 0  Moving slowly or fidgety/restless 2  Suicidal thoughts 0  PHQ-9 Score 9    Review of Systems  Constitutional: Positive for appetite change.  HENT: Negative.   Eyes: Negative.   Respiratory: Positive for cough.   Endocrine: Negative.   Genitourinary: Negative.   Musculoskeletal:       Spasms   Neurological: Positive for dizziness, tremors and weakness.       Tingling  Psychiatric/Behavioral: Positive for dysphoric mood. The patient is nervous/anxious.        Objective:   Physical Exam Awake, alert, appropriate, accompanied by wife/fiance', on stretcher on R side, NAD (has a power w/c) RRR Coarse breath sounds; a few wheezes, good air movement B/L abd soft, NT, ND; (+) colostomy- At least 2 inches leg length discrepancy- L shorter than right. Back popped when turned to side- very painful Only 45 degrees of L hip ROM/ flexion- Has suprapubic catheter         Assessment & Plan:   Pt is a 53 yr old male with hx of Multitrauma- causing L femoral neck fx, degloving of L hip to going/scrotum, bladder neck trauma- got SPC, developed compartment syndrome -s/p surgery for that; also diverting colostomy, skin grafts, and s/p trach and PEG- PEG is out. 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 S/P screw removal and on IV ABX for L hip osteomyelitis. 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     1 Renewed Fentanyl refilled with 15 patches  2. Use w/c pressure relieving cushion when sitting, wherever he sits.  3. Leg Length discrepancy- will order for patient to have 2 pairs of shoes done for leg length discrepancy. [employer] for prosthetist.   4. Need to drink MORE WATER- not caffeinated- at least 2L/day. More thick mucus  5. Will con't oxycodone and Fentanyl Rx's- when need to be refilled.  6. Would like pt to come to next appointment in power w/c, if possible.  7. F/U in 6 weeks  I spent a total of 40 minutes on appointment- more than 20 minutes going over plan, focused on leg length discrepancy.

## 2019-03-26 NOTE — Assessment & Plan Note (Signed)
Continue management per ID and orthopedics.  Currently on IV antibiotics until 04/14/2019.  Symptoms seem to be improving.  No signs of recurrence.

## 2019-03-28 ENCOUNTER — Other Ambulatory Visit: Payer: Self-pay | Admitting: Physical Medicine and Rehabilitation

## 2019-03-28 NOTE — Telephone Encounter (Signed)
Sent it in- thank you

## 2019-03-28 NOTE — Telephone Encounter (Signed)
Sent it in- thank you

## 2019-03-29 ENCOUNTER — Ambulatory Visit: Payer: BC Managed Care – PPO | Admitting: Family Medicine

## 2019-03-30 ENCOUNTER — Other Ambulatory Visit: Payer: Self-pay

## 2019-03-30 ENCOUNTER — Emergency Department (HOSPITAL_COMMUNITY): Payer: BC Managed Care – PPO

## 2019-03-30 ENCOUNTER — Emergency Department (HOSPITAL_COMMUNITY)
Admission: EM | Admit: 2019-03-30 | Discharge: 2019-03-30 | Disposition: A | Discharge status: Home / Self Care | Payer: BC Managed Care – PPO | Attending: Emergency Medicine | Admitting: Emergency Medicine

## 2019-03-30 ENCOUNTER — Encounter (HOSPITAL_COMMUNITY): Payer: Self-pay

## 2019-03-30 DIAGNOSIS — Z79899 Other long term (current) drug therapy: Secondary | ICD-10-CM | POA: Insufficient documentation

## 2019-03-30 DIAGNOSIS — Z87891 Personal history of nicotine dependence: Secondary | ICD-10-CM | POA: Insufficient documentation

## 2019-03-30 DIAGNOSIS — M25552 Pain in left hip: Secondary | ICD-10-CM | POA: Diagnosis present

## 2019-03-30 DIAGNOSIS — N200 Calculus of kidney: Secondary | ICD-10-CM | POA: Insufficient documentation

## 2019-03-30 LAB — CBC WITH DIFFERENTIAL/PLATELET
Abs Immature Granulocytes: 0.1 10*3/uL — ABNORMAL HIGH (ref 0.00–0.07)
Basophils Absolute: 0 10*3/uL (ref 0.0–0.1)
Basophils Relative: 0 %
Eosinophils Absolute: 0.2 10*3/uL (ref 0.0–0.5)
Eosinophils Relative: 2 %
HCT: 40.6 % (ref 39.0–52.0)
Hemoglobin: 12.4 g/dL — ABNORMAL LOW (ref 13.0–17.0)
Immature Granulocytes: 1 %
Lymphocytes Relative: 9 %
Lymphs Abs: 1 10*3/uL (ref 0.7–4.0)
MCH: 31.8 pg (ref 26.0–34.0)
MCHC: 30.5 g/dL (ref 30.0–36.0)
MCV: 104.1 fL — ABNORMAL HIGH (ref 80.0–100.0)
Monocytes Absolute: 0.5 10*3/uL (ref 0.1–1.0)
Monocytes Relative: 4 %
Neutro Abs: 9.4 10*3/uL — ABNORMAL HIGH (ref 1.7–7.7)
Neutrophils Relative %: 84 %
Platelets: 306 10*3/uL (ref 150–400)
RBC: 3.9 MIL/uL — ABNORMAL LOW (ref 4.22–5.81)
RDW: 15.1 % (ref 11.5–15.5)
WBC: 11.1 10*3/uL — ABNORMAL HIGH (ref 4.0–10.5)
nRBC: 0 % (ref 0.0–0.2)

## 2019-03-30 LAB — URINALYSIS, ROUTINE W REFLEX MICROSCOPIC
Bilirubin Urine: NEGATIVE
Glucose, UA: NEGATIVE mg/dL
Ketones, ur: NEGATIVE mg/dL
Nitrite: NEGATIVE
Protein, ur: NEGATIVE mg/dL
Specific Gravity, Urine: 1.031 — ABNORMAL HIGH (ref 1.005–1.030)
pH: 5 (ref 5.0–8.0)

## 2019-03-30 LAB — COMPREHENSIVE METABOLIC PANEL
ALT: 20 U/L (ref 0–44)
AST: 26 U/L (ref 15–41)
Albumin: 3.1 g/dL — ABNORMAL LOW (ref 3.5–5.0)
Alkaline Phosphatase: 101 U/L (ref 38–126)
Anion gap: 12 (ref 5–15)
BUN: 15 mg/dL (ref 6–20)
CO2: 21 mmol/L — ABNORMAL LOW (ref 22–32)
Calcium: 9.9 mg/dL (ref 8.9–10.3)
Chloride: 107 mmol/L (ref 98–111)
Creatinine, Ser: 0.79 mg/dL (ref 0.61–1.24)
GFR calc Af Amer: >60 mL/min (ref >60)
GFR calc non Af Amer: >60 mL/min (ref >60)
Glucose, Bld: 129 mg/dL — ABNORMAL HIGH (ref 70–99)
Potassium: 3.3 mmol/L — ABNORMAL LOW (ref 3.5–5.1)
Sodium: 140 mmol/L (ref 135–145)
Total Bilirubin: 0.7 mg/dL (ref 0.3–1.2)
Total Protein: 7.6 g/dL (ref 6.5–8.1)

## 2019-03-30 LAB — LACTIC ACID, PLASMA
Lactic Acid, Venous: 3.4 mmol/L (ref 0.5–1.9)
Lactic Acid, Venous: 1.3 mmol/L (ref 0.5–1.9)

## 2019-03-30 MED ORDER — HYDROMORPHONE HCL 1 MG/ML IJ SOLN
1.0000 mg | Freq: Once | INTRAMUSCULAR | Status: AC
  Administered 2019-03-30: 1 mg via INTRAVENOUS
  Filled 2019-03-30: qty 1

## 2019-03-30 MED ORDER — TAMSULOSIN HCL 0.4 MG PO CAPS: 0.4000 mg | ORAL_CAPSULE | Freq: Every day | ORAL | 0 refills | Status: DC

## 2019-03-30 MED ORDER — KETOROLAC TROMETHAMINE 30 MG/ML IJ SOLN
15.0000 mg | Freq: Once | INTRAMUSCULAR | Status: AC
  Administered 2019-03-30: 15 mg via INTRAVENOUS
  Filled 2019-03-30: qty 1

## 2019-03-30 MED ORDER — LACTATED RINGERS IV BOLUS
1000.0000 mL | Freq: Once | INTRAVENOUS | Status: AC
  Administered 2019-03-30: 1000 mL via INTRAVENOUS

## 2019-03-30 MED ORDER — SODIUM CHLORIDE 0.9 % IV SOLN
2.0000 g | Freq: Once | INTRAVENOUS | Status: AC
  Administered 2019-03-30: 2 g via INTRAVENOUS
  Filled 2019-03-30: qty 2

## 2019-03-30 MED ORDER — IBUPROFEN 400 MG PO TABS: 400.0000 mg | ORAL_TABLET | Freq: Four times a day (QID) | ORAL | 0 refills | Status: DC

## 2019-03-30 MED ORDER — IOHEXOL 300 MG/ML  SOLN
100.0000 mL | Freq: Once | INTRAMUSCULAR | Status: AC | PRN
  Administered 2019-03-30: 100 mL via INTRAVENOUS

## 2019-03-30 MED ORDER — ONDANSETRON HCL 4 MG/2ML IJ SOLN
4.0000 mg | Freq: Once | INTRAMUSCULAR | Status: AC
  Administered 2019-03-30: 4 mg via INTRAVENOUS
  Filled 2019-03-30: qty 2

## 2019-03-30 NOTE — ED Notes (Signed)
Pt in CT.

## 2019-03-30 NOTE — ED Provider Notes (Signed)
[facility] DEPT Provider Note   CSN: [id number] Arrival date & time: 03/30/19  0014     History   Chief Complaint Chief Complaint  Patient presents with  . Hip Pain    left    HPI Christopher [name] is a 53 y.o. male.     The history is provided by the patient and medical records.  Hip Pain This is a new problem. The current episode started 3 to 5 hours ago. The problem occurs constantly. The problem has not changed since onset.Associated symptoms include abdominal pain. Pertinent negatives include no chest pain, no headaches and no shortness of breath. Nothing aggravates the symptoms. Nothing relieves the symptoms. He has tried nothing for the symptoms.    Past Medical History:  Diagnosis Date  . Acute on chronic respiratory failure with hypoxia ([place])   . Bacteremia due to Pseudomonas   . Chronic pain syndrome   . Multiple traumatic injuries     Patient Active Problem List   Diagnosis Date Noted  . Cough 03/26/2019  . Chronic osteomyelitis of pelvis, left ([place]) 03/07/2019  . Wound infection, posttraumatic 02/28/2019  . Hydronephrosis, right 02/28/2019  . GERD (gastroesophageal reflux disease) 02/28/2019  . Suprapubic catheter ([place]) 02/28/2019  . Protein-calorie malnutrition, moderate ([place]) 02/28/2019  . 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 Reactive depression 01/29/2019  . Chronic pain due to trauma 01/29/2019  . Neuropathic pain 01/29/2019  . Colostomy status ([place]) 01/14/2019  . Insomnia 01/14/2019  . Tachycardia 01/14/2019  . Current use of proton pump inhibitor 01/14/2019  . Wound of buttock 12/28/2018  . Anxiety and depression 12/17/2018  . Debility 11/23/2018  . Chronic pain syndrome   . Multiple traumatic injuries   . Pressure injury of skin 08/13/2018  . Urethral injury 07/13/2018    Past Surgical History:  Procedure Laterality Date  . APPLICATION OF A-CELL OF BACK N/A 08/06/2018   Procedure: Application Of A-Cell Of Back;  Surgeon: [name], DO;  Location: [place];  Service: Plastics;  Laterality: N/A;  . APPLICATION OF A-CELL OF EXTREMITY Left 08/06/2018   Procedure: Application Of A-Cell Of Extremity;  Surgeon: [name], DO;  Location: [place];  Service: Plastics;  Laterality: Left;  . APPLICATION OF WOUND VAC  07/12/2018   Procedure: Application Of Wound Vac to the Left Thigh and Scrotum.;  Surgeon: Shona Needles, MD;  Location: [facility];  Service: Orthopedics;;  . APPLICATION OF WOUND VAC  07/10/2018   Procedure: Application Of Wound Vac;  Surgeon: Clovis Riley, MD;  Location: [place];  Service: General;;  . COLOSTOMY N/A 07/23/2018   Procedure: COLOSTOMY;  Surgeon: Georganna Skeans, MD;  Location: [place];  Service: General;  Laterality: N/A;  . CYSTOSCOPY W/ URETERAL STENT PLACEMENT N/A 07/15/2018   Procedure: RETROGRADE URETHROGRAM;  Surgeon: Franchot Gallo, MD;  Location: [facility];  Service: Urology;  Laterality: N/A;  . DEBRIDEMENT AND CLOSURE WOUND Left 03/04/2019   Procedure: Excision of hip wound with placement of Acell;  Surgeon: [name], DO;  Location: [facility];  Service: Plastics;  Laterality: Left;  . ESOPHAGOGASTRODUODENOSCOPY N/A 08/14/2018   Procedure: ESOPHAGOGASTRODUODENOSCOPY (EGD);  Surgeon: Georganna Skeans, MD;  Location: [place] [facility];  Service: General;  Laterality: N/A;  bedside  . FACIAL RECONSTRUCTION SURGERY     X 2--once as a teenager and second time in his 43's  . HARDWARE REMOVAL Left 03/04/2019   Procedure: Left Hip Hardware Removal;  Surgeon: Shona Needles, MD;  Location: [place];  Service: Orthopedics;  Laterality: Left;  . HIP PINNING,CANNULATED Left 07/12/2018   Procedure: CANNULATED HIP PINNING;  Surgeon: Shona Needles, MD;  Location: [place];  Service: Orthopedics;  Laterality: Left;  . HIP SURGERY    . I&D EXTREMITY Left 07/25/2018   Procedure: Debridement of buttock, scrotum and left leg, placement of acell and  vac;  Surgeon: [name], DO;  Location: [place];  Service: Plastics;  Laterality: Left;  . I&D EXTREMITY N/A 08/06/2018   Procedure: Debridement of buttock, scrotum and left leg;  Surgeon: [name], DO;  Location: [place];  Service: Plastics;  Laterality: N/A;  . I&D EXTREMITY N/A 08/13/2018   Procedure: Debridement of buttock, scrotum and left leg, placement of acell and vac;  Surgeon: [name], DO;  Location: [place];  Service: Plastics;  Laterality: N/A;  90 min, please  . INCISION AND DRAINAGE OF WOUND N/A 07/18/2018   Procedure: Debridement of left leg, buttocks and scrotal wound with placement of acell and Flexiseal;  Surgeon: [name], DO;  Location: [place];  Service: Plastics;  Laterality: N/A;  . INCISION AND DRAINAGE OF WOUND Left 08/29/2018   Procedure: Debridement of buttock, scrotum and left leg, placement of acell and vac;  Surgeon: [name], DO;  Location: [place];  Service: Plastics;  Laterality: Left;  75 min, please  . INCISION AND DRAINAGE OF WOUND Bilateral 10/23/2018   Procedure: DEBRIDEMENT OF BUTTOCK,SCROTUM, AND LEG WOUNDS WITH PLACEMENT OF ACELL- BILATERAL 90 MIN;  Surgeon: [name], DO;  Location: [place];  Service: Plastics;  Laterality: Bilateral;  . IR ANGIOGRAM PELVIS SELECTIVE OR SUPRASELECTIVE  07/10/2018  . IR ANGIOGRAM PELVIS SELECTIVE OR SUPRASELECTIVE  07/10/2018  . IR ANGIOGRAM SELECTIVE EACH ADDITIONAL VESSEL  07/10/2018  . IR EMBO ART  VEN HEMORR LYMPH EXTRAV  INC GUIDE ROADMAPPING  07/10/2018  . IR US GUIDE BX ASP/DRAIN  07/10/2018  . IR US GUIDE VASC ACCESS RIGHT  07/10/2018  . IR VENO/EXT/UNI LEFT  07/10/2018  . IRRIGATION AND DEBRIDEMENT OF WOUND WITH SPLIT THICKNESS SKIN GRAFT Left 09/19/2018   Procedure: Debridement of gluteal wound with placement of acell to left leg.;  Surgeon: [name], DO;  Location: [place];  Service: Plastics;  Laterality: Left;  2.5 hours, please  . LAPAROTOMY N/A 07/12/2018    Procedure: EXPLORATORY LAPAROTOMY;  Surgeon: Georganna Skeans, MD;  Location: [place];  Service: General;  Laterality: N/A;  . LAPAROTOMY N/A 07/15/2018   Procedure: WOUND EXPLORATION; CLOSURE OF ABDOMEN;  Surgeon: Georganna Skeans, MD;  Location: [place];  Service: General;  Laterality: N/A;  . LAPAROTOMY  07/10/2018   Procedure: Exploratory Laparotomy;  Surgeon: Clovis Riley, MD;  Location: [place];  Service: General;;  . PEG PLACEMENT N/A 08/14/2018   Procedure: PERCUTANEOUS ENDOSCOPIC GASTROSTOMY (PEG) PLACEMENT;  Surgeon: Georganna Skeans, MD;  Location: [place];  Service: General;  Laterality: N/A;  . PERCUTANEOUS TRACHEOSTOMY N/A 08/02/2018   Procedure: PERCUTANEOUS TRACHEOSTOMY;  Surgeon: Georganna Skeans, MD;  Location: [facility];  Service: General;  Laterality: N/A;  . RADIOLOGY WITH ANESTHESIA N/A 07/10/2018   Procedure: IR WITH ANESTHESIA;  Surgeon: Sandi Mariscal, MD;  Location: [place];  Service: Radiology;  Laterality: N/A;  . RADIOLOGY WITH ANESTHESIA Right 07/10/2018   Procedure: Ir With Anesthesia;  Surgeon: Sandi Mariscal, MD;  Location: [place];  Service: Radiology;  Laterality: Right;  . SCROTAL EXPLORATION N/A  07/15/2018   Procedure: SCROTUM DEBRIDEMENT;  Surgeon: Franchot Gallo, MD;  Location: [place];  Service: Urology;  Laterality: N/A;  . SHOULDER SURGERY    . SKIN SPLIT GRAFT Right 09/19/2018   Procedure: Skin Graft Split Thickness;  Surgeon: [name], DO;  Location: [place];  Service: Plastics;  Laterality: Right;  . SKIN SPLIT GRAFT N/A 10/03/2018   Procedure: Split thickness skin graft to gluteal area with acell placement;  Surgeon: [name], DO;  Location: [facility];  Service: Plastics;  Laterality: N/A;  3 hours, please  . VACUUM ASSISTED CLOSURE CHANGE N/A 07/12/2018   Procedure: ABDOMINAL VACUUM ASSISTED CLOSURE CHANGE and abdominal washout;  Surgeon: Georganna Skeans, MD;  Location: [facility];  Service: General;  Laterality: N/A;  . WOUND DEBRIDEMENT Left 07/23/2018    Procedure: DEBRIDEMENT LEFT BUTTOCK  WOUND;  Surgeon: Georganna Skeans, MD;  Location: [facility];  Service: General;  Laterality: Left;  . WOUND EXPLORATION Left 07/10/2018   Procedure: WOUND EXPLORATION LEFT GROIN;  Surgeon: Clovis Riley, MD;  Location: [place];  Service: General;  Laterality: Left;        Home Medications    Prior to Admission medications   Medication Sig Start Date End Date Taking? Authorizing Provider  acetaminophen (TYLENOL) 325 MG tablet Take 2 tablets (650 mg total) by mouth every 6 (six) hours as needed for mild pain (or Fever >/= 101). 03/12/19  Yes Buriev, Arie Sabina, MD  Chlorhexidine Gluconate Cloth 2 % PADS Apply 6 each topically daily at 6 (six) AM. 03/13/19  Yes Buriev, Arie Sabina, MD  citalopram (CELEXA) 20 MG tablet Take 1 tablet (20 mg total) by mouth daily. For mood/anxiety 01/29/19 01/29/20 Yes Lovorn, Jinny Blossom, MD  clonazePAM (KLONOPIN) 0.5 MG tablet Take 0.5 tablets (0.25 mg total) by mouth 3 (three) times daily as needed (anxiety). 02/26/19  Yes Lovorn, Jinny Blossom, MD  fentaNYL (DURAGESIC) 50 MCG/HR Place 1 patch onto the skin every other day. 03/26/19 04/25/19 Yes Lovorn, Jinny Blossom, MD  gabapentin (NEURONTIN) 300 MG capsule TAKE 1 CAPSULE BY MOUTH THREE TIMES A DAY Patient taking differently: Take 300 mg by mouth 3 (three) times daily.  02/12/19  Yes Lovorn, Jinny Blossom, MD  guaiFENesin (MUCINEX) 600 MG 12 hr tablet Take 2 tablets (1,200 mg total) by mouth 2 (two) times daily. 03/26/19  Yes Vivi Barrack, MD  methocarbamol (ROBAXIN) 500 MG tablet TAKE 1 TABLET BY MOUTH EVERY 6 HOURS AS NEEDED FOR MUSCLE SPASMS Patient taking differently: Take 500 mg by mouth every 6 (six) hours as needed for muscle spasms.  03/28/19  Yes Lovorn, Jinny Blossom, MD  metroNIDAZOLE (FLAGYL) 500 MG tablet Take 1 tablet (500 mg total) by mouth 3 (three) times daily. 03/12/19 04/18/19 Yes Buriev, Arie Sabina, MD  Multiple Vitamin (MULTIVITAMIN WITH MINERALS) TABS tablet Take 1 tablet by mouth daily. 03/13/19  Yes  Buriev, Arie Sabina, MD  omeprazole (PRILOSEC) 20 MG capsule Take 20 mg by mouth daily.   Yes [provider]  oxyCODONE-acetaminophen (PERCOCET) 5-325 MG tablet Take 1 tablet by mouth every 4 (four) hours as needed for severe pain. Fill as of 8/262020- thank you 03/21/19 03/20/20 Yes Lovorn, Jinny Blossom, MD  polyethylene glycol (MIRALAX / GLYCOLAX) 17 g packet MIX AND TAKE 17 G BY MOUTH DAILY. Patient taking differently: Take 17 g by mouth daily.  02/12/19  Yes Bayard Hugger, NP  prochlorperazine (COMPAZINE) 5 MG tablet Take 1-2 tablets (5-10 mg total) by mouth every 6 (six) hours as needed for  nausea. 12/12/18  Yes Love, Ivan Anchors, PA-C  albuterol (VENTOLIN HFA) 108 (90 Base) MCG/ACT inhaler Inhale 2 puffs into the lungs every 6 (six) hours as needed for wheezing or shortness of breath. 03/26/19   Vivi Barrack, MD  Amino Acids-Protein Hydrolys (FEEDING SUPPLEMENT, PRO-STAT SUGAR FREE 64,) LIQD Take 30 mLs by mouth 3 (three) times daily with meals. 03/26/19   Vivi Barrack, MD  ceFEPime (MAXIPIME) IVPB Inject 2 g into the vein every 8 (eight) hours. Indication:  Osteomyelitis/Wound infection Last Day of Therapy:  04/14/2019 Labs - Once weekly:  CBC/D and BMP, Labs - Every other week:  ESR and CRP 03/12/19 04/18/19  Buriev, Arie Sabina, MD  [employer] MELATONIN 3 MG TABS TAKE 1 TABLET (3 MG TOTAL) BY MOUTH AT BEDTIME. Patient taking differently: Take 3 mg by mouth at bedtime.  02/12/19   Lovorn, Jinny Blossom, MD  ibuprofen (ADVIL) 400 MG tablet Take 1 tablet (400 mg total) by mouth 4 (four) times daily for 7 days. 03/30/19 04/06/19  Cassadee Vanzandt, Corene Cornea, MD  QUEtiapine (SEROQUEL) 50 MG tablet Take 1 tablet (50 mg total) by mouth at bedtime. 02/26/19   Lovorn, Jinny Blossom, MD  senna-docusate (SENOKOT-S) 8.6-50 MG tablet Take 2 tablets by mouth at bedtime. 12/12/18   Love, Ivan Anchors, PA-C  tamsulosin (FLOMAX) 0.4 MG CAPS capsule Take 1 capsule (0.4 mg total) by mouth daily. 03/30/19   Kashae Carstens, Corene Cornea, MD    Family History Family  History  Problem Relation Age of Onset  . Breast cancer Mother        with mets to the bones    Social History Social History   Tobacco Use  . Smoking status: Former Smoker    Packs/day: 1.00    Years: 20.00    Pack years: 20.00    Types: Cigarettes    Quit date: 06/15/2018    Years since quitting: 0.7  . Smokeless tobacco: Never Used  Substance Use Topics  . Alcohol use: Never    Frequency: Never    Comment: "Every now and then"  . Drug use: Never     Allergies   Patient has no known allergies.   Review of Systems Review of Systems  Respiratory: Negative for shortness of breath.   Cardiovascular: Negative for chest pain.  Gastrointestinal: Positive for abdominal pain, nausea and vomiting. Negative for abdominal distention, anal bleeding, blood in stool, constipation and diarrhea.  Neurological: Negative for headaches.  All other systems reviewed and are negative.    Physical Exam Updated Vital Signs BP 109/73   Pulse 74   Temp 98.3 F (36.8 C) (Oral)   Resp 16   SpO2 94%   Physical Exam Vitals signs and nursing note reviewed.  Constitutional:      Appearance: He is well-developed.  HENT:     Head: Normocephalic and atraumatic.     Mouth/Throat:     Mouth: Mucous membranes are dry.     Pharynx: Oropharynx is clear.  Eyes:     Conjunctiva/sclera: Conjunctivae normal.  Neck:     Musculoskeletal: Normal range of motion.  Cardiovascular:     Rate and Rhythm: Normal rate.  Pulmonary:     Effort: Pulmonary effort is normal. No respiratory distress.  Abdominal:     General: There is no distension.  Musculoskeletal: Normal range of motion.        General: No swelling or tenderness.  Skin:    General: Skin is warm and dry.  Neurological:  General: No focal deficit present.     Mental Status: He is alert.    ED Treatments / Results  Labs (all labs ordered are listed, but only abnormal results are displayed) Labs Reviewed  CBC WITH  DIFFERENTIAL/PLATELET - Abnormal; Notable for the following components:      Result Value   WBC 11.1 (*)    RBC 3.90 (*)    Hemoglobin 12.4 (*)    MCV 104.1 (*)    Neutro Abs 9.4 (*)    Abs Immature Granulocytes 0.10 (*)    All other components within normal limits  COMPREHENSIVE METABOLIC PANEL - Abnormal; Notable for the following components:   Potassium 3.3 (*)    CO2 21 (*)    Glucose, Bld 129 (*)    Albumin 3.1 (*)    All other components within normal limits  LACTIC ACID, PLASMA - Abnormal; Notable for the following components:   Lactic Acid, Venous 3.4 (*)    All other components within normal limits  URINALYSIS, ROUTINE W REFLEX MICROSCOPIC - Abnormal; Notable for the following components:   APPearance HAZY (*)    Specific Gravity, Urine 1.031 (*)    Hgb urine dipstick SMALL (*)    Leukocytes,Ua SMALL (*)    Bacteria, UA RARE (*)    All other components within normal limits  CULTURE, BLOOD (ROUTINE X 2)  CULTURE, BLOOD (ROUTINE X 2)  URINE CULTURE  LACTIC ACID, PLASMA    EKG None  Radiology Ct Abdomen Pelvis W Contrast  Result Date: 03/30/2019 CLINICAL DATA:  Abdominal pain, fever, question of abscess EXAM: CT ABDOMEN AND PELVIS WITH CONTRAST TECHNIQUE: Multidetector CT imaging of the abdomen and pelvis was performed using the standard protocol following bolus administration of intravenous contrast. CONTRAST:  148m OMNIPAQUE IOHEXOL 300 MG/ML  SOLN COMPARISON:  March 07, 2019 FINDINGS: Lower chest: The visualized heart size within normal limits. No pericardial fluid/thickening. No hiatal hernia. Again noted is left pleural based nodularity and thickening as on the prior exam. Hepatobiliary: The liver is normal in density without focal abnormality.The main portal vein is patent. No evidence of calcified gallstones, gallbladder wall thickening or biliary dilatation. Pancreas: Unremarkable. No pancreatic ductal dilatation or surrounding inflammatory changes. Spleen:  Normal in size without focal abnormality. Adrenals/Urinary Tract: Both adrenal glands appear normal. There is new left moderate pelvicaliectasis and ureterectasis down to the level of the distal ureter where there is a 5 mm calculus best seen on series 2, image 71. There remains mild right pelviectasis and ureterectasis. There is a 4 mm calculus seen in the upper pole of the right kidney. The bladder is decompressed with a suprapubic catheter. Stomach/Bowel: The stomach, small bowel, and colon are normal in appearance. No inflammatory changes, wall thickening, or obstructive findings. Vascular/Lymphatic: There are no enlarged mesenteric, retroperitoneal, or pelvic lymph nodes. Embolization coil seen within the right lower pelvis. Scattered atherosclerosis is noted. Reproductive: The prostate is unremarkable. Other: Again noted is presacral soft tissue swelling. Musculoskeletal: Extensive bilateral pelvic fractures are again noted with screw fixation across the right ilium and right femur. There is partially healed fractures seen bilaterally involving the bilateral pubic rami, sacrum, and left iliac wing. There is large amount of heterotopic ossification overlying the left posterior iliac wing as on the prior exam. Again noted is postoperative changes overlying the left ilium with a large postsurgical defect overlying the left iliac wing. No new focal fluid collection or subcutaneous emphysema is identified within this region. IMPRESSION: 1. New left  moderate hydronephrosis with a distal left ureteral 5 mm calculus. 2. Mild right hydronephrosis. The previously seen right UVJ stone is no longer identified. 3. Non-obstructing 4 mm right upper pole renal calculi. 4. Multiple bilateral healing fractures with surgical fixation of the left ilium and proximal femur. 5. Large postsurgical defect overlying the posterior left iliac wing. No definite new fluid collection or subcutaneous emphysema noted. Electronically Signed    By: Prudencio Pair M.D.   On: 03/30/2019 03:15   Dg Chest Portable 1 View  Result Date: 03/30/2019 CLINICAL DATA:  Initial evaluation for acute cough, shortness of breath. History of smoking. EXAM: PORTABLE CHEST 1 VIEW COMPARISON:  Prior radiograph from 02/28/2019. FINDINGS: Cardiac and mediastinal silhouettes are stable in size and contour, and remain within normal limits. Left-sided PICC catheter in place with tip overlying the distal SVC. Lungs are mildly hypoinflated. No focal infiltrates. Mild diffuse bronchitic changes. No edema or visible effusion. No pneumothorax. No acute osseous finding. IMPRESSION: 1. Mild diffuse bronchitic changes, which could reflect sequelae of acute bronchiolitis or changes related to smoking. No focal infiltrates to suggest pneumonia. 2. Left-sided PICC catheter in place with tip overlying the distal SVC. Electronically Signed   By: Jeannine Boga M.D.   On: 03/30/2019 01:55    Procedures Procedures (including critical care time)  Medications Ordered in ED Medications  ceFEPIme (MAXIPIME) 2 g in sodium chloride 0.9 % 100 mL IVPB (0 g Intravenous Stopped 03/30/19 0233)  HYDROmorphone (DILAUDID) injection 1 mg (1 mg Intravenous Given 03/30/19 0118)  lactated ringers bolus 1,000 mL (0 mLs Intravenous Stopped 03/30/19 0343)  ondansetron (ZOFRAN) injection 4 mg (4 mg Intravenous Given 03/30/19 0117)  iohexol (OMNIPAQUE) 300 MG/ML solution 100 mL (100 mLs Intravenous Contrast Given 03/30/19 0242)  lactated ringers bolus 1,000 mL (0 mLs Intravenous Stopped 03/30/19 0520)  ketorolac (TORADOL) 30 MG/ML injection 15 mg (15 mg Intravenous Given 03/30/19 0342)     Initial Impression / Assessment and Plan / ED Course  I have reviewed the triage vital signs and the nursing notes.  Pertinent labs & imaging results that were available during my care of the patient were reviewed by me and considered in my medical decision making (see chart for details).   Ct done to  eval for worsenign infection, new fracture, bowel obstruction and found to have obstructive nepholithiasis. No significant AKI or obvious UTI. Pain controlled. Tolerating PO. Appears stable for dc w/ urology follow up.   Final Clinical Impressions(s) / ED Diagnoses   Final diagnoses:  Kidney stone    ED Discharge Orders         Ordered    tamsulosin (FLOMAX) 0.4 MG CAPS capsule  Daily     03/30/19 0520    ibuprofen (ADVIL) 400 MG tablet  4 times daily     03/30/19 0520           Teniyah Seivert, Corene Cornea, MD 03/30/19 [id number]

## 2019-03-30 NOTE — ED Notes (Addendum)
[facility] called  

## 2019-03-30 NOTE — ED Triage Notes (Signed)
Pt BIB [facility] and presents with left hip pain.  Pt had a traumatic injury to the hip earlier this year.  Two weeks ago he had screws taken out but he has been having pain issues.  Pt had pain at 16:00, took medication, and went to sleep.  Pt woke up in severe pain.  Pt has a PICC line on his left arm.  Pt a/o x 4.

## 2019-04-04 ENCOUNTER — Ambulatory Visit: Payer: Self-pay | Admitting: *Deleted

## 2019-04-04 ENCOUNTER — Telehealth: Payer: Self-pay

## 2019-04-04 ENCOUNTER — Encounter (HOSPITAL_COMMUNITY): Payer: Self-pay

## 2019-04-04 ENCOUNTER — Other Ambulatory Visit: Payer: Self-pay

## 2019-04-04 ENCOUNTER — Ambulatory Visit: Payer: BC Managed Care – PPO | Admitting: Internal Medicine

## 2019-04-04 ENCOUNTER — Inpatient Hospital Stay (HOSPITAL_COMMUNITY)
Admission: EM | Admit: 2019-04-04 | Discharge: 2019-04-06 | DRG: 693 | Disposition: A | Discharge status: Home Health | Payer: BC Managed Care – PPO | Attending: Internal Medicine | Admitting: Internal Medicine

## 2019-04-04 DIAGNOSIS — Z79899 Other long term (current) drug therapy: Secondary | ICD-10-CM | POA: Diagnosis not present

## 2019-04-04 DIAGNOSIS — M8668 Other chronic osteomyelitis, other site: Secondary | ICD-10-CM | POA: Diagnosis present

## 2019-04-04 DIAGNOSIS — Z20828 Contact with and (suspected) exposure to other viral communicable diseases: Secondary | ICD-10-CM | POA: Diagnosis present

## 2019-04-04 DIAGNOSIS — N138 Other obstructive and reflux uropathy: Secondary | ICD-10-CM | POA: Diagnosis present

## 2019-04-04 DIAGNOSIS — Z681 Body mass index (BMI) 19 or less, adult: Secondary | ICD-10-CM

## 2019-04-04 DIAGNOSIS — F419 Anxiety disorder, unspecified: Secondary | ICD-10-CM | POA: Diagnosis present

## 2019-04-04 DIAGNOSIS — Z9359 Other cystostomy status: Secondary | ICD-10-CM

## 2019-04-04 DIAGNOSIS — E43 Unspecified severe protein-calorie malnutrition: Secondary | ICD-10-CM | POA: Diagnosis present

## 2019-04-04 DIAGNOSIS — R112 Nausea with vomiting, unspecified: Secondary | ICD-10-CM

## 2019-04-04 DIAGNOSIS — F329 Major depressive disorder, single episode, unspecified: Secondary | ICD-10-CM | POA: Diagnosis present

## 2019-04-04 DIAGNOSIS — G894 Chronic pain syndrome: Secondary | ICD-10-CM | POA: Diagnosis present

## 2019-04-04 DIAGNOSIS — R52 Pain, unspecified: Secondary | ICD-10-CM

## 2019-04-04 DIAGNOSIS — M86652 Other chronic osteomyelitis, left thigh: Secondary | ICD-10-CM | POA: Diagnosis present

## 2019-04-04 DIAGNOSIS — K219 Gastro-esophageal reflux disease without esophagitis: Secondary | ICD-10-CM | POA: Diagnosis present

## 2019-04-04 DIAGNOSIS — N132 Hydronephrosis with renal and ureteral calculous obstruction: Principal | ICD-10-CM | POA: Diagnosis present

## 2019-04-04 DIAGNOSIS — Z933 Colostomy status: Secondary | ICD-10-CM

## 2019-04-04 DIAGNOSIS — Z87891 Personal history of nicotine dependence: Secondary | ICD-10-CM

## 2019-04-04 DIAGNOSIS — Z79890 Hormone replacement therapy (postmenopausal): Secondary | ICD-10-CM | POA: Diagnosis not present

## 2019-04-04 DIAGNOSIS — N201 Calculus of ureter: Secondary | ICD-10-CM

## 2019-04-04 HISTORY — DX: Personal history of urinary calculi: Z87.442

## 2019-04-04 LAB — COMPREHENSIVE METABOLIC PANEL
ALT: 19 U/L (ref 0–44)
AST: 30 U/L (ref 15–41)
Albumin: 3 g/dL — ABNORMAL LOW (ref 3.5–5.0)
Alkaline Phosphatase: 102 U/L (ref 38–126)
Anion gap: 13 (ref 5–15)
BUN: 9 mg/dL (ref 6–20)
CO2: 22 mmol/L (ref 22–32)
Calcium: 10.3 mg/dL (ref 8.9–10.3)
Chloride: 107 mmol/L (ref 98–111)
Creatinine, Ser: 0.59 mg/dL — ABNORMAL LOW (ref 0.61–1.24)
GFR calc Af Amer: >60 mL/min (ref >60)
GFR calc non Af Amer: >60 mL/min (ref >60)
Glucose, Bld: 125 mg/dL — ABNORMAL HIGH (ref 70–99)
Potassium: 3.8 mmol/L (ref 3.5–5.1)
Sodium: 142 mmol/L (ref 135–145)
Total Bilirubin: 0.6 mg/dL (ref 0.3–1.2)
Total Protein: 7.9 g/dL (ref 6.5–8.1)

## 2019-04-04 LAB — CULTURE, BLOOD (ROUTINE X 2)
Culture: NO GROWTH
Culture: NO GROWTH
Special Requests: ADEQUATE

## 2019-04-04 LAB — URINALYSIS, ROUTINE W REFLEX MICROSCOPIC
Bacteria, UA: NONE SEEN
Bilirubin Urine: NEGATIVE
Glucose, UA: NEGATIVE mg/dL
Ketones, ur: 5 mg/dL — AB
Nitrite: NEGATIVE
Protein, ur: 30 mg/dL — AB
Specific Gravity, Urine: 1.023 (ref 1.005–1.030)
WBC, UA: >50 WBC/hpf — ABNORMAL HIGH (ref 0–5)
pH: 5 (ref 5.0–8.0)

## 2019-04-04 LAB — CBC WITH DIFFERENTIAL/PLATELET
Abs Immature Granulocytes: 0.01 10*3/uL (ref 0.00–0.07)
Basophils Absolute: 0 10*3/uL (ref 0.0–0.1)
Basophils Relative: 1 %
Eosinophils Absolute: 0.1 10*3/uL (ref 0.0–0.5)
Eosinophils Relative: 2 %
HCT: 44.4 % (ref 39.0–52.0)
Hemoglobin: 13.8 g/dL (ref 13.0–17.0)
Immature Granulocytes: 0 %
Lymphocytes Relative: 17 %
Lymphs Abs: 0.9 10*3/uL (ref 0.7–4.0)
MCH: 32.5 pg (ref 26.0–34.0)
MCHC: 31.1 g/dL (ref 30.0–36.0)
MCV: 104.7 fL — ABNORMAL HIGH (ref 80.0–100.0)
Monocytes Absolute: 0.2 10*3/uL (ref 0.1–1.0)
Monocytes Relative: 4 %
Neutro Abs: 4 10*3/uL (ref 1.7–7.7)
Neutrophils Relative %: 76 %
Platelets: 286 10*3/uL (ref 150–400)
RBC: 4.24 MIL/uL (ref 4.22–5.81)
RDW: 14.4 % (ref 11.5–15.5)
WBC: 5.3 10*3/uL (ref 4.0–10.5)
nRBC: 0 % (ref 0.0–0.2)

## 2019-04-04 MED ORDER — GUAIFENESIN ER 600 MG PO TB12
1200.0000 mg | ORAL_TABLET | Freq: Two times a day (BID) | ORAL | Status: DC
  Administered 2019-04-05 – 2019-04-06 (×4): 1200 mg via ORAL
  Filled 2019-04-04 (×5): qty 2

## 2019-04-04 MED ORDER — HYDROMORPHONE HCL 1 MG/ML IJ SOLN
0.5000 mg | Freq: Once | INTRAMUSCULAR | Status: AC
  Administered 2019-04-04: 0.5 mg via INTRAVENOUS
  Filled 2019-04-04: qty 1

## 2019-04-04 MED ORDER — POTASSIUM CHLORIDE CRYS ER 20 MEQ PO TBCR
30.0000 meq | EXTENDED_RELEASE_TABLET | Freq: Once | ORAL | Status: AC
  Administered 2019-04-05: 30 meq via ORAL
  Filled 2019-04-04: qty 1

## 2019-04-04 MED ORDER — TAMSULOSIN HCL 0.4 MG PO CAPS
0.4000 mg | ORAL_CAPSULE | Freq: Every day | ORAL | Status: DC
  Administered 2019-04-05 – 2019-04-06 (×2): 0.4 mg via ORAL
  Filled 2019-04-04 (×2): qty 1

## 2019-04-04 MED ORDER — CLONAZEPAM 0.25 MG PO TBDP: 0.2500 mg | ORAL_TABLET | Freq: Three times a day (TID) | ORAL | Status: DC | PRN

## 2019-04-04 MED ORDER — METRONIDAZOLE 500 MG PO TABS
500.0000 mg | ORAL_TABLET | Freq: Three times a day (TID) | ORAL | Status: DC
  Administered 2019-04-05 – 2019-04-06 (×6): 500 mg via ORAL
  Filled 2019-04-04 (×6): qty 1

## 2019-04-04 MED ORDER — QUETIAPINE FUMARATE 50 MG PO TABS: 50.0000 mg | ORAL_TABLET | Freq: Every day | ORAL | Status: DC

## 2019-04-04 MED ORDER — CITALOPRAM HYDROBROMIDE 10 MG PO TABS: 20.0000 mg | ORAL_TABLET | Freq: Every day | ORAL | Status: DC

## 2019-04-04 MED ORDER — SENNOSIDES-DOCUSATE SODIUM 8.6-50 MG PO TABS
2.0000 | ORAL_TABLET | Freq: Every day | ORAL | Status: DC
  Administered 2019-04-05 (×2): 2 via ORAL
  Filled 2019-04-04 (×2): qty 2

## 2019-04-04 MED ORDER — DRONABINOL 2.5 MG PO CAPS
2.5000 mg | ORAL_CAPSULE | Freq: Two times a day (BID) | ORAL | Status: DC
  Administered 2019-04-05 – 2019-04-06 (×3): 2.5 mg via ORAL
  Filled 2019-04-04 (×4): qty 1

## 2019-04-04 MED ORDER — PANTOPRAZOLE SODIUM 20 MG PO TBEC
20.0000 mg | DELAYED_RELEASE_TABLET | Freq: Every day | ORAL | Status: DC
  Administered 2019-04-05 – 2019-04-06 (×2): 20 mg via ORAL
  Filled 2019-04-04 (×3): qty 1

## 2019-04-04 MED ORDER — HYDROMORPHONE HCL 1 MG/ML IJ SOLN
1.0000 mg | INTRAMUSCULAR | Status: DC | PRN
  Administered 2019-04-05 – 2019-04-06 (×9): 1 mg via INTRAVENOUS
  Filled 2019-04-04 (×8): qty 1

## 2019-04-04 MED ORDER — SODIUM CHLORIDE 0.9 % IV BOLUS
1000.0000 mL | Freq: Once | INTRAVENOUS | Status: AC
  Administered 2019-04-04: 1000 mL via INTRAVENOUS

## 2019-04-04 MED ORDER — ACETAMINOPHEN 325 MG PO TABS
650.0000 mg | ORAL_TABLET | Freq: Four times a day (QID) | ORAL | Status: DC | PRN
  Filled 2019-04-04: qty 2

## 2019-04-04 MED ORDER — KETOROLAC TROMETHAMINE 30 MG/ML IJ SOLN
30.0000 mg | Freq: Four times a day (QID) | INTRAMUSCULAR | Status: AC | PRN
  Administered 2019-04-05 (×3): 30 mg via INTRAVENOUS
  Filled 2019-04-04 (×3): qty 1

## 2019-04-04 MED ORDER — MELATONIN 3 MG PO TABS
3.0000 mg | ORAL_TABLET | Freq: Every day | ORAL | Status: DC
  Administered 2019-04-05: 3 mg via ORAL
  Filled 2019-04-04 (×2): qty 1

## 2019-04-04 MED ORDER — GABAPENTIN 300 MG PO CAPS
300.0000 mg | ORAL_CAPSULE | Freq: Three times a day (TID) | ORAL | Status: DC
  Administered 2019-04-05 – 2019-04-06 (×5): 300 mg via ORAL
  Filled 2019-04-04 (×6): qty 1

## 2019-04-04 MED ORDER — CEFEPIME IV (FOR PTA / DISCHARGE USE ONLY): 2.0000 g | Freq: Three times a day (TID) | INTRAVENOUS | Status: DC

## 2019-04-04 MED ORDER — PROCHLORPERAZINE EDISYLATE 10 MG/2ML IJ SOLN
10.0000 mg | Freq: Four times a day (QID) | INTRAMUSCULAR | Status: DC | PRN
  Administered 2019-04-05 – 2019-04-06 (×2): 10 mg via INTRAVENOUS
  Filled 2019-04-04 (×2): qty 2

## 2019-04-04 MED ORDER — SODIUM CHLORIDE 0.9 % IV SOLN
2.0000 g | Freq: Three times a day (TID) | INTRAVENOUS | Status: DC
  Administered 2019-04-05 – 2019-04-06 (×5): 2 g via INTRAVENOUS
  Filled 2019-04-04 (×7): qty 2

## 2019-04-04 MED ORDER — ADULT MULTIVITAMIN W/MINERALS CH
1.0000 | ORAL_TABLET | Freq: Every day | ORAL | Status: DC
  Administered 2019-04-05 – 2019-04-06 (×2): 1 via ORAL
  Filled 2019-04-04 (×3): qty 1

## 2019-04-04 MED ORDER — KETOROLAC TROMETHAMINE 30 MG/ML IJ SOLN
30.0000 mg | Freq: Once | INTRAMUSCULAR | Status: AC
  Administered 2019-04-04: 30 mg via INTRAVENOUS
  Filled 2019-04-04: qty 1

## 2019-04-04 MED ORDER — ALBUTEROL SULFATE (2.5 MG/3ML) 0.083% IN NEBU: 2.5000 mg | INHALATION_SOLUTION | Freq: Four times a day (QID) | RESPIRATORY_TRACT | Status: DC | PRN

## 2019-04-04 MED ORDER — PRO-STAT SUGAR FREE PO LIQD
30.0000 mL | Freq: Three times a day (TID) | ORAL | Status: DC
  Administered 2019-04-05 – 2019-04-06 (×4): 30 mL via ORAL
  Filled 2019-04-04 (×6): qty 30

## 2019-04-04 MED ORDER — ACETAMINOPHEN 650 MG RE SUPP: 650.0000 mg | Freq: Four times a day (QID) | RECTAL | Status: DC | PRN

## 2019-04-04 MED ORDER — METHOCARBAMOL 500 MG PO TABS
500.0000 mg | ORAL_TABLET | Freq: Four times a day (QID) | ORAL | Status: DC | PRN
  Administered 2019-04-05: 500 mg via ORAL
  Filled 2019-04-04: qty 1

## 2019-04-04 MED ORDER — ONDANSETRON HCL 4 MG/2ML IJ SOLN: 4.0000 mg | Freq: Once | INTRAMUSCULAR | Status: DC

## 2019-04-04 MED ORDER — POLYETHYLENE GLYCOL 3350 17 G PO PACK
17.0000 g | PACK | Freq: Every day | ORAL | Status: DC
  Administered 2019-04-06: 17 g via ORAL
  Filled 2019-04-04: qty 1

## 2019-04-04 MED ORDER — ONDANSETRON HCL 4 MG/2ML IJ SOLN
4.0000 mg | Freq: Once | INTRAMUSCULAR | Status: AC
  Administered 2019-04-04: 4 mg via INTRAVENOUS
  Filled 2019-04-04: qty 2

## 2019-04-04 NOTE — ED Provider Notes (Signed)
Sign-out taken at shift change From Christopher Robinson, Christopher [name] pending admission call to hospitalist.    Clinical Course as of Apr 03 2244  Thu Apr 04, 2019  2217 Plan: Pt to be admitted for pain control.  Urology to see in AM with potential plan for nephrostomy tube placement.  Consult to hospitalist pending.   [HM]  2243 Discussed with Dr. Marlowe Sax who will admit.  COVID screening test pending   [HM]    Clinical Course User Index [HM] Christopher [name], Christopher Soho, Christopher [name]     Left ureteral stone  Intractable pain  Suprapubic catheter [facility])     Christopher [name], Christopher [name] 04/04/19 2246    Varney Biles, MD 04/05/19 0020

## 2019-04-04 NOTE — Telephone Encounter (Signed)
Copied from [place] [id number]. Topic: General - Other >> Apr 04, 2019  9:23 AM Leward Quan A wrote: Reason for CRM: Patient wife called to say that he have been experiencing lots of nausea since 03/31/2019 and vomiting this morning after eating and taking medications, she would like a call back from the nurse to say whether he can get something for the nausea or should go back to the ED. Ph# [phone]

## 2019-04-04 NOTE — Telephone Encounter (Signed)
FYI

## 2019-04-04 NOTE — Telephone Encounter (Signed)
Agree with recommendations. Needs evaluation if vomiting persists.  Algis Greenhouse. Jerline Pain, MD 04/04/2019 12:32 PM

## 2019-04-04 NOTE — Telephone Encounter (Signed)
See note

## 2019-04-04 NOTE — Telephone Encounter (Signed)
Had nausea with vomiting for one day last week. Went to the ED 10/17 dx kidney stone left kidney. Prescribed flomax.  Improved until yesterday, vomiting after cereal and milk breakfast yesterday once and again this morning after same breakfast. Had mid center abdominal cramping with the vomiting this morning that resolved after emesis. No blood noticed in vomit. Drinking plenty and voiding via catheter. Able to drink and keep down after vomiting episode yesterday and today. No fever/no dizziness/no CP. Is on chronic abx therapy oral and IV. Reviewed dietary changes to maintain hydration and ease of stomach discomfort (crampy and dull). Will try mylanta/maalox today. Will check for compazine prescription at the pharmacy prescribed in July 2020.  Reviewed urgent symptoms that would need immediate evaluation. Call back if no improvement or worsens. Routing to pcp for any additional advice.  Reason for Disposition . MILD or MODERATE vomiting (e.g., 1 - 5 times / day)  Answer Assessment - Initial Assessment Questions 1. VOMITING SEVERITY: "How many times have you vomited in the past 24 hours?"     - MILD:  1 - 2 times/day    - MODERATE: 3 - 5 times/day, decreased oral intake without significant weight loss or symptoms of dehydration    - SEVERE: 6 or more times/day, vomits everything or nearly everything, with significant weight loss, symptoms of dehydration      Once yesterday and once this morning after cereal with milk. 2. ONSET: "When did the vomiting begin?"     Thursday last week.  3. FLUIDS: "What fluids or food have you vomited up today?" "Have you been able to keep any fluids down?"     Cereal. 4. ABDOMINAL PAIN: "Are your having any abdominal pain?" If yes : "How bad is it and what does it feel like?" (e.g., crampy, dull, intermittent, constant)     Mid center abdominal pain, crampy ache that comes and goes. 5. DIARRHEA: "Is there any diarrhea?" If so, ask: "How many times today?"      No. Has  a colostomy, no more output than usual. 6. CONTACTS: "Is there anyone else in the family with the same symptoms?"      no 7. CAUSE: "What do you think is causing your vomiting?"     Possible kidney stone  8. HYDRATION STATUS: "Any signs of dehydration?" (e.g., dry mouth [not only dry lips], too weak to stand) "When did you last urinate?"     None. Voiding vis catheter this morning. 9. OTHER SYMPTOMS: "Do you have any other symptoms?" (e.g., fever, headache, vertigo, vomiting blood or coffee grounds, recent head injury)     none 10. PREGNANCY: "Is there any chance you are pregnant?" "When was your last menstrual period?"       na  Protocols used: [facility]

## 2019-04-04 NOTE — ED Triage Notes (Signed)
Per [facility] pt presents with N/V x2 days associated with Left hip/lower back pain. Left PICC line receiving abx's for Left hip wound and PO abx for UTI
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
## 2019-04-04 NOTE — H&P (Signed)
History and Physical    Christopher [name] BCW:[id number] DOB: [birth year] DOA: 04/04/2019  PCP: Vivi Barrack, MD Patient coming from: Home  Chief Complaint: Nausea, vomiting, hip pain  HPI: Christopher [name] is a 53 y.o. 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  Patient was seen in the ED on 10/17 for the symptoms and diagnosed with obstructive nephrolithiasis.  He was prescribed Flomax and discharged with plan to follow-up with urology.  Patient states he has been taking Flomax but continues to have vomiting and left-sided flank/pelvic pain.  No fevers or chills.  No abdominal pain.  He has not seen outpatient urology yet.  He is requesting a medication for pain.  ED Course: Afebrile.  Tachycardic and tachypneic on arrival.  Not hypotensive.  No leukocytosis.  UA with large amount of leukocytes, 11-20 RBCs, and greater than 50 WBCs. Patient received medications for pain including Dilaudid and Toradol.  Received Zofran for nausea.  In addition, 1 L normal saline bolus.    Review of Systems:  All systems reviewed and apart from history of presenting illness, are negative.  Past Medical History:  Diagnosis Date  . Acute on chronic respiratory failure with hypoxia ([place])   . Bacteremia due to Pseudomonas   . Chronic pain syndrome   . Multiple traumatic injuries     Past Surgical History:  Procedure Laterality Date  .  APPLICATION OF A-CELL OF BACK N/A 08/06/2018   Procedure: Application Of A-Cell Of Back;  Surgeon: [name], DO;  Location: [place];  Service: Plastics;  Laterality: N/A;  . APPLICATION OF A-CELL OF EXTREMITY Left 08/06/2018   Procedure: Application Of A-Cell Of Extremity;  Surgeon: [name], DO;  Location: [place];  Service: Plastics;  Laterality: Left;  . APPLICATION OF WOUND VAC  07/12/2018   Procedure: Application Of Wound Vac to the Left Thigh and Scrotum.;  Surgeon: Shona Needles, MD;  Location: [place];  Service: Orthopedics;;  . APPLICATION OF WOUND VAC  07/10/2018   Procedure: Application Of Wound Vac;  Surgeon: Clovis Riley, MD;  Location: [place];  Service: General;;  . COLOSTOMY N/A 07/23/2018   Procedure: COLOSTOMY;  Surgeon: Georganna Skeans, MD;  Location: [place];  Service: General;  Laterality: N/A;  . CYSTOSCOPY W/ URETERAL STENT PLACEMENT N/A 07/15/2018   Procedure: RETROGRADE URETHROGRAM;  Surgeon: Franchot Gallo, MD;  Location: [place];  Service: Urology;  Laterality: N/A;  . DEBRIDEMENT AND CLOSURE WOUND Left 03/04/2019   Procedure: Excision of hip wound with placement of Acell;  Surgeon: [name], DO;  Location: [place];  Service: Plastics;  Laterality: Left;  . ESOPHAGOGASTRODUODENOSCOPY N/A 08/14/2018   Procedure: ESOPHAGOGASTRODUODENOSCOPY (EGD);  Surgeon: Georganna Skeans, MD;  Location: [place];  Service: General;  Laterality: N/A;  bedside  . FACIAL RECONSTRUCTION SURGERY     X 2--once as a teenager and second time in his 9's  . HARDWARE REMOVAL Left 03/04/2019   Procedure: Left Hip Hardware Removal;  Surgeon: Shona Needles, MD;  Location: [place];  Service: Orthopedics;  Laterality: Left;  . HIP PINNING,CANNULATED Left 07/12/2018   Procedure: CANNULATED HIP PINNING;  Surgeon: Shona Needles, MD;  Location: [place];  Service: Orthopedics;  Laterality: Left;  . HIP SURGERY    . I&D EXTREMITY Left 07/25/2018   Procedure: Debridement of  buttock, scrotum and left leg, placement of acell and vac;  Surgeon: [name], DO;  Location: [place];  Service: Plastics;  Laterality: Left;  . I&D EXTREMITY N/A 08/06/2018   Procedure: Debridement of buttock, scrotum and left leg;  Surgeon: [name], DO;  Location: [place];  Service: Plastics;  Laterality: N/A;  . I&D EXTREMITY N/A 08/13/2018   Procedure: Debridement of buttock, scrotum and left leg, placement of acell and vac;  Surgeon: [name], DO;  Location: [place];  Service: Plastics;  Laterality: N/A;  90 min, please  . INCISION AND DRAINAGE OF WOUND N/A 07/18/2018   Procedure: Debridement of left leg, buttocks and scrotal wound with placement of acell and Flexiseal;  Surgeon: [name], DO;  Location: [place];  Service: Plastics;  Laterality: N/A;  . INCISION AND DRAINAGE OF WOUND Left 08/29/2018   Procedure: Debridement of buttock, scrotum and left leg, placement of acell and vac;  Surgeon: [name], DO;  Location: [place];  Service: Plastics;  Laterality: Left;  75 min, please  . INCISION AND DRAINAGE OF WOUND Bilateral 10/23/2018   Procedure: DEBRIDEMENT OF BUTTOCK,SCROTUM, AND LEG WOUNDS WITH PLACEMENT OF ACELL- BILATERAL 90 MIN;  Surgeon: [name], DO;  Location: [place];  Service: Plastics;  Laterality: Bilateral;  . IR ANGIOGRAM PELVIS SELECTIVE OR SUPRASELECTIVE  07/10/2018  . IR ANGIOGRAM PELVIS SELECTIVE OR SUPRASELECTIVE  07/10/2018  . IR ANGIOGRAM SELECTIVE EACH ADDITIONAL VESSEL  07/10/2018  . IR EMBO ART  VEN HEMORR LYMPH EXTRAV  INC GUIDE ROADMAPPING  07/10/2018  . IR US GUIDE BX ASP/DRAIN  07/10/2018  . IR US GUIDE VASC ACCESS RIGHT  07/10/2018  . IR VENO/EXT/UNI LEFT  07/10/2018  . IRRIGATION AND DEBRIDEMENT OF WOUND WITH SPLIT THICKNESS SKIN GRAFT Left 09/19/2018   Procedure: Debridement of gluteal wound with placement of acell to left leg.;  Surgeon: [name], DO;  Location: [place];  Service: Plastics;  Laterality:  Left;  2.5 hours, please  . LAPAROTOMY N/A 07/12/2018   Procedure: EXPLORATORY LAPAROTOMY;  Surgeon: Georganna Skeans, MD;  Location: [place];  Service: General;  Laterality: N/A;  . LAPAROTOMY N/A 07/15/2018   Procedure: WOUND EXPLORATION; CLOSURE OF ABDOMEN;  Surgeon: Georganna Skeans, MD;  Location: [place];  Service: General;  Laterality: N/A;  . LAPAROTOMY  07/10/2018   Procedure: Exploratory Laparotomy;  Surgeon: Clovis Riley, MD;  Location: [place];  Service: General;;  . PEG PLACEMENT N/A 08/14/2018   Procedure: PERCUTANEOUS ENDOSCOPIC GASTROSTOMY (PEG) PLACEMENT;  Surgeon: Georganna Skeans, MD;  Location: [place];  Service: General;  Laterality: N/A;  . PERCUTANEOUS TRACHEOSTOMY N/A 08/02/2018   Procedure: PERCUTANEOUS TRACHEOSTOMY;  Surgeon: Georganna Skeans, MD;  Location: [place];  Service: General;  Laterality: N/A;  . RADIOLOGY WITH ANESTHESIA N/A 07/10/2018   Procedure: IR WITH ANESTHESIA;  Surgeon: Sandi Mariscal, MD;  Location: [place];  Service: Radiology;  Laterality: N/A;  . RADIOLOGY WITH ANESTHESIA Right 07/10/2018   Procedure: Ir With Anesthesia;  Surgeon: Sandi Mariscal, MD;  Location: [place];  Service: Radiology;  Laterality: Right;  . SCROTAL EXPLORATION N/A  07/15/2018   Procedure: SCROTUM DEBRIDEMENT;  Surgeon: Franchot Gallo, MD;  Location: [place];  Service: Urology;  Laterality: N/A;  . SHOULDER SURGERY    . SKIN SPLIT GRAFT Right 09/19/2018   Procedure: Skin Graft Split Thickness;  Surgeon: [name], DO;  Location: [place];  Service: Plastics;  Laterality: Right;  . SKIN SPLIT GRAFT N/A 10/03/2018   Procedure: Split thickness skin graft to gluteal area with acell placement;  Surgeon: [name], DO;  Location: [place];  Service: Plastics;  Laterality: N/A;  3 hours, please  . VACUUM ASSISTED CLOSURE CHANGE N/A 07/12/2018   Procedure: ABDOMINAL VACUUM ASSISTED CLOSURE CHANGE and abdominal washout;  Surgeon: Georganna Skeans, MD;  Location: [place];  Service: General;   Laterality: N/A;  . WOUND DEBRIDEMENT Left 07/23/2018   Procedure: DEBRIDEMENT LEFT BUTTOCK  WOUND;  Surgeon: Georganna Skeans, MD;  Location: [place];  Service: General;  Laterality: Left;  . WOUND EXPLORATION Left 07/10/2018   Procedure: WOUND EXPLORATION LEFT GROIN;  Surgeon: Clovis Riley, MD;  Location: [place];  Service: General;  Laterality: Left;     reports that he quit smoking about 9 months ago. His smoking use included cigarettes. He has a 20.00 pack-year smoking history. He has never used smokeless tobacco. He reports that he does not drink alcohol or use drugs.  No Known Allergies  Family History  Problem Relation Age of Onset  . Breast cancer Mother        with mets to the bones    Prior to Admission medications   Medication Sig Start Date End Date Taking? Authorizing Provider  acetaminophen (TYLENOL) 325 MG tablet Take 2 tablets (650 mg total) by mouth every 6 (six) hours as needed for mild pain (or Fever >/= 101). 03/12/19  Yes Buriev, Arie Sabina, MD  albuterol (VENTOLIN HFA) 108 (90 Base) MCG/ACT inhaler Inhale 2 puffs into the lungs every 6 (six) hours as needed for wheezing or shortness of breath. 03/26/19  Yes Vivi Barrack, MD  Amino Acids-Protein Hydrolys (FEEDING SUPPLEMENT, PRO-STAT SUGAR FREE 64,) LIQD Take 30 mLs by mouth 3 (three) times daily with meals. 03/26/19  Yes Vivi Barrack, MD  ceFEPime (MAXIPIME) IVPB Inject 2 g into the vein every 8 (eight) hours. Indication:  Osteomyelitis/Wound infection Last Day of Therapy:  04/14/2019 Labs - Once weekly:  CBC/D and BMP, Labs - Every other week:  ESR and CRP 03/12/19 04/18/19 Yes Buriev, Arie Sabina, MD  citalopram (CELEXA) 20 MG tablet Take 1 tablet (20 mg total) by mouth daily. For mood/anxiety 01/29/19 01/29/20 Yes Lovorn, Jinny Blossom, MD  clonazePAM (KLONOPIN) 0.5 MG tablet Take 0.5 tablets (0.25 mg total) by mouth 3 (three) times daily as needed (anxiety). 02/26/19  Yes Lovorn, Megan, MD  [employer] MELATONIN 3 MG TABS TAKE 1  TABLET (3 MG TOTAL) BY MOUTH AT BEDTIME. Patient taking differently: Take 3 mg by mouth at bedtime.  02/12/19  Yes Lovorn, Jinny Blossom, MD  dronabinol (MARINOL) 2.5 MG capsule Take 2.5 mg by mouth 2 (two) times daily before a meal.   Yes [provider]  fentaNYL (DURAGESIC) 50 MCG/HR Place 1 patch onto the skin every other day. 03/26/19 04/25/19 Yes Lovorn, Jinny Blossom, MD  gabapentin (NEURONTIN) 300 MG capsule TAKE 1 CAPSULE BY MOUTH THREE TIMES A DAY Patient taking differently: Take 300 mg by mouth 3 (three) times daily.  02/12/19  Yes Lovorn, Jinny Blossom, MD  guaiFENesin (MUCINEX) 600 MG 12 hr tablet Take 2 tablets (1,200 mg  total) by mouth 2 (two) times daily. 03/26/19  Yes Vivi Barrack, MD  methocarbamol (ROBAXIN) 500 MG tablet TAKE 1 TABLET BY MOUTH EVERY 6 HOURS AS NEEDED FOR MUSCLE SPASMS Patient taking differently: Take 500 mg by mouth every 6 (six) hours as needed for muscle spasms.  03/28/19  Yes Lovorn, Jinny Blossom, MD  metroNIDAZOLE (FLAGYL) 500 MG tablet Take 1 tablet (500 mg total) by mouth 3 (three) times daily. 03/12/19 04/18/19 Yes Buriev, Arie Sabina, MD  Multiple Vitamin (MULTIVITAMIN WITH MINERALS) TABS tablet Take 1 tablet by mouth daily. 03/13/19  Yes Buriev, Arie Sabina, MD  omeprazole (PRILOSEC) 20 MG capsule Take 20 mg by mouth daily.   Yes [provider]  oxyCODONE-acetaminophen (PERCOCET) 5-325 MG tablet Take 1 tablet by mouth every 4 (four) hours as needed for severe pain. Fill as of 8/262020- thank you 03/21/19 03/20/20 Yes Lovorn, Jinny Blossom, MD  polyethylene glycol (MIRALAX / GLYCOLAX) 17 g packet MIX AND TAKE 17 G BY MOUTH DAILY. Patient taking differently: Take 17 g by mouth daily.  02/12/19  Yes Bayard Hugger, NP  prochlorperazine (COMPAZINE) 5 MG tablet Take 1-2 tablets (5-10 mg total) by mouth every 6 (six) hours as needed for nausea. 12/12/18  Yes Love, Ivan Anchors, PA-C  QUEtiapine (SEROQUEL) 50 MG tablet Take 1 tablet (50 mg total) by mouth at bedtime. 02/26/19  Yes Lovorn, Jinny Blossom, MD   senna-docusate (SENOKOT-S) 8.6-50 MG tablet Take 2 tablets by mouth at bedtime. 12/12/18  Yes Love, Ivan Anchors, PA-C  tamsulosin (FLOMAX) 0.4 MG CAPS capsule Take 1 capsule (0.4 mg total) by mouth daily. 03/30/19  Yes Mesner, Corene Cornea, MD    Physical Exam: Vitals:   04/04/19 2145 04/04/19 2200 04/04/19 2215 04/04/19 2300  BP: 136/79 133/86 126/86 122/85  Pulse: 81 74 78 87  Resp: _0 Temp:      TempSrc:      SpO2: 95% 97% 94% 94%  Weight:      Height:        Physical Exam  Constitutional: He is oriented to person, place, and time. He appears distressed.  Distress secondary to pain  HENT:  Head: Normocephalic.  Mouth/Throat: Oropharynx is clear and moist.  Eyes: Right eye exhibits no discharge. Left eye exhibits no discharge.  Neck: Neck supple.  Cardiovascular: Normal rate, regular rhythm and intact distal pulses.  Pulmonary/Chest: Effort normal and breath sounds normal. No respiratory distress. He has no wheezes. He has no rales.  Abdominal: Soft. Bowel sounds are normal. He exhibits no distension. There is no abdominal tenderness.  Musculoskeletal:        General: No edema.  Neurological: He is alert and oriented to person, place, and time.  Skin: Skin is warm and dry.  Left posterior hip wound site appears clean with no erythema or active drainage     Labs on Admission: I have personally reviewed following labs and imaging studies  CBC: Recent Labs  Lab 03/30/19 0128 04/04/19 1854  WBC 11.1* 5.3  NEUTROABS 9.4* 4.0  HGB 12.4* 13.8  HCT 40.6 44.4  MCV 104.1* 104.7*  PLT 306 264   Basic Metabolic Panel: Recent Labs  Lab 03/30/19 0128 04/04/19 1854  NA 140 142  K 3.3* 3.8  CL 107 107  CO2 21* 22  GLUCOSE 129* 125*  BUN 15 9  CREATININE 0.79 0.59*  CALCIUM 9.9 10.3   GFR: Estimated Creatinine Clearance: 93.5 mL/min (A) (by C-G formula based on SCr of 0.59 mg/dL (L)).  Liver Function Tests: Recent Labs  Lab 03/30/19 0128 04/04/19 1854  AST 26 30   ALT 20 19  ALKPHOS 101 102  BILITOT 0.7 0.6  PROT 7.6 7.9  ALBUMIN 3.1* 3.0*   No results for input(s): LIPASE, AMYLASE in the last 168 hours. No results for input(s): AMMONIA in the last 168 hours. Coagulation Profile: No results for input(s): INR, PROTIME in the last 168 hours. Cardiac Enzymes: No results for input(s): CKTOTAL, CKMB, CKMBINDEX, TROPONINI in the last 168 hours. BNP (last 3 results) No results for input(s): PROBNP in the last 8760 hours. HbA1C: No results for input(s): HGBA1C in the last 72 hours. CBG: No results for input(s): GLUCAP in the last 168 hours. Lipid Profile: No results for input(s): CHOL, HDL, LDLCALC, TRIG, CHOLHDL, LDLDIRECT in the last 72 hours. Thyroid Function Tests: No results for input(s): TSH, T4TOTAL, FREET4, T3FREE, THYROIDAB in the last 72 hours. Anemia Panel: No results for input(s): VITAMINB12, FOLATE, FERRITIN, TIBC, IRON, RETICCTPCT in the last 72 hours. Urine analysis:    Component Value Date/Time   COLORURINE YELLOW 04/04/2019 2200   APPEARANCEUR CLOUDY (A) 04/04/2019 2200   LABSPEC 1.023 04/04/2019 2200   PHURINE 5.0 04/04/2019 2200   GLUCOSEU NEGATIVE 04/04/2019 2200   HGBUR SMALL (A) 04/04/2019 2200   BILIRUBINUR NEGATIVE 04/04/2019 2200   KETONESUR 5 (A) 04/04/2019 2200   PROTEINUR 30 (A) 04/04/2019 2200   NITRITE NEGATIVE 04/04/2019 2200   LEUKOCYTESUR LARGE (A) 04/04/2019 2200    Radiological Exams on Admission: No results found.  EKG: Independently reviewed.  Sinus rhythm, QTC 603.  Assessment/Plan Principal Problem:   Obstructive nephropathy Active Problems:   Chronic pain syndrome   Colostomy status (HCC)   Chronic osteomyelitis of pelvis, left (HCC)   Intractable nausea and vomiting   Intractable nausea and vomiting secondary to obstructive nephrolithiasis CT done during ED visit on 10/17 showing new left moderate hydronephrosis with a distal left ureteral 5 mm calculus.  Mild right hydronephrosis.   Nonobstructing 4 mm right upper pole renal calculi.  Patient has a chronic suprapubic catheter. UA with large amount of leukocytes, 11-20 RBCs, and greater than 50 WBCs.  Afebrile and no leukocytosis.  Tachycardia resolved with fluid bolus.  No signs of sepsis at this time.  Patient is already receiving IV antibiotics for chronic osteomyelitis. -ED provider discussed the case with urology.  Not recommending repeat imaging at this time.  Urology will see the patient and he will likely require nephrostomy tube placement. -Dilaudid as needed pain, Toradol as needed -Continue Flomax -Compazine as needed nausea/vomiting -Keep n.p.o. except sips with meds at this time  Chronic left posterior hip wound/chronic osteomyelitis Underwent removal of pelvic hardware and excision of superficial pelvic bone for osteomyelitis on 9/21 by Ortho and discharged with 6 week course of IV cefepime via PICC line and oral Metronidazole (end date of antibiotics April 14, 2019). CT done 10/17 showing no definite fluid collection or subcutaneous emphysema at the postsurgical site at the posterior left iliac wing. -Continue antibiotics  Status post colostomy -Ostomy care  Chronic anxiety/depression -Continue home Klonopin -Hold home Celexa and Seroquel at this time given QT prolongation on EKG which is new compared to prior EKG.  QTc 603.  QT prolongation on EKG -Cardiac monitoring -Keep potassium above 4 and magnesium above 2 -Avoid QT prolonging drugs if possible -Repeat EKG in a.m.  GERD -Continue PPI  Chronic pain syndrome -Dilaudid PRN and Toradol PRN as above -Continue home gabapentin and Robaxin  Severe protein calorie malnutrition BMI 16.8 and albumin 3.0. -Nutrition consult -Continue home feeding supplement when no longer n.p.o.  Physical debility/ambulatory dysfunction -PT/OT evaluation -Fall precautions   DVT prophylaxis: SCDs Code Status: Full code Family Communication: No family  available. Disposition Plan: Anticipate discharge after clinical improvement. Consults called: Urology (Dr. Alyson Ingles) Admission status: It is my clinical opinion that admission to INPATIENT is reasonable and necessary in this 53 y.o. male . presenting with intractable nausea and vomiting secondary to obstructive nephrolithiasis.  Urology to see the patient and planning on placing nephrostomy tube.  Given the aforementioned, the predictability of an adverse outcome is felt to be significant. I expect that the patient will require at least 2 midnights in the hospital to treat this condition.   The medical decision making on this patient was of high complexity and the patient is at high risk for clinical deterioration, therefore this is a level 3 visit.  Shela Leff MD [facility] Pager [phone]  If 7PM-7AM, please contact night-coverage [email or web address] Password [facility]  04/04/2019, 11:31 PM

## 2019-04-04 NOTE — ED Notes (Signed)
Sent down BC x1, lactic acid

## 2019-04-05 ENCOUNTER — Encounter (HOSPITAL_COMMUNITY): Payer: Self-pay | Admitting: General Practice

## 2019-04-05 ENCOUNTER — Telehealth: Payer: Self-pay | Admitting: *Deleted

## 2019-04-05 ENCOUNTER — Inpatient Hospital Stay (HOSPITAL_COMMUNITY): Payer: BC Managed Care – PPO

## 2019-04-05 DIAGNOSIS — G894 Chronic pain syndrome: Secondary | ICD-10-CM

## 2019-04-05 DIAGNOSIS — N138 Other obstructive and reflux uropathy: Secondary | ICD-10-CM

## 2019-04-05 DIAGNOSIS — Z933 Colostomy status: Secondary | ICD-10-CM

## 2019-04-05 DIAGNOSIS — N132 Hydronephrosis with renal and ureteral calculous obstruction: Principal | ICD-10-CM

## 2019-04-05 HISTORY — PX: IR NEPHROSTOMY PLACEMENT LEFT: IMG6063

## 2019-04-05 LAB — SARS CORONAVIRUS 2 (TAT 6-24 HRS): SARS Coronavirus 2: NEGATIVE

## 2019-04-05 LAB — GLUCOSE, CAPILLARY: Glucose-Capillary: 88 mg/dL (ref 70–99)

## 2019-04-05 MED ORDER — HYDROMORPHONE HCL 1 MG/ML IJ SOLN
1.0000 mg | INTRAMUSCULAR | Status: AC
  Administered 2019-04-05: 1 mg via INTRAVENOUS
  Filled 2019-04-05: qty 1

## 2019-04-05 MED ORDER — DRONABINOL 2.5 MG PO CAPS
2.5000 mg | ORAL_CAPSULE | Freq: Once | ORAL | Status: AC
  Administered 2019-04-05: 2.5 mg via ORAL
  Filled 2019-04-05: qty 1

## 2019-04-05 MED ORDER — IOHEXOL 300 MG/ML  SOLN
50.0000 mL | Freq: Once | INTRAMUSCULAR | Status: AC | PRN
  Administered 2019-04-05: 40 mL via INTRAVENOUS

## 2019-04-05 MED ORDER — HYDROMORPHONE HCL 1 MG/ML IJ SOLN
INTRAMUSCULAR | Status: AC
  Filled 2019-04-05: qty 1

## 2019-04-05 MED ORDER — CHLORHEXIDINE GLUCONATE CLOTH 2 % EX PADS
6.0000 | MEDICATED_PAD | Freq: Every day | CUTANEOUS | Status: DC
  Administered 2019-04-06: 6 via TOPICAL

## 2019-04-05 MED ORDER — MIDAZOLAM HCL 2 MG/2ML IJ SOLN
INTRAMUSCULAR | Status: AC | PRN
  Administered 2019-04-05 (×4): 1 mg via INTRAVENOUS

## 2019-04-05 MED ORDER — FENTANYL CITRATE (PF) 100 MCG/2ML IJ SOLN
INTRAMUSCULAR | Status: AC
  Filled 2019-04-05: qty 4

## 2019-04-05 MED ORDER — KETOROLAC TROMETHAMINE 30 MG/ML IJ SOLN
30.0000 mg | Freq: Once | INTRAMUSCULAR | Status: AC
  Administered 2019-04-05: 30 mg via INTRAVENOUS
  Filled 2019-04-05: qty 1

## 2019-04-05 MED ORDER — MIDAZOLAM HCL 2 MG/2ML IJ SOLN
INTRAMUSCULAR | Status: AC
  Filled 2019-04-05: qty 4

## 2019-04-05 MED ORDER — LIDOCAINE HCL 1 % IJ SOLN
INTRAMUSCULAR | Status: AC
  Filled 2019-04-05: qty 20

## 2019-04-05 MED ORDER — IOHEXOL 300 MG/ML  SOLN
50.0000 mL | Freq: Once | INTRAMUSCULAR | Status: AC | PRN
  Administered 2019-04-05: 30 mL

## 2019-04-05 MED ORDER — LIDOCAINE HCL 1 % IJ SOLN
INTRAMUSCULAR | Status: AC | PRN
  Administered 2019-04-05: 10 mL

## 2019-04-05 MED ORDER — FENTANYL CITRATE (PF) 100 MCG/2ML IJ SOLN
INTRAMUSCULAR | Status: AC | PRN
  Administered 2019-04-05 (×4): 50 ug via INTRAVENOUS

## 2019-04-05 NOTE — Telephone Encounter (Signed)
Connie with [facility] called to let us know Mr Roupp has been admitted to [facility].

## 2019-04-05 NOTE — Sedation Documentation (Signed)
Patient is resting comfortably. 

## 2019-04-05 NOTE — Sedation Documentation (Signed)
Patient is resting

## 2019-04-05 NOTE — Consult Note (Signed)
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 follow, however his symptoms acutely worsened yesterday which brought him to the ED. He was seen by urology today and IR has been requested to place a left PCN for decompression.  Patient has been NPO since 10 pm yesterday, he is not on any blood thinning medications. Tmax 99.5, WBC 5.3, hgb 13.8, plt 286, creatinine 0.59, COVID (-).  Risks and benefits of left PCN placement was discussed with the patient including, but not limited to, infection, bleeding, significant bleeding causing loss or decrease in renal function or damage to adjacent structures.   All of the patient's questions were answered, patient is agreeable to proceed.  Consent signed and in chart.  Thank you for this interesting consult.  I greatly enjoyed meeting [name] and look forward to participating in their care.  A copy of this report was sent to the requesting provider on this date.  Electronically Signed: Joaquim Nam, PA-C 04/05/2019, 9:54 AM   I spent a total of 20 Minutes  in face to face in clinical consultation, greater than 50%  of which was counseling/coordinating care for left PCN placement.

## 2019-04-05 NOTE — Sedation Documentation (Signed)
Vital signs stable. 

## 2019-04-05 NOTE — ED Notes (Signed)
Family at bedside. 
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
## 2019-04-05 NOTE — Sedation Documentation (Signed)
Pt moaning in pain. ?

## 2019-04-05 NOTE — Sedation Documentation (Signed)
Pt prepped on table. MD in room, procedure started

## 2019-04-05 NOTE — Plan of Care (Signed)
°  Problem: Coping: °Goal: Level of anxiety will decrease °Outcome: Progressing °  °

## 2019-04-05 NOTE — Sedation Documentation (Signed)
Pt states that his pain is 10/10 back pain from kidney stone. Requests pain medication, see MAR, MD notified

## 2019-04-05 NOTE — Procedures (Signed)
Pre Procedure Dx: Left sided ureteral stone Post Procedural Dx: Same  Successful [place] and fluoroscopic guided placement of a left sided PCN with end coiled and locked within the renal pelvis. PCN connected to gravity bag.  EBL: Minimal  Complications: None immediate.  Ronny Bacon, MD Pager #: [phone]

## 2019-04-05 NOTE — Consult Note (Signed)
Urology Consult  Referring physician: Dr. Deborah Chalk Reason for referral: left ureteral calculus, intractable pain  Chief Complaint: Left flank pain  History of Present Illness: Christopher [name] is a 53yo with a hx of pelvic trauma who presented to the ER on 03/30/2019 with severe left flank pain and was diagnosed with a 18mm mid left ureteral calculus with moderate hydronephrosis. His pain was controlled and he was discharged home. He then presented to the ER last night with worsening left flank pain that was not improved with narcotics. The pain is sharp, constant, severe, and nonraditing. She has associated nausea and vomiting. He has a chronic SP tube due to his pelvic fractures. No exacerbating/alleviaitng events. No other associated symtpoms  Past Medical History:  Diagnosis Date  . Acute on chronic respiratory failure with hypoxia (HCC)   . Bacteremia due to Pseudomonas   . Chronic pain syndrome   . Multiple traumatic injuries    Past Surgical History:  Procedure Laterality Date  . APPLICATION OF A-CELL OF BACK N/A 08/06/2018   Procedure: Application Of A-Cell Of Back;  Surgeon: Peggye Form, DO;  [facility]: [facility];  Service: Plastics;  Laterality: N/A;  . APPLICATION OF A-CELL OF EXTREMITY Left 08/06/2018   Procedure: Application Of A-Cell Of Extremity;  Surgeon: Peggye Form, DO;  Location: [facility] OR;  Service: Plastics;  Laterality: Left;  . APPLICATION OF WOUND VAC  07/12/2018   Procedure: Application Of Wound Vac to the Left Thigh and Scrotum.;  Surgeon: Roby Lofts, MD;  Location: [facility] OR;  Service: Orthopedics;;  . APPLICATION OF WOUND VAC  07/10/2018   Procedure: Application Of Wound Vac;  Surgeon: Berna Bue, MD;  Location: [facility] OR;  Service: General;;  . COLOSTOMY N/A 07/23/2018   Procedure: COLOSTOMY;  Surgeon: Violeta Gelinas, MD;  Location: [facility] OR;  Service: General;  Laterality: N/A;  . CYSTOSCOPY W/ URETERAL STENT PLACEMENT N/A 07/15/2018   Procedure: RETROGRADE  URETHROGRAM;  Surgeon: Marcine Matar, MD;  Location: [facility] OR;  Service: Urology;  Laterality: N/A;  . DEBRIDEMENT AND CLOSURE WOUND Left 03/04/2019   Procedure: Excision of hip wound with placement of Acell;  Surgeon: Peggye Form, DO;  [facility];  Service: Plastics;  Laterality: Left;  . ESOPHAGOGASTRODUODENOSCOPY N/A 08/14/2018   Procedure: ESOPHAGOGASTRODUODENOSCOPY (EGD);  Surgeon: Violeta Gelinas, MD;  Location: [facility] ENDOSCOPY;  Service: General;  Laterality: N/A;  bedside  . FACIAL RECONSTRUCTION SURGERY     X 2--once as a teenager and second time in his 2's  . HARDWARE REMOVAL Left 03/04/2019   Procedure: Left Hip Hardware Removal;  Surgeon: Roby Lofts, MD;  [facility];  Service: Orthopedics;  Laterality: Left;  . HIP PINNING,CANNULATED Left 07/12/2018   Procedure: CANNULATED HIP PINNING;  Surgeon: Roby Lofts, MD;  [facility];  Service: Orthopedics;  Laterality: Left;  . HIP SURGERY    . I&D EXTREMITY Left 07/25/2018   Procedure: Debridement of buttock, scrotum and left leg, placement of acell and vac;  Surgeon: Peggye Form, DO;  [facility];  Service: Plastics;  Laterality: Left;  . I&D EXTREMITY N/A 08/06/2018   Procedure: Debridement of buttock, scrotum and left leg;  Surgeon: Peggye Form, DO;  [facility];  Service: Plastics;  Laterality: N/A;  . I&D EXTREMITY N/A 08/13/2018   Procedure: Debridement of buttock, scrotum and left leg, placement of acell and vac;  Surgeon: Peggye Form, DO;  [facility];  Service: Plastics;  Laterality:  N/A;  90 min, please  . INCISION AND DRAINAGE OF WOUND N/A 07/18/2018   Procedure: Debridement of left leg, buttocks and scrotal wound with placement of acell and Flexiseal;  Surgeon: Peggye Form, DO;  [facility];  Service: Plastics;  Laterality: N/A;  . INCISION AND DRAINAGE OF WOUND Left 08/29/2018   Procedure: Debridement of buttock, scrotum and left leg, placement of acell and  vac;  Surgeon: Peggye Form, DO;  [facility];  Service: Plastics;  Laterality: Left;  75 min, please  . INCISION AND DRAINAGE OF WOUND Bilateral 10/23/2018   Procedure: DEBRIDEMENT OF BUTTOCK,SCROTUM, AND LEG WOUNDS WITH PLACEMENT OF ACELL- BILATERAL 90 MIN;  Surgeon: Peggye Form, DO;  [facility]: [facility];  Service: Plastics;  Laterality: Bilateral;  . IR ANGIOGRAM PELVIS SELECTIVE OR SUPRASELECTIVE  07/10/2018  . IR ANGIOGRAM PELVIS SELECTIVE OR SUPRASELECTIVE  07/10/2018  . IR ANGIOGRAM SELECTIVE EACH ADDITIONAL VESSEL  07/10/2018  . IR EMBO ART  VEN HEMORR LYMPH EXTRAV  INC GUIDE ROADMAPPING  07/10/2018  . IR US GUIDE BX ASP/DRAIN  07/10/2018  . IR US GUIDE VASC ACCESS RIGHT  07/10/2018  . IR VENO/EXT/UNI LEFT  07/10/2018  . IRRIGATION AND DEBRIDEMENT OF WOUND WITH SPLIT THICKNESS SKIN GRAFT Left 09/19/2018   Procedure: Debridement of gluteal wound with placement of acell to left leg.;  Surgeon: Peggye Form, DO;  [facility];  Service: Plastics;  Laterality: Left;  2.5 hours, please  . LAPAROTOMY N/A 07/12/2018   Procedure: EXPLORATORY LAPAROTOMY;  Surgeon: Violeta Gelinas, MD;  Location: [facility] OR;  Service: General;  Laterality: N/A;  . LAPAROTOMY N/A 07/15/2018   Procedure: WOUND EXPLORATION; CLOSURE OF ABDOMEN;  Surgeon: Violeta Gelinas, MD;  Location: [facility] OR;  Service: General;  Laterality: N/A;  . LAPAROTOMY  07/10/2018   Procedure: Exploratory Laparotomy;  Surgeon: Berna Bue, MD;  Location: [facility] OR;  Service: General;;  . PEG PLACEMENT N/A 08/14/2018   Procedure: PERCUTANEOUS ENDOSCOPIC GASTROSTOMY (PEG) PLACEMENT;  Surgeon: Violeta Gelinas, MD;  Location: [facility] ENDOSCOPY;  Service: General;  Laterality: N/A;  . PERCUTANEOUS TRACHEOSTOMY N/A 08/02/2018   Procedure: PERCUTANEOUS TRACHEOSTOMY;  Surgeon: Violeta Gelinas, MD;  Location: [facility] OR;  Service: General;  Laterality: N/A;  . RADIOLOGY WITH ANESTHESIA N/A 07/10/2018   Procedure: IR WITH ANESTHESIA;  Surgeon: Simonne Come, MD;  Location: [facility] OR;  Service: Radiology;  Laterality: N/A;  . RADIOLOGY WITH ANESTHESIA Right 07/10/2018   Procedure: Ir With Anesthesia;  Surgeon: Simonne Come, MD;  Location: [facility] OR;  Service: Radiology;  Laterality: Right;  . SCROTAL EXPLORATION N/A 07/15/2018   Procedure: SCROTUM DEBRIDEMENT;  Surgeon: Marcine Matar, MD;  Location: [facility] OR;  Service: Urology;  Laterality: N/A;  . SHOULDER SURGERY    . SKIN SPLIT GRAFT Right 09/19/2018   Procedure: Skin Graft Split Thickness;  Surgeon: Peggye Form, DO;  [facility];  Service: Plastics;  Laterality: Right;  . SKIN SPLIT GRAFT N/A 10/03/2018   Procedure: Split thickness skin graft to gluteal area with acell placement;  Surgeon: Peggye Form, DO;  Location: [facility] OR;  Service: Plastics;  Laterality: N/A;  3 hours, please  . VACUUM ASSISTED CLOSURE CHANGE N/A 07/12/2018   Procedure: ABDOMINAL VACUUM ASSISTED CLOSURE CHANGE and abdominal washout;  Surgeon: Violeta Gelinas, MD;  Location: [facility] OR;  Service: General;  Laterality: N/A;  . WOUND DEBRIDEMENT Left 07/23/2018   Procedure: DEBRIDEMENT LEFT BUTTOCK  WOUND;  Surgeon: Violeta Gelinas, MD;  Location: [facility] OR;  Service: General;  Laterality: Left;  . WOUND EXPLORATION Left 07/10/2018   Procedure: WOUND EXPLORATION LEFT GROIN;  Surgeon: Clovis Riley, MD;  Location: [place];  Service: General;  Laterality: Left;    Medications: I have reviewed the patient's current medications. Allergies: No Known Allergies  Family History  Problem Relation Age of Onset  . Breast cancer Mother        with mets to the bones   Social History:  reports that he quit smoking about 9 months ago. His smoking use included cigarettes. He has a 20.00 pack-year smoking history. He has never used smokeless tobacco. He reports that he does not drink alcohol or use drugs.  Review of Systems  Genitourinary: Positive for flank pain.  All other systems reviewed and are negative.   Physical Exam:   Vital signs in last 24 hours: Temp:  [98.2 F (36.8 C)-99.5 F (37.5 C)] 99.5 F (37.5 C) (10/22 1812) Pulse Rate:  [73-120] 76 (10/22 2345) Resp:  [12-25] 13 (10/22 2345) BP: (118-168)/(78-113) 135/91 (10/22 2345) SpO2:  [93 %-98 %] 95 % (10/22 2345) Weight:  [61.2 kg] 61.2 kg (10/22 1828) Physical Exam  Constitutional: He is oriented to person, place, and time. He appears well-developed and well-nourished.  HENT:  Head: Atraumatic.  Eyes: Pupils are equal, round, and reactive to light. EOM are normal.  Neck: Normal range of motion. No thyromegaly present.  Cardiovascular: Normal rate and regular rhythm.  Respiratory: Effort normal. No respiratory distress.  GI: Soft. He exhibits no distension. There is no abdominal tenderness.  Musculoskeletal: Normal range of motion.        General: No edema.  Neurological: He is alert and oriented to person, place, and time.  Skin: Skin is warm and dry.  Psychiatric: He has a normal mood and affect. His behavior is normal. Judgment and thought content normal.    Laboratory Data:  Results for orders placed or performed during the hospital encounter of 04/04/19 (from the past 72 hour(s))  Comprehensive metabolic panel     Status: Abnormal   Collection Time: 04/04/19  6:54 PM  Result Value Ref Range   Sodium 142 135 - 145 mmol/L   Potassium 3.8 3.5 - 5.1 mmol/L   Chloride 107 98 - 111 mmol/L   CO2 22 22 - 32 mmol/L   Glucose, Bld 125 (H) 70 - 99 mg/dL   BUN 9 6 - 20 mg/dL   Creatinine, Ser 0.59 (L) 0.61 - 1.24 mg/dL   Calcium 10.3 8.9 - 10.3 mg/dL   Total Protein 7.9 6.5 - 8.1 g/dL   Albumin 3.0 (L) 3.5 - 5.0 g/dL   AST 30 15 - 41 U/L   ALT 19 0 - 44 U/L   Alkaline Phosphatase 102 38 - 126 U/L   Total Bilirubin 0.6 0.3 - 1.2 mg/dL   GFR calc non Af Amer >60 >60 mL/min   GFR calc Af Amer >60 >60 mL/min   Anion gap 13 5 - 15    Comment: Performed at [facility], [address] [place], [place] [address]  CBC with Differential      Status: Abnormal   Collection Time: 04/04/19  6:54 PM  Result Value Ref Range   WBC 5.3 4.0 - 10.5 K/uL   RBC 4.24 4.22 - 5.81 MIL/uL   Hemoglobin 13.8 13.0 - 17.0 g/dL   HCT 44.4 39.0 - 52.0 %   MCV 104.7 (H) 80.0 - 100.0 fL   MCH 32.5  26.0 - 34.0 pg   MCHC 31.1 30.0 - 36.0 g/dL   RDW 16.114.4 09.611.5 - 04.515.5 %   Platelets 286 150 - 400 K/uL   nRBC 0.0 0.0 - 0.2 %   Neutrophils Relative % 76 %   Neutro Abs 4.0 1.7 - 7.7 K/uL   Lymphocytes Relative 17 %   Lymphs Abs 0.9 0.7 - 4.0 K/uL   Monocytes Relative 4 %   Monocytes Absolute 0.2 0.1 - 1.0 K/uL   Eosinophils Relative 2 %   Eosinophils Absolute 0.1 0.0 - 0.5 K/uL   Basophils Relative 1 %   Basophils Absolute 0.0 0.0 - 0.1 K/uL   Immature Granulocytes 0 %   Abs Immature Granulocytes 0.01 0.00 - 0.07 K/uL    Comment: Performed at [facility], [address] [place], [place] [address]  Urinalysis, Routine w reflex microscopic     Status: Abnormal   Collection Time: 04/04/19 10:00 PM  Result Value Ref Range   Color, Urine YELLOW YELLOW   APPearance CLOUDY (A) CLEAR   Specific Gravity, Urine 1.023 1.005 - 1.030   pH 5.0 5.0 - 8.0   Glucose, UA NEGATIVE NEGATIVE mg/dL   Hgb urine dipstick SMALL (A) NEGATIVE   Bilirubin Urine NEGATIVE NEGATIVE   Ketones, ur 5 (A) NEGATIVE mg/dL   Protein, ur 30 (A) NEGATIVE mg/dL   Nitrite NEGATIVE NEGATIVE   Leukocytes,Ua LARGE (A) NEGATIVE   RBC / HPF 11-20 0 - 5 RBC/hpf   WBC, UA >50 (H) 0 - 5 WBC/hpf   Bacteria, UA NONE SEEN NONE SEEN   Squamous Epithelial / LPF 0-5 0 - 5   Mucus PRESENT    Hyphae Yeast PRESENT    Ca Oxalate Crys, UA PRESENT     Comment: Performed at [facility], [address] [place], [place] [address]   Recent Results (from the past 240 hour(s))  Blood culture (routine x 2)     Status: None   Collection Time: 03/30/19  1:07 AM   Specimen: BLOOD RIGHT HAND  Result Value Ref Range Status   Specimen Description   Final    BLOOD RIGHT HAND Performed  at [facility], [address] [place], [place] [address]    Special Requests   Final    BOTTLES DRAWN AEROBIC ONLY Blood Culture results may not be optimal due to an inadequate volume of blood received in culture bottles Performed at [facility], [address] [place], [place] [address]    Culture   Final    NO GROWTH 5 DAYS Performed at [facility], [address] [place], [place] [address]    Report Status 04/04/2019 FINAL  Final  Blood culture (routine x 2)     Status: None   Collection Time: 03/30/19  1:28 AM   Specimen: BLOOD  Result Value Ref Range Status   Specimen Description   Final    BLOOD PICC LINE Performed at [facility], [address] [place], [place] [address]    Special Requests   Final    BOTTLES DRAWN AEROBIC AND ANAEROBIC Blood Culture adequate volume Performed at [facility], [address] [place], [place] [address]    Culture   Final    NO GROWTH 5 DAYS Performed at [facility], [address] [place], [place] [address]    Report Status 04/04/2019 FINAL  Final   Creatinine: Recent Labs  03/30/19 0128 04/04/19 1854  CREATININE 0.79 0.59*   Baseline Creatinine: 0.6  Impression/Assessment:  53yo with left ureteral calculus and intractable pain  Plan:  1. I discussed the management of the patient ureteral calculus including continued medical expulsive therapy versus nephroureteral stent placement. The patient elects for nephroureteral stent placement followed by outpatient antegrade ureteroscopy   Wilkie Aye 04/05/2019, 12:03 AM

## 2019-04-05 NOTE — Sedation Documentation (Signed)
Nephrostomy tube placed.

## 2019-04-06 DIAGNOSIS — M86652 Other chronic osteomyelitis, left thigh: Secondary | ICD-10-CM

## 2019-04-06 DIAGNOSIS — N138 Other obstructive and reflux uropathy: Secondary | ICD-10-CM | POA: Diagnosis not present

## 2019-04-06 LAB — BASIC METABOLIC PANEL
Anion gap: 7 (ref 5–15)
BUN: 13 mg/dL (ref 6–20)
CO2: 23 mmol/L (ref 22–32)
Calcium: 9.5 mg/dL (ref 8.9–10.3)
Chloride: 109 mmol/L (ref 98–111)
Creatinine, Ser: 0.47 mg/dL — ABNORMAL LOW (ref 0.61–1.24)
GFR calc Af Amer: >60 mL/min (ref >60)
GFR calc non Af Amer: >60 mL/min (ref >60)
Glucose, Bld: 77 mg/dL (ref 70–99)
Potassium: 3.3 mmol/L — ABNORMAL LOW (ref 3.5–5.1)
Sodium: 139 mmol/L (ref 135–145)

## 2019-04-06 LAB — CBC
HCT: 36.1 % — ABNORMAL LOW (ref 39.0–52.0)
Hemoglobin: 11.4 g/dL — ABNORMAL LOW (ref 13.0–17.0)
MCH: 32.4 pg (ref 26.0–34.0)
MCHC: 31.6 g/dL (ref 30.0–36.0)
MCV: 102.6 fL — ABNORMAL HIGH (ref 80.0–100.0)
Platelets: 243 10*3/uL (ref 150–400)
RBC: 3.52 MIL/uL — ABNORMAL LOW (ref 4.22–5.81)
RDW: 14.3 % (ref 11.5–15.5)
WBC: 5.1 10*3/uL (ref 4.0–10.5)
nRBC: 0 % (ref 0.0–0.2)

## 2019-04-06 MED ORDER — HEPARIN SOD (PORK) LOCK FLUSH 100 UNIT/ML IV SOLN
250.0000 [IU] | INTRAVENOUS | Status: AC | PRN
  Administered 2019-04-06: 250 [IU]

## 2019-04-06 MED ORDER — FENTANYL 50 MCG/HR TD PT72
1.0000 | MEDICATED_PATCH | TRANSDERMAL | Status: DC
  Administered 2019-04-06: 1 via TRANSDERMAL
  Filled 2019-04-06: qty 1

## 2019-04-06 MED ORDER — POTASSIUM CHLORIDE CRYS ER 20 MEQ PO TBCR
40.0000 meq | EXTENDED_RELEASE_TABLET | Freq: Once | ORAL | Status: AC
  Administered 2019-04-06: 40 meq via ORAL
  Filled 2019-04-06: qty 2

## 2019-04-06 MED ORDER — HYDROMORPHONE HCL 1 MG/ML IJ SOLN
1.0000 mg | Freq: Once | INTRAMUSCULAR | Status: AC
  Administered 2019-04-06: 1 mg via INTRAVENOUS
  Filled 2019-04-06: qty 1

## 2019-04-06 MED ORDER — KETOROLAC TROMETHAMINE 30 MG/ML IJ SOLN
30.0000 mg | Freq: Once | INTRAMUSCULAR | Status: AC
  Administered 2019-04-06: 30 mg via INTRAVENOUS
  Filled 2019-04-06: qty 1

## 2019-04-06 NOTE — Progress Notes (Signed)
Chief Complaint: Patient was seen today for (L)PCN   Supervising Physician: Malachy Moan  Patient Status: [facility] - In-pt  Subjective: S/p (L)PCN for hydro secondary to ureteral stone Some soreness as expected  Objective: Physical Exam: BP 134/84 (BP Location: Right Arm)   Pulse 78   Temp 98.4 F (36.9 C) (Oral)   Resp 16   Ht 6\' 3"  (1.905 m)   Wt 61.2 kg   SpO2 97%   BMI 16.87 kg/m  (L)PCN intact, site clean, dry Output dark tea colored   Current Facility-Administered Medications:  .  acetaminophen (TYLENOL) tablet 650 mg, 650 mg, Oral, Q6H PRN **OR** acetaminophen (TYLENOL) suppository 650 mg, 650 mg, Rectal, Q6H PRN, , MD .  albuterol (PROVENTIL) (2.5 MG/3ML) 0.083% nebulizer solution 2.5 mg, 2.5 mg, Inhalation, Q6H PRN, John Giovanni, MD .  ceFEPIme (MAXIPIME) 2 g in sodium chloride 0.9 % 100 mL IVPB, 2 g, Intravenous, Q8H, Rathore, Vasundhra, MD, Last Rate: 200 mL/hr at 04/06/19 0357, 2 g at 04/06/19 0357 .  Chlorhexidine Gluconate Cloth 2 % PADS 6 each, 6 each, Topical, Daily, 04/08/19, MD, 6 each at 04/06/19 0946 .  clonazepam (KLONOPIN) disintegrating tablet 0.25 mg, 0.25 mg, Oral, TID PRN, 04/08/19, MD .  dronabinol (MARINOL) capsule 2.5 mg, 2.5 mg, Oral, BID AC, John Giovanni, MD, 2.5 mg at 04/06/19 0818 .  feeding supplement (PRO-STAT SUGAR FREE 64) liquid 30 mL, 30 mL, Oral, TID WC, 04/08/19, MD, 30 mL at 04/06/19 0818 .  gabapentin (NEURONTIN) capsule 300 mg, 300 mg, Oral, TID, 04/08/19, MD, 300 mg at 04/06/19 0946 .  guaiFENesin (MUCINEX) 12 hr tablet 1,200 mg, 1,200 mg, Oral, BID, 04/08/19, MD, 1,200 mg at 04/06/19 0946 .  HYDROmorphone (DILAUDID) injection 1 mg, 1 mg, Intravenous, Q4H PRN, 04/08/19, MD, 1 mg at 04/06/19 0813 .  Melatonin TABS 3 mg, 3 mg, Oral, QHS, 04/08/19, MD, 3 mg at 04/05/19 2140 .  methocarbamol (ROBAXIN) tablet 500 mg, 500 mg, Oral,  Q6H PRN, 2141, MD, 500 mg at 04/05/19 1244 .  metroNIDAZOLE (FLAGYL) tablet 500 mg, 500 mg, Oral, Q8H, 04/07/19, MD, 500 mg at 04/06/19 0617 .  multivitamin with minerals tablet 1 tablet, 1 tablet, Oral, Daily, 04/08/19, MD, 1 tablet at 04/06/19 0946 .  pantoprazole (PROTONIX) EC tablet 20 mg, 20 mg, Oral, Daily, 04/08/19, MD, 20 mg at 04/06/19 0946 .  polyethylene glycol (MIRALAX / GLYCOLAX) packet 17 g, 17 g, Oral, Daily, 04/08/19, MD, 17 g at 04/06/19 0946 .  prochlorperazine (COMPAZINE) injection 10 mg, 10 mg, Intravenous, Q6H PRN, 04/08/19, MD, 10 mg at 04/06/19 0817 .  senna-docusate (Senokot-S) tablet 2 tablet, 2 tablet, Oral, QHS, 04/08/19, MD, 2 tablet at 04/05/19 2139 .  tamsulosin (FLOMAX) capsule 0.4 mg, 0.4 mg, Oral, Daily, 2140, MD, 0.4 mg at 04/06/19 0946  Labs: CBC Recent Labs    04/04/19 1854 04/06/19 0330  WBC 5.3 5.1  HGB 13.8 11.4*  HCT 44.4 36.1*  PLT 286 243   BMET Recent Labs    04/04/19 1854 04/06/19 0330  NA 142 139  K 3.8 3.3*  CL 107 109  CO2 22 23  GLUCOSE 125* 77  BUN 9 13  CREATININE 0.59* 0.47*  CALCIUM 10.3 9.5   LFT Recent Labs    04/04/19 1854  PROT 7.9  ALBUMIN 3.0*  AST 30  ALT 19  ALKPHOS 102  BILITOT 0.6   PT/INR  No results for input(s): LABPROT, INR in the last 72 hours.   Studies/Results: Ir Nephrostomy Placement Left  Result Date: 04/05/2019 INDICATION: History of traumatic urethral transsection, now with symptomatic obstructing left-sided distal ureteral stone in the setting of a suprapubic catheter. Request made for image guided placement of a percutaneous nephrostomy catheter for diversion purposes. EXAM: 1. ULTRASOUND AND FLUOROSCOPIC GUIDED PUNCTURE OF THE LEFT RENAL COLLECTING SYSTEM 2. LEFT PERCUTANEOUS NEPHROSTOMY TUBE PLACEMENT. COMPARISON:  CT abdomen and pelvis-03/30/2019 MEDICATIONS: Patient is currently admitted to the  hospital receiving intravenous antibiotics; the antibiotic was administered in an appropriate time frame prior to skin puncture. ANESTHESIA/SEDATION: Moderate (conscious) sedation was employed during this procedure. A total of Versed 4 mg and Fentanyl 200 mcg was administered intravenously. Moderate Sedation Time: 46 minutes. The patient's level of consciousness and vital signs were monitored continuously by radiology nursing throughout the procedure under my direct supervision. CONTRAST:  70 cc Omnipaque 300 was administered intravenously 20 mL Isovue 300 administered into the collecting system FLUOROSCOPY TIME:  9 minutes, 48 seconds (48 mGy) COMPLICATIONS: None immediate. PROCEDURE: The procedure, risks, benefits, and alternatives were explained to the patient. Questions regarding the procedure were encouraged and answered. The patient understands and consents to the procedure. A timeout was performed prior to the initiation of the procedure. The left flank region was prepped with Betadine in a sterile fashion, and a sterile drape was applied covering the operative field. A sterile gown and sterile gloves were used for the procedure. Local anesthesia was provided with 1% Lidocaine with epinephrine. Ultrasound was used to localize the left kidney however demonstrated near complete resolution of previously noted left-sided pelvicaliectasis. Several attempts were made to cannulate the decompressed left renal collecting system under sonographic guidance however this ultimately proved unsuccessful. As such, contrast was administered via peripheral IV demonstrating opacification of left renal collecting system with passage of contrast through the distal aspect of the left ureter however there was suspicion for a residual nonobstructing left-sided disc cerebral stone As such, an opacified left midpole posteroinferior calyx was targeted with a 21 gauge needle. Calyceal puncture was confirmed with contrast injection. The  calyx was cannulated with a Nitrex wire which was advanced to the level the superior aspect the left ureter. Next, the access needle was exchanged for the inner catheter from the Accustick set which was advanced to the level of the mid ureter. Contrast was injected from the distal aspect the left ureter however again there was concern for a nonocclusive stone within distal aspect left ureter. As such, decision was made to place a definitive percutaneous nephrostomy catheter. Under fluoroscopic guidance, the track was dilated with the remainder of the Accustick set. A short Amplatz wire was placed to the level of the distal aspect the left ureter. The track was dilated allowing placement a 10 French percutaneous nephrostomy catheter with end coiled within the left renal pelvis. Contrast was injected and several spot radiographs were obtained in various obliquities confirming access. The catheter was secured at the skin with a Prolene retention suture and a gravity bag was placed. A dressing was placed. The patient tolerated procedure well without immediate postprocedural complication. FINDINGS: Sonographic evaluation demonstrates resolution previously noted moderate left-sided pelvicaliectasis. Given lack of any significant pelvicaliectasis, the left renal collecting system was unable to be cannulated with ultrasound guidance. As such, contrast was administered intravenously demonstrating opacification of the left renal collecting system with passage of contrast through the nondilated ureter to the level of the distal aspect the  left ureter. Contrast was noted to pass to the level of the urinary bladder however there was concern for residual nonocclusive stone. As such, a posterior midpole calyx was targeted fluoroscopically allowing advancement of catheter to the mid aspect the left ureter however again contrast injection suggested the potential for a nonocclusive distal left ureteral stone. As such, a 43 [place]  percutaneous nephrostomy was placed via the accessed posterior midpole calyx with end coiled and locked within left renal pelvis. IMPRESSION: 1. Successful ultrasound and predominantly fluoroscopic guided placement of a left-sided 10 French percutaneous nephrostomy catheter. 2. Resolution of previously noted left-sided pelvicaliectasis attributable to potential either passage of the distal left ureteral stone into the urinary bladder versus the stone no longer being occlusive in nature. Further evaluation with noncontrast CT scan the abdomen pelvis could be performed as indicated. PLAN: Recommend noncontrast CT scan the abdomen pelvis to evaluate for passage of the distal left ureteral stone. If ureteral stone has indeed migrated to the level of the urinary bladder consideration for nephrostomy catheter removal could be considered as deemed appropriate by Dr. Alyson Ingles (urology). Above findings discussed with Dr. Alyson Ingles at the time of procedure completion. Electronically Signed   By: Sandi Mariscal M.D.   On: 04/05/2019 18:04    Assessment/Plan: S/p (L)PCN for hydro from obstructing stone. Good output Plan per Urology    LOS: 2 days   I spent a total of 15 minutes in face to face in clinical consultation, greater than 50% of which was counseling/coordinating care for (L)PCN  Ascencion Dike PA-C 04/06/2019 10:37 AM

## 2019-04-06 NOTE — Progress Notes (Signed)
 Subjective/Chief Complaint:  1 - Left Ureteral Stone - left 5mm solitary distal stone by ER CT 10/17. Worsening colic and left neph tube placed 10/23 as retrograde access not feasible.  2 - Urethral Obliteration / Chronic SP Tube - manages bladder with SPT after pelvic crush injury.  Today "Christopher [name]" is stable. Had left neph tube placed yesterday by IR. No interval fevers.    Objective: Vital signs in last 24 hours: Temp:  [97.6 F (36.4 C)-98.1 F (36.7 C)] 97.8 F (36.6 C) (10/24 0459) Pulse Rate:  [60-106] 77 (10/24 0459) Resp:  [0-24] 17 (10/24 0459) BP: (111-162)/(67-108) 130/81 (10/24 0459) SpO2:  [89 %-100 %] 94 % (10/24 0459)    Intake/Output from previous day: 10/23 0701 - 10/24 0700 In: 540 [P.O.:240; IV Piggyback:300] Out: 0  Intake/Output this shift: Total I/O In: 336.7 [P.O.:240; IV Piggyback:96.7] Out: 0   General appearance: alert and cooperative Eyes: negative Nose: Nares normal. Septum midline. Mucosa normal. No drainage or sinus tenderness. Throat: lips, mucosa, and tongue normal; teeth and gums normal Neck: supple, symmetrical, trachea midline Back: symmetric, no curvature. ROM normal. No CVA tenderness., left neph tube in place with non-foul urine.  Resp: Non-labored Cardio: Nl rate GI: soft, non-tender; bowel sounds normal; no masses,  no organomegaly and LLQ colostomy patent of stool and gas Male genitalia: normal Extremities: extremities normal, atraumatic, no cyanosis or edema Pulses: 2+ and symmetric Skin: Skin color, texture, turgor normal. No rashes or lesions Neurologic: Grossly normal  LE wasting and multiple skin graft scars thigh area.   Lab Results:  Recent Labs    04/04/19 1854 04/06/19 0330  WBC 5.3 5.1  HGB 13.8 11.4*  HCT 44.4 36.1*  PLT 286 243   BMET Recent Labs    04/04/19 1854 04/06/19 0330  NA 142 139  K 3.8 3.3*  CL 107 109  CO2 22 23  GLUCOSE 125* 77  BUN 9 13  CREATININE 0.59* 0.47*  CALCIUM 10.3 9.5    PT/INR No results for input(s): LABPROT, INR in the last 72 hours. ABG No results for input(s): PHART, HCO3 in the last 72 hours.  Invalid input(s): PCO2, PO2  Studies/Results: Ir Nephrostomy Placement Left  Result Date: 04/05/2019 INDICATION: History of traumatic urethral transsection, now with symptomatic obstructing left-sided distal ureteral stone in the setting of a suprapubic catheter. Request made for image guided placement of a percutaneous nephrostomy catheter for diversion purposes. EXAM: 1. ULTRASOUND AND FLUOROSCOPIC GUIDED PUNCTURE OF THE LEFT RENAL COLLECTING SYSTEM 2. LEFT PERCUTANEOUS NEPHROSTOMY TUBE PLACEMENT. COMPARISON:  CT abdomen and pelvis-03/30/2019 MEDICATIONS: Patient is currently admitted to the hospital receiving intravenous antibiotics; the antibiotic was administered in an appropriate time frame prior to skin puncture. ANESTHESIA/SEDATION: Moderate (conscious) sedation was employed during this procedure. A total of Versed 4 mg and Fentanyl 200 mcg was administered intravenously. Moderate Sedation Time: 46 minutes. The patient's level of consciousness and vital signs were monitored continuously by radiology nursing throughout the procedure under my direct supervision. CONTRAST:  70 cc Omnipaque 300 was administered intravenously 20 mL Isovue 300 administered into the collecting system FLUOROSCOPY TIME:  9 minutes, 48 seconds (48 mGy) COMPLICATIONS: None immediate. PROCEDURE: The procedure, risks, benefits, and alternatives were explained to the patient. Questions regarding the procedure were encouraged and answered. The patient understands and consents to the procedure. A timeout was performed prior to the initiation of the procedure. The left flank region was prepped with Betadine in a sterile fashion, and a sterile drape was   applied covering the operative field. A sterile gown and sterile gloves were used for the procedure. Local anesthesia was provided with 1% Lidocaine  with epinephrine. Ultrasound was used to localize the left kidney however demonstrated near complete resolution of previously noted left-sided pelvicaliectasis. Several attempts were made to cannulate the decompressed left renal collecting system under sonographic guidance however this ultimately proved unsuccessful. As such, contrast was administered via peripheral IV demonstrating opacification of left renal collecting system with passage of contrast through the distal aspect of the left ureter however there was suspicion for a residual nonobstructing left-sided disc cerebral stone As such, an opacified left midpole posteroinferior calyx was targeted with a 21 gauge needle. Calyceal puncture was confirmed with contrast injection. The calyx was cannulated with a Nitrex wire which was advanced to the level the superior aspect the left ureter. Next, the access needle was exchanged for the inner catheter from the [place] set which was advanced to the level of the mid ureter. Contrast was injected from the distal aspect the left ureter however again there was concern for a nonocclusive stone within distal aspect left ureter. As such, decision was made to place a definitive percutaneous nephrostomy catheter. Under fluoroscopic guidance, the track was dilated with the remainder of the [place] set. A short Amplatz wire was placed to the level of the distal aspect the left ureter. The track was dilated allowing placement a 10 French percutaneous nephrostomy catheter with end coiled within the left renal pelvis. Contrast was injected and several spot radiographs were obtained in various obliquities confirming access. The catheter was secured at the skin with a Prolene retention suture and a gravity bag was placed. A dressing was placed. The patient tolerated procedure well without immediate postprocedural complication. FINDINGS: Sonographic evaluation demonstrates resolution previously noted moderate left-sided  pelvicaliectasis. Given lack of any significant pelvicaliectasis, the left renal collecting system was unable to be cannulated with ultrasound guidance. As such, contrast was administered intravenously demonstrating opacification of the left renal collecting system with passage of contrast through the nondilated ureter to the level of the distal aspect the left ureter. Contrast was noted to pass to the level of the urinary bladder however there was concern for residual nonocclusive stone. As such, a posterior midpole calyx was targeted fluoroscopically allowing advancement of catheter to the mid aspect the left ureter however again contrast injection suggested the potential for a nonocclusive distal left ureteral stone. As such, a 10 French percutaneous nephrostomy was placed via the accessed posterior midpole calyx with end coiled and locked within left renal pelvis. IMPRESSION: 1. Successful ultrasound and predominantly fluoroscopic guided placement of a left-sided 10 French percutaneous nephrostomy catheter. 2. Resolution of previously noted left-sided pelvicaliectasis attributable to potential either passage of the distal left ureteral stone into the urinary bladder versus the stone no longer being occlusive in nature. Further evaluation with noncontrast CT scan the abdomen pelvis could be performed as indicated. PLAN: Recommend noncontrast CT scan the abdomen pelvis to evaluate for passage of the distal left ureteral stone. If ureteral stone has indeed migrated to the level of the urinary bladder consideration for nephrostomy catheter removal could be considered as deemed appropriate by Dr. McKenzie (urology). Above findings discussed with Dr. McKenzie at the time of procedure completion. Electronically Signed   By: John  Watts M.D.   On: 04/05/2019 18:04    Anti-infectives: Anti-infectives (From admission, onward)   Start     Dose/Rate Route Frequency Ordered Stop   04/04/19 2359  metroNIDAZOLE (  FLAGYL)  tablet 500 mg     500 mg Oral Every 8 hours 04/04/19 2307     04/04/19 2359  ceFEPIme (MAXIPIME) 2 g in sodium chloride 0.9 % 100 mL IVPB     2 g 200 mL/hr over 30 Minutes Intravenous Every 8 hours 04/04/19 2319 04/14/19 1159   04/04/19 2315  ceFEPime (MAXIPIME) IVPB  Status:  Discontinued    Note to Pharmacy: Indication:  Osteomyelitis/Wound infection Last Day of Therapy:  04/14/2019 Labs - Once weekly:  CBC/D and BMP, Labs - Every other week:  ESR and CRP     2 g Intravenous Every 8 hours 04/04/19 2307 04/04/19 2318      Assessment/Plan:  1 - Left Ureteral Stone - now temporized with neph tube. Will need antegrade ureterosocpy in elective setting, we will arrange.   2 - Urethral Obliteration / Chronic SP Tube - continue current SPT.    Keyanni Whittinghill 04/06/2019 

## 2019-04-06 NOTE — TOC Transition Note (Signed)
Transition of Care [name]) - CM/SW Discharge Note   Patient Details  Name: Christopher [name] MRN: [id number] Date of Birth: 01/07/1966  [facility] of [facility] CM/SW Contact:  Carles Collet, RN Phone Number: 04/06/2019, 2:35 PM   Clinical Narrative:    Verified [place] services set up with [facility]. No other CM needs identified.   Final next level of care: Home w Home Health Services Barriers to Discharge: No Barriers Identified   Patient Goals and CMS Choice        Discharge Placement                       Discharge Plan and Services                          HH Arranged: PT, RN [facility] Agency: [place] ([facility]) Date HH Agency Contacted: 04/06/19 Time [place]: [phone] Representative spoke with at [place]: [place] ([place]) Interventions     Readmission Risk Interventions Readmission Risk Prevention Plan 10/16/2018  Transportation Screening Complete  Medication Review Press photographer) Complete  PCP or Specialist appointment within 3-5 days of discharge Not Complete  PCP/Specialist Appt Not Complete comments Pt discharging to [place] or [place] Not Complete  HRI or Home Care Consult Pt Refusal Comments Pt discharging to [place] Not Complete  SW Consult Not Complete Comments Pt discharging to [place] Not Applicable  Some recent data might be hidden

## 2019-04-06 NOTE — H&P (View-Only) (Signed)
Subjective/Chief Complaint:  1 - Left Ureteral Stone - left 15m solitary distal stone by ER CT 10/17. Worsening colic and left neph tube placed 10/23 as retrograde access not feasible.  2 - Urethral Obliteration / Chronic SP Tube - manages bladder with SPT after pelvic crush injury.  Today "JDmani is stable. Had left neph tube placed yesterday by IR. No interval fevers.    Objective: Vital signs in last 24 hours: Temp:  [97.6 F (36.4 C)-98.1 F (36.7 C)] 97.8 F (36.6 C) (10/24 0459) Pulse Rate:  [60-106] 77 (10/24 0459) Resp:  [0-24] 17 (10/24 0459) BP: (111-162)/(67-108) 130/81 (10/24 0459) SpO2:  [89 %-100 %] 94 % (10/24 0459)    Intake/Output from previous day: 10/23 0701 - 10/24 0700 In: 540 [P.O.:240; IV Piggyback:300] Out: 0  Intake/Output this shift: Total I/O In: 336.7 [P.O.:240; IV Piggyback:96.7] Out: 0   General appearance: alert and cooperative Eyes: negative Nose: Nares normal. Septum midline. Mucosa normal. No drainage or sinus tenderness. Throat: lips, mucosa, and tongue normal; teeth and gums normal Neck: supple, symmetrical, trachea midline Back: symmetric, no curvature. ROM normal. No CVA tenderness., left neph tube in place with non-foul urine.  Resp: Non-labored Cardio: Nl rate GI: soft, non-tender; bowel sounds normal; no masses,  no organomegaly and LLQ colostomy patent of stool and gas Male genitalia: normal Extremities: extremities normal, atraumatic, no cyanosis or edema Pulses: 2+ and symmetric Skin: Skin color, texture, turgor normal. No rashes or lesions Neurologic: Grossly normal  LE wasting and multiple skin graft scars thigh area.   Lab Results:  Recent Labs    04/04/19 1854 04/06/19 0330  WBC 5.3 5.1  HGB 13.8 11.4*  HCT 44.4 36.1*  PLT 286 243   BMET Recent Labs    04/04/19 1854 04/06/19 0330  NA 142 139  K 3.8 3.3*  CL 107 109  CO2 22 23  GLUCOSE 125* 77  BUN 9 13  CREATININE 0.59* 0.47*  CALCIUM 10.3 9.5    PT/INR No results for input(s): LABPROT, INR in the last 72 hours. ABG No results for input(s): PHART, HCO3 in the last 72 hours.  Invalid input(s): PCO2, PO2  Studies/Results: Ir Nephrostomy Placement Left  Result Date: 04/05/2019 INDICATION: History of traumatic urethral transsection, now with symptomatic obstructing left-sided distal ureteral stone in the setting of a suprapubic catheter. Request made for image guided placement of a percutaneous nephrostomy catheter for diversion purposes. EXAM: 1. ULTRASOUND AND FLUOROSCOPIC GUIDED PUNCTURE OF THE LEFT RENAL COLLECTING SYSTEM 2. LEFT PERCUTANEOUS NEPHROSTOMY TUBE PLACEMENT. COMPARISON:  CT abdomen and pelvis-03/30/2019 MEDICATIONS: Patient is currently admitted to the hospital receiving intravenous antibiotics; the antibiotic was administered in an appropriate time frame prior to skin puncture. ANESTHESIA/SEDATION: Moderate (conscious) sedation was employed during this procedure. A total of Versed 4 mg and Fentanyl 200 mcg was administered intravenously. Moderate Sedation Time: 46 minutes. The patient's level of consciousness and vital signs were monitored continuously by radiology nursing throughout the procedure under my direct supervision. CONTRAST:  70 cc Omnipaque 300 was administered intravenously 20 mL Isovue 300 administered into the collecting system FLUOROSCOPY TIME:  9 minutes, 48 seconds (48 mGy) COMPLICATIONS: None immediate. PROCEDURE: The procedure, risks, benefits, and alternatives were explained to the patient. Questions regarding the procedure were encouraged and answered. The patient understands and consents to the procedure. A timeout was performed prior to the initiation of the procedure. The left flank region was prepped with Betadine in a sterile fashion, and a sterile drape was  applied covering the operative field. A sterile gown and sterile gloves were used for the procedure. Local anesthesia was provided with 1% Lidocaine  with epinephrine. Ultrasound was used to localize the left kidney however demonstrated near complete resolution of previously noted left-sided pelvicaliectasis. Several attempts were made to cannulate the decompressed left renal collecting system under sonographic guidance however this ultimately proved unsuccessful. As such, contrast was administered via peripheral IV demonstrating opacification of left renal collecting system with passage of contrast through the distal aspect of the left ureter however there was suspicion for a residual nonobstructing left-sided disc cerebral stone As such, an opacified left midpole posteroinferior calyx was targeted with a 21 gauge needle. Calyceal puncture was confirmed with contrast injection. The calyx was cannulated with a Nitrex wire which was advanced to the level the superior aspect the left ureter. Next, the access needle was exchanged for the inner catheter from the [place] set which was advanced to the level of the mid ureter. Contrast was injected from the distal aspect the left ureter however again there was concern for a nonocclusive stone within distal aspect left ureter. As such, decision was made to place a definitive percutaneous nephrostomy catheter. Under fluoroscopic guidance, the track was dilated with the remainder of the [place] set. A short Amplatz wire was placed to the level of the distal aspect the left ureter. The track was dilated allowing placement a 10 French percutaneous nephrostomy catheter with end coiled within the left renal pelvis. Contrast was injected and several spot radiographs were obtained in various obliquities confirming access. The catheter was secured at the skin with a Prolene retention suture and a gravity bag was placed. A dressing was placed. The patient tolerated procedure well without immediate postprocedural complication. FINDINGS: Sonographic evaluation demonstrates resolution previously noted moderate left-sided  pelvicaliectasis. Given lack of any significant pelvicaliectasis, the left renal collecting system was unable to be cannulated with ultrasound guidance. As such, contrast was administered intravenously demonstrating opacification of the left renal collecting system with passage of contrast through the nondilated ureter to the level of the distal aspect the left ureter. Contrast was noted to pass to the level of the urinary bladder however there was concern for residual nonocclusive stone. As such, a posterior midpole calyx was targeted fluoroscopically allowing advancement of catheter to the mid aspect the left ureter however again contrast injection suggested the potential for a nonocclusive distal left ureteral stone. As such, a 22 [place] percutaneous nephrostomy was placed via the accessed posterior midpole calyx with end coiled and locked within left renal pelvis. IMPRESSION: 1. Successful ultrasound and predominantly fluoroscopic guided placement of a left-sided 10 French percutaneous nephrostomy catheter. 2. Resolution of previously noted left-sided pelvicaliectasis attributable to potential either passage of the distal left ureteral stone into the urinary bladder versus the stone no longer being occlusive in nature. Further evaluation with noncontrast CT scan the abdomen pelvis could be performed as indicated. PLAN: Recommend noncontrast CT scan the abdomen pelvis to evaluate for passage of the distal left ureteral stone. If ureteral stone has indeed migrated to the level of the urinary bladder consideration for nephrostomy catheter removal could be considered as deemed appropriate by Dr. Alyson Ingles (urology). Above findings discussed with Dr. Alyson Ingles at the time of procedure completion. Electronically Signed   By: Sandi Mariscal M.D.   On: 04/05/2019 18:04    Anti-infectives: Anti-infectives (From admission, onward)   Start     Dose/Rate Route Frequency Ordered Stop   04/04/19 2359  metroNIDAZOLE (  FLAGYL)  tablet 500 mg     500 mg Oral Every 8 hours 04/04/19 2307     04/04/19 2359  ceFEPIme (MAXIPIME) 2 g in sodium chloride 0.9 % 100 mL IVPB     2 g 200 mL/hr over 30 Minutes Intravenous Every 8 hours 04/04/19 2319 04/14/19 1159   04/04/19 2315  ceFEPime (MAXIPIME) IVPB  Status:  Discontinued    Note to Pharmacy: Indication:  Osteomyelitis/Wound infection Last Day of Therapy:  04/14/2019 Labs - Once weekly:  CBC/D and BMP, Labs - Every other week:  ESR and CRP     2 g Intravenous Every 8 hours 04/04/19 2307 04/04/19 2318      Assessment/Plan:  1 - Left Ureteral Stone - now temporized with neph tube. Will need antegrade ureterosocpy in elective setting, we will arrange.   2 - Urethral Obliteration / Chronic SP Tube - continue current SPT.    Alexis Frock 04/06/2019

## 2019-04-06 NOTE — Discharge Summary (Signed)
Physician Discharge Summary  Christopher [name]:[id number] DOB: 1965-06-17 DOA: 04/04/2019  PCP: Vivi Barrack, MD  Admit date: 04/04/2019 Discharge date: 04/06/2019  Admitted From: Home Disposition:  Home  Recommendations for Outpatient Follow-up:  1. Follow up with PCP in 1-2 weeks 2. 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 Patient was seen in the ED on 10/17 for the symptoms and diagnosed with obstructive nephrolithiasis. He was prescribed Flomax and discharged with plan to follow-up with urology.Patient states he has been taking Flomax but continues to have vomiting and left-sided flank/pelvic pain. No fevers or chills. No abdominal pain  ED Course:Afebrile. Tachycardic and tachypneic on arrival. Not hypotensive. No leukocytosis. UA with large amount of leukocytes, 11-20 RBCs, and greater than 50 WBCs. Patient received medications for pain including Dilaudid and Toradol. Received Zofran for nausea. In addition, 1 L normal saline bolus.   Discharge Diagnoses:  Principal Problem:   Obstructive nephropathy Active Problems:   Chronic pain syndrome   Colostomy status (HCC)   Chronic osteomyelitis of pelvis, left (HCC)   Intractable  nausea and vomiting   Intractable nausea and vomiting secondary to obstructive nephrolithiasis -CT from 10/17 demonstrated new left moderate hydronephrosis with a distal left ureteral 5 mm calculus. Mild right hydronephrosis. Nonobstructing 4 mm right upper pole renal calculi. Patient has a chronic suprapubic catheter. UA with large amount of leukocytes, 11-20 RBCs, and greater than 50 WBCs. -Pt remains afebrile and no leukocytosis. -Pt was continued with IV dilaudid as needed while in hospital -Urology consulted and pt now s/p percutaneous nephrostomy tube placement on 10/23 -Repeat bmet in AM  Chronic left posterior hip wound/chronic osteomyelitis -Pt underwent removal of pelvic hardware and excision of superficial pelvic bone for osteomyelitis on 9/21 by Ortho and discharged with 6 week course of IV cefepime via PICC lineand oral Metronidazole(end date of antibiotics April 14, 2019).CT done 10/17 showing no definite fluid collection or subcutaneous emphysema at the postsurgical site at the posterior left iliac wing. -Pt had been continued on IV abx  Status post colostomy -Cont with ostomy care as tolerated  Chronic anxiety/depression -Continue home Klonopin -Hold home Celexa and Seroquel at this time given QT prolongation on EKG which is new compared to prior EKG. QTc 603. -Repeat EKG with QTc improved to 555  QT prolongation on EKG -Cardiac monitoring while in hospital -Repeat EKG reviewed, QTC improved to 555  GERD -Continue PPI as tolerated  Chronic pain syndrome -Continued with DilaudidPRNand ToradolPRNas above while in hospital -Continue home gabapentin and Robaxin -Pain seemed better controlled after PCN placement. Would resume home meds on d/c  Severe protein calorie malnutrition BMI 16.8 and albumin 3.0. -Nutrition was consulted  Physical debility/ambulatory dysfunction -PT/OT consulted, recommendations for home health -Continued with fall  precautions   Discharge Instructions   Allergies as of 04/06/2019   No Known Allergies  Medication List    TAKE these medications   acetaminophen 325 MG tablet Commonly known as: TYLENOL Take 2 tablets (650 mg total) by mouth every 6 (six) hours as needed for mild pain (or Fever >/= 101).   albuterol 108 (90 Base) MCG/ACT inhaler Commonly known as: VENTOLIN HFA Inhale 2 puffs into the lungs every 6 (six) hours as needed for wheezing or shortness of breath.   ceFEPime  IVPB Commonly known as: MAXIPIME Inject 2 g into the vein every 8 (eight) hours. Indication:  Osteomyelitis/Wound infection Last Day of Therapy:  04/14/2019 Labs - Once weekly:  CBC/D and BMP, Labs - Every other week:  ESR and CRP   citalopram 20 MG tablet Commonly known as: CeleXA Take 1 tablet (20 mg total) by mouth daily. For mood/anxiety   clonazePAM 0.5 MG tablet Commonly known as: KLONOPIN Take 0.5 tablets (0.25 mg total) by mouth 3 (three) times daily as needed (anxiety).   [employer] Melatonin 3 MG Tabs Generic drug: Melatonin TAKE 1 TABLET (3 MG TOTAL) BY MOUTH AT BEDTIME. What changed: See the new instructions.   dronabinol 2.5 MG capsule Commonly known as: MARINOL Take 2.5 mg by mouth 2 (two) times daily before a meal.   feeding supplement (PRO-STAT SUGAR FREE 64) Liqd Take 30 mLs by mouth 3 (three) times daily with meals.   fentaNYL 50 MCG/HR Commonly known as: [place] 1 patch onto the skin every other day.   gabapentin 300 MG capsule Commonly known as: NEURONTIN TAKE 1 CAPSULE BY MOUTH THREE TIMES A DAY What changed: See the new instructions.   guaiFENesin 600 MG 12 hr tablet Commonly known as: Mucinex Take 2 tablets (1,200 mg total) by mouth 2 (two) times daily.   methocarbamol 500 MG tablet Commonly known as: ROBAXIN TAKE 1 TABLET BY MOUTH EVERY 6 HOURS AS NEEDED FOR MUSCLE SPASMS What changed: See the new instructions.   metroNIDAZOLE 500 MG tablet Commonly known  as: Flagyl Take 1 tablet (500 mg total) by mouth 3 (three) times daily.   multivitamin with minerals Tabs tablet Take 1 tablet by mouth daily.   omeprazole 20 MG capsule Commonly known as: PRILOSEC Take 20 mg by mouth daily.   oxyCODONE-acetaminophen 5-325 MG tablet Commonly known as: Percocet Take 1 tablet by mouth every 4 (four) hours as needed for severe pain. Fill as of 8/262020- thank you   polyethylene glycol 17 g packet Commonly known as: MIRALAX / GLYCOLAX MIX AND TAKE 17 G BY MOUTH DAILY. What changed: See the new instructions.   prochlorperazine 5 MG tablet Commonly known as: COMPAZINE Take 1-2 tablets (5-10 mg total) by mouth every 6 (six) hours as needed for nausea.   QUEtiapine 50 MG tablet Commonly known as: SEROquel Take 1 tablet (50 mg total) by mouth at bedtime.   senna-docusate 8.6-50 MG tablet Commonly known as: Senokot-S Take 2 tablets by mouth at bedtime.   tamsulosin 0.4 MG Caps capsule Commonly known as: Flomax Take 1 capsule (0.4 mg total) by mouth daily.      Follow-up Information    Vivi Barrack, MD. Schedule an appointment as soon as possible for a visit in 1 week(s).   Specialty: Family Medicine Contact information: [address] [place] [address] [phone]        [place]. Schedule an appointment as soon as possible for a visit.   Contact information: [place] [phone]  No Known Allergies  Consultations:  Urology  IR  Procedures/Studies: Ct Abdomen Pelvis Wo Contrast  Result Date: 03/07/2019 CLINICAL DATA:  53 year old male with abdominal pain Recent explant pelvic hardware EXAM: CT ABDOMEN AND PELVIS WITHOUT CONTRAST TECHNIQUE: Multidetector CT imaging of the abdomen and pelvis was performed following the standard protocol without IV contrast. COMPARISON:  CT February 28, 2019 FINDINGS: Lower chest: Atelectasis at the lung bases.  Nodularity at the base of the left lung, sub pulmonic, on the diaphragm, may reflect noncalcified plaque. No acute finding of the lung base. Hepatobiliary: Unremarkable liver.  Unremarkable gallbladder. Pancreas: Unremarkable Spleen: Unremarkable Adrenals/Urinary Tract: Unremarkable adrenal glands. Right hydronephrosis and dilation of the length of the ureter. There is a rounded stone at the right ureterovesical junction on image 76, which appears separate from the stone disease that was present previously. Chronic stones at the right bladder base in a linear configuration. In addition there are a cluster of small stones at the posterior lateral right collecting system as well as the more superior collecting system. No evidence of left-sided hydronephrosis. Small stone in the collecting system is unchanged. No evidence of dilated left ureter. Suprapubic catheter with balloon retention terminates in the urinary bladder which is relatively decompressed. Stomach/Bowel: Unremarkable stomach. Unremarkable colon which is decompressed. Unremarkable ostomy on the left abdominal wall, with Hartmann's pouch unremarkable. Normal appendix. There are few loops of bowel in the mid abdomen with air-fluid levels, borderline dilated, not well evaluated on the current CT. No discrete transition identified. Vascular/Lymphatic: Atherosclerotic changes again noted within the lower abdominal aorta and the iliac arteries. Changes of prior embolization within the right pelvis with coil mass present. Reproductive: Calcifications in the prostate. Other: No ventral wall hernia. Musculoskeletal: Posttraumatic and postoperative changes of the pelvis. The previous surgical hardware of the left sacroiliac fixation has been removed, now with a soft tissue defect overlying the left sacrum and sacroiliac joint extending to the bone with partial debridement/bony excision. Redemonstration of partial healing/remodeling of the bilateral pubic rami  fractures, sacral fracture, left iliac fracture. Heterotopic ossification within left gluteal musculature. The hardware of the left pubic ramus and of the proximal left femur remain in position. Postoperative changes on the deep aspect of the surgical site not well evaluated, with no evidence of large new fluid collection to indicate hematoma or new abscess. IMPRESSION: New right-sided hydronephrosis with obstructing stone at the right ureterovesical junction. Additional bilateral nonobstructing nephrolithiasis, similar to the comparison. Borderline dilated small bowel loops in the mid abdomen, presumably postoperative ileus given the recent surgery. No discrete transition identified. Interval surgical changes of left iliac debridement and explant of surgical hardware involving the sacroiliac fixation, not well evaluated given the absence of contrast. No complicating features identified. 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 Additional ancillary findings as above. Electronically Signed   By: Corrie Mckusick D.O.   On: 03/07/2019 19:13   Ct Abdomen Pelvis W Contrast  Result Date: 03/30/2019 CLINICAL DATA:  Abdominal pain, fever, question of abscess EXAM: CT ABDOMEN AND PELVIS WITH CONTRAST TECHNIQUE: Multidetector CT imaging of the abdomen and pelvis was performed using the standard protocol following bolus administration of intravenous contrast. CONTRAST:  142m OMNIPAQUE IOHEXOL 300 MG/ML  SOLN COMPARISON:  March 07, 2019 FINDINGS: Lower chest: The visualized heart size within normal limits. No pericardial fluid/thickening. No hiatal hernia. Again noted is  left pleural based nodularity and thickening as on the prior exam. Hepatobiliary: The liver is normal in density without focal abnormality.The main portal vein is patent. No  evidence of calcified gallstones, gallbladder wall thickening or biliary dilatation. Pancreas: Unremarkable. No pancreatic ductal dilatation or surrounding inflammatory changes. Spleen: Normal in size without focal abnormality. Adrenals/Urinary Tract: Both adrenal glands appear normal. There is new left moderate pelvicaliectasis and ureterectasis down to the level of the distal ureter where there is a 5 mm calculus best seen on series 2, image 71. There remains mild right pelviectasis and ureterectasis. There is a 4 mm calculus seen in the upper pole of the right kidney. The bladder is decompressed with a suprapubic catheter. Stomach/Bowel: The stomach, small bowel, and colon are normal in appearance. No inflammatory changes, wall thickening, or obstructive findings. Vascular/Lymphatic: There are no enlarged mesenteric, retroperitoneal, or pelvic lymph nodes. Embolization coil seen within the right lower pelvis. Scattered atherosclerosis is noted. Reproductive: The prostate is unremarkable. Other: Again noted is presacral soft tissue swelling. Musculoskeletal: Extensive bilateral pelvic fractures are again noted with screw fixation across the right ilium and right femur. There is partially healed fractures seen bilaterally involving the bilateral pubic rami, sacrum, and left iliac wing. There is large amount of heterotopic ossification overlying the left posterior iliac wing as on the prior exam. Again noted is postoperative changes overlying the left ilium with a large postsurgical defect overlying the left iliac wing. No new focal fluid collection or subcutaneous emphysema is identified within this region. IMPRESSION: 1. New left moderate hydronephrosis with a distal left ureteral 5 mm calculus. 2. Mild right hydronephrosis. The previously seen right UVJ stone is no longer identified. 3. Non-obstructing 4 mm right upper pole renal calculi. 4. Multiple bilateral healing fractures with surgical fixation of the left  ilium and proximal femur. 5. Large postsurgical defect overlying the posterior left iliac wing. No definite new fluid collection or subcutaneous emphysema noted. Electronically Signed   By: Prudencio Pair M.D.   On: 03/30/2019 03:15   Dg Chest Portable 1 View  Result Date: 03/30/2019 CLINICAL DATA:  Initial evaluation for acute cough, shortness of breath. History of smoking. EXAM: PORTABLE CHEST 1 VIEW COMPARISON:  Prior radiograph from 02/28/2019. FINDINGS: Cardiac and mediastinal silhouettes are stable in size and contour, and remain within normal limits. Left-sided PICC catheter in place with tip overlying the distal SVC. Lungs are mildly hypoinflated. No focal infiltrates. Mild diffuse bronchitic changes. No edema or visible effusion. No pneumothorax. No acute osseous finding. IMPRESSION: 1. Mild diffuse bronchitic changes, which could reflect sequelae of acute bronchiolitis or changes related to smoking. No focal infiltrates to suggest pneumonia. 2. Left-sided PICC catheter in place with tip overlying the distal SVC. Electronically Signed   By: Jeannine Boga M.D.   On: 03/30/2019 01:55   Ir Nephrostomy Placement Left  Result Date: 04/05/2019 INDICATION: History of traumatic urethral transsection, now with symptomatic obstructing left-sided distal ureteral stone in the setting of a suprapubic catheter. Request made for image guided placement of a percutaneous nephrostomy catheter for diversion purposes. EXAM: 1. ULTRASOUND AND FLUOROSCOPIC GUIDED PUNCTURE OF THE LEFT RENAL COLLECTING SYSTEM 2. LEFT PERCUTANEOUS NEPHROSTOMY TUBE PLACEMENT. COMPARISON:  CT abdomen and pelvis-03/30/2019 MEDICATIONS: Patient is currently admitted to the hospital receiving intravenous antibiotics; the antibiotic was administered in an appropriate time frame prior to skin puncture. ANESTHESIA/SEDATION: Moderate (conscious) sedation was employed during this procedure. A total of Versed 4 mg and Fentanyl 200 mcg was  administered intravenously.  Moderate Sedation Time: 46 minutes. The patient's level of consciousness and vital signs were monitored continuously by radiology nursing throughout the procedure under my direct supervision. CONTRAST:  70 cc Omnipaque 300 was administered intravenously 20 mL Isovue 300 administered into the collecting system FLUOROSCOPY TIME:  9 minutes, 48 seconds (48 mGy) COMPLICATIONS: None immediate. PROCEDURE: The procedure, risks, benefits, and alternatives were explained to the patient. Questions regarding the procedure were encouraged and answered. The patient understands and consents to the procedure. A timeout was performed prior to the initiation of the procedure. The left flank region was prepped with Betadine in a sterile fashion, and a sterile drape was applied covering the operative field. A sterile gown and sterile gloves were used for the procedure. Local anesthesia was provided with 1% Lidocaine with epinephrine. Ultrasound was used to localize the left kidney however demonstrated near complete resolution of previously noted left-sided pelvicaliectasis. Several attempts were made to cannulate the decompressed left renal collecting system under sonographic guidance however this ultimately proved unsuccessful. As such, contrast was administered via peripheral IV demonstrating opacification of left renal collecting system with passage of contrast through the distal aspect of the left ureter however there was suspicion for a residual nonobstructing left-sided disc cerebral stone As such, an opacified left midpole posteroinferior calyx was targeted with a 21 gauge needle. Calyceal puncture was confirmed with contrast injection. The calyx was cannulated with a Nitrex wire which was advanced to the level the superior aspect the left ureter. Next, the access needle was exchanged for the inner catheter from the [place] set which was advanced to the level of the mid ureter. Contrast was  injected from the distal aspect the left ureter however again there was concern for a nonocclusive stone within distal aspect left ureter. As such, decision was made to place a definitive percutaneous nephrostomy catheter. Under fluoroscopic guidance, the track was dilated with the remainder of the [place] set. A short Amplatz wire was placed to the level of the distal aspect the left ureter. The track was dilated allowing placement a 10 French percutaneous nephrostomy catheter with end coiled within the left renal pelvis. Contrast was injected and several spot radiographs were obtained in various obliquities confirming access. The catheter was secured at the skin with a Prolene retention suture and a gravity bag was placed. A dressing was placed. The patient tolerated procedure well without immediate postprocedural complication. FINDINGS: Sonographic evaluation demonstrates resolution previously noted moderate left-sided pelvicaliectasis. Given lack of any significant pelvicaliectasis, the left renal collecting system was unable to be cannulated with ultrasound guidance. As such, contrast was administered intravenously demonstrating opacification of the left renal collecting system with passage of contrast through the nondilated ureter to the level of the distal aspect the left ureter. Contrast was noted to pass to the level of the urinary bladder however there was concern for residual nonocclusive stone. As such, a posterior midpole calyx was targeted fluoroscopically allowing advancement of catheter to the mid aspect the left ureter however again contrast injection suggested the potential for a nonocclusive distal left ureteral stone. As such, a 5 [place] percutaneous nephrostomy was placed via the accessed posterior midpole calyx with end coiled and locked within left renal pelvis. IMPRESSION: 1. Successful ultrasound and predominantly fluoroscopic guided placement of a left-sided 10 French percutaneous  nephrostomy catheter. 2. Resolution of previously noted left-sided pelvicaliectasis attributable to potential either passage of the distal left ureteral stone into the urinary bladder versus the stone no longer being occlusive in nature.  Further evaluation with noncontrast CT scan the abdomen pelvis could be performed as indicated. PLAN: Recommend noncontrast CT scan the abdomen pelvis to evaluate for passage of the distal left ureteral stone. If ureteral stone has indeed migrated to the level of the urinary bladder consideration for nephrostomy catheter removal could be considered as deemed appropriate by Dr. Alyson Ingles (urology). Above findings discussed with Dr. Alyson Ingles at the time of procedure completion. Electronically Signed   By: Sandi Mariscal M.D.   On: 04/05/2019 18:04     Subjective: Eager to go home today  Discharge Exam: Vitals:   04/06/19 0459 04/06/19 0940  BP: 130/81 134/84  Pulse: 77 78  Resp: 17 16  Temp: 97.8 F (36.6 C) 98.4 F (36.9 C)  SpO2: 94% 97%   Vitals:   04/05/19 1803 04/05/19 2146 04/06/19 0459 04/06/19 0940  BP: 137/86 (!) 141/90 130/81 134/84  Pulse: 82 77 77 78  Resp: '18 17 17 16  ' Temp: 97.7 F (36.5 C) 98.1 F (36.7 C) 97.8 F (36.6 C) 98.4 F (36.9 C)  TempSrc: Oral  Oral Oral  SpO2: 96% 98% 94% 97%  Weight:      Height:        General: Pt is alert, awake, not in acute distress Cardiovascular: RRR, S1/S2 +, no rubs, no gallops Respiratory: CTA bilaterally, no wheezing, no rhonchi Abdominal: Soft, NT, ND, bowel sounds + Extremities: no edema, no cyanosis   The results of significant diagnostics from this hospitalization (including imaging, microbiology, ancillary and laboratory) are listed below for reference.     Microbiology: Recent Results (from the past 240 hour(s))  Blood culture (routine x 2)     Status: None   Collection Time: 03/30/19  1:07 AM   Specimen: BLOOD RIGHT HAND  Result Value Ref Range Status   Specimen Description    Final    BLOOD RIGHT HAND Performed at [facility] [address], [place], [place] [address]    Special Requests   Final    BOTTLES DRAWN AEROBIC ONLY Blood Culture results may not be optimal due to an inadequate volume of blood received in culture bottles Performed at [facility] [address] [place], [place] [address]    Culture   Final    NO GROWTH 5 DAYS Performed at [facility], [place] [address], [place], [place] [address]    Report Status 04/04/2019 FINAL  Final  Blood culture (routine x 2)     Status: None   Collection Time: 03/30/19  1:28 AM   Specimen: BLOOD  Result Value Ref Range Status   Specimen Description   Final    BLOOD PICC LINE Performed at [facility] [address] [place], [place] [address]    Special Requests   Final    BOTTLES DRAWN AEROBIC AND ANAEROBIC Blood Culture adequate volume Performed at [facility] [address] [place], [place] [address]    Culture   Final    NO GROWTH 5 DAYS Performed at [facility], [place] [address], [place], [place] [address]    Report Status 04/04/2019 FINAL  Final  SARS CORONAVIRUS 2 (TAT 6-24 HRS) Nasopharyngeal Nasopharyngeal Swab     Status: None   Collection Time: 04/04/19 10:23 PM   Specimen: Nasopharyngeal Swab  Result Value Ref Range Status   SARS Coronavirus 2 NEGATIVE NEGATIVE Final    Comment: (NOTE) SARS-CoV-2 target nucleic acids are NOT DETECTED. The SARS-CoV-2  RNA is generally detectable in upper and lower respiratory specimens during the acute phase of infection. Negative results do not preclude SARS-CoV-2 infection, do not rule out co-infections with other pathogens, and should not be used as the sole basis for treatment or other patient management decisions. Negative results must be combined with clinical observations, patient history, and epidemiological information. The expected result is  Negative. Fact Sheet for Patients: SugarRoll.be Fact Sheet for Healthcare Providers: https://www.woods-mathews.com/ This test is not yet approved or cleared by the [place] FDA and  has been authorized for detection and/or diagnosis of SARS-CoV-2 by FDA under an Emergency Use Authorization (EUA). This EUA will remain  in effect (meaning this test can be used) for the duration of the COVID-19 declaration under Section 56 4(b)(1) of the Act, 21 U.S.C. section 360bbb-3(b)(1), unless the authorization is terminated or revoked sooner. Performed at [facility], [place] [address] [place], [place] [address]      Labs: BNP (last 3 results) No results for input(s): BNP in the last 8760 hours. Basic Metabolic Panel: Recent Labs  Lab 04/04/19 1854 04/06/19 0330  NA 142 139  K 3.8 3.3*  CL 107 109  CO2 22 23  GLUCOSE 125* 77  BUN 9 13  CREATININE 0.59* 0.47*  CALCIUM 10.3 9.5   Liver Function Tests: Recent Labs  Lab 04/04/19 1854  AST 30  ALT 19  ALKPHOS 102  BILITOT 0.6  PROT 7.9  ALBUMIN 3.0*   No results for input(s): LIPASE, AMYLASE in the last 168 hours. No results for input(s): AMMONIA in the last 168 hours. CBC: Recent Labs  Lab 04/04/19 1854 04/06/19 0330  WBC 5.3 5.1  NEUTROABS 4.0  --   HGB 13.8 11.4*  HCT 44.4 36.1*  MCV 104.7* 102.6*  PLT 286 243   Cardiac Enzymes: No results for input(s): CKTOTAL, CKMB, CKMBINDEX, TROPONINI in the last 168 hours. BNP: Invalid input(s): POCBNP CBG: Recent Labs  Lab 04/05/19 1805  GLUCAP 88   D-Dimer No results for input(s): DDIMER in the last 72 hours. Hgb A1c No results for input(s): HGBA1C in the last 72 hours. Lipid Profile No results for input(s): CHOL, HDL, LDLCALC, TRIG, CHOLHDL, LDLDIRECT in the last 72 hours. Thyroid function studies No results for input(s): TSH, T4TOTAL, T3FREE, THYROIDAB in the last 72 hours.  Invalid input(s): FREET3 Anemia work  up No results for input(s): VITAMINB12, FOLATE, FERRITIN, TIBC, IRON, RETICCTPCT in the last 72 hours. Urinalysis    Component Value Date/Time   COLORURINE YELLOW 04/04/2019 2200   APPEARANCEUR CLOUDY (A) 04/04/2019 2200   LABSPEC 1.023 04/04/2019 2200   PHURINE 5.0 04/04/2019 2200   GLUCOSEU NEGATIVE 04/04/2019 2200   HGBUR SMALL (A) 04/04/2019 2200   BILIRUBINUR NEGATIVE 04/04/2019 2200   KETONESUR 5 (A) 04/04/2019 2200   PROTEINUR 30 (A) 04/04/2019 2200   NITRITE NEGATIVE 04/04/2019 2200   LEUKOCYTESUR LARGE (A) 04/04/2019 2200   Sepsis Labs Invalid input(s): PROCALCITONIN,  WBC,  LACTICIDVEN Microbiology Recent Results (from the past 240 hour(s))  Blood culture (routine x 2)     Status: None   Collection Time: 03/30/19  1:07 AM   Specimen: BLOOD RIGHT HAND  Result Value Ref Range Status   Specimen Description   Final    BLOOD RIGHT HAND Performed at [facility], [place] [address] [place], [place] [address]    Special Requests   Final    BOTTLES DRAWN AEROBIC ONLY Blood Culture results may not be optimal  due to an inadequate volume of blood received in culture bottles Performed at [place] [address] [place], [place] [address]    Culture   Final    NO GROWTH 5 DAYS Performed at [facility], [place] [address], [place], [place] [address]    Report Status 04/04/2019 FINAL  Final  Blood culture (routine x 2)     Status: None   Collection Time: 03/30/19  1:28 AM   Specimen: BLOOD  Result Value Ref Range Status   Specimen Description   Final    BLOOD PICC LINE Performed at [facility] [address] [place], [place] [address]    Special Requests   Final    BOTTLES DRAWN AEROBIC AND ANAEROBIC Blood Culture adequate volume Performed at [place] [address] [place], [place] [address]    Culture   Final    NO GROWTH 5 DAYS Performed at [facility], [place] [address] [place], [place] [address]    Report Status 04/04/2019 FINAL  Final  SARS CORONAVIRUS 2 (TAT 6-24 HRS) Nasopharyngeal Nasopharyngeal Swab     Status: None   Collection Time: 04/04/19 10:23 PM   Specimen: Nasopharyngeal Swab  Result Value Ref Range Status   SARS Coronavirus 2 NEGATIVE NEGATIVE Final    Comment: (NOTE) SARS-CoV-2 target nucleic acids are NOT DETECTED. The SARS-CoV-2 RNA is generally detectable in upper and lower respiratory specimens during the acute phase of infection. Negative results do not preclude SARS-CoV-2 infection, do not rule out co-infections with other pathogens, and should not be used as the sole basis for treatment or other patient management decisions. Negative results must be combined with clinical observations, patient history, and epidemiological information. The expected result is Negative. Fact Sheet for Patients: SugarRoll.be Fact Sheet for Healthcare Providers: https://www.woods-mathews.com/ This test is not yet approved or cleared by the [place] FDA and  has been authorized for detection and/or diagnosis of SARS-CoV-2 by FDA under an Emergency Use Authorization (EUA). This EUA will remain  in effect (meaning this test can be used) for the duration of the COVID-19 declaration under Section 56 4(b)(1) of the Act, 21 U.S.C. section 360bbb-3(b)(1), unless the authorization is terminated or revoked sooner. Performed at [facility], [place] [address] [place],  [address]    Time spent: 30 min  SIGNED:   Marylu Lund, MD  [facility] 04/06/2019, 2:19 PM  If 7PM-7AM, please contact night-coverage

## 2019-04-06 NOTE — Care Management (Signed)
Notified by nurse that patient needs PTAR transport home. Verified with patient address on facesheet is correct and his wife is at home, able to accept him from [place].  Nurse requested time for possible PICC removal, PTAR papers left on chart, nurse agreed to ready when ready.

## 2019-04-06 NOTE — Plan of Care (Signed)
  Problem: Health Behavior/Discharge Planning: Goal: Ability to manage health-related needs will improve Outcome: Progressing   Problem: Pain Managment: Goal: General experience of comfort will improve Outcome: Progressing   
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
## 2019-04-08 ENCOUNTER — Telehealth: Payer: Self-pay | Admitting: Plastic Surgery

## 2019-04-08 NOTE — Telephone Encounter (Signed)

## 2019-04-09 ENCOUNTER — Other Ambulatory Visit: Payer: Self-pay

## 2019-04-09 ENCOUNTER — Telehealth: Payer: Self-pay

## 2019-04-09 ENCOUNTER — Encounter: Payer: Self-pay | Admitting: Surgical

## 2019-04-09 ENCOUNTER — Ambulatory Visit (INDEPENDENT_AMBULATORY_CARE_PROVIDER_SITE_OTHER): Payer: No Typology Code available for payment source | Admitting: Surgical

## 2019-04-09 VITALS — BP 118/74 | HR 67 | Temp 98.2°F

## 2019-04-09 DIAGNOSIS — S31809D Unspecified open wound of unspecified buttock, subsequent encounter: Secondary | ICD-10-CM

## 2019-04-09 NOTE — Progress Notes (Signed)
   Subjective:     Patient ID: Christopher [name], male    DOB: Nov 11, 1965, 53 y.o.   MRN: [id number]  Chief Complaint  Patient presents with  . Follow-up    2 weeks    HPI: The patient is a 53 y.o. male here for follow-up after excision of left hip wound and placement of Acell on 03/04/19 by Dr. Marla Roe.   He is doing very well. He is now coming to the clinic in a power chair. Pain significantly improved and he is now ambulating in the house and even to the mailbox with walker in assistance.  He is able to stand in the clinic today for evaluation and wound check. Denies any fever, chills, n/v.  Unfortunately, since his last visit he had significant back pain which was evaluated in the ED and was found to have a kidney stone for which he had a urostomy placed on Friday. He is doing well overall. He had colostomy bag and his cath still in place.  They have been packing his L hip wound and using calcium alginate on his gluteal cleft wound daily. Since last visit, wound has decreased in size. Today on exam, L hip wound is ~ 1.5 x 1.5 x 2.5 cm. No sign of infection. The external area has epithelialized.  His gluteal cleft wound is difficult to assess, but it is improving, less drainage and bleeding noted per wife.    Review of Systems  Constitutional: Positive for activity change. Negative for chills, diaphoresis and fever.  Respiratory: Negative for shortness of breath and wheezing.   Cardiovascular: Negative for chest pain and leg swelling.  Gastrointestinal: Negative for abdominal pain, diarrhea, nausea and vomiting.  Musculoskeletal: Positive for arthralgias, gait problem and myalgias.  Skin: Positive for color change and wound.  Neurological: Positive for weakness. Negative for dizziness and headaches.     Objective:   Vital Signs BP 118/74 (BP Location: Right Arm, Patient Position: Sitting, Cuff Size: Normal)   Pulse 67   Temp 98.2 F (36.8 C) (Temporal)   SpO2 98%  Vital  Signs and Nursing Note Reviewed  Physical Exam  Constitutional: He is oriented to person, place, and time. No distress.  Thin male   HENT:  Head: Normocephalic and atraumatic.  Pulmonary/Chest: Effort normal.  Abdominal: He exhibits no distension. There is no abdominal tenderness.  Colostomy bag in place, Cath in place, urostomy in place (L)   Neurological: He is alert and oriented to person, place, and time.  Skin: Skin is warm and dry. He is not diaphoretic. There is erythema.     Psychiatric: Mood and affect normal.    Assessment/Plan:     ICD-10-CM   1. Wound of gluteal cleft, unspecified laterality, subsequent encounter  S31.809D    Mr. [name] is doing a lot better since his last visit, he is now ambulating with assistance and walker frequently. No longer bed bound. He had urostomy placed for kidney stone on L, but otherwise improving.  L hip wound improving overall. Less open, less deep. Continue with packing daily. Donated Acell applied in office. Continue with packing tomorrow and KY jelly, gauze.  KY jelly applied to gluteal cleft wound, re-start calcium alginate tomorrow.  Follow up in 2 - 3 weeks. Call with questions or concerns.   Pictures were obtained of the patient and placed in the chart with the patient's or guardian's permission.   Carola Rhine Tatem Holsonback, PA-C 04/09/2019, 5:43 PM

## 2019-04-09 NOTE — Telephone Encounter (Signed)
Voicemail not setup.  Will try patient again

## 2019-04-10 ENCOUNTER — Encounter: Payer: Self-pay | Admitting: Internal Medicine

## 2019-04-10 ENCOUNTER — Ambulatory Visit (INDEPENDENT_AMBULATORY_CARE_PROVIDER_SITE_OTHER): Payer: No Typology Code available for payment source | Admitting: Internal Medicine

## 2019-04-10 VITALS — BP 110/77 | HR 86 | Temp 97.7°F

## 2019-04-10 DIAGNOSIS — Z5181 Encounter for therapeutic drug level monitoring: Secondary | ICD-10-CM | POA: Diagnosis not present

## 2019-04-10 DIAGNOSIS — M86652 Other chronic osteomyelitis, left thigh: Secondary | ICD-10-CM | POA: Diagnosis not present

## 2019-04-10 DIAGNOSIS — Z452 Encounter for adjustment and management of vascular access device: Secondary | ICD-10-CM | POA: Diagnosis not present

## 2019-04-10 NOTE — Assessment & Plan Note (Signed)
Healing and inflammatory markers have decreased.  I will have them rechecked this week to be sure and if no issues, plan to stop on 11/1 as planned and can have picc line removed then.

## 2019-04-10 NOTE — Assessment & Plan Note (Signed)
No issues with picc line.  Ok to remove after last dose 11/1.
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
## 2019-04-10 NOTE — Assessment & Plan Note (Signed)
Creat, LFTs ok 

## 2019-04-10 NOTE — Progress Notes (Signed)
LPN called and spoke with Coretta with [facility] and gave verbal order per Dr. Linus Salmons to draw sed rate and CRP this week and pull picc at the end of therapy on 04/14/19. Verbal orders read back and understood.  Eugenia Mcalpine

## 2019-04-11 ENCOUNTER — Encounter (HOSPITAL_COMMUNITY): Payer: Self-pay | Admitting: Emergency Medicine

## 2019-04-11 ENCOUNTER — Emergency Department (HOSPITAL_COMMUNITY)
Admission: EM | Admit: 2019-04-11 | Discharge: 2019-04-11 | Disposition: A | Discharge status: Home / Self Care | Payer: BC Managed Care – PPO | Attending: Emergency Medicine | Admitting: Emergency Medicine

## 2019-04-11 ENCOUNTER — Emergency Department (HOSPITAL_COMMUNITY): Payer: BC Managed Care – PPO

## 2019-04-11 DIAGNOSIS — Z79899 Other long term (current) drug therapy: Secondary | ICD-10-CM | POA: Insufficient documentation

## 2019-04-11 DIAGNOSIS — R1032 Left lower quadrant pain: Secondary | ICD-10-CM | POA: Diagnosis not present

## 2019-04-11 DIAGNOSIS — N201 Calculus of ureter: Secondary | ICD-10-CM | POA: Insufficient documentation

## 2019-04-11 DIAGNOSIS — R05 Cough: Secondary | ICD-10-CM | POA: Insufficient documentation

## 2019-04-11 DIAGNOSIS — Z87891 Personal history of nicotine dependence: Secondary | ICD-10-CM | POA: Insufficient documentation

## 2019-04-11 DIAGNOSIS — N200 Calculus of kidney: Secondary | ICD-10-CM | POA: Diagnosis not present

## 2019-04-11 DIAGNOSIS — N23 Unspecified renal colic: Secondary | ICD-10-CM

## 2019-04-11 DIAGNOSIS — R112 Nausea with vomiting, unspecified: Secondary | ICD-10-CM | POA: Diagnosis present

## 2019-04-11 LAB — CBC
HCT: 40.7 % (ref 39.0–52.0)
HCT: 39.2 % (ref 39.0–52.0)
Hemoglobin: 12.8 g/dL — ABNORMAL LOW (ref 13.0–17.0)
Hemoglobin: 12.4 g/dL — ABNORMAL LOW (ref 13.0–17.0)
MCH: 32.4 pg (ref 26.0–34.0)
MCH: 32.4 pg (ref 26.0–34.0)
MCHC: 31.4 g/dL (ref 30.0–36.0)
MCHC: 31.6 g/dL (ref 30.0–36.0)
MCV: 103 fL — ABNORMAL HIGH (ref 80.0–100.0)
MCV: 102.3 fL — ABNORMAL HIGH (ref 80.0–100.0)
Platelets: 285 10*3/uL (ref 150–400)
Platelets: 283 10*3/uL (ref 150–400)
RBC: 3.95 MIL/uL — ABNORMAL LOW (ref 4.22–5.81)
RBC: 3.83 MIL/uL — ABNORMAL LOW (ref 4.22–5.81)
RDW: 14 % (ref 11.5–15.5)
RDW: 14.1 % (ref 11.5–15.5)
WBC: 9.9 10*3/uL (ref 4.0–10.5)
WBC: 10.4 10*3/uL (ref 4.0–10.5)
nRBC: 0 % (ref 0.0–0.2)
nRBC: 0 % (ref 0.0–0.2)

## 2019-04-11 LAB — DIFFERENTIAL
Abs Immature Granulocytes: 0.03 10*3/uL (ref 0.00–0.07)
Basophils Absolute: 0 10*3/uL (ref 0.0–0.1)
Basophils Relative: 0 %
Eosinophils Absolute: 0.4 10*3/uL (ref 0.0–0.5)
Eosinophils Relative: 3 %
Immature Granulocytes: 0 %
Lymphocytes Relative: 13 %
Lymphs Abs: 1.3 10*3/uL (ref 0.7–4.0)
Monocytes Absolute: 0.5 10*3/uL (ref 0.1–1.0)
Monocytes Relative: 5 %
Neutro Abs: 8.2 10*3/uL — ABNORMAL HIGH (ref 1.7–7.7)
Neutrophils Relative %: 79 %

## 2019-04-11 LAB — URINALYSIS, ROUTINE W REFLEX MICROSCOPIC
Bilirubin Urine: NEGATIVE
Glucose, UA: NEGATIVE mg/dL
Ketones, ur: NEGATIVE mg/dL
Nitrite: NEGATIVE
Protein, ur: 100 mg/dL — AB
RBC / HPF: >50 RBC/hpf — ABNORMAL HIGH (ref 0–5)
Specific Gravity, Urine: 1.019 (ref 1.005–1.030)
WBC, UA: >50 WBC/hpf — ABNORMAL HIGH (ref 0–5)
pH: 5 (ref 5.0–8.0)

## 2019-04-11 LAB — COMPREHENSIVE METABOLIC PANEL
ALT: 15 U/L (ref 0–44)
AST: 20 U/L (ref 15–41)
Albumin: 2.9 g/dL — ABNORMAL LOW (ref 3.5–5.0)
Alkaline Phosphatase: 88 U/L (ref 38–126)
Anion gap: 10 (ref 5–15)
BUN: 10 mg/dL (ref 6–20)
CO2: 24 mmol/L (ref 22–32)
Calcium: 9.6 mg/dL (ref 8.9–10.3)
Chloride: 108 mmol/L (ref 98–111)
Creatinine, Ser: 0.53 mg/dL — ABNORMAL LOW (ref 0.61–1.24)
GFR calc Af Amer: >60 mL/min (ref >60)
GFR calc non Af Amer: >60 mL/min (ref >60)
Glucose, Bld: 91 mg/dL (ref 70–99)
Potassium: 3.2 mmol/L — ABNORMAL LOW (ref 3.5–5.1)
Sodium: 142 mmol/L (ref 135–145)
Total Bilirubin: 0.6 mg/dL (ref 0.3–1.2)
Total Protein: 7 g/dL (ref 6.5–8.1)

## 2019-04-11 LAB — SEDIMENTATION RATE: Sed Rate: 37 mm/hr — ABNORMAL HIGH (ref 0–16)

## 2019-04-11 LAB — C-REACTIVE PROTEIN: CRP: 2.4 mg/dL — ABNORMAL HIGH (ref <1.0)

## 2019-04-11 LAB — LIPASE, BLOOD: Lipase: 18 U/L (ref 11–51)

## 2019-04-11 MED ORDER — KETOROLAC TROMETHAMINE 15 MG/ML IJ SOLN
15.0000 mg | Freq: Once | INTRAMUSCULAR | Status: AC
  Administered 2019-04-11: 15 mg via INTRAVENOUS
  Filled 2019-04-11: qty 1

## 2019-04-11 MED ORDER — HYDROCODONE-ACETAMINOPHEN 5-325 MG PO TABS
1.0000 | ORAL_TABLET | Freq: Once | ORAL | Status: AC
  Administered 2019-04-11: 1 via ORAL
  Filled 2019-04-11: qty 1

## 2019-04-11 MED ORDER — HYDROMORPHONE HCL 1 MG/ML IJ SOLN: 1.0000 mg | Freq: Once | INTRAMUSCULAR | Status: DC | PRN

## 2019-04-11 MED ORDER — ONDANSETRON HCL 4 MG/2ML IJ SOLN: 4.0000 mg | Freq: Four times a day (QID) | INTRAMUSCULAR | Status: DC | PRN

## 2019-04-11 MED ORDER — FENTANYL CITRATE (PF) 100 MCG/2ML IJ SOLN
50.0000 ug | Freq: Once | INTRAMUSCULAR | Status: AC
  Administered 2019-04-11: 50 ug via INTRAVENOUS
  Filled 2019-04-11: qty 2

## 2019-04-11 MED ORDER — ONDANSETRON HCL 4 MG/2ML IJ SOLN
INTRAMUSCULAR | Status: AC
  Filled 2019-04-11: qty 2

## 2019-04-11 MED ORDER — SODIUM CHLORIDE 0.9 % IV BOLUS
500.0000 mL | Freq: Once | INTRAVENOUS | Status: AC
  Administered 2019-04-11: 500 mL via INTRAVENOUS

## 2019-04-11 MED ORDER — ONDANSETRON HCL 4 MG/2ML IJ SOLN
4.0000 mg | Freq: Once | INTRAMUSCULAR | Status: AC | PRN
  Administered 2019-04-11: 4 mg via INTRAVENOUS
  Filled 2019-04-11: qty 2

## 2019-04-11 NOTE — ED Notes (Signed)
Called ptar for pt  

## 2019-04-11 NOTE — Discharge Instructions (Addendum)
We saw you in the ER for the back pain due to kidney stones. We were able to get your pain is relative control, and we can safely send you home.  Take the meds prescribed. Carry on with the follow-up with urologist as planned. If the pain is unbearable, you start having fevers, chills, and are unable to keep any meds down - then return to the ER.

## 2019-04-11 NOTE — ED Triage Notes (Signed)
Pt to ER for evaluation of cough, nausea, vomiting, and pain to left hip. [facility]. PICC line in place to left arm. Pt unable to sit up in wheelchair.
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
## 2019-04-15 ENCOUNTER — Telehealth: Payer: Self-pay | Admitting: Family Medicine

## 2019-04-15 NOTE — Telephone Encounter (Signed)
See note

## 2019-04-15 NOTE — Telephone Encounter (Unsigned)
Copied from [place] [id number]. Topic: Quick Communication - Home Health Verbal Orders >> Apr 15, 2019  3:23 PM Yvette Rack wrote: Caller/Agency: Levada Dy with [facility]  Callback Number: [phone] Requesting OT/PT/Skilled Nursing/Social Work/Speech Therapy: skilled nursing  Frequency: 1 time a week for 9 weeks

## 2019-04-16 NOTE — Telephone Encounter (Signed)
Notified Levada Dy ok for verbal orders

## 2019-04-17 ENCOUNTER — Other Ambulatory Visit: Payer: Self-pay | Admitting: Urology

## 2019-04-21 ENCOUNTER — Telehealth: Payer: Self-pay | Admitting: Surgical

## 2019-04-21 MED ORDER — DOXYCYCLINE HYCLATE 100 MG PO TABS: 100.0000 mg | ORAL_TABLET | Freq: Two times a day (BID) | ORAL | 0 refills | Status: DC

## 2019-04-21 MED ORDER — METRONIDAZOLE 500 MG PO TABS: 500.0000 mg | ORAL_TABLET | Freq: Three times a day (TID) | ORAL | 0 refills | Status: DC

## 2019-04-21 NOTE — Telephone Encounter (Signed)
Increased drainage ("milky" per wife) and increased pain from left hip. Yesterday this started.   He just stopped cefepime IV on November 1, PICC line removed on 8/1 as well.  Yesterday he was really nauseated. No fever, chills. One episode of vomiting yesterday. Denies any diarrhea. Some cramping yesterday.  Rx for doxy and flagyl sent to pharmacy. Consulted with Dr. Marla Roe. Will check in with patient to follow up.  Informed to call with any further questions or concerns. Go to ED if symptoms worsen or he develops significant vomiting, fever, chills, uncontrolled pain.

## 2019-04-24 ENCOUNTER — Telehealth: Payer: Self-pay | Admitting: *Deleted

## 2019-04-24 DIAGNOSIS — G8921 Chronic pain due to trauma: Secondary | ICD-10-CM

## 2019-04-24 NOTE — Telephone Encounter (Signed)
Patients wife left a message stating that she was told to call when patient was in need of having his pain medication refilled.  This was last done on Oct 13th, 2020 and his procedure/follow up with Dr. Dagoberto Ligas is November 20th

## 2019-04-25 MED ORDER — OXYCODONE-ACETAMINOPHEN 5-325 MG PO TABS: 1.0000 | ORAL_TABLET | ORAL | 0 refills | Status: DC | PRN

## 2019-04-25 MED ORDER — DRONABINOL 2.5 MG PO CAPS: 2.5000 mg | ORAL_CAPSULE | Freq: Two times a day (BID) | ORAL | 0 refills | Status: DC

## 2019-04-25 MED ORDER — FENTANYL 50 MCG/HR TD PT72: 1.0000 | MEDICATED_PATCH | TRANSDERMAL | 0 refills | Status: DC

## 2019-04-25 NOTE — Telephone Encounter (Signed)
Renewed his oxycodone/Percocet #150 and Duragesic patch 50 mcg every 2 days #15 and his Marinol 2.5 mg BID #60  If there's anything else he needs, please let me know.

## 2019-04-26 ENCOUNTER — Encounter: Payer: Self-pay | Admitting: Surgical

## 2019-04-26 ENCOUNTER — Other Ambulatory Visit (HOSPITAL_COMMUNITY)
Admission: RE | Admit: 2019-04-26 | Discharge: 2019-04-26 | Disposition: A | Discharge status: Home / Self Care | Payer: BC Managed Care – PPO | Source: Ambulatory Visit | Attending: Surgical | Admitting: Surgical

## 2019-04-26 ENCOUNTER — Other Ambulatory Visit: Payer: Self-pay

## 2019-04-26 ENCOUNTER — Ambulatory Visit (INDEPENDENT_AMBULATORY_CARE_PROVIDER_SITE_OTHER): Payer: No Typology Code available for payment source | Admitting: Surgical

## 2019-04-26 VITALS — BP 103/71 | HR 72 | Temp 98.3°F | Wt 130.0 lb

## 2019-04-26 DIAGNOSIS — S31809D Unspecified open wound of unspecified buttock, subsequent encounter: Secondary | ICD-10-CM

## 2019-04-26 NOTE — Progress Notes (Signed)
   Subjective:     Patient ID: Christopher [name], male    DOB: 1965-11-24, 53 y.o.   MRN: [id number]  Chief Complaint  Patient presents with  . Follow-up   HPI: The patient is a 53 y.o. male here for follow-up on Christopher multiple wounds, excision of left hip wound and placement of ACell on 9/21, 2020 by Christopher [name].   Received a call this past weekend from Christopher [name] and Christopher [name] that he was starting to get creased pain in Christopher left hip.  This started 7 days after Christopher PICC line was removed and Christopher last IV antibiotic dose (04/14/19)  He has not had any fevers, chills, but he has been very nauseous and had an episode of vomiting over the weekend.  The are unsure if the nausea was associated with Christopher kidney stone, antibiotic use or pain, possibly a combination of all factors.  Christopher left hip wound is improving, it is smaller in diameter and depth, but drainage has increased as well as pain. He feels weaker than normal.  He is currently taking flagyl and doxycycline daily.   Review of Systems  Constitutional: Positive for activity change, appetite change and fatigue. Negative for chills, diaphoresis and fever.  Respiratory: Negative.   Cardiovascular: Negative for chest pain, palpitations and leg swelling.  Gastrointestinal: Negative for abdominal pain.  Genitourinary: Positive for flank pain.  Musculoskeletal: Positive for arthralgias, gait problem and myalgias. Negative for neck pain and neck stiffness.  Skin: Positive for color change and wound.  Psychiatric/Behavioral: Negative.      Objective:   Vital Signs BP 103/71 (BP Location: Right Leg, Patient Position: Sitting, Cuff Size: Normal)   Pulse 72   Temp 98.3 F (36.8 C) (Temporal)   Wt 130 lb (59 kg)   SpO2 97%   BMI 16.25 kg/m  Vital Signs and Nursing Note Reviewed  Physical Exam  Constitutional: He is oriented to person, place, and time. He appears distressed (pain when standing).  Thin white male   HENT:  Head:  Normocephalic and atraumatic.  Cardiovascular: Normal rate.  Pulmonary/Chest: Effort normal.  Abdominal: Soft.  Musculoskeletal:        General: Tenderness present. No edema.  Neurological: He is alert and oriented to person, place, and time. Gait normal.  Skin: Skin is warm. No rash noted. He is not diaphoretic. There is erythema. No pallor.     Psychiatric: Mood and affect normal.      Assessment/Plan:     ICD-10-CM   1. Wound of buttock, unspecified laterality, subsequent encounter  S31.809D Aerobic/Anaerobic Culture (surgical/deep wound)   Continue with alginate to gluteal wound. Continue with PO doxy and flagyl. Culture obtained today from L HIP pending sensitivities - change regimen pending results.  Patient scheduled for cystoscopy with litholapaxy on 05/06/19 with Christopher [name], Christopher [name] may benefit from placement of PICC line at that time for IV antibiotics for possible infection/osteomyelitis L hip. He has had increased pain and drainage since d/c of IV abx on 04/14/19. Followed by Christopher [name] for ID. Will message Christopher [name] for opinion and thoughts on plan.  Culture sent to lab today.  Pictures were obtained of the patient and placed in the chart with the patient's or guardian's permission.  Call with questions or concerns. Reasons to go to ED provided. Patient and family verbalized understanding.  Christopher Rhine Cylas Falzone, PA-C 04/26/2019, 4:14 PM

## 2019-04-26 NOTE — Telephone Encounter (Signed)
Patient notified

## 2019-04-27 ENCOUNTER — Emergency Department (HOSPITAL_COMMUNITY)
Admission: EM | Admit: 2019-04-27 | Discharge: 2019-04-27 | Disposition: A | Discharge status: Home / Self Care | Payer: BC Managed Care – PPO | Attending: Emergency Medicine | Admitting: Emergency Medicine

## 2019-04-27 ENCOUNTER — Emergency Department (HOSPITAL_COMMUNITY): Payer: BC Managed Care – PPO

## 2019-04-27 ENCOUNTER — Encounter (HOSPITAL_COMMUNITY): Payer: Self-pay | Admitting: *Deleted

## 2019-04-27 ENCOUNTER — Other Ambulatory Visit: Payer: Self-pay

## 2019-04-27 DIAGNOSIS — R112 Nausea with vomiting, unspecified: Secondary | ICD-10-CM | POA: Diagnosis not present

## 2019-04-27 DIAGNOSIS — Z87891 Personal history of nicotine dependence: Secondary | ICD-10-CM | POA: Diagnosis not present

## 2019-04-27 DIAGNOSIS — R109 Unspecified abdominal pain: Secondary | ICD-10-CM | POA: Diagnosis not present

## 2019-04-27 DIAGNOSIS — Z79899 Other long term (current) drug therapy: Secondary | ICD-10-CM | POA: Insufficient documentation

## 2019-04-27 LAB — BASIC METABOLIC PANEL
Anion gap: 9 (ref 5–15)
BUN: 9 mg/dL (ref 6–20)
CO2: 21 mmol/L — ABNORMAL LOW (ref 22–32)
Calcium: 9.5 mg/dL (ref 8.9–10.3)
Chloride: 106 mmol/L (ref 98–111)
Creatinine, Ser: 0.55 mg/dL — ABNORMAL LOW (ref 0.61–1.24)
GFR calc Af Amer: >60 mL/min (ref >60)
GFR calc non Af Amer: >60 mL/min (ref >60)
Glucose, Bld: 118 mg/dL — ABNORMAL HIGH (ref 70–99)
Potassium: 4.3 mmol/L (ref 3.5–5.1)
Sodium: 136 mmol/L (ref 135–145)

## 2019-04-27 LAB — URINALYSIS, ROUTINE W REFLEX MICROSCOPIC
Bilirubin Urine: NEGATIVE
Glucose, UA: NEGATIVE mg/dL
Ketones, ur: 5 mg/dL — AB
Nitrite: NEGATIVE
Protein, ur: 30 mg/dL — AB
RBC / HPF: >50 RBC/hpf — ABNORMAL HIGH (ref 0–5)
Specific Gravity, Urine: >1.046 — ABNORMAL HIGH (ref 1.005–1.030)
WBC, UA: >50 WBC/hpf — ABNORMAL HIGH (ref 0–5)
pH: 6 (ref 5.0–8.0)

## 2019-04-27 LAB — C-REACTIVE PROTEIN: CRP: 1.2 mg/dL — ABNORMAL HIGH (ref <1.0)

## 2019-04-27 LAB — CBC WITH DIFFERENTIAL/PLATELET
Abs Immature Granulocytes: 0.01 10*3/uL (ref 0.00–0.07)
Basophils Absolute: 0 10*3/uL (ref 0.0–0.1)
Basophils Relative: 0 %
Eosinophils Absolute: 0.1 10*3/uL (ref 0.0–0.5)
Eosinophils Relative: 1 %
HCT: 43.7 % (ref 39.0–52.0)
Hemoglobin: 13.9 g/dL (ref 13.0–17.0)
Immature Granulocytes: 0 %
Lymphocytes Relative: 16 %
Lymphs Abs: 0.8 10*3/uL (ref 0.7–4.0)
MCH: 32.2 pg (ref 26.0–34.0)
MCHC: 31.8 g/dL (ref 30.0–36.0)
MCV: 101.2 fL — ABNORMAL HIGH (ref 80.0–100.0)
Monocytes Absolute: 0.2 10*3/uL (ref 0.1–1.0)
Monocytes Relative: 5 %
Neutro Abs: 3.9 10*3/uL (ref 1.7–7.7)
Neutrophils Relative %: 78 %
Platelets: 294 10*3/uL (ref 150–400)
RBC: 4.32 MIL/uL (ref 4.22–5.81)
RDW: 13.3 % (ref 11.5–15.5)
WBC: 5 10*3/uL (ref 4.0–10.5)
nRBC: 0 % (ref 0.0–0.2)

## 2019-04-27 LAB — SEDIMENTATION RATE: Sed Rate: 32 mm/hr — ABNORMAL HIGH (ref 0–16)

## 2019-04-27 MED ORDER — HYDROMORPHONE HCL 1 MG/ML IJ SOLN
1.0000 mg | Freq: Once | INTRAMUSCULAR | Status: AC
  Administered 2019-04-27: 1 mg via INTRAVENOUS
  Filled 2019-04-27: qty 1

## 2019-04-27 MED ORDER — ONDANSETRON 4 MG PO TBDP
4.0000 mg | ORAL_TABLET | Freq: Once | ORAL | Status: AC
  Administered 2019-04-27: 4 mg via ORAL
  Filled 2019-04-27: qty 1

## 2019-04-27 MED ORDER — METHOCARBAMOL 1000 MG/10ML IJ SOLN
1000.0000 mg | Freq: Once | INTRAVENOUS | Status: AC
  Administered 2019-04-27: 1000 mg via INTRAVENOUS
  Filled 2019-04-27: qty 10

## 2019-04-27 MED ORDER — ONDANSETRON HCL 4 MG/2ML IJ SOLN
4.0000 mg | Freq: Once | INTRAMUSCULAR | Status: AC
  Administered 2019-04-27: 4 mg via INTRAVENOUS
  Filled 2019-04-27: qty 2

## 2019-04-27 MED ORDER — IOHEXOL 300 MG/ML  SOLN
100.0000 mL | Freq: Once | INTRAMUSCULAR | Status: AC | PRN
  Administered 2019-04-27: 100 mL via INTRAVENOUS

## 2019-04-27 MED ORDER — METHOCARBAMOL 1000 MG/10ML IJ SOLN: 1000.0000 mg | Freq: Once | INTRAMUSCULAR | Status: DC

## 2019-04-27 MED ORDER — CEPHALEXIN 500 MG PO CAPS: 500.0000 mg | ORAL_CAPSULE | Freq: Three times a day (TID) | ORAL | 0 refills | Status: DC
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
## 2019-04-27 NOTE — ED Notes (Signed)
Called [facility] for transport home--Christopher [name] 

## 2019-04-27 NOTE — ED Triage Notes (Signed)
EDP  Informed pt has a fentanyl 50 mcg patch on Lt arm.
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
## 2019-04-27 NOTE — Discharge Instructions (Addendum)
You were evaluated in the Emergency Department and after careful evaluation, we did not find any emergent condition requiring admission or further testing in the hospital.  Please take the antibiotics as directed and follow-up closely with your urologist.  Please return to the Emergency Department if you experience any worsening of your condition.  We encourage you to follow up with a primary care provider.  Thank you for allowing [place] to be a part of your care.

## 2019-04-27 NOTE — ED Provider Notes (Signed)
  Provider Note MRN:  [id number]  Arrival date & time: 04/27/19    ED Course and Medical Decision Making  Assumed care from Dr. Roslynn Amble at shift change.  Complicated osteomyelitis history, evaluation with regard to that condition overall reassuring today, has ID follow-up.  Considering kidney stone as a contributor to emesis and symptoms, waiting CT scan and urinalysis.  Candidate for discharge if no significant findings.  CT is showing a perinephric fluid collection, urinalysis showing signs of infection, will discuss with urology.  Spoke with Dr. Ruby Cola of urology, who was able to reviewed the CT findings.  It is reassuring the patient has no fever, normal vital signs.  No indication for admission.  Patient has a preadmission already established with urology and 10 days.  The CT findings to warrant antibiotic coverage.  Providing prescription for Keflex.  Strict return precautions for fever.  Procedures  Final Clinical Impressions(s) / ED Diagnoses     ICD-10-CM   1. Non-intractable vomiting with nausea, unspecified vomiting type  R11.2   2. Abdominal pain, unspecified abdominal location  R10.9     ED Discharge Orders         Ordered    cephALEXin (KEFLEX) 500 MG capsule  3 times daily     04/27/19 1836            Discharge Instructions     You were evaluated in the Emergency Department and after careful evaluation, we did not find any emergent condition requiring admission or further testing in the hospital.  Please take the antibiotics as directed and follow-up closely with your urologist.  Please return to the Emergency Department if you experience any worsening of your condition.  We encourage you to follow up with a primary care provider.  Thank you for allowing [place] to be a part of your care.     Barth Kirks. Sedonia Small, [place] [email or web address]    Maudie Flakes, MD 04/27/19 [phone]

## 2019-04-27 NOTE — ED Triage Notes (Signed)
PT needs IV Robaxin before DC

## 2019-04-29 ENCOUNTER — Other Ambulatory Visit: Payer: Self-pay | Admitting: Surgical

## 2019-04-29 ENCOUNTER — Telehealth: Payer: Self-pay

## 2019-04-29 MED ORDER — CEPHALEXIN 500 MG PO CAPS: 500.0000 mg | ORAL_CAPSULE | Freq: Four times a day (QID) | ORAL | 0 refills | Status: AC

## 2019-04-29 NOTE — Telephone Encounter (Signed)
Called and spoke with the patient's wife and informed her per [facility] patient is to take the Keflex only, and he will be sending in more of the Keflex to the pharmacy.  He asked if they will keep [place] updated.  The wife verbalized understanding and agreed.//AB/CMA

## 2019-04-29 NOTE — Progress Notes (Signed)
Patient to take Keflex 500mg  QID for E coli infection s/p culture and sensitivity. Spoke with ID who recommended keflex due to sensitivity to ancef on culture. Patient currently on flagyl and doxy, d/c'ed.  IV antibiotics likely not needed due to wound improving and recent CT showing no osteomyelitis.  Patient received keflex 500 TID from ED visit, increase to QID, sent in extra to bridge for total of 14 days of coverage.

## 2019-04-29 NOTE — Telephone Encounter (Signed)
Patient was seen in ER on Saturday due to increased pain and nausea. ED recommended keflex and sent patient home with same. Patient is already taking doxycycline from clinic prescription. Should patient be taking both prescriptions? Please advise.

## 2019-05-01 LAB — AEROBIC/ANAEROBIC CULTURE W GRAM STAIN (SURGICAL/DEEP WOUND)

## 2019-05-02 ENCOUNTER — Encounter (HOSPITAL_BASED_OUTPATIENT_CLINIC_OR_DEPARTMENT_OTHER): Payer: Self-pay | Admitting: *Deleted

## 2019-05-02 ENCOUNTER — Other Ambulatory Visit: Payer: Self-pay

## 2019-05-02 ENCOUNTER — Other Ambulatory Visit (HOSPITAL_COMMUNITY)
Admission: RE | Admit: 2019-05-02 | Discharge: 2019-05-02 | Disposition: A | Discharge status: Home / Self Care | Payer: BC Managed Care – PPO | Source: Ambulatory Visit | Attending: Urology | Admitting: Urology

## 2019-05-02 DIAGNOSIS — Z01812 Encounter for preprocedural laboratory examination: Secondary | ICD-10-CM | POA: Diagnosis present

## 2019-05-02 DIAGNOSIS — Z20828 Contact with and (suspected) exposure to other viral communicable diseases: Secondary | ICD-10-CM | POA: Diagnosis not present

## 2019-05-02 LAB — CULTURE, BLOOD (ROUTINE X 2)
Culture: NO GROWTH
Culture: NO GROWTH
Special Requests: ADEQUATE

## 2019-05-02 NOTE — Progress Notes (Addendum)
Spoke w/ via phone for pre-op interview---wife melanie Lab needs dos----   none           Lab results------ COVID test ------05-12-2019 Arrive at -------1200 PM NPO after ------MIDNIGHT Medications to take morning of surgery ----- Diabetic medication -----FENTANYL PATCH, OXYCODONE PRN, ALBUTEROL INHALER PRN AND BRING INHALER, CLONAZEPAM PRN, CEPHALAXIN, OMEPRAZOLE Patient Special Instructions ----- Pre-Op special Istructions ----- Patient verbalized understanding of instructions that were given at this phone interview. Patient denies shortness of breath, chest pain, fever, cough a this phone interview.  Anesthesia Review: secure chat routed to Janett Billow zanetto pa for review  PCP: dr Dimas Chyle Plastic surgery dr Wilfred Curtis [place] Trauma surgery dr Georganna Skeans Cardiologist :none Chest x-ray :04-11-19 chart/epic, with trach on ventilator from jan 2020 to march 2020 trach removed 11-16-18, patient breathes well on own uses albuterol inhaler prn EKG :04-27-2019 chart/epic Echo :none Cardiac Cath : none Sleep Study/ CPAP :none Fasting Blood Sugar :      / Checks Blood Sugar -- times a day:  n/a Blood Thinner/ Instructions /Last Dose:n/a ASA / Instructions/ Last Dose : n/a  Patient denies shortness of breath, chest pain, fever, and cough at this phone interview.  Spoke with beverly harrelson rn patient ok for transport home in worker comp ven and wife melanie to follow [name]. Patient is in wheel chair stands up with 1 person assistance

## 2019-05-03 ENCOUNTER — Encounter: Payer: Self-pay | Admitting: Physical Medicine and Rehabilitation

## 2019-05-03 ENCOUNTER — Encounter
Payer: No Typology Code available for payment source | Attending: Physical Medicine and Rehabilitation | Admitting: Physical Medicine and Rehabilitation

## 2019-05-03 VITALS — BP 105/72 | HR 70

## 2019-05-03 DIAGNOSIS — M792 Neuralgia and neuritis, unspecified: Secondary | ICD-10-CM

## 2019-05-03 DIAGNOSIS — R5381 Other malaise: Secondary | ICD-10-CM | POA: Diagnosis present

## 2019-05-03 DIAGNOSIS — Z9359 Other cystostomy status: Secondary | ICD-10-CM

## 2019-05-03 DIAGNOSIS — R112 Nausea with vomiting, unspecified: Secondary | ICD-10-CM

## 2019-05-03 DIAGNOSIS — Z79891 Long term (current) use of opiate analgesic: Secondary | ICD-10-CM | POA: Diagnosis not present

## 2019-05-03 DIAGNOSIS — G894 Chronic pain syndrome: Secondary | ICD-10-CM

## 2019-05-03 DIAGNOSIS — Z5181 Encounter for therapeutic drug level monitoring: Secondary | ICD-10-CM

## 2019-05-03 DIAGNOSIS — Z933 Colostomy status: Secondary | ICD-10-CM

## 2019-05-03 LAB — NOVEL CORONAVIRUS, NAA (HOSP ORDER, SEND-OUT TO REF LAB; TAT 18-24 HRS): SARS-CoV-2, NAA: NOT DETECTED

## 2019-05-03 MED ORDER — ONDANSETRON HCL 4 MG PO TABS: 4.0000 mg | ORAL_TABLET | Freq: Three times a day (TID) | ORAL | 5 refills | Status: DC | PRN

## 2019-05-03 MED ORDER — LIDOCAINE 5 % EX PTCH: 3.0000 | MEDICATED_PATCH | Freq: Two times a day (BID) | CUTANEOUS | 5 refills | Status: DC

## 2019-05-03 NOTE — Progress Notes (Signed)
  Pt is a 54 yr old male with hx of Multitrauma- causing L femoral neck fx, degloving of L hip to going/scrotum, bladder neck trauma- got SPC, developed compartment syndrome -s/p surgery for that; also diverting colostomy, skin grafts, and s/p trachand PEG- PEG is out. 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 S/P screw removal and on IV ABX for L hip osteomyelitis. 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   Since last visit, been to ER for intractable N/V- still an issue now. Has cystoscopy scheduled 05/06/2019 For L ureteral stone next week.  Had nephrostomy drain- didn't drain past bladder. Could have had another stone move this weekend, had so MUCh more pain.  Plan is to use radiofrequency to break up stones and get them out of bladder via suprapubic next week.  Has compazine for nausea- no Zofran- zofran has worked well in past for him  Urine has been really dark/bloody- with associated nausea- back and forth for last month.  ROS- an entire ROS was completed and negative except for HPI.    Exam: Awake, alert, appropriate, sitting up in power w/c, accompanied by wife, sitting on R side, not balanced in W/C  Still hurts over Low left back- very Tender and swollen appearing- puffy, but not as bad as when had osteomyelitis of L hip colostomy putting out stool Very tight trigger points in upper neck and upper back/shoulders, esp on L side. Per injections.   Patient here for trigger point injections for  Consent done and on chart.  Cleaned areas with alcohol and injected using a 27 gauge 1.5 inch needle  Injected 3cc total Using 1% Lidocaine with no EPI  Upper traps B/L Levators B/L Posterior scalenes B/L- wheel done on L scalene due to a jump Middle scalenes Splenius Capitus Pectoralis Major Rhomboids B/L Infraspinatus Teres Major/minor Thoracic paraspinals Lumbar paraspinals Other injections-     Patient's level of pain prior was- pain around the same 1/10 but has more ROM of shoulders Current level of pain after injections is 0-1/10  There was no bleeding or complications.  Patient was advised to drink a lot of water on day after injections to flush system Will have increased soreness for 12-48 hours after injections.  Can use Lidocaine patches the day AFTER injections Can use theracane on day of injections in places didn't inject Can use heating pad 4-6 hours AFTER injections   Is not due to Oxycodone or Durgesic patches currently.  - will give Rx for Zofran 4-8 mg max 24 mg/day- sent 120 tabs of 4 mg to pharmacy  - feeling better except in L lower back-   -discussed that ESR/CRP are not appropriate right now because of bladder stone- so can't tell why lab is elevated.  -Rx for lidocaine patches- max 3 patches 12 hrs on;12 hrs off.    I spent a total of 50 minutes on appointment- 10 minutes on trigger point injections; more than 25 minutes going over as below - verifying getting shoes from [employer], discussing pain med options- if has exacerbation this weekend, let me know, so I can plan for it in med regimen; also wrote for Zofran for nausea and how to zofran- 20% of cases causes constipation;

## 2019-05-03 NOTE — Addendum Note (Signed)
Addended by: Jasmine December T on: 05/03/2019 02:31 PM   Modules accepted: Orders

## 2019-05-06 ENCOUNTER — Ambulatory Visit (HOSPITAL_BASED_OUTPATIENT_CLINIC_OR_DEPARTMENT_OTHER): Payer: BC Managed Care – PPO | Admitting: Physician Assistant

## 2019-05-06 ENCOUNTER — Ambulatory Visit (HOSPITAL_BASED_OUTPATIENT_CLINIC_OR_DEPARTMENT_OTHER)
Admission: RE | Admit: 2019-05-06 | Discharge: 2019-05-06 | Disposition: A | Discharge status: Home / Self Care | Payer: BC Managed Care – PPO | Attending: Urology | Admitting: Urology

## 2019-05-06 ENCOUNTER — Encounter (HOSPITAL_BASED_OUTPATIENT_CLINIC_OR_DEPARTMENT_OTHER): Payer: Self-pay | Admitting: Anesthesiology

## 2019-05-06 ENCOUNTER — Ambulatory Visit: Payer: BC Managed Care – PPO | Admitting: Physical Medicine and Rehabilitation

## 2019-05-06 ENCOUNTER — Encounter (HOSPITAL_BASED_OUTPATIENT_CLINIC_OR_DEPARTMENT_OTHER)
Admission: RE | Disposition: A | Discharge status: Home / Self Care | Payer: Self-pay | Source: Home / Self Care | Attending: Urology

## 2019-05-06 ENCOUNTER — Other Ambulatory Visit: Payer: Self-pay

## 2019-05-06 DIAGNOSIS — Z87828 Personal history of other (healed) physical injury and trauma: Secondary | ICD-10-CM | POA: Insufficient documentation

## 2019-05-06 DIAGNOSIS — N35014 Post-traumatic urethral stricture, male, unspecified: Secondary | ICD-10-CM | POA: Diagnosis not present

## 2019-05-06 DIAGNOSIS — N201 Calculus of ureter: Secondary | ICD-10-CM | POA: Insufficient documentation

## 2019-05-06 DIAGNOSIS — N210 Calculus in bladder: Secondary | ICD-10-CM | POA: Insufficient documentation

## 2019-05-06 DIAGNOSIS — Z87891 Personal history of nicotine dependence: Secondary | ICD-10-CM | POA: Diagnosis not present

## 2019-05-06 DIAGNOSIS — Z933 Colostomy status: Secondary | ICD-10-CM | POA: Diagnosis not present

## 2019-05-06 HISTORY — PX: CYSTOSCOPY WITH LITHOLAPAXY: SHX1425

## 2019-05-06 HISTORY — DX: Other injury of unspecified body region, initial encounter: T14.8XXA

## 2019-05-06 HISTORY — PX: CYSTOSTOMY: SHX155

## 2019-05-06 SURGERY — CYSTOSCOPY, WITH BLADDER CALCULUS LITHOLAPAXY
Anesthesia: General

## 2019-05-06 MED ORDER — CEFAZOLIN SODIUM-DEXTROSE 2-4 GM/100ML-% IV SOLN
INTRAVENOUS | Status: AC
  Filled 2019-05-06: qty 100

## 2019-05-06 MED ORDER — CEFAZOLIN SODIUM-DEXTROSE 2-4 GM/100ML-% IV SOLN
2.0000 g | INTRAVENOUS | Status: AC
  Administered 2019-05-06: 2 g via INTRAVENOUS
  Filled 2019-05-06: qty 100

## 2019-05-06 MED ORDER — OXYCODONE HCL 5 MG/5ML PO SOLN
5.0000 mg | Freq: Once | ORAL | Status: DC | PRN
  Filled 2019-05-06: qty 5

## 2019-05-06 MED ORDER — ACETAMINOPHEN 325 MG PO TABS
325.0000 mg | ORAL_TABLET | Freq: Once | ORAL | Status: DC | PRN
  Filled 2019-05-06: qty 2

## 2019-05-06 MED ORDER — FENTANYL CITRATE (PF) 100 MCG/2ML IJ SOLN
INTRAMUSCULAR | Status: AC
  Filled 2019-05-06: qty 2

## 2019-05-06 MED ORDER — SUCCINYLCHOLINE CHLORIDE 20 MG/ML IJ SOLN
INTRAMUSCULAR | Status: DC | PRN
  Administered 2019-05-06: 120 mg via INTRAVENOUS

## 2019-05-06 MED ORDER — LIDOCAINE 2% (20 MG/ML) 5 ML SYRINGE
INTRAMUSCULAR | Status: AC
  Filled 2019-05-06: qty 5

## 2019-05-06 MED ORDER — LACTATED RINGERS IV SOLN
INTRAVENOUS | Status: DC | PRN
  Administered 2019-05-06: 13:00:00 via INTRAVENOUS

## 2019-05-06 MED ORDER — DIPHENHYDRAMINE HCL 50 MG/ML IJ SOLN
25.0000 mg | Freq: Once | INTRAMUSCULAR | Status: DC
  Filled 2019-05-06: qty 0.5

## 2019-05-06 MED ORDER — POTASSIUM CHLORIDE 2 MEQ/ML IV SOLN
INTRAVENOUS | Status: DC
  Filled 2019-05-06: qty 1000

## 2019-05-06 MED ORDER — DEXAMETHASONE SODIUM PHOSPHATE 4 MG/ML IJ SOLN
INTRAMUSCULAR | Status: DC | PRN
  Administered 2019-05-06: 10 mg via INTRAVENOUS

## 2019-05-06 MED ORDER — LACTATED RINGERS IV SOLN
INTRAVENOUS | Status: DC
  Filled 2019-05-06: qty 1000

## 2019-05-06 MED ORDER — FENTANYL CITRATE (PF) 100 MCG/2ML IJ SOLN
25.0000 ug | INTRAMUSCULAR | Status: DC | PRN
  Filled 2019-05-06: qty 1

## 2019-05-06 MED ORDER — ONDANSETRON HCL 4 MG/2ML IJ SOLN
INTRAMUSCULAR | Status: AC
  Filled 2019-05-06: qty 2

## 2019-05-06 MED ORDER — DEXAMETHASONE SODIUM PHOSPHATE 10 MG/ML IJ SOLN
INTRAMUSCULAR | Status: AC
  Filled 2019-05-06: qty 1

## 2019-05-06 MED ORDER — PROPOFOL 10 MG/ML IV BOLUS
INTRAVENOUS | Status: DC | PRN
  Administered 2019-05-06: 130 mg via INTRAVENOUS

## 2019-05-06 MED ORDER — OXYCODONE-ACETAMINOPHEN 5-325 MG PO TABS: 1.0000 | ORAL_TABLET | ORAL | 0 refills | Status: DC | PRN

## 2019-05-06 MED ORDER — LIDOCAINE 2% (20 MG/ML) 5 ML SYRINGE
INTRAMUSCULAR | Status: DC | PRN
  Administered 2019-05-06: 60 mg via INTRAVENOUS

## 2019-05-06 MED ORDER — FENTANYL CITRATE (PF) 100 MCG/2ML IJ SOLN
50.0000 ug | INTRAMUSCULAR | Status: DC | PRN
  Filled 2019-05-06: qty 1

## 2019-05-06 MED ORDER — ONDANSETRON HCL 4 MG/2ML IJ SOLN
4.0000 mg | Freq: Once | INTRAMUSCULAR | Status: DC
  Filled 2019-05-06: qty 2

## 2019-05-06 MED ORDER — ONDANSETRON HCL 4 MG/2ML IJ SOLN
INTRAMUSCULAR | Status: DC | PRN
  Administered 2019-05-06: 4 mg via INTRAVENOUS

## 2019-05-06 MED ORDER — FENTANYL CITRATE (PF) 100 MCG/2ML IJ SOLN
INTRAMUSCULAR | Status: DC | PRN
  Administered 2019-05-06 (×2): 50 ug via INTRAVENOUS

## 2019-05-06 MED ORDER — FENTANYL CITRATE (PF) 100 MCG/2ML IJ SOLN
25.0000 ug | INTRAMUSCULAR | Status: DC | PRN
  Administered 2019-05-06: 25 ug via INTRAVENOUS
  Filled 2019-05-06: qty 1

## 2019-05-06 MED ORDER — ACETAMINOPHEN 160 MG/5ML PO SOLN
325.0000 mg | Freq: Once | ORAL | Status: DC | PRN
  Filled 2019-05-06: qty 20.3

## 2019-05-06 MED ORDER — OXYCODONE HCL 5 MG PO TABS
5.0000 mg | ORAL_TABLET | Freq: Once | ORAL | Status: DC | PRN
  Filled 2019-05-06: qty 1

## 2019-05-06 MED ORDER — PROMETHAZINE HCL 25 MG/ML IJ SOLN
6.2500 mg | INTRAMUSCULAR | Status: DC | PRN
  Filled 2019-05-06: qty 1

## 2019-05-06 MED ORDER — PROPOFOL 10 MG/ML IV BOLUS
INTRAVENOUS | Status: AC
  Filled 2019-05-06: qty 20

## 2019-05-06 MED ORDER — ACETAMINOPHEN 10 MG/ML IV SOLN
1000.0000 mg | Freq: Once | INTRAVENOUS | Status: DC | PRN
  Filled 2019-05-06: qty 100

## 2019-05-06 MED ORDER — ONDANSETRON HCL 4 MG/2ML IJ SOLN
4.0000 mg | Freq: Once | INTRAMUSCULAR | Status: AC
  Administered 2019-05-06: 4 mg via INTRAVENOUS
  Filled 2019-05-06: qty 2

## 2019-05-06 SURGICAL SUPPLY — 22 items
AMPLATZ RENAL DIALATOR SET ×1 IMPLANT
BAG DRAIN URO-CYSTO SKYTR STRL (DRAIN) ×2 IMPLANT
BASKET ZERO TIP NITINOL 2.4FR (BASKET) ×1 IMPLANT
BSKT STON RTRVL ZERO TP 2.4FR (BASKET) ×1
CATH FOLEY 2WAY SLVR  5CC 20FR (CATHETERS) ×1
CATH FOLEY 2WAY SLVR 5CC 20FR (CATHETERS) IMPLANT
CLOTH BEACON ORANGE TIMEOUT ST (SAFETY) ×2 IMPLANT
FIBER LASER FLEXIVA 1000 (UROLOGICAL SUPPLIES) IMPLANT
FIBER LASER FLEXIVA 550 (UROLOGICAL SUPPLIES) IMPLANT
GLOVE BIO SURGEON STRL SZ8 (GLOVE) ×2 IMPLANT
GOWN STRL REUS W/TWL XL LVL3 (GOWN DISPOSABLE) ×2 IMPLANT
GUIDEWIRE STR DUAL SENSOR (WIRE) ×1 IMPLANT
IV NS IRRIG 3000ML ARTHROMATIC (IV SOLUTION) ×2 IMPLANT
KIT TURNOVER CYSTO (KITS) ×2 IMPLANT
MANIFOLD NEPTUNE II (INSTRUMENTS) ×2 IMPLANT
NS IRRIG 500ML POUR BTL (IV SOLUTION) ×2 IMPLANT
PACK CYSTO (CUSTOM PROCEDURE TRAY) ×2 IMPLANT
SET AMPLATZ RENAL DILATOR (MISCELLANEOUS) ×1 IMPLANT
SYR 10ML LL (SYRINGE) ×2 IMPLANT
TUBE CONNECTING 12X1/4 (SUCTIONS) IMPLANT
TUBING UROLOGY SET (TUBING) ×2 IMPLANT
WATER STERILE IRR 3000ML UROMA (IV SOLUTION) IMPLANT

## 2019-05-06 NOTE — Anesthesia Procedure Notes (Addendum)
Procedure Name: Intubation Date/Time: 05/06/2019 1:11 PM Performed by: Effie Berkshire, MD Pre-anesthesia Checklist: Patient identified, Emergency Drugs available, Suction available and Patient being monitored Patient Re-evaluated:Patient Re-evaluated prior to induction Oxygen Delivery Method: Circle system utilized Preoxygenation: Pre-oxygenation with 100% oxygen Induction Type: IV induction Ventilation: Mask ventilation without difficulty Laryngoscope Size: Mac and 3 Grade View: Grade III Tube type: Oral Tube size: 7.0 mm Number of attempts: 1 Airway Equipment and Method: Stylet and Oral airway Placement Confirmation: ETT inserted through vocal cords under direct vision,  positive ETCO2 and breath sounds checked- equal and bilateral Secured at: 22 cm Tube secured with: Tape Dental Injury: Teeth and Oropharynx as per pre-operative assessment

## 2019-05-06 NOTE — Discharge Instructions (Signed)
Suprapubic Catheter Home Guide °A suprapubic catheter is a flexible tube that is used to drain urine from the bladder into a collection bag outside the body. The catheter is inserted into the bladder through a small opening in the lower abdomen, above the pubic bone (suprapubic area) and a few inches below your belly button (navel). A tiny balloon filled with germ-free (sterile) water helps to keep the catheter in place. °The collection bag must be emptied at least once a day and cleaned at least every other day. The collection bag can be put beside your bed at night and attached to your leg during the day. You may have a large collection bag to use at night and a smaller one to use during the day. °Your suprapubic catheter may need to be changed every 4-6 weeks, or as often as recommended by your health care provider. Healing of the tract where the catheter is placed can take 6 weeks to 6 months. During that time, your health care provider may change your catheter. Once the tract is well healed, you or a caregiver will change your suprapubic catheter at home. °What are the risks? °This catheter is safe to use. However, problems can occur, including: °· Blocked urine flow. This can occur if the catheter stops working, or if you have a blood clot in your bladder or in the catheter. °· Irritation of the skin around the catheter. °· Infection. This can happen if bacteria gets into your bladder. °Supplies needed: °· Two pairs of sterile gloves. °· Paper towels. °· Catheter. °· Two syringes. °· Sterile water. °· Sterile cleaning solution. °· Lubricant. °· Collection bags. °How to change the catheter ° °1. Drink plenty of fluids during the hours before you change the catheter. °2. Wash your hands with soap and water. If soap and water are not available, use hand sanitizer. °3. Draw up sterile water into a syringe to have ready to fill the new catheter balloon. The amount will depend on the size of the balloon. °4. Have  all of your supplies ready and close to you on a paper towel. °5. Lie on your back, sitting slightly upright so that you can see the catheter and opening. °6. Put on sterile gloves. °7. Clean the skin around the catheter opening using the sterile cleaning solution. °8. Remove the water from the balloon in the catheter using a syringe. °9. Slowly remove the catheter. If the catheter seems stuck, or if you have difficulty removing it: °? Do not pull on it. °? Call your health care provider right away. °10. Place the old catheter on a paper towel to discard later. °11. Take off the used gloves, and put on a new pair. °12. Put lubricant on the end of the new catheter that will go into your bladder. °13. Clean the skin around the catheter opening using the sterile cleaning solution. °14. Gently slide the catheter through the opening in your abdomen and into the tract that leads to your bladder. °15. Wait for some urine to start flowing through the catheter. °16. When urine starts to flow through the catheter, attach the collection bag to the end of the catheter. Make sure the connection is tight. °17. Use a syringe to fill the catheter balloon with sterile water. Fill to the amount directed by your health care provider. °18. Remove the gloves and wash your hands with soap and water. °How to care for the skin around the catheter °Follow your health care provider's instructions on   caring for your skin. °· Use a clean washcloth and soapy water to clean the skin around your catheter every day. Pat the area dry with a clean paper towel. °· Do not pull on the catheter. °· Do not use ointment or lotion on this area, unless told by your health care provider. °· Check the skin around the catheter every day for signs of infection. Check for: °? Redness, swelling, or pain. °? Fluid or blood. °? Warmth. °? Pus or a bad smell. °How to empty and clean the collection bag °Empty the large collection bag every 8 hours. Empty the small  collection bag when it is about ? full. Clean the collection bag every 2-3 days, or as often as told by your health care provider. To do this: °1. Wash your hands with soap and water. If soap and water are not available, use hand sanitizer. °2. Disconnect the bag from the catheter and immediately attach a new bag to the catheter. °3. Hold the used bag over the toilet or another container. °4. Turn the valve (spigot) at the bottom of the bag to empty the urine. Empty the used bag completely. °? Do not touch the opening of the spigot. °? Do not let the opening touch the toilet or container. °5. Close the spigot tightly when the bag is empty. °6. Clean the used bag in one of the following methods: °? According to the manufacturer's instructions. °? As told by your health care provider. °7. Let the bag dry completely. Put it in a clean plastic bag before storing it. °General tips ° °· Always wash your hands before and after caring for your catheter and collection bag. Use a mild, fragrance-free soap. If soap and water are not available, use hand sanitizer. °· Clean the outside of the catheter with soap and water as often as told by your health care provider. °· Always make sure there are no twists or kinks in the catheter tube. °· Always make sure there are no leaks in the catheter or collection bag. °· Always wear the leg bag below your knee. °· Make sure the overnight drainage bag is always lower than the level of your bladder, but do not let it touch the floor. Before you go to sleep, hang the bag inside a wastebasket that is covered by a clean plastic bag. °· Drink enough fluid to keep your urine pale yellow. °· Do not take baths, swim, or use a hot tub until your health care provider approves. Ask your health care provider if you may take showers. °Contact a heath care provider if: °· You leak urine. °· You have redness, swelling, or pain around your catheter. °· You have fluid or blood coming from your catheter  opening. °· Your catheter opening feels warm to the touch. °· You have pus or a bad smell coming from your catheter opening. °· You have a fever or chills. °· Your urine flow slows down. °· Your urine becomes cloudy or smelly. °Get help right away if: °· Your catheter comes out. °· You have: °? Nausea. °? Back pain. °? Difficulty changing your catheter. °? Blood in your urine. °? No urine flow for 1 hour. °Summary °· A suprapubic catheter is a flexible tube that is used to drain urine from the bladder into a collection bag outside the body. °· Your suprapubic catheter may need to be changed every 4-6 weeks, or as recommended by your health care provider. °· Follow instructions on how to   change the catheter and how to empty and clean the collection bag.  Always wash your hands before and after caring for your catheter and collection bag. Drink enough fluid to keep your urine pale yellow.  Get help right away if you have difficulty changing your catheter or if there is blood in your urine. This information is not intended to replace advice given to you by your health care provider. Make sure you discuss any questions you have with your health care provider. Document Released: 02/15/2011 Document Revised: 09/20/2018 Document Reviewed: 07/04/2018 Elsevier Patient Education  [place] Instructions  Activity: Get plenty of rest for the remainder of the day. A responsible individual must stay with you for 24 hours following the procedure.  For the next 24 hours, DO NOT: -Drive a car -Paediatric nurse -Drink alcoholic beverages -Take any medication unless instructed by your physician -Make any legal decisions or sign important papers.  Meals: Start with liquid foods such as gelatin or soup. Progress to regular foods as tolerated. Avoid greasy, spicy, heavy foods. If nausea and/or vomiting occur, drink only clear liquids until the nausea and/or vomiting subsides.  Call your physician if vomiting continues.  Special Instructions/Symptoms: Your throat may feel dry or sore from the anesthesia or the breathing tube placed in your throat during surgery. If this causes discomfort, gargle with warm salt water. The discomfort should disappear within 24 hours.

## 2019-05-06 NOTE — Op Note (Signed)
Preoperative diagnosis: bladder calculi  Postoperative diagnosis: same  Procedure: 1 cystoscopy 2. cystolithalopaxy for a stone less than 2.5cm  Attending: Nicolette Bang  Anesthesia: General  Estimated blood loss: Minimal  Drains: 20 french SP tube  Specimens: 1. Bladder calculus  Antibiotics: ancef  Findings:  Ureteral orifices in normal anatomic location.  Multiple 3-55mm bladder calculi  Indications: Patient is a 53 year old male with a history of pelvic crush injury, urethral stricture and bladder calculi.  After discussing treatment options, they decided proceed with cystolithalopaxy.  Procedure her in detail: The patient was brought to the operating room and a brief timeout was done to ensure correct patient, correct procedure, correct site.  General anesthesia was administered patient was placed in dorsal lithotomy position.  Their abdomen and SP tube were then prepped and draped in usual sterile fashion. The Sp tube was then removed. A rigid 52 French flexible cystoscope was passed through the SP tract and into the bladder.  Bladder was inspected and we noted no masses or lesions.  the ureteral orifices were in the normal orthotopic locations. Using a grasper numerous calculi were removed from the bladder. The stone fragments were the sent for analysis. Once this was complete we dilated the Sp tract from 16 french to 24 french and then placed a 20 french Sp tube. The bladder was then drained and this concluded the procedure which was well tolerated by patient.  Complications: None  Condition: Stable, extubated, transferred to PACU  Plan: Patient is to be discharge home. He will followup in 2 weeks for stone analysis discussion

## 2019-05-06 NOTE — Anesthesia Preprocedure Evaluation (Addendum)
Anesthesia Evaluation  Patient identified by MRN, date of birth, ID band Patient awake    Reviewed: Allergy & Precautions, NPO status , Patient's Chart, lab work & pertinent test results  Airway Mallampati: II  TM Distance: >3 FB Neck ROM: Full    Dental  (+) Poor Dentition, Dental Advisory Given, Chipped,    Pulmonary former smoker,    breath sounds clear to auscultation       Cardiovascular negative cardio ROS   Rhythm:Regular Rate:Tachycardia     Neuro/Psych PSYCHIATRIC DISORDERS Anxiety Depression    GI/Hepatic Neg liver ROS, GERD  Medicated,  Endo/Other  negative endocrine ROS  Renal/GU      Musculoskeletal negative musculoskeletal ROS (+)   Abdominal   Peds  Hematology negative hematology ROS (+)   Anesthesia Other Findings   Reproductive/Obstetrics                          Anesthesia Physical Anesthesia Plan  ASA: III  Anesthesia Plan: General   Post-op Pain Management:    Induction: Intravenous, Rapid sequence and Cricoid pressure planned  PONV Risk Score and Plan: 3 and Ondansetron, Dexamethasone and Midazolam  Airway Management Planned: Oral ETT  Additional Equipment: None  Intra-op Plan:   Post-operative Plan: Extubation in OR  Informed Consent: I have reviewed the patients History and Physical, chart, labs and discussed the procedure including the risks, benefits and alternatives for the proposed anesthesia with the patient or authorized representative who has indicated his/her understanding and acceptance.     Dental advisory given  Plan Discussed with: CRNA  Anesthesia Plan Comments:        Anesthesia Quick Evaluation

## 2019-05-06 NOTE — Interval H&P Note (Signed)
History and Physical Interval Note:  05/06/2019 12:42 PM  [name]  has presented today for surgery, with the diagnosis of BLADDER CALCULI.  The various methods of treatment have been discussed with the patient and family. After consideration of risks, benefits and other options for treatment, the patient has consented to  Procedure(s) with comments: CYSTOSCOPY WITH LITHOLAPAXY (N/A) - 30 MINS CYSTOSTOMY SUPRAPUBIC (N/A) as a surgical intervention.  The patient's history has been reviewed, patient examined, no change in status, stable for surgery.  I have reviewed the patient's chart and labs.  Questions were answered to the patient's satisfaction.     Christopher [name]

## 2019-05-06 NOTE — Anesthesia Procedure Notes (Addendum)
Procedure Name: Intubation Date/Time: 05/06/2019 1:11 PM Performed by: Eulas Post, Debroah Shuttleworth W, CRNA Pre-anesthesia Checklist: Patient identified, Emergency Drugs available, Suction available and Patient being monitored Patient Re-evaluated:Patient Re-evaluated prior to induction Oxygen Delivery Method: Circle system utilized Preoxygenation: Pre-oxygenation with 100% oxygen Induction Type: IV induction Ventilation: Mask ventilation without difficulty Laryngoscope Size: Mac, Sabra Heck, 3 and 2 Grade View: Grade II Tube type: Oral Tube size: 7.0 mm Number of attempts: 2 Airway Equipment and Method: Stylet and Oral airway Placement Confirmation: ETT inserted through vocal cords under direct vision,  positive ETCO2 and breath sounds checked- equal and bilateral Secured at: 24 cm Tube secured with: Tape Dental Injury: Teeth and Oropharynx as per pre-operative assessment  Comments: AC DL x one Mil2, no view. [place] DL x one, Mac 3, grade 3 view, ATOI

## 2019-05-06 NOTE — Transfer of Care (Signed)
Immediate Anesthesia Transfer of Care Note  Patient: Christopher [name]  Procedure(s) Performed: CYSTOSCOPY BASKET BLADDER STONE EXTRACTION (N/A ) REPLACEMENT OF SUPRAPUBIC CATHETER (N/A )  Patient Location: PACU  Anesthesia Type:General  Level of Consciousness: awake  Airway & Oxygen Therapy: Patient Spontanous Breathing and Patient connected to face mask oxygen  Post-op Assessment: Report given to RN and Post -op Vital signs reviewed and stable  Post vital signs: Reviewed and stable  Last Vitals:  Vitals Value Taken Time  BP 147/90 05/06/19 1409  Temp    Pulse 82 05/06/19 1410  Resp 12 05/06/19 1410  SpO2 96 % 05/06/19 1410  Vitals shown include unvalidated device data.  Last Pain:  Vitals:   05/06/19 1243  TempSrc: Oral         Complications: No apparent anesthesia complications

## 2019-05-07 NOTE — Anesthesia Postprocedure Evaluation (Signed)
Anesthesia Post Note  Patient: Christopher [name]  Procedure(s) Performed: CYSTOSCOPY BASKET BLADDER STONE EXTRACTION (N/A ) REPLACEMENT OF SUPRAPUBIC CATHETER (N/A )     Patient location during evaluation: PACU Anesthesia Type: General Level of consciousness: awake and alert Pain management: pain level controlled Vital Signs Assessment: post-procedure vital signs reviewed and stable Respiratory status: spontaneous breathing, nonlabored ventilation, respiratory function stable and patient connected to nasal cannula oxygen Cardiovascular status: blood pressure returned to baseline and stable Postop Assessment: no apparent nausea or vomiting Anesthetic complications: no    Last Vitals:  Vitals:   05/06/19 1430 05/06/19 1452  BP: 138/87 (!) 141/99  Pulse: 86 80  Resp: 15 16  Temp:  36.7 C  SpO2: 95% 95%    Last Pain:  Vitals:   05/06/19 1452  TempSrc:   PainSc: 0-No pain                 Effie Berkshire

## 2019-05-08 ENCOUNTER — Telehealth: Payer: Self-pay | Admitting: *Deleted

## 2019-05-08 ENCOUNTER — Encounter (HOSPITAL_BASED_OUTPATIENT_CLINIC_OR_DEPARTMENT_OTHER): Payer: Self-pay | Admitting: Urology

## 2019-05-08 NOTE — Telephone Encounter (Signed)
[facility] called to extend PT orders on Christopher [name] 2wk5.  Approval given.

## 2019-05-12 ENCOUNTER — Other Ambulatory Visit: Payer: Self-pay | Admitting: Physical Medicine and Rehabilitation

## 2019-05-14 ENCOUNTER — Telehealth: Payer: Self-pay

## 2019-05-14 NOTE — Telephone Encounter (Signed)
Recieved phone message yesterday afternoon from patients fiance stating that they have tried multiple locations and no one has the medication dronabinol (marinol) in stock and is unable to obtain it.  They are asking if there is an alternate medication they can receive for the patient.

## 2019-05-14 NOTE — Telephone Encounter (Signed)
Have called wife and let her know there's no other options in terms of meds to replace Marinol, however suggest calling hospital outpt pharmacy- they are more likely to keep in stock.  Thank you

## 2019-05-15 ENCOUNTER — Telehealth: Payer: Self-pay | Admitting: Plastic Surgery

## 2019-05-15 MED ORDER — CEPHALEXIN 500 MG PO CAPS: 500.0000 mg | ORAL_CAPSULE | Freq: Four times a day (QID) | ORAL | 0 refills | Status: AC

## 2019-05-15 NOTE — Telephone Encounter (Signed)
Pt's wife called in to let us know that his left hip is red and swollen above the wound. Wound vac is not draining as much. He is complaining of pain today. She wanted to know what they should do.

## 2019-05-15 NOTE — Telephone Encounter (Signed)
Spoke with patient's wife, reports increased pain with dressing changes. No fever, chills. No increased drainage.  Patient stopped antibiotics in last few days, pain began to increase then.  Resent rx for keflex 500 QID for 5 days. Will see patient this week, Friday.

## 2019-05-16 ENCOUNTER — Ambulatory Visit: Payer: BC Managed Care – PPO | Admitting: Surgical

## 2019-05-17 ENCOUNTER — Encounter: Payer: Self-pay | Admitting: Plastic Surgery

## 2019-05-17 ENCOUNTER — Telehealth: Payer: Self-pay | Admitting: *Deleted

## 2019-05-17 ENCOUNTER — Other Ambulatory Visit: Payer: Self-pay

## 2019-05-17 ENCOUNTER — Ambulatory Visit (INDEPENDENT_AMBULATORY_CARE_PROVIDER_SITE_OTHER): Payer: BC Managed Care – PPO | Admitting: Plastic Surgery

## 2019-05-17 VITALS — BP 110/63 | HR 87 | Temp 98.0°F

## 2019-05-17 DIAGNOSIS — S31829D Unspecified open wound of left buttock, subsequent encounter: Secondary | ICD-10-CM

## 2019-05-17 DIAGNOSIS — T07XXXA Unspecified multiple injuries, initial encounter: Secondary | ICD-10-CM

## 2019-05-17 NOTE — Telephone Encounter (Signed)
Faxed order to [facility] for supplies for the patient.  Confirmation received.//AB/CMA 

## 2019-05-17 NOTE — Telephone Encounter (Signed)
Cindy NCM for [facility] called and reports that with his last d/c they ordered PT but not OT.  She is asking if Dr Dagoberto Ligas would please re-order PT/OT for Christopher [name].  They will need to be faxed to [phone].

## 2019-05-17 NOTE — Progress Notes (Signed)
   Subjective:     Patient ID: Christopher [name], male    DOB: December 24, 1965, 53 y.o.   MRN: [id number]  Chief Complaint  Patient presents with  . Follow-up    for swelling over (L) hip    HPI: The patient is a 53 y.o. male here for follow-up due to swelling, increased pain, and drainage from left hip wound.  He is accompanied by this wife.  They report area around left hip wound became swollen earlier this week causing pain and tenderness but no drainage. They called office on Wednesday and a RX for Keflex was given which he began taking. Yesterday wound began draining a significant amount of fluid similar in color (yellowish) to previous drainage. The swelling has improved since the wound began draining, however; the pain has not.    Denies fever, chills, nausea, vomiting, CP, SOB.   Patient has multiple wounds being treated. Left hip wound underwent excision and placement of Acell on 9/21. His PICC line was removed 04/14/2019.    He currently is taking Keflex (5 days) which was started Wednesday,  Review of Systems  Constitutional: Negative for chills and fever.  Respiratory: Negative for shortness of breath.   Cardiovascular: Negative for chest pain.  Gastrointestinal: Negative for nausea and vomiting.  Skin: Positive for wound (left hip wound: red, draining, painful).     Objective:   Vital Signs BP 110/63 (BP Location: Left Arm, Patient Position: Sitting, Cuff Size: Normal)   Pulse 87   Temp 98 F (36.7 C) (Temporal)   SpO2 95%  Vital Signs and Nursing Note Reviewed  Physical Exam  Constitutional: He is oriented to person, place, and time.  Thin white male.  HENT:  Head: Normocephalic and atraumatic.  Eyes: EOM are normal.  Pulmonary/Chest: Effort normal.  Neurological: He is alert and oriented to person, place, and time.  Skin:     Multiple other wounds present      Assessment/Plan:     ICD-10-CM   1. Wound of left buttock, subsequent encounter  S31.829D     Mr. Drees presented today due to pain, swelling, and drainage of wound on left posterior hip. Swelling has decreased and drainage has started since beginning Keflex on 05/15/2019. Pain has not resolved.   Continue taking Keflex until finished current Rx. Begin using Silver Alginate on posterior left hip wound.   Follow up appt scheduled for Thursday 12/10. Keep this appointment if no improvement. Next follow-up appointment scheduled for 12/22 with Dr. Marla Roe.  Pictures were obtained of the patient and placed in the chart with the patient's or guardian's permission.   Threasa Heads, PA-C 05/17/2019, 1:23 PM

## 2019-05-20 NOTE — Telephone Encounter (Signed)
Sent electronically 

## 2019-05-22 ENCOUNTER — Telehealth: Payer: Self-pay | Admitting: Plastic Surgery

## 2019-05-22 ENCOUNTER — Telehealth: Payer: Self-pay

## 2019-05-22 DIAGNOSIS — G8921 Chronic pain due to trauma: Secondary | ICD-10-CM

## 2019-05-22 NOTE — Telephone Encounter (Signed)

## 2019-05-23 ENCOUNTER — Other Ambulatory Visit: Payer: Self-pay

## 2019-05-23 ENCOUNTER — Encounter: Payer: Self-pay | Admitting: Plastic Surgery

## 2019-05-23 ENCOUNTER — Ambulatory Visit (INDEPENDENT_AMBULATORY_CARE_PROVIDER_SITE_OTHER): Payer: No Typology Code available for payment source | Admitting: Plastic Surgery

## 2019-05-23 VITALS — BP 117/75 | HR 68 | Temp 97.8°F

## 2019-05-23 DIAGNOSIS — S31829D Unspecified open wound of left buttock, subsequent encounter: Secondary | ICD-10-CM

## 2019-05-23 MED ORDER — FENTANYL 50 MCG/HR TD PT72: 1.0000 | MEDICATED_PATCH | TRANSDERMAL | 0 refills | Status: AC

## 2019-05-23 MED ORDER — DOXYCYCLINE HYCLATE 100 MG PO TABS: 100.0000 mg | ORAL_TABLET | Freq: Two times a day (BID) | ORAL | 0 refills | Status: DC

## 2019-05-23 MED ORDER — OXYCODONE-ACETAMINOPHEN 5-325 MG PO TABS: 1.0000 | ORAL_TABLET | ORAL | 0 refills | Status: DC | PRN

## 2019-05-23 NOTE — Telephone Encounter (Signed)
Have renewed Percocet 5/325 mg #150 and Duragesic 50 mcg every other day #15 for 1 month's supply.  Didn't refill Marinol since hadn't taken regularly since last seen since didn't have access through drugstore-wife couldn't find a place that stocked it- when they call and let me know which store has it, will refill it.

## 2019-05-23 NOTE — Progress Notes (Signed)
   Subjective:     Patient ID: Christopher [name], male    DOB: 26-Sep-1965, 53 y.o.   MRN: [id number]  Chief Complaint  Patient presents with  . Follow-up    3 weeks    HPI: The patient is a 53 y.o. male here for follow-up due to swelling, increased pain, and drainage from left hip wound.  He is accompanied by his wife and case Freight forwarder.  They report area around left hip wound became swollen early last week causing pain and tenderness but no drainage.  They called the office on Wednesday, December 2 and a Rx for Keflex (5 days) was given which he began taking.  On Thursday Dec 3 wound wound began draining a significant amount of fluid similar in color (yellowish) to previous drainage.  Swelling improved after it drained, but area was still painful.   Over last few days swelling has returned and pain has increased. Some drainage yellowish in color.  Patient is in tears due to pain in the office.   Denies fever, chills, nausea, vomiting, chest pain, shortness of breath.  Patient has multiple wounds being treated.  Left hip wound underwent excision and placement of ACell on 9/21.  Review of Systems  Constitutional: Negative for chills and fever.  Respiratory: Negative for shortness of breath.   Cardiovascular: Negative for chest pain.  Gastrointestinal: Negative for nausea and vomiting.  Skin: Positive for wound (left posterior hip: swelling above opening, yellowish drainage, very painful).     Objective:   Vital Signs BP 117/75 (BP Location: Left Arm, Patient Position: Sitting, Cuff Size: Normal)   Pulse 68   Temp 97.8 F (36.6 C) (Temporal)   SpO2 97%  Vital Signs and Nursing Note Reviewed  Physical Exam  Constitutional: He is oriented to person, place, and time.  Thin white male; Visibly tearful from pain; Easily fatigable with standing  HENT:  Head: Normocephalic and atraumatic.  Eyes: EOM are normal.  Pulmonary/Chest: Effort normal.  Neurological: He is alert and oriented  to person, place, and time.  Skin: Skin is warm. No rash noted. No pallor.     Psychiatric: Memory, affect and judgment normal.      Assessment/Plan:     ICD-10-CM   1. Wound of left buttock, subsequent encounter  S31.829D     Christopher [name] presented today due to increased pain, swelling, and drainage of wound on left posterior hip.  Swelling has increased and drainage has started again since we saw him last on Friday.  His pain has increased significantly to the point that he is tearful in the office.  He completed the 5-day regimen of Keflex.  They have not received the silver alginate yet.  Rx for 6 week course of doxycycline. Provided Rx for Silver alginate to case manager.    Current follow-up scheduled for 12/22 with Christopher [name]. Call office if condition worsens or they see no improvement within 1 week.   Pictures were obtained of the patient and placed in the chart with the patient's or guardian's permission.  Threasa Heads, PA-C 05/23/2019, 4:43 PM

## 2019-05-24 NOTE — Telephone Encounter (Signed)
Faxed prior auth request to [facility] @833 [phone].

## 2019-05-24 NOTE — Telephone Encounter (Signed)
Notified Mr Vanecek.

## 2019-05-29 ENCOUNTER — Telehealth: Payer: Self-pay | Admitting: *Deleted

## 2019-05-29 NOTE — Telephone Encounter (Signed)
Prior authorization submitted for fentanyl 50 mcg patches #15.   Approved through 11/27/2019

## 2019-05-30 ENCOUNTER — Observation Stay (HOSPITAL_COMMUNITY): Payer: BC Managed Care – PPO | Attending: Internal Medicine

## 2019-05-30 ENCOUNTER — Other Ambulatory Visit: Payer: Self-pay

## 2019-05-30 ENCOUNTER — Inpatient Hospital Stay (HOSPITAL_COMMUNITY)
Admission: EM | Admit: 2019-05-30 | Discharge: 2019-06-05 | DRG: 693 | Disposition: A | Discharge status: Home Health | Payer: No Typology Code available for payment source | Attending: Family Medicine | Admitting: Family Medicine

## 2019-05-30 ENCOUNTER — Emergency Department (HOSPITAL_COMMUNITY): Payer: BC Managed Care – PPO

## 2019-05-30 ENCOUNTER — Telehealth: Payer: Self-pay | Admitting: *Deleted

## 2019-05-30 ENCOUNTER — Telehealth: Payer: Self-pay

## 2019-05-30 DIAGNOSIS — Z933 Colostomy status: Secondary | ICD-10-CM

## 2019-05-30 DIAGNOSIS — F339 Major depressive disorder, recurrent, unspecified: Secondary | ICD-10-CM | POA: Diagnosis present

## 2019-05-30 DIAGNOSIS — S3730XS Unspecified injury of urethra, sequela: Secondary | ICD-10-CM

## 2019-05-30 DIAGNOSIS — L089 Local infection of the skin and subcutaneous tissue, unspecified: Secondary | ICD-10-CM | POA: Diagnosis present

## 2019-05-30 DIAGNOSIS — F329 Major depressive disorder, single episode, unspecified: Secondary | ICD-10-CM | POA: Diagnosis present

## 2019-05-30 DIAGNOSIS — N390 Urinary tract infection, site not specified: Secondary | ICD-10-CM | POA: Diagnosis present

## 2019-05-30 DIAGNOSIS — W230XXS Caught, crushed, jammed, or pinched between moving objects, sequela: Secondary | ICD-10-CM | POA: Diagnosis present

## 2019-05-30 DIAGNOSIS — S3282XS Multiple fractures of pelvis without disruption of pelvic ring, sequela: Secondary | ICD-10-CM

## 2019-05-30 DIAGNOSIS — Z803 Family history of malignant neoplasm of breast: Secondary | ICD-10-CM

## 2019-05-30 DIAGNOSIS — F419 Anxiety disorder, unspecified: Secondary | ICD-10-CM | POA: Diagnosis present

## 2019-05-30 DIAGNOSIS — N132 Hydronephrosis with renal and ureteral calculous obstruction: Secondary | ICD-10-CM | POA: Diagnosis not present

## 2019-05-30 DIAGNOSIS — Z681 Body mass index (BMI) 19 or less, adult: Secondary | ICD-10-CM

## 2019-05-30 DIAGNOSIS — F32A Depression, unspecified: Secondary | ICD-10-CM | POA: Diagnosis present

## 2019-05-30 DIAGNOSIS — N138 Other obstructive and reflux uropathy: Secondary | ICD-10-CM

## 2019-05-30 DIAGNOSIS — G894 Chronic pain syndrome: Secondary | ICD-10-CM | POA: Diagnosis present

## 2019-05-30 DIAGNOSIS — Z9359 Other cystostomy status: Secondary | ICD-10-CM

## 2019-05-30 DIAGNOSIS — M4628 Osteomyelitis of vertebra, sacral and sacrococcygeal region: Secondary | ICD-10-CM | POA: Diagnosis present

## 2019-05-30 DIAGNOSIS — B3749 Other urogenital candidiasis: Secondary | ICD-10-CM | POA: Diagnosis present

## 2019-05-30 DIAGNOSIS — Z87891 Personal history of nicotine dependence: Secondary | ICD-10-CM

## 2019-05-30 DIAGNOSIS — T07XXXA Unspecified multiple injuries, initial encounter: Secondary | ICD-10-CM | POA: Diagnosis present

## 2019-05-30 DIAGNOSIS — N200 Calculus of kidney: Secondary | ICD-10-CM | POA: Diagnosis not present

## 2019-05-30 DIAGNOSIS — M86652 Other chronic osteomyelitis, left thigh: Secondary | ICD-10-CM | POA: Diagnosis present

## 2019-05-30 DIAGNOSIS — R05 Cough: Secondary | ICD-10-CM

## 2019-05-30 DIAGNOSIS — S329XXK Fracture of unspecified parts of lumbosacral spine and pelvis, subsequent encounter for fracture with nonunion: Secondary | ICD-10-CM

## 2019-05-30 DIAGNOSIS — R059 Cough, unspecified: Secondary | ICD-10-CM

## 2019-05-30 DIAGNOSIS — L89159 Pressure ulcer of sacral region, unspecified stage: Secondary | ICD-10-CM | POA: Diagnosis present

## 2019-05-30 DIAGNOSIS — Z20828 Contact with and (suspected) exposure to other viral communicable diseases: Secondary | ICD-10-CM | POA: Diagnosis present

## 2019-05-30 DIAGNOSIS — M8668 Other chronic osteomyelitis, other site: Secondary | ICD-10-CM | POA: Diagnosis present

## 2019-05-30 DIAGNOSIS — G4701 Insomnia due to medical condition: Secondary | ICD-10-CM

## 2019-05-30 DIAGNOSIS — W230XXD Caught, crushed, jammed, or pinched between moving objects, subsequent encounter: Secondary | ICD-10-CM | POA: Diagnosis present

## 2019-05-30 DIAGNOSIS — E43 Unspecified severe protein-calorie malnutrition: Secondary | ICD-10-CM | POA: Diagnosis present

## 2019-05-30 LAB — COMPREHENSIVE METABOLIC PANEL
ALT: 39 U/L (ref 0–44)
AST: 28 U/L (ref 15–41)
Albumin: 3.1 g/dL — ABNORMAL LOW (ref 3.5–5.0)
Alkaline Phosphatase: 187 U/L — ABNORMAL HIGH (ref 38–126)
Anion gap: 10 (ref 5–15)
BUN: 17 mg/dL (ref 6–20)
CO2: 24 mmol/L (ref 22–32)
Calcium: 10.2 mg/dL (ref 8.9–10.3)
Chloride: 106 mmol/L (ref 98–111)
Creatinine, Ser: 0.62 mg/dL (ref 0.61–1.24)
GFR calc Af Amer: >60 mL/min (ref >60)
GFR calc non Af Amer: >60 mL/min (ref >60)
Glucose, Bld: 110 mg/dL — ABNORMAL HIGH (ref 70–99)
Potassium: 3.5 mmol/L (ref 3.5–5.1)
Sodium: 140 mmol/L (ref 135–145)
Total Bilirubin: 0.4 mg/dL (ref 0.3–1.2)
Total Protein: 7.7 g/dL (ref 6.5–8.1)

## 2019-05-30 LAB — CBC WITH DIFFERENTIAL/PLATELET
Abs Immature Granulocytes: 0.04 10*3/uL (ref 0.00–0.07)
Basophils Absolute: 0 10*3/uL (ref 0.0–0.1)
Basophils Relative: 0 %
Eosinophils Absolute: 0.2 10*3/uL (ref 0.0–0.5)
Eosinophils Relative: 2 %
HCT: 44.6 % (ref 39.0–52.0)
Hemoglobin: 14.4 g/dL (ref 13.0–17.0)
Immature Granulocytes: 0 %
Lymphocytes Relative: 9 %
Lymphs Abs: 0.9 10*3/uL (ref 0.7–4.0)
MCH: 32.2 pg (ref 26.0–34.0)
MCHC: 32.3 g/dL (ref 30.0–36.0)
MCV: 99.8 fL (ref 80.0–100.0)
Monocytes Absolute: 0.6 10*3/uL (ref 0.1–1.0)
Monocytes Relative: 6 %
Neutro Abs: 8.9 10*3/uL — ABNORMAL HIGH (ref 1.7–7.7)
Neutrophils Relative %: 83 %
Platelets: 327 10*3/uL (ref 150–400)
RBC: 4.47 MIL/uL (ref 4.22–5.81)
RDW: 12.4 % (ref 11.5–15.5)
WBC: 10.6 10*3/uL — ABNORMAL HIGH (ref 4.0–10.5)
nRBC: 0 % (ref 0.0–0.2)

## 2019-05-30 LAB — RESPIRATORY PANEL BY RT PCR (FLU A&B, COVID)
Influenza A by PCR: NEGATIVE
Influenza B by PCR: NEGATIVE
SARS Coronavirus 2 by RT PCR: NEGATIVE

## 2019-05-30 LAB — URINALYSIS, ROUTINE W REFLEX MICROSCOPIC
Bilirubin Urine: NEGATIVE
Glucose, UA: NEGATIVE mg/dL
Ketones, ur: NEGATIVE mg/dL
Nitrite: NEGATIVE
Protein, ur: 100 mg/dL — AB
Specific Gravity, Urine: >1.03 — ABNORMAL HIGH (ref 1.005–1.030)
pH: 5.5 (ref 5.0–8.0)

## 2019-05-30 LAB — LIPASE, BLOOD: Lipase: 18 U/L (ref 11–51)

## 2019-05-30 LAB — LACTIC ACID, PLASMA
Lactic Acid, Venous: 1.5 mmol/L (ref 0.5–1.9)
Lactic Acid, Venous: 2.1 mmol/L (ref 0.5–1.9)

## 2019-05-30 LAB — URINALYSIS, MICROSCOPIC (REFLEX)
RBC / HPF: >50 RBC/hpf (ref 0–5)
Squamous Epithelial / HPF: NONE SEEN (ref 0–5)
WBC, UA: >50 WBC/hpf (ref 0–5)

## 2019-05-30 MED ORDER — ONDANSETRON HCL 4 MG/2ML IJ SOLN
2.0000 mg | Freq: Once | INTRAMUSCULAR | Status: AC
  Administered 2019-05-30: 2 mg via INTRAVENOUS
  Filled 2019-05-30: qty 2

## 2019-05-30 MED ORDER — ACETAMINOPHEN 325 MG PO TABS
650.0000 mg | ORAL_TABLET | Freq: Four times a day (QID) | ORAL | Status: DC | PRN
  Administered 2019-05-31 – 2019-06-05 (×4): 650 mg via ORAL
  Filled 2019-05-30 (×4): qty 2

## 2019-05-30 MED ORDER — ADULT MULTIVITAMIN W/MINERALS CH
1.0000 | ORAL_TABLET | Freq: Every day | ORAL | Status: DC
  Administered 2019-05-31 – 2019-06-05 (×6): 1 via ORAL
  Filled 2019-05-30 (×6): qty 1

## 2019-05-30 MED ORDER — METHOCARBAMOL 500 MG PO TABS
500.0000 mg | ORAL_TABLET | Freq: Three times a day (TID) | ORAL | Status: DC | PRN
  Administered 2019-05-30 – 2019-06-05 (×6): 500 mg via ORAL
  Filled 2019-05-30 (×6): qty 1

## 2019-05-30 MED ORDER — HYDROMORPHONE HCL 1 MG/ML IJ SOLN
1.0000 mg | Freq: Once | INTRAMUSCULAR | Status: AC
  Administered 2019-05-30: 1 mg via INTRAVENOUS
  Filled 2019-05-30: qty 1

## 2019-05-30 MED ORDER — ONDANSETRON HCL 4 MG/2ML IJ SOLN
4.0000 mg | Freq: Once | INTRAMUSCULAR | Status: AC
  Administered 2019-05-30: 4 mg via INTRAVENOUS
  Filled 2019-05-30: qty 2

## 2019-05-30 MED ORDER — SODIUM CHLORIDE 0.9 % IV SOLN
1.0000 g | INTRAVENOUS | Status: DC
  Administered 2019-05-31: 1 g via INTRAVENOUS
  Filled 2019-05-30: qty 10
  Filled 2019-05-30: qty 1

## 2019-05-30 MED ORDER — OXYCODONE-ACETAMINOPHEN 5-325 MG PO TABS
1.0000 | ORAL_TABLET | ORAL | Status: DC | PRN
  Administered 2019-05-30 – 2019-06-01 (×5): 1 via ORAL
  Filled 2019-05-30 (×5): qty 1

## 2019-05-30 MED ORDER — FENTANYL 25 MCG/HR TD PT72
1.0000 | MEDICATED_PATCH | TRANSDERMAL | Status: DC
  Administered 2019-05-31 – 2019-06-04 (×3): 1 via TRANSDERMAL
  Filled 2019-05-30 (×5): qty 1

## 2019-05-30 MED ORDER — SENNOSIDES-DOCUSATE SODIUM 8.6-50 MG PO TABS
2.0000 | ORAL_TABLET | Freq: Every day | ORAL | Status: DC
  Administered 2019-05-30 – 2019-06-04 (×5): 2 via ORAL
  Filled 2019-05-30 (×6): qty 2

## 2019-05-30 MED ORDER — DOXYCYCLINE HYCLATE 100 MG PO TABS
100.0000 mg | ORAL_TABLET | Freq: Two times a day (BID) | ORAL | Status: DC
  Administered 2019-05-30 – 2019-06-03 (×9): 100 mg via ORAL
  Filled 2019-05-30 (×10): qty 1

## 2019-05-30 MED ORDER — CLONAZEPAM 0.125 MG PO TBDP
0.2500 mg | ORAL_TABLET | Freq: Three times a day (TID) | ORAL | Status: DC | PRN
  Administered 2019-06-01 – 2019-06-05 (×2): 0.25 mg via ORAL
  Filled 2019-05-30 (×2): qty 2

## 2019-05-30 MED ORDER — GABAPENTIN 300 MG PO CAPS
300.0000 mg | ORAL_CAPSULE | Freq: Three times a day (TID) | ORAL | Status: DC
  Administered 2019-05-30 – 2019-06-05 (×17): 300 mg via ORAL
  Filled 2019-05-30 (×17): qty 1

## 2019-05-30 MED ORDER — MAGNESIUM HYDROXIDE 400 MG/5ML PO SUSP: 30.0000 mL | Freq: Every day | ORAL | Status: DC | PRN

## 2019-05-30 MED ORDER — LORATADINE 10 MG PO TABS
10.0000 mg | ORAL_TABLET | Freq: Every day | ORAL | Status: DC
  Administered 2019-05-31 – 2019-06-05 (×6): 10 mg via ORAL
  Filled 2019-05-30 (×6): qty 1

## 2019-05-30 MED ORDER — QUETIAPINE FUMARATE 50 MG PO TABS
50.0000 mg | ORAL_TABLET | Freq: Every day | ORAL | Status: DC
  Administered 2019-05-30 – 2019-05-31 (×2): 50 mg via ORAL
  Filled 2019-05-30 (×2): qty 1

## 2019-05-30 MED ORDER — MORPHINE SULFATE (PF) 4 MG/ML IV SOLN
4.0000 mg | Freq: Once | INTRAVENOUS | Status: AC
  Administered 2019-05-30: 4 mg via INTRAVENOUS
  Filled 2019-05-30: qty 1

## 2019-05-30 MED ORDER — PROMETHAZINE HCL 25 MG/ML IJ SOLN
6.2500 mg | Freq: Four times a day (QID) | INTRAMUSCULAR | Status: DC | PRN
  Administered 2019-05-30 – 2019-06-05 (×2): 6.25 mg via INTRAVENOUS
  Filled 2019-05-30 (×2): qty 1

## 2019-05-30 MED ORDER — SODIUM CHLORIDE 0.9 % IV SOLN
1.0000 g | Freq: Once | INTRAVENOUS | Status: AC
  Administered 2019-05-30: 1 g via INTRAVENOUS
  Filled 2019-05-30: qty 10

## 2019-05-30 MED ORDER — FENTANYL CITRATE (PF) 100 MCG/2ML IJ SOLN
25.0000 ug | INTRAMUSCULAR | Status: DC | PRN
  Administered 2019-05-30 – 2019-05-31 (×5): 25 ug via INTRAVENOUS
  Filled 2019-05-30 (×5): qty 2

## 2019-05-30 MED ORDER — GUAIFENESIN ER 600 MG PO TB12
1200.0000 mg | ORAL_TABLET | Freq: Two times a day (BID) | ORAL | Status: DC
  Administered 2019-05-30 – 2019-06-05 (×12): 1200 mg via ORAL
  Filled 2019-05-30 (×12): qty 2

## 2019-05-30 MED ORDER — FENTANYL CITRATE (PF) 100 MCG/2ML IJ SOLN
50.0000 ug | Freq: Once | INTRAMUSCULAR | Status: AC
  Administered 2019-05-30: 50 ug via INTRAVENOUS
  Filled 2019-05-30: qty 2

## 2019-05-30 MED ORDER — SODIUM CHLORIDE 0.9% FLUSH
5.0000 mL | Freq: Three times a day (TID) | INTRAVENOUS | Status: DC
  Administered 2019-05-31 – 2019-06-03 (×9): 5 mL

## 2019-05-30 MED ORDER — PRO-STAT SUGAR FREE PO LIQD
30.0000 mL | Freq: Three times a day (TID) | ORAL | Status: DC
  Administered 2019-05-31 – 2019-06-05 (×14): 30 mL via ORAL
  Filled 2019-05-30 (×14): qty 30

## 2019-05-30 MED ORDER — MELATONIN 3 MG PO TABS
3.0000 mg | ORAL_TABLET | Freq: Every day | ORAL | Status: DC
  Administered 2019-05-30 – 2019-06-04 (×6): 3 mg via ORAL
  Filled 2019-05-30 (×8): qty 1

## 2019-05-30 MED ORDER — SODIUM CHLORIDE 0.9 % IV BOLUS
1000.0000 mL | Freq: Once | INTRAVENOUS | Status: AC
  Administered 2019-05-30: 1000 mL via INTRAVENOUS

## 2019-05-30 MED ORDER — POLYETHYLENE GLYCOL 3350 17 G PO PACK
17.0000 g | PACK | Freq: Every day | ORAL | Status: DC
  Filled 2019-05-30 (×2): qty 1

## 2019-05-30 MED ORDER — ALBUTEROL SULFATE (2.5 MG/3ML) 0.083% IN NEBU: 3.0000 mL | INHALATION_SOLUTION | Freq: Four times a day (QID) | RESPIRATORY_TRACT | Status: DC | PRN

## 2019-05-30 MED ORDER — TAMSULOSIN HCL 0.4 MG PO CAPS
0.4000 mg | ORAL_CAPSULE | Freq: Every day | ORAL | Status: DC
  Administered 2019-05-30 – 2019-06-04 (×6): 0.4 mg via ORAL
  Filled 2019-05-30 (×6): qty 1

## 2019-05-30 MED ORDER — DRONABINOL 2.5 MG PO CAPS
2.5000 mg | ORAL_CAPSULE | Freq: Two times a day (BID) | ORAL | Status: DC
  Administered 2019-05-31 – 2019-06-05 (×10): 2.5 mg via ORAL
  Filled 2019-05-30 (×10): qty 1

## 2019-05-30 MED ORDER — CITALOPRAM HYDROBROMIDE 20 MG PO TABS
20.0000 mg | ORAL_TABLET | Freq: Every day | ORAL | Status: DC
  Administered 2019-05-31 – 2019-06-05 (×6): 20 mg via ORAL
  Filled 2019-05-30 (×6): qty 1

## 2019-05-30 MED ORDER — PANTOPRAZOLE SODIUM 40 MG IV SOLR
40.0000 mg | Freq: Two times a day (BID) | INTRAVENOUS | Status: DC
  Administered 2019-05-30 – 2019-06-01 (×4): 40 mg via INTRAVENOUS
  Filled 2019-05-30 (×4): qty 40

## 2019-05-30 MED ORDER — LIDOCAINE 5 % EX PTCH
3.0000 | MEDICATED_PATCH | CUTANEOUS | Status: DC
  Filled 2019-05-30 (×2): qty 3

## 2019-05-30 MED ORDER — SODIUM CHLORIDE 0.9 % IV SOLN: INTRAVENOUS | Status: DC
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
## 2019-05-30 NOTE — Telephone Encounter (Signed)
Wife [name] called to alert Dr Dagoberto Ligas that Hommer is being transported to [facility] ED due to pain.
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
## 2019-05-30 NOTE — Telephone Encounter (Signed)
Received call from patient's wife to inform us that the patient is going to the ED due to pain from possible hip infection.

## 2019-05-30 NOTE — Consult Note (Signed)
Urology Consult  Referring physician: Dr. 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 The pain is sharp, constant, severe and nonraditing. His pain could not be controlled int he ER with narcotic pain mediation. He underwnet CT stone study which shows a new 40mm right mid ureteral calculus with moderate hydronephrosis. No fevers/chills/sweats. He does have nausea but no vomiting. No other associated symptoms. No other exacerbating/alleviating events.  Past Medical History:  Diagnosis Date  . Acute on chronic respiratory failure with hypoxia ([place]) 06/2018   trach removed 11-16-2018, on vent from jan until may 2020  . Bacteremia due to Pseudomonas 06/2018  . Chronic pain syndrome   . History of kidney stones   . Multiple traumatic injuries   . Wound discharge    left hip wound with bloody/clear drainage change dressing q day surgilube with gauze, between legs wound using calcium algenate pad bid   Past Surgical History:  Procedure Laterality Date  . APPLICATION OF A-CELL OF BACK N/A 08/06/2018   Procedure: Application Of A-Cell Of Back;  Surgeon: [name], DO;  Location: [place];  Service: Plastics;  Laterality: N/A;  . APPLICATION OF A-CELL OF EXTREMITY Left 08/06/2018   Procedure: Application Of A-Cell Of Extremity;  Surgeon: [name], DO;  Location: [place];  Service: Plastics;  Laterality: Left;  . APPLICATION OF WOUND VAC  07/12/2018   Procedure: Application Of Wound Vac to the Left Thigh and Scrotum.;  Surgeon: Shona Needles, MD;  Location: [place];  Service: Orthopedics;;  . APPLICATION OF WOUND VAC  07/10/2018   Procedure: Application Of Wound Vac;  Surgeon: Clovis Riley, MD;   Location: [place];  Service: General;;  . COLOSTOMY N/A 07/23/2018   Procedure: COLOSTOMY;  Surgeon: Georganna Skeans, MD;  Location: [place];  Service: General;  Laterality: N/A;  . CYSTOSCOPY W/ URETERAL STENT PLACEMENT N/A 07/15/2018   Procedure: RETROGRADE URETHROGRAM;  Surgeon: Franchot Gallo, MD;  Location: [place];  Service: Urology;  Laterality: N/A;  . CYSTOSCOPY WITH LITHOLAPAXY N/A 05/06/2019   Procedure: CYSTOSCOPY BASKET BLADDER STONE EXTRACTION;  Surgeon: Cleon Gustin, MD;  Location: [facility];  Service: Urology;  Laterality: N/A;  30 MINS  . CYSTOSTOMY N/A 05/06/2019   Procedure: REPLACEMENT OF SUPRAPUBIC CATHETER;  Surgeon: Cleon Gustin, MD;  Location: [facility];  Service: Urology;  Laterality: N/A;  . DEBRIDEMENT AND CLOSURE WOUND Left 03/04/2019   Procedure: Excision of hip wound with placement of Acell;  Surgeon: [name], DO;  Location: [place];  Service: Plastics;  Laterality: Left;  . ESOPHAGOGASTRODUODENOSCOPY N/A 08/14/2018   Procedure: ESOPHAGOGASTRODUODENOSCOPY (EGD);  Surgeon: Georganna Skeans, MD;  Location: [place];  Service: General;  Laterality: N/A;  bedside  . FACIAL RECONSTRUCTION SURGERY     X 2--once as a teenager and second time in his 77's  . HARDWARE REMOVAL Left 03/04/2019   Procedure: Left Hip Hardware Removal;  Surgeon: Shona Needles, MD;  Location: [place];  Service: Orthopedics;  Laterality: Left;  . HIP PINNING,CANNULATED Left 07/12/2018   Procedure: CANNULATED HIP PINNING;  Surgeon: Shona Needles, MD;  Location: [place];  Service: Orthopedics;  Laterality: Left;  . HIP  SURGERY    . I & D EXTREMITY Left 07/25/2018   Procedure: Debridement of buttock, scrotum and left leg, placement of acell and vac;  Surgeon: Peggye Form, DO;  [facility];  Service: Plastics;  Laterality: Left;  . I & D EXTREMITY N/A 08/06/2018   Procedure: Debridement of buttock, scrotum and left leg;  Surgeon:  Peggye Form, DO;  [facility];  Service: Plastics;  Laterality: N/A;  . I & D EXTREMITY N/A 08/13/2018   Procedure: Debridement of buttock, scrotum and left leg, placement of acell and vac;  Surgeon: Peggye Form, DO;  [facility];  Service: Plastics;  Laterality: N/A;  90 min, please  . INCISION AND DRAINAGE OF WOUND N/A 07/18/2018   Procedure: Debridement of left leg, buttocks and scrotal wound with placement of acell and Flexiseal;  Surgeon: Peggye Form, DO;  [facility];  Service: Plastics;  Laterality: N/A;  . INCISION AND DRAINAGE OF WOUND Left 08/29/2018   Procedure: Debridement of buttock, scrotum and left leg, placement of acell and vac;  Surgeon: Peggye Form, DO;  [facility];  Service: Plastics;  Laterality: Left;  75 min, please  . INCISION AND DRAINAGE OF WOUND Bilateral 10/23/2018   Procedure: DEBRIDEMENT OF BUTTOCK,SCROTUM, AND LEG WOUNDS WITH PLACEMENT OF ACELL- BILATERAL 90 MIN;  Surgeon: Peggye Form, DO;  [facility];  Service: Plastics;  Laterality: Bilateral;  . IR ANGIOGRAM PELVIS SELECTIVE OR SUPRASELECTIVE  07/10/2018  . IR ANGIOGRAM PELVIS SELECTIVE OR SUPRASELECTIVE  07/10/2018  . IR ANGIOGRAM SELECTIVE EACH ADDITIONAL VESSEL  07/10/2018  . IR EMBO ART  VEN HEMORR LYMPH EXTRAV  INC GUIDE ROADMAPPING  07/10/2018  . IR NEPHROSTOMY PLACEMENT LEFT  04/05/2019  . IR US GUIDE BX ASP/DRAIN  07/10/2018  . IR US GUIDE VASC ACCESS RIGHT  07/10/2018  . IR VENO/EXT/UNI LEFT  07/10/2018  . IRRIGATION AND DEBRIDEMENT OF WOUND WITH SPLIT THICKNESS SKIN GRAFT Left 09/19/2018   Procedure: Debridement of gluteal wound with placement of acell to left leg.;  Surgeon: Peggye Form, DO;  [facility];  Service: Plastics;  Laterality: Left;  2.5 hours, please  . LAPAROTOMY N/A 07/12/2018   Procedure: EXPLORATORY LAPAROTOMY;  Surgeon: Violeta Gelinas, MD;  Location: [facility] OR;  Service: General;  Laterality: N/A;  . LAPAROTOMY N/A 07/15/2018    Procedure: WOUND EXPLORATION; CLOSURE OF ABDOMEN;  Surgeon: Violeta Gelinas, MD;  Location: [facility] OR;  Service: General;  Laterality: N/A;  . LAPAROTOMY  07/10/2018   Procedure: Exploratory Laparotomy;  Surgeon: Berna Bue, MD;  Location[facility] OR;  Service: General;;  . PEG PLACEMENT N/A 08/14/2018   Procedure: PERCUTANEOUS ENDOSCOPIC GASTROSTOMY (PEG) PLACEMENT;  Surgeon: Violeta Gelinas, MD;  Location: [facility] ENDOSCOPY;  Service: General;  Laterality: N/A;  . PERCUTANEOUS TRACHEOSTOMY N/A 08/02/2018   Procedure: PERCUTANEOUS TRACHEOSTOMY;  Surgeon: Violeta Gelinas, MD;  Location: [facility] OR;  Service: General;  Laterality: N/A;  . RADIOLOGY WITH ANESTHESIA N/A 07/10/2018   Procedure: IR WITH ANESTHESIA;  Surgeon: Simonne Come, MD;  Location: [facility] OR;  Service: Radiology;  Laterality: N/A;  . RADIOLOGY WITH ANESTHESIA Right 07/10/2018   Procedure: Ir With Anesthesia;  Surgeon: Simonne Come, MD;  Location: [name] OR;  Service: Radiology;  Laterality: Right;  . SCROTAL EXPLORATION N/A 07/15/2018   Procedure: SCROTUM DEBRIDEMENT;  Surgeon: Marcine Matar, MD;  Location: [facility] OR;  Service: Urology;  Laterality: N/A;  . SHOULDER SURGERY    . SKIN SPLIT GRAFT  Right 09/19/2018   Procedure: Skin Graft Split Thickness;  Surgeon: Peggye Form, DO;  [facility];  Service: Plastics;  Laterality: Right;  . SKIN SPLIT GRAFT N/A 10/03/2018   Procedure: Split thickness skin graft to gluteal area with acell placement;  Surgeon: Peggye Form, DO;  [facility];  Service: Plastics;  Laterality: N/A;  3 hours, please  . VACUUM ASSISTED CLOSURE CHANGE N/A 07/12/2018   Procedure: ABDOMINAL VACUUM ASSISTED CLOSURE CHANGE and abdominal washout;  Surgeon: Violeta Gelinas, MD;  Location: [facility] OR;  Service: General;  Laterality: N/A;  . WOUND DEBRIDEMENT Left 07/23/2018   Procedure: DEBRIDEMENT LEFT BUTTOCK  WOUND;  Surgeon: Violeta Gelinas, MD;  Location: [facility] OR;  Service: General;  Laterality: Left;  . WOUND EXPLORATION  Left 07/10/2018   Procedure: WOUND EXPLORATION LEFT GROIN;  Surgeon: Berna Bue, MD;  Location: [facility] OR;  Service: General;  Laterality: Left;    Medications: I have reviewed the patient's current medications. Allergies: No Known Allergies  Family History  Problem Relation Age of Onset  . Breast cancer Mother        with mets to the bones   Social History:  reports that he quit smoking about 10 months ago. His smoking use included cigarettes. He has a 20.00 pack-year smoking history. He has never used smokeless tobacco. He reports that he does not drink alcohol or use drugs.  Review of Systems  Gastrointestinal: Positive for nausea.  Genitourinary: Positive for flank pain.    Physical Exam:  Vital signs in last 24 hours: Temp:  [97.2 F (36.2 C)-98.2 F (36.8 C)] 98.2 F (36.8 C) (12/17 2301) Pulse Rate:  [73-111] 85 (12/17 2301) Resp:  [14-36] 19 (12/17 2301) BP: (137-161)/(74-115) 139/74 (12/17 2301) SpO2:  [88 %-100 %] 100 % (12/17 2301) Weight:  [59 kg] 59 kg (12/17 1218) Physical Exam  Vitals reviewed. Constitutional: He is oriented to person, place, and time. He appears well-developed and well-nourished.  HENT:  Head: Normocephalic and atraumatic.  Eyes: Pupils are equal, round, and reactive to light. EOM are normal.  Neck: No thyromegaly present.  Cardiovascular: Normal rate and regular rhythm.  Respiratory: Effort normal. No respiratory distress.  GI: Soft. He exhibits no distension. There is no abdominal tenderness.  Musculoskeletal:        General: Deformity present. No edema.     Cervical back: Normal range of motion.  Neurological: He is alert and oriented to person, place, and time.  Skin: Skin is warm and dry.  Psychiatric: He has a normal mood and affect. His behavior is normal. Judgment and thought content normal.    Laboratory Data:  Results for orders placed or performed during the hospital encounter of 05/30/19 (from the past 72 hour(s))  CBC  with Differential     Status: Abnormal   Collection Time: 05/30/19 12:30 PM  Result Value Ref Range   WBC 10.6 (H) 4.0 - 10.5 K/uL   RBC 4.47 4.22 - 5.81 MIL/uL   Hemoglobin 14.4 13.0 - 17.0 g/dL   HCT 40.9 81.1 - 91.4 %   MCV 99.8 80.0 - 100.0 fL   MCH 32.2 26.0 - 34.0 pg   MCHC 32.3 30.0 - 36.0 g/dL   RDW 78.2 95.6 - 21.3 %   Platelets 327 150 - 400 K/uL   nRBC 0.0 0.0 - 0.2 %   Neutrophils Relative % 83 %   Neutro Abs 8.9 (H) 1.7 - 7.7 K/uL   Lymphocytes Relative 9 %  Lymphs Abs 0.9 0.7 - 4.0 K/uL   Monocytes Relative 6 %   Monocytes Absolute 0.6 0.1 - 1.0 K/uL   Eosinophils Relative 2 %   Eosinophils Absolute 0.2 0.0 - 0.5 K/uL   Basophils Relative 0 %   Basophils Absolute 0.0 0.0 - 0.1 K/uL   Immature Granulocytes 0 %   Abs Immature Granulocytes 0.04 0.00 - 0.07 K/uL    Comment: Performed at [facility], [address] [place], [place] [address]  Comprehensive metabolic panel     Status: Abnormal   Collection Time: 05/30/19 12:30 PM  Result Value Ref Range   Sodium 140 135 - 145 mmol/L   Potassium 3.5 3.5 - 5.1 mmol/L   Chloride 106 98 - 111 mmol/L   CO2 24 22 - 32 mmol/L   Glucose, Bld 110 (H) 70 - 99 mg/dL   BUN 17 6 - 20 mg/dL   Creatinine, Ser 6.040.62 0.61 - 1.24 mg/dL   Calcium 54.010.2 8.9 - 98.110.3 mg/dL   Total Protein 7.7 6.5 - 8.1 g/dL   Albumin 3.1 (L) 3.5 - 5.0 g/dL   AST 28 15 - 41 U/L   ALT 39 0 - 44 U/L   Alkaline Phosphatase 187 (H) 38 - 126 U/L   Total Bilirubin 0.4 0.3 - 1.2 mg/dL   GFR calc non Af Amer >60 >60 mL/min   GFR calc Af Amer >60 >60 mL/min   Anion gap 10 5 - 15    Comment: Performed at [facility], [address] [place], [place] [address]  Lipase, blood     Status: None   Collection Time: 05/30/19 12:30 PM  Result Value Ref Range   Lipase 18 11 - 51 U/L    Comment: Performed at [facility], [address] [place], [place] [address]  Urinalysis, Routine w reflex microscopic     Status: Abnormal   Collection Time:  05/30/19 12:30 PM  Result Value Ref Range   Color, Urine AMBER (A) YELLOW    Comment: BIOCHEMICALS MAY BE AFFECTED BY COLOR   APPearance TURBID (A) CLEAR   Specific Gravity, Urine >1.030 (H) 1.005 - 1.030   pH 5.5 5.0 - 8.0   Glucose, UA NEGATIVE NEGATIVE mg/dL   Hgb urine dipstick LARGE (A) NEGATIVE   Bilirubin Urine NEGATIVE NEGATIVE   Ketones, ur NEGATIVE NEGATIVE mg/dL   Protein, ur 213100 (A) NEGATIVE mg/dL   Nitrite NEGATIVE NEGATIVE   Leukocytes,Ua MODERATE (A) NEGATIVE    Comment: Performed at [facility], [address] [place], [place] [address]  Urinalysis, Microscopic (reflex)     Status: Abnormal   Collection Time: 05/30/19 12:30 PM  Result Value Ref Range   RBC / HPF >50 0 - 5 RBC/hpf   WBC, UA >50 0 - 5 WBC/hpf   Bacteria, UA MANY (A) NONE SEEN   Squamous Epithelial / LPF NONE SEEN 0 - 5   Mucus PRESENT    Budding Yeast PRESENT    Hyphae Yeast PRESENT     Comment: Performed at [facility], [address] [place], [place] [address]  Lactic acid, plasma     Status: None   Collection Time: 05/30/19  2:44 PM  Result Value Ref Range   Lactic Acid, Venous 1.5 0.5 - 1.9 mmol/L    Comment: Performed at [facility], [address] [place], [place] [address]  Respiratory Panel by RT PCR (Flu A&B, Covid) -  Nasopharyngeal Swab     Status: None   Collection Time: 05/30/19  6:07 PM   Specimen: Nasopharyngeal Swab  Result Value Ref Range   SARS Coronavirus 2 by RT PCR NEGATIVE NEGATIVE    Comment: (NOTE) SARS-CoV-2 target nucleic acids are NOT DETECTED. The SARS-CoV-2 RNA is generally detectable in upper respiratoy specimens during the acute phase of infection. The lowest concentration of SARS-CoV-2 viral copies this assay can detect is 131 copies/mL. A negative result does not preclude SARS-Cov-2 infection and should not be used as the sole basis for treatment or other patient management decisions. A negative result may occur with  improper specimen  collection/handling, submission of specimen other than nasopharyngeal swab, presence of viral mutation(s) within the areas targeted by this assay, and inadequate number of viral copies (<131 copies/mL). A negative result must be combined with clinical observations, patient history, and epidemiological information. The expected result is Negative. Fact Sheet for Patients:  https://www.moore.com/ Fact Sheet for Healthcare Providers:  https://www.young.biz/ This test is not yet ap proved or cleared by the [place] FDA and  has been authorized for detection and/or diagnosis of SARS-CoV-2 by FDA under an Emergency Use Authorization (EUA). This EUA will remain  in effect (meaning this test can be used) for the duration of the COVID-19 declaration under Section 564(b)(1) of the Act, 21 U.S.C. section 360bbb-3(b)(1), unless the authorization is terminated or revoked sooner.    Influenza A by PCR NEGATIVE NEGATIVE   Influenza B by PCR NEGATIVE NEGATIVE    Comment: (NOTE) The Xpert Xpress SARS-CoV-2/FLU/RSV assay is intended as an aid in  the diagnosis of influenza from Nasopharyngeal swab specimens and  should not be used as a sole basis for treatment. Nasal washings and  aspirates are unacceptable for Xpert Xpress SARS-CoV-2/FLU/RSV  testing. Fact Sheet for Patients: https://www.moore.com/ Fact Sheet for Healthcare Providers: https://www.young.biz/ This test is not yet approved or cleared by the [place] FDA and  has been authorized for detection and/or diagnosis of SARS-CoV-2 by  FDA under an Emergency Use Authorization (EUA). This EUA will remain  in effect (meaning this test can be used) for the duration of the  Covid-19 declaration under Section 564(b)(1) of the Act, 21  U.S.C. section 360bbb-3(b)(1), unless the authorization is  terminated or revoked. Performed at [facility], [address], [place], [place] [address]   Lactic acid, plasma     Status: Abnormal   Collection Time: 05/30/19  8:45 PM  Result Value Ref Range   Lactic Acid, Venous 2.1 (HH) 0.5 - 1.9 mmol/L    Comment: CRITICAL RESULT CALLED TO, READ BACK BY AND VERIFIED WITH: [place] 05/30/19 2144 WAYK Performed at [facility], [address] [place], [place] [address]    Recent Results (from the past 240 hour(s))  Respiratory Panel by RT PCR (Flu A&B, Covid) - Nasopharyngeal Swab     Status: None   Collection Time: 05/30/19  6:07 PM   Specimen: Nasopharyngeal Swab  Result Value Ref Range Status   SARS Coronavirus 2 by RT PCR NEGATIVE NEGATIVE Final    Comment: (NOTE) SARS-CoV-2 target nucleic acids are NOT DETECTED. The SARS-CoV-2 RNA is generally detectable in upper respiratoy specimens during the acute phase of infection. The lowest concentration of SARS-CoV-2 viral copies this assay can detect is 131 copies/mL. A negative result does not preclude SARS-Cov-2 infection and should not be used as the sole basis for treatment or other patient management decisions. A negative  result may occur with  improper specimen collection/handling, submission of specimen other than nasopharyngeal swab, presence of viral mutation(s) within the areas targeted by this assay, and inadequate number of viral copies (<131 copies/mL). A negative result must be combined with clinical observations, patient history, and epidemiological information. The expected result is Negative. Fact Sheet for Patients:  https://www.moore.com/ Fact Sheet for Healthcare Providers:  https://www.young.biz/ This test is not yet ap proved or cleared by the [place] FDA and  has been authorized for detection and/or diagnosis of SARS-CoV-2 by FDA under an Emergency Use Authorization (EUA). This EUA will remain  in effect (meaning this test can be used) for the duration of the COVID-19  declaration under Section 564(b)(1) of the Act, 21 U.S.C. section 360bbb-3(b)(1), unless the authorization is terminated or revoked sooner.    Influenza A by PCR NEGATIVE NEGATIVE Final   Influenza B by PCR NEGATIVE NEGATIVE Final    Comment: (NOTE) The Xpert Xpress SARS-CoV-2/FLU/RSV assay is intended as an aid in  the diagnosis of influenza from Nasopharyngeal swab specimens and  should not be used as a sole basis for treatment. Nasal washings and  aspirates are unacceptable for Xpert Xpress SARS-CoV-2/FLU/RSV  testing. Fact Sheet for Patients: https://www.moore.com/ Fact Sheet for Healthcare Providers: https://www.young.biz/ This test is not yet approved or cleared by the [place] FDA and  has been authorized for detection and/or diagnosis of SARS-CoV-2 by  FDA under an Emergency Use Authorization (EUA). This EUA will remain  in effect (meaning this test can be used) for the duration of the  Covid-19 declaration under Section 564(b)(1) of the Act, 21  U.S.C. section 360bbb-3(b)(1), unless the authorization is  terminated or revoked. Performed at [facility], [address], [place], [place] [address]    Creatinine: Recent Labs    05/30/19 1230  CREATININE 0.62   Baseline Creatinine: 0.6  Impression/Assessment:  53yo with a right ureteral calculus and intractable pain  Plan:  I discussed the management of the ureteral calculus with the patient. Since he has porr pain control we have elected to proceed with decompression of his right collecting system. I have discussed his case with Dr Loreta Ave and the patient will be schedule for right nephrostomy tube placement for Friday. He will be scheduled as an outpatient was antergrade ureteroscopy with stone extraction   Christopher [name] 05/30/2019, 11:40 PM

## 2019-05-30 NOTE — Progress Notes (Signed)
Received pt here in 6N, alert oriented with VSS. Pt with chronic left hip wound 1cm in depth x1.5 cm in length and chronic osteomyelitis of coccyx. Dressings changed on wounds. Pt crying of severe pain, given meds for pain and muscle spasms (See MAR). Pt oriented to room, bed controls and plan of care, left lying comfortably in bed with call bell at reach and will continue to monitor.

## 2019-05-30 NOTE — ED Triage Notes (Signed)
Pt from home, bed bound from crushed pelvis and hip infection which he is being treated for at home, has suprapubic cath and colostomy. Called out today for right lower abd pain with n/v, has hx of kidney stones.

## 2019-05-30 NOTE — ED Notes (Signed)
Attempted to call report. Nurse unavailable, will call back for report

## 2019-05-30 NOTE — Progress Notes (Addendum)
CRITICAL VALUE ALERT  Critical Value:  Lactic Acid= 2.1  Date & Time Notied:  05/30/2019 2148  Provider Notified: X. Blount NP   Orders Received/Actions taken: 1000 ml NS bolus

## 2019-05-31 ENCOUNTER — Encounter (HOSPITAL_COMMUNITY): Payer: Self-pay | Admitting: Internal Medicine

## 2019-05-31 ENCOUNTER — Inpatient Hospital Stay (HOSPITAL_COMMUNITY): Payer: No Typology Code available for payment source

## 2019-05-31 DIAGNOSIS — R319 Hematuria, unspecified: Secondary | ICD-10-CM

## 2019-05-31 DIAGNOSIS — N132 Hydronephrosis with renal and ureteral calculous obstruction: Principal | ICD-10-CM

## 2019-05-31 DIAGNOSIS — R110 Nausea: Secondary | ICD-10-CM | POA: Diagnosis not present

## 2019-05-31 DIAGNOSIS — B3749 Other urogenital candidiasis: Secondary | ICD-10-CM | POA: Diagnosis present

## 2019-05-31 DIAGNOSIS — Z20828 Contact with and (suspected) exposure to other viral communicable diseases: Secondary | ICD-10-CM | POA: Diagnosis present

## 2019-05-31 DIAGNOSIS — W230XXS Caught, crushed, jammed, or pinched between moving objects, sequela: Secondary | ICD-10-CM | POA: Diagnosis present

## 2019-05-31 DIAGNOSIS — F17211 Nicotine dependence, cigarettes, in remission: Secondary | ICD-10-CM | POA: Diagnosis not present

## 2019-05-31 DIAGNOSIS — B965 Pseudomonas (aeruginosa) (mallei) (pseudomallei) as the cause of diseases classified elsewhere: Secondary | ICD-10-CM | POA: Diagnosis not present

## 2019-05-31 DIAGNOSIS — Z933 Colostomy status: Secondary | ICD-10-CM | POA: Diagnosis not present

## 2019-05-31 DIAGNOSIS — N200 Calculus of kidney: Secondary | ICD-10-CM | POA: Diagnosis not present

## 2019-05-31 DIAGNOSIS — M869 Osteomyelitis, unspecified: Secondary | ICD-10-CM | POA: Diagnosis not present

## 2019-05-31 DIAGNOSIS — S329XXK Fracture of unspecified parts of lumbosacral spine and pelvis, subsequent encounter for fracture with nonunion: Secondary | ICD-10-CM | POA: Diagnosis not present

## 2019-05-31 DIAGNOSIS — S3282XS Multiple fractures of pelvis without disruption of pelvic ring, sequela: Secondary | ICD-10-CM | POA: Diagnosis not present

## 2019-05-31 DIAGNOSIS — E43 Unspecified severe protein-calorie malnutrition: Secondary | ICD-10-CM | POA: Diagnosis present

## 2019-05-31 DIAGNOSIS — T148XXA Other injury of unspecified body region, initial encounter: Secondary | ICD-10-CM | POA: Diagnosis not present

## 2019-05-31 DIAGNOSIS — B9689 Other specified bacterial agents as the cause of diseases classified elsewhere: Secondary | ICD-10-CM | POA: Diagnosis not present

## 2019-05-31 DIAGNOSIS — L089 Local infection of the skin and subcutaneous tissue, unspecified: Secondary | ICD-10-CM | POA: Diagnosis not present

## 2019-05-31 DIAGNOSIS — F419 Anxiety disorder, unspecified: Secondary | ICD-10-CM | POA: Diagnosis present

## 2019-05-31 DIAGNOSIS — F329 Major depressive disorder, single episode, unspecified: Secondary | ICD-10-CM | POA: Diagnosis present

## 2019-05-31 DIAGNOSIS — G894 Chronic pain syndrome: Secondary | ICD-10-CM | POA: Diagnosis present

## 2019-05-31 DIAGNOSIS — M4628 Osteomyelitis of vertebra, sacral and sacrococcygeal region: Secondary | ICD-10-CM | POA: Diagnosis present

## 2019-05-31 DIAGNOSIS — Z9359 Other cystostomy status: Secondary | ICD-10-CM | POA: Diagnosis not present

## 2019-05-31 DIAGNOSIS — N390 Urinary tract infection, site not specified: Secondary | ICD-10-CM | POA: Diagnosis not present

## 2019-05-31 DIAGNOSIS — R109 Unspecified abdominal pain: Secondary | ICD-10-CM | POA: Diagnosis not present

## 2019-05-31 DIAGNOSIS — M8668 Other chronic osteomyelitis, other site: Secondary | ICD-10-CM | POA: Diagnosis present

## 2019-05-31 DIAGNOSIS — Z681 Body mass index (BMI) 19 or less, adult: Secondary | ICD-10-CM | POA: Diagnosis not present

## 2019-05-31 DIAGNOSIS — B379 Candidiasis, unspecified: Secondary | ICD-10-CM | POA: Diagnosis not present

## 2019-05-31 DIAGNOSIS — S3730XS Unspecified injury of urethra, sequela: Secondary | ICD-10-CM | POA: Diagnosis not present

## 2019-05-31 DIAGNOSIS — Z803 Family history of malignant neoplasm of breast: Secondary | ICD-10-CM | POA: Diagnosis not present

## 2019-05-31 DIAGNOSIS — L89159 Pressure ulcer of sacral region, unspecified stage: Secondary | ICD-10-CM | POA: Diagnosis present

## 2019-05-31 DIAGNOSIS — W230XXD Caught, crushed, jammed, or pinched between moving objects, subsequent encounter: Secondary | ICD-10-CM | POA: Diagnosis present

## 2019-05-31 DIAGNOSIS — Z87891 Personal history of nicotine dependence: Secondary | ICD-10-CM | POA: Diagnosis not present

## 2019-05-31 HISTORY — PX: IR NEPHROSTOMY PLACEMENT RIGHT: IMG6064

## 2019-05-31 LAB — COMPREHENSIVE METABOLIC PANEL
ALT: 36 U/L (ref 0–44)
AST: 24 U/L (ref 15–41)
Albumin: 2.6 g/dL — ABNORMAL LOW (ref 3.5–5.0)
Alkaline Phosphatase: 151 U/L — ABNORMAL HIGH (ref 38–126)
Anion gap: 9 (ref 5–15)
BUN: 19 mg/dL (ref 6–20)
CO2: 20 mmol/L — ABNORMAL LOW (ref 22–32)
Calcium: 9.8 mg/dL (ref 8.9–10.3)
Chloride: 111 mmol/L (ref 98–111)
Creatinine, Ser: 0.63 mg/dL (ref 0.61–1.24)
GFR calc Af Amer: >60 mL/min (ref >60)
GFR calc non Af Amer: >60 mL/min (ref >60)
Glucose, Bld: 100 mg/dL — ABNORMAL HIGH (ref 70–99)
Potassium: 3.7 mmol/L (ref 3.5–5.1)
Sodium: 140 mmol/L (ref 135–145)
Total Bilirubin: 0.6 mg/dL (ref 0.3–1.2)
Total Protein: 6.4 g/dL — ABNORMAL LOW (ref 6.5–8.1)

## 2019-05-31 LAB — URINE CULTURE: Culture: >=100000 — AB

## 2019-05-31 LAB — CBC
HCT: 38.2 % — ABNORMAL LOW (ref 39.0–52.0)
Hemoglobin: 12 g/dL — ABNORMAL LOW (ref 13.0–17.0)
MCH: 32.1 pg (ref 26.0–34.0)
MCHC: 31.4 g/dL (ref 30.0–36.0)
MCV: 102.1 fL — ABNORMAL HIGH (ref 80.0–100.0)
Platelets: 243 10*3/uL (ref 150–400)
RBC: 3.74 MIL/uL — ABNORMAL LOW (ref 4.22–5.81)
RDW: 12.8 % (ref 11.5–15.5)
WBC: 6.6 10*3/uL (ref 4.0–10.5)
nRBC: 0 % (ref 0.0–0.2)

## 2019-05-31 LAB — PROTIME-INR
INR: 1.1 (ref 0.8–1.2)
Prothrombin Time: 14.4 seconds (ref 11.4–15.2)

## 2019-05-31 MED ORDER — JUVEN PO PACK
1.0000 | PACK | Freq: Two times a day (BID) | ORAL | Status: DC
  Filled 2019-05-31 (×10): qty 1

## 2019-05-31 MED ORDER — MIDAZOLAM HCL 2 MG/2ML IJ SOLN
INTRAMUSCULAR | Status: AC
  Filled 2019-05-31: qty 2

## 2019-05-31 MED ORDER — FENTANYL CITRATE (PF) 100 MCG/2ML IJ SOLN
INTRAMUSCULAR | Status: AC | PRN
  Administered 2019-05-31 (×3): 50 ug via INTRAVENOUS

## 2019-05-31 MED ORDER — SODIUM CHLORIDE 0.9% FLUSH
5.0000 mL | Freq: Three times a day (TID) | INTRAVENOUS | Status: DC
  Administered 2019-06-02 – 2019-06-05 (×4): 5 mL

## 2019-05-31 MED ORDER — HYDROMORPHONE HCL 1 MG/ML IJ SOLN
1.0000 mg | INTRAMUSCULAR | Status: DC | PRN
  Administered 2019-05-31 – 2019-06-01 (×5): 1 mg via INTRAVENOUS
  Administered 2019-06-02 – 2019-06-04 (×15): 2 mg via INTRAVENOUS
  Administered 2019-06-05 (×3): 1 mg via INTRAVENOUS
  Administered 2019-06-05: 2 mg via INTRAVENOUS
  Filled 2019-05-31 (×2): qty 2
  Filled 2019-05-31: qty 1
  Filled 2019-05-31: qty 2
  Filled 2019-05-31: qty 1
  Filled 2019-05-31 (×2): qty 2
  Filled 2019-05-31 (×3): qty 1
  Filled 2019-05-31 (×2): qty 2
  Filled 2019-05-31: qty 1
  Filled 2019-05-31 (×2): qty 2
  Filled 2019-05-31: qty 1
  Filled 2019-05-31 (×3): qty 2
  Filled 2019-05-31: qty 1
  Filled 2019-05-31 (×5): qty 2

## 2019-05-31 MED ORDER — FENTANYL CITRATE (PF) 100 MCG/2ML IJ SOLN
INTRAMUSCULAR | Status: AC
  Filled 2019-05-31: qty 2

## 2019-05-31 MED ORDER — LIDOCAINE HCL 1 % IJ SOLN
INTRAMUSCULAR | Status: AC
  Filled 2019-05-31: qty 20

## 2019-05-31 MED ORDER — LIDOCAINE HCL (PF) 1 % IJ SOLN
INTRAMUSCULAR | Status: AC | PRN
  Administered 2019-05-31: 5 mL

## 2019-05-31 MED ORDER — IOHEXOL 300 MG/ML  SOLN
50.0000 mL | Freq: Once | INTRAMUSCULAR | Status: AC | PRN
  Administered 2019-05-31: 15 mL

## 2019-05-31 MED ORDER — HYDROMORPHONE HCL 1 MG/ML IJ SOLN
1.0000 mg | INTRAMUSCULAR | Status: DC | PRN
  Administered 2019-05-31: 1 mg via INTRAVENOUS
  Filled 2019-05-31: qty 1

## 2019-05-31 MED ORDER — MIDAZOLAM HCL 2 MG/2ML IJ SOLN
INTRAMUSCULAR | Status: AC | PRN
  Administered 2019-05-31 (×3): 1 mg via INTRAVENOUS
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
## 2019-05-31 NOTE — Procedures (Signed)
Interventional Radiology Procedure Note  Procedure: Right percutaneous nephrostomy tube placement  Complications: None  Estimated Blood Loss: < 10 mL  Findings: 10 Fr PCN placed via LP calyx. Attached to gravity bag.  Venetia Night. Kathlene Cote, M.D Pager:  [phone]
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
## 2019-06-01 ENCOUNTER — Other Ambulatory Visit: Payer: Self-pay | Admitting: Physical Medicine and Rehabilitation

## 2019-06-01 DIAGNOSIS — E43 Unspecified severe protein-calorie malnutrition: Secondary | ICD-10-CM | POA: Diagnosis present

## 2019-06-01 MED ORDER — OXYCODONE-ACETAMINOPHEN 5-325 MG PO TABS
1.0000 | ORAL_TABLET | ORAL | Status: DC
  Administered 2019-06-01 – 2019-06-05 (×20): 1 via ORAL
  Filled 2019-06-01 (×20): qty 1

## 2019-06-01 MED ORDER — SODIUM CHLORIDE 0.9 % IV SOLN
1.0000 g | INTRAVENOUS | Status: DC
  Administered 2019-06-01 – 2019-06-02 (×2): 1 g via INTRAVENOUS
  Filled 2019-06-01: qty 10
  Filled 2019-06-01 (×2): qty 1

## 2019-06-01 MED ORDER — QUETIAPINE FUMARATE 50 MG PO TABS
25.0000 mg | ORAL_TABLET | Freq: Every day | ORAL | Status: DC
  Administered 2019-06-01 – 2019-06-04 (×4): 25 mg via ORAL
  Filled 2019-06-01 (×4): qty 1

## 2019-06-01 MED ORDER — PANTOPRAZOLE SODIUM 40 MG PO TBEC
40.0000 mg | DELAYED_RELEASE_TABLET | Freq: Two times a day (BID) | ORAL | Status: DC
  Administered 2019-06-01 – 2019-06-05 (×8): 40 mg via ORAL
  Filled 2019-06-01 (×8): qty 1

## 2019-06-01 MED ORDER — FLUCONAZOLE 100 MG PO TABS
100.0000 mg | ORAL_TABLET | Freq: Every day | ORAL | Status: DC
  Administered 2019-06-02 – 2019-06-03 (×2): 100 mg via ORAL
  Filled 2019-06-01 (×2): qty 1

## 2019-06-01 MED ORDER — FLUCONAZOLE 100MG IVPB
100.0000 mg | INTRAVENOUS | Status: AC
  Administered 2019-06-01: 100 mg via INTRAVENOUS
  Filled 2019-06-01: qty 50
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
## 2019-06-02 DIAGNOSIS — R109 Unspecified abdominal pain: Secondary | ICD-10-CM

## 2019-06-02 DIAGNOSIS — R110 Nausea: Secondary | ICD-10-CM

## 2019-06-02 DIAGNOSIS — Z8619 Personal history of other infectious and parasitic diseases: Secondary | ICD-10-CM

## 2019-06-02 DIAGNOSIS — L089 Local infection of the skin and subcutaneous tissue, unspecified: Secondary | ICD-10-CM

## 2019-06-02 DIAGNOSIS — Z8781 Personal history of (healed) traumatic fracture: Secondary | ICD-10-CM

## 2019-06-02 DIAGNOSIS — Z87828 Personal history of other (healed) physical injury and trauma: Secondary | ICD-10-CM

## 2019-06-02 DIAGNOSIS — Z933 Colostomy status: Secondary | ICD-10-CM

## 2019-06-02 DIAGNOSIS — Z960 Presence of urogenital implants: Secondary | ICD-10-CM

## 2019-06-02 DIAGNOSIS — F17211 Nicotine dependence, cigarettes, in remission: Secondary | ICD-10-CM

## 2019-06-02 LAB — CBC
HCT: 33.9 % — ABNORMAL LOW (ref 39.0–52.0)
Hemoglobin: 11.1 g/dL — ABNORMAL LOW (ref 13.0–17.0)
MCH: 32.5 pg (ref 26.0–34.0)
MCHC: 32.7 g/dL (ref 30.0–36.0)
MCV: 99.1 fL (ref 80.0–100.0)
Platelets: 209 10*3/uL (ref 150–400)
RBC: 3.42 MIL/uL — ABNORMAL LOW (ref 4.22–5.81)
RDW: 12.1 % (ref 11.5–15.5)
WBC: 4.3 10*3/uL (ref 4.0–10.5)
nRBC: 0 % (ref 0.0–0.2)

## 2019-06-02 LAB — BASIC METABOLIC PANEL
Anion gap: 8 (ref 5–15)
BUN: 10 mg/dL (ref 6–20)
CO2: 23 mmol/L (ref 22–32)
Calcium: 9.3 mg/dL (ref 8.9–10.3)
Chloride: 110 mmol/L (ref 98–111)
Creatinine, Ser: 0.39 mg/dL — ABNORMAL LOW (ref 0.61–1.24)
GFR calc Af Amer: >60 mL/min (ref >60)
GFR calc non Af Amer: >60 mL/min (ref >60)
Glucose, Bld: 91 mg/dL (ref 70–99)
Potassium: 2.8 mmol/L — ABNORMAL LOW (ref 3.5–5.1)
Sodium: 141 mmol/L (ref 135–145)

## 2019-06-02 LAB — LACTIC ACID, PLASMA
Lactic Acid, Venous: 1.2 mmol/L (ref 0.5–1.9)
Lactic Acid, Venous: 1.4 mmol/L (ref 0.5–1.9)

## 2019-06-02 MED ORDER — POTASSIUM CHLORIDE CRYS ER 20 MEQ PO TBCR
40.0000 meq | EXTENDED_RELEASE_TABLET | ORAL | Status: AC
  Administered 2019-06-02 (×3): 40 meq via ORAL
  Filled 2019-06-02 (×3): qty 2

## 2019-06-02 NOTE — Progress Notes (Signed)
Referring Physician(s): Wilkie Aye  Supervising Physician: Malachy Moan  Patient Status:  [facility] - In-pt  Chief Complaint:  Right hydronephrosis  Brief History:  Christopher [name] is a 53yo with history of nephrolithiasis.  Also has prior crush injury to pelvis resulting in chronic suprapubic catheter.  He has had prior left PCN a couple months ago that was removed after stone extraction.  He now has right hydronephrosis from obstructing stone.  He underwent placement of a right PCN by Dr. Fredia Sorrow on 05/31/2019  Subjective:  Doing ok. No complains. Wondering when the PCN can be removed.   Allergies: Patient has no known allergies.  Medications: Prior to Admission medications   Medication Sig Start Date End Date Taking? Authorizing Provider  acetaminophen (TYLENOL) 325 MG tablet Take 2 tablets (650 mg total) by mouth every 6 (six) hours as needed for mild pain (or Fever >/= 101). 03/12/19  Yes Buriev, Isaiah Serge, MD  albuterol (VENTOLIN HFA) 108 (90 Base) MCG/ACT inhaler Inhale 2 puffs into the lungs every 6 (six) hours as needed for wheezing or shortness of breath. 03/26/19  Yes Ardith Dark, MD  Amino Acids-Protein Hydrolys (FEEDING SUPPLEMENT, PRO-STAT SUGAR FREE 64,) LIQD Take 30 mLs by mouth 3 (three) times daily with meals. 03/26/19  Yes Ardith Dark, MD  citalopram (CELEXA) 20 MG tablet Take 1 tablet (20 mg total) by mouth daily. For mood/anxiety 01/29/19 01/29/20 Yes Lovorn, Aundra Millet, MD  clonazePAM (KLONOPIN) 0.5 MG tablet Take 0.5 tablets (0.25 mg total) by mouth 3 (three) times daily as needed (anxiety). 02/26/19  Yes Lovorn, Megan, MD  [employer] MELATONIN 3 MG TABS TAKE 1 TABLET (3 MG TOTAL) BY MOUTH AT BEDTIME. Patient taking differently: Take 3 mg by mouth at bedtime.  02/12/19  Yes Lovorn, Aundra Millet, MD  doxycycline (VIBRA-TABS) 100 MG tablet Take 1 tablet (100 mg total) by mouth 2 (two) times daily. 05/23/19 07/04/19 Yes Young, Johanna C, PA-C  dronabinol (MARINOL)  2.5 MG capsule Take 1 capsule (2.5 mg total) by mouth 2 (two) times daily before a meal. 04/25/19  Yes Lovorn, Megan, MD  fentaNYL (DURAGESIC) 50 MCG/HR Place 1 patch onto the skin every other day. 05/23/19 06/22/19 Yes Lovorn, Aundra Millet, MD  gabapentin (NEURONTIN) 300 MG capsule TAKE 1 CAPSULE BY MOUTH THREE TIMES A DAY Patient taking differently: Take 300 mg by mouth 3 (three) times daily.  02/12/19  Yes Lovorn, Aundra Millet, MD  guaiFENesin (MUCINEX) 600 MG 12 hr tablet Take 2 tablets (1,200 mg total) by mouth 2 (two) times daily. 03/26/19  Yes Ardith Dark, MD  lidocaine (LIDODERM) 5 % Place 3 patches onto the skin every 12 (twelve) hours. Remove & Discard patch within 12 hours or as directed by MD Patient taking differently: Place 3 patches onto the skin daily as needed (pain). Remove & Discard patch within 12 hours or as directed by MD 05/03/19 05/02/20 Yes Lovorn, Aundra Millet, MD  methocarbamol (ROBAXIN) 500 MG tablet TAKE 1 TABLET BY MOUTH EVERY 6 HOURS AS NEEDED FOR MUSCLE SPASMS Patient taking differently: Take 500 mg by mouth every 6 (six) hours as needed for muscle spasms.  05/14/19  Yes Lovorn, Aundra Millet, MD  Multiple Vitamin (MULTIVITAMIN WITH MINERALS) TABS tablet Take 1 tablet by mouth daily. 03/13/19  Yes Buriev, Isaiah Serge, MD  omeprazole (PRILOSEC) 20 MG capsule Take 20 mg by mouth daily.   Yes [provider]  ondansetron (ZOFRAN) 4 MG tablet Take 1-2 tablets (4-8 mg total) by mouth every 8 (eight) hours  as needed for nausea, vomiting or refractory nausea / vomiting (max dose in a day is 24 mg total). 05/03/19 05/02/20 Yes Lovorn, Aundra MilletMegan, MD  oxyCODONE-acetaminophen (PERCOCET) 5-325 MG tablet Take 1 tablet by mouth every 4 (four) hours as needed for severe pain. 05/23/19 05/22/20 Yes Lovorn, Megan, MD  polyethylene glycol (MIRALAX / GLYCOLAX) 17 g packet MIX AND TAKE 17 G BY MOUTH DAILY. Patient taking differently: Take 17 g by mouth daily.  02/12/19  Yes Jones Baleshomas, Eunice L, NP  prochlorperazine  (COMPAZINE) 5 MG tablet Take 1-2 tablets (5-10 mg total) by mouth every 6 (six) hours as needed for nausea. 12/12/18  Yes Love, Evlyn KannerPamela S, PA-C  QUEtiapine (SEROQUEL) 25 MG tablet Take 25 mg by mouth at bedtime. 05/11/19  Yes [provider]  senna-docusate (SENOKOT-S) 8.6-50 MG tablet Take 2 tablets by mouth at bedtime. 12/12/18  Yes Love, Evlyn KannerPamela S, PA-C     Vital Signs: BP (!) 141/72 (BP Location: Left Arm)   Pulse (!) 55   Temp (!) 97.5 F (36.4 C)   Resp 16   Ht 6\' 3"  (1.905 m)   Wt 59 kg   SpO2 98%   BMI 16.26 kg/m   Physical Exam Vitals reviewed.  Cardiovascular:     Rate and Rhythm: Normal rate.  Pulmonary:     Effort: Pulmonary effort is normal. No respiratory distress.  Genitourinary:    Comments: Right PCN in place. Urine dark but clear. No obvious hematuria. Neurological:     General: No focal deficit present.     Mental Status: He is alert and oriented to person, place, and time.  Psychiatric:        Mood and Affect: Mood normal.        Behavior: Behavior normal.        Thought Content: Thought content normal.        Judgment: Judgment normal.     Imaging: DG Chest 2 View  Result Date: 05/30/2019 CLINICAL DATA:  Cough EXAM: CHEST - 2 VIEW COMPARISON:  04/11/2019 FINDINGS: Heart and mediastinal contours are within normal limits. No focal opacities or effusions. No acute bony abnormality. IMPRESSION: No active cardiopulmonary disease. Electronically Signed   By: Charlett NoseKevin  Dover M.D.   On: 05/30/2019 18:08   CT Renal Stone Study  Result Date: 05/30/2019 CLINICAL DATA:  History of urinary tract stones. Right side abdominal pain. EXAM: CT ABDOMEN AND PELVIS WITHOUT CONTRAST TECHNIQUE: Multidetector CT imaging of the abdomen and pelvis was performed following the standard protocol without IV contrast. COMPARISON:  CT abdomen and pelvis 04/27/2019. FINDINGS: Lower chest: Pleural thickening, calcifications and trace left pleural effusion are unchanged. Mild basilar  atelectasis on the right. Heart size is normal. No pericardial effusion. Hepatobiliary: No focal liver abnormality is seen. No gallstones, gallbladder wall thickening, or biliary dilatation. Pancreas: Unremarkable. No pancreatic ductal dilatation or surrounding inflammatory changes. Spleen: Normal in size without focal abnormality. Adrenals/Urinary Tract: Small, low attenuating right adrenal nodule most consistent with an adenoma is unchanged. The left adrenal gland appears normal. The patient has moderate right hydronephrosis with stranding about the right kidney and ureter due to a 0.7 cm stone in the mid ureter at the level of the superior endplate of L5. No left renal or ureteral stones are identified. The urinary bladder is completely decompressed with a suprapubic catheter in place. Bladder stones are less numerous than on the prior CT. The left kidney and ureter appear normal. Stomach/Bowel: Stomach is within normal limits. Appendix appears normal.  No evidence of bowel wall thickening, distention, or inflammatory changes. Left lower quadrant colostomy again seen. Vascular/Lymphatic: Aortic atherosclerosis. No enlarged abdominal or pelvic lymph nodes. Reproductive: Prostate is unremarkable. Other: None. Musculoskeletal: Sacral decubitus ulcer and findings consistent with chronic osteomyelitis of the coccyx again seen. 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 Extensive posttraumatic change in the pelvis is again seen. Fracture fixation hardware in the left hip and pelvis noted. Skin thickening over the left buttock is unchanged. IMPRESSION: Moderate right hydronephrosis due to a 0.7 cm stone mid left ureteral stone at the level of the superior endplate of L5. Decrease in the number of urinary bladder stone since the prior examination. Extensive, chronic posttraumatic change. Sacral decubitus ulcer with findings consistent with chronic  osteomyelitis in the coccyx. Electronically Signed   By: Inge Rise M.D.   On: 05/30/2019 13:30   IR NEPHROSTOMY PLACEMENT RIGHT  Result Date: 05/31/2019 CLINICAL DATA:  Right hydronephrosis secondary to obstructing ureteral calculus. EXAM: 1. ULTRASOUND GUIDANCE FOR PUNCTURE OF THE RIGHT RENAL COLLECTING SYSTEM. 2. LEFT PERCUTANEOUS NEPHROSTOMY TUBE PLACEMENT. COMPARISON:  CT of the abdomen and pelvis on 05/30/2019 ANESTHESIA/SEDATION: 3.0 mg IV Versed; 150 mcg IV Fentanyl. Total Moderate Sedation Time 20 minutes. The patient's level of consciousness and physiologic status were continuously monitored during the procedure by Radiology nursing. CONTRAST:  Fifteen ml Omnipaque 300 MEDICATIONS: None FLUOROSCOPY TIME:  2 minutes and 36 seconds. 6.0 mGy. PROCEDURE: The procedure, risks, benefits, and alternatives were explained to the patient. Questions regarding the procedure were encouraged and answered. The patient understands and consents to the procedure. A time-out was performed prior to initiating the procedure. Ultrasound was used to localize the right kidney. The right flank region was prepped with chlorhexidine in a sterile fashion, and a sterile drape was applied covering the operative field. A sterile gown and sterile gloves were used for the procedure. Local anesthesia was provided with 1% Lidocaine. Ultrasound was used to localize the right kidney. Under direct ultrasound guidance, a 21 gauge needle was advanced into the renal collecting system. Ultrasound image documentation was performed. Aspiration of urine sample was performed followed by contrast injection. A transitional dilator was advanced over a guidewire. Percutaneous tract dilatation was then performed over the guidewire. A 10-French percutaneous nephrostomy tube was then advanced and formed in the collecting system. Catheter position was confirmed by fluoroscopy after contrast injection. The catheter was secured at the skin with a  Prolene retention suture and Stat-Lock device. A gravity bag was placed. COMPLICATIONS: None. FINDINGS: Ultrasound demonstrates moderate hydronephrosis of the right kidney. After lower pole access, a 10 French nephrostomy tube was placed and formed at the level of the renal pelvis. Initial urine return is slightly blood tinged after tube placement. The nephrostomy tube will be left to gravity bag drainage. IMPRESSION: Placement of 10 French nephrostomy tube in the right kidney via lower pole access. The nephrostomy tube will be left to gravity bag drainage. Electronically Signed   By: Aletta Edouard M.D.   On: 05/31/2019 16:16    Labs:  CBC: Recent Labs    04/27/19 1319 05/30/19 1230 05/31/19 0209 06/02/19 0257  WBC 5.0 10.6* 6.6 4.3  HGB 13.9 14.4 12.0* 11.1*  HCT 43.7 44.6 38.2* 33.9*  PLT 294 327 243 209    COAGS: Recent Labs    07/10/18 1900 07/10/18 2137 07/11/18 0343 07/11/18 1351 07/12/18 0426 10/17/18 0529 05/31/19 1031  INR 1.39 1.41  --  1.52 1.53 1.0 1.1  APTT 30 56* 39*  --   --   --   --     BMP: Recent Labs    04/27/19 1319 05/30/19 1230 05/31/19 0209 06/02/19 0257  NA 136 140 140 141  K 4.3 3.5 3.7 2.8*  CL 106 106 111 110  CO2 21* 24 20* 23  GLUCOSE 118* 110* 100* 91  BUN CALCIUM 9.5 10.2 9.8 9.3  CREATININE 0.55* 0.62 0.63 0.39*  GFRNONAA >60 >60 >60 >60  GFRAA >60 >60 >60 >60    LIVER FUNCTION TESTS: Recent Labs    04/04/19 1854 04/11/19 1350 05/30/19 1230 05/31/19 0209  BILITOT 0.6 0.6 0.4 0.6  AST ALT 19 15 39 36  ALKPHOS 102 88 187* 151*  PROT 7.9 7.0 7.7 6.4*  ALBUMIN 3.0* 2.9* 3.1* 2.6*    Assessment and Plan:   Right hydronephrosis secondary to obstructing renal calculus.  Right PCN by Dr. Fredia Sorrow on 05/31/2019.  Explained PCN could possibly removed after ureteroscopy with stone extraction - defer to Urology.  Electronically Signed: Gwynneth Macleod, PA-C 06/02/2019, 11:27 AM    I spent a  total of 15 Minutes at the the patient's bedside AND on the patient's hospital floor or unit, greater than 50% of which was counseling/coordinating care for f/u Right PCN.
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
## 2019-06-03 ENCOUNTER — Encounter: Payer: No Typology Code available for payment source | Admitting: Physical Medicine and Rehabilitation

## 2019-06-03 DIAGNOSIS — B9689 Other specified bacterial agents as the cause of diseases classified elsewhere: Secondary | ICD-10-CM

## 2019-06-03 DIAGNOSIS — L089 Local infection of the skin and subcutaneous tissue, unspecified: Secondary | ICD-10-CM

## 2019-06-03 DIAGNOSIS — M8668 Other chronic osteomyelitis, other site: Secondary | ICD-10-CM

## 2019-06-03 DIAGNOSIS — T148XXA Other injury of unspecified body region, initial encounter: Secondary | ICD-10-CM

## 2019-06-03 DIAGNOSIS — Z936 Other artificial openings of urinary tract status: Secondary | ICD-10-CM

## 2019-06-03 DIAGNOSIS — B379 Candidiasis, unspecified: Secondary | ICD-10-CM

## 2019-06-03 DIAGNOSIS — B965 Pseudomonas (aeruginosa) (mallei) (pseudomallei) as the cause of diseases classified elsewhere: Secondary | ICD-10-CM

## 2019-06-03 DIAGNOSIS — R64 Cachexia: Secondary | ICD-10-CM

## 2019-06-03 DIAGNOSIS — Z87442 Personal history of urinary calculi: Secondary | ICD-10-CM

## 2019-06-03 LAB — CBC WITH DIFFERENTIAL/PLATELET
Abs Immature Granulocytes: 0.01 10*3/uL (ref 0.00–0.07)
Basophils Absolute: 0 10*3/uL (ref 0.0–0.1)
Basophils Relative: 1 %
Eosinophils Absolute: 0.4 10*3/uL (ref 0.0–0.5)
Eosinophils Relative: 9 %
HCT: 36.6 % — ABNORMAL LOW (ref 39.0–52.0)
Hemoglobin: 11.9 g/dL — ABNORMAL LOW (ref 13.0–17.0)
Immature Granulocytes: 0 %
Lymphocytes Relative: 20 %
Lymphs Abs: 0.9 10*3/uL (ref 0.7–4.0)
MCH: 32.5 pg (ref 26.0–34.0)
MCHC: 32.5 g/dL (ref 30.0–36.0)
MCV: 100 fL (ref 80.0–100.0)
Monocytes Absolute: 0.3 10*3/uL (ref 0.1–1.0)
Monocytes Relative: 6 %
Neutro Abs: 2.8 10*3/uL (ref 1.7–7.7)
Neutrophils Relative %: 64 %
Platelets: 270 10*3/uL (ref 150–400)
RBC: 3.66 MIL/uL — ABNORMAL LOW (ref 4.22–5.81)
RDW: 12 % (ref 11.5–15.5)
WBC: 4.3 10*3/uL (ref 4.0–10.5)
nRBC: 0 % (ref 0.0–0.2)

## 2019-06-03 LAB — SEDIMENTATION RATE: Sed Rate: 40 mm/hr — ABNORMAL HIGH (ref 0–16)

## 2019-06-03 LAB — BASIC METABOLIC PANEL
Anion gap: 8 (ref 5–15)
BUN: 8 mg/dL (ref 6–20)
CO2: 23 mmol/L (ref 22–32)
Calcium: 9.5 mg/dL (ref 8.9–10.3)
Chloride: 109 mmol/L (ref 98–111)
Creatinine, Ser: 0.58 mg/dL — ABNORMAL LOW (ref 0.61–1.24)
GFR calc Af Amer: >60 mL/min (ref >60)
GFR calc non Af Amer: >60 mL/min (ref >60)
Glucose, Bld: 158 mg/dL — ABNORMAL HIGH (ref 70–99)
Potassium: 3.6 mmol/L (ref 3.5–5.1)
Sodium: 140 mmol/L (ref 135–145)

## 2019-06-03 LAB — AEROBIC CULTURE W GRAM STAIN (SUPERFICIAL SPECIMEN)

## 2019-06-03 LAB — C-REACTIVE PROTEIN: CRP: 5.1 mg/dL — ABNORMAL HIGH (ref <1.0)

## 2019-06-03 MED ORDER — SODIUM CHLORIDE 0.9 % IV SOLN
2.0000 g | INTRAVENOUS | Status: DC
  Administered 2019-06-03 – 2019-06-04 (×2): 2 g via INTRAVENOUS
  Filled 2019-06-03: qty 2
  Filled 2019-06-03: qty 20

## 2019-06-03 NOTE — Progress Notes (Signed)
Ortho Trauma Progress Note  Patient seen and examined.  He is well-known to me as well as Dr. Marla Roe.  He continues with some increased drainage and pain.  He states that it worsened since he stopped the antibiotics.  The last time I saw him was right around that time that he had stopped the IV antibiotics in the beginning of November 2020.  He has been seen by the plastic surgery team and has been on a few oral antibiotics.  He is currently admitted for his pyelonephritis and his chronic wound is being worked up as well.  He has a very difficult problem.  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  Due to his severe soft tissue injury any formal excision or debridement that would adequately remove the infection is not viable.  Smaller debridements can be performed however he has made progress with closure of his wound.  I also feel that smaller debridements would not adequately excise enough of the infected bone to clear the infection entirely.  I also feel that long-term IV antibiotics is not a good solution because I do not feel that he will ultimately clear his infection through antibiotics alone.  I would suggest suppression with oral antibiotics as long as the patient continues to have minimal clinical symptoms, specifically pain and increased drainage.  I will defer to the infectious disease team for their especially in regards to the antibiotic therapy.  If he continues to not improved significantly we can revisit the potential of a larger excision and debridement in the future however I would like to avoid this due to his soft tissue envelope.  Shona Needles, MD [facility] [phone] (office) [email or web address]

## 2019-06-03 NOTE — TOC Progression Note (Signed)
Transition of Care [facility]) - Progression Note    Patient Details  Name: Christopher [name] MRN: [id number] Date of Birth: 01/23/1966  Transition of Care [facility]) CM/SW Contact  Jacalyn Lefevre Edson Snowball, RN Phone Number: 06/03/2019, 8:51 AM  Clinical Narrative:     Patient may need long term IV ABX at home again. Called Workers Comp case Therapist, art , she wants to use [facility] ( [facility]). [name] aware.   Ernie Hew with [facility] , she is aware.  Expected Discharge Plan: [place] Barriers to Discharge: Continued Medical Work up  Expected Discharge Plan and Services Expected Discharge Plan: [place]   Discharge Planning Services: CM Consult Post Acute Care Choice: [place] arrangements for the past 2 months: Single Family Home                 DME Arranged: N/A                     Social Determinants of Health (SDOH) Interventions    Readmission Risk Interventions Readmission Risk Prevention Plan 04/06/2019 10/16/2018  Transportation Screening - Complete  PCP or Specialist Appt within 3-5 Days Complete -  HRI or Home Care Consult Complete -  Social Work Consult for [place] Planning/Counseling Complete -  Palliative Care Screening Not Applicable -  Medication Review Press photographer) Complete Complete  PCP or Specialist appointment within 3-5 days of discharge - Not Complete  PCP/Specialist Appt Not Complete comments - Pt discharging to [place] or [place] - Not Complete  HRI or Home Care Consult Pt Refusal Comments - Pt discharging to [place] - Not Complete  SW Consult Not Complete Comments - Pt discharging to [place] - Not Applicable  Some recent data might be hidden

## 2019-06-03 NOTE — Progress Notes (Signed)
Subjective: Resting in bed this AM eating breakfast. Reports feeling better in regards to right sided flank pain. Left hip pain also feeling a little better, continuing to drain.  He is hoping to walk some today and transfer to chair.  No fevers, chills.  Objective: Vital signs in last 24 hours: Temp:  [97.3 F (36.3 C)-98.2 F (36.8 C)] 97.3 F (36.3 C) (12/21 0613) Pulse Rate:  [61-71] 61 (12/21 0613) Resp:  [17-18] 18 (12/21 0613) BP: (140-147)/(74-88) 147/88 (12/21 0613) SpO2:  [97 %-99 %] 97 % (12/21 0613) Last BM Date: 06/03/19  Intake/Output from previous day: 12/20 0701 - 12/21 0700 In: 790 [P.O.:790] Out: 3525 [Urine:2375; Stool:1150] Intake/Output this shift: No intake/output data recorded.  General appearance: alert, cooperative and no distress GI: colostomy in place, suprapubic cath in place - yellow urine in bag, right nephrostomy tube in place. Extremities: extremities normal, atraumatic, no cyanosis or edema and thin, 2+ DP pulse Pulses: 2+ and symmetric Incision/Wound: Wound dressing in place.  Lab Results:  CBC Latest Ref Rng & Units 06/02/2019 05/31/2019 05/30/2019  WBC 4.0 - 10.5 K/uL 4.3 6.6 10.6(H)  Hemoglobin 13.0 - 17.0 g/dL 11.1(L) 12.0(L) 14.4  Hematocrit 39.0 - 52.0 % 33.9(L) 38.2(L) 44.6  Platelets 150 - 400 K/uL 209 243 327    BMET Recent Labs    06/02/19 0257  NA 141  K 2.8*  CL 110  CO2 23  GLUCOSE 91  BUN 10  CREATININE 0.39*  CALCIUM 9.3   PT/INR Recent Labs    05/31/19 1031  LABPROT 14.4  INR 1.1   ABG No results for input(s): PHART, HCO3 in the last 72 hours.  Invalid input(s): PCO2, PO2  Studies/Results: No results found.  Anti-infectives: Anti-infectives (From admission, onward)   Start     Dose/Rate Route Frequency Ordered Stop   06/02/19 1000  fluconazole (DIFLUCAN) tablet 100 mg     100 mg Oral Daily 06/01/19 1040     06/01/19 1000  fluconazole (DIFLUCAN) IVPB 100 mg     100 mg 50 mL/hr over 60 Minutes  Intravenous Every 24 hours 06/01/19 0903 06/01/19 1221   06/01/19 0915  cefTRIAXone (ROCEPHIN) 1 g in sodium chloride 0.9 % 100 mL IVPB     1 g 200 mL/hr over 30 Minutes Intravenous Every 24 hours 06/01/19 0905     05/31/19 1000  cefTRIAXone (ROCEPHIN) 1 g in sodium chloride 0.9 % 100 mL IVPB  Status:  Discontinued     1 g 200 mL/hr over 30 Minutes Intravenous Every 24 hours 05/30/19 1644 06/01/19 0903   05/30/19 2200  doxycycline (VIBRA-TABS) tablet 100 mg     100 mg Oral 2 times daily 05/30/19 1938     05/30/19 1430  cefTRIAXone (ROCEPHIN) 1 g in sodium chloride 0.9 % 100 mL IVPB     1 g 200 mL/hr over 30 Minutes Intravenous  Once 05/30/19 1419 05/30/19 1517      Assessment/Plan:  Chronic L hip wound Appreciate ID consult, will await their recommendations for antibiotic regimen. 12/18 cultures pending, few gram neg rods.  Continue with local wound care for L hip wound (wtd, abd, medipore tape), groin wound (alginate). No surgical management planned.  Spoke with melanie, patient's wife to update on current status/plan.  Recommended patient to wear SCDs as he reports they are uncomfortable. Educated on their importance.   Patient is stable for discharge from plastic surgery stance, await ID recommendations for PO vs IV antibiotics as outpatient. When d/c'ed, continue  with local wound care and follow up in office.    LOS: 3 days    Charlies Constable, PA-C 06/03/2019

## 2019-06-03 NOTE — Telephone Encounter (Signed)
Thank you- will discuss issues at f/u appointment today
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
## 2019-06-03 NOTE — Progress Notes (Signed)
Infectious Diseases Pharmacy Note Patient Antimicrobial History    Christopher [name] has had an extensive antimicrobial history in 2020 which is detailed below.   Original date of injury: 07/10/18.   Ceftriaxone and Metronidazole 1/29- 07/16/2018  Zosyn 2/4-2/14/2020  Tracheal aspirate culture 08/01/18- Acinetobacter S-Unasyn Unasyn 2/19-2/26/2020  Zosyn 3/2-3/5 Ceftiaxone 3/8-3/10 Cefepime X 2 doses 3/11   Blood cultures 3/11 Serratia marcescens R-Ancef only Pseudomonas putida S-carbapenems, High MICs for cefepime, ceftaz, fluroquinolones and zosyn  Meropenem 3/11-3/25    Vanc/Cefepime 4/14-4/15  Urine Culture 4/13- Pan-S Enterococcus Faecalis  Ampicillin 4/16-4/24  Sacral Wound Culture 5/7 Pseudomonas aeruginosa - R carbapenems  E Coli- R-Amp, Unasyn, quinolones and Bactrim   Cipro 5/4-5/18 Cefepime 5/18- 5/25   Urine Culture 6/7- E Faecalis Cefepime and Macrobid 6/7-6/14   Outpatient Doxy 7/15-7/20 Outpatient Augmentin 8/3-8/10 Outpatient Keflex 9/7-9/14  Cefepime and Metronidazole 9/17-11/1  Outpatient Doxy 11/8-11/22 and 12/12-present  Outpatient Keflex 11/26-11/25 and 12/2 -12/7    11/13 Wound Culture- E Coli   12/18 Wound culture- Gram negative rods Ceftriaxone and Doxy 12/17- present  Fluconazole 12/19-present     Jimmy Footman, PharmD, BCPS, BCIDP Infectious Diseases Clinical Pharmacist Phone: [phone] 06/03/2019 9:15 AM

## 2019-06-03 NOTE — Progress Notes (Signed)
Fentanyl patch was taken out by mistake.  New patch was applied to replace it. Pt was made aware of.  MD notified via secure chat.  Will continue to monitor.

## 2019-06-04 ENCOUNTER — Inpatient Hospital Stay: Payer: Self-pay

## 2019-06-04 ENCOUNTER — Ambulatory Visit: Payer: BC Managed Care – PPO | Admitting: Plastic Surgery

## 2019-06-04 DIAGNOSIS — M869 Osteomyelitis, unspecified: Secondary | ICD-10-CM

## 2019-06-04 DIAGNOSIS — B3749 Other urogenital candidiasis: Secondary | ICD-10-CM

## 2019-06-04 MED ORDER — SODIUM CHLORIDE 0.9 % IV SOLN
2.0000 g | Freq: Three times a day (TID) | INTRAVENOUS | Status: DC
  Administered 2019-06-04 – 2019-06-05 (×3): 2 g via INTRAVENOUS
  Filled 2019-06-04 (×5): qty 2

## 2019-06-04 MED ORDER — METRONIDAZOLE 500 MG PO TABS
500.0000 mg | ORAL_TABLET | Freq: Three times a day (TID) | ORAL | Status: DC
  Administered 2019-06-04 – 2019-06-05 (×3): 500 mg via ORAL
  Filled 2019-06-04 (×3): qty 1

## 2019-06-04 MED ORDER — FLUCONAZOLE 100 MG PO TABS
100.0000 mg | ORAL_TABLET | Freq: Every day | ORAL | Status: DC
  Administered 2019-06-04 – 2019-06-05 (×2): 100 mg via ORAL
  Filled 2019-06-04: qty 1

## 2019-06-04 MED ORDER — CEFEPIME IV (FOR PTA / DISCHARGE USE ONLY): 2.0000 g | Freq: Three times a day (TID) | INTRAVENOUS | 0 refills | Status: DC

## 2019-06-04 MED ORDER — SODIUM CHLORIDE 0.9% FLUSH: 10.0000 mL | INTRAVENOUS | Status: DC | PRN

## 2019-06-04 MED ORDER — CHLORHEXIDINE GLUCONATE CLOTH 2 % EX PADS
6.0000 | MEDICATED_PAD | Freq: Every day | CUTANEOUS | Status: DC
  Administered 2019-06-05 (×2): 6 via TOPICAL
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
## 2019-06-04 NOTE — Progress Notes (Signed)
PHARMACY CONSULT NOTE FOR:  OUTPATIENT  PARENTERAL ANTIBIOTIC THERAPY (OPAT)  Indication: Osteomyelitis Regimen: Cefepime 2 gm every 8 hours + Metronidazole PO 500 mg Q 8 hours  End date: 07/15/2019  IV antibiotic discharge orders are pended. To discharging provider:  please sign these orders via discharge navigator,  Select New Orders & click on the button choice - Manage This Unsigned Work.     Thank you for allowing pharmacy to be a part of this patient's care.  Jimmy Footman, PharmD, BCPS, [facility] Clinical Pharmacist Phone: [phone] 06/04/2019, 1:07 PM

## 2019-06-04 NOTE — Progress Notes (Signed)
      INFECTIOUS DISEASE ATTENDING ADDENDUM:   Date: 06/04/2019  Patient name: Christopher [name]  Medical record number: [id number]  Date of birth: 29-May-1966   This patient has been seen and discussed with the house staff. Please see the resident's note for complete details. I have reviewed the pertinent HPI, Past medical, surgical, family, social history, ROS, allergies, medications, pertinent laboratory and radiographic data and examined the patient independently.  I concur with their findings with the following additions/corrections:  Diagnosis:  Chronic osteomyelitis  Culture Result: Multiple anaerobes from bone culture in September Serratia and Pseudomonas from blood in stool March  No Known Allergies  OPAT Orders Discharge antibiotics:  Cefepime IV plus oral metronidazole 500 mg 3 times daily  Duration:  6 weeks End Date:  July 15, 2019  [facility] Care Per Protocol:    Labs  weekly while on IV antibiotics: _x_ CBC with differential x__ BMP w GFR/CMP x__ CRP _x_ ESR    _x_ Please pull PIC at completion of IV antibiotics __ Please leave PIC in place until doctor has seen patient or been notified  Fax weekly labs to [phone]   Christopher [name] has an appointment on Mary 19th, 2021 with Dr. Tommy [name] at [facility] is located in the [facility] at  [place] in [place].  [address], which is located to the left of the elevators.  Phone: [phone]  Fax: [phone]  [email or web address]  He should arrive 15 minutes prior to his appointment.   Christopher [name] 06/04/2019, 12:51 PM

## 2019-06-04 NOTE — Progress Notes (Addendum)
Subjective: No new complaints   Antibiotics:  Anti-infectives (From admission, onward)   Start     Dose/Rate Route Frequency Ordered Stop   06/04/19 1600  ceFEPIme (MAXIPIME) 2 g in sodium chloride 0.9 % 100 mL IVPB     2 g 200 mL/hr over 30 Minutes Intravenous Every 8 hours 06/04/19 1003     06/04/19 1400  metroNIDAZOLE (FLAGYL) tablet 500 mg     500 mg Oral Every 8 hours 06/04/19 1003     06/03/19 0915  cefTRIAXone (ROCEPHIN) 2 g in sodium chloride 0.9 % 100 mL IVPB  Status:  Discontinued     2 g 200 mL/hr over 30 Minutes Intravenous Every 24 hours 06/03/19 0825 06/04/19 1003   06/02/19 1000  fluconazole (DIFLUCAN) tablet 100 mg  Status:  Discontinued     100 mg Oral Daily 06/01/19 1040 06/04/19 0857   06/01/19 1000  fluconazole (DIFLUCAN) IVPB 100 mg     100 mg 50 mL/hr over 60 Minutes Intravenous Every 24 hours 06/01/19 0903 06/01/19 1221   06/01/19 0915  cefTRIAXone (ROCEPHIN) 1 g in sodium chloride 0.9 % 100 mL IVPB  Status:  Discontinued     1 g 200 mL/hr over 30 Minutes Intravenous Every 24 hours 06/01/19 0905 06/03/19 0825   05/31/19 1000  cefTRIAXone (ROCEPHIN) 1 g in sodium chloride 0.9 % 100 mL IVPB  Status:  Discontinued     1 g 200 mL/hr over 30 Minutes Intravenous Every 24 hours 05/30/19 1644 06/01/19 0903   05/30/19 2200  doxycycline (VIBRA-TABS) tablet 100 mg  Status:  Discontinued     100 mg Oral 2 times daily 05/30/19 1938 06/04/19 1003   05/30/19 1430  cefTRIAXone (ROCEPHIN) 1 g in sodium chloride 0.9 % 100 mL IVPB     1 g 200 mL/hr over 30 Minutes Intravenous  Once 05/30/19 1419 05/30/19 1517      Medications: Scheduled Meds: . citalopram  20 mg Oral Daily  . dronabinol  2.5 mg Oral BID AC  . feeding supplement (PRO-STAT SUGAR FREE 64)  30 mL Oral TID WC  . fentaNYL  1 patch Transdermal Q48H  . gabapentin  300 mg Oral TID  . guaiFENesin  1,200 mg Oral BID  . lidocaine  3 patch Transdermal Q24H  . loratadine  10 mg Oral Daily  . Melatonin   3 mg Oral QHS  . metroNIDAZOLE  500 mg Oral Q8H  . multivitamin with minerals  1 tablet Oral Daily  . nutrition supplement (JUVEN)  1 packet Oral BID BM  . oxyCODONE-acetaminophen  1 tablet Oral Q4H while awake  . pantoprazole  40 mg Oral BID  . polyethylene glycol  17 g Oral Daily  . QUEtiapine  25 mg Oral QHS  . senna-docusate  2 tablet Oral QHS  . sodium chloride flush  5 mL Intracatheter Q8H  . sodium chloride flush  5 mL Intracatheter Q8H  . tamsulosin  0.4 mg Oral QHS   Continuous Infusions: . sodium chloride 20 mL/hr at 06/04/19 0300  . ceFEPime (MAXIPIME) IV     PRN Meds:.acetaminophen, albuterol, clonazepam, HYDROmorphone (DILAUDID) injection, magnesium hydroxide, methocarbamol, promethazine    Objective: Weight change:   Intake/Output Summary (Last 24 hours) at 06/04/2019 1243 Last data filed at 06/04/2019 0456 Gross per 24 hour  Intake 800 ml  Output 2075 ml  Net -1275 ml   Blood pressure (!) 155/92, pulse 73, temperature 97.9 F (36.6 C), temperature source  Oral, resp. rate 18, height '6\' 3"'  (1.905 m), weight 59 kg, SpO2 97 %. Temp:  [97.9 F (36.6 C)-98.5 F (36.9 C)] 97.9 F (36.6 C) (12/22 0450) Pulse Rate:  [66-73] 73 (12/22 0450) Resp:  [14-18] 18 (12/22 0450) BP: (116-155)/(78-92) 155/92 (12/22 0450) SpO2:  [97 %-100 %] 97 % (12/22 0450)  Physical Exam: General: Alert and awake, oriented x3, not in any acute distress.cachectic HEENT: anicteric sclera, EOMI CVS regular rate, normal  Chest: , no wheezing, no respiratory distress Abdomen: soft non-distended,  Skin: wound dressed Neuro: nonfocal  CBC:    BMET Recent Labs    06/02/19 0257 06/03/19 0838  NA 141 140  K 2.8* 3.6  CL 110 109  CO2 23 23  GLUCOSE 91 158*  BUN 10 8  CREATININE 0.39* 0.58*  CALCIUM 9.3 9.5     Liver Panel  No results for input(s): PROT, ALBUMIN, AST, ALT, ALKPHOS, BILITOT, BILIDIR, IBILI in the last 72 hours.     Sedimentation Rate Recent Labs     06/03/19 1332  ESRSEDRATE 40*   C-Reactive Protein Recent Labs    06/03/19 1332  CRP 5.1*    Micro Results: Recent Results (from the past 720 hour(s))  Urine culture     Status: Abnormal   Collection Time: 05/30/19 12:30 PM   Specimen: Urine, Random  Result Value Ref Range Status   Specimen Description URINE, RANDOM  Final   Special Requests   Final    NONE Performed at [facility], [place] [address] [place], [place] [address]    Culture >=100,000 COLONIES/mL YEAST (A)  Final   Report Status 05/31/2019 FINAL  Final  Respiratory Panel by RT PCR (Flu A&B, Covid) - Nasopharyngeal Swab     Status: None   Collection Time: 05/30/19  6:07 PM   Specimen: Nasopharyngeal Swab  Result Value Ref Range Status   SARS Coronavirus 2 by RT PCR NEGATIVE NEGATIVE Final    Comment: (NOTE) SARS-CoV-2 target nucleic acids are NOT DETECTED. The SARS-CoV-2 RNA is generally detectable in upper respiratoy specimens during the acute phase of infection. The lowest concentration of SARS-CoV-2 viral copies this assay can detect is 131 copies/mL. A negative result does not preclude SARS-Cov-2 infection and should not be used as the sole basis for treatment or other patient management decisions. A negative result may occur with  improper specimen collection/handling, submission of specimen other than nasopharyngeal swab, presence of viral mutation(s) within the areas targeted by this assay, and inadequate number of viral copies (<131 copies/mL). A negative result must be combined with clinical observations, patient history, and epidemiological information. The expected result is Negative. Fact Sheet for Patients:  PinkCheek.be Fact Sheet for Healthcare Providers:  GravelBags.it This test is not yet ap proved or cleared by the [place] FDA and  has been authorized for detection and/or diagnosis of SARS-CoV-2 by FDA under an Emergency  Use Authorization (EUA). This EUA will remain  in effect (meaning this test can be used) for the duration of the COVID-19 declaration under Section 564(b)(1) of the Act, 21 U.S.C. section 360bbb-3(b)(1), unless the authorization is terminated or revoked sooner.    Influenza A by PCR NEGATIVE NEGATIVE Final   Influenza B by PCR NEGATIVE NEGATIVE Final    Comment: (NOTE) The Xpert Xpress SARS-CoV-2/FLU/RSV assay is intended as an aid in  the diagnosis of influenza from Nasopharyngeal swab specimens and  should not be used as a sole basis for treatment. Nasal washings and  aspirates are unacceptable for Xpert Xpress SARS-CoV-2/FLU/RSV  testing. Fact Sheet for Patients: PinkCheek.be Fact Sheet for Healthcare Providers: GravelBags.it This test is not yet approved or cleared by the [place] FDA and  has been authorized for detection and/or diagnosis of SARS-CoV-2 by  FDA under an Emergency Use Authorization (EUA). This EUA will remain  in effect (meaning this test can be used) for the duration of the  Covid-19 declaration under Section 564(b)(1) of the Act, 21  U.S.C. section 360bbb-3(b)(1), unless the authorization is  terminated or revoked. Performed at [facility], [place] [address], [place], [place] [address]   Aerobic Culture (superficial specimen)     Status: None   Collection Time: 05/31/19 12:19 PM   Specimen: Wound  Result Value Ref Range Status   Specimen Description WOUND LEFT HIP  Final   Special Requests NONE  Final   Gram Stain   Final    MODERATE WBC PRESENT, PREDOMINANTLY PMN RARE GRAM NEGATIVE RODS    Culture   Final    FEW ESCHERICHIA COLI Two isolates with different morphologies were identified as the same organism.The most resistant organism was reported. Performed at [facility], [place] [address], [place], [place] [address]    Report Status 06/03/2019 FINAL  Final   Organism ID, Bacteria  ESCHERICHIA COLI  Final      Susceptibility   Escherichia coli - MIC*    AMPICILLIN >=32 RESISTANT Resistant     CEFAZOLIN <=4 SENSITIVE Sensitive     CEFEPIME <=1 SENSITIVE Sensitive     CEFTAZIDIME <=1 SENSITIVE Sensitive     CEFTRIAXONE <=1 SENSITIVE Sensitive     CIPROFLOXACIN >=4 RESISTANT Resistant     GENTAMICIN >=16 RESISTANT Resistant     IMIPENEM <=0.25 SENSITIVE Sensitive     TRIMETH/SULFA >=320 RESISTANT Resistant     AMPICILLIN/SULBACTAM >=32 RESISTANT Resistant     PIP/TAZO <=4 SENSITIVE Sensitive     * FEW ESCHERICHIA COLI    Studies/Results: No results found.    Assessment/Plan:  INTERVAL HISTORY: Patient seen by Christopher [name]   Principal Problem:   Wound infection, posttraumatic Active Problems:   Chronic pain syndrome   Multiple traumatic injuries   Anxiety and depression   Hydronephrosis concurrent with and due to calculi of kidney and ureter   Chronic osteomyelitis of pelvis, left (HCC)   Kidney stone   Acute lower UTI   Protein-calorie malnutrition, severe    Christopher [name] is a 53 y.o. male with osteomyelitis of pelvis that sp excision of bone in September by Christopher [name]. He had grown anerobes from deep culture at that time. He has previously grown Serratia and Pseudomonas from blood cultures. There is a "deep surgical culture" that Christopher [name] took (though I am not entirely sure of how deep a culture this was that grew an E coli. He was admitted for kidney stone and underwent nephrostomy with relief. He grew Candida in urine. He was incidentally found to have chronic osteo  On imaging. In talking to the patient and his wife today he HAS had increased pain in hip and drainage from sinus tract AFTER IV antibiotics were stopped. 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  I also think the patient clearly needs to be followed regularly by [place] with infectious diseases so that we can be involved in starting and stopping antibiotics that are targeting the organisms we have from deep cultures.  I will put an order for PICC line and for IV cefepime and oral metronidazole which he tolerated well before.   CANDIDURIA: NOTE WITH SUPRAPUBIC CATHETER THIS NEEDS TO BE REMOVED TO ERADICATE THE FUNGAL INFECTION/COLONIZATION  ONCE THIS HAS BEEN DONE FLUCONAZOLE CAN BE DC  PLEASE CONSULT UROLOGY FOR EXCHANGE OF CATHETER  I spent greater than 35 minutes with the patient including greater than 50% of time in face to face counsel of the patient and his wife who is on the telephone regarding the nature of his infection and decision to go forward with IV antibiotics followed by close follow-up in our clinic.      LOS: 4 days   Christopher [name] 06/04/2019, 12:43 PM

## 2019-06-04 NOTE — Procedures (Signed)
Suprapubic catheter was exchanged in the routine sterile fashion with a 15F latex catheter.  Patient tolerated the procedure well.  I recommend that this be done monthly.

## 2019-06-04 NOTE — TOC Progression Note (Addendum)
Transition of Care [facility]) - Progression Note    Patient Details  Name: Christopher [name] MRN: [id number] Date of Birth: [birth year]  Transition of [facility]) CM/SW Contact  Jacalyn Lefevre Edson Snowball, RN Phone Number: 06/04/2019, 2:33 PM  Clinical Narrative:    Plan for patient to receive PICC line today and discharge home tomorrow. Spoke with patient. Patient will need transportation home  patient's workers comp Tourist information centre manager is [employer] [phone] (  faxed to her at [phone])  left her a message.  Awaiting call back. Ms Forbes Cellar aware of discharge orders, requesting home health aide be added to home health orders and patient be sent home by [facility] tomorrow. Updated home health orders, messaged MD to sign. Will fax once signed.  Left voicemail for [facility] with [facility]. Awaiting call back.   Spoke with [name] with [facility].   Expected Discharge Plan: [place]  Expected Discharge Plan: [place] Barriers to Discharge: Continued Medical Work up  Expected Discharge Plan and Services Expected Discharge Plan: [place]   Discharge Planning Services: CM Consult Post Acute Care Choice: [place] arrangements for the past 2 months: Single Family Home                 DME Arranged: N/A                     Social Determinants of Health (SDOH) Interventions    Readmission Risk Interventions Readmission Risk Prevention Plan 04/06/2019 10/16/2018  Transportation Screening - Complete  PCP or Specialist Appt within 3-5 Days Complete -  HRI or Home Care Consult Complete -  Social Work Consult for [place] Planning/Counseling Complete -  Palliative Care Screening Not Applicable -  Medication Review Press photographer) Complete Complete  PCP or Specialist appointment within 3-5 days of discharge - Not Complete  PCP/Specialist Appt Not Complete comments - Pt discharging to [place] or [place] - Not Complete  HRI or Home Care Consult Pt Refusal Comments - Pt discharging to [place] - Not Complete  SW Consult Not Complete Comments - Pt discharging to [place] - Not Applicable  Some recent data might be hidden

## 2019-06-04 NOTE — Progress Notes (Signed)
Subjective: Resting in bed today, complains of some back pain due to laying on nephrostomy tube. Difficulty getting comfortable with L hip pain and right flank pain/tube.  No fevers, chills, n/v.   Scheduled for placement of PICC line for outpatient IV antibiotics.   Objective: Vital signs in last 24 hours: Temp:  [97.8 F (36.6 C)-98 F (36.7 C)] 97.8 F (36.6 C) (12/22 1304) Pulse Rate:  [64-73] 64 (12/22 1304) Resp:  [14-18] 18 (12/22 1304) BP: (116-155)/(78-92) 129/80 (12/22 1304) SpO2:  [97 %-100 %] 100 % (12/22 1304) Last BM Date: 06/03/19  Intake/Output from previous day: 12/21 0701 - 12/22 0700 In: 800 [P.O.:300; I.V.:400; IV Piggyback:100] Out: 2375 [Urine:2075; Stool:300] Intake/Output this shift: Total I/O In: 606.3 [I.V.:584.7; IV Piggyback:21.6] Out: -   General appearance: alert, cooperative, no distress and resting in bed Head: Normocephalic, without obvious abnormality, atraumatic Resp: unlabored, symmetric rise and fall Skin: multiple skin graft sites noted (donor and recipient), new 0.5 x 1 cm blister superior to L hip wound. Mepilex border dressing in place over L hip and sacral area - silver alginate in place over hip wound, purulent drainage noted on 4x4. Peri-wound area is non-erythematous, no foul smell noted, TTP superior to L hip wound.  Lab Results:  CBC Latest Ref Rng & Units 06/03/2019 06/02/2019 05/31/2019  WBC 4.0 - 10.5 K/uL 4.3 4.3 6.6  Hemoglobin 13.0 - 17.0 g/dL 11.9(L) 11.1(L) 12.0(L)  Hematocrit 39.0 - 52.0 % 36.6(L) 33.9(L) 38.2(L)  Platelets 150 - 400 K/uL 270 209 243    BMET Recent Labs    06/02/19 0257 06/03/19 0838  NA 141 140  K 2.8* 3.6  CL 110 109  CO2 23 23  GLUCOSE 91 158*  BUN 10 8  CREATININE 0.39* 0.58*  CALCIUM 9.3 9.5   PT/INR No results for input(s): LABPROT, INR in the last 72 hours. ABG No results for input(s): PHART, HCO3 in the last 72 hours.  Invalid input(s): PCO2, PO2  Studies/Results: [place] EKG  SITE RITE  Result Date: 06/04/2019 If Site Rite image not attached, placement could not be confirmed due to current cardiac rhythm.   Anti-infectives: Anti-infectives (From admission, onward)   Start     Dose/Rate Route Frequency Ordered Stop   06/04/19 1600  ceFEPIme (MAXIPIME) 2 g in sodium chloride 0.9 % 100 mL IVPB     2 g 200 mL/hr over 30 Minutes Intravenous Every 8 hours 06/04/19 1003     06/04/19 1400  metroNIDAZOLE (FLAGYL) tablet 500 mg     500 mg Oral Every 8 hours 06/04/19 1003     06/04/19 1315  fluconazole (DIFLUCAN) tablet 100 mg     100 mg Oral Daily 06/04/19 1310     06/04/19 0000  ceFEPime (MAXIPIME) IVPB     2 g Intravenous Every 8 hours 06/04/19 1425 07/16/19 2359   06/03/19 0915  cefTRIAXone (ROCEPHIN) 2 g in sodium chloride 0.9 % 100 mL IVPB  Status:  Discontinued     2 g 200 mL/hr over 30 Minutes Intravenous Every 24 hours 06/03/19 0825 06/04/19 1003   06/02/19 1000  fluconazole (DIFLUCAN) tablet 100 mg  Status:  Discontinued     100 mg Oral Daily 06/01/19 1040 06/04/19 0857   06/01/19 1000  fluconazole (DIFLUCAN) IVPB 100 mg     100 mg 50 mL/hr over 60 Minutes Intravenous Every 24 hours 06/01/19 0903 06/01/19 1221   06/01/19 0915  cefTRIAXone (ROCEPHIN) 1 g in sodium chloride 0.9 % 100 mL IVPB  Status:  Discontinued     1 g 200 mL/hr over 30 Minutes Intravenous Every 24 hours 06/01/19 0905 06/03/19 0825   05/31/19 1000  cefTRIAXone (ROCEPHIN) 1 g in sodium chloride 0.9 % 100 mL IVPB  Status:  Discontinued     1 g 200 mL/hr over 30 Minutes Intravenous Every 24 hours 05/30/19 1644 06/01/19 0903   05/30/19 2200  doxycycline (VIBRA-TABS) tablet 100 mg  Status:  Discontinued     100 mg Oral 2 times daily 05/30/19 1938 06/04/19 1003   05/30/19 1430  cefTRIAXone (ROCEPHIN) 1 g in sodium chloride 0.9 % 100 mL IVPB     1 g 200 mL/hr over 30 Minutes Intravenous  Once 05/30/19 1419 05/30/19 1517      Assessment/Plan:  Continue with local wound care for L hip  wound and groin wound. Silver alginate to both wounds, 4x4 gauze, medipore tape/mepilex border dressing for holding gauze in place. Dressing changed today during evaluation.  Patient scheduled to have PICC placed today or tomorrow.  Follow with ID for further abx recommendations.  Will follow up with patient as outpatient for wound care management.    LOS: 4 days    Charlies Constable, PA-C 06/04/2019

## 2019-06-04 NOTE — Progress Notes (Signed)
Peripherally Inserted Central Catheter/Midline Placement  The IV Nurse has discussed with the patient and/or persons authorized to consent for the patient, the purpose of this procedure and the potential benefits and risks involved with this procedure.  The benefits include less needle sticks, lab draws from the catheter, and the patient may be discharged home with the catheter. Risks include, but not limited to, infection, bleeding, blood clot (thrombus formation), and puncture of an artery; nerve damage and irregular heartbeat and possibility to perform a PICC exchange if needed/ordered by physician.  Alternatives to this procedure were also discussed.  Bard Power PICC patient education guide, fact sheet on infection prevention and patient information card has been provided to patient /or left at bedside.    PICC/Midline Placement Documentation  PICC Single Lumen 06/04/19 PICC Right Brachial 40 cm 0 cm (Active)  Indication for Insertion or Continuance of Line Home intravenous therapies (PICC only) 06/04/19 1800  Exposed Catheter (cm) 0 cm 06/04/19 1800  Site Assessment Clean;Dry;Intact 06/04/19 1800  Line Status Flushed;Saline locked;Blood return noted 06/04/19 1800  Dressing Type Transparent;Securing device 06/04/19 1800  Dressing Status Clean;Dry;Intact;Antimicrobial disc in place 06/04/19 1800  Dressing Intervention New dressing 06/04/19 1800  Dressing Change Due 06/11/19 06/04/19 1800       Enos Fling 06/04/2019, 6:19 PM

## 2019-06-05 DIAGNOSIS — X58XXXD Exposure to other specified factors, subsequent encounter: Secondary | ICD-10-CM

## 2019-06-05 DIAGNOSIS — R112 Nausea with vomiting, unspecified: Secondary | ICD-10-CM

## 2019-06-05 DIAGNOSIS — S329XXK Fracture of unspecified parts of lumbosacral spine and pelvis, subsequent encounter for fracture with nonunion: Secondary | ICD-10-CM

## 2019-06-05 LAB — CBC WITH DIFFERENTIAL/PLATELET
Abs Immature Granulocytes: 0.01 10*3/uL (ref 0.00–0.07)
Basophils Absolute: 0 10*3/uL (ref 0.0–0.1)
Basophils Relative: 1 %
Eosinophils Absolute: 0.4 10*3/uL (ref 0.0–0.5)
Eosinophils Relative: 10 %
HCT: 33.5 % — ABNORMAL LOW (ref 39.0–52.0)
Hemoglobin: 10.8 g/dL — ABNORMAL LOW (ref 13.0–17.0)
Immature Granulocytes: 0 %
Lymphocytes Relative: 30 %
Lymphs Abs: 1.2 10*3/uL (ref 0.7–4.0)
MCH: 32.3 pg (ref 26.0–34.0)
MCHC: 32.2 g/dL (ref 30.0–36.0)
MCV: 100.3 fL — ABNORMAL HIGH (ref 80.0–100.0)
Monocytes Absolute: 0.3 10*3/uL (ref 0.1–1.0)
Monocytes Relative: 8 %
Neutro Abs: 2.1 10*3/uL (ref 1.7–7.7)
Neutrophils Relative %: 51 %
Platelets: 243 10*3/uL (ref 150–400)
RBC: 3.34 MIL/uL — ABNORMAL LOW (ref 4.22–5.81)
RDW: 12.3 % (ref 11.5–15.5)
WBC: 4.1 10*3/uL (ref 4.0–10.5)
nRBC: 0 % (ref 0.0–0.2)

## 2019-06-05 LAB — BASIC METABOLIC PANEL
Anion gap: 7 (ref 5–15)
BUN: 10 mg/dL (ref 6–20)
CO2: 26 mmol/L (ref 22–32)
Calcium: 9.1 mg/dL (ref 8.9–10.3)
Chloride: 106 mmol/L (ref 98–111)
Creatinine, Ser: 0.52 mg/dL — ABNORMAL LOW (ref 0.61–1.24)
GFR calc Af Amer: >60 mL/min (ref >60)
GFR calc non Af Amer: >60 mL/min (ref >60)
Glucose, Bld: 90 mg/dL (ref 70–99)
Potassium: 3.2 mmol/L — ABNORMAL LOW (ref 3.5–5.1)
Sodium: 139 mmol/L (ref 135–145)

## 2019-06-05 MED ORDER — HEPARIN SOD (PORK) LOCK FLUSH 100 UNIT/ML IV SOLN
250.0000 [IU] | INTRAVENOUS | Status: AC | PRN
  Administered 2019-06-05: 250 [IU]
  Filled 2019-06-05: qty 2.5

## 2019-06-05 MED ORDER — QUETIAPINE FUMARATE 50 MG PO TABS: 50.0000 mg | ORAL_TABLET | Freq: Every day | ORAL | 5 refills | Status: DC

## 2019-06-05 MED ORDER — POTASSIUM CHLORIDE CRYS ER 20 MEQ PO TBCR
40.0000 meq | EXTENDED_RELEASE_TABLET | ORAL | Status: AC
  Administered 2019-06-05 (×2): 40 meq via ORAL
  Filled 2019-06-05 (×2): qty 2

## 2019-06-05 MED ORDER — METRONIDAZOLE 500 MG PO TABS: 500.0000 mg | ORAL_TABLET | Freq: Three times a day (TID) | ORAL | 0 refills | Status: DC

## 2019-06-05 MED ORDER — TAMSULOSIN HCL 0.4 MG PO CAPS: 0.4000 mg | ORAL_CAPSULE | Freq: Every day | ORAL | 0 refills | Status: AC

## 2019-06-05 MED ORDER — MEROPENEM IV (FOR PTA / DISCHARGE USE ONLY): 1.0000 g | Freq: Three times a day (TID) | INTRAVENOUS | 0 refills | Status: AC

## 2019-06-05 MED ORDER — SODIUM CHLORIDE 0.9 % IV SOLN
1.0000 g | Freq: Three times a day (TID) | INTRAVENOUS | Status: DC
  Administered 2019-06-05: 1 g via INTRAVENOUS
  Filled 2019-06-05 (×4): qty 1

## 2019-06-05 NOTE — Plan of Care (Signed)

## 2019-06-05 NOTE — TOC Progression Note (Addendum)
Transition of Care [facility]) - Progression Note    Patient Details  Name: Christopher [name] MRN: [id number] Date of Birth: 28-Feb-1966  Transition [facility] Care [facility]) CM/SW Contact  Jacalyn Lefevre Edson Snowball, RN Phone Number: 06/05/2019, 11:33 AM  Clinical Narrative:     Received authorization for IV ABX at home. [name] with [facility] will do a zoom teaching with patient's wife at 1600. Once first dose of [place] infused patient can be discharged. Bedside nurse aware. PTAR paperwork in chart.   Antibiotic has changed to Merrem 1 gram IV every 8 hours through 2/1.   Have notified [facility], Mateo Flow with [place] and left message with workers comp Tourist information centre manager for authorization. Patient will need to have first dose here in hospital.  Jenny Reichmann with workers comp updated and clinicals faxed to her for authorization of merrem.  Spoke to patient.   Confirmed address. PTAR paperwork completed and placed in chart for when patient ready.  Expected Discharge Plan: [place] Barriers to Discharge: Continued Medical Work up  Expected Discharge Plan and Services Expected Discharge Plan: [place]   Discharge Planning Services: CM Consult Post Acute Care Choice: [place] arrangements for the past 2 months: Single Family Home Expected Discharge Date: 06/05/19               DME Arranged: N/A                     Social Determinants of Health (SDOH) Interventions    Readmission Risk Interventions Readmission Risk Prevention Plan 04/06/2019 10/16/2018  Transportation Screening - Complete  PCP or Specialist Appt within 3-5 Days Complete -  HRI or Home Care Consult Complete -  Social Work Consult for [place] Planning/Counseling Complete -  Palliative Care Screening Not Applicable -  Medication Review Press photographer) Complete Complete  PCP or Specialist appointment within 3-5 days of discharge - Not Complete  PCP/Specialist Appt  Not Complete comments - Pt discharging to [place] or [place] - Not Complete  HRI or Home Care Consult Pt Refusal Comments - Pt discharging to [place] - Not Complete  SW Consult Not Complete Comments - Pt discharging to [place] - Not Applicable  Some recent data might be hidden

## 2019-06-05 NOTE — Discharge Instructions (Signed)
Osteomyelitis, Adult  Bone infections (osteomyelitis) occur when bacteria or other germs get inside a bone. This can happen if you have an infection in another part of your body that spreads through your blood. Germs from your skin or from outside of your body can also cause this type of infection if you have a wound or a broken bone (fracture) that breaks the skin. Bone infections need to be treated quickly to prevent bone damage and to prevent the infection from spreading to other areas of your body. What are the causes? Most bone infections are caused by bacteria. They can also be caused by other germs, such as viruses and funguses. What increases the risk? You are more likely to develop this condition if you:  Recently had surgery, especially bone or joint surgery.  Have a long-term (chronic) disease, such as: ? Diabetes. ? HIV (human immunodeficiency virus). ? Rheumatoid arthritis. ? Sickle cell anemia. ? Kidney disease that requires dialysis.  Are aged 60 years or older.  Have a condition or take medicines that block or weaken your body's defense system (immune system).  Have a condition that reduces your blood flow.  Have an artificial joint.  Have had a joint or bone repaired with plates or screws (surgical hardware).  Use IV drugs.  Have a central line for IV access.  Have had trauma, such as stepping on a nail or a broken bone that came through the skin. What are the signs or symptoms? Symptoms vary depending on the type and location of your infection. Common symptoms of bone infections include:  Fever and chills.  Skin redness and warmth.  Swelling.  Pain and stiffness.  Drainage of fluid or pus near the infection. How is this diagnosed? This condition may be diagnosed based on:  Your symptoms and medical history.  A physical exam.  Tests, such as: ? A sample of tissue, fluid, or blood taken to be examined under a microscope. ? Pus or discharge swabbed  from a wound for testing to identify germs and to determine what type of medicine will kill them (culture and sensitivity). ? Blood tests.  Imaging studies. These may include: ? X-rays. ? MRI. ? CT scan. ? Bone scan. ? Ultrasound. How is this treated? Treatment for this condition depends on the cause and type of infection. Antibiotic medicines are usually the first treatment for a bone infection. This may be done in a hospital at first. You may have to continue IV antibiotics at home or take antibiotics by mouth for several weeks after that. Other treatments may include surgery to remove:  Dead or dying tissue from a bone.  An infected artificial joint.  Infected plates or screws that were used to repair a broken bone. Follow these instructions at home: Medicines   Take over-the-counter and prescription medicines only as told by your health care provider.  Take your antibiotic medicine as told by your health care provider. Do not stop taking the antibiotic even if you start to feel better.  Follow instructions from your health care provider about how to take IV antibiotics at home. You may need to have a nurse come to your home to give you the IV antibiotics. General instructions   Ask your health care provider if you have any restrictions on your activities.  If directed, put ice on the affected area: ? Put ice in a plastic bag. ? Place a towel between your skin and the bag. ? Leave the ice on for 20   minutes, 2-3 times a day.  Wash your hands often with soap and water. If soap and water are not available, use hand sanitizer.  Do not use any products that contain nicotine or tobacco, such as cigarettes and e-cigarettes. These can delay bone healing. If you need help quitting, ask your health care provider.  Keep all follow-up visits as told by your health care provider. This is important. Contact a health care provider if:  You develop a fever or chills.  You have  redness, warmth, pain, or swelling that returns after treatment. Get help right away if:  You have rapid breathing or you have trouble breathing.  You have chest pain.  You cannot drink fluids or make urine.  The affected area swells, changes color, or turns blue.  You have numbness or severe pain in the affected area. Summary  Bone infections (osteomyelitis) occur when bacteria or other germs get inside a bone.  You may be more likely to get this type of infection if you have a condition, such as diabetes, that lowers your ability to fight infection or increases your chances of getting an infection.  Most bone infections are caused by bacteria. They can also be caused by other germs, such as viruses and funguses.  Treatment for this condition usually starts with taking antibiotics. Further treatment depends on the cause and type of infection. This information is not intended to replace advice given to you by your health care provider. Make sure you discuss any questions you have with your health care provider. Document Released: 05/30/2005 Document Revised: 06/15/2017 Document Reviewed: 06/08/2017 Elsevier Patient Education  2020 [employer]  

## 2019-06-05 NOTE — Progress Notes (Signed)
Subjective:  Complains of nausea vomiting since "starting the antibiotics.   Antibiotics:  Anti-infectives (From admission, onward)   Start     Dose/Rate Route Frequency Ordered Stop   06/05/19 1400  meropenem (MERREM) 1 g in sodium chloride 0.9 % 100 mL IVPB     1 g 200 mL/hr over 30 Minutes Intravenous Every 8 hours 06/05/19 1032     06/05/19 0000  metroNIDAZOLE (FLAGYL) 500 MG tablet  Status:  Discontinued     500 mg Oral Every 8 hours 06/05/19 0834 06/05/19    06/05/19 0000  meropenem (MERREM) IVPB     1 g Intravenous Every 8 hours 06/05/19 1034 07/15/19 2359   06/04/19 1600  ceFEPIme (MAXIPIME) 2 g in sodium chloride 0.9 % 100 mL IVPB  Status:  Discontinued     2 g 200 mL/hr over 30 Minutes Intravenous Every 8 hours 06/04/19 1003 06/05/19 1032   06/04/19 1400  metroNIDAZOLE (FLAGYL) tablet 500 mg  Status:  Discontinued     500 mg Oral Every 8 hours 06/04/19 1003 06/05/19 1032   06/04/19 1315  fluconazole (DIFLUCAN) tablet 100 mg  Status:  Discontinued     100 mg Oral Daily 06/04/19 1310 06/05/19 0954   06/04/19 0000  ceFEPime (MAXIPIME) IVPB  Status:  Discontinued     2 g Intravenous Every 8 hours 06/04/19 1425 06/05/19    06/03/19 0915  cefTRIAXone (ROCEPHIN) 2 g in sodium chloride 0.9 % 100 mL IVPB  Status:  Discontinued     2 g 200 mL/hr over 30 Minutes Intravenous Every 24 hours 06/03/19 0825 06/04/19 1003   06/02/19 1000  fluconazole (DIFLUCAN) tablet 100 mg  Status:  Discontinued     100 mg Oral Daily 06/01/19 1040 06/04/19 0857   06/01/19 1000  fluconazole (DIFLUCAN) IVPB 100 mg     100 mg 50 mL/hr over 60 Minutes Intravenous Every 24 hours 06/01/19 0903 06/01/19 1221   06/01/19 0915  cefTRIAXone (ROCEPHIN) 1 g in sodium chloride 0.9 % 100 mL IVPB  Status:  Discontinued     1 g 200 mL/hr over 30 Minutes Intravenous Every 24 hours 06/01/19 0905 06/03/19 0825   05/31/19 1000  cefTRIAXone (ROCEPHIN) 1 g in sodium chloride 0.9 % 100 mL IVPB  Status:   Discontinued     1 g 200 mL/hr over 30 Minutes Intravenous Every 24 hours 05/30/19 1644 06/01/19 0903   05/30/19 2200  doxycycline (VIBRA-TABS) tablet 100 mg  Status:  Discontinued     100 mg Oral 2 times daily 05/30/19 1938 06/04/19 1003   05/30/19 1430  cefTRIAXone (ROCEPHIN) 1 g in sodium chloride 0.9 % 100 mL IVPB     1 g 200 mL/hr over 30 Minutes Intravenous  Once 05/30/19 1419 05/30/19 1517      Medications: Scheduled Meds: . Chlorhexidine Gluconate Cloth  6 each Topical Daily  . citalopram  20 mg Oral Daily  . dronabinol  2.5 mg Oral BID AC  . feeding supplement (PRO-STAT SUGAR FREE 64)  30 mL Oral TID WC  . fentaNYL  1 patch Transdermal Q48H  . gabapentin  300 mg Oral TID  . guaiFENesin  1,200 mg Oral BID  . lidocaine  3 patch Transdermal Q24H  . loratadine  10 mg Oral Daily  . Melatonin  3 mg Oral QHS  . multivitamin with minerals  1 tablet Oral Daily  . nutrition supplement (JUVEN)  1 packet Oral BID BM  .  oxyCODONE-acetaminophen  1 tablet Oral Q4H while awake  . pantoprazole  40 mg Oral BID  . polyethylene glycol  17 g Oral Daily  . QUEtiapine  25 mg Oral QHS  . senna-docusate  2 tablet Oral QHS  . sodium chloride flush  5 mL Intracatheter Q8H  . sodium chloride flush  5 mL Intracatheter Q8H  . tamsulosin  0.4 mg Oral QHS   Continuous Infusions: . sodium chloride 50 mL/hr at 06/05/19 0300  . meropenem (MERREM) IV 1 g (06/05/19 1421)   PRN Meds:.acetaminophen, albuterol, clonazepam, HYDROmorphone (DILAUDID) injection, magnesium hydroxide, methocarbamol, promethazine, sodium chloride flush    Objective: Weight change:   Intake/Output Summary (Last 24 hours) at 06/05/2019 1441 Last data filed at 06/05/2019 1236 Gross per 24 hour  Intake 1488.86 ml  Output 4100 ml  Net -2611.14 ml   Blood pressure (!) 148/97, pulse 82, temperature (!) 97.5 F (36.4 C), temperature source Oral, resp. rate 20, height '6\' 3"'  (1.905 m), weight 59 kg, SpO2 99 %. Temp:  [97.5 F  (36.4 C)-97.7 F (36.5 C)] 97.5 F (36.4 C) (12/23 0540) Pulse Rate:  [59-82] 82 (12/23 0540) Resp:  [16-20] 20 (12/23 0540) BP: (147-148)/(75-97) 148/97 (12/23 0540) SpO2:  [99 %] 99 % (12/23 0540)  Physical Exam: General: Alert and awake, oriented x3, not in any acute distress.cachectic HEENT: anicteric sclera, EOMI CVS regular rate, normal  Chest: , no wheezing, no respiratory distress Abdomen: soft non-distended,  Skin: wound dressed Neuro: nonfocal  CBC:    BMET Recent Labs    06/03/19 0838 06/05/19 0245  NA 140 139  K 3.6 3.2*  CL 109 106  CO2 23 26  GLUCOSE 158* 90  BUN 8 10  CREATININE 0.58* 0.52*  CALCIUM 9.5 9.1     Liver Panel  No results for input(s): PROT, ALBUMIN, AST, ALT, ALKPHOS, BILITOT, BILIDIR, IBILI in the last 72 hours.     Sedimentation Rate Recent Labs    06/03/19 1332  ESRSEDRATE 40*   C-Reactive Protein Recent Labs    06/03/19 1332  CRP 5.1*    Micro Results: Recent Results (from the past 720 hour(s))  Urine culture     Status: Abnormal   Collection Time: 05/30/19 12:30 PM   Specimen: Urine, Random  Result Value Ref Range Status   Specimen Description URINE, RANDOM  Final   Special Requests   Final    NONE Performed at [facility], [place] [address], [place], [place] [address]    Culture >=100,000 COLONIES/mL YEAST (A)  Final   Report Status 05/31/2019 FINAL  Final  Respiratory Panel by RT PCR (Flu A&B, Covid) - Nasopharyngeal Swab     Status: None   Collection Time: 05/30/19  6:07 PM   Specimen: Nasopharyngeal Swab  Result Value Ref Range Status   SARS Coronavirus 2 by RT PCR NEGATIVE NEGATIVE Final    Comment: (NOTE) SARS-CoV-2 target nucleic acids are NOT DETECTED. The SARS-CoV-2 RNA is generally detectable in upper respiratoy specimens during the acute phase of infection. The lowest concentration of SARS-CoV-2 viral copies this assay can detect is 131 copies/mL. A negative result does not preclude  SARS-Cov-2 infection and should not be used as the sole basis for treatment or other patient management decisions. A negative result may occur with  improper specimen collection/handling, submission of specimen other than nasopharyngeal swab, presence of viral mutation(s) within the areas targeted by this assay, and inadequate number of viral copies (<131 copies/mL). A negative result  must be combined with clinical observations, patient history, and epidemiological information. The expected result is Negative. Fact Sheet for Patients:  PinkCheek.be Fact Sheet for Healthcare Providers:  GravelBags.it This test is not yet ap proved or cleared by the [place] FDA and  has been authorized for detection and/or diagnosis of SARS-CoV-2 by FDA under an Emergency Use Authorization (EUA). This EUA will remain  in effect (meaning this test can be used) for the duration of the COVID-19 declaration under Section 564(b)(1) of the Act, 21 U.S.C. section 360bbb-3(b)(1), unless the authorization is terminated or revoked sooner.    Influenza A by PCR NEGATIVE NEGATIVE Final   Influenza B by PCR NEGATIVE NEGATIVE Final    Comment: (NOTE) The Xpert Xpress SARS-CoV-2/FLU/RSV assay is intended as an aid in  the diagnosis of influenza from Nasopharyngeal swab specimens and  should not be used as a sole basis for treatment. Nasal washings and  aspirates are unacceptable for Xpert Xpress SARS-CoV-2/FLU/RSV  testing. Fact Sheet for Patients: PinkCheek.be Fact Sheet for Healthcare Providers: GravelBags.it This test is not yet approved or cleared by the [place] FDA and  has been authorized for detection and/or diagnosis of SARS-CoV-2 by  FDA under an Emergency Use Authorization (EUA). This EUA will remain  in effect (meaning this test can be used) for the duration of the  Covid-19  declaration under Section 564(b)(1) of the Act, 21  U.S.C. section 360bbb-3(b)(1), unless the authorization is  terminated or revoked. Performed at [facility], [place] [address] [place], [place] [address]   Aerobic Culture (superficial specimen)     Status: None   Collection Time: 05/31/19 12:19 PM   Specimen: Wound  Result Value Ref Range Status   Specimen Description WOUND LEFT HIP  Final   Special Requests NONE  Final   Gram Stain   Final    MODERATE WBC PRESENT, PREDOMINANTLY PMN RARE GRAM NEGATIVE RODS    Culture   Final    FEW ESCHERICHIA COLI Two isolates with different morphologies were identified as the same organism.The most resistant organism was reported. Performed at [facility], [place] [address] [place], [place] [address]    Report Status 06/03/2019 FINAL  Final   Organism ID, Bacteria ESCHERICHIA COLI  Final      Susceptibility   Escherichia coli - MIC*    AMPICILLIN >=32 RESISTANT Resistant     CEFAZOLIN <=4 SENSITIVE Sensitive     CEFEPIME <=1 SENSITIVE Sensitive     CEFTAZIDIME <=1 SENSITIVE Sensitive     CEFTRIAXONE <=1 SENSITIVE Sensitive     CIPROFLOXACIN >=4 RESISTANT Resistant     GENTAMICIN >=16 RESISTANT Resistant     IMIPENEM <=0.25 SENSITIVE Sensitive     TRIMETH/SULFA >=320 RESISTANT Resistant     AMPICILLIN/SULBACTAM >=32 RESISTANT Resistant     PIP/TAZO <=4 SENSITIVE Sensitive     * FEW ESCHERICHIA COLI    Studies/Results: [place] EKG SITE RITE  Result Date: 06/04/2019 If Site Rite image not attached, placement could not be confirmed due to current cardiac rhythm.     Assessment/Plan:  INTERVAL HISTORY: Nausea and vomiting after switched to cefepime and metronidazole   Principal Problem:   Wound infection, posttraumatic Active Problems:   Chronic pain syndrome   Multiple traumatic injuries   Anxiety and depression   Hydronephrosis concurrent with and due to calculi of kidney and ureter   Chronic osteomyelitis of pelvis,  left (HCC)   Kidney stone   Acute lower UTI  Protein-calorie malnutrition, severe    Christopher [name] is a 53 y.o. male with osteomyelitis of pelvis that sp excision of bone in September by Dr. Doreatha Martin. He had grown anerobes from deep culture at that time. He has previously grown Serratia and Pseudomonas from blood cultures. There is a "deep surgical culture" that Dr. Marla Roe took (though I am not entirely sure of how deep a culture this was that grew an E coli. He was admitted for kidney stone and underwent nephrostomy with relief. He grew Candida in urine. He was incidentally found to have chronic osteo  On imaging. In talking to the patient and his wife today he HAS had increased pain in hip and drainage from sinus tract AFTER IV antibiotics were stopped. 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  The laboratory monitoring of the same as would be for the cefepime and he has a scheduled follow-up appointment with me in the outpatient world as documented in my prior note.  Nausea and vomiting: Would attribute this to metronidazole potentially  Candiduria: Suprapubic catheter has been replaced and a greatly appreciate urology having done this  I will sign off for now please call with further questions      LOS: 5 days   [name] 06/05/2019, 2:41 PM

## 2019-06-05 NOTE — Discharge Summary (Signed)
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 Sensitive     CEFEPIME <=1 SENSITIVE Sensitive     CEFTAZIDIME <=  1 SENSITIVE Sensitive     CEFTRIAXONE <=1 SENSITIVE Sensitive     CIPROFLOXACIN >=4 RESISTANT Resistant     GENTAMICIN >=16 RESISTANT Resistant     IMIPENEM <=0.25 SENSITIVE Sensitive     TRIMETH/SULFA >=320 RESISTANT Resistant     AMPICILLIN/SULBACTAM >=32 RESISTANT Resistant     PIP/TAZO <=4 SENSITIVE Sensitive     * FEW ESCHERICHIA COLI     Time coordinating discharge:  Over 30 minutes  SIGNED:   Darliss Cheney, MD  [facility] 06/05/2019, 8:41 AM  If 7PM-7AM, please contact night-coverage [email or web address] Password [email or web address]

## 2019-06-05 NOTE — Progress Notes (Signed)
PHARMACY CONSULT NOTE FOR:  OUTPATIENT  PARENTERAL ANTIBIOTIC THERAPY (OPAT)  Indication: Osteomyelitis Regimen: Meropenem 1 gm q 8 hours  End date: 07/15/2019  IV antibiotic discharge orders are pended. To discharging provider:  please sign these orders via discharge navigator,  Select New Orders & click on the button choice - Manage This Unsigned Work.     Thank you for allowing pharmacy to be a part of this patient's care.  Jimmy Footman, PharmD, BCPS, BCIDP Infectious Diseases Clinical Pharmacist Phone: [phone] 06/05/2019, 1:05 PM

## 2019-06-05 NOTE — Progress Notes (Signed)
Wife called and discharge teaching was given via phone.
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
## 2019-06-05 NOTE — Progress Notes (Signed)
Discharge Home per [facility] transporting patient. HHneeds addressed by Case manager. Patient refused for me to call wife for any teaching. Wound dressing instructed to patient.

## 2019-06-06 ENCOUNTER — Other Ambulatory Visit: Payer: Self-pay | Admitting: Physical Medicine and Rehabilitation

## 2019-06-06 ENCOUNTER — Telehealth: Payer: Self-pay

## 2019-06-06 DIAGNOSIS — F329 Major depressive disorder, single episode, unspecified: Secondary | ICD-10-CM

## 2019-06-06 DIAGNOSIS — F064 Anxiety 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: Secondary | ICD-10-CM

## 2019-06-06 NOTE — Telephone Encounter (Signed)
Transition Care Management Follow-up Telephone Call  Date of discharge and from where[facility]  How have you been since you were released from the hospital? Stable   Any questions or concerns? No   Items Reviewed:  Did the pt receive and understand the discharge instructions provided? Yes   Medications obtained and verified? Yes   Any new allergies since your discharge? No   Dietary orders reviewed? Yes  Do you have support at home? Yes   Other (ie: DME, Home Health, etc) current home health services   Functional Questionnaire: (I = Independent and D = Dependent) ADL's: Assistance needed   Bathing/Dressing- I   Meal Prep- I  Eating- I  Maintaining continence- I  Transferring/Ambulation- Assistance needed   Managing Meds- I   Follow up appointments reviewed:    PCP Hospital f/u appt confirmed? Patient would like to hold on scheduling at this time due to [facility] specialist appointments    Specialist Hospital f/u appt confirmed? Yes  urology, ID, and plastic surgeon   Are transportation arrangements needed? No   If their condition worsens, is the pt aware to call  their PCP or go to the ED? Yes  Was the patient provided with contact information for the PCP's office or ED? Yes  Was the pt encouraged to call back with questions or concerns? Yes

## 2019-06-11 ENCOUNTER — Telehealth: Payer: Self-pay | Admitting: Family Medicine

## 2019-06-11 NOTE — Telephone Encounter (Signed)
See note

## 2019-06-11 NOTE — Telephone Encounter (Signed)
Christopher [name], with [facility], calling to request   PT  Frequency: 2w6, Christopher [name] #  [phone]

## 2019-06-11 NOTE — Telephone Encounter (Signed)
Notified [facility] ok for verbal order

## 2019-06-13 ENCOUNTER — Telehealth: Payer: Self-pay | Admitting: Plastic Surgery

## 2019-06-13 NOTE — Telephone Encounter (Signed)
Spoke with Levada Dy with [facility]. She said they initially thought the blister was due to the silver alginate, so they switched back to the calcium alginate that he had used previously and things did not improve. So they don't feel its a reaction to the silver alginate.  Recommended they could switch to using regular [place] Jelly until his next appt (1/8) if they want to give him a break from the alginate to see if it was a reaction and if things improve. The fluid filled area described sounds like the same as he was getting previously. He is currently on IV Merrem. If he begins spiking fevers, or his pain increases like before he should go to the emergency room.

## 2019-06-13 NOTE — Telephone Encounter (Signed)
Levada Dy from [facility] called to let us know that Christopher [name] has a fluid filled blister around the opening of the wound and has fluid buildup on the back of his left hip. She thinks it might be an allergic reaction to the Silver alginate and wanted to know if this is something Dr. Arville Lime, would like to continue or should they change the treatment orders. Please call back to advise.

## 2019-06-17 ENCOUNTER — Encounter: Payer: Self-pay | Admitting: Internal Medicine

## 2019-06-21 ENCOUNTER — Ambulatory Visit: Payer: BC Managed Care – PPO | Admitting: Plastic Surgery

## 2019-06-21 ENCOUNTER — Other Ambulatory Visit: Payer: Self-pay | Admitting: Urology

## 2019-06-21 ENCOUNTER — Telehealth: Payer: Self-pay

## 2019-06-21 ENCOUNTER — Other Ambulatory Visit: Payer: Self-pay

## 2019-06-21 ENCOUNTER — Ambulatory Visit (INDEPENDENT_AMBULATORY_CARE_PROVIDER_SITE_OTHER): Payer: No Typology Code available for payment source | Admitting: Plastic Surgery

## 2019-06-21 ENCOUNTER — Encounter: Payer: Self-pay | Admitting: Plastic Surgery

## 2019-06-21 VITALS — BP 109/69 | HR 75 | Temp 96.8°F

## 2019-06-21 DIAGNOSIS — S31829D Unspecified open wound of left buttock, subsequent encounter: Secondary | ICD-10-CM
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
## 2019-06-21 NOTE — Telephone Encounter (Signed)
Dorathy Daft, OT from [facility] called requesting verbal orders for HHOT extension 1wk6. Orders approved and given.

## 2019-06-21 NOTE — Addendum Note (Signed)
Addended by: Peggye Form on: 06/21/2019 01:40 PM   Modules accepted: Orders

## 2019-06-24 ENCOUNTER — Encounter (HOSPITAL_COMMUNITY): Payer: Self-pay

## 2019-06-24 DIAGNOSIS — S31000A Unspecified open wound of lower back and pelvis without penetration into retroperitoneum, initial encounter: Secondary | ICD-10-CM

## 2019-06-24 DIAGNOSIS — M86652 Other chronic osteomyelitis, left thigh: Secondary | ICD-10-CM
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
## 2019-06-27 ENCOUNTER — Other Ambulatory Visit: Payer: Self-pay | Admitting: Physical Medicine and Rehabilitation

## 2019-06-27 ENCOUNTER — Telehealth: Payer: Self-pay

## 2019-06-27 MED ORDER — OXYCODONE-ACETAMINOPHEN 5-325 MG PO TABS: 1.0000 | ORAL_TABLET | ORAL | 0 refills | Status: DC | PRN

## 2019-06-27 NOTE — Telephone Encounter (Signed)
[facility] patient wife called requesting refill on Oxycodone/APAP last filled 05/24/2019 next appt 07/08/2019. [facility] in [place], [place]

## 2019-07-02 ENCOUNTER — Ambulatory Visit: Payer: BC Managed Care – PPO | Admitting: Infectious Disease

## 2019-07-04 ENCOUNTER — Other Ambulatory Visit: Payer: Self-pay

## 2019-07-04 ENCOUNTER — Encounter (HOSPITAL_COMMUNITY): Payer: Self-pay

## 2019-07-04 ENCOUNTER — Other Ambulatory Visit: Payer: Self-pay | Admitting: Plastic Surgery

## 2019-07-04 ENCOUNTER — Ambulatory Visit (HOSPITAL_COMMUNITY)
Admission: RE | Admit: 2019-07-04 | Discharge: 2019-07-04 | Disposition: A | Discharge status: Home / Self Care | Payer: No Typology Code available for payment source | Source: Ambulatory Visit | Attending: Plastic Surgery | Admitting: Plastic Surgery

## 2019-07-04 ENCOUNTER — Other Ambulatory Visit: Payer: Self-pay | Admitting: Student

## 2019-07-04 DIAGNOSIS — S31829D Unspecified open wound of left buttock, subsequent encounter: Secondary | ICD-10-CM

## 2019-07-04 LAB — CBC
HCT: 38 % — ABNORMAL LOW (ref 39.0–52.0)
Hemoglobin: 12.1 g/dL — ABNORMAL LOW (ref 13.0–17.0)
MCH: 32.4 pg (ref 26.0–34.0)
MCHC: 31.8 g/dL (ref 30.0–36.0)
MCV: 101.6 fL — ABNORMAL HIGH (ref 80.0–100.0)
Platelets: 298 10*3/uL (ref 150–400)
RBC: 3.74 MIL/uL — ABNORMAL LOW (ref 4.22–5.81)
RDW: 13 % (ref 11.5–15.5)
WBC: 5.4 10*3/uL (ref 4.0–10.5)
nRBC: 0 % (ref 0.0–0.2)

## 2019-07-04 LAB — APTT: aPTT: 35 seconds (ref 24–36)

## 2019-07-04 LAB — PROTIME-INR
INR: 1.1 (ref 0.8–1.2)
Prothrombin Time: 13.9 seconds (ref 11.4–15.2)

## 2019-07-04 MED ORDER — HEPARIN SOD (PORK) LOCK FLUSH 100 UNIT/ML IV SOLN
INTRAVENOUS | Status: AC
  Administered 2019-07-04: 250 [IU]
  Filled 2019-07-04: qty 5

## 2019-07-04 MED ORDER — FENTANYL CITRATE (PF) 100 MCG/2ML IJ SOLN
INTRAMUSCULAR | Status: AC
  Filled 2019-07-04: qty 2

## 2019-07-04 MED ORDER — LIDOCAINE HCL (PF) 1 % IJ SOLN
INTRAMUSCULAR | Status: AC
  Filled 2019-07-04: qty 30

## 2019-07-04 MED ORDER — SODIUM CHLORIDE 0.9 % IV SOLN: INTRAVENOUS | Status: DC

## 2019-07-04 MED ORDER — MIDAZOLAM HCL 2 MG/2ML IJ SOLN
INTRAMUSCULAR | Status: AC
  Filled 2019-07-04: qty 2

## 2019-07-04 MED ORDER — ALBUTEROL SULFATE HFA 108 (90 BASE) MCG/ACT IN AERS: 2.0000 | INHALATION_SPRAY | Freq: Four times a day (QID) | RESPIRATORY_TRACT | 1 refills | Status: DC | PRN

## 2019-07-04 MED ORDER — FENTANYL CITRATE (PF) 100 MCG/2ML IJ SOLN
INTRAMUSCULAR | Status: AC | PRN
  Administered 2019-07-04: 50 ug via INTRAVENOUS

## 2019-07-04 MED ORDER — MIDAZOLAM HCL 2 MG/2ML IJ SOLN
INTRAMUSCULAR | Status: AC | PRN
  Administered 2019-07-04: 1 mg via INTRAVENOUS

## 2019-07-04 NOTE — Addendum Note (Signed)
Addended by: Jimmye Norman on: 07/04/2019 07:56 AM   Modules accepted: Orders

## 2019-07-04 NOTE — Discharge Instructions (Addendum)
Percutaneous Abscess Drain Percutaneous abscess drain is removal of a collection of infected fluid inside the body (abscess). This is done by placing a thin needle under the skin and moving it into the abscess. A small tube (catheter) is inserted during the procedure and left in place for a few days to continue to drain the abscess. Tell a health care provider about:  Any allergies you have.  All medicines you are taking, including vitamins, herbs, eye drops, creams, and over-the-counter medicines.  Any problems you or family members have had with anesthetic medicines.  Any blood disorders you have.  Any surgeries you have had.  Any medical conditions you have.  Whether you are pregnant or may be pregnant.  Any history of tobacco use or smoking. What are the risks? Generally, this is a safe procedure. However, problems may occur, including:  Infection.  Bleeding.  Allergic reaction to medicines or materials used.  Damage to other structures or organs.  Blockage of the catheter, requiring placement of a new catheter.  A need to repeat the procedure.  Failure of the procedure to drain the abscess completely, requiring an open surgical procedure to drain the abscess. An open procedure is done through a larger incision. What happens before the procedure?  Medicines  Ask your health care provider about: ? Changing or stopping your regular medicines. This is especially important if you are taking diabetes medicines or blood thinners. ? Taking medicines such as aspirin and ibuprofen. These medicines can thin your blood. Do not take these medicines before your procedure if your health care provider instructs you not to. Staying hydrated Follow instructions from your health care provider about hydration, which may include:  Up to 2 hours before the procedure - you may continue to drink clear liquids, such as water, clear fruit juice, black coffee, and plain tea. Eating and  drinking restrictions Follow instructions from your health care provider about eating and drinking, which may include:  8 hours before the procedure - stop eating heavy meals or foods such as meat, fried foods, or fatty foods.  6 hours before the procedure - stop eating light meals or foods, such as toast or cereal.  6 hours before the procedure - stop drinking milk or drinks that contain milk.  2 hours before the procedure - stop drinking clear liquids. General instructions  Plan to have someone take you home from the hospital or clinic.  If you will be going home right after the procedure, plan to have someone with you for 24 hours.  You may have blood tests or urine tests.  You may get a tetanus shot.  You may have imaging tests, such as an ultrasound, to check how large or deep your abscess is. What happens during the procedure?  To lower your risk of infection: ? Your health care team will wash or sanitize their hands. ? The skin around the abscess will be washed with soap. ? Hair may be removed from the surgical site.  An IV tube will be inserted into one of your veins.  You will be given medicine to numb the area (local anesthetic) where the catheter will be placed. Placement of the catheter varies depending on where your abscess is located.  You may be given medicine to help you relax (sedative) or medicine to make you fall asleep (general anesthetic).  A small incision will be made in your skin.  A needle will be inserted under your skin and moved into the abscess.  Images from ultrasound, X-ray, or a CT scan will be used to help guide the needle to the abscess.  A catheter will be inserted into your incision and moved underneath your skin until it reaches the abscess. Images will be used to help guide the catheter to the abscess.  After the catheter is in place, the needle will be removed. The catheter will be connected to a bag outside of your body. The catheter will  stay in place until the fluid has stopped draining and the infection is gone. The procedure may vary among health care providers and hospitals. What happens after the procedure?  Your blood pressure, heart rate, breathing rate, and blood oxygen level will be monitored until the medicines you were given have worn off.  You may have some pain or nausea. Medicines will be available to help you. Summary  An abscess is a collection of infected fluid inside the body.  During this procedure, images from ultrasound, X-rays, or a CT scan are used to help guide the needle and catheter to the abscess.  A catheter will be left in place to continue to drain the abscess after the procedure. This information is not intended to replace advice given to you by your health care provider. Make sure you discuss any questions you have with your health care provider. Document Revised: 05/12/2017 Document Reviewed: 04/21/2016 Elsevier Patient Education  [place]. Moderate Conscious Sedation, Adult Sedation is the use of medicines to promote relaxation and relieve discomfort and anxiety. Moderate conscious sedation is a type of sedation. Under moderate conscious sedation, you are less alert than normal, but you are still able to respond to instructions, touch, or both. Moderate conscious sedation is used during short medical and dental procedures. It is milder than deep sedation, which is a type of sedation under which you cannot be easily woken up. It is also milder than general anesthesia, which is the use of medicines to make you unconscious. Moderate conscious sedation allows you to return to your regular activities sooner. Tell a health care provider about:  Any allergies you have.  All medicines you are taking, including vitamins, herbs, eye drops, creams, and over-the-counter medicines.  Use of steroids (by mouth or creams).  Any problems you or family members have had with sedatives and  anesthetic medicines.  Any blood disorders you have.  Any surgeries you have had.  Any medical conditions you have, such as sleep apnea.  Whether you are pregnant or may be pregnant.  Any use of cigarettes, alcohol, marijuana, or street drugs. What are the risks? Generally, this is a safe procedure. However, problems may occur, including:  Getting too much medicine (oversedation).  Nausea.  Allergic reaction to medicines.  Trouble breathing. If this happens, a breathing tube may be used to help with breathing. It will be removed when you are awake and breathing on your own.  Heart trouble.  Lung trouble. What happens before the procedure? Staying hydrated Follow instructions from your health care provider about hydration, which may include:  Up to 2 hours before the procedure - you may continue to drink clear liquids, such as water, clear fruit juice, black coffee, and plain tea. Eating and drinking restrictions Follow instructions from your health care provider about eating and drinking, which may include:  8 hours before the procedure - stop eating heavy meals or foods such as meat, fried foods, or fatty foods.  6 hours before the procedure - stop eating light meals or  foods, such as toast or cereal.  6 hours before the procedure - stop drinking milk or drinks that contain milk.  2 hours before the procedure - stop drinking clear liquids. Medicine Ask your health care provider about:  Changing or stopping your regular medicines. This is especially important if you are taking diabetes medicines or blood thinners.  Taking medicines such as aspirin and ibuprofen. These medicines can thin your blood. Do not take these medicines before your procedure if your health care provider instructs you not to.  Tests and exams  You will have a physical exam.  You may have blood tests done to show: ? How well your kidneys and liver are working. ? How well your blood can  clot. General instructions  Plan to have someone take you home from the hospital or clinic.  If you will be going home right after the procedure, plan to have someone with you for 24 hours. What happens during the procedure?  An IV tube will be inserted into one of your veins.  Medicine to help you relax (sedative) will be given through the IV tube.  The medical or dental procedure will be performed. What happens after the procedure?  Your blood pressure, heart rate, breathing rate, and blood oxygen level will be monitored often until the medicines you were given have worn off.  Do not drive for 24 hours. This information is not intended to replace advice given to you by your health care provider. Make sure you discuss any questions you have with your health care provider. Document Revised: 05/12/2017 Document Reviewed: 09/19/2015 Elsevier Patient Education  2020 [employer].
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
## 2019-07-04 NOTE — Procedures (Signed)
Interventional Radiology Procedure Note  Procedure:    US guided aspirate of left flank subcu tissue.  The [place] does not show any fluid correlate to the physical exam findings.  No discrete pocket of fluid.  The fluid aspirated was scant, and directly on the pelvic bone surface, ~1cc.    Complications: None  Recommendations:  - Ok to shower tomorrow - Do not submerge for 7 days - Routine care   Signed,  Yvone Neu. Loreta Ave, DO

## 2019-07-08 ENCOUNTER — Encounter: Payer: Self-pay | Admitting: Physical Medicine and Rehabilitation

## 2019-07-08 ENCOUNTER — Encounter
Payer: No Typology Code available for payment source | Attending: Physical Medicine and Rehabilitation | Admitting: Physical Medicine and Rehabilitation

## 2019-07-08 ENCOUNTER — Other Ambulatory Visit: Payer: Self-pay

## 2019-07-08 VITALS — BP 108/69 | HR 83 | Temp 97.7°F | Ht 75.0 in | Wt 141.0 lb

## 2019-07-08 DIAGNOSIS — Z79891 Long term (current) use of opiate analgesic: Secondary | ICD-10-CM

## 2019-07-08 DIAGNOSIS — G8921 Chronic pain due to trauma: Secondary | ICD-10-CM | POA: Diagnosis not present

## 2019-07-08 DIAGNOSIS — Z452 Encounter for adjustment and management of vascular access device: Secondary | ICD-10-CM

## 2019-07-08 DIAGNOSIS — Z933 Colostomy status: Secondary | ICD-10-CM | POA: Diagnosis present

## 2019-07-08 DIAGNOSIS — Z9359 Other cystostomy status: Secondary | ICD-10-CM | POA: Diagnosis present

## 2019-07-08 DIAGNOSIS — T07XXXA Unspecified multiple injuries, initial encounter: Secondary | ICD-10-CM

## 2019-07-08 DIAGNOSIS — S31829D Unspecified open wound of left buttock, subsequent encounter: Secondary | ICD-10-CM | POA: Diagnosis not present

## 2019-07-08 DIAGNOSIS — N200 Calculus of kidney: Secondary | ICD-10-CM

## 2019-07-08 MED ORDER — FENTANYL 50 MCG/HR TD PT72: 1.0000 | MEDICATED_PATCH | TRANSDERMAL | 0 refills | Status: DC
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
## 2019-07-08 NOTE — Progress Notes (Signed)
Subjective:    Patient ID: Christopher [name], male    DOB: 08/03/1965, 54 y.o.   MRN: [id number]  HPI   Has a R kidney stone- has new bag /stent for that one- however has surgery scheduled 2/1 to remove that kidney stone.  Went into hospital late 12/20 Doing IV ABX 3x/day (Meropenem?) for continued osteomyelitis L hip wound.  Had procedure last week-  Swelling above wound- nothing to drain. So sensitive there and c/o pain all the time.  Thought there was a pocket there to drain, but there wasn't.   Did do a needle aspiration to send off.    Home since [place]. 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   Asking about handicapped Christopher [name].  To get out to church, etc.  Couldn't even get out with family at all.     needs trigger point injections.  Forgot to call to get them Still taking Marinol but only 1x/day   New wound on sacrum- acquired it during last hospitalization in December Almost healed up again.           Pain Inventory Average Pain 7 Pain Right Now 5 My pain is sharp, stabbing, tingling and aching  In the last 24 hours, has pain interfered with the following? General activity 7 Relation with others 8 Enjoyment of life 9 What TIME of day is your pain at its worst? daytime Sleep (in general) Fair  Pain is worse with: walking, bending, inactivity, standing and some activites Pain improves with: rest and medication Relief from Meds: 7  Mobility ability to climb steps?  no do you drive?  no  Function I need assistance with the following:  dressing, bathing, toileting,  meal prep, household duties and shopping  Neuro/Psych bladder control problems weakness numbness tremor tingling trouble walking spasms dizziness depression anxiety  Prior Studies Any changes since last visit?  no  Physicians involved in your care Any changes since last visit?  no   Family History  Problem Relation Age of Onset  . Breast cancer Mother        with mets to the bones   Social History   Socioeconomic History  . Marital status: Married    Spouse name: Not on file  . Number of children: Not on file  . Years of education: Not on file  . Highest education level: Not on file  Occupational History  . Not on file  Tobacco Use  . Smoking status: Former Smoker    Packs/day: 1.00    Years: 20.00    Pack years: 20.00    Types: Cigarettes    Quit date: 07/10/2018    Years since quitting: 0.9  . Smokeless tobacco: Never Used  Substance and Sexual Activity  . Alcohol use: Never  . Drug use: Never  . Sexual activity: Yes  Other Topics Concern  . Not on file  Social History Narrative   ** Merged History Encounter **       Social Determinants of Health   Financial Resource Strain: Low Risk   . Difficulty of Paying  Living Expenses: Not very hard  Food Insecurity: Food Insecurity Present  . Worried About Programme researcher, broadcasting/film/video in the Last Year: Sometimes true  . Ran Out of Food in the Last Year: Sometimes true  Transportation Needs: No Transportation Needs  . Lack of Transportation (Medical): No  . Lack of Transportation (Non-Medical): No  Physical Activity: Inactive  . Days of Exercise per Week: 0 days  . Minutes of Exercise per Session: 0 min  Stress: Stress Concern Present  . Feeling of Stress : Rather much  Social Connections:   . Frequency of Communication with Friends and Family: Not on file  . Frequency of Social Gatherings with Friends and Family: Not on file  . Attends Religious Services: Not on file  . Active Member of Clubs or Organizations:  Not on file  . Attends Banker Meetings: Not on file  . Marital Status: Not on file   Past Surgical History:  Procedure Laterality Date  . APPLICATION OF A-CELL OF BACK N/A 08/06/2018   Procedure: Application Of A-Cell Of Back;  Surgeon: Peggye Form, DO;  [facility]: [facility];  Service: Plastics;  Laterality: N/A;  . APPLICATION OF A-CELL OF EXTREMITY Left 08/06/2018   Procedure: Application Of A-Cell Of Extremity;  Surgeon: Peggye Form, DO;  Location: [facility] OR;  Service: Plastics;  Laterality: Left;  . APPLICATION OF WOUND VAC  07/12/2018   Procedure: Application Of Wound Vac to the Left Thigh and Scrotum.;  Surgeon: Roby Lofts, MD;  Location: [facility] OR;  Service: Orthopedics;;  . APPLICATION OF WOUND VAC  07/10/2018   Procedure: Application Of Wound Vac;  Surgeon: Berna Bue, MD;  Location: [facility] OR;  Service: General;;  . COLOSTOMY N/A 07/23/2018   Procedure: COLOSTOMY;  Surgeon: Violeta Gelinas, MD;  Location: [facility] OR;  Service: General;  Laterality: N/A;  . CYSTOSCOPY W/ URETERAL STENT PLACEMENT N/A 07/15/2018   Procedure: RETROGRADE URETHROGRAM;  Surgeon: Marcine Matar, MD;  Location: [facility] OR;  Service: Urology;  Laterality: N/A;  . CYSTOSCOPY WITH LITHOLAPAXY N/A 05/06/2019   Procedure: CYSTOSCOPY BASKET BLADDER STONE EXTRACTION;  Surgeon: Malen Gauze, MD;  Location: [facility];  Service: Urology;  Laterality: N/A;  30 MINS  . CYSTOSTOMY N/A 05/06/2019   Procedure: REPLACEMENT OF SUPRAPUBIC CATHETER;  Surgeon: Malen Gauze, MD;  Location: [facility];  Service: Urology;  Laterality: N/A;  . DEBRIDEMENT AND CLOSURE WOUND Left 03/04/2019   Procedure: Excision of hip wound with placement of Acell;  Surgeon: Peggye Form, DO;  [facility];  Service: Plastics;  Laterality: Left;  . ESOPHAGOGASTRODUODENOSCOPY N/A 08/14/2018   Procedure: ESOPHAGOGASTRODUODENOSCOPY (EGD);  Surgeon: Violeta Gelinas, MD;  Location: [facility]  ENDOSCOPY;  Service: General;  Laterality: N/A;  bedside  . FACIAL RECONSTRUCTION SURGERY     X 2--once as a teenager and second time in his 89's  . HARDWARE REMOVAL Left 03/04/2019   Procedure: Left Hip Hardware Removal;  Surgeon: Roby Lofts, MD;  [facility]: [facility];  Service: Orthopedics;  Laterality: Left;  . HIP PINNING,CANNULATED Left 07/12/2018   Procedure: CANNULATED HIP PINNING;  Surgeon: Roby Lofts, MD;  [facility]: [facility];  Service: Orthopedics;  Laterality: Left;  . HIP SURGERY    . I & D EXTREMITY Left 07/25/2018   Procedure: Debridement of buttock, scrotum and left leg, placement of acell and vac;  Surgeon: Peggye Form, DO;  [facility]: [facility];  Service: Plastics;  Laterality: Left;  .  I & D EXTREMITY N/A 08/06/2018   Procedure: Debridement of buttock, scrotum and left leg;  Surgeon: Peggye Formillingham, Claire S, DO;  [facility]: [facility];  Service: Plastics;  Laterality: N/A;  . I & D EXTREMITY N/A 08/13/2018   Procedure: Debridement of buttock, scrotum and left leg, placement of acell and vac;  Surgeon: Peggye Formillingham, Claire S, DO;  [facility]: [facility];  Service: Plastics;  Laterality: N/A;  90 min, please  . INCISION AND DRAINAGE OF WOUND N/A 07/18/2018   Procedure: Debridement of left leg, buttocks and scrotal wound with placement of acell and Flexiseal;  Surgeon: Peggye Formillingham, Claire S, DO;  [facility];  Service: Plastics;  Laterality: N/A;  . INCISION AND DRAINAGE OF WOUND Left 08/29/2018   Procedure: Debridement of buttock, scrotum and left leg, placement of acell and vac;  Surgeon: Peggye Formillingham, Claire S, DO;  [facility]: [facility];  Service: Plastics;  Laterality: Left;  75 min, please  . INCISION AND DRAINAGE OF WOUND Bilateral 10/23/2018   Procedure: DEBRIDEMENT OF BUTTOCK,SCROTUM, AND LEG WOUNDS WITH PLACEMENT OF ACELL- BILATERAL 90 MIN;  Surgeon: Peggye Formillingham, Claire S, DO;  [facility]: [facility];  Service: Plastics;  Laterality: Bilateral;  . IR ANGIOGRAM PELVIS SELECTIVE OR SUPRASELECTIVE  07/10/2018    . IR ANGIOGRAM PELVIS SELECTIVE OR SUPRASELECTIVE  07/10/2018  . IR ANGIOGRAM SELECTIVE EACH ADDITIONAL VESSEL  07/10/2018  . IR EMBO ART  VEN HEMORR LYMPH EXTRAV  INC GUIDE ROADMAPPING  07/10/2018  . IR NEPHROSTOMY PLACEMENT LEFT  04/05/2019  . IR NEPHROSTOMY PLACEMENT RIGHT  05/31/2019  . IR US GUIDE BX ASP/DRAIN  07/10/2018  . IR US GUIDE VASC ACCESS RIGHT  07/10/2018  . IR VENO/EXT/UNI LEFT  07/10/2018  . IRRIGATION AND DEBRIDEMENT OF WOUND WITH SPLIT THICKNESS SKIN GRAFT Left 09/19/2018   Procedure: Debridement of gluteal wound with placement of acell to left leg.;  Surgeon: Peggye Formillingham, Claire S, DO;  [facility];  Service: Plastics;  Laterality: Left;  2.5 hours, please  . LAPAROTOMY N/A 07/12/2018   Procedure: EXPLORATORY LAPAROTOMY;  Surgeon: Violeta Gelinashompson, Burke, MD;  [facility];  Service: General;  Laterality: N/A;  . LAPAROTOMY N/A 07/15/2018   Procedure: WOUND EXPLORATION; CLOSURE OF ABDOMEN;  Surgeon: Violeta Gelinashompson, Burke, MD;  [facility];  Service: General;  Laterality: N/A;  . LAPAROTOMY  07/10/2018   Procedure: Exploratory Laparotomy;  Surgeon: Berna Bueonnor, Chelsea A, MD;  [facility];  Service: General;;  . PEG PLACEMENT N/A 08/14/2018   Procedure: PERCUTANEOUS ENDOSCOPIC GASTROSTOMY (PEG) PLACEMENT;  Surgeon: Violeta Gelinashompson, Burke, MD;  [facility];  Service: General;  Laterality: N/A;  . PERCUTANEOUS TRACHEOSTOMY N/A 08/02/2018   Procedure: PERCUTANEOUS TRACHEOSTOMY;  Surgeon: Violeta Gelinashompson, Burke, MD;  [facility];  Service: General;  Laterality: N/A;  . RADIOLOGY WITH ANESTHESIA N/A 07/10/2018   Procedure: IR WITH ANESTHESIA;  Surgeon: Simonne ComeWatts, John, MD;  [facility];  Service: Radiology;  Laterality: N/A;  . RADIOLOGY WITH ANESTHESIA Right 07/10/2018   Procedure: Ir With Anesthesia;  Surgeon: Simonne ComeWatts, John, MD;  [facility];  Service: Radiology;  Laterality: Right;  . SCROTAL EXPLORATION N/A 07/15/2018   Procedure: SCROTUM DEBRIDEMENT;  Surgeon: Marcine Matarahlstedt, Stephen, MD;   [facility];  Service: Urology;  Laterality: N/A;  . SHOULDER SURGERY    . SKIN SPLIT GRAFT Right 09/19/2018   Procedure: Skin Graft Split Thickness;  Surgeon: Peggye Formillingham, Claire S, DO;  [facility];  Service: Plastics;  Laterality: Right;  . SKIN SPLIT GRAFT N/A 10/03/2018   Procedure:  Split thickness skin graft to gluteal area with acell placement;  Surgeon: Peggye Form, DO;  [facility];  Service: Plastics;  Laterality: N/A;  3 hours, please  . VACUUM ASSISTED CLOSURE CHANGE N/A 07/12/2018   Procedure: ABDOMINAL VACUUM ASSISTED CLOSURE CHANGE and abdominal washout;  Surgeon: Violeta Gelinas, MD;  Location: [facility] OR;  Service: General;  Laterality: N/A;  . WOUND DEBRIDEMENT Left 07/23/2018   Procedure: DEBRIDEMENT LEFT BUTTOCK  WOUND;  Surgeon: Violeta Gelinas, MD;  Location: [facility] OR;  Service: General;  Laterality: Left;  . WOUND EXPLORATION Left 07/10/2018   Procedure: WOUND EXPLORATION LEFT GROIN;  Surgeon: Berna Bue, MD;  Location: [facility] OR;  Service: General;  Laterality: Left;   Past Medical History:  Diagnosis Date  . Acute on chronic respiratory failure with hypoxia (HCC) 06/2018   trach removed 11-16-2018, on vent from jan until may 2020  . Bacteremia due to Pseudomonas 06/2018  . Chronic pain syndrome   . History of kidney stones   . Multiple traumatic injuries   . Wound discharge    left hip wound with bloody/clear drainage change dressing q day surgilube with gauze, between legs wound using calcium algenate pad bid   BP 108/69   Pulse 83   Temp 97.7 F (36.5 C)   Ht 6\' 3"  (1.905 m)   Wt 141 lb (64 kg)   SpO2 96%   BMI 17.62 kg/m   Opioid Risk Score:   Fall Risk Score:  `1  Depression screen PHQ 2/9  Depression screen [facility] 2/9 04/10/2019 12/27/2018  Decreased Interest 0 2  Down, Depressed, Hopeless 0 0  PHQ - 2 Score 0 2  Altered sleeping - 1  Tired, decreased energy - 2  Change in appetite - 1  Feeling bad or failure about yourself  - 1  Trouble  concentrating - 0  Moving slowly or fidgety/restless - 2  Suicidal thoughts - 0  PHQ-9 Score - 9     Review of Systems  Constitutional: Negative.   HENT: Negative.   Eyes: Negative.   Respiratory: Negative.   Cardiovascular: Negative.   Gastrointestinal: Negative.   Endocrine: Negative.   Genitourinary: Positive for difficulty urinating.  Musculoskeletal: Positive for arthralgias, back pain, gait problem and myalgias.  Skin: Negative.   Allergic/Immunologic: Negative.   Neurological: Positive for dizziness, tremors, weakness and numbness.  Hematological: Negative.   Psychiatric/Behavioral: Positive for dysphoric mood. The patient is nervous/anxious.   All other systems reviewed and are negative.      Objective:   Physical Exam  Awake, alert, appropriate, in power w/c, seated with angle to right, NAD PICC line in R arm Nephrostomy- and other tubes noted; colostomy Gaunt; a little less than last time- has gained 11 lbs Tight trigger points in shoulders, neck and upper back palpated Dry skin on neck and arms seen Accompanied by wife       Assessment & Plan:    Pt is a 54 yr old male with hx of Multitrauma- causing L femoral neck fx, degloving of L hip to going/scrotum, bladder neck trauma- got SPC, developed compartment syndrome -s/p surgery for that; also diverting colostomy, skin grafts, and s/p trachand PEG- PEG is out. 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 Refilled Oxycodone 1/14 and is done for now.  2. Durgesic patch due to be refilled within the next 4-8 days. Done.   3. Has enough Marinol- doesn't need refilled as of yet.   4. Will schedule TrP injection at next visit in February.   5. Think Massage therapy would be ideal for pt care, however will check with Case  manager to see if we could do, to reduce pain med usage?  6. Patient is in power w/c. - And will be for the foreseeable future- due to seating situation, cannot get into regular vehicle. And unable to tilt and get pressure off his bottom esp from sitting. Suggest seeing if Worker's comp can address this.  7. F/U in 1 month for TrP injections.    I spent a total of 45 minutes on appointment by itself ; 25 minutes discussing details/plan and additional 10 minutes taking with worker's comp as detailed above.

## 2019-07-09 ENCOUNTER — Ambulatory Visit (INDEPENDENT_AMBULATORY_CARE_PROVIDER_SITE_OTHER): Payer: No Typology Code available for payment source | Admitting: Infectious Disease

## 2019-07-09 ENCOUNTER — Encounter: Payer: Self-pay | Admitting: Infectious Disease

## 2019-07-09 ENCOUNTER — Telehealth: Payer: Self-pay

## 2019-07-09 ENCOUNTER — Other Ambulatory Visit: Payer: Self-pay

## 2019-07-09 ENCOUNTER — Encounter (HOSPITAL_BASED_OUTPATIENT_CLINIC_OR_DEPARTMENT_OTHER): Payer: Self-pay | Admitting: Urology

## 2019-07-09 VITALS — BP 125/79 | HR 93

## 2019-07-09 DIAGNOSIS — M86652 Other chronic osteomyelitis, left thigh: Secondary | ICD-10-CM

## 2019-07-09 DIAGNOSIS — F419 Anxiety disorder, unspecified: Secondary | ICD-10-CM | POA: Diagnosis not present

## 2019-07-09 DIAGNOSIS — T148XXA Other injury of unspecified body region, initial encounter: Secondary | ICD-10-CM

## 2019-07-09 DIAGNOSIS — L089 Local infection of the skin and subcutaneous tissue, unspecified: Secondary | ICD-10-CM | POA: Diagnosis not present

## 2019-07-09 DIAGNOSIS — F329 Major depressive disorder, single episode, unspecified: Secondary | ICD-10-CM

## 2019-07-09 DIAGNOSIS — F32A Depression, unspecified: Secondary | ICD-10-CM

## 2019-07-09 LAB — AEROBIC/ANAEROBIC CULTURE W GRAM STAIN (SURGICAL/DEEP WOUND)
Culture: NO GROWTH
Gram Stain: NONE SEEN

## 2019-07-09 MED ORDER — CEFDINIR 300 MG PO CAPS: 300.0000 mg | ORAL_CAPSULE | Freq: Two times a day (BID) | ORAL | 5 refills | Status: DC

## 2019-07-09 MED ORDER — METRONIDAZOLE 500 MG PO TABS: 500.0000 mg | ORAL_TABLET | Freq: Three times a day (TID) | ORAL | 5 refills | Status: DC

## 2019-07-09 NOTE — Progress Notes (Signed)
Nurse Case manager requesting copy of office note. Patient okay with this. Will fax note once completed. Laretta Bolster, Rn case manager P: [phone] F: [phone] Lorenso Courier, CMA

## 2019-07-09 NOTE — Progress Notes (Addendum)
Spoke w/ via phone for pre-op interview---wife, Christopher [name]---- none              Lab results------none COVID test ------07/11/2019 Arrive at -------1010 am on 07/15/2019 NPO after ------midnight Medications to take morning of surgery -----Fentanyl patch (Duragesic), Oxycodone-acetaminophen (Percocet) if needed, use Albuterol inhaler  if needed and bring to the hospital, Clonazepam (Klonopin), Gabapentin (Neurontin), Citalopram (Celexa), Omeprazole (Prilosec) with sip water Diabetic medication -----n/a Patient Special Instructions ----- Pre-Op special Istructions ----- Patient's wife-Christopher [name] verbalized understanding of instructions that were given at this phone interview. Patient's wife- Christopher [name] states that patient  denies shortness of breath, chest pain, fever, cough at this phone interview.   Anesthesia Review: Chart and medical history  to be reviewed by Jodell Cipro, PA. Addendum-per Gayla Medicus- looks OK  PCP:Dr. Jacquiline Doe Plastic surgeon- Dr. Marga Hoots Trauma surgeon-Dr. Violeta Gelinas  Infectious Disease-Dr. Daiva Eves- seeing patient for left buttock wound that will not heal due to osteomyelitis. Dressing changed daily. LOV-07/09/2019 epic  Has right arm PICC -1 port line for antibiotics. Has colostomy bag, suprapubic catheter and right side nephrostotmy tube. Patient uses power wheelchair and can stand up ,pivot  with 1 person assistance to stretcher..  Cardiologist :n/a Chest x-ray :05/30/2019 chart/epic; had trach and on ventilator from January ,2020 to March, 2020, trach removed 11/16/2018, per wife- Christopher [name], patient breathes well on own and uses Albuterol inhaler prn and Mucinex daily to help with secretions EKG :05/30/2019 chart/epic Echo :none Cardiac Cath : n/a Sleep Study/ CPAP :n/a Fasting Blood Sugar :  n/a    / Checks Blood Sugar --0 times a day:   Blood Thinner/ Instructions /Last Dose:n/a ASA / Instructions/ Last Dose :  n/a  Patient's wife, Christopher [name] patient  denies shortness of breath, chest pain, fever, and cough at this phone interview.  Patient will use Worker's Comp. Zenaida Niece to transport him from home to [facility] and wife-Christopher [name] will follow Zenaida Niece.Patient uses motorized wheelchair and stands up ,can pivot to stretcher  with 1 person assistance.
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
## 2019-07-09 NOTE — Telephone Encounter (Signed)
Per Md called [facility] with orders to pull picc on 2/1. Spoke with Melissa who was able to take verbal order. Lorenso Courier, [place]

## 2019-07-11 ENCOUNTER — Other Ambulatory Visit (HOSPITAL_COMMUNITY)
Admission: RE | Admit: 2019-07-11 | Discharge: 2019-07-11 | Disposition: A | Discharge status: Home / Self Care | Payer: No Typology Code available for payment source | Source: Ambulatory Visit | Attending: Urology | Admitting: Urology

## 2019-07-11 DIAGNOSIS — Z20822 Contact with and (suspected) exposure to covid-19: Secondary | ICD-10-CM | POA: Insufficient documentation

## 2019-07-11 DIAGNOSIS — Z01812 Encounter for preprocedural laboratory examination: Secondary | ICD-10-CM | POA: Insufficient documentation

## 2019-07-11 LAB — SARS CORONAVIRUS 2 (TAT 6-24 HRS): SARS Coronavirus 2: NEGATIVE

## 2019-07-12 ENCOUNTER — Telehealth: Payer: Self-pay | Admitting: Family Medicine

## 2019-07-12 NOTE — Telephone Encounter (Signed)
Please advise 

## 2019-07-12 NOTE — Telephone Encounter (Signed)
Starla from [facility] is calling in regards to the patient, calling about the patient's L big toe has a possible in grown toenail, she says it is painful to the touch, brown drainage, and is some redness/swelling. She asks if the patient could be prescribed some antibiotics for this.

## 2019-07-15 ENCOUNTER — Encounter (HOSPITAL_BASED_OUTPATIENT_CLINIC_OR_DEPARTMENT_OTHER)
Admission: RE | Disposition: A | Discharge status: Home / Self Care | Payer: Self-pay | Source: Home / Self Care | Attending: Urology

## 2019-07-15 ENCOUNTER — Encounter (HOSPITAL_BASED_OUTPATIENT_CLINIC_OR_DEPARTMENT_OTHER): Payer: Self-pay | Admitting: Urology

## 2019-07-15 ENCOUNTER — Ambulatory Visit (HOSPITAL_COMMUNITY): Payer: No Typology Code available for payment source

## 2019-07-15 ENCOUNTER — Ambulatory Visit (HOSPITAL_BASED_OUTPATIENT_CLINIC_OR_DEPARTMENT_OTHER): Payer: No Typology Code available for payment source | Admitting: Physician Assistant

## 2019-07-15 ENCOUNTER — Ambulatory Visit (HOSPITAL_BASED_OUTPATIENT_CLINIC_OR_DEPARTMENT_OTHER)
Admission: RE | Admit: 2019-07-15 | Discharge: 2019-07-15 | Disposition: A | Discharge status: Home / Self Care | Payer: No Typology Code available for payment source | Attending: Urology | Admitting: Urology

## 2019-07-15 DIAGNOSIS — N201 Calculus of ureter: Secondary | ICD-10-CM | POA: Insufficient documentation

## 2019-07-15 DIAGNOSIS — J9611 Chronic respiratory failure with hypoxia: Secondary | ICD-10-CM | POA: Diagnosis not present

## 2019-07-15 DIAGNOSIS — Z87442 Personal history of urinary calculi: Secondary | ICD-10-CM | POA: Diagnosis not present

## 2019-07-15 DIAGNOSIS — Z87891 Personal history of nicotine dependence: Secondary | ICD-10-CM | POA: Insufficient documentation

## 2019-07-15 DIAGNOSIS — K219 Gastro-esophageal reflux disease without esophagitis: Secondary | ICD-10-CM | POA: Insufficient documentation

## 2019-07-15 DIAGNOSIS — Z960 Presence of urogenital implants: Secondary | ICD-10-CM | POA: Diagnosis not present

## 2019-07-15 DIAGNOSIS — G8929 Other chronic pain: Secondary | ICD-10-CM | POA: Diagnosis not present

## 2019-07-15 HISTORY — PX: NEPHROLITHOTOMY: SHX5134

## 2019-07-15 HISTORY — PX: HOLMIUM LASER APPLICATION: SHX5852

## 2019-07-15 SURGERY — NEPHROLITHOTOMY PERCUTANEOUS
Anesthesia: General | Laterality: Right

## 2019-07-15 MED ORDER — FENTANYL CITRATE (PF) 100 MCG/2ML IJ SOLN
INTRAMUSCULAR | Status: AC
  Filled 2019-07-15: qty 2

## 2019-07-15 MED ORDER — FENTANYL CITRATE (PF) 100 MCG/2ML IJ SOLN
INTRAMUSCULAR | Status: DC | PRN
  Administered 2019-07-15 (×2): 50 ug via INTRAVENOUS

## 2019-07-15 MED ORDER — CEFTRIAXONE SODIUM 2 G IJ SOLR
INTRAMUSCULAR | Status: AC
  Filled 2019-07-15: qty 20

## 2019-07-15 MED ORDER — OXYCODONE HCL 5 MG PO TABS
ORAL_TABLET | ORAL | Status: AC
  Filled 2019-07-15: qty 1

## 2019-07-15 MED ORDER — ROCURONIUM BROMIDE 10 MG/ML (PF) SYRINGE
PREFILLED_SYRINGE | INTRAVENOUS | Status: DC | PRN
  Administered 2019-07-15: 50 mg via INTRAVENOUS

## 2019-07-15 MED ORDER — PROPOFOL 10 MG/ML IV BOLUS
INTRAVENOUS | Status: DC | PRN
  Administered 2019-07-15: 130 mg via INTRAVENOUS

## 2019-07-15 MED ORDER — ROCURONIUM BROMIDE 10 MG/ML (PF) SYRINGE
PREFILLED_SYRINGE | INTRAVENOUS | Status: AC
  Filled 2019-07-15: qty 10

## 2019-07-15 MED ORDER — LIDOCAINE 2% (20 MG/ML) 5 ML SYRINGE
INTRAMUSCULAR | Status: AC
  Filled 2019-07-15: qty 5

## 2019-07-15 MED ORDER — DEXAMETHASONE SODIUM PHOSPHATE 10 MG/ML IJ SOLN
INTRAMUSCULAR | Status: AC
  Filled 2019-07-15: qty 1

## 2019-07-15 MED ORDER — OXYCODONE HCL 5 MG PO TABS
5.0000 mg | ORAL_TABLET | Freq: Once | ORAL | Status: AC
  Administered 2019-07-15: 5 mg via ORAL
  Filled 2019-07-15: qty 1

## 2019-07-15 MED ORDER — SODIUM CHLORIDE 0.9 % IV SOLN
INTRAVENOUS | Status: DC
  Filled 2019-07-15: qty 1000

## 2019-07-15 MED ORDER — EPHEDRINE 5 MG/ML INJ
INTRAVENOUS | Status: AC
  Filled 2019-07-15: qty 10

## 2019-07-15 MED ORDER — SUGAMMADEX SODIUM 200 MG/2ML IV SOLN
INTRAVENOUS | Status: DC | PRN
  Administered 2019-07-15: 200 mg via INTRAVENOUS

## 2019-07-15 MED ORDER — PHENYLEPHRINE 40 MCG/ML (10ML) SYRINGE FOR IV PUSH (FOR BLOOD PRESSURE SUPPORT)
PREFILLED_SYRINGE | INTRAVENOUS | Status: AC
  Filled 2019-07-15: qty 10

## 2019-07-15 MED ORDER — DEXAMETHASONE SODIUM PHOSPHATE 10 MG/ML IJ SOLN
INTRAMUSCULAR | Status: DC | PRN
  Administered 2019-07-15: 5 mg via INTRAVENOUS

## 2019-07-15 MED ORDER — ACETAMINOPHEN 500 MG PO TABS
ORAL_TABLET | ORAL | Status: AC
  Filled 2019-07-15: qty 2

## 2019-07-15 MED ORDER — ONDANSETRON HCL 4 MG/2ML IJ SOLN
INTRAMUSCULAR | Status: DC | PRN
  Administered 2019-07-15: 4 mg via INTRAVENOUS

## 2019-07-15 MED ORDER — PROPOFOL 10 MG/ML IV BOLUS
INTRAVENOUS | Status: AC
  Filled 2019-07-15: qty 20

## 2019-07-15 MED ORDER — MIDAZOLAM HCL 2 MG/2ML IJ SOLN
INTRAMUSCULAR | Status: DC | PRN
  Administered 2019-07-15 (×2): 1 mg via INTRAVENOUS

## 2019-07-15 MED ORDER — FENTANYL CITRATE (PF) 100 MCG/2ML IJ SOLN
25.0000 ug | INTRAMUSCULAR | Status: DC | PRN
  Administered 2019-07-15 (×2): 50 ug via INTRAVENOUS
  Filled 2019-07-15: qty 1

## 2019-07-15 MED ORDER — ACETAMINOPHEN 500 MG PO TABS
1000.0000 mg | ORAL_TABLET | Freq: Once | ORAL | Status: AC
  Administered 2019-07-15: 1000 mg via ORAL
  Filled 2019-07-15: qty 2

## 2019-07-15 MED ORDER — ONDANSETRON HCL 4 MG/2ML IJ SOLN
INTRAMUSCULAR | Status: AC
  Filled 2019-07-15: qty 2

## 2019-07-15 MED ORDER — SODIUM CHLORIDE 0.9 % IV SOLN
2.0000 g | INTRAVENOUS | Status: AC
  Administered 2019-07-15: 13:00:00 2 g via INTRAVENOUS
  Filled 2019-07-15: qty 20

## 2019-07-15 MED ORDER — OXYCODONE-ACETAMINOPHEN 5-325 MG PO TABS: 1.0000 | ORAL_TABLET | ORAL | 0 refills | Status: DC | PRN

## 2019-07-15 MED ORDER — MIDAZOLAM HCL 2 MG/2ML IJ SOLN
INTRAMUSCULAR | Status: AC
  Filled 2019-07-15: qty 2

## 2019-07-15 MED ORDER — ONDANSETRON HCL 4 MG/2ML IJ SOLN
4.0000 mg | Freq: Once | INTRAMUSCULAR | Status: DC | PRN
  Filled 2019-07-15: qty 2

## 2019-07-15 MED ORDER — EPHEDRINE SULFATE-NACL 50-0.9 MG/10ML-% IV SOSY
PREFILLED_SYRINGE | INTRAVENOUS | Status: DC | PRN
  Administered 2019-07-15: 10 mg via INTRAVENOUS

## 2019-07-15 MED ORDER — SODIUM CHLORIDE 0.9 % IV SOLN
INTRAVENOUS | Status: AC
  Filled 2019-07-15: qty 100

## 2019-07-15 MED ORDER — PHENYLEPHRINE 40 MCG/ML (10ML) SYRINGE FOR IV PUSH (FOR BLOOD PRESSURE SUPPORT)
PREFILLED_SYRINGE | INTRAVENOUS | Status: DC | PRN
  Administered 2019-07-15: 80 ug via INTRAVENOUS
  Administered 2019-07-15: 120 ug via INTRAVENOUS
  Administered 2019-07-15: 80 ug via INTRAVENOUS

## 2019-07-15 MED ORDER — LIDOCAINE 2% (20 MG/ML) 5 ML SYRINGE
INTRAMUSCULAR | Status: DC | PRN
  Administered 2019-07-15: 60 mg via INTRAVENOUS

## 2019-07-15 SURGICAL SUPPLY — 67 items
APL PRP STRL LF DISP 70% ISPRP (MISCELLANEOUS)
BAG URINE DRAIN 2000ML AR STRL (UROLOGICAL SUPPLIES) ×6 IMPLANT
BASKET STONE 1.7 NGAGE (UROLOGICAL SUPPLIES) ×3 IMPLANT
BASKET STONE NITINOL 3FRX115MB (UROLOGICAL SUPPLIES) IMPLANT
BASKET ZERO TIP NITINOL 2.4FR (BASKET) IMPLANT
BENZOIN TINCTURE PRP APPL 2/3 (GAUZE/BANDAGES/DRESSINGS) ×3 IMPLANT
BLADE SURG 15 STRL LF DISP TIS (BLADE) ×1 IMPLANT
BLADE SURG 15 STRL SS (BLADE) ×2
BSKT STON RTRVL ZERO TP 2.4FR (BASKET)
CATH FOLEY 2W COUNCIL 20FR 5CC (CATHETERS) IMPLANT
CATH FOLEY 2WAY SLVR  5CC 18FR (CATHETERS) ×2
CATH FOLEY 2WAY SLVR 5CC 18FR (CATHETERS) ×1 IMPLANT
CATH IMAGER II 65CM (CATHETERS) ×3 IMPLANT
CATH INTERMIT  6FR 70CM (CATHETERS) IMPLANT
CATH ROBINSON RED A/P 20FR (CATHETERS) IMPLANT
CATH URET DUAL LUMEN 6-10FR 50 (CATHETERS) IMPLANT
CATH X-FORCE N30 NEPHROSTOMY (TUBING) ×3 IMPLANT
CHLORAPREP W/TINT 26 (MISCELLANEOUS) IMPLANT
COVER SURGICAL LIGHT HANDLE (MISCELLANEOUS) ×3 IMPLANT
COVER WAND RF STERILE (DRAPES) IMPLANT
DRAPE C-ARM 42X120 X-RAY (DRAPES) ×3 IMPLANT
DRAPE LINGEMAN PERC (DRAPES) ×3 IMPLANT
DRAPE SHEET LG 3/4 BI-LAMINATE (DRAPES) IMPLANT
DRAPE SURG IRRIG POUCH 19X23 (DRAPES) ×3 IMPLANT
DRSG PAD ABDOMINAL 8X10 ST (GAUZE/BANDAGES/DRESSINGS) ×6 IMPLANT
DRSG TEGADERM 8X12 (GAUZE/BANDAGES/DRESSINGS) ×3 IMPLANT
EVACUATOR MICROVAS BLADDER (UROLOGICAL SUPPLIES) IMPLANT
FIBER LASER FLEXIVA 1000 (UROLOGICAL SUPPLIES) IMPLANT
FIBER LASER FLEXIVA 365 (UROLOGICAL SUPPLIES) IMPLANT
FIBER LASER FLEXIVA 550 (UROLOGICAL SUPPLIES) IMPLANT
FIBER LASER TRAC TIP (UROLOGICAL SUPPLIES) IMPLANT
GAUZE SPONGE 4X4 12PLY STRL (GAUZE/BANDAGES/DRESSINGS) ×3 IMPLANT
GAUZE SPONGE 4X4 12PLY STRL LF (GAUZE/BANDAGES/DRESSINGS) ×3 IMPLANT
GLOVE BIO SURGEON STRL SZ8 (GLOVE) ×3 IMPLANT
GLOVE BIOGEL PI IND STRL 8 (GLOVE) ×1 IMPLANT
GLOVE BIOGEL PI INDICATOR 8 (GLOVE) ×2
GOWN STRL REUS W/TWL LRG LVL3 (GOWN DISPOSABLE) ×3 IMPLANT
GOWN STRL REUS W/TWL XL LVL3 (GOWN DISPOSABLE) ×3 IMPLANT
GUIDEWIRE AMPLAZ .035X145 (WIRE) ×6 IMPLANT
GUIDEWIRE STR DUAL SENSOR (WIRE) ×3 IMPLANT
IV SET EXTENSION CATH 6 NF (IV SETS) IMPLANT
KIT BASIN OR (CUSTOM PROCEDURE TRAY) ×3 IMPLANT
KIT PROBE 340X3.4XDISP GRN (MISCELLANEOUS) IMPLANT
KIT PROBE TRILOGY 3.4X340 (MISCELLANEOUS)
KIT PROBE TRILOGY 3.9X350 (MISCELLANEOUS) IMPLANT
KIT TURNOVER CYSTO (KITS) IMPLANT
MANIFOLD NEPTUNE II (INSTRUMENTS) ×3 IMPLANT
NEEDLE TROCAR 18X15 ECHO (NEEDLE) IMPLANT
NEEDLE TROCAR 18X20 (NEEDLE) IMPLANT
NS IRRIG 1000ML POUR BTL (IV SOLUTION) ×3 IMPLANT
PACK CYSTO (CUSTOM PROCEDURE TRAY) ×3 IMPLANT
SET AMPLATZ RENAL DILATOR (MISCELLANEOUS) IMPLANT
SHEATH PEELAWAY SET 9 (SHEATH) IMPLANT
SPONGE LAP 4X18 RFD (DISPOSABLE) ×3 IMPLANT
STENT URET 6FRX26 CONTOUR (STENTS) IMPLANT
SUT SILK 2 0 30  PSL (SUTURE) ×2
SUT SILK 2 0 30 PSL (SUTURE) ×1 IMPLANT
SYR 10ML LL (SYRINGE) ×3 IMPLANT
SYR 20ML LL LF (SYRINGE) ×6 IMPLANT
SYR 50ML LL SCALE MARK (SYRINGE) IMPLANT
TOWEL OR 17X26 10 PK STRL BLUE (TOWEL DISPOSABLE) ×3 IMPLANT
TRAY FOLEY MTR SLVR 16FR STAT (SET/KITS/TRAYS/PACK) ×3 IMPLANT
TUBING CONNECTING 10 (TUBING) ×4 IMPLANT
TUBING CONNECTING 10' (TUBING) ×2
TUBING STONE CATCHER TRILOGY (MISCELLANEOUS) IMPLANT
TUBING UROLOGY SET (TUBING) ×3 IMPLANT
WATER STERILE IRR 1000ML POUR (IV SOLUTION) ×3 IMPLANT

## 2019-07-15 NOTE — Telephone Encounter (Signed)
Please ask patient to schedule visit.  Christopher [name]. [name], MD 07/15/2019 8:01 AM

## 2019-07-15 NOTE — Anesthesia Procedure Notes (Signed)
Procedure Name: Intubation Date/Time: 07/15/2019 1:08 PM Performed by: Mechele Claude, CRNA Pre-anesthesia Checklist: Patient identified, Emergency Drugs available, Suction available and Patient being monitored Patient Re-evaluated:Patient Re-evaluated prior to induction Oxygen Delivery Method: Circle system utilized Preoxygenation: Pre-oxygenation with 100% oxygen Induction Type: IV induction Ventilation: Mask ventilation without difficulty Laryngoscope Size: Mac and 3 Grade View: Grade II Tube type: Oral Tube size: 7.0 mm Number of attempts: 1 Airway Equipment and Method: Stylet Placement Confirmation: ETT inserted through vocal cords under direct vision,  positive ETCO2 and breath sounds checked- equal and bilateral Secured at: 23 cm Tube secured with: Tape Dental Injury: Teeth and Oropharynx as per pre-operative assessment

## 2019-07-15 NOTE — Discharge Instructions (Signed)
Ureteroscopy Ureteroscopy is a procedure to check for and treat problems inside part of the urinary tract. In this procedure, a thin, tube-shaped instrument with a light at the end (ureteroscope) is used to look at the inside of the kidneys and the ureters, which are the tubes that carry urine from the kidneys to the bladder. The ureteroscope is inserted into one or both of the ureters. You may need this procedure if you have frequent urinary tract infections (UTIs), blood in your urine, or a stone in one of your ureters. A ureteroscopy can be done to find the cause of urine blockage in a ureter and to evaluate other abnormalities inside the ureters or kidneys. If stones are found, they can be removed during the procedure. Polyps, abnormal tissue, and some types of tumors can also be removed or treated. The ureteroscope may also have a tool to remove tissue to be checked for disease under a microscope (biopsy). Tell a health care provider about:  Any allergies you have.  All medicines you are taking, including vitamins, herbs, eye drops, creams, and over-the-counter medicines.  Any problems you or family members have had with anesthetic medicines.  Any blood disorders you have.  Any surgeries you have had.  Any medical conditions you have.  Whether you are pregnant or may be pregnant. What are the risks? Generally, this is a safe procedure. However, problems may occur, including:  Bleeding.  Infection.  Allergic reactions to medicines.  Scarring that narrows the ureter (stricture).  Creating a hole in the ureter (perforation). What happens before the procedure? Staying hydrated Follow instructions from your health care provider about hydration, which may include:  Up to 2 hours before the procedure - you may continue to drink clear liquids, such as water, clear fruit juice, black coffee, and plain tea. Eating and drinking restrictions Follow instructions from your health care  provider about eating and drinking, which may include:  8 hours before the procedure - stop eating heavy meals or foods such as meat, fried foods, or fatty foods.  6 hours before the procedure - stop eating light meals or foods, such as toast or cereal.  6 hours before the procedure - stop drinking milk or drinks that contain milk.  2 hours before the procedure - stop drinking clear liquids. Medicines  Ask your health care provider about: ? Changing or stopping your regular medicines. This is especially important if you are taking diabetes medicines or blood thinners. ? Taking medicines such as aspirin and ibuprofen. These medicines can thin your blood. Do not take these medicines before your procedure if your health care provider instructs you not to.  You may be given antibiotic medicine to help prevent infection. General instructions  You may have a urine sample taken to check for infection.  Plan to have someone take you home from the hospital or clinic. What happens during the procedure?   To reduce your risk of infection: ? Your health care team will wash or sanitize their hands. ? Your skin will be washed with soap.  An IV tube will be inserted into one of your veins.  You will be given one of the following: ? A medicine to help you relax (sedative). ? A medicine to make you fall asleep (general anesthetic). ? A medicine that is injected into your spine to numb the area below and slightly above the injection site (spinal anesthetic).  To lower your risk of infection, you may be given an antibiotic medicine   by an injection or through the IV tube.  The opening from which you urinate (urethra) will be cleaned with a germ-killing solution.  The ureteroscope will be passed through your urethra into your bladder.  A salt-water solution will flow through the ureteroscope to fill your bladder. This will help the health care provider see the openings of your ureters more  clearly.  Then, the ureteroscope will be passed into your ureter. ? If a growth is found, a piece of it may be removed so it can be examined under a microscope (biopsy). ? If a stone is found, it may be removed through the ureteroscope, or the stone may be broken up using a laser, shock waves, or electrical energy. ? In some cases, if the ureter is too small, a tube may be inserted that keeps the ureter open (ureteral stent). The stent may be left in place for 1 or 2 weeks to keep the ureter open, and then the ureteroscopy procedure will be performed.  The scope will be removed, and your bladder will be emptied. The procedure may vary among health care providers and hospitals. What happens after the procedure?  Your blood pressure, heart rate, breathing rate, and blood oxygen level will be monitored until the medicines you were given have worn off.  You may be asked to urinate.  Donot drive for 24 hours if you were given a sedative. This information is not intended to replace advice given to you by your health care provider. Make sure you discuss any questions you have with your health care provider. Document Revised: 05/12/2017 Document Reviewed: 03/11/2016 Elsevier Patient Education  2020 [employer]  Post Anesthesia Home Care Instructions  Activity: Get plenty of rest for the remainder of the day. A responsible adult should stay with you for 24 hours following the procedure.  For the next 24 hours, DO NOT: -Drive a car -Operate machinery -Drink alcoholic beverages -Take any medication unless instructed by your physician -Make any legal decisions or sign important papers.  Meals: Start with liquid foods such as gelatin or soup. Progress to regular foods as tolerated. Avoid greasy, spicy, heavy foods. If nausea and/or vomiting occur, drink only clear liquids until the nausea and/or vomiting subsides. Call your physician if vomiting continues.  Special  Instructions/Symptoms: Your throat may feel dry or sore from the anesthesia or the breathing tube placed in your throat during surgery. If this causes discomfort, gargle with warm salt water. The discomfort should disappear within 24 hours.  If you had a scopolamine patch placed behind your ear for the management of post- operative nausea and/or vomiting:  1. The medication in the patch is effective for 72 hours, after which it should be removed.  Wrap patch in a tissue and discard in the trash. Wash hands thoroughly with soap and water. 2. You may remove the patch earlier than 72 hours if you experience unpleasant side effects which may include dry mouth, dizziness or visual disturbances. 3. Avoid touching the patch. Wash your hands with soap and water after contact with the patch.    

## 2019-07-15 NOTE — Op Note (Signed)
Preoperative diagnosis: Right ureteral calculus  Postoperative diagnosis: Same  Procedure 1.  Right percutaneous nephrostolithotomy for stone less than 2 cm 2.  Right nephrostogram 3.  Intraoperative fluoroscopy, under 1 hour, with interpretation  Attending: Wilkie Aye, MD  Anesthesia: General  Estimated blood loss: Minimal  Antibiotics: ancef  Drains: 1.  20 French SP catheter  Specimens: Stone for analysis  Findings: 54mm proximal ureteral calculus  Indications: Patient is a 54 year old male with a history of a rigth ureteral calculus.   After discussing treatment options and decided she was right percutaneous nephrostolithotomy.  Patient already has a nephrostomy tube.  Procedure in detail: Prior to procedure consent was obtained.  Patient was brought to the operating room debridement was done to ensure correct patient, correct procedure, and correct site.  General anesthesia was administered. The patient was then placed in the prone position.  His nephrostomy tube and right flank was then prepped and draped in usual sterile fashion.  A nephrostogram was obtained and findings noted above.  We used the flexible ureteroscope through the nephrostomy tube tract to perform antrgrade ureteroscopy. We encountered a calculus int he proximal ureter and using a 200nm laser fiber the stone was fragmented. The fragments were then removed with an NGage basket.  We then used a lithoclast to fragment the stone in multiple pieces.  We then performed ureteroscopy to the bladder and noted no additional calculi. We elected to not leave a stent or nephrostomy tube since this was an uncomplicated antegrade ureteroscopy.    Dressing was placed over the nephrostomy tube site and this then concluded the procedure was well-tolerated by the patient.  Complications: None  Condition: Stable, extubated, transferred to PACU  Plan: Patient is to be discharged home and followup in 1 week

## 2019-07-15 NOTE — Anesthesia Postprocedure Evaluation (Signed)
Anesthesia Post Note  Patient: Christopher [name]  Procedure(s) Performed: NEPHROLITHOTOMY PERCUTANEOUS (Right ) HOLMIUM LASER APPLICATION (Right )     Patient location during evaluation: PACU Anesthesia Type: General Level of consciousness: awake and alert Pain management: pain level controlled Vital Signs Assessment: post-procedure vital signs reviewed and stable Respiratory status: spontaneous breathing, nonlabored ventilation and respiratory function stable Cardiovascular status: blood pressure returned to baseline and stable Postop Assessment: no apparent nausea or vomiting Anesthetic complications: no    Last Vitals:  Vitals:   07/15/19 1530 07/15/19 1554  BP: 131/86 136/85  Pulse: 80 76  Resp: 13 16  Temp:  36.6 C  SpO2: 92% 96%    Last Pain:  Vitals:   07/15/19 1554  TempSrc:   PainSc: 9                  Cecile Hearing

## 2019-07-15 NOTE — Telephone Encounter (Signed)
Patient is on his way to the hospital for surgery for Kidney stones.

## 2019-07-15 NOTE — Progress Notes (Signed)
IV team called to come de access PICC line.

## 2019-07-15 NOTE — Transfer of Care (Signed)
   Last Vitals:  Vitals Value Taken Time  BP 142/96 07/15/19 1423  Temp    Pulse 80 07/15/19 1425  Resp 13 07/15/19 1425  SpO2 100 % 07/15/19 1425  Vitals shown include unvalidated device data.  Last Pain:  Vitals:   07/15/19 1124  TempSrc: Oral  PainSc: 0-No pain  Immediate Anesthesia Transfer of Care Note  Patient: Christopher [name]  Procedure(s) Performed: Procedure(s) (LRB): NEPHROLITHOTOMY PERCUTANEOUS (Right) HOLMIUM LASER APPLICATION (Right)  Patient Location: PACU  Anesthesia Type: General  Level of Consciousness: awake, alert  and oriented  Airway & Oxygen Therapy: Patient Spontanous Breathing and Patient connected to nasal cannula oxygen  Post-op Assessment: Report given to PACU RN and Post -op Vital signs reviewed and stable  Post vital signs: Reviewed and stable  Complications: No apparent anesthesia complications

## 2019-07-15 NOTE — H&P (Signed)
Urology Admission H&P  Chief Complaint: right flank pain  History of Present Illness: Christopher [name] is a 54yo with a hx of nephrolithiasis who presented tot eh ER 1 month ago with severe rigth flank pain. He has a 4-63mm right ureteral calculus and underwent right nephrostomy tube placement. He did not pass the calculus on repeat CT imaging.   Past Medical History:  Diagnosis Date  . Acute on chronic respiratory failure with hypoxia (HCC) 06/2018   trach removed 11-16-2018, on vent from jan until may 2020  . Bacteremia due to Pseudomonas 06/2018  . Chronic pain syndrome   . History of kidney stones   . Multiple traumatic injuries   . Wound discharge    left hip wound with bloody/clear drainage change dressing q day surgilube with gauze, between legs wound using calcium algenate pad bid   Past Surgical History:  Procedure Laterality Date  . APPLICATION OF A-CELL OF BACK N/A 08/06/2018   Procedure: Application Of A-Cell Of Back;  Surgeon: Peggye Form, DO;  Location: [facility] OR;  Service: Plastics;  Laterality: N/A;  . APPLICATION OF A-CELL OF EXTREMITY Left 08/06/2018   Procedure: Application Of A-Cell Of Extremity;  Surgeon: Peggye Form, DO;  Location: [facility] OR;  Service: Plastics;  Laterality: Left;  . APPLICATION OF WOUND VAC  07/12/2018   Procedure: Application Of Wound Vac to the Left Thigh and Scrotum.;  Surgeon: Roby Lofts, MD;  Location: [facility] OR;  Service: Orthopedics;;  . APPLICATION OF WOUND VAC  07/10/2018   Procedure: Application Of Wound Vac;  Surgeon: Berna Bue, MD;  Location: [facility] OR;  Service: General;;  . COLOSTOMY N/A 07/23/2018   Procedure: COLOSTOMY;  Surgeon: Violeta Gelinas, MD;  Location: [facility] OR;  Service: General;  Laterality: N/A;  . CYSTOSCOPY W/ URETERAL STENT PLACEMENT N/A 07/15/2018   Procedure: RETROGRADE URETHROGRAM;  Surgeon: Marcine Matar, MD;  Location: [facility] OR;  Service: Urology;  Laterality: N/A;  . CYSTOSCOPY WITH LITHOLAPAXY N/A 05/06/2019    Procedure: CYSTOSCOPY BASKET BLADDER STONE EXTRACTION;  Surgeon: Malen Gauze, MD;  Location: [facility];  Service: Urology;  Laterality: N/A;  30 MINS  . CYSTOSTOMY N/A 05/06/2019   Procedure: REPLACEMENT OF SUPRAPUBIC CATHETER;  Surgeon: Malen Gauze, MD;  Location: [facility];  Service: Urology;  Laterality: N/A;  . DEBRIDEMENT AND CLOSURE WOUND Left 03/04/2019   Procedure: Excision of hip wound with placement of Acell;  Surgeon: Peggye Form, DO;  [facility]: [facility];  Service: Plastics;  Laterality: Left;  . ESOPHAGOGASTRODUODENOSCOPY N/A 08/14/2018   Procedure: ESOPHAGOGASTRODUODENOSCOPY (EGD);  Surgeon: Violeta Gelinas, MD;  Location: [facility] ENDOSCOPY;  Service: General;  Laterality: N/A;  bedside  . FACIAL RECONSTRUCTION SURGERY     X 2--once as a teenager and second time in his 64's  . HARDWARE REMOVAL Left 03/04/2019   Procedure: Left Hip Hardware Removal;  Surgeon: Roby Lofts, MD;  Location: [facility];  Service: Orthopedics;  Laterality: Left;  . HIP PINNING,CANNULATED Left 07/12/2018   Procedure: CANNULATED HIP PINNING;  Surgeon: Roby Lofts, MD;  Location: [facility] OR;  Service: Orthopedics;  Laterality: Left;  . HIP SURGERY    . I & D EXTREMITY Left 07/25/2018   Procedure: Debridement of buttock, scrotum and left leg, placement of acell and vac;  Surgeon: Peggye Form, DO;  Location: [facility];  Service: Plastics;  Laterality: Left;  . I & D EXTREMITY N/A 08/06/2018   Procedure: Debridement of buttock,  scrotum and left leg;  Surgeon: Peggye Form, DO;  [facility]: [facility];  Service: Plastics;  Laterality: N/A;  . I & D EXTREMITY N/A 08/13/2018   Procedure: Debridement of buttock, scrotum and left leg, placement of acell and vac;  Surgeon: Peggye Form, DO;  Location: [facility];  Service: Plastics;  Laterality: N/A;  90 min, please  . INCISION AND DRAINAGE OF WOUND N/A 07/18/2018   Procedure: Debridement of left leg, buttocks and  scrotal wound with placement of acell and Flexiseal;  Surgeon: Peggye Form, DO;  Location: [facility];  Service: Plastics;  Laterality: N/A;  . INCISION AND DRAINAGE OF WOUND Left 08/29/2018   Procedure: Debridement of buttock, scrotum and left leg, placement of acell and vac;  Surgeon: Peggye Form, DO;  Location: [facility];  Service: Plastics;  Laterality: Left;  75 min, please  . INCISION AND DRAINAGE OF WOUND Bilateral 10/23/2018   Procedure: DEBRIDEMENT OF BUTTOCK,SCROTUM, AND LEG WOUNDS WITH PLACEMENT OF ACELL- BILATERAL 90 MIN;  Surgeon: Peggye Form, DO;  Location: [facility] OR;  Service: Plastics;  Laterality: Bilateral;  . IR ANGIOGRAM PELVIS SELECTIVE OR SUPRASELECTIVE  07/10/2018  . IR ANGIOGRAM PELVIS SELECTIVE OR SUPRASELECTIVE  07/10/2018  . IR ANGIOGRAM SELECTIVE EACH ADDITIONAL VESSEL  07/10/2018  . IR EMBO ART  VEN HEMORR LYMPH EXTRAV  INC GUIDE ROADMAPPING  07/10/2018  . IR NEPHROSTOMY PLACEMENT LEFT  04/05/2019  . IR NEPHROSTOMY PLACEMENT RIGHT  05/31/2019  . IR US GUIDE BX ASP/DRAIN  07/10/2018  . IR US GUIDE VASC ACCESS RIGHT  07/10/2018  . IR VENO/EXT/UNI LEFT  07/10/2018  . IRRIGATION AND DEBRIDEMENT OF WOUND WITH SPLIT THICKNESS SKIN GRAFT Left 09/19/2018   Procedure: Debridement of gluteal wound with placement of acell to left leg.;  Surgeon: Peggye Form, DO;  Location: [facility];  Service: Plastics;  Laterality: Left;  2.5 hours, please  . LAPAROTOMY N/A 07/12/2018   Procedure: EXPLORATORY LAPAROTOMY;  Surgeon: Violeta Gelinas, MD;  Location: [facility] OR;  Service: General;  Laterality: N/A;  . LAPAROTOMY N/A 07/15/2018   Procedure: WOUND EXPLORATION; CLOSURE OF ABDOMEN;  Surgeon: Violeta Gelinas, MD;  Location: [facility] OR;  Service: General;  Laterality: N/A;  . LAPAROTOMY  07/10/2018   Procedure: Exploratory Laparotomy;  Surgeon: Berna Bue, MD;  Location: [facility] OR;  Service: General;;  . PEG PLACEMENT N/A 08/14/2018   Procedure: PERCUTANEOUS ENDOSCOPIC GASTROSTOMY (PEG)  PLACEMENT;  Surgeon: Violeta Gelinas, MD;  Location: [facility] ENDOSCOPY;  Service: General;  Laterality: N/A;  . PERCUTANEOUS TRACHEOSTOMY N/A 08/02/2018   Procedure: PERCUTANEOUS TRACHEOSTOMY;  Surgeon: Violeta Gelinas, MD;  Location: [facility] OR;  Service: General;  Laterality: N/A;  . RADIOLOGY WITH ANESTHESIA N/A 07/10/2018   Procedure: IR WITH ANESTHESIA;  Surgeon: Simonne Come, MD;  Location: [facility] OR;  Service: Radiology;  Laterality: N/A;  . RADIOLOGY WITH ANESTHESIA Right 07/10/2018   Procedure: Ir With Anesthesia;  Surgeon: Simonne Come, MD;  Location: [facility] OR;  Service: Radiology;  Laterality: Right;  . SCROTAL EXPLORATION N/A 07/15/2018   Procedure: SCROTUM DEBRIDEMENT;  Surgeon: Marcine Matar, MD;  Location: [facility] OR;  Service: Urology;  Laterality: N/A;  . SHOULDER SURGERY    . SKIN SPLIT GRAFT Right 09/19/2018   Procedure: Skin Graft Split Thickness;  Surgeon: Peggye Form, DO;  Location: [facility] OR;  Service: Plastics;  Laterality: Right;  . SKIN SPLIT GRAFT N/A 10/03/2018   Procedure: Split thickness skin graft to gluteal area with acell placement;  Surgeon: Ulice Bold,  Loel Lofty, DO;  Location: [place];  Service: Plastics;  Laterality: N/A;  3 hours, please  . VACUUM ASSISTED CLOSURE CHANGE N/A 07/12/2018   Procedure: ABDOMINAL VACUUM ASSISTED CLOSURE CHANGE and abdominal washout;  Surgeon: Georganna Skeans, MD;  Location: [place];  Service: General;  Laterality: N/A;  . WOUND DEBRIDEMENT Left 07/23/2018   Procedure: DEBRIDEMENT LEFT BUTTOCK  WOUND;  Surgeon: Georganna Skeans, MD;  Location: [place];  Service: General;  Laterality: Left;  . WOUND EXPLORATION Left 07/10/2018   Procedure: WOUND EXPLORATION LEFT GROIN;  Surgeon: Clovis Riley, MD;  Location: [facility] OR;  Service: General;  Laterality: Left;    Home Medications:  Current Facility-Administered Medications  Medication Dose Route Frequency Provider Last Rate Last Admin  . 0.9 %  sodium chloride infusion   Intravenous Continuous Cleon Gustin,  MD 50 mL/hr at 07/15/19 1142 New Bag at 07/15/19 1142  . cefTRIAXone (ROCEPHIN) 2 g in sodium chloride 0.9 % 100 mL IVPB  2 g Intravenous 30 min Pre-Op Dyer Klug, Candee Furbish, MD       Allergies: No Known Allergies  Family History  Problem Relation Age of Onset  . Breast cancer Mother        with mets to the bones   Social History:  reports that he quit smoking about a year ago. His smoking use included cigarettes. He has a 20.00 pack-year smoking history. He has never used smokeless tobacco. He reports that he does not drink alcohol or use drugs.  Review of Systems  Genitourinary: Positive for flank pain.  All other systems reviewed and are negative.   Physical Exam:  Vital signs in last 24 hours: Temp:  [97 F (36.1 C)] 97 F (36.1 C) (02/01 1124) Pulse Rate:  [80] 80 (02/01 1124) Resp:  [14] 14 (02/01 1124) BP: (126)/(91) 126/91 (02/01 1124) SpO2:  [98 %] 98 % (02/01 1124) Weight:  [64 kg] 64 kg (02/01 1124) Physical Exam  Constitutional: He is oriented to person, place, and time. He appears well-developed and well-nourished.  HENT:  Head: Normocephalic and atraumatic.  Eyes: Pupils are equal, round, and reactive to light. EOM are normal.  Neck: No thyromegaly present.  Cardiovascular: Normal rate and regular rhythm.  Respiratory: Effort normal. No respiratory distress.  GI: Soft. He exhibits no distension.  Musculoskeletal:        General: Deformity present. No edema.     Cervical back: Normal range of motion.  Neurological: He is alert and oriented to person, place, and time.  Skin: Skin is warm and dry.  Psychiatric: He has a normal mood and affect. His behavior is normal. Judgment and thought content normal.    Laboratory Data:  No results found for this or any previous visit (from the past 24 hour(s)). Recent Results (from the past 240 hour(s))  SARS CORONAVIRUS 2 (TAT 6-24 HRS) Nasopharyngeal Nasopharyngeal Swab     Status: None   Collection Time: 07/11/19  2:45  PM   Specimen: Nasopharyngeal Swab  Result Value Ref Range Status   SARS Coronavirus 2 NEGATIVE NEGATIVE Final    Comment: (NOTE) SARS-CoV-2 target nucleic acids are NOT DETECTED. The SARS-CoV-2 RNA is generally detectable in upper and lower respiratory specimens during the acute phase of infection. Negative results do not preclude SARS-CoV-2 infection, do not rule out co-infections with other pathogens, and should not be used as the sole basis for treatment or other patient management decisions. Negative results must be combined with clinical observations, patient history,  and epidemiological information. The expected result is Negative. Fact Sheet for Patients: HairSlick.no Fact Sheet for Healthcare Providers: quierodirigir.com This test is not yet approved or cleared by the [place] FDA and  has been authorized for detection and/or diagnosis of SARS-CoV-2 by FDA under an Emergency Use Authorization (EUA). This EUA will remain  in effect (meaning this test can be used) for the duration of the COVID-19 declaration under Section 56 4(b)(1) of the Act, 21 U.S.C. section 360bbb-3(b)(1), unless the authorization is terminated or revoked sooner. Performed at [facility], [address] [place], [place] [address]    Creatinine: No results for input(s): CREATININE in the last 168 hours. Baseline Creatinine: unknown  Impression/Assessment:  53yo with right ureteral calculus  Plan:  The risks/benefits/alternatives to right antegrade ureteroscopic stone extraction was explained to the patient and he understands and wishes to proceed with surgery  Wilkie Aye 07/15/2019, 12:44 PM

## 2019-07-15 NOTE — Anesthesia Preprocedure Evaluation (Signed)
Anesthesia Evaluation  Patient identified by MRN, date of birth, ID band Patient awake    Reviewed: Allergy & Precautions, NPO status , Patient's Chart, lab work & pertinent test results  Airway Mallampati: II  TM Distance: >3 FB Neck ROM: Full    Dental  (+) Poor Dentition, Dental Advisory Given, Chipped,    Pulmonary former smoker,  Acute on chronic respiratory failure with hypoxia: trach removed 11/16/2018   breath sounds clear to auscultation       Cardiovascular negative cardio ROS   Rhythm:Regular Rate:Normal     Neuro/Psych PSYCHIATRIC DISORDERS Anxiety Depression negative neurological ROS     GI/Hepatic Neg liver ROS, GERD  Medicated,  Endo/Other  negative endocrine ROS  Renal/GU Renal disease (RIGHT URETERAL CALCULUS)     Musculoskeletal negative musculoskeletal ROS (+)   Abdominal   Peds  Hematology negative hematology ROS (+) Blood dyscrasia, anemia ,   Anesthesia Other Findings Day of surgery medications reviewed with the patient.  Reproductive/Obstetrics                             Anesthesia Physical  Anesthesia Plan  ASA: III  Anesthesia Plan: General   Post-op Pain Management:    Induction: Intravenous  PONV Risk Score and Plan: 3 and Ondansetron, Dexamethasone and Midazolam  Airway Management Planned: Oral ETT  Additional Equipment: None  Intra-op Plan:   Post-operative Plan: Extubation in OR  Informed Consent: I have reviewed the patients History and Physical, chart, labs and discussed the procedure including the risks, benefits and alternatives for the proposed anesthesia with the patient or authorized representative who has indicated his/her understanding and acceptance.     Dental advisory given  Plan Discussed with: CRNA  Anesthesia Plan Comments:         Anesthesia Quick Evaluation

## 2019-07-15 NOTE — Progress Notes (Signed)
Ok to use PICC line per Dr Desmond Lope for surgery no need for new IV

## 2019-07-16 ENCOUNTER — Telehealth: Payer: Self-pay | Admitting: Surgical

## 2019-07-16 NOTE — Telephone Encounter (Signed)

## 2019-07-17 ENCOUNTER — Other Ambulatory Visit: Payer: Self-pay

## 2019-07-17 ENCOUNTER — Ambulatory Visit (INDEPENDENT_AMBULATORY_CARE_PROVIDER_SITE_OTHER): Payer: No Typology Code available for payment source | Admitting: Surgical

## 2019-07-17 ENCOUNTER — Encounter: Payer: Self-pay | Admitting: Surgical

## 2019-07-17 ENCOUNTER — Telehealth: Payer: Self-pay

## 2019-07-17 VITALS — BP 117/74 | HR 71 | Temp 98.4°F | Wt 141.0 lb

## 2019-07-17 DIAGNOSIS — S31829D Unspecified open wound of left buttock, subsequent encounter: Secondary | ICD-10-CM

## 2019-07-17 DIAGNOSIS — S31000D Unspecified open wound of lower back and pelvis without penetration into retroperitoneum, subsequent encounter: Secondary | ICD-10-CM | POA: Diagnosis not present

## 2019-07-17 NOTE — Progress Notes (Signed)
Subjective:     Patient ID: Christopher [name], male    DOB: 1965-06-24, 54 y.o.   MRN: [id number]  Chief Complaint  Patient presents with  . Follow-up    HPI: The patient is a 54 y.o. male here for follow-up in regards to left hip wound, sacral wound, chronic osteomyelitis of L hip.  He is here with his case manager and wife, Christopher [name]  He was last seen on 06/21/2019 in this office and was scheduled for imaging with radiology for possible percutaneous drain placement.  He underwent ultrasound-guided aspiration of left flank subcutaneous tissue on 07/04/2019, no discrete pocket of fluid was found but they were able to aspirate approximately 1 cc of fluid directly from the pelvic bone surface.  Culture negative.  He recently saw infectious disease, Dr. Drucilla [name] on 07/09/2019 and it was decided to start patient on oral antibiotics after finishing his second round of parenteral antibiotics on July 15, 2019.  He is currently taking cefdinir 300 mg twice daily and metronidazole 500 mg 3 times daily.  They reported they had a visit with Dr. Doreatha [name], Ortho yesterday and had talked about possibility of removing left hip hardware and replacing due to chronic osteomyelitis.   Christopher [name] and his wife have been applying collagen to sacral wound that developed while hospitalized late December 2020, this is improving, today it is approximately 1 x 0.5 cm and nearly flush with the surrounding epithelium.  Good granulation tissue.  Does not appear infected.  No purulent drainage.  The left hip has had minimal improvement, there is still a small opening and likely sinus tract with drainage that is milky yellow today on evaluation.  It does not appear larger or overtly infected on the outside.  He reports that he has been doing well with physical therapy and questions whether he would like to go forward with another surgery such as the removal of left hip and pelvic hardware and replacement due to the setback  that may cause him.  He reports that he has been eating well and slowly gaining back his strength.   Review of Systems  Constitutional: Positive for activity change. Negative for chills and fever.  Respiratory: Negative.   Cardiovascular: Negative.   Musculoskeletal: Positive for arthralgias.  Skin: Positive for color change and wound.  Neurological: Positive for weakness.     Objective:   Vital Signs BP 117/74 (BP Location: Right Arm, Patient Position: Sitting, Cuff Size: Normal)   Pulse 71   Temp 98.4 F (36.9 C) (Temporal)   Wt 141 lb (64 kg)   SpO2 96%   BMI 17.62 kg/m  Vital Signs and Nursing Note Reviewed Physical Exam  Constitutional: He is oriented to person, place, and time.  Thin white male  Cardiovascular: Normal rate.  Pulmonary/Chest: Effort normal.  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  Skin: Skin is warm and dry. There is erythema.     Psychiatric: Mood and affect normal.    Assessment/Plan:     ICD-10-CM   1. Wound of left buttock, subsequent encounter  S31.829D   2. Sacral wound, subsequent encounter  S31.000D     Christopher [name] is overall doing well in regards to his sacral wound, his left hip wound has not improved since his last visit, but has not worsened.  Per family they have spoken with Dr. Doreatha [name], Ortho about possibility for removal of left hip hardware and replacement  due to chronic osteomyelitis and nonhealing wound.  They mention that Dr. Jena [name] would be reaching out to Dr. Ulice [name] about further surgical intervention and management of skin flap status post hardware removal and replacement.  The plan was still new and details are to be further determined in the near future.   Continue with healthy diet, multivitamin, alginate to left hip, collagen to sacral wound.  Sacral wound is doing well.  It is slowly improving.  Left hip wound has stalled.  Does not appear overly  infected, but it does continue to drain, likely wont heal until osteomyelitis is resolved.  He continues to take cefdinir and metronidazole per ID.  Call with any questions or concerns, will discuss with Dr. Ulice [name] and inform her of plan for possible removal of left hip hardware and our involvement in the possible skin flap/closure after removal of hardware.  Pictures were obtained of the patient and placed in the chart with the patient's or guardian's permission.   Christopher Balo Anissia Wessells, PA-C 07/17/2019, 3:36 PM

## 2019-07-17 NOTE — Telephone Encounter (Signed)
Christopher [name], PT from [facility] called requesting verbal orders for extension HHPT 1wk3 for transters and ambulation. Orders approved and given.

## 2019-07-19 ENCOUNTER — Ambulatory Visit (INDEPENDENT_AMBULATORY_CARE_PROVIDER_SITE_OTHER): Payer: No Typology Code available for payment source | Admitting: Family Medicine

## 2019-07-19 ENCOUNTER — Encounter: Payer: Self-pay | Admitting: Family Medicine

## 2019-07-19 DIAGNOSIS — M86652 Other chronic osteomyelitis, left thigh: Secondary | ICD-10-CM | POA: Diagnosis not present

## 2019-07-19 DIAGNOSIS — R5381 Other malaise: Secondary | ICD-10-CM

## 2019-07-19 DIAGNOSIS — L03032 Cellulitis of left toe: Secondary | ICD-10-CM | POA: Diagnosis not present

## 2019-07-19 NOTE — Assessment & Plan Note (Signed)
Managed by ID. Currently on cefdinir and flagyl.  Undergoing consideration for hardware removal with orthopedics.

## 2019-07-19 NOTE — Progress Notes (Signed)
   Christopher [name] is a 54 y.o. male who presents today for a virtual office visit.  Assessment/Plan:  New/Acute Problems: Paronychia Patient already on cefdinir and Flagyl, doubt adding additional antibiotic will be of much benefit at this point.  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 recommended they try Epson salt soaks over the next few days to continue triple antibiotic ointment.  If no improvement in the next few days will need to have either I&D or toenail removal.  They voiced understanding.  They will follow with me early next week if this is the case.  Chronic Conditions Addressed today: Chronic osteomyelitis of pelvis, left (HCC) Managed by ID. Currently on cefdinir and flagyl.  Undergoing consideration for hardware removal with orthopedics.  Debility Stable.     Subjective:  HPI:  Patient has had ingrown toenail on left great toe.  Is been present for about a week.  His home health nurse is worried about possible infection.  It is very painful.  They have been using triple antibiotic ointment with no improvement.  He is currently on the cefdinir and Flagyl due to infected hip hardware.  Symptoms seem to be stable.       Objective/Observations  Physical Exam: Gen: NAD, resting comfortably Pulm: Normal work of breathing Neuro: Grossly normal, moves all extremities Psych: Normal affect and thought content MSK: Lateral edge of left great toenail with erythema and edema.  Virtual Visit via Video   I connected with [name] on 07/19/19 at 11:00 AM EST by a video enabled telemedicine application and verified that I am speaking with the correct person using two identifiers. The limitations of evaluation and management by telemedicine and the availability of in person appointments were discussed. The patient expressed understanding and agreed to proceed.   Patient location: Home Provider location: [place] [employer] Persons participating in the virtual visit: Myself and Patient     Katina Degree. [name], MD 07/19/2019 11:33 AM

## 2019-07-19 NOTE — Assessment & Plan Note (Signed)
Stable

## 2019-07-22 ENCOUNTER — Other Ambulatory Visit: Payer: Self-pay | Admitting: Registered Nurse

## 2019-07-22 ENCOUNTER — Telehealth: Payer: Self-pay

## 2019-07-22 DIAGNOSIS — F411 Generalized anxiety disorder: Secondary | ICD-10-CM

## 2019-07-22 NOTE — Telephone Encounter (Signed)
Kayla OT [facility] called requesting verbal orders for OT for 1wk4.  Called her back and approved verbal orders.

## 2019-07-23 NOTE — Telephone Encounter (Signed)
Recieved electronic medication refill request for Klonopin medication for this patient. Have not seen any mention of this medication in previous notes.  Unsure if ok to refill.  Please advise.

## 2019-07-26 ENCOUNTER — Telehealth: Payer: Self-pay

## 2019-07-26 NOTE — Telephone Encounter (Signed)
Is he drinking alcohol at all because that + flagyl can make ppl very very sick with N&V  If he is not I would consider switch from current antibiotics to augmentin twice daily  He needs to have a CMP done to make sure he is not dehydrated or having other problems

## 2019-07-26 NOTE — Telephone Encounter (Signed)
Ok and he can stop her current antibiotics for a little bit, make sure he is gettign hydration and we can revisit how he is doing when he comes for labs

## 2019-07-26 NOTE — Telephone Encounter (Signed)
Pt notified. Will update office on how he is feeling Monday Lorenso [place], [place]

## 2019-07-26 NOTE — Telephone Encounter (Signed)
Spoke with patient's wife who states patient is not drinking any alcohol while on flagyl. Is scheduled to have lab done on 2/15. Lorenso [place], [place]

## 2019-07-26 NOTE — Telephone Encounter (Signed)
Patient's wife called office today requesting advise from MD. States that patient has been feeling nauseous since staring oral antibiotics. Patient does actively vomit sometimes. Is complaining of achy stomach.  Wife has given patient Zofran; states this helps him a little bit.  Patient is not able to participate in physical therapy due to nausea and sore stomach.  Would like to know if MD would be able to provide advise on how to handle nausea. Or if oral antibiotics could be switched to alternative.  If prescription is being sent in [facility] [place]. Lorenso [place], [place]

## 2019-07-29 ENCOUNTER — Other Ambulatory Visit: Payer: Self-pay

## 2019-07-29 ENCOUNTER — Other Ambulatory Visit: Payer: No Typology Code available for payment source

## 2019-07-29 ENCOUNTER — Telehealth: Payer: Self-pay | Admitting: Physical Medicine and Rehabilitation

## 2019-07-29 DIAGNOSIS — M86652 Other chronic osteomyelitis, left thigh: Secondary | ICD-10-CM

## 2019-07-29 LAB — COMPREHENSIVE METABOLIC PANEL
AG Ratio: 1.2 (calc) (ref 1.0–2.5)
ALT: 13 U/L (ref 9–46)
AST: 12 U/L (ref 10–35)
Albumin: 3.5 g/dL — ABNORMAL LOW (ref 3.6–5.1)
Alkaline phosphatase (APISO): 135 U/L (ref 35–144)
BUN/Creatinine Ratio: 20 (calc) (ref 6–22)
BUN: 11 mg/dL (ref 7–25)
CO2: 26 mmol/L (ref 20–32)
Calcium: 9.1 mg/dL (ref 8.6–10.3)
Chloride: 106 mmol/L (ref 98–110)
Creat: 0.56 mg/dL — ABNORMAL LOW (ref 0.70–1.33)
Globulin: 3 g/dL (calc) (ref 1.9–3.7)
Glucose, Bld: 128 mg/dL — ABNORMAL HIGH (ref 65–99)
Potassium: 3.6 mmol/L (ref 3.5–5.3)
Sodium: 140 mmol/L (ref 135–146)
Total Bilirubin: 0.4 mg/dL (ref 0.2–1.2)
Total Protein: 6.5 g/dL (ref 6.1–8.1)

## 2019-07-29 NOTE — Telephone Encounter (Signed)
done

## 2019-07-29 NOTE — Telephone Encounter (Signed)
Due to recent change in pharmacy policy, unable to call medication into pharmacy and must be E-scribed.

## 2019-07-29 NOTE — Telephone Encounter (Signed)
Refilled Marinol 2.5 mg BID for appetite and pain- gave 5 RFs

## 2019-07-30 ENCOUNTER — Telehealth: Payer: Self-pay | Admitting: Family Medicine

## 2019-07-30 ENCOUNTER — Ambulatory Visit (INDEPENDENT_AMBULATORY_CARE_PROVIDER_SITE_OTHER): Payer: No Typology Code available for payment source | Admitting: Family Medicine

## 2019-07-30 ENCOUNTER — Encounter: Payer: Self-pay | Admitting: Family Medicine

## 2019-07-30 VITALS — BP 136/82 | HR 79 | Temp 97.6°F

## 2019-07-30 DIAGNOSIS — L600 Ingrowing nail: Secondary | ICD-10-CM

## 2019-07-30 NOTE — Telephone Encounter (Signed)
Pt wife called stating pt was seen in office this morning for ingrown toenail removal. Wife states physical therapy is at their home and recorded pts BP of 170/98. Wife states this is very unusual for pt and would like to speak with nurse. Pt is not experiencing any other symptoms. Please advise.

## 2019-07-30 NOTE — Progress Notes (Signed)
   Christopher [name] is a 54 y.o. male who presents today for an office visit.  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  Partial avulsion performed today-see below procedure note.  He tolerated well.  Continues over-the-counter meds as needed for pain.  Discussed warning signs and reasons to return to care.  Follow-up as needed.    Subjective:  HPI:  Patient here with painful left great toenail.  Has been painful for a few weeks.  He was seen virtually couple of weeks ago and there was concern for possible paronychia.  He was recently seen by infectious disease for chronic osteomyelitis.  He stopped his Omnicef and Flagyl due to GI side effects.  Has toe is very painful.       Objective:  Physical Exam: BP 136/82   Pulse 79   Temp 97.6 F (36.4 C)   SpO2 97%   Gen: No acute distress, resting comfortably MSK: Medial nail fold of the right great toe edematous and erythematous with dried blood in place.  Slight lateral deviation noted to all toes bilaterally.  Toenail Avulsion Procedure Note  Pre-operative Diagnosis: Left Ingrown Great toenail   Post-operative Diagnosis: Left Ingrown Great toenail  Indications: Therapeutic  Anesthesia: Lidocaine 1% without epinephrine without added sodium bicarbonate  Procedure Details  History of allergy to iodine: no  The risks (including bleeding and infection) and benefits of the  procedure and Verbal informed consent obtained.  After digital block anesthesia was obtained, a tourniquet was applied for hemostasis during the procedure.  After prepping with Betadine, the offending edge of the nail was freed from the nailbed and perionychium, and then split with scissors and removed with forceps.  All visible granulation tissue is debrided. Antibiotic and bulky dressing was applied.    Findings: N/A  Complications: none.  Plan: 1. Soak the foot twice daily. Change dressing twice daily until healed over. 2. Warning signs of infection were reviewed.   3. Recommended that the patient use OTC analgesics as needed for pain.       Katina Degree. [name], MD 07/30/2019 8:51 AM

## 2019-07-30 NOTE — Patient Instructions (Signed)
Fingernail or Toenail Removal, Adult, Care After °This sheet gives you information about how to care for yourself after your procedure. Your health care provider may also give you more specific instructions. If you have problems or questions, contact your health care provider. °What can I expect after the procedure? °After the procedure, it is common to have: °· Pain. °· Redness. °· Swelling. °· Soreness. °Follow these instructions at home: °Medicines °· Take over-the-counter and prescription medicines only as told by your health care provider. °· If you were prescribed an antibiotic medicine, take or apply it as told by your health care provider. Do not stop using the antibiotic even if you start to feel better. °Wound care °· Follow instructions from your health care provider about how to take care of your wound. Make sure you: °? Wash your hands with soap and water for at least 20 seconds before and after you change your bandage (dressing). If soap and water are not available, use hand sanitizer. °? Change your dressing as told by your health care provider. °? Keep your dressing dry until your health care provider says it can be removed. °· Check your wound every day for signs of infection. Check for: °? More redness, swelling, or pain. °? More fluid or blood. °? Warmth. °? Pus or a bad smell. °If you have a splint: ° °· Wear the splint as told by your health care provider. Remove it only as told by your health care provider. °· Loosen the splint if your fingers tingle, become numb, or turn cold and blue. °· Keep the splint clean. °· If the splint is not waterproof: °? Do not let it get wet. °? Cover it with a watertight covering when you take a bath or a shower. °Managing pain, stiffness, and swelling °· Move your fingers or toes often to reduce stiffness and swelling. °· Raise (elevate) the injured area above the level of your heart while you are sitting or lying down. You may need to keep your hand or foot  raised or supported on a pillow for 24 hours or as told by your health care provider. °General instructions °· If you were given a shoe to wear, wear it as told by your health care provider. °· Keep all follow-up visits as told by your health care provider. This is important. °Contact a health care provider if: °· You have more redness, swelling, or pain around your wound. °· You have more fluid or blood coming from your wound. °· You have pus or a bad smell coming from your wound. °· Your wound feels warm to the touch. °· You have a fever. °Get help right away if: °· Your finger or toe looks pale, blue, or black. °· You are not able to move your finger or toe. °Summary °· After the procedure, it is common to have pain and swelling. °· Keep the hand or foot raised (elevated) or supported on a pillow as told by your health care provider. °· Take over-the-counter and prescription medicines only as told by your health care provider. °· Check your wound every day for signs of infection. °This information is not intended to replace advice given to you by your health care provider. Make sure you discuss any questions you have with your health care provider. °Document Revised: 01/21/2019 Document Reviewed: 01/21/2019 °Elsevier Patient Education © 2020 [employer] ° °

## 2019-07-31 ENCOUNTER — Telehealth: Payer: Self-pay

## 2019-07-31 MED ORDER — OXYCODONE-ACETAMINOPHEN 5-325 MG PO TABS: 1.0000 | ORAL_TABLET | ORAL | 0 refills | Status: DC | PRN

## 2019-07-31 NOTE — Telephone Encounter (Signed)
Ordered Percocet #100 5/325 mg q4  Hours prn  Will con't all other meds and refill as needed.

## 2019-07-31 NOTE — Telephone Encounter (Signed)
Called wife states that he was on bp meds in the past but was bottoming it out so it was d/c. He has not had any headache, changes in vision, chest pain. She will have OT check today and if still elevated she will let our office know. Have reviewed red words and if any of the red words she will call 911 or go to ED

## 2019-07-31 NOTE — Telephone Encounter (Signed)
Melanie called requesting a refill for Oxycodone/APAP for patient. [facility] [place]

## 2019-07-31 NOTE — Telephone Encounter (Signed)
Noted. Agree with plan.  Katina Degree. [name], MD 07/31/2019 11:09 AM

## 2019-08-02 ENCOUNTER — Emergency Department (HOSPITAL_COMMUNITY): Payer: Self-pay | Attending: Emergency Medicine

## 2019-08-02 ENCOUNTER — Emergency Department (HOSPITAL_COMMUNITY): Payer: No Typology Code available for payment source

## 2019-08-02 ENCOUNTER — Telehealth: Payer: Self-pay

## 2019-08-02 ENCOUNTER — Other Ambulatory Visit: Payer: Self-pay

## 2019-08-02 ENCOUNTER — Encounter (HOSPITAL_COMMUNITY): Payer: Self-pay

## 2019-08-02 ENCOUNTER — Emergency Department (HOSPITAL_COMMUNITY)
Admission: EM | Admit: 2019-08-02 | Discharge: 2019-08-02 | Disposition: A | Discharge status: Home / Self Care | Payer: No Typology Code available for payment source | Attending: Emergency Medicine | Admitting: Emergency Medicine

## 2019-08-02 DIAGNOSIS — Z933 Colostomy status: Secondary | ICD-10-CM | POA: Insufficient documentation

## 2019-08-02 DIAGNOSIS — Z87891 Personal history of nicotine dependence: Secondary | ICD-10-CM | POA: Insufficient documentation

## 2019-08-02 DIAGNOSIS — R0602 Shortness of breath: Secondary | ICD-10-CM

## 2019-08-02 DIAGNOSIS — Z79899 Other long term (current) drug therapy: Secondary | ICD-10-CM | POA: Insufficient documentation

## 2019-08-02 DIAGNOSIS — J189 Pneumonia, unspecified organism: Secondary | ICD-10-CM | POA: Diagnosis not present

## 2019-08-02 DIAGNOSIS — R252 Cramp and spasm: Secondary | ICD-10-CM | POA: Diagnosis not present

## 2019-08-02 LAB — COMPREHENSIVE METABOLIC PANEL
ALT: 16 U/L (ref 0–44)
AST: 17 U/L (ref 15–41)
Albumin: 3 g/dL — ABNORMAL LOW (ref 3.5–5.0)
Alkaline Phosphatase: 133 U/L — ABNORMAL HIGH (ref 38–126)
Anion gap: 10 (ref 5–15)
BUN: 10 mg/dL (ref 6–20)
CO2: 24 mmol/L (ref 22–32)
Calcium: 9.4 mg/dL (ref 8.9–10.3)
Chloride: 104 mmol/L (ref 98–111)
Creatinine, Ser: 0.61 mg/dL (ref 0.61–1.24)
GFR calc Af Amer: >60 mL/min (ref >60)
GFR calc non Af Amer: >60 mL/min (ref >60)
Glucose, Bld: 87 mg/dL (ref 70–99)
Potassium: 3.8 mmol/L (ref 3.5–5.1)
Sodium: 138 mmol/L (ref 135–145)
Total Bilirubin: 0.6 mg/dL (ref 0.3–1.2)
Total Protein: 7 g/dL (ref 6.5–8.1)

## 2019-08-02 LAB — LACTIC ACID, PLASMA: Lactic Acid, Venous: 1.6 mmol/L (ref 0.5–1.9)

## 2019-08-02 LAB — CBC WITH DIFFERENTIAL/PLATELET
Abs Immature Granulocytes: 0.02 10*3/uL (ref 0.00–0.07)
Basophils Absolute: 0 10*3/uL (ref 0.0–0.1)
Basophils Relative: 0 %
Eosinophils Absolute: 0.3 10*3/uL (ref 0.0–0.5)
Eosinophils Relative: 4 %
HCT: 41.2 % (ref 39.0–52.0)
Hemoglobin: 13.1 g/dL (ref 13.0–17.0)
Immature Granulocytes: 0 %
Lymphocytes Relative: 8 %
Lymphs Abs: 0.7 10*3/uL (ref 0.7–4.0)
MCH: 32.4 pg (ref 26.0–34.0)
MCHC: 31.8 g/dL (ref 30.0–36.0)
MCV: 102 fL — ABNORMAL HIGH (ref 80.0–100.0)
Monocytes Absolute: 0.4 10*3/uL (ref 0.1–1.0)
Monocytes Relative: 4 %
Neutro Abs: 7.1 10*3/uL (ref 1.7–7.7)
Neutrophils Relative %: 84 %
Platelets: 275 10*3/uL (ref 150–400)
RBC: 4.04 MIL/uL — ABNORMAL LOW (ref 4.22–5.81)
RDW: 13.2 % (ref 11.5–15.5)
WBC: 8.5 10*3/uL (ref 4.0–10.5)
nRBC: 0 % (ref 0.0–0.2)

## 2019-08-02 LAB — URINALYSIS, ROUTINE W REFLEX MICROSCOPIC
Bilirubin Urine: NEGATIVE
Glucose, UA: NEGATIVE mg/dL
Ketones, ur: NEGATIVE mg/dL
Nitrite: NEGATIVE
Protein, ur: NEGATIVE mg/dL
RBC / HPF: >50 RBC/hpf — ABNORMAL HIGH (ref 0–5)
Specific Gravity, Urine: 1.008 (ref 1.005–1.030)
WBC, UA: >50 WBC/hpf — ABNORMAL HIGH (ref 0–5)
pH: 8 (ref 5.0–8.0)

## 2019-08-02 LAB — APTT: aPTT: 32 seconds (ref 24–36)

## 2019-08-02 LAB — PROTIME-INR
INR: 1 (ref 0.8–1.2)
Prothrombin Time: 12.8 seconds (ref 11.4–15.2)

## 2019-08-02 MED ORDER — IOHEXOL 350 MG/ML SOLN
80.0000 mL | Freq: Once | INTRAVENOUS | Status: AC | PRN
  Administered 2019-08-02: 80 mL via INTRAVENOUS

## 2019-08-02 MED ORDER — CEFDINIR 300 MG PO CAPS
300.0000 mg | ORAL_CAPSULE | Freq: Two times a day (BID) | ORAL | Status: DC
  Filled 2019-08-02 (×2): qty 1

## 2019-08-02 MED ORDER — AEROCHAMBER PLUS FLO-VU LARGE MISC
Status: AC
  Administered 2019-08-02: 1
  Filled 2019-08-02: qty 1

## 2019-08-02 MED ORDER — SODIUM CHLORIDE 0.9 % IV BOLUS
500.0000 mL | Freq: Once | INTRAVENOUS | Status: AC
  Administered 2019-08-02: 500 mL via INTRAVENOUS

## 2019-08-02 MED ORDER — ALBUTEROL SULFATE HFA 108 (90 BASE) MCG/ACT IN AERS
2.0000 | INHALATION_SPRAY | Freq: Once | RESPIRATORY_TRACT | Status: AC
  Administered 2019-08-02: 2 via RESPIRATORY_TRACT
  Filled 2019-08-02: qty 6.7

## 2019-08-02 MED ORDER — OXYCODONE-ACETAMINOPHEN 5-325 MG PO TABS
2.0000 | ORAL_TABLET | Freq: Once | ORAL | Status: AC
  Administered 2019-08-02: 2 via ORAL
  Filled 2019-08-02: qty 2

## 2019-08-02 MED ORDER — AZITHROMYCIN 250 MG PO TABS
500.0000 mg | ORAL_TABLET | Freq: Once | ORAL | Status: AC
  Administered 2019-08-02: 500 mg via ORAL
  Filled 2019-08-02: qty 2

## 2019-08-02 MED ORDER — OXYCODONE HCL 5 MG PO TABS
5.0000 mg | ORAL_TABLET | Freq: Once | ORAL | Status: AC
  Administered 2019-08-02: 5 mg via ORAL
  Filled 2019-08-02: qty 1

## 2019-08-02 MED ORDER — AZITHROMYCIN 250 MG PO TABS: 250.0000 mg | ORAL_TABLET | Freq: Every day | ORAL | 0 refills | Status: DC

## 2019-08-02 NOTE — ED Notes (Signed)
Patient verbalizes understanding of discharge instructions. Opportunity for questioning and answers were provided. Armband removed by staff, pt discharged from ED.  

## 2019-08-02 NOTE — Telephone Encounter (Signed)
Received call from patient's partner informing us that Christopher [name] went to the ED today for shortness of breath.

## 2019-08-02 NOTE — Progress Notes (Signed)
Bilateral lower extremity venous duplex exam completed.  Preliminary results can be found under CV proc under chart review.  08/02/2019 3:45 PM  Mandisa Persinger, K., RDMS, RVT

## 2019-08-02 NOTE — ED Notes (Signed)
Called ptar for pt transport  
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
## 2019-08-02 NOTE — ED Provider Notes (Signed)
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 of spasms and cramping in his legs bilaterally, worse with physical therapy. He is setting comfortably on room air. Given his relative immobility a CTA was obtained, which was negative for PE. CT demonstrates infiltrate versus atelectasis, will start on antibiotics. Patient did recently complete a course of cefdinir about one week ago, will treat with azithromycin alone. Vascular ultrasound's are negative for DVT. Discussed with patient and wife findings of studies in treatment plan and they are in agreement. Plan to discharge home with outpatient follow-up and return precautions. Final Clinical Impression(s) / ED Diagnoses Final diagnoses:  Community acquired pneumonia of left lower lobe of lung    Rx / DC Orders ED Discharge Orders         Ordered    azithromycin (ZITHROMAX) 250 MG tablet  Daily     08/02/19 1535            Tilden Fossaees, Odean Mcelwain, MD 08/02/19 1551

## 2019-08-02 NOTE — ED Notes (Signed)
5th on PTARS list

## 2019-08-03 LAB — URINE CULTURE: Culture: >=100000 — AB

## 2019-08-05 ENCOUNTER — Telehealth: Payer: Self-pay | Admitting: Emergency Medicine

## 2019-08-05 ENCOUNTER — Other Ambulatory Visit: Payer: Self-pay

## 2019-08-05 ENCOUNTER — Encounter: Payer: Self-pay | Admitting: Physical Medicine and Rehabilitation

## 2019-08-05 ENCOUNTER — Encounter
Payer: No Typology Code available for payment source | Attending: Physical Medicine and Rehabilitation | Admitting: Physical Medicine and Rehabilitation

## 2019-08-05 VITALS — BP 120/72 | HR 68 | Temp 97.7°F | Ht 75.0 in

## 2019-08-05 DIAGNOSIS — E440 Moderate protein-calorie malnutrition: Secondary | ICD-10-CM | POA: Diagnosis not present

## 2019-08-05 DIAGNOSIS — T07XXXA Unspecified multiple injuries, initial encounter: Secondary | ICD-10-CM | POA: Insufficient documentation

## 2019-08-05 DIAGNOSIS — Z933 Colostomy status: Secondary | ICD-10-CM | POA: Diagnosis present

## 2019-08-05 DIAGNOSIS — M7918 Myalgia, other site: Secondary | ICD-10-CM | POA: Diagnosis present

## 2019-08-05 DIAGNOSIS — Z9359 Other cystostomy status: Secondary | ICD-10-CM | POA: Diagnosis present

## 2019-08-05 DIAGNOSIS — S31829D Unspecified open wound of left buttock, subsequent encounter: Secondary | ICD-10-CM | POA: Diagnosis present

## 2019-08-05 DIAGNOSIS — G8921 Chronic pain due to trauma: Secondary | ICD-10-CM | POA: Insufficient documentation

## 2019-08-05 MED ORDER — OXYCODONE-ACETAMINOPHEN 5-325 MG PO TABS: 1.0000 | ORAL_TABLET | ORAL | 0 refills | Status: DC | PRN

## 2019-08-05 NOTE — Telephone Encounter (Signed)
Post ED Visit - Positive Culture Follow-up  Culture report reviewed by antimicrobial stewardship pharmacist: [facility] []  , Pharm.D. []  Enzo Bi, Pharm.D., BCPS AQ-ID []  , Pharm.D., BCPS []  Celedonio Miyamoto, Pharm.D., BCPS []  [facility], Garvin Fila.D., BCPS, AAHIVP []  , Pharm.D., BCPS, AAHIVP []  Georgina Pillion, PharmD, BCPS []  , PharmD, BCPS []  [place], PharmD, BCPS []  [address], PharmD []  , PharmD, BCPS []  Estella Husk, PharmD  Pharmacy Team []  Lysle Pearl, PharmD []  , PharmD []  Phillips Climes, PharmD []  , Rph []  Agapito Games) , PharmD []  Verlan Friends, PharmD []  , PharmD []  Mervyn Gay, PharmD []  , PharmD []  Vinnie Level, PharmD []  [facility], PharmD []  , PharmD []  Len Childs, PharmD PharmD   Positive urine culture asymtpomatic and no further patient follow-up is required at this time.  Greer Pickerel 08/05/2019, 9:26 AM

## 2019-08-05 NOTE — Patient Instructions (Signed)
Pt is a 54 yr old male with hx of Multitrauma- causing L femoral neck fx, degloving of L hip to going/scrotum, bladder neck trauma- got SPC, developed compartment syndrome -s/p surgery for that; also diverting colostomy, skin grafts, and s/p trachand PEG- PEG is out. 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 Need 2nd opinion for Ortho to fix infection of L hip.  2. Increase Percocet 5/325 mg to 150 tab/month so when hurting more on bad days can increase usage.   3. Regular manual w/c with current pressure relieving w/c cushion to be used. [employer] Needs for when power goes out- and to be able to transport to get to appointments easier.    4. Can't help with COVID vaccines   5. Needs to be looked at for a generator due to his power w/c as well as power pressure relieving mattress on hospital bed. Until then, will try and call Power company- [employer]- listed under his name.    6. Suggest that pt can be bathed and just cover wound for shower. Do it on days wafer to be changed for colostomy.     7. Needs shower bench- to slide him in and out. To get in and out of shower.   8. Patient here for trigger point injections for tight muscles/myofascial pain/chronic pain.   Consent done and on chart.  Cleaned areas with alcohol and injected using a 27 gauge 1.5 inch needle  Injected 4cc Using 1% Lidocaine with no EPI  Upper traps B/L Levators B/L Posterior scalenes B/L Middle scalenes Splenius Capitus Pectoralis Major B/L Rhomboids Infraspinatus Teres Major/minor Thoracic paraspinals _L only Lumbar paraspinals Other injections-    Patient's level of pain prior was was 7/10 Current level of pain after injections is now 6/10- and ROM is better and muscles are looser  There was no  bleeding or complications.  Patient was advised to drink a lot of water on day after injections to flush system Will have increased soreness for 12-48 hours after injections.  Can use Lidocaine patches the day AFTER injections Can use theracane on day of injections in places didn't inject Can use heating pad 4-6 hours AFTER injections  9. F/U in 6 weeks

## 2019-08-05 NOTE — Progress Notes (Signed)
Subjective:    Patient ID: Christopher [name], male    DOB: [birth year], 54 y.o.   MRN: [id number]  HPI  Having a lot of pain- most pain in L side/hip and back today- none in shoulders today- but is stiff in shoulders.     Was on Flagyl and Onnicef - was really upset his abdomen- abd pain.  Still waiting to hear from ID.    Hurting bad! Starts sweating when hurts this bad.   Still taking the Marinol- gained 10-11 more lbs. Since on marinol working well.   L foot- big toe- ingrown toenail had it cut out-  Last week Toes on L feet are curling towards each other- 1st and 2nd toes-    "just wants pain to stop".  Spiking more lately- every morning, wakes up and hurting-   PT and OT want to stop- because can't get in shower?  The wound is smaller than dime- and still draining- A lot going on- went to ER over the weekend- told had pneumonia- Zithromax- ends tomorrow-  Then went to Ortho- wants to put in plates and open him up to clean everything out- and no guarantee   Pain Inventory Average Pain 8 Pain Right Now 8 My pain is sharp, burning, dull, stabbing, tingling and aching  In the last 24 hours, has pain interfered with the following? General activity 9 Relation with others 7 Enjoyment of life 9 What TIME of day is your pain at its worst? all Sleep (in general) Fair  Pain is worse with: walking, bending, sitting, inactivity and standing Pain improves with: rest and medication Relief from Meds: 6  Mobility walk without assistance walk with assistance use a cane how many minutes can you walk? 5  ability to climb steps?  no do you drive?  no Do you have any goals in this area?  yes  Function I need assistance with the following:  feeding, dressing, bathing, toileting, meal prep, household duties and shopping Do you have any goals in this area?  yes  Neuro/Psych bladder control problems bowel control problems weakness numbness tremor tingling trouble  walking spasms dizziness depression anxiety  Prior Studies Any changes since last visit?  no  Physicians involved in your care Any changes since last visit?  no   Family History  Problem Relation Age of Onset   Breast cancer Mother        with mets to the bones   Social History   Socioeconomic History   Marital status: Married    Spouse name: Not on file   Number of children: Not on file   Years of education: Not on file   Highest education level: Not on file  Occupational History   Not on file  Tobacco Use   Smoking status: Former Smoker    Packs/day: 1.00    Years: 20.00    Pack years: 20.00    Types: Cigarettes    Quit date: 07/10/2018    Years since quitting: 1.0   Smokeless tobacco: Never Used  Substance and Sexual Activity   Alcohol use: Never   Drug use: Never   Sexual activity: Yes  Other Topics Concern   Not on file  Social History Narrative   ** Merged History Encounter **       Social Determinants of Health   Financial Resource Strain: Low Risk    Difficulty of Paying Living Expenses: Not very hard  Food Insecurity: Food Insecurity Present   Worried  About Running Out of Food in the Last Year: Sometimes true   Ran Out of Food in the Last Year: Sometimes true  Transportation Needs: No Transportation Needs   Lack of Transportation (Medical): No   Lack of Transportation (Non-Medical): No  Physical Activity: Inactive   Days of Exercise per Week: 0 days   Minutes of Exercise per Session: 0 min  Stress: Stress Concern Present   Feeling of Stress : Rather much  Social Connections:    Frequency of Communication with Friends and Family: Not on file   Frequency of Social Gatherings with Friends and Family: Not on file   Attends Religious Services: Not on file   Active Member of Clubs or Organizations: Not on file   Attends Banker Meetings: Not on file   Marital Status: Not on file   Past Surgical History:    Procedure Laterality Date   APPLICATION OF A-CELL OF BACK N/A 08/06/2018   Procedure: Application Of A-Cell Of Back;  Surgeon: Peggye Form, DO;  Location: [facility];  Service: Plastics;  Laterality: N/A;   APPLICATION OF A-CELL OF EXTREMITY Left 08/06/2018   Procedure: Application Of A-Cell Of Extremity;  Surgeon: Peggye Form, DO;  Location: [facility] OR;  Service: Plastics;  Laterality: Left;   APPLICATION OF WOUND VAC  07/12/2018   Procedure: Application Of Wound Vac to the Left Thigh and Scrotum.;  Surgeon: Roby Lofts, MD;  Location: [facility] OR;  Service: Orthopedics;;   APPLICATION OF WOUND VAC  07/10/2018   Procedure: Application Of Wound Vac;  Surgeon: Berna Bue, MD;  Location: [facility] OR;  Service: General;;   COLOSTOMY N/A 07/23/2018   Procedure: COLOSTOMY;  Surgeon: Violeta Gelinas, MD;  Location: [facility] OR;  Service: General;  Laterality: N/A;   CYSTOSCOPY W/ URETERAL STENT PLACEMENT N/A 07/15/2018   Procedure: RETROGRADE URETHROGRAM;  Surgeon: Marcine Matar, MD;  Location: [facility] OR;  Service: Urology;  Laterality: N/A;   CYSTOSCOPY WITH LITHOLAPAXY N/A 05/06/2019   Procedure: CYSTOSCOPY BASKET BLADDER STONE EXTRACTION;  Surgeon: Malen Gauze, MD;  Location: [facility];  Service: Urology;  Laterality: N/A;  30 MINS   CYSTOSTOMY N/A 05/06/2019   Procedure: REPLACEMENT OF SUPRAPUBIC CATHETER;  Surgeon: Malen Gauze, MD;  Location: [facility];  Service: Urology;  Laterality: N/A;   DEBRIDEMENT AND CLOSURE WOUND Left 03/04/2019   Procedure: Excision of hip wound with placement of Acell;  Surgeon: Peggye Form, DO;  [facility]: [facility];  Service: Plastics;  Laterality: Left;   ESOPHAGOGASTRODUODENOSCOPY N/A 08/14/2018   Procedure: ESOPHAGOGASTRODUODENOSCOPY (EGD);  Surgeon: Violeta Gelinas, MD;  Location: [facility] ENDOSCOPY;  Service: General;  Laterality: N/A;  bedside   FACIAL RECONSTRUCTION SURGERY     X 2--once as a teenager and  second time in his 73's   HARDWARE REMOVAL Left 03/04/2019   Procedure: Left Hip Hardware Removal;  Surgeon: Roby Lofts, MD;  [facility]: [facility];  Service: Orthopedics;  Laterality: Left;   HIP PINNING,CANNULATED Left 07/12/2018   Procedure: CANNULATED HIP PINNING;  Surgeon: Roby Lofts, MD;  [facility];  Service: Orthopedics;  Laterality: Left;   HIP SURGERY     HOLMIUM LASER APPLICATION Right 07/15/2019   Procedure: HOLMIUM LASER APPLICATION;  Surgeon: Malen Gauze, MD;  Location: [facility];  Service: Urology;  Laterality: Right;   I & D EXTREMITY Left 07/25/2018   Procedure: Debridement of buttock, scrotum and left leg, placement of acell and  vac;  Surgeon: Peggye Form, DO;  Location: [facility];  Service: Plastics;  Laterality: Left;   I & D EXTREMITY N/A 08/06/2018   Procedure: Debridement of buttock, scrotum and left leg;  Surgeon: Peggye Form, DO;  [facility];  Service: Plastics;  Laterality: N/A;   I & D EXTREMITY N/A 08/13/2018   Procedure: Debridement of buttock, scrotum and left leg, placement of acell and vac;  Surgeon: Peggye Form, DO;  [facility]: [facility];  Service: Plastics;  Laterality: N/A;  90 min, please   INCISION AND DRAINAGE OF WOUND N/A 07/18/2018   Procedure: Debridement of left leg, buttocks and scrotal wound with placement of acell and Flexiseal;  Surgeon: Peggye Form, DO;  Location: [facility];  Service: Plastics;  Laterality: N/A;   INCISION AND DRAINAGE OF WOUND Left 08/29/2018   Procedure: Debridement of buttock, scrotum and left leg, placement of acell and vac;  Surgeon: Peggye Form, DO;  [facility]: [facility];  Service: Plastics;  Laterality: Left;  75 min, please   INCISION AND DRAINAGE OF WOUND Bilateral 10/23/2018   Procedure: DEBRIDEMENT OF BUTTOCK,SCROTUM, AND LEG WOUNDS WITH PLACEMENT OF ACELL- BILATERAL 90 MIN;  Surgeon: Peggye Form, DO;  Location: [facility];  Service: Plastics;  Laterality:  Bilateral;   IR ANGIOGRAM PELVIS SELECTIVE OR SUPRASELECTIVE  07/10/2018   IR ANGIOGRAM PELVIS SELECTIVE OR SUPRASELECTIVE  07/10/2018   IR ANGIOGRAM SELECTIVE EACH ADDITIONAL VESSEL  07/10/2018   IR EMBO ART  VEN HEMORR LYMPH EXTRAV  INC GUIDE ROADMAPPING  07/10/2018   IR NEPHROSTOMY PLACEMENT LEFT  04/05/2019   IR NEPHROSTOMY PLACEMENT RIGHT  05/31/2019   IR US GUIDE BX ASP/DRAIN  07/10/2018   IR US GUIDE VASC ACCESS RIGHT  07/10/2018   IR VENO/EXT/UNI LEFT  07/10/2018   IRRIGATION AND DEBRIDEMENT OF WOUND WITH SPLIT THICKNESS SKIN GRAFT Left 09/19/2018   Procedure: Debridement of gluteal wound with placement of acell to left leg.;  Surgeon: Peggye Form, DO;  Location: [facility];  Service: Plastics;  Laterality: Left;  2.5 hours, please   LAPAROTOMY N/A 07/12/2018   Procedure: EXPLORATORY LAPAROTOMY;  Surgeon: Violeta Gelinas, MD;  Location: [facility] OR;  Service: General;  Laterality: N/A;   LAPAROTOMY N/A 07/15/2018   Procedure: WOUND EXPLORATION; CLOSURE OF ABDOMEN;  Surgeon: Violeta Gelinas, MD;  Location: [facility] OR;  Service: General;  Laterality: N/A;   LAPAROTOMY  07/10/2018   Procedure: Exploratory Laparotomy;  Surgeon: Berna Bue, MD;  Location: [facility] OR;  Service: General;;   NEPHROLITHOTOMY Right 07/15/2019   Procedure: NEPHROLITHOTOMY PERCUTANEOUS;  Surgeon: Malen Gauze, MD;  Location: [facility];  Service: Urology;  Laterality: Right;  90 MINS   PEG PLACEMENT N/A 08/14/2018   Procedure: PERCUTANEOUS ENDOSCOPIC GASTROSTOMY (PEG) PLACEMENT;  Surgeon: Violeta Gelinas, MD;  Location: [facility] ENDOSCOPY;  Service: General;  Laterality: N/A;   PERCUTANEOUS TRACHEOSTOMY N/A 08/02/2018   Procedure: PERCUTANEOUS TRACHEOSTOMY;  Surgeon: Violeta Gelinas, MD;  Location: [facility] OR;  Service: General;  Laterality: N/A;   RADIOLOGY WITH ANESTHESIA N/A 07/10/2018   Procedure: IR WITH ANESTHESIA;  Surgeon: Simonne Come, MD;  Location: [facility] OR;  Service: Radiology;  Laterality: N/A;    RADIOLOGY WITH ANESTHESIA Right 07/10/2018   Procedure: Ir With Anesthesia;  Surgeon: Simonne Come, MD;  Location: [facility] OR;  Service: Radiology;  Laterality: Right;   SCROTAL EXPLORATION N/A 07/15/2018   Procedure: SCROTUM DEBRIDEMENT;  Surgeon: Marcine Matar, MD;  Location: [facility] OR;  Service: Urology;  Laterality: N/A;   SHOULDER SURGERY     SKIN SPLIT GRAFT Right 09/19/2018   Procedure: Skin Graft Split Thickness;  Surgeon: Peggye Form, DO;  [facility];  Service: Plastics;  Laterality: Right;   SKIN SPLIT GRAFT N/A 10/03/2018   Procedure: Split thickness skin graft to gluteal area with acell placement;  Surgeon: Peggye Form, DO;  Location[facility];  Service: Plastics;  Laterality: N/A;  3 hours, please   VACUUM ASSISTED CLOSURE CHANGE N/A 07/12/2018   Procedure: ABDOMINAL VACUUM ASSISTED CLOSURE CHANGE and abdominal washout;  Surgeon: Violeta Gelinas, MD;  Location: [facility] OR;  Service: General;  Laterality: N/A;   WOUND DEBRIDEMENT Left 07/23/2018   Procedure: DEBRIDEMENT LEFT BUTTOCK  WOUND;  Surgeon: Violeta Gelinas, MD;  Location: [facility] OR;  Service: General;  Laterality: Left;   WOUND EXPLORATION Left 07/10/2018   Procedure: WOUND EXPLORATION LEFT GROIN;  Surgeon: Berna Bue, MD;  Location: [facility] OR;  Service: General;  Laterality: Left;   Past Medical History:  Diagnosis Date   Acute on chronic respiratory failure with hypoxia (HCC) 06/2018   trach removed 11-16-2018, on vent from jan until may 2020   Bacteremia due to Pseudomonas 06/2018   Chronic pain syndrome    History of kidney stones    Multiple traumatic injuries    Wound discharge    left hip wound with bloody/clear drainage change dressing q day surgilube with gauze, between legs wound using calcium algenate pad bid   BP 120/72    Pulse 68    Temp 97.7 F (36.5 C)    Ht 6\' 3"  (1.905 m) Comment: patient reported   SpO2 93%    BMI 17.62 kg/m   Opioid Risk Score:   Fall Risk Score:  `1  Depression  screen PHQ 2/9  Depression screen [facility] 2/9 04/10/2019 12/27/2018  Decreased Interest 0 2  Down, Depressed, Hopeless 0 0  PHQ - 2 Score 0 2  Altered sleeping - 1  Tired, decreased energy - 2  Change in appetite - 1  Feeling bad or failure about yourself  - 1  Trouble concentrating - 0  Moving slowly or fidgety/restless - 2  Suicidal thoughts - 0  PHQ-9 Score - 9  Some recent data might be hidden    Review of Systems  Constitutional: Negative.   HENT: Negative.   Eyes: Negative.   Cardiovascular: Negative.   Gastrointestinal:       Neurogenic bowel   Endocrine: Negative.   Genitourinary:       Neurogenic bladder  Musculoskeletal: Positive for back pain and gait problem.  Skin: Positive for wound.  Allergic/Immunologic: Negative.   Neurological: Positive for tremors, weakness and numbness.       Tingling   Hematological: Negative.   Psychiatric/Behavioral: Positive for dysphoric mood. The patient is nervous/anxious.   All other systems reviewed and are negative.      Objective:   Physical Exam  Crying a lot- tearful; accompanied by wife, NAD Sitting in power w/c- joystick on R; Sitting on R side        Assessment & Plan:    Pt is a 54 yr old male with hx of Multitrauma- causing L femoral neck fx, degloving of L hip to going/scrotum, bladder neck trauma- got SPC, developed compartment syndrome -s/p surgery for that; also diverting colostomy, skin grafts, and s/p trachand PEG- PEG is out. Also has moderate protein-calorie malnutrition, anxiety due to multitrauma, and chronic pain syndrome due to  trauma.S/P screw removal and on IV ABX for L hip osteomyelitis. 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 Need 2nd opinion for Ortho to fix infection of L hip.  2. Increase Percocet 5/325 mg to 150 tab/month so when hurting more on bad days can increase usage.   3. Regular manual w/c with current pressure relieving w/c cushion to be used. [place] Needs for when power goes out- and to be able to transport to get to appointments easier.    4. Can't help with COVID vaccines   5. Needs to be looked at for a generator due to his power w/c as well as power pressure relieving mattress on hospital bed. Until then, will try and call [employer]- listed under his name.    6. Suggest that pt can be bathed and just cover wound for shower. Do it on days wafer to be changed for colostomy.     7. Needs shower bench- to slide him in and out. To get in and out of shower.   8. Patient here for trigger point injections for tight muscles/myofascial pain/chronic pain.   Consent done and on chart.  Cleaned areas with alcohol and injected using a 27 gauge 1.5 inch needle  Injected 4cc Using 1% Lidocaine with no EPI  Upper traps B/L Levators B/L Posterior scalenes B/L Middle scalenes Splenius Capitus Pectoralis Major B/L Rhomboids Infraspinatus Teres Major/minor Thoracic paraspinals _L only Lumbar paraspinals Other injections-    Patient's level of pain prior was was 7/10 Current level of pain after injections is now 6/10- and ROM is better and muscles are looser  There was no bleeding or complications.  Patient was advised to drink a lot of water on day after injections to flush system Will have increased soreness for 12-48 hours after injections.  Can use Lidocaine patches the day AFTER injections Can use theracane on day of injections in places didn't inject Can use heating pad 4-6 hours AFTER injections  9. F/U in 6 weeks  I spent a total of 1 hour on appointment- more than 30 minutes educating per note as above; and doing TrP injections.

## 2019-08-07 LAB — CULTURE, BLOOD (ROUTINE X 2)
Culture: NO GROWTH
Culture: NO GROWTH
Special Requests: ADEQUATE
Special Requests: ADEQUATE

## 2019-08-12 ENCOUNTER — Telehealth: Payer: Self-pay | Admitting: *Deleted

## 2019-08-12 NOTE — Telephone Encounter (Signed)
Christopher [name], PT, [facility] left a message asking for verbal orders for HHPT 2wk5. Medical record reviewed. Social work note reviewed.  Verbal orders given per office protocol.

## 2019-08-13 ENCOUNTER — Telehealth: Payer: Self-pay | Admitting: *Deleted

## 2019-08-13 NOTE — Telephone Encounter (Signed)
Christopher [name], OT, [name] left a message asking for verbal orders for HHOT 1wk6, HHRN 1wk1 and HHPT 1wk1.  HHPT orders already given to [place], PT, [name].  In regards to [name] and [name], Medical record reviewed. Social work note reviewed.  Verbal orders given per office protocol.

## 2019-08-14 ENCOUNTER — Telehealth: Payer: Self-pay

## 2019-08-14 MED ORDER — FENTANYL 50 MCG/HR TD PT72: 1.0000 | MEDICATED_PATCH | TRANSDERMAL | 0 refills | Status: DC

## 2019-08-14 NOTE — Telephone Encounter (Signed)
Also Kayla,OT from [facility] called requesting a order for tub transfer bench and to let you know he has a drug interaction between Potassium Chloride and Oxybutrin. Discussed with Dr. Berline Chough and she is aware of the drug interaction, also she stated that she wrote a hand written script for the tub transfer bench and gave to NCM.  Called Kayla to make her aware.

## 2019-08-14 NOTE — Telephone Encounter (Signed)
Refill request for Fentanyl patch.  

## 2019-08-20 ENCOUNTER — Encounter: Payer: Self-pay | Admitting: Plastic Surgery

## 2019-08-20 ENCOUNTER — Ambulatory Visit (INDEPENDENT_AMBULATORY_CARE_PROVIDER_SITE_OTHER): Payer: No Typology Code available for payment source | Admitting: Plastic Surgery

## 2019-08-20 ENCOUNTER — Other Ambulatory Visit: Payer: Self-pay

## 2019-08-20 VITALS — BP 147/90 | HR 74 | Temp 100.0°F | Ht 74.0 in | Wt 151.0 lb

## 2019-08-20 DIAGNOSIS — L089 Local infection of the skin and subcutaneous tissue, unspecified: Secondary | ICD-10-CM

## 2019-08-20 DIAGNOSIS — T148XXA Other injury of unspecified body region, initial encounter: Secondary | ICD-10-CM

## 2019-08-20 NOTE — Progress Notes (Signed)
   Subjective:    Patient ID: Christopher [name], male    DOB: August 12, 1965, 54 y.o.   MRN: [id number]  HPI    Review of Systems  Constitutional: Negative.   HENT: Negative.   Eyes: Negative.   Respiratory: Negative.   Cardiovascular: Negative.   Genitourinary: Negative.   Musculoskeletal: Negative.   Hematological: Negative.        Objective:   Physical Exam Vitals and nursing note reviewed.  Constitutional:      Appearance: Normal appearance.  HENT:     Head: Normocephalic and atraumatic.  Cardiovascular:     Rate and Rhythm: Normal rate.     Pulses: Normal pulses.  Pulmonary:     Effort: Pulmonary effort is normal.  Skin:    Capillary Refill: Capillary refill takes less than 2 seconds.  Neurological:     Mental Status: He is alert. Mental status is at baseline.  Psychiatric:        Mood and Affect: Mood normal.        Assessment & Plan:     ICD-10-CM   1. Wound infection, posttraumatic  T14.8XXA    L08.9     Spoke with Dr. Lorra Hals and spent a message to Dr. Terri Piedra for further discussion.  Plan surgery for excision of the hardware ACell and VAC.  Pictures were obtained of the patient and placed in the chart with the patient's or guardian's permission.

## 2019-08-27 ENCOUNTER — Other Ambulatory Visit: Payer: Self-pay | Admitting: Physical Medicine & Rehabilitation

## 2019-08-27 DIAGNOSIS — F064 Anxiety 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: Secondary | ICD-10-CM

## 2019-08-27 DIAGNOSIS — F329 Major depressive disorder, single episode, unspecified: Secondary | ICD-10-CM

## 2019-08-29 NOTE — Progress Notes (Signed)
ICD-10-CM   1. Chronic osteomyelitis of pelvis, left ([place])  M86.652   2. Wound of left buttock, subsequent encounter  S31.829D       Patient ID: Christopher [name], male    DOB: 1966-04-02, 54 y.o.   MRN: [id number]   History of Present Illness: Christopher [name] is a 54 y.o.  male  with a history of multiple lower extremity and gluteal wounds and chronic osteomyelitis.  He presents for preoperative evaluation for upcoming procedure, irrigation, debridement, and removal of hardware from hip/pelvis with placement of ACell and wound VAC as well as inner upper left thigh wound, scheduled for 09/18/2019 with Dr. Doreatha Martin and Dr. Marla Roe.  Patient presents today with his wife.  Patient has history of difficulty healing this left hip/flank wound which continues to be very painful.  Patient underwent ultrasound-guided aspiration of left flank subcutaneous tissue on 07/04/2019, no discrete pocket of fluid was found, but able to aspirate ~1 cc of fluid directly from the pelvic bone surface.  Culture was negative.  He saw infectious disease, Dr. Drucilla Schmidt, on 07/09/2019 and started on oral antibiotics after finishing his second round of parenteral antibiotics on 07/15/2019.  Patient reports he completed the oral antibiotics approximately 1 month ago.  And has been on no additional antibiotics since then.  Patient is visibly in pain at today's visit.  Patient and wife also report the wound on the inside of his upper left thigh (~2 cm) is still causing pain and will frequently bleed while he is walking.  They would like to address this wound while he is under anesthesia if possible.  The patient has not had problems with anesthesia.   Past Medical History: Allergies: No Known Allergies  Current Medications:  Current Outpatient Medications:  .  acetaminophen (TYLENOL) 325 MG tablet, Take 2 tablets (650 mg total) by mouth every 6 (six) hours as needed for mild pain (or Fever >/= 101)., Disp:  , Rfl:  .   albuterol (VENTOLIN HFA) 108 (90 Base) MCG/ACT inhaler, Inhale 2 puffs into the lungs every 6 (six) hours as needed for wheezing or shortness of breath., Disp: 18 g, Rfl: 1 .  Amino Acids-Protein Hydrolys (FEEDING SUPPLEMENT, PRO-STAT SUGAR FREE 64,) LIQD, Take 30 mLs by mouth 3 (three) times daily with meals., Disp: 887 mL, Rfl: 0 .  azithromycin (ZITHROMAX) 250 MG tablet, Take 1 tablet (250 mg total) by mouth daily. Take first 2 tablets together, then 1 every day until finished., Disp: 4 tablet, Rfl: 0 .  citalopram (CELEXA) 20 MG tablet, TAKE 1 TABLET (20 MG TOTAL) BY MOUTH DAILY. FOR MOOD/ANXIETY (PA PENDING), Disp: 30 tablet, Rfl: 11 .  clonazePAM (KLONOPIN) 0.5 MG tablet, TAKE 1/2 A TABLET BY MOUTH 3 TIMES DAILY AS NEEDED FOR ANXIETY (WC EXPIRED 01/18/2019) (Patient taking differently: Take 0.25 mg by mouth 3 (three) times daily as needed for anxiety. ), Disp: 90 tablet, Rfl: 1 .  [employer] MELATONIN 3 MG TABS, TAKE 1 TABLET BY MOUTH AT BEDTIME *WAITING ON PA (Patient taking differently: Take 3 mg by mouth at bedtime. ), Disp: 30 tablet, Rfl: 2 .  dronabinol (MARINOL) 2.5 MG capsule, TAKE 1 CAPSULE (2.5 MG TOTAL) BY MOUTH 2 (TWO) TIMES DAILY BEFORE A MEAL., Disp: 60 capsule, Rfl: 5 .  fentaNYL (DURAGESIC) 50 MCG/HR, Place 1 patch onto the skin every other day., Disp: 15 patch, Rfl: 0 .  gabapentin (NEURONTIN) 300 MG capsule, TAKE 1 CAPSULE BY MOUTH THREE TIMES A DAY (Patient  taking differently: Take 300 mg by mouth 3 (three) times daily. ), Disp: 90 capsule, Rfl: 5 .  lidocaine (LIDODERM) 5 %, Place 3 patches onto the skin every 12 (twelve) hours. Remove & Discard patch within 12 hours or as directed by MD, Disp: 90 patch, Rfl: 5 .  methocarbamol (ROBAXIN) 500 MG tablet, TAKE 1 TABLET BY MOUTH EVERY 6 HOURS AS NEEDED FOR MUSCLE SPASMS (Patient taking differently: Take 500 mg by mouth every 6 (six) hours as needed for muscle spasms. ), Disp: 100 tablet, Rfl: 1 .  Multiple Vitamin (MULTIVITAMIN WITH MINERALS)  TABS tablet, Take 1 tablet by mouth daily., Disp:  , Rfl:  .  omeprazole (PRILOSEC) 20 MG capsule, Take 20 mg by mouth daily., Disp: , Rfl:  .  ondansetron (ZOFRAN) 4 MG tablet, Take 1-2 tablets (4-8 mg total) by mouth every 8 (eight) hours as needed for nausea, vomiting or refractory nausea / vomiting (max dose in a day is 24 mg total)., Disp: 120 tablet, Rfl: 5 .  oxyCODONE-acetaminophen (PERCOCET) 5-325 MG tablet, Take 1 tablet by mouth every 4 (four) hours as needed for moderate pain or severe pain., Disp: 150 tablet, Rfl: 0 .  polyethylene glycol (MIRALAX / GLYCOLAX) 17 g packet, MIX AND TAKE 17 G BY MOUTH DAILY. (Patient taking differently: Take 17 g by mouth daily. ), Disp: 28 packet, Rfl: 1 .  prochlorperazine (COMPAZINE) 5 MG tablet, Take 1-2 tablets (5-10 mg total) by mouth every 6 (six) hours as needed for nausea., Disp: 60 tablet, Rfl: 0 .  QUEtiapine (SEROQUEL) 25 MG tablet, Take 25 mg by mouth at bedtime., Disp: , Rfl:  .  senna-docusate (SENOKOT-S) 8.6-50 MG tablet, Take 2 tablets by mouth at bedtime., Disp: 60 tablet, Rfl: 1  Past Medical Problems: Past Medical History:  Diagnosis Date  . Acute on chronic respiratory failure with hypoxia (HCC) 06/2018   trach removed 11-16-2018, on vent from jan until may 2020  . Bacteremia due to Pseudomonas 06/2018  . Chronic pain syndrome   . History of kidney stones   . Multiple traumatic injuries   . Wound discharge    left hip wound with bloody/clear drainage change dressing q day surgilube with gauze, between legs wound using calcium algenate pad bid    Past Surgical History: Past Surgical History:  Procedure Laterality Date  . APPLICATION OF A-CELL OF BACK N/A 08/06/2018   Procedure: Application Of A-Cell Of Back;  Surgeon: Dillingham, Claire S, DO;  Location: [facility];  Service: Plastics;  Laterality: N/A;  . APPLICATION OF A-CELL OF EXTREMITY Left 08/06/2018   Procedure: Application Of A-Cell Of Extremity;  Surgeon: Dillingham, Claire  S, DO;  Location: [facility];  Service: Plastics;  Laterality: Left;  . APPLICATION OF WOUND VAC  07/12/2018   Procedure: Application Of Wound Vac to the Left Thigh and Scrotum.;  Surgeon: Haddix, Kevin P, MD;  Location: [facility];  Service: Orthopedics;;  . APPLICATION OF WOUND VAC  07/10/2018   Procedure: Application Of Wound Vac;  Surgeon: Connor, Chelsea A, MD;  Location: [facility];  Service: General;;  . COLOSTOMY N/A 07/23/2018   Procedure: COLOSTOMY;  Surgeon: Thompson, Burke, MD;  [facility]: [facility];  Service: General;  Laterality: N/A;  . CYSTOSCOPY W/ URETERAL STENT PLACEMENT N/A 07/15/2018   Procedure: RETROGRADE URETHROGRAM;  Surgeon: Dahlstedt, Stephen, MD;  [facility]: [facility];  Service: Urology;  Laterality: N/A;  . CYSTOSCOPY WITH LITHOLAPAXY N/A 05/06/2019   Procedure: CYSTOSCOPY BASKET BLADDER STONE EXTRACTION;  Surgeon:   McKenzie, Mardene Celeste, MD;  Location[facility];  Service: Urology;  Laterality: N/A;  30 MINS  . CYSTOSTOMY N/A 05/06/2019   Procedure: REPLACEMENT OF SUPRAPUBIC CATHETER;  Surgeon: Malen Gauze, MD;  Location: [facility];  Service: Urology;  Laterality: N/A;  . DEBRIDEMENT AND CLOSURE WOUND Left 03/04/2019   Procedure: Excision of hip wound with placement of Acell;  Surgeon: Peggye Form, DO;  [facility];  Service: Plastics;  Laterality: Left;  . ESOPHAGOGASTRODUODENOSCOPY N/A 08/14/2018   Procedure: ESOPHAGOGASTRODUODENOSCOPY (EGD);  Surgeon: Violeta Gelinas, MD;  Location: [facility] ENDOSCOPY;  Service: General;  Laterality: N/A;  bedside  . FACIAL RECONSTRUCTION SURGERY     X 2--once as a teenager and second time in his 45's  . HARDWARE REMOVAL Left 03/04/2019   Procedure: Left Hip Hardware Removal;  Surgeon: Roby Lofts, MD;  [facility];  Service: Orthopedics;  Laterality: Left;  . HIP PINNING,CANNULATED Left 07/12/2018   Procedure: CANNULATED HIP PINNING;  Surgeon: Roby Lofts, MD;  [facility];  Service: Orthopedics;   Laterality: Left;  . HIP SURGERY    . HOLMIUM LASER APPLICATION Right 07/15/2019   Procedure: HOLMIUM LASER APPLICATION;  Surgeon: Malen Gauze, MD;  Location[facility];  Service: Urology;  Laterality: Right;  . I & D EXTREMITY Left 07/25/2018   Procedure: Debridement of buttock, scrotum and left leg, placement of acell and vac;  Surgeon: Peggye Form, DO;  [facility];  Service: Plastics;  Laterality: Left;  . I & D EXTREMITY N/A 08/06/2018   Procedure: Debridement of buttock, scrotum and left leg;  Surgeon: Peggye Form, DO;  [facility];  Service: Plastics;  Laterality: N/A;  . I & D EXTREMITY N/A 08/13/2018   Procedure: Debridement of buttock, scrotum and left leg, placement of acell and vac;  Surgeon: Peggye Form, DO;  [facility];  Service: Plastics;  Laterality: N/A;  90 min, please  . INCISION AND DRAINAGE OF WOUND N/A 07/18/2018   Procedure: Debridement of left leg, buttocks and scrotal wound with placement of acell and Flexiseal;  Surgeon: Peggye Form, DO;  [facility];  Service: Plastics;  Laterality: N/A;  . INCISION AND DRAINAGE OF WOUND Left 08/29/2018   Procedure: Debridement of buttock, scrotum and left leg, placement of acell and vac;  Surgeon: Peggye Form, DO;  [facility]: [facility];  Service: Plastics;  Laterality: Left;  75 min, please  . INCISION AND DRAINAGE OF WOUND Bilateral 10/23/2018   Procedure: DEBRIDEMENT OF BUTTOCK,SCROTUM, AND LEG WOUNDS WITH PLACEMENT OF ACELL- BILATERAL 90 MIN;  Surgeon: Peggye Form, DO;  [facility]: [facility];  Service: Plastics;  Laterality: Bilateral;  . IR ANGIOGRAM PELVIS SELECTIVE OR SUPRASELECTIVE  07/10/2018  . IR ANGIOGRAM PELVIS SELECTIVE OR SUPRASELECTIVE  07/10/2018  . IR ANGIOGRAM SELECTIVE EACH ADDITIONAL VESSEL  07/10/2018  . IR EMBO ART  VEN HEMORR LYMPH EXTRAV  INC GUIDE ROADMAPPING  07/10/2018  . IR NEPHROSTOMY PLACEMENT LEFT  04/05/2019  . IR NEPHROSTOMY  PLACEMENT RIGHT  05/31/2019  . IR US GUIDE BX ASP/DRAIN  07/10/2018  . IR US GUIDE VASC ACCESS RIGHT  07/10/2018  . IR VENO/EXT/UNI LEFT  07/10/2018  . IRRIGATION AND DEBRIDEMENT OF WOUND WITH SPLIT THICKNESS SKIN GRAFT Left 09/19/2018   Procedure: Debridement of gluteal wound with placement of acell to left leg.;  Surgeon: Peggye Form, DO;  [facility]: [facility];  Service: Plastics;  Laterality: Left;  2.5 hours, please  . LAPAROTOMY N/A 07/12/2018   Procedure: EXPLORATORY LAPAROTOMY;  Surgeon: Violeta Gelinas, MD;  Location: [facility] OR;  Service: General;  Laterality: N/A;  . LAPAROTOMY N/A 07/15/2018   Procedure: WOUND EXPLORATION; CLOSURE OF ABDOMEN;  Surgeon: Violeta Gelinas, MD;  Location: [facility] OR;  Service: General;  Laterality: N/A;  . LAPAROTOMY  07/10/2018   Procedure: Exploratory Laparotomy;  Surgeon: Berna Bue, MD;  Location: [facility] OR;  Service: General;;  . NEPHROLITHOTOMY Right 07/15/2019   Procedure: NEPHROLITHOTOMY PERCUTANEOUS;  Surgeon: Malen Gauze, MD;  Location: [facility];  Service: Urology;  Laterality: Right;  90 MINS  . PEG PLACEMENT N/A 08/14/2018   Procedure: PERCUTANEOUS ENDOSCOPIC GASTROSTOMY (PEG) PLACEMENT;  Surgeon: Violeta Gelinas, MD;  Location: [facility] ENDOSCOPY;  Service: General;  Laterality: N/A;  . PERCUTANEOUS TRACHEOSTOMY N/A 08/02/2018   Procedure: PERCUTANEOUS TRACHEOSTOMY;  Surgeon: Violeta Gelinas, MD;  Location: [facility];  Service: General;  Laterality: N/A;  . RADIOLOGY WITH ANESTHESIA N/A 07/10/2018   Procedure: IR WITH ANESTHESIA;  Surgeon: Simonne Come, MD;  Location: [facility] OR;  Service: Radiology;  Laterality: N/A;  . RADIOLOGY WITH ANESTHESIA Right 07/10/2018   Procedure: Ir With Anesthesia;  Surgeon: Simonne Come, MD;  Location: [facility] OR;  Service: Radiology;  Laterality: Right;  . SCROTAL EXPLORATION N/A 07/15/2018   Procedure: SCROTUM DEBRIDEMENT;  Surgeon: Marcine Matar, MD;  Location: [facility] OR;  Service: Urology;  Laterality: N/A;  . SHOULDER  SURGERY    . SKIN SPLIT GRAFT Right 09/19/2018   Procedure: Skin Graft Split Thickness;  Surgeon: Peggye Form, DO;  [facility];  Service: Plastics;  Laterality: Right;  . SKIN SPLIT GRAFT N/A 10/03/2018   Procedure: Split thickness skin graft to gluteal area with acell placement;  Surgeon: Peggye Form, DO;  Location: [facility];  Service: Plastics;  Laterality: N/A;  3 hours, please  . VACUUM ASSISTED CLOSURE CHANGE N/A 07/12/2018   Procedure: ABDOMINAL VACUUM ASSISTED CLOSURE CHANGE and abdominal washout;  Surgeon: Violeta Gelinas, MD;  Location: [facility] OR;  Service: General;  Laterality: N/A;  . WOUND DEBRIDEMENT Left 07/23/2018   Procedure: DEBRIDEMENT LEFT BUTTOCK  WOUND;  Surgeon: Violeta Gelinas, MD;  Location: [facility] OR;  Service: General;  Laterality: Left;  . WOUND EXPLORATION Left 07/10/2018   Procedure: WOUND EXPLORATION LEFT GROIN;  Surgeon: Berna Bue, MD;  Location: [facility];  Service: General;  Laterality: Left;    Social History: Social History   Socioeconomic History  . Marital status: Married    Spouse name: Not on file  . Number of children: Not on file  . Years of education: Not on file  . Highest education level: Not on file  Occupational History  . Not on file  Tobacco Use  . Smoking status: Former Smoker    Packs/day: 1.00    Years: 20.00    Pack years: 20.00    Types: Cigarettes    Quit date: 07/10/2018    Years since quitting: 1.1  . Smokeless tobacco: Never Used  Substance and Sexual Activity  . Alcohol use: Never  . Drug use: Never  . Sexual activity: Yes  Other Topics Concern  . Not on file  Social History Narrative   ** Merged History Encounter **       Social Determinants of Health   Financial Resource Strain: Low Risk   . Difficulty of Paying Living Expenses: Not very hard  Food Insecurity: Food Insecurity Present  .  Worried About Programme researcher, broadcasting/film/videounning Out of Food in the Last Year: Sometimes true  . Ran Out of Food in the Last Year: Sometimes  true  Transportation Needs: No Transportation Needs  . Lack of Transportation (Medical): No  . Lack of Transportation (Non-Medical): No  Physical Activity: Inactive  . Days of Exercise per Week: 0 days  . Minutes of Exercise per Session: 0 min  Stress: Stress Concern Present  . Feeling of Stress : Rather much  Social Connections:   . Frequency of Communication with Friends and Family:   . Frequency of Social Gatherings with Friends and Family:   . Attends Religious Services:   . Active Member of Clubs or Organizations:   . Attends BankerClub or Organization Meetings:   Marland Kitchen. Marital Status:   Intimate Partner Violence:   . Fear of Current or Ex-Partner:   . Emotionally Abused:   Marland Kitchen. Physically Abused:   . Sexually Abused:     Family History: Family History  Problem Relation Age of Onset  . Breast cancer Mother        with mets to the bones    Review of Systems: Review of Systems  Constitutional: Negative for chills and fever.  HENT: Negative for congestion and sore throat.   Respiratory: Negative for shortness of breath.   Cardiovascular: Negative for chest pain and palpitations.  Gastrointestinal: Negative for abdominal pain, nausea and vomiting.  Musculoskeletal:       Significant pain of left hip/flank wound. Wound continues to not heal. Pain and occassionaly bleeding from wound on inner upper left thigh.  Skin: Negative for itching and rash.    Physical Exam: Vital Signs BP 130/80 (BP Location: Left Arm, Patient Position: Sitting, Cuff Size: Normal)   Pulse 89   Temp 98.1 F (36.7 C) (Temporal)   Ht 6\' 2"  (1.88 m)   Wt 151 lb (68.5 kg)   SpO2 97%   BMI 19.39 kg/m  Physical Exam Constitutional:      Appearance: Normal appearance.     Comments: Visibly in pain from left hip/flank wound.  HENT:     Head: Normocephalic and atraumatic.  Eyes:     Extraocular Movements: Extraocular movements intact.  Cardiovascular:     Rate and Rhythm: Normal rate.  Pulmonary:      Effort: Pulmonary effort is normal.     Breath sounds: Normal breath sounds. No stridor. No wheezing, rhonchi or rales.  Abdominal:     General: Bowel sounds are normal.     Palpations: Abdomen is soft.  Musculoskeletal:        General: Normal range of motion.     Cervical back: Normal range of motion.  Skin:    General: Skin is warm and dry.  Neurological:     General: No focal deficit present.     Mental Status: He is alert and oriented to person, place, and time.  Psychiatric:        Mood and Affect: Mood normal.        Behavior: Behavior normal.        Thought Content: Thought content normal.        Judgment: Judgment normal.     Assessment/Plan:  Mr. Hollace KinnierBrownfield scheduled for irrigation, debridement, and removal of hardware from hip/pelvis with placement of ACell and wound VAC as well as inner upper left thigh wound, scheduled for 09/18/2019 with Dr. Jena GaussHaddix and Dr. Ulice Boldillingham. Risks, benefits, and alternatives of procedure discussed, questions answered and consent obtained.  Smoking Status: non-smoker  Caprini Score: 4 Moderate; Risk Factors include: 54 yr-old male, osteomyelitis, and length of planned surgery. Recommendation for mechanical or pharmacological prophylaxis during surgery. Encourage early ambulation.   Pictures obtained: 07/17/19  The 21st Century Cures Act was signed into law in 2016 which includes the topic of electronic health records.  This provides immediate access to information in MyChart.  This includes consultation notes, operative notes, office notes, lab results and pathology reports.  If you have any questions about what you read please let us know at your next visit or call us at the office.  We are right here with you.   Electronically signed by: Eldridge Abrahams, PA-C 08/30/2019 1:00 PM

## 2019-08-29 NOTE — H&P (View-Only) (Signed)
ICD-10-CM   1. Chronic osteomyelitis of pelvis, left ([place])  M86.652   2. Wound of left buttock, subsequent encounter  S31.829D       Patient ID: Christopher [name], male    DOB: 1966-04-02, 54 y.o.   MRN: [id number]   History of Present Illness: Christopher [name] is a 54 y.o.  male  with a history of multiple lower extremity and gluteal wounds and chronic osteomyelitis.  He presents for preoperative evaluation for upcoming procedure, irrigation, debridement, and removal of hardware from hip/pelvis with placement of ACell and wound VAC as well as inner upper left thigh wound, scheduled for 09/18/2019 with Dr. Doreatha Martin and Dr. Marla Roe.  Patient presents today with his wife.  Patient has history of difficulty healing this left hip/flank wound which continues to be very painful.  Patient underwent ultrasound-guided aspiration of left flank subcutaneous tissue on 07/04/2019, no discrete pocket of fluid was found, but able to aspirate ~1 cc of fluid directly from the pelvic bone surface.  Culture was negative.  He saw infectious disease, Dr. Drucilla Schmidt, on 07/09/2019 and started on oral antibiotics after finishing his second round of parenteral antibiotics on 07/15/2019.  Patient reports he completed the oral antibiotics approximately 1 month ago.  And has been on no additional antibiotics since then.  Patient is visibly in pain at today's visit.  Patient and wife also report the wound on the inside of his upper left thigh (~2 cm) is still causing pain and will frequently bleed while he is walking.  They would like to address this wound while he is under anesthesia if possible.  The patient has not had problems with anesthesia.   Past Medical History: Allergies: No Known Allergies  Current Medications:  Current Outpatient Medications:  .  acetaminophen (TYLENOL) 325 MG tablet, Take 2 tablets (650 mg total) by mouth every 6 (six) hours as needed for mild pain (or Fever >/= 101)., Disp:  , Rfl:  .   albuterol (VENTOLIN HFA) 108 (90 Base) MCG/ACT inhaler, Inhale 2 puffs into the lungs every 6 (six) hours as needed for wheezing or shortness of breath., Disp: 18 g, Rfl: 1 .  Amino Acids-Protein Hydrolys (FEEDING SUPPLEMENT, PRO-STAT SUGAR FREE 64,) LIQD, Take 30 mLs by mouth 3 (three) times daily with meals., Disp: 887 mL, Rfl: 0 .  azithromycin (ZITHROMAX) 250 MG tablet, Take 1 tablet (250 mg total) by mouth daily. Take first 2 tablets together, then 1 every day until finished., Disp: 4 tablet, Rfl: 0 .  citalopram (CELEXA) 20 MG tablet, TAKE 1 TABLET (20 MG TOTAL) BY MOUTH DAILY. FOR MOOD/ANXIETY (PA PENDING), Disp: 30 tablet, Rfl: 11 .  clonazePAM (KLONOPIN) 0.5 MG tablet, TAKE 1/2 A TABLET BY MOUTH 3 TIMES DAILY AS NEEDED FOR ANXIETY (WC EXPIRED 01/18/2019) (Patient taking differently: Take 0.25 mg by mouth 3 (three) times daily as needed for anxiety. ), Disp: 90 tablet, Rfl: 1 .  [facility] MELATONIN 3 MG TABS, TAKE 1 TABLET BY MOUTH AT BEDTIME *WAITING ON PA (Patient taking differently: Take 3 mg by mouth at bedtime. ), Disp: 30 tablet, Rfl: 2 .  dronabinol (MARINOL) 2.5 MG capsule, TAKE 1 CAPSULE (2.5 MG TOTAL) BY MOUTH 2 (TWO) TIMES DAILY BEFORE A MEAL., Disp: 60 capsule, Rfl: 5 .  fentaNYL (DURAGESIC) 50 MCG/HR, Place 1 patch onto the skin every other day., Disp: 15 patch, Rfl: 0 .  gabapentin (NEURONTIN) 300 MG capsule, TAKE 1 CAPSULE BY MOUTH THREE TIMES A DAY (Patient  taking differently: Take 300 mg by mouth 3 (three) times daily. ), Disp: 90 capsule, Rfl: 5 .  lidocaine (LIDODERM) 5 %, Place 3 patches onto the skin every 12 (twelve) hours. Remove & Discard patch within 12 hours or as directed by MD, Disp: 90 patch, Rfl: 5 .  methocarbamol (ROBAXIN) 500 MG tablet, TAKE 1 TABLET BY MOUTH EVERY 6 HOURS AS NEEDED FOR MUSCLE SPASMS (Patient taking differently: Take 500 mg by mouth every 6 (six) hours as needed for muscle spasms. ), Disp: 100 tablet, Rfl: 1 .  Multiple Vitamin (MULTIVITAMIN WITH MINERALS)  TABS tablet, Take 1 tablet by mouth daily., Disp:  , Rfl:  .  omeprazole (PRILOSEC) 20 MG capsule, Take 20 mg by mouth daily., Disp: , Rfl:  .  ondansetron (ZOFRAN) 4 MG tablet, Take 1-2 tablets (4-8 mg total) by mouth every 8 (eight) hours as needed for nausea, vomiting or refractory nausea / vomiting (max dose in a day is 24 mg total)., Disp: 120 tablet, Rfl: 5 .  oxyCODONE-acetaminophen (PERCOCET) 5-325 MG tablet, Take 1 tablet by mouth every 4 (four) hours as needed for moderate pain or severe pain., Disp: 150 tablet, Rfl: 0 .  polyethylene glycol (MIRALAX / GLYCOLAX) 17 g packet, MIX AND TAKE 17 G BY MOUTH DAILY. (Patient taking differently: Take 17 g by mouth daily. ), Disp: 28 packet, Rfl: 1 .  prochlorperazine (COMPAZINE) 5 MG tablet, Take 1-2 tablets (5-10 mg total) by mouth every 6 (six) hours as needed for nausea., Disp: 60 tablet, Rfl: 0 .  QUEtiapine (SEROQUEL) 25 MG tablet, Take 25 mg by mouth at bedtime., Disp: , Rfl:  .  senna-docusate (SENOKOT-S) 8.6-50 MG tablet, Take 2 tablets by mouth at bedtime., Disp: 60 tablet, Rfl: 1  Past Medical Problems: Past Medical History:  Diagnosis Date  . Acute on chronic respiratory failure with hypoxia (HCC) 06/2018   trach removed 11-16-2018, on vent from jan until may 2020  . Bacteremia due to Pseudomonas 06/2018  . Chronic pain syndrome   . History of kidney stones   . Multiple traumatic injuries   . Wound discharge    left hip wound with bloody/clear drainage change dressing q day surgilube with gauze, between legs wound using calcium algenate pad bid    Past Surgical History: Past Surgical History:  Procedure Laterality Date  . APPLICATION OF A-CELL OF BACK N/A 08/06/2018   Procedure: Application Of A-Cell Of Back;  Surgeon: Peggye Form, DO;  [facility];  Service: Plastics;  Laterality: N/A;  . APPLICATION OF A-CELL OF EXTREMITY Left 08/06/2018   Procedure: Application Of A-Cell Of Extremity;  Surgeon: Peggye Form, DO;  [facility];  Service: Plastics;  Laterality: Left;  . APPLICATION OF WOUND VAC  07/12/2018   Procedure: Application Of Wound Vac to the Left Thigh and Scrotum.;  Surgeon: Roby Lofts, MD;  Location: [facility] OR;  Service: Orthopedics;;  . APPLICATION OF WOUND VAC  07/10/2018   Procedure: Application Of Wound Vac;  Surgeon: Berna Bue, MD;  Location: [facility] OR;  Service: General;;  . COLOSTOMY N/A 07/23/2018   Procedure: COLOSTOMY;  Surgeon: Violeta Gelinas, MD;  Location: [facility] OR;  Service: General;  Laterality: N/A;  . CYSTOSCOPY W/ URETERAL STENT PLACEMENT N/A 07/15/2018   Procedure: RETROGRADE URETHROGRAM;  Surgeon: Marcine Matar, MD;  Location: [facility] OR;  Service: Urology;  Laterality: N/A;  . CYSTOSCOPY WITH LITHOLAPAXY N/A 05/06/2019   Procedure: CYSTOSCOPY BASKET BLADDER STONE EXTRACTION;  Surgeon:  McKenzie, Mardene Celeste, MD;  Location: [facility];  Service: Urology;  Laterality: N/A;  30 MINS  . CYSTOSTOMY N/A 05/06/2019   Procedure: REPLACEMENT OF SUPRAPUBIC CATHETER;  Surgeon: Malen Gauze, MD;  Location: [facility];  Service: Urology;  Laterality: N/A;  . DEBRIDEMENT AND CLOSURE WOUND Left 03/04/2019   Procedure: Excision of hip wound with placement of Acell;  Surgeon: Peggye Form, DO;  [facility];  Service: Plastics;  Laterality: Left;  . ESOPHAGOGASTRODUODENOSCOPY N/A 08/14/2018   Procedure: ESOPHAGOGASTRODUODENOSCOPY (EGD);  Surgeon: Violeta Gelinas, MD;  Location: [facility] ENDOSCOPY;  Service: General;  Laterality: N/A;  bedside  . FACIAL RECONSTRUCTION SURGERY     X 2--once as a teenager and second time in his 45's  . HARDWARE REMOVAL Left 03/04/2019   Procedure: Left Hip Hardware Removal;  Surgeon: Roby Lofts, MD;  [facility];  Service: Orthopedics;  Laterality: Left;  . HIP PINNING,CANNULATED Left 07/12/2018   Procedure: CANNULATED HIP PINNING;  Surgeon: Roby Lofts, MD;  [facility];  Service: Orthopedics;   Laterality: Left;  . HIP SURGERY    . HOLMIUM LASER APPLICATION Right 07/15/2019   Procedure: HOLMIUM LASER APPLICATION;  Surgeon: Malen Gauze, MD;  Location: [facility];  Service: Urology;  Laterality: Right;  . I & D EXTREMITY Left 07/25/2018   Procedure: Debridement of buttock, scrotum and left leg, placement of acell and vac;  Surgeon: Peggye Form, DO;  [facility];  Service: Plastics;  Laterality: Left;  . I & D EXTREMITY N/A 08/06/2018   Procedure: Debridement of buttock, scrotum and left leg;  Surgeon: Peggye Form, DO;  [facility];  Service: Plastics;  Laterality: N/A;  . I & D EXTREMITY N/A 08/13/2018   Procedure: Debridement of buttock, scrotum and left leg, placement of acell and vac;  Surgeon: Peggye Form, DO;  [facility];  Service: Plastics;  Laterality: N/A;  90 min, please  . INCISION AND DRAINAGE OF WOUND N/A 07/18/2018   Procedure: Debridement of left leg, buttocks and scrotal wound with placement of acell and Flexiseal;  Surgeon: Peggye Form, DO;  [facility];  Service: Plastics;  Laterality: N/A;  . INCISION AND DRAINAGE OF WOUND Left 08/29/2018   Procedure: Debridement of buttock, scrotum and left leg, placement of acell and vac;  Surgeon: Peggye Form, DO;  [facility];  Service: Plastics;  Laterality: Left;  75 min, please  . INCISION AND DRAINAGE OF WOUND Bilateral 10/23/2018   Procedure: DEBRIDEMENT OF BUTTOCK,SCROTUM, AND LEG WOUNDS WITH PLACEMENT OF ACELL- BILATERAL 90 MIN;  Surgeon: Peggye Form, DO;  [facility];  Service: Plastics;  Laterality: Bilateral;  . IR ANGIOGRAM PELVIS SELECTIVE OR SUPRASELECTIVE  07/10/2018  . IR ANGIOGRAM PELVIS SELECTIVE OR SUPRASELECTIVE  07/10/2018  . IR ANGIOGRAM SELECTIVE EACH ADDITIONAL VESSEL  07/10/2018  . IR EMBO ART  VEN HEMORR LYMPH EXTRAV  INC GUIDE ROADMAPPING  07/10/2018  . IR NEPHROSTOMY PLACEMENT LEFT  04/05/2019  . IR NEPHROSTOMY  PLACEMENT RIGHT  05/31/2019  . IR US GUIDE BX ASP/DRAIN  07/10/2018  . IR US GUIDE VASC ACCESS RIGHT  07/10/2018  . IR VENO/EXT/UNI LEFT  07/10/2018  . IRRIGATION AND DEBRIDEMENT OF WOUND WITH SPLIT THICKNESS SKIN GRAFT Left 09/19/2018   Procedure: Debridement of gluteal wound with placement of acell to left leg.;  Surgeon: Peggye Form, DO;  [facility];  Service: Plastics;  Laterality: Left;  2.5 hours, please  . LAPAROTOMY N/A 07/12/2018   Procedure: EXPLORATORY LAPAROTOMY;  Surgeon: Violeta Gelinas, MD;  Location: [facility] OR;  Service: General;  Laterality: N/A;  . LAPAROTOMY N/A 07/15/2018   Procedure: WOUND EXPLORATION; CLOSURE OF ABDOMEN;  Surgeon: Violeta Gelinas, MD;  Location: [facility] OR;  Service: General;  Laterality: N/A;  . LAPAROTOMY  07/10/2018   Procedure: Exploratory Laparotomy;  Surgeon: Berna Bue, MD;  Location: [facility] OR;  Service: General;;  . NEPHROLITHOTOMY Right 07/15/2019   Procedure: NEPHROLITHOTOMY PERCUTANEOUS;  Surgeon: Malen Gauze, MD;  Location: [facility];  Service: Urology;  Laterality: Right;  90 MINS  . PEG PLACEMENT N/A 08/14/2018   Procedure: PERCUTANEOUS ENDOSCOPIC GASTROSTOMY (PEG) PLACEMENT;  Surgeon: Violeta Gelinas, MD;  Location: [facility] ENDOSCOPY;  Service: General;  Laterality: N/A;  . PERCUTANEOUS TRACHEOSTOMY N/A 08/02/2018   Procedure: PERCUTANEOUS TRACHEOSTOMY;  Surgeon: Violeta Gelinas, MD;  Location: [facility] OR;  Service: General;  Laterality: N/A;  . RADIOLOGY WITH ANESTHESIA N/A 07/10/2018   Procedure: IR WITH ANESTHESIA;  Surgeon: Simonne Come, MD;  Location: [facility] OR;  Service: Radiology;  Laterality: N/A;  . RADIOLOGY WITH ANESTHESIA Right 07/10/2018   Procedure: Ir With Anesthesia;  Surgeon: Simonne Come, MD;  Location: [facility] OR;  Service: Radiology;  Laterality: Right;  . SCROTAL EXPLORATION N/A 07/15/2018   Procedure: SCROTUM DEBRIDEMENT;  Surgeon: Marcine Matar, MD;  Location: [facility] OR;  Service: Urology;  Laterality: N/A;  . SHOULDER  SURGERY    . SKIN SPLIT GRAFT Right 09/19/2018   Procedure: Skin Graft Split Thickness;  Surgeon: Peggye Form, DO;  [facility];  Service: Plastics;  Laterality: Right;  . SKIN SPLIT GRAFT N/A 10/03/2018   Procedure: Split thickness skin graft to gluteal area with acell placement;  Surgeon: Peggye Form, DO;  [facility];  Service: Plastics;  Laterality: N/A;  3 hours, please  . VACUUM ASSISTED CLOSURE CHANGE N/A 07/12/2018   Procedure: ABDOMINAL VACUUM ASSISTED CLOSURE CHANGE and abdominal washout;  Surgeon: Violeta Gelinas, MD;  Location: [facility] OR;  Service: General;  Laterality: N/A;  . WOUND DEBRIDEMENT Left 07/23/2018   Procedure: DEBRIDEMENT LEFT BUTTOCK  WOUND;  Surgeon: Violeta Gelinas, MD;  Location: [facility] OR;  Service: General;  Laterality: Left;  . WOUND EXPLORATION Left 07/10/2018   Procedure: WOUND EXPLORATION LEFT GROIN;  Surgeon: Berna Bue, MD;  Location: [facility] OR;  Service: General;  Laterality: Left;    Social History: Social History   Socioeconomic History  . Marital status: Married    Spouse name: Not on file  . Number of children: Not on file  . Years of education: Not on file  . Highest education level: Not on file  Occupational History  . Not on file  Tobacco Use  . Smoking status: Former Smoker    Packs/day: 1.00    Years: 20.00    Pack years: 20.00    Types: Cigarettes    Quit date: 07/10/2018    Years since quitting: 1.1  . Smokeless tobacco: Never Used  Substance and Sexual Activity  . Alcohol use: Never  . Drug use: Never  . Sexual activity: Yes  Other Topics Concern  . Not on file  Social History Narrative   ** Merged History Encounter **       Social Determinants of Health   Financial Resource Strain: Low Risk   . Difficulty of Paying Living Expenses: Not very hard  Food Insecurity: Food Insecurity Present  .  Worried About Programme researcher, broadcasting/film/videounning Out of Food in the Last Year: Sometimes true  . Ran Out of Food in the Last Year: Sometimes  true  Transportation Needs: No Transportation Needs  . Lack of Transportation (Medical): No  . Lack of Transportation (Non-Medical): No  Physical Activity: Inactive  . Days of Exercise per Week: 0 days  . Minutes of Exercise per Session: 0 min  Stress: Stress Concern Present  . Feeling of Stress : Rather much  Social Connections:   . Frequency of Communication with Friends and Family:   . Frequency of Social Gatherings with Friends and Family:   . Attends Religious Services:   . Active Member of Clubs or Organizations:   . Attends BankerClub or Organization Meetings:   Marland Kitchen. Marital Status:   Intimate Partner Violence:   . Fear of Current or Ex-Partner:   . Emotionally Abused:   Marland Kitchen. Physically Abused:   . Sexually Abused:     Family History: Family History  Problem Relation Age of Onset  . Breast cancer Mother        with mets to the bones    Review of Systems: Review of Systems  Constitutional: Negative for chills and fever.  HENT: Negative for congestion and sore throat.   Respiratory: Negative for shortness of breath.   Cardiovascular: Negative for chest pain and palpitations.  Gastrointestinal: Negative for abdominal pain, nausea and vomiting.  Musculoskeletal:       Significant pain of left hip/flank wound. Wound continues to not heal. Pain and occassionaly bleeding from wound on inner upper left thigh.  Skin: Negative for itching and rash.    Physical Exam: Vital Signs BP 130/80 (BP Location: Left Arm, Patient Position: Sitting, Cuff Size: Normal)   Pulse 89   Temp 98.1 F (36.7 C) (Temporal)   Ht 6\' 2"  (1.88 m)   Wt 151 lb (68.5 kg)   SpO2 97%   BMI 19.39 kg/m  Physical Exam Constitutional:      Appearance: Normal appearance.     Comments: Visibly in pain from left hip/flank wound.  HENT:     Head: Normocephalic and atraumatic.  Eyes:     Extraocular Movements: Extraocular movements intact.  Cardiovascular:     Rate and Rhythm: Normal rate.  Pulmonary:      Effort: Pulmonary effort is normal.     Breath sounds: Normal breath sounds. No stridor. No wheezing, rhonchi or rales.  Abdominal:     General: Bowel sounds are normal.     Palpations: Abdomen is soft.  Musculoskeletal:        General: Normal range of motion.     Cervical back: Normal range of motion.  Skin:    General: Skin is warm and dry.  Neurological:     General: No focal deficit present.     Mental Status: He is alert and oriented to person, place, and time.  Psychiatric:        Mood and Affect: Mood normal.        Behavior: Behavior normal.        Thought Content: Thought content normal.        Judgment: Judgment normal.     Assessment/Plan:  Mr. Hollace KinnierBrownfield scheduled for irrigation, debridement, and removal of hardware from hip/pelvis with placement of ACell and wound VAC as well as inner upper left thigh wound, scheduled for 09/18/2019 with Dr. Jena GaussHaddix and Dr. Ulice Boldillingham. Risks, benefits, and alternatives of procedure discussed, questions answered and consent obtained.  Smoking Status: non-smoker  Caprini Score: 4 Moderate; Risk Factors include: 54 yr-old male, osteomyelitis, and length of planned surgery. Recommendation for mechanical or pharmacological prophylaxis during surgery. Encourage early ambulation.   Pictures obtained: 07/17/19  The 21st Century Cures Act was signed into law in 2016 which includes the topic of electronic health records.  This provides immediate access to information in MyChart.  This includes consultation notes, operative notes, office notes, lab results and pathology reports.  If you have any questions about what you read please let us know at your next visit or call us at the office.  We are right here with you.   Electronically signed by: Eldridge Abrahams, PA-C 08/30/2019 1:00 PM

## 2019-08-30 ENCOUNTER — Encounter: Payer: Self-pay | Admitting: Plastic Surgery

## 2019-08-30 ENCOUNTER — Ambulatory Visit (INDEPENDENT_AMBULATORY_CARE_PROVIDER_SITE_OTHER): Payer: No Typology Code available for payment source | Admitting: Plastic Surgery

## 2019-08-30 ENCOUNTER — Other Ambulatory Visit: Payer: Self-pay

## 2019-08-30 VITALS — BP 130/80 | HR 89 | Temp 98.1°F | Ht 74.0 in | Wt 151.0 lb

## 2019-08-30 DIAGNOSIS — M86652 Other chronic osteomyelitis, left thigh: Secondary | ICD-10-CM

## 2019-08-30 DIAGNOSIS — S31829D Unspecified open wound of left buttock, subsequent encounter: Secondary | ICD-10-CM

## 2019-09-04 ENCOUNTER — Telehealth: Payer: Self-pay | Admitting: *Deleted

## 2019-09-04 NOTE — Telephone Encounter (Addendum)
Laretta Bolster, patient's nurse case manager called for advice. He is scheduled for hospital admission 4/7 for surgery, is scheduled to see Dr Daiva Eves 3/30 in clinic. Arline Asp is wondering if it would be ok for ID to see patient while he is hospitalized rather than the week before in clinic, as it is difficulty to bring patient to visits at this time. Please advise. Andree Coss, RN

## 2019-09-05 NOTE — Telephone Encounter (Signed)
Left message with Dr Zenaida Niece Dam's response, cancelled appointment 3/30. Per amion, Dr Orvan Falconer will be at [facility]. Andree Coss, RN

## 2019-09-05 NOTE — Telephone Encounter (Signed)
That is fine as long as they can ask MD in the hospital to notify who is on in the hospital. It wont be me on the 7th

## 2019-09-10 ENCOUNTER — Ambulatory Visit: Payer: Self-pay | Admitting: Infectious Disease

## 2019-09-10 NOTE — Pre-Procedure Instructions (Signed)
[id number] [place], [place] - [address] [place] [address] Phone: [phone] Fax: [phone]  [id number] [place], [place] - [address] AT [facility] OF [address] [place] [address] Phone: [phone] Fax: [phone]      Your procedure is scheduled on Wednesday, April 7th, from 08:30 AM to 12:00 PM.  Report to [facility] Main Entrance "A" at 06:30 A.M., and check in at the Admitting office.  Call this number if you have problems the morning of surgery:  [phone]  Call [phone] if you have any questions prior to your surgery date Monday-Friday 8am-4pm.    Remember:  Do not eat or drink after midnight the night before your surgery.     Take these medicines the morning of surgery with A SIP OF WATER : citalopram (CELEXA) gabapentin (NEURONTIN)  omeprazole (PRILOSEC)  IF NEEDED: acetaminophen (TYLENOL) clonazePAM (KLONOPIN) methocarbamol (ROBAXIN) ondansetron (ZOFRAN)  prochlorperazine (COMPAZINE)  oxyCODONE-acetaminophen (PERCOCET) albuterol (VENTOLIN HFA) inhaler  *Please bring all inhalers with you the day of surgery.*   As of today, STOP taking any Aspirin (unless otherwise instructed by your surgeon) and Aspirin containing products, Aleve, Naproxen, Ibuprofen, Motrin, Advil, Goody's, BC's, all herbal medications, fish oil, and all vitamins.                      Do not wear jewelry.            Do not wear lotions, powders, colognes, or deodorant.            Men may shave face and neck.            Do not bring valuables to the hospital.            [facility] is not responsible for any belongings or valuables.  Do NOT Smoke (Tobacco/Vapping) or drink Alcohol 24 hours prior to your procedure If you use a CPAP at night, you may bring all equipment for your overnight stay.   Contacts, glasses, dentures or bridgework may not be worn into surgery.       For patients admitted to the hospital, discharge time will be determined by your treatment team.   Patients discharged the day of surgery will not be allowed to drive home, and someone needs to stay with them for 24 hours.    Special instructions:   [facility]- Preparing For Surgery  Before surgery, you can play an important role. Because skin is not sterile, your skin needs to be as free of germs as possible. You can reduce the number of germs on your skin by washing with CHG (chlorahexidine gluconate) Soap before surgery.  CHG is an antiseptic cleaner which kills germs and bonds with the skin to continue killing germs even after washing.    Oral Hygiene is also important to reduce your risk of infection.  Remember - BRUSH YOUR TEETH THE MORNING OF SURGERY WITH YOUR REGULAR TOOTHPASTE  Please do not use if you have an allergy to CHG or antibacterial soaps. If your skin becomes reddened/irritated stop using the CHG.  Do not shave (including legs and underarms) for at least 48 hours prior to first CHG shower. It is OK to shave your face.  Please follow these instructions carefully.   1. Shower the NIGHT BEFORE SURGERY and the MORNING OF SURGERY with CHG Soap.   2. If you chose to wash your  hair, wash your hair first as usual with your normal shampoo.  3. After you shampoo, rinse your hair and body thoroughly to remove the shampoo.  4. Use CHG as you would any other liquid soap. You can apply CHG directly to the skin and wash gently with a scrungie or a clean washcloth.   5. Apply the CHG Soap to your body ONLY FROM THE NECK DOWN.  Do not use on open wounds or open sores. Avoid contact with your eyes, ears, mouth and genitals (private parts). Wash Face and genitals (private parts)  with your normal soap.   6. Wash thoroughly, paying special attention to the area where your surgery will be performed.  7. Thoroughly rinse your body with warm water from the neck down.  8. DO NOT  shower/wash with your normal soap after using and rinsing off the CHG Soap.  9. Pat yourself dry with a CLEAN TOWEL.  10. Wear CLEAN PAJAMAS to bed the night before surgery, wear comfortable clothes the morning of surgery  11. Place CLEAN SHEETS on your bed the night of your first shower and DO NOT SLEEP WITH PETS.   Day of Surgery:   Do not apply any deodorants/lotions.  Please wear clean clothes to the hospital/surgery center.   Remember to brush your teeth WITH YOUR REGULAR TOOTHPASTE.   Please read over the following fact sheets that you were given.

## 2019-09-10 NOTE — Progress Notes (Signed)
No orders in by Dr. Jena Gauss for upcoming sx 09/18/19. Called his office for orders since patient will be coming in 09/11/19 at 1300 for PAT appointment.

## 2019-09-11 ENCOUNTER — Encounter (HOSPITAL_COMMUNITY)
Admission: RE | Admit: 2019-09-11 | Discharge: 2019-09-11 | Disposition: A | Discharge status: Home / Self Care | Payer: No Typology Code available for payment source | Source: Ambulatory Visit | Attending: Student | Admitting: Student

## 2019-09-11 ENCOUNTER — Other Ambulatory Visit: Payer: Self-pay

## 2019-09-11 ENCOUNTER — Encounter (HOSPITAL_COMMUNITY): Payer: Self-pay

## 2019-09-11 DIAGNOSIS — S31000D Unspecified open wound of lower back and pelvis without penetration into retroperitoneum, subsequent encounter: Secondary | ICD-10-CM

## 2019-09-11 DIAGNOSIS — S381XXD Crushing injury of abdomen, lower back, and pelvis, subsequent encounter: Secondary | ICD-10-CM

## 2019-09-11 DIAGNOSIS — Z433 Encounter for attention to colostomy: Secondary | ICD-10-CM

## 2019-09-11 DIAGNOSIS — Z945 Skin transplant status: Secondary | ICD-10-CM

## 2019-09-11 DIAGNOSIS — Z01812 Encounter for preprocedural laboratory examination: Secondary | ICD-10-CM | POA: Diagnosis present

## 2019-09-11 DIAGNOSIS — S31819D Unspecified open wound of right buttock, subsequent encounter: Secondary | ICD-10-CM

## 2019-09-11 DIAGNOSIS — F418 Other specified anxiety disorders: Secondary | ICD-10-CM

## 2019-09-11 DIAGNOSIS — S32811D Multiple fractures of pelvis with unstable disruption of pelvic ring, subsequent encounter for fracture with routine healing: Secondary | ICD-10-CM

## 2019-09-11 DIAGNOSIS — N202 Calculus of kidney with calculus of ureter: Secondary | ICD-10-CM

## 2019-09-11 DIAGNOSIS — E43 Unspecified severe protein-calorie malnutrition: Secondary | ICD-10-CM

## 2019-09-11 DIAGNOSIS — R1310 Dysphagia, unspecified: Secondary | ICD-10-CM

## 2019-09-11 DIAGNOSIS — Z4800 Encounter for change or removal of nonsurgical wound dressing: Secondary | ICD-10-CM

## 2019-09-11 DIAGNOSIS — Z435 Encounter for attention to cystostomy: Secondary | ICD-10-CM

## 2019-09-11 DIAGNOSIS — J9611 Chronic respiratory failure with hypoxia: Secondary | ICD-10-CM

## 2019-09-11 DIAGNOSIS — L89152 Pressure ulcer of sacral region, stage 2: Secondary | ICD-10-CM

## 2019-09-11 DIAGNOSIS — Z436 Encounter for attention to other artificial openings of urinary tract: Secondary | ICD-10-CM

## 2019-09-11 DIAGNOSIS — S3802XD Crushing injury of scrotum and testis, subsequent encounter: Secondary | ICD-10-CM

## 2019-09-11 DIAGNOSIS — M86652 Other chronic osteomyelitis, left thigh: Secondary | ICD-10-CM

## 2019-09-11 DIAGNOSIS — E638 Other specified nutritional deficiencies: Secondary | ICD-10-CM

## 2019-09-11 DIAGNOSIS — Z466 Encounter for fitting and adjustment of urinary device: Secondary | ICD-10-CM

## 2019-09-11 HISTORY — DX: Anxiety disorder, unspecified: F41.9

## 2019-09-11 LAB — CBC
HCT: 43.8 % (ref 39.0–52.0)
Hemoglobin: 13.8 g/dL (ref 13.0–17.0)
MCH: 31.9 pg (ref 26.0–34.0)
MCHC: 31.5 g/dL (ref 30.0–36.0)
MCV: 101.4 fL — ABNORMAL HIGH (ref 80.0–100.0)
Platelets: 293 10*3/uL (ref 150–400)
RBC: 4.32 MIL/uL (ref 4.22–5.81)
RDW: 12.7 % (ref 11.5–15.5)
WBC: 6.2 10*3/uL (ref 4.0–10.5)
nRBC: 0 % (ref 0.0–0.2)

## 2019-09-11 LAB — BASIC METABOLIC PANEL
Anion gap: 8 (ref 5–15)
BUN: 13 mg/dL (ref 6–20)
CO2: 25 mmol/L (ref 22–32)
Calcium: 9.7 mg/dL (ref 8.9–10.3)
Chloride: 104 mmol/L (ref 98–111)
Creatinine, Ser: 0.53 mg/dL — ABNORMAL LOW (ref 0.61–1.24)
GFR calc Af Amer: >60 mL/min (ref >60)
GFR calc non Af Amer: >60 mL/min (ref >60)
Glucose, Bld: 93 mg/dL (ref 70–99)
Potassium: 4.1 mmol/L (ref 3.5–5.1)
Sodium: 137 mmol/L (ref 135–145)

## 2019-09-11 NOTE — Progress Notes (Signed)
PCP - Katina Degree. [name], MD Cardiologist - Denies  PPM/ICD - Denies  Chest x-ray - 08/02/19 EKG - 08/05/19 Stress Test - Denies ECHO - Denies Cardiac Cath - Denies  Sleep Study - Denies  Patient denies being a diabetic.  Blood Thinner Instructions: N/A Aspirin Instructions: N/A  ERAS Protcol - N/A PRE-SURGERY Ensure or G2- N/A  COVID TEST- 09/14/19   Anesthesia review: No  Patient denies shortness of breath, fever, cough and chest pain at PAT appointment   All instructions explained to the patient, with a verbal understanding of the material. Patient agrees to go over the instructions while at home for a better understanding. Patient also instructed to self quarantine after being tested for COVID-19. The opportunity to ask questions was provided.

## 2019-09-12 ENCOUNTER — Telehealth: Payer: Self-pay | Admitting: *Deleted

## 2019-09-12 NOTE — Telephone Encounter (Signed)
Kayla OT called to request OT extension 1 wk4 to continue working on ADL and transfers.  Approval given.

## 2019-09-14 ENCOUNTER — Other Ambulatory Visit (HOSPITAL_COMMUNITY)
Admission: RE | Admit: 2019-09-14 | Discharge: 2019-09-14 | Disposition: A | Discharge status: Home / Self Care | Payer: HRSA Program | Source: Ambulatory Visit | Attending: Student | Admitting: Student

## 2019-09-14 ENCOUNTER — Other Ambulatory Visit: Payer: Self-pay | Admitting: Family Medicine

## 2019-09-14 DIAGNOSIS — Z01812 Encounter for preprocedural laboratory examination: Secondary | ICD-10-CM | POA: Insufficient documentation

## 2019-09-14 DIAGNOSIS — Z20822 Contact with and (suspected) exposure to covid-19: Secondary | ICD-10-CM | POA: Insufficient documentation

## 2019-09-14 LAB — SARS CORONAVIRUS 2 (TAT 6-24 HRS): SARS Coronavirus 2: NEGATIVE

## 2019-09-16 ENCOUNTER — Encounter: Payer: No Typology Code available for payment source | Admitting: Physical Medicine and Rehabilitation

## 2019-09-17 NOTE — Anesthesia Preprocedure Evaluation (Addendum)
Anesthesia Evaluation  Patient identified by MRN, date of birth, ID band Patient awake    Reviewed: Allergy & Precautions, NPO status , Patient's Chart, lab work & pertinent test results  Airway Mallampati: II  TM Distance: >3 FB Neck ROM: Full    Dental  (+) Missing, Poor Dentition,    Pulmonary former smoker,  trach removed 11-16-2018, on vent from jan until may 2020   Pulmonary exam normal breath sounds clear to auscultation       Cardiovascular negative cardio ROS   Rhythm:Regular Rate:Tachycardia  ECG: SR, rate 73   Neuro/Psych PSYCHIATRIC DISORDERS Anxiety Depression negative neurological ROS     GI/Hepatic Neg liver ROS, GERD  Medicated and Controlled,  Endo/Other  negative endocrine ROS  Renal/GU negative Renal ROS     Musculoskeletal Chronic pain syndrome   Abdominal   Peds  Hematology negative hematology ROS (+)   Anesthesia Other Findings Infected Nonunion of Pelvis, Left side  Reproductive/Obstetrics                            Anesthesia Physical Anesthesia Plan  ASA: III  Anesthesia Plan: General   Post-op Pain Management:    Induction: Intravenous  PONV Risk Score and Plan: 2 and Ondansetron, Dexamethasone and Treatment may vary due to age or medical condition  Airway Management Planned: Oral ETT  Additional Equipment:   Intra-op Plan:   Post-operative Plan: Extubation in OR  Informed Consent: I have reviewed the patients History and Physical, chart, labs and discussed the procedure including the risks, benefits and alternatives for the proposed anesthesia with the patient or authorized representative who has indicated his/her understanding and acceptance.     Dental advisory given  Plan Discussed with: CRNA  Anesthesia Plan Comments:        Anesthesia Quick Evaluation
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
## 2019-09-18 ENCOUNTER — Inpatient Hospital Stay (HOSPITAL_COMMUNITY)
Admission: RE | Admit: 2019-09-18 | Discharge: 2019-09-23 | DRG: 516 | Disposition: A | Discharge status: Home Health | Payer: No Typology Code available for payment source | Attending: Student | Admitting: Student

## 2019-09-18 ENCOUNTER — Inpatient Hospital Stay (HOSPITAL_COMMUNITY): Payer: No Typology Code available for payment source | Admitting: Anesthesiology

## 2019-09-18 ENCOUNTER — Encounter (HOSPITAL_COMMUNITY): Payer: Self-pay | Admitting: Student

## 2019-09-18 ENCOUNTER — Other Ambulatory Visit: Payer: Self-pay

## 2019-09-18 ENCOUNTER — Inpatient Hospital Stay (HOSPITAL_COMMUNITY): Payer: No Typology Code available for payment source

## 2019-09-18 ENCOUNTER — Encounter (HOSPITAL_COMMUNITY)
Admission: RE | Disposition: A | Discharge status: Home Health | Payer: Self-pay | Source: Home / Self Care | Attending: Student

## 2019-09-18 DIAGNOSIS — Z881 Allergy status to other antibiotic agents status: Secondary | ICD-10-CM

## 2019-09-18 DIAGNOSIS — G894 Chronic pain syndrome: Secondary | ICD-10-CM | POA: Diagnosis present

## 2019-09-18 DIAGNOSIS — M86452 Chronic osteomyelitis with draining sinus, left femur: Secondary | ICD-10-CM | POA: Diagnosis present

## 2019-09-18 DIAGNOSIS — Z87442 Personal history of urinary calculi: Secondary | ICD-10-CM | POA: Diagnosis not present

## 2019-09-18 DIAGNOSIS — S32392K Other fracture of left ilium, subsequent encounter for fracture with nonunion: Secondary | ICD-10-CM

## 2019-09-18 DIAGNOSIS — F17211 Nicotine dependence, cigarettes, in remission: Secondary | ICD-10-CM

## 2019-09-18 DIAGNOSIS — B9689 Other specified bacterial agents as the cause of diseases classified elsewhere: Secondary | ICD-10-CM | POA: Diagnosis not present

## 2019-09-18 DIAGNOSIS — D62 Acute posthemorrhagic anemia: Secondary | ICD-10-CM | POA: Diagnosis not present

## 2019-09-18 DIAGNOSIS — M869 Osteomyelitis, unspecified: Secondary | ICD-10-CM | POA: Diagnosis present

## 2019-09-18 DIAGNOSIS — Z803 Family history of malignant neoplasm of breast: Secondary | ICD-10-CM

## 2019-09-18 DIAGNOSIS — S31000D Unspecified open wound of lower back and pelvis without penetration into retroperitoneum, subsequent encounter: Secondary | ICD-10-CM

## 2019-09-18 DIAGNOSIS — Z978 Presence of other specified devices: Secondary | ICD-10-CM

## 2019-09-18 DIAGNOSIS — F419 Anxiety disorder, unspecified: Secondary | ICD-10-CM | POA: Diagnosis present

## 2019-09-18 DIAGNOSIS — S329XXK Fracture of unspecified parts of lumbosacral spine and pelvis, subsequent encounter for fracture with nonunion: Secondary | ICD-10-CM | POA: Diagnosis not present

## 2019-09-18 DIAGNOSIS — M8668 Other chronic osteomyelitis, other site: Secondary | ICD-10-CM

## 2019-09-18 DIAGNOSIS — S31829D Unspecified open wound of left buttock, subsequent encounter: Secondary | ICD-10-CM

## 2019-09-18 DIAGNOSIS — W230XXD Caught, crushed, jammed, or pinched between moving objects, subsequent encounter: Secondary | ICD-10-CM | POA: Diagnosis not present

## 2019-09-18 DIAGNOSIS — B9620 Unspecified Escherichia coli [E. coli] as the cause of diseases classified elsewhere: Secondary | ICD-10-CM | POA: Diagnosis present

## 2019-09-18 DIAGNOSIS — Z95828 Presence of other vascular implants and grafts: Secondary | ICD-10-CM | POA: Diagnosis not present

## 2019-09-18 DIAGNOSIS — Z419 Encounter for procedure for purposes other than remedying health state, unspecified: Secondary | ICD-10-CM

## 2019-09-18 DIAGNOSIS — Z87891 Personal history of nicotine dependence: Secondary | ICD-10-CM | POA: Diagnosis not present

## 2019-09-18 HISTORY — PX: APPLICATION OF A-CELL OF EXTREMITY: SHX6303

## 2019-09-18 HISTORY — PX: INCISION AND DRAINAGE HIP: SHX1801

## 2019-09-18 HISTORY — PX: MASS EXCISION: SHX2000

## 2019-09-18 LAB — C-REACTIVE PROTEIN: CRP: 7.9 mg/dL — ABNORMAL HIGH (ref <1.0)

## 2019-09-18 LAB — HEPATIC FUNCTION PANEL
ALT: 18 U/L (ref 0–44)
AST: 17 U/L (ref 15–41)
Albumin: 3.1 g/dL — ABNORMAL LOW (ref 3.5–5.0)
Alkaline Phosphatase: 145 U/L — ABNORMAL HIGH (ref 38–126)
Bilirubin, Direct: <0.1 mg/dL (ref 0.0–0.2)
Total Bilirubin: 0.3 mg/dL (ref 0.3–1.2)
Total Protein: 7 g/dL (ref 6.5–8.1)

## 2019-09-18 LAB — CBC
HCT: 38 % — ABNORMAL LOW (ref 39.0–52.0)
Hemoglobin: 12.2 g/dL — ABNORMAL LOW (ref 13.0–17.0)
MCH: 32.8 pg (ref 26.0–34.0)
MCHC: 32.1 g/dL (ref 30.0–36.0)
MCV: 102.2 fL — ABNORMAL HIGH (ref 80.0–100.0)
Platelets: 248 10*3/uL (ref 150–400)
RBC: 3.72 MIL/uL — ABNORMAL LOW (ref 4.22–5.81)
RDW: 12.7 % (ref 11.5–15.5)
WBC: 6.4 10*3/uL (ref 4.0–10.5)
nRBC: 0 % (ref 0.0–0.2)

## 2019-09-18 LAB — CREATININE, SERUM
Creatinine, Ser: 0.64 mg/dL (ref 0.61–1.24)
GFR calc Af Amer: >60 mL/min (ref >60)
GFR calc non Af Amer: >60 mL/min (ref >60)

## 2019-09-18 LAB — SEDIMENTATION RATE: Sed Rate: 42 mm/hr — ABNORMAL HIGH (ref 0–16)

## 2019-09-18 SURGERY — IRRIGATION AND DEBRIDEMENT HIP
Anesthesia: General | Site: Hip | Laterality: Left

## 2019-09-18 MED ORDER — PROMETHAZINE HCL 25 MG/ML IJ SOLN: 6.2500 mg | INTRAMUSCULAR | Status: DC | PRN

## 2019-09-18 MED ORDER — POLYETHYLENE GLYCOL 3350 17 G PO PACK
17.0000 g | PACK | Freq: Every day | ORAL | Status: DC
  Administered 2019-09-19 – 2019-09-20 (×2): 17 g via ORAL
  Filled 2019-09-18 (×4): qty 1

## 2019-09-18 MED ORDER — OXYCODONE HCL 5 MG PO TABS
5.0000 mg | ORAL_TABLET | Freq: Once | ORAL | Status: AC | PRN
  Administered 2019-09-18: 5 mg via ORAL

## 2019-09-18 MED ORDER — LIDOCAINE 2% (20 MG/ML) 5 ML SYRINGE
INTRAMUSCULAR | Status: DC | PRN
  Administered 2019-09-18: 60 mg via INTRAVENOUS

## 2019-09-18 MED ORDER — OXYCODONE HCL 5 MG PO TABS
ORAL_TABLET | ORAL | Status: AC
  Filled 2019-09-18: qty 1

## 2019-09-18 MED ORDER — ALBUTEROL SULFATE (2.5 MG/3ML) 0.083% IN NEBU: 3.0000 mL | INHALATION_SOLUTION | Freq: Four times a day (QID) | RESPIRATORY_TRACT | Status: DC | PRN

## 2019-09-18 MED ORDER — GABAPENTIN 300 MG PO CAPS
300.0000 mg | ORAL_CAPSULE | Freq: Three times a day (TID) | ORAL | Status: DC
  Administered 2019-09-18 – 2019-09-23 (×16): 300 mg via ORAL
  Filled 2019-09-18 (×16): qty 1

## 2019-09-18 MED ORDER — METOCLOPRAMIDE HCL 5 MG PO TABS
5.0000 mg | ORAL_TABLET | Freq: Three times a day (TID) | ORAL | Status: DC | PRN
  Administered 2019-09-22: 10 mg via ORAL

## 2019-09-18 MED ORDER — OXYCODONE-ACETAMINOPHEN 5-325 MG PO TABS
1.0000 | ORAL_TABLET | ORAL | Status: DC | PRN
  Administered 2019-09-18 – 2019-09-23 (×17): 2 via ORAL
  Filled 2019-09-18 (×20): qty 2

## 2019-09-18 MED ORDER — HYDROMORPHONE HCL 1 MG/ML IJ SOLN
INTRAMUSCULAR | Status: AC
  Filled 2019-09-18: qty 1

## 2019-09-18 MED ORDER — QUETIAPINE FUMARATE 25 MG PO TABS
50.0000 mg | ORAL_TABLET | Freq: Every day | ORAL | Status: DC
  Administered 2019-09-18 – 2019-09-22 (×5): 50 mg via ORAL
  Filled 2019-09-18 (×5): qty 2

## 2019-09-18 MED ORDER — METOCLOPRAMIDE HCL 5 MG/ML IJ SOLN
5.0000 mg | Freq: Three times a day (TID) | INTRAMUSCULAR | Status: DC | PRN
  Administered 2019-09-21: 10 mg via INTRAVENOUS
  Administered 2019-09-22: 5 mg via INTRAVENOUS
  Filled 2019-09-18 (×3): qty 2

## 2019-09-18 MED ORDER — LIDOCAINE-EPINEPHRINE 1 %-1:100000 IJ SOLN
INTRAMUSCULAR | Status: AC
  Filled 2019-09-18: qty 1

## 2019-09-18 MED ORDER — METHOCARBAMOL 1000 MG/10ML IJ SOLN
500.0000 mg | Freq: Four times a day (QID) | INTRAVENOUS | Status: DC | PRN
  Filled 2019-09-18: qty 5

## 2019-09-18 MED ORDER — FENTANYL CITRATE (PF) 250 MCG/5ML IJ SOLN
INTRAMUSCULAR | Status: AC
  Filled 2019-09-18: qty 5

## 2019-09-18 MED ORDER — LIDOCAINE HCL (CARDIAC) PF 100 MG/5ML IV SOSY: PREFILLED_SYRINGE | INTRAVENOUS | Status: DC | PRN

## 2019-09-18 MED ORDER — ROCURONIUM BROMIDE 100 MG/10ML IV SOLN: INTRAVENOUS | Status: DC | PRN

## 2019-09-18 MED ORDER — SUGAMMADEX SODIUM 200 MG/2ML IV SOLN
INTRAVENOUS | Status: DC | PRN
  Administered 2019-09-18: 140 mg via INTRAVENOUS

## 2019-09-18 MED ORDER — ONDANSETRON HCL 4 MG/2ML IJ SOLN
4.0000 mg | Freq: Four times a day (QID) | INTRAMUSCULAR | Status: DC | PRN
  Administered 2019-09-20 – 2019-09-22 (×5): 4 mg via INTRAVENOUS
  Filled 2019-09-18 (×5): qty 2

## 2019-09-18 MED ORDER — LIDOCAINE 5 % EX PTCH
3.0000 | MEDICATED_PATCH | Freq: Two times a day (BID) | CUTANEOUS | Status: DC
  Administered 2019-09-18 – 2019-09-21 (×5): 3 via TRANSDERMAL
  Filled 2019-09-18 (×8): qty 3

## 2019-09-18 MED ORDER — CEFAZOLIN SODIUM-DEXTROSE 2-4 GM/100ML-% IV SOLN
2.0000 g | INTRAVENOUS | Status: AC
  Administered 2019-09-18: 2 g via INTRAVENOUS
  Filled 2019-09-18: qty 100

## 2019-09-18 MED ORDER — METHYLENE BLUE 0.5 % INJ SOLN
INTRAVENOUS | Status: DC | PRN
  Administered 2019-09-18: 1 mL

## 2019-09-18 MED ORDER — FENTANYL CITRATE (PF) 250 MCG/5ML IJ SOLN
INTRAMUSCULAR | Status: DC | PRN
  Administered 2019-09-18 (×5): 50 ug via INTRAVENOUS

## 2019-09-18 MED ORDER — VANCOMYCIN HCL 1000 MG IV SOLR
INTRAVENOUS | Status: DC | PRN
  Administered 2019-09-18: 1000 mg

## 2019-09-18 MED ORDER — MIDAZOLAM HCL 2 MG/2ML IJ SOLN
INTRAMUSCULAR | Status: AC
  Filled 2019-09-18: qty 2

## 2019-09-18 MED ORDER — METHYLENE BLUE 0.5 % INJ SOLN
INTRAVENOUS | Status: AC
  Filled 2019-09-18: qty 10

## 2019-09-18 MED ORDER — ENOXAPARIN SODIUM 40 MG/0.4ML ~~LOC~~ SOLN
40.0000 mg | SUBCUTANEOUS | Status: DC
  Administered 2019-09-19 – 2019-09-23 (×5): 40 mg via SUBCUTANEOUS
  Filled 2019-09-18 (×5): qty 0.4

## 2019-09-18 MED ORDER — ACETAMINOPHEN 10 MG/ML IV SOLN
INTRAVENOUS | Status: AC
  Filled 2019-09-18: qty 100

## 2019-09-18 MED ORDER — POTASSIUM CHLORIDE IN NACL 20-0.9 MEQ/L-% IV SOLN
INTRAVENOUS | Status: DC
  Filled 2019-09-18 (×4): qty 1000

## 2019-09-18 MED ORDER — 0.9 % SODIUM CHLORIDE (POUR BTL) OPTIME
TOPICAL | Status: DC | PRN
  Administered 2019-09-18: 1000 mL

## 2019-09-18 MED ORDER — PHENYLEPHRINE 40 MCG/ML (10ML) SYRINGE FOR IV PUSH (FOR BLOOD PRESSURE SUPPORT)
PREFILLED_SYRINGE | INTRAVENOUS | Status: DC | PRN
  Administered 2019-09-18: 80 ug via INTRAVENOUS
  Administered 2019-09-18: 40 ug via INTRAVENOUS
  Administered 2019-09-18: 80 ug via INTRAVENOUS

## 2019-09-18 MED ORDER — OXYCODONE HCL 5 MG/5ML PO SOLN: 5.0000 mg | Freq: Once | ORAL | Status: AC | PRN

## 2019-09-18 MED ORDER — LACTATED RINGERS IV SOLN: INTRAVENOUS | Status: DC | PRN

## 2019-09-18 MED ORDER — HYDROMORPHONE HCL 1 MG/ML IJ SOLN
0.2500 mg | INTRAMUSCULAR | Status: DC | PRN
  Administered 2019-09-18 (×4): 0.5 mg via INTRAVENOUS

## 2019-09-18 MED ORDER — HYDROMORPHONE HCL 1 MG/ML IJ SOLN
1.0000 mg | INTRAMUSCULAR | Status: DC | PRN
  Administered 2019-09-18 – 2019-09-23 (×27): 1 mg via INTRAVENOUS
  Filled 2019-09-18 (×29): qty 1

## 2019-09-18 MED ORDER — MORPHINE SULFATE (PF) 2 MG/ML IV SOLN
2.0000 mg | INTRAVENOUS | Status: DC | PRN
  Administered 2019-09-18 – 2019-09-22 (×10): 2 mg via INTRAVENOUS
  Filled 2019-09-18 (×10): qty 1

## 2019-09-18 MED ORDER — SENNOSIDES-DOCUSATE SODIUM 8.6-50 MG PO TABS
2.0000 | ORAL_TABLET | Freq: Every day | ORAL | Status: DC
  Administered 2019-09-18 – 2019-09-22 (×5): 2 via ORAL
  Filled 2019-09-18 (×5): qty 2

## 2019-09-18 MED ORDER — TOBRAMYCIN SULFATE 1.2 G IJ SOLR
INTRAMUSCULAR | Status: AC
  Filled 2019-09-18: qty 1.2

## 2019-09-18 MED ORDER — DEXAMETHASONE SODIUM PHOSPHATE 10 MG/ML IJ SOLN
INTRAMUSCULAR | Status: DC | PRN
  Administered 2019-09-18: 4 mg via INTRAVENOUS

## 2019-09-18 MED ORDER — SODIUM CHLORIDE 0.9 % IR SOLN
Status: DC | PRN
  Administered 2019-09-18 (×2): 3000 mL

## 2019-09-18 MED ORDER — ONDANSETRON HCL 4 MG PO TABS: 4.0000 mg | ORAL_TABLET | Freq: Four times a day (QID) | ORAL | Status: DC | PRN

## 2019-09-18 MED ORDER — VANCOMYCIN HCL 1000 MG IV SOLR
INTRAVENOUS | Status: AC
  Filled 2019-09-18: qty 1000

## 2019-09-18 MED ORDER — CITALOPRAM HYDROBROMIDE 20 MG PO TABS
20.0000 mg | ORAL_TABLET | Freq: Every day | ORAL | Status: DC
  Administered 2019-09-19 – 2019-09-23 (×5): 20 mg via ORAL
  Filled 2019-09-18 (×5): qty 1

## 2019-09-18 MED ORDER — CHLORHEXIDINE GLUCONATE 4 % EX LIQD: 60.0000 mL | Freq: Once | CUTANEOUS | Status: DC

## 2019-09-18 MED ORDER — CLONAZEPAM 0.25 MG PO TBDP
0.2500 mg | ORAL_TABLET | Freq: Three times a day (TID) | ORAL | Status: DC | PRN
  Administered 2019-09-18 – 2019-09-22 (×5): 0.25 mg via ORAL
  Filled 2019-09-18 (×5): qty 1

## 2019-09-18 MED ORDER — METHOCARBAMOL 500 MG PO TABS
500.0000 mg | ORAL_TABLET | Freq: Four times a day (QID) | ORAL | Status: DC | PRN
  Administered 2019-09-18 – 2019-09-23 (×10): 500 mg via ORAL
  Filled 2019-09-18 (×9): qty 1

## 2019-09-18 MED ORDER — PRO-STAT SUGAR FREE PO LIQD
30.0000 mL | Freq: Three times a day (TID) | ORAL | Status: DC
  Administered 2019-09-18 – 2019-09-21 (×10): 30 mL via ORAL
  Filled 2019-09-18 (×11): qty 30

## 2019-09-18 MED ORDER — ROCURONIUM BROMIDE 10 MG/ML (PF) SYRINGE
PREFILLED_SYRINGE | INTRAVENOUS | Status: DC | PRN
  Administered 2019-09-18: 10 mg via INTRAVENOUS
  Administered 2019-09-18: 20 mg via INTRAVENOUS
  Administered 2019-09-18: 40 mg via INTRAVENOUS

## 2019-09-18 MED ORDER — SODIUM CHLORIDE 0.9 % IV SOLN
2.0000 g | Freq: Three times a day (TID) | INTRAVENOUS | Status: DC
  Administered 2019-09-18 – 2019-09-21 (×10): 2 g via INTRAVENOUS
  Filled 2019-09-18 (×10): qty 2

## 2019-09-18 MED ORDER — TOBRAMYCIN SULFATE 1.2 G IJ SOLR
INTRAMUSCULAR | Status: DC | PRN
  Administered 2019-09-18: 1.2 g

## 2019-09-18 MED ORDER — MIDAZOLAM HCL 5 MG/5ML IJ SOLN
INTRAMUSCULAR | Status: DC | PRN
  Administered 2019-09-18: 2 mg via INTRAVENOUS

## 2019-09-18 MED ORDER — MELATONIN 3 MG PO TABS
3.0000 mg | ORAL_TABLET | Freq: Every day | ORAL | Status: DC
  Administered 2019-09-18 – 2019-09-22 (×5): 3 mg via ORAL
  Filled 2019-09-18 (×5): qty 1

## 2019-09-18 MED ORDER — METRONIDAZOLE 500 MG PO TABS
500.0000 mg | ORAL_TABLET | Freq: Three times a day (TID) | ORAL | Status: DC
  Administered 2019-09-18 – 2019-09-21 (×10): 500 mg via ORAL
  Filled 2019-09-18 (×10): qty 1

## 2019-09-18 MED ORDER — ONDANSETRON HCL 4 MG/2ML IJ SOLN
INTRAMUSCULAR | Status: DC | PRN
  Administered 2019-09-18: 4 mg via INTRAVENOUS

## 2019-09-18 MED ORDER — ACETAMINOPHEN 10 MG/ML IV SOLN
1000.0000 mg | Freq: Once | INTRAVENOUS | Status: DC | PRN
  Administered 2019-09-18: 1000 mg via INTRAVENOUS

## 2019-09-18 MED ORDER — PROPOFOL 10 MG/ML IV BOLUS
INTRAVENOUS | Status: AC
  Filled 2019-09-18: qty 20

## 2019-09-18 MED ORDER — FENTANYL 50 MCG/HR TD PT72
1.0000 | MEDICATED_PATCH | TRANSDERMAL | Status: DC
  Administered 2019-09-18 – 2019-09-22 (×3): 1 via TRANSDERMAL
  Filled 2019-09-18 (×3): qty 1

## 2019-09-18 MED ORDER — METHOCARBAMOL 500 MG PO TABS
ORAL_TABLET | ORAL | Status: AC
  Filled 2019-09-18: qty 1

## 2019-09-18 MED ORDER — PHENYLEPHRINE HCL-NACL 10-0.9 MG/250ML-% IV SOLN
INTRAVENOUS | Status: DC | PRN
  Administered 2019-09-18: 25 ug/min via INTRAVENOUS

## 2019-09-18 MED ORDER — PROPOFOL 10 MG/ML IV BOLUS
INTRAVENOUS | Status: DC | PRN
  Administered 2019-09-18: 100 mg via INTRAVENOUS

## 2019-09-18 SURGICAL SUPPLY — 65 items
ADH SKN CLS APL DERMABOND .7 (GAUZE/BANDAGES/DRESSINGS)
BLADE CLIPPER SURG (BLADE) IMPLANT
BLADE SURG 15 STRL LF DISP TIS (BLADE) ×2 IMPLANT
BLADE SURG 15 STRL SS (BLADE) ×3
BOWL CEMENT MIX W/ADAPTER (MISCELLANEOUS) ×1 IMPLANT
CANISTER SUCT 3000ML PPV (MISCELLANEOUS) IMPLANT
CANISTER WOUND CARE 500ML ATS (WOUND CARE) ×3 IMPLANT
CANISTER WOUNDNEG PRESSURE 500 (CANNISTER) ×1 IMPLANT
CEMENT BONE SIMPLEX SPEEDSET (Cement) ×1 IMPLANT
CONT SPEC 4OZ CLIKSEAL STRL BL (MISCELLANEOUS) ×4 IMPLANT
COVER SURGICAL LIGHT HANDLE (MISCELLANEOUS) ×3 IMPLANT
COVER WAND RF STERILE (DRAPES) ×3 IMPLANT
DECANTER SPIKE VIAL GLASS SM (MISCELLANEOUS) ×3 IMPLANT
DERMABOND ADVANCED (GAUZE/BANDAGES/DRESSINGS)
DERMABOND ADVANCED .7 DNX12 (GAUZE/BANDAGES/DRESSINGS) IMPLANT
DRAPE C-ARM 42X72 X-RAY (DRAPES) ×1 IMPLANT
DRAPE C-ARMOR (DRAPES) ×1 IMPLANT
DRAPE INCISE IOBAN 66X45 STRL (DRAPES) IMPLANT
DRAPE LAPAROTOMY 100X72 PEDS (DRAPES) IMPLANT
DRAPE U-SHAPE 76X120 STRL (DRAPES) IMPLANT
DRSG ADAPTIC 3X8 NADH LF (GAUZE/BANDAGES/DRESSINGS) IMPLANT
DRSG CUTIMED SORBACT 7X9 (GAUZE/BANDAGES/DRESSINGS) ×3 IMPLANT
DRSG HYDROCOLLOID 4X4 (GAUZE/BANDAGES/DRESSINGS) ×1 IMPLANT
DRSG TEGADERM 2-3/8X2-3/4 SM (GAUZE/BANDAGES/DRESSINGS) IMPLANT
DRSG VAC ATS LRG SENSATRAC (GAUZE/BANDAGES/DRESSINGS) IMPLANT
DRSG VAC ATS MED SENSATRAC (GAUZE/BANDAGES/DRESSINGS) IMPLANT
DRSG VAC ATS SM SENSATRAC (GAUZE/BANDAGES/DRESSINGS) ×1 IMPLANT
ELECT CAUTERY BLADE 6.4 (BLADE) ×3 IMPLANT
ELECT COATED BLADE 2.86 ST (ELECTRODE) IMPLANT
ELECT NDL BLADE 2-5/6 (NEEDLE) IMPLANT
ELECT NEEDLE BLADE 2-5/6 (NEEDLE) IMPLANT
ELECT REM PT RETURN 9FT ADLT (ELECTROSURGICAL) ×3
ELECTRODE REM PT RTRN 9FT ADLT (ELECTROSURGICAL) ×2 IMPLANT
GAUZE SPONGE 4X4 12PLY STRL (GAUZE/BANDAGES/DRESSINGS) ×3 IMPLANT
GAUZE SPONGE 4X4 12PLY STRL LF (GAUZE/BANDAGES/DRESSINGS) ×3 IMPLANT
GEL ULTRASOUND 20GR AQUASONIC (MISCELLANEOUS) IMPLANT
GLOVE BIO SURGEON STRL SZ 6.5 (GLOVE) ×6 IMPLANT
GLOVE BIO SURGEON STRL SZ7.5 (GLOVE) ×3 IMPLANT
GLOVE ECLIPSE 8.0 STRL XLNG CF (GLOVE) ×2 IMPLANT
GOWN STRL REUS W/ TWL LRG LVL3 (GOWN DISPOSABLE) ×6 IMPLANT
GOWN STRL REUS W/TWL LRG LVL3 (GOWN DISPOSABLE) ×9
KIT BASIN OR (CUSTOM PROCEDURE TRAY) ×3 IMPLANT
MATRIX WOUND 3-LAYER 5X5 (Tissue) ×1 IMPLANT
MICROMATRIX 1000MG (Tissue) ×3 IMPLANT
NDL HYPO 25GX1X1/2 BEV (NEEDLE) ×2 IMPLANT
NEEDLE HYPO 25GX1X1/2 BEV (NEEDLE) ×3 IMPLANT
NS IRRIG 1000ML POUR BTL (IV SOLUTION) ×3 IMPLANT
PACK GENERAL/GYN (CUSTOM PROCEDURE TRAY) ×3 IMPLANT
PACK SURGICAL SETUP 50X90 (CUSTOM PROCEDURE TRAY) ×3 IMPLANT
PENCIL BUTTON HOLSTER BLD 10FT (ELECTRODE) ×3 IMPLANT
SOLUTION PARTIC MCRMTRX 1000MG (Tissue) IMPLANT
SPONGE LAP 18X18 X RAY DECT (DISPOSABLE) ×2 IMPLANT
STAPLER VISISTAT 35W (STAPLE) ×1 IMPLANT
STOCKINETTE IMPERVIOUS 9X36 MD (GAUZE/BANDAGES/DRESSINGS) IMPLANT
STOCKINETTE IMPERVIOUS LG (DRAPES) IMPLANT
STRIP CLOSURE SKIN 1/2X4 (GAUZE/BANDAGES/DRESSINGS) IMPLANT
SURGILUBE 2OZ TUBE FLIPTOP (MISCELLANEOUS) ×3 IMPLANT
SUT SILK 4 0 P 3 (SUTURE) IMPLANT
SUT VIC AB 5-0 PS2 18 (SUTURE) ×5 IMPLANT
SYR BULB 3OZ (MISCELLANEOUS) ×3 IMPLANT
SYR CONTROL 10ML LL (SYRINGE) ×3 IMPLANT
TOWEL GREEN STERILE (TOWEL DISPOSABLE) ×3 IMPLANT
TOWEL GREEN STERILE FF (TOWEL DISPOSABLE) ×3 IMPLANT
TUBE CONNECTING 12X1/4 (SUCTIONS) ×3 IMPLANT
YANKAUER SUCT BULB TIP NO VENT (SUCTIONS) ×3 IMPLANT

## 2019-09-18 NOTE — Transfer of Care (Signed)
Immediate Anesthesia Transfer of Care Note  Patient: NAPOLEAN SIA  Procedure(s) Performed: IRRIGATION AND DEBRIDEMENT HIP/ PELVIS WITH WOUND VAC PLACEMENT (Left Hip) APPLICATION OF A-CELL OF EXTREMITY (Left ) EXCISION UPPER LEFT INNER THIGH WOUND (Left )  Patient Location: PACU  Anesthesia Type:General  Level of Consciousness: awake, alert  and oriented  Airway & Oxygen Therapy: Patient Spontanous Breathing and Patient connected to nasal cannula oxygen  Post-op Assessment: Report given to RN and Post -op Vital signs reviewed and stable  Post vital signs: Reviewed and stable  Last Vitals:  Vitals Value Taken Time  BP    Temp    Pulse    Resp    SpO2      Last Pain:  Vitals:   09/18/19 0659  PainSc: 8       Patients Stated Pain Goal: 2 (09/18/19 0659)  Complications: No apparent anesthesia complications

## 2019-09-18 NOTE — Anesthesia Postprocedure Evaluation (Signed)
Anesthesia Post Note  Patient: Christopher [name]  Procedure(s) Performed: IRRIGATION AND DEBRIDEMENT HIP/ PELVIS WITH WOUND VAC PLACEMENT (Left Hip) APPLICATION OF A-CELL OF EXTREMITY (Left ) EXCISION UPPER LEFT INNER THIGH WOUND (Left )     Patient location during evaluation: PACU Anesthesia Type: General Level of consciousness: awake and alert Pain management: pain level controlled Vital Signs Assessment: post-procedure vital signs reviewed and stable Respiratory status: spontaneous breathing, nonlabored ventilation, respiratory function stable and patient connected to nasal cannula oxygen Cardiovascular status: blood pressure returned to baseline and stable Postop Assessment: no apparent nausea or vomiting Anesthetic complications: no    Last Vitals:  Vitals:   09/18/19 1400 09/18/19 1444  BP: 131/81 (!) 137/97  Pulse: 79 86  Resp: 12 18  Temp: 36.7 C 36.6 C  SpO2: 96% 97%    Last Pain:  Vitals:   09/18/19 1444  TempSrc: Oral  PainSc:                  Kutler Vanvranken P Zanita Millman

## 2019-09-18 NOTE — Anesthesia Procedure Notes (Signed)
Procedure Name: Intubation Date/Time: 09/18/2019 8:38 AM Performed by: Wilburn Cornelia, CRNA Pre-anesthesia Checklist: Patient identified, Emergency Drugs available, Suction available, Patient being monitored and Timeout performed Patient Re-evaluated:Patient Re-evaluated prior to induction Oxygen Delivery Method: Circle system utilized Preoxygenation: Pre-oxygenation with 100% oxygen Induction Type: IV induction Ventilation: Mask ventilation without difficulty Laryngoscope Size: Mac and 4 Grade View: Grade IV Tube type: Oral Tube size: 7.0 mm Number of attempts: 1 Airway Equipment and Method: Stylet Placement Confirmation: ETT inserted through vocal cords under direct vision,  positive ETCO2,  CO2 detector and breath sounds checked- equal and bilateral Secured at: 23 cm Tube secured with: Tape Dental Injury: Teeth and Oropharynx as per pre-operative assessment
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
## 2019-09-18 NOTE — Plan of Care (Signed)

## 2019-09-18 NOTE — Discharge Instructions (Signed)
Wound Care with Acell ° °Guide to Wound Care  °Proper wound care may reduce the risk of infection, improve healing rates, and limit scarring.  This is a general guide to help care for and manage wounds treated with ACell MicroMatrix?or Cytal® Wound Matrix.  ° °Dressing Changes °The frequency of dressing changes can vary based on which product was applied, the size of the wound, or the amount of wound drainage. Dressing inspections are recommended, at least weekly.   °If you have a Wound VAC it will be changed in one week after the first time it is applied.  Then it will be changed once or twice a week.   °If you don't have a Wound VAC, then place KY gel on the wound daily and cover with gauze. ° °Dressing Types °Primary Dressing:  Non-adherent dressing goes directly over wounds being treated with the powder or sheet (MicroMatrix and/or Cytal).  °Secondary Dressing:  Secures the primary dressing in place and provides extra protection, compression, and absorption. ° °1. Wash Hands - To help decrease the risk of infection, caregivers should wash their hands for a minimum of 20 seconds and may use medical gloves.  ° °2. Remove the Dressings - Avoid removing product from the wound by carefully removing the applicable dressing(s) at the time points recommended above, or as recommended by the treating physician.  °Expected Color and Odor:  It is entirely normal for the wound to have an unpleasant odor and to form a caramel-colored gel as the product absorbs into the wound. It is  °important to leave this gel on the wound site. ° °3. Clean the Wound - Use clean water or saline to gently rinse around the wound surface and remove any excess discharge that may be present on the wound. Do not wipe off any of the caramel-colored gel on the wound.  ° °What to look out for: °• Large or increased amount of drainage  °• Surrounding skin has worsening redness or hot to touch  °• Increased pain in or around the wound  °• Flu-like  symptoms, fatigue, decreased appetite, fever  °• Hard, crusty wound surface with black or brown coloring ° °4. Apply New Dressings - Dressings should cover the entire wound and be suitable for maintaining a moist wound environment.  The non-adherent mesh dressing should be left in place.  New dressing should consist of [place] Jelly to keep the wound moist and soft gauze secured with a wrap or tape.  ° °Maintain a Hydrated Wound Area °It is important to keep the wound area moist throughout the healing process. If the wound appears to be dry during dressing changes, select a dressing that will hydrate the wound and maintain that ideal moist environment. If you are unsure what to do, ask the treating physician. ° °Remodeling Process °Every patient heals differently, and no two cases are the same. The size and location of the wound, product type and layering configurations, and general patient health all contribute to how quickly a wound will heal.  While many factors can influence the rate at which the product absorbs, the following can be used as a general guide.  ° °THINGS TO DO: °Refrain from smoking °High protein diet with plenty of vegetables and some fruit  °Limit simple processed carbohydrates and sugar °Protect the wound from trauma °Protect the dressing ° °Micromatrix powder       Cytal Sheet            Sorbact dressing ° ° °

## 2019-09-18 NOTE — Op Note (Signed)
DATE OF OPERATION: 09/18/2019  LOCATION: [facility]  PREOPERATIVE DIAGNOSIS: left iliac wound  POSTOPERATIVE DIAGNOSIS: Same  PROCEDURE: Preparation of the left iliac wound 3 x 3 x 4 cm for placement of Acell (sheet 5 x 5 cm and 1 gm powder), placement of VAC.  SURGEON: Naudia Crosley Sanger Chloeanne Poteet, DO  ASSISTANT: Keenan Bachelor, PA  EBL: 5 cc  CONDITION: Stable  COMPLICATIONS: None  INDICATION: The patient, Christopher [name], is a 54 y.o. male born on 1965/10/03, is here for treatment of a left iliac wound with possible osteomyelitis.   PROCEDURE DETAILS:  The patient was seen prior to surgery and marked.  The IV antibiotics were given. The patient was taken to the operating room and given a general anesthetic. A standard time out was performed and all information was confirmed by those in the room. SCDs were placed.   The patient was prepped and draped by the ortho team.  They performed their portion of the case which includes removal of the nonviable bone and sent bone for micro.  Their portion of the case is dictated separately.  Once complete the patient was rendered to the plastic surgery service.   The area was irrigated with antibiotic solution and saline to the 3 x 3 x 4 cm wound.  Hemostasis was achieved with electrocautery.  All of the Acell powder and sheet was applied.  The sheet was secured with the 5-0 Vicryl.  The sorbact was applied.  The white vac sponge was placed followed by the black vac sponge.  Duoderm was placed at the periwound area to protect the skin.  There was an excellent seal.   The patient was allowed to wake up and taken to recovery room in stable condition at the end of the case. The family was notified at the end of the case.   The advanced practice practitioner (APP) assisted throughout the case.  The APP was essential in retraction and counter traction when needed to make the case progress smoothly.  This retraction and assistance made it possible to  see the tissue plans for the procedure.  The assistance was needed for blood control, tissue re-approximation and assisted with closure of the incision site.  The 21st Century Cures Act was signed into law in 2016 which includes the topic of electronic health records.  This provides immediate access to information in MyChart.  This includes consultation notes, operative notes, office notes, lab results and pathology reports.  If you have any questions about what you read please let us know at your next visit or call us at the office.  We are right here with you.

## 2019-09-18 NOTE — TOC Initial Note (Signed)
Transition of Care [facility]) - Initial/Assessment Note    Patient Details  Name: Christopher [name] MRN: [id number] Date of Birth: [birth year]  Transition of Care [facility]) CM/SW Contact:    Curlene Labrum, RN Phone Number: 09/18/2019, 4:45 PM  Clinical Narrative:                 Case management met with the patient today at the bedside after patient returned from surgery for I and D hip/pelvis with wound vac placement and excision of left inner thigh wound.  Patient lives at home with his wife in a single home.  Patient has all needed DME equipment including an electric wheelchair, walker, and crutches.  Patient also uses a suprapublic catheter for urine output.  Patient currently has [facility] for IV infusions along with [facility] for RN/PT/OT and aides being supplied 8 hours a day for 7 days a week.  Plans are for continued IV therapy of Cefepime and po metronidazole.  Carolynn Sayers, RN with [facility] notified of patient's admission.  Will followup with further case management needs.  Expected Discharge Plan: [place]     Patient Goals and CMS Choice Patient states their goals for this hospitalization and ongoing recovery are:: I'm ready to get better from my infection in my pelvis. CMS Medicare.gov Compare Post Acute Care list provided to:: Patient Choice offered to / list presented to : Patient  Expected Discharge Plan and Services Expected Discharge Plan: [place]   Discharge Planning Services: CM Consult   Living arrangements for the past 2 months: Single Family Home                           HH Arranged: (Current services with [place]) [place]: [place] [facility]), [facility]        Prior Living Arrangements/Services Living arrangements for the past 2 months: Single Family Home Lives with:: Spouse Patient language and need for interpreter reviewed:: Yes Do you feel safe going back to the place where you live?:  Yes      Need for Family Participation in Patient Care: Yes (Comment) Care giver support system in place?: Yes (comment) Current home services: Home OT, Homehealth aide, DME, Home PT, Home RN(currently has [facility] IV infusions, [place] for RN/PT/OT,  aide 8 hours a day - 7 days a week) Criminal Activity/Legal Involvement Pertinent to Current Situation/Hospitalization: No - Comment as needed  Activities of Daily Living Home Assistive Devices/Equipment: Environmental consultant (specify type), Wheelchair    Permission Sought/Granted Permission sought to share information with : Case Manager Permission granted to share information with : Yes, Verbal Permission Granted     Permission granted to share info w AGENCY: [facility] and [facility]  Permission granted to share info w Relationship: wife     Emotional Assessment Appearance:: Appears stated age Attitude/Demeanor/Rapport: Gracious Affect (typically observed): Accepting Orientation: : Oriented to Self, Oriented to Place, Oriented to  Time, Oriented to Situation Alcohol / Substance Use: Not Applicable Psych Involvement: No (comment)  Admission diagnosis:  Bone infection of pelvis [facility]) [M86.9] Patient Active Problem List   Diagnosis Date Noted  . Bone infection of pelvis ([place]) 09/18/2019  . Myofascial pain dysfunction syndrome 08/05/2019  . Protein-calorie malnutrition, severe 06/01/2019  . Kidney stone 05/30/2019  . Acute lower UTI 05/30/2019  . Encounter for long-term use of opiate analgesic 05/03/2019  . PICC (peripherally inserted central catheter) in place 04/10/2019  .  Encounter for medication monitoring 04/10/2019  . Obstructive nephropathy 04/04/2019  . Intractable nausea and vomiting 04/04/2019  . Cough 03/26/2019  . Chronic osteomyelitis of pelvis, left ([place]) 03/07/2019  . Wound infection, posttraumatic 02/28/2019  . Hydronephrosis concurrent with and due to calculi of kidney and ureter 02/28/2019  . GERD (gastroesophageal  reflux disease) 02/28/2019  . Suprapubic catheter ([place]) 02/28/2019  . Protein-calorie malnutrition, moderate ([place]) 02/28/2019  . 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 Reactive depression 01/29/2019  . Chronic pain due to trauma 01/29/2019  . Neuropathic pain 01/29/2019  . Colostomy status ([place]) 01/14/2019  . Insomnia 01/14/2019  . Tachycardia 01/14/2019  . Current use of proton pump inhibitor 01/14/2019  . Wound of buttock 12/28/2018  . Anxiety and depression 12/17/2018  . Debility 11/23/2018  . Chronic pain syndrome   . Multiple traumatic injuries   . Pressure injury of skin 08/13/2018  . Urethral injury 07/13/2018   PCP:  Vivi Barrack, MD Pharmacy:   [id number] [place], [place] - [address]Phone: [phone]: [phone] [facility] [address] [address]Phone: [phone]: [phone]    Social Determinants of Health (SDOH) Interventions    Readmission Risk Interventions Readmission Risk Prevention Plan 09/18/2019 04/06/2019 10/16/2018  Transportation Screening Complete - Complete  PCP or Specialist Appt within 3-5 Days - Complete -  HRI or [place]- Complete -  Social Work Consult for [place]/Counseling - Complete -  Palliative Care Screening - Not Applicable -  Medication Review (Press photographer Complete Complete Complete  PCP or Specialist appointment within 3-5 days of discharge Complete - Not Complete  PCP/Specialist Appt Not Complete comments - - [facility] Consult Complete - Not Complete  HRI or Home Care Consult Pt Refusal Comments - - Pt discharging to LTAC  SW Recovery Care/Counseling Consult Complete - Not Complete  SW Consult Not Complete Comments - - [facility]  Care Screening Complete - Not Applicable  Skilled Nursing Facility Complete - Not Applicable  Some recent data might be hidden

## 2019-09-18 NOTE — Progress Notes (Signed)
Patient was medicated for anxiety.

## 2019-09-18 NOTE — Interval H&P Note (Signed)
History and Physical Interval Note:  09/18/2019 8:26 AM  Christopher [name]  has presented today for surgery, with the diagnosis of Infected Nonunion of Pelvis, Left side..  The various methods of treatment have been discussed with the patient and family. After consideration of risks, benefits and other options for treatment, the patient has consented to  Procedure(s): IRRIGATION AND DEBRIDEMENT HIP/ PELVIS WITH WOUND VAC PLACEMENT (Left) APPLICATION OF A-CELL OF EXTREMITY (Left) EXCISION UPPER LEFT INNER THIGH WOUND (Left) as a surgical intervention.  The patient's history has been reviewed, patient examined, no change in status, stable for surgery.  I have reviewed the patient's chart and labs.  Questions were answered to the patient's satisfaction.     Caryn Bee P Shamyra Farias

## 2019-09-18 NOTE — Interval H&P Note (Signed)
History and Physical Interval Note:  09/18/2019 8:41 AM  Christopher [name]  has presented today for surgery, with the diagnosis of Infected Nonunion of Pelvis, Left side..  The various methods of treatment have been discussed with the patient and family. After consideration of risks, benefits and other options for treatment, the patient has consented to  Procedure(s): IRRIGATION AND DEBRIDEMENT HIP/ PELVIS WITH WOUND VAC PLACEMENT (Left) APPLICATION OF A-CELL OF EXTREMITY (Left) EXCISION UPPER LEFT INNER THIGH WOUND (Left) as a surgical intervention.  The patient's history has been reviewed, patient examined, no change in status, stable for surgery.  I have reviewed the patient's chart and labs.  Questions were answered to the patient's satisfaction.     Alena Bills Jadence Kinlaw

## 2019-09-19 ENCOUNTER — Telehealth: Payer: Self-pay | Admitting: *Deleted

## 2019-09-19 ENCOUNTER — Inpatient Hospital Stay: Payer: Self-pay

## 2019-09-19 LAB — BASIC METABOLIC PANEL
Anion gap: 6 (ref 5–15)
BUN: 15 mg/dL (ref 6–20)
CO2: 25 mmol/L (ref 22–32)
Calcium: 9.1 mg/dL (ref 8.9–10.3)
Chloride: 107 mmol/L (ref 98–111)
Creatinine, Ser: 0.67 mg/dL (ref 0.61–1.24)
GFR calc Af Amer: >60 mL/min (ref >60)
GFR calc non Af Amer: >60 mL/min (ref >60)
Glucose, Bld: 199 mg/dL — ABNORMAL HIGH (ref 70–99)
Potassium: 4.4 mmol/L (ref 3.5–5.1)
Sodium: 138 mmol/L (ref 135–145)

## 2019-09-19 LAB — CBC
HCT: 34 % — ABNORMAL LOW (ref 39.0–52.0)
Hemoglobin: 10.7 g/dL — ABNORMAL LOW (ref 13.0–17.0)
MCH: 32.5 pg (ref 26.0–34.0)
MCHC: 31.5 g/dL (ref 30.0–36.0)
MCV: 103.3 fL — ABNORMAL HIGH (ref 80.0–100.0)
Platelets: 269 10*3/uL (ref 150–400)
RBC: 3.29 MIL/uL — ABNORMAL LOW (ref 4.22–5.81)
RDW: 12.9 % (ref 11.5–15.5)
WBC: 7.6 10*3/uL (ref 4.0–10.5)
nRBC: 0 % (ref 0.0–0.2)

## 2019-09-19 MED ORDER — SODIUM CHLORIDE 0.9% FLUSH: 10.0000 mL | INTRAVENOUS | Status: DC | PRN

## 2019-09-19 MED ORDER — CHLORHEXIDINE GLUCONATE CLOTH 2 % EX PADS
6.0000 | MEDICATED_PAD | Freq: Every day | CUTANEOUS | Status: DC
  Administered 2019-09-20 – 2019-09-23 (×4): 6 via TOPICAL

## 2019-09-19 NOTE — Progress Notes (Signed)
PT Cancellation Note  Patient Details Name: Christopher [name] MRN: [id number] DOB: 27-Feb-1966   Cancelled Treatment:    Reason Eval/Treat Not Completed: Pain limiting ability to participate Pt refusing therapy session x 2; on first attempt, pt reporting 8/10 L hip pain and RN notified for pain medication. On second attempt, pt requesting to eat. Will re-attempt tomorrow.    Lillia Pauls, PT, DPT Acute Rehabilitation Services Pager [phone] Office [phone]    Norval Morton 09/19/2019, 2:09 PM
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
## 2019-09-19 NOTE — Progress Notes (Signed)
1 Day Post-Op  Subjective: Resting in bed today, reports some pain to left hip - NAD at this time. No acute overnight events. No f/c  No other complaints.  Objective: Vital signs in last 24 hours: Temp:  [97.6 F (36.4 C)-98 F (36.7 C)] 97.7 F (36.5 C) (04/08 0744) Pulse Rate:  [75-86] 75 (04/08 0744) Resp:  [11-18] 17 (04/08 0744) BP: (106-137)/(65-97) 134/90 (04/08 0744) SpO2:  [95 %-99 %] 98 % (04/08 0744) Last BM Date: 09/19/19  Intake/Output from previous day: 04/07 0701 - 04/08 0700 In: 1380 [P.O.:580; I.V.:600; IV Piggyback:200] Out: 3801 [Urine:3651; Drains:150] Intake/Output this shift: Total I/O In: 240 [P.O.:240] Out: -   General appearance: alert, cooperative, no distress and resting in bed Resp: unlabored Incision/Wound: Wound vac in place with good seal noted - 100 cc of serosanguinous fluid in canister.   Lab Results:  CBC Latest Ref Rng & Units 09/19/2019 09/18/2019 09/11/2019  WBC 4.0 - 10.5 K/uL 7.6 6.4 6.2  Hemoglobin 13.0 - 17.0 g/dL 10.7(L) 12.2(L) 13.8  Hematocrit 39.0 - 52.0 % 34.0(L) 38.0(L) 43.8  Platelets 150 - 400 K/uL 269 248 293    BMET Recent Labs    09/18/19 1549 09/19/19 0228  NA  --  138  K  --  4.4  CL  --  107  CO2  --  25  GLUCOSE  --  199*  BUN  --  15  CREATININE 0.64 0.67  CALCIUM  --  9.1   PT/INR No results for input(s): LABPROT, INR in the last 72 hours. ABG No results for input(s): PHART, HCO3 in the last 72 hours.  Invalid input(s): PCO2, PO2  Studies/Results: DG C-Arm 1-60 Min  Result Date: 09/18/2019 CLINICAL DATA:  Irrigation and washout EXAM: DG C-ARM 1-60 MIN; OPERATIVE LEFT HIP WITH PELVIS CONTRAST:  None. FLUOROSCOPY TIME:  Fluoroscopy Time:  31.9 seconds Number of Acquired Spot Images: 6 COMPARISON:  None. FINDINGS: Intraoperative fluoroscopic images of the left hip and pelvis are provided for review demonstrating screw fixation of the left femoral neck and left acetabulum and instrument positioning over  the left ilium. IMPRESSION: Intraoperative fluoroscopic images of the left hip and pelvis are provided for review. Electronically Signed   By: Lauralyn Primes M.D.   On: 09/18/2019 10:30   DG HIP OPERATIVE UNILAT W OR W/O PELVIS LEFT  Result Date: 09/18/2019 CLINICAL DATA:  Irrigation and washout EXAM: DG C-ARM 1-60 MIN; OPERATIVE LEFT HIP WITH PELVIS CONTRAST:  None. FLUOROSCOPY TIME:  Fluoroscopy Time:  31.9 seconds Number of Acquired Spot Images: 6 COMPARISON:  None. FINDINGS: Intraoperative fluoroscopic images of the left hip and pelvis are provided for review demonstrating screw fixation of the left femoral neck and left acetabulum and instrument positioning over the left ilium. IMPRESSION: Intraoperative fluoroscopic images of the left hip and pelvis are provided for review. Electronically Signed   By: Lauralyn Primes M.D.   On: 09/18/2019 10:30   [place] EKG SITE RITE  Result Date: 09/19/2019 If Site Rite image not attached, placement could not be confirmed due to current cardiac rhythm.   Anti-infectives: Anti-infectives (From admission, onward)   Start     Dose/Rate Route Frequency Ordered Stop   09/18/19 1530  metroNIDAZOLE (FLAGYL) tablet 500 mg     500 mg Oral Every 8 hours 09/18/19 1448     09/18/19 1500  ceFEPIme (MAXIPIME) 2 g in sodium chloride 0.9 % 100 mL IVPB     2 g 200 mL/hr over 30 Minutes  Intravenous Every 8 hours 09/18/19 1448     09/18/19 0939  tobramycin (NEBCIN) powder  Status:  Discontinued       As needed 09/18/19 8338 09/18/19 1040   09/18/19 0938  vancomycin (VANCOCIN) powder  Status:  Discontinued       As needed 09/18/19 2505 09/18/19 1040   09/18/19 0645  ceFAZolin (ANCEF) IVPB 2g/100 mL premix     2 g 200 mL/hr over 30 Minutes Intravenous On call to O.R. 09/18/19 3976 09/18/19 7341      Assessment/Plan: s/p Procedure(s): IRRIGATION AND DEBRIDEMENT HIP/ PELVIS WITH WOUND VAC PLACEMENT APPLICATION OF A-CELL OF EXTREMITY  PICC line to be placed for ongoing  cefepime and metronidazole post-discharge - ID following and managing. Cultures pending with no organisms noted currently.  Home wound vac to be ordered via [facility] for ongoing wound vac therapy. Case management consulted for assistance. Wound vac to be changed prior to discharge.  Optimize nutritional status with MV w/ vit c, protein shakes as tolerated Recommend frequent position shift to prevent new pressure wounds.  DVT ppx with lovenox, SCDs.  Call with questions or concerns.    LOS: 1 day    Charlies Constable, PA-C 09/19/2019

## 2019-09-19 NOTE — Progress Notes (Signed)
OT Cancellation Note  Patient Details Name: Christopher [name] MRN: [id number] DOB: 15-Oct-1965   Cancelled Treatment:    Reason Eval/Treat Not Completed: Pain limiting ability to participate;Fatigue/lethargy limiting ability to participate. Pt reporting 8/10 pain in L hip area at rest, reports he is about to call nurse for his regular pain med. Pt with eyes mostly closed "I don't hurt when I'm asleep" "I didn't sleep good last night." Plan to reattempt.   Raynald Kemp, OT Acute Rehabilitation Services Pager: [phone] Office: [phone]  09/19/2019, 12:33 PM
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
## 2019-09-19 NOTE — TOC Transition Note (Signed)
Transition of Care [facility]) - CM/SW Discharge Note   Patient Details  Name: Christopher [name] MRN: [id number] Date of Birth: 09/09/1965  [facility]) CM/SW Contact:  Janae Bridgeman, RN Phone Number: 09/19/2019, 4:39 PM   Clinical Narrative:    Case management called Blossom Hoops, [facility] and sent patient information to her including name, DOB, Dr. Jena Gauss (including email) so that escript could be obtained and KCi wound vac ordered and to be delivered by tomorrow 09/20/19 to the hospital room.   Final next level of care: Home w Home Health Services     Patient Goals and CMS Choice Patient states their goals for this hospitalization and ongoing recovery are:: I'm ready to get better from my infection in my pelvis. CMS Medicare.gov Compare Post Acute Care list provided to:: Patient Choice offered to / list presented to : Patient  Discharge Placement                       Discharge Plan and Services   Discharge Planning Services: CM Consult                      HH Arranged: (Current services with [facility]) HH Agency: [facility]        Social Determinants of Health (SDOH) Interventions     Readmission Risk Interventions Readmission Risk Prevention Plan 09/18/2019 04/06/2019 10/16/2018  Transportation Screening Complete - Complete  PCP or Specialist Appt within 3-5 Days - Complete -  HRI or Home Care Consult - Complete -  Social Work Consult for Recovery Care Planning/Counseling - Complete -  Palliative Care Screening - Not Applicable -  Medication Review Oceanographer) Complete Complete Complete  PCP or Specialist appointment within 3-5 days of discharge Complete - Not Complete  PCP/Specialist Appt Not Complete comments - - Pt discharging to LTAC  HRI or Home Care Consult Complete - Not Complete  HRI or Home Care Consult Pt Refusal Comments - - Pt discharging to LTAC  SW Recovery Care/Counseling Consult  Complete - Not Complete  SW Consult Not Complete Comments - - Pt discharging to [facility]  Palliative Care Screening Complete - Not Applicable  Skilled Nursing Facility Complete - Not Applicable  Some recent data might be hidden

## 2019-09-19 NOTE — Progress Notes (Signed)
Peripherally Inserted Central Catheter Placement  The IV Nurse has discussed with the patient and/or persons authorized to consent for the patient, the purpose of this procedure and the potential benefits and risks involved with this procedure.  The benefits include less needle sticks, lab draws from the catheter, and the patient may be discharged home with the catheter. Risks include, but not limited to, infection, bleeding, blood clot (thrombus formation), and puncture of an artery; nerve damage and irregular heartbeat and possibility to perform a PICC exchange if needed/ordered by physician.  Alternatives to this procedure were also discussed.  Bard Power PICC patient education guide, fact sheet on infection prevention and patient information card has been provided to patient /or left at bedside.    PICC Placement Documentation  PICC Single Lumen 09/19/19 PICC Right Brachial 0 cm 40 cm (Active)  Indication for Insertion or Continuance of Line Prolonged intravenous therapies 09/19/19 1823  Exposed Catheter (cm) 0 cm 09/19/19 1823  Site Assessment Clean;Dry;Intact 09/19/19 1823  Line Status Flushed;Blood return noted 09/19/19 1823  Dressing Type Transparent 09/19/19 1823  Dressing Status Clean;Dry;Other (Comment);Antimicrobial disc in place;Intact 09/19/19 1823  Dressing Intervention New dressing 09/19/19 1823  Dressing Change Due 09/26/19 09/19/19 1823       Reginia Forts Albarece 09/19/2019, 6:24 PM

## 2019-09-19 NOTE — Telephone Encounter (Signed)
Received message from Dr. Ulice Bold on  (09/18/19) that VAC ordered in hospital for patient with home health.//AB/CMA

## 2019-09-19 NOTE — Progress Notes (Signed)
Patient ID: Christopher [name], male   DOB: [birth year], 54 y.o.   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  His most recent operative Gram stain did not show any organisms and his cultures are pending.  PLAN: 1. Continue current antibiotics 2. PICC placement  Active Problems:   Bone infection of pelvis (HCC)   Scheduled Meds: . citalopram  20 mg Oral Daily  . enoxaparin (LOVENOX) injection  40 mg Subcutaneous Q24H  . feeding supplement (PRO-STAT SUGAR FREE 64)  30 mL Oral TID WC  . fentaNYL  1 patch Transdermal Q48H  . gabapentin  300 mg Oral TID  . lidocaine  3 patch Transdermal Q12H  . melatonin  3 mg Oral QHS  . metroNIDAZOLE  500 mg Oral Q8H  . polyethylene glycol  17 g Oral Daily  . QUEtiapine  50 mg Oral QHS  . senna-docusate  2 tablet Oral QHS   Continuous Infusions: . 0.9 % NaCl with KCl 20 mEq / L 100 mL/hr at 09/18/19 1537  . ceFEPime (MAXIPIME) IV 2 g (09/19/19 0500)  . methocarbamol (ROBAXIN) IV     PRN Meds:.albuterol, clonazePAM, HYDROmorphone (DILAUDID) injection, methocarbamol **OR** methocarbamol (ROBAXIN) IV, metoCLOPramide **OR** metoCLOPramide (REGLAN) injection, morphine injection, ondansetron **OR** ondansetron (ZOFRAN) IV, oxyCODONE-acetaminophen   SUBJECTIVE: He tells me that his pain is under reasonably good control.  He is not having any problems tolerating his current antibiotics.  He says that he lost a lot of weight when he was hospitalized last year but his appetite has improved and he has now gained all of that weight back  Review of Systems: Review of Systems  Constitutional: Negative for fever.  Gastrointestinal: Negative for abdominal pain, diarrhea, nausea and vomiting.  Musculoskeletal: Positive for joint pain.    Allergies  Allergen Reactions  . Omnicef  [Cefdinir] Nausea And Vomiting    OBJECTIVE: Vitals:   09/18/19 1837 09/19/19 0004 09/19/19 0506 09/19/19 0744  BP: 135/87 106/65 115/78 134/90  Pulse: 85 84 80 75  Resp: 18 18 18 17   Temp: 97.7 F (36.5 C) 97.6 F (36.4 C) 97.7 F (36.5 C) 97.7 F (36.5 C)  TempSrc: Oral Oral Oral Oral  SpO2: 95% 96% 99% 98%  Weight:      Height:       Body mass index is 19.25 kg/m.  Physical Exam Constitutional:      Comments: He is sitting up in bed eating breakfast.  Psychiatric:        Mood and Affect: Mood normal.     Lab Results Lab Results  Component Value Date   WBC 7.6 09/19/2019   HGB 10.7 (L) 09/19/2019   HCT 34.0 (L) 09/19/2019   MCV 103.3 (H) 09/19/2019   PLT 269 09/19/2019    Lab Results  Component Value Date   CREATININE 0.67 09/19/2019   BUN 15 09/19/2019   NA 138 09/19/2019   K 4.4 09/19/2019   CL 107 09/19/2019   CO2 25 09/19/2019    Lab Results  Component Value Date   ALT 18 09/18/2019   AST 17 09/18/2019   ALKPHOS 145 (H) 09/18/2019   BILITOT 0.3 09/18/2019     Microbiology: Recent Results (from the past 240 hour(s))  SARS CORONAVIRUS 2 (TAT 6-24 HRS) Nasopharyngeal Nasopharyngeal Swab     Status: None   Collection Time: 09/14/19  1:37 PM   Specimen: Nasopharyngeal Swab  Result Value Ref Range Status   SARS Coronavirus 2 NEGATIVE NEGATIVE Final    Comment: (NOTE) SARS-CoV-2 target nucleic acids are NOT DETECTED. The SARS-CoV-2 RNA is generally detectable in upper and lower respiratory specimens during the acute phase of infection. Negative results do not preclude SARS-CoV-2 infection, do not rule out co-infections with other pathogens, and should not be used as the sole basis for treatment or other patient management decisions. Negative results must be combined with clinical observations, patient history, and epidemiological information. The expected result is Negative. Fact Sheet for  Patients: HairSlick.no Fact Sheet for Healthcare Providers: quierodirigir.com This test is not yet approved or cleared by the [place] FDA and  has been authorized for detection and/or diagnosis of SARS-CoV-2 by FDA under an Emergency Use Authorization (EUA). This EUA will remain  in effect (meaning this test can be used) for the duration of the COVID-19 declaration under Section 56 4(b)(1) of the Act, 21 U.S.C. section 360bbb-3(b)(1), unless the authorization is terminated or revoked sooner. Performed at [facility], [address] [place], [place] [address]   Aerobic/Anaerobic Culture (surgical/deep wound)     Status: None (Preliminary result)   Collection Time: 09/18/19  9:29 AM   Specimen: PATH Other; Tissue  Result Value Ref Range Status   Specimen Description WOUND  Final   Special Requests UPPER LEFT INNER THIGH SPEC A  Final   Gram Stain   Final    FEW WBC PRESENT,BOTH PMN AND MONONUCLEAR NO ORGANISMS SEEN    Culture   Final    CULTURE REINCUBATED FOR BETTER GROWTH Performed at [facility], [address], [place], [place] [address]    Report Status PENDING  Incomplete  Aerobic/Anaerobic Culture (surgical/deep wound)     Status: None (Preliminary result)   Collection Time: 09/18/19  9:31 AM   Specimen: PATH Other; Tissue  Result Value Ref Range Status   Specimen Description WOUND  Final   Special Requests NONE PELVIC NON UNION SPEC B  Final   Gram Stain   Final    FEW WBC PRESENT,BOTH PMN AND MONONUCLEAR NO ORGANISMS SEEN    Culture   Final    CULTURE REINCUBATED FOR BETTER GROWTH Performed at [facility], [address] [place], [place] [address]    Report Status PENDING  Incomplete  Aerobic/Anaerobic Culture (surgical/deep wound)     Status: None (Preliminary result)   Collection Time: 09/18/19  9:31 AM   Specimen: PATH Other; Tissue  Result Value Ref Range Status   Specimen  Description WOUND  Final   Special Requests NONE PELVIC BONE 1 SPEC C  Final   Gram Stain   Final    RARE WBC PRESENT,BOTH PMN AND MONONUCLEAR NO ORGANISMS SEEN    Culture   Final    CULTURE REINCUBATED FOR BETTER GROWTH Performed at [facility], [address], [place], [place] [address]    Report Status PENDING  Incomplete  Aerobic/Anaerobic Culture (surgical/deep wound)     Status: None (Preliminary result)   Collection Time: 09/18/19  9:32 AM   Specimen: PATH Other; Tissue  Result Value Ref Range Status   Specimen Description WOUND  Final   Special Requests NONE PELVIC BONE 2 SPEC D  Final   Gram Stain   Final  RARE WBC PRESENT,BOTH PMN AND MONONUCLEAR NO ORGANISMS SEEN    Culture   Final    CULTURE REINCUBATED FOR BETTER GROWTH Performed at [facility], [address] [place], [place] [address]    Report Status PENDING  Incomplete    Cliffton Asters, MD [facility] for Infectious Disease [facility] [phone] pager   [phone] cell 09/19/2019, 11:07 AM

## 2019-09-20 ENCOUNTER — Encounter: Payer: Self-pay | Admitting: *Deleted

## 2019-09-20 ENCOUNTER — Telehealth: Payer: Self-pay | Admitting: *Deleted

## 2019-09-20 DIAGNOSIS — M869 Osteomyelitis, unspecified: Secondary | ICD-10-CM

## 2019-09-20 DIAGNOSIS — Z95828 Presence of other vascular implants and grafts: Secondary | ICD-10-CM

## 2019-09-20 LAB — BASIC METABOLIC PANEL
Anion gap: 9 (ref 5–15)
BUN: 17 mg/dL (ref 6–20)
CO2: 26 mmol/L (ref 22–32)
Calcium: 9.2 mg/dL (ref 8.9–10.3)
Chloride: 105 mmol/L (ref 98–111)
Creatinine, Ser: 0.57 mg/dL — ABNORMAL LOW (ref 0.61–1.24)
GFR calc Af Amer: >60 mL/min (ref >60)
GFR calc non Af Amer: >60 mL/min (ref >60)
Glucose, Bld: 95 mg/dL (ref 70–99)
Potassium: 4 mmol/L (ref 3.5–5.1)
Sodium: 140 mmol/L (ref 135–145)

## 2019-09-20 LAB — CBC
HCT: 35.4 % — ABNORMAL LOW (ref 39.0–52.0)
Hemoglobin: 10.8 g/dL — ABNORMAL LOW (ref 13.0–17.0)
MCH: 31.6 pg (ref 26.0–34.0)
MCHC: 30.5 g/dL (ref 30.0–36.0)
MCV: 103.5 fL — ABNORMAL HIGH (ref 80.0–100.0)
Platelets: 242 10*3/uL (ref 150–400)
RBC: 3.42 MIL/uL — ABNORMAL LOW (ref 4.22–5.81)
RDW: 13 % (ref 11.5–15.5)
WBC: 5.1 10*3/uL (ref 4.0–10.5)
nRBC: 0 % (ref 0.0–0.2)

## 2019-09-20 MED ORDER — FENTANYL 50 MCG/HR TD PT72: 1.0000 | MEDICATED_PATCH | TRANSDERMAL | 0 refills | Status: DC

## 2019-09-20 MED ORDER — OXYCODONE-ACETAMINOPHEN 5-325 MG PO TABS: 1.0000 | ORAL_TABLET | ORAL | 0 refills | Status: DC | PRN

## 2019-09-20 NOTE — Progress Notes (Signed)
Portable equipment notified of need for air mattress bed
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
## 2019-09-20 NOTE — Telephone Encounter (Signed)
Christopher [name] called to request refill on [name] medication Fentanyl and Oxycodone..  He has had surgery and will be coming home Monday 4/12 and will be out of his medication.  He also has appointment to see Dr Berline Chough on Wednesday 09/25/19. Per PMP his last fills were  08/16/2019 Oxycodone-Acetaminophen 5-325 #150.00   08/14/2019 Fentanyl 50 Mcg/hr Patch #15.00
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
## 2019-09-20 NOTE — Telephone Encounter (Signed)
Notified medication was sent in to pharmacy.

## 2019-09-20 NOTE — Progress Notes (Signed)
Frequent calls for pain medication.  Medicated as ordered
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
## 2019-09-20 NOTE — Progress Notes (Signed)
Patient placed on air mattress

## 2019-09-20 NOTE — Progress Notes (Signed)
Left hip dressing- wound vac was reading leakage and reinforced.  No problems noted presentl
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
## 2019-09-20 NOTE — Plan of Care (Signed)
  Problem: Education: Goal: Knowledge of General Education information will improve Description: Including pain rating scale, medication(s)/side effects and non-pharmacologic comfort measures Outcome: Progressing   Problem: Nutrition: Goal: Adequate nutrition will be maintained Outcome: Progressing   Problem: Safety: Goal: Ability to remain free from injury will improve Outcome: Progressing   

## 2019-09-20 NOTE — Progress Notes (Signed)
[facility]  Date of Admission:  09/18/2019     Total days of antibiotics 3         ASSESSMENT:  Christopher [name] is POD #2 from Irrigation and debridement of left hip/pelvis with wound vac placement and has remained afebrile and clinical stable. Cultures are growing gram positive rods in 3/4 surgical specimens. PICC line inserted yesterday. Will need prolonged course of antibiotics with final recommendations pending species identification. Tolerating the cefepime and metronidazole with no adverse side effects. Continue cefepime and metronidazole with narrowing as able pending culture results. Wound care per Orthopedics and Plastic Surgery.   PLAN:  1. Continue Cefepime and metronidazole.  2. Wound care per Orthopedics and Plastic Surgery. 3. Monitor cultures for bacteria identification and narrow antibiotic therapy as able.   Active Problems:   Bone infection of pelvis (HCC)   . Chlorhexidine Gluconate Cloth  6 each Topical Daily  . citalopram  20 mg Oral Daily  . enoxaparin (LOVENOX) injection  40 mg Subcutaneous Q24H  . feeding supplement (PRO-STAT SUGAR FREE 64)  30 mL Oral TID WC  . fentaNYL  1 patch Transdermal Q48H  . gabapentin  300 mg Oral TID  . lidocaine  3 patch Transdermal Q12H  . melatonin  3 mg Oral QHS  . metroNIDAZOLE  500 mg Oral Q8H  . polyethylene glycol  17 g Oral Daily  . QUEtiapine  50 mg Oral QHS  . senna-docusate  2 tablet Oral QHS    SUBJECTIVE:  Afebrile overnight. Pain woke him up this morning but is currently adequately managed. Seated eating breakfast. Gram negative rods growing on 3/4 surgical specimens.   Allergies  Allergen Reactions  . Omnicef [Cefdinir] Nausea And Vomiting     Review of Systems: Review of Systems  Constitutional: Negative for chills, fever and weight loss.  Respiratory: Negative for cough, shortness of breath and wheezing.   Cardiovascular: Negative for chest pain and leg swelling.    Gastrointestinal: Negative for abdominal pain, constipation, diarrhea, nausea and vomiting.  Musculoskeletal: Positive for back pain.  Skin: Negative for rash.      OBJECTIVE: Vitals:   09/19/19 2208 09/20/19 0200 09/20/19 0357 09/20/19 0759  BP: 125/73  (!) 163/83 (!) 154/93  Pulse: 74  79 91  Resp: 18  20 17   Temp: (!) 97.5 F (36.4 C)  (!) 97.5 F (36.4 C) 97.9 F (36.6 C)  TempSrc: Oral  Oral Oral  SpO2: 96% 97% 98% 96%  Weight:      Height:       Body mass index is 19.25 kg/m.  Physical Exam Constitutional:      General: Christopher [name] is not in acute distress.    Appearance: Christopher [name] is well-developed.     Comments: Seated in bed eating breakfast; pleasant.   Cardiovascular:     Rate and Rhythm: Normal rate and regular rhythm.     Heart sounds: Normal heart sounds.  Pulmonary:     Effort: Pulmonary effort is normal.     Breath sounds: Normal breath sounds.  Skin:    General: Skin is warm and dry.  Neurological:     Mental Status: Christopher [name] is alert and oriented to person, place, and time.  Psychiatric:        Behavior: Behavior normal.        Thought Content: Thought content normal.        Judgment: Judgment normal.     Lab Results Lab Results  Component  Value Date   WBC 5.1 09/20/2019   HGB 10.8 (L) 09/20/2019   HCT 35.4 (L) 09/20/2019   MCV 103.5 (H) 09/20/2019   PLT 242 09/20/2019    Lab Results  Component Value Date   CREATININE 0.57 (L) 09/20/2019   BUN 17 09/20/2019   NA 140 09/20/2019   K 4.0 09/20/2019   CL 105 09/20/2019   CO2 26 09/20/2019    Lab Results  Component Value Date   ALT 18 09/18/2019   AST 17 09/18/2019   ALKPHOS 145 (H) 09/18/2019   BILITOT 0.3 09/18/2019     Microbiology: Recent Results (from the past 240 hour(s))  SARS CORONAVIRUS 2 (TAT 6-24 HRS) Nasopharyngeal Nasopharyngeal Swab     Status: None   Collection Time: 09/14/19  1:37 PM   Specimen: Nasopharyngeal Swab  Result Value Ref Range Status   SARS Coronavirus 2 NEGATIVE  NEGATIVE Final    Comment: (NOTE) SARS-CoV-2 target nucleic acids are NOT DETECTED. The SARS-CoV-2 RNA is generally detectable in upper and lower respiratory specimens during the acute phase of infection. Negative results do not preclude SARS-CoV-2 infection, do not rule out co-infections with other pathogens, and should not be used as the sole basis for treatment or other patient management decisions. Negative results must be combined with clinical observations, patient history, and epidemiological information. The expected result is Negative. Fact Sheet for Patients: SugarRoll.be Fact Sheet for Healthcare Providers: https://www.woods-mathews.com/ This test is not yet approved or cleared by the [place] FDA and  has been authorized for detection and/or diagnosis of SARS-CoV-2 by FDA under an Emergency Use Authorization (EUA). This EUA will remain  in effect (meaning this test can be used) for the duration of the COVID-19 declaration under Section 56 4(b)(1) of the Act, 21 U.S.C. section 360bbb-3(b)(1), unless the authorization is terminated or revoked sooner. Performed at [facility], [place] [address], [place], [place] [address]   Aerobic/Anaerobic Culture (surgical/deep wound)     Status: None (Preliminary result)   Collection Time: 09/18/19  9:29 AM   Specimen: PATH Other; Tissue  Result Value Ref Range Status   Specimen Description WOUND  Final   Special Requests UPPER LEFT INNER THIGH SPEC A  Final   Gram Stain   Final    FEW WBC PRESENT,BOTH PMN AND MONONUCLEAR NO ORGANISMS SEEN    Culture   Final    CULTURE REINCUBATED FOR BETTER GROWTH Performed at [facility], [place] [address] [place], [place] [address]    Report Status PENDING  Incomplete  Aerobic/Anaerobic Culture (surgical/deep wound)     Status: None (Preliminary result)   Collection Time: 09/18/19  9:31 AM   Specimen: PATH Other; Tissue  Result Value Ref  Range Status   Specimen Description WOUND  Final   Special Requests NONE PELVIC NON UNION SPEC B  Final   Gram Stain   Final    FEW WBC PRESENT,BOTH PMN AND MONONUCLEAR NO ORGANISMS SEEN    Culture   Final    RARE GRAM NEGATIVE RODS IDENTIFICATION AND SUSCEPTIBILITIES TO FOLLOW CULTURE REINCUBATED FOR BETTER GROWTH CRITICAL RESULT CALLED TO, READ BACK BY AND VERIFIED WITH: RN Dirk Dress [id number] AT 27 BY CM Performed at [facility], [place] [address] [place], [place] [address]    Report Status PENDING  Incomplete  Aerobic/Anaerobic Culture (surgical/deep wound)     Status: None (Preliminary result)   Collection Time: 09/18/19  9:31 AM   Specimen: PATH Other; Tissue  Result Value Ref Range Status   Specimen Description WOUND  Final   Special Requests NONE PELVIC BONE 1 SPEC C  Final   Gram Stain   Final    RARE WBC PRESENT,BOTH PMN AND MONONUCLEAR NO ORGANISMS SEEN    Culture   Final    RARE GRAM NEGATIVE RODS CULTURE REINCUBATED FOR BETTER GROWTH IDENTIFICATION AND SUSCEPTIBILITIES TO FOLLOW CRITICAL RESULT CALLED TO, READ BACK BY AND VERIFIED WITH: RN Raeanne Barry [id number] AT 1408 BY CM Performed at [facility], [address], [place], [place] [address]    Report Status PENDING  Incomplete  Aerobic/Anaerobic Culture (surgical/deep wound)     Status: None (Preliminary result)   Collection Time: 09/18/19  9:32 AM   Specimen: PATH Other; Tissue  Result Value Ref Range Status   Specimen Description WOUND  Final   Special Requests NONE PELVIC BONE 2 SPEC D  Final   Gram Stain   Final    RARE WBC PRESENT,BOTH PMN AND MONONUCLEAR NO ORGANISMS SEEN    Culture   Final    RARE GRAM NEGATIVE RODS IDENTIFICATION AND SUSCEPTIBILITIES TO FOLLOW Performed at [facility], [address], [place], [place] [address]    Report Status PENDING  Incomplete     Marcos Eke, NP [facility]  09/20/2019  9:40  AM

## 2019-09-20 NOTE — Progress Notes (Signed)
Lillia Pauls PT notified of the need that the patient has been medicated for pain and needs to be given therapy again if possible.  The patient has not been out of bed and needs to be transferred to low air mattress bed.  She agrees that she will see him again since he has been recently medicated for pain.

## 2019-09-20 NOTE — Plan of Care (Signed)
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
## 2019-09-21 DIAGNOSIS — B9620 Unspecified Escherichia coli [E. coli] as the cause of diseases classified elsewhere: Secondary | ICD-10-CM

## 2019-09-21 DIAGNOSIS — B9689 Other specified bacterial agents as the cause of diseases classified elsewhere: Secondary | ICD-10-CM

## 2019-09-21 LAB — BASIC METABOLIC PANEL
Anion gap: 11 (ref 5–15)
BUN: 17 mg/dL (ref 6–20)
CO2: 26 mmol/L (ref 22–32)
Calcium: 9.5 mg/dL (ref 8.9–10.3)
Chloride: 101 mmol/L (ref 98–111)
Creatinine, Ser: 0.57 mg/dL — ABNORMAL LOW (ref 0.61–1.24)
GFR calc Af Amer: >60 mL/min (ref >60)
GFR calc non Af Amer: >60 mL/min (ref >60)
Glucose, Bld: 112 mg/dL — ABNORMAL HIGH (ref 70–99)
Potassium: 3.5 mmol/L (ref 3.5–5.1)
Sodium: 138 mmol/L (ref 135–145)

## 2019-09-21 MED ORDER — VANCOMYCIN HCL 1500 MG/300ML IV SOLN
1500.0000 mg | Freq: Once | INTRAVENOUS | Status: AC
  Administered 2019-09-21: 1500 mg via INTRAVENOUS
  Filled 2019-09-21 (×2): qty 300

## 2019-09-21 MED ORDER — SODIUM CHLORIDE 0.9 % IV SOLN
2.0000 g | INTRAVENOUS | Status: DC
  Administered 2019-09-21: 2 g via INTRAVENOUS
  Filled 2019-09-21: qty 20

## 2019-09-21 MED ORDER — VANCOMYCIN HCL IN DEXTROSE 1-5 GM/200ML-% IV SOLN
1000.0000 mg | Freq: Two times a day (BID) | INTRAVENOUS | Status: DC
  Administered 2019-09-22 – 2019-09-23 (×3): 1000 mg via INTRAVENOUS
  Filled 2019-09-21 (×5): qty 200

## 2019-09-21 NOTE — Progress Notes (Addendum)
PHARMACY CONSULT NOTE FOR:  OUTPATIENT  PARENTERAL ANTIBIOTIC THERAPY (OPAT)  Indication: osteomyelitis (pelvic) Regimen: cefepime 2g IV q8h and vancomycin 1000mg  IV q12h End date: 10/28/19  IV antibiotic discharge orders are pended. To discharging provider:  please sign these orders via discharge navigator,  Select New Orders & click on the button choice - Manage This Unsigned Work.     Thank you for allowing pharmacy to be a part of this patient's care.   10/30/19, PharmD PGY2 Pharmacy Resident Phone [phone]  09/21/2019   4:21 PM

## 2019-09-21 NOTE — Progress Notes (Signed)
Pharmacy Antibiotic Note  Christopher [name] is a 54 y.o. 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  Today, patient is afebrile. Last WBC WNL, SCr stable at 0.57  Plan: Vancomycin 1500mg  IV once then vancomycin 1000mg  IV q12h (goal 400-550, est AUC 450, SCr 0.8) Ceftriaxone 2g IV q24h Planning for total 6 weeks of antibiotic therapy with end date 10/28/19 (OPAT in place; f/u vanc levels at steady state for need to adjust dose on OPAT orders) Monitor clinical picture, renal function, vanc levels at Css; F/U C&S   Height: 6\' 2"  (188 cm) Weight: 68 kg (149 lb 14.6 oz) IBW/kg (Calculated) : 82.2  Temp (24hrs), Avg:98.2 F (36.8 C), Min:97.7 F (36.5 C), Max:98.8 F (37.1 C)  Recent Labs  Lab 09/18/19 1549 09/19/19 0228 09/20/19 0406 09/21/19 0310  WBC 6.4 7.6 5.1  --   CREATININE 0.64 0.67 0.57* 0.57*    Estimated Creatinine Clearance: 103.9 mL/min (A) (by C-G formula based on SCr of 0.57 mg/dL (L)).    Allergies  Allergen Reactions  . Omnicef [Cefdinir] Nausea And Vomiting    Antimicrobials this admission: vanc 4/10 > (5/17) ceftriaxone 4/10 > (5/17) cefazolin 4/7  x1 cefepime 4/7>4/10  Microbiology results: 4/7 Wound cx (thigh): rare Ecoli; few corynebacterium striatum 4/7 Fungal cx :none observed 4/7 pelvic bone cx: rare Ecoli; rare corynebacterium striatum   6/7, PharmD PGY2 Pharmacy Resident Phone [phone]  09/21/2019   3:52 PM

## 2019-09-21 NOTE — Plan of Care (Signed)
  Problem: Pain Managment: Goal: General experience of comfort will improve Outcome: Progressing   
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
## 2019-09-21 NOTE — Progress Notes (Signed)
Pt states that he only wants one lidocaine patch on his left shoulder. Three patches already opened and scanned, ready to administer. MAR would not accept less than ordered dose. Wasted two patches in pyxis with Biochemist, clinical.
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
## 2019-09-22 MED ORDER — VANCOMYCIN IV (FOR PTA / DISCHARGE USE ONLY): 1000.0000 mg | Freq: Two times a day (BID) | INTRAVENOUS | 0 refills | Status: AC

## 2019-09-22 MED ORDER — CEFTRIAXONE IV (FOR PTA / DISCHARGE USE ONLY): 2.0000 g | INTRAVENOUS | 0 refills | Status: DC

## 2019-09-22 MED ORDER — SODIUM CHLORIDE 0.9 % IV SOLN
2.0000 g | Freq: Three times a day (TID) | INTRAVENOUS | Status: DC
  Administered 2019-09-22 – 2019-09-23 (×4): 2 g via INTRAVENOUS
  Filled 2019-09-22 (×4): qty 2

## 2019-09-22 NOTE — Plan of Care (Signed)
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
## 2019-09-22 NOTE — Plan of Care (Signed)
  Problem: Clinical Measurements: Goal: Respiratory complications will improve Outcome: Progressing   Problem: Pain Managment: Goal: General experience of comfort will improve Outcome: Not Progressing   

## 2019-09-22 NOTE — Plan of Care (Signed)
  Problem: Education: Goal: Knowledge of General Education information will improve Description: Including pain rating scale, medication(s)/side effects and non-pharmacologic comfort measures Outcome: Progressing   Problem: Nutrition: Goal: Adequate nutrition will be maintained Outcome: Progressing   Problem: Coping: Goal: Level of anxiety will decrease Outcome: Progressing   Problem: Pain Managment: Goal: General experience of comfort will improve Outcome: Progressing   Problem: Activity: Goal: Risk for activity intolerance will decrease Outcome: Progressing

## 2019-09-22 NOTE — Progress Notes (Signed)
Pharmacy Antibiotic Note  Christopher [name] is a 54 y.o. 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  Today, patient remains afebrile but with increasing nausea and vomiting. Thought to be due to ceftriaxone (antibiotic added yesterday), so we will switch back to cefepime. OPAT orders updated to reflect current antibiotic regimen.  Plan: Continue vancomycin 1000mg  IV q12h (goal 400-550, est AUC 450, using SCr 0.8) Cefepime 2g IV q8h Planning for total 6 weeks of antibiotic therapy with end date 10/28/19 (OPAT in place; f/u vanc levels at steady state for need to adjust dose on OPAT orders) Monitor clinical picture, renal function, vanc levels at Css   Height: 6\' 2"  (188 cm) Weight: 68 kg (149 lb 14.6 oz) IBW/kg (Calculated) : 82.2  Temp (24hrs), Avg:97.8 F (36.6 C), Min:97.5 F (36.4 C), Max:98.3 F (36.8 C)  Recent Labs  Lab 09/18/19 1549 09/19/19 0228 09/20/19 0406 09/21/19 0310  WBC 6.4 7.6 5.1  --   CREATININE 0.64 0.67 0.57* 0.57*    Estimated Creatinine Clearance: 103.9 mL/min (A) (by C-G formula based on SCr of 0.57 mg/dL (L)).    Allergies  Allergen Reactions  . Omnicef [Cefdinir] Nausea And Vomiting    Antimicrobials this admission: vanc 4/10 > (5/17) ceftriaxone 4/10 > 4/11 cefazolin 4/7  x1 cefepime 4/7>4/10, 4/11 >(5/17)  Microbiology results: 4/7 Wound cx (thigh): rare Ecoli; few corynebacterium striatum 4/7 Fungal cx :none observed 4/7 pelvic bone cx: rare Ecoli; rare corynebacterium striatum   6/7, PharmD PGY2 Pharmacy Resident Phone [phone]  09/22/2019   12:40 PM

## 2019-09-23 ENCOUNTER — Telehealth: Payer: Self-pay | Admitting: Plastic Surgery

## 2019-09-23 LAB — AEROBIC/ANAEROBIC CULTURE W GRAM STAIN (SURGICAL/DEEP WOUND)

## 2019-09-23 LAB — VANCOMYCIN, RANDOM: Vancomycin Rm: 12

## 2019-09-23 LAB — VANCOMYCIN, PEAK: Vancomycin Pk: 25 ug/mL — ABNORMAL LOW (ref 30–40)

## 2019-09-23 MED ORDER — ASPIRIN EC 325 MG PO TBEC: 325.0000 mg | DELAYED_RELEASE_TABLET | Freq: Every day | ORAL | 0 refills | Status: AC

## 2019-09-23 MED ORDER — CEFEPIME IV (FOR PTA / DISCHARGE USE ONLY): 2.0000 g | Freq: Three times a day (TID) | INTRAVENOUS | 0 refills | Status: AC

## 2019-09-23 MED ORDER — HEPARIN SOD (PORK) LOCK FLUSH 100 UNIT/ML IV SOLN
250.0000 [IU] | INTRAVENOUS | Status: AC | PRN
  Administered 2019-09-23: 250 [IU]
  Filled 2019-09-23: qty 2.5
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
## 2019-09-23 NOTE — Progress Notes (Signed)
Pt given discharge instructions and gone over with him. He verbalized understanding. Wound vac changed over to [employer] vac. HH RN will change wound vac dressing tomorrow. All pt belongings gathered to be sent home. Transport van transported pt home.

## 2019-09-23 NOTE — TOC Transition Note (Signed)
Transition of Care [facility]) - CM/SW Discharge Note   Patient Details  Name: Christopher [name] MRN: [id number] Date of Birth: 22-Oct-1965  Transition of Care [facility]) CM/SW Contact:  Janae Bridgeman, RN Phone Number: 09/23/2019, 4:33 PM   Clinical Narrative:     Case management spoke with pharmacy this morning and Vancomycin peak and random sample to be drawn by IV therapy.  Spoke with workman's comp nurse, Sheffield Slider, and Sheffield Slider called for [employer] transport for patient's discharge back to home this afternoon.  I spoke with the patient's wife and she is aware of patient's transfer back home today.  Waiting on insurance authorization to be assured that [facility] has all the dosage information and prescriptions for patient's discharge.    09/23/2019 - Transport van coming to transport the patient back home.  Pharmacy doseges and insurance authorization clear for patient to be discharged home.  [facility]'s [facility].  Next RN [facility] visit to be tomorrow.  Final next level of care: Home w Home Health Services     Patient Goals and CMS Choice Patient states their goals for this hospitalization and ongoing recovery are:: I'm ready to get better from my infection in my pelvis. CMS Medicare.gov Compare Post Acute Care list provided to:: Patient Choice offered to / list presented to : Patient  Discharge Placement                       Discharge Plan and Services   Discharge Planning Services: CM Consult                      HH Arranged: (Current services with [facility]) HH Agency: [facility]        Social Determinants of Health (SDOH) Interventions     Readmission Risk Interventions Readmission Risk Prevention Plan 09/18/2019 04/06/2019 10/16/2018  Transportation Screening Complete - Complete  PCP or Specialist Appt within 3-5 Days - Complete -  HRI or Home Care Consult - Complete -  Social Work Consult  for Recovery Care Planning/Counseling - Complete -  Palliative Care Screening - Not Applicable -  Medication Review Ocean[facility]) Complete Complete Complete  PCP or Specialist appointment within 3-5 days of discharge Complete - Not Complete  PCP/Specialist Appt Not Complete comments - - Pt discharging to LTAC  HRI or Home Care Consult Complete - Not Complete  HRI or Home Care Consult Pt Refusal Comments - - Pt discharging to LTAC  SW Recovery Care/Counseling Consult Complete - Not Complete  SW Consult Not Complete Comments - - Pt discharging to [facility]  Palliative Care Screening Complete - Not Applicable  Skilled Nursing Facility Complete - Not Applicable  Some recent data might be hidden

## 2019-09-23 NOTE — Telephone Encounter (Signed)
Marcelino Duster, RN with [facility], calling regarding Christopher [name]. He is being discharged today and she wanted to confirm whether she should change wound vac before he is discharged. Please call her back at [phone] to advise

## 2019-09-23 NOTE — Plan of Care (Signed)
  Problem: Activity: Goal: Risk for activity intolerance will decrease Outcome: Adequate for Discharge   Problem: Coping: Goal: Level of anxiety will decrease Outcome: Adequate for Discharge   Problem: Pain Managment: Goal: General experience of comfort will improve Outcome: Adequate for Discharge   

## 2019-09-23 NOTE — Progress Notes (Signed)
Pt BP still elevated, gave pain meds to help pt relax. Pt gets worked up easily when woken up for daily care. Will continue to monitor and pass info on to next shift.
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
## 2019-09-23 NOTE — Telephone Encounter (Signed)
He can be d/c, should have vac changed by home health tomorrow. Will call michelle RN and discuss

## 2019-09-23 NOTE — Progress Notes (Signed)
Pharmacy Antibiotic Note  Christopher [name] is a 54 y.o. male admitted on 09/18/2019 with osteomyelitis.  Pharmacy has been consulted for vancomycin and cefepime dosing.  Vancomycin levels drawn after the 4th dose today given planning discharge today. Calculated AUC is low. Patient with excellent renal function, UOP 1.30ml/kg/hr.   4/12 1014 VP = 25 4/12 1350 VR = 1 AUC = 318 (goal 400-550) If increase dose to 1500 Q12 hr, calcAUC = 474.5   Home infusion orders already sent to [facility]. Spoke with Jeri Modena about increasing to the suggested dose above and she will let the [facility] pharmacist know.   Plan: Increase vancomycin to 1500mg  Q12 hr Continue Cefepime 2g Q8hr  Last day of antibiotics = 10/28/19  Discharge today with [facility]   Height: 6\' 2"  (188 cm) Weight: 68 kg (149 lb 14.6 oz) IBW/kg (Calculated) : 82.2  Temp (24hrs), Avg:97.9 F (36.6 C), Min:97.4 F (36.3 C), Max:98.8 F (37.1 C)  Recent Labs  Lab 09/18/19 1549 09/19/19 0228 09/20/19 0406 09/21/19 0310 09/23/19 1014 09/23/19 1351  WBC 6.4 7.6 5.1  --   --   --   CREATININE 0.64 0.67 0.57* 0.57*  --   --   VANCOPEAK  --   --   --   --  25*  --   VANCORANDOM  --   --   --   --   --  12    Estimated Creatinine Clearance: 103.9 mL/min (A) (by C-G formula based on SCr of 0.57 mg/dL (L)).    Allergies  Allergen Reactions  . Omnicef [Cefdinir] Nausea And Vomiting    Antimicrobials this admission: vanc 4/10 > (5/17) ceftriaxone 4/10 > 4/11 cefazolin 4/7  x1 cefepime 4/7>4/10, 4/11>(5/17)  Microbiology 4/7 Wound cx (thigh): rare Ecoli; few corynebacterium striatum 4/7 Fungal cx :none observed 4/7 pelvic bone cx: rare Ecoli; rare corynebacterium striatum  Thank you for allowing pharmacy to be a part of this patient's care.  6/7, PharmD, BCPS, BCCP Clinical Pharmacist  Please check AMION for all [facility] phone numbers After 10:00 PM, call Main Pharmacy [phone]

## 2019-09-23 NOTE — Care Management Important Message (Signed)
Important Message  Patient Details  Name: JAKING THAYER MRN: [id number] Date of Birth: 1966/02/23   Medicare Important Message Given:  Yes     Dorena Bodo 09/23/2019, 4:08 PM

## 2019-09-24 LAB — AEROBIC/ANAEROBIC CULTURE W GRAM STAIN (SURGICAL/DEEP WOUND)

## 2019-09-25 ENCOUNTER — Encounter
Payer: No Typology Code available for payment source | Attending: Physical Medicine and Rehabilitation | Admitting: Physical Medicine and Rehabilitation

## 2019-09-25 ENCOUNTER — Telehealth: Payer: Self-pay | Admitting: Plastic Surgery

## 2019-09-25 ENCOUNTER — Encounter: Payer: Self-pay | Admitting: Physical Medicine and Rehabilitation

## 2019-09-25 VITALS — BP 157/89 | HR 73 | Temp 98.6°F | Ht 75.0 in | Wt 152.0 lb

## 2019-09-25 DIAGNOSIS — F419 Anxiety disorder, unspecified: Secondary | ICD-10-CM

## 2019-09-25 DIAGNOSIS — S8391XA Sprain of unspecified site of right knee, initial encounter: Secondary | ICD-10-CM | POA: Diagnosis not present

## 2019-09-25 DIAGNOSIS — T07XXXA Unspecified multiple injuries, initial encounter: Secondary | ICD-10-CM | POA: Diagnosis present

## 2019-09-25 DIAGNOSIS — F411 Generalized anxiety disorder: Secondary | ICD-10-CM | POA: Diagnosis not present

## 2019-09-25 DIAGNOSIS — F329 Major depressive disorder, single episode, unspecified: Secondary | ICD-10-CM

## 2019-09-25 DIAGNOSIS — F32A Depression, unspecified: Secondary | ICD-10-CM

## 2019-09-25 DIAGNOSIS — G8921 Chronic pain due to trauma: Secondary | ICD-10-CM | POA: Insufficient documentation

## 2019-09-25 DIAGNOSIS — M792 Neuralgia and neuritis, unspecified: Secondary | ICD-10-CM | POA: Diagnosis not present

## 2019-09-25 DIAGNOSIS — M86652 Other chronic osteomyelitis, left thigh: Secondary | ICD-10-CM | POA: Diagnosis not present

## 2019-09-25 DIAGNOSIS — Z452 Encounter for adjustment and management of vascular access device: Secondary | ICD-10-CM | POA: Insufficient documentation

## 2019-09-25 DIAGNOSIS — Z9359 Other cystostomy status: Secondary | ICD-10-CM | POA: Diagnosis present

## 2019-09-25 DIAGNOSIS — S31829D Unspecified open wound of left buttock, subsequent encounter: Secondary | ICD-10-CM | POA: Insufficient documentation

## 2019-09-25 DIAGNOSIS — Z79891 Long term (current) use of opiate analgesic: Secondary | ICD-10-CM | POA: Diagnosis present

## 2019-09-25 DIAGNOSIS — G894 Chronic pain syndrome: Secondary | ICD-10-CM

## 2019-09-25 DIAGNOSIS — Z933 Colostomy status: Secondary | ICD-10-CM | POA: Diagnosis present

## 2019-09-25 DIAGNOSIS — N200 Calculus of kidney: Secondary | ICD-10-CM | POA: Insufficient documentation

## 2019-09-25 MED ORDER — PAROXETINE HCL 20 MG PO TABS: 20.0000 mg | ORAL_TABLET | Freq: Every day | ORAL | 5 refills | Status: DC

## 2019-09-25 MED ORDER — CLONAZEPAM 0.5 MG PO TABS: 0.2500 mg | ORAL_TABLET | Freq: Three times a day (TID) | ORAL | 3 refills | Status: DC | PRN
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
## 2019-09-25 NOTE — Progress Notes (Signed)
Subjective:    Patient ID: Christopher [name], male    DOB: 06-Jul-1965, 54 y.o.   MRN: [id number]  HPI  Pt is a 54 yr old male with hx of Multitrauma- causing L femoral neck fx, degloving of L hip to going/scrotum, bladder neck trauma- got SPC, developed compartment syndrome -s/p surgery for that; also diverting colostomy, skin grafts, and s/p trachand PEG- PEG is out. 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    Got frustrated yesterday and stood up at edge of bed- [place]- and went down.  Hurt R knee  Anxiety is really bad - freaking out after fall yesterday.  Bad for ~ 1 month.  On Clonazepam 1/2 tab (0.25 mg 3x/day scheduled)   Anger and anxiety - and blaming self for everything.  On Celexa- never tried Paxil  Just back in hospital for pelvic infection Went and cleaned out this time- didn't make as big an incision and didn't do skin flap.  Ortho feels confident "got it out" on IV ABX -2 ABXs (1 changed due to nausea).  Ultimate goal, can be controlled with ABX- goal to make pain more tolerable. Dr Jena GaussHaddix- Trauma ortho.    Wound VAC going on today for however long.     Pain Inventory Average Pain 10 Pain Right Now 10 My pain is sharp, dull, stabbing, tingling and aching  In the last 24 hours, has pain interfered with the following? General activity 10 Relation with others 10 Enjoyment of life 10 What TIME of day is your pain at its worst? all Sleep (in general) Fair  Pain is worse with: walking, bending, sitting, inactivity, standing and some activites Pain improves with: rest, heat/ice, pacing activities and medication Relief from Meds: 5  Mobility walk with assistance use a walker how many minutes can you walk? 5 ability to climb steps?  no do you drive?  no use a wheelchair needs help  with transfers  Function Do you have any goals in this area?  no  Neuro/Psych bladder control problems bowel control problems weakness numbness tremor tingling trouble walking spasms dizziness confusion depression anxiety  Prior Studies Any changes since last visit?  yes just out of hospital this week, had a wound vac but it was not working so has wet to dry dressing currently, picc line in right arm./   Fell yesterday and hurt his knee but did not seek treatment. It is reported swollen and hurts--has been applying ice.  Physicians involved in your care Matilde Spranglaire [place]    Family History  Problem Relation Age of Onset  . Breast cancer Mother        with mets to the bones   Social History   Socioeconomic History  . Marital status: Married    Spouse name: Not on file  . Number of children: Not on file  . Years of education: Not on file  . Highest education level: Not on file  Occupational History  . Not on file  Tobacco Use  . Smoking status: Former Smoker    Packs/day: 1.00    Years: 20.00    Pack years: 20.00    Types: Cigarettes    Quit date: 07/10/2018    Years since quitting: 1.2  . Smokeless tobacco: Never Used  Substance and Sexual Activity  . Alcohol use: Never  . Drug  use: Never  . Sexual activity: Yes  Other Topics Concern  . Not on file  Social History Narrative   ** Merged History Encounter **       Social Determinants of Health   Financial Resource Strain: Low Risk   . Difficulty of Paying Living Expenses: Not very hard  Food Insecurity: Food Insecurity Present  . Worried About Programme researcher, broadcasting/film/video in the Last Year: Sometimes true  . Ran Out of Food in the Last Year: Sometimes true  Transportation Needs: No Transportation Needs  . Lack of Transportation (Medical): No  . Lack of Transportation (Non-Medical): No  Physical Activity: Inactive  . Days of Exercise per Week: 0 days  . Minutes of Exercise per Session: 0 min  Stress: Stress  Concern Present  . Feeling of Stress : Rather much  Social Connections:   . Frequency of Communication with Friends and Family:   . Frequency of Social Gatherings with Friends and Family:   . Attends Religious Services:   . Active Member of Clubs or Organizations:   . Attends Banker Meetings:   Marland Kitchen Marital Status:    Past Surgical History:  Procedure Laterality Date  . APPLICATION OF A-CELL OF BACK N/A 08/06/2018   Procedure: Application Of A-Cell Of Back;  Surgeon: Peggye Form, DO;  [facility];  Service: Plastics;  Laterality: N/A;  . APPLICATION OF A-CELL OF EXTREMITY Left 08/06/2018   Procedure: Application Of A-Cell Of Extremity;  Surgeon: Peggye Form, DO;  [facility];  Service: Plastics;  Laterality: Left;  . APPLICATION OF A-CELL OF EXTREMITY Left 09/18/2019   Procedure: APPLICATION OF A-CELL OF EXTREMITY;  Surgeon: Peggye Form, DO;  [facility];  Service: Plastics;  Laterality: Left;  . APPLICATION OF WOUND VAC  07/12/2018   Procedure: Application Of Wound Vac to the Left Thigh and Scrotum.;  Surgeon: Roby Lofts, MD;  [facility];  Service: Orthopedics;;  . APPLICATION OF WOUND VAC  07/10/2018   Procedure: Application Of Wound Vac;  Surgeon: Berna Bue, MD;  [facility];  Service: General;;  . COLON SURGERY  2020   colostomy  . COLOSTOMY N/A 07/23/2018   Procedure: COLOSTOMY;  Surgeon: Violeta Gelinas, MD;  Location: [facility] OR;  Service: General;  Laterality: N/A;  . CYSTOSCOPY W/ URETERAL STENT PLACEMENT N/A 07/15/2018   Procedure: RETROGRADE URETHROGRAM;  Surgeon: Marcine Matar, MD;  Location: [facility] OR;  Service: Urology;  Laterality: N/A;  . CYSTOSCOPY WITH LITHOLAPAXY N/A 05/06/2019   Procedure: CYSTOSCOPY BASKET BLADDER STONE EXTRACTION;  Surgeon: Malen Gauze, MD;  Location: [facility];  Service: Urology;  Laterality: N/A;  30 MINS  . CYSTOSTOMY N/A 05/06/2019   Procedure: REPLACEMENT OF  SUPRAPUBIC CATHETER;  Surgeon: Malen Gauze, MD;  Location: [facility];  Service: Urology;  Laterality: N/A;  . DEBRIDEMENT AND CLOSURE WOUND Left 03/04/2019   Procedure: Excision of hip wound with placement of Acell;  Surgeon: Peggye Form, DO;  [facility];  Service: Plastics;  Laterality: Left;  . ESOPHAGOGASTRODUODENOSCOPY N/A 08/14/2018   Procedure: ESOPHAGOGASTRODUODENOSCOPY (EGD);  Surgeon: Violeta Gelinas, MD;  Location: [facility] ENDOSCOPY;  Service: General;  Laterality: N/A;  bedside  . FACIAL RECONSTRUCTION SURGERY     X 2--once as a teenager and second time in his 89's  . HARDWARE REMOVAL Left 03/04/2019   Procedure: Left Hip Hardware Removal;  Surgeon: Roby Lofts, MD;  [facility];  Service: Orthopedics;  Laterality: Left;  . HIP PINNING,CANNULATED Left 07/12/2018   Procedure: CANNULATED HIP PINNING;  Surgeon: Shona Needles, MD;  Location: [place];  Service: Orthopedics;  Laterality: Left;  . HIP SURGERY    . HOLMIUM LASER APPLICATION Right 02/13/8181   Procedure: HOLMIUM LASER APPLICATION;  Surgeon: Cleon Gustin, MD;  Location: [facility];  Service: Urology;  Laterality: Right;  . I & D EXTREMITY Left 07/25/2018   Procedure: Debridement of buttock, scrotum and left leg, placement of acell and vac;  Surgeon: [name], DO;  Location: [place];  Service: Plastics;  Laterality: Left;  . I & D EXTREMITY N/A 08/06/2018   Procedure: Debridement of buttock, scrotum and left leg;  Surgeon: [name], DO;  Location: [place];  Service: Plastics;  Laterality: N/A;  . I & D EXTREMITY N/A 08/13/2018   Procedure: Debridement of buttock, scrotum and left leg, placement of acell and vac;  Surgeon: [name], DO;  Location: [place];  Service: Plastics;  Laterality: N/A;  90 min, please  . INCISION AND DRAINAGE HIP Left 09/18/2019   Procedure: IRRIGATION AND DEBRIDEMENT HIP/ PELVIS WITH WOUND VAC PLACEMENT;  Surgeon: Shona Needles, MD;  Location: [place];  Service: Orthopedics;  Laterality: Left;  . INCISION AND DRAINAGE OF WOUND N/A 07/18/2018   Procedure: Debridement of left leg, buttocks and scrotal wound with placement of acell and Flexiseal;  Surgeon: [name], DO;  Location: [place];  Service: Plastics;  Laterality: N/A;  . INCISION AND DRAINAGE OF WOUND Left 08/29/2018   Procedure: Debridement of buttock, scrotum and left leg, placement of acell and vac;  Surgeon: [name], DO;  Location: [place];  Service: Plastics;  Laterality: Left;  75 min, please  . INCISION AND DRAINAGE OF WOUND Bilateral 10/23/2018   Procedure: DEBRIDEMENT OF BUTTOCK,SCROTUM, AND LEG WOUNDS WITH PLACEMENT OF ACELL- BILATERAL 90 MIN;  Surgeon: [name], DO;  Location: [place];  Service: Plastics;  Laterality: Bilateral;  . IR ANGIOGRAM PELVIS SELECTIVE OR SUPRASELECTIVE  07/10/2018  . IR ANGIOGRAM PELVIS SELECTIVE OR SUPRASELECTIVE  07/10/2018  . IR ANGIOGRAM SELECTIVE EACH ADDITIONAL VESSEL  07/10/2018  . IR EMBO ART  VEN HEMORR LYMPH EXTRAV  INC GUIDE ROADMAPPING  07/10/2018  . IR NEPHROSTOMY PLACEMENT LEFT  04/05/2019  . IR NEPHROSTOMY PLACEMENT RIGHT  05/31/2019  . IR US GUIDE BX ASP/DRAIN  07/10/2018  . IR US GUIDE VASC ACCESS RIGHT  07/10/2018  . IR VENO/EXT/UNI LEFT  07/10/2018  . IRRIGATION AND DEBRIDEMENT OF WOUND WITH SPLIT THICKNESS SKIN GRAFT Left 09/19/2018   Procedure: Debridement of gluteal wound with placement of acell to left leg.;  Surgeon: [name], DO;  Location: [place];  Service: Plastics;  Laterality: Left;  2.5 hours, please  . LAPAROTOMY N/A 07/12/2018   Procedure: EXPLORATORY LAPAROTOMY;  Surgeon: Georganna Skeans, MD;  Location: [place];  Service: General;  Laterality: N/A;  . LAPAROTOMY N/A 07/15/2018   Procedure: WOUND EXPLORATION; CLOSURE OF ABDOMEN;  Surgeon: Georganna Skeans, MD;  Location: [place];  Service: General;  Laterality: N/A;  . LAPAROTOMY  07/10/2018   Procedure: Exploratory  Laparotomy;  Surgeon: Clovis Riley, MD;  Location: [place];  Service: General;;  . MASS EXCISION Left 09/18/2019   Procedure: EXCISION UPPER LEFT INNER THIGH WOUND;  Surgeon: [name], DO;  Location: [place];  Service: Plastics;  Laterality: Left;  . NEPHROLITHOTOMY Right 07/15/2019  Procedure: NEPHROLITHOTOMY PERCUTANEOUS;  Surgeon: Malen Gauze, MD;  Location: [facility];  Service: Urology;  Laterality: Right;  90 MINS  . PEG PLACEMENT N/A 08/14/2018   Procedure: PERCUTANEOUS ENDOSCOPIC GASTROSTOMY (PEG) PLACEMENT;  Surgeon: Violeta Gelinas, MD;  Location: [facility] ENDOSCOPY;  Service: General;  Laterality: N/A;  . PERCUTANEOUS TRACHEOSTOMY N/A 08/02/2018   Procedure: PERCUTANEOUS TRACHEOSTOMY;  Surgeon: Violeta Gelinas, MD;  Location: [facility] OR;  Service: General;  Laterality: N/A;  . RADIOLOGY WITH ANESTHESIA N/A 07/10/2018   Procedure: IR WITH ANESTHESIA;  Surgeon: Simonne Come, MD;  Location[facility];  Service: Radiology;  Laterality: N/A;  . RADIOLOGY WITH ANESTHESIA Right 07/10/2018   Procedure: Ir With Anesthesia;  Surgeon: Simonne Come, MD;  Location: [facility] OR;  Service: Radiology;  Laterality: Right;  . SCROTAL EXPLORATION N/A 07/15/2018   Procedure: SCROTUM DEBRIDEMENT;  Surgeon: Marcine Matar, MD;  Location: [facility] OR;  Service: Urology;  Laterality: N/A;  . SHOULDER SURGERY    . SKIN SPLIT GRAFT Right 09/19/2018   Procedure: Skin Graft Split Thickness;  Surgeon: Peggye Form, DO;  [facility];  Service: Plastics;  Laterality: Right;  . SKIN SPLIT GRAFT N/A 10/03/2018   Procedure: Split thickness skin graft to gluteal area with acell placement;  Surgeon: Peggye Form, DO;  [facility];  Service: Plastics;  Laterality: N/A;  3 hours, please  . VACUUM ASSISTED CLOSURE CHANGE N/A 07/12/2018   Procedure: ABDOMINAL VACUUM ASSISTED CLOSURE CHANGE and abdominal washout;  Surgeon: Violeta Gelinas, MD;  Location: [facility] OR;  Service: General;  Laterality: N/A;  .  WOUND DEBRIDEMENT Left 07/23/2018   Procedure: DEBRIDEMENT LEFT BUTTOCK  WOUND;  Surgeon: Violeta Gelinas, MD;  Location: [facility] OR;  Service: General;  Laterality: Left;  . WOUND EXPLORATION Left 07/10/2018   Procedure: WOUND EXPLORATION LEFT GROIN;  Surgeon: Berna Bue, MD;  Location: [facility] OR;  Service: General;  Laterality: Left;   Past Medical History:  Diagnosis Date  . Acute on chronic respiratory failure with hypoxia (HCC) 06/2018   trach removed 11-16-2018, on vent from jan until may 2020  . Anxiety   . Bacteremia due to Pseudomonas 06/2018  . Chronic pain syndrome   . History of kidney stones   . Multiple traumatic injuries   . Wound discharge    left hip wound with bloody/clear drainage change dressing q day surgilube with gauze, between legs wound using calcium algenate pad bid   BP (!) 157/89   Pulse 73   Temp 98.6 F (37 C)   Ht 6\' 3"  (1.905 m) Comment: pt reported  Wt 152 lb (68.9 kg) Comment: pt reported  SpO2 97%   BMI 19.00 kg/m   Opioid Risk Score:   Fall Risk Score:  `1  Depression screen PHQ 2/9  Depression screen [facility] 2/9 04/10/2019 12/27/2018  Decreased Interest 0 2  Down, Depressed, Hopeless 0 0  PHQ - 2 Score 0 2  Altered sleeping - 1  Tired, decreased energy - 2  Change in appetite - 1  Feeling bad or failure about yourself  - 1  Trouble concentrating - 0  Moving slowly or fidgety/restless - 2  Suicidal thoughts - 0  PHQ-9 Score - 9  Some recent data might be hidden    Review of Systems  Constitutional: Negative.   HENT: Negative.   Eyes: Negative.   Respiratory: Negative.   Cardiovascular: Negative.   Gastrointestinal: Positive for nausea.       Bowel control  Endocrine: Negative.   Genitourinary:       Bladder control  Musculoskeletal: Positive for gait problem.       Spasms and knee pain  Skin: Positive for wound.  Allergic/Immunologic: Negative.   Neurological: Positive for dizziness, tremors and weakness.       Tingling    Hematological: Negative.   Psychiatric/Behavioral: Positive for confusion and dysphoric mood. The patient is nervous/anxious.   All other systems reviewed and are negative.      Objective:   Physical Exam Sitting up in power wheelchair, accompanied by wife, NAD R joystick (+)SPC R knee Swollen- and has large effusion A little warm to touch Very TTP - no bruising currently Has Baker's cyst notable from swelling Has gained a little weight- noticed in face, arms      Assessment & Plan:    Pt is a 54 yr old male with hx of Multitrauma- causing L femoral neck fx, degloving of L hip to going/scrotum, bladder neck trauma- got SPC, developed compartment syndrome -s/p surgery for that; also diverting colostomy, skin grafts, and s/p trachand PEG- PEG is out. 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    1. Clonazepam give 3x/day- suggest giving when anxious- better to stop than prevent anxiety.   2. Change Celexa to Paxil-  20 mg daily- can take at night if makes sleep. If helpful but wants more, can increase to 40 mg daily- however let me know so I can write Rx.   3. NEED A VOICE!  4. Becoming a survivor, not a victim. Think about a diary - and make another appointment with Dr Gibson Ramp.   5. R knee  Steroid injection- went over risk/s benefits- cleaned with betadine x3- and then with alcohol.  steroid injection was performed at R knee injection using 1% plain Lidocaine and 40mg  /1cc of Kenalog. This was well tolerated.  Cleaned with betadine x3 and allowed to dry- then alcohol then injected using 27 gauge 1.5 inch needle- no bleeding or complications.    F/U in 3 months for steroid injections of R knee.  Lidocaine will kick in 15 minutes- and wear off tonight- the steroid will kick in tomorrow within 24 hours and  take up to 72 hours to fully kick in.   6. Think about peer support.   7. The more you can tolerate ice, the better. Ice first 72-96 hours.  Don't put ice directly on skin.   8. F/U in 6 weeks- call if needed earlier.   I spent a total of 40 minutes on appointment- more than 25 minutes on discussion of anxiety/depression and 10 minutes with case mgr.

## 2019-09-25 NOTE — Telephone Encounter (Signed)
Mervin Kung, nurse with [facility], called because Mr. Spradley had an injection today and cannot tolerate having the new wound vac machine placed on. She wanted to know if it's okay to reapply the wet to dry dressing and place the wound vac in the morning when she comes back. Please call her [phone] to advise. She's with the patient for the next 10 minutes or so, but call her to let her know if that's okay.

## 2019-09-26 NOTE — Telephone Encounter (Signed)
Spoke with Mervin Kung today, she reports she was able to place the wound vac after patient had some time to relax. He was in a lot of distress and pain because he had fallen on her knee earlier in the day. She reports he was evaluated by a physician, but she is unsure who. He is schedules to come in to clinic tomorrow for Post op visit

## 2019-09-27 ENCOUNTER — Encounter: Payer: Self-pay | Admitting: Plastic Surgery

## 2019-09-27 ENCOUNTER — Ambulatory Visit (INDEPENDENT_AMBULATORY_CARE_PROVIDER_SITE_OTHER): Payer: No Typology Code available for payment source | Admitting: Plastic Surgery

## 2019-09-27 ENCOUNTER — Other Ambulatory Visit: Payer: Self-pay

## 2019-09-27 VITALS — BP 145/86 | HR 68 | Temp 97.9°F

## 2019-09-27 DIAGNOSIS — S31829D Unspecified open wound of left buttock, subsequent encounter: Secondary | ICD-10-CM

## 2019-09-27 DIAGNOSIS — S31000D Unspecified open wound of lower back and pelvis without penetration into retroperitoneum, subsequent encounter: Secondary | ICD-10-CM

## 2019-09-27 DIAGNOSIS — M86652 Other chronic osteomyelitis, left thigh: Secondary | ICD-10-CM

## 2019-09-27 NOTE — Progress Notes (Signed)
Subjective:     Patient ID: Christopher [name], male    DOB: 13-Oct-1965, 54 y.o.   MRN: [id number]  Chief Complaint  Patient presents with  . Post-op Follow-up    for removal of (L) hip hardward     HPI: The patient is a 54 y.o. male here for follow-up after irrigation and debridement of his left hip and pelvis with wound VAC placement by Dr. Doreatha Martin followed by application of ACell to left hip wound on 09/18/2019 by Dr. Marla Roe.  Patient reports that pain has been better since surgery.  Without pain meds he is chronically at a 9 out of 10.  With pain meds his pain is 5 out of 10.  This is tolerable for him.  He has not had any fevers, chills, nausea, vomiting.  He is receiving home health assistance with wound VAC changes 3 times a week.  Patient reports that he had fallen directly onto his right knee a few days ago.  He had seen rehabilitation physician for this and they did a lidocaine injection.  This provided some relief.  He continues to have some right knee swelling, no ecchymosis noted.  Patient's wife is here with him as well as their care coordinator.  Wife reported that they have been doing collagen dressing changes to his sacral wound that he developed while hospitalized.    Review of Systems  Constitutional: Positive for activity change. Negative for chills and fever.  Cardiovascular: Negative for leg swelling.  Musculoskeletal: Positive for arthralgias (Right knee, left hip).  Skin: Positive for color change and wound.     Objective:   Vital Signs BP (!) 145/86 (BP Location: Left Arm, Patient Position: Sitting, Cuff Size: Normal)   Pulse 68   Temp 97.9 F (36.6 C) (Oral)   SpO2 97%  Vital Signs and Nursing Note Reviewed Chaperone present Physical Exam  Constitutional: He is oriented to person, place, and time. No distress.    Thin white male, in power wheelchair  HENT:  Head: Normocephalic and atraumatic.  Pulmonary/Chest: Effort normal.  Abdominal:  Left  colostomy in place  Musculoskeletal:        General: Tenderness (Left hip) present.       Legs:  Neurological: He is alert and oriented to person, place, and time.  Skin: Skin is warm and dry. He is not diaphoretic.  Psychiatric: Mood and affect normal.      Assessment/Plan:     ICD-10-CM   1. Chronic osteomyelitis of pelvis, left ([place])  M86.652   2. Wound of left buttock, subsequent encounter  S31.829D   3. Sacral wound, subsequent encounter  S31.000D    Mr. Nehring is doing better today, he does report that he had a fall at home few days ago.  He reports left hip pain has been better since surgery.  There is no sign of any infection.  Recommend continuing with 3 times weekly wound VAC changes.  Home health has been very helpful with this. Sorbact left in place, home health can remove this in 1 week if patient and home health are comfortable with that.  Otherwise it can be left and we will remove it in 2 weeks.  Today I applied K-Y jelly, 4 x 4 gauze, ABD and tape in place of Medipore tape.  Home health schedule to place wound VAC tomorrow.  Recommend patient continue to monitor right knee pain, reach out to rehab physician if symptoms worsen or he needs further assistance.  He is always welcome to call us with any questions or concerns as well  Follow-up in 2 weeks for reevaluation.  Kermit Balo Mellina Benison, PA-C 09/27/2019, 12:16 PM

## 2019-09-28 ENCOUNTER — Ambulatory Visit: Payer: Self-pay | Attending: Internal Medicine

## 2019-09-28 DIAGNOSIS — Z23 Encounter for immunization: Secondary | ICD-10-CM

## 2019-09-28 NOTE — Progress Notes (Signed)
   Covid-19 Vaccination Clinic  Name:  Christopher [name]    MRN: [id number] DOB: [birth year]  09/28/2019  Mr. Christopher [name] was observed post Covid-19 immunization for 15 minutes without incident. He was provided with Vaccine Information Sheet and instruction to access the V-Safe system.   Mr. Christopher [name] was instructed to call 911 with any severe reactions post vaccine: Marland Kitchen Difficulty breathing  . Swelling of face and throat  . A fast heartbeat  . A bad rash all over body  . Dizziness and weakness   Immunizations Administered    Name Date Dose VIS Date Route   Pfizer COVID-19 Vaccine 09/28/2019  8:51 AM 0.3 mL 05/24/2019 Intramuscular   Manufacturer: [employer], [employer]   Lot: [id number]   NDC: [address]-1

## 2019-09-30 ENCOUNTER — Encounter: Payer: Self-pay | Admitting: Infectious Disease

## 2019-09-30 ENCOUNTER — Telehealth: Payer: Self-pay | Admitting: *Deleted

## 2019-09-30 NOTE — Telephone Encounter (Signed)
Christopher [name] called for OT visit 1wk1 for the week of 10/07/19.  Approval given.

## 2019-10-01 ENCOUNTER — Other Ambulatory Visit: Payer: Self-pay | Admitting: Physical Medicine and Rehabilitation

## 2019-10-01 ENCOUNTER — Telehealth: Payer: Self-pay

## 2019-10-01 NOTE — Telephone Encounter (Signed)
Received call today from Amy with [facility] regarding recent lab work. States patient's WBC was 6.5 Thursday and 15.6 yesterday. Will forward message to Md; labs will be faxed to office. Lorenso Courier, [place]

## 2019-10-01 NOTE — Telephone Encounter (Signed)
Dr. Orvan Falconer saw pt in the hospital and I am due to see in followup. If he is clinically stable would just recheck labs later this week

## 2019-10-01 NOTE — Telephone Encounter (Signed)
Patient's wife called office today with concerns regarding symptoms. States last Wednesday patient started experiencing the following symptoms; nausea/ vomiting, stomach pain, diarrhea, and loss of appetite. States symptoms are progressively getting worse.  Is still taking antibiotics as directed. Patient is staying hydrated and trying to eat as much as he can handel.Does have history of diarrhea when on antibiotics (per spouse) Would like to know how labs are looking. Would like recommendations on how to manage symptoms. Did receive covid vaccine Saturday; is not sure if symptoms could have caused this. Will forward message to Md to advise on concerns. Spouse would like a call back. Lorenso Courier, [place]

## 2019-10-02 ENCOUNTER — Telehealth: Payer: Self-pay | Admitting: Infectious Disease

## 2019-10-02 NOTE — Telephone Encounter (Signed)
Spoke with patient's spouse who states patient is feeling better. Nausea has improved; diarrhea has not changed since last call.  States she is not sure if she can give him antidiarrhea medicine due to current meds. Advised her to take husband to Urgent Care/ PCP for evaluation. Will try to get appointment scheduled.  Patient did have labs today. Lorenso Courier, [place]

## 2019-10-02 NOTE — Telephone Encounter (Signed)
He needs to see pCP vs urgent care with all of these symptoms. I  cannot assess them all over the phone

## 2019-10-02 NOTE — Telephone Encounter (Signed)
Please advise on patient's symptoms. Patient would like to know what he should do regarding nausea, diarrhea, stomach pain, and loss of appetite. Patient is taking prescribed nausea medication, but has not helped. Is staying hydrated.  Christopher [name], [place]

## 2019-10-02 NOTE — Telephone Encounter (Signed)
duplicate

## 2019-10-07 ENCOUNTER — Encounter: Payer: Self-pay | Admitting: Infectious Disease

## 2019-10-07 ENCOUNTER — Telehealth: Payer: Self-pay | Admitting: *Deleted

## 2019-10-07 NOTE — Telephone Encounter (Signed)
Peter from [facility] is calling about some orders they faxed over for Dr Berline Chough to sign on Mr Littler PT extension.  They faxed and have not received back.

## 2019-10-08 NOTE — Telephone Encounter (Signed)
All faxes that have been received have been placed in Dr Dahlia Client box, it could have been faxed over yesterday and sent to scan center.

## 2019-10-10 NOTE — Telephone Encounter (Signed)
Verbal ok given to [facility] for extension of PT.

## 2019-10-11 ENCOUNTER — Ambulatory Visit: Payer: Self-pay | Admitting: Surgical

## 2019-10-11 ENCOUNTER — Telehealth: Payer: Self-pay

## 2019-10-11 NOTE — Telephone Encounter (Signed)
Peter, PT/[facility] called for VO for HHPT 2wk4. Orders approved and given. Kayla, OT/[facility] called for VO for HHOT 1wk8. Orders approved and given.

## 2019-10-12 ENCOUNTER — Other Ambulatory Visit: Payer: Self-pay | Admitting: Physical Medicine and Rehabilitation

## 2019-10-15 ENCOUNTER — Encounter: Payer: Self-pay | Admitting: Surgical

## 2019-10-15 ENCOUNTER — Other Ambulatory Visit: Payer: Self-pay

## 2019-10-15 ENCOUNTER — Ambulatory Visit (INDEPENDENT_AMBULATORY_CARE_PROVIDER_SITE_OTHER): Payer: No Typology Code available for payment source | Admitting: Surgical

## 2019-10-15 VITALS — BP 132/83 | HR 82 | Temp 97.8°F

## 2019-10-15 DIAGNOSIS — S31809D Unspecified open wound of unspecified buttock, subsequent encounter: Secondary | ICD-10-CM

## 2019-10-15 DIAGNOSIS — M86652 Other chronic osteomyelitis, left thigh: Secondary | ICD-10-CM

## 2019-10-15 DIAGNOSIS — S31000D Unspecified open wound of lower back and pelvis without penetration into retroperitoneum, subsequent encounter: Secondary | ICD-10-CM

## 2019-10-15 DIAGNOSIS — S31829D Unspecified open wound of left buttock, subsequent encounter: Secondary | ICD-10-CM

## 2019-10-15 NOTE — Progress Notes (Signed)
Patient is a 54 year old male here for follow-up after irrigation debridement of left hip and pelvis with placement of ACell to left hip wound and wound VAC placement by Dr. Ulice Bold and Dr. Jena Gauss on 09/18/2019.  Patient has been receiving home health care with dressing changes 3 times per week.  They have been using white sponges to pack the wound to assist with granulation tissue.  He reports that today he has been having a lot of diarrhea which is causing burning of his stoma.  His right knee is continuing to hurt, but he had an x-ray which was negative.  He continues to receive vancomycin daily and cefepime 3 times a day.  He is currently being tested for C. difficile colitis.  Patient sacral wound has healed, his left hip wound is improving it is 2.2 cm in depth today and approximately 2 cm wide by 0.7 cm in height. There is no purulent drainage.  I was able to visualize the base of the wound bed which had good granulation tissue.  There is no foul odor.  Bone cement was visualized at the inferior aspect of the wound bed.  Donated ACell placed within left hip wound to help with granulation tissue.  The wound bed appears healthy and there is no sign of any acute infection.  Adaptic applied as well.  Recommend daily dressing changes by applying K-Y jelly to Adaptic.  Change Adaptic on Friday.  Restart wound VAC on Monday, 10/21/2019.  Continue with 3 times weekly dressing changes with wound VAC.  Call with any questions or concerns.  Follow-up in 2 weeks for reevaluation.  Pictures were obtained of the patient and placed in the chart with the patient's or guardian's permission.

## 2019-10-19 LAB — FUNGUS CULTURE WITH STAIN

## 2019-10-19 LAB — FUNGAL ORGANISM REFLEX

## 2019-10-19 LAB — FUNGUS CULTURE RESULT

## 2019-10-22 ENCOUNTER — Ambulatory Visit: Payer: Self-pay | Attending: Internal Medicine

## 2019-10-22 DIAGNOSIS — Z23 Encounter for immunization: Secondary | ICD-10-CM

## 2019-10-22 NOTE — Progress Notes (Signed)
   Covid-19 Vaccination Clinic  Name:  EARLEY GROBE    MRN: [id number] DOB: [birth year]  10/22/2019  Mr. Rominger was observed post Covid-19 immunization for 15 minutes without incident. He was provided with Vaccine Information Sheet and instruction to access the V-Safe system.   Mr. Mathey was instructed to call 911 with any severe reactions post vaccine: Marland Kitchen Difficulty breathing  . Swelling of face and throat  . A fast heartbeat  . A bad rash all over body  . Dizziness and weakness   Immunizations Administered    Name Date Dose VIS Date Route   Pfizer COVID-19 Vaccine 10/22/2019  9:59 AM 0.3 mL 08/07/2018 Intramuscular   Manufacturer: [employer], [employer]   Lot: [id number]   NDC: [address]-4

## 2019-10-23 ENCOUNTER — Telehealth: Payer: Self-pay | Admitting: *Deleted

## 2019-10-23 MED ORDER — OXYCODONE-ACETAMINOPHEN 5-325 MG PO TABS: 1.0000 | ORAL_TABLET | Freq: Four times a day (QID) | ORAL | 0 refills | Status: DC | PRN

## 2019-10-23 MED ORDER — FENTANYL 50 MCG/HR TD PT72: 1.0000 | MEDICATED_PATCH | TRANSDERMAL | 0 refills | Status: DC

## 2019-10-23 NOTE — Telephone Encounter (Signed)
Refilled Percocet 5/325 mg #120 and Duragesic 50 mcg #15

## 2019-10-23 NOTE — Telephone Encounter (Signed)
Yancy Knoble called for refills on Mr Posner's oxycodone 5/325 and his fentanyl patches. By PMP the oxycodone was filled  09/20/19 and his fentanyl was filled 09/23/19.

## 2019-10-24 NOTE — Telephone Encounter (Signed)
Notified Mrs Longenecker.

## 2019-10-29 ENCOUNTER — Other Ambulatory Visit: Payer: Self-pay

## 2019-10-29 ENCOUNTER — Ambulatory Visit (INDEPENDENT_AMBULATORY_CARE_PROVIDER_SITE_OTHER): Payer: No Typology Code available for payment source | Admitting: Surgical

## 2019-10-29 ENCOUNTER — Encounter: Payer: Self-pay | Admitting: Surgical

## 2019-10-29 VITALS — BP 152/95 | HR 79 | Temp 98.4°F | Ht 75.0 in | Wt 150.0 lb

## 2019-10-29 DIAGNOSIS — S31829D Unspecified open wound of left buttock, subsequent encounter: Secondary | ICD-10-CM

## 2019-10-29 DIAGNOSIS — Z933 Colostomy status: Secondary | ICD-10-CM

## 2019-10-29 DIAGNOSIS — F418 Other specified anxiety disorders: Secondary | ICD-10-CM

## 2019-10-29 DIAGNOSIS — S381XXD Crushing injury of abdomen, lower back, and pelvis, subsequent encounter: Secondary | ICD-10-CM

## 2019-10-29 DIAGNOSIS — Z466 Encounter for fitting and adjustment of urinary device: Secondary | ICD-10-CM

## 2019-10-29 DIAGNOSIS — M86659 Other chronic osteomyelitis, unspecified thigh: Secondary | ICD-10-CM

## 2019-10-29 DIAGNOSIS — N202 Calculus of kidney with calculus of ureter: Secondary | ICD-10-CM

## 2019-10-29 DIAGNOSIS — M86652 Other chronic osteomyelitis, left thigh: Secondary | ICD-10-CM

## 2019-10-29 DIAGNOSIS — Z792 Long term (current) use of antibiotics: Secondary | ICD-10-CM

## 2019-10-29 DIAGNOSIS — Z945 Skin transplant status: Secondary | ICD-10-CM

## 2019-10-29 DIAGNOSIS — G894 Chronic pain syndrome: Secondary | ICD-10-CM

## 2019-10-29 DIAGNOSIS — L89152 Pressure ulcer of sacral region, stage 2: Secondary | ICD-10-CM

## 2019-10-29 DIAGNOSIS — Z452 Encounter for adjustment and management of vascular access device: Secondary | ICD-10-CM

## 2019-10-29 DIAGNOSIS — S32811D Multiple fractures of pelvis with unstable disruption of pelvic ring, subsequent encounter for fracture with routine healing: Secondary | ICD-10-CM

## 2019-10-29 DIAGNOSIS — R1310 Dysphagia, unspecified: Secondary | ICD-10-CM

## 2019-10-29 DIAGNOSIS — Z5181 Encounter for therapeutic drug level monitoring: Secondary | ICD-10-CM

## 2019-10-29 DIAGNOSIS — S3802XD Crushing injury of scrotum and testis, subsequent encounter: Secondary | ICD-10-CM

## 2019-10-29 DIAGNOSIS — Z435 Encounter for attention to cystostomy: Secondary | ICD-10-CM

## 2019-10-29 DIAGNOSIS — E638 Other specified nutritional deficiencies: Secondary | ICD-10-CM

## 2019-10-29 DIAGNOSIS — J9611 Chronic respiratory failure with hypoxia: Secondary | ICD-10-CM

## 2019-10-29 DIAGNOSIS — Z4800 Encounter for change or removal of nonsurgical wound dressing: Secondary | ICD-10-CM

## 2019-10-29 NOTE — Progress Notes (Signed)
   Subjective:     Patient ID: Christopher [name], male    DOB: February 10, 1966, 54 y.o.   MRN: [id number]  Chief Complaint  Patient presents with  . Follow-up    HPI: The patient is a 54 y.o. male here for follow-up on his chronic left hip wound.  Patient reports that he has recently been diagnosed with C. difficile colitis, he has been receiving IV vancomycin along with other antibiotics due to chronic osteomyelitis of his left hip.  He just started on vancomycin p.o. today.   Patient and wife report that they feel as if wound VAC has been less helpful and daily dressing changes with Adaptic and 4 x 4 gauze.  He reports that his right knee is improving.  Review of Systems  Constitutional: Positive for activity change. Negative for chills and fever.  Gastrointestinal: Positive for abdominal pain and diarrhea.  Skin: Positive for wound.     Objective:   Vital Signs BP (!) 152/95 (BP Location: Left Arm, Patient Position: Sitting, Cuff Size: Normal)   Pulse 79   Temp 98.4 F (36.9 C) (Temporal)   Ht 6\' 3"  (1.905 m)   Wt 150 lb (68 kg)   SpO2 96%   BMI 18.75 kg/m  Vital Signs and Nursing Note Reviewed Chaperone present Physical Exam  Constitutional: He is oriented to person, place, and time.  Thin male, in electric wheelchair  Pulmonary/Chest: Effort normal.  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  Skin: Skin is warm and dry.     Left posterior hip wound, approximately 1.5 cm in depth, granulation tissue noted within wound bed, bone cement noted within wound bed.  No purulent drainage.  Serosanguineous drainage noted on exam.  Blistering superior to wound has resolved.  No periwound erythema or fluctuance noted.  Psychiatric: Mood and affect normal.      Assessment/Plan:     ICD-10-CM   1. Chronic osteomyelitis of pelvis, left (HCC)  M86.652   2. Wound of left buttock,  subsequent encounter  S31.829D     Donated ACell applied to left hip wound, recommend daily dressing changes with K-Y jelly followed by 4 x 4 gauze and tape.  Change Adaptic in 3 days then change every other day pending soil level.  Due to patient having difficulty with wound VAC and family feeling as if wound VAC has been less helpful plan regular dressing changes will take a VAC holiday to allow the periwound area to recover from wound VAC tape and to evaluate if dressing changes with Adaptic and K-Y jelly will be more beneficial.   There is no sign of infection. Follow-up in 2 weeks for reevaluation.  Call with questions or concerns.   Michaeline Eckersley, PA-C 10/29/2019, 3:22 PM

## 2019-10-30 ENCOUNTER — Inpatient Hospital Stay (HOSPITAL_COMMUNITY)
Admission: EM | Admit: 2019-10-30 | Discharge: 2019-11-01 | DRG: 371 | Disposition: A | Discharge status: Home Health | Payer: Self-pay | Attending: Infectious Disease | Admitting: Infectious Disease

## 2019-10-30 ENCOUNTER — Encounter: Payer: Self-pay | Admitting: Infectious Disease

## 2019-10-30 ENCOUNTER — Emergency Department (HOSPITAL_COMMUNITY): Payer: Self-pay

## 2019-10-30 ENCOUNTER — Encounter (HOSPITAL_COMMUNITY): Payer: Self-pay

## 2019-10-30 ENCOUNTER — Telehealth: Payer: Self-pay

## 2019-10-30 ENCOUNTER — Other Ambulatory Visit: Payer: Self-pay

## 2019-10-30 ENCOUNTER — Ambulatory Visit (INDEPENDENT_AMBULATORY_CARE_PROVIDER_SITE_OTHER): Payer: No Typology Code available for payment source | Admitting: Infectious Disease

## 2019-10-30 VITALS — BP 132/84 | HR 73 | Temp 98.1°F

## 2019-10-30 DIAGNOSIS — I2690 Septic pulmonary embolism without acute cor pulmonale: Secondary | ICD-10-CM

## 2019-10-30 DIAGNOSIS — F419 Anxiety disorder, unspecified: Secondary | ICD-10-CM

## 2019-10-30 DIAGNOSIS — J690 Pneumonitis due to inhalation of food and vomit: Secondary | ICD-10-CM | POA: Diagnosis present

## 2019-10-30 DIAGNOSIS — Z79891 Long term (current) use of opiate analgesic: Secondary | ICD-10-CM

## 2019-10-30 DIAGNOSIS — R079 Chest pain, unspecified: Secondary | ICD-10-CM

## 2019-10-30 DIAGNOSIS — K651 Peritoneal abscess: Principal | ICD-10-CM | POA: Diagnosis present

## 2019-10-30 DIAGNOSIS — G894 Chronic pain syndrome: Secondary | ICD-10-CM | POA: Diagnosis present

## 2019-10-30 DIAGNOSIS — E876 Hypokalemia: Secondary | ICD-10-CM | POA: Diagnosis present

## 2019-10-30 DIAGNOSIS — M86652 Other chronic osteomyelitis, left thigh: Secondary | ICD-10-CM | POA: Diagnosis not present

## 2019-10-30 DIAGNOSIS — M869 Osteomyelitis, unspecified: Secondary | ICD-10-CM

## 2019-10-30 DIAGNOSIS — Z803 Family history of malignant neoplasm of breast: Secondary | ICD-10-CM

## 2019-10-30 DIAGNOSIS — Z79899 Other long term (current) drug therapy: Secondary | ICD-10-CM

## 2019-10-30 DIAGNOSIS — A0472 Enterocolitis due to Clostridium difficile, not specified as recurrent: Secondary | ICD-10-CM | POA: Insufficient documentation

## 2019-10-30 DIAGNOSIS — I10 Essential (primary) hypertension: Secondary | ICD-10-CM | POA: Diagnosis present

## 2019-10-30 DIAGNOSIS — T829XXA Unspecified complication of cardiac and vascular prosthetic device, implant and graft, initial encounter: Secondary | ICD-10-CM

## 2019-10-30 DIAGNOSIS — F4310 Post-traumatic stress disorder, unspecified: Secondary | ICD-10-CM

## 2019-10-30 DIAGNOSIS — F32A Depression, unspecified: Secondary | ICD-10-CM

## 2019-10-30 DIAGNOSIS — R0682 Tachypnea, not elsewhere classified: Secondary | ICD-10-CM | POA: Diagnosis not present

## 2019-10-30 DIAGNOSIS — Z87442 Personal history of urinary calculi: Secondary | ICD-10-CM

## 2019-10-30 DIAGNOSIS — I2699 Other pulmonary embolism without acute cor pulmonale: Secondary | ICD-10-CM | POA: Insufficient documentation

## 2019-10-30 DIAGNOSIS — E43 Unspecified severe protein-calorie malnutrition: Secondary | ICD-10-CM | POA: Diagnosis present

## 2019-10-30 DIAGNOSIS — Z20822 Contact with and (suspected) exposure to covid-19: Secondary | ICD-10-CM | POA: Diagnosis present

## 2019-10-30 DIAGNOSIS — Z681 Body mass index (BMI) 19 or less, adult: Secondary | ICD-10-CM

## 2019-10-30 DIAGNOSIS — Z888 Allergy status to other drugs, medicaments and biological substances status: Secondary | ICD-10-CM

## 2019-10-30 DIAGNOSIS — I82621 Acute embolism and thrombosis of deep veins of right upper extremity: Secondary | ICD-10-CM | POA: Diagnosis present

## 2019-10-30 DIAGNOSIS — R69 Illness, unspecified: Secondary | ICD-10-CM

## 2019-10-30 DIAGNOSIS — I82409 Acute embolism and thrombosis of unspecified deep veins of unspecified lower extremity: Secondary | ICD-10-CM

## 2019-10-30 DIAGNOSIS — M86159 Other acute osteomyelitis, unspecified femur: Secondary | ICD-10-CM

## 2019-10-30 DIAGNOSIS — Z933 Colostomy status: Secondary | ICD-10-CM

## 2019-10-30 DIAGNOSIS — J9811 Atelectasis: Secondary | ICD-10-CM | POA: Diagnosis present

## 2019-10-30 DIAGNOSIS — J189 Pneumonia, unspecified organism: Secondary | ICD-10-CM

## 2019-10-30 HISTORY — DX: Enterocolitis due to Clostridium difficile, not specified as recurrent: A04.72

## 2019-10-30 LAB — LACTIC ACID, PLASMA
Lactic Acid, Venous: 1.2 mmol/L (ref 0.5–1.9)
Lactic Acid, Venous: 1 mmol/L (ref 0.5–1.9)

## 2019-10-30 LAB — URINALYSIS, ROUTINE W REFLEX MICROSCOPIC
Glucose, UA: NEGATIVE mg/dL
Ketones, ur: NEGATIVE mg/dL
Nitrite: NEGATIVE
Protein, ur: 100 mg/dL — AB
Specific Gravity, Urine: 1.025 (ref 1.005–1.030)
pH: 5 (ref 5.0–8.0)

## 2019-10-30 LAB — COMPREHENSIVE METABOLIC PANEL
ALT: 19 U/L (ref 0–44)
AST: 19 U/L (ref 15–41)
Albumin: 2.8 g/dL — ABNORMAL LOW (ref 3.5–5.0)
Alkaline Phosphatase: 120 U/L (ref 38–126)
Anion gap: 10 (ref 5–15)
BUN: 8 mg/dL (ref 6–20)
CO2: 28 mmol/L (ref 22–32)
Calcium: 9.3 mg/dL (ref 8.9–10.3)
Chloride: 103 mmol/L (ref 98–111)
Creatinine, Ser: 0.61 mg/dL (ref 0.61–1.24)
GFR calc Af Amer: >60 mL/min (ref >60)
GFR calc non Af Amer: >60 mL/min (ref >60)
Glucose, Bld: 100 mg/dL — ABNORMAL HIGH (ref 70–99)
Potassium: 3.9 mmol/L (ref 3.5–5.1)
Sodium: 141 mmol/L (ref 135–145)
Total Bilirubin: 0.2 mg/dL — ABNORMAL LOW (ref 0.3–1.2)
Total Protein: 6.5 g/dL (ref 6.5–8.1)

## 2019-10-30 LAB — I-STAT CHEM 8, ED
BUN: 10 mg/dL (ref 6–20)
Calcium, Ion: 1.28 mmol/L (ref 1.15–1.40)
Chloride: 102 mmol/L (ref 98–111)
Creatinine, Ser: 0.5 mg/dL — ABNORMAL LOW (ref 0.61–1.24)
Glucose, Bld: 94 mg/dL (ref 70–99)
HCT: 37 % — ABNORMAL LOW (ref 39.0–52.0)
Hemoglobin: 12.6 g/dL — ABNORMAL LOW (ref 13.0–17.0)
Potassium: 3.7 mmol/L (ref 3.5–5.1)
Sodium: 140 mmol/L (ref 135–145)
TCO2: 29 mmol/L (ref 22–32)

## 2019-10-30 LAB — SARS CORONAVIRUS 2 BY RT PCR (HOSPITAL ORDER, PERFORMED IN ~~LOC~~ HOSPITAL LAB): SARS Coronavirus 2: NEGATIVE

## 2019-10-30 LAB — CBC WITH DIFFERENTIAL/PLATELET
Abs Immature Granulocytes: 0.07 10*3/uL (ref 0.00–0.07)
Basophils Absolute: 0 10*3/uL (ref 0.0–0.1)
Basophils Relative: 0 %
Eosinophils Absolute: 0.1 10*3/uL (ref 0.0–0.5)
Eosinophils Relative: 1 %
HCT: 40 % (ref 39.0–52.0)
Hemoglobin: 12.6 g/dL — ABNORMAL LOW (ref 13.0–17.0)
Immature Granulocytes: 1 %
Lymphocytes Relative: 5 %
Lymphs Abs: 0.7 10*3/uL (ref 0.7–4.0)
MCH: 31.8 pg (ref 26.0–34.0)
MCHC: 31.5 g/dL (ref 30.0–36.0)
MCV: 101 fL — ABNORMAL HIGH (ref 80.0–100.0)
Monocytes Absolute: 0.7 10*3/uL (ref 0.1–1.0)
Monocytes Relative: 5 %
Neutro Abs: 13.4 10*3/uL — ABNORMAL HIGH (ref 1.7–7.7)
Neutrophils Relative %: 88 %
Platelets: 267 10*3/uL (ref 150–400)
RBC: 3.96 MIL/uL — ABNORMAL LOW (ref 4.22–5.81)
RDW: 12.5 % (ref 11.5–15.5)
WBC: 15 10*3/uL — ABNORMAL HIGH (ref 4.0–10.5)
nRBC: 0 % (ref 0.0–0.2)

## 2019-10-30 LAB — TROPONIN I (HIGH SENSITIVITY)
Troponin I (High Sensitivity): 3 ng/L (ref <18)
Troponin I (High Sensitivity): 3 ng/L (ref <18)

## 2019-10-30 LAB — PROTIME-INR
INR: 1 (ref 0.8–1.2)
Prothrombin Time: 12.5 seconds (ref 11.4–15.2)

## 2019-10-30 LAB — HIV ANTIBODY (ROUTINE TESTING W REFLEX): HIV Screen 4th Generation wRfx: NONREACTIVE

## 2019-10-30 MED ORDER — IOHEXOL 350 MG/ML SOLN
100.0000 mL | Freq: Once | INTRAVENOUS | Status: AC | PRN
  Administered 2019-10-30: 100 mL via INTRAVENOUS

## 2019-10-30 MED ORDER — METHOCARBAMOL 500 MG PO TABS
500.0000 mg | ORAL_TABLET | Freq: Four times a day (QID) | ORAL | Status: DC | PRN
  Administered 2019-10-30 – 2019-11-01 (×4): 500 mg via ORAL
  Filled 2019-10-30 (×4): qty 1

## 2019-10-30 MED ORDER — GABAPENTIN 300 MG PO CAPS
300.0000 mg | ORAL_CAPSULE | Freq: Three times a day (TID) | ORAL | Status: DC
  Administered 2019-10-30 – 2019-11-01 (×7): 300 mg via ORAL
  Filled 2019-10-30: qty 1
  Filled 2019-10-30: qty 3
  Filled 2019-10-30 (×5): qty 1

## 2019-10-30 MED ORDER — OXYCODONE-ACETAMINOPHEN 5-325 MG PO TABS: 1.0000 | ORAL_TABLET | ORAL | Status: DC | PRN

## 2019-10-30 MED ORDER — BENZONATATE 100 MG PO CAPS
200.0000 mg | ORAL_CAPSULE | Freq: Three times a day (TID) | ORAL | Status: DC
  Administered 2019-10-30 – 2019-11-01 (×7): 200 mg via ORAL
  Filled 2019-10-30 (×7): qty 2

## 2019-10-30 MED ORDER — ONDANSETRON HCL 4 MG PO TABS
4.0000 mg | ORAL_TABLET | Freq: Three times a day (TID) | ORAL | Status: DC | PRN
  Administered 2019-10-30: 8 mg via ORAL
  Filled 2019-10-30: qty 2

## 2019-10-30 MED ORDER — HYDROMORPHONE HCL 1 MG/ML IJ SOLN
1.0000 mg | INTRAMUSCULAR | Status: DC | PRN
  Administered 2019-10-30 – 2019-11-01 (×11): 1 mg via INTRAVENOUS
  Filled 2019-10-30 (×12): qty 1

## 2019-10-30 MED ORDER — SODIUM CHLORIDE 0.9 % IV SOLN
3.0000 g | Freq: Four times a day (QID) | INTRAVENOUS | Status: DC
  Administered 2019-10-30 – 2019-11-01 (×8): 3 g via INTRAVENOUS
  Filled 2019-10-30 (×4): qty 8
  Filled 2019-10-30 (×2): qty 3
  Filled 2019-10-30: qty 8
  Filled 2019-10-30: qty 3
  Filled 2019-10-30 (×4): qty 8

## 2019-10-30 MED ORDER — VANCOMYCIN HCL 250 MG PO CAPS: 500.0000 mg | ORAL_CAPSULE | Freq: Four times a day (QID) | ORAL | 0 refills | Status: DC

## 2019-10-30 MED ORDER — PAROXETINE HCL 20 MG PO TABS
20.0000 mg | ORAL_TABLET | Freq: Every day | ORAL | Status: DC
  Administered 2019-10-30 – 2019-11-01 (×3): 20 mg via ORAL
  Filled 2019-10-30 (×3): qty 1

## 2019-10-30 MED ORDER — MELATONIN 3 MG PO TABS
3.0000 mg | ORAL_TABLET | Freq: Every day | ORAL | Status: DC
  Administered 2019-10-30 – 2019-11-01 (×3): 3 mg via ORAL
  Filled 2019-10-30 (×4): qty 1

## 2019-10-30 MED ORDER — BACID PO TABS
2.0000 | ORAL_TABLET | Freq: Three times a day (TID) | ORAL | Status: DC
  Administered 2019-10-30 – 2019-11-01 (×8): 2 via ORAL
  Filled 2019-10-30 (×9): qty 2

## 2019-10-30 MED ORDER — HYDROMORPHONE HCL 1 MG/ML IJ SOLN
1.0000 mg | INTRAMUSCULAR | Status: DC | PRN
  Administered 2019-10-30 (×2): 1 mg via INTRAVENOUS
  Filled 2019-10-30 (×2): qty 1

## 2019-10-30 MED ORDER — GUAIFENESIN ER 600 MG PO TB12
1200.0000 mg | ORAL_TABLET | Freq: Two times a day (BID) | ORAL | Status: DC
  Administered 2019-10-30 – 2019-11-01 (×5): 1200 mg via ORAL
  Filled 2019-10-30 (×5): qty 2

## 2019-10-30 MED ORDER — ALBUTEROL SULFATE (2.5 MG/3ML) 0.083% IN NEBU: 2.5000 mg | INHALATION_SOLUTION | Freq: Four times a day (QID) | RESPIRATORY_TRACT | Status: DC | PRN

## 2019-10-30 MED ORDER — ADULT MULTIVITAMIN W/MINERALS CH
1.0000 | ORAL_TABLET | Freq: Every day | ORAL | Status: DC
  Administered 2019-10-30 – 2019-11-01 (×3): 1 via ORAL
  Filled 2019-10-30 (×3): qty 1

## 2019-10-30 MED ORDER — VANCOMYCIN 50 MG/ML ORAL SOLUTION
500.0000 mg | Freq: Four times a day (QID) | ORAL | Status: DC
  Administered 2019-10-30 – 2019-11-01 (×9): 500 mg via ORAL
  Filled 2019-10-30 (×12): qty 10

## 2019-10-30 MED ORDER — QUETIAPINE FUMARATE 25 MG PO TABS
50.0000 mg | ORAL_TABLET | Freq: Every day | ORAL | Status: DC
  Administered 2019-10-30 – 2019-11-01 (×3): 50 mg via ORAL
  Filled 2019-10-30 (×2): qty 2
  Filled 2019-10-30: qty 1

## 2019-10-30 MED ORDER — OXYCODONE-ACETAMINOPHEN 5-325 MG PO TABS
1.0000 | ORAL_TABLET | ORAL | Status: DC | PRN
  Administered 2019-10-31 – 2019-11-01 (×5): 1 via ORAL
  Filled 2019-10-30 (×5): qty 1

## 2019-10-30 MED ORDER — PRO-STAT SUGAR FREE PO LIQD
30.0000 mL | Freq: Three times a day (TID) | ORAL | Status: DC
  Administered 2019-10-31 – 2019-11-01 (×6): 30 mL via ORAL
  Filled 2019-10-30 (×7): qty 30

## 2019-10-30 MED ORDER — CLONAZEPAM 0.25 MG PO TBDP
0.2500 mg | ORAL_TABLET | Freq: Three times a day (TID) | ORAL | Status: DC | PRN
  Administered 2019-10-30 – 2019-10-31 (×2): 0.25 mg via ORAL
  Filled 2019-10-30 (×2): qty 1

## 2019-10-30 MED ORDER — FENTANYL 50 MCG/HR TD PT72: 1.0000 | MEDICATED_PATCH | TRANSDERMAL | Status: DC

## 2019-10-30 MED ORDER — FENTANYL 50 MCG/HR TD PT72
1.0000 | MEDICATED_PATCH | TRANSDERMAL | Status: DC
  Administered 2019-10-30: 1 via TRANSDERMAL
  Filled 2019-10-30: qty 1

## 2019-10-30 MED ORDER — ACETAMINOPHEN 325 MG PO TABS: 650.0000 mg | ORAL_TABLET | Freq: Four times a day (QID) | ORAL | Status: DC | PRN

## 2019-10-30 MED ORDER — DRONABINOL 2.5 MG PO CAPS
2.5000 mg | ORAL_CAPSULE | Freq: Two times a day (BID) | ORAL | Status: DC
  Administered 2019-10-31 – 2019-11-01 (×3): 2.5 mg via ORAL
  Filled 2019-10-30 (×3): qty 1

## 2019-10-30 MED ORDER — ENOXAPARIN SODIUM 40 MG/0.4ML ~~LOC~~ SOLN
40.0000 mg | SUBCUTANEOUS | Status: DC
  Administered 2019-10-30 – 2019-10-31 (×2): 40 mg via SUBCUTANEOUS
  Filled 2019-10-30 (×2): qty 0.4

## 2019-10-30 NOTE — ED Notes (Signed)
Pharmacy consulted w regard to fentanyl patch, will look into applying every 2 days vs every 3 days as ordered. Pt states pain level at 7/10 currently. No other needs expressed.

## 2019-10-30 NOTE — ED Notes (Signed)
Ordered new colostomy bag for pt.

## 2019-10-30 NOTE — H&P (Signed)
History and Physical    Christopher [name] ZOX:[id number] DOB: 1965/09/18 DOA: 10/30/2019  PCP: Ardith Dark, MD   Patient coming from: Home    Chief Complaint: SOB, abd pain  HPI: Christopher [name] is a 54 y.o. male with medical history significant of chronic left-sided pelvic osteomyelitis, recent C. difficile colitis diagnosed 3 days ago, anxiety, chronic colostomy presented with sudden onset of short of breath, sweating.  Patient has been on chronic IV antibiotics for a chronic left pelvic osteomyelitis, he has been on vancomycin and cefepime via a right arm PICC for 2 months which was discontinued earlier this week.  He continues to have diarrhea for several weeks, and was tested positive for C. difficile on this Monday.  He has been following with Dr. Daiva Eves for his osteomyelitis, who started him on p.o. vancomycin yesterday.  Today patient went to Dr. Lattie Haw office to have PICC line removed, soon after the PICC line was removed, patient suddenly started to feel extremely short of breath.  Dr. [name] suspect patient had a right arm DVT which dislodged because acute PE and sent patient to the ED.  Patient and his wife reported that patient has had " a lot of secretion and sinus congestion lately, which very hard to clear" he denied any fever chills, although he did have frequent cough with clear phlegm.  He denied any chest pain, no dysuria.  Patient reported has frequent abdominal cramps, which significantly decreased his oral intake since last week. ED Course: CTA negative for PE, but infiltrates of the right lower lobe suspecting obstructive pneumonia, WBC C 15  Review of Systems: As per HPI otherwise 10 point review of systems negative.    Past Medical History:  Diagnosis Date   Acute on chronic respiratory failure with hypoxia (HCC) 06/2018   trach removed 11-16-2018, on vent from jan until may 2020   Anxiety    Bacteremia due to Pseudomonas 06/2018   Chronic pain syndrome     Clostridium difficile colitis 10/30/2019   History of kidney stones    Multiple traumatic injuries    Wound discharge    left hip wound with bloody/clear drainage change dressing q day surgilube with gauze, between legs wound using calcium algenate pad bid    Past Surgical History:  Procedure Laterality Date   APPLICATION OF A-CELL OF BACK N/A 08/06/2018   Procedure: Application Of A-Cell Of Back;  Surgeon: Peggye Form, DO;  [facility]: [facility];  Service: Plastics;  Laterality: N/A;   APPLICATION OF A-CELL OF EXTREMITY Left 08/06/2018   Procedure: Application Of A-Cell Of Extremity;  Surgeon: Peggye Form, DO;  [facility]: [facility];  Service: Plastics;  Laterality: Left;   APPLICATION OF A-CELL OF EXTREMITY Left 09/18/2019   Procedure: APPLICATION OF A-CELL OF EXTREMITY;  Surgeon: Peggye Form, DO;  [facility]: [facility];  Service: Plastics;  Laterality: Left;   APPLICATION OF WOUND VAC  07/12/2018   Procedure: Application Of Wound Vac to the Left Thigh and Scrotum.;  Surgeon: Roby Lofts, MD;  Location: [facility];  Service: Orthopedics;;   APPLICATION OF WOUND VAC  07/10/2018   Procedure: Application Of Wound Vac;  Surgeon: Berna Bue, MD;  Location: [facility] OR;  Service: General;;   COLON SURGERY  2020   colostomy   COLOSTOMY N/A 07/23/2018   Procedure: COLOSTOMY;  Surgeon: Violeta Gelinas, MD;  Location: [facility] OR;  Service: General;  Laterality: N/A;   CYSTOSCOPY W/ URETERAL STENT PLACEMENT N/A  07/15/2018   Procedure: RETROGRADE URETHROGRAM;  Surgeon: Marcine Matar, MD;  Location: [facility] OR;  Service: Urology;  Laterality: N/A;   CYSTOSCOPY WITH LITHOLAPAXY N/A 05/06/2019   Procedure: CYSTOSCOPY BASKET BLADDER STONE EXTRACTION;  Surgeon: Malen Gauze, MD;  Location: [facility];  Service: Urology;  Laterality: N/A;  30 MINS   CYSTOSTOMY N/A 05/06/2019   Procedure: REPLACEMENT OF SUPRAPUBIC CATHETER;  Surgeon: Malen Gauze, MD;  Location:  [facility];  Service: Urology;  Laterality: N/A;   DEBRIDEMENT AND CLOSURE WOUND Left 03/04/2019   Procedure: Excision of hip wound with placement of Acell;  Surgeon: Peggye Form, DO;  [facility];  Service: Plastics;  Laterality: Left;   ESOPHAGOGASTRODUODENOSCOPY N/A 08/14/2018   Procedure: ESOPHAGOGASTRODUODENOSCOPY (EGD);  Surgeon: Violeta Gelinas, MD;  Location: [facility] ENDOSCOPY;  Service: General;  Laterality: N/A;  bedside   FACIAL RECONSTRUCTION SURGERY     X 2--once as a teenager and second time in his 80's   HARDWARE REMOVAL Left 03/04/2019   Procedure: Left Hip Hardware Removal;  Surgeon: Roby Lofts, MD;  [facility];  Service: Orthopedics;  Laterality: Left;   HIP PINNING,CANNULATED Left 07/12/2018   Procedure: CANNULATED HIP PINNING;  Surgeon: Roby Lofts, MD;  [facility];  Service: Orthopedics;  Laterality: Left;   HIP SURGERY     HOLMIUM LASER APPLICATION Right 07/15/2019   Procedure: HOLMIUM LASER APPLICATION;  Surgeon: Malen Gauze, MD;  Location: [facility];  Service: Urology;  Laterality: Right;   I & D EXTREMITY Left 07/25/2018   Procedure: Debridement of buttock, scrotum and left leg, placement of acell and vac;  Surgeon: Peggye Form, DO;  [facility];  Service: Plastics;  Laterality: Left;   I & D EXTREMITY N/A 08/06/2018   Procedure: Debridement of buttock, scrotum and left leg;  Surgeon: Peggye Form, DO;  [facility];  Service: Plastics;  Laterality: N/A;   I & D EXTREMITY N/A 08/13/2018   Procedure: Debridement of buttock, scrotum and left leg, placement of acell and vac;  Surgeon: Peggye Form, DO;  [facility];  Service: Plastics;  Laterality: N/A;  90 min, please   INCISION AND DRAINAGE HIP Left 09/18/2019   Procedure: IRRIGATION AND DEBRIDEMENT HIP/ PELVIS WITH WOUND VAC PLACEMENT;  Surgeon: Roby Lofts, MD;  [facility];  Service: Orthopedics;  Laterality:  Left;   INCISION AND DRAINAGE OF WOUND N/A 07/18/2018   Procedure: Debridement of left leg, buttocks and scrotal wound with placement of acell and Flexiseal;  Surgeon: Peggye Form, DO;  [facility];  Service: Plastics;  Laterality: N/A;   INCISION AND DRAINAGE OF WOUND Left 08/29/2018   Procedure: Debridement of buttock, scrotum and left leg, placement of acell and vac;  Surgeon: Peggye Form, DO;  [facility]: [facility];  Service: Plastics;  Laterality: Left;  75 min, please   INCISION AND DRAINAGE OF WOUND Bilateral 10/23/2018   Procedure: DEBRIDEMENT OF BUTTOCK,SCROTUM, AND LEG WOUNDS WITH PLACEMENT OF ACELL- BILATERAL 90 MIN;  Surgeon: Peggye Form, DO;  [facility];  Service: Plastics;  Laterality: Bilateral;   IR ANGIOGRAM PELVIS SELECTIVE OR SUPRASELECTIVE  07/10/2018   IR ANGIOGRAM PELVIS SELECTIVE OR SUPRASELECTIVE  07/10/2018   IR ANGIOGRAM SELECTIVE EACH ADDITIONAL VESSEL  07/10/2018   IR EMBO ART  VEN HEMORR LYMPH EXTRAV  INC GUIDE ROADMAPPING  07/10/2018   IR NEPHROSTOMY PLACEMENT LEFT  04/05/2019  IR NEPHROSTOMY PLACEMENT RIGHT  05/31/2019   IR US GUIDE BX ASP/DRAIN  07/10/2018   IR US GUIDE VASC ACCESS RIGHT  07/10/2018   IR VENO/EXT/UNI LEFT  07/10/2018   IRRIGATION AND DEBRIDEMENT OF WOUND WITH SPLIT THICKNESS SKIN GRAFT Left 09/19/2018   Procedure: Debridement of gluteal wound with placement of acell to left leg.;  Surgeon: Peggye Formillingham, Claire S, DO;  [facility];  Service: Plastics;  Laterality: Left;  2.5 hours, please   LAPAROTOMY N/A 07/12/2018   Procedure: EXPLORATORY LAPAROTOMY;  Surgeon: Violeta Gelinashompson, Burke, MD;  [facility];  Service: General;  Laterality: N/A;   LAPAROTOMY N/A 07/15/2018   Procedure: WOUND EXPLORATION; CLOSURE OF ABDOMEN;  Surgeon: Violeta Gelinashompson, Burke, MD;  [facility];  Service: General;  Laterality: N/A;   LAPAROTOMY  07/10/2018   Procedure: Exploratory Laparotomy;  Surgeon: Berna Bueonnor, Chelsea A, MD;  [facility];   Service: General;;   MASS EXCISION Left 09/18/2019   Procedure: EXCISION UPPER LEFT INNER THIGH WOUND;  Surgeon: Peggye Formillingham, Claire S, DO;  [facility];  Service: Plastics;  Laterality: Left;   NEPHROLITHOTOMY Right 07/15/2019   Procedure: NEPHROLITHOTOMY PERCUTANEOUS;  Surgeon: Malen GauzeMcKenzie, Patrick L, MD;  [facility];  Service: Urology;  Laterality: Right;  90 MINS   PEG PLACEMENT N/A 08/14/2018   Procedure: PERCUTANEOUS ENDOSCOPIC GASTROSTOMY (PEG) PLACEMENT;  Surgeon: Violeta Gelinashompson, Burke, MD;  [facility];  Service: General;  Laterality: N/A;   PERCUTANEOUS TRACHEOSTOMY N/A 08/02/2018   Procedure: PERCUTANEOUS TRACHEOSTOMY;  Surgeon: Violeta Gelinashompson, Burke, MD;  [facility];  Service: General;  Laterality: N/A;   RADIOLOGY WITH ANESTHESIA N/A 07/10/2018   Procedure: IR WITH ANESTHESIA;  Surgeon: Simonne ComeWatts, John, MD;  [facility];  Service: Radiology;  Laterality: N/A;   RADIOLOGY WITH ANESTHESIA Right 07/10/2018   Procedure: Ir With Anesthesia;  Surgeon: Simonne ComeWatts, John, MD;  [facility];  Service: Radiology;  Laterality: Right;   SCROTAL EXPLORATION N/A 07/15/2018   Procedure: SCROTUM DEBRIDEMENT;  Surgeon: Marcine Matarahlstedt, Stephen, MD;  [facility];  Service: Urology;  Laterality: N/A;   SHOULDER SURGERY     SKIN SPLIT GRAFT Right 09/19/2018   Procedure: Skin Graft Split Thickness;  Surgeon: Peggye Formillingham, Claire S, DO;  [facility];  Service: Plastics;  Laterality: Right;   SKIN SPLIT GRAFT N/A 10/03/2018   Procedure: Split thickness skin graft to gluteal area with acell placement;  Surgeon: Peggye Formillingham, Claire S, DO;  [facility];  Service: Plastics;  Laterality: N/A;  3 hours, please   VACUUM ASSISTED CLOSURE CHANGE N/A 07/12/2018   Procedure: ABDOMINAL VACUUM ASSISTED CLOSURE CHANGE and abdominal washout;  Surgeon: Violeta Gelinashompson, Burke, MD;  [facility];  Service: General;  Laterality: N/A;   WOUND DEBRIDEMENT Left 07/23/2018   Procedure: DEBRIDEMENT LEFT  BUTTOCK  WOUND;  Surgeon: Violeta Gelinashompson, Burke, MD;  [facility];  Service: General;  Laterality: Left;   WOUND EXPLORATION Left 07/10/2018   Procedure: WOUND EXPLORATION LEFT GROIN;  Surgeon: Berna Bueonnor, Chelsea A, MD;  [facility];  Service: General;  Laterality: Left;     reports that he quit smoking about 15 months ago. His smoking use included cigarettes. He has a 20.00 pack-year smoking history. He has never used smokeless tobacco. He reports that he does not drink alcohol or use drugs.  Allergies  Allergen Reactions   Omnicef [Cefdinir] Nausea And Vomiting    Family History  Problem Relation Age of Onset   Breast cancer Mother  with mets to the bones     Prior to Admission medications   Medication Sig Start Date End Date Taking? Authorizing Provider  acetaminophen (TYLENOL) 325 MG tablet Take 2 tablets (650 mg total) by mouth every 6 (six) hours as needed for mild pain (or Fever >/= 101). 03/12/19  Yes Buriev, Isaiah Serge, MD  albuterol (VENTOLIN HFA) 108 (90 Base) MCG/ACT inhaler TAKE 2 PUFFS BY MOUTH EVERY 6 HOURS AS NEEDED FOR WHEEZE OR SHORTNESS OF BREATH Patient taking differently: Inhale 2 puffs into the lungs every 6 (six) hours as needed for wheezing or shortness of breath.  09/16/19  Yes Ardith Dark, MD  Amino Acids-Protein Hydrolys (FEEDING SUPPLEMENT, PRO-STAT SUGAR FREE 64,) LIQD Take 30 mLs by mouth 3 (three) times daily with meals. 03/26/19  Yes Ardith Dark, MD  clonazePAM (KLONOPIN) 0.5 MG tablet Take 0.5 tablets (0.25 mg total) by mouth 3 (three) times daily as needed for anxiety. 09/25/19  Yes Lovorn, Megan, MD  [employer] MELATONIN 3 MG TABS tablet TAKE 1 TABLET BY MOUTH AT BEDTIME *WAITING ON PA Patient taking differently: Take 3 mg by mouth at bedtime.  10/14/19  Yes Lovorn, Aundra Millet, MD  dronabinol (MARINOL) 2.5 MG capsule TAKE 1 CAPSULE (2.5 MG TOTAL) BY MOUTH 2 (TWO) TIMES DAILY BEFORE A MEAL. 07/29/19  Yes Lovorn, Aundra Millet, MD  fentaNYL (DURAGESIC) 50 MCG/HR Place  1 patch onto the skin every other day. 10/23/19 10/22/20 Yes Lovorn, Aundra Millet, MD  gabapentin (NEURONTIN) 300 MG capsule TAKE 1 CAPSULE BY MOUTH 3 TIMES A DAY Patient taking differently: Take 300 mg by mouth 3 (three) times daily.  10/01/19  Yes Lovorn, Aundra Millet, MD  methocarbamol (ROBAXIN) 500 MG tablet TAKE 1 TABLET BY MOUTH EVERY 6 HOURS AS NEEDED FOR MUSCLE SPASMS Patient taking differently: Take 500 mg by mouth every 6 (six) hours as needed for muscle spasms.  06/27/19  Yes Lovorn, Aundra Millet, MD  Multiple Vitamin (MULTIVITAMIN WITH MINERALS) TABS tablet Take 1 tablet by mouth daily. 03/13/19  Yes Buriev, Isaiah Serge, MD  ondansetron (ZOFRAN) 4 MG tablet Take 1-2 tablets (4-8 mg total) by mouth every 8 (eight) hours as needed for nausea, vomiting or refractory nausea / vomiting (max dose in a day is 24 mg total). 05/03/19 05/02/20 Yes Lovorn, Aundra Millet, MD  oxyCODONE-acetaminophen (PERCOCET/ROXICET) 5-325 MG tablet Take 1 tablet by mouth every 6 (six) hours as needed for severe pain. Patient taking differently: Take 1 tablet by mouth every 4 (four) hours as needed for severe pain.  10/23/19  Yes Lovorn, Aundra Millet, MD  PARoxetine (PAXIL) 20 MG tablet Take 1 tablet (20 mg total) by mouth at bedtime. 09/25/19  Yes Lovorn, Aundra Millet, MD  QUEtiapine (SEROQUEL) 50 MG tablet Take 50 mg by mouth at bedtime.  05/11/19  Yes [provider]  vancomycin (VANCOCIN) 250 MG capsule Take 2 capsules (500 mg total) by mouth 4 (four) times daily for 14 days. If you have 500mg  capsules substitute these 10/30/19 11/13/19 Yes 01/13/20, Daiva Eves, MD  citalopram (CELEXA) 20 MG tablet TAKE 1 TABLET (20 MG TOTAL) BY MOUTH DAILY. FOR MOOD/ANXIETY (PA PENDING) Patient not taking: No sig reported 08/27/19   Lovorn, 08/29/19, MD  lidocaine (LIDODERM) 5 % Place 3 patches onto the skin every 12 (twelve) hours. Remove & Discard patch within 12 hours or as directed by MD Patient not taking: Reported on 10/30/2019 05/03/19 05/02/20  05/04/20, MD    prochlorperazine (COMPAZINE) 5 MG tablet Take 1-2 tablets (5-10 mg total) by mouth  every 6 (six) hours as needed for nausea. Patient not taking: Reported on 10/30/2019 12/12/18   Jerene Pitch    Physical Exam: Vitals:   10/30/19 1715 10/30/19 1730 10/30/19 1745 10/30/19 1800  BP: (!) 170/88 (!) 145/87 (!) 147/94 (!) 144/92  Pulse: 94 85 95 95  Resp: Temp:      TempSrc:      SpO2: 97% 94%  93%  Weight:      Height:        Constitutional: NAD, calm, comfortable Vitals:   10/30/19 1715 10/30/19 1730 10/30/19 1745 10/30/19 1800  BP: (!) 170/88 (!) 145/87 (!) 147/94 (!) 144/92  Pulse: 94 85 95 95  Resp: Temp:      TempSrc:      SpO2: 97% 94%  93%  Weight:      Height:       Eyes: PERRL, lids and conjunctivae normal ENMT: Mucous membranes are moist. Posterior pharynx clear of any exudate or lesions.Normal dentition.  Neck: normal, supple, no masses, no thyromegaly Respiratory: clear to auscultation bilaterally, no wheezing, no crackles. Normal respiratory effort. No accessory muscle use.  Cardiovascular: Regular rate and rhythm, no murmurs / rubs / gallops. No extremity edema. 2+ pedal pulses. No carotid bruits.  Abdomen: Mild tenderness on deep palpation in periumbilical area no rebound no guarding, no masses palpated. No hepatosplenomegaly. Bowel sounds positive.  Colostomy bag in position Musculoskeletal: no clubbing / cyanosis. No joint deformity upper and lower extremities. Good ROM, no contractures. Normal muscle tone.  Skin: no rashes, lesions, ulcers. No induration Neurologic: CN 2-12 grossly intact. Sensation intact, DTR normal. Strength 5/5 in all 4.  Psychiatric: Normal judgment and insight. Alert and oriented x 3. Normal mood.     Labs on Admission: I have personally reviewed following labs and imaging studies  CBC: Recent Labs  Lab 10/30/19 1356 10/30/19 1417  WBC 15.0*  --   NEUTROABS 13.4*  --   HGB 12.6* 12.6*  HCT 40.0  37.0*  MCV 101.0*  --   PLT 267  --    Basic Metabolic Panel: Recent Labs  Lab 10/30/19 1356 10/30/19 1417  NA 141 140  K 3.9 3.7  CL 103 102  CO2 28  --   GLUCOSE 100* 94  BUN 8 10  CREATININE 0.61 0.50*  CALCIUM 9.3  --    GFR: Estimated Creatinine Clearance: 99.4 mL/min (A) (by C-G formula based on SCr of 0.5 mg/dL (L)). Liver Function Tests: Recent Labs  Lab 10/30/19 1356  AST 19  ALT 19  ALKPHOS 120  BILITOT 0.2*  PROT 6.5  ALBUMIN 2.8*   No results for input(s): LIPASE, AMYLASE in the last 168 hours. No results for input(s): AMMONIA in the last 168 hours. Coagulation Profile: Recent Labs  Lab 10/30/19 1356  INR 1.0   Cardiac Enzymes: No results for input(s): CKTOTAL, CKMB, CKMBINDEX, TROPONINI in the last 168 hours. BNP (last 3 results) No results for input(s): PROBNP in the last 8760 hours. HbA1C: No results for input(s): HGBA1C in the last 72 hours. CBG: No results for input(s): GLUCAP in the last 168 hours. Lipid Profile: No results for input(s): CHOL, HDL, LDLCALC, TRIG, CHOLHDL, LDLDIRECT in the last 72 hours. Thyroid Function Tests: No results for input(s): TSH, T4TOTAL, FREET4, T3FREE, THYROIDAB in the last 72 hours. Anemia Panel: No results for input(s): VITAMINB12, FOLATE, FERRITIN, TIBC, IRON, RETICCTPCT in the last 72 hours. Urine  analysis:    Component Value Date/Time   COLORURINE YELLOW 10/30/2019 1428   APPEARANCEUR HAZY (A) 10/30/2019 1428   LABSPEC 1.025 10/30/2019 1428   PHURINE 5.0 10/30/2019 1428   GLUCOSEU NEGATIVE 10/30/2019 1428   HGBUR MODERATE (A) 10/30/2019 1428   BILIRUBINUR SMALL (A) 10/30/2019 1428   KETONESUR NEGATIVE 10/30/2019 1428   PROTEINUR 100 (A) 10/30/2019 1428   NITRITE NEGATIVE 10/30/2019 1428   LEUKOCYTESUR MODERATE (A) 10/30/2019 1428    Radiological Exams on Admission: CT Angio Chest PE W/Cm &/Or Wo Cm  Result Date: 10/30/2019 CLINICAL DATA:  Shortness of breath. Acute onset of dyspnea after having  a PICC removed from right arm. EXAM: CT ANGIOGRAPHY CHEST WITH CONTRAST TECHNIQUE: Multidetector CT imaging of the chest was performed using the standard protocol during bolus administration of intravenous contrast. Multiplanar CT image reconstructions and MIPs were obtained to evaluate the vascular anatomy. CONTRAST:  156mL OMNIPAQUE IOHEXOL 350 MG/ML SOLN COMPARISON:  Chest CTA 08/02/2019, radiograph earlier this day. FINDINGS: Cardiovascular: There are no filling defects within the pulmonary arteries to suggest pulmonary embolus. Right chest wall collaterals, patient with right upper extremity injection. Thoracic aorta is normal in caliber without dissection. Heart is normal in size. Few scattered coronary artery calcifications. Presumed contrast mixing at the IVC right atrial junction. Mediastinum/Nodes: Small mediastinal lymph nodes not enlarged by size criteria. There is no hilar adenopathy. Decompressed esophagus. No suspicious thyroid nodule. Lungs/Pleura: Persistent left lung base pleuroparenchymal opacity and small pleural effusion, but diminished pleural fluid from prior exam. Pleural thickening in the anterior left upper lobe unchanged. Areas of fissural thickening in the inter lobar fissure on the left, some of which appears nodular on axial images. New dependent opacity in the right lower lobe with air bronchograms. There is debris in the segmental right lower lobe bronchus in this region, series 9, image 76, suggesting this may be postobstructive. Minimal additional retained mucus in the right mainstem bronchus. Mild apical emphysema. Upper Abdomen: No acute findings. Musculoskeletal: Multiple Schmorl's nodes in the spine. There are no acute or suspicious osseous abnormalities. Review of the MIP images confirms the above findings. IMPRESSION: 1. No pulmonary embolus. 2. New dependent opacity in the right lower lobe with air bronchograms. There is debris in the segmental right lower lobe bronchus in  this region, suggesting this may be postobstructive. Minimal additional retained mucus in the right mainstem bronchus. 3. Persistent left lung base pleuroparenchymal opacity and small pleural effusion, diminished pleural fluid from prior exam. Favor chronic atelectasis/scarring. Emphysema (ICD10-J43.9). Electronically Signed   By: Keith Rake M.D.   On: 10/30/2019 16:09   DG Chest Port 1 View  Result Date: 10/30/2019 CLINICAL DATA:  Shortness of breath after PICC pulled EXAM: PORTABLE CHEST 1 VIEW COMPARISON:  08/02/2019 FINDINGS: Mild chronic interstitial prominence. No consolidation or edema. No pleural effusion or pneumothorax. No retained line. Cardiomediastinal contours are stable and within normal limits. IMPRESSION: No acute process in the chest. Electronically Signed   By: Macy Mis M.D.   On: 10/30/2019 13:38    EKG: Independently reviewed. RVR type changes on V1-2  Assessment/Plan Active Problems:   Aspiration pneumonia (HCC)   C. difficile colitis  C. difficile colitis, severe -With WBC more than 15,000, and abdominal tenderness and decreased appetite, likely will need more than 2 midnight hospital stay for pain control and agree with ID to escalate vancomycin from 125 mg to 500 mg every 6 hours.  Aspiration pneumonia versus obstructive pneumonia -ID recommend Unasyn, likely will  need repeat CT in 6 to 8 weeks to document resolution -PE ruled out  Chronic pelvic osteomyelitis -ID seems plan for restart as outpatient? -We will send a right arm venous Doppler to rule out DVT  Severe protein calorie malnutrition -On supplement and Marinol  Chronic pain syndrome -Continue home pain management regimen   DVT prophylaxis: Lovenox Code Status: Full code Family Communication: Wife at bedside Disposition Plan: Barrier for discharge depends on pain control and improvement of C. difficile colitis, likely will need more than 2 midnight hospital stay. Consults called:  ID Admission status: MedSurg admission   Emeline General MD [facility] Pager [phone]    10/30/2019, 7:28 PM

## 2019-10-30 NOTE — ED Provider Notes (Signed)
  Physical Exam  BP (!) 155/86   Pulse 84   Temp 97.9 F (36.6 C) (Oral)   Resp 12   Ht 6\' 3"  (1.905 m)   Wt 65.8 kg   SpO2 100%   BMI 18.12 kg/m   Physical Exam  ED Course/Procedures     Procedures  MDM  Received patient in signout.  Shortness of breath and infectious disease office.  Happened right after removing PICC line.  Reportedly was diaphoretic and ill-appearing.  Sent to the ER with suspicion of either pulmonary embolism or septic emboli.  CT scan did not show PE but it did show postobstructive pneumonia.  Discussed with Dr. .  Recommended Unasyn and admission to the hospital.  They will consult tomorrow.  May also increase vancomycin up to 500 mg from the 125 for his C. difficile.       Algis Liming, MD 10/30/19 [phone]

## 2019-10-30 NOTE — Telephone Encounter (Signed)
Patient needed EMS after today's appointment.  Lab tech Clydie Braun alerted CMA that patient had questions for Dr. Daiva Eves after lab draw. When walked into exam room patient was hyperventilating, had cold sweats, drained of color.  RN Tomasita Morrow was alerted that patient had altered stated and needed assessment.   RN Marcelino Duster contacted 911 at 12:11 requesting emergency services evaluate patient. Tomasita Morrow, RN and Dorie, RN assisted patient until MD arrived. Dr. Orvan Falconer was made aware of situation and did assessment on patient until Dr. Daiva Eves arrived in office. Patient was using oxygen during assessment. EMS arrived to office at 12:23 for support. Patient will be transported to [facility] ER.
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
## 2019-10-30 NOTE — Progress Notes (Signed)
Per Dr Daiva Eves 40 cm Single lumen Peripherally Inserted Central Catheter  removed from right basilic . No sutures present. Dressing was clean and dry . Area cleansed with chlorhexidine and petroleum dressing applied. Pt advised no heavy lifting with this arm, leave dressing for 24 hours and call the office if dressing becomes soaked with blood, sharp pain or numbness presents.  Pt tolerated procedure well.    Laurell Josephs, RN

## 2019-10-30 NOTE — ED Notes (Signed)
Report attempted, RN requested call back in .
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
## 2019-10-30 NOTE — ED Notes (Signed)
Per wife the fentanyl patch on the pts left deltoid was placed on 10-28-2019 and "needs to be replaced tonight at 8 pm".

## 2019-10-31 ENCOUNTER — Inpatient Hospital Stay (HOSPITAL_COMMUNITY): Payer: Self-pay

## 2019-10-31 ENCOUNTER — Telehealth: Payer: Self-pay | Admitting: Plastic Surgery

## 2019-10-31 DIAGNOSIS — I82621 Acute embolism and thrombosis of deep veins of right upper extremity: Secondary | ICD-10-CM

## 2019-10-31 DIAGNOSIS — R0602 Shortness of breath: Secondary | ICD-10-CM

## 2019-10-31 LAB — BASIC METABOLIC PANEL
Anion gap: 11 (ref 5–15)
BUN: 8 mg/dL (ref 6–20)
CO2: 25 mmol/L (ref 22–32)
Calcium: 9.1 mg/dL (ref 8.9–10.3)
Chloride: 105 mmol/L (ref 98–111)
Creatinine, Ser: 0.54 mg/dL — ABNORMAL LOW (ref 0.61–1.24)
GFR calc Af Amer: >60 mL/min (ref >60)
GFR calc non Af Amer: >60 mL/min (ref >60)
Glucose, Bld: 83 mg/dL (ref 70–99)
Potassium: 3.4 mmol/L — ABNORMAL LOW (ref 3.5–5.1)
Sodium: 141 mmol/L (ref 135–145)

## 2019-10-31 LAB — SEDIMENTATION RATE: Sed Rate: 50 mm/h — ABNORMAL HIGH (ref 0–20)

## 2019-10-31 LAB — CBC
HCT: 35.8 % — ABNORMAL LOW (ref 39.0–52.0)
Hemoglobin: 11.1 g/dL — ABNORMAL LOW (ref 13.0–17.0)
MCH: 31.3 pg (ref 26.0–34.0)
MCHC: 31 g/dL (ref 30.0–36.0)
MCV: 100.8 fL — ABNORMAL HIGH (ref 80.0–100.0)
Platelets: 243 10*3/uL (ref 150–400)
RBC: 3.55 MIL/uL — ABNORMAL LOW (ref 4.22–5.81)
RDW: 12.6 % (ref 11.5–15.5)
WBC: 9.4 10*3/uL (ref 4.0–10.5)
nRBC: 0 % (ref 0.0–0.2)

## 2019-10-31 LAB — BASIC METABOLIC PANEL WITH GFR
BUN/Creatinine Ratio: 18 (calc) (ref 6–22)
BUN: 10 mg/dL (ref 7–25)
CO2: 28 mmol/L (ref 20–32)
Calcium: 9.6 mg/dL (ref 8.6–10.3)
Chloride: 104 mmol/L (ref 98–110)
Creat: 0.56 mg/dL — ABNORMAL LOW (ref 0.70–1.33)
GFR, Est African American: 137 mL/min/{1.73_m2} (ref >=60)
GFR, Est Non African American: 118 mL/min/{1.73_m2} (ref >=60)
Glucose, Bld: 61 mg/dL — ABNORMAL LOW (ref 65–99)
Potassium: 3.8 mmol/L (ref 3.5–5.3)
Sodium: 141 mmol/L (ref 135–146)

## 2019-10-31 LAB — C-REACTIVE PROTEIN: CRP: 75.2 mg/L — ABNORMAL HIGH (ref <8.0)

## 2019-10-31 MED ORDER — JUVEN PO PACK
1.0000 | PACK | Freq: Two times a day (BID) | ORAL | Status: DC
  Administered 2019-10-31 – 2019-11-01 (×2): 1 via ORAL
  Filled 2019-10-31 (×2): qty 1

## 2019-10-31 MED ORDER — POTASSIUM CHLORIDE CRYS ER 20 MEQ PO TBCR
20.0000 meq | EXTENDED_RELEASE_TABLET | Freq: Once | ORAL | Status: AC
  Administered 2019-10-31: 20 meq via ORAL
  Filled 2019-10-31: qty 1

## 2019-10-31 MED ORDER — DICYCLOMINE HCL 20 MG PO TABS
20.0000 mg | ORAL_TABLET | Freq: Three times a day (TID) | ORAL | Status: DC
  Administered 2019-10-31 – 2019-11-01 (×7): 20 mg via ORAL
  Filled 2019-10-31 (×9): qty 1

## 2019-10-31 MED ORDER — ENOXAPARIN SODIUM 80 MG/0.8ML ~~LOC~~ SOLN
1.0000 mg/kg | Freq: Two times a day (BID) | SUBCUTANEOUS | Status: DC
  Administered 2019-10-31 – 2019-11-01 (×2): 65 mg via SUBCUTANEOUS
  Filled 2019-10-31 (×5): qty 0.65

## 2019-10-31 NOTE — Plan of Care (Signed)
  Problem: Nutrition: Goal: Adequate nutrition will be maintained Outcome: Progressing   

## 2019-10-31 NOTE — Telephone Encounter (Signed)
Patient's wife called to advise that he is back in the hospital and she is not sure how long he will be in there so she wanted to let Dr. Ulice Bold and [name] know so if they want to put his wound vac back on to keep infections off while he is in there, they can go over to the hospital. She said the wound vac was recently taken off.

## 2019-10-31 NOTE — Progress Notes (Signed)
PROGRESS NOTE    Christopher [name]  OJJ:[id number] DOB: [birth year] DOA: 10/30/2019 PCP: Ardith Dark, MD    Chief Complaint  Patient presents with  . Shortness of Breath    Post PICC line removal    Brief Narrative:  Christopher [name] is a 54 y.o. male with medical history significant of chronic left-sided pelvic osteomyelitis, recent C. difficile colitis diagnosed 3 days ago, anxiety, chronic colostomy presented with sudden onset of short of breath, sweating.  Patient has been on chronic IV antibiotics for a chronic left pelvic osteomyelitis, he has been on vancomycin and cefepime via a right arm PICC for 2 months which was discontinued earlier this week.  He had diarrhea for several weeks, and was tested positive for C. difficile on this Monday.  He has been following with Dr. Daiva Eves for his osteomyelitis, who started him on p.o. vancomycin.  Yesterday patient went to Dr. Lattie Haw office to have PICC line removed, soon after the PICC line was removed, patient suddenly started to feel extremely short of breath.  Dr. [name] suspect patient had a right arm DVT which dislodged because acute PE and sent patient to the ED.  Patient reported has frequent abdominal cramps, which significantly decreased his oral intake since last week.  ED Course: CTA negative for PE, but infiltrates of the right lower lobe suspecting pneumonia, WBC C 15  Assessment & Plan:   Active Problems:   Aspiration pneumonia (HCC)   C. difficile colitis  C. difficile colitis, severe -Presented with WBC more than 15,000, and abdominal tenderness and decreased appetite.  On p.o. vancomycin 500 mg every 6 hours.  Aspiration pneumonia versus obstructive pneumonia -ID recommend Unasyn, likely will need repeat CT in couple of weeks to evaluate for resolution. -PE ruled out  Chronic pelvic osteomyelitis -Per infectious disease note patient had hardware associated pelvic osteomyelitis now on second round of parenteral  antibiotics.  Currently on Unasyn. Further antibiotics per infectious disease.   DVT right brachial vein: -Ultrasound showing age-indeterminate DVT in the right brachial vein.  We will start him on treatment dose Lovenox. - The patient is also having loose stool from C. difficile explained to the patient that if he starts noticing any blood in his ostomy he needs to let the nurse know right away.  Severe protein calorie malnutrition -On supplement and Marinol  Chronic pain syndrome -Continue home pain management regimen  DVT prophylaxis: Treatment dose Lovenox Code Status: Full code Family Communication: No family at bedside Disposition:   Status is: Inpatient  Dispo: The patient is from: Home              Anticipated d/c is to: Home              Anticipated d/c date is: 2 days              Patient currently is not medically stable to d/c.        Consultants:   Infectious Disease  Procedures: None    Antimicrobials:  Anti-infectives (From admission, onward)   Start     Dose/Rate Route Frequency Ordered Stop   10/30/19 1800  vancomycin (VANCOCIN) 50 mg/mL oral solution 500 mg    Note to Pharmacy: As per ID recommendation   500 mg Oral Every 6 hours 10/30/19 1712 11/13/19 1759   10/30/19 1715  Ampicillin-Sulbactam (UNASYN) 3 g in sodium chloride 0.9 % 100 mL IVPB     3 g 200 mL/hr over 30 Minutes  Intravenous Every 6 hours 10/30/19 1710           Subjective: He is complaining of abdominal cramping, pain when he is having stool in the ostomy.  Objective: Vitals:   10/31/19 0407 10/31/19 0808 10/31/19 1445 10/31/19 2002  BP: (!) 147/88 (!) 161/92 (!) 154/90 (!) 168/145  Pulse: 86 93 90 89  Resp: 18 16 14 14   Temp: 97.8 F (36.6 C) (!) 97.5 F (36.4 C) 97.7 F (36.5 C) 98.5 F (36.9 C)  TempSrc: Oral Oral Oral Oral  SpO2: 92% 96% 97% 98%  Weight:      Height:        Intake/Output Summary (Last 24 hours) at 10/31/2019 2012 Last data filed at  10/31/2019 1300 Gross per 24 hour  Intake 1080 ml  Output 600 ml  Net 480 ml   Filed Weights   10/30/19 1303  Weight: 65.8 kg    Examination:  General exam: Thin, chronically ill-appearing male, not in any acute distress at this time.   Respiratory system: Clear to auscultation. Respiratory effort normal. Cardiovascular system: S1 & S2 heard, RRR. No JVD, murmurs, rubs, gallops or clicks. No pedal edema. Gastrointestinal system: Distended, mild tenderness without any guarding or rebound, ostomy, positive bowel sounds Central nervous system: Alert and oriented. No focal neurological deficits. Extremities: Symmetric 5 x 5 power. Skin: No rashes, lesions or ulcers Psychiatry: Judgement and insight appear normal. Mood & affect appropriate.     Data Reviewed: I have personally reviewed following labs and imaging studies  CBC: Recent Labs  Lab 10/30/19 1356 10/30/19 1417 10/31/19 0220  WBC 15.0*  --  9.4  NEUTROABS 13.4*  --   --   HGB 12.6* 12.6* 11.1*  HCT 40.0 37.0* 35.8*  MCV 101.0*  --  100.8*  PLT 267  --  243    Basic Metabolic Panel: Recent Labs  Lab 10/30/19 1218 10/30/19 1356 10/30/19 1417 10/31/19 0220  NA 141 141 140 141  K 3.8 3.9 3.7 3.4*  CL 104 103 102 105  CO2 28 28  --  25  GLUCOSE 61* 100* 94 83  BUN 10 8 10 8   CREATININE 0.56* 0.61 0.50* 0.54*  CALCIUM 9.6 9.3  --  9.1    GFR: Estimated Creatinine Clearance: 99.4 mL/min (A) (by C-G formula based on SCr of 0.54 mg/dL (L)).  Liver Function Tests: Recent Labs  Lab 10/30/19 1356  AST 19  ALT 19  ALKPHOS 120  BILITOT 0.2*  PROT 6.5  ALBUMIN 2.8*    CBG: No results for input(s): GLUCAP in the last 168 hours.   Recent Results (from the past 240 hour(s))  SARS Coronavirus 2 by RT PCR (hospital order, [facility]) Nasopharyngeal Nasopharyngeal Swab     Status: None   Collection Time: 10/30/19  7:46 PM   Specimen: Nasopharyngeal Swab  Result Value Ref Range  Status   SARS Coronavirus 2 NEGATIVE NEGATIVE Final    Comment: (NOTE) SARS-CoV-2 target nucleic acids are NOT DETECTED. The SARS-CoV-2 RNA is generally detectable in upper and lower respiratory specimens during the acute phase of infection. The lowest concentration of SARS-CoV-2 viral copies this assay can detect is 250 copies / mL. A negative result does not preclude SARS-CoV-2 infection and should not be used as the sole basis for treatment or other patient management decisions.  A negative result may occur with improper specimen collection / handling, submission of specimen other than nasopharyngeal swab, presence of viral  mutation(s) within the areas targeted by this assay, and inadequate number of viral copies (<250 copies / mL). A negative result must be combined with clinical observations, patient history, and epidemiological information. Fact Sheet for Patients:   StrictlyIdeas.no Fact Sheet for Healthcare Providers: BankingDealers.co.za This test is not yet approved or cleared  by the [place] FDA and has been authorized for detection and/or diagnosis of SARS-CoV-2 by FDA under an Emergency Use Authorization (EUA).  This EUA will remain in effect (meaning this test can be used) for the duration of the COVID-19 declaration under Section 564(b)(1) of the Act, 21 U.S.C. section 360bbb-3(b)(1), unless the authorization is terminated or revoked sooner. Performed at [facility], [place] [address] [place], [place] [address]          Radiology Studies: CT Angio Chest PE W/Cm &/Or Wo Cm  Result Date: 10/30/2019 CLINICAL DATA:  Shortness of breath. Acute onset of dyspnea after having a PICC removed from right arm. EXAM: CT ANGIOGRAPHY CHEST WITH CONTRAST TECHNIQUE: Multidetector CT imaging of the chest was performed using the standard protocol during bolus administration of intravenous contrast. Multiplanar CT image  reconstructions and MIPs were obtained to evaluate the vascular anatomy. CONTRAST:  137mL OMNIPAQUE IOHEXOL 350 MG/ML SOLN COMPARISON:  Chest CTA 08/02/2019, radiograph earlier this day. FINDINGS: Cardiovascular: There are no filling defects within the pulmonary arteries to suggest pulmonary embolus. Right chest wall collaterals, patient with right upper extremity injection. Thoracic aorta is normal in caliber without dissection. Heart is normal in size. Few scattered coronary artery calcifications. Presumed contrast mixing at the IVC right atrial junction. Mediastinum/Nodes: Small mediastinal lymph nodes not enlarged by size criteria. There is no hilar adenopathy. Decompressed esophagus. No suspicious thyroid nodule. Lungs/Pleura: Persistent left lung base pleuroparenchymal opacity and small pleural effusion, but diminished pleural fluid from prior exam. Pleural thickening in the anterior left upper lobe unchanged. Areas of fissural thickening in the inter lobar fissure on the left, some of which appears nodular on axial images. New dependent opacity in the right lower lobe with air bronchograms. There is debris in the segmental right lower lobe bronchus in this region, series 9, image 76, suggesting this may be postobstructive. Minimal additional retained mucus in the right mainstem bronchus. Mild apical emphysema. Upper Abdomen: No acute findings. Musculoskeletal: Multiple Schmorl's nodes in the spine. There are no acute or suspicious osseous abnormalities. Review of the MIP images confirms the above findings. IMPRESSION: 1. No pulmonary embolus. 2. New dependent opacity in the right lower lobe with air bronchograms. There is debris in the segmental right lower lobe bronchus in this region, suggesting this may be postobstructive. Minimal additional retained mucus in the right mainstem bronchus. 3. Persistent left lung base pleuroparenchymal opacity and small pleural effusion, diminished pleural fluid from prior  exam. Favor chronic atelectasis/scarring. Emphysema (ICD10-J43.9). Electronically Signed   By: Keith Rake M.D.   On: 10/30/2019 16:09   DG Chest Port 1 View  Result Date: 10/30/2019 CLINICAL DATA:  Shortness of breath after PICC pulled EXAM: PORTABLE CHEST 1 VIEW COMPARISON:  08/02/2019 FINDINGS: Mild chronic interstitial prominence. No consolidation or edema. No pleural effusion or pneumothorax. No retained line. Cardiomediastinal contours are stable and within normal limits. IMPRESSION: No acute process in the chest. Electronically Signed   By: Macy Mis M.D.   On: 10/30/2019 13:38   VAS [place] UPPER EXTREMITY VENOUS DUPLEX  Result Date: 10/31/2019 UPPER VENOUS STUDY  Indications: Sudden shortness of breath post PICC line removal Comparison  Study: No prior study on file for comparison Performing Technologist: Sherren Kerns RVS  Examination Guidelines: A complete evaluation includes B-mode imaging, spectral Doppler, color Doppler, and power Doppler as needed of all accessible portions of each vessel. Bilateral testing is considered an integral part of a complete examination. Limited examinations for reoccurring indications may be performed as noted.  Right Findings: +----------+------------+---------+-----------+----------+-----------------+ RIGHT     CompressiblePhasicitySpontaneousProperties     Summary      +----------+------------+---------+-----------+----------+-----------------+ IJV           Full       Yes       Yes                                +----------+------------+---------+-----------+----------+-----------------+ Subclavian    Full       Yes       Yes                                +----------+------------+---------+-----------+----------+-----------------+ Axillary      Full       Yes       Yes                                +----------+------------+---------+-----------+----------+-----------------+ Brachial    Partial      Yes       Yes               Age Indeterminate +----------+------------+---------+-----------+----------+-----------------+ Radial        Full                                                    +----------+------------+---------+-----------+----------+-----------------+ Ulnar         Full                                                    +----------+------------+---------+-----------+----------+-----------------+ Cephalic      Full                                                    +----------+------------+---------+-----------+----------+-----------------+ Basilic       Full                                                    +----------+------------+---------+-----------+----------+-----------------+  Left Findings: +----------+------------+---------+-----------+----------+-------+ LEFT      CompressiblePhasicitySpontaneousPropertiesSummary +----------+------------+---------+-----------+----------+-------+ Subclavian    Full       Yes       Yes                      +----------+------------+---------+-----------+----------+-------+  Summary:  Right: No evidence of superficial vein thrombosis in the upper extremity. Findings consistent with age indeterminate deep vein thrombosis involving the right brachial veins.  Left: No evidence of thrombosis in the subclavian.  *See table(s) above for measurements and observations.  Diagnosing physician: Sherald Hess MD Electronically signed by Sherald Hess MD on 10/31/2019 at 5:34:00 PM.    Final         Scheduled Meds: . benzonatate  200 mg Oral TID  . dicyclomine  20 mg Oral TID AC & HS  . dronabinol  2.5 mg Oral BID AC  . enoxaparin (LOVENOX) injection  1 mg/kg Subcutaneous Q12H  . feeding supplement (PRO-STAT SUGAR FREE 64)  30 mL Oral TID WC  . [START ON 11/02/2019] fentaNYL  1 patch Transdermal Q72H  . gabapentin  300 mg Oral TID  . guaiFENesin  1,200 mg Oral BID  . lactobacillus acidophilus  2 tablet Oral TID  . melatonin  3 mg  Oral QHS  . multivitamin with minerals  1 tablet Oral Daily  . nutrition supplement (JUVEN)  1 packet Oral BID BM  . PARoxetine  20 mg Oral QHS  . QUEtiapine  50 mg Oral QHS  . vancomycin  500 mg Oral Q6H   Continuous Infusions: . ampicillin-sulbactam (UNASYN) IV 3 g (10/31/19 1627)     LOS: 1 day     Vonzella Nipple, MD [facility]   To contact the attending provider between 7A-7P or the covering provider during after hours 7P-7A, please log into the web site [email or web address] and access using universal [place] password for that web site. If you do not have the password, please call the hospital operator.  10/31/2019, 8:12 PM

## 2019-10-31 NOTE — Consult Note (Signed)
WOC Nurse Consult Note: Patient receiving care in [facility] 5N19.  Patient able to turn self for posterior wound assessment. Reason for Consult: colostomy and sacral wound Wound type: Non healing, chronic wound with osteomyelitis and exposed bone to left upper buttock/lower back region.   Pressure Injury POA: Yes/No/NA Measurement: 2.3 cm x 1.3 cm x 2 cm Wound bed: pink with exposed bone at the inferior border Drainage (amount, consistency, odor) heavy yellow. Periwound: heavily scarred from traumatic injury and multiple surgeries Dressing procedure/placement/frequency: Wash the left upper buttock/lower back wound in the crease with saline.  Place a small piece of Aquacel [place] [phone]) into the wound. Cover with a small folded gauze, tape in place.  WOC Nurse ostomy follow up Stoma type/location:  LUQ colostomy Stomal assessment/size: 1.5 inches, pink, moist, pouch intact Peristomal assessment: deferred Treatment options for stomal/peristomal skin: barrier ring Output: brown, thin Ostomy pouching: 2pc.  Orders for 2 3/4 inches two piece pouch and skin barrier with barrier rings placed. Monitor the wound area(s) for worsening of condition such as: Signs/symptoms of infection,  Increase in size,  Development of or worsening of odor, Development of pain, or increased pain at the affected locations.  Notify the medical team if any of these develop.  Thank you for the consult.  Discussed plan of care with the patient.  WOC nurse will not follow at this time.  Please re-consult the WOC team if needed.  Helmut Muster, RN, MSN, CWOCN, CNS-BC, pager [phone]

## 2019-10-31 NOTE — Progress Notes (Signed)
Initial Nutrition Assessment  DOCUMENTATION CODES:   Underweight  INTERVENTION:   -Continue 30 ml Prostat TID, each supplement provides 100 kcals and 15 grams protein -MVI with minerals daily -Magic cup TID with meals, each supplement provides 290 kcal and 9 grams of protein -1 packet Juven BID, each packet provides 95 calories, 2.5 grams of protein (collagen), and 9.8 grams of carbohydrate (3 grams sugar); also contains 7 grams of L-arginine and L-glutamine, 300 mg vitamin C, 15 mg vitamin E, 1.2 mcg vitamin B-12, 9.5 mg zinc, 200 mg calcium, and 1.5 g  Calcium Beta-hydroxy-Beta-methylbutyrate to support wound healing  NUTRITION DIAGNOSIS:   Increased nutrient needs related to wound healing as evidenced by estimated needs.  GOAL:   Patient will meet greater than or equal to 90% of their needs  MONITOR:   PO intake, Supplement acceptance, Labs, Weight trends, Skin, I & O's  REASON FOR ASSESSMENT:   Malnutrition Screening Tool    ASSESSMENT:   Christopher [name] is a 54 y.o. male with medical history significant of chronic left-sided pelvic osteomyelitis, recent C. difficile colitis diagnosed 3 days ago, anxiety, chronic colostomy presented with sudden onset of short of breath, sweating.  Patient has been on chronic IV antibiotics for a chronic left pelvic osteomyelitis, he has been on vancomycin and cefepime via a right arm PICC for 2 months which was discontinued earlier this week.  He continues to have diarrhea for several weeks, and was tested positive for C. difficile on this Monday.  He has been following with Dr. Daiva Eves for his osteomyelitis, who started him on p.o. vancomycin yesterday.  Today patient went to Dr. Lattie Haw office to have PICC line removed, soon after the PICC line was removed, patient suddenly started to feel extremely short of breath.  Dr. [name] suspect patient had a right arm DVT which dislodged because acute PE and sent patient to the ED.  Patient and his  wife reported that patient has had " a lot of secretion and sinus congestion lately, which very hard to clear" he denied any fever chills, although he did have frequent cough with clear phlegm.  He denied any chest pain, no dysuria.  Patient reported has frequent abdominal cramps, which significantly decreased his oral intake since last week.  Pt admitted with severe c-diff.   Reviewed I/O's: -600 ml x 24 hours  Pt in with infectious disease at time of visit. Unable to obtain further nutrition-related history or complete nutrition-focused physical exam at this time.   Reviewed CWOCN note, pt with non healing, chronic wound with osteomyelitis and exposed bone to left upper buttock/lower back region.   Pt familiar to RD due to previous hospitalizations. Pt has a history of severe malnutrition, which is likely ongoing, however, unable to identify at this time. Pt with increased nutritional needs due to wound healing and would greatly benefit from addition of oral nutrition supplements.   Reviewed wt hx; wt has been stable over the past 3 months  Labs reviewed: K: 3.4.    Diet Order:   Diet Order            Diet regular Room service appropriate? Yes; Fluid consistency: Thin  Diet effective now              EDUCATION NEEDS:   No education needs have been identified at this time  Skin:  Skin Assessment: Skin Integrity Issues: Skin Integrity Issues:: Other (Comment) Other: Non healing, chronic wound with osteomyelitis and exposed bone to  left upper buttock/lower back region  Last BM:  10/30/19 (via colostomy)  Height:   Ht Readings from Last 1 Encounters:  10/30/19 6\' 3"  (1.905 m)    Weight:   Wt Readings from Last 1 Encounters:  10/30/19 65.8 kg    Ideal Body Weight:  89.1 kg  BMI:  Body mass index is 18.12 kg/m.  Estimated Nutritional Needs:   Kcal:  2400-2600  Protein:  130-145 grams  Fluid:  > 2.4 L    Loistine Chance, RD, LDN, [place] Registered Dietitian  II Certified Diabetes Care and Education Specialist Please refer to [facility] for RD and/or RD on-call/weekend/after hours pager

## 2019-10-31 NOTE — Progress Notes (Signed)
VASCULAR LAB PRELIMINARY  PRELIMINARY  PRELIMINARY  PRELIMINAR   Right upper extremity venous duplex completed.    Preliminary report:  See CV proc for preliminary results.  Lesle Chris, RN, report. Messaged Dr. Macky Lower with results.   Davied Nocito, RVT 10/31/2019, 3:27 PM

## 2019-11-01 DIAGNOSIS — I82621 Acute embolism and thrombosis of deep veins of right upper extremity: Secondary | ICD-10-CM

## 2019-11-01 DIAGNOSIS — M8668 Other chronic osteomyelitis, other site: Secondary | ICD-10-CM

## 2019-11-01 DIAGNOSIS — I82622 Acute embolism and thrombosis of deep veins of left upper extremity: Secondary | ICD-10-CM

## 2019-11-01 DIAGNOSIS — E876 Hypokalemia: Secondary | ICD-10-CM

## 2019-11-01 LAB — CBC
HCT: 36.6 % — ABNORMAL LOW (ref 39.0–52.0)
Hemoglobin: 11.4 g/dL — ABNORMAL LOW (ref 13.0–17.0)
MCH: 31.5 pg (ref 26.0–34.0)
MCHC: 31.1 g/dL (ref 30.0–36.0)
MCV: 101.1 fL — ABNORMAL HIGH (ref 80.0–100.0)
Platelets: 221 10*3/uL (ref 150–400)
RBC: 3.62 MIL/uL — ABNORMAL LOW (ref 4.22–5.81)
RDW: 12.7 % (ref 11.5–15.5)
WBC: 5.1 10*3/uL (ref 4.0–10.5)
nRBC: 0 % (ref 0.0–0.2)

## 2019-11-01 LAB — BASIC METABOLIC PANEL
Anion gap: 7 (ref 5–15)
BUN: 6 mg/dL (ref 6–20)
CO2: 28 mmol/L (ref 22–32)
Calcium: 9.2 mg/dL (ref 8.9–10.3)
Chloride: 107 mmol/L (ref 98–111)
Creatinine, Ser: 0.55 mg/dL — ABNORMAL LOW (ref 0.61–1.24)
GFR calc Af Amer: >60 mL/min (ref >60)
GFR calc non Af Amer: >60 mL/min (ref >60)
Glucose, Bld: 82 mg/dL (ref 70–99)
Potassium: 3.3 mmol/L — ABNORMAL LOW (ref 3.5–5.1)
Sodium: 142 mmol/L (ref 135–145)

## 2019-11-01 MED ORDER — POTASSIUM CHLORIDE CRYS ER 20 MEQ PO TBCR: 40.0000 meq | EXTENDED_RELEASE_TABLET | Freq: Once | ORAL | Status: DC

## 2019-11-01 MED ORDER — GUAIFENESIN ER 600 MG PO TB12: 600.0000 mg | ORAL_TABLET | Freq: Two times a day (BID) | ORAL | 0 refills | Status: DC

## 2019-11-01 MED ORDER — VANCOMYCIN HCL 250 MG PO CAPS: 500.0000 mg | ORAL_CAPSULE | Freq: Four times a day (QID) | ORAL | 0 refills | Status: AC

## 2019-11-01 MED ORDER — AMLODIPINE BESYLATE 5 MG PO TABS
5.0000 mg | ORAL_TABLET | Freq: Every day | ORAL | Status: DC
  Administered 2019-11-01: 5 mg via ORAL
  Filled 2019-11-01: qty 1

## 2019-11-01 MED ORDER — VANCOMYCIN 50 MG/ML ORAL SOLUTION: 500.0000 mg | Freq: Four times a day (QID) | ORAL | 0 refills | Status: DC

## 2019-11-01 MED ORDER — ELIQUIS DVT/PE STARTER PACK 5 MG PO TBPK: ORAL_TABLET | ORAL | 0 refills | Status: DC

## 2019-11-01 MED ORDER — DICYCLOMINE HCL 20 MG PO TABS: 20.0000 mg | ORAL_TABLET | Freq: Three times a day (TID) | ORAL | 0 refills | Status: DC

## 2019-11-01 MED ORDER — BACID PO TABS: 2.0000 | ORAL_TABLET | Freq: Three times a day (TID) | ORAL | 0 refills | Status: DC

## 2019-11-01 MED ORDER — MELATONIN 3 MG PO TABS: 3.0000 mg | ORAL_TABLET | Freq: Every day | ORAL | 0 refills | Status: AC

## 2019-11-01 MED ORDER — BENZONATATE 200 MG PO CAPS: 200.0000 mg | ORAL_CAPSULE | Freq: Three times a day (TID) | ORAL | 0 refills | Status: DC

## 2019-11-01 MED ORDER — AMLODIPINE BESYLATE 5 MG PO TABS: 5.0000 mg | ORAL_TABLET | Freq: Every day | ORAL | 0 refills | Status: DC

## 2019-11-01 MED ORDER — AMOXICILLIN-POT CLAVULANATE 875-125 MG PO TABS: 1.0000 | ORAL_TABLET | Freq: Two times a day (BID) | ORAL | 0 refills | Status: AC

## 2019-11-01 MED ORDER — POTASSIUM CHLORIDE CRYS ER 20 MEQ PO TBCR
40.0000 meq | EXTENDED_RELEASE_TABLET | Freq: Once | ORAL | Status: AC
  Administered 2019-11-01: 40 meq via ORAL
  Filled 2019-11-01: qty 2

## 2019-11-01 MED ORDER — ENOXAPARIN SODIUM 80 MG/0.8ML ~~LOC~~ SOLN: 1.0000 mg/kg | Freq: Two times a day (BID) | SUBCUTANEOUS | 0 refills | Status: DC

## 2019-11-01 MED FILL — BENZONATATE 200 MG CAPS: 200 | 7 days supply | Qty: 20 | Fill #0

## 2019-11-01 MED FILL — VANCOMYCIN HCL 250 MG CAP: 250 | 10 days supply | Qty: 80 | Fill #0

## 2019-11-01 MED FILL — FLORASTOR 250 MG CAPSULE: 250 | 4 days supply | Qty: 20 | Fill #0

## 2019-11-01 MED FILL — ELIQUIS STARTER PACK 5 MG T: 5 | 30 days supply | Qty: 74 | Fill #0

## 2019-11-01 MED FILL — MUCUS RELIEF 600 MG TB12: 600 | 5 days supply | Qty: 10 | Fill #0

## 2019-11-01 MED FILL — AMOX-CLAV 875-125 MG TABLET: 875-125 | 6 days supply | Qty: 12 | Fill #0

## 2019-11-01 MED FILL — AMLODIPINE BESYLATE 5 MG TA: 5 | 30 days supply | Qty: 30 | Fill #0

## 2019-11-01 MED FILL — MELATONIN 3 MG TABS: 3 | 10 days supply | Qty: 10 | Fill #0

## 2019-11-01 MED FILL — DICYCLOMINE 20 MG TABLET: 20 | 8 days supply | Qty: 30 | Fill #0

## 2019-11-01 NOTE — Progress Notes (Signed)
[facility]    Date of Admission:  10/30/2019   Total days of antibiotics 3           ID: Christopher [name] is a 54 y.o. male with chronic pelvic abscess, recently diagnosed with cdifficile admitted for acute shortness of breath after picc line Active Problems:   Hypokalemia   Aspiration pneumonia (HCC)   C. 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 He is anxious to get home. Cough improved  Medications:  . amLODipine  5 mg Oral Daily  . benzonatate  200 mg Oral TID  . dicyclomine  20 mg Oral TID AC & HS  . dronabinol  2.5 mg Oral BID AC  . enoxaparin (LOVENOX) injection  1 mg/kg Subcutaneous Q12H  . feeding supplement (PRO-STAT SUGAR FREE 64)  30 mL Oral TID WC  . [START ON 11/02/2019] fentaNYL  1 patch Transdermal Q72H  . gabapentin  300 mg Oral TID  . guaiFENesin  1,200 mg Oral BID  . lactobacillus acidophilus  2 tablet Oral TID  . melatonin  3 mg Oral QHS  . multivitamin with minerals  1 tablet Oral Daily  . nutrition supplement (JUVEN)  1 packet Oral BID BM  . PARoxetine  20 mg Oral QHS  . QUEtiapine  50 mg Oral QHS  . vancomycin  500 mg Oral Q6H    Objective: Vital signs in last 24 hours: Temp:  [98.5 F (36.9 C)-98.7 F (37.1 C)] 98.7 F (37.1 C) (05/21 0523) Pulse Rate:  [70-89] 70 (05/21 0523) Resp:  [14] 14 (05/21 0523) BP: (143-168)/(80-145) 146/80 (05/21 0523) SpO2:  [96 %-98 %] 96 % (05/21 0523) Physical Exam  Constitutional: He is oriented to person, place, and time. He appears well-developed and well-nourished. No distress.  HENT:  Mouth/Throat: Oropharynx is clear and moist. No oropharyngeal exudate.  Cardiovascular: Normal rate, regular rhythm and normal heart sounds. Exam reveals no gallop and no friction rub.  No murmur heard.  Pulmonary/Chest: Effort normal and breath sounds normal. No respiratory distress. He has no wheezes.    Abdominal: Soft. Bowel sounds are normal. He exhibits no distension. There is no tenderness.  Hip: scarring, chronic wound of left hip Neurological: He is alert and oriented to person, place, and time.  Skin: Skin is warm and dry. No rash noted. No erythema.  Psychiatric: He has a normal mood and affect. His behavior is normal.    Lab Results Recent Labs    10/31/19 0220 11/01/19 0602  WBC 9.4 5.1  HGB 11.1* 11.4*  HCT 35.8* 36.6*  NA 141 142  K 3.4* 3.3*  CL 105 107  CO2 25 28  BUN 8 6  CREATININE 0.54* 0.55*   Liver Panel Recent Labs    10/30/19 1356  PROT 6.5  ALBUMIN 2.8*  AST 19  ALT 19  ALKPHOS 120  BILITOT 0.2*   Sedimentation Rate Recent Labs    10/30/19 1218  ESRSEDRATE 50*   C-Reactive Protein Recent Labs    10/30/19 1218  CRP 75.2*    Microbiology: review Studies/Results: VAS [place] UPPER EXTREMITY VENOUS DUPLEX  Result Date: 10/31/2019 UPPER VENOUS STUDY  Indications: Sudden shortness of breath post PICC line removal Comparison Study: No prior study on file for comparison Performing Technologist: Sherren Kerns RVS  Examination Guidelines: A complete evaluation includes B-mode imaging, spectral Doppler, color Doppler,  and power Doppler as needed of all accessible portions of each vessel. Bilateral testing is considered an integral part of a complete examination. Limited examinations for reoccurring indications may be performed as noted.  Right Findings: +----------+------------+---------+-----------+----------+-----------------+ RIGHT     CompressiblePhasicitySpontaneousProperties     Summary      +----------+------------+---------+-----------+----------+-----------------+ IJV           Full       Yes       Yes                                +----------+------------+---------+-----------+----------+-----------------+ Subclavian    Full       Yes       Yes                                 +----------+------------+---------+-----------+----------+-----------------+ Axillary      Full       Yes       Yes                                +----------+------------+---------+-----------+----------+-----------------+ Brachial    Partial      Yes       Yes              Age Indeterminate +----------+------------+---------+-----------+----------+-----------------+ Radial        Full                                                    +----------+------------+---------+-----------+----------+-----------------+ Ulnar         Full                                                    +----------+------------+---------+-----------+----------+-----------------+ Cephalic      Full                                                    +----------+------------+---------+-----------+----------+-----------------+ Basilic       Full                                                    +----------+------------+---------+-----------+----------+-----------------+  Left Findings: +----------+------------+---------+-----------+----------+-------+ LEFT      CompressiblePhasicitySpontaneousPropertiesSummary +----------+------------+---------+-----------+----------+-------+ Subclavian    Full       Yes       Yes                      +----------+------------+---------+-----------+----------+-------+  Summary:  Right: No evidence of superficial vein thrombosis in the upper extremity. Findings consistent with age indeterminate deep vein thrombosis involving the right brachial veins.  Left: No evidence of thrombosis in the subclavian.  *See table(s) above for measurements and observations.  Diagnosing physician: Sherald Hess MD Electronically signed by Cristal Deer  Clark MD on 10/31/2019 at 5:34:00 PM.    Final      Assessment/Plan: DVT = continue on enoxaparin for treatment  ? Possible pneumonia = he has been on chronic antibiotics so unclear how long CXR finding have been  there. Will switch to total of 7 day course of amox/clav  Chronic pelvic osteomyelitis = will defer to dr [name] dam to manage chronic suppression  C.difficile = recommend to treat 14 days with oral vancomycin.  Will see back in ID clinic  [facility] Cell: [phone] Pager: [phone]  11/01/2019, 5:24 PM

## 2019-11-01 NOTE — Plan of Care (Signed)

## 2019-11-01 NOTE — Plan of Care (Signed)
  Problem: Education: Goal: Knowledge of General Education information will improve Description: Including pain rating scale, medication(s)/side effects and non-pharmacologic comfort measures Outcome: Progressing   Problem: Health Behavior/Discharge Planning: Goal: Ability to manage health-related needs will improve Outcome: Progressing   Problem: Coping: Goal: Level of anxiety will decrease Outcome: Progressing   Problem: Elimination: Goal: Will not experience complications related to bowel motility Outcome: Progressing   Problem: Safety: Goal: Ability to remain free from injury will improve Outcome: Progressing   Problem: Skin Integrity: Goal: Risk for impaired skin integrity will decrease Outcome: Progressing   

## 2019-11-01 NOTE — TOC Initial Note (Signed)
Transition of Care [facility]) - Initial/Assessment Note    Patient Details  Name: Christopher [name] MRN: [id number] Date of Birth: [birth year]  [facility] CM/SW Contact:    Curlene Labrum, RN Phone Number: 11/01/2019, 1:46 PM  Clinical Narrative:                 Case Management met with patient at the bedside regarding patient's discharge to home.  Patient lives at home with his wife and is currently active with [place] and worker's comp-  Maudry Diego [phone].  I called and spoke with Maudry Diego and notified her of patient discharge needs - patient was admitted for SOB, DVT in Right Brachial Vein.  Patient set up with [facility] for medications so that he will have all medications in hand prior to going home.  Called [facility] and talked with Glennie Hawk and notified her that patient is being discharged home today.  Dr. Hetty Blend Parker's office called and appointment made for Tuesday, 11/05/2019 at 1040.  I spoke with Dr. Barth Kirks and confirmed that all services were in place for discharge.  Once patient receives medications from [facility], he will be able to go home by [facility].  Worker's comp Therapist, sports is aware of discharge plan.  Expected Discharge Plan: [place] Barriers to Discharge: No Barriers Identified   Patient Goals and CMS Choice Patient states their goals for this hospitalization and ongoing recovery are:: I'm ready to go home today. CMS Medicare.gov Compare Post Acute Care list provided to:: Patient Choice offered to / list presented to : Patient  Expected Discharge Plan and Services Expected Discharge Plan: [place]   Discharge Planning Services: CM Consult Post Acute Care Choice: [place] arrangements for the past 2 months: [place] Arranged: RN, PT, OT(Called Glennie Hawk to reinstate [facility] for RN, PT, and OT) [place] Agency: [place] ([place]) Date [place]: 11/01/19 Time [place]: 1344 Representative spoke with at [place]: Glennie Hawk, [place] with [facility]  Prior Living Arrangements/Services Living arrangements for the past 2 months: [place] with:: Spouse Patient language and need for interpreter reviewed:: Yes Do you feel safe going back to the place where you live?: Yes      Need for Family Participation in Patient Care: Yes (Comment) Care giver support system in place?: Yes (comment) Current home services: Home OT, Home PT, Home RN(Currently active with [place]) Criminal Activity/Legal Involvement Pertinent to Current Situation/Hospitalization: No - Comment as needed  Activities of Daily Living Home Assistive Devices/Equipment: Ostomy supplies ADL Screening (condition at time of admission) Patient's cognitive ability adequate to safely complete daily activities?: Yes Is the patient deaf or have difficulty hearing?: No Does the patient have difficulty seeing, even when wearing glasses/contacts?: No Does the patient have difficulty concentrating, remembering, or making decisions?: No Patient able to express need for assistance with ADLs?: Yes Does the patient have difficulty dressing or bathing?: No Independently performs ADLs?: Yes (appropriate for developmental age) Does the patient have difficulty walking or climbing stairs?: Yes Weakness of Legs: Both Weakness of Arms/Hands: None  Permission Sought/Granted Permission sought to share information with : Case Manager Permission granted to share information with : Yes, Verbal Permission Granted     Permission granted  to share info w AGENCY: [facility], Maudry Diego [phone]  Permission granted to share info w Relationship: spouse     Emotional Assessment Appearance:: Appears stated age Attitude/Demeanor/Rapport: Engaged Affect (typically observed): Accepting Orientation: : Oriented to Self, Oriented to Place, Oriented to  Time,  Oriented to Situation Alcohol / Substance Use: Not Applicable Psych Involvement: No (comment)  Admission diagnosis:  DVT (deep venous thrombosis) (HCC) [I82.409] Postobstructive pneumonia [J18.9] C. difficile colitis [A04.72] Severe comorbid illness [R69] Chest pain, unspecified type [O16.0] Complication associated with peripherally inserted central catheter (PICC), initial encounter [T82.9XXA] Patient Active Problem List   Diagnosis Date Noted  . 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 Clostridium difficile colitis 10/30/2019  . Pulmonary embolism ([place]) 10/30/2019  . Aspiration pneumonia ([place]) 10/30/2019  . C. difficile colitis 10/30/2019  . Bone infection of pelvis ([place]) 09/18/2019  . Myofascial pain dysfunction syndrome 08/05/2019  . Protein-calorie malnutrition, severe 06/01/2019  . Kidney stone 05/30/2019  . Acute lower UTI 05/30/2019  . Encounter for long-term use of opiate analgesic 05/03/2019  . PICC (peripherally inserted central catheter) in place 04/10/2019  . Encounter for medication monitoring 04/10/2019  . Obstructive nephropathy 04/04/2019  . Intractable nausea and vomiting 04/04/2019  . Cough 03/26/2019  . Chronic osteomyelitis of pelvis, left ([place]) 03/07/2019  . Wound infection, posttraumatic 02/28/2019  . Hydronephrosis concurrent with and due to calculi of kidney and ureter 02/28/2019  . GERD (gastroesophageal reflux disease) 02/28/2019  . Suprapubic catheter ([place]) 02/28/2019  . Protein-calorie malnutrition, moderate ([place]) 02/28/2019  . 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 Reactive depression 01/29/2019  . Chronic pain due to trauma 01/29/2019  . Neuropathic pain 01/29/2019  . Colostomy status ([place]) 01/14/2019  . Insomnia 01/14/2019  . Tachycardia 01/14/2019  . Current use of proton pump inhibitor 01/14/2019  . Wound of buttock 12/28/2018  . Anxiety and depression 12/17/2018  . Hypokalemia 12/17/2018   . Debility 11/23/2018  . Chronic pain syndrome   . Multiple traumatic injuries   . Pressure injury of skin 08/13/2018  . Urethral injury 07/13/2018   PCP:  Vivi Barrack, MD Pharmacy:   [id number] [place], [place] - [address]Phone: [phone]: [phone] [facility] [address]Phone: [phone]: [phone] MZacarias PontesTransitions of [place] [address]Phone: [phone]: [phone]    Social Determinants of Health (SDOH) Interventions    Readmission Risk Interventions Readmission Risk Prevention Plan 11/01/2019 09/18/2019 04/06/2019  Transportation Screening Complete Complete -  PCP or Specialist Appt within 3-5 Days Complete - Complete  HRI or Home Care Consult Complete - Complete  Social Work Consult for R[place]anning/Counseling Complete - Complete  Palliative Care Screening Complete - Not Applicable  Medication Review (Press photographer Complete Complete Complete  PCP or Specialist appointment within 3-5 days of discharge - Complete -  PCP/Specialist Appt Not Complete comments - - -  HRI or [place]- Complete -  HC[place]or Home Care Consult Pt Refusal Comments - - -  SW Recovery Care/Counseling Consult - Complete -  SW Consult Not Complete Comments - - -  Palliative Care Screening - Complete -  [place]-  Complete -  Some recent data might be hidden

## 2019-11-01 NOTE — Discharge Summary (Signed)
Physician Discharge Summary  [name] HBZ:[id number] DOB: 1965/11/03 DOA: 10/30/2019  PCP: Ardith Dark, MD  Admit date: 10/30/2019 Discharge date: 11/01/2019  Admitted From: Home Disposition:  Home  Recommendations for Outpatient Follow-up:  1. Follow up with PCP in 1 weeks 2. Please obtain BMP/CBC/Magnesium in one week  Home Health: Yes Equipment/Devices: None  Discharge Condition: Stable CODE STATUS: Full Diet recommendation: Heart Healthy  Brief/Interim Summary: Christopher Weimar Brownfieldis a 54 y.o.malewith medical history significant ofchronic left-sided pelvic osteomyelitis, recent C. difficile colitis diagnosed few days ago, anxiety, chronic colostomy presented with sudden onset of short of breath, sweating. Patient has been on chronic IV antibiotics for a chronic left pelvic osteomyelitis, he has been on vancomycin and cefepime via a right arm PICCfor 2 months which was discontinued earlier this week. He had diarrhea for severalweeks, andwas tested positive for C. difficile. He has been following with Dr. Daiva Eves forhis osteomyelitis, whostarted him on p.o. vancomycin. Patient went to Dr. Lattie Haw office to have PICC line removed, soon after the PICC line was removed, patient suddenly started to feel extremely short of breath.Patient suspected to have right arm DVT which dislodged because acute PE and sent patient to the ED. He also reported has frequent abdominal cramps, which significantly decreased his oral intake since last week.  ED Course:CTA negative for PE, but infiltrates of the right lower lobe suspecting pneumonia,WBC C 15  C. difficile colitis, severe -Presented with WBC more than 15,000,and abdominal tenderness and decreased appetite.  On p.o. vancomycin 500 mg every 6 hours. -WBC count improved to 5.1 today.  Afebrile.  He says his symptoms are improving.  Aspiration pneumonia versus obstructive pneumonia -He received treatment with Unasyn per  infectious disease, likely will need repeat CT in couple of weeks to evaluate for resolution. -PE ruled out -He was seen by ID and they recommended for the patient to be discharged on Augmentin oral for total 7 days and on p.o. vancomycin to be continued for 14 days after he completes treatment with Augmentin.  Chronic pelvic osteomyelitis -Per infectious disease note patient had hardware associated pelvic osteomyelitis recently completed second round of parenteral antibiotics. He will follow-up with infectious disease clinic.   DVT right brachial vein: -Ultrasound showing age-indeterminate DVT in the right brachial vein.  Started him on treatment dose Lovenox. - The patient is also having loose stool from C. difficile explained to the patient that if he starts noticing any blood in his ostomy he needs to call his primary care right away. -He is being discharged on Eliquis.  Case management was able to get an appointment with his primary care physician on 11/05/2019 at which time he can be evaluated.  Severe protein calorie malnutrition -On supplement and Marinol  Chronic pain syndrome -Continue home pain management regimen  Hypokalemia: -Potassium has been replaced.  The hypokalemia is likely secondary to his loose stool from the C. difficile infection. -Recommend to check labs at the PCP appointment.  Recommend to check CBC, BMP, magnesium level and replace as indicated. He was seen by infectious disease and they are okay with the patient being discharged home.   Hypertension: -Patient noted to have high blood pressure therefore started on Norvasc.  He is advised to follow-up with his primary care physician and have his blood pressure monitored in the outpatient setting and medication adjusted accordingly.  Discharge Diagnoses:  Active Problems:   Hypokalemia   Aspiration pneumonia (HCC)   C. 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 He will follow-up with outpatient infectious disease and also with his outpatient primary care physician.  Discharge plan of care discussed with the patient.  He is being discharged on his home medications with new prescription for Eliquis, Augmentin and Norvasc for his high blood pressure. All questions answered appropriately to the best of my knowledge.  Patient verbalized understanding and is wanting to be discharged today.  Allergies as of 11/01/2019      Reactions   Omnicef [cefdinir] Nausea And Vomiting      Medication List    STOP taking these medications   citalopram 20 MG tablet Commonly known as: CELEXA   lidocaine 5 % Commonly known as: Lidoderm   prochlorperazine 5 MG tablet Commonly known as: COMPAZINE   vancomycin 250 MG capsule Commonly known as: VANCOCIN     TAKE these medications   acetaminophen 325 MG tablet Commonly known as: TYLENOL Take 2 tablets (650 mg total) by mouth every 6 (six) hours as needed for mild pain (or Fever >/= 101).   albuterol 108 (90 Base) MCG/ACT inhaler Commonly known as: VENTOLIN HFA TAKE 2 PUFFS BY MOUTH EVERY 6 HOURS AS NEEDED FOR WHEEZE OR SHORTNESS OF BREATH What changed: See the new instructions.   amLODipine 5 MG tablet Commonly known as: NORVASC Take 1 tablet (5 mg total) by mouth daily. Start taking on: Nov 02, 2019   amoxicillin-clavulanate 875-125 MG tablet Commonly known as: Augmentin Take 1 tablet by mouth every 12 (twelve) hours for 6 days.   benzonatate 200 MG capsule Commonly known as: TESSALON Take 1 capsule (200 mg total) by mouth 3 (three) times daily.   clonazePAM 0.5 MG tablet Commonly known as: KLONOPIN Take 0.5 tablets (0.25 mg total) by mouth 3 (three) times daily as needed for anxiety.   dicyclomine 20 MG tablet Commonly known as: BENTYL Take 1 tablet (20 mg total) by mouth 4 (four) times daily -  before meals and at bedtime.   dronabinol 2.5  MG capsule Commonly known as: MARINOL TAKE 1 CAPSULE (2.5 MG TOTAL) BY MOUTH 2 (TWO) TIMES DAILY BEFORE A MEAL.   Eliquis DVT/PE Starter Pack Generic drug: Apixaban Starter Pack (10mg  and 5mg ) Take as directed on package: start with two-5mg  tablets twice daily for 7 days. On day 8, switch to one-5mg  tablet twice daily.   feeding supplement (PRO-STAT SUGAR FREE 64) Liqd Take 30 mLs by mouth 3 (three) times daily with meals.   fentaNYL 50 MCG/HR Commonly known as: Gordon 1 patch onto the skin every other day.   gabapentin 300 MG capsule Commonly known as: NEURONTIN TAKE 1 CAPSULE BY MOUTH 3 TIMES A DAY   guaiFENesin 600 MG 12 hr tablet Commonly known as: MUCINEX Take 1 tablet (600 mg total) by mouth 2 (two) times daily.   lactobacillus acidophilus Tabs tablet Take 2 tablets by mouth 3 (three) times daily.   melatonin 3 MG Tabs tablet Commonly known as: [employer] Melatonin Take 1 tablet (3 mg total) by mouth at bedtime. What changed: See the new instructions.   methocarbamol 500 MG tablet Commonly known as: ROBAXIN TAKE 1 TABLET BY MOUTH EVERY 6 HOURS AS NEEDED FOR MUSCLE SPASMS What changed: See the new instructions.   multivitamin with minerals Tabs tablet Take 1 tablet by mouth daily.   ondansetron 4 MG tablet Commonly known as: Zofran Take 1-2 tablets (4-8 mg total) by  mouth every 8 (eight) hours as needed for nausea, vomiting or refractory nausea / vomiting (max dose in a day is 24 mg total).   oxyCODONE-acetaminophen 5-325 MG tablet Commonly known as: PERCOCET/ROXICET Take 1 tablet by mouth every 6 (six) hours as needed for severe pain. What changed: when to take this   PARoxetine 20 MG tablet Commonly known as: Paxil Take 1 tablet (20 mg total) by mouth at bedtime.   QUEtiapine 50 MG tablet Commonly known as: SEROQUEL Take 50 mg by mouth at bedtime.   vancomycin 50 mg/mL  oral solution Commonly known as: VANCOCIN Take 10 mLs (500 mg total) by mouth  every 6 (six) hours for 21 days.      Follow-up Information    Daiva Eves, [name], MD Follow up on 11/18/2019.   Specialty: Infectious Diseases Why: 9:30 am appointment with Dr. Daiva Eves.  Contact information: [address] [place] [address] [phone]        [facility] Follow up.   Specialty: Home Health Services Why: Your services with [facility] will continue for RN, PT, and OT.  You should receive a call within 24 hour for follow up therapies.       Ardith Dark, MD. Go on 11/01/2019.   Specialty: Family Medicine Why: You are scheduled for a primary care appointment on 11/05/2019 at 1040 with Dr. Jacquiline Doe. Contact information: [address] [place] [address] [phone]          Allergies  Allergen Reactions  . Omnicef [Cefdinir] Nausea And Vomiting    Consultations:  Infectious Disease  Procedures/Studies: CT Angio Chest PE W/Cm &/Or Wo Cm  Result Date: 10/30/2019 CLINICAL DATA:  Shortness of breath. Acute onset of dyspnea after having a PICC removed from right arm. EXAM: CT ANGIOGRAPHY CHEST WITH CONTRAST TECHNIQUE: Multidetector CT imaging of the chest was performed using the standard protocol during bolus administration of intravenous contrast. Multiplanar CT image reconstructions and MIPs were obtained to evaluate the vascular anatomy. CONTRAST:  OMNIPAQUE IOHEXOL 350 MG/ML SOLN COMPARISON:  Chest CTA 08/02/2019, radiograph earlier this day. FINDINGS: Cardiovascular: There are no filling defects within the pulmonary arteries to suggest pulmonary embolus. Right chest wall collaterals, patient with right upper extremity injection. Thoracic aorta is normal in caliber without dissection. Heart is normal in size. Few scattered coronary artery calcifications. Presumed contrast mixing at the IVC right atrial junction. Mediastinum/Nodes: Small mediastinal lymph nodes not enlarged by size criteria. There is no  hilar adenopathy. Decompressed esophagus. No suspicious thyroid nodule. Lungs/Pleura: Persistent left lung base pleuroparenchymal opacity and small pleural effusion, but diminished pleural fluid from prior exam. Pleural thickening in the anterior left upper lobe unchanged. Areas of fissural thickening in the inter lobar fissure on the left, some of which appears nodular on axial images. New dependent opacity in the right lower lobe with air bronchograms. There is debris in the segmental right lower lobe bronchus in this region, series 9, image 76, suggesting this may be postobstructive. Minimal additional retained mucus in the right mainstem bronchus. Mild apical emphysema. Upper Abdomen: No acute findings. Musculoskeletal: Multiple Schmorl's nodes in the spine. There are no acute or suspicious osseous abnormalities. Review of the MIP images confirms the above findings. IMPRESSION: 1. No pulmonary embolus. 2. New dependent opacity in the right lower lobe with air bronchograms. There is debris in the segmental right lower lobe bronchus in this region, suggesting this may be postobstructive. Minimal additional retained mucus  in the right mainstem bronchus. 3. Persistent left lung base pleuroparenchymal opacity and small pleural effusion, diminished pleural fluid from prior exam. Favor chronic atelectasis/scarring. Emphysema (ICD10-J43.9). Electronically Signed   By: Narda Rutherford M.D.   On: 10/30/2019 16:09   DG Chest Port 1 View  Result Date: 10/30/2019 CLINICAL DATA:  Shortness of breath after PICC pulled EXAM: PORTABLE CHEST 1 VIEW COMPARISON:  08/02/2019 FINDINGS: Mild chronic interstitial prominence. No consolidation or edema. No pleural effusion or pneumothorax. No retained line. Cardiomediastinal contours are stable and within normal limits. IMPRESSION: No acute process in the chest. Electronically Signed   By: Guadlupe Spanish M.D.   On: 10/30/2019 13:38   VAS [place] UPPER EXTREMITY VENOUS DUPLEX  Result  Date: 10/31/2019 UPPER VENOUS STUDY  Indications: Sudden shortness of breath post PICC line removal Comparison Study: No prior study on file for comparison Performing Technologist: Sherren Kerns RVS  Examination Guidelines: A complete evaluation includes B-mode imaging, spectral Doppler, color Doppler, and power Doppler as needed of all accessible portions of each vessel. Bilateral testing is considered an integral part of a complete examination. Limited examinations for reoccurring indications may be performed as noted.  Right Findings: +----------+------------+---------+-----------+----------+-----------------+ RIGHT     CompressiblePhasicitySpontaneousProperties     Summary      +----------+------------+---------+-----------+----------+-----------------+ IJV           Full       Yes       Yes                                +----------+------------+---------+-----------+----------+-----------------+ Subclavian    Full       Yes       Yes                                +----------+------------+---------+-----------+----------+-----------------+ Axillary      Full       Yes       Yes                                +----------+------------+---------+-----------+----------+-----------------+ Brachial    Partial      Yes       Yes              Age Indeterminate +----------+------------+---------+-----------+----------+-----------------+ Radial        Full                                                    +----------+------------+---------+-----------+----------+-----------------+ Ulnar         Full                                                    +----------+------------+---------+-----------+----------+-----------------+ Cephalic      Full                                                    +----------+------------+---------+-----------+----------+-----------------+  Basilic       Full                                                     +----------+------------+---------+-----------+----------+-----------------+  Left Findings: +----------+------------+---------+-----------+----------+-------+ LEFT      CompressiblePhasicitySpontaneousPropertiesSummary +----------+------------+---------+-----------+----------+-------+ Subclavian    Full       Yes       Yes                      +----------+------------+---------+-----------+----------+-------+  Summary:  Right: No evidence of superficial vein thrombosis in the upper extremity. Findings consistent with age indeterminate deep vein thrombosis involving the right brachial veins.  Left: No evidence of thrombosis in the subclavian.  *See table(s) above for measurements and observations.  Diagnosing physician: Sherald Hess MD Electronically signed by Sherald Hess MD on 10/31/2019 at 5:34:00 PM.    Final     Subjective:   Discharge Exam: Vitals:   11/01/19 0015 11/01/19 0523  BP: (!) 143/84 (!) 146/80  Pulse:  70  Resp:  14  Temp:  98.7 F (37.1 C)  SpO2:  96%   Vitals:   10/31/19 1445 10/31/19 2002 11/01/19 0015 11/01/19 0523  BP: (!) 154/90 (!) 168/145 (!) 143/84 (!) 146/80  Pulse: 90 89  70  Resp: 14 14  14   Temp: 97.7 F (36.5 C) 98.5 F (36.9 C)  98.7 F (37.1 C)  TempSrc: Oral Oral  Oral  SpO2: 97% 98%  96%  Weight:      Height:        General: Pt is alert, awake, not in acute distress Cardiovascular: RRR, S1/S2 +, no rubs, no gallops Respiratory: CTA bilaterally, no wheezing, no rhonchi Abdominal:  NT, ND, ostomy, bowel sounds + Extremities: no edema, no cyanosis    The results of significant diagnostics from this hospitalization (including imaging, microbiology, ancillary and laboratory) are listed below for reference.     Microbiology: Recent Results (from the past 240 hour(s))  SARS Coronavirus 2 by RT PCR (hospital order, performed in [facility] hospital lab) Nasopharyngeal Nasopharyngeal Swab     Status: None   Collection Time:  10/30/19  7:46 PM   Specimen: Nasopharyngeal Swab  Result Value Ref Range Status   SARS Coronavirus 2 NEGATIVE NEGATIVE Final    Comment: (NOTE) SARS-CoV-2 target nucleic acids are NOT DETECTED. The SARS-CoV-2 RNA is generally detectable in upper and lower respiratory specimens during the acute phase of infection. The lowest concentration of SARS-CoV-2 viral copies this assay can detect is 250 copies / mL. A negative result does not preclude SARS-CoV-2 infection and should not be used as the sole basis for treatment or other patient management decisions.  A negative result may occur with improper specimen collection / handling, submission of specimen other than nasopharyngeal swab, presence of viral mutation(s) within the areas targeted by this assay, and inadequate number of viral copies (<250 copies / mL). A negative result must be combined with clinical observations, patient history, and epidemiological information. Fact Sheet for Patients:   11/01/19 Fact Sheet for Healthcare Providers: BoilerBrush.com.cy This test is not yet approved or cleared  by the https://pope.com/ FDA and has been authorized for detection and/or diagnosis of SARS-CoV-2 by FDA under an Emergency Use Authorization (EUA).  This EUA will remain in effect (meaning this test can  be used) for the duration of the COVID-19 declaration under Section 564(b)(1) of the Act, 21 U.S.C. section 360bbb-3(b)(1), unless the authorization is terminated or revoked sooner. Performed at [facility], [address] [place], [place] [address]      Labs: BNP (last 3 results) No results for input(s): BNP in the last 8760 hours. Basic Metabolic Panel: Recent Labs  Lab 10/30/19 1218 10/30/19 1356 10/30/19 1417 10/31/19 0220 11/01/19 0602  NA 141 141 140 141 142  K 3.8 3.9 3.7 3.4* 3.3*  CL 104 103 102 105 107  CO2 28 28  --  25 28  GLUCOSE 61* 100* 94 83 82   BUN 10 8 10 8 6   CREATININE 0.56* 0.61 0.50* 0.54* 0.55*  CALCIUM 9.6 9.3  --  9.1 9.2   Liver Function Tests: Recent Labs  Lab 10/30/19 1356  AST 19  ALT 19  ALKPHOS 120  BILITOT 0.2*  PROT 6.5  ALBUMIN 2.8*   No results for input(s): LIPASE, AMYLASE in the last 168 hours. No results for input(s): AMMONIA in the last 168 hours. CBC: Recent Labs  Lab 10/30/19 1356 10/30/19 1417 10/31/19 0220 11/01/19 0602  WBC 15.0*  --  9.4 5.1  NEUTROABS 13.4*  --   --   --   HGB 12.6* 12.6* 11.1* 11.4*  HCT 40.0 37.0* 35.8* 36.6*  MCV 101.0*  --  100.8* 101.1*  PLT 267  --  243 221   Cardiac Enzymes: No results for input(s): CKTOTAL, CKMB, CKMBINDEX, TROPONINI in the last 168 hours. BNP: Invalid input(s): POCBNP CBG: No results for input(s): GLUCAP in the last 168 hours. D-Dimer No results for input(s): DDIMER in the last 72 hours. Hgb A1c No results for input(s): HGBA1C in the last 72 hours. Lipid Profile No results for input(s): CHOL, HDL, LDLCALC, TRIG, CHOLHDL, LDLDIRECT in the last 72 hours. Thyroid function studies No results for input(s): TSH, T4TOTAL, T3FREE, THYROIDAB in the last 72 hours.  Invalid input(s): FREET3 Anemia work up No results for input(s): VITAMINB12, FOLATE, FERRITIN, TIBC, IRON, RETICCTPCT in the last 72 hours. Urinalysis    Component Value Date/Time   COLORURINE YELLOW 10/30/2019 1428   APPEARANCEUR HAZY (A) 10/30/2019 1428   LABSPEC 1.025 10/30/2019 1428   PHURINE 5.0 10/30/2019 1428   GLUCOSEU NEGATIVE 10/30/2019 1428   HGBUR MODERATE (A) 10/30/2019 1428   BILIRUBINUR SMALL (A) 10/30/2019 1428   KETONESUR NEGATIVE 10/30/2019 1428   PROTEINUR 100 (A) 10/30/2019 1428   NITRITE NEGATIVE 10/30/2019 1428   LEUKOCYTESUR MODERATE (A) 10/30/2019 1428   Sepsis Labs Invalid input(s): PROCALCITONIN,  WBC,  LACTICIDVEN Microbiology Recent Results (from the past 240 hour(s))  SARS Coronavirus 2 by RT PCR (hospital order, performed in [facility]  hospital lab) Nasopharyngeal Nasopharyngeal Swab     Status: None   Collection Time: 10/30/19  7:46 PM   Specimen: Nasopharyngeal Swab  Result Value Ref Range Status   SARS Coronavirus 2 NEGATIVE NEGATIVE Final    Comment: (NOTE) SARS-CoV-2 target nucleic acids are NOT DETECTED. The SARS-CoV-2 RNA is generally detectable in upper and lower respiratory specimens during the acute phase of infection. The lowest concentration of SARS-CoV-2 viral copies this assay can detect is 250 copies / mL. A negative result does not preclude SARS-CoV-2 infection and should not be used as the sole basis for treatment or other patient management decisions.  A negative result may occur with improper specimen collection / handling, submission of specimen other than nasopharyngeal swab, presence  of viral mutation(s) within the areas targeted by this assay, and inadequate number of viral copies (<250 copies / mL). A negative result must be combined with clinical observations, patient history, and epidemiological information. Fact Sheet for Patients:   BoilerBrush.com.cyhttps://www.fda.gov/media/136312/download Fact Sheet for Healthcare Providers: https://pope.com/https://www.fda.gov/media/136313/download This test is not yet approved or cleared  by the [place] FDA and has been authorized for detection and/or diagnosis of SARS-CoV-2 by FDA under an Emergency Use Authorization (EUA).  This EUA will remain in effect (meaning this test can be used) for the duration of the COVID-19 declaration under Section 564(b)(1) of the Act, 21 U.S.C. section 360bbb-3(b)(1), unless the authorization is terminated or revoked sooner. Performed at [facility], [address] [place], [place] [address]      Time coordinating discharge: Over 30 minutes  SIGNED:   Vonzella NippleAnupama Minnie Shi, MD  [facility] 11/01/2019, 2:02 PM Pager on amion  If 7PM-7AM, please contact night-coverage [email or web address] Password [email or web address]

## 2019-11-04 ENCOUNTER — Telehealth: Payer: Self-pay

## 2019-11-04 ENCOUNTER — Telehealth: Payer: Self-pay | Admitting: Physical Medicine and Rehabilitation

## 2019-11-04 DIAGNOSIS — R079 Chest pain, unspecified: Secondary | ICD-10-CM

## 2019-11-04 DIAGNOSIS — T829XXA Unspecified complication of cardiac and vascular prosthetic device, implant and graft, initial encounter: Secondary | ICD-10-CM

## 2019-11-04 DIAGNOSIS — J189 Pneumonia, unspecified organism: Secondary | ICD-10-CM

## 2019-11-04 DIAGNOSIS — M86159 Other acute osteomyelitis, unspecified femur: Secondary | ICD-10-CM

## 2019-11-04 NOTE — Telephone Encounter (Signed)
Transition Care Management Follow-up Telephone Call  Date of discharge and from where:11/01/2019 [place]   How have you been since you were released from the hospital? Has increased cough after discharge.  Any questions or concerns? Yes  has addressed with home health.   Items Reviewed:  Did the pt receive and understand the discharge instructions provided? Yes   Medications obtained and verified? Yes   Any new allergies since your discharge? No   Dietary orders reviewed? Yes  Do you have support at home? Yes   Functional Questionnaire: (I = Independent and D = Dependent) ADLs:D uses walker or power wheel chair   Bathing/Dressing- D not able to get in shower due to wounds. Needs some assistance with dressing.   Meal Prep- D not able to do meal prep.   Eating- I able to do on own.   Maintaining continence- D has Cath and colostomy bag.   Transferring/Ambulation-  D can do with assistance depending on energy level.    Managing Meds- D wife has been giving medications at this time.   Follow up appointments reviewed:   PCP Hospital f/u appt confirmed? Yes  Scheduled to see PCP on 11/05/2019 @ 10:40.  Specialist Hospital f/u appt confirmed? Yes  Scheduled to see Surgery, Ortho, Rehab all scheduled   Are transportation arrangements needed? No   If their condition worsens, is the pt aware to call PCP or go to the Emergency Dept.? Yes  Was the patient provided with contact information for the PCP's office or ED? Yes  Was to pt encouraged to call back with questions or concerns? Yes

## 2019-11-04 NOTE — Telephone Encounter (Signed)
Information per spouse    Transition Care Management Follow-up Telephone Call  Date of discharge and from where: 11/01/2019 from [facility]  How have you been since you were released from the hospital? Ok, some pink tinged urine in catheter. Consulted urology and they said to monitor. Any questions or concerns? Yes  Will discuss at tomorrow's appointment.  Items Reviewed:  Did the pt receive and understand the discharge instructions provided? Yes   Medications obtained and verified? Yes   Any new allergies since your discharge? No   Dietary orders reviewed? Yes  Do you have support at home? Yes   Other (ie: DME, Home Health, etc) home health  Functional Questionnaire: (I = Independent and D = Dependent) ADL's: D  Bathing/Dressing- D   Meal Prep- D  Eating- I  Maintaining continence- D  Transferring/Ambulation- D  Managing Meds- D   Follow up appointments reviewed:    PCP Hospital f/u appt confirmed? Yes  Scheduled to see Dr. [name]  on 11/05/2019 @ 10:40..  Are transportation arrangements needed? No   If their condition worsens, is the pt aware to call  their PCP or go to the ED? Yes  Was the patient provided with contact information for the PCP's office or ED? Yes  Was the pt encouraged to call back with questions or concerns? Yes

## 2019-11-04 NOTE — Telephone Encounter (Signed)
Orders approved and given. 

## 2019-11-04 NOTE — Telephone Encounter (Signed)
Damita RN with [facility] needs to get verbal orders to see patient [id number].  Please call her at [phone].

## 2019-11-04 NOTE — Telephone Encounter (Signed)
Discussed with melanie on 10/31/19. Patient was d/c'ed on 11/01/19.

## 2019-11-05 ENCOUNTER — Other Ambulatory Visit: Payer: Self-pay

## 2019-11-05 ENCOUNTER — Telehealth: Payer: Self-pay

## 2019-11-05 ENCOUNTER — Ambulatory Visit (INDEPENDENT_AMBULATORY_CARE_PROVIDER_SITE_OTHER): Payer: No Typology Code available for payment source | Admitting: Family Medicine

## 2019-11-05 VITALS — BP 120/88 | HR 77 | Temp 98.4°F | Ht 75.0 in

## 2019-11-05 DIAGNOSIS — R030 Elevated blood-pressure reading, without diagnosis of hypertension: Secondary | ICD-10-CM

## 2019-11-05 DIAGNOSIS — I82622 Acute embolism and thrombosis of deep veins of left upper extremity: Secondary | ICD-10-CM

## 2019-11-05 DIAGNOSIS — I10 Essential (primary) hypertension: Secondary | ICD-10-CM | POA: Insufficient documentation

## 2019-11-05 DIAGNOSIS — Z9359 Other cystostomy status: Secondary | ICD-10-CM

## 2019-11-05 DIAGNOSIS — R319 Hematuria, unspecified: Secondary | ICD-10-CM

## 2019-11-05 DIAGNOSIS — J189 Pneumonia, unspecified organism: Secondary | ICD-10-CM

## 2019-11-05 DIAGNOSIS — J690 Pneumonitis due to inhalation of food and vomit: Secondary | ICD-10-CM

## 2019-11-05 DIAGNOSIS — A0472 Enterocolitis due to Clostridium difficile, not specified as recurrent: Secondary | ICD-10-CM

## 2019-11-05 LAB — CBC
HCT: 38.9 % — ABNORMAL LOW (ref 39.0–52.0)
Hemoglobin: 12.9 g/dL — ABNORMAL LOW (ref 13.0–17.0)
MCHC: 33.1 g/dL (ref 30.0–36.0)
MCV: 97.5 fl (ref 78.0–100.0)
Platelets: 337 10*3/uL (ref 150.0–400.0)
RBC: 3.99 Mil/uL — ABNORMAL LOW (ref 4.22–5.81)
RDW: 14 % (ref 11.5–15.5)
WBC: 9.4 10*3/uL (ref 4.0–10.5)

## 2019-11-05 LAB — COMPREHENSIVE METABOLIC PANEL
ALT: 27 U/L (ref 0–53)
AST: 21 U/L (ref 0–37)
Albumin: 3.8 g/dL (ref 3.5–5.2)
Alkaline Phosphatase: 153 U/L — ABNORMAL HIGH (ref 39–117)
BUN: 11 mg/dL (ref 6–23)
CO2: 29 mEq/L (ref 19–32)
Calcium: 9.9 mg/dL (ref 8.4–10.5)
Chloride: 103 mEq/L (ref 96–112)
Creatinine, Ser: 0.56 mg/dL (ref 0.40–1.50)
GFR: 152.55 mL/min (ref >60.00)
Glucose, Bld: 78 mg/dL (ref 70–99)
Potassium: 4.1 mEq/L (ref 3.5–5.1)
Sodium: 139 mEq/L (ref 135–145)
Total Bilirubin: 0.3 mg/dL (ref 0.2–1.2)
Total Protein: 6.7 g/dL (ref 6.0–8.3)

## 2019-11-05 LAB — MAGNESIUM: Magnesium: 2 mg/dL (ref 1.5–2.5)

## 2019-11-05 MED ORDER — ALBUTEROL SULFATE HFA 108 (90 BASE) MCG/ACT IN AERS: INHALATION_SPRAY | RESPIRATORY_TRACT | 1 refills | Status: DC

## 2019-11-05 MED ORDER — BLOOD PRESSURE CUFF MISC: 0 refills | Status: DC

## 2019-11-05 MED ORDER — SPIROMETER KIT: PACK | 0 refills | Status: DC

## 2019-11-05 NOTE — Telephone Encounter (Signed)
For some reason, they've had me doing H/H orders, except would care.   So it's fine as long I'm not doing the wound care.   Thanks, ML

## 2019-11-05 NOTE — Assessment & Plan Note (Signed)
Well-controlled today.  Has previously been at goal.  We will stop Norvasc.  Continue home monitoring goal 140/90 or lower.  Likely elevated in the hospital in setting of acute illness and acute pain.

## 2019-11-05 NOTE — Progress Notes (Signed)
Chief Complaint:  Christopher [name] is a 54 y.o. male who presents today for a TCM visit.  Assessment/Plan:  Chronic Problems Addressed Today: Elevated blood pressure reading Well-controlled today.  Has previously been at goal.  We will stop Norvasc.  Continue home monitoring goal 140/90 or lower.  Likely elevated in the hospital in setting of acute illness and acute pain.  Postobstructive pneumonia Normal exam today.  Likely has atelectasis due to recent immobility.  Discussed importance of sitting upright and working on incentive spirometer.  Will send in prescription for spirometer today.  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  We will recheck ultrasound in 3 months.  Will hopefully be able to stop anticoagulation at that point given removal of triggering event.  C. difficile colitis Improving.  He will continue oral vancomycin per ID.  Has follow-up with him next week.  Aspiration pneumonia ([place]) Continue full course of Augmentin.  He will also continue albuterol.  Also discussed importance of deep inspirations.  Suprapubic catheter ([place]) Hematuria likely secondary to known kidney stones.  Advised him to follow-up with urology soon.    Subjective:  HPI:  Summary of Hospital admission: Reason for admission: Pneumonia Date of admission: 10/30/2019 Date of discharge: 11/01/2019 Date of Interactive contact: 11/04/2019 Summary of Hospital course: Patient presented to the ED on 10/30/2019 with sudden onset shortness of breath and sweating after having a PICC pulled from his right arm. There was concern for PE.  He had CTA performed in the emergency room which was negative for PE however was found to have infiltrates concerning for pneumonia.  He was also found to have severe C. difficile colitis.  ID was consulted.  He was started on Unasyn.  He was able to be transitioned to oral antibiotics with regimen of oral vancomycin and Augmentin.  He was found  to have a DVT in his right brachial vein and was discharged on Eliquis.  He was additionally started on norvasc for elevated blood pressure readings.   Interim history outlined by problem:  Patient is here with his wife and case manager today.  He has been doing reasonably well since being discharged though still has quite a bit of cough.  Usually will cough up quite a bit of mucus in the morning.  Overall symptoms are stable.  They have noticed more blood in his suprapubic catheter since starting Eliquis.  They called urology who was told to continue to monitor for blood clots.  He does have a history of kidney stones.  He has not had any recurrent fevers or chills.  He has noticed with a bit more shortness of breath than normal but seems to be improving.  He has been tolerating his Eliquis otherwise well without any other significant side effects.  Seems to be having improvement in C. difficile symptoms as well.  ROS: Per HPI, otherwise a complete review of systems was negative.   PMH:  The following were reviewed and entered/updated in epic: Past Medical History:  Diagnosis Date  . Acute on chronic respiratory failure with hypoxia ([place]) 06/2018   trach removed 11-16-2018, on vent from jan until may 2020  . Anxiety   . Bacteremia due to Pseudomonas 06/2018  . Chronic pain syndrome   . Clostridium difficile colitis 10/30/2019  . History of kidney stones   . Multiple traumatic injuries   . Wound discharge    left hip wound with bloody/clear drainage change dressing q day surgilube  with gauze, between legs wound using calcium algenate pad bid   Patient Active Problem List   Diagnosis Date Noted  . Elevated blood pressure reading 11/05/2019  . Postobstructive pneumonia   . Acute osteomyelitis of pelvic region and thigh ([place])   . 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 Aspiration pneumonia ([place]) 10/30/2019  . C. difficile colitis 10/30/2019  . Myofascial pain  dysfunction syndrome 08/05/2019  . Protein-calorie malnutrition, severe 06/01/2019  . Kidney stone 05/30/2019  . Obstructive nephropathy 04/04/2019  . Intractable nausea and vomiting 04/04/2019  . Cough 03/26/2019  . Chronic osteomyelitis of pelvis, left ([place]) 03/07/2019  . Wound infection, posttraumatic 02/28/2019  . Hydronephrosis concurrent with and due to calculi of kidney and ureter 02/28/2019  . GERD (gastroesophageal reflux disease) 02/28/2019  . Suprapubic catheter ([place]) 02/28/2019  . Protein-calorie malnutrition, moderate ([place]) 02/28/2019  . 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 Reactive depression 01/29/2019  . Chronic pain due to trauma 01/29/2019  . Neuropathic pain 01/29/2019  . Colostomy status ([place]) 01/14/2019  . Insomnia 01/14/2019  . Tachycardia 01/14/2019  . Current use of proton pump inhibitor 01/14/2019  . Wound of buttock 12/28/2018  . Anxiety and depression 12/17/2018  . Debility 11/23/2018  . Chronic pain syndrome   . Multiple traumatic injuries   . Pressure injury of skin 08/13/2018  . Urethral injury 07/13/2018   Past Surgical History:  Procedure Laterality Date  . APPLICATION OF A-CELL OF BACK N/A 08/06/2018   Procedure: Application Of A-Cell Of Back;  Surgeon: [name], DO;  Location: [place];  Service: Plastics;  Laterality: N/A;  . APPLICATION OF A-CELL OF EXTREMITY Left 08/06/2018   Procedure: Application Of A-Cell Of Extremity;  Surgeon: [name], DO;  Location: [place];  Service: Plastics;  Laterality: Left;  . APPLICATION OF A-CELL OF EXTREMITY Left 09/18/2019   Procedure: APPLICATION OF A-CELL OF EXTREMITY;  Surgeon: [name], DO;  Location: [place];  Service: Plastics;  Laterality: Left;  . APPLICATION OF WOUND VAC  07/12/2018   Procedure: Application Of Wound Vac to the Left Thigh and Scrotum.;  Surgeon: Shona Needles, MD;  Location: [place];  Service: Orthopedics;;  . APPLICATION OF WOUND  VAC  07/10/2018   Procedure: Application Of Wound Vac;  Surgeon: Clovis Riley, MD;  Location: [place];  Service: General;;  . COLON SURGERY  2020   colostomy  . COLOSTOMY N/A 07/23/2018   Procedure: COLOSTOMY;  Surgeon: Georganna Skeans, MD;  Location: [place];  Service: General;  Laterality: N/A;  . CYSTOSCOPY W/ URETERAL STENT PLACEMENT N/A 07/15/2018   Procedure: RETROGRADE URETHROGRAM;  Surgeon: Franchot Gallo, MD;  Location: [place];  Service: Urology;  Laterality: N/A;  . CYSTOSCOPY WITH LITHOLAPAXY N/A 05/06/2019   Procedure: CYSTOSCOPY BASKET BLADDER STONE EXTRACTION;  Surgeon: Cleon Gustin, MD;  Location[facility];  Service: Urology;  Laterality: N/A;  30 MINS  . CYSTOSTOMY N/A 05/06/2019   Procedure: REPLACEMENT OF SUPRAPUBIC CATHETER;  Surgeon: Cleon Gustin, MD;  Location[facility];  Service: Urology;  Laterality: N/A;  . DEBRIDEMENT AND CLOSURE WOUND Left 03/04/2019   Procedure: Excision of hip wound with placement of Acell;  Surgeon: [name], DO;  Location: [place];  Service: Plastics;  Laterality: Left;  . ESOPHAGOGASTRODUODENOSCOPY N/A 08/14/2018   Procedure: ESOPHAGOGASTRODUODENOSCOPY (EGD);  Surgeon: Georganna Skeans, MD;  Location: [place];  Service: General;  Laterality:  N/A;  bedside  . FACIAL RECONSTRUCTION SURGERY     X 2--once as a teenager and second time in his 57's  . HARDWARE REMOVAL Left 03/04/2019   Procedure: Left Hip Hardware Removal;  Surgeon: Shona Needles, MD;  Location: [place];  Service: Orthopedics;  Laterality: Left;  . HIP PINNING,CANNULATED Left 07/12/2018   Procedure: CANNULATED HIP PINNING;  Surgeon: Shona Needles, MD;  Location: [place];  Service: Orthopedics;  Laterality: Left;  . HIP SURGERY    . HOLMIUM LASER APPLICATION Right 10/18/3092   Procedure: HOLMIUM LASER APPLICATION;  Surgeon: Cleon Gustin, MD;  Location: [facility];  Service: Urology;  Laterality: Right;  .  I & D EXTREMITY Left 07/25/2018   Procedure: Debridement of buttock, scrotum and left leg, placement of acell and vac;  Surgeon: [name], DO;  Location: [place];  Service: Plastics;  Laterality: Left;  . I & D EXTREMITY N/A 08/06/2018   Procedure: Debridement of buttock, scrotum and left leg;  Surgeon: [name], DO;  Location: [place];  Service: Plastics;  Laterality: N/A;  . I & D EXTREMITY N/A 08/13/2018   Procedure: Debridement of buttock, scrotum and left leg, placement of acell and vac;  Surgeon: [name], DO;  Location: [place];  Service: Plastics;  Laterality: N/A;  90 min, please  . INCISION AND DRAINAGE HIP Left 09/18/2019   Procedure: IRRIGATION AND DEBRIDEMENT HIP/ PELVIS WITH WOUND VAC PLACEMENT;  Surgeon: Shona Needles, MD;  Location: [place];  Service: Orthopedics;  Laterality: Left;  . INCISION AND DRAINAGE OF WOUND N/A 07/18/2018   Procedure: Debridement of left leg, buttocks and scrotal wound with placement of acell and Flexiseal;  Surgeon: [name], DO;  Location: [place];  Service: Plastics;  Laterality: N/A;  . INCISION AND DRAINAGE OF WOUND Left 08/29/2018   Procedure: Debridement of buttock, scrotum and left leg, placement of acell and vac;  Surgeon: [name], DO;  Location: [place];  Service: Plastics;  Laterality: Left;  75 min, please  . INCISION AND DRAINAGE OF WOUND Bilateral 10/23/2018   Procedure: DEBRIDEMENT OF BUTTOCK,SCROTUM, AND LEG WOUNDS WITH PLACEMENT OF ACELL- BILATERAL 90 MIN;  Surgeon: [name], DO;  Location: [place];  Service: Plastics;  Laterality: Bilateral;  . IR ANGIOGRAM PELVIS SELECTIVE OR SUPRASELECTIVE  07/10/2018  . IR ANGIOGRAM PELVIS SELECTIVE OR SUPRASELECTIVE  07/10/2018  . IR ANGIOGRAM SELECTIVE EACH ADDITIONAL VESSEL  07/10/2018  . IR EMBO ART  VEN HEMORR LYMPH EXTRAV  INC GUIDE ROADMAPPING  07/10/2018  . IR NEPHROSTOMY PLACEMENT LEFT  04/05/2019  . IR NEPHROSTOMY PLACEMENT RIGHT  05/31/2019  .  IR US GUIDE BX ASP/DRAIN  07/10/2018  . IR US GUIDE VASC ACCESS RIGHT  07/10/2018  . IR VENO/EXT/UNI LEFT  07/10/2018  . IRRIGATION AND DEBRIDEMENT OF WOUND WITH SPLIT THICKNESS SKIN GRAFT Left 09/19/2018   Procedure: Debridement of gluteal wound with placement of acell to left leg.;  Surgeon: [name], DO;  Location: [place];  Service: Plastics;  Laterality: Left;  2.5 hours, please  . LAPAROTOMY N/A 07/12/2018   Procedure: EXPLORATORY LAPAROTOMY;  Surgeon: Georganna Skeans, MD;  Location: [place];  Service: General;  Laterality: N/A;  . LAPAROTOMY N/A 07/15/2018   Procedure: WOUND EXPLORATION; CLOSURE OF ABDOMEN;  Surgeon: Georganna Skeans, MD;  Location: [place];  Service: General;  Laterality: N/A;  . LAPAROTOMY  07/10/2018   Procedure: Exploratory Laparotomy;  Surgeon: Kae Heller,  Victorino Sparrow, MD;  Location: [place];  Service: General;;  . MASS EXCISION Left 09/18/2019   Procedure: EXCISION UPPER LEFT INNER THIGH WOUND;  Surgeon: [name], DO;  Location: [place];  Service: Plastics;  Laterality: Left;  . NEPHROLITHOTOMY Right 07/15/2019   Procedure: NEPHROLITHOTOMY PERCUTANEOUS;  Surgeon: Cleon Gustin, MD;  Location: [facility];  Service: Urology;  Laterality: Right;  90 MINS  . PEG PLACEMENT N/A 08/14/2018   Procedure: PERCUTANEOUS ENDOSCOPIC GASTROSTOMY (PEG) PLACEMENT;  Surgeon: Georganna Skeans, MD;  Location: [place];  Service: General;  Laterality: N/A;  . PERCUTANEOUS TRACHEOSTOMY N/A 08/02/2018   Procedure: PERCUTANEOUS TRACHEOSTOMY;  Surgeon: Georganna Skeans, MD;  Location: [place];  Service: General;  Laterality: N/A;  . RADIOLOGY WITH ANESTHESIA N/A 07/10/2018   Procedure: IR WITH ANESTHESIA;  Surgeon: Sandi Mariscal, MD;  Location: [place];  Service: Radiology;  Laterality: N/A;  . RADIOLOGY WITH ANESTHESIA Right 07/10/2018   Procedure: Ir With Anesthesia;  Surgeon: Sandi Mariscal, MD;  Location: [place];  Service: Radiology;  Laterality: Right;  . SCROTAL EXPLORATION N/A  07/15/2018   Procedure: SCROTUM DEBRIDEMENT;  Surgeon: Franchot Gallo, MD;  Location: [place];  Service: Urology;  Laterality: N/A;  . SHOULDER SURGERY    . SKIN SPLIT GRAFT Right 09/19/2018   Procedure: Skin Graft Split Thickness;  Surgeon: [name], DO;  Location: [place];  Service: Plastics;  Laterality: Right;  . SKIN SPLIT GRAFT N/A 10/03/2018   Procedure: Split thickness skin graft to gluteal area with acell placement;  Surgeon: [name], DO;  Location: [place];  Service: Plastics;  Laterality: N/A;  3 hours, please  . VACUUM ASSISTED CLOSURE CHANGE N/A 07/12/2018   Procedure: ABDOMINAL VACUUM ASSISTED CLOSURE CHANGE and abdominal washout;  Surgeon: Georganna Skeans, MD;  Location: [place];  Service: General;  Laterality: N/A;  . WOUND DEBRIDEMENT Left 07/23/2018   Procedure: DEBRIDEMENT LEFT BUTTOCK  WOUND;  Surgeon: Georganna Skeans, MD;  Location: [place];  Service: General;  Laterality: Left;  . WOUND EXPLORATION Left 07/10/2018   Procedure: WOUND EXPLORATION LEFT GROIN;  Surgeon: Clovis Riley, MD;  Location[facility];  Service: General;  Laterality: Left;    Family History  Problem Relation Age of Onset  . Breast cancer Mother        with mets to the bones    Medications- Reconciled discharge and current medications in Epic.  Current Outpatient Medications  Medication Sig Dispense Refill  . acetaminophen (TYLENOL) 325 MG tablet Take 2 tablets (650 mg total) by mouth every 6 (six) hours as needed for mild pain (or Fever >/= 101).    Marland Kitchen albuterol (VENTOLIN HFA) 108 (90 Base) MCG/ACT inhaler TAKE 2 PUFFS BY MOUTH EVERY 6 HOURS AS NEEDED FOR WHEEZE OR SHORTNESS OF BREATH 18 g 1  . Amino Acids-Protein Hydrolys (FEEDING SUPPLEMENT, PRO-STAT SUGAR FREE 64,) LIQD Take 30 mLs by mouth 3 (three) times daily with meals. 887 mL 0  . amoxicillin-clavulanate (AUGMENTIN) 875-125 MG tablet Take 1 tablet by mouth every 12 (twelve) hours for 6 days. 12 tablet 0  . Apixaban Starter Pack,  34m and 522m (ELIQUIS DVT/PE STARTER PACK) Take as directed on package: start with two-33m68mablets twice daily for 7 days. On day 8, switch to one-33mg33mblet twice daily. 1 each 0  . benzonatate (TESSALON) 200 MG capsule Take 1 capsule (200 mg total) by mouth 3 (three) times daily. 20 capsule 0  . clonazePAM (KLONOPIN) 0.5 MG  tablet Take 0.5 tablets (0.25 mg total) by mouth 3 (three) times daily as needed for anxiety. 50 tablet 3  . dicyclomine (BENTYL) 20 MG tablet Take 1 tablet (20 mg total) by mouth 4 (four) times daily -  before meals and at bedtime. 30 tablet 0  . dronabinol (MARINOL) 2.5 MG capsule TAKE 1 CAPSULE (2.5 MG TOTAL) BY MOUTH 2 (TWO) TIMES DAILY BEFORE A MEAL. 60 capsule 5  . fentaNYL (DURAGESIC) 50 MCG/HR Place 1 patch onto the skin every other day. 15 patch 0  . gabapentin (NEURONTIN) 300 MG capsule TAKE 1 CAPSULE BY MOUTH 3 TIMES A DAY (Patient taking differently: Take 300 mg by mouth 3 (three) times daily. ) 90 capsule 6  . guaiFENesin (MUCINEX) 600 MG 12 hr tablet Take 1 tablet (600 mg total) by mouth 2 (two) times daily. 10 tablet 0  . lactobacillus acidophilus (BACID) TABS tablet Take 2 tablets by mouth 3 (three) times daily. 30 tablet 0  . melatonin ([employer] MELATONIN) 3 MG TABS tablet Take 1 tablet (3 mg total) by mouth at bedtime. 10 tablet 0  . methocarbamol (ROBAXIN) 500 MG tablet TAKE 1 TABLET BY MOUTH EVERY 6 HOURS AS NEEDED FOR MUSCLE SPASMS (Patient taking differently: Take 500 mg by mouth every 6 (six) hours as needed for muscle spasms. ) 100 tablet 1  . Multiple Vitamin (MULTIVITAMIN WITH MINERALS) TABS tablet Take 1 tablet by mouth daily.    . ondansetron (ZOFRAN) 4 MG tablet Take 1-2 tablets (4-8 mg total) by mouth every 8 (eight) hours as needed for nausea, vomiting or refractory nausea / vomiting (max dose in a day is 24 mg total). 120 tablet 5  . oxyCODONE-acetaminophen (PERCOCET/ROXICET) 5-325 MG tablet Take 1 tablet by mouth every 6 (six) hours as needed for severe  pain. (Patient taking differently: Take 1 tablet by mouth every 4 (four) hours as needed for severe pain. ) 120 tablet 0  . PARoxetine (PAXIL) 20 MG tablet Take 1 tablet (20 mg total) by mouth at bedtime. 30 tablet 5  . QUEtiapine (SEROQUEL) 50 MG tablet Take 50 mg by mouth at bedtime.     . vancomycin (VANCOCIN) 250 MG capsule Take 2 capsules (500 mg total) by mouth every 6 (six) hours for 21 days. 168 capsule 0  . Blood Pressure Monitoring (BLOOD PRESSURE CUFF) MISC Use daily as needed to check blood pressure. 1 each 0  . Respiratory Therapy Supplies (SPIROMETER) KIT Use 3-4 times per hour. 1 kit 0   No current facility-administered medications for this visit.    Allergies-reviewed and updated Allergies  Allergen Reactions  . Omnicef [Cefdinir] Nausea And Vomiting    Social History   Socioeconomic History  . Marital status: Married    Spouse name: Not on file  . Number of children: Not on file  . Years of education: Not on file  . Highest education level: Not on file  Occupational History  . Not on file  Tobacco Use  . Smoking status: Former Smoker    Packs/day: 1.00    Years: 20.00    Pack years: 20.00    Types: Cigarettes    Quit date: 07/10/2018    Years since quitting: 1.3  . Smokeless tobacco: Never Used  Substance and Sexual Activity  . Alcohol use: Never  . Drug use: Never  . Sexual activity: Yes  Other Topics Concern  . Not on file  Social History Narrative   ** Merged History Encounter **  Social Determinants of Health   Financial Resource Strain: Low Risk   . Difficulty of Paying Living Expenses: Not very hard  Food Insecurity: Food Insecurity Present  . Worried About Charity fundraiser in the Last Year: Sometimes true  . Ran Out of Food in the Last Year: Sometimes true  Transportation Needs: No Transportation Needs  . Lack of Transportation (Medical): No  . Lack of Transportation (Non-Medical): No  Physical Activity: Inactive  . Days of  Exercise per Week: 0 days  . Minutes of Exercise per Session: 0 min  Stress: Stress Concern Present  . Feeling of Stress : Rather much  Social Connections:   . Frequency of Communication with Friends and Family:   . Frequency of Social Gatherings with Friends and Family:   . Attends Religious Services:   . Active Member of Clubs or Organizations:   . Attends Archivist Meetings:   Marland Kitchen Marital Status:         Objective:  Physical Exam: BP 120/88 (BP Location: Left Arm, Patient Position: Sitting, Cuff Size: Normal)   Pulse 77   Temp 98.4 F (36.9 C) (Temporal)   Ht '6\' 3"'  (1.905 m)   SpO2 95%   BMI 18.12 kg/m   Gen: NAD, resting comfortably CV: RRR with no murmurs appreciated Pulm: NWOB, CTAB with no crackles, wheezes, or rhonchi MSK: No edema, cyanosis, or clubbing noted GU: Right hinged urine noted in urostomy bag. Skin: Warm, dry  Neuro: Grossly normal, moves all extremities Psych: Normal affect and thought content      Verdelle Valtierra M. Jerline Pain, MD 11/05/2019 11:24 AM

## 2019-11-05 NOTE — Assessment & Plan Note (Signed)
Hematuria likely secondary to known kidney stones.  Advised him to follow-up with urology soon.

## 2019-11-05 NOTE — Assessment & Plan Note (Addendum)
Normal exam today.  Likely has atelectasis due to recent immobility.  Discussed importance of sitting upright and working on incentive spirometer.  Will send in prescription for spirometer today.

## 2019-11-05 NOTE — Assessment & Plan Note (Signed)
Continue Eliquis.  We will recheck ultrasound in 3 months.  Will hopefully be able to stop anticoagulation at that point given removal of triggering event.

## 2019-11-05 NOTE — Assessment & Plan Note (Signed)
Continue full course of Augmentin.  He will also continue albuterol.  Also discussed importance of deep inspirations.

## 2019-11-05 NOTE — Patient Instructions (Signed)
It was very nice to see you today!  Please stop Norvasc.  Keep an eye on your blood pressure and let me know if persistently 140/90 or higher.  Please follow-up with urology soon.  I think the blood in your urinary catheter is due to a kidney stones.  Please let us know if you see any development of blood clots.  Please work on Development worker, international aid.  I will send in a prescription for a new incentive spirometer.  We will recheck the ultrasound of your arm in 3 months.  Please stay on the blood thinners until then.  Take care, Dr [name]  Please try these tips to maintain a healthy lifestyle:   Eat at least 3 REAL meals and 1-2 snacks per day.  Aim for no more than 5 hours between eating.  If you eat breakfast, please do so within one hour of getting up.    Each meal should contain half fruits/vegetables, one quarter protein, and one quarter carbs (no bigger than a computer mouse)   Cut down on sweet beverages. This includes juice, soda, and sweet tea.     Drink at least 1 glass of water with each meal and aim for at least 8 glasses per day   Exercise at least 150 minutes every week.

## 2019-11-05 NOTE — Telephone Encounter (Signed)
Do you want to approve this? It looks like he went back into hospital and the order was from hospitalist.  [facility] has already ok'd the SN.  I just want to be sure since typically it would fall to the PCP to sign HH orders.

## 2019-11-05 NOTE — Telephone Encounter (Signed)
Darnell PT with [facility] called [phone] she is requesting PT 2 X week X 1 week and 1 X week X 4 weeks to increase strength , endurance and balance.  Please return call

## 2019-11-05 NOTE — Assessment & Plan Note (Signed)
Improving.  He will continue oral vancomycin per ID.  Has follow-up with him next week.

## 2019-11-06 ENCOUNTER — Other Ambulatory Visit: Payer: Self-pay | Admitting: Physical Medicine and Rehabilitation

## 2019-11-06 NOTE — Progress Notes (Signed)
Please inform patient of the following:  Blood work is all STABLE. Do not need to make any adjustments to his treatment plan at this time and we will recheck the ultrasound of his arm in 3 months.

## 2019-11-06 NOTE — Telephone Encounter (Signed)
Approval given

## 2019-11-08 ENCOUNTER — Telehealth: Payer: Self-pay

## 2019-11-08 ENCOUNTER — Encounter
Payer: No Typology Code available for payment source | Attending: Physical Medicine and Rehabilitation | Admitting: Physical Medicine and Rehabilitation

## 2019-11-08 ENCOUNTER — Encounter: Payer: Self-pay | Admitting: Physical Medicine and Rehabilitation

## 2019-11-08 ENCOUNTER — Other Ambulatory Visit: Payer: Self-pay

## 2019-11-08 VITALS — BP 121/83 | HR 68 | Temp 98.7°F

## 2019-11-08 DIAGNOSIS — Z452 Encounter for adjustment and management of vascular access device: Secondary | ICD-10-CM | POA: Insufficient documentation

## 2019-11-08 DIAGNOSIS — S31829D Unspecified open wound of left buttock, subsequent encounter: Secondary | ICD-10-CM | POA: Diagnosis present

## 2019-11-08 DIAGNOSIS — Z933 Colostomy status: Secondary | ICD-10-CM | POA: Insufficient documentation

## 2019-11-08 DIAGNOSIS — M792 Neuralgia and neuritis, unspecified: Secondary | ICD-10-CM

## 2019-11-08 DIAGNOSIS — M25561 Pain in right knee: Secondary | ICD-10-CM

## 2019-11-08 DIAGNOSIS — N200 Calculus of kidney: Secondary | ICD-10-CM | POA: Diagnosis present

## 2019-11-08 DIAGNOSIS — F411 Generalized anxiety disorder: Secondary | ICD-10-CM | POA: Diagnosis present

## 2019-11-08 DIAGNOSIS — Z5181 Encounter for therapeutic drug level monitoring: Secondary | ICD-10-CM | POA: Diagnosis present

## 2019-11-08 DIAGNOSIS — G8921 Chronic pain due to trauma: Secondary | ICD-10-CM | POA: Diagnosis present

## 2019-11-08 DIAGNOSIS — T07XXXA Unspecified multiple injuries, initial encounter: Secondary | ICD-10-CM | POA: Diagnosis present

## 2019-11-08 DIAGNOSIS — G894 Chronic pain syndrome: Secondary | ICD-10-CM | POA: Diagnosis not present

## 2019-11-08 DIAGNOSIS — Z9359 Other cystostomy status: Secondary | ICD-10-CM | POA: Diagnosis present

## 2019-11-08 DIAGNOSIS — Z79891 Long term (current) use of opiate analgesic: Secondary | ICD-10-CM | POA: Diagnosis not present

## 2019-11-08 DIAGNOSIS — I82621 Acute embolism and thrombosis of deep veins of right upper extremity: Secondary | ICD-10-CM

## 2019-11-08 NOTE — Telephone Encounter (Signed)
He has appointment today- will see him then- thank you

## 2019-11-08 NOTE — Telephone Encounter (Signed)
Christopher [name] with [facility] called and wants verbal orders for OT 1 X Week for 3 Weeks and 1 week for 2 weeks starting [id number]  Also said patients right knee is swollen andwarm to touch call if needed [phone]

## 2019-11-08 NOTE — Progress Notes (Signed)
Subjective:    Patient ID: Christopher [name], male    DOB: 1966-01-15, 54 y.o.   MRN: [id number]  HPI    Blood clot in R arm from PICC- On Eliquis Has pneumonia Has C diff- on PO Vanc- has been on it- Tuesday last week. PO  Wound on L hip- noticed today ( changed wound VAC today)- wife believes cement in pelvis is coming flush with the wound.   Saw picture of wound- has blue/black stuff Cement???? At near top of wound- Dr Jena Gauss to see him Tuesday. Might do imaging.   Increased pain with it.   Stomach bothering him- cramping-  Wants everything to stop! It seems "the more they do, the more complications he gets".   On Probiotic- Florastor   Doesn't have attendant care; however,currently gets 8 hours of NONMEDICAL care/day- however wife is doing wound care 1-3x/day, giving meds, changing colostomy, flushing and changing foley;   Went to PCP- made Urology f/u -thinks might have another kidney stone- causing irritation.    Clogged foley-  From sediment- has to flush regularly.  Started having blood in urine- bright pink due to new Eliquis. Blood in urine starts and stops- no clots yet.    Still c/o R knee-  Pain and swelling- bothers him when he stands/walks.    Pain Inventory  Average Pain 9 Pain Right Now 8 My pain is sharp, burning, dull, stabbing, tingling and aching  In the last 24 hours, has pain interfered with the following? General activity 8 Relation with others 8 Enjoyment of life 9 What TIME of day is your pain at its worst? all Sleep (in general) Fair  Pain is worse with: walking, bending, sitting, inactivity, standing and some activites Pain improves with: rest and medication Relief from Meds: 5  Mobility use a walker how many minutes can you walk? 5 ability to climb steps?  no do you drive?  no use a wheelchair needs help with transfers  Function disabled: date disabled 07/10/2018 I need assistance with the following:  dressing, bathing,  toileting, meal prep, household duties and shopping  Neuro/Psych bladder control problems bowel control problems weakness numbness tremor tingling trouble walking spasms dizziness depression anxiety  Prior Studies Any changes since last visit?  yes x-rays CT/MRI  Physicians involved in your care Any changes since last visit?  no   Family History  Problem Relation Age of Onset  . Breast cancer Mother        with mets to the bones   Social History   Socioeconomic History  . Marital status: Married    Spouse name: Not on file  . Number of children: Not on file  . Years of education: Not on file  . Highest education level: Not on file  Occupational History  . Not on file  Tobacco Use  . Smoking status: Former Smoker    Packs/day: 1.00    Years: 20.00    Pack years: 20.00    Types: Cigarettes    Quit date: 07/10/2018    Years since quitting: 1.3  . Smokeless tobacco: Never Used  Substance and Sexual Activity  . Alcohol use: Never  . Drug use: Never  . Sexual activity: Yes  Other Topics Concern  . Not on file  Social History Narrative   ** Merged History Encounter **       Social Determinants of Health   Financial Resource Strain: Low Risk   . Difficulty of Paying Living Expenses: Not  very hard  Food Insecurity: Food Insecurity Present  . Worried About Programme researcher, broadcasting/film/videounning Out of Food in the Last Year: Sometimes true  . Ran Out of Food in the Last Year: Sometimes true  Transportation Needs: No Transportation Needs  . Lack of Transportation (Medical): No  . Lack of Transportation (Non-Medical): No  Physical Activity: Inactive  . Days of Exercise per Week: 0 days  . Minutes of Exercise per Session: 0 min  Stress: Stress Concern Present  . Feeling of Stress : Rather much  Social Connections:   . Frequency of Communication with Friends and Family:   . Frequency of Social Gatherings with Friends and Family:   . Attends Religious Services:   . Active Member of Clubs  or Organizations:   . Attends BankerClub or Organization Meetings:   Marland Kitchen. Marital Status:    Past Surgical History:  Procedure Laterality Date  . APPLICATION OF A-CELL OF BACK N/A 08/06/2018   Procedure: Application Of A-Cell Of Back;  Surgeon: Peggye Formillingham, Claire S, DO;  [facility];  Service: Plastics;  Laterality: N/A;  . APPLICATION OF A-CELL OF EXTREMITY Left 08/06/2018   Procedure: Application Of A-Cell Of Extremity;  Surgeon: Peggye Formillingham, Claire S, DO;  [facility];  Service: Plastics;  Laterality: Left;  . APPLICATION OF A-CELL OF EXTREMITY Left 09/18/2019   Procedure: APPLICATION OF A-CELL OF EXTREMITY;  Surgeon: Peggye Formillingham, Claire S, DO;  [facility];  Service: Plastics;  Laterality: Left;  . APPLICATION OF WOUND VAC  07/12/2018   Procedure: Application Of Wound Vac to the Left Thigh and Scrotum.;  Surgeon: Roby LoftsHaddix, Kevin P, MD;  [facility];  Service: Orthopedics;;  . APPLICATION OF WOUND VAC  07/10/2018   Procedure: Application Of Wound Vac;  Surgeon: Berna Bueonnor, Chelsea A, MD;  [facility];  Service: General;;  . COLON SURGERY  2020   colostomy  . COLOSTOMY N/A 07/23/2018   Procedure: COLOSTOMY;  Surgeon: Violeta Gelinashompson, Burke, MD;  [facility];  Service: General;  Laterality: N/A;  . CYSTOSCOPY W/ URETERAL STENT PLACEMENT N/A 07/15/2018   Procedure: RETROGRADE URETHROGRAM;  Surgeon: Marcine Matarahlstedt, Stephen, MD;  [facility];  Service: Urology;  Laterality: N/A;  . CYSTOSCOPY WITH LITHOLAPAXY N/A 05/06/2019   Procedure: CYSTOSCOPY BASKET BLADDER STONE EXTRACTION;  Surgeon: Malen GauzeMcKenzie, Patrick L, MD;  [facility] [place];  Service: Urology;  Laterality: N/A;  30 MINS  . CYSTOSTOMY N/A 05/06/2019   Procedure: REPLACEMENT OF SUPRAPUBIC CATHETER;  Surgeon: Malen GauzeMcKenzie, Patrick L, MD;  [facility] [place];  Service: Urology;  Laterality: N/A;  . DEBRIDEMENT AND CLOSURE WOUND Left 03/04/2019   Procedure: Excision of hip wound with placement of Acell;  Surgeon:  Peggye Formillingham, Claire S, DO;  [facility];  Service: Plastics;  Laterality: Left;  . ESOPHAGOGASTRODUODENOSCOPY N/A 08/14/2018   Procedure: ESOPHAGOGASTRODUODENOSCOPY (EGD);  Surgeon: Violeta Gelinashompson, Burke, MD;  [facility];  Service: General;  Laterality: N/A;  bedside  . FACIAL RECONSTRUCTION SURGERY     X 2--once as a teenager and second time in his 4030's  . HARDWARE REMOVAL Left 03/04/2019   Procedure: Left Hip Hardware Removal;  Surgeon: Roby LoftsHaddix, Kevin P, MD;  [facility];  Service: Orthopedics;  Laterality: Left;  . HIP PINNING,CANNULATED Left 07/12/2018   Procedure: CANNULATED HIP PINNING;  Surgeon: Roby LoftsHaddix, Kevin P, MD;  [facility];  Service: Orthopedics;  Laterality: Left;  . HIP SURGERY    . HOLMIUM LASER APPLICATION Right 07/15/2019   Procedure: HOLMIUM LASER APPLICATION;  Surgeon: Cleon Gustin, MD;  Location[facility];  Service: Urology;  Laterality: Right;  . I & D EXTREMITY Left 07/25/2018   Procedure: Debridement of buttock, scrotum and left leg, placement of acell and vac;  Surgeon: [name], DO;  Location: [place];  Service: Plastics;  Laterality: Left;  . I & D EXTREMITY N/A 08/06/2018   Procedure: Debridement of buttock, scrotum and left leg;  Surgeon: [name], DO;  Location: [place];  Service: Plastics;  Laterality: N/A;  . I & D EXTREMITY N/A 08/13/2018   Procedure: Debridement of buttock, scrotum and left leg, placement of acell and vac;  Surgeon: [name], DO;  Location: [place];  Service: Plastics;  Laterality: N/A;  90 min, please  . INCISION AND DRAINAGE HIP Left 09/18/2019   Procedure: IRRIGATION AND DEBRIDEMENT HIP/ PELVIS WITH WOUND VAC PLACEMENT;  Surgeon: Shona Needles, MD;  Location: [place];  Service: Orthopedics;  Laterality: Left;  . INCISION AND DRAINAGE OF WOUND N/A 07/18/2018   Procedure: Debridement of left leg, buttocks and scrotal wound with placement of acell and Flexiseal;  Surgeon: [name], DO;  Location: [place];  Service: Plastics;  Laterality: N/A;  . INCISION AND DRAINAGE OF WOUND Left 08/29/2018   Procedure: Debridement of buttock, scrotum and left leg, placement of acell and vac;  Surgeon: [name], DO;  Location: [place];  Service: Plastics;  Laterality: Left;  75 min, please  . INCISION AND DRAINAGE OF WOUND Bilateral 10/23/2018   Procedure: DEBRIDEMENT OF BUTTOCK,SCROTUM, AND LEG WOUNDS WITH PLACEMENT OF ACELL- BILATERAL 90 MIN;  Surgeon: [name], DO;  Location: [place];  Service: Plastics;  Laterality: Bilateral;  . IR ANGIOGRAM PELVIS SELECTIVE OR SUPRASELECTIVE  07/10/2018  . IR ANGIOGRAM PELVIS SELECTIVE OR SUPRASELECTIVE  07/10/2018  . IR ANGIOGRAM SELECTIVE EACH ADDITIONAL VESSEL  07/10/2018  . IR EMBO ART  VEN HEMORR LYMPH EXTRAV  INC GUIDE ROADMAPPING  07/10/2018  . IR NEPHROSTOMY PLACEMENT LEFT  04/05/2019  . IR NEPHROSTOMY PLACEMENT RIGHT  05/31/2019  . IR US GUIDE BX ASP/DRAIN  07/10/2018  . IR US GUIDE VASC ACCESS RIGHT  07/10/2018  . IR VENO/EXT/UNI LEFT  07/10/2018  . IRRIGATION AND DEBRIDEMENT OF WOUND WITH SPLIT THICKNESS SKIN GRAFT Left 09/19/2018   Procedure: Debridement of gluteal wound with placement of acell to left leg.;  Surgeon: [name], DO;  Location: [place];  Service: Plastics;  Laterality: Left;  2.5 hours, please  . LAPAROTOMY N/A 07/12/2018   Procedure: EXPLORATORY LAPAROTOMY;  Surgeon: Georganna Skeans, MD;  Location: [place];  Service: General;  Laterality: N/A;  . LAPAROTOMY N/A 07/15/2018   Procedure: WOUND EXPLORATION; CLOSURE OF ABDOMEN;  Surgeon: Georganna Skeans, MD;  Location: [place];  Service: General;  Laterality: N/A;  . LAPAROTOMY  07/10/2018   Procedure: Exploratory Laparotomy;  Surgeon: Clovis Riley, MD;  Location: [place];  Service: General;;  . MASS EXCISION Left 09/18/2019   Procedure: EXCISION UPPER LEFT INNER THIGH WOUND;  Surgeon: [name], DO;  Location: [place];  Service: Plastics;   Laterality: Left;  . NEPHROLITHOTOMY Right 07/15/2019   Procedure: NEPHROLITHOTOMY PERCUTANEOUS;  Surgeon: Cleon Gustin, MD;  [facility];  Service: Urology;  Laterality: Right;  90 MINS  . PEG PLACEMENT N/A 08/14/2018   Procedure: PERCUTANEOUS ENDOSCOPIC GASTROSTOMY (PEG) PLACEMENT;  Surgeon: Georganna Skeans, MD;  Location: [place];  Service: General;  Laterality:  N/A;  . PERCUTANEOUS TRACHEOSTOMY N/A 08/02/2018   Procedure: PERCUTANEOUS TRACHEOSTOMY;  Surgeon: Violeta Gelinas, MD;  [facility];  Service: General;  Laterality: N/A;  . RADIOLOGY WITH ANESTHESIA N/A 07/10/2018   Procedure: IR WITH ANESTHESIA;  Surgeon: Simonne Come, MD;  Location: [facility] OR;  Service: Radiology;  Laterality: N/A;  . RADIOLOGY WITH ANESTHESIA Right 07/10/2018   Procedure: Ir With Anesthesia;  Surgeon: Simonne Come, MD;  Location: [facility] OR;  Service: Radiology;  Laterality: Right;  . SCROTAL EXPLORATION N/A 07/15/2018   Procedure: SCROTUM DEBRIDEMENT;  Surgeon: Marcine Matar, MD;  Location: [facility] OR;  Service: Urology;  Laterality: N/A;  . SHOULDER SURGERY    . SKIN SPLIT GRAFT Right 09/19/2018   Procedure: Skin Graft Split Thickness;  Surgeon: Peggye Form, DO;  [facility];  Service: Plastics;  Laterality: Right;  . SKIN SPLIT GRAFT N/A 10/03/2018   Procedure: Split thickness skin graft to gluteal area with acell placement;  Surgeon: Peggye Form, DO;  [facility];  Service: Plastics;  Laterality: N/A;  3 hours, please  . VACUUM ASSISTED CLOSURE CHANGE N/A 07/12/2018   Procedure: ABDOMINAL VACUUM ASSISTED CLOSURE CHANGE and abdominal washout;  Surgeon: Violeta Gelinas, MD;  Location: [facility] OR;  Service: General;  Laterality: N/A;  . WOUND DEBRIDEMENT Left 07/23/2018   Procedure: DEBRIDEMENT LEFT BUTTOCK  WOUND;  Surgeon: Violeta Gelinas, MD;  Location: [facility] OR;  Service: General;  Laterality: Left;  . WOUND EXPLORATION Left 07/10/2018   Procedure: WOUND EXPLORATION LEFT  GROIN;  Surgeon: Berna Bue, MD;  [facility] OR;  Service: General;  Laterality: Left;   Past Medical History:  Diagnosis Date  . Acute on chronic respiratory failure with hypoxia (HCC) 06/2018   trach removed 11-16-2018, on vent from jan until may 2020  . Anxiety   . Bacteremia due to Pseudomonas 06/2018  . Chronic pain syndrome   . Clostridium difficile colitis 10/30/2019  . History of kidney stones   . Multiple traumatic injuries   . Wound discharge    left hip wound with bloody/clear drainage change dressing q day surgilube with gauze, between legs wound using calcium algenate pad bid   BP 121/83   Pulse 68   Temp 98.7 F (37.1 C)   SpO2 95%   Opioid Risk Score:   Fall Risk Score:  `1  Depression screen PHQ 2/9  Depression screen [facility] 2/9 04/10/2019 12/27/2018  Decreased Interest 0 2  Down, Depressed, Hopeless 0 0  PHQ - 2 Score 0 2  Altered sleeping - 1  Tired, decreased energy - 2  Change in appetite - 1  Feeling bad or failure about yourself  - 1  Trouble concentrating - 0  Moving slowly or fidgety/restless - 2  Suicidal thoughts - 0  PHQ-9 Score - 9  Some recent data might be hidden   Review of Systems  Constitutional: Positive for appetite change.  Gastrointestinal: Positive for abdominal pain, diarrhea and nausea.  Musculoskeletal: Positive for gait problem.  Neurological: Positive for dizziness, weakness and numbness.  Psychiatric/Behavioral: Positive for confusion. The patient is nervous/anxious.   All other systems reviewed and are negative.      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  Swelling of R knee- effusion noted;  TTP over  medial aspect of patella- esp but entire patella Crepitus with ROM of R knee-     Assessment & Plan:   Pt  is a 54 yr old male with hx of Multitrauma- causing L femoral neck fx, degloving of L hip to going/scrotum, bladder neck trauma- got SPC, developed compartment syndrome -s/p surgery for that; also diverting colostomy, skin grafts, and s/p trachand PEG- PEG is out. 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 B/L tennis elbow new-and B/L pending/forming ulnar neuropathy  1. Try Activia- for more probiotics- at least 1 daily, if possible.   2. Can't do trigger point injections with wound/C diff  infections. Will wait.   3.  Try a mixture- vinegar and saline- - to flush foley to prevent clogging- 1 tablesoon to 1 Liter saline- - flush 1-3x/week.   4. Episport elbow brace-  B/L Ask to get B/L- for both elbows- to take pressure off elbows when sitting in power w/c, chair.   5. Not able to do as much with PT and OT  6. Suggest MRI of R knee to determine cause of R knee pain, making it difficult to walk. Ordered.    7. F/U in 4 weeks- double appointment  I spent a total of 50 minutes on appointment- as detailed above.

## 2019-11-08 NOTE — Patient Instructions (Signed)
Pt is a 54 yr old male with hx of Multitrauma- causing L femoral neck fx, degloving of L hip to going/scrotum, bladder neck trauma- got SPC, developed compartment syndrome -s/p surgery for that; also diverting colostomy, skin grafts, and s/p trachand PEG- PEG is out. 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 B/L tennis elbow new-and B/L pending/forming ulnar neuropathy  1. Try Activia- for more probiotics- at least 1 daily, if possible.   2. Can't do trigger point injections with wound/C diff  infections. Will wait.   3.  Try a mixture- vinegar and saline- - to flush foley to prevent clogging- 1 tablesoon to 1 Liter saline- - flush 1-3x/week.   4. Episport elbow brace-  B/L Ask to get B/L- for both elbows- to take pressure off elbows when sitting in power w/c, chair.   5. Not able to do as much with PT and OT  6. Suggest MRI of R knee to determine cause of R knee pain, making it difficult to walk. Ordered.    7. F/U in 4 weeks- double appointment

## 2019-11-09 ENCOUNTER — Emergency Department (HOSPITAL_COMMUNITY)
Admission: EM | Admit: 2019-11-09 | Discharge: 2019-11-10 | Disposition: A | Discharge status: Home / Self Care | Payer: Self-pay | Attending: Emergency Medicine | Admitting: Emergency Medicine

## 2019-11-09 DIAGNOSIS — N390 Urinary tract infection, site not specified: Secondary | ICD-10-CM | POA: Insufficient documentation

## 2019-11-09 DIAGNOSIS — Z87891 Personal history of nicotine dependence: Secondary | ICD-10-CM | POA: Insufficient documentation

## 2019-11-09 DIAGNOSIS — Z79899 Other long term (current) drug therapy: Secondary | ICD-10-CM | POA: Insufficient documentation

## 2019-11-09 DIAGNOSIS — N3289 Other specified disorders of bladder: Secondary | ICD-10-CM | POA: Insufficient documentation

## 2019-11-09 MED ORDER — ONDANSETRON 4 MG PO TBDP
4.0000 mg | ORAL_TABLET | Freq: Once | ORAL | Status: AC
  Administered 2019-11-10: 4 mg via ORAL
  Filled 2019-11-09: qty 1

## 2019-11-09 MED ORDER — FENTANYL CITRATE (PF) 100 MCG/2ML IJ SOLN
50.0000 ug | Freq: Once | INTRAMUSCULAR | Status: AC
  Administered 2019-11-10: 50 ug via INTRAMUSCULAR
  Filled 2019-11-09: qty 2

## 2019-11-09 NOTE — ED Triage Notes (Signed)
Pt from home by [facility] for suprapubic pain after having home health change is his catheter this morning. Reported a clot in catheter tubing this morning that home health nurse flushed. Pt has been having increased pain since catheter changed. Wound vac noted to L hip.
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
## 2019-11-10 ENCOUNTER — Other Ambulatory Visit: Payer: Self-pay

## 2019-11-10 LAB — URINALYSIS, ROUTINE W REFLEX MICROSCOPIC
Bilirubin Urine: NEGATIVE
Glucose, UA: NEGATIVE mg/dL
Ketones, ur: NEGATIVE mg/dL
Nitrite: NEGATIVE
Protein, ur: 30 mg/dL — AB
Specific Gravity, Urine: 1.015 (ref 1.005–1.030)
WBC, UA: >50 WBC/hpf — ABNORMAL HIGH (ref 0–5)
pH: 6 (ref 5.0–8.0)

## 2019-11-10 LAB — CBC WITH DIFFERENTIAL/PLATELET
Abs Immature Granulocytes: 0.03 10*3/uL (ref 0.00–0.07)
Basophils Absolute: 0.1 10*3/uL (ref 0.0–0.1)
Basophils Relative: 1 %
Eosinophils Absolute: 0.5 10*3/uL (ref 0.0–0.5)
Eosinophils Relative: 7 %
HCT: 41.7 % (ref 39.0–52.0)
Hemoglobin: 13.1 g/dL (ref 13.0–17.0)
Immature Granulocytes: 0 %
Lymphocytes Relative: 22 %
Lymphs Abs: 1.6 10*3/uL (ref 0.7–4.0)
MCH: 31.6 pg (ref 26.0–34.0)
MCHC: 31.4 g/dL (ref 30.0–36.0)
MCV: 100.7 fL — ABNORMAL HIGH (ref 80.0–100.0)
Monocytes Absolute: 0.5 10*3/uL (ref 0.1–1.0)
Monocytes Relative: 6 %
Neutro Abs: 4.6 10*3/uL (ref 1.7–7.7)
Neutrophils Relative %: 64 %
Platelets: 339 10*3/uL (ref 150–400)
RBC: 4.14 MIL/uL — ABNORMAL LOW (ref 4.22–5.81)
RDW: 13.2 % (ref 11.5–15.5)
WBC: 7.4 10*3/uL (ref 4.0–10.5)
nRBC: 0 % (ref 0.0–0.2)

## 2019-11-10 LAB — BASIC METABOLIC PANEL
Anion gap: 9 (ref 5–15)
BUN: 14 mg/dL (ref 6–20)
CO2: 27 mmol/L (ref 22–32)
Calcium: 10 mg/dL (ref 8.9–10.3)
Chloride: 104 mmol/L (ref 98–111)
Creatinine, Ser: 0.63 mg/dL (ref 0.61–1.24)
GFR calc Af Amer: >60 mL/min (ref >60)
GFR calc non Af Amer: >60 mL/min (ref >60)
Glucose, Bld: 97 mg/dL (ref 70–99)
Potassium: 4.6 mmol/L (ref 3.5–5.1)
Sodium: 140 mmol/L (ref 135–145)

## 2019-11-10 MED ORDER — FOSFOMYCIN TROMETHAMINE 3 G PO PACK
3.0000 g | PACK | Freq: Once | ORAL | Status: AC
  Administered 2019-11-10: 3 g via ORAL
  Filled 2019-11-10: qty 3

## 2019-11-10 MED ORDER — OXYBUTYNIN CHLORIDE 5 MG PO TABS
5.0000 mg | ORAL_TABLET | Freq: Once | ORAL | Status: AC
  Administered 2019-11-10: 5 mg via ORAL
  Filled 2019-11-10: qty 1

## 2019-11-10 MED ORDER — FOSFOMYCIN TROMETHAMINE 3 G PO PACK: 3.0000 g | PACK | ORAL | 0 refills | Status: AC

## 2019-11-10 MED ORDER — BELLADONNA ALKALOIDS-OPIUM 16.2-60 MG RE SUPP
1.0000 | Freq: Once | RECTAL | Status: AC
  Administered 2019-11-10: 1 via RECTAL
  Filled 2019-11-10: qty 1

## 2019-11-10 MED ORDER — HYDROMORPHONE HCL 1 MG/ML IJ SOLN
1.0000 mg | Freq: Once | INTRAMUSCULAR | Status: AC
  Administered 2019-11-10: 1 mg via INTRAMUSCULAR
  Filled 2019-11-10: qty 1

## 2019-11-10 MED ORDER — HYDROMORPHONE HCL 1 MG/ML IJ SOLN
1.0000 mg | Freq: Once | INTRAMUSCULAR | Status: AC
  Administered 2019-11-10: 1 mg via INTRAVENOUS
  Filled 2019-11-10: qty 1

## 2019-11-10 NOTE — ED Notes (Signed)
Pt discharge instructions reviewed with the patient. Pt discharged.

## 2019-11-10 NOTE — ED Notes (Signed)
Attempted to irrigate bladder with approximately 120 mL with 3 separate attempts. Was able to retreive all but the last 10 mL. Pt stated that he had pain with fluid going in and out. Pt states that he is is in more pain after irrigation attempt.

## 2019-11-10 NOTE — ED Notes (Signed)
[facility] called @ 1147-per Alycia Rossetti, RN called by Marylene Land

## 2019-11-10 NOTE — ED Provider Notes (Signed)
Patient care assumed at 0700.  D/w Dr. Marlou Porch with Urology.  Will treat with B and O suppository for pain and start fosphomycin for possible UTI.  Plan to d/c home with outpatient follow up and return precautions.     Tilden Fossa, MD 11/10/19 [phone]

## 2019-11-11 LAB — URINE CULTURE: Culture: 10000 — AB

## 2019-11-12 ENCOUNTER — Telehealth: Payer: Self-pay

## 2019-11-12 ENCOUNTER — Ambulatory Visit: Payer: Self-pay | Admitting: Surgical

## 2019-11-12 ENCOUNTER — Encounter: Payer: Self-pay | Admitting: Surgical

## 2019-11-12 ENCOUNTER — Other Ambulatory Visit: Payer: Self-pay

## 2019-11-12 ENCOUNTER — Ambulatory Visit (INDEPENDENT_AMBULATORY_CARE_PROVIDER_SITE_OTHER): Payer: No Typology Code available for payment source | Admitting: Surgical

## 2019-11-12 VITALS — BP 117/85 | HR 83 | Temp 98.4°F

## 2019-11-12 DIAGNOSIS — S31829D Unspecified open wound of left buttock, subsequent encounter: Secondary | ICD-10-CM

## 2019-11-12 DIAGNOSIS — M86652 Other chronic osteomyelitis, left thigh: Secondary | ICD-10-CM

## 2019-11-12 DIAGNOSIS — M8660 Other chronic osteomyelitis, unspecified site: Secondary | ICD-10-CM
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
## 2019-11-12 NOTE — Telephone Encounter (Signed)
Visit notes/plan of care faxed to [facility]

## 2019-11-13 ENCOUNTER — Telehealth: Payer: Self-pay

## 2019-11-13 NOTE — Telephone Encounter (Signed)
Clydie Braun from [facility] called wanting to clarify wound care order changes. She would also like to know if his visits can be reduced to once a week since he no longer has a wound vac.

## 2019-11-14 ENCOUNTER — Telehealth: Payer: Self-pay

## 2019-11-14 NOTE — Telephone Encounter (Signed)
Christopher [name] [facility] called [phone]- she said she called last week and asked for verbal for oT 1 x week for 3 weeks and 1 wk for 2 weeks starting [id number]

## 2019-11-14 NOTE — Telephone Encounter (Signed)
I approved that and I think sent back to clinical pool to let them know.  Please approve the therapy. Thanks, ML

## 2019-11-14 NOTE — Telephone Encounter (Signed)
Faxed orders and visit summary to [facility] & to Laretta Bolster- RN Case Mgr. With [facility]

## 2019-11-15 ENCOUNTER — Telehealth: Payer: Self-pay | Admitting: Physical Medicine and Rehabilitation

## 2019-11-15 NOTE — Telephone Encounter (Signed)
Christopher [name] nurse case manager is needing an order for 4. Episport elbow brace-  B/L Ask to get B/L- for both elbows- to take pressure off elbows when sitting in power w/c, chair.  This was discussed at last appt.  Please fax this to her at [phone].

## 2019-11-15 NOTE — Telephone Encounter (Signed)
Written out and given to April to fax- also wrote fax number on back of Rx

## 2019-11-15 NOTE — Telephone Encounter (Signed)
Home health contacted. LVM.  Verbal orders given per Dr. Berline Chough

## 2019-11-16 LAB — DRUG TOX MONITOR 1 W/CONF, ORAL FLD
Amphetamines: NEGATIVE ng/mL (ref <10)
Barbiturates: NEGATIVE ng/mL (ref <10)
Benzodiazepines: NEGATIVE ng/mL (ref <0.50)
Buprenorphine: NEGATIVE ng/mL (ref <0.10)
Cocaine: NEGATIVE ng/mL (ref <5.0)
Codeine: NEGATIVE ng/mL (ref <2.5)
Dihydrocodeine: NEGATIVE ng/mL (ref <2.5)
Heroin Metabolite: NEGATIVE ng/mL (ref <1.0)
Hydrocodone: NEGATIVE ng/mL (ref <2.5)
Hydromorphone: NEGATIVE ng/mL (ref <2.5)
MARIJUANA: NEGATIVE ng/mL (ref <2.5)
MDMA: NEGATIVE ng/mL (ref <10)
Meprobamate: NEGATIVE ng/mL (ref <2.5)
Methadone: NEGATIVE ng/mL (ref <5.0)
Morphine: NEGATIVE ng/mL (ref <2.5)
Nicotine Metabolite: NEGATIVE ng/mL (ref <5.0)
Norhydrocodone: NEGATIVE ng/mL (ref <2.5)
Noroxycodone: 27.2 ng/mL — ABNORMAL HIGH (ref <2.5)
Opiates: POSITIVE ng/mL — AB (ref <2.5)
Oxycodone: 191.9 ng/mL — ABNORMAL HIGH (ref <2.5)
Oxymorphone: NEGATIVE ng/mL (ref <2.5)
Phencyclidine: NEGATIVE ng/mL (ref <10)
Tapentadol: NEGATIVE ng/mL (ref <5.0)
Tramadol: NEGATIVE ng/mL (ref <5.0)
Zolpidem: NEGATIVE ng/mL (ref <5.0)

## 2019-11-16 LAB — DRUG TOX ALC METAB W/CON, ORAL FLD

## 2019-11-18 ENCOUNTER — Encounter: Payer: Self-pay | Admitting: Infectious Disease

## 2019-11-18 ENCOUNTER — Ambulatory Visit (INDEPENDENT_AMBULATORY_CARE_PROVIDER_SITE_OTHER): Payer: No Typology Code available for payment source | Admitting: Infectious Disease

## 2019-11-18 ENCOUNTER — Other Ambulatory Visit: Payer: Self-pay

## 2019-11-18 VITALS — BP 105/74 | HR 90 | Temp 98.2°F

## 2019-11-18 DIAGNOSIS — M86151 Other acute osteomyelitis, right femur: Secondary | ICD-10-CM

## 2019-11-18 DIAGNOSIS — J189 Pneumonia, unspecified organism: Secondary | ICD-10-CM

## 2019-11-18 DIAGNOSIS — N4889 Other specified disorders of penis: Secondary | ICD-10-CM

## 2019-11-18 DIAGNOSIS — M8618 Other acute osteomyelitis, other site: Secondary | ICD-10-CM

## 2019-11-18 DIAGNOSIS — A0472 Enterocolitis due to Clostridium difficile, not specified as recurrent: Secondary | ICD-10-CM

## 2019-11-18 DIAGNOSIS — I82621 Acute embolism and thrombosis of deep veins of right upper extremity: Secondary | ICD-10-CM | POA: Diagnosis not present

## 2019-11-18 DIAGNOSIS — M86652 Other chronic osteomyelitis, left thigh: Secondary | ICD-10-CM

## 2019-11-18 HISTORY — DX: Other specified disorders of penis: N48.89
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
## 2019-11-19 ENCOUNTER — Telehealth: Payer: Self-pay | Admitting: Plastic Surgery

## 2019-11-19 NOTE — Telephone Encounter (Signed)
I have tried to call Laretta Bolster twice this afternoon to check in. Will try again tomorrow.

## 2019-11-19 NOTE — Telephone Encounter (Signed)
Christopher [name], Nurse Case Manager, called and lvm because Dr. Jena Gauss canceled Christopher [name] appt due to the MRI not being done. She said that she has the office notes from his most recent visit, but wanted to know if [name] could write an order for the CT of pelvis and thoracic spine on a prescription pad, sign it and send it to her via fax: [phone]. She said she can go ahead and get it scheduled instead of waiting on the hospital to do it. If you have any questions, call her at [phone].

## 2019-11-20 ENCOUNTER — Telehealth: Payer: Self-pay | Admitting: *Deleted

## 2019-11-20 NOTE — Telephone Encounter (Signed)
Oral swab drug screen was consistent for prescribed medications.  His oxycodone is present but there was insufficient quantity to perform the verification for fentanyl or alcohol.

## 2019-11-21 NOTE — Telephone Encounter (Signed)
Laretta Bolster, Nurse Manager picked up rx for CT with contrast yesterday per front desk staff.

## 2019-11-22 ENCOUNTER — Other Ambulatory Visit: Payer: Self-pay

## 2019-11-22 ENCOUNTER — Ambulatory Visit (HOSPITAL_COMMUNITY)
Admission: RE | Admit: 2019-11-22 | Discharge: 2019-11-22 | Disposition: A | Discharge status: Home / Self Care | Payer: Self-pay | Source: Ambulatory Visit | Attending: Infectious Disease | Admitting: Infectious Disease

## 2019-11-22 ENCOUNTER — Telehealth: Payer: Self-pay

## 2019-11-22 DIAGNOSIS — J189 Pneumonia, unspecified organism: Secondary | ICD-10-CM | POA: Insufficient documentation

## 2019-11-22 MED ORDER — OXYCODONE-ACETAMINOPHEN 5-325 MG PO TABS: 1.0000 | ORAL_TABLET | ORAL | 0 refills | Status: DC | PRN

## 2019-11-22 NOTE — Telephone Encounter (Signed)
Refilled Percocet  5/325 mg q4 hours #120-

## 2019-11-22 NOTE — Telephone Encounter (Signed)
Refill request Oxycodone/APAP 5/325 mg [facility] #12 left. Last  Filled 10/23/2019.

## 2019-11-25 ENCOUNTER — Telehealth: Payer: Self-pay

## 2019-11-25 NOTE — Telephone Encounter (Signed)
Clydie Braun from [facility] called to report that the cement from the patient's most recent surgery has migrated to the surface of his would and patient reports significant pain which he rates as 10/10.

## 2019-11-25 NOTE — Telephone Encounter (Signed)
Discussed with [facility] in regards to patient's left hip pain and what appears to be the bone cement moving more superficial within the wound bed.  Clydie Braun reported that they also spoke with orthopedic, Dr. Jena Gauss and he offered an appointment tomorrow to evaluate.  Patient is currently scheduled for an MRI on 12/03/2019 to further evaluate left hip.  I also discussed with [name], Mr. Ancrum wife this afternoon.  She reports that he is having a lot more pain mostly with ambulation or palpation.  He is otherwise feeling at his baseline.  Recommend calling with any other questions or concerns, we are waiting for MRI to further evaluate his hip.

## 2019-11-28 ENCOUNTER — Other Ambulatory Visit: Payer: Self-pay | Admitting: Physical Medicine and Rehabilitation

## 2019-11-28 ENCOUNTER — Telehealth: Payer: Self-pay | Admitting: *Deleted

## 2019-11-28 ENCOUNTER — Telehealth: Payer: Self-pay | Admitting: Family Medicine

## 2019-11-28 MED ORDER — APIXABAN 5 MG PO TABS: 5.0000 mg | ORAL_TABLET | Freq: Two times a day (BID) | ORAL | 0 refills | Status: DC

## 2019-11-28 MED ORDER — FENTANYL 50 MCG/HR TD PT72: 1.0000 | MEDICATED_PATCH | TRANSDERMAL | 0 refills | Status: DC

## 2019-11-28 NOTE — Telephone Encounter (Signed)
°  LAST APPOINTMENT DATE: 11/05/2019   NEXT APPOINTMENT DATE:@8 /24/2021  MEDICATION:Apixaban Starter Pack, 10mg  and 5mg , (ELIQUIS DVT/PE STARTER PACK)  PHARMACY[id number] [place], [place] - [address]  COMMENT: Patient only has two days left  **Let patient know to contact pharmacy at the end of the day to make sure medication is ready. **  ** Please notify patient to allow 48-72 hours to process**  **Encourage patient to contact the pharmacy for refills or they can request refills through [facility]**  CLINICAL FILLS OUT ALL BELOW:   LAST REFILL:  QTY:  REFILL DATE:    OTHER COMMENTS:    Okay for refill?  Please advise

## 2019-11-28 NOTE — Telephone Encounter (Signed)
Sent in eliquis 5mg  BID for him to take.  , MD [place] [facility] [facility]

## 2019-11-28 NOTE — Telephone Encounter (Signed)
Christopher [name] called for a refill on [name] patch 50 mcg.  He has one patch left to place tomorrow. Per PMP it was last filled 10/23/19 #15 and his next appt with Dr Berline Chough is 12/30/19.

## 2019-11-28 NOTE — Telephone Encounter (Signed)
Rx request 

## 2019-11-29 NOTE — Telephone Encounter (Signed)
Patient notified Rx sent in 

## 2019-12-03 ENCOUNTER — Ambulatory Visit (HOSPITAL_COMMUNITY)
Admission: RE | Admit: 2019-12-03 | Discharge: 2019-12-03 | Disposition: A | Discharge status: Home / Self Care | Payer: No Typology Code available for payment source | Source: Ambulatory Visit | Attending: Surgical | Admitting: Surgical

## 2019-12-03 ENCOUNTER — Other Ambulatory Visit: Payer: Self-pay

## 2019-12-03 DIAGNOSIS — M86652 Other chronic osteomyelitis, left thigh: Secondary | ICD-10-CM | POA: Insufficient documentation

## 2019-12-03 DIAGNOSIS — M8660 Other chronic osteomyelitis, unspecified site: Secondary | ICD-10-CM | POA: Insufficient documentation

## 2019-12-03 MED ORDER — GADOBUTROL 1 MMOL/ML IV SOLN
7.0000 mL | Freq: Once | INTRAVENOUS | Status: AC | PRN
  Administered 2019-12-03: 7 mL via INTRAVENOUS

## 2019-12-05 ENCOUNTER — Telehealth: Payer: Self-pay | Admitting: Physical Medicine and Rehabilitation

## 2019-12-05 ENCOUNTER — Ambulatory Visit (HOSPITAL_COMMUNITY): Admission: RE | Admit: 2019-12-05 | Payer: No Typology Code available for payment source | Source: Ambulatory Visit

## 2019-12-05 NOTE — Telephone Encounter (Signed)
Christopher [name] OT [facility] called and asked for verbal for OT 1 x week for month. She wants call back at [phone].   Approval given.

## 2019-12-09 ENCOUNTER — Telehealth: Payer: Self-pay | Admitting: *Deleted

## 2019-12-09 NOTE — Telephone Encounter (Signed)
Received orders from [facility] on (12/04/19) via of fax.  Requesting signature.  Given to the provider to sign.   Orders signed and faxed back to [facility].  Confirmation received and copy scanned into the chart.//AB/CMA

## 2019-12-10 ENCOUNTER — Other Ambulatory Visit (HOSPITAL_COMMUNITY)
Admission: RE | Admit: 2019-12-10 | Discharge: 2019-12-10 | Disposition: A | Discharge status: Home / Self Care | Payer: HRSA Program | Source: Ambulatory Visit | Attending: Sports Medicine | Admitting: Sports Medicine

## 2019-12-10 ENCOUNTER — Ambulatory Visit (INDEPENDENT_AMBULATORY_CARE_PROVIDER_SITE_OTHER): Payer: No Typology Code available for payment source | Admitting: Surgical

## 2019-12-10 ENCOUNTER — Telehealth: Payer: Self-pay | Admitting: *Deleted

## 2019-12-10 ENCOUNTER — Encounter: Payer: Self-pay | Admitting: Surgical

## 2019-12-10 ENCOUNTER — Other Ambulatory Visit: Payer: Self-pay

## 2019-12-10 ENCOUNTER — Other Ambulatory Visit (HOSPITAL_COMMUNITY): Payer: Self-pay

## 2019-12-10 VITALS — BP 133/87 | HR 77 | Temp 98.6°F | Wt 167.8 lb

## 2019-12-10 DIAGNOSIS — Z20822 Contact with and (suspected) exposure to covid-19: Secondary | ICD-10-CM | POA: Insufficient documentation

## 2019-12-10 DIAGNOSIS — L089 Local infection of the skin and subcutaneous tissue, unspecified: Secondary | ICD-10-CM

## 2019-12-10 DIAGNOSIS — Z01812 Encounter for preprocedural laboratory examination: Secondary | ICD-10-CM | POA: Diagnosis present

## 2019-12-10 DIAGNOSIS — T148XXA Other injury of unspecified body region, initial encounter: Secondary | ICD-10-CM

## 2019-12-10 DIAGNOSIS — S31829D Unspecified open wound of left buttock, subsequent encounter: Secondary | ICD-10-CM

## 2019-12-10 DIAGNOSIS — M86652 Other chronic osteomyelitis, left thigh: Secondary | ICD-10-CM

## 2019-12-10 LAB — SARS CORONAVIRUS 2 (TAT 6-24 HRS): SARS Coronavirus 2: NEGATIVE

## 2019-12-10 MED ORDER — ENOXAPARIN SODIUM 40 MG/0.4ML ~~LOC~~ SOLN: 40.0000 mg | SUBCUTANEOUS | 0 refills | Status: DC

## 2019-12-10 NOTE — Telephone Encounter (Signed)
Peter, HHPT, [facility] left a message asking for verbal orders to extend HHPT 1wk4. Medical record reviewed.   Verbal orders given per office protocol.
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
## 2019-12-10 NOTE — Telephone Encounter (Signed)
Christopher [name] from surgery center called and patient is going into surgery Thursday  He stopped taking apixaban (ELIQUIS) 5 MG TABS tablet until Friday, they gave him lovenox 40mg  for wed and thurs and he will be taking eliquis again on Friday and matthew just wanted to talk to someone regarding the medications

## 2019-12-11 ENCOUNTER — Telehealth: Payer: Self-pay | Admitting: Family Medicine

## 2019-12-11 ENCOUNTER — Encounter (HOSPITAL_COMMUNITY): Payer: Self-pay | Admitting: Vascular Surgery

## 2019-12-11 ENCOUNTER — Other Ambulatory Visit: Payer: Self-pay

## 2019-12-11 ENCOUNTER — Encounter (HOSPITAL_COMMUNITY): Payer: Self-pay | Admitting: Plastic Surgery

## 2019-12-11 ENCOUNTER — Telehealth: Payer: Self-pay
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
## 2019-12-11 NOTE — Telephone Encounter (Signed)
Verbal okay given for OT 1x1 week 4 wks Message left on [place] VM @ [facility].

## 2019-12-11 NOTE — Telephone Encounter (Signed)
Informed Dr.Matt patient will stop Eliquis 5 MG 2 days before surgery and restart after surgery Per Dr.Parker

## 2019-12-11 NOTE — Anesthesia Preprocedure Evaluation (Deleted)
Anesthesia Evaluation    Reviewed: Allergy & Precautions, Patient's Chart, lab work & pertinent test results, Unable to perform ROS - Chart review only  Airway        Dental   Pulmonary neg pulmonary ROS, former smoker,           Cardiovascular hypertension,      Neuro/Psych negative neurological ROS     GI/Hepatic Neg liver ROS, GERD  ,  Endo/Other  negative endocrine ROS  Renal/GU Renal disease     Musculoskeletal negative musculoskeletal ROS (+)   Abdominal   Peds  Hematology negative hematology ROS (+)   Anesthesia Other Findings   Reproductive/Obstetrics                                                             Anesthesia Evaluation  Patient identified by MRN, date of birth, ID band Patient awake    Reviewed: Allergy & Precautions, NPO status , Patient's Chart, lab work & pertinent test results  Airway Mallampati: II  TM Distance: >3 FB Neck ROM: Full    Dental  (+) Missing, Poor Dentition,    Pulmonary former smoker,  trach removed 11-16-2018, on vent from jan until may 2020   Pulmonary exam normal breath sounds clear to auscultation       Cardiovascular negative cardio ROS   Rhythm:Regular Rate:Tachycardia  ECG: SR, rate 73   Neuro/Psych PSYCHIATRIC DISORDERS Anxiety Depression negative neurological ROS     GI/Hepatic Neg liver ROS, GERD  Medicated and Controlled,  Endo/Other  negative endocrine ROS  Renal/GU negative Renal ROS     Musculoskeletal Chronic pain syndrome   Abdominal   Peds  Hematology negative hematology ROS (+)   Anesthesia Other Findings Infected Nonunion of Pelvis, Left side  Reproductive/Obstetrics                            Anesthesia Physical Anesthesia Plan  ASA: III  Anesthesia Plan: General   Post-op Pain Management:    Induction: Intravenous  PONV Risk Score and Plan: 2 and  Ondansetron, Dexamethasone and Treatment may vary due to age or medical condition  Airway Management Planned: Oral ETT  Additional Equipment:   Intra-op Plan:   Post-operative Plan: Extubation in OR  Informed Consent: I have reviewed the patients History and Physical, chart, labs and discussed the procedure including the risks, benefits and alternatives for the proposed anesthesia with the patient or authorized representative who has indicated his/her understanding and acceptance.     Dental advisory given  Plan Discussed with: CRNA  Anesthesia Plan Comments:        Anesthesia Quick Evaluation  Anesthesia Physical Anesthesia Plan  ASA: III  Anesthesia Plan: General   Post-op Pain Management:    Induction: Intravenous  PONV Risk Score and Plan: 3 and Ondansetron, Dexamethasone and Treatment may vary due to age or medical condition  Airway Management Planned: Oral ETT  Additional Equipment:   Intra-op Plan:   Post-operative Plan: Extubation in OR  Informed Consent:   Plan Discussed with:   Anesthesia Plan Comments: (PAT note written 12/11/2019 by Shonna Chock, PA-C. )       Anesthesia Quick Evaluation

## 2019-12-11 NOTE — Telephone Encounter (Signed)
Matt from [facility] plastic surgery is calling asking to speak with Dr.Parker or the nurse about the patient.

## 2019-12-11 NOTE — Telephone Encounter (Signed)
See Other Note

## 2019-12-11 NOTE — Progress Notes (Signed)
Spoke with wife [name] cell [phone]. [name] states patient does not have shortness of breath, fever, cough or chest pain.  PCP - Dr Jacquiline Doe Cardiologist - n/a Urology - Anne Fu, [place]  Chest x-ray - 11/22/19 EKG - 10/30/19 Stress Test - n/a ECHO - n/a Cardiac Cath - n/a  Blood Thinner Instructions:  Follow your surgeon's instructions on when to stop prior to surgery.  Eliquis last dose on 12/10/19 am dose.   Anesthesia review: Yes  STOP now taking any Aspirin (unless otherwise instructed by your surgeon), Aleve, Naproxen, Ibuprofen, Motrin, Advil, Goody's, BC's, all herbal medications, fish oil, and all vitamins.   Coronavirus Screening Covid test on 12/10/19 was negative.  Wife [name] verbalized understanding of instructions that were given via phone.

## 2019-12-12 ENCOUNTER — Encounter (HOSPITAL_COMMUNITY): Payer: Self-pay | Admitting: Student

## 2019-12-12 ENCOUNTER — Telehealth: Payer: Self-pay | Admitting: Plastic Surgery

## 2019-12-12 ENCOUNTER — Inpatient Hospital Stay (HOSPITAL_COMMUNITY)
Admission: RE | Admit: 2019-12-12 | Payer: No Typology Code available for payment source | Source: Home / Self Care | Admitting: Plastic Surgery

## 2019-12-12 ENCOUNTER — Ambulatory Visit: Payer: Self-pay | Admitting: Student

## 2019-12-12 DIAGNOSIS — M869 Osteomyelitis, unspecified: Secondary | ICD-10-CM

## 2019-12-12 HISTORY — DX: Other chronic osteomyelitis, unspecified site: M86.60

## 2019-12-12 HISTORY — DX: Essential (primary) hypertension: I10

## 2019-12-12 HISTORY — DX: Dependence on other enabling machines and devices: Z99.89

## 2019-12-12 HISTORY — DX: Depression, unspecified: F32.A

## 2019-12-12 HISTORY — DX: Dependence on wheelchair: Z99.3

## 2019-12-12 SURGERY — IRRIGATION AND DEBRIDEMENT WOUND
Anesthesia: General

## 2019-12-12 NOTE — Anesthesia Preprocedure Evaluation (Addendum)
Anesthesia Evaluation  Patient identified by MRN, date of birth, ID band Patient awake    Reviewed: Allergy & Precautions, H&P , NPO status , Patient's Chart, lab work & pertinent test results  Airway Mallampati: III  TM Distance: >3 FB Neck ROM: Full    Dental no notable dental hx. (+) Poor Dentition, Dental Advisory Given   Pulmonary neg pulmonary ROS, former smoker,    Pulmonary exam normal breath sounds clear to auscultation       Cardiovascular Exercise Tolerance: Good hypertension, Pt. on medications  Rhythm:Regular Rate:Normal     Neuro/Psych Anxiety Depression negative neurological ROS     GI/Hepatic Neg liver ROS, GERD  Medicated,  Endo/Other  negative endocrine ROS  Renal/GU negative Renal ROS  negative genitourinary   Musculoskeletal   Abdominal   Peds  Hematology negative hematology ROS (+)   Anesthesia Other Findings   Reproductive/Obstetrics negative OB ROS                            Anesthesia Physical Anesthesia Plan  ASA: III  Anesthesia Plan: General   Post-op Pain Management:    Induction: Intravenous  PONV Risk Score and Plan: 3 and Ondansetron, Dexamethasone and Midazolam  Airway Management Planned: Oral ETT and Video Laryngoscope Planned  Additional Equipment:   Intra-op Plan:   Post-operative Plan: Extubation in OR  Informed Consent: I have reviewed the patients History and Physical, chart, labs and discussed the procedure including the risks, benefits and alternatives for the proposed anesthesia with the patient or authorized representative who has indicated his/her understanding and acceptance.     Dental advisory given  Plan Discussed with: CRNA  Anesthesia Plan Comments:        Anesthesia Quick Evaluation

## 2019-12-12 NOTE — Telephone Encounter (Signed)
Christopher [name] called regarding husband's sx that got cancelled this morning. She said he is in a lot of pain and wanted to know whether she should take him to the ER to expedite the surgery or what. She is not sure of what to do next. I told her more than likely Shayna would need to coordinate again with Dr. Jena Gauss, but I would request a call from one of the providers to help her decided what she should do in the meantime. She would like to speak with [facility] or [place] about it. Please call her at [phone]

## 2019-12-12 NOTE — Telephone Encounter (Signed)
Spoke with [employer]. Informed her we discussed with Dr. Jena Gauss and he is able to plan to take patient to OR tomorrow AM.  Discussed not taking eliquis. He does not need a bridge w/ lovenox. Discussed arrival time to OR tomorrow at 5am for 7:30 case. Informed her I would update Dr. Jena Gauss as well.   In regards to pain control, recommend going to ED if pain is uncontrollable on home meds. Update [place] if they do.

## 2019-12-12 NOTE — Progress Notes (Signed)
Patient's wife called and stated transportation has not arrived and they have not been able to reach anyone and will need to reschedule surgery.  Instructed wife to contact Dr. Kittie Plater office to reschedule.  Wife verbalized understanding.    OR desk notified, Dr. Ulice Bold and Dr. Jena Gauss notified.

## 2019-12-12 NOTE — Progress Notes (Signed)
Mr. Shin wants wife, [name] to take call.  I encouraged [name] to ask in admitting for form for Mr,. Brownfeild to sign giving her permission to talk with [facility] staff.  [name] reported that Mr. Hewitt took last dose of Eliquis was the am dose on 12/10/19.  [name] will call the nurse case manager for workman's comp and give her the time Mr. Heffington needs to arrive in am, nurse will arrange transportation.

## 2019-12-13 ENCOUNTER — Emergency Department (HOSPITAL_COMMUNITY): Payer: Self-pay | Admitting: Certified Registered Nurse Anesthetist

## 2019-12-13 ENCOUNTER — Encounter (HOSPITAL_COMMUNITY): Admission: EM | Disposition: A | Discharge status: Home / Self Care | Payer: Self-pay | Source: Home / Self Care

## 2019-12-13 ENCOUNTER — Ambulatory Visit (HOSPITAL_COMMUNITY)
Admission: RE | Admit: 2019-12-13 | Payer: No Typology Code available for payment source | Source: Home / Self Care | Admitting: Student

## 2019-12-13 ENCOUNTER — Other Ambulatory Visit: Payer: Self-pay

## 2019-12-13 ENCOUNTER — Encounter (HOSPITAL_COMMUNITY): Payer: Self-pay | Admitting: *Deleted

## 2019-12-13 ENCOUNTER — Ambulatory Visit (HOSPITAL_COMMUNITY)
Admission: EM | Admit: 2019-12-13 | Discharge: 2019-12-13 | Disposition: A | Discharge status: Home / Self Care | Payer: Self-pay | Attending: Plastic Surgery | Admitting: Plastic Surgery

## 2019-12-13 DIAGNOSIS — Y838 Other surgical procedures as the cause of abnormal reaction of the patient, or of later complication, without mention of misadventure at the time of the procedure: Secondary | ICD-10-CM | POA: Insufficient documentation

## 2019-12-13 DIAGNOSIS — Z7901 Long term (current) use of anticoagulants: Secondary | ICD-10-CM | POA: Insufficient documentation

## 2019-12-13 DIAGNOSIS — K219 Gastro-esophageal reflux disease without esophagitis: Secondary | ICD-10-CM | POA: Insufficient documentation

## 2019-12-13 DIAGNOSIS — Z993 Dependence on wheelchair: Secondary | ICD-10-CM | POA: Insufficient documentation

## 2019-12-13 DIAGNOSIS — F329 Major depressive disorder, single episode, unspecified: Secondary | ICD-10-CM | POA: Insufficient documentation

## 2019-12-13 DIAGNOSIS — G894 Chronic pain syndrome: Secondary | ICD-10-CM | POA: Insufficient documentation

## 2019-12-13 DIAGNOSIS — M869 Osteomyelitis, unspecified: Secondary | ICD-10-CM

## 2019-12-13 DIAGNOSIS — M8668 Other chronic osteomyelitis, other site: Secondary | ICD-10-CM | POA: Insufficient documentation

## 2019-12-13 DIAGNOSIS — I10 Essential (primary) hypertension: Secondary | ICD-10-CM | POA: Insufficient documentation

## 2019-12-13 DIAGNOSIS — Z87891 Personal history of nicotine dependence: Secondary | ICD-10-CM | POA: Insufficient documentation

## 2019-12-13 DIAGNOSIS — Z86718 Personal history of other venous thrombosis and embolism: Secondary | ICD-10-CM | POA: Insufficient documentation

## 2019-12-13 DIAGNOSIS — Z79899 Other long term (current) drug therapy: Secondary | ICD-10-CM | POA: Insufficient documentation

## 2019-12-13 DIAGNOSIS — T8469XA Infection and inflammatory reaction due to internal fixation device of other site, initial encounter: Secondary | ICD-10-CM | POA: Insufficient documentation

## 2019-12-13 DIAGNOSIS — S31000D Unspecified open wound of lower back and pelvis without penetration into retroperitoneum, subsequent encounter: Secondary | ICD-10-CM

## 2019-12-13 DIAGNOSIS — F419 Anxiety disorder, unspecified: Secondary | ICD-10-CM | POA: Insufficient documentation

## 2019-12-13 DIAGNOSIS — S31829D Unspecified open wound of left buttock, subsequent encounter: Secondary | ICD-10-CM

## 2019-12-13 SURGERY — IRRIGATION AND DEBRIDEMENT POSTERIOR HIP
Anesthesia: General | Site: Hip | Laterality: Left

## 2019-12-13 MED ORDER — ALBUTEROL SULFATE (2.5 MG/3ML) 0.083% IN NEBU
2.5000 mg | INHALATION_SOLUTION | Freq: Once | RESPIRATORY_TRACT | Status: AC
  Administered 2019-12-13: 2.5 mg via RESPIRATORY_TRACT

## 2019-12-13 MED ORDER — LIDOCAINE 2% (20 MG/ML) 5 ML SYRINGE
INTRAMUSCULAR | Status: DC | PRN
  Administered 2019-12-13: 60 mg via INTRAVENOUS

## 2019-12-13 MED ORDER — OXYCODONE-ACETAMINOPHEN 5-325 MG PO TABS
ORAL_TABLET | ORAL | Status: AC
  Filled 2019-12-13: qty 1

## 2019-12-13 MED ORDER — VANCOMYCIN HCL 1000 MG IV SOLR
INTRAVENOUS | Status: DC | PRN
  Administered 2019-12-13: 1000 mg

## 2019-12-13 MED ORDER — ROCURONIUM BROMIDE 10 MG/ML (PF) SYRINGE
PREFILLED_SYRINGE | INTRAVENOUS | Status: AC
  Filled 2019-12-13: qty 10

## 2019-12-13 MED ORDER — CHLORHEXIDINE GLUCONATE 0.12 % MT SOLN
OROMUCOSAL | Status: AC
  Administered 2019-12-13: 15 mL
  Filled 2019-12-13: qty 15

## 2019-12-13 MED ORDER — CIPROFLOXACIN IN D5W 400 MG/200ML IV SOLN
INTRAVENOUS | Status: AC
  Filled 2019-12-13: qty 200

## 2019-12-13 MED ORDER — CHLORHEXIDINE GLUCONATE CLOTH 2 % EX PADS: 6.0000 | MEDICATED_PAD | Freq: Once | CUTANEOUS | Status: DC

## 2019-12-13 MED ORDER — ONDANSETRON HCL 4 MG/2ML IJ SOLN
INTRAMUSCULAR | Status: AC
  Filled 2019-12-13: qty 2

## 2019-12-13 MED ORDER — MIDAZOLAM HCL 5 MG/5ML IJ SOLN
INTRAMUSCULAR | Status: DC | PRN
  Administered 2019-12-13: 2 mg via INTRAVENOUS

## 2019-12-13 MED ORDER — 0.9 % SODIUM CHLORIDE (POUR BTL) OPTIME
TOPICAL | Status: DC | PRN
  Administered 2019-12-13: 1000 mL

## 2019-12-13 MED ORDER — DEXAMETHASONE SODIUM PHOSPHATE 10 MG/ML IJ SOLN
INTRAMUSCULAR | Status: DC | PRN
  Administered 2019-12-13: 10 mg via INTRAVENOUS

## 2019-12-13 MED ORDER — MIDAZOLAM HCL 2 MG/2ML IJ SOLN
INTRAMUSCULAR | Status: AC
  Filled 2019-12-13: qty 2

## 2019-12-13 MED ORDER — ALBUTEROL SULFATE (2.5 MG/3ML) 0.083% IN NEBU
INHALATION_SOLUTION | RESPIRATORY_TRACT | Status: AC
  Filled 2019-12-13: qty 3

## 2019-12-13 MED ORDER — DEXAMETHASONE SODIUM PHOSPHATE 10 MG/ML IJ SOLN
INTRAMUSCULAR | Status: AC
  Filled 2019-12-13: qty 1

## 2019-12-13 MED ORDER — SURGILUBE EX GEL
CUTANEOUS | Status: DC | PRN
  Administered 2019-12-13: 1 via TOPICAL

## 2019-12-13 MED ORDER — TOBRAMYCIN SULFATE 1.2 G IJ SOLR
INTRAMUSCULAR | Status: AC
  Filled 2019-12-13: qty 1.2

## 2019-12-13 MED ORDER — PROPOFOL 10 MG/ML IV BOLUS
INTRAVENOUS | Status: DC | PRN
  Administered 2019-12-13: 130 mg via INTRAVENOUS

## 2019-12-13 MED ORDER — SUGAMMADEX SODIUM 200 MG/2ML IV SOLN
INTRAVENOUS | Status: DC | PRN
Start: 2019-12-13 — End: 2019-12-13
  Administered 2019-12-13: 200 mg via INTRAVENOUS

## 2019-12-13 MED ORDER — LACTATED RINGERS IV SOLN: INTRAVENOUS | Status: DC | PRN

## 2019-12-13 MED ORDER — LIDOCAINE 2% (20 MG/ML) 5 ML SYRINGE
INTRAMUSCULAR | Status: AC
  Filled 2019-12-13: qty 5

## 2019-12-13 MED ORDER — TOBRAMYCIN SULFATE 1.2 G IJ SOLR
INTRAMUSCULAR | Status: DC | PRN
  Administered 2019-12-13: 1.2 g

## 2019-12-13 MED ORDER — HYDROMORPHONE HCL 1 MG/ML IJ SOLN: 0.2500 mg | INTRAMUSCULAR | Status: DC | PRN

## 2019-12-13 MED ORDER — VANCOMYCIN HCL 1000 MG IV SOLR
INTRAVENOUS | Status: AC
  Filled 2019-12-13: qty 1000

## 2019-12-13 MED ORDER — ACETAMINOPHEN 500 MG PO TABS
1000.0000 mg | ORAL_TABLET | Freq: Once | ORAL | Status: AC
  Administered 2019-12-13 (×2): 1000 mg via ORAL

## 2019-12-13 MED ORDER — SODIUM CHLORIDE 0.9 % IR SOLN
Status: DC | PRN
  Administered 2019-12-13: 3000 mL

## 2019-12-13 MED ORDER — PHENYLEPHRINE 40 MCG/ML (10ML) SYRINGE FOR IV PUSH (FOR BLOOD PRESSURE SUPPORT)
PREFILLED_SYRINGE | INTRAVENOUS | Status: DC | PRN
  Administered 2019-12-13 (×2): 40 ug via INTRAVENOUS
  Administered 2019-12-13: 80 ug via INTRAVENOUS
  Administered 2019-12-13: 40 ug via INTRAVENOUS

## 2019-12-13 MED ORDER — CIPROFLOXACIN IN D5W 400 MG/200ML IV SOLN
400.0000 mg | INTRAVENOUS | Status: AC
  Administered 2019-12-13: 400 mg via INTRAVENOUS

## 2019-12-13 MED ORDER — OXYCODONE-ACETAMINOPHEN 5-325 MG PO TABS
1.0000 | ORAL_TABLET | Freq: Once | ORAL | Status: AC
  Administered 2019-12-13: 1 via ORAL

## 2019-12-13 MED ORDER — ACETAMINOPHEN 500 MG PO TABS
ORAL_TABLET | ORAL | Status: AC
  Filled 2019-12-13: qty 2

## 2019-12-13 MED ORDER — PROPOFOL 10 MG/ML IV BOLUS
INTRAVENOUS | Status: AC
  Filled 2019-12-13: qty 40

## 2019-12-13 MED ORDER — FENTANYL CITRATE (PF) 250 MCG/5ML IJ SOLN
INTRAMUSCULAR | Status: AC
  Filled 2019-12-13: qty 5

## 2019-12-13 MED ORDER — ROCURONIUM BROMIDE 10 MG/ML (PF) SYRINGE
PREFILLED_SYRINGE | INTRAVENOUS | Status: DC | PRN
  Administered 2019-12-13: 40 mg via INTRAVENOUS

## 2019-12-13 MED ORDER — ONDANSETRON HCL 4 MG/2ML IJ SOLN
INTRAMUSCULAR | Status: DC | PRN
  Administered 2019-12-13: 4 mg via INTRAVENOUS

## 2019-12-13 MED ORDER — FENTANYL CITRATE (PF) 100 MCG/2ML IJ SOLN
INTRAMUSCULAR | Status: DC | PRN
  Administered 2019-12-13 (×2): 50 ug via INTRAVENOUS

## 2019-12-13 SURGICAL SUPPLY — 52 items
BNDG COHESIVE 4X5 TAN STRL (GAUZE/BANDAGES/DRESSINGS) ×2 IMPLANT
BNDG GAUZE ELAST 4 BULKY (GAUZE/BANDAGES/DRESSINGS) ×4 IMPLANT
BRUSH SCRUB EZ PLAIN DRY (MISCELLANEOUS) ×4 IMPLANT
CHLORAPREP W/TINT 26 (MISCELLANEOUS) ×2 IMPLANT
COVER MAYO STAND STRL (DRAPES) ×2 IMPLANT
COVER SURGICAL LIGHT HANDLE (MISCELLANEOUS) ×4 IMPLANT
COVER WAND RF STERILE (DRAPES) ×2 IMPLANT
DRAPE ORTHO SPLIT 77X108 STRL (DRAPES) ×2
DRAPE SURG 17X23 STRL (DRAPES) ×2 IMPLANT
DRAPE SURG ORHT 6 SPLT 77X108 (DRAPES) ×1 IMPLANT
DRAPE U-SHAPE 47X51 STRL (DRAPES) ×2 IMPLANT
DRSG ADAPTIC 3X8 NADH LF (GAUZE/BANDAGES/DRESSINGS) ×2 IMPLANT
DRSG CUTIMED SORBACT 7X9 (GAUZE/BANDAGES/DRESSINGS) ×1 IMPLANT
ELECT REM PT RETURN 9FT ADLT (ELECTROSURGICAL)
ELECTRODE REM PT RTRN 9FT ADLT (ELECTROSURGICAL) IMPLANT
EVACUATOR 1/8 PVC DRAIN (DRAIN) IMPLANT
GAUZE SPONGE 4X4 12PLY STRL (GAUZE/BANDAGES/DRESSINGS) ×2 IMPLANT
GAUZE SPONGE 4X4 12PLY STRL LF (GAUZE/BANDAGES/DRESSINGS) ×1 IMPLANT
GLOVE BIO SURGEON STRL SZ 6.5 (GLOVE) ×6 IMPLANT
GLOVE BIO SURGEON STRL SZ7.5 (GLOVE) ×8 IMPLANT
GLOVE BIOGEL PI IND STRL 6.5 (GLOVE) ×1 IMPLANT
GLOVE BIOGEL PI IND STRL 7.5 (GLOVE) ×1 IMPLANT
GLOVE BIOGEL PI INDICATOR 6.5 (GLOVE) ×1
GLOVE BIOGEL PI INDICATOR 7.5 (GLOVE) ×1
GOWN STRL REUS W/ TWL LRG LVL3 (GOWN DISPOSABLE) ×2 IMPLANT
GOWN STRL REUS W/TWL LRG LVL3 (GOWN DISPOSABLE) ×4
HANDPIECE INTERPULSE COAX TIP (DISPOSABLE)
KIT BASIN OR (CUSTOM PROCEDURE TRAY) ×2 IMPLANT
KIT TURNOVER KIT B (KITS) ×2 IMPLANT
MANIFOLD NEPTUNE II (INSTRUMENTS) ×2 IMPLANT
MATRIX WOUND 3-LAYER 5X5 (Tissue) ×1 IMPLANT
MICROMATRIX 1000MG (Tissue) ×2 IMPLANT
NS IRRIG 1000ML POUR BTL (IV SOLUTION) ×2 IMPLANT
PACK ORTHO EXTREMITY (CUSTOM PROCEDURE TRAY) ×2 IMPLANT
PAD ABD 8X10 STRL (GAUZE/BANDAGES/DRESSINGS) ×2 IMPLANT
PAD ARMBOARD 7.5X6 YLW CONV (MISCELLANEOUS) ×4 IMPLANT
PADDING CAST COTTON 6X4 STRL (CAST SUPPLIES) ×2 IMPLANT
SET HNDPC FAN SPRY TIP SCT (DISPOSABLE) IMPLANT
SOLUTION PARTIC MCRMTRX 1000MG (Tissue) IMPLANT
SPONGE LAP 18X18 RF (DISPOSABLE) ×2 IMPLANT
SUT ETHILON 2 0 FS 18 (SUTURE) ×4 IMPLANT
SUT ETHILON 3 0 PS 1 (SUTURE) ×4 IMPLANT
SUT MON AB 2-0 CT1 36 (SUTURE) ×2 IMPLANT
SUT PDS AB 0 CT 36 (SUTURE) IMPLANT
SWAB CULTURE ESWAB REG 1ML (MISCELLANEOUS) IMPLANT
TAPE CLOTH SURG 6X10 WHT LF (GAUZE/BANDAGES/DRESSINGS) ×1 IMPLANT
TOWEL GREEN STERILE (TOWEL DISPOSABLE) ×4 IMPLANT
TOWEL GREEN STERILE FF (TOWEL DISPOSABLE) ×2 IMPLANT
TUBE CONNECTING 12X1/4 (SUCTIONS) ×2 IMPLANT
UNDERPAD 30X36 HEAVY ABSORB (UNDERPADS AND DIAPERS) ×2 IMPLANT
WATER STERILE IRR 1000ML POUR (IV SOLUTION) ×2 IMPLANT
YANKAUER SUCT BULB TIP NO VENT (SUCTIONS) ×2 IMPLANT

## 2019-12-13 NOTE — Anesthesia Procedure Notes (Signed)
Procedure Name: Intubation Date/Time: 12/13/2019 7:39 AM Performed by: Jed Limerick, CRNA Pre-anesthesia Checklist: Patient identified, Emergency Drugs available, Suction available and Patient being monitored Patient Re-evaluated:Patient Re-evaluated prior to induction Oxygen Delivery Method: Circle System Utilized Preoxygenation: Pre-oxygenation with 100% oxygen Induction Type: IV induction Ventilation: Two handed mask ventilation required and Oral airway inserted - appropriate to patient size Laryngoscope Size: Glidescope and 4 Grade View: Grade I Tube type: Oral Tube size: 7.5 mm Number of attempts: 1 Airway Equipment and Method: Oral airway,  Video-laryngoscopy and Rigid stylet Placement Confirmation: ETT inserted through vocal cords under direct vision,  positive ETCO2 and breath sounds checked- equal and bilateral Secured at: 22 cm Tube secured with: Tape Dental Injury: Teeth and Oropharynx as per pre-operative assessment  Difficulty Due To: Difficulty was anticipated Comments: Elective glidescope intubation d/t previously documented difficult intubations. Hx of previous trach.

## 2019-12-13 NOTE — Transfer of Care (Signed)
Immediate Anesthesia Transfer of Care Note  Patient: Christopher [name]  Procedure(s) Performed: IRRIGATION AND DEBRIDEMENT POSTERIOR HIP/PELVIS (Left Hip)  Patient Location: PACU  Anesthesia Type:General  Level of Consciousness: drowsy  Airway & Oxygen Therapy: Patient Spontanous Breathing and Patient connected to face mask oxygen  Post-op Assessment: Report given to RN and Post -op Vital signs reviewed and stable  Post vital signs: Reviewed and stable  Last Vitals:  Vitals Value Taken Time  BP 161/89 12/13/19 0835  Temp    Pulse 68 12/13/19 0836  Resp 12 12/13/19 0836  SpO2 100 % 12/13/19 0836  Vitals shown include unvalidated device data.  Last Pain:  Vitals:   12/13/19 0703  TempSrc:   PainSc: 5       Patients Stated Pain Goal: 3 (12/13/19 0703)  Complications: No complications documented.

## 2019-12-13 NOTE — ED Notes (Addendum)
Pt states he only called 911 to come to [facility] for his surgical procedure, pt continues to refuse to transfer into [facility] for triage in the ED. Pt states he does not need to be seen in the ER he only needs to be taken to the OR. Attempts made to explain to pt 911 is for emergency transports not for routine transports for appointments.  Pt reports he was told by SW to call 911 instead of waiting for transport.

## 2019-12-13 NOTE — Anesthesia Postprocedure Evaluation (Signed)
Anesthesia Post Note  Patient: Christopher [name]  Procedure(s) Performed: IRRIGATION AND DEBRIDEMENT POSTERIOR HIP/PELVIS (Left Hip)     Patient location during evaluation: PACU Anesthesia Type: General Level of consciousness: awake and alert Pain management: pain level controlled Vital Signs Assessment: post-procedure vital signs reviewed and stable Respiratory status: spontaneous breathing, nonlabored ventilation and respiratory function stable Cardiovascular status: blood pressure returned to baseline and stable Postop Assessment: no apparent nausea or vomiting Anesthetic complications: no   No complications documented.  Last Vitals:  Vitals:   12/13/19 0915 12/13/19 0916  BP: 133/71   Pulse: 73 64  Resp: 13 13  Temp: 36.6 C   SpO2: 91% 95%    Last Pain:  Vitals:   12/13/19 0915  TempSrc:   PainSc: 0-No pain                 Torell Minder,W. EDMOND

## 2019-12-13 NOTE — Op Note (Signed)
Orthopaedic Surgery Operative Note (CSN: [id number] ) Date of Surgery: 12/13/2019  Admit Date: 12/13/2019   Diagnoses: Pre-Op Diagnoses: Chronic infected nonunion of pelvis  Post-Op Diagnosis: Same  Procedures: 1. CPT 27070-Debridement of superficial wound and pelvic osteomyelitis 2. CPT 11982-Removal of posterior pelvis antibiotic cement spacer 3. CPT 15777-Preparation and placement of Acell dermal matrix  Surgeons : Primary: Roby Lofts, MD  Assistant: Dr. Foster Simpson, DO  Location: OR 3   Anesthesia:General  Antibiotics: 1 gm vanocomycin powder and 1.2 gm tobramycin powder   Tourniquet time:None    Estimated Blood Loss:10 mL  Complications:None   Specimens: ID Type Source Tests Collected by Time Destination  A : posterior left pelvis tissue Tissue Soft Tissue, Other AEROBIC/ANAEROBIC CULTURE (SURGICAL/DEEP WOUND) Roby Lofts, MD 12/13/2019 0759      Implants: Implant Name Type Inv. Item Serial No. Manufacturer Lot No. LRB No. Used Action  MICROMATRIX 1000MG  - Tissue MICROMATRIX 1000MG  [id number] ACELL Left 1 Implanted  WOUND MATRIX 3-LAYER 5X5 - [id number] Tissue WOUND MATRIX 3-LAYER 5X5 [id number] ACELL [id number] Left 1 Implanted     Indications for Surgery: 54 year old male with a complex history of his pelvis.  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  He was subsequently doing well until recently the cement spacer had migrated to where it was protruding through the wound and not allowing wound healing.  He also had some increased drainage from his wound secondary to this and possible recurrence of his osteomyelitis.  I recommended proceeding to the operating room for debridement and removal of antibiotic cement spacer.  We also plan in coordination with Dr. 40 to place a cell dermal matrix to assist with granulation tissue for the wound.  Risks and benefits  were discussed with the patient and his wife.  They agreed to proceed with surgery and consent was obtained.  Operative Findings: 1.  Successful removal of antibiotic cement spacer with debridement and cultures of posterior pelvic wound and posterior sacrum and ilium 2.  Placement of ACell dermal matrix to the wound after irrigation and debridement  Procedure: The patient was identified in the preoperative holding area. Consent was confirmed with the patient and their family and all questions were answered. The operative extremity was marked after confirmation with the patient. he was then brought back to the operating room by our anesthesia colleagues.  He was placed under general anesthetic and carefully transferred over to a radiolucent flat top table.  The patient was carefully positioned in the lateral decubitus position with his left side up.  The posterior pelvis was then prepped and draped in usual sterile fashion.  A timeout was performed to verify the patient, the procedure, and the extremity.  Preoperative antibiotics were dosed.  The antibiotic cement spacer that had extruded through the wound was removed with a Kocher.  The wound then did not appear significantly infected there was some fibrinous tissue that was debrided with a curette and rongeur.  I did use a curette to debride the sacrum that was exposed as well as the posterior ilium.  I did take some cultures of the soft tissue and bone to see if it grows out any bacteria.  Once a thorough debridement was performed at the area that was visualized I then used low pressure pulsatile lavage to thoroughly irrigate the wound.  Total of 3 L was used.  I then changed gloves and instruments and placed  1 g of vancomycin powder 1.2 g of tobramycin powder deep into the pelvic wound.  I then placed 1 g of ACell dermal matrix into the wound.  I placed a Acell sheet into the wound as well.  I then placed sorbac sheet over the wound and tacked this  down to the skin edges using Vicryl suture.  A dressing consisting of K-Y jelly, 4 x 4's and ABD pad was placed over the wound and held with tape.  Patient then was placed a supine and woken from anesthesia and taken to the PACU stable condition.  Post Op Plan/Instructions: Patient will be nonweightbearing to left lower extremity to allow some healing of the wound and limit motion of the nonunion site.  He will follow up with infectious disease next week as well as Dr. Ulice Bold.  Will be discharged home from PACU.  I was present and performed the entire surgery.   Truitt Merle, MD [facility]

## 2019-12-13 NOTE — H&P (Signed)
[facility] (OTS) Consult   Patient ID: ATHANASIUS KESLING MRN: [id number] DOB/AGE: [birth year] 54 y.o.  Reason for Surgery: Debridement of pelvis with removal of antibiotic spacer  HPI: BRAINARD HIGHFILL is an 54 y.o. male who presents for surgery for removal of antibiotic spacer with debridement of his wound with possible placement of ACell.  Patient has a complicated history of his pelvic ring injury.  He has chronic osteomyelitis of his pelvis and sacrum.  We did a debridement with excision of multiple infected bone pieces with placement of antibiotic cement spacer in April of this year.  He subsequently has had increased pain with a migration of his antibiotic cement spacer through his wound.  Due to the inability to close the wound secondary to the spacer in place we will proceed to remove this and debride the wound.  Patient has been off of antibiotics since early June.  Past Medical History:  Diagnosis Date  . Acute on chronic respiratory failure with hypoxia ([place]) 06/2018   trach removed 11-16-2018, on vent from jan until may 2020 - uses albuterol prn  . Anxiety   . Bacteremia due to Pseudomonas 06/2018  . Chronic osteomyelitis ([place])   . Chronic pain syndrome   . Clostridium difficile colitis 10/30/2019   tx with abx   . Depression   . DVT (deep venous thrombosis) ([place]) 2020   right brachial post PICC line  . History of blood transfusion 06/2018  . History of kidney stones   . Hypertension    norvasc d/c by pcp on 11/05/19  . Multiple traumatic injuries   . Penile pain 11/18/2019  . Pneumonia 11/2009   2020 x 2  . Gilford Rile as ambulation aid   . Wheelchair bound    electric  . Wound discharge    left hip wound with bloody/clear drainage change dressing q day surgilube with gauze, between legs wound using calcium algenate pad bid    Past Surgical History:  Procedure Laterality Date  . APPLICATION OF A-CELL OF BACK N/A 08/06/2018   Procedure: Application Of A-Cell  Of Back;  Surgeon: [name], DO;  Location: [place];  Service: Plastics;  Laterality: N/A;  . APPLICATION OF A-CELL OF EXTREMITY Left 08/06/2018   Procedure: Application Of A-Cell Of Extremity;  Surgeon: [name], DO;  Location: [place];  Service: Plastics;  Laterality: Left;  . APPLICATION OF A-CELL OF EXTREMITY Left 09/18/2019   Procedure: APPLICATION OF A-CELL OF EXTREMITY;  Surgeon: [name], DO;  Location: [place];  Service: Plastics;  Laterality: Left;  . APPLICATION OF WOUND VAC  07/12/2018   Procedure: Application Of Wound Vac to the Left Thigh and Scrotum.;  Surgeon: Shona Needles, MD;  Location: [place];  Service: Orthopedics;;  . APPLICATION OF WOUND VAC  07/10/2018   Procedure: Application Of Wound Vac;  Surgeon: Clovis Riley, MD;  Location: [place];  Service: General;;  . COLON SURGERY  2020   colostomy  . COLOSTOMY N/A 07/23/2018   Procedure: COLOSTOMY;  Surgeon: Georganna Skeans, MD;  Location: [place];  Service: General;  Laterality: N/A;  . CYSTOSCOPY W/ URETERAL STENT PLACEMENT N/A 07/15/2018   Procedure: RETROGRADE URETHROGRAM;  Surgeon: Franchot Gallo, MD;  Location: [place];  Service: Urology;  Laterality: N/A;  . CYSTOSCOPY WITH LITHOLAPAXY N/A 05/06/2019   Procedure: CYSTOSCOPY BASKET BLADDER STONE EXTRACTION;  Surgeon: Cleon Gustin, MD;  Location: [facility];  Service: Urology;  Laterality: N/A;  30 MINS  . CYSTOSTOMY N/A 05/06/2019   Procedure: REPLACEMENT OF SUPRAPUBIC CATHETER;  Surgeon: Cleon Gustin, MD;  Location: [facility];  Service: Urology;  Laterality: N/A;  . DEBRIDEMENT AND CLOSURE WOUND Left 03/04/2019   Procedure: Excision of hip wound with placement of Acell;  Surgeon: [name], DO;  Location: [place];  Service: Plastics;  Laterality: Left;  . ESOPHAGOGASTRODUODENOSCOPY N/A 08/14/2018   Procedure: ESOPHAGOGASTRODUODENOSCOPY (EGD);  Surgeon: Georganna Skeans, MD;  Location: [place];  Service: General;  Laterality: N/A;  bedside  . FACIAL RECONSTRUCTION SURGERY     X 2--once as a teenager and second time in his 63's  . HARDWARE REMOVAL Left 03/04/2019   Procedure: Left Hip Hardware Removal;  Surgeon: Shona Needles, MD;  Location: [place];  Service: Orthopedics;  Laterality: Left;  . HIP PINNING,CANNULATED Left 07/12/2018   Procedure: CANNULATED HIP PINNING;  Surgeon: Shona Needles, MD;  Location: [place];  Service: Orthopedics;  Laterality: Left;  . HIP SURGERY    . HOLMIUM LASER APPLICATION Right 09/16/6254   Procedure: HOLMIUM LASER APPLICATION;  Surgeon: Cleon Gustin, MD;  Location: [facility];  Service: Urology;  Laterality: Right;  . I & D EXTREMITY Left 07/25/2018   Procedure: Debridement of buttock, scrotum and left leg, placement of acell and vac;  Surgeon: [name], DO;  Location: [place];  Service: Plastics;  Laterality: Left;  . I & D EXTREMITY N/A 08/06/2018   Procedure: Debridement of buttock, scrotum and left leg;  Surgeon: [name], DO;  Location: [place];  Service: Plastics;  Laterality: N/A;  . I & D EXTREMITY N/A 08/13/2018   Procedure: Debridement of buttock, scrotum and left leg, placement of acell and vac;  Surgeon: [name], DO;  Location: [place];  Service: Plastics;  Laterality: N/A;  90 min, please  . INCISION AND DRAINAGE HIP Left 09/18/2019   Procedure: IRRIGATION AND DEBRIDEMENT HIP/ PELVIS WITH WOUND VAC PLACEMENT;  Surgeon: Shona Needles, MD;  Location: [place];  Service: Orthopedics;  Laterality: Left;  . INCISION AND DRAINAGE OF WOUND N/A 07/18/2018   Procedure: Debridement of left leg, buttocks and scrotal wound with placement of acell and Flexiseal;  Surgeon: [name], DO;  Location: [place];  Service: Plastics;  Laterality: N/A;  . INCISION AND DRAINAGE OF WOUND Left 08/29/2018   Procedure: Debridement of buttock, scrotum and left leg, placement of acell and vac;  Surgeon:  [name], DO;  Location: [place];  Service: Plastics;  Laterality: Left;  75 min, please  . INCISION AND DRAINAGE OF WOUND Bilateral 10/23/2018   Procedure: DEBRIDEMENT OF BUTTOCK,SCROTUM, AND LEG WOUNDS WITH PLACEMENT OF ACELL- BILATERAL 90 MIN;  Surgeon: [name], DO;  Location: [place];  Service: Plastics;  Laterality: Bilateral;  . IR ANGIOGRAM PELVIS SELECTIVE OR SUPRASELECTIVE  07/10/2018  . IR ANGIOGRAM PELVIS SELECTIVE OR SUPRASELECTIVE  07/10/2018  . IR ANGIOGRAM SELECTIVE EACH ADDITIONAL VESSEL  07/10/2018  . IR EMBO ART  VEN HEMORR LYMPH EXTRAV  INC GUIDE ROADMAPPING  07/10/2018  . IR NEPHROSTOMY PLACEMENT LEFT  04/05/2019  . IR NEPHROSTOMY PLACEMENT RIGHT  05/31/2019  . IR US GUIDE BX ASP/DRAIN  07/10/2018  . IR US GUIDE VASC ACCESS RIGHT  07/10/2018  . IR VENO/EXT/UNI LEFT  07/10/2018  . IRRIGATION AND DEBRIDEMENT OF WOUND WITH SPLIT THICKNESS SKIN GRAFT Left 09/19/2018   Procedure: Debridement of gluteal wound with placement  of acell to left leg.;  Surgeon: [name], DO;  Location: [place];  Service: Plastics;  Laterality: Left;  2.5 hours, please  . LAPAROTOMY N/A 07/12/2018   Procedure: EXPLORATORY LAPAROTOMY;  Surgeon: Georganna Skeans, MD;  Location: [place];  Service: General;  Laterality: N/A;  . LAPAROTOMY N/A 07/15/2018   Procedure: WOUND EXPLORATION; CLOSURE OF ABDOMEN;  Surgeon: Georganna Skeans, MD;  Location: [place];  Service: General;  Laterality: N/A;  . LAPAROTOMY  07/10/2018   Procedure: Exploratory Laparotomy;  Surgeon: Clovis Riley, MD;  Location: [place];  Service: General;;  . MASS EXCISION Left 09/18/2019   Procedure: EXCISION UPPER LEFT INNER THIGH WOUND;  Surgeon: [name], DO;  Location: [place];  Service: Plastics;  Laterality: Left;  . NEPHROLITHOTOMY Right 07/15/2019   Procedure: NEPHROLITHOTOMY PERCUTANEOUS;  Surgeon: Cleon Gustin, MD;  Location: [facility];  Service: Urology;  Laterality: Right;  90 MINS  .  PEG PLACEMENT N/A 08/14/2018   Procedure: PERCUTANEOUS ENDOSCOPIC GASTROSTOMY (PEG) PLACEMENT;  Surgeon: Georganna Skeans, MD;  Location: [place];  Service: General;  Laterality: N/A;  . PERCUTANEOUS TRACHEOSTOMY N/A 08/02/2018   Procedure: PERCUTANEOUS TRACHEOSTOMY;  Surgeon: Georganna Skeans, MD;  Location: [place];  Service: General;  Laterality: N/A;  . RADIOLOGY WITH ANESTHESIA N/A 07/10/2018   Procedure: IR WITH ANESTHESIA;  Surgeon: Sandi Mariscal, MD;  Location: [place];  Service: Radiology;  Laterality: N/A;  . RADIOLOGY WITH ANESTHESIA Right 07/10/2018   Procedure: Ir With Anesthesia;  Surgeon: Sandi Mariscal, MD;  Location: [place];  Service: Radiology;  Laterality: Right;  . SCROTAL EXPLORATION N/A 07/15/2018   Procedure: SCROTUM DEBRIDEMENT;  Surgeon: Franchot Gallo, MD;  Location: [place];  Service: Urology;  Laterality: N/A;  . SHOULDER SURGERY    . SKIN SPLIT GRAFT Right 09/19/2018   Procedure: Skin Graft Split Thickness;  Surgeon: [name], DO;  Location: [place];  Service: Plastics;  Laterality: Right;  . SKIN SPLIT GRAFT N/A 10/03/2018   Procedure: Split thickness skin graft to gluteal area with acell placement;  Surgeon: [name], DO;  Location: [place];  Service: Plastics;  Laterality: N/A;  3 hours, please  . VACUUM ASSISTED CLOSURE CHANGE N/A 07/12/2018   Procedure: ABDOMINAL VACUUM ASSISTED CLOSURE CHANGE and abdominal washout;  Surgeon: Georganna Skeans, MD;  Location: [place];  Service: General;  Laterality: N/A;  . WOUND DEBRIDEMENT Left 07/23/2018   Procedure: DEBRIDEMENT LEFT BUTTOCK  WOUND;  Surgeon: Georganna Skeans, MD;  Location: [place];  Service: General;  Laterality: Left;  . WOUND EXPLORATION Left 07/10/2018   Procedure: WOUND EXPLORATION LEFT GROIN;  Surgeon: Clovis Riley, MD;  Location[facility];  Service: General;  Laterality: Left;    Family History  Problem Relation Age of Onset  . Breast cancer Mother        with mets to the bones    Social History:   reports that he quit smoking about 17 months ago. His smoking use included cigarettes. He has a 20.00 pack-year smoking history. He has never used smokeless tobacco. He reports current drug use. Drugs: Oxycodone and Fentanyl. He reports that he does not drink alcohol.  Allergies:  Allergies  Allergen Reactions  . Omnicef [Cefdinir] Nausea And Vomiting    Medications:  No current facility-administered medications on file prior to encounter.   Current Outpatient Medications on File Prior to Encounter  Medication Sig Dispense Refill  . albuterol (VENTOLIN HFA) 108 (90 Base) MCG/ACT  inhaler TAKE 2 PUFFS BY MOUTH EVERY 6 HOURS AS NEEDED FOR WHEEZE OR SHORTNESS OF BREATH (Patient taking differently: Inhale 2 puffs into the lungs every 6 (six) hours as needed for wheezing or shortness of breath. ) 18 g 1  . Amino Acids-Protein Hydrolys (FEEDING SUPPLEMENT, PRO-STAT SUGAR FREE 64,) LIQD Take 30 mLs by mouth 3 (three) times daily with meals. (Patient taking differently: Take 30 mLs by mouth 2 (two) times daily. ) 887 mL 0  . apixaban (ELIQUIS) 5 MG TABS tablet Take 1 tablet (5 mg total) by mouth 2 (two) times daily. 60 tablet 0  . benzonatate (TESSALON) 200 MG capsule Take 1 capsule (200 mg total) by mouth 3 (three) times daily. 20 capsule 0  . clonazePAM (KLONOPIN) 0.5 MG tablet Take 0.5 tablets (0.25 mg total) by mouth 3 (three) times daily as needed for anxiety. (Patient taking differently: Take 0.25 mg by mouth in the morning, at noon, and at bedtime. ) 50 tablet 3  . dronabinol (MARINOL) 2.5 MG capsule TAKE 1 CAPSULE (2.5 MG TOTAL) BY MOUTH 2 (TWO) TIMES DAILY BEFORE A MEAL. (Patient taking differently: Take 2.5 mg by mouth 2 (two) times daily as needed (appetite). ) 60 capsule 5  . fentaNYL (DURAGESIC) 50 MCG/HR Place 1 patch onto the skin every other day. 15 patch 0  . gabapentin (NEURONTIN) 300 MG capsule TAKE 1 CAPSULE BY MOUTH 3 TIMES A DAY (Patient taking differently: Take 300 mg by mouth 3  (three) times daily. ) 90 capsule 6  . guaiFENesin (MUCINEX) 600 MG 12 hr tablet Take 1 tablet (600 mg total) by mouth 2 (two) times daily. (Patient taking differently: Take 1,200 mg by mouth 2 (two) times daily. ) 10 tablet 0  . melatonin ([employer] MELATONIN) 3 MG TABS tablet Take 1 tablet (3 mg total) by mouth at bedtime. 10 tablet 0  . methocarbamol (ROBAXIN) 500 MG tablet TAKE 1 TABLET BY MOUTH EVERY 6 HOURS AS NEEDED FOR MUSCLE SPASMS (Patient taking differently: Take 500 mg by mouth every 6 (six) hours as needed for muscle spasms. ) 100 tablet 11  . mirabegron ER (MYRBETRIQ) 50 MG TB24 tablet Take 50 mg by mouth daily.    . Multiple Vitamin (MULTIVITAMIN WITH MINERALS) TABS tablet Take 1 tablet by mouth daily.    . ondansetron (ZOFRAN) 4 MG tablet Take 1-2 tablets (4-8 mg total) by mouth every 8 (eight) hours as needed for nausea, vomiting or refractory nausea / vomiting (max dose in a day is 24 mg total). 120 tablet 5  . oxybutynin (DITROPAN) 5 MG tablet Take 5 mg by mouth 4 (four) times daily.     Marland Kitchen oxyCODONE-acetaminophen (PERCOCET/ROXICET) 5-325 MG tablet Take 1 tablet by mouth every 4 (four) hours as needed for severe pain. 120 tablet 0  . PARoxetine (PAXIL) 20 MG tablet Take 1 tablet (20 mg total) by mouth at bedtime. 30 tablet 5  . Probiotic CAPS Take 1 capsule by mouth daily.    . QUEtiapine (SEROQUEL) 50 MG tablet Take 50 mg by mouth at bedtime.     . Blood Pressure Monitoring (BLOOD PRESSURE CUFF) MISC Use daily as needed to check blood pressure. 1 each 0  . dicyclomine (BENTYL) 20 MG tablet Take 1 tablet (20 mg total) by mouth 4 (four) times daily -  before meals and at bedtime. (Patient not taking: Reported on 11/18/2019) 30 tablet 0  . enoxaparin (LOVENOX) 40 MG/0.4ML injection Inject 0.4 mLs (40 mg total) into the skin daily  for 2 days. 0.8 mL 0  . Phenazopyridine HCl (AZO STANDARD MAXIMUM STRENGTH PO) Take 2 tablets by mouth in the morning and at bedtime.    Marland Kitchen Respiratory Therapy  Supplies (SPIROMETER) KIT Use 3-4 times per hour. 1 kit 0  . senna (SENOKOT) 8.6 MG tablet Take 2 tablets by mouth at bedtime.       ROS: Constitutional: No fever or chills Vision: No changes in vision ENT: No difficulty swallowing CV: No chest pain Pulm: No SOB or wheezing GI: No nausea or vomiting GU: No urgency or inability to hold urine Skin: No poor wound healing Neurologic: No numbness or tingling Psychiatric: No depression or anxiety Heme: No bruising Allergic: No reaction to medications or food   Exam: Blood pressure (!) 158/92, pulse 62, temperature 97.8 F (36.6 C), temperature source Oral, resp. rate 20, height 6' 3" (1.905 m), weight 75.8 kg, SpO2 97 %. General: No acute distress Orientation: Awake alert and oriented x3 Mood and Affect: Cooperative and pleasant  Pelvis: Wound is antibiotic cement spacer in place.  It is protruding through the wound.  There is active drainage around it.  Notable pain with palpation and movement of the pelvis.  Neurovascularly baseline  Medical Decision Making: Data: Imaging: MRI of the pelvis shows a continued increase intake of the sacrum and pelvis.  Labs: No results found for this or any previous visit (from the past 24 hour(s)).  Imaging or Labs ordered: None  Medical history and chart was reviewed and case discussed with medical provider.  Assessment/Plan: Persistent infected nonunion of the pelvis with antibiotic spacer migration  We will plan to proceed with removal of antibiotic cement spacer with a repeat debridement with cultures.  We will perform an irrigation as well and not likely place ACell powder.  Patient was discharged home from PACU today unless he is in too much pain and needs to be admitted.  Shona Needles, MD [facility] [phone] (office) [email or web address]

## 2019-12-18 ENCOUNTER — Other Ambulatory Visit: Payer: Self-pay

## 2019-12-18 ENCOUNTER — Telehealth: Payer: Self-pay

## 2019-12-18 ENCOUNTER — Ambulatory Visit (INDEPENDENT_AMBULATORY_CARE_PROVIDER_SITE_OTHER): Payer: No Typology Code available for payment source | Admitting: Infectious Disease

## 2019-12-18 ENCOUNTER — Encounter: Payer: Self-pay | Admitting: Infectious Disease

## 2019-12-18 VITALS — BP 132/96 | HR 95

## 2019-12-18 DIAGNOSIS — F064 Anxiety 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: Secondary | ICD-10-CM

## 2019-12-18 DIAGNOSIS — L089 Local infection of the skin and subcutaneous tissue, unspecified: Secondary | ICD-10-CM

## 2019-12-18 DIAGNOSIS — Z228 Carrier of other infectious diseases: Secondary | ICD-10-CM

## 2019-12-18 DIAGNOSIS — T148XXA Other injury of unspecified body region, initial encounter: Secondary | ICD-10-CM | POA: Diagnosis not present

## 2019-12-18 DIAGNOSIS — M869 Osteomyelitis, unspecified: Secondary | ICD-10-CM

## 2019-12-18 DIAGNOSIS — M86652 Other chronic osteomyelitis, left thigh: Secondary | ICD-10-CM

## 2019-12-18 DIAGNOSIS — T07XXXA Unspecified multiple injuries, initial encounter: Secondary | ICD-10-CM

## 2019-12-18 DIAGNOSIS — A0472 Enterocolitis due to Clostridium difficile, not specified as recurrent: Secondary | ICD-10-CM | POA: Diagnosis not present

## 2019-12-18 MED ORDER — SODIUM CHLORIDE 0.9 % IV SOLN: 2.0000 g | Freq: Three times a day (TID) | INTRAVENOUS | 2 refills | Status: DC
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
## 2019-12-18 NOTE — Progress Notes (Signed)
Met with patient today during Dr. [name] Dam's visit. Discussed cefiderocol and potential side effects. Explained the [facility] and infusion process and answered all questions. They know to call with any issues that come up.

## 2019-12-18 NOTE — Telephone Encounter (Signed)
Per Dr. Daiva Eves called [facility] to schedule picc line. Spoke with Morrie Sheldon who was able to schedule appointment 7/9 at 2. Will need to arrive at Radiology by 1:45.  Patient was made aware of his appointment. Is okay with date/time.  Orders faxed to short stay and advance home infusion.  Patient will need to get first dose at short stay.  Lorenso Courier, [place]

## 2019-12-19 ENCOUNTER — Telehealth: Payer: Self-pay

## 2019-12-19 ENCOUNTER — Other Ambulatory Visit: Payer: Self-pay | Admitting: Physical Medicine and Rehabilitation

## 2019-12-19 MED ORDER — FENTANYL 50 MCG/HR TD PT72: 1.0000 | MEDICATED_PATCH | TRANSDERMAL | 0 refills | Status: DC

## 2019-12-19 MED ORDER — OXYCODONE-ACETAMINOPHEN 5-325 MG PO TABS: 1.0000 | ORAL_TABLET | ORAL | 0 refills | Status: DC | PRN

## 2019-12-19 NOTE — Telephone Encounter (Signed)
Received call today from [facility] with [employer] regarding PA for antibiotics. Corrie Dandy states workers comp is requiring PA due to medication cost. PA outcome might take a week.  Spoke with MD regarding issue; states if patient cannot get PA approved before picc appt to cancel and reschedule once outcome is reached.  Called Laretta Bolster, RN case manager regarding concern. Was not aware a PA was required. Will follow up with [employer] to provide any assistance needed for PA approal. Will call CMA back with updated information.  Will continue to follow. Will cancel appointment if no update is provided later today. Jose Darnelle Bos

## 2019-12-19 NOTE — Progress Notes (Signed)
Patient is a 69 male here for follow-up after undergoing debridement of superficial wound and pelvic osteomyelitis, removal of posterior pelvis antibiotic cement spacer, and placement of ACell on 12/13/2019 with Dr. Jena Gauss and Dr. Ulice Bold.  Patient presents with his wife today.  Patient reports he is in quite a bit of pain today.  Has already taken 2 oxycodone and is still hurting considerably.  Dr. Ulice Bold present and examined the wound.  Sore VAC is in place covering the wound.  Erythema present on some of the skin inferior to the wound.  Overall wound is doing well.  Some drainage present.  Continue to do daily dressing changes of K-Y jelly applied to the Sorbact (green) mesh.  Cover with gauze, ABDs, and secure with tape.  May apply Desitin to the skin around the wound that is red to protect from the excess moisture from the K-Y jelly.  Continue following antibiotic treatment set out by Dr. Jena Gauss.  Follow-up in 2 to 3 weeks.  Call office with any questions/concerns.  The 21st Century Cures Act was signed into law in 2016 which includes the topic of electronic health records.  This provides immediate access to information in MyChart.  This includes consultation notes, operative notes, office notes, lab results and pathology reports.  If you have any questions about what you read please let us know at your next visit or call us at the office.  We are right here with you.

## 2019-12-19 NOTE — Telephone Encounter (Signed)
Received call back from RN case manager stating PA was a results of a billing issue. Is currently working on getting it resolved. Will keep CMA up to date on PA outcome. Case manger is optimistic infusion will be approved. Lorenso Courier, [place]

## 2019-12-19 NOTE — Telephone Encounter (Signed)
Sent!

## 2019-12-19 NOTE — Telephone Encounter (Signed)
Patients wife called requesting refill Fentanyl patch, last filled #10 on 11/28/2019 and Oxycodone last filled #120 on 11/22/2019. Next appt 12/30/2019. Dr. Dahlia Client patient.

## 2019-12-20 ENCOUNTER — Ambulatory Visit (INDEPENDENT_AMBULATORY_CARE_PROVIDER_SITE_OTHER): Payer: No Typology Code available for payment source | Admitting: Plastic Surgery

## 2019-12-20 ENCOUNTER — Ambulatory Visit (HOSPITAL_COMMUNITY)
Admission: RE | Admit: 2019-12-20 | Discharge: 2019-12-20 | Disposition: A | Discharge status: Home / Self Care | Payer: Self-pay | Source: Ambulatory Visit | Attending: Infectious Disease | Admitting: Infectious Disease

## 2019-12-20 ENCOUNTER — Other Ambulatory Visit: Payer: Self-pay

## 2019-12-20 ENCOUNTER — Other Ambulatory Visit: Payer: Self-pay | Admitting: Infectious Disease

## 2019-12-20 ENCOUNTER — Inpatient Hospital Stay (HOSPITAL_COMMUNITY): Admission: RE | Admit: 2019-12-20 | Payer: Self-pay | Source: Ambulatory Visit

## 2019-12-20 ENCOUNTER — Encounter: Payer: Self-pay | Admitting: Plastic Surgery

## 2019-12-20 VITALS — BP 118/80 | HR 94 | Temp 97.6°F

## 2019-12-20 DIAGNOSIS — M86652 Other chronic osteomyelitis, left thigh: Secondary | ICD-10-CM

## 2019-12-20 DIAGNOSIS — Z228 Carrier of other infectious diseases: Secondary | ICD-10-CM

## 2019-12-20 HISTORY — PX: IR FLUORO GUIDE CV LINE LEFT: IMG2282

## 2019-12-20 MED ORDER — LIDOCAINE HCL (PF) 1 % IJ SOLN
INTRAMUSCULAR | Status: DC | PRN
  Administered 2019-12-20: 5 mL

## 2019-12-20 MED ORDER — LIDOCAINE HCL 1 % IJ SOLN
INTRAMUSCULAR | Status: AC
  Filled 2019-12-20: qty 20

## 2019-12-20 MED ORDER — SODIUM CHLORIDE 0.9 % IV SOLN
2.0000 g | Freq: Three times a day (TID) | INTRAVENOUS | Status: DC
  Administered 2019-12-20: 2 g via INTRAVENOUS
  Filled 2019-12-20 (×3): qty 22.4

## 2019-12-20 NOTE — Procedures (Addendum)
PROCEDURE SUMMARY:  Successful placement of image-guided single lumen PICC line to the left basilic vein. Length 49 cm. Tip at lower SVC/RA. No complications. EBL < 3 mL. Ready for use.  Please see imaging section of Epic for full dictation.   Gordy Councilman Kaliel Bolds PA-C 12/20/2019 3:15 PM

## 2019-12-23 ENCOUNTER — Encounter (HOSPITAL_COMMUNITY): Payer: Self-pay

## 2019-12-23 ENCOUNTER — Other Ambulatory Visit: Payer: Self-pay | Admitting: Infectious Disease

## 2019-12-23 DIAGNOSIS — Z228 Carrier of other infectious diseases: Secondary | ICD-10-CM

## 2019-12-23 DIAGNOSIS — M86652 Other chronic osteomyelitis, left thigh: Secondary | ICD-10-CM

## 2019-12-23 HISTORY — PX: IR US GUIDE VASC ACCESS LEFT: IMG2389

## 2019-12-25 ENCOUNTER — Encounter (HOSPITAL_COMMUNITY): Payer: Self-pay | Admitting: Radiology

## 2019-12-25 ENCOUNTER — Other Ambulatory Visit: Payer: Self-pay | Admitting: Infectious Disease

## 2019-12-25 DIAGNOSIS — Z228 Carrier of other infectious diseases: Secondary | ICD-10-CM

## 2019-12-25 DIAGNOSIS — M86652 Other chronic osteomyelitis, left thigh: Secondary | ICD-10-CM

## 2019-12-25 LAB — MIC RESULT

## 2019-12-25 LAB — MISC LABCORP TEST (SEND OUT): Labcorp test code: 8680

## 2019-12-25 LAB — MINIMUM INHIBITORY CONC. (1 DRUG)

## 2019-12-30 ENCOUNTER — Other Ambulatory Visit: Payer: Self-pay

## 2019-12-30 ENCOUNTER — Encounter
Payer: No Typology Code available for payment source | Attending: Physical Medicine and Rehabilitation | Admitting: Physical Medicine and Rehabilitation

## 2019-12-30 ENCOUNTER — Encounter: Payer: Self-pay | Admitting: Physical Medicine and Rehabilitation

## 2019-12-30 VITALS — BP 161/99 | HR 71 | Temp 98.2°F | Ht 75.0 in | Wt 167.8 lb

## 2019-12-30 DIAGNOSIS — S31829D Unspecified open wound of left buttock, subsequent encounter: Secondary | ICD-10-CM | POA: Insufficient documentation

## 2019-12-30 DIAGNOSIS — N200 Calculus of kidney: Secondary | ICD-10-CM | POA: Insufficient documentation

## 2019-12-30 DIAGNOSIS — Z79891 Long term (current) use of opiate analgesic: Secondary | ICD-10-CM | POA: Diagnosis present

## 2019-12-30 DIAGNOSIS — Z9359 Other cystostomy status: Secondary | ICD-10-CM | POA: Diagnosis not present

## 2019-12-30 DIAGNOSIS — F411 Generalized anxiety disorder: Secondary | ICD-10-CM | POA: Insufficient documentation

## 2019-12-30 DIAGNOSIS — G894 Chronic pain syndrome: Secondary | ICD-10-CM | POA: Insufficient documentation

## 2019-12-30 DIAGNOSIS — G8921 Chronic pain due to trauma: Secondary | ICD-10-CM | POA: Insufficient documentation

## 2019-12-30 DIAGNOSIS — Z452 Encounter for adjustment and management of vascular access device: Secondary | ICD-10-CM | POA: Diagnosis present

## 2019-12-30 DIAGNOSIS — M792 Neuralgia and neuritis, unspecified: Secondary | ICD-10-CM

## 2019-12-30 DIAGNOSIS — Z933 Colostomy status: Secondary | ICD-10-CM | POA: Diagnosis not present

## 2019-12-30 DIAGNOSIS — T07XXXA Unspecified multiple injuries, initial encounter: Secondary | ICD-10-CM | POA: Diagnosis present

## 2019-12-30 DIAGNOSIS — M869 Osteomyelitis, unspecified: Secondary | ICD-10-CM | POA: Diagnosis not present

## 2019-12-30 DIAGNOSIS — Z5181 Encounter for therapeutic drug level monitoring: Secondary | ICD-10-CM | POA: Insufficient documentation

## 2019-12-30 DIAGNOSIS — M86152 Other acute osteomyelitis, left femur: Secondary | ICD-10-CM

## 2019-12-30 MED ORDER — OXYCODONE-ACETAMINOPHEN 5-325 MG PO TABS: 1.0000 | ORAL_TABLET | ORAL | 0 refills | Status: DC | PRN

## 2019-12-30 MED ORDER — NALOXONE HCL 4 MG/0.1ML NA LIQD: NASAL | 3 refills | Status: DC

## 2019-12-30 NOTE — Progress Notes (Signed)
Subjective:    Patient ID: Christopher [name], male    DOB: 05-Feb-1966, 54 y.o.   MRN: [id number]  HPI   Pt is a 54 yr old male with hx of Multitrauma- causing L femoral neck fx, degloving of L hip to going/scrotum, bladder neck trauma- got SPC, developed compartment syndrome -s/p surgery for that; also diverting colostomy, skin grafts, and s/p trachand PEG- PEG is out. 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 B/L tennis elbow new-and B/L pending/forming ulnar neuropathy  Since saw him last:  Another surgery- cement in pelvis was coming loose and was coming out the wound- found out still had osteomyelitis- started on Fetroja- IV 3x/day- 3 hrs at a time- wife exhausted!  NWB on LLE for 4-6 weeks. Surgery was 7/3. Don't want significant ROM since cement gone.  A lot of pain lately. - Pain more than normal-off and on.  Goes back to Dr Jena Gauss end of July- ROM restrictions??   Stomach started hurting last 24+ hours- having stools, but not tons- passing a LOT of gas.  Hasn't used ant-acid or gas-x.   BP was a little elevated lately- on and off- when in a lot of pain.   Not working with PT/OT at all right now.  Wasn't able to get MRI of R knee after last appointment- has plans to reschedule it.   Hasn't changed nursing case mgr.   Has court date 01/20/20- lawyer visit tomorrow with [employer] for attendant care and w/c van.   Has been using yogurt - can get liquid ones down quicker.   Was able to get 1 Episport brace- not B/L Asking nurse case mgr.    Gives Miralax and laxatives- so does OK with constipation.    Wife quirt her job in June due to caring for pt.        Pain Inventory Average Pain 9 Pain Right Now 9 My pain is constant,  sharp, burning, dull, stabbing, tingling and aching  In the last 24 hours, has pain interfered with the following? General activity 9 Relation with others 9 Enjoyment of life 9 What TIME of day is your pain at its worst? morning, daytime, evenning & night.  Sleep (in general) Fair  Pain is worse with: walking, sitting, inactivity, standing and some activites Pain improves with: rest and medication Relief from Meds: 7  Mobility walk with assistance use a walker how many minutes can you walk? 5 mins ability to climb steps?  no do you drive?  no use a wheelchair needs help with transfers  Function disabled: date disabled 07/10/2018 I need assistance with the following:  dressing, bathing, toileting, meal prep, household duties and shopping Do you have any goals in this area?  yes  Neuro/Psych bladder control problems bowel control problems weakness numbness tremor tingling spasms dizziness confusion anxiety  Prior Studies Any changes since last visit?  yes CT/MRI MRI at [facility]  Physicians involved in your care Any changes since last visit?  no   Family History  Problem Relation Age of Onset  . Breast cancer Mother        with mets to the bones   Social History   Socioeconomic History  . Marital status: Married    Spouse name: Not on  file  . Number of children: Not on file  . Years of education: Not on file  . Highest education level: Not on file  Occupational History  . Not on file  Tobacco Use  . Smoking status: Former Smoker    Packs/day: 1.00    Years: 20.00    Pack years: 20.00    Types: Cigarettes    Quit date: 07/10/2018    Years since quitting: 1.4  . Smokeless tobacco: Never Used  Vaping Use  . Vaping Use: Never used  Substance and Sexual Activity  . Alcohol use: Never  . Drug use: Yes    Types: Oxycodone, Fentanyl    Comment: Fentanyl patch/oxycodone since 06/2018  . Sexual activity: Yes  Other Topics Concern  . Not on file  Social  History Narrative   ** Merged History Encounter **       Social Determinants of Health   Financial Resource Strain: Low Risk   . Difficulty of Paying Living Expenses: Not very hard  Food Insecurity: Food Insecurity Present  . Worried About Programme researcher, broadcasting/film/video in the Last Year: Sometimes true  . Ran Out of Food in the Last Year: Sometimes true  Transportation Needs: No Transportation Needs  . Lack of Transportation (Medical): No  . Lack of Transportation (Non-Medical): No  Physical Activity: Inactive  . Days of Exercise per Week: 0 days  . Minutes of Exercise per Session: 0 min  Stress: Stress Concern Present  . Feeling of Stress : Rather much  Social Connections:   . Frequency of Communication with Friends and Family:   . Frequency of Social Gatherings with Friends and Family:   . Attends Religious Services:   . Active Member of Clubs or Organizations:   . Attends Banker Meetings:   Marland Kitchen Marital Status:    Past Surgical History:  Procedure Laterality Date  . APPLICATION OF A-CELL OF BACK N/A 08/06/2018   Procedure: Application Of A-Cell Of Back;  Surgeon: Peggye Form, DO;  [facility]: [facility];  Service: Plastics;  Laterality: N/A;  . APPLICATION OF A-CELL OF EXTREMITY Left 08/06/2018   Procedure: Application Of A-Cell Of Extremity;  Surgeon: Peggye Form, DO;  [facility]: [facility];  Service: Plastics;  Laterality: Left;  . APPLICATION OF A-CELL OF EXTREMITY Left 09/18/2019   Procedure: APPLICATION OF A-CELL OF EXTREMITY;  Surgeon: Peggye Form, DO;  [facility]: [facility];  Service: Plastics;  Laterality: Left;  . APPLICATION OF WOUND VAC  07/12/2018   Procedure: Application Of Wound Vac to the Left Thigh and Scrotum.;  Surgeon: Roby Lofts, MD;  [facility]: [facility];  Service: Orthopedics;;  . APPLICATION OF WOUND VAC  07/10/2018   Procedure: Application Of Wound Vac;  Surgeon: Berna Bue, MD;  [facility]: [facility];  Service: General;;  . COLON SURGERY  2020    colostomy  . COLOSTOMY N/A 07/23/2018   Procedure: COLOSTOMY;  Surgeon: Violeta Gelinas, MD;  Location: [facility] OR;  Service: General;  Laterality: N/A;  . CYSTOSCOPY W/ URETERAL STENT PLACEMENT N/A 07/15/2018   Procedure: RETROGRADE URETHROGRAM;  Surgeon: Marcine Matar, MD;  Location: [facility] OR;  Service: Urology;  Laterality: N/A;  . CYSTOSCOPY WITH LITHOLAPAXY N/A 05/06/2019   Procedure: CYSTOSCOPY BASKET BLADDER STONE EXTRACTION;  Surgeon: Malen Gauze, MD;  Location: [facility];  Service: Urology;  Laterality: N/A;  30 MINS  . CYSTOSTOMY N/A 05/06/2019   Procedure: REPLACEMENT OF SUPRAPUBIC CATHETER;  Surgeon: Malen Gauze,  MD;  Location: [facility];  Service: Urology;  Laterality: N/A;  . DEBRIDEMENT AND CLOSURE WOUND Left 03/04/2019   Procedure: Excision of hip wound with placement of Acell;  Surgeon: Peggye Form, DO;  [facility];  Service: Plastics;  Laterality: Left;  . ESOPHAGOGASTRODUODENOSCOPY N/A 08/14/2018   Procedure: ESOPHAGOGASTRODUODENOSCOPY (EGD);  Surgeon: Violeta Gelinas, MD;  Location: [facility] ENDOSCOPY;  Service: General;  Laterality: N/A;  bedside  . FACIAL RECONSTRUCTION SURGERY     X 2--once as a teenager and second time in his 68's  . HARDWARE REMOVAL Left 03/04/2019   Procedure: Left Hip Hardware Removal;  Surgeon: Roby Lofts, MD;  [facility];  Service: Orthopedics;  Laterality: Left;  . HIP PINNING,CANNULATED Left 07/12/2018   Procedure: CANNULATED HIP PINNING;  Surgeon: Roby Lofts, MD;  [facility];  Service: Orthopedics;  Laterality: Left;  . HIP SURGERY    . HOLMIUM LASER APPLICATION Right 07/15/2019   Procedure: HOLMIUM LASER APPLICATION;  Surgeon: Malen Gauze, MD;  Location[facility];  Service: Urology;  Laterality: Right;  . I & D EXTREMITY Left 07/25/2018   Procedure: Debridement of buttock, scrotum and left leg, placement of acell and vac;  Surgeon: Peggye Form,  DO;  [facility];  Service: Plastics;  Laterality: Left;  . I & D EXTREMITY N/A 08/06/2018   Procedure: Debridement of buttock, scrotum and left leg;  Surgeon: Peggye Form, DO;  [facility];  Service: Plastics;  Laterality: N/A;  . I & D EXTREMITY N/A 08/13/2018   Procedure: Debridement of buttock, scrotum and left leg, placement of acell and vac;  Surgeon: Peggye Form, DO;  [facility];  Service: Plastics;  Laterality: N/A;  90 min, please  . INCISION AND DRAINAGE HIP Left 09/18/2019   Procedure: IRRIGATION AND DEBRIDEMENT HIP/ PELVIS WITH WOUND VAC PLACEMENT;  Surgeon: Roby Lofts, MD;  [facility];  Service: Orthopedics;  Laterality: Left;  . INCISION AND DRAINAGE OF WOUND N/A 07/18/2018   Procedure: Debridement of left leg, buttocks and scrotal wound with placement of acell and Flexiseal;  Surgeon: Peggye Form, DO;  [facility];  Service: Plastics;  Laterality: N/A;  . INCISION AND DRAINAGE OF WOUND Left 08/29/2018   Procedure: Debridement of buttock, scrotum and left leg, placement of acell and vac;  Surgeon: Peggye Form, DO;  [facility];  Service: Plastics;  Laterality: Left;  75 min, please  . INCISION AND DRAINAGE OF WOUND Bilateral 10/23/2018   Procedure: DEBRIDEMENT OF BUTTOCK,SCROTUM, AND LEG WOUNDS WITH PLACEMENT OF ACELL- BILATERAL 90 MIN;  Surgeon: Peggye Form, DO;  [facility];  Service: Plastics;  Laterality: Bilateral;  . IR ANGIOGRAM PELVIS SELECTIVE OR SUPRASELECTIVE  07/10/2018  . IR ANGIOGRAM PELVIS SELECTIVE OR SUPRASELECTIVE  07/10/2018  . IR ANGIOGRAM SELECTIVE EACH ADDITIONAL VESSEL  07/10/2018  . IR EMBO ART  VEN HEMORR LYMPH EXTRAV  INC GUIDE ROADMAPPING  07/10/2018  . IR NEPHROSTOMY PLACEMENT LEFT  04/05/2019  . IR NEPHROSTOMY PLACEMENT RIGHT  05/31/2019  . IR US GUIDE BX ASP/DRAIN  07/10/2018  . IR US GUIDE VASC ACCESS RIGHT  07/10/2018  . IR VENO/EXT/UNI LEFT  07/10/2018  . IRRIGATION AND DEBRIDEMENT OF  WOUND WITH SPLIT THICKNESS SKIN GRAFT Left 09/19/2018   Procedure: Debridement of gluteal wound with placement of acell to left leg.;  Surgeon: Peggye Form, DO;  [facility];  Service: Plastics;  Laterality: Left;  2.5 hours, please  . LAPAROTOMY N/A 07/12/2018   Procedure: EXPLORATORY LAPAROTOMY;  Surgeon: Violeta Gelinashompson, Burke, MD;  [facility];  Service: General;  Laterality: N/A;  . LAPAROTOMY N/A 07/15/2018   Procedure: WOUND EXPLORATION; CLOSURE OF ABDOMEN;  Surgeon: Violeta Gelinashompson, Burke, MD;  [facility];  Service: General;  Laterality: N/A;  . LAPAROTOMY  07/10/2018   Procedure: Exploratory Laparotomy;  Surgeon: Berna Bueonnor, Chelsea A, MD;  [facility];  Service: General;;  . MASS EXCISION Left 09/18/2019   Procedure: EXCISION UPPER LEFT INNER THIGH WOUND;  Surgeon: Peggye Formillingham, Claire S, DO;  [facility];  Service: Plastics;  Laterality: Left;  . NEPHROLITHOTOMY Right 07/15/2019   Procedure: NEPHROLITHOTOMY PERCUTANEOUS;  Surgeon: Malen GauzeMcKenzie, Patrick L, MD;  [facility];  Service: Urology;  Laterality: Right;  90 MINS  . PEG PLACEMENT N/A 08/14/2018   Procedure: PERCUTANEOUS ENDOSCOPIC GASTROSTOMY (PEG) PLACEMENT;  Surgeon: Violeta Gelinashompson, Burke, MD;  [facility];  Service: General;  Laterality: N/A;  . PERCUTANEOUS TRACHEOSTOMY N/A 08/02/2018   Procedure: PERCUTANEOUS TRACHEOSTOMY;  Surgeon: Violeta Gelinashompson, Burke, MD;  [facility];  Service: General;  Laterality: N/A;  . RADIOLOGY WITH ANESTHESIA N/A 07/10/2018   Procedure: IR WITH ANESTHESIA;  Surgeon: Simonne ComeWatts, John, MD;  [facility];  Service: Radiology;  Laterality: N/A;  . RADIOLOGY WITH ANESTHESIA Right 07/10/2018   Procedure: Ir With Anesthesia;  Surgeon: Simonne ComeWatts, John, MD;  [facility];  Service: Radiology;  Laterality: Right;  . SCROTAL EXPLORATION N/A 07/15/2018   Procedure: SCROTUM DEBRIDEMENT;  Surgeon: Marcine Matarahlstedt, Stephen, MD;  [facility];  Service: Urology;  Laterality: N/A;  . SHOULDER  SURGERY    . SKIN SPLIT GRAFT Right 09/19/2018   Procedure: Skin Graft Split Thickness;  Surgeon: Peggye Formillingham, Claire S, DO;  [facility];  Service: Plastics;  Laterality: Right;  . SKIN SPLIT GRAFT N/A 10/03/2018   Procedure: Split thickness skin graft to gluteal area with acell placement;  Surgeon: Peggye Formillingham, Claire S, DO;  [facility];  Service: Plastics;  Laterality: N/A;  3 hours, please  . VACUUM ASSISTED CLOSURE CHANGE N/A 07/12/2018   Procedure: ABDOMINAL VACUUM ASSISTED CLOSURE CHANGE and abdominal washout;  Surgeon: Violeta Gelinashompson, Burke, MD;  [facility];  Service: General;  Laterality: N/A;  . WOUND DEBRIDEMENT Left 07/23/2018   Procedure: DEBRIDEMENT LEFT BUTTOCK  WOUND;  Surgeon: Violeta Gelinashompson, Burke, MD;  [facility];  Service: General;  Laterality: Left;  . WOUND EXPLORATION Left 07/10/2018   Procedure: WOUND EXPLORATION LEFT GROIN;  Surgeon: Berna Bueonnor, Chelsea A, MD;  [facility];  Service: General;  Laterality: Left;   Past Medical History:  Diagnosis Date  . Acute on chronic respiratory failure with hypoxia (HCC) 06/2018   trach removed 11-16-2018, on vent from jan until may 2020 - uses albuterol prn  . Anxiety   . Bacteremia due to Pseudomonas 06/2018  . Chronic osteomyelitis (HCC)   . Chronic pain syndrome   . Clostridium difficile colitis 10/30/2019   tx with abx   . Depression   . DVT (deep venous thrombosis) (HCC) 2020   right brachial post PICC line  . History of blood transfusion 06/2018  . History of kidney stones   . Hypertension    norvasc d/c by pcp on 11/05/19  . Multiple traumatic injuries   . Penile pain 11/18/2019  . Pneumonia 11/2009   2020 x 2  . Dan HumphreysWalker as ambulation aid   . Wheelchair bound    electric  .  Wound discharge    left hip wound with bloody/clear drainage change dressing q day surgilube with gauze, between legs wound using calcium algenate pad bid   BP (!) 161/99   Pulse 71   Temp 98.2 F (36.8 C)   Ht  (1.905 m)   Wt 167 lb  12.8 oz (76.1 kg) Comment: per wife 2 weeks ago. patient in motor wheelchair  SpO2 93%   BMI 20.97 kg/m   Opioid Risk Score:   Fall Risk Score:  `1  Depression screen PHQ 2/9  Depression screen [facility] 2/9 12/18/2019 04/10/2019 12/27/2018  Decreased Interest 0 0 2  Down, Depressed, Hopeless 0 0 0  PHQ - 2 Score 0 0 2  Altered sleeping - - 1  Tired, decreased energy - - 2  Change in appetite - - 1  Feeling bad or failure about yourself  - - 1  Trouble concentrating - - 0  Moving slowly or fidgety/restless - - 2  Suicidal thoughts - - 0  PHQ-9 Score - - 9  Some recent data might be hidden   Review of Systems  Constitutional: Negative.   HENT: Negative.   Eyes: Negative.   Respiratory: Negative.   Cardiovascular: Negative.   Gastrointestinal:       Gas with liquid stools  Endocrine: Negative.   Genitourinary: Negative for enuresis.       Foley in place  Musculoskeletal: Positive for back pain and gait problem.       Mid back and left hip pain spasms  Skin: Negative.   Allergic/Immunologic: Negative.   Neurological: Positive for dizziness, tremors, weakness and numbness.       Tingling in legs  Psychiatric/Behavioral:       Anxiety  All other systems reviewed and are negative.      Objective:   Physical Exam  Awake, alert, grunting due to pain, accompanied by wife, in severe pain this AM- even after receiving prn meds, in power w/c, frequent passing gas/burping,  Has suprapubic catheter in place- draining light amber urine Has colostomy- colostomy site sticking out more- and reddish pink- no signs of infection- tiny amount of stool in bag  167 labs- abd sticking out some- appears distended Epigastric TTP- hyperactive BS- a little firm- somewhat distended Mild R knee effusion- TTP under patella/distally.       Assessment & Plan:  Pt is a 54 yr old male with hx of Multitrauma- causing L femoral neck fx, degloving of L hip to going/scrotum, bladder neck trauma- got SPC,  developed compartment syndrome -s/p surgery for that; also diverting colostomy, skin grafts, and s/p trachand PEG- PEG is out. 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 B/L tennis elbow new-and B/L pending/forming ulnar neuropathy Osteomyelitis of L hip found again- on Fetroja IV ABX 3x/day for 3 hours increments. With new Abd pain.epigastric pain.    1. Can call ID to see if OK to use Simethicone/Gas-x- let ID doctor know increased abd pain/burping/passing gas, epigastric pain- and ask if can do a LOW dose Prilosec.    2. Will increase Percocet to 5/325 q3 hours prn- from #120 tabs to #180 tabs/month.  Discussed increasing dose vs increasing frequency- wants increased frequency.   3. Needs attendant care- Pt needs 18-24 hours/day  care- of medical /nursing type care- CNA and LPN as well as non- medical care level of care- for giving pain meds, for giving IV ABX, for wound care, for colostomy care, for suprapubic care- current care given is NON medical, so leaves wife still doing all of medical care- wife does ALL WOUND care, IV ABX,  There are times he might need less, but lately, is truly requiring 18-24 hrs/day-esp since now NWB on LLE.  Needs pain medication oversight, so pt doesn't accidentally overdose- which is somewhat common in patients that manage their own pain meds.- 5-6 hours/day intermittent care is medical care- so CNA/LPN level care, and the rest if attendant care.    4. Will get another Episport brace from worker's comp  5. Needs R knee MRI to be rescheduled-when possible  6. Wait for PT/OT to restart until Dr Jena Gauss clears for WBAT.  7. Continue nursing care as ordered.   8. Narcan just in case in house- will order nasal spray- #2 3 RFs- if this is  used, call 911 as well.   9. F/U in 1 month  I spent a total of 45 minutes on visit, including with nursing care mgr.

## 2019-12-30 NOTE — Patient Instructions (Addendum)
Pt is a 54 yr old male with hx of Multitrauma- causing L femoral neck fx, degloving of L hip to going/scrotum, bladder neck trauma- got SPC, developed compartment syndrome -s/p surgery for that; also diverting colostomy, skin grafts, and s/p trachand PEG- PEG is out. 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 B/L tennis elbow new-and B/L pending/forming ulnar neuropathy Osteomyelitis of L hip found again- on Fetroja IV ABX 3x/day for 3 hours increments. With new Abd pain.epigastric pain.    1. Can call ID to see if OK to use Simethicone/Gas-x- let ID doctor know increased abd pain/burping/passing gas, epigastric pain- and ask if can do a LOW dose Prilosec.    2. Will increase Percocet to 5/325 q3 hours prn- from #120 tabs to #180 tabs/month.  Discussed increasing dose vs increasing frequency- wants increased frequency.   3. Needs attendant care- Pt needs 18-24 hours/day care- of medical /nursing type care- NT and LPN level of care- for giving pain meds, for giving IV ABX, for wound care, for colostomy care, for suprapubic care- current care given is NON medical, so leaves wife still doing all of medical care- wife does ALL WOUND care, IV ABX,  There are times he might need less, but lately, is truly requiring 18-24 hrs/day-esp since now NWB on LLE.  Needs pain medication oversight, so pt doesn't accidentally overdose- which is somewhat common in patients that manage their own pain meds.Marland Kitchen 5-6 hours/day intermittent care is medical care- so CNA/LPN level care, and the rest if attendant care.    4. Will get another Episport brace from worker's comp- showed how to wear.   5. Needs R knee MRI to be rescheduled-when possible  6. Wait for PT/OT to restart until Dr Jena Gauss clears  for WBAT.  7. Continue nursing care as ordered.   8. Narcan just in case in house- will order nasal spray- #2 3 RFs- if this is used, call 911 as well.   9. F/U in 1 month

## 2020-01-01 ENCOUNTER — Telehealth: Payer: Self-pay | Admitting: Pharmacist

## 2020-01-01 ENCOUNTER — Other Ambulatory Visit: Payer: Self-pay | Admitting: Family Medicine

## 2020-01-01 DIAGNOSIS — Z9189 Other specified personal risk factors, not elsewhere classified: Secondary | ICD-10-CM

## 2020-01-01 DIAGNOSIS — M86652 Other chronic osteomyelitis, left thigh: Secondary | ICD-10-CM

## 2020-01-01 MED ORDER — CIPROFLOXACIN HCL 500 MG PO TABS: 500.0000 mg | ORAL_TABLET | Freq: Two times a day (BID) | ORAL | 1 refills | Status: DC

## 2020-01-01 MED ORDER — VANCOMYCIN HCL 125 MG PO CAPS: 125.0000 mg | ORAL_CAPSULE | Freq: Four times a day (QID) | ORAL | 1 refills | Status: DC

## 2020-01-01 MED ORDER — VANCOMYCIN HCL 1000 MG/200ML IV SOLN: 1000.0000 mg | Freq: Three times a day (TID) | INTRAVENOUS | 8 refills | Status: DC

## 2020-01-01 NOTE — Telephone Encounter (Signed)
Talked to Dr. Tommy Medal regarding patient's culture and sensitivities. 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 Cefiderocol was intermediate and Avycaz was resistant. Patient also growing corynebacterium as well. Discussed options and will transition patient to oral ciprofloxacin, oral vancomycin for cdiff prevention, and IV vancomycin. His isolate will be resent for Recarbrio susceptibilities.   Called patient's wife and explained changes. He has been taking cefiderocol without issues. She states his pain usually gets better with antibiotics and it isnt this time. I told her it was probably because the antibiotic isn't the best after we got more information and he also has the corynebacterium that isn't being treated. She is good with the changes, and I will send his oral antibiotics to [facility]. She knows to call us ASAP with any loose, watery stools.  Called and spoke to [facility] and gave verbal order per Dr. Tommy Medal to stop cefiderocol and start IV vancomycin 1750 mg IV x 1 then 1000 mg IV q8hr. Labs will be CBC w diff, BMP, and vanc trough on Monday + another BMP and vanc trough on Thursday; ESR + CRP weekly as well. Patient will get loading dose tomorrow and start taking q8h thereafter.

## 2020-01-03 NOTE — Telephone Encounter (Signed)
Patient's wife called [facility] stating that the pharmacy didn't have Cipro or oral Vancomycin yet.  RN called [facility]. They found the prescriptions sent 7/21 and will be ready for pick up in 2 hours.  RN relayed to Amy who will notify the patient. Andree Coss, RN

## 2020-01-04 ENCOUNTER — Telehealth: Payer: Self-pay | Admitting: Infectious Disease

## 2020-01-04 NOTE — Telephone Encounter (Signed)
I was called Friday night by wife  She was concerned with how her husband was doing  He was having abdominal pain that progressed last night   He had started oral cipro, oral vanco and IV vancomycin  I suggested stopping antibiotics altogether and step wise adding back the ab  Today on Saturday wife started IV vancomycin today the oral cipro without problems  Nausea is better on antiemetics  Still having abdominal pain.

## 2020-01-06 LAB — AEROBIC/ANAEROBIC CULTURE W GRAM STAIN (SURGICAL/DEEP WOUND): Gram Stain: NONE SEEN

## 2020-01-06 NOTE — Telephone Encounter (Signed)
That is a relief!

## 2020-01-06 NOTE — Progress Notes (Signed)
Patient is a 54 year old male here for follow-up after undergoing debridement of superficial wound and pelvic osteomyelitis, removal of posterior pelvis antibiotic cement spacer, and placement of ACell on 12/13/19 with Dr. Jena Gauss and Dr. Ulice Bold.  Patient presents today with his wife. Reports some improvement since last visit but he feels the pain is still pretty bad. He is currently on oral and IV vancomycin and oral Cipro. He started this regimen on Friday. Wife reports that the sore back mesh came off. Overall the wound is improving.  Continue daily dressing changes of K-Y jelly and cover with gauze and tape. May continue to apply Desitin to red skin around the wound.  Follow up in 3-4 weeks. Call office with any questions/concerns.   The 21st Century Cures Act was signed into law in 2016 which includes the topic of electronic health records.  This provides immediate access to information in MyChart.  This includes consultation notes, operative notes, office notes, lab results and pathology reports.  If you have any questions about what you read please let us know at your next visit or call us at the office.  We are right here with you.

## 2020-01-06 NOTE — Telephone Encounter (Signed)
Spoke with wife this morning.  He is feeling much better today. He is taking IV vancomycin, oral vancomycin, and oral cipro. Andree Coss, RN

## 2020-01-07 ENCOUNTER — Encounter: Payer: Self-pay | Admitting: Plastic Surgery

## 2020-01-07 ENCOUNTER — Ambulatory Visit (INDEPENDENT_AMBULATORY_CARE_PROVIDER_SITE_OTHER): Payer: No Typology Code available for payment source | Admitting: Plastic Surgery

## 2020-01-07 ENCOUNTER — Other Ambulatory Visit: Payer: Self-pay

## 2020-01-07 VITALS — BP 141/92 | HR 75 | Temp 98.2°F

## 2020-01-07 DIAGNOSIS — S31829D Unspecified open wound of left buttock, subsequent encounter: Secondary | ICD-10-CM

## 2020-01-07 DIAGNOSIS — M86652 Other chronic osteomyelitis, left thigh: Secondary | ICD-10-CM

## 2020-01-13 ENCOUNTER — Encounter (HOSPITAL_COMMUNITY): Payer: Self-pay | Admitting: *Deleted

## 2020-01-13 ENCOUNTER — Emergency Department (HOSPITAL_COMMUNITY)
Admission: EM | Admit: 2020-01-13 | Discharge: 2020-01-13 | Disposition: A | Discharge status: Home / Self Care | Payer: No Typology Code available for payment source | Attending: Emergency Medicine | Admitting: Emergency Medicine

## 2020-01-13 ENCOUNTER — Other Ambulatory Visit: Payer: Self-pay

## 2020-01-13 ENCOUNTER — Emergency Department (HOSPITAL_COMMUNITY): Payer: No Typology Code available for payment source

## 2020-01-13 DIAGNOSIS — Z933 Colostomy status: Secondary | ICD-10-CM | POA: Insufficient documentation

## 2020-01-13 DIAGNOSIS — Z87442 Personal history of urinary calculi: Secondary | ICD-10-CM | POA: Diagnosis not present

## 2020-01-13 DIAGNOSIS — R1013 Epigastric pain: Secondary | ICD-10-CM | POA: Diagnosis not present

## 2020-01-13 DIAGNOSIS — R109 Unspecified abdominal pain: Secondary | ICD-10-CM | POA: Diagnosis present

## 2020-01-13 DIAGNOSIS — I10 Essential (primary) hypertension: Secondary | ICD-10-CM | POA: Insufficient documentation

## 2020-01-13 DIAGNOSIS — Z87891 Personal history of nicotine dependence: Secondary | ICD-10-CM | POA: Insufficient documentation

## 2020-01-13 LAB — CBC
HCT: 47.7 % (ref 39.0–52.0)
Hemoglobin: 14.9 g/dL (ref 13.0–17.0)
MCH: 30.8 pg (ref 26.0–34.0)
MCHC: 31.2 g/dL (ref 30.0–36.0)
MCV: 98.6 fL (ref 80.0–100.0)
Platelets: 233 10*3/uL (ref 150–400)
RBC: 4.84 MIL/uL (ref 4.22–5.81)
RDW: 12.6 % (ref 11.5–15.5)
WBC: 9.4 10*3/uL (ref 4.0–10.5)
nRBC: 0 % (ref 0.0–0.2)

## 2020-01-13 LAB — URINALYSIS, ROUTINE W REFLEX MICROSCOPIC
Glucose, UA: NEGATIVE mg/dL
Ketones, ur: 5 mg/dL — AB
Nitrite: NEGATIVE
Protein, ur: 100 mg/dL — AB
RBC / HPF: >50 RBC/hpf — ABNORMAL HIGH (ref 0–5)
Specific Gravity, Urine: 1.025 (ref 1.005–1.030)
WBC, UA: >50 WBC/hpf — ABNORMAL HIGH (ref 0–5)
pH: 5 (ref 5.0–8.0)

## 2020-01-13 LAB — COMPREHENSIVE METABOLIC PANEL
ALT: 69 U/L — ABNORMAL HIGH (ref 0–44)
AST: 35 U/L (ref 15–41)
Albumin: 3.6 g/dL (ref 3.5–5.0)
Alkaline Phosphatase: 161 U/L — ABNORMAL HIGH (ref 38–126)
Anion gap: 13 (ref 5–15)
BUN: 16 mg/dL (ref 6–20)
CO2: 21 mmol/L — ABNORMAL LOW (ref 22–32)
Calcium: 9.8 mg/dL (ref 8.9–10.3)
Chloride: 104 mmol/L (ref 98–111)
Creatinine, Ser: 0.67 mg/dL (ref 0.61–1.24)
GFR calc Af Amer: >60 mL/min (ref >60)
GFR calc non Af Amer: >60 mL/min (ref >60)
Glucose, Bld: 136 mg/dL — ABNORMAL HIGH (ref 70–99)
Potassium: 3.8 mmol/L (ref 3.5–5.1)
Sodium: 138 mmol/L (ref 135–145)
Total Bilirubin: 0.8 mg/dL (ref 0.3–1.2)
Total Protein: 7.8 g/dL (ref 6.5–8.1)

## 2020-01-13 LAB — LIPASE, BLOOD: Lipase: 18 U/L (ref 11–51)

## 2020-01-13 MED ORDER — HYDROMORPHONE HCL 1 MG/ML IJ SOLN
1.0000 mg | Freq: Once | INTRAMUSCULAR | Status: AC
  Administered 2020-01-13: 1 mg via INTRAVENOUS
  Filled 2020-01-13: qty 1

## 2020-01-13 MED ORDER — LACTATED RINGERS IV BOLUS
1000.0000 mL | Freq: Once | INTRAVENOUS | Status: AC
  Administered 2020-01-13: 1000 mL via INTRAVENOUS

## 2020-01-13 MED ORDER — FAMOTIDINE 20 MG PO TABS
20.0000 mg | ORAL_TABLET | Freq: Once | ORAL | Status: AC
  Administered 2020-01-13: 20 mg via ORAL
  Filled 2020-01-13: qty 1

## 2020-01-13 MED ORDER — SUCRALFATE 1 G PO TABS
1.0000 g | ORAL_TABLET | Freq: Once | ORAL | Status: AC
  Administered 2020-01-13: 1 g via ORAL
  Filled 2020-01-13: qty 1

## 2020-01-13 MED ORDER — ONDANSETRON HCL 4 MG/2ML IJ SOLN
4.0000 mg | Freq: Once | INTRAMUSCULAR | Status: AC
  Administered 2020-01-13: 4 mg via INTRAVENOUS
  Filled 2020-01-13: qty 2

## 2020-01-13 MED ORDER — IOHEXOL 300 MG/ML  SOLN
100.0000 mL | Freq: Once | INTRAMUSCULAR | Status: AC | PRN
  Administered 2020-01-13: 100 mL via INTRAVENOUS
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
## 2020-01-13 NOTE — ED Notes (Signed)
Patient transported to CT 

## 2020-01-13 NOTE — ED Notes (Signed)
Ptar called 

## 2020-01-13 NOTE — ED Notes (Signed)
Discharge instructions discussed with pt. Pt verbalized understanding with no questions at this time. Pt going home via [facility]
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
## 2020-01-13 NOTE — ED Notes (Signed)
Patient transported to Ultrasound 

## 2020-01-14 ENCOUNTER — Telehealth: Payer: Self-pay

## 2020-01-14 ENCOUNTER — Telehealth: Payer: Self-pay | Admitting: Plastic Surgery

## 2020-01-14 NOTE — Telephone Encounter (Signed)
Called patient's wife back to schedule appointment with provider in office. Patient unable to come in on Monday due to court. Will schedule Tuesday 4/10 with Carver Fila, FNP.  Understands if patient gets worse will need to go to ED.  Mrs. Manson Passey would also like to inform MD of some swelling above hip. Is not hot to the touch or red.  Lorenso Courier, [place]

## 2020-01-14 NOTE — Telephone Encounter (Signed)
Patient's wife called office today to inform MD that Christopher [name] was seen at ED yesterday. Pt was complaining of hip and stomach pain as well as nausea. States he is on Fenatyl patch and Oxycodone, but had no relief.  Christopher [name] would like to know what she should do for patient. Is still complaining about pain. Is scheduled for follow up on 8/18. Christopher [name], [place]

## 2020-01-14 NOTE — Telephone Encounter (Signed)
Thanks Cici  THey should also see Dr. Jena Gauss their surgeon

## 2020-01-14 NOTE — Telephone Encounter (Signed)
I dotn think there is much I can do from where I am. If he is admitted (and I know he doesn't want to be admitted then we can better sort out what is going on. Can any provider in clinic see him?

## 2020-01-14 NOTE — Telephone Encounter (Signed)
Patient's wife called to say she had patient taken to ER because he had a lot of pain in his stomach and hip and she couldn't control it. They sent him home because they didn't see anything, but the area above the wound is swollen. She said she called Infectious Disease as well. She wanted to see what Dr. Ulice Bold thinks she should do? Should she bring him in sooner than his appt? Please call her to advise.

## 2020-01-14 NOTE — Telephone Encounter (Addendum)
Spoke with Dr. Ulice Bold regarding the message below, and she stated to have the patient come in this week.  Patient was called and an appointment was scheduled for (Friday-01/15/20).//AB/CMA

## 2020-01-15 ENCOUNTER — Other Ambulatory Visit: Payer: Self-pay | Admitting: Family Medicine

## 2020-01-16 LAB — URINE CULTURE: Culture: 60000 — AB

## 2020-01-17 ENCOUNTER — Ambulatory Visit (INDEPENDENT_AMBULATORY_CARE_PROVIDER_SITE_OTHER): Payer: No Typology Code available for payment source | Admitting: Plastic Surgery

## 2020-01-17 ENCOUNTER — Telehealth (INDEPENDENT_AMBULATORY_CARE_PROVIDER_SITE_OTHER): Payer: No Typology Code available for payment source | Admitting: Plastic Surgery

## 2020-01-17 ENCOUNTER — Encounter: Payer: Self-pay | Admitting: Plastic Surgery

## 2020-01-17 ENCOUNTER — Other Ambulatory Visit: Payer: Self-pay

## 2020-01-17 VITALS — BP 141/85 | HR 70 | Temp 97.7°F

## 2020-01-17 DIAGNOSIS — T148XXA Other injury of unspecified body region, initial encounter: Secondary | ICD-10-CM

## 2020-01-17 DIAGNOSIS — L089 Local infection of the skin and subcutaneous tissue, unspecified: Secondary | ICD-10-CM

## 2020-01-17 MED ORDER — VALACYCLOVIR HCL 1 G PO TABS: 1000.0000 mg | ORAL_TABLET | Freq: Three times a day (TID) | ORAL | 0 refills | Status: AC

## 2020-01-17 NOTE — Telephone Encounter (Signed)
Sent Valtrex TID for 7 days to pharmacy for patient for shingles.

## 2020-01-17 NOTE — Telephone Encounter (Signed)
Called and LMOM @ 3:12pm informing the wife [name]) that prescription for the patient has been sent to the pharmacy ([facility]).  Asked her to give the pharmacy a call to make sure they have it ready.//AB/CMA

## 2020-01-17 NOTE — Telephone Encounter (Signed)
[name], wife of patient, called to advise the prescription for shingles had not been called in yet to the pharmacy and she wanted to follow up. Please call Melanie at [phone] to advise.

## 2020-01-17 NOTE — Progress Notes (Signed)
   Subjective:    Patient ID: Christopher [name], male    DOB: 03-Aug-1965, 54 y.o.   MRN: [id number]  The patient is a 54 year old man here with his social worker and his wife.  He has had an increase in his pain level over the last several days to almost 2 weeks.  At one point he went to the emergency room.  He had a CT scan and an ultrasound.  Dilated common bile duct but nothing else new.  He has a little bit of outbreak on his back but nothing that looks like herpes zoster.  His pain level is such that even the lightest of touch is extremely painful for him on the left back and buttock.  Not on the right.  No major change in the wound.  It actually looks pretty good.  The size is approximately 5 x 5 mm  Review of Systems  Constitutional: Positive for activity change. Negative for appetite change.  Eyes: Negative.   Respiratory: Negative for chest tightness.   Endocrine: Negative.   Genitourinary: Negative.   Psychiatric/Behavioral: Negative.        Objective:   Physical Exam Vitals and nursing note reviewed.  Constitutional:      Appearance: Normal appearance.  HENT:     Head: Normocephalic.  Cardiovascular:     Rate and Rhythm: Normal rate.     Pulses: Normal pulses.  Pulmonary:     Effort: Pulmonary effort is normal.  Skin:    General: Skin is warm.     Capillary Refill: Capillary refill takes less than 2 seconds.  Neurological:     Mental Status: He is alert. Mental status is at baseline.  Psychiatric:        Mood and Affect: Mood normal.        Behavior: Behavior normal.        Thought Content: Thought content normal.        Assessment & Plan:     ICD-10-CM   1. Wound infection, posttraumatic  T14.8XXA    L08.9     I discussed options with Dr. Synthia Innocent.  There is a chance that this could be the start of a herpes zoster.  We both feel that it is worth considering Valtrex.  The patient definitely wants to do this for a few days.  He is willing to try anything to  help with his pain level.  He does have an appointment scheduled with [facility] in the next 3 weeks.  His wife will definitely call and let me know the results.  He is scheduled to see me at the end of the month.  Prescription sent to pharmacy.

## 2020-01-18 ENCOUNTER — Telehealth (HOSPITAL_BASED_OUTPATIENT_CLINIC_OR_DEPARTMENT_OTHER): Payer: Self-pay | Admitting: Emergency Medicine

## 2020-01-18 NOTE — Telephone Encounter (Signed)
Post ED Visit - Positive Culture Follow-up  Culture report reviewed by antimicrobial stewardship pharmacist: [facility] []  , Pharm.D. []  Enzo Bi, Pharm.D., BCPS AQ-ID []  , Pharm.D., BCPS []  Celedonio Miyamoto, .D., BCPS []  [place], .D., BCPS, AAHIVP []  Georgina Pillion, Pharm.D., BCPS, AAHIVP []  [address], PharmD, BCPS []  , PharmD, BCPS []  [place], PharmD, BCPS [x]  [address], PharmD []  , PharmD, BCPS []  Estella Husk, PharmD  Pharmacy Team []  Lysle Pearl, PharmD []  , PharmD []  Phillips Climes, PharmD []  , Rph []  Agapito Games) , PharmD []  Lamar Sprinkles, PharmD []  , PharmD []  Mervyn Gay, PharmD []  , PharmD []  Vinnie Level, PharmD []  [facility], PharmD []  , PharmD []  Len Childs, PharmD   Positive urine culture No further patient follow-up is required at this time.  01/18/2020, 5:39 PM

## 2020-01-20 ENCOUNTER — Telehealth: Payer: Self-pay | Admitting: *Deleted

## 2020-01-20 MED ORDER — FENTANYL 50 MCG/HR TD PT72: 1.0000 | MEDICATED_PATCH | TRANSDERMAL | 0 refills | Status: DC

## 2020-01-20 NOTE — Telephone Encounter (Signed)
Christopher [name] called to request a refill on [name] Fentanyl patches.  Per PMP it was last filled on 12/19/19.

## 2020-01-20 NOTE — Telephone Encounter (Signed)
Refilled 50 mcg/hr duragesic patch #15 for 1 month

## 2020-01-20 NOTE — Telephone Encounter (Signed)
Notified. 

## 2020-01-21 ENCOUNTER — Other Ambulatory Visit: Payer: Self-pay

## 2020-01-21 ENCOUNTER — Encounter: Payer: Self-pay | Admitting: Family

## 2020-01-21 ENCOUNTER — Ambulatory Visit (INDEPENDENT_AMBULATORY_CARE_PROVIDER_SITE_OTHER): Payer: No Typology Code available for payment source | Admitting: Family

## 2020-01-21 DIAGNOSIS — Z1624 Resistance to multiple antibiotics: Secondary | ICD-10-CM | POA: Diagnosis not present

## 2020-01-21 DIAGNOSIS — M869 Osteomyelitis, unspecified: Secondary | ICD-10-CM

## 2020-01-21 DIAGNOSIS — M8618 Other acute osteomyelitis, other site: Secondary | ICD-10-CM | POA: Diagnosis not present

## 2020-01-21 DIAGNOSIS — B965 Pseudomonas (aeruginosa) (mallei) (pseudomallei) as the cause of diseases classified elsewhere: Secondary | ICD-10-CM | POA: Diagnosis not present
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
## 2020-01-21 NOTE — Patient Instructions (Signed)
Nice to see you.  We will vancomycin, ciprofloxacin, and oral vancomycin.  Follow up with [facility] as scheduled for possible surgical intervention.   Plan for follow up with Dr. Daiva Eves  In about 4 weeks or sooner if needed.   Have a great day and stay safe!

## 2020-01-23 NOTE — Assessment & Plan Note (Addendum)
Christopher [name] had an exacerbation of pain about 1 week ago that appears to have improved since previously being seen by Plastic Surgery. Decided not to take valacyclovir. This is a tough situation given his burden of infection. The pain he is experiencing is likely to be related to his osteomyelitis which is complicated by multi-drug resistant Pseudomonas. Pain control is going to remain important while continuing to take the antibiotics. He has appointment with [facility] to see about any surgical interventions. Recommend continuing with wound care and current dose of vancomycin, ciprofloxacin and oral vancomycin. Will likely require prolonged suppression at the completion of his treatment. Plan for follow up with Dr. Daiva Eves in 1 month or sooner if needed.

## 2020-01-25 ENCOUNTER — Other Ambulatory Visit: Payer: Self-pay | Admitting: Family Medicine

## 2020-01-27 ENCOUNTER — Emergency Department (HOSPITAL_COMMUNITY): Payer: No Typology Code available for payment source | Attending: Medical

## 2020-01-27 ENCOUNTER — Emergency Department (HOSPITAL_COMMUNITY): Payer: Self-pay

## 2020-01-27 ENCOUNTER — Other Ambulatory Visit: Payer: Self-pay

## 2020-01-27 ENCOUNTER — Inpatient Hospital Stay (HOSPITAL_COMMUNITY)
Admission: EM | Admit: 2020-01-27 | Discharge: 2020-01-28 | DRG: 205 | Disposition: A | Discharge status: Home / Self Care | Payer: No Typology Code available for payment source | Attending: Internal Medicine | Admitting: Internal Medicine

## 2020-01-27 ENCOUNTER — Emergency Department (HOSPITAL_COMMUNITY): Payer: No Typology Code available for payment source

## 2020-01-27 DIAGNOSIS — J189 Pneumonia, unspecified organism: Secondary | ICD-10-CM

## 2020-01-27 DIAGNOSIS — Z79899 Other long term (current) drug therapy: Secondary | ICD-10-CM | POA: Diagnosis not present

## 2020-01-27 DIAGNOSIS — Z86718 Personal history of other venous thrombosis and embolism: Secondary | ICD-10-CM

## 2020-01-27 DIAGNOSIS — L89154 Pressure ulcer of sacral region, stage 4: Secondary | ICD-10-CM | POA: Diagnosis present

## 2020-01-27 DIAGNOSIS — Z87442 Personal history of urinary calculi: Secondary | ICD-10-CM

## 2020-01-27 DIAGNOSIS — Z803 Family history of malignant neoplasm of breast: Secondary | ICD-10-CM | POA: Diagnosis not present

## 2020-01-27 DIAGNOSIS — M8668 Other chronic osteomyelitis, other site: Secondary | ICD-10-CM | POA: Diagnosis present

## 2020-01-27 DIAGNOSIS — Z993 Dependence on wheelchair: Secondary | ICD-10-CM

## 2020-01-27 DIAGNOSIS — R0789 Other chest pain: Secondary | ICD-10-CM | POA: Diagnosis present

## 2020-01-27 DIAGNOSIS — Z87891 Personal history of nicotine dependence: Secondary | ICD-10-CM

## 2020-01-27 DIAGNOSIS — Z20822 Contact with and (suspected) exposure to covid-19: Secondary | ICD-10-CM | POA: Diagnosis present

## 2020-01-27 DIAGNOSIS — I110 Hypertensive heart disease with heart failure: Principal | ICD-10-CM | POA: Diagnosis present

## 2020-01-27 DIAGNOSIS — G894 Chronic pain syndrome: Secondary | ICD-10-CM | POA: Diagnosis present

## 2020-01-27 DIAGNOSIS — Z792 Long term (current) use of antibiotics: Secondary | ICD-10-CM

## 2020-01-27 DIAGNOSIS — Z933 Colostomy status: Secondary | ICD-10-CM

## 2020-01-27 DIAGNOSIS — I5031 Acute diastolic (congestive) heart failure: Secondary | ICD-10-CM | POA: Diagnosis present

## 2020-01-27 DIAGNOSIS — J9811 Atelectasis: Secondary | ICD-10-CM | POA: Diagnosis present

## 2020-01-27 DIAGNOSIS — Z7901 Long term (current) use of anticoagulants: Secondary | ICD-10-CM

## 2020-01-27 DIAGNOSIS — I509 Heart failure, unspecified: Secondary | ICD-10-CM

## 2020-01-27 DIAGNOSIS — J9601 Acute respiratory failure with hypoxia: Secondary | ICD-10-CM | POA: Diagnosis present

## 2020-01-27 DIAGNOSIS — Z79891 Long term (current) use of opiate analgesic: Secondary | ICD-10-CM | POA: Diagnosis not present

## 2020-01-27 DIAGNOSIS — F418 Other specified anxiety disorders: Secondary | ICD-10-CM | POA: Diagnosis present

## 2020-01-27 LAB — CBC
HCT: 46.6 % (ref 39.0–52.0)
Hemoglobin: 14.5 g/dL (ref 13.0–17.0)
MCH: 30.9 pg (ref 26.0–34.0)
MCHC: 31.1 g/dL (ref 30.0–36.0)
MCV: 99.4 fL (ref 80.0–100.0)
Platelets: 224 10*3/uL (ref 150–400)
RBC: 4.69 MIL/uL (ref 4.22–5.81)
RDW: 12.8 % (ref 11.5–15.5)
WBC: 9.1 10*3/uL (ref 4.0–10.5)
nRBC: 0 % (ref 0.0–0.2)

## 2020-01-27 LAB — HEPATIC FUNCTION PANEL
ALT: 100 U/L — ABNORMAL HIGH (ref 0–44)
AST: 39 U/L (ref 15–41)
Albumin: 3.6 g/dL (ref 3.5–5.0)
Alkaline Phosphatase: 195 U/L — ABNORMAL HIGH (ref 38–126)
Bilirubin, Direct: 0.2 mg/dL (ref 0.0–0.2)
Indirect Bilirubin: 0.7 mg/dL (ref 0.3–0.9)
Total Bilirubin: 0.9 mg/dL (ref 0.3–1.2)
Total Protein: 7.8 g/dL (ref 6.5–8.1)

## 2020-01-27 LAB — URINALYSIS, ROUTINE W REFLEX MICROSCOPIC
Bilirubin Urine: NEGATIVE
Glucose, UA: NEGATIVE mg/dL
Ketones, ur: NEGATIVE mg/dL
Nitrite: NEGATIVE
Protein, ur: NEGATIVE mg/dL
Specific Gravity, Urine: 1.003 — ABNORMAL LOW (ref 1.005–1.030)
WBC, UA: >50 WBC/hpf — ABNORMAL HIGH (ref 0–5)
pH: 5 (ref 5.0–8.0)

## 2020-01-27 LAB — BASIC METABOLIC PANEL
Anion gap: 10 (ref 5–15)
BUN: 15 mg/dL (ref 6–20)
CO2: 22 mmol/L (ref 22–32)
Calcium: 9.9 mg/dL (ref 8.9–10.3)
Chloride: 103 mmol/L (ref 98–111)
Creatinine, Ser: 0.62 mg/dL (ref 0.61–1.24)
GFR calc Af Amer: >60 mL/min (ref >60)
GFR calc non Af Amer: >60 mL/min (ref >60)
Glucose, Bld: 120 mg/dL — ABNORMAL HIGH (ref 70–99)
Potassium: 4.3 mmol/L (ref 3.5–5.1)
Sodium: 135 mmol/L (ref 135–145)

## 2020-01-27 LAB — SARS CORONAVIRUS 2 BY RT PCR (HOSPITAL ORDER, PERFORMED IN ~~LOC~~ HOSPITAL LAB): SARS Coronavirus 2: NEGATIVE

## 2020-01-27 LAB — LIPASE, BLOOD: Lipase: 22 U/L (ref 11–51)

## 2020-01-27 LAB — TROPONIN I (HIGH SENSITIVITY)
Troponin I (High Sensitivity): 3 ng/L (ref <18)
Troponin I (High Sensitivity): 4 ng/L (ref <18)

## 2020-01-27 MED ORDER — QUETIAPINE FUMARATE 50 MG PO TABS
50.0000 mg | ORAL_TABLET | Freq: Every day | ORAL | Status: DC
  Administered 2020-01-27: 50 mg via ORAL
  Filled 2020-01-27: qty 1

## 2020-01-27 MED ORDER — VANCOMYCIN 50 MG/ML ORAL SOLUTION
125.0000 mg | Freq: Four times a day (QID) | ORAL | Status: DC
  Administered 2020-01-27 – 2020-01-28 (×2): 125 mg via ORAL
  Filled 2020-01-27 (×6): qty 2.5

## 2020-01-27 MED ORDER — PROMETHAZINE HCL 25 MG/ML IJ SOLN
12.5000 mg | Freq: Once | INTRAMUSCULAR | Status: AC
  Administered 2020-01-27: 12.5 mg via INTRAVENOUS
  Filled 2020-01-27: qty 1

## 2020-01-27 MED ORDER — VANCOMYCIN HCL IN DEXTROSE 1-5 GM/200ML-% IV SOLN
1000.0000 mg | Freq: Three times a day (TID) | INTRAVENOUS | Status: DC
  Administered 2020-01-27 – 2020-01-28 (×3): 1000 mg via INTRAVENOUS
  Filled 2020-01-27 (×2): qty 200

## 2020-01-27 MED ORDER — SODIUM CHLORIDE 0.9 % IV SOLN: 250.0000 mL | INTRAVENOUS | Status: DC | PRN

## 2020-01-27 MED ORDER — RISAQUAD PO CAPS
1.0000 | ORAL_CAPSULE | Freq: Every day | ORAL | Status: DC
  Filled 2020-01-27: qty 1

## 2020-01-27 MED ORDER — MELATONIN 3 MG PO TABS
3.0000 mg | ORAL_TABLET | Freq: Every day | ORAL | Status: DC
  Administered 2020-01-27: 3 mg via ORAL
  Filled 2020-01-27: qty 1

## 2020-01-27 MED ORDER — OXYCODONE-ACETAMINOPHEN 5-325 MG PO TABS
1.0000 | ORAL_TABLET | ORAL | Status: DC | PRN
  Administered 2020-01-27 – 2020-01-28 (×4): 1 via ORAL
  Filled 2020-01-27 (×5): qty 1

## 2020-01-27 MED ORDER — VANCOMYCIN HCL 1000 MG/200ML IV SOLN: 1000.0000 mg | Freq: Three times a day (TID) | INTRAVENOUS | Status: DC

## 2020-01-27 MED ORDER — MIRABEGRON ER 50 MG PO TB24
50.0000 mg | ORAL_TABLET | Freq: Every day | ORAL | Status: DC
  Administered 2020-01-28: 50 mg via ORAL
  Filled 2020-01-27 (×2): qty 1

## 2020-01-27 MED ORDER — ADULT MULTIVITAMIN W/MINERALS CH
1.0000 | ORAL_TABLET | Freq: Every day | ORAL | Status: DC
  Administered 2020-01-28: 1 via ORAL
  Filled 2020-01-27: qty 1

## 2020-01-27 MED ORDER — PAROXETINE HCL 20 MG PO TABS
20.0000 mg | ORAL_TABLET | Freq: Every day | ORAL | Status: DC
  Administered 2020-01-27: 20 mg via ORAL
  Filled 2020-01-27: qty 1

## 2020-01-27 MED ORDER — IOHEXOL 350 MG/ML SOLN
100.0000 mL | Freq: Once | INTRAVENOUS | Status: AC | PRN
  Administered 2020-01-27: 100 mL via INTRAVENOUS

## 2020-01-27 MED ORDER — AMLODIPINE BESYLATE 5 MG PO TABS
5.0000 mg | ORAL_TABLET | Freq: Every day | ORAL | Status: DC
  Administered 2020-01-27 – 2020-01-28 (×2): 5 mg via ORAL
  Filled 2020-01-27 (×2): qty 1

## 2020-01-27 MED ORDER — SODIUM CHLORIDE 0.9 % IV SOLN
1.0000 g | Freq: Once | INTRAVENOUS | Status: AC
  Administered 2020-01-27: 1 g via INTRAVENOUS
  Filled 2020-01-27: qty 10

## 2020-01-27 MED ORDER — FENTANYL CITRATE (PF) 100 MCG/2ML IJ SOLN
100.0000 ug | Freq: Once | INTRAMUSCULAR | Status: AC
  Administered 2020-01-27: 100 ug via INTRAMUSCULAR
  Filled 2020-01-27: qty 2

## 2020-01-27 MED ORDER — PROBIOTIC COLON SUPPORT PO CAPS: 1.0000 | ORAL_CAPSULE | Freq: Every day | ORAL | Status: DC

## 2020-01-27 MED ORDER — METHOCARBAMOL 500 MG PO TABS
500.0000 mg | ORAL_TABLET | Freq: Four times a day (QID) | ORAL | Status: DC | PRN
  Administered 2020-01-27 – 2020-01-28 (×2): 500 mg via ORAL
  Filled 2020-01-27 (×2): qty 1

## 2020-01-27 MED ORDER — SODIUM CHLORIDE 0.9% FLUSH: 3.0000 mL | INTRAVENOUS | Status: DC | PRN

## 2020-01-27 MED ORDER — PRO-STAT SUGAR FREE PO LIQD
30.0000 mL | Freq: Three times a day (TID) | ORAL | Status: DC
  Administered 2020-01-28: 30 mL via ORAL
  Filled 2020-01-27 (×5): qty 30

## 2020-01-27 MED ORDER — DM-GUAIFENESIN ER 30-600 MG PO TB12
2.0000 | ORAL_TABLET | Freq: Two times a day (BID) | ORAL | Status: DC
  Administered 2020-01-27 – 2020-01-28 (×2): 2 via ORAL
  Filled 2020-01-27 (×4): qty 2

## 2020-01-27 MED ORDER — SENNA 8.6 MG PO TABS
2.0000 | ORAL_TABLET | Freq: Every day | ORAL | Status: DC
  Administered 2020-01-27: 17.2 mg via ORAL
  Filled 2020-01-27: qty 2

## 2020-01-27 MED ORDER — ONDANSETRON HCL 4 MG PO TABS: 4.0000 mg | ORAL_TABLET | Freq: Three times a day (TID) | ORAL | Status: DC | PRN

## 2020-01-27 MED ORDER — ONDANSETRON 4 MG PO TBDP
4.0000 mg | ORAL_TABLET | Freq: Once | ORAL | Status: AC
  Administered 2020-01-27: 4 mg via ORAL
  Filled 2020-01-27: qty 1

## 2020-01-27 MED ORDER — SODIUM CHLORIDE 0.9 % IV SOLN
500.0000 mg | Freq: Once | INTRAVENOUS | Status: AC
  Administered 2020-01-27: 500 mg via INTRAVENOUS
  Filled 2020-01-27: qty 500

## 2020-01-27 MED ORDER — ONDANSETRON HCL 4 MG/2ML IJ SOLN: 4.0000 mg | Freq: Once | INTRAMUSCULAR | Status: DC

## 2020-01-27 MED ORDER — FUROSEMIDE 10 MG/ML IJ SOLN
20.0000 mg | Freq: Once | INTRAMUSCULAR | Status: AC
  Administered 2020-01-27: 20 mg via INTRAVENOUS
  Filled 2020-01-27: qty 2

## 2020-01-27 MED ORDER — SODIUM CHLORIDE 0.9% FLUSH
3.0000 mL | Freq: Two times a day (BID) | INTRAVENOUS | Status: DC
  Administered 2020-01-28: 3 mL via INTRAVENOUS

## 2020-01-27 MED ORDER — HYDROMORPHONE HCL 1 MG/ML IJ SOLN
1.0000 mg | Freq: Once | INTRAMUSCULAR | Status: AC
  Administered 2020-01-27: 1 mg via INTRAVENOUS
  Filled 2020-01-27: qty 1

## 2020-01-27 MED ORDER — ACETAMINOPHEN 325 MG PO TABS: 650.0000 mg | ORAL_TABLET | ORAL | Status: DC | PRN

## 2020-01-27 MED ORDER — GABAPENTIN 300 MG PO CAPS
300.0000 mg | ORAL_CAPSULE | Freq: Three times a day (TID) | ORAL | Status: DC
  Administered 2020-01-27 – 2020-01-28 (×3): 300 mg via ORAL
  Filled 2020-01-27 (×3): qty 1

## 2020-01-27 MED ORDER — AMLODIPINE 1 MG/ML ORAL SUSPENSION: 5.0000 mg | Freq: Every day | ORAL | Status: DC

## 2020-01-27 MED ORDER — ALBUTEROL SULFATE (2.5 MG/3ML) 0.083% IN NEBU: 3.0000 mL | INHALATION_SOLUTION | Freq: Four times a day (QID) | RESPIRATORY_TRACT | Status: DC | PRN

## 2020-01-27 MED ORDER — APIXABAN 5 MG PO TABS
5.0000 mg | ORAL_TABLET | Freq: Two times a day (BID) | ORAL | Status: DC
  Administered 2020-01-27 – 2020-01-28 (×2): 5 mg via ORAL
  Filled 2020-01-27 (×3): qty 1

## 2020-01-27 MED ORDER — FENTANYL 50 MCG/HR TD PT72
1.0000 | MEDICATED_PATCH | TRANSDERMAL | Status: DC
  Administered 2020-01-28: 1 via TRANSDERMAL
  Filled 2020-01-27: qty 1

## 2020-01-27 MED ORDER — ONDANSETRON HCL 4 MG/2ML IJ SOLN: 4.0000 mg | Freq: Four times a day (QID) | INTRAMUSCULAR | Status: DC | PRN

## 2020-01-27 MED ORDER — SODIUM CHLORIDE 0.9 % IV BOLUS
1000.0000 mL | Freq: Once | INTRAVENOUS | Status: AC
  Administered 2020-01-27: 1000 mL via INTRAVENOUS

## 2020-01-27 MED ORDER — CARVEDILOL 3.125 MG PO TABS
3.1250 mg | ORAL_TABLET | Freq: Two times a day (BID) | ORAL | Status: DC
  Administered 2020-01-28 (×2): 3.125 mg via ORAL
  Filled 2020-01-27 (×2): qty 1

## 2020-01-27 MED ORDER — CIPROFLOXACIN HCL 500 MG PO TABS
500.0000 mg | ORAL_TABLET | Freq: Two times a day (BID) | ORAL | Status: DC
  Administered 2020-01-27 – 2020-01-28 (×2): 500 mg via ORAL
  Filled 2020-01-27 (×3): qty 1

## 2020-01-27 MED ORDER — CLONAZEPAM 0.25 MG PO TBDP
0.2500 mg | ORAL_TABLET | Freq: Three times a day (TID) | ORAL | Status: DC | PRN
  Administered 2020-01-27: 0.25 mg via ORAL
  Filled 2020-01-27: qty 1

## 2020-01-27 MED ORDER — OXYBUTYNIN CHLORIDE 5 MG PO TABS
5.0000 mg | ORAL_TABLET | Freq: Three times a day (TID) | ORAL | Status: DC
  Administered 2020-01-27 – 2020-01-28 (×2): 5 mg via ORAL
  Filled 2020-01-27 (×4): qty 1

## 2020-01-27 NOTE — H&P (Signed)
History and Physical    Christopher [name] QTM:[id number] DOB: [birth year] DOA: 01/27/2020  PCP: Vivi Barrack, MD (Confirm with patient/family/NH records and if not entered, this has to be entered at [facility] point of entry) Patient coming from: Home  I have personally briefly reviewed patient's old medical records in [place]  Chief Complaint: SOB  HPI: Christopher [name] is a 54 y.o. 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  Patient has had worsening of shortness of breath for several month, he reported this week even the minimum walking distance for 2 to 3 minutes will cause significant short of breath, he also has cough sometimes with clear sputum production.  Denies any orthopnea.  No fever chills.  This morning he developed left-sided sharp like child's pain constant, lasted for few hours denied any short of breath no nausea or vomit no palpitation no sweating. ED Course: CT angiogram negative for PE, suspicious for atelectasis versus infiltrates and small left-sided pleural effusion.  Patient blood pressure significant elevated in ED with initial presentation systolic more than 625 and diastolic more than 638, or time diastolic blood pressure remain high more than 100.  Tachycardia, EKG showed no ST changes 2 sets troponin negative  Review of Systems: As per HPI otherwise 14 point review of systems negative.    Past Medical History:  Diagnosis Date  . Acute on chronic respiratory failure with hypoxia (Christopher [name]) 06/2018   trach removed 11-16-2018, on vent from jan until may 2020 - uses albuterol prn  . Anxiety   . Bacteremia due to Pseudomonas 06/2018  . Chronic osteomyelitis  (Christopher [name])   . Chronic pain syndrome   . Clostridium difficile colitis 10/30/2019   tx with abx   . Depression   . DVT (deep venous thrombosis) (Christopher [name]) 2020   right brachial post PICC line  . History of blood transfusion 06/2018  . History of kidney stones   . Hypertension    norvasc d/c by pcp on 11/05/19  . Multiple traumatic injuries   . Penile pain 11/18/2019  . Pneumonia 11/2009   2020 x 2  . Gilford Rile as ambulation aid   . Wheelchair bound    electric  . Wound discharge    left hip wound with bloody/clear drainage change dressing q day surgilube with gauze, between legs wound using calcium algenate pad bid    Past Surgical History:  Procedure Laterality Date  . APPLICATION OF A-CELL OF BACK N/A 08/06/2018   Procedure: Application Of A-Cell Of Back;  Surgeon: Christopher Going, DO;  Location: [place];  Service: Plastics;  Laterality: N/A;  . APPLICATION OF A-CELL OF EXTREMITY Left 08/06/2018   Procedure: Application Of A-Cell Of Extremity;  Surgeon: Christopher Going, DO;  Location: [place];  Service: Plastics;  Laterality: Left;  . APPLICATION OF A-CELL OF EXTREMITY Left 09/18/2019   Procedure: APPLICATION OF A-CELL OF EXTREMITY;  Surgeon: Christopher Going, DO;  Location: [place];  Service: Plastics;  Laterality: Left;  . APPLICATION OF WOUND VAC  07/12/2018   Procedure: Application Of Wound Vac to the Left Thigh and Scrotum.;  Surgeon: Christopher Needles, MD;  Location: [place];  Service: Orthopedics;;  . APPLICATION OF WOUND VAC  07/10/2018  Procedure: Application Of Wound Vac;  Surgeon: Christopher Riley, MD;  Location: [place];  Service: General;;  . COLON SURGERY  2020   colostomy  . COLOSTOMY N/A 07/23/2018   Procedure: COLOSTOMY;  Surgeon: Christopher Skeans, MD;  Location: [place];  Service: General;  Laterality: N/A;  . CYSTOSCOPY W/ URETERAL STENT PLACEMENT N/A 07/15/2018   Procedure: RETROGRADE URETHROGRAM;  Surgeon: Christopher Gallo, MD;  Location: [place];  Service: Urology;  Laterality:  N/A;  . CYSTOSCOPY WITH LITHOLAPAXY N/A 05/06/2019   Procedure: CYSTOSCOPY BASKET BLADDER STONE EXTRACTION;  Surgeon: Christopher Gustin, MD;  Location: [facility];  Service: Urology;  Laterality: N/A;  30 MINS  . CYSTOSTOMY N/A 05/06/2019   Procedure: REPLACEMENT OF SUPRAPUBIC CATHETER;  Surgeon: Christopher Gustin, MD;  Location: [facility];  Service: Urology;  Laterality: N/A;  . DEBRIDEMENT AND CLOSURE WOUND Left 03/04/2019   Procedure: Excision of hip wound with placement of Acell;  Surgeon: Christopher Going, DO;  Location: [place];  Service: Plastics;  Laterality: Left;  . ESOPHAGOGASTRODUODENOSCOPY N/A 08/14/2018   Procedure: ESOPHAGOGASTRODUODENOSCOPY (EGD);  Surgeon: Christopher Skeans, MD;  Location: [place];  Service: General;  Laterality: N/A;  bedside  . FACIAL RECONSTRUCTION SURGERY     X 2--once as a teenager and second time in his 35's  . HARDWARE REMOVAL Left 03/04/2019   Procedure: Left Hip Hardware Removal;  Surgeon: Christopher Needles, MD;  Location: [place];  Service: Orthopedics;  Laterality: Left;  . HIP PINNING,CANNULATED Left 07/12/2018   Procedure: CANNULATED HIP PINNING;  Surgeon: Christopher Needles, MD;  Location: [place];  Service: Orthopedics;  Laterality: Left;  . HIP SURGERY    . HOLMIUM LASER APPLICATION Right 09/19/7024   Procedure: HOLMIUM LASER APPLICATION;  Surgeon: Christopher Gustin, MD;  Location: [facility];  Service: Urology;  Laterality: Right;  . I & D EXTREMITY Left 07/25/2018   Procedure: Debridement of buttock, scrotum and left leg, placement of acell and vac;  Surgeon: Christopher Going, DO;  Location: [place];  Service: Plastics;  Laterality: Left;  . I & D EXTREMITY N/A 08/06/2018   Procedure: Debridement of buttock, scrotum and left leg;  Surgeon: Christopher Going, DO;  Location: [place];  Service: Plastics;  Laterality: N/A;  . I & D EXTREMITY N/A 08/13/2018   Procedure: Debridement of buttock, scrotum and  left leg, placement of acell and vac;  Surgeon: Christopher Going, DO;  Location: [place];  Service: Plastics;  Laterality: N/A;  90 min, please  . INCISION AND DRAINAGE HIP Left 09/18/2019   Procedure: IRRIGATION AND DEBRIDEMENT HIP/ PELVIS WITH WOUND VAC PLACEMENT;  Surgeon: Christopher Needles, MD;  Location: [place];  Service: Orthopedics;  Laterality: Left;  . INCISION AND DRAINAGE OF WOUND N/A 07/18/2018   Procedure: Debridement of left leg, buttocks and scrotal wound with placement of acell and Flexiseal;  Surgeon: Christopher Going, DO;  Location: [place];  Service: Plastics;  Laterality: N/A;  . INCISION AND DRAINAGE OF WOUND Left 08/29/2018   Procedure: Debridement of buttock, scrotum and left leg, placement of acell and vac;  Surgeon: Christopher Going, DO;  Location: [place];  Service: Plastics;  Laterality: Left;  75 min, please  . INCISION AND DRAINAGE OF WOUND Bilateral 10/23/2018   Procedure: DEBRIDEMENT OF BUTTOCK,SCROTUM, AND LEG WOUNDS WITH PLACEMENT OF ACELL- BILATERAL 90 MIN;  Surgeon: Christopher Going, DO;  Location: [place];  Service: Plastics;  Laterality: Bilateral;  . IR ANGIOGRAM PELVIS SELECTIVE OR SUPRASELECTIVE  07/10/2018  . IR ANGIOGRAM PELVIS SELECTIVE OR SUPRASELECTIVE  07/10/2018  . IR ANGIOGRAM SELECTIVE EACH ADDITIONAL VESSEL  07/10/2018  . IR EMBO ART  VEN HEMORR LYMPH EXTRAV  INC GUIDE ROADMAPPING  07/10/2018  . IR NEPHROSTOMY PLACEMENT LEFT  04/05/2019  . IR NEPHROSTOMY PLACEMENT RIGHT  05/31/2019  . IR US GUIDE BX ASP/DRAIN  07/10/2018  . IR US GUIDE VASC ACCESS RIGHT  07/10/2018  . IR VENO/EXT/UNI LEFT  07/10/2018  . IRRIGATION AND DEBRIDEMENT OF WOUND WITH SPLIT THICKNESS SKIN GRAFT Left 09/19/2018   Procedure: Debridement of gluteal wound with placement of acell to left leg.;  Surgeon: Christopher Going, DO;  Location: [place];  Service: Plastics;  Laterality: Left;  2.5 hours, please  . LAPAROTOMY N/A 07/12/2018   Procedure: EXPLORATORY LAPAROTOMY;  Surgeon:  Christopher Skeans, MD;  Location: [place];  Service: General;  Laterality: N/A;  . LAPAROTOMY N/A 07/15/2018   Procedure: WOUND EXPLORATION; CLOSURE OF ABDOMEN;  Surgeon: Christopher Skeans, MD;  Location: [place];  Service: General;  Laterality: N/A;  . LAPAROTOMY  07/10/2018   Procedure: Exploratory Laparotomy;  Surgeon: Christopher Riley, MD;  Location: [place];  Service: General;;  . MASS EXCISION Left 09/18/2019   Procedure: EXCISION UPPER LEFT INNER THIGH WOUND;  Surgeon: Christopher Going, DO;  Location: [place];  Service: Plastics;  Laterality: Left;  . NEPHROLITHOTOMY Right 07/15/2019   Procedure: NEPHROLITHOTOMY PERCUTANEOUS;  Surgeon: Christopher Gustin, MD;  Location: [facility];  Service: Urology;  Laterality: Right;  90 MINS  . PEG PLACEMENT N/A 08/14/2018   Procedure: PERCUTANEOUS ENDOSCOPIC GASTROSTOMY (PEG) PLACEMENT;  Surgeon: Christopher Skeans, MD;  Location: [place];  Service: General;  Laterality: N/A;  . PERCUTANEOUS TRACHEOSTOMY N/A 08/02/2018   Procedure: PERCUTANEOUS TRACHEOSTOMY;  Surgeon: Christopher Skeans, MD;  Location: [place];  Service: General;  Laterality: N/A;  . RADIOLOGY WITH ANESTHESIA N/A 07/10/2018   Procedure: IR WITH ANESTHESIA;  Surgeon: Sandi Mariscal, MD;  Location: [place];  Service: Radiology;  Laterality: N/A;  . RADIOLOGY WITH ANESTHESIA Right 07/10/2018   Procedure: Ir With Anesthesia;  Surgeon: Sandi Mariscal, MD;  Location: [place];  Service: Radiology;  Laterality: Right;  . SCROTAL EXPLORATION N/A 07/15/2018   Procedure: SCROTUM DEBRIDEMENT;  Surgeon: Christopher Gallo, MD;  Location: [place];  Service: Urology;  Laterality: N/A;  . SHOULDER SURGERY    . SKIN SPLIT GRAFT Right 09/19/2018   Procedure: Skin Graft Split Thickness;  Surgeon: Christopher Going, DO;  Location: [place];  Service: Plastics;  Laterality: Right;  . SKIN SPLIT GRAFT N/A 10/03/2018   Procedure: Split thickness skin graft to gluteal area with acell placement;  Surgeon: Christopher Going,  DO;  Location: [place];  Service: Plastics;  Laterality: N/A;  3 hours, please  . VACUUM ASSISTED CLOSURE CHANGE N/A 07/12/2018   Procedure: ABDOMINAL VACUUM ASSISTED CLOSURE CHANGE and abdominal washout;  Surgeon: Christopher Skeans, MD;  Location: [place];  Service: General;  Laterality: N/A;  . WOUND DEBRIDEMENT Left 07/23/2018   Procedure: DEBRIDEMENT LEFT BUTTOCK  WOUND;  Surgeon: Christopher Skeans, MD;  Location: [place];  Service: General;  Laterality: Left;  . WOUND EXPLORATION Left 07/10/2018   Procedure: WOUND EXPLORATION LEFT GROIN;  Surgeon: Christopher Riley, MD;  Location: [place];  Service: General;  Laterality: Left;     reports that he quit smoking about 18 months ago. His smoking use included  cigarettes. He has a 20.00 pack-year smoking history. He has never used smokeless tobacco. He reports current drug use. Drugs: Oxycodone and Fentanyl. He reports that he does not drink alcohol.  Allergies  Allergen Reactions  . Omnicef [Cefdinir] Nausea And Vomiting    Family History  Problem Relation Age of Onset  . Breast cancer Mother        with mets to the bones     Prior to Admission medications   Medication Sig Start Date End Date Taking? Authorizing Provider  albuterol (VENTOLIN HFA) 108 (90 Base) MCG/ACT inhaler INHALE 2 PUFFS BY MOUTH EVERY 6 HOURS AS NEEDED FOR WHEEZE OR SHORTNESS OF BREATH 01/15/20   Vivi Barrack, MD  Amino Acids-Protein Hydrolys (FEEDING SUPPLEMENT, PRO-STAT SUGAR FREE 64,) LIQD Take 30 mLs by mouth 3 (three) times daily with meals. Patient taking differently: Take 30 mLs by mouth 2 (two) times daily.  03/26/19   Vivi Barrack, MD  apixaban (ELIQUIS) 5 MG TABS tablet Take 1 tablet (5 mg total) by mouth 2 (two) times daily. 01/27/20   Vivi Barrack, MD  Blood Pressure Monitoring (BLOOD PRESSURE CUFF) MISC Use daily as needed to check blood pressure. 11/05/19   Vivi Barrack, MD  ciprofloxacin (CIPRO) 500 MG tablet Take 1 tablet (500 mg total) by mouth 2 (two)  times daily. 01/01/20   Kuppelweiser, Cassie L, RPH-CPP  clonazePAM (KLONOPIN) 0.5 MG tablet Take 0.5 tablets (0.25 mg total) by mouth 3 (three) times daily as needed for anxiety. Patient taking differently: Take 0.25 mg by mouth in the morning, at noon, and at bedtime.  09/25/19   Lovorn, Jinny Blossom, MD  dextromethorphan-guaiFENesin (MUCINEX DM) 30-600 MG 12hr tablet Take 2 tablets by mouth 2 (two) times daily.    [provider]  dronabinol (MARINOL) 2.5 MG capsule TAKE 1 CAPSULE (2.5 MG TOTAL) BY MOUTH 2 (TWO) TIMES DAILY BEFORE A MEAL. Patient taking differently: Take 2.5 mg by mouth 2 (two) times daily as needed (to encourage appetite).  07/29/19   Lovorn, Jinny Blossom, MD  enoxaparin (LOVENOX) 40 MG/0.4ML injection Inject 0.4 mLs (40 mg total) into the skin daily for 2 days. Patient not taking: Reported on 01/13/2020 12/10/19 01/13/20  Scheeler, Carola Rhine, PA-C  fentaNYL (DURAGESIC) 50 MCG/HR Place 1 patch onto the skin every other day. 12/19/19 12/18/20  Izora Ribas, MD  fentaNYL (DURAGESIC) 50 MCG/HR Place 1 patch onto the skin every other day. 01/20/20   Lovorn, Jinny Blossom, MD  gabapentin (NEURONTIN) 300 MG capsule TAKE 1 CAPSULE BY MOUTH 3 TIMES A DAY Patient taking differently: Take 300 mg by mouth 3 (three) times daily.  10/01/19   Lovorn, Jinny Blossom, MD  lidocaine (LIDODERM) 5 % Place 1 patch onto the skin daily as needed (for pain). Remove & Discard patch within 12 hours or as directed by MD    [provider]  melatonin ([employer] MELATONIN) 3 MG TABS tablet Take 1 tablet (3 mg total) by mouth at bedtime. 11/01/19   Matcha, Beverely Pace, MD  methocarbamol (ROBAXIN) 500 MG tablet TAKE 1 TABLET BY MOUTH EVERY 6 HOURS AS NEEDED FOR MUSCLE SPASMS Patient taking differently: Take 500 mg by mouth every 6 (six) hours as needed for muscle spasms.  11/06/19   Lovorn, Jinny Blossom, MD  mirabegron ER (MYRBETRIQ) 50 MG TB24 tablet Take 50 mg by mouth daily.    [provider]  Multiple Vitamin (MULTIVITAMIN WITH  MINERALS) TABS tablet Take 1 tablet by mouth daily. 03/13/19   Rowe Clack  N, MD  naloxone (NARCAN) nasal spray 4 mg/0.1 mL To use if pt develops unconsciousness or confusion that family thinks is related to opioids. Patient taking differently: Place into the nose See admin instructions. "To use if pt develops unconsciousness or confusion that family thinks is related to opioids" 12/30/19   Lovorn, Jinny Blossom, MD  ondansetron (ZOFRAN) 4 MG tablet Take 1-2 tablets (4-8 mg total) by mouth every 8 (eight) hours as needed for nausea, vomiting or refractory nausea / vomiting (max dose in a day is 24 mg total). 05/03/19 05/02/20  Lovorn, Jinny Blossom, MD  oxybutynin (DITROPAN) 5 MG tablet Take 5 mg by mouth See admin instructions. Take 5 mg by mouth three times a day and an additional 5 mg once daily as needed for urinary urgency 11/13/19   [provider]  oxyCODONE-acetaminophen (PERCOCET) 5-325 MG tablet Take 1 tablet by mouth every 3 (three) hours as needed for severe pain. 12/30/19   Lovorn, Jinny Blossom, MD  PARoxetine (PAXIL) 20 MG tablet Take 1 tablet (20 mg total) by mouth at bedtime. 09/25/19   Lovorn, Jinny Blossom, MD  Probiotic Product (PROBIOTIC COLON SUPPORT) CAPS Take 1 capsule by mouth daily.    [provider]  QUEtiapine (SEROQUEL) 50 MG tablet Take 50 mg by mouth at bedtime.  05/11/19   [provider]  Respiratory Therapy Supplies (SPIROMETER) KIT Use 3-4 times per hour. 11/05/19   Vivi Barrack, MD  senna (SENOKOT) 8.6 MG tablet Take 2 tablets by mouth at bedtime.     [provider]  vancomycin (VANCOCIN) 125 MG capsule Take 1 capsule (125 mg total) by mouth 4 (four) times daily. 01/01/20   Kuppelweiser, Cassie L, RPH-CPP  Vancomycin HCl 1000 MG/200ML SOLN Inject 200 mLs (1,000 mg total) into the vein every 8 (eight) hours. 01/01/20   Kuppelweiser, Cassie L, RPH-CPP  dicyclomine (BENTYL) 20 MG tablet Take 1 tablet (20 mg total) by mouth 4 (four) times daily -  before meals and at  bedtime. Patient not taking: Reported on 01/13/2020 11/01/19 01/13/20  Yaakov Guthrie, MD    Physical Exam: Vitals:   01/27/20 1701 01/27/20 1716 01/27/20 1720 01/27/20 1820  BP: (!) 163/98 (!) 142/85 (!) 156/86 (!) 151/95  Pulse: (!) 107 (!) 116 (!) 109 (!) 103  Resp: '13 11  13  ' Temp:      TempSrc:      SpO2: 94% 93% 94% 95%  Weight:      Height:        Constitutional: NAD, calm, comfortable Vitals:   01/27/20 1701 01/27/20 1716 01/27/20 1720 01/27/20 1820  BP: (!) 163/98 (!) 142/85 (!) 156/86 (!) 151/95  Pulse: (!) 107 (!) 116 (!) 109 (!) 103  Resp: '13 11  13  ' Temp:      TempSrc:      SpO2: 94% 93% 94% 95%  Weight:      Height:       Eyes: PERRL, lids and conjunctivae normal ENMT: Mucous membranes are moist. Posterior pharynx clear of any exudate or lesions.Normal dentition.  Neck: normal, supple, no masses, no thyromegaly Respiratory: clear to auscultation bilaterally, fine crackles on bilateral bases.  Increased respiratory effort. No accessory muscle use.  Tenderness on palpation left side of the chest Cardiovascular: Regular rate and rhythm, no murmurs / rubs / gallops. No extremity edema. 2+ pedal pulses. No carotid bruits.  Abdomen: no tenderness, no masses palpated. No hepatosplenomegaly. Bowel sounds positive.  Musculoskeletal: no clubbing / cyanosis. No joint deformity upper and  lower extremities. Good ROM, no contractures. Normal muscle tone.  Skin: Left hip wound looks clean Neurologic: CN 2-12 grossly intact. Sensation intact, DTR normal. Strength 5/5 in all 4.  Psychiatric: Normal judgment and insight. Alert and oriented x 3. Normal mood.      Labs on Admission: I have personally reviewed following labs and imaging studies  CBC: Recent Labs  Lab 01/27/20 1306  WBC 9.1  HGB 14.5  HCT 46.6  MCV 99.4  PLT 094   Basic Metabolic Panel: Recent Labs  Lab 01/27/20 1306  NA 135  K 4.3  CL 103  CO2 22  GLUCOSE 120*  BUN 15  CREATININE 0.62  CALCIUM  9.9   GFR: Estimated Creatinine Clearance: 116.5 mL/min (by C-G formula based on SCr of 0.62 mg/dL). Liver Function Tests: Recent Labs  Lab 01/27/20 1329  AST 39  ALT 100*  ALKPHOS 195*  BILITOT 0.9  PROT 7.8  ALBUMIN 3.6   Recent Labs  Lab 01/27/20 1329  LIPASE 22   No results for input(s): AMMONIA in the last 168 hours. Coagulation Profile: No results for input(s): INR, PROTIME in the last 168 hours. Cardiac Enzymes: No results for input(s): CKTOTAL, CKMB, CKMBINDEX, TROPONINI in the last 168 hours. BNP (last 3 results) No results for input(s): PROBNP in the last 8760 hours. HbA1C: No results for input(s): HGBA1C in the last 72 hours. CBG: No results for input(s): GLUCAP in the last 168 hours. Lipid Profile: No results for input(s): CHOL, HDL, LDLCALC, TRIG, CHOLHDL, LDLDIRECT in the last 72 hours. Thyroid Function Tests: No results for input(s): TSH, T4TOTAL, FREET4, T3FREE, THYROIDAB in the last 72 hours. Anemia Panel: No results for input(s): VITAMINB12, FOLATE, FERRITIN, TIBC, IRON, RETICCTPCT in the last 72 hours. Urine analysis:    Component Value Date/Time   COLORURINE YELLOW 01/27/2020 1415   APPEARANCEUR CLOUDY (A) 01/27/2020 1415   LABSPEC 1.003 (L) 01/27/2020 1415   PHURINE 5.0 01/27/2020 1415   GLUCOSEU NEGATIVE 01/27/2020 1415   HGBUR MODERATE (A) 01/27/2020 1415   BILIRUBINUR NEGATIVE 01/27/2020 1415   KETONESUR NEGATIVE 01/27/2020 1415   PROTEINUR NEGATIVE 01/27/2020 1415   NITRITE NEGATIVE 01/27/2020 1415   LEUKOCYTESUR LARGE (A) 01/27/2020 1415    Radiological Exams on Admission: CT Angio Chest PE W/Cm &/Or Wo Cm  Result Date: 01/27/2020 CLINICAL DATA:  Chest pain. EXAM: CT ANGIOGRAPHY CHEST WITH CONTRAST TECHNIQUE: Multidetector CT imaging of the chest was performed using the standard protocol during bolus administration of intravenous contrast. Multiplanar CT image reconstructions and MIPs were obtained to evaluate the vascular anatomy.  CONTRAST:  167m OMNIPAQUE IOHEXOL 350 MG/ML SOLN COMPARISON:  Oct 30, 2019 FINDINGS: Cardiovascular: Satisfactory opacification of the pulmonary arteries to the segmental level. No evidence of pulmonary embolism. Normal heart size. No pericardial effusion. Mediastinum/Nodes: No enlarged mediastinal, hilar, or axillary lymph nodes. Thyroid gland, trachea, and esophagus demonstrate no significant findings. Lungs/Pleura: Small subpleural cysts are seen along the right apex. Mild to moderate severity posterior bibasilar atelectasis and/or infiltrate is seen. Mild pleural thickening is seen along the anterior aspect of the mid to upper left lung. There is a small left pleural effusion. No pneumothorax is identified. Upper Abdomen: There is a small hiatal hernia. A stable 1.7 cm x 1.1 cm low-attenuation right adrenal mass is seen. Musculoskeletal: No chest wall abnormality. No acute or significant osseous findings. Review of the MIP images confirms the above findings. IMPRESSION: 1. No CT evidence of acute pulmonary embolism. 2. Mild to moderate severity posterior  bibasilar atelectasis and/or infiltrate. 3. Small left pleural effusion. 4. Stable low-attenuation right adrenal mass which may represent an adrenal adenoma. 5. Small hiatal hernia. Electronically Signed   By: Virgina Norfolk M.D.   On: 01/27/2020 16:26   CT ABDOMEN PELVIS W CONTRAST  Result Date: 01/27/2020 CLINICAL DATA:  Abdominal pain. EXAM: CT ABDOMEN AND PELVIS WITH CONTRAST TECHNIQUE: Multidetector CT imaging of the abdomen and pelvis was performed using the standard protocol following bolus administration of intravenous contrast. CONTRAST:  19m OMNIPAQUE IOHEXOL 350 MG/ML SOLN COMPARISON:  January 13, 2020 FINDINGS: Lower chest: Mild posterior bibasilar atelectasis and/or infiltrate is seen. There is a small left pleural effusion. Hepatobiliary: No focal liver abnormality is seen. No gallstones, gallbladder wall thickening, or biliary dilatation.  Pancreas: Unremarkable. No pancreatic ductal dilatation or surrounding inflammatory changes. Spleen: Normal in size without focal abnormality. Adrenals/Urinary Tract: A 1.5 cm isodense right adrenal mass is seen. The left adrenal gland is normal in appearance. Kidneys are normal in size, without renal calculi or hydronephrosis. An 8 mm cyst is seen within the posteromedial aspect of the mid right kidney. Additional 8 mm cysts are seen within the mid and lower left kidney. A suprapubic catheter is seen within the urinary bladder. Stomach/Bowel: Stomach is within normal limits. A left lower quadrant ostomy site is seen. Appendix appears normal. No evidence of bowel wall thickening, distention, or inflammatory changes. Vascular/Lymphatic: There is mild to moderate severity calcification of the abdominal aorta without evidence of aneurysmal dilatation. No enlarged abdominal or pelvic lymph nodes. Reproductive: Prostate is unremarkable. Other: No abdominal wall hernia or abnormality. No abdominopelvic ascites. Metallic density vascular coils are seen within the posterior aspect of the pelvis on the right. Musculoskeletal: Chronic posterior and lateral subcutaneous inflammatory fat stranding and edema is seen involving the left hemipelvis and left hip. Metallic density fixation screws are seen within the proximal left femur. An additional metallic density surgical screw is seen extending across the superior aspect of the left acetabulum. Chronic fracture deformities are seen involving the left iliac bone, right superior pubic ramus and bilateral inferior pubic rami. IMPRESSION: 1. Mild posterior bibasilar atelectasis and/or infiltrate. 2. Small left pleural effusion. 3. Stable 1.5 cm isodense right adrenal mass which may represent an adrenal adenoma. 4. Left lower quadrant ostomy site. 5. Chronic fracture deformities involving the left iliac bone, right superior pubic ramus and bilateral inferior pubic rami. 6. Suprapubic  catheter within the urinary bladder. 7. Aortic atherosclerosis. Aortic Atherosclerosis (ICD10-I70.0). Electronically Signed   By: TVirgina NorfolkM.D.   On: 01/27/2020 16:22   DG Chest Port 1 View  Result Date: 01/27/2020 CLINICAL DATA:  Episode of chest pain today. EXAM: PORTABLE CHEST 1 VIEW COMPARISON:  PA and lateral chest 06/24/2019 FINDINGS: There is some mild linear scarring in the left lung base. Lungs are otherwise clear. Heart size is upper normal. Aortic atherosclerosis. Left PICC is in place with its tip projecting in the lower superior vena cava. No pneumothorax or pleural effusion. No acute or focal bony abnormality. IMPRESSION: No acute disease. Aortic Atherosclerosis (ICD10-I70.0). Electronically Signed   By: TInge RiseM.D.   On: 01/27/2020 12:37    EKG: Independently reviewed.  Normal sinus rhythm, no acute ST-T changes  Assessment/Plan Active Problems:   Acute diastolic CHF (congestive heart failure) (HCC)   [place]  (please populate well all problems here in Problem List. (For example, if patient is on BP meds at home and you resume or decide  to hold them, it is a problem that needs to be her. Same for CAD, COPD, HLD and so on)  Acute hypoxic restaurant failure -Along with elevated blood pressure, suspect acute decompensation of diastolic CHF, no history of high blood pressure, but most recent ED record showed blood pressure 176/106 which quite similar to today's pattern restrictions and patient has a baseline HTN undiagnosed. -1 dose of Lasix, check echocardiogram -Start amlodipine and titrate according to response -Pneumonia unlikely given patient already on IV antibiotics and CT showed bilateral changes.  Atypical chest pain -Probably muscular given found on physical exam  Chronic left hip osteomyelitis -Continue outpatient chronic suppression antibiotic regimen IV p.o. vancomycin and Cipro  Chronic suprapubic catheter -UA  reassuring  Narcotic dependence -Resume home regimen  History of DVT -Continue Xarelto l   DVT prophylaxis: Xarelto Code Status: Full Family Communication: None at bedside Disposition Plan: Expect 1 to 2 days hospital stay for CHF work-up Consults called: None Admission status: Telemetry admission   Lequita Halt MD [facility] Pager [phone]  01/27/2020, 7:02 PM

## 2020-01-27 NOTE — ED Triage Notes (Signed)
Pt BIB [facility], called out for chest pain that became more intense chronic pain throughout body. CP resolved by arrival, no asprin or nitro given. Pt vomitting which happens with acute pain. Pt has hx of pelvic fractures and open wounds, colostomy bag and suprapubic catheter.

## 2020-01-27 NOTE — ED Notes (Signed)
Pt spo2 dropped from 93 to 83 while ambulating. Pt pulse went from 115 to 129 while ambulating. Pt complains of hip pain while ambulating.
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
## 2020-01-27 NOTE — ED Notes (Signed)
Pt refused to be placed into [facility]

## 2020-01-27 NOTE — Progress Notes (Signed)
Pharmacy Antibiotic Note  Christopher [name] is a 54 y.o. male admitted on 01/27/2020.  Pharmacy has been consulted for vancomycin dosing. Pt is on vancomycin and cipro PTA for chronic osteomyelitis. Pt is afebrile and WBC is WNL. Scr is WNL.   Plan: Continue vancomycin 1gm IV Q8H for now F/u renal fxn, C&S, clinical status and trough at SS F/u ID recs  Height: 6\' 3"  (190.5 cm) Weight: 77.1 kg (170 lb) IBW/kg (Calculated) : 84.5  Temp (24hrs), Avg:97.6 F (36.4 C), Min:97.5 F (36.4 C), Max:97.7 F (36.5 C)  Recent Labs  Lab 01/27/20 1306  WBC 9.1  CREATININE 0.62    Estimated Creatinine Clearance: 116.5 mL/min (by C-G formula based on SCr of 0.62 mg/dL).    Allergies  Allergen Reactions  . Omnicef [Cefdinir] Nausea And Vomiting    Antimicrobials this admission: Vanc PTA>> Cipro PTA>> Vanc PO PTA>>  Dose adjustments this admission: N/A  Microbiology results: Pending  Thank you for allowing pharmacy to be a part of this patient's care.  Rolfe Hartsell, 01/29/20 01/27/2020 8:40 PM

## 2020-01-28 ENCOUNTER — Inpatient Hospital Stay (HOSPITAL_COMMUNITY): Payer: No Typology Code available for payment source

## 2020-01-28 DIAGNOSIS — I5031 Acute diastolic (congestive) heart failure: Secondary | ICD-10-CM

## 2020-01-28 LAB — BASIC METABOLIC PANEL
Anion gap: 10 (ref 5–15)
BUN: 13 mg/dL (ref 6–20)
CO2: 24 mmol/L (ref 22–32)
Calcium: 9.7 mg/dL (ref 8.9–10.3)
Chloride: 103 mmol/L (ref 98–111)
Creatinine, Ser: 0.62 mg/dL (ref 0.61–1.24)
GFR calc Af Amer: >60 mL/min (ref >60)
GFR calc non Af Amer: >60 mL/min (ref >60)
Glucose, Bld: 85 mg/dL (ref 70–99)
Potassium: 3.6 mmol/L (ref 3.5–5.1)
Sodium: 137 mmol/L (ref 135–145)

## 2020-01-28 LAB — ECHOCARDIOGRAM COMPLETE
Area-P 1/2: 3.48 cm2
Height: 75 in
S' Lateral: 2.6 cm
Weight: 2720 oz

## 2020-01-28 LAB — VANCOMYCIN, TROUGH: Vancomycin Tr: 18 ug/mL (ref 15–20)

## 2020-01-28 MED ORDER — CARVEDILOL 3.125 MG PO TABS: 3.1250 mg | ORAL_TABLET | Freq: Two times a day (BID) | ORAL | 2 refills | Status: DC

## 2020-01-28 MED ORDER — AMLODIPINE BESYLATE 5 MG PO TABS: 5.0000 mg | ORAL_TABLET | Freq: Every day | ORAL | 2 refills | Status: DC

## 2020-01-28 NOTE — ED Notes (Signed)
Ordered breakfast 

## 2020-01-28 NOTE — Discharge Planning (Signed)
RNCM undated [employer] (worker's comp) on pt progression.

## 2020-01-28 NOTE — ED Notes (Signed)
[facility] contacted for transport 

## 2020-01-28 NOTE — Consult Note (Signed)
WOC Nurse Consult Note: Patient receiving care in [facility] ED 22. Reason for Consult: left hip wound Wound type: result of trauma injury many months ago Pressure Injury POA: Yes/No/NA Measurement: 1 cm x 2 cm x 3.7 cm depth Wound bed: cannot be visualized Drainage (amount, consistency, odor) tan Periwound: moist and with fungal rash Dressing procedure/placement/frequency:  Wash the left lower back wound with soap and water. Pat dry. Cut a narrow piece of Aquacel [place] [phone]) and using a cotton tipped applicator, insert it into the wound. The wound is 3 cm deep.  Leave a tail hanging out to remove the dressing. Sprinkle antifungal powder on the red skin around the wound.  This can be found in the green and white bottle in clean utility. Cover with a foam pad. Change daily.  WOC Nurse ostomy follow up Stoma type/location: LUQ colostomy Stomal assessment/size: deferred, supplies not in room, stoma covered in stool Peristomal assessment: deferred Treatment options for stomal/peristomal skin:  Output: brown stool in pouch Ostomy pouching: 2pc. , 2 and 3/4 inch pouch [place] [phone]) and skin barrier [place] #2) Order for wound care and supplies entered. Monitor the wound area(s) for worsening of condition such as: Signs/symptoms of infection,  Increase in size,  Development of or worsening of odor, Development of pain, or increased pain at the affected locations.  Notify the medical team if any of these develop.  Thank you for the consult.  Discussed plan of care with the patient.  WOC nurse will not follow at this time.  Please re-consult the WOC team if needed.  Helmut Muster, RN, MSN, CWOCN, CNS-BC, pager [phone]

## 2020-01-28 NOTE — ED Notes (Signed)
Contacted by pharmacy concerning need to draw trough vancomycin levels prior to admin, done, will verify with pharmacy before providing next dose. Echo at bedside at this time

## 2020-01-28 NOTE — Discharge Summary (Signed)
Physician Discharge Summary  Christopher [name] SNK:[id number] DOB: [birth year] DOA: 01/27/2020  PCP: Vivi Barrack, MD  Admit date: 01/27/2020 Discharge date: 01/28/2020  Admitted From: Home Disposition: Home with home health  Recommendations for Outpatient Follow-up:  1. Follow up with PCP in 1-2 weeks 2. Follow-up with infectious disease clinic as a scheduled.  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  Patient stated short of breath for about a year, occasional cough with clear sputum production.  No fever chills.  He had some chest discomfort so brought to the ER. In the emergency room, troponins normal.  EKG nonischemic.  CTA negative for PE.  CTA chest showed left-sided small pleural effusion, atelectasis.  Blood pressures 190/100 and patient not on antihypertensives at home.  Due to significant symptoms, he was admitted to the hospital and underwent investigations.  He was given one dose of Lasix in the ER and started on amlodipine and Coreg for his blood pressure and blood pressure is already improved to normal.  Underwent 2D echocardiogram today, results are pending.  Patient has to stay in the emergency room for more than 24 hours because of bed unavailability, he states he is back to his baseline.  He is on room air and able to mobilize without additional oxygen need.  Suspected pneumonia: Ruled out.  This is  likely atelectasis and patient is supposed to use incentive spirometry and deep breathing exercises at home that he will continue.  Patient is already on IV vancomycin and oral ciprofloxacin that should cover all the organism possible if he has any pulmonary infection.  He does not have any evidence of bacterial pneumonia.  He is on room air and his shortness of breath is mostly chronic related to deconditioning and atelectasis. There was suspicion of diastolic heart failure given his slightly elevated blood pressure on presentation, acute coronary syndrome ruled out with negative troponins and normal EKGs.  2D echocardiogram is done, results are pending and will call him with results.  Patient will be going home.  He will resume all his long-term pain medications, he will resume his vancomycin and ciprofloxacin as he has been doing and has established follow-up with home health infusion as well as infectious disease clinic.  Never been on blood pressure medications.  On presentation his blood pressures were more than 190.  Will prescribe antihypertensives chronic amlodipine and carvedilol.  Discharge Diagnoses:  Active Problems:   Acute diastolic CHF (congestive heart failure) (HCC)   CHF (congestive heart failure) [facility])    Discharge Instructions  Discharge Instructions    Diet - low sodium heart healthy   Complete by: As directed    Discharge instructions   Complete by: As directed    Check your blood pressures at home and keep a log book, bring it to doctors office. Resume your antibiotics  Use more frequent breathing exercises, 10 times every hour of incentive spirometry   Increase activity slowly   Complete by: As directed    No wound care   Complete by: As directed  Wash the left lower back wound with soap and water. Pat dry. Cut a narrow piece of Aquacel Kellie Simmering [phone]) and using a cotton tipped applicator, insert it into the wound. The wound is 3 cm deep.  Leave a tail hanging  out to remove the dressing. Sprinkle antifungal powder on the red skin around the wound.  This can be found in the green and white bottle in clean utility. Cover with a foam pad. Change daily     Allergies as of 01/28/2020      Reactions   Omnicef [cefdinir] Nausea And Vomiting      Medication List    STOP taking these medications   enoxaparin 40 MG/0.4ML injection Commonly known as: Lovenox     TAKE these medications   albuterol 108 (90 Base) MCG/ACT inhaler Commonly known as: VENTOLIN HFA INHALE 2 PUFFS BY MOUTH EVERY 6 HOURS AS NEEDED FOR WHEEZE OR SHORTNESS OF BREATH What changed:   how much to take  how to take this  when to take this  reasons to take this  additional instructions   amLODipine 5 MG tablet Commonly known as: NORVASC Take 1 tablet (5 mg total) by mouth daily. Start taking on: January 29, 2020   apixaban 5 MG Tabs tablet Commonly known as: Eliquis Take 1 tablet (5 mg total) by mouth 2 (two) times daily.   Blood Pressure Cuff Misc Use daily as needed to check blood pressure.   carvedilol 3.125 MG tablet Commonly known as: COREG Take 1 tablet (3.125 mg total) by mouth 2 (two) times daily with a meal.   ciprofloxacin 500 MG tablet Commonly known as: CIPRO Take 1 tablet (500 mg total) by mouth 2 (two) times daily.   clonazePAM 0.5 MG tablet Commonly known as: KLONOPIN Take 0.5 tablets (0.25 mg total) by mouth 3 (three) times daily as needed for anxiety. What changed: when to take this   dextromethorphan-guaiFENesin 30-600 MG 12hr tablet Commonly known as: MUCINEX DM Take 2 tablets by mouth 2 (two) times daily.   dronabinol 2.5 MG capsule Commonly known as: MARINOL TAKE 1 CAPSULE (2.5 MG TOTAL) BY MOUTH 2 (TWO) TIMES DAILY BEFORE A MEAL. What changed:   when to take this  reasons to take this   feeding supplement (PRO-STAT SUGAR FREE 64) Liqd Take 30 mLs by mouth 3 (three) times daily with meals. What changed: when to take this    fentaNYL 50 MCG/HR Commonly known as: Dana 1 patch onto the skin every other day. What changed: Another medication with the same name was removed. Continue taking this medication, and follow the directions you see here.   gabapentin 300 MG capsule Commonly known as: NEURONTIN TAKE 1 CAPSULE BY MOUTH 3 TIMES A DAY   melatonin 3 MG Tabs tablet Commonly known as: [employer] Melatonin Take 1 tablet (3 mg total) by mouth at bedtime.   methocarbamol 500 MG tablet Commonly known as: ROBAXIN TAKE 1 TABLET BY MOUTH EVERY 6 HOURS AS NEEDED FOR MUSCLE SPASMS What changed: See the new instructions.   multivitamin with minerals Tabs tablet Take 1 tablet by mouth daily.   Myrbetriq 50 MG Tb24 tablet Generic drug: mirabegron ER Take 50 mg by mouth daily.   naloxone 4 MG/0.1ML Liqd nasal spray kit Commonly known as: NARCAN To use if pt develops unconsciousness or confusion that family thinks is related to opioids. What changed:   how to take this  when to take this  additional instructions   ondansetron 4  MG tablet Commonly known as: Zofran Take 1-2 tablets (4-8 mg total) by mouth every 8 (eight) hours as needed for nausea, vomiting or refractory nausea / vomiting (max dose in a day is 24 mg total).   oxybutynin 5 MG tablet Commonly known as: DITROPAN Take 5 mg by mouth See admin instructions. Take 5 mg by mouth three times a day and an additional 5 mg once daily as needed for urinary urgency   oxyCODONE-acetaminophen 5-325 MG tablet Commonly known as: Percocet Take 1 tablet by mouth every 3 (three) hours as needed for severe pain.   PARoxetine 20 MG tablet Commonly known as: Paxil Take 1 tablet (20 mg total) by mouth at bedtime.   Probiotic Colon Support Caps Take 1 capsule by mouth daily.   QUEtiapine 50 MG tablet Commonly known as: SEROQUEL Take 50 mg by mouth at bedtime.   senna 8.6 MG tablet Commonly known as: SENOKOT Take 2 tablets by mouth at bedtime.    Spirometer Kit Use 3-4 times per hour.   vancomycin 125 MG capsule Commonly known as: VANCOCIN Take 1 capsule (125 mg total) by mouth 4 (four) times daily.   Vancomycin HCl 1000 MG/200ML Soln Inject 200 mLs (1,000 mg total) into the vein every 8 (eight) hours.       Follow-up Information    Vivi Barrack, MD Follow up in 2 week(s).   Specialty: Family Medicine Contact information: [place] [address] [phone]              Allergies  Allergen Reactions  . Omnicef [Cefdinir] Nausea And Vomiting    Consultations:  Wound care   Procedures/Studies: CT Angio Chest PE W/Cm &/Or Wo Cm  Result Date: 01/27/2020 CLINICAL DATA:  Chest pain. EXAM: CT ANGIOGRAPHY CHEST WITH CONTRAST TECHNIQUE: Multidetector CT imaging of the chest was performed using the standard protocol during bolus administration of intravenous contrast. Multiplanar CT image reconstructions and MIPs were obtained to evaluate the vascular anatomy. CONTRAST:  134m OMNIPAQUE IOHEXOL 350 MG/ML SOLN COMPARISON:  Oct 30, 2019 FINDINGS: Cardiovascular: Satisfactory opacification of the pulmonary arteries to the segmental level. No evidence of pulmonary embolism. Normal heart size. No pericardial effusion. Mediastinum/Nodes: No enlarged mediastinal, hilar, or axillary lymph nodes. Thyroid gland, trachea, and esophagus demonstrate no significant findings. Lungs/Pleura: Small subpleural cysts are seen along the right apex. Mild to moderate severity posterior bibasilar atelectasis and/or infiltrate is seen. Mild pleural thickening is seen along the anterior aspect of the mid to upper left lung. There is a small left pleural effusion. No pneumothorax is identified. Upper Abdomen: There is a small hiatal hernia. A stable 1.7 cm x 1.1 cm low-attenuation right adrenal mass is seen. Musculoskeletal: No chest wall abnormality. No acute or significant osseous findings. Review of the MIP images confirms the above  findings. IMPRESSION: 1. No CT evidence of acute pulmonary embolism. 2. Mild to moderate severity posterior bibasilar atelectasis and/or infiltrate. 3. Small left pleural effusion. 4. Stable low-attenuation right adrenal mass which may represent an adrenal adenoma. 5. Small hiatal hernia. Electronically Signed   By: TVirgina NorfolkM.D.   On: 01/27/2020 16:26   CT ABDOMEN PELVIS W CONTRAST  Result Date: 01/27/2020 CLINICAL DATA:  Abdominal pain. EXAM: CT ABDOMEN AND PELVIS WITH CONTRAST TECHNIQUE: Multidetector CT imaging of the abdomen and pelvis was performed using the standard protocol following bolus administration of intravenous contrast. CONTRAST:  1074mOMNIPAQUE IOHEXOL 350 MG/ML SOLN COMPARISON:  January 13, 2020 FINDINGS: Lower chest: Mild  posterior bibasilar atelectasis and/or infiltrate is seen. There is a small left pleural effusion. Hepatobiliary: No focal liver abnormality is seen. No gallstones, gallbladder wall thickening, or biliary dilatation. Pancreas: Unremarkable. No pancreatic ductal dilatation or surrounding inflammatory changes. Spleen: Normal in size without focal abnormality. Adrenals/Urinary Tract: A 1.5 cm isodense right adrenal mass is seen. The left adrenal gland is normal in appearance. Kidneys are normal in size, without renal calculi or hydronephrosis. An 8 mm cyst is seen within the posteromedial aspect of the mid right kidney. Additional 8 mm cysts are seen within the mid and lower left kidney. A suprapubic catheter is seen within the urinary bladder. Stomach/Bowel: Stomach is within normal limits. A left lower quadrant ostomy site is seen. Appendix appears normal. No evidence of bowel wall thickening, distention, or inflammatory changes. Vascular/Lymphatic: There is mild to moderate severity calcification of the abdominal aorta without evidence of aneurysmal dilatation. No enlarged abdominal or pelvic lymph nodes. Reproductive: Prostate is unremarkable. Other: No abdominal  wall hernia or abnormality. No abdominopelvic ascites. Metallic density vascular coils are seen within the posterior aspect of the pelvis on the right. Musculoskeletal: Chronic posterior and lateral subcutaneous inflammatory fat stranding and edema is seen involving the left hemipelvis and left hip. Metallic density fixation screws are seen within the proximal left femur. An additional metallic density surgical screw is seen extending across the superior aspect of the left acetabulum. Chronic fracture deformities are seen involving the left iliac bone, right superior pubic ramus and bilateral inferior pubic rami. IMPRESSION: 1. Mild posterior bibasilar atelectasis and/or infiltrate. 2. Small left pleural effusion. 3. Stable 1.5 cm isodense right adrenal mass which may represent an adrenal adenoma. 4. Left lower quadrant ostomy site. 5. Chronic fracture deformities involving the left iliac bone, right superior pubic ramus and bilateral inferior pubic rami. 6. Suprapubic catheter within the urinary bladder. 7. Aortic atherosclerosis. Aortic Atherosclerosis (ICD10-I70.0). Electronically Signed   By: Virgina Norfolk M.D.   On: 01/27/2020 16:22   CT ABDOMEN PELVIS W CONTRAST  Result Date: 01/13/2020 CLINICAL DATA:  Pain EXAM: CT ABDOMEN AND PELVIS WITH CONTRAST TECHNIQUE: Multidetector CT imaging of the abdomen and pelvis was performed using the standard protocol following bolus administration of intravenous contrast. CONTRAST:  171m OMNIPAQUE IOHEXOL 300 MG/ML  SOLN COMPARISON:  CT dated June 18, 2019 FINDINGS: Lower chest: There is a small chronic left-sided pleural effusion.There is chronic atelectasis at the left lung base. The heart size is normal. Hepatobiliary: The liver is normal. There are questionable mild inflammatory changes about the gallbladder. The proximal common bile duct appears to be dilated measuring up to approximately 1 cm (coronal series 6, image 47).There may be some mild intrahepatic  biliary ductal dilatation. Pancreas: Normal contours without ductal dilatation. No peripancreatic fluid collection. Spleen: Unremarkable. Adrenals/Urinary Tract: --Adrenal glands: There is a right adrenal nodule measuring approximately 1.3 cm. This is stable across multiple prior studies. --Right kidney/ureter: No hydronephrosis or radiopaque kidney stones. --Left kidney/ureter: No hydronephrosis or radiopaque kidney stones. --Urinary bladder: The urinary bladder is decompressed with a suprapubic catheter. Stomach/Bowel: --Stomach/Duodenum: No hiatal hernia or other gastric abnormality. Normal duodenal course and caliber. --Small bowel: Unremarkable. --Colon: There is a large amount of stool throughout the visualized portions of the colon. There is an end colostomy in the left lower quadrant without evidence for an obstruction. --Appendix: Normal. Vascular/Lymphatic: Atherosclerotic calcification is present within the non-aneurysmal abdominal aorta, without hemodynamically significant stenosis. --No retroperitoneal lymphadenopathy. --No mesenteric lymphadenopathy. --No pelvic or inguinal lymphadenopathy. Reproductive:  Unremarkable Other: No ascites or free air. The abdominal wall is normal. Musculoskeletal. There are extensive posttraumatic changes of the patient's pelvis. These changes are essentially similar to the patient's prior CT. 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 Again noted is an old decubitus ulcer on the patient's left. IMPRESSION: 1. There are questionable mild inflammatory changes about the gallbladder. The proximal common bile duct appears to be dilated measuring up to approximately 1 cm. There may be some mild intrahepatic biliary ductal dilatation. Correlation with laboratory studies is recommended. If there is clinical concern for acute cholecystitis, follow-up with ultrasound is recommended. 2. Large stool burden. 3. Small chronic left-sided  pleural effusion with chronic atelectasis at the left lung base. 4. Chronic posttraumatic changes as previously described. Aortic Atherosclerosis (ICD10-I70.0). Electronically Signed   By: Constance Holster M.D.   On: 01/13/2020 17:35   DG Chest Port 1 View  Result Date: 01/27/2020 CLINICAL DATA:  Episode of chest pain today. EXAM: PORTABLE CHEST 1 VIEW COMPARISON:  PA and lateral chest 06/24/2019 FINDINGS: There is some mild linear scarring in the left lung base. Lungs are otherwise clear. Heart size is upper normal. Aortic atherosclerosis. Left PICC is in place with its tip projecting in the lower superior vena cava. No pneumothorax or pleural effusion. No acute or focal bony abnormality. IMPRESSION: No acute disease. Aortic Atherosclerosis (ICD10-I70.0). Electronically Signed   By: Inge Rise M.D.   On: 01/27/2020 12:37   US Abdomen Limited RUQ  Result Date: 01/13/2020 CLINICAL DATA:  Epigastric pain EXAM: ULTRASOUND ABDOMEN LIMITED RIGHT UPPER QUADRANT COMPARISON:  CT from same day FINDINGS: Gallbladder: No gallstones or wall thickening visualized. No sonographic Murphy sign noted by sonographer. Common bile duct: Diameter: 11 mm. There is no definite intrahepatic biliary ductal dilatation, however evaluation of the right hepatic lobe was limited. Liver: No focal lesion identified. Within normal limits in parenchymal echogenicity. Portal vein is patent on color Doppler imaging with normal direction of blood flow towards the liver. Other: None. IMPRESSION: 1. No evidence for cholelithiasis or acute cholecystitis. 2. Dilated common bile duct without definite intrahepatic biliary ductal dilatation. Correlation with laboratory studies is recommended. Of note, this common bile duct dilatation is relatively stable since 2020. Electronically Signed   By: Constance Holster M.D.   On: 01/13/2020 18:41    (Echo, Carotid, EGD, Colonoscopy, ERCP)    Subjective: Patient was seen and examined.  His wife  was on the phone.  We discussed about different possibilities.  Patient was wanting to go home and follow-up.  Remains afebrile. He is mostly wheelchair-bound, he cannot bear weight on his legs.  He states his shortness of breath is ongoing for about a year now.   Discharge Exam: Vitals:   01/28/20 1515 01/28/20 1530  BP: 113/75 112/71  Pulse: 78 78  Resp: 13 13  Temp:    SpO2: 96% 96%   Vitals:   01/28/20 1446 01/28/20 1500 01/28/20 1515 01/28/20 1530  BP: 119/70 109/74 113/75 112/71  Pulse: 80 78 78 78  Resp: _0 Temp:      TempSrc:      SpO2: 95% 96% 96% 96%  Weight:      Height:        General: Pt is alert, awake, not in acute distress Anxious and chronically sick looking gentleman, frail. Cardiovascular: RRR, S1/S2 +, no rubs, no gallops Respiratory: CTA bilaterally, no wheezing, no rhonchi,  no added sounds. Abdominal: Soft, NT, ND, bowel sounds + Patient has a colostomy bag with loose stool, has a suprapubic catheter that is draining clear urine. Extremities:  Patient has a stage IV sacral decubitus ulcer with dressing intact. Wound care plan attached.    The results of significant diagnostics from this hospitalization (including imaging, microbiology, ancillary and laboratory) are listed below for reference.     Microbiology: Recent Results (from the past 240 hour(s))  SARS Coronavirus 2 by RT PCR (hospital order, performed in [facility] hospital lab) Nasopharyngeal Nasopharyngeal Swab     Status: None   Collection Time: 01/27/20  5:46 PM   Specimen: Nasopharyngeal Swab  Result Value Ref Range Status   SARS Coronavirus 2 NEGATIVE NEGATIVE Final    Comment: (NOTE) SARS-CoV-2 target nucleic acids are NOT DETECTED.  The SARS-CoV-2 RNA is generally detectable in upper and lower respiratory specimens during the acute phase of infection. The lowest concentration of SARS-CoV-2 viral copies this assay can detect is 250 copies / mL. A negative result does  not preclude SARS-CoV-2 infection and should not be used as the sole basis for treatment or other patient management decisions.  A negative result may occur with improper specimen collection / handling, submission of specimen other than nasopharyngeal swab, presence of viral mutation(s) within the areas targeted by this assay, and inadequate number of viral copies (<250 copies / mL). A negative result must be combined with clinical observations, patient history, and epidemiological information.  Fact Sheet for Patients:   StrictlyIdeas.no  Fact Sheet for Healthcare Providers: BankingDealers.co.za  This test is not yet approved or  cleared by the [place] FDA and has been authorized for detection and/or diagnosis of SARS-CoV-2 by FDA under an Emergency Use Authorization (EUA).  This EUA will remain in effect (meaning this test can be used) for the duration of the COVID-19 declaration under Section 564(b)(1) of the Act, 21 U.S.C. section 360bbb-3(b)(1), unless the authorization is terminated or revoked sooner.  Performed at [facility], [place] [address], [place], [place] [address]      Labs: BNP (last 3 results) No results for input(s): BNP in the last 8760 hours. Basic Metabolic Panel: Recent Labs  Lab 01/27/20 1306 01/28/20 0402  NA 135 137  K 4.3 3.6  CL 103 103  CO2 22 24  GLUCOSE 120* 85  BUN 15 13  CREATININE 0.62 0.62  CALCIUM 9.9 9.7   Liver Function Tests: Recent Labs  Lab 01/27/20 1329  AST 39  ALT 100*  ALKPHOS 195*  BILITOT 0.9  PROT 7.8  ALBUMIN 3.6   Recent Labs  Lab 01/27/20 1329  LIPASE 22   No results for input(s): AMMONIA in the last 168 hours. CBC: Recent Labs  Lab 01/27/20 1306  WBC 9.1  HGB 14.5  HCT 46.6  MCV 99.4  PLT 224   Cardiac Enzymes: No results for input(s): CKTOTAL, CKMB, CKMBINDEX, TROPONINI in the last 168 hours. BNP: Invalid input(s): POCBNP CBG: No  results for input(s): GLUCAP in the last 168 hours. D-Dimer No results for input(s): DDIMER in the last 72 hours. Hgb A1c No results for input(s): HGBA1C in the last 72 hours. Lipid Profile No results for input(s): CHOL, HDL, LDLCALC, TRIG, CHOLHDL, LDLDIRECT in the last 72 hours. Thyroid function studies No results for input(s): TSH, T4TOTAL, T3FREE, THYROIDAB in the last 72 hours.  Invalid input(s): FREET3 Anemia work up No results for input(s): VITAMINB12, FOLATE, FERRITIN, TIBC, IRON, RETICCTPCT in the  last 72 hours. Urinalysis    Component Value Date/Time   COLORURINE YELLOW 01/27/2020 1415   APPEARANCEUR CLOUDY (A) 01/27/2020 1415   LABSPEC 1.003 (L) 01/27/2020 1415   PHURINE 5.0 01/27/2020 1415   GLUCOSEU NEGATIVE 01/27/2020 1415   HGBUR MODERATE (A) 01/27/2020 1415   BILIRUBINUR NEGATIVE 01/27/2020 1415   KETONESUR NEGATIVE 01/27/2020 1415   PROTEINUR NEGATIVE 01/27/2020 1415   NITRITE NEGATIVE 01/27/2020 1415   LEUKOCYTESUR LARGE (A) 01/27/2020 1415   Sepsis Labs Invalid input(s): PROCALCITONIN,  WBC,  LACTICIDVEN Microbiology Recent Results (from the past 240 hour(s))  SARS Coronavirus 2 by RT PCR (hospital order, performed in [place] hospital lab) Nasopharyngeal Nasopharyngeal Swab     Status: None   Collection Time: 01/27/20  5:46 PM   Specimen: Nasopharyngeal Swab  Result Value Ref Range Status   SARS Coronavirus 2 NEGATIVE NEGATIVE Final    Comment: (NOTE) SARS-CoV-2 target nucleic acids are NOT DETECTED.  The SARS-CoV-2 RNA is generally detectable in upper and lower respiratory specimens during the acute phase of infection. The lowest concentration of SARS-CoV-2 viral copies this assay can detect is 250 copies / mL. A negative result does not preclude SARS-CoV-2 infection and should not be used as the sole basis for treatment or other patient management decisions.  A negative result may occur with improper specimen collection / handling, submission of  specimen other than nasopharyngeal swab, presence of viral mutation(s) within the areas targeted by this assay, and inadequate number of viral copies (<250 copies / mL). A negative result must be combined with clinical observations, patient history, and epidemiological information.  Fact Sheet for Patients:   StrictlyIdeas.no  Fact Sheet for Healthcare Providers: BankingDealers.co.za  This test is not yet approved or  cleared by the [place] FDA and has been authorized for detection and/or diagnosis of SARS-CoV-2 by FDA under an Emergency Use Authorization (EUA).  This EUA will remain in effect (meaning this test can be used) for the duration of the COVID-19 declaration under Section 564(b)(1) of the Act, 21 U.S.C. section 360bbb-3(b)(1), unless the authorization is terminated or revoked sooner.  Performed at [facility], [place] [address] [place], [place] [address]      Time coordinating discharge: 35 minutes  SIGNED:   Barb Merino, MD  [facility] 01/28/2020, 4:26 PM

## 2020-01-28 NOTE — Progress Notes (Signed)
  Echocardiogram 2D Echocardiogram has been performed.  Leta Jungling M 01/28/2020, 2:49 PM

## 2020-01-29 ENCOUNTER — Ambulatory Visit: Payer: Self-pay | Admitting: Infectious Disease

## 2020-01-30 ENCOUNTER — Telehealth: Payer: Self-pay

## 2020-01-30 NOTE — Telephone Encounter (Signed)
Yes, please continue with ciprofloxacin, oral vancomycin and vancomyin.

## 2020-01-30 NOTE — Telephone Encounter (Signed)
Patient recently admitted and discharged from hospital. Per [facility], new IV antibiotic orders are needed with discharge. Forwarding to provider to confirm previous regimen.   Melani Brisbane Loyola Mast, RN

## 2020-01-30 NOTE — Telephone Encounter (Signed)
Verbal orders given to Melissa at [facility] to continue abx regimen with same end date. Will follow up with any additional concerns.  Xane Amsden Loyola Mast, RN

## 2020-01-30 NOTE — Telephone Encounter (Cosign Needed)
Transition Care Management Follow-up Telephone Call  Date of discharge and from where: 01/30/20 from [facility]  How have you been since you were released from the hospital? ok  Any questions or concerns? Yes Note left for PCP  Items Reviewed:  Did the pt receive and understand the discharge instructions provided? Yes   Medications obtained and verified? Yes   Any new allergies since your discharge? No   Dietary orders reviewed? Yes  Do you have support at home? Yes   Functional Questionnaire: (I = Independent and D = Dependent) ADLs: I  Bathing/Dressing- D  Meal Prep- D  Eating- I  Maintaining continence- D  Transferring/Ambulation- D  Managing Meds- D  Follow up appointments reviewed:   PCP Hospital f/u appt confirmed? Yes  Scheduled to see Dr [name] on August 24th @ 3:00pm.  Specialist [facility] f/u appt confirmed? Infectious disease clinic f/u 02/2020  Are transportation arrangements needed? No   If their condition worsens, is the pt aware to call PCP or go to the Emergency Dept.? Yes  Was the patient provided with contact information for the PCP's office or ED? Yes  Was to pt encouraged to call back with questions or concerns? Yes

## 2020-01-31 ENCOUNTER — Ambulatory Visit (HOSPITAL_COMMUNITY)
Admission: RE | Admit: 2020-01-31 | Discharge: 2020-01-31 | Disposition: A | Discharge status: Home / Self Care | Payer: Self-pay | Source: Ambulatory Visit | Attending: Cardiovascular Disease | Admitting: Cardiovascular Disease

## 2020-01-31 ENCOUNTER — Other Ambulatory Visit: Payer: Self-pay

## 2020-01-31 DIAGNOSIS — I82622 Acute embolism and thrombosis of deep veins of left upper extremity: Secondary | ICD-10-CM | POA: Insufficient documentation

## 2020-02-03 ENCOUNTER — Other Ambulatory Visit: Payer: Self-pay

## 2020-02-03 ENCOUNTER — Encounter: Payer: Self-pay | Admitting: Physical Medicine and Rehabilitation

## 2020-02-03 ENCOUNTER — Encounter
Payer: No Typology Code available for payment source | Attending: Physical Medicine and Rehabilitation | Admitting: Physical Medicine and Rehabilitation

## 2020-02-03 VITALS — BP 130/81 | HR 94 | Temp 98.5°F

## 2020-02-03 DIAGNOSIS — T148XXA Other injury of unspecified body region, initial encounter: Secondary | ICD-10-CM | POA: Diagnosis present

## 2020-02-03 DIAGNOSIS — M86152 Other acute osteomyelitis, left femur: Secondary | ICD-10-CM | POA: Diagnosis not present

## 2020-02-03 DIAGNOSIS — G8921 Chronic pain due to trauma: Secondary | ICD-10-CM | POA: Diagnosis present

## 2020-02-03 DIAGNOSIS — M7918 Myalgia, other site: Secondary | ICD-10-CM | POA: Insufficient documentation

## 2020-02-03 DIAGNOSIS — L089 Local infection of the skin and subcutaneous tissue, unspecified: Secondary | ICD-10-CM | POA: Insufficient documentation

## 2020-02-03 MED ORDER — PAROXETINE HCL 40 MG PO TABS
40.0000 mg | ORAL_TABLET | Freq: Every day | ORAL | 5 refills | Status: DC
Start: 2020-02-03 — End: 2020-03-30

## 2020-02-03 MED ORDER — FENTANYL 75 MCG/HR TD PT72: 1.0000 | MEDICATED_PATCH | TRANSDERMAL | 0 refills | Status: DC

## 2020-02-03 NOTE — Progress Notes (Signed)
Please inform patient of the following:  Good news! Clot has cleared. Ok for him to stop blood thinner.  Katina Degree. [name], MD 02/03/2020 10:11 AM

## 2020-02-03 NOTE — Progress Notes (Signed)
Subjective:    Patient ID: Christopher [name], male    DOB: 01/22/1966, 54 y.o.   MRN: [id number]  HPI   Pt is a 54 yr old male with hx of Multitrauma- causing L femoral neck fx, degloving of L hip to going/scrotum, bladder neck trauma- got SPC, developed compartment syndrome -s/p surgery for that; also diverting colostomy, skin grafts, and s/p trachand PEG- PEG is out. 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 B/L tennis elbow new-and B/L pending/forming ulnar neuropathy Osteomyelitis of L hip found again- on Fetroja IV ABX 3x/day for 3 hours increments. With new Abd pain.epigastric pain.    Didn't have herpes zoster, like thought by Plastic Surgery.  Saw ID NP on 8/12- "doing all they can do".   Pseudomonas- one of the most ABX resistant bacteria he's seen in years in [place].   On [place].  Wasn't touching it- now back on IV and PO Vanc.   Went to [facility]- Dr Domingo Cocking- Dr Jena Gauss referred them.  Ortho Oncology- he's the surgical type.  Dr Darral Dash wasn't hopeful?  Wants to speak to Plastic Surgery before goes any further- in a holding pattern.   All pain is aching, stabbing, throbbing,  No on fire, no burning.   Stopped taking Marinol- was making self sick- and "fat"- pain about the same when stopped it.   Between 6-7/10- is "tolerable".  35% of time is at 6/10-    Usually uses Miralax daily (when not on IV ABX) and 2 senokot tabs nightly- and works well usually- with IV ABX, is using Miralax less.   Some days- when sits in w/c more, feels less pain.  So likes the w/c.    Mood is worse-   Mobility concerns and upset about doctor's not sure what else can be done.   Should be able to get Zenaida Niece sometimes soon- settled on this.  Loves car  shows! Not able to participate in life, since cannot get out of house.    Was in hospital 2 days last week- sitting straight up in bed- didn't feel right- couldn't breathe, L arm hurting, chest hurt, and vomiting -went to hospital-   Pain Inventory Average Pain 8 Pain Right Now 8 My pain is sharp, burning, dull, stabbing, tingling and aching  In the last 24 hours, has pain interfered with the following? General activity 7 Relation with others 7 Enjoyment of life 9 What TIME of day is your pain at its worst? morning , daytime, evening and night Sleep (in general) Fair  Pain is worse with: walking, bending, sitting, inactivity, standing and some activites Pain improves with: rest and medication Relief from Meds: 6  Family History  Problem Relation Age of Onset  . Breast cancer Mother        with mets to the bones   Social History   Socioeconomic History  . Marital status: Married    Spouse name: Not on file  . Number of children: Not on file  . Years of education: Not on file  . Highest education level: Not on file  Occupational History  . Not on file  Tobacco Use  . Smoking status: Former Smoker    Packs/day: 1.00    Years: 20.00    Pack years:  20.00    Types: Cigarettes    Quit date: 07/10/2018    Years since quitting: 1.5  . Smokeless tobacco: Never Used  Vaping Use  . Vaping Use: Never used  Substance and Sexual Activity  . Alcohol use: Never  . Drug use: Yes    Types: Oxycodone, Fentanyl    Comment: Fentanyl patch/oxycodone since 06/2018  . Sexual activity: Yes  Other Topics Concern  . Not on file  Social History Narrative   ** Merged History Encounter **       Social Determinants of Health   Financial Resource Strain: Low Risk   . Difficulty of Paying Living Expenses: Not very hard  Food Insecurity: Food Insecurity Present  . Worried About Programme researcher, broadcasting/film/video in the Last Year: Sometimes true  . Ran Out of Food in the Last Year: Sometimes true   Transportation Needs: No Transportation Needs  . Lack of Transportation (Medical): No  . Lack of Transportation (Non-Medical): No  Physical Activity: Inactive  . Days of Exercise per Week: 0 days  . Minutes of Exercise per Session: 0 min  Stress: Stress Concern Present  . Feeling of Stress : Rather much  Social Connections:   . Frequency of Communication with Friends and Family: Not on file  . Frequency of Social Gatherings with Friends and Family: Not on file  . Attends Religious Services: Not on file  . Active Member of Clubs or Organizations: Not on file  . Attends Banker Meetings: Not on file  . Marital Status: Not on file   Past Surgical History:  Procedure Laterality Date  . APPLICATION OF A-CELL OF BACK N/A 08/06/2018   Procedure: Application Of A-Cell Of Back;  Surgeon: Peggye Form, DO;  [facility];  Service: Plastics;  Laterality: N/A;  . APPLICATION OF A-CELL OF EXTREMITY Left 08/06/2018   Procedure: Application Of A-Cell Of Extremity;  Surgeon: Peggye Form, DO;  [facility];  Service: Plastics;  Laterality: Left;  . APPLICATION OF A-CELL OF EXTREMITY Left 09/18/2019   Procedure: APPLICATION OF A-CELL OF EXTREMITY;  Surgeon: Peggye Form, DO;  [facility];  Service: Plastics;  Laterality: Left;  . APPLICATION OF WOUND VAC  07/12/2018   Procedure: Application Of Wound Vac to the Left Thigh and Scrotum.;  Surgeon: Roby Lofts, MD;  [facility];  Service: Orthopedics;;  . APPLICATION OF WOUND VAC  07/10/2018   Procedure: Application Of Wound Vac;  Surgeon: Berna Bue, MD;  [facility];  Service: General;;  . COLON SURGERY  2020   colostomy  . COLOSTOMY N/A 07/23/2018   Procedure: COLOSTOMY;  Surgeon: Violeta Gelinas, MD;  Location: [facility];  Service: General;  Laterality: N/A;  . CYSTOSCOPY W/ URETERAL STENT PLACEMENT N/A 07/15/2018   Procedure: RETROGRADE URETHROGRAM;  Surgeon: Marcine Matar, MD;  Location:  [facility] OR;  Service: Urology;  Laterality: N/A;  . CYSTOSCOPY WITH LITHOLAPAXY N/A 05/06/2019   Procedure: CYSTOSCOPY BASKET BLADDER STONE EXTRACTION;  Surgeon: Malen Gauze, MD;  Location: [facility];  Service: Urology;  Laterality: N/A;  30 MINS  . CYSTOSTOMY N/A 05/06/2019   Procedure: REPLACEMENT OF SUPRAPUBIC CATHETER;  Surgeon: Malen Gauze, MD;  Location: [facility];  Service: Urology;  Laterality: N/A;  . DEBRIDEMENT AND CLOSURE WOUND Left 03/04/2019   Procedure: Excision of hip wound with placement of Acell;  Surgeon: Peggye Form, DO;  [facility];  Service: Plastics;  Laterality: Left;  . ESOPHAGOGASTRODUODENOSCOPY N/A 08/14/2018   Procedure: ESOPHAGOGASTRODUODENOSCOPY (EGD);  Surgeon: Violeta Gelinashompson, Burke, MD;  [facility];  Service: General;  Laterality: N/A;  bedside  . FACIAL RECONSTRUCTION SURGERY     X 2--once as a teenager and second time in his 7030's  . HARDWARE REMOVAL Left 03/04/2019   Procedure: Left Hip Hardware Removal;  Surgeon: Roby LoftsHaddix, Kevin P, MD;  [facility];  Service: Orthopedics;  Laterality: Left;  . HIP PINNING,CANNULATED Left 07/12/2018   Procedure: CANNULATED HIP PINNING;  Surgeon: Roby LoftsHaddix, Kevin P, MD;  [facility];  Service: Orthopedics;  Laterality: Left;  . HIP SURGERY    . HOLMIUM LASER APPLICATION Right 07/15/2019   Procedure: HOLMIUM LASER APPLICATION;  Surgeon: Malen GauzeMcKenzie, Patrick L, MD;  [facility];  Service: Urology;  Laterality: Right;  . I & D EXTREMITY Left 07/25/2018   Procedure: Debridement of buttock, scrotum and left leg, placement of acell and vac;  Surgeon: Peggye Formillingham, Claire S, DO;  [facility];  Service: Plastics;  Laterality: Left;  . I & D EXTREMITY N/A 08/06/2018   Procedure: Debridement of buttock, scrotum and left leg;  Surgeon: Peggye Formillingham, Claire S, DO;  [facility];  Service: Plastics;  Laterality: N/A;  . I & D EXTREMITY N/A 08/13/2018    Procedure: Debridement of buttock, scrotum and left leg, placement of acell and vac;  Surgeon: Peggye Formillingham, Claire S, DO;  [facility];  Service: Plastics;  Laterality: N/A;  90 min, please  . INCISION AND DRAINAGE HIP Left 09/18/2019   Procedure: IRRIGATION AND DEBRIDEMENT HIP/ PELVIS WITH WOUND VAC PLACEMENT;  Surgeon: Roby LoftsHaddix, Kevin P, MD;  [facility];  Service: Orthopedics;  Laterality: Left;  . INCISION AND DRAINAGE OF WOUND N/A 07/18/2018   Procedure: Debridement of left leg, buttocks and scrotal wound with placement of acell and Flexiseal;  Surgeon: Peggye Formillingham, Claire S, DO;  [facility];  Service: Plastics;  Laterality: N/A;  . INCISION AND DRAINAGE OF WOUND Left 08/29/2018   Procedure: Debridement of buttock, scrotum and left leg, placement of acell and vac;  Surgeon: Peggye Formillingham, Claire S, DO;  [facility];  Service: Plastics;  Laterality: Left;  75 min, please  . INCISION AND DRAINAGE OF WOUND Bilateral 10/23/2018   Procedure: DEBRIDEMENT OF BUTTOCK,SCROTUM, AND LEG WOUNDS WITH PLACEMENT OF ACELL- BILATERAL 90 MIN;  Surgeon: Peggye Formillingham, Claire S, DO;  [facility];  Service: Plastics;  Laterality: Bilateral;  . IR ANGIOGRAM PELVIS SELECTIVE OR SUPRASELECTIVE  07/10/2018  . IR ANGIOGRAM PELVIS SELECTIVE OR SUPRASELECTIVE  07/10/2018  . IR ANGIOGRAM SELECTIVE EACH ADDITIONAL VESSEL  07/10/2018  . IR EMBO ART  VEN HEMORR LYMPH EXTRAV  INC GUIDE ROADMAPPING  07/10/2018  . IR NEPHROSTOMY PLACEMENT LEFT  04/05/2019  . IR NEPHROSTOMY PLACEMENT RIGHT  05/31/2019  . IR US GUIDE BX ASP/DRAIN  07/10/2018  . IR US GUIDE VASC ACCESS RIGHT  07/10/2018  . IR VENO/EXT/UNI LEFT  07/10/2018  . IRRIGATION AND DEBRIDEMENT OF WOUND WITH SPLIT THICKNESS SKIN GRAFT Left 09/19/2018   Procedure: Debridement of gluteal wound with placement of acell to left leg.;  Surgeon: Peggye Formillingham, Claire S, DO;  [facility];  Service: Plastics;  Laterality: Left;  2.5 hours, please  . LAPAROTOMY N/A 07/12/2018    Procedure: EXPLORATORY LAPAROTOMY;  Surgeon: Violeta Gelinashompson, Burke, MD;  [facility];  Service: General;  Laterality: N/A;  . LAPAROTOMY N/A 07/15/2018   Procedure: WOUND EXPLORATION; CLOSURE OF ABDOMEN;  Surgeon:  Violeta Gelinas, MD;  Location: [facility] OR;  Service: General;  Laterality: N/A;  . LAPAROTOMY  07/10/2018   Procedure: Exploratory Laparotomy;  Surgeon: Berna Bue, MD;  Location: [facility] OR;  Service: General;;  . MASS EXCISION Left 09/18/2019   Procedure: EXCISION UPPER LEFT INNER THIGH WOUND;  Surgeon: Peggye Form, DO;  [facility];  Service: Plastics;  Laterality: Left;  . NEPHROLITHOTOMY Right 07/15/2019   Procedure: NEPHROLITHOTOMY PERCUTANEOUS;  Surgeon: Malen Gauze, MD;  Location: [facility];  Service: Urology;  Laterality: Right;  90 MINS  . PEG PLACEMENT N/A 08/14/2018   Procedure: PERCUTANEOUS ENDOSCOPIC GASTROSTOMY (PEG) PLACEMENT;  Surgeon: Violeta Gelinas, MD;  Location: [facility];  Service: General;  Laterality: N/A;  . PERCUTANEOUS TRACHEOSTOMY N/A 08/02/2018   Procedure: PERCUTANEOUS TRACHEOSTOMY;  Surgeon: Violeta Gelinas, MD;  Location: [facility] OR;  Service: General;  Laterality: N/A;  . RADIOLOGY WITH ANESTHESIA N/A 07/10/2018   Procedure: IR WITH ANESTHESIA;  Surgeon: Simonne Come, MD;  Location: [facility] OR;  Service: Radiology;  Laterality: N/A;  . RADIOLOGY WITH ANESTHESIA Right 07/10/2018   Procedure: Ir With Anesthesia;  Surgeon: Simonne Come, MD;  Location: [facility] OR;  Service: Radiology;  Laterality: Right;  . SCROTAL EXPLORATION N/A 07/15/2018   Procedure: SCROTUM DEBRIDEMENT;  Surgeon: Marcine Matar, MD;  Location: [facility] OR;  Service: Urology;  Laterality: N/A;  . SHOULDER SURGERY    . SKIN SPLIT GRAFT Right 09/19/2018   Procedure: Skin Graft Split Thickness;  Surgeon: Peggye Form, DO;  [facility];  Service: Plastics;  Laterality: Right;  . SKIN SPLIT GRAFT N/A 10/03/2018   Procedure: Split thickness skin graft to gluteal area with  acell placement;  Surgeon: Peggye Form, DO;  [facility];  Service: Plastics;  Laterality: N/A;  3 hours, please  . VACUUM ASSISTED CLOSURE CHANGE N/A 07/12/2018   Procedure: ABDOMINAL VACUUM ASSISTED CLOSURE CHANGE and abdominal washout;  Surgeon: Violeta Gelinas, MD;  Location: [facility];  Service: General;  Laterality: N/A;  . WOUND DEBRIDEMENT Left 07/23/2018   Procedure: DEBRIDEMENT LEFT BUTTOCK  WOUND;  Surgeon: Violeta Gelinas, MD;  Location: [facility] OR;  Service: General;  Laterality: Left;  . WOUND EXPLORATION Left 07/10/2018   Procedure: WOUND EXPLORATION LEFT GROIN;  Surgeon: Berna Bue, MD;  Location: [facility] OR;  Service: General;  Laterality: Left;   Past Surgical History:  Procedure Laterality Date  . APPLICATION OF A-CELL OF BACK N/A 08/06/2018   Procedure: Application Of A-Cell Of Back;  Surgeon: Peggye Form, DO;  [facility];  Service: Plastics;  Laterality: N/A;  . APPLICATION OF A-CELL OF EXTREMITY Left 08/06/2018   Procedure: Application Of A-Cell Of Extremity;  Surgeon: Peggye Form, DO;  [facility];  Service: Plastics;  Laterality: Left;  . APPLICATION OF A-CELL OF EXTREMITY Left 09/18/2019   Procedure: APPLICATION OF A-CELL OF EXTREMITY;  Surgeon: Peggye Form, DO;  [facility];  Service: Plastics;  Laterality: Left;  . APPLICATION OF WOUND VAC  07/12/2018   Procedure: Application Of Wound Vac to the Left Thigh and Scrotum.;  Surgeon: Roby Lofts, MD;  [facility];  Service: Orthopedics;;  . APPLICATION OF WOUND VAC  07/10/2018   Procedure: Application Of Wound Vac;  Surgeon: Berna Bue, MD;  [facility];  Service: General;;  . COLON SURGERY  2020   colostomy  . COLOSTOMY N/A 07/23/2018   Procedure: COLOSTOMY;  Surgeon: Violeta Gelinas, MD;  Location: [facility]  OR;  Service: General;  Laterality: N/A;  . CYSTOSCOPY W/ URETERAL STENT PLACEMENT N/A 07/15/2018   Procedure: RETROGRADE URETHROGRAM;  Surgeon: Marcine Matar,  MD;  Location: [facility] OR;  Service: Urology;  Laterality: N/A;  . CYSTOSCOPY WITH LITHOLAPAXY N/A 05/06/2019   Procedure: CYSTOSCOPY BASKET BLADDER STONE EXTRACTION;  Surgeon: Malen Gauze, MD;  Location: [facility];  Service: Urology;  Laterality: N/A;  30 MINS  . CYSTOSTOMY N/A 05/06/2019   Procedure: REPLACEMENT OF SUPRAPUBIC CATHETER;  Surgeon: Malen Gauze, MD;  Location: [facility];  Service: Urology;  Laterality: N/A;  . DEBRIDEMENT AND CLOSURE WOUND Left 03/04/2019   Procedure: Excision of hip wound with placement of Acell;  Surgeon: Peggye Form, DO;  [facility];  Service: Plastics;  Laterality: Left;  . ESOPHAGOGASTRODUODENOSCOPY N/A 08/14/2018   Procedure: ESOPHAGOGASTRODUODENOSCOPY (EGD);  Surgeon: Violeta Gelinas, MD;  Location: [facility] ENDOSCOPY;  Service: General;  Laterality: N/A;  bedside  . FACIAL RECONSTRUCTION SURGERY     X 2--once as a teenager and second time in his 37's  . HARDWARE REMOVAL Left 03/04/2019   Procedure: Left Hip Hardware Removal;  Surgeon: Roby Lofts, MD;  [facility];  Service: Orthopedics;  Laterality: Left;  . HIP PINNING,CANNULATED Left 07/12/2018   Procedure: CANNULATED HIP PINNING;  Surgeon: Roby Lofts, MD;  [facility];  Service: Orthopedics;  Laterality: Left;  . HIP SURGERY    . HOLMIUM LASER APPLICATION Right 07/15/2019   Procedure: HOLMIUM LASER APPLICATION;  Surgeon: Malen Gauze, MD;  Location: [facility];  Service: Urology;  Laterality: Right;  . I & D EXTREMITY Left 07/25/2018   Procedure: Debridement of buttock, scrotum and left leg, placement of acell and vac;  Surgeon: Peggye Form, DO;  [facility];  Service: Plastics;  Laterality: Left;  . I & D EXTREMITY N/A 08/06/2018   Procedure: Debridement of buttock, scrotum and left leg;  Surgeon: Peggye Form, DO;  [facility];  Service: Plastics;  Laterality: N/A;  . I & D EXTREMITY N/A  08/13/2018   Procedure: Debridement of buttock, scrotum and left leg, placement of acell and vac;  Surgeon: Peggye Form, DO;  [facility];  Service: Plastics;  Laterality: N/A;  90 min, please  . INCISION AND DRAINAGE HIP Left 09/18/2019   Procedure: IRRIGATION AND DEBRIDEMENT HIP/ PELVIS WITH WOUND VAC PLACEMENT;  Surgeon: Roby Lofts, MD;  [facility];  Service: Orthopedics;  Laterality: Left;  . INCISION AND DRAINAGE OF WOUND N/A 07/18/2018   Procedure: Debridement of left leg, buttocks and scrotal wound with placement of acell and Flexiseal;  Surgeon: Peggye Form, DO;  [facility];  Service: Plastics;  Laterality: N/A;  . INCISION AND DRAINAGE OF WOUND Left 08/29/2018   Procedure: Debridement of buttock, scrotum and left leg, placement of acell and vac;  Surgeon: Peggye Form, DO;  [facility];  Service: Plastics;  Laterality: Left;  75 min, please  . INCISION AND DRAINAGE OF WOUND Bilateral 10/23/2018   Procedure: DEBRIDEMENT OF BUTTOCK,SCROTUM, AND LEG WOUNDS WITH PLACEMENT OF ACELL- BILATERAL 90 MIN;  Surgeon: Peggye Form, DO;  [facility];  Service: Plastics;  Laterality: Bilateral;  . IR ANGIOGRAM PELVIS SELECTIVE OR SUPRASELECTIVE  07/10/2018  . IR ANGIOGRAM PELVIS SELECTIVE OR SUPRASELECTIVE  07/10/2018  . IR ANGIOGRAM SELECTIVE EACH ADDITIONAL VESSEL  07/10/2018  . IR EMBO ART  VEN HEMORR LYMPH EXTRAV  INC GUIDE ROADMAPPING  07/10/2018  . IR NEPHROSTOMY PLACEMENT LEFT  04/05/2019  . IR NEPHROSTOMY PLACEMENT RIGHT  05/31/2019  . IR US GUIDE BX ASP/DRAIN  07/10/2018  . IR US GUIDE VASC ACCESS RIGHT  07/10/2018  . IR VENO/EXT/UNI LEFT  07/10/2018  . IRRIGATION AND DEBRIDEMENT OF WOUND WITH SPLIT THICKNESS SKIN GRAFT Left 09/19/2018   Procedure: Debridement of gluteal wound with placement of acell to left leg.;  Surgeon: Peggye Form, DO;  [facility];  Service: Plastics;  Laterality: Left;  2.5 hours, please  . LAPAROTOMY N/A  07/12/2018   Procedure: EXPLORATORY LAPAROTOMY;  Surgeon: Violeta Gelinas, MD;  Location: [facility] OR;  Service: General;  Laterality: N/A;  . LAPAROTOMY N/A 07/15/2018   Procedure: WOUND EXPLORATION; CLOSURE OF ABDOMEN;  Surgeon: Violeta Gelinas, MD;  Location: [facility];  Service: General;  Laterality: N/A;  . LAPAROTOMY  07/10/2018   Procedure: Exploratory Laparotomy;  Surgeon: Berna Bue, MD;  Location: [facility] OR;  Service: General;;  . MASS EXCISION Left 09/18/2019   Procedure: EXCISION UPPER LEFT INNER THIGH WOUND;  Surgeon: Peggye Form, DO;  [facility];  Service: Plastics;  Laterality: Left;  . NEPHROLITHOTOMY Right 07/15/2019   Procedure: NEPHROLITHOTOMY PERCUTANEOUS;  Surgeon: Malen Gauze, MD;  Location: [facility];  Service: Urology;  Laterality: Right;  90 MINS  . PEG PLACEMENT N/A 08/14/2018   Procedure: PERCUTANEOUS ENDOSCOPIC GASTROSTOMY (PEG) PLACEMENT;  Surgeon: Violeta Gelinas, MD;  Location: [facility] ENDOSCOPY;  Service: General;  Laterality: N/A;  . PERCUTANEOUS TRACHEOSTOMY N/A 08/02/2018   Procedure: PERCUTANEOUS TRACHEOSTOMY;  Surgeon: Violeta Gelinas, MD;  Location: [facility] OR;  Service: General;  Laterality: N/A;  . RADIOLOGY WITH ANESTHESIA N/A 07/10/2018   Procedure: IR WITH ANESTHESIA;  Surgeon: Simonne Come, MD;  Location: [facility] OR;  Service: Radiology;  Laterality: N/A;  . RADIOLOGY WITH ANESTHESIA Right 07/10/2018   Procedure: Ir With Anesthesia;  Surgeon: Simonne Come, MD;  Location: [facility] OR;  Service: Radiology;  Laterality: Right;  . SCROTAL EXPLORATION N/A 07/15/2018   Procedure: SCROTUM DEBRIDEMENT;  Surgeon: Marcine Matar, MD;  Location: [facility] OR;  Service: Urology;  Laterality: N/A;  . SHOULDER SURGERY    . SKIN SPLIT GRAFT Right 09/19/2018   Procedure: Skin Graft Split Thickness;  Surgeon: Peggye Form, DO;  [facility];  Service: Plastics;  Laterality: Right;  . SKIN SPLIT GRAFT N/A 10/03/2018   Procedure: Split thickness skin graft to gluteal  area with acell placement;  Surgeon: Peggye Form, DO;  [facility];  Service: Plastics;  Laterality: N/A;  3 hours, please  . VACUUM ASSISTED CLOSURE CHANGE N/A 07/12/2018   Procedure: ABDOMINAL VACUUM ASSISTED CLOSURE CHANGE and abdominal washout;  Surgeon: Violeta Gelinas, MD;  Location: [facility] OR;  Service: General;  Laterality: N/A;  . WOUND DEBRIDEMENT Left 07/23/2018   Procedure: DEBRIDEMENT LEFT BUTTOCK  WOUND;  Surgeon: Violeta Gelinas, MD;  Location: [facility] OR;  Service: General;  Laterality: Left;  . WOUND EXPLORATION Left 07/10/2018   Procedure: WOUND EXPLORATION LEFT GROIN;  Surgeon: Berna Bue, MD;  Location: [facility] OR;  Service: General;  Laterality: Left;   Past Medical History:  Diagnosis Date  . Acute on chronic respiratory failure with hypoxia (HCC) 06/2018   trach removed 11-16-2018, on vent from jan until may 2020 - uses albuterol prn  . Anxiety   . Bacteremia due to Pseudomonas 06/2018  . Chronic osteomyelitis (HCC)   . Chronic pain syndrome   .  Clostridium difficile colitis 10/30/2019   tx with abx   . Depression   . DVT (deep venous thrombosis) (HCC) 2020   right brachial post PICC line  . History of blood transfusion 06/2018  . History of kidney stones   . Hypertension    norvasc d/c by pcp on 11/05/19  . Multiple traumatic injuries   . Penile pain 11/18/2019  . Pneumonia 11/2009   2020 x 2  . Dan Humphreys as ambulation aid   . Wheelchair bound    electric  . Wound discharge    left hip wound with bloody/clear drainage change dressing q day surgilube with gauze, between legs wound using calcium algenate pad bid   BP 130/81   Pulse 94   Temp 98.5 F (36.9 C)   SpO2 94%   Opioid Risk Score:   Fall Risk Score:  `1  Depression screen PHQ 2/9  Depression screen [facility] 2/9 12/30/2019 12/18/2019 04/10/2019 12/27/2018  Decreased Interest 0 0 0 2  Down, Depressed, Hopeless 0 0 0 0  PHQ - 2 Score 0 0 0 2  Altered sleeping - - - 1  Tired, decreased energy - - - 2   Change in appetite - - - 1  Feeling bad or failure about yourself  - - - 1  Trouble concentrating - - - 0  Moving slowly or fidgety/restless - - - 2  Suicidal thoughts - - - 0  PHQ-9 Score - - - 9  Some recent data might be hidden     Review of Systems  Musculoskeletal: Positive for gait problem.  Neurological:       Tingling  All other systems reviewed and are negative.      Objective:   Physical Exam  Awake, alert, appropriate, but tearful intermittently, accompanied by wife, NAD Sacrum nonblanchable redness- not open.  In power w/c- joystick on R.  W/C elevating leg rest on R isn't working right.      Assessment & Plan:   1. Sit completely UPRIGHT if in w/c- and then do pressure relief every 15 minutes (1-2 minutes in complete tilt in space position).   2. If choose between getting in bed on side, and staying up in w/c, stay on side in bed (unless pain better in w/c- then change position IN w/c. .   3.  Roll hips in to get weight off sacrum when in w/c.   4. Increase Duragesic to 75 mcg/hour change every 48 hours.   5. After going over pain Sx's again, pain adequately controlled 35% of time- so need to increase long acting- not short acting right now- and will monitor closely for signs of too much pain meds, since on a higher dose.    6.  Zenaida Niece? Timing. Pt needs to be able ot pull up to driver's side and DRIVE this Zenaida Niece! Hand controls for him and allow her to drive as well   7.   Increase Paxil to 40 mg QHS-nightly-  for mood/anxiety/affect.    8.  Build 1 day in a month to have sleeping day-   9. Focus on person C- becoming a survivor-    10. My hope is he will use less oxycodone- but if he's more functional, that's the most important.   11. Will save next appointment for sexuality discussion.   12. F/U in 1 month  I spent a total of 55 minutes visit- as detailed above

## 2020-02-03 NOTE — Patient Instructions (Signed)
1. Sit completely UPRIGHT if in w/c- and then do pressure relief every 15 minutes (1-2 minutes in complete tilt in space position).   2. If choose between getting in bed on side, and staying up in w/c, stay on side in bed (unless pain better in w/c- then change position IN w/c. .   3.  Roll hips in to get weight off sacrum when in w/c.   4. Increase Duragesic to 75 mcg/hour change every 48 hours.   5. After going over pain Sx's again, pain adequately controlled 35% of time- so need to increase long acting- not short acting right now- and will monitor closely for signs of too much pain meds, since on a higher dose.    6.  Zenaida Niece? Timing. Pt needs to be able ot pull up to driver's side and DRIVE this Zenaida Niece! Hand controls for him and allow her to drive as well   7.   Increase Paxil to 40 mg QHS-nightly-  for mood/anxiety/affect.    8.  Build 1 day in a month to have sleeping day-   9. Focus on person C- becoming a survivor-    10. My hope is he will use less oxycodone- but if he's more functional, that's the most important.   11. Will save next appointment for sexuality discussion.   12. F/U in 1 month

## 2020-02-04 ENCOUNTER — Ambulatory Visit: Payer: Self-pay | Admitting: Plastic Surgery

## 2020-02-04 ENCOUNTER — Ambulatory Visit (INDEPENDENT_AMBULATORY_CARE_PROVIDER_SITE_OTHER): Payer: Self-pay | Admitting: Plastic Surgery

## 2020-02-04 ENCOUNTER — Inpatient Hospital Stay: Payer: Self-pay | Admitting: Family Medicine

## 2020-02-04 ENCOUNTER — Encounter: Payer: Self-pay | Admitting: Plastic Surgery

## 2020-02-04 VITALS — BP 124/79 | HR 85 | Temp 97.7°F | Wt 174.0 lb

## 2020-02-04 DIAGNOSIS — S31829D Unspecified open wound of left buttock, subsequent encounter: Secondary | ICD-10-CM

## 2020-02-04 DIAGNOSIS — T07XXXA Unspecified multiple injuries, initial encounter: Secondary | ICD-10-CM
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
## 2020-02-12 NOTE — Telephone Encounter (Signed)
Patient's vancomycin will end on 9/3 [facility] inquiring if patient needs to continue until follow up appointment with Dr. Daiva Eves on 9/16. Routing to [place] for advise.  Valarie Cones

## 2020-02-12 NOTE — Telephone Encounter (Signed)
Yes please continue until seen by Dr. Daiva Eves for now.

## 2020-02-13 NOTE — Telephone Encounter (Signed)
Spoke with Amy and [facility] and gave verbal order per Marcos Eke, NP to extended IV therapy through 02/27/20. Valarie Cones

## 2020-02-21 ENCOUNTER — Telehealth: Payer: Self-pay

## 2020-02-21 NOTE — Telephone Encounter (Signed)
Gave to you.

## 2020-02-21 NOTE — Telephone Encounter (Signed)
Laretta Bolster, NCM called stating she needs an order for patient to have an assessment to purchase a vehicle suitable for his wheelchair and a possible for him to drive. Fax [phone]

## 2020-02-21 NOTE — Telephone Encounter (Signed)
faxed

## 2020-02-24 ENCOUNTER — Encounter: Payer: Self-pay | Admitting: Infectious Disease

## 2020-02-25 ENCOUNTER — Telehealth: Payer: Self-pay | Admitting: *Deleted

## 2020-02-25 ENCOUNTER — Other Ambulatory Visit: Payer: Self-pay | Admitting: Physical Medicine and Rehabilitation

## 2020-02-25 DIAGNOSIS — F411 Generalized anxiety disorder: Secondary | ICD-10-CM

## 2020-02-25 MED ORDER — OXYCODONE-ACETAMINOPHEN 5-325 MG PO TABS: 1.0000 | ORAL_TABLET | ORAL | 0 refills | Status: DC | PRN

## 2020-02-25 MED ORDER — CLONAZEPAM 0.5 MG PO TABS: 0.2500 mg | ORAL_TABLET | Freq: Three times a day (TID) | ORAL | 3 refills | Status: DC | PRN

## 2020-02-25 NOTE — Telephone Encounter (Signed)
Sent, thank you

## 2020-02-25 NOTE — Telephone Encounter (Signed)
Mrs Guess called for a refill on Kden' oxycodone and his clonazepam.  The oxycodone 5/325 was filled 01/17/20 #180 and his clonazepam 0.5mg  was filled 02/18/20 #20.

## 2020-02-26 ENCOUNTER — Telehealth: Payer: Self-pay

## 2020-02-26 NOTE — Telephone Encounter (Signed)
Notified. 

## 2020-02-26 NOTE — Telephone Encounter (Signed)
COVID-19 Pre-Screening Questions:02/26/20 ° °Do you currently have a fever (>100 °F), chills or unexplained body aches? NO ° °Are you currently experiencing new cough, shortness of breath, sore throat, runny nose? NO °•  °Have you been in contact with someone that is currently pending confirmation of Covid19 testing or has been confirmed to have the Covid19 virus? NO ° °**If the patient answers NO to ALL questions -  advise the patient to please call the clinic before coming to the office should any symptoms develop.  ° ° °

## 2020-02-27 ENCOUNTER — Ambulatory Visit: Payer: Self-pay | Admitting: Infectious Disease

## 2020-02-27 ENCOUNTER — Telehealth: Payer: Self-pay

## 2020-02-27 NOTE — Telephone Encounter (Signed)
See below

## 2020-02-27 NOTE — Telephone Encounter (Signed)
Patient is complaining of cough with sputum. Spoke with patient's wife and advised her that the office would not be able to see [name] until he tests negative for COVID. Instructed the patient's wife that she could take [name] to get a drive-thru COVID test or an at home test. Advised the patient's wife that per Dr. Daiva Eves, if she feels he needs to be evaluated today that she should take him to the emergency department or urgent care. Patient will call to reschedule appointment once COVID test has resulted.  Sandie Ano, RN

## 2020-02-27 NOTE — Telephone Encounter (Signed)
Spoke with Melissa at [facility] to relay verbal orders per Dr. Daiva Eves to continue Christopher [name] IV Vancomycin for three more weeks until the patient can reschedule an appointment with the office. Orders repeated and verified.   Sandie Ano, RN

## 2020-02-27 NOTE — Telephone Encounter (Signed)
Were gonna call to request he get tested fro COVID 19 rather than come to clinic. He may need to go to ER or urgent care for evaluation (somewhere w capacity to see possible COVID 19)

## 2020-02-27 NOTE — Telephone Encounter (Signed)
Received call today from home health RN stating area being treated is swollen for two days. Pain and drainage is still the same. Patient's vitals this AM BP: 170/102, Pulse: 132, O2: 88-92.   Rn would also like to inform MD that patient has been coughing red sputum this AM. States patient did have Strawberry jam on toast, and believes sputum color is related to that. Patient's wife is concerned it could be blood due to history of pneumonia.  Mervin Kung, RN P: [phone]

## 2020-02-27 NOTE — Telephone Encounter (Signed)
Pt.'s wife says he has pneumonia like symtpoms. It looks the same as when he last had pneumonia. He has been tested for covid and it was negative. Pt is wheelchair bound , so it is hard for him to get around. Wife is asking if Dr. [name] can order him a chest xray, because due to pain he cannot sit long periods of time in the ER in a wheelchair.

## 2020-02-28 NOTE — Telephone Encounter (Signed)
Please call and check on patient and see if he can be seen by any other provider today.  I am ok with ordering an xray but will likely not get results back until next week.  Katina Degree. [name], MD 02/28/2020 12:36 PM

## 2020-02-28 NOTE — Telephone Encounter (Signed)
Called patient spoke with patient wife, stated ok to see any Provider today. Transfer to front office for appointment

## 2020-02-29 ENCOUNTER — Encounter: Payer: Self-pay | Admitting: Family Medicine

## 2020-02-29 ENCOUNTER — Telehealth (INDEPENDENT_AMBULATORY_CARE_PROVIDER_SITE_OTHER): Payer: Self-pay | Admitting: Family Medicine

## 2020-02-29 ENCOUNTER — Other Ambulatory Visit: Payer: Self-pay

## 2020-02-29 DIAGNOSIS — R0602 Shortness of breath: Secondary | ICD-10-CM

## 2020-02-29 DIAGNOSIS — Z8701 Personal history of pneumonia (recurrent): Secondary | ICD-10-CM

## 2020-02-29 DIAGNOSIS — M86652 Other chronic osteomyelitis, left thigh: Secondary | ICD-10-CM

## 2020-02-29 NOTE — Progress Notes (Signed)
I have discussed the procedure for the virtual visit with the patient who has given consent to proceed with assessment and treatment.  ° °Akia Desroches A Trameka Dorough, CMA ° ° ° ° °
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
## 2020-03-03 ENCOUNTER — Telehealth: Payer: Self-pay | Admitting: *Deleted

## 2020-03-03 NOTE — Telephone Encounter (Signed)
He should be rescheduled with a partner or NP. He is very complicated and in a very vulnerable place and antibiotics SHOULD be continued if aggressive care still desired

## 2020-03-03 NOTE — Telephone Encounter (Signed)
Due to scheduling issues, patient will not be able to follow up with dr Daiva Eves until 11/1.  Please advise 1) if it is ok to wait until 11/1 and 2) if his medication should be continued until that time. Andree Coss, RN

## 2020-03-04 ENCOUNTER — Other Ambulatory Visit: Payer: Self-pay | Admitting: Physical Medicine and Rehabilitation

## 2020-03-04 DIAGNOSIS — F411 Generalized anxiety disorder: Secondary | ICD-10-CM
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
## 2020-03-05 ENCOUNTER — Encounter: Payer: Self-pay | Admitting: Surgical

## 2020-03-05 ENCOUNTER — Ambulatory Visit (INDEPENDENT_AMBULATORY_CARE_PROVIDER_SITE_OTHER): Payer: No Typology Code available for payment source | Admitting: Surgical

## 2020-03-05 ENCOUNTER — Other Ambulatory Visit: Payer: Self-pay

## 2020-03-05 VITALS — BP 132/101 | HR 96 | Temp 98.0°F

## 2020-03-05 DIAGNOSIS — T07XXXA Unspecified multiple injuries, initial encounter: Secondary | ICD-10-CM | POA: Diagnosis not present

## 2020-03-05 DIAGNOSIS — S31809D Unspecified open wound of unspecified buttock, subsequent encounter: Secondary | ICD-10-CM

## 2020-03-06 ENCOUNTER — Encounter: Payer: Self-pay | Admitting: Physical Medicine and Rehabilitation

## 2020-03-06 ENCOUNTER — Encounter
Payer: No Typology Code available for payment source | Attending: Physical Medicine and Rehabilitation | Admitting: Physical Medicine and Rehabilitation

## 2020-03-06 VITALS — BP 144/99 | HR 95 | Temp 97.9°F

## 2020-03-06 DIAGNOSIS — M869 Osteomyelitis, unspecified: Secondary | ICD-10-CM

## 2020-03-06 DIAGNOSIS — M86652 Other chronic osteomyelitis, left thigh: Secondary | ICD-10-CM | POA: Diagnosis not present

## 2020-03-06 DIAGNOSIS — E43 Unspecified severe protein-calorie malnutrition: Secondary | ICD-10-CM | POA: Diagnosis not present

## 2020-03-06 DIAGNOSIS — M792 Neuralgia and neuritis, unspecified: Secondary | ICD-10-CM

## 2020-03-06 DIAGNOSIS — G8921 Chronic pain due to trauma: Secondary | ICD-10-CM
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
## 2020-03-06 NOTE — Progress Notes (Signed)
Subjective:    Patient ID: Christopher [name], male    DOB: 05-Jul-1965, 54 y.o.   MRN: [id number]  HPI  Pt is a 54 yr old male with hx of Multitrauma- causing L femoral neck fx, degloving of L hip to going/scrotum, bladder neck trauma- got SPC, developed compartment syndrome -s/p surgery for that; also diverting colostomy, skin grafts, and s/p trachand PEG- PEG is out. 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 B/L tennis elbow new-and B/L pending/forming ulnar neuropathy Osteomyelitis of L hip found again- on Fetroja IV ABX 3x/day for 3 hours increments. With new Abd pain.epigastric pain.  Pain has increased in last week.  Worker's comp took forever to pick up the new fentanyl changed.   Hasn't noticed any difference with increase in Fentanyl patches- taking same amount of Oxycodone.     Swelling about wound is more- on L hip. Plastic surgeon saw yesterday. Dr Ulice Bold.  Couldn't pull fluid out before.   Saw [facility] - couldn't pull off infected bone- they are now sending him to [facility]- to see if that Orthopedic team can do anything.    Infection is making him nonfunctional, so even if loses function of L leg during surgery, is in sacrum and femoral neck.  Might need to do 5-6 surgeries.      Doesn't think weather changes are cause of increased pain.   Thinks due to swelling of L posterior hip area. Causes more pain.   Hard to know if increased Fentanyl has helped, BUT had increased pain.  Didn't get Fentanyl on time (was on 50 mcg for awhile)- filled 9/1- so likely picked up 9/3- 9/4- so likely only had 20 days and 10 days of that , pain was worse.     Was getting up in w/c pretty well, but not for last 10 days. Not eating a  lot - stomach hurts when eats, no Sx's of C diff.    Last 10 days, moaning- not wanting to get up-  Pushing self ot sit outside, but couldn't do more than 20-30 minutes max- only relief he gets when lays on R side in bed.   Still on Vanc IV 3x/day- and Vanc PO for C diff, and Cipro BID- for Pseudomonas  Didn't get Zenaida Niece yet- Monday appointment to do assessment for van.   Stopped Marinol earlier because stomach growling all the time.   Pain not easing off yet- still grunting/moaning.    Mood no better with increase in Paxil. Bowels not constipated but not loose.   Didn't want to be dependent on fentanyl -  Occ muscle spasms- esp when going to sleep or when asleep- wakes him up.   Is picking at skin. And MAKING wounds-   Pain Inventory Average Pain 8 Pain Right Now 8 My pain is sharp, burning, dull, stabbing, tingling and aching  LOCATION OF PAIN  L hip  BOWEL Number of stools per week: 7 Oral laxative use Yes  Type of laxative senna  Enema or suppository use No  History of colostomy No  Incontinent No   BLADDER Suprapubic In and out cath, frequency n/a Able to self cath No  Bladder incontinence No  Frequent urination No  Leakage with coughing No  Difficulty  starting stream No  Incomplete bladder emptying No    Mobility walk with assistance use a walker use a wheelchair needs help with transfers Do you have any goals in this area?  yes  Function disabled: date disabled . Do you have any goals in this area?  no  Neuro/Psych weakness numbness tingling trouble walking spasms dizziness confusion depression anxiety  Prior Studies Any changes since last visit?  no  Physicians involved in your care Any changes since last visit?  no   Family History  Problem Relation Age of Onset  . Breast cancer Mother        with mets to the bones   Social History   Socioeconomic History  . Marital status: Married    Spouse name: Not on file  . Number of  children: Not on file  . Years of education: Not on file  . Highest education level: Not on file  Occupational History  . Not on file  Tobacco Use  . Smoking status: Former Smoker    Packs/day: 1.00    Years: 20.00    Pack years: 20.00    Types: Cigarettes    Quit date: 07/10/2018    Years since quitting: 1.6  . Smokeless tobacco: Never Used  Vaping Use  . Vaping Use: Never used  Substance and Sexual Activity  . Alcohol use: Never  . Drug use: Yes    Types: Oxycodone, Fentanyl    Comment: Fentanyl patch/oxycodone since 06/2018  . Sexual activity: Yes  Other Topics Concern  . Not on file  Social History Narrative   ** Merged History Encounter **       Social Determinants of Health   Financial Resource Strain:   . Difficulty of Paying Living Expenses: Not on file  Food Insecurity:   . Worried About Programme researcher, broadcasting/film/video in the Last Year: Not on file  . Ran Out of Food in the Last Year: Not on file  Transportation Needs:   . Lack of Transportation (Medical): Not on file  . Lack of Transportation (Non-Medical): Not on file  Physical Activity:   . Days of Exercise per Week: Not on file  . Minutes of Exercise per Session: Not on file  Stress:   . Feeling of Stress : Not on file  Social Connections:   . Frequency of Communication with Friends and Family: Not on file  . Frequency of Social Gatherings with Friends and Family: Not on file  . Attends Religious Services: Not on file  . Active Member of Clubs or Organizations: Not on file  . Attends Banker Meetings: Not on file  . Marital Status: Not on file   Past Surgical History:  Procedure Laterality Date  . APPLICATION OF A-CELL OF BACK N/A 08/06/2018   Procedure: Application Of A-Cell Of Back;  Surgeon: Peggye Form, DO;  [facility];  Service: Plastics;  Laterality: N/A;  . APPLICATION OF A-CELL OF EXTREMITY Left 08/06/2018   Procedure: Application Of A-Cell Of Extremity;  Surgeon: Peggye Form, DO;  [facility];  Service: Plastics;  Laterality: Left;  . APPLICATION OF A-CELL OF EXTREMITY Left 09/18/2019   Procedure: APPLICATION OF A-CELL OF EXTREMITY;  Surgeon: Peggye Form, DO;  [facility]: [facility];  Service: Plastics;  Laterality: Left;  . APPLICATION OF WOUND VAC  07/12/2018   Procedure: Application Of Wound Vac to the Left Thigh and Scrotum.;  Surgeon: Roby Lofts, MD;  [facility];  Service: Orthopedics;;  . APPLICATION OF WOUND VAC  07/10/2018   Procedure: Application Of Wound Vac;  Surgeon: Berna Bueonnor, Chelsea A, MD;  [facility];  Service: General;;  . COLON SURGERY  2020   colostomy  . COLOSTOMY N/A 07/23/2018   Procedure: COLOSTOMY;  Surgeon: Violeta Gelinashompson, Burke, MD;  [facility];  Service: General;  Laterality: N/A;  . CYSTOSCOPY W/ URETERAL STENT PLACEMENT N/A 07/15/2018   Procedure: RETROGRADE URETHROGRAM;  Surgeon: Marcine Matarahlstedt, Stephen, MD;  [facility];  Service: Urology;  Laterality: N/A;  . CYSTOSCOPY WITH LITHOLAPAXY N/A 05/06/2019   Procedure: CYSTOSCOPY BASKET BLADDER STONE EXTRACTION;  Surgeon: Malen GauzeMcKenzie, Patrick L, MD;  [facility] [place];  Service: Urology;  Laterality: N/A;  30 MINS  . CYSTOSTOMY N/A 05/06/2019   Procedure: REPLACEMENT OF SUPRAPUBIC CATHETER;  Surgeon: Malen GauzeMcKenzie, Patrick L, MD;  [facility] [place];  Service: Urology;  Laterality: N/A;  . DEBRIDEMENT AND CLOSURE WOUND Left 03/04/2019   Procedure: Excision of hip wound with placement of Acell;  Surgeon: Peggye Formillingham, Claire S, DO;  [facility];  Service: Plastics;  Laterality: Left;  . ESOPHAGOGASTRODUODENOSCOPY N/A 08/14/2018   Procedure: ESOPHAGOGASTRODUODENOSCOPY (EGD);  Surgeon: Violeta Gelinashompson, Burke, MD;  [facility];  Service: General;  Laterality: N/A;  bedside  . FACIAL RECONSTRUCTION SURGERY     X 2--once as a teenager and second time in his 4730's  . HARDWARE REMOVAL Left 03/04/2019   Procedure: Left Hip Hardware Removal;   Surgeon: Roby LoftsHaddix, Kevin P, MD;  [facility];  Service: Orthopedics;  Laterality: Left;  . HIP PINNING,CANNULATED Left 07/12/2018   Procedure: CANNULATED HIP PINNING;  Surgeon: Roby LoftsHaddix, Kevin P, MD;  [facility];  Service: Orthopedics;  Laterality: Left;  . HIP SURGERY    . HOLMIUM LASER APPLICATION Right 07/15/2019   Procedure: HOLMIUM LASER APPLICATION;  Surgeon: Malen GauzeMcKenzie, Patrick L, MD;  [facility] [place];  Service: Urology;  Laterality: Right;  . I & D EXTREMITY Left 07/25/2018   Procedure: Debridement of buttock, scrotum and left leg, placement of acell and vac;  Surgeon: Peggye Formillingham, Claire S, DO;  [facility];  Service: Plastics;  Laterality: Left;  . I & D EXTREMITY N/A 08/06/2018   Procedure: Debridement of buttock, scrotum and left leg;  Surgeon: Peggye Formillingham, Claire S, DO;  [facility];  Service: Plastics;  Laterality: N/A;  . I & D EXTREMITY N/A 08/13/2018   Procedure: Debridement of buttock, scrotum and left leg, placement of acell and vac;  Surgeon: Peggye Formillingham, Claire S, DO;  [facility];  Service: Plastics;  Laterality: N/A;  90 min, please  . INCISION AND DRAINAGE HIP Left 09/18/2019   Procedure: IRRIGATION AND DEBRIDEMENT HIP/ PELVIS WITH WOUND VAC PLACEMENT;  Surgeon: Roby LoftsHaddix, Kevin P, MD;  [facility];  Service: Orthopedics;  Laterality: Left;  . INCISION AND DRAINAGE OF WOUND N/A 07/18/2018   Procedure: Debridement of left leg, buttocks and scrotal wound with placement of acell and Flexiseal;  Surgeon: Peggye Formillingham, Claire S, DO;  [facility];  Service: Plastics;  Laterality: N/A;  . INCISION AND DRAINAGE OF WOUND Left 08/29/2018   Procedure: Debridement of buttock, scrotum and left leg, placement of acell and vac;  Surgeon: Peggye Formillingham, Claire S, DO;  [facility];  Service: Plastics;  Laterality: Left;  75 min, please  . INCISION AND DRAINAGE OF WOUND Bilateral 10/23/2018   Procedure: DEBRIDEMENT OF BUTTOCK,SCROTUM, AND LEG WOUNDS WITH PLACEMENT OF  ACELL- BILATERAL 90 MIN;  Surgeon: Peggye Form, DO;  [facility];  Service: Plastics;  Laterality: Bilateral;  . IR ANGIOGRAM PELVIS SELECTIVE OR SUPRASELECTIVE  07/10/2018  . IR ANGIOGRAM PELVIS SELECTIVE OR SUPRASELECTIVE  07/10/2018  . IR ANGIOGRAM SELECTIVE EACH ADDITIONAL VESSEL  07/10/2018  . IR EMBO ART  VEN HEMORR LYMPH EXTRAV  INC GUIDE ROADMAPPING  07/10/2018  . IR NEPHROSTOMY PLACEMENT LEFT  04/05/2019  . IR NEPHROSTOMY PLACEMENT RIGHT  05/31/2019  . IR US GUIDE BX ASP/DRAIN  07/10/2018  . IR US GUIDE VASC ACCESS RIGHT  07/10/2018  . IR VENO/EXT/UNI LEFT  07/10/2018  . IRRIGATION AND DEBRIDEMENT OF WOUND WITH SPLIT THICKNESS SKIN GRAFT Left 09/19/2018   Procedure: Debridement of gluteal wound with placement of acell to left leg.;  Surgeon: Peggye Form, DO;  [facility];  Service: Plastics;  Laterality: Left;  2.5 hours, please  . LAPAROTOMY N/A 07/12/2018   Procedure: EXPLORATORY LAPAROTOMY;  Surgeon: Violeta Gelinas, MD;  [facility];  Service: General;  Laterality: N/A;  . LAPAROTOMY N/A 07/15/2018   Procedure: WOUND EXPLORATION; CLOSURE OF ABDOMEN;  Surgeon: Violeta Gelinas, MD;  Location: [facility] OR;  Service: General;  Laterality: N/A;  . LAPAROTOMY  07/10/2018   Procedure: Exploratory Laparotomy;  Surgeon: Berna Bue, MD;  Location[facility] OR;  Service: General;;  . MASS EXCISION Left 09/18/2019   Procedure: EXCISION UPPER LEFT INNER THIGH WOUND;  Surgeon: Peggye Form, DO;  [facility];  Service: Plastics;  Laterality: Left;  . NEPHROLITHOTOMY Right 07/15/2019   Procedure: NEPHROLITHOTOMY PERCUTANEOUS;  Surgeon: Malen Gauze, MD;  Location[facility];  Service: Urology;  Laterality: Right;  90 MINS  . PEG PLACEMENT N/A 08/14/2018   Procedure: PERCUTANEOUS ENDOSCOPIC GASTROSTOMY (PEG) PLACEMENT;  Surgeon: Violeta Gelinas, MD;  Location[facility] END[facility]COPY;  Service: General;  Laterality: N/A;  . PERCUTANEOUS TRACHEOSTOMY N/A 08/02/2018    Procedure: PERCUTANEOUS TRACHEOSTOMY;  Surgeon: Violeta Gelinas, MD;  Location: [facility] OR;  Service: General;  Laterality: N/A;  . RADIOLOGY WITH ANESTHESIA N/A 07/10/2018   Procedure: IR WITH ANESTHESIA;  Surgeon: Simonne Come, MD;  Location: [facility] OR;  Service: Radiology;  Laterality: N/A;  . RADIOLOGY WITH ANESTHESIA Right 07/10/2018   Procedure: Ir With Anesthesia;  Surgeon: Simonne Come, MD;  [facility] OR;  Service: Radiology;  Laterality: Right;  . SCROTAL EXPLORATION N/A 07/15/2018   Procedure: SCROTUM DEBRIDEMENT;  Surgeon: Marcine Matar, MD;  Location: [facility] OR;  Service: Urology;  Laterality: N/A;  . SHOULDER SURGERY    . SKIN SPLIT GRAFT Right 09/19/2018   Procedure: Skin Graft Split Thickness;  Surgeon: Peggye Form, DO;  [facility];  Service: Plastics;  Laterality: Right;  . SKIN SPLIT GRAFT N/A 10/03/2018   Procedure: Split thickness skin graft to gluteal area with acell placement;  Surgeon: Peggye Form, DO;  [facility];  Service: Plastics;  Laterality: N/A;  3 hours, please  . VACUUM ASSISTED CLOSURE CHANGE N/A 07/12/2018   Procedure: ABDOMINAL VACUUM ASSISTED CLOSURE CHANGE and abdominal washout;  Surgeon: Violeta Gelinas, MD;  Location: [facility] OR;  Service: General;  Laterality: N/A;  . WOUND DEBRIDEMENT Left 07/23/2018   Procedure: DEBRIDEMENT LEFT BUTTOCK  WOUND;  Surgeon: Violeta Gelinas, MD;  Location: [facility] OR;  Service: General;  Laterality: Left;  . WOUND EXPLORATION Left 07/10/2018   Procedure: WOUND EXPLORATION LEFT GROIN;  Surgeon: Berna Bue, MD;  Location[facility];  Service: General;  Laterality: Left;   Past Medical History:  Diagnosis Date  . Acute on chronic respiratory failure with hypoxia (HCC) 06/2018   trach removed 11-16-2018, on vent from jan until may 2020 - uses albuterol prn  . Anxiety   . Bacteremia due to Pseudomonas 06/2018  . Chronic osteomyelitis (HCC)   . Chronic pain syndrome   . Clostridium difficile colitis 10/30/2019   tx with  abx   . Depression   . DVT (deep venous thrombosis) (HCC) 2020   right brachial post PICC line  . History of blood transfusion 06/2018  . History of kidney stones   . Hypertension    norvasc d/c by pcp on 11/05/19  . Multiple traumatic injuries   . Penile pain 11/18/2019  . Pneumonia 11/2009   2020 x 2  . Dan Humphreys as ambulation aid   . Wheelchair bound    electric  . Wound discharge    left hip wound with bloody/clear drainage change dressing q day surgilube with gauze, between legs wound using calcium algenate pad bid   BP (!) 144/99   Pulse 95   Temp 97.9 F (36.6 C)   SpO2 94%   Opioid Risk Score:   Fall Risk Score:  `1  Depression screen PHQ 2/9  Depression screen [facility] 2/9 12/30/2019 12/18/2019 04/10/2019 12/27/2018  Decreased Interest 0 0 0 2  Down, Depressed, Hopeless 0 0 0 0  PHQ - 2 Score 0 0 0 2  Altered sleeping - - - 1  Tired, decreased energy - - - 2  Change in appetite - - - 1  Feeling bad or failure about yourself  - - - 1  Trouble concentrating - - - 0  Moving slowly or fidgety/restless - - - 2  Suicidal thoughts - - - 0  PHQ-9 Score - - - 9  Some recent data might be hidden    Review of Systems  Constitutional: Negative.   HENT: Negative.   Respiratory: Negative.   Cardiovascular: Negative.   Gastrointestinal: Negative.   Endocrine: Negative.   Genitourinary: Negative.   Musculoskeletal: Positive for arthralgias, back pain and gait problem.       Spasms   Skin: Positive for wound.  Allergic/Immunologic: Negative.   Neurological: Positive for dizziness, weakness and numbness.       Tingling   Psychiatric/Behavioral: Positive for confusion. Negative for dysphoric mood. The patient is nervous/anxious.        Objective:   Physical Exam  Awake, alert, but less interactive, accompanied by wife, moaning/grunting in pain; in power w/c L hip swelling.  Posterior hip- very swollen.       Assessment & Plan:    Pt is a 54 yr old male with hx of  Multitrauma- causing L femoral neck fx, degloving of L hip to going/scrotum, bladder neck trauma- got SPC, developed compartment syndrome -s/p surgery for that; also diverting colostomy, skin grafts, and s/p trachand PEG- PEG is out. 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 B/L tennis elbow new-and B/L pending/forming ulnar neuropathy Osteomyelitis of L hip found again-on IV vanc, PO vanc and Cipro   1. See if possible to remove L leg/L femoral neck/bone and see if possible if that would help pain/function in the long term.  2. Try marinol 2.5 mg daily only when he  needs.  Only give if eating <50 % of meals that day.  Can give before dinner, for example.   3. Try lidoderm patch- 1 patch- when not so sensitive- to PREVENT it form getting so sensitive.  12 hrs on 12 hrs off.    4. Has appointment for van evaluation on Monday  5.  Will wait on sexuality and intimacy discussion.   6. Did discuss intimacy slightly- intimacy daily!!!   7. Went over addiction vs physical tolerance. And pt might have physical tolerance- don't see addiction.   8. F/U 6 weeks.   9. Wait on changing muscle relaxants, but need  Flexeril in future.   10. 10. Talk to PCP about picking at skin- making himself bleed.   11. Think about compounding a high dose of Lidocaine ointment for L hip.    I spent a total of 50 minutes on visit- as detailed above- Also went over things with Case mgr in room.

## 2020-03-06 NOTE — Telephone Encounter (Signed)
Is there anywhere this patient can be rescheduled with MD asap?  Needs to be seen before 11/1 per DR Daiva Eves. thank you!

## 2020-03-12 ENCOUNTER — Other Ambulatory Visit: Payer: Self-pay

## 2020-03-12 ENCOUNTER — Encounter: Payer: Self-pay | Admitting: Family Medicine

## 2020-03-12 ENCOUNTER — Ambulatory Visit (INDEPENDENT_AMBULATORY_CARE_PROVIDER_SITE_OTHER): Payer: Self-pay | Admitting: Family Medicine

## 2020-03-12 VITALS — BP 124/78 | HR 85 | Temp 98.2°F | Ht 75.0 in

## 2020-03-12 DIAGNOSIS — F064 Anxiety 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: Secondary | ICD-10-CM

## 2020-03-12 DIAGNOSIS — L600 Ingrowing nail: Secondary | ICD-10-CM

## 2020-03-12 DIAGNOSIS — Z23 Encounter for immunization: Secondary | ICD-10-CM

## 2020-03-12 DIAGNOSIS — M2041 Other hammer toe(s) (acquired), right foot: Secondary | ICD-10-CM

## 2020-03-12 DIAGNOSIS — F424 Excoriation (skin-picking) disorder: Secondary | ICD-10-CM | POA: Insufficient documentation

## 2020-03-12 NOTE — Assessment & Plan Note (Signed)
Overall stable.  On Paxil 40 mg daily and Seroquel 50 mg daily.

## 2020-03-12 NOTE — Assessment & Plan Note (Signed)
Patient is already on SSRI and atypical antipsychotic.  Recommended over-the-counter Sarna lotion.  If no improvement will consider topical capsaicin.

## 2020-03-12 NOTE — Progress Notes (Signed)
   Christopher [name] is a 54 y.o. male who presents today for an office visit.  Assessment/Plan:  New/Acute Problems: Ingrown Toenail Removal performed today.  See below procedure note.  Given his hammertoe deformity, I think there is very strong likelihood that this will recur at some point in the future.  Will place referral to podiatry to discuss permanent removal.  Chronic Problems Addressed Today: Skin-picking disorder Patient is already on SSRI and atypical antipsychotic.  Recommended over-the-counter Sarna lotion.  If no improvement will consider topical capsaicin.  Anxiety 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 Overall stable.  On Paxil 40 mg daily and Seroquel 50 mg daily.  Flu vaccine given today.     Subjective:  HPI:  Patient here today with concern for ingrown right second toenail.  Has some pain and swelling to the area.  His wife has tried trimming the area with no improvement.  Also has longstanding issue with skin itching and picking.  Has several nodules on the upper extremities and back related to skin picking.  Patient states that he has an intense itch and need to scratch the area.       Objective:  Physical Exam: BP 124/78   Pulse 85   Temp 98.2 F (36.8 C)   Ht 6\' 3"  (1.905 m)   SpO2 94%   BMI 21.75 kg/m   Gen: No acute distress, resting comfortably MSK: Ingrown right second toenail with hammertoe deformity Skin: Several picker's nodules on upper extremities. Neuro: Grossly normal, moves all extremities Psych: Normal affect and thought content  Toenail Removal Procedure note:  Written consent was obtained. A digital block was performed using 2% lidocaine without epinephrine. Using a tourniquet for hemostasis and sterile instruments, the nail was freed from the nail bed.   This was well tolerated with minimal bleeding. Antibiotic ointment and a dressing were applied. OTC analgesics were recommended pain. Care after instructions were discussed  and handout was given.        . Katina Degree, MD 03/12/2020 10:31 AM

## 2020-03-12 NOTE — Patient Instructions (Signed)
It was very nice to see you today!  We removed your toenail today.  I will place a referral for you to see a podiatrist to discuss permanent removal.  Please try using Sarna lotion for your skin picking/itching.  Let me know if it is not improving in the next week or two.  Take care, Dr [name]  Please try these tips to maintain a healthy lifestyle:   Eat at least 3 REAL meals and 1-2 snacks per day.  Aim for no more than 5 hours between eating.  If you eat breakfast, please do so within one hour of getting up.    Each meal should contain half fruits/vegetables, one quarter protein, and one quarter carbs (no bigger than a computer mouse)   Cut down on sweet beverages. This includes juice, soda, and sweet tea.     Drink at least 1 glass of water with each meal and aim for at least 8 glasses per day   Exercise at least 150 minutes every week.     Fingernail or Toenail Removal, Adult, Care After This sheet gives you information about how to care for yourself after your procedure. Your health care provider may also give you more specific instructions. If you have problems or questions, contact your health care provider. What can I expect after the procedure? After the procedure, it is common to have:  Pain.  Redness.  Swelling.  Soreness. Follow these instructions at home: Medicines  Take over-the-counter and prescription medicines only as told by your health care provider.  If you were prescribed an antibiotic medicine, take or apply it as told by your health care provider. Do not stop using the antibiotic even if you start to feel better. Wound care  Follow instructions from your health care provider about how to take care of your wound. Make sure you: ? Wash your hands with soap and water for at least 20 seconds before and after you change your bandage (dressing). If soap and water are not available, use hand sanitizer. ? Change your dressing as told by your health  care provider. ? Keep your dressing dry until your health care provider says it can be removed.  Check your wound every day for signs of infection. Check for: ? More redness, swelling, or pain. ? More fluid or blood. ? Warmth. ? Pus or a bad smell. If you have a splint:   Wear the splint as told by your health care provider. Remove it only as told by your health care provider.  Loosen the splint if your fingers tingle, become numb, or turn cold and blue.  Keep the splint clean.  If the splint is not waterproof: ? Do not let it get wet. ? Cover it with a watertight covering when you take a bath or a shower. Managing pain, stiffness, and swelling  Move your fingers or toes often to reduce stiffness and swelling.  Raise (elevate) the injured area above the level of your heart while you are sitting or lying down. You may need to keep your hand or foot raised or supported on a pillow for 24 hours or as told by your health care provider. General instructions  If you were given a shoe to wear, wear it as told by your health care provider.  Keep all follow-up visits as told by your health care provider. This is important. Contact a health care provider if:  You have more redness, swelling, or pain around your wound.  You have  more fluid or blood coming from your wound.  You have pus or a bad smell coming from your wound.  Your wound feels warm to the touch.  You have a fever. Get help right away if:  Your finger or toe looks pale, blue, or black.  You are not able to move your finger or toe. Summary  After the procedure, it is common to have pain and swelling.  Keep the hand or foot raised (elevated) or supported on a pillow as told by your health care provider.  Take over-the-counter and prescription medicines only as told by your health care provider.  Check your wound every day for signs of infection. This information is not intended to replace advice given to you by  your health care provider. Make sure you discuss any questions you have with your health care provider. Document Revised: 01/21/2019 Document Reviewed: 01/21/2019 Elsevier Patient Education  2020 [employer].

## 2020-03-13 ENCOUNTER — Other Ambulatory Visit: Payer: Self-pay | Admitting: Family Medicine

## 2020-03-16 ENCOUNTER — Telehealth: Payer: Self-pay | Admitting: *Deleted

## 2020-03-16 NOTE — Telephone Encounter (Signed)
Ok to wait on labs until cathflo. Thanks

## 2020-03-16 NOTE — Telephone Encounter (Signed)
Notified Mary at [facility].

## 2020-03-16 NOTE — Telephone Encounter (Signed)
Home health nurse unable to obtain labs via picc or peripheral stick.  Patient's labs have been stable per Amy at [facility], patient has been able to infuse vancomycin without difficulty. Cathflow has been approved. Patient will be seen 10/5 9am with [name].  Nurse will go out 10/5 afternoon or 10/6 to administer cathflow.  Please advise if ok to wait on labs until after cathflow administered. Andree Coss, RN

## 2020-03-17 ENCOUNTER — Other Ambulatory Visit: Payer: Self-pay

## 2020-03-17 ENCOUNTER — Ambulatory Visit (INDEPENDENT_AMBULATORY_CARE_PROVIDER_SITE_OTHER): Payer: No Typology Code available for payment source | Admitting: Family

## 2020-03-17 ENCOUNTER — Encounter: Payer: Self-pay | Admitting: Family

## 2020-03-17 ENCOUNTER — Telehealth: Payer: Self-pay

## 2020-03-17 DIAGNOSIS — G894 Chronic pain syndrome: Secondary | ICD-10-CM

## 2020-03-17 DIAGNOSIS — Z95828 Presence of other vascular implants and grafts: Secondary | ICD-10-CM | POA: Insufficient documentation

## 2020-03-17 DIAGNOSIS — M869 Osteomyelitis, unspecified: Secondary | ICD-10-CM

## 2020-03-17 DIAGNOSIS — E440 Moderate protein-calorie malnutrition: Secondary | ICD-10-CM

## 2020-03-17 NOTE — Patient Instructions (Signed)
Nice to see you.  Inflammatory markers are looking better.   We will wait on the appointment with [facility].  In the interim will continue with current dose of vancomycin (orally and IV) and ciprofloxacin.  Plan for follow up in 1 month or sooner if needed.   Have a great day!

## 2020-03-17 NOTE — Assessment & Plan Note (Signed)
Christopher [name] appears to be stable at present time with slowly improving inflammatory markers as well as stable pain despite a brief exacerbation about 2 weeks ago.  Discussed it was likely unrelated to worsening infection.  His situation remains very complicated as seen by plastic surgery at [facility] likely requiring multiple surgeries and intense recovery whereas if no surgical intervention is undertaken he runs the risk of worsening infection and instability of his pelvis.  Previous attempts to change to all oral antibiotics have been unsuccessful.  Currently awaiting further potential surgical evaluation by [facility].  Given previous failures of oral antibiotic therapy for suppression I am concerned that this would likely not work long-term in the present state.  Thus far his renal function has remained stable and is tolerating vancomycin, ciprofloxacin, and oral vancomycin.  We discussed treatment plans and recommend we continue with vancomycin, ciprofloxacin, and oral vancomycin at least for the next month while awaiting further surgical evaluations and discussions.  Discussed with Dr. Daiva Eves who is in agreement with this plan. Will notify Home Health of extension of antibiotics. Plan for follow up in 1 month or sooner if needed with Dr. Daiva Eves.

## 2020-03-17 NOTE — Assessment & Plan Note (Signed)
Christopher [name] continues to have pain rated at a 4 out of 10 with some periods of exacerbation.  We discussed that this is not likely due to worsening infection but instead the infection itself as well as any instability that may be occurring because of the infection as well as generalized inflammation depending upon activities.  Continue pain management per primary team.

## 2020-03-17 NOTE — Telephone Encounter (Signed)
Per Marcos Eke, NP's orders called [facility] and then spoke with Amy at [facility]. Orders were then relayed to extend vancomycin for another month up to 04/22/2020. Order was repeated and verified. Crissie Figures
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
## 2020-03-17 NOTE — Assessment & Plan Note (Signed)
Christopher [name] is receiving prostat as a protein supplement and wife says he is eating well.  We will need to optimize protein and nutritional intake if surgery is going to be an option as this is likely to be a extensive rehabilitation process.

## 2020-03-17 NOTE — Assessment & Plan Note (Signed)
PICC line currently does not draw back blood but does flush and administer medications appropriately.  Home health with plans for [facility].  If unsuccessful may need to consider replacement of PICC line.  Continue to monitor.

## 2020-03-18 NOTE — Telephone Encounter (Signed)
Refill request for Fentanyl 75 mcg #2 left, [place] 03/17/2020, wife reported

## 2020-03-20 ENCOUNTER — Telehealth: Payer: Self-pay | Admitting: Physical Medicine and Rehabilitation

## 2020-03-20 MED ORDER — FENTANYL 75 MCG/HR TD PT72: 1.0000 | MEDICATED_PATCH | TRANSDERMAL | 0 refills | Status: DC

## 2020-03-20 MED ORDER — OXYCODONE-ACETAMINOPHEN 5-325 MG PO TABS: 1.0000 | ORAL_TABLET | ORAL | 0 refills | Status: DC | PRN

## 2020-03-20 NOTE — Telephone Encounter (Signed)
Fentanyl last filled 02/14/20 #15 by PMP., Oxycodone 5/325 #180 filled 02/25/20.  Next appt is 04/17/20 with Dr Berline Chough.

## 2020-03-20 NOTE — Telephone Encounter (Signed)
Sent in Percocet and Duragesic patch 75 mcg #15 and 180 percocet

## 2020-03-20 NOTE — Telephone Encounter (Signed)
Patient is needing a refill on his patches.  Please call wife when this is done. She stated she left a message day before yesterday, but no one has called her.

## 2020-03-23 ENCOUNTER — Ambulatory Visit: Payer: Self-pay | Admitting: Podiatry

## 2020-03-23 ENCOUNTER — Telehealth: Payer: Self-pay
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
## 2020-03-23 NOTE — Telephone Encounter (Signed)
Please schedule virtual to discuss with Dr. [name].

## 2020-03-25 ENCOUNTER — Ambulatory Visit: Payer: Self-pay | Admitting: Podiatry

## 2020-03-26 ENCOUNTER — Telehealth: Payer: Self-pay | Admitting: *Deleted

## 2020-03-26 ENCOUNTER — Telehealth (INDEPENDENT_AMBULATORY_CARE_PROVIDER_SITE_OTHER): Payer: Self-pay | Admitting: Family Medicine

## 2020-03-26 DIAGNOSIS — M86152 Other acute osteomyelitis, left femur: Secondary | ICD-10-CM

## 2020-03-26 DIAGNOSIS — Z9189 Other specified personal risk factors, not elsewhere classified: Secondary | ICD-10-CM

## 2020-03-26 DIAGNOSIS — G894 Chronic pain syndrome: Secondary | ICD-10-CM

## 2020-03-26 DIAGNOSIS — M86652 Other chronic osteomyelitis, left thigh: Secondary | ICD-10-CM

## 2020-03-26 MED ORDER — VANCOMYCIN HCL 125 MG PO CAPS: 125.0000 mg | ORAL_CAPSULE | Freq: Four times a day (QID) | ORAL | 1 refills | Status: DC

## 2020-03-26 MED ORDER — ONDANSETRON HCL 8 MG PO TABS: 8.0000 mg | ORAL_TABLET | Freq: Three times a day (TID) | ORAL | 5 refills | Status: DC | PRN

## 2020-03-26 MED ORDER — CIPROFLOXACIN HCL 500 MG PO TABS: 500.0000 mg | ORAL_TABLET | Freq: Two times a day (BID) | ORAL | 1 refills | Status: DC

## 2020-03-26 NOTE — Assessment & Plan Note (Signed)
Continue pain meds.  Likely contributing to nausea.

## 2020-03-26 NOTE — Assessment & Plan Note (Signed)
Continue IV antibiotics per ID.  Likely contributing to nausea.
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
## 2020-03-26 NOTE — Telephone Encounter (Signed)
Patient's wife called to see if [name] refilled the oral vancomycin as well as the oral cipro.  Only IV vancomycin extended.  OK per [name] to refill oral vanc and cipro. Refill sent. Andree Coss, RN

## 2020-03-30 ENCOUNTER — Other Ambulatory Visit: Payer: Self-pay | Admitting: Physical Medicine and Rehabilitation

## 2020-04-08 ENCOUNTER — Inpatient Hospital Stay (HOSPITAL_COMMUNITY)
Admission: EM | Admit: 2020-04-08 | Discharge: 2020-04-11 | DRG: 392 | Disposition: A | Discharge status: Home / Self Care | Payer: No Typology Code available for payment source | Attending: Family Medicine | Admitting: Family Medicine

## 2020-04-08 ENCOUNTER — Other Ambulatory Visit: Payer: Self-pay

## 2020-04-08 ENCOUNTER — Telehealth: Payer: Self-pay

## 2020-04-08 ENCOUNTER — Emergency Department (HOSPITAL_COMMUNITY): Payer: No Typology Code available for payment source

## 2020-04-08 DIAGNOSIS — R140 Abdominal distension (gaseous): Secondary | ICD-10-CM | POA: Diagnosis present

## 2020-04-08 DIAGNOSIS — R109 Unspecified abdominal pain: Secondary | ICD-10-CM | POA: Diagnosis present

## 2020-04-08 DIAGNOSIS — Z86718 Personal history of other venous thrombosis and embolism: Secondary | ICD-10-CM

## 2020-04-08 DIAGNOSIS — Z960 Presence of urogenital implants: Secondary | ICD-10-CM | POA: Diagnosis present

## 2020-04-08 DIAGNOSIS — Z20822 Contact with and (suspected) exposure to covid-19: Secondary | ICD-10-CM | POA: Diagnosis present

## 2020-04-08 DIAGNOSIS — N390 Urinary tract infection, site not specified: Secondary | ICD-10-CM

## 2020-04-08 DIAGNOSIS — Z993 Dependence on wheelchair: Secondary | ICD-10-CM

## 2020-04-08 DIAGNOSIS — T402X5A Adverse effect of other opioids, initial encounter: Secondary | ICD-10-CM

## 2020-04-08 DIAGNOSIS — Z933 Colostomy status: Secondary | ICD-10-CM

## 2020-04-08 DIAGNOSIS — K5903 Drug induced constipation: Secondary | ICD-10-CM | POA: Diagnosis present

## 2020-04-08 DIAGNOSIS — Z87442 Personal history of urinary calculi: Secondary | ICD-10-CM

## 2020-04-08 DIAGNOSIS — R1033 Periumbilical pain: Secondary | ICD-10-CM | POA: Diagnosis not present

## 2020-04-08 DIAGNOSIS — Z792 Long term (current) use of antibiotics: Secondary | ICD-10-CM

## 2020-04-08 DIAGNOSIS — Z79891 Long term (current) use of opiate analgesic: Secondary | ICD-10-CM

## 2020-04-08 DIAGNOSIS — M869 Osteomyelitis, unspecified: Secondary | ICD-10-CM

## 2020-04-08 DIAGNOSIS — I160 Hypertensive urgency: Secondary | ICD-10-CM

## 2020-04-08 DIAGNOSIS — Z87891 Personal history of nicotine dependence: Secondary | ICD-10-CM

## 2020-04-08 DIAGNOSIS — Z79899 Other long term (current) drug therapy: Secondary | ICD-10-CM

## 2020-04-08 DIAGNOSIS — G894 Chronic pain syndrome: Secondary | ICD-10-CM | POA: Diagnosis present

## 2020-04-08 DIAGNOSIS — I10 Essential (primary) hypertension: Secondary | ICD-10-CM | POA: Diagnosis present

## 2020-04-08 DIAGNOSIS — Z803 Family history of malignant neoplasm of breast: Secondary | ICD-10-CM

## 2020-04-08 DIAGNOSIS — Z95828 Presence of other vascular implants and grafts: Secondary | ICD-10-CM

## 2020-04-08 DIAGNOSIS — M86652 Other chronic osteomyelitis, left thigh: Secondary | ICD-10-CM | POA: Diagnosis present

## 2020-04-08 DIAGNOSIS — Z888 Allergy status to other drugs, medicaments and biological substances status: Secondary | ICD-10-CM

## 2020-04-08 DIAGNOSIS — F32A Depression, unspecified: Secondary | ICD-10-CM | POA: Diagnosis present

## 2020-04-08 DIAGNOSIS — M8668 Other chronic osteomyelitis, other site: Secondary | ICD-10-CM | POA: Diagnosis present

## 2020-04-08 DIAGNOSIS — F419 Anxiety disorder, unspecified: Secondary | ICD-10-CM | POA: Diagnosis present

## 2020-04-08 LAB — CBC WITH DIFFERENTIAL/PLATELET
Abs Immature Granulocytes: 0.03 10*3/uL (ref 0.00–0.07)
Basophils Absolute: 0.1 10*3/uL (ref 0.0–0.1)
Basophils Relative: 1 %
Eosinophils Absolute: 0.3 10*3/uL (ref 0.0–0.5)
Eosinophils Relative: 4 %
HCT: 42.5 % (ref 39.0–52.0)
Hemoglobin: 13.2 g/dL (ref 13.0–17.0)
Immature Granulocytes: 0 %
Lymphocytes Relative: 13 %
Lymphs Abs: 1.1 10*3/uL (ref 0.7–4.0)
MCH: 31.1 pg (ref 26.0–34.0)
MCHC: 31.1 g/dL (ref 30.0–36.0)
MCV: 100.2 fL — ABNORMAL HIGH (ref 80.0–100.0)
Monocytes Absolute: 0.4 10*3/uL (ref 0.1–1.0)
Monocytes Relative: 4 %
Neutro Abs: 6.7 10*3/uL (ref 1.7–7.7)
Neutrophils Relative %: 78 %
Platelets: 280 10*3/uL (ref 150–400)
RBC: 4.24 MIL/uL (ref 4.22–5.81)
RDW: 12.9 % (ref 11.5–15.5)
WBC: 8.5 10*3/uL (ref 4.0–10.5)
nRBC: 0 % (ref 0.0–0.2)

## 2020-04-08 LAB — URINALYSIS, ROUTINE W REFLEX MICROSCOPIC
Bilirubin Urine: NEGATIVE
Glucose, UA: NEGATIVE mg/dL
Ketones, ur: NEGATIVE mg/dL
Nitrite: NEGATIVE
Protein, ur: NEGATIVE mg/dL
Specific Gravity, Urine: 1.02 (ref 1.005–1.030)
pH: 5 (ref 5.0–8.0)

## 2020-04-08 LAB — COMPREHENSIVE METABOLIC PANEL
ALT: 102 U/L — ABNORMAL HIGH (ref 0–44)
AST: 31 U/L (ref 15–41)
Albumin: 3.4 g/dL — ABNORMAL LOW (ref 3.5–5.0)
Alkaline Phosphatase: 224 U/L — ABNORMAL HIGH (ref 38–126)
Anion gap: 10 (ref 5–15)
BUN: 14 mg/dL (ref 6–20)
CO2: 25 mmol/L (ref 22–32)
Calcium: 9.7 mg/dL (ref 8.9–10.3)
Chloride: 103 mmol/L (ref 98–111)
Creatinine, Ser: 0.57 mg/dL — ABNORMAL LOW (ref 0.61–1.24)
GFR, Estimated: >60 mL/min (ref >60)
Glucose, Bld: 111 mg/dL — ABNORMAL HIGH (ref 70–99)
Potassium: 3.9 mmol/L (ref 3.5–5.1)
Sodium: 138 mmol/L (ref 135–145)
Total Bilirubin: 0.7 mg/dL (ref 0.3–1.2)
Total Protein: 7.2 g/dL (ref 6.5–8.1)

## 2020-04-08 LAB — LACTIC ACID, PLASMA: Lactic Acid, Venous: 0.7 mmol/L (ref 0.5–1.9)

## 2020-04-08 MED ORDER — MORPHINE SULFATE (PF) 4 MG/ML IV SOLN: 4.0000 mg | Freq: Once | INTRAVENOUS | Status: DC

## 2020-04-08 MED ORDER — KETOROLAC TROMETHAMINE 30 MG/ML IJ SOLN
15.0000 mg | Freq: Once | INTRAMUSCULAR | Status: AC
  Administered 2020-04-08: 15 mg via INTRAVENOUS
  Filled 2020-04-08: qty 1

## 2020-04-08 MED ORDER — SODIUM CHLORIDE 0.9 % IV BOLUS
1000.0000 mL | Freq: Once | INTRAVENOUS | Status: AC
  Administered 2020-04-08: 1000 mL via INTRAVENOUS

## 2020-04-08 MED ORDER — MORPHINE SULFATE (PF) 4 MG/ML IV SOLN
4.0000 mg | Freq: Once | INTRAVENOUS | Status: AC
  Administered 2020-04-08: 4 mg via INTRAVENOUS
  Filled 2020-04-08: qty 1

## 2020-04-08 MED ORDER — PROMETHAZINE HCL 25 MG/ML IJ SOLN
12.5000 mg | Freq: Once | INTRAMUSCULAR | Status: AC
  Administered 2020-04-08: 12.5 mg via INTRAVENOUS
  Filled 2020-04-08: qty 1

## 2020-04-08 MED ORDER — HYDROMORPHONE HCL 1 MG/ML IJ SOLN
1.0000 mg | Freq: Once | INTRAMUSCULAR | Status: AC
  Administered 2020-04-08: 1 mg via INTRAVENOUS
  Filled 2020-04-08: qty 1

## 2020-04-08 MED ORDER — SODIUM CHLORIDE 0.9 % IV SOLN
1.0000 g | Freq: Once | INTRAVENOUS | Status: AC
  Administered 2020-04-09: 1 g via INTRAVENOUS
  Filled 2020-04-08: qty 10

## 2020-04-08 MED ORDER — SODIUM CHLORIDE 0.9% FLUSH: 10.0000 mL | INTRAVENOUS | Status: DC | PRN

## 2020-04-08 MED ORDER — CHLORHEXIDINE GLUCONATE CLOTH 2 % EX PADS
6.0000 | MEDICATED_PAD | Freq: Every day | CUTANEOUS | Status: DC
  Administered 2020-04-10 – 2020-04-11 (×2): 6 via TOPICAL

## 2020-04-08 MED ORDER — SODIUM CHLORIDE 0.9% FLUSH
10.0000 mL | Freq: Two times a day (BID) | INTRAVENOUS | Status: DC
  Administered 2020-04-08 – 2020-04-11 (×4): 10 mL

## 2020-04-08 MED ORDER — ONDANSETRON HCL 4 MG/2ML IJ SOLN
4.0000 mg | Freq: Once | INTRAMUSCULAR | Status: AC
  Administered 2020-04-08: 4 mg via INTRAVENOUS
  Filled 2020-04-08: qty 2

## 2020-04-08 MED ORDER — FENTANYL 75 MCG/HR TD PT72: 1.0000 | MEDICATED_PATCH | TRANSDERMAL | 0 refills | Status: DC

## 2020-04-08 MED ORDER — DICYCLOMINE HCL 10 MG PO CAPS
10.0000 mg | ORAL_CAPSULE | Freq: Once | ORAL | Status: AC
  Administered 2020-04-08: 10 mg via ORAL
  Filled 2020-04-08: qty 1

## 2020-04-08 MED ORDER — IOHEXOL 300 MG/ML  SOLN
100.0000 mL | Freq: Once | INTRAMUSCULAR | Status: AC | PRN
  Administered 2020-04-08: 100 mL via INTRAVENOUS
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
## 2020-04-08 NOTE — ED Notes (Signed)
Pt c/o pain 9/10 in abdomen, MD has ben secured message , pt noted to be in distress at this time

## 2020-04-08 NOTE — ED Triage Notes (Signed)
Pt arrived to ED via [facility] d/t abd pain that he has had for the past week. EMS reports he has an inflammation in his pelvic area, and abscess on his lower left flank area, sediment in his chronic foley and rates his pain 10/10.

## 2020-04-08 NOTE — Telephone Encounter (Signed)
Has refilled Duragesic 75 mcg q2 days- for pt

## 2020-04-09 ENCOUNTER — Telehealth: Payer: Self-pay

## 2020-04-09 ENCOUNTER — Observation Stay (HOSPITAL_COMMUNITY): Payer: Self-pay | Attending: Family Medicine

## 2020-04-09 ENCOUNTER — Encounter (HOSPITAL_COMMUNITY): Payer: Self-pay | Admitting: Internal Medicine

## 2020-04-09 ENCOUNTER — Telehealth: Payer: Self-pay | Admitting: *Deleted

## 2020-04-09 DIAGNOSIS — M86652 Other chronic osteomyelitis, left thigh: Secondary | ICD-10-CM

## 2020-04-09 DIAGNOSIS — Z95828 Presence of other vascular implants and grafts: Secondary | ICD-10-CM | POA: Diagnosis not present

## 2020-04-09 DIAGNOSIS — Z960 Presence of urogenital implants: Secondary | ICD-10-CM | POA: Diagnosis present

## 2020-04-09 DIAGNOSIS — R109 Unspecified abdominal pain: Secondary | ICD-10-CM | POA: Diagnosis present

## 2020-04-09 DIAGNOSIS — I10 Essential (primary) hypertension: Secondary | ICD-10-CM | POA: Diagnosis present

## 2020-04-09 DIAGNOSIS — R1084 Generalized abdominal pain: Secondary | ICD-10-CM

## 2020-04-09 DIAGNOSIS — Z993 Dependence on wheelchair: Secondary | ICD-10-CM | POA: Diagnosis not present

## 2020-04-09 DIAGNOSIS — Z933 Colostomy status: Secondary | ICD-10-CM | POA: Diagnosis not present

## 2020-04-09 DIAGNOSIS — Z888 Allergy status to other drugs, medicaments and biological substances status: Secondary | ICD-10-CM | POA: Diagnosis not present

## 2020-04-09 DIAGNOSIS — M8668 Other chronic osteomyelitis, other site: Secondary | ICD-10-CM | POA: Diagnosis present

## 2020-04-09 DIAGNOSIS — I160 Hypertensive urgency: Secondary | ICD-10-CM | POA: Diagnosis present

## 2020-04-09 DIAGNOSIS — Z87891 Personal history of nicotine dependence: Secondary | ICD-10-CM | POA: Diagnosis not present

## 2020-04-09 DIAGNOSIS — G894 Chronic pain syndrome: Secondary | ICD-10-CM | POA: Diagnosis present

## 2020-04-09 DIAGNOSIS — Z86718 Personal history of other venous thrombosis and embolism: Secondary | ICD-10-CM | POA: Diagnosis not present

## 2020-04-09 DIAGNOSIS — Z803 Family history of malignant neoplasm of breast: Secondary | ICD-10-CM | POA: Diagnosis not present

## 2020-04-09 DIAGNOSIS — R140 Abdominal distension (gaseous): Secondary | ICD-10-CM | POA: Diagnosis present

## 2020-04-09 DIAGNOSIS — K5903 Drug induced constipation: Secondary | ICD-10-CM | POA: Diagnosis present

## 2020-04-09 DIAGNOSIS — Z20822 Contact with and (suspected) exposure to covid-19: Secondary | ICD-10-CM | POA: Diagnosis present

## 2020-04-09 DIAGNOSIS — R1033 Periumbilical pain: Secondary | ICD-10-CM | POA: Diagnosis present

## 2020-04-09 DIAGNOSIS — Z79891 Long term (current) use of opiate analgesic: Secondary | ICD-10-CM | POA: Diagnosis not present

## 2020-04-09 DIAGNOSIS — F419 Anxiety disorder, unspecified: Secondary | ICD-10-CM | POA: Diagnosis present

## 2020-04-09 DIAGNOSIS — Z87442 Personal history of urinary calculi: Secondary | ICD-10-CM | POA: Diagnosis not present

## 2020-04-09 DIAGNOSIS — Z792 Long term (current) use of antibiotics: Secondary | ICD-10-CM | POA: Diagnosis not present

## 2020-04-09 DIAGNOSIS — F32A Depression, unspecified: Secondary | ICD-10-CM | POA: Diagnosis present

## 2020-04-09 DIAGNOSIS — Z79899 Other long term (current) drug therapy: Secondary | ICD-10-CM | POA: Diagnosis not present

## 2020-04-09 LAB — BASIC METABOLIC PANEL
Anion gap: 10 (ref 5–15)
BUN: 14 mg/dL (ref 6–20)
CO2: 23 mmol/L (ref 22–32)
Calcium: 9.7 mg/dL (ref 8.9–10.3)
Chloride: 105 mmol/L (ref 98–111)
Creatinine, Ser: 0.63 mg/dL (ref 0.61–1.24)
GFR, Estimated: >60 mL/min (ref >60)
Glucose, Bld: 95 mg/dL (ref 70–99)
Potassium: 3.7 mmol/L (ref 3.5–5.1)
Sodium: 138 mmol/L (ref 135–145)

## 2020-04-09 LAB — CBC
HCT: 42.5 % (ref 39.0–52.0)
Hemoglobin: 13.3 g/dL (ref 13.0–17.0)
MCH: 31.5 pg (ref 26.0–34.0)
MCHC: 31.3 g/dL (ref 30.0–36.0)
MCV: 100.7 fL — ABNORMAL HIGH (ref 80.0–100.0)
Platelets: 293 10*3/uL (ref 150–400)
RBC: 4.22 MIL/uL (ref 4.22–5.81)
RDW: 12.9 % (ref 11.5–15.5)
WBC: 7 10*3/uL (ref 4.0–10.5)
nRBC: 0 % (ref 0.0–0.2)

## 2020-04-09 LAB — RESPIRATORY PANEL BY RT PCR (FLU A&B, COVID)
Influenza A by PCR: NEGATIVE
Influenza B by PCR: NEGATIVE
SARS Coronavirus 2 by RT PCR: NEGATIVE

## 2020-04-09 LAB — VANCOMYCIN, RANDOM: Vancomycin Rm: 12

## 2020-04-09 MED ORDER — MELATONIN 3 MG PO TABS
3.0000 mg | ORAL_TABLET | Freq: Every day | ORAL | Status: DC
  Administered 2020-04-09 – 2020-04-10 (×2): 3 mg via ORAL
  Filled 2020-04-09 (×3): qty 1

## 2020-04-09 MED ORDER — MORPHINE SULFATE (PF) 4 MG/ML IV SOLN
4.0000 mg | INTRAVENOUS | Status: DC | PRN
  Administered 2020-04-09 – 2020-04-10 (×5): 4 mg via INTRAVENOUS
  Filled 2020-04-09 (×5): qty 1

## 2020-04-09 MED ORDER — FENTANYL 75 MCG/HR TD PT72: 1.0000 | MEDICATED_PATCH | TRANSDERMAL | Status: DC

## 2020-04-09 MED ORDER — DM-GUAIFENESIN ER 30-600 MG PO TB12
2.0000 | ORAL_TABLET | Freq: Two times a day (BID) | ORAL | Status: DC
  Administered 2020-04-09 – 2020-04-11 (×5): 2 via ORAL
  Filled 2020-04-09 (×6): qty 2

## 2020-04-09 MED ORDER — ACETAMINOPHEN 650 MG RE SUPP: 650.0000 mg | Freq: Four times a day (QID) | RECTAL | Status: DC | PRN

## 2020-04-09 MED ORDER — VANCOMYCIN HCL IN DEXTROSE 1-5 GM/200ML-% IV SOLN: 1000.0000 mg | Freq: Once | INTRAVENOUS | Status: DC

## 2020-04-09 MED ORDER — DRONABINOL 2.5 MG PO CAPS: 2.5000 mg | ORAL_CAPSULE | Freq: Two times a day (BID) | ORAL | Status: DC | PRN

## 2020-04-09 MED ORDER — PROBIOTIC COLON SUPPORT PO CAPS: 1.0000 | ORAL_CAPSULE | Freq: Every day | ORAL | Status: DC

## 2020-04-09 MED ORDER — OXYCODONE-ACETAMINOPHEN 5-325 MG PO TABS
1.0000 | ORAL_TABLET | ORAL | Status: DC | PRN
  Administered 2020-04-09: 1 via ORAL
  Filled 2020-04-09: qty 1

## 2020-04-09 MED ORDER — ENOXAPARIN SODIUM 40 MG/0.4ML ~~LOC~~ SOLN
40.0000 mg | SUBCUTANEOUS | Status: DC
  Administered 2020-04-09 – 2020-04-11 (×3): 40 mg via SUBCUTANEOUS
  Filled 2020-04-09 (×3): qty 0.4

## 2020-04-09 MED ORDER — MIRABEGRON ER 25 MG PO TB24
50.0000 mg | ORAL_TABLET | Freq: Every day | ORAL | Status: DC
  Administered 2020-04-09 – 2020-04-11 (×3): 50 mg via ORAL
  Filled 2020-04-09 (×2): qty 2
  Filled 2020-04-09: qty 1

## 2020-04-09 MED ORDER — DICYCLOMINE HCL 10 MG/ML IM SOLN
20.0000 mg | Freq: Three times a day (TID) | INTRAMUSCULAR | Status: DC | PRN
  Administered 2020-04-09: 20 mg via INTRAMUSCULAR
  Filled 2020-04-09 (×4): qty 2

## 2020-04-09 MED ORDER — ALBUTEROL SULFATE HFA 108 (90 BASE) MCG/ACT IN AERS: 2.0000 | INHALATION_SPRAY | RESPIRATORY_TRACT | Status: DC | PRN

## 2020-04-09 MED ORDER — GABAPENTIN 300 MG PO CAPS
300.0000 mg | ORAL_CAPSULE | Freq: Three times a day (TID) | ORAL | Status: DC
  Administered 2020-04-09 – 2020-04-11 (×7): 300 mg via ORAL
  Filled 2020-04-09 (×7): qty 1

## 2020-04-09 MED ORDER — POLYETHYLENE GLYCOL 3350 17 G PO PACK
17.0000 g | PACK | Freq: Two times a day (BID) | ORAL | Status: DC
  Administered 2020-04-09 – 2020-04-11 (×5): 17 g via ORAL
  Filled 2020-04-09 (×5): qty 1

## 2020-04-09 MED ORDER — VANCOMYCIN HCL IN DEXTROSE 1-5 GM/200ML-% IV SOLN
1000.0000 mg | Freq: Three times a day (TID) | INTRAVENOUS | Status: DC
  Administered 2020-04-09 – 2020-04-11 (×6): 1000 mg via INTRAVENOUS
  Filled 2020-04-09 (×8): qty 200

## 2020-04-09 MED ORDER — ONDANSETRON HCL 4 MG/2ML IJ SOLN
4.0000 mg | Freq: Four times a day (QID) | INTRAMUSCULAR | Status: DC | PRN
  Administered 2020-04-09: 4 mg via INTRAVENOUS
  Filled 2020-04-09: qty 2

## 2020-04-09 MED ORDER — CIPROFLOXACIN HCL 500 MG PO TABS
500.0000 mg | ORAL_TABLET | Freq: Two times a day (BID) | ORAL | Status: DC
  Administered 2020-04-09 – 2020-04-11 (×5): 500 mg via ORAL
  Filled 2020-04-09 (×5): qty 1

## 2020-04-09 MED ORDER — ADULT MULTIVITAMIN W/MINERALS CH
1.0000 | ORAL_TABLET | Freq: Every day | ORAL | Status: DC
  Administered 2020-04-09 – 2020-04-11 (×3): 1 via ORAL
  Filled 2020-04-09 (×3): qty 1

## 2020-04-09 MED ORDER — HYDRALAZINE HCL 25 MG PO TABS
25.0000 mg | ORAL_TABLET | Freq: Three times a day (TID) | ORAL | Status: DC | PRN
  Administered 2020-04-09 – 2020-04-11 (×3): 25 mg via ORAL
  Filled 2020-04-09 (×3): qty 1

## 2020-04-09 MED ORDER — METHOCARBAMOL 500 MG PO TABS
500.0000 mg | ORAL_TABLET | Freq: Four times a day (QID) | ORAL | Status: DC | PRN
  Administered 2020-04-09 – 2020-04-11 (×4): 500 mg via ORAL
  Filled 2020-04-09 (×4): qty 1

## 2020-04-09 MED ORDER — PROSOURCE PLUS PO LIQD
30.0000 mL | Freq: Two times a day (BID) | ORAL | Status: DC
  Administered 2020-04-10 – 2020-04-11 (×4): 30 mL via ORAL
  Filled 2020-04-09 (×6): qty 30

## 2020-04-09 MED ORDER — OXYBUTYNIN CHLORIDE 5 MG PO TABS
5.0000 mg | ORAL_TABLET | Freq: Three times a day (TID) | ORAL | Status: DC
  Administered 2020-04-09 – 2020-04-11 (×6): 5 mg via ORAL
  Filled 2020-04-09 (×6): qty 1

## 2020-04-09 MED ORDER — CLONAZEPAM 0.25 MG PO TBDP
0.2500 mg | ORAL_TABLET | Freq: Three times a day (TID) | ORAL | Status: DC | PRN
  Administered 2020-04-09 – 2020-04-11 (×3): 0.25 mg via ORAL
  Filled 2020-04-09 (×3): qty 1

## 2020-04-09 MED ORDER — SENNOSIDES-DOCUSATE SODIUM 8.6-50 MG PO TABS
2.0000 | ORAL_TABLET | Freq: Two times a day (BID) | ORAL | Status: DC
  Administered 2020-04-09 – 2020-04-11 (×5): 2 via ORAL
  Filled 2020-04-09 (×5): qty 2

## 2020-04-09 MED ORDER — SENNA 8.6 MG PO TABS: 2.0000 | ORAL_TABLET | Freq: Every day | ORAL | Status: DC

## 2020-04-09 MED ORDER — KETOROLAC TROMETHAMINE 30 MG/ML IJ SOLN
30.0000 mg | Freq: Four times a day (QID) | INTRAMUSCULAR | Status: DC | PRN
  Administered 2020-04-09: 30 mg via INTRAVENOUS
  Filled 2020-04-09: qty 1

## 2020-04-09 MED ORDER — OXYBUTYNIN CHLORIDE 5 MG PO TABS: 5.0000 mg | ORAL_TABLET | Freq: Every day | ORAL | Status: DC | PRN

## 2020-04-09 MED ORDER — MORPHINE SULFATE (PF) 2 MG/ML IV SOLN
2.0000 mg | INTRAVENOUS | Status: DC | PRN
  Administered 2020-04-09: 2 mg via INTRAVENOUS
  Filled 2020-04-09: qty 1

## 2020-04-09 MED ORDER — VANCOMYCIN 50 MG/ML ORAL SOLUTION
125.0000 mg | Freq: Four times a day (QID) | ORAL | Status: DC
  Administered 2020-04-09 – 2020-04-11 (×8): 125 mg via ORAL
  Filled 2020-04-09 (×12): qty 2.5

## 2020-04-09 MED ORDER — VANCOMYCIN HCL 1000 MG/200ML IV SOLN: 1000.0000 mg | Freq: Three times a day (TID) | INTRAVENOUS | Status: DC

## 2020-04-09 MED ORDER — PRO-STAT SUGAR FREE PO LIQD
30.0000 mL | Freq: Two times a day (BID) | ORAL | Status: DC
  Filled 2020-04-09 (×2): qty 30

## 2020-04-09 MED ORDER — QUETIAPINE FUMARATE 50 MG PO TABS
50.0000 mg | ORAL_TABLET | Freq: Every day | ORAL | Status: DC
  Administered 2020-04-09 – 2020-04-10 (×2): 50 mg via ORAL
  Filled 2020-04-09 (×3): qty 1

## 2020-04-09 MED ORDER — VANCOMYCIN HCL IN DEXTROSE 1-5 GM/200ML-% IV SOLN: 1000.0000 mg | Freq: Three times a day (TID) | INTRAVENOUS | Status: DC

## 2020-04-09 MED ORDER — ACETAMINOPHEN 325 MG PO TABS: 650.0000 mg | ORAL_TABLET | Freq: Four times a day (QID) | ORAL | Status: DC | PRN

## 2020-04-09 MED ORDER — VANCOMYCIN HCL IN DEXTROSE 1-5 GM/200ML-% IV SOLN
1000.0000 mg | Freq: Once | INTRAVENOUS | Status: AC
  Administered 2020-04-09: 1000 mg via INTRAVENOUS
  Filled 2020-04-09: qty 200

## 2020-04-09 MED ORDER — FENTANYL 75 MCG/HR TD PT72
1.0000 | MEDICATED_PATCH | TRANSDERMAL | Status: DC
  Administered 2020-04-10: 1 via TRANSDERMAL
  Filled 2020-04-09: qty 1

## 2020-04-09 MED ORDER — RISAQUAD PO CAPS
1.0000 | ORAL_CAPSULE | Freq: Every day | ORAL | Status: DC
  Administered 2020-04-09 – 2020-04-11 (×3): 1 via ORAL
  Filled 2020-04-09 (×3): qty 1

## 2020-04-09 MED ORDER — PAROXETINE HCL 20 MG PO TABS
40.0000 mg | ORAL_TABLET | Freq: Every day | ORAL | Status: DC
  Administered 2020-04-09 – 2020-04-11 (×3): 40 mg via ORAL
  Filled 2020-04-09 (×3): qty 2

## 2020-04-09 MED ORDER — VANCOMYCIN HCL 125 MG PO CAPS: 125.0000 mg | ORAL_CAPSULE | Freq: Four times a day (QID) | ORAL | Status: DC

## 2020-04-09 NOTE — Telephone Encounter (Signed)
Thank you- I'm so sorry that he feels so bad.

## 2020-04-09 NOTE — ED Notes (Signed)
Pt c/o pain 10/10 was medicated per [facility]

## 2020-04-09 NOTE — Telephone Encounter (Signed)
Called wife- am reaching out to hospitalists right now to see if I can help control his pain better and help with their care. ML I have let wife know this.

## 2020-04-09 NOTE — TOC Initial Note (Signed)
Transition of Care [facility]) - Initial/Assessment Note    Patient Details  Name: ANGELOS WASCO MRN: [id number] Date of Birth: 10-Jun-1966  Transition of Care [facility]) CM/SW Contact:    Kingsley Plan, RN Phone Number: 04/09/2020, 3:37 PM  Clinical Narrative:                 Patient from home with wife.   Patient active with [facility] for IV ABX will need new OPAT script, also active with [facility] for [facility] will need new order for [facility] prior to discharge.   Both agencies aware patient was admitted.   Expected Discharge Plan: Home w Home Health Services Barriers to Discharge: Continued Medical Work up   Patient Goals and CMS Choice Patient states their goals for this hospitalization and ongoing recovery are:: to return to home CMS Medicare.gov Compare Post Acute Care list provided to:: Patient Choice offered to / list presented to : Patient  Expected Discharge Plan and Services Expected Discharge Plan: Home w Home Health Services   Discharge Planning Services: CM Consult Post Acute Care Choice: Home Health Living arrangements for the past 2 months: Single Family Home                 DME Arranged: N/A         HH Arranged: RN          Prior Living Arrangements/Services Living arrangements for the past 2 months: Single Family Home Lives with:: Spouse Patient language and need for interpreter reviewed:: Yes Do you feel safe going back to the place where you live?: Yes      Need for Family Participation in Patient Care: Yes (Comment) Care giver support system in place?: Yes (comment)   Criminal Activity/Legal Involvement Pertinent to Current Situation/Hospitalization: No - Comment as needed  Activities of Daily Living      Permission Sought/Granted   Permission granted to share information with : No              Emotional Assessment Appearance:: Appears stated age Attitude/Demeanor/Rapport: Engaged Affect (typically observed):  Accepting Orientation: : Oriented to Self, Oriented to Place, Oriented to  Time, Oriented to Situation Alcohol / Substance Use: Not Applicable Psych Involvement: No (comment)  Admission diagnosis:  Abdominal pain [R10.9] Urinary tract infection without hematuria, site unspecified [N39.0] Patient Active Problem List   Diagnosis Date Noted  . Abdominal pain 04/09/2020  . Status post peripherally inserted central catheter (PICC) central line placement 03/17/2020  . Skin-picking disorder 03/12/2020  . CHF (congestive heart failure) (HCC) 01/27/2020  . Bone infection of pelvis (HCC) 12/12/2019  . Recurrent pain of right knee 11/08/2019  . Elevated blood pressure reading 11/05/2019  . Acute osteomyelitis of pelvic region and thigh (HCC)   . Acute deep vein thrombosis (DVT) of brachial vein of right upper extremity (HCC) 11/01/2019  . Myofascial pain dysfunction syndrome 08/05/2019  . Protein-calorie malnutrition, severe 06/01/2019  . Chronic osteomyelitis of pelvis, left (HCC) 03/07/2019  . Wound infection, posttraumatic 02/28/2019  . Hydronephrosis concurrent with and due to calculi of kidney and ureter 02/28/2019  . GERD (gastroesophageal reflux disease) 02/28/2019  . Suprapubic catheter (HCC) 02/28/2019  . Protein-calorie malnutrition, moderate (HCC) 02/28/2019  . 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 Reactive depression 01/29/2019  . Chronic pain due to trauma 01/29/2019  . Neuropathic pain 01/29/2019  . Colostomy status (HCC) 01/14/2019  . Insomnia 01/14/2019  . Tachycardia 01/14/2019  . Current use of proton  pump inhibitor 01/14/2019  . Wound of buttock 12/28/2018  . Anxiety and depression 12/17/2018  . Debility 11/23/2018  . Chronic pain syndrome   . Multiple traumatic injuries   . Pressure injury of skin 08/13/2018  . Urethral injury 07/13/2018   PCP:  Ardith Dark, MD Pharmacy:   [id number] - [place], [place] - [address] [facility] [place] [address] Phone: [phone] Fax: [phone]     Social Determinants of Health (SDOH) Interventions    Readmission Risk Interventions Readmission Risk Prevention Plan 11/01/2019 09/18/2019 04/06/2019  Transportation Screening Complete Complete -  PCP or Specialist Appt within 3-5 Days Complete - Complete  HRI or Home Care Consult Complete - Complete  Social Work Consult for Recovery Care Planning/Counseling Complete - Complete  Palliative Care Screening Complete - Not Applicable  Medication Review Oceanographer) Complete Complete Complete  PCP or Specialist appointment within 3-5 days of discharge - Complete -  PCP/Specialist Appt Not Complete comments - - -  HRI or Home Care Consult - Complete -  HRI or Home Care Consult Pt Refusal Comments - - -  SW Recovery Care/Counseling Consult - Complete -  SW Consult Not Complete Comments - - -  Palliative Care Screening - Complete -  Skilled Nursing Facility - Complete -  Some recent data might be hidden

## 2020-04-09 NOTE — Plan of Care (Signed)
  Problem: Education: Goal: Knowledge of General Education information will improve Description Including pain rating scale, medication(s)/side effects and non-pharmacologic comfort measures Outcome: Progressing   

## 2020-04-09 NOTE — ED Notes (Signed)
Lunch Tray Ordered @ 1042. 

## 2020-04-09 NOTE — Progress Notes (Signed)
Pharmacy Antibiotic Note  Christopher [name] is a 54 y.o. male admitted on 04/08/2020 with abdominal pain.  Pharmacy has been consulted for vancomycin dosing. Pt is afebrile, WBC is WNL and Scr is WNL. Pt was on vancomycin + cipro + vancomycin PO PTA which will be continued here.   A random vancomycin level was checked this morning and was slightly low at 12 but pt had missed at least one dose at this point.   Plan: Continue vancomycin 1g IV Q8H F/u renal fxn, C&S, clinical status and trough at SS     Temp (24hrs), Avg:97.8 F (36.6 C), Min:97.7 F (36.5 C), Max:97.9 F (36.6 C)  Recent Labs  Lab 04/08/20 1914 04/08/20 1924 04/09/20 0421 04/09/20 0423  WBC 8.5  --  7.0  --   CREATININE 0.57*  --  0.63  --   LATICACIDVEN  --  0.7  --   --   VANCORANDOM  --   --   --  12    CrCl cannot be calculated (Unknown ideal weight.).    Allergies  Allergen Reactions  . Omnicef [Cefdinir] Nausea And Vomiting    Antimicrobials this admission: Vanc PTA>> Cipro PTA>> Vanc PO PTA>>  Dose adjustments this admission: N/A  Microbiology results: Pending  Thank you for allowing pharmacy to be a part of this patient's care.  Masao Junker, Drake Leach 04/09/2020 12:35 PM

## 2020-04-09 NOTE — Telephone Encounter (Signed)
Christopher [name] called:  She wanted you to know that Christopher [name] went to the hospital yesterday. For  uncontrolled pain.  It appears he was treated for an UTI / abdominal pain. [facility] note is not completed).   Christopher [name], Christopher [name] is aware you are not in the office but will be informed.

## 2020-04-09 NOTE — Telephone Encounter (Signed)
Patient's wife called triage to report that patient is in the ED and planning to be admitted for pain management. Wanted to let Dr. Daiva Eves know and to see if he could assist with patient care. Forwarding to team who is rounding inpatient and will notify wife.   Koby Hartfield Loyola Mast, RN

## 2020-04-09 NOTE — ED Notes (Signed)
Pt continues to c/o nausea. MD paged.

## 2020-04-09 NOTE — Consult Note (Signed)
WOC Nurse ostomy follow up Patient receiving care in [facility] ED H20.  Consult is for ostomy supplies.  The patient is well known to our WOC team. I have placed orders for the following ostomy supplies:  Order the following ostomy supplies:  Pouch, [employer] [place] [phone]; skin barrier, [employer] [place] #2; barrier ring, [employer] [place] [phone]. Order the same number of each of these. Helmut Muster, RN, MSN, CWOCN, CNS-BC, pager [phone]

## 2020-04-09 NOTE — ED Notes (Signed)
Pt c/o nausea, MD notified via secure chat.

## 2020-04-09 NOTE — Telephone Encounter (Signed)
Mrs Harty called back and would really like a call back from Dr Berline Chough to discuss Mr Risinger out of control pain. Her number is [phone].

## 2020-04-09 NOTE — ED Notes (Signed)
MD here to assess pt, pt transported to Xray in stable condition.

## 2020-04-09 NOTE — Telephone Encounter (Signed)
Patient's wife, Mansur Patti, called to let us know that her husband has been admitted to the hospital for uncontrolled pain.

## 2020-04-09 NOTE — ED Notes (Signed)
Pt sitting on the side of the bed eating breakfast.

## 2020-04-09 NOTE — Telephone Encounter (Signed)
Going to see pt in hospital

## 2020-04-09 NOTE — H&P (Signed)
History and Physical    Christopher [name] NOB:[id number] DOB: [birth year] DOA: 04/08/2020  PCP: Vivi Barrack, MD  Patient coming from: Home.  Chief Complaint: Abdominal pain.  HPI: Christopher [name] is a 54 y.o. male with history of chronic osteomyelitis on antibiotics and also has PICC line with chronic indwelling Foley catheter and diverting colostomy presents to the ER because of worsening abdominal pain over the last 1 week mostly periumbilical with no nausea vomiting or increased colostomy output.  Denies fever chills.  Has not had any changes in his medications.  ED Course: In the ER patient was complaining of persistent pain requiring multiple doses of pain medications.  CT abdomen pelvis done did not show any acute except for chronic findings.  UA is concerning for UTI was given 1 dose of ceftriaxone.  Patient admitted for further observation.  Covid test was negative.  Review of Systems: As per HPI, rest all negative.   Past Medical History:  Diagnosis Date   Acute on chronic respiratory failure with hypoxia ([place]) 06/2018   trach removed 11-16-2018, on vent from jan until may 2020 - uses albuterol prn   Anxiety    Bacteremia due to Pseudomonas 06/2018   Chronic osteomyelitis (HCC)    Chronic pain syndrome    Clostridium difficile colitis 10/30/2019   tx with abx    Depression    DVT (deep venous thrombosis) ([place]) 2020   right brachial post PICC line   History of blood transfusion 06/2018   History of kidney stones    Hypertension    norvasc d/c by pcp on 11/05/19   Multiple traumatic injuries    Penile pain 11/18/2019   Pneumonia 11/2009   2020 x 2   Walker as ambulation aid    Wheelchair bound    electric   Wound discharge    left hip wound with bloody/clear drainage change dressing q day surgilube with gauze, between legs wound using calcium algenate pad bid    Past Surgical History:  Procedure Laterality Date   APPLICATION OF A-CELL OF  BACK N/A 08/06/2018   Procedure: Application Of A-Cell Of Back;  Surgeon: [name], DO;  Location: [place];  Service: Plastics;  Laterality: N/A;   APPLICATION OF A-CELL OF EXTREMITY Left 08/06/2018   Procedure: Application Of A-Cell Of Extremity;  Surgeon: [name], DO;  Location: [place];  Service: Plastics;  Laterality: Left;   APPLICATION OF A-CELL OF EXTREMITY Left 09/18/2019   Procedure: APPLICATION OF A-CELL OF EXTREMITY;  Surgeon: [name], DO;  Location: [place];  Service: Plastics;  Laterality: Left;   APPLICATION OF WOUND VAC  07/12/2018   Procedure: Application Of Wound Vac to the Left Thigh and Scrotum.;  Surgeon: Shona Needles, MD;  Location: [place];  Service: Orthopedics;;   APPLICATION OF WOUND VAC  07/10/2018   Procedure: Application Of Wound Vac;  Surgeon: Clovis Riley, MD;  Location: [place];  Service: General;;   COLON SURGERY  2020   colostomy   COLOSTOMY N/A 07/23/2018   Procedure: COLOSTOMY;  Surgeon: Georganna Skeans, MD;  Location: [place];  Service: General;  Laterality: N/A;   CYSTOSCOPY W/ URETERAL STENT PLACEMENT N/A 07/15/2018   Procedure: RETROGRADE URETHROGRAM;  Surgeon: Franchot Gallo, MD;  Location: [place];  Service: Urology;  Laterality: N/A;   CYSTOSCOPY WITH LITHOLAPAXY N/A 05/06/2019   Procedure: CYSTOSCOPY BASKET BLADDER STONE EXTRACTION;  Surgeon: Cleon Gustin, MD;  Location: [facility];  Service: Urology;  Laterality: N/A;  30 MINS   CYSTOSTOMY N/A 05/06/2019   Procedure: REPLACEMENT OF SUPRAPUBIC CATHETER;  Surgeon: Cleon Gustin, MD;  Location[facility];  Service: Urology;  Laterality: N/A;   DEBRIDEMENT AND CLOSURE WOUND Left 03/04/2019   Procedure: Excision of hip wound with placement of Acell;  Surgeon: [name], DO;  Location: [place];  Service: Plastics;  Laterality: Left;   ESOPHAGOGASTRODUODENOSCOPY N/A 08/14/2018   Procedure: ESOPHAGOGASTRODUODENOSCOPY  (EGD);  Surgeon: Georganna Skeans, MD;  Location: [place];  Service: General;  Laterality: N/A;  bedside   FACIAL RECONSTRUCTION SURGERY     X 2--once as a teenager and second time in his 77's   HARDWARE REMOVAL Left 03/04/2019   Procedure: Left Hip Hardware Removal;  Surgeon: Shona Needles, MD;  Location: [place];  Service: Orthopedics;  Laterality: Left;   HIP PINNING,CANNULATED Left 07/12/2018   Procedure: CANNULATED HIP PINNING;  Surgeon: Shona Needles, MD;  Location: [place];  Service: Orthopedics;  Laterality: Left;   HIP SURGERY     HOLMIUM LASER APPLICATION Right 09/15/8097   Procedure: HOLMIUM LASER APPLICATION;  Surgeon: Cleon Gustin, MD;  Location: [facility];  Service: Urology;  Laterality: Right;   I & D EXTREMITY Left 07/25/2018   Procedure: Debridement of buttock, scrotum and left leg, placement of acell and vac;  Surgeon: [name], DO;  Location: [place];  Service: Plastics;  Laterality: Left;   I & D EXTREMITY N/A 08/06/2018   Procedure: Debridement of buttock, scrotum and left leg;  Surgeon: [name], DO;  Location: [place];  Service: Plastics;  Laterality: N/A;   I & D EXTREMITY N/A 08/13/2018   Procedure: Debridement of buttock, scrotum and left leg, placement of acell and vac;  Surgeon: [name], DO;  Location: [place];  Service: Plastics;  Laterality: N/A;  90 min, please   INCISION AND DRAINAGE HIP Left 09/18/2019   Procedure: IRRIGATION AND DEBRIDEMENT HIP/ PELVIS WITH WOUND VAC PLACEMENT;  Surgeon: Shona Needles, MD;  Location: [place];  Service: Orthopedics;  Laterality: Left;   INCISION AND DRAINAGE OF WOUND N/A 07/18/2018   Procedure: Debridement of left leg, buttocks and scrotal wound with placement of acell and Flexiseal;  Surgeon: [name], DO;  Location: [place];  Service: Plastics;  Laterality: N/A;   INCISION AND DRAINAGE OF WOUND Left 08/29/2018   Procedure: Debridement of buttock, scrotum and  left leg, placement of acell and vac;  Surgeon: [name], DO;  Location: [place];  Service: Plastics;  Laterality: Left;  75 min, please   INCISION AND DRAINAGE OF WOUND Bilateral 10/23/2018   Procedure: DEBRIDEMENT OF BUTTOCK,SCROTUM, AND LEG WOUNDS WITH PLACEMENT OF ACELL- BILATERAL 90 MIN;  Surgeon: [name], DO;  Location: [place];  Service: Plastics;  Laterality: Bilateral;   IR ANGIOGRAM PELVIS SELECTIVE OR SUPRASELECTIVE  07/10/2018   IR ANGIOGRAM PELVIS SELECTIVE OR SUPRASELECTIVE  07/10/2018   IR ANGIOGRAM SELECTIVE EACH ADDITIONAL VESSEL  07/10/2018   IR EMBO ART  VEN HEMORR LYMPH EXTRAV  INC GUIDE ROADMAPPING  07/10/2018   IR NEPHROSTOMY PLACEMENT LEFT  04/05/2019   IR NEPHROSTOMY PLACEMENT RIGHT  05/31/2019   IR US GUIDE BX ASP/DRAIN  07/10/2018   IR US GUIDE VASC ACCESS RIGHT  07/10/2018   IR VENO/EXT/UNI LEFT  07/10/2018   IRRIGATION AND DEBRIDEMENT OF WOUND WITH SPLIT THICKNESS SKIN GRAFT Left 09/19/2018  Procedure: Debridement of gluteal wound with placement of acell to left leg.;  Surgeon: [name], DO;  Location: [place];  Service: Plastics;  Laterality: Left;  2.5 hours, please   LAPAROTOMY N/A 07/12/2018   Procedure: EXPLORATORY LAPAROTOMY;  Surgeon: Georganna Skeans, MD;  Location: [place];  Service: General;  Laterality: N/A;   LAPAROTOMY N/A 07/15/2018   Procedure: WOUND EXPLORATION; CLOSURE OF ABDOMEN;  Surgeon: Georganna Skeans, MD;  Location: [place];  Service: General;  Laterality: N/A;   LAPAROTOMY  07/10/2018   Procedure: Exploratory Laparotomy;  Surgeon: Clovis Riley, MD;  Location: [place];  Service: General;;   MASS EXCISION Left 09/18/2019   Procedure: EXCISION UPPER LEFT INNER THIGH WOUND;  Surgeon: [name], DO;  Location: [place];  Service: Plastics;  Laterality: Left;   NEPHROLITHOTOMY Right 07/15/2019   Procedure: NEPHROLITHOTOMY PERCUTANEOUS;  Surgeon: Cleon Gustin, MD;  Location: [facility];   Service: Urology;  Laterality: Right;  90 MINS   PEG PLACEMENT N/A 08/14/2018   Procedure: PERCUTANEOUS ENDOSCOPIC GASTROSTOMY (PEG) PLACEMENT;  Surgeon: Georganna Skeans, MD;  Location: [place];  Service: General;  Laterality: N/A;   PERCUTANEOUS TRACHEOSTOMY N/A 08/02/2018   Procedure: PERCUTANEOUS TRACHEOSTOMY;  Surgeon: Georganna Skeans, MD;  Location: [place];  Service: General;  Laterality: N/A;   RADIOLOGY WITH ANESTHESIA N/A 07/10/2018   Procedure: IR WITH ANESTHESIA;  Surgeon: Sandi Mariscal, MD;  Location: [place];  Service: Radiology;  Laterality: N/A;   RADIOLOGY WITH ANESTHESIA Right 07/10/2018   Procedure: Ir With Anesthesia;  Surgeon: Sandi Mariscal, MD;  Location: [place];  Service: Radiology;  Laterality: Right;   SCROTAL EXPLORATION N/A 07/15/2018   Procedure: SCROTUM DEBRIDEMENT;  Surgeon: Franchot Gallo, MD;  Location: [place];  Service: Urology;  Laterality: N/A;   SHOULDER SURGERY     SKIN SPLIT GRAFT Right 09/19/2018   Procedure: Skin Graft Split Thickness;  Surgeon: [name], DO;  Location: [place];  Service: Plastics;  Laterality: Right;   SKIN SPLIT GRAFT N/A 10/03/2018   Procedure: Split thickness skin graft to gluteal area with acell placement;  Surgeon: [name], DO;  Location: [place];  Service: Plastics;  Laterality: N/A;  3 hours, please   VACUUM ASSISTED CLOSURE CHANGE N/A 07/12/2018   Procedure: ABDOMINAL VACUUM ASSISTED CLOSURE CHANGE and abdominal washout;  Surgeon: Georganna Skeans, MD;  Location: [place];  Service: General;  Laterality: N/A;   WOUND DEBRIDEMENT Left 07/23/2018   Procedure: DEBRIDEMENT LEFT BUTTOCK  WOUND;  Surgeon: Georganna Skeans, MD;  Location: [place];  Service: General;  Laterality: Left;   WOUND EXPLORATION Left 07/10/2018   Procedure: WOUND EXPLORATION LEFT GROIN;  Surgeon: Clovis Riley, MD;  Location: [place];  Service: General;  Laterality: Left;     reports that he quit smoking about 21 months ago. His smoking use  included cigarettes. He has a 20.00 pack-year smoking history. He has never used smokeless tobacco. He reports current drug use. Drugs: Oxycodone and Fentanyl. He reports that he does not drink alcohol.  Allergies  Allergen Reactions   Omnicef [Cefdinir] Nausea And Vomiting    Family History  Problem Relation Age of Onset   Breast cancer Mother        with mets to the bones    Prior to Admission medications   Medication Sig Start Date End Date Taking? Authorizing Provider  albuterol (VENTOLIN HFA) 108 (90 Base) MCG/ACT inhaler INHALE 2 PUFFS BY MOUTH EVERY 6 HOURS  AS NEEDED FOR WHEEZE OR SHORTNESS OF BREATH 03/13/20   Vivi Barrack, MD  Amino Acids-Protein Hydrolys (FEEDING SUPPLEMENT, PRO-STAT SUGAR FREE 64,) LIQD Take 30 mLs by mouth 3 (three) times daily with meals. Patient taking differently: Take 30 mLs by mouth 2 (two) times daily.  03/26/19   Vivi Barrack, MD  Blood Pressure Monitoring (BLOOD PRESSURE CUFF) MISC Use daily as needed to check blood pressure. 11/05/19   Vivi Barrack, MD  ciprofloxacin (CIPRO) 500 MG tablet Take 1 tablet (500 mg total) by mouth 2 (two) times daily. 03/26/20   Golden Circle, FNP  clonazePAM (KLONOPIN) 0.5 MG tablet Take 0.5 tablets (0.25 mg total) by mouth 3 (three) times daily as needed for anxiety. 02/25/20   Raulkar, Clide Deutscher, MD  dextromethorphan-guaiFENesin (MUCINEX DM) 30-600 MG 12hr tablet Take 2 tablets by mouth 2 (two) times daily.    [provider]  dronabinol (MARINOL) 2.5 MG capsule TAKE 1 CAPSULE (2.5 MG TOTAL) BY MOUTH 2 (TWO) TIMES DAILY BEFORE A MEAL. Patient taking differently: Take 2.5 mg by mouth 2 (two) times daily as needed (to encourage appetite).  07/29/19   Lovorn, Jinny Blossom, MD  fentaNYL (DURAGESIC) 75 MCG/HR Place 1 patch onto the skin every other day. 04/08/20   Lovorn, Jinny Blossom, MD  gabapentin (NEURONTIN) 300 MG capsule TAKE 1 CAPSULE BY MOUTH 3 TIMES A DAY Patient taking differently: Take 300 mg by mouth 3  (three) times daily.  10/01/19   Lovorn, Jinny Blossom, MD  melatonin ([employer] MELATONIN) 3 MG TABS tablet Take 1 tablet (3 mg total) by mouth at bedtime. 11/01/19   Matcha, Beverely Pace, MD  methocarbamol (ROBAXIN) 500 MG tablet TAKE 1 TABLET BY MOUTH EVERY 6 HOURS AS NEEDED FOR MUSCLE SPASMS Patient taking differently: Take 500 mg by mouth every 6 (six) hours as needed for muscle spasms.  11/06/19   Lovorn, Jinny Blossom, MD  mirabegron ER (MYRBETRIQ) 50 MG TB24 tablet Take 50 mg by mouth daily.    [provider]  Multiple Vitamin (MULTIVITAMIN WITH MINERALS) TABS tablet Take 1 tablet by mouth daily. 03/13/19   Kinnie Feil, MD  naloxone [facility]) nasal spray 4 mg/0.1 mL To use if pt develops unconsciousness or confusion that family thinks is related to opioids. Patient taking differently: Place into the nose See admin instructions. "To use if pt develops unconsciousness or confusion that family thinks is related to opioids" 12/30/19   Lovorn, Jinny Blossom, MD  ondansetron (ZOFRAN) 8 MG tablet Take 1 tablet (8 mg total) by mouth every 8 (eight) hours as needed for nausea, vomiting or refractory nausea / vomiting (max dose in a day is 24 mg total). 03/26/20 03/26/21  Vivi Barrack, MD  oxybutynin (DITROPAN) 5 MG tablet Take 5 mg by mouth See admin instructions. Take 5 mg by mouth three times a day and an additional 5 mg once daily as needed for urinary urgency 11/13/19   [provider]  oxyCODONE-acetaminophen (PERCOCET) 5-325 MG tablet Take 1 tablet by mouth every 3 (three) hours as needed for severe pain. 03/20/20   Lovorn, Jinny Blossom, MD  PARoxetine (PAXIL) 40 MG tablet TAKE 1 TABLET BY MOUTH EVERYDAY AT BEDTIME - PA IN PROGRESS 03/30/20   Lovorn, Jinny Blossom, MD  Probiotic Product (PROBIOTIC COLON SUPPORT) CAPS Take 1 capsule by mouth daily.    [provider]  QUEtiapine (SEROQUEL) 50 MG tablet Take 50 mg by mouth at bedtime.  05/11/19   [provider]  Respiratory Therapy Supplies (SPIROMETER) KIT Use  3-4 times per hour. 11/05/19   Vivi Barrack, MD  senna (SENOKOT) 8.6 MG tablet Take 2 tablets by mouth at bedtime.     [provider]  vancomycin (VANCOCIN) 125 MG capsule Take 1 capsule (125 mg total) by mouth 4 (four) times daily. 03/26/20   Golden Circle, FNP  Vancomycin HCl 1000 MG/200ML SOLN Inject 200 mLs (1,000 mg total) into the vein every 8 (eight) hours. 01/01/20   Kuppelweiser, Cassie L, RPH-CPP  dicyclomine (BENTYL) 20 MG tablet Take 1 tablet (20 mg total) by mouth 4 (four) times daily -  before meals and at bedtime. Patient not taking: Reported on 01/13/2020 11/01/19 01/13/20  Yaakov Guthrie, MD    Physical Exam: Constitutional: Moderately built and nourished. Vitals:   04/08/20 1828 04/08/20 1959  BP:  (!) 153/101  Pulse:  75  Resp:  (!) 21  Temp:  97.9 F (36.6 C)  TempSrc:  Oral  SpO2: 98% 95%   Eyes: Anicteric no pallor. ENMT: No discharge from the ears eyes nose or mouth. Neck: No mass felt.  No neck rigidity. Respiratory: No rhonchi or crepitations. Cardiovascular: S1-S2 heard. Abdomen: Soft nontender colostomy bag seen. Musculoskeletal: No edema. Skin: No rash. Neurologic: Alert awake oriented to time place and person. Psychiatric: Appears normal.   Labs on Admission: I have personally reviewed following labs and imaging studies  CBC: Recent Labs  Lab 04/08/20 1914  WBC 8.5  NEUTROABS 6.7  HGB 13.2  HCT 42.5  MCV 100.2*  PLT 500   Basic Metabolic Panel: Recent Labs  Lab 04/08/20 1914  NA 138  K 3.9  CL 103  CO2 25  GLUCOSE 111*  BUN 14  CREATININE 0.57*  CALCIUM 9.7   GFR: CrCl cannot be calculated (Unknown ideal weight.). Liver Function Tests: Recent Labs  Lab 04/08/20 1914  AST 31  ALT 102*  ALKPHOS 224*  BILITOT 0.7  PROT 7.2  ALBUMIN 3.4*   No results for input(s): LIPASE, AMYLASE in the last 168 hours. No results for input(s): AMMONIA in the last 168 hours. Coagulation Profile: No results for input(s): INR,  PROTIME in the last 168 hours. Cardiac Enzymes: No results for input(s): CKTOTAL, CKMB, CKMBINDEX, TROPONINI in the last 168 hours. BNP (last 3 results) No results for input(s): PROBNP in the last 8760 hours. HbA1C: No results for input(s): HGBA1C in the last 72 hours. CBG: No results for input(s): GLUCAP in the last 168 hours. Lipid Profile: No results for input(s): CHOL, HDL, LDLCALC, TRIG, CHOLHDL, LDLDIRECT in the last 72 hours. Thyroid Function Tests: No results for input(s): TSH, T4TOTAL, FREET4, T3FREE, THYROIDAB in the last 72 hours. Anemia Panel: No results for input(s): VITAMINB12, FOLATE, FERRITIN, TIBC, IRON, RETICCTPCT in the last 72 hours. Urine analysis:    Component Value Date/Time   COLORURINE YELLOW 04/08/2020 1925   APPEARANCEUR CLOUDY (A) 04/08/2020 1925   LABSPEC 1.020 04/08/2020 1925   PHURINE 5.0 04/08/2020 1925   GLUCOSEU NEGATIVE 04/08/2020 1925   HGBUR MODERATE (A) 04/08/2020 1925   BILIRUBINUR NEGATIVE 04/08/2020 1925   KETONESUR NEGATIVE 04/08/2020 1925   PROTEINUR NEGATIVE 04/08/2020 1925   NITRITE NEGATIVE 04/08/2020 1925   LEUKOCYTESUR LARGE (A) 04/08/2020 1925   Sepsis Labs: '@LABRCNTIP' (procalcitonin:4,lacticidven:4) ) Recent Results (from the past 240 hour(s))  Respiratory Panel by RT PCR (Flu A&B, Covid) - Nasopharyngeal Swab     Status: None   Collection Time: 04/08/20 11:34 PM   Specimen: Nasopharyngeal Swab  Result Value Ref Range Status  SARS Coronavirus 2 by RT PCR NEGATIVE NEGATIVE Final    Comment: (NOTE) SARS-CoV-2 target nucleic acids are NOT DETECTED.  The SARS-CoV-2 RNA is generally detectable in upper respiratoy specimens during the acute phase of infection. The lowest concentration of SARS-CoV-2 viral copies this assay can detect is 131 copies/mL. A negative result does not preclude SARS-Cov-2 infection and should not be used as the sole basis for treatment or other patient management decisions. A negative result may occur  with  improper specimen collection/handling, submission of specimen other than nasopharyngeal swab, presence of viral mutation(s) within the areas targeted by this assay, and inadequate number of viral copies (<131 copies/mL). A negative result must be combined with clinical observations, patient history, and epidemiological information. The expected result is Negative.  Fact Sheet for Patients:  PinkCheek.be  Fact Sheet for Healthcare Providers:  GravelBags.it  This test is no t yet approved or cleared by the [place] FDA and  has been authorized for detection and/or diagnosis of SARS-CoV-2 by FDA under an Emergency Use Authorization (EUA). This EUA will remain  in effect (meaning this test can be used) for the duration of the COVID-19 declaration under Section 564(b)(1) of the Act, 21 U.S.C. section 360bbb-3(b)(1), unless the authorization is terminated or revoked sooner.     Influenza A by PCR NEGATIVE NEGATIVE Final   Influenza B by PCR NEGATIVE NEGATIVE Final    Comment: (NOTE) The Xpert Xpress SARS-CoV-2/FLU/RSV assay is intended as an aid in  the diagnosis of influenza from Nasopharyngeal swab specimens and  should not be used as a sole basis for treatment. Nasal washings and  aspirates are unacceptable for Xpert Xpress SARS-CoV-2/FLU/RSV  testing.  Fact Sheet for Patients: PinkCheek.be  Fact Sheet for Healthcare Providers: GravelBags.it  This test is not yet approved or cleared by the [place] FDA and  has been authorized for detection and/or diagnosis of SARS-CoV-2 by  FDA under an Emergency Use Authorization (EUA). This EUA will remain  in effect (meaning this test can be used) for the duration of the  Covid-19 declaration under Section 564(b)(1) of the Act, 21  U.S.C. section 360bbb-3(b)(1), unless the authorization is  terminated or  revoked. Performed at [facility], [place] [address], [place], [place] [address]      Radiological Exams on Admission: CT ABDOMEN PELVIS W CONTRAST  Result Date: 04/08/2020 CLINICAL DATA:  Distension history of flank abscess EXAM: CT ABDOMEN AND PELVIS WITH CONTRAST TECHNIQUE: Multidetector CT imaging of the abdomen and pelvis was performed using the standard protocol following bolus administration of intravenous contrast. CONTRAST:  168m OMNIPAQUE IOHEXOL 300 MG/ML  SOLN COMPARISON:  CT 01/27/2020, 01/13/2020, 03/07/2019, 05/30/2019, 04/27/2019 FINDINGS: Lower chest: Small chronic slightly thick rimmed left pleural effusion with mild calcification. Stable left anterior pleural thickening. Atelectasis or scarring in the right base. Round atelectasis and or scar in the left lung base without change. Normal cardiac size. Hepatobiliary: No focal liver abnormality is seen. No gallstones, gallbladder wall thickening, or biliary dilatation. Pancreas: Unremarkable. No pancreatic ductal dilatation or surrounding inflammatory changes. Spleen: Normal in size without focal abnormality. Adrenals/Urinary Tract: Left adrenal gland is normal stable 11 mm right adrenal gland nodule. Kidneys show no hydronephrosis. Subcentimeter hypodense lesions in the left kidney too small to further characterize. Suprapubic catheter in the bladder with small amount of urine in the bladder. Stomach/Bowel: Stomach is nonenlarged. No dilated small bowel. Large amount of stool in the colon. Left lower quadrant colostomy. No acute bowel wall  thickening. Rectosigmoid stump is decompressed. Vascular/Lymphatic: Mild aortic atherosclerosis without aneurysm. Vascular coils in the right pelvis. No suspicious adenopathy Reproductive: Prostate is unremarkable. Other: No free air. Chronic presacral soft tissue thickening and stranding. Musculoskeletal: Multiple chronic fracture deformities of the pelvis including bilateral pubic rami fractures.  Chronic postsurgical and posttraumatic deformity of the left iliac bone. Surgical hardware in the proximal left femur and within the left acetabulum. Skin thickening and soft tissue infiltration of the left gluteal region and hip without significant change. Large, deep decubitus ulcer on the left, extends to a large bony defect within the left iliac bone. With widening of left SI joint and soft tissue density in the region. Bony destructive changes of the left sacrum and ilium as before. Heterogenous appearance of the right iliac bone associated with ghost track from prior screw fixation, likely related to prior history of osteomyelitis in the region. IMPRESSION: 1. No CT evidence for acute intra-abdominal or pelvic abnormality. 2. Extensive posttraumatic and postsurgical deformities of the left hemipelvis. Large, deep left decubitus ulcer which extends to a large chronic bony defect in the left iliac bone and involves the SI joint. Bony destructive changes of left sacrum and iliac bone, likely sequela of osteomyelitis, though appearance not significantly changed when compared to most recent priors. Chronic skin thickening and soft tissue infiltration of the left gluteal region and hip. No discrete soft tissue abscess by CT. 3. Status post left lower quadrant colostomy. Negative for bowel obstruction or bowel wall thickening. 4. Small chronic left pleural effusion with probable round atelectasis and scarring in the left base. Aortic Atherosclerosis (ICD10-I70.0). Electronically Signed   By: Donavan Foil M.D.   On: 04/08/2020 23:51     Assessment/Plan Principal Problem:   Abdominal pain Active Problems:   Chronic pain syndrome   Chronic osteomyelitis of pelvis, left (HCC)    1. Abdominal pain cause not clear.  Has received multiple pain medicine doses in the ER.  CT abdomen pelvis does not show anything acute shows chronic features.  UA is concerning for UTI was given 1 dose of ceftriaxone I will patient  is already on antibiotics Cipro and vancomycin which we will continue to follow cultures.  If pain persist may consider GI consult. 2. History of chronic osteomyelitis of the pelvis on vancomycin and Cipro which I discussed with pharmacy about monitoring. 3. Chronic pain syndrome on fentanyl patch and also takes oxycodone presently on IV morphine. 4. Previous history of DVT.   DVT prophylaxis: Lovenox. Code Status: Full code. Family Communication: Discussed with patient. Disposition Plan: Back to home. Consults called: None. Admission status: Observation.   Rise Patience MD [facility] Pager [phone].  If 7PM-7AM, please contact night-coverage [email or web address] Password [facility]  04/09/2020, 3:53 AM

## 2020-04-09 NOTE — Progress Notes (Signed)
Patient seen and examined at bedside, patient admitted after midnight, please see earlier detailed admission note by Eduard Clos, MD. Briefly, patient presented secondary to acute on chronic abdominal and flank pain. Flank pain appears to be secondary to underlying pelvic infection and abdominal pain appears to be secondary to large stool burden and resultant constipation. CT abdomen/pelvis was not suggests of any other acute process. No concern for bowel perforation.  BP (!) 177/102   Pulse 98   Temp 97.8 F (36.6 C) (Oral)   Resp 17   SpO2 97%   General exam: Appears calm and uncomfortable Respiratory system: Clear to auscultation. Respiratory effort normal. Cardiovascular system: S1 & S2 heard, RRR. No murmurs, rubs, gallops or clicks. Gastrointestinal system: Abdomen is nondistended, soft and generally tender with voluntary guarding. No organomegaly or masses felt. Normal bowel sounds heard. Left flank with large tender mass and no underlying erythema/ecchymosis Central nervous system: Alert and oriented. No focal neurological deficits. Musculoskeletal: No edema. No calf tenderness Skin: No cyanosis. No rashes Psychiatry: Judgement and insight appear normal. Mood & affect appropriate.   A/P:  Abdominal pain Probably secondary to constipation. Guarding on exam but appears to be voluntary as patient states he does it to keep his stomach from hurting. Pain is sharp. Urinalysis was suggestive for possible urine infection with no symptoms. Given Ceftriaxone and urine culture is pending. Patient is s/p colostomy -Continue fentanyl/morphine -Miralax BID, Senokot-S BID  Left flank pain Pelvic osteomyelitis Patient follows with ID and is on Vancomycin/Ciprofloxacin for treatment. Patient with PICC. -Continue Vancomycin/Ciprofloaxacin  Dispo: anticipate discharge home in 1-3 days if able to improve pain after patient has several bowel movements   Jacquelin Hawking, MD [facility] 04/09/2020, 2:29 PM

## 2020-04-10 MED ORDER — OXYCODONE-ACETAMINOPHEN 5-325 MG PO TABS
1.0000 | ORAL_TABLET | ORAL | Status: DC | PRN
  Administered 2020-04-10 – 2020-04-11 (×4): 1 via ORAL
  Filled 2020-04-10 (×4): qty 1

## 2020-04-10 NOTE — Progress Notes (Signed)
PROGRESS NOTE    Christopher [name]  TKZ:[id number] DOB: 15-Aug-1965 DOA: 04/08/2020 PCP: Ardith Dark, MD   Brief Narrative: Christopher [name] is a 54 y.o. male with history of chronic osteomyelitis on antibiotics and also has PICC line with chronic indwelling Foley catheter and diverting colostomy. Patient presented secondary to abdominal pain which appears to be secondary to significant constipation. Bowel regimen initiated and patient is being managed on IV analgesics.   Assessment & Plan:   Principal Problem:   Abdominal pain Active Problems:   Chronic pain syndrome   Chronic osteomyelitis of pelvis, left (HCC)   Abdominal pain Probably secondary to constipation. Guarding on exam but appears to be voluntary as patient states he does it to keep his stomach from hurting. Pain is sharp. Urinalysis was suggestive for possible urine infection with no symptoms. Given Ceftriaxone and urine culture is pending. Patient is s/p colostomy. Improvement in pain with bowel regimen inpatient and increased stool output -Continue fentanyl patch/morphine IV -Added Toradol IV prn x 5 days -Add home Percocet PO -Miralax BID, Senokot-S BID  Left flank pain Pelvic osteomyelitis Patient follows with ID and is on Vancomycin/Ciprofloxacin for treatment. Patient with PICC. -Continue Vancomycin/Ciprofloaxacin  Chronic pain syndrome Patient follows with PM&R. Currently on Fentanyl patch and Percocet for chronic pain management with associated bowel regimen.  Chronic foley Noted.   DVT prophylaxis: Lovenox Code Status:   Code Status: Full Code Family Communication: None at bedside Disposition Plan: Discharge likely in 1-3 days pending ability to manage pain with oral medication   Consultants:   None  Procedures:   None  Antimicrobials:  Vancomycin  Ciprofloxacin    Subjective: Abdominal pain is improved from admission but still present. Feels his abdomen is  softer.  Objective: Vitals:   04/09/20 1832 04/09/20 1915 04/09/20 2302 04/10/20 0302  BP: (!) 174/94 (!) 173/117 (!) 139/107 (!) 142/101  Pulse: (!) 102 (!) 110 79 82  Resp: 18  18 18   Temp: 97.9 F (36.6 C) (!) 97.5 F (36.4 C) 97.9 F (36.6 C) 97.7 F (36.5 C)  TempSrc: Oral Oral Oral Oral  SpO2: 99% 97% 95% 96%    Intake/Output Summary (Last 24 hours) at 04/10/2020 1422 Last data filed at 04/10/2020 0646 Gross per 24 hour  Intake 400 ml  Output 850 ml  Net -450 ml   There were no vitals filed for this visit.  Examination:  General exam: Appears calm and comfortable Respiratory system: Clear to auscultation. Respiratory effort normal. Cardiovascular system: S1 & S2 heard, RRR. No murmurs, rubs, gallops or clicks. Gastrointestinal system: Abdomen is slightly distended, soft and nontender. Decreased bowel sounds heard. Central nervous system: Alert and oriented. No focal neurological deficits. Musculoskeletal: No edema. No calf tenderness Skin: No cyanosis. No rashes Psychiatry: Judgement and insight appear normal. Mood & affect appropriate.     Data Reviewed: I have personally reviewed following labs and imaging studies  CBC Lab Results  Component Value Date   WBC 7.0 04/09/2020   RBC 4.22 04/09/2020   HGB 13.3 04/09/2020   HCT 42.5 04/09/2020   MCV 100.7 (H) 04/09/2020   MCH 31.5 04/09/2020   PLT 293 04/09/2020   MCHC 31.3 04/09/2020   RDW 12.9 04/09/2020   LYMPHSABS 1.1 04/08/2020   MONOABS 0.4 04/08/2020   EOSABS 0.3 04/08/2020   BASOSABS 0.1 04/08/2020     Last metabolic panel Lab Results  Component Value Date   NA 138 04/09/2020   K 3.7 04/09/2020  CL 105 04/09/2020   CO2 23 04/09/2020   BUN 14 04/09/2020   CREATININE 0.63 04/09/2020   GLUCOSE 95 04/09/2020   GFRNONAA >60 04/09/2020   GFRAA >60 01/28/2020   CALCIUM 9.7 04/09/2020   PHOS 2.2 (L) 08/20/2018   PROT 7.2 04/08/2020   ALBUMIN 3.4 (L) 04/08/2020   BILITOT 0.7 04/08/2020    ALKPHOS 224 (H) 04/08/2020   AST 31 04/08/2020   ALT 102 (H) 04/08/2020   ANIONGAP 10 04/09/2020    CBG (last 3)  No results for input(s): GLUCAP in the last 72 hours.   GFR: CrCl cannot be calculated (Unknown ideal weight.).  Coagulation Profile: No results for input(s): INR, PROTIME in the last 168 hours.  Recent Results (from the past 240 hour(s))  Blood culture (routine x 2)     Status: None (Preliminary result)   Collection Time: 04/08/20  8:00 PM   Specimen: BLOOD  Result Value Ref Range Status   Specimen Description BLOOD SITE NOT SPECIFIED  Final   Special Requests   Final    BOTTLES DRAWN AEROBIC AND ANAEROBIC Blood Culture adequate volume   Culture   Final    NO GROWTH < 12 HOURS Performed at [facility], [address], [place], [place] [address]    Report Status PENDING  Incomplete  Urine culture     Status: None (Preliminary result)   Collection Time: 04/08/20  8:07 PM   Specimen: Urine, Random  Result Value Ref Range Status   Specimen Description URINE, RANDOM  Final   Special Requests NONE  Final   Culture   Final    CULTURE REINCUBATED FOR BETTER GROWTH Performed at [facility], [address], [place], [place] [address]    Report Status PENDING  Incomplete  Blood culture (routine x 2)     Status: None (Preliminary result)   Collection Time: 04/08/20  9:35 PM   Specimen: BLOOD  Result Value Ref Range Status   Specimen Description BLOOD SITE NOT SPECIFIED  Final   Special Requests   Final    BOTTLES DRAWN AEROBIC AND ANAEROBIC Blood Culture adequate volume   Culture   Final    NO GROWTH < 12 HOURS Performed at [facility], [address] [place], [place] [address]    Report Status PENDING  Incomplete  Respiratory Panel by RT PCR (Flu A&B, Covid) - Nasopharyngeal Swab     Status: None   Collection Time: 04/08/20 11:34 PM   Specimen: Nasopharyngeal Swab  Result Value Ref Range Status   SARS Coronavirus 2 by RT PCR NEGATIVE  NEGATIVE Final    Comment: (NOTE) SARS-CoV-2 target nucleic acids are NOT DETECTED.  The SARS-CoV-2 RNA is generally detectable in upper respiratoy specimens during the acute phase of infection. The lowest concentration of SARS-CoV-2 viral copies this assay can detect is 131 copies/mL. A negative result does not preclude SARS-Cov-2 infection and should not be used as the sole basis for treatment or other patient management decisions. A negative result may occur with  improper specimen collection/handling, submission of specimen other than nasopharyngeal swab, presence of viral mutation(s) within the areas targeted by this assay, and inadequate number of viral copies (<131 copies/mL). A negative result must be combined with clinical observations, patient history, and epidemiological information. The expected result is Negative.  Fact Sheet for Patients:  https://www.moore.com/  Fact Sheet for Healthcare Providers:  https://www.young.biz/  This test is no t yet approved or cleared by  the [employer] and  has been authorized for detection and/or diagnosis of SARS-CoV-2 by FDA under an Emergency Use Authorization (EUA). This EUA will remain  in effect (meaning this test can be used) for the duration of the COVID-19 declaration under Section 564(b)(1) of the Act, 21 U.S.C. section 360bbb-3(b)(1), unless the authorization is terminated or revoked sooner.     Influenza A by PCR NEGATIVE NEGATIVE Final   Influenza B by PCR NEGATIVE NEGATIVE Final    Comment: (NOTE) The Xpert Xpress SARS-CoV-2/FLU/RSV assay is intended as an aid in  the diagnosis of influenza from Nasopharyngeal swab specimens and  should not be used as a sole basis for treatment. Nasal washings and  aspirates are unacceptable for Xpert Xpress SARS-CoV-2/FLU/RSV  testing.  Fact Sheet for Patients: https://www.moore.com/  Fact Sheet for Healthcare  Providers: https://www.young.biz/  This test is not yet approved or cleared by the [place] FDA and  has been authorized for detection and/or diagnosis of SARS-CoV-2 by  FDA under an Emergency Use Authorization (EUA). This EUA will remain  in effect (meaning this test can be used) for the duration of the  Covid-19 declaration under Section 564(b)(1) of the Act, 21  U.S.C. section 360bbb-3(b)(1), unless the authorization is  terminated or revoked. Performed at [facility], [address], [place], [place] [address]         Radiology Studies: CT ABDOMEN PELVIS W CONTRAST  Result Date: 04/08/2020 CLINICAL DATA:  Distension history of flank abscess EXAM: CT ABDOMEN AND PELVIS WITH CONTRAST TECHNIQUE: Multidetector CT imaging of the abdomen and pelvis was performed using the standard protocol following bolus administration of intravenous contrast. CONTRAST:  OMNIPAQUE IOHEXOL 300 MG/ML  SOLN COMPARISON:  CT 01/27/2020, 01/13/2020, 03/07/2019, 05/30/2019, 04/27/2019 FINDINGS: Lower chest: Small chronic slightly thick rimmed left pleural effusion with mild calcification. Stable left anterior pleural thickening. Atelectasis or scarring in the right base. Round atelectasis and or scar in the left lung base without change. Normal cardiac size. Hepatobiliary: No focal liver abnormality is seen. No gallstones, gallbladder wall thickening, or biliary dilatation. Pancreas: Unremarkable. No pancreatic ductal dilatation or surrounding inflammatory changes. Spleen: Normal in size without focal abnormality. Adrenals/Urinary Tract: Left adrenal gland is normal stable 11 mm right adrenal gland nodule. Kidneys show no hydronephrosis. Subcentimeter hypodense lesions in the left kidney too small to further characterize. Suprapubic catheter in the bladder with small amount of urine in the bladder. Stomach/Bowel: Stomach is nonenlarged. No dilated small bowel. Large amount of stool  in the colon. Left lower quadrant colostomy. No acute bowel wall thickening. Rectosigmoid stump is decompressed. Vascular/Lymphatic: Mild aortic atherosclerosis without aneurysm. Vascular coils in the right pelvis. No suspicious adenopathy Reproductive: Prostate is unremarkable. Other: No free air. Chronic presacral soft tissue thickening and stranding. Musculoskeletal: Multiple chronic fracture deformities of the pelvis including bilateral pubic rami fractures. Chronic postsurgical and posttraumatic deformity of the left iliac bone. Surgical hardware in the proximal left femur and within the left acetabulum. Skin thickening and soft tissue infiltration of the left gluteal region and hip without significant change. Large, deep decubitus ulcer on the left, extends to a large bony defect within the left iliac bone. With widening of left SI joint and soft tissue density in the region. Bony destructive changes of the left sacrum and ilium as before. Heterogenous appearance of the right iliac bone associated with ghost track from prior screw fixation, likely related to prior history of osteomyelitis in the region. IMPRESSION: 1. No  CT evidence for acute intra-abdominal or pelvic abnormality. 2. Extensive posttraumatic and postsurgical deformities of the left hemipelvis. Large, deep left decubitus ulcer which extends to a large chronic bony defect in the left iliac bone and involves the SI joint. Bony destructive changes of left sacrum and iliac bone, likely sequela of osteomyelitis, though appearance not significantly changed when compared to most recent priors. Chronic skin thickening and soft tissue infiltration of the left gluteal region and hip. No discrete soft tissue abscess by CT. 3. Status post left lower quadrant colostomy. Negative for bowel obstruction or bowel wall thickening. 4. Small chronic left pleural effusion with probable round atelectasis and scarring in the left base. Aortic Atherosclerosis  (ICD10-I70.0). Electronically Signed   By: Jasmine PangKim  Fujinaga M.D.   On: 04/08/2020 23:51   DG ABD ACUTE 2+V W 1V CHEST  Result Date: 04/09/2020 CLINICAL DATA:  Abdominal distension. EXAM: DG ABDOMEN ACUTE WITH 1 VIEW CHEST COMPARISON:  04/08/2020 CT abdomen pelvis. FINDINGS: Left upper extremity PICC tip overlies the cavoatrial junction. Hypoinflated lungs with left basilar atelectasis. No pneumothorax or pleural effusion. Cardiomediastinal silhouette within normal limits. Moderate to large stool burden. Nonobstructive bowel gas pattern. No pneumoperitoneum. Left lower quadrant ostomy. Postsurgical appearance of the left hemipelvis and proximal left femur. Right hemi pelvic coil embolization material. IMPRESSION: Nonobstructive bowel gas pattern.  Large stool burden. Left basilar atelectasis. Electronically Signed   By: Stana Buntinghikanele  Emekauwa M.D.   On: 04/09/2020 12:29        Scheduled Meds:  (feeding supplement) PROSource Plus  30 mL Oral BID BM   acidophilus  1 capsule Oral Daily   Chlorhexidine Gluconate Cloth  6 each Topical Daily   ciprofloxacin  500 mg Oral BID   dextromethorphan-guaiFENesin  2 tablet Oral BID   enoxaparin (LOVENOX) injection  40 mg Subcutaneous Q24H   fentaNYL  1 patch Transdermal Q48H   gabapentin  300 mg Oral TID   melatonin  3 mg Oral QHS   mirabegron ER  50 mg Oral Daily   multivitamin with minerals  1 tablet Oral Daily   oxybutynin  5 mg Oral TID   PARoxetine  40 mg Oral Daily   polyethylene glycol  17 g Oral BID   QUEtiapine  50 mg Oral QHS   senna-docusate  2 tablet Oral BID   sodium chloride flush  10-40 mL Intracatheter Q12H   vancomycin  125 mg Oral Q6H   Continuous Infusions:  vancomycin 1,000 mg (04/10/20 0700)     LOS: 1 day     Jacquelin Hawkingalph Izick Gasbarro, MD [facility] 04/10/2020, 2:22 PM  If 7PM-7AM, please contact night-coverage [email or web address]

## 2020-04-11 DIAGNOSIS — I160 Hypertensive urgency: Secondary | ICD-10-CM

## 2020-04-11 DIAGNOSIS — K5903 Drug induced constipation: Secondary | ICD-10-CM

## 2020-04-11 LAB — BASIC METABOLIC PANEL
Anion gap: 8 (ref 5–15)
BUN: 13 mg/dL (ref 6–20)
CO2: 27 mmol/L (ref 22–32)
Calcium: 9.3 mg/dL (ref 8.9–10.3)
Chloride: 106 mmol/L (ref 98–111)
Creatinine, Ser: 0.6 mg/dL — ABNORMAL LOW (ref 0.61–1.24)
GFR, Estimated: >60 mL/min (ref >60)
Glucose, Bld: 93 mg/dL (ref 70–99)
Potassium: 3.3 mmol/L — ABNORMAL LOW (ref 3.5–5.1)
Sodium: 141 mmol/L (ref 135–145)

## 2020-04-11 LAB — URINE CULTURE: Culture: >=100000 — AB

## 2020-04-11 MED ORDER — POLYETHYLENE GLYCOL 3350 17 G PO PACK: PACK | ORAL | 0 refills | Status: AC

## 2020-04-11 MED ORDER — VANCOMYCIN IV (FOR PTA / DISCHARGE USE ONLY): 1000.0000 mg | Freq: Three times a day (TID) | INTRAVENOUS | 0 refills | Status: AC

## 2020-04-11 MED ORDER — SENNOSIDES-DOCUSATE SODIUM 8.6-50 MG PO TABS: ORAL_TABLET | ORAL | 0 refills | Status: AC

## 2020-04-11 MED ORDER — AMLODIPINE BESYLATE 10 MG PO TABS
10.0000 mg | ORAL_TABLET | Freq: Every day | ORAL | Status: DC
  Administered 2020-04-11: 10 mg via ORAL
  Filled 2020-04-11: qty 1

## 2020-04-11 MED ORDER — AMLODIPINE BESYLATE 10 MG PO TABS
10.0000 mg | ORAL_TABLET | Freq: Every day | ORAL | 0 refills | Status: DC
Start: 2020-04-12 — End: 2020-06-25

## 2020-04-11 MED ORDER — HEPARIN SOD (PORK) LOCK FLUSH 100 UNIT/ML IV SOLN
250.0000 [IU] | INTRAVENOUS | Status: AC | PRN
  Administered 2020-04-11: 250 [IU]
  Filled 2020-04-11: qty 2.5

## 2020-04-11 NOTE — Discharge Instructions (Signed)
Christopher [name],  You were in the hospital because of severe abdominal pain. This appears to be because of constipation. Your symptoms have improved with a more aggressive bowel regimen. Please continue as recommended. Your blood pressure was also high. Please take amlodipine daily and check your blood pressure daily.

## 2020-04-11 NOTE — TOC Transition Note (Signed)
Transition of Care [facility]) - CM/SW Discharge Note   Patient Details  Name: KSEAN VALE MRN: [id number] Date of Birth: May 12, 1966  Transition of Care [facility]) CM/SW Contact:  Epifanio Lesches, RN Phone Number: 04/11/2020, 1:18 PM   Clinical Narrative:    Patient will DC to: Home Anticipated DC date: 04/11/2020 Family notified: wife Transport by: Sharin Mons   Per MD patient ready for DC toDAY . RN, patient, and patient's wife notified of DC. DC packet on chart. Ambulance transport requested for patient.   RNCM will sign off for now as intervention is no longer needed. Please consult [place] again if new needs arise.   Zyrus Hetland (Spouse)     [phone]        Final next level of care: Home w Home Health Services Barriers to Discharge: No Barriers Identified   Patient Goals and CMS Choice Patient states their goals for this hospitalization and ongoing recovery are:: to return to home CMS Medicare.gov Compare Post Acute Care list provided to:: Patient Choice offered to / list presented to : Patient  Discharge Placement                       Discharge Plan and Services   Discharge Planning Services: CM Consult Post Acute Care Choice: Home Health          DME Arranged:  (IV ABX THERAPY) DME Agency: Other - [facility]) Date DME Agency Contacted: 04/11/20 Time DME Agency Contacted: 1316 Representative spoke with at DME Agency: [name] HH Arranged: RN HH Agency: [facility] Date HH Agency Contacted: 04/11/20 Time HH Agency Contacted: 1316 Representative spoke with at [facility] Agency: Barbara Cower  Social Determinants of Health (SDOH) Interventions     Readmission Risk Interventions Readmission Risk Prevention Plan 11/01/2019 09/18/2019 04/06/2019  Transportation Screening Complete Complete -  PCP or Specialist Appt within 3-5 Days Complete - Complete  HRI or Home Care Consult Complete - Complete  Social Work Consult for Recovery Care  Planning/Counseling Complete - Complete  Palliative Care Screening Complete - Not Applicable  Medication Review [employer]) Complete Complete Complete  PCP or Specialist appointment within 3-5 days of discharge - Complete -  PCP/Specialist Appt Not Complete comments - - -  HRI or Home Care Consult - Complete -  HRI or Home Care Consult Pt Refusal Comments - - -  SW Recovery Care/Counseling Consult - Complete -  SW Consult Not Complete Comments - - -  Palliative Care Screening - Complete -  Skilled Nursing Facility - Complete -  Some recent data might be hidden

## 2020-04-11 NOTE — Progress Notes (Signed)
PHARMACY CONSULT NOTE FOR:  OUTPATIENT  PARENTERAL ANTIBIOTIC THERAPY (OPAT)  Indication: chronic osteomyelitis Regimen: vancomycin 1 gm IV q8h End date: 04/18/2020  IV antibiotic discharge orders are pended. To discharging provider:  please sign these orders via discharge navigator,  Select New Orders & click on the button choice - Manage This Unsigned Work.     Thank you for allowing pharmacy to be a part of this patient's care.  Lulu Riding, PharmD PGY2 Pharmacy Resident Phone between 7 am - 3:30 pm: [phone]  Please check AMION for all [facility] Pharmacy phone numbers After 10:00 PM, call Main Pharmacy [phone]  04/11/2020, 12:49 PM

## 2020-04-11 NOTE — Progress Notes (Signed)
Patient discharged to home with instructions given to patient and also given to his wife by phone, D/C instructions copy was sent with him and his IV antibiotic prescription. Transported via [employer]

## 2020-04-11 NOTE — Discharge Summary (Signed)
Physician Discharge Summary  KELSON QUEENAN YOM:[id number] DOB: [birth year] DOA: 04/08/2020  PCP: Vivi Barrack, MD  Admit date: 04/08/2020 Discharge date: 04/11/2020  Admitted From: Home Disposition: Home  Recommendations for Outpatient Follow-up:  1. Follow up with PCP in 1 week 2. Please follow up on the following pending results: None  Discharge Condition: Stable CODE STATUS: Full code Diet recommendation: Heart healthy   Brief/Interim Summary:  Admission HPI written by Rise Patience, MD   Chief Complaint: Abdominal pain.  HPI: Christopher ARISMENDEZ is a 54 y.o. male with history of chronic osteomyelitis on antibiotics and also has PICC line with chronic indwelling Foley catheter and diverting colostomy presents to the ER because of worsening abdominal pain over the last 1 week mostly periumbilical with no nausea vomiting or increased colostomy output.  Denies fever chills.  Has not had any changes in his medications.  Hospital course:  Abdominal pain Drug-induced constipation CT imaging was negative for acute process explaining abdominal pain. Urinalysis was suggestive for possible urine infection with no symptoms. Given Ceftriaxone and urine culture is pending. Patient is s/p colostomy. Improvement in pain with bowel regimen inpatient and increased stool output. Patient was able to tolerate pain without using IV analgesics for about 24 hours prior to discharge. Discharge regimen of Miralax BID and Senokot-S 2 tabs BID for the next 5 days with taper down to daily dosing.  Left flank pain Pelvic osteomyelitis Patient follows with ID and is on Vancomycin/Ciprofloxacin for treatment. Patient with PICC. Continue Vancomycin PO & IV/Ciprofloaxacin IV  Chronic pain syndrome Patient follows with PM&R. Currently on Fentanyl patch and Percocet for chronic pain management with associated bowel regimen.  Hypertensive urgency Significantly elevated blood pressure.  Managed with hydralazine PRN while inpatient and started on Amlodipine 10 mg daily. Per wife, this happens when he is in the hospital. They have a blood pressure cuff at home and will be monitoring his blood pressure. Management to continue per PCP recommendations.  Chronic foley Noted.  Asymptomatic funguria Yeast on urine culture. Patient without fever, leukocytosis or suprapubic tenderness. In setting of chronic foley. No treatment unless he becomes symptomatic. Abdominal pain is more likely explained by constipation.  Discharge Diagnoses:  Principal Problem:   Drug induced constipation Active Problems:   Chronic pain syndrome   Chronic osteomyelitis of pelvis, left (HCC)   Abdominal pain   Hypertensive urgency    Discharge Instructions  Discharge Instructions    Increase activity slowly   Complete by: As directed      Allergies as of 04/11/2020      Reactions   Omnicef [cefdinir] Nausea And Vomiting      Medication List    STOP taking these medications   senna 8.6 MG tablet Commonly known as: SENOKOT     TAKE these medications   albuterol 108 (90 Base) MCG/ACT inhaler Commonly known as: VENTOLIN HFA INHALE 2 PUFFS BY MOUTH EVERY 6 HOURS AS NEEDED FOR WHEEZE OR SHORTNESS OF BREATH What changed: See the new instructions.   amLODipine 10 MG tablet Commonly known as: NORVASC Take 1 tablet (10 mg total) by mouth daily. Start taking on: April 12, 2020   Blood Pressure Cuff Misc Use daily as needed to check blood pressure.   ciprofloxacin 500 MG tablet Commonly known as: CIPRO Take 1 tablet (500 mg total) by mouth 2 (two) times daily.   clonazePAM 0.5 MG tablet Commonly known as: KLONOPIN Take 0.5 tablets (0.25 mg total) by mouth  3 (three) times daily as needed for anxiety.   dextromethorphan-guaiFENesin 30-600 MG 12hr tablet Commonly known as: MUCINEX DM Take 2 tablets by mouth 2 (two) times daily.   dronabinol 2.5 MG capsule Commonly known as:  MARINOL TAKE 1 CAPSULE (2.5 MG TOTAL) BY MOUTH 2 (TWO) TIMES DAILY BEFORE A MEAL. What changed:   when to take this  reasons to take this   feeding supplement (PRO-STAT SUGAR FREE 64) Liqd Take 30 mLs by mouth 3 (three) times daily with meals. What changed: when to take this   fentaNYL 75 MCG/HR Commonly known as: [place] 1 patch onto the skin every other day.   gabapentin 300 MG capsule Commonly known as: NEURONTIN TAKE 1 CAPSULE BY MOUTH 3 TIMES A DAY   melatonin 3 MG Tabs tablet Commonly known as: [facility] Melatonin Take 1 tablet (3 mg total) by mouth at bedtime.   methocarbamol 500 MG tablet Commonly known as: ROBAXIN TAKE 1 TABLET BY MOUTH EVERY 6 HOURS AS NEEDED FOR MUSCLE SPASMS What changed: See the new instructions.   multivitamin with minerals Tabs tablet Take 1 tablet by mouth daily.   Myrbetriq 50 MG Tb24 tablet Generic drug: mirabegron ER Take 50 mg by mouth daily.   naloxone 4 MG/0.1ML Liqd nasal spray kit Commonly known as: NARCAN To use if pt develops unconsciousness or confusion that family thinks is related to opioids. What changed:   how to take this  when to take this  additional instructions   ondansetron 8 MG tablet Commonly known as: Zofran Take 1 tablet (8 mg total) by mouth every 8 (eight) hours as needed for nausea, vomiting or refractory nausea / vomiting (max dose in a day is 24 mg total).   oxybutynin 5 MG tablet Commonly known as: DITROPAN Take 5 mg by mouth See admin instructions. Take 5 mg by mouth three times a day and an additional 5 mg once daily as needed for urinary urgency   oxyCODONE-acetaminophen 5-325 MG tablet Commonly known as: Percocet Take 1 tablet by mouth every 3 (three) hours as needed for severe pain.   PARoxetine 40 MG tablet Commonly known as: PAXIL TAKE 1 TABLET BY MOUTH EVERYDAY AT BEDTIME - PA IN PROGRESS What changed: See the new instructions.   polyethylene glycol 17 g packet Commonly known as:  MIRALAX / GLYCOLAX Take 17 g by mouth 2 (two) times daily for 5 days, THEN 17 g daily. Start taking on: April 11, 2020   Probiotic Colon Support Caps Take 1 capsule by mouth daily.   QUEtiapine 50 MG tablet Commonly known as: SEROQUEL Take 50 mg by mouth at bedtime.   senna-docusate 8.6-50 MG tablet Commonly known as: Senokot-S Take 2 tablets by mouth 2 (two) times daily for 5 days, THEN 2 tablets at bedtime. Start taking on: April 11, 2020   Spirometer Kit Use 3-4 times per hour.   Vancomycin HCl 1000 MG/200ML Soln Inject 200 mLs (1,000 mg total) into the vein every 8 (eight) hours.   vancomycin 125 MG capsule Commonly known as: VANCOCIN Take 1 capsule (125 mg total) by mouth 4 (four) times daily.       Follow-up Information    Vivi Barrack, MD. Schedule an appointment as soon as possible for a visit in 1 week(s).   Specialty: Family Medicine Why: Hospital follow-up Contact information: [place] [address] [phone]              Allergies  Allergen Reactions  .  Omnicef [Cefdinir] Nausea And Vomiting    Consultations:  None   Procedures/Studies: CT ABDOMEN PELVIS W CONTRAST  Result Date: 04/08/2020 CLINICAL DATA:  Distension history of flank abscess EXAM: CT ABDOMEN AND PELVIS WITH CONTRAST TECHNIQUE: Multidetector CT imaging of the abdomen and pelvis was performed using the standard protocol following bolus administration of intravenous contrast. CONTRAST:  1109m OMNIPAQUE IOHEXOL 300 MG/ML  SOLN COMPARISON:  CT 01/27/2020, 01/13/2020, 03/07/2019, 05/30/2019, 04/27/2019 FINDINGS: Lower chest: Small chronic slightly thick rimmed left pleural effusion with mild calcification. Stable left anterior pleural thickening. Atelectasis or scarring in the right base. Round atelectasis and or scar in the left lung base without change. Normal cardiac size. Hepatobiliary: No focal liver abnormality is seen. No gallstones, gallbladder wall  thickening, or biliary dilatation. Pancreas: Unremarkable. No pancreatic ductal dilatation or surrounding inflammatory changes. Spleen: Normal in size without focal abnormality. Adrenals/Urinary Tract: Left adrenal gland is normal stable 11 mm right adrenal gland nodule. Kidneys show no hydronephrosis. Subcentimeter hypodense lesions in the left kidney too small to further characterize. Suprapubic catheter in the bladder with small amount of urine in the bladder. Stomach/Bowel: Stomach is nonenlarged. No dilated small bowel. Large amount of stool in the colon. Left lower quadrant colostomy. No acute bowel wall thickening. Rectosigmoid stump is decompressed. Vascular/Lymphatic: Mild aortic atherosclerosis without aneurysm. Vascular coils in the right pelvis. No suspicious adenopathy Reproductive: Prostate is unremarkable. Other: No free air. Chronic presacral soft tissue thickening and stranding. Musculoskeletal: Multiple chronic fracture deformities of the pelvis including bilateral pubic rami fractures. Chronic postsurgical and posttraumatic deformity of the left iliac bone. Surgical hardware in the proximal left femur and within the left acetabulum. Skin thickening and soft tissue infiltration of the left gluteal region and hip without significant change. Large, deep decubitus ulcer on the left, extends to a large bony defect within the left iliac bone. With widening of left SI joint and soft tissue density in the region. Bony destructive changes of the left sacrum and ilium as before. Heterogenous appearance of the right iliac bone associated with ghost track from prior screw fixation, likely related to prior history of osteomyelitis in the region. IMPRESSION: 1. No CT evidence for acute intra-abdominal or pelvic abnormality. 2. Extensive posttraumatic and postsurgical deformities of the left hemipelvis. Large, deep left decubitus ulcer which extends to a large chronic bony defect in the left iliac bone and  involves the SI joint. Bony destructive changes of left sacrum and iliac bone, likely sequela of osteomyelitis, though appearance not significantly changed when compared to most recent priors. Chronic skin thickening and soft tissue infiltration of the left gluteal region and hip. No discrete soft tissue abscess by CT. 3. Status post left lower quadrant colostomy. Negative for bowel obstruction or bowel wall thickening. 4. Small chronic left pleural effusion with probable round atelectasis and scarring in the left base. Aortic Atherosclerosis (ICD10-I70.0). Electronically Signed   By: KDonavan FoilM.D.   On: 04/08/2020 23:51   DG ABD ACUTE 2+V W 1V CHEST  Result Date: 04/09/2020 CLINICAL DATA:  Abdominal distension. EXAM: DG ABDOMEN ACUTE WITH 1 VIEW CHEST COMPARISON:  04/08/2020 CT abdomen pelvis. FINDINGS: Left upper extremity PICC tip overlies the cavoatrial junction. Hypoinflated lungs with left basilar atelectasis. No pneumothorax or pleural effusion. Cardiomediastinal silhouette within normal limits. Moderate to large stool burden. Nonobstructive bowel gas pattern. No pneumoperitoneum. Left lower quadrant ostomy. Postsurgical appearance of the left hemipelvis and proximal left femur. Right hemi pelvic coil embolization material. IMPRESSION: Nonobstructive bowel gas  pattern.  Large stool burden. Left basilar atelectasis. Electronically Signed   By: Primitivo Gauze M.D.   On: 04/09/2020 12:29      Subjective: No abdominal pain today. No issues.  Discharge Exam: Vitals:   04/10/20 1929 04/11/20 0414  BP: (!) 162/105 (!) 163/107  Pulse: 98 97  Resp: 18 18  Temp: 98 F (36.7 C) 97.9 F (36.6 C)  SpO2: 95% 96%   Vitals:   04/10/20 1438 04/10/20 1929 04/11/20 0414 04/11/20 0729  BP: (!) 168/110 (!) 162/105 (!) 163/107   Pulse: 97 98 97   Resp:  18 18   Temp: 97.8 F (36.6 C) 98 F (36.7 C) 97.9 F (36.6 C)   TempSrc: Oral Oral Oral   SpO2:  95% 96%   Weight:    77.1 kg     General: Pt is alert, awake, not in acute distress Cardiovascular: RRR, S1/S2 +, no rubs, no gallops Respiratory: CTA bilaterally, no wheezing, no rhonchi Abdominal: Soft, NT, slightly distended, bowel sounds +. Ostomy bag with brown stool and gas Extremities: no edema, no cyanosis    The results of significant diagnostics from this hospitalization (including imaging, microbiology, ancillary and laboratory) are listed below for reference.     Microbiology: Recent Results (from the past 240 hour(s))  Blood culture (routine x 2)     Status: None (Preliminary result)   Collection Time: 04/08/20  8:00 PM   Specimen: BLOOD  Result Value Ref Range Status   Specimen Description BLOOD SITE NOT SPECIFIED  Final   Special Requests   Final    BOTTLES DRAWN AEROBIC AND ANAEROBIC Blood Culture adequate volume   Culture   Final    NO GROWTH 2 DAYS Performed at [facility], [address] [place], [place] [address]    Report Status PENDING  Incomplete  Urine culture     Status: Abnormal   Collection Time: 04/08/20  8:07 PM   Specimen: Urine, Random  Result Value Ref Range Status   Specimen Description URINE, RANDOM  Final   Special Requests   Final    NONE Performed at [facility], [place] [address] [place], [place] [address]    Culture >=100,000 COLONIES/mL YEAST (A)  Final   Report Status 04/11/2020 FINAL  Final  Blood culture (routine x 2)     Status: None (Preliminary result)   Collection Time: 04/08/20  9:35 PM   Specimen: BLOOD  Result Value Ref Range Status   Specimen Description BLOOD SITE NOT SPECIFIED  Final   Special Requests   Final    BOTTLES DRAWN AEROBIC AND ANAEROBIC Blood Culture adequate volume   Culture   Final    NO GROWTH 2 DAYS Performed at [facility], [place] [address], [place], [place] [address]    Report Status PENDING  Incomplete  Respiratory Panel by RT PCR (Flu A&B, Covid) - Nasopharyngeal Swab     Status: None   Collection Time:  04/08/20 11:34 PM   Specimen: Nasopharyngeal Swab  Result Value Ref Range Status   SARS Coronavirus 2 by RT PCR NEGATIVE NEGATIVE Final    Comment: (NOTE) SARS-CoV-2 target nucleic acids are NOT DETECTED.  The SARS-CoV-2 RNA is generally detectable in upper respiratoy specimens during the acute phase of infection. The lowest concentration of SARS-CoV-2 viral copies this assay can detect is 131 copies/mL. A negative result does not preclude SARS-Cov-2 infection and should not be used as the sole basis for treatment  or other patient management decisions. A negative result may occur with  improper specimen collection/handling, submission of specimen other than nasopharyngeal swab, presence of viral mutation(s) within the areas targeted by this assay, and inadequate number of viral copies (<131 copies/mL). A negative result must be combined with clinical observations, patient history, and epidemiological information. The expected result is Negative.  Fact Sheet for Patients:  PinkCheek.be  Fact Sheet for Healthcare Providers:  GravelBags.it  This test is no t yet approved or cleared by the [place] FDA and  has been authorized for detection and/or diagnosis of SARS-CoV-2 by FDA under an Emergency Use Authorization (EUA). This EUA will remain  in effect (meaning this test can be used) for the duration of the COVID-19 declaration under Section 564(b)(1) of the Act, 21 U.S.C. section 360bbb-3(b)(1), unless the authorization is terminated or revoked sooner.     Influenza A by PCR NEGATIVE NEGATIVE Final   Influenza B by PCR NEGATIVE NEGATIVE Final    Comment: (NOTE) The Xpert Xpress SARS-CoV-2/FLU/RSV assay is intended as an aid in  the diagnosis of influenza from Nasopharyngeal swab specimens and  should not be used as a sole basis for treatment. Nasal washings and  aspirates are unacceptable for Xpert Xpress  SARS-CoV-2/FLU/RSV  testing.  Fact Sheet for Patients: PinkCheek.be  Fact Sheet for Healthcare Providers: GravelBags.it  This test is not yet approved or cleared by the [place] FDA and  has been authorized for detection and/or diagnosis of SARS-CoV-2 by  FDA under an Emergency Use Authorization (EUA). This EUA will remain  in effect (meaning this test can be used) for the duration of the  Covid-19 declaration under Section 564(b)(1) of the Act, 21  U.S.C. section 360bbb-3(b)(1), unless the authorization is  terminated or revoked. Performed at [facility], [place] [address] [place], [place] [address]      Labs: BNP (last 3 results) No results for input(s): BNP in the last 8760 hours. Basic Metabolic Panel: Recent Labs  Lab 04/08/20 1914 04/09/20 0421  NA 138 138  K 3.9 3.7  CL 103 105  CO2 25 23  GLUCOSE 111* 95  BUN 14 14  CREATININE 0.57* 0.63  CALCIUM 9.7 9.7   Liver Function Tests: Recent Labs  Lab 04/08/20 1914  AST 31  ALT 102*  ALKPHOS 224*  BILITOT 0.7  PROT 7.2  ALBUMIN 3.4*   No results for input(s): LIPASE, AMYLASE in the last 168 hours. No results for input(s): AMMONIA in the last 168 hours. CBC: Recent Labs  Lab 04/08/20 1914 04/09/20 0421  WBC 8.5 7.0  NEUTROABS 6.7  --   HGB 13.2 13.3  HCT 42.5 42.5  MCV 100.2* 100.7*  PLT 280 293   Cardiac Enzymes: No results for input(s): CKTOTAL, CKMB, CKMBINDEX, TROPONINI in the last 168 hours. BNP: Invalid input(s): POCBNP CBG: No results for input(s): GLUCAP in the last 168 hours. D-Dimer No results for input(s): DDIMER in the last 72 hours. Hgb A1c No results for input(s): HGBA1C in the last 72 hours. Lipid Profile No results for input(s): CHOL, HDL, LDLCALC, TRIG, CHOLHDL, LDLDIRECT in the last 72 hours. Thyroid function studies No results for input(s): TSH, T4TOTAL, T3FREE, THYROIDAB in the last 72 hours.  Invalid  input(s): FREET3 Anemia work up No results for input(s): VITAMINB12, FOLATE, FERRITIN, TIBC, IRON, RETICCTPCT in the last 72 hours. Urinalysis    Component Value Date/Time   COLORURINE YELLOW 04/08/2020 1925   APPEARANCEUR CLOUDY (A) 04/08/2020 1925  LABSPEC 1.020 04/08/2020 1925   PHURINE 5.0 04/08/2020 1925   GLUCOSEU NEGATIVE 04/08/2020 1925   HGBUR MODERATE (A) 04/08/2020 1925   BILIRUBINUR NEGATIVE 04/08/2020 1925   KETONESUR NEGATIVE 04/08/2020 1925   PROTEINUR NEGATIVE 04/08/2020 1925   NITRITE NEGATIVE 04/08/2020 1925   LEUKOCYTESUR LARGE (A) 04/08/2020 1925   Sepsis Labs Invalid input(s): PROCALCITONIN,  WBC,  LACTICIDVEN Microbiology Recent Results (from the past 240 hour(s))  Blood culture (routine x 2)     Status: None (Preliminary result)   Collection Time: 04/08/20  8:00 PM   Specimen: BLOOD  Result Value Ref Range Status   Specimen Description BLOOD SITE NOT SPECIFIED  Final   Special Requests   Final    BOTTLES DRAWN AEROBIC AND ANAEROBIC Blood Culture adequate volume   Culture   Final    NO GROWTH 2 DAYS Performed at [facility], [place] [address], [place], [place] [address]    Report Status PENDING  Incomplete  Urine culture     Status: Abnormal   Collection Time: 04/08/20  8:07 PM   Specimen: Urine, Random  Result Value Ref Range Status   Specimen Description URINE, RANDOM  Final   Special Requests   Final    NONE Performed at [facility], [place] [address], [place], [place] [address]    Culture >=100,000 COLONIES/mL YEAST (A)  Final   Report Status 04/11/2020 FINAL  Final  Blood culture (routine x 2)     Status: None (Preliminary result)   Collection Time: 04/08/20  9:35 PM   Specimen: BLOOD  Result Value Ref Range Status   Specimen Description BLOOD SITE NOT SPECIFIED  Final   Special Requests   Final    BOTTLES DRAWN AEROBIC AND ANAEROBIC Blood Culture adequate volume   Culture   Final    NO GROWTH 2 DAYS Performed at [facility], [place] [address] [place], [place] [address]    Report Status PENDING  Incomplete  Respiratory Panel by RT PCR (Flu A&B, Covid) - Nasopharyngeal Swab     Status: None   Collection Time: 04/08/20 11:34 PM   Specimen: Nasopharyngeal Swab  Result Value Ref Range Status   SARS Coronavirus 2 by RT PCR NEGATIVE NEGATIVE Final    Comment: (NOTE) SARS-CoV-2 target nucleic acids are NOT DETECTED.  The SARS-CoV-2 RNA is generally detectable in upper respiratoy specimens during the acute phase of infection. The lowest concentration of SARS-CoV-2 viral copies this assay can detect is 131 copies/mL. A negative result does not preclude SARS-Cov-2 infection and should not be used as the sole basis for treatment or other patient management decisions. A negative result may occur with  improper specimen collection/handling, submission of specimen other than nasopharyngeal swab, presence of viral mutation(s) within the areas targeted by this assay, and inadequate number of viral copies (<131 copies/mL). A negative result must be combined with clinical observations, patient history, and epidemiological information. The expected result is Negative.  Fact Sheet for Patients:  PinkCheek.be  Fact Sheet for Healthcare Providers:  GravelBags.it  This test is no t yet approved or cleared by the [place] FDA and  has been authorized for detection and/or diagnosis of SARS-CoV-2 by FDA under an Emergency Use Authorization (EUA). This EUA will remain  in effect (meaning this test can be used) for the duration of the COVID-19 declaration under Section 564(b)(1) of the Act, 21 U.S.C. section 360bbb-3(b)(1), unless the authorization is terminated or revoked sooner.  Influenza A by PCR NEGATIVE NEGATIVE Final   Influenza B by PCR NEGATIVE NEGATIVE Final    Comment: (NOTE) The Xpert Xpress SARS-CoV-2/FLU/RSV assay is intended as an aid in    the diagnosis of influenza from Nasopharyngeal swab specimens and  should not be used as a sole basis for treatment. Nasal washings and  aspirates are unacceptable for Xpert Xpress SARS-CoV-2/FLU/RSV  testing.  Fact Sheet for Patients: PinkCheek.be  Fact Sheet for Healthcare Providers: GravelBags.it  This test is not yet approved or cleared by the [place] FDA and  has been authorized for detection and/or diagnosis of SARS-CoV-2 by  FDA under an Emergency Use Authorization (EUA). This EUA will remain  in effect (meaning this test can be used) for the duration of the  Covid-19 declaration under Section 564(b)(1) of the Act, 21  U.S.C. section 360bbb-3(b)(1), unless the authorization is  terminated or revoked. Performed at [facility], [place] [address], [place], [place] [address]      Time coordinating discharge: 35 minutes  SIGNED:   Cordelia Poche, MD [facility] 04/11/2020, 11:37 AM

## 2020-04-13 ENCOUNTER — Ambulatory Visit: Payer: Self-pay | Admitting: Infectious Disease

## 2020-04-13 ENCOUNTER — Telehealth: Payer: Self-pay

## 2020-04-13 LAB — CULTURE, BLOOD (ROUTINE X 2)
Culture: NO GROWTH
Culture: NO GROWTH
Special Requests: ADEQUATE
Special Requests: ADEQUATE

## 2020-04-13 NOTE — Telephone Encounter (Cosign Needed)
Transition Care Management Follow-up Telephone Call  Date of discharge and from where: 04/11/20 from [facility]  How have you been since you were released from the hospital? Feeling better  Any questions or concerns? No  Items Reviewed:  Did the pt receive and understand the discharge instructions provided? Yes   Medications obtained and verified? Yes   Other? No   Any new allergies since your discharge? No   Dietary orders reviewed? Yes  Do you have support at home? Yes  wife [facility] and Equipment/Supplies: Were home health services ordered? not applicable If so, what is the name of the agency?   Has the agency set up a time to come to the patient's home? not applicable Were any new equipment or medical supplies ordered?  No What is the name of the medical supply agency?  Were you able to get the supplies/equipment? not applicable Do you have any questions related to the use of the equipment or supplies? No  Functional Questionnaire: (I = Independent and D = Dependent) ADLs: D  Bathing/Dressing- D  Meal Prep- D  Eating- D  Maintaining continence- D  Transferring/Ambulation- D  Managing Meds- D  Follow up appointments reviewed:   PCP Hospital f/u appt confirmed? No  Will schedule at a later date  Specialist Hospital f/u appt confirmed? Yes  Scheduled to see Rehab provider on 04/17/20.  Are transportation arrangements needed? No   If their condition worsens, is the pt aware to call PCP or go to the Emergency Dept.? Yes  Was the patient provided with contact information for the PCP's office or ED? Yes  Was to pt encouraged to call back with questions or concerns? Yes

## 2020-04-17 ENCOUNTER — Encounter: Payer: Self-pay | Admitting: Physical Medicine and Rehabilitation

## 2020-04-17 ENCOUNTER — Encounter
Payer: No Typology Code available for payment source | Attending: Physical Medicine and Rehabilitation | Admitting: Physical Medicine and Rehabilitation

## 2020-04-17 ENCOUNTER — Other Ambulatory Visit: Payer: Self-pay

## 2020-04-17 VITALS — BP 130/85 | HR 90 | Temp 98.2°F

## 2020-04-17 DIAGNOSIS — M86152 Other acute osteomyelitis, left femur: Secondary | ICD-10-CM

## 2020-04-17 DIAGNOSIS — Z5181 Encounter for therapeutic drug level monitoring: Secondary | ICD-10-CM

## 2020-04-17 DIAGNOSIS — Z79891 Long term (current) use of opiate analgesic: Secondary | ICD-10-CM

## 2020-04-17 DIAGNOSIS — Z933 Colostomy status: Secondary | ICD-10-CM | POA: Diagnosis present

## 2020-04-17 DIAGNOSIS — G894 Chronic pain syndrome: Secondary | ICD-10-CM | POA: Diagnosis not present

## 2020-04-17 DIAGNOSIS — K5903 Drug induced constipation: Secondary | ICD-10-CM

## 2020-04-17 DIAGNOSIS — M869 Osteomyelitis, unspecified: Secondary | ICD-10-CM | POA: Diagnosis present

## 2020-04-17 NOTE — Patient Instructions (Signed)
Pt is a 55 yr old male with hx of Multitrauma- causing L femoral neck fx, degloving of L hip to going/scrotum, bladder neck trauma- got SPC, developed compartment syndrome -s/p surgery for that; also diverting colostomy, skin grafts, and s/p trachand PEG- PEG is out. 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 B/L tennis elbow new-and B/L pending/forming ulnar neuropathy Osteomyelitis of L hip found again-on IV vanc, PO vanc and Cipro   1. Continue Miralax BID and senokot BID. Esp due to previous constipation.   2. Supposed to be getting check for Zenaida Niece- and then can get van.   3. Needs to put part on w/c and in van for w/c lockdown.   4. con't robaxin for now- could use flexeril in future.   5. Stay off Marinol since eating well.   6.  F/U in 6 weeks. Double appointment .

## 2020-04-17 NOTE — Progress Notes (Signed)
Subjective:    Patient ID: Christopher [name], male    DOB: [birth year], 54 y.o.   MRN: [id number]  HPI    Pt is a 54 yr old male with hx of Multitrauma- causing L femoral neck fx, degloving of L hip to going/scrotum, bladder neck trauma- got SPC, developed compartment syndrome -s/p surgery for that; also diverting colostomy, skin grafts, and s/p trachand PEG- PEG is out. 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 B/L tennis elbow new-and B/L pending/forming ulnar neuropathy Osteomyelitis of L hip found again-on IV vanc, PO vanc and Cipro  Already got swab.   Back was hurting when came in- hurting really bad.  Spent 3 days in hospital since I last saw him.   Said his pain was increased due to constipation- increase senna to BID and Miralax to BID.   Thinks toradol helped him calm down and rest.  Finally started eating after toradol.   Doesn't appear to have CHF which they sent home paperwork on this when left hospital.   Things has been better, since home- eating like crazy- and sitting up in bed/chair most of day.   Trying to get up more now.  When feels like does now, wants to get up and move around.   Gets tremors when getting ready to go to sleep.  Just starting to doze off- will jerk.  Explained that it is likely normal.   Refusing marinol because eating well Con't robaxin since helps pain and calms him down.     Pain Inventory Average Pain 9 Pain Right Now 9 My pain is sharp, burning, dull, stabbing, tingling and aching  LOCATION OF PAIN  hip  BOWEL Number of stools per week: colostomy Oral laxative use Yes  Type of laxative Senna, Miralax Enema or suppository use No  History of colostomy No  Incontinent No    BLADDER Suprapubic In and out cath, frequency na Able to self cath No  Bladder incontinence No  Frequent urination No  Leakage with coughing No  Difficulty starting stream No  Incomplete bladder emptying No    Mobility walk with assistance use a cane use a walker how many minutes can you walk? 5 ability to climb steps?  no do you drive?  no use a wheelchair needs help with transfers  Function disabled: date disabled . I need assistance with the following:  dressing, bathing, toileting, meal prep, household duties and shopping  Neuro/Psych bowel control problems weakness numbness tremor tingling trouble walking spasms confusion depression anxiety loss of taste or smell  Prior Studies x-rays CT/MRI  Physicians involved in your care Any changes since last visit?  no   Family History  Problem Relation Age of Onset   Breast cancer Mother        with mets to the bones   Social History   Socioeconomic History   Marital status: Married    Spouse name: Not on file   Number of children: Not on file   Years of education: Not on file   Highest education level: Not on file  Occupational History   Not on file  Tobacco Use   Smoking status: Former Smoker    Packs/day: 1.00    Years: 20.00    Pack years: 20.00  Types: Cigarettes    Quit date: 07/10/2018    Years since quitting: 1.7   Smokeless tobacco: Never Used  Vaping Use   Vaping Use: Never used  Substance and Sexual Activity   Alcohol use: Never   Drug use: Yes    Types: Oxycodone, Fentanyl    Comment: Fentanyl patch/oxycodone since 06/2018   Sexual activity: Yes  Other Topics Concern   Not on file  Social History Narrative   ** Merged History Encounter **       Social Determinants of Health   Financial Resource Strain:    Difficulty of Paying Living Expenses: Not on file  Food Insecurity:    Worried About Programme researcher, broadcasting/film/video in the Last Year: Not on file   [employer] of  Food in the Last Year: Not on file  Transportation Needs:    Lack of Transportation (Medical): Not on file   Lack of Transportation (Non-Medical): Not on file  Physical Activity:    Days of Exercise per Week: Not on file   Minutes of Exercise per Session: Not on file  Stress:    Feeling of Stress : Not on file  Social Connections:    Frequency of Communication with Friends and Family: Not on file   Frequency of Social Gatherings with Friends and Family: Not on file   Attends Religious Services: Not on file   Active Member of Clubs or Organizations: Not on file   Attends Banker Meetings: Not on file   Marital Status: Not on file   Past Surgical History:  Procedure Laterality Date   APPLICATION OF A-CELL OF BACK N/A 08/06/2018   Procedure: Application Of A-Cell Of Back;  Surgeon: Peggye Form, DO;  Location: [facility] OR;  Service: Plastics;  Laterality: N/A;   APPLICATION OF A-CELL OF EXTREMITY Left 08/06/2018   Procedure: Application Of A-Cell Of Extremity;  Surgeon: Peggye Form, DO;  Location: [facility];  Service: Plastics;  Laterality: Left;   APPLICATION OF A-CELL OF EXTREMITY Left 09/18/2019   Procedure: APPLICATION OF A-CELL OF EXTREMITY;  Surgeon: Peggye Form, DO;  Location: [facility];  Service: Plastics;  Laterality: Left;   APPLICATION OF WOUND VAC  07/12/2018   Procedure: Application Of Wound Vac to the Left Thigh and Scrotum.;  Surgeon: Roby Lofts, MD;  Location: [facility] OR;  Service: Orthopedics;;   APPLICATION OF WOUND VAC  07/10/2018   Procedure: Application Of Wound Vac;  Surgeon: Berna Bue, MD;  Location: [facility] OR;  Service: General;;   COLON SURGERY  2020   colostomy   COLOSTOMY N/A 07/23/2018   Procedure: COLOSTOMY;  Surgeon: Violeta Gelinas, MD;  Location: [facility] OR;  Service: General;  Laterality: N/A;   CYSTOSCOPY W/ URETERAL STENT PLACEMENT N/A 07/15/2018   Procedure: RETROGRADE URETHROGRAM;  Surgeon: Marcine Matar, MD;   Location: [facility] OR;  Service: Urology;  Laterality: N/A;   CYSTOSCOPY WITH LITHOLAPAXY N/A 05/06/2019   Procedure: CYSTOSCOPY BASKET BLADDER STONE EXTRACTION;  Surgeon: Malen Gauze, MD;  Location: [facility];  Service: Urology;  Laterality: N/A;  30 MINS   CYSTOSTOMY N/A 05/06/2019   Procedure: REPLACEMENT OF SUPRAPUBIC CATHETER;  Surgeon: Malen Gauze, MD;  Location: [facility];  Service: Urology;  Laterality: N/A;   DEBRIDEMENT AND CLOSURE WOUND Left 03/04/2019   Procedure: Excision of hip wound with placement of Acell;  Surgeon: Peggye Form, DO;  Location: [facility];  Service: Plastics;  Laterality: Left;  ESOPHAGOGASTRODUODENOSCOPY N/A 08/14/2018   Procedure: ESOPHAGOGASTRODUODENOSCOPY (EGD);  Surgeon: Violeta Gelinas, MD;  Location: [facility] ENDOSCOPY;  Service: General;  Laterality: N/A;  bedside   FACIAL RECONSTRUCTION SURGERY     X 2--once as a teenager and second time in his 1's   HARDWARE REMOVAL Left 03/04/2019   Procedure: Left Hip Hardware Removal;  Surgeon: Roby Lofts, MD;  Location: [facility];  Service: Orthopedics;  Laterality: Left;   HIP PINNING,CANNULATED Left 07/12/2018   Procedure: CANNULATED HIP PINNING;  Surgeon: Roby Lofts, MD;  Location: [facility];  Service: Orthopedics;  Laterality: Left;   HIP SURGERY     HOLMIUM LASER APPLICATION Right 07/15/2019   Procedure: HOLMIUM LASER APPLICATION;  Surgeon: Malen Gauze, MD;  Location: [facility];  Service: Urology;  Laterality: Right;   I & D EXTREMITY Left 07/25/2018   Procedure: Debridement of buttock, scrotum and left leg, placement of acell and vac;  Surgeon: Peggye Form, DO;  [facility]: [facility];  Service: Plastics;  Laterality: Left;   I & D EXTREMITY N/A 08/06/2018   Procedure: Debridement of buttock, scrotum and left leg;  Surgeon: Peggye Form, DO;  [facility]: [facility];  Service: Plastics;  Laterality: N/A;   I & D EXTREMITY N/A 08/13/2018    Procedure: Debridement of buttock, scrotum and left leg, placement of acell and vac;  Surgeon: Peggye Form, DO;  Location: [facility];  Service: Plastics;  Laterality: N/A;  90 min, please   INCISION AND DRAINAGE HIP Left 09/18/2019   Procedure: IRRIGATION AND DEBRIDEMENT HIP/ PELVIS WITH WOUND VAC PLACEMENT;  Surgeon: Roby Lofts, MD;  Location: [facility];  Service: Orthopedics;  Laterality: Left;   INCISION AND DRAINAGE OF WOUND N/A 07/18/2018   Procedure: Debridement of left leg, buttocks and scrotal wound with placement of acell and Flexiseal;  Surgeon: Peggye Form, DO;  Location: [facility];  Service: Plastics;  Laterality: N/A;   INCISION AND DRAINAGE OF WOUND Left 08/29/2018   Procedure: Debridement of buttock, scrotum and left leg, placement of acell and vac;  Surgeon: Peggye Form, DO;  Location: [facility];  Service: Plastics;  Laterality: Left;  75 min, please   INCISION AND DRAINAGE OF WOUND Bilateral 10/23/2018   Procedure: DEBRIDEMENT OF BUTTOCK,SCROTUM, AND LEG WOUNDS WITH PLACEMENT OF ACELL- BILATERAL 90 MIN;  Surgeon: Peggye Form, DO;  Location: [facility];  Service: Plastics;  Laterality: Bilateral;   IR ANGIOGRAM PELVIS SELECTIVE OR SUPRASELECTIVE  07/10/2018   IR ANGIOGRAM PELVIS SELECTIVE OR SUPRASELECTIVE  07/10/2018   IR ANGIOGRAM SELECTIVE EACH ADDITIONAL VESSEL  07/10/2018   IR EMBO ART  VEN HEMORR LYMPH EXTRAV  INC GUIDE ROADMAPPING  07/10/2018   IR NEPHROSTOMY PLACEMENT LEFT  04/05/2019   IR NEPHROSTOMY PLACEMENT RIGHT  05/31/2019   IR US GUIDE BX ASP/DRAIN  07/10/2018   IR US GUIDE VASC ACCESS RIGHT  07/10/2018   IR VENO/EXT/UNI LEFT  07/10/2018   IRRIGATION AND DEBRIDEMENT OF WOUND WITH SPLIT THICKNESS SKIN GRAFT Left 09/19/2018   Procedure: Debridement of gluteal wound with placement of acell to left leg.;  Surgeon: Peggye Form, DO;  Location: [facility];  Service: Plastics;  Laterality: Left;  2.5 hours, please   LAPAROTOMY N/A 07/12/2018    Procedure: EXPLORATORY LAPAROTOMY;  Surgeon: Violeta Gelinas, MD;  Location: [facility] OR;  Service: General;  Laterality: N/A;   LAPAROTOMY N/A 07/15/2018   Procedure: WOUND EXPLORATION; CLOSURE OF ABDOMEN;  Surgeon: Violeta Gelinas, MD;  [facility];  Service: General;  Laterality: N/A;   LAPAROTOMY  07/10/2018   Procedure: Exploratory Laparotomy;  Surgeon: Berna Bue, MD;  Location: [facility] OR;  Service: General;;   MASS EXCISION Left 09/18/2019   Procedure: EXCISION UPPER LEFT INNER THIGH WOUND;  Surgeon: Peggye Form, DO;  [facility];  Service: Plastics;  Laterality: Left;   NEPHROLITHOTOMY Right 07/15/2019   Procedure: NEPHROLITHOTOMY PERCUTANEOUS;  Surgeon: Malen Gauze, MD;  Location: [facility];  Service: Urology;  Laterality: Right;  90 MINS   PEG PLACEMENT N/A 08/14/2018   Procedure: PERCUTANEOUS ENDOSCOPIC GASTROSTOMY (PEG) PLACEMENT;  Surgeon: Violeta Gelinas, MD;  Location: [facility] ENDOSCOPY;  Service: General;  Laterality: N/A;   PERCUTANEOUS TRACHEOSTOMY N/A 08/02/2018   Procedure: PERCUTANEOUS TRACHEOSTOMY;  Surgeon: Violeta Gelinas, MD;  Location: [facility] OR;  Service: General;  Laterality: N/A;   RADIOLOGY WITH ANESTHESIA N/A 07/10/2018   Procedure: IR WITH ANESTHESIA;  Surgeon: Simonne Come, MD;  Location: [facility] OR;  Service: Radiology;  Laterality: N/A;   RADIOLOGY WITH ANESTHESIA Right 07/10/2018   Procedure: Ir With Anesthesia;  Surgeon: Simonne Come, MD;  Location: [facility] OR;  Service: Radiology;  Laterality: Right;   SCROTAL EXPLORATION N/A 07/15/2018   Procedure: SCROTUM DEBRIDEMENT;  Surgeon: Marcine Matar, MD;  Location: [facility] OR;  Service: Urology;  Laterality: N/A;   SHOULDER SURGERY     SKIN SPLIT GRAFT Right 09/19/2018   Procedure: Skin Graft Split Thickness;  Surgeon: Peggye Form, DO;  Location: [facility];  Service: Plastics;  Laterality: Right;   SKIN SPLIT GRAFT N/A 10/03/2018   Procedure: Split thickness skin graft to gluteal area with  acell placement;  Surgeon: Peggye Form, DO;  Location: [facility];  Service: Plastics;  Laterality: N/A;  3 hours, please   VACUUM ASSISTED CLOSURE CHANGE N/A 07/12/2018   Procedure: ABDOMINAL VACUUM ASSISTED CLOSURE CHANGE and abdominal washout;  Surgeon: Violeta Gelinas, MD;  Location: [facility] OR;  Service: General;  Laterality: N/A;   WOUND DEBRIDEMENT Left 07/23/2018   Procedure: DEBRIDEMENT LEFT BUTTOCK  WOUND;  Surgeon: Violeta Gelinas, MD;  Location: [facility] OR;  Service: General;  Laterality: Left;   WOUND EXPLORATION Left 07/10/2018   Procedure: WOUND EXPLORATION LEFT GROIN;  Surgeon: Berna Bue, MD;  Location: [facility] OR;  Service: General;  Laterality: Left;   Past Medical History:  Diagnosis Date   Acute on chronic respiratory failure with hypoxia (HCC) 06/2018   trach removed 11-16-2018, on vent from jan until may 2020 - uses albuterol prn   Anxiety    Bacteremia due to Pseudomonas 06/2018   Chronic osteomyelitis (HCC)    Chronic pain syndrome    Clostridium difficile colitis 10/30/2019   tx with abx    Depression    DVT (deep venous thrombosis) (HCC) 2020   right brachial post PICC line   History of blood transfusion 06/2018   History of kidney stones    Hypertension    norvasc d/c by pcp on 11/05/19   Multiple traumatic injuries    Penile pain 11/18/2019   Pneumonia 11/2009   2020 x 2   Walker as ambulation aid    Wheelchair bound    electric   Wound discharge    left hip wound with bloody/clear drainage change dressing q day surgilube with gauze, between legs wound using calcium algenate pad bid   BP 130/85    Pulse 90    Temp 98.2 F (36.8 C)    SpO2 93%  Opioid Risk Score:   Fall Risk Score:  `1  Depression screen PHQ 2/9  Dep[facility] screen [facility] 03/17/2020 12/30/2019 12/18/2019 04/10/2019  Decreased Interest 0 0 0 0  Down, Depressed, Hopeless 1 0 0 0  PHQ - 2 Score 1 0 0 0  Some recent data might be hidden    Review of Systems   Musculoskeletal: Positive for gait problem.  Neurological: Positive for tremors, weakness and numbness.  Psychiatric/Behavioral: Positive for confusion and dysphoric mood. The patient is nervous/anxious.   All other systems reviewed and are negative.      Objective:   Physical Exam   Awake, alert, appropriate, more interactive, doing most of talking now, which is new, accompanied by wife, NAD In power w/c, controller on R L posterior hip- being packed- wound- draining onto dressing- drainage of yellowish/brown on dressing- dressing intact- changed last yesterday night.  Less swelling of posterior L hip-  Is actually much flatter, however sticks out more laterally to L.  Much less TTP over swelling area     Assessment & Plan:    Pt is a 54 yr old male with hx of Multitrauma- causing L femoral neck fx, degloving of L hip to going/scrotum, bladder neck trauma- got SPC, developed compartment syndrome -s/p surgery for that; also diverting colostomy, skin grafts, and s/p trachand PEG- PEG is out. 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 B/L tennis elbow new-and B/L pending/forming ulnar neuropathy Osteomyelitis of L hip found again-on IV vanc, PO vanc and Cipro   1. Continue Miralax BID and senokot BID. Esp due to previous constipation.   2. Supposed to be getting check for Zenaida Niecevan- and then can get van.   3. Needs to put part on w/c and in van for w/c lockdown.   4. con't robaxin for now- could use flexeril in future.   5. Stay off Marinol since eating well.   6.  F/U in 6 weeks. Double appointment .   I spent a total of 25 minutes as detailed above.

## 2020-04-20 ENCOUNTER — Ambulatory Visit (INDEPENDENT_AMBULATORY_CARE_PROVIDER_SITE_OTHER): Payer: No Typology Code available for payment source | Admitting: Family

## 2020-04-20 ENCOUNTER — Encounter: Payer: Self-pay | Admitting: Family

## 2020-04-20 ENCOUNTER — Other Ambulatory Visit: Payer: Self-pay

## 2020-04-20 DIAGNOSIS — K5903 Drug induced constipation: Secondary | ICD-10-CM | POA: Diagnosis not present

## 2020-04-20 DIAGNOSIS — M86652 Other chronic osteomyelitis, left thigh: Secondary | ICD-10-CM | POA: Diagnosis not present

## 2020-04-20 DIAGNOSIS — T402X5A Adverse effect of other opioids, initial encounter: Secondary | ICD-10-CM | POA: Diagnosis not present

## 2020-04-20 DIAGNOSIS — Z95828 Presence of other vascular implants and grafts: Secondary | ICD-10-CM | POA: Diagnosis not present

## 2020-04-20 DIAGNOSIS — Z9189 Other specified personal risk factors, not elsewhere classified: Secondary | ICD-10-CM

## 2020-04-20 MED ORDER — VANCOMYCIN HCL 125 MG PO CAPS: 125.0000 mg | ORAL_CAPSULE | Freq: Four times a day (QID) | ORAL | 11 refills | Status: DC

## 2020-04-20 MED ORDER — CIPROFLOXACIN HCL 500 MG PO TABS: 500.0000 mg | ORAL_TABLET | Freq: Two times a day (BID) | ORAL | 11 refills | Status: DC

## 2020-04-20 MED ORDER — NALOXEGOL OXALATE 25 MG PO TABS: 25.0000 mg | ORAL_TABLET | Freq: Every day | ORAL | 1 refills | Status: DC

## 2020-04-20 NOTE — Progress Notes (Signed)
Subjective:    Patient ID: Christopher [name], male    DOB: 12-Nov-1965, 54 y.o.   MRN: [id number]  Chief Complaint  Patient presents with  . Follow-up    bone infection pelvis      HPI:  Christopher [name] is a 54 y.o. 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  Awaiting referral/evaluation by [facility].  He was continued on his antibiotic regimen of IV vancomycin, oral vancomycin, and oral ciprofloxacin.  Previous culture results with E. Coli and multidrug resistant Pseudomonas.  Here today for routine follow-up.  [name] was seen in the ED since his last office visit for UTI.  Feeling well today.  Wife has concerns for constipation since increasing his fentanyl patch dose.  Nutritional intake continues to wax and wane.  No recent systemic symptoms including fevers and chills.  Pain continues to wax and wane on a daily basis.  Continues to have difficulties with drawing back blood from his PICC line.  Dressing was changed today.  No redness or swelling.  Allergies  Allergen Reactions  . Omnicef [Cefdinir] Nausea And Vomiting      Outpatient Medications Prior to Visit  Medication Sig Dispense Refill  . albuterol (VENTOLIN HFA) 108 (90 Base) MCG/ACT inhaler INHALE 2 PUFFS BY MOUTH EVERY 6 HOURS AS NEEDED FOR WHEEZE OR SHORTNESS OF BREATH (Patient taking differently: Inhale 2 puffs into the lungs every 6 (six) hours as needed for wheezing or shortness of breath. ) 18 each 1  . Amino Acids-Protein Hydrolys (FEEDING SUPPLEMENT, PRO-STAT SUGAR FREE 64,) LIQD Take 30 mLs by mouth 3 (three) times daily with meals. (Patient taking differently: Take 30 mLs by mouth 2 (two) times daily. ) 887 mL 0  . amLODipine (NORVASC) 10 MG tablet Take 1 tablet (10 mg total) by mouth daily. 30 tablet 0  . Blood Pressure Monitoring  (BLOOD PRESSURE CUFF) MISC Use daily as needed to check blood pressure. 1 each 0  . clonazePAM (KLONOPIN) 0.5 MG tablet Take 0.5 tablets (0.25 mg total) by mouth 3 (three) times daily as needed for anxiety. 50 tablet 3  . dextromethorphan-guaiFENesin (MUCINEX DM) 30-600 MG 12hr tablet Take 2 tablets by mouth 2 (two) times daily.    Marland Kitchen dronabinol (MARINOL) 2.5 MG capsule TAKE 1 CAPSULE (2.5 MG TOTAL) BY MOUTH 2 (TWO) TIMES DAILY BEFORE A MEAL. (Patient taking differently: Take 2.5 mg by mouth 2 (two) times daily as needed (to encourage appetite). ) 60 capsule 5  . fentaNYL (DURAGESIC) 75 MCG/HR Place 1 patch onto the skin every other day. 15 patch 0  . gabapentin (NEURONTIN) 300 MG capsule TAKE 1 CAPSULE BY MOUTH 3 TIMES A DAY (Patient taking differently: Take 300 mg by mouth 3 (three) times daily. ) 90 capsule 6  . melatonin ([employer] MELATONIN) 3 MG TABS tablet Take 1 tablet (3 mg total) by mouth at bedtime. 10 tablet 0  . methocarbamol (ROBAXIN) 500 MG tablet TAKE 1 TABLET BY MOUTH EVERY 6 HOURS AS NEEDED FOR MUSCLE SPASMS (Patient taking differently: Take 500 mg by mouth 4 (four) times daily. ) 100 tablet 11  . mirabegron ER (MYRBETRIQ) 50 MG TB24 tablet Take 50 mg by mouth daily.    . Multiple Vitamin (MULTIVITAMIN WITH MINERALS) TABS tablet Take 1 tablet by mouth daily.    . naloxone (NARCAN) nasal spray 4 mg/0.1  mL To use if pt develops unconsciousness or confusion that family thinks is related to opioids. (Patient taking differently: Place into the nose See admin instructions. "To use if pt develops unconsciousness or confusion that family thinks is related to opioids") 1 each 3  . ondansetron (ZOFRAN) 8 MG tablet Take 1 tablet (8 mg total) by mouth every 8 (eight) hours as needed for nausea, vomiting or refractory nausea / vomiting (max dose in a day is 24 mg total). 120 tablet 5  . oxybutynin (DITROPAN) 5 MG tablet Take 5 mg by mouth See admin instructions. Take 5 mg by mouth three times a day and an  additional 5 mg once daily as needed for urinary urgency    . oxyCODONE-acetaminophen (PERCOCET) 5-325 MG tablet Take 1 tablet by mouth every 3 (three) hours as needed for severe pain. 180 tablet 0  . PARoxetine (PAXIL) 40 MG tablet TAKE 1 TABLET BY MOUTH EVERYDAY AT BEDTIME - PA IN PROGRESS (Patient taking differently: Take 40 mg by mouth at bedtime. ) 90 tablet 2  . polyethylene glycol (MIRALAX / GLYCOLAX) 17 g packet Take 17 g by mouth 2 (two) times daily for 5 days, THEN 17 g daily. 40 packet 0  . Probiotic Product (PROBIOTIC COLON SUPPORT) CAPS Take 1 capsule by mouth daily.    . QUEtiapine (SEROQUEL) 50 MG tablet Take 50 mg by mouth at bedtime.     Marland Kitchen Respiratory Therapy Supplies (SPIROMETER) KIT Use 3-4 times per hour. 1 kit 0  . senna-docusate (SENOKOT-S) 8.6-50 MG tablet Take 2 tablets by mouth 2 (two) times daily for 5 days, THEN 2 tablets at bedtime. 80 tablet 0  . ciprofloxacin (CIPRO) 500 MG tablet Take 1 tablet (500 mg total) by mouth 2 (two) times daily. 60 tablet 1  . vancomycin (VANCOCIN) 125 MG capsule Take 1 capsule (125 mg total) by mouth 4 (four) times daily. 120 capsule 1  . ELIQUIS 5 MG TABS tablet Take 5 mg by mouth 2 (two) times daily.     No facility-administered medications prior to visit.     Past Medical History:  Diagnosis Date  . Acute on chronic respiratory failure with hypoxia (HCC) 06/2018   trach removed 11-16-2018, on vent from jan until may 2020 - uses albuterol prn  . Anxiety   . Bacteremia due to Pseudomonas 06/2018  . Chronic osteomyelitis (HCC)   . Chronic pain syndrome   . Clostridium difficile colitis 10/30/2019   tx with abx   . Depression   . DVT (deep venous thrombosis) (HCC) 2020   right brachial post PICC line  . History of blood transfusion 06/2018  . History of kidney stones   . Hypertension    norvasc d/c by pcp on 11/05/19  . Multiple traumatic injuries   . Penile pain 11/18/2019  . Pneumonia 11/2009   2020 x 2  . Dan Humphreys as ambulation  aid   . Wheelchair bound    electric  . Wound discharge    left hip wound with bloody/clear drainage change dressing q day surgilube with gauze, between legs wound using calcium algenate pad bid     Past Surgical History:  Procedure Laterality Date  . APPLICATION OF A-CELL OF BACK N/A 08/06/2018   Procedure: Application Of A-Cell Of Back;  Surgeon: Peggye Form, DO;  [facility];  Service: Plastics;  Laterality: N/A;  . APPLICATION OF A-CELL OF EXTREMITY Left 08/06/2018   Procedure: Application Of A-Cell Of Extremity;  Surgeon: Ulice Bold,  Alena Bills, DO;  Location: [facility] OR;  Service: Plastics;  Laterality: Left;  . APPLICATION OF A-CELL OF EXTREMITY Left 09/18/2019   Procedure: APPLICATION OF A-CELL OF EXTREMITY;  Surgeon: Peggye Form, DO;  [facility];  Service: Plastics;  Laterality: Left;  . APPLICATION OF WOUND VAC  07/12/2018   Procedure: Application Of Wound Vac to the Left Thigh and Scrotum.;  Surgeon: Roby Lofts, MD;  [facility];  Service: Orthopedics;;  . APPLICATION OF WOUND VAC  07/10/2018   Procedure: Application Of Wound Vac;  Surgeon: Berna Bue, MD;  [facility];  Service: General;;  . COLON SURGERY  2020   colostomy  . COLOSTOMY N/A 07/23/2018   Procedure: COLOSTOMY;  Surgeon: Violeta Gelinas, MD;  Location: [facility] OR;  Service: General;  Laterality: N/A;  . CYSTOSCOPY W/ URETERAL STENT PLACEMENT N/A 07/15/2018   Procedure: RETROGRADE URETHROGRAM;  Surgeon: Marcine Matar, MD;  Location: [facility] OR;  Service: Urology;  Laterality: N/A;  . CYSTOSCOPY WITH LITHOLAPAXY N/A 05/06/2019   Procedure: CYSTOSCOPY BASKET BLADDER STONE EXTRACTION;  Surgeon: Malen Gauze, MD;  Location: [facility];  Service: Urology;  Laterality: N/A;  30 MINS  . CYSTOSTOMY N/A 05/06/2019   Procedure: REPLACEMENT OF SUPRAPUBIC CATHETER;  Surgeon: Malen Gauze, MD;  Location: [facility];  Service: Urology;  Laterality: N/A;  .  DEBRIDEMENT AND CLOSURE WOUND Left 03/04/2019   Procedure: Excision of hip wound with placement of Acell;  Surgeon: Peggye Form, DO;  [facility];  Service: Plastics;  Laterality: Left;  . ESOPHAGOGASTRODUODENOSCOPY N/A 08/14/2018   Procedure: ESOPHAGOGASTRODUODENOSCOPY (EGD);  Surgeon: Violeta Gelinas, MD;  Location: [facility] ENDOSCOPY;  Service: General;  Laterality: N/A;  bedside  . FACIAL RECONSTRUCTION SURGERY     X 2--once as a teenager and second time in his 43's  . HARDWARE REMOVAL Left 03/04/2019   Procedure: Left Hip Hardware Removal;  Surgeon: Roby Lofts, MD;  [facility];  Service: Orthopedics;  Laterality: Left;  . HIP PINNING,CANNULATED Left 07/12/2018   Procedure: CANNULATED HIP PINNING;  Surgeon: Roby Lofts, MD;  [facility];  Service: Orthopedics;  Laterality: Left;  . HIP SURGERY    . HOLMIUM LASER APPLICATION Right 07/15/2019   Procedure: HOLMIUM LASER APPLICATION;  Surgeon: Malen Gauze, MD;  Location: [facility];  Service: Urology;  Laterality: Right;  . I & D EXTREMITY Left 07/25/2018   Procedure: Debridement of buttock, scrotum and left leg, placement of acell and vac;  Surgeon: Peggye Form, DO;  [facility];  Service: Plastics;  Laterality: Left;  . I & D EXTREMITY N/A 08/06/2018   Procedure: Debridement of buttock, scrotum and left leg;  Surgeon: Peggye Form, DO;  [facility];  Service: Plastics;  Laterality: N/A;  . I & D EXTREMITY N/A 08/13/2018   Procedure: Debridement of buttock, scrotum and left leg, placement of acell and vac;  Surgeon: Peggye Form, DO;  [facility];  Service: Plastics;  Laterality: N/A;  90 min, please  . INCISION AND DRAINAGE HIP Left 09/18/2019   Procedure: IRRIGATION AND DEBRIDEMENT HIP/ PELVIS WITH WOUND VAC PLACEMENT;  Surgeon: Roby Lofts, MD;  [facility];  Service: Orthopedics;  Laterality: Left;  . INCISION AND DRAINAGE OF WOUND N/A 07/18/2018   Procedure:  Debridement of left leg, buttocks and scrotal wound with placement of acell and Flexiseal;  Surgeon: Peggye Form, DO;  [facility];  Service: Plastics;  Laterality: N/A;  . INCISION AND DRAINAGE OF WOUND Left 08/29/2018   Procedure: Debridement of buttock, scrotum and left leg, placement of acell and vac;  Surgeon: Peggye Form, DO;  [facility];  Service: Plastics;  Laterality: Left;  75 min, please  . INCISION AND DRAINAGE OF WOUND Bilateral 10/23/2018   Procedure: DEBRIDEMENT OF BUTTOCK,SCROTUM, AND LEG WOUNDS WITH PLACEMENT OF ACELL- BILATERAL 90 MIN;  Surgeon: Peggye Form, DO;  [facility];  Service: Plastics;  Laterality: Bilateral;  . IR ANGIOGRAM PELVIS SELECTIVE OR SUPRASELECTIVE  07/10/2018  . IR ANGIOGRAM PELVIS SELECTIVE OR SUPRASELECTIVE  07/10/2018  . IR ANGIOGRAM SELECTIVE EACH ADDITIONAL VESSEL  07/10/2018  . IR EMBO ART  VEN HEMORR LYMPH EXTRAV  INC GUIDE ROADMAPPING  07/10/2018  . IR NEPHROSTOMY PLACEMENT LEFT  04/05/2019  . IR NEPHROSTOMY PLACEMENT RIGHT  05/31/2019  . IR US GUIDE BX ASP/DRAIN  07/10/2018  . IR US GUIDE VASC ACCESS RIGHT  07/10/2018  . IR VENO/EXT/UNI LEFT  07/10/2018  . IRRIGATION AND DEBRIDEMENT OF WOUND WITH SPLIT THICKNESS SKIN GRAFT Left 09/19/2018   Procedure: Debridement of gluteal wound with placement of acell to left leg.;  Surgeon: Peggye Form, DO;  [facility];  Service: Plastics;  Laterality: Left;  2.5 hours, please  . LAPAROTOMY N/A 07/12/2018   Procedure: EXPLORATORY LAPAROTOMY;  Surgeon: Violeta Gelinas, MD;  Location: [facility] OR;  Service: General;  Laterality: N/A;  . LAPAROTOMY N/A 07/15/2018   Procedure: WOUND EXPLORATION; CLOSURE OF ABDOMEN;  Surgeon: Violeta Gelinas, MD;  Location: [facility] OR;  Service: General;  Laterality: N/A;  . LAPAROTOMY  07/10/2018   Procedure: Exploratory Laparotomy;  Surgeon: Berna Bue, MD;  Location: [facility] OR;  Service: General;;  . MASS EXCISION Left 09/18/2019   Procedure:  EXCISION UPPER LEFT INNER THIGH WOUND;  Surgeon: Peggye Form, DO;  [facility];  Service: Plastics;  Laterality: Left;  . NEPHROLITHOTOMY Right 07/15/2019   Procedure: NEPHROLITHOTOMY PERCUTANEOUS;  Surgeon: Malen Gauze, MD;  Location: [facility];  Service: Urology;  Laterality: Right;  90 MINS  . PEG PLACEMENT N/A 08/14/2018   Procedure: PERCUTANEOUS ENDOSCOPIC GASTROSTOMY (PEG) PLACEMENT;  Surgeon: Violeta Gelinas, MD;  Location: [facility] ENDOSCOPY;  Service: General;  Laterality: N/A;  . PERCUTANEOUS TRACHEOSTOMY N/A 08/02/2018   Procedure: PERCUTANEOUS TRACHEOSTOMY;  Surgeon: Violeta Gelinas, MD;  Location: [facility] OR;  Service: General;  Laterality: N/A;  . RADIOLOGY WITH ANESTHESIA N/A 07/10/2018   Procedure: IR WITH ANESTHESIA;  Surgeon: Simonne Come, MD;  Location: [facility] OR;  Service: Radiology;  Laterality: N/A;  . RADIOLOGY WITH ANESTHESIA Right 07/10/2018   Procedure: Ir With Anesthesia;  Surgeon: Simonne Come, MD;  Location: [facility] OR;  Service: Radiology;  Laterality: Right;  . SCROTAL EXPLORATION N/A 07/15/2018   Procedure: SCROTUM DEBRIDEMENT;  Surgeon: Marcine Matar, MD;  Location: [facility] OR;  Service: Urology;  Laterality: N/A;  . SHOULDER SURGERY    . SKIN SPLIT GRAFT Right 09/19/2018   Procedure: Skin Graft Split Thickness;  Surgeon: Peggye Form, DO;  [facility];  Service: Plastics;  Laterality: Right;  . SKIN SPLIT GRAFT N/A 10/03/2018   Procedure: Split thickness skin graft to gluteal area with acell placement;  Surgeon: Peggye Form, DO;  [facility];  Service: Plastics;  Laterality: N/A;  3 hours, please  . VACUUM ASSISTED CLOSURE CHANGE N/A 07/12/2018   Procedure: ABDOMINAL VACUUM ASSISTED CLOSURE CHANGE and abdominal washout;  Surgeon: Georganna Skeans, MD;  Location: [place];  Service: General;  Laterality: N/A;  . WOUND DEBRIDEMENT Left 07/23/2018   Procedure: DEBRIDEMENT LEFT BUTTOCK  WOUND;  Surgeon: Georganna Skeans, MD;  Location: [place];   Service: General;  Laterality: Left;  . WOUND EXPLORATION Left 07/10/2018   Procedure: WOUND EXPLORATION LEFT GROIN;  Surgeon: Clovis Riley, MD;  Location: [place];  Service: General;  Laterality: Left;       Review of Systems  Constitutional: Negative for chills, diaphoresis, fatigue and fever.  Respiratory: Negative for cough, chest tightness, shortness of breath and wheezing.   Cardiovascular: Negative for chest pain.  Gastrointestinal: Negative for abdominal pain, diarrhea, nausea and vomiting.      Objective:    BP 121/83   Pulse 91   Temp 97.6 F (36.4 C) (Oral)   Resp 16   Ht $R'6\' 3"'sA$  (1.905 m)   Wt 169 lb (76.7 kg)   SpO2 94%   BMI 21.12 kg/m  Nursing note and vital signs reviewed.  Physical Exam Constitutional:      General: He is not in acute distress.    Appearance: He is well-developed.  Cardiovascular:     Rate and Rhythm: Normal rate and regular rhythm.     Heart sounds: Normal heart sounds.  Pulmonary:     Effort: Pulmonary effort is normal.     Breath sounds: Normal breath sounds.  Skin:    General: Skin is warm and dry.  Neurological:     Mental Status: He is alert and oriented to person, place, and time.  Psychiatric:        Mood and Affect: Mood normal.      Depression screen [facility] 2/9 03/17/2020 12/30/2019 12/18/2019 04/10/2019  Decreased Interest 0 0 0 0  Down, Depressed, Hopeless 1 0 0 0  PHQ - 2 Score 1 0 0 0  Some recent data might be hidden       Assessment & Plan:    Patient Active Problem List   Diagnosis Date Noted  . Hypertensive urgency 04/11/2020  . Therapeutic opioid induced constipation 04/11/2020  . Abdominal pain 04/09/2020  . Status post peripherally inserted central catheter (PICC) central line placement 03/17/2020  . Skin-picking disorder 03/12/2020  . CHF (congestive heart failure) ([place]) 01/27/2020  . Bone infection of pelvis ([place]) 12/12/2019  . Recurrent pain of right knee 11/08/2019  . Elevated blood pressure  reading 11/05/2019  . Acute osteomyelitis of pelvic region and thigh ([place])   . Acute deep vein thrombosis (DVT) of brachial vein of right upper extremity ([place]) 11/01/2019  . Myofascial pain dysfunction syndrome 08/05/2019  . Protein-calorie malnutrition, severe 06/01/2019  . Chronic osteomyelitis of pelvis, left ([place]) 03/07/2019  . Wound infection, posttraumatic 02/28/2019  . Hydronephrosis concurrent with and due to calculi of kidney and ureter 02/28/2019  . GERD (gastroesophageal reflux disease) 02/28/2019  . Suprapubic catheter ([place]) 02/28/2019  . Protein-calorie malnutrition, moderate ([place]) 02/28/2019  . 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 Reactive depression 01/29/2019  . Chronic pain due to trauma 01/29/2019  . Neuropathic pain 01/29/2019  . Colostomy status ([place]) 01/14/2019  . Insomnia 01/14/2019  . Tachycardia 01/14/2019  . Current use of proton pump inhibitor 01/14/2019  . Wound of buttock 12/28/2018  . Anxiety and depression 12/17/2018  . Debility 11/23/2018  . Chronic pain syndrome   . Multiple traumatic injuries   . Pressure injury of skin 08/13/2018  . Urethral injury 07/13/2018  Problem List Items Addressed This Visit      Digestive   Therapeutic opioid induced constipation    Mr. Lynk appears to have symptoms consistent with opioid-induced constipation exacerbated by a recent increase in pain medication need.  This has been refractory to multiple medications.  We will try Movantik to determine any benefit.  Can also try Relistor in the future if necessary.  Continue to monitor.        Musculoskeletal and Integument   Chronic osteomyelitis of pelvis, left [facility])    Mr. Granberg continues to have waxing and waning pain as would be anticipated given his current situation.  Awaiting evaluation by [facility] to determine any potential surgical interventions.  From an infectious disease standpoint it appears we have at  least suppression as he has remained stable over the last 2 or so months.  No clear need for continued gram-positive coverage as he has no history of MRSA.  Will discontinue his IV vancomycin.  Will need to continue ciprofloxacin and oral IV vancomycin for C. difficile prevention.  Appears primary organisms involved are E. coli and multidrug resistant Pseudomonas.  At minimum would anticipate long-term suppression with ciprofloxacin and will continue to monitor need for gram-positive coverage.  Await evaluation and recommendations by [facility].  Plan for follow-up in 1 month or sooner if needed.      Relevant Medications   vancomycin (VANCOCIN) 125 MG capsule   ciprofloxacin (CIPRO) 500 MG tablet     Other   Status post peripherally inserted central catheter (PICC) central line placement    PICC line removed in clinic without complication.  Home care instructions provided. [facility] notified of changes.        Other Visit Diagnoses    At risk for Clostridium difficile infection       Relevant Medications   vancomycin (VANCOCIN) 125 MG capsule       I have discontinued [name] B. Thom's Eliquis. I am also having him start on naloxegol oxalate. Additionally, I am having him maintain his multivitamin with minerals, feeding supplement (PRO-STAT SUGAR FREE 64), QUEtiapine, dronabinol, gabapentin, melatonin, Spirometer, Blood Pressure Cuff, methocarbamol, mirabegron ER, oxybutynin, naloxone, dextromethorphan-guaiFENesin, Probiotic Colon Support, clonazePAM, albuterol, oxyCODONE-acetaminophen, ondansetron, PARoxetine, fentaNYL, amLODipine, polyethylene glycol, senna-docusate, vancomycin, and ciprofloxacin.   Meds ordered this encounter  Medications  . vancomycin (VANCOCIN) 125 MG capsule    Sig: Take 1 capsule (125 mg total) by mouth 4 (four) times daily.    Dispense:  120 capsule    Refill:  11    Order Specific Question:   Supervising Provider    Answer:   Carlyle Basques [[id number]]  . ciprofloxacin (CIPRO) 500 MG tablet    Sig: Take 1 tablet (500 mg total) by mouth 2 (two) times daily.    Dispense:  60 tablet    Refill:  11    Order Specific Question:   Supervising Provider    Answer:   Carlyle Basques [[id number]]  . naloxegol oxalate (MOVANTIK) 25 MG TABS tablet    Sig: Take 1 tablet (25 mg total) by mouth daily.    Dispense:  30 tablet    Refill:  1    Order Specific Question:   Supervising Provider    Answer:   Carlyle Basques [[id number]]     Follow-up: Return in about 1 month (around 05/20/2020), or if symptoms worsen or fail to improve.   Terri Piedra, MSN, FNP-C Nurse Practitioner [facility] Main number: [phone]

## 2020-04-20 NOTE — Patient Instructions (Addendum)
Nice to see you.  We will continue your ciprofloxacin and oral vancomycin.  We will discontinue the IV vancomycin and PICC line  We will try Relistor for your constipation  Plan for follow-up in 1 month or sooner if needed.  Have a great day and stay safe!j

## 2020-04-20 NOTE — Progress Notes (Signed)
Per verbal order from [facility], 48 cm Single Lumen Peripherally Inserted Central Catheter removed from left basilic, tip intact. No sutures present. RN confirmed length per chart. Dressing was clean and dry. Petroleum dressing applied, patient waited 15 minutes in office under observation for any side effects. Pt advised no heavy lifting with this arm, leave dressing for 24 hours and call the office or seek emergent care if dressing becomes soaked with blood or sharp pain presents. Patient verbalized understanding and agreement.  Patient's questions answered to their satisfaction. Patient tolerated procedure well, NP walked patient out. Andree Coss, RN

## 2020-04-20 NOTE — Assessment & Plan Note (Addendum)
PICC line removed in clinic without complication.  Home care instructions provided. [facility] notified of changes.

## 2020-04-20 NOTE — Assessment & Plan Note (Signed)
Christopher [name] continues to have waxing and waning pain as would be anticipated given his current situation.  Awaiting evaluation by [facility] to determine any potential surgical interventions.  From an infectious disease standpoint it appears we have at least suppression as he has remained stable over the last 2 or so months.  No clear need for continued gram-positive coverage as he has no history of MRSA.  Will discontinue his IV vancomycin.  Will need to continue ciprofloxacin and oral IV vancomycin for C. difficile prevention.  Appears primary organisms involved are E. coli and multidrug resistant Pseudomonas.  At minimum would anticipate long-term suppression with ciprofloxacin and will continue to monitor need for gram-positive coverage.  Await evaluation and recommendations by [facility].  Plan for follow-up in 1 month or sooner if needed.

## 2020-04-20 NOTE — Assessment & Plan Note (Signed)
Mr. Figg appears to have symptoms consistent with opioid-induced constipation exacerbated by a recent increase in pain medication need.  This has been refractory to multiple medications.  We will try Movantik to determine any benefit.  Can also try Relistor in the future if necessary.  Continue to monitor.

## 2020-04-23 LAB — DRUG TOX MONITOR 1 W/CONF, ORAL FLD
Amphetamines: NEGATIVE ng/mL (ref <10)
Barbiturates: NEGATIVE ng/mL (ref <10)
Benzodiazepines: NEGATIVE ng/mL (ref <0.50)
Buprenorphine: NEGATIVE ng/mL (ref <0.10)
Buprenorphine: NEGATIVE ng/mL (ref <0.10)
Cocaine: NEGATIVE ng/mL (ref <5.0)
Codeine: NEGATIVE ng/mL (ref <2.5)
Dihydrocodeine: NEGATIVE ng/mL (ref <2.5)
Fentanyl: 10.58 ng/mL — ABNORMAL HIGH (ref <0.10)
Fentanyl: POSITIVE ng/mL — AB (ref <0.10)
Heroin Metabolite: NEGATIVE ng/mL (ref <1.0)
Hydrocodone: NEGATIVE ng/mL (ref <2.5)
Hydromorphone: NEGATIVE ng/mL (ref <2.5)
MARIJUANA: NEGATIVE ng/mL (ref <2.5)
MDMA: NEGATIVE ng/mL (ref <10)
Meprobamate: NEGATIVE ng/mL (ref <2.5)
Methadone: NEGATIVE ng/mL (ref <5.0)
Morphine: NEGATIVE ng/mL (ref <2.5)
Naloxone: NEGATIVE ng/mL (ref <0.25)
Nicotine Metabolite: NEGATIVE ng/mL (ref <5.0)
Norbuprenorphine: NEGATIVE ng/mL (ref <0.50)
Norhydrocodone: NEGATIVE ng/mL (ref <2.5)
Noroxycodone: 39.8 ng/mL — ABNORMAL HIGH (ref <2.5)
Opiates: POSITIVE ng/mL — AB (ref <2.5)
Oxycodone: 233.5 ng/mL — ABNORMAL HIGH (ref <2.5)
Oxymorphone: NEGATIVE ng/mL (ref <2.5)
Phencyclidine: NEGATIVE ng/mL (ref <10)
Tapentadol: NEGATIVE ng/mL (ref <5.0)
Tramadol: NEGATIVE ng/mL (ref <5.0)
Zolpidem: NEGATIVE ng/mL (ref <5.0)

## 2020-04-23 LAB — DRUG TOX ALC METAB W/CON, ORAL FLD: Alcohol Metabolite: NEGATIVE ng/mL (ref <25)

## 2020-04-24 ENCOUNTER — Telehealth: Payer: Self-pay | Admitting: *Deleted

## 2020-04-24 NOTE — Telephone Encounter (Signed)
Oral swab drug screen was consistent for prescribed medications.  ?

## 2020-04-27 ENCOUNTER — Telehealth: Payer: Self-pay

## 2020-04-27 ENCOUNTER — Telehealth: Payer: Self-pay | Admitting: *Deleted

## 2020-04-27 MED ORDER — NALOXEGOL OXALATE 12.5 MG PO TABS: 12.5000 mg | ORAL_TABLET | Freq: Every day | ORAL | 1 refills | Status: DC

## 2020-04-27 NOTE — Addendum Note (Signed)
Addended by: Valarie Cones on: 04/27/2020 11:16 AM   Modules accepted: Orders

## 2020-04-27 NOTE — Telephone Encounter (Signed)
Please have them decrease Movantik to 12.5 mg. Thanks.

## 2020-04-27 NOTE — Telephone Encounter (Addendum)
Previous Rx: Movantik 25 mg tablet dose change to 12.5mg   per Marcos Eke, NP (verbal order). New phoned in to pharmacy with dose changes.  Valarie Cones

## 2020-04-27 NOTE — Telephone Encounter (Signed)
Received notification comments  from pharmacy  DDI with Ciprofloxacin, increase level of Movantik. Please advise. Routing to Hannah for advise.  Valarie Cones

## 2020-04-27 NOTE — Telephone Encounter (Signed)
Christopher [name] from [facility] called to report that Mr Moch is having increased pain in abd area and N&V.  Please give her a call or his wife.  Christopher [name] [facility] [phone], Mrs Mcdonagh [phone].

## 2020-04-28 MED ORDER — PROMETHAZINE HCL 12.5 MG PO TABS: 12.5000 mg | ORAL_TABLET | Freq: Four times a day (QID) | ORAL | 5 refills | Status: DC | PRN

## 2020-04-28 NOTE — Telephone Encounter (Signed)
Hip pain still- stomach pain better today- has eaten OK today- and not thrown up.   Has Zofran prn for nausea- but no phenergan. Was taken  Off IV ABX- last week- PO Vanc and PO Cipro- thinks that could be causing increased pain.   Will try to add phenergan as needed - 12.5 mg up to 25 mg every 6 hours as needed. Should not interact with pain meds.   Was asking for more anxiety meds yesterday.  Wasn't anxious at all before injury.   Movantik was added by ID. To help with constipation- still is having stools with colostomy. Will think/see if can come up with anything else for anxiety that's not controlled substance.   CBD oil with <3% THC- is legal in [place]. Suggest this for pt for anxiety and pain.  Can take Marinol on days he's nauseated/sick- only then.

## 2020-04-29 ENCOUNTER — Telehealth: Payer: Self-pay

## 2020-04-29 NOTE — Telephone Encounter (Signed)
Received notification from [facility] that the patient's ciprofloxacin is on backorder with them and that [facility] has it in stock. Spoke with the patient's wife, [name], to let her know to call whichever [facility] is most convenient for them to have the patient's prescription transferred. Melanie verbalizes understanding and has no further questions.   Sandie Ano, RN

## 2020-05-03 ENCOUNTER — Other Ambulatory Visit: Payer: Self-pay | Admitting: Family Medicine

## 2020-05-04 NOTE — Telephone Encounter (Signed)
LAST APPOINTMENT DATE: 03/12/2020 NEXT APPOINTMENT DATE: Visit date not found

## 2020-05-05 ENCOUNTER — Other Ambulatory Visit: Payer: Self-pay

## 2020-05-05 ENCOUNTER — Encounter: Payer: Self-pay | Admitting: Surgical

## 2020-05-05 ENCOUNTER — Ambulatory Visit (INDEPENDENT_AMBULATORY_CARE_PROVIDER_SITE_OTHER): Payer: No Typology Code available for payment source | Admitting: Surgical

## 2020-05-05 VITALS — BP 128/86 | HR 78 | Temp 97.8°F

## 2020-05-05 DIAGNOSIS — S31809D Unspecified open wound of unspecified buttock, subsequent encounter: Secondary | ICD-10-CM

## 2020-05-05 DIAGNOSIS — L089 Local infection of the skin and subcutaneous tissue, unspecified: Secondary | ICD-10-CM

## 2020-05-05 DIAGNOSIS — M86652 Other chronic osteomyelitis, left thigh: Secondary | ICD-10-CM

## 2020-05-05 DIAGNOSIS — T07XXXA Unspecified multiple injuries, initial encounter: Secondary | ICD-10-CM

## 2020-05-05 DIAGNOSIS — T148XXA Other injury of unspecified body region, initial encounter: Secondary | ICD-10-CM

## 2020-05-05 NOTE — Progress Notes (Signed)
   Subjective:     Patient ID: Christopher [name], male    DOB: [birth year], 54 y.o.   MRN: [id number]  Chief Complaint  Patient presents with  . Follow-up    HPI: The patient is a 54 y.o. male here for follow-up after crush injury to his pelvis and left lower extremity.  He is here with his wife and social Investment banker, operational.  He is currently on p.o. vancomycin and p.o. ciprofloxacin, PICC line recently DC'd.  Patient was recently hospitalized for abdominal pain, he reports this has improved.  He reports that transportation has been a lot easier now that he has a Merchant navy officer with a chairlift.  They previously saw a Engineer, petroleum at [facility] to discuss possible further surgical management, however they were unable to provide any surgical assistance or recommendations.  Dr. Ulice Bold has been discussing patient's case with [facility] and is hopeful he can be evaluated at their multidisciplinary clinic which includes plastic surgery, Ortho, infectious disease.  We are still waiting to hear back, patient reports that he has not heard anything either.  Christopher [name], patient's wife has been packing the left hip wound daily with iodoform packing gauze.  They have been covering his sacrum and the left hip wound with Mepilex border dressings.  She reports that his groin wound and sacral wound have healed.  He continues to have drainage from the left hip wound.  There is no surrounding cellulitic changes, there is some chronic erythema and scarring noted.  Review of Systems  Constitutional: Negative.   Gastrointestinal: Positive for nausea.  Skin: Positive for wound.     Objective:   Vital Signs BP 128/86 (BP Location: Left Arm, Patient Position: Sitting, Cuff Size: Normal)   Pulse 78   Temp 97.8 F (36.6 C) (Oral)   SpO2 97%   Nursing note and vital signs reviewed Physical Exam Constitutional:      General: He is not in acute distress.    Appearance: Normal appearance. He is not ill-appearing.     Comments:  Wheelchair-bound  Abdominal:       Comments: Ostomy bag noted  Skin:         Comments: Left hip sinus tract noted with packing gauze in place, yellowish drainage noted, no purulence.  No cellulitic changes.  No foul odor is noted.  Area is tender to palpation.  Neurological:     Mental Status: He is alert.       Assessment/Plan:     ICD-10-CM   1. Multiple traumatic injuries  T07.XXXA   2. Wound of buttock, unspecified laterality, subsequent encounter  S31.809D   3. Wound infection, posttraumatic  T14.8XXA    L08.9   4. Chronic osteomyelitis of pelvis, left (HCC)  G81.856     Recommend continuing with packing gauze to the left hip wound, provided patient with a prescription for 1 inch packing gauze.  Case management to assist with obtaining supplies.  Recommend following up in 3 to 4 weeks for reevaluation.  Dr. Ulice Bold is going to reach out to [facility] multidisciplinary clinic to further discuss evaluation/consultation.  I recommend they call us with any questions or concerns.  Patient is currently being managed by infectious disease for the chronic osteomyelitis.   Kermit Balo Corgan Mormile, PA-C 05/05/2020, 12:11 PM

## 2020-05-11 ENCOUNTER — Telehealth: Payer: Self-pay

## 2020-05-11 MED ORDER — OXYCODONE-ACETAMINOPHEN 5-325 MG PO TABS: 1.0000 | ORAL_TABLET | ORAL | 0 refills | Status: DC | PRN

## 2020-05-11 MED ORDER — FENTANYL 75 MCG/HR TD PT72: 1.0000 | MEDICATED_PATCH | TRANSDERMAL | 0 refills | Status: DC

## 2020-05-11 NOTE — Telephone Encounter (Signed)
Medication e-scribed by Dr. Berline Chough

## 2020-05-15 ENCOUNTER — Other Ambulatory Visit: Payer: Self-pay

## 2020-05-15 ENCOUNTER — Encounter (HOSPITAL_COMMUNITY): Payer: Self-pay | Admitting: *Deleted

## 2020-05-15 ENCOUNTER — Emergency Department (HOSPITAL_COMMUNITY)
Admission: EM | Admit: 2020-05-15 | Discharge: 2020-05-15 | Disposition: A | Discharge status: Home / Self Care | Payer: Self-pay | Attending: Emergency Medicine | Admitting: Emergency Medicine

## 2020-05-15 ENCOUNTER — Telehealth: Payer: Self-pay

## 2020-05-15 DIAGNOSIS — Z7901 Long term (current) use of anticoagulants: Secondary | ICD-10-CM | POA: Insufficient documentation

## 2020-05-15 DIAGNOSIS — R000 Tachycardia, unspecified: Secondary | ICD-10-CM | POA: Insufficient documentation

## 2020-05-15 DIAGNOSIS — T83010A Breakdown (mechanical) of cystostomy catheter, initial encounter: Secondary | ICD-10-CM

## 2020-05-15 DIAGNOSIS — Z87891 Personal history of nicotine dependence: Secondary | ICD-10-CM | POA: Insufficient documentation

## 2020-05-15 DIAGNOSIS — I110 Hypertensive heart disease with heart failure: Secondary | ICD-10-CM | POA: Insufficient documentation

## 2020-05-15 DIAGNOSIS — Z79899 Other long term (current) drug therapy: Secondary | ICD-10-CM | POA: Insufficient documentation

## 2020-05-15 DIAGNOSIS — I509 Heart failure, unspecified: Secondary | ICD-10-CM | POA: Insufficient documentation

## 2020-05-15 DIAGNOSIS — T83198A Other mechanical complication of other urinary devices and implants, initial encounter: Secondary | ICD-10-CM | POA: Insufficient documentation

## 2020-05-15 MED ORDER — OXYCODONE-ACETAMINOPHEN 5-325 MG PO TABS
1.0000 | ORAL_TABLET | Freq: Once | ORAL | Status: AC
  Administered 2020-05-15: 1 via ORAL
  Filled 2020-05-15: qty 1

## 2020-05-15 NOTE — Discharge Instructions (Addendum)
Please follow up with urology and return to the emergency department for any new or worsening symptoms.

## 2020-05-15 NOTE — ED Triage Notes (Signed)
Patient states his suprapubic cath was replaced Thurs am   By home health nurse states he woke up at 2 am with severe abd. Pain and realized his cath had come out. His wife attempted several times to replace however couldn't get it back in.

## 2020-05-15 NOTE — Telephone Encounter (Signed)
FYI.Marland KitchenMarland KitchenLaretta Bolster, Nurse Case Manager, [facility], called to let us know that patient has an appointment with Ortho and Infectious Disease at [facility] on 05/26/20.  She wanted to be sure we were aware since Dr. Ulice Bold has been coordinating the effort.

## 2020-05-15 NOTE — ED Notes (Signed)
Called [facility] for transport home.  

## 2020-05-15 NOTE — ED Provider Notes (Addendum)
[facility] EMERGENCY DEPARTMENT Provider Note   CSN: [id number] Arrival date & time: 05/15/20  0434     History Chief Complaint  Patient presents with  . Abdominal Pain    Christopher [name] is a 54 y.o. male.  HPI   Pt is a 54 y/o male who presents to the ED today for eval of suprapubic catheter problem. States he was sleeping when his suprapubic catheter came out. States his wife tried to replace it but was not able to get the catheter back in. He has had the catheter for 2 years.   He states that the balloon came out and had a hole in it.   Past Medical History:  Diagnosis Date  . Acute on chronic respiratory failure with hypoxia ([place]) 06/2018   trach removed 11-16-2018, on vent from jan until may 2020 - uses albuterol prn  . Anxiety   . Bacteremia due to Pseudomonas 06/2018  . Chronic osteomyelitis ([place])   . Chronic pain syndrome   . Clostridium difficile colitis 10/30/2019   tx with abx   . Depression   . DVT (deep venous thrombosis) ([place]) 2020   right brachial post PICC line  . History of blood transfusion 06/2018  . History of kidney stones   . Hypertension    norvasc d/c by pcp on 11/05/19  . Multiple traumatic injuries   . Penile pain 11/18/2019  . Pneumonia 11/2009   2020 x 2  . Gilford Rile as ambulation aid   . Wheelchair bound    electric  . Wound discharge    left hip wound with bloody/clear drainage change dressing q day surgilube with gauze, between legs wound using calcium algenate pad bid    Patient Active Problem List   Diagnosis Date Noted  . Hypertensive urgency 04/11/2020  . Therapeutic opioid induced constipation 04/11/2020  . Abdominal pain 04/09/2020  . Status post peripherally inserted central catheter (PICC) central line placement 03/17/2020  . Skin-picking disorder 03/12/2020  . CHF (congestive heart failure) ([place]) 01/27/2020  . Bone infection of pelvis ([place]) 12/12/2019  . Recurrent pain of right knee 11/08/2019  .  Elevated blood pressure reading 11/05/2019  . Acute osteomyelitis of pelvic region and thigh ([place])   . Acute deep vein thrombosis (DVT) of brachial vein of right upper extremity ([place]) 11/01/2019  . Myofascial pain dysfunction syndrome 08/05/2019  . Protein-calorie malnutrition, severe 06/01/2019  . Chronic osteomyelitis of pelvis, left ([place]) 03/07/2019  . Wound infection, posttraumatic 02/28/2019  . Hydronephrosis concurrent with and due to calculi of kidney and ureter 02/28/2019  . GERD (gastroesophageal reflux disease) 02/28/2019  . Suprapubic catheter ([place]) 02/28/2019  . Protein-calorie malnutrition, moderate ([place]) 02/28/2019  . 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 Reactive depression 01/29/2019  . Chronic pain due to trauma 01/29/2019  . Neuropathic pain 01/29/2019  . Colostomy status ([place]) 01/14/2019  . Insomnia 01/14/2019  . Tachycardia 01/14/2019  . Current use of proton pump inhibitor 01/14/2019  . Wound of buttock 12/28/2018  . Anxiety and depression 12/17/2018  . Debility 11/23/2018  . Chronic pain syndrome   . Multiple traumatic injuries   . Pressure injury of skin 08/13/2018  . Urethral injury 07/13/2018    Past Surgical History:  Procedure Laterality Date  . APPLICATION OF A-CELL OF BACK N/A 08/06/2018   Procedure: Application Of A-Cell Of Back;  Surgeon: [name], DO;  Location: [place];  Service: Plastics;  Laterality: N/A;  . APPLICATION  OF A-CELL OF EXTREMITY Left 08/06/2018   Procedure: Application Of A-Cell Of Extremity;  Surgeon: [name], DO;  Location: [place];  Service: Plastics;  Laterality: Left;  . APPLICATION OF A-CELL OF EXTREMITY Left 09/18/2019   Procedure: APPLICATION OF A-CELL OF EXTREMITY;  Surgeon: [name], DO;  Location: [place];  Service: Plastics;  Laterality: Left;  . APPLICATION OF WOUND VAC  07/12/2018   Procedure: Application Of Wound Vac to the Left Thigh and Scrotum.;  Surgeon:  Shona Needles, MD;  Location: [employer];  Service: Orthopedics;;  . APPLICATION OF WOUND VAC  07/10/2018   Procedure: Application Of Wound Vac;  Surgeon: Clovis Riley, MD;  Location: [place];  Service: General;;  . COLON SURGERY  2020   colostomy  . COLOSTOMY N/A 07/23/2018   Procedure: COLOSTOMY;  Surgeon: Georganna Skeans, MD;  Location: [place];  Service: General;  Laterality: N/A;  . CYSTOSCOPY W/ URETERAL STENT PLACEMENT N/A 07/15/2018   Procedure: RETROGRADE URETHROGRAM;  Surgeon: Franchot Gallo, MD;  Location: [place];  Service: Urology;  Laterality: N/A;  . CYSTOSCOPY WITH LITHOLAPAXY N/A 05/06/2019   Procedure: CYSTOSCOPY BASKET BLADDER STONE EXTRACTION;  Surgeon: Cleon Gustin, MD;  Location: [facility];  Service: Urology;  Laterality: N/A;  30 MINS  . CYSTOSTOMY N/A 05/06/2019   Procedure: REPLACEMENT OF SUPRAPUBIC CATHETER;  Surgeon: Cleon Gustin, MD;  Location: [facility];  Service: Urology;  Laterality: N/A;  . DEBRIDEMENT AND CLOSURE WOUND Left 03/04/2019   Procedure: Excision of hip wound with placement of A[place];  Surgeon: [name], DO;  Location: [place];  Service: Plastics;  Laterality: Left;  . ESOPHAGOGASTRODUODENOSCOPY N/A 08/14/2018   Procedure: ESOPHAGOGASTRODUODENOSCOPY (EGD);  Surgeon: Georganna Skeans, MD;  Location: [place];  Service: General;  Laterality: N/A;  bedside  . FACIAL RECONSTRUCTION SURGERY     X 2--once as a teenager and second time in his 87's  . HARDWARE REMOVAL Left 03/04/2019   Procedure: Left Hip Hardware Removal;  Surgeon: Shona Needles, MD;  Location: [place];  Service: Orthopedics;  Laterality: Left;  . HIP PINNING,CANNULATED Left 07/12/2018   Procedure: CANNULATED HIP PINNING;  Surgeon: Shona Needles, MD;  Location: [place];  Service: Orthopedics;  Laterality: Left;  . HIP SURGERY    . HOLMIUM LASER APPLICATION Right 12/20/238   Procedure: HOLMIUM LASER APPLICATION;  Surgeon: Cleon Gustin, MD;  Location: [facility];  Service: Urology;  Laterality: Right;  . I & D EXTREMITY Left 07/25/2018   Procedure: Debridement of buttock, scrotum and left leg, placement of acell and vac;  Surgeon: [name], DO;  Location: [place];  Service: Plastics;  Laterality: Left;  . I & D EXTREMITY N/A 08/06/2018   Procedure: Debridement of buttock, scrotum and left leg;  Surgeon: [name], DO;  Location: [place];  Service: Plastics;  Laterality: N/A;  . I & D EXTREMITY N/A 08/13/2018   Procedure: Debridement of buttock, scrotum and left leg, placement of acell and vac;  Surgeon: [name], DO;  Location: [place];  Service: Plastics;  Laterality: N/A;  90 min, please  . INCISION AND DRAINAGE HIP Left 09/18/2019   Procedure: IRRIGATION AND DEBRIDEMENT HIP/ PELVIS WITH WOUND VAC PLACEMENT;  Surgeon: Shona Needles, MD;  Location: [place];  Service: Orthopedics;  Laterality: Left;  . INCISION AND DRAINAGE OF WOUND N/A 07/18/2018   Procedure: Debridement of left leg, buttocks  and scrotal wound with placement of acell and Flexiseal;  Surgeon: [name], DO;  Location: [place];  Service: Plastics;  Laterality: N/A;  . INCISION AND DRAINAGE OF WOUND Left 08/29/2018   Procedure: Debridement of buttock, scrotum and left leg, placement of acell and vac;  Surgeon: [name], DO;  Location: [place];  Service: Plastics;  Laterality: Left;  75 min, please  . INCISION AND DRAINAGE OF WOUND Bilateral 10/23/2018   Procedure: DEBRIDEMENT OF BUTTOCK,SCROTUM, AND LEG WOUNDS WITH PLACEMENT OF ACELL- BILATERAL 90 MIN;  Surgeon: [name], DO;  Location: [place];  Service: Plastics;  Laterality: Bilateral;  . IR ANGIOGRAM PELVIS SELECTIVE OR SUPRASELECTIVE  07/10/2018  . IR ANGIOGRAM PELVIS SELECTIVE OR SUPRASELECTIVE  07/10/2018  . IR ANGIOGRAM SELECTIVE EACH ADDITIONAL VESSEL  07/10/2018  . IR EMBO ART  VEN HEMORR LYMPH EXTRAV  INC GUIDE ROADMAPPING  07/10/2018  . IR  NEPHROSTOMY PLACEMENT LEFT  04/05/2019  . IR NEPHROSTOMY PLACEMENT RIGHT  05/31/2019  . IR US GUIDE BX ASP/DRAIN  07/10/2018  . IR US GUIDE VASC ACCESS RIGHT  07/10/2018  . IR VENO/EXT/UNI LEFT  07/10/2018  . IRRIGATION AND DEBRIDEMENT OF WOUND WITH SPLIT THICKNESS SKIN GRAFT Left 09/19/2018   Procedure: Debridement of gluteal wound with placement of acell to left leg.;  Surgeon: [name], DO;  Location: [place];  Service: Plastics;  Laterality: Left;  2.5 hours, please  . LAPAROTOMY N/A 07/12/2018   Procedure: EXPLORATORY LAPAROTOMY;  Surgeon: Georganna Skeans, MD;  Location: [place];  Service: General;  Laterality: N/A;  . LAPAROTOMY N/A 07/15/2018   Procedure: WOUND EXPLORATION; CLOSURE OF ABDOMEN;  Surgeon: Georganna Skeans, MD;  Location: [place];  Service: General;  Laterality: N/A;  . LAPAROTOMY  07/10/2018   Procedure: Exploratory Laparotomy;  Surgeon: Clovis Riley, MD;  Location: [place];  Service: General;;  . MASS EXCISION Left 09/18/2019   Procedure: EXCISION UPPER LEFT INNER THIGH WOUND;  Surgeon: [name], DO;  Location: [place];  Service: Plastics;  Laterality: Left;  . NEPHROLITHOTOMY Right 07/15/2019   Procedure: NEPHROLITHOTOMY PERCUTANEOUS;  Surgeon: Cleon Gustin, MD;  Location: [facility];  Service: Urology;  Laterality: Right;  90 MINS  . PEG PLACEMENT N/A 08/14/2018   Procedure: PERCUTANEOUS ENDOSCOPIC GASTROSTOMY (PEG) PLACEMENT;  Surgeon: Georganna Skeans, MD;  Location: [place];  Service: General;  Laterality: N/A;  . PERCUTANEOUS TRACHEOSTOMY N/A 08/02/2018   Procedure: PERCUTANEOUS TRACHEOSTOMY;  Surgeon: Georganna Skeans, MD;  Location: [place];  Service: General;  Laterality: N/A;  . RADIOLOGY WITH ANESTHESIA N/A 07/10/2018   Procedure: IR WITH ANESTHESIA;  Surgeon: Sandi Mariscal, MD;  Location: [place];  Service: Radiology;  Laterality: N/A;  . RADIOLOGY WITH ANESTHESIA Right 07/10/2018   Procedure: Ir With Anesthesia;  Surgeon: Sandi Mariscal,  MD;  Location: [place];  Service: Radiology;  Laterality: Right;  . SCROTAL EXPLORATION N/A 07/15/2018   Procedure: SCROTUM DEBRIDEMENT;  Surgeon: Franchot Gallo, MD;  Location: [place];  Service: Urology;  Laterality: N/A;  . SHOULDER SURGERY    . SKIN SPLIT GRAFT Right 09/19/2018   Procedure: Skin Graft Split Thickness;  Surgeon: [name], DO;  Location: [place];  Service: Plastics;  Laterality: Right;  . SKIN SPLIT GRAFT N/A 10/03/2018   Procedure: Split thickness skin graft to gluteal area with acell placement;  Surgeon: [name], DO;  Location: [place];  Service: Plastics;  Laterality: N/A;  3 hours, please  .  VACUUM ASSISTED CLOSURE CHANGE N/A 07/12/2018   Procedure: ABDOMINAL VACUUM ASSISTED CLOSURE CHANGE and abdominal washout;  Surgeon: Georganna Skeans, MD;  Location: [place];  Service: General;  Laterality: N/A;  . WOUND DEBRIDEMENT Left 07/23/2018   Procedure: DEBRIDEMENT LEFT BUTTOCK  WOUND;  Surgeon: Georganna Skeans, MD;  Location: [place];  Service: General;  Laterality: Left;  . WOUND EXPLORATION Left 07/10/2018   Procedure: WOUND EXPLORATION LEFT GROIN;  Surgeon: Clovis Riley, MD;  [facility];  Service: General;  Laterality: Left;       Family History  Problem Relation Age of Onset  . Breast cancer Mother        with mets to the bones    Social History   Tobacco Use  . Smoking status: Former Smoker    Packs/day: 1.00    Years: 20.00    Pack years: 20.00    Types: Cigarettes    Quit date: 07/10/2018    Years since quitting: 1.8  . Smokeless tobacco: Never Used  Vaping Use  . Vaping Use: Never used  Substance Use Topics  . Alcohol use: Never  . Drug use: Yes    Types: Oxycodone, Fentanyl    Comment: Fentanyl patch/oxycodone since 06/2018    Home Medications Prior to Admission medications   Medication Sig Start Date End Date Taking? Authorizing Provider  albuterol (VENTOLIN HFA) 108 (90 Base) MCG/ACT inhaler INHALE 2 PUFFS BY MOUTH EVERY 6  HOURS AS NEEDED FOR WHEEZE OR SHORTNESS OF BREATH Patient taking differently: Inhale 2 puffs into the lungs every 6 (six) hours as needed for wheezing or shortness of breath.  03/13/20   Vivi Barrack, MD  Amino Acids-Protein Hydrolys (FEEDING SUPPLEMENT, PRO-STAT SUGAR FREE 64,) LIQD Take 30 mLs by mouth 3 (three) times daily with meals. Patient taking differently: Take 30 mLs by mouth 2 (two) times daily.  03/26/19   Vivi Barrack, MD  amLODipine (NORVASC) 10 MG tablet Take 1 tablet (10 mg total) by mouth daily. 04/12/20   Mariel Aloe, MD  Blood Pressure Monitoring (BLOOD PRESSURE CUFF) MISC Use daily as needed to check blood pressure. 11/05/19   Vivi Barrack, MD  ciprofloxacin (CIPRO) 500 MG tablet Take 1 tablet (500 mg total) by mouth 2 (two) times daily. 04/20/20   Golden Circle, FNP  clonazePAM (KLONOPIN) 0.5 MG tablet Take 0.5 tablets (0.25 mg total) by mouth 3 (three) times daily as needed for anxiety. 02/25/20   Raulkar, Clide Deutscher, MD  dextromethorphan-guaiFENesin (MUCINEX DM) 30-600 MG 12hr tablet Take 2 tablets by mouth 2 (two) times daily.    [provider]  dronabinol (MARINOL) 2.5 MG capsule TAKE 1 CAPSULE (2.5 MG TOTAL) BY MOUTH 2 (TWO) TIMES DAILY BEFORE A MEAL. Patient taking differently: Take 2.5 mg by mouth 2 (two) times daily as needed (to encourage appetite).  07/29/19   Lovorn, Jinny Blossom, MD  ELIQUIS 5 MG TABS tablet TAKE 1 TABLET BY MOUTH TWICE A DAY 05/04/20   Vivi Barrack, MD  fentaNYL (DURAGESIC) 75 MCG/HR Place 1 patch onto the skin every other day. 05/11/20   Lovorn, Jinny Blossom, MD  gabapentin (NEURONTIN) 300 MG capsule TAKE 1 CAPSULE BY MOUTH 3 TIMES A DAY Patient taking differently: Take 300 mg by mouth 3 (three) times daily.  10/01/19   Lovorn, Jinny Blossom, MD  melatonin ([employer] MELATONIN) 3 MG TABS tablet Take 1 tablet (3 mg total) by mouth at bedtime. 11/01/19   Yaakov Guthrie, MD  methocarbamol (ROBAXIN) 500  MG tablet TAKE 1 TABLET BY MOUTH EVERY 6 HOURS AS  NEEDED FOR MUSCLE SPASMS Patient taking differently: Take 500 mg by mouth 4 (four) times daily.  11/06/19   Lovorn, Jinny Blossom, MD  mirabegron ER (MYRBETRIQ) 50 MG TB24 tablet Take 50 mg by mouth daily.    [provider]  Multiple Vitamin (MULTIVITAMIN WITH MINERALS) TABS tablet Take 1 tablet by mouth daily. 03/13/19   Kinnie Feil, MD  naloxegol oxalate (MOVANTIK) 12.5 MG TABS tablet Take 1 tablet (12.5 mg total) by mouth daily. 04/27/20   Golden Circle, FNP  naloxone [facility]) nasal spray 4 mg/0.1 mL To use if pt develops unconsciousness or confusion that family thinks is related to opioids. Patient taking differently: Place into the nose See admin instructions. "To use if pt develops unconsciousness or confusion that family thinks is related to opioids" 12/30/19   Lovorn, Jinny Blossom, MD  ondansetron (ZOFRAN) 8 MG tablet Take 1 tablet (8 mg total) by mouth every 8 (eight) hours as needed for nausea, vomiting or refractory nausea / vomiting (max dose in a day is 24 mg total). 03/26/20 03/26/21  Vivi Barrack, MD  oxybutynin (DITROPAN) 5 MG tablet Take 5 mg by mouth See admin instructions. Take 5 mg by mouth three times a day and an additional 5 mg once daily as needed for urinary urgency 11/13/19   [provider]  oxyCODONE-acetaminophen (PERCOCET) 5-325 MG tablet Take 1 tablet by mouth every 3 (three) hours as needed for severe pain. 05/11/20   Lovorn, Jinny Blossom, MD  PARoxetine (PAXIL) 40 MG tablet TAKE 1 TABLET BY MOUTH EVERYDAY AT BEDTIME - PA IN PROGRESS Patient taking differently: Take 40 mg by mouth at bedtime.  03/30/20   Lovorn, Jinny Blossom, MD  polyethylene glycol (MIRALAX / GLYCOLAX) 17 g packet Take 17 g by mouth 2 (two) times daily for 5 days, THEN 17 g daily. 04/11/20 05/16/20  Mariel Aloe, MD  Probiotic Product (PROBIOTIC COLON SUPPORT) CAPS Take 1 capsule by mouth daily.    [provider]  promethazine (PHENERGAN) 12.5 MG tablet Take 1 tablet (12.5 mg total) by mouth  every 6 (six) hours as needed for nausea, vomiting or refractory nausea / vomiting. 04/28/20   Lovorn, Jinny Blossom, MD  QUEtiapine (SEROQUEL) 50 MG tablet Take 50 mg by mouth at bedtime.  05/11/19   [provider]  Respiratory Therapy Supplies (SPIROMETER) KIT Use 3-4 times per hour. 11/05/19   Vivi Barrack, MD  senna-docusate (SENOKOT-S) 8.6-50 MG tablet Take 2 tablets by mouth 2 (two) times daily for 5 days, THEN 2 tablets at bedtime. 04/11/20 05/16/20  Mariel Aloe, MD  vancomycin (VANCOCIN) 125 MG capsule Take 1 capsule (125 mg total) by mouth 4 (four) times daily. 04/20/20   Golden Circle, FNP  dicyclomine (BENTYL) 20 MG tablet Take 1 tablet (20 mg total) by mouth 4 (four) times daily -  before meals and at bedtime. Patient not taking: Reported on 01/13/2020 11/01/19 01/13/20  Yaakov Guthrie, MD    Allergies    Omnicef [cefdinir]  Review of Systems   Review of Systems  Constitutional: Negative for fever.  Respiratory: Negative for cough.   Cardiovascular: Negative for chest pain.  Genitourinary:       Suprapubic catheter fell out    Physical Exam Updated Vital Signs BP 130/90 (BP Location: Left Arm)   Pulse 100   Temp 98.2 F (36.8 C) (Oral)   Resp (!) 22   Ht _0  (1.905  m)   Wt 81.6 kg   SpO2 97%   BMI 22.50 kg/m   Physical Exam Constitutional:      General: He is not in acute distress.    Appearance: He is well-developed.  Eyes:     Conjunctiva/sclera: Conjunctivae normal.  Cardiovascular:     Rate and Rhythm: Regular rhythm. Tachycardia present.  Pulmonary:     Effort: Pulmonary effort is normal.     Breath sounds: Normal breath sounds.  Abdominal:     Tenderness: There is no abdominal tenderness.     Comments: Ostomy bag to mid abdomen  Genitourinary:    Comments: Suprapubic ttp Skin:    General: Skin is warm and dry.  Neurological:     Mental Status: He is alert and oriented to person, place, and time.     ED Results / Procedures /  Treatments   Labs (all labs ordered are listed, but only abnormal results are displayed) Labs Reviewed - No data to display  EKG None  Radiology No results found.  Procedures Procedures (including critical care time)  Medications Ordered in ED Medications - No data to display  ED Course  I have reviewed the triage vital signs and the nursing notes.  Pertinent labs & imaging results that were available during my care of the patient were reviewed by me and considered in my medical decision making (see chart for details).    MDM Rules/Calculators/A&P                          54 year old man presenting for evaluation after suprapubic catheter fell out.  Denies other complaints.  This was replaced in the ED by Dr. Roxanne Mins.  Felt relief after placement of suprapubic catheter.  Will be discharged home to follow-up with urology.  Advised to return if worse.  Final Clinical Impression(s) / ED Diagnoses Final diagnoses:  None    Rx / DC Orders ED Discharge Orders    None       Rodney Booze, PA-C 05/15/20 0554    Rodney Booze, PA-C 55/25/89 [id number]    Delora Fuel, MD 75/83/07 [id number]    Delora Fuel, MD 29/84/73 [phone]

## 2020-05-15 NOTE — Telephone Encounter (Signed)
Thank you so much, glad to hear that

## 2020-05-15 NOTE — ED Notes (Signed)
Patient verbalizes understanding of discharge instructions. Opportunity for questioning and answers were provided. Armband removed by staff, pt discharged from ED via [facility] on stretcher to go home.

## 2020-05-18 ENCOUNTER — Ambulatory Visit (INDEPENDENT_AMBULATORY_CARE_PROVIDER_SITE_OTHER): Payer: Self-pay | Admitting: Family

## 2020-05-18 ENCOUNTER — Other Ambulatory Visit: Payer: Self-pay

## 2020-05-18 ENCOUNTER — Encounter: Payer: Self-pay | Admitting: Family

## 2020-05-18 VITALS — BP 112/62 | HR 89 | Temp 97.9°F

## 2020-05-18 DIAGNOSIS — R1084 Generalized abdominal pain: Secondary | ICD-10-CM

## 2020-05-18 DIAGNOSIS — M86652 Other chronic osteomyelitis, left thigh: Secondary | ICD-10-CM

## 2020-05-18 MED ORDER — DICYCLOMINE HCL 20 MG PO TABS: 20.0000 mg | ORAL_TABLET | Freq: Three times a day (TID) | ORAL | 0 refills | Status: DC | PRN

## 2020-05-18 NOTE — Progress Notes (Signed)
Subjective:    Patient ID: Christopher [name], male    DOB: 12-Mar-1966, 54 y.o.   MRN: [id number]  Chief Complaint  Patient presents with  . Follow-up    pain increase this AM,      HPI:  Christopher [name] is a 54 y.o. 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  Recently seen in the emergency department on 12/3 for dislodged suprapubic catheter which was replaced.  He is here today for routine follow-up.  Mr. Christopher [name] has been taking his ciprofloxacin and oral vancomycin as prescribed with no adverse side effects or missed doses since his last office visit.  In the last 48 to 72 hours has begun experiencing abdominal cramps with family concern for possible urinary tract infection although urine is currently clear and has no other symptoms.  Has a follow-up appointment with [facility] next week.  Allergies  Allergen Reactions  . Omnicef [Cefdinir] Nausea And Vomiting      Outpatient Medications Prior to Visit  Medication Sig Dispense Refill  . albuterol (VENTOLIN HFA) 108 (90 Base) MCG/ACT inhaler INHALE 2 PUFFS BY MOUTH EVERY 6 HOURS AS NEEDED FOR WHEEZE OR SHORTNESS OF BREATH (Patient taking differently: Inhale 2 puffs into the lungs every 6 (six) hours as needed for wheezing or shortness of breath. ) 18 each 1  . Amino Acids-Protein Hydrolys (FEEDING SUPPLEMENT, PRO-STAT SUGAR FREE 64,) LIQD Take 30 mLs by mouth 3 (three) times daily with meals. (Patient taking differently: Take 30 mLs by mouth 2 (two) times daily. ) 887 mL 0  . amLODipine (NORVASC) 10 MG tablet Take 1 tablet (10 mg total) by mouth daily. 30 tablet 0  . Blood Pressure Monitoring (BLOOD PRESSURE CUFF) MISC Use daily as needed to check blood pressure.  1 each 0  . ciprofloxacin (CIPRO) 500 MG tablet Take 1 tablet (500 mg total) by mouth 2 (two) times daily. 60 tablet 11  . clonazePAM (KLONOPIN) 0.5 MG tablet Take 0.5 tablets (0.25 mg total) by mouth 3 (three) times daily as needed for anxiety. 50 tablet 3  . dextromethorphan-guaiFENesin (MUCINEX DM) 30-600 MG 12hr tablet Take 2 tablets by mouth 2 (two) times daily.    Marland Kitchen dronabinol (MARINOL) 2.5 MG capsule TAKE 1 CAPSULE (2.5 MG TOTAL) BY MOUTH 2 (TWO) TIMES DAILY BEFORE A MEAL. (Patient taking differently: Take 2.5 mg by mouth 2 (two) times daily as needed (to encourage appetite). ) 60 capsule 5  . gabapentin (NEURONTIN) 300 MG capsule TAKE 1 CAPSULE BY MOUTH 3 TIMES A DAY (Patient taking differently: Take 300 mg by mouth 3 (three) times daily. ) 90 capsule 6  . melatonin ([employer] MELATONIN) 3 MG TABS tablet Take 1 tablet (3 mg total) by mouth at bedtime. 10 tablet 0  . methocarbamol (ROBAXIN) 500 MG tablet TAKE 1 TABLET BY MOUTH EVERY 6 HOURS AS NEEDED FOR MUSCLE SPASMS (Patient taking differently: Take 500 mg by mouth 4 (four) times daily. ) 100 tablet 11  . mirabegron ER (MYRBETRIQ) 50 MG TB24 tablet Take 50 mg by mouth daily.    . Multiple Vitamin (MULTIVITAMIN WITH MINERALS) TABS tablet Take 1 tablet by mouth daily.    . naloxegol oxalate (MOVANTIK) 12.5 MG TABS tablet Take 1 tablet (12.5 mg total) by mouth daily.  30 tablet 1  . naloxone (NARCAN) nasal spray 4 mg/0.1 mL To use if pt develops unconsciousness or confusion that family thinks is related to opioids. (Patient taking differently: Place into the nose See admin instructions. "To use if pt develops unconsciousness or confusion that family thinks is related to opioids") 1 each 3  . oxyCODONE-acetaminophen (PERCOCET) 5-325 MG tablet Take 1 tablet by mouth every 3 (three) hours as needed for severe pain. 180 tablet 0  . PARoxetine (PAXIL) 40 MG tablet TAKE 1 TABLET BY MOUTH EVERYDAY AT BEDTIME - PA IN PROGRESS (Patient taking differently: Take 40  mg by mouth at bedtime. ) 90 tablet 2  . Probiotic Product (PROBIOTIC COLON SUPPORT) CAPS Take 1 capsule by mouth daily.    . promethazine (PHENERGAN) 12.5 MG tablet Take 1 tablet (12.5 mg total) by mouth every 6 (six) hours as needed for nausea, vomiting or refractory nausea / vomiting. 90 tablet 5  . QUEtiapine (SEROQUEL) 50 MG tablet Take 50 mg by mouth at bedtime.     . vancomycin (VANCOCIN) 125 MG capsule Take 1 capsule (125 mg total) by mouth 4 (four) times daily. 120 capsule 11  . ELIQUIS 5 MG TABS tablet TAKE 1 TABLET BY MOUTH TWICE A DAY (Patient not taking: Reported on 05/18/2020) 60 tablet 2  . fentaNYL (DURAGESIC) 75 MCG/HR Place 1 patch onto the skin every other day. 15 patch 0  . ondansetron (ZOFRAN) 8 MG tablet Take 1 tablet (8 mg total) by mouth every 8 (eight) hours as needed for nausea, vomiting or refractory nausea / vomiting (max dose in a day is 24 mg total). (Patient not taking: Reported on 05/18/2020) 120 tablet 5  . oxybutynin (DITROPAN) 5 MG tablet Take 5 mg by mouth See admin instructions. Take 5 mg by mouth three times a day and an additional 5 mg once daily as needed for urinary urgency (Patient not taking: Reported on 05/18/2020)    . Respiratory Therapy Supplies (SPIROMETER) KIT Use 3-4 times per hour. 1 kit 0   No facility-administered medications prior to visit.     Past Medical History:  Diagnosis Date  . Acute on chronic respiratory failure with hypoxia ([place]) 06/2018   trach removed 11-16-2018, on vent from jan until may 2020 - uses albuterol prn  . Anxiety   . Bacteremia due to Pseudomonas 06/2018  . Chronic osteomyelitis ([place])   . Chronic pain syndrome   . Clostridium difficile colitis 10/30/2019   tx with abx   . Depression   . DVT (deep venous thrombosis) ([place]) 2020   right brachial post PICC line  . History of blood transfusion 06/2018  . History of kidney stones   . Hypertension    norvasc d/c by pcp on 11/05/19  . Multiple traumatic injuries   .  Penile pain 11/18/2019  . Pneumonia 11/2009   2020 x 2  . Gilford Rile as ambulation aid   . Wheelchair bound    electric  . Wound discharge    left hip wound with bloody/clear drainage change dressing q day surgilube with gauze, between legs wound using calcium algenate pad bid     Past Surgical History:  Procedure Laterality Date  . APPLICATION OF A-CELL OF BACK N/A 08/06/2018   Procedure: Application Of A-Cell Of Back;  Surgeon: [name], DO;  Location: [place];  Service: Plastics;  Laterality: N/A;  . APPLICATION OF A-CELL OF EXTREMITY Left 08/06/2018   Procedure: Application Of A-Cell Of Extremity;  Surgeon:  Dillingham, Loel Lofty, DO;  Location: [place];  Service: Plastics;  Laterality: Left;  . APPLICATION OF A-CELL OF EXTREMITY Left 09/18/2019   Procedure: APPLICATION OF A-CELL OF EXTREMITY;  Surgeon: [name], DO;  Location: [place];  Service: Plastics;  Laterality: Left;  . APPLICATION OF WOUND VAC  07/12/2018   Procedure: Application Of Wound Vac to the Left Thigh and Scrotum.;  Surgeon: Shona Needles, MD;  Location: [place];  Service: Orthopedics;;  . APPLICATION OF WOUND VAC  07/10/2018   Procedure: Application Of Wound Vac;  Surgeon: Clovis Riley, MD;  Location: [place];  Service: General;;  . COLON SURGERY  2020   colostomy  . COLOSTOMY N/A 07/23/2018   Procedure: COLOSTOMY;  Surgeon: Georganna Skeans, MD;  Location: [place];  Service: General;  Laterality: N/A;  . CYSTOSCOPY W/ URETERAL STENT PLACEMENT N/A 07/15/2018   Procedure: RETROGRADE URETHROGRAM;  Surgeon: Franchot Gallo, MD;  Location: [place];  Service: Urology;  Laterality: N/A;  . CYSTOSCOPY WITH LITHOLAPAXY N/A 05/06/2019   Procedure: CYSTOSCOPY BASKET BLADDER STONE EXTRACTION;  Surgeon: Cleon Gustin, MD;  Location: [facility];  Service: Urology;  Laterality: N/A;  30 MINS  . CYSTOSTOMY N/A 05/06/2019   Procedure: REPLACEMENT OF SUPRAPUBIC CATHETER;  Surgeon: Cleon Gustin, MD;   Location[facility];  Service: Urology;  Laterality: N/A;  . DEBRIDEMENT AND CLOSURE WOUND Left 03/04/2019   Procedure: Excision of hip wound with placement of Acell;  Surgeon: [name], DO;  Location: [place];  Service: Plastics;  Laterality: Left;  . ESOPHAGOGASTRODUODENOSCOPY N/A 08/14/2018   Procedure: ESOPHAGOGASTRODUODENOSCOPY (EGD);  Surgeon: Georganna Skeans, MD;  Location: [place];  Service: General;  Laterality: N/A;  bedside  . FACIAL RECONSTRUCTION SURGERY     X 2--once as a teenager and second time in his 1's  . HARDWARE REMOVAL Left 03/04/2019   Procedure: Left Hip Hardware Removal;  Surgeon: Shona Needles, MD;  Location: [place];  Service: Orthopedics;  Laterality: Left;  . HIP PINNING,CANNULATED Left 07/12/2018   Procedure: CANNULATED HIP PINNING;  Surgeon: Shona Needles, MD;  Location: [place];  Service: Orthopedics;  Laterality: Left;  . HIP SURGERY    . HOLMIUM LASER APPLICATION Right 08/19/7562   Procedure: HOLMIUM LASER APPLICATION;  Surgeon: Cleon Gustin, MD;  Location: [facility];  Service: Urology;  Laterality: Right;  . I & D EXTREMITY Left 07/25/2018   Procedure: Debridement of buttock, scrotum and left leg, placement of acell and vac;  Surgeon: [name], DO;  Location: [place];  Service: Plastics;  Laterality: Left;  . I & D EXTREMITY N/A 08/06/2018   Procedure: Debridement of buttock, scrotum and left leg;  Surgeon: [name], DO;  Location: [place];  Service: Plastics;  Laterality: N/A;  . I & D EXTREMITY N/A 08/13/2018   Procedure: Debridement of buttock, scrotum and left leg, placement of acell and vac;  Surgeon: [name], DO;  Location: [place];  Service: Plastics;  Laterality: N/A;  90 min, please  . INCISION AND DRAINAGE HIP Left 09/18/2019   Procedure: IRRIGATION AND DEBRIDEMENT HIP/ PELVIS WITH WOUND VAC PLACEMENT;  Surgeon: Shona Needles, MD;  Location: [place];  Service: Orthopedics;   Laterality: Left;  . INCISION AND DRAINAGE OF WOUND N/A 07/18/2018   Procedure: Debridement of left leg, buttocks and scrotal wound with placement of acell and Flexiseal;  Surgeon: [name], DO;  Location: [place];  Service: Plastics;  Laterality: N/A;  . INCISION AND DRAINAGE OF WOUND Left 08/29/2018   Procedure: Debridement of buttock, scrotum and left leg, placement of acell and vac;  Surgeon: [name], DO;  Location: [place];  Service: Plastics;  Laterality: Left;  75 min, please  . INCISION AND DRAINAGE OF WOUND Bilateral 10/23/2018   Procedure: DEBRIDEMENT OF BUTTOCK,SCROTUM, AND LEG WOUNDS WITH PLACEMENT OF ACELL- BILATERAL 90 MIN;  Surgeon: [name], DO;  Location: [place];  Service: Plastics;  Laterality: Bilateral;  . IR ANGIOGRAM PELVIS SELECTIVE OR SUPRASELECTIVE  07/10/2018  . IR ANGIOGRAM PELVIS SELECTIVE OR SUPRASELECTIVE  07/10/2018  . IR ANGIOGRAM SELECTIVE EACH ADDITIONAL VESSEL  07/10/2018  . IR EMBO ART  VEN HEMORR LYMPH EXTRAV  INC GUIDE ROADMAPPING  07/10/2018  . IR NEPHROSTOMY PLACEMENT LEFT  04/05/2019  . IR NEPHROSTOMY PLACEMENT RIGHT  05/31/2019  . IR US GUIDE BX ASP/DRAIN  07/10/2018  . IR US GUIDE VASC ACCESS RIGHT  07/10/2018  . IR VENO/EXT/UNI LEFT  07/10/2018  . IRRIGATION AND DEBRIDEMENT OF WOUND WITH SPLIT THICKNESS SKIN GRAFT Left 09/19/2018   Procedure: Debridement of gluteal wound with placement of acell to left leg.;  Surgeon: [name], DO;  Location: [place];  Service: Plastics;  Laterality: Left;  2.5 hours, please  . LAPAROTOMY N/A 07/12/2018   Procedure: EXPLORATORY LAPAROTOMY;  Surgeon: Georganna Skeans, MD;  Location: [place];  Service: General;  Laterality: N/A;  . LAPAROTOMY N/A 07/15/2018   Procedure: WOUND EXPLORATION; CLOSURE OF ABDOMEN;  Surgeon: Georganna Skeans, MD;  Location: [place];  Service: General;  Laterality: N/A;  . LAPAROTOMY  07/10/2018   Procedure: Exploratory Laparotomy;  Surgeon: Clovis Riley, MD;  Location:  [place];  Service: General;;  . MASS EXCISION Left 09/18/2019   Procedure: EXCISION UPPER LEFT INNER THIGH WOUND;  Surgeon: [name], DO;  Location: [place];  Service: Plastics;  Laterality: Left;  . NEPHROLITHOTOMY Right 07/15/2019   Procedure: NEPHROLITHOTOMY PERCUTANEOUS;  Surgeon: Cleon Gustin, MD;  Location: [facility];  Service: Urology;  Laterality: Right;  90 MINS  . PEG PLACEMENT N/A 08/14/2018   Procedure: PERCUTANEOUS ENDOSCOPIC GASTROSTOMY (PEG) PLACEMENT;  Surgeon: Georganna Skeans, MD;  Location: [place];  Service: General;  Laterality: N/A;  . PERCUTANEOUS TRACHEOSTOMY N/A 08/02/2018   Procedure: PERCUTANEOUS TRACHEOSTOMY;  Surgeon: Georganna Skeans, MD;  Location: [place];  Service: General;  Laterality: N/A;  . RADIOLOGY WITH ANESTHESIA N/A 07/10/2018   Procedure: IR WITH ANESTHESIA;  Surgeon: Sandi Mariscal, MD;  Location: [place];  Service: Radiology;  Laterality: N/A;  . RADIOLOGY WITH ANESTHESIA Right 07/10/2018   Procedure: Ir With Anesthesia;  Surgeon: Sandi Mariscal, MD;  Location: [place];  Service: Radiology;  Laterality: Right;  . SCROTAL EXPLORATION N/A 07/15/2018   Procedure: SCROTUM DEBRIDEMENT;  Surgeon: Franchot Gallo, MD;  Location: [place];  Service: Urology;  Laterality: N/A;  . SHOULDER SURGERY    . SKIN SPLIT GRAFT Right 09/19/2018   Procedure: Skin Graft Split Thickness;  Surgeon: [name], DO;  Location: [place];  Service: Plastics;  Laterality: Right;  . SKIN SPLIT GRAFT N/A 10/03/2018   Procedure: Split thickness skin graft to gluteal area with acell placement;  Surgeon: [name], DO;  Location: [place];  Service: Plastics;  Laterality: N/A;  3 hours, please  . VACUUM ASSISTED CLOSURE CHANGE N/A 07/12/2018   Procedure: ABDOMINAL VACUUM ASSISTED CLOSURE CHANGE and abdominal  washout;  Surgeon: Georganna Skeans, MD;  Location: [place];  Service: General;  Laterality: N/A;  . WOUND DEBRIDEMENT Left 07/23/2018   Procedure:  DEBRIDEMENT LEFT BUTTOCK  WOUND;  Surgeon: Georganna Skeans, MD;  Location: [place];  Service: General;  Laterality: Left;  . WOUND EXPLORATION Left 07/10/2018   Procedure: WOUND EXPLORATION LEFT GROIN;  Surgeon: Clovis Riley, MD;  Location: [place];  Service: General;  Laterality: Left;       Review of Systems  Constitutional: Negative for chills, diaphoresis, fatigue and fever.  Respiratory: Negative for cough, chest tightness, shortness of breath and wheezing.   Cardiovascular: Negative for chest pain.  Gastrointestinal: Positive for abdominal pain. Negative for diarrhea, nausea and vomiting.      Objective:    BP 112/62   Pulse 89   Temp 97.9 F (36.6 C) (Oral)  Nursing note and vital signs reviewed.  Physical Exam Constitutional:      General: He is not in acute distress.    Appearance: He is well-developed.     Comments: Seated in the wheelchair; pleasant; uncomfortable at times  Cardiovascular:     Rate and Rhythm: Normal rate and regular rhythm.     Heart sounds: Normal heart sounds.  Pulmonary:     Effort: Pulmonary effort is normal.     Breath sounds: Normal breath sounds.  Skin:    General: Skin is warm and dry.  Neurological:     Mental Status: He is alert.  Psychiatric:        Mood and Affect: Mood normal.      Depression screen [facility] 2/9 03/17/2020 12/30/2019 12/18/2019 04/10/2019  Decreased Interest 0 0 0 0  Down, Depressed, Hopeless 1 0 0 0  PHQ - 2 Score 1 0 0 0  Some recent data might be hidden       Assessment & Plan:    Patient Active Problem List   Diagnosis Date Noted  . Hypertensive urgency 04/11/2020  . Therapeutic opioid induced constipation 04/11/2020  . Abdominal pain 04/09/2020  . Status post peripherally inserted central catheter (PICC) central line placement 03/17/2020  . Skin-picking disorder 03/12/2020  . CHF (congestive heart failure) ([place]) 01/27/2020  . Bone infection of pelvis ([place]) 12/12/2019  . Recurrent pain of right knee  11/08/2019  . Elevated blood pressure reading 11/05/2019  . Acute osteomyelitis of pelvic region and thigh ([facility])   . Acute deep vein thrombosis (DVT) of brachial vein of right upper extremity ([place]) 11/01/2019  . Myofascial pain dysfunction syndrome 08/05/2019  . Protein-calorie malnutrition, severe 06/01/2019  . Chronic osteomyelitis of pelvis, left ([place]) 03/07/2019  . Wound infection, posttraumatic 02/28/2019  . Hydronephrosis concurrent with and due to calculi of kidney and ureter 02/28/2019  . GERD (gastroesophageal reflux disease) 02/28/2019  . Suprapubic catheter ([place]) 02/28/2019  . Protein-calorie malnutrition, moderate ([place]) 02/28/2019  . 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 Reactive depression 01/29/2019  . Chronic pain due to trauma 01/29/2019  . Neuropathic pain 01/29/2019  . Colostomy status ([place]) 01/14/2019  . Insomnia 01/14/2019  . Tachycardia 01/14/2019  . Current use of proton pump inhibitor 01/14/2019  . Wound of buttock 12/28/2018  . Anxiety and depression 12/17/2018  . Debility 11/23/2018  . Chronic pain syndrome   . Multiple traumatic injuries   . Pressure injury of skin 08/13/2018  . Urethral injury 07/13/2018     Problem List Items Addressed This Visit      Musculoskeletal and Integument  Chronic osteomyelitis of pelvis, left [facility])    Mr. Santana has remained stable since discontinuation of IV vancomycin with no fevers, chills, or worsening of symptoms.  Discussed the plan of care to continue current dose of ciprofloxacin with oral vancomycin for C. difficile prevention.  He has follow-up with [facility] next week.  Plan for follow-up in 2 months or sooner if needed.        Other   Abdominal pain - Primary    Mr. Merfeld has acute onset abdominal cramping that waxes and wanes with no particular patterns and of unclear origin.  No trauma or injury.  Continues to take the Movantik as prescribed  and is having formed stools.  Start Bentyl as needed for abdominal cramping.  Continue to monitor.          I have changed Boysie B. Kress's dicyclomine. I am also having him maintain his multivitamin with minerals, feeding supplement (PRO-STAT SUGAR FREE 64), QUEtiapine, dronabinol, gabapentin, melatonin, Spirometer, Blood Pressure Cuff, methocarbamol, mirabegron ER, oxybutynin, naloxone, dextromethorphan-guaiFENesin, Probiotic Colon Support, clonazePAM, albuterol, ondansetron, PARoxetine, amLODipine, vancomycin, ciprofloxacin, naloxegol oxalate, promethazine, Eliquis, fentaNYL, and oxyCODONE-acetaminophen.   Meds ordered this encounter  Medications  . dicyclomine (BENTYL) 20 MG tablet    Sig: Take 1 tablet (20 mg total) by mouth 3 (three) times daily as needed for spasms.    Dispense:  30 tablet    Refill:  0    Order Specific Question:   Supervising Provider    Answer:   Carlyle Basques [[id number]]     Follow-up: Return in about 2 months (around 07/19/2020), or if symptoms worsen or fail to improve.   Terri Piedra, MSN, FNP-C Nurse Practitioner [facility] [place] number: [phone]

## 2020-05-18 NOTE — Assessment & Plan Note (Signed)
Christopher [name] has remained stable since discontinuation of IV vancomycin with no fevers, chills, or worsening of symptoms.  Discussed the plan of care to continue current dose of ciprofloxacin with oral vancomycin for C. difficile prevention.  He has follow-up with [facility] next week.  Plan for follow-up in 2 months or sooner if needed.

## 2020-05-18 NOTE — Patient Instructions (Signed)
Nice to see you.  We will continue your current dose of Ciprofloxacin and oral vancomcyin.   Follow up with [facility] as scheduled.   Start bentyl as needed for abdominal cramping.  Plan for follow up in 2 months or sooner if needed.   Have a great day and stay safe!  Happy Holidays!

## 2020-05-18 NOTE — Assessment & Plan Note (Signed)
Christopher [name] has acute onset abdominal cramping that waxes and wanes with no particular patterns and of unclear origin.  No trauma or injury.  Continues to take the Movantik as prescribed and is having formed stools.  Start Bentyl as needed for abdominal cramping.  Continue to monitor.

## 2020-05-23 ENCOUNTER — Other Ambulatory Visit: Payer: Self-pay | Admitting: Physical Medicine and Rehabilitation

## 2020-05-24 ENCOUNTER — Other Ambulatory Visit: Payer: Self-pay | Admitting: Family Medicine

## 2020-05-25 ENCOUNTER — Other Ambulatory Visit: Payer: Self-pay | Admitting: Family Medicine

## 2020-05-25 NOTE — Telephone Encounter (Signed)
Refill request for Gabapentin. No mention in last note to continue. Is it okay to refill?

## 2020-05-26 ENCOUNTER — Telehealth: Payer: Self-pay

## 2020-05-26 NOTE — Telephone Encounter (Signed)
Spoke with Dr. Lysle Rubens regarding Mr. Dia and results of testing.

## 2020-05-26 NOTE — Telephone Encounter (Signed)
Received call from [facility], Dr. Lysle Rubens requesting MIC breakpoint and wound culture report. Routing to [place] to make aware to page Dr. Duanne Moron. Valarie Cones

## 2020-05-26 NOTE — Telephone Encounter (Signed)
error 

## 2020-05-29 ENCOUNTER — Telehealth: Payer: Self-pay | Admitting: *Deleted

## 2020-05-29 NOTE — Telephone Encounter (Signed)
Mrs Aiken called because Mr Duerst is going to be out of his Fentanyl patches as of Monday. He placed a patch on Friday and has two left which means last patch will be placed on Monday.  The pharmacy only had #10 patches instead of the 15 Rx'd. This is proven by PMP disp record as well on 05/13/20 #10. Marland Kitchen

## 2020-06-01 ENCOUNTER — Telehealth: Payer: Self-pay

## 2020-06-01 NOTE — Telephone Encounter (Signed)
Spoke with patient regarding a PA received for cipro. Patient states his wife picked up his prescription today from [facility] in [place]. Patient states his medication is still covered under his workers' comp and his should not need a PA Christopher [name] Christopher [name]

## 2020-06-01 NOTE — Telephone Encounter (Signed)
Mrs. Arnett called back: Mr. Esson has placed his last patch on. He is currently out of Fentanyl patches.   Please advise. Thank you

## 2020-06-02 ENCOUNTER — Ambulatory Visit: Payer: Self-pay | Admitting: Surgical

## 2020-06-02 MED ORDER — FENTANYL 75 MCG/HR TD PT72: 1.0000 | MEDICATED_PATCH | TRANSDERMAL | 0 refills | Status: DC

## 2020-06-02 NOTE — Telephone Encounter (Signed)
Notified it was sent to the pharmacy.

## 2020-06-02 NOTE — Telephone Encounter (Signed)
Mrs Ardith Dark called again stating that Theon will need a Fentanyl patch to place tomorrow.  He is out.

## 2020-06-04 ENCOUNTER — Other Ambulatory Visit: Payer: Self-pay | Admitting: Family

## 2020-06-04 DIAGNOSIS — Z9189 Other specified personal risk factors, not elsewhere classified: Secondary | ICD-10-CM

## 2020-06-10 ENCOUNTER — Encounter
Payer: No Typology Code available for payment source | Attending: Physical Medicine and Rehabilitation | Admitting: Physical Medicine and Rehabilitation

## 2020-06-10 ENCOUNTER — Other Ambulatory Visit: Payer: Self-pay

## 2020-06-10 ENCOUNTER — Encounter: Payer: Self-pay | Admitting: Physical Medicine and Rehabilitation

## 2020-06-10 VITALS — BP 122/87 | HR 86 | Temp 97.9°F

## 2020-06-10 DIAGNOSIS — Z9359 Other cystostomy status: Secondary | ICD-10-CM | POA: Diagnosis not present

## 2020-06-10 DIAGNOSIS — F32A Depression, unspecified: Secondary | ICD-10-CM | POA: Diagnosis present

## 2020-06-10 DIAGNOSIS — F419 Anxiety disorder, unspecified: Secondary | ICD-10-CM

## 2020-06-10 DIAGNOSIS — M869 Osteomyelitis, unspecified: Secondary | ICD-10-CM

## 2020-06-10 DIAGNOSIS — G8921 Chronic pain due to trauma: Secondary | ICD-10-CM | POA: Diagnosis not present

## 2020-06-10 DIAGNOSIS — R5381 Other malaise: Secondary | ICD-10-CM

## 2020-06-10 DIAGNOSIS — M86652 Other chronic osteomyelitis, left thigh: Secondary | ICD-10-CM | POA: Diagnosis not present

## 2020-06-10 DIAGNOSIS — M792 Neuralgia and neuritis, unspecified: Secondary | ICD-10-CM | POA: Diagnosis present

## 2020-06-10 MED ORDER — TRAZODONE HCL 100 MG PO TABS: 100.0000 mg | ORAL_TABLET | Freq: Every day | ORAL | 5 refills | Status: DC

## 2020-06-10 MED ORDER — OXYCODONE-ACETAMINOPHEN 10-325 MG PO TABS
1.0000 | ORAL_TABLET | ORAL | 0 refills | Status: DC | PRN
Start: 2020-06-10 — End: 2020-07-22

## 2020-06-10 MED ORDER — METHADONE HCL 5 MG PO TABS: 5.0000 mg | ORAL_TABLET | Freq: Two times a day (BID) | ORAL | 0 refills | Status: DC

## 2020-06-10 NOTE — Patient Instructions (Signed)
Pt is a 54 yr old male with hx of Multitrauma- causing L femoral neck fx, degloving of L hip to going/scrotum, bladder neck trauma- got SPC, developed compartment syndrome -s/p surgery for that; also diverting colostomy, skin grafts, and s/p trachand PEG- PEG is out. 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 B/L tennis elbow new-and B/L pending/forming ulnar neuropathy Osteomyelitis of L hip found again-on  PO vanc and Cipro  1. NOW- Will add low dose methadone-  5mg  2x/day. Watch for confusion. Have to call pharmacy to try and get them to do this- and d/w     2. IN ONE WEEK- increase the Percocet to 10 mg/325 q3 hours as needed.  Try to do at q4 hours if possible.   3. Need to Wean Quetiapine to 25 mg nightly x 1 week, then see if can stop it, due to QT prolongation with Methadone.  On for sleep.   4. Will replace Quetiapine with Trazodone - 50 mg nightly- but can increase to 200 mg nightly. Will send in 100 mg tablet.   5. If this regimen works better, will NEED to get up.  6. If Methadone helps some, can switch slowly over 1-2 months to Methadone from Duragesic patch- could also help poor memory/clognitive slowing issues that's he's experiencing.   7. F/u in 6 weeks- double appointment.

## 2020-06-10 NOTE — Progress Notes (Signed)
Subjective:    Patient ID: Christopher [name], male    DOB: February 21, 1966, 54 y.o.   MRN: [id number]  HPI    Pt is a 54 yr old male with hx of Multitrauma- causing L femoral neck fx, degloving of L hip to going/scrotum, bladder neck trauma- got SPC, developed compartment syndrome -s/p surgery for that; also diverting colostomy, skin grafts, and s/p trachand PEG- PEG is out. 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 B/L tennis elbow new-and B/L pending/forming ulnar neuropathy Osteomyelitis of L hip found again-on  PO vanc and Cipro  Pain has been worse for 2.5 weeks.  Doesn't want to do anything cannot get out of bed.  Too much stimulation. Couldn't watch kids open presents.  Can barely sit up to eat.  Eating fine- getting enough calories.   [facility] was a bust! Couldn't give any kind of Guarantees- risk is "too high".   Benefit of surgery- to remove L sacrum and L pelvis.   Still having twitching  Also per wife, forgetting conversations, esp since in bed all the time.  Not remembering everything, but also don't forgetting everything either.      Pain Inventory Average Pain 9 Pain Right Now 9 My pain is constant, sharp, burning, dull, stabbing, tingling and aching  In the last 24 hours, has pain interfered with the following? General activity 9 Relation with others 9 Enjoyment of life 10 What TIME of day is your pain at its worst? morning , daytime, evening and night Sleep (in general) Fair  Pain is worse with: walking, bending, sitting, inactivity, standing, unsure and some activites Pain improves with: medication Relief from Meds: 8  Family History  Problem Relation Age of Onset  . Breast cancer Mother        with mets to the bones   Social  History   Socioeconomic History  . Marital status: Married    Spouse name: Not on file  . Number of children: Not on file  . Years of education: Not on file  . Highest education level: Not on file  Occupational History  . Not on file  Tobacco Use  . Smoking status: Former Smoker    Packs/day: 1.00    Years: 20.00    Pack years: 20.00    Types: Cigarettes    Quit date: 07/10/2018    Years since quitting: 1.9  . Smokeless tobacco: Never Used  Vaping Use  . Vaping Use: Never used  Substance and Sexual Activity  . Alcohol use: Never  . Drug use: Yes    Types: Oxycodone, Fentanyl    Comment: Fentanyl patch/oxycodone since 06/2018  . Sexual activity: Yes  Other Topics Concern  . Not on file  Social History Narrative   ** Merged History Encounter **       Social Determinants of Health   Financial Resource Strain: Not on file  Food Insecurity: Not on file  Transportation Needs: Not on file  Physical Activity: Not on file  Stress: Not on file  Social Connections: Not on file   Past Surgical History:  Procedure Laterality Date  . APPLICATION OF A-CELL OF BACK N/A 08/06/2018   Procedure: Application Of A-Cell Of Back;  Surgeon: Peggye Form, DO;  [facility];  Service: Government social research officer;  Laterality: N/A;  . APPLICATION OF A-CELL OF EXTREMITY Left 08/06/2018   Procedure: Application Of A-Cell Of Extremity;  Surgeon: Peggye Form, DO;  [facility];  Service: Plastics;  Laterality: Left;  . APPLICATION OF A-CELL OF EXTREMITY Left 09/18/2019   Procedure: APPLICATION OF A-CELL OF EXTREMITY;  Surgeon: Peggye Form, DO;  [facility];  Service: Plastics;  Laterality: Left;  . APPLICATION OF WOUND VAC  07/12/2018   Procedure: Application Of Wound Vac to the Left Thigh and Scrotum.;  Surgeon: Roby Lofts, MD;  [facility];  Service: Orthopedics;;  . APPLICATION OF WOUND VAC  07/10/2018   Procedure: Application Of Wound Vac;  Surgeon: Berna Bue, MD;   [facility];  Service: General;;  . COLON SURGERY  2020   colostomy  . COLOSTOMY N/A 07/23/2018   Procedure: COLOSTOMY;  Surgeon: Violeta Gelinas, MD;  Location: [facility] OR;  Service: General;  Laterality: N/A;  . CYSTOSCOPY W/ URETERAL STENT PLACEMENT N/A 07/15/2018   Procedure: RETROGRADE URETHROGRAM;  Surgeon: Marcine Matar, MD;  Location: [facility] OR;  Service: Urology;  Laterality: N/A;  . CYSTOSCOPY WITH LITHOLAPAXY N/A 05/06/2019   Procedure: CYSTOSCOPY BASKET BLADDER STONE EXTRACTION;  Surgeon: Malen Gauze, MD;  Location: [facility];  Service: Urology;  Laterality: N/A;  30 MINS  . CYSTOSTOMY N/A 05/06/2019   Procedure: REPLACEMENT OF SUPRAPUBIC CATHETER;  Surgeon: Malen Gauze, MD;  [facility];  Service: Urology;  Laterality: N/A;  . DEBRIDEMENT AND CLOSURE WOUND Left 03/04/2019   Procedure: Excision of hip wound with placement of Acell;  Surgeon: Peggye Form, DO;  [facility];  Service: Plastics;  Laterality: Left;  . ESOPHAGOGASTRODUODENOSCOPY N/A 08/14/2018   Procedure: ESOPHAGOGASTRODUODENOSCOPY (EGD);  Surgeon: Violeta Gelinas, MD;  Location: [facility] ENDOSCOPY;  Service: General;  Laterality: N/A;  bedside  . FACIAL RECONSTRUCTION SURGERY     X 2--once as a teenager and second time in his 63's  . HARDWARE REMOVAL Left 03/04/2019   Procedure: Left Hip Hardware Removal;  Surgeon: Roby Lofts, MD;  [facility];  Service: Orthopedics;  Laterality: Left;  . HIP PINNING,CANNULATED Left 07/12/2018   Procedure: CANNULATED HIP PINNING;  Surgeon: Roby Lofts, MD;  [facility];  Service: Orthopedics;  Laterality: Left;  . HIP SURGERY    . HOLMIUM LASER APPLICATION Right 07/15/2019   Procedure: HOLMIUM LASER APPLICATION;  Surgeon: Malen Gauze, MD;  Location: [facility];  Service: Urology;  Laterality: Right;  . I & D EXTREMITY Left 07/25/2018   Procedure: Debridement of buttock, scrotum and left  leg, placement of acell and vac;  Surgeon: Peggye Form, DO;  [facility];  Service: Plastics;  Laterality: Left;  . I & D EXTREMITY N/A 08/06/2018   Procedure: Debridement of buttock, scrotum and left leg;  Surgeon: Peggye Form, DO;  [facility];  Service: Plastics;  Laterality: N/A;  . I & D EXTREMITY N/A 08/13/2018   Procedure: Debridement of buttock, scrotum and left leg, placement of acell and vac;  Surgeon: Peggye Form, DO;  [facility];  Service: Plastics;  Laterality: N/A;  90 min, please  . INCISION AND DRAINAGE HIP Left 09/18/2019   Procedure: IRRIGATION AND DEBRIDEMENT HIP/ PELVIS WITH WOUND VAC PLACEMENT;  Surgeon: Roby Lofts, MD;  [facility];  Service: Orthopedics;  Laterality: Left;  . INCISION AND DRAINAGE OF WOUND N/A 07/18/2018  Procedure: Debridement of left leg, buttocks and scrotal wound with placement of acell and Flexiseal;  Surgeon: Peggye Formillingham, Claire S, DO;  [facility];  Service: Plastics;  Laterality: N/A;  . INCISION AND DRAINAGE OF WOUND Left 08/29/2018   Procedure: Debridement of buttock, scrotum and left leg, placement of acell and vac;  Surgeon: Peggye Formillingham, Claire S, DO;  [facility];  Service: Plastics;  Laterality: Left;  75 min, please  . INCISION AND DRAINAGE OF WOUND Bilateral 10/23/2018   Procedure: DEBRIDEMENT OF BUTTOCK,SCROTUM, AND LEG WOUNDS WITH PLACEMENT OF ACELL- BILATERAL 90 MIN;  Surgeon: Peggye Formillingham, Claire S, DO;  [facility];  Service: Plastics;  Laterality: Bilateral;  . IR ANGIOGRAM PELVIS SELECTIVE OR SUPRASELECTIVE  07/10/2018  . IR ANGIOGRAM PELVIS SELECTIVE OR SUPRASELECTIVE  07/10/2018  . IR ANGIOGRAM SELECTIVE EACH ADDITIONAL VESSEL  07/10/2018  . IR EMBO ART  VEN HEMORR LYMPH EXTRAV  INC GUIDE ROADMAPPING  07/10/2018  . IR NEPHROSTOMY PLACEMENT LEFT  04/05/2019  . IR NEPHROSTOMY PLACEMENT RIGHT  05/31/2019  . IR US GUIDE BX ASP/DRAIN  07/10/2018  . IR US GUIDE VASC ACCESS RIGHT  07/10/2018  .  IR VENO/EXT/UNI LEFT  07/10/2018  . IRRIGATION AND DEBRIDEMENT OF WOUND WITH SPLIT THICKNESS SKIN GRAFT Left 09/19/2018   Procedure: Debridement of gluteal wound with placement of acell to left leg.;  Surgeon: Peggye Formillingham, Claire S, DO;  [facility];  Service: Plastics;  Laterality: Left;  2.5 hours, please  . LAPAROTOMY N/A 07/12/2018   Procedure: EXPLORATORY LAPAROTOMY;  Surgeon: Violeta Gelinashompson, Burke, MD;  [facility];  Service: General;  Laterality: N/A;  . LAPAROTOMY N/A 07/15/2018   Procedure: WOUND EXPLORATION; CLOSURE OF ABDOMEN;  Surgeon: Violeta Gelinashompson, Burke, MD;  [facility];  Service: General;  Laterality: N/A;  . LAPAROTOMY  07/10/2018   Procedure: Exploratory Laparotomy;  Surgeon: Berna Bueonnor, Chelsea A, MD;  [facility];  Service: General;;  . MASS EXCISION Left 09/18/2019   Procedure: EXCISION UPPER LEFT INNER THIGH WOUND;  Surgeon: Peggye Formillingham, Claire S, DO;  [facility];  Service: Plastics;  Laterality: Left;  . NEPHROLITHOTOMY Right 07/15/2019   Procedure: NEPHROLITHOTOMY PERCUTANEOUS;  Surgeon: Malen GauzeMcKenzie, Patrick L, MD;  [facility];  Service: Urology;  Laterality: Right;  90 MINS  . PEG PLACEMENT N/A 08/14/2018   Procedure: PERCUTANEOUS ENDOSCOPIC GASTROSTOMY (PEG) PLACEMENT;  Surgeon: Violeta Gelinashompson, Burke, MD;  [facility];  Service: General;  Laterality: N/A;  . PERCUTANEOUS TRACHEOSTOMY N/A 08/02/2018   Procedure: PERCUTANEOUS TRACHEOSTOMY;  Surgeon: Violeta Gelinashompson, Burke, MD;  [facility];  Service: General;  Laterality: N/A;  . RADIOLOGY WITH ANESTHESIA N/A 07/10/2018   Procedure: IR WITH ANESTHESIA;  Surgeon: Simonne ComeWatts, John, MD;  [facility];  Service: Radiology;  Laterality: N/A;  . RADIOLOGY WITH ANESTHESIA Right 07/10/2018   Procedure: Ir With Anesthesia;  Surgeon: Simonne ComeWatts, John, MD;  [facility];  Service: Radiology;  Laterality: Right;  . SCROTAL EXPLORATION N/A 07/15/2018   Procedure: SCROTUM DEBRIDEMENT;  Surgeon: Marcine Matarahlstedt, Stephen, MD;   [facility];  Service: Urology;  Laterality: N/A;  . SHOULDER SURGERY    . SKIN SPLIT GRAFT Right 09/19/2018   Procedure: Skin Graft Split Thickness;  Surgeon: Peggye Formillingham, Claire S, DO;  [facility];  Service: Plastics;  Laterality: Right;  . SKIN SPLIT GRAFT N/A 10/03/2018   Procedure: Split thickness skin graft to gluteal area with acell placement;  Surgeon: Peggye Formillingham, Claire S, DO;  [facility];  Service: Plastics;  Laterality:  N/A;  3 hours, please  . VACUUM ASSISTED CLOSURE CHANGE N/A 07/12/2018   Procedure: ABDOMINAL VACUUM ASSISTED CLOSURE CHANGE and abdominal washout;  Surgeon: Violeta Gelinas, MD;  Location: [facility] OR;  Service: General;  Laterality: N/A;  . WOUND DEBRIDEMENT Left 07/23/2018   Procedure: DEBRIDEMENT LEFT BUTTOCK  WOUND;  Surgeon: Violeta Gelinas, MD;  Location[facility] OR;  Service: General;  Laterality: Left;  . WOUND EXPLORATION Left 07/10/2018   Procedure: WOUND EXPLORATION LEFT GROIN;  Surgeon: Berna Bue, MD;  Location: [facility] OR;  Service: General;  Laterality: Left;   Past Surgical History:  Procedure Laterality Date  . APPLICATION OF A-CELL OF BACK N/A 08/06/2018   Procedure: Application Of A-Cell Of Back;  Surgeon: Peggye Form, DO;  [facility];  Service: Plastics;  Laterality: N/A;  . APPLICATION OF A-CELL OF EXTREMITY Left 08/06/2018   Procedure: Application Of A-Cell Of Extremity;  Surgeon: Peggye Form, DO;  [facility];  Service: Plastics;  Laterality: Left;  . APPLICATION OF A-CELL OF EXTREMITY Left 09/18/2019   Procedure: APPLICATION OF A-CELL OF EXTREMITY;  Surgeon: Peggye Form, DO;  [facility];  Service: Plastics;  Laterality: Left;  . APPLICATION OF WOUND VAC  07/12/2018   Procedure: Application Of Wound Vac to the Left Thigh and Scrotum.;  Surgeon: Roby Lofts, MD;  [facility];  Service: Orthopedics;;  . APPLICATION OF WOUND VAC  07/10/2018   Procedure: Application Of Wound Vac;  Surgeon: Berna Bue, MD;  [facility];  Service: General;;  . COLON SURGERY  2020   colostomy  . COLOSTOMY N/A 07/23/2018   Procedure: COLOSTOMY;  Surgeon: Violeta Gelinas, MD;  Location: [facility] OR;  Service: General;  Laterality: N/A;  . CYSTOSCOPY W/ URETERAL STENT PLACEMENT N/A 07/15/2018   Procedure: RETROGRADE URETHROGRAM;  Surgeon: Marcine Matar, MD;  Location: [facility] OR;  Service: Urology;  Laterality: N/A;  . CYSTOSCOPY WITH LITHOLAPAXY N/A 05/06/2019   Procedure: CYSTOSCOPY BASKET BLADDER STONE EXTRACTION;  Surgeon: Malen Gauze, MD;  Location: [facility];  Service: Urology;  Laterality: N/A;  30 MINS  . CYSTOSTOMY N/A 05/06/2019   Procedure: REPLACEMENT OF SUPRAPUBIC CATHETER;  Surgeon: Malen Gauze, MD;  Location: [facility];  Service: Urology;  Laterality: N/A;  . DEBRIDEMENT AND CLOSURE WOUND Left 03/04/2019   Procedure: Excision of hip wound with placement of Acell;  Surgeon: Peggye Form, DO;  [facility];  Service: Plastics;  Laterality: Left;  . ESOPHAGOGASTRODUODENOSCOPY N/A 08/14/2018   Procedure: ESOPHAGOGASTRODUODENOSCOPY (EGD);  Surgeon: Violeta Gelinas, MD;  Location: [facility] ENDOSCOPY;  Service: General;  Laterality: N/A;  bedside  . FACIAL RECONSTRUCTION SURGERY     X 2--once as a teenager and second time in his 80's  . HARDWARE REMOVAL Left 03/04/2019   Procedure: Left Hip Hardware Removal;  Surgeon: Roby Lofts, MD;  [facility];  Service: Orthopedics;  Laterality: Left;  . HIP PINNING,CANNULATED Left 07/12/2018   Procedure: CANNULATED HIP PINNING;  Surgeon: Roby Lofts, MD;  [facility];  Service: Orthopedics;  Laterality: Left;  . HIP SURGERY    . HOLMIUM LASER APPLICATION Right 07/15/2019   Procedure: HOLMIUM LASER APPLICATION;  Surgeon: Malen Gauze, MD;  Location: [facility];  Service: Urology;  Laterality: Right;  . I & D EXTREMITY Left 07/25/2018   Procedure: Debridement of buttock, scrotum and  left leg, placement of acell and vac;  Surgeon: Foster Simpson  S, DO;  [facility];  Service: Plastics;  Laterality: Left;  . I & D EXTREMITY N/A 08/06/2018   Procedure: Debridement of buttock, scrotum and left leg;  Surgeon: Peggye Form, DO;  [facility];  Service: Plastics;  Laterality: N/A;  . I & D EXTREMITY N/A 08/13/2018   Procedure: Debridement of buttock, scrotum and left leg, placement of acell and vac;  Surgeon: Peggye Form, DO;  [facility];  Service: Plastics;  Laterality: N/A;  90 min, please  . INCISION AND DRAINAGE HIP Left 09/18/2019   Procedure: IRRIGATION AND DEBRIDEMENT HIP/ PELVIS WITH WOUND VAC PLACEMENT;  Surgeon: Roby Lofts, MD;  [facility];  Service: Orthopedics;  Laterality: Left;  . INCISION AND DRAINAGE OF WOUND N/A 07/18/2018   Procedure: Debridement of left leg, buttocks and scrotal wound with placement of acell and Flexiseal;  Surgeon: Peggye Form, DO;  [facility];  Service: Plastics;  Laterality: N/A;  . INCISION AND DRAINAGE OF WOUND Left 08/29/2018   Procedure: Debridement of buttock, scrotum and left leg, placement of acell and vac;  Surgeon: Peggye Form, DO;  [facility];  Service: Plastics;  Laterality: Left;  75 min, please  . INCISION AND DRAINAGE OF WOUND Bilateral 10/23/2018   Procedure: DEBRIDEMENT OF BUTTOCK,SCROTUM, AND LEG WOUNDS WITH PLACEMENT OF ACELL- BILATERAL 90 MIN;  Surgeon: Peggye Form, DO;  [facility];  Service: Plastics;  Laterality: Bilateral;  . IR ANGIOGRAM PELVIS SELECTIVE OR SUPRASELECTIVE  07/10/2018  . IR ANGIOGRAM PELVIS SELECTIVE OR SUPRASELECTIVE  07/10/2018  . IR ANGIOGRAM SELECTIVE EACH ADDITIONAL VESSEL  07/10/2018  . IR EMBO ART  VEN HEMORR LYMPH EXTRAV  INC GUIDE ROADMAPPING  07/10/2018  . IR NEPHROSTOMY PLACEMENT LEFT  04/05/2019  . IR NEPHROSTOMY PLACEMENT RIGHT  05/31/2019  . IR US GUIDE BX ASP/DRAIN  07/10/2018  . IR US GUIDE VASC ACCESS RIGHT  07/10/2018   . IR VENO/EXT/UNI LEFT  07/10/2018  . IRRIGATION AND DEBRIDEMENT OF WOUND WITH SPLIT THICKNESS SKIN GRAFT Left 09/19/2018   Procedure: Debridement of gluteal wound with placement of acell to left leg.;  Surgeon: Peggye Form, DO;  [facility];  Service: Plastics;  Laterality: Left;  2.5 hours, please  . LAPAROTOMY N/A 07/12/2018   Procedure: EXPLORATORY LAPAROTOMY;  Surgeon: Violeta Gelinas, MD;  Location: [facility] OR;  Service: General;  Laterality: N/A;  . LAPAROTOMY N/A 07/15/2018   Procedure: WOUND EXPLORATION; CLOSURE OF ABDOMEN;  Surgeon: Violeta Gelinas, MD;  Location: [facility] OR;  Service: General;  Laterality: N/A;  . LAPAROTOMY  07/10/2018   Procedure: Exploratory Laparotomy;  Surgeon: Berna Bue, MD;  Location: [facility] OR;  Service: General;;  . MASS EXCISION Left 09/18/2019   Procedure: EXCISION UPPER LEFT INNER THIGH WOUND;  Surgeon: Peggye Form, DO;  [facility];  Service: Plastics;  Laterality: Left;  . NEPHROLITHOTOMY Right 07/15/2019   Procedure: NEPHROLITHOTOMY PERCUTANEOUS;  Surgeon: Malen Gauze, MD;  Location: [facility];  Service: Urology;  Laterality: Right;  90 MINS  . PEG PLACEMENT N/A 08/14/2018   Procedure: PERCUTANEOUS ENDOSCOPIC GASTROSTOMY (PEG) PLACEMENT;  Surgeon: Violeta Gelinas, MD;  Location: [facility]COPY;  Service: General;  Laterality: N/A;  . PERCUTANEOUS TRACHEOSTOMY N/A 08/02/2018   Procedure: PERCUTANEOUS TRACHEOSTOMY;  Surgeon: Violeta Gelinas, MD;  Location: [facility] OR;  Service: General;  Laterality: N/A;  . RADIOLOGY WITH ANESTHESIA N/A 07/10/2018   Procedure: IR WITH ANESTHESIA;  Surgeon: Simonne Come, MD;  [facility];  Service: Radiology;  Laterality: N/A;  . RADIOLOGY WITH ANESTHESIA Right 07/10/2018   Procedure: Ir With Anesthesia;  Surgeon: Simonne Come, MD;  Location: [facility] OR;  Service: Radiology;  Laterality: Right;  . SCROTAL EXPLORATION N/A 07/15/2018   Procedure: SCROTUM DEBRIDEMENT;  Surgeon: Marcine Matar, MD;   Location: [facility] OR;  Service: Urology;  Laterality: N/A;  . SHOULDER SURGERY    . SKIN SPLIT GRAFT Right 09/19/2018   Procedure: Skin Graft Split Thickness;  Surgeon: Peggye Form, DO;  [facility];  Service: Plastics;  Laterality: Right;  . SKIN SPLIT GRAFT N/A 10/03/2018   Procedure: Split thickness skin graft to gluteal area with acell placement;  Surgeon: Peggye Form, DO;  [facility];  Service: Plastics;  Laterality: N/A;  3 hours, please  . VACUUM ASSISTED CLOSURE CHANGE N/A 07/12/2018   Procedure: ABDOMINAL VACUUM ASSISTED CLOSURE CHANGE and abdominal washout;  Surgeon: Violeta Gelinas, MD;  Location: [facility] OR;  Service: General;  Laterality: N/A;  . WOUND DEBRIDEMENT Left 07/23/2018   Procedure: DEBRIDEMENT LEFT BUTTOCK  WOUND;  Surgeon: Violeta Gelinas, MD;  Location: [facility] OR;  Service: General;  Laterality: Left;  . WOUND EXPLORATION Left 07/10/2018   Procedure: WOUND EXPLORATION LEFT GROIN;  Surgeon: Berna Bue, MD;  Location: [facility] OR;  Service: General;  Laterality: Left;   Past Medical History:  Diagnosis Date  . Acute on chronic respiratory failure with hypoxia (HCC) 06/2018   trach removed 11-16-2018, on vent from jan until may 2020 - uses albuterol prn  . Anxiety   . Bacteremia due to Pseudomonas 06/2018  . Chronic osteomyelitis (HCC)   . Chronic pain syndrome   . Clostridium difficile colitis 10/30/2019   tx with abx   . Depression   . DVT (deep venous thrombosis) (HCC) 2020   right brachial post PICC line  . History of blood transfusion 06/2018  . History of kidney stones   . Hypertension    norvasc d/c by pcp on 11/05/19  . Multiple traumatic injuries   . Penile pain 11/18/2019  . Pneumonia 11/2009   2020 x 2  . Dan Humphreys as ambulation aid   . Wheelchair bound    electric  . Wound discharge    left hip wound with bloody/clear drainage change dressing q day surgilube with gauze, between legs wound using calcium algenate pad bid   BP 122/87   Pulse 86    Temp 97.9 F (36.6 C)   SpO2 97%   Opioid Risk Score:   Fall Risk Score:  `1  Depression screen PHQ 2/9  Depression screen [facility] 2/9 03/17/2020 12/30/2019 12/18/2019 04/10/2019  Decreased Interest 0 0 0 0  Down, Depressed, Hopeless 1 0 0 0  PHQ - 2 Score 1 0 0 0  Some recent data might be hidden      Review of Systems An entire ROS was completed and found to be negative except HPI.     Objective:   Physical Exam Awake, alert, appropriate, interactive, but writhing in pain still, NAD  No hoffman's, no clonus No signs of twitching.           Assessment & Plan:    Pt is a 54 yr old male with hx of Multitrauma- causing L femoral neck fx, degloving of L hip to going/scrotum, bladder neck trauma- got SPC, developed compartment syndrome -s/p surgery for that; also diverting colostomy, skin grafts, and s/p trachand PEG- PEG is out. Also has moderate  protein-calorie malnutrition, anxiety due to multitrauma, and chronic pain.S/P screw removal and on IV ABX for L hip osteomyelitis. 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 B/L tennis elbow new-and B/L pending/forming ulnar neuropathy Osteomyelitis of L hip found again-on  PO vanc and Cipro  1. NOW- Will add low dose methadone-  5mg  2x/day. Watch for confusion. Have to call pharmacy to try and get them to do this- and d/w     2. IN ONE WEEK- increase the Percocet to 10 mg/325 q3 hours as needed.  Try to do at q4 hours if possible.   3. Need to Wean Quetiapine to 25 mg nightly x 1 week, then see if can stop it, due to QT prolongation with Methadone.  On for sleep.   4. Will replace Quetiapine with Trazodone - 50 mg nightly- but can increase to 200 mg nightly. Will send in 100 mg tablet.   5. If this regimen works better, will NEED to get up.  6. If Methadone helps some, can switch slowly over 1-2 months to Methadone from Duragesic patch- could also  help poor memory/clognitive slowing issues that's he's experiencing.   7. F/u in 6 weeks- double appointment.    I spent a total of 45 minutes on appointment- as detailed above.

## 2020-06-16 ENCOUNTER — Other Ambulatory Visit: Payer: Self-pay | Admitting: Physical Medicine and Rehabilitation

## 2020-06-18 ENCOUNTER — Other Ambulatory Visit: Payer: Self-pay | Admitting: Family

## 2020-06-18 ENCOUNTER — Telehealth: Payer: Self-pay | Admitting: *Deleted

## 2020-06-18 ENCOUNTER — Ambulatory Visit: Payer: Self-pay | Admitting: Surgical

## 2020-06-18 NOTE — Telephone Encounter (Signed)
Mrs Putzier called about Christopher [name]'s methadone.  She has not really seen a difference in his pain relief and would like to speak with Dr Berline Chough.  Her # is [phone].

## 2020-06-19 ENCOUNTER — Other Ambulatory Visit: Payer: Self-pay

## 2020-06-19 ENCOUNTER — Emergency Department (HOSPITAL_COMMUNITY): Payer: No Typology Code available for payment source

## 2020-06-19 ENCOUNTER — Inpatient Hospital Stay (HOSPITAL_COMMUNITY)
Admission: EM | Admit: 2020-06-19 | Discharge: 2020-06-25 | DRG: 871 | Disposition: A | Discharge status: Home / Self Care | Payer: No Typology Code available for payment source | Attending: Internal Medicine | Admitting: Internal Medicine

## 2020-06-19 DIAGNOSIS — J189 Pneumonia, unspecified organism: Secondary | ICD-10-CM | POA: Diagnosis not present

## 2020-06-19 DIAGNOSIS — J9601 Acute respiratory failure with hypoxia: Secondary | ICD-10-CM | POA: Diagnosis present

## 2020-06-19 DIAGNOSIS — N390 Urinary tract infection, site not specified: Secondary | ICD-10-CM | POA: Diagnosis not present

## 2020-06-19 DIAGNOSIS — A419 Sepsis, unspecified organism: Principal | ICD-10-CM | POA: Diagnosis present

## 2020-06-19 DIAGNOSIS — J9621 Acute and chronic respiratory failure with hypoxia: Secondary | ICD-10-CM | POA: Diagnosis present

## 2020-06-19 DIAGNOSIS — G894 Chronic pain syndrome: Secondary | ICD-10-CM | POA: Diagnosis present

## 2020-06-19 DIAGNOSIS — Z9359 Other cystostomy status: Secondary | ICD-10-CM

## 2020-06-19 DIAGNOSIS — F419 Anxiety disorder, unspecified: Secondary | ICD-10-CM | POA: Diagnosis present

## 2020-06-19 DIAGNOSIS — Z993 Dependence on wheelchair: Secondary | ICD-10-CM

## 2020-06-19 DIAGNOSIS — Z20822 Contact with and (suspected) exposure to covid-19: Secondary | ICD-10-CM | POA: Diagnosis present

## 2020-06-19 DIAGNOSIS — Z87891 Personal history of nicotine dependence: Secondary | ICD-10-CM

## 2020-06-19 DIAGNOSIS — F329 Major depressive disorder, single episode, unspecified: Secondary | ICD-10-CM | POA: Diagnosis present

## 2020-06-19 DIAGNOSIS — Z803 Family history of malignant neoplasm of breast: Secondary | ICD-10-CM

## 2020-06-19 DIAGNOSIS — Z6822 Body mass index (BMI) 22.0-22.9, adult: Secondary | ICD-10-CM

## 2020-06-19 DIAGNOSIS — E876 Hypokalemia: Secondary | ICD-10-CM | POA: Diagnosis present

## 2020-06-19 DIAGNOSIS — F1120 Opioid dependence, uncomplicated: Secondary | ICD-10-CM | POA: Diagnosis present

## 2020-06-19 DIAGNOSIS — R1310 Dysphagia, unspecified: Secondary | ICD-10-CM | POA: Diagnosis present

## 2020-06-19 DIAGNOSIS — M86652 Other chronic osteomyelitis, left thigh: Secondary | ICD-10-CM | POA: Diagnosis present

## 2020-06-19 DIAGNOSIS — Z881 Allergy status to other antibiotic agents status: Secondary | ICD-10-CM

## 2020-06-19 DIAGNOSIS — K2289 Other specified disease of esophagus: Secondary | ICD-10-CM | POA: Diagnosis present

## 2020-06-19 DIAGNOSIS — Z933 Colostomy status: Secondary | ICD-10-CM

## 2020-06-19 DIAGNOSIS — Z79899 Other long term (current) drug therapy: Secondary | ICD-10-CM

## 2020-06-19 DIAGNOSIS — K219 Gastro-esophageal reflux disease without esophagitis: Secondary | ICD-10-CM | POA: Diagnosis present

## 2020-06-19 DIAGNOSIS — Z87442 Personal history of urinary calculi: Secondary | ICD-10-CM

## 2020-06-19 DIAGNOSIS — M25559 Pain in unspecified hip: Secondary | ICD-10-CM | POA: Diagnosis present

## 2020-06-19 DIAGNOSIS — Z228 Carrier of other infectious diseases: Secondary | ICD-10-CM

## 2020-06-19 DIAGNOSIS — G8220 Paraplegia, unspecified: Secondary | ICD-10-CM | POA: Diagnosis present

## 2020-06-19 DIAGNOSIS — E440 Moderate protein-calorie malnutrition: Secondary | ICD-10-CM | POA: Diagnosis present

## 2020-06-19 DIAGNOSIS — R7989 Other specified abnormal findings of blood chemistry: Secondary | ICD-10-CM | POA: Diagnosis present

## 2020-06-19 DIAGNOSIS — Z86718 Personal history of other venous thrombosis and embolism: Secondary | ICD-10-CM

## 2020-06-19 LAB — CBC WITH DIFFERENTIAL/PLATELET
Abs Immature Granulocytes: 0.05 10*3/uL (ref 0.00–0.07)
Basophils Absolute: 0 10*3/uL (ref 0.0–0.1)
Basophils Relative: 0 %
Eosinophils Absolute: 0.2 10*3/uL (ref 0.0–0.5)
Eosinophils Relative: 2 %
HCT: 39.3 % (ref 39.0–52.0)
Hemoglobin: 12.3 g/dL — ABNORMAL LOW (ref 13.0–17.0)
Immature Granulocytes: 0 %
Lymphocytes Relative: 4 %
Lymphs Abs: 0.4 10*3/uL — ABNORMAL LOW (ref 0.7–4.0)
MCH: 30.8 pg (ref 26.0–34.0)
MCHC: 31.3 g/dL (ref 30.0–36.0)
MCV: 98.3 fL (ref 80.0–100.0)
Monocytes Absolute: 0.5 10*3/uL (ref 0.1–1.0)
Monocytes Relative: 4 %
Neutro Abs: 10.3 10*3/uL — ABNORMAL HIGH (ref 1.7–7.7)
Neutrophils Relative %: 90 %
Platelets: 204 10*3/uL (ref 150–400)
RBC: 4 MIL/uL — ABNORMAL LOW (ref 4.22–5.81)
RDW: 13.1 % (ref 11.5–15.5)
WBC: 11.5 10*3/uL — ABNORMAL HIGH (ref 4.0–10.5)
nRBC: 0 % (ref 0.0–0.2)

## 2020-06-19 LAB — COMPREHENSIVE METABOLIC PANEL
ALT: 71 U/L — ABNORMAL HIGH (ref 0–44)
AST: 36 U/L (ref 15–41)
Albumin: 3.2 g/dL — ABNORMAL LOW (ref 3.5–5.0)
Alkaline Phosphatase: 159 U/L — ABNORMAL HIGH (ref 38–126)
Anion gap: 11 (ref 5–15)
BUN: 13 mg/dL (ref 6–20)
CO2: 23 mmol/L (ref 22–32)
Calcium: 9.5 mg/dL (ref 8.9–10.3)
Chloride: 100 mmol/L (ref 98–111)
Creatinine, Ser: 0.68 mg/dL (ref 0.61–1.24)
GFR, Estimated: >60 mL/min (ref >60)
Glucose, Bld: 128 mg/dL — ABNORMAL HIGH (ref 70–99)
Potassium: 3.6 mmol/L (ref 3.5–5.1)
Sodium: 134 mmol/L — ABNORMAL LOW (ref 135–145)
Total Bilirubin: 0.9 mg/dL (ref 0.3–1.2)
Total Protein: 7 g/dL (ref 6.5–8.1)

## 2020-06-19 LAB — BRAIN NATRIURETIC PEPTIDE: B Natriuretic Peptide: 87.5 pg/mL (ref 0.0–100.0)

## 2020-06-19 LAB — LACTATE DEHYDROGENASE: LDH: 141 U/L (ref 98–192)

## 2020-06-19 LAB — FIBRINOGEN: Fibrinogen: 653 mg/dL — ABNORMAL HIGH (ref 210–475)

## 2020-06-19 LAB — TYPE AND SCREEN
ABO/RH(D): O POS
Antibody Screen: NEGATIVE

## 2020-06-19 LAB — TROPONIN I (HIGH SENSITIVITY): Troponin I (High Sensitivity): 4 ng/L (ref <18)

## 2020-06-19 LAB — D-DIMER, QUANTITATIVE: D-Dimer, Quant: 1.05 ug/mL-FEU — ABNORMAL HIGH (ref 0.00–0.50)

## 2020-06-19 NOTE — ED Provider Notes (Signed)
[facility] EMERGENCY DEPARTMENT Provider Note   CSN: [id number] Arrival date & time: 06/19/20  2222     History Chief Complaint  Patient presents with  . Shortness of Breath    Christopher [name] is a 55 y.o. male.  Patient with PMH of paraplegia 2/2 to trauma, chronic hip osteomyelitis on cipro and oral vanc, presents to the ED with a chief complain of SOB and productive cough.  He states that he began having trouble breathing this morning.  She has had increased coughing.  He is vaccinated without booster.  He denies fever, but is noted to have temp of 100.8 in triage.  EMS found him to also be hypoxic to 90% on RA.  Patient does have history of DVT, but states that he was taken off his eliquis a while ago, he is unable to specify when.  He does also complain of some chronic hip pain.  He states that his throat feels sore as well.  The history is provided by the patient. No language interpreter was used.       Past Medical History:  Diagnosis Date  . Acute on chronic respiratory failure with hypoxia ([place]) 06/2018   trach removed 11-16-2018, on vent from jan until may 2020 - uses albuterol prn  . Anxiety   . Bacteremia due to Pseudomonas 06/2018  . Chronic osteomyelitis ([place])   . Chronic pain syndrome   . Clostridium difficile colitis 10/30/2019   tx with abx   . Depression   . DVT (deep venous thrombosis) ([place]) 2020   right brachial post PICC line  . History of blood transfusion 06/2018  . History of kidney stones   . Hypertension    norvasc d/c by pcp on 11/05/19  . Multiple traumatic injuries   . Penile pain 11/18/2019  . Pneumonia 11/2009   2020 x 2  . Gilford Rile as ambulation aid   . Wheelchair bound    electric  . Wound discharge    left hip wound with bloody/clear drainage change dressing q day surgilube with gauze, between legs wound using calcium algenate pad bid    Patient Active Problem List   Diagnosis Date Noted  . Hypertensive urgency  04/11/2020  . Therapeutic opioid induced constipation 04/11/2020  . Abdominal pain 04/09/2020  . Status post peripherally inserted central catheter (PICC) central line placement 03/17/2020  . Skin-picking disorder 03/12/2020  . CHF (congestive heart failure) ([place]) 01/27/2020  . Bone infection of pelvis ([place]) 12/12/2019  . Recurrent pain of right knee 11/08/2019  . Elevated blood pressure reading 11/05/2019  . Acute osteomyelitis of pelvic region and thigh ([place])   . Acute deep vein thrombosis (DVT) of brachial vein of right upper extremity ([place]) 11/01/2019  . Myofascial pain dysfunction syndrome 08/05/2019  . Protein-calorie malnutrition, severe 06/01/2019  . Chronic osteomyelitis of pelvis, left ([place]) 03/07/2019  . Wound infection, posttraumatic 02/28/2019  . Hydronephrosis concurrent with and due to calculi of kidney and ureter 02/28/2019  . GERD (gastroesophageal reflux disease) 02/28/2019  . Suprapubic catheter ([place]) 02/28/2019  . Protein-calorie malnutrition, moderate ([place]) 02/28/2019  . 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 Reactive depression 01/29/2019  . Chronic pain due to trauma 01/29/2019  . Neuropathic pain 01/29/2019  . Colostomy status ([place]) 01/14/2019  . Insomnia 01/14/2019  . Tachycardia 01/14/2019  . Current use of proton pump inhibitor 01/14/2019  . Wound of buttock 12/28/2018  . Anxiety and depression 12/17/2018  . Debility 11/23/2018  .  Chronic pain syndrome   . Multiple traumatic injuries   . Pressure injury of skin 08/13/2018  . Urethral injury 07/13/2018    Past Surgical History:  Procedure Laterality Date  . APPLICATION OF A-CELL OF BACK N/A 08/06/2018   Procedure: Application Of A-Cell Of Back;  Surgeon: [name], DO;  Location: [place];  Service: Plastics;  Laterality: N/A;  . APPLICATION OF A-CELL OF EXTREMITY Left 08/06/2018   Procedure: Application Of A-Cell Of Extremity;  Surgeon: [name], DO;   Location: [place];  Service: Plastics;  Laterality: Left;  . APPLICATION OF A-CELL OF EXTREMITY Left 09/18/2019   Procedure: APPLICATION OF A-CELL OF EXTREMITY;  Surgeon: [name], DO;  Location: [place];  Service: Plastics;  Laterality: Left;  . APPLICATION OF WOUND VAC  07/12/2018   Procedure: Application Of Wound Vac to the Left Thigh and Scrotum.;  Surgeon: Shona Needles, MD;  Location: [place];  Service: Orthopedics;;  . APPLICATION OF WOUND VAC  07/10/2018   Procedure: Application Of Wound Vac;  Surgeon: Clovis Riley, MD;  Location: [place];  Service: General;;  . COLON SURGERY  2020   colostomy  . COLOSTOMY N/A 07/23/2018   Procedure: COLOSTOMY;  Surgeon: Georganna Skeans, MD;  Location: [place];  Service: General;  Laterality: N/A;  . CYSTOSCOPY W/ URETERAL STENT PLACEMENT N/A 07/15/2018   Procedure: RETROGRADE URETHROGRAM;  Surgeon: Franchot Gallo, MD;  Location: [place];  Service: Urology;  Laterality: N/A;  . CYSTOSCOPY WITH LITHOLAPAXY N/A 05/06/2019   Procedure: CYSTOSCOPY BASKET BLADDER STONE EXTRACTION;  Surgeon: Cleon Gustin, MD;  Location: [facility];  Service: Urology;  Laterality: N/A;  30 MINS  . CYSTOSTOMY N/A 05/06/2019   Procedure: REPLACEMENT OF SUPRAPUBIC CATHETER;  Surgeon: Cleon Gustin, MD;  Location: [facility];  Service: Urology;  Laterality: N/A;  . DEBRIDEMENT AND CLOSURE WOUND Left 03/04/2019   Procedure: Excision of hip wound with placement of A[place];  Surgeon: [name], DO;  Location: [place];  Service: Plastics;  Laterality: Left;  . ESOPHAGOGASTRODUODENOSCOPY N/A 08/14/2018   Procedure: ESOPHAGOGASTRODUODENOSCOPY (EGD);  Surgeon: Georganna Skeans, MD;  Location: [place];  Service: General;  Laterality: N/A;  bedside  . FACIAL RECONSTRUCTION SURGERY     X 2--once as a teenager and second time in his 68's  . HARDWARE REMOVAL Left 03/04/2019   Procedure: Left Hip Hardware Removal;  Surgeon: Shona Needles, MD;  Location: [place];  Service: Orthopedics;  Laterality: Left;  . HIP PINNING,CANNULATED Left 07/12/2018   Procedure: CANNULATED HIP PINNING;  Surgeon: Shona Needles, MD;  Location: [place];  Service: Orthopedics;  Laterality: Left;  . HIP SURGERY    . HOLMIUM LASER APPLICATION Right 1/0/0712   Procedure: HOLMIUM LASER APPLICATION;  Surgeon: Cleon Gustin, MD;  Location: [facility];  Service: Urology;  Laterality: Right;  . I & D EXTREMITY Left 07/25/2018   Procedure: Debridement of buttock, scrotum and left leg, placement of acell and vac;  Surgeon: [name], DO;  Location: [place];  Service: Plastics;  Laterality: Left;  . I & D EXTREMITY N/A 08/06/2018   Procedure: Debridement of buttock, scrotum and left leg;  Surgeon: [name], DO;  Location: [place];  Service: Plastics;  Laterality: N/A;  . I & D EXTREMITY N/A 08/13/2018   Procedure: Debridement of buttock, scrotum and left leg, placement of acell and vac;  Surgeon: [name],  DO;  Location: [place];  Service: Plastics;  Laterality: N/A;  90 min, please  . INCISION AND DRAINAGE HIP Left 09/18/2019   Procedure: IRRIGATION AND DEBRIDEMENT HIP/ PELVIS WITH WOUND VAC PLACEMENT;  Surgeon: Shona Needles, MD;  Location: [place];  Service: Orthopedics;  Laterality: Left;  . INCISION AND DRAINAGE OF WOUND N/A 07/18/2018   Procedure: Debridement of left leg, buttocks and scrotal wound with placement of acell and Flexiseal;  Surgeon: [name], DO;  Location: [place];  Service: Plastics;  Laterality: N/A;  . INCISION AND DRAINAGE OF WOUND Left 08/29/2018   Procedure: Debridement of buttock, scrotum and left leg, placement of acell and vac;  Surgeon: [name], DO;  Location: [place];  Service: Plastics;  Laterality: Left;  75 min, please  . INCISION AND DRAINAGE OF WOUND Bilateral 10/23/2018   Procedure: DEBRIDEMENT OF BUTTOCK,SCROTUM, AND LEG WOUNDS WITH PLACEMENT OF ACELL- BILATERAL  90 MIN;  Surgeon: [name], DO;  Location: [place];  Service: Plastics;  Laterality: Bilateral;  . IR ANGIOGRAM PELVIS SELECTIVE OR SUPRASELECTIVE  07/10/2018  . IR ANGIOGRAM PELVIS SELECTIVE OR SUPRASELECTIVE  07/10/2018  . IR ANGIOGRAM SELECTIVE EACH ADDITIONAL VESSEL  07/10/2018  . IR EMBO ART  VEN HEMORR LYMPH EXTRAV  INC GUIDE ROADMAPPING  07/10/2018  . IR NEPHROSTOMY PLACEMENT LEFT  04/05/2019  . IR NEPHROSTOMY PLACEMENT RIGHT  05/31/2019  . IR US GUIDE BX ASP/DRAIN  07/10/2018  . IR US GUIDE VASC ACCESS RIGHT  07/10/2018  . IR VENO/EXT/UNI LEFT  07/10/2018  . IRRIGATION AND DEBRIDEMENT OF WOUND WITH SPLIT THICKNESS SKIN GRAFT Left 09/19/2018   Procedure: Debridement of gluteal wound with placement of acell to left leg.;  Surgeon: [name], DO;  Location: [place];  Service: Plastics;  Laterality: Left;  2.5 hours, please  . LAPAROTOMY N/A 07/12/2018   Procedure: EXPLORATORY LAPAROTOMY;  Surgeon: Georganna Skeans, MD;  Location: [place];  Service: General;  Laterality: N/A;  . LAPAROTOMY N/A 07/15/2018   Procedure: WOUND EXPLORATION; CLOSURE OF ABDOMEN;  Surgeon: Georganna Skeans, MD;  Location: [place];  Service: General;  Laterality: N/A;  . LAPAROTOMY  07/10/2018   Procedure: Exploratory Laparotomy;  Surgeon: Clovis Riley, MD;  Location: [place];  Service: General;;  . MASS EXCISION Left 09/18/2019   Procedure: EXCISION UPPER LEFT INNER THIGH WOUND;  Surgeon: [name], DO;  Location: [place];  Service: Plastics;  Laterality: Left;  . NEPHROLITHOTOMY Right 07/15/2019   Procedure: NEPHROLITHOTOMY PERCUTANEOUS;  Surgeon: Cleon Gustin, MD;  Location: [facility];  Service: Urology;  Laterality: Right;  90 MINS  . PEG PLACEMENT N/A 08/14/2018   Procedure: PERCUTANEOUS ENDOSCOPIC GASTROSTOMY (PEG) PLACEMENT;  Surgeon: Georganna Skeans, MD;  Location: [place];  Service: General;  Laterality: N/A;  . PERCUTANEOUS TRACHEOSTOMY N/A 08/02/2018   Procedure:  PERCUTANEOUS TRACHEOSTOMY;  Surgeon: Georganna Skeans, MD;  Location: [place];  Service: General;  Laterality: N/A;  . RADIOLOGY WITH ANESTHESIA N/A 07/10/2018   Procedure: IR WITH ANESTHESIA;  Surgeon: Sandi Mariscal, MD;  Location: [place];  Service: Radiology;  Laterality: N/A;  . RADIOLOGY WITH ANESTHESIA Right 07/10/2018   Procedure: Ir With Anesthesia;  Surgeon: Sandi Mariscal, MD;  Location: [place];  Service: Radiology;  Laterality: Right;  . SCROTAL EXPLORATION N/A 07/15/2018   Procedure: SCROTUM DEBRIDEMENT;  Surgeon: Franchot Gallo, MD;  Location: [place];  Service: Urology;  Laterality: N/A;  . SHOULDER SURGERY    . SKIN  SPLIT GRAFT Right 09/19/2018   Procedure: Skin Graft Split Thickness;  Surgeon: [name], DO;  Location: [place];  Service: Plastics;  Laterality: Right;  . SKIN SPLIT GRAFT N/A 10/03/2018   Procedure: Split thickness skin graft to gluteal area with acell placement;  Surgeon: [name], DO;  Location: [place];  Service: Plastics;  Laterality: N/A;  3 hours, please  . VACUUM ASSISTED CLOSURE CHANGE N/A 07/12/2018   Procedure: ABDOMINAL VACUUM ASSISTED CLOSURE CHANGE and abdominal washout;  Surgeon: Georganna Skeans, MD;  Location: [place];  Service: General;  Laterality: N/A;  . WOUND DEBRIDEMENT Left 07/23/2018   Procedure: DEBRIDEMENT LEFT BUTTOCK  WOUND;  Surgeon: Georganna Skeans, MD;  Location: [place];  Service: General;  Laterality: Left;  . WOUND EXPLORATION Left 07/10/2018   Procedure: WOUND EXPLORATION LEFT GROIN;  Surgeon: Clovis Riley, MD;  Location: [facility];  Service: General;  Laterality: Left;       Family History  Problem Relation Age of Onset  . Breast cancer Mother        with mets to the bones    Social History   Tobacco Use  . Smoking status: Former Smoker    Packs/day: 1.00    Years: 20.00    Pack years: 20.00    Types: Cigarettes    Quit date: 07/10/2018    Years since quitting: 1.9  . Smokeless tobacco: Never Used  Vaping Use  .  Vaping Use: Never used  Substance Use Topics  . Alcohol use: Never  . Drug use: Yes    Types: Oxycodone, Fentanyl    Comment: Fentanyl patch/oxycodone since 06/2018    Home Medications Prior to Admission medications   Medication Sig Start Date End Date Taking? Authorizing Provider  albuterol (VENTOLIN HFA) 108 (90 Base) MCG/ACT inhaler TAKE 2 PUFFS BY MOUTH EVERY 6 HOURS AS NEEDED FOR WHEEZE OR SHORTNESS OF BREATH 05/25/20   Vivi Barrack, MD  Amino Acids-Protein Hydrolys (FEEDING SUPPLEMENT, PRO-STAT SUGAR FREE 64,) LIQD Take 30 mLs by mouth 3 (three) times daily with meals. Patient taking differently: Take 30 mLs by mouth 2 (two) times daily. 03/26/19   Vivi Barrack, MD  amLODipine (NORVASC) 10 MG tablet Take 1 tablet (10 mg total) by mouth daily. 04/12/20   Mariel Aloe, MD  Blood Pressure Monitoring (BLOOD PRESSURE CUFF) MISC Use daily as needed to check blood pressure. 11/05/19   Vivi Barrack, MD  ciprofloxacin (CIPRO) 500 MG tablet Take 1 tablet (500 mg total) by mouth 2 (two) times daily. 04/20/20   Golden Circle, FNP  clonazePAM (KLONOPIN) 0.5 MG tablet Take 0.5 tablets (0.25 mg total) by mouth 3 (three) times daily as needed for anxiety. 02/25/20   Raulkar, Clide Deutscher, MD  dextromethorphan-guaiFENesin (MUCINEX DM) 30-600 MG 12hr tablet Take 2 tablets by mouth 2 (two) times daily.    [provider]  dicyclomine (BENTYL) 20 MG tablet Take 1 tablet (20 mg total) by mouth 3 (three) times daily as needed for spasms. 05/18/20   Golden Circle, FNP  dronabinol (MARINOL) 2.5 MG capsule TAKE 1 CAPSULE (2.5 MG TOTAL) BY MOUTH 2 (TWO) TIMES DAILY BEFORE A MEAL. Patient taking differently: Take 2.5 mg by mouth 2 (two) times daily as needed (to encourage appetite). 07/29/19   Lovorn, Jinny Blossom, MD  ELIQUIS 5 MG TABS tablet TAKE 1 TABLET BY MOUTH TWICE A DAY 05/04/20   Vivi Barrack, MD  fentaNYL (DURAGESIC) 75 MCG/HR Place 1 patch  onto the skin every other day. 06/02/20    Lovorn, Jinny Blossom, MD  gabapentin (NEURONTIN) 300 MG capsule Take 1 capsule (300 mg total) by mouth 3 (three) times daily. 05/25/20   Lovorn, Jinny Blossom, MD  lidocaine (LIDODERM) 5 % PLACE 3 PATCHES ONTO THE SKIN EVERY 12 HOURS. REMOVE & DISCARD PATCH WITHIN 12 HOURS OR AS DIRECTED BY MD 06/16/20   Courtney Heys, MD  melatonin ([employer] MELATONIN) 3 MG TABS tablet Take 1 tablet (3 mg total) by mouth at bedtime. 11/01/19   Matcha, Beverely Pace, MD  methadone (DOLOPHINE) 5 MG tablet Take 1 tablet (5 mg total) by mouth every 12 (twelve) hours. 06/10/20   Lovorn, Jinny Blossom, MD  methocarbamol (ROBAXIN) 500 MG tablet TAKE 1 TABLET BY MOUTH EVERY 6 HOURS AS NEEDED FOR MUSCLE SPASMS Patient taking differently: Take 500 mg by mouth 4 (four) times daily. 11/06/19   Lovorn, Jinny Blossom, MD  mirabegron ER (MYRBETRIQ) 50 MG TB24 tablet Take 50 mg by mouth daily.    [provider]  Multiple Vitamin (MULTIVITAMIN WITH MINERALS) TABS tablet Take 1 tablet by mouth daily. 03/13/19   Kinnie Feil, MD  naloxegol oxalate (MOVANTIK) 12.5 MG TABS tablet Take 1 tablet (12.5 mg total) by mouth daily. 04/27/20   Golden Circle, FNP  naloxone [facility]) nasal spray 4 mg/0.1 mL To use if pt develops unconsciousness or confusion that family thinks is related to opioids. Patient taking differently: Place into the nose See admin instructions. "To use if pt develops unconsciousness or confusion that family thinks is related to opioids" 12/30/19   Lovorn, Jinny Blossom, MD  ondansetron (ZOFRAN) 4 MG tablet TAKE 1-2 TABLETS BY MOUTH EVERY 8 HOURS AS NEEDED FOR NAUSEA, VOMITING OR REFRACTORY NAUSEA / VOMITING (MAX DOSE IN A DAY IS 24 MG TOTAL). 06/16/20   Lovorn, Jinny Blossom, MD  ondansetron (ZOFRAN) 8 MG tablet Take 1 tablet (8 mg total) by mouth every 8 (eight) hours as needed for nausea, vomiting or refractory nausea / vomiting (max dose in a day is 24 mg total). 03/26/20 03/26/21  Vivi Barrack, MD  oxybutynin (DITROPAN) 5 MG tablet Take 5 mg by mouth See admin  instructions. Take 5 mg by mouth three times a day and an additional 5 mg once daily as needed for urinary urgency 11/13/19   [provider]  oxyCODONE-acetaminophen (PERCOCET) 10-325 MG tablet Take 1 tablet by mouth every 3 (three) hours as needed (breakthrough pain). 06/10/20   Lovorn, Jinny Blossom, MD  PARoxetine (PAXIL) 40 MG tablet TAKE 1 TABLET BY MOUTH EVERYDAY AT BEDTIME - PA IN PROGRESS Patient taking differently: Take 40 mg by mouth at bedtime. 03/30/20   Lovorn, Jinny Blossom, MD  Probiotic Product (PROBIOTIC COLON SUPPORT) CAPS Take 1 capsule by mouth daily.    [provider]  promethazine (PHENERGAN) 12.5 MG tablet Take 1 tablet (12.5 mg total) by mouth every 6 (six) hours as needed for nausea, vomiting or refractory nausea / vomiting. 04/28/20   Lovorn, Jinny Blossom, MD  QUEtiapine (SEROQUEL) 50 MG tablet Take 50 mg by mouth at bedtime.  05/11/19   [provider]  Respiratory Therapy Supplies (SPIROMETER) KIT Use 3-4 times per hour. 11/05/19   Vivi Barrack, MD  traZODone (DESYREL) 100 MG tablet Take 1-2 tablets (100-200 mg total) by mouth at bedtime. 06/10/20   Lovorn, Jinny Blossom, MD  vancomycin (VANCOCIN) 125 MG capsule Take 1 capsule (125 mg total) by mouth 4 (four) times daily. 04/20/20   Golden Circle, FNP    Allergies  Omnicef [cefdinir]  Review of Systems   Review of Systems  All other systems reviewed and are negative.   Physical Exam Updated Vital Signs Ht _0  (1.905 m)   Wt 81.6 kg   SpO2 94% Comment: 2L [place]  BMI 22.50 kg/m   Physical Exam Vitals and nursing note reviewed.  Constitutional:      Appearance: He is well-developed and well-nourished.  HENT:     Head: Normocephalic and atraumatic.     Mouth/Throat:     Mouth: Mucous membranes are dry.     Comments: Very dry mucous membranes Eyes:     Conjunctiva/sclera: Conjunctivae normal.  Cardiovascular:     Rate and Rhythm: Normal rate and regular rhythm.     Heart sounds: No murmur  heard.   Pulmonary:     Effort: Respiratory distress (mild) present.     Comments: Rhonchi Mildly increased work of breathing Abdominal:     Palpations: Abdomen is soft.     Tenderness: There is no abdominal tenderness.     Comments: Suprapubic catheter  Musculoskeletal:        General: No edema.     Cervical back: Neck supple.     Comments: Baseline paraplegia  Skin:    General: Skin is warm and dry.  Neurological:     Mental Status: He is alert and oriented to person, place, and time.  Psychiatric:        Mood and Affect: Mood and affect and mood normal.        Behavior: Behavior normal.     ED Results / Procedures / Treatments   Labs (all labs ordered are listed, but only abnormal results are displayed) Labs Reviewed  BRAIN NATRIURETIC PEPTIDE  C-REACTIVE PROTEIN  COMPREHENSIVE METABOLIC PANEL  CBC WITH DIFFERENTIAL/PLATELET  FERRITIN  D-DIMER, QUANTITATIVE (NOT AT [facility])  FIBRINOGEN  LACTATE DEHYDROGENASE  PROCALCITONIN  POC SARS CORONAVIRUS 2 AG -  ED  ABO/RH  TYPE AND SCREEN  TROPONIN I (HIGH SENSITIVITY)    EKG None  Radiology No results found.  Procedures Procedures (including critical care time)  Medications Ordered in ED Medications - No data to display  ED Course  I have reviewed the triage vital signs and the nursing notes.  Pertinent labs & imaging results that were available during my care of the patient were reviewed by me and considered in my medical decision making (see chart for details).    MDM Rules/Calculators/A&P                          This patient complains of cough and SOB, this involves an extensive number of treatment options, and is a complaint that carries with it a high risk of complications and morbidity.    Differential Dx COVID, pneumonia, PE  Pertinent Labs I ordered, reviewed, and interpreted labs, which included CBC 11.5, BMP reassuring, COVID negative.  Imaging Interpretation I ordered imaging studies which  included CXR.  I independently visualized and interpreted the CXR, which showed patchy opacities.   Medications I ordered medication levaquin for CAP.  Sources Previous records obtained and reviewed hx of chronic osteomyelitis of hip, on cipro and oral vanc.  IV and PICC recently dc'd and removed.   Critical Interventions  none  Reassessments After the interventions stated above, I reevaluated the patient and found patient still needing O2.  Will admit.  Consultants Dr. Marlowe Sax, who is appreciated for admitting.  Plan Admit  Final Clinical Impression(s) / ED Diagnoses Final diagnoses:  Community acquired pneumonia, unspecified laterality    Rx / DC Orders ED Discharge Orders    None       Montine Circle, PA-C 06/20/20 0225    Mesner, Corene Cornea, MD 06/20/20 [phone]

## 2020-06-19 NOTE — ED Triage Notes (Signed)
BIB [facility] from home. Pt started to have some difficulty breathing and shortness of breath starting this AM. Pt is coughing up mucus. Vaccinated without booster. Hx: intubated for 8 months in 2020, paraplegic.   140/100 110-120 heart rate 100.8 temp 90% room air

## 2020-06-19 NOTE — Telephone Encounter (Signed)
Spoke to pt- will stop Methadone since not helping- and increase oxycodone to 10/325 mg for prn pain meds.  Refused to see Dr Ulice Bold yesterday since hurting so much and is coughing a lot and last 24 hours, not eating well.  Sounds like might be getting resp illness.    Will take him to ER if not better by tomorrow.

## 2020-06-20 ENCOUNTER — Inpatient Hospital Stay (HOSPITAL_COMMUNITY): Payer: No Typology Code available for payment source

## 2020-06-20 DIAGNOSIS — Z933 Colostomy status: Secondary | ICD-10-CM

## 2020-06-20 DIAGNOSIS — K2289 Other specified disease of esophagus: Secondary | ICD-10-CM | POA: Diagnosis present

## 2020-06-20 DIAGNOSIS — M25559 Pain in unspecified hip: Secondary | ICD-10-CM | POA: Diagnosis present

## 2020-06-20 DIAGNOSIS — F1120 Opioid dependence, uncomplicated: Secondary | ICD-10-CM | POA: Diagnosis present

## 2020-06-20 DIAGNOSIS — R6520 Severe sepsis without septic shock: Secondary | ICD-10-CM

## 2020-06-20 DIAGNOSIS — Z803 Family history of malignant neoplasm of breast: Secondary | ICD-10-CM | POA: Diagnosis not present

## 2020-06-20 DIAGNOSIS — F329 Major depressive disorder, single episode, unspecified: Secondary | ICD-10-CM | POA: Diagnosis present

## 2020-06-20 DIAGNOSIS — J189 Pneumonia, unspecified organism: Secondary | ICD-10-CM | POA: Diagnosis present

## 2020-06-20 DIAGNOSIS — G894 Chronic pain syndrome: Secondary | ICD-10-CM | POA: Diagnosis present

## 2020-06-20 DIAGNOSIS — A419 Sepsis, unspecified organism: Secondary | ICD-10-CM | POA: Diagnosis present

## 2020-06-20 DIAGNOSIS — F419 Anxiety disorder, unspecified: Secondary | ICD-10-CM | POA: Diagnosis present

## 2020-06-20 DIAGNOSIS — Z881 Allergy status to other antibiotic agents status: Secondary | ICD-10-CM | POA: Diagnosis not present

## 2020-06-20 DIAGNOSIS — J9601 Acute respiratory failure with hypoxia: Secondary | ICD-10-CM

## 2020-06-20 DIAGNOSIS — Z20822 Contact with and (suspected) exposure to covid-19: Secondary | ICD-10-CM | POA: Diagnosis present

## 2020-06-20 DIAGNOSIS — Z86718 Personal history of other venous thrombosis and embolism: Secondary | ICD-10-CM | POA: Diagnosis not present

## 2020-06-20 DIAGNOSIS — M86652 Other chronic osteomyelitis, left thigh: Secondary | ICD-10-CM | POA: Diagnosis present

## 2020-06-20 DIAGNOSIS — Z6822 Body mass index (BMI) 22.0-22.9, adult: Secondary | ICD-10-CM | POA: Diagnosis not present

## 2020-06-20 DIAGNOSIS — Z87891 Personal history of nicotine dependence: Secondary | ICD-10-CM | POA: Diagnosis not present

## 2020-06-20 DIAGNOSIS — G8220 Paraplegia, unspecified: Secondary | ICD-10-CM | POA: Diagnosis present

## 2020-06-20 DIAGNOSIS — E876 Hypokalemia: Secondary | ICD-10-CM | POA: Diagnosis present

## 2020-06-20 DIAGNOSIS — Z9359 Other cystostomy status: Secondary | ICD-10-CM | POA: Diagnosis not present

## 2020-06-20 DIAGNOSIS — Z228 Carrier of other infectious diseases: Secondary | ICD-10-CM | POA: Diagnosis not present

## 2020-06-20 DIAGNOSIS — K219 Gastro-esophageal reflux disease without esophagitis: Secondary | ICD-10-CM | POA: Diagnosis present

## 2020-06-20 DIAGNOSIS — R1310 Dysphagia, unspecified: Secondary | ICD-10-CM | POA: Diagnosis present

## 2020-06-20 DIAGNOSIS — E440 Moderate protein-calorie malnutrition: Secondary | ICD-10-CM | POA: Diagnosis present

## 2020-06-20 DIAGNOSIS — Z993 Dependence on wheelchair: Secondary | ICD-10-CM | POA: Diagnosis not present

## 2020-06-20 LAB — URINALYSIS, ROUTINE W REFLEX MICROSCOPIC
Bilirubin Urine: NEGATIVE
Glucose, UA: NEGATIVE mg/dL
Ketones, ur: 20 mg/dL — AB
Nitrite: NEGATIVE
Protein, ur: 30 mg/dL — AB
RBC / HPF: >50 RBC/hpf — ABNORMAL HIGH (ref 0–5)
Specific Gravity, Urine: 1.015 (ref 1.005–1.030)
WBC, UA: >50 WBC/hpf — ABNORMAL HIGH (ref 0–5)
pH: 5 (ref 5.0–8.0)

## 2020-06-20 LAB — LACTIC ACID, PLASMA: Lactic Acid, Venous: 0.9 mmol/L (ref 0.5–1.9)

## 2020-06-20 LAB — RESP PANEL BY RT-PCR (FLU A&B, COVID) ARPGX2
Influenza A by PCR: NEGATIVE
Influenza B by PCR: NEGATIVE
SARS Coronavirus 2 by RT PCR: NEGATIVE

## 2020-06-20 LAB — C-REACTIVE PROTEIN: CRP: 23.5 mg/dL — ABNORMAL HIGH (ref <1.0)

## 2020-06-20 LAB — STREP PNEUMONIAE URINARY ANTIGEN: Strep Pneumo Urinary Antigen: NEGATIVE

## 2020-06-20 LAB — FERRITIN: Ferritin: 254 ng/mL (ref 24–336)

## 2020-06-20 LAB — TROPONIN I (HIGH SENSITIVITY): Troponin I (High Sensitivity): 11 ng/L (ref <18)

## 2020-06-20 LAB — PROCALCITONIN: Procalcitonin: 0.22 ng/mL

## 2020-06-20 LAB — HIV ANTIBODY (ROUTINE TESTING W REFLEX): HIV Screen 4th Generation wRfx: NONREACTIVE

## 2020-06-20 LAB — POC SARS CORONAVIRUS 2 AG -  ED: SARS Coronavirus 2 Ag: NEGATIVE

## 2020-06-20 MED ORDER — BACID PO TABS
1.0000 | ORAL_TABLET | Freq: Every day | ORAL | Status: DC
  Administered 2020-06-20 – 2020-06-24 (×5): 1 via ORAL
  Filled 2020-06-20 (×5): qty 1

## 2020-06-20 MED ORDER — TRAZODONE HCL 50 MG PO TABS
100.0000 mg | ORAL_TABLET | Freq: Every day | ORAL | Status: DC
  Administered 2020-06-20 – 2020-06-24 (×5): 100 mg via ORAL
  Filled 2020-06-20 (×5): qty 2

## 2020-06-20 MED ORDER — NALOXONE HCL 4 MG/0.1ML NA LIQD: 1.0000 | Freq: Every day | NASAL | Status: DC | PRN

## 2020-06-20 MED ORDER — PAROXETINE HCL 20 MG PO TABS
40.0000 mg | ORAL_TABLET | Freq: Every day | ORAL | Status: DC
  Administered 2020-06-20 – 2020-06-24 (×5): 40 mg via ORAL
  Filled 2020-06-20 (×5): qty 2

## 2020-06-20 MED ORDER — OXYBUTYNIN CHLORIDE 5 MG PO TABS
5.0000 mg | ORAL_TABLET | Freq: Three times a day (TID) | ORAL | Status: DC
  Administered 2020-06-20 – 2020-06-24 (×14): 5 mg via ORAL
  Filled 2020-06-20 (×15): qty 1

## 2020-06-20 MED ORDER — MELATONIN 3 MG PO TABS
3.0000 mg | ORAL_TABLET | Freq: Every day | ORAL | Status: DC
Start: 2020-06-20 — End: 2020-06-25
  Administered 2020-06-20 – 2020-06-24 (×5): 3 mg via ORAL
  Filled 2020-06-20 (×6): qty 1

## 2020-06-20 MED ORDER — ALBUTEROL SULFATE (2.5 MG/3ML) 0.083% IN NEBU: 2.5000 mg | INHALATION_SOLUTION | Freq: Four times a day (QID) | RESPIRATORY_TRACT | Status: DC | PRN

## 2020-06-20 MED ORDER — ENOXAPARIN SODIUM 80 MG/0.8ML ~~LOC~~ SOLN
1.0000 mg/kg | Freq: Two times a day (BID) | SUBCUTANEOUS | Status: DC
  Filled 2020-06-20: qty 0.82

## 2020-06-20 MED ORDER — LEVOFLOXACIN IN D5W 750 MG/150ML IV SOLN
750.0000 mg | Freq: Once | INTRAVENOUS | Status: AC
  Administered 2020-06-20: 750 mg via INTRAVENOUS
  Filled 2020-06-20: qty 150

## 2020-06-20 MED ORDER — SODIUM CHLORIDE 0.9 % IV SOLN
2.0000 g | INTRAVENOUS | Status: DC
  Administered 2020-06-20 – 2020-06-24 (×5): 2 g via INTRAVENOUS
  Filled 2020-06-20 (×2): qty 20
  Filled 2020-06-20: qty 2
  Filled 2020-06-20: qty 20
  Filled 2020-06-20: qty 2
  Filled 2020-06-20: qty 20

## 2020-06-20 MED ORDER — CIPROFLOXACIN HCL 500 MG PO TABS
500.0000 mg | ORAL_TABLET | Freq: Two times a day (BID) | ORAL | Status: DC
  Administered 2020-06-20 – 2020-06-21 (×4): 500 mg via ORAL
  Filled 2020-06-20 (×5): qty 1

## 2020-06-20 MED ORDER — OXYCODONE HCL 5 MG PO TABS
5.0000 mg | ORAL_TABLET | ORAL | Status: DC | PRN
  Administered 2020-06-20: 5 mg via ORAL
  Filled 2020-06-20 (×2): qty 1

## 2020-06-20 MED ORDER — ENOXAPARIN SODIUM 80 MG/0.8ML ~~LOC~~ SOLN
80.0000 mg | Freq: Two times a day (BID) | SUBCUTANEOUS | Status: DC
  Administered 2020-06-20 – 2020-06-24 (×9): 80 mg via SUBCUTANEOUS
  Filled 2020-06-20 (×11): qty 0.8

## 2020-06-20 MED ORDER — PROMETHAZINE HCL 25 MG PO TABS
12.5000 mg | ORAL_TABLET | Freq: Four times a day (QID) | ORAL | Status: DC | PRN
  Administered 2020-06-21: 12.5 mg via ORAL
  Filled 2020-06-20: qty 1

## 2020-06-20 MED ORDER — IOHEXOL 350 MG/ML SOLN
66.0000 mL | Freq: Once | INTRAVENOUS | Status: AC | PRN
  Administered 2020-06-20: 66 mL via INTRAVENOUS

## 2020-06-20 MED ORDER — VANCOMYCIN 50 MG/ML ORAL SOLUTION
125.0000 mg | Freq: Four times a day (QID) | ORAL | Status: DC
  Administered 2020-06-20 – 2020-06-24 (×13): 125 mg via ORAL
  Filled 2020-06-20 (×23): qty 2.5

## 2020-06-20 MED ORDER — MIRABEGRON ER 25 MG PO TB24
50.0000 mg | ORAL_TABLET | Freq: Every day | ORAL | Status: DC
  Administered 2020-06-20 – 2020-06-24 (×5): 50 mg via ORAL
  Filled 2020-06-20: qty 2
  Filled 2020-06-20: qty 1
  Filled 2020-06-20: qty 2
  Filled 2020-06-20: qty 1
  Filled 2020-06-20: qty 2

## 2020-06-20 MED ORDER — SENNOSIDES-DOCUSATE SODIUM 8.6-50 MG PO TABS
2.0000 | ORAL_TABLET | Freq: Two times a day (BID) | ORAL | Status: DC
  Administered 2020-06-20 – 2020-06-24 (×8): 2 via ORAL
  Filled 2020-06-20 (×10): qty 2

## 2020-06-20 MED ORDER — SODIUM CHLORIDE 0.9 % IV SOLN
500.0000 mg | INTRAVENOUS | Status: DC
  Administered 2020-06-21 – 2020-06-22 (×2): 500 mg via INTRAVENOUS
  Filled 2020-06-20 (×2): qty 500

## 2020-06-20 MED ORDER — LIDOCAINE 5 % EX PTCH
3.0000 | MEDICATED_PATCH | Freq: Two times a day (BID) | CUTANEOUS | Status: DC
  Administered 2020-06-21 – 2020-06-24 (×5): 3 via TRANSDERMAL
  Filled 2020-06-20 (×10): qty 3

## 2020-06-20 MED ORDER — FAMOTIDINE 20 MG PO TABS
20.0000 mg | ORAL_TABLET | Freq: Every day | ORAL | Status: DC
  Administered 2020-06-20 – 2020-06-24 (×5): 20 mg via ORAL
  Filled 2020-06-20 (×6): qty 1

## 2020-06-20 MED ORDER — OXYCODONE-ACETAMINOPHEN 5-325 MG PO TABS
1.0000 | ORAL_TABLET | ORAL | Status: DC | PRN
  Administered 2020-06-20 – 2020-06-22 (×9): 1 via ORAL
  Filled 2020-06-20 (×9): qty 1

## 2020-06-20 MED ORDER — GUAIFENESIN ER 600 MG PO TB12
600.0000 mg | ORAL_TABLET | Freq: Two times a day (BID) | ORAL | Status: DC
  Administered 2020-06-20 – 2020-06-24 (×10): 600 mg via ORAL
  Filled 2020-06-20 (×10): qty 1

## 2020-06-20 MED ORDER — FENTANYL 75 MCG/HR TD PT72
1.0000 | MEDICATED_PATCH | TRANSDERMAL | Status: DC
Start: 2020-06-22 — End: 2020-06-25
  Administered 2020-06-21: 1 via TRANSDERMAL
  Filled 2020-06-20: qty 1

## 2020-06-20 MED ORDER — DRONABINOL 2.5 MG PO CAPS
2.5000 mg | ORAL_CAPSULE | Freq: Two times a day (BID) | ORAL | Status: DC | PRN
  Filled 2020-06-20: qty 1

## 2020-06-20 NOTE — ED Notes (Signed)
Patient transported to CT 

## 2020-06-20 NOTE — Progress Notes (Signed)
ANTICOAGULATION CONSULT NOTE - Initial Consult  Pharmacy Consult for Enoxaparin Indication: VTE Treatment  Allergies  Allergen Reactions  . Omnicef [Cefdinir] Nausea And Vomiting    Patient Measurements: Height: 6\' 3"  (190.5 cm) Weight: 81.6 kg (180 lb) IBW/kg (Calculated) : 84.5   Vital Signs: Temp: 98 F (36.7 C) (01/08 0515) Temp Source: Oral (01/08 0515) BP: 125/73 (01/08 0645) Pulse Rate: 91 (01/08 0645)  Labs: Recent Labs    06/19/20 2246 06/20/20 0050  HGB 12.3*  --   HCT 39.3  --   PLT 204  --   CREATININE 0.68  --   TROPONINIHS 4 11    Estimated Creatinine Clearance: 123.3 mL/min (by C-G formula based on SCr of 0.68 mg/dL).   Medical History: Past Medical History:  Diagnosis Date  . Acute on chronic respiratory failure with hypoxia (HCC) 06/2018   trach removed 11-16-2018, on vent from jan until may 2020 - uses albuterol prn  . Anxiety   . Bacteremia due to Pseudomonas 06/2018  . Chronic osteomyelitis (HCC)   . Chronic pain syndrome   . Clostridium difficile colitis 10/30/2019   tx with abx   . Depression   . DVT (deep venous thrombosis) (HCC) 2020   right brachial post PICC line  . History of blood transfusion 06/2018  . History of kidney stones   . Hypertension    norvasc d/c by pcp on 11/05/19  . Multiple traumatic injuries   . Penile pain 11/18/2019  . Pneumonia 11/2009   2020 x 2  . 12/2009 as ambulation aid   . Wheelchair bound    electric  . Wound discharge    left hip wound with bloody/clear drainage change dressing q day surgilube with gauze, between legs wound using calcium algenate pad bid    Assessment: 55 yo with paraplegia admitted with SOB and cough. History of DVT, has been off his eliquis "a while ago". Pharmacy consulted to start Enoxaparin for VTE treatment.  Goal of Therapy:  Anti-Xa level 0.6-1 units/ml 4hrs after LMWH dose given Monitor platelets by anticoagulation protocol: Yes   Plan:  Lovenox 1 mg/kg bid Monitor  platelets while on therapy.  45, PharmD, [facility] Clinical Pharmacist Please see AMION for all Pharmacists' Contact Phone Numbers 06/20/2020, 8:30 AM
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
## 2020-06-20 NOTE — ED Notes (Signed)
Pt colostomy and foley emptied; pt adjusted on bed on his side.

## 2020-06-20 NOTE — ED Notes (Signed)
Update given to wife, [name].

## 2020-06-20 NOTE — ED Notes (Signed)
Patient was given 2 Diet Cokes and Cup of ice water.

## 2020-06-20 NOTE — Progress Notes (Signed)
Pharmacy Antibiotic Note  Christopher [name] is a 55 y.o. male admitted on 06/19/2020 with pneumonia.  Pharmacy has been consulted for Levofloxacin dosing.  Patient's allergy to cefdinir was nausea and vomiting and he has received cephalosporins in the past. Switched to azithro and ceftriaxone,  Plan: Azithro 500 mg IV q24hr Ceftriaxone 2 gms IV q24hr Monitor clinical status and cultures  Height: 6\' 3"  (190.5 cm) Weight: 81.6 kg (180 lb) IBW/kg (Calculated) : 84.5  Temp (24hrs), Avg:98.6 F (37 C), Min:98 F (36.7 C), Max:99.2 F (37.3 C)  Recent Labs  Lab 06/19/20 2246  WBC 11.5*  CREATININE 0.68    Estimated Creatinine Clearance: 123.3 mL/min (by C-G formula based on SCr of 0.68 mg/dL).    Allergies  Allergen Reactions  . Omnicef [Cefdinir] Nausea And Vomiting    Antimicrobials this admission: Levoflox 1/8 x 1 Azithro 1/9 >>  Rocephin 1/8 >>   Thank you for allowing pharmacy to be a part of this patient's care.  3/8, PharmD, [facility] Clinical Pharmacist Please see AMION for all Pharmacists' Contact Phone Numbers 06/20/2020, 8:23 AM

## 2020-06-21 LAB — URINE CULTURE: Culture: >=100000 — AB

## 2020-06-21 MED ORDER — GABAPENTIN 300 MG PO CAPS
300.0000 mg | ORAL_CAPSULE | Freq: Three times a day (TID) | ORAL | Status: DC
  Administered 2020-06-21 – 2020-06-24 (×10): 300 mg via ORAL
  Filled 2020-06-21 (×10): qty 1

## 2020-06-21 MED ORDER — CLONAZEPAM 0.25 MG PO TBDP
0.2500 mg | ORAL_TABLET | Freq: Three times a day (TID) | ORAL | Status: DC | PRN
  Administered 2020-06-21 – 2020-06-22 (×2): 0.25 mg via ORAL
  Filled 2020-06-21 (×3): qty 1

## 2020-06-21 MED ORDER — LORAZEPAM 2 MG/ML IJ SOLN
1.0000 mg | Freq: Once | INTRAMUSCULAR | Status: AC
  Administered 2020-06-21: 1 mg via INTRAVENOUS
  Filled 2020-06-21: qty 1
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
## 2020-06-21 NOTE — Plan of Care (Signed)
  Problem: Nutrition: Goal: Adequate nutrition will be maintained Outcome: Progressing   Problem: Coping: Goal: Level of anxiety will decrease Outcome: Progressing   Problem: Education: Goal: Knowledge of General Education information will improve Description: Including pain rating scale, medication(s)/side effects and non-pharmacologic comfort measures Outcome: Progressing   Problem: Health Behavior/Discharge Planning: Goal: Ability to manage health-related needs will improve Outcome: Progressing   Problem: Clinical Measurements: Goal: Ability to maintain clinical measurements within normal limits will improve Outcome: Progressing Goal: Will remain free from infection Outcome: Progressing Goal: Diagnostic test results will improve Outcome: Progressing Goal: Respiratory complications will improve Outcome: Progressing Goal: Cardiovascular complication will be avoided Outcome: Progressing   Problem: Activity: Goal: Risk for activity intolerance will decrease Outcome: Progressing   Problem: Nutrition: Goal: Adequate nutrition will be maintained Outcome: Progressing   Problem: Coping: Goal: Level of anxiety will decrease Outcome: Progressing   Problem: Elimination: Goal: Will not experience complications related to bowel motility Outcome: Progressing Goal: Will not experience complications related to urinary retention Outcome: Progressing   Problem: Pain Managment: Goal: General experience of comfort will improve Outcome: Progressing   Problem: Safety: Goal: Ability to remain free from injury will improve Outcome: Progressing   Problem: Skin Integrity: Goal: Risk for impaired skin integrity will decrease Outcome: Progressing   

## 2020-06-22 ENCOUNTER — Telehealth: Payer: Self-pay | Admitting: *Deleted

## 2020-06-22 LAB — CBC WITH DIFFERENTIAL/PLATELET
Abs Immature Granulocytes: 0.02 10*3/uL (ref 0.00–0.07)
Basophils Absolute: 0 10*3/uL (ref 0.0–0.1)
Basophils Relative: 1 %
Eosinophils Absolute: 0.4 10*3/uL (ref 0.0–0.5)
Eosinophils Relative: 7 %
HCT: 33.4 % — ABNORMAL LOW (ref 39.0–52.0)
Hemoglobin: 10.6 g/dL — ABNORMAL LOW (ref 13.0–17.0)
Immature Granulocytes: 0 %
Lymphocytes Relative: 21 %
Lymphs Abs: 1.1 10*3/uL (ref 0.7–4.0)
MCH: 31.1 pg (ref 26.0–34.0)
MCHC: 31.7 g/dL (ref 30.0–36.0)
MCV: 97.9 fL (ref 80.0–100.0)
Monocytes Absolute: 0.5 10*3/uL (ref 0.1–1.0)
Monocytes Relative: 10 %
Neutro Abs: 3.2 10*3/uL (ref 1.7–7.7)
Neutrophils Relative %: 61 %
Platelets: 225 10*3/uL (ref 150–400)
RBC: 3.41 MIL/uL — ABNORMAL LOW (ref 4.22–5.81)
RDW: 12.6 % (ref 11.5–15.5)
WBC: 5.2 10*3/uL (ref 4.0–10.5)
nRBC: 0 % (ref 0.0–0.2)

## 2020-06-22 LAB — BASIC METABOLIC PANEL
Anion gap: 10 (ref 5–15)
BUN: 12 mg/dL (ref 6–20)
CO2: 25 mmol/L (ref 22–32)
Calcium: 9.2 mg/dL (ref 8.9–10.3)
Chloride: 101 mmol/L (ref 98–111)
Creatinine, Ser: 0.6 mg/dL — ABNORMAL LOW (ref 0.61–1.24)
GFR, Estimated: >60 mL/min (ref >60)
Glucose, Bld: 69 mg/dL — ABNORMAL LOW (ref 70–99)
Potassium: 3.1 mmol/L — ABNORMAL LOW (ref 3.5–5.1)
Sodium: 136 mmol/L (ref 135–145)

## 2020-06-22 LAB — PROCALCITONIN: Procalcitonin: 0.16 ng/mL

## 2020-06-22 MED ORDER — ENSURE ENLIVE PO LIQD
237.0000 mL | Freq: Two times a day (BID) | ORAL | Status: DC
  Administered 2020-06-22: 237 mL via ORAL

## 2020-06-22 MED ORDER — CIPROFLOXACIN HCL 500 MG PO TABS
500.0000 mg | ORAL_TABLET | Freq: Two times a day (BID) | ORAL | Status: DC
Start: 2020-06-22 — End: 2020-06-25
  Administered 2020-06-22 – 2020-06-24 (×5): 500 mg via ORAL
  Filled 2020-06-22 (×8): qty 1

## 2020-06-22 MED ORDER — ADULT MULTIVITAMIN W/MINERALS CH
1.0000 | ORAL_TABLET | Freq: Every day | ORAL | Status: DC
  Administered 2020-06-23 – 2020-06-24 (×2): 1 via ORAL
  Filled 2020-06-22 (×2): qty 1

## 2020-06-22 MED ORDER — POTASSIUM CHLORIDE CRYS ER 20 MEQ PO TBCR
40.0000 meq | EXTENDED_RELEASE_TABLET | ORAL | Status: AC
Start: 2020-06-22 — End: 2020-06-22
  Administered 2020-06-22 (×2): 40 meq via ORAL
  Filled 2020-06-22 (×2): qty 2

## 2020-06-22 MED ORDER — FLUCONAZOLE 100 MG PO TABS
200.0000 mg | ORAL_TABLET | Freq: Every day | ORAL | Status: DC
  Administered 2020-06-22: 200 mg via ORAL
  Filled 2020-06-22: qty 2

## 2020-06-22 MED ORDER — OXYCODONE-ACETAMINOPHEN 5-325 MG PO TABS
1.0000 | ORAL_TABLET | ORAL | Status: DC | PRN
  Administered 2020-06-22: 1 via ORAL
  Administered 2020-06-22 – 2020-06-23 (×2): 2 via ORAL
  Administered 2020-06-23: 1 via ORAL
  Administered 2020-06-23 – 2020-06-24 (×4): 2 via ORAL
  Filled 2020-06-22 (×5): qty 2
  Filled 2020-06-22 (×2): qty 1
  Filled 2020-06-22: qty 2
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
## 2020-06-22 NOTE — Progress Notes (Signed)
Patient continues to cough despite crushing medications and putting them in applesauce. I followed up with Speech but they are unable to see him today. Ripley Fraise D

## 2020-06-22 NOTE — Telephone Encounter (Signed)
Peighton has been admitted to [facility].  Mrs Weintraub is asking for a call back from Dr Berline Chough.  Her number is [phone].

## 2020-06-22 NOTE — Telephone Encounter (Signed)
Spoke about pt's confusion/hallucination about seeing son in bed with him in hospital.  ML

## 2020-06-23 ENCOUNTER — Inpatient Hospital Stay (HOSPITAL_COMMUNITY): Payer: No Typology Code available for payment source

## 2020-06-23 LAB — BASIC METABOLIC PANEL
Anion gap: 12 (ref 5–15)
BUN: 10 mg/dL (ref 6–20)
CO2: 25 mmol/L (ref 22–32)
Calcium: 9.2 mg/dL (ref 8.9–10.3)
Chloride: 103 mmol/L (ref 98–111)
Creatinine, Ser: 0.62 mg/dL (ref 0.61–1.24)
GFR, Estimated: >60 mL/min (ref >60)
Glucose, Bld: 68 mg/dL — ABNORMAL LOW (ref 70–99)
Potassium: 3.8 mmol/L (ref 3.5–5.1)
Sodium: 140 mmol/L (ref 135–145)

## 2020-06-23 LAB — CBC WITH DIFFERENTIAL/PLATELET
Abs Immature Granulocytes: 0.01 10*3/uL (ref 0.00–0.07)
Basophils Absolute: 0 10*3/uL (ref 0.0–0.1)
Basophils Relative: 1 %
Eosinophils Absolute: 0.3 10*3/uL (ref 0.0–0.5)
Eosinophils Relative: 7 %
HCT: 35.1 % — ABNORMAL LOW (ref 39.0–52.0)
Hemoglobin: 11 g/dL — ABNORMAL LOW (ref 13.0–17.0)
Immature Granulocytes: 0 %
Lymphocytes Relative: 23 %
Lymphs Abs: 1 10*3/uL (ref 0.7–4.0)
MCH: 30.8 pg (ref 26.0–34.0)
MCHC: 31.3 g/dL (ref 30.0–36.0)
MCV: 98.3 fL (ref 80.0–100.0)
Monocytes Absolute: 0.3 10*3/uL (ref 0.1–1.0)
Monocytes Relative: 8 %
Neutro Abs: 2.6 10*3/uL (ref 1.7–7.7)
Neutrophils Relative %: 61 %
Platelets: 242 10*3/uL (ref 150–400)
RBC: 3.57 MIL/uL — ABNORMAL LOW (ref 4.22–5.81)
RDW: 12.3 % (ref 11.5–15.5)
WBC: 4.2 10*3/uL (ref 4.0–10.5)
nRBC: 0 % (ref 0.0–0.2)

## 2020-06-23 LAB — MAGNESIUM: Magnesium: 1.8 mg/dL (ref 1.7–2.4)

## 2020-06-23 LAB — PROCALCITONIN: Procalcitonin: 0.1 ng/mL

## 2020-06-23 LAB — PHOSPHORUS: Phosphorus: 3.2 mg/dL (ref 2.5–4.6)

## 2020-06-23 MED ORDER — CLONAZEPAM 0.5 MG PO TBDP
0.5000 mg | ORAL_TABLET | Freq: Three times a day (TID) | ORAL | Status: DC | PRN
  Administered 2020-06-23 – 2020-06-24 (×2): 0.5 mg via ORAL
  Filled 2020-06-23 (×2): qty 1

## 2020-06-23 NOTE — Progress Notes (Signed)
Pt refused dressing change due to pain.
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
## 2020-06-23 NOTE — Plan of Care (Signed)
  Problem: Nutrition: Goal: Adequate nutrition will be maintained Outcome: Progressing   Problem: Coping: Goal: Level of anxiety will decrease Outcome: Progressing   Problem: Education: Goal: Knowledge of General Education information will improve Description: Including pain rating scale, medication(s)/side effects and non-pharmacologic comfort measures Outcome: Progressing   Problem: Health Behavior/Discharge Planning: Goal: Ability to manage health-related needs will improve Outcome: Progressing   Problem: Clinical Measurements: Goal: Ability to maintain clinical measurements within normal limits will improve Outcome: Progressing Goal: Will remain free from infection Outcome: Progressing Goal: Diagnostic test results will improve Outcome: Progressing Goal: Respiratory complications will improve Outcome: Progressing Goal: Cardiovascular complication will be avoided Outcome: Progressing   Problem: Activity: Goal: Risk for activity intolerance will decrease Outcome: Progressing   Problem: Nutrition: Goal: Adequate nutrition will be maintained Outcome: Progressing   Problem: Coping: Goal: Level of anxiety will decrease Outcome: Progressing   Problem: Elimination: Goal: Will not experience complications related to bowel motility Outcome: Progressing Goal: Will not experience complications related to urinary retention Outcome: Progressing   Problem: Pain Managment: Goal: General experience of comfort will improve Outcome: Progressing   Problem: Safety: Goal: Ability to remain free from injury will improve Outcome: Progressing   Problem: Skin Integrity: Goal: Risk for impaired skin integrity will decrease Outcome: Progressing
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
## 2020-06-23 NOTE — Progress Notes (Addendum)
SLP Note  Patient Details Name: DAREON NUNZIATO MRN: [id number] DOB: 1965-08-10   Cancelled treatment:       Reason Eval/Treat Not Completed: Pain limiting ability to participate. Pt requested to allow time for pain medication to start working. He continued to refuse to participate despite encouragement. Will continue efforts. RN and MD aware.  Ellington Cornia B. Murvin Natal, [facility], CCC-SLP Speech Language Pathologist Office: [phone] Pager: [phone]  Leigh Aurora 06/23/2020, 9:14 AM

## 2020-06-24 LAB — CBC WITH DIFFERENTIAL/PLATELET
Abs Immature Granulocytes: 0.02 10*3/uL (ref 0.00–0.07)
Basophils Absolute: 0 10*3/uL (ref 0.0–0.1)
Basophils Relative: 1 %
Eosinophils Absolute: 0.3 10*3/uL (ref 0.0–0.5)
Eosinophils Relative: 7 %
HCT: 35.8 % — ABNORMAL LOW (ref 39.0–52.0)
Hemoglobin: 11 g/dL — ABNORMAL LOW (ref 13.0–17.0)
Immature Granulocytes: 1 %
Lymphocytes Relative: 25 %
Lymphs Abs: 1.1 10*3/uL (ref 0.7–4.0)
MCH: 30.4 pg (ref 26.0–34.0)
MCHC: 30.7 g/dL (ref 30.0–36.0)
MCV: 98.9 fL (ref 80.0–100.0)
Monocytes Absolute: 0.4 10*3/uL (ref 0.1–1.0)
Monocytes Relative: 9 %
Neutro Abs: 2.5 10*3/uL (ref 1.7–7.7)
Neutrophils Relative %: 57 %
Platelets: 250 10*3/uL (ref 150–400)
RBC: 3.62 MIL/uL — ABNORMAL LOW (ref 4.22–5.81)
RDW: 12.2 % (ref 11.5–15.5)
WBC: 4.3 10*3/uL (ref 4.0–10.5)
nRBC: 0 % (ref 0.0–0.2)

## 2020-06-24 LAB — GLUCOSE, CAPILLARY: Glucose-Capillary: 85 mg/dL (ref 70–99)

## 2020-06-24 LAB — BASIC METABOLIC PANEL
Anion gap: 13 (ref 5–15)
BUN: 5 mg/dL — ABNORMAL LOW (ref 6–20)
CO2: 24 mmol/L (ref 22–32)
Calcium: 9.2 mg/dL (ref 8.9–10.3)
Chloride: 101 mmol/L (ref 98–111)
Creatinine, Ser: 0.56 mg/dL — ABNORMAL LOW (ref 0.61–1.24)
GFR, Estimated: >60 mL/min (ref >60)
Glucose, Bld: 72 mg/dL (ref 70–99)
Potassium: 3.4 mmol/L — ABNORMAL LOW (ref 3.5–5.1)
Sodium: 138 mmol/L (ref 135–145)

## 2020-06-24 MED ORDER — ENOXAPARIN SODIUM 40 MG/0.4ML ~~LOC~~ SOLN: 40.0000 mg | SUBCUTANEOUS | Status: DC

## 2020-06-24 MED ORDER — CEFDINIR 300 MG PO CAPS: 300.0000 mg | ORAL_CAPSULE | Freq: Two times a day (BID) | ORAL | 0 refills | Status: AC

## 2020-06-24 NOTE — Progress Notes (Signed)
Patient refused wound care this shift. 

## 2020-06-24 NOTE — TOC Transition Note (Addendum)
Transition of Care [facility]) - CM/SW Discharge Note   Patient Details  Name: Christopher [name] MRN: [id number] Date of Birth: 19-Jun-1965  Transition of [facility]) CM/SW Contact:  Lorri Frederick, LCSW Phone Number: 06/24/2020, 1:55 PM   Clinical Narrative:  Pt discharging home today.  Pt requests PTAR transport, which was arranged.  HH through [facility], [place] notified. CSW spoke with Laretta Bolster, [phone]  paradigm/workman's comp case manager, who requested DME O2 be set up through [facility], which was done.  No other needs identified.   PTAR phone #: [phone], if needed.     Final next level of care: Home w Home Health Services Barriers to Discharge: Barriers Resolved   Patient Goals and CMS Choice Patient states their goals for this hospitalization and ongoing recovery are:: back to normal CMS Medicare.gov Compare Post Acute Care list provided to::  ([facility] already in place, pt wants to continue with them)    Discharge Placement                       Discharge Plan and Services     Post Acute Care Choice: Home Health          DME Arranged: Oxygen DME Agency: [facility] Date DME Agency Contacted: 06/24/20 Time DME Agency Contacted: 1352 Representative spoke with at DME Agency: Duwayne Heck HH Arranged: PT HH Agency: [facility] Date HH Agency Contacted: 06/22/20 Time HH Agency Contacted: 1318 Representative spoke with at [facility] Agency: Andrez Grime  Social Determinants of Health (SDOH) Interventions     Readmission Risk Interventions Readmission Risk Prevention Plan 06/23/2020 11/01/2019 09/18/2019  Transportation Screening Complete Complete Complete  PCP or Specialist Appt within 3-5 Days Not Complete Complete -  Not Complete comments First available appt on 06/2020. - -  HRI or Home Care Consult Complete Complete -  Social Work Consult for Recovery Care Planning/Counseling Complete Complete -  Palliative Care Screening Not Applicable  Complete -  Medication Review Oceanographer) - Complete Complete  PCP or Specialist appointment within 3-5 days of discharge - - Complete  PCP/Specialist Appt Not Complete comments - - -  HRI or Home Care Consult - - Complete  HRI or Home Care Consult Pt Refusal Comments - - -  SW Recovery Care/Counseling Consult - - Complete  SW Consult Not Complete Comments - - -  Palliative Care Screening - - Complete  Skilled Nursing Facility - - Complete  Some recent data might be hidden

## 2020-06-24 NOTE — Discharge Summary (Addendum)
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 diagnosis of SARS-CoV-2 by FDA under an Emergency Use Authorization (EUA). This EUA will remain in effect (meaning this test can be used) for the duration of the COVID-19 declaration under Section 564(b)(1) of the Act, 21 U.S.C. section 360bbb-3(b)(1), unless the authorization is terminated or revoked.  Performed at [facility], [place] [address] [place], [place] [address]   Culture, blood (routine x 2)     Status: None (Preliminary result)   Collection Time: 06/20/20 11:42 AM   Specimen: BLOOD  Result Value Ref Range Status   Specimen Description BLOOD SITE NOT SPECIFIED  Final   Special  Requests   Final    BOTTLES DRAWN AEROBIC AND ANAEROBIC Blood Culture results may not be optimal due to an excessive volume of blood received in culture bottles   Culture   Final    NO GROWTH 4 DAYS Performed at [facility], [place] [address], [place], [place] [address]    Report Status PENDING  Incomplete  Culture, blood (routine x 2)     Status: None (Preliminary result)   Collection Time: 06/20/20 11:43 AM   Specimen: BLOOD  Result Value Ref Range Status   Specimen Description BLOOD SITE NOT SPECIFIED  Final   Special Requests   Final    BOTTLES DRAWN AEROBIC AND ANAEROBIC Blood Culture adequate volume   Culture   Final    NO GROWTH 4 DAYS Performed at [facility], [address], [place], [place] [address]    Report Status PENDING  Incomplete     Signed: Marlowe Aschoff Honora Searson  [facility] 06/24/2020, 3:11 PM

## 2020-06-24 NOTE — Progress Notes (Signed)
Completed O2 test at rest due to patient being unable to walk. Patient saturation 88% on RA while at rest.
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
## 2020-06-24 NOTE — Plan of Care (Signed)
  Problem: Nutrition: Goal: Adequate nutrition will be maintained Outcome: Progressing   Problem: Coping: Goal: Level of anxiety will decrease Outcome: Progressing   Problem: Education: Goal: Knowledge of General Education information will improve Description: Including pain rating scale, medication(s)/side effects and non-pharmacologic comfort measures Outcome: Progressing   Problem: Health Behavior/Discharge Planning: Goal: Ability to manage health-related needs will improve Outcome: Progressing   Problem: Clinical Measurements: Goal: Ability to maintain clinical measurements within normal limits will improve Outcome: Progressing Goal: Will remain free from infection Outcome: Progressing Goal: Diagnostic test results will improve Outcome: Progressing Goal: Respiratory complications will improve Outcome: Progressing Goal: Cardiovascular complication will be avoided Outcome: Progressing   Problem: Activity: Goal: Risk for activity intolerance will decrease Outcome: Progressing   Problem: Nutrition: Goal: Adequate nutrition will be maintained Outcome: Progressing   Problem: Coping: Goal: Level of anxiety will decrease Outcome: Progressing   Problem: Elimination: Goal: Will not experience complications related to bowel motility Outcome: Progressing Goal: Will not experience complications related to urinary retention Outcome: Progressing   Problem: Pain Managment: Goal: General experience of comfort will improve Outcome: Progressing   Problem: Safety: Goal: Ability to remain free from injury will improve Outcome: Progressing   Problem: Skin Integrity: Goal: Risk for impaired skin integrity will decrease Outcome: Progressing   

## 2020-06-25 LAB — CULTURE, BLOOD (ROUTINE X 2)
Culture: NO GROWTH
Culture: NO GROWTH
Special Requests: ADEQUATE

## 2020-06-25 NOTE — Progress Notes (Signed)
Pt left via [facility] w/belongings and O2 tank and O2 machine. Call given to wife to make sure someone will be at home PER [facility]. She stated its okay.

## 2020-06-25 NOTE — Progress Notes (Addendum)
Pt is ready to go. Called [facility] and stated that he is number two on list. Stated it should be no more than an hour.   Pt refused some of his meds. States that he is ready to go.

## 2020-06-30 ENCOUNTER — Telehealth: Payer: Self-pay | Admitting: *Deleted

## 2020-06-30 NOTE — Telephone Encounter (Signed)
Mrs Huelsmann is asking for a call from Dr Berline Chough.  Mr Earnhart has been discharged from the hospital and she would like to discuss some things with Dr Berline Chough.

## 2020-07-02 ENCOUNTER — Ambulatory Visit (INDEPENDENT_AMBULATORY_CARE_PROVIDER_SITE_OTHER): Payer: Self-pay | Admitting: Family Medicine

## 2020-07-02 ENCOUNTER — Encounter: Payer: Self-pay | Admitting: Family Medicine

## 2020-07-02 ENCOUNTER — Other Ambulatory Visit: Payer: Self-pay

## 2020-07-02 VITALS — BP 118/81 | HR 74 | Temp 98.9°F | Ht 75.0 in

## 2020-07-02 DIAGNOSIS — J9611 Chronic respiratory failure with hypoxia: Secondary | ICD-10-CM | POA: Insufficient documentation

## 2020-07-02 DIAGNOSIS — J9691 Respiratory failure, unspecified with hypoxia: Secondary | ICD-10-CM | POA: Insufficient documentation

## 2020-07-02 DIAGNOSIS — R1310 Dysphagia, unspecified: Secondary | ICD-10-CM | POA: Insufficient documentation

## 2020-07-02 DIAGNOSIS — R030 Elevated blood-pressure reading, without diagnosis of hypertension: Secondary | ICD-10-CM

## 2020-07-02 MED ORDER — PRO-STAT 64 PO LIQD: 30.0000 mL | Freq: Three times a day (TID) | ORAL | 0 refills | Status: DC

## 2020-07-02 NOTE — Assessment & Plan Note (Signed)
At goal off amlodipine.
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
## 2020-07-02 NOTE — Assessment & Plan Note (Signed)
Concern for possible aspiration.  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  Will place referral to GI for further evaluation.  Has never had endoscopy.

## 2020-07-02 NOTE — Patient Instructions (Signed)
It was very nice to see you today!  I will place a referral for you to see a pulmonologist.  Months.  Also place referral for you to see a gastroenterologist for your swallowing issues.  I am okay with your blood pressure medication changes.  Take care, Dr [name]  Please try these tips to maintain a healthy lifestyle:   Eat at least 3 REAL meals and 1-2 snacks per day.  Aim for no more than 5 hours between eating.  If you eat breakfast, please do so within one hour of getting up.    Each meal should contain half fruits/vegetables, one quarter protein, and one quarter carbs (no bigger than a computer mouse)   Cut down on sweet beverages. This includes juice, soda, and sweet tea.     Drink at least 1 glass of water with each meal and aim for at least 8 glasses per day   Exercise at least 150 minutes every week.

## 2020-07-02 NOTE — Assessment & Plan Note (Addendum)
Patient now on 3 L of supplemental oxygen.  O2 sats at home in upper 80s if he gets off of his oxygen.  Concern that chronic aspiration could be contributing.  Also has a history of  VTE which could be contributing. Given his new oxygen requirement will place referral to pulmonology for further testing.  He has albuterol to use as needed.  We will also continue his current supplemental oxygen.

## 2020-07-05 ENCOUNTER — Other Ambulatory Visit: Payer: Self-pay | Admitting: Physical Medicine and Rehabilitation

## 2020-07-07 ENCOUNTER — Telehealth: Payer: Self-pay | Admitting: *Deleted

## 2020-07-07 MED ORDER — FENTANYL 75 MCG/HR TD PT72: 1.0000 | MEDICATED_PATCH | TRANSDERMAL | 0 refills | Status: DC

## 2020-07-07 NOTE — Telephone Encounter (Signed)
I forwarded to [facility] to fill. Per PMP :06/04/2020 Fentanyl 75 Mcg/hr Patch15.00 30 Me Lov

## 2020-07-07 NOTE — Telephone Encounter (Signed)
Christopher [name] called and reports that Christopher [name] has put on a patch today and has one fentanyl patch left.

## 2020-07-07 NOTE — Telephone Encounter (Signed)
Dr Berline Chough note was reviewed. PMP was reviewed.  Fentanyl e-scribed today. Sybil RN called family regarding the above.

## 2020-07-09 ENCOUNTER — Other Ambulatory Visit: Payer: Self-pay

## 2020-07-09 ENCOUNTER — Telehealth (INDEPENDENT_AMBULATORY_CARE_PROVIDER_SITE_OTHER): Payer: No Typology Code available for payment source | Admitting: Surgical

## 2020-07-09 ENCOUNTER — Encounter: Payer: Self-pay | Admitting: Surgical

## 2020-07-09 DIAGNOSIS — S31809D Unspecified open wound of unspecified buttock, subsequent encounter: Secondary | ICD-10-CM | POA: Diagnosis not present

## 2020-07-09 DIAGNOSIS — T07XXXA Unspecified multiple injuries, initial encounter: Secondary | ICD-10-CM | POA: Diagnosis not present

## 2020-07-09 DIAGNOSIS — L089 Local infection of the skin and subcutaneous tissue, unspecified: Secondary | ICD-10-CM

## 2020-07-09 DIAGNOSIS — T148XXA Other injury of unspecified body region, initial encounter: Secondary | ICD-10-CM | POA: Diagnosis not present

## 2020-07-09 DIAGNOSIS — M86652 Other chronic osteomyelitis, left thigh: Secondary | ICD-10-CM
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
## 2020-07-14 ENCOUNTER — Other Ambulatory Visit: Payer: Self-pay

## 2020-07-14 ENCOUNTER — Ambulatory Visit (INDEPENDENT_AMBULATORY_CARE_PROVIDER_SITE_OTHER): Payer: No Typology Code available for payment source | Admitting: Family

## 2020-07-14 ENCOUNTER — Encounter: Payer: Self-pay | Admitting: Family

## 2020-07-14 VITALS — BP 112/77 | HR 98 | Temp 97.7°F

## 2020-07-14 DIAGNOSIS — G894 Chronic pain syndrome: Secondary | ICD-10-CM

## 2020-07-14 DIAGNOSIS — A498 Other bacterial infections of unspecified site: Secondary | ICD-10-CM

## 2020-07-14 DIAGNOSIS — B965 Pseudomonas (aeruginosa) (mallei) (pseudomallei) as the cause of diseases classified elsewhere: Secondary | ICD-10-CM

## 2020-07-14 DIAGNOSIS — K5903 Drug induced constipation: Secondary | ICD-10-CM | POA: Diagnosis not present

## 2020-07-14 DIAGNOSIS — M86652 Other chronic osteomyelitis, left thigh: Secondary | ICD-10-CM | POA: Diagnosis not present

## 2020-07-14 DIAGNOSIS — T402X5A Adverse effect of other opioids, initial encounter: Secondary | ICD-10-CM

## 2020-07-14 DIAGNOSIS — Z1624 Resistance to multiple antibiotics: Secondary | ICD-10-CM

## 2020-07-14 MED ORDER — NALOXEGOL OXALATE 12.5 MG PO TABS: 12.5000 mg | ORAL_TABLET | Freq: Every day | ORAL | 11 refills | Status: DC

## 2020-07-14 NOTE — Assessment & Plan Note (Signed)
Improved with addition of Movantik. Refills sent to the pharmacy.

## 2020-07-14 NOTE — Progress Notes (Signed)
Subjective:    Patient ID: Christopher [name], male    DOB: 24-Sep-1965, 55 y.o.   MRN: [id number]  Chief Complaint  Patient presents with  . Osteomyelitis      HPI:  Christopher [name] is a 55 y.o. male with multiple pelvic fractures sustained from trauma complicated by hardware associated osteomyelitis status post removal of hardware and excision of bone with cultures positive for multidrug-resistant Pseudomonas and currently maintained on ciprofloxacin and oral vancomycin who was last seen on 05/18/2020.  In the interim he has been seen by [facility] and infectious [facility] and presented with the options for potential surgical intervention with hemipelviectomy and possible removal of part of his sacrum and attempt to cure his osteomyelitis. Spoke with Dr. Susa Griffins regarding potential infectious disease treatments and currently Christopher [name] remains on ciprofloxacin with oral vancomycin for C. difficile prophylaxis.  Here today for routine follow-up.  Christopher [name] continues to take his ciprofloxacin and oral vancomycin as prescribed with no adverse side effects.  He is having increased levels of pain today.  Course was recently complicated by development of community-acquired pneumonia in early January and continues to require oxygen.  His wife continues to provide wound care and describes small area that remains with drainage and waxes and wanes.  Other 2 wounds are closed.  He is on prostat for protein supplementation.  Working on offloading sites.  Movantik has helped with his constipation.   Allergies  Allergen Reactions  . Omnicef [Cefdinir] Nausea And Vomiting      Outpatient Medications Prior to Visit  Medication Sig Dispense Refill  . albuterol (VENTOLIN HFA) 108 (90 Base) MCG/ACT inhaler TAKE 2 PUFFS BY MOUTH EVERY 6 HOURS AS NEEDED FOR WHEEZE OR SHORTNESS OF BREATH 18 each 1  . Amino Acids-Protein Hydrolys (FEEDING SUPPLEMENT, PRO-STAT 64,) LIQD Take 30 mLs by mouth 3  (three) times daily with meals. 887 mL 0  . Blood Pressure Monitoring (BLOOD PRESSURE CUFF) MISC Use daily as needed to check blood pressure. 1 each 0  . ciprofloxacin (CIPRO) 500 MG tablet Take 1 tablet (500 mg total) by mouth 2 (two) times daily. 60 tablet 11  . clonazePAM (KLONOPIN) 0.5 MG tablet Take 0.5 tablets (0.25 mg total) by mouth 3 (three) times daily as needed for anxiety. 50 tablet 3  . dextromethorphan-guaiFENesin (MUCINEX DM) 30-600 MG 12hr tablet Take 2 tablets by mouth 2 (two) times daily.    Marland Kitchen dronabinol (MARINOL) 2.5 MG capsule TAKE 1 CAPSULE (2.5 MG TOTAL) BY MOUTH 2 (TWO) TIMES DAILY BEFORE A MEAL. (Patient taking differently: Take 2.5 mg by mouth 2 (two) times daily as needed (to encourage appetite).) 60 capsule 5  . famotidine (PEPCID) 20 MG tablet Take 20 mg by mouth daily.    . fentaNYL (DURAGESIC) 75 MCG/HR Place 1 patch onto the skin every other day. 15 patch 0  . gabapentin (NEURONTIN) 300 MG capsule Take 1 capsule (300 mg total) by mouth 3 (three) times daily. 270 capsule 3  . lidocaine (LIDODERM) 5 % PLACE 3 PATCHES ONTO THE SKIN EVERY 12 HOURS. REMOVE & DISCARD PATCH WITHIN 12 HOURS OR AS DIRECTED BY MD 90 patch 5  . melatonin ([employer] MELATONIN) 3 MG TABS tablet Take 1 tablet (3 mg total) by mouth at bedtime. 10 tablet 0  . methocarbamol (ROBAXIN) 500 MG tablet TAKE 1 TABLET BY MOUTH EVERY 6 HOURS AS NEEDED FOR MUSCLE SPASMS (Patient taking differently: Take by mouth every 6 (six) hours.) 100 tablet 11  .  mirabegron ER (MYRBETRIQ) 50 MG TB24 tablet Take 50 mg by mouth daily.    . Multiple Vitamin (MULTIVITAMIN WITH MINERALS) TABS tablet Take 1 tablet by mouth daily.    . naloxone (NARCAN) nasal spray 4 mg/0.1 mL To use if pt develops unconsciousness or confusion that family thinks is related to opioids. (Patient taking differently: Place 1 spray into the nose daily as needed ("To [place] if pt develops unconsciousness or confusion that family thinks is related to opiods").) 1  each 3  . ondansetron (ZOFRAN) 8 MG tablet Take 1 tablet (8 mg total) by mouth every 8 (eight) hours as needed for nausea, vomiting or refractory nausea / vomiting (max dose in a day is 24 mg total). 120 tablet 5  . oxybutynin (DITROPAN) 5 MG tablet Take 5 mg by mouth See admin instructions. Take 5 mg by mouth three times a day and an additional 5 mg once daily as needed for urinary urgency    . oxyCODONE-acetaminophen (PERCOCET) 10-325 MG tablet Take 1 tablet by mouth every 3 (three) hours as needed (breakthrough pain). 180 tablet 0  . PARoxetine (PAXIL) 40 MG tablet TAKE 1 TABLET BY MOUTH EVERYDAY AT BEDTIME - PA IN PROGRESS (Patient taking differently: Take 40 mg by mouth at bedtime.) 90 tablet 2  . Probiotic Product (PROBIOTIC COLON SUPPORT) CAPS Take 1 capsule by mouth daily.    . promethazine (PHENERGAN) 12.5 MG tablet Take 1 tablet (12.5 mg total) by mouth every 6 (six) hours as needed for nausea, vomiting or refractory nausea / vomiting. 90 tablet 5  . Respiratory Therapy Supplies (SPIROMETER) KIT Use 3-4 times per hour. 1 kit 0  . senna-docusate (SENOKOT-S) 8.6-50 MG tablet Take 2 tablets by mouth See admin instructions. Takes 2 in the morning and 2 at night.    . traZODone (DESYREL) 100 MG tablet TAKE 1-2 TABLETS (100-200 MG TOTAL) BY MOUTH AT BEDTIME. 180 tablet 2  . vancomycin (VANCOCIN) 125 MG capsule Take 1 capsule (125 mg total) by mouth 4 (four) times daily. 120 capsule 11  . naloxegol oxalate (MOVANTIK) 12.5 MG TABS tablet Take 1 tablet (12.5 mg total) by mouth daily. 30 tablet 1   No facility-administered medications prior to visit.     Past Medical History:  Diagnosis Date  . Acute on chronic respiratory failure with hypoxia ([place]) 06/2018   trach removed 11-16-2018, on vent from jan until may 2020 - uses albuterol prn  . Anxiety   . Bacteremia due to Pseudomonas 06/2018  . Chronic osteomyelitis ([place])   . Chronic pain syndrome   . Clostridium difficile colitis 10/30/2019    tx with abx   . Depression   . DVT (deep venous thrombosis) ([place]) 2020   right brachial post PICC line  . History of blood transfusion 06/2018  . History of kidney stones   . Hypertension    norvasc d/c by pcp on 11/05/19  . Multiple traumatic injuries   . Penile pain 11/18/2019  . Pneumonia 11/2009   2020 x 2  . Gilford Rile as ambulation aid   . Wheelchair bound    electric  . Wound discharge    left hip wound with bloody/clear drainage change dressing q day surgilube with gauze, between legs wound using calcium algenate pad bid     Past Surgical History:  Procedure Laterality Date  . APPLICATION OF A-CELL OF BACK N/A 08/06/2018   Procedure: Application Of A-Cell Of Back;  Surgeon: [name], DO;  Location: [place];  Service: Clinical cytogeneticist;  Laterality: N/A;  . APPLICATION OF A-CELL OF EXTREMITY Left 08/06/2018   Procedure: Application Of A-Cell Of Extremity;  Surgeon: [name], DO;  Location: [place];  Service: Plastics;  Laterality: Left;  . APPLICATION OF A-CELL OF EXTREMITY Left 09/18/2019   Procedure: APPLICATION OF A-CELL OF EXTREMITY;  Surgeon: [name], DO;  Location: [place];  Service: Plastics;  Laterality: Left;  . APPLICATION OF WOUND VAC  07/12/2018   Procedure: Application Of Wound Vac to the Left Thigh and Scrotum.;  Surgeon: Shona Needles, MD;  Location: [place];  Service: Orthopedics;;  . APPLICATION OF WOUND VAC  07/10/2018   Procedure: Application Of Wound Vac;  Surgeon: Clovis Riley, MD;  Location: [place];  Service: General;;  . COLON SURGERY  2020   colostomy  . COLOSTOMY N/A 07/23/2018   Procedure: COLOSTOMY;  Surgeon: Georganna Skeans, MD;  Location: [place];  Service: General;  Laterality: N/A;  . CYSTOSCOPY W/ URETERAL STENT PLACEMENT N/A 07/15/2018   Procedure: RETROGRADE URETHROGRAM;  Surgeon: Franchot Gallo, MD;  Location: [place];  Service: Urology;  Laterality: N/A;  . CYSTOSCOPY WITH LITHOLAPAXY N/A 05/06/2019   Procedure: CYSTOSCOPY  BASKET BLADDER STONE EXTRACTION;  Surgeon: Cleon Gustin, MD;  [facility];  Service: Urology;  Laterality: N/A;  30 MINS  . CYSTOSTOMY N/A 05/06/2019   Procedure: REPLACEMENT OF SUPRAPUBIC CATHETER;  Surgeon: Cleon Gustin, MD;  Location: [facility];  Service: Urology;  Laterality: N/A;  . DEBRIDEMENT AND CLOSURE WOUND Left 03/04/2019   Procedure: Excision of hip wound with placement of A[place];  Surgeon: [name], DO;  Location: [place];  Service: Plastics;  Laterality: Left;  . ESOPHAGOGASTRODUODENOSCOPY N/A 08/14/2018   Procedure: ESOPHAGOGASTRODUODENOSCOPY (EGD);  Surgeon: Georganna Skeans, MD;  Location: [place];  Service: General;  Laterality: N/A;  bedside  . FACIAL RECONSTRUCTION SURGERY     X 2--once as a teenager and second time in his 60's  . HARDWARE REMOVAL Left 03/04/2019   Procedure: Left Hip Hardware Removal;  Surgeon: Shona Needles, MD;  Location: [place];  Service: Orthopedics;  Laterality: Left;  . HIP PINNING,CANNULATED Left 07/12/2018   Procedure: CANNULATED HIP PINNING;  Surgeon: Shona Needles, MD;  Location: [place];  Service: Orthopedics;  Laterality: Left;  . HIP SURGERY    . HOLMIUM LASER APPLICATION Right 11/14/8935   Procedure: HOLMIUM LASER APPLICATION;  Surgeon: Cleon Gustin, MD;  Location: [facility];  Service: Urology;  Laterality: Right;  . I & D EXTREMITY Left 07/25/2018   Procedure: Debridement of buttock, scrotum and left leg, placement of acell and vac;  Surgeon: [name], DO;  Location: [place];  Service: Plastics;  Laterality: Left;  . I & D EXTREMITY N/A 08/06/2018   Procedure: Debridement of buttock, scrotum and left leg;  Surgeon: [name], DO;  Location: [place];  Service: Plastics;  Laterality: N/A;  . I & D EXTREMITY N/A 08/13/2018   Procedure: Debridement of buttock, scrotum and left leg, placement of acell and vac;  Surgeon: [name], DO;   Location: [place];  Service: Plastics;  Laterality: N/A;  90 min, please  . INCISION AND DRAINAGE HIP Left 09/18/2019   Procedure: IRRIGATION AND DEBRIDEMENT HIP/ PELVIS WITH WOUND VAC PLACEMENT;  Surgeon: Shona Needles, MD;  Location: [place];  Service: Orthopedics;  Laterality: Left;  . INCISION AND DRAINAGE OF WOUND N/A 07/18/2018  Procedure: Debridement of left leg, buttocks and scrotal wound with placement of acell and Flexiseal;  Surgeon: [name], DO;  Location: [place];  Service: Plastics;  Laterality: N/A;  . INCISION AND DRAINAGE OF WOUND Left 08/29/2018   Procedure: Debridement of buttock, scrotum and left leg, placement of acell and vac;  Surgeon: [name], DO;  Location: [place];  Service: Plastics;  Laterality: Left;  75 min, please  . INCISION AND DRAINAGE OF WOUND Bilateral 10/23/2018   Procedure: DEBRIDEMENT OF BUTTOCK,SCROTUM, AND LEG WOUNDS WITH PLACEMENT OF ACELL- BILATERAL 90 MIN;  Surgeon: [name], DO;  Location: [place];  Service: Plastics;  Laterality: Bilateral;  . IR ANGIOGRAM PELVIS SELECTIVE OR SUPRASELECTIVE  07/10/2018  . IR ANGIOGRAM PELVIS SELECTIVE OR SUPRASELECTIVE  07/10/2018  . IR ANGIOGRAM SELECTIVE EACH ADDITIONAL VESSEL  07/10/2018  . IR EMBO ART  VEN HEMORR LYMPH EXTRAV  INC GUIDE ROADMAPPING  07/10/2018  . IR NEPHROSTOMY PLACEMENT LEFT  04/05/2019  . IR NEPHROSTOMY PLACEMENT RIGHT  05/31/2019  . IR US GUIDE BX ASP/DRAIN  07/10/2018  . IR US GUIDE VASC ACCESS RIGHT  07/10/2018  . IR VENO/EXT/UNI LEFT  07/10/2018  . IRRIGATION AND DEBRIDEMENT OF WOUND WITH SPLIT THICKNESS SKIN GRAFT Left 09/19/2018   Procedure: Debridement of gluteal wound with placement of acell to left leg.;  Surgeon: [name], DO;  Location: [place];  Service: Plastics;  Laterality: Left;  2.5 hours, please  . LAPAROTOMY N/A 07/12/2018   Procedure: EXPLORATORY LAPAROTOMY;  Surgeon: Georganna Skeans, MD;  Location: [place];  Service: General;  Laterality: N/A;  .  LAPAROTOMY N/A 07/15/2018   Procedure: WOUND EXPLORATION; CLOSURE OF ABDOMEN;  Surgeon: Georganna Skeans, MD;  Location: [place];  Service: General;  Laterality: N/A;  . LAPAROTOMY  07/10/2018   Procedure: Exploratory Laparotomy;  Surgeon: Clovis Riley, MD;  Location: [place];  Service: General;;  . MASS EXCISION Left 09/18/2019   Procedure: EXCISION UPPER LEFT INNER THIGH WOUND;  Surgeon: [name], DO;  Location: [place];  Service: Plastics;  Laterality: Left;  . NEPHROLITHOTOMY Right 07/15/2019   Procedure: NEPHROLITHOTOMY PERCUTANEOUS;  Surgeon: Cleon Gustin, MD;  Location: [facility];  Service: Urology;  Laterality: Right;  90 MINS  . PEG PLACEMENT N/A 08/14/2018   Procedure: PERCUTANEOUS ENDOSCOPIC GASTROSTOMY (PEG) PLACEMENT;  Surgeon: Georganna Skeans, MD;  Location: [place];  Service: General;  Laterality: N/A;  . PERCUTANEOUS TRACHEOSTOMY N/A 08/02/2018   Procedure: PERCUTANEOUS TRACHEOSTOMY;  Surgeon: Georganna Skeans, MD;  Location: [place];  Service: General;  Laterality: N/A;  . RADIOLOGY WITH ANESTHESIA N/A 07/10/2018   Procedure: IR WITH ANESTHESIA;  Surgeon: Sandi Mariscal, MD;  Location: [place];  Service: Radiology;  Laterality: N/A;  . RADIOLOGY WITH ANESTHESIA Right 07/10/2018   Procedure: Ir With Anesthesia;  Surgeon: Sandi Mariscal, MD;  Location: [place];  Service: Radiology;  Laterality: Right;  . SCROTAL EXPLORATION N/A 07/15/2018   Procedure: SCROTUM DEBRIDEMENT;  Surgeon: Franchot Gallo, MD;  Location: [place];  Service: Urology;  Laterality: N/A;  . SHOULDER SURGERY    . SKIN SPLIT GRAFT Right 09/19/2018   Procedure: Skin Graft Split Thickness;  Surgeon: [name], DO;  Location: [place];  Service: Plastics;  Laterality: Right;  . SKIN SPLIT GRAFT N/A 10/03/2018   Procedure: Split thickness skin graft to gluteal area with acell placement;  Surgeon: [name], DO;  Location: [place];  Service: Plastics;  Laterality: N/A;  3 hours, please  .  VACUUM ASSISTED CLOSURE CHANGE N/A 07/12/2018   Procedure: ABDOMINAL VACUUM ASSISTED CLOSURE CHANGE and abdominal washout;  Surgeon: Georganna Skeans, MD;  Location: [place];  Service: General;  Laterality: N/A;  . WOUND DEBRIDEMENT Left 07/23/2018   Procedure: DEBRIDEMENT LEFT BUTTOCK  WOUND;  Surgeon: Georganna Skeans, MD;  Location: [place];  Service: General;  Laterality: Left;  . WOUND EXPLORATION Left 07/10/2018   Procedure: WOUND EXPLORATION LEFT GROIN;  Surgeon: Clovis Riley, MD;  Location: [place];  Service: General;  Laterality: Left;       Review of Systems  Constitutional: Negative for chills, diaphoresis, fatigue and fever.  Respiratory: Negative for cough, chest tightness, shortness of breath and wheezing.   Cardiovascular: Negative for chest pain.  Gastrointestinal: Negative for abdominal pain, diarrhea, nausea and vomiting.      Objective:    BP 112/77   Pulse 98   Temp 97.7 F (36.5 C) (Oral)  Nursing note and vital signs reviewed.  Physical Exam Constitutional:      General: He is not in acute distress.    Appearance: He is well-developed and well-nourished.     Comments: Seated in the motorized wheelchair; nasal cannula in place.   Cardiovascular:     Rate and Rhythm: Normal rate and regular rhythm.     Pulses: Intact distal pulses.     Heart sounds: Normal heart sounds.  Pulmonary:     Effort: Pulmonary effort is normal.     Breath sounds: Normal breath sounds.  Skin:    General: Skin is warm and dry.  Neurological:     Mental Status: He is alert and oriented to person, place, and time.  Psychiatric:        Mood and Affect: Mood and affect normal.        Behavior: Behavior normal.        Thought Content: Thought content normal.        Judgment: Judgment normal.      Depression screen [facility] 2/9 07/02/2020 03/17/2020 12/30/2019 12/18/2019 04/10/2019  Decreased Interest 3 0 0 0 0  Down, Depressed, Hopeless 2 1 0 0 0  PHQ - 2 Score 5 1 0 0 0  Altered sleeping  3 - - - -  Tired, decreased energy 3 - - - -  Change in appetite 3 - - - -  Feeling bad or failure about yourself  2 - - - -  Trouble concentrating 3 - - - -  Moving slowly or fidgety/restless 3 - - - -  Suicidal thoughts 0 - - - -  PHQ-9 Score 22 - - - -  Some recent data might be hidden       Assessment & Plan:    Patient Active Problem List   Diagnosis Date Noted  . Respiratory failure with hypoxia ([place]) 07/02/2020  . Dysphagia 07/02/2020  . Therapeutic opioid induced constipation 04/11/2020  . Status post peripherally inserted central catheter (PICC) central line placement 03/17/2020  . Skin-picking disorder 03/12/2020  . CHF (congestive heart failure) ([place]) 01/27/2020  . Bone infection of pelvis ([place]) 12/12/2019  . Recurrent pain of right knee 11/08/2019  . Elevated blood pressure reading 11/05/2019  . Acute osteomyelitis of pelvic region and thigh ([place])   . Acute deep vein thrombosis (DVT) of brachial vein of right upper extremity ([place]) 11/01/2019  . Myofascial pain dysfunction syndrome 08/05/2019  . Protein-calorie malnutrition, severe 06/01/2019  . Complicated urinary tract infection 05/30/2019  .  Chronic osteomyelitis of pelvis, left ([place]) 03/07/2019  . Wound infection, posttraumatic 02/28/2019  . Hydronephrosis concurrent with and due to calculi of kidney and ureter 02/28/2019  . GERD (gastroesophageal reflux disease) 02/28/2019  . Suprapubic catheter ([place]) 02/28/2019  . Protein-calorie malnutrition, moderate ([place]) 02/28/2019  . 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 Reactive depression 01/29/2019  . Chronic pain due to trauma 01/29/2019  . Neuropathic pain 01/29/2019  . Colostomy status ([place]) 01/14/2019  . Insomnia 01/14/2019  . Tachycardia 01/14/2019  . Current use of proton pump inhibitor 01/14/2019  . Wound of buttock 12/28/2018  . Anxiety and depression 12/17/2018  . Debility 11/23/2018  . Chronic pain syndrome   . Multiple  traumatic injuries   . Pressure injury of skin 08/13/2018  . Urethral injury 07/13/2018     Problem List Items Addressed This Visit      Digestive   Therapeutic opioid induced constipation    Improved with addition of Movantik. Refills sent to the pharmacy.         Musculoskeletal and Integument   Chronic osteomyelitis of pelvis, left ([place]) - Primary    Christopher [name] remains stable with current dose of ciprofloxacin and oral vancomyin. At this time he has decided to hold on any surgical interventions with the concerns being the risks not outweighing the benefits. We discussed the continued need for ciprofloxacin for suppression of the Pseudomonas. Discussed continued off loading of site, wound care and optimizing nutritional intake with protein.Fortunately his wound continues to remain stable with no worsening. Will continue with current dose of ciprofloxacin. Most recent renal function stable. Plan for follow up in 3 months or sooner if needed.         Other   Chronic pain syndrome    Continues to have pain that is waxing and waning. Constipation improved with the addition of Movantik and refills sent to the pharmacy. Continue pain management per Rehabilitaiton and Primary Care teams.        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   Meds ordered this encounter  Medications  . naloxegol oxalate (MOVANTIK) 12.5 MG TABS tablet    Sig: Take 1 tablet (12.5  mg total) by mouth daily.    Dispense:  30 tablet    Refill:  11    Order Specific Question:   Supervising Provider    Answer:   Carlyle Basques [[id number]]     Follow-up: Return in about 3 months (around 10/11/2020), or if symptoms worsen or fail to improve.   Terri Piedra, MSN, FNP-C Nurse Practitioner [facility] [place] number: [phone]

## 2020-07-14 NOTE — Patient Instructions (Signed)
Nice to see you.  Continue to take your Ciprofloxacin and oral vancomycin as prescribed.   Movantik refills have been sent to pharmacy.   Continue to off load your pressure sights and take in as much protein as you are able to.   Plan for follow up in 3 months or sooner which may be a virtual visit if desired.   Have a great day and stay safe!

## 2020-07-14 NOTE — Assessment & Plan Note (Signed)
Mr. Schoneman remains stable with current dose of ciprofloxacin and oral vancomyin. At this time he has decided to hold on any surgical interventions with the concerns being the risks not outweighing the benefits. We discussed the continued need for ciprofloxacin for suppression of the Pseudomonas. Discussed continued off loading of site, wound care and optimizing nutritional intake with protein.Fortunately his wound continues to remain stable with no worsening. Will continue with current dose of ciprofloxacin. Most recent renal function stable. Plan for follow up in 3 months or sooner if needed.

## 2020-07-14 NOTE — Assessment & Plan Note (Signed)
Continues to have pain that is waxing and waning. Constipation improved with the addition of Movantik and refills sent to the pharmacy. Continue pain management per Rehabilitaiton and Primary Care teams.

## 2020-07-16 ENCOUNTER — Telehealth: Payer: Self-pay

## 2020-07-16 NOTE — Telephone Encounter (Signed)
[facility] faxed over prior auth request for ciprofloxacin 500mg   Per RN case manager the PA has to come from her.  Case manager will contact [facility] to initiate prior auth. 

## 2020-07-22 ENCOUNTER — Encounter
Payer: No Typology Code available for payment source | Attending: Physical Medicine and Rehabilitation | Admitting: Physical Medicine and Rehabilitation

## 2020-07-22 ENCOUNTER — Other Ambulatory Visit: Payer: Self-pay

## 2020-07-22 ENCOUNTER — Encounter: Payer: Self-pay | Admitting: Physical Medicine and Rehabilitation

## 2020-07-22 VITALS — BP 114/77 | HR 93 | Temp 98.9°F

## 2020-07-22 DIAGNOSIS — M86652 Other chronic osteomyelitis, left thigh: Secondary | ICD-10-CM | POA: Insufficient documentation

## 2020-07-22 DIAGNOSIS — Z993 Dependence on wheelchair: Secondary | ICD-10-CM | POA: Insufficient documentation

## 2020-07-22 DIAGNOSIS — J9691 Respiratory failure, unspecified with hypoxia: Secondary | ICD-10-CM | POA: Diagnosis present

## 2020-07-22 DIAGNOSIS — T402X5A Adverse effect of other opioids, initial encounter: Secondary | ICD-10-CM | POA: Insufficient documentation

## 2020-07-22 DIAGNOSIS — F064 Anxiety 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: Secondary | ICD-10-CM | POA: Insufficient documentation

## 2020-07-22 DIAGNOSIS — R1310 Dysphagia, unspecified: Secondary | ICD-10-CM | POA: Diagnosis present

## 2020-07-22 DIAGNOSIS — K5903 Drug induced constipation: Secondary | ICD-10-CM | POA: Diagnosis present

## 2020-07-22 DIAGNOSIS — G8921 Chronic pain due to trauma: Secondary | ICD-10-CM | POA: Insufficient documentation

## 2020-07-22 MED ORDER — OXYCODONE-ACETAMINOPHEN 10-325 MG PO TABS: 1.0000 | ORAL_TABLET | ORAL | 0 refills | Status: DC | PRN

## 2020-07-22 NOTE — Progress Notes (Signed)
Subjective:    Patient ID: Christopher [name], male    DOB: 02/12/1966, 55 y.o.   MRN: [id number]  HPI   Pt is a 55 yr old male with hx of Multitrauma- causing L femoral neck fx, degloving of L hip to going/scrotum, bladder neck trauma- got SPC, developed compartment syndrome -s/p surgery for that; also diverting colostomy, skin grafts, and s/p trachand PEG- PEG is out. 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 B/L tennis elbow new-and B/L pending/forming ulnar neuropathy Osteomyelitis of L hip found again-on  PO vanc and Cipro  Methadone made him more confused.  Stopped it! Since confusion.  Pain wasn't any different.   At the same time, went to the hospital-   Sleeping OK- Using Trazodone- some nights gives 200 mg- if slept a lot- gives 100 mg- slept well.   More talkative and sitting up more lately.   Confusion comes and goes-  They switched Oxycodone to 10 mg in hospital-  Thousands of ants/bugs crawling on the ground when in hospital- saw this with Oxycodone 10 mg in hospital. Hasn't had since.   No more hallucinations- since then. Oxycodone still at 10 mg.  Taking every 3 hours typically. Tries not to wake him up at night for it, but will wake up himself and want it.    Has now on Oxygen- O2 sats were good prior ot being in hospital- and sounded good-  sats 98% today in office- but will drop to 93% if tries to put on shirt, take O2 off, or gets to EOB, gets upset, or does any major movement- feels cannot breathe and sats drop <90%.    Still coughing up a lot of stuff- is whitish stuff- not dark yellow anymore - which is good. Is more in morning- but also throughout the day.   Has to sleep elevated- at least 30- 45 degrees sitting up to sleep.    Has to sit on side of bed to eat.  Eating pretty good. No choking or problems swallowing when eats.  If eats steak/chicken, wife cuts it up.  Was told in hospital, has to wait 30 minutes before lays down.  Was told that his "flutter valve is getting stuck" in throat.      Having poor memory issues- more than before- frequently- few times per day.    Have fixed leg elevaters/extenders - the w/c- right after got home with hospital - 2 weeks ago and got drink holder on w/c now.   Got the Zenaida Niece- in November 2021.  To sit in passenger seat, ordered lock downs- Have paid for hand controls so he will be able to drive in future, if comes down on pain meds/stronger/more alert.   Is [employer]  Only one he would fit in- it's RED!   Things are stable, overall.  Saw Dr [name]- PCP- in last 2 weeks.  Needs to see Pulmonary about O2.  Also wants to send to GI- for swallowing Thought was aspiration pneumonia- but said was Community Acquired pneumonia.  Has GI appointment 08/20/20.  With [facility].       Pain Inventory Average Pain 9 Pain Right Now 9 My pain is sharp, burning, dull, stabbing, tingling and aching  LOCATION OF PAIN  Thigh,  knee, back, buttocks, groin, hip .leg, ankle, toes  BOWEL Number of stools per week: na Oral laxative use No  Type of laxative na Enema or suppository use No  History of colostomy No  Incontinent No   BLADDER Suprapubic In and out cath, frequency monthly Able to self cath No  Bladder incontinence No  Frequent urination No  Leakage with coughing No  Difficulty starting stream No  Incomplete bladder emptying No    Mobility use a walker ability to climb steps?  no do you drive?  no use a wheelchair  Function Do you have any goals in this area?  no  Neuro/Psych weakness numbness tremor tingling trouble walking spasms dizziness confusion depression anxiety loss of taste or smell  Prior Studies Any changes since  last visit?  no  Physicians involved in your care Any changes since last visit?  no   Family History  Problem Relation Age of Onset  . Breast cancer Mother        with mets to the bones   Social History   Socioeconomic History  . Marital status: Married    Spouse name: Not on file  . Number of children: Not on file  . Years of education: Not on file  . Highest education level: Not on file  Occupational History  . Not on file  Tobacco Use  . Smoking status: Former Smoker    Packs/day: 1.00    Years: 20.00    Pack years: 20.00    Types: Cigarettes    Quit date: 07/10/2018    Years since quitting: 2.0  . Smokeless tobacco: Never Used  Vaping Use  . Vaping Use: Never used  Substance and Sexual Activity  . Alcohol use: Never  . Drug use: Yes    Types: Oxycodone, Fentanyl    Comment: Fentanyl patch/oxycodone since 06/2018  . Sexual activity: Yes  Other Topics Concern  . Not on file  Social History Narrative   ** Merged History Encounter **       Social Determinants of Health   Financial Resource Strain: Not on file  Food Insecurity: Not on file  Transportation Needs: Not on file  Physical Activity: Not on file  Stress: Not on file  Social Connections: Not on file   Past Surgical History:  Procedure Laterality Date  . APPLICATION OF A-CELL OF BACK N/A 08/06/2018   Procedure: Application Of A-Cell Of Back;  Surgeon: Peggye Form, DO;  [facility]: [facility];  Service: Plastics;  Laterality: N/A;  . APPLICATION OF A-CELL OF EXTREMITY Left 08/06/2018   Procedure: Application Of A-Cell Of Extremity;  Surgeon: Peggye Form, DO;  [facility]: [facility];  Service: Plastics;  Laterality: Left;  . APPLICATION OF A-CELL OF EXTREMITY Left 09/18/2019   Procedure: APPLICATION OF A-CELL OF EXTREMITY;  Surgeon: Peggye Form, DO;  [facility]: [facility];  Service: Plastics;  Laterality: Left;  . APPLICATION OF WOUND VAC  07/12/2018   Procedure: Application Of Wound Vac to the Left  Thigh and Scrotum.;  Surgeon: Roby Lofts, MD;  [facility]: [facility];  Service: Orthopedics;;  . APPLICATION OF WOUND VAC  07/10/2018   Procedure: Application Of Wound Vac;  Surgeon: Berna Bue, MD;  [facility]: [facility];  Service: General;;  . COLON SURGERY  2020   colostomy  . COLOSTOMY N/A 07/23/2018   Procedure: COLOSTOMY;  Surgeon: Violeta Gelinas, MD;  Location[facility];  Service: General;  Laterality: N/A;  . CYSTOSCOPY W/ URETERAL STENT  PLACEMENT N/A 07/15/2018   Procedure: RETROGRADE URETHROGRAM;  Surgeon: Marcine Matar, MD;  Location: [facility] OR;  Service: Urology;  Laterality: N/A;  . CYSTOSCOPY WITH LITHOLAPAXY N/A 05/06/2019   Procedure: CYSTOSCOPY BASKET BLADDER STONE EXTRACTION;  Surgeon: Malen Gauze, MD;  Location: [facility];  Service: Urology;  Laterality: N/A;  30 MINS  . CYSTOSTOMY N/A 05/06/2019   Procedure: REPLACEMENT OF SUPRAPUBIC CATHETER;  Surgeon: Malen Gauze, MD;  Location[facility];  Service: Urology;  Laterality: N/A;  . DEBRIDEMENT AND CLOSURE WOUND Left 03/04/2019   Procedure: Excision of hip wound with placement of Acell;  Surgeon: Peggye Form, DO;  [facility];  Service: Plastics;  Laterality: Left;  . ESOPHAGOGASTRODUODENOSCOPY N/A 08/14/2018   Procedure: ESOPHAGOGASTRODUODENOSCOPY (EGD);  Surgeon: Violeta Gelinas, MD;  Location: [facility] ENDOSCOPY;  Service: General;  Laterality: N/A;  bedside  . FACIAL RECONSTRUCTION SURGERY     X 2--once as a teenager and second time in his 74's  . HARDWARE REMOVAL Left 03/04/2019   Procedure: Left Hip Hardware Removal;  Surgeon: Roby Lofts, MD;  [facility];  Service: Orthopedics;  Laterality: Left;  . HIP PINNING,CANNULATED Left 07/12/2018   Procedure: CANNULATED HIP PINNING;  Surgeon: Roby Lofts, MD;  [facility];  Service: Orthopedics;  Laterality: Left;  . HIP SURGERY    . HOLMIUM LASER APPLICATION Right 07/15/2019   Procedure: HOLMIUM LASER APPLICATION;   Surgeon: Malen Gauze, MD;  Location: [facility];  Service: Urology;  Laterality: Right;  . I & D EXTREMITY Left 07/25/2018   Procedure: Debridement of buttock, scrotum and left leg, placement of acell and vac;  Surgeon: Peggye Form, DO;  [facility];  Service: Plastics;  Laterality: Left;  . I & D EXTREMITY N/A 08/06/2018   Procedure: Debridement of buttock, scrotum and left leg;  Surgeon: Peggye Form, DO;  [facility];  Service: Plastics;  Laterality: N/A;  . I & D EXTREMITY N/A 08/13/2018   Procedure: Debridement of buttock, scrotum and left leg, placement of acell and vac;  Surgeon: Peggye Form, DO;  [facility];  Service: Plastics;  Laterality: N/A;  90 min, please  . INCISION AND DRAINAGE HIP Left 09/18/2019   Procedure: IRRIGATION AND DEBRIDEMENT HIP/ PELVIS WITH WOUND VAC PLACEMENT;  Surgeon: Roby Lofts, MD;  [facility];  Service: Orthopedics;  Laterality: Left;  . INCISION AND DRAINAGE OF WOUND N/A 07/18/2018   Procedure: Debridement of left leg, buttocks and scrotal wound with placement of acell and Flexiseal;  Surgeon: Peggye Form, DO;  [facility];  Service: Plastics;  Laterality: N/A;  . INCISION AND DRAINAGE OF WOUND Left 08/29/2018   Procedure: Debridement of buttock, scrotum and left leg, placement of acell and vac;  Surgeon: Peggye Form, DO;  [facility];  Service: Plastics;  Laterality: Left;  75 min, please  . INCISION AND DRAINAGE OF WOUND Bilateral 10/23/2018   Procedure: DEBRIDEMENT OF BUTTOCK,SCROTUM, AND LEG WOUNDS WITH PLACEMENT OF ACELL- BILATERAL 90 MIN;  Surgeon: Peggye Form, DO;  [facility];  Service: Plastics;  Laterality: Bilateral;  . IR ANGIOGRAM PELVIS SELECTIVE OR SUPRASELECTIVE  07/10/2018  . IR ANGIOGRAM PELVIS SELECTIVE OR SUPRASELECTIVE  07/10/2018  . IR ANGIOGRAM SELECTIVE EACH ADDITIONAL VESSEL  07/10/2018  . IR EMBO ART  VEN HEMORR LYMPH EXTRAV  INC GUIDE  ROADMAPPING  07/10/2018  . IR NEPHROSTOMY PLACEMENT LEFT  04/05/2019  .  IR NEPHROSTOMY PLACEMENT RIGHT  05/31/2019  . IR US GUIDE BX ASP/DRAIN  07/10/2018  . IR US GUIDE VASC ACCESS RIGHT  07/10/2018  . IR VENO/EXT/UNI LEFT  07/10/2018  . IRRIGATION AND DEBRIDEMENT OF WOUND WITH SPLIT THICKNESS SKIN GRAFT Left 09/19/2018   Procedure: Debridement of gluteal wound with placement of acell to left leg.;  Surgeon: Peggye Formillingham, Claire S, DO;  [facility];  Service: Plastics;  Laterality: Left;  2.5 hours, please  . LAPAROTOMY N/A 07/12/2018   Procedure: EXPLORATORY LAPAROTOMY;  Surgeon: Violeta Gelinashompson, Burke, MD;  [facility];  Service: General;  Laterality: N/A;  . LAPAROTOMY N/A 07/15/2018   Procedure: WOUND EXPLORATION; CLOSURE OF ABDOMEN;  Surgeon: Violeta Gelinashompson, Burke, MD;  [facility];  Service: General;  Laterality: N/A;  . LAPAROTOMY  07/10/2018   Procedure: Exploratory Laparotomy;  Surgeon: Berna Bueonnor, Chelsea A, MD;  [facility];  Service: General;;  . MASS EXCISION Left 09/18/2019   Procedure: EXCISION UPPER LEFT INNER THIGH WOUND;  Surgeon: Peggye Formillingham, Claire S, DO;  [facility];  Service: Plastics;  Laterality: Left;  . NEPHROLITHOTOMY Right 07/15/2019   Procedure: NEPHROLITHOTOMY PERCUTANEOUS;  Surgeon: Malen GauzeMcKenzie, Patrick L, MD;  [facility];  Service: Urology;  Laterality: Right;  90 MINS  . PEG PLACEMENT N/A 08/14/2018   Procedure: PERCUTANEOUS ENDOSCOPIC GASTROSTOMY (PEG) PLACEMENT;  Surgeon: Violeta Gelinashompson, Burke, MD;  [facility];  Service: General;  Laterality: N/A;  . PERCUTANEOUS TRACHEOSTOMY N/A 08/02/2018   Procedure: PERCUTANEOUS TRACHEOSTOMY;  Surgeon: Violeta Gelinashompson, Burke, MD;  [facility];  Service: General;  Laterality: N/A;  . RADIOLOGY WITH ANESTHESIA N/A 07/10/2018   Procedure: IR WITH ANESTHESIA;  Surgeon: Simonne ComeWatts, John, MD;  [facility];  Service: Radiology;  Laterality: N/A;  . RADIOLOGY WITH ANESTHESIA Right 07/10/2018   Procedure: Ir With  Anesthesia;  Surgeon: Simonne ComeWatts, John, MD;  [facility];  Service: Radiology;  Laterality: Right;  . SCROTAL EXPLORATION N/A 07/15/2018   Procedure: SCROTUM DEBRIDEMENT;  Surgeon: Marcine Matarahlstedt, Stephen, MD;  [facility];  Service: Urology;  Laterality: N/A;  . SHOULDER SURGERY    . SKIN SPLIT GRAFT Right 09/19/2018   Procedure: Skin Graft Split Thickness;  Surgeon: Peggye Formillingham, Claire S, DO;  [facility];  Service: Plastics;  Laterality: Right;  . SKIN SPLIT GRAFT N/A 10/03/2018   Procedure: Split thickness skin graft to gluteal area with acell placement;  Surgeon: Peggye Formillingham, Claire S, DO;  [facility];  Service: Plastics;  Laterality: N/A;  3 hours, please  . VACUUM ASSISTED CLOSURE CHANGE N/A 07/12/2018   Procedure: ABDOMINAL VACUUM ASSISTED CLOSURE CHANGE and abdominal washout;  Surgeon: Violeta Gelinashompson, Burke, MD;  [facility];  Service: General;  Laterality: N/A;  . WOUND DEBRIDEMENT Left 07/23/2018   Procedure: DEBRIDEMENT LEFT BUTTOCK  WOUND;  Surgeon: Violeta Gelinashompson, Burke, MD;  [facility];  Service: General;  Laterality: Left;  . WOUND EXPLORATION Left 07/10/2018   Procedure: WOUND EXPLORATION LEFT GROIN;  Surgeon: Berna Bueonnor, Chelsea A, MD;  [facility];  Service: General;  Laterality: Left;   Past Medical History:  Diagnosis Date  . Acute on chronic respiratory failure with hypoxia (HCC) 06/2018   trach removed 11-16-2018, on vent from jan until may 2020 - uses albuterol prn  . Anxiety   . Bacteremia due to Pseudomonas 06/2018  . Chronic osteomyelitis (HCC)   . Chronic pain syndrome   . Clostridium difficile colitis 10/30/2019   tx with abx   . Depression   .  DVT (deep venous thrombosis) (HCC) 2020   right brachial post PICC line  . History of blood transfusion 06/2018  . History of kidney stones   . Hypertension    norvasc d/c by pcp on 11/05/19  . Multiple traumatic injuries   . Penile pain 11/18/2019  . Pneumonia 11/2009   2020 x 2  . Dan Humphreys as ambulation aid   . Wheelchair  bound    electric  . Wound discharge    left hip wound with bloody/clear drainage change dressing q day surgilube with gauze, between legs wound using calcium algenate pad bid   BP 114/77   Pulse 93   SpO2 98%   Opioid Risk Score:   Fall Risk Score:  `1  Depression screen PHQ 2/9  Depression screen [facility] 2/9 07/02/2020 03/17/2020 12/30/2019 12/18/2019 04/10/2019  Decreased Interest 3 0 0 0 0  Down, Depressed, Hopeless 2 1 0 0 0  PHQ - 2 Score 5 1 0 0 0  Altered sleeping 3 - - - -  Tired, decreased energy 3 - - - -  Change in appetite 3 - - - -  Feeling bad or failure about yourself  2 - - - -  Trouble concentrating 3 - - - -  Moving slowly or fidgety/restless 3 - - - -  Suicidal thoughts 0 - - - -  PHQ-9 Score 22 - - - -  Some recent data might be hidden    Review of Systems An entire ROS was completed and found ot be negative except for HPI.     Objective:   Physical Exam  Awake, alert, more interactive, accompanied by wife, NAD Has O2 in place by Person- sats 98% Mild wheezing mainly on L- decreased breath sounds mainly on L lower base.  Getting breakdown on ears from O2.      Assessment & Plan:    Pt is a 55 yr old male with hx of Multitrauma- causing L femoral neck fx, degloving of L hip to going/scrotum, bladder neck trauma- got SPC, developed compartment syndrome -s/p surgery for that; also diverting colostomy, skin grafts, and s/p trachand PEG- PEG is out. 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 B/L tennis elbow new-and B/L pending/forming ulnar neuropathy Osteomyelitis of L hip found again-on  PO vanc and Cipro  1. Has to tell wife- when cannot focus, that needs to discuss topics later.   2. Taught how to use Albuterol inhaler. To squirt past  tongue into throat/trachea/lungs- hold for 10 seconds if possible.  Then wait 1 minute between puffs.   3. Refilled Percocet 10/325 mg q3 hours prn- #180 pills- no refills- got Fentanyl Rx already this month-   4. Continue Oxygen- 3L but see if can wean SLOWLY over next few months. So, in 2-3 weeks, try to wean to 2.5 L for a few weeks, then to 2L, and so on.    5. Use Pulse ox to wean Oxygen.   6. Has suprapubic so HARD to do urine drug screen, etc- needs oral screen.   7. Use mask ear protector to put O2 line in to protect ears.   8.  F/U in 6 weeks- double appointment Added Methadone to allergies due to severe hallucinations/confusion.   I spent a  total of 40 minutes on visit- as detailed above

## 2020-07-22 NOTE — Patient Instructions (Addendum)
  Pt is a 55 yr old male with hx of Multitrauma- causing L femoral neck fx, degloving of L hip to going/scrotum, bladder neck trauma- got SPC, developed compartment syndrome -s/p surgery for that; also diverting colostomy, skin grafts, and s/p trachand PEG- PEG is out. 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 B/L tennis elbow new-and B/L pending/forming ulnar neuropathy Osteomyelitis of L hip found again-on  PO vanc and Cipro  1. Poor memory issues- Has to tell wife- when cannot focus, that needs to discuss topics later.   2. Taught how to use Albuterol inhaler. To squirt past tongue into throat/trachea/lungs- hold for 10 seconds if possible.  Then wait 1 minute between puffs.   3. Refilled Percocet 10/325 mg q3 hours prn- #180 pills- no refills- got Fentanyl Rx already this month-   4. Continue Oxygen- 3L but see if can wean SLOWLY over next few months. So, in 2-3 weeks, try to wean to 2.5 L for a few weeks, then to 2L, and so on.   Will likely take months to get in with Pulmonary.  Keep sats 94% or more  5. Use Pulse ox to wean Oxygen.   6. Has suprapubic so HARD to do urine drug screen, etc- needs oral screen.   7. Use mask ear protector to put O2 line in to protect ears.   8.  F/U in 6 weeks- double appointment

## 2020-07-24 ENCOUNTER — Other Ambulatory Visit: Payer: Self-pay | Admitting: Family Medicine

## 2020-07-27 ENCOUNTER — Other Ambulatory Visit: Payer: Self-pay | Admitting: Physical Medicine and Rehabilitation

## 2020-07-27 DIAGNOSIS — F411 Generalized anxiety disorder: Secondary | ICD-10-CM

## 2020-07-29 ENCOUNTER — Telehealth: Payer: Self-pay

## 2020-07-29 ENCOUNTER — Telehealth: Payer: Self-pay | Admitting: *Deleted

## 2020-07-29 NOTE — Telephone Encounter (Signed)
Urine drug screen for this encounter is consistent for prescribed medication 

## 2020-07-30 NOTE — Telephone Encounter (Signed)
Patient was scheduled an appointment with Dr. Ulice Bold on (Friday-07/31/20).//AB/CMA

## 2020-07-31 ENCOUNTER — Ambulatory Visit: Payer: Self-pay | Admitting: Plastic Surgery

## 2020-07-31 ENCOUNTER — Emergency Department (HOSPITAL_COMMUNITY)
Admission: EM | Admit: 2020-07-31 | Discharge: 2020-08-01 | Disposition: A | Discharge status: Home / Self Care | Payer: Self-pay | Attending: Emergency Medicine | Admitting: Emergency Medicine

## 2020-07-31 ENCOUNTER — Encounter (HOSPITAL_COMMUNITY): Payer: Self-pay

## 2020-07-31 ENCOUNTER — Other Ambulatory Visit: Payer: Self-pay

## 2020-07-31 ENCOUNTER — Telehealth: Payer: Self-pay | Admitting: Plastic Surgery

## 2020-07-31 ENCOUNTER — Emergency Department (HOSPITAL_COMMUNITY): Payer: Self-pay

## 2020-07-31 ENCOUNTER — Telehealth: Payer: Self-pay | Admitting: *Deleted

## 2020-07-31 DIAGNOSIS — G894 Chronic pain syndrome: Secondary | ICD-10-CM

## 2020-07-31 DIAGNOSIS — G8929 Other chronic pain: Secondary | ICD-10-CM | POA: Insufficient documentation

## 2020-07-31 DIAGNOSIS — M545 Low back pain, unspecified: Secondary | ICD-10-CM | POA: Insufficient documentation

## 2020-07-31 DIAGNOSIS — M8660 Other chronic osteomyelitis, unspecified site: Secondary | ICD-10-CM

## 2020-07-31 DIAGNOSIS — R109 Unspecified abdominal pain: Secondary | ICD-10-CM | POA: Insufficient documentation

## 2020-07-31 DIAGNOSIS — R8271 Bacteriuria: Secondary | ICD-10-CM | POA: Insufficient documentation

## 2020-07-31 DIAGNOSIS — I110 Hypertensive heart disease with heart failure: Secondary | ICD-10-CM | POA: Insufficient documentation

## 2020-07-31 DIAGNOSIS — I509 Heart failure, unspecified: Secondary | ICD-10-CM | POA: Insufficient documentation

## 2020-07-31 DIAGNOSIS — Z87891 Personal history of nicotine dependence: Secondary | ICD-10-CM | POA: Insufficient documentation

## 2020-07-31 DIAGNOSIS — Z955 Presence of coronary angioplasty implant and graft: Secondary | ICD-10-CM | POA: Insufficient documentation

## 2020-07-31 DIAGNOSIS — M8668 Other chronic osteomyelitis, other site: Secondary | ICD-10-CM | POA: Insufficient documentation

## 2020-07-31 LAB — URINALYSIS, ROUTINE W REFLEX MICROSCOPIC
Glucose, UA: NEGATIVE mg/dL
Ketones, ur: NEGATIVE mg/dL
Nitrite: NEGATIVE
Protein, ur: 100 mg/dL — AB
RBC / HPF: >50 RBC/hpf — ABNORMAL HIGH (ref 0–5)
Specific Gravity, Urine: 1.029 (ref 1.005–1.030)
WBC, UA: >50 WBC/hpf — ABNORMAL HIGH (ref 0–5)
pH: 5 (ref 5.0–8.0)

## 2020-07-31 LAB — COMPREHENSIVE METABOLIC PANEL
ALT: 53 U/L — ABNORMAL HIGH (ref 0–44)
AST: 29 U/L (ref 15–41)
Albumin: 3.5 g/dL (ref 3.5–5.0)
Alkaline Phosphatase: 194 U/L — ABNORMAL HIGH (ref 38–126)
Anion gap: 10 (ref 5–15)
BUN: 14 mg/dL (ref 6–20)
CO2: 25 mmol/L (ref 22–32)
Calcium: 10.2 mg/dL (ref 8.9–10.3)
Chloride: 102 mmol/L (ref 98–111)
Creatinine, Ser: 0.76 mg/dL (ref 0.61–1.24)
GFR, Estimated: >60 mL/min (ref >60)
Glucose, Bld: 100 mg/dL — ABNORMAL HIGH (ref 70–99)
Potassium: 3.7 mmol/L (ref 3.5–5.1)
Sodium: 137 mmol/L (ref 135–145)
Total Bilirubin: 1.2 mg/dL (ref 0.3–1.2)
Total Protein: 8 g/dL (ref 6.5–8.1)

## 2020-07-31 LAB — CBC WITH DIFFERENTIAL/PLATELET
Abs Immature Granulocytes: 0.01 10*3/uL (ref 0.00–0.07)
Basophils Absolute: 0 10*3/uL (ref 0.0–0.1)
Basophils Relative: 1 %
Eosinophils Absolute: 0.2 10*3/uL (ref 0.0–0.5)
Eosinophils Relative: 4 %
HCT: 41.3 % (ref 39.0–52.0)
Hemoglobin: 13.4 g/dL (ref 13.0–17.0)
Immature Granulocytes: 0 %
Lymphocytes Relative: 11 %
Lymphs Abs: 0.7 10*3/uL (ref 0.7–4.0)
MCH: 31.4 pg (ref 26.0–34.0)
MCHC: 32.4 g/dL (ref 30.0–36.0)
MCV: 96.7 fL (ref 80.0–100.0)
Monocytes Absolute: 0.3 10*3/uL (ref 0.1–1.0)
Monocytes Relative: 4 %
Neutro Abs: 5 10*3/uL (ref 1.7–7.7)
Neutrophils Relative %: 80 %
Platelets: 252 10*3/uL (ref 150–400)
RBC: 4.27 MIL/uL (ref 4.22–5.81)
RDW: 12.5 % (ref 11.5–15.5)
WBC: 6.2 10*3/uL (ref 4.0–10.5)
nRBC: 0 % (ref 0.0–0.2)

## 2020-07-31 LAB — LACTIC ACID, PLASMA: Lactic Acid, Venous: 0.9 mmol/L (ref 0.5–1.9)

## 2020-07-31 MED ORDER — MORPHINE SULFATE (PF) 4 MG/ML IV SOLN
4.0000 mg | Freq: Once | INTRAVENOUS | Status: DC
  Filled 2020-07-31 (×2): qty 1

## 2020-07-31 MED ORDER — SODIUM CHLORIDE 0.9 % IV BOLUS
1000.0000 mL | Freq: Once | INTRAVENOUS | Status: AC
  Administered 2020-07-31: 1000 mL via INTRAVENOUS

## 2020-07-31 MED ORDER — ONDANSETRON HCL 4 MG/2ML IJ SOLN
4.0000 mg | Freq: Once | INTRAMUSCULAR | Status: AC
  Administered 2020-07-31: 4 mg via INTRAVENOUS
  Filled 2020-07-31: qty 2

## 2020-07-31 MED ORDER — MORPHINE SULFATE (PF) 4 MG/ML IV SOLN
4.0000 mg | Freq: Once | INTRAVENOUS | Status: AC
  Administered 2020-07-31: 4 mg via INTRAVENOUS
  Filled 2020-07-31: qty 1

## 2020-07-31 MED ORDER — SODIUM CHLORIDE 0.9 % IV SOLN
1.0000 g | Freq: Once | INTRAVENOUS | Status: AC
  Administered 2020-07-31: 1 g via INTRAVENOUS
  Filled 2020-07-31: qty 10

## 2020-07-31 MED ORDER — IOHEXOL 300 MG/ML  SOLN
100.0000 mL | Freq: Once | INTRAMUSCULAR | Status: AC | PRN
  Administered 2020-07-31: 100 mL via INTRAVENOUS

## 2020-07-31 MED ORDER — MORPHINE SULFATE (PF) 4 MG/ML IV SOLN
4.0000 mg | Freq: Once | INTRAVENOUS | Status: AC
Start: 2020-07-31 — End: 2020-07-31
  Administered 2020-07-31: 4 mg via INTRAVENOUS
  Filled 2020-07-31: qty 1

## 2020-07-31 MED ORDER — FLUCONAZOLE 200 MG PO TABS: 200.0000 mg | ORAL_TABLET | Freq: Every day | ORAL | 0 refills | Status: AC

## 2020-07-31 MED ORDER — HYDROXYZINE HCL 10 MG PO TABS
10.0000 mg | ORAL_TABLET | Freq: Once | ORAL | Status: DC
  Filled 2020-07-31: qty 1

## 2020-07-31 NOTE — Discharge Instructions (Addendum)
It was a pleasure taking care of you today.  Your pain may be secondary to exacerbation of your chronic pain. I do not see signs of abscess or acute skin infection.  Your urinalysis likely shows colonization, however will send for culture and treat you for candida given it is possible your symptoms are secondary to this. You were given a dose of antibiotics in the ED and are to continue your home cipro rx as the cultures are pending.  Follow up with your PCP and Dr. Berline Chough. Return to the ED for worsening symptoms including fever, chest pain or shortness of breath.

## 2020-07-31 NOTE — ED Triage Notes (Signed)
Pt BIB [facility] from home c/o of left sided flank pain and lower back pain. Pt has a hx of a traumatic injury in Jan 2020 and has been bedbound since. Pt also ha indwelling catheter. Pt was seen 3 weeks ago and treated for UTI and Pneumonia and sent home on 3L O2. Pt unable to wean off Oxygen and states he is still having back pain and flank pain like when he was first diagnosed with a UTI. Pt states his wife changed his foley and he got some relief but now pain is back. Pt rates pain 10/10.

## 2020-07-31 NOTE — ED Notes (Signed)
Called [facility] to transport patient to home address.  

## 2020-07-31 NOTE — Telephone Encounter (Signed)
Called Christopher [name]- he's miserable- but no WBC is elevated, U/A to me could be colonization, or yeast could be the issue.  Will try to do a care plan at next visit-  Asked Christopher [name] to tell ER about possible neurogenic bladder/colonization.

## 2020-07-31 NOTE — Telephone Encounter (Signed)
Christopher [name] called to let Dr Berline Chough know that Christopher [name] was on his way to the ED with excessive pain.  She would like for Dr Berline Chough to call her.

## 2020-07-31 NOTE — ED Provider Notes (Signed)
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 worsening pain to evaluate for underlying abscess or other acute cause of pain shows no acute findings to explain worsening left lower back pain.  Suspect the pain may be secondary to exacerbation of chronic pain, although urinary tract infection is possible.  Initially ordered blood cx, dose of rocephin, and lactic acid with lactic acid returning normal.   UA may represent colonization--given he is chronically on cipro, has no fever or leukocytosis, feel it is reasonable to await cultures as higher likelihood UA findings represent colonization rather than true infection.  In addition, CT reports right lower lobe airspace opacity, however no cough, no fever, no leukocytosis and doubt this represents pneumonia clinically and feel it is appropirate for him to continue current home abx regimen of cipro and flagyl.  Has small left pleural effusion but not having chest or upper back pain.  Given iv fluids, morphine for pain.    Recommend continued follow up with rehab doctor for pain control and discussed reasons to return. Given rx  for fluconazole for possible symptomtic candiduria.  Patient discharged in stable condition with understanding of reasons to return.       Final Clinical Impression(s) / ED Diagnoses Final diagnoses:  Acute left-sided low back pain without sciatica  Other chronic osteomyelitis, unspecified site [facility])  Chronic pain syndrome  Bacteriuria    Rx / DC Orders ED Discharge Orders         Ordered    fluconazole (DIFLUCAN) 200 MG tablet  Daily        07/31/20 1538           Gareth Morgan, MD 07/31/20 2126

## 2020-07-31 NOTE — Telephone Encounter (Signed)
Patient's wife, [name], called to let us know that patient is being transported by ambulance to the hospital and will not be able to make it to his appt. She would like for  [facility] or Dr. Ulice Bold to give her a call when possible. [phone]

## 2020-07-31 NOTE — ED Notes (Signed)
Next on the list to be pick up

## 2020-08-01 LAB — URINE CULTURE: Culture: 60000 — AB

## 2020-08-05 LAB — CULTURE, BLOOD (ROUTINE X 2)
Culture: NO GROWTH
Culture: NO GROWTH
Special Requests: ADEQUATE

## 2020-08-07 ENCOUNTER — Telehealth: Payer: Self-pay | Admitting: Plastic Surgery
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
## 2020-08-10 ENCOUNTER — Telehealth: Payer: Self-pay | Admitting: *Deleted

## 2020-08-10 NOTE — Telephone Encounter (Signed)
Christopher [name] called for a refill on [name]' Fentanyl.  Per PMP it was last filled 07/08/20.

## 2020-08-11 MED ORDER — FENTANYL 75 MCG/HR TD PT72: 1.0000 | MEDICATED_PATCH | TRANSDERMAL | 0 refills | Status: DC

## 2020-08-15 ENCOUNTER — Other Ambulatory Visit: Payer: Self-pay | Admitting: Physical Medicine and Rehabilitation
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
## 2020-08-20 ENCOUNTER — Ambulatory Visit (INDEPENDENT_AMBULATORY_CARE_PROVIDER_SITE_OTHER): Payer: Self-pay | Admitting: Gastroenterology

## 2020-08-20 ENCOUNTER — Encounter: Payer: Self-pay | Admitting: Gastroenterology

## 2020-08-20 VITALS — BP 113/64 | HR 87 | Ht 75.0 in | Wt 180.0 lb

## 2020-08-20 DIAGNOSIS — K224 Dyskinesia of esophagus: Secondary | ICD-10-CM

## 2020-08-20 DIAGNOSIS — R1314 Dysphagia, pharyngoesophageal phase: Secondary | ICD-10-CM

## 2020-08-20 DIAGNOSIS — K5909 Other constipation: Secondary | ICD-10-CM

## 2020-08-20 NOTE — Progress Notes (Signed)
[facility] Consult Note:  History: Christopher [name] 08/20/2020  Referring provider: Vivi Barrack, MD  Reason for consult/chief complaint: Dysphagia (" I can swallow most liquids and solids okay." I have been told to set up for 30-40 mins after eating. Barium Swallow with tablet was performed early this year. ) and Constipation (Increased constipation and stomach pain after eating. )   Subjective  HPI:  This is a 55 year old man accompanied by his wife and his Worker's Compensation case nurse, referred by Dr. Jerline Pain primary care for dysphagia and concern of aspiration.  Extensive office note from physical med rehab clinic a month ago reviewed.  Note includes the following: Pt is a 55 yr old male with hx of Multitrauma- causing L femoral neck fx, degloving of L hip to going/scrotum, bladder neck trauma- got SPC, developed compartment syndrome -s/p surgery for that; also diverting colostomy, skin grafts, and s/p trach and PEG- PEG is out. 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 S/P screw removal and on IV ABX for L hip osteomyelitis. 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  B/L tennis elbow new-and B/L pending/forming ulnar neuropathy Osteomyelitis of L hip found again-on  PO vanc and Cipro"  Patient is on chronic opioids for pain syndrome. He had a PEG placement in 2020 when hospitalized for trauma, it has since been removed. Hospitalization for community-acquired pneumonia 2 months ago.   From primary care clinic note dated 07/02/2020, the day of this referral: "Respiratory failure with hypoxia [facility]) Patient now on 3 L of supplemental oxygen.  O2 sats at home in upper 80s if he gets off of his oxygen.  Concern that chronic aspiration could be contributing.  Also has a history of  VTE which could be contributing. Given his new oxygen  requirement will place referral to pulmonology for further testing.   He has albuterol to use as needed.  We will also continue his current supplemental oxygen.   Dysphagia Concern for possible aspiration.  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  Will place referral to GI for further evaluation.  Has never had endoscopy."  Patient reportedly had a speech evaluation in the hospital and aspiration precautions were discussed. _________________________________________________________  Christopher [name] and his wife report frequent cough, sometimes dry other times productive.  He has dysphagia to pills, especially larger ones like his muscle relaxant.  He might cough while eating or drinking, but he coughs other times as well.  His appetite is reportedly fair.  He has abdominal bloating and intermittent abdominal pain, particularly if having more constipation.  Movantik seems to help his OIC, but his wife also uses MiraLAX.  She feels that the Movantik increases rectal mucus production and therefore causes skin irritation and breakdown in the perianal area.  This is made worse by Christopher [name] being essentially bedbound from his chronic pain.  They describe him having had a tracheostomy for perhaps 6 months after the initial hospitalization.  He was not on chronic oxygen until his pneumonia about 2 months ago.  His dysphagia seem to worsen at that point.  He did recall having had a modified barium study and formal speech evaluation in 2020 after the trauma.  Those records were found and reviewed, showing poor pharyngeal esophageal swallow function.  ROS:  Review of Systems  Constitutional: Negative for appetite change and unexpected weight  change.  HENT: Negative for mouth sores and voice change.   Eyes: Negative for pain and redness.  Respiratory: Positive for cough. Negative for shortness of breath.   Cardiovascular: Negative for chest pain and palpitations.  Genitourinary: Negative for dysuria  and hematuria.  Musculoskeletal: Negative for arthralgias and myalgias.       Diffuse chronic pain and muscle spasm  Skin: Negative for pallor and rash.  Neurological: Negative for weakness and headaches.  Hematological: Negative for adenopathy.     Past Medical History: Past Medical History:  Diagnosis Date  . Acute on chronic respiratory failure with hypoxia ([place]) 06/2018   trach removed 11-16-2018, on vent from jan until may 2020 - uses albuterol prn  . Anxiety   . Bacteremia due to Pseudomonas 06/2018  . Chronic osteomyelitis ([place])   . Chronic pain syndrome   . Clostridium difficile colitis 10/30/2019   tx with abx   . Depression   . DVT (deep venous thrombosis) ([place]) 2020   right brachial post PICC line  . History of blood transfusion 06/2018  . History of Clostridioides difficile colitis   . History of kidney stones   . Hypertension    norvasc d/c by pcp on 11/05/19  . Multiple traumatic injuries   . Penile pain 11/18/2019  . Pneumonia 11/2009   2020 x 2  . Gilford Rile as ambulation aid   . Wheelchair bound    electric  . Wound discharge    left hip wound with bloody/clear drainage change dressing q day surgilube with gauze, between legs wound using calcium algenate pad bid     Past Surgical History: Past Surgical History:  Procedure Laterality Date  . APPLICATION OF A-CELL OF BACK N/A 08/06/2018   Procedure: Application Of A-Cell Of Back;  Surgeon: [name], DO;  Location: [place];  Service: Plastics;  Laterality: N/A;  . APPLICATION OF A-CELL OF EXTREMITY Left 08/06/2018   Procedure: Application Of A-Cell Of Extremity;  Surgeon: [name], DO;  Location: [place];  Service: Plastics;  Laterality: Left;  . APPLICATION OF A-CELL OF EXTREMITY Left 09/18/2019   Procedure: APPLICATION OF A-CELL OF EXTREMITY;  Surgeon: [name], DO;  Location: [place];  Service: Plastics;  Laterality: Left;  . APPLICATION OF WOUND VAC  07/12/2018   Procedure:  Application Of Wound Vac to the Left Thigh and Scrotum.;  Surgeon: Shona Needles, MD;  Location: [place];  Service: Orthopedics;;  . APPLICATION OF WOUND VAC  07/10/2018   Procedure: Application Of Wound Vac;  Surgeon: Clovis Riley, MD;  Location: [place];  Service: General;;  . COLON SURGERY  2020   colostomy  . COLOSTOMY N/A 07/23/2018   Procedure: COLOSTOMY;  Surgeon: Georganna Skeans, MD;  Location: [place];  Service: General;  Laterality: N/A;  . CYSTOSCOPY W/ URETERAL STENT PLACEMENT N/A 07/15/2018   Procedure: RETROGRADE URETHROGRAM;  Surgeon: Franchot Gallo, MD;  Location: [place];  Service: Urology;  Laterality: N/A;  . CYSTOSCOPY WITH LITHOLAPAXY N/A 05/06/2019   Procedure: CYSTOSCOPY BASKET BLADDER STONE EXTRACTION;  Surgeon: Cleon Gustin, MD;  Location: [facility];  Service: Urology;  Laterality: N/A;  30 MINS  . CYSTOSTOMY N/A 05/06/2019   Procedure: REPLACEMENT OF SUPRAPUBIC CATHETER;  Surgeon: Cleon Gustin, MD;  Location: [facility];  Service: Urology;  Laterality: N/A;  . DEBRIDEMENT AND CLOSURE WOUND Left 03/04/2019   Procedure: Excision of hip wound with placement of Acell;  Surgeon: Marla Roe,  Loel Lofty, DO;  Location: [place];  Service: Plastics;  Laterality: Left;  . ESOPHAGOGASTRODUODENOSCOPY N/A 08/14/2018   Procedure: ESOPHAGOGASTRODUODENOSCOPY (EGD);  Surgeon: Georganna Skeans, MD;  Location: [place];  Service: General;  Laterality: N/A;  bedside  . FACIAL RECONSTRUCTION SURGERY     X 2--once as a teenager and second time in his 16's  . HARDWARE REMOVAL Left 03/04/2019   Procedure: Left Hip Hardware Removal;  Surgeon: Shona Needles, MD;  Location: [place];  Service: Orthopedics;  Laterality: Left;  . HIP PINNING,CANNULATED Left 07/12/2018   Procedure: CANNULATED HIP PINNING;  Surgeon: Shona Needles, MD;  Location: [place];  Service: Orthopedics;  Laterality: Left;  . HIP SURGERY    . HOLMIUM LASER APPLICATION Right 7/0/2637    Procedure: HOLMIUM LASER APPLICATION;  Surgeon: Cleon Gustin, MD;  Location: [facility];  Service: Urology;  Laterality: Right;  . I & D EXTREMITY Left 07/25/2018   Procedure: Debridement of buttock, scrotum and left leg, placement of acell and vac;  Surgeon: [name], DO;  Location: [place];  Service: Plastics;  Laterality: Left;  . I & D EXTREMITY N/A 08/06/2018   Procedure: Debridement of buttock, scrotum and left leg;  Surgeon: [name], DO;  Location: [place];  Service: Plastics;  Laterality: N/A;  . I & D EXTREMITY N/A 08/13/2018   Procedure: Debridement of buttock, scrotum and left leg, placement of acell and vac;  Surgeon: [name], DO;  Location: [place];  Service: Plastics;  Laterality: N/A;  90 min, please  . INCISION AND DRAINAGE HIP Left 09/18/2019   Procedure: IRRIGATION AND DEBRIDEMENT HIP/ PELVIS WITH WOUND VAC PLACEMENT;  Surgeon: Shona Needles, MD;  Location: [place];  Service: Orthopedics;  Laterality: Left;  . INCISION AND DRAINAGE OF WOUND N/A 07/18/2018   Procedure: Debridement of left leg, buttocks and scrotal wound with placement of acell and Flexiseal;  Surgeon: [name], DO;  Location: [place];  Service: Plastics;  Laterality: N/A;  . INCISION AND DRAINAGE OF WOUND Left 08/29/2018   Procedure: Debridement of buttock, scrotum and left leg, placement of acell and vac;  Surgeon: [name], DO;  Location: [place];  Service: Plastics;  Laterality: Left;  75 min, please  . INCISION AND DRAINAGE OF WOUND Bilateral 10/23/2018   Procedure: DEBRIDEMENT OF BUTTOCK,SCROTUM, AND LEG WOUNDS WITH PLACEMENT OF ACELL- BILATERAL 90 MIN;  Surgeon: [name], DO;  Location: [place];  Service: Plastics;  Laterality: Bilateral;  . IR ANGIOGRAM PELVIS SELECTIVE OR SUPRASELECTIVE  07/10/2018  . IR ANGIOGRAM PELVIS SELECTIVE OR SUPRASELECTIVE  07/10/2018  . IR ANGIOGRAM SELECTIVE EACH ADDITIONAL VESSEL  07/10/2018  . IR EMBO ART   VEN HEMORR LYMPH EXTRAV  INC GUIDE ROADMAPPING  07/10/2018  . IR NEPHROSTOMY PLACEMENT LEFT  04/05/2019  . IR NEPHROSTOMY PLACEMENT RIGHT  05/31/2019  . IR US GUIDE BX ASP/DRAIN  07/10/2018  . IR US GUIDE VASC ACCESS RIGHT  07/10/2018  . IR VENO/EXT/UNI LEFT  07/10/2018  . IRRIGATION AND DEBRIDEMENT OF WOUND WITH SPLIT THICKNESS SKIN GRAFT Left 09/19/2018   Procedure: Debridement of gluteal wound with placement of acell to left leg.;  Surgeon: [name], DO;  Location: [place];  Service: Plastics;  Laterality: Left;  2.5 hours, please  . LAPAROTOMY N/A 07/12/2018   Procedure: EXPLORATORY LAPAROTOMY;  Surgeon: Georganna Skeans, MD;  Location: [place];  Service: General;  Laterality: N/A;  . LAPAROTOMY N/A  07/15/2018   Procedure: WOUND EXPLORATION; CLOSURE OF ABDOMEN;  Surgeon: Georganna Skeans, MD;  Location: [place];  Service: General;  Laterality: N/A;  . LAPAROTOMY  07/10/2018   Procedure: Exploratory Laparotomy;  Surgeon: Clovis Riley, MD;  Location: [place];  Service: General;;  . MASS EXCISION Left 09/18/2019   Procedure: EXCISION UPPER LEFT INNER THIGH WOUND;  Surgeon: [name], DO;  Location: [place];  Service: Plastics;  Laterality: Left;  . NEPHROLITHOTOMY Right 07/15/2019   Procedure: NEPHROLITHOTOMY PERCUTANEOUS;  Surgeon: Cleon Gustin, MD;  Location: [facility];  Service: Urology;  Laterality: Right;  90 MINS  . PEG PLACEMENT N/A 08/14/2018   Procedure: PERCUTANEOUS ENDOSCOPIC GASTROSTOMY (PEG) PLACEMENT;  Surgeon: Georganna Skeans, MD;  Location: [place];  Service: General;  Laterality: N/A;  . PERCUTANEOUS TRACHEOSTOMY N/A 08/02/2018   Procedure: PERCUTANEOUS TRACHEOSTOMY;  Surgeon: Georganna Skeans, MD;  Location: [place];  Service: General;  Laterality: N/A;  . RADIOLOGY WITH ANESTHESIA N/A 07/10/2018   Procedure: IR WITH ANESTHESIA;  Surgeon: Sandi Mariscal, MD;  Location: [place];  Service: Radiology;  Laterality: N/A;  . RADIOLOGY WITH ANESTHESIA Right  07/10/2018   Procedure: Ir With Anesthesia;  Surgeon: Sandi Mariscal, MD;  Location: [place];  Service: Radiology;  Laterality: Right;  . SCROTAL EXPLORATION N/A 07/15/2018   Procedure: SCROTUM DEBRIDEMENT;  Surgeon: Franchot Gallo, MD;  Location: [place];  Service: Urology;  Laterality: N/A;  . SHOULDER SURGERY    . SKIN SPLIT GRAFT Right 09/19/2018   Procedure: Skin Graft Split Thickness;  Surgeon: [name], DO;  Location: [place];  Service: Plastics;  Laterality: Right;  . SKIN SPLIT GRAFT N/A 10/03/2018   Procedure: Split thickness skin graft to gluteal area with acell placement;  Surgeon: [name], DO;  Location: [place];  Service: Plastics;  Laterality: N/A;  3 hours, please  . VACUUM ASSISTED CLOSURE CHANGE N/A 07/12/2018   Procedure: ABDOMINAL VACUUM ASSISTED CLOSURE CHANGE and abdominal washout;  Surgeon: Georganna Skeans, MD;  Location: [place];  Service: General;  Laterality: N/A;  . WOUND DEBRIDEMENT Left 07/23/2018   Procedure: DEBRIDEMENT LEFT BUTTOCK  WOUND;  Surgeon: Georganna Skeans, MD;  Location: [place];  Service: General;  Laterality: Left;  . WOUND EXPLORATION Left 07/10/2018   Procedure: WOUND EXPLORATION LEFT GROIN;  Surgeon: Clovis Riley, MD;  Location: [facility];  Service: General;  Laterality: Left;     Family History: Family History  Problem Relation Age of Onset  . Breast cancer Mother        with mets to the bones    Social History: Social History   Socioeconomic History  . Marital status: Married    Spouse name: Not on file  . Number of children: Not on file  . Years of education: Not on file  . Highest education level: Not on file  Occupational History  . Occupation: Disable  Tobacco Use  . Smoking status: Former Smoker    Packs/day: 1.00    Years: 20.00    Pack years: 20.00    Types: Cigarettes    Quit date: 07/10/2018    Years since quitting: 2.1  . Smokeless tobacco: Never Used  Vaping Use  . Vaping Use: Never used  Substance and  Sexual Activity  . Alcohol use: Never  . Drug use: Yes    Types: Oxycodone, Fentanyl    Comment: Fentanyl patch/oxycodone since 06/2018  . Sexual activity: Yes  Other Topics Concern  .  Not on file  Social History Narrative   ** Merged History Encounter **       Social Determinants of Health   Financial Resource Strain: Not on file  Food Insecurity: Not on file  Transportation Needs: Not on file  Physical Activity: Not on file  Stress: Not on file  Social Connections: Not on file    Allergies: Allergies  Allergen Reactions  . Methadone Other (See Comments)    Hallucinations/confusion    Outpatient Meds: Current Outpatient Medications  Medication Sig Dispense Refill  . albuterol (VENTOLIN HFA) 108 (90 Base) MCG/ACT inhaler TAKE 2 PUFFS BY MOUTH EVERY 6 HOURS AS NEEDED FOR WHEEZE OR SHORTNESS OF BREATH 18 g 1  . Amino Acids-Protein Hydrolys (FEEDING SUPPLEMENT, PRO-STAT 64,) LIQD Take 30 mLs by mouth 3 (three) times daily with meals. 887 mL 0  . Blood Pressure Monitoring (BLOOD PRESSURE CUFF) MISC Use daily as needed to check blood pressure. 1 each 0  . ciprofloxacin (CIPRO) 500 MG tablet Take 1 tablet (500 mg total) by mouth 2 (two) times daily. 60 tablet 11  . clonazePAM (KLONOPIN) 0.5 MG tablet TAKE 0.5 TABLETS (0.25 MG TOTAL) BY MOUTH 3 (THREE) TIMES DAILY AS NEEDED FOR ANXIETY. 50 tablet 5  . dextromethorphan-guaiFENesin (MUCINEX DM) 30-600 MG 12hr tablet Take 2 tablets by mouth 2 (two) times daily.    Marland Kitchen dronabinol (MARINOL) 2.5 MG capsule TAKE 1 CAPSULE (2.5 MG TOTAL) BY MOUTH 2 (TWO) TIMES DAILY BEFORE A MEAL. (Patient taking differently: Take 2.5 mg by mouth 2 (two) times daily as needed (to encourage appetite).) 60 capsule 5  . famotidine (PEPCID) 20 MG tablet Take 20 mg by mouth daily.    . fentaNYL (DURAGESIC) 75 MCG/HR Place 1 patch onto the skin every other day. 15 patch 0  . gabapentin (NEURONTIN) 300 MG capsule Take 1 capsule (300 mg total) by mouth 3 (three) times  daily. 270 capsule 3  . lidocaine (LIDODERM) 5 % PLACE 3 PATCHES ONTO THE SKIN EVERY 12 HOURS. REMOVE & DISCARD PATCH WITHIN 12 HOURS OR AS DIRECTED BY MD 90 patch 5  . melatonin ([employer] MELATONIN) 3 MG TABS tablet Take 1 tablet (3 mg total) by mouth at bedtime. 10 tablet 0  . methocarbamol (ROBAXIN) 500 MG tablet TAKE 1 TABLET BY MOUTH EVERY 6 HOURS AS NEEDED FOR MUSCLE SPASMS (Patient taking differently: Take by mouth every 6 (six) hours.) 100 tablet 11  . mirabegron ER (MYRBETRIQ) 50 MG TB24 tablet Take 50 mg by mouth daily.    . Multiple Vitamin (MULTIVITAMIN WITH MINERALS) TABS tablet Take 1 tablet by mouth daily.    . naloxegol oxalate (MOVANTIK) 12.5 MG TABS tablet Take 1 tablet (12.5 mg total) by mouth daily. 30 tablet 11  . naloxone (NARCAN) nasal spray 4 mg/0.1 mL To use if pt develops unconsciousness or confusion that family thinks is related to opioids. (Patient taking differently: Place 1 spray into the nose daily as needed ("To [place] if pt develops unconsciousness or confusion that family thinks is related to opiods").) 1 each 3  . ondansetron (ZOFRAN) 8 MG tablet Take 1 tablet (8 mg total) by mouth every 8 (eight) hours as needed for nausea, vomiting or refractory nausea / vomiting (max dose in a day is 24 mg total). 120 tablet 5  . oxybutynin (DITROPAN) 5 MG tablet Take 5 mg by mouth See admin instructions. Take 5 mg by mouth three times a day and an additional 5 mg once daily as needed for  urinary urgency    . oxyCODONE-acetaminophen (PERCOCET) 10-325 MG tablet Take 1 tablet by mouth every 3 (three) hours as needed (breakthrough pain). 180 tablet 0  . PARoxetine (PAXIL) 40 MG tablet TAKE 1 TABLET BY MOUTH EVERYDAY AT BEDTIME - PA IN PROGRESS (Patient taking differently: Take 40 mg by mouth at bedtime.) 90 tablet 2  . Probiotic Product (PROBIOTIC COLON SUPPORT) CAPS Take 1 capsule by mouth daily.    . promethazine (PHENERGAN) 12.5 MG tablet Take 1 tablet (12.5 mg total) by mouth every 6 (six)  hours as needed for nausea, vomiting or refractory nausea / vomiting. 90 tablet 5  . Respiratory Therapy Supplies (SPIROMETER) KIT Use 3-4 times per hour. 1 kit 0  . senna-docusate (SENOKOT-S) 8.6-50 MG tablet Take 2 tablets by mouth See admin instructions. Takes 2 in the morning and 2 at night.    . traZODone (DESYREL) 100 MG tablet TAKE 1-2 TABLETS (100-200 MG TOTAL) BY MOUTH AT BEDTIME. 180 tablet 2  . vancomycin (VANCOCIN) 125 MG capsule Take 1 capsule (125 mg total) by mouth 4 (four) times daily. 120 capsule 11   No current facility-administered medications for this visit.      ___________________________________________________________________ Objective   Exam:  BP 113/64   Pulse 87   Ht _0  (1.905 m)   Wt 180 lb (81.6 kg)   SpO2 99%   BMI 22.50 kg/m  Wt Readings from Last 3 Encounters:  08/20/20 180 lb (81.6 kg)  06/19/20 180 lb (81.6 kg)  05/15/20 180 lb (81.6 kg)     General: Chronically ill-appearing man in a motorized wheelchair, poor muscle mass.  He is alert and oriented, conversational with a depressed affect.  He is clearly saddened by his condition.  Voice soft, no hoarseness  Eyes: sclera anicteric, no redness,  ENT: oral mucosa moist without lesions, no cervical or supraclavicular lymphadenopathy, fair dentition.  Tracheostomy scar  CV: RRR without murmur, S1/S2, no JVD, no peripheral edema  Resp: Limited exam in wheelchair, no wheezing appreciated.  Good air entry bilaterally  GI: Multiple surgical scars, some of which appears to have healed by secondary intention.  Left abdominal colostomy with healthy appearing mucosa.  Suprapubic catheter draining to a bag with yellow urine  Skin; warm and dry, no rash or jaundice noted  Neuro: awake, alert and oriented x 3. Normal gross motor function and fluent speech  Labs:  CBC Latest Ref Rng & Units 07/31/2020 06/24/2020 06/23/2020  WBC 4.0 - 10.5 K/uL 6.2 4.3 4.2  Hemoglobin 13.0 - 17.0 g/dL 13.4 11.0(L)  11.0(L)  Hematocrit 39.0 - 52.0 % 41.3 35.8(L) 35.1(L)  Platelets 150 - 400 K/uL 252 250 242   CMP Latest Ref Rng & Units 07/31/2020 06/24/2020 06/23/2020  Glucose 70 - 99 mg/dL 100(H) 72 68(L)  BUN 6 - 20 mg/dL 14 5(L) 10  Creatinine 0.61 - 1.24 mg/dL 0.76 0.56(L) 0.62  Sodium 135 - 145 mmol/L 137 138 140  Potassium 3.5 - 5.1 mmol/L 3.7 3.4(L) 3.8  Chloride 98 - 111 mmol/L 102 101 103  CO2 22 - 32 mmol/L _1 Calcium 8.9 - 10.3 mg/dL 10.2 9.2 9.2  Total Protein 6.5 - 8.1 g/dL 8.0 - -  Total Bilirubin 0.3 - 1.2 mg/dL 1.2 - -  Alkaline Phos 38 - 126 U/L 194(H) - -  AST 15 - 41 U/L 29 - -  ALT 0 - 44 U/L 53(H) - -     Radiologic Studies:  Barium swallow done  during hospitalization on 06/23/2020:  CLINICAL DATA:  Dysmotility.   EXAM: ESOPHOGRAM/BARIUM SWALLOW   TECHNIQUE: Single contrast examination was performed using thin barium. Exam is somewhat limited as the patient could not stand up and imaging had to be performed with the patient in a right oblique position.   FLUOROSCOPY TIME:  Radiation Exposure Index (if provided by the fluoroscopic device): 35.1 mGy.   COMPARISON:  None.   FINDINGS: No mass or stricture is noted in the esophagus. No hiatal hernia or reflux is noted. Mild tertiary contractions are noted suggesting presbyesophagus.   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 No mass or stricture is noted.     Electronically Signed   By: Marijo Conception M.D.   On: 06/23/2020 16:55   Assessment: Encounter Diagnoses  Name Primary?  . Pharyngoesophageal dysphagia Yes  . Chronic constipation   . Esophageal dysmotility     This is an unfortunate man with multiple chronic medical problems and debility after major trauma, see me for dysphagia and question of aspiration leading to a recent pneumonia. He also has chronic constipation that is combination of chronic opioid use and relative immobility causing decreased GI  motility.  His overall clinical picture and recent barium swallow findings indicate that this is probably a global esophageal dysmotility, but more clinically relevant is pharyngeal esophageal dysmotility/UES dysfunction.  I believe that is a combination of history of prolonged tracheostomy and general debility, with a probable lesser component of chronic opioid use. While this certainly increases his risk of aspiration, I do not think there is any way of knowing to what extent he may be aspirating and if that caused his pneumonia, or what the risk of ongoing pneumonia may be. I do not see a role for upper endoscopy at this point, since there is no fixed stricture seen on the barium study.  As it would therefore not be expected to be a therapeutic procedure, and would certainly be high risk for sedation given his condition, I advise no upper endoscopy at this point  I believe he would benefit from a reevaluation by the speech and language pathology clinic, and they might feel the need for MBS to further characterize his swallow function and risk of aspiration.  Perhaps they may have compensatory mechanisms such as chin tuck maneuver, modified diet or changing some of his meds to other forms to therefore aid with swallowing and decrease the risk of aspiration.  Beyond that, I do not have any other therapeutic solutions at this point. If he were to have recurrent aspiration felt to be likely related to UES dysfunction, consideration could be given to placing another PEG.  However, that would only be helpful in that scenario if he were to then get all nutrition and meds through the PEG and therefore become totally n.p.o.  Doing so might be an overall decline in his quality of life.  They are managing his OIC best they can, and I have no other recommendations in that regard at this point.  I would be glad to see him as needed.  (Complex evaluation, extensive chart review required and discussion with patient  and caregivers.)  Nelida Meuse III  CC: Referring provider noted above

## 2020-08-20 NOTE — Patient Instructions (Addendum)
If you are age 54 or older, your body mass index should be between 23-30. Your Body mass index is 22.5 kg/m. If this is out of the aforementioned range listed, please consider follow up with your Primary Care Provider.  If you are age 18 or younger, your body mass index should be between 19-25. Your Body mass index is 22.5 kg/m. If this is out of the aformentioned range listed, please consider follow up with your Primary Care Provider.    You are being referred to a Speech Pathologist. Please allow up to 2 weeks for them to contact you to schedule an appointment.  Follow up as needed.  It was a pleasure to see you today!  Dr. Myrtie Neither

## 2020-08-21 ENCOUNTER — Other Ambulatory Visit: Payer: Self-pay

## 2020-08-21 ENCOUNTER — Encounter: Payer: Self-pay | Admitting: Surgical

## 2020-08-21 ENCOUNTER — Ambulatory Visit (INDEPENDENT_AMBULATORY_CARE_PROVIDER_SITE_OTHER): Payer: No Typology Code available for payment source | Admitting: Surgical

## 2020-08-21 VITALS — BP 120/86 | HR 91

## 2020-08-21 DIAGNOSIS — T07XXXA Unspecified multiple injuries, initial encounter: Secondary | ICD-10-CM | POA: Diagnosis not present

## 2020-08-21 DIAGNOSIS — L98499 Non-pressure chronic ulcer of skin of other sites with unspecified severity: Secondary | ICD-10-CM | POA: Diagnosis not present

## 2020-08-21 IMAGING — RF DG RETROGRADE-URETHROGRAM
1 series · 5 of 5 positions shown · non-contrast
Comparison: None.

CLINICAL DATA: History of scrotal degloving

EXAM:
INTRAOPERATIVE RETROGRADE UROGRAPHY
TECHNIQUE: Images were obtained with the C-arm fluoroscopic device
intraoperatively and submitted for interpretation post-operatively.
Please see the procedural report for the amount of contrast and the
fluoroscopy time utilized.

[Series 1: run · 2 acquisitions, 5 frames shown]
[im 1/2]
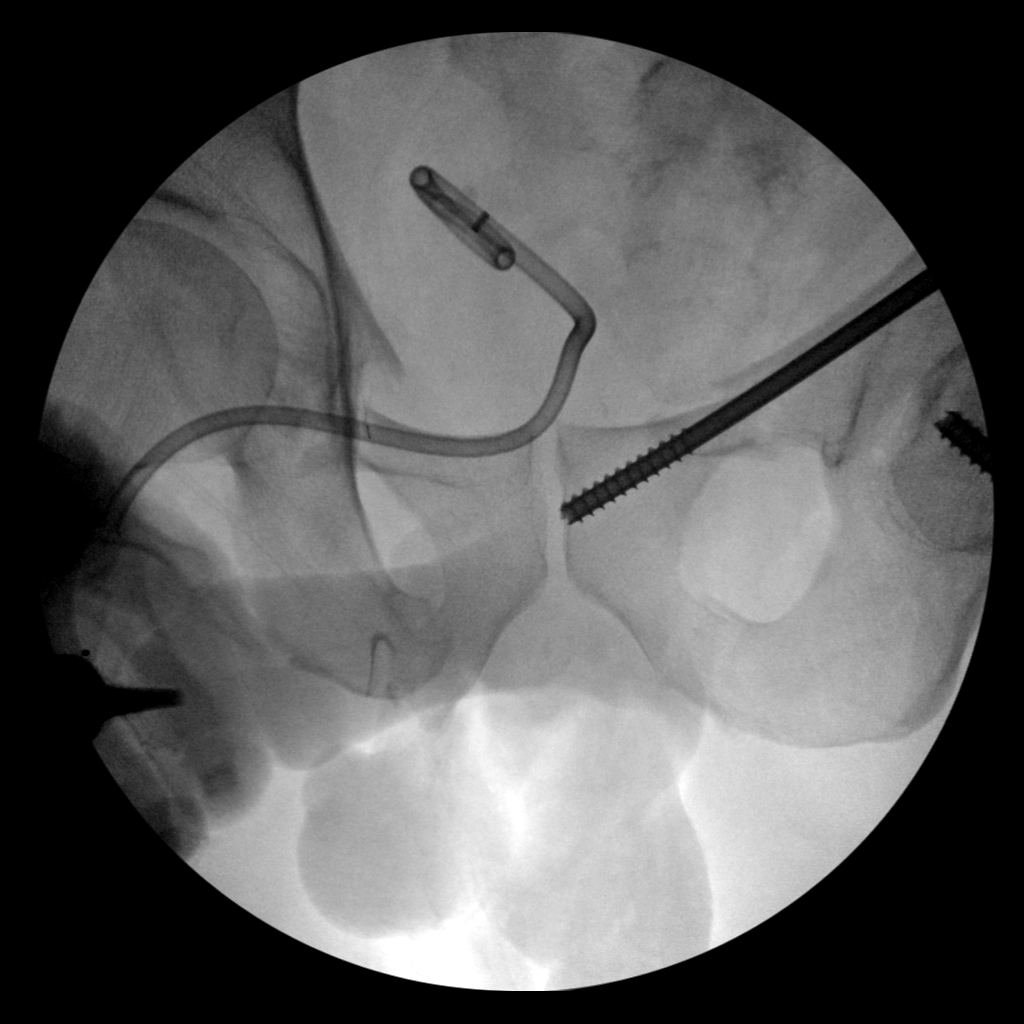
[im 1/2]
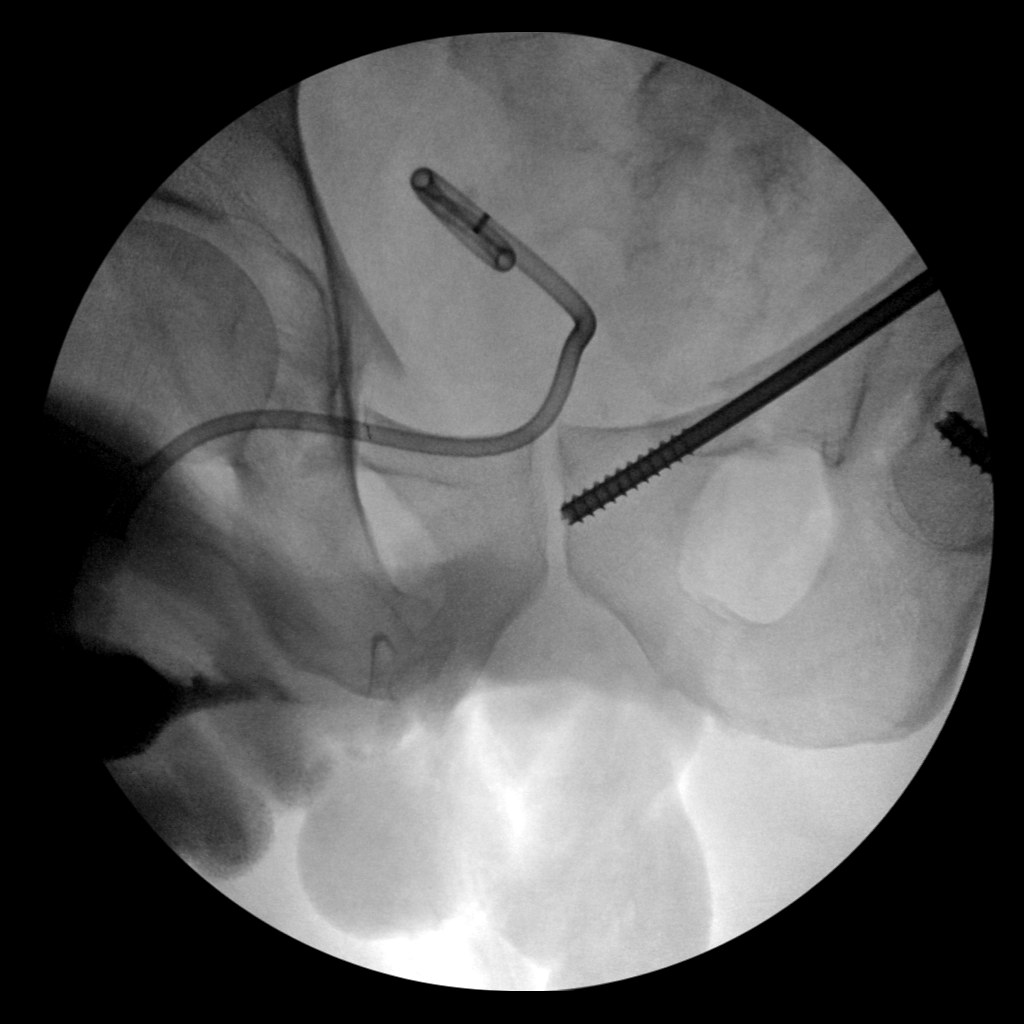
[im 1/2]
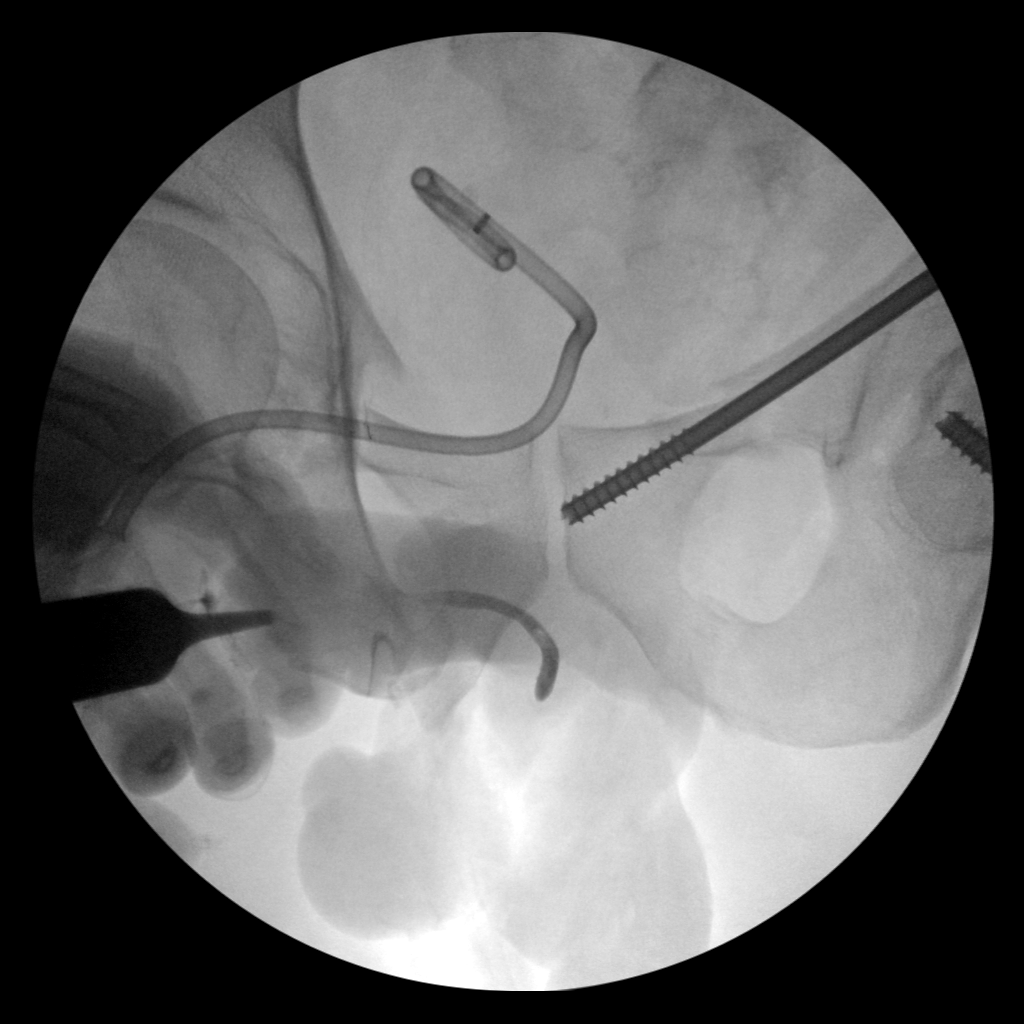
[im 1/2]
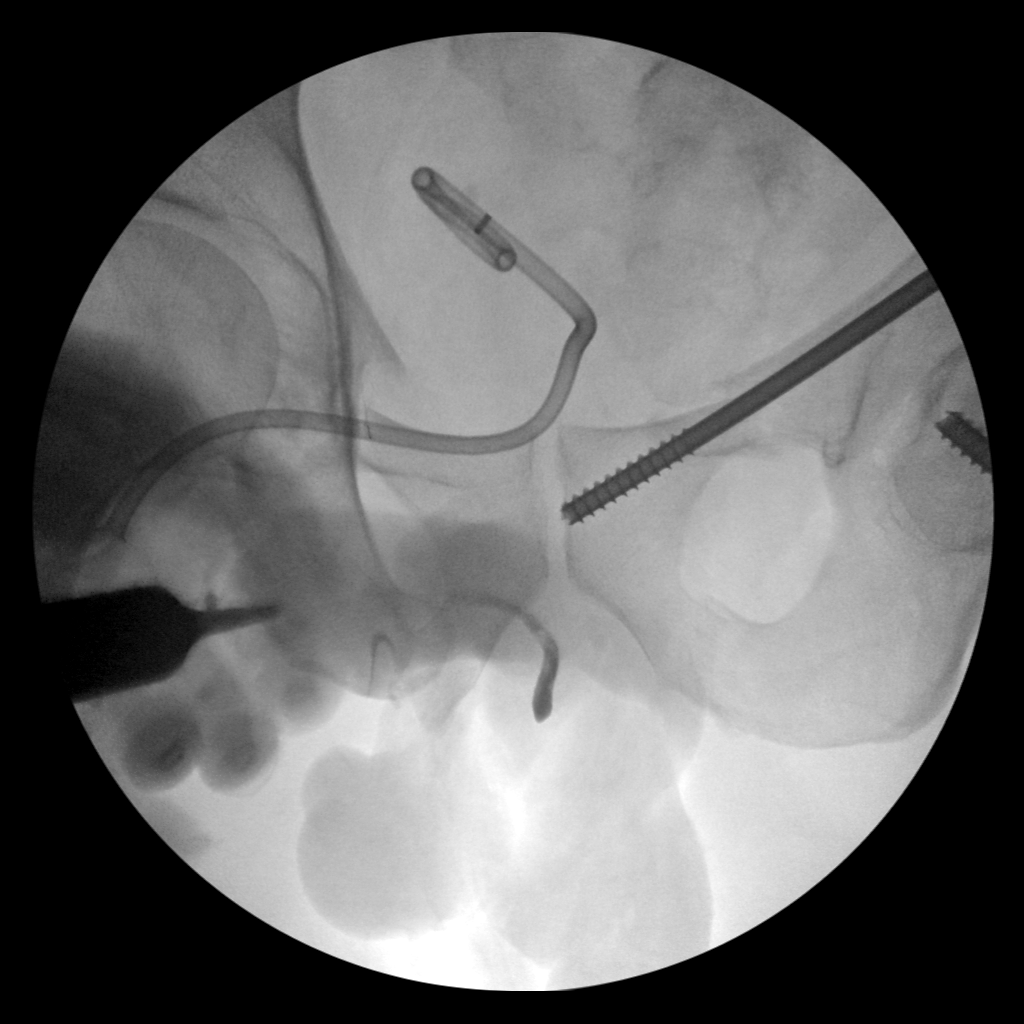
[im 2/2]
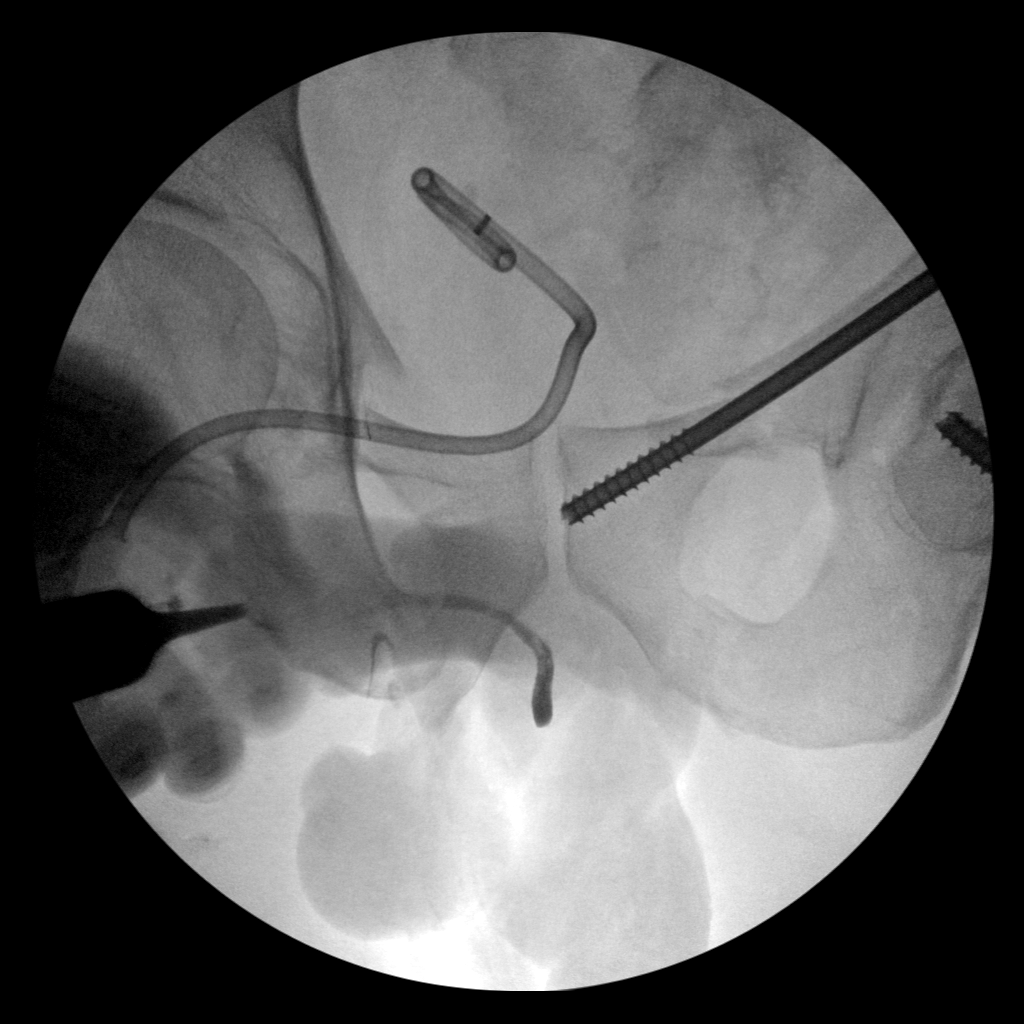

[5 of 5 positions shown; findings below may reference images not displayed]

FINDINGS: Retrograde urethrogram was performed and shows visualization of
penile urethra. No extension of contrast into the prostatic urethra
and urinary bladder is noted.
IMPRESSION: Visualization of the penile urethra with no passage of contrast
material into the urinary bladder. Correlation with the operative
findings is recommended.
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
## 2020-08-21 NOTE — Addendum Note (Signed)
Addended by: Arville Care on: 08/21/2020 03:39 PM   Modules accepted: Orders

## 2020-08-22 IMAGING — DX DG CHEST 1V PORT
1 series · 1 of 1 positions shown · non-contrast
Comparison: 07/13/2018

CLINICAL DATA: Check endotracheal tube placement

EXAM:
PORTABLE CHEST 1 VIEW

[chest ap]
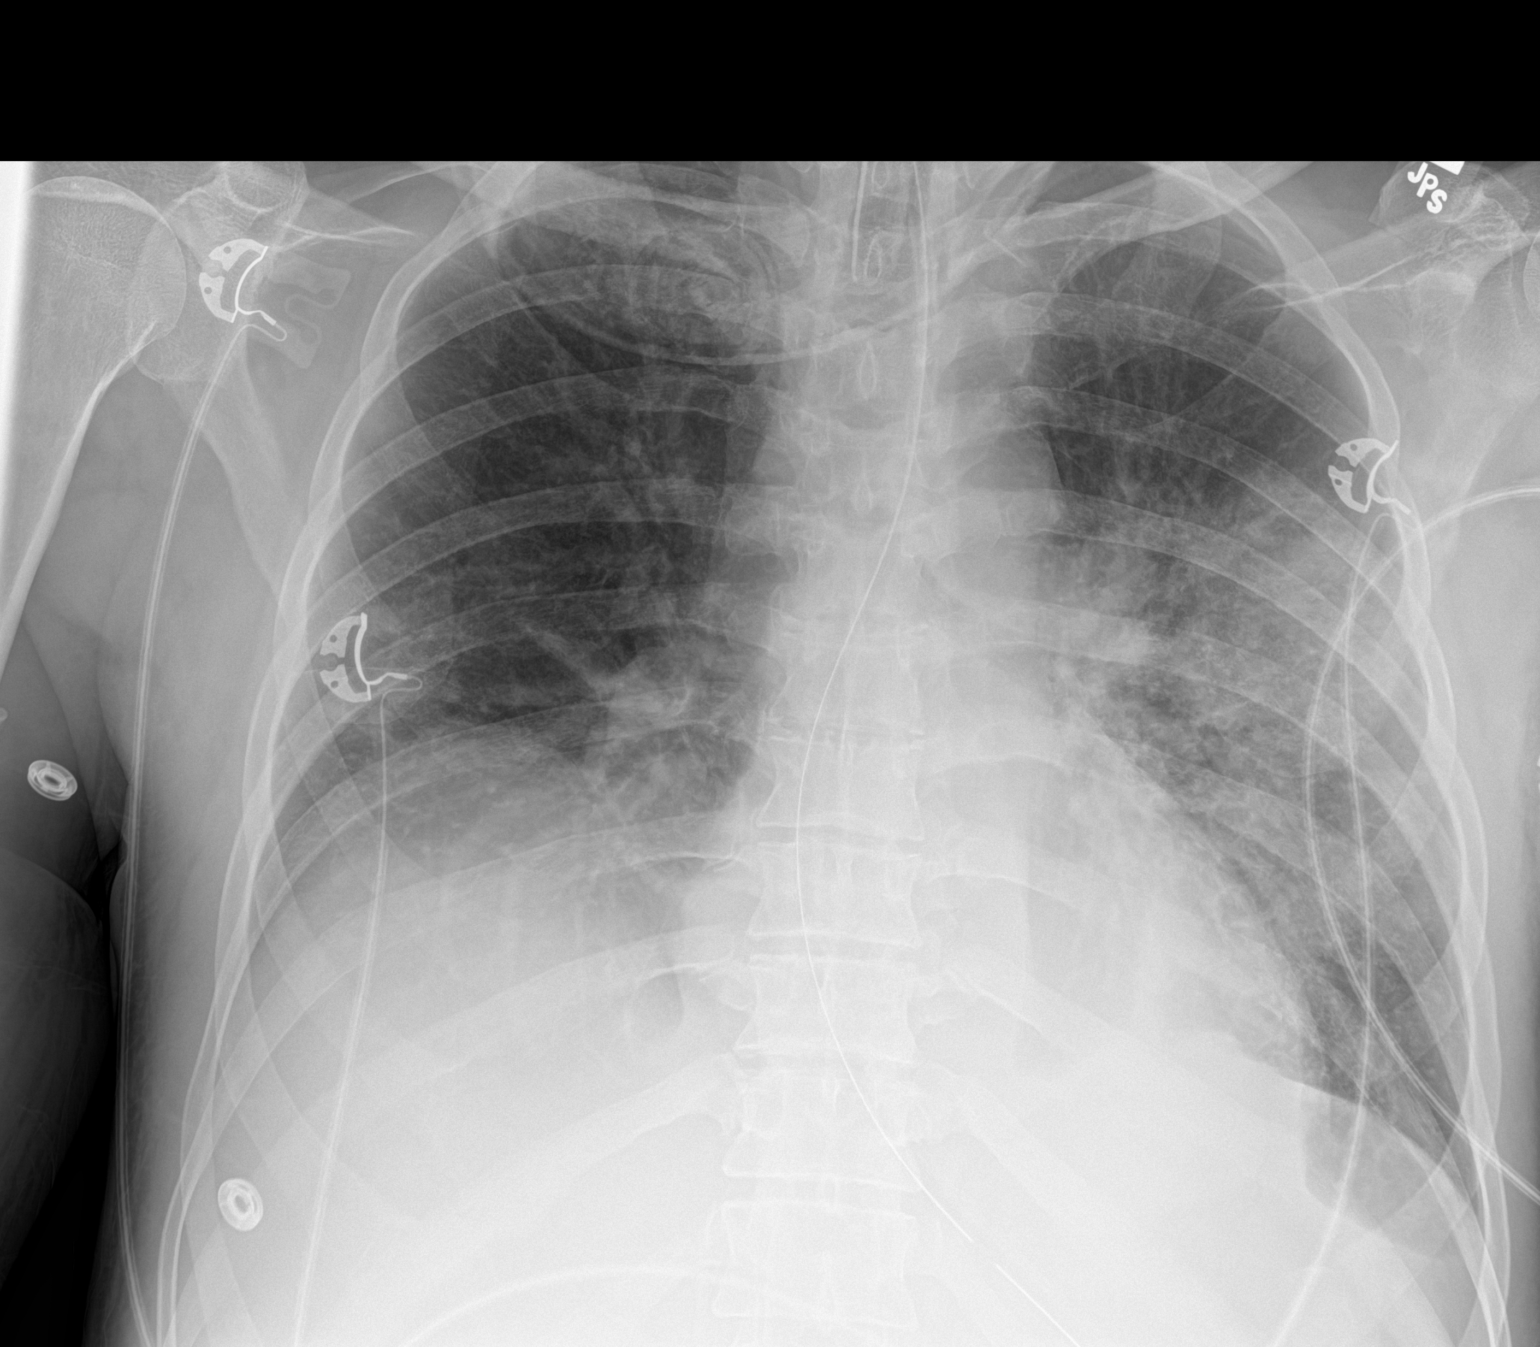

[1 of 1 positions shown; findings below may reference images not displayed]

FINDINGS: Endotracheal tube and gastric catheter are again noted and stable.
Increasing density is noted in the left mid and lower lung
consistent with evolving infiltrate given the remote timing from the
prior trauma. No pneumothorax is seen. No sizable effusion is noted.
Known left rib fractures are not well appreciated.
IMPRESSION: Tubes and lines as described above.

Increasing infiltrative density in the left mid and lower lung
compared with the prior exams.

## 2020-08-24 ENCOUNTER — Telehealth: Payer: Self-pay

## 2020-08-24 NOTE — Telephone Encounter (Signed)
Received PA request for patient's ciprofloxacin. Wife reports that the [facility] where [facility] didn't have it in stock and it was called into [facility] in [place]. She states that they don't have insurance and they're using workers comp to cover medication. She stated she would call their case manager to begin the paperwork to get PA approved as they have the information and our office does not. Instructed to call us with any questions.  Shawndell Schillaci Loyola Mast, RN

## 2020-08-27 ENCOUNTER — Other Ambulatory Visit: Payer: Self-pay | Admitting: Family Medicine

## 2020-08-27 ENCOUNTER — Telehealth: Payer: Self-pay | Admitting: *Deleted

## 2020-08-27 ENCOUNTER — Telehealth: Payer: Self-pay

## 2020-08-27 MED ORDER — OXYCODONE-ACETAMINOPHEN 10-325 MG PO TABS: 1.0000 | ORAL_TABLET | ORAL | 0 refills | Status: DC | PRN

## 2020-08-27 NOTE — Telephone Encounter (Signed)
Yes I am okay with either cutting in half or every other day.

## 2020-08-27 NOTE — Telephone Encounter (Signed)
RN spoke with patient's wife [name], relayed that per Marcos Eke, NP he is okay with them either cutting the Movantik in half or taking every other day, Melanie verbalized understanding and has no further questions.   Sandie Ano, RN

## 2020-08-27 NOTE — Telephone Encounter (Signed)
Mrs Hovater called to request a refill on Christopher [name]'s oxycodone 10/325.  Per the PMP the last fill date was 07/27/20. HIs next appt is 09/02/20.

## 2020-08-27 NOTE — Telephone Encounter (Signed)
Left HIPAA compliant voicemail requesting callback.   Madeleine Fenn D Dannie Hattabaugh, RN  

## 2020-08-27 NOTE — Telephone Encounter (Signed)
Received call from patient's wife, she is wondering if patient's Movantik dose can be cut in half, or if they can reduce frequency to just take every other day. She states patient is having increased mucus-like drainage from his rectum while being on the medication, which is causing a wound in the area. They are packing the wound with calcium alginate and using zinc oxide. Will route to provider.   Sandie Ano, RN

## 2020-08-27 NOTE — Telephone Encounter (Signed)
Refilled percocet 10/325 mg q3 hours prn- #180- last refilled 07/27/20- will see at next f/u.

## 2020-08-28 NOTE — Telephone Encounter (Signed)
Notified. 

## 2020-08-31 ENCOUNTER — Other Ambulatory Visit: Payer: Self-pay | Admitting: Family Medicine

## 2020-09-02 ENCOUNTER — Encounter
Payer: No Typology Code available for payment source | Attending: Physical Medicine and Rehabilitation | Admitting: Physical Medicine and Rehabilitation

## 2020-09-02 ENCOUNTER — Encounter: Payer: Self-pay | Admitting: Physical Medicine and Rehabilitation

## 2020-09-02 ENCOUNTER — Other Ambulatory Visit: Payer: Self-pay

## 2020-09-02 VITALS — BP 137/85 | HR 104 | Temp 98.6°F | Ht 75.0 in

## 2020-09-02 DIAGNOSIS — G8921 Chronic pain due to trauma: Secondary | ICD-10-CM | POA: Diagnosis present

## 2020-09-02 DIAGNOSIS — K5903 Drug induced constipation: Secondary | ICD-10-CM | POA: Diagnosis present

## 2020-09-02 DIAGNOSIS — G894 Chronic pain syndrome: Secondary | ICD-10-CM | POA: Diagnosis present

## 2020-09-02 DIAGNOSIS — F411 Generalized anxiety disorder: Secondary | ICD-10-CM | POA: Diagnosis present

## 2020-09-02 DIAGNOSIS — Z79891 Long term (current) use of opiate analgesic: Secondary | ICD-10-CM

## 2020-09-02 DIAGNOSIS — Z5181 Encounter for therapeutic drug level monitoring: Secondary | ICD-10-CM

## 2020-09-02 DIAGNOSIS — T402X5A Adverse effect of other opioids, initial encounter: Secondary | ICD-10-CM | POA: Diagnosis present

## 2020-09-02 MED ORDER — CLONAZEPAM 0.5 MG PO TABS: 0.2500 mg | ORAL_TABLET | Freq: Four times a day (QID) | ORAL | 5 refills | Status: DC | PRN
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
## 2020-09-02 NOTE — Patient Instructions (Addendum)
Here for f/u on chronic pain due to trauma- is crying today. Due to severe abd pain- which is for 10 days or so.   Likely having severe constipation- likely worse by reduction in Movantik- I would think has a big piece above that's blocking things- but loose stools getting through.    1. Suggest Magnesium Citrate- has cherry flavor or lemon lime- try Cherry unless hates cherry. Drink faster- it's 8 oz- 90 cents usually at [facility]. - If doesn't work, then go to ER-   2.  A lot of gas- can be made worse by carbonated sodas-  Fyi.  Makes abdominal pain worse.   3. Will increase Clonazepam by 0.25 mg as needed- when  things get bad, can take 0.5 mg rarely, when things get bad- so that's what the increase is for, not DAILY usage. Don't fill older Rx- just new Rx- will do 0.25 mg q6 hours prn- - but to use as above- .   4. Has been filled/pain meds.    5. Pt was clear pain is like the last time was so constipated- if it gets worse, go to ER.   6. Increase Movantik dose to previous dose.   7.  F/U in 8 weeks if possible- double appointment  8. Call psychology and see them for coping skills! D/w pt and wife. Dr Gibson Ramp-

## 2020-09-07 LAB — DRUG TOX MONITOR 1 W/CONF, ORAL FLD
Alprazolam: NEGATIVE ng/mL (ref <0.50)
Amphetamines: NEGATIVE ng/mL (ref <10)
Barbiturates: NEGATIVE ng/mL (ref <10)
Benzodiazepines: POSITIVE ng/mL — AB (ref <0.50)
Buprenorphine: NEGATIVE ng/mL (ref <0.10)
Chlordiazepoxide: NEGATIVE ng/mL (ref <0.50)
Clonazepam: 0.94 ng/mL — ABNORMAL HIGH (ref <0.50)
Cocaine: NEGATIVE ng/mL (ref <5.0)
Codeine: NEGATIVE ng/mL (ref <2.5)
Diazepam: NEGATIVE ng/mL (ref <0.50)
Dihydrocodeine: NEGATIVE ng/mL (ref <2.5)
Fentanyl: 3.67 ng/mL — ABNORMAL HIGH (ref <0.10)
Fentanyl: POSITIVE ng/mL — AB (ref <0.10)
Flunitrazepam: NEGATIVE ng/mL (ref <0.50)
Flurazepam: NEGATIVE ng/mL (ref <0.50)
Heroin Metabolite: NEGATIVE ng/mL (ref <1.0)
Hydrocodone: NEGATIVE ng/mL (ref <2.5)
Hydromorphone: NEGATIVE ng/mL (ref <2.5)
Lorazepam: NEGATIVE ng/mL (ref <0.50)
MARIJUANA: NEGATIVE ng/mL (ref <2.5)
MDMA: NEGATIVE ng/mL (ref <10)
Meprobamate: NEGATIVE ng/mL (ref <2.5)
Methadone: NEGATIVE ng/mL (ref <5.0)
Midazolam: NEGATIVE ng/mL (ref <0.50)
Morphine: NEGATIVE ng/mL (ref <2.5)
Nicotine Metabolite: NEGATIVE ng/mL (ref <5.0)
Nordiazepam: NEGATIVE ng/mL (ref <0.50)
Norhydrocodone: NEGATIVE ng/mL (ref <2.5)
Noroxycodone: 52.6 ng/mL — ABNORMAL HIGH (ref <2.5)
Opiates: POSITIVE ng/mL — AB (ref <2.5)
Oxazepam: NEGATIVE ng/mL (ref <0.50)
Oxycodone: 85.8 ng/mL — ABNORMAL HIGH (ref <2.5)
Oxymorphone: NEGATIVE ng/mL (ref <2.5)
Phencyclidine: NEGATIVE ng/mL (ref <10)
Tapentadol: NEGATIVE ng/mL (ref <5.0)
Temazepam: NEGATIVE ng/mL (ref <0.50)
Tramadol: NEGATIVE ng/mL (ref <5.0)
Triazolam: NEGATIVE ng/mL (ref <0.50)
Zolpidem: NEGATIVE ng/mL (ref <5.0)

## 2020-09-07 LAB — DRUG TOX ALC METAB W/CON, ORAL FLD: Alcohol Metabolite: NEGATIVE ng/mL (ref <25)

## 2020-09-08 ENCOUNTER — Telehealth: Payer: Self-pay | Admitting: *Deleted

## 2020-09-08 NOTE — Telephone Encounter (Signed)
Oral swab drug screen was consistent for prescribed medications.  ?

## 2020-09-11 ENCOUNTER — Telehealth: Payer: Self-pay

## 2020-09-11 MED ORDER — FENTANYL 75 MCG/HR TD PT72: 1.0000 | MEDICATED_PATCH | TRANSDERMAL | 0 refills | Status: DC

## 2020-09-11 NOTE — Telephone Encounter (Signed)
Mailbox was full and could not leave a message. Pharmacy usually contacts about med being ready.

## 2020-09-11 NOTE — Telephone Encounter (Signed)
Caroline More needs A refill on his Fentanyl Patches per PMP last filled 08/11/20.

## 2020-09-18 ENCOUNTER — Ambulatory Visit: Payer: Self-pay | Admitting: Surgical

## 2020-09-18 ENCOUNTER — Telehealth: Payer: Self-pay

## 2020-09-18 NOTE — Telephone Encounter (Signed)
Patient's wife [name]) called to let us know that they need to cancel their appointment for this morning.  She said that their son is running a fever and they don't want to risk spreading something.

## 2020-09-21 ENCOUNTER — Other Ambulatory Visit: Payer: Self-pay

## 2020-09-21 ENCOUNTER — Emergency Department (HOSPITAL_COMMUNITY)
Admission: EM | Admit: 2020-09-21 | Discharge: 2020-09-22 | Disposition: A | Discharge status: Home / Self Care | Payer: No Typology Code available for payment source | Attending: Emergency Medicine | Admitting: Emergency Medicine

## 2020-09-21 ENCOUNTER — Emergency Department (HOSPITAL_COMMUNITY): Payer: No Typology Code available for payment source

## 2020-09-21 DIAGNOSIS — Z20822 Contact with and (suspected) exposure to covid-19: Secondary | ICD-10-CM | POA: Insufficient documentation

## 2020-09-21 DIAGNOSIS — R0602 Shortness of breath: Secondary | ICD-10-CM | POA: Diagnosis not present

## 2020-09-21 DIAGNOSIS — R0789 Other chest pain: Secondary | ICD-10-CM | POA: Insufficient documentation

## 2020-09-21 DIAGNOSIS — R059 Cough, unspecified: Secondary | ICD-10-CM | POA: Diagnosis not present

## 2020-09-21 DIAGNOSIS — R630 Anorexia: Secondary | ICD-10-CM | POA: Diagnosis not present

## 2020-09-21 DIAGNOSIS — Z87891 Personal history of nicotine dependence: Secondary | ICD-10-CM | POA: Insufficient documentation

## 2020-09-21 DIAGNOSIS — J189 Pneumonia, unspecified organism: Secondary | ICD-10-CM

## 2020-09-21 DIAGNOSIS — I509 Heart failure, unspecified: Secondary | ICD-10-CM | POA: Insufficient documentation

## 2020-09-21 DIAGNOSIS — I110 Hypertensive heart disease with heart failure: Secondary | ICD-10-CM | POA: Diagnosis not present

## 2020-09-21 LAB — RESP PANEL BY RT-PCR (FLU A&B, COVID) ARPGX2
Influenza A by PCR: NEGATIVE
Influenza B by PCR: NEGATIVE
SARS Coronavirus 2 by RT PCR: NEGATIVE

## 2020-09-21 LAB — COMPREHENSIVE METABOLIC PANEL
ALT: 85 U/L — ABNORMAL HIGH (ref 0–44)
AST: 38 U/L (ref 15–41)
Albumin: 2.9 g/dL — ABNORMAL LOW (ref 3.5–5.0)
Alkaline Phosphatase: 167 U/L — ABNORMAL HIGH (ref 38–126)
Anion gap: 8 (ref 5–15)
BUN: 10 mg/dL (ref 6–20)
CO2: 24 mmol/L (ref 22–32)
Calcium: 9.3 mg/dL (ref 8.9–10.3)
Chloride: 105 mmol/L (ref 98–111)
Creatinine, Ser: 0.59 mg/dL — ABNORMAL LOW (ref 0.61–1.24)
GFR, Estimated: >60 mL/min (ref >60)
Glucose, Bld: 124 mg/dL — ABNORMAL HIGH (ref 70–99)
Potassium: 3.5 mmol/L (ref 3.5–5.1)
Sodium: 137 mmol/L (ref 135–145)
Total Bilirubin: 1.1 mg/dL (ref 0.3–1.2)
Total Protein: 7 g/dL (ref 6.5–8.1)

## 2020-09-21 LAB — CBC WITH DIFFERENTIAL/PLATELET
Abs Immature Granulocytes: 0.05 10*3/uL (ref 0.00–0.07)
Basophils Absolute: 0.1 10*3/uL (ref 0.0–0.1)
Basophils Relative: 0 %
Eosinophils Absolute: 0.3 10*3/uL (ref 0.0–0.5)
Eosinophils Relative: 3 %
HCT: 38.6 % — ABNORMAL LOW (ref 39.0–52.0)
Hemoglobin: 11.9 g/dL — ABNORMAL LOW (ref 13.0–17.0)
Immature Granulocytes: 0 %
Lymphocytes Relative: 4 %
Lymphs Abs: 0.5 10*3/uL — ABNORMAL LOW (ref 0.7–4.0)
MCH: 30.5 pg (ref 26.0–34.0)
MCHC: 30.8 g/dL (ref 30.0–36.0)
MCV: 99 fL (ref 80.0–100.0)
Monocytes Absolute: 0.5 10*3/uL (ref 0.1–1.0)
Monocytes Relative: 4 %
Neutro Abs: 10.5 10*3/uL — ABNORMAL HIGH (ref 1.7–7.7)
Neutrophils Relative %: 89 %
Platelets: 236 10*3/uL (ref 150–400)
RBC: 3.9 MIL/uL — ABNORMAL LOW (ref 4.22–5.81)
RDW: 12.3 % (ref 11.5–15.5)
WBC: 11.9 10*3/uL — ABNORMAL HIGH (ref 4.0–10.5)
nRBC: 0 % (ref 0.0–0.2)

## 2020-09-21 LAB — TROPONIN I (HIGH SENSITIVITY)
Troponin I (High Sensitivity): 4 ng/L (ref <18)
Troponin I (High Sensitivity): 4 ng/L (ref <18)

## 2020-09-21 LAB — LACTIC ACID, PLASMA: Lactic Acid, Venous: 1.9 mmol/L (ref 0.5–1.9)

## 2020-09-21 MED ORDER — SODIUM CHLORIDE 0.9 % IV BOLUS
500.0000 mL | Freq: Once | INTRAVENOUS | Status: AC
Start: 2020-09-21 — End: 2020-09-21
  Administered 2020-09-21: 500 mL via INTRAVENOUS

## 2020-09-21 MED ORDER — AMOXICILLIN-POT CLAVULANATE 875-125 MG PO TABS: 1.0000 | ORAL_TABLET | Freq: Two times a day (BID) | ORAL | 0 refills | Status: DC

## 2020-09-21 MED ORDER — AMOXICILLIN-POT CLAVULANATE 875-125 MG PO TABS
1.0000 | ORAL_TABLET | Freq: Once | ORAL | Status: AC
  Administered 2020-09-21: 1 via ORAL
  Filled 2020-09-21: qty 1

## 2020-09-21 MED ORDER — FENTANYL CITRATE (PF) 100 MCG/2ML IJ SOLN
100.0000 ug | INTRAMUSCULAR | Status: DC | PRN
  Administered 2020-09-21: 100 ug via INTRAVENOUS
  Filled 2020-09-21: qty 2

## 2020-09-21 NOTE — Discharge Instructions (Signed)
Make sure you are drinking plenty of liquids, take Tylenol if needed for pain.  Use Robitussin-DM every 4 hours as needed for cough.  Start the prescription antibiotic, Augmentin, tomorrow morning.  Call your primary care doctor for follow-up appointment in 3 to 5 days, sooner if needed for problems.
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
## 2020-09-22 ENCOUNTER — Telehealth (HOSPITAL_BASED_OUTPATIENT_CLINIC_OR_DEPARTMENT_OTHER): Payer: Self-pay | Admitting: Emergency Medicine

## 2020-09-22 LAB — BLOOD CULTURE ID PANEL (REFLEXED) - BCID2

## 2020-09-22 NOTE — ED Notes (Signed)
Provided PTAR with med necessity, face sheet, avs paper.

## 2020-09-22 NOTE — ED Provider Notes (Signed)
Blood culture reviewed.  Staph epidermidis in 1 out of 2 bottles.  Suspect contaminant.   Virgina Norfolk, DO 09/22/20 1808

## 2020-09-24 ENCOUNTER — Telehealth: Payer: Self-pay

## 2020-09-24 LAB — CULTURE, BLOOD (ROUTINE X 2)

## 2020-09-24 NOTE — Telephone Encounter (Signed)
The organism that has resulted is typically found in the skin and the fact that it was in only one blood culture means that it is likely a contaminate and no treatment is necessary for this.

## 2020-09-24 NOTE — Telephone Encounter (Signed)
Patient's wife called in regards to blood cultures patient had done in the ED. Patient's wife states she has not heard from a doctor regarding the results, saw the results on my chart and is concerned. Per ED doctor's note the culture was contaminant. Please advise. Lashane Whelpley T Pricilla Loveless

## 2020-09-24 NOTE — Telephone Encounter (Signed)
Patient wife informed of results and verbalized understanding. Patient's wife had no further questions. Mairin Lindsley T Pricilla Loveless

## 2020-09-25 ENCOUNTER — Telehealth: Payer: Self-pay | Admitting: Emergency Medicine

## 2020-09-25 NOTE — Telephone Encounter (Signed)
Post ED Visit - Positive Culture Follow-up  Culture report reviewed by antimicrobial stewardship pharmacist: [facility] []  , Pharm.D. []  Enzo Bi, Pharm.D., BCPS AQ-ID []  , Pharm.D., BCPS []  Celedonio Miyamoto, Pharm.D., BCPS []  [place], Garvin Fila.D., BCPS, AAHIVP []  , Pharm.D., BCPS, AAHIVP []  Georgina Pillion, PharmD, BCPS []  , PharmD, BCPS []  [place], PharmD, BCPS [x]  [place], PharmD []  , PharmD, BCPS []  Estella Husk, PharmD  Pharmacy Team []  Lysle Pearl, PharmD []  , PharmD []  Phillips Climes, PharmD []  , Rph []  Agapito Games) , PharmD []  Sanda Klein, PharmD []  , PharmD []  Mervyn Gay, PharmD []  , PharmD []  Vinnie Level, PharmD []  Wonda Olds, PharmD []  , PharmD []  Len Childs, PharmD   Positive blood  culture No further patient follow-up is required at this time. C Jailyne Chieffo 09/25/2020, 4:00 PM

## 2020-09-26 LAB — CULTURE, BLOOD (ROUTINE X 2): Culture: NO GROWTH

## 2020-09-28 ENCOUNTER — Telehealth: Payer: Self-pay | Admitting: Gastroenterology

## 2020-09-28 DIAGNOSIS — R1310 Dysphagia, unspecified: Secondary | ICD-10-CM

## 2020-09-28 NOTE — Telephone Encounter (Signed)
Called and spoke with [facility] Scheduling, they advised that they did not receive the referral but went in and looked at it. They did let me know that the patient needs to have a Modified Barium Swallow prior to being scheduled.  I have called Arline Asp to update her and let her know what is going. I have advised that if Dr. Myrtie Neither is okay with the Barium Swallow we will get [name] set up and then he can call the office and schedule an appointment.  Is it okay to proceed with the Barium Swallow in order for patient to schedule his appointment for speech.

## 2020-09-28 NOTE — Addendum Note (Signed)
Addended by: Arville Care on: 09/28/2020 04:43 PM   Modules accepted: Orders

## 2020-09-28 NOTE — Telephone Encounter (Signed)
Laretta Bolster, pt's nurse case manager, called to inform that nobody has called from speech therapy office yet to schedule pt for appt. Pls call her.

## 2020-09-29 ENCOUNTER — Emergency Department (HOSPITAL_COMMUNITY): Payer: No Typology Code available for payment source

## 2020-09-29 ENCOUNTER — Emergency Department (HOSPITAL_COMMUNITY)
Admission: EM | Admit: 2020-09-29 | Discharge: 2020-09-30 | Disposition: A | Discharge status: Home / Self Care | Payer: No Typology Code available for payment source | Attending: Emergency Medicine | Admitting: Emergency Medicine

## 2020-09-29 ENCOUNTER — Other Ambulatory Visit: Payer: Self-pay

## 2020-09-29 DIAGNOSIS — Z20822 Contact with and (suspected) exposure to covid-19: Secondary | ICD-10-CM | POA: Diagnosis not present

## 2020-09-29 DIAGNOSIS — J189 Pneumonia, unspecified organism: Secondary | ICD-10-CM | POA: Diagnosis not present

## 2020-09-29 DIAGNOSIS — Z87891 Personal history of nicotine dependence: Secondary | ICD-10-CM | POA: Insufficient documentation

## 2020-09-29 DIAGNOSIS — R0602 Shortness of breath: Secondary | ICD-10-CM | POA: Diagnosis present

## 2020-09-29 DIAGNOSIS — R531 Weakness: Secondary | ICD-10-CM | POA: Diagnosis not present

## 2020-09-29 DIAGNOSIS — I509 Heart failure, unspecified: Secondary | ICD-10-CM | POA: Insufficient documentation

## 2020-09-29 DIAGNOSIS — Z79899 Other long term (current) drug therapy: Secondary | ICD-10-CM | POA: Insufficient documentation

## 2020-09-29 DIAGNOSIS — I110 Hypertensive heart disease with heart failure: Secondary | ICD-10-CM | POA: Insufficient documentation

## 2020-09-29 DIAGNOSIS — R1084 Generalized abdominal pain: Secondary | ICD-10-CM | POA: Diagnosis not present

## 2020-09-29 DIAGNOSIS — A419 Sepsis, unspecified organism: Secondary | ICD-10-CM

## 2020-09-29 LAB — COMPREHENSIVE METABOLIC PANEL
ALT: 21 U/L (ref 0–44)
AST: 20 U/L (ref 15–41)
Albumin: 2.9 g/dL — ABNORMAL LOW (ref 3.5–5.0)
Alkaline Phosphatase: 131 U/L — ABNORMAL HIGH (ref 38–126)
Anion gap: 6 (ref 5–15)
BUN: 7 mg/dL (ref 6–20)
CO2: 29 mmol/L (ref 22–32)
Calcium: 9.2 mg/dL (ref 8.9–10.3)
Chloride: 104 mmol/L (ref 98–111)
Creatinine, Ser: 0.55 mg/dL — ABNORMAL LOW (ref 0.61–1.24)
GFR, Estimated: >60 mL/min (ref >60)
Glucose, Bld: 105 mg/dL — ABNORMAL HIGH (ref 70–99)
Potassium: 3.6 mmol/L (ref 3.5–5.1)
Sodium: 139 mmol/L (ref 135–145)
Total Bilirubin: 0.3 mg/dL (ref 0.3–1.2)
Total Protein: 7.2 g/dL (ref 6.5–8.1)

## 2020-09-29 LAB — CBC WITH DIFFERENTIAL/PLATELET
Abs Immature Granulocytes: 0.01 10*3/uL (ref 0.00–0.07)
Basophils Absolute: 0 10*3/uL (ref 0.0–0.1)
Basophils Relative: 1 %
Eosinophils Absolute: 0.5 10*3/uL (ref 0.0–0.5)
Eosinophils Relative: 11 %
HCT: 36.8 % — ABNORMAL LOW (ref 39.0–52.0)
Hemoglobin: 11.4 g/dL — ABNORMAL LOW (ref 13.0–17.0)
Immature Granulocytes: 0 %
Lymphocytes Relative: 27 %
Lymphs Abs: 1.2 10*3/uL (ref 0.7–4.0)
MCH: 30.2 pg (ref 26.0–34.0)
MCHC: 31 g/dL (ref 30.0–36.0)
MCV: 97.4 fL (ref 80.0–100.0)
Monocytes Absolute: 0.3 10*3/uL (ref 0.1–1.0)
Monocytes Relative: 7 %
Neutro Abs: 2.5 10*3/uL (ref 1.7–7.7)
Neutrophils Relative %: 54 %
Platelets: 324 10*3/uL (ref 150–400)
RBC: 3.78 MIL/uL — ABNORMAL LOW (ref 4.22–5.81)
RDW: 11.6 % (ref 11.5–15.5)
WBC: 4.6 10*3/uL (ref 4.0–10.5)
nRBC: 0 % (ref 0.0–0.2)

## 2020-09-29 LAB — URINALYSIS, ROUTINE W REFLEX MICROSCOPIC
Glucose, UA: NEGATIVE mg/dL
Ketones, ur: NEGATIVE mg/dL
Nitrite: NEGATIVE
Protein, ur: 30 mg/dL — AB
Specific Gravity, Urine: 1.025 (ref 1.005–1.030)
pH: 5 (ref 5.0–8.0)

## 2020-09-29 LAB — RESP PANEL BY RT-PCR (FLU A&B, COVID) ARPGX2
Influenza A by PCR: NEGATIVE
Influenza B by PCR: NEGATIVE
SARS Coronavirus 2 by RT PCR: NEGATIVE

## 2020-09-29 LAB — PROTIME-INR
INR: 1.1 (ref 0.8–1.2)
Prothrombin Time: 14.3 seconds (ref 11.4–15.2)

## 2020-09-29 LAB — APTT: aPTT: 32 seconds (ref 24–36)

## 2020-09-29 LAB — LACTIC ACID, PLASMA: Lactic Acid, Venous: 0.9 mmol/L (ref 0.5–1.9)

## 2020-09-29 MED ORDER — SODIUM CHLORIDE 0.9 % IV SOLN
500.0000 mg | Freq: Once | INTRAVENOUS | Status: AC
  Administered 2020-09-29: 500 mg via INTRAVENOUS
  Filled 2020-09-29: qty 500

## 2020-09-29 MED ORDER — SODIUM CHLORIDE 0.9 % IV BOLUS
30.0000 mL/kg | Freq: Once | INTRAVENOUS | Status: AC
  Administered 2020-09-29: 2343 mL via INTRAVENOUS

## 2020-09-29 MED ORDER — IOHEXOL 350 MG/ML SOLN
100.0000 mL | Freq: Once | INTRAVENOUS | Status: AC | PRN
  Administered 2020-09-29: 100 mL via INTRAVENOUS

## 2020-09-29 MED ORDER — CLONAZEPAM 0.5 MG PO TABS
0.5000 mg | ORAL_TABLET | Freq: Once | ORAL | Status: AC
  Administered 2020-09-29: 0.5 mg via ORAL
  Filled 2020-09-29: qty 1

## 2020-09-29 MED ORDER — VANCOMYCIN HCL 1500 MG/300ML IV SOLN
1500.0000 mg | Freq: Two times a day (BID) | INTRAVENOUS | Status: DC
  Filled 2020-09-29: qty 300

## 2020-09-29 MED ORDER — MORPHINE SULFATE (PF) 4 MG/ML IV SOLN
4.0000 mg | Freq: Once | INTRAVENOUS | Status: AC
Start: 2020-09-29 — End: 2020-09-29
  Administered 2020-09-29: 4 mg via INTRAVENOUS
  Filled 2020-09-29: qty 1

## 2020-09-29 MED ORDER — SODIUM CHLORIDE 0.9 % IV SOLN
2.0000 g | Freq: Three times a day (TID) | INTRAVENOUS | Status: DC
  Administered 2020-09-29 – 2020-09-30 (×2): 2 g via INTRAVENOUS
  Filled 2020-09-29 (×2): qty 2

## 2020-09-29 MED ORDER — HYDROMORPHONE HCL 1 MG/ML IJ SOLN
1.0000 mg | Freq: Once | INTRAMUSCULAR | Status: AC
Start: 2020-09-29 — End: 2020-09-29
  Administered 2020-09-29: 1 mg via INTRAVENOUS
  Filled 2020-09-29: qty 1

## 2020-09-29 MED ORDER — SODIUM CHLORIDE 0.9 % IV SOLN: 2.0000 g | Freq: Once | INTRAVENOUS | Status: DC

## 2020-09-29 MED ORDER — ONDANSETRON HCL 4 MG/2ML IJ SOLN
4.0000 mg | Freq: Once | INTRAMUSCULAR | Status: AC
  Administered 2020-09-29: 4 mg via INTRAVENOUS
  Filled 2020-09-29: qty 2

## 2020-09-29 MED ORDER — AMOXICILLIN-POT CLAVULANATE 875-125 MG PO TABS: 1.0000 | ORAL_TABLET | Freq: Two times a day (BID) | ORAL | 0 refills | Status: AC

## 2020-09-29 MED ORDER — VANCOMYCIN HCL 1000 MG/200ML IV SOLN: 1000.0000 mg | Freq: Once | INTRAVENOUS | Status: DC

## 2020-09-29 MED ORDER — VANCOMYCIN HCL 1750 MG/350ML IV SOLN
1750.0000 mg | Freq: Once | INTRAVENOUS | Status: AC
  Administered 2020-09-29: 1750 mg via INTRAVENOUS
  Filled 2020-09-29: qty 350
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
## 2020-09-29 NOTE — Progress Notes (Signed)
Pharmacy Antibiotic Note  Christopher [name] is a 55 y.o. male admitted on 09/29/2020 with pneumonia.  Pharmacy has been consulted for vancomycin and cefepime dosing.  Plan: Azithromycin 500 mg IV x1 per MD  Cefepime 2g IV q8h Vancomycin 1750 mg IV x1, then 1500 mg IV q12h  - Monitor renal function and clinical improvement - F/u culture data, LOT, and de-escalation   Temp (24hrs), Avg:97.6 F (36.4 C), Min:97.6 F (36.4 C), Max:97.6 F (36.4 C)  No results for input(s): WBC, CREATININE, LATICACIDVEN, VANCOTROUGH, VANCOPEAK, VANCORANDOM, GENTTROUGH, GENTPEAK, GENTRANDOM, TOBRATROUGH, TOBRAPEAK, TOBRARND, AMIKACINPEAK, AMIKACINTROU, AMIKACIN in the last 168 hours.  Estimated Creatinine Clearance: 116.6 mL/min (A) (by C-G formula based on SCr of 0.59 mg/dL (L)).    Allergies  Allergen Reactions  . Methadone Other (See Comments)    Hallucinations/confusion    Antimicrobials this admission: Azithromycin 4/19 x1 Cefepime 4/19 >>  Vancomycin 4/19 >>   Dose adjustments this admission:  Microbiology results: 4/19 Bcx: sent 4/19 Ucx: sent 4/19 Resp panel: sent   Thank you for allowing pharmacy to be a part of this patient's care.  Trixie Rude, PharmD PGY1 Acute Care Pharmacy Resident 09/29/2020 4:59 PM  Please check [email or web address] for unit-specific pharmacy phone numbers.

## 2020-09-29 NOTE — ED Provider Notes (Signed)
Christopher [name] Provider Note   CSN: [id number] Arrival date & time: 09/29/20  1328     History Chief Complaint  Patient presents with  . Weakness    Christopher [name] is a 55 y.o. male.  Christopher [name] is a 55 y.o. male with a history of hypertension, chronic hypoxic respiratory failure on 3 L nasal cannula, chronic pain syndrome, DVT, bedridden s/p traumatic injury, colostomy and suprapubic catheter, who presents to the ED via EMS for evaluation of weakness, vomiting, poor appetite and abdominal pain.  Patient reports last week on 4/11 he was seen in the ED and diagnosed with pneumonia, he was treated with 5 days of Augmentin which he completed but he reports despite this his symptoms continue to worsen, he reports feeling some shortness of breath, continues to have cough intermittently productive of yellow sputum.  Has had chills and fatigue but no documented fever.  He is still on his chronic home oxygen.  He reports that for the past 3 days he has been having some pain, primarily in the epigastric region and has been vomiting and having very little appetite.  He reports normal output from his colostomy and suprapubic catheter.  His wife lives at home with him and is his primary caretaker.  He is on chronic daily pain medications.  Patient is a poor historian, unable to provide any other information.        Past Medical History:  Diagnosis Date  . Acute on chronic respiratory failure with hypoxia ([place]) 06/2018   trach removed 11-16-2018, on vent from jan until may 2020 - uses albuterol prn  . Anxiety   . Bacteremia due to Pseudomonas 06/2018  . Chronic osteomyelitis ([place])   . Chronic pain syndrome   . Clostridium difficile colitis 10/30/2019   tx with abx   . Depression   . DVT (deep venous thrombosis) ([place]) 2020   right brachial post PICC line  . History of blood transfusion 06/2018  . History of Clostridioides difficile colitis   .  History of kidney stones   . Hypertension    norvasc d/c by pcp on 11/05/19  . Multiple traumatic injuries   . Penile pain 11/18/2019  . Pneumonia 11/2009   2020 x 2  . Gilford Rile as ambulation aid   . Wheelchair bound    electric  . Wound discharge    left hip wound with bloody/clear drainage change dressing q day surgilube with gauze, between legs wound using calcium algenate pad bid    Patient Active Problem List   Diagnosis Date Noted  . 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 Respiratory failure with hypoxia ([place]) 07/02/2020  . Dysphagia 07/02/2020  . Therapeutic opioid induced constipation 04/11/2020  . Status post peripherally inserted central catheter (PICC) central line placement 03/17/2020  . Skin-picking disorder 03/12/2020  . CHF (congestive heart failure) ([place]) 01/27/2020  . Bone infection of pelvis ([place]) 12/12/2019  . Recurrent pain of right knee 11/08/2019  . Elevated blood pressure reading 11/05/2019  . Acute osteomyelitis of pelvic region and thigh ([place])   . Acute deep vein thrombosis (DVT) of brachial vein of right upper extremity ([place]) 11/01/2019  . Myofascial pain dysfunction syndrome 08/05/2019  . Protein-calorie malnutrition, severe 06/01/2019  . Complicated urinary tract infection 05/30/2019  . Chronic osteomyelitis of pelvis, left ([place]) 03/07/2019  . Wound infection, posttraumatic 02/28/2019  . Hydronephrosis concurrent with and due to calculi of kidney and ureter 02/28/2019  . GERD (gastroesophageal reflux  disease) 02/28/2019  . Suprapubic catheter ([place]) 02/28/2019  . Protein-calorie malnutrition, moderate ([place]) 02/28/2019  . 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 Reactive depression 01/29/2019  . Chronic pain due to trauma 01/29/2019  . Neuropathic pain 01/29/2019  . Colostomy status ([place]) 01/14/2019  . Insomnia 01/14/2019  . Tachycardia 01/14/2019  . Current use of proton pump inhibitor 01/14/2019  . Wound of buttock 12/28/2018   . Anxiety and depression 12/17/2018  . Debility 11/23/2018  . Chronic pain syndrome   . Multiple traumatic injuries   . Pressure injury of skin 08/13/2018  . Urethral injury 07/13/2018    Past Surgical History:  Procedure Laterality Date  . APPLICATION OF A-CELL OF BACK N/A 08/06/2018   Procedure: Application Of A-Cell Of Back;  Surgeon: [name], DO;  Location: [place];  Service: Plastics;  Laterality: N/A;  . APPLICATION OF A-CELL OF EXTREMITY Left 08/06/2018   Procedure: Application Of A-Cell Of Extremity;  Surgeon: [name], DO;  Location: [place];  Service: Plastics;  Laterality: Left;  . APPLICATION OF A-CELL OF EXTREMITY Left 09/18/2019   Procedure: APPLICATION OF A-CELL OF EXTREMITY;  Surgeon: [name], DO;  Location: [place];  Service: Plastics;  Laterality: Left;  . APPLICATION OF WOUND VAC  07/12/2018   Procedure: Application Of Wound Vac to the Left Thigh and Scrotum.;  Surgeon: Shona Needles, MD;  Location: [place];  Service: Orthopedics;;  . APPLICATION OF WOUND VAC  07/10/2018   Procedure: Application Of Wound Vac;  Surgeon: Clovis Riley, MD;  Location: [place];  Service: General;;  . COLON SURGERY  2020   colostomy  . COLOSTOMY N/A 07/23/2018   Procedure: COLOSTOMY;  Surgeon: Georganna Skeans, MD;  Location: [place];  Service: General;  Laterality: N/A;  . CYSTOSCOPY W/ URETERAL STENT PLACEMENT N/A 07/15/2018   Procedure: RETROGRADE URETHROGRAM;  Surgeon: Franchot Gallo, MD;  Location: [facility];  Service: Urology;  Laterality: N/A;  . CYSTOSCOPY WITH LITHOLAPAXY N/A 05/06/2019   Procedure: CYSTOSCOPY BASKET BLADDER STONE EXTRACTION;  Surgeon: Cleon Gustin, MD;  Location: [facility];  Service: Urology;  Laterality: N/A;  30 MINS  . CYSTOSTOMY N/A 05/06/2019   Procedure: REPLACEMENT OF SUPRAPUBIC CATHETER;  Surgeon: Cleon Gustin, MD;  Location: [facility];  Service: Urology;  Laterality: N/A;  .  DEBRIDEMENT AND CLOSURE WOUND Left 03/04/2019   Procedure: Excision of hip wound with placement of [place];  Surgeon: [name], DO;  Location: [place];  Service: Plastics;  Laterality: Left;  . ESOPHAGOGASTRODUODENOSCOPY N/A 08/14/2018   Procedure: ESOPHAGOGASTRODUODENOSCOPY (EGD);  Surgeon: Georganna Skeans, MD;  Location: [place];  Service: General;  Laterality: N/A;  bedside  . FACIAL RECONSTRUCTION SURGERY     X 2--once as a teenager and second time in his 67's  . HARDWARE REMOVAL Left 03/04/2019   Procedure: Left Hip Hardware Removal;  Surgeon: Shona Needles, MD;  Location: [place];  Service: Orthopedics;  Laterality: Left;  . HIP PINNING,CANNULATED Left 07/12/2018   Procedure: CANNULATED HIP PINNING;  Surgeon: Shona Needles, MD;  Location: [name];  Service: Orthopedics;  Laterality: Left;  . HIP SURGERY    . HOLMIUM LASER APPLICATION Right 09/12/7060   Procedure: HOLMIUM LASER APPLICATION;  Surgeon: Cleon Gustin, MD;  Location[facility];  Service: Urology;  Laterality: Right;  . I & D EXTREMITY Left 07/25/2018   Procedure: Debridement of buttock, scrotum and left  leg, placement of acell and vac;  Surgeon: [name], DO;  Location: [place];  Service: Plastics;  Laterality: Left;  . I & D EXTREMITY N/A 08/06/2018   Procedure: Debridement of buttock, scrotum and left leg;  Surgeon: [name], DO;  Location: [place];  Service: Plastics;  Laterality: N/A;  . I & D EXTREMITY N/A 08/13/2018   Procedure: Debridement of buttock, scrotum and left leg, placement of acell and vac;  Surgeon: [name], DO;  Location: [place];  Service: Plastics;  Laterality: N/A;  90 min, please  . INCISION AND DRAINAGE HIP Left 09/18/2019   Procedure: IRRIGATION AND DEBRIDEMENT HIP/ PELVIS WITH WOUND VAC PLACEMENT;  Surgeon: Shona Needles, MD;  Location: [place];  Service: Orthopedics;  Laterality: Left;  . INCISION AND DRAINAGE OF WOUND N/A 07/18/2018   Procedure:  Debridement of left leg, buttocks and scrotal wound with placement of acell and Flexiseal;  Surgeon: [name], DO;  Location: [place];  Service: Plastics;  Laterality: N/A;  . INCISION AND DRAINAGE OF WOUND Left 08/29/2018   Procedure: Debridement of buttock, scrotum and left leg, placement of acell and vac;  Surgeon: [name], DO;  Location: [place];  Service: Plastics;  Laterality: Left;  75 min, please  . INCISION AND DRAINAGE OF WOUND Bilateral 10/23/2018   Procedure: DEBRIDEMENT OF BUTTOCK,SCROTUM, AND LEG WOUNDS WITH PLACEMENT OF ACELL- BILATERAL 90 MIN;  Surgeon: [name], DO;  Location: [place];  Service: Plastics;  Laterality: Bilateral;  . IR ANGIOGRAM PELVIS SELECTIVE OR SUPRASELECTIVE  07/10/2018  . IR ANGIOGRAM PELVIS SELECTIVE OR SUPRASELECTIVE  07/10/2018  . IR ANGIOGRAM SELECTIVE EACH ADDITIONAL VESSEL  07/10/2018  . IR EMBO ART  VEN HEMORR LYMPH EXTRAV  INC GUIDE ROADMAPPING  07/10/2018  . IR NEPHROSTOMY PLACEMENT LEFT  04/05/2019  . IR NEPHROSTOMY PLACEMENT RIGHT  05/31/2019  . IR US GUIDE BX ASP/DRAIN  07/10/2018  . IR US GUIDE VASC ACCESS RIGHT  07/10/2018  . IR VENO/EXT/UNI LEFT  07/10/2018  . IRRIGATION AND DEBRIDEMENT OF WOUND WITH SPLIT THICKNESS SKIN GRAFT Left 09/19/2018   Procedure: Debridement of gluteal wound with placement of acell to left leg.;  Surgeon: [name], DO;  Location: [place];  Service: Plastics;  Laterality: Left;  2.5 hours, please  . LAPAROTOMY N/A 07/12/2018   Procedure: EXPLORATORY LAPAROTOMY;  Surgeon: Georganna Skeans, MD;  Location: [place];  Service: General;  Laterality: N/A;  . LAPAROTOMY N/A 07/15/2018   Procedure: WOUND EXPLORATION; CLOSURE OF ABDOMEN;  Surgeon: Georganna Skeans, MD;  Location: [place];  Service: General;  Laterality: N/A;  . LAPAROTOMY  07/10/2018   Procedure: Exploratory Laparotomy;  Surgeon: Clovis Riley, MD;  Location: [place];  Service: General;;  . MASS EXCISION Left 09/18/2019   Procedure:  EXCISION UPPER LEFT INNER THIGH WOUND;  Surgeon: [name], DO;  Location: [place];  Service: Plastics;  Laterality: Left;  . NEPHROLITHOTOMY Right 07/15/2019   Procedure: NEPHROLITHOTOMY PERCUTANEOUS;  Surgeon: Cleon Gustin, MD;  Location: [facility];  Service: Urology;  Laterality: Right;  90 MINS  . PEG PLACEMENT N/A 08/14/2018   Procedure: PERCUTANEOUS ENDOSCOPIC GASTROSTOMY (PEG) PLACEMENT;  Surgeon: Georganna Skeans, MD;  Location: [place];  Service: General;  Laterality: N/A;  . PERCUTANEOUS TRACHEOSTOMY N/A 08/02/2018   Procedure: PERCUTANEOUS TRACHEOSTOMY;  Surgeon: Georganna Skeans, MD;  Location: [place];  Service: General;  Laterality: N/A;  . RADIOLOGY WITH ANESTHESIA N/A 07/10/2018  Procedure: IR WITH ANESTHESIA;  Surgeon: Sandi Mariscal, MD;  Location: [place];  Service: Radiology;  Laterality: N/A;  . RADIOLOGY WITH ANESTHESIA Right 07/10/2018   Procedure: Ir With Anesthesia;  Surgeon: Sandi Mariscal, MD;  Location: [place];  Service: Radiology;  Laterality: Right;  . SCROTAL EXPLORATION N/A 07/15/2018   Procedure: SCROTUM DEBRIDEMENT;  Surgeon: Franchot Gallo, MD;  Location: [place];  Service: Urology;  Laterality: N/A;  . SHOULDER SURGERY    . SKIN SPLIT GRAFT Right 09/19/2018   Procedure: Skin Graft Split Thickness;  Surgeon: [name], DO;  Location: [place];  Service: Plastics;  Laterality: Right;  . SKIN SPLIT GRAFT N/A 10/03/2018   Procedure: Split thickness skin graft to gluteal area with acell placement;  Surgeon: [name], DO;  Location: [place];  Service: Plastics;  Laterality: N/A;  3 hours, please  . VACUUM ASSISTED CLOSURE CHANGE N/A 07/12/2018   Procedure: ABDOMINAL VACUUM ASSISTED CLOSURE CHANGE and abdominal washout;  Surgeon: Georganna Skeans, MD;  Location: [place];  Service: General;  Laterality: N/A;  . WOUND DEBRIDEMENT Left 07/23/2018   Procedure: DEBRIDEMENT LEFT BUTTOCK  WOUND;  Surgeon: Georganna Skeans, MD;  Location: [place];   Service: General;  Laterality: Left;  . WOUND EXPLORATION Left 07/10/2018   Procedure: WOUND EXPLORATION LEFT GROIN;  Surgeon: Clovis Riley, MD;  Location[facility];  Service: General;  Laterality: Left;       Family History  Problem Relation Age of Onset  . Breast cancer Mother        with mets to the bones    Social History   Tobacco Use  . Smoking status: Former Smoker    Packs/day: 1.00    Years: 20.00    Pack years: 20.00    Types: Cigarettes    Quit date: 07/10/2018    Years since quitting: 2.2  . Smokeless tobacco: Never Used  Vaping Use  . Vaping Use: Never used  Substance Use Topics  . Alcohol use: Never  . Drug use: Yes    Types: Oxycodone, Fentanyl    Comment: Fentanyl patch/oxycodone since 06/2018    Home Medications Prior to Admission medications   Medication Sig Start Date End Date Taking? Authorizing Provider  albuterol (VENTOLIN HFA) 108 (90 Base) MCG/ACT inhaler INHALE 2 PUFFS BY MOUTH EVERY 6 HOURS AS NEEDED FOR WHEEZE OR SHORTNESS OF BREATH (WAITING ON PA) 08/31/20   Vivi Barrack, MD  Amino Acids-Protein Hydrolys (FEEDING SUPPLEMENT, PRO-STAT 64,) LIQD Take 30 mLs by mouth 3 (three) times daily with meals. 07/02/20   Vivi Barrack, MD  amoxicillin-clavulanate (AUGMENTIN) 875-125 MG tablet Take 1 tablet by mouth 2 (two) times daily. One po bid x 7 days 09/21/20   Daleen Bo, MD  Blood Pressure Monitoring (BLOOD PRESSURE CUFF) MISC Use daily as needed to check blood pressure. 11/05/19   Vivi Barrack, MD  ciprofloxacin (CIPRO) 500 MG tablet Take 1 tablet (500 mg total) by mouth 2 (two) times daily. 04/20/20   Golden Circle, FNP  clonazePAM (KLONOPIN) 0.5 MG tablet Take 0.5 tablets (0.25 mg total) by mouth every 6 (six) hours as needed for anxiety. 09/02/20   Lovorn, Jinny Blossom, MD  dextromethorphan-guaiFENesin (MUCINEX DM) 30-600 MG 12hr tablet Take 2 tablets by mouth 2 (two) times daily.    [provider]  dronabinol (MARINOL) 2.5 MG capsule  TAKE 1 CAPSULE (2.5 MG TOTAL) BY MOUTH 2 (TWO) TIMES DAILY BEFORE A MEAL. Patient taking differently: Take 2.5  mg by mouth 2 (two) times daily as needed (to encourage appetite). 07/29/19   Lovorn, Jinny Blossom, MD  famotidine (PEPCID) 20 MG tablet Take 20 mg by mouth daily.    [provider]  fentaNYL (DURAGESIC) 75 MCG/HR Place 1 patch onto the skin every other day. 09/11/20   Lovorn, Jinny Blossom, MD  gabapentin (NEURONTIN) 300 MG capsule Take 1 capsule (300 mg total) by mouth 3 (three) times daily. 05/25/20   Lovorn, Jinny Blossom, MD  lidocaine (LIDODERM) 5 % PLACE 3 PATCHES ONTO THE SKIN EVERY 12 HOURS. REMOVE & DISCARD PATCH WITHIN 12 HOURS OR AS DIRECTED BY MD 06/16/20   Courtney Heys, MD  melatonin ([facility] MELATONIN) 3 MG TABS tablet Take 1 tablet (3 mg total) by mouth at bedtime. 11/01/19   Matcha, Beverely Pace, MD  methocarbamol (ROBAXIN) 500 MG tablet TAKE 1 TABLET BY MOUTH EVERY 6 HOURS AS NEEDED FOR MUSCLE SPASMS Patient taking differently: Take by mouth every 6 (six) hours. 11/06/19   Lovorn, Jinny Blossom, MD  mirabegron ER (MYRBETRIQ) 50 MG TB24 tablet Take 50 mg by mouth daily.    [provider]  Multiple Vitamin (MULTIVITAMIN WITH MINERALS) TABS tablet Take 1 tablet by mouth daily. 03/13/19   Kinnie Feil, MD  naloxegol oxalate (MOVANTIK) 12.5 MG TABS tablet Take 1 tablet (12.5 mg total) by mouth daily. 07/14/20   Golden Circle, FNP  naloxone [facility]) nasal spray 4 mg/0.1 mL To use if pt develops unconsciousness or confusion that family thinks is related to opioids. Patient taking differently: Place 1 spray into the nose daily as needed ("To [place] if pt develops unconsciousness or confusion that family thinks is related to opiods"). 12/30/19   Lovorn, Jinny Blossom, MD  ondansetron (ZOFRAN) 8 MG tablet Take 1 tablet (8 mg total) by mouth every 8 (eight) hours as needed for nausea, vomiting or refractory nausea / vomiting (max dose in a day is 24 mg total). 03/26/20 03/26/21  Vivi Barrack, MD  oxybutynin  (DITROPAN) 5 MG tablet Take 5 mg by mouth See admin instructions. Take 5 mg by mouth three times a day and an additional 5 mg once daily as needed for urinary urgency 11/13/19   [provider]  oxyCODONE-acetaminophen (PERCOCET) 10-325 MG tablet Take 1 tablet by mouth every 3 (three) hours as needed (breakthrough pain). 08/27/20   Lovorn, Jinny Blossom, MD  PARoxetine (PAXIL) 40 MG tablet TAKE 1 TABLET BY MOUTH EVERYDAY AT BEDTIME - PA IN PROGRESS Patient taking differently: Take 40 mg by mouth at bedtime. 03/30/20   Lovorn, Jinny Blossom, MD  Probiotic Product (PROBIOTIC COLON SUPPORT) CAPS Take 1 capsule by mouth daily.    [provider]  promethazine (PHENERGAN) 12.5 MG tablet Take 1 tablet (12.5 mg total) by mouth every 6 (six) hours as needed for nausea, vomiting or refractory nausea / vomiting. 04/28/20   Lovorn, Jinny Blossom, MD  Respiratory Therapy Supplies (SPIROMETER) KIT Use 3-4 times per hour. 11/05/19   Vivi Barrack, MD  senna-docusate (SENOKOT-S) 8.6-50 MG tablet Take 2 tablets by mouth See admin instructions. Takes 2 in the morning and 2 at night. 04/13/20   [provider]  traZODone (DESYREL) 100 MG tablet TAKE 1-2 TABLETS (100-200 MG TOTAL) BY MOUTH AT BEDTIME. 07/06/20   Lovorn, Jinny Blossom, MD  vancomycin (VANCOCIN) 125 MG capsule Take 1 capsule (125 mg total) by mouth 4 (four) times daily. 04/20/20   Golden Circle, FNP    Allergies    Methadone  Review of Systems   Review of  Systems  Constitutional: Positive for appetite change and fatigue. Negative for chills and fever.  HENT: Negative.   Respiratory: Positive for cough and shortness of breath.   Cardiovascular: Negative for chest pain.  Gastrointestinal: Positive for abdominal pain, nausea and vomiting. Negative for constipation and diarrhea.  Musculoskeletal: Positive for back pain (Chronic). Negative for myalgias.  Neurological: Positive for weakness (Generalized). Negative for numbness and headaches.    Physical  Exam Updated Vital Signs BP 94/71 (BP Location: Left Arm)   Pulse 83   Temp 97.6 F (36.4 C) (Oral)   SpO2 97%   Physical Exam Vitals and nursing note reviewed.  Constitutional:      General: He is not in acute distress.    Appearance: He is well-developed. He is ill-appearing. He is not diaphoretic.     Comments: Patient is alert but ill-appearing  HENT:     Head: Normocephalic and atraumatic.     Mouth/Throat:     Mouth: Mucous membranes are dry.     Comments: Mucous membranes dry Eyes:     General:        Right eye: No discharge.        Left eye: No discharge.  Cardiovascular:     Rate and Rhythm: Normal rate and regular rhythm.     Heart sounds: Normal heart sounds. No murmur heard. No friction rub. No gallop.   Pulmonary:     Effort: Pulmonary effort is normal. No respiratory distress.     Breath sounds: Rhonchi and rales present. No wheezing.     Comments: On chronic 3 L nasal cannula, respirations are equal and unlabored, intermittent mild tachypnea, on auscultation patient with some crackles and rhonchi noted in the bases Abdominal:     General: Bowel sounds are normal. There is distension.     Palpations: Abdomen is soft. There is no mass.     Tenderness: There is abdominal tenderness. There is no guarding.     Comments: Abdomen is mildly distended, with some generalized tenderness noted throughout, suprapubic catheter and colostomy noted, no peritoneal signs.  Musculoskeletal:        General: No deformity.     Cervical back: Neck supple.     Right lower leg: No edema.     Left lower leg: No edema.  Skin:    General: Skin is warm and dry.     Capillary Refill: Capillary refill takes less than 2 seconds.  Neurological:     Mental Status: He is alert.     Coordination: Coordination normal.     Comments: Speech is clear, able to follow commands Moves extremities without ataxia, coordination intact  Psychiatric:        Mood and Affect: Mood normal.         Behavior: Behavior normal.     ED Results / Procedures / Treatments   Labs (all labs ordered are listed, but only abnormal results are displayed) Labs Reviewed  URINE CULTURE - Abnormal; Notable for the following components:      Result Value   Culture >=100,000 COLONIES/mL YEAST (*)    All other components within normal limits  COMPREHENSIVE METABOLIC PANEL - Abnormal; Notable for the following components:   Glucose, Bld 105 (*)    Creatinine, Ser 0.55 (*)    Albumin 2.9 (*)    Alkaline Phosphatase 131 (*)    All other components within normal limits  CBC WITH DIFFERENTIAL/PLATELET - Abnormal; Notable for the following components:   RBC 3.78 (*)  Hemoglobin 11.4 (*)    HCT 36.8 (*)    All other components within normal limits  URINALYSIS, ROUTINE W REFLEX MICROSCOPIC - Abnormal; Notable for the following components:   Color, Urine AMBER (*)    APPearance CLOUDY (*)    Hgb urine dipstick SMALL (*)    Bilirubin Urine MODERATE (*)    Protein, ur 30 (*)    Leukocytes,Ua MODERATE (*)    Bacteria, UA FEW (*)    All other components within normal limits  CULTURE, BLOOD (SINGLE)  RESP PANEL BY RT-PCR (FLU A&B, COVID) ARPGX2  LACTIC ACID, PLASMA  PROTIME-INR  APTT    EKG EKG Interpretation  Date/Time:  Tuesday September 29 2020 15:57:53 EDT Ventricular Rate:  77 PR Interval:  160 QRS Duration: 109 QT Interval:  427 QTC Calculation: 484 R Axis:   59 Text Interpretation: Sinus rhythm Probable left atrial enlargement Borderline prolonged QT interval Confirmed by Daleen Bo [phone]) on 09/29/2020 3:59:52 PM Also confirmed by Daleen Bo [phone]), editor Hattie Perch [phone])  on 09/30/2020 11:44:19 AM   Radiology DG Chest 1 View  Result Date: 09/29/2020 CLINICAL DATA:  Abdominal pain and weakness EXAM: CHEST  1 VIEW COMPARISON:  09/21/2020 FINDINGS: Heart size within normal limits. Low lung volumes. Persistent streaky bibasilar opacities, right greater than left. No  pleural effusion. No pneumothorax. IMPRESSION: Low lung volumes with persistent streaky bibasilar opacities, right greater than left. Findings are similar to recent prior exam and may reflect atelectasis versus pneumonia. Electronically Signed   By: Davina Poke D.O.   On: 09/29/2020 16:12     Procedures Procedures   Medications Ordered in ED Medications  sodium chloride 0.9 % bolus 2,343 mL (0 mL/kg  78.1 kg Intravenous Stopped 09/29/20 2108)  azithromycin (ZITHROMAX) 500 mg in sodium chloride 0.9 % 250 mL IVPB (0 mg Intravenous Stopped 09/29/20 1841)  vancomycin (VANCOREADY) IVPB 1750 mg/350 mL (0 mg Intravenous Stopped 09/29/20 2146)  morphine 4 MG/ML injection 4 mg (4 mg Intravenous Given 09/29/20 1936)  ondansetron (ZOFRAN) injection 4 mg (4 mg Intravenous Given 09/29/20 1935)  iohexol (OMNIPAQUE) 350 MG/ML injection 100 mL (100 mLs Intravenous Contrast Given 09/29/20 2008)  HYDROmorphone (DILAUDID) injection 1 mg (1 mg Intravenous Given 09/29/20 2042)  clonazePAM (KLONOPIN) tablet 0.5 mg (0.5 mg Oral Given 09/29/20 2254)    ED Course  I have reviewed the triage vital signs and the nursing notes.  Pertinent labs & imaging results that were available during my care of the patient were reviewed by me and considered in my medical decision making (see chart for details).    MDM Rules/Calculators/A&P                         55 year old male, chronically ill, bedridden due to accident, presents with generalized weakness, fatigue and persistent cough and shortness of breath after being diagnosed and treated for pneumonia last week.  Feels like he has not gotten any better and is now reporting some abdominal pain, nausea and vomiting, difficulty keeping anything down.  On arrival patient is mildly hypotensive with systolic blood pressure ranging in the 80-90s, no tachycardia or fever, patient does appear dry, but given concern for unresolved pneumonia or acute process in the abdomen cannot rule  out sepsis.  Patient will be given 30 cc/kg fluid bolus and will gain it labs and imaging to evaluate for potential source of patient.  Additional history obtained by reviewing patient's medical chart.  I have  independently ordered, reviewed and interpreted all labs and imaging:  Blood work thus far has been reassuring without leukocytosis or significant metabolic derangement.  No elevation in lactic acid, blood cultures pending, urinalysis with leukocytes and a few bacteria, but this is what patient's UA looks like chronically in setting of suprapubic catheter.  Chest x-ray continues to show bibasilar opacities concerning for persistent pneumonia, with patient's lack of improvement in symptoms concerns he may have failed outpatient therapy, will get CTA of the chest as well as CT abdomen pelvis to further evaluate pneumonia and evaluate abdominal pain.  Patient given fluids and broad-spectrum antibiotics as well as pain medication for chronic hip pain  At shift change CT imaging is pending, patient may require admission for persistent pneumonia, initial hypotension has resolved and now all vitals are stable.  At shift change care signed out to Dr. Ronnald Nian who will follow up on CTs and disposition appropriately  Final Clinical Impression(s) / ED Diagnoses Final diagnoses:  Multifocal pneumonia    Rx / DC Orders ED Discharge Orders    None       Janet Berlin 10/03/20 0159    Daleen Bo, MD 10/03/20 [phone]

## 2020-09-29 NOTE — ED Notes (Signed)
Called [facility] for patient to be transported back to home address. 3 Liters of Oxygen, pt not ambulatory. 6 ahead of patient at this time.

## 2020-09-29 NOTE — ED Notes (Signed)
[facility] called 10:30pm

## 2020-09-29 NOTE — ED Notes (Signed)
IV team at bedside 

## 2020-09-29 NOTE — ED Notes (Signed)
RN put in IV team consult and requested phlebotomy stick pt. RN was unsuccessful with IV

## 2020-09-29 NOTE — ED Provider Notes (Addendum)
I personally evaluated the patient during the encounter and completed a history, physical, procedures, medical decision making to contribute to the overall care of the patient and decision making for the patient briefly, the patient is a 55 y.o. 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  Just finished a course of antibiotics for presumed lung infection with Augmentin.  Patient overall has been having difficulty with eating and drinking due to some abdominal pain mostly in the lower area.  Sepsis work-up was initiated by previous team.  Thus far sepsis work-up is unremarkable.  No leukocytosis, no lactic acidosis.  Negative COVID-negative flu.  Chest x-ray overall unremarkable.  Patient was given broad-spectrum IV antibiotics due to presumed lung infection and failed outpatient therapy.  Patient denies any respiratory symptoms.  No cough.  On his home oxygen.  Talked with the wife on the phone and there is concerned that he might be having some chronic aspiration and is supposed to have a swallow study outpatient soon.  Patient has CT scan of his chest, abdomen, pelvis pending.  Patient prefers to be discharged home which I think can be reasonable as he does not appear to be septic.  Patient with chest x-ray that shows multifocal airspace opacities right worse than left.  Overall slightly worse when compared to CT scan 4 months ago.  Patient otherwise with unremarkable abdomen and pelvis CT.  Urinalysis overall equivocal.  Low suspicion for infectious process.  Urine culture has been sent.  I talked on the phone with infectious disease given his antibiotic history.  Overall patient does not want to be admitted.  He does not appear to be septic.  Chest x-ray/CT of chest could be consistent with chronic aspiration.  ID thinks that it would be  reasonable to admit for IV antibiotics due to possibly failed outpatient therapy but given his history could do a 2-week course of Augmentin therefore additionally add another 7 days to Augmentin that he is started.  Family and patient is amenable to this plan and I think that is reasonable as well as patient doesn't want to be admitted to the hospital.  He understands the risks and benefits of this.  IV antibiotics would likely help his infection improve faster.  Also he could get a swallow study while he is here but patient still prefers to be discharged.  Suspect that this could be inflammation/chronic aspiration as well.  Patient prescribed Augmentin.  Discharged in good condition.  Understands return precautions.  Blood cultures have been sent.  This chart was dictated using voice recognition software.  Despite best efforts to proofread,  errors can occur which can change the documentation meaning.     EKG Interpretation  Date/Time:  Tuesday September 29 2020 15:57:53 EDT Ventricular Rate:  77 PR Interval:  160 QRS Duration: 109 QT Interval:  427 QTC Calculation: 484 R Axis:   59 Text Interpretation: Sinus rhythm Probable left atrial enlargement Borderline prolonged QT interval Confirmed by Mancel Bale [phone]) on 09/29/2020 3:59:52 PM           Virgina Norfolk, DO 09/29/20 2210    Lockie Mola, Sharday Michl, DO 09/29/20 2215    Virgina Norfolk, DO 09/29/20 2216

## 2020-09-29 NOTE — ED Notes (Signed)
Patient transported to X-ray 

## 2020-09-30 MED ORDER — ACETAMINOPHEN 500 MG PO TABS
1000.0000 mg | ORAL_TABLET | Freq: Once | ORAL | Status: DC
  Filled 2020-09-30: qty 2

## 2020-09-30 NOTE — ED Notes (Signed)
Pt does not need additional Vancomycin per MD

## 2020-10-01 ENCOUNTER — Telehealth: Payer: Self-pay | Admitting: *Deleted

## 2020-10-01 LAB — URINE CULTURE: Culture: >=100000 — AB

## 2020-10-01 NOTE — Telephone Encounter (Signed)
TOC CM received call from pt's Worker's Comp CM, Laretta Bolster with [employer] [facility], [phone] fax [phone] pt states he does not have Rx and needed called into pharmacy. Will call Rx into [facility]. TOC CM contacted pt and spoke to his wife, [name]. Gave permission to fax progress note from ED visit to [facility] CM. Explained CM will call Rx into [facility]. She will be able to pick today. Isidoro Donning RN CCM, [facility] ED TOC CM [phone]

## 2020-10-02 ENCOUNTER — Telehealth: Payer: Self-pay | Admitting: Emergency Medicine

## 2020-10-02 NOTE — Telephone Encounter (Signed)
Post ED Visit - Positive Culture Follow-up  Culture report reviewed by antimicrobial stewardship pharmacist: [facility] []  , Pharm.D. []  Enzo Bi, Pharm.D., BCPS AQ-ID []  , Pharm.D., BCPS []  Celedonio Miyamoto, Pharm.D., BCPS []  [place], Garvin Fila.D., BCPS, AAHIVP []  , Pharm.D., BCPS, AAHIVP []  Georgina Pillion, PharmD, BCPS []  , PharmD, BCPS []  [place], PharmD, BCPS []  [address], PharmD []  , PharmD, BCPS []  Estella Husk, PharmD  Pharmacy Team []  Lysle Pearl, PharmD []  , PharmD []  Phillips Climes, PharmD []  , Rph []  Agapito Games) , PharmD []  Verlan Friends, PharmD []  , PharmD []  Mervyn Gay, PharmD []  , PharmD []  Vinnie Level, PharmD []  [facility], PharmD []  , PharmD []  Len Childs, PharmD   Positive urine culture Treated with none, asymptomatic, placed on augmentin for chronic aspiration,no further patient follow-up is required at this time.  10/02/2020, 11:24 AM

## 2020-10-02 NOTE — Telephone Encounter (Signed)
Yes, please schedule the modified barium swallow  - HD

## 2020-10-02 NOTE — Telephone Encounter (Signed)
Barium Swallow has been ordered. Arline Asp has been notified that the patient's wife will be contact to have the Barium swallow and that once he has had it done that they can call to schedule and appointment with Neuro Rehabilitation.

## 2020-10-04 LAB — CULTURE, BLOOD (SINGLE)
Culture: NO GROWTH
Special Requests: ADEQUATE

## 2020-10-10 ENCOUNTER — Other Ambulatory Visit: Payer: Self-pay | Admitting: Physical Medicine and Rehabilitation

## 2020-10-11 ENCOUNTER — Other Ambulatory Visit: Payer: Self-pay | Admitting: Physical Medicine and Rehabilitation

## 2020-10-13 ENCOUNTER — Other Ambulatory Visit: Payer: Self-pay

## 2020-10-13 ENCOUNTER — Ambulatory Visit (INDEPENDENT_AMBULATORY_CARE_PROVIDER_SITE_OTHER): Payer: No Typology Code available for payment source | Admitting: Surgical

## 2020-10-13 DIAGNOSIS — M86652 Other chronic osteomyelitis, left thigh: Secondary | ICD-10-CM

## 2020-10-13 DIAGNOSIS — S31809D Unspecified open wound of unspecified buttock, subsequent encounter: Secondary | ICD-10-CM | POA: Diagnosis not present

## 2020-10-13 DIAGNOSIS — T07XXXA Unspecified multiple injuries, initial encounter: Secondary | ICD-10-CM | POA: Diagnosis not present

## 2020-10-13 DIAGNOSIS — L98499 Non-pressure chronic ulcer of skin of other sites with unspecified severity: Secondary | ICD-10-CM
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
## 2020-10-14 ENCOUNTER — Inpatient Hospital Stay (HOSPITAL_COMMUNITY)
Admission: EM | Admit: 2020-10-14 | Discharge: 2020-10-18 | DRG: 177 | Disposition: A | Discharge status: Home / Self Care | Payer: No Typology Code available for payment source | Attending: Internal Medicine | Admitting: Internal Medicine

## 2020-10-14 ENCOUNTER — Emergency Department (HOSPITAL_COMMUNITY): Payer: No Typology Code available for payment source

## 2020-10-14 ENCOUNTER — Ambulatory Visit (HOSPITAL_COMMUNITY)
Admission: RE | Admit: 2020-10-14 | Discharge: 2020-10-14 | Disposition: A | Discharge status: Home / Self Care | Payer: Self-pay | Source: Ambulatory Visit | Attending: Gastroenterology | Admitting: Gastroenterology

## 2020-10-14 ENCOUNTER — Encounter (HOSPITAL_COMMUNITY): Payer: Self-pay

## 2020-10-14 DIAGNOSIS — Z930 Tracheostomy status: Secondary | ICD-10-CM

## 2020-10-14 DIAGNOSIS — L89159 Pressure ulcer of sacral region, unspecified stage: Secondary | ICD-10-CM | POA: Diagnosis present

## 2020-10-14 DIAGNOSIS — R0902 Hypoxemia: Secondary | ICD-10-CM

## 2020-10-14 DIAGNOSIS — Z79899 Other long term (current) drug therapy: Secondary | ICD-10-CM

## 2020-10-14 DIAGNOSIS — K2289 Other specified disease of esophagus: Secondary | ICD-10-CM | POA: Diagnosis present

## 2020-10-14 DIAGNOSIS — G8220 Paraplegia, unspecified: Secondary | ICD-10-CM | POA: Diagnosis present

## 2020-10-14 DIAGNOSIS — K219 Gastro-esophageal reflux disease without esophagitis: Secondary | ICD-10-CM | POA: Diagnosis present

## 2020-10-14 DIAGNOSIS — Z20822 Contact with and (suspected) exposure to covid-19: Secondary | ICD-10-CM | POA: Diagnosis present

## 2020-10-14 DIAGNOSIS — Z885 Allergy status to narcotic agent status: Secondary | ICD-10-CM | POA: Diagnosis not present

## 2020-10-14 DIAGNOSIS — Z7401 Bed confinement status: Secondary | ICD-10-CM | POA: Diagnosis not present

## 2020-10-14 DIAGNOSIS — I10 Essential (primary) hypertension: Secondary | ICD-10-CM | POA: Diagnosis present

## 2020-10-14 DIAGNOSIS — R6520 Severe sepsis without septic shock: Secondary | ICD-10-CM

## 2020-10-14 DIAGNOSIS — K5909 Other constipation: Secondary | ICD-10-CM | POA: Diagnosis present

## 2020-10-14 DIAGNOSIS — L8990 Pressure ulcer of unspecified site, unspecified stage: Secondary | ICD-10-CM | POA: Diagnosis present

## 2020-10-14 DIAGNOSIS — F32A Depression, unspecified: Secondary | ICD-10-CM | POA: Diagnosis present

## 2020-10-14 DIAGNOSIS — Z792 Long term (current) use of antibiotics: Secondary | ICD-10-CM

## 2020-10-14 DIAGNOSIS — F419 Anxiety disorder, unspecified: Secondary | ICD-10-CM | POA: Diagnosis present

## 2020-10-14 DIAGNOSIS — J9601 Acute respiratory failure with hypoxia: Secondary | ICD-10-CM

## 2020-10-14 DIAGNOSIS — A419 Sepsis, unspecified organism: Secondary | ICD-10-CM | POA: Diagnosis present

## 2020-10-14 DIAGNOSIS — J9621 Acute and chronic respiratory failure with hypoxia: Secondary | ICD-10-CM | POA: Diagnosis present

## 2020-10-14 DIAGNOSIS — Z993 Dependence on wheelchair: Secondary | ICD-10-CM

## 2020-10-14 DIAGNOSIS — Z9359 Other cystostomy status: Secondary | ICD-10-CM

## 2020-10-14 DIAGNOSIS — Z803 Family history of malignant neoplasm of breast: Secondary | ICD-10-CM | POA: Diagnosis not present

## 2020-10-14 DIAGNOSIS — Z86718 Personal history of other venous thrombosis and embolism: Secondary | ICD-10-CM

## 2020-10-14 DIAGNOSIS — J690 Pneumonitis due to inhalation of food and vomit: Principal | ICD-10-CM | POA: Diagnosis present

## 2020-10-14 DIAGNOSIS — Z87891 Personal history of nicotine dependence: Secondary | ICD-10-CM

## 2020-10-14 DIAGNOSIS — J189 Pneumonia, unspecified organism: Secondary | ICD-10-CM | POA: Diagnosis not present

## 2020-10-14 DIAGNOSIS — E876 Hypokalemia: Secondary | ICD-10-CM | POA: Diagnosis present

## 2020-10-14 DIAGNOSIS — R059 Cough, unspecified: Secondary | ICD-10-CM

## 2020-10-14 DIAGNOSIS — F339 Major depressive disorder, recurrent, unspecified: Secondary | ICD-10-CM | POA: Diagnosis present

## 2020-10-14 DIAGNOSIS — R1310 Dysphagia, unspecified: Secondary | ICD-10-CM | POA: Diagnosis present

## 2020-10-14 DIAGNOSIS — G894 Chronic pain syndrome: Secondary | ICD-10-CM | POA: Diagnosis present

## 2020-10-14 DIAGNOSIS — M86652 Other chronic osteomyelitis, left thigh: Secondary | ICD-10-CM | POA: Diagnosis present

## 2020-10-14 DIAGNOSIS — R8290 Unspecified abnormal findings in urine: Secondary | ICD-10-CM | POA: Diagnosis present

## 2020-10-14 DIAGNOSIS — F1120 Opioid dependence, uncomplicated: Secondary | ICD-10-CM | POA: Diagnosis present

## 2020-10-14 DIAGNOSIS — R112 Nausea with vomiting, unspecified: Secondary | ICD-10-CM | POA: Diagnosis present

## 2020-10-14 DIAGNOSIS — Z933 Colostomy status: Secondary | ICD-10-CM | POA: Diagnosis not present

## 2020-10-14 DIAGNOSIS — J188 Other pneumonia, unspecified organism: Secondary | ICD-10-CM | POA: Diagnosis present

## 2020-10-14 LAB — COMPREHENSIVE METABOLIC PANEL
ALT: 35 U/L (ref 0–44)
AST: 27 U/L (ref 15–41)
Albumin: 3 g/dL — ABNORMAL LOW (ref 3.5–5.0)
Alkaline Phosphatase: 143 U/L — ABNORMAL HIGH (ref 38–126)
Anion gap: 9 (ref 5–15)
BUN: 12 mg/dL (ref 6–20)
CO2: 24 mmol/L (ref 22–32)
Calcium: 9.8 mg/dL (ref 8.9–10.3)
Chloride: 102 mmol/L (ref 98–111)
Creatinine, Ser: 0.53 mg/dL — ABNORMAL LOW (ref 0.61–1.24)
GFR, Estimated: >60 mL/min (ref >60)
Glucose, Bld: 134 mg/dL — ABNORMAL HIGH (ref 70–99)
Potassium: 4 mmol/L (ref 3.5–5.1)
Sodium: 135 mmol/L (ref 135–145)
Total Bilirubin: 0.7 mg/dL (ref 0.3–1.2)
Total Protein: 7.1 g/dL (ref 6.5–8.1)

## 2020-10-14 LAB — CBC WITH DIFFERENTIAL/PLATELET
Abs Immature Granulocytes: 0.05 10*3/uL (ref 0.00–0.07)
Basophils Absolute: 0 10*3/uL (ref 0.0–0.1)
Basophils Relative: 0 %
Eosinophils Absolute: 0 10*3/uL (ref 0.0–0.5)
Eosinophils Relative: 0 %
HCT: 39.8 % (ref 39.0–52.0)
Hemoglobin: 12.2 g/dL — ABNORMAL LOW (ref 13.0–17.0)
Immature Granulocytes: 0 %
Lymphocytes Relative: 5 %
Lymphs Abs: 0.5 10*3/uL — ABNORMAL LOW (ref 0.7–4.0)
MCH: 29.9 pg (ref 26.0–34.0)
MCHC: 30.7 g/dL (ref 30.0–36.0)
MCV: 97.5 fL (ref 80.0–100.0)
Monocytes Absolute: 0.3 10*3/uL (ref 0.1–1.0)
Monocytes Relative: 3 %
Neutro Abs: 10.4 10*3/uL — ABNORMAL HIGH (ref 1.7–7.7)
Neutrophils Relative %: 92 %
Platelets: 258 10*3/uL (ref 150–400)
RBC: 4.08 MIL/uL — ABNORMAL LOW (ref 4.22–5.81)
RDW: 12.3 % (ref 11.5–15.5)
WBC: 11.4 10*3/uL — ABNORMAL HIGH (ref 4.0–10.5)
nRBC: 0 % (ref 0.0–0.2)

## 2020-10-14 LAB — I-STAT CHEM 8, ED
BUN: 12 mg/dL (ref 6–20)
Calcium, Ion: 1.23 mmol/L (ref 1.15–1.40)
Chloride: 103 mmol/L (ref 98–111)
Creatinine, Ser: 0.4 mg/dL — ABNORMAL LOW (ref 0.61–1.24)
Glucose, Bld: 132 mg/dL — ABNORMAL HIGH (ref 70–99)
HCT: 37 % — ABNORMAL LOW (ref 39.0–52.0)
Hemoglobin: 12.6 g/dL — ABNORMAL LOW (ref 13.0–17.0)
Potassium: 3.9 mmol/L (ref 3.5–5.1)
Sodium: 137 mmol/L (ref 135–145)
TCO2: 24 mmol/L (ref 22–32)

## 2020-10-14 LAB — I-STAT VENOUS BLOOD GAS, ED
Acid-Base Excess: 4 mmol/L — ABNORMAL HIGH (ref 0.0–2.0)
Bicarbonate: 27.1 mmol/L (ref 20.0–28.0)
Calcium, Ion: 1.21 mmol/L (ref 1.15–1.40)
HCT: 37 % — ABNORMAL LOW (ref 39.0–52.0)
Hemoglobin: 12.6 g/dL — ABNORMAL LOW (ref 13.0–17.0)
O2 Saturation: 98 %
Potassium: 3.9 mmol/L (ref 3.5–5.1)
Sodium: 137 mmol/L (ref 135–145)
TCO2: 28 mmol/L (ref 22–32)
pCO2, Ven: 34.8 mmHg — ABNORMAL LOW (ref 44.0–60.0)
pH, Ven: 7.499 — ABNORMAL HIGH (ref 7.250–7.430)
pO2, Ven: 102 mmHg — ABNORMAL HIGH (ref 32.0–45.0)

## 2020-10-14 LAB — URINALYSIS, ROUTINE W REFLEX MICROSCOPIC
Bilirubin Urine: NEGATIVE
Glucose, UA: NEGATIVE mg/dL
Ketones, ur: NEGATIVE mg/dL
Nitrite: NEGATIVE
Protein, ur: 100 mg/dL — AB
RBC / HPF: >50 RBC/hpf — ABNORMAL HIGH (ref 0–5)
Specific Gravity, Urine: 1.021 (ref 1.005–1.030)
WBC, UA: >50 WBC/hpf — ABNORMAL HIGH (ref 0–5)
pH: 5 (ref 5.0–8.0)

## 2020-10-14 LAB — MAGNESIUM: Magnesium: 1.6 mg/dL — ABNORMAL LOW (ref 1.7–2.4)

## 2020-10-14 LAB — RESP PANEL BY RT-PCR (FLU A&B, COVID) ARPGX2
Influenza A by PCR: NEGATIVE
Influenza B by PCR: NEGATIVE
SARS Coronavirus 2 by RT PCR: NEGATIVE

## 2020-10-14 LAB — PROTIME-INR
INR: 1.1 (ref 0.8–1.2)
Prothrombin Time: 14 seconds (ref 11.4–15.2)

## 2020-10-14 LAB — APTT: aPTT: 33 seconds (ref 24–36)

## 2020-10-14 LAB — LIPASE, BLOOD: Lipase: 21 U/L (ref 11–51)

## 2020-10-14 LAB — LACTIC ACID, PLASMA
Lactic Acid, Venous: 1.2 mmol/L (ref 0.5–1.9)
Lactic Acid, Venous: 1.6 mmol/L (ref 0.5–1.9)

## 2020-10-14 LAB — BRAIN NATRIURETIC PEPTIDE: B Natriuretic Peptide: 56.2 pg/mL (ref 0.0–100.0)

## 2020-10-14 LAB — POC OCCULT BLOOD, ED: Fecal Occult Bld: NEGATIVE

## 2020-10-14 MED ORDER — NALOXONE HCL 4 MG/0.1ML NA LIQD
1.0000 | Freq: Every day | NASAL | Status: DC | PRN
  Filled 2020-10-14: qty 8

## 2020-10-14 MED ORDER — LIDOCAINE 5 % EX PTCH
1.0000 | MEDICATED_PATCH | CUTANEOUS | Status: DC
  Filled 2020-10-14 (×2): qty 1

## 2020-10-14 MED ORDER — ALBUTEROL SULFATE (2.5 MG/3ML) 0.083% IN NEBU: 2.5000 mg | INHALATION_SOLUTION | RESPIRATORY_TRACT | Status: DC | PRN

## 2020-10-14 MED ORDER — VANCOMYCIN 50 MG/ML ORAL SOLUTION
125.0000 mg | Freq: Four times a day (QID) | ORAL | Status: DC
  Administered 2020-10-14 – 2020-10-17 (×7): 125 mg via ORAL
  Filled 2020-10-14 (×15): qty 2.5

## 2020-10-14 MED ORDER — LACTATED RINGERS IV BOLUS (SEPSIS)
1750.0000 mL | Freq: Once | INTRAVENOUS | Status: AC
  Administered 2020-10-14: 1750 mL via INTRAVENOUS

## 2020-10-14 MED ORDER — ONDANSETRON HCL 4 MG/2ML IJ SOLN
4.0000 mg | Freq: Four times a day (QID) | INTRAMUSCULAR | Status: DC | PRN
  Administered 2020-10-15 – 2020-10-17 (×3): 4 mg via INTRAVENOUS
  Filled 2020-10-14 (×3): qty 2

## 2020-10-14 MED ORDER — SODIUM CHLORIDE 0.9 % IV SOLN: Freq: Once | INTRAVENOUS | Status: DC

## 2020-10-14 MED ORDER — FENTANYL 75 MCG/HR TD PT72
1.0000 | MEDICATED_PATCH | TRANSDERMAL | Status: DC
Start: 2020-10-14 — End: 2020-10-18
  Administered 2020-10-14 – 2020-10-17 (×2): 1 via TRANSDERMAL
  Filled 2020-10-14 (×2): qty 1

## 2020-10-14 MED ORDER — MIRABEGRON ER 25 MG PO TB24
50.0000 mg | ORAL_TABLET | Freq: Every day | ORAL | Status: DC
  Administered 2020-10-14 – 2020-10-18 (×4): 50 mg via ORAL
  Filled 2020-10-14 (×3): qty 2
  Filled 2020-10-14: qty 1
  Filled 2020-10-14: qty 2

## 2020-10-14 MED ORDER — GABAPENTIN 300 MG PO CAPS
300.0000 mg | ORAL_CAPSULE | Freq: Three times a day (TID) | ORAL | Status: DC
  Administered 2020-10-14 – 2020-10-18 (×10): 300 mg via ORAL
  Filled 2020-10-14 (×10): qty 1

## 2020-10-14 MED ORDER — OXYCODONE-ACETAMINOPHEN 5-325 MG PO TABS
1.0000 | ORAL_TABLET | ORAL | Status: DC | PRN
Start: 2020-10-14 — End: 2020-10-18
  Administered 2020-10-15 – 2020-10-18 (×9): 1 via ORAL
  Filled 2020-10-14 (×10): qty 1

## 2020-10-14 MED ORDER — OXYCODONE HCL 5 MG PO TABS
5.0000 mg | ORAL_TABLET | ORAL | Status: DC | PRN
Start: 2020-10-14 — End: 2020-10-18
  Administered 2020-10-15 – 2020-10-16 (×5): 5 mg via ORAL
  Filled 2020-10-14 (×6): qty 1

## 2020-10-14 MED ORDER — TRAZODONE HCL 100 MG PO TABS
200.0000 mg | ORAL_TABLET | Freq: Every day | ORAL | Status: DC
  Administered 2020-10-14 – 2020-10-17 (×3): 200 mg via ORAL
  Filled 2020-10-14 (×4): qty 2

## 2020-10-14 MED ORDER — NALOXEGOL OXALATE 12.5 MG PO TABS
12.5000 mg | ORAL_TABLET | Freq: Every evening | ORAL | Status: DC
  Administered 2020-10-14 – 2020-10-17 (×3): 12.5 mg via ORAL
  Filled 2020-10-14 (×4): qty 1

## 2020-10-14 MED ORDER — ENOXAPARIN SODIUM 40 MG/0.4ML IJ SOSY
40.0000 mg | PREFILLED_SYRINGE | INTRAMUSCULAR | Status: DC
  Administered 2020-10-14 – 2020-10-17 (×4): 40 mg via SUBCUTANEOUS
  Filled 2020-10-14 (×4): qty 0.4

## 2020-10-14 MED ORDER — RISAQUAD PO CAPS
1.0000 | ORAL_CAPSULE | Freq: Every day | ORAL | Status: DC
  Administered 2020-10-14 – 2020-10-18 (×4): 1 via ORAL
  Filled 2020-10-14 (×4): qty 1

## 2020-10-14 MED ORDER — LACTATED RINGERS IV SOLN: INTRAVENOUS | Status: AC

## 2020-10-14 MED ORDER — PRO-STAT 64 PO LIQD: 30.0000 mL | Freq: Three times a day (TID) | ORAL | Status: DC

## 2020-10-14 MED ORDER — SODIUM CHLORIDE 0.9 % IV SOLN
100.0000 mg | Freq: Two times a day (BID) | INTRAVENOUS | Status: DC
  Administered 2020-10-14 – 2020-10-15 (×4): 100 mg via INTRAVENOUS
  Filled 2020-10-14 (×6): qty 100

## 2020-10-14 MED ORDER — VANCOMYCIN HCL 1250 MG/250ML IV SOLN
1250.0000 mg | Freq: Two times a day (BID) | INTRAVENOUS | Status: DC
  Administered 2020-10-15 – 2020-10-16 (×3): 1250 mg via INTRAVENOUS
  Filled 2020-10-14 (×4): qty 250

## 2020-10-14 MED ORDER — SENNOSIDES-DOCUSATE SODIUM 8.6-50 MG PO TABS
2.0000 | ORAL_TABLET | Freq: Two times a day (BID) | ORAL | Status: DC
  Administered 2020-10-16 – 2020-10-17 (×2): 2 via ORAL
  Filled 2020-10-14 (×5): qty 2

## 2020-10-14 MED ORDER — CLONAZEPAM 0.25 MG PO TBDP
0.2500 mg | ORAL_TABLET | Freq: Four times a day (QID) | ORAL | Status: DC | PRN
  Administered 2020-10-14 – 2020-10-15 (×2): 0.25 mg via ORAL
  Filled 2020-10-14 (×3): qty 1

## 2020-10-14 MED ORDER — SODIUM CHLORIDE 0.9 % IV SOLN: Freq: Once | INTRAVENOUS | Status: AC

## 2020-10-14 MED ORDER — FAMOTIDINE 20 MG PO TABS
20.0000 mg | ORAL_TABLET | Freq: Every day | ORAL | Status: DC
  Administered 2020-10-14 – 2020-10-18 (×4): 20 mg via ORAL
  Filled 2020-10-14 (×5): qty 1

## 2020-10-14 MED ORDER — OXYCODONE HCL 5 MG PO TABS
5.0000 mg | ORAL_TABLET | ORAL | Status: DC | PRN
  Administered 2020-10-14: 5 mg via ORAL
  Filled 2020-10-14: qty 1

## 2020-10-14 MED ORDER — OXYCODONE-ACETAMINOPHEN 5-325 MG PO TABS: 1.0000 | ORAL_TABLET | ORAL | Status: DC | PRN

## 2020-10-14 MED ORDER — MAGNESIUM SULFATE 2 GM/50ML IV SOLN
2.0000 g | Freq: Once | INTRAVENOUS | Status: AC
  Administered 2020-10-14: 2 g via INTRAVENOUS
  Filled 2020-10-14: qty 50

## 2020-10-14 MED ORDER — OXYCODONE-ACETAMINOPHEN 10-325 MG PO TABS: 1.0000 | ORAL_TABLET | ORAL | Status: DC | PRN

## 2020-10-14 MED ORDER — VANCOMYCIN HCL 125 MG PO CAPS
125.0000 mg | ORAL_CAPSULE | Freq: Four times a day (QID) | ORAL | Status: DC
  Filled 2020-10-14: qty 1

## 2020-10-14 MED ORDER — LORAZEPAM 2 MG/ML IJ SOLN
1.0000 mg | Freq: Once | INTRAMUSCULAR | Status: AC
  Administered 2020-10-14: 1 mg via INTRAVENOUS
  Filled 2020-10-14: qty 1

## 2020-10-14 MED ORDER — OXYBUTYNIN CHLORIDE 5 MG PO TABS
5.0000 mg | ORAL_TABLET | Freq: Three times a day (TID) | ORAL | Status: DC
  Administered 2020-10-14 – 2020-10-18 (×9): 5 mg via ORAL
  Filled 2020-10-14 (×10): qty 1

## 2020-10-14 MED ORDER — PROSOURCE PLUS PO LIQD
30.0000 mL | Freq: Three times a day (TID) | ORAL | Status: DC
  Administered 2020-10-16 – 2020-10-17 (×7): 30 mL via ORAL
  Filled 2020-10-14 (×8): qty 30

## 2020-10-14 MED ORDER — VANCOMYCIN HCL 1500 MG/300ML IV SOLN
1500.0000 mg | Freq: Once | INTRAVENOUS | Status: AC
  Administered 2020-10-14: 1500 mg via INTRAVENOUS
  Filled 2020-10-14: qty 300

## 2020-10-14 MED ORDER — SODIUM CHLORIDE 0.9 % IV SOLN
2.0000 g | Freq: Three times a day (TID) | INTRAVENOUS | Status: DC
  Administered 2020-10-14 – 2020-10-18 (×11): 2 g via INTRAVENOUS
  Filled 2020-10-14 (×14): qty 2

## 2020-10-14 MED ORDER — PAROXETINE HCL 20 MG PO TABS
40.0000 mg | ORAL_TABLET | Freq: Every day | ORAL | Status: DC
  Administered 2020-10-14 – 2020-10-17 (×4): 40 mg via ORAL
  Filled 2020-10-14 (×4): qty 2

## 2020-10-14 MED ORDER — METHOCARBAMOL 500 MG PO TABS
500.0000 mg | ORAL_TABLET | Freq: Four times a day (QID) | ORAL | Status: DC | PRN
  Administered 2020-10-16: 500 mg via ORAL
  Filled 2020-10-14: qty 1

## 2020-10-14 MED ORDER — LACTATED RINGERS IV BOLUS (SEPSIS)
500.0000 mL | Freq: Once | INTRAVENOUS | Status: AC
  Administered 2020-10-14: 500 mL via INTRAVENOUS

## 2020-10-14 MED ORDER — MELATONIN 3 MG PO TABS
3.0000 mg | ORAL_TABLET | Freq: Every day | ORAL | Status: DC
  Administered 2020-10-14 – 2020-10-17 (×4): 3 mg via ORAL
  Filled 2020-10-14 (×4): qty 1

## 2020-10-14 MED ORDER — DRONABINOL 2.5 MG PO CAPS
2.5000 mg | ORAL_CAPSULE | Freq: Two times a day (BID) | ORAL | Status: DC | PRN
  Filled 2020-10-14: qty 1

## 2020-10-14 MED ORDER — SODIUM CHLORIDE 0.9% FLUSH
3.0000 mL | Freq: Two times a day (BID) | INTRAVENOUS | Status: DC
  Administered 2020-10-14 – 2020-10-18 (×7): 3 mL via INTRAVENOUS

## 2020-10-14 MED ORDER — IOHEXOL 350 MG/ML SOLN
100.0000 mL | Freq: Once | INTRAVENOUS | Status: AC | PRN
  Administered 2020-10-14: 100 mL via INTRAVENOUS

## 2020-10-14 MED ORDER — HYDROMORPHONE HCL 1 MG/ML IJ SOLN
1.0000 mg | Freq: Once | INTRAMUSCULAR | Status: AC
  Administered 2020-10-14: 1 mg via INTRAVENOUS
  Filled 2020-10-14: qty 1

## 2020-10-14 NOTE — ED Notes (Addendum)
Patient transported to CT 

## 2020-10-14 NOTE — ED Notes (Signed)
ED Provider at bedside. 

## 2020-10-14 NOTE — ED Notes (Signed)
RN attempted to call report to 2W x2

## 2020-10-14 NOTE — ED Triage Notes (Signed)
Cough, nausea, vomiting since last night. Dx with pneumonia 2-3 weeks. Just finished antibiotics. O2 at home at 3LPM.

## 2020-10-14 NOTE — ED Provider Notes (Signed)
[facility] Provider Note   CSN: [id number] Arrival date & time: 10/14/20  0757     History Chief Complaint  Patient presents with  . Cough    Ronnald CollumJames B Mcginnis is a 55 y.o. male.  HPI       Ronnald CollumJames B Savell is a 55 y.o. male, with a history of previous bacteremia, C. difficile, chronic pain syndrome, chronic osteomyelitis, DVT, HTN, bedbound due to traumatic injury, suprapubic catheter, colostomy, presenting to the ED with cough and shortness of breath recurring last night. Also endorses nausea and vomiting beginning last night around 11 PM. He has some abdominal pain, but description of this is vague.  Seems to be generalized. Patient was seen in the ED on April 11, started on Augmentin for pneumonia. Seen again in the ED April 19 reporting continued cough, shortness of breath, generalized weakness, vomiting, and abdominal pain.  It was recommended at that time that he be admitted for IV antibiotics due to worsened appearance of multifocal pneumonia.  Patient declined and he was discharged with an extended course of Augmentin. Patient voices compliance with this medication. Patient is chronically on 3 L supplemental O2.  He states he has continued to get good output in his colostomy with no evidence of bright red blood or melena. Good output from his suprapubic catheter.  This catheter was last changed in the middle of April. He has been on Cipro 500 mg twice daily for treatment of wound infection for several weeks.  He is also on daily oral vancomycin therapy for prevention of C. difficile.  Denies measured fever, chest pain, headaches, hematuria, acute back pain, numbness, focal weakness, hematemesis, or any other complaints.   Past Medical History:  Diagnosis Date  . Acute on chronic respiratory failure with hypoxia (HCC) 06/2018   trach removed 11-16-2018, on vent from jan until may 2020 - uses albuterol prn  . Anxiety   .  Bacteremia due to Pseudomonas 06/2018  . Chronic osteomyelitis (HCC)   . Chronic pain syndrome   . Clostridium difficile colitis 10/30/2019   tx with abx   . Depression   . DVT (deep venous thrombosis) (HCC) 2020   right brachial post PICC line  . History of blood transfusion 06/2018  . History of Clostridioides difficile colitis   . History of kidney stones   . Hypertension    norvasc d/c by pcp on 11/05/19  . Multiple traumatic injuries   . Penile pain 11/18/2019  . Pneumonia 11/2009   2020 x 2  . Dan HumphreysWalker as ambulation aid   . Wheelchair bound    electric  . Wound discharge    left hip wound with bloody/clear drainage change dressing q day surgilube with gauze, between legs wound using calcium algenate pad bid    Patient Active Problem List   Diagnosis Date Noted  . Multifocal pneumonia 10/14/2020  . 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 Respiratory failure with hypoxia (HCC) 07/02/2020  . Dysphagia 07/02/2020  . Therapeutic opioid induced constipation 04/11/2020  . Status post peripherally inserted central catheter (PICC) central line placement 03/17/2020  . Skin-picking disorder 03/12/2020  . CHF (congestive heart failure) (HCC) 01/27/2020  . Bone infection of pelvis (HCC) 12/12/2019  . Recurrent pain of right knee 11/08/2019  . Elevated blood pressure reading 11/05/2019  . Acute osteomyelitis of pelvic region and thigh (HCC)   . Acute deep vein thrombosis (DVT) of brachial vein of right upper extremity (HCC) 11/01/2019  . Myofascial  pain dysfunction syndrome 08/05/2019  . Protein-calorie malnutrition, severe 06/01/2019  . Complicated urinary tract infection 05/30/2019  . Chronic osteomyelitis of pelvis, left (HCC) 03/07/2019  . Wound infection, posttraumatic 02/28/2019  . Hydronephrosis concurrent with and due to calculi of kidney and ureter 02/28/2019  . GERD (gastroesophageal reflux disease) 02/28/2019  . Suprapubic catheter (HCC) 02/28/2019  . Protein-calorie  malnutrition, moderate (HCC) 02/28/2019  . 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 Reactive depression 01/29/2019  . Chronic pain due to trauma 01/29/2019  . Neuropathic pain 01/29/2019  . Colostomy status (HCC) 01/14/2019  . Insomnia 01/14/2019  . Tachycardia 01/14/2019  . Current use of proton pump inhibitor 01/14/2019  . Wound of buttock 12/28/2018  . Anxiety and depression 12/17/2018  . Debility 11/23/2018  . Chronic pain syndrome   . Multiple traumatic injuries   . Pressure injury of skin 08/13/2018  . Urethral injury 07/13/2018    Past Surgical History:  Procedure Laterality Date  . APPLICATION OF A-CELL OF BACK N/A 08/06/2018   Procedure: Application Of A-Cell Of Back;  Surgeon: Peggye Form, DO;  [facility]: [facility];  Service: Plastics;  Laterality: N/A;  . APPLICATION OF A-CELL OF EXTREMITY Left 08/06/2018   Procedure: Application Of A-Cell Of Extremity;  Surgeon: Peggye Form, DO;  Location: [facility];  Service: Plastics;  Laterality: Left;  . APPLICATION OF A-CELL OF EXTREMITY Left 09/18/2019   Procedure: APPLICATION OF A-CELL OF EXTREMITY;  Surgeon: Peggye Form, DO;  Location: [facility];  Service: Plastics;  Laterality: Left;  . APPLICATION OF WOUND VAC  07/12/2018   Procedure: Application Of Wound Vac to the Left Thigh and Scrotum.;  Surgeon: Roby Lofts, MD;  Location: [facility];  Service: Orthopedics;;  . APPLICATION OF WOUND VAC  07/10/2018   Procedure: Application Of Wound Vac;  Surgeon: Berna Bue, MD;  Location: [facility];  Service: General;;  . COLON SURGERY  2020   colostomy  . COLOSTOMY N/A 07/23/2018   Procedure: COLOSTOMY;  Surgeon: Violeta Gelinas, MD;  Location: [facility] OR;  Service: General;  Laterality: N/A;  . CYSTOSCOPY W/ URETERAL STENT PLACEMENT N/A 07/15/2018   Procedure: RETROGRADE URETHROGRAM;  Surgeon: Marcine Matar, MD;  Location[facility];  Service: Urology;  Laterality: N/A;  . CYSTOSCOPY WITH LITHOLAPAXY N/A  05/06/2019   Procedure: CYSTOSCOPY BASKET BLADDER STONE EXTRACTION;  Surgeon: Malen Gauze, MD;  [facility];  Service: Urology;  Laterality: N/A;  30 MINS  . CYSTOSTOMY N/A 05/06/2019   Procedure: REPLACEMENT OF SUPRAPUBIC CATHETER;  Surgeon: Malen Gauze, MD;  Location: [facility];  Service: Urology;  Laterality: N/A;  . DEBRIDEMENT AND CLOSURE WOUND Left 03/04/2019   Procedure: Excision of hip wound with placement of Acell;  Surgeon: Peggye Form, DO;  [facility];  Service: Plastics;  Laterality: Left;  . ESOPHAGOGASTRODUODENOSCOPY N/A 08/14/2018   Procedure: ESOPHAGOGASTRODUODENOSCOPY (EGD);  Surgeon: Violeta Gelinas, MD;  Location: [facility] ENDOSCOPY;  Service: General;  Laterality: N/A;  bedside  . FACIAL RECONSTRUCTION SURGERY     X 2--once as a teenager and second time in his 57's  . HARDWARE REMOVAL Left 03/04/2019   Procedure: Left Hip Hardware Removal;  Surgeon: Roby Lofts, MD;  [facility];  Service: Orthopedics;  Laterality: Left;  . HIP PINNING,CANNULATED Left 07/12/2018   Procedure: CANNULATED HIP PINNING;  Surgeon: Roby Lofts, MD;  [facility]: [facility];  Service: Orthopedics;  Laterality: Left;  . HIP  SURGERY    . HOLMIUM LASER APPLICATION Right 07/15/2019   Procedure: HOLMIUM LASER APPLICATION;  Surgeon: Malen Gauze, MD;  Location: [facility];  Service: Urology;  Laterality: Right;  . I & D EXTREMITY Left 07/25/2018   Procedure: Debridement of buttock, scrotum and left leg, placement of acell and vac;  Surgeon: Peggye Form, DO;  [facility];  Service: Plastics;  Laterality: Left;  . I & D EXTREMITY N/A 08/06/2018   Procedure: Debridement of buttock, scrotum and left leg;  Surgeon: Peggye Form, DO;  [facility];  Service: Plastics;  Laterality: N/A;  . I & D EXTREMITY N/A 08/13/2018   Procedure: Debridement of buttock, scrotum and left leg, placement of acell and vac;   Surgeon: Peggye Form, DO;  [facility]: [facility];  Service: Plastics;  Laterality: N/A;  90 min, please  . INCISION AND DRAINAGE HIP Left 09/18/2019   Procedure: IRRIGATION AND DEBRIDEMENT HIP/ PELVIS WITH WOUND VAC PLACEMENT;  Surgeon: Roby Lofts, MD;  [facility]: [facility];  Service: Orthopedics;  Laterality: Left;  . INCISION AND DRAINAGE OF WOUND N/A 07/18/2018   Procedure: Debridement of left leg, buttocks and scrotal wound with placement of acell and Flexiseal;  Surgeon: Peggye Form, DO;  [facility]: [facility];  Service: Plastics;  Laterality: N/A;  . INCISION AND DRAINAGE OF WOUND Left 08/29/2018   Procedure: Debridement of buttock, scrotum and left leg, placement of acell and vac;  Surgeon: Peggye Form, DO;  [facility]: [facility];  Service: Plastics;  Laterality: Left;  75 min, please  . INCISION AND DRAINAGE OF WOUND Bilateral 10/23/2018   Procedure: DEBRIDEMENT OF BUTTOCK,SCROTUM, AND LEG WOUNDS WITH PLACEMENT OF ACELL- BILATERAL 90 MIN;  Surgeon: Peggye Form, DO;  Location: [facility];  Service: Plastics;  Laterality: Bilateral;  . IR ANGIOGRAM PELVIS SELECTIVE OR SUPRASELECTIVE  07/10/2018  . IR ANGIOGRAM PELVIS SELECTIVE OR SUPRASELECTIVE  07/10/2018  . IR ANGIOGRAM SELECTIVE EACH ADDITIONAL VESSEL  07/10/2018  . IR EMBO ART  VEN HEMORR LYMPH EXTRAV  INC GUIDE ROADMAPPING  07/10/2018  . IR NEPHROSTOMY PLACEMENT LEFT  04/05/2019  . IR NEPHROSTOMY PLACEMENT RIGHT  05/31/2019  . IR US GUIDE BX ASP/DRAIN  07/10/2018  . IR US GUIDE VASC ACCESS RIGHT  07/10/2018  . IR VENO/EXT/UNI LEFT  07/10/2018  . IRRIGATION AND DEBRIDEMENT OF WOUND WITH SPLIT THICKNESS SKIN GRAFT Left 09/19/2018   Procedure: Debridement of gluteal wound with placement of acell to left leg.;  Surgeon: Peggye Form, DO;  [facility];  Service: Plastics;  Laterality: Left;  2.5 hours, please  . LAPAROTOMY N/A 07/12/2018   Procedure: EXPLORATORY LAPAROTOMY;  Surgeon: Violeta Gelinas, MD;  Location: [facility] OR;   Service: General;  Laterality: N/A;  . LAPAROTOMY N/A 07/15/2018   Procedure: WOUND EXPLORATION; CLOSURE OF ABDOMEN;  Surgeon: Violeta Gelinas, MD;  Location: [facility] OR;  Service: General;  Laterality: N/A;  . LAPAROTOMY  07/10/2018   Procedure: Exploratory Laparotomy;  Surgeon: Berna Bue, MD;  Location: [facility] OR;  Service: General;;  . MASS EXCISION Left 09/18/2019   Procedure: EXCISION UPPER LEFT INNER THIGH WOUND;  Surgeon: Peggye Form, DO;  [facility];  Service: Plastics;  Laterality: Left;  . NEPHROLITHOTOMY Right 07/15/2019   Procedure: NEPHROLITHOTOMY PERCUTANEOUS;  Surgeon: Malen Gauze, MD;  Location: [facility];  Service: Urology;  Laterality: Right;  90 MINS  . PEG PLACEMENT N/A 08/14/2018   Procedure: PERCUTANEOUS ENDOSCOPIC  GASTROSTOMY (PEG) PLACEMENT;  Surgeon: Violeta Gelinas, MD;  Location: [facility] ENDOSCOPY;  Service: General;  Laterality: N/A;  . PERCUTANEOUS TRACHEOSTOMY N/A 08/02/2018   Procedure: PERCUTANEOUS TRACHEOSTOMY;  Surgeon: Violeta Gelinas, MD;  Location: [facility] OR;  Service: General;  Laterality: N/A;  . RADIOLOGY WITH ANESTHESIA N/A 07/10/2018   Procedure: IR WITH ANESTHESIA;  Surgeon: Simonne Come, MD;  Location: [facility] OR;  Service: Radiology;  Laterality: N/A;  . RADIOLOGY WITH ANESTHESIA Right 07/10/2018   Procedure: Ir With Anesthesia;  Surgeon: Simonne Come, MD;  Location: [facility] OR;  Service: Radiology;  Laterality: Right;  . SCROTAL EXPLORATION N/A 07/15/2018   Procedure: SCROTUM DEBRIDEMENT;  Surgeon: Marcine Matar, MD;  Location: [facility] OR;  Service: Urology;  Laterality: N/A;  . SHOULDER SURGERY    . SKIN SPLIT GRAFT Right 09/19/2018   Procedure: Skin Graft Split Thickness;  Surgeon: Peggye Form, DO;  [facility];  Service: Plastics;  Laterality: Right;  . SKIN SPLIT GRAFT N/A 10/03/2018   Procedure: Split thickness skin graft to gluteal area with acell placement;  Surgeon: Peggye Form, DO;  Location: [facility] OR;  Service:  Plastics;  Laterality: N/A;  3 hours, please  . VACUUM ASSISTED CLOSURE CHANGE N/A 07/12/2018   Procedure: ABDOMINAL VACUUM ASSISTED CLOSURE CHANGE and abdominal washout;  Surgeon: Violeta Gelinas, MD;  Location: [facility] OR;  Service: General;  Laterality: N/A;  . WOUND DEBRIDEMENT Left 07/23/2018   Procedure: DEBRIDEMENT LEFT BUTTOCK  WOUND;  Surgeon: Violeta Gelinas, MD;  Location: [facility] OR;  Service: General;  Laterality: Left;  . WOUND EXPLORATION Left 07/10/2018   Procedure: WOUND EXPLORATION LEFT GROIN;  Surgeon: Berna Bue, MD;  Location: [facility] OR;  Service: General;  Laterality: Left;       Family History  Problem Relation Age of Onset  . Breast cancer Mother        with mets to the bones    Social History   Tobacco Use  . Smoking status: Former Smoker    Packs/day: 1.00    Years: 20.00    Pack years: 20.00    Types: Cigarettes    Quit date: 07/10/2018    Years since quitting: 2.2  . Smokeless tobacco: Never Used  Vaping Use  . Vaping Use: Never used  Substance Use Topics  . Alcohol use: Never  . Drug use: Yes    Types: Oxycodone, Fentanyl    Comment: Fentanyl patch/oxycodone since 06/2018    Home Medications Prior to Admission medications   Medication Sig Start Date End Date Taking? Authorizing Provider  albuterol (VENTOLIN HFA) 108 (90 Base) MCG/ACT inhaler INHALE 2 PUFFS BY MOUTH EVERY 6 HOURS AS NEEDED FOR WHEEZE OR SHORTNESS OF BREATH (WAITING ON PA) Patient taking differently: Inhale 1 puff into the lungs every 6 (six) hours as needed for shortness of breath. 08/31/20  Yes Ardith Dark, MD  Amino Acids-Protein Hydrolys (FEEDING SUPPLEMENT, PRO-STAT 64,) LIQD Take 30 mLs by mouth 3 (three) times daily with meals. 07/02/20  Yes Ardith Dark, MD  Blood Pressure Monitoring (BLOOD PRESSURE CUFF) MISC Use daily as needed to check blood pressure. 11/05/19  Yes Ardith Dark, MD  ciprofloxacin (CIPRO) 500 MG tablet Take 1 tablet (500 mg total) by mouth 2 (two) times  daily. 04/20/20  Yes Veryl Speak, FNP  clonazePAM (KLONOPIN) 0.5 MG tablet Take 0.5 tablets (0.25 mg total) by mouth every 6 (six) hours as needed for anxiety. 09/02/20  Yes Lovorn, Aundra Millet, MD  dextromethorphan-guaiFENesin [facility] DM)  30-600 MG 12hr tablet Take 2 tablets by mouth 2 (two) times daily.   Yes [provider]  dronabinol (MARINOL) 2.5 MG capsule TAKE 1 CAPSULE (2.5 MG TOTAL) BY MOUTH 2 (TWO) TIMES DAILY BEFORE A MEAL. Patient taking differently: Take 2.5 mg by mouth 2 (two) times daily as needed (to encourage appetite). 07/29/19  Yes Lovorn, Aundra Millet, MD  famotidine (PEPCID) 20 MG tablet Take 20 mg by mouth daily.   Yes [provider]  fentaNYL (DURAGESIC) 75 MCG/HR Place 1 patch onto the skin every other day. 09/11/20  Yes Lovorn, Aundra Millet, MD  gabapentin (NEURONTIN) 300 MG capsule Take 1 capsule (300 mg total) by mouth 3 (three) times daily. 05/25/20  Yes Lovorn, Megan, MD  lidocaine (LIDODERM) 5 % PLACE 3 PATCHES ONTO THE SKIN EVERY 12 HOURS. REMOVE & DISCARD PATCH WITHIN 12 HOURS OR AS DIRECTED BY MD Patient taking differently: Place 1 patch onto the skin daily. 06/16/20  Yes Lovorn, Aundra Millet, MD  melatonin ([facility] MELATONIN) 3 MG TABS tablet Take 1 tablet (3 mg total) by mouth at bedtime. 11/01/19  Yes Matcha, Anupama, MD  methocarbamol (ROBAXIN) 500 MG tablet TAKE 1 TABLET BY MOUTH EVERY 6 HOURS AS NEEDED FOR MUSCLE SPASMS Patient taking differently: Take 500 mg by mouth every 6 (six) hours as needed for muscle spasms. 11/06/19  Yes Lovorn, Aundra Millet, MD  mirabegron ER (MYRBETRIQ) 50 MG TB24 tablet Take 50 mg by mouth daily.   Yes [provider]  Multiple Vitamin (MULTIVITAMIN WITH MINERALS) TABS tablet Take 1 tablet by mouth daily. 03/13/19  Yes Buriev, Isaiah Serge, MD  naloxegol oxalate (MOVANTIK) 12.5 MG TABS tablet Take 1 tablet (12.5 mg total) by mouth daily. Patient taking differently: Take 12.5 mg by mouth every evening. 07/14/20  Yes Veryl Speak, FNP  naloxone  [facility]) nasal spray 4 mg/0.1 mL To use if pt develops unconsciousness or confusion that family thinks is related to opioids. Patient taking differently: Place 1 spray into the nose daily as needed ("To [place] if pt develops unconsciousness or confusion that family thinks is related to opiods"). 12/30/19  Yes Lovorn, Aundra Millet, MD  oxybutynin (DITROPAN) 5 MG tablet Take 5 mg by mouth See admin instructions. Take 5 mg by mouth three times a day and an additional 5 mg once daily as needed for urinary urgency 11/13/19  Yes [provider]  oxyCODONE-acetaminophen (PERCOCET) 10-325 MG tablet Take 1 tablet by mouth every 3 (three) hours as needed (breakthrough pain). Patient taking differently: Take 1 tablet by mouth every 3 (three) hours. For pain 08/27/20  Yes Lovorn, Aundra Millet, MD  PARoxetine (PAXIL) 40 MG tablet TAKE 1 TABLET BY MOUTH EVERYDAY AT BEDTIME - PA IN PROGRESS Patient taking differently: Take 40 mg by mouth at bedtime. 03/30/20  Yes Lovorn, Aundra Millet, MD  Probiotic Product (PROBIOTIC COLON SUPPORT) CAPS Take 1 capsule by mouth daily.   Yes [provider]  promethazine (PHENERGAN) 12.5 MG tablet TAKE 1 TABLET (12.5 MG TOTAL) BY MOUTH EVERY 6 (SIX) HOURS AS NEEDED FOR NAUSEA, VOMITING OR REFRACTORY NAUSEA / VOMITING. 10/12/20  Yes Lovorn, Megan, MD  senna-docusate (SENOKOT-S) 8.6-50 MG tablet Take 2 tablets by mouth in the morning, at noon, in the evening, and at bedtime. Takes 2 tablets by mouth in the morning and 2 tablet by mouth at night. 04/13/20  Yes [provider]  traZODone (DESYREL) 100 MG tablet TAKE 1-2 TABLETS (100-200 MG TOTAL) BY MOUTH AT BEDTIME. Patient taking differently: Take 200 mg by mouth at  bedtime. 07/06/20  Yes Lovorn, Aundra Millet, MD  vancomycin (VANCOCIN) 125 MG capsule Take 1 capsule (125 mg total) by mouth 4 (four) times daily. 04/20/20  Yes Veryl Speak, FNP    Allergies    Methadone  Review of Systems   Review of Systems  Constitutional: Negative for  fever.  Respiratory: Positive for cough and shortness of breath.   Cardiovascular: Negative for chest pain and leg swelling.  Gastrointestinal: Positive for abdominal pain, nausea and vomiting. Negative for blood in stool and diarrhea.  Genitourinary: Negative for hematuria.  Musculoskeletal: Negative for back pain.  Neurological: Positive for weakness (generalized). Negative for syncope.  All other systems reviewed and are negative.   Physical Exam Updated Vital Signs BP 130/86 (BP Location: Right Arm)   Pulse (!) 129   Temp 99.6 F (37.6 C) (Oral)   Resp (!) 28   SpO2 94%   Physical Exam Vitals and nursing note reviewed.  Constitutional:      Appearance: He is well-developed. He is ill-appearing. He is not diaphoretic.  HENT:     Head: Normocephalic and atraumatic.     Mouth/Throat:     Mouth: Mucous membranes are moist.     Pharynx: Oropharynx is clear.  Eyes:     Conjunctiva/sclera: Conjunctivae normal.  Cardiovascular:     Rate and Rhythm: Regular rhythm. Tachycardia present.     Pulses: Normal pulses.          Radial pulses are 2+ on the right side and 2+ on the left side.       Posterior tibial pulses are 2+ on the right side and 2+ on the left side.     Heart sounds: Normal heart sounds.     Comments: Tactile temperature in the extremities appropriate and equal bilaterally. Pulmonary:     Effort: Tachypnea present. No respiratory distress.     Breath sounds: Rhonchi present.     Comments: Tachypnea.  Conversational dyspnea. Abdominal:     Palpations: Abdomen is soft.     Tenderness: There is generalized abdominal tenderness. There is no guarding.  Musculoskeletal:     Cervical back: Neck supple.     Right lower leg: No edema.     Left lower leg: No edema.  Skin:    General: Skin is warm and dry.     Comments: Patient has a large sacral decubitus ulcer noted.    The area is tender, however, I did not note swelling, purulence, or erythema.  Neurological:      Mental Status: He is alert.  Psychiatric:        Mood and Affect: Mood and affect normal.        Speech: Speech normal.        Behavior: Behavior normal.     ED Results / Procedures / Treatments   Labs (all labs ordered are listed, but only abnormal results are displayed) Labs Reviewed  COMPREHENSIVE METABOLIC PANEL - Abnormal; Notable for the following components:      Result Value   Glucose, Bld 134 (*)    Creatinine, Ser 0.53 (*)    Albumin 3.0 (*)    Alkaline Phosphatase 143 (*)    All other components within normal limits  CBC WITH DIFFERENTIAL/PLATELET - Abnormal; Notable for the following components:   WBC 11.4 (*)    RBC 4.08 (*)    Hemoglobin 12.2 (*)    Neutro Abs 10.4 (*)    Lymphs Abs 0.5 (*)    All  other components within normal limits  URINALYSIS, ROUTINE W REFLEX MICROSCOPIC - Abnormal; Notable for the following components:   Color, Urine AMBER (*)    APPearance CLOUDY (*)    Hgb urine dipstick MODERATE (*)    Protein, ur 100 (*)    Leukocytes,Ua LARGE (*)    RBC / HPF >50 (*)    WBC, UA >50 (*)    Bacteria, UA RARE (*)    All other components within normal limits  MAGNESIUM - Abnormal; Notable for the following components:   Magnesium 1.6 (*)    All other components within normal limits  I-STAT VENOUS BLOOD GAS, ED - Abnormal; Notable for the following components:   pH, Ven 7.499 (*)    pCO2, Ven 34.8 (*)    pO2, Ven 102.0 (*)    Acid-Base Excess 4.0 (*)    HCT 37.0 (*)    Hemoglobin 12.6 (*)    All other components within normal limits  I-STAT CHEM 8, ED - Abnormal; Notable for the following components:   Creatinine, Ser 0.40 (*)    Glucose, Bld 132 (*)    Hemoglobin 12.6 (*)    HCT 37.0 (*)    All other components within normal limits  RESP PANEL BY RT-PCR (FLU A&B, COVID) ARPGX2  URINE CULTURE  CULTURE, BLOOD (ROUTINE X 2)  CULTURE, BLOOD (ROUTINE X 2)  LACTIC ACID, PLASMA  PROTIME-INR  APTT  BRAIN NATRIURETIC PEPTIDE  LIPASE, BLOOD   LACTIC ACID, PLASMA    EKG EKG Interpretation  Date/Time:  Wednesday Oct 14 2020 08:01:50 EDT Ventricular Rate:  124 PR Interval:  111 QRS Duration: 93 QT Interval:  390 QTC Calculation: 561 R Axis:   52 Text Interpretation: Sinus tachycardia Left atrial enlargement Probable anterior infarct, age indeterminate Abnormal T, consider ischemia, diffuse leads Lateral leads are also involved Prolonged QT interval Confirmed by Norman Clay ([id number]) on 10/14/2020 9:51:02 AM     Radiology CT ABDOMEN PELVIS W CONTRAST  Result Date: 10/14/2020 CLINICAL DATA:  Abdominal pain, vomiting, recent treatment for pneumonia EXAM: CT ABDOMEN AND PELVIS WITH CONTRAST TECHNIQUE: Multidetector CT imaging of the abdomen and pelvis was performed using the standard protocol following bolus administration of intravenous contrast. CONTRAST:  OMNIPAQUE IOHEXOL 350 MG/ML SOLN COMPARISON:  CT chest abdomen pelvis, 09/29/2020 FINDINGS: Lower chest: Heterogeneous airspace opacity and trace pleural effusions in the included bilateral lung bases. Please see separately reported examination of the chest. Hepatobiliary: No solid liver abnormality is seen. No gallstones, gallbladder wall thickening, or biliary dilatation. Pancreas: Unremarkable. No pancreatic ductal dilatation or surrounding inflammatory changes. Spleen: Normal in size without significant abnormality. Adrenals/Urinary Tract: Adrenal glands are unremarkable. Kidneys are normal, without renal calculi, solid lesion, or hydronephrosis. Suprapubic catheter. Stomach/Bowel: Stomach is within normal limits. Appendix appears normal. No evidence of bowel wall thickening, distention, or inflammatory changes. Status post Gertie Gowda procedure with left lower quadrant end colostomy and rectal stump. Vascular/Lymphatic: Aortic atherosclerosis. No enlarged abdominal or pelvic lymph nodes. Reproductive: No mass or other significant abnormality. Other: No abdominal wall hernia or  abnormality. No abdominopelvic ascites. Musculoskeletal: Redemonstrated chronic fracture deformities of the pelvis, status post screw fixation of the left femoral neck and screw fixation of the left acetabulum. Deep decubitus ulceration overlying the posterior left pelvis and sacrum. IMPRESSION: 1. No acute CT findings of the abdomen or pelvis to explain abdominal pain, nausea or vomiting. 2. Status post Hartmann procedure with left lower quadrant end colostomy and rectal stump. 3. Deep decubitus ulceration  overlying the posterior left pelvis and sacrum. 4. Redemonstrated chronic fracture deformities of the pelvis. 5. Please see separately reported examination of the chest. Aortic Atherosclerosis (ICD10-I70.0). Electronically Signed   By: Lauralyn Primes M.D.   On: 10/14/2020 11:10   DG Chest Port 1 View  Result Date: 10/14/2020 CLINICAL DATA:  Questionable sepsis - evaluate for abnormality EXAM: PORTABLE CHEST 1 VIEW COMPARISON:  CT 09/29/2020, radiograph 09/29/2020 FINDINGS: Unchanged cardiomediastinal silhouette. There is right upper and lower lung airspace disease, increased in the right upper lung compared to prior exam. Left lower lung linear scarring. There is with small left pleural effusion. No visible pneumothorax. No acute osseous abnormality. IMPRESSION: Persistent multifocal airspace disease, increased in the right upper lung in comparison to the prior exam. Small left pleural effusion with adjacent basilar atelectasis. Electronically Signed   By: Caprice Renshaw   On: 10/14/2020 09:24   CLINICAL DATA: PE suspected, shortness of breath, recent antibiotic therapy for pneumonia  EXAM: CT ANGIOGRAPHY CHEST WITH CONTRAST  TECHNIQUE: Multidetector CT imaging of the chest was performed using the standard protocol during bolus administration of intravenous contrast. Multiplanar CT image reconstructions and MIPs were obtained to evaluate the vascular anatomy.  CONTRAST: OMNIPAQUE IOHEXOL 350  MG/ML SOLN  COMPARISON: 09/29/2020  FINDINGS: Cardiovascular: Satisfactory opacification of the pulmonary arteries to the segmental level. No evidence of pulmonary embolism. Normal heart size. No pericardial effusion.  Mediastinum/Nodes: No enlarged mediastinal, hilar, or axillary lymph nodes. Thyroid gland, trachea, and esophagus demonstrate no significant findings.  Lungs/Pleura: There is redemonstrated extensive heterogeneous and ground-glass airspace opacity throughout the right lung, which is generally improved compared to prior examination although somewhat fluctuant, with increased areas of airspace opacity in the right upper lobe (series 5, image 33). There is chronic appearing scarring and or atelectasis of the dependent left lung base. There are left-sided pleural calcifications and plaques. No pleural effusion or pneumothorax.  Upper Abdomen: No acute abnormality.  Musculoskeletal: No chest wall abnormality. No acute or significant osseous findings.  Review of the MIP images confirms the above findings.  IMPRESSION: 1. Negative examination for pulmonary embolism. 2. There is redemonstrated extensive heterogeneous and ground-glass airspace opacity throughout the right lung, which is generally improved compared to prior examination although somewhat fluctuant, with increased areas of airspace opacity in the right upper lobe. Findings are consistent with ongoing multifocal infection. 3. Chronic appearing scarring and or atelectasis of the dependent left lung base.   Electronically Signed By: Lauralyn Primes M.D. On: 10/14/2020 11:21  Procedures Procedures   Medications Ordered in ED Medications  ceFEPIme (MAXIPIME) 2 g in sodium chloride 0.9 % 100 mL IVPB (0 g Intravenous Stopped 10/14/20 1128)  doxycycline (VIBRAMYCIN) 100 mg in sodium chloride 0.9 % 250 mL IVPB (0 mg Intravenous Stopped 10/14/20 1345)  vancomycin (VANCOREADY) IVPB 1250 mg/250 mL (has no  administration in time range)  lactated ringers infusion (has no administration in time range)  lactated ringers bolus 1,750 mL (1,750 mLs Intravenous New Bag/Given 10/14/20 1622)  enoxaparin (LOVENOX) injection 40 mg (has no administration in time range)  sodium chloride flush (NS) 0.9 % injection 3 mL (has no administration in time range)  albuterol (PROVENTIL) (2.5 MG/3ML) 0.083% nebulizer solution 2.5 mg (has no administration in time range)  oxyCODONE-acetaminophen (PERCOCET) 10-325 MG per tablet 1 tablet (has no administration in time range)  lactated ringers bolus 500 mL (0 mLs Intravenous Stopped 10/14/20 1018)  LORazepam (ATIVAN) injection 1 mg (1 mg Intravenous Given 10/14/20 0908)  vancomycin (VANCOREADY) IVPB 1500 mg/300 mL (0 mg Intravenous Stopped 10/14/20 1607)  iohexol (OMNIPAQUE) 350 MG/ML injection 100 mL (100 mLs Intravenous Contrast Given 10/14/20 1039)    ED Course  I have reviewed the triage vital signs and the nursing notes.  Pertinent labs & imaging results that were available during my care of the patient were reviewed by me and considered in my medical decision making (see chart for details).  Clinical Course as of 10/14/20 1646  Wed Oct 14, 2020  0909 Discussing with Sherron Monday, pharmacist regarding ABX choice. [SJ]  1450 BP: 91/81 Patient is lying on his side during BP reading. [SJ]  1453 Patient initially showed improvement in his breathing with decrease in tachycardia, however, upon reevaluation he was again noted to be tachypneic, tachycardic, and hypoxic down to 88% on his home O2 level of 3 L. Patient is able to lie reclined without orthopnea.  He does still have rhonchi throughout all fields of the lungs. [SJ]  1522 Spoke with Dr. [name], hospitalist.  Agrees to admit the patient. [SJ]    Clinical Course User Index [SJ] Marilyn Wing, Hillard Danker, PA-C   MDM Rules/Calculators/A&P                          Patient presents with vomiting as well as recurrence of coughing  and shortness of breath all beginning last night. Patient is ill-appearing.  Tachypneic, increased work of breathing, and tachycardia upon initial evaluation.  His SPO2 was initially maintaining around 91 to 92% on home supplemental O2 of 3 L.  Not hypotensive. Tachycardia initially improved, however, then recurred.  Patient again became conversationally dyspneic with SPO2 dropping to 87 to 88% on 3 L.  Requiring 5 L to maintain adequate SPO2. Code sepsis activated.  No lactic acid elevation. CT of the chest with persistent multifocal pneumonia, despite at least 2 weeks of Augmentin, in addition to the patient's Cipro.  Patient will need to be admitted for IV antibiotics.  Additionally, he has a new oxygen requirement.   Findings and plan of care discussed with attending physician, Sherin Quarry, MD.   Vitals:   10/14/20 1330 10/14/20 1345 10/14/20 1400 10/14/20 1600  BP: 101/80  91/81 97/64  Pulse: 81 (!) 104 94 92  Resp: 17 (!) 21 (!) 23 (!) 22  Temp:      TempSrc:      SpO2: 92% (!) 89% 90% 92%  Weight:          Final Clinical Impression(s) / ED Diagnoses Final diagnoses:  Cough  Hypoxia  Multifocal pneumonia    Rx / DC Orders ED Discharge Orders    None       Concepcion Living 10/14/20 1650    Cheryll Cockayne, MD 10/17/20 2228

## 2020-10-14 NOTE — Progress Notes (Signed)
Pharmacy Antibiotic Note  Christopher [name] is a 55 y.o. male admitted on 10/14/2020 with pneumonia.  Pharmacy has been consulted for cefepime and vancomycin dosing.  WBC 11.4, LA 1.6, afebrile. Scr 0.4 (CrCl 100 mL/min). On chronic cipro - was recently treated for PNA on cefepime/vancomycin/azithromycin in ED and discharge on augmentin in April.   Plan: Cefepime 2g IV every 8 hours Vancomycin 1500 mg once then 1250 mg every 12 hours (estAUC 438) Monitor renal fx, cx results, clinica pic, and levels as appropriate  Weight: 72.6 kg (160 lb)  Temp (24hrs), Avg:99.6 F (37.6 C), Min:99.6 F (37.6 C), Max:99.6 F (37.6 C)  Recent Labs  Lab 10/14/20 0830 10/14/20 0856  WBC 11.4*  --   CREATININE 0.53* 0.40*    Estimated Creatinine Clearance: 108.4 mL/min (A) (by C-G formula based on SCr of 0.4 mg/dL (L)).    Allergies  Allergen Reactions  . Methadone Other (See Comments)    Hallucinations/confusion    Antimicrobials this admission: Vancomycin 5/4 >>  Cefepime 5/4 >>  Doxycycline 5/4>>  Dose adjustments this admission: N/A  Microbiology results: 5/4 BCx: sent  5/4 UCx: sent  5/4 COVID PCR: neg  Thank you for allowing pharmacy to be a part of this patient's care.  Sherron Monday, PharmD, BCCCP Clinical Pharmacist  Phone: [phone] 10/14/2020 1:41 PM  Please check AMION for all [facility] Pharmacy phone numbers After 10:00 PM, call Main Pharmacy [phone]
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
## 2020-10-14 NOTE — ED Notes (Signed)
RN attempted to call report to 2W x3

## 2020-10-14 NOTE — Sepsis Progress Note (Signed)
elink is following this code sepsis 

## 2020-10-14 NOTE — ED Notes (Signed)
RT called for humidified 02

## 2020-10-14 NOTE — ED Notes (Signed)
Patient states unable to have rectal temp d/t packing in rectum. MD notified.

## 2020-10-14 NOTE — ED Notes (Signed)
RN attempted to call report to 2W x1 

## 2020-10-15 ENCOUNTER — Inpatient Hospital Stay (HOSPITAL_COMMUNITY): Payer: No Typology Code available for payment source

## 2020-10-15 LAB — URINE CULTURE: Culture: >=100000 — AB

## 2020-10-15 LAB — COMPREHENSIVE METABOLIC PANEL
ALT: 20 U/L (ref 0–44)
AST: 16 U/L (ref 15–41)
Albumin: 2.4 g/dL — ABNORMAL LOW (ref 3.5–5.0)
Alkaline Phosphatase: 104 U/L (ref 38–126)
Anion gap: 5 (ref 5–15)
BUN: 10 mg/dL (ref 6–20)
CO2: 24 mmol/L (ref 22–32)
Calcium: 8.8 mg/dL — ABNORMAL LOW (ref 8.9–10.3)
Chloride: 106 mmol/L (ref 98–111)
Creatinine, Ser: 0.49 mg/dL — ABNORMAL LOW (ref 0.61–1.24)
GFR, Estimated: >60 mL/min (ref >60)
Glucose, Bld: 83 mg/dL (ref 70–99)
Potassium: 3.5 mmol/L (ref 3.5–5.1)
Sodium: 135 mmol/L (ref 135–145)
Total Bilirubin: 0.9 mg/dL (ref 0.3–1.2)
Total Protein: 5.6 g/dL — ABNORMAL LOW (ref 6.5–8.1)

## 2020-10-15 LAB — CBC
HCT: 31.5 % — ABNORMAL LOW (ref 39.0–52.0)
Hemoglobin: 9.8 g/dL — ABNORMAL LOW (ref 13.0–17.0)
MCH: 30.3 pg (ref 26.0–34.0)
MCHC: 31.1 g/dL (ref 30.0–36.0)
MCV: 97.5 fL (ref 80.0–100.0)
Platelets: 180 10*3/uL (ref 150–400)
RBC: 3.23 MIL/uL — ABNORMAL LOW (ref 4.22–5.81)
RDW: 12.3 % (ref 11.5–15.5)
WBC: 5.5 10*3/uL (ref 4.0–10.5)
nRBC: 0 % (ref 0.0–0.2)

## 2020-10-15 LAB — MRSA PCR SCREENING: MRSA by PCR: NEGATIVE

## 2020-10-15 MED ORDER — CLONAZEPAM 0.5 MG PO TBDP
0.5000 mg | ORAL_TABLET | Freq: Four times a day (QID) | ORAL | Status: DC | PRN
  Administered 2020-10-15 – 2020-10-18 (×6): 0.5 mg via ORAL
  Filled 2020-10-15 (×6): qty 1

## 2020-10-15 MED ORDER — CHLORHEXIDINE GLUCONATE CLOTH 2 % EX PADS
6.0000 | MEDICATED_PAD | Freq: Every day | CUTANEOUS | Status: DC
  Administered 2020-10-16 – 2020-10-18 (×3): 6 via TOPICAL
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
## 2020-10-15 NOTE — Progress Notes (Signed)
Pt arrived to 2w11 from ED. Report received from [facility]. Pt has IV x 1, suprapubic cath, colostomy, 4L [place]. Pt A&O x 4, oriented to unit and room. Call bell given to pt. Provider notified of pt's arrival.
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
## 2020-10-15 NOTE — Progress Notes (Signed)
SLP Cancellation Note  Patient Details Name: Christopher [name] MRN: [id number] DOB: 1965-10-27   Cancelled treatment:       Reason Eval/Treat Not Completed: Patient declined, no reason specified (SLP was contacted by radiology tech and advised that the pt would be unable to complete the study due to his constant emesis and coughing upon arrival upon arrival to the radiology suite. SLP will follow up at bedside and plan to reattempt MBS on 5/6 pending pt's status.)  [name] I. Vear Clock, MS, CCC-SLP Acute Rehabilitation Services Office number [phone] Pager [phone]  Scheryl Marten 10/15/2020, 3:26 PM
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
## 2020-10-15 NOTE — Plan of Care (Signed)
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
## 2020-10-16 ENCOUNTER — Inpatient Hospital Stay (HOSPITAL_COMMUNITY): Payer: No Typology Code available for payment source

## 2020-10-16 LAB — TROPONIN I (HIGH SENSITIVITY)
Troponin I (High Sensitivity): 5 ng/L (ref <18)
Troponin I (High Sensitivity): 4 ng/L (ref <18)

## 2020-10-16 MED ORDER — PANTOPRAZOLE SODIUM 40 MG IV SOLR
40.0000 mg | Freq: Once | INTRAVENOUS | Status: AC
  Administered 2020-10-16: 40 mg via INTRAVENOUS
  Filled 2020-10-16: qty 40

## 2020-10-16 MED ORDER — KETOROLAC TROMETHAMINE 30 MG/ML IJ SOLN
30.0000 mg | Freq: Four times a day (QID) | INTRAMUSCULAR | Status: DC | PRN
  Administered 2020-10-16: 30 mg via INTRAVENOUS
  Filled 2020-10-16: qty 1

## 2020-10-16 MED ORDER — ALUM & MAG HYDROXIDE-SIMETH 200-200-20 MG/5ML PO SUSP
30.0000 mL | Freq: Once | ORAL | Status: AC
  Administered 2020-10-16: 30 mL via ORAL
  Filled 2020-10-16: qty 30

## 2020-10-16 MED ORDER — DOXYCYCLINE HYCLATE 100 MG PO TABS
100.0000 mg | ORAL_TABLET | Freq: Two times a day (BID) | ORAL | Status: DC
  Administered 2020-10-16 – 2020-10-18 (×5): 100 mg via ORAL
  Filled 2020-10-16 (×5): qty 1

## 2020-10-16 MED ORDER — LIDOCAINE VISCOUS HCL 2 % MT SOLN
15.0000 mL | Freq: Once | OROMUCOSAL | Status: AC
  Administered 2020-10-16: 15 mL via ORAL
  Filled 2020-10-16 (×2): qty 15
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
## 2020-10-16 NOTE — Progress Notes (Signed)
Modified Barium Swallow Progress Note  Patient Details  Name: Christopher [name] MRN: [id number] Date of Birth: [birth year]  Today's Date: 10/16/2020  Modified Barium Swallow completed.  Full report located under Chart Review in the Imaging Section.  Brief recommendations include the following:  Clinical Impression  Pt presented with pharyngeal dysphagia characterized by a pharyngeal delay and mildly reduced anterior laryngeal movement. These facilitated penetration (PAS 3) of thin liquids via cup and straw.  Penetration was eliminated with use of a chin tuck posture and no instances of aspiration were demonstrated. Mild vallecular and pyriform sinus residue improved with pt's independent use of secondary swallows. The barium tablet became temporarily lodged in the valleculae, but transport was facilited with pt's use of a second swallow. Esophageal screening revealed retention of barium in the mid to lower thoracic esophagus which did not clear with time. An outpatient esophagram was scheduled for 5/4, but pt has missed this appointment due to his admission to the hospital. Considering these factors and pt's reports of globus sensation in the mid chest area, SLP recommends esophageal assessment and/or GI involvement. A regular texture diet with thin liquids via cup is recommended at this time with observance of swallowing precuations. SLP will continue to follow pt.   Swallow Evaluation Recommendations       SLP Diet Recommendations: Regular solids;Thin liquid   Liquid Administration via: Cup;No straw   Medication Administration: Whole meds with puree   Supervision: Patient able to self feed   Compensations: Slow rate;Small sips/bites;Chin tuck   Postural Changes: Seated upright at 90 degrees   Oral Care Recommendations: Oral care BID      Luisa Louk I. Vear Clock, MS, CCC-SLP Acute Rehabilitation Services Office number [phone] Pager [phone]   Scheryl Marten 10/16/2020,3:54 PM

## 2020-10-16 NOTE — Progress Notes (Signed)
Attempted to change patient dressing x3 today; pt refused; at this time patient states after pain medication takes effect agreeable to dressing change. Will attempt in an hour

## 2020-10-17 DIAGNOSIS — R8290 Unspecified abnormal findings in urine: Secondary | ICD-10-CM

## 2020-10-17 DIAGNOSIS — M86652 Other chronic osteomyelitis, left thigh: Secondary | ICD-10-CM

## 2020-10-17 DIAGNOSIS — J9621 Acute and chronic respiratory failure with hypoxia: Secondary | ICD-10-CM

## 2020-10-17 DIAGNOSIS — J189 Pneumonia, unspecified organism: Secondary | ICD-10-CM

## 2020-10-17 NOTE — Progress Notes (Signed)
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 laryngeal elevation;Pharyngeal residue - valleculae;Pharyngeal residue - pyriform Pharyngeal Material enters airway, remains ABOVE vocal cords and not ejected out Pharyngeal- Puree Delayed swallow initiation-vallecula;Reduced anterior laryngeal mobility;Pharyngeal residue - valleculae;Pharyngeal residue - pyriform Pharyngeal -- Pharyngeal- Mechanical Soft -- Pharyngeal -- Pharyngeal- Regular Delayed swallow initiation-vallecula;Reduced anterior laryngeal mobility;Pharyngeal residue - valleculae;Pharyngeal residue - pyriform Pharyngeal -- Pharyngeal- Multi-consistency -- Pharyngeal -- Pharyngeal- Pill Delayed swallow initiation-vallecula;Reduced anterior laryngeal mobility;Pharyngeal residue - valleculae;Pharyngeal residue - pyriform Pharyngeal -- Pharyngeal Comment --  [facility] IP CERVICAL ESOPHAGEAL PHASE 10/16/2020 Cervical Esophageal Phase Impaired Pudding  Teaspoon -- Pudding Cup -- Honey Teaspoon -- Honey Cup --  Nectar Teaspoon -- Nectar Cup -- Nectar Straw -- Thin Teaspoon -- Thin Cup -- Thin Straw -- Puree -- Mechanical Soft -- Regular -- Multi-consistency -- Pill -- Cervical Esophageal Comment -- Shanika I. Vear Clock, MS, CCC-SLP [facility] Office number [phone] Pager [phone] Scheryl Marten 10/16/2020, 3:57 PM               Pamella Pert, MD, PhD [facility]  Between 7 am - 7 pm I am available, please contact me via Amion (for emergencies) or Securechat (non urgent messages)  Between 7 pm - 7 am I am not available, please contact night coverage MD/APP via Amion

## 2020-10-18 LAB — BASIC METABOLIC PANEL
Anion gap: 10 (ref 5–15)
BUN: 5 mg/dL — ABNORMAL LOW (ref 6–20)
CO2: 26 mmol/L (ref 22–32)
Calcium: 8.9 mg/dL (ref 8.9–10.3)
Chloride: 104 mmol/L (ref 98–111)
Creatinine, Ser: 0.46 mg/dL — ABNORMAL LOW (ref 0.61–1.24)
GFR, Estimated: >60 mL/min (ref >60)
Glucose, Bld: 78 mg/dL (ref 70–99)
Potassium: 2.8 mmol/L — ABNORMAL LOW (ref 3.5–5.1)
Sodium: 140 mmol/L (ref 135–145)

## 2020-10-18 LAB — CBC
HCT: 30.6 % — ABNORMAL LOW (ref 39.0–52.0)
Hemoglobin: 9.3 g/dL — ABNORMAL LOW (ref 13.0–17.0)
MCH: 29.7 pg (ref 26.0–34.0)
MCHC: 30.4 g/dL (ref 30.0–36.0)
MCV: 97.8 fL (ref 80.0–100.0)
Platelets: 211 10*3/uL (ref 150–400)
RBC: 3.13 MIL/uL — ABNORMAL LOW (ref 4.22–5.81)
RDW: 12.1 % (ref 11.5–15.5)
WBC: 4.3 10*3/uL (ref 4.0–10.5)
nRBC: 0 % (ref 0.0–0.2)

## 2020-10-18 MED ORDER — AMOXICILLIN-POT CLAVULANATE 875-125 MG PO TABS: 1.0000 | ORAL_TABLET | Freq: Two times a day (BID) | ORAL | 0 refills | Status: AC

## 2020-10-18 MED ORDER — POTASSIUM CHLORIDE CRYS ER 20 MEQ PO TBCR
40.0000 meq | EXTENDED_RELEASE_TABLET | ORAL | Status: DC
  Administered 2020-10-18: 40 meq via ORAL
  Filled 2020-10-18: qty 2

## 2020-10-18 NOTE — Progress Notes (Signed)
Pt refused wound care to left hip/back region. He would like dressing to be changed later in am. Will let oncoming RN know.
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
## 2020-10-19 ENCOUNTER — Telehealth: Payer: Self-pay | Admitting: *Deleted

## 2020-10-19 ENCOUNTER — Telehealth: Payer: Self-pay

## 2020-10-19 LAB — CULTURE, BLOOD (ROUTINE X 2)
Culture: NO GROWTH
Culture: NO GROWTH

## 2020-10-19 NOTE — Telephone Encounter (Signed)
Home Health Verbal Orders  Agency:  [facility]   Requesting skilled nursing:    Comments: Patient was recently discharged and they were asked to go out to assist with nursing but never received an actual order. They are requesting a verbal order to go back out to see patient.

## 2020-10-19 NOTE — Telephone Encounter (Signed)
[name]. Christopher [name] was discharged on 10/14/2020.(Dx Cough, Hypoxia, & pneumonia). Mervin Kung RN  with [facility] wanted to know if you will give a verbal order, for in-home skilled nursing?   She has been advised to call his PCP for new orders,  for recent hospitalization.   Please advise.  Call back phone [phone].

## 2020-10-19 NOTE — Telephone Encounter (Signed)
Ok with me. Please place any necessary orders. 

## 2020-10-19 NOTE — Telephone Encounter (Signed)
Please advise 

## 2020-10-19 NOTE — Telephone Encounter (Cosign Needed)
Transition Care Management Follow-up Telephone Call  Date of discharge and from where: [facility] 10/18/20  How have you been since you were released from the hospital? 60% better  Any questions or concerns? No  Items Reviewed:  Did the pt receive and understand the discharge instructions provided? Yes   Medications obtained and verified? Yes   Other? No   Any new allergies since your discharge? No   Dietary orders reviewed? No  Do you have support at home? Yes   Home Care and Equipment/Supplies: Were home health services ordered? yes If so, what is the name of the agency? [facility]   Has the agency set up a time to come to the patient's home? yes Were any new equipment or medical supplies ordered?  Yes: pt stated a new mattress  What is the name of the medical supply agency? [facility] Were you able to get the supplies/equipment? not applicable Do you have any questions related to the use of the equipment or supplies? No  Functional Questionnaire: (I = Independent and D = Dependent) ADLs: I  Bathing/Dressing- I  Meal Prep- I  Eating- I  Maintaining continence- I  Transferring/Ambulation- I  Managing Meds- I  Follow up appointments reviewed:   PCP Hospital f/u appt confirmed? No  Pt ststaed his wife will call this week for appt   Specialist Hospital f/u appt confirmed? No    Are transportation arrangements needed? No   If their condition worsens, is the pt aware to call PCP or go to the Emergency Dept.? Yes  Was the patient provided with contact information for the PCP's office or ED? Yes  Was to pt encouraged to call back with questions or concerns? Yes

## 2020-10-19 NOTE — Telephone Encounter (Signed)
Mrs Neal called for refill on Mr Minervini's oxycodone and Fentanyl.  Last filled per pmp Fentanyl 09/17/20, oxycodone 10/325 08/31/20. Next appt is 11/04/20.

## 2020-10-19 NOTE — Telephone Encounter (Signed)
VO given to English as a second language teacher from [facility]

## 2020-10-20 MED ORDER — OXYCODONE-ACETAMINOPHEN 10-325 MG PO TABS: 1.0000 | ORAL_TABLET | ORAL | 0 refills | Status: DC | PRN

## 2020-10-20 MED ORDER — FENTANYL 75 MCG/HR TD PT72: 1.0000 | MEDICATED_PATCH | TRANSDERMAL | 0 refills | Status: DC

## 2020-10-20 NOTE — Telephone Encounter (Signed)
Please give approval- unless there's a problem medically, I always renew therapy orders- thanks, ML

## 2020-10-20 NOTE — Telephone Encounter (Signed)
Renewed Percocet as well as Duragesic patches- next Appointment 11/04/20

## 2020-10-20 NOTE — Telephone Encounter (Signed)
Dr. [name] signed off on the skilled nursing orders. Per Mervin Kung RN no therapy. Thank you.

## 2020-10-27 ENCOUNTER — Other Ambulatory Visit: Payer: Self-pay

## 2020-10-27 ENCOUNTER — Encounter: Payer: Self-pay | Admitting: Family

## 2020-10-27 ENCOUNTER — Ambulatory Visit (INDEPENDENT_AMBULATORY_CARE_PROVIDER_SITE_OTHER): Payer: PRIVATE HEALTH INSURANCE | Admitting: Family

## 2020-10-27 VITALS — BP 122/80 | HR 72 | Temp 97.7°F

## 2020-10-27 DIAGNOSIS — Z23 Encounter for immunization: Secondary | ICD-10-CM | POA: Diagnosis not present

## 2020-10-27 DIAGNOSIS — G8921 Chronic pain due to trauma: Secondary | ICD-10-CM | POA: Diagnosis not present

## 2020-10-27 DIAGNOSIS — M86652 Other chronic osteomyelitis, left thigh: Secondary | ICD-10-CM | POA: Diagnosis not present

## 2020-10-27 DIAGNOSIS — E43 Unspecified severe protein-calorie malnutrition: Secondary | ICD-10-CM

## 2020-10-27 DIAGNOSIS — J9691 Respiratory failure, unspecified with hypoxia: Secondary | ICD-10-CM

## 2020-10-27 NOTE — Assessment & Plan Note (Signed)
Currently on 3 L of oxygen via nasal cannula. Has had a couple episodes of pneumonia with concern for possible aspiration origin. Encouraged techniques to decrease aspiration. Has appointment with pulmonology. Prevnar updated today.

## 2020-10-27 NOTE — Assessment & Plan Note (Signed)
Mr. Gunderman continues to have abdominal pain of unclear origin and question if this is related to chronic pain.  Currently treated by physical medicine and rehabilitation with oxycodone.  Most recent CT scan from a couple of weeks ago with no significant findings to explain abdominal pain.  Concern for his decreased oral intake if unable to take an adequate protein his wounds may worsen and increase risk for further infection and complicate healing. Recommend follow up with primary care and GI to optimize his nutritional status.

## 2020-10-27 NOTE — Assessment & Plan Note (Addendum)
Christopher [name] continues to remain stable with current dose of ciprofloxacin and oral vancomycin for C. difficile prevention.  Currently suppressed for multidrug-resistant Pseudomonas.  Wishes to continue with suppression versus surgery at this time.  Continue current dose of ciprofloxacin. Will plan to follow up in 3 months or sooner if needed.

## 2020-10-27 NOTE — Patient Instructions (Addendum)
Nice to see you.  Continue to take Ciprofloxacin and oral vancomycin.  Given Prevnar vaccination today.  Continue with aspiration pre-cautions and optimizing nutrition as able.  Refills are available at the pharmacy.  Plan for follow up in 3 months or sooner if needed.

## 2020-10-27 NOTE — Progress Notes (Signed)
Subjective:    Patient ID: Christopher [name], male    DOB: 07-16-1965, 55 y.o.   MRN: [id number]  Chief Complaint  Patient presents with  . Follow-up     HPI:  Christopher [name] is a 55 y.o. 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  Last seen on 07/14/2020 having been seen at [facility] and holding off on surgery due to the risk outweighing the benefit.  Here today for routine follow-up.  Christopher [name] continues to take his ciprofloxacin and vancomycin as prescribed with no adverse side effects.  Was recently hospitalized with concern for aspiration pneumonia.  Completed 7-day course of Augmentin.  Concern for decreased oral intake over the last several weeks secondary to stomach pain with eating described as dull.  No fevers/chills.  Continues to take Movantik.  Recently seen by plastic surgery with recommendation to continue with wound packing and antibiotics.   Allergies  Allergen Reactions  . Methadone Other (See Comments)    Hallucinations/confusion      Outpatient Medications Prior to Visit  Medication Sig Dispense Refill  . albuterol (VENTOLIN HFA) 108 (90 Base) MCG/ACT inhaler INHALE 2 PUFFS BY MOUTH EVERY 6 HOURS AS NEEDED FOR WHEEZE OR SHORTNESS OF BREATH (WAITING ON PA) (Patient taking differently: Inhale 1 puff into the lungs every 6 (six) hours as needed for shortness of breath.) 18 each 1  . Amino Acids-Protein Hydrolys (FEEDING SUPPLEMENT, PRO-STAT 64,) LIQD Take 30 mLs by mouth 3 (three) times daily with meals. 887 mL 0  . Blood Pressure Monitoring (BLOOD PRESSURE CUFF) MISC Use daily as needed to check blood pressure. 1 each 0  . ciprofloxacin (CIPRO) 500 MG tablet Take 1 tablet (500 mg total) by mouth 2 (two) times daily. 60  tablet 11  . clonazePAM (KLONOPIN) 0.5 MG tablet Take 0.5 tablets (0.25 mg total) by mouth every 6 (six) hours as needed for anxiety. 60 tablet 5  . dextromethorphan-guaiFENesin (MUCINEX DM) 30-600 MG 12hr tablet Take 2 tablets by mouth 2 (two) times daily.    Marland Kitchen dronabinol (MARINOL) 2.5 MG capsule TAKE 1 CAPSULE (2.5 MG TOTAL) BY MOUTH 2 (TWO) TIMES DAILY BEFORE A MEAL. (Patient taking differently: Take 2.5 mg by mouth 2 (two) times daily as needed (to encourage appetite).) 60 capsule 5  . famotidine (PEPCID) 20 MG tablet Take 20 mg by mouth daily.    . fentaNYL (DURAGESIC) 75 MCG/HR Place 1 patch onto the skin every other day. 15 patch 0  . gabapentin (NEURONTIN) 300 MG capsule Take 1 capsule (300 mg total) by mouth 3 (three) times daily. 270 capsule 3  . lidocaine (LIDODERM) 5 % PLACE 3 PATCHES ONTO THE SKIN EVERY 12 HOURS. REMOVE & DISCARD PATCH WITHIN 12 HOURS OR AS DIRECTED BY MD (Patient taking differently: Place 1 patch onto the skin daily.) 90 patch 5  . melatonin ([employer] MELATONIN) 3 MG TABS tablet Take 1 tablet (3 mg total) by mouth at bedtime. 10 tablet 0  . methocarbamol (ROBAXIN) 500 MG tablet TAKE 1 TABLET BY MOUTH EVERY 6 HOURS AS NEEDED FOR MUSCLE SPASMS (Patient taking differently: Take 500 mg by mouth every 6 (six) hours as needed for muscle spasms.) 100 tablet 11  . mirabegron ER (MYRBETRIQ) 50 MG TB24 tablet Take 50 mg by mouth daily.    . Multiple Vitamin (MULTIVITAMIN  WITH MINERALS) TABS tablet Take 1 tablet by mouth daily.    . naloxegol oxalate (MOVANTIK) 12.5 MG TABS tablet Take 1 tablet (12.5 mg total) by mouth daily. (Patient taking differently: Take 12.5 mg by mouth every evening.) 30 tablet 11  . naloxone (NARCAN) nasal spray 4 mg/0.1 mL To use if pt develops unconsciousness or confusion that family thinks is related to opioids. (Patient taking differently: Place 1 spray into the nose daily as needed ("To us if pt develops unconsciousness or confusion that family thinks is  related to opiods").) 1 each 3  . oxybutynin (DITROPAN) 5 MG tablet Take 5 mg by mouth See admin instructions. Take 5 mg by mouth three times a day and an additional 5 mg once daily as needed for urinary urgency    . oxyCODONE-acetaminophen (PERCOCET) 10-325 MG tablet Take 1 tablet by mouth every 3 (three) hours as needed (breakthrough pain). 180 tablet 0  . PARoxetine (PAXIL) 40 MG tablet TAKE 1 TABLET BY MOUTH EVERYDAY AT BEDTIME - PA IN PROGRESS (Patient taking differently: Take 40 mg by mouth at bedtime.) 90 tablet 2  . Probiotic Product (PROBIOTIC COLON SUPPORT) CAPS Take 1 capsule by mouth daily.    . promethazine (PHENERGAN) 12.5 MG tablet TAKE 1 TABLET (12.5 MG TOTAL) BY MOUTH EVERY 6 (SIX) HOURS AS NEEDED FOR NAUSEA, VOMITING OR REFRACTORY NAUSEA / VOMITING. 90 tablet 5  . senna-docusate (SENOKOT-S) 8.6-50 MG tablet Take 2 tablets by mouth in the morning, at noon, in the evening, and at bedtime. Takes 2 tablets by mouth in the morning and 2 tablet by mouth at night.    . traZODone (DESYREL) 100 MG tablet TAKE 1-2 TABLETS (100-200 MG TOTAL) BY MOUTH AT BEDTIME. (Patient taking differently: Take 200 mg by mouth at bedtime.) 180 tablet 2  . vancomycin (VANCOCIN) 125 MG capsule Take 1 capsule (125 mg total) by mouth 4 (four) times daily. 120 capsule 11   No facility-administered medications prior to visit.     Past Medical History:  Diagnosis Date  . Acute on chronic respiratory failure with hypoxia (HCC) 06/2018   trach removed 11-16-2018, on vent from jan until may 2020 - uses albuterol prn  . Anxiety   . Bacteremia due to Pseudomonas 06/2018  . Chronic osteomyelitis (HCC)   . Chronic pain syndrome   . Clostridium difficile colitis 10/30/2019   tx with abx   . Depression   . DVT (deep venous thrombosis) (HCC) 2020   right brachial post PICC line  . History of blood transfusion 06/2018  . History of Clostridioides difficile colitis   . History of kidney stones   . Hypertension     norvasc d/c by pcp on 11/05/19  . Multiple traumatic injuries   . Penile pain 11/18/2019  . Pneumonia 11/2009   2020 x 2  . Dan HumphreysWalker as ambulation aid   . Wheelchair bound    electric  . Wound discharge    left hip wound with bloody/clear drainage change dressing q day surgilube with gauze, between legs wound using calcium algenate pad bid     Past Surgical History:  Procedure Laterality Date  . APPLICATION OF A-CELL OF BACK N/A 08/06/2018   Procedure: Application Of A-Cell Of Back;  Surgeon: Peggye Formillingham, Claire S, DO;  Location: [facility] OR;  Service: Plastics;  Laterality: N/A;  . APPLICATION OF A-CELL OF EXTREMITY Left 08/06/2018   Procedure: Application Of A-Cell Of Extremity;  Surgeon: Peggye Formillingham, Claire S, DO;  Location: [facility] OR;  Service: Government social research officer;  Laterality: Left;  . APPLICATION OF A-CELL OF EXTREMITY Left 09/18/2019   Procedure: APPLICATION OF A-CELL OF EXTREMITY;  Surgeon: Peggye Form, DO;  Location: [facility] OR;  Service: Plastics;  Laterality: Left;  . APPLICATION OF WOUND VAC  07/12/2018   Procedure: Application Of Wound Vac to the Left Thigh and Scrotum.;  Surgeon: Roby Lofts, MD;  Location: [facility] OR;  Service: Orthopedics;;  . APPLICATION OF WOUND VAC  07/10/2018   Procedure: Application Of Wound Vac;  Surgeon: Berna Bue, MD;  [facility]: [facility];  Service: General;;  . COLON SURGERY  2020   colostomy  . COLOSTOMY N/A 07/23/2018   Procedure: COLOSTOMY;  Surgeon: Violeta Gelinas, MD;  Location: [facility] OR;  Service: General;  Laterality: N/A;  . CYSTOSCOPY W/ URETERAL STENT PLACEMENT N/A 07/15/2018   Procedure: RETROGRADE URETHROGRAM;  Surgeon: Marcine Matar, MD;  Location: [facility] OR;  Service: Urology;  Laterality: N/A;  . CYSTOSCOPY WITH LITHOLAPAXY N/A 05/06/2019   Procedure: CYSTOSCOPY BASKET BLADDER STONE EXTRACTION;  Surgeon: Malen Gauze, MD;  Location: [facility];  Service: Urology;  Laterality: N/A;  30 MINS  . CYSTOSTOMY N/A 05/06/2019   Procedure:  REPLACEMENT OF SUPRAPUBIC CATHETER;  Surgeon: Malen Gauze, MD;  Location: [facility];  Service: Urology;  Laterality: N/A;  . DEBRIDEMENT AND CLOSURE WOUND Left 03/04/2019   Procedure: Excision of hip wound with placement of Acell;  Surgeon: Peggye Form, DO;  [facility];  Service: Plastics;  Laterality: Left;  . ESOPHAGOGASTRODUODENOSCOPY N/A 08/14/2018   Procedure: ESOPHAGOGASTRODUODENOSCOPY (EGD);  Surgeon: Violeta Gelinas, MD;  Location: [facility] ENDOSCOPY;  Service: General;  Laterality: N/A;  bedside  . FACIAL RECONSTRUCTION SURGERY     X 2--once as a teenager and second time in his 7's  . HARDWARE REMOVAL Left 03/04/2019   Procedure: Left Hip Hardware Removal;  Surgeon: Roby Lofts, MD;  [facility];  Service: Orthopedics;  Laterality: Left;  . HIP PINNING,CANNULATED Left 07/12/2018   Procedure: CANNULATED HIP PINNING;  Surgeon: Roby Lofts, MD;  [facility];  Service: Orthopedics;  Laterality: Left;  . HIP SURGERY    . HOLMIUM LASER APPLICATION Right 07/15/2019   Procedure: HOLMIUM LASER APPLICATION;  Surgeon: Malen Gauze, MD;  Location: [facility];  Service: Urology;  Laterality: Right;  . I & D EXTREMITY Left 07/25/2018   Procedure: Debridement of buttock, scrotum and left leg, placement of acell and vac;  Surgeon: Peggye Form, DO;  [facility];  Service: Plastics;  Laterality: Left;  . I & D EXTREMITY N/A 08/06/2018   Procedure: Debridement of buttock, scrotum and left leg;  Surgeon: Peggye Form, DO;  [facility];  Service: Plastics;  Laterality: N/A;  . I & D EXTREMITY N/A 08/13/2018   Procedure: Debridement of buttock, scrotum and left leg, placement of acell and vac;  Surgeon: Peggye Form, DO;  [facility]: [facility];  Service: Plastics;  Laterality: N/A;  90 min, please  . INCISION AND DRAINAGE HIP Left 09/18/2019   Procedure: IRRIGATION AND DEBRIDEMENT HIP/ PELVIS WITH WOUND VAC PLACEMENT;   Surgeon: Roby Lofts, MD;  [facility]: [facility];  Service: Orthopedics;  Laterality: Left;  . INCISION AND DRAINAGE OF WOUND N/A 07/18/2018   Procedure: Debridement of left leg, buttocks and scrotal wound with placement of acell and Flexiseal;  Surgeon: Peggye Form, DO;  [facility]: [facility];  Service: Plastics;  Laterality: N/A;  .  INCISION AND DRAINAGE OF WOUND Left 08/29/2018   Procedure: Debridement of buttock, scrotum and left leg, placement of acell and vac;  Surgeon: Peggye Form, DO;  [facility]: [facility];  Service: Plastics;  Laterality: Left;  75 min, please  . INCISION AND DRAINAGE OF WOUND Bilateral 10/23/2018   Procedure: DEBRIDEMENT OF BUTTOCK,SCROTUM, AND LEG WOUNDS WITH PLACEMENT OF ACELL- BILATERAL 90 MIN;  Surgeon: Peggye Form, DO;  Location: [facility];  Service: Plastics;  Laterality: Bilateral;  . IR ANGIOGRAM PELVIS SELECTIVE OR SUPRASELECTIVE  07/10/2018  . IR ANGIOGRAM PELVIS SELECTIVE OR SUPRASELECTIVE  07/10/2018  . IR ANGIOGRAM SELECTIVE EACH ADDITIONAL VESSEL  07/10/2018  . IR EMBO ART  VEN HEMORR LYMPH EXTRAV  INC GUIDE ROADMAPPING  07/10/2018  . IR NEPHROSTOMY PLACEMENT LEFT  04/05/2019  . IR NEPHROSTOMY PLACEMENT RIGHT  05/31/2019  . IR US GUIDE BX ASP/DRAIN  07/10/2018  . IR US GUIDE VASC ACCESS RIGHT  07/10/2018  . IR VENO/EXT/UNI LEFT  07/10/2018  . IRRIGATION AND DEBRIDEMENT OF WOUND WITH SPLIT THICKNESS SKIN GRAFT Left 09/19/2018   Procedure: Debridement of gluteal wound with placement of acell to left leg.;  Surgeon: Peggye Form, DO;  [facility]: [facility];  Service: Plastics;  Laterality: Left;  2.5 hours, please  . LAPAROTOMY N/A 07/12/2018   Procedure: EXPLORATORY LAPAROTOMY;  Surgeon: Violeta Gelinas, MD;  Location: [facility] OR;  Service: General;  Laterality: N/A;  . LAPAROTOMY N/A 07/15/2018   Procedure: WOUND EXPLORATION; CLOSURE OF ABDOMEN;  Surgeon: Violeta Gelinas, MD;  Location: [facility] OR;  Service: General;  Laterality: N/A;  . LAPAROTOMY  07/10/2018    Procedure: Exploratory Laparotomy;  Surgeon: Berna Bue, MD;  Location: [facility] OR;  Service: General;;  . MASS EXCISION Left 09/18/2019   Procedure: EXCISION UPPER LEFT INNER THIGH WOUND;  Surgeon: Peggye Form, DO;  [facility];  Service: Plastics;  Laterality: Left;  . NEPHROLITHOTOMY Right 07/15/2019   Procedure: NEPHROLITHOTOMY PERCUTANEOUS;  Surgeon: Malen Gauze, MD;  Location: [facility];  Service: Urology;  Laterality: Right;  90 MINS  . PEG PLACEMENT N/A 08/14/2018   Procedure: PERCUTANEOUS ENDOSCOPIC GASTROSTOMY (PEG) PLACEMENT;  Surgeon: Violeta Gelinas, MD;  Location: [facility] ENDOSCOPY;  Service: General;  Laterality: N/A;  . PERCUTANEOUS TRACHEOSTOMY N/A 08/02/2018   Procedure: PERCUTANEOUS TRACHEOSTOMY;  Surgeon: Violeta Gelinas, MD;  Location: [facility] OR;  Service: General;  Laterality: N/A;  . RADIOLOGY WITH ANESTHESIA N/A 07/10/2018   Procedure: IR WITH ANESTHESIA;  Surgeon: Simonne Come, MD;  Location: [facility] OR;  Service: Radiology;  Laterality: N/A;  . RADIOLOGY WITH ANESTHESIA Right 07/10/2018   Procedure: Ir With Anesthesia;  Surgeon: Simonne Come, MD;  Location: [facility] OR;  Service: Radiology;  Laterality: Right;  . SCROTAL EXPLORATION N/A 07/15/2018   Procedure: SCROTUM DEBRIDEMENT;  Surgeon: Marcine Matar, MD;  Location: [facility] OR;  Service: Urology;  Laterality: N/A;  . SHOULDER SURGERY    . SKIN SPLIT GRAFT Right 09/19/2018   Procedure: Skin Graft Split Thickness;  Surgeon: Peggye Form, DO;  [facility];  Service: Plastics;  Laterality: Right;  . SKIN SPLIT GRAFT N/A 10/03/2018   Procedure: Split thickness skin graft to gluteal area with acell placement;  Surgeon: Peggye Form, DO;  Location[facility];  Service: Plastics;  Laterality: N/A;  3 hours, please  . VACUUM ASSISTED CLOSURE CHANGE N/A 07/12/2018   Procedure: ABDOMINAL VACUUM ASSISTED CLOSURE CHANGE and abdominal washout;  Surgeon: Violeta Gelinas, MD;  Location: [facility] OR;  Service: General;   Laterality: N/A;  . WOUND DEBRIDEMENT Left 07/23/2018   Procedure: DEBRIDEMENT LEFT BUTTOCK  WOUND;  Surgeon: Violeta Gelinas, MD;  Location: [facility] OR;  Service: General;  Laterality: Left;  . WOUND EXPLORATION Left 07/10/2018   Procedure: WOUND EXPLORATION LEFT GROIN;  Surgeon: Berna Bue, MD;  Location: [facility];  Service: General;  Laterality: Left;       Review of Systems  Constitutional: Negative for chills, diaphoresis, fatigue and fever.  Respiratory: Negative for cough, chest tightness, shortness of breath and wheezing.   Cardiovascular: Negative for chest pain.  Gastrointestinal: Positive for abdominal pain and nausea. Negative for diarrhea and vomiting.      Objective:    BP 122/80   Pulse 72   Temp 97.7 F (36.5 C) (Oral)   SpO2 100% Comment: 3L O2 Nursing note and vital signs reviewed.  Physical Exam Constitutional:      General: He is not in acute distress.    Appearance: He is well-developed.     Interventions: Nasal cannula in place.     Comments: Seated in the wheelchair; pleasant and lethargic  Cardiovascular:     Rate and Rhythm: Normal rate and regular rhythm.     Heart sounds: Normal heart sounds.  Pulmonary:     Effort: Pulmonary effort is normal.     Breath sounds: Normal breath sounds.  Skin:    General: Skin is warm and dry.  Neurological:     Mental Status: He is alert and oriented to person, place, and time.  Psychiatric:        Mood and Affect: Mood normal.      Depression screen [facility] 2/9 09/02/2020 07/02/2020 03/17/2020 12/30/2019 12/18/2019  Decreased Interest 1 3 0 0 0  Down, Depressed, Hopeless 1 2 1  0 0  PHQ - 2 Score 2 5 1  0 0  Altered sleeping - 3 - - -  Tired, decreased energy - 3 - - -  Change in appetite - 3 - - -  Feeling bad or failure about yourself  - 2 - - -  Trouble concentrating - 3 - - -  Moving slowly or fidgety/restless - 3 - - -  Suicidal thoughts - 0 - - -  PHQ-9 Score - 22 - - -  Some recent data might be hidden        Assessment & Plan:    Patient Active Problem List   Diagnosis Date Noted  . Multifocal pneumonia 10/14/2020  . Abnormal urinalysis 10/14/2020  . 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 Respiratory failure with hypoxia (HCC) 07/02/2020  . Dysphagia 07/02/2020  . Sepsis (HCC) 06/20/2020  . Therapeutic opioid induced constipation 04/11/2020  . Status post peripherally inserted central catheter (PICC) central line placement 03/17/2020  . Skin-picking disorder 03/12/2020  . CHF (congestive heart failure) (HCC) 01/27/2020  . Bone infection of pelvis (HCC) 12/12/2019  . Recurrent pain of right knee 11/08/2019  . Elevated blood pressure reading 11/05/2019  . Acute osteomyelitis of pelvic region and thigh (HCC)   . Acute deep vein thrombosis (DVT) of brachial vein of right upper extremity (HCC) 11/01/2019  . Myofascial pain dysfunction syndrome 08/05/2019  . Protein-calorie malnutrition, severe 06/01/2019  . Complicated urinary tract infection 05/30/2019  . Chronic osteomyelitis of pelvis, left (HCC) 03/07/2019  . Wound infection, posttraumatic 02/28/2019  . Hydronephrosis concurrent with and due to calculi of kidney and ureter 02/28/2019  . GERD (gastroesophageal reflux disease) 02/28/2019  . Suprapubic catheter (HCC) 02/28/2019  .  Protein-calorie malnutrition, moderate (HCC) 02/28/2019  . 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 Reactive depression 01/29/2019  . Chronic pain due to trauma 01/29/2019  . Neuropathic pain 01/29/2019  . Colostomy status (HCC) 01/14/2019  . Insomnia 01/14/2019  . Tachycardia 01/14/2019  . Current use of proton pump inhibitor 01/14/2019  . Wound of buttock 12/28/2018  . Anxiety and depression 12/17/2018  . Debility 11/23/2018  . Acute on chronic respiratory failure with hypoxia (HCC)   . Chronic pain syndrome   . Multiple traumatic injuries   . Pressure injury of skin 08/13/2018  . Urethral injury 07/13/2018     Problem  List Items Addressed This Visit      Respiratory   Respiratory failure with hypoxia (HCC)    Currently on 3 L of oxygen via nasal cannula. Has had a couple episodes of pneumonia with concern for possible aspiration origin. Encouraged techniques to decrease aspiration. Has appointment with pulmonology. Prevnar updated today.        Musculoskeletal and Integument   Chronic osteomyelitis of pelvis, left (HCC) - Primary    Christopher [name] continues to remain stable with current dose of ciprofloxacin and oral vancomycin for C. difficile prevention.  Currently suppressed for multidrug-resistant Pseudomonas.  Wishes to continue with suppression versus surgery at this time.  Continue current dose of ciprofloxacin. Will plan to follow up in 3 months or sooner if needed.         Other   Chronic pain due to trauma    Christopher [name] continues to have abdominal pain of unclear origin and question if this is related to chronic pain.  Currently treated by physical medicine and rehabilitation with oxycodone.  Most recent CT scan from a couple of weeks ago with no significant findings to explain abdominal pain.  Concern for his decreased oral intake if unable to take an adequate protein his wounds may worsen and increase risk for further infection and complicate healing. Recommend follow up with primary care and GI to optimize his nutritional status.        Protein-calorie malnutrition, severe    Other Visit Diagnoses    Need for pneumococcal vaccination       Relevant Orders   Pneumococcal conjugate vaccine 13-valent IM (Completed)       I am having Collier B. Degen maintain his multivitamin with minerals, dronabinol, melatonin, Blood Pressure Cuff, methocarbamol, mirabegron ER, oxybutynin, naloxone, dextromethorphan-guaiFENesin, Probiotic Colon Support, PARoxetine, vancomycin, ciprofloxacin, gabapentin, lidocaine, senna-docusate, famotidine, feeding supplement (PRO-STAT 64), traZODone, naloxegol  oxalate, albuterol, clonazePAM, promethazine, oxyCODONE-acetaminophen, and fentaNYL.   Follow-up: Return in about 3 months (around 01/27/2021), or if symptoms worsen or fail to improve.   Marcos Eke, MSN, FNP-C Nurse Practitioner [facility] Main number: [phone]

## 2020-11-04 ENCOUNTER — Other Ambulatory Visit: Payer: Self-pay

## 2020-11-04 ENCOUNTER — Encounter
Payer: No Typology Code available for payment source | Attending: Physical Medicine and Rehabilitation | Admitting: Physical Medicine and Rehabilitation

## 2020-11-04 ENCOUNTER — Encounter: Payer: Self-pay | Admitting: Physical Medicine and Rehabilitation

## 2020-11-04 VITALS — BP 113/78 | HR 84 | Temp 98.7°F | Ht 75.0 in | Wt 160.0 lb

## 2020-11-04 DIAGNOSIS — M86552 Other chronic hematogenous osteomyelitis, left femur: Secondary | ICD-10-CM | POA: Insufficient documentation

## 2020-11-04 DIAGNOSIS — F064 Anxiety 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: Secondary | ICD-10-CM | POA: Diagnosis present

## 2020-11-04 DIAGNOSIS — E46 Unspecified protein-calorie malnutrition: Secondary | ICD-10-CM | POA: Diagnosis present

## 2020-11-04 DIAGNOSIS — Z9981 Dependence on supplemental oxygen: Secondary | ICD-10-CM | POA: Diagnosis present

## 2020-11-04 DIAGNOSIS — R5383 Other fatigue: Secondary | ICD-10-CM | POA: Insufficient documentation

## 2020-11-04 MED ORDER — BUSPIRONE HCL 5 MG PO TABS: 5.0000 mg | ORAL_TABLET | Freq: Three times a day (TID) | ORAL | 3 refills | Status: DC

## 2020-11-04 NOTE — Progress Notes (Signed)
Subjective:    Patient ID: Christopher [name], male    DOB: 07-Feb-1966, 55 y.o.   MRN: [id number]  HPI    Pt is a 55yr old male with hx of Multitrauma- causing L femoral neck fx, degloving of L hip to going/scrotum, bladder neck trauma- got SPC, developed compartment syndrome -s/p surgery for that; also diverting colostomy, skin grafts, and s/p trachand PEG- PEG is out. Also has moderate to severe protein-calorie malnutrition, anxiety due to multitrauma, and chronic pain.S/P screw removal and on IV ABX for L hip osteomyelitis. 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 B/L tennis elbow new-and B/L pending/forming ulnar neuropathy Osteomyelitis of L hip found again-on PO vanc and Cipro  Here for f/u on chronic pain.   Has been in and out of hospital  Today is a "good day"- hasn't had many lately.   Saw Plastics 2-3 weeks ago.  Last time he got out of hospital- IV ABX for pneumonia- Augmentin for last few days.  Coughing up "black stuff". Was told it was the "infection coming out" by hospital doctor.  Was still coughing up black/brownish stuff- AFTER finished PO ABX this last time, but has cleared up now.   Issues with stomach- will get a couple of bites up- and starts hurting/epigastric.  Was told it was esophageal "mobility" was "weak".   ID doctor thinks saliva is building up every time- did swallow test- low/mild chance of aspiration.  Was told to sit up, extra swallow/tuck chin; and and staying up 30 minutes after eats-  Right now he sits up IN bed or EOB to eat.  Lost 20+ lbs lately since won't eat.  ID wants to put in PEG if won't eat.   Can drink 4-5 swallows- of mountain dew and when lays down- mountain dew comes back up!  Cut way down on mountain dew- drinks water with crystal light now except 1 Mountain dew/day.     ID said to put on Pepcid- less likely to interfere Used to be on  Prilosec 20 mg daily before, but doesn't think Sx's are worse/better since put on Prilosec.    Has tried to wean O2-  On 3L currently If cuts down to 2L- will drop to 95% And feels real SOB.    Also feeling anxious so much lately- seeing Dr Kieth Brightly soon for visit.   He feels anxiety is a major part of what's going on.  Paxil 40 mg daily- taking daily.  Klonopin- takes 0.25 mg q6 hours Gets lethargic with pain meds- been giving q3 hours- Oxycodone.   Upset that bed is messed up- been 2 weeks- air mattress- middle chamber not blowing up right.    Social Hx: Doesn't smoke- stopped day of accident.      Pain Inventory Average Pain 9 Pain Right Now 9 My pain is sharp, burning, dull, stabbing, tingling and aching  LOCATION OF PAIN  Back, groin, hip  BOWEL Number of stools per week: na Oral laxative use Yes  Type of laxative miralax, senna, movantik Enema or suppository use No  History of colostomy Yes  Incontinent No   BLADDER Suprapubic In and out cath, frequency na Able to self cath na Bladder incontinence No  Frequent urination No  Leakage with coughing No  Difficulty starting stream No  Incomplete bladder emptying No    Mobility walk with assistance use a walker ability  to climb steps?  no do you drive?  no use a wheelchair needs help with transfers  Function I need assistance with the following:  dressing, bathing, toileting, meal prep, household duties and shopping  Neuro/Psych bladder control problems bowel control problems weakness numbness tremor tingling trouble walking spasms dizziness confusion depression anxiety loss of taste or smell  Prior Studies Any changes since last visit?  no  Physicians involved in your care Any changes since last visit?  no   Family History  Problem Relation Age of Onset  . Breast cancer Mother        with mets to the bones   Social History   Socioeconomic History  . Marital status: Married     Spouse name: Not on file  . Number of children: Not on file  . Years of education: Not on file  . Highest education level: Not on file  Occupational History  . Occupation: Disable  Tobacco Use  . Smoking status: Former Smoker    Packs/day: 1.00    Years: 20.00    Pack years: 20.00    Types: Cigarettes    Quit date: 07/10/2018    Years since quitting: 2.3  . Smokeless tobacco: Never Used  Vaping Use  . Vaping Use: Never used  Substance and Sexual Activity  . Alcohol use: Never  . Drug use: Yes    Types: Oxycodone, Fentanyl    Comment: Fentanyl patch/oxycodone since 06/2018  . Sexual activity: Yes  Other Topics Concern  . Not on file  Social History Narrative   ** Merged History Encounter **       Social Determinants of Health   Financial Resource Strain: Not on file  Food Insecurity: Not on file  Transportation Needs: Not on file  Physical Activity: Not on file  Stress: Not on file  Social Connections: Not on file   Past Surgical History:  Procedure Laterality Date  . APPLICATION OF A-CELL OF BACK N/A 08/06/2018   Procedure: Application Of A-Cell Of Back;  Surgeon: Peggye Form, DO;  [facility]: [facility];  Service: Plastics;  Laterality: N/A;  . APPLICATION OF A-CELL OF EXTREMITY Left 08/06/2018   Procedure: Application Of A-Cell Of Extremity;  Surgeon: Peggye Form, DO;  Location: [facility];  Service: Plastics;  Laterality: Left;  . APPLICATION OF A-CELL OF EXTREMITY Left 09/18/2019   Procedure: APPLICATION OF A-CELL OF EXTREMITY;  Surgeon: Peggye Form, DO;  Location: [facility];  Service: Plastics;  Laterality: Left;  . APPLICATION OF WOUND VAC  07/12/2018   Procedure: Application Of Wound Vac to the Left Thigh and Scrotum.;  Surgeon: Roby Lofts, MD;  Location: [facility];  Service: Orthopedics;;  . APPLICATION OF WOUND VAC  07/10/2018   Procedure: Application Of Wound Vac;  Surgeon: Berna Bue, MD;  Location: [facility];  Service: General;;  . COLON  SURGERY  2020   colostomy  . COLOSTOMY N/A 07/23/2018   Procedure: COLOSTOMY;  Surgeon: Violeta Gelinas, MD;  Location: [facility] OR;  Service: General;  Laterality: N/A;  . CYSTOSCOPY W/ URETERAL STENT PLACEMENT N/A 07/15/2018   Procedure: RETROGRADE URETHROGRAM;  Surgeon: Marcine Matar, MD;  Location: [facility] OR;  Service: Urology;  Laterality: N/A;  . CYSTOSCOPY WITH LITHOLAPAXY N/A 05/06/2019   Procedure: CYSTOSCOPY BASKET BLADDER STONE EXTRACTION;  Surgeon: Malen Gauze, MD;  Location: [facility];  Service: Urology;  Laterality: N/A;  30 MINS  . CYSTOSTOMY N/A 05/06/2019   Procedure: REPLACEMENT OF  SUPRAPUBIC CATHETER;  Surgeon: Malen GauzeMcKenzie, Patrick L, MD;  [facility] [place];  Service: Urology;  Laterality: N/A;  . DEBRIDEMENT AND CLOSURE WOUND Left 03/04/2019   Procedure: Excision of hip wound with placement of Acell;  Surgeon: Peggye Formillingham, Claire S, DO;  [facility]: [facility];  Service: Plastics;  Laterality: Left;  . ESOPHAGOGASTRODUODENOSCOPY N/A 08/14/2018   Procedure: ESOPHAGOGASTRODUODENOSCOPY (EGD);  Surgeon: Violeta Gelinashompson, Burke, MD;  [facility];  Service: General;  Laterality: N/A;  bedside  . FACIAL RECONSTRUCTION SURGERY     X 2--once as a teenager and second time in his 6330's  . HARDWARE REMOVAL Left 03/04/2019   Procedure: Left Hip Hardware Removal;  Surgeon: Roby LoftsHaddix, Kevin P, MD;  Location: [facility];  Service: Orthopedics;  Laterality: Left;  . HIP PINNING,CANNULATED Left 07/12/2018   Procedure: CANNULATED HIP PINNING;  Surgeon: Roby LoftsHaddix, Kevin P, MD;  [facility]: [facility];  Service: Orthopedics;  Laterality: Left;  . HIP SURGERY    . HOLMIUM LASER APPLICATION Right 07/15/2019   Procedure: HOLMIUM LASER APPLICATION;  Surgeon: Malen GauzeMcKenzie, Patrick L, MD;  [facility] [place];  Service: Urology;  Laterality: Right;  . I & D EXTREMITY Left 07/25/2018   Procedure: Debridement of buttock, scrotum and left leg, placement of acell and vac;  Surgeon:  Peggye Formillingham, Claire S, DO;  [facility]: [facility];  Service: Plastics;  Laterality: Left;  . I & D EXTREMITY N/A 08/06/2018   Procedure: Debridement of buttock, scrotum and left leg;  Surgeon: Peggye Formillingham, Claire S, DO;  [facility]: [facility];  Service: Plastics;  Laterality: N/A;  . I & D EXTREMITY N/A 08/13/2018   Procedure: Debridement of buttock, scrotum and left leg, placement of acell and vac;  Surgeon: Peggye Formillingham, Claire S, DO;  [facility]: [facility];  Service: Plastics;  Laterality: N/A;  90 min, please  . INCISION AND DRAINAGE HIP Left 09/18/2019   Procedure: IRRIGATION AND DEBRIDEMENT HIP/ PELVIS WITH WOUND VAC PLACEMENT;  Surgeon: Roby LoftsHaddix, Kevin P, MD;  [facility]: [facility];  Service: Orthopedics;  Laterality: Left;  . INCISION AND DRAINAGE OF WOUND N/A 07/18/2018   Procedure: Debridement of left leg, buttocks and scrotal wound with placement of acell and Flexiseal;  Surgeon: Peggye Formillingham, Claire S, DO;  [facility]: [facility];  Service: Plastics;  Laterality: N/A;  . INCISION AND DRAINAGE OF WOUND Left 08/29/2018   Procedure: Debridement of buttock, scrotum and left leg, placement of acell and vac;  Surgeon: Peggye Formillingham, Claire S, DO;  [facility]: [facility];  Service: Plastics;  Laterality: Left;  75 min, please  . INCISION AND DRAINAGE OF WOUND Bilateral 10/23/2018   Procedure: DEBRIDEMENT OF BUTTOCK,SCROTUM, AND LEG WOUNDS WITH PLACEMENT OF ACELL- BILATERAL 90 MIN;  Surgeon: Peggye Formillingham, Claire S, DO;  [facility]: [facility];  Service: Plastics;  Laterality: Bilateral;  . IR ANGIOGRAM PELVIS SELECTIVE OR SUPRASELECTIVE  07/10/2018  . IR ANGIOGRAM PELVIS SELECTIVE OR SUPRASELECTIVE  07/10/2018  . IR ANGIOGRAM SELECTIVE EACH ADDITIONAL VESSEL  07/10/2018  . IR EMBO ART  VEN HEMORR LYMPH EXTRAV  INC GUIDE ROADMAPPING  07/10/2018  . IR NEPHROSTOMY PLACEMENT LEFT  04/05/2019  . IR NEPHROSTOMY PLACEMENT RIGHT  05/31/2019  . IR US GUIDE BX ASP/DRAIN  07/10/2018  . IR US GUIDE VASC ACCESS RIGHT  07/10/2018  . IR VENO/EXT/UNI LEFT  07/10/2018  .  IRRIGATION AND DEBRIDEMENT OF WOUND WITH SPLIT THICKNESS SKIN GRAFT Left 09/19/2018   Procedure: Debridement of gluteal wound with placement of acell to left leg.;  Surgeon: Peggye Formillingham, Claire S, DO;  [facility];  Service: Plastics;  Laterality: Left;  2.5 hours, please  . LAPAROTOMY N/A 07/12/2018   Procedure: EXPLORATORY LAPAROTOMY;  Surgeon: Violeta Gelinas, MD;  Location: [facility] OR;  Service: General;  Laterality: N/A;  . LAPAROTOMY N/A 07/15/2018   Procedure: WOUND EXPLORATION; CLOSURE OF ABDOMEN;  Surgeon: Violeta Gelinas, MD;  Location: [facility] OR;  Service: General;  Laterality: N/A;  . LAPAROTOMY  07/10/2018   Procedure: Exploratory Laparotomy;  Surgeon: Berna Bue, MD;  Location: [facility] OR;  Service: General;;  . MASS EXCISION Left 09/18/2019   Procedure: EXCISION UPPER LEFT INNER THIGH WOUND;  Surgeon: Peggye Form, DO;  [facility];  Service: Plastics;  Laterality: Left;  . NEPHROLITHOTOMY Right 07/15/2019   Procedure: NEPHROLITHOTOMY PERCUTANEOUS;  Surgeon: Malen Gauze, MD;  Location: [facility];  Service: Urology;  Laterality: Right;  90 MINS  . PEG PLACEMENT N/A 08/14/2018   Procedure: PERCUTANEOUS ENDOSCOPIC GASTROSTOMY (PEG) PLACEMENT;  Surgeon: Violeta Gelinas, MD;  Location: [facility] ENDOSCOPY;  Service: General;  Laterality: N/A;  . PERCUTANEOUS TRACHEOSTOMY N/A 08/02/2018   Procedure: PERCUTANEOUS TRACHEOSTOMY;  Surgeon: Violeta Gelinas, MD;  Location: [facility] OR;  Service: General;  Laterality: N/A;  . RADIOLOGY WITH ANESTHESIA N/A 07/10/2018   Procedure: IR WITH ANESTHESIA;  Surgeon: Simonne Come, MD;  Location: [place] OR;  Service: Radiology;  Laterality: N/A;  . RADIOLOGY WITH ANESTHESIA Right 07/10/2018   Procedure: Ir With Anesthesia;  Surgeon: Simonne Come, MD;  Location: [facility] OR;  Service: Radiology;  Laterality: Right;  . SCROTAL EXPLORATION N/A 07/15/2018   Procedure: SCROTUM DEBRIDEMENT;  Surgeon: Marcine Matar, MD;  Location: [facility] OR;  Service: Urology;   Laterality: N/A;  . SHOULDER SURGERY    . SKIN SPLIT GRAFT Right 09/19/2018   Procedure: Skin Graft Split Thickness;  Surgeon: Peggye Form, DO;  [facility];  Service: Plastics;  Laterality: Right;  . SKIN SPLIT GRAFT N/A 10/03/2018   Procedure: Split thickness skin graft to gluteal area with acell placement;  Surgeon: Peggye Form, DO;  [facility];  Service: Plastics;  Laterality: N/A;  3 hours, please  . VACUUM ASSISTED CLOSURE CHANGE N/A 07/12/2018   Procedure: ABDOMINAL VACUUM ASSISTED CLOSURE CHANGE and abdominal washout;  Surgeon: Violeta Gelinas, MD;  Location: [facility] OR;  Service: General;  Laterality: N/A;  . WOUND DEBRIDEMENT Left 07/23/2018   Procedure: DEBRIDEMENT LEFT BUTTOCK  WOUND;  Surgeon: Violeta Gelinas, MD;  Location: [facility] OR;  Service: General;  Laterality: Left;  . WOUND EXPLORATION Left 07/10/2018   Procedure: WOUND EXPLORATION LEFT GROIN;  Surgeon: Berna Bue, MD;  Location[facility] OR;  Service: General;  Laterality: Left;   Past Medical History:  Diagnosis Date  . Acute on chronic respiratory failure with hypoxia (HCC) 06/2018   trach removed 11-16-2018, on vent from jan until may 2020 - uses albuterol prn  . Anxiety   . Bacteremia due to Pseudomonas 06/2018  . Chronic osteomyelitis (HCC)   . Chronic pain syndrome   . Clostridium difficile colitis 10/30/2019   tx with abx   . Depression   . DVT (deep venous thrombosis) (HCC) 2020   right brachial post PICC line  . History of blood transfusion 06/2018  . History of Clostridioides difficile colitis   . History of kidney stones   . Hypertension    norvasc d/c by pcp on 11/05/19  . Multiple traumatic injuries   . Penile pain 11/18/2019  . Pneumonia 11/2009   2020 x 2  .  Walker as ambulation aid   . Wheelchair bound    electric  . Wound discharge    left hip wound with bloody/clear drainage change dressing q day surgilube with gauze, between legs wound using calcium algenate pad bid   BP 113/78    Pulse 84   Temp 98.7 F (37.1 C)   Ht 6\' 3"  (1.905 m) Comment: pt reported  Wt 160 lb (72.6 kg) Comment: pt reported, in wheelchair  SpO2 98%   BMI 20.00 kg/m   Opioid Risk Score:   Fall Risk Score:  `1  Depression screen PHQ 2/9  Depression screen [facility] 2/9 09/02/2020 07/02/2020 03/17/2020 12/30/2019 12/18/2019  Decreased Interest 1 3 0 0 0  Down, Depressed, Hopeless 1 2 1  0 0  PHQ - 2 Score 2 5 1  0 0  Altered sleeping - 3 - - -  Tired, decreased energy - 3 - - -  Change in appetite - 3 - - -  Feeling bad or failure about yourself  - 2 - - -  Trouble concentrating - 3 - - -  Moving slowly or fidgety/restless - 3 - - -  Suicidal thoughts - 0 - - -  PHQ-9 Score - 22 - - -  Some recent data might be hidden     Review of Systems  Musculoskeletal: Positive for back pain.       Groin, hip pain  Neurological: Positive for dizziness, weakness and numbness.  Psychiatric/Behavioral: Positive for dysphoric mood. The patient is nervous/anxious.   All other systems reviewed and are negative.      Objective:   Physical Exam  Awake, alert, frustrated, on O2 by [place], in power w/c, accompanied by wife, NAD       Assessment & Plan:    1. Suggest Calling GI back- sounds like has Reflux Sx's. Esp when describes spitting up mountain dew after lays down.   2. Still on O2- 3L-put in referral to [place] for Pulmonary- since cannot wean off O2- and gets SOB when wife tries to wean- has also had pneumonia multiple times - at least 3x since January 2022.   3. We discussed pain, not eating, and anxiety- scared of hurting esp with stomach pain.    4. Call GI back- I think he needs an endoscopy- with severe reflux Sx's. If GI doesn't feel there's anything else to do, we might need to try another GI, because pt has lost >20 lbs- in last 3 months- and has moderate or worse malnutrition his Albumin 3 weeks ago was 2.9-3.0, but had been 3.5 - 3 months ago.   5. Needs air mattress to be fixe-d  middle chamber not blown up right x 2 weeks.    6. We needs back off to Oxy q4 hours-  Esp since not eating well-   7. Pt so fatigued, will check Vitamin D.   8. Will check CMP to see where albumin is; and prealbumin since just not eating.   9. Might need PEG/feeding tube if keeps losing weight- will check CMP to see how things are doing.   10. Needs to start getting up- in spite of pain. NEEDS TO GET UP! To build up muscles. Could help pain. Will order H/H for PT- to help with strengthening.   11.  Will add Buspar 5 mg TID x 1 week then 10 mg TID for anxiety  12. Doesn't need refills of pain meds today.   13. F/U in 6-8 weeks double appointment.  I spent a total of 50 minutes on appointment- as detailed above and educating on plan, esp Buspar and anxiety techniques

## 2020-11-04 NOTE — Patient Instructions (Signed)
  1. Suggest Calling GI back- sounds like has Reflux Sx's. Esp when describes spitting up mountain dew after lays down.   2. Still on O2- 3L-put in referral to [place] for Pulmonary- since cannot wean off O2- and gets SOB when wife tries to wean- has also had pneumonia multiple times - at least 3x since January 2022.   3. We discussed pain, not eating, and anxiety- scared of hurting esp with stomach pain.    4. Call GI back- I think he needs an endoscopy- with severe reflux Sx's. If GI doesn't feel there's anything else to do, we might need to try another GI, because pt has lost >20 lbs- in last 3 months- and has moderate or worse malnutrition his Albumin 3 weeks ago was 2.9-3.0, but had been 3.5 - 3 months ago.   5. Needs air mattress to be fixe-d middle chamber not blown up right x 2 weeks.    6. We needs back off to Oxy q4 hours-  Esp since not eating well-   7. Pt so fatigued, will check Vitamin D.   8. Will check CMP to see where albumin is; and prealbumin since just not eating.   9. Might need PEG/feeding tube if keeps losing weight- will check CMP to see how things are doing.   10. Needs to start getting up- in spite of pain. NEEDS TO GET UP! To build up muscles. Could help pain. Will order H/H for PT- to help with strengthening.   11.  Will add Buspar 5 mg TID x 1 week then 10 mg TID for anxiety  12. Doesn't need refills of pain meds today.   13. F/U in 6-8 weeks double appointment.

## 2020-11-06 ENCOUNTER — Other Ambulatory Visit: Payer: Self-pay

## 2020-11-06 ENCOUNTER — Encounter: Payer: Self-pay | Admitting: *Deleted

## 2020-11-06 DIAGNOSIS — Z9189 Other specified personal risk factors, not elsewhere classified: Secondary | ICD-10-CM

## 2020-11-06 MED ORDER — VANCOMYCIN HCL 125 MG PO CAPS: 125.0000 mg | ORAL_CAPSULE | Freq: Every day | ORAL | 1 refills | Status: DC

## 2020-11-06 NOTE — Addendum Note (Signed)
Addended by: Tressa Busman T on: 11/06/2020 12:00 PM   Modules accepted: Orders

## 2020-11-12 ENCOUNTER — Telehealth: Payer: Self-pay

## 2020-11-12 NOTE — Telephone Encounter (Signed)
Case Manager called checking on status on Pulmonary referral.   States patient urgently needs to get seen with Pulmonary.   Would like to know if office could assist with referral entered by Dr. [name] to get patient seen as soon as possible with Pulmonary.     Please follow up in regard.

## 2020-11-22 ENCOUNTER — Emergency Department (HOSPITAL_COMMUNITY): Payer: No Typology Code available for payment source

## 2020-11-22 ENCOUNTER — Other Ambulatory Visit: Payer: Self-pay

## 2020-11-22 ENCOUNTER — Inpatient Hospital Stay (HOSPITAL_COMMUNITY)
Admission: EM | Admit: 2020-11-22 | Discharge: 2020-11-26 | DRG: 092 | Disposition: A | Discharge status: Home Health | Payer: No Typology Code available for payment source | Attending: Internal Medicine | Admitting: Internal Medicine

## 2020-11-22 ENCOUNTER — Encounter (HOSPITAL_COMMUNITY): Payer: Self-pay | Admitting: *Deleted

## 2020-11-22 ENCOUNTER — Observation Stay (HOSPITAL_COMMUNITY): Payer: No Typology Code available for payment source

## 2020-11-22 DIAGNOSIS — Z87891 Personal history of nicotine dependence: Secondary | ICD-10-CM

## 2020-11-22 DIAGNOSIS — J9611 Chronic respiratory failure with hypoxia: Secondary | ICD-10-CM | POA: Diagnosis present

## 2020-11-22 DIAGNOSIS — M25552 Pain in left hip: Secondary | ICD-10-CM | POA: Diagnosis not present

## 2020-11-22 DIAGNOSIS — F419 Anxiety disorder, unspecified: Secondary | ICD-10-CM | POA: Diagnosis present

## 2020-11-22 DIAGNOSIS — R748 Abnormal levels of other serum enzymes: Secondary | ICD-10-CM | POA: Diagnosis present

## 2020-11-22 DIAGNOSIS — Z79891 Long term (current) use of opiate analgesic: Secondary | ICD-10-CM

## 2020-11-22 DIAGNOSIS — G894 Chronic pain syndrome: Secondary | ICD-10-CM | POA: Diagnosis not present

## 2020-11-22 DIAGNOSIS — Z8701 Personal history of pneumonia (recurrent): Secondary | ICD-10-CM | POA: Diagnosis not present

## 2020-11-22 DIAGNOSIS — Z86718 Personal history of other venous thrombosis and embolism: Secondary | ICD-10-CM

## 2020-11-22 DIAGNOSIS — D649 Anemia, unspecified: Secondary | ICD-10-CM | POA: Diagnosis present

## 2020-11-22 DIAGNOSIS — R1310 Dysphagia, unspecified: Secondary | ICD-10-CM | POA: Diagnosis present

## 2020-11-22 DIAGNOSIS — Z9359 Other cystostomy status: Secondary | ICD-10-CM

## 2020-11-22 DIAGNOSIS — R110 Nausea: Secondary | ICD-10-CM | POA: Diagnosis present

## 2020-11-22 DIAGNOSIS — I10 Essential (primary) hypertension: Secondary | ICD-10-CM | POA: Diagnosis present

## 2020-11-22 DIAGNOSIS — Z9189 Other specified personal risk factors, not elsewhere classified: Secondary | ICD-10-CM

## 2020-11-22 DIAGNOSIS — M8660 Other chronic osteomyelitis, unspecified site: Secondary | ICD-10-CM | POA: Diagnosis not present

## 2020-11-22 DIAGNOSIS — Z87442 Personal history of urinary calculi: Secondary | ICD-10-CM

## 2020-11-22 DIAGNOSIS — Z933 Colostomy status: Secondary | ICD-10-CM

## 2020-11-22 DIAGNOSIS — G4700 Insomnia, unspecified: Secondary | ICD-10-CM | POA: Diagnosis present

## 2020-11-22 DIAGNOSIS — K219 Gastro-esophageal reflux disease without esophagitis: Secondary | ICD-10-CM | POA: Diagnosis present

## 2020-11-22 DIAGNOSIS — Z993 Dependence on wheelchair: Secondary | ICD-10-CM | POA: Diagnosis not present

## 2020-11-22 DIAGNOSIS — F32A Depression, unspecified: Secondary | ICD-10-CM | POA: Diagnosis present

## 2020-11-22 DIAGNOSIS — Z1624 Resistance to multiple antibiotics: Secondary | ICD-10-CM | POA: Diagnosis present

## 2020-11-22 DIAGNOSIS — B965 Pseudomonas (aeruginosa) (mallei) (pseudomallei) as the cause of diseases classified elsewhere: Secondary | ICD-10-CM | POA: Diagnosis present

## 2020-11-22 DIAGNOSIS — F339 Major depressive disorder, recurrent, unspecified: Secondary | ICD-10-CM | POA: Diagnosis present

## 2020-11-22 DIAGNOSIS — M86652 Other chronic osteomyelitis, left thigh: Secondary | ICD-10-CM | POA: Diagnosis present

## 2020-11-22 DIAGNOSIS — Z79899 Other long term (current) drug therapy: Secondary | ICD-10-CM

## 2020-11-22 DIAGNOSIS — R52 Pain, unspecified: Secondary | ICD-10-CM

## 2020-11-22 DIAGNOSIS — Z885 Allergy status to narcotic agent status: Secondary | ICD-10-CM

## 2020-11-22 DIAGNOSIS — M8668 Other chronic osteomyelitis, other site: Secondary | ICD-10-CM | POA: Diagnosis present

## 2020-11-22 DIAGNOSIS — M869 Osteomyelitis, unspecified: Secondary | ICD-10-CM | POA: Diagnosis present

## 2020-11-22 DIAGNOSIS — Z20822 Contact with and (suspected) exposure to covid-19: Secondary | ICD-10-CM | POA: Diagnosis present

## 2020-11-22 LAB — CBC WITH DIFFERENTIAL/PLATELET
Abs Immature Granulocytes: 0.02 10*3/uL (ref 0.00–0.07)
Basophils Absolute: 0 10*3/uL (ref 0.0–0.1)
Basophils Relative: 0 %
Eosinophils Absolute: 0.3 10*3/uL (ref 0.0–0.5)
Eosinophils Relative: 5 %
HCT: 37 % — ABNORMAL LOW (ref 39.0–52.0)
Hemoglobin: 11.3 g/dL — ABNORMAL LOW (ref 13.0–17.0)
Immature Granulocytes: 0 %
Lymphocytes Relative: 11 %
Lymphs Abs: 0.6 10*3/uL — ABNORMAL LOW (ref 0.7–4.0)
MCH: 29.3 pg (ref 26.0–34.0)
MCHC: 30.5 g/dL (ref 30.0–36.0)
MCV: 95.9 fL (ref 80.0–100.0)
Monocytes Absolute: 0.2 10*3/uL (ref 0.1–1.0)
Monocytes Relative: 4 %
Neutro Abs: 4.4 10*3/uL (ref 1.7–7.7)
Neutrophils Relative %: 80 %
Platelets: 274 10*3/uL (ref 150–400)
RBC: 3.86 MIL/uL — ABNORMAL LOW (ref 4.22–5.81)
RDW: 13 % (ref 11.5–15.5)
WBC: 5.5 10*3/uL (ref 4.0–10.5)
nRBC: 0 % (ref 0.0–0.2)

## 2020-11-22 LAB — COMPREHENSIVE METABOLIC PANEL
ALT: 46 U/L — ABNORMAL HIGH (ref 0–44)
AST: 33 U/L (ref 15–41)
Albumin: 3.1 g/dL — ABNORMAL LOW (ref 3.5–5.0)
Alkaline Phosphatase: 161 U/L — ABNORMAL HIGH (ref 38–126)
Anion gap: 11 (ref 5–15)
BUN: 8 mg/dL (ref 6–20)
CO2: 21 mmol/L — ABNORMAL LOW (ref 22–32)
Calcium: 9.8 mg/dL (ref 8.9–10.3)
Chloride: 107 mmol/L (ref 98–111)
Creatinine, Ser: 0.68 mg/dL (ref 0.61–1.24)
GFR, Estimated: >60 mL/min (ref >60)
Glucose, Bld: 112 mg/dL — ABNORMAL HIGH (ref 70–99)
Potassium: 3.6 mmol/L (ref 3.5–5.1)
Sodium: 139 mmol/L (ref 135–145)
Total Bilirubin: 0.8 mg/dL (ref 0.3–1.2)
Total Protein: 7.4 g/dL (ref 6.5–8.1)

## 2020-11-22 LAB — CK: Total CK: 30 U/L — ABNORMAL LOW (ref 49–397)

## 2020-11-22 LAB — RESP PANEL BY RT-PCR (FLU A&B, COVID) ARPGX2
Influenza A by PCR: NEGATIVE
Influenza B by PCR: NEGATIVE
SARS Coronavirus 2 by RT PCR: NEGATIVE

## 2020-11-22 LAB — C-REACTIVE PROTEIN: CRP: 5.4 mg/dL — ABNORMAL HIGH (ref <1.0)

## 2020-11-22 LAB — SEDIMENTATION RATE: Sed Rate: 62 mm/hr — ABNORMAL HIGH (ref 0–16)

## 2020-11-22 MED ORDER — ALBUTEROL SULFATE (2.5 MG/3ML) 0.083% IN NEBU: 2.5000 mg | INHALATION_SOLUTION | Freq: Four times a day (QID) | RESPIRATORY_TRACT | Status: DC | PRN

## 2020-11-22 MED ORDER — PAROXETINE HCL 20 MG PO TABS
40.0000 mg | ORAL_TABLET | Freq: Every day | ORAL | Status: DC
  Administered 2020-11-22 – 2020-11-25 (×4): 40 mg via ORAL
  Filled 2020-11-22 (×4): qty 2

## 2020-11-22 MED ORDER — ADULT MULTIVITAMIN W/MINERALS CH
1.0000 | ORAL_TABLET | Freq: Every day | ORAL | Status: DC
  Administered 2020-11-23 – 2020-11-26 (×4): 1 via ORAL
  Filled 2020-11-22 (×5): qty 1

## 2020-11-22 MED ORDER — SODIUM CHLORIDE 0.9% FLUSH: 3.0000 mL | INTRAVENOUS | Status: DC | PRN

## 2020-11-22 MED ORDER — METHOCARBAMOL 500 MG PO TABS: 500.0000 mg | ORAL_TABLET | Freq: Four times a day (QID) | ORAL | Status: DC

## 2020-11-22 MED ORDER — PRO-STAT 64 PO LIQD: 30.0000 mL | Freq: Three times a day (TID) | ORAL | Status: DC

## 2020-11-22 MED ORDER — HYDROMORPHONE HCL 1 MG/ML IJ SOLN
1.0000 mg | Freq: Once | INTRAMUSCULAR | Status: AC
Start: 2020-11-22 — End: 2020-11-22
  Administered 2020-11-22: 1 mg via INTRAVENOUS
  Filled 2020-11-22: qty 1

## 2020-11-22 MED ORDER — FAMOTIDINE 20 MG PO TABS: 20.0000 mg | ORAL_TABLET | Freq: Every day | ORAL | Status: DC

## 2020-11-22 MED ORDER — IOHEXOL 300 MG/ML  SOLN
80.0000 mL | Freq: Once | INTRAMUSCULAR | Status: AC | PRN
  Administered 2020-11-22: 80 mL via INTRAVENOUS

## 2020-11-22 MED ORDER — OXYCODONE-ACETAMINOPHEN 10-325 MG PO TABS: 1.0000 | ORAL_TABLET | ORAL | Status: DC | PRN

## 2020-11-22 MED ORDER — ALBUTEROL SULFATE HFA 108 (90 BASE) MCG/ACT IN AERS: 1.0000 | INHALATION_SPRAY | Freq: Four times a day (QID) | RESPIRATORY_TRACT | Status: DC | PRN

## 2020-11-22 MED ORDER — MELATONIN 3 MG PO TABS
3.0000 mg | ORAL_TABLET | Freq: Every day | ORAL | Status: DC
  Administered 2020-11-22 – 2020-11-25 (×4): 3 mg via ORAL
  Filled 2020-11-22 (×4): qty 1

## 2020-11-22 MED ORDER — RISAQUAD PO CAPS
1.0000 | ORAL_CAPSULE | Freq: Every day | ORAL | Status: DC
  Administered 2020-11-23 – 2020-11-26 (×4): 1 via ORAL
  Filled 2020-11-22 (×4): qty 1

## 2020-11-22 MED ORDER — SENNOSIDES-DOCUSATE SODIUM 8.6-50 MG PO TABS
2.0000 | ORAL_TABLET | Freq: Three times a day (TID) | ORAL | Status: DC
  Administered 2020-11-22 – 2020-11-26 (×12): 2 via ORAL
  Filled 2020-11-22 (×12): qty 2

## 2020-11-22 MED ORDER — OXYCODONE HCL 5 MG PO TABS: 5.0000 mg | ORAL_TABLET | ORAL | Status: DC | PRN

## 2020-11-22 MED ORDER — LIDOCAINE 5 % EX PTCH
1.0000 | MEDICATED_PATCH | Freq: Every day | CUTANEOUS | Status: DC | PRN
  Filled 2020-11-22: qty 1

## 2020-11-22 MED ORDER — METHOCARBAMOL 500 MG PO TABS
500.0000 mg | ORAL_TABLET | Freq: Four times a day (QID) | ORAL | Status: DC
  Administered 2020-11-22 – 2020-11-26 (×12): 500 mg via ORAL
  Filled 2020-11-22 (×14): qty 1

## 2020-11-22 MED ORDER — CIPROFLOXACIN HCL 500 MG PO TABS
500.0000 mg | ORAL_TABLET | Freq: Two times a day (BID) | ORAL | Status: DC
  Administered 2020-11-22 – 2020-11-26 (×8): 500 mg via ORAL
  Filled 2020-11-22 (×8): qty 1

## 2020-11-22 MED ORDER — OXYCODONE-ACETAMINOPHEN 5-325 MG PO TABS
1.0000 | ORAL_TABLET | ORAL | Status: DC | PRN
  Filled 2020-11-22: qty 1

## 2020-11-22 MED ORDER — MORPHINE SULFATE (PF) 4 MG/ML IV SOLN: 4.0000 mg | Freq: Four times a day (QID) | INTRAVENOUS | Status: DC | PRN

## 2020-11-22 MED ORDER — HYDROMORPHONE HCL 1 MG/ML IJ SOLN
2.0000 mg | Freq: Once | INTRAMUSCULAR | Status: AC
  Administered 2020-11-22: 2 mg via INTRAVENOUS
  Filled 2020-11-22: qty 2

## 2020-11-22 MED ORDER — CLONAZEPAM 0.25 MG PO TBDP
0.2500 mg | ORAL_TABLET | Freq: Four times a day (QID) | ORAL | Status: DC
  Administered 2020-11-23 – 2020-11-26 (×15): 0.25 mg via ORAL
  Filled 2020-11-22 (×15): qty 1

## 2020-11-22 MED ORDER — MIRABEGRON ER 50 MG PO TB24
50.0000 mg | ORAL_TABLET | Freq: Every day | ORAL | Status: DC
  Administered 2020-11-23 – 2020-11-26 (×4): 50 mg via ORAL
  Filled 2020-11-22 (×4): qty 1

## 2020-11-22 MED ORDER — PROSOURCE PLUS PO LIQD
30.0000 mL | Freq: Two times a day (BID) | ORAL | Status: DC
  Administered 2020-11-26: 30 mL via ORAL
  Filled 2020-11-22 (×3): qty 30

## 2020-11-22 MED ORDER — FAMOTIDINE 20 MG PO TABS
20.0000 mg | ORAL_TABLET | Freq: Every day | ORAL | Status: DC
  Administered 2020-11-23 – 2020-11-26 (×4): 20 mg via ORAL
  Filled 2020-11-22 (×4): qty 1

## 2020-11-22 MED ORDER — GABAPENTIN 300 MG PO CAPS
300.0000 mg | ORAL_CAPSULE | Freq: Three times a day (TID) | ORAL | Status: DC
  Administered 2020-11-22 – 2020-11-26 (×12): 300 mg via ORAL
  Filled 2020-11-22 (×12): qty 1

## 2020-11-22 MED ORDER — OXYCODONE HCL 5 MG PO TABS
10.0000 mg | ORAL_TABLET | ORAL | Status: DC | PRN
Start: 2020-11-22 — End: 2020-11-27
  Administered 2020-11-22 – 2020-11-26 (×12): 10 mg via ORAL
  Filled 2020-11-22 (×12): qty 2

## 2020-11-22 MED ORDER — SODIUM CHLORIDE 0.9% FLUSH
3.0000 mL | Freq: Two times a day (BID) | INTRAVENOUS | Status: DC
  Administered 2020-11-22 – 2020-11-26 (×8): 3 mL via INTRAVENOUS

## 2020-11-22 MED ORDER — PROMETHAZINE HCL 12.5 MG PO TABS
12.5000 mg | ORAL_TABLET | Freq: Four times a day (QID) | ORAL | Status: DC
  Administered 2020-11-23 – 2020-11-26 (×14): 12.5 mg via ORAL
  Filled 2020-11-22 (×19): qty 1

## 2020-11-22 MED ORDER — HYDROMORPHONE HCL 1 MG/ML IJ SOLN
1.0000 mg | INTRAMUSCULAR | Status: DC | PRN
  Administered 2020-11-23 – 2020-11-26 (×14): 1 mg via INTRAVENOUS
  Filled 2020-11-22 (×15): qty 1

## 2020-11-22 MED ORDER — DRONABINOL 2.5 MG PO CAPS: 2.5000 mg | ORAL_CAPSULE | Freq: Two times a day (BID) | ORAL | Status: DC | PRN

## 2020-11-22 MED ORDER — ENOXAPARIN SODIUM 40 MG/0.4ML IJ SOSY
40.0000 mg | PREFILLED_SYRINGE | INTRAMUSCULAR | Status: DC
  Administered 2020-11-23 – 2020-11-25 (×3): 40 mg via SUBCUTANEOUS
  Filled 2020-11-22 (×4): qty 0.4

## 2020-11-22 MED ORDER — CLONAZEPAM 0.5 MG PO TABS: 0.2500 mg | ORAL_TABLET | Freq: Four times a day (QID) | ORAL | Status: DC

## 2020-11-22 MED ORDER — NALOXONE HCL 0.4 MG/ML IJ SOLN: 0.4000 mg | INTRAMUSCULAR | Status: DC | PRN

## 2020-11-22 MED ORDER — FENTANYL 75 MCG/HR TD PT72
1.0000 | MEDICATED_PATCH | TRANSDERMAL | Status: DC
  Administered 2020-11-24 – 2020-11-26 (×2): 1 via TRANSDERMAL
  Filled 2020-11-22 (×2): qty 1

## 2020-11-22 MED ORDER — PROBIOTIC COLON SUPPORT PO CAPS: 1.0000 | ORAL_CAPSULE | Freq: Every day | ORAL | Status: DC

## 2020-11-22 MED ORDER — TRAZODONE HCL 50 MG PO TABS
100.0000 mg | ORAL_TABLET | Freq: Every day | ORAL | Status: DC
  Administered 2020-11-22 – 2020-11-23 (×2): 200 mg via ORAL
  Filled 2020-11-22 (×2): qty 4

## 2020-11-22 MED ORDER — OXYBUTYNIN CHLORIDE 5 MG PO TABS
5.0000 mg | ORAL_TABLET | Freq: Three times a day (TID) | ORAL | Status: DC
  Administered 2020-11-23 – 2020-11-26 (×12): 5 mg via ORAL
  Filled 2020-11-22 (×14): qty 1

## 2020-11-22 MED ORDER — HYDROMORPHONE HCL 1 MG/ML IJ SOLN
2.0000 mg | Freq: Once | INTRAMUSCULAR | Status: AC
Start: 2020-11-22 — End: 2020-11-22
  Administered 2020-11-22: 2 mg via INTRAVENOUS
  Filled 2020-11-22: qty 2

## 2020-11-22 MED ORDER — NALOXEGOL OXALATE 12.5 MG PO TABS
12.5000 mg | ORAL_TABLET | Freq: Every day | ORAL | Status: DC
  Administered 2020-11-23 – 2020-11-25 (×4): 12.5 mg via ORAL
  Filled 2020-11-22 (×5): qty 1

## 2020-11-22 MED ORDER — LORAZEPAM 2 MG/ML IJ SOLN
0.5000 mg | Freq: Once | INTRAMUSCULAR | Status: AC
  Administered 2020-11-22: 0.5 mg via INTRAVENOUS
  Filled 2020-11-22: qty 1

## 2020-11-22 MED ORDER — VANCOMYCIN HCL 125 MG PO CAPS
125.0000 mg | ORAL_CAPSULE | Freq: Four times a day (QID) | ORAL | Status: DC
  Administered 2020-11-23 (×2): 125 mg via ORAL
  Filled 2020-11-22 (×5): qty 1

## 2020-11-22 MED ORDER — BUSPIRONE HCL 10 MG PO TABS
10.0000 mg | ORAL_TABLET | Freq: Three times a day (TID) | ORAL | Status: DC
  Administered 2020-11-22 – 2020-11-26 (×12): 10 mg via ORAL
  Filled 2020-11-22 (×12): qty 1

## 2020-11-22 MED ORDER — SODIUM CHLORIDE 0.9 % IV SOLN: 250.0000 mL | INTRAVENOUS | Status: DC | PRN

## 2020-11-22 MED ORDER — HYDROMORPHONE HCL 1 MG/ML IJ SOLN
1.0000 mg | Freq: Once | INTRAMUSCULAR | Status: AC
  Administered 2020-11-22: 1 mg via INTRAVENOUS
  Filled 2020-11-22: qty 1

## 2020-11-22 MED ORDER — HYDROMORPHONE HCL 1 MG/ML IJ SOLN: 1.0000 mg | INTRAMUSCULAR | Status: DC | PRN

## 2020-11-22 NOTE — ED Notes (Signed)
Pt calmer, feeling better, NAD, alert, interactive, resps e/u, speaking in clear complete sentences.

## 2020-11-22 NOTE — ED Notes (Signed)
Back from CT. No changes. CT and transfer made pain worse. 10/10.

## 2020-11-22 NOTE — ED Notes (Addendum)
Patient is crying not wanting to go to MRI due to pain. Patient is requesting something stronger.

## 2020-11-22 NOTE — H&P (Addendum)
History and Physical    Christopher [name] KDT:[id number] DOB: 26-Nov-1965 DOA: 11/22/2020  PCP: Vivi Barrack, MD Consultants:  ID: Dr. Elna Breslow,  Pain/PMR: Dr. Dagoberto Ligas, GI; Dr. Loletha Carrow Patient coming from:  Home - lives with wife  Chief Complaint: acute pain  HPI: Christopher [name] is a 55 y.o. male with medical history significant of chronic osteomyelitis in his left hip on antibiotics and followed by infectious disease, chronic pain syndrome, anxiety and depression, insomnia, GERD, chronic nausea, dysphagia followed by GI and colostomy bag. He presented to ER with sudden onset of chronic pain.  He woke up last night with excruciating pain in his left hip where his chronic osteomyelitis is located. His wife tells me he has been inconsolable. He denies any trauma or fall. No fever/chills, drainage or change to wound area. He has not missed any doses of his medication. He is on chronic pain medication including fentnyl patch, oxycodone, gabapentin, robaxin. Pain is 10/10 and sharp and constant. Dilaudid has only helped for 5 minutes and then it comes back. He states the pain can radiate through the entire back. No weakness, tingling or numbness down legs. No shortness of breath, cough, abdominal pain out of baseline.   ED Course: vitals: Blood pressure 151/89, heart rate 83, afebrile, respiratory rate 27 oxygen 99% on room air.  CT of abdomen and pelvis performed, labs showed no white count and CRP/ESR around baseline.  Asked to admit for pain control  Review of Systems: As per HPI; otherwise review of systems reviewed and negative.   Ambulatory Status:  Ambulates without assistance  COVID Vaccine Status:  2 vaccines in chart, he states he thinks he has been boosted as well.   Past Medical History:  Diagnosis Date   Acute on chronic respiratory failure with hypoxia ([place]) 06/2018   trach removed 11-16-2018, on vent from jan until may 2020 - uses albuterol prn   Anxiety    Bacteremia due to  Pseudomonas 06/2018   Chronic osteomyelitis (HCC)    Chronic pain syndrome    Clostridium difficile colitis 10/30/2019   tx with abx    Depression    DVT (deep venous thrombosis) ([place]) 2020   right brachial post PICC line   History of blood transfusion 06/2018   History of Clostridioides difficile colitis    History of kidney stones    Hypertension    norvasc d/c by pcp on 11/05/19   Multiple traumatic injuries    Penile pain 11/18/2019   Pneumonia 11/2009   2020 x 2   Walker as ambulation aid    Wheelchair bound    electric   Wound discharge    left hip wound with bloody/clear drainage change dressing q day surgilube with gauze, between legs wound using calcium algenate pad bid    Past Surgical History:  Procedure Laterality Date   APPLICATION OF A-CELL OF BACK N/A 08/06/2018   Procedure: Application Of A-Cell Of Back;  Surgeon: [name], DO;  Location: [place];  Service: Plastics;  Laterality: N/A;   APPLICATION OF A-CELL OF EXTREMITY Left 08/06/2018   Procedure: Application Of A-Cell Of Extremity;  Surgeon: [name], DO;  Location: [place];  Service: Plastics;  Laterality: Left;   APPLICATION OF A-CELL OF EXTREMITY Left 09/18/2019   Procedure: APPLICATION OF A-CELL OF EXTREMITY;  Surgeon: [name], DO;  Location: [place];  Service: Plastics;  Laterality: Left;   APPLICATION OF WOUND VAC  07/12/2018   Procedure:  Application Of Wound Vac to the Left Thigh and Scrotum.;  Surgeon: Shona Needles, MD;  Location: [place] [facility];  Service: Orthopedics;;   APPLICATION OF WOUND VAC  07/10/2018   Procedure: Application Of Wound Vac;  Surgeon: Clovis Riley, MD;  Location: [place];  Service: General;;   COLON SURGERY  2020   colostomy   COLOSTOMY N/A 07/23/2018   Procedure: COLOSTOMY;  Surgeon: Georganna Skeans, MD;  Location: [place];  Service: General;  Laterality: N/A;   CYSTOSCOPY W/ URETERAL STENT PLACEMENT N/A 07/15/2018   Procedure: RETROGRADE URETHROGRAM;  Surgeon:  Franchot Gallo, MD;  Location: [place];  Service: Urology;  Laterality: N/A;   CYSTOSCOPY WITH LITHOLAPAXY N/A 05/06/2019   Procedure: CYSTOSCOPY BASKET BLADDER STONE EXTRACTION;  Surgeon: Cleon Gustin, MD;  Location[facility];  Service: Urology;  Laterality: N/A;  30 MINS   CYSTOSTOMY N/A 05/06/2019   Procedure: REPLACEMENT OF SUPRAPUBIC CATHETER;  Surgeon: Cleon Gustin, MD;  Location: [facility];  Service: Urology;  Laterality: N/A;   DEBRIDEMENT AND CLOSURE WOUND Left 03/04/2019   Procedure: Excision of hip wound with placement of Acell;  Surgeon: [name], DO;  Location: [place];  Service: Plastics;  Laterality: Left;   ESOPHAGOGASTRODUODENOSCOPY N/A 08/14/2018   Procedure: ESOPHAGOGASTRODUODENOSCOPY (EGD);  Surgeon: Georganna Skeans, MD;  Location: [place];  Service: General;  Laterality: N/A;  bedside   FACIAL RECONSTRUCTION SURGERY     X 2--once as a teenager and second time in his 89's   HARDWARE REMOVAL Left 03/04/2019   Procedure: Left Hip Hardware Removal;  Surgeon: Shona Needles, MD;  Location: [place];  Service: Orthopedics;  Laterality: Left;   HIP PINNING,CANNULATED Left 07/12/2018   Procedure: CANNULATED HIP PINNING;  Surgeon: Shona Needles, MD;  Location: [place];  Service: Orthopedics;  Laterality: Left;   HIP SURGERY     HOLMIUM LASER APPLICATION Right 12/15/9161   Procedure: HOLMIUM LASER APPLICATION;  Surgeon: Cleon Gustin, MD;  Location: [facility];  Service: Urology;  Laterality: Right;   I & D EXTREMITY Left 07/25/2018   Procedure: Debridement of buttock, scrotum and left leg, placement of acell and vac;  Surgeon: [name], DO;  Location: [place];  Service: Plastics;  Laterality: Left;   I & D EXTREMITY N/A 08/06/2018   Procedure: Debridement of buttock, scrotum and left leg;  Surgeon: [name], DO;  Location: [place];  Service: Plastics;  Laterality: N/A;   I & D EXTREMITY  N/A 08/13/2018   Procedure: Debridement of buttock, scrotum and left leg, placement of acell and vac;  Surgeon: [name], DO;  Location: [place];  Service: Plastics;  Laterality: N/A;  90 min, please   INCISION AND DRAINAGE HIP Left 09/18/2019   Procedure: IRRIGATION AND DEBRIDEMENT HIP/ PELVIS WITH WOUND VAC PLACEMENT;  Surgeon: Shona Needles, MD;  Location: [place];  Service: Orthopedics;  Laterality: Left;   INCISION AND DRAINAGE OF WOUND N/A 07/18/2018   Procedure: Debridement of left leg, buttocks and scrotal wound with placement of acell and Flexiseal;  Surgeon: [name], DO;  Location: [place];  Service: Plastics;  Laterality: N/A;   INCISION AND DRAINAGE OF WOUND Left 08/29/2018   Procedure: Debridement of buttock, scrotum and left leg, placement of acell and vac;  Surgeon: [name], DO;  Location: [place];  Service: Plastics;  Laterality: Left;  75 min, please   INCISION AND DRAINAGE  OF WOUND Bilateral 10/23/2018   Procedure: DEBRIDEMENT OF BUTTOCK,SCROTUM, AND LEG WOUNDS WITH PLACEMENT OF ACELL- BILATERAL 90 MIN;  Surgeon: [name], DO;  Location: [place];  Service: Plastics;  Laterality: Bilateral;   IR ANGIOGRAM PELVIS SELECTIVE OR SUPRASELECTIVE  07/10/2018   IR ANGIOGRAM PELVIS SELECTIVE OR SUPRASELECTIVE  07/10/2018   IR ANGIOGRAM SELECTIVE EACH ADDITIONAL VESSEL  07/10/2018   IR EMBO ART  VEN HEMORR LYMPH EXTRAV  INC GUIDE ROADMAPPING  07/10/2018   IR NEPHROSTOMY PLACEMENT LEFT  04/05/2019   IR NEPHROSTOMY PLACEMENT RIGHT  05/31/2019   IR US GUIDE BX ASP/DRAIN  07/10/2018   IR US GUIDE VASC ACCESS RIGHT  07/10/2018   IR VENO/EXT/UNI LEFT  07/10/2018   IRRIGATION AND DEBRIDEMENT OF WOUND WITH SPLIT THICKNESS SKIN GRAFT Left 09/19/2018   Procedure: Debridement of gluteal wound with placement of acell to left leg.;  Surgeon: [name], DO;  Location: [place];  Service: Plastics;  Laterality: Left;  2.5 hours, please   LAPAROTOMY N/A 07/12/2018    Procedure: EXPLORATORY LAPAROTOMY;  Surgeon: Georganna Skeans, MD;  Location: [place];  Service: General;  Laterality: N/A;   LAPAROTOMY N/A 07/15/2018   Procedure: WOUND EXPLORATION; CLOSURE OF ABDOMEN;  Surgeon: Georganna Skeans, MD;  Location: [place];  Service: General;  Laterality: N/A;   LAPAROTOMY  07/10/2018   Procedure: Exploratory Laparotomy;  Surgeon: Clovis Riley, MD;  Location: [place];  Service: General;;   MASS EXCISION Left 09/18/2019   Procedure: EXCISION UPPER LEFT INNER THIGH WOUND;  Surgeon: [name], DO;  Location: [place];  Service: Plastics;  Laterality: Left;   NEPHROLITHOTOMY Right 07/15/2019   Procedure: NEPHROLITHOTOMY PERCUTANEOUS;  Surgeon: Cleon Gustin, MD;  Location: [facility];  Service: Urology;  Laterality: Right;  90 MINS   PEG PLACEMENT N/A 08/14/2018   Procedure: PERCUTANEOUS ENDOSCOPIC GASTROSTOMY (PEG) PLACEMENT;  Surgeon: Georganna Skeans, MD;  Location: [place];  Service: General;  Laterality: N/A;   PERCUTANEOUS TRACHEOSTOMY N/A 08/02/2018   Procedure: PERCUTANEOUS TRACHEOSTOMY;  Surgeon: Georganna Skeans, MD;  Location: [place];  Service: General;  Laterality: N/A;   RADIOLOGY WITH ANESTHESIA N/A 07/10/2018   Procedure: IR WITH ANESTHESIA;  Surgeon: Sandi Mariscal, MD;  Location: [place];  Service: Radiology;  Laterality: N/A;   RADIOLOGY WITH ANESTHESIA Right 07/10/2018   Procedure: Ir With Anesthesia;  Surgeon: Sandi Mariscal, MD;  Location: [place];  Service: Radiology;  Laterality: Right;   SCROTAL EXPLORATION N/A 07/15/2018   Procedure: SCROTUM DEBRIDEMENT;  Surgeon: Franchot Gallo, MD;  Location: [place];  Service: Urology;  Laterality: N/A;   SHOULDER SURGERY     SKIN SPLIT GRAFT Right 09/19/2018   Procedure: Skin Graft Split Thickness;  Surgeon: [name], DO;  Location: [place];  Service: Plastics;  Laterality: Right;   SKIN SPLIT GRAFT N/A 10/03/2018   Procedure: Split thickness skin graft to gluteal area with acell placement;   Surgeon: [name], DO;  Location: [place];  Service: Plastics;  Laterality: N/A;  3 hours, please   VACUUM ASSISTED CLOSURE CHANGE N/A 07/12/2018   Procedure: ABDOMINAL VACUUM ASSISTED CLOSURE CHANGE and abdominal washout;  Surgeon: Georganna Skeans, MD;  Location: [place];  Service: General;  Laterality: N/A;   WOUND DEBRIDEMENT Left 07/23/2018   Procedure: DEBRIDEMENT LEFT BUTTOCK  WOUND;  Surgeon: Georganna Skeans, MD;  Location: [place];  Service: General;  Laterality: Left;   WOUND EXPLORATION Left 07/10/2018   Procedure: WOUND EXPLORATION LEFT  GROIN;  Surgeon: Clovis Riley, MD;  Location: [facility];  Service: General;  Laterality: Left;    Social History   Socioeconomic History   Marital status: Married    Spouse name: Not on file   Number of children: Not on file   Years of education: Not on file   Highest education level: Not on file  Occupational History   Occupation: Disable  Tobacco Use   Smoking status: Former    Packs/day: 1.00    Years: 20.00    Pack years: 20.00    Types: Cigarettes    Quit date: 07/10/2018    Years since quitting: 2.3   Smokeless tobacco: Never  Vaping Use   Vaping Use: Never used  Substance and Sexual Activity   Alcohol use: Never   Drug use: Yes    Types: Oxycodone, Fentanyl    Comment: Fentanyl patch/oxycodone since 06/2018   Sexual activity: Yes  Other Topics Concern   Not on file  Social History Narrative   ** Merged History Encounter **       Social Determinants of Health   Financial Resource Strain: Not on file  Food Insecurity: Not on file  Transportation Needs: Not on file  Physical Activity: Not on file  Stress: Not on file  Social Connections: Not on file  Intimate Partner Violence: Not on file    Allergies  Allergen Reactions   Methadone Other (See Comments)    Hallucinations/confusion    Family History  Problem Relation Age of Onset   Breast cancer Mother        with mets to the bones    Prior to Admission  medications   Medication Sig Start Date End Date Taking? Authorizing Provider  albuterol (VENTOLIN HFA) 108 (90 Base) MCG/ACT inhaler INHALE 2 PUFFS BY MOUTH EVERY 6 HOURS AS NEEDED FOR WHEEZE OR SHORTNESS OF BREATH (WAITING ON PA) Patient taking differently: Inhale 1 puff into the lungs every 6 (six) hours as needed for shortness of breath. 08/31/20   Vivi Barrack, MD  Amino Acids-Protein Hydrolys (FEEDING SUPPLEMENT, PRO-STAT 64,) LIQD Take 30 mLs by mouth 3 (three) times daily with meals. 07/02/20   Vivi Barrack, MD  Blood Pressure Monitoring (BLOOD PRESSURE CUFF) MISC Use daily as needed to check blood pressure. 11/05/19   Vivi Barrack, MD  busPIRone (BUSPAR) 5 MG tablet Take 1 tablet (5 mg total) by mouth 3 (three) times daily. X 1 week, then if tolerated, increase to 10 mg 3x/day- for anxiety 11/04/20   Lovorn, Jinny Blossom, MD  ciprofloxacin (CIPRO) 500 MG tablet Take 1 tablet (500 mg total) by mouth 2 (two) times daily. 04/20/20   Golden Circle, FNP  clonazePAM (KLONOPIN) 0.5 MG tablet Take 0.5 tablets (0.25 mg total) by mouth every 6 (six) hours as needed for anxiety. 09/02/20   Lovorn, Jinny Blossom, MD  dextromethorphan-guaiFENesin (MUCINEX DM) 30-600 MG 12hr tablet Take 2 tablets by mouth 2 (two) times daily.    [provider]  dronabinol (MARINOL) 2.5 MG capsule TAKE 1 CAPSULE (2.5 MG TOTAL) BY MOUTH 2 (TWO) TIMES DAILY BEFORE A MEAL. Patient taking differently: Take 2.5 mg by mouth 2 (two) times daily as needed (to encourage appetite). 07/29/19   Lovorn, Jinny Blossom, MD  famotidine (PEPCID) 20 MG tablet Take 20 mg by mouth daily.    [provider]  fentaNYL (DURAGESIC) 75 MCG/HR Place 1 patch onto the skin every other day. 10/20/20   Lovorn, Jinny Blossom, MD  gabapentin (NEURONTIN)  300 MG capsule Take 1 capsule (300 mg total) by mouth 3 (three) times daily. 05/25/20   Lovorn, Jinny Blossom, MD  lidocaine (LIDODERM) 5 % PLACE 3 PATCHES ONTO THE SKIN EVERY 12 HOURS. REMOVE & DISCARD PATCH WITHIN 12  HOURS OR AS DIRECTED BY MD Patient taking differently: Place 1 patch onto the skin daily. 06/16/20   Lovorn, Jinny Blossom, MD  melatonin ([employer] MELATONIN) 3 MG TABS tablet Take 1 tablet (3 mg total) by mouth at bedtime. 11/01/19   Matcha, Beverely Pace, MD  methocarbamol (ROBAXIN) 500 MG tablet TAKE 1 TABLET BY MOUTH EVERY 6 HOURS AS NEEDED FOR MUSCLE SPASMS Patient taking differently: Take 500 mg by mouth every 6 (six) hours as needed for muscle spasms. 11/06/19   Lovorn, Jinny Blossom, MD  mirabegron ER (MYRBETRIQ) 50 MG TB24 tablet Take 50 mg by mouth daily.    [provider]  Multiple Vitamin (MULTIVITAMIN WITH MINERALS) TABS tablet Take 1 tablet by mouth daily. 03/13/19   Kinnie Feil, MD  naloxegol oxalate (MOVANTIK) 12.5 MG TABS tablet Take 1 tablet (12.5 mg total) by mouth daily. Patient taking differently: Take 12.5 mg by mouth every evening. 07/14/20   Golden Circle, FNP  naloxone [facility]) nasal spray 4 mg/0.1 mL To use if pt develops unconsciousness or confusion that family thinks is related to opioids. Patient taking differently: Place 1 spray into the nose daily as needed ("To [place] if pt develops unconsciousness or confusion that family thinks is related to opiods"). 12/30/19   Lovorn, Jinny Blossom, MD  oxybutynin (DITROPAN) 5 MG tablet Take 5 mg by mouth See admin instructions. Take 5 mg by mouth three times a day and an additional 5 mg once daily as needed for urinary urgency 11/13/19   [provider]  oxyCODONE-acetaminophen (PERCOCET) 10-325 MG tablet Take 1 tablet by mouth every 3 (three) hours as needed (breakthrough pain). 10/20/20   Lovorn, Jinny Blossom, MD  PARoxetine (PAXIL) 40 MG tablet TAKE 1 TABLET BY MOUTH EVERYDAY AT BEDTIME - PA IN PROGRESS Patient taking differently: Take 40 mg by mouth at bedtime. 03/30/20   Lovorn, Jinny Blossom, MD  Probiotic Product (PROBIOTIC COLON SUPPORT) CAPS Take 1 capsule by mouth daily.    [provider]  promethazine (PHENERGAN) 12.5 MG tablet TAKE 1 TABLET  (12.5 MG TOTAL) BY MOUTH EVERY 6 (SIX) HOURS AS NEEDED FOR NAUSEA, VOMITING OR REFRACTORY NAUSEA / VOMITING. 10/12/20   Lovorn, Jinny Blossom, MD  senna-docusate (SENOKOT-S) 8.6-50 MG tablet Take 2 tablets by mouth in the morning, at noon, in the evening, and at bedtime. Takes 2 tablets by mouth in the morning and 2 tablet by mouth at night. 04/13/20   [provider]  traZODone (DESYREL) 100 MG tablet TAKE 1-2 TABLETS (100-200 MG TOTAL) BY MOUTH AT BEDTIME. Patient taking differently: Take 200 mg by mouth at bedtime. 07/06/20   Lovorn, Jinny Blossom, MD  vancomycin (VANCOCIN) 125 MG capsule Take 1 capsule (125 mg total) by mouth daily. 11/06/20   Golden Circle, FNP    Physical Exam: Vitals:   11/22/20 1600 11/22/20 1615 11/22/20 1645 11/22/20 1700  BP: (!) 130/97 133/84 (!) 154/87 (!) 151/89  Pulse: 87 93 84 83  Resp: (!) 32 17 (!) 30 (!) 27  Temp:      TempSrc:      SpO2: 99% 99% 100% 99%     General:  In distress from pain. Tearful at times.  Eyes:  PERRL, EOMI, normal lids, iris ENT:  grossly normal hearing, lips & tongue, mmm;  poor dentition.  Neck:  no LAD, masses or thyromegaly; no carotid bruits Cardiovascular:  RRR, no m/r/g. No LE edema.  Respiratory:   CTA bilaterally with no wheezes/rales/rhonchi.  Breathing hard due to pain and anxiety  Abdomen:  soft, NT, ND, NABS. Colostomy bag clean, no surrounding erythema.  Back:   TTP along lower back/left SI joint. Will not let me palpate.  Skin:  no rash or induration seen on limited exam. Refused to let me look at wound.  Musculoskeletal:  grossly normal tone BUE/BLE, good ROM Lower extremity:  No LE edema.  Limited foot exam with no ulcerations.  2+ distal pulses. Psychiatric:  grossly normal mood and affect, speech fluent and appropriate, AOx3 Neurologic:  CN 2-12 grossly intact, moves all extremities in coordinated fashion, sensation intact    Radiological Exams on Admission: Independently reviewed - see discussion in A/P where  applicable  CT ABDOMEN PELVIS W CONTRAST  Result Date: 11/22/2020 CLINICAL DATA:  Decubitus ulcer.  Severe pain. EXAM: CT ABDOMEN AND PELVIS WITH CONTRAST TECHNIQUE: Multidetector CT imaging of the abdomen and pelvis was performed using the standard protocol following bolus administration of intravenous contrast. CONTRAST:  42m OMNIPAQUE IOHEXOL 300 MG/ML  SOLN COMPARISON:  10/14/2020 FINDINGS: Lower chest: Persistent small left pleural effusion and bibasilar atelectasis. The heart is normal in size. No pericardial effusion. Hepatobiliary: No hepatic lesions are identified. The gallbladder is unremarkable. Mild common bile duct dilatation appears relatively stable. Maximum diameter of the common bile duct in the head of the pancreas is 8 mm but it tapers normally toward the ampulla. Stable mild intrahepatic biliary dilatation also. Pancreas: Moderate pancreatic atrophy but no mass or acute inflammation. Spleen: Normal size.  No focal lesions. Adrenals/Urinary Tract: Stable left adrenal gland nodule consistent with a benign adenoma. The left adrenal gland is normal. Both kidneys are unremarkable. No renal lesions, hydronephrosis or findings to suggest pyelonephritis. No renal calculi but there is a 2 mm distal right ureteral calculus but no hydroureter. There are stable bladder calculi. Patient has a suprapubic catheter in place. Mild uniform bladder wall thickening. No bladder mass. Stomach/Bowel: The stomach, duodenum, small bowel and colon are grossly normal. No acute inflammatory changes, mass lesions or obstructive findings. The terminal ileum is normal. The appendix is normal. 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 Vascular/Lymphatic: Stable vascular calcifications but no aneurysm or dissection. The major venous structures are patent. Reproductive: The prostate gland and seminal vesicles are unremarkable. Other: No free pelvic fluid collections or pelvic abscess. Musculoskeletal: Stable  remote posttraumatic and postsurgical changes involving the bony pelvis. There is a deep decubitus type wound involving the upper left iliac bone with some type of wound packing noted. This extends right down into the widened and irregular left SI joint. 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 Do not see any significant change when compared to the recent prior CT scan. Evidence of remote removed hardware crossing the right SI joint which is mildly widened. Left hip hardware is noted but I do not see any findings suspicious for septic arthritis or osteomyelitis involving the hips. IMPRESSION: 1. Stable remote posttraumatic and postsurgical changes involving the bony pelvis. 2. Deep soft tissue wound involving the upper left buttock area with some type of wound packing noted. This extends right down into the widened and irregular left SI joint. 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 3.  No findings suspicious for septic arthritis or osteomyelitis involving the hips. 4. Suprapubic catheter in place with mild uniform bladder wall thickening. 5. Stable bladder calculi. 6. 2 mm nonobstructing distal right ureteral calculus. 7. Stable small left pleural effusion and bibasilar atelectasis. 8. Stable left adrenal gland adenoma. 9. 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 Electronically Signed   By: Marijo Sanes M.D.   On: 11/22/2020 15:16    EKG: Independently reviewed.  Sinus tachy with rate 106; nonspecific ST changes with no evidence of acute ischemia. Prolonged qt. Similar to previous tracings.    Labs on Admission: I have personally reviewed the available labs and imaging studies at the time of the admission.  Pertinent labs:  Esr: 62 and CRP: 5.4 hgb: 11.3 ALT: 46 and alk phos: 161   Assessment/Plan Principal Problem:    Chronic pain syndrome -acute on chronic and in distress due to the pain -no acute source of pain, MRI has been ordered.  -dilaudid not helping -has not missed any of his medication at home.  -called pharmacy since on so many pain medications. Not a lot of options. Suggested methadone, but he has allergy. Can not do suboxone due to prolonged QT.  -we will increase oxycodone to 29m q3 hour prn and have dilaudid 152mq 4 hours prn severe pain.  -refusing home oxycodone: had numerous discussions.  -pmpwebsite verified and fills appropriately.  -continuous pulse ox/telemetry due to pain control.  -narcan prn if needed.  -check CK/UDS   -Ct abdo/pelvis with no obvious finding for pain. MR pelvis has been ordered.   Home pain meds: fentynl patch 7571mhr, oxycodone, gabapentin, robaxin all continued. Followed by PMR for pain on outpatient basis.   Could increase fentynl patch, but delayed pain response.   Active Problems:   Chronic osteomyelitis of hip (HCCPaintcontinued outpatient abx of cipro and vancocin. No findings today to suggest change in infection, MRI pending.  -ID consulted and discussed. They will see in the AM. -MRI pelvis ordered to further investigate source of pain and if osteo source of acute pain.     Anxiety and depression -continue all of his home medications. Refused in ER and I went and discussed with him that he can not just stop these, especially with his anxiety so high because of his pain.  -buspar, klonopin, paxil,      Colostomy status (HCCKing Georgecolostomy care ordered     Insomnia Continue home trazodone and melatonin     GERD (gastroesophageal reflux disease) continue Pepcid    Suprapubic catheter ([place]-Catheter care   Elevated alk phos -checking vitamin D, repeat in AM  Anemia -stable.   There is no height or weight on file to calculate BMI.     Level of care: Med-Surg DVT prophylaxis:  Lovenox  Code Status:  Full - confirmed with  patient Family Communication: spoke with his wife, melanie Cassity on the phone. [phone] Plan:  The patient is from: home    Anticipated d/c date will depend on clinical response to treatment, but possibly as early as tomorrow if he has excellent response to treatment, but guarded given his current presentation.    Consults called: Infectious disease: Dr. KeeDietrich Patesm  Admission status:  inpatient    AllOrma Flaming [facility]   How to contact the [facility] or Consulting provider 7A [place] covering provider during after hours 7P [place] this patient?  Check the care team in  [facility] and look for a) attending/consulting [facility] provider listed and b) the [place] team listed Log into [email or web address] and use [facility] universal password to access. If you do not have the password, please contact the hospital operator. Locate the [facility] provider you are looking for under [facility] and page to a number that you can be directly reached. If you still have difficulty reaching the provider, please page the [facility] (Director on Call) for the Hospitalists listed on amion for assistance.   11/22/2020, 8:49 PM

## 2020-11-22 NOTE — ED Notes (Signed)
This RN called MRI and made them aware that Report has been called and that the patient is to be transported to [facility] after scans, this RN also made MRI aware that Jasmine December, RN is the primary RN and is aware that the patient is to be transported from [facility].

## 2020-11-22 NOTE — Progress Notes (Signed)
[facility] called, patient stated that he is unable to do procedure because of pain.  Patient stated that he can not lay on his back for 45 minutes and would like to try another time.
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
## 2020-11-22 NOTE — ED Notes (Signed)
EDP into see. Pain meds ordered and given.

## 2020-11-22 NOTE — ED Notes (Signed)
Patient transported to [facility] 

## 2020-11-22 NOTE — ED Notes (Addendum)
Patient refusing medications that are ordered, states "I need something stronger to knock me out please" Patient is crying. Artis Flock, MD has been notified.

## 2020-11-22 NOTE — ED Provider Notes (Signed)
[facility] EMERGENCY DEPARTMENT Provider Note   CSN: [id number] Arrival date & time: 11/22/20  1222     History Chief Complaint  Patient presents with   Back Pain    Back hip abd chronic pain 2020 injury    Christopher [name] is a 55 y.o. male.   Back Pain Associated symptoms: abdominal pain and weakness   Associated symptoms: no chest pain   Patient has a history of chronic osteomyelitis of his left hip.  He has chronic pain with that is on chronic oxycodone.  Is also on suppressing antibiotics.  Patient states the pain became much worse when he woke up at 5:00 this morning.  No trauma.  No fevers or chills.  Pain is in his left buttock/hip area.  Also some radiation abdomen.  Similar pain but is had prior.  Per patient no recent change to his wounds.  Patient states he feels if it is more swollen above his left flank.    Past Medical History:  Diagnosis Date   Acute on chronic respiratory failure with hypoxia (HCC) 06/2018   trach removed 11-16-2018, on vent from jan until may 2020 - uses albuterol prn   Anxiety    Bacteremia due to Pseudomonas 06/2018   Chronic osteomyelitis (HCC)    Chronic pain syndrome    Clostridium difficile colitis 10/30/2019   tx with abx    Depression    DVT (deep venous thrombosis) (HCC) 2020   right brachial post PICC line   History of blood transfusion 06/2018   History of Clostridioides difficile colitis    History of kidney stones    Hypertension    norvasc d/c by pcp on 11/05/19   Multiple traumatic injuries    Penile pain 11/18/2019   Pneumonia 11/2009   2020 x 2   Walker as ambulation aid    Wheelchair bound    electric   Wound discharge    left hip wound with bloody/clear drainage change dressing q day surgilube with gauze, between legs wound using calcium algenate pad bid    Patient Active Problem List   Diagnosis Date Noted   Multifocal pneumonia 10/14/2020   Abnormal urinalysis 10/14/2020   Wheelchair  dependence 07/22/2020   Respiratory failure with hypoxia (HCC) 07/02/2020   Dysphagia 07/02/2020   Sepsis (HCC) 06/20/2020   Therapeutic opioid induced constipation 04/11/2020   Status post peripherally inserted central catheter (PICC) central line placement 03/17/2020   Skin-picking disorder 03/12/2020   CHF (congestive heart failure) (HCC) 01/27/2020   Bone infection of pelvis (HCC) 12/12/2019   Recurrent pain of right knee 11/08/2019   Elevated blood pressure reading 11/05/2019   Acute osteomyelitis of pelvic region and thigh (HCC)    Acute deep vein thrombosis (DVT) of brachial vein of right upper extremity (HCC) 11/01/2019   Myofascial pain dysfunction syndrome 08/05/2019   Protein-calorie malnutrition, severe 06/01/2019   Complicated urinary tract infection 05/30/2019   Chronic osteomyelitis of pelvis, left (HCC) 03/07/2019   Wound infection, posttraumatic 02/28/2019   Hydronephrosis concurrent with and due to calculi of kidney and ureter 02/28/2019   GERD (gastroesophageal reflux disease) 02/28/2019   Suprapubic catheter (HCC) 02/28/2019   Protein-calorie malnutrition, moderate (HCC) 02/28/2019   Anxiety 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 01/29/2019   Reactive depression 01/29/2019   Chronic pain due to trauma 01/29/2019   Neuropathic pain 01/29/2019   Colostomy status (HCC) 01/14/2019   Insomnia 01/14/2019   Tachycardia 01/14/2019   Current use of  proton pump inhibitor 01/14/2019   Wound of buttock 12/28/2018   Anxiety and depression 12/17/2018   Debility 11/23/2018   Acute on chronic respiratory failure with hypoxia (HCC)    Chronic pain syndrome    Multiple traumatic injuries    Pressure injury of skin 08/13/2018   Urethral injury 07/13/2018    Past Surgical History:  Procedure Laterality Date   APPLICATION OF A-CELL OF BACK N/A 08/06/2018   Procedure: Application Of A-Cell Of Back;  Surgeon: Peggye Form, DO;  Location: [facility] OR;  Service:  Plastics;  Laterality: N/A;   APPLICATION OF A-CELL OF EXTREMITY Left 08/06/2018   Procedure: Application Of A-Cell Of Extremity;  Surgeon: Peggye Form, DO;  Location: [facility] OR;  Service: Plastics;  Laterality: Left;   APPLICATION OF A-CELL OF EXTREMITY Left 09/18/2019   Procedure: APPLICATION OF A-CELL OF EXTREMITY;  Surgeon: Peggye Form, DO;  Location: [facility] OR;  Service: Plastics;  Laterality: Left;   APPLICATION OF WOUND VAC  07/12/2018   Procedure: Application Of Wound Vac to the Left Thigh and Scrotum.;  Surgeon: Roby Lofts, MD;  Location: [facility] OR;  Service: Orthopedics;;   APPLICATION OF WOUND VAC  07/10/2018   Procedure: Application Of Wound Vac;  Surgeon: Berna Bue, MD;  Location: [facility] OR;  Service: General;;   COLON SURGERY  2020   colostomy   COLOSTOMY N/A 07/23/2018   Procedure: COLOSTOMY;  Surgeon: Violeta Gelinas, MD;  Location: [facility] OR;  Service: General;  Laterality: N/A;   CYSTOSCOPY W/ URETERAL STENT PLACEMENT N/A 07/15/2018   Procedure: RETROGRADE URETHROGRAM;  Surgeon: Marcine Matar, MD;  Location: [facility] OR;  Service: Urology;  Laterality: N/A;   CYSTOSCOPY WITH LITHOLAPAXY N/A 05/06/2019   Procedure: CYSTOSCOPY BASKET BLADDER STONE EXTRACTION;  Surgeon: Malen Gauze, MD;  Location: [facility];  Service: Urology;  Laterality: N/A;  30 MINS   CYSTOSTOMY N/A 05/06/2019   Procedure: REPLACEMENT OF SUPRAPUBIC CATHETER;  Surgeon: Malen Gauze, MD;  Location: [facility];  Service: Urology;  Laterality: N/A;   DEBRIDEMENT AND CLOSURE WOUND Left 03/04/2019   Procedure: Excision of hip wound with placement of Acell;  Surgeon: Peggye Form, DO;  Location: [facility];  Service: Plastics;  Laterality: Left;   ESOPHAGOGASTRODUODENOSCOPY N/A 08/14/2018   Procedure: ESOPHAGOGASTRODUODENOSCOPY (EGD);  Surgeon: Violeta Gelinas, MD;  Location: [facility] ENDOSCOPY;  Service: General;  Laterality: N/A;  bedside   FACIAL RECONSTRUCTION SURGERY      X 2--once as a teenager and second time in his 24's   HARDWARE REMOVAL Left 03/04/2019   Procedure: Left Hip Hardware Removal;  Surgeon: Roby Lofts, MD;  Location: [facility] OR;  Service: Orthopedics;  Laterality: Left;   HIP PINNING,CANNULATED Left 07/12/2018   Procedure: CANNULATED HIP PINNING;  Surgeon: Roby Lofts, MD;  Location: [facility];  Service: Orthopedics;  Laterality: Left;   HIP SURGERY     HOLMIUM LASER APPLICATION Right 07/15/2019   Procedure: HOLMIUM LASER APPLICATION;  Surgeon: Malen Gauze, MD;  Location: [facility];  Service: Urology;  Laterality: Right;   I & D EXTREMITY Left 07/25/2018   Procedure: Debridement of buttock, scrotum and left leg, placement of acell and vac;  Surgeon: Peggye Form, DO;  [facility]: [facility];  Service: Plastics;  Laterality: Left;   I & D EXTREMITY N/A 08/06/2018   Procedure: Debridement of buttock, scrotum and left leg;  Surgeon: Peggye Form, DO;  Location: [facility];  Service: Government social research officer;  Laterality: N/A;   I & D EXTREMITY N/A 08/13/2018   Procedure: Debridement of buttock, scrotum and left leg, placement of acell and vac;  Surgeon: Peggye Form, DO;  [facility]: [facility];  Service: Plastics;  Laterality: N/A;  90 min, please   INCISION AND DRAINAGE HIP Left 09/18/2019   Procedure: IRRIGATION AND DEBRIDEMENT HIP/ PELVIS WITH WOUND VAC PLACEMENT;  Surgeon: Roby Lofts, MD;  Location: [facility];  Service: Orthopedics;  Laterality: Left;   INCISION AND DRAINAGE OF WOUND N/A 07/18/2018   Procedure: Debridement of left leg, buttocks and scrotal wound with placement of acell and Flexiseal;  Surgeon: Peggye Form, DO;  [facility]: [facility];  Service: Plastics;  Laterality: N/A;   INCISION AND DRAINAGE OF WOUND Left 08/29/2018   Procedure: Debridement of buttock, scrotum and left leg, placement of acell and vac;  Surgeon: Peggye Form, DO;  Location: [facility];  Service: Plastics;  Laterality: Left;  75 min, please    INCISION AND DRAINAGE OF WOUND Bilateral 10/23/2018   Procedure: DEBRIDEMENT OF BUTTOCK,SCROTUM, AND LEG WOUNDS WITH PLACEMENT OF ACELL- BILATERAL 90 MIN;  Surgeon: Peggye Form, DO;  Location: [facility];  Service: Plastics;  Laterality: Bilateral;   IR ANGIOGRAM PELVIS SELECTIVE OR SUPRASELECTIVE  07/10/2018   IR ANGIOGRAM PELVIS SELECTIVE OR SUPRASELECTIVE  07/10/2018   IR ANGIOGRAM SELECTIVE EACH ADDITIONAL VESSEL  07/10/2018   IR EMBO ART  VEN HEMORR LYMPH EXTRAV  INC GUIDE ROADMAPPING  07/10/2018   IR NEPHROSTOMY PLACEMENT LEFT  04/05/2019   IR NEPHROSTOMY PLACEMENT RIGHT  05/31/2019   IR US GUIDE BX ASP/DRAIN  07/10/2018   IR US GUIDE VASC ACCESS RIGHT  07/10/2018   IR VENO/EXT/UNI LEFT  07/10/2018   IRRIGATION AND DEBRIDEMENT OF WOUND WITH SPLIT THICKNESS SKIN GRAFT Left 09/19/2018   Procedure: Debridement of gluteal wound with placement of acell to left leg.;  Surgeon: Peggye Form, DO;  [facility]: [facility];  Service: Plastics;  Laterality: Left;  2.5 hours, please   LAPAROTOMY N/A 07/12/2018   Procedure: EXPLORATORY LAPAROTOMY;  Surgeon: Violeta Gelinas, MD;  Location: [facility] OR;  Service: General;  Laterality: N/A;   LAPAROTOMY N/A 07/15/2018   Procedure: WOUND EXPLORATION; CLOSURE OF ABDOMEN;  Surgeon: Violeta Gelinas, MD;  Location: [facility] OR;  Service: General;  Laterality: N/A;   LAPAROTOMY  07/10/2018   Procedure: Exploratory Laparotomy;  Surgeon: Berna Bue, MD;  Location: [facility] OR;  Service: General;;   MASS EXCISION Left 09/18/2019   Procedure: EXCISION UPPER LEFT INNER THIGH WOUND;  Surgeon: Peggye Form, DO;  [facility];  Service: Plastics;  Laterality: Left;   NEPHROLITHOTOMY Right 07/15/2019   Procedure: NEPHROLITHOTOMY PERCUTANEOUS;  Surgeon: Malen Gauze, MD;  Location: [facility];  Service: Urology;  Laterality: Right;  90 MINS   PEG PLACEMENT N/A 08/14/2018   Procedure: PERCUTANEOUS ENDOSCOPIC GASTROSTOMY (PEG) PLACEMENT;  Surgeon: Violeta Gelinas, MD;  Location: [facility] ENDOSCOPY;  Service: General;  Laterality: N/A;   PERCUTANEOUS TRACHEOSTOMY N/A 08/02/2018   Procedure: PERCUTANEOUS TRACHEOSTOMY;  Surgeon: Violeta Gelinas, MD;  Location: [facility] OR;  Service: General;  Laterality: N/A;   RADIOLOGY WITH ANESTHESIA N/A 07/10/2018   Procedure: IR WITH ANESTHESIA;  Surgeon: Simonne Come, MD;  Location: [facility] OR;  Service: Radiology;  Laterality: N/A;   RADIOLOGY WITH ANESTHESIA Right 07/10/2018   Procedure: Ir With Anesthesia;  Surgeon: Simonne Come, MD;  Location: [facility] OR;  Service: Radiology;  Laterality: Right;  SCROTAL EXPLORATION N/A 07/15/2018   Procedure: SCROTUM DEBRIDEMENT;  Surgeon: Marcine Matar, MD;  Location: [facility] OR;  Service: Urology;  Laterality: N/A;   SHOULDER SURGERY     SKIN SPLIT GRAFT Right 09/19/2018   Procedure: Skin Graft Split Thickness;  Surgeon: Peggye Form, DO;  [facility];  Service: Plastics;  Laterality: Right;   SKIN SPLIT GRAFT N/A 10/03/2018   Procedure: Split thickness skin graft to gluteal area with acell placement;  Surgeon: Peggye Form, DO;  Location: [facility] OR;  Service: Plastics;  Laterality: N/A;  3 hours, please   VACUUM ASSISTED CLOSURE CHANGE N/A 07/12/2018   Procedure: ABDOMINAL VACUUM ASSISTED CLOSURE CHANGE and abdominal washout;  Surgeon: Violeta Gelinas, MD;  Location: [facility] OR;  Service: General;  Laterality: N/A;   WOUND DEBRIDEMENT Left 07/23/2018   Procedure: DEBRIDEMENT LEFT BUTTOCK  WOUND;  Surgeon: Violeta Gelinas, MD;  Location: [facility] OR;  Service: General;  Laterality: Left;   WOUND EXPLORATION Left 07/10/2018   Procedure: WOUND EXPLORATION LEFT GROIN;  Surgeon: Berna Bue, MD;  Location: [facility] OR;  Service: General;  Laterality: Left;       Family History  Problem Relation Age of Onset   Breast cancer Mother        with mets to the bones    Social History   Tobacco Use   Smoking status: Former    Packs/day: 1.00    Years: 20.00    Pack years: 20.00    Types:  Cigarettes    Quit date: 07/10/2018    Years since quitting: 2.3   Smokeless tobacco: Never  Vaping Use   Vaping Use: Never used  Substance Use Topics   Alcohol use: Never   Drug use: Yes    Types: Oxycodone, Fentanyl    Comment: Fentanyl patch/oxycodone since 06/2018    Home Medications Prior to Admission medications   Medication Sig Start Date End Date Taking? Authorizing Provider  albuterol (VENTOLIN HFA) 108 (90 Base) MCG/ACT inhaler INHALE 2 PUFFS BY MOUTH EVERY 6 HOURS AS NEEDED FOR WHEEZE OR SHORTNESS OF BREATH (WAITING ON PA) Patient taking differently: Inhale 1 puff into the lungs every 6 (six) hours as needed for shortness of breath. 08/31/20   Ardith Dark, MD  Amino Acids-Protein Hydrolys (FEEDING SUPPLEMENT, PRO-STAT 64,) LIQD Take 30 mLs by mouth 3 (three) times daily with meals. 07/02/20   Ardith Dark, MD  Blood Pressure Monitoring (BLOOD PRESSURE CUFF) MISC Use daily as needed to check blood pressure. 11/05/19   Ardith Dark, MD  busPIRone (BUSPAR) 5 MG tablet Take 1 tablet (5 mg total) by mouth 3 (three) times daily. X 1 week, then if tolerated, increase to 10 mg 3x/day- for anxiety 11/04/20   Lovorn, Aundra Millet, MD  ciprofloxacin (CIPRO) 500 MG tablet Take 1 tablet (500 mg total) by mouth 2 (two) times daily. 04/20/20   Veryl Speak, FNP  clonazePAM (KLONOPIN) 0.5 MG tablet Take 0.5 tablets (0.25 mg total) by mouth every 6 (six) hours as needed for anxiety. 09/02/20   Lovorn, Aundra Millet, MD  dextromethorphan-guaiFENesin (MUCINEX DM) 30-600 MG 12hr tablet Take 2 tablets by mouth 2 (two) times daily.    [provider]  dronabinol (MARINOL) 2.5 MG capsule TAKE 1 CAPSULE (2.5 MG TOTAL) BY MOUTH 2 (TWO) TIMES DAILY BEFORE A MEAL. Patient taking differently: Take 2.5 mg by mouth 2 (two) times daily as needed (to encourage appetite). 07/29/19   Lovorn, Aundra Millet, MD  famotidine (PEPCID) 20 MG  tablet Take 20 mg by mouth daily.    [provider]  fentaNYL (DURAGESIC)  75 MCG/HR Place 1 patch onto the skin every other day. 10/20/20   Lovorn, Aundra Millet, MD  gabapentin (NEURONTIN) 300 MG capsule Take 1 capsule (300 mg total) by mouth 3 (three) times daily. 05/25/20   Lovorn, Aundra Millet, MD  lidocaine (LIDODERM) 5 % PLACE 3 PATCHES ONTO THE SKIN EVERY 12 HOURS. REMOVE & DISCARD PATCH WITHIN 12 HOURS OR AS DIRECTED BY MD Patient taking differently: Place 1 patch onto the skin daily. 06/16/20   Lovorn, Aundra Millet, MD  melatonin ([employer] MELATONIN) 3 MG TABS tablet Take 1 tablet (3 mg total) by mouth at bedtime. 11/01/19   Matcha, Darrold Junker, MD  methocarbamol (ROBAXIN) 500 MG tablet TAKE 1 TABLET BY MOUTH EVERY 6 HOURS AS NEEDED FOR MUSCLE SPASMS Patient taking differently: Take 500 mg by mouth every 6 (six) hours as needed for muscle spasms. 11/06/19   Lovorn, Aundra Millet, MD  mirabegron ER (MYRBETRIQ) 50 MG TB24 tablet Take 50 mg by mouth daily.    [provider]  Multiple Vitamin (MULTIVITAMIN WITH MINERALS) TABS tablet Take 1 tablet by mouth daily. 03/13/19   Esperanza Sheets, MD  naloxegol oxalate (MOVANTIK) 12.5 MG TABS tablet Take 1 tablet (12.5 mg total) by mouth daily. Patient taking differently: Take 12.5 mg by mouth every evening. 07/14/20   Veryl Speak, FNP  naloxone [facility]) nasal spray 4 mg/0.1 mL To use if pt develops unconsciousness or confusion that family thinks is related to opioids. Patient taking differently: Place 1 spray into the nose daily as needed ("To [place] if pt develops unconsciousness or confusion that family thinks is related to opiods"). 12/30/19   Lovorn, Aundra Millet, MD  oxybutynin (DITROPAN) 5 MG tablet Take 5 mg by mouth See admin instructions. Take 5 mg by mouth three times a day and an additional 5 mg once daily as needed for urinary urgency 11/13/19   [provider]  oxyCODONE-acetaminophen (PERCOCET) 10-325 MG tablet Take 1 tablet by mouth every 3 (three) hours as needed (breakthrough pain). 10/20/20   Lovorn, Aundra Millet, MD  PARoxetine (PAXIL) 40 MG tablet  TAKE 1 TABLET BY MOUTH EVERYDAY AT BEDTIME - PA IN PROGRESS Patient taking differently: Take 40 mg by mouth at bedtime. 03/30/20   Lovorn, Aundra Millet, MD  Probiotic Product (PROBIOTIC COLON SUPPORT) CAPS Take 1 capsule by mouth daily.    [provider]  promethazine (PHENERGAN) 12.5 MG tablet TAKE 1 TABLET (12.5 MG TOTAL) BY MOUTH EVERY 6 (SIX) HOURS AS NEEDED FOR NAUSEA, VOMITING OR REFRACTORY NAUSEA / VOMITING. 10/12/20   Lovorn, Aundra Millet, MD  senna-docusate (SENOKOT-S) 8.6-50 MG tablet Take 2 tablets by mouth in the morning, at noon, in the evening, and at bedtime. Takes 2 tablets by mouth in the morning and 2 tablet by mouth at night. 04/13/20   [provider]  traZODone (DESYREL) 100 MG tablet TAKE 1-2 TABLETS (100-200 MG TOTAL) BY MOUTH AT BEDTIME. Patient taking differently: Take 200 mg by mouth at bedtime. 07/06/20   Lovorn, Aundra Millet, MD  vancomycin (VANCOCIN) 125 MG capsule Take 1 capsule (125 mg total) by mouth daily. 11/06/20   Veryl Speak, FNP    Allergies    Methadone  Review of Systems   Review of Systems  Constitutional:  Negative for appetite change and fatigue.  HENT:  Negative for congestion.   Respiratory:  Negative for shortness of breath.   Cardiovascular:  Negative for chest pain.  Gastrointestinal:  Positive for abdominal pain.  Genitourinary:  Positive for flank pain.  Musculoskeletal:  Positive for back pain.       Left hip pain.  Skin:  Positive for wound.  Neurological:  Positive for weakness. Negative for light-headedness.  Psychiatric/Behavioral:  Negative for confusion.    Physical Exam Updated Vital Signs BP (!) 151/89   Pulse 83   Temp 98.2 F (36.8 C) (Oral)   Resp (!) 27   SpO2 99%   Physical Exam Vitals and nursing note reviewed.  Constitutional:      Comments: Laying with his right side down moaning.  HENT:     Head: Normocephalic.  Cardiovascular:     Rate and Rhythm: Regular rhythm.  Pulmonary:     Breath sounds: No wheezing  or rhonchi.  Abdominal:     Tenderness: There is no abdominal tenderness.     Comments: Colostomy in place.  Suprapubic catheter in place  Musculoskeletal:     Cervical back: Neck supple.     Comments: Dressing on left buttock posterior hip area.  Dressing removed and his deep wound with packing.  Not foul-smelling.  Tender with palpation of the spot.  Skin:    Capillary Refill: Capillary refill takes less than 2 seconds.  Neurological:     Mental Status: He is alert and oriented to person, place, and time.    ED Results / Procedures / Treatments   Labs (all labs ordered are listed, but only abnormal results are displayed) Labs Reviewed  SEDIMENTATION RATE - Abnormal; Notable for the following components:      Result Value   Sed Rate 62 (*)    All other components within normal limits  CBC WITH DIFFERENTIAL/PLATELET - Abnormal; Notable for the following components:   RBC 3.86 (*)    Hemoglobin 11.3 (*)    HCT 37.0 (*)    Lymphs Abs 0.6 (*)    All other components within normal limits  COMPREHENSIVE METABOLIC PANEL - Abnormal; Notable for the following components:   CO2 21 (*)    Glucose, Bld 112 (*)    Albumin 3.1 (*)    ALT 46 (*)    Alkaline Phosphatase 161 (*)    All other components within normal limits  C-REACTIVE PROTEIN - Abnormal; Notable for the following components:   CRP 5.4 (*)    All other components within normal limits  RESP PANEL BY RT-PCR (FLU A&B, COVID) [id number]    EKG None  Radiology CT ABDOMEN PELVIS W CONTRAST  Result Date: 11/22/2020 CLINICAL DATA:  Decubitus ulcer.  Severe pain. EXAM: CT ABDOMEN AND PELVIS WITH CONTRAST TECHNIQUE: Multidetector CT imaging of the abdomen and pelvis was performed using the standard protocol following bolus administration of intravenous contrast. CONTRAST:  80mL OMNIPAQUE IOHEXOL 300 MG/ML  SOLN COMPARISON:  10/14/2020 FINDINGS: Lower chest: Persistent small left pleural effusion and bibasilar atelectasis. The heart is  normal in size. No pericardial effusion. Hepatobiliary: No hepatic lesions are identified. The gallbladder is unremarkable. Mild common bile duct dilatation appears relatively stable. Maximum diameter of the common bile duct in the head of the pancreas is 8 mm but it tapers normally toward the ampulla. Stable mild intrahepatic biliary dilatation also. Pancreas: Moderate pancreatic atrophy but no mass or acute inflammation. Spleen: Normal size.  No focal lesions. Adrenals/Urinary Tract: Stable left adrenal gland nodule consistent with a benign adenoma. The left adrenal gland is normal. Both kidneys are unremarkable. No renal lesions, hydronephrosis or findings to suggest pyelonephritis. No  renal calculi but there is a 2 mm distal right ureteral calculus but no hydroureter. There are stable bladder calculi. Patient has a suprapubic catheter in place. Mild uniform bladder wall thickening. No bladder mass. Stomach/Bowel: The stomach, duodenum, small bowel and colon are grossly normal. No acute inflammatory changes, mass lesions or obstructive findings. The terminal ileum is normal. The appendix is normal. 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 Vascular/Lymphatic: Stable vascular calcifications but no aneurysm or dissection. The major venous structures are patent. Reproductive: The prostate gland and seminal vesicles are unremarkable. Other: No free pelvic fluid collections or pelvic abscess. Musculoskeletal: Stable remote posttraumatic and postsurgical changes involving the bony pelvis. There is a deep decubitus type wound involving the upper left iliac bone with some type of wound packing noted. This extends right down into the widened and irregular left SI joint. 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 Do not see any significant change when compared to the recent prior CT scan. Evidence of remote removed hardware  crossing the right SI joint which is mildly widened. Left hip hardware is noted but I do not see any findings suspicious for septic arthritis or osteomyelitis involving the hips. IMPRESSION: 1. Stable remote posttraumatic and postsurgical changes involving the bony pelvis. 2. Deep soft tissue wound involving the upper left buttock area with some type of wound packing noted. This extends right down into the widened and irregular left SI joint. 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 3. No findings suspicious for septic arthritis or osteomyelitis involving the hips. 4. Suprapubic catheter in place with mild uniform bladder wall thickening. 5. Stable bladder calculi. 6. 2 mm nonobstructing distal right ureteral calculus. 7. Stable small left pleural effusion and bibasilar atelectasis. 8. Stable left adrenal gland adenoma. 9. 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 Electronically Signed   By: Rudie MeyerP.  Gallerani M.D.   On: 11/22/2020 15:16    Procedures Procedures   Medications Ordered in ED Medications  HYDROmorphone (DILAUDID) injection 1 mg (1 mg Intravenous Given 11/22/20 1320)  HYDROmorphone (DILAUDID) injection 2 mg (2 mg Intravenous Given 11/22/20 1423)  iohexol (OMNIPAQUE) 300 MG/ML solution 80 mL (80 mLs Intravenous Contrast Given 11/22/20 1456)  HYDROmorphone (DILAUDID) injection 2 mg (2 mg Intravenous Given 11/22/20 1611)    ED Course  I have reviewed the triage vital signs and the nursing notes.  Pertinent labs & imaging results that were available during my care of the patient were reviewed by me and considered in my medical decision making (see chart for details).    MDM Rules/Calculators/A&P                          Patient presents with acute on chronic pain in the left hip.  Has chronic osteomyelitis.  On chronic pain medicines.  Pain severe at this point.  Unable to relieved with IV  narcotics.  Lab work overall reassuring.  Sed rate is elevated and CRP is somewhat elevated but not as high as previous.  CT scan done and showed chronic changes.  Already on antibiotics managed by infectious disease and physical medicine rehab.  With pain that is uncontrolled by oral medications and frankly uncontrolled with the IV medications so far here.  I feels if he would benefit from admission to the hospital for further pain control and work-up. Final Clinical Impression(s) /  ED Diagnoses Final diagnoses:  Chronic osteomyelitis (HCC)  Intractable pain    Rx / DC Orders ED Discharge Orders     None        Benjiman Core, MD 11/22/20 1708

## 2020-11-23 ENCOUNTER — Telehealth: Payer: Self-pay

## 2020-11-23 ENCOUNTER — Telehealth: Payer: Self-pay | Admitting: Plastic Surgery

## 2020-11-23 DIAGNOSIS — M8660 Other chronic osteomyelitis, unspecified site: Secondary | ICD-10-CM

## 2020-11-23 DIAGNOSIS — Z933 Colostomy status: Secondary | ICD-10-CM

## 2020-11-23 DIAGNOSIS — F32A Depression, unspecified: Secondary | ICD-10-CM

## 2020-11-23 DIAGNOSIS — F419 Anxiety disorder, unspecified: Secondary | ICD-10-CM

## 2020-11-23 LAB — COMPREHENSIVE METABOLIC PANEL
ALT: 39 U/L (ref 0–44)
AST: 36 U/L (ref 15–41)
Albumin: 3.4 g/dL — ABNORMAL LOW (ref 3.5–5.0)
Alkaline Phosphatase: 156 U/L — ABNORMAL HIGH (ref 38–126)
Anion gap: 13 (ref 5–15)
BUN: 9 mg/dL (ref 6–20)
CO2: 21 mmol/L — ABNORMAL LOW (ref 22–32)
Calcium: 10 mg/dL (ref 8.9–10.3)
Chloride: 103 mmol/L (ref 98–111)
Creatinine, Ser: 0.82 mg/dL (ref 0.61–1.24)
GFR, Estimated: >60 mL/min (ref >60)
Glucose, Bld: 79 mg/dL (ref 70–99)
Potassium: 3.2 mmol/L — ABNORMAL LOW (ref 3.5–5.1)
Sodium: 137 mmol/L (ref 135–145)
Total Bilirubin: 1.1 mg/dL (ref 0.3–1.2)
Total Protein: 7.9 g/dL (ref 6.5–8.1)

## 2020-11-23 LAB — VITAMIN D 25 HYDROXY (VIT D DEFICIENCY, FRACTURES): Vit D, 25-Hydroxy: 10.3 ng/mL — ABNORMAL LOW (ref 30–100)

## 2020-11-23 MED ORDER — VANCOMYCIN HCL 125 MG PO CAPS
125.0000 mg | ORAL_CAPSULE | Freq: Two times a day (BID) | ORAL | Status: DC
  Administered 2020-11-23 – 2020-11-26 (×6): 125 mg via ORAL
  Filled 2020-11-23 (×7): qty 1

## 2020-11-23 NOTE — Telephone Encounter (Signed)
Refill request for Oxycodone last filled #180 on 10/22/20 and Fentanyl #15 on 10/24/20.

## 2020-11-23 NOTE — Telephone Encounter (Signed)
Hello,  [name] called and said that Christopher [name] was taken to the hospital on 6/12 for his hip. She is concerned he is not receiving the proper care for his wound and requested that someone call. [phone].  Thanks.
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
## 2020-11-23 NOTE — Telephone Encounter (Signed)
Returned Melanie's call. LMVM, [name] will come by the hospital some time tomorrow. The wound care team has seen him ,and they are currently doing the same dressings they are doing at home.

## 2020-11-23 NOTE — Progress Notes (Signed)
Came in room to give scheduled meds and pt is sleeping comfortably. I mention for the third time for patient to go to MRI today and he started getting worked up and crying again. He now states that his supra-pubic catheter is clogged and that he wants to see urology. Dr.Nettey has been made aware. Pt keeps saying he wants to wait later to go to MRI every time it is mentioned for him to go.

## 2020-11-23 NOTE — Plan of Care (Signed)
  Problem: Education: Goal: Knowledge of General Education information will improve Description: Including pain rating scale, medication(s)/side effects and non-pharmacologic comfort measures Outcome: Progressing   Problem: Activity: Goal: Risk for activity intolerance will decrease Outcome: Not Progressing   Problem: Coping: Goal: Level of anxiety will decrease Outcome: Not Progressing   Problem: Pain Managment: Goal: General experience of comfort will improve Outcome: Not Progressing   Problem: Skin Integrity: Goal: Risk for impaired skin integrity will decrease Outcome: Not Progressing

## 2020-11-23 NOTE — Progress Notes (Signed)
PROGRESS NOTE    Christopher CollumJames B Tillis  YNW:[id number] DOB: Feb 24, 1966 DOA: 11/22/2020 PCP: Ardith DarkParker, Caleb M, MD   Brief Narrative: Christopher [name] is a 55 y.o. male with a history of chronic osteomyelitis of left hip, chronic pain syndrome, anxiety, depression, insomnia, GERD, dysphagia, s/p colostomy. Patient presented secondary to worsening pain on top of his chronic pain. No known etiology at time of admission.   Assessment & Plan:   Principal Problem:   Chronic pain syndrome Active Problems:   Anxiety and depression   Colostomy status (HCC)   Insomnia   GERD (gastroesophageal reflux disease)   Suprapubic catheter (HCC)   Chronic osteomyelitis of hip (HCC)   Osteomyelitis (HCC)   Acute on chronic pain Chronic pain syndrome Unknown etiology. Patient with chronic osteomyelitis. It appears he requires a refill of his chronic pain medication at this time which may be contributing to presentation. CT abdomen/pelvis with no new infection identified. There was mention of a bone fragment but location does not correlate with patient's description of pain location. MRI ordered on admission and is pending. Patient is on Fentanyl patch, Percocet 10-325mg , gabapentin, Robaxin and follows with PM&R as an outpatient for pain control. -Continue home fentanyl patch, Percocet 10-325mg , gabapentin, Robaxin -Continue dilaudid IV only for breakthrough pain  Chronic osteomyelitis Patient is on ciprofloxacin and vancomycin PO. MRI ordered and is pending. ID consulted. -Continue Ciprofloxacin and Vancomycin PO  Chronic anemia Stable. No evidence of acute bleeding.  Elevated alkaline phosphatase CT scan without specific etiology. Stable. -GGT  Anxiety Depression -Continue Buspar, Klonopin and Paxil  Colostomy WOC consulted.  Insomnia -Continue trazodone and melatonin  GERD -Continue Pepcid  Suprapubic catheter Noted.   DVT prophylaxis: Lovenox Code Status:   Code Status: Full  Code Family Communication: None at bedside Disposition Plan: Discharge home likely in 1-3 days pending imaging, ID recommendations, attempt at improving pain   Consultants:  Infectious disease  Procedures:  None  Antimicrobials: Ciprofloxacin PO Vancomycin PO    Subjective: Leg pain. Anxious.   Objective: Vitals:   11/22/20 2200 11/23/20 0058 11/23/20 0450 11/23/20 0750  BP: 127/83 110/67 109/70 139/90  Pulse: 93 87 80 89  Resp: 18 17 17 17   Temp: 98 F (36.7 C) 97.9 F (36.6 C) 97.9 F (36.6 C) 98.1 F (36.7 C)  TempSrc: Oral Oral Oral Oral  SpO2: 100% 96%  97%  Weight: 71.9 kg       Intake/Output Summary (Last 24 hours) at 11/23/2020 1516 Last data filed at 11/23/2020 1311 Gross per 24 hour  Intake --  Output 500 ml  Net -500 ml   Filed Weights   11/22/20 2200  Weight: 71.9 kg    Examination:  General exam: Appears calm and comfortable Respiratory system: Clear to auscultation. Respiratory effort normal. Cardiovascular system: S1 & S2 heard, RRR. No murmurs, rubs, gallops or clicks. Gastrointestinal system: Abdomen is nondistended, soft and nontender. No organomegaly or masses felt. Normal bowel sounds heard. Central nervous system: Alert and oriented. No focal neurological deficits. Musculoskeletal: No edema. No calf tenderness. Left thigh with significant deformity secondary to previous graft with no noted open wounds anterior and lateral aspects of left thigh; he does have some tenderness of his left thigh over his greater trochanter with no noticeable erythema, fluctuance, induration Skin: No cyanosis. No rashes Psychiatry: Judgement and insight appear normal. Mood & affect appropriate.     Data Reviewed: I have personally reviewed following labs and imaging studies  CBC Lab Results  Component Value Date   WBC 5.5 11/22/2020   RBC 3.86 (L) 11/22/2020   HGB 11.3 (L) 11/22/2020   HCT 37.0 (L) 11/22/2020   MCV 95.9 11/22/2020   MCH 29.3  11/22/2020   PLT 274 11/22/2020   MCHC 30.5 11/22/2020   RDW 13.0 11/22/2020   LYMPHSABS 0.6 (L) 11/22/2020   MONOABS 0.2 11/22/2020   EOSABS 0.3 11/22/2020   BASOSABS 0.0 11/22/2020     Last metabolic panel Lab Results  Component Value Date   NA 137 11/23/2020   K 3.2 (L) 11/23/2020   CL 103 11/23/2020   CO2 21 (L) 11/23/2020   BUN 9 11/23/2020   CREATININE 0.82 11/23/2020   GLUCOSE 79 11/23/2020   GFRNONAA >60 11/23/2020   GFRAA >60 01/28/2020   CALCIUM 10.0 11/23/2020   PHOS 3.2 06/23/2020   PROT 7.9 11/23/2020   ALBUMIN 3.4 (L) 11/23/2020   BILITOT 1.1 11/23/2020   ALKPHOS 156 (H) 11/23/2020   AST 36 11/23/2020   ALT 39 11/23/2020   ANIONGAP 13 11/23/2020    CBG (last 3)  No results for input(s): GLUCAP in the last 72 hours.   GFR: Estimated Creatinine Clearance: 104.7 mL/min (by C-G formula based on SCr of 0.82 mg/dL).  Coagulation Profile: No results for input(s): INR, PROTIME in the last 168 hours.  Recent Results (from the past 240 hour(s))  Resp Panel by RT-PCR (Flu A&B, Covid) Nasopharyngeal Swab     Status: None   Collection Time: 11/22/20  4:17 PM   Specimen: Nasopharyngeal Swab; Nasopharyngeal(NP) swabs in vial transport medium  Result Value Ref Range Status   SARS Coronavirus 2 by RT PCR NEGATIVE NEGATIVE Final    Comment: (NOTE) SARS-CoV-2 target nucleic acids are NOT DETECTED.  The SARS-CoV-2 RNA is generally detectable in upper respiratory specimens during the acute phase of infection. The lowest concentration of SARS-CoV-2 viral copies this assay can detect is 138 copies/mL. A negative result does not preclude SARS-Cov-2 infection and should not be used as the sole basis for treatment or other patient management decisions. A negative result may occur with  improper specimen collection/handling, submission of specimen other than nasopharyngeal swab, presence of viral mutation(s) within the areas targeted by this assay, and inadequate number  of viral copies(<138 copies/mL). A negative result must be combined with clinical observations, patient history, and epidemiological information. The expected result is Negative.  Fact Sheet for Patients:  BloggerCourse.com  Fact Sheet for Healthcare Providers:  SeriousBroker.it  This test is no t yet approved or cleared by the [place] FDA and  has been authorized for detection and/or diagnosis of SARS-CoV-2 by FDA under an Emergency Use Authorization (EUA). This EUA will remain  in effect (meaning this test can be used) for the duration of the COVID-19 declaration under Section 564(b)(1) of the Act, 21 U.S.C.section 360bbb-3(b)(1), unless the authorization is terminated  or revoked sooner.       Influenza A by PCR NEGATIVE NEGATIVE Final   Influenza B by PCR NEGATIVE NEGATIVE Final    Comment: (NOTE) The Xpert Xpress SARS-CoV-2/FLU/RSV plus assay is intended as an aid in the diagnosis of influenza from Nasopharyngeal swab specimens and should not be used as a sole basis for treatment. Nasal washings and aspirates are unacceptable for Xpert Xpress SARS-CoV-2/FLU/RSV testing.  Fact Sheet for Patients: BloggerCourse.com  Fact Sheet for Healthcare Providers: SeriousBroker.it  This test is not yet approved or cleared by the [place] FDA and has been authorized for detection and/or  diagnosis of SARS-CoV-2 by FDA under an Emergency Use Authorization (EUA). This EUA will remain in effect (meaning this test can be used) for the duration of the COVID-19 declaration under Section 564(b)(1) of the Act, 21 U.S.C. section 360bbb-3(b)(1), unless the authorization is terminated or revoked.  Performed at [facility], [address] [place], [place] [address]         Radiology Studies: CT ABDOMEN PELVIS W CONTRAST  Result Date: 11/22/2020 CLINICAL DATA:  Decubitus  ulcer.  Severe pain. EXAM: CT ABDOMEN AND PELVIS WITH CONTRAST TECHNIQUE: Multidetector CT imaging of the abdomen and pelvis was performed using the standard protocol following bolus administration of intravenous contrast. CONTRAST:  15mL OMNIPAQUE IOHEXOL 300 MG/ML  SOLN COMPARISON:  10/14/2020 FINDINGS: Lower chest: Persistent small left pleural effusion and bibasilar atelectasis. The heart is normal in size. No pericardial effusion. Hepatobiliary: No hepatic lesions are identified. The gallbladder is unremarkable. Mild common bile duct dilatation appears relatively stable. Maximum diameter of the common bile duct in the head of the pancreas is 8 mm but it tapers normally toward the ampulla. Stable mild intrahepatic biliary dilatation also. Pancreas: Moderate pancreatic atrophy but no mass or acute inflammation. Spleen: Normal size.  No focal lesions. Adrenals/Urinary Tract: Stable left adrenal gland nodule consistent with a benign adenoma. The left adrenal gland is normal. Both kidneys are unremarkable. No renal lesions, hydronephrosis or findings to suggest pyelonephritis. No renal calculi but there is a 2 mm distal right ureteral calculus but no hydroureter. There are stable bladder calculi. Patient has a suprapubic catheter in place. Mild uniform bladder wall thickening. No bladder mass. Stomach/Bowel: The stomach, duodenum, small bowel and colon are grossly normal. No acute inflammatory changes, mass lesions or obstructive findings. The terminal ileum is normal. The appendix is normal. 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 Vascular/Lymphatic: Stable vascular calcifications but no aneurysm or dissection. The major venous structures are patent. Reproductive: The prostate gland and seminal vesicles are unremarkable. Other: No free pelvic fluid collections or pelvic abscess. Musculoskeletal: Stable remote posttraumatic and postsurgical changes involving the bony pelvis. There is a deep  decubitus type wound involving the upper left iliac bone with some type of wound packing noted. This extends right down into the widened and irregular left SI joint. 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 Do not see any significant change when compared to the recent prior CT scan. Evidence of remote removed hardware crossing the right SI joint which is mildly widened. Left hip hardware is noted but I do not see any findings suspicious for septic arthritis or osteomyelitis involving the hips. IMPRESSION: 1. Stable remote posttraumatic and postsurgical changes involving the bony pelvis. 2. Deep soft tissue wound involving the upper left buttock area with some type of wound packing noted. This extends right down into the widened and irregular left SI joint. 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 3. No findings suspicious for septic arthritis or osteomyelitis involving the hips. 4. Suprapubic catheter in place with mild uniform bladder wall thickening. 5. Stable bladder calculi. 6. 2 mm nonobstructing distal right ureteral calculus. 7. Stable small left pleural effusion and bibasilar atelectasis. 8. Stable left adrenal gland adenoma. 9. 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 Electronically Signed   By: Rudie Meyer M.D.   On: 11/22/2020 15:16        Scheduled Meds:  (  feeding supplement) PROSource Plus  30 mL Oral BID BM   acidophilus  1 capsule Oral Daily   busPIRone  10 mg Oral TID   ciprofloxacin  500 mg Oral BID   clonazepam  0.25 mg Oral Q6H   enoxaparin (LOVENOX) injection  40 mg Subcutaneous Q24H   famotidine  20 mg Oral Daily   [START ON 11/24/2020] fentaNYL  1 patch Transdermal Q48H   gabapentin  300 mg Oral TID   melatonin  3 mg Oral QHS   methocarbamol  500 mg Oral Q6H   mirabegron ER  50 mg Oral Daily    multivitamin with minerals  1 tablet Oral Daily   naloxegol oxalate  12.5 mg Oral QHS   oxybutynin  5 mg Oral TID   PARoxetine  40 mg Oral QHS   promethazine  12.5 mg Oral Q6H   senna-docusate  2 tablet Oral TID   sodium chloride flush  3 mL Intravenous Q12H   traZODone  100-200 mg Oral QHS   vancomycin  125 mg Oral BID   Continuous Infusions:  sodium chloride       LOS: 1 day     Jacquelin Hawking, MD [facility] 11/23/2020, 3:16 PM  If 7PM-7AM, please contact night-coverage [email or web address]

## 2020-11-23 NOTE — Consult Note (Addendum)
WOC Nurse Consult Note: Patient receiving care in [facility] [phone] Plastics, Dr. Ulice Bold is involved in his care. He states that she is to come to see him some time today.  Reason for Consult: Left hip wound Wound type: Surgical Pressure Injury POA: NA Measurement: 0.5 cm x 1 cm x 3.5 cm Wound bed: Unable to view. Patient moaning and groaning, states he hurts a lot. Drainage (amount, consistency, odor) Serosanguinos on dressing Periwound: Old scarring  Dressing procedure/placement/frequency: Place Iodoform gauze (lawson # [id number]) into the wound, leaving a tail for ease of removal and cover with foam dressing. Change daily. This is the current plan of care from Dr. Ulice Bold according to the patient.   WOC Nurse ostomy consult note Patient has had this colostomy for 2 years Stoma type/location: LLQ Stomal assessment/size:  Pink budded Peristomal assessment: Intact Treatment options for stomal/peristomal skin:  Output; Soft brown Ostomy pouching: 2pc. Hart [place] # [phone]) with skin barrier Hart [place] #2) Education provided: None Enrolled patient in [place] Secure Start DC program:No   Monitor the wound area(s) for worsening of condition such as: Signs/symptoms of infection, increase in size, development of or worsening of odor, development of pain, or increased pain at the affected locations.   Notify the medical team if any of these develop.  Thank you for the consult. WOC nurse will not follow at this time.   Please re-consult the WOC team if needed.  [name] [name], MSN, RN, CMSRN, Angus Seller, [facility] Wound Treatment Associate Pager [phone]

## 2020-11-23 NOTE — Progress Notes (Signed)
Pt has agreed to go to MRI and says he will push through the pain. Pt also stated that he doesn't think his catheter is clogged anymore and that he just thinks it was a muscle spasm. Will continue to monitor.

## 2020-11-23 NOTE — Progress Notes (Signed)
Pt states he is in too much pain to go down to MRI. Will try again later.

## 2020-11-24 ENCOUNTER — Inpatient Hospital Stay (HOSPITAL_COMMUNITY): Payer: No Typology Code available for payment source

## 2020-11-24 DIAGNOSIS — M86652 Other chronic osteomyelitis, left thigh: Secondary | ICD-10-CM

## 2020-11-24 LAB — COMPREHENSIVE METABOLIC PANEL
ALT: 31 U/L (ref 0–44)
AST: 26 U/L (ref 15–41)
Albumin: 2.8 g/dL — ABNORMAL LOW (ref 3.5–5.0)
Alkaline Phosphatase: 125 U/L (ref 38–126)
Anion gap: 12 (ref 5–15)
BUN: 11 mg/dL (ref 6–20)
CO2: 21 mmol/L — ABNORMAL LOW (ref 22–32)
Calcium: 9.2 mg/dL (ref 8.9–10.3)
Chloride: 102 mmol/L (ref 98–111)
Creatinine, Ser: 0.78 mg/dL (ref 0.61–1.24)
GFR, Estimated: >60 mL/min (ref >60)
Glucose, Bld: 57 mg/dL — ABNORMAL LOW (ref 70–99)
Potassium: 3.4 mmol/L — ABNORMAL LOW (ref 3.5–5.1)
Sodium: 135 mmol/L (ref 135–145)
Total Bilirubin: 1.1 mg/dL (ref 0.3–1.2)
Total Protein: 6.3 g/dL — ABNORMAL LOW (ref 6.5–8.1)

## 2020-11-24 LAB — CBC
HCT: 31.4 % — ABNORMAL LOW (ref 39.0–52.0)
Hemoglobin: 9.8 g/dL — ABNORMAL LOW (ref 13.0–17.0)
MCH: 30 pg (ref 26.0–34.0)
MCHC: 31.2 g/dL (ref 30.0–36.0)
MCV: 96 fL (ref 80.0–100.0)
Platelets: 241 10*3/uL (ref 150–400)
RBC: 3.27 MIL/uL — ABNORMAL LOW (ref 4.22–5.81)
RDW: 13.3 % (ref 11.5–15.5)
WBC: 5.1 10*3/uL (ref 4.0–10.5)
nRBC: 0 % (ref 0.0–0.2)

## 2020-11-24 LAB — GAMMA GT: GGT: 99 U/L — ABNORMAL HIGH (ref 7–50)

## 2020-11-24 MED ORDER — OXYCODONE-ACETAMINOPHEN 10-325 MG PO TABS: 1.0000 | ORAL_TABLET | ORAL | 0 refills | Status: DC | PRN

## 2020-11-24 MED ORDER — TRAZODONE HCL 50 MG PO TABS
200.0000 mg | ORAL_TABLET | Freq: Every day | ORAL | Status: DC
  Administered 2020-11-24 – 2020-11-25 (×2): 200 mg via ORAL
  Filled 2020-11-24 (×2): qty 4

## 2020-11-24 MED ORDER — ONDANSETRON HCL 4 MG/2ML IJ SOLN
4.0000 mg | Freq: Four times a day (QID) | INTRAMUSCULAR | Status: DC | PRN
  Administered 2020-11-24 – 2020-11-25 (×3): 4 mg via INTRAVENOUS
  Filled 2020-11-24 (×3): qty 2

## 2020-11-24 MED ORDER — LORAZEPAM 2 MG/ML IJ SOLN
1.0000 mg | Freq: Once | INTRAMUSCULAR | Status: AC
  Administered 2020-11-24: 1 mg via INTRAVENOUS
  Filled 2020-11-24: qty 1

## 2020-11-24 MED ORDER — FENTANYL 75 MCG/HR TD PT72: 1.0000 | MEDICATED_PATCH | TRANSDERMAL | 0 refills | Status: DC

## 2020-11-24 NOTE — Addendum Note (Signed)
Addended by: Genice Rouge on: 11/24/2020 02:58 PM   Modules accepted: Orders

## 2020-11-24 NOTE — Progress Notes (Signed)
[facility]  Date of Admission:  11/22/2020   Total days of antibiotics 3         ASSESSMENT: Patient continues to be in distress today during examination.  Patient endorses excruciating pain in the lower back.  He could have hyperalgesia associated with his chronic opioid use.  However his truthfulness about pain is questionable.  Patient agrees with trying to obtain an MRI today.  We will continue his oral Cipro and vancomycin.  PLAN: Continue p.o. Cipro and vancomycin Pending pelvis MRI  Principal Problem:   Chronic pain syndrome Active Problems:   Anxiety and depression   Colostomy status (HCC)   Insomnia   GERD (gastroesophageal reflux disease)   Suprapubic catheter (HCC)   Chronic osteomyelitis of hip (HCC)   Osteomyelitis (HCC)   Scheduled Meds:  (feeding supplement) PROSource Plus  30 mL Oral BID BM   acidophilus  1 capsule Oral Daily   busPIRone  10 mg Oral TID   ciprofloxacin  500 mg Oral BID   clonazepam  0.25 mg Oral Q6H   enoxaparin (LOVENOX) injection  40 mg Subcutaneous Q24H   famotidine  20 mg Oral Daily   fentaNYL  1 patch Transdermal Q48H   gabapentin  300 mg Oral TID   melatonin  3 mg Oral QHS   methocarbamol  500 mg Oral Q6H   mirabegron ER  50 mg Oral Daily   multivitamin with minerals  1 tablet Oral Daily   naloxegol oxalate  12.5 mg Oral QHS   oxybutynin  5 mg Oral TID   PARoxetine  40 mg Oral QHS   promethazine  12.5 mg Oral Q6H   senna-docusate  2 tablet Oral TID   sodium chloride flush  3 mL Intravenous Q12H   traZODone  200 mg Oral QHS   vancomycin  125 mg Oral BID   Continuous Infusions:  sodium chloride     PRN Meds:.sodium chloride, albuterol, dronabinol, HYDROmorphone (DILAUDID) injection, lidocaine, naLOXone (NARCAN)  injection, ondansetron (ZOFRAN) IV, oxyCODONE, sodium chloride flush   SUBJECTIVE: Patient is seen at bedside.  He is moaning and crying and endorses lower back pain.  Patient is  holding a vomit bag, no content seen in the back.  Review of Systems: ROS Per HPI  Allergies  Allergen Reactions   Methadone Other (See Comments)    Hallucinations/confusion    OBJECTIVE: Vitals:   11/23/20 0750 11/23/20 1537 11/23/20 2008 11/24/20 0811  BP: 139/90 138/80 (!) 104/91 122/69  Pulse: 89 98 (!) 108 100  Resp: 17 17 17 16   Temp: 98.1 F (36.7 C) 97.7 F (36.5 C) 97.8 F (36.6 C) 97.6 F (36.4 C)  TempSrc: Oral Oral  Oral  SpO2: 97% 98% 99% 99%  Weight:       Body mass index is 19.81 kg/m.  Physical Exam Constitutional:      General: He is in acute distress.     Comments: Patient is in acute distress.  He is moaning and groaning.   HENT:     Head: Normocephalic.  Eyes:     General: No scleral icterus.       Right eye: No discharge.        Left eye: No discharge.  Pulmonary:     Effort: Pulmonary effort is normal. No respiratory distress.  Musculoskeletal:     Comments: Tender to palpation of lower back.  The surrounding skin is non-erythematous   Skin:  General: Skin is warm.  Neurological:     Mental Status: He is alert.    Lab Results Lab Results  Component Value Date   WBC 5.1 11/24/2020   HGB 9.8 (L) 11/24/2020   HCT 31.4 (L) 11/24/2020   MCV 96.0 11/24/2020   PLT 241 11/24/2020    Lab Results  Component Value Date   CREATININE 0.78 11/24/2020   BUN 11 11/24/2020   NA 135 11/24/2020   K 3.4 (L) 11/24/2020   CL 102 11/24/2020   CO2 21 (L) 11/24/2020    Lab Results  Component Value Date   ALT 31 11/24/2020   AST 26 11/24/2020   GGT 99 (H) 11/24/2020   ALKPHOS 125 11/24/2020   BILITOT 1.1 11/24/2020     Microbiology: Recent Results (from the past 240 hour(s))  Resp Panel by RT-PCR (Flu A&B, Covid) Nasopharyngeal Swab     Status: None   Collection Time: 11/22/20  4:17 PM   Specimen: Nasopharyngeal Swab; Nasopharyngeal(NP) swabs in vial transport medium  Result Value Ref Range Status   SARS Coronavirus 2 by RT PCR NEGATIVE  NEGATIVE Final    Comment: (NOTE) SARS-CoV-2 target nucleic acids are NOT DETECTED.  The SARS-CoV-2 RNA is generally detectable in upper respiratory specimens during the acute phase of infection. The lowest concentration of SARS-CoV-2 viral copies this assay can detect is 138 copies/mL. A negative result does not preclude SARS-Cov-2 infection and should not be used as the sole basis for treatment or other patient management decisions. A negative result may occur with  improper specimen collection/handling, submission of specimen other than nasopharyngeal swab, presence of viral mutation(s) within the areas targeted by this assay, and inadequate number of viral copies(<138 copies/mL). A negative result must be combined with clinical observations, patient history, and epidemiological information. The expected result is Negative.  Fact Sheet for Patients:  BloggerCourse.com  Fact Sheet for Healthcare Providers:  SeriousBroker.it  This test is no t yet approved or cleared by the [place] FDA and  has been authorized for detection and/or diagnosis of SARS-CoV-2 by FDA under an Emergency Use Authorization (EUA). This EUA will remain  in effect (meaning this test can be used) for the duration of the COVID-19 declaration under Section 564(b)(1) of the Act, 21 U.S.C.section 360bbb-3(b)(1), unless the authorization is terminated  or revoked sooner.       Influenza A by PCR NEGATIVE NEGATIVE Final   Influenza B by PCR NEGATIVE NEGATIVE Final    Comment: (NOTE) The Xpert Xpress SARS-CoV-2/FLU/RSV plus assay is intended as an aid in the diagnosis of influenza from Nasopharyngeal swab specimens and should not be used as a sole basis for treatment. Nasal washings and aspirates are unacceptable for Xpert Xpress SARS-CoV-2/FLU/RSV testing.  Fact Sheet for Patients: BloggerCourse.com  Fact Sheet for Healthcare  Providers: SeriousBroker.it  This test is not yet approved or cleared by the [place] FDA and has been authorized for detection and/or diagnosis of SARS-CoV-2 by FDA under an Emergency Use Authorization (EUA). This EUA will remain in effect (meaning this test can be used) for the duration of the COVID-19 declaration under Section 564(b)(1) of the Act, 21 U.S.C. section 360bbb-3(b)(1), unless the authorization is terminated or revoked.  Performed at [facility], [address], [place], [place] [address]     Doran Stabler, DO [facility] [phone] pager   [phone] cell 11/24/2020, 1:27 PM
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
## 2020-11-24 NOTE — Progress Notes (Signed)
PROGRESS NOTE    Christopher [name]  LKG:[id number] DOB: 07/29/1965 DOA: 11/22/2020 PCP: Vivi Barrack, MD   Brief Narrative: Christopher [name] is a 55 y.o. male with a history of chronic osteomyelitis of left hip, chronic pain syndrome, anxiety, depression, insomnia, GERD, dysphagia, s/p colostomy. Patient presented secondary to worsening pain on top of his chronic pain. No known etiology at time of admission.   Assessment & Plan:   Principal Problem:   Chronic pain syndrome Active Problems:   Anxiety and depression   Colostomy status (HCC)   Insomnia   GERD (gastroesophageal reflux disease)   Suprapubic catheter (HCC)   Chronic osteomyelitis of hip (HCC)   Osteomyelitis (HCC)   Acute on chronic pain Chronic pain syndrome Unknown etiology. Patient with chronic osteomyelitis. It appears he requires a refill of his chronic pain medication at this time which may be contributing to presentation. CT abdomen/pelvis with no new infection identified. There was mention of a bone fragment but location does not correlate with patient's description of pain location. MRI ordered on admission and is pending. Patient is on Fentanyl patch, Percocet 10-384m, gabapentin, Robaxin and follows with PM&R as an outpatient for pain control. -Continue home fentanyl patch, Percocet 10-3247m gabapentin, Robaxin -Continue dilaudid IV only for breakthrough pain  Chronic osteomyelitis Patient is on ciprofloxacin and vancomycin PO. MRI ordered and is pending. ID consulted. CRP of 5.4 and ESR of 62. -Continue Ciprofloxacin and Vancomycin PO  Chronic anemia Stable. No evidence of acute bleeding.  Elevated alkaline phosphatase CT scan without specific etiology. GGT elevated. Trending down.  Anxiety Depression -Continue Buspar, Klonopin and Paxil  Colostomy WOC consulted.  Insomnia -Continue trazodone and melatonin  GERD -Continue Pepcid  Suprapubic catheter Noted.   DVT prophylaxis:  Lovenox Code Status:   Code Status: Full Code Family Communication: None at bedside. Wife on telephone Disposition Plan: Discharge home likely in 1-3 days pending imaging, ID recommendations, attempt at improving pain   Consultants:  Infectious disease  Procedures:  None  Antimicrobials: Ciprofloxacin PO Vancomycin PO    Subjective: Continued thigh pain. Some abdominal pain  Objective: Vitals:   11/23/20 1537 11/23/20 2008 11/24/20 0811 11/24/20 1507  BP: 138/80 (!) 104/91 122/69 128/88  Pulse: 98 (!) 108 100 99  Resp: _0 Temp: 97.7 F (36.5 C) 97.8 F (36.6 C) 97.6 F (36.4 C)   TempSrc: Oral  Oral   SpO2: 98% 99% 99% 96%  Weight:        Intake/Output Summary (Last 24 hours) at 11/24/2020 1515 Last data filed at 11/23/2020 1600 Gross per 24 hour  Intake --  Output 1100 ml  Net -1100 ml    Filed Weights   11/22/20 2200  Weight: 71.9 kg    Examination:  General exam: Appears agitated. Crying. Difficult to converse with secondary to crying and hyperventilating. Respiratory: Clear to auscultation. Respiratory effort normal with no intercostal retractions or use of accessory muscles Cardiovascular: S1 & S2 heard, RRR. No murmurs, rubs, gallops or clicks. no edema Gastrointestinal: Abdomen is nondistended, soft and nontender. No masses felt. normal bowel sounds heard. Ostomy bag Neurologic: No focal neurological deficits Musculoskeletal: No calf tenderness Skin: No cyanosis. Psychiatry: Alert and oriented. Memory intact. Crying.    Data Reviewed: I have personally reviewed following labs and imaging studies  CBC Lab Results  Component Value Date   WBC 5.1 11/24/2020   RBC 3.27 (L) 11/24/2020   HGB 9.8 (L) 11/24/2020   HCT  31.4 (L) 11/24/2020   MCV 96.0 11/24/2020   MCH 30.0 11/24/2020   PLT 241 11/24/2020   MCHC 31.2 11/24/2020   RDW 13.3 11/24/2020   LYMPHSABS 0.6 (L) 11/22/2020   MONOABS 0.2 11/22/2020   EOSABS 0.3 11/22/2020   BASOSABS  0.0 82/64/1583     Last metabolic panel Lab Results  Component Value Date   NA 135 11/24/2020   K 3.4 (L) 11/24/2020   CL 102 11/24/2020   CO2 21 (L) 11/24/2020   BUN 11 11/24/2020   CREATININE 0.78 11/24/2020   GLUCOSE 57 (L) 11/24/2020   GFRNONAA >60 11/24/2020   GFRAA >60 01/28/2020   CALCIUM 9.2 11/24/2020   PHOS 3.2 06/23/2020   PROT 6.3 (L) 11/24/2020   ALBUMIN 2.8 (L) 11/24/2020   BILITOT 1.1 11/24/2020   ALKPHOS 125 11/24/2020   AST 26 11/24/2020   ALT 31 11/24/2020   ANIONGAP 12 11/24/2020    CBG (last 3)  No results for input(s): GLUCAP in the last 72 hours.   GFR: Estimated Creatinine Clearance: 107.4 mL/min (by C-G formula based on SCr of 0.78 mg/dL).  Coagulation Profile: No results for input(s): INR, PROTIME in the last 168 hours.  Recent Results (from the past 240 hour(s))  Resp Panel by RT-PCR (Flu A&B, Covid) Nasopharyngeal Swab     Status: None   Collection Time: 11/22/20  4:17 PM   Specimen: Nasopharyngeal Swab; Nasopharyngeal(NP) swabs in vial transport medium  Result Value Ref Range Status   SARS Coronavirus 2 by RT PCR NEGATIVE NEGATIVE Final    Comment: (NOTE) SARS-CoV-2 target nucleic acids are NOT DETECTED.  The SARS-CoV-2 RNA is generally detectable in upper respiratory specimens during the acute phase of infection. The lowest concentration of SARS-CoV-2 viral copies this assay can detect is 138 copies/mL. A negative result does not preclude SARS-Cov-2 infection and should not be used as the sole basis for treatment or other patient management decisions. A negative result may occur with  improper specimen collection/handling, submission of specimen other than nasopharyngeal swab, presence of viral mutation(s) within the areas targeted by this assay, and inadequate number of viral copies(<138 copies/mL). A negative result must be combined with clinical observations, patient history, and epidemiological information. The expected result  is Negative.  Fact Sheet for Patients:  EntrepreneurPulse.com.au  Fact Sheet for Healthcare Providers:  IncredibleEmployment.be  This test is no t yet approved or cleared by the [place] FDA and  has been authorized for detection and/or diagnosis of SARS-CoV-2 by FDA under an Emergency Use Authorization (EUA). This EUA will remain  in effect (meaning this test can be used) for the duration of the COVID-19 declaration under Section 564(b)(1) of the Act, 21 U.S.C.section 360bbb-3(b)(1), unless the authorization is terminated  or revoked sooner.       Influenza A by PCR NEGATIVE NEGATIVE Final   Influenza B by PCR NEGATIVE NEGATIVE Final    Comment: (NOTE) The Xpert Xpress SARS-CoV-2/FLU/RSV plus assay is intended as an aid in the diagnosis of influenza from Nasopharyngeal swab specimens and should not be used as a sole basis for treatment. Nasal washings and aspirates are unacceptable for Xpert Xpress SARS-CoV-2/FLU/RSV testing.  Fact Sheet for Patients: EntrepreneurPulse.com.au  Fact Sheet for Healthcare Providers: IncredibleEmployment.be  This test is not yet approved or cleared by the [place] FDA and has been authorized for detection and/or diagnosis of SARS-CoV-2 by FDA under an Emergency Use Authorization (EUA). This EUA will remain in effect (meaning this test can be  used) for the duration of the COVID-19 declaration under Section 564(b)(1) of the Act, 21 U.S.C. section 360bbb-3(b)(1), unless the authorization is terminated or revoked.  Performed at [facility], [place] [address], [place], [place] [address]         Radiology Studies: MR PELVIS WO CONTRAST  Result Date: 11/24/2020 CLINICAL DATA:  Osteomyelitis suspected EXAM: MRI PELVIS WITHOUT CONTRAST TECHNIQUE: Multiplanar multisequence MR imaging of the pelvis was performed. No intravenous contrast was administered. Due to  patient pain and inability to tolerate examination, examination was terminated prematurely, with a limited selection of sequences including coronal T1 and T2 stir as well as axial T2 stir. COMPARISON:  CT abdomen pelvis, 11/22/2020 FINDINGS: Examination is very limited by sequences provided and pervasive motion artifact throughout, particularly on axial T2 sequence. Urinary Tract:  No abnormality visualized. Bowel:  Unremarkable visualized pelvic bowel loops. Vascular/Lymphatic: No pathologically enlarged lymph nodes. No significant vascular abnormality seen. Reproductive:  No mass or other significant abnormality Other:  None. Musculoskeletal: Status post screw fixation of the left femoral neck and rod fixation of the left superior pubic ramus with associated metallic susceptibility artifact. There is a deep decubitus ulceration of the left buttock overlying and extending into the sacroiliac joint. There is bony destruction of the left ilium and sacrum abutting the left sacroiliac joint with associated subtle abnormal marrow edema (series 7, image 8). There is additional deep decubitus ulceration overlying the inferior sacrum and coccyx, likewise with associated marrow edema (series 7, image 17). Additional marrow edema of the right ilium and right hemisacrum about the sacroiliac joint (series 7, image 10). IMPRESSION: 1. Examination is very limited by sequences provided and pervasive motion artifact throughout. 2. Within this limitation, there is a deep decubitus ulceration of the left buttock overlying and extending into the sacroiliac joint with associated bony destruction and marrow edema of the left ilium and sacrum. 3. Additional deep decubitus ulceration overlying the inferior sacrum and coccyx, likewise with associated marrow edema. 4. Additional marrow edema of the right ilium and right hemisacrum about the sacroiliac joint, not immediately subjacent to a deep decubitus ulceration. 5. Constellation of  findings is consistent with multifocal decubitus osteomyelitis with evidence of substantial chronicity and bony destruction. Electronically Signed   By: Eddie Candle M.D.   On: 11/24/2020 14:54        Scheduled Meds:  (feeding supplement) PROSource Plus  30 mL Oral BID BM   acidophilus  1 capsule Oral Daily   busPIRone  10 mg Oral TID   ciprofloxacin  500 mg Oral BID   clonazepam  0.25 mg Oral Q6H   enoxaparin (LOVENOX) injection  40 mg Subcutaneous Q24H   famotidine  20 mg Oral Daily   fentaNYL  1 patch Transdermal Q48H   gabapentin  300 mg Oral TID   melatonin  3 mg Oral QHS   methocarbamol  500 mg Oral Q6H   mirabegron ER  50 mg Oral Daily   multivitamin with minerals  1 tablet Oral Daily   naloxegol oxalate  12.5 mg Oral QHS   oxybutynin  5 mg Oral TID   PARoxetine  40 mg Oral QHS   promethazine  12.5 mg Oral Q6H   senna-docusate  2 tablet Oral TID   sodium chloride flush  3 mL Intravenous Q12H   traZODone  200 mg Oral QHS   vancomycin  125 mg Oral BID   Continuous Infusions:  sodium chloride       LOS:  2 days     Cordelia Poche, MD [facility] 11/24/2020, 3:15 PM  If 7PM-7AM, please contact night-coverage [email or web address]

## 2020-11-25 LAB — BASIC METABOLIC PANEL
Anion gap: 16 — ABNORMAL HIGH (ref 5–15)
BUN: 9 mg/dL (ref 6–20)
CO2: 18 mmol/L — ABNORMAL LOW (ref 22–32)
Calcium: 9.1 mg/dL (ref 8.9–10.3)
Chloride: 104 mmol/L (ref 98–111)
Creatinine, Ser: 0.74 mg/dL (ref 0.61–1.24)
GFR, Estimated: >60 mL/min (ref >60)
Glucose, Bld: 56 mg/dL — ABNORMAL LOW (ref 70–99)
Potassium: 3.2 mmol/L — ABNORMAL LOW (ref 3.5–5.1)
Sodium: 138 mmol/L (ref 135–145)

## 2020-11-25 MED ORDER — POTASSIUM CHLORIDE CRYS ER 20 MEQ PO TBCR
40.0000 meq | EXTENDED_RELEASE_TABLET | Freq: Once | ORAL | Status: AC
  Administered 2020-11-25: 40 meq via ORAL
  Filled 2020-11-25: qty 2

## 2020-11-25 NOTE — Progress Notes (Signed)
PROGRESS NOTE    Christopher [name]  MIW:[id number] DOB: 21-Nov-1965 DOA: 11/22/2020 PCP: Vivi Barrack, MD   Brief Narrative: Christopher [name] is a 55 y.o. male with a history of chronic osteomyelitis of left hip, chronic pain syndrome, anxiety, depression, insomnia, GERD, dysphagia, s/p colostomy. Patient presented secondary to worsening pain on top of his chronic pain. No known etiology at time of admission.   Assessment & Plan:   Principal Problem:   Chronic pain syndrome Active Problems:   Anxiety and depression   Colostomy status (HCC)   Insomnia   GERD (gastroesophageal reflux disease)   Suprapubic catheter (HCC)   Chronic osteomyelitis of hip (HCC)   Osteomyelitis (HCC)   Acute on chronic pain Chronic pain syndrome Unknown etiology. Patient with chronic osteomyelitis. It appears he requires a refill of his chronic pain medication at this time which may be contributing to presentation. CT abdomen/pelvis with no new infection identified. There was mention of a bone fragment but location does not correlate with patient's description of pain location. MRI ordered on admission and is pending. Patient is on Fentanyl patch, Percocet 10-366m, gabapentin, Robaxin and follows with PM&R as an outpatient for pain control. -Continue home fentanyl patch, Percocet 10-3235m gabapentin, Robaxin -Continue dilaudid IV only for breakthrough pain  Chronic osteomyelitis Patient is on ciprofloxacin and vancomycin PO. ID consulted. CRP of 5.4 and ESR of 62. MRI without obvious acute process, although resolution was compromised by motion artifact -Continue Ciprofloxacin and Vancomycin PO -ID recommendations pending today  Chronic anemia Stable. No evidence of acute bleeding.  Elevated alkaline phosphatase CT scan without specific etiology. GGT elevated. Trending down.  Anxiety Depression -Continue Buspar, Klonopin and Paxil  Colostomy WOC consulted.  Insomnia -Continue  trazodone and melatonin  GERD -Continue Pepcid  Suprapubic catheter Noted.   DVT prophylaxis: Lovenox Code Status:   Code Status: Full Code Family Communication: None at bedside. Wife on telephone Disposition Plan: Discharge home likely in 1-2 days pending imaging, ID recommendations, attempt at improving pain, PT/OT recommendations   Consultants:  Infectious disease  Procedures:  None  Antimicrobials: Ciprofloxacin PO Vancomycin PO    Subjective: Pain is better this morning.  Objective: Vitals:   11/24/20 1507 11/25/20 0526 11/25/20 0600 11/25/20 0750  BP: 128/88 124/86 122/82 118/71  Pulse: 99 96 98 96  Resp:  _0 Temp:  98 F (36.7 C) 98.2 F (36.8 C) 98 F (36.7 C)  TempSrc:  Oral Oral Oral  SpO2: 96% 97% 96% 97%  Weight:       No intake or output data in the 24 hours ending 11/25/20 1223  Filed Weights   11/22/20 2200  Weight: 71.9 kg    Examination:  General exam: Appears calm and comfortable Respiratory system: Clear to auscultation. Respiratory effort normal. Cardiovascular system: S1 & S2 heard, RRR. No murmurs, rubs, gallops or clicks. Gastrointestinal system: Abdomen is mildly distended, soft and minimally tender. Ostomy in place. Normal bowel sounds heard. Central nervous system: Alert and oriented. No focal neurological deficits. Musculoskeletal: No edema. Skin: No cyanosis. No rashes Psychiatry: Judgement and insight appear normal. Mood & affect appropriate. Not crying.    Data Reviewed: I have personally reviewed following labs and imaging studies  CBC Lab Results  Component Value Date   WBC 5.1 11/24/2020   RBC 3.27 (L) 11/24/2020   HGB 9.8 (L) 11/24/2020   HCT 31.4 (L) 11/24/2020   MCV 96.0 11/24/2020   MCH 30.0 11/24/2020  PLT 241 11/24/2020   MCHC 31.2 11/24/2020   RDW 13.3 11/24/2020   LYMPHSABS 0.6 (L) 11/22/2020   MONOABS 0.2 11/22/2020   EOSABS 0.3 11/22/2020   BASOSABS 0.0 70/26/3785     Last metabolic  panel Lab Results  Component Value Date   NA 138 11/25/2020   K 3.2 (L) 11/25/2020   CL 104 11/25/2020   CO2 18 (L) 11/25/2020   BUN 9 11/25/2020   CREATININE 0.74 11/25/2020   GLUCOSE 56 (L) 11/25/2020   GFRNONAA >60 11/25/2020   GFRAA >60 01/28/2020   CALCIUM 9.1 11/25/2020   PHOS 3.2 06/23/2020   PROT 6.3 (L) 11/24/2020   ALBUMIN 2.8 (L) 11/24/2020   BILITOT 1.1 11/24/2020   ALKPHOS 125 11/24/2020   AST 26 11/24/2020   ALT 31 11/24/2020   ANIONGAP 16 (H) 11/25/2020    CBG (last 3)  No results for input(s): GLUCAP in the last 72 hours.   GFR: Estimated Creatinine Clearance: 107.4 mL/min (by C-G formula based on SCr of 0.74 mg/dL).  Coagulation Profile: No results for input(s): INR, PROTIME in the last 168 hours.  Recent Results (from the past 240 hour(s))  Resp Panel by RT-PCR (Flu A&B, Covid) Nasopharyngeal Swab     Status: None   Collection Time: 11/22/20  4:17 PM   Specimen: Nasopharyngeal Swab; Nasopharyngeal(NP) swabs in vial transport medium  Result Value Ref Range Status   SARS Coronavirus 2 by RT PCR NEGATIVE NEGATIVE Final    Comment: (NOTE) SARS-CoV-2 target nucleic acids are NOT DETECTED.  The SARS-CoV-2 RNA is generally detectable in upper respiratory specimens during the acute phase of infection. The lowest concentration of SARS-CoV-2 viral copies this assay can detect is 138 copies/mL. A negative result does not preclude SARS-Cov-2 infection and should not be used as the sole basis for treatment or other patient management decisions. A negative result may occur with  improper specimen collection/handling, submission of specimen other than nasopharyngeal swab, presence of viral mutation(s) within the areas targeted by this assay, and inadequate number of viral copies(<138 copies/mL). A negative result must be combined with clinical observations, patient history, and epidemiological information. The expected result is Negative.  Fact Sheet for  Patients:  EntrepreneurPulse.com.au  Fact Sheet for Healthcare Providers:  IncredibleEmployment.be  This test is no t yet approved or cleared by the [place] FDA and  has been authorized for detection and/or diagnosis of SARS-CoV-2 by FDA under an Emergency Use Authorization (EUA). This EUA will remain  in effect (meaning this test can be used) for the duration of the COVID-19 declaration under Section 564(b)(1) of the Act, 21 U.S.C.section 360bbb-3(b)(1), unless the authorization is terminated  or revoked sooner.       Influenza A by PCR NEGATIVE NEGATIVE Final   Influenza B by PCR NEGATIVE NEGATIVE Final    Comment: (NOTE) The Xpert Xpress SARS-CoV-2/FLU/RSV plus assay is intended as an aid in the diagnosis of influenza from Nasopharyngeal swab specimens and should not be used as a sole basis for treatment. Nasal washings and aspirates are unacceptable for Xpert Xpress SARS-CoV-2/FLU/RSV testing.  Fact Sheet for Patients: EntrepreneurPulse.com.au  Fact Sheet for Healthcare Providers: IncredibleEmployment.be  This test is not yet approved or cleared by the [place] FDA and has been authorized for detection and/or diagnosis of SARS-CoV-2 by FDA under an Emergency Use Authorization (EUA). This EUA will remain in effect (meaning this test can be used) for the duration of the COVID-19 declaration under Section 564(b)(1) of the Act,  21 U.S.C. section 360bbb-3(b)(1), unless the authorization is terminated or revoked.  Performed at [facility], [place] [address], [place], [place] [address]         Radiology Studies: MR PELVIS WO CONTRAST  Result Date: 11/24/2020 CLINICAL DATA:  Osteomyelitis suspected EXAM: MRI PELVIS WITHOUT CONTRAST TECHNIQUE: Multiplanar multisequence MR imaging of the pelvis was performed. No intravenous contrast was administered. Due to patient pain and inability to  tolerate examination, examination was terminated prematurely, with a limited selection of sequences including coronal T1 and T2 stir as well as axial T2 stir. COMPARISON:  CT abdomen pelvis, 11/22/2020 FINDINGS: Examination is very limited by sequences provided and pervasive motion artifact throughout, particularly on axial T2 sequence. Urinary Tract:  No abnormality visualized. Bowel:  Unremarkable visualized pelvic bowel loops. Vascular/Lymphatic: No pathologically enlarged lymph nodes. No significant vascular abnormality seen. Reproductive:  No mass or other significant abnormality Other:  None. Musculoskeletal: Status post screw fixation of the left femoral neck and rod fixation of the left superior pubic ramus with associated metallic susceptibility artifact. There is a deep decubitus ulceration of the left buttock overlying and extending into the sacroiliac joint. There is bony destruction of the left ilium and sacrum abutting the left sacroiliac joint with associated subtle abnormal marrow edema (series 7, image 8). There is additional deep decubitus ulceration overlying the inferior sacrum and coccyx, likewise with associated marrow edema (series 7, image 17). Additional marrow edema of the right ilium and right hemisacrum about the sacroiliac joint (series 7, image 10). IMPRESSION: 1. Examination is very limited by sequences provided and pervasive motion artifact throughout. 2. Within this limitation, there is a deep decubitus ulceration of the left buttock overlying and extending into the sacroiliac joint with associated bony destruction and marrow edema of the left ilium and sacrum. 3. Additional deep decubitus ulceration overlying the inferior sacrum and coccyx, likewise with associated marrow edema. 4. Additional marrow edema of the right ilium and right hemisacrum about the sacroiliac joint, not immediately subjacent to a deep decubitus ulceration. 5. Constellation of findings is consistent with  multifocal decubitus osteomyelitis with evidence of substantial chronicity and bony destruction. Electronically Signed   By: Eddie Candle M.D.   On: 11/24/2020 14:54        Scheduled Meds:  (feeding supplement) PROSource Plus  30 mL Oral BID BM   acidophilus  1 capsule Oral Daily   busPIRone  10 mg Oral TID   ciprofloxacin  500 mg Oral BID   clonazepam  0.25 mg Oral Q6H   enoxaparin (LOVENOX) injection  40 mg Subcutaneous Q24H   famotidine  20 mg Oral Daily   fentaNYL  1 patch Transdermal Q48H   gabapentin  300 mg Oral TID   melatonin  3 mg Oral QHS   methocarbamol  500 mg Oral Q6H   mirabegron ER  50 mg Oral Daily   multivitamin with minerals  1 tablet Oral Daily   naloxegol oxalate  12.5 mg Oral QHS   oxybutynin  5 mg Oral TID   PARoxetine  40 mg Oral QHS   promethazine  12.5 mg Oral Q6H   senna-docusate  2 tablet Oral TID   sodium chloride flush  3 mL Intravenous Q12H   traZODone  200 mg Oral QHS   vancomycin  125 mg Oral BID   Continuous Infusions:  sodium chloride       LOS: 3 days     Cordelia Poche, MD [facility] 11/25/2020, 12:23 PM  If 7PM-7AM, please contact night-coverage [email or web address]

## 2020-11-25 NOTE — TOC Progression Note (Addendum)
Transition of Care [facility]) - Progression Note    Patient Details  Name: PARRISH BONN MRN: [id number] Date of Birth: 02/07/1966  Transition of Care [facility]) CM/SW Contact  Ralene Bathe, LCSWA Phone Number: 11/25/2020, 1:56 PM  Clinical Narrative:    12:27-  CSW received a call from [facility], [phone].  Ms. Sheffield Slider is the patient's worker's comp representative and was calling to inquire about whether the patient is discharging today.  CSW will return call to Ms. Sheffield Slider when information is received.  15:36-  CSW returned call to Ms. Sheffield Slider and informed her that there is not a d/c summary in for the patient at this time.  CSW will follow up when patient is medically ready to d/c.      Expected Discharge Plan: Home w Home Health Services Barriers to Discharge: Continued Medical Work up  Expected Discharge Plan and Services Expected Discharge Plan: Home w Home Health Services In-house Referral: Clinical Social Work Discharge Planning Services: CM Consult Post Acute Care Choice: Home Health Living arrangements for the past 2 months: Single Family Home                           HH Arranged: RN [facility] Agency: [facility] Date HH Agency Contacted: 11/23/20 Time HH Agency Contacted: 1608 Representative spoke with at [facility] Agency: Werner Lean   Social Determinants of Health (SDOH) Interventions    Readmission Risk Interventions Readmission Risk Prevention Plan 06/23/2020 11/01/2019 09/18/2019  Transportation Screening Complete Complete Complete  PCP or Specialist Appt within 3-5 Days Not Complete Complete -  Not Complete comments First available appt on 06/2020. - -  HRI or Home Care Consult Complete Complete -  Social Work Consult for Recovery Care Planning/Counseling Complete Complete -  Palliative Care Screening Not Applicable Complete -  Medication Review Oceanographer) - Complete Complete  PCP or Specialist appointment within 3-5 days of discharge - -  Complete  PCP/Specialist Appt Not Complete comments - - -  HRI or Home Care Consult - - Complete  HRI or Home Care Consult Pt Refusal Comments - - -  SW Recovery Care/Counseling Consult - - Complete  SW Consult Not Complete Comments - - -  Palliative Care Screening - - Complete  Skilled Nursing Facility - - Complete  Some recent data might be hidden

## 2020-11-26 MED ORDER — VITAMIN D (ERGOCALCIFEROL) 1.25 MG (50000 UNIT) PO CAPS: 50000.0000 [IU] | ORAL_CAPSULE | ORAL | 0 refills | Status: DC

## 2020-11-26 MED ORDER — VANCOMYCIN HCL 125 MG PO CAPS: 125.0000 mg | ORAL_CAPSULE | Freq: Two times a day (BID) | ORAL | 1 refills | Status: DC

## 2020-11-26 MED ORDER — VITAMIN D (ERGOCALCIFEROL) 1.25 MG (50000 UNIT) PO CAPS
50000.0000 [IU] | ORAL_CAPSULE | ORAL | Status: DC
  Administered 2020-11-26: 50000 [IU] via ORAL
  Filled 2020-11-26: qty 1

## 2020-11-26 MED ORDER — BUSPIRONE HCL 5 MG PO TABS: 10.0000 mg | ORAL_TABLET | Freq: Three times a day (TID) | ORAL | Status: DC

## 2020-11-26 MED ORDER — FENTANYL CITRATE (PF) 100 MCG/2ML IJ SOLN
50.0000 ug | Freq: Once | INTRAMUSCULAR | Status: AC
  Administered 2020-11-26: 50 ug via INTRAVENOUS
  Filled 2020-11-26: qty 2

## 2020-11-26 NOTE — TOC Transition Note (Signed)
Transition of Care (TOC) - CM/SW Discharge Note   Patient Details  Name: Christopher [name] MRN: [id number] Date of Birth: 03/21/1966  Transition of Care (TOC) CM/SW Contact:   F , LCSWA Phone Number: 11/26/2020, 2:35 PM   Clinical Narrative:    Patient will DC to: home with home health Anticipated DC date: 11/26/2020 Family notified: Yes Transport by: [facility]   Per MD patient ready for DC home with home health.  CSW met with the patient and inquired about any d/c needs.  The patient reported that he has everything he needs at home and will need [facility] transport.  The patient called his spouse while CSW was in the room and informed of transport home.  CSW verified phone number, address, and PCP.   CSW spoke with Cindy Hale, worker's comp representative, [phone].  Ms. Hale was updated on the patient's needs after d/c.    CSW spoke with Kenzie with [facility].  This agency has been providing home health for the patient and plan to continue.  CSW informed the agency of the additional services that were requested by the Attending.    Ambulance transport requested for patient.   CSW will sign off for now as social work intervention is no longer needed. Please consult us again if new needs arise.     Final next level of care: Home w Home Health Services Barriers to Discharge: Barriers Resolved   Patient Goals and CMS Choice Patient states their goals for this hospitalization and ongoing recovery are:: pt to come home with HH CMS Medicare.gov Compare Post Acute Care list provided to:: Patient Represenative (must comment) Choice offered to / list presented to : Spouse  Discharge Placement                       Discharge Plan and Services In-house Referral: Clinical Social Work Discharge Planning Services: CM Consult Post Acute Care Choice: Home Health                    HH Arranged: RN HH Agency: [facility] Date HH Agency  Contacted: 11/23/20 Time HH Agency Contacted: [phone] Representative spoke with at HH Agency: Kenzie Burcham  Social Determinants of Health (SDOH) Interventions     Readmission Risk Interventions Readmission Risk Prevention Plan 06/23/2020 11/01/2019 09/18/2019  Transportation Screening Complete Complete Complete  PCP or Specialist Appt within 3-5 Days Not Complete Complete -  Not Complete comments First available appt on 06/2020. - -  HRI or Home Care Consult Complete Complete -  Social Work Consult for Recovery Care Planning/Counseling Complete Complete -  Palliative Care Screening Not Applicable Complete -  Medication Review (RN Care Manager) - Complete Complete  PCP or Specialist appointment within 3-5 days of discharge - - Complete  PCP/Specialist Appt Not Complete comments - - -  HRI or Home Care Consult - - Complete  HRI or Home Care Consult Pt Refusal Comments - - -  SW Recovery Care/Counseling Consult - - Complete  SW Consult Not Complete Comments - - -  Palliative Care Screening - - Complete  Skilled Nursing Facility - - Complete  Some recent data might be hidden       

## 2020-11-26 NOTE — Discharge Summary (Addendum)
    Physician Discharge Summary  Christopher [name] MRN:[id number] DOB: 09/13/1965 DOA: 11/22/2020  PCP: Parker, Caleb M, MD  Admit date: 11/22/2020 Discharge date: 11/26/2020  Admitted From: home Discharge disposition: home   Recommendations for Outpatient Follow-Up:   Continue cipro/vanc Vit D supplementation: 50,000 units PO weekly x 6 wks, then 1000 daily   Discharge Diagnosis:   Principal Problem:   Chronic pain syndrome Active Problems:   Anxiety and depression   Colostomy status (HCC)   Insomnia   GERD (gastroesophageal reflux disease)   Suprapubic catheter (HCC)   Chronic osteomyelitis of hip (HCC)   Osteomyelitis (HCC)    Discharge Condition: Improved.  Diet recommendation: Low sodium, heart healthy.  Carbohydrate-modified.  Regular.  Wound care: None.  Code status: Full.   History of Present Illness:   Christopher [name] is a 55 y.o. male with medical history significant of chronic osteomyelitis in his left hip on antibiotics and followed by infectious disease, chronic pain syndrome, anxiety and depression, insomnia, GERD, chronic nausea, dysphagia followed by GI and colostomy bag. He presented to ER with sudden onset of chronic pain.  He woke up last night with excruciating pain in his left hip where his chronic osteomyelitis is located. His wife tells me he has been inconsolable. He denies any trauma or fall. No fever/chills, drainage or change to wound area. He has not missed any doses of his medication. He is on chronic pain medication including fentnyl patch, oxycodone, gabapentin, robaxin. Pain is 10/10 and sharp and constant. Dilaudid has only helped for 5 minutes and then it comes back. He states the pain can radiate through the entire back. No weakness, tingling or numbness down legs. No shortness of breath, cough, abdominal pain out of baseline.    Hospital Course by Problem:   Acute on chronic pain Chronic pain syndrome Unknown etiology.  Patient with chronic osteomyelitis.  - CT abdomen/pelvis with no new infection identified. There was mention of a bone fragment but location does not correlate with patient's description of pain location.  -Continue home fentanyl patch, Percocet 10-325mg, gabapentin, Robaxin  -family states they have all of his medications at home  Chronic osteomyelitis Patient is on ciprofloxacin and vancomycin PO. ID consulted. CRP of 5.4 and ESR of 62. MRI without obvious acute process, although resolution was compromised by motion artifact -Continue Ciprofloxacin and Vancomycin PO -no further ID recommendations   Chronic anemia Stable. No evidence of acute bleeding.   Elevated alkaline phosphatase CT scan without specific etiology. GGT elevated. Trending down. -outpatient follow up   Anxiety Depression -Continue Buspar, Klonopin and Paxil   Insomnia -Continue trazodone and melatonin   GERD -Continue Pepcid   Suprapubic catheter Noted.  Chronic resp failure -says he wears 2L O2 at home    Medical Consultants:   ID Wound care   Discharge Exam:   Vitals:   11/26/20 0654 11/26/20 0739  BP: 112/75 123/73  Pulse: 84 (!) 103  Resp: 17 16  Temp: 97.8 F (36.6 C) 97.8 F (36.6 C)  SpO2: 99% 99%   Vitals:   11/25/20 1956 11/26/20 0100 11/26/20 0654 11/26/20 0739  BP: 122/74 137/78 112/75 123/73  Pulse: 98 98 84 (!) 103  Resp: 20 (!) 22 17 16  Temp: (!) 97.5 F (36.4 C) 97.7 F (36.5 C) 97.8 F (36.6 C) 97.8 F (36.6 C)  TempSrc: Oral Oral Oral Oral  SpO2: 100% 98% 99% 99%  Weight:          General exam: Appears calm and comfortable.  The results of significant diagnostics from this hospitalization (including imaging, microbiology, ancillary and laboratory) are listed below for reference.     Procedures and Diagnostic Studies:   CT ABDOMEN PELVIS W CONTRAST  Result Date: 11/22/2020 CLINICAL DATA:  Decubitus ulcer.  Severe pain. EXAM: CT ABDOMEN AND PELVIS WITH  CONTRAST TECHNIQUE: Multidetector CT imaging of the abdomen and pelvis was performed using the standard protocol following bolus administration of intravenous contrast. CONTRAST:  80mL OMNIPAQUE IOHEXOL 300 MG/ML  SOLN COMPARISON:  10/14/2020 FINDINGS: Lower chest: Persistent small left pleural effusion and bibasilar atelectasis. The heart is normal in size. No pericardial effusion. Hepatobiliary: No hepatic lesions are identified. The gallbladder is unremarkable. Mild common bile duct dilatation appears relatively stable. Maximum diameter of the common bile duct in the head of the pancreas is 8 mm but it tapers normally toward the ampulla. Stable mild intrahepatic biliary dilatation also. Pancreas: Moderate pancreatic atrophy but no mass or acute inflammation. Spleen: Normal size.  No focal lesions. Adrenals/Urinary Tract: Stable left adrenal gland nodule consistent with a benign adenoma. The left adrenal gland is normal. Both kidneys are unremarkable. No renal lesions, hydronephrosis or findings to suggest pyelonephritis. No renal calculi but there is a 2 mm distal right ureteral calculus but no hydroureter. There are stable bladder calculi. Patient has a suprapubic catheter in place. Mild uniform bladder wall thickening. No bladder mass. Stomach/Bowel: The stomach, duodenum, small bowel and colon are grossly normal. No acute inflammatory changes, mass lesions or obstructive findings. The terminal ileum is normal. The appendix is normal. 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 Vascular/Lymphatic: Stable vascular calcifications but no aneurysm or dissection. The major venous structures are patent. Reproductive: The prostate gland and seminal vesicles are unremarkable. Other: No free pelvic fluid collections or pelvic abscess. Musculoskeletal: Stable remote posttraumatic and postsurgical changes involving the bony pelvis. There is a deep decubitus type wound involving the upper left iliac bone  with some type of wound packing noted. This extends right down into the widened and irregular left SI joint. 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 Do not see any significant change when compared to the recent prior CT scan. Evidence of remote removed hardware crossing the right SI joint which is mildly widened. Left hip hardware is noted but I do not see any findings suspicious for septic arthritis or osteomyelitis involving the hips. IMPRESSION: 1. Stable remote posttraumatic and postsurgical changes involving the bony pelvis. 2. Deep soft tissue wound involving the upper left buttock area with some type of wound packing noted. This extends right down into the widened and irregular left SI joint. 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 3. No findings suspicious for septic arthritis or osteomyelitis involving the hips. 4. Suprapubic catheter in place with mild uniform bladder wall thickening. 5. Stable bladder calculi. 6. 2 mm nonobstructing distal right ureteral calculus. 7. Stable small left pleural effusion and bibasilar atelectasis. 8. Stable left adrenal gland adenoma. 9. 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 Electronically Signed   By: P.  Gallerani M.D.   On: 11/22/2020 15:16     Labs:   Basic Metabolic Panel: Recent Labs  Lab 11/22/20 1245 11/23/20 1048 11/24/20 0244 11/25/20 0506  NA 139 137 135 138  K 3.6 3.2* 3.4* 3.2*  CL 107 103 102 104  CO2 21* 21* 21*   18*  GLUCOSE 112* 79 57* 56*  BUN 8 9 11 9  CREATININE 0.68 0.82 0.78 0.74  CALCIUM 9.8 10.0 9.2 9.1   GFR Estimated Creatinine Clearance: 107.4 mL/min (by C-G formula based on SCr of 0.74 mg/dL). Liver Function Tests: Recent Labs  Lab 11/22/20 1245 11/23/20 1048 11/24/20 0244  AST 33 36 26  ALT 46* 39 31  ALKPHOS 161* 156* 125  BILITOT 0.8  1.1 1.1  PROT 7.4 7.9 6.3*  ALBUMIN 3.1* 3.4* 2.8*   No results for input(s): LIPASE, AMYLASE in the last 168 hours. No results for input(s): AMMONIA in the last 168 hours. Coagulation profile No results for input(s): INR, PROTIME in the last 168 hours.  CBC: Recent Labs  Lab 11/22/20 1245 11/24/20 0244  WBC 5.5 5.1  NEUTROABS 4.4  --   HGB 11.3* 9.8*  HCT 37.0* 31.4*  MCV 95.9 96.0  PLT 274 241   Cardiac Enzymes: Recent Labs  Lab 11/22/20 1323  CKTOTAL 30*   BNP: Invalid input(s): POCBNP CBG: No results for input(s): GLUCAP in the last 168 hours. D-Dimer No results for input(s): DDIMER in the last 72 hours. Hgb A1c No results for input(s): HGBA1C in the last 72 hours. Lipid Profile No results for input(s): CHOL, HDL, LDLCALC, TRIG, CHOLHDL, LDLDIRECT in the last 72 hours. Thyroid function studies No results for input(s): TSH, T4TOTAL, T3FREE, THYROIDAB in the last 72 hours.  Invalid input(s): FREET3 Anemia work up No results for input(s): VITAMINB12, FOLATE, FERRITIN, TIBC, IRON, RETICCTPCT in the last 72 hours. Microbiology Recent Results (from the past 240 hour(s))  Resp Panel by RT-PCR (Flu A&B, Covid) Nasopharyngeal Swab     Status: None   Collection Time: 11/22/20  4:17 PM   Specimen: Nasopharyngeal Swab; Nasopharyngeal(NP) swabs in vial transport medium  Result Value Ref Range Status   SARS Coronavirus 2 by RT PCR NEGATIVE NEGATIVE Final    Comment: (NOTE) SARS-CoV-2 target nucleic acids are NOT DETECTED.  The SARS-CoV-2 RNA is generally detectable in upper respiratory specimens during the acute phase of infection. The lowest concentration of SARS-CoV-2 viral copies this assay can detect is 138 copies/mL. A negative result does not preclude SARS-Cov-2 infection and should not be used as the sole basis for treatment or other patient management decisions. A negative result may occur with  improper specimen collection/handling, submission of specimen  other than nasopharyngeal swab, presence of viral mutation(s) within the areas targeted by this assay, and inadequate number of viral copies(<138 copies/mL). A negative result must be combined with clinical observations, patient history, and epidemiological information. The expected result is Negative.  Fact Sheet for Patients:  https://www.fda.gov/media/152166/download  Fact Sheet for Healthcare Providers:  https://www.fda.gov/media/152162/download  This test is no t yet approved or cleared by the [place] FDA and  has been authorized for detection and/or diagnosis of SARS-CoV-2 by FDA under an Emergency Use Authorization (EUA). This EUA will remain  in effect (meaning this test can be used) for the duration of the COVID-19 declaration under Section 564(b)(1) of the Act, 21 U.S.C.section 360bbb-3(b)(1), unless the authorization is terminated  or revoked sooner.       Influenza A by PCR NEGATIVE NEGATIVE Final   Influenza B by PCR NEGATIVE NEGATIVE Final    Comment: (NOTE) The Xpert Xpress SARS-CoV-2/FLU/RSV plus assay is intended as an aid in the diagnosis of influenza from Nasopharyngeal swab specimens and should not be used as a sole basis for treatment. Nasal washings and aspirates are unacceptable for   Xpert Xpress SARS-CoV-2/FLU/RSV testing.  Fact Sheet for Patients: https://www.fda.gov/media/152166/download  Fact Sheet for Healthcare Providers: https://www.fda.gov/media/152162/download  This test is not yet approved or cleared by the [place] FDA and has been authorized for detection and/or diagnosis of SARS-CoV-2 by FDA under an Emergency Use Authorization (EUA). This EUA will remain in effect (meaning this test can be used) for the duration of the COVID-19 declaration under Section 564(b)(1) of the Act, 21 U.S.C. section 360bbb-3(b)(1), unless the authorization is terminated or revoked.  Performed at [facility], [address] [place],  [place] [address]      Discharge Instructions:   Discharge Instructions     Diet general   Complete by: As directed    Discharge wound care:   Complete by: As directed    Place Iodoform gauze into the wound, leaving a tail for ease of removal and cover with foam dressing. Change daily.   Increase activity slowly   Complete by: As directed       Allergies as of 11/26/2020       Reactions   Methadone Other (See Comments)   Hallucinations/confusion        Medication List     TAKE these medications    albuterol 108 (90 Base) MCG/ACT inhaler Commonly known as: VENTOLIN HFA INHALE 2 PUFFS BY MOUTH EVERY 6 HOURS AS NEEDED FOR WHEEZE OR SHORTNESS OF BREATH (WAITING ON PA) What changed:  how much to take how to take this when to take this reasons to take this additional instructions   Blood Pressure Cuff Misc Use daily as needed to check blood pressure.   busPIRone 5 MG tablet Commonly known as: BUSPAR Take 2 tablets (10 mg total) by mouth 3 (three) times daily. for anxiety   ciprofloxacin 500 MG tablet Commonly known as: CIPRO Take 1 tablet (500 mg total) by mouth 2 (two) times daily.   clonazePAM 0.5 MG tablet Commonly known as: KLONOPIN Take 0.5 tablets (0.25 mg total) by mouth every 6 (six) hours as needed for anxiety. What changed:  when to take this additional instructions   dextromethorphan-guaiFENesin 30-600 MG 12hr tablet Commonly known as: MUCINEX DM Take 2 tablets by mouth 2 (two) times daily.   dronabinol 2.5 MG capsule Commonly known as: MARINOL TAKE 1 CAPSULE (2.5 MG TOTAL) BY MOUTH 2 (TWO) TIMES DAILY BEFORE A MEAL. What changed:  when to take this reasons to take this   famotidine 20 MG tablet Commonly known as: PEPCID Take 20 mg by mouth daily.   feeding supplement (PRO-STAT 64) Liqd Take 30 mLs by mouth 3 (three) times daily with meals.   fentaNYL 75 MCG/HR Commonly known as: DURAGESIC Place 1 patch onto the skin every other day.    gabapentin 300 MG capsule Commonly known as: NEURONTIN Take 1 capsule (300 mg total) by mouth 3 (three) times daily.   lidocaine 5 % Commonly known as: LIDODERM PLACE 3 PATCHES ONTO THE SKIN EVERY 12 HOURS. REMOVE & DISCARD PATCH WITHIN 12 HOURS OR AS DIRECTED BY MD What changed: See the new instructions.   melatonin 3 MG Tabs tablet Commonly known as: [employer] Melatonin Take 1 tablet (3 mg total) by mouth at bedtime.   methocarbamol 500 MG tablet Commonly known as: ROBAXIN TAKE 1 TABLET BY MOUTH EVERY 6 HOURS AS NEEDED FOR MUSCLE SPASMS What changed:  when to take this additional instructions   mirabegron ER 50 MG Tb24 tablet Commonly known as: MYRBETRIQ Take 50 mg by mouth daily.     multivitamin with minerals Tabs tablet Take 1 tablet by mouth daily.   naloxegol oxalate 12.5 MG Tabs tablet Commonly known as: Movantik Take 1 tablet (12.5 mg total) by mouth daily. What changed: when to take this   naloxone 4 MG/0.1ML Liqd nasal spray kit Commonly known as: NARCAN To use if pt develops unconsciousness or confusion that family thinks is related to opioids. What changed:  how much to take how to take this when to take this reasons to take this additional instructions   oxybutynin 5 MG tablet Commonly known as: DITROPAN Take 5 mg by mouth See admin instructions. Take 5 mg by mouth three times a day and an additional 5 mg once daily as needed for urinary urgency   oxyCODONE-acetaminophen 10-325 MG tablet Commonly known as: Percocet Take 1 tablet by mouth every 4 (four) hours as needed (breakthrough pain). What changed: when to take this   PARoxetine 40 MG tablet Commonly known as: PAXIL TAKE 1 TABLET BY MOUTH EVERYDAY AT BEDTIME - PA IN PROGRESS What changed: See the new instructions.   Probiotic Colon Support Caps Take 1 capsule by mouth daily.   promethazine 12.5 MG tablet Commonly known as: PHENERGAN TAKE 1 TABLET (12.5 MG TOTAL) BY MOUTH EVERY 6 (SIX) HOURS AS  NEEDED FOR NAUSEA, VOMITING OR REFRACTORY NAUSEA / VOMITING. What changed:  when to take this additional instructions   senna-docusate 8.6-50 MG tablet Commonly known as: Senokot-S Take 2 tablets by mouth 3 (three) times daily.   traZODone 100 MG tablet Commonly known as: DESYREL TAKE 1-2 TABLETS (100-200 MG TOTAL) BY MOUTH AT BEDTIME. What changed: how much to take   vancomycin 125 MG capsule Commonly known as: VANCOCIN Take 1 capsule (125 mg total) by mouth in the morning and at bedtime. What changed: when to take this   Vitamin D (Ergocalciferol) 1.25 MG (50000 UNIT) Caps capsule Commonly known as: DRISDOL Take 1 capsule (50,000 Units total) by mouth every 7 (seven) days.               Discharge Care Instructions  (From admission, onward)           Start     Ordered   11/26/20 0000  Discharge wound care:       Comments: Place Iodoform gauze into the wound, leaving a tail for ease of removal and cover with foam dressing. Change daily.   11/26/20 1151              Time coordinating discharge: 35 min  Signed:   U  DO  [facility] 11/26/2020, 12:30 PM      

## 2020-11-26 NOTE — Progress Notes (Signed)
Attempted to provide discharge education and instructions to Pt, he states that his wife knows his medications and knows what to do, he also added that his wife changes his dressings. Pt refused for me to call his wife to discuss discharge instructions.

## 2020-11-26 NOTE — Progress Notes (Signed)
Discharge summary packet provided to pt by charge nurse, and per charge nurse she's not sure whether pt understood her instructions.Pt seen in room and reinforrced the discharge summary documents with instructions. No complaints voiced. Still awaiting a transport from [facility]. Pt remains alert/oriented in no apparent distress. D/C to home with home health as ordered.

## 2020-11-26 NOTE — Progress Notes (Signed)
PT Cancellation Note  Patient Details Name: Christopher [name] MRN: [id number] DOB: 09/12/1965   Cancelled Treatment:    Reason Eval/Treat Not Completed: Pain limiting ability to participate.  Pt reporting he is too sore to mobilize with PT even for transfer practice.  He reports that his home is handicap accessible and when he doesn't feel good he transfers to [facility] and uses WC (vs when he feels good he does short distance gait with RW).  I asked if we can't confirm he can transfer how will he get home and he stated "[facility]".  He reports being active with a home RN and is interested in re-starting home PT.   I recommend [facility] transport home and HHPT.   Thanks,  Corinna Capra, PT, DPT  Acute Rehabilitation Ortho Tech Supervisor [phone] pager #[phone] office      Lurena Joiner B Florian Chauca 11/26/2020, 1:26 PM

## 2020-11-26 NOTE — Evaluation (Signed)
Occupational Therapy Evaluation Patient Details Name: Christopher [name] MRN: [id number] DOB: [birth year] Today's Date: 11/26/2020    History of Present Illness Christopher [name] is a 55 y.o. male with medical history significant of chronic osteomyelitis in his left hip on antibiotics and followed by infectious disease, chronic pain syndrome, anxiety and depression, insomnia, GERD, chronic nausea, dysphagia followed by GI and colostomy bag. He presented to ER with sudden onset of chronic pain.   Clinical Impression   Pt presents with decline in function and safety with ADLs and ADL mobility. Pt limited by c/o pain in back and generalized "all over". Pt refused OOB activity but agreeable to EOB activity. PTA pt lived at home with his wife and was Ind with sponge baths, toileting, dressing and used RW and power w/c for mobility. Pt currently requires set up with UB selfcare, mod - min A with LB selfcare due to pain. Pt would benefit from acute OT services to address impairments to maximize level of function and safety    Follow Up Recommendations  Home health OT;Supervision/Assistance - 24 hour    Equipment Recommendations  Other (comment) (reacher, LH bath sponge)    Recommendations for Other Services       Precautions / Restrictions Precautions Precautions: Fall Restrictions Weight Bearing Restrictions: No      Mobility Bed Mobility Overal bed mobility: Needs Assistance Bed Mobility: Supine to Sit;Sit to Supine     Supine to sit: Supervision;HOB elevated Sit to supine: Supervision   General bed mobility comments: used rail, increased time and effort    Transfers                 General transfer comment: pt refused OOB activity    Balance Overall balance assessment: Needs assistance Sitting-balance support: No upper extremity supported;Feet supported Sitting balance-Leahy Scale: Fair                                     ADL either performed or  assessed with clinical judgement   ADL Overall ADL's : Needs assistance/impaired Eating/Feeding: Set up;Independent;Sitting   Grooming: Set up;Supervision/safety;Wash/dry face;Wash/dry hands;Sitting   Upper Body Bathing: Set up;Supervision/ safety;Sitting   Lower Body Bathing: Minimal assistance Lower Body Bathing Details (indicate cue type and reason): simulated seated EOB Upper Body Dressing : Set up;Supervision/safety;Sitting   Lower Body Dressing: Moderate assistance Lower Body Dressing Details (indicate cue type and reason): mod A to donn socks due to back pain   Toilet Transfer Details (indicate cue type and reason): refused           General ADL Comments: Pt agreeable to EOB activity only     Vision Patient Visual Report: No change from baseline       Perception     Praxis      Pertinent Vitals/Pain Pain Assessment: Faces Faces Pain Scale: Hurts whole lot Pain Location: back Pain Descriptors / Indicators: Aching;Grimacing;Guarding;Moaning Pain Intervention(s): Limited activity within patient's tolerance;Monitored during session;Patient requesting pain meds-RN notified;Repositioned     Hand Dominance Right   Extremity/Trunk Assessment Upper Extremity Assessment Upper Extremity Assessment: Overall WFL for tasks assessed   Lower Extremity Assessment Lower Extremity Assessment: Defer to PT evaluation       Communication Communication Communication: No difficulties   Cognition Arousal/Alertness: Awake/alert Behavior During Therapy: Flat affect;Anxious Overall Cognitive Status: Within Functional Limits for tasks assessed  General Comments       Exercises     Shoulder Instructions      Home Living Family/patient expects to be discharged to:: Private residence Living Arrangements: Spouse/significant other;Children Available Help at Discharge: Family;Available 24 hours/day Type of Home: Mobile  home Home Access: Ramped entrance     Home Layout: One level     Bathroom Shower/Tub: Chief Strategy Officer: Standard     Home Equipment: Environmental consultant - 2 wheels;Hospital bed;Wheelchair - power          Prior Functioning/Environment          Comments: walks with a walker, girlfriend manages meds and colostomy care. Pt dresses and sponge bathes on his own        OT Problem List: Decreased strength;Impaired balance (sitting and/or standing);Pain;Decreased activity tolerance      OT Treatment/Interventions: Self-care/ADL training;DME and/or AE instruction;Therapeutic activities;Therapeutic exercise;Patient/family education    OT Goals(Current goals can be found in the care plan section) Acute Rehab OT Goals Patient Stated Goal: get some rest, go home today OT Goal Formulation: With patient Time For Goal Achievement: 12/10/20 Potential to Achieve Goals: Good ADL Goals Pt Will Perform Grooming: with set-up;with caregiver independent in assisting Pt Will Perform Upper Body Bathing: with set-up;with modified independence;with caregiver independent in assisting Pt Will Perform Lower Body Bathing: with min assist;with supervision;with caregiver independent in assisting Pt Will Perform Upper Body Dressing: with set-up;with modified independence;with caregiver independent in assisting Pt Will Perform Lower Body Dressing: with min assist;with supervision;with caregiver independent in assisting Pt Will Transfer to Toilet: with mod assist;with min assist;stand pivot transfer;bedside commode Pt Will Perform Toileting - Clothing Manipulation and hygiene: with min assist;with min guard assist;with supervision  OT Frequency: Min 2X/week   Barriers to D/C:            Co-evaluation              AM-PAC OT "6 Clicks" Daily Activity     Outcome Measure Help from another person eating meals?: None Help from another person taking care of personal grooming?: A Little Help  from another person toileting, which includes using toliet, bedpan, or urinal?: A Lot Help from another person bathing (including washing, rinsing, drying)?: A Lot Help from another person to put on and taking off regular upper body clothing?: A Little Help from another person to put on and taking off regular lower body clothing?: A Lot 6 Click Score: 16   End of Session Nurse Communication: Mobility status  Activity Tolerance: Patient limited by pain Patient left: in bed;with call bell/phone within reach  OT Visit Diagnosis: Other abnormalities of gait and mobility (R26.89);Muscle weakness (generalized) (M62.81);Pain Pain - part of body:  (back)                Time: 0109-3235 OT Time Calculation (min): 24 min Charges:  OT General Charges $OT Visit: 1 Visit OT Evaluation $OT Eval Moderate Complexity: 1 Mod OT Treatments $Self Care/Home Management : 8-22 mins    Galen Manila 11/26/2020, 1:47 PM

## 2020-11-27 ENCOUNTER — Other Ambulatory Visit: Payer: Self-pay | Admitting: Physical Medicine and Rehabilitation

## 2020-11-27 ENCOUNTER — Telehealth: Payer: Self-pay

## 2020-11-27 NOTE — Telephone Encounter (Signed)
Transition Care Management Follow-up Telephone Call Date of discharge and from where: [facility] 11/26/20 [place] How have you been since you were released from the hospital? Not to much better Any questions or concerns? No  Items Reviewed: Did the pt receive and understand the discharge instructions provided? Yes  Medications obtained and verified? Yes  Other? No  Any new allergies since your discharge? No  Dietary orders reviewed? Yes Do you have support at home? Yes   Home Care and Equipment/Supplies: Were home health services ordered? yes If so, what is the name of the agency? [facility]  Has the agency set up a time to come to the patient's home? yes Were any new equipment or medical supplies ordered?  No What is the name of the medical supply agency?  Were you able to get the supplies/equipment? yes Do you have any questions related to the use of the equipment or supplies? No  Functional Questionnaire: (I = Independent and D = Dependent) ADLs: D  Bathing/Dressing- D  Meal Prep- D  Eating- D  Maintaining continence- D  Transferring/Ambulation- D  Managing Meds- D wife assist  Follow up appointments reviewed:  PCP Hospital f/u appt confirmed? No   pt is looking to see ortho trauma asap Specialist Hospital f/u appt confirmed? Yes  Scheduled Home health nurse has the information Are transportation arrangements needed? No  If their condition worsens, is the pt aware to call PCP or go to the Emergency Dept.? Yes Was the patient provided with contact information for the PCP's office or ED? Yes Was to pt encouraged to call back with questions or concerns? Yes

## 2020-12-01 ENCOUNTER — Telehealth: Payer: Self-pay | Admitting: *Deleted

## 2020-12-01 DIAGNOSIS — G8921 Chronic pain due to trauma: Secondary | ICD-10-CM

## 2020-12-01 DIAGNOSIS — F411 Generalized anxiety disorder: Secondary | ICD-10-CM

## 2020-12-01 NOTE — Telephone Encounter (Signed)
Cindy NCM for Christopher [name] called to report that her was hospitalized recently but is home and he constantly cries out in pain. They were going to try and get him in earlier with Dr Berline Chough if possible (has appt 01/01/21). She is wondering if in the meantime there is any suggestion Dr Berline Chough could offer to help Christopher Leveque. Please advise. The NCM # is [phone]

## 2020-12-03 NOTE — Telephone Encounter (Signed)
Spoke To Arline Asp -worker's comp and [name]- wife about pt- they both feel that it could be anxiety and gets inconsolable when this occurs- comes in waves.   I'm not sure if I feel comfortable increasing pain meds, however will d/w Palliative care about his pain regimen; have also placed internal referral for Psychiatry.   Will call and l/m for palliative care again.  Did d/w with Palliative care NP- they will pass this info on to Dr Genia Hotter and see if he can be seen outpt in Cancer center- or give me some guidance on what to do from here.

## 2020-12-08 ENCOUNTER — Ambulatory Visit (INDEPENDENT_AMBULATORY_CARE_PROVIDER_SITE_OTHER): Payer: PRIVATE HEALTH INSURANCE | Admitting: Internal Medicine

## 2020-12-08 ENCOUNTER — Encounter: Payer: Self-pay | Admitting: Internal Medicine

## 2020-12-08 ENCOUNTER — Other Ambulatory Visit: Payer: Self-pay

## 2020-12-08 VITALS — BP 110/68 | HR 111 | Temp 98.2°F | Resp 20 | Ht 75.0 in

## 2020-12-08 DIAGNOSIS — J9611 Chronic respiratory failure with hypoxia: Secondary | ICD-10-CM | POA: Diagnosis not present

## 2020-12-08 DIAGNOSIS — Z87891 Personal history of nicotine dependence: Secondary | ICD-10-CM

## 2020-12-08 DIAGNOSIS — J690 Pneumonitis due to inhalation of food and vomit: Secondary | ICD-10-CM | POA: Diagnosis not present
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
## 2020-12-08 NOTE — Patient Instructions (Signed)
The patient should have follow up scheduled with myself in 6 months.    Start doing nebulized saline up to three times a day as needed, and follow with a flutter valve.   How does the Flutter work? When you breathe out through the Flutter pressure builds up in your lungs. This helps to keep the airways open wide and also allows air to get behind sputum and help move it upwards. The vibrations transmitted through the chest wall by the action of the steel ball also help to loosen sputum from the sides of the airways.  Using the Flutter Your physiotherapist will show you how to use the Flutter. This guide is a reminder for you.  Treatment can be carried out in sitting or any postural drainage position (where possible). Relax your stomach muscles and take a few normal breaths before using the Flutter.  Close your lips around the mouthpiece of the Flutter, making sure there is a good seal.  Take a slightly bigger than normal breath in through your nose and then breath out through the Flutter. You may feel vibrations on your chest wall.  Repeat eight to ten times.  Following this, you should put the Flutter down and do huffing and coughing to clear any sputum. You should then have a period of relaxed breathing before continuing.  This cycle should continue for 15 to 20 minutes or until you have cleared all your sputum. You can alter the angle of the Flutter to make it slightly more difficult. A huff can also be performed into the Flutter to help mobilise sticky secretions.

## 2020-12-09 MED ORDER — GABAPENTIN 300 MG PO CAPS: 300.0000 mg | ORAL_CAPSULE | Freq: Three times a day (TID) | ORAL | 3 refills | Status: DC

## 2020-12-09 NOTE — Telephone Encounter (Signed)
Spoke to Dr Phillips Odor- palliative care Also increased pt's Gabapentin to 600 mg BID x 4 days then 600 mg TID-  Also try increasing Oxy - can take 1.5-2 pills at a time when he's screaming to see if will help- let me know by Friday if need to increase Oxycodone dose or not.  Will wait to increase Duragesic

## 2020-12-15 ENCOUNTER — Telehealth (INDEPENDENT_AMBULATORY_CARE_PROVIDER_SITE_OTHER): Payer: PRIVATE HEALTH INSURANCE | Admitting: Surgical

## 2020-12-15 ENCOUNTER — Encounter: Payer: Self-pay | Admitting: Surgical

## 2020-12-15 DIAGNOSIS — T07XXXA Unspecified multiple injuries, initial encounter: Secondary | ICD-10-CM

## 2020-12-15 DIAGNOSIS — L98499 Non-pressure chronic ulcer of skin of other sites with unspecified severity: Secondary | ICD-10-CM | POA: Diagnosis not present

## 2020-12-15 DIAGNOSIS — M86652 Other chronic osteomyelitis, left thigh: Secondary | ICD-10-CM | POA: Diagnosis not present
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
## 2020-12-16 ENCOUNTER — Other Ambulatory Visit: Payer: Self-pay | Admitting: Physical Medicine and Rehabilitation

## 2020-12-21 ENCOUNTER — Telehealth: Payer: Self-pay

## 2020-12-21 NOTE — Telephone Encounter (Signed)
Pt is requesting , refill on oxycodone and fentanyl patches last seen was 11/04/2020 , has upcoming appt on 01/01/2021 per pmp Oxycodone was lf 11/24/2020 . Per pmp fentanyl patch was filled 11/24/2020.

## 2020-12-22 ENCOUNTER — Ambulatory Visit (INDEPENDENT_AMBULATORY_CARE_PROVIDER_SITE_OTHER): Payer: PRIVATE HEALTH INSURANCE | Admitting: Family Medicine

## 2020-12-22 ENCOUNTER — Encounter: Payer: Self-pay | Admitting: Family Medicine

## 2020-12-22 ENCOUNTER — Other Ambulatory Visit: Payer: Self-pay

## 2020-12-22 VITALS — BP 111/78 | HR 78 | Temp 97.0°F | Ht 75.0 in

## 2020-12-22 DIAGNOSIS — E440 Moderate protein-calorie malnutrition: Secondary | ICD-10-CM

## 2020-12-22 DIAGNOSIS — E43 Unspecified severe protein-calorie malnutrition: Secondary | ICD-10-CM

## 2020-12-22 DIAGNOSIS — F419 Anxiety disorder, unspecified: Secondary | ICD-10-CM

## 2020-12-22 DIAGNOSIS — M7918 Myalgia, other site: Secondary | ICD-10-CM

## 2020-12-22 DIAGNOSIS — J9691 Respiratory failure, unspecified with hypoxia: Secondary | ICD-10-CM

## 2020-12-22 DIAGNOSIS — F32A Depression, unspecified: Secondary | ICD-10-CM

## 2020-12-22 DIAGNOSIS — M8668 Other chronic osteomyelitis, other site: Secondary | ICD-10-CM

## 2020-12-22 DIAGNOSIS — R1310 Dysphagia, unspecified: Secondary | ICD-10-CM | POA: Diagnosis not present

## 2020-12-22 MED ORDER — ALBUTEROL SULFATE HFA 108 (90 BASE) MCG/ACT IN AERS: INHALATION_SPRAY | RESPIRATORY_TRACT | 1 refills | Status: DC

## 2020-12-22 NOTE — Assessment & Plan Note (Signed)
On chronic Cipro and vancomycin per ID.

## 2020-12-22 NOTE — Progress Notes (Signed)
Chief Complaint:  Christopher [name] is a 55 y.o. male who presents today for a TCM visit.  Assessment/Plan:  Chronic Problems Addressed Today: Dysphagia Has follow-up with GI pending.  Also has speech evaluation as an outpatient pending.  Myofascial pain dysfunction syndrome Following with PMR.  May benefit from switching Paxil to Cymbalta- he will discuss this with his physiatrist.  Protein-calorie malnutrition, severe Related to his dysphagia.  Discussed importance of high protein and high calorie foods.  Anxiety and depression He is on Paxil 40 mg daily as well as Klonopin 0.25 mg every 6 hours as needed.  May benefit from switching Paxil to Cymbalta due to his chronic pain-will defer this to his physiatrist.  Chronic osteomyelitis of hip (HCC) On chronic Cipro and vancomycin per ID.  Respiratory failure with hypoxia (HCC) Follows with pulmonology.  Concern for possible aspiration and has speech referral pending.  Slight crackles noted on today's exam likely due to atelectasis.  We will be using his home flutter valve and incentive spirometer.      Subjective:  HPI:  Summary of Hospital admission: Reason for admission: Back pain  Date of admission: 11/22/20  Date of discharge: 11/26/20 Date of Interactive contact: 11/24/2020 Summary of Hospital course: Patient presented with intractable pain secondary to osteomyelitis.  Had work-up in the hospital which was unrevealing.  He was continued on pain meds and chronic antibiotics.  He was discharged home in stable condition.   Interim history:  Since being home his condition has been about the same.  Still has uncontrolled pain.  Is currently following with PMNR for this and is on fentanyl patch with Percocet as needed.  Pain is still not controlled.  Additionally on gabapentin.  He has had continued issues with appetite and weight loss.  Patient will sometimes have severe abdominal pain after eating.  This is sporadic and  random in nature.  He has been trying to eat high-calorie and high-protein foods.  He does not want a feeding tube.  Does not like protein shakes.  PMH:  The following were reviewed and entered/updated in epic: Past Medical History:  Diagnosis Date   Acute on chronic respiratory failure with hypoxia (HCC) 06/2018   trach removed 11-16-2018, on vent from jan until may 2020 - uses albuterol prn   Anxiety    Bacteremia due to Pseudomonas 06/2018   Chronic osteomyelitis (HCC)    Chronic pain syndrome    Clostridium difficile colitis 10/30/2019   tx with abx    Depression    DVT (deep venous thrombosis) (HCC) 2020   right brachial post PICC line   History of blood transfusion 06/2018   History of Clostridioides difficile colitis    History of kidney stones    Hypertension    norvasc d/c by pcp on 11/05/19   Multiple traumatic injuries    Penile pain 11/18/2019   Pneumonia 11/2009   2020 x 2   Walker as ambulation aid    Wheelchair bound    electric   Wound discharge    left hip wound with bloody/clear drainage change dressing q day surgilube with gauze, between legs wound using calcium algenate pad bid   Patient Active Problem List   Diagnosis Date Noted   Chronic osteomyelitis of hip (HCC) 11/22/2020   Osteomyelitis (HCC) 11/22/2020   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/22/2020   Respiratory failure with hypoxia (HCC) 07/02/2020   Dysphagia 07/02/2020   Therapeutic opioid induced constipation 04/11/2020   Status post  peripherally inserted central catheter (PICC) central line placement 03/17/2020   Skin-picking disorder 03/12/2020   CHF (congestive heart failure) (HCC) 01/27/2020   Recurrent pain of right knee 11/08/2019   Elevated blood pressure reading 11/05/2019   Acute deep vein thrombosis (DVT) of brachial vein of right upper extremity (HCC) 11/01/2019   Myofascial pain dysfunction syndrome 08/05/2019   Protein-calorie malnutrition, severe 06/01/2019   Complicated urinary tract  infection 05/30/2019   Wound infection, posttraumatic 02/28/2019   Hydronephrosis concurrent with and due to calculi of kidney and ureter 02/28/2019   GERD (gastroesophageal reflux disease) 02/28/2019   Suprapubic catheter (HCC) 02/28/2019   Anxiety 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 01/29/2019   Reactive depression 01/29/2019   Chronic pain due to trauma 01/29/2019   Neuropathic pain 01/29/2019   Colostomy status (HCC) 01/14/2019   Insomnia 01/14/2019   Current use of proton pump inhibitor 01/14/2019   Wound of buttock 12/28/2018   Anxiety and depression 12/17/2018   Debility 11/23/2018   Acute on chronic respiratory failure with hypoxia (HCC)    Chronic pain syndrome    Multiple traumatic injuries    Pressure injury of skin 08/13/2018   Urethral injury 07/13/2018   Past Surgical History:  Procedure Laterality Date   APPLICATION OF A-CELL OF BACK N/A 08/06/2018   Procedure: Application Of A-Cell Of Back;  Surgeon: Peggye Form, DO;  Location: [facility] OR;  Service: Plastics;  Laterality: N/A;   APPLICATION OF A-CELL OF EXTREMITY Left 08/06/2018   Procedure: Application Of A-Cell Of Extremity;  Surgeon: Peggye Form, DO;  Location: [facility] OR;  Service: Plastics;  Laterality: Left;   APPLICATION OF A-CELL OF EXTREMITY Left 09/18/2019   Procedure: APPLICATION OF A-CELL OF EXTREMITY;  Surgeon: Peggye Form, DO;  Location: [facility] OR;  Service: Plastics;  Laterality: Left;   APPLICATION OF WOUND VAC  07/12/2018   Procedure: Application Of Wound Vac to the Left Thigh and Scrotum.;  Surgeon: Roby Lofts, MD;  Location: [facility] OR;  Service: Orthopedics;;   APPLICATION OF WOUND VAC  07/10/2018   Procedure: Application Of Wound Vac;  Surgeon: Berna Bue, MD;  Location: [facility] OR;  Service: General;;   COLON SURGERY  2020   colostomy   COLOSTOMY N/A 07/23/2018   Procedure: COLOSTOMY;  Surgeon: Violeta Gelinas, MD;  Location: [facility] OR;  Service: General;  Laterality: N/A;    CYSTOSCOPY W/ URETERAL STENT PLACEMENT N/A 07/15/2018   Procedure: RETROGRADE URETHROGRAM;  Surgeon: Marcine Matar, MD;  Location: [facility] OR;  Service: Urology;  Laterality: N/A;   CYSTOSCOPY WITH LITHOLAPAXY N/A 05/06/2019   Procedure: CYSTOSCOPY BASKET BLADDER STONE EXTRACTION;  Surgeon: Malen Gauze, MD;  Location: [facility];  Service: Urology;  Laterality: N/A;  30 MINS   CYSTOSTOMY N/A 05/06/2019   Procedure: REPLACEMENT OF SUPRAPUBIC CATHETER;  Surgeon: Malen Gauze, MD;  Location: [facility];  Service: Urology;  Laterality: N/A;   DEBRIDEMENT AND CLOSURE WOUND Left 03/04/2019   Procedure: Excision of hip wound with placement of Acell;  Surgeon: Peggye Form, DO;  [facility]: [facility];  Service: Plastics;  Laterality: Left;   ESOPHAGOGASTRODUODENOSCOPY N/A 08/14/2018   Procedure: ESOPHAGOGASTRODUODENOSCOPY (EGD);  Surgeon: Violeta Gelinas, MD;  Location: [facility] ENDOSCOPY;  Service: General;  Laterality: N/A;  bedside   FACIAL RECONSTRUCTION SURGERY     X 2--once as a teenager and second time in his 85's   HARDWARE REMOVAL Left 03/04/2019   Procedure: Left Hip Hardware Removal;  Surgeon: Roby Lofts, MD;  [facility];  Service: Orthopedics;  Laterality: Left;   HIP PINNING,CANNULATED Left 07/12/2018   Procedure: CANNULATED HIP PINNING;  Surgeon: Roby Lofts, MD;  [facility]: [facility];  Service: Orthopedics;  Laterality: Left;   HIP SURGERY     HOLMIUM LASER APPLICATION Right 07/15/2019   Procedure: HOLMIUM LASER APPLICATION;  Surgeon: Malen Gauze, MD;  Location: [facility];  Service: Urology;  Laterality: Right;   I & D EXTREMITY Left 07/25/2018   Procedure: Debridement of buttock, scrotum and left leg, placement of acell and vac;  Surgeon: Peggye Form, DO;  [facility]: [facility];  Service: Plastics;  Laterality: Left;   I & D EXTREMITY N/A 08/06/2018   Procedure: Debridement of buttock, scrotum and left leg;  Surgeon:  Peggye Form, DO;  [facility]: [facility];  Service: Plastics;  Laterality: N/A;   I & D EXTREMITY N/A 08/13/2018   Procedure: Debridement of buttock, scrotum and left leg, placement of acell and vac;  Surgeon: Peggye Form, DO;  [facility]: [facility];  Service: Plastics;  Laterality: N/A;  90 min, please   INCISION AND DRAINAGE HIP Left 09/18/2019   Procedure: IRRIGATION AND DEBRIDEMENT HIP/ PELVIS WITH WOUND VAC PLACEMENT;  Surgeon: Roby Lofts, MD;  [facility]: [facility];  Service: Orthopedics;  Laterality: Left;   INCISION AND DRAINAGE OF WOUND N/A 07/18/2018   Procedure: Debridement of left leg, buttocks and scrotal wound with placement of acell and Flexiseal;  Surgeon: Peggye Form, DO;  [facility]: [facility];  Service: Plastics;  Laterality: N/A;   INCISION AND DRAINAGE OF WOUND Left 08/29/2018   Procedure: Debridement of buttock, scrotum and left leg, placement of acell and vac;  Surgeon: Peggye Form, DO;  [facility]: [facility];  Service: Plastics;  Laterality: Left;  75 min, please   INCISION AND DRAINAGE OF WOUND Bilateral 10/23/2018   Procedure: DEBRIDEMENT OF BUTTOCK,SCROTUM, AND LEG WOUNDS WITH PLACEMENT OF ACELL- BILATERAL 90 MIN;  Surgeon: Peggye Form, DO;  [facility]: [facility];  Service: Plastics;  Laterality: Bilateral;   IR ANGIOGRAM PELVIS SELECTIVE OR SUPRASELECTIVE  07/10/2018   IR ANGIOGRAM PELVIS SELECTIVE OR SUPRASELECTIVE  07/10/2018   IR ANGIOGRAM SELECTIVE EACH ADDITIONAL VESSEL  07/10/2018   IR EMBO ART  VEN HEMORR LYMPH EXTRAV  INC GUIDE ROADMAPPING  07/10/2018   IR NEPHROSTOMY PLACEMENT LEFT  04/05/2019   IR NEPHROSTOMY PLACEMENT RIGHT  05/31/2019   IR US GUIDE BX ASP/DRAIN  07/10/2018   IR US GUIDE VASC ACCESS RIGHT  07/10/2018   IR VENO/EXT/UNI LEFT  07/10/2018   IRRIGATION AND DEBRIDEMENT OF WOUND WITH SPLIT THICKNESS SKIN GRAFT Left 09/19/2018   Procedure: Debridement of gluteal wound with placement of acell to left leg.;  Surgeon: Peggye Form, DO;  [facility];  Service: Plastics;  Laterality: Left;  2.5 hours, please   LAPAROTOMY N/A 07/12/2018   Procedure: EXPLORATORY LAPAROTOMY;  Surgeon: Violeta Gelinas, MD;  Location: [facility] OR;  Service: General;  Laterality: N/A;   LAPAROTOMY N/A 07/15/2018   Procedure: WOUND EXPLORATION; CLOSURE OF ABDOMEN;  Surgeon: Violeta Gelinas, MD;  Location: [facility] OR;  Service: General;  Laterality: N/A;   LAPAROTOMY  07/10/2018   Procedure: Exploratory Laparotomy;  Surgeon: Berna Bue, MD;  Location: [facility] OR;  Service: General;;   MASS EXCISION Left 09/18/2019   Procedure: EXCISION UPPER LEFT INNER THIGH WOUND;  Surgeon: Peggye Form, DO;  [facility];  Service:  Plastics;  Laterality: Left;   NEPHROLITHOTOMY Right 07/15/2019   Procedure: NEPHROLITHOTOMY PERCUTANEOUS;  Surgeon: Malen GauzeMcKenzie, Patrick L, MD;  [facility];  Service: Urology;  Laterality: Right;  90 MINS   PEG PLACEMENT N/A 08/14/2018   Procedure: PERCUTANEOUS ENDOSCOPIC GASTROSTOMY (PEG) PLACEMENT;  Surgeon: Violeta Gelinashompson, Burke, MD;  [facility];  Service: General;  Laterality: N/A;   PERCUTANEOUS TRACHEOSTOMY N/A 08/02/2018   Procedure: PERCUTANEOUS TRACHEOSTOMY;  Surgeon: Violeta Gelinashompson, Burke, MD;  [facility];  Service: General;  Laterality: N/A;   RADIOLOGY WITH ANESTHESIA N/A 07/10/2018   Procedure: IR WITH ANESTHESIA;  Surgeon: Simonne ComeWatts, John, MD;  [facility];  Service: Radiology;  Laterality: N/A;   RADIOLOGY WITH ANESTHESIA Right 07/10/2018   Procedure: Ir With Anesthesia;  Surgeon: Simonne ComeWatts, John, MD;  [facility];  Service: Radiology;  Laterality: Right;   SCROTAL EXPLORATION N/A 07/15/2018   Procedure: SCROTUM DEBRIDEMENT;  Surgeon: Marcine Matarahlstedt, Stephen, MD;  [facility];  Service: Urology;  Laterality: N/A;   SHOULDER SURGERY     SKIN SPLIT GRAFT Right 09/19/2018   Procedure: Skin Graft Split Thickness;  Surgeon: Peggye Formillingham, Claire S, DO;  [facility];  Service: Plastics;  Laterality: Right;   SKIN SPLIT  GRAFT N/A 10/03/2018   Procedure: Split thickness skin graft to gluteal area with acell placement;  Surgeon: Peggye Formillingham, Claire S, DO;  [facility]: [facility];  Service: Plastics;  Laterality: N/A;  3 hours, please   VACUUM ASSISTED CLOSURE CHANGE N/A 07/12/2018   Procedure: ABDOMINAL VACUUM ASSISTED CLOSURE CHANGE and abdominal washout;  Surgeon: Violeta Gelinashompson, Burke, MD;  [facility];  Service: General;  Laterality: N/A;   WOUND DEBRIDEMENT Left 07/23/2018   Procedure: DEBRIDEMENT LEFT BUTTOCK  WOUND;  Surgeon: Violeta Gelinashompson, Burke, MD;  [facility];  Service: General;  Laterality: Left;   WOUND EXPLORATION Left 07/10/2018   Procedure: WOUND EXPLORATION LEFT GROIN;  Surgeon: Berna Bueonnor, Chelsea A, MD;  [facility]: [facility];  Service: General;  Laterality: Left;    Family History  Problem Relation Age of Onset   Breast cancer Mother        with mets to the bones    Medications- Reconciled discharge and current medications in Epic.  Current Outpatient Medications  Medication Sig Dispense Refill   Amino Acids-Protein Hydrolys (FEEDING SUPPLEMENT, PRO-STAT 64,) LIQD Take 30 mLs by mouth 3 (three) times daily with meals. 887 mL 0   Blood Pressure Monitoring (BLOOD PRESSURE CUFF) MISC Use daily as needed to check blood pressure. 1 each 0   busPIRone (BUSPAR) 5 MG tablet Take 2 tablets (10 mg total) by mouth 3 (three) times daily. for anxiety     ciprofloxacin (CIPRO) 500 MG tablet Take 1 tablet (500 mg total) by mouth 2 (two) times daily. 60 tablet 11   clonazePAM (KLONOPIN) 0.5 MG tablet Take 0.5 tablets (0.25 mg total) by mouth every 6 (six) hours as needed for anxiety. 60 tablet 5   dextromethorphan-guaiFENesin (MUCINEX DM) 30-600 MG 12hr tablet Take 2 tablets by mouth 2 (two) times daily.     dronabinol (MARINOL) 2.5 MG capsule TAKE 1 CAPSULE (2.5 MG TOTAL) BY MOUTH 2 (TWO) TIMES DAILY BEFORE A MEAL. 60 capsule 5   famotidine (PEPCID) 20 MG tablet Take 20 mg by mouth daily.     fentaNYL (DURAGESIC) 75 MCG/HR  Place 1 patch onto the skin every other day. 15 patch 0   gabapentin (NEURONTIN) 300 MG capsule Take 1 capsule (300 mg total) by mouth 3 (three)  times daily. 90 capsule 3   lidocaine (LIDODERM) 5 % PLACE 3 PATCHES ONTO THE SKIN EVERY 12 HOURS. REMOVE & DISCARD PATCH WITHIN 12 HOURS OR AS DIRECTED BY MD 90 patch 5   melatonin ([employer] MELATONIN) 3 MG TABS tablet Take 1 tablet (3 mg total) by mouth at bedtime. 10 tablet 0   methocarbamol (ROBAXIN) 500 MG tablet TAKE 1 TABLET BY MOUTH EVERY 6 HOURS AS NEEDED FOR MUSCLE SPASMS 100 tablet 11   mirabegron ER (MYRBETRIQ) 50 MG TB24 tablet Take 50 mg by mouth daily.     Multiple Vitamin (MULTIVITAMIN WITH MINERALS) TABS tablet Take 1 tablet by mouth daily.     naloxegol oxalate (MOVANTIK) 12.5 MG TABS tablet Take 1 tablet (12.5 mg total) by mouth daily. 30 tablet 11   naloxone (NARCAN) nasal spray 4 mg/0.1 mL To use if pt develops unconsciousness or confusion that family thinks is related to opioids. 1 each 3   oxybutynin (DITROPAN) 5 MG tablet Take 5 mg by mouth See admin instructions. Take 5 mg by mouth three times a day and an additional 5 mg once daily as needed for urinary urgency     oxyCODONE-acetaminophen (PERCOCET) 10-325 MG tablet Take 1 tablet by mouth every 4 (four) hours as needed (breakthrough pain). 180 tablet 0   PARoxetine (PAXIL) 40 MG tablet Take 1 tablet (40 mg total) by mouth at bedtime. 90 tablet 2   Probiotic Product (PROBIOTIC COLON SUPPORT) CAPS Take 1 capsule by mouth daily.     promethazine (PHENERGAN) 12.5 MG tablet TAKE 1 TABLET (12.5 MG TOTAL) BY MOUTH EVERY 6 (SIX) HOURS AS NEEDED FOR NAUSEA, VOMITING OR REFRACTORY NAUSEA / VOMITING. 90 tablet 5   senna-docusate (SENOKOT-S) 8.6-50 MG tablet Take 2 tablets by mouth 3 (three) times daily.     traZODone (DESYREL) 100 MG tablet TAKE 1 TO 2 TABLETS AT BEDTIME 180 tablet 1   vancomycin (VANCOCIN) 125 MG capsule Take 1 capsule (125 mg total) by mouth in the morning and at bedtime. 90  capsule 1   Vitamin D, Ergocalciferol, (DRISDOL) 1.25 MG (50000 UNIT) CAPS capsule Take 1 capsule (50,000 Units total) by mouth every 7 (seven) days. 5 capsule 0   albuterol (VENTOLIN HFA) 108 (90 Base) MCG/ACT inhaler INHALE 2 PUFFS BY MOUTH EVERY 6 HOURS AS NEEDED FOR WHEEZE OR SHORTNESS OF BREATH (WAITING ON PA) 18 each 1   No current facility-administered medications for this visit.    Allergies-reviewed and updated Allergies  Allergen Reactions   Methadone Other (See Comments)    Hallucinations/confusion    Social History   Socioeconomic History   Marital status: Married    Spouse name: Not on file   Number of children: Not on file   Years of education: Not on file   Highest education level: Not on file  Occupational History   Occupation: Disable  Tobacco Use   Smoking status: Former    Packs/day: 1.00    Years: 20.00    Pack years: 20.00    Types: Cigarettes    Quit date: 07/10/2018    Years since quitting: 2.4   Smokeless tobacco: Never  Vaping Use   Vaping Use: Never used  Substance and Sexual Activity   Alcohol use: Never   Drug use: Yes    Types: Oxycodone, Fentanyl    Comment: Fentanyl patch/oxycodone since 06/2018   Sexual activity: Yes  Other Topics Concern   Not on file  Social History Narrative   ** Merged History  Encounter **       Social Determinants of Health   Financial Resource Strain: Not on file  Food Insecurity: Not on file  Transportation Needs: Not on file  Physical Activity: Not on file  Stress: Not on file  Social Connections: Not on file        Objective:  Physical Exam: BP 111/78   Pulse 78   Temp (!) 97 F (36.1 C) (Temporal)   Ht 6\' 3"  (1.905 m)   SpO2 98%   BMI 19.81 kg/m   Gen: NAD, resting comfortably CV: RRR with no murmurs appreciated Pulm: NWOB, slight crackles noted on left lung base. GI: Normal bowel sounds present. Soft, Nontender, Nondistended. MSK: No edema, cyanosis, or clubbing noted Skin: Warm,  dry Neuro: Wheelchair-bound. Psych: Normal affect and thought content     I,Alexis Bryant,acting as a scribe for , MD.,have documented all relevant documentation on the behalf of Jacquiline Doe, MD,as directed by  Jacquiline Doe, MD while in the presence of Jacquiline Doe, MD.  I, Jacquiline Doe, MD, have reviewed all documentation for this visit. The documentation on 12/22/20 for the exam, diagnosis, procedures, and orders are all accurate and complete.  Time Spent: 45 minutes of total time was spent on the date of the encounter performing the following actions: chart review prior to seeing the patient including recent hospitalization, obtaining history, performing a medically necessary exam, counseling on the treatment plan, placing orders, and documenting in our EHR.    02/22/21. Katina Degree, MD 12/22/2020 12:50 PM

## 2020-12-22 NOTE — Assessment & Plan Note (Signed)
Related to his dysphagia.  Discussed importance of high protein and high calorie foods.

## 2020-12-22 NOTE — Assessment & Plan Note (Signed)
Following with PMR.  May benefit from switching Paxil to Cymbalta- he will discuss this with his physiatrist.

## 2020-12-22 NOTE — Assessment & Plan Note (Signed)
Follows with pulmonology.  Concern for possible aspiration and has speech referral pending.  Slight crackles noted on today's exam likely due to atelectasis.  We will be using his home flutter valve and incentive spirometer.

## 2020-12-22 NOTE — Patient Instructions (Signed)
It was very nice to see you today!  Please talk to your pain doctor about swoitching to cymbalta.  Please try your best to get high-calorie foods rich in protein and fat.  I will see back in 6 months.  Come back to see me sooner if needed.  Take care, Dr [name]  PLEASE NOTE:  If you had any lab tests please let us know if you have not heard back within a few days. You may see your results on mychart before we have a chance to review them but we will give you a call once they are reviewed by [place]. If we ordered any referrals today, please let us know if you have not heard from their office within the next week.   Please try these tips to maintain a healthy lifestyle:  Eat at least 3 REAL meals and 1-2 snacks per day.  Aim for no more than 5 hours between eating.  If you eat breakfast, please do so within one hour of getting up.   Each meal should contain half fruits/vegetables, one quarter protein, and one quarter carbs (no bigger than a computer mouse)  Cut down on sweet beverages. This includes juice, soda, and sweet tea.   Drink at least 1 glass of water with each meal and aim for at least 8 glasses per day  Exercise at least 150 minutes every week.

## 2020-12-22 NOTE — Assessment & Plan Note (Signed)
Has follow-up with GI pending.  Also has speech evaluation as an outpatient pending.

## 2020-12-22 NOTE — Assessment & Plan Note (Signed)
He is on Paxil 40 mg daily as well as Klonopin 0.25 mg every 6 hours as needed.  May benefit from switching Paxil to Cymbalta due to his chronic pain-will defer this to his physiatrist.

## 2020-12-23 MED ORDER — FENTANYL 75 MCG/HR TD PT72: 1.0000 | MEDICATED_PATCH | TRANSDERMAL | 0 refills | Status: DC

## 2020-12-23 MED ORDER — OXYCODONE HCL 5 MG PO TABS: 5.0000 mg | ORAL_TABLET | ORAL | 0 refills | Status: DC | PRN

## 2020-12-23 NOTE — Telephone Encounter (Signed)
  When sitting up EOB for 10-15 minutes- pain will skyrocket or when sits up in w/c for 30 minutes or more. Keeps going into screaming/pain episodes- 3-4x/day  Wounds look horrible now- since came home from hospital.  Says he's not eating enough to sustain himself- D/w PCP- Dr [name]- has GI doc 7/28.   Refusing a feeding tube. Pulmonary wants him to get, because not eating enough, as well having aspiration. Pt said tired of tubes.  Stomach also hurting-   Will try to go back to marinol- has some left- 2.5 mg- suggest every 2-3 days to try to help stimulate appetite- but not daily.   Increased Gabapentin to 600 mg TID- hasn't really noticed any difference with it- except was more sleepy initially-now less sleepy.   Will wait on Cymbalta since on Paxil- but will revisit it when GI issues doing better.   Will d/w Arline Asp if possible to get Actiq for pain spikes.  Will see if can get Approval for ER/Dr Phillips Odor.    Discussed Fentanyl patch and going up on dose.  Will wait on increasing since getting more confused.   Giving more Oxycodone- sometimes, will give 2 pills- of 10 mg sometimes at a time- ~ 2x/day.  Try 1.5 tabs instead of 20 mg at a time- to help with confusion.

## 2020-12-24 ENCOUNTER — Other Ambulatory Visit: Payer: Self-pay

## 2020-12-24 ENCOUNTER — Encounter: Payer: Self-pay | Admitting: Family

## 2020-12-24 ENCOUNTER — Telehealth (INDEPENDENT_AMBULATORY_CARE_PROVIDER_SITE_OTHER): Payer: PRIVATE HEALTH INSURANCE | Admitting: Family

## 2020-12-24 DIAGNOSIS — G894 Chronic pain syndrome: Secondary | ICD-10-CM | POA: Diagnosis not present

## 2020-12-24 DIAGNOSIS — R1310 Dysphagia, unspecified: Secondary | ICD-10-CM

## 2020-12-24 DIAGNOSIS — M8668 Other chronic osteomyelitis, other site: Secondary | ICD-10-CM

## 2020-12-24 NOTE — Assessment & Plan Note (Addendum)
Christopher [name] is having dysphasia and abdominal pain of unclear origin. Certainly the decrease in oral intake is contributing to his current decline. He has refused a feeding tube thus far. Discussed with Mrs. Mcclaren the critical role of nutrition and the increased risk of worsening if nutrition is not improved. Has upcoming appointment with GI.

## 2020-12-24 NOTE — Assessment & Plan Note (Signed)
Continue pain management per Dr. Berline Chough and Palliative Medicine team.

## 2020-12-24 NOTE — Assessment & Plan Note (Signed)
Christopher [name] continues to have episodes of excruciating pain with no new evidence of infection seen on imaging. Suspect this is likely multifactorial. I am concerned the Ciprofloxacin may slowly being overcome with the burden of the infection combined with his decreased nutrition which is allowing things to worsen. It is likely the pain that he is experiencing is related to his infection. We discussed that in an ideal situation he would have surgery to help control the infection, however in this particular situation I am not sure the risks of surgery would provide much benefit. We briefly discussed phage therapy and I will continue to discuss with Dr. Daiva Eves. In the interim will continue current dose of ciprofloxacin. I agree with Palliative Care consult to establish goals of care as I am concerned this will likely progress.

## 2020-12-24 NOTE — Progress Notes (Signed)
Subjective:    Patient ID: Christopher [name], male    DOB: 1966-06-11, 55 y.o.   MRN: [id number]  Chief Complaint  Patient presents with   Osteomyelitis       Virtual Visit via Telephone/Video Note   I connected with Christopher [name] on  12/24/2020 at 11:30 am by telephone and verified Christopher [name] identifiers and confirmed the DPR.    I discussed the limitations, risks, security and privacy concerns of performing an evaluation and management service by telephone and the availability of in person appointments. I also discussed with the patient that there may be a patient responsible charge related to this service. The patient expressed understanding and agreed to proceed.  Location:  Patient: Home Provider: Clinic   HPI:  Christopher [name] is a 55 y.o. male multiple pelvic fracture sustained from trauma and complicated by hardware associated osteomyelitis s/p hardware removal and excision of bone who was last seen on 10/27/20 with good adherence and tolerance to his antimicrobial regimen of ciprofloxacin for MDR Pseudomonas. Christopher [name] calls today with concern for Christopher [name] worsening.  Christopher [name] continues to follow with Christopher [name] in Physical Medicine and Rehabilitation. He was recently hospitalized with acute worsening of his pain. Imaging obtained showed no new infection that was identified. He was discharged with additional pain medications. Since that time he continues to have periods of excruciating pain and is having trouble with abdominal pain and eating. It was recommended a feeding tube at one point which Christopher [name] had declined. Has noticed increased drainage from the wounds which are also resulting in moisture related problems. No fevers or chills. Asks if something like phage therapy may be a consideration. Has concern for his well being as she feels he is progressively worsening. Have worked out to see Christopher [name] for Palliative Care evaluation  per Christopher [name].   Allergies  Allergen Reactions   Methadone Other (See Comments)    Hallucinations/confusion      Outpatient Medications Prior to Visit  Medication Sig Dispense Refill   albuterol (VENTOLIN HFA) 108 (90 Base) MCG/ACT inhaler INHALE 2 PUFFS BY MOUTH EVERY 6 HOURS AS NEEDED FOR WHEEZE OR SHORTNESS OF BREATH (WAITING ON PA) 18 each 1   Amino Acids-Protein Hydrolys (FEEDING SUPPLEMENT, PRO-STAT 64,) LIQD Take 30 mLs by mouth 3 (three) times daily with meals. 887 mL 0   Blood Pressure Monitoring (BLOOD PRESSURE CUFF) MISC Use daily as needed to check blood pressure. 1 each 0   busPIRone (BUSPAR) 5 MG tablet Take 2 tablets (10 mg total) by mouth 3 (three) times daily. for anxiety     ciprofloxacin (CIPRO) 500 MG tablet Take 1 tablet (500 mg total) by mouth 2 (two) times daily. 60 tablet 11   clonazePAM (KLONOPIN) 0.5 MG tablet Take 0.5 tablets (0.25 mg total) by mouth every 6 (six) hours as needed for anxiety. 60 tablet 5   dextromethorphan-guaiFENesin (MUCINEX DM) 30-600 MG 12hr tablet Take 2 tablets by mouth 2 (two) times daily.     dronabinol (MARINOL) 2.5 MG capsule TAKE 1 CAPSULE (2.5 MG TOTAL) BY MOUTH 2 (TWO) TIMES DAILY BEFORE A MEAL. 60 capsule 5   famotidine (PEPCID) 20 MG tablet Take 20 mg by mouth daily.     fentaNYL (DURAGESIC) 75 MCG/HR Place 1 patch onto the skin every other day. 15 patch 0   gabapentin (NEURONTIN) 300 MG capsule Take 1 capsule (300 mg total) by mouth 3 (three) times daily. 90 capsule 3  lidocaine (LIDODERM) 5 % PLACE 3 PATCHES ONTO THE SKIN EVERY 12 HOURS. REMOVE & DISCARD PATCH WITHIN 12 HOURS OR AS DIRECTED BY MD 90 patch 5   melatonin ([employer] MELATONIN) 3 MG TABS tablet Take 1 tablet (3 mg total) by mouth at bedtime. 10 tablet 0   methocarbamol (ROBAXIN) 500 MG tablet TAKE 1 TABLET BY MOUTH EVERY 6 HOURS AS NEEDED FOR MUSCLE SPASMS 100 tablet 11   mirabegron ER (MYRBETRIQ) 50 MG TB24 tablet Take 50 mg by mouth daily.     Multiple Vitamin  (MULTIVITAMIN WITH MINERALS) TABS tablet Take 1 tablet by mouth daily.     naloxegol oxalate (MOVANTIK) 12.5 MG TABS tablet Take 1 tablet (12.5 mg total) by mouth daily. 30 tablet 11   naloxone (NARCAN) nasal spray 4 mg/0.1 mL To use if pt develops unconsciousness or confusion that family thinks is related to opioids. 1 each 3   oxybutynin (DITROPAN) 5 MG tablet Take 5 mg by mouth See admin instructions. Take 5 mg by mouth three times a day and an additional 5 mg once daily as needed for urinary urgency     oxyCODONE (ROXICODONE) 5 MG immediate release tablet Take 1 tablet (5 mg total) by mouth every 3 (three) hours as needed for severe pain. 180 tablet 0   oxyCODONE-acetaminophen (PERCOCET) 10-325 MG tablet Take 1 tablet by mouth every 4 (four) hours as needed (breakthrough pain). 180 tablet 0   PARoxetine (PAXIL) 40 MG tablet Take 1 tablet (40 mg total) by mouth at bedtime. 90 tablet 2   Probiotic Product (PROBIOTIC COLON SUPPORT) CAPS Take 1 capsule by mouth daily.     promethazine (PHENERGAN) 12.5 MG tablet TAKE 1 TABLET (12.5 MG TOTAL) BY MOUTH EVERY 6 (SIX) HOURS AS NEEDED FOR NAUSEA, VOMITING OR REFRACTORY NAUSEA / VOMITING. 90 tablet 5   senna-docusate (SENOKOT-S) 8.6-50 MG tablet Take 2 tablets by mouth 3 (three) times daily.     traZODone (DESYREL) 100 MG tablet TAKE 1 TO 2 TABLETS AT BEDTIME 180 tablet 1   vancomycin (VANCOCIN) 125 MG capsule Take 1 capsule (125 mg total) by mouth in the morning and at bedtime. 90 capsule 1   Vitamin D, Ergocalciferol, (DRISDOL) 1.25 MG (50000 UNIT) CAPS capsule Take 1 capsule (50,000 Units total) by mouth every 7 (seven) days. 5 capsule 0   No facility-administered medications prior to visit.     Past Medical History:  Diagnosis Date   Acute on chronic respiratory failure with hypoxia (HCC) 06/2018   trach removed 11-16-2018, on vent from jan until may 2020 - uses albuterol prn   Anxiety    Bacteremia due to Pseudomonas 06/2018   Chronic  osteomyelitis (HCC)    Chronic pain syndrome    Clostridium difficile colitis 10/30/2019   tx with abx    Depression    DVT (deep venous thrombosis) (HCC) 2020   right brachial post PICC line   History of blood transfusion 06/2018   History of Clostridioides difficile colitis    History of kidney stones    Hypertension    norvasc d/c by pcp on 11/05/19   Multiple traumatic injuries    Penile pain 11/18/2019   Pneumonia 11/2009   2020 x 2   Walker as ambulation aid    Wheelchair bound    electric   Wound discharge    left hip wound with bloody/clear drainage change dressing q day surgilube with gauze, between legs wound using calcium algenate pad bid  Past Surgical History:  Procedure Laterality Date   APPLICATION OF A-CELL OF BACK N/A 08/06/2018   Procedure: Application Of A-Cell Of Back;  Surgeon: Peggye Form, DO;  Location: [facility] OR;  Service: Plastics;  Laterality: N/A;   APPLICATION OF A-CELL OF EXTREMITY Left 08/06/2018   Procedure: Application Of A-Cell Of Extremity;  Surgeon: Peggye Form, DO;  Location: [facility] OR;  Service: Plastics;  Laterality: Left;   APPLICATION OF A-CELL OF EXTREMITY Left 09/18/2019   Procedure: APPLICATION OF A-CELL OF EXTREMITY;  Surgeon: Peggye Form, DO;  Location: [facility] OR;  Service: Plastics;  Laterality: Left;   APPLICATION OF WOUND VAC  07/12/2018   Procedure: Application Of Wound Vac to the Left Thigh and Scrotum.;  Surgeon: Roby Lofts, MD;  Location: [facility] OR;  Service: Orthopedics;;   APPLICATION OF WOUND VAC  07/10/2018   Procedure: Application Of Wound Vac;  Surgeon: Berna Bue, MD;  Location: [facility] OR;  Service: General;;   COLON SURGERY  2020   colostomy   COLOSTOMY N/A 07/23/2018   Procedure: COLOSTOMY;  Surgeon: Violeta Gelinas, MD;  Location: [facility] OR;  Service: General;  Laterality: N/A;   CYSTOSCOPY W/ URETERAL STENT PLACEMENT N/A 07/15/2018   Procedure: RETROGRADE URETHROGRAM;  Surgeon: Marcine Matar, MD;   Location[facility];  Service: Urology;  Laterality: N/A;   CYSTOSCOPY WITH LITHOLAPAXY N/A 05/06/2019   Procedure: CYSTOSCOPY BASKET BLADDER STONE EXTRACTION;  Surgeon: Malen Gauze, MD;  Location: [facility];  Service: Urology;  Laterality: N/A;  30 MINS   CYSTOSTOMY N/A 05/06/2019   Procedure: REPLACEMENT OF SUPRAPUBIC CATHETER;  Surgeon: Malen Gauze, MD;  Location: [facility];  Service: Urology;  Laterality: N/A;   DEBRIDEMENT AND CLOSURE WOUND Left 03/04/2019   Procedure: Excision of hip wound with placement of Acell;  Surgeon: Peggye Form, DO;  [facility]: [facility];  Service: Plastics;  Laterality: Left;   ESOPHAGOGASTRODUODENOSCOPY N/A 08/14/2018   Procedure: ESOPHAGOGASTRODUODENOSCOPY (EGD);  Surgeon: Violeta Gelinas, MD;  Location: [facility] ENDOSCOPY;  Service: General;  Laterality: N/A;  bedside   FACIAL RECONSTRUCTION SURGERY     X 2--once as a teenager and second time in his 32's   HARDWARE REMOVAL Left 03/04/2019   Procedure: Left Hip Hardware Removal;  Surgeon: Roby Lofts, MD;  [facility]: [facility];  Service: Orthopedics;  Laterality: Left;   HIP PINNING,CANNULATED Left 07/12/2018   Procedure: CANNULATED HIP PINNING;  Surgeon: Roby Lofts, MD;  [facility];  Service: Orthopedics;  Laterality: Left;   HIP SURGERY     HOLMIUM LASER APPLICATION Right 07/15/2019   Procedure: HOLMIUM LASER APPLICATION;  Surgeon: Malen Gauze, MD;  Location: [facility];  Service: Urology;  Laterality: Right;   I & D EXTREMITY Left 07/25/2018   Procedure: Debridement of buttock, scrotum and left leg, placement of acell and vac;  Surgeon: Peggye Form, DO;  [facility]: [facility];  Service: Plastics;  Laterality: Left;   I & D EXTREMITY N/A 08/06/2018   Procedure: Debridement of buttock, scrotum and left leg;  Surgeon: Peggye Form, DO;  [facility];  Service: Plastics;  Laterality: N/A;   I & D EXTREMITY N/A 08/13/2018    Procedure: Debridement of buttock, scrotum and left leg, placement of acell and vac;  Surgeon: Peggye Form, DO;  [facility];  Service: Plastics;  Laterality: N/A;  90 min, please   INCISION AND DRAINAGE HIP Left 09/18/2019  Procedure: IRRIGATION AND DEBRIDEMENT HIP/ PELVIS WITH WOUND VAC PLACEMENT;  Surgeon: Roby Lofts, MD;  [facility];  Service: Orthopedics;  Laterality: Left;   INCISION AND DRAINAGE OF WOUND N/A 07/18/2018   Procedure: Debridement of left leg, buttocks and scrotal wound with placement of acell and Flexiseal;  Surgeon: Peggye Form, DO;  [facility]: [facility];  Service: Plastics;  Laterality: N/A;   INCISION AND DRAINAGE OF WOUND Left 08/29/2018   Procedure: Debridement of buttock, scrotum and left leg, placement of acell and vac;  Surgeon: Peggye Form, DO;  [facility]: [facility];  Service: Plastics;  Laterality: Left;  75 min, please   INCISION AND DRAINAGE OF WOUND Bilateral 10/23/2018   Procedure: DEBRIDEMENT OF BUTTOCK,SCROTUM, AND LEG WOUNDS WITH PLACEMENT OF ACELL- BILATERAL 90 MIN;  Surgeon: Peggye Form, DO;  Location: [facility];  Service: Plastics;  Laterality: Bilateral;   IR ANGIOGRAM PELVIS SELECTIVE OR SUPRASELECTIVE  07/10/2018   IR ANGIOGRAM PELVIS SELECTIVE OR SUPRASELECTIVE  07/10/2018   IR ANGIOGRAM SELECTIVE EACH ADDITIONAL VESSEL  07/10/2018   IR EMBO ART  VEN HEMORR LYMPH EXTRAV  INC GUIDE ROADMAPPING  07/10/2018   IR NEPHROSTOMY PLACEMENT LEFT  04/05/2019   IR NEPHROSTOMY PLACEMENT RIGHT  05/31/2019   IR US GUIDE BX ASP/DRAIN  07/10/2018   IR US GUIDE VASC ACCESS RIGHT  07/10/2018   IR VENO/EXT/UNI LEFT  07/10/2018   IRRIGATION AND DEBRIDEMENT OF WOUND WITH SPLIT THICKNESS SKIN GRAFT Left 09/19/2018   Procedure: Debridement of gluteal wound with placement of acell to left leg.;  Surgeon: Peggye Form, DO;  [facility]: [facility];  Service: Plastics;  Laterality: Left;  2.5 hours, please   LAPAROTOMY N/A 07/12/2018   Procedure:  EXPLORATORY LAPAROTOMY;  Surgeon: Violeta Gelinas, MD;  Location: [facility] OR;  Service: General;  Laterality: N/A;   LAPAROTOMY N/A 07/15/2018   Procedure: WOUND EXPLORATION; CLOSURE OF ABDOMEN;  Surgeon: Violeta Gelinas, MD;  Location: [facility] OR;  Service: General;  Laterality: N/A;   LAPAROTOMY  07/10/2018   Procedure: Exploratory Laparotomy;  Surgeon: Berna Bue, MD;  Location: [facility] OR;  Service: General;;   MASS EXCISION Left 09/18/2019   Procedure: EXCISION UPPER LEFT INNER THIGH WOUND;  Surgeon: Peggye Form, DO;  [facility];  Service: Plastics;  Laterality: Left;   NEPHROLITHOTOMY Right 07/15/2019   Procedure: NEPHROLITHOTOMY PERCUTANEOUS;  Surgeon: Malen Gauze, MD;  Location: [facility];  Service: Urology;  Laterality: Right;  90 MINS   PEG PLACEMENT N/A 08/14/2018   Procedure: PERCUTANEOUS ENDOSCOPIC GASTROSTOMY (PEG) PLACEMENT;  Surgeon: Violeta Gelinas, MD;  Location: [facility] ENDOSCOPY;  Service: General;  Laterality: N/A;   PERCUTANEOUS TRACHEOSTOMY N/A 08/02/2018   Procedure: PERCUTANEOUS TRACHEOSTOMY;  Surgeon: Violeta Gelinas, MD;  Location: [facility] OR;  Service: General;  Laterality: N/A;   RADIOLOGY WITH ANESTHESIA N/A 07/10/2018   Procedure: IR WITH ANESTHESIA;  Surgeon: Simonne Come, MD;  Location: [facility] OR;  Service: Radiology;  Laterality: N/A;   RADIOLOGY WITH ANESTHESIA Right 07/10/2018   Procedure: Ir With Anesthesia;  Surgeon: Simonne Come, MD;  Location: [facility] OR;  Service: Radiology;  Laterality: Right;   SCROTAL EXPLORATION N/A 07/15/2018   Procedure: SCROTUM DEBRIDEMENT;  Surgeon: Marcine Matar, MD;  Location: [facility] OR;  Service: Urology;  Laterality: N/A;   SHOULDER SURGERY     SKIN SPLIT GRAFT Right 09/19/2018   Procedure: Skin Graft Split Thickness;  Surgeon: Peggye Form, DO;  Location: [facility];  Service: Plastics;  Laterality:  Right;   SKIN SPLIT GRAFT N/A 10/03/2018   Procedure: Split thickness skin graft to gluteal area with acell placement;  Surgeon:  Peggye Formillingham, Claire S, DO;  Location: [facility];  Service: Plastics;  Laterality: N/A;  3 hours, please   VACUUM ASSISTED CLOSURE CHANGE N/A 07/12/2018   Procedure: ABDOMINAL VACUUM ASSISTED CLOSURE CHANGE and abdominal washout;  Surgeon: Violeta Gelinashompson, Burke, MD;  [facility];  Service: General;  Laterality: N/A;   WOUND DEBRIDEMENT Left 07/23/2018   Procedure: DEBRIDEMENT LEFT BUTTOCK  WOUND;  Surgeon: Violeta Gelinashompson, Burke, MD;  [facility];  Service: General;  Laterality: Left;   WOUND EXPLORATION Left 07/10/2018   Procedure: WOUND EXPLORATION LEFT GROIN;  Surgeon: Berna Bueonnor, Chelsea A, MD;  [facility];  Service: General;  Laterality: Left;       Review of Systems  Unable to perform ROS: Acuity of condition     Objective:    Nursing note and vital signs reviewed.   Physical exam limited secondary to telehealth visit.  Assessment & Plan:   Problem List Items Addressed This Visit       Digestive   Dysphagia    Christopher [name] is having dysphasia and abdominal pain of unclear origin. Certainly the decrease in oral intake is contributing to his current decline. He has refused a feeding tube thus far. Discussed with Mrs. Peter the critical role of nutrition and the increased risk of worsening if nutrition is not improved. Has upcoming appointment with GI.          Musculoskeletal and Integument   Chronic osteomyelitis of hip [facility](HCC)    Christopher [name] continues to have episodes of excruciating pain with no new evidence of infection seen on imaging. Suspect this is likely multifactorial. I am concerned the Ciprofloxacin may slowly being overcome with the burden of the infection combined with his decreased nutrition which is allowing things to worsen. It is likely the pain that he is experiencing is related to his infection. We discussed that in an ideal situation he would have surgery to help control the infection, however in this particular situation I am not sure the risks of surgery would  provide much benefit. We briefly discussed phage therapy and I will continue to discuss with Dr. Daiva EvesVan Dam. In the interim will continue current dose of ciprofloxacin. I agree with Palliative Care consult to establish goals of care as I am concerned this will likely progress.          Other   Chronic pain syndrome    Continue pain management per Dr. Berline ChoughLovorn and Palliative Medicine team.          I am having [name] B. Padgett maintain his multivitamin with minerals, dronabinol, melatonin, Blood Pressure Cuff, mirabegron ER, oxybutynin, naloxone, dextromethorphan-guaiFENesin, Probiotic Colon Support, ciprofloxacin, lidocaine, senna-docusate, famotidine, feeding supplement (PRO-STAT 64), naloxegol oxalate, clonazePAM, promethazine, oxyCODONE-acetaminophen, busPIRone, vancomycin, Vitamin D (Ergocalciferol), PARoxetine, methocarbamol, gabapentin, traZODone, albuterol, fentaNYL, and oxyCODONE.   No orders of the defined types were placed in this encounter.    I discussed the assessment and treatment plan with the patient. The patient was provided an opportunity to ask questions and all were answered. The patient agreed with the plan and demonstrated an understanding of the instructions.   The patient was advised to call back or seek an in-person evaluation if the symptoms worsen or if the condition fails to improve as anticipated.   I provided  30  minutes of non-face-to-face time during this encounter.  Follow-up: As  previously scheduled or sooner if needed.    Marcos Eke, MSN, FNP-C Nurse Practitioner [facility] Main number: [phone]

## 2021-01-01 ENCOUNTER — Encounter
Payer: No Typology Code available for payment source | Attending: Physical Medicine and Rehabilitation | Admitting: Physical Medicine and Rehabilitation

## 2021-01-01 ENCOUNTER — Other Ambulatory Visit: Payer: Self-pay

## 2021-01-01 ENCOUNTER — Encounter: Payer: Self-pay | Admitting: Physical Medicine and Rehabilitation

## 2021-01-01 VITALS — BP 111/76 | HR 85 | Temp 98.2°F

## 2021-01-01 DIAGNOSIS — Z993 Dependence on wheelchair: Secondary | ICD-10-CM | POA: Insufficient documentation

## 2021-01-01 DIAGNOSIS — E43 Unspecified severe protein-calorie malnutrition: Secondary | ICD-10-CM | POA: Diagnosis present

## 2021-01-01 DIAGNOSIS — M792 Neuralgia and neuritis, unspecified: Secondary | ICD-10-CM | POA: Insufficient documentation

## 2021-01-01 DIAGNOSIS — G8921 Chronic pain due to trauma: Secondary | ICD-10-CM | POA: Insufficient documentation

## 2021-01-01 DIAGNOSIS — M8668 Other chronic osteomyelitis, other site: Secondary | ICD-10-CM | POA: Diagnosis present

## 2021-01-01 MED ORDER — ARIPIPRAZOLE 2 MG PO TABS: 2.0000 mg | ORAL_TABLET | Freq: Every day | ORAL | 5 refills | Status: DC

## 2021-01-01 MED ORDER — FAMOTIDINE 40 MG PO TABS: 40.0000 mg | ORAL_TABLET | Freq: Two times a day (BID) | ORAL | 5 refills | Status: DC

## 2021-01-01 MED ORDER — HYDROMORPHONE HCL 4 MG PO TABS: 4.0000 mg | ORAL_TABLET | ORAL | 0 refills | Status: AC | PRN

## 2021-01-01 NOTE — Patient Instructions (Signed)
Pt is a 55 yr old male with hx of Multitrauma- causing L femoral neck fx, degloving of L hip to going/scrotum, bladder neck trauma- got SPC, developed compartment syndrome -s/p surgery for that; also diverting colostomy, skin grafts, and s/p trach and PEG- PEG is out. 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 S/P screw removal and on IV ABX for L hip osteomyelitis. 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  B/L tennis elbow new-and B/L pending/forming ulnar neuropathy Osteomyelitis of L hip found again-on  PO vanc and Cipro    Here for f/u on chronic pain.  Due to stomach pain after taking in a few bites, will try Pepcid 40 mg BID- since cannot take a PPI due to PO ABX he's on.  Cannot do Actiq since need a specific training course to take.  Don't give Dilaudid and Oxycodone together -the 2 short acting pain meds.  Dilaudid 4-6 mg qFOUR hours as needed for pain- try the 4 mg first and can use up to 6 mg every 4 hours as needed- replace the Oxycodone 10-20 mg with this dose. And will see if needs to try this dose or 8 mg? Needs to take pain meds AFTER eating something- don't take on empty stomach- will cause abdominal pain if takes on empty stomach.  Reduced Gabapentin to 300 mg TID since caused increased confusion at higher doses. Discussed NEEDS J tube or PEG- actually suggest if possible see if can get tube to feed to upper part of intestine;  Will help him get more nutrition and also bypass the abd pain when he eats- and helps abd pain.  Pt scared/embarrassed about this- Suggest getting a mickey connection so won't be noticeable.  Will add Abilify 2 mg QHS/nightly- for adjuvant for mood-  When needs Marinol, let me know- really needs it for severe malnutrition.  Just refilled Duragesic last week, but not due yet when refilled- still hasn't gotten refilled- so  has 1 month supply- will refill 7/27 and will last 1 month.  Needs a rail extension to help keep him in bed.  Take pictures of wounds so I can see how they are going.  Have texted Dr Phillips Odor to ask about pain regimen- haven't heard back. Ok'd per pt.  F/U in 6-8 weeks- double appointment-

## 2021-01-01 NOTE — Progress Notes (Signed)
Subjective:    Patient ID: Christopher [name], male    DOB: [birth year], 55 y.o.   MRN: [id number]  HPI Pt is a 55 yr old male with hx of Multitrauma- causing L femoral neck fx, degloving of L hip to going/scrotum, bladder neck trauma- got SPC, developed compartment syndrome -s/p surgery for that; also diverting colostomy, skin grafts, and s/p trach and PEG- PEG is out. 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 S/P screw removal and on IV ABX for L hip osteomyelitis. 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  B/L tennis elbow new-and B/L pending/forming ulnar neuropathy Osteomyelitis of L hip found again-on  PO vanc and Cipro    Here for f/u on chronic pain.   Today is a bad day/bad week.   Trying to eat as much as he can, but stomach starts hurting "real bad"- after a couple of bites.   Is taking Marinol "here and there".  In his brain, has a "block" about it- so doesn't want to take- wife still giving it.   GI appt 7/28- for abd pain, reflux, etc.  Wounds are looking more crappy, because of poor nutrition.   Saw Pulmonary- they want him to do Flutter valve- want him to stay on O2- drops whenever  tries to wean O2.   Feeling nauseated- spitting into "bowl"- but not vomit.  Phenergan- getting at least 2x/day. 12.5 mg at a time.   Bowels doing OK/fine.    Got too confused on increased dose of gabapentin 600 mg TID- reduced back down to 300 mg TID.  Sleeping better with Trazodone- doing 200 mg nightly.  Fighting keeping legs on bed- legs keeps going off the edge of bed on Right side-   Crying constantly-big open gulp of air- so stomach full of air- burps a lot.     Pain Inventory Average Pain 10 Pain Right Now 10 My pain is constant, sharp, dull, stabbing, tingling, and aching  LOCATION OF PAIN  back, buttocks, hip  BOWEL Number of  stools per week: na Oral laxative use Yes  Type of laxative miralax, senna, movantik Enema or suppository use No  History of colostomy Yes  Incontinent No   BLADDER Suprapubic In and out cath, frequency  Able to self cath  suprapubic Bladder incontinence No  Frequent urination No  Leakage with coughing No  Difficulty starting stream No  Incomplete bladder emptying No    Mobility ability to climb steps?  no do you drive?  no use a wheelchair needs help with transfers  Function disabled: date disabled . I need assistance with the following:  feeding, dressing, bathing, toileting, meal prep, household duties, and shopping  Neuro/Psych bladder control problems bowel control problems weakness numbness tremor tingling trouble walking spasms dizziness confusion depression anxiety  Prior Studies Any changes since last visit?  no  Physicians involved in your care Any changes since last visit?  no   Family History  Problem Relation Age of Onset   Breast cancer Mother        with mets to the bones   Social History   Socioeconomic History   Marital status: Married    Spouse name: Not on file   Number of children: Not on file   Years of education: Not on file   Highest education level: Not on file  Occupational History   Occupation: Disable  Tobacco Use   Smoking status: Former    Packs/day: 1.00    Years: 20.00    Pack years: 20.00    Types: Cigarettes    Quit date: 07/10/2018    Years since quitting: 2.4   Smokeless tobacco: Never  Vaping Use   Vaping Use: Never used  Substance and Sexual Activity   Alcohol use: Never   Drug use: Yes    Types: Oxycodone, Fentanyl    Comment: Fentanyl patch/oxycodone since 06/2018   Sexual activity: Yes  Other Topics Concern   Not on file  Social History Narrative   ** Merged History Encounter **       Social Determinants of Health   Financial Resource Strain: Not on file  Food Insecurity: Not on file   Transportation Needs: Not on file  Physical Activity: Not on file  Stress: Not on file  Social Connections: Not on file   Past Surgical History:  Procedure Laterality Date   APPLICATION OF A-CELL OF BACK N/A 08/06/2018   Procedure: Application Of A-Cell Of Back;  Surgeon: Peggye Form, DO;  Location: [facility];  Service: Plastics;  Laterality: N/A;   APPLICATION OF A-CELL OF EXTREMITY Left 08/06/2018   Procedure: Application Of A-Cell Of Extremity;  Surgeon: Peggye Form, DO;  Location: [facility] OR;  Service: Plastics;  Laterality: Left;   APPLICATION OF A-CELL OF EXTREMITY Left 09/18/2019   Procedure: APPLICATION OF A-CELL OF EXTREMITY;  Surgeon: Peggye Form, DO;  Location: [facility];  Service: Plastics;  Laterality: Left;   APPLICATION OF WOUND VAC  07/12/2018   Procedure: Application Of Wound Vac to the Left Thigh and Scrotum.;  Surgeon: Roby Lofts, MD;  Location: [facility] OR;  Service: Orthopedics;;   APPLICATION OF WOUND VAC  07/10/2018   Procedure: Application Of Wound Vac;  Surgeon: Berna Bue, MD;  Location: [facility] OR;  Service: General;;   COLON SURGERY  2020   colostomy   COLOSTOMY N/A 07/23/2018   Procedure: COLOSTOMY;  Surgeon: Violeta Gelinas, MD;  Location: [facility] OR;  Service: General;  Laterality: N/A;   CYSTOSCOPY W/ URETERAL STENT PLACEMENT N/A 07/15/2018   Procedure: RETROGRADE URETHROGRAM;  Surgeon: Marcine Matar, MD;  Location[facility] OR;  Service: Urology;  Laterality: N/A;   CYSTOSCOPY WITH LITHOLAPAXY N/A 05/06/2019   Procedure: CYSTOSCOPY BASKET BLADDER STONE EXTRACTION;  Surgeon: Malen Gauze, MD;  Location: [facility];  Service: Urology;  Laterality: N/A;  30 MINS   CYSTOSTOMY N/A 05/06/2019   Procedure: REPLACEMENT OF SUPRAPUBIC CATHETER;  Surgeon: Malen Gauze, MD;  Location: [facility];  Service: Urology;  Laterality: N/A;   DEBRIDEMENT AND CLOSURE WOUND Left 03/04/2019   Procedure: Excision of hip wound with  placement of Acell;  Surgeon: Peggye Form, DO;  [facility]: [facility];  Service: Plastics;  Laterality: Left;   ESOPHAGOGASTRODUODENOSCOPY N/A 08/14/2018   Procedure: ESOPHAGOGASTRODUODENOSCOPY (EGD);  Surgeon: Violeta Gelinas, MD;  Location: [facility] ENDOSCOPY;  Service: General;  Laterality: N/A;  bedside   FACIAL RECONSTRUCTION SURGERY     X 2--once as a teenager and second time in his 54's   HARDWARE REMOVAL Left 03/04/2019   Procedure: Left Hip Hardware Removal;  Surgeon: Roby Lofts, MD;  [facility]: [facility];  Service: Orthopedics;  Laterality: Left;   HIP PINNING,CANNULATED Left 07/12/2018   Procedure: CANNULATED HIP PINNING;  Surgeon: Roby Lofts, MD;  [facility];  Service: Orthopedics;  Laterality: Left;   HIP SURGERY     HOLMIUM LASER APPLICATION Right 07/15/2019   Procedure: HOLMIUM LASER APPLICATION;  Surgeon: Malen Gauze, MD;  Location: [facility];  Service: Urology;  Laterality: Right;   I & D EXTREMITY Left 07/25/2018   Procedure: Debridement of buttock, scrotum and left leg, placement of acell and vac;  Surgeon: Peggye Form, DO;  [facility]: [facility];  Service: Plastics;  Laterality: Left;   I & D EXTREMITY N/A 08/06/2018   Procedure: Debridement of buttock, scrotum and left leg;  Surgeon: Peggye Form, DO;  [facility];  Service: Plastics;  Laterality: N/A;   I & D EXTREMITY N/A 08/13/2018   Procedure: Debridement of buttock, scrotum and left leg, placement of acell and vac;  Surgeon: Peggye Form, DO;  [facility]: [facility];  Service: Plastics;  Laterality: N/A;  90 min, please   INCISION AND DRAINAGE HIP Left 09/18/2019   Procedure: IRRIGATION AND DEBRIDEMENT HIP/ PELVIS WITH WOUND VAC PLACEMENT;  Surgeon: Roby Lofts, MD;  [facility];  Service: Orthopedics;  Laterality: Left;   INCISION AND DRAINAGE OF WOUND N/A 07/18/2018   Procedure: Debridement of left leg, buttocks and scrotal wound with placement of acell and Flexiseal;   Surgeon: Peggye Form, DO;  [facility]: [facility];  Service: Plastics;  Laterality: N/A;   INCISION AND DRAINAGE OF WOUND Left 08/29/2018   Procedure: Debridement of buttock, scrotum and left leg, placement of acell and vac;  Surgeon: Peggye Form, DO;  Location: [facility];  Service: Plastics;  Laterality: Left;  75 min, please   INCISION AND DRAINAGE OF WOUND Bilateral 10/23/2018   Procedure: DEBRIDEMENT OF BUTTOCK,SCROTUM, AND LEG WOUNDS WITH PLACEMENT OF ACELL- BILATERAL 90 MIN;  Surgeon: Peggye Form, DO;  [facility]: [facility];  Service: Plastics;  Laterality: Bilateral;   IR ANGIOGRAM PELVIS SELECTIVE OR SUPRASELECTIVE  07/10/2018   IR ANGIOGRAM PELVIS SELECTIVE OR SUPRASELECTIVE  07/10/2018   IR ANGIOGRAM SELECTIVE EACH ADDITIONAL VESSEL  07/10/2018   IR EMBO ART  VEN HEMORR LYMPH EXTRAV  INC GUIDE ROADMAPPING  07/10/2018   IR NEPHROSTOMY PLACEMENT LEFT  04/05/2019   IR NEPHROSTOMY PLACEMENT RIGHT  05/31/2019   IR US GUIDE BX ASP/DRAIN  07/10/2018   IR US GUIDE VASC ACCESS RIGHT  07/10/2018   IR VENO/EXT/UNI LEFT  07/10/2018   IRRIGATION AND DEBRIDEMENT OF WOUND WITH SPLIT THICKNESS SKIN GRAFT Left 09/19/2018   Procedure: Debridement of gluteal wound with placement of acell to left leg.;  Surgeon: Peggye Form, DO;  Location: [facility];  Service: Plastics;  Laterality: Left;  2.5 hours, please   LAPAROTOMY N/A 07/12/2018   Procedure: EXPLORATORY LAPAROTOMY;  Surgeon: Violeta Gelinas, MD;  Location: [facility] OR;  Service: General;  Laterality: N/A;   LAPAROTOMY N/A 07/15/2018   Procedure: WOUND EXPLORATION; CLOSURE OF ABDOMEN;  Surgeon: Violeta Gelinas, MD;  Location: [facility] OR;  Service: General;  Laterality: N/A;   LAPAROTOMY  07/10/2018   Procedure: Exploratory Laparotomy;  Surgeon: Berna Bue, MD;  Location: [facility] OR;  Service: General;;   MASS EXCISION Left 09/18/2019   Procedure: EXCISION UPPER LEFT INNER THIGH WOUND;  Surgeon: Peggye Form, DO;  [facility];  Service: Plastics;   Laterality: Left;   NEPHROLITHOTOMY Right 07/15/2019   Procedure: NEPHROLITHOTOMY PERCUTANEOUS;  Surgeon: Malen Gauze, MD;  Location: [facility];  Service: Urology;  Laterality: Right;  90 MINS   PEG PLACEMENT N/A 08/14/2018  Procedure: PERCUTANEOUS ENDOSCOPIC GASTROSTOMY (PEG) PLACEMENT;  Surgeon: Violeta Gelinas, MD;  Location: [facility] ENDOSCOPY;  Service: General;  Laterality: N/A;   PERCUTANEOUS TRACHEOSTOMY N/A 08/02/2018   Procedure: PERCUTANEOUS TRACHEOSTOMY;  Surgeon: Violeta Gelinas, MD;  Location: [facility] OR;  Service: General;  Laterality: N/A;   RADIOLOGY WITH ANESTHESIA N/A 07/10/2018   Procedure: IR WITH ANESTHESIA;  Surgeon: Simonne Come, MD;  Location: [facility] OR;  Service: Radiology;  Laterality: N/A;   RADIOLOGY WITH ANESTHESIA Right 07/10/2018   Procedure: Ir With Anesthesia;  Surgeon: Simonne Come, MD;  Location: [facility] OR;  Service: Radiology;  Laterality: Right;   SCROTAL EXPLORATION N/A 07/15/2018   Procedure: SCROTUM DEBRIDEMENT;  Surgeon: Marcine Matar, MD;  Location: [facility] OR;  Service: Urology;  Laterality: N/A;   SHOULDER SURGERY     SKIN SPLIT GRAFT Right 09/19/2018   Procedure: Skin Graft Split Thickness;  Surgeon: Peggye Form, DO;  Location[facility];  Service: Plastics;  Laterality: Right;   SKIN SPLIT GRAFT N/A 10/03/2018   Procedure: Split thickness skin graft to gluteal area with acell placement;  Surgeon: Peggye Form, DO;  Location: [facility] OR;  Service: Plastics;  Laterality: N/A;  3 hours, please   VACUUM ASSISTED CLOSURE CHANGE N/A 07/12/2018   Procedure: ABDOMINAL VACUUM ASSISTED CLOSURE CHANGE and abdominal washout;  Surgeon: Violeta Gelinas, MD;  Location: [facility] OR;  Service: General;  Laterality: N/A;   WOUND DEBRIDEMENT Left 07/23/2018   Procedure: DEBRIDEMENT LEFT BUTTOCK  WOUND;  Surgeon: Violeta Gelinas, MD;  Location: [facility] OR;  Service: General;  Laterality: Left;   WOUND EXPLORATION Left 07/10/2018   Procedure: WOUND EXPLORATION LEFT GROIN;   Surgeon: Berna Bue, MD;  Location: [facility] OR;  Service: General;  Laterality: Left;   Past Medical History:  Diagnosis Date   Acute on chronic respiratory failure with hypoxia (HCC) 06/2018   trach removed 11-16-2018, on vent from jan until may 2020 - uses albuterol prn   Anxiety    Bacteremia due to Pseudomonas 06/2018   Chronic osteomyelitis (HCC)    Chronic pain syndrome    Clostridium difficile colitis 10/30/2019   tx with abx    Depression    DVT (deep venous thrombosis) (HCC) 2020   right brachial post PICC line   History of blood transfusion 06/2018   History of Clostridioides difficile colitis    History of kidney stones    Hypertension    norvasc d/c by pcp on 11/05/19   Multiple traumatic injuries    Penile pain 11/18/2019   Pneumonia 11/2009   2020 x 2   Walker as ambulation aid    Wheelchair bound    electric   Wound discharge    left hip wound with bloody/clear drainage change dressing q day surgilube with gauze, between legs wound using calcium algenate pad bid   BP 111/76 (BP Location: Left Arm, Patient Position: Sitting, Cuff Size: Normal)   Pulse 85   Temp 98.2 F (36.8 C) (Oral)   SpO2 95%   Opioid Risk Score:   Fall Risk Score:  `1  Depression screen PHQ 2/9  Depression screen [facility] 2/9 01/01/2021 09/02/2020 07/02/2020 03/17/2020 12/30/2019 12/18/2019  Decreased Interest 0 0 0  Down, Depressed, Hopeless 0 0  PHQ - 2 Score 0 0  Altered sleeping - - 3 - - -  Tired, decreased energy - - 3 - - -  Change in appetite - - 3 - - -  Feeling bad or failure about yourself  3 - 2 - - -  Trouble concentrating - - 3 - - -  Moving slowly or fidgety/restless - - 3 - - -  Suicidal thoughts 0 - 0 - - -  PHQ-9 Score - - 22 - - -  Difficult doing work/chores Extremely dIfficult - - - - -  Some recent data might be hidden     Review of Systems  Musculoskeletal:  Positive for back pain and gait problem.  Neurological:  Positive for weakness.  All  other systems reviewed and are negative.     Objective:   Physical Exam Awake, crying and moaning pain- in power w/c; not just crying, but  almost hysterical with crying- in and out; accompanied by wife; spitting into a bowl -hard to swallow spit? Cried self out; exhausted  Noted that saw something "crawling" on floor to left of w/c- nothing there.  Skin crepey on arms- and has lost more weight- looks ~ 140-145 lbs at most- and used to weigh 158- last time and 180 prior to that.        Assessment & Plan:   Pt is a 55 yr old male with hx of Multitrauma- causing L femoral neck fx, degloving of L hip to going/scrotum, bladder neck trauma- got SPC, developed compartment syndrome -s/p surgery for that; also diverting colostomy, skin grafts, and s/p trach and PEG- PEG is out. 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 S/P screw removal and on IV ABX for L hip osteomyelitis. 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  B/L tennis elbow new-and B/L pending/forming ulnar neuropathy Osteomyelitis of L hip found again-on  PO vanc and Cipro    Here for f/u on chronic pain.  Due to stomach pain after taking in a few bites, will try Pepcid 40 mg BID- since cannot take a PPI due to PO ABX he's on.  Cannot do Actiq since need a specific training course to take.  Don't give Dilaudid and Oxycodone together -the 2 short acting pain meds.  Dilaudid 4-6 mg qFOUR hours as needed for pain- try the 4 mg first and can use up to 6 mg every 4 hours as needed- replace the Oxycodone 10-20 mg with this dose. And will see if needs to try this dose or 8 mg? Needs to take pain meds AFTER eating something- don't take on empty stomach- will cause abdominal pain if takes on empty stomach.  Reduced Gabapentin to 300 mg TID since caused increased confusion at higher doses. Discussed NEEDS J  tube or PEG- actually suggest if possible see if can get tube to feed to upper part of intestine;  Will help him get more nutrition and also bypass the abd pain when he eats- and helps abd pain.  Pt scared/embarrassed about this- Suggest getting a mickey connection so won't be noticeable.  Will add Abilify 2 mg QHS/nightly- for adjuvant for mood-  When needs Marinol, let me know- really needs it for severe malnutrition.  Just refilled Duragesic last week, but not due yet when refilled- still hasn't gotten refilled- so has 1 month supply- will refill 7/27 and will last 1 month.  Needs a rail extension to help keep him in bed.  Take pictures of wounds so I can see how they are going.  Have texted Dr Phillips OdorGolding  to ask about pain regimen- haven't heard back. Ok'd per pt.  F/U in 6-8 weeks- double appointment-   I spent a total of 52 minutes on visit- as detailed above.

## 2021-01-07 ENCOUNTER — Ambulatory Visit (INDEPENDENT_AMBULATORY_CARE_PROVIDER_SITE_OTHER): Payer: PRIVATE HEALTH INSURANCE | Admitting: Gastroenterology

## 2021-01-07 VITALS — BP 98/58 | HR 82

## 2021-01-07 DIAGNOSIS — R1314 Dysphagia, pharyngoesophageal phase: Secondary | ICD-10-CM | POA: Diagnosis not present

## 2021-01-07 DIAGNOSIS — R634 Abnormal weight loss: Secondary | ICD-10-CM

## 2021-01-07 DIAGNOSIS — R1310 Dysphagia, unspecified: Secondary | ICD-10-CM | POA: Diagnosis not present

## 2021-01-07 DIAGNOSIS — R1084 Generalized abdominal pain: Secondary | ICD-10-CM | POA: Diagnosis not present

## 2021-01-07 NOTE — Patient Instructions (Addendum)
If you are age 54 or older, your body mass index should be between 23-30. Your There is no height or weight on file to calculate BMI. If this is out of the aforementioned range listed, please consider follow up with your Primary Care Provider.  If you are age 64 or younger, your body mass index should be between 19-25. Your There is no height or weight on file to calculate BMI. If this is out of the aformentioned range listed, please consider follow up with your Primary Care Provider.   __________________________________________________________  The [facility] providers would like to encourage you to use MYCHART to communicate with providers for non-urgent requests or questions.  Due to long hold times on the telephone, sending your provider a message by MYCHART may be a faster and more efficient way to get a response.  Please allow 48 business hours for a response.  Please remember that this is for non-urgent requests.    It was a pleasure to see you today!  Thank you for trusting me with your gastrointestinal care!     

## 2021-01-07 NOTE — Progress Notes (Signed)
[facility] Progress Note  Chief Complaint: Dysphagia and weight loss  Subjective  History: Christopher [name] was sent for reevaluation related to his chronic abdominal pain, dysphagia and weight loss.  Extensive clinic note from 08/20/2020 outlines his history.  [name] was here with his wife and the [employer] nurse that had accompanied them to the last visit with me. He has ongoing issues with chronic pain issues and persistent decubitus ulcer and osteomyelitis.  He is on escalating doses of opioids and has chronic generalized abdominal pain as well as anxiety.  He was hospitalized in June for decubitus ulcer and osteomyelitis.  With all this progressive illness his appetite has steadily declined.  Either his stomach hurts with eating or he just does not feel well enough to eat.  His oral intake is declined and he is continuing to lose weight.  He has chronic fatigue as a result, and is caregivers are concerned that he may be unable to heal decubitus ulcer and recover from infection due to malnutrition.  ROS: Cardiovascular:  no chest pain Respiratory: no dyspnea Unable to obtain additional review of systems.  Patient is somnolent during much of the visit The patient's Past Medical, Family and Social History were reviewed and are on file in the EMR.  Objective:  Med list reviewed  Current Outpatient Medications:    albuterol (VENTOLIN HFA) 108 (90 Base) MCG/ACT inhaler, INHALE 2 PUFFS BY MOUTH EVERY 6 HOURS AS NEEDED FOR WHEEZE OR SHORTNESS OF BREATH (WAITING ON PA), Disp: 18 each, Rfl: 1   Amino Acids-Protein Hydrolys (FEEDING SUPPLEMENT, PRO-STAT 64,) LIQD, Take 30 mLs by mouth 3 (three) times daily with meals., Disp: 887 mL, Rfl: 0   Blood Pressure Monitoring (BLOOD PRESSURE CUFF) MISC, Use daily as needed to check blood pressure., Disp: 1 each, Rfl: 0   busPIRone (BUSPAR) 5 MG tablet, Take 2 tablets (10 mg total) by mouth 3 (three) times daily. for anxiety, Disp: , Rfl:     ciprofloxacin (CIPRO) 500 MG tablet, Take 1 tablet (500 mg total) by mouth 2 (two) times daily., Disp: 60 tablet, Rfl: 11   clonazePAM (KLONOPIN) 0.5 MG tablet, Take 0.5 tablets (0.25 mg total) by mouth every 6 (six) hours as needed for anxiety., Disp: 60 tablet, Rfl: 5   dextromethorphan-guaiFENesin (MUCINEX DM) 30-600 MG 12hr tablet, Take 2 tablets by mouth 2 (two) times daily., Disp: , Rfl:    dronabinol (MARINOL) 2.5 MG capsule, TAKE 1 CAPSULE (2.5 MG TOTAL) BY MOUTH 2 (TWO) TIMES DAILY BEFORE A MEAL., Disp: 60 capsule, Rfl: 5   famotidine (PEPCID) 40 MG tablet, Take 1 tablet (40 mg total) by mouth 2 (two) times daily., Disp: 60 tablet, Rfl: 5   fentaNYL (DURAGESIC) 75 MCG/HR, Place 1 patch onto the skin every other day., Disp: 15 patch, Rfl: 0   gabapentin (NEURONTIN) 300 MG capsule, Take 1 capsule (300 mg total) by mouth 3 (three) times daily., Disp: 90 capsule, Rfl: 3   HYDROmorphone (DILAUDID) 4 MG tablet, Take 1-1.5 tablets (4-6 mg total) by mouth every 4 (four) hours as needed for up to 7 days for severe pain., Disp: 65 tablet, Rfl: 0   lidocaine (LIDODERM) 5 %, PLACE 3 PATCHES ONTO THE SKIN EVERY 12 HOURS. REMOVE & DISCARD PATCH WITHIN 12 HOURS OR AS DIRECTED BY MD, Disp: 90 patch, Rfl: 5   melatonin ([employer] MELATONIN) 3 MG TABS tablet, Take 1 tablet (3 mg total) by mouth at bedtime., Disp: 10 tablet, Rfl: 0  methocarbamol (ROBAXIN) 500 MG tablet, TAKE 1 TABLET BY MOUTH EVERY 6 HOURS AS NEEDED FOR MUSCLE SPASMS, Disp: 100 tablet, Rfl: 11   mirabegron ER (MYRBETRIQ) 50 MG TB24 tablet, Take 50 mg by mouth daily., Disp: , Rfl:    Multiple Vitamin (MULTIVITAMIN WITH MINERALS) TABS tablet, Take 1 tablet by mouth daily., Disp:  , Rfl:    naloxegol oxalate (MOVANTIK) 12.5 MG TABS tablet, Take 1 tablet (12.5 mg total) by mouth daily., Disp: 30 tablet, Rfl: 11   naloxone (NARCAN) nasal spray 4 mg/0.1 mL, To use if pt develops unconsciousness or confusion that family thinks is related to opioids., Disp:  1 each, Rfl: 3   oxybutynin (DITROPAN) 5 MG tablet, Take 5 mg by mouth See admin instructions. Take 5 mg by mouth three times a day and an additional 5 mg once daily as needed for urinary urgency, Disp: , Rfl:    oxyCODONE (ROXICODONE) 5 MG immediate release tablet, Take 1 tablet (5 mg total) by mouth every 3 (three) hours as needed for severe pain., Disp: 180 tablet, Rfl: 0   PARoxetine (PAXIL) 40 MG tablet, Take 1 tablet (40 mg total) by mouth at bedtime., Disp: 90 tablet, Rfl: 2   Probiotic Product (PROBIOTIC COLON SUPPORT) CAPS, Take 1 capsule by mouth daily., Disp: , Rfl:    promethazine (PHENERGAN) 12.5 MG tablet, TAKE 1 TABLET (12.5 MG TOTAL) BY MOUTH EVERY 6 (SIX) HOURS AS NEEDED FOR NAUSEA, VOMITING OR REFRACTORY NAUSEA / VOMITING., Disp: 90 tablet, Rfl: 5   senna-docusate (SENOKOT-S) 8.6-50 MG tablet, Take 2 tablets by mouth 3 (three) times daily., Disp: , Rfl:    traZODone (DESYREL) 100 MG tablet, TAKE 1 TO 2 TABLETS AT BEDTIME, Disp: 180 tablet, Rfl: 1   vancomycin (VANCOCIN) 125 MG capsule, Take 1 capsule (125 mg total) by mouth in the morning and at bedtime., Disp: 90 capsule, Rfl: 1   Vitamin D, Ergocalciferol, (DRISDOL) 1.25 MG (50000 UNIT) CAPS capsule, Take 1 capsule (50,000 Units total) by mouth every 7 (seven) days., Disp: 5 capsule, Rfl: 0   ARIPiprazole (ABILIFY) 2 MG tablet, Take 1 tablet (2 mg total) by mouth at bedtime. For mood (Patient not taking: Reported on 01/07/2021), Disp: 30 tablet, Rfl: 5   Vital signs in last 24 hrs: Vitals:   01/07/21 1521  BP: (!) 98/58  Pulse: 82  SpO2: 98%   Wt Readings from Last 3 Encounters:  11/22/20 158 lb 8.2 oz (71.9 kg)  11/04/20 160 lb (72.6 kg)  10/14/20 160 lb (72.6 kg)    Physical Exam  Debilitated man in wheelchair.  Somnolent much of the visit HEENT: sclera anicteric, oral mucosa moist without lesions Neck: supple, no thyromegaly, JVD or lymphadenopathy Cardiac: RRR without murmurs, S1S2 heard, no peripheral  edema Pulm: clear to auscultation bilaterally, normal RR and effort noted Abdomen: soft, mild generalized tenderness, with active bowel sounds. No guarding or palpable hepatosplenomegaly, somewhat limited by body position and Skin; warm and dry, no jaundice or rash Suprapubic catheter Left-sided colostomy. PEG site scar  Labs:  CBC Latest Ref Rng & Units 11/24/2020 11/22/2020 10/18/2020  WBC 4.0 - 10.5 K/uL 5.1 5.5 4.3  Hemoglobin 13.0 - 17.0 g/dL 8.1(O) 11.3(L) 9.3(L)  Hematocrit 39.0 - 52.0 % 31.4(L) 37.0(L) 30.6(L)  Platelets 150 - 400 K/uL 241 274 211   CMP Latest Ref Rng & Units 11/25/2020 11/24/2020 11/23/2020  Glucose 70 - 99 mg/dL 17(P) 10(C) 79  BUN 6 - 20 mg/dL 9 11 9   Creatinine  0.61 - 1.24 mg/dL 6.60 6.30 1.60  Sodium 135 - 145 mmol/L 138 135 137  Potassium 3.5 - 5.1 mmol/L 3.2(L) 3.4(L) 3.2(L)  Chloride 98 - 111 mmol/L 104 102 103  CO2 22 - 32 mmol/L 18(L) 21(L) 21(L)  Calcium 8.9 - 10.3 mg/dL 9.1 9.2 10.9  Total Protein 6.5 - 8.1 g/dL - 6.3(L) 7.9  Total Bilirubin 0.3 - 1.2 mg/dL - 1.1 1.1  Alkaline Phos 38 - 126 U/L - 125 156(H)  AST 15 - 41 U/L - 26 36  ALT 0 - 44 U/L - 31 39   Albumin 2.8 ___________________________________________ Radiologic studies:  CLINICAL DATA:  Decubitus ulcer.  Severe pain.   EXAM: CT ABDOMEN AND PELVIS WITH CONTRAST   TECHNIQUE: Multidetector CT imaging of the abdomen and pelvis was performed using the standard protocol following bolus administration of intravenous contrast.   CONTRAST:  41mL OMNIPAQUE IOHEXOL 300 MG/ML  SOLN   COMPARISON:  10/14/2020   FINDINGS: Lower chest: Persistent small left pleural effusion and bibasilar atelectasis. The heart is normal in size. No pericardial effusion.   Hepatobiliary: No hepatic lesions are identified. The gallbladder is unremarkable. Mild common bile duct dilatation appears relatively stable. Maximum diameter of the common bile duct in the head of the pancreas is 8 mm but it tapers  normally toward the ampulla. Stable mild intrahepatic biliary dilatation also.   Pancreas: Moderate pancreatic atrophy but no mass or acute inflammation.   Spleen: Normal size.  No focal lesions.   Adrenals/Urinary Tract: Stable left adrenal gland nodule consistent with a benign adenoma. The left adrenal gland is normal.   Both kidneys are unremarkable. No renal lesions, hydronephrosis or findings to suggest pyelonephritis.   No renal calculi but there is a 2 mm distal right ureteral calculus but no hydroureter. There are stable bladder calculi.   Patient has a suprapubic catheter in place. Mild uniform bladder wall thickening. No bladder mass.   Stomach/Bowel: The stomach, duodenum, small bowel and colon are grossly normal. No acute inflammatory changes, mass lesions or obstructive findings. The terminal ileum is normal. The appendix is normal. 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   Vascular/Lymphatic: Stable vascular calcifications but no aneurysm or dissection. The major venous structures are patent.   Reproductive: The prostate gland and seminal vesicles are unremarkable.   Other: No free pelvic fluid collections or pelvic abscess.   Musculoskeletal: Stable remote posttraumatic and postsurgical changes involving the bony pelvis.   There is a deep decubitus type wound involving the upper left iliac bone with some type of wound packing noted. This extends right down into the widened and irregular left SI joint. 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 Do not see any significant change when compared to the recent prior CT scan. Evidence of remote removed hardware crossing the right SI joint which is mildly widened.   Left hip hardware is noted but I do not see any findings suspicious for septic arthritis or osteomyelitis involving the hips.   IMPRESSION: 1. Stable  remote posttraumatic and postsurgical changes involving the bony pelvis. 2. Deep soft tissue wound involving the upper left buttock area with some type of wound packing noted. This extends right down into the widened and irregular left SI joint. 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 3. No findings suspicious for septic arthritis or osteomyelitis involving the hips. 4. Suprapubic catheter  in place with mild uniform bladder wall thickening. 5. Stable bladder calculi. 6. 2 mm nonobstructing distal right ureteral calculus. 7. Stable small left pleural effusion and bibasilar atelectasis. 8. Stable left adrenal gland adenoma. 9. 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     Electronically Signed   By: Rudie Meyer M.D.   On: 11/22/2020 15:16  ____________________________________________ Other:   _____________________________________________ Assessment & Plan  Assessment: Encounter Diagnoses  Name Primary?   Dysphagia, unspecified type Yes   Pharyngoesophageal dysphagia    Weight loss    Generalized abdominal pain    Is generalized abdominal pain appears due to his chronic pain syndrome and escalating doses of opioids.  No evidence of obstruction, intra-abdominal infection or fluid collection on imaging.  He has had a steady decline related to his chronic infection with further deconditioning and malnutrition.  This is all affecting his appetite, given generalized muscle weakness and worsening pharyngeal esophageal dysphagia and risk of aspiration.  There is no focal stricture seen on barium study earlier this year.  He and his wife say they are ready for him to have a feeding tube to hopefully turn his condition around if possible.  Plan: Referral to interventional radiology for PEG placement.  31 minutes were spent on this encounter (including chart review, history/exam,  counseling/coordination of care, and documentation) > 50% of that time was spent on counseling and coordination of care.  Chart review of recent hospitalization required topics discussed included: See above.  Charlie Pitter III

## 2021-01-12 ENCOUNTER — Telehealth: Payer: Self-pay | Admitting: Gastroenterology

## 2021-01-12 ENCOUNTER — Other Ambulatory Visit: Payer: Self-pay

## 2021-01-12 DIAGNOSIS — K224 Dyskinesia of esophagus: Secondary | ICD-10-CM

## 2021-01-12 DIAGNOSIS — R634 Abnormal weight loss: Secondary | ICD-10-CM

## 2021-01-12 DIAGNOSIS — R1310 Dysphagia, unspecified: Secondary | ICD-10-CM

## 2021-01-12 NOTE — Telephone Encounter (Signed)
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 She is requesting call to confirm the referral sent to interventional radiology. She is also requesting the office notes. (P) [phone] (F) [phone]

## 2021-01-12 NOTE — Telephone Encounter (Signed)
Left a detailed message for Christopher [name] and the patient.  Appointment for IR radiology has been scheduled for 01-21-2021 arrive 8 am at [facility] .Pt is to be NPO midnight.

## 2021-01-15 ENCOUNTER — Telehealth: Payer: Self-pay

## 2021-01-15 MED ORDER — HYDROMORPHONE HCL 4 MG PO TABS: ORAL_TABLET | ORAL | 0 refills | Status: DC

## 2021-01-15 NOTE — Telephone Encounter (Signed)
PMP was Reviewed.  Dr Berline Chough note was reviewed.  Hydromorphone- e-scribed.  Placed a call to [facility] regarding the above, she verbalizes understanding.

## 2021-01-15 NOTE — Telephone Encounter (Signed)
Melanie wife of patient called stating patient needs a refill on Hydromorphone. Last filled #65(7 day supply) on 01/01/21. Can you address in Dr. Dahlia Client absence?

## 2021-01-20 ENCOUNTER — Other Ambulatory Visit: Payer: Self-pay | Admitting: Internal Medicine

## 2021-01-20 MED ORDER — DEXTROSE 5 % IV SOLN: 2.0000 g | Freq: Once | INTRAVENOUS | Status: DC

## 2021-01-21 ENCOUNTER — Telehealth: Payer: Self-pay | Admitting: *Deleted

## 2021-01-21 ENCOUNTER — Encounter (HOSPITAL_COMMUNITY): Payer: Self-pay

## 2021-01-21 ENCOUNTER — Other Ambulatory Visit: Payer: Self-pay

## 2021-01-21 ENCOUNTER — Ambulatory Visit (HOSPITAL_COMMUNITY)
Admission: RE | Admit: 2021-01-21 | Discharge: 2021-01-21 | Disposition: A | Discharge status: Home / Self Care | Payer: No Typology Code available for payment source | Source: Ambulatory Visit | Attending: Gastroenterology | Admitting: Gastroenterology

## 2021-01-21 DIAGNOSIS — Z79899 Other long term (current) drug therapy: Secondary | ICD-10-CM | POA: Insufficient documentation

## 2021-01-21 DIAGNOSIS — Z885 Allergy status to narcotic agent status: Secondary | ICD-10-CM | POA: Diagnosis not present

## 2021-01-21 DIAGNOSIS — K224 Dyskinesia of esophagus: Secondary | ICD-10-CM

## 2021-01-21 DIAGNOSIS — E46 Unspecified protein-calorie malnutrition: Secondary | ICD-10-CM | POA: Diagnosis present

## 2021-01-21 DIAGNOSIS — R1310 Dysphagia, unspecified: Secondary | ICD-10-CM

## 2021-01-21 DIAGNOSIS — Z681 Body mass index (BMI) 19 or less, adult: Secondary | ICD-10-CM | POA: Insufficient documentation

## 2021-01-21 DIAGNOSIS — R634 Abnormal weight loss: Secondary | ICD-10-CM

## 2021-01-21 HISTORY — PX: IR GASTROSTOMY TUBE MOD SED: IMG625

## 2021-01-21 LAB — BASIC METABOLIC PANEL
Anion gap: 9 (ref 5–15)
BUN: 8 mg/dL (ref 6–20)
CO2: 30 mmol/L (ref 22–32)
Calcium: 10.1 mg/dL (ref 8.9–10.3)
Chloride: 102 mmol/L (ref 98–111)
Creatinine, Ser: 0.56 mg/dL — ABNORMAL LOW (ref 0.61–1.24)
GFR, Estimated: >60 mL/min (ref >60)
Glucose, Bld: 114 mg/dL — ABNORMAL HIGH (ref 70–99)
Potassium: 3.6 mmol/L (ref 3.5–5.1)
Sodium: 141 mmol/L (ref 135–145)

## 2021-01-21 LAB — CBC
HCT: 41.6 % (ref 39.0–52.0)
Hemoglobin: 12.7 g/dL — ABNORMAL LOW (ref 13.0–17.0)
MCH: 30.2 pg (ref 26.0–34.0)
MCHC: 30.5 g/dL (ref 30.0–36.0)
MCV: 99 fL (ref 80.0–100.0)
Platelets: 254 10*3/uL (ref 150–400)
RBC: 4.2 MIL/uL — ABNORMAL LOW (ref 4.22–5.81)
RDW: 12.9 % (ref 11.5–15.5)
WBC: 6.5 10*3/uL (ref 4.0–10.5)
nRBC: 0 % (ref 0.0–0.2)

## 2021-01-21 LAB — PROTIME-INR
INR: 0.9 (ref 0.8–1.2)
Prothrombin Time: 11.9 seconds (ref 11.4–15.2)

## 2021-01-21 MED ORDER — LIDOCAINE-EPINEPHRINE 1 %-1:100000 IJ SOLN
INTRAMUSCULAR | Status: AC | PRN
  Administered 2021-01-21: 20 mL via INTRADERMAL

## 2021-01-21 MED ORDER — IOHEXOL 300 MG/ML  SOLN
25.0000 mL | Freq: Once | INTRAMUSCULAR | Status: AC | PRN
  Administered 2021-01-21: 20 mL

## 2021-01-21 MED ORDER — MIDAZOLAM HCL 2 MG/2ML IJ SOLN
INTRAMUSCULAR | Status: AC | PRN
  Administered 2021-01-21: 1 mg via INTRAVENOUS
  Administered 2021-01-21: 0.5 mg via INTRAVENOUS
  Administered 2021-01-21: 1 mg via INTRAVENOUS
  Administered 2021-01-21: 0.5 mg via INTRAVENOUS
  Administered 2021-01-21: 1 mg via INTRAVENOUS

## 2021-01-21 MED ORDER — SODIUM CHLORIDE 0.9 % IV SOLN: INTRAVENOUS | Status: DC

## 2021-01-21 MED ORDER — MIDAZOLAM HCL 2 MG/2ML IJ SOLN
INTRAMUSCULAR | Status: AC
  Filled 2021-01-21: qty 4

## 2021-01-21 MED ORDER — CEFAZOLIN SODIUM-DEXTROSE 2-4 GM/100ML-% IV SOLN
INTRAVENOUS | Status: AC
  Filled 2021-01-21: qty 100

## 2021-01-21 MED ORDER — LIDOCAINE-EPINEPHRINE 1 %-1:100000 IJ SOLN
INTRAMUSCULAR | Status: AC
  Filled 2021-01-21: qty 1

## 2021-01-21 MED ORDER — FENTANYL CITRATE (PF) 100 MCG/2ML IJ SOLN
INTRAMUSCULAR | Status: AC
  Filled 2021-01-21: qty 2

## 2021-01-21 MED ORDER — LIDOCAINE VISCOUS HCL 2 % MT SOLN
OROMUCOSAL | Status: AC | PRN
Start: 2021-01-21 — End: 2021-01-21
  Administered 2021-01-21: 15 mL via ORAL

## 2021-01-21 MED ORDER — FENTANYL CITRATE (PF) 100 MCG/2ML IJ SOLN
INTRAMUSCULAR | Status: AC | PRN
  Administered 2021-01-21 (×4): 25 ug via INTRAVENOUS

## 2021-01-21 MED ORDER — LIDOCAINE VISCOUS HCL 2 % MT SOLN
OROMUCOSAL | Status: AC
  Filled 2021-01-21: qty 15

## 2021-01-21 MED ORDER — GLUCAGON HCL RDNA (DIAGNOSTIC) 1 MG IJ SOLR
INTRAMUSCULAR | Status: AC
  Filled 2021-01-21: qty 1

## 2021-01-21 MED ORDER — CEFAZOLIN SODIUM-DEXTROSE 2-4 GM/100ML-% IV SOLN: 2.0000 g | INTRAVENOUS | Status: DC

## 2021-01-21 NOTE — Sedation Documentation (Signed)
Patient is resting comfortably. 

## 2021-01-21 NOTE — Discharge Instructions (Addendum)
Please call Interventional Radiology clinic [phone] with any questions or concerns.  You may remove your dressing and shower tomorrow.  Please follow up with current Home Health agency for G-tube care and supplies.  You may begin eating and drinking after 3:00 pm  You may begin oral medication right away.

## 2021-01-21 NOTE — Telephone Encounter (Signed)
Laretta Bolster NCM called to report that Christopher [name] had his G tube placed and will be good to use tomorrow.  Dr Myrtie Neither defers to Dr Berline Chough about ordering his tube feedings and supplies.  Christopher [name] is asking Dr Berline Chough to fax to her the orders @ [phone].

## 2021-01-21 NOTE — Procedures (Signed)
Interventional Radiology Procedure Note  Date of Procedure: 01/21/2021  Procedure: Image guided G tube insertion   Findings:  1. Successful placement of a 20 Fr balloon G tube using fluoro    Complications: No immediate complications noted.   Estimated Blood Loss: minimal  Follow-up and Recommendations: 1. Observe 2 hours post procedure  2. G tube may be used after 4 hours    Olive Bass, MD  Vascular & Interventional Radiology  01/21/2021 10:55 AM

## 2021-01-21 NOTE — Sedation Documentation (Signed)
The patient is on chronic abx therapy. Dr. Payton Mccallum Ab will not use intravenous ancef during this case.

## 2021-01-21 NOTE — H&P (Signed)
Interventional Radiology - Inpatient Consult H&P    Referring Provider (current admission): Danis, Andreas Blower, MD  Reason for Consult: Malnutrition, G-tube placement     History of Present Illness  ANCEL EASLER is a 55 y.o. male with a relevant past medical history of trauma seen in consultation for G tube placement. Had previous G tube for about 6 months, removed in June 2020 or so per family in room. 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 He has been having poor PO intake with 50 lb weight loss.   He has been in his usual state of health over the past 2 weeks. Denies recent acute illness.     Additional Past Medical History Past Medical History:  Diagnosis Date   Acute on chronic respiratory failure with hypoxia (HCC) 06/2018   trach removed 11-16-2018, on vent from jan until may 2020 - uses albuterol prn   Anxiety    Bacteremia due to Pseudomonas 06/2018   Chronic osteomyelitis (HCC)    Chronic pain syndrome    Clostridium difficile colitis 10/30/2019   tx with abx    Depression    DVT (deep venous thrombosis) (HCC) 2020   right brachial post PICC line   History of blood transfusion 06/2018   History of Clostridioides difficile colitis    History of kidney stones    Hypertension    norvasc d/c by pcp on 11/05/19   Multiple traumatic injuries    Penile pain 11/18/2019   Pneumonia 11/2009   2020 x 2   Walker as ambulation aid    Wheelchair bound    electric   Wound discharge    left hip wound with bloody/clear drainage change dressing q day surgilube with gauze, between legs wound using calcium algenate pad bid    Surgical History  Past Surgical History:  Procedure Laterality Date   APPLICATION OF A-CELL OF BACK N/A 08/06/2018   Procedure: Application Of A-Cell Of Back;  Surgeon: Peggye Form, DO;  [facility]: [facility];  Service: Plastics;  Laterality: N/A;    APPLICATION OF A-CELL OF EXTREMITY Left 08/06/2018   Procedure: Application Of A-Cell Of Extremity;  Surgeon: Peggye Form, DO;  [facility]: [facility];  Service: Plastics;  Laterality: Left;   APPLICATION OF A-CELL OF EXTREMITY Left 09/18/2019   Procedure: APPLICATION OF A-CELL OF EXTREMITY;  Surgeon: Peggye Form, DO;  [facility]: [facility];  Service: Plastics;  Laterality: Left;   APPLICATION OF WOUND VAC  07/12/2018   Procedure: Application Of Wound Vac to the Left Thigh and Scrotum.;  Surgeon: Roby Lofts, MD;  Location: [facility];  Service: Orthopedics;;   APPLICATION OF WOUND VAC  07/10/2018   Procedure: Application Of Wound Vac;  Surgeon: Berna Bue, MD;  Location: [facility] OR;  Service: General;;   COLON SURGERY  2020   colostomy   COLOSTOMY N/A 07/23/2018   Procedure: COLOSTOMY;  Surgeon: Violeta Gelinas, MD;  Location: [facility] OR;  Service: General;  Laterality: N/A;   CYSTOSCOPY W/ URETERAL STENT PLACEMENT N/A 07/15/2018   Procedure: RETROGRADE URETHROGRAM;  Surgeon: Marcine Matar, MD;  Location: [facility] OR;  Service: Urology;  Laterality: N/A;   CYSTOSCOPY WITH LITHOLAPAXY N/A 05/06/2019   Procedure: CYSTOSCOPY BASKET BLADDER STONE EXTRACTION;  Surgeon: Malen Gauze, MD;  Location: [facility];  Service: Urology;  Laterality: N/A;  30 MINS   CYSTOSTOMY N/A 05/06/2019   Procedure:  REPLACEMENT OF SUPRAPUBIC CATHETER;  Surgeon: Malen Gauze, MD;  Location: [facility];  Service: Urology;  Laterality: N/A;   DEBRIDEMENT AND CLOSURE WOUND Left 03/04/2019   Procedure: Excision of hip wound with placement of Acell;  Surgeon: Peggye Form, DO;  [facility];  Service: Plastics;  Laterality: Left;   ESOPHAGOGASTRODUODENOSCOPY N/A 08/14/2018   Procedure: ESOPHAGOGASTRODUODENOSCOPY (EGD);  Surgeon: Violeta Gelinas, MD;  Location: [facility] ENDOSCOPY;  Service: General;  Laterality: N/A;  bedside   FACIAL RECONSTRUCTION SURGERY     X 2--once as a teenager  and second time in his 12's   HARDWARE REMOVAL Left 03/04/2019   Procedure: Left Hip Hardware Removal;  Surgeon: Roby Lofts, MD;  [facility];  Service: Orthopedics;  Laterality: Left;   HIP PINNING,CANNULATED Left 07/12/2018   Procedure: CANNULATED HIP PINNING;  Surgeon: Roby Lofts, MD;  [facility];  Service: Orthopedics;  Laterality: Left;   HIP SURGERY     HOLMIUM LASER APPLICATION Right 07/15/2019   Procedure: HOLMIUM LASER APPLICATION;  Surgeon: Malen Gauze, MD;  Location: [facility];  Service: Urology;  Laterality: Right;   I & D EXTREMITY Left 07/25/2018   Procedure: Debridement of buttock, scrotum and left leg, placement of acell and vac;  Surgeon: Peggye Form, DO;  [facility];  Service: Plastics;  Laterality: Left;   I & D EXTREMITY N/A 08/06/2018   Procedure: Debridement of buttock, scrotum and left leg;  Surgeon: Peggye Form, DO;  [facility];  Service: Plastics;  Laterality: N/A;   I & D EXTREMITY N/A 08/13/2018   Procedure: Debridement of buttock, scrotum and left leg, placement of acell and vac;  Surgeon: Peggye Form, DO;  [facility];  Service: Plastics;  Laterality: N/A;  90 min, please   INCISION AND DRAINAGE HIP Left 09/18/2019   Procedure: IRRIGATION AND DEBRIDEMENT HIP/ PELVIS WITH WOUND VAC PLACEMENT;  Surgeon: Roby Lofts, MD;  [facility];  Service: Orthopedics;  Laterality: Left;   INCISION AND DRAINAGE OF WOUND N/A 07/18/2018   Procedure: Debridement of left leg, buttocks and scrotal wound with placement of acell and Flexiseal;  Surgeon: Peggye Form, DO;  [facility];  Service: Plastics;  Laterality: N/A;   INCISION AND DRAINAGE OF WOUND Left 08/29/2018   Procedure: Debridement of buttock, scrotum and left leg, placement of acell and vac;  Surgeon: Peggye Form, DO;  [facility];  Service: Plastics;  Laterality: Left;  75 min, please   INCISION AND DRAINAGE OF WOUND  Bilateral 10/23/2018   Procedure: DEBRIDEMENT OF BUTTOCK,SCROTUM, AND LEG WOUNDS WITH PLACEMENT OF ACELL- BILATERAL 90 MIN;  Surgeon: Peggye Form, DO;  [facility];  Service: Plastics;  Laterality: Bilateral;   IR ANGIOGRAM PELVIS SELECTIVE OR SUPRASELECTIVE  07/10/2018   IR ANGIOGRAM PELVIS SELECTIVE OR SUPRASELECTIVE  07/10/2018   IR ANGIOGRAM SELECTIVE EACH ADDITIONAL VESSEL  07/10/2018   IR EMBO ART  VEN HEMORR LYMPH EXTRAV  INC GUIDE ROADMAPPING  07/10/2018   IR NEPHROSTOMY PLACEMENT LEFT  04/05/2019   IR NEPHROSTOMY PLACEMENT RIGHT  05/31/2019   IR US GUIDE BX ASP/DRAIN  07/10/2018   IR US GUIDE VASC ACCESS RIGHT  07/10/2018   IR VENO/EXT/UNI LEFT  07/10/2018   IRRIGATION AND DEBRIDEMENT OF WOUND WITH SPLIT THICKNESS SKIN GRAFT Left 09/19/2018   Procedure: Debridement of gluteal wound with placement of acell to left leg.;  Surgeon: Peggye Form,  DO;  [facility];  Service: Plastics;  Laterality: Left;  2.5 hours, please   LAPAROTOMY N/A 07/12/2018   Procedure: EXPLORATORY LAPAROTOMY;  Surgeon: Violeta Gelinas, MD;  Location: [facility] OR;  Service: General;  Laterality: N/A;   LAPAROTOMY N/A 07/15/2018   Procedure: WOUND EXPLORATION; CLOSURE OF ABDOMEN;  Surgeon: Violeta Gelinas, MD;  Location: [facility] OR;  Service: General;  Laterality: N/A;   LAPAROTOMY  07/10/2018   Procedure: Exploratory Laparotomy;  Surgeon: Berna Bue, MD;  Location: [facility] OR;  Service: General;;   MASS EXCISION Left 09/18/2019   Procedure: EXCISION UPPER LEFT INNER THIGH WOUND;  Surgeon: Peggye Form, DO;  [facility];  Service: Plastics;  Laterality: Left;   NEPHROLITHOTOMY Right 07/15/2019   Procedure: NEPHROLITHOTOMY PERCUTANEOUS;  Surgeon: Malen Gauze, MD;  Location: [facility];  Service: Urology;  Laterality: Right;  90 MINS   PEG PLACEMENT N/A 08/14/2018   Procedure: PERCUTANEOUS ENDOSCOPIC GASTROSTOMY (PEG) PLACEMENT;  Surgeon: Violeta Gelinas, MD;  Location: [facility] ENDOSCOPY;   Service: General;  Laterality: N/A;   PERCUTANEOUS TRACHEOSTOMY N/A 08/02/2018   Procedure: PERCUTANEOUS TRACHEOSTOMY;  Surgeon: Violeta Gelinas, MD;  Location: [facility] OR;  Service: General;  Laterality: N/A;   RADIOLOGY WITH ANESTHESIA N/A 07/10/2018   Procedure: IR WITH ANESTHESIA;  Surgeon: Simonne Come, MD;  Location: [facility] OR;  Service: Radiology;  Laterality: N/A;   RADIOLOGY WITH ANESTHESIA Right 07/10/2018   Procedure: Ir With Anesthesia;  Surgeon: Simonne Come, MD;  Location: [facility] OR;  Service: Radiology;  Laterality: Right;   SCROTAL EXPLORATION N/A 07/15/2018   Procedure: SCROTUM DEBRIDEMENT;  Surgeon: Marcine Matar, MD;  Location: [facility] OR;  Service: Urology;  Laterality: N/A;   SHOULDER SURGERY     SKIN SPLIT GRAFT Right 09/19/2018   Procedure: Skin Graft Split Thickness;  Surgeon: Peggye Form, DO;  [facility];  Service: Plastics;  Laterality: Right;   SKIN SPLIT GRAFT N/A 10/03/2018   Procedure: Split thickness skin graft to gluteal area with acell placement;  Surgeon: Peggye Form, DO;  Location[facility];  Service: Plastics;  Laterality: N/A;  3 hours, please   VACUUM ASSISTED CLOSURE CHANGE N/A 07/12/2018   Procedure: ABDOMINAL VACUUM ASSISTED CLOSURE CHANGE and abdominal washout;  Surgeon: Violeta Gelinas, MD;  Location: [facility] OR;  Service: General;  Laterality: N/A;   WOUND DEBRIDEMENT Left 07/23/2018   Procedure: DEBRIDEMENT LEFT BUTTOCK  WOUND;  Surgeon: Violeta Gelinas, MD;  Location: [facility] OR;  Service: General;  Laterality: Left;   WOUND EXPLORATION Left 07/10/2018   Procedure: WOUND EXPLORATION LEFT GROIN;  Surgeon: Berna Bue, MD;  Location: [facility] OR;  Service: General;  Laterality: Left;     Medications  I have reviewed the current medication list. Refer to chart for details. Current Outpatient Medications  Medication Instructions   albuterol (VENTOLIN HFA) 108 (90 Base) MCG/ACT inhaler INHALE 2 PUFFS BY MOUTH EVERY 6 HOURS AS NEEDED FOR WHEEZE OR SHORTNESS OF  BREATH (WAITING ON PA)   Amino Acids-Protein Hydrolys (FEEDING SUPPLEMENT, PRO-STAT 64,) LIQD 30 mLs, Oral, 3 times daily with meals   ARIPiprazole (ABILIFY) 2 mg, Oral, Daily at bedtime, For mood   Blood Pressure Monitoring (BLOOD PRESSURE CUFF) MISC Use daily as needed to check blood pressure.   busPIRone (BUSPAR) 10 mg, Oral, 3 times daily, for anxiety   ciprofloxacin (CIPRO) 500 mg, Oral, 2 times daily   clonazePAM (KLONOPIN) 0.25 mg, Oral, Every 6 hours PRN   dextromethorphan-guaiFENesin (MUCINEX DM) 30-600 MG  12hr tablet 2 tablets, Oral, 2 times daily   dronabinol (MARINOL) 2.5 mg, Oral, 2 times daily before meals   famotidine (PEPCID) 40 mg, Oral, 2 times daily   fentaNYL (DURAGESIC) 75 MCG/HR 1 patch, Transdermal, Every 48 hours   gabapentin (NEURONTIN) 300 mg, Oral, 3 times daily   HYDROmorphone (DILAUDID) 4 MG tablet Take 1-1.5 tablets (4-6 mg total) by mouth every 4 (four) hours as needed for up to 7 days for severe pain.   lidocaine (LIDODERM) 5 % PLACE 3 PATCHES ONTO THE SKIN EVERY 12 HOURS. REMOVE & DISCARD PATCH WITHIN 12 HOURS OR AS DIRECTED BY MD   melatonin ([employer] MELATONIN) 3 mg, Oral, Daily at bedtime   methocarbamol (ROBAXIN) 500 MG tablet TAKE 1 TABLET BY MOUTH EVERY 6 HOURS AS NEEDED FOR MUSCLE SPASMS   mirabegron ER (MYRBETRIQ) 50 mg, Oral, Daily   Multiple Vitamin (MULTIVITAMIN WITH MINERALS) TABS tablet 1 tablet, Oral, Daily   naloxegol oxalate (MOVANTIK) 12.5 mg, Oral, Daily   naloxone (NARCAN) nasal spray 4 mg/0.1 mL To use if pt develops unconsciousness or confusion that family thinks is related to opioids.   oxybutynin (DITROPAN) 5 mg, Oral, See admin instructions, Take 5 mg by mouth three times a day and an additional 5 mg once daily as needed for urinary urgency   PARoxetine (PAXIL) 40 mg, Oral, Daily at bedtime   Probiotic Product (PROBIOTIC COLON SUPPORT) CAPS 1 capsule, Oral, Daily   promethazine (PHENERGAN) 12.5 mg, Oral, Every 6 hours PRN   senna-docusate  (SENOKOT-S) 8.6-50 MG tablet 2 tablets, Oral, 3 times daily   traZODone (DESYREL) 100 MG tablet TAKE 1 TO 2 TABLETS AT BEDTIME   vancomycin (VANCOCIN) 125 mg, Oral, 2 times daily   Vitamin D (Ergocalciferol) (DRISDOL) 50,000 Units, Oral, Every 7 days     I have reviewed prior sedation medication usage: Yes    Allergies Allergies  Allergen Reactions   Methadone Other (See Comments)    Hallucinations/confusion   Does patient have contrast allergy: No     Physical Exam Current Vitals Temp: 98.4 F (36.9 C) (Temp Source: Axillary)  Pulse Rate: 98  Resp: (!) 22  BP: (!) 112/97  SpO2: 96 %  Height: 6\' 4"  (193 cm)  Weight: 70.3 kg  Body mass index is 18.87 kg/m.  General: Alert and answers questions appropriately. No apparent distress. HEENT: Normocephalic, atraumatic. Conjunctivae normal without scleral icterus. Cardiac: Regular rate and rhythm. No dependent edema. Pulmonary: Normal work of breathing. On room air. Abdominal: Soft without distension. Colostomy in place.    Pertinent Lab Results Labs: CBC: WBC/Hgb/Hct/Plts:  6.5/12.7/41.6/254 (08/11 0830)  BMP:   Coagulation:    CBC Trends: Recent Labs    01/21/21 0830  WBC 6.5  HGB 12.7*  HCT 41.6  PLT 254     Creatinine Trend: No results for input(s): CREATININE in the last 72 hours.   Relevant and/or Recent Imaging: CT A/P with good window for G tube; old G tube tract/scar visualized.    Assessment & Plan JAESEAN LITZAU is a 55 y.o. male seen in consultation for image-guided G tube placement.  He is an appropriate candidate for the procedure. The risks and benefit were discussed with him and family, which include bleeding, infection and injury to adjacent organs like the liver or colon. Given previous G tube tract, these risk may be slightly lower. He is agreeable to proceed.    Procedure Checklist:  Consent obtained: Risks of the procedure as well as the  alternatives and risks of each were  explained to the patient and/or caregiver.  Consent for the procedure was obtained and is signed in the bedside chart Consent obtained from: The patient Patient is appropriate candidate for sedation Yes; note high baseline narcotic threshold  ASA Classification: ASA 3 - Patient with moderate systemic disease with functional limitations NPO status: midnight   Code status:   Code Status: Prior Pre-procedural prep necessary: NG tube      Olive BassYasser El-Abd, MD  Vascular and Interventional Radiology 01/21/2021 9:00 AM

## 2021-01-22 ENCOUNTER — Telehealth: Payer: Self-pay | Admitting: Gastroenterology

## 2021-01-22 ENCOUNTER — Telehealth: Payer: Self-pay

## 2021-01-22 ENCOUNTER — Other Ambulatory Visit: Payer: Self-pay | Admitting: Physical Medicine and Rehabilitation

## 2021-01-22 MED ORDER — FENTANYL 75 MCG/HR TD PT72: 1.0000 | MEDICATED_PATCH | TRANSDERMAL | 0 refills | Status: DC

## 2021-01-22 MED ORDER — HYDROMORPHONE HCL 4 MG PO TABS: ORAL_TABLET | ORAL | 0 refills | Status: DC

## 2021-01-22 NOTE — Telephone Encounter (Signed)
Laretta Bolster " [facility] case manager )  [phone] Patient is needing G tubes for feeding  And needs orders placed for them and all the supplies for the feeding tubes and the frequency of how often he is needing to have the feedings   Please call cindy hale with any questions

## 2021-01-22 NOTE — Telephone Encounter (Signed)
Can you handle this please? 

## 2021-01-22 NOTE — Telephone Encounter (Signed)
Spoke with Arline Asp, advised that G tube feeding supplies and orders would come from patient's PCP. She states that Dr. [name] is out of the office today but she has contacted his office and will touch base with him on Monday. Arline Asp wanted to know if I knew of a dietician, advised that we usually send referrals to the [facility] and Diabetes services. Arline Asp thanked me for the call and had no concerns at the end.

## 2021-01-22 NOTE — Telephone Encounter (Signed)
Notified Cindy NCM.

## 2021-01-22 NOTE — Telephone Encounter (Signed)
I don't handle tube feeds- at all- I have never done so, so I truly don't feel comfortable doing it- ML

## 2021-01-22 NOTE — Telephone Encounter (Signed)
Inbound call from [facility] requesting a call from a nurse please.  Has questions about feeding tube orders.

## 2021-01-25 ENCOUNTER — Telehealth: Payer: Self-pay

## 2021-01-25 ENCOUNTER — Encounter: Payer: Self-pay | Admitting: Family Medicine

## 2021-01-25 DIAGNOSIS — K224 Dyskinesia of esophagus: Secondary | ICD-10-CM

## 2021-01-25 DIAGNOSIS — R1310 Dysphagia, unspecified: Secondary | ICD-10-CM

## 2021-01-25 DIAGNOSIS — Z931 Gastrostomy status: Secondary | ICD-10-CM

## 2021-01-25 DIAGNOSIS — R634 Abnormal weight loss: Secondary | ICD-10-CM

## 2021-01-25 NOTE — Telephone Encounter (Signed)
Patient's wife is calling in stating that Christopher [name] hasnt ate since Thursday and is wondering what they can do in order to get the orders placed. Wanting a call as soon as someone is available.

## 2021-01-25 NOTE — Telephone Encounter (Signed)
Spoke with Laretta Bolster RN  Dr [name] does not order feeding tubes, advise to talk to GI or surgeon for orders  Case Manager Arline Asp  will send patient to ED.

## 2021-01-25 NOTE — Telephone Encounter (Signed)
Lm on vm for Starla, RN at [facility] to return call.   Spoke with Renea Ee at [facility], she states that they do not have a dietician that they refer to or anyone that can place the orders for the patient.   I spoke with Baird Lyons in IR and she states that orders are usually placed prior to G-tube so that patient does not run out of supplies. We have discussed at length, patient may have to go to ED if he is not able to eat or drink. Baird Lyons states that we may have to refer patient back to PCP and send a separate referral to Nutrition and diabetes. Baird Lyons states that tube feeding orders are usually placed prior to tube placement. Will discuss further with nurse manager and DOD.

## 2021-01-25 NOTE — Telephone Encounter (Signed)
Noted, see 01/25/21 patient message.

## 2021-01-25 NOTE — Telephone Encounter (Signed)
After long discussion with Sheri and Dr. Adela Lank, we have decided that it is best to place temporary tube feeding orders for patient and will send referral to nutrition and diabetes for future recommendations for tube feeding orders. Per PCP's 12/22/20 office note he recommended a high calorie and high protein diet. See orders below. Ambulatory referral to nutrition in epic. I spoke with patient's wife at length in regards to recommendations. Apologized to the patient's wife for all of the confusion. Orders have been faxed as urgent/stat. Wife states that she has been giving patient's liquids by mouth but very minimal to make sure that he does not aspirate. Patient's wife verbalized understanding of all information and had no concerns at the end of the call.  We have placed stat orders to [facility], faxed to [phone] Jevity (1.5 cal) at 50 ml/hour. - Start at 21ml/hr, titrate by 10-20 ml/hr every 4 hours to goal - 1 additional protein packer (6gm/packet) per day - 250 ml free water flushes every 4 hours (62.78ml/hr)

## 2021-01-25 NOTE — Telephone Encounter (Signed)
Please call [name]. Barbera Setters. Seydel needs feeding tube orders. PCP & MD who placed the tube refuse to write orders.  Call back phone [phone]. Thank you.

## 2021-01-25 NOTE — Telephone Encounter (Signed)
Dr. Adela Lank, as DOD PM of 01/25/21 please advise. Thanks  Christopher [name] patient that was recently seen for dysphagia, weight loss, abdominal pain. Dr. Myrtie Neither placed an order to IR for G-tube placement, pt had this placed on 01/21/21. Patient's PCP, Dr. [name] and Dr. Berline Chough will not place the orders for the feedings or the supplies. Patient still sees [facility], will we be able to place orders for a dietary consult for home health? Please see additional note from patient's wife below.

## 2021-01-25 NOTE — Telephone Encounter (Signed)
Have spoken with pt's wife- literally, it seems very weird to me that GI won't write for orders on something they ordered- I explained I don't feel comfortable doing it, because I literally have never written before- and cannot determine "dosing" calories, and my concern for overfeeding syndrome.  She also reports he hasn't been taking his prn meds since tube was placed due to oversedation.   Let her know I'm out the rest of week- and can call my work cell if need be- and I can call GI/PCP- thank you

## 2021-01-25 NOTE — Telephone Encounter (Signed)
Inbound call from Sophronia Simas, RN at [facility]. States patient have been without food since 8/11 and need order as soon as can.

## 2021-01-26 ENCOUNTER — Encounter: Payer: Self-pay | Admitting: Family

## 2021-01-26 ENCOUNTER — Other Ambulatory Visit: Payer: Self-pay

## 2021-01-26 ENCOUNTER — Other Ambulatory Visit: Payer: Self-pay | Admitting: Physical Medicine and Rehabilitation

## 2021-01-26 ENCOUNTER — Telehealth (INDEPENDENT_AMBULATORY_CARE_PROVIDER_SITE_OTHER): Payer: Self-pay | Admitting: Family

## 2021-01-26 DIAGNOSIS — M8669 Other chronic osteomyelitis, multiple sites: Secondary | ICD-10-CM

## 2021-01-26 DIAGNOSIS — G8921 Chronic pain due to trauma: Secondary | ICD-10-CM

## 2021-01-26 DIAGNOSIS — E43 Unspecified severe protein-calorie malnutrition: Secondary | ICD-10-CM

## 2021-01-26 NOTE — Assessment & Plan Note (Signed)
Pain is currently managed by Physical Medicine and Rehabilitation. [name] is working to provide comfort with current regimen and trying to avoid possibility of oversedation.

## 2021-01-26 NOTE — Progress Notes (Signed)
Subjective:    Patient ID: Christopher [name], male    DOB: [birth year], 55 y.o.   MRN: [id number]  Chief Complaint  Patient presents with   Follow-up    Chronic osteomyelitis of hip - pt wife reports patient has been " out of it lately". Pt is supposed to get set up with G tube feedings today.      Virtual Visit via Telephone/Video Note   I connected with [name] Gucciardo on 01/26/2021 at 11:00 am by telephone and verified that I am speaking with/about the correct person using two identifiers.   I discussed the limitations, risks, security and privacy concerns of performing an evaluation and management service by telephone and the availability of in person appointments. I also discussed with the patient that there may be a patient responsible charge related to this service. The patient expressed understanding and agreed to proceed.  Location:  Patient: Home Provider: Clinic   HPI:  Christopher [name] is a 55 y.o. male with multiple pelvic fractures sustained from trauma complicated by hardware associated osteomyelitis status post hardware and excision of bone with cultures positive for multidrug-resistant Pseudomonas and maintained on ciprofloxacin and oral vancomycin since 05/18/2020 last seen on 10/27/2020 with good adherence and tolerance to ciprofloxacin and oral vancomycin.  Since that time I spoken with [facility] regarding concern for decline with potential for palliative care interventions.  Has been seen by gastroenterology for abdominal pain with G-tube now inserted and awaiting nutritional orders to start tube feeding given his decreased oral intake.  Christopher [name] continues to have issues with pain and disorientation.  Oral intake is decreased secondary to abdominal pain.  Tube feedings and pump are scheduled to arrive today.  Awaiting nutritional consult to optimize nutritional therapy given decreased oral intake.  Still awaiting potential palliative care consult with Dr. Phillips Odor  which has been approved for 1 ED visit.  Given decreased mental status He has concern for potential choking and complications.   Allergies  Allergen Reactions   Methadone Other (See Comments)    Hallucinations/confusion      Outpatient Medications Prior to Visit  Medication Sig Dispense Refill   albuterol (VENTOLIN HFA) 108 (90 Base) MCG/ACT inhaler INHALE 2 PUFFS BY MOUTH EVERY 6 HOURS AS NEEDED FOR WHEEZE OR SHORTNESS OF BREATH (WAITING ON PA) 18 each 1   Amino Acids-Protein Hydrolys (FEEDING SUPPLEMENT, PRO-STAT 64,) LIQD Take 30 mLs by mouth 3 (three) times daily with meals. 887 mL 0   ARIPiprazole (ABILIFY) 2 MG tablet Take 1 tablet (2 mg total) by mouth at bedtime. For mood 30 tablet 5   Blood Pressure Monitoring (BLOOD PRESSURE CUFF) MISC Use daily as needed to check blood pressure. 1 each 0   busPIRone (BUSPAR) 5 MG tablet Take 2 tablets (10 mg total) by mouth 3 (three) times daily. for anxiety     ciprofloxacin (CIPRO) 500 MG tablet Take 1 tablet (500 mg total) by mouth 2 (two) times daily. 60 tablet 11   clonazePAM (KLONOPIN) 0.5 MG tablet Take 0.5 tablets (0.25 mg total) by mouth every 6 (six) hours as needed for anxiety. 60 tablet 5   dextromethorphan-guaiFENesin (MUCINEX DM) 30-600 MG 12hr tablet Take 2 tablets by mouth 2 (two) times daily.     dronabinol (MARINOL) 2.5 MG capsule TAKE 1 CAPSULE (2.5 MG TOTAL) BY MOUTH 2 (TWO) TIMES DAILY BEFORE A MEAL. 60 capsule 5   famotidine (PEPCID) 40 MG tablet Take 1 tablet (40 mg total) by mouth 2 (two)  times daily. 60 tablet 5   fentaNYL (DURAGESIC) 75 MCG/HR Place 1 patch onto the skin every other day. 15 patch 0   gabapentin (NEURONTIN) 300 MG capsule Take 1 capsule (300 mg total) by mouth 3 (three) times daily. 90 capsule 3   HYDROmorphone (DILAUDID) 4 MG tablet Take 1-1.5 tablets (4-6 mg total) by mouth every 4 (four) hours as needed for breakthrough pain 260 tablet 0   lidocaine (LIDODERM) 5 % PLACE 3 PATCHES ONTO THE SKIN EVERY 12  HOURS. REMOVE & DISCARD PATCH WITHIN 12 HOURS OR AS DIRECTED BY MD 90 patch 5   melatonin ([employer] MELATONIN) 3 MG TABS tablet Take 1 tablet (3 mg total) by mouth at bedtime. 10 tablet 0   methocarbamol (ROBAXIN) 500 MG tablet TAKE 1 TABLET BY MOUTH EVERY 6 HOURS AS NEEDED FOR MUSCLE SPASMS 100 tablet 11   mirabegron ER (MYRBETRIQ) 50 MG TB24 tablet Take 50 mg by mouth daily.     Multiple Vitamin (MULTIVITAMIN WITH MINERALS) TABS tablet Take 1 tablet by mouth daily.     naloxegol oxalate (MOVANTIK) 12.5 MG TABS tablet Take 1 tablet (12.5 mg total) by mouth daily. 30 tablet 11   naloxone (NARCAN) nasal spray 4 mg/0.1 mL To use if pt develops unconsciousness or confusion that family thinks is related to opioids. 1 each 3   oxybutynin (DITROPAN) 5 MG tablet Take 5 mg by mouth See admin instructions. Take 5 mg by mouth three times a day and an additional 5 mg once daily as needed for urinary urgency     PARoxetine (PAXIL) 40 MG tablet Take 1 tablet (40 mg total) by mouth at bedtime. 90 tablet 2   Probiotic Product (PROBIOTIC COLON SUPPORT) CAPS Take 1 capsule by mouth daily.     promethazine (PHENERGAN) 12.5 MG tablet TAKE 1 TABLET (12.5 MG TOTAL) BY MOUTH EVERY 6 (SIX) HOURS AS NEEDED FOR NAUSEA, VOMITING OR REFRACTORY NAUSEA / VOMITING. 90 tablet 5   senna-docusate (SENOKOT-S) 8.6-50 MG tablet Take 2 tablets by mouth 3 (three) times daily.     traZODone (DESYREL) 100 MG tablet TAKE 1 TO 2 TABLETS AT BEDTIME 180 tablet 1   vancomycin (VANCOCIN) 125 MG capsule Take 1 capsule (125 mg total) by mouth in the morning and at bedtime. 90 capsule 1   Vitamin D, Ergocalciferol, (DRISDOL) 1.25 MG (50000 UNIT) CAPS capsule Take 1 capsule (50,000 Units total) by mouth every 7 (seven) days. 5 capsule 0   No facility-administered medications prior to visit.     Past Medical History:  Diagnosis Date   Acute on chronic respiratory failure with hypoxia (HCC) 06/2018   trach removed 11-16-2018, on vent from jan until  may 2020 - uses albuterol prn   Anxiety    Bacteremia due to Pseudomonas 06/2018   Chronic osteomyelitis (HCC)    Chronic pain syndrome    Clostridium difficile colitis 10/30/2019   tx with abx    Depression    DVT (deep venous thrombosis) (HCC) 2020   right brachial post PICC line   History of blood transfusion 06/2018   History of Clostridioides difficile colitis    History of kidney stones    Hypertension    norvasc d/c by pcp on 11/05/19   Multiple traumatic injuries    Penile pain 11/18/2019   Pneumonia 11/2009   2020 x 2   Walker as ambulation aid    Wheelchair bound    electric   Wound discharge    left hip  wound with bloody/clear drainage change dressing q day surgilube with gauze, between legs wound using calcium algenate pad bid     Past Surgical History:  Procedure Laterality Date   APPLICATION OF A-CELL OF BACK N/A 08/06/2018   Procedure: Application Of A-Cell Of Back;  Surgeon: Peggye Form, DO;  Location: [facility] OR;  Service: Plastics;  Laterality: N/A;   APPLICATION OF A-CELL OF EXTREMITY Left 08/06/2018   Procedure: Application Of A-Cell Of Extremity;  Surgeon: Peggye Form, DO;  Location: [facility] OR;  Service: Plastics;  Laterality: Left;   APPLICATION OF A-CELL OF EXTREMITY Left 09/18/2019   Procedure: APPLICATION OF A-CELL OF EXTREMITY;  Surgeon: Peggye Form, DO;  Location: [facility] OR;  Service: Plastics;  Laterality: Left;   APPLICATION OF WOUND VAC  07/12/2018   Procedure: Application Of Wound Vac to the Left Thigh and Scrotum.;  Surgeon: Roby Lofts, MD;  Location: [facility] OR;  Service: Orthopedics;;   APPLICATION OF WOUND VAC  07/10/2018   Procedure: Application Of Wound Vac;  Surgeon: Berna Bue, MD;  Location: [facility] OR;  Service: General;;   COLON SURGERY  2020   colostomy   COLOSTOMY N/A 07/23/2018   Procedure: COLOSTOMY;  Surgeon: Violeta Gelinas, MD;  Location: [facility] OR;  Service: General;  Laterality: N/A;   CYSTOSCOPY W/ URETERAL STENT  PLACEMENT N/A 07/15/2018   Procedure: RETROGRADE URETHROGRAM;  Surgeon: Marcine Matar, MD;  Location[facility];  Service: Urology;  Laterality: N/A;   CYSTOSCOPY WITH LITHOLAPAXY N/A 05/06/2019   Procedure: CYSTOSCOPY BASKET BLADDER STONE EXTRACTION;  Surgeon: Malen Gauze, MD;  Location: [facility];  Service: Urology;  Laterality: N/A;  30 MINS   CYSTOSTOMY N/A 05/06/2019   Procedure: REPLACEMENT OF SUPRAPUBIC CATHETER;  Surgeon: Malen Gauze, MD;  Location: [facility];  Service: Urology;  Laterality: N/A;   DEBRIDEMENT AND CLOSURE WOUND Left 03/04/2019   Procedure: Excision of hip wound with placement of Acell;  Surgeon: Peggye Form, DO;  [facility];  Service: Plastics;  Laterality: Left;   ESOPHAGOGASTRODUODENOSCOPY N/A 08/14/2018   Procedure: ESOPHAGOGASTRODUODENOSCOPY (EGD);  Surgeon: Violeta Gelinas, MD;  Location: [facility] ENDOSCOPY;  Service: General;  Laterality: N/A;  bedside   FACIAL RECONSTRUCTION SURGERY     X 2--once as a teenager and second time in his 59's   HARDWARE REMOVAL Left 03/04/2019   Procedure: Left Hip Hardware Removal;  Surgeon: Roby Lofts, MD;  [facility];  Service: Orthopedics;  Laterality: Left;   HIP PINNING,CANNULATED Left 07/12/2018   Procedure: CANNULATED HIP PINNING;  Surgeon: Roby Lofts, MD;  [facility];  Service: Orthopedics;  Laterality: Left;   HIP SURGERY     HOLMIUM LASER APPLICATION Right 07/15/2019   Procedure: HOLMIUM LASER APPLICATION;  Surgeon: Malen Gauze, MD;  Location: [facility];  Service: Urology;  Laterality: Right;   I & D EXTREMITY Left 07/25/2018   Procedure: Debridement of buttock, scrotum and left leg, placement of acell and vac;  Surgeon: Peggye Form, DO;  [facility];  Service: Plastics;  Laterality: Left;   I & D EXTREMITY N/A 08/06/2018   Procedure: Debridement of buttock, scrotum and left leg;  Surgeon: Peggye Form, DO;   [facility];  Service: Plastics;  Laterality: N/A;   I & D EXTREMITY N/A 08/13/2018   Procedure: Debridement of buttock, scrotum and left leg, placement of acell and vac;  Surgeon: Peggye Form, DO;  [facility];  Service: Plastics;  Laterality: N/A;  90 min, please   INCISION AND DRAINAGE HIP Left 09/18/2019   Procedure: IRRIGATION AND DEBRIDEMENT HIP/ PELVIS WITH WOUND VAC PLACEMENT;  Surgeon: Roby Lofts, MD;  [facility]: [facility];  Service: Orthopedics;  Laterality: Left;   INCISION AND DRAINAGE OF WOUND N/A 07/18/2018   Procedure: Debridement of left leg, buttocks and scrotal wound with placement of acell and Flexiseal;  Surgeon: Peggye Form, DO;  [facility];  Service: Plastics;  Laterality: N/A;   INCISION AND DRAINAGE OF WOUND Left 08/29/2018   Procedure: Debridement of buttock, scrotum and left leg, placement of acell and vac;  Surgeon: Peggye Form, DO;  [facility]: [facility];  Service: Plastics;  Laterality: Left;  75 min, please   INCISION AND DRAINAGE OF WOUND Bilateral 10/23/2018   Procedure: DEBRIDEMENT OF BUTTOCK,SCROTUM, AND LEG WOUNDS WITH PLACEMENT OF ACELL- BILATERAL 90 MIN;  Surgeon: Peggye Form, DO;  [facility]: [facility];  Service: Plastics;  Laterality: Bilateral;   IR ANGIOGRAM PELVIS SELECTIVE OR SUPRASELECTIVE  07/10/2018   IR ANGIOGRAM PELVIS SELECTIVE OR SUPRASELECTIVE  07/10/2018   IR ANGIOGRAM SELECTIVE EACH ADDITIONAL VESSEL  07/10/2018   IR EMBO ART  VEN HEMORR LYMPH EXTRAV  INC GUIDE ROADMAPPING  07/10/2018   IR GASTROSTOMY TUBE MOD SED  01/21/2021   IR NEPHROSTOMY PLACEMENT LEFT  04/05/2019   IR NEPHROSTOMY PLACEMENT RIGHT  05/31/2019   IR US GUIDE BX ASP/DRAIN  07/10/2018   IR US GUIDE VASC ACCESS RIGHT  07/10/2018   IR VENO/EXT/UNI LEFT  07/10/2018   IRRIGATION AND DEBRIDEMENT OF WOUND WITH SPLIT THICKNESS SKIN GRAFT Left 09/19/2018   Procedure: Debridement of gluteal wound with placement of acell to left leg.;  Surgeon: Peggye Form,  DO;  [facility];  Service: Plastics;  Laterality: Left;  2.5 hours, please   LAPAROTOMY N/A 07/12/2018   Procedure: EXPLORATORY LAPAROTOMY;  Surgeon: Violeta Gelinas, MD;  Location: [facility] OR;  Service: General;  Laterality: N/A;   LAPAROTOMY N/A 07/15/2018   Procedure: WOUND EXPLORATION; CLOSURE OF ABDOMEN;  Surgeon: Violeta Gelinas, MD;  Location: [facility] OR;  Service: General;  Laterality: N/A;   LAPAROTOMY  07/10/2018   Procedure: Exploratory Laparotomy;  Surgeon: Berna Bue, MD;  Location: [facility] OR;  Service: General;;   MASS EXCISION Left 09/18/2019   Procedure: EXCISION UPPER LEFT INNER THIGH WOUND;  Surgeon: Peggye Form, DO;  [facility];  Service: Plastics;  Laterality: Left;   NEPHROLITHOTOMY Right 07/15/2019   Procedure: NEPHROLITHOTOMY PERCUTANEOUS;  Surgeon: Malen Gauze, MD;  Location: [facility];  Service: Urology;  Laterality: Right;  90 MINS   PEG PLACEMENT N/A 08/14/2018   Procedure: PERCUTANEOUS ENDOSCOPIC GASTROSTOMY (PEG) PLACEMENT;  Surgeon: Violeta Gelinas, MD;  Location: [facility] ENDOSCOPY;  Service: General;  Laterality: N/A;   PERCUTANEOUS TRACHEOSTOMY N/A 08/02/2018   Procedure: PERCUTANEOUS TRACHEOSTOMY;  Surgeon: Violeta Gelinas, MD;  Location: [facility] OR;  Service: General;  Laterality: N/A;   RADIOLOGY WITH ANESTHESIA N/A 07/10/2018   Procedure: IR WITH ANESTHESIA;  Surgeon: Simonne Come, MD;  Location: [facility] OR;  Service: Radiology;  Laterality: N/A;   RADIOLOGY WITH ANESTHESIA Right 07/10/2018   Procedure: Ir With Anesthesia;  Surgeon: Simonne Come, MD;  Location: [facility] OR;  Service: Radiology;  Laterality: Right;   SCROTAL EXPLORATION N/A 07/15/2018   Procedure: SCROTUM DEBRIDEMENT;  Surgeon: Marcine Matar, MD;  Location: [facility] OR;  Service: Urology;  Laterality: N/A;   SHOULDER  SURGERY     SKIN SPLIT GRAFT Right 09/19/2018   Procedure: Skin Graft Split Thickness;  Surgeon: Peggye Form, DO;  [facility];  Service: Plastics;  Laterality: Right;    SKIN SPLIT GRAFT N/A 10/03/2018   Procedure: Split thickness skin graft to gluteal area with acell placement;  Surgeon: Peggye Form, DO;  Location[facility];  Service: Plastics;  Laterality: N/A;  3 hours, please   VACUUM ASSISTED CLOSURE CHANGE N/A 07/12/2018   Procedure: ABDOMINAL VACUUM ASSISTED CLOSURE CHANGE and abdominal washout;  Surgeon: Violeta Gelinas, MD;  Location: [facility] OR;  Service: General;  Laterality: N/A;   WOUND DEBRIDEMENT Left 07/23/2018   Procedure: DEBRIDEMENT LEFT BUTTOCK  WOUND;  Surgeon: Violeta Gelinas, MD;  Location: [facility] OR;  Service: General;  Laterality: Left;   WOUND EXPLORATION Left 07/10/2018   Procedure: WOUND EXPLORATION LEFT GROIN;  Surgeon: Berna Bue, MD;  Location: [facility] OR;  Service: General;  Laterality: Left;       Review of Systems  Unable to perform ROS: Mental status change     Objective:    Nursing note and vital signs reviewed.    Spoke with wife [name] as Christopher [name] has confusion. Per [facility] he is in no apparent distress but remains in pain.  Assessment & Plan:   Problem List Items Addressed This Visit       Musculoskeletal and Integument   Osteomyelitis (HCC) - Primary    Christopher [name] continues to have increasing pain complicated by decreased mentation with continued treatment of traumatic osteomyelitis with multi-drug resistant Pseudomonas. Tolerating his current ciprofloxacin and oral vancomycin. Decreased PO intake noted and G tube inserted. Okay to crush ciprofloxacin if needed and can change vancomycin oral to liquid if needed. At this point I am concerned for worsening infection with decreased ability of the ciprofloxacin to suppress infection. Will continue with current dose of ciprofloxacin and oral vancomycin. Await Palliative Care consult. Have sent a message to Dr. Phillips Odor about best way to coordinate this.         Other   Chronic pain due to trauma    Pain is currently managed by Physical Medicine and  Rehabilitation. [name] is working to provide comfort with current regimen and trying to avoid possibility of oversedation.       Protein-calorie malnutrition, severe    Christopher [name] continues to have decreased PO intake and had G tube inserted per Gastroenterology. Tube feeds (Jevity) should be arriving this afternoon. Ideally will need RD evaluation to provide appropriate caloric recommendations. Without adequate nutrition he is likely to continue worsening with increased risk of infection and worsening of wounds.         I am having Mace B. Vinzant maintain his multivitamin with minerals, dronabinol, melatonin, Blood Pressure Cuff, mirabegron ER, oxybutynin, naloxone, dextromethorphan-guaiFENesin, Probiotic Colon Support, ciprofloxacin, lidocaine, senna-docusate, feeding supplement (PRO-STAT 64), naloxegol oxalate, clonazePAM, promethazine, busPIRone, vancomycin, Vitamin D (Ergocalciferol), PARoxetine, methocarbamol, gabapentin, traZODone, albuterol, famotidine, ARIPiprazole, fentaNYL, and HYDROmorphone.   I discussed the assessment and treatment plan with the patient. The patient was provided an opportunity to ask questions and all were answered. The patient agreed with the plan and demonstrated an understanding of the instructions.   The patient was advised to call back or seek an in-person evaluation if the symptoms worsen or if the condition fails to improve as anticipated.   I provided  13  minutes of non-face-to-face time during this encounter.  Follow-up: Pending Palliative Care consult.  Terri Piedra, MSN, FNP-C Nurse Practitioner [facility] [place] number: [phone]

## 2021-01-26 NOTE — Telephone Encounter (Signed)
Patient's wife calling back stating they received a call from [facility] Nutritionist stating they do not mange tube feeding.  Please advise.

## 2021-01-26 NOTE — Telephone Encounter (Signed)
Responded to patient's wife via my chart earlier today. I spoke with a dietician at [facility] to see if they had any recommendations. Registered Dietician, Posey Rea assisted in getting the patient's information to [facility] [phone]). Selena Batten is a Scientific laboratory technician at [employer] who will do an assessment and follow up with the patient in regards to in home tube feedings. A referral has already been placed for the patient, they should contact the wife. I have sent an e-mail to [facility] to follow up on this. I spoke with patient's wife and she is aware that once Dr. Myrtie Neither returns next week he can further advise on how he would like to proceed with patient's tube feedings. Pt's wife is aware that he may contact patient's PCP to discuss further. Pt's wife verbalized understanding and had no concerns at the end of the call.

## 2021-01-26 NOTE — Assessment & Plan Note (Signed)
Christopher [name] continues to have decreased PO intake and had G tube inserted per Gastroenterology. Tube feeds (Jevity) should be arriving this afternoon. Ideally will need RD evaluation to provide appropriate caloric recommendations. Without adequate nutrition he is likely to continue worsening with increased risk of infection and worsening of wounds.

## 2021-01-26 NOTE — Assessment & Plan Note (Signed)
Christopher [name] continues to have increasing pain complicated by decreased mentation with continued treatment of traumatic osteomyelitis with multi-drug resistant Pseudomonas. Tolerating his current ciprofloxacin and oral vancomycin. Decreased PO intake noted and G tube inserted. Okay to crush ciprofloxacin if needed and can change vancomycin oral to liquid if needed. At this point I am concerned for worsening infection with decreased ability of the ciprofloxacin to suppress infection. Will continue with current dose of ciprofloxacin and oral vancomycin. Await Palliative Care consult. Have sent a message to Dr. Phillips Odor about best way to coordinate this.

## 2021-02-01 ENCOUNTER — Other Ambulatory Visit: Payer: Self-pay | Admitting: Physical Medicine and Rehabilitation

## 2021-02-01 NOTE — Telephone Encounter (Signed)
I reviewed all the notes, I spoke to Dr. Adela Lank earlier today, and I just spoke with Mr. Christopher [name] wife on the phone about this.  Please contact the dietitian with tube feed experience mentioned in the note who will be doing his evaluation and find out how best for them to communicate their assessment to [place].  (Probably fax since they are most likely not on epic) I need to know the dietitian's recommendation on Christopher [name] daily caloric requirements, particular considering his current malnutrition, and therefore best choice of feeding tube formula and total daily volume requirement.  When they send that to me, I can then write the orders for it.  - HD

## 2021-02-03 NOTE — Telephone Encounter (Signed)
Lm on vm for Montine Circle to return call to discuss.

## 2021-02-03 NOTE — Telephone Encounter (Signed)
Thank you for the update.  Presumably the dietician has calculated Christopher [name] caloric requirements, will follow his weight and reassess as needed and then let us know.  - HD

## 2021-02-03 NOTE — Telephone Encounter (Signed)
Spoke with Selena Batten, dietician at [facility]. She states that patient is tolerating the Jevity (1.5 cal) at 50 ml/hour along with the additional protein. She states that he is receiving just under 1900 calories in total. She states that orders are usually good for 1 year unless she makes any adjustments. Patient just started feedings last week so she will re-assess in 2-3 weeks and will make any adjustments if necessary. I have provided her with our fax number so that she can fax [place] her assessments.

## 2021-02-03 NOTE — Telephone Encounter (Signed)
That is correct, all information should be in her assessment.

## 2021-02-04 NOTE — Telephone Encounter (Signed)
Received assessment from Selena Batten, RD at [facility]. This report has been placed in your IN box for review. Thanks

## 2021-02-10 ENCOUNTER — Encounter
Payer: No Typology Code available for payment source | Attending: Physical Medicine and Rehabilitation | Admitting: Physical Medicine and Rehabilitation

## 2021-02-10 ENCOUNTER — Other Ambulatory Visit: Payer: Self-pay

## 2021-02-10 ENCOUNTER — Other Ambulatory Visit: Payer: Self-pay | Admitting: Physical Medicine and Rehabilitation

## 2021-02-10 ENCOUNTER — Encounter: Payer: Self-pay | Admitting: Physical Medicine and Rehabilitation

## 2021-02-10 VITALS — BP 112/77 | HR 95 | Temp 98.0°F | Ht 76.0 in

## 2021-02-10 DIAGNOSIS — K5903 Drug induced constipation: Secondary | ICD-10-CM | POA: Insufficient documentation

## 2021-02-10 DIAGNOSIS — G8921 Chronic pain due to trauma: Secondary | ICD-10-CM | POA: Diagnosis not present

## 2021-02-10 DIAGNOSIS — J9611 Chronic respiratory failure with hypoxia: Secondary | ICD-10-CM | POA: Diagnosis present

## 2021-02-10 DIAGNOSIS — Z933 Colostomy status: Secondary | ICD-10-CM | POA: Diagnosis present

## 2021-02-10 DIAGNOSIS — E43 Unspecified severe protein-calorie malnutrition: Secondary | ICD-10-CM | POA: Diagnosis present

## 2021-02-10 DIAGNOSIS — F411 Generalized anxiety disorder: Secondary | ICD-10-CM | POA: Insufficient documentation

## 2021-02-10 DIAGNOSIS — Z993 Dependence on wheelchair: Secondary | ICD-10-CM | POA: Insufficient documentation

## 2021-02-10 DIAGNOSIS — M8669 Other chronic osteomyelitis, multiple sites: Secondary | ICD-10-CM | POA: Diagnosis present

## 2021-02-10 DIAGNOSIS — T402X5A Adverse effect of other opioids, initial encounter: Secondary | ICD-10-CM | POA: Insufficient documentation

## 2021-02-10 MED ORDER — CLONAZEPAM 0.5 MG PO TABS: 0.2500 mg | ORAL_TABLET | Freq: Four times a day (QID) | ORAL | 5 refills | Status: DC | PRN

## 2021-02-10 NOTE — Progress Notes (Signed)
Subjective:    Patient ID: Christopher [name], male    DOB: 03-23-1966, 55 y.o.   MRN: [id number]  HPI  Pt is a 55 yr old male with hx of Multitrauma- causing L femoral neck fx, degloving of L hip to going/scrotum, bladder neck trauma- got SPC, developed compartment syndrome -s/p surgery for that; also diverting colostomy, skin grafts, and s/p trach and PEG- PEG is out. 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 S/P screw removal and on IV ABX for L hip osteomyelitis. 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  B/L tennis elbow new-and B/L pending/forming ulnar neuropathy Osteomyelitis of L hip found again-on  PO vanc and Cipro    Wasn't eating because stomach hurt, but since started Tfs, has been a different person!  Has gained some weight- )by appearance), but looks better per wife and my eyes.   Pain 8/10- has it's times when spikes, but getting up in w/c and sitting outside.   Fentanyl patch Dilaudid 6 mg q4 hours- around the clock- sometimes won't give it, unless he asks for it. Doesn't give past midnight- but sleepy- gets night med at 12am.  Wakes around 4-6 am, and is hurting. But that's makes sense.   Been eating my mouth better- small amounts- is eating for desire/want, not need now.   TF's- on Jevity 1.5- changed 2 days ago- 60cc- constant for 24 hours. Flushes throughout the day.  Feels like drowning from the inside when flushed.   Drinking water and diet mountain dew-  2 bottles of water/day.   Wakes up and notices O2 off- gets dizzy and scared. - usually on 3L. If brings down to 2L, panics and it starts.  Cannot tolerate without O2- pulse ox will drop to 88% on RA.  Has f/u with pulmonary sometimes soon.  Gets dizzy and scared when it comes off.   Still confused frequently-  After takes a nap- off balance when sits on EOB- per pt - he  thinks gets better after eats? Pt's wife disagrees (she said he said she was trying to kill him and was cheating on him)  even now sometimes per pt's wife.  Says other words from what he means- and goes off tangents lots of times per wife.  Less hallucinations than before, usually.      Pain Inventory Average Pain 8 Pain Right Now 9 My pain is sharp, burning, dull, stabbing, tingling, and aching  In the last 24 hours, has pain interfered with the following? General activity 8 Relation with others 8 Enjoyment of life 8 What TIME of day is your pain at its worst? morning , daytime, evening, and night Sleep (in general) Fair  Pain is worse with: walking, bending, sitting, inactivity, standing, and some activites Pain improves with: rest and medication Relief from Meds:  not answered  Family History  Problem Relation Age of Onset   Breast cancer Mother        with mets to the bones   Social History   Socioeconomic History   Marital status: Married    Spouse name: Not on file   Number of children: Not on file   Years of education: Not on file   Highest education level: Not on file  Occupational History   Occupation: Disable  Tobacco Use   Smoking status: Former  Packs/day: 1.00    Years: 20.00    Pack years: 20.00    Types: Cigarettes    Quit date: 07/10/2018    Years since quitting: 2.5   Smokeless tobacco: Never  Vaping Use   Vaping Use: Never used  Substance and Sexual Activity   Alcohol use: Never   Drug use: Yes    Types: Oxycodone, Fentanyl    Comment: Fentanyl patch/oxycodone since 06/2018   Sexual activity: Yes  Other Topics Concern   Not on file  Social History Narrative   ** Merged History Encounter **       Social Determinants of Health   Financial Resource Strain: Not on file  Food Insecurity: Not on file  Transportation Needs: Not on file  Physical Activity: Not on file  Stress: Not on file  Social Connections: Not on file   Past Surgical  History:  Procedure Laterality Date   APPLICATION OF A-CELL OF BACK N/A 08/06/2018   Procedure: Application Of A-Cell Of Back;  Surgeon: Peggye Form, DO;  [facility];  Service: Plastics;  Laterality: N/A;   APPLICATION OF A-CELL OF EXTREMITY Left 08/06/2018   Procedure: Application Of A-Cell Of Extremity;  Surgeon: Peggye Form, DO;  [facility];  Service: Plastics;  Laterality: Left;   APPLICATION OF A-CELL OF EXTREMITY Left 09/18/2019   Procedure: APPLICATION OF A-CELL OF EXTREMITY;  Surgeon: Peggye Form, DO;  [facility];  Service: Plastics;  Laterality: Left;   APPLICATION OF WOUND VAC  07/12/2018   Procedure: Application Of Wound Vac to the Left Thigh and Scrotum.;  Surgeon: Roby Lofts, MD;  [facility];  Service: Orthopedics;;   APPLICATION OF WOUND VAC  07/10/2018   Procedure: Application Of Wound Vac;  Surgeon: Berna Bue, MD;  Location[facility] OR;  Service: General;;   COLON SURGERY  2020   colostomy   COLOSTOMY N/A 07/23/2018   Procedure: COLOSTOMY;  Surgeon: Violeta Gelinas, MD;  Location: [facility] OR;  Service: General;  Laterality: N/A;   CYSTOSCOPY W/ URETERAL STENT PLACEMENT N/A 07/15/2018   Procedure: RETROGRADE URETHROGRAM;  Surgeon: Marcine Matar, MD;  [facility] OR;  Service: Urology;  Laterality: N/A;   CYSTOSCOPY WITH LITHOLAPAXY N/A 05/06/2019   Procedure: CYSTOSCOPY BASKET BLADDER STONE EXTRACTION;  Surgeon: Malen Gauze, MD;  Location: [facility];  Service: Urology;  Laterality: N/A;  30 MINS   CYSTOSTOMY N/A 05/06/2019   Procedure: REPLACEMENT OF SUPRAPUBIC CATHETER;  Surgeon: Malen Gauze, MD;  Location: [facility];  Service: Urology;  Laterality: N/A;   DEBRIDEMENT AND CLOSURE WOUND Left 03/04/2019   Procedure: Excision of hip wound with placement of Acell;  Surgeon: Peggye Form, DO;  [facility];  Service: Plastics;  Laterality: Left;   ESOPHAGOGASTRODUODENOSCOPY  N/A 08/14/2018   Procedure: ESOPHAGOGASTRODUODENOSCOPY (EGD);  Surgeon: Violeta Gelinas, MD;  Location: [facility] ENDOSCOPY;  Service: General;  Laterality: N/A;  bedside   FACIAL RECONSTRUCTION SURGERY     X 2--once as a teenager and second time in his 35's   HARDWARE REMOVAL Left 03/04/2019   Procedure: Left Hip Hardware Removal;  Surgeon: Roby Lofts, MD;  [facility];  Service: Orthopedics;  Laterality: Left;   HIP PINNING,CANNULATED Left 07/12/2018   Procedure: CANNULATED HIP PINNING;  Surgeon: Roby Lofts, MD;  [facility];  Service: Orthopedics;  Laterality: Left;   HIP SURGERY     HOLMIUM LASER APPLICATION Right 07/15/2019  Procedure: HOLMIUM LASER APPLICATION;  Surgeon: Malen Gauze, MD;  Location: [facility];  Service: Urology;  Laterality: Right;   I & D EXTREMITY Left 07/25/2018   Procedure: Debridement of buttock, scrotum and left leg, placement of acell and vac;  Surgeon: Peggye Form, DO;  [facility];  Service: Plastics;  Laterality: Left;   I & D EXTREMITY N/A 08/06/2018   Procedure: Debridement of buttock, scrotum and left leg;  Surgeon: Peggye Form, DO;  [facility];  Service: Plastics;  Laterality: N/A;   I & D EXTREMITY N/A 08/13/2018   Procedure: Debridement of buttock, scrotum and left leg, placement of acell and vac;  Surgeon: Peggye Form, DO;  [facility];  Service: Plastics;  Laterality: N/A;  90 min, please   INCISION AND DRAINAGE HIP Left 09/18/2019   Procedure: IRRIGATION AND DEBRIDEMENT HIP/ PELVIS WITH WOUND VAC PLACEMENT;  Surgeon: Roby Lofts, MD;  [facility];  Service: Orthopedics;  Laterality: Left;   INCISION AND DRAINAGE OF WOUND N/A 07/18/2018   Procedure: Debridement of left leg, buttocks and scrotal wound with placement of acell and Flexiseal;  Surgeon: Peggye Form, DO;  [facility];  Service: Plastics;  Laterality: N/A;   INCISION AND DRAINAGE OF WOUND Left 08/29/2018    Procedure: Debridement of buttock, scrotum and left leg, placement of acell and vac;  Surgeon: Peggye Form, DO;  [facility];  Service: Plastics;  Laterality: Left;  75 min, please   INCISION AND DRAINAGE OF WOUND Bilateral 10/23/2018   Procedure: DEBRIDEMENT OF BUTTOCK,SCROTUM, AND LEG WOUNDS WITH PLACEMENT OF ACELL- BILATERAL 90 MIN;  Surgeon: Peggye Form, DO;  [facility];  Service: Plastics;  Laterality: Bilateral;   IR ANGIOGRAM PELVIS SELECTIVE OR SUPRASELECTIVE  07/10/2018   IR ANGIOGRAM PELVIS SELECTIVE OR SUPRASELECTIVE  07/10/2018   IR ANGIOGRAM SELECTIVE EACH ADDITIONAL VESSEL  07/10/2018   IR EMBO ART  VEN HEMORR LYMPH EXTRAV  INC GUIDE ROADMAPPING  07/10/2018   IR GASTROSTOMY TUBE MOD SED  01/21/2021   IR NEPHROSTOMY PLACEMENT LEFT  04/05/2019   IR NEPHROSTOMY PLACEMENT RIGHT  05/31/2019   IR US GUIDE BX ASP/DRAIN  07/10/2018   IR US GUIDE VASC ACCESS RIGHT  07/10/2018   IR VENO/EXT/UNI LEFT  07/10/2018   IRRIGATION AND DEBRIDEMENT OF WOUND WITH SPLIT THICKNESS SKIN GRAFT Left 09/19/2018   Procedure: Debridement of gluteal wound with placement of acell to left leg.;  Surgeon: Peggye Form, DO;  [facility];  Service: Plastics;  Laterality: Left;  2.5 hours, please   LAPAROTOMY N/A 07/12/2018   Procedure: EXPLORATORY LAPAROTOMY;  Surgeon: Violeta Gelinas, MD;  Location: [facility] OR;  Service: General;  Laterality: N/A;   LAPAROTOMY N/A 07/15/2018   Procedure: WOUND EXPLORATION; CLOSURE OF ABDOMEN;  Surgeon: Violeta Gelinas, MD;  Location: [facility] OR;  Service: General;  Laterality: N/A;   LAPAROTOMY  07/10/2018   Procedure: Exploratory Laparotomy;  Surgeon: Berna Bue, MD;  Location: [facility] OR;  Service: General;;   MASS EXCISION Left 09/18/2019   Procedure: EXCISION UPPER LEFT INNER THIGH WOUND;  Surgeon: Peggye Form, DO;  [facility];  Service: Plastics;  Laterality: Left;   NEPHROLITHOTOMY Right 07/15/2019   Procedure: NEPHROLITHOTOMY PERCUTANEOUS;   Surgeon: Malen Gauze, MD;  Location: [facility];  Service: Urology;  Laterality: Right;  90 MINS   PEG PLACEMENT N/A 08/14/2018   Procedure: PERCUTANEOUS ENDOSCOPIC GASTROSTOMY (PEG) PLACEMENT;  Surgeon: Violeta Gelinas, MD;  Location: [facility];  Service: General;  Laterality: N/A;   PERCUTANEOUS TRACHEOSTOMY N/A 08/02/2018   Procedure: PERCUTANEOUS TRACHEOSTOMY;  Surgeon: Violeta Gelinas, MD;  Location: [facility] OR;  Service: General;  Laterality: N/A;   RADIOLOGY WITH ANESTHESIA N/A 07/10/2018   Procedure: IR WITH ANESTHESIA;  Surgeon: Simonne Come, MD;  Location: [facility] OR;  Service: Radiology;  Laterality: N/A;   RADIOLOGY WITH ANESTHESIA Right 07/10/2018   Procedure: Ir With Anesthesia;  Surgeon: Simonne Come, MD;  Location: [facility] OR;  Service: Radiology;  Laterality: Right;   SCROTAL EXPLORATION N/A 07/15/2018   Procedure: SCROTUM DEBRIDEMENT;  Surgeon: Marcine Matar, MD;  Location: [facility] OR;  Service: Urology;  Laterality: N/A;   SHOULDER SURGERY     SKIN SPLIT GRAFT Right 09/19/2018   Procedure: Skin Graft Split Thickness;  Surgeon: Peggye Form, DO;  [facility];  Service: Plastics;  Laterality: Right;   SKIN SPLIT GRAFT N/A 10/03/2018   Procedure: Split thickness skin graft to gluteal area with acell placement;  Surgeon: Peggye Form, DO;  [facility];  Service: Plastics;  Laterality: N/A;  3 hours, please   VACUUM ASSISTED CLOSURE CHANGE N/A 07/12/2018   Procedure: ABDOMINAL VACUUM ASSISTED CLOSURE CHANGE and abdominal washout;  Surgeon: Violeta Gelinas, MD;  Location: [facility] OR;  Service: General;  Laterality: N/A;   WOUND DEBRIDEMENT Left 07/23/2018   Procedure: DEBRIDEMENT LEFT BUTTOCK  WOUND;  Surgeon: Violeta Gelinas, MD;  Location: [facility] OR;  Service: General;  Laterality: Left;   WOUND EXPLORATION Left 07/10/2018   Procedure: WOUND EXPLORATION LEFT GROIN;  Surgeon: Berna Bue, MD;  Location: [facility];  Service: General;  Laterality: Left;   Past  Surgical History:  Procedure Laterality Date   APPLICATION OF A-CELL OF BACK N/A 08/06/2018   Procedure: Application Of A-Cell Of Back;  Surgeon: Peggye Form, DO;  [facility];  Service: Plastics;  Laterality: N/A;   APPLICATION OF A-CELL OF EXTREMITY Left 08/06/2018   Procedure: Application Of A-Cell Of Extremity;  Surgeon: Peggye Form, DO;  [facility];  Service: Plastics;  Laterality: Left;   APPLICATION OF A-CELL OF EXTREMITY Left 09/18/2019   Procedure: APPLICATION OF A-CELL OF EXTREMITY;  Surgeon: Peggye Form, DO;  [facility];  Service: Plastics;  Laterality: Left;   APPLICATION OF WOUND VAC  07/12/2018   Procedure: Application Of Wound Vac to the Left Thigh and Scrotum.;  Surgeon: Roby Lofts, MD;  [facility];  Service: Orthopedics;;   APPLICATION OF WOUND VAC  07/10/2018   Procedure: Application Of Wound Vac;  Surgeon: Berna Bue, MD;  Location: [facility] OR;  Service: General;;   COLON SURGERY  2020   colostomy   COLOSTOMY N/A 07/23/2018   Procedure: COLOSTOMY;  Surgeon: Violeta Gelinas, MD;  Location: [facility] OR;  Service: General;  Laterality: N/A;   CYSTOSCOPY W/ URETERAL STENT PLACEMENT N/A 07/15/2018   Procedure: RETROGRADE URETHROGRAM;  Surgeon: Marcine Matar, MD;  Location: [facility] OR;  Service: Urology;  Laterality: N/A;   CYSTOSCOPY WITH LITHOLAPAXY N/A 05/06/2019   Procedure: CYSTOSCOPY BASKET BLADDER STONE EXTRACTION;  Surgeon: Malen Gauze, MD;  Location: [facility];  Service: Urology;  Laterality: N/A;  30 MINS   CYSTOSTOMY N/A 05/06/2019   Procedure: REPLACEMENT OF SUPRAPUBIC CATHETER;  Surgeon: Malen Gauze, MD;  [facility];  Service: Urology;  Laterality: N/A;   DEBRIDEMENT AND CLOSURE WOUND Left 03/04/2019   Procedure:  Excision of hip wound with placement of Acell;  Surgeon: Peggye Form, DO;  [facility];  Service: Plastics;  Laterality: Left;    ESOPHAGOGASTRODUODENOSCOPY N/A 08/14/2018   Procedure: ESOPHAGOGASTRODUODENOSCOPY (EGD);  Surgeon: Violeta Gelinas, MD;  Location: [facility] ENDOSCOPY;  Service: General;  Laterality: N/A;  bedside   FACIAL RECONSTRUCTION SURGERY     X 2--once as a teenager and second time in his 78's   HARDWARE REMOVAL Left 03/04/2019   Procedure: Left Hip Hardware Removal;  Surgeon: Roby Lofts, MD;  [facility];  Service: Orthopedics;  Laterality: Left;   HIP PINNING,CANNULATED Left 07/12/2018   Procedure: CANNULATED HIP PINNING;  Surgeon: Roby Lofts, MD;  [facility];  Service: Orthopedics;  Laterality: Left;   HIP SURGERY     HOLMIUM LASER APPLICATION Right 07/15/2019   Procedure: HOLMIUM LASER APPLICATION;  Surgeon: Malen Gauze, MD;  Location: [facility];  Service: Urology;  Laterality: Right;   I & D EXTREMITY Left 07/25/2018   Procedure: Debridement of buttock, scrotum and left leg, placement of acell and vac;  Surgeon: Peggye Form, DO;  [facility];  Service: Plastics;  Laterality: Left;   I & D EXTREMITY N/A 08/06/2018   Procedure: Debridement of buttock, scrotum and left leg;  Surgeon: Peggye Form, DO;  [facility];  Service: Plastics;  Laterality: N/A;   I & D EXTREMITY N/A 08/13/2018   Procedure: Debridement of buttock, scrotum and left leg, placement of acell and vac;  Surgeon: Peggye Form, DO;  [facility];  Service: Plastics;  Laterality: N/A;  90 min, please   INCISION AND DRAINAGE HIP Left 09/18/2019   Procedure: IRRIGATION AND DEBRIDEMENT HIP/ PELVIS WITH WOUND VAC PLACEMENT;  Surgeon: Roby Lofts, MD;  [facility];  Service: Orthopedics;  Laterality: Left;   INCISION AND DRAINAGE OF WOUND N/A 07/18/2018   Procedure: Debridement of left leg, buttocks and scrotal wound with placement of acell and Flexiseal;  Surgeon: Peggye Form, DO;  [facility];  Service: Plastics;  Laterality: N/A;   INCISION AND DRAINAGE OF  WOUND Left 08/29/2018   Procedure: Debridement of buttock, scrotum and left leg, placement of acell and vac;  Surgeon: Peggye Form, DO;  [facility];  Service: Plastics;  Laterality: Left;  75 min, please   INCISION AND DRAINAGE OF WOUND Bilateral 10/23/2018   Procedure: DEBRIDEMENT OF BUTTOCK,SCROTUM, AND LEG WOUNDS WITH PLACEMENT OF ACELL- BILATERAL 90 MIN;  Surgeon: Peggye Form, DO;  [facility];  Service: Plastics;  Laterality: Bilateral;   IR ANGIOGRAM PELVIS SELECTIVE OR SUPRASELECTIVE  07/10/2018   IR ANGIOGRAM PELVIS SELECTIVE OR SUPRASELECTIVE  07/10/2018   IR ANGIOGRAM SELECTIVE EACH ADDITIONAL VESSEL  07/10/2018   IR EMBO ART  VEN HEMORR LYMPH EXTRAV  INC GUIDE ROADMAPPING  07/10/2018   IR GASTROSTOMY TUBE MOD SED  01/21/2021   IR NEPHROSTOMY PLACEMENT LEFT  04/05/2019   IR NEPHROSTOMY PLACEMENT RIGHT  05/31/2019   IR US GUIDE BX ASP/DRAIN  07/10/2018   IR US GUIDE VASC ACCESS RIGHT  07/10/2018   IR VENO/EXT/UNI LEFT  07/10/2018   IRRIGATION AND DEBRIDEMENT OF WOUND WITH SPLIT THICKNESS SKIN GRAFT Left 09/19/2018   Procedure: Debridement of gluteal wound with placement of acell to left leg.;  Surgeon: Peggye Form, DO;  [facility];  Service: Plastics;  Laterality: Left;  2.5 hours, please   LAPAROTOMY N/A 07/12/2018   Procedure: EXPLORATORY  LAPAROTOMY;  Surgeon: Violeta Gelinashompson, Burke, MD;  [facility];  Service: General;  Laterality: N/A;   LAPAROTOMY N/A 07/15/2018   Procedure: WOUND EXPLORATION; CLOSURE OF ABDOMEN;  Surgeon: Violeta Gelinashompson, Burke, MD;  [facility];  Service: General;  Laterality: N/A;   LAPAROTOMY  07/10/2018   Procedure: Exploratory Laparotomy;  Surgeon: Berna Bueonnor, Chelsea A, MD;  [facility];  Service: General;;   MASS EXCISION Left 09/18/2019   Procedure: EXCISION UPPER LEFT INNER THIGH WOUND;  Surgeon: Peggye Formillingham, Claire S, DO;  [facility];  Service: Plastics;  Laterality: Left;   NEPHROLITHOTOMY Right 07/15/2019   Procedure:  NEPHROLITHOTOMY PERCUTANEOUS;  Surgeon: Malen GauzeMcKenzie, Patrick L, MD;  [facility];  Service: Urology;  Laterality: Right;  90 MINS   PEG PLACEMENT N/A 08/14/2018   Procedure: PERCUTANEOUS ENDOSCOPIC GASTROSTOMY (PEG) PLACEMENT;  Surgeon: Violeta Gelinashompson, Burke, MD;  [facility];  Service: General;  Laterality: N/A;   PERCUTANEOUS TRACHEOSTOMY N/A 08/02/2018   Procedure: PERCUTANEOUS TRACHEOSTOMY;  Surgeon: Violeta Gelinashompson, Burke, MD;  [facility];  Service: General;  Laterality: N/A;   RADIOLOGY WITH ANESTHESIA N/A 07/10/2018   Procedure: IR WITH ANESTHESIA;  Surgeon: Simonne ComeWatts, John, MD;  [facility];  Service: Radiology;  Laterality: N/A;   RADIOLOGY WITH ANESTHESIA Right 07/10/2018   Procedure: Ir With Anesthesia;  Surgeon: Simonne ComeWatts, John, MD;  [facility];  Service: Radiology;  Laterality: Right;   SCROTAL EXPLORATION N/A 07/15/2018   Procedure: SCROTUM DEBRIDEMENT;  Surgeon: Marcine Matarahlstedt, Stephen, MD;  [facility];  Service: Urology;  Laterality: N/A;   SHOULDER SURGERY     SKIN SPLIT GRAFT Right 09/19/2018   Procedure: Skin Graft Split Thickness;  Surgeon: Peggye Formillingham, Claire S, DO;  [facility];  Service: Plastics;  Laterality: Right;   SKIN SPLIT GRAFT N/A 10/03/2018   Procedure: Split thickness skin graft to gluteal area with acell placement;  Surgeon: Peggye Formillingham, Claire S, DO;  [facility];  Service: Plastics;  Laterality: N/A;  3 hours, please   VACUUM ASSISTED CLOSURE CHANGE N/A 07/12/2018   Procedure: ABDOMINAL VACUUM ASSISTED CLOSURE CHANGE and abdominal washout;  Surgeon: Violeta Gelinashompson, Burke, MD;  [facility];  Service: General;  Laterality: N/A;   WOUND DEBRIDEMENT Left 07/23/2018   Procedure: DEBRIDEMENT LEFT BUTTOCK  WOUND;  Surgeon: Violeta Gelinashompson, Burke, MD;  [facility];  Service: General;  Laterality: Left;   WOUND EXPLORATION Left 07/10/2018   Procedure: WOUND EXPLORATION LEFT GROIN;  Surgeon: Berna Bueonnor, Chelsea A, MD;  [facility];  Service: General;   Laterality: Left;   Past Medical History:  Diagnosis Date   Acute on chronic respiratory failure with hypoxia (HCC) 06/2018   trach removed 11-16-2018, on vent from jan until may 2020 - uses albuterol prn   Anxiety    Bacteremia due to Pseudomonas 06/2018   Chronic osteomyelitis (HCC)    Chronic pain syndrome    Clostridium difficile colitis 10/30/2019   tx with abx    Depression    DVT (deep venous thrombosis) (HCC) 2020   right brachial post PICC line   History of blood transfusion 06/2018   History of Clostridioides difficile colitis    History of kidney stones    Hypertension    norvasc d/c by pcp on 11/05/19   Multiple traumatic injuries    Penile pain 11/18/2019   Pneumonia 11/2009   2020 x 2   Walker as ambulation aid    Wheelchair bound    electric   Wound discharge  left hip wound with bloody/clear drainage change dressing q day surgilube with gauze, between legs wound using calcium algenate pad bid   BP 112/77   Pulse 95   Temp 98 F (36.7 C)   Ht 6\' 4"  (1.93 m)   SpO2 95%   BMI 18.87 kg/m  Unable to weigh, last recorded 155 lb on 01/21/21 Opioid Risk Score:   Fall Risk Score:  `1  Depression screen PHQ 2/9  Depression screen [facility] 2/9 02/10/2021 01/01/2021 09/02/2020 07/02/2020 03/17/2020 12/30/2019 12/18/2019  Decreased Interest 3 3 1 3  0 0 0  Down, Depressed, Hopeless 3 3 1 2 1  0 0  PHQ - 2 Score 6 6 2 5 1  0 0  Altered sleeping - - - 3 - - -  Tired, decreased energy - - - 3 - - -  Change in appetite - - - 3 - - -  Feeling bad or failure about yourself  - 3 - 2 - - -  Trouble concentrating - - - 3 - - -  Moving slowly or fidgety/restless - - - 3 - - -  Suicidal thoughts - 0 - 0 - - -  PHQ-9 Score - - - 22 - - -  Difficult doing work/chores - Extremely dIfficult - - - - -  Some recent data might be hidden     Review of Systems  Constitutional: Negative.        Tuber feedings  HENT: Negative.    Eyes: Negative.   Respiratory: Negative.    Cardiovascular:  Negative.   Gastrointestinal: Negative.   Endocrine: Negative.   Genitourinary: Negative.   Musculoskeletal:  Positive for back pain and gait problem.  Skin: Negative.   Allergic/Immunologic: Negative.   Hematological: Negative.   Psychiatric/Behavioral: Negative.    All other systems reviewed and are negative.     Objective:   Physical Exam  Awake, alert, but feels a little confused per pt- tangential; in power w/c- NOT yelling/screaming in pain; Melanie in appointment; NAD Abd somewhat distended- but has colostomy and PEG noted on LLQ/LUQ Asking when can drive again- lost thoughts a few times.  Mood swings- very fast.     Assessment & Plan:    Pt is a 55 yr old male with hx of Multitrauma- causing L femoral neck fx, degloving of L hip to going/scrotum, bladder neck trauma- got SPC, developed compartment syndrome -s/p surgery for that; also diverting colostomy, skin grafts, and s/p trach and PEG- PEG is out. 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 S/P screw removal and on IV ABX for L hip osteomyelitis. 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    Con't Gabapentin 300 mg TID- has refills    2. Duragesic  and Dilaudid on 8/12- con't regimen- it's working better. Not due to refills for now.   3.  Con't Paxil, Klonopin/Buspar, - needs refills on Klonopin- 0.50 mg - #60  with 5 refills  4. Con't TF's per GI- and he's taking [facility]-   5. F/U in 6- 8 weeks- double appt.  No major changes today  6. H/H PT/OT- ordered again since can do now.   I spent  a total of 22 minutes on visit- talking with worker's comp-

## 2021-02-10 NOTE — Patient Instructions (Addendum)
Pt is a 55 yr old male with hx of Multitrauma- causing L femoral neck fx, degloving of L hip to going/scrotum, bladder neck trauma- got SPC, developed compartment syndrome -s/p surgery for that; also diverting colostomy, skin grafts, and s/p trach and PEG- PEG is out. 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 S/P screw removal and on IV ABX for L hip osteomyelitis. 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    Con't Gabapentin 300 mg TID- has refills    2. Duragesic  and Dilaudid on 8/12- con't regimen- it's working better. Not due to refills for now.   3.  Con't Paxil, Klonopin/Buspar, - needs refills on Klonopin- 0.50 mg - #60  with 5 refills  4. Con't TF's per GI- and he's taking [facility]-   5. F/U in 6- 8 weeks- double appt. 6. H/H PT/OT- ordered again since can do now. - max 5 lbs- start with 1 lb.

## 2021-02-18 ENCOUNTER — Other Ambulatory Visit: Payer: Self-pay | Admitting: Physical Medicine and Rehabilitation

## 2021-02-18 DIAGNOSIS — R451 Restlessness and agitation: Secondary | ICD-10-CM

## 2021-02-18 DIAGNOSIS — Z789 Other specified health status: Secondary | ICD-10-CM

## 2021-02-18 MED ORDER — QUETIAPINE FUMARATE 25 MG PO TABS: 25.0000 mg | ORAL_TABLET | Freq: Two times a day (BID) | ORAL | 1 refills | Status: DC

## 2021-02-22 DIAGNOSIS — Z87442 Personal history of urinary calculi: Secondary | ICD-10-CM

## 2021-02-22 DIAGNOSIS — I10 Essential (primary) hypertension: Secondary | ICD-10-CM

## 2021-02-22 DIAGNOSIS — F419 Anxiety disorder, unspecified: Secondary | ICD-10-CM

## 2021-02-22 DIAGNOSIS — G4700 Insomnia, unspecified: Secondary | ICD-10-CM

## 2021-02-22 DIAGNOSIS — T50905D Adverse effect of unspecified drugs, medicaments and biological substances, subsequent encounter: Secondary | ICD-10-CM

## 2021-02-22 DIAGNOSIS — Z9981 Dependence on supplemental oxygen: Secondary | ICD-10-CM

## 2021-02-22 DIAGNOSIS — G894 Chronic pain syndrome: Secondary | ICD-10-CM

## 2021-02-22 DIAGNOSIS — Z87440 Personal history of urinary (tract) infections: Secondary | ICD-10-CM

## 2021-02-22 DIAGNOSIS — Z87891 Personal history of nicotine dependence: Secondary | ICD-10-CM

## 2021-02-22 DIAGNOSIS — F1120 Opioid dependence, uncomplicated: Secondary | ICD-10-CM

## 2021-02-22 DIAGNOSIS — M8668 Other chronic osteomyelitis, other site: Secondary | ICD-10-CM

## 2021-02-22 DIAGNOSIS — Z79899 Other long term (current) drug therapy: Secondary | ICD-10-CM

## 2021-02-22 DIAGNOSIS — L89152 Pressure ulcer of sacral region, stage 2: Secondary | ICD-10-CM

## 2021-02-22 DIAGNOSIS — D649 Anemia, unspecified: Secondary | ICD-10-CM

## 2021-02-22 DIAGNOSIS — T8452XD Infection and inflammatory reaction due to internal left hip prosthesis, subsequent encounter: Secondary | ICD-10-CM

## 2021-02-22 DIAGNOSIS — Z933 Colostomy status: Secondary | ICD-10-CM

## 2021-02-22 DIAGNOSIS — Z792 Long term (current) use of antibiotics: Secondary | ICD-10-CM

## 2021-02-22 DIAGNOSIS — E876 Hypokalemia: Secondary | ICD-10-CM

## 2021-02-22 DIAGNOSIS — Z435 Encounter for attention to cystostomy: Secondary | ICD-10-CM

## 2021-02-22 DIAGNOSIS — F32A Depression, unspecified: Secondary | ICD-10-CM

## 2021-02-22 DIAGNOSIS — K5903 Drug induced constipation: Secondary | ICD-10-CM

## 2021-02-22 DIAGNOSIS — Z466 Encounter for fitting and adjustment of urinary device: Secondary | ICD-10-CM

## 2021-02-22 DIAGNOSIS — R339 Retention of urine, unspecified: Secondary | ICD-10-CM

## 2021-02-22 DIAGNOSIS — G8220 Paraplegia, unspecified: Secondary | ICD-10-CM

## 2021-02-22 DIAGNOSIS — Z8701 Personal history of pneumonia (recurrent): Secondary | ICD-10-CM

## 2021-02-22 DIAGNOSIS — R1310 Dysphagia, unspecified: Secondary | ICD-10-CM

## 2021-02-22 DIAGNOSIS — K219 Gastro-esophageal reflux disease without esophagitis: Secondary | ICD-10-CM

## 2021-02-22 DIAGNOSIS — Z7951 Long term (current) use of inhaled steroids: Secondary | ICD-10-CM

## 2021-02-22 DIAGNOSIS — J9611 Chronic respiratory failure with hypoxia: Secondary | ICD-10-CM

## 2021-02-22 DIAGNOSIS — T8489XD Other specified complication of internal orthopedic prosthetic devices, implants and grafts, subsequent encounter: Secondary | ICD-10-CM

## 2021-03-02 ENCOUNTER — Telehealth: Payer: Self-pay

## 2021-03-02 ENCOUNTER — Other Ambulatory Visit: Payer: Self-pay | Admitting: Physical Medicine and Rehabilitation

## 2021-03-02 MED ORDER — HYDROMORPHONE HCL 4 MG PO TABS: ORAL_TABLET | ORAL | 0 refills | Status: DC

## 2021-03-02 NOTE — Telephone Encounter (Signed)
..  Home Health Verbal Orders  Agency:  [facility]  Caller: Allie   Contact and title  Requesting OT/ PT/ Skilled nursing/ Social Work/ Speech:  PT  Reason for Request:  Just released from Hospital from surgery  Frequency:  1 time a week for 4 weeks   HH needs F2F w/in last 30 days

## 2021-03-02 NOTE — Telephone Encounter (Signed)
Ok with me. Please place any necessary orders. 

## 2021-03-02 NOTE — Telephone Encounter (Signed)
Please advise 

## 2021-03-02 NOTE — Telephone Encounter (Signed)
Melanie-wife of patient called stating patient needs refill on Hydromorphone. Last filled #260(30 day supply) on 01/22/21. Can you address in Dr. Dahlia Client absence?

## 2021-03-03 ENCOUNTER — Telehealth: Payer: Self-pay

## 2021-03-03 NOTE — Telephone Encounter (Signed)
VO for PT 1x per week for 10 weeks given

## 2021-03-03 NOTE — Telephone Encounter (Signed)
..  Home Health Verbal Orders  Agency:  [facility]   Caller:  Corky Mull and title  Requesting OT/ PT/ Skilled nursing/ Social Work/ Speech:  Speech therapy  Reason for Request:    Frequency:  once a week for one week, twice a week for four weeks and then once a week for one week  HH needs F2F w/in last 30 days    Florentina Addison has given verbal OK for orders.

## 2021-03-13 ENCOUNTER — Other Ambulatory Visit: Payer: Self-pay | Admitting: Physical Medicine and Rehabilitation

## 2021-03-16 ENCOUNTER — Telehealth: Payer: Self-pay | Admitting: Physical Medicine and Rehabilitation

## 2021-03-16 NOTE — Telephone Encounter (Signed)
Patient needs a refill on Fentanyl Patch.  Please notify patient when this is done.

## 2021-03-17 MED ORDER — FENTANYL 75 MCG/HR TD PT72: 1.0000 | MEDICATED_PATCH | TRANSDERMAL | 0 refills | Status: DC

## 2021-03-17 NOTE — Telephone Encounter (Signed)
Notified. 

## 2021-03-23 ENCOUNTER — Encounter (HOSPITAL_BASED_OUTPATIENT_CLINIC_OR_DEPARTMENT_OTHER): Payer: No Typology Code available for payment source | Attending: Internal Medicine | Admitting: Internal Medicine

## 2021-03-23 ENCOUNTER — Other Ambulatory Visit: Payer: Self-pay

## 2021-03-23 DIAGNOSIS — X58XXXA Exposure to other specified factors, initial encounter: Secondary | ICD-10-CM | POA: Diagnosis not present

## 2021-03-23 DIAGNOSIS — Z87891 Personal history of nicotine dependence: Secondary | ICD-10-CM | POA: Diagnosis not present

## 2021-03-23 DIAGNOSIS — Z792 Long term (current) use of antibiotics: Secondary | ICD-10-CM | POA: Diagnosis not present

## 2021-03-23 DIAGNOSIS — M8668 Other chronic osteomyelitis, other site: Secondary | ICD-10-CM | POA: Diagnosis not present

## 2021-03-23 DIAGNOSIS — M8630 Chronic multifocal osteomyelitis, unspecified site: Secondary | ICD-10-CM | POA: Diagnosis not present

## 2021-03-23 DIAGNOSIS — S71002A Unspecified open wound, left hip, initial encounter: Secondary | ICD-10-CM | POA: Insufficient documentation

## 2021-03-23 NOTE — Progress Notes (Signed)
FREDERICH, MONTILLA ([id number]) Visit Report for 03/23/2021 Abuse/Suicide Risk Screen Details Patient Name: Date of Service: Christopher [name] MES B. 03/23/2021 1:15 PM Medical Record Number: [id number] Patient Account Number: [id number] Date of Birth/Sex: Treating RN: November 18, 1965 (55 y.o. Christopher [name], [name] Primary Care Trichelle Lehan: Jacquiline Doe Other Clinician: Referring Muhanad Torosyan: Treating Tyrea Froberg/Extender: Gwendalyn Ege in Treatment: 0 Abuse/Suicide Risk Screen Items Answer ABUSE RISK SCREEN: Has anyone close to you tried to hurt or harm you recentlyo No Do you feel uncomfortable with anyone in your familyo No Has anyone forced you do things that you didnt want to doo No Electronic Signature(s) Signed: 03/23/2021 5:02:53 PM By: Fonnie Mu RN Entered By: Fonnie Mu on 03/23/2021 13:36:55 -------------------------------------------------------------------------------- Activities of Daily Living Details Patient Name: Date of Service: Christopher [name] MES B. 03/23/2021 1:15 PM Medical Record Number: [id number] Patient Account Number: [id number] Date of Birth/Sex: Treating RN: 05-23-1966 (55 y.o. Christopher [name], [name] Primary Care Abdirahman Chittum: Jacquiline Doe Other Clinician: Referring Kiyara Bouffard: Treating Tye Vigo/Extender: Gwendalyn Ege in Treatment: 0 Activities of Daily Living Items Answer Activities of Daily Living (Please select one for each item) Drive Automobile Not Able T Medications ake Need Assistance Use T elephone Need Assistance Care for Appearance Need Assistance Use T oilet Need Assistance Bath / Shower Need Assistance Dress Self Need Assistance Feed Self Need Assistance Walk Need Assistance Get In / Out Bed Need Assistance Housework Need Assistance Prepare Meals Need Assistance Handle Money Need Assistance Shop for Self Need Assistance Electronic Signature(s) Signed: 03/23/2021 5:02:53 PM By:  Fonnie Mu RN Entered By: Fonnie Mu on 03/23/2021 13:37:34 -------------------------------------------------------------------------------- Education Screening Details Patient Name: Date of Service: Christopher [name], Christopher [name] MES B. 03/23/2021 1:15 PM Medical Record Number: [id number] Patient Account Number: [id number] Date of Birth/Sex: Treating RN: 1966-02-20 (55 y.o. Christopher [name], [name] Primary Care Kalil Woessner: Jacquiline Doe Other Clinician: Referring Brandalyn Harting: Treating Geraldy Akridge/Extender: Gwendalyn Ege in Treatment: 0 Primary Learner Assessed: Patient Learning Preferences/Education Level/Primary Language Learning Preference: Explanation, Demonstration, Communication Board, Printed Material Highest Education Level: High School Preferred Language: English Cognitive Barrier Language Barrier: No Translator Needed: No Memory Deficit: No Emotional Barrier: No Cultural/Religious Beliefs Affecting Medical Care: No Physical Barrier Impaired Vision: No Impaired Hearing: No Decreased Hand dexterity: No Knowledge/Comprehension Knowledge Level: High Comprehension Level: High Ability to understand written instructions: High Ability to understand verbal instructions: High Motivation Anxiety Level: Calm Cooperation: Cooperative Education Importance: Denies Need Interest in Health Problems: Asks Questions Perception: Coherent Willingness to Engage in Self-Management High Activities: Readiness to Engage in Self-Management High Activities: Electronic Signature(s) Signed: 03/23/2021 5:02:53 PM By: Fonnie Mu RN Entered By: Fonnie Mu on 03/23/2021 13:37:53 -------------------------------------------------------------------------------- Fall Risk Assessment Details Patient Name: Date of Service: Christopher [name], JA MES B. 03/23/2021 1:15 PM Medical Record Number: [id number] Patient Account Number: [id number] Date of Birth/Sex: Treating  RN: Nov 02, 1965 (55 y.o. Christopher [name], [name] Primary Care Ammar Moffatt: Jacquiline Doe Other Clinician: Referring Madelyn Tlatelpa: Treating Paisly Fingerhut/Extender: Gwendalyn Ege in Treatment: 0 Fall Risk Assessment Items Have you had 2 or more falls in the last 12 monthso 0 No Have you had any fall that resulted in injury in the last 12 monthso 0 No FALLS RISK SCREEN History of falling - immediate or within 3 months 0 No Secondary diagnosis (Do you have 2 or more medical diagnoseso) 0 No Ambulatory aid None/bed rest/wheelchair/nurse 0 No Crutches/cane/walker 0 No Furniture 0 No Intravenous therapy Access/Saline/Heparin Lock 0 No Gait/Transferring Normal/ bed rest/ wheelchair 0 No Weak (  short steps with or without shuffle, stooped but able to lift head while walking, may seek 0 No support from furniture) Impaired (short steps with shuffle, may have difficulty arising from chair, head down, impaired 0 No balance) Mental Status Oriented to own ability 0 No Electronic Signature(s) Signed: 03/23/2021 5:02:53 PM By: Fonnie Mu RN Entered By: Fonnie Mu on 03/23/2021 13:37:59 -------------------------------------------------------------------------------- Foot Assessment Details Patient Name: Date of Service: Christopher [name], Christopher [name] MES B. 03/23/2021 1:15 PM Medical Record Number: [id number] Patient Account Number: [id number] Date of Birth/Sex: Treating RN: 04-18-1966 (55 y.o. Christopher [name], [name] Primary Care Henning Ehle: Jacquiline Doe Other Clinician: Referring Dnya Hickle: Treating Rose-Marie Hickling/Extender: Gwendalyn Ege in Treatment: 0 Foot Assessment Items Site Locations + = Sensation present, - = Sensation absent, C = Callus, U = Ulcer R = Redness, W = Warmth, M = Maceration, PU = Pre-ulcerative lesion F = Fissure, S = Swelling, D = Dryness Assessment Right: Left: Other Deformity: No No Prior Foot Ulcer: No No Prior Amputation: No  No Charcot Joint: No No Ambulatory Status: Gait: Electronic Signature(s) Signed: 03/23/2021 5:02:53 PM By: Fonnie Mu RN Entered By: Fonnie Mu on 03/23/2021 13:38:11 -------------------------------------------------------------------------------- Nutrition Risk Screening Details Patient Name: Date of Service: Christopher [name] MES B. 03/23/2021 1:15 PM Medical Record Number: [id number] Patient Account Number: [id number] Date of Birth/Sex: Treating RN: May 04, 1966 (55 y.o. Christopher [name], [name] Primary Care Zenna Traister: Jacquiline Doe Other Clinician: Referring Kiyomi Pallo: Treating Shauntae Reitman/Extender: Gwendalyn Ege in Treatment: 0 Height (in): 75 Weight (lbs): 160 Body Mass Index (BMI): 20 Nutrition Risk Screening Items Score Screening NUTRITION RISK SCREEN: I have an illness or condition that made me change the kind and/or amount of food I eat 0 No I eat fewer than two meals per day 0 No I eat few fruits and vegetables, or milk products 0 No I have three or more drinks of beer, liquor or wine almost every day 0 No I have tooth or mouth problems that make it hard for me to eat 0 No I don't always have enough money to buy the food I need 0 No I eat alone most of the time 0 No I take three or more different prescribed or over-the-counter drugs a day 0 No Without wanting to, I have lost or gained 10 pounds in the last six months 0 No I am not always physically able to shop, cook and/or feed myself 0 No Nutrition Protocols Good Risk Protocol 0 No interventions needed Moderate Risk Protocol High Risk Proctocol Risk Level: Good Risk Score: 0 Electronic Signature(s) Signed: 03/23/2021 5:02:53 PM By: Fonnie Mu RN Entered By: Fonnie Mu on 03/23/2021 13:38:05

## 2021-03-23 NOTE — Progress Notes (Addendum)
Christopher [name], Christopher [name] ([id number]) Visit Report for 03/23/2021 Allergy List Details Patient Name: Date of Service: Christopher [name] MES B. 03/23/2021 1:15 PM Medical Record Number: [id number] Patient Account Number: [id number] Date of Birth/Sex: Treating RN: July 08, 1965 (55 y.o. Christopher [name] Primary Care Marialuisa Basara: Jacquiline Doe Other Clinician: Referring Jayion Schneck: Treating Calene Paradiso/Extender: Gwendalyn Ege in Treatment: 0 Allergies Active Allergies methodone Type: Medication Allergy Notes Electronic Signature(s) Signed: 03/23/2021 5:02:53 PM By: Fonnie Mu RN Entered By: Fonnie Mu on 03/23/2021 13:32:10 -------------------------------------------------------------------------------- Arrival Information Details Patient Name: Date of Service: Christopher [name], Christopher Kirk MES B. 03/23/2021 1:15 PM Medical Record Number: [id number] Patient Account Number: [id number] Date of Birth/Sex: Treating RN: 05-12-1966 (55 y.o. Christopher [name], [name] Primary Care Ottilie Wigglesworth: Jacquiline Doe Other Clinician: Referring Onda Kattner: Treating Sita Mangen/Extender: Gwendalyn Ege in Treatment: 0 Visit Information Patient Arrived: Wheel Chair Arrival Time: 13:28 Accompanied By: spouse Transfer Assistance: Manual Patient Identification Verified: Yes Secondary Verification Process Completed: Yes Patient Requires Transmission-Based Precautions: No Patient Has Alerts: No Electronic Signature(s) Signed: 03/23/2021 5:02:53 PM By: Fonnie Mu RN Entered By: Fonnie Mu on 03/23/2021 13:28:32 -------------------------------------------------------------------------------- Clinic Level of Care Assessment Details Patient Name: Date of Service: Christopher [name] MES B. 03/23/2021 1:15 PM Medical Record Number: [id number] Patient Account Number: [id number] Date of Birth/Sex: Treating RN: 1966-02-07 (55 y.o. Christopher [name], [name] Primary Care  Albino Bufford: Jacquiline Doe Other Clinician: Referring Kimberely Mccannon: Treating Arley Garant/Extender: Gwendalyn Ege in Treatment: 0 Clinic Level of Care Assessment Items TOOL 4 Quantity Score X- 1 0 Use when only an EandM is performed on FOLLOW-UP visit ASSESSMENTS - Nursing Assessment / Reassessment X- 1 10 Reassessment of Co-morbidities (includes updates in patient status) X- 1 5 Reassessment of Adherence to Treatment Plan ASSESSMENTS - Wound and Skin A ssessment / Reassessment []  - 0 Simple Wound Assessment / Reassessment - one wound X- 1 5 Complex Wound Assessment / Reassessment - multiple wounds []  - 0 Dermatologic / Skin Assessment (not related to wound area) ASSESSMENTS - Focused Assessment []  - 0 Circumferential Edema Measurements - multi extremities []  - 0 Nutritional Assessment / Counseling / Intervention []  - 0 Lower Extremity Assessment (monofilament, tuning fork, pulses) []  - 0 Peripheral Arterial Disease Assessment (using hand held doppler) ASSESSMENTS - Ostomy and/or Continence Assessment and Care []  - 0 Incontinence Assessment and Management []  - 0 Ostomy Care Assessment and Management (repouching, etc.) PROCESS - Coordination of Care []  - 0 Simple Patient / Family Education for ongoing care X- 1 20 Complex (extensive) Patient / Family Education for ongoing care X- 1 10 Staff obtains , Records, T Results / Process Orders est X- 1 10 Staff telephones HHA, Nursing Homes / Clarify orders / etc []  - 0 Routine Transfer to another Facility (non-emergent condition) []  - 0 Routine Hospital Admission (non-emergent condition) []  - 0 New Admissions / / Ordering NPWT Apligraf, etc. , []  - 0 Emergency Hospital Admission (emergent condition) []  - 0 Simple Discharge Coordination X- 1 15 Complex (extensive) Discharge Coordination PROCESS - Special Needs []  - 0 Pediatric / Minor Patient Management []  -  0 Isolation Patient Management []  - 0 Hearing / Language / Visual special needs []  - 0 Assessment of Community assistance (transportation, D/C planning, etc.) []  - 0 Additional assistance / Altered mentation []  - 0 Support Surface(s) Assessment (bed, cushion, seat, etc.) INTERVENTIONS - Wound Cleansing / Measurement []  - 0 Simple Wound Cleansing - one wound X- 1 5 Complex Wound Cleansing - multiple wounds  X- 1 5 Wound Imaging (photographs - any number of wounds) []  - 0 Wound Tracing (instead of photographs) X- 1 5 Simple Wound Measurement - one wound []  - 0 Complex Wound Measurement - multiple wounds INTERVENTIONS - Wound Dressings X - Small Wound Dressing one or multiple wounds 1 10 []  - 0 Medium Wound Dressing one or multiple wounds []  - 0 Large Wound Dressing one or multiple wounds X- 1 5 Application of Medications - topical []  - 0 Application of Medications - injection INTERVENTIONS - Miscellaneous []  - 0 External ear exam []  - 0 Specimen Collection (cultures, biopsies, blood, body fluids, etc.) []  - 0 Specimen(s) / Culture(s) sent or taken to Lab for analysis []  - 0 Patient Transfer (multiple staff / / Similar devices) []  - 0 Simple Staple / Suture removal (25 or less) []  - 0 Complex Staple / Suture removal (26 or more) []  - 0 Hypo / Hyperglycemic Management (close monitor of Blood Glucose) []  - 0 Ankle / Brachial Index (ABI) - do not check if billed separately X- 1 5 Vital Signs Has the patient been seen at the hospital within the last three years: Yes Total Score: 110 Level Of Care: New/Established - Level 3 Electronic Signature(s) Signed: 03/23/2021 5:02:53 PM By: RN Entered By: on 03/23/2021 15:12:22 -------------------------------------------------------------------------------- Encounter Discharge Information Details Patient Name: Date of Service: , MES B. 03/23/2021 1:15 PM Medical  Record Number: Patient Account Number: Nurse, adult Date of Birth/Sex: Treating RN: 26-Apr-1966 (55 y.o. , [name] Primary Care Idriss Quackenbush: Other Clinician: Referring Allaina Brotzman: Treating Rayssa Atha/Extender: 05/23/2021 in Treatment: 0 Encounter Discharge Information Items Discharge Condition: Stable Ambulatory Status: Wheelchair Discharge Destination: Home Transportation: Private Auto Accompanied By: self Schedule Follow-up Appointment: Yes Clinical Summary of Care: Patient Declined Electronic Signature(s) Signed: 03/23/2021 5:02:53 PM By: Fonnie Mu RN Entered By: 05/23/2021 on 03/23/2021 15:13:50 -------------------------------------------------------------------------------- Lower Extremity Assessment Details Patient Name: Date of Service: Christopher Kirk MES B. 03/23/2021 1:15 PM Medical Record Number: [id number] Patient Account Number: [id number] Date of Birth/Sex: Treating RN: [birth year] (55 y.o. Christopher [name] Primary Care Shakiyah Cirilo: Jacquiline Doe Other Clinician: Referring Rosaland Shiffman: Treating Bennie Scaff/Extender: Gwendalyn Ege in Treatment: 0 Electronic Signature(s) Signed: 03/23/2021 5:02:53 PM By: Fonnie Mu RN Entered By: Fonnie Mu on 03/23/2021 13:38:17 -------------------------------------------------------------------------------- Multi Wound Chart Details Patient Name: Date of Service: Christopher [name], 05/23/2021 MES B. 03/23/2021 1:15 PM Medical Record Number: [id number] Patient Account Number: 09/29/1965 Date of Birth/Sex: Treating RN: 03-25-1966 (55 y.o. Jacquiline Doe Primary Care Tyonna Talerico: Gwendalyn Ege Other Clinician: Referring Kamaal Cast: Treating Charli Liberatore/Extender: 05/23/2021 in Treatment: 0 Vital Signs Height(in): 75 Pulse(bpm): 74 Weight(lbs): 160 Blood Pressure(mmHg): 137/74 Body Mass Index(BMI):  20 Temperature(F): 98.7 Respiratory Rate(breaths/min): 17 Photos: [N/A:N/A] Left Trochanter N/A N/A Wound Location: Trauma N/A N/A Wounding Event: Trauma, Other N/A N/A Primary Etiology: Hypertension, Osteomyelitis N/A N/A Comorbid History: 07/10/2018 N/A N/A Date Acquired: 0 N/A N/A Weeks of Treatment: Open N/A N/A Wound Status: 0.4x1.5x4 N/A N/A Measurements L x W x D (cm) 0.471 N/A N/A A (cm) : rea 1.885 N/A N/A Volume (cm) : 0.00% N/A N/A % Reduction in A rea: 0.00% N/A N/A % Reduction in Volume: 12 Starting Position 1 (o'clock): 12 Ending Position 1 (o'clock): 3.4 Maximum Distance 1 (cm): Yes N/A N/A Undermining: Full Thickness Without Exposed N/A N/A Classification: Support Structures Medium N/A N/A Exudate Amount: Serosanguineous N/A N/A Exudate Type: red,  brown N/A N/A Exudate Color: Distinct, outline attached N/A N/A Wound Margin: Large (67-100%) N/A N/A Granulation Amount: Red, Pink N/A N/A Granulation Quality: None Present (0%) N/A N/A Necrotic Amount: Fascia: No N/A N/A Exposed Structures: Fat Layer (Subcutaneous Tissue): No Tendon: No Muscle: No Joint: No Bone: No None N/A N/A Epithelialization: Treatment Notes Wound #1 (Trochanter) Wound Laterality: Left Cleanser Wound Cleanser Discharge Instruction: Cleanse the wound with wound cleanser prior to applying a clean dressing using gauze sponges, not tissue or cotton balls. Peri-Wound Care Skin Prep Discharge Instruction: Use skin prep as directed Topical Primary Dressing Iodoform packing strip 1/2 (in) Discharge Instruction: Lightly pack as instructed Secondary Dressing Woven Gauze Sponge, Non-Sterile 4x4 in Discharge Instruction: Apply over primary dressing as directed. Zetuvit Plus Silicone Border Dressing 4x4 (in/in) Discharge Instruction: Apply silicone border over primary dressing as directed. Secured With Compression Wrap Compression Stockings Geologist, engineering) Signed: 03/24/2021 1:20:25 PM By: Geralyn Corwin DO Signed: 03/24/2021 6:03:07 PM By: Shawn Stall RN, BSN Entered By: Geralyn Corwin on 03/24/2021 12:32:21 -------------------------------------------------------------------------------- Multi-Disciplinary Care Plan Details Patient Name: Date of Service: Christopher [name], Christopher Kirk MES B. 03/23/2021 1:15 PM Medical Record Number: [id number] Patient Account Number: [id number] Date of Birth/Sex: Treating RN: [birth year] (54 y.o. Christopher [name], [name] Primary Care Juanette Urizar: Jacquiline Doe Other Clinician: Referring Keniyah Gelinas: Treating Sherene Plancarte/Extender: Gwendalyn Ege in Treatment: 0 Active Inactive Orientation to the Wound Care Program Nursing Diagnoses: Knowledge deficit related to the wound healing center program Goals: Patient/caregiver will verbalize understanding of the Wound Healing Center Program Date Initiated: 03/23/2021 Target Resolution Date: 04/15/2021 Goal Status: Active Interventions: Provide education on orientation to the wound center Notes: Wound/Skin Impairment Nursing Diagnoses: Impaired tissue integrity Knowledge deficit related to ulceration/compromised skin integrity Goals: Patient will have a decrease in wound volume by X% from date: (specify in notes) Date Initiated: 03/23/2021 Target Resolution Date: 04/16/2021 Goal Status: Active Patient/caregiver will verbalize understanding of skin care regimen Date Initiated: 03/23/2021 Target Resolution Date: 04/16/2021 Goal Status: Active Ulcer/skin breakdown will have a volume reduction of 30% by week 4 Date Initiated: 03/23/2021 Target Resolution Date: 04/16/2021 Goal Status: Active Interventions: Assess patient/caregiver ability to obtain necessary supplies Assess patient/caregiver ability to perform ulcer/skin care regimen upon admission and as needed Assess ulceration(s) every visit Notes: Electronic Signature(s) Signed:  03/23/2021 5:02:53 PM By: Fonnie Mu RN Entered By: Fonnie Mu on 03/23/2021 14:32:04 -------------------------------------------------------------------------------- Pain Assessment Details Patient Name: Date of Service: Christopher [name] MES B. 03/23/2021 1:15 PM Medical Record Number: [id number] Patient Account Number: [id number] Date of Birth/Sex: Treating RN: 11/07/1965 (55 y.o. Christopher [name], [name] Primary Care Nathalya Wolanski: Jacquiline Doe Other Clinician: Referring Temple Sporer: Treating Robbi Spells/Extender: Gwendalyn Ege in Treatment: 0 Active Problems Location of Pain Severity and Description of Pain Patient Has Paino No Site Locations Pain Management and Medication Current Pain Management: Electronic Signature(s) Signed: 03/23/2021 5:02:53 PM By: Fonnie Mu RN Entered By: Fonnie Mu on 03/23/2021 13:38:29 -------------------------------------------------------------------------------- Patient/Caregiver Education Details Patient Name: Date of Service: Christopher [name] MES B. 10/11/2022andnbsp1:15 PM Medical Record Number: [id number] Patient Account Number: [id number] Date of Birth/Gender: Treating RN: 04/19/1966 (55 y.o. Christopher [name] Primary Care Physician: Jacquiline Doe Other Clinician: Referring Physician: Treating Physician/Extender: Gwendalyn Ege in Treatment: 0 Education Assessment Education Provided To: Patient Education Topics Provided Welcome T [facility]: o Methods: Explain/Verbal Responses: Reinforcements needed, State content correctly Electronic Signature(s) Signed: 03/23/2021 5:02:53 PM By: Fonnie Mu RN Entered By: Fonnie Mu on 03/23/2021 14:32:25 --------------------------------------------------------------------------------  Wound Assessment Details Patient Name: Date of Service: Christopher [name] MES B. 03/23/2021 1:15 PM Medical Record  Number: [id number] Patient Account Number: [id number] Date of Birth/Sex: Treating RN: [birth year] (55 y.o. Christopher [name], [name] Primary Care Jillian Pianka: Jacquiline Doe Other Clinician: Referring Mechel Schutter: Treating Felicie Kocher/Extender: Gwendalyn Ege in Treatment: 0 Wound Status Wound Number: 1 Primary Etiology: Trauma, Other Wound Location: Left Trochanter Wound Status: Open Wounding Event: Trauma Comorbid History: Hypertension, Osteomyelitis Date Acquired: 07/10/2018 Weeks Of Treatment: 0 Clustered Wound: No Photos Wound Measurements Length: (cm) 0.4 Width: (cm) 1.5 Depth: (cm) 4 Area: (cm) 0.471 Volume: (cm) 1.885 % Reduction in Area: 0% % Reduction in Volume: 0% Epithelialization: None Tunneling: No Undermining: Yes Starting Position (o'clock): 12 Ending Position (o'clock): 12 Maximum Distance: (cm) 3.4 Wound Description Classification: Full Thickness Without Exposed Support Structures Wound Margin: Distinct, outline attached Exudate Amount: Medium Exudate Type: Serosanguineous Exudate Color: red, brown Foul Odor After Cleansing: No Slough/Fibrino No Wound Bed Granulation Amount: Large (67-100%) Exposed Structure Granulation Quality: Red, Pink Fascia Exposed: No Necrotic Amount: None Present (0%) Fat Layer (Subcutaneous Tissue) Exposed: No Tendon Exposed: No Muscle Exposed: No Joint Exposed: No Bone Exposed: No Treatment Notes Wound #1 (Trochanter) Wound Laterality: Left Cleanser Wound Cleanser Discharge Instruction: Cleanse the wound with wound cleanser prior to applying a clean dressing using gauze sponges, not tissue or cotton balls. Peri-Wound Care Skin Prep Discharge Instruction: Use skin prep as directed Topical Primary Dressing Iodoform packing strip 1/2 (in) Discharge Instruction: Lightly pack as instructed Secondary Dressing Woven Gauze Sponge, Non-Sterile 4x4 in Discharge Instruction: Apply over primary dressing as  directed. Zetuvit Plus Silicone Border Dressing 4x4 (in/in) Discharge Instruction: Apply silicone border over primary dressing as directed. Secured With Compression Wrap Compression Stockings Facilities manager) Signed: 03/23/2021 5:02:53 PM By: Fonnie Mu RN Entered By: Fonnie Mu on 03/23/2021 13:56:55 -------------------------------------------------------------------------------- Vitals Details Patient Name: Date of Service: Christopher [name], Christopher Kirk MES B. 03/23/2021 1:15 PM Medical Record Number: [id number] Patient Account Number: [id number] Date of Birth/Sex: Treating RN: Aug 21, 1965 (55 y.o. Christopher [name], [name] Primary Care Ednah Hammock: Jacquiline Doe Other Clinician: Referring Aris Moman: Treating Lexianna Weinrich/Extender: Gwendalyn Ege in Treatment: 0 Vital Signs Time Taken: 13:28 Temperature (F): 98.7 Height (in): 75 Pulse (bpm): 74 Source: Stated Respiratory Rate (breaths/min): 17 Weight (lbs): 160 Blood Pressure (mmHg): 137/74 Source: Stated Reference Range: 80 - 120 mg / dl Body Mass Index (BMI): 20 Electronic Signature(s) Signed: 03/23/2021 5:02:53 PM By: Fonnie Mu RN Entered By: Fonnie Mu on 03/23/2021 13:29:03
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
## 2021-03-29 ENCOUNTER — Other Ambulatory Visit: Payer: Self-pay

## 2021-03-29 ENCOUNTER — Encounter
Payer: No Typology Code available for payment source | Attending: Physical Medicine and Rehabilitation | Admitting: Physical Medicine and Rehabilitation

## 2021-03-29 ENCOUNTER — Encounter: Payer: Self-pay | Admitting: Physical Medicine and Rehabilitation

## 2021-03-29 VITALS — BP 125/84 | HR 106 | Temp 98.1°F | Ht 76.0 in

## 2021-03-29 DIAGNOSIS — Z993 Dependence on wheelchair: Secondary | ICD-10-CM

## 2021-03-29 DIAGNOSIS — Z5181 Encounter for therapeutic drug level monitoring: Secondary | ICD-10-CM

## 2021-03-29 DIAGNOSIS — G5621 Lesion of ulnar nerve, right upper limb: Secondary | ICD-10-CM | POA: Diagnosis present

## 2021-03-29 DIAGNOSIS — M8668 Other chronic osteomyelitis, other site: Secondary | ICD-10-CM | POA: Diagnosis present

## 2021-03-29 DIAGNOSIS — Z933 Colostomy status: Secondary | ICD-10-CM | POA: Diagnosis present

## 2021-03-29 DIAGNOSIS — G894 Chronic pain syndrome: Secondary | ICD-10-CM

## 2021-03-29 DIAGNOSIS — E43 Unspecified severe protein-calorie malnutrition: Secondary | ICD-10-CM | POA: Diagnosis present

## 2021-03-29 DIAGNOSIS — Z79891 Long term (current) use of opiate analgesic: Secondary | ICD-10-CM | POA: Diagnosis present

## 2021-03-29 MED ORDER — HYDROMORPHONE HCL 4 MG PO TABS: ORAL_TABLET | ORAL | 0 refills | Status: DC

## 2021-03-29 NOTE — Patient Instructions (Signed)
  Will keep off Buspar since hasn't had any problems, worsening of Symptoms since ran out .One less pill!  2. We need to treat the person we love most, the BEST, not the worst!   3.  Suggest making Klonopin AS NEEDED-    4. Worker's Comp working on Psychiatry referral still. Still need to see- in spite of things being a little more calm.   5. Refilled his Fentanyl patch 10/5, but is due to Dilaudid #260- max-is due, but does have some left at home.    6. 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   Nerve is superficial in elbow- need to wear tennis elbow splint sin general.   7. F/U in 3 months needs to be scheduled and keep one in 6 weeks.

## 2021-03-29 NOTE — Progress Notes (Signed)
Subjective:    Patient ID: Christopher [name], male    DOB: December 01, 1965, 55 y.o.   MRN: [id number]  HPI  Pt is a 55 yr old male with hx of Multitrauma- causing L femoral neck fx, degloving of L hip to going/scrotum, bladder neck trauma- got SPC, developed compartment syndrome -s/p surgery for that; also diverting colostomy, skin grafts, and s/p trach and PEG- PEG is out. 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 S/P screw removal and on IV ABX for L hip osteomyelitis. 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     Is tired today.   Still confused- more than a little- but not severe.  Seroquel has helped the   Hasn't had the Buspar- ~ 1 week ago.  Was wondering if wants to continue- hasn't noticed any difference off it.    Started PT/OT and SLP Has gotten up a couple of times with PT- they've come 4-5 weeks- 1x/week- OT and 1x/day and SLP 2x/week.   Eating-thing- didn't always get the full 5 bottles/day- eating pretty well on his own- Sharee Pimple based on what he's eaten for the day.  Has gained weight.   Episode of coughing really bad this AM- didn't want to come- wore him out.   If cuts back on meds- notice his pain is much worse.  Has tried cutting Dilaudid dose down Confusion about the same if reduces dose or not- confusion the same, no matter what dose of pain meds.  Is taking dilaudid 5 pills/day or so.    Not as angry- not accusing her of doing things anymore. But still confused- sentence doesn't make sense- and things that don't go together. And gets irritable real easy.  Not as mean to wife as he was for awhile.  Seroquel- 25 mg in Am and 75 mg nightly.   Taking Klonopin at  8am, 2pm and night time- 1/2 pill-  Is about out of the bottle.  Klonopin - a couple of times insurance was slow and didn't have for a dose or 2.    Having muscle jerking/tremors- falling asleep and jerks- catch self per wife.   Falling asleep in front of TV, so sleeping more in front of TV/during day.  Restless at night.  Started ~ 1 week ago.   Started Abilify late August- not crying as much and getting worked up as much; but Seroquel really made the difference with the anger issues.   Bowels working "too good" with TF's.   Pain Inventory Average Pain 9 Pain Right Now 9 My pain is intermittent, sharp, burning, dull, stabbing, tingling, and aching  LOCATION OF PAIN  Groin, hip, thigh, knee, leg (mostly the left-side)  BOWEL Number of stools per week: Colostomy  Oral laxative use Yes   History of colostomy Yes    BLADDER Suprapubic  In & Out as needed  Mobility walk with assistance use a walker ability to climb steps?  no do you drive?  no use a wheelchair needs help with transfers Do you have any goals in this area?  yes  Function disabled: date disabled 2020  Neuro/Psych bladder control problems bowel control problems weakness numbness tingling trouble walking  Prior Studies Any changes since last visit?  no  Physicians involved in your care Any changes since last visit?  no   Family History  Problem Relation Age of Onset   Breast cancer Mother        with mets to the bones   Social History   Socioeconomic History   Marital status: Married    Spouse name: Not on file   Number of children: Not on file   Years of education: Not on file   Highest education level: Not on file  Occupational History   Occupation: Disable  Tobacco Use   Smoking status: Former    Packs/day: 1.00    Years: 20.00    Pack years: 20.00    Types: Cigarettes    Quit date: 07/10/2018    Years since quitting: 2.7   Smokeless tobacco: Never  Vaping Use   Vaping Use:  Never used  Substance and Sexual Activity   Alcohol use: Never   Drug use: Yes    Types: Oxycodone, Fentanyl    Comment: Fentanyl patch/oxycodone since 06/2018   Sexual activity: Yes  Other Topics Concern   Not on file  Social History Narrative   ** Merged History Encounter **       Social Determinants of Health   Financial Resource Strain: Not on file  Food Insecurity: Not on file  Transportation Needs: Not on file  Physical Activity: Not on file  Stress: Not on file  Social Connections: Not on file   Past Surgical History:  Procedure Laterality Date   APPLICATION OF A-CELL OF BACK N/A 08/06/2018   Procedure: Application Of A-Cell Of Back;  Surgeon: Peggye Form, DO;  Location: [facility] OR;  Service: Plastics;  Laterality: N/A;   APPLICATION OF A-CELL OF EXTREMITY Left 08/06/2018   Procedure: Application Of A-Cell Of Extremity;  Surgeon: Peggye Form, DO;  Location: [facility];  Service: Plastics;  Laterality: Left;   APPLICATION OF A-CELL OF EXTREMITY Left 09/18/2019   Procedure: APPLICATION OF A-CELL OF EXTREMITY;  Surgeon: Peggye Form, DO;  Location: [facility];  Service: Plastics;  Laterality: Left;   APPLICATION OF WOUND VAC  07/12/2018   Procedure: Application Of Wound Vac to the Left Thigh and Scrotum.;  Surgeon: Roby Lofts, MD;  Location: [facility] OR;  Service: Orthopedics;;   APPLICATION OF WOUND VAC  07/10/2018   Procedure: Application Of Wound Vac;  Surgeon: Berna Bue, MD;  Location: [facility] OR;  Service: General;;   COLON SURGERY  2020   colostomy   COLOSTOMY N/A 07/23/2018   Procedure: COLOSTOMY;  Surgeon: Violeta Gelinas, MD;  Location: [facility] OR;  Service: General;  Laterality: N/A;   CYSTOSCOPY W/ URETERAL STENT PLACEMENT N/A 07/15/2018   Procedure: RETROGRADE URETHROGRAM;  Surgeon: Marcine Matar, MD;  Location: [facility] OR;  Service: Urology;  Laterality: N/A;   CYSTOSCOPY WITH LITHOLAPAXY N/A 05/06/2019   Procedure: CYSTOSCOPY BASKET BLADDER STONE EXTRACTION;   Surgeon: Malen Gauze, MD;  Location: [facility];  Service: Urology;  Laterality: N/A;  30 MINS   CYSTOSTOMY N/A 05/06/2019   Procedure: REPLACEMENT OF SUPRAPUBIC CATHETER;  Surgeon: Malen Gauze, MD;  Location: [facility];  Service: Urology;  Laterality: N/A;   DEBRIDEMENT AND CLOSURE WOUND Left 03/04/2019   Procedure: Excision of hip wound with placement of Acell;  Surgeon: Peggye Form, DO;  Location: [facility];  Service: Plastics;  Laterality: Left;   ESOPHAGOGASTRODUODENOSCOPY N/A 08/14/2018  Procedure: ESOPHAGOGASTRODUODENOSCOPY (EGD);  Surgeon: Violeta Gelinas, MD;  Location: [facility] ENDOSCOPY;  Service: General;  Laterality: N/A;  bedside   FACIAL RECONSTRUCTION SURGERY     X 2--once as a teenager and second time in his 39's   HARDWARE REMOVAL Left 03/04/2019   Procedure: Left Hip Hardware Removal;  Surgeon: Roby Lofts, MD;  Location: [facility];  Service: Orthopedics;  Laterality: Left;   HIP PINNING,CANNULATED Left 07/12/2018   Procedure: CANNULATED HIP PINNING;  Surgeon: Roby Lofts, MD;  Location: [facility];  Service: Orthopedics;  Laterality: Left;   HIP SURGERY     HOLMIUM LASER APPLICATION Right 07/15/2019   Procedure: HOLMIUM LASER APPLICATION;  Surgeon: Malen Gauze, MD;  Location: [facility];  Service: Urology;  Laterality: Right;   I & D EXTREMITY Left 07/25/2018   Procedure: Debridement of buttock, scrotum and left leg, placement of acell and vac;  Surgeon: Peggye Form, DO;  Location: [facility];  Service: Plastics;  Laterality: Left;   I & D EXTREMITY N/A 08/06/2018   Procedure: Debridement of buttock, scrotum and left leg;  Surgeon: Peggye Form, DO;  [facility]: [facility];  Service: Plastics;  Laterality: N/A;   I & D EXTREMITY N/A 08/13/2018   Procedure: Debridement of buttock, scrotum and left leg, placement of acell and vac;  Surgeon: Peggye Form, DO;  Location: [facility];  Service: Plastics;   Laterality: N/A;  90 min, please   INCISION AND DRAINAGE HIP Left 09/18/2019   Procedure: IRRIGATION AND DEBRIDEMENT HIP/ PELVIS WITH WOUND VAC PLACEMENT;  Surgeon: Roby Lofts, MD;  Location: [facility];  Service: Orthopedics;  Laterality: Left;   INCISION AND DRAINAGE OF WOUND N/A 07/18/2018   Procedure: Debridement of left leg, buttocks and scrotal wound with placement of acell and Flexiseal;  Surgeon: Peggye Form, DO;  Location: [facility];  Service: Plastics;  Laterality: N/A;   INCISION AND DRAINAGE OF WOUND Left 08/29/2018   Procedure: Debridement of buttock, scrotum and left leg, placement of acell and vac;  Surgeon: Peggye Form, DO;  Location: [facility];  Service: Plastics;  Laterality: Left;  75 min, please   INCISION AND DRAINAGE OF WOUND Bilateral 10/23/2018   Procedure: DEBRIDEMENT OF BUTTOCK,SCROTUM, AND LEG WOUNDS WITH PLACEMENT OF ACELL- BILATERAL 90 MIN;  Surgeon: Peggye Form, DO;  Location: [facility];  Service: Plastics;  Laterality: Bilateral;   IR ANGIOGRAM PELVIS SELECTIVE OR SUPRASELECTIVE  07/10/2018   IR ANGIOGRAM PELVIS SELECTIVE OR SUPRASELECTIVE  07/10/2018   IR ANGIOGRAM SELECTIVE EACH ADDITIONAL VESSEL  07/10/2018   IR EMBO ART  VEN HEMORR LYMPH EXTRAV  INC GUIDE ROADMAPPING  07/10/2018   IR GASTROSTOMY TUBE MOD SED  01/21/2021   IR NEPHROSTOMY PLACEMENT LEFT  04/05/2019   IR NEPHROSTOMY PLACEMENT RIGHT  05/31/2019   IR US GUIDE BX ASP/DRAIN  07/10/2018   IR US GUIDE VASC ACCESS RIGHT  07/10/2018   IR VENO/EXT/UNI LEFT  07/10/2018   IRRIGATION AND DEBRIDEMENT OF WOUND WITH SPLIT THICKNESS SKIN GRAFT Left 09/19/2018   Procedure: Debridement of gluteal wound with placement of acell to left leg.;  Surgeon: Peggye Form, DO;  Location: [facility];  Service: Plastics;  Laterality: Left;  2.5 hours, please   LAPAROTOMY N/A 07/12/2018   Procedure: EXPLORATORY LAPAROTOMY;  Surgeon: Violeta Gelinas, MD;  Location: [facility] OR;  Service: General;  Laterality: N/A;   LAPAROTOMY N/A  07/15/2018   Procedure: WOUND EXPLORATION; CLOSURE OF ABDOMEN;  Surgeon:  Violeta Gelinas, MD;  Location: [facility] OR;  Service: General;  Laterality: N/A;   LAPAROTOMY  07/10/2018   Procedure: Exploratory Laparotomy;  Surgeon: Berna Bue, MD;  Location[facility] OR;  Service: General;;   MASS EXCISION Left 09/18/2019   Procedure: EXCISION UPPER LEFT INNER THIGH WOUND;  Surgeon: Peggye Form, DO;  [facility];  Service: Plastics;  Laterality: Left;   NEPHROLITHOTOMY Right 07/15/2019   Procedure: NEPHROLITHOTOMY PERCUTANEOUS;  Surgeon: Malen Gauze, MD;  Location: [facility];  Service: Urology;  Laterality: Right;  90 MINS   PEG PLACEMENT N/A 08/14/2018   Procedure: PERCUTANEOUS ENDOSCOPIC GASTROSTOMY (PEG) PLACEMENT;  Surgeon: Violeta Gelinas, MD;  Location: [facility] ENDOSCOPY;  Service: General;  Laterality: N/A;   PERCUTANEOUS TRACHEOSTOMY N/A 08/02/2018   Procedure: PERCUTANEOUS TRACHEOSTOMY;  Surgeon: Violeta Gelinas, MD;  Location: [facility] OR;  Service: General;  Laterality: N/A;   RADIOLOGY WITH ANESTHESIA N/A 07/10/2018   Procedure: IR WITH ANESTHESIA;  Surgeon: Simonne Come, MD;  Location: [facility] OR;  Service: Radiology;  Laterality: N/A;   RADIOLOGY WITH ANESTHESIA Right 07/10/2018   Procedure: Ir With Anesthesia;  Surgeon: Simonne Come, MD;  Location: [facility] OR;  Service: Radiology;  Laterality: Right;   SCROTAL EXPLORATION N/A 07/15/2018   Procedure: SCROTUM DEBRIDEMENT;  Surgeon: Marcine Matar, MD;  Location: [facility] OR;  Service: Urology;  Laterality: N/A;   SHOULDER SURGERY     SKIN SPLIT GRAFT Right 09/19/2018   Procedure: Skin Graft Split Thickness;  Surgeon: Peggye Form, DO;  Location: [facility];  Service: Plastics;  Laterality: Right;   SKIN SPLIT GRAFT N/A 10/03/2018   Procedure: Split thickness skin graft to gluteal area with acell placement;  Surgeon: Peggye Form, DO;  Location: [facility];  Service: Plastics;  Laterality: N/A;  3 hours, please   VACUUM ASSISTED CLOSURE  CHANGE N/A 07/12/2018   Procedure: ABDOMINAL VACUUM ASSISTED CLOSURE CHANGE and abdominal washout;  Surgeon: Violeta Gelinas, MD;  Location: [facility] OR;  Service: General;  Laterality: N/A;   WOUND DEBRIDEMENT Left 07/23/2018   Procedure: DEBRIDEMENT LEFT BUTTOCK  WOUND;  Surgeon: Violeta Gelinas, MD;  Location: [facility] OR;  Service: General;  Laterality: Left;   WOUND EXPLORATION Left 07/10/2018   Procedure: WOUND EXPLORATION LEFT GROIN;  Surgeon: Berna Bue, MD;  Location: [facility];  Service: General;  Laterality: Left;   Past Medical History:  Diagnosis Date   Acute on chronic respiratory failure with hypoxia (HCC) 06/2018   trach removed 11-16-2018, on vent from jan until may 2020 - uses albuterol prn   Anxiety    Bacteremia due to Pseudomonas 06/2018   Chronic osteomyelitis (HCC)    Chronic pain syndrome    Clostridium difficile colitis 10/30/2019   tx with abx    Depression    DVT (deep venous thrombosis) (HCC) 2020   right brachial post PICC line   History of blood transfusion 06/2018   History of Clostridioides difficile colitis    History of kidney stones    Hypertension    norvasc d/c by pcp on 11/05/19   Multiple traumatic injuries    Penile pain 11/18/2019   Pneumonia 11/2009   2020 x 2   Walker as ambulation aid    Wheelchair bound    electric   Wound discharge    left hip wound with bloody/clear drainage change dressing q day surgilube with gauze, between legs wound using calcium algenate pad bid   BP 125/84   Pulse (!) 106  Temp 98.1 F (36.7 C)   Ht 6\' 4"  (1.93 m)   SpO2 92% Comment: 3 liters of o2  BMI 18.87 kg/m   Opioid Risk Score:   Fall Risk Score:  `1  Depression screen PHQ 2/9  Depression screen [facility] 2/9 02/10/2021 01/01/2021 09/02/2020 07/02/2020 03/17/2020 12/30/2019 12/18/2019  Decreased Interest 3 3 1 3  0 0 0  Down, Depressed, Hopeless 3 3 1 2 1  0 0  PHQ - 2 Score 6 6 2 5 1  0 0  Altered sleeping - - - 3 - - -  Tired, decreased energy - - - 3 - - -  Change  in appetite - - - 3 - - -  Feeling bad or failure about yourself  - 3 - 2 - - -  Trouble concentrating - - - 3 - - -  Moving slowly or fidgety/restless - - - 3 - - -  Suicidal thoughts - 0 - 0 - - -  PHQ-9 Score - - - 22 - - -  Difficult doing work/chores - Extremely dIfficult - - - - -  Some recent data might be hidden    Review of Systems  Musculoskeletal:  Positive for arthralgias, back pain and gait problem.  Neurological:  Positive for tremors and numbness.       Tingling  Psychiatric/Behavioral:  Positive for confusion.   All other systems reviewed and are negative.     Objective:   Physical Exam  Awake, alert, but sleepy; in power w/c; accompanied by wife; leaning to R to get off L hip, NAD Wearing O2- Colostomy in place and PEG (+) Eyes at half mast- sleepy Has gained weight- abd is more full/protuberant- not distended MS:  RUE biceps 5/5; Triceps 5/5, WE 3+/5; grip 4-/5, and Finger abd 2-/5 LUE 5/5 prominent atrophy of just proximal to 1st dorsal interossei on R hand on dorsum and palmar surface- esp compared to L hand; also has some mild atrophy ofulnar aspect of R hand. Is right handed Cannot quite fully extend R 5th digit at PIP- lacking ~ 5-10 degrees.  Neuro:  Numbness in 5th digit ONLY- not any in 3rd/4th digit at all No numbness in 1st-4th digits on R hand But has (+) tinel's on R at wrist- not at elbow Also had a tremor episode- looked at Hoffman's- was (-) however appeared to have some tone in hands B/L  No increased tone At end of visit- started fine tremors of LUE.     Assessment & Plan:   Pt is a 55 yr old male with hx of Multitrauma- causing L femoral neck fx, degloving of L hip to going/scrotum, bladder neck trauma- got SPC, developed compartment syndrome -s/p surgery for that; also diverting colostomy, skin grafts, and s/p trach and PEG- PEG is out. 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  S/P screw removal and on IV ABX for L hip osteomyelitis. 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     Will keep off Buspar since hasn't had any problems, worsening of Symptoms since ran out .One less pill!  2. We need to treat the person we love most, the BEST, not the worst!  3.  Suggest making Klonopin AS NEEDED-    4. Worker's Comp working on Psychiatry referral still. Still need to see- in spite of things being a little more calm.   5. Refilled his Fentanyl patch 10/5, but is due to Dilaudid #260- max-is due, but does have some left at home.    6. 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   Nerve is superficial in elbow- need to wear tennis elbow splint sin general.   7. F/U in 3 months needs to be scheduled and keep one in 6 weeks.  Do ASIA exam for ? SCI? Moves like one per PT.    I spent a total of 41 minutes on visit- with worker's comp and discussing EMG/NCS with pt.

## 2021-04-03 LAB — DRUG TOX MONITOR 1 W/CONF, ORAL FLD
Alprazolam: NEGATIVE ng/mL (ref <0.50)
Amphetamines: NEGATIVE ng/mL (ref <10)
Barbiturates: NEGATIVE ng/mL (ref <10)
Benzodiazepines: POSITIVE ng/mL — AB (ref <0.50)
Buprenorphine: NEGATIVE ng/mL (ref <0.10)
Chlordiazepoxide: NEGATIVE ng/mL (ref <0.50)
Clonazepam: 0.58 ng/mL — ABNORMAL HIGH (ref <0.50)
Cocaine: NEGATIVE ng/mL (ref <5.0)
Codeine: NEGATIVE ng/mL (ref <2.5)
Diazepam: NEGATIVE ng/mL (ref <0.50)
Dihydrocodeine: NEGATIVE ng/mL (ref <2.5)
EDDP: NEGATIVE ng/mL (ref <5.0)
Fentanyl: 13.24 ng/mL — ABNORMAL HIGH (ref <0.10)
Fentanyl: POSITIVE ng/mL — AB (ref <0.10)
Flunitrazepam: NEGATIVE ng/mL (ref <0.50)
Flurazepam: NEGATIVE ng/mL (ref <0.50)
Heroin Metabolite: NEGATIVE ng/mL (ref <1.0)
Hydrocodone: NEGATIVE ng/mL (ref <2.5)
Hydromorphone: 62.5 ng/mL — ABNORMAL HIGH (ref <2.5)
Lorazepam: NEGATIVE ng/mL (ref <0.50)
MARIJUANA: NEGATIVE ng/mL (ref <2.5)
MDMA: NEGATIVE ng/mL (ref <10)
Meprobamate: NEGATIVE ng/mL (ref <2.5)
Methadone: 6.7 ng/mL — ABNORMAL HIGH (ref <5.0)
Methadone: POSITIVE ng/mL — AB (ref <5.0)
Midazolam: NEGATIVE ng/mL (ref <0.50)
Morphine: NEGATIVE ng/mL (ref <2.5)
Nicotine Metabolite: NEGATIVE ng/mL (ref <5.0)
Nordiazepam: NEGATIVE ng/mL (ref <0.50)
Norhydrocodone: NEGATIVE ng/mL (ref <2.5)
Noroxycodone: NEGATIVE ng/mL (ref <2.5)
Opiates: POSITIVE ng/mL — AB (ref <2.5)
Oxazepam: NEGATIVE ng/mL (ref <0.50)
Oxycodone: NEGATIVE ng/mL (ref <2.5)
Oxymorphone: NEGATIVE ng/mL (ref <2.5)
Phencyclidine: NEGATIVE ng/mL (ref <10)
Tapentadol: NEGATIVE ng/mL (ref <5.0)
Temazepam: NEGATIVE ng/mL (ref <0.50)
Tramadol: NEGATIVE ng/mL (ref <5.0)
Triazolam: NEGATIVE ng/mL (ref <0.50)
Zolpidem: NEGATIVE ng/mL (ref <5.0)

## 2021-04-03 LAB — DRUG TOX ALC METAB W/CON, ORAL FLD: Alcohol Metabolite: NEGATIVE ng/mL (ref <25)

## 2021-04-03 LAB — DRUG TOX METHYLPHEN W/CONF,ORAL FLD: Methylphenidate: NEGATIVE ng/mL (ref <1.0)

## 2021-04-04 ENCOUNTER — Other Ambulatory Visit: Payer: Self-pay | Admitting: Physical Medicine and Rehabilitation

## 2021-04-05 MED ORDER — OXYCODONE HCL 15 MG PO TABS: 15.0000 mg | ORAL_TABLET | ORAL | 0 refills | Status: DC | PRN

## 2021-04-05 NOTE — Telephone Encounter (Signed)
Spoke with Dr Julaine Fusi- feels pt's confusion is due to possible toxicity due to Dilaudid- she noted this can sometimes occur with Dilaudid and suggested going back to Oxycodone- so will try Oxycodone 15 mg q4 hours x 1 week- don't take Dilaudid- and see if confusion clears up at all-   Let wife know this exact plan and to not take both at the same time.   Let me know in 1 week.

## 2021-04-06 ENCOUNTER — Telehealth: Payer: Self-pay | Admitting: *Deleted

## 2021-04-06 NOTE — Telephone Encounter (Signed)
Oral swab for drug and alcohol assessment if positive for prescribed fentanyl and hydromorphone. He is also positive for methadone. This is not a currently prescribed medication. He did receive one time Rx for #14 06/10/20.

## 2021-04-11 ENCOUNTER — Other Ambulatory Visit: Payer: Self-pay | Admitting: Physical Medicine and Rehabilitation

## 2021-04-28 ENCOUNTER — Telehealth: Payer: Self-pay

## 2021-04-28 MED ORDER — FENTANYL 75 MCG/HR TD PT72: 1.0000 | MEDICATED_PATCH | TRANSDERMAL | 0 refills | Status: DC

## 2021-04-28 NOTE — Telephone Encounter (Signed)
PMP report:   04/04/2021 02/10/2021 1  Clonazepam 0.5 Mg Tablet 30.00 15 Me Lov [id number] Nor ([id number]) 2/5 2.00 LME Worker's Comp [place] 03/31/2021 03/29/2021 1  Hydromorphone 4 Mg Tablet 260.00 28 Me Lov [id number] Nor ([id number]) 0/0 148.57 MME Worker's Comp [place] 03/18/2021 03/17/2021 1  Fentanyl 75 Mcg/hr Patch 15.00 30 Me Lov [id number] Nor ([id number]) 0/0 270.00 MME Worker's Comp [place]

## 2021-04-30 ENCOUNTER — Telehealth: Payer: Self-pay | Admitting: *Deleted

## 2021-04-30 MED ORDER — GABAPENTIN 300 MG PO CAPS: 300.0000 mg | ORAL_CAPSULE | Freq: Three times a day (TID) | ORAL | 5 refills | Status: DC

## 2021-04-30 NOTE — Telephone Encounter (Signed)
Pharmacy sent a fax requesting clarification on gabapentin and any refills. I don't see it mentioned in last note.

## 2021-05-10 ENCOUNTER — Encounter: Payer: Self-pay | Admitting: Physical Medicine and Rehabilitation

## 2021-05-10 ENCOUNTER — Other Ambulatory Visit: Payer: Self-pay

## 2021-05-10 ENCOUNTER — Encounter
Payer: No Typology Code available for payment source | Attending: Physical Medicine and Rehabilitation | Admitting: Physical Medicine and Rehabilitation

## 2021-05-10 VITALS — BP 116/78 | HR 96 | Temp 97.7°F | Ht 76.0 in

## 2021-05-10 DIAGNOSIS — F329 Major depressive disorder, single episode, unspecified: Secondary | ICD-10-CM | POA: Diagnosis present

## 2021-05-10 DIAGNOSIS — G894 Chronic pain syndrome: Secondary | ICD-10-CM | POA: Insufficient documentation

## 2021-05-10 DIAGNOSIS — M8669 Other chronic osteomyelitis, multiple sites: Secondary | ICD-10-CM | POA: Insufficient documentation

## 2021-05-10 DIAGNOSIS — E43 Unspecified severe protein-calorie malnutrition: Secondary | ICD-10-CM | POA: Insufficient documentation

## 2021-05-10 DIAGNOSIS — Z5181 Encounter for therapeutic drug level monitoring: Secondary | ICD-10-CM | POA: Insufficient documentation

## 2021-05-10 DIAGNOSIS — Z79891 Long term (current) use of opiate analgesic: Secondary | ICD-10-CM | POA: Diagnosis present

## 2021-05-10 DIAGNOSIS — Z993 Dependence on wheelchair: Secondary | ICD-10-CM | POA: Diagnosis present

## 2021-05-10 MED ORDER — MORPHINE SULFATE 15 MG PO TABS: 15.0000 mg | ORAL_TABLET | ORAL | 0 refills | Status: DC | PRN

## 2021-05-10 NOTE — Patient Instructions (Signed)
Pt is a 55 yr old male with hx of Multitrauma- causing L femoral neck fx, degloving of L hip to going/scrotum, bladder neck trauma- got SPC, developed compartment syndrome -s/p surgery for that; also diverting colostomy, skin grafts, and s/p trach and PEG- PEG is out. 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 S/P screw removal and on IV ABX for L hip osteomyelitis. 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   Will switch Dilaudid to MSIR- short acting morphine- -usually takes Dilaudid 4 mg 5 pills/day- will Switch to MSIR/morphine 15 mg 5x/day as needed- to replace Dilaudid.   2. Con't Duragesic 75 mcg q2 days for chronic pain   3.  Cannot tell if pt has a SCI, however if he does, it's mild- I think his tension is his tension, not spasticity;    4. Needs ot restart PT and OT- will have Arline Asp help look into this for pt.    5. Con't Klonopin as needed and Abilify; Quetiapine for agitation; trazodone and Paxil; and Robaxin; and Narcan.   6. Needs to see psychiatry- as soon as possible. Will d/w Worker's comp  7. F/U in 6-8 weeks- double appointment Oral drug screen today.   8. Getting EMG in December.

## 2021-05-10 NOTE — Progress Notes (Signed)
Subjective:    Patient ID: Christopher [name], male    DOB: 1965/10/12, 55 y.o.   MRN: [id number]  HPI  Pt is a 55 yr old male with hx of Multitrauma- causing L femoral neck fx, degloving of L hip to going/scrotum, bladder neck trauma- got SPC, developed compartment syndrome -s/p surgery for that; also diverting colostomy, skin grafts, and s/p trach and PEG- PEG is out. 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 S/P screw removal and on IV ABX for L hip osteomyelitis. 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    Here for f/u on chronic osteomyelitis.   This AM, was really confused- talking about something, and put together with something else, that didn't match.   Starting last Saturday, started thinking she's cheating on him- Siblings came this weekend- and then started thinking she was cheating on him- said this to wife's daughter.   Dilaudid used ot cause him to be sleepy- and per d/w Dr Hilma Favors, she thinks the Dilaudid is causing pt to be more confused- we can try to change to [facility]- morphine  Taking things day by day.  Ate thanksgiving- better than usual.  If he doesn't eat, will do full dose of TF's and if eats so-so- then gives ~ 1/2  A lot of ice cream- a whole container in 1 day.    Wife was deathly sick 2 weeks ago.  Pt wasn't.    During that time, cancelled PT and OT- and they haven't come back for some reason.  Nursing- says she put in order for therapy to come back.    Pain Inventory Average Pain 9 Pain Right Now 9 My pain is constant, sharp, burning, dull, stabbing, tingling, and aching  LOCATION OF PAIN  back and buttocks  BOWEL History of colostomy Yes     BLADDER Suprapubic    Mobility walk with assistance use a walker ability to climb steps?  no do you drive?  no use a wheelchair needs help with transfers  Function disabled: date disabled .  Neuro/Psych weakness numbness tremor tingling trouble walking spasms dizziness confusion depression anxiety  Prior Studies Any changes since last visit?  no  Physicians involved in your care Any changes since last visit?  no   Family History  Problem Relation Age of Onset   Breast cancer Mother        with mets to the bones   Social History   Socioeconomic History   Marital status: Married    Spouse name: Not on file   Number of children: Not on file   Years of education: Not on file   Highest education level: Not on file  Occupational History   Occupation: Disable  Tobacco Use   Smoking status: Former    Packs/day: 1.00    Years: 20.00    Pack years: 20.00    Types: Cigarettes    Quit date: 07/10/2018    Years since quitting: 2.8   Smokeless tobacco: Never  Vaping Use   Vaping Use: Never used  Substance and Sexual Activity   Alcohol use: Never   Drug use: Yes    Types: Oxycodone, Fentanyl  Comment: Fentanyl patch/oxycodone since 06/2018   Sexual activity: Yes  Other Topics Concern   Not on file  Social History Narrative   ** Merged History Encounter **       Social Determinants of Health   Financial Resource Strain: Not on file  Food Insecurity: Not on file  Transportation Needs: Not on file  Physical Activity: Not on file  Stress: Not on file  Social Connections: Not on file   Past Surgical History:  Procedure Laterality Date   APPLICATION OF A-CELL OF BACK N/A 08/06/2018   Procedure: Application Of A-Cell Of Back;  Surgeon: [name], DO;  Location: [place];  Service: Plastics;  Laterality: N/A;   APPLICATION OF A-CELL OF EXTREMITY Left 08/06/2018   Procedure: Application Of A-Cell Of Extremity;  Surgeon: [name],  DO;  Location: [place];  Service: Plastics;  Laterality: Left;   APPLICATION OF A-CELL OF EXTREMITY Left 09/18/2019   Procedure: APPLICATION OF A-CELL OF EXTREMITY;  Surgeon: [name], DO;  Location: [place];  Service: Plastics;  Laterality: Left;   APPLICATION OF WOUND VAC  07/12/2018   Procedure: Application Of Wound Vac to the Left Thigh and Scrotum.;  Surgeon: Shona Needles, MD;  Location: [place];  Service: Orthopedics;;   APPLICATION OF WOUND VAC  07/10/2018   Procedure: Application Of Wound Vac;  Surgeon: Clovis Riley, MD;  Location: [place];  Service: General;;   COLON SURGERY  2020   colostomy   COLOSTOMY N/A 07/23/2018   Procedure: COLOSTOMY;  Surgeon: Georganna Skeans, MD;  Location: [place];  Service: General;  Laterality: N/A;   CYSTOSCOPY W/ URETERAL STENT PLACEMENT N/A 07/15/2018   Procedure: RETROGRADE URETHROGRAM;  Surgeon: Franchot Gallo, MD;  Location: [place];  Service: Urology;  Laterality: N/A;   CYSTOSCOPY WITH LITHOLAPAXY N/A 05/06/2019   Procedure: CYSTOSCOPY BASKET BLADDER STONE EXTRACTION;  Surgeon: Cleon Gustin, MD;  Location: [facility];  Service: Urology;  Laterality: N/A;  30 MINS   CYSTOSTOMY N/A 05/06/2019   Procedure: REPLACEMENT OF SUPRAPUBIC CATHETER;  Surgeon: Cleon Gustin, MD;  Location: [facility];  Service: Urology;  Laterality: N/A;   DEBRIDEMENT AND CLOSURE WOUND Left 03/04/2019   Procedure: Excision of hip wound with placement of Acell;  Surgeon: [name], DO;  Location: [place];  Service: Plastics;  Laterality: Left;   ESOPHAGOGASTRODUODENOSCOPY N/A 08/14/2018   Procedure: ESOPHAGOGASTRODUODENOSCOPY (EGD);  Surgeon: Georganna Skeans, MD;  Location: [place];  Service: General;  Laterality: N/A;  bedside   FACIAL RECONSTRUCTION SURGERY     X 2--once as a teenager and second time in his 53's   HARDWARE REMOVAL Left 03/04/2019   Procedure: Left Hip Hardware Removal;  Surgeon: Shona Needles,  MD;  Location: [place];  Service: Orthopedics;  Laterality: Left;   HIP PINNING,CANNULATED Left 07/12/2018   Procedure: CANNULATED HIP PINNING;  Surgeon: Shona Needles, MD;  Location: [place];  Service: Orthopedics;  Laterality: Left;   HIP SURGERY     HOLMIUM LASER APPLICATION Right [id number]   Procedure: HOLMIUM LASER APPLICATION;  Surgeon: Cleon Gustin, MD;  Location: [facility];  Service: Urology;  Laterality: Right;   I & D EXTREMITY Left 07/25/2018   Procedure: Debridement of buttock, scrotum and left leg, placement of acell and vac;  Surgeon: [name], DO;  Location: [place];  Service: Plastics;  Laterality: Left;   I & D EXTREMITY N/A 08/06/2018  Procedure: Debridement of buttock, scrotum and left leg;  Surgeon: Peggye Form, DO;  [facility];  Service: Plastics;  Laterality: N/A;   I & D EXTREMITY N/A 08/13/2018   Procedure: Debridement of buttock, scrotum and left leg, placement of acell and vac;  Surgeon: Peggye Form, DO;  [facility];  Service: Plastics;  Laterality: N/A;  90 min, please   INCISION AND DRAINAGE HIP Left 09/18/2019   Procedure: IRRIGATION AND DEBRIDEMENT HIP/ PELVIS WITH WOUND VAC PLACEMENT;  Surgeon: Roby Lofts, MD;  [facility];  Service: Orthopedics;  Laterality: Left;   INCISION AND DRAINAGE OF WOUND N/A 07/18/2018   Procedure: Debridement of left leg, buttocks and scrotal wound with placement of acell and Flexiseal;  Surgeon: Peggye Form, DO;  [facility];  Service: Plastics;  Laterality: N/A;   INCISION AND DRAINAGE OF WOUND Left 08/29/2018   Procedure: Debridement of buttock, scrotum and left leg, placement of acell and vac;  Surgeon: Peggye Form, DO;  [facility];  Service: Plastics;  Laterality: Left;  75 min, please   INCISION AND DRAINAGE OF WOUND Bilateral 10/23/2018   Procedure: DEBRIDEMENT OF BUTTOCK,SCROTUM, AND LEG WOUNDS WITH PLACEMENT OF ACELL- BILATERAL 90 MIN;  Surgeon:  Peggye Form, DO;  [facility];  Service: Plastics;  Laterality: Bilateral;   IR ANGIOGRAM PELVIS SELECTIVE OR SUPRASELECTIVE  07/10/2018   IR ANGIOGRAM PELVIS SELECTIVE OR SUPRASELECTIVE  07/10/2018   IR ANGIOGRAM SELECTIVE EACH ADDITIONAL VESSEL  07/10/2018   IR EMBO ART  VEN HEMORR LYMPH EXTRAV  INC GUIDE ROADMAPPING  07/10/2018   IR GASTROSTOMY TUBE MOD SED  01/21/2021   IR NEPHROSTOMY PLACEMENT LEFT  04/05/2019   IR NEPHROSTOMY PLACEMENT RIGHT  05/31/2019   IR US GUIDE BX ASP/DRAIN  07/10/2018   IR US GUIDE VASC ACCESS RIGHT  07/10/2018   IR VENO/EXT/UNI LEFT  07/10/2018   IRRIGATION AND DEBRIDEMENT OF WOUND WITH SPLIT THICKNESS SKIN GRAFT Left 09/19/2018   Procedure: Debridement of gluteal wound with placement of acell to left leg.;  Surgeon: Peggye Form, DO;  [facility];  Service: Plastics;  Laterality: Left;  2.5 hours, please   LAPAROTOMY N/A 07/12/2018   Procedure: EXPLORATORY LAPAROTOMY;  Surgeon: Violeta Gelinas, MD;  Location: [facility] OR;  Service: General;  Laterality: N/A;   LAPAROTOMY N/A 07/15/2018   Procedure: WOUND EXPLORATION; CLOSURE OF ABDOMEN;  Surgeon: Violeta Gelinas, MD;  Location: [facility] OR;  Service: General;  Laterality: N/A;   LAPAROTOMY  07/10/2018   Procedure: Exploratory Laparotomy;  Surgeon: Berna Bue, MD;  Location: [facility] OR;  Service: General;;   MASS EXCISION Left 09/18/2019   Procedure: EXCISION UPPER LEFT INNER THIGH WOUND;  Surgeon: Peggye Form, DO;  [facility];  Service: Plastics;  Laterality: Left;   NEPHROLITHOTOMY Right 07/15/2019   Procedure: NEPHROLITHOTOMY PERCUTANEOUS;  Surgeon: Malen Gauze, MD;  Location: [facility];  Service: Urology;  Laterality: Right;  90 MINS   PEG PLACEMENT N/A 08/14/2018   Procedure: PERCUTANEOUS ENDOSCOPIC GASTROSTOMY (PEG) PLACEMENT;  Surgeon: Violeta Gelinas, MD;  Location: [facility] ENDOSCOPY;  Service: General;  Laterality: N/A;   PERCUTANEOUS TRACHEOSTOMY N/A 08/02/2018    Procedure: PERCUTANEOUS TRACHEOSTOMY;  Surgeon: Violeta Gelinas, MD;  Location: [facility] OR;  Service: General;  Laterality: N/A;   RADIOLOGY WITH ANESTHESIA N/A 07/10/2018   Procedure: IR WITH ANESTHESIA;  Surgeon: Simonne Come, MD;  Location: [facility] OR;  Service: Radiology;  Laterality: N/A;  RADIOLOGY WITH ANESTHESIA Right 07/10/2018   Procedure: Ir With Anesthesia;  Surgeon: Simonne Come, MD;  Location: [facility] OR;  Service: Radiology;  Laterality: Right;   SCROTAL EXPLORATION N/A 07/15/2018   Procedure: SCROTUM DEBRIDEMENT;  Surgeon: Marcine Matar, MD;  Location: [facility] OR;  Service: Urology;  Laterality: N/A;   SHOULDER SURGERY     SKIN SPLIT GRAFT Right 09/19/2018   Procedure: Skin Graft Split Thickness;  Surgeon: Peggye Form, DO;  [facility];  Service: Plastics;  Laterality: Right;   SKIN SPLIT GRAFT N/A 10/03/2018   Procedure: Split thickness skin graft to gluteal area with acell placement;  Surgeon: Peggye Form, DO;  [facility];  Service: Plastics;  Laterality: N/A;  3 hours, please   VACUUM ASSISTED CLOSURE CHANGE N/A 07/12/2018   Procedure: ABDOMINAL VACUUM ASSISTED CLOSURE CHANGE and abdominal washout;  Surgeon: Violeta Gelinas, MD;  Location: [facility] OR;  Service: General;  Laterality: N/A;   WOUND DEBRIDEMENT Left 07/23/2018   Procedure: DEBRIDEMENT LEFT BUTTOCK  WOUND;  Surgeon: Violeta Gelinas, MD;  Location: [facility] OR;  Service: General;  Laterality: Left;   WOUND EXPLORATION Left 07/10/2018   Procedure: WOUND EXPLORATION LEFT GROIN;  Surgeon: Berna Bue, MD;  [facility];  Service: General;  Laterality: Left;   Past Medical History:  Diagnosis Date   Acute on chronic respiratory failure with hypoxia (HCC) 06/2018   trach removed 11-16-2018, on vent from jan until may 2020 - uses albuterol prn   Anxiety    Bacteremia due to Pseudomonas 06/2018   Chronic osteomyelitis (HCC)    Chronic pain syndrome    Clostridium difficile colitis 10/30/2019   tx with abx     Depression    DVT (deep venous thrombosis) (HCC) 2020   right brachial post PICC line   History of blood transfusion 06/2018   History of Clostridioides difficile colitis    History of kidney stones    Hypertension    norvasc d/c by pcp on 11/05/19   Multiple traumatic injuries    Penile pain 11/18/2019   Pneumonia 11/2009   2020 x 2   Walker as ambulation aid    Wheelchair bound    electric   Wound discharge    left hip wound with bloody/clear drainage change dressing q day surgilube with gauze, between legs wound using calcium algenate pad bid   BP 116/78   Pulse 96   Temp 97.7 F (36.5 C)   Ht 6\' 4"  (1.93 m)   SpO2 95%   BMI 18.87 kg/m   Opioid Risk Score:   Fall Risk Score:  `1  Depression screen PHQ 2/9  Depression screen [facility] 2/9 05/10/2021 03/29/2021 02/10/2021 01/01/2021 09/02/2020 07/02/2020 03/17/2020  Decreased Interest 1 1 3 3 1 3  0  Down, Depressed, Hopeless 1 1 3 3 1 2 1   PHQ - 2 Score 2 2 6 6 2 5 1   Altered sleeping - - - - - 3 -  Tired, decreased energy - - - - - 3 -  Change in appetite - - - - - 3 -  Feeling bad or failure about yourself  - - - 3 - 2 -  Trouble concentrating - - - - - 3 -  Moving slowly or fidgety/restless - - - - - 3 -  Suicidal thoughts - - - 0 - 0 -  PHQ-9 Score - - - - - 22 -  Difficult doing work/chores - - - Extremely dIfficult - - -  Some recent data might be hidden    Review of Systems  Constitutional: Negative.   HENT: Negative.    Eyes: Negative.   Respiratory: Negative.    Cardiovascular: Negative.   Gastrointestinal:        Colostomy  Endocrine: Negative.   Genitourinary:        Suprapubic cath  Musculoskeletal:  Positive for back pain and myalgias.  Skin:  Positive for wound.  Allergic/Immunologic: Negative.   Neurological:  Positive for dizziness, tremors, weakness and numbness.       Tingling  Hematological: Negative.   Psychiatric/Behavioral:  Positive for confusion and dysphoric mood. The patient is  nervous/anxious.   All other systems reviewed and are negative.     Objective:   Physical Exam Awake, alert, but appears more depressed today; NAD Flat affect In power w/c Accompanied by wife; wearing O2 by ;  MS: RLE- HF 4+/5; KE- 4+/5, DF 4+/5 and PF 5-/5 LLE- HF 3+/5; KE 4-/5; DF 4-/5 and PF 4/5  Neuro: DTRs 3+ in patella B/L- slightly more on LLE- however achilles is 2+ B/L  Cannot get clonus, however pt tenses up with any ROM, so cannot assess It's hard to see if has tone- but fights the exam immediately due to apprehension/expecting pain.  Might have increased tone in LLE, but pain inhibition makes it difficult to tell.  Intact to light touch in all 4 extremities.   RUE- curling up of 4th/5th digits- and starting to deviate radially      Assessment & Plan:   Pt is a 55 yr old male with hx of Multitrauma- causing L femoral neck fx, degloving of L hip to going/scrotum, bladder neck trauma- got SPC, developed compartment syndrome -s/p surgery for that; also diverting colostomy, skin grafts, and s/p trach and PEG- PEG is out. 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 S/P screw removal and on IV ABX for L hip osteomyelitis. 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   Will switch Dilaudid to MSIR- short acting morphine- -usually takes Dilaudid 4 mg 5 pills/day- will Switch to MSIR/morphine 15 mg 5x/day as needed- to replace Dilaudid.   2. Con't Duragesic 75 mcg q2 days for chronic pain   3.  Cannot tell if pt has a SCI, however if he  does, it's mild- I think his tension is his tension, not spasticity;    4. Needs ot restart PT and OT- will have Jenny Reichmann help look into this for pt.    5. Con't Klonopin as needed and Abilify; Quetiapine for agitation; trazodone and Paxil; and Robaxin; and Narcan.   6. Needs to see psychiatry- as soon as possible. Will d/w Worker's comp  7. F/U in 6-8 weeks- double appointment Oral drug screen today.   8. Getting EMG in December.    I spent a total of 41 minutes on total visit- talking with pt/wife as well as d/w Jenny Reichmann from [employer]- about plan- also due for Short acting pain meds- refilling with Morphine instead to try and reduce confusion.

## 2021-05-11 ENCOUNTER — Telehealth: Payer: Self-pay

## 2021-05-11 ENCOUNTER — Other Ambulatory Visit: Payer: Self-pay | Admitting: Family

## 2021-05-11 ENCOUNTER — Telehealth: Payer: Self-pay | Admitting: *Deleted

## 2021-05-11 DIAGNOSIS — M86652 Other chronic osteomyelitis, left thigh: Secondary | ICD-10-CM

## 2021-05-11 DIAGNOSIS — Z9189 Other specified personal risk factors, not elsewhere classified: Secondary | ICD-10-CM

## 2021-05-11 MED ORDER — VANCOMYCIN HCL 125 MG PO CAPS: 125.0000 mg | ORAL_CAPSULE | Freq: Two times a day (BID) | ORAL | 1 refills | Status: DC

## 2021-05-11 NOTE — Telephone Encounter (Signed)
Called patient to get a virtual appointment scheduled with [name] next week, left patient a voicemail to call us back to get scheduled.

## 2021-05-11 NOTE — Telephone Encounter (Signed)
[facility] called to verify the address and that they can send a drug screen kit to our office for Christopher [name]'s 08/13/21 appt. I have verified the address with them and marked the appt for drug screen.

## 2021-05-11 NOTE — Telephone Encounter (Signed)
Okay to refill? Patient does not have follow up scheduled. Do you want to refill the PO Vancomycin as well? Thanks

## 2021-05-13 ENCOUNTER — Telehealth: Payer: Self-pay

## 2021-05-13 LAB — DRUG TOX MONITOR 1 W/CONF, ORAL FLD
Amphetamines: NEGATIVE ng/mL (ref <10)
Barbiturates: NEGATIVE ng/mL (ref <10)
Benzodiazepines: NEGATIVE ng/mL (ref <0.50)
Buprenorphine: NEGATIVE ng/mL (ref <0.10)
Cocaine: NEGATIVE ng/mL (ref <5.0)
Codeine: NEGATIVE ng/mL (ref <2.5)
Dihydrocodeine: NEGATIVE ng/mL (ref <2.5)
Fentanyl: 4.16 ng/mL — ABNORMAL HIGH (ref <0.10)
Fentanyl: POSITIVE ng/mL — AB (ref <0.10)
Heroin Metabolite: NEGATIVE ng/mL (ref <1.0)
Hydrocodone: NEGATIVE ng/mL (ref <2.5)
Hydromorphone: 18.4 ng/mL — ABNORMAL HIGH (ref <2.5)
MARIJUANA: NEGATIVE ng/mL (ref <2.5)
MDMA: NEGATIVE ng/mL (ref <10)
Meprobamate: NEGATIVE ng/mL (ref <2.5)
Methadone: NEGATIVE ng/mL (ref <5.0)
Morphine: NEGATIVE ng/mL (ref <2.5)
Nicotine Metabolite: NEGATIVE ng/mL (ref <5.0)
Norhydrocodone: NEGATIVE ng/mL (ref <2.5)
Noroxycodone: NEGATIVE ng/mL (ref <2.5)
Opiates: POSITIVE ng/mL — AB (ref <2.5)
Oxycodone: NEGATIVE ng/mL (ref <2.5)
Oxymorphone: NEGATIVE ng/mL (ref <2.5)
Phencyclidine: NEGATIVE ng/mL (ref <10)
Tapentadol: NEGATIVE ng/mL (ref <5.0)
Tramadol: NEGATIVE ng/mL (ref <5.0)
Zolpidem: NEGATIVE ng/mL (ref <5.0)

## 2021-05-13 NOTE — Telephone Encounter (Signed)
Called patient again in regards to getting a virtual appointment set up with [name] next week, left patient a voicemail to give [place] a call.

## 2021-05-17 ENCOUNTER — Telehealth: Payer: Self-pay

## 2021-05-17 DIAGNOSIS — F333 Major depressive disorder, recurrent, severe with psychotic symptoms: Secondary | ICD-10-CM

## 2021-05-17 NOTE — Telephone Encounter (Signed)
Arline [facility] called stating there needs to be a separate referral in the system for her to schedule patient with psychiatry. Please advise.

## 2021-05-18 ENCOUNTER — Encounter (HOSPITAL_BASED_OUTPATIENT_CLINIC_OR_DEPARTMENT_OTHER): Payer: No Typology Code available for payment source | Attending: Internal Medicine | Admitting: Internal Medicine

## 2021-05-18 ENCOUNTER — Other Ambulatory Visit: Payer: Self-pay

## 2021-05-18 ENCOUNTER — Telehealth: Payer: Self-pay

## 2021-05-18 DIAGNOSIS — Z87891 Personal history of nicotine dependence: Secondary | ICD-10-CM | POA: Insufficient documentation

## 2021-05-18 DIAGNOSIS — S71002D Unspecified open wound, left hip, subsequent encounter: Secondary | ICD-10-CM | POA: Insufficient documentation

## 2021-05-18 DIAGNOSIS — M8630 Chronic multifocal osteomyelitis, unspecified site: Secondary | ICD-10-CM | POA: Diagnosis not present

## 2021-05-18 DIAGNOSIS — S71002A Unspecified open wound, left hip, initial encounter: Secondary | ICD-10-CM

## 2021-05-18 DIAGNOSIS — Y990 Civilian activity done for income or pay: Secondary | ICD-10-CM | POA: Diagnosis not present

## 2021-05-18 NOTE — Telephone Encounter (Signed)
-----   Message from Sandie Ano, RN sent at 05/11/2021 12:21 PM EST ----- Please see if patient is available for virtual appointment with [name] some time next week. Thanks! ----- Message ----- From: Veryl Speak, FNP Sent: 05/11/2021  12:20 PM EST To: Sandie Ano, RN  Likely virtual should be fine.  ----- Message ----- From: Sandie Ano, RN Sent: 05/11/2021  11:41 AM EST To: Veryl Speak, FNP  I sent in cipro and vanc for him. When do you want to see him back, and would you like in person or virtual? Thanks

## 2021-05-18 NOTE — Telephone Encounter (Signed)
Left patient a voice mail to call back to schedule a virtual visit with [facility]
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
## 2021-05-18 NOTE — Progress Notes (Signed)
Christopher [name] ([id number]) Visit Report for 05/18/2021 Arrival Information Details Patient Name: Date of Service: Christopher [name] MES B. 05/18/2021 10:15 A M Medical Record Number: [id number] Patient Account Number: [id number] Date of Birth/Sex: Treating RN: 15-Apr-1966 (55 y.o. Christopher [name] Primary Care Rosilyn Coachman: Jacquiline Doe Other Clinician: Referring Kylina Vultaggio: Treating Lavella Myren/Extender: Gretchen Portela in Treatment: 8 Visit Information History Since Last Visit Added or deleted any medications: No Patient Arrived: Wheel Chair Any new allergies or adverse reactions: No Arrival Time: 10:28 Had a fall or experienced change in No Accompanied By: spouse activities of daily living that may affect Transfer Assistance: Manual risk of falls: Patient Requires Transmission-Based Precautions: No Signs or symptoms of abuse/neglect since last visito No Patient Has Alerts: No Hospitalized since last visit: No Implantable device outside of the clinic excluding No cellular tissue based products placed in the center since last visit: Has Dressing in Place as Prescribed: Yes Pain Present Now: Yes Electronic Signature(s) Signed: 05/18/2021 5:02:41 PM By: Karie Schwalbe RN Entered By: Karie Schwalbe on 05/18/2021 10:29:53 -------------------------------------------------------------------------------- Clinic Level of Care Assessment Details Patient Name: Date of Service: Christopher [name] MES B. 05/18/2021 10:15 A M Medical Record Number: [id number] Patient Account Number: [id number] Date of Birth/Sex: Treating RN: 06-07-1966 (55 y.o. Christopher [name] Primary Care Shanesha Bednarz: Jacquiline Doe Other Clinician: Referring Virgie Chery: Treating Makai Agostinelli/Extender: Gretchen Portela in Treatment: 8 Clinic Level of Care Assessment Items TOOL 4 Quantity Score X- 1 0 Use when only an EandM is performed on FOLLOW-UP visit ASSESSMENTS - Nursing Assessment /  Reassessment X- 1 10 Reassessment of Co-morbidities (includes updates in patient status) X- 1 5 Reassessment of Adherence to Treatment Plan ASSESSMENTS - Wound and Skin A ssessment / Reassessment X - Simple Wound Assessment / Reassessment - one wound 1 5 []  - 0 Complex Wound Assessment / Reassessment - multiple wounds X- 1 10 Dermatologic / Skin Assessment (not related to wound area) ASSESSMENTS - Focused Assessment []  - 0 Circumferential Edema Measurements - multi extremities X- 1 10 Nutritional Assessment / Counseling / Intervention []  - 0 Lower Extremity Assessment (monofilament, tuning fork, pulses) []  - 0 Peripheral Arterial Disease Assessment (using hand held doppler) ASSESSMENTS - Ostomy and/or Continence Assessment and Care []  - 0 Incontinence Assessment and Management []  - 0 Ostomy Care Assessment and Management (repouching, etc.) PROCESS - Coordination of Care X - Simple Patient / Family Education for ongoing care 1 15 []  - 0 Complex (extensive) Patient / Family Education for ongoing care X- 1 10 Staff obtains Consents, Records, T Results / Process Orders est []  - 0 Staff telephones HHA, Nursing Homes / Clarify orders / etc []  - 0 Routine Transfer to another Facility (non-emergent condition) []  - 0 Routine Hospital Admission (non-emergent condition) []  - 0 New Admissions / / Ordering NPWT Apligraf, etc. , []  - 0 Emergency Hospital Admission (emergent condition) X- 1 10 Simple Discharge Coordination []  - 0 Complex (extensive) Discharge Coordination PROCESS - Special Needs []  - 0 Pediatric / Minor Patient Management []  - 0 Isolation Patient Management []  - 0 Hearing / Language / Visual special needs []  - 0 Assessment of Community assistance (transportation, D/C planning, etc.) []  - 0 Additional assistance / Altered mentation []  - 0 Support Surface(s) Assessment (bed, cushion, seat, etc.) INTERVENTIONS - Wound Cleansing /  Measurement X - Simple Wound Cleansing - one wound 1 5 []  - 0 Complex Wound Cleansing - multiple wounds X- 1 5 Wound Imaging (  photographs - any number of wounds) []  - 0 Wound Tracing (instead of photographs) X- 1 5 Simple Wound Measurement - one wound []  - 0 Complex Wound Measurement - multiple wounds INTERVENTIONS - Wound Dressings X - Small Wound Dressing one or multiple wounds 1 10 []  - 0 Medium Wound Dressing one or multiple wounds []  - 0 Large Wound Dressing one or multiple wounds []  - 0 Application of Medications - topical []  - 0 Application of Medications - injection INTERVENTIONS - Miscellaneous []  - 0 External ear exam []  - 0 Specimen Collection (cultures, biopsies, blood, body fluids, etc.) []  - 0 Specimen(s) / Culture(s) sent or taken to Lab for analysis []  - 0 Patient Transfer (multiple staff / / Similar devices) []  - 0 Simple Staple / Suture removal (25 or less) []  - 0 Complex Staple / Suture removal (26 or more) []  - 0 Hypo / Hyperglycemic Management (close monitor of Blood Glucose) []  - 0 Ankle / Brachial Index (ABI) - do not check if billed separately X- 1 5 Vital Signs Has the patient been seen at the hospital within the last three years: Yes Total Score: 105 Level Of Care: New/Established - Level 3 Electronic Signature(s) Signed: 05/18/2021 5:00:25 PM By: RN, BSN Entered By: on 05/18/2021 11:08:29 -------------------------------------------------------------------------------- Encounter Discharge Information Details Patient Name: Date of Service: , MES B. 05/18/2021 10:15 A M Medical Record Number: Patient Account Number: Date of Birth/Sex: Treating RN: 19-Nov-1965 (55 y.o. Primary Care Christopher [name]: Other Clinician: Referring Tayler Heiden: Treating [name]/Extender: in Treatment: 8 Encounter Discharge  Information Items Discharge Condition: Stable Ambulatory Status: Wheelchair Discharge Destination: Home Transportation: Private Auto Accompanied By: family Schedule Follow-up Appointment: Yes Clinical Summary of Care: Electronic Signature(s) Signed: 05/18/2021 5:00:25 PM By: Shawn Stall RN, BSN Entered By: Shawn Stall on 05/18/2021 11:09:02 -------------------------------------------------------------------------------- Lower Extremity Assessment Details Patient Name: Date of Service: [name] MES B. 05/18/2021 10:15 A M Medical Record Number: 14/11/2020 Patient Account Number: [id number] Date of Birth/Sex: Treating RN: [birth year] (55 y.o. 57 Primary Care Amato Sevillano: Christopher [name] Other Clinician: Referring Ramin Zoll: Treating Nia Nathaniel/Extender: Jacquiline Doe Weeks in Treatment: 8 Electronic Signature(s) Signed: 05/18/2021 5:02:41 PM By: 14/11/2020 RN Entered By: Shawn Stall on 05/18/2021 10:33:28 -------------------------------------------------------------------------------- Multi Wound Chart Details Patient Name: Date of Service: 14/11/2020, Christopher [name] MES B. 05/18/2021 10:15 A M Medical Record Number: [id number] Patient Account Number: [id number] Date of Birth/Sex: Treating RN: 02-Feb-1966 (55 y.o. Christopher [name] Primary Care Jeanenne Licea: Jacquiline Doe Other Clinician: Referring Leelan Rajewski: Treating Shantasia Hunnell/Extender: Rulon Sera Weeks in Treatment: 8 Vital Signs Height(in): 75 Pulse(bpm): 102 Weight(lbs): 160 Blood Pressure(mmHg): 124/80 Body Mass Index(BMI): 20 Temperature(F): 97.8 Respiratory Rate(breaths/min): 18 Photos: [N/A:N/A] Left Trochanter N/A N/A Wound Location: Trauma N/A N/A Wounding Event: Trauma, Other N/A N/A Primary Etiology: Hypertension, Osteomyelitis N/A N/A Comorbid History: 07/10/2018 N/A N/A Date Acquired: 8 N/A N/A Weeks of Treatment: Open N/A N/A Wound Status: 0.4x1.5x4 N/A  N/A Measurements L x W x D (cm) 0.471 N/A N/A A (cm) : rea 1.885 N/A N/A Volume (cm) : 0.00% N/A N/A % Reduction in A rea: 0.00% N/A N/A % Reduction in Volume: Full Thickness Without Exposed N/A N/A Classification: Support Structures Medium N/A N/A Exudate Amount: Serosanguineous N/A N/A Exudate Type: red, brown N/A N/A Exudate Color: Distinct, outline attached N/A N/A Wound Margin: Large (67-100%) N/A N/A Granulation Amount: Red, Pink N/A  N/A Granulation Quality: None Present (0%) N/A N/A Necrotic Amount: Fat Layer (Subcutaneous Tissue): Yes N/A N/A Exposed Structures: Fascia: No Tendon: No Muscle: No Joint: No Bone: No None N/A N/A Epithelialization: Treatment Notes Wound #1 (Trochanter) Wound Laterality: Left Cleanser Wound Cleanser Discharge Instruction: Cleanse the wound with wound cleanser prior to applying a clean dressing using gauze sponges, not tissue or cotton balls. Peri-Wound Care Skin Prep Discharge Instruction: Use skin prep as directed Topical Primary Dressing Iodoform packing strip 1/2 (in) Discharge Instruction: Lightly pack as instructed Secondary Dressing Woven Gauze Sponge, Non-Sterile 4x4 in Discharge Instruction: Apply over primary dressing as directed. Zetuvit Plus Silicone Border Dressing 4x4 (in/in) Discharge Instruction: Apply silicone border over primary dressing as directed. Secured With Compression Wrap Compression Stockings Add-Ons Electronic Signature(s) Signed: 05/18/2021 12:07:29 PM By: Geralyn Corwin DO Signed: 05/18/2021 5:00:25 PM By: Shawn Stall RN, BSN Entered By: Geralyn Corwin on 05/18/2021 12:02:07 -------------------------------------------------------------------------------- Multi-Disciplinary Care Plan Details Patient Name: Date of Service: [name], [name] MES B. 05/18/2021 10:15 A M Medical Record Number: [id number] Patient Account Number: [id number] Date of Birth/Sex: Treating RN: [birth year] (55  y.o. Christopher [name] Primary Care Tereasa Yilmaz: Jacquiline Doe Other Clinician: Referring Czarina Gingras: Treating Darian Cansler/Extender: Gretchen Portela in Treatment: 8 Active Inactive Wound/Skin Impairment Nursing Diagnoses: Impaired tissue integrity Knowledge deficit related to ulceration/compromised skin integrity Goals: Patient will have a decrease in wound volume by X% from date: (specify in notes) Date Initiated: 03/23/2021 Target Resolution Date: 07/09/2021 Goal Status: Active Patient/caregiver will verbalize understanding of skin care regimen Date Initiated: 03/23/2021 Target Resolution Date: 04/16/2021 Goal Status: Active Ulcer/skin breakdown will have a volume reduction of 30% by week 4 Date Initiated: 03/23/2021 Date Inactivated: 05/18/2021 Target Resolution Date: 05/18/2021 Unmet Reason: wound remains the Goal Status: Unmet same. Interventions: Assess patient/caregiver ability to obtain necessary supplies Assess patient/caregiver ability to perform ulcer/skin care regimen upon admission and as needed Assess ulceration(s) every visit Notes: Electronic Signature(s) Signed: 05/18/2021 5:00:25 PM By: Shawn Stall RN, BSN Entered By: Shawn Stall on 05/18/2021 11:06:51 -------------------------------------------------------------------------------- Pain Assessment Details Patient Name: Date of Service: Christopher [name] MES B. 05/18/2021 10:15 A M Medical Record Number: [id number] Patient Account Number: [id number] Date of Birth/Sex: Treating RN: May 04, 1966 (55 y.o. Christopher [name] Primary Care Patrycja Mumpower: Other Clinician: Jacquiline Doe Referring Allix Blomquist: Treating Owens Hara/Extender: Gretchen Portela in Treatment: 8 Active Problems Location of Pain Severity and Description of Pain Patient Has Paino Yes Site Locations Pain Location: Pain in Ulcers With Dressing Change: No Duration of the Pain. Constant / Intermittento  Constant Rate the pain. Current Pain Level: 7 Worst Pain Level: 10 Least Pain Level: 7 Tolerable Pain Level: 7 Character of Pain Describe the Pain: Difficult to Pinpoint Pain Management and Medication Current Pain Management: Medication: Yes Cold Application: No Rest: Yes Massage: No Activity: No T.E.N.S.: No Heat Application: No Leg drop or elevation: No Is the Current Pain Management Adequate: Adequate How does your wound impact your activities of daily livingo Sleep: No Bathing: No Appetite: No Relationship With Others: No Bladder Continence: No Emotions: No Bowel Continence: No Work: No Toileting: No Drive: No Dressing: No Hobbies: No Electronic Signature(s) Signed: 05/18/2021 5:02:41 PM By: Karie Schwalbe RN Entered By: Karie Schwalbe on 05/18/2021 10:33:16 -------------------------------------------------------------------------------- Patient/Caregiver Education Details Patient Name: Date of Service: Christopher [name] MES B. 12/6/2022andnbsp10:15 A M Medical Record Number: [id number] Patient Account Number: [id number] Date of Birth/Gender: Treating RN: Mar 17, 1966 (55 y.o. Christopher [name] Primary Care Physician: Jacquiline Doe Other  Clinician: Referring Physician: Treating Physician/Extender: Gretchen Portela in Treatment: 8 Education Assessment Education Provided To: Patient Education Topics Provided Wound/Skin Impairment: Handouts: Skin Care Do's and Dont's Methods: Explain/Verbal Responses: Reinforcements needed Electronic Signature(s) Signed: 05/18/2021 5:00:25 PM By: Shawn Stall RN, BSN Entered By: Shawn Stall on 05/18/2021 11:07:07 -------------------------------------------------------------------------------- Wound Assessment Details Patient Name: Date of Service: Christopher [name] MES B. 05/18/2021 10:15 A M Medical Record Number: [id number] Patient Account Number: [id number] Date of Birth/Sex: Treating RN: Aug 15, 1965  (55 y.o. Christopher [name] Primary Care Roxy Filler: Jacquiline Doe Other Clinician: Referring Janya Eveland: Treating Joyell Emami/Extender: Rulon Sera Weeks in Treatment: 8 Wound Status Wound Number: 1 Primary Etiology: Trauma, Other Wound Location: Left Trochanter Wound Status: Open Wounding Event: Trauma Comorbid History: Hypertension, Osteomyelitis Date Acquired: 07/10/2018 Weeks Of Treatment: 8 Clustered Wound: No Photos Wound Measurements Length: (cm) 0.4 Width: (cm) 1.5 Depth: (cm) 4 Area: (cm) 0.471 Volume: (cm) 1.885 % Reduction in Area: 0% % Reduction in Volume: 0% Epithelialization: None Tunneling: No Undermining: No Wound Description Classification: Full Thickness Without Exposed Support Structures Wound Margin: Distinct, outline attached Exudate Amount: Medium Exudate Type: Serosanguineous Exudate Color: red, brown Foul Odor After Cleansing: No Slough/Fibrino No Wound Bed Granulation Amount: Large (67-100%) Exposed Structure Granulation Quality: Red, Pink Fascia Exposed: No Necrotic Amount: None Present (0%) Fat Layer (Subcutaneous Tissue) Exposed: Yes Tendon Exposed: No Muscle Exposed: No Joint Exposed: No Bone Exposed: No Treatment Notes Wound #1 (Trochanter) Wound Laterality: Left Cleanser Wound Cleanser Discharge Instruction: Cleanse the wound with wound cleanser prior to applying a clean dressing using gauze sponges, not tissue or cotton balls. Peri-Wound Care Skin Prep Discharge Instruction: Use skin prep as directed Topical Primary Dressing Iodoform packing strip 1/2 (in) Discharge Instruction: Lightly pack as instructed Secondary Dressing Woven Gauze Sponge, Non-Sterile 4x4 in Discharge Instruction: Apply over primary dressing as directed. Zetuvit Plus Silicone Border Dressing 4x4 (in/in) Discharge Instruction: Apply silicone border over primary dressing as directed. Secured With Compression Wrap Compression  Stockings Facilities manager) Signed: 05/18/2021 5:02:41 PM By: Karie Schwalbe RN Entered By: Karie Schwalbe on 05/18/2021 10:47:08 -------------------------------------------------------------------------------- Vitals Details Patient Name: Date of Service: [name], [name] MES B. 05/18/2021 10:15 A M Medical Record Number: [id number] Patient Account Number: [id number] Date of Birth/Sex: Treating RN: 05-04-1966 (55 y.o. Christopher [name] Primary Care Julez Huseby: Jacquiline Doe Other Clinician: Referring Jaxie Racanelli: Treating Lizbeth Feijoo/Extender: Gretchen Portela in Treatment: 8 Vital Signs Time Taken: 10:31 Temperature (F): 97.8 Height (in): 75 Pulse (bpm): 102 Weight (lbs): 160 Respiratory Rate (breaths/min): 18 Body Mass Index (BMI): 20 Blood Pressure (mmHg): 124/80 Reference Range: 80 - 120 mg / dl Electronic Signature(s) Signed: 05/18/2021 5:02:41 PM By: Karie Schwalbe RN Entered By: Karie Schwalbe on 05/18/2021 10:31:39

## 2021-05-19 NOTE — Telephone Encounter (Signed)
Psych referral faxed to [facility]

## 2021-05-19 NOTE — Addendum Note (Signed)
Addended by: Genice Rouge on: 05/19/2021 10:59 AM   Modules accepted: Orders

## 2021-05-20 ENCOUNTER — Telehealth: Payer: Self-pay | Admitting: *Deleted

## 2021-05-20 NOTE — Telephone Encounter (Signed)
Oral swab drug screen was consistent for prescribed medications.  ?

## 2021-05-24 ENCOUNTER — Telehealth: Payer: Self-pay

## 2021-05-24 NOTE — Telephone Encounter (Signed)
Staff was not able to reach patient last week to schedule visit with [name]. Patient prefers to see [name] only. Patient accepts appointment for 12/27 virtual Mychart visit. Valarie Cones

## 2021-05-24 NOTE — Telephone Encounter (Signed)
-----   Message from Sandie Ano, RN sent at 05/11/2021 12:21 PM EST ----- Please see if patient is available for virtual appointment with [name] some time next week. Thanks! ----- Message ----- From: Veryl Speak, FNP Sent: 05/11/2021  12:20 PM EST To: Sandie Ano, RN  Likely virtual should be fine.  ----- Message ----- From: Sandie Ano, RN Sent: 05/11/2021  11:41 AM EST To: Veryl Speak, FNP  I sent in cipro and vanc for him. When do you want to see him back, and would you like in person or virtual? Thanks

## 2021-05-25 ENCOUNTER — Ambulatory Visit (INDEPENDENT_AMBULATORY_CARE_PROVIDER_SITE_OTHER): Payer: PRIVATE HEALTH INSURANCE | Admitting: Internal Medicine

## 2021-05-25 ENCOUNTER — Other Ambulatory Visit: Payer: Self-pay

## 2021-05-25 ENCOUNTER — Encounter: Payer: Self-pay | Admitting: Internal Medicine

## 2021-05-25 VITALS — BP 118/76 | HR 103 | Temp 98.0°F

## 2021-05-25 DIAGNOSIS — J9611 Chronic respiratory failure with hypoxia: Secondary | ICD-10-CM

## 2021-05-25 DIAGNOSIS — Z23 Encounter for immunization: Secondary | ICD-10-CM

## 2021-05-25 DIAGNOSIS — J690 Pneumonitis due to inhalation of food and vomit: Secondary | ICD-10-CM

## 2021-05-25 DIAGNOSIS — G709 Myoneural disorder, unspecified: Secondary | ICD-10-CM
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
## 2021-05-25 NOTE — Patient Instructions (Signed)
Please schedule follow up scheduled with myself in 6 months.  If my schedule is not open yet, we will contact you with a reminder closer to that time. Please call [phone] if you haven't heard from [place] a month before.   Before your next visit I would like you to have:  Walk test for portable oxygen concentrator.

## 2021-05-28 ENCOUNTER — Other Ambulatory Visit: Payer: Self-pay

## 2021-05-28 ENCOUNTER — Encounter: Payer: Self-pay | Admitting: Physical Medicine & Rehabilitation

## 2021-05-28 ENCOUNTER — Encounter
Payer: No Typology Code available for payment source | Attending: Physical Medicine & Rehabilitation | Admitting: Physical Medicine & Rehabilitation

## 2021-05-28 VITALS — BP 109/71 | HR 78 | Temp 98.4°F | Ht 76.0 in | Wt 170.0 lb

## 2021-05-28 DIAGNOSIS — R29898 Other symptoms and signs involving the musculoskeletal system: Secondary | ICD-10-CM | POA: Insufficient documentation
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
## 2021-05-28 NOTE — Patient Instructions (Addendum)
Your physician has ordered a Nerve Conduction Study (NCV) and/or EMG testing.  This is a test to assess the status of your nerves and muscles. For the NCV portion of the test , sticky tabs will be placed on either your hands or feet.  Your nerves will be stimulated using small electrical charges and the speed of the impulse will be measured as it travels down the nerve. For the EMG portion of the test, a small pin will be placed below the surface of the skin to measure the electrical activity in certain muscles in your arms or legs. Eating/drinking prior to testing is ok.  Please DO NOT discontinue ANY medications prior to testing  Your appointment will be scheduled by a member of our team.  You will need to check in with the main reception desk. Your test could take approximately 30 minutes to 1 hour to complete depending on the extent of the test that has been ordered. TESTING ON LEGS/BACK/HIP/FOOT AREA: Bring/wear a pair of shorts.  **ABSOLUTELY NO LOTIONS, MOISTURIZERS, VASELINE, OILS OR CREAMS OF ANY KIND ON ANY BODY PART THE DAY OF THE TEST**  **DEODORANT IS OK TO WEAR**  Please make every effort to keep your scheduled appointment time, as your physician needs the test results prior to your next appointment.  If you have to cancel or reschedule, please do so as soon as possible, so that another patient may use that testing time slot.  If you cancel your appointment, you may also need to reschedule your referring doctor's appointment until the test and results can be completed.  Please call [phone] and ask for Dr. Newton's assistant if you need to do so.  If you have any questions at any time please let us know.  We look forward to working with you!! 

## 2021-05-31 ENCOUNTER — Other Ambulatory Visit: Payer: Self-pay

## 2021-05-31 ENCOUNTER — Ambulatory Visit (INDEPENDENT_AMBULATORY_CARE_PROVIDER_SITE_OTHER): Payer: PRIVATE HEALTH INSURANCE

## 2021-05-31 DIAGNOSIS — J9611 Chronic respiratory failure with hypoxia: Secondary | ICD-10-CM | POA: Diagnosis not present

## 2021-06-02 ENCOUNTER — Other Ambulatory Visit: Payer: Self-pay

## 2021-06-02 MED ORDER — FENTANYL 75 MCG/HR TD PT72: 1.0000 | MEDICATED_PATCH | TRANSDERMAL | 0 refills | Status: DC

## 2021-06-02 MED ORDER — MORPHINE SULFATE 15 MG PO TABS: 15.0000 mg | ORAL_TABLET | ORAL | 0 refills | Status: DC | PRN

## 2021-06-02 NOTE — Telephone Encounter (Signed)
Patient calling to request refills because of holiday schedule.  PMP   Filled  Written  ID  Drug  QTY  Days  Prescriber  RX #  Dispenser  Refill  Daily Dose*  Pymt Type  PMP  05/25/2021 02/10/2021 1  Clonazepam 0.5 Mg Tablet 30.00 15 Me Lov [id number] Nor ([id number]) 4/5 2.00 LME Worker's Comp [place] 05/11/2021 05/10/2021 1  Morphine Sulfate Ir 15 Mg Tab 150.00 25 Me Lov [id number] Nor ([id number]) 0/0 90.00 MME Worker's Comp [place] 04/30/2021 04/28/2021 1  Fentanyl 75 Mcg/hr Patch 15.00 30 Me Lov [id number] Nor ([id number]) 0/0 270.00 MME Worker's Comp [place]  Last appt 05/28/2021 Next appt 10/06/2021

## 2021-06-04 ENCOUNTER — Telehealth: Payer: Self-pay

## 2021-06-04 NOTE — Telephone Encounter (Signed)
..  Home Health Verbal Orders  [facility]ency:  [facility]   Caller:Drew   Contact and title  Requesting OT/ PT/ Skilled nursing/ Social Work/ Speech:  PT  Reason for Request:  continuation of PT  Frequency:  1 week 9  HH needs F2F w/in last 30 days

## 2021-06-08 ENCOUNTER — Other Ambulatory Visit: Payer: Self-pay | Admitting: Physical Medicine and Rehabilitation

## 2021-06-08 ENCOUNTER — Encounter: Payer: Self-pay | Admitting: Family

## 2021-06-08 ENCOUNTER — Telehealth (INDEPENDENT_AMBULATORY_CARE_PROVIDER_SITE_OTHER): Payer: PRIVATE HEALTH INSURANCE | Admitting: Family

## 2021-06-08 DIAGNOSIS — M86652 Other chronic osteomyelitis, left thigh: Secondary | ICD-10-CM | POA: Diagnosis not present

## 2021-06-08 DIAGNOSIS — G894 Chronic pain syndrome: Secondary | ICD-10-CM

## 2021-06-08 DIAGNOSIS — Z9189 Other specified personal risk factors, not elsewhere classified: Secondary | ICD-10-CM | POA: Diagnosis not present

## 2021-06-08 DIAGNOSIS — K5903 Drug induced constipation: Secondary | ICD-10-CM | POA: Diagnosis not present

## 2021-06-08 DIAGNOSIS — T402X5A Adverse effect of other opioids, initial encounter: Secondary | ICD-10-CM

## 2021-06-08 MED ORDER — NALOXEGOL OXALATE 12.5 MG PO TABS: 12.5000 mg | ORAL_TABLET | Freq: Every day | ORAL | 11 refills | Status: DC

## 2021-06-08 MED ORDER — CIPROFLOXACIN HCL 500 MG PO TABS: 500.0000 mg | ORAL_TABLET | Freq: Two times a day (BID) | ORAL | 6 refills | Status: DC

## 2021-06-08 MED ORDER — VANCOMYCIN HCL 125 MG PO CAPS: 125.0000 mg | ORAL_CAPSULE | Freq: Two times a day (BID) | ORAL | 6 refills | Status: DC

## 2021-06-08 NOTE — Assessment & Plan Note (Signed)
Mr. Montanye appears to be improved since previous telehealth visit and infection seems to be suppressed at this point with ciprofloxacin. Wound waxes and wanes with drainage but does not sound to be purulent. Discussed importance of continued wound care, nutrition, and follow up with wound care. Will continue current dose of ciprofloxacin and oral vancomycin in the setting of MDR and DTR-Pseudomonas infection. Contact Palliative Care to aid in comfort. Plan for follow up in 4 months or sooner if needed.

## 2021-06-08 NOTE — Assessment & Plan Note (Signed)
Appears to be adequately controlled with Movantik and no adverse side effects. Continue current dose of Movantik.

## 2021-06-08 NOTE — Assessment & Plan Note (Signed)
Pain appears to wax and wane and currently adequately controlled and managed by Dr. Luz Lex.

## 2021-06-08 NOTE — Progress Notes (Signed)
Subjective:    Patient ID: Christopher [name], male    DOB: 03/02/1966, 55 y.o.   MRN: [id number]  Chief Complaint  Patient presents with   Osteomyelitis      Virtual Visit via Telephone/Video Note   I connected with Mr. Gartman and Mrs. Kludt on 06/08/2021 at 11:30am by video and verified that I am speaking with the correct person using two identifiers.   I discussed the limitations, risks, security and privacy concerns of performing an evaluation and management service by telephone and the availability of in person appointments. I also discussed with the patient that there may be a patient responsible charge related to this service. The patient expressed understanding and agreed to proceed.  Location:  Patient: Home in [place], [place] Provider: [facility]   HPI:  Christopher [name] is a 55 y.o. male with multiple pelvic fractures sustained from trauma complicated by hardware associated osteomyelitis status post hardware and excision of bone with cultures positive for multidrug-resistant Pseudomonas aeruginosa and maintained on ciprofloxacin and oral vancomycin since 05/18/2020.  Last telehealth visit on 01/26/2021 with worsening pain and insertion of a G-tube.  Telehealth visit today for routine follow-up.  Mr. Mcewan has been improved since insertion of his G-tube and supplemental feedings as needed.  Oral intake has increased.  He continues to take his ciprofloxacin and oral vancomycin as prescribed with no adverse side effects.  He is in good spirits today.  Family reports he still continues to have hallucinations.  Medication adjustments being made to determine if these episodes may be related to Dilaudid. Following with wound care every 2 months. Wound waxes and wanes with drainage and is without purulence. No systemic symptoms and denies fevers/chills. Remains on oxygen at present.    Allergies  Allergen Reactions   Methadone Other (See Comments)     Hallucinations/confusion      Outpatient Medications Prior to Visit  Medication Sig Dispense Refill   albuterol (VENTOLIN HFA) 108 (90 Base) MCG/ACT inhaler INHALE 2 PUFFS BY MOUTH EVERY 6 HOURS AS NEEDED FOR WHEEZE OR SHORTNESS OF BREATH (WAITING ON PA) 18 each 1   Amino Acids-Protein Hydrolys (FEEDING SUPPLEMENT, PRO-STAT 64,) LIQD Take 30 mLs by mouth 3 (three) times daily with meals. 887 mL 0   ARIPiprazole (ABILIFY) 2 MG tablet TAKE 1 TABLET (2 MG TOTAL) BY MOUTH AT BEDTIME. FOR MOOD 90 tablet 2   Blood Pressure Monitoring (BLOOD PRESSURE CUFF) MISC Use daily as needed to check blood pressure. 1 each 0   busPIRone (BUSPAR) 5 MG tablet Take 2 tablets (10 mg total) by mouth 3 (three) times daily. for anxiety     clonazePAM (KLONOPIN) 0.5 MG tablet Take 0.5 tablets (0.25 mg total) by mouth every 6 (six) hours as needed for anxiety. 60 tablet 5   dextromethorphan-guaiFENesin (MUCINEX DM) 30-600 MG 12hr tablet Take 2 tablets by mouth 2 (two) times daily.     famotidine (PEPCID) 40 MG tablet TAKE 1 TABLET BY MOUTH TWICE A DAY 180 tablet 1   fentaNYL (DURAGESIC) 75 MCG/HR Place 1 patch onto the skin every other day. 15 patch 0   gabapentin (NEURONTIN) 300 MG capsule Take 1 capsule (300 mg total) by mouth 3 (three) times daily. 90 capsule 5   HYDROmorphone (DILAUDID) 4 MG tablet Take 1-1.5 tablets (4-6 mg total) by mouth every 4 (four) hours as needed for breakthrough pain 260 tablet 0   lidocaine (LIDODERM) 5 % APPLY 3 PATCHES TO SKIN EVERY 12 HOURS AND  DISCARD WITHING 12 HOURS OR AS DIRECTED 90 patch 5   melatonin ([employer] MELATONIN) 3 MG TABS tablet Take 1 tablet (3 mg total) by mouth at bedtime. 10 tablet 0   methocarbamol (ROBAXIN) 500 MG tablet TAKE 1 TABLET BY MOUTH EVERY 6 HOURS AS NEEDED FOR MUSCLE SPASMS 100 tablet 11   mirabegron ER (MYRBETRIQ) 50 MG TB24 tablet Take 50 mg by mouth daily.     morphine (MSIR) 15 MG tablet Take 1 tablet (15 mg total) by mouth every 4 (four) hours as needed  for severe pain. 150 tablet 0   Multiple Vitamin (MULTIVITAMIN WITH MINERALS) TABS tablet Take 1 tablet by mouth daily.     naloxone (NARCAN) nasal spray 4 mg/0.1 mL To use if pt develops unconsciousness or confusion that family thinks is related to opioids. 1 each 3   oxybutynin (DITROPAN) 5 MG tablet Take 5 mg by mouth See admin instructions. Take 5 mg by mouth three times a day and an additional 5 mg once daily as needed for urinary urgency     PARoxetine (PAXIL) 40 MG tablet Take 1 tablet (40 mg total) by mouth at bedtime. 90 tablet 2   Probiotic Product (PROBIOTIC COLON SUPPORT) CAPS Take 1 capsule by mouth daily.     promethazine (PHENERGAN) 12.5 MG tablet TAKE 1 TABLET (12.5 MG TOTAL) BY MOUTH EVERY 6 (SIX) HOURS AS NEEDED FOR NAUSEA, VOMITING OR REFRACTORY NAUSEA / VOMITING. 90 tablet 5   QUEtiapine (SEROQUEL) 25 MG tablet TAKE 1-2 TABLETS (25-50 MG TOTAL) BY MOUTH 2 (TWO) TIMES DAILY. TAKE 25 MG IN AM- AND 50-100 MG NIGHTLY AS NEEDED FOR AGITATION AND CONFUSION - DO NOT EXCEED MORE THAN 4 TABLETS PER DAY 360 tablet 0   senna-docusate (SENOKOT-S) 8.6-50 MG tablet Take 2 tablets by mouth 3 (three) times daily.     traZODone (DESYREL) 100 MG tablet TAKE 1 TO 2 TABLETS AT BEDTIME 180 tablet 1   Vitamin D, Ergocalciferol, (DRISDOL) 1.25 MG (50000 UNIT) CAPS capsule Take 1 capsule (50,000 Units total) by mouth every 7 (seven) days. 5 capsule 0   ciprofloxacin (CIPRO) 500 MG tablet TAKE 1 TABLET BY MOUTH TWICE A DAY 60 tablet 2   naloxegol oxalate (MOVANTIK) 12.5 MG TABS tablet Take 1 tablet (12.5 mg total) by mouth daily. 30 tablet 11   vancomycin (VANCOCIN) 125 MG capsule Take 1 capsule (125 mg total) by mouth in the morning and at bedtime. 90 capsule 1   No facility-administered medications prior to visit.     Past Medical History:  Diagnosis Date   Acute on chronic respiratory failure with hypoxia ([place]) 06/2018   trach removed 11-16-2018, on vent from [place] until may 2020 - uses albuterol prn    Anxiety    Bacteremia due to Pseudomonas 06/2018   Chronic osteomyelitis (HCC)    Chronic pain syndrome    Clostridium difficile colitis 10/30/2019   tx with abx    Depression    DVT (deep venous thrombosis) ([place]) 2020   right brachial post PICC line   History of blood transfusion 06/2018   History of Clostridioides difficile colitis    History of kidney stones    Hypertension    norvasc d/c by pcp on 11/05/19   Multiple traumatic injuries    Penile pain 11/18/2019   Pneumonia 11/2009   2020 x 2   Walker as ambulation aid    Wheelchair bound    electric   Wound discharge    left hip  wound with bloody/clear drainage change dressing q day surgilube with gauze, between legs wound using calcium algenate pad bid     Past Surgical History:  Procedure Laterality Date   APPLICATION OF A-CELL OF BACK N/A 08/06/2018   Procedure: Application Of A-Cell Of Back;  Surgeon: [name], DO;  Location: [place];  Service: Plastics;  Laterality: N/A;   APPLICATION OF A-CELL OF EXTREMITY Left 08/06/2018   Procedure: Application Of A-Cell Of Extremity;  Surgeon: [name], DO;  Location: [facility];  Service: Plastics;  Laterality: Left;   APPLICATION OF A-CELL OF EXTREMITY Left 09/18/2019   Procedure: APPLICATION OF A-CELL OF EXTREMITY;  Surgeon: [name], DO;  Location: [facility];  Service: Plastics;  Laterality: Left;   APPLICATION OF WOUND VAC  07/12/2018   Procedure: Application Of Wound Vac to the Left Thigh and Scrotum.;  Surgeon: Shona Needles, MD;  Location: [facility];  Service: Orthopedics;;   APPLICATION OF WOUND VAC  07/10/2018   Procedure: Application Of Wound Vac;  Surgeon: Clovis Riley, MD;  Location: [place];  Service: General;;   COLON SURGERY  2020   colostomy   COLOSTOMY N/A 07/23/2018   Procedure: COLOSTOMY;  Surgeon: Georganna Skeans, MD;  Location: [facility];  Service: General;  Laterality: N/A;   CYSTOSCOPY W/ URETERAL STENT PLACEMENT N/A 07/15/2018   Procedure:  RETROGRADE URETHROGRAM;  Surgeon: Franchot Gallo, MD;  Location: [place];  Service: Urology;  Laterality: N/A;   CYSTOSCOPY WITH LITHOLAPAXY N/A 05/06/2019   Procedure: CYSTOSCOPY BASKET BLADDER STONE EXTRACTION;  Surgeon: Cleon Gustin, MD;  Location: [facility];  Service: Urology;  Laterality: N/A;  30 MINS   CYSTOSTOMY N/A 05/06/2019   Procedure: REPLACEMENT OF SUPRAPUBIC CATHETER;  Surgeon: Cleon Gustin, MD;  Location: [facility];  Service: Urology;  Laterality: N/A;   DEBRIDEMENT AND CLOSURE WOUND Left 03/04/2019   Procedure: Excision of hip wound with placement of Acell;  Surgeon: [name], DO;  Location: [place];  Service: Plastics;  Laterality: Left;   ESOPHAGOGASTRODUODENOSCOPY N/A 08/14/2018   Procedure: ESOPHAGOGASTRODUODENOSCOPY (EGD);  Surgeon: Georganna Skeans, MD;  Location: [place];  Service: General;  Laterality: N/A;  bedside   FACIAL RECONSTRUCTION SURGERY     X 2--once as a teenager and second time in his 60's   HARDWARE REMOVAL Left 03/04/2019   Procedure: Left Hip Hardware Removal;  Surgeon: Shona Needles, MD;  Location: [facility];  Service: Orthopedics;  Laterality: Left;   HIP PINNING,CANNULATED Left 07/12/2018   Procedure: CANNULATED HIP PINNING;  Surgeon: Shona Needles, MD;  Location: [place];  Service: Orthopedics;  Laterality: Left;   HIP SURGERY     HOLMIUM LASER APPLICATION Right [id number]   Procedure: HOLMIUM LASER APPLICATION;  Surgeon: Cleon Gustin, MD;  Location: [facility];  Service: Urology;  Laterality: Right;   I & D EXTREMITY Left 07/25/2018   Procedure: Debridement of buttock, scrotum and left leg, placement of acell and vac;  Surgeon: [name], DO;  Location: [place];  Service: Plastics;  Laterality: Left;   I & D EXTREMITY N/A 08/06/2018   Procedure: Debridement of buttock, scrotum and left leg;  Surgeon: [name], DO;  Location: [place];  Service: Plastics;   Laterality: N/A;   I & D EXTREMITY N/A 08/13/2018   Procedure: Debridement of buttock, scrotum and left leg, placement of acell and vac;  Surgeon: [name], DO;  Location: [place];  Service: Plastics;  Laterality: N/A;  90 min, please   INCISION AND DRAINAGE HIP Left 09/18/2019   Procedure: IRRIGATION AND DEBRIDEMENT HIP/ PELVIS WITH WOUND VAC PLACEMENT;  Surgeon: Shona Needles, MD;  Location: [place];  Service: Orthopedics;  Laterality: Left;   INCISION AND DRAINAGE OF WOUND N/A 07/18/2018   Procedure: Debridement of left leg, buttocks and scrotal wound with placement of acell and Flexiseal;  Surgeon: [name], DO;  Location: [place];  Service: Plastics;  Laterality: N/A;   INCISION AND DRAINAGE OF WOUND Left 08/29/2018   Procedure: Debridement of buttock, scrotum and left leg, placement of acell and vac;  Surgeon: [name], DO;  Location: [facility];  Service: Plastics;  Laterality: Left;  75 min, please   INCISION AND DRAINAGE OF WOUND Bilateral 10/23/2018   Procedure: DEBRIDEMENT OF BUTTOCK,SCROTUM, AND LEG WOUNDS WITH PLACEMENT OF ACELL- BILATERAL 90 MIN;  Surgeon: [name], DO;  Location: [facility];  Service: Plastics;  Laterality: Bilateral;   IR ANGIOGRAM PELVIS SELECTIVE OR SUPRASELECTIVE  07/10/2018   IR ANGIOGRAM PELVIS SELECTIVE OR SUPRASELECTIVE  07/10/2018   IR ANGIOGRAM SELECTIVE EACH ADDITIONAL VESSEL  07/10/2018   IR EMBO ART  VEN HEMORR LYMPH EXTRAV  INC GUIDE ROADMAPPING  07/10/2018   IR GASTROSTOMY TUBE MOD SED  01/21/2021   IR NEPHROSTOMY PLACEMENT LEFT  04/05/2019   IR NEPHROSTOMY PLACEMENT RIGHT  05/31/2019   IR US GUIDE BX ASP/DRAIN  07/10/2018   IR US GUIDE VASC ACCESS RIGHT  07/10/2018   IR VENO/EXT/UNI LEFT  07/10/2018   IRRIGATION AND DEBRIDEMENT OF WOUND WITH SPLIT THICKNESS SKIN GRAFT Left 09/19/2018   Procedure: Debridement of gluteal wound with placement of acell to left leg.;  Surgeon: [name], DO;  Location: [place];  Service:  Plastics;  Laterality: Left;  2.5 hours, please   LAPAROTOMY N/A 07/12/2018   Procedure: EXPLORATORY LAPAROTOMY;  Surgeon: Georganna Skeans, MD;  Location: [place];  Service: General;  Laterality: N/A;   LAPAROTOMY N/A 07/15/2018   Procedure: WOUND EXPLORATION; CLOSURE OF ABDOMEN;  Surgeon: Georganna Skeans, MD;  Location: [facility] [place];  Service: General;  Laterality: N/A;   LAPAROTOMY  07/10/2018   Procedure: Exploratory Laparotomy;  Surgeon: Clovis Riley, MD;  Location: [facility];  Service: General;;   MASS EXCISION Left 09/18/2019   Procedure: EXCISION UPPER LEFT INNER THIGH WOUND;  Surgeon: [name], DO;  Location: [place];  Service: Plastics;  Laterality: Left;   NEPHROLITHOTOMY Right 07/15/2019   Procedure: NEPHROLITHOTOMY PERCUTANEOUS;  Surgeon: Cleon Gustin, MD;  Location: [facility];  Service: Urology;  Laterality: Right;  90 MINS   PEG PLACEMENT N/A 08/14/2018   Procedure: PERCUTANEOUS ENDOSCOPIC GASTROSTOMY (PEG) PLACEMENT;  Surgeon: Georganna Skeans, MD;  Location: [facility];  Service: General;  Laterality: N/A;   PERCUTANEOUS TRACHEOSTOMY N/A 08/02/2018   Procedure: PERCUTANEOUS TRACHEOSTOMY;  Surgeon: Georganna Skeans, MD;  Location: [facility];  Service: General;  Laterality: N/A;   RADIOLOGY WITH ANESTHESIA N/A 07/10/2018   Procedure: IR WITH ANESTHESIA;  Surgeon: Sandi Mariscal, MD;  Location: [place];  Service: Radiology;  Laterality: N/A;   RADIOLOGY WITH ANESTHESIA Right 07/10/2018   Procedure: Ir With Anesthesia;  Surgeon: Sandi Mariscal, MD;  Location: [facility];  Service: Radiology;  Laterality: Right;   SCROTAL EXPLORATION N/A 07/15/2018   Procedure: SCROTUM DEBRIDEMENT;  Surgeon: Franchot Gallo, MD;  Location: [place];  Service: Urology;  Laterality: N/A;   SHOULDER  SURGERY     SKIN SPLIT GRAFT Right 09/19/2018   Procedure: Skin Graft Split Thickness;  Surgeon: [name], DO;  Location: [facility];  Service: Plastics;  Laterality: Right;   SKIN SPLIT GRAFT N/A 10/03/2018    Procedure: Split thickness skin graft to gluteal area with acell placement;  Surgeon: [name], DO;  Location: [facility] [place];  Service: Plastics;  Laterality: N/A;  3 hours, please   VACUUM ASSISTED CLOSURE CHANGE N/A 07/12/2018   Procedure: ABDOMINAL VACUUM ASSISTED CLOSURE CHANGE and abdominal washout;  Surgeon: Georganna Skeans, MD;  Location: [place];  Service: General;  Laterality: N/A;   WOUND DEBRIDEMENT Left 07/23/2018   Procedure: DEBRIDEMENT LEFT BUTTOCK  WOUND;  Surgeon: Georganna Skeans, MD;  Location: [place];  Service: General;  Laterality: Left;   WOUND EXPLORATION Left 07/10/2018   Procedure: WOUND EXPLORATION LEFT GROIN;  Surgeon: Clovis Riley, MD;  Location: [place];  Service: General;  Laterality: Left;       Review of Systems  Constitutional:  Negative for chills, diaphoresis, fatigue and fever.  Respiratory:  Negative for cough, chest tightness, shortness of breath and wheezing.   Cardiovascular:  Negative for chest pain.  Gastrointestinal:  Negative for abdominal pain, diarrhea, nausea and vomiting.  Musculoskeletal:        Positive for pelvic pain     Objective:    Nursing note and vital signs reviewed.    Mr. Vannelli is lying in bed with head of bed elevated; pleasant to speak with; nasal canula in place; physical exam limited secondary to telehealth.  Assessment & Plan:   Problem List Items Addressed This Visit       Digestive   Therapeutic opioid induced constipation    Appears to be adequately controlled with Movantik and no adverse side effects. Continue current dose of Movantik.         Musculoskeletal and Integument   Chronic osteomyelitis of pelvis, left ([place]) - Primary    Mr. Fahrenkrug appears to be improved since previous telehealth visit and infection seems to be suppressed at this point with ciprofloxacin. Wound waxes and wanes with drainage but does not sound to be purulent. Discussed importance of continued wound care, nutrition, and  follow up with wound care. Will continue current dose of ciprofloxacin and oral vancomycin in the setting of MDR and DTR-Pseudomonas infection. Contact Palliative Care to aid in comfort. Plan for follow up in 4 months or sooner if needed.       Relevant Medications   ciprofloxacin (CIPRO) 500 MG tablet   vancomycin (VANCOCIN) 125 MG capsule     Other   Chronic pain syndrome    Pain appears to wax and wane and currently adequately controlled and managed by Dr. Alveta Heimlich.      Other Visit Diagnoses     At risk for Clostridium difficile infection       Relevant Medications   vancomycin (VANCOCIN) 125 MG capsule        I have changed Anselm B. Spadafora's ciprofloxacin. I am also having him maintain his multivitamin with minerals, melatonin, Blood Pressure Cuff, mirabegron ER, oxybutynin, naloxone, dextromethorphan-guaiFENesin, Probiotic Colon Support, senna-docusate, feeding supplement (PRO-STAT 64), busPIRone, Vitamin D (Ergocalciferol), PARoxetine, methocarbamol, traZODone, albuterol, famotidine, lidocaine, ARIPiprazole, clonazePAM, HYDROmorphone, promethazine, gabapentin, fentaNYL, morphine, QUEtiapine, vancomycin, and naloxegol oxalate.   Meds ordered this encounter  Medications   ciprofloxacin (CIPRO) 500 MG tablet    Sig: Take 1 tablet (500 mg total) by mouth 2 (two) times  daily.    Dispense:  60 tablet    Refill:  6    Order Specific Question:   Supervising Provider    Answer:   Baxter Flattery, CYNTHIA [[id number]]   vancomycin (VANCOCIN) 125 MG capsule    Sig: Take 1 capsule (125 mg total) by mouth in the morning and at bedtime.    Dispense:  60 capsule    Refill:  6    Order Specific Question:   Supervising Provider    Answer:   Baxter Flattery, CYNTHIA [[id number]]   naloxegol oxalate (MOVANTIK) 12.5 MG TABS tablet    Sig: Take 1 tablet (12.5 mg total) by mouth daily.    Dispense:  30 tablet    Refill:  11    Order Specific Question:   Supervising Provider    Answer:   Carlyle Basques [phone]      I discussed the assessment and treatment plan with the patient. The patient was provided an opportunity to ask questions and all were answered. The patient agreed with the plan and demonstrated an understanding of the instructions.   The patient was advised to call back or seek an in-person evaluation if the symptoms worsen or if the condition fails to improve as anticipated.   I provided  12 minutes of non-face-to-face time during this encounter.  Follow-up: Return in about 4 months (around 10/07/2021), or if symptoms worsen or fail to improve.   Terri Piedra, MSN, FNP-C Nurse Practitioner [facility] for Infectious Disease [place] number: [phone]

## 2021-06-09 NOTE — Telephone Encounter (Signed)
Ok with me. Please place any necessary orders. 

## 2021-06-15 ENCOUNTER — Telehealth: Payer: Self-pay | Admitting: Internal Medicine

## 2021-06-17 NOTE — Telephone Encounter (Signed)
Called and spoke to pt. Pt states he knows Arline Asp and is fine with his medical records being sent to her. Called [facility] and confirmed the last OV note and walk test were the documents needed. Also confirmed fax number. Both documents have been e-faxed via Epic routing system to [place]. Cindy verbalized understanding and denied any further questions or concerns at this time.

## 2021-06-21 ENCOUNTER — Ambulatory Visit: Payer: PRIVATE HEALTH INSURANCE | Admitting: Physical Medicine and Rehabilitation

## 2021-06-23 ENCOUNTER — Other Ambulatory Visit: Payer: Self-pay

## 2021-06-23 ENCOUNTER — Encounter: Payer: No Typology Code available for payment source | Admitting: Registered Nurse

## 2021-06-23 ENCOUNTER — Emergency Department (HOSPITAL_COMMUNITY): Payer: No Typology Code available for payment source

## 2021-06-23 ENCOUNTER — Encounter (HOSPITAL_COMMUNITY): Payer: Self-pay | Admitting: Emergency Medicine

## 2021-06-23 ENCOUNTER — Emergency Department (HOSPITAL_COMMUNITY)
Admission: EM | Admit: 2021-06-23 | Discharge: 2021-06-23 | Disposition: A | Discharge status: Home / Self Care | Payer: No Typology Code available for payment source | Attending: Emergency Medicine | Admitting: Emergency Medicine

## 2021-06-23 DIAGNOSIS — Z79899 Other long term (current) drug therapy: Secondary | ICD-10-CM | POA: Diagnosis not present

## 2021-06-23 DIAGNOSIS — K5903 Drug induced constipation: Secondary | ICD-10-CM | POA: Insufficient documentation

## 2021-06-23 DIAGNOSIS — R079 Chest pain, unspecified: Secondary | ICD-10-CM | POA: Diagnosis not present

## 2021-06-23 DIAGNOSIS — J189 Pneumonia, unspecified organism: Secondary | ICD-10-CM

## 2021-06-23 DIAGNOSIS — J181 Lobar pneumonia, unspecified organism: Secondary | ICD-10-CM | POA: Insufficient documentation

## 2021-06-23 DIAGNOSIS — R109 Unspecified abdominal pain: Secondary | ICD-10-CM | POA: Diagnosis present

## 2021-06-23 DIAGNOSIS — Z7951 Long term (current) use of inhaled steroids: Secondary | ICD-10-CM | POA: Insufficient documentation

## 2021-06-23 LAB — CBC WITH DIFFERENTIAL/PLATELET
Abs Immature Granulocytes: 0.03 10*3/uL (ref 0.00–0.07)
Basophils Absolute: 0 10*3/uL (ref 0.0–0.1)
Basophils Relative: 0 %
Eosinophils Absolute: 0.4 10*3/uL (ref 0.0–0.5)
Eosinophils Relative: 3 %
HCT: 36.6 % — ABNORMAL LOW (ref 39.0–52.0)
Hemoglobin: 11.5 g/dL — ABNORMAL LOW (ref 13.0–17.0)
Immature Granulocytes: 0 %
Lymphocytes Relative: 5 %
Lymphs Abs: 0.6 10*3/uL — ABNORMAL LOW (ref 0.7–4.0)
MCH: 31.2 pg (ref 26.0–34.0)
MCHC: 31.4 g/dL (ref 30.0–36.0)
MCV: 99.2 fL (ref 80.0–100.0)
Monocytes Absolute: 0.2 10*3/uL (ref 0.1–1.0)
Monocytes Relative: 2 %
Neutro Abs: 9.6 10*3/uL — ABNORMAL HIGH (ref 1.7–7.7)
Neutrophils Relative %: 90 %
Platelets: 239 10*3/uL (ref 150–400)
RBC: 3.69 MIL/uL — ABNORMAL LOW (ref 4.22–5.81)
RDW: 12.4 % (ref 11.5–15.5)
WBC: 10.8 10*3/uL — ABNORMAL HIGH (ref 4.0–10.5)
nRBC: 0 % (ref 0.0–0.2)

## 2021-06-23 LAB — COMPREHENSIVE METABOLIC PANEL
ALT: 30 U/L (ref 0–44)
AST: 30 U/L (ref 15–41)
Albumin: 3.4 g/dL — ABNORMAL LOW (ref 3.5–5.0)
Alkaline Phosphatase: 128 U/L — ABNORMAL HIGH (ref 38–126)
Anion gap: 4 — ABNORMAL LOW (ref 5–15)
BUN: 11 mg/dL (ref 6–20)
CO2: 25 mmol/L (ref 22–32)
Calcium: 9.2 mg/dL (ref 8.9–10.3)
Chloride: 107 mmol/L (ref 98–111)
Creatinine, Ser: 0.6 mg/dL — ABNORMAL LOW (ref 0.61–1.24)
GFR, Estimated: >60 mL/min (ref >60)
Glucose, Bld: 102 mg/dL — ABNORMAL HIGH (ref 70–99)
Potassium: 3.8 mmol/L (ref 3.5–5.1)
Sodium: 136 mmol/L (ref 135–145)
Total Bilirubin: 0.5 mg/dL (ref 0.3–1.2)
Total Protein: 7.2 g/dL (ref 6.5–8.1)

## 2021-06-23 LAB — LIPASE, BLOOD: Lipase: 21 U/L (ref 11–51)

## 2021-06-23 MED ORDER — SODIUM CHLORIDE 0.9 % IV BOLUS
500.0000 mL | Freq: Once | INTRAVENOUS | Status: AC
  Administered 2021-06-23: 500 mL via INTRAVENOUS

## 2021-06-23 MED ORDER — AMOXICILLIN-POT CLAVULANATE 875-125 MG PO TABS: 1.0000 | ORAL_TABLET | Freq: Two times a day (BID) | ORAL | 0 refills | Status: DC

## 2021-06-23 MED ORDER — MORPHINE SULFATE 30 MG PO TABS
30.0000 mg | ORAL_TABLET | Freq: Once | ORAL | Status: AC
  Administered 2021-06-23: 30 mg via ORAL
  Filled 2021-06-23: qty 1

## 2021-06-23 MED ORDER — SODIUM CHLORIDE (PF) 0.9 % IJ SOLN
INTRAMUSCULAR | Status: AC
  Filled 2021-06-23: qty 50

## 2021-06-23 MED ORDER — FENTANYL CITRATE PF 50 MCG/ML IJ SOSY
100.0000 ug | PREFILLED_SYRINGE | INTRAMUSCULAR | Status: DC | PRN
  Administered 2021-06-23: 100 ug via INTRAVENOUS
  Filled 2021-06-23: qty 2

## 2021-06-23 MED ORDER — IOHEXOL 300 MG/ML  SOLN
100.0000 mL | Freq: Once | INTRAMUSCULAR | Status: AC | PRN
  Administered 2021-06-23: 100 mL via INTRAVENOUS

## 2021-06-23 NOTE — ED Notes (Signed)
PTAR called b Christopher [name] @ 15:17 06/23/2021

## 2021-06-23 NOTE — ED Provider Notes (Signed)
[facility] DEPT Provider Note   CSN: [id number] Arrival date & time: 06/23/21  [id number]     History  Chief Complaint  Patient presents with   Abdominal Pain    Christopher [name] is a 56 y.o. male.  HPI Patient with chronic pain on narcotics and ongoing constipation treated with Movantik, presenting with abdominal pain and distention.  He reports that he was doing well until this morning when he developed abdominal pain, around his feeding tube.  He had eaten shortly before this.  The pain he is having at this site radiates both to the pelvis and the chest regions.  He continues to have normal output from his ostomy.  He denies vomiting, fever, chills, cough, shortness of breath. , weakness or dizziness.  He is taking all of his usual medications and treatments.    Home Medications Prior to Admission medications   Medication Sig Start Date End Date Taking? Authorizing Provider  albuterol (VENTOLIN HFA) 108 (90 Base) MCG/ACT inhaler INHALE 2 PUFFS BY MOUTH EVERY 6 HOURS AS NEEDED FOR WHEEZE OR SHORTNESS OF BREATH (WAITING ON PA) Patient taking differently: 2 puffs every 6 (six) hours as needed for wheezing or shortness of breath. 12/22/20  Yes Vivi Barrack, MD  Amino Acids-Protein Hydrolys (FEEDING SUPPLEMENT, PRO-STAT 64,) LIQD Take 30 mLs by mouth 3 (three) times daily with meals. 07/02/20  Yes Vivi Barrack, MD  amoxicillin-clavulanate (AUGMENTIN) 875-125 MG tablet Take 1 tablet by mouth 2 (two) times daily. One po bid x 7 days 06/23/21  Yes Daleen Bo, MD  ARIPiprazole (ABILIFY) 2 MG tablet TAKE 1 TABLET (2 MG TOTAL) BY MOUTH AT BEDTIME. FOR MOOD 02/01/21  Yes Lovorn, Jinny Blossom, MD  ciprofloxacin (CIPRO) 500 MG tablet Take 1 tablet (500 mg total) by mouth 2 (two) times daily. 06/08/21  Yes Golden Circle, FNP  clonazePAM (KLONOPIN) 0.5 MG tablet Take 0.5 tablets (0.25 mg total) by mouth every 6 (six) hours as needed for anxiety. 02/10/21  Yes Lovorn,  Jinny Blossom, MD  dextromethorphan-guaiFENesin [facility] DM) 30-600 MG 12hr tablet Take 2 tablets by mouth 2 (two) times daily.   Yes [provider]  famotidine (PEPCID) 40 MG tablet TAKE 1 TABLET BY MOUTH TWICE A DAY Patient taking differently: Take 40 mg by mouth 2 (two) times daily. 01/26/21  Yes Lovorn, Jinny Blossom, MD  fentaNYL (DURAGESIC) 75 MCG/HR Place 1 patch onto the skin every other day. 06/02/21  Yes Lovorn, Jinny Blossom, MD  gabapentin (NEURONTIN) 300 MG capsule Take 1 capsule (300 mg total) by mouth 3 (three) times daily. 04/30/21  Yes Lovorn, Megan, MD  lidocaine (LIDODERM) 5 % APPLY 3 PATCHES TO SKIN EVERY 12 HOURS AND DISCARD WITHING 12 HOURS OR AS DIRECTED 01/26/21  Yes Lovorn, Jinny Blossom, MD  melatonin ([facility] MELATONIN) 3 MG TABS tablet Take 1 tablet (3 mg total) by mouth at bedtime. 11/01/19  Yes Matcha, Anupama, MD  methocarbamol (ROBAXIN) 500 MG tablet TAKE 1 TABLET BY MOUTH EVERY 6 HOURS AS NEEDED FOR MUSCLE SPASMS Patient taking differently: Take 500 mg by mouth every 6 (six) hours as needed for muscle spasms. 11/27/20  Yes Lovorn, Jinny Blossom, MD  mirabegron ER (MYRBETRIQ) 50 MG TB24 tablet Take 50 mg by mouth daily.   Yes [provider]  morphine (MSIR) 15 MG tablet Take 1 tablet (15 mg total) by mouth every 4 (four) hours as needed for severe pain. 06/02/21  Yes Lovorn, Jinny Blossom, MD  Multiple Vitamin (MULTIVITAMIN WITH MINERALS) TABS tablet Take 1  tablet by mouth daily. 03/13/19  Yes Buriev, Isaiah Serge, MD  naloxegol oxalate (MOVANTIK) 12.5 MG TABS tablet Take 1 tablet (12.5 mg total) by mouth daily. 06/08/21  Yes Veryl Speak, FNP  naloxone [facility]) nasal spray 4 mg/0.1 mL To use if pt develops unconsciousness or confusion that family thinks is related to opioids. 12/30/19  Yes Lovorn, Aundra Millet, MD  Nutritional Supplements (JEVITY 1.5 CAL PO) 237 mLs by Feeding Tube route 4 (four) times daily.   Yes [provider]  oxybutynin (DITROPAN) 5 MG tablet Take 5 mg by mouth See admin  instructions. Take 5 mg by mouth three times a day and an additional 5 mg once daily as needed for urinary urgency 11/13/19  Yes [provider]  PARoxetine (PAXIL) 40 MG tablet Take 1 tablet (40 mg total) by mouth at bedtime. 11/27/20  Yes Lovorn, Aundra Millet, MD  Probiotic Product (PROBIOTIC COLON SUPPORT) CAPS Take 1 capsule by mouth daily.   Yes [provider]  promethazine (PHENERGAN) 12.5 MG tablet TAKE 1 TABLET (12.5 MG TOTAL) BY MOUTH EVERY 6 (SIX) HOURS AS NEEDED FOR NAUSEA, VOMITING OR REFRACTORY NAUSEA / VOMITING. 04/13/21  Yes Lovorn, Megan, MD  QUEtiapine (SEROQUEL) 25 MG tablet TAKE 1-2 TABLETS (25-50 MG TOTAL) BY MOUTH 2 (TWO) TIMES DAILY. TAKE 25 MG IN AM- AND 50-100 MG NIGHTLY AS NEEDED FOR AGITATION AND CONFUSION - DO NOT EXCEED MORE THAN 4 TABLETS PER DAY Patient taking differently: Take 25-100 mg by mouth See admin instructions. 06/08/21  Yes Lovorn, Aundra Millet, MD  senna-docusate (SENOKOT-S) 8.6-50 MG tablet Take 2 tablets by mouth 3 (three) times daily. 04/13/20  Yes [provider]  traZODone (DESYREL) 100 MG tablet TAKE 1 TO 2 TABLETS AT BEDTIME Patient taking differently: Take 100-200 mg by mouth at bedtime. 12/16/20  Yes Lovorn, Aundra Millet, MD  vancomycin (VANCOCIN) 125 MG capsule Take 1 capsule (125 mg total) by mouth in the morning and at bedtime. 06/08/21  Yes Veryl Speak, FNP  Blood Pressure Monitoring (BLOOD PRESSURE CUFF) MISC Use daily as needed to check blood pressure. 11/05/19   Ardith Dark, MD      Allergies    Methadone    Review of Systems   Review of Systems  All other systems reviewed and are negative.  Physical Exam Updated Vital Signs BP 107/87    Pulse 84    Temp 98.4 F (36.9 C) (Oral)    Resp 18    Ht 6\' 3"  (1.905 m)    Wt 81.6 kg    SpO2 96%    BMI 22.50 kg/m  Physical Exam Vitals and nursing note reviewed.  Constitutional:      General: He is not in acute distress.    Appearance: He is well-developed. He is not ill-appearing or  diaphoretic.  HENT:     Head: Normocephalic and atraumatic.     Right Ear: External ear normal.     Left Ear: External ear normal.  Eyes:     Conjunctiva/sclera: Conjunctivae normal.     Pupils: Pupils are equal, round, and reactive to light.  Neck:     Trachea: Phonation normal.  Cardiovascular:     Rate and Rhythm: Normal rate and regular rhythm.  Pulmonary:     Effort: Pulmonary effort is normal.  Abdominal:     General: There is distension.     Tenderness: There is no abdominal tenderness.     Comments: Feeding tube left upper quadrant, and ostomy, left mid abdomen,  both devices in the surrounding tissue appear normal.  Musculoskeletal:        General: Normal range of motion.     Cervical back: Normal range of motion and neck supple.  Skin:    General: Skin is warm and dry.  Neurological:     Mental Status: He is alert and oriented to person, place, and time.     Cranial Nerves: No cranial nerve deficit.     Sensory: No sensory deficit.     Motor: No abnormal muscle tone.     Coordination: Coordination normal.  Psychiatric:        Mood and Affect: Mood normal.        Behavior: Behavior normal.        Thought Content: Thought content normal.        Judgment: Judgment normal.    ED Results / Procedures / Treatments   Labs (all labs ordered are listed, but only abnormal results are displayed) Labs Reviewed  COMPREHENSIVE METABOLIC PANEL - Abnormal; Notable for the following components:      Result Value   Glucose, Bld 102 (*)    Creatinine, Ser 0.60 (*)    Albumin 3.4 (*)    Alkaline Phosphatase 128 (*)    Anion gap 4 (*)    All other components within normal limits  CBC WITH DIFFERENTIAL/PLATELET - Abnormal; Notable for the following components:   WBC 10.8 (*)    RBC 3.69 (*)    Hemoglobin 11.5 (*)    HCT 36.6 (*)    Neutro Abs 9.6 (*)    Lymphs Abs 0.6 (*)    All other components within normal limits  LIPASE, BLOOD    EKG None  Radiology CT Abdomen  Pelvis W Contrast  Result Date: 06/23/2021 CLINICAL DATA:  Bowel obstruction suspected. EXAM: CT ABDOMEN AND PELVIS WITH CONTRAST TECHNIQUE: Multidetector CT imaging of the abdomen and pelvis was performed using the standard protocol following bolus administration of intravenous contrast. CONTRAST:  155mL OMNIPAQUE IOHEXOL 300 MG/ML  SOLN COMPARISON:  11/22/2020 FINDINGS: Lower chest: Trace left pleural fluid with pleural calcifications. 3 mm nodule in the right middle lobe on sequence 6, image 12 appears stable since 04/08/2020. Increased patchy parenchymal densities in the right lower lobe. Chronic consolidation along the posterior left lower lobe. Chronic pleural plaque in the anterior left upper lobe. Chronic nodularity near the left major fissure on sequence 6 image 2 that measures roughly 4 mm. Hepatobiliary: Again noted is mild intrahepatic biliary dilatation. No suspicious liver lesions. Normal appearance of the gallbladder. No significant dilatation of the common bile duct. Pancreas: Unremarkable. No pancreatic ductal dilatation or surrounding inflammatory changes. Spleen: Normal in size without focal abnormality. Adrenals/Urinary Tract: Stable 1.3 cm nodule in the right adrenal gland. This was a compatible with a benign adenoma based on my non contrast CT on 06/18/2019. Stable appearance of the left adrenal gland without gross abnormality. 6 mm low-density in the medial right kidney is too small to definitively characterize. There is also in 7 mm low-density in the mid left kidney that is too small to definitively characterize. 9 mm low-density in the left kidney is slightly dense measuring 53 Hounsfield units on this postcontrast examination. No hydronephrosis. Urinary bladder is decompressed with a suprapubic catheter. There is a bladder calculus near the right ureterovesical junction that measures 8 mm. Negative for right hydroureteronephrosis. Stomach/Bowel: Balloon retention gastrostomy tube appears  to be well positioned in the stomach. No evidence for gastric distension or  inflammatory changes. Chronic high-density material within the Hartmann's pouch. Evidence for a normal appendix. Normal appearance of small bowel without dilatation. Colon is distended and stool-filled. There is left a colostomy. Vascular/Lymphatic: Evidence for embolization coils in the right internal iliac artery region. Atherosclerotic calcifications aorta and iliac arteries without aortic aneurysm. No significant lymph node enlargement in the abdomen or pelvis. Slightly prominent lymph nodes between the aorta and inferior vena cava on sequence 2 image 48 appear stable. Reproductive: Stable appearance of the prostate. Other: Chronic soft tissue thickening in the presacral region. Negative for free air. There is focal laxity near the umbilicus which is similar to the previous examination. Musculoskeletal: Chronic deformity to the left ilium with surgical hardware in the left pelvis and proximal left femur. Old bilateral pubic rami fractures. Both hips are located. Chronic irregularity with areas of lucency and sclerosis at the right SI joint. Chronic soft tissue changes involving the lateral proximal left thigh. 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 IMPRESSION: 1. Colon is distended and contains a large amount of stool. Findings could be associated with constipation. No evidence to suggest a bowel obstruction. Gastrostomy tube is well positioned. 2. Increased patchy densities and consolidation in the right lower lobe. Findings could be related to areas of aspiration or pneumonia. Chronic consolidation and pleural thickening at the posterior left lung base. 3. Small indeterminate renal lesions. Largest lesion measures 9 mm but these lesions are all slightly dense and indeterminate. Structures have not significant changed since 11/22/2020. Recommend attention on follow-up imaging or more definitive  characterization with pre and post contrast CT or MRI. 4. Suprapubic catheter with 8 mm bladder calculus. Electronically Signed   By: Markus Daft M.D.   On: 06/23/2021 13:21    Procedures Procedures    Medications Ordered in ED Medications  fentaNYL (SUBLIMAZE) injection 100 mcg (100 mcg Intravenous Given 06/23/21 1001)  sodium chloride (PF) 0.9 % injection (has no administration in time range)  sodium chloride 0.9 % bolus 500 mL (0 mLs Intravenous Stopped 06/23/21 1327)  iohexol (OMNIPAQUE) 300 MG/ML solution 100 mL (100 mLs Intravenous Contrast Given 06/23/21 1237)  morphine (MSIR) tablet 30 mg (30 mg Oral Given 06/23/21 1417)    ED Course/ Medical Decision Making/ A&P Clinical Course as of 06/23/21 1457  Wed Jun 23, 2021  1351 Dr. Hilma Favors from palliative care is here to see the patient.  She had a message from one of the patient's medical providers that he was here, and she has been trying to connect with him so she is now talking to him. [EW]    Clinical Course User Index [EW] Daleen Bo, MD                           Medical Decision Making   Patient Vitals for the past 24 hrs:  BP Temp Temp src Pulse Resp SpO2 Height Weight  06/23/21 1400 107/87 -- -- 84 18 96 % -- --  06/23/21 1330 107/81 -- -- -- 13 -- -- --  06/23/21 1258 (!) 123/96 -- -- 84 16 95 % -- --  06/23/21 1100 116/89 -- -- 84 12 96 % -- --  06/23/21 1000 (!) 137/94 -- -- 94 14 95 % -- --  06/23/21 0938 -- -- -- -- -- -- 6\' 3"  (1.905 m) 81.6 kg  06/23/21 0936 -- -- -- -- -- 96 % -- --  06/23/21 0931 135/89  98.4 F (36.9 C) Oral 91 12 95 % -- --  06/23/21 0924 -- -- -- -- -- 96 % -- --    2:53 PM Reevaluation with update and discussion with patient and wife by telephone. After initial assessment and treatment, an updated evaluation reveals he is comfortable and eating lunch. Illness risk, progressive, discussed. Daleen Bo    Medical Decision Making: Summary of Illness/Injury: Acute abdominal pain, with  prior history of constipation and multiple intra-abdominal surgeries; presenting for postprandial pain, short duration  Critical Interventions-clinical evaluation, laboratory testing, observation reassessment; to evaluate  Chief Complaint  Patient presents with   Abdominal Pain    and assess for illness characterized as Acute, Previously Undiagnosed, Uncertain Prognosis, and Complicated   The Differential Diagnoses include bowel obstruction, constipation, complications from indwelling transabdominal devices.  I did not require  Additional Historical Information from Anyone, as the patient is a good historian.  I decided to review pertinent External Data, and in summary he is followed closely by infectious diseases, and pulmonary for ongoing problems.  These are currently stable.  He receives chronic narcotic therapy, databank reviewed.   Clinical Laboratory Tests Ordered, included CBC, Metabolic panel, and lipase . Review indicates white count high, hemoglobin low, creatinine low. Emergent testing abnormality management required for stabilization-no  Radiologic Tests Ordered, included CT abdomen pelvis.  I independently Visualized: CT abdomen pelvis images, which show primarily constipation, secondary findings for chronic problems including pulmonary changes, acute on chronic, bilateral renal lesions, and urinary bladder stone.  Cardiac Monitor Tracing which shows normal sinus rhythm, indicating stable cardiac status    This patient is Presenting for Evaluation of abdominal pain, which does require a range of treatment options, and is a complaint that involves a moderate risk of morbidity and mortality.  Pharmaceutical Risk Management parenteral narcotics and intravenous fluids Parenteral Treatment    Treatment Complication Risk Evaluation indicates discharge from ED is appropriate  After These Interventions, the Patient was reevaluated and was found with constipation as expected on  clinical examination.  No findings for acute unstable intra abdominal processes.  Incidental finding for possible acute right lower lobe pneumonia.  Ongoing problems including urinary bladder stone bilateral renal lesions.  These are amenable to outpatient follow-up and treatment.  Change in therapy now to increase stool softener to MiraLAX 3 times daily and magnesium citrate, 2 bottles over 2 days.  Recommend close observation at home with wife and follow-up with PCP and palliative care as planned.    CRITICAL CARE-no Performed by: Daleen Bo              Final Clinical Impression(s) / ED Diagnoses Final diagnoses:  Drug-induced constipation  Community acquired pneumonia of right lung, unspecified part of lung    Rx / DC Orders ED Discharge Orders          Ordered    amoxicillin-clavulanate (AUGMENTIN) 875-125 MG tablet  2 times daily        06/23/21 1450              Daleen Bo, MD 06/23/21 1457

## 2021-06-23 NOTE — Discharge Instructions (Signed)
Testing today indicates that his primary problem is constipation.  Increase the MiraLAX to 3 times a day and use magnesium citrate, 2 bottles over the next 2 days.  As the stooling improves, you can decrease the MiraLAX dosing.  The CT scan showed several findings including probably a right lower lobe pneumonia.  We are starting him on an antibiotic for that.  It also showed some left lung abnormalities which appear to be chronic.  There are again renal lesions seen which are small and nonspecific.  These could be evaluated further by her urologist.  He has a urinary bladder stone as well.  Please schedule him an appointment with his urologist within the next few weeks.  Other than that, he should continue his regular medicines, and try to eat and drink regularly.

## 2021-06-23 NOTE — ED Notes (Signed)
[facility] at bedside to transport patient 

## 2021-06-23 NOTE — ED Triage Notes (Signed)
Started having a sharp pain to abdomen last night, not getting any better. Different pain from his normal every day pain. Patient has chronic pain. Patient is rating his pain a 7 on arrival to ED. Patient states his stomach is more distended than normal.

## 2021-06-24 ENCOUNTER — Ambulatory Visit (INDEPENDENT_AMBULATORY_CARE_PROVIDER_SITE_OTHER): Payer: No Typology Code available for payment source | Admitting: Family Medicine

## 2021-06-24 ENCOUNTER — Encounter: Payer: Self-pay | Admitting: Family Medicine

## 2021-06-24 VITALS — BP 109/77 | HR 83 | Temp 99.3°F | Ht 75.0 in | Wt 170.0 lb

## 2021-06-24 DIAGNOSIS — Z933 Colostomy status: Secondary | ICD-10-CM

## 2021-06-24 DIAGNOSIS — M792 Neuralgia and neuritis, unspecified: Secondary | ICD-10-CM | POA: Diagnosis not present

## 2021-06-24 DIAGNOSIS — F064 Anxiety 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: Secondary | ICD-10-CM | POA: Diagnosis not present

## 2021-06-24 DIAGNOSIS — M8668 Other chronic osteomyelitis, other site: Secondary | ICD-10-CM

## 2021-06-24 DIAGNOSIS — G8921 Chronic pain due to trauma: Secondary | ICD-10-CM

## 2021-06-24 DIAGNOSIS — G4700 Insomnia, unspecified: Secondary | ICD-10-CM

## 2021-06-24 DIAGNOSIS — K5900 Constipation, unspecified: Secondary | ICD-10-CM

## 2021-06-24 DIAGNOSIS — F321 Major depressive disorder, single episode, moderate: Secondary | ICD-10-CM

## 2021-06-24 DIAGNOSIS — G709 Myoneural disorder, unspecified: Secondary | ICD-10-CM

## 2021-06-24 DIAGNOSIS — K5903 Drug induced constipation: Secondary | ICD-10-CM

## 2021-06-24 DIAGNOSIS — G5621 Lesion of ulnar nerve, right upper limb: Secondary | ICD-10-CM

## 2021-06-24 DIAGNOSIS — T402X5A Adverse effect of other opioids, initial encounter: Secondary | ICD-10-CM

## 2021-06-24 MED ORDER — LINACLOTIDE 145 MCG PO CAPS: 145.0000 ug | ORAL_CAPSULE | Freq: Every day | ORAL | 5 refills | Status: DC

## 2021-06-24 NOTE — Assessment & Plan Note (Signed)
EMG last month showed ulnar neuropathy.

## 2021-06-24 NOTE — Assessment & Plan Note (Signed)
Pain managed by PM&R.  His pain has been very difficult to treat as he is doing with chronic osteomyelitis of his left hip.  He has been following with infectious disease for this and is on chronic antibiotics.  Apparently infectious disease doctors are not sure how much longer of antibiotics will be effective.  He has PM&R physician had discussed going up out of care route.  Patient and his wife were agreeable to this.  Will place referral to palliative medicine for further evaluation.

## 2021-06-24 NOTE — Assessment & Plan Note (Signed)
He will be following up with psychiatry soon.  He is currently on Abilify 2 mg daily, Paxil 40 mg daily, Trazodone 100 to 200 mg nightly, and Seroquel 25 to 50 mg twice daily.

## 2021-06-24 NOTE — Assessment & Plan Note (Addendum)
Follows with infectious disease.  He is on chronic ciprofloxacin.  On vancomycin as prophylaxis against C. difficile.

## 2021-06-24 NOTE — Assessment & Plan Note (Signed)
Still having issues despite Movantik.  He has multiple attributing factors to his constipation including opioid use and several anticholinergic medications as well as wheelchair dependence.  We will switch his Movantik to Linzess.  They will continue MiraLAX and senna.  Encouraged hydration.  They will send me a message in a few weeks to let me know how this is going.  We will adjust the dose of Linzess as needed.

## 2021-06-24 NOTE — Assessment & Plan Note (Signed)
Normal output recently.  We will be treating his chronic constipation as above.

## 2021-06-24 NOTE — Assessment & Plan Note (Signed)
Continue treatment plan per psychiatry.

## 2021-06-24 NOTE — Progress Notes (Signed)
Christopher [name] is a 56 y.o. male who presents today for an office visit.  Assessment/Plan:  Chronic Problems Addressed Today: Therapeutic opioid induced constipation Still having issues despite Movantik.  He has multiple attributing factors to his constipation including opioid use and several anticholinergic medications as well as wheelchair dependence.  We will switch his Movantik to Linzess.  They will continue MiraLAX and senna.  Encouraged hydration.  They will send me a message in a few weeks to let me know how this is going.  We will adjust the dose of Linzess as needed.  Chronic pain due to trauma Pain managed by PM&R.  His pain has been very difficult to treat as he is doing with chronic osteomyelitis of his left hip.  He has been following with infectious disease for this and is on chronic antibiotics.  Apparently infectious disease doctors are not sure how much longer of antibiotics will be effective.  He has PM&R physician had discussed going up out of care route.  Patient and his wife were agreeable to this.  Will place referral to palliative medicine for further evaluation.  Depression, major, single episode, moderate (HCC) He will be following up with psychiatry soon.  He is currently on Abilify 2 mg daily, Paxil 40 mg daily, Trazodone 100 to 200 mg nightly, and Seroquel 25 to 50 mg twice daily.  Neuromuscular weakness (HCC) Recently had nerve conduction study done which showed ulnar neuropathy.  He will discuss with his PM&R physician about neck steps.  May need referral to orthopedics.  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  Chronic osteomyelitis of hip (HCC) Follows with infectious disease.  He is on chronic ciprofloxacin.  On vancomycin as prophylaxis against C. difficile.  Anxiety 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 He will be following up with psychiatry soon.  Continue current treatment plan for  now.  Insomnia Continue treatment plan per psychiatry.   Colostomy status (HCC) Normal output recently.  We will be treating his chronic constipation as above.     Subjective:  HPI:  He is here to follow up from ED. He presented to the ED with abdominal pain on 06/23/2020. He was having pain around his feeding tube. Had work up in the ED which showed several findings. Had CT scan which shows he was constipated. He was found to have bilateral renal lesions as well as urinary bladder stone. CT scan also showed right lower lobe pneumonia. He was started on antibiotic for this. He was given Miralax 3 times daily for constipation.  He has also been taking Movantik for opioid-induced constipation.  This has been an ongoing issue.  He has not had much success thus far.  No side effects with treatment plan.  He has not been doing well since being discharged from ED his still having a significant amount of pain.  PMNR  has helped with management for his pain which has been recently well controlled until his ED visit yesterday.  There have been discussions about palliative care given his multiple chronic comorbidities with guarded prognosis.  Patient and his wife are agreeable to pursue this further today.    His chronic pain radiate from lower back to hip. He is taking Miralax 2 times daily and Movantik for constipation. His wife notes she is not sure if this helping.  He has not been sleeping well. He is hearing some illusion noises. Has had decreased appetite.  He notes he is  having some pain when he eat. Also losing some weight.        Objective:  Physical Exam: BP 109/77    Pulse 83    Temp 99.3 F (37.4 C) (Temporal)    Ht 6\' 3"  (1.905 m)    Wt 170 lb (77.1 kg)    SpO2 94%    BMI 21.25 kg/m   Gen: No acute distress, resting comfortably CV: Regular rate and rhythm with no murmurs appreciated Pulm: Normal work of breathing, clear to auscultation bilaterally with no crackles, wheezes, or  rhonchi Psych: Normal affect and thought content       I,Savera Zaman,acting as a scribe for , MD.,have documented all relevant documentation on the behalf of Jacquiline Doe, MD,as directed by  Jacquiline Doe, MD while in the presence of Jacquiline Doe, MD.   I, Jacquiline Doe, MD, have reviewed all documentation for this visit. The documentation on 06/24/21 for the exam, diagnosis, procedures, and orders are all accurate and complete.  Time Spent: 50 minutes of total time was spent on the date of the encounter performing the following actions: chart review prior to seeing the patient including recent ED visit and visits with specialists, obtaining history, performing a medically necessary exam, counseling on the treatment plan, placing orders, and documenting in our EHR.   08/22/21. Katina Degree, MD 06/24/2021 12:44 PM

## 2021-06-24 NOTE — Assessment & Plan Note (Signed)
Recently had nerve conduction study done which showed ulnar neuropathy.  He will discuss with his PM&R physician about neck steps.  May need referral to orthopedics.

## 2021-06-24 NOTE — Patient Instructions (Signed)
It was very nice to see you today!  Supplementing.  Start Linzess.  Send me a message in a couple weeks to let me know how this is working with the constipation.  I will refer you to see palliative care.  Please discuss your EMG results with Dr Berline Chough.  You may need to have a referral to see orthopedic surgeon.  I will see back in 3 months.  Come back sooner if needed.  Take care, Dr [name]  PLEASE NOTE:  If you had any lab tests please let us know if you have not heard back within a few days. You may see your results on mychart before we have a chance to review them but we will give you a call once they are reviewed by [place]. If we ordered any referrals today, please let us know if you have not heard from their office within the next week.   Please try these tips to maintain a healthy lifestyle:  Eat at least 3 REAL meals and 1-2 snacks per day.  Aim for no more than 5 hours between eating.  If you eat breakfast, please do so within one hour of getting up.   Each meal should contain half fruits/vegetables, one quarter protein, and one quarter carbs (no bigger than a computer mouse)  Cut down on sweet beverages. This includes juice, soda, and sweet tea.   Drink at least 1 glass of water with each meal and aim for at least 8 glasses per day  Exercise at least 150 minutes every week.

## 2021-06-24 NOTE — Assessment & Plan Note (Signed)
He will be following up with psychiatry soon.  Continue current treatment plan for now.

## 2021-07-02 ENCOUNTER — Telehealth: Payer: Self-pay | Admitting: Student

## 2021-07-02 NOTE — Telephone Encounter (Signed)
Spoke with patient to let him know that I was calling about the Palliative referral and he put his wife on the phone.  Spoke with wife and discussed the referral/services with her and she was in agreement with scheduling visit.  I have scheduled a Telemedicine Palliative Consult for 07/12/21 @ 11 AM.

## 2021-07-05 ENCOUNTER — Encounter: Payer: Self-pay | Admitting: Registered Nurse

## 2021-07-05 ENCOUNTER — Other Ambulatory Visit: Payer: Self-pay

## 2021-07-05 ENCOUNTER — Encounter
Payer: No Typology Code available for payment source | Attending: Physical Medicine and Rehabilitation | Admitting: Registered Nurse

## 2021-07-05 VITALS — BP 132/89 | HR 108 | Temp 98.2°F | Ht 75.0 in

## 2021-07-05 DIAGNOSIS — E43 Unspecified severe protein-calorie malnutrition: Secondary | ICD-10-CM | POA: Diagnosis present

## 2021-07-05 DIAGNOSIS — Z993 Dependence on wheelchair: Secondary | ICD-10-CM | POA: Diagnosis present

## 2021-07-05 DIAGNOSIS — F329 Major depressive disorder, single episode, unspecified: Secondary | ICD-10-CM | POA: Diagnosis present

## 2021-07-05 DIAGNOSIS — M8668 Other chronic osteomyelitis, other site: Secondary | ICD-10-CM | POA: Diagnosis present

## 2021-07-05 DIAGNOSIS — R29898 Other symptoms and signs involving the musculoskeletal system: Secondary | ICD-10-CM | POA: Insufficient documentation

## 2021-07-05 DIAGNOSIS — Z5181 Encounter for therapeutic drug level monitoring: Secondary | ICD-10-CM | POA: Diagnosis present

## 2021-07-05 DIAGNOSIS — G894 Chronic pain syndrome: Secondary | ICD-10-CM | POA: Diagnosis present

## 2021-07-05 MED ORDER — MORPHINE SULFATE 15 MG PO TABS: 15.0000 mg | ORAL_TABLET | ORAL | 0 refills | Status: DC | PRN

## 2021-07-05 MED ORDER — FENTANYL 75 MCG/HR TD PT72: 1.0000 | MEDICATED_PATCH | TRANSDERMAL | 0 refills | Status: DC

## 2021-07-05 NOTE — Patient Instructions (Signed)
After Pallative Care Visit on 07/12/2021, send Dr Berline Chough a My-Chart Message.   Only one prescription waw sent awaiting on input from Palliative Care

## 2021-07-05 NOTE — Progress Notes (Signed)
Subjective:    Patient ID: Christopher [name], male    DOB: Mar 10, 1966, 56 y.o.   MRN: [id number]  HPI: Christopher [name] is a 56 y.o. male who returns for follow up appointment for chronic pain and medication refill. Medical History: multiple traumatic injuries, multiple fractures of pelvis with unstable disruption of pelvic ring, injury of urethra, drainage from wound, anxiety/depression, debility and protein- calorie malnutrition. He has Chronic Osteomyelitis: ID Following.  He has a colostomy with liquid brown stool .  He has a Peg- Tube: Being used PRN depending on his oral intake. He reports he has a good appetite.  Christopher [name] states he has pain in his lower back and left hip pain. He also reports pain in his right hand. Christopher [name] is receiving physcal and occupational therapy weekly.   He arrived in wheelchair.  He rates his pain 9.   Christopher [name] to [facility] on 06/23/2021 for abdominal pain  and  constipation, note was reviewed.  Constipation resolved.   Christopher [name] Morphine equivalent is 273.00  MME. He  is also prescribed Clonazepam .We have discussed the black box warning of using opioids and benzodiazepines. I highlighted the dangers of using these drugs together and discussed the adverse events including respiratory suppression, overdose, cognitive impairment and importance of compliance with current regimen. We will continue to monitor and adjust as indicated.   Last Oral Swab was Performed on 05/10/2021, it was consistent.   Christopher [name] has an appointment with Palliative Care on  07/12/2021, his medication will remain the same at this time, Dr Dagoberto Ligas awaiting Pallitative Care input regarding pain medication management. He verbalizes understanding.     Pain Inventory Average Pain 9 Pain Right Now 9 My pain is sharp, burning, dull, stabbing, tingling, and aching  In the last 24 hours, has pain interfered with the following? General activity  9 Relation with others 10 Enjoyment of life 10 What TIME of day is your pain at its worst? morning , daytime, evening, and night Sleep (in general) Fair  Pain is worse with: walking, bending, sitting, inactivity, standing, unsure, and some activites Pain improves with: rest, heat/ice, therapy/exercise, pacing activities, medication, TENS, and injections Relief from Meds: 5  Family History  Problem Relation Age of Onset   Breast cancer Mother        with mets to the bones   Social History   Socioeconomic History   Marital status: Married    Spouse name: Not on file   Number of children: Not on file   Years of education: Not on file   Highest education level: Not on file  Occupational History   Occupation: Disable  Tobacco Use   Smoking status: Former    Packs/day: 1.00    Years: 20.00    Pack years: 20.00    Types: Cigarettes    Quit date: 07/10/2018    Years since quitting: 2.9   Smokeless tobacco: Never  Vaping Use   Vaping Use: Never used  Substance and Sexual Activity   Alcohol use: Never   Drug use: Yes    Types: Oxycodone, Fentanyl    Comment: Fentanyl patch/oxycodone since 06/2018   Sexual activity: Yes  Other Topics Concern   Not on file  Social History Narrative   ** Merged History Encounter **       Social Determinants of Health   Financial Resource Strain: Not on file  Food Insecurity: Not on file  Transportation Needs: Not on  file  Physical Activity: Not on file  Stress: Not on file  Social Connections: Not on file   Past Surgical History:  Procedure Laterality Date   APPLICATION OF A-CELL OF BACK N/A 08/06/2018   Procedure: Application Of A-Cell Of Back;  Surgeon: [name], DO;  Location: [place];  Service: Plastics;  Laterality: N/A;   APPLICATION OF A-CELL OF EXTREMITY Left 08/06/2018   Procedure: Application Of A-Cell Of Extremity;  Surgeon: [name], DO;  Location: [place];  Service: Plastics;  Laterality: Left;    APPLICATION OF A-CELL OF EXTREMITY Left 09/18/2019   Procedure: APPLICATION OF A-CELL OF EXTREMITY;  Surgeon: [name], DO;  Location: [place];  Service: Plastics;  Laterality: Left;   APPLICATION OF WOUND VAC  07/12/2018   Procedure: Application Of Wound Vac to the Left Thigh and Scrotum.;  Surgeon: Shona Needles, MD;  Location: [place];  Service: Orthopedics;;   APPLICATION OF WOUND VAC  07/10/2018   Procedure: Application Of Wound Vac;  Surgeon: Clovis Riley, MD;  Location: [place];  Service: General;;   COLON SURGERY  2020   colostomy   COLOSTOMY N/A 07/23/2018   Procedure: COLOSTOMY;  Surgeon: Georganna Skeans, MD;  Location: [place];  Service: General;  Laterality: N/A;   CYSTOSCOPY W/ URETERAL STENT PLACEMENT N/A 07/15/2018   Procedure: RETROGRADE URETHROGRAM;  Surgeon: Franchot Gallo, MD;  Location: [place];  Service: Urology;  Laterality: N/A;   CYSTOSCOPY WITH LITHOLAPAXY N/A 05/06/2019   Procedure: CYSTOSCOPY BASKET BLADDER STONE EXTRACTION;  Surgeon: Cleon Gustin, MD;  Location: [facility];  Service: Urology;  Laterality: N/A;  30 MINS   CYSTOSTOMY N/A 05/06/2019   Procedure: REPLACEMENT OF SUPRAPUBIC CATHETER;  Surgeon: Cleon Gustin, MD;  Location: [facility];  Service: Urology;  Laterality: N/A;   DEBRIDEMENT AND CLOSURE WOUND Left 03/04/2019   Procedure: Excision of hip wound with placement of Acell;  Surgeon: [name], DO;  Location: [place];  Service: Plastics;  Laterality: Left;   ESOPHAGOGASTRODUODENOSCOPY N/A 08/14/2018   Procedure: ESOPHAGOGASTRODUODENOSCOPY (EGD);  Surgeon: Georganna Skeans, MD;  Location: [place];  Service: General;  Laterality: N/A;  bedside   FACIAL RECONSTRUCTION SURGERY     X 2--once as a teenager and second time in his 50's   HARDWARE REMOVAL Left 03/04/2019   Procedure: Left Hip Hardware Removal;  Surgeon: Shona Needles, MD;  Location: [place];  Service: Orthopedics;  Laterality: Left;    HIP PINNING,CANNULATED Left 07/12/2018   Procedure: CANNULATED HIP PINNING;  Surgeon: Shona Needles, MD;  Location: [place];  Service: Orthopedics;  Laterality: Left;   HIP SURGERY     HOLMIUM LASER APPLICATION Right [id number]   Procedure: HOLMIUM LASER APPLICATION;  Surgeon: Cleon Gustin, MD;  Location: [facility];  Service: Urology;  Laterality: Right;   I & D EXTREMITY Left 07/25/2018   Procedure: Debridement of buttock, scrotum and left leg, placement of acell and vac;  Surgeon: [name], DO;  Location: [place];  Service: Plastics;  Laterality: Left;   I & D EXTREMITY N/A 08/06/2018   Procedure: Debridement of buttock, scrotum and left leg;  Surgeon: [name], DO;  Location: [place];  Service: Plastics;  Laterality: N/A;   I & D EXTREMITY N/A 08/13/2018   Procedure: Debridement of buttock, scrotum and left leg, placement of acell and vac;  Surgeon: [name], DO;  Location: [place];  Service: Government social research officer;  Laterality: N/A;  90 min, please   INCISION AND DRAINAGE HIP Left 09/18/2019   Procedure: IRRIGATION AND DEBRIDEMENT HIP/ PELVIS WITH WOUND VAC PLACEMENT;  Surgeon: Roby Lofts, MD;  [facility];  Service: Orthopedics;  Laterality: Left;   INCISION AND DRAINAGE OF WOUND N/A 07/18/2018   Procedure: Debridement of left leg, buttocks and scrotal wound with placement of acell and Flexiseal;  Surgeon: Peggye Form, DO;  [facility];  Service: Plastics;  Laterality: N/A;   INCISION AND DRAINAGE OF WOUND Left 08/29/2018   Procedure: Debridement of buttock, scrotum and left leg, placement of acell and vac;  Surgeon: Peggye Form, DO;  [facility];  Service: Plastics;  Laterality: Left;  75 min, please   INCISION AND DRAINAGE OF WOUND Bilateral 10/23/2018   Procedure: DEBRIDEMENT OF BUTTOCK,SCROTUM, AND LEG WOUNDS WITH PLACEMENT OF ACELL- BILATERAL 90 MIN;  Surgeon: Peggye Form, DO;  [facility];  Service: Plastics;   Laterality: Bilateral;   IR ANGIOGRAM PELVIS SELECTIVE OR SUPRASELECTIVE  07/10/2018   IR ANGIOGRAM PELVIS SELECTIVE OR SUPRASELECTIVE  07/10/2018   IR ANGIOGRAM SELECTIVE EACH ADDITIONAL VESSEL  07/10/2018   IR EMBO ART  VEN HEMORR LYMPH EXTRAV  INC GUIDE ROADMAPPING  07/10/2018   IR GASTROSTOMY TUBE MOD SED  01/21/2021   IR NEPHROSTOMY PLACEMENT LEFT  04/05/2019   IR NEPHROSTOMY PLACEMENT RIGHT  05/31/2019   IR US GUIDE BX ASP/DRAIN  07/10/2018   IR US GUIDE VASC ACCESS RIGHT  07/10/2018   IR VENO/EXT/UNI LEFT  07/10/2018   IRRIGATION AND DEBRIDEMENT OF WOUND WITH SPLIT THICKNESS SKIN GRAFT Left 09/19/2018   Procedure: Debridement of gluteal wound with placement of acell to left leg.;  Surgeon: Peggye Form, DO;  [facility];  Service: Plastics;  Laterality: Left;  2.5 hours, please   LAPAROTOMY N/A 07/12/2018   Procedure: EXPLORATORY LAPAROTOMY;  Surgeon: Violeta Gelinas, MD;  Location[facility];  Service: General;  Laterality: N/A;   LAPAROTOMY N/A 07/15/2018   Procedure: WOUND EXPLORATION; CLOSURE OF ABDOMEN;  Surgeon: Violeta Gelinas, MD;  Location: [facility] OR;  Service: General;  Laterality: N/A;   LAPAROTOMY  07/10/2018   Procedure: Exploratory Laparotomy;  Surgeon: Berna Bue, MD;  Location: [facility] OR;  Service: General;;   MASS EXCISION Left 09/18/2019   Procedure: EXCISION UPPER LEFT INNER THIGH WOUND;  Surgeon: Peggye Form, DO;  [facility];  Service: Plastics;  Laterality: Left;   NEPHROLITHOTOMY Right 07/15/2019   Procedure: NEPHROLITHOTOMY PERCUTANEOUS;  Surgeon: Malen Gauze, MD;  Location: [facility];  Service: Urology;  Laterality: Right;  90 MINS   PEG PLACEMENT N/A 08/14/2018   Procedure: PERCUTANEOUS ENDOSCOPIC GASTROSTOMY (PEG) PLACEMENT;  Surgeon: Violeta Gelinas, MD;  Location: [facility] END[facility]COPY;  Service: General;  Laterality: N/A;   PERCUTANEOUS TRACHEOSTOMY N/A 08/02/2018   Procedure: PERCUTANEOUS TRACHEOSTOMY;  Surgeon: Violeta Gelinas, MD;   Location: [facility] OR;  Service: General;  Laterality: N/A;   RADIOLOGY WITH ANESTHESIA N/A 07/10/2018   Procedure: IR WITH ANESTHESIA;  Surgeon: Simonne Come, MD;  Location: [facility] OR;  Service: Radiology;  Laterality: N/A;   RADIOLOGY WITH ANESTHESIA Right 07/10/2018   Procedure: Ir With Anesthesia;  Surgeon: Simonne Come, MD;  Location: [facility] OR;  Service: Radiology;  Laterality: Right;   SCROTAL EXPLORATION N/A 07/15/2018   Procedure: SCROTUM DEBRIDEMENT;  Surgeon: Marcine Matar, MD;  Location: [facility] OR;  Service: Urology;  Laterality: N/A;   SHOULDER SURGERY  SKIN SPLIT GRAFT Right 09/19/2018   Procedure: Skin Graft Split Thickness;  Surgeon: [name], DO;  Location: [place];  Service: Plastics;  Laterality: Right;   SKIN SPLIT GRAFT N/A 10/03/2018   Procedure: Split thickness skin graft to gluteal area with acell placement;  Surgeon: [name], DO;  Location: [place];  Service: Plastics;  Laterality: N/A;  3 hours, please   VACUUM ASSISTED CLOSURE CHANGE N/A 07/12/2018   Procedure: ABDOMINAL VACUUM ASSISTED CLOSURE CHANGE and abdominal washout;  Surgeon: Georganna Skeans, MD;  Location: [place];  Service: General;  Laterality: N/A;   WOUND DEBRIDEMENT Left 07/23/2018   Procedure: DEBRIDEMENT LEFT BUTTOCK  WOUND;  Surgeon: Georganna Skeans, MD;  Location: [place];  Service: General;  Laterality: Left;   WOUND EXPLORATION Left 07/10/2018   Procedure: WOUND EXPLORATION LEFT GROIN;  Surgeon: Clovis Riley, MD;  Location: [place];  Service: General;  Laterality: Left;   Past Surgical History:  Procedure Laterality Date   APPLICATION OF A-CELL OF BACK N/A 08/06/2018   Procedure: Application Of A-Cell Of Back;  Surgeon: [name], DO;  Location: [place];  Service: Plastics;  Laterality: N/A;   APPLICATION OF A-CELL OF EXTREMITY Left 08/06/2018   Procedure: Application Of A-Cell Of Extremity;  Surgeon: [name], DO;  Location: [place];  Service: Plastics;  Laterality: Left;    APPLICATION OF A-CELL OF EXTREMITY Left 09/18/2019   Procedure: APPLICATION OF A-CELL OF EXTREMITY;  Surgeon: [name], DO;  Location: [place];  Service: Plastics;  Laterality: Left;   APPLICATION OF WOUND VAC  07/12/2018   Procedure: Application Of Wound Vac to the Left Thigh and Scrotum.;  Surgeon: Shona Needles, MD;  Location: [place];  Service: Orthopedics;;   APPLICATION OF WOUND VAC  07/10/2018   Procedure: Application Of Wound Vac;  Surgeon: Clovis Riley, MD;  Location: [place];  Service: General;;   COLON SURGERY  2020   colostomy   COLOSTOMY N/A 07/23/2018   Procedure: COLOSTOMY;  Surgeon: Georganna Skeans, MD;  Location: [place];  Service: General;  Laterality: N/A;   CYSTOSCOPY W/ URETERAL STENT PLACEMENT N/A 07/15/2018   Procedure: RETROGRADE URETHROGRAM;  Surgeon: Franchot Gallo, MD;  Location: [place];  Service: Urology;  Laterality: N/A;   CYSTOSCOPY WITH LITHOLAPAXY N/A 05/06/2019   Procedure: CYSTOSCOPY BASKET BLADDER STONE EXTRACTION;  Surgeon: Cleon Gustin, MD;  Location: [facility];  Service: Urology;  Laterality: N/A;  30 MINS   CYSTOSTOMY N/A 05/06/2019   Procedure: REPLACEMENT OF SUPRAPUBIC CATHETER;  Surgeon: Cleon Gustin, MD;  Location: [facility];  Service: Urology;  Laterality: N/A;   DEBRIDEMENT AND CLOSURE WOUND Left 03/04/2019   Procedure: Excision of hip wound with placement of Acell;  Surgeon: [name], DO;  Location: [place];  Service: Plastics;  Laterality: Left;   ESOPHAGOGASTRODUODENOSCOPY N/A 08/14/2018   Procedure: ESOPHAGOGASTRODUODENOSCOPY (EGD);  Surgeon: Georganna Skeans, MD;  Location: [place];  Service: General;  Laterality: N/A;  bedside   FACIAL RECONSTRUCTION SURGERY     X 2--once as a teenager and second time in his 17's   HARDWARE REMOVAL Left 03/04/2019   Procedure: Left Hip Hardware Removal;  Surgeon: Shona Needles, MD;  Location: [place];  Service: Orthopedics;  Laterality: Left;    HIP PINNING,CANNULATED Left 07/12/2018   Procedure: CANNULATED HIP PINNING;  Surgeon: Shona Needles, MD;  Location: [place];  Service: Orthopedics;  Laterality: Left;  HIP SURGERY     HOLMIUM LASER APPLICATION Right Q000111Q   Procedure: HOLMIUM LASER APPLICATION;  Surgeon: Cleon Gustin, MD;  Location: [facility];  Service: Urology;  Laterality: Right;   I & D EXTREMITY Left 07/25/2018   Procedure: Debridement of buttock, scrotum and left leg, placement of acell and vac;  Surgeon: [name], DO;  Location: [place];  Service: Plastics;  Laterality: Left;   I & D EXTREMITY N/A 08/06/2018   Procedure: Debridement of buttock, scrotum and left leg;  Surgeon: [name], DO;  Location: [place];  Service: Plastics;  Laterality: N/A;   I & D EXTREMITY N/A 08/13/2018   Procedure: Debridement of buttock, scrotum and left leg, placement of acell and vac;  Surgeon: [name], DO;  Location: [place];  Service: Plastics;  Laterality: N/A;  90 min, please   INCISION AND DRAINAGE HIP Left 09/18/2019   Procedure: IRRIGATION AND DEBRIDEMENT HIP/ PELVIS WITH WOUND VAC PLACEMENT;  Surgeon: Shona Needles, MD;  Location: [place];  Service: Orthopedics;  Laterality: Left;   INCISION AND DRAINAGE OF WOUND N/A 07/18/2018   Procedure: Debridement of left leg, buttocks and scrotal wound with placement of acell and Flexiseal;  Surgeon: [name], DO;  Location: [place];  Service: Plastics;  Laterality: N/A;   INCISION AND DRAINAGE OF WOUND Left 08/29/2018   Procedure: Debridement of buttock, scrotum and left leg, placement of acell and vac;  Surgeon: [name], DO;  Location: [place];  Service: Plastics;  Laterality: Left;  75 min, please   INCISION AND DRAINAGE OF WOUND Bilateral 10/23/2018   Procedure: DEBRIDEMENT OF BUTTOCK,SCROTUM, AND LEG WOUNDS WITH PLACEMENT OF ACELL- BILATERAL 90 MIN;  Surgeon: [name], DO;  Location: [place];  Service: Plastics;   Laterality: Bilateral;   IR ANGIOGRAM PELVIS SELECTIVE OR SUPRASELECTIVE  07/10/2018   IR ANGIOGRAM PELVIS SELECTIVE OR SUPRASELECTIVE  07/10/2018   IR ANGIOGRAM SELECTIVE EACH ADDITIONAL VESSEL  07/10/2018   IR EMBO ART  VEN HEMORR LYMPH EXTRAV  INC GUIDE ROADMAPPING  07/10/2018   IR GASTROSTOMY TUBE MOD SED  01/21/2021   IR NEPHROSTOMY PLACEMENT LEFT  04/05/2019   IR NEPHROSTOMY PLACEMENT RIGHT  05/31/2019   IR US GUIDE BX ASP/DRAIN  07/10/2018   IR US GUIDE VASC ACCESS RIGHT  07/10/2018   IR VENO/EXT/UNI LEFT  07/10/2018   IRRIGATION AND DEBRIDEMENT OF WOUND WITH SPLIT THICKNESS SKIN GRAFT Left 09/19/2018   Procedure: Debridement of gluteal wound with placement of acell to left leg.;  Surgeon: [name], DO;  Location: [place];  Service: Plastics;  Laterality: Left;  2.5 hours, please   LAPAROTOMY N/A 07/12/2018   Procedure: EXPLORATORY LAPAROTOMY;  Surgeon: Georganna Skeans, MD;  Location: [place];  Service: General;  Laterality: N/A;   LAPAROTOMY N/A 07/15/2018   Procedure: WOUND EXPLORATION; CLOSURE OF ABDOMEN;  Surgeon: Georganna Skeans, MD;  Location: [place];  Service: General;  Laterality: N/A;   LAPAROTOMY  07/10/2018   Procedure: Exploratory Laparotomy;  Surgeon: Clovis Riley, MD;  Location: [place];  Service: General;;   MASS EXCISION Left 09/18/2019   Procedure: EXCISION UPPER LEFT INNER THIGH WOUND;  Surgeon: [name], DO;  Location: [place];  Service: Plastics;  Laterality: Left;   NEPHROLITHOTOMY Right 07/15/2019   Procedure: NEPHROLITHOTOMY PERCUTANEOUS;  Surgeon: Cleon Gustin, MD;  Location: [facility];  Service: Urology;  Laterality: Right;  90 MINS  PEG PLACEMENT N/A 08/14/2018   Procedure: PERCUTANEOUS ENDOSCOPIC GASTROSTOMY (PEG) PLACEMENT;  Surgeon: Georganna Skeans, MD;  Location: [place];  Service: General;  Laterality: N/A;   PERCUTANEOUS TRACHEOSTOMY N/A 08/02/2018   Procedure: PERCUTANEOUS TRACHEOSTOMY;  Surgeon: Georganna Skeans, MD;   Location: [place];  Service: General;  Laterality: N/A;   RADIOLOGY WITH ANESTHESIA N/A 07/10/2018   Procedure: IR WITH ANESTHESIA;  Surgeon: Sandi Mariscal, MD;  Location: [place];  Service: Radiology;  Laterality: N/A;   RADIOLOGY WITH ANESTHESIA Right 07/10/2018   Procedure: Ir With Anesthesia;  Surgeon: Sandi Mariscal, MD;  Location: [place];  Service: Radiology;  Laterality: Right;   SCROTAL EXPLORATION N/A 07/15/2018   Procedure: SCROTUM DEBRIDEMENT;  Surgeon: Franchot Gallo, MD;  Location: [place];  Service: Urology;  Laterality: N/A;   SHOULDER SURGERY     SKIN SPLIT GRAFT Right 09/19/2018   Procedure: Skin Graft Split Thickness;  Surgeon: [name], DO;  Location: [place];  Service: Plastics;  Laterality: Right;   SKIN SPLIT GRAFT N/A 10/03/2018   Procedure: Split thickness skin graft to gluteal area with acell placement;  Surgeon: [name], DO;  Location: [place];  Service: Plastics;  Laterality: N/A;  3 hours, please   VACUUM ASSISTED CLOSURE CHANGE N/A 07/12/2018   Procedure: ABDOMINAL VACUUM ASSISTED CLOSURE CHANGE and abdominal washout;  Surgeon: Georganna Skeans, MD;  Location: [place];  Service: General;  Laterality: N/A;   WOUND DEBRIDEMENT Left 07/23/2018   Procedure: DEBRIDEMENT LEFT BUTTOCK  WOUND;  Surgeon: Georganna Skeans, MD;  Location: [place];  Service: General;  Laterality: Left;   WOUND EXPLORATION Left 07/10/2018   Procedure: WOUND EXPLORATION LEFT GROIN;  Surgeon: Clovis Riley, MD;  Location: [place] [facility];  Service: General;  Laterality: Left;   Past Medical History:  Diagnosis Date   Acute on chronic respiratory failure with hypoxia ([place]) 06/2018   trach removed 11-16-2018, on vent from [place] until may 2020 - uses albuterol prn   Anxiety    Bacteremia due to Pseudomonas 06/2018   Chronic osteomyelitis ([place])    Chronic pain syndrome    Clostridium difficile colitis 10/30/2019   tx with abx    Depression    DVT (deep venous thrombosis) ([place]) 2020   right brachial post  PICC line   History of blood transfusion 06/2018   History of Clostridioides difficile colitis    History of kidney stones    Hypertension    norvasc d/c by pcp on 11/05/19   Multiple traumatic injuries    Penile pain 11/18/2019   Pneumonia 11/2009   2020 x 2   [place] as ambulation aid    Wheelchair bound    electric   Wound discharge    left hip wound with bloody/clear drainage change dressing q day surgilube with gauze, between legs wound using calcium algenate pad bid   Ht 6\' 3"  (1.905 m)    BMI 21.25 kg/m   Opioid Risk Score:   Fall Risk Score:  `1  Depression screen PHQ 2/9  Depression screen [facility] 2/9 07/05/2021 05/28/2021 05/10/2021 03/29/2021 02/10/2021 01/01/2021 09/02/2020  Decreased Interest 0 1 1 1 3 3 1   Down, Depressed, Hopeless 0 1 1 1 3 3 1   PHQ - 2 Score 0 2 2 2 6 6 2   Altered sleeping - - - - - - -  Tired, decreased energy - - - - - - -  Change in appetite - - - - - - -  Feeling  bad or failure about yourself  - - - - - 3 -  Trouble concentrating - - - - - - -  Moving slowly or fidgety/restless - - - - - - -  Suicidal thoughts - - - - - 0 -  PHQ-9 Score - - - - - - -  Difficult doing work/chores - - - - - Extremely dIfficult -  Some recent data might be hidden     Review of Systems  Constitutional: Negative.   HENT: Negative.    Eyes: Negative.   Respiratory: Negative.    Cardiovascular: Negative.   Gastrointestinal: Negative.   Endocrine: Negative.   Genitourinary: Negative.   Musculoskeletal: Negative.   Skin: Negative.   Allergic/Immunologic: Negative.   Neurological:  Positive for dizziness.  Hematological: Negative.   Psychiatric/Behavioral: Negative.        Objective:   Physical Exam Vitals and nursing note reviewed.  Constitutional:      Appearance: Normal appearance.  Cardiovascular:     Rate and Rhythm: Normal rate and regular rhythm.     Pulses: Normal pulses.     Heart sounds: Normal heart sounds.  Pulmonary:     Effort: Pulmonary  effort is normal.     Breath sounds: Normal breath sounds.  Musculoskeletal:     Cervical back: Normal range of motion and neck supple.     Comments: Normal Muscle Bulk and Muscle Testing Reveals:  Upper Extremities: Full ROM and Muscle Strength 5/5  Lumbar Paraspinal Tenderness: L-4-L-5 Lower Extremities: Right: Full ROM and Muscle Strength 4/5 Left Lower Extremity: Decreased ROM and Muscle Strength 3/5 Left Lower Extremity Flexion Produces Pain into his Left Hip Arrived in wheelchair    Skin:    General: Skin is warm and dry.  Neurological:     Mental Status: He is alert and oriented to person, place, and time.  Psychiatric:        Mood and Affect: Mood normal.        Behavior: Behavior normal.         Assessment & Plan:  1. Multiple Traumatic Injuries/ Multiple Fractures of Pelvis with unstable disruption of pelvic ring: Continue outpatient therapy with [place].  2. Injury of Urethra: Continue to Monitor.  3. Drainage from wound: Wound Care Following.  4. Anxiety and Depression: Continue Klonopin and Zoloft.  5. Debility: Continue Physical and Occupational Therapy, Continue to Monitor.  6. Protein-Calorie Malnutrition:  Has a Peg-Tube: Using according to PO intake. Continue to Monitor. Continue to encouraged Healthy Diet Regimen. Continue to Monitor.  7. Chronic Osteomyelitis: ID Following. Continue to Monitor.  8. Chronic Pain Syndrome: Refilled: Fentanyl 75 MCG one patch every other day  #1and MSIR one tablet every 4 hours as needed for pain  #150.We will continue the opioid monitoring program, this consists of regular clinic visits, examinations, urine drug screen, pill counts as well as use of [place] Controlled Substance Reporting system. A 12 month History has been reviewed on the [place] on 07/05/2021     F/U in 1 month

## 2021-07-07 ENCOUNTER — Other Ambulatory Visit: Payer: Self-pay | Admitting: Family Medicine

## 2021-07-12 ENCOUNTER — Other Ambulatory Visit: Payer: Self-pay | Admitting: Student

## 2021-07-12 ENCOUNTER — Other Ambulatory Visit: Payer: Self-pay

## 2021-07-12 DIAGNOSIS — K5903 Drug induced constipation: Secondary | ICD-10-CM

## 2021-07-12 DIAGNOSIS — G894 Chronic pain syndrome: Secondary | ICD-10-CM

## 2021-07-12 DIAGNOSIS — R531 Weakness: Secondary | ICD-10-CM

## 2021-07-12 DIAGNOSIS — R0602 Shortness of breath: Secondary | ICD-10-CM

## 2021-07-12 DIAGNOSIS — F32A Depression, unspecified: Secondary | ICD-10-CM

## 2021-07-12 DIAGNOSIS — F419 Anxiety disorder, unspecified: Secondary | ICD-10-CM

## 2021-07-12 DIAGNOSIS — M8668 Other chronic osteomyelitis, other site: Secondary | ICD-10-CM

## 2021-07-12 DIAGNOSIS — Z515 Encounter for palliative care: Secondary | ICD-10-CM

## 2021-07-12 NOTE — Progress Notes (Signed)
Therapist, [facility] Consult Note Telephone: [phone]  Fax: [phone]   Date of encounter: 07/12/21 11:04 AM PATIENT NAME: Christopher [name] [address] [place] [address]   [phone] (home)  DOB: 06/09/1966 MRN: [id number] PRIMARY CARE PROVIDER:    Ardith DarkParker, Caleb [name], [name],  [address] [place] [address] [phone]  REFERRING PROVIDER:   Ardith DarkParker, Caleb [name], [name] [address] [place],  [place] [address] [phone]  RESPONSIBLE PARTY:    Contact Information     Name Relation Home Work Mobile   [name] Spouse [phone]  [phone]   Christena DeemShepherd,Carla Sister [phone]          Due to the COVID-19 crisis, this visit was done via telemedicine from my office and it was initiated and consent by this patient and or family.  I connected with  Christopher CollumJames B [name] OR PROXY on 07/12/21 by a video enabled telemedicine application and verified that I am speaking with the correct person using two identifiers.   I discussed the limitations of evaluation and management by telemedicine. The patient expressed understanding and agreed to proceed.                                    ASSESSMENT AND PLAN / RECOMMENDATIONS:   Advance Care Planning/Goals of Care: Goals include to maximize quality of life and symptom management. Patient/health care surrogate gave his/her permission to discuss.Our advance care planning conversation included a discussion about:    The value and importance of advance care planning  Experiences with loved ones who have been seriously ill or have died  Exploration of personal, cultural or spiritual beliefs that might influence medical decisions  Exploration of goals of care in the event of a sudden injury or illness  Introduced MOST form; will complete on next visit.  CODE STATUS: Full Code  Education provided on palliative medicine versus hospice services.  Discussed role of palliative medicine;  will continue to provide pain and symptom management support.  Discussed goals of care.  Patient would like to improve with physical therapy, Occupational Therapy.  Be able to tolerate sitting up for longer periods of time. Patient wishes to receive aggressive treatment at this time.  We discussed should patient decline and no longer seeking aggressive treatments, would like to pursue comfort path we will refer for hospice services at that time when he meets eligibility requirements.  Symptom Management/Plan:  Chronic pain-continue Duragesic patch 75 mcg every other day, recommend increasing morphine frequency to every 3 hours as needed.  Continue gabapentin and Robaxin as directed.  We did discuss Cymbalta as an alternative; patient to see psychiatry next week and would like to see what recommendations they have given his anxiety/depression.  Anxiety/depression-continue paroxetine 40 mg daily, Abilify, trazodone nightly, recommend 50 mg of trazodone each morning to help with anxiety.  Clonazepam 0.25 mg every 6 hours as needed.  Patient to follow-up with psychiatry next week regarding recommendations.  Patient is also open to additional counseling support services with palliative social worker.  We will make recommendation.  Constipation-patient currently taking MiraLAX, senna S, Linzess.  Patient feels the Linzess time sometimes works too well.  Recommend taking every other day.  If this does not help we discussed decreasing to the 72 mcg daily.  Generalized weakness-continue therapy as directed.  Wheelchair when going longer distances.  Walker when ambulating short distances.  Shortness  of breath-continue oxygen at 3L/min.  Patient states he has been unable to tolerate weaning down due to anxiety.  Osteomyelitis of left hip, chronic wound-patient with chronic wound.  Patient to continue on Cipro and vancomycin likely indefinitely. Follow up with ID as scheduled. Continue dressing changes; HH SN and  follow up at wound clinic as scheduled.   Follow up Palliative Care Visit: Palliative care will continue to follow for complex medical decision making, advance care planning, and clarification of goals. Return 3-4 weeks or prn.   This visit was coded based on medical decision making (MDM).  PPS: 40%  HOSPICE ELIGIBILITY/DIAGNOSIS: TBD  Chief Complaint: Palliative medicine initial consult.  HISTORY OF PRESENT ILLNESS:  ANSAR LAMPA is a 56 y.o. year old male  with multiple traumatic fractures injuries, anxiety, depression, debility, protein calorie malnutrition, chronic osteomyelitis of left hip chronic pain syndrome.   Patient resides at home with wife; wife is primary caretaker  Patient is currently receiving PT, OT and SN services with advanced home health.  Patient endorses chronic pain; pain sometimes limits how he can participate with therapy. He is currently receiving Duragesic patch 75 mcg every other day.  He is taking morphine 15 mg every 4 hours as needed; he is requiring morphine at around the 3-hour mark. Patient also expresses having shortness of breath; he is wearing oxygen at 3 lpm.  He endorses constipation; feels the Linzess is working but also causing increased stools and irritation to stoma.  He continues with MiraLAX and senna as well.  Previously on Movantik. Patient has upcoming visit scheduled with psychiatry due to his anxiety and depression. He also has increased anxiety during the day.  Wife reports patient is eating mostly by mouth; he receives feeding tube supplements at night if he is not eating well during the day.  History obtained from review of EMR, discussion with primary team, and interview with family, facility staff/caregiver and/or Christopher [name].  I reviewed available labs, medications, imaging, studies and related documents from the EMR.  Records reviewed and summarized above.   ROS  General: NAD EYES: denies vision changes ENMT: denies  dysphagia Cardiovascular: denies chest pain,  DOE Pulmonary: denies cough, denies increased SOB Abdomen: + constipation, ostomy GU: denies dysuria MSK: increased weakness,  no falls reported Skin: denies rashes or wounds Neurological:  denies insomnia Psych: Endorses depressed mood Heme/lymph/immuno: denies bruises, abnormal bleeding  Physical Exam:  Constitutional: NAD General: frail appearing, ill appearing EYES: anicteric sclera, lids intact, no discharge  ENMT: intact hearing Pulmonary: no increased work of breathing, on oxygen via nasal canula  Abdomen: not assessed GU: deferred MSK: moves all extremities Skin: no rashes or wounds on visible skin Neuro: generalized weakness, no cognitive impairment Psych: non-anxious affect, A and O x 3 Hem/lymph/immuno: no widespread bruising CURRENT PROBLEM LIST:  Patient Active Problem List   Diagnosis Date Noted   Neuromuscular weakness ([place]) 06/24/2021   Depression, major, single episode, moderate ([place]) 06/24/2021   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   Chronic osteomyelitis of hip ([place]) 11/22/2020   Osteomyelitis ([place]) 11/22/2020   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/22/2020   Respiratory failure with hypoxia ([place]) 07/02/2020   Dysphagia 07/02/2020   Therapeutic opioid induced constipation 04/11/2020   Status post peripherally inserted central catheter (PICC) central line placement 03/17/2020   Skin-picking disorder 03/12/2020   Recurrent pain of right knee 11/08/2019   Elevated blood pressure reading 11/05/2019   Myofascial pain dysfunction syndrome [id number]   Complicated urinary  tract infection 05/30/2019   Wound infection, posttraumatic 02/28/2019   Hydronephrosis concurrent with and due to calculi of kidney and ureter 02/28/2019   GERD (gastroesophageal reflux disease) 02/28/2019   Suprapubic catheter ([place]) 02/28/2019   Anxiety 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 01/29/2019   Chronic pain due to trauma  01/29/2019   Neuropathic pain 01/29/2019   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   Insomnia 01/14/2019   Current use of proton pump inhibitor 01/14/2019   Wound of buttock 12/28/2018   Anxiety and depression 12/17/2018   Debility 11/23/2018   Chronic pain syndrome    Multiple traumatic injuries    Pressure injury of skin 08/13/2018   Urethral injury 07/13/2018   PAST MEDICAL HISTORY:  Active Ambulatory Problems    Diagnosis Date Noted   Urethral injury 07/13/2018   Pressure injury of skin 08/13/2018   Chronic pain syndrome    Multiple traumatic injuries    Debility 11/23/2018   Anxiety and depression 12/17/2018   Wound of buttock 12/28/2018   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   Insomnia 01/14/2019   Current use of proton pump inhibitor 01/14/2019   Anxiety 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 01/29/2019   Chronic pain due to trauma 01/29/2019   Neuropathic pain 01/29/2019   Wound infection, posttraumatic 02/28/2019   Hydronephrosis concurrent with and due to calculi of kidney and ureter 02/28/2019   GERD (gastroesophageal reflux disease) 02/28/2019   Suprapubic catheter ([place]) [id number]   Complicated urinary tract infection 05/30/2019   Myofascial pain dysfunction syndrome 08/05/2019   Elevated blood pressure reading 11/05/2019   Recurrent pain of right knee 11/08/2019   Skin-picking disorder 03/12/2020   Status post peripherally inserted central catheter (PICC) central line placement 03/17/2020   Therapeutic opioid induced constipation 04/11/2020   Respiratory failure with hypoxia ([place]) 07/02/2020   Dysphagia 07/02/2020   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/22/2020   Chronic osteomyelitis of hip ([place]) 11/22/2020   Osteomyelitis ([place]) 11/22/2020   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   Neuromuscular weakness ([place]) 06/24/2021   Depression, major, single episode, moderate ([place]) 06/24/2021   Resolved Ambulatory Problems    Diagnosis Date Noted   Fracture of  femoral neck, left ([place]) 07/10/2018   Multiple fractures of pelvis with unstable disruption of pelvic ring, initial encounter for open fracture ([place]) 07/10/2018   Acute hemorrhage 07/13/2018   Degloving injury of lower leg, initial encounter 07/13/2018   Scrotal injury, initial encounter 07/13/2018   Acute on chronic respiratory failure with hypoxia ([place])    Bacteremia due to Pseudomonas    Pain    Hypokalemia 12/17/2018   Drainage from wound 12/17/2018   Protein-calorie malnutrition ([place]) 12/17/2018   Sacral wound, initial encounter 12/28/2018   Tachycardia 01/14/2019   Reactive depression 01/29/2019   Protein-calorie malnutrition, moderate ([place]) 02/28/2019   Chronic osteomyelitis of pelvis, left ([place]) 03/07/2019   Cough 03/26/2019   Obstructive nephropathy 04/04/2019   Intractable nausea and vomiting 04/04/2019   PICC (peripherally inserted central catheter) in place 04/10/2019   Encounter for medication monitoring 04/10/2019   Encounter for long-term use of opiate analgesic 05/03/2019   Kidney stone 05/30/2019   Protein-calorie malnutrition, severe 06/01/2019   Bone infection of pelvis ([place]) 09/18/2019   Clostridium difficile colitis 10/30/2019   Pulmonary embolism ([place]) 10/30/2019   Aspiration pneumonia ([place]) 10/30/2019   C. difficile colitis 10/30/2019   Acute deep vein thrombosis (DVT) of brachial vein of right upper extremity ([place]) 11/01/2019   Chest pain    Complication  associated with peripherally inserted central catheter    Postobstructive pneumonia    Acute osteomyelitis of pelvic region and thigh (HCC)    Penile pain 11/18/2019   Bone infection of pelvis (HCC) 12/12/2019   Acute diastolic CHF (congestive heart failure) (HCC) 01/27/2020   CHF (congestive heart failure) (HCC) 01/27/2020   Abdominal pain 04/09/2020   Hypertensive urgency 04/11/2020   CAP (community acquired pneumonia) 06/20/2020   Sepsis (HCC) 06/20/2020   Multifocal pneumonia 10/14/2020    Abnormal urinalysis 10/14/2020   Chronic osteomyelitis (HCC) 11/22/2020   Severe malnutrition (HCC) 01/01/2021   Past Medical History:  Diagnosis Date   Anxiety    Depression    DVT (deep venous thrombosis) (HCC) 2020   History of blood transfusion 06/2018   History of Clostridioides difficile colitis    History of kidney stones    Hypertension    Pneumonia 11/2009   Walker as ambulation aid    Wheelchair bound    Wound discharge    SOCIAL HX:  Social History   Tobacco Use   Smoking status: Former    Packs/day: 1.00    Years: 20.00    Pack years: 20.00    Types: Cigarettes    Quit date: 07/10/2018    Years since quitting: 3.0   Smokeless tobacco: Never  Substance Use Topics   Alcohol use: Never   FAMILY HX:  Family History  Problem Relation Age of Onset   Breast cancer Mother        with mets to the bones      ALLERGIES:  Allergies  Allergen Reactions   Methadone Other (See Comments)    Hallucinations/confusion     PERTINENT MEDICATIONS:  Outpatient Encounter Medications as of 07/12/2021  Medication Sig   albuterol (VENTOLIN HFA) 108 (90 Base) MCG/ACT inhaler INHALE 2 PUFFS BY MOUTH EVERY 6 HOURS AS NEEDED FOR WHEEZE OR SHORTNESS OF BREATH   Amino Acids-Protein Hydrolys (FEEDING SUPPLEMENT, PRO-STAT 64,) LIQD Take 30 mLs by mouth 3 (three) times daily with meals.   amoxicillin-clavulanate (AUGMENTIN) 875-125 MG tablet Take 1 tablet by mouth 2 (two) times daily. One po bid x 7 days   ARIPiprazole (ABILIFY) 2 MG tablet TAKE 1 TABLET (2 MG TOTAL) BY MOUTH AT BEDTIME. FOR MOOD   Blood Pressure Monitoring (BLOOD PRESSURE CUFF) MISC Use daily as needed to check blood pressure.   ciprofloxacin (CIPRO) 500 MG tablet Take 1 tablet (500 mg total) by mouth 2 (two) times daily.   clonazePAM (KLONOPIN) 0.5 MG tablet Take 0.5 tablets (0.25 mg total) by mouth every 6 (six) hours as needed for anxiety.   dextromethorphan-guaiFENesin (MUCINEX DM) 30-600 MG 12hr tablet Take 2  tablets by mouth 2 (two) times daily.   famotidine (PEPCID) 40 MG tablet TAKE 1 TABLET BY MOUTH TWICE A DAY (Patient taking differently: Take 40 mg by mouth 2 (two) times daily.)   fentaNYL (DURAGESIC) 75 MCG/HR Place 1 patch onto the skin every other day.   gabapentin (NEURONTIN) 300 MG capsule Take 1 capsule (300 mg total) by mouth 3 (three) times daily.   lidocaine (LIDODERM) 5 % APPLY 3 PATCHES TO SKIN EVERY 12 HOURS AND DISCARD WITHING 12 HOURS OR AS DIRECTED   linaclotide (LINZESS) 145 MCG CAPS capsule Take 1 capsule (145 mcg total) by mouth daily before breakfast.   melatonin ([employer] MELATONIN) 3 MG TABS tablet Take 1 tablet (3 mg total) by mouth at bedtime.   methocarbamol (ROBAXIN) 500 MG tablet TAKE 1 TABLET BY MOUTH  EVERY 6 HOURS AS NEEDED FOR MUSCLE SPASMS (Patient taking differently: Take 500 mg by mouth every 6 (six) hours as needed for muscle spasms.)   mirabegron ER (MYRBETRIQ) 50 MG TB24 tablet Take 50 mg by mouth daily.   morphine (MSIR) 15 MG tablet Take 1 tablet (15 mg total) by mouth every 4 (four) hours as needed for severe pain.   Multiple Vitamin (MULTIVITAMIN WITH MINERALS) TABS tablet Take 1 tablet by mouth daily.   naloxone (NARCAN) nasal spray 4 mg/0.1 mL To use if pt develops unconsciousness or confusion that family thinks is related to opioids. (Patient not taking: Reported on 06/24/2021)   Nutritional Supplements (JEVITY 1.5 CAL PO) 237 mLs by Feeding Tube route 4 (four) times daily.   oxybutynin (DITROPAN) 5 MG tablet Take 5 mg by mouth See admin instructions. Take 5 mg by mouth three times a day and an additional 5 mg once daily as needed for urinary urgency   PARoxetine (PAXIL) 40 MG tablet Take 1 tablet (40 mg total) by mouth at bedtime.   Probiotic Product (PROBIOTIC COLON SUPPORT) CAPS Take 1 capsule by mouth daily.   promethazine (PHENERGAN) 12.5 MG tablet TAKE 1 TABLET (12.5 MG TOTAL) BY MOUTH EVERY 6 (SIX) HOURS AS NEEDED FOR NAUSEA, VOMITING OR REFRACTORY NAUSEA /  VOMITING.   QUEtiapine (SEROQUEL) 25 MG tablet TAKE 1-2 TABLETS (25-50 MG TOTAL) BY MOUTH 2 (TWO) TIMES DAILY. TAKE 25 MG IN AM- AND 50-100 MG NIGHTLY AS NEEDED FOR AGITATION AND CONFUSION - DO NOT EXCEED MORE THAN 4 TABLETS PER DAY (Patient taking differently: Take 25-100 mg by mouth See admin instructions.)   senna-docusate (SENOKOT-S) 8.6-50 MG tablet Take 2 tablets by mouth 3 (three) times daily.   traZODone (DESYREL) 100 MG tablet TAKE 1 TO 2 TABLETS AT BEDTIME (Patient taking differently: Take 100-200 mg by mouth at bedtime.)   vancomycin (VANCOCIN) 125 MG capsule Take 1 capsule (125 mg total) by mouth in the morning and at bedtime.   No facility-administered encounter medications on file as of 07/12/2021.   Thank you for the opportunity to participate in the care of Mr. Wark.  The palliative care team will continue to follow. Please call our office at [phone] if we can be of additional assistance.   Ezekiel Slocumb, NP   COVID-19 PATIENT SCREENING TOOL Asked and negative response unless otherwise noted:  Have you had symptoms of covid, tested positive or been in contact with someone with symptoms/positive test in the past 5-10 days? No

## 2021-07-13 ENCOUNTER — Encounter (HOSPITAL_BASED_OUTPATIENT_CLINIC_OR_DEPARTMENT_OTHER): Payer: PRIVATE HEALTH INSURANCE | Admitting: Internal Medicine

## 2021-07-13 DIAGNOSIS — Z87891 Personal history of nicotine dependence: Secondary | ICD-10-CM

## 2021-07-13 DIAGNOSIS — Z87440 Personal history of urinary (tract) infections: Secondary | ICD-10-CM

## 2021-07-13 DIAGNOSIS — J9611 Chronic respiratory failure with hypoxia: Secondary | ICD-10-CM

## 2021-07-13 DIAGNOSIS — F32A Depression, unspecified: Secondary | ICD-10-CM

## 2021-07-13 DIAGNOSIS — Z7951 Long term (current) use of inhaled steroids: Secondary | ICD-10-CM

## 2021-07-13 DIAGNOSIS — E876 Hypokalemia: Secondary | ICD-10-CM

## 2021-07-13 DIAGNOSIS — T8452XD Infection and inflammatory reaction due to internal left hip prosthesis, subsequent encounter: Secondary | ICD-10-CM

## 2021-07-13 DIAGNOSIS — G894 Chronic pain syndrome: Secondary | ICD-10-CM

## 2021-07-13 DIAGNOSIS — Z993 Dependence on wheelchair: Secondary | ICD-10-CM

## 2021-07-13 DIAGNOSIS — Z435 Encounter for attention to cystostomy: Secondary | ICD-10-CM

## 2021-07-13 DIAGNOSIS — R339 Retention of urine, unspecified: Secondary | ICD-10-CM | POA: Diagnosis not present

## 2021-07-13 DIAGNOSIS — G4700 Insomnia, unspecified: Secondary | ICD-10-CM

## 2021-07-13 DIAGNOSIS — Z466 Encounter for fitting and adjustment of urinary device: Secondary | ICD-10-CM

## 2021-07-13 DIAGNOSIS — Z9981 Dependence on supplemental oxygen: Secondary | ICD-10-CM

## 2021-07-13 DIAGNOSIS — K219 Gastro-esophageal reflux disease without esophagitis: Secondary | ICD-10-CM

## 2021-07-13 DIAGNOSIS — Z79899 Other long term (current) drug therapy: Secondary | ICD-10-CM

## 2021-07-13 DIAGNOSIS — M86652 Other chronic osteomyelitis, left thigh: Secondary | ICD-10-CM

## 2021-07-13 DIAGNOSIS — E43 Unspecified severe protein-calorie malnutrition: Secondary | ICD-10-CM

## 2021-07-13 DIAGNOSIS — F1120 Opioid dependence, uncomplicated: Secondary | ICD-10-CM

## 2021-07-13 DIAGNOSIS — I10 Essential (primary) hypertension: Secondary | ICD-10-CM | POA: Diagnosis not present

## 2021-07-13 DIAGNOSIS — Z79891 Long term (current) use of opiate analgesic: Secondary | ICD-10-CM

## 2021-07-13 DIAGNOSIS — L89152 Pressure ulcer of sacral region, stage 2: Secondary | ICD-10-CM | POA: Diagnosis not present

## 2021-07-13 DIAGNOSIS — Z933 Colostomy status: Secondary | ICD-10-CM

## 2021-07-13 DIAGNOSIS — D649 Anemia, unspecified: Secondary | ICD-10-CM

## 2021-07-13 DIAGNOSIS — R1310 Dysphagia, unspecified: Secondary | ICD-10-CM

## 2021-07-13 DIAGNOSIS — T8489XD Other specified complication of internal orthopedic prosthetic devices, implants and grafts, subsequent encounter: Secondary | ICD-10-CM

## 2021-07-13 DIAGNOSIS — F419 Anxiety disorder, unspecified: Secondary | ICD-10-CM

## 2021-07-13 DIAGNOSIS — T50905D Adverse effect of unspecified drugs, medicaments and biological substances, subsequent encounter: Secondary | ICD-10-CM

## 2021-07-13 DIAGNOSIS — G8220 Paraplegia, unspecified: Secondary | ICD-10-CM | POA: Diagnosis not present

## 2021-07-13 DIAGNOSIS — I700 Atherosclerosis of aorta: Secondary | ICD-10-CM

## 2021-07-13 DIAGNOSIS — K5903 Drug induced constipation: Secondary | ICD-10-CM

## 2021-07-13 DIAGNOSIS — Z792 Long term (current) use of antibiotics: Secondary | ICD-10-CM

## 2021-07-15 ENCOUNTER — Telehealth: Payer: Self-pay

## 2021-07-15 NOTE — Telephone Encounter (Signed)
Their nurse practitioner would like to know if the patient can take 50mg  of the Trazodone in the morning as well as 50mg  at bedtime.

## 2021-07-15 NOTE — Telephone Encounter (Signed)
Message sent to [facility] NP regarding palliative care visit with NP and recommendations to increase Morphine IR 15mg  from Q4hours to Q3hours.

## 2021-07-15 NOTE — Telephone Encounter (Signed)
Phone call placed to Dr. Wilber Oliphant Parker's office to provide update on palliative care visit including Palliative NP recommendations to increase Trazodone 50mg  from QHS to BID. Palliative care progress note faxed to PCP office as well with update and PC recommendations.Spoke with who took message for Dr. Para March.

## 2021-07-16 NOTE — Telephone Encounter (Signed)
I have already responded to the message in epic this morning. Yes he can increase the trazodone.  Katina Degree. [name], MD 07/16/2021 9:08 AM

## 2021-07-16 NOTE — Telephone Encounter (Signed)
Please advise 

## 2021-07-22 ENCOUNTER — Telehealth: Payer: Self-pay

## 2021-07-22 NOTE — Telephone Encounter (Signed)
Please advise 

## 2021-07-22 NOTE — Telephone Encounter (Signed)
Can we please check on status of his referral? I am fine with her taking over his psych prescriptions - his PM&R doctor had been prescribing them.  Katina Degree. [name], MD 07/22/2021 3:57 PM

## 2021-07-22 NOTE — Telephone Encounter (Signed)
Diane is requesting a call back in order to discuss patients case.  Would like to know if she is to take over medications?  Also, states that patient was referred to Palliative Care.  Would like to have a status update on this and would like to know if palliative Care would have to take over medications?  Looking at referral for palliative care this has been closed out.  I will get Misty Stanley to dig further.

## 2021-07-23 NOTE — Telephone Encounter (Signed)
Spoke w/ pt wife and verified DOB, advised they already have appts set up and nothing further is needed at this time.

## 2021-07-24 ENCOUNTER — Other Ambulatory Visit: Payer: Self-pay | Admitting: Physical Medicine and Rehabilitation

## 2021-07-26 ENCOUNTER — Other Ambulatory Visit: Payer: Self-pay

## 2021-07-26 ENCOUNTER — Other Ambulatory Visit: Payer: Self-pay | Admitting: Student

## 2021-07-26 DIAGNOSIS — Z515 Encounter for palliative care: Secondary | ICD-10-CM

## 2021-07-26 DIAGNOSIS — G894 Chronic pain syndrome: Secondary | ICD-10-CM

## 2021-07-26 DIAGNOSIS — F419 Anxiety disorder, unspecified: Secondary | ICD-10-CM

## 2021-07-26 DIAGNOSIS — R531 Weakness: Secondary | ICD-10-CM
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
## 2021-07-27 ENCOUNTER — Other Ambulatory Visit: Payer: Self-pay

## 2021-07-27 ENCOUNTER — Encounter (HOSPITAL_BASED_OUTPATIENT_CLINIC_OR_DEPARTMENT_OTHER): Payer: No Typology Code available for payment source | Attending: Internal Medicine | Admitting: Internal Medicine

## 2021-07-27 DIAGNOSIS — M8638 Chronic multifocal osteomyelitis, other site: Secondary | ICD-10-CM | POA: Diagnosis not present

## 2021-07-27 DIAGNOSIS — S71002A Unspecified open wound, left hip, initial encounter: Secondary | ICD-10-CM | POA: Diagnosis not present

## 2021-07-27 DIAGNOSIS — S71001A Unspecified open wound, right hip, initial encounter: Secondary | ICD-10-CM | POA: Diagnosis present

## 2021-07-27 DIAGNOSIS — X58XXXA Exposure to other specified factors, initial encounter: Secondary | ICD-10-CM | POA: Diagnosis not present

## 2021-07-27 DIAGNOSIS — M8630 Chronic multifocal osteomyelitis, unspecified site: Secondary | ICD-10-CM | POA: Diagnosis not present
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
## 2021-07-29 NOTE — Progress Notes (Signed)
CELESTER, SCALISI ([id number]) Visit Report for 07/27/2021 Arrival Information Details Patient Name: Date of Service: Ernestine Conrad MES B. 07/27/2021 10:45 A M Medical Record Number: [id number] Patient Account Number: [id number] Date of Birth/Sex: Treating RN: 15-Jul-1965 (56 y.o. Burnadette Pop, [name] Primary Care Virgilio Broadhead: Dimas Chyle Other Clinician: Referring Opha Mcghee: Treating Kelli Robeck/Extender: Jessee Avers in Treatment: 18 Visit Information History Since Last Visit Added or deleted any medications: No Patient Arrived: Wheel Chair Any new allergies or adverse reactions: No Arrival Time: 11:18 Had a fall or experienced change in No Accompanied By: family activities of daily living that may affect Transfer Assistance: Manual risk of falls: Patient Identification Verified: Yes Signs or symptoms of abuse/neglect since last visito No Secondary Verification Process Completed: Yes Hospitalized since last visit: No Patient Requires Transmission-Based Precautions: No Implantable device outside of the clinic excluding No Patient Has Alerts: No cellular tissue based products placed in the center since last visit: Has Dressing in Place as Prescribed: Yes Pain Present Now: No Electronic Signature(s) Signed: 07/29/2021 5:08:54 PM By: Rhae Hammock RN Entered By: Rhae Hammock on 07/27/2021 11:19:00 -------------------------------------------------------------------------------- Clinic Level of Care Assessment Details Patient Name: Date of Service: Ernestine Conrad MES B. 07/27/2021 10:45 A M Medical Record Number: [id number] Patient Account Number: [id number] Date of Birth/Sex: Treating RN: 1965-11-15 (56 y.o. Burnadette Pop, [name] Primary Care Lizania Bouchard: Dimas Chyle Other Clinician: Referring Jerrianne Hartin: Treating Myliyah Rebuck/Extender: Jessee Avers in Treatment: 18 Clinic Level of Care Assessment Items TOOL 4 Quantity Score X- 1  0 Use when only an EandM is performed on FOLLOW-UP visit ASSESSMENTS - Nursing Assessment / Reassessment X- 1 10 Reassessment of Co-morbidities (includes updates in patient status) X- 1 5 Reassessment of Adherence to Treatment Plan ASSESSMENTS - Wound and Skin A ssessment / Reassessment X - Simple Wound Assessment / Reassessment - one wound 1 5 []  - 0 Complex Wound Assessment / Reassessment - multiple wounds []  - 0 Dermatologic / Skin Assessment (not related to wound area) ASSESSMENTS - Focused Assessment []  - 0 Circumferential Edema Measurements - multi extremities []  - 0 Nutritional Assessment / Counseling / Intervention []  - 0 Lower Extremity Assessment (monofilament, tuning fork, pulses) []  - 0 Peripheral Arterial Disease Assessment (using hand held doppler) ASSESSMENTS - Ostomy and/or Continence Assessment and Care []  - 0 Incontinence Assessment and Management []  - 0 Ostomy Care Assessment and Management (repouching, etc.) PROCESS - Coordination of Care X - Simple Patient / Family Education for ongoing care 1 15 []  - 0 Complex (extensive) Patient / Family Education for ongoing care X- 1 10 Staff obtains Programmer, systems, Records, T Results / Process Orders est []  - 0 Staff telephones HHA, Nursing Homes / Clarify orders / etc []  - 0 Routine Transfer to another Facility (non-emergent condition) []  - 0 Routine Hospital Admission (non-emergent condition) []  - 0 New Admissions / Biomedical engineer / Ordering NPWT Apligraf, etc. , []  - 0 Emergency Hospital Admission (emergent condition) X- 1 10 Simple Discharge Coordination []  - 0 Complex (extensive) Discharge Coordination PROCESS - Special Needs []  - 0 Pediatric / Minor Patient Management []  - 0 Isolation Patient Management []  - 0 Hearing / Language / Visual special needs []  - 0 Assessment of Community assistance (transportation, D/C planning, etc.) []  - 0 Additional assistance / Altered mentation []  -  0 Support Surface(s) Assessment (bed, cushion, seat, etc.) INTERVENTIONS - Wound Cleansing / Measurement X - Simple Wound Cleansing - one wound 1 5 []  - 0 Complex Wound  Cleansing - multiple wounds X- 1 5 Wound Imaging (photographs - any number of wounds) []  - 0 Wound Tracing (instead of photographs) X- 1 5 Simple Wound Measurement - one wound []  - 0 Complex Wound Measurement - multiple wounds INTERVENTIONS - Wound Dressings X - Small Wound Dressing one or multiple wounds 1 10 []  - 0 Medium Wound Dressing one or multiple wounds []  - 0 Large Wound Dressing one or multiple wounds X- 1 5 Application of Medications - topical []  - 0 Application of Medications - injection INTERVENTIONS - Miscellaneous []  - 0 External ear exam []  - 0 Specimen Collection (cultures, biopsies, blood, body fluids, etc.) []  - 0 Specimen(s) / Culture(s) sent or taken to Lab for analysis []  - 0 Patient Transfer (multiple staff / Civil Service fast streamer / Similar devices) []  - 0 Simple Staple / Suture removal (25 or less) []  - 0 Complex Staple / Suture removal (26 or more) []  - 0 Hypo / Hyperglycemic Management (close monitor of Blood Glucose) []  - 0 Ankle / Brachial Index (ABI) - do not check if billed separately X- 1 5 Vital Signs Has the patient been seen at the hospital within the last three years: Yes Total Score: 90 Level Of Care: New/Established - Level 3 Electronic Signature(s) Signed: 07/29/2021 5:08:54 PM By: Rhae Hammock RN Entered By: Rhae Hammock on 07/27/2021 12:04:44 -------------------------------------------------------------------------------- Complex / Palliative Patient Assessment Details Patient Name: Date of Service: Ernestine Conrad MES B. 07/27/2021 10:45 A M Medical Record Number: [id number] Patient Account Number: [id number] Date of Birth/Sex: Treating RN: 02-20-1966 (56 y.o. Janyth Contes Primary Care Glenette Bookwalter: Dimas Chyle Other Clinician: Referring  [employer]: Treating Stefen Juba/Extender: Jessee Avers in Treatment: 18 Complex Wound Management Criteria Patient has remarkable or complex co-morbidities requiring medications or treatments that extend wound healing times. Examples: Diabetes mellitus with chronic renal failure or end stage renal disease requiring dialysis Advanced or poorly controlled rheumatoid arthritis Diabetes mellitus and end stage chronic obstructive pulmonary disease Active cancer with current chemo- or radiation therapy Chronic Osteomyelitis Palliative Wound Management Criteria Care Approach Wound Care Plan: Complex Wound Management Electronic Signature(s) Signed: 07/29/2021 9:35:28 AM By: Kalman Shan DO Signed: 07/29/2021 6:28:13 PM By: Levan Hurst RN, BSN Entered By: Levan Hurst on 07/28/2021 15:28:00 -------------------------------------------------------------------------------- Encounter Discharge Information Details Patient Name: Date of Service: Madelin Headings, Greggory Brandy MES B. 07/27/2021 10:45 A M Medical Record Number: [id number] Patient Account Number: [id number] Date of Birth/Sex: Treating RN: 1965-07-22 (56 y.o. Burnadette Pop, [name] Primary Care Turki Tapanes: Dimas Chyle Other Clinician: Referring Luz Burcher: Treating Latrail Pounders/Extender: Jessee Avers in Treatment: 18 Encounter Discharge Information Items Discharge Condition: Stable Ambulatory Status: Wheelchair Discharge Destination: Home Transportation: Private Auto Accompanied By: family Schedule Follow-up Appointment: Yes Clinical Summary of Care: Patient Declined Electronic Signature(s) Signed: 07/29/2021 5:08:54 PM By: Rhae Hammock RN Entered By: Rhae Hammock on 07/27/2021 12:06:39 -------------------------------------------------------------------------------- Lower Extremity Assessment Details Patient Name: Date of Service: Ernestine Conrad MES B. 07/27/2021 10:45 A M Medical Record  Number: [id number] Patient Account Number: [id number] Date of Birth/Sex: Treating RN: [birth year] (56 y.o. Erie Noe Primary Care Quetzaly Ebner: Dimas Chyle Other Clinician: Referring Marshay Slates: Treating Danyon Mcginness/Extender: Laurena Slimmer Weeks in Treatment: 18 Electronic Signature(s) Signed: 07/29/2021 5:08:54 PM By: Rhae Hammock RN Entered By: Rhae Hammock on 07/27/2021 11:19:34 -------------------------------------------------------------------------------- Multi Wound Chart Details Patient Name: Date of Service: Madelin Headings, Greggory Brandy MES B. 07/27/2021 10:45 A M Medical Record Number: [id number] Patient Account Number: [id number] Date of Birth/Sex: Treating RN: 1965/07/21 (  56 y.o. Hessie Diener Primary Care Prabhjot Maddux: Dimas Chyle Other Clinician: Referring Lacrecia Delval: Treating Takerra Lupinacci/Extender: Jessee Avers in Treatment: 18 Vital Signs Height(in): 75 Pulse(bpm): 93 Weight(lbs): 160 Blood Pressure(mmHg): 126/86 Body Mass Index(BMI): 20 Temperature(F): 97.8 Respiratory Rate(breaths/min): 17 Photos: [N/A:N/A] Left Trochanter N/A N/A Wound Location: Trauma N/A N/A Wounding Event: Trauma, Other N/A N/A Primary Etiology: Hypertension, Osteomyelitis N/A N/A Comorbid History: 07/10/2018 N/A N/A Date Acquired: 20 N/A N/A Weeks of Treatment: Open N/A N/A Wound Status: No N/A N/A Wound Recurrence: 0.5x1x3.5 N/A N/A Measurements L x W x D (cm) 0.393 N/A N/A A (cm) : rea 1.374 N/A N/A Volume (cm) : 16.60% N/A N/A % Reduction in A rea: 27.10% N/A N/A % Reduction in Volume: Full Thickness Without Exposed N/A N/A Classification: Support Structures Medium N/A N/A Exudate Amount: Serosanguineous N/A N/A Exudate Type: red, brown N/A N/A Exudate Color: Distinct, outline attached N/A N/A Wound Margin: Large (67-100%) N/A N/A Granulation Amount: Red, Pink N/A N/A Granulation Quality: None Present (0%) N/A  N/A Necrotic Amount: Fat Layer (Subcutaneous Tissue): Yes N/A N/A Exposed Structures: Fascia: No Tendon: No Muscle: No Joint: No Bone: No Small (1-33%) N/A N/A Epithelialization: Treatment Notes Wound #1 (Trochanter) Wound Laterality: Left Cleanser Wound Cleanser Discharge Instruction: Cleanse the wound with wound cleanser prior to applying a clean dressing using gauze sponges, not tissue or cotton balls. Peri-Wound Care Skin Prep Discharge Instruction: Use skin prep as directed Topical Primary Dressing Iodoform packing strip 1/2 (in) Discharge Instruction: Lightly pack as instructed Secondary Dressing Woven Gauze Sponge, Non-Sterile 4x4 in Discharge Instruction: Apply over primary dressing as directed. Zetuvit Plus Silicone Border Dressing 4x4 (in/in) Discharge Instruction: Apply silicone border over primary dressing as directed. Secured With Compression Wrap Compression Stockings Environmental education officer) Signed: 07/27/2021 12:49:21 PM By: Kalman Shan DO Signed: 07/27/2021 5:28:21 PM By: Deon Pilling RN, BSN Entered By: Kalman Shan on 07/27/2021 12:45:20 -------------------------------------------------------------------------------- Multi-Disciplinary Care Plan Details Patient Name: Date of Service: Madelin Headings, Greggory Brandy MES B. 07/27/2021 10:45 A M Medical Record Number: [id number] Patient Account Number: [id number] Date of Birth/Sex: Treating RN: Dec 21, 1965 (56 y.o. Burnadette Pop, [name] Primary Care Kynslei Art: Dimas Chyle Other Clinician: Referring Laurelin Elson: Treating Yamin Swingler/Extender: Jessee Avers in Treatment: 18 Active Inactive Wound/Skin Impairment Nursing Diagnoses: Impaired tissue integrity Knowledge deficit related to ulceration/compromised skin integrity Goals: Patient will have a decrease in wound volume by X% from date: (specify in notes) Date Initiated: 03/23/2021 Target Resolution Date: 08/14/2021 Goal Status:  Active Patient/caregiver will verbalize understanding of skin care regimen Date Initiated: 03/23/2021 Target Resolution Date: 08/14/2021 Goal Status: Active Ulcer/skin breakdown will have a volume reduction of 30% by week 4 Date Initiated: 03/23/2021 Date Inactivated: 05/18/2021 Target Resolution Date: 05/18/2021 Unmet Reason: wound remains the Goal Status: Unmet same. Interventions: Assess patient/caregiver ability to obtain necessary supplies Assess patient/caregiver ability to perform ulcer/skin care regimen upon admission and as needed Assess ulceration(s) every visit Notes: Electronic Signature(s) Signed: 07/29/2021 5:08:54 PM By: Rhae Hammock RN Entered By: Rhae Hammock on 07/27/2021 11:53:29 -------------------------------------------------------------------------------- Pain Assessment Details Patient Name: Date of Service: Ernestine Conrad MES B. 07/27/2021 10:45 A M Medical Record Number: [id number] Patient Account Number: [id number] Date of Birth/Sex: Treating RN: Mar 28, 1966 (56 y.o. Erie Noe Primary Care Margret Moat: Dimas Chyle Other Clinician: Referring Tammee Thielke: Treating Stefanee Mckell/Extender: Jessee Avers in Treatment: 18 Active Problems Location of Pain Severity and Description of Pain Patient Has Paino Yes Site Locations Pain Location: Pain in Ulcers With Dressing Change:  Yes Duration of the Pain. Constant / Intermittento Intermittent Rate the pain. Current Pain Level: 10 Worst Pain Level: 10 Least Pain Level: 0 Tolerable Pain Level: 10 Character of Pain Describe the Pain: Aching Pain Management and Medication Current Pain Management: Medication: No Cold Application: No Rest: No Massage: No Activity: No T.E.N.S.: No Heat Application: No Leg drop or elevation: No Is the Current Pain Management Adequate: Adequate How does your wound impact your activities of daily livingo Sleep: No Bathing: No Appetite:  No Relationship With Others: No Bladder Continence: No Emotions: No Bowel Continence: No Work: No Toileting: No Drive: No Dressing: No Hobbies: No Electronic Signature(s) Signed: 07/29/2021 5:08:54 PM By: Rhae Hammock RN Entered By: Rhae Hammock on 07/27/2021 11:23:43 -------------------------------------------------------------------------------- Patient/Caregiver Education Details Patient Name: Date of Service: Ernestine Conrad MES B. 2/14/2023andnbsp10:45 A M Medical Record Number: [id number] Patient Account Number: [id number] Date of Birth/Gender: Treating RN: 1965/10/29 (56 y.o. Erie Noe Primary Care Physician: Dimas Chyle Other Clinician: Referring Physician: Treating Physician/Extender: Jessee Avers in Treatment: 18 Education Assessment Education Provided To: Patient Education Topics Provided Wound/Skin Impairment: Methods: Explain/Verbal Responses: State content correctly Electronic Signature(s) Signed: 07/29/2021 5:08:54 PM By: Rhae Hammock RN Entered By: Rhae Hammock on 07/27/2021 11:52:43 -------------------------------------------------------------------------------- Wound Assessment Details Patient Name: Date of Service: Ernestine Conrad MES B. 07/27/2021 10:45 A M Medical Record Number: [id number] Patient Account Number: [id number] Date of Birth/Sex: Treating RN: 1966/03/12 (56 y.o. Burnadette Pop, [name] Primary Care Caitlynne Harbeck: Dimas Chyle Other Clinician: Referring Asim Gersten: Treating Kimoni Pickerill/Extender: Laurena Slimmer Weeks in Treatment: 18 Wound Status Wound Number: 1 Primary Etiology: Trauma, Other Wound Location: Left Trochanter Wound Status: Open Wounding Event: Trauma Comorbid History: Hypertension, Osteomyelitis Date Acquired: 07/10/2018 Weeks Of Treatment: 18 Clustered Wound: No Photos Wound Measurements Length: (cm) 0.5 Width: (cm) 1 Depth: (cm) 3.5 Area: (cm)  0.393 Volume: (cm) 1.374 % Reduction in Area: 16.6% % Reduction in Volume: 27.1% Epithelialization: Small (1-33%) Tunneling: No Undermining: No Wound Description Classification: Full Thickness Without Exposed Support Structures Wound Margin: Distinct, outline attached Exudate Amount: Medium Exudate Type: Serosanguineous Exudate Color: red, brown Foul Odor After Cleansing: No Slough/Fibrino No Wound Bed Granulation Amount: Large (67-100%) Exposed Structure Granulation Quality: Red, Pink Fascia Exposed: No Necrotic Amount: None Present (0%) Fat Layer (Subcutaneous Tissue) Exposed: Yes Tendon Exposed: No Muscle Exposed: No Joint Exposed: No Bone Exposed: No Treatment Notes Wound #1 (Trochanter) Wound Laterality: Left Cleanser Wound Cleanser Discharge Instruction: Cleanse the wound with wound cleanser prior to applying a clean dressing using gauze sponges, not tissue or cotton balls. Peri-Wound Care Skin Prep Discharge Instruction: Use skin prep as directed Topical Primary Dressing Iodoform packing strip 1/2 (in) Discharge Instruction: Lightly pack as instructed Secondary Dressing Woven Gauze Sponge, Non-Sterile 4x4 in Discharge Instruction: Apply over primary dressing as directed. Zetuvit Plus Silicone Border Dressing 4x4 (in/in) Discharge Instruction: Apply silicone border over primary dressing as directed. Secured With Compression Wrap Compression Stockings Environmental education officer) Signed: 07/29/2021 5:08:54 PM By: Rhae Hammock RN Entered By: Rhae Hammock on 07/27/2021 11:34:01 -------------------------------------------------------------------------------- Vitals Details Patient Name: Date of Service: Madelin Headings, Greggory Brandy MES B. 07/27/2021 10:45 A M Medical Record Number: [id number] Patient Account Number: [id number] Date of Birth/Sex: Treating RN: [birth year] (56 y.o. Erie Noe Primary Care Rohnan Bartleson: Dimas Chyle Other  Clinician: Referring Eliyanna Ault: Treating Shira Bobst/Extender: Jessee Avers in Treatment: 18 Vital Signs Time Taken: 11:19 Temperature (F): 97.8 Height (in): 75 Pulse (bpm): 93 Weight (lbs): 160 Respiratory Rate (breaths/min):  17 Body Mass Index (BMI): 20 Blood Pressure (mmHg): 126/86 Reference Range: 80 - 120 mg / dl Electronic Signature(s) Signed: 07/29/2021 5:08:54 PM By: Rhae Hammock RN Entered By: Rhae Hammock on 07/27/2021 11:26:03

## 2021-08-03 ENCOUNTER — Telehealth: Payer: Self-pay

## 2021-08-03 ENCOUNTER — Other Ambulatory Visit: Payer: Self-pay | Admitting: Family

## 2021-08-03 DIAGNOSIS — Z9189 Other specified personal risk factors, not elsewhere classified: Secondary | ICD-10-CM

## 2021-08-03 NOTE — Telephone Encounter (Signed)
Patient has refills on file

## 2021-08-03 NOTE — Telephone Encounter (Signed)
Wife called for Morphine IR 15 mg #138 on 07/10/21. Fentanyl patches 75 mcg #15 on 07/07/21.

## 2021-08-05 ENCOUNTER — Other Ambulatory Visit: Payer: Self-pay

## 2021-08-05 ENCOUNTER — Telehealth: Payer: Self-pay | Admitting: Student

## 2021-08-05 ENCOUNTER — Emergency Department (HOSPITAL_COMMUNITY)
Admission: EM | Admit: 2021-08-05 | Discharge: 2021-08-05 | Disposition: A | Discharge status: Home / Self Care | Payer: No Typology Code available for payment source | Attending: Emergency Medicine | Admitting: Emergency Medicine

## 2021-08-05 ENCOUNTER — Encounter (HOSPITAL_COMMUNITY): Payer: Self-pay

## 2021-08-05 ENCOUNTER — Emergency Department (HOSPITAL_COMMUNITY): Payer: No Typology Code available for payment source

## 2021-08-05 DIAGNOSIS — R109 Unspecified abdominal pain: Secondary | ICD-10-CM | POA: Diagnosis present

## 2021-08-05 DIAGNOSIS — N390 Urinary tract infection, site not specified: Secondary | ICD-10-CM | POA: Insufficient documentation

## 2021-08-05 DIAGNOSIS — G8929 Other chronic pain: Secondary | ICD-10-CM | POA: Insufficient documentation

## 2021-08-05 LAB — LIPASE, BLOOD: Lipase: 23 U/L (ref 11–51)

## 2021-08-05 LAB — CBC
HCT: 43.5 % (ref 39.0–52.0)
Hemoglobin: 14 g/dL (ref 13.0–17.0)
MCH: 30.6 pg (ref 26.0–34.0)
MCHC: 32.2 g/dL (ref 30.0–36.0)
MCV: 95.2 fL (ref 80.0–100.0)
Platelets: 270 10*3/uL (ref 150–400)
RBC: 4.57 MIL/uL (ref 4.22–5.81)
RDW: 12 % (ref 11.5–15.5)
WBC: 10.5 10*3/uL (ref 4.0–10.5)
nRBC: 0 % (ref 0.0–0.2)

## 2021-08-05 LAB — COMPREHENSIVE METABOLIC PANEL
ALT: 16 U/L (ref 0–44)
AST: 20 U/L (ref 15–41)
Albumin: 3.8 g/dL (ref 3.5–5.0)
Alkaline Phosphatase: 143 U/L — ABNORMAL HIGH (ref 38–126)
Anion gap: 11 (ref 5–15)
BUN: 14 mg/dL (ref 6–20)
CO2: 26 mmol/L (ref 22–32)
Calcium: 10.1 mg/dL (ref 8.9–10.3)
Chloride: 100 mmol/L (ref 98–111)
Creatinine, Ser: 0.79 mg/dL (ref 0.61–1.24)
GFR, Estimated: >60 mL/min (ref >60)
Glucose, Bld: 148 mg/dL — ABNORMAL HIGH (ref 70–99)
Potassium: 3.9 mmol/L (ref 3.5–5.1)
Sodium: 137 mmol/L (ref 135–145)
Total Bilirubin: 0.5 mg/dL (ref 0.3–1.2)
Total Protein: 8.3 g/dL — ABNORMAL HIGH (ref 6.5–8.1)

## 2021-08-05 LAB — URINALYSIS, ROUTINE W REFLEX MICROSCOPIC
Bilirubin Urine: NEGATIVE
Glucose, UA: NEGATIVE mg/dL
Hgb urine dipstick: NEGATIVE
Ketones, ur: NEGATIVE mg/dL
Nitrite: POSITIVE — AB
Protein, ur: 100 mg/dL — AB
Specific Gravity, Urine: 1.016 (ref 1.005–1.030)
pH: 9 — ABNORMAL HIGH (ref 5.0–8.0)

## 2021-08-05 MED ORDER — FLUCONAZOLE 200 MG PO TABS: 200.0000 mg | ORAL_TABLET | Freq: Every day | ORAL | 0 refills | Status: AC

## 2021-08-05 MED ORDER — LORAZEPAM 2 MG/ML IJ SOLN
1.0000 mg | Freq: Once | INTRAMUSCULAR | Status: AC
  Administered 2021-08-05: 1 mg via INTRAVENOUS
  Filled 2021-08-05: qty 1

## 2021-08-05 MED ORDER — HYDROMORPHONE HCL 1 MG/ML IJ SOLN
1.0000 mg | Freq: Once | INTRAMUSCULAR | Status: AC
  Administered 2021-08-05: 1 mg via INTRAVENOUS
  Filled 2021-08-05: qty 1

## 2021-08-05 MED ORDER — IOHEXOL 300 MG/ML  SOLN: 100.0000 mL | Freq: Once | INTRAMUSCULAR | Status: DC | PRN

## 2021-08-05 MED ORDER — FLUCONAZOLE 200 MG PO TABS
200.0000 mg | ORAL_TABLET | Freq: Once | ORAL | Status: AC
  Administered 2021-08-05: 200 mg via ORAL
  Filled 2021-08-05: qty 1

## 2021-08-05 MED ORDER — SODIUM CHLORIDE 0.9 % IV SOLN: 1.0000 g | Freq: Once | INTRAVENOUS | Status: DC

## 2021-08-05 MED ORDER — SODIUM CHLORIDE 0.9 % IV SOLN
1.0000 g | Freq: Once | INTRAVENOUS | Status: DC
  Filled 2021-08-05: qty 10

## 2021-08-05 MED ORDER — SODIUM CHLORIDE 0.9 % IV BOLUS
500.0000 mL | Freq: Once | INTRAVENOUS | Status: AC
  Administered 2021-08-05: 500 mL via INTRAVENOUS

## 2021-08-05 NOTE — ED Provider Notes (Addendum)
[facility] DEPT Provider Note   CSN: [id number] Arrival date & time: 08/05/21  1200     History  Chief Complaint  Patient presents with   Abdominal Pain    Christopher [name] is a 56 y.o. male.  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  Patient states pain has been worsening over the last couple days.  States his suprapubic catheter has been functioning at baseline.  States his output in his colostomy bag has been baseline.  No nausea/vomiting or fever.  He has been followed by wound care for chronic hip wound.  Home Medications Prior to Admission medications   Medication Sig Start Date End Date Taking? Authorizing Provider  albuterol (VENTOLIN HFA) 108 (90 Base) MCG/ACT inhaler INHALE 2 PUFFS BY MOUTH EVERY 6 HOURS AS NEEDED FOR WHEEZE OR SHORTNESS OF BREATH 07/07/21   Vivi Barrack, MD  Amino Acids-Protein Hydrolys (FEEDING SUPPLEMENT, PRO-STAT 64,) LIQD Take 30 mLs by mouth 3 (three) times daily with meals. 07/02/20   Vivi Barrack, MD  amoxicillin-clavulanate (AUGMENTIN) 875-125 MG tablet Take 1 tablet by mouth 2 (two) times daily. One po bid x 7 days 06/23/21   Daleen Bo, MD  ARIPiprazole (ABILIFY) 2 MG tablet TAKE 1 TABLET (2 MG TOTAL) BY MOUTH AT BEDTIME. FOR MOOD 02/01/21   Lovorn, Jinny Blossom, MD  Blood Pressure Monitoring (BLOOD PRESSURE CUFF) MISC Use daily as needed to check blood pressure. 11/05/19   Vivi Barrack, MD  ciprofloxacin (CIPRO) 500 MG tablet Take 1 tablet (500 mg total) by mouth 2 (two) times daily. 06/08/21   Golden Circle, FNP  clonazePAM (KLONOPIN) 0.5 MG tablet Take 0.5 tablets (0.25 mg total) by mouth every 6 (six) hours as needed for anxiety. 02/10/21    Lovorn, Jinny Blossom, MD  dextromethorphan-guaiFENesin (MUCINEX DM) 30-600 MG 12hr tablet Take 2 tablets by mouth 2 (two) times daily.    [provider]  famotidine (PEPCID) 40 MG tablet TAKE 1 TABLET BY MOUTH TWICE A DAY Patient taking differently: Take 40 mg by mouth 2 (two) times daily. 01/26/21   Lovorn, Jinny Blossom, MD  fentaNYL (DURAGESIC) 75 MCG/HR Place 1 patch onto the skin every other day. 07/05/21   Bayard Hugger, NP  gabapentin (NEURONTIN) 300 MG capsule Take 1 capsule (300 mg total) by mouth 3 (three) times daily. 04/30/21   Lovorn, Jinny Blossom, MD  lidocaine (LIDODERM) 5 % APPLY 3 PATCHES TO SKIN EVERY 12 HOURS AND DISCARD WITHING 12 HOURS OR AS DIRECTED 01/26/21   Lovorn, Jinny Blossom, MD  linaclotide [facility]) 145 MCG CAPS capsule Take 1 capsule (145 mcg total) by mouth daily before breakfast. 06/24/21   Vivi Barrack, MD  melatonin ([facility] MELATONIN) 3 MG TABS tablet Take 1 tablet (3 mg total) by mouth at bedtime. 11/01/19   Matcha, Beverely Pace, MD  methocarbamol (ROBAXIN) 500 MG tablet TAKE 1 TABLET BY MOUTH EVERY 6 HOURS AS NEEDED FOR MUSCLE SPASMS Patient taking differently: Take 500 mg by mouth every 6 (six) hours as needed for muscle spasms. 11/27/20   Lovorn, Jinny Blossom, MD  mirabegron ER (MYRBETRIQ) 50 MG TB24 tablet Take 50 mg by mouth daily.    [provider]  morphine (MSIR) 15 MG tablet Take 1 tablet (15 mg total) by mouth every 4 (four) hours  as needed for severe pain. 07/05/21   Bayard Hugger, NP  Multiple Vitamin (MULTIVITAMIN WITH MINERALS) TABS tablet Take 1 tablet by mouth daily. 03/13/19   Kinnie Feil, MD  naloxone [facility]) nasal spray 4 mg/0.1 mL To use if pt develops unconsciousness or confusion that family thinks is related to opioids. Patient not taking: Reported on 06/24/2021 12/30/19   Courtney Heys, MD  Nutritional Supplements (JEVITY 1.5 CAL PO) 237 mLs by Feeding Tube route 4 (four) times daily.    [provider]  oxybutynin (DITROPAN) 5 MG tablet Take 5 mg by  mouth See admin instructions. Take 5 mg by mouth three times a day and an additional 5 mg once daily as needed for urinary urgency 11/13/19   [provider]  PARoxetine (PAXIL) 40 MG tablet Take 1 tablet (40 mg total) by mouth at bedtime. 11/27/20   Lovorn, Jinny Blossom, MD  Probiotic Product (PROBIOTIC COLON SUPPORT) CAPS Take 1 capsule by mouth daily.    [provider]  promethazine (PHENERGAN) 12.5 MG tablet TAKE 1 TABLET (12.5 MG TOTAL) BY MOUTH EVERY 6 (SIX) HOURS AS NEEDED FOR NAUSEA, VOMITING OR REFRACTORY NAUSEA / VOMITING. 04/13/21   Lovorn, Jinny Blossom, MD  QUEtiapine (SEROQUEL) 25 MG tablet TAKE 1-2 TABLETS (25-50 MG TOTAL) BY MOUTH 2 (TWO) TIMES DAILY. TAKE 25 MG IN AM- AND 50-100 MG NIGHTLY AS NEEDED FOR AGITATION AND CONFUSION - DO NOT EXCEED MORE THAN 4 TABLETS PER DAY Patient taking differently: Take 25-100 mg by mouth See admin instructions. 06/08/21   Lovorn, Jinny Blossom, MD  senna-docusate (SENOKOT-S) 8.6-50 MG tablet Take 2 tablets by mouth 3 (three) times daily. 04/13/20   [provider]  traZODone (DESYREL) 100 MG tablet TAKE 1 TO 2 TABLETS BY MOUTH EVERY DAY AT BEDTIME 07/27/21   Lovorn, Jinny Blossom, MD  vancomycin (VANCOCIN) 125 MG capsule Take 1 capsule (125 mg total) by mouth in the morning and at bedtime. 06/08/21   Golden Circle, FNP      Allergies    Methadone    Review of Systems   Review of Systems  Constitutional:  Negative for fever.  Respiratory:  Negative for shortness of breath.   Cardiovascular:  Negative for chest pain.  Gastrointestinal:  Negative for abdominal pain, diarrhea and vomiting.  Genitourinary:  Positive for flank pain.  Musculoskeletal:  Positive for back pain.       + hip pain  Skin:  Negative for rash.  Neurological:  Negative for headaches.   Physical Exam Updated Vital Signs BP 118/77 (BP Location: Left Arm)    Pulse 92    Temp 97.8 F (36.6 C) (Oral)    Resp 20    Ht 6\' 2"  (1.88 m)    Wt 77.1 kg    SpO2 96%    BMI 21.83 kg/m   Physical Exam Vitals and nursing note reviewed.  Constitutional:      General: He is in acute distress.     Appearance: Normal appearance.  HENT:     Head: Normocephalic.     Mouth/Throat:     Mouth: Mucous membranes are moist.  Cardiovascular:     Rate and Rhythm: Normal rate.  Pulmonary:     Effort: Pulmonary effort is normal. No respiratory distress.  Abdominal:     Palpations: Abdomen is soft.     Tenderness: There is abdominal tenderness in the left lower quadrant. There is left CVA tenderness.  Musculoskeletal:     Comments: Left chronic hand wound  with overlying dressing, edges look clean with no purulent drainage/foul smell  Skin:    General: Skin is warm.  Neurological:     Mental Status: He is alert and oriented to person, place, and time. Mental status is at baseline.  Psychiatric:        Mood and Affect: Mood normal.    ED Results / Procedures / Treatments   Labs (all labs ordered are listed, but only abnormal results are displayed) Labs Reviewed  CBC  LIPASE, BLOOD  COMPREHENSIVE METABOLIC PANEL  URINALYSIS, ROUTINE W REFLEX MICROSCOPIC    EKG None  Radiology No results found.  Procedures Procedures    Medications Ordered in ED Medications  HYDROmorphone (DILAUDID) injection 1 mg (1 mg Intravenous Given 08/05/21 1322)  sodium chloride 0.9 % bolus 500 mL (500 mLs Intravenous New Bag/Given 08/05/21 1322)    ED Course/ Medical Decision Making/ A&P                           Medical Decision Making Amount and/or Complexity of Data Reviewed Labs: ordered. Radiology: ordered.  Risk Prescription drug management.   56 year old male who is palliative care presents emergency department for right flank pain.  History of kidney stones/bladder stone.  Left hip wound looks stable, colostomy bag looks appropriate, suprapubic catheter looks appropriate.  No white count, abdominal labs are normal, urinalysis shows indication for infection, previous  cultures have showed yeast infection.  CT the abdomen pelvis looks stable, stable bladder stone, no other kidney stone or complication.  Currently patient is pain controlled.  Plan for Diflucan based off yeast in previous urine cultures, another dose for pain control and discharge home.  Patient is on ciprofloxacin and vancomycin at home already, no need for any escalation of antibiotics.   Patient agrees with this discharge plan.  Patient at this time appears safe and stable for discharge and close outpatient follow up. Discharge plan and strict return to ED precautions discussed, patient verbalizes understanding and agreement.        Final Clinical Impression(s) / ED Diagnoses Final diagnoses:  None    Rx / DC Orders ED Discharge Orders     None         Lorelle Gibbs, DO 08/05/21 1724    Nalu Troublefield, Alvin Critchley, DO 08/05/21 1734

## 2021-08-05 NOTE — ED Triage Notes (Signed)
Patient brought in via ems from home. Patient c/o right abd/flank pain. 30mg  home morphine given without help.

## 2021-08-05 NOTE — ED Notes (Signed)
[facility] contacted for transport 

## 2021-08-05 NOTE — Discharge Instructions (Signed)
You have been seen and discharged from the emergency department.  Your work-up is baseline for you.  You were given antibiotics and medication for possible UTI.  Continue your home pain regimen.  Follow-up with your primary provider for further evaluation and further care. Take home medications as prescribed. If you have any worsening symptoms or further concerns for your health please return to an emergency department for further evaluation.

## 2021-08-05 NOTE — Telephone Encounter (Signed)
Received call from patient's wife. Patient could be heard yelling out in pain. Wife reports severe abdominal pain since last night. She has been giving pain meds and changed out his duragesic patch, he's had a bowel movement and tube is not clogged. Patient's wife is advised to call 911 and have patched evaluated in ED due abdominal pain. They are in agreement.

## 2021-08-06 ENCOUNTER — Other Ambulatory Visit: Payer: Self-pay | Admitting: Physical Medicine and Rehabilitation

## 2021-08-06 ENCOUNTER — Telehealth: Payer: Self-pay | Admitting: Family Medicine

## 2021-08-06 MED ORDER — MORPHINE SULFATE 15 MG PO TABS: 15.0000 mg | ORAL_TABLET | ORAL | 0 refills | Status: DC | PRN

## 2021-08-06 MED ORDER — FENTANYL 75 MCG/HR TD PT72: 1.0000 | MEDICATED_PATCH | TRANSDERMAL | 0 refills | Status: DC

## 2021-08-06 NOTE — Telephone Encounter (Signed)
Patient's wife notified of medications sent to pharmacy

## 2021-08-06 NOTE — Telephone Encounter (Signed)
..  Home Health Certification or Plan of Care Tracking  Is this a Certification or Plan of Care? Yes  [place]  Order Number:  [id number]  Has charge sheet been attached? Yes   Where has form been placed:  In the provider's box  Faxed to:   [phone]

## 2021-08-08 LAB — URINE CULTURE: Culture: >=100000 — AB

## 2021-08-09 ENCOUNTER — Telehealth: Payer: Self-pay | Admitting: Emergency Medicine

## 2021-08-09 NOTE — Progress Notes (Addendum)
ED Antimicrobial Stewardship Positive Culture Follow Up   Christopher [name] is an 56 y.o. male who presented to [facility] on 08/05/2021 with a chief complaint of  Chief Complaint  Patient presents with   Abdominal Pain    Recent Results (from the past 720 hour(s))  Urine Culture     Status: Abnormal   Collection Time: 08/05/21 12:30 PM   Specimen: Urine, Suprapubic  Result Value Ref Range Status   Specimen Description   Final    URINE, SUPRAPUBIC Performed at [facility], [address], [place], [place] [address]    Special Requests   Final    NONE Performed at [facility], [address], [place], [place] [address]    Culture >=100,000 COLONIES/mL ESCHERICHIA COLI (A)  Final   Report Status 08/08/2021 FINAL  Final   Organism ID, Bacteria ESCHERICHIA COLI (A)  Final      Susceptibility   Escherichia coli - MIC*    AMPICILLIN >=32 RESISTANT Resistant     CEFAZOLIN <=4 SENSITIVE Sensitive     CEFEPIME <=0.12 SENSITIVE Sensitive     CEFTRIAXONE <=0.25 SENSITIVE Sensitive     CIPROFLOXACIN >=4 RESISTANT Resistant     GENTAMICIN >=16 RESISTANT Resistant     IMIPENEM <=0.25 SENSITIVE Sensitive     NITROFURANTOIN <=16 SENSITIVE Sensitive     TRIMETH/SULFA >=320 RESISTANT Resistant     AMPICILLIN/SULBACTAM >=32 RESISTANT Resistant     PIP/TAZO 16 SENSITIVE Sensitive     * >=100,000 COLONIES/mL ESCHERICHIA COLI    []  Treated with fluconazole 200mg  po tablets, due to past history of yeast growing in the urine. Stop fluconazole.   New antibiotic prescription: Cefadroxil 1g bid x 7 days.  ED Provider: , PharmD Candidate [phone] 08/09/2021, 10:54 AM Clinical Pharmacist Monday - Friday phone -  [phone] Saturday - Sunday phone - [phone]

## 2021-08-09 NOTE — Telephone Encounter (Signed)
Post ED Visit - Positive Culture Follow-up: Successful Patient Follow-Up  Culture assessed and recommendations reviewed by:  []  , Pharm.D. []  Enzo Bi, Pharm.D., BCPS AQ-ID []  , Pharm.D., BCPS []  Celedonio Miyamoto, Pharm.D., BCPS []  [place], Garvin Fila.D., BCPS, AAHIVP []  , Pharm.D., BCPS, AAHIVP []  Georgina Pillion, PharmD, BCPS []  , PharmD, BCPS []  [place], PharmD, BCPS []  [address], PharmD  Positive urine culture  []  Patient discharged without antimicrobial prescription and treatment is now indicated []  Organism is resistant to prescribed ED discharge antimicrobial []  Patient with positive blood cultures  Changes discussed with ED provider: [facility] New antibiotic prescription stop fluconazole, start cefadroxil 1 gram po bid x 7 days Called to [facility] Lysle Pearl  Contacted patient   08/09/2021, 12:42 PM

## 2021-08-10 NOTE — Telephone Encounter (Signed)
Provider received forms and completed

## 2021-08-13 ENCOUNTER — Other Ambulatory Visit: Payer: Self-pay

## 2021-08-13 ENCOUNTER — Encounter: Payer: Self-pay | Admitting: Physical Medicine and Rehabilitation

## 2021-08-13 ENCOUNTER — Encounter
Payer: No Typology Code available for payment source | Attending: Physical Medicine and Rehabilitation | Admitting: Physical Medicine and Rehabilitation

## 2021-08-13 ENCOUNTER — Telehealth: Payer: Self-pay

## 2021-08-13 DIAGNOSIS — G894 Chronic pain syndrome: Secondary | ICD-10-CM | POA: Diagnosis not present

## 2021-08-13 DIAGNOSIS — Z5181 Encounter for therapeutic drug level monitoring: Secondary | ICD-10-CM | POA: Diagnosis not present

## 2021-08-13 DIAGNOSIS — Z79891 Long term (current) use of opiate analgesic: Secondary | ICD-10-CM | POA: Diagnosis not present

## 2021-08-13 MED ORDER — TRAZODONE HCL 100 MG PO TABS: 100.0000 mg | ORAL_TABLET | Freq: Two times a day (BID) | ORAL | 5 refills | Status: DC

## 2021-08-13 MED ORDER — QUETIAPINE FUMARATE 25 MG PO TABS: 25.0000 mg | ORAL_TABLET | Freq: Two times a day (BID) | ORAL | 1 refills | Status: DC

## 2021-08-13 MED ORDER — PAROXETINE HCL 40 MG PO TABS: 40.0000 mg | ORAL_TABLET | Freq: Every day | ORAL | 2 refills | Status: DC
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
## 2021-08-13 NOTE — Telephone Encounter (Signed)
PC SW outreached patients spouse, [name], per  [facility] NP - L. Rivers SW referral request to assess needs and discuss mental health resources/support for patient.  ? ? ?Call unsuccessful. SW LVM with contact info.  ?

## 2021-08-17 ENCOUNTER — Other Ambulatory Visit: Payer: Self-pay | Admitting: Student

## 2021-08-17 ENCOUNTER — Other Ambulatory Visit: Payer: Self-pay

## 2021-08-17 DIAGNOSIS — R531 Weakness: Secondary | ICD-10-CM

## 2021-08-17 DIAGNOSIS — F32A Depression, unspecified: Secondary | ICD-10-CM

## 2021-08-17 DIAGNOSIS — Z515 Encounter for palliative care: Secondary | ICD-10-CM

## 2021-08-17 DIAGNOSIS — F419 Anxiety disorder, unspecified: Secondary | ICD-10-CM

## 2021-08-17 DIAGNOSIS — G894 Chronic pain syndrome: Secondary | ICD-10-CM
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
## 2021-08-28 ENCOUNTER — Other Ambulatory Visit: Payer: Self-pay | Admitting: Family Medicine

## 2021-09-06 ENCOUNTER — Telehealth: Payer: Self-pay | Admitting: *Deleted

## 2021-09-06 MED ORDER — MORPHINE SULFATE 15 MG PO TABS: 15.0000 mg | ORAL_TABLET | ORAL | 0 refills | Status: DC | PRN

## 2021-09-06 MED ORDER — FENTANYL 75 MCG/HR TD PT72: 1.0000 | MEDICATED_PATCH | TRANSDERMAL | 0 refills | Status: DC

## 2021-09-06 NOTE — Telephone Encounter (Signed)
Mrs Spofford called for refills on [name].  Per PMP last fill dates were 08/09/21 and his next appt is 10/06/21. ?

## 2021-09-12 ENCOUNTER — Other Ambulatory Visit: Payer: Self-pay | Admitting: Physical Medicine and Rehabilitation

## 2021-09-22 ENCOUNTER — Ambulatory Visit (INDEPENDENT_AMBULATORY_CARE_PROVIDER_SITE_OTHER): Payer: No Typology Code available for payment source | Admitting: Family Medicine

## 2021-09-22 ENCOUNTER — Encounter: Payer: Self-pay | Admitting: Family Medicine

## 2021-09-22 VITALS — BP 118/82 | HR 80 | Temp 98.5°F | Ht 74.0 in

## 2021-09-22 DIAGNOSIS — G4700 Insomnia, unspecified: Secondary | ICD-10-CM

## 2021-09-22 DIAGNOSIS — G8921 Chronic pain due to trauma: Secondary | ICD-10-CM

## 2021-09-22 DIAGNOSIS — T402X5A Adverse effect of other opioids, initial encounter: Secondary | ICD-10-CM

## 2021-09-22 DIAGNOSIS — F321 Major depressive disorder, single episode, moderate: Secondary | ICD-10-CM | POA: Diagnosis not present

## 2021-09-22 DIAGNOSIS — G709 Myoneural disorder, unspecified: Secondary | ICD-10-CM

## 2021-09-22 DIAGNOSIS — F518 Other sleep disorders not due to a substance or known physiological condition: Secondary | ICD-10-CM | POA: Diagnosis not present

## 2021-09-22 DIAGNOSIS — K5903 Drug induced constipation: Secondary | ICD-10-CM

## 2021-09-22 DIAGNOSIS — M21619 Bunion of unspecified foot: Secondary | ICD-10-CM

## 2021-09-22 MED ORDER — LINACLOTIDE 72 MCG PO CAPS: 145.0000 ug | ORAL_CAPSULE | Freq: Every day | ORAL | 5 refills | Status: DC

## 2021-09-22 NOTE — Assessment & Plan Note (Signed)
Continue management per PMNR. ?

## 2021-09-22 NOTE — Assessment & Plan Note (Signed)
He has been seen by palliative care who is currently managing his pain. ?

## 2021-09-22 NOTE — Assessment & Plan Note (Signed)
Having side effects with current dose of Linzess.  We will decrease to 72 mcg daily as needed. ?

## 2021-09-22 NOTE — Patient Instructions (Signed)
It was very nice to see you today! ? ?We we will decrease your Linzess to 72 mcg. ? ?I will refer you to see a podiatrist for your foot deformity. ? ?Please let me know if you like to have a sleep study done. ? ?I will see you back in 3 to 6 months.  Come back to see me sooner if needed. ? ?Take care, ?Dr [name] ? ?PLEASE NOTE: ? ?If you had any lab tests please let us know if you have not heard back within a few days. You may see your results on mychart before we have a chance to review them but we will give you a call once they are reviewed by [place]. If we ordered any referrals today, please let us know if you have not heard from their office within the next week.  ? ?Please try these tips to maintain a healthy lifestyle: ? ?Eat at least 3 REAL meals and 1-2 snacks per day.  Aim for no more than 5 hours between eating.  If you eat breakfast, please do so within one hour of getting up.  ? ?Each meal should contain half fruits/vegetables, one quarter protein, and one quarter carbs (no bigger than a computer mouse) ? ?Cut down on sweet beverages. This includes juice, soda, and sweet tea.  ? ?Drink at least 1 glass of water with each meal and aim for at least 8 glasses per day ? ?Exercise at least 150 minutes every week.   ?

## 2021-09-22 NOTE — Assessment & Plan Note (Signed)
Continue management per psychiatry. 

## 2021-09-22 NOTE — Progress Notes (Signed)
? ?Christopher [name] is a 56 y.o. male who presents today for an office visit. ? ?Assessment/Plan:  ?New/Acute Problems: ?Left Great Toe Nail Pain ?Secondary to partial avulsion due to anatomic structure of his foot with developing bunion and hammertoe deformity.  We discussed putting a spacer between his first and second digit to help alleviate this.  No signs of infection today do not think he needs antibiotics.  We will place referral to podiatry to help manage his toe and bunion.  This is likely developed as a sequela of his spinal cord injury several years ago due to decreased nerve innervation to his feet. ? ?Chronic Problems Addressed Today: ?Hypnagogic jerks ?No red flags.  Possibly medication side effect.  He is also likely getting poor quality of sleep due to his chronic pain related to his chronic osteomyelitis as a result of his trauma few years ago.  No overt signs for sleep apnea.  We discussed getting a sleep study to rule out however he declined for now.  He will let me know if symptoms change. ? ?Depression, major, single episode, moderate (HCC) ?Continue management per psychiatry.  He is on Abilify 2 mg daily, Paxil 40 mg daily, trazodone 100 to 200 mg daily, and Seroquel 25 to 50 mg twice daily. ? ?Neuromuscular weakness (HCC) ?Continue management per PMNR. ? ?Chronic pain due to trauma ?He has been seen by palliative care who is currently managing his pain. ? ?Insomnia ?Continue management per psychiatry. ? ?Therapeutic opioid induced constipation ?Having side effects with current dose of Linzess.  We will decrease to 72 mcg daily as needed. ? ? ?  ?Subjective:  ?HPI: ? ?Please see A/P for status of chronic conditions.  He has a few issues he like to discuss ? ?He has had pain in his left great toe.  This has been off for the last several weeks.  Thinks he may have an ingrown toenail in the area.  Had some redness to the area.  His wife put a little antibiotic ointment to the area and the  redness seemed to improve.  Area is very painful to touch. ? ?Is also had some issues with Linzess.  They will cause profuse diarrhea whenever he takes it.  He has been taking it every other day would like to decrease the dose. ? ?He also has noticed for the last several months that he will sometimes drink as he is falling asleep.  He will then arouse and sometimes have tremors.  This only happens when he is falling asleep.  No concern for sleep apnea.  No witnessed apneic episodes.  No PND. ? ?   ?  ?Objective:  ?Physical Exam: ?BP 118/82 (BP Location: Right Arm)   Pulse 80   Temp 98.5 ?F (36.9 ?C) (Temporal)   Ht 6\' 2"  (1.88 m)   SpO2 96%   BMI 21.83 kg/m?   ?Gen: No acute distress, resting comfortably ?CV: Regular rate and rhythm with no murmurs appreciated ?Pulm: Normal work of breathing, clear to auscultation bilaterally with no crackles, wheezes, or rhonchi ?MSK: ?- Foot: Bunion noted on the left foot.  Hammertoe deformity noted as well.  Slight avulsion noted to the medial aspect of left great toenail.  No surrounding erythema. ?Neuro: In wheelchair.  Moves all extremities spontaneously. ?Psych: Normal affect and thought content ? ?Time Spent: ?45 minutes of total time was spent on the date of the encounter performing the following actions: chart review prior to seeing the patient including  his previous visits with specialists, obtaining history, performing a medically necessary exam, counseling on the treatment plan, placing orders, and documenting in our EHR.  ? ? ?   ? ?Katina Degree. [name], MD ?09/22/2021 11:58 AM  ?

## 2021-09-22 NOTE — Assessment & Plan Note (Signed)
No red flags.  Possibly medication side effect.  He is also likely getting poor quality of sleep due to his chronic pain related to his chronic osteomyelitis as a result of his trauma few years ago.  No overt signs for sleep apnea.  We discussed getting a sleep study to rule out however he declined for now.  He will let me know if symptoms change. ?

## 2021-09-22 NOTE — Assessment & Plan Note (Signed)
Continue management per psychiatry.  He is on Abilify 2 mg daily, Paxil 40 mg daily, trazodone 100 to 200 mg daily, and Seroquel 25 to 50 mg twice daily. ?

## 2021-09-28 ENCOUNTER — Other Ambulatory Visit: Payer: Self-pay | Admitting: Student

## 2021-09-29 ENCOUNTER — Ambulatory Visit (INDEPENDENT_AMBULATORY_CARE_PROVIDER_SITE_OTHER): Payer: No Typology Code available for payment source | Admitting: Family

## 2021-09-29 ENCOUNTER — Encounter: Payer: Self-pay | Admitting: Family

## 2021-09-29 ENCOUNTER — Other Ambulatory Visit: Payer: Self-pay

## 2021-09-29 VITALS — BP 105/68 | Temp 99.3°F

## 2021-09-29 DIAGNOSIS — M7918 Myalgia, other site: Secondary | ICD-10-CM

## 2021-09-29 DIAGNOSIS — M86652 Other chronic osteomyelitis, left thigh: Secondary | ICD-10-CM | POA: Diagnosis not present

## 2021-09-29 DIAGNOSIS — K5903 Drug induced constipation: Secondary | ICD-10-CM

## 2021-09-29 DIAGNOSIS — M8668 Other chronic osteomyelitis, other site: Secondary | ICD-10-CM | POA: Diagnosis not present

## 2021-09-29 DIAGNOSIS — F419 Anxiety disorder, unspecified: Secondary | ICD-10-CM

## 2021-09-29 DIAGNOSIS — T402X5A Adverse effect of other opioids, initial encounter: Secondary | ICD-10-CM

## 2021-09-29 DIAGNOSIS — Z9189 Other specified personal risk factors, not elsewhere classified: Secondary | ICD-10-CM | POA: Diagnosis not present

## 2021-09-29 DIAGNOSIS — F32A Depression, unspecified: Secondary | ICD-10-CM

## 2021-09-29 MED ORDER — CIPROFLOXACIN HCL 500 MG PO TABS: 500.0000 mg | ORAL_TABLET | Freq: Two times a day (BID) | ORAL | 11 refills | Status: DC

## 2021-09-29 MED ORDER — VANCOMYCIN HCL 125 MG PO CAPS: 125.0000 mg | ORAL_CAPSULE | Freq: Two times a day (BID) | ORAL | 11 refills | Status: DC

## 2021-09-29 NOTE — Patient Instructions (Addendum)
Nice to see you. ° °Continue to take your medication daily as prescribed. ° °Refills have been sent to the pharmacy. ° °Plan for follow up in 6 months or sooner if needed with lab work on the same day. ° °Have a great day and stay safe! ° °

## 2021-09-29 NOTE — Progress Notes (Signed)
? ?Subjective:  ? ? Patient ID: Christopher [name], male    DOB: 1965/11/02, 56 y.o.   MRN: [id number] ? ?Chief Complaint  ?Patient presents with  ? Osteomyelitis   ? ? ?HPI: ? ?Christopher [name] is a 56 y.o. 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 Here today for follow up with Christopher [name] and case Production designer, theatre/television/film.  ? ?Christopher [name] has been doing well recently with good adherence and tolerance to his ciprofloxacin and oral vancomycin. Movantik has been changed to Linzess. Wound appears to be stable with waxing and waning drainage. Working on adjusting/optimizing medication. Has had improve oral intake recently and not currently using the G-tube. Concern for non-visible tremors and phantom type pains. Christopher [name] was about to go away for a couple of days for her birthday but has been busy with home care and other medical issues. Palliative Care now following periodically to help with symptom management. Working on getting in a support group.  ? ? ?Allergies  ?Allergen Reactions  ? Methadone Other (See Comments)  ?  Hallucinations/confusion  ? ? ? ? ?Outpatient Medications Prior to Visit  ?Medication Sig Dispense Refill  ? albuterol (VENTOLIN HFA) 108 (90 Base) MCG/ACT inhaler INHALE 2 PUFFS BY MOUTH EVERY 6 HOURS AS NEEDED FOR WHEEZE OR SHORTNESS OF BREATH 18 each 1  ? Amino Acids-Protein Hydrolys (FEEDING SUPPLEMENT, PRO-STAT 64,) LIQD Take 30 mLs by mouth 3 (three) times daily with meals. 887 mL 0  ? ARIPiprazole (ABILIFY) 2 MG tablet TAKE 1 TABLET (2 MG TOTAL) BY MOUTH AT BEDTIME. FOR MOOD 90 tablet 2  ? Blood Pressure Monitoring (BLOOD PRESSURE CUFF) MISC Use daily as needed to check blood  pressure. 1 each 0  ? dextromethorphan-guaiFENesin (MUCINEX DM) 30-600 MG 12hr tablet Take 2 tablets by mouth 2 (two) times daily.    ? famotidine (PEPCID) 40 MG tablet TAKE 1 TABLET BY MOUTH TWICE A DAY (Patient taking differently: Take 40 mg by mouth 2 (two) times daily.) 180 tablet 1  ? fentaNYL (DURAGESIC) 75 MCG/HR Place 1 patch onto the skin every other day. 15 patch 0  ? gabapentin (NEURONTIN) 300 MG capsule Take 1 capsule (300 mg total) by mouth 3 (three) times daily. 90 capsule 5  ? lidocaine (LIDODERM) 5 % APPLY 3 PATCHES TO SKIN EVERY 12 HOURS AND DISCARD WITHING 12 HOURS OR AS DIRECTED 90 patch 5  ? linaclotide (LINZESS) 72 MCG capsule Take 2 capsules (145 mcg total) by mouth daily before breakfast. 30 capsule 5  ? melatonin ([employer] MELATONIN) 3 MG TABS tablet Take 1 tablet (3 mg total) by mouth at bedtime. 10 tablet 0  ? methocarbamol (ROBAXIN) 500 MG tablet TAKE 1 TABLET BY MOUTH EVERY 6 HOURS AS NEEDED FOR MUSCLE SPASMS (Patient taking differently: Take 500 mg by mouth every 6 (six) hours as needed for muscle spasms.) 100 tablet 11  ? mirabegron ER (MYRBETRIQ) 50 MG TB24 tablet Take 50 mg by mouth daily.    ? morphine (MSIR) 15 MG tablet Take 1 tablet (15 mg total) by mouth every 4 (four) hours as needed for severe pain. 150 tablet 0  ? Multiple Vitamin (MULTIVITAMIN WITH MINERALS) TABS tablet Take 1 tablet by mouth daily.    ? naloxone [facility])  nasal spray 4 mg/0.1 mL To use if pt develops unconsciousness or confusion that family thinks is related to opioids. 1 each 3  ? Nutritional Supplements (JEVITY 1.5 CAL PO) 237 mLs by Feeding Tube route 4 (four) times daily.    ? oxybutynin (DITROPAN) 5 MG tablet Take 5 mg by mouth See admin instructions. Take 5 mg by mouth three times a day and an additional 5 mg once daily as needed for urinary urgency    ? PARoxetine (PAXIL) 40 MG tablet Take 1 tablet (40 mg total) by mouth at bedtime. 90 tablet 2  ? Probiotic Product (PROBIOTIC COLON SUPPORT) CAPS Take 1  capsule by mouth daily.    ? promethazine (PHENERGAN) 12.5 MG tablet TAKE 1 TABLET (12.5 MG TOTAL) BY MOUTH EVERY 6 (SIX) HOURS AS NEEDED FOR NAUSEA, VOMITING OR REFRACTORY NAUSEA / VOMITING. 90 tablet 5  ? QUEtiapine (SEROQUEL) 25 MG tablet TAKE 1-4 TABLETS (25-100 MG) BY MOUTH 2 TIMES DAILY. FOR AGITATION/CONFUSION- 50-100 MG NIGHTLY- 720 tablet 1  ? senna-docusate (SENOKOT-S) 8.6-50 MG tablet Take 2 tablets by mouth 3 (three) times daily.    ? traZODone (DESYREL) 100 MG tablet Take 1-2 tablets (100-200 mg total) by mouth 2 (two) times daily. 120 tablet 5  ? ciprofloxacin (CIPRO) 500 MG tablet Take 1 tablet (500 mg total) by mouth 2 (two) times daily. 60 tablet 6  ? vancomycin (VANCOCIN) 125 MG capsule Take 1 capsule (125 mg total) by mouth in the morning and at bedtime. 60 capsule 6  ? clonazePAM (KLONOPIN) 0.5 MG tablet Take 0.5 tablets (0.25 mg total) by mouth every 6 (six) hours as needed for anxiety. (Patient not taking: Reported on 09/22/2021) 60 tablet 5  ? MOVANTIK 12.5 MG TABS tablet Take 12.5 mg by mouth daily. (Patient not taking: Reported on 09/22/2021)    ? ?No facility-administered medications prior to visit.  ? ? ? ?Past Medical History:  ?Diagnosis Date  ? Acute on chronic respiratory failure with hypoxia (HCC) 06/2018  ? trach removed 11-16-2018, on vent from jan until may 2020 - uses albuterol prn  ? Anxiety   ? Bacteremia due to Pseudomonas 06/2018  ? Chronic osteomyelitis (HCC)   ? Chronic pain syndrome   ? Clostridium difficile colitis 10/30/2019  ? tx with abx   ? Depression   ? DVT (deep venous thrombosis) (HCC) 2020  ? right brachial post PICC line  ? History of blood transfusion 06/2018  ? History of Clostridioides difficile colitis   ? History of kidney stones   ? Hypertension   ? norvasc d/c by pcp on 11/05/19  ? Multiple traumatic injuries   ? Penile pain 11/18/2019  ? Pneumonia 11/2009  ? 2020 x 2  ? Walker as ambulation aid   ? Wheelchair bound   ? electric  ? Wound discharge   ? left hip  wound with bloody/clear drainage change dressing q day surgilube with gauze, between legs wound using calcium algenate pad bid  ? ? ? ?Past Surgical History:  ?Procedure Laterality Date  ? APPLICATION OF A-CELL OF BACK N/A 08/06/2018  ? Procedure: Application Of A-Cell Of Back;  Surgeon: Peggye Form, DO;  Location: [facility] OR;  Service: Plastics;  Laterality: N/A;  ? APPLICATION OF A-CELL OF EXTREMITY Left 08/06/2018  ? Procedure: Application Of A-Cell Of Extremity;  Surgeon: Peggye Form, DO;  Location: [facility] OR;  Service: Plastics;  Laterality: Left;  ? APPLICATION OF A-CELL OF EXTREMITY Left 09/18/2019  ? Procedure:  APPLICATION OF A-CELL OF EXTREMITY;  Surgeon: Peggye Formillingham, Claire S, DO;  Location: [facility] OR;  Service: Plastics;  Laterality: Left;  ? APPLICATION OF WOUND VAC  07/12/2018  ? Procedure: Application Of Wound Vac to the Left Thigh and Scrotum.;  Surgeon: Roby LoftsHaddix, Kevin P, MD;  Location: [facility] OR;  Service: Orthopedics;;  ? APPLICATION OF WOUND VAC  07/10/2018  ? Procedure: Application Of Wound Vac;  Surgeon: Berna Bueonnor, Chelsea A, MD;  [facility];  Service: General;;  ? COLON SURGERY  2020  ? colostomy  ? COLOSTOMY N/A 07/23/2018  ? Procedure: COLOSTOMY;  Surgeon: Violeta Gelinashompson, Burke, MD;  [facility];  Service: General;  Laterality: N/A;  ? CYSTOSCOPY W/ URETERAL STENT PLACEMENT N/A 07/15/2018  ? Procedure: RETROGRADE URETHROGRAM;  Surgeon: Marcine Matarahlstedt, Stephen, MD;  [facility];  Service: Urology;  Laterality: N/A;  ? CYSTOSCOPY WITH LITHOLAPAXY N/A 05/06/2019  ? Procedure: CYSTOSCOPY BASKET BLADDER STONE EXTRACTION;  Surgeon: Malen GauzeMcKenzie, Patrick L, MD;  [facility];  Service: Urology;  Laterality: N/A;  30 MINS  ? CYSTOSTOMY N/A 05/06/2019  ? Procedure: REPLACEMENT OF SUPRAPUBIC CATHETER;  Surgeon: Malen GauzeMcKenzie, Patrick L, MD;  [facility];  Service: Urology;  Laterality: N/A;  ? DEBRIDEMENT AND CLOSURE WOUND Left 03/04/2019  ? Procedure: Excision of hip wound  with placement of Acell;  Surgeon: Peggye Formillingham, Claire S, DO;  Location: [facility];  Service: Plastics;  Laterality: Left;  ? ESOPHAGOGASTRODUODENOSCOPY N/A 08/14/2018  ? Procedure: ESOPHAGOGASTRODUODENOSCOPY (EGD);  Surge

## 2021-09-29 NOTE — Assessment & Plan Note (Addendum)
Christopher [name] appears to be doing well recently and is the best I have seen him in several visits. Wound waxes and wanes with no systemic symptoms. Appears to be in steady state at present and will continue with [name] dose of ciprofloxacin and oral vancomycin indefinitely. Discussed continued need for protein and the unlikelihood that this wound will heal. Continues to work with wound care and physical medicine. Will plan for follow up in 6 months or sooner if needed.  ? ?

## 2021-09-29 NOTE — Assessment & Plan Note (Signed)
Anxiety and depression are labile. Tearful at times about his current situation. Having trouble with memory at times. Working on doing more things independently. Working on getting up with Psychiatry to help with medication management and possible group therapy.  ?

## 2021-09-29 NOTE — Assessment & Plan Note (Deleted)
Mr. Cuffee appears to be doing well recently and is the best I have seen him in several visits. Wound waxes and wanes with no systemic symptoms. Appears to be in steady state at present and will continue with current dose of ciprofloxacin and oral vancomycin indefinitely. ?

## 2021-09-29 NOTE — Assessment & Plan Note (Signed)
Changed to Linzess and stable. Continue management per internal medicine.  ?

## 2021-09-29 NOTE — Assessment & Plan Note (Signed)
Suspect his pains/tremor sensations are related to phantom type pains. Continue current medications per internal medicine and physical medicine.  ?

## 2021-10-01 ENCOUNTER — Telehealth: Payer: Self-pay

## 2021-10-01 MED ORDER — MORPHINE SULFATE 15 MG PO TABS: 15.0000 mg | ORAL_TABLET | ORAL | 0 refills | Status: DC | PRN

## 2021-10-01 NOTE — Telephone Encounter (Signed)
Patient's wife called to get a refill on Morphine IR 15 mg. She stated it needed to say the new directions, which is 3 times a day instead of 4 times a day ?

## 2021-10-01 NOTE — Telephone Encounter (Signed)
Left detailed message informing spouse that medication was sent in with new directions ?

## 2021-10-06 ENCOUNTER — Encounter: Payer: Self-pay | Admitting: Physical Medicine and Rehabilitation

## 2021-10-06 ENCOUNTER — Telehealth: Payer: Self-pay | Admitting: Family Medicine

## 2021-10-06 ENCOUNTER — Encounter
Payer: No Typology Code available for payment source | Attending: Physical Medicine and Rehabilitation | Admitting: Physical Medicine and Rehabilitation

## 2021-10-06 VITALS — BP 126/86 | HR 86 | Ht 74.0 in | Wt 170.0 lb

## 2021-10-06 DIAGNOSIS — M792 Neuralgia and neuritis, unspecified: Secondary | ICD-10-CM | POA: Diagnosis present

## 2021-10-06 DIAGNOSIS — G709 Myoneural disorder, unspecified: Secondary | ICD-10-CM

## 2021-10-06 DIAGNOSIS — M8668 Other chronic osteomyelitis, other site: Secondary | ICD-10-CM | POA: Diagnosis present

## 2021-10-06 DIAGNOSIS — G8921 Chronic pain due to trauma: Secondary | ICD-10-CM

## 2021-10-06 DIAGNOSIS — T402X5A Adverse effect of other opioids, initial encounter: Secondary | ICD-10-CM

## 2021-10-06 DIAGNOSIS — K5903 Drug induced constipation: Secondary | ICD-10-CM | POA: Insufficient documentation

## 2021-10-06 MED ORDER — FENTANYL 75 MCG/HR TD PT72: 1.0000 | MEDICATED_PATCH | TRANSDERMAL | 0 refills | Status: DC

## 2021-10-06 MED ORDER — ARIPIPRAZOLE 5 MG PO TABS: 5.0000 mg | ORAL_TABLET | Freq: Every day | ORAL | 3 refills | Status: DC

## 2021-10-06 MED ORDER — TRAZODONE HCL 100 MG PO TABS: 100.0000 mg | ORAL_TABLET | Freq: Two times a day (BID) | ORAL | 5 refills | Status: DC

## 2021-10-06 NOTE — Telephone Encounter (Signed)
..  Home Health Certification or Plan of Care Tracking ? ?Is this a Certification or Plan of Care? yes ? ?HH Agency: [facility]  ? ?Order Number:  [id number] ? ?Has charge sheet been attached? yes ? ?Where has form been placed:  In provider's box ? ?Faxed to:   [phone] ?

## 2021-10-06 NOTE — Patient Instructions (Signed)
Pt is a 56 yr old male with hx of Multitrauma- causing L femoral neck fx, degloving of L hip to going/scrotum, bladder neck trauma- got SPC, developed compartment syndrome -s/p surgery for that; also diverting colostomy, skin grafts, and s/p trach and PEG- PEG is out. 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 S/P screw removal and on IV ABX for L hip osteomyelitis. 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 ? B/L tennis elbow new-and B/L pending/forming ulnar neuropathy ?Osteomyelitis of L hip found again-on  PO vanc and Cipro ? with new PEG- 7/22- and colostomy from before.  ? 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  ?Has R severe ulnar neuropathy as well.  ? ? ?Remind me that didn't increase pills for MSIR to q3 hours prn- still had 180 pills. Will need 240 pills next time.  ? ?2. Con't Paxil, will increase Abilify to 5 mg QHS for hallucinations/mood- since still having hallucinations- hopefully will help mood/irritability and hallucinations.  ? ?3.  Stopping Klonopin for now - hasn't made difference being off it.  ? ? ?4.  Reifll Durgagesic 75 mcg q2 days.  ? ?5. Refill Trazodone 50 mg in Am and 200 mg QHS.  ? ? ?6. Con't gabapentin and seroquel  ? ?7. Any ideas for Psychiatry- since [facility] has given them so many problems.  ? ? ?8. Psychiatrist- Dr Holland Falling- at [facility]. Refer to him- And see if he will take pt on.  ?And psychology-  ? ?9. F/U in 2 months- double appt.  ?

## 2021-10-06 NOTE — Progress Notes (Signed)
? ?Subjective:  ? ? Patient ID: Christopher [name], male    DOB: 1965-08-21, 56 y.o.   MRN: [id number] ? ?HPI ? 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 S/P screw removal and on IV ABX for L hip osteomyelitis. 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 ? B/L tennis elbow new-and B/L pending/forming ulnar neuropathy ?Osteomyelitis of L hip found again-on  PO vanc and Cipro ? with new PEG- 7/22- and colostomy from before.  ? 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  ?Has R severe ulnar neuropathy as well.  ? ? ?Same old; no acute changes.  ? ?Stomach issues are still an issue- back and forth-  ?Constipation is fine; stomach pain is the main issue.  ?Not losing weight- eating enough- not doing Tfs lately- eating enough- if has a day where doesn't eat at all- ~ q2-3 weeks, will do TF's if doesn't eat, but hasn't had to lately.  ? ?Hasn't given him Klonopin in weeks-  ?Hasn't noticed any difference- not more anxious off it.  ?Not more awake; some days more sleepy- when stomach bothering him.  ? ?Sleeps more when pain is bad.  ? ?Doing Trazodone- 50 mg in Am and 200 mg nightly.  ? ? ?[facility] needs to re-evaluate him again. Missed appointments, have left messages and couldn't get in no matter how hard they worked to get him in, but Floydene Flock is insisting that he "missed appointments" and so needs re-eval.  ? ?Had episode last week- throwing things, screaming; no reason- brother came over- very angry/irate- was going to send to  hospital due to anger/throwing temper tantrum-  doesn't remember what caused it.  ? ?Hasn't had one in awhile prior to this.  ? ?Lays there and "thinks" and said has "all this stuff in his head".  ? ?Feels like [name] runs outside to answer phone- and thinks she's cheating- but ?Revs him up to talk to doctors/bill collectors, school, etc in front of pt, so she tries not to.  ? ?Has some jerking episodes- when resting- upset about it.  ?More feels that he's jerking; than actually sees them per wife.  ? ?Got pain meds yesterday- MSIR- q3 hours.  ? ?  ? ? ? ? ?Pain Inventory ?Average Pain 9 ?Pain Right Now 8 ?My pain is sharp, dull, stabbing, tingling, and aching ? ?LOCATION OF PAIN  hand back buttocks groin hip ? ?BOWEL ?History of colostomy Yes  ? ? ?BLADDER ?Suprapubic ? ? ?Mobility ?use a walker ?ability to climb steps?  no ?do you drive?  no ?use a wheelchair ?needs help with transfers ? ?Function ?Do you have any goals in this area?  no ? ?Neuro/Psych ?bladder control problems ?bowel control problems ?weakness ?numbness ?tremor ?tingling ?trouble walking ?spasms ?dizziness ?confusion ?depression ?anxiety ? ?Prior Studies ?Any changes since last visit?  no ? ?Physicians involved in your care ?Any changes since last visit?  no ? ? ?Family History  ?Problem Relation Age of Onset  ?  Breast cancer Mother   ?     with mets to the bones  ? ?Social History  ? ?Socioeconomic History  ? Marital status: Married  ?  Spouse name: Not on file  ? Number of children: Not on file  ? Years of education: Not on file  ? Highest education level: Not on file  ?Occupational History  ? Occupation: Disable  ?Tobacco Use  ? Smoking status: Former  ?  Packs/day: 1.00  ?  Years: 20.00  ?  Pack years: 20.00  ?  Types: Cigarettes  ?  Quit date: 07/10/2018  ?  Years since quitting: 3.2  ? Smokeless tobacco: Never  ?Vaping Use  ? Vaping Use: Never used  ?Substance and Sexual Activity  ? Alcohol use: Never  ? Drug use: Yes  ?  Types: Oxycodone,  Fentanyl  ?  Comment: Fentanyl patch/oxycodone since 06/2018  ? Sexual activity: Yes  ?Other Topics Concern  ? Not on file  ?Social History Narrative  ? ** Merged History Encounter **  ?    ? ?Social Determinants of Health  ? ?Financial Resource Strain: Not on file  ?Food Insecurity: Not on file  ?Transportation Needs: Not on file  ?Physical Activity: Not on file  ?Stress: Not on file  ?Social Connections: Not on file  ? ?Past Surgical History:  ?Procedure Laterality Date  ? APPLICATION OF A-CELL OF BACK N/A 08/06/2018  ? Procedure: Application Of A-Cell Of Back;  Surgeon: Peggye Form, DO;  Location: [facility] OR;  Service: Plastics;  Laterality: N/A;  ? APPLICATION OF A-CELL OF EXTREMITY Left 08/06/2018  ? Procedure: Application Of A-Cell Of Extremity;  Surgeon: Peggye Form, DO;  Location: [facility] OR;  Service: Plastics;  Laterality: Left;  ? APPLICATION OF A-CELL OF EXTREMITY Left 09/18/2019  ? Procedure: APPLICATION OF A-CELL OF EXTREMITY;  Surgeon: Peggye Form, DO;  Location: [facility] OR;  Service: Plastics;  Laterality: Left;  ? APPLICATION OF WOUND VAC  07/12/2018  ? Procedure: Application Of Wound Vac to the Left Thigh and Scrotum.;  Surgeon: Roby Lofts, MD;  Location: [facility] OR;  Service: Orthopedics;;  ? APPLICATION OF WOUND VAC  07/10/2018  ? Procedure: Application Of Wound Vac;  Surgeon: Berna Bue, MD;  Location: [facility] OR;  Service: General;;  ? COLON SURGERY  2020  ? colostomy  ? COLOSTOMY N/A 07/23/2018  ? Procedure: COLOSTOMY;  Surgeon: Violeta Gelinas, MD;  Location: [facility] OR;  Service: General;  Laterality: N/A;  ? CYSTOSCOPY W/ URETERAL STENT PLACEMENT N/A 07/15/2018  ? Procedure: RETROGRADE URETHROGRAM;  Surgeon: Marcine Matar, MD;  Location: [facility] OR;  Service: Urology;  Laterality: N/A;  ? CYSTOSCOPY WITH LITHOLAPAXY N/A 05/06/2019  ? Procedure: CYSTOSCOPY BASKET BLADDER STONE EXTRACTION;  Surgeon: Malen Gauze, MD;  Location: [facility];  Service: Urology;  Laterality:  N/A;  30 MINS  ? CYSTOSTOMY N/A 05/06/2019  ? Procedure: REPLACEMENT OF SUPRAPUBIC CATHETER;  Surgeon: Malen Gauze, MD;  Location: [facility];  Service: Urology;  Laterality: N/A;  ? DEBRIDEMENT AND CLOSURE WOUND Left 03/04/2019  ? Procedure: Excision of hip wound with placement of Acell;  Surgeon: Peggye Form, DO;  Location: [facility] OR;  Service: Plastics;  Laterality: Left;  ? ESOPHAGOGASTRODUODENOSCOPY N/A 08/14/2018  ? Procedure: ESOPHAGOGASTRODUODENOSCOPY (EGD);  Surgeon: Violeta Gelinas, MD;  Location: [facility] ENDOSCOPY;  Service: General;  Laterality: N/A;  bedside  ? FACIAL RECONSTRUCTION SURGERY    ? X 2--once as a teenager  and second time in his 7330's  ? HARDWARE REMOVAL Left 03/04/2019  ? Procedure: Left Hip Hardware Removal;  Surgeon: Roby LoftsHaddix, Kevin P, MD;  Location: [facility] OR;  Service: Orthopedics;  Laterality: Left;  ? HIP PINNING,CANNULATED Left 07/12/2018  ? Procedure: CANNULATED HIP PINNING;  Surgeon: Roby LoftsHaddix, Kevin P, MD;  Location: [facility] OR;  Service: Orthopedics;  Laterality: Left;  ? HIP SURGERY    ? HOLMIUM LASER APPLICATION Right 07/15/2019  ? Procedure: HOLMIUM LASER APPLICATION;  Surgeon: Malen GauzeMcKenzie, Patrick L, MD;  [facility];  Service: Urology;  Laterality: Right;  ? I & D EXTREMITY Left 07/25/2018  ? Procedure: Debridement of buttock, scrotum and left leg, placement of acell and vac;  Surgeon: Peggye Formillingham, Claire S, DO;  Location: [facility] OR;  Service: Plastics;  Laterality: Left;  ? I & D EXTREMITY N/A 08/06/2018  ? Procedure: Debridement of buttock, scrotum and left leg;  Surgeon: Peggye Formillingham, Claire S, DO;  Location: [facility] OR;  Service: Plastics;  Laterality: N/A;  ? I & D EXTREMITY N/A 08/13/2018  ? Procedure: Debridement of buttock, scrotum and left leg, placement of acell and vac;  Surgeon: Peggye Formillingham, Claire S, DO;  Location: [facility] OR;  Service: Plastics;  Laterality: N/A;  90 min, please  ? INCISION AND DRAINAGE HIP Left 09/18/2019  ? Procedure: IRRIGATION AND  DEBRIDEMENT HIP/ PELVIS WITH WOUND VAC PLACEMENT;  Surgeon: Roby LoftsHaddix, Kevin P, MD;  Location: [facility] OR;  Service: Orthopedics;  Laterality: Left;  ? INCISION AND DRAINAGE OF WOUND N/A 07/18/2018  ? Procedure: Debri

## 2021-10-11 ENCOUNTER — Telehealth: Payer: Self-pay

## 2021-10-11 NOTE — Telephone Encounter (Signed)
LVM for pt to please call office to schedule an appt, per Dr Lavone Neri recommendations if concerned about pneumonia needs to be evaluated in the office.  ?

## 2021-10-11 NOTE — Telephone Encounter (Signed)
Spoke with patient stated not feeling good, advise need to be evaluated ASAP  ?Will go to [facility] today  ?

## 2021-10-11 NOTE — Telephone Encounter (Signed)
Yes he needs to be evaluated ASAP if he is concerned for pneumonia. ? ?Katina Degree. [name], MD ?10/11/2021 1:13 PM  ? ?

## 2021-10-11 NOTE — Telephone Encounter (Signed)
On provider's desk for completion.

## 2021-10-11 NOTE — Telephone Encounter (Signed)
Would you like me to cal the pt to schedule a VV? Please advise  ?

## 2021-10-11 NOTE — Telephone Encounter (Signed)
States patient had fever 4/30.  States fever went away today.  States patient is wheezing and has cough.    Is concerned he could be developing pneumonia.    States patient would not be able to come into the office to be evaluated.

## 2021-10-13 ENCOUNTER — Other Ambulatory Visit: Payer: Self-pay | Admitting: Physical Medicine and Rehabilitation

## 2021-10-15 ENCOUNTER — Telehealth: Payer: Self-pay | Admitting: *Deleted

## 2021-10-15 NOTE — Telephone Encounter (Signed)
[facility] urine drug screen is consistent for medications. Fentanyl is not present -reported patch was placed 10/05/21. Level may have been below cutoff. ?

## 2021-10-19 ENCOUNTER — Telehealth: Payer: Self-pay

## 2021-10-19 ENCOUNTER — Encounter (HOSPITAL_BASED_OUTPATIENT_CLINIC_OR_DEPARTMENT_OTHER): Payer: PRIVATE HEALTH INSURANCE | Admitting: Internal Medicine

## 2021-10-19 NOTE — Telephone Encounter (Signed)
error 

## 2021-10-26 ENCOUNTER — Encounter (HOSPITAL_BASED_OUTPATIENT_CLINIC_OR_DEPARTMENT_OTHER): Payer: Self-pay | Admitting: Internal Medicine

## 2021-10-29 ENCOUNTER — Telehealth: Payer: Self-pay

## 2021-10-29 MED ORDER — MORPHINE SULFATE 15 MG PO TABS
15.0000 mg | ORAL_TABLET | ORAL | 0 refills | Status: DC | PRN
Start: 2021-10-29 — End: 2021-11-17

## 2021-10-29 NOTE — Telephone Encounter (Signed)
Dr Berline Chough Note was Reviewed.  PMP was Reviewed.  Spoke with [name], MSIR e-scribed today, she verbalizes understanding.  [facility] called, they have the Abilify prescription from last month, they are waiting on PA from [employer]. Placed a call to [facility], regarding the above, she verbalizes understanding.

## 2021-10-29 NOTE — Telephone Encounter (Signed)
DR. Berline Chough IS OUT OF THE OFFICE THIS WEEK. PLEASE REVIEW:  Mr. Godeaux will be out of his Morphine IR  15 mg this weekend.  He will also need the Abilify refilled.   Filled  Written  ID  Drug  QTY  Days  Prescriber  RX #  Dispenser  Refill  Daily Dose*  Pymt Type  PMP  10/06/2021 10/06/2021 1  Fentanyl 75 Mcg/hr Patch 15.00 30 Me Lov [id number] Nor ([id number]) 0/0 270.00 M[facility] [place] 10/05/2021 10/01/2021 1  Morphine Sulfate Ir 15 Mg Tab 180.00 30 Me Lov [id number] Nor ([id number]) 0/0 90.00 MME Worker's Comp [place]

## 2021-11-01 ENCOUNTER — Other Ambulatory Visit: Payer: Self-pay | Admitting: Physical Medicine and Rehabilitation

## 2021-11-04 ENCOUNTER — Telehealth: Payer: Self-pay

## 2021-11-04 NOTE — Telephone Encounter (Signed)
Patient's wife is calling to get refill on Fentanyl 75 mcg patches. Per PMP, last fill was 10/06/21

## 2021-11-05 MED ORDER — FENTANYL 75 MCG/HR TD PT72: 1.0000 | MEDICATED_PATCH | TRANSDERMAL | 0 refills | Status: DC

## 2021-11-05 NOTE — Telephone Encounter (Signed)
Left voicemail to inform her that Fentanyl patches were sent to the pharmacy

## 2021-11-05 NOTE — Addendum Note (Signed)
Addended by: Genice Rouge on: 11/05/2021 09:57 AM   Modules accepted: Orders

## 2021-11-09 ENCOUNTER — Other Ambulatory Visit: Payer: Self-pay | Admitting: Physical Medicine and Rehabilitation

## 2021-11-09 ENCOUNTER — Ambulatory Visit (HOSPITAL_BASED_OUTPATIENT_CLINIC_OR_DEPARTMENT_OTHER): Payer: Self-pay | Admitting: Internal Medicine

## 2021-11-17 ENCOUNTER — Encounter: Payer: Self-pay | Admitting: Physical Medicine and Rehabilitation

## 2021-11-17 ENCOUNTER — Encounter
Payer: No Typology Code available for payment source | Attending: Physical Medicine and Rehabilitation | Admitting: Physical Medicine and Rehabilitation

## 2021-11-17 VITALS — BP 109/72 | HR 89 | Ht 74.0 in

## 2021-11-17 DIAGNOSIS — M8669 Other chronic osteomyelitis, multiple sites: Secondary | ICD-10-CM | POA: Diagnosis present

## 2021-11-17 DIAGNOSIS — M792 Neuralgia and neuritis, unspecified: Secondary | ICD-10-CM | POA: Diagnosis present

## 2021-11-17 DIAGNOSIS — G8921 Chronic pain due to trauma: Secondary | ICD-10-CM | POA: Diagnosis present

## 2021-11-17 DIAGNOSIS — F321 Major depressive disorder, single episode, moderate: Secondary | ICD-10-CM | POA: Diagnosis present

## 2021-11-17 MED ORDER — MORPHINE SULFATE 15 MG PO TABS: 15.0000 mg | ORAL_TABLET | ORAL | 0 refills | Status: DC | PRN

## 2021-11-17 NOTE — Progress Notes (Signed)
Subjective:    Patient ID: Christopher [name], male    DOB: 10-16-1965, 56 y.o.   MRN: [id number]  HPI  Pt is a 56 yr old male with hx of Multitrauma- causing L femoral neck fx, degloving of L hip to going/scrotum, bladder neck trauma- got SPC, developed compartment syndrome -s/p surgery for that; also diverting colostomy, skin grafts, and s/p trach and PEG-  Also has moderate to severe protein-calorie malnutrition, anxiety due to multitrauma, and chronic pain. S/P screw removal and on IV ABX for L hip osteomyelitis. 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  Has R severe ulnar neuropathy as well.     Got 113 pills on 5/19- but didn't having anymore.   Asking if can go up on pain meds. Doesn't feel like anything is working.   Feels like taking candy-  Has good days and bad days- same all along.   Has appointment late June/early July- going to see a psychiatrist in [place] that [employer] comp found him.  Hasn't really noticed a difference with trazodone 50 mg in AM.   Goes to sleep and wakes up hurting, per pt.   Getting up and sitting in w/c- last few days.   Eating better, sitting up more; not screaming as much anymore.  Pt reports theres no difference with pain meds. But when questioned further, admits it helps some, but doesn't get 100% better.         Pain Inventory Average Pain 9 Pain Right Now 9 My pain is constant, sharp, burning, dull, stabbing, tingling, and aching  LOCATION OF PAIN  wrist hand fingers back buttocks groin hip thigh knee leg toes  BOWEL History of colostomy Yes     BLADDER Suprapubic  Mobility use a walker use a wheelchair needs help with transfers  Function Do you have any goals in this area?  no  Neuro/Psych numbness tremor tingling trouble walking spasms dizziness confusion depression anxiety  Prior Studies Any changes since last visit?  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  no   Family History  Problem Relation Age of Onset   Breast cancer Mother        with mets to the bones   Social History   Socioeconomic History   Marital status: Married    Spouse name: Not on file   Number of children: Not on file   Years of education: Not on file   Highest education level: Not on file  Occupational History   Occupation: Disable  Tobacco Use   Smoking status: Former    Packs/day: 1.00    Years: 20.00    Pack years: 20.00    Types: Cigarettes    Quit date: 07/10/2018    Years since quitting: 3.3   Smokeless tobacco: Never  Vaping Use   Vaping Use: Never used  Substance and Sexual Activity   Alcohol use: Never   Drug use: Yes    Types: Oxycodone, Fentanyl    Comment: Fentanyl patch/oxycodone since 06/2018  Sexual activity: Yes  Other Topics Concern   Not on file  Social History Narrative   ** Merged History Encounter **       Social Determinants of Health   Financial Resource Strain: Not on file  Food Insecurity: Not on file  Transportation Needs: Not on file  Physical Activity: Not on file  Stress: Not on file  Social Connections: Not on file   Past Surgical History:  Procedure Laterality Date   APPLICATION OF A-CELL OF BACK N/A 08/06/2018   Procedure: Application Of A-Cell Of Back;  Surgeon: [name], DO;  Location: [place];  Service: Plastics;  Laterality: N/A;   APPLICATION OF A-CELL OF EXTREMITY Left 08/06/2018   Procedure: Application Of A-Cell Of Extremity;  Surgeon: [name], DO;  Location: [facility];  Service: Plastics;   Laterality: Left;   APPLICATION OF A-CELL OF EXTREMITY Left 09/18/2019   Procedure: APPLICATION OF A-CELL OF EXTREMITY;  Surgeon: [name], DO;  Location: [facility];  Service: Plastics;  Laterality: Left;   APPLICATION OF WOUND VAC  07/12/2018   Procedure: Application Of Wound Vac to the Left Thigh and Scrotum.;  Surgeon: Shona Needles, MD;  Location: [facility];  Service: Orthopedics;;   APPLICATION OF WOUND VAC  07/10/2018   Procedure: Application Of Wound Vac;  Surgeon: Clovis Riley, MD;  Location: [place];  Service: General;;   COLON SURGERY  2020   colostomy   COLOSTOMY N/A 07/23/2018   Procedure: COLOSTOMY;  Surgeon: Georganna Skeans, MD;  Location: [facility];  Service: General;  Laterality: N/A;   CYSTOSCOPY W/ URETERAL STENT PLACEMENT N/A 07/15/2018   Procedure: RETROGRADE URETHROGRAM;  Surgeon: Franchot Gallo, MD;  Location: [place];  Service: Urology;  Laterality: N/A;   CYSTOSCOPY WITH LITHOLAPAXY N/A 05/06/2019   Procedure: CYSTOSCOPY BASKET BLADDER STONE EXTRACTION;  Surgeon: Cleon Gustin, MD;  Location[facility];  Service: Urology;  Laterality: N/A;  30 MINS   CYSTOSTOMY N/A 05/06/2019   Procedure: REPLACEMENT OF SUPRAPUBIC CATHETER;  Surgeon: Cleon Gustin, MD;  Location: [facility];  Service: Urology;  Laterality: N/A;   DEBRIDEMENT AND CLOSURE WOUND Left 03/04/2019   Procedure: Excision of hip wound with placement of Acell;  Surgeon: [name], DO;  Location: [place];  Service: Plastics;  Laterality: Left;   ESOPHAGOGASTRODUODENOSCOPY N/A 08/14/2018   Procedure: ESOPHAGOGASTRODUODENOSCOPY (EGD);  Surgeon: Georganna Skeans, MD;  Location: [place];  Service: General;  Laterality: N/A;  bedside   FACIAL RECONSTRUCTION SURGERY     X 2--once as a teenager and second time in his 60's   HARDWARE REMOVAL Left 03/04/2019   Procedure: Left Hip Hardware Removal;  Surgeon: Shona Needles, MD;  Location: [place];  Service: Orthopedics;   Laterality: Left;   HIP PINNING,CANNULATED Left 07/12/2018   Procedure: CANNULATED HIP PINNING;  Surgeon: Shona Needles, MD;  Location: [place];  Service: Orthopedics;  Laterality: Left;   HIP SURGERY     HOLMIUM LASER APPLICATION Right [id number]   Procedure: HOLMIUM LASER APPLICATION;  Surgeon: Cleon Gustin, MD;  Location: [facility];  Service: Urology;  Laterality: Right;   I & D EXTREMITY Left 07/25/2018   Procedure: Debridement of buttock, scrotum and left leg, placement of acell and vac;  Surgeon: [name], DO;  Location: [place];  Service: Plastics;  Laterality: Left;   I & D EXTREMITY N/A 08/06/2018   Procedure: Debridement of buttock, scrotum  and left leg;  Surgeon: [name], DO;  Location: [place];  Service: Plastics;  Laterality: N/A;   I & D EXTREMITY N/A 08/13/2018   Procedure: Debridement of buttock, scrotum and left leg, placement of acell and vac;  Surgeon: [name], DO;  Location: [place];  Service: Plastics;  Laterality: N/A;  90 min, please   INCISION AND DRAINAGE HIP Left 09/18/2019   Procedure: IRRIGATION AND DEBRIDEMENT HIP/ PELVIS WITH WOUND VAC PLACEMENT;  Surgeon: Shona Needles, MD;  Location: [place];  Service: Orthopedics;  Laterality: Left;   INCISION AND DRAINAGE OF WOUND N/A 07/18/2018   Procedure: Debridement of left leg, buttocks and scrotal wound with placement of acell and Flexiseal;  Surgeon: [name], DO;  Location: [place];  Service: Plastics;  Laterality: N/A;   INCISION AND DRAINAGE OF WOUND Left 08/29/2018   Procedure: Debridement of buttock, scrotum and left leg, placement of acell and vac;  Surgeon: [name], DO;  Location: [place];  Service: Plastics;  Laterality: Left;  75 min, please   INCISION AND DRAINAGE OF WOUND Bilateral 10/23/2018   Procedure: DEBRIDEMENT OF BUTTOCK,SCROTUM, AND LEG WOUNDS WITH PLACEMENT OF ACELL- BILATERAL 90 MIN;  Surgeon: [name], DO;  Location: [place];   Service: Plastics;  Laterality: Bilateral;   IR ANGIOGRAM PELVIS SELECTIVE OR SUPRASELECTIVE  07/10/2018   IR ANGIOGRAM PELVIS SELECTIVE OR SUPRASELECTIVE  07/10/2018   IR ANGIOGRAM SELECTIVE EACH ADDITIONAL VESSEL  07/10/2018   IR EMBO ART  VEN HEMORR LYMPH EXTRAV  INC GUIDE ROADMAPPING  07/10/2018   IR GASTROSTOMY TUBE MOD SED  01/21/2021   IR NEPHROSTOMY PLACEMENT LEFT  04/05/2019   IR NEPHROSTOMY PLACEMENT RIGHT  05/31/2019   IR US GUIDE BX ASP/DRAIN  07/10/2018   IR US GUIDE VASC ACCESS RIGHT  07/10/2018   IR VENO/EXT/UNI LEFT  07/10/2018   IRRIGATION AND DEBRIDEMENT OF WOUND WITH SPLIT THICKNESS SKIN GRAFT Left 09/19/2018   Procedure: Debridement of gluteal wound with placement of acell to left leg.;  Surgeon: [name], DO;  Location: [place];  Service: Plastics;  Laterality: Left;  2.5 hours, please   LAPAROTOMY N/A 07/12/2018   Procedure: EXPLORATORY LAPAROTOMY;  Surgeon: Georganna Skeans, MD;  Location: [place];  Service: General;  Laterality: N/A;   LAPAROTOMY N/A 07/15/2018   Procedure: WOUND EXPLORATION; CLOSURE OF ABDOMEN;  Surgeon: Georganna Skeans, MD;  Location: [place];  Service: General;  Laterality: N/A;   LAPAROTOMY  07/10/2018   Procedure: Exploratory Laparotomy;  Surgeon: Clovis Riley, MD;  Location: [place];  Service: General;;   MASS EXCISION Left 09/18/2019   Procedure: EXCISION UPPER LEFT INNER THIGH WOUND;  Surgeon: [name], DO;  Location: [place];  Service: Plastics;  Laterality: Left;   NEPHROLITHOTOMY Right 07/15/2019   Procedure: NEPHROLITHOTOMY PERCUTANEOUS;  Surgeon: Cleon Gustin, MD;  Location: [facility];  Service: Urology;  Laterality: Right;  90 MINS   PEG PLACEMENT N/A 08/14/2018   Procedure: PERCUTANEOUS ENDOSCOPIC GASTROSTOMY (PEG) PLACEMENT;  Surgeon: Georganna Skeans, MD;  Location: [place];  Service: General;  Laterality: N/A;   PERCUTANEOUS TRACHEOSTOMY N/A 08/02/2018   Procedure: PERCUTANEOUS TRACHEOSTOMY;  Surgeon:  Georganna Skeans, MD;  Location: [place];  Service: General;  Laterality: N/A;   RADIOLOGY WITH ANESTHESIA N/A 07/10/2018   Procedure: IR WITH ANESTHESIA;  Surgeon: Sandi Mariscal, MD;  Location: [place];  Service: Radiology;  Laterality: N/A;   RADIOLOGY WITH ANESTHESIA Right 07/10/2018  Procedure: Ir With Anesthesia;  Surgeon: Sandi Mariscal, MD;  Location: [place];  Service: Radiology;  Laterality: Right;   SCROTAL EXPLORATION N/A 07/15/2018   Procedure: SCROTUM DEBRIDEMENT;  Surgeon: Franchot Gallo, MD;  Location: [place];  Service: Urology;  Laterality: N/A;   SHOULDER SURGERY     SKIN SPLIT GRAFT Right 09/19/2018   Procedure: Skin Graft Split Thickness;  Surgeon: [name], DO;  Location: [place];  Service: Plastics;  Laterality: Right;   SKIN SPLIT GRAFT N/A 10/03/2018   Procedure: Split thickness skin graft to gluteal area with acell placement;  Surgeon: [name], DO;  Location: [place];  Service: Plastics;  Laterality: N/A;  3 hours, please   VACUUM ASSISTED CLOSURE CHANGE N/A 07/12/2018   Procedure: ABDOMINAL VACUUM ASSISTED CLOSURE CHANGE and abdominal washout;  Surgeon: Georganna Skeans, MD;  Location: [place];  Service: General;  Laterality: N/A;   WOUND DEBRIDEMENT Left 07/23/2018   Procedure: DEBRIDEMENT LEFT BUTTOCK  WOUND;  Surgeon: Georganna Skeans, MD;  Location: [place];  Service: General;  Laterality: Left;   WOUND EXPLORATION Left 07/10/2018   Procedure: WOUND EXPLORATION LEFT GROIN;  Surgeon: Clovis Riley, MD;  Location: [facility] [place] [facility];  Service: General;  Laterality: Left;   Past Medical History:  Diagnosis Date   Acute on chronic respiratory failure with hypoxia ([place]) 06/2018   trach removed 11-16-2018, on vent from [place] until may 2020 - uses albuterol prn   Anxiety    Bacteremia due to Pseudomonas 06/2018   Chronic osteomyelitis ([place])    Chronic pain syndrome    Clostridium difficile colitis 10/30/2019   tx with abx    Depression    DVT (deep venous thrombosis) ([place]) 2020    right brachial post PICC line   History of blood transfusion 06/2018   History of Clostridioides difficile colitis    History of kidney stones    Hypertension    norvasc d/c by pcp on 11/05/19   Multiple traumatic injuries    Penile pain 11/18/2019   Pneumonia 11/2009   2020 x 2   Walker as ambulation aid    Wheelchair bound    electric   Wound discharge    left hip wound with bloody/clear drainage change dressing q day surgilube with gauze, between legs wound using calcium algenate pad bid   BP 109/72   Pulse 89   Ht 6\' 2"  (1.88 m)   SpO2 95%   BMI 21.83 kg/m   Opioid Risk Score:   Fall Risk Score:  `1  Depression screen [facility] 2/9     11/17/2021   11:30 AM 10/06/2021   11:28 AM 08/13/2021   11:33 AM 07/05/2021    2:39 PM 05/28/2021    3:00 PM 05/10/2021   10:30 AM 03/29/2021   10:16 AM  Depression screen PHQ 2/9  Decreased Interest 1 1 1  0 1 1 1   Down, Depressed, Hopeless 1 1 1  0 1 1 1   PHQ - 2 Score 2 2 2  0 2 2 2      Review of Systems  Constitutional: Negative.   HENT: Negative.    Eyes: Negative.   Respiratory: Negative.    Cardiovascular: Negative.   Gastrointestinal: Negative.   Endocrine: Negative.   Genitourinary: Negative.   Musculoskeletal:  Positive for back pain, gait problem and myalgias.       Spasms  Skin: Negative.   Allergic/Immunologic: Negative.   Neurological:  Positive for dizziness, weakness and numbness.  Tingling  Hematological: Negative.   Psychiatric/Behavioral:  Positive for confusion and dysphoric mood. The patient is nervous/anxious.   All other systems reviewed and are negative.     Objective:   Physical Exam Awake, but crying hysterically at times, NAD In power /wc- on O2 by [place]        Assessment & Plan:   Pt is a 56 yr old male with hx of Multitrauma- causing L femoral neck fx, degloving of L hip to going/scrotum, bladder neck trauma- got SPC, developed compartment syndrome -s/p surgery for that; also diverting  colostomy, skin grafts, and s/p trach and PEG- PEG is out. 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 S/P screw removal and on IV ABX for L hip osteomyelitis. 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  Has R severe ulnar neuropathy as well.    Will refill MSIR since only got 113 tabs last month on 5/19- 15 mg q3 hours prn- #240-was recommended by palliative care.    2. Con't Duragesic  75 mcg q2 days for pain.    3. Sees Dr Luetta Nutting 11/29/21- and hopefully will refer for psychology.    4. I think we need to discuss weaning pain meds due to a hypersensitivity reaction to opiates.   5. Need to work on biofeedback- techniques to get pain better controlled.    6. F/U in 6 weeks as scheduled    I spent a total of  35  minutes on total care today- >50% coordination of care- due to prolonged discussion about how pt keeps asking for more pain meds and I'm concerned about his behavior- because he says he's never "more comfortable" but that doesn't fit what I hear from [name]- his wife.

## 2021-11-17 NOTE — Patient Instructions (Signed)
Pt is a 56 yr old male with hx of Multitrauma- causing L femoral neck fx, degloving of L hip to going/scrotum, bladder neck trauma- got SPC, developed compartment syndrome -s/p surgery for that; also diverting colostomy, skin grafts, and s/p trach and PEG- PEG is out. 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 S/P screw removal and on IV ABX for L hip osteomyelitis. 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  Has R severe ulnar neuropathy as well.    Will refill MSIR since only got 113 tabs last month on 5/19- 15 mg q3 hours prn- #240-was recommended by palliative care.    2. Con't Duragesic  75 mcg q2 days for pain.    3. Sees Dr Luetta Nutting 11/29/21- and hopefully will refer for psychology.    4. I think we need to discuss weaning pain meds due to a hypersensitivity reaction to opiates.   5. Need to work on biofeedback- techniques to get pain better controlled.    6. F/U in 6 weeks as scheduled

## 2021-11-18 ENCOUNTER — Other Ambulatory Visit: Payer: Self-pay | Admitting: *Deleted

## 2021-11-18 MED ORDER — ALBUTEROL SULFATE HFA 108 (90 BASE) MCG/ACT IN AERS: INHALATION_SPRAY | RESPIRATORY_TRACT | 1 refills | Status: DC

## 2021-11-20 ENCOUNTER — Encounter (HOSPITAL_BASED_OUTPATIENT_CLINIC_OR_DEPARTMENT_OTHER): Payer: Self-pay | Admitting: Internal Medicine

## 2021-11-22 ENCOUNTER — Ambulatory Visit (HOSPITAL_BASED_OUTPATIENT_CLINIC_OR_DEPARTMENT_OTHER): Payer: Self-pay | Admitting: Internal Medicine

## 2021-11-23 ENCOUNTER — Other Ambulatory Visit: Payer: Self-pay | Admitting: Physical Medicine and Rehabilitation

## 2021-11-24 ENCOUNTER — Ambulatory Visit (INDEPENDENT_AMBULATORY_CARE_PROVIDER_SITE_OTHER): Payer: PRIVATE HEALTH INSURANCE | Admitting: Internal Medicine

## 2021-11-24 ENCOUNTER — Encounter: Payer: Self-pay | Admitting: Internal Medicine

## 2021-11-24 VITALS — BP 120/90 | HR 97 | Temp 97.9°F | Ht 75.0 in | Wt 165.0 lb

## 2021-11-24 DIAGNOSIS — Z87891 Personal history of nicotine dependence: Secondary | ICD-10-CM | POA: Diagnosis not present

## 2021-11-24 DIAGNOSIS — J9611 Chronic respiratory failure with hypoxia: Secondary | ICD-10-CM | POA: Diagnosis not present

## 2021-11-24 DIAGNOSIS — R0602 Shortness of breath: Secondary | ICD-10-CM | POA: Diagnosis not present

## 2021-11-24 MED ORDER — ANORO ELLIPTA 62.5-25 MCG/ACT IN AEPB: 1.0000 | INHALATION_SPRAY | Freq: Every day | RESPIRATORY_TRACT | 5 refills | Status: DC

## 2021-11-24 NOTE — Patient Instructions (Addendum)
Please schedule follow up scheduled with myself in 3 months.  If my schedule is not open yet, we will contact you with a reminder closer to that time. Please call [phone] if you haven't heard from [place] a month before.   Before your next visit I would like you to have: Spirometry/DLCO - next available  Start anoro inhaler after your breathing testing is done. 1 puff once a day. This is a medication for COPD. We will see if it helps your breathing and hopefully you won't need the albuterol as much.  You can continue the albuterol inhaler as needed and the nebulizer treatments.

## 2021-11-24 NOTE — Progress Notes (Signed)
Christopher [name]    [id number]    January 15, 1966  Primary Care Physician:Parker, Katina Degree, MD Date of Appointment: 11/24/2021 Established Patient Visit  Chief complaint:   Chief Complaint  Patient presents with   Follow-up    No concerns.      HPI: Christopher [name] is a 56 y.o. 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 Has chronic aspiration and is on nebulized saline and flutter valve for this. He has also had a PEG tube placed and undergone SLP. On chronic 3LNC.  Interval Updates: Here for follow up. On chronic 3LNC. No interval pneumonia or bronchitis.  Still has PEG tube but doesn't use it much and it's just patent and flushed. Denie any issue with aspiration. Using nebulizer treatments about once a week. Taking albuterol three times/day. Usually if he is laying down or with exertion. Takes this for cough and shortness of breath.    DME company [facility].     I have reviewed the patient's family social and past medical history and updated as appropriate.   Past Medical History:  Diagnosis Date   Acute on chronic respiratory failure with hypoxia (HCC) 06/2018   trach removed 11-16-2018, on vent from jan until may 2020 - uses albuterol prn   Anxiety    Bacteremia due to Pseudomonas 06/2018   Chronic osteomyelitis (HCC)    Chronic pain syndrome    Clostridium difficile colitis 10/30/2019   tx with abx    Depression    DVT (deep venous thrombosis) (HCC) 2020   right brachial post PICC line   History of blood transfusion 06/2018   History of Clostridioides difficile colitis    History of kidney stones    Hypertension    norvasc d/c by pcp on 11/05/19   Multiple traumatic injuries    Penile pain 11/18/2019   Pneumonia 11/2009   2020 x 2   Walker as ambulation aid    Wheelchair bound    electric   Wound discharge    left hip wound  with bloody/clear drainage change dressing q day surgilube with gauze, between legs wound using calcium algenate pad bid    Past Surgical History:  Procedure Laterality Date   APPLICATION OF A-CELL OF BACK N/A 08/06/2018   Procedure: Application Of A-Cell Of Back;  Surgeon: Peggye Form, DO;  Location: [facility] OR;  Service: Plastics;  Laterality: N/A;   APPLICATION OF A-CELL OF EXTREMITY Left 08/06/2018   Procedure: Application Of A-Cell Of Extremity;  Surgeon: Peggye Form, DO;  Location: [facility] OR;  Service: Plastics;  Laterality: Left;   APPLICATION OF A-CELL OF EXTREMITY Left 09/18/2019   Procedure: APPLICATION OF A-CELL OF EXTREMITY;  Surgeon: Peggye Form, DO;  Location: [facility];  Service: Plastics;  Laterality: Left;   APPLICATION OF WOUND VAC  07/12/2018   Procedure: Application Of Wound Vac to the Left Thigh and Scrotum.;  Surgeon: Roby Lofts, MD;  Location: [facility] OR;  Service: Orthopedics;;   APPLICATION OF WOUND VAC  07/10/2018   Procedure: Application Of Wound Vac;  Surgeon: Berna Bue, MD;  Location: [facility] OR;  Service: General;;   COLON SURGERY  2020   colostomy   COLOSTOMY N/A 07/23/2018   Procedure: COLOSTOMY;  Surgeon: Violeta Gelinas, MD;  Location: [facility] OR;  Service: General;  Laterality: N/A;   CYSTOSCOPY  W/ URETERAL STENT PLACEMENT N/A 07/15/2018   Procedure: RETROGRADE URETHROGRAM;  Surgeon: Marcine Matar, MD;  Location: [facility] OR;  Service: Urology;  Laterality: N/A;   CYSTOSCOPY WITH LITHOLAPAXY N/A 05/06/2019   Procedure: CYSTOSCOPY BASKET BLADDER STONE EXTRACTION;  Surgeon: Malen Gauze, MD;  Location: [facility];  Service: Urology;  Laterality: N/A;  30 MINS   CYSTOSTOMY N/A 05/06/2019   Procedure: REPLACEMENT OF SUPRAPUBIC CATHETER;  Surgeon: Malen Gauze, MD;  Location: [facility];  Service: Urology;  Laterality: N/A;   DEBRIDEMENT AND CLOSURE WOUND Left 03/04/2019   Procedure: Excision of hip wound with  placement of Acell;  Surgeon: Peggye Form, DO;  [facility]: [facility];  Service: Plastics;  Laterality: Left;   ESOPHAGOGASTRODUODENOSCOPY N/A 08/14/2018   Procedure: ESOPHAGOGASTRODUODENOSCOPY (EGD);  Surgeon: Violeta Gelinas, MD;  Location: [facility] ENDOSCOPY;  Service: General;  Laterality: N/A;  bedside   FACIAL RECONSTRUCTION SURGERY     X 2--once as a teenager and second time in his 63's   HARDWARE REMOVAL Left 03/04/2019   Procedure: Left Hip Hardware Removal;  Surgeon: Roby Lofts, MD;  [facility]: [facility];  Service: Orthopedics;  Laterality: Left;   HIP PINNING,CANNULATED Left 07/12/2018   Procedure: CANNULATED HIP PINNING;  Surgeon: Roby Lofts, MD;  [facility]: [facility];  Service: Orthopedics;  Laterality: Left;   HIP SURGERY     HOLMIUM LASER APPLICATION Right 07/15/2019   Procedure: HOLMIUM LASER APPLICATION;  Surgeon: Malen Gauze, MD;  Location: [facility];  Service: Urology;  Laterality: Right;   I & D EXTREMITY Left 07/25/2018   Procedure: Debridement of buttock, scrotum and left leg, placement of acell and vac;  Surgeon: Peggye Form, DO;  [facility]: [facility];  Service: Plastics;  Laterality: Left;   I & D EXTREMITY N/A 08/06/2018   Procedure: Debridement of buttock, scrotum and left leg;  Surgeon: Peggye Form, DO;  [facility];  Service: Plastics;  Laterality: N/A;   I & D EXTREMITY N/A 08/13/2018   Procedure: Debridement of buttock, scrotum and left leg, placement of acell and vac;  Surgeon: Peggye Form, DO;  Location: [facility];  Service: Plastics;  Laterality: N/A;  90 min, please   INCISION AND DRAINAGE HIP Left 09/18/2019   Procedure: IRRIGATION AND DEBRIDEMENT HIP/ PELVIS WITH WOUND VAC PLACEMENT;  Surgeon: Roby Lofts, MD;  Location: [facility];  Service: Orthopedics;  Laterality: Left;   INCISION AND DRAINAGE OF WOUND N/A 07/18/2018   Procedure: Debridement of left leg, buttocks and scrotal wound with placement of acell and Flexiseal;   Surgeon: Peggye Form, DO;  [facility]: [facility];  Service: Plastics;  Laterality: N/A;   INCISION AND DRAINAGE OF WOUND Left 08/29/2018   Procedure: Debridement of buttock, scrotum and left leg, placement of acell and vac;  Surgeon: Peggye Form, DO;  [facility]: [facility];  Service: Plastics;  Laterality: Left;  75 min, please   INCISION AND DRAINAGE OF WOUND Bilateral 10/23/2018   Procedure: DEBRIDEMENT OF BUTTOCK,SCROTUM, AND LEG WOUNDS WITH PLACEMENT OF ACELL- BILATERAL 90 MIN;  Surgeon: Peggye Form, DO;  Location: [facility];  Service: Plastics;  Laterality: Bilateral;   IR ANGIOGRAM PELVIS SELECTIVE OR SUPRASELECTIVE  07/10/2018   IR ANGIOGRAM PELVIS SELECTIVE OR SUPRASELECTIVE  07/10/2018   IR ANGIOGRAM SELECTIVE EACH ADDITIONAL VESSEL  07/10/2018   IR EMBO ART  VEN HEMORR LYMPH EXTRAV  INC GUIDE ROADMAPPING  07/10/2018   IR GASTROSTOMY TUBE  MOD SED  01/21/2021   IR NEPHROSTOMY PLACEMENT LEFT  04/05/2019   IR NEPHROSTOMY PLACEMENT RIGHT  05/31/2019   IR US GUIDE BX ASP/DRAIN  07/10/2018   IR US GUIDE VASC ACCESS RIGHT  07/10/2018   IR VENO/EXT/UNI LEFT  07/10/2018   IRRIGATION AND DEBRIDEMENT OF WOUND WITH SPLIT THICKNESS SKIN GRAFT Left 09/19/2018   Procedure: Debridement of gluteal wound with placement of acell to left leg.;  Surgeon: Peggye Form, DO;  [facility]: [facility];  Service: Plastics;  Laterality: Left;  2.5 hours, please   LAPAROTOMY N/A 07/12/2018   Procedure: EXPLORATORY LAPAROTOMY;  Surgeon: Violeta Gelinas, MD;  Location: [facility] OR;  Service: General;  Laterality: N/A;   LAPAROTOMY N/A 07/15/2018   Procedure: WOUND EXPLORATION; CLOSURE OF ABDOMEN;  Surgeon: Violeta Gelinas, MD;  Location: [facility] OR;  Service: General;  Laterality: N/A;   LAPAROTOMY  07/10/2018   Procedure: Exploratory Laparotomy;  Surgeon: Berna Bue, MD;  Location: [facility] OR;  Service: General;;   MASS EXCISION Left 09/18/2019   Procedure: EXCISION UPPER LEFT INNER THIGH WOUND;  Surgeon: Peggye Form,  DO;  [facility];  Service: Plastics;  Laterality: Left;   NEPHROLITHOTOMY Right 07/15/2019   Procedure: NEPHROLITHOTOMY PERCUTANEOUS;  Surgeon: Malen Gauze, MD;  Location: [facility];  Service: Urology;  Laterality: Right;  90 MINS   PEG PLACEMENT N/A 08/14/2018   Procedure: PERCUTANEOUS ENDOSCOPIC GASTROSTOMY (PEG) PLACEMENT;  Surgeon: Violeta Gelinas, MD;  Location: [facility] ENDOSCOPY;  Service: General;  Laterality: N/A;   PERCUTANEOUS TRACHEOSTOMY N/A 08/02/2018   Procedure: PERCUTANEOUS TRACHEOSTOMY;  Surgeon: Violeta Gelinas, MD;  Location: [facility] OR;  Service: General;  Laterality: N/A;   RADIOLOGY WITH ANESTHESIA N/A 07/10/2018   Procedure: IR WITH ANESTHESIA;  Surgeon: Simonne Come, MD;  Location: [facility] OR;  Service: Radiology;  Laterality: N/A;   RADIOLOGY WITH ANESTHESIA Right 07/10/2018   Procedure: Ir With Anesthesia;  Surgeon: Simonne Come, MD;  Location: [facility] OR;  Service: Radiology;  Laterality: Right;   SCROTAL EXPLORATION N/A 07/15/2018   Procedure: SCROTUM DEBRIDEMENT;  Surgeon: Marcine Matar, MD;  Location: [facility] OR;  Service: Urology;  Laterality: N/A;   SHOULDER SURGERY     SKIN SPLIT GRAFT Right 09/19/2018   Procedure: Skin Graft Split Thickness;  Surgeon: Peggye Form, DO;  Location[facility];  Service: Plastics;  Laterality: Right;   SKIN SPLIT GRAFT N/A 10/03/2018   Procedure: Split thickness skin graft to gluteal area with acell placement;  Surgeon: Peggye Form, DO;  Location: [facility] OR;  Service: Plastics;  Laterality: N/A;  3 hours, please   VACUUM ASSISTED CLOSURE CHANGE N/A 07/12/2018   Procedure: ABDOMINAL VACUUM ASSISTED CLOSURE CHANGE and abdominal washout;  Surgeon: Violeta Gelinas, MD;  Location: [facility] OR;  Service: General;  Laterality: N/A;   WOUND DEBRIDEMENT Left 07/23/2018   Procedure: DEBRIDEMENT LEFT BUTTOCK  WOUND;  Surgeon: Violeta Gelinas, MD;  Location: [facility] OR;  Service: General;  Laterality: Left;   WOUND EXPLORATION Left 07/10/2018    Procedure: WOUND EXPLORATION LEFT GROIN;  Surgeon: Berna Bue, MD;  Location: [facility] OR;  Service: General;  Laterality: Left;    Family History  Problem Relation Age of Onset   Breast cancer Mother        with mets to the bones    Social History   Occupational History   Occupation: Disable  Tobacco Use   Smoking status: Former    Packs/day: 1.00    Years: 20.00  Total pack years: 20.00    Types: Cigarettes    Quit date: 07/10/2018    Years since quitting: 3.3   Smokeless tobacco: Never  Vaping Use   Vaping Use: Never used  Substance and Sexual Activity   Alcohol use: Never   Drug use: Yes    Types: Oxycodone, Fentanyl    Comment: Fentanyl patch/oxycodone since 06/2018   Sexual activity: Yes     Physical Exam: Blood pressure 120/90, pulse 97, temperature 97.9 F (36.6 C), temperature source Oral, height 6\' 3"  (1.905 m), weight 165 lb (74.8 kg), SpO2 94 %.  Gen:      NAD, in motorized wheelchair, chronically ill appearing.  ENT:  no nasal polyps, mucus membranes moist, on nasal cannula Lungs:   no increased work of breathing, no wheezes or crackles CV:         RRR no mrg  Data Reviewed: Imaging: I have personally reviewed the CT A/P June 2022 with Chronic Osteoemylitis of the sacrum. Lower chest with atelectasis and trace left sided effusion.  PFTs:      No data to display           Labs: Lab Results  Component Value Date   WBC 10.5 08/05/2021   HGB 14.0 08/05/2021   HCT 43.5 08/05/2021   MCV 95.2 08/05/2021   PLT 270 08/05/2021   Lab Results  Component Value Date   NA 137 08/05/2021   K 3.9 08/05/2021   CL 100 08/05/2021   CO2 26 08/05/2021    Immunization status: Immunization History  Administered Date(s) Administered   Influenza,inj,Quad PF,6+ Mos 03/12/2019, 03/12/2020, 05/25/2021   PFIZER(Purple Top)SARS-COV-2 Vaccination 09/28/2019, 10/22/2019   Pneumococcal Conjugate-13 10/27/2020   Tdap 10/31/2011, 02/22/2017    External  Records Personally Reviewed: PCP, PMR, palliative care  Assessment:  Chronic respiratory failure Chronic recurrent aspiration - clinically improved Neuromuscular Weakness History of tobacco use Shortness of breath, progressing with increasing inhaler use  Plan/Recommendations: Start anoro inhaler  Will obtain pfts to screen for COPD, has radiographic emphysema Continue albuterol and nebulizer treatments  Return to Care: Return in about 3 months (around 02/24/2022).   Durel SaltsNikita Urias Sheek, MD Pulmonary and Critical Care Medicine [facility] Office:[phone]

## 2021-11-26 ENCOUNTER — Encounter (HOSPITAL_BASED_OUTPATIENT_CLINIC_OR_DEPARTMENT_OTHER): Payer: No Typology Code available for payment source | Attending: Internal Medicine | Admitting: Internal Medicine

## 2021-11-26 DIAGNOSIS — X58XXXA Exposure to other specified factors, initial encounter: Secondary | ICD-10-CM | POA: Insufficient documentation

## 2021-11-26 DIAGNOSIS — S71002A Unspecified open wound, left hip, initial encounter: Secondary | ICD-10-CM | POA: Insufficient documentation

## 2021-11-26 DIAGNOSIS — M8630 Chronic multifocal osteomyelitis, unspecified site: Secondary | ICD-10-CM | POA: Insufficient documentation

## 2021-11-26 NOTE — Progress Notes (Signed)
Christopher [name] ([id number]) Visit Report for 11/26/2021 Arrival Information Details Patient Name: Date of Service: Christopher [name] MES B. 11/26/2021 10:45 A M Medical Record Number: [id number] Patient Account Number: [id number] Date of Birth/Sex: Treating RN: June 14, 1965 (56 y.o. Christopher [name], Christopher [name] Primary Care Berlinda Farve: Christopher [name] Other Clinician: Referring Tavien Chestnut: Treating Sylvanna Burggraf/Extender: Gretchen Portela in Treatment: 35 Visit Information History Since Last Visit Added or deleted any medications: No Patient Arrived: Wheel Chair Any new allergies or adverse reactions: No Arrival Time: 10:42 Had a fall or experienced change in No Accompanied By: wife activities of daily living that may affect Transfer Assistance: Manual risk of falls: Patient Identification Verified: Yes Signs or symptoms of abuse/neglect since last visito No Secondary Verification Process Completed: Yes Hospitalized since last visit: No Patient Requires Transmission-Based Precautions: No Implantable device outside of the clinic excluding No Patient Has Alerts: No cellular tissue based products placed in the center since last visit: Has Dressing in Place as Prescribed: Yes Pain Present Now: Yes Electronic Signature(s) Signed: 11/26/2021 11:45:44 AM By: Thayer Dallas Entered By: Thayer Dallas on 11/26/2021 10:42:56 -------------------------------------------------------------------------------- Clinic Level of Care Assessment Details Patient Name: Date of Service: Christopher [name] MES B. 11/26/2021 10:45 A M Medical Record Number: [id number] Patient Account Number: [id number] Date of Birth/Sex: Treating RN: May 03, 1966 (56 y.o. Christopher [name], Christopher [name] Primary Care Christopher [name]: Christopher [name] Other Clinician: Referring Christopher [name]: Treating Christopher [name]/Extender: Gretchen Portela in Treatment: 35 Clinic Level of Care Assessment Items TOOL 4 Quantity Score X- 1 0 Use  when only an EandM is performed on FOLLOW-UP visit ASSESSMENTS - Nursing Assessment / Reassessment X- 1 10 Reassessment of Co-morbidities (includes updates in patient status) X- 1 5 Reassessment of Adherence to Treatment Plan ASSESSMENTS - Wound and Skin A ssessment / Reassessment X - Simple Wound Assessment / Reassessment - one wound 1 5 []  - 0 Complex Wound Assessment / Reassessment - multiple wounds []  - 0 Dermatologic / Skin Assessment (not related to wound area) ASSESSMENTS - Focused Assessment []  - 0 Circumferential Edema Measurements - multi extremities []  - 0 Nutritional Assessment / Counseling / Intervention []  - 0 Lower Extremity Assessment (monofilament, tuning fork, pulses) []  - 0 Peripheral Arterial Disease Assessment (using hand held doppler) ASSESSMENTS - Ostomy and/or Continence Assessment and Care []  - 0 Incontinence Assessment and Management []  - 0 Ostomy Care Assessment and Management (repouching, etc.) PROCESS - Coordination of Care X - Simple Patient / Family Education for ongoing care 1 15 []  - 0 Complex (extensive) Patient / Family Education for ongoing care X- 1 10 Staff obtains Consents, Records, T Results / Process Orders est X- 1 10 Staff telephones HHA, Nursing Homes / Clarify orders / etc []  - 0 Routine Transfer to another Facility (non-emergent condition) []  - 0 Routine Hospital Admission (non-emergent condition) []  - 0 New Admissions / / Ordering NPWT Apligraf, etc. , []  - 0 Emergency Hospital Admission (emergent condition) X- 1 10 Simple Discharge Coordination []  - 0 Complex (extensive) Discharge Coordination PROCESS - Special Needs []  - 0 Pediatric / Minor Patient Management []  - 0 Isolation Patient Management []  - 0 Hearing / Language / Visual special needs []  - 0 Assessment of Community assistance (transportation, D/C planning, etc.) []  - 0 Additional assistance / Altered mentation []  - 0 Support  Surface(s) Assessment (bed, cushion, seat, etc.) INTERVENTIONS - Wound Cleansing / Measurement X - Simple Wound Cleansing - one wound 1 5 []  - 0 Complex Wound Cleansing -  multiple wounds X- 1 5 Wound Imaging (photographs - any number of wounds) []  - 0 Wound Tracing (instead of photographs) X- 1 5 Simple Wound Measurement - one wound []  - 0 Complex Wound Measurement - multiple wounds INTERVENTIONS - Wound Dressings X - Small Wound Dressing one or multiple wounds 1 10 []  - 0 Medium Wound Dressing one or multiple wounds []  - 0 Large Wound Dressing one or multiple wounds X- 1 5 Application of Medications - topical []  - 0 Application of Medications - injection INTERVENTIONS - Miscellaneous []  - 0 External ear exam []  - 0 Specimen Collection (cultures, biopsies, blood, body fluids, etc.) []  - 0 Specimen(s) / Culture(s) sent or taken to Lab for analysis []  - 0 Patient Transfer (multiple staff / / Similar devices) []  - 0 Simple Staple / Suture removal (25 or less) []  - 0 Complex Staple / Suture removal (26 or more) []  - 0 Hypo / Hyperglycemic Management (close monitor of Blood Glucose) []  - 0 Ankle / Brachial Index (ABI) - do not check if billed separately X- 1 5 Vital Signs Has the patient been seen at the hospital within the last three years: Yes Total Score: 100 Level Of Care: New/Established - Level 3 Electronic Signature(s) Signed: 11/26/2021 12:21:07 PM By: RN Entered By: on 11/26/2021 11:05:23 -------------------------------------------------------------------------------- Encounter Discharge Information Details Patient Name: Date of Service: , MES B. 11/26/2021 10:45 A M Medical Record Number: Patient Account Number: Nurse, adult Date of Birth/Sex: Treating RN: [birth year] (56 y.o. , Christopher [name] Primary Care Christopher [name]: Other Clinician: Referring Kahlee Metivier: Treating  Christopher [name]/Extender: 11/28/2021 in Treatment: 35 Encounter Discharge Information Items Discharge Condition: Stable Ambulatory Status: Wheelchair Discharge Destination: Home Transportation: Private Auto Accompanied By: wife and case manager Schedule Follow-up Appointment: Yes Clinical Summary of Care: Patient Declined Electronic Signature(s) Signed: 11/26/2021 12:21:07 PM By: Fonnie Mu RN Entered By: 11/28/2021 on 11/26/2021 11:41:23 -------------------------------------------------------------------------------- Lower Extremity Assessment Details Patient Name: Date of Service: [name] MES B. 11/26/2021 10:45 A M Medical Record Number: [id number] Patient Account Number: [id number] Date of Birth/Sex: Treating RN: [birth year] (56 y.o. Christopher [name], Christopher [name] Primary Care Serapio Edelson: Christopher [name] Other Clinician: Referring Aashish Hamm: Treating Ruari Duggan/Extender: Gretchen Portela Weeks in Treatment: 35 Electronic Signature(s) Signed: 11/26/2021 11:45:44 AM By: Fonnie Mu Signed: 11/26/2021 12:21:07 PM By: 11/28/2021 RN Entered By: Christopher [name] on 11/26/2021 10:43:35 -------------------------------------------------------------------------------- Multi-Disciplinary Care Plan Details Patient Name: Date of Service: [id number], [id number] MES B. 11/26/2021 10:45 A M Medical Record Number: [id number] Patient Account Number: Christopher [name] Date of Birth/Sex: Treating RN: 1965/11/25 (56 y.o. 11/28/2021 Primary Care Angus Amini: Thayer Dallas Other Clinician: Referring Clarkson Rosselli: Treating Ferris Fielden/Extender: 11/28/2021 Weeks in Treatment: 35 Active Inactive Electronic Signature(s) Signed: 11/26/2021 12:21:07 PM By: Thayer Dallas RN Entered By: 11/28/2021 on 11/26/2021 11:40:14 -------------------------------------------------------------------------------- Pain Assessment Details Patient  Name: Date of Service: [name], 11/28/2021 MES B. 11/26/2021 10:45 A M Medical Record Number: [id number] Patient Account Number: 09/29/1965 Date of Birth/Sex: Treating RN: [birth year] (56 y.o. Christopher [name] Primary Care Estellar Cadena: Rulon Sera Other Clinician: Referring Laporche Martelle: Treating Wise Fees/Extender: 11/28/2021 in Treatment: 35 Active Problems Location of Pain Severity and Description of Pain Patient Has Paino Yes Site Locations Pain Location: Generalized Pain, Pain in Ulcers Rate the pain. Current Pain Level: 7 Pain Management and Medication Current Pain Management: Electronic Signature(s) Signed: 11/26/2021 11:45:44 AM By: Fonnie Mu Signed: 11/26/2021  12:21:07 PM By: Fonnie Mu RN Entered By: Thayer Dallas on 11/26/2021 10:43:28 -------------------------------------------------------------------------------- Patient/Caregiver Education Details Patient Name: Date of Service: Christopher [name] MES B. 6/16/2023andnbsp10:45 A M Medical Record Number: [id number] Patient Account Number: [id number] Date of Birth/Gender: Treating RN: 1966-01-19 (56 y.o. [name] Primary Care Physician: Christopher [name] Other Clinician: Referring Physician: Treating Physician/Extender: Gretchen Portela in Treatment: 35 Education Assessment Education Provided To: Patient Education Topics Provided Wound/Skin Impairment: Methods: Explain/Verbal Responses: Reinforcements needed, State content correctly Electronic Signature(s) Signed: 11/26/2021 12:21:07 PM By: Fonnie Mu RN Entered By: Fonnie Mu on 11/26/2021 11:04:41 -------------------------------------------------------------------------------- Wound Assessment Details Patient Name: Date of Service: Christopher [name] MES B. 11/26/2021 10:45 A M Medical Record Number: [id number] Patient Account Number: [id number] Date of Birth/Sex: Treating RN: Oct 16, 1965  (56 y.o. Christopher [name], Christopher [name] Primary Care Saga Balthazar: Christopher [name] Other Clinician: Referring Wilkes Potvin: Treating Tara Rud/Extender: Rulon Sera Weeks in Treatment: 35 Wound Status Wound Number: 1 Primary Etiology: Trauma, Other Wound Location: Left Trochanter Wound Status: Open Wounding Event: Trauma Comorbid History: Hypertension, Osteomyelitis Date Acquired: 07/10/2018 Weeks Of Treatment: 35 Clustered Wound: No Photos Wound Measurements Length: (cm) 0.4 Width: (cm) 1.2 Depth: (cm) 3 Area: (cm) 0.377 Volume: (cm) 1.131 % Reduction in Area: 20% % Reduction in Volume: 40% Epithelialization: Small (1-33%) Tunneling: No Undermining: No Wound Description Classification: Full Thickness Without Exposed Support Structures Wound Margin: Distinct, outline attached Exudate Amount: Medium Exudate Type: Serosanguineous Exudate Color: red, brown Foul Odor After Cleansing: No Slough/Fibrino No Wound Bed Granulation Amount: Large (67-100%) Exposed Structure Granulation Quality: Red, Pink Fascia Exposed: No Necrotic Amount: None Present (0%) Fat Layer (Subcutaneous Tissue) Exposed: Yes Tendon Exposed: No Muscle Exposed: No Joint Exposed: No Bone Exposed: No Treatment Notes Wound #1 (Trochanter) Wound Laterality: Left Cleanser Wound Cleanser Discharge Instruction: Cleanse the wound with wound cleanser prior to applying a clean dressing using gauze sponges, not tissue or cotton balls. Peri-Wound Care Skin Prep Discharge Instruction: Use skin prep as directed Topical Primary Dressing Iodoform packing strip 1/2 (in) Discharge Instruction: Lightly pack as instructed Secondary Dressing Woven Gauze Sponge, Non-Sterile 4x4 in Discharge Instruction: Apply over primary dressing as directed. Zetuvit Plus Silicone Border Dressing 4x4 (in/in) Discharge Instruction: Apply silicone border over primary dressing as directed. Secured With Compression  Wrap Compression Stockings Add-Ons Electronic Signature(s) Signed: 11/26/2021 11:45:44 AM By: Thayer Dallas Signed: 11/26/2021 12:21:07 PM By: Fonnie Mu RN Entered By: Thayer Dallas on 11/26/2021 10:52:19 -------------------------------------------------------------------------------- Vitals Details Patient Name: Date of Service: [name], [name] MES B. 11/26/2021 10:45 A M Medical Record Number: [id number] Patient Account Number: [id number] Date of Birth/Sex: Treating RN: 03-20-1966 (56 y.o. Christopher [name], Christopher [name] Primary Care Nakira Litzau: Christopher [name] Other Clinician: Referring Emmaleah Meroney: Treating Daun Rens/Extender: Gretchen Portela in Treatment: 35 Vital Signs Time Taken: 10:42 Temperature (F): 98.1 Height (in): 75 Pulse (bpm): 106 Weight (lbs): 160 Respiratory Rate (breaths/min): 18 Body Mass Index (BMI): 20 Blood Pressure (mmHg): 113/79 Reference Range: 80 - 120 mg / dl Electronic Signature(s) Signed: 11/26/2021 11:45:44 AM By: Thayer Dallas Entered By: Thayer Dallas on 11/26/2021 10:43:16

## 2021-11-30 ENCOUNTER — Other Ambulatory Visit: Payer: Self-pay | Admitting: Family Medicine
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
## 2021-12-06 ENCOUNTER — Encounter: Payer: Self-pay | Admitting: Podiatry

## 2021-12-06 ENCOUNTER — Ambulatory Visit (INDEPENDENT_AMBULATORY_CARE_PROVIDER_SITE_OTHER): Payer: No Typology Code available for payment source | Admitting: Podiatry

## 2021-12-06 DIAGNOSIS — M2040 Other hammer toe(s) (acquired), unspecified foot: Secondary | ICD-10-CM

## 2021-12-06 DIAGNOSIS — M792 Neuralgia and neuritis, unspecified: Secondary | ICD-10-CM

## 2021-12-06 DIAGNOSIS — L600 Ingrowing nail: Secondary | ICD-10-CM

## 2021-12-06 DIAGNOSIS — M21612 Bunion of left foot: Secondary | ICD-10-CM

## 2021-12-06 DIAGNOSIS — M21611 Bunion of right foot: Secondary | ICD-10-CM | POA: Diagnosis not present
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
## 2021-12-07 ENCOUNTER — Other Ambulatory Visit: Payer: Self-pay | Admitting: Physical Medicine and Rehabilitation

## 2021-12-15 ENCOUNTER — Other Ambulatory Visit: Payer: Self-pay

## 2021-12-15 MED ORDER — FENTANYL 75 MCG/HR TD PT72: 1.0000 | MEDICATED_PATCH | TRANSDERMAL | 0 refills | Status: DC

## 2021-12-15 MED ORDER — MORPHINE SULFATE 15 MG PO TABS: 15.0000 mg | ORAL_TABLET | ORAL | 0 refills | Status: DC | PRN

## 2021-12-15 NOTE — Telephone Encounter (Signed)
Filled  Written  ID  Drug  QTY  Days  Prescriber  RX #  Dispenser  Refill  Daily Dose*  Pymt Type  PMP  11/17/2021 11/17/2021 1  Morphine Sulfate Ir 15 Mg Tab 240.00 30 Me Lov [id number] Nor (9825) 0/0 120.00 MME Worker's Comp [place] 11/09/2021 11/05/2021 1  Fentanyl 75 Mcg/hr Patch 15.00 30 Me Lov [id number] Nor (9825) 0/0 270.00 MME Worker's Comp 

## 2021-12-16 ENCOUNTER — Ambulatory Visit (INDEPENDENT_AMBULATORY_CARE_PROVIDER_SITE_OTHER): Payer: PRIVATE HEALTH INSURANCE | Admitting: Internal Medicine

## 2021-12-16 DIAGNOSIS — R0602 Shortness of breath: Secondary | ICD-10-CM | POA: Diagnosis not present

## 2021-12-16 DIAGNOSIS — J9611 Chronic respiratory failure with hypoxia: Secondary | ICD-10-CM

## 2021-12-16 LAB — PULMONARY FUNCTION TEST
DL/VA % pred: 73 %
DL/VA: 3.14 ml/min/mmHg/L
DLCO cor % pred: 40 %
DLCO cor: 12.55 ml/min/mmHg
DLCO unc % pred: 40 %
DLCO unc: 12.55 ml/min/mmHg
FEF 25-75 Pre: 1.46 L/sec
FEF2575-%Pred-Pre: 41 %
FEV1-%Pred-Pre: 43 %
FEV1-Pre: 1.8 L
FEV1FVC-%Pred-Pre: 83 %
FEV6-%Pred-Pre: 53 %
FEV6-Pre: 2.81 L
FEV6FVC-%Pred-Pre: 103 %
FVC-%Pred-Pre: 51 %
FVC-Pre: 2.81 L
Pre FEV1/FVC ratio: 64 %
Pre FEV6/FVC Ratio: 100 %

## 2021-12-16 NOTE — Progress Notes (Signed)
Spirometry and DLCO Performed Today.  

## 2021-12-16 NOTE — Patient Instructions (Signed)
Spirometry and DLCO Performed Today.  

## 2021-12-21 ENCOUNTER — Telehealth: Payer: Self-pay | Admitting: Family Medicine

## 2021-12-21 NOTE — Telephone Encounter (Signed)
..  Home Health Certification or Plan of Care Tracking  Is this a Certification or Plan of Care? Yes  HH Agency: [facility]  Order Number:  [id number]  Has charge sheet been attached? yes  Where has form been placed:  In provider's box  Faxed to:   [phone]

## 2021-12-23 DIAGNOSIS — E43 Unspecified severe protein-calorie malnutrition: Secondary | ICD-10-CM

## 2021-12-23 DIAGNOSIS — L89152 Pressure ulcer of sacral region, stage 2: Secondary | ICD-10-CM

## 2021-12-23 DIAGNOSIS — Z87440 Personal history of urinary (tract) infections: Secondary | ICD-10-CM

## 2021-12-23 DIAGNOSIS — I10 Essential (primary) hypertension: Secondary | ICD-10-CM

## 2021-12-23 DIAGNOSIS — I700 Atherosclerosis of aorta: Secondary | ICD-10-CM

## 2021-12-23 DIAGNOSIS — Z435 Encounter for attention to cystostomy: Secondary | ICD-10-CM

## 2021-12-23 DIAGNOSIS — T50905D Adverse effect of unspecified drugs, medicaments and biological substances, subsequent encounter: Secondary | ICD-10-CM

## 2021-12-23 DIAGNOSIS — G8220 Paraplegia, unspecified: Secondary | ICD-10-CM

## 2021-12-23 DIAGNOSIS — Z993 Dependence on wheelchair: Secondary | ICD-10-CM

## 2021-12-23 DIAGNOSIS — R1310 Dysphagia, unspecified: Secondary | ICD-10-CM

## 2021-12-23 DIAGNOSIS — Z79899 Other long term (current) drug therapy: Secondary | ICD-10-CM

## 2021-12-23 DIAGNOSIS — Z87891 Personal history of nicotine dependence: Secondary | ICD-10-CM

## 2021-12-23 DIAGNOSIS — Z79891 Long term (current) use of opiate analgesic: Secondary | ICD-10-CM

## 2021-12-23 DIAGNOSIS — Z466 Encounter for fitting and adjustment of urinary device: Secondary | ICD-10-CM

## 2021-12-23 DIAGNOSIS — R339 Retention of urine, unspecified: Secondary | ICD-10-CM

## 2021-12-23 DIAGNOSIS — F1120 Opioid dependence, uncomplicated: Secondary | ICD-10-CM

## 2021-12-23 DIAGNOSIS — M86652 Other chronic osteomyelitis, left thigh: Secondary | ICD-10-CM

## 2021-12-23 DIAGNOSIS — Z9981 Dependence on supplemental oxygen: Secondary | ICD-10-CM

## 2021-12-23 DIAGNOSIS — G4700 Insomnia, unspecified: Secondary | ICD-10-CM

## 2021-12-23 DIAGNOSIS — Z933 Colostomy status: Secondary | ICD-10-CM

## 2021-12-23 DIAGNOSIS — G894 Chronic pain syndrome: Secondary | ICD-10-CM

## 2021-12-23 DIAGNOSIS — F32A Depression, unspecified: Secondary | ICD-10-CM

## 2021-12-23 DIAGNOSIS — J9611 Chronic respiratory failure with hypoxia: Secondary | ICD-10-CM

## 2021-12-23 DIAGNOSIS — F419 Anxiety disorder, unspecified: Secondary | ICD-10-CM

## 2021-12-23 DIAGNOSIS — K219 Gastro-esophageal reflux disease without esophagitis: Secondary | ICD-10-CM

## 2021-12-23 DIAGNOSIS — E876 Hypokalemia: Secondary | ICD-10-CM

## 2021-12-23 DIAGNOSIS — K5903 Drug induced constipation: Secondary | ICD-10-CM

## 2021-12-23 DIAGNOSIS — D649 Anemia, unspecified: Secondary | ICD-10-CM

## 2021-12-23 NOTE — Telephone Encounter (Signed)
Form placed in PCP office to be sign 

## 2021-12-30 ENCOUNTER — Other Ambulatory Visit: Payer: Self-pay | Admitting: Physical Medicine and Rehabilitation

## 2021-12-30 ENCOUNTER — Other Ambulatory Visit: Payer: Self-pay

## 2021-12-30 DIAGNOSIS — Z515 Encounter for palliative care: Secondary | ICD-10-CM

## 2021-12-30 NOTE — Progress Notes (Signed)
PATIENT NAME: Christopher [name] DOB: 05-17-1966 MRN: [id number]  PRIMARY CARE PROVIDER: Ardith Dark, MD  RESPONSIBLE PARTY:  Acct ID - Guarantor Home Phone Work Phone Relationship Acct Type  [id number] - [name],* [id number]  Self P/F     [address], [place], [place] [address]   I connected with  [name] on 12/30/21 by telephone verified that I am speaking with the correct person using two identifiers.   I discussed the limitations of evaluation and management by telemedicine. The patient expressed understanding and agreed to proceed.   Connected with wife [name] to complete a telephonic check and schedule a follow up visit the Palliative Care NP.    Pain:  Continues with Duragesic patch routinely and morphine prn q 3 hours.   Patient is now being followed by [facility] in [place] who is also has a provider under psych.  Provider is also assisting with anxiety.  Skin Breakdown:  Left hip wound remains unchanged.  No longer being followed by the wound clinic but has a hh RN that sees patient weekly.  Family continues to address wound care daily.  Follow Up:  Telehealth visit scheduled with [name], NP for 01/11/22 @ 930 am.    CODE STATUS: Full ADVANCED DIRECTIVES: No MOST FORM: No PPS: 40%         Truitt Merle, RN

## 2021-12-31 ENCOUNTER — Telehealth: Payer: Self-pay

## 2021-12-31 NOTE — Telephone Encounter (Signed)
PA request for Promethazine 12.5 MG. Request faxed to Laretta Bolster [facility]), phone [phone], fax [phone].

## 2022-01-05 ENCOUNTER — Telehealth: Payer: Self-pay | Admitting: Student

## 2022-01-05 NOTE — Telephone Encounter (Signed)
Message left to reschedule upcoming palliative appointment scheduled for 01/11/22 d/t provider being out of office. Awaiting return call.

## 2022-01-11 ENCOUNTER — Telehealth: Payer: Self-pay | Admitting: Student

## 2022-01-12 ENCOUNTER — Encounter
Payer: No Typology Code available for payment source | Attending: Physical Medicine and Rehabilitation | Admitting: Physical Medicine and Rehabilitation

## 2022-01-12 ENCOUNTER — Encounter: Payer: Self-pay | Admitting: Physical Medicine and Rehabilitation

## 2022-01-12 VITALS — BP 129/86 | HR 96 | Ht 75.0 in

## 2022-01-12 DIAGNOSIS — K5903 Drug induced constipation: Secondary | ICD-10-CM | POA: Diagnosis present

## 2022-01-12 DIAGNOSIS — M8668 Other chronic osteomyelitis, other site: Secondary | ICD-10-CM | POA: Insufficient documentation

## 2022-01-12 DIAGNOSIS — R413 Other amnesia: Secondary | ICD-10-CM | POA: Insufficient documentation

## 2022-01-12 DIAGNOSIS — G8921 Chronic pain due to trauma: Secondary | ICD-10-CM | POA: Insufficient documentation

## 2022-01-12 DIAGNOSIS — T402X5A Adverse effect of other opioids, initial encounter: Secondary | ICD-10-CM | POA: Diagnosis present

## 2022-01-12 DIAGNOSIS — Z993 Dependence on wheelchair: Secondary | ICD-10-CM | POA: Insufficient documentation

## 2022-01-12 DIAGNOSIS — G709 Myoneural disorder, unspecified: Secondary | ICD-10-CM | POA: Insufficient documentation

## 2022-01-12 MED ORDER — MORPHINE SULFATE 15 MG PO TABS: 15.0000 mg | ORAL_TABLET | ORAL | 0 refills | Status: DC | PRN

## 2022-01-12 MED ORDER — FENTANYL 75 MCG/HR TD PT72: 1.0000 | MEDICATED_PATCH | TRANSDERMAL | 0 refills | Status: DC

## 2022-01-12 NOTE — Addendum Note (Signed)
Addended by: Genice Rouge on: 01/12/2022 11:49 AM   Modules accepted: Orders
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
## 2022-01-12 NOTE — Progress Notes (Addendum)
Subjective:    Patient ID: Christopher [name], male    DOB: 1965-08-12, 56 y.o.   MRN: [id number]  HPI Pt is a 56 yr old male with hx of Multitrauma- causing L femoral neck fx, degloving of L hip to going/scrotum, bladder neck trauma- got SPC, developed compartment syndrome -s/p surgery for that; also diverting colostomy, skin grafts, and s/p trach and PEG- PEG is out. 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 S/P screw removal and on IV ABX for L hip osteomyelitis. 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  Has R severe ulnar neuropathy as well. Here for f/u on complicated medical issues as above.   Now completely off Seroquel and Trazodone On Zyprexa- increased to 10 mg QHS. Is also still on paxil.   Having episodes yesterday and today with anxiety.  At [facility] bday party.   Thinks regimen overall is a little better; when feeling better, anxiety will skyrocket.   Memory is awful- wondering if had head trauma.  Has gotten "brain scans" and were negative.   Not doing TF's flushing PEG- there if he gets sick and cannot tolerate food.   Had a fever a few days ago- been fine since- tylenol worked to break fever -was 101.5 or so.   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 Spelling? Takes daily.   Has hammer toes- but podiatry said there was nothing they can do.  Doing toe spacers   Pain 8/10- on average-  Things more stable than last couple of visits.  Psychiatry has been focused more on anxiety not pain meds.    Still having tremors- sounds worse with anxiety.  Only when lays down. When falling asleep.   Pain Inventory Average Pain 8 Pain Right Now 8 My pain is sharp, burning, dull, stabbing, tingling, and aching  LOCATION OF PAIN  wrist, hand, fingers, back, buttocks, groin, hip, thigh, knee, leg  BOWEL History of colostomy Yes    BLADDER Suprapubic frequency monthly/prn    Mobility use a walker how many minutes can you walk? 3 ability to climb steps?  no do you drive?  no use a wheelchair needs help with transfers  Function Do you have any goals in this area?  no  Neuro/Psych weakness numbness tremor tingling trouble walking spasms dizziness confusion depression anxiety  Prior Studies Any changes since last visit?  no  Physicians involved in your care Any changes since last visit?  no   Family History  Problem Relation Age of Onset   Breast cancer Mother        with mets to the bones   Social History   Socioeconomic History   Marital status: Married    Spouse name: Not on file   Number of children: Not on file   Years of education: Not on file   Highest education level: Not on file  Occupational History   Occupation: Disable  Tobacco Use   Smoking status: Former    Packs/day: 1.00  Years: 20.00    Total pack years: 20.00    Types: Cigarettes    Quit date: 07/10/2018    Years since quitting: 3.5   Smokeless tobacco: Never  Vaping Use   Vaping Use: Never used  Substance and Sexual Activity   Alcohol use: Never   Drug use: Yes    Types: Oxycodone, Fentanyl    Comment: Fentanyl patch/oxycodone since 06/2018   Sexual activity: Yes  Other Topics Concern   Not on file  Social History Narrative   ** Merged History Encounter **       Social Determinants of Health   Financial Resource Strain: Low Risk  (03/01/2019)   Overall Financial Resource Strain (CARDIA)    Difficulty of Paying Living Expenses: Not very hard  Food Insecurity: Food  Insecurity Present (03/01/2019)   Hunger Vital Sign    Worried About Running Out of Food in the Last Year: Sometimes true    Ran Out of Food in the Last Year: Sometimes true  Transportation Needs: No Transportation Needs (03/01/2019)   PRAPARE - Hydrologist (Medical): No    Lack of Transportation (Non-Medical): No  Physical Activity: Inactive (03/01/2019)   Exercise Vital Sign    Days of Exercise per Week: 0 days    Minutes of Exercise per Session: 0 min  Stress: Stress Concern Present (03/01/2019)   [place]    Feeling of Stress : Rather much  Social Connections: Not on file   Past Surgical History:  Procedure Laterality Date   APPLICATION OF A-CELL OF BACK N/A 08/06/2018   Procedure: Application Of A-Cell Of Back;  Surgeon: [name], DO;  Location: [place];  Service: Plastics;  Laterality: N/A;   APPLICATION OF A-CELL OF EXTREMITY Left 08/06/2018   Procedure: Application Of A-Cell Of Extremity;  Surgeon: [name], DO;  Location: [place];  Service: Plastics;  Laterality: Left;   APPLICATION OF A-CELL OF EXTREMITY Left 09/18/2019   Procedure: APPLICATION OF A-CELL OF EXTREMITY;  Surgeon: [name], DO;  Location: [place];  Service: Plastics;  Laterality: Left;   APPLICATION OF WOUND VAC  07/12/2018   Procedure: Application Of Wound Vac to the Left Thigh and Scrotum.;  Surgeon: Shona Needles, MD;  Location: [place];  Service: Orthopedics;;   APPLICATION OF WOUND VAC  07/10/2018   Procedure: Application Of Wound Vac;  Surgeon: Clovis Riley, MD;  Location: [place];  Service: General;;   COLON SURGERY  2020   colostomy   COLOSTOMY N/A 07/23/2018   Procedure: COLOSTOMY;  Surgeon: Georganna Skeans, MD;  Location: [place];  Service: General;  Laterality: N/A;   CYSTOSCOPY W/ URETERAL STENT PLACEMENT N/A 07/15/2018   Procedure: RETROGRADE URETHROGRAM;  Surgeon: Franchot Gallo, MD;  Location: [place];  Service: Urology;  Laterality: N/A;   CYSTOSCOPY WITH LITHOLAPAXY N/A 05/06/2019   Procedure: CYSTOSCOPY BASKET BLADDER STONE EXTRACTION;  Surgeon: Cleon Gustin, MD;  Location: [facility];  Service: Urology;  Laterality: N/A;  30 MINS   CYSTOSTOMY N/A 05/06/2019   Procedure: REPLACEMENT OF SUPRAPUBIC CATHETER;  Surgeon: Cleon Gustin, MD;  Location: [facility];  Service: Urology;  Laterality: N/A;   DEBRIDEMENT AND CLOSURE WOUND Left 03/04/2019   Procedure: Excision of hip wound with placement of Acell;  Surgeon: [name], DO;  Location: [place];  Service: Plastics;  Laterality: Left;   ESOPHAGOGASTRODUODENOSCOPY  N/A 08/14/2018   Procedure: ESOPHAGOGASTRODUODENOSCOPY (EGD);  Surgeon: Violeta Gelinas, MD;  Location[facility] ENDOSCOPY;  Service: General;  Laterality: N/A;  bedside   FACIAL RECONSTRUCTION SURGERY     X 2--once as a teenager and second time in his 70's   HARDWARE REMOVAL Left 03/04/2019   Procedure: Left Hip Hardware Removal;  Surgeon: Roby Lofts, MD;  [facility];  Service: Orthopedics;  Laterality: Left;   HIP PINNING,CANNULATED Left 07/12/2018   Procedure: CANNULATED HIP PINNING;  Surgeon: Roby Lofts, MD;  [facility];  Service: Orthopedics;  Laterality: Left;   HIP SURGERY     HOLMIUM LASER APPLICATION Right 07/15/2019   Procedure: HOLMIUM LASER APPLICATION;  Surgeon: Malen Gauze, MD;  Location: [facility];  Service: Urology;  Laterality: Right;   I & D EXTREMITY Left 07/25/2018   Procedure: Debridement of buttock, scrotum and left leg, placement of acell and vac;  Surgeon: Peggye Form, DO;  [facility];  Service: Plastics;  Laterality: Left;   I & D EXTREMITY N/A 08/06/2018   Procedure: Debridement of buttock, scrotum and left leg;  Surgeon: Peggye Form, DO;  [facility];  Service: Plastics;  Laterality: N/A;   I & D EXTREMITY N/A  08/13/2018   Procedure: Debridement of buttock, scrotum and left leg, placement of acell and vac;  Surgeon: Peggye Form, DO;  [facility];  Service: Plastics;  Laterality: N/A;  90 min, please   INCISION AND DRAINAGE HIP Left 09/18/2019   Procedure: IRRIGATION AND DEBRIDEMENT HIP/ PELVIS WITH WOUND VAC PLACEMENT;  Surgeon: Roby Lofts, MD;  [facility];  Service: Orthopedics;  Laterality: Left;   INCISION AND DRAINAGE OF WOUND N/A 07/18/2018   Procedure: Debridement of left leg, buttocks and scrotal wound with placement of acell and Flexiseal;  Surgeon: Peggye Form, DO;  [facility];  Service: Plastics;  Laterality: N/A;   INCISION AND DRAINAGE OF WOUND Left 08/29/2018   Procedure: Debridement of buttock, scrotum and left leg, placement of acell and vac;  Surgeon: Peggye Form, DO;  [facility];  Service: Plastics;  Laterality: Left;  75 min, please   INCISION AND DRAINAGE OF WOUND Bilateral 10/23/2018   Procedure: DEBRIDEMENT OF BUTTOCK,SCROTUM, AND LEG WOUNDS WITH PLACEMENT OF ACELL- BILATERAL 90 MIN;  Surgeon: Peggye Form, DO;  [facility];  Service: Plastics;  Laterality: Bilateral;   IR ANGIOGRAM PELVIS SELECTIVE OR SUPRASELECTIVE  07/10/2018   IR ANGIOGRAM PELVIS SELECTIVE OR SUPRASELECTIVE  07/10/2018   IR ANGIOGRAM SELECTIVE EACH ADDITIONAL VESSEL  07/10/2018   IR EMBO ART  VEN HEMORR LYMPH EXTRAV  INC GUIDE ROADMAPPING  07/10/2018   IR GASTROSTOMY TUBE MOD SED  01/21/2021   IR NEPHROSTOMY PLACEMENT LEFT  04/05/2019   IR NEPHROSTOMY PLACEMENT RIGHT  05/31/2019   IR US GUIDE BX ASP/DRAIN  07/10/2018   IR US GUIDE VASC ACCESS RIGHT  07/10/2018   IR VENO/EXT/UNI LEFT  07/10/2018   IRRIGATION AND DEBRIDEMENT OF WOUND WITH SPLIT THICKNESS SKIN GRAFT Left 09/19/2018   Procedure: Debridement of gluteal wound with placement of acell to left leg.;  Surgeon: Peggye Form, DO;  [facility];  Service: Plastics;  Laterality: Left;  2.5 hours,  please   LAPAROTOMY N/A 07/12/2018   Procedure: EXPLORATORY LAPAROTOMY;  Surgeon: Violeta Gelinas, MD;  Location: [facility];  Service: General;  Laterality: N/A;   LAPAROTOMY N/A 07/15/2018   Procedure: WOUND EXPLORATION; CLOSURE  OF ABDOMEN;  Surgeon: Georganna Skeans, MD;  Location: [place];  Service: General;  Laterality: N/A;   LAPAROTOMY  07/10/2018   Procedure: Exploratory Laparotomy;  Surgeon: Clovis Riley, MD;  Location: [place];  Service: General;;   MASS EXCISION Left 09/18/2019   Procedure: EXCISION UPPER LEFT INNER THIGH WOUND;  Surgeon: [name], DO;  Location: [place];  Service: Plastics;  Laterality: Left;   NEPHROLITHOTOMY Right 07/15/2019   Procedure: NEPHROLITHOTOMY PERCUTANEOUS;  Surgeon: Cleon Gustin, MD;  Location[facility];  Service: Urology;  Laterality: Right;  90 MINS   PEG PLACEMENT N/A 08/14/2018   Procedure: PERCUTANEOUS ENDOSCOPIC GASTROSTOMY (PEG) PLACEMENT;  Surgeon: Georganna Skeans, MD;  Location: [place];  Service: General;  Laterality: N/A;   PERCUTANEOUS TRACHEOSTOMY N/A 08/02/2018   Procedure: PERCUTANEOUS TRACHEOSTOMY;  Surgeon: Georganna Skeans, MD;  Location: [place];  Service: General;  Laterality: N/A;   RADIOLOGY WITH ANESTHESIA N/A 07/10/2018   Procedure: IR WITH ANESTHESIA;  Surgeon: Sandi Mariscal, MD;  Location: [place];  Service: Radiology;  Laterality: N/A;   RADIOLOGY WITH ANESTHESIA Right 07/10/2018   Procedure: Ir With Anesthesia;  Surgeon: Sandi Mariscal, MD;  Location: [place];  Service: Radiology;  Laterality: Right;   SCROTAL EXPLORATION N/A 07/15/2018   Procedure: SCROTUM DEBRIDEMENT;  Surgeon: Franchot Gallo, MD;  Location: [place];  Service: Urology;  Laterality: N/A;   SHOULDER SURGERY     SKIN SPLIT GRAFT Right 09/19/2018   Procedure: Skin Graft Split Thickness;  Surgeon: [name], DO;  Location: [place];  Service: Plastics;  Laterality: Right;   SKIN SPLIT GRAFT N/A 10/03/2018   Procedure: Split thickness skin graft  to gluteal area with acell placement;  Surgeon: [name], DO;  Location: [place];  Service: Plastics;  Laterality: N/A;  3 hours, please   VACUUM ASSISTED CLOSURE CHANGE N/A 07/12/2018   Procedure: ABDOMINAL VACUUM ASSISTED CLOSURE CHANGE and abdominal washout;  Surgeon: Georganna Skeans, MD;  Location: [place];  Service: General;  Laterality: N/A;   WOUND DEBRIDEMENT Left 07/23/2018   Procedure: DEBRIDEMENT LEFT BUTTOCK  WOUND;  Surgeon: Georganna Skeans, MD;  Location: [place];  Service: General;  Laterality: Left;   WOUND EXPLORATION Left 07/10/2018   Procedure: WOUND EXPLORATION LEFT GROIN;  Surgeon: Clovis Riley, MD;  Location: [place] [facility];  Service: General;  Laterality: Left;   Past Medical History:  Diagnosis Date   Acute on chronic respiratory failure with hypoxia ([place]) 06/2018   trach removed 11-16-2018, on vent from [place] until may 2020 - uses albuterol prn   Anxiety    Bacteremia due to Pseudomonas 06/2018   Chronic osteomyelitis ([place])    Chronic pain syndrome    Clostridium difficile colitis 10/30/2019   tx with abx    Depression    DVT (deep venous thrombosis) ([place]) 2020   right brachial post PICC line   History of blood transfusion 06/2018   History of Clostridioides difficile colitis    History of kidney stones    Hypertension    norvasc d/c by pcp on 11/05/19   Multiple traumatic injuries    Penile pain 11/18/2019   Pneumonia 11/2009   2020 x 2   Walker as ambulation aid    Wheelchair bound    electric   Wound discharge    left hip wound with bloody/clear drainage change dressing q day surgilube with gauze, between legs wound using calcium algenate pad bid   BP 129/86  Pulse 96   Ht 6\' 3"  (1.905 m)   SpO2 97%   BMI 20.62 kg/m   Opioid Risk Score:   Fall Risk Score:  `1  Depression screen [facility] 2/9     01/12/2022   11:17 AM 11/17/2021   11:30 AM 10/06/2021   11:28 AM 08/13/2021   11:33 AM 07/05/2021    2:39 PM 05/28/2021    3:00 PM 05/10/2021   10:30 AM   Depression screen PHQ 2/9  Decreased Interest 0 1 1 1  0 1 1  Down, Depressed, Hopeless 1 1 1 1  0 1 1  PHQ - 2 Score 1 2 2 2  0 2 2     Review of Systems  Constitutional: Negative.   HENT: Negative.    Eyes: Negative.   Respiratory: Negative.    Cardiovascular: Negative.   Gastrointestinal: Negative.   Endocrine: Negative.   Genitourinary: Negative.   Musculoskeletal:  Positive for back pain and gait problem.  Skin: Negative.   Allergic/Immunologic: Negative.   Neurological:  Positive for tremors, weakness and numbness.  Hematological: Negative.   Psychiatric/Behavioral:  Positive for confusion and dysphoric mood. The patient is nervous/anxious.       Objective:   Physical Exam  Awake, alert, poor memory; eyes slightly glossy; not bulging like wife was concerned about; on 3L O2, by [place]; chronic; in power w/c; brighter affect today, NAD Not crying today      Assessment & Plan:   Pt is a 56 yr old male with hx of Multitrauma- causing L femoral neck fx, degloving of L hip to going/scrotum, bladder neck trauma- got SPC, developed compartment syndrome -s/p surgery for that; also diverting colostomy, skin grafts, and s/p trach and PEG- PEG is out. 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 S/P screw removal and on IV ABX for L hip osteomyelitis. 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  Has R severe ulnar neuropathy as well.  Anxiety- next appt 02/07/22- with psychiatry- also if they do biofeedback, might be helpful?  2. Give my cell phone to  Dr - psychiatry and see what he thinks about changing pain meds/reducing at all.  Since I'm wondering if pt is having opiate hypersensitivity.  Also ask if he does biofeedback, or if pt can receive?   3. Went over biofeedback and what it is- and how it works.    4. Seeing palliative care- continue  5. Continue all meds prescribed. Won't change pain meds right now.    6. Jerking/tremors- are Hypnogogic jerks- fyi.   7. F/U in 6 weeks- double appt.   8. Memory issues could be due to lack of O2 that had until got Oxygen for COPD.   9. Refilled Duragesic and MSIR at same dose for now.  I spent a total of 31   minutes on total care today- >50% coordination of care- due to discussion about memory issues; chronic pain- and d/w wife about psychiatry/biofeedback- also d/w worker's comp nurse about plan.

## 2022-01-18 ENCOUNTER — Telehealth: Payer: Self-pay | Admitting: Student

## 2022-01-22 ENCOUNTER — Other Ambulatory Visit: Payer: Self-pay | Admitting: Physical Medicine and Rehabilitation

## 2022-02-11 ENCOUNTER — Telehealth: Payer: Self-pay | Admitting: Family Medicine

## 2022-02-11 NOTE — Telephone Encounter (Signed)
..  Home Health Certification or Plan of Care Tracking  Is this a Certification or Plan of Care? both  [facility] Agency: [facility]  Order Number:   [id number]  Has charge sheet been attached? Yes  Where has form been placed:   Providers box  Faxed to:

## 2022-02-16 ENCOUNTER — Telehealth: Payer: Self-pay

## 2022-02-16 DIAGNOSIS — G8220 Paraplegia, unspecified: Secondary | ICD-10-CM | POA: Diagnosis not present

## 2022-02-16 DIAGNOSIS — G4700 Insomnia, unspecified: Secondary | ICD-10-CM

## 2022-02-16 DIAGNOSIS — F32A Depression, unspecified: Secondary | ICD-10-CM

## 2022-02-16 DIAGNOSIS — Z9981 Dependence on supplemental oxygen: Secondary | ICD-10-CM

## 2022-02-16 DIAGNOSIS — K5903 Drug induced constipation: Secondary | ICD-10-CM

## 2022-02-16 DIAGNOSIS — E43 Unspecified severe protein-calorie malnutrition: Secondary | ICD-10-CM

## 2022-02-16 DIAGNOSIS — L89152 Pressure ulcer of sacral region, stage 2: Secondary | ICD-10-CM | POA: Diagnosis not present

## 2022-02-16 DIAGNOSIS — F1120 Opioid dependence, uncomplicated: Secondary | ICD-10-CM

## 2022-02-16 DIAGNOSIS — D649 Anemia, unspecified: Secondary | ICD-10-CM

## 2022-02-16 DIAGNOSIS — Z435 Encounter for attention to cystostomy: Secondary | ICD-10-CM

## 2022-02-16 DIAGNOSIS — M86652 Other chronic osteomyelitis, left thigh: Secondary | ICD-10-CM | POA: Diagnosis not present

## 2022-02-16 DIAGNOSIS — G894 Chronic pain syndrome: Secondary | ICD-10-CM

## 2022-02-16 DIAGNOSIS — F419 Anxiety disorder, unspecified: Secondary | ICD-10-CM

## 2022-02-16 DIAGNOSIS — J9611 Chronic respiratory failure with hypoxia: Secondary | ICD-10-CM

## 2022-02-16 DIAGNOSIS — R1310 Dysphagia, unspecified: Secondary | ICD-10-CM

## 2022-02-16 DIAGNOSIS — R339 Retention of urine, unspecified: Secondary | ICD-10-CM

## 2022-02-16 DIAGNOSIS — E876 Hypokalemia: Secondary | ICD-10-CM

## 2022-02-16 DIAGNOSIS — K219 Gastro-esophageal reflux disease without esophagitis: Secondary | ICD-10-CM

## 2022-02-16 DIAGNOSIS — I10 Essential (primary) hypertension: Secondary | ICD-10-CM | POA: Diagnosis not present

## 2022-02-16 DIAGNOSIS — Z993 Dependence on wheelchair: Secondary | ICD-10-CM

## 2022-02-16 DIAGNOSIS — Z79899 Other long term (current) drug therapy: Secondary | ICD-10-CM

## 2022-02-16 DIAGNOSIS — T50905D Adverse effect of unspecified drugs, medicaments and biological substances, subsequent encounter: Secondary | ICD-10-CM

## 2022-02-16 DIAGNOSIS — Z87891 Personal history of nicotine dependence: Secondary | ICD-10-CM

## 2022-02-16 DIAGNOSIS — Z466 Encounter for fitting and adjustment of urinary device: Secondary | ICD-10-CM

## 2022-02-16 DIAGNOSIS — Z87440 Personal history of urinary (tract) infections: Secondary | ICD-10-CM

## 2022-02-16 DIAGNOSIS — Z933 Colostomy status: Secondary | ICD-10-CM

## 2022-02-16 DIAGNOSIS — I700 Atherosclerosis of aorta: Secondary | ICD-10-CM

## 2022-02-16 DIAGNOSIS — Z79891 Long term (current) use of opiate analgesic: Secondary | ICD-10-CM

## 2022-02-16 MED ORDER — MORPHINE SULFATE 15 MG PO TABS: 15.0000 mg | ORAL_TABLET | ORAL | 0 refills | Status: DC | PRN

## 2022-02-16 MED ORDER — FENTANYL 75 MCG/HR TD PT72: 1.0000 | MEDICATED_PATCH | TRANSDERMAL | 0 refills | Status: DC

## 2022-02-16 NOTE — Telephone Encounter (Signed)
PMP Report:   Filled  Written  ID  Drug  QTY  Days  Prescriber  RX #  Dispenser  Refill  Daily Dose*  Pymt Type  PMP  01/12/2022 01/12/2022 1  Fentanyl 75 Mcg/hr Patch 15.00 30 Me Lov [id number] [place] ([id number]) 0/0 270.00 MME Worker's Comp [place] 01/12/2022 01/12/2022 1  Morphine Sulfate Ir 15 Mg Tab 240.00 30 Me Lov [id number] Nor ([id number]) 0/0 120.00 MME Worker's Comp [place]  Wife called for refills of Fentanyl Patch & Morphine 15 MG

## 2022-02-16 NOTE — Telephone Encounter (Signed)
Placed on PCP office to be sign

## 2022-02-18 NOTE — Telephone Encounter (Signed)
Given to [employer] office to fax

## 2022-02-28 ENCOUNTER — Ambulatory Visit (INDEPENDENT_AMBULATORY_CARE_PROVIDER_SITE_OTHER): Payer: PRIVATE HEALTH INSURANCE | Admitting: Internal Medicine

## 2022-02-28 ENCOUNTER — Other Ambulatory Visit: Payer: Self-pay | Admitting: Family Medicine

## 2022-02-28 ENCOUNTER — Encounter: Payer: Self-pay | Admitting: Internal Medicine

## 2022-02-28 VITALS — BP 118/68 | HR 70 | Temp 97.8°F

## 2022-02-28 DIAGNOSIS — Z23 Encounter for immunization: Secondary | ICD-10-CM | POA: Diagnosis not present

## 2022-02-28 DIAGNOSIS — J9611 Chronic respiratory failure with hypoxia: Secondary | ICD-10-CM

## 2022-02-28 DIAGNOSIS — J449 Chronic obstructive pulmonary disease, unspecified: Secondary | ICD-10-CM

## 2022-02-28 NOTE — Patient Instructions (Addendum)
Please schedule follow up scheduled with myself in 6 months.  If my schedule is not open yet, we will contact you with a reminder closer to that time. Please call [phone] if you haven't heard from [place] a month before.   Continue the anoro inhaler 1 puff once a day.  Continue albuterol inhaler as needed.   Call us sooner if any issues or concerns about your breathing.

## 2022-02-28 NOTE — Progress Notes (Signed)
[name]    [id number]    10/12/1965  Primary Care Physician:Parker, Katina Degree, MD Date of Appointment: 02/28/2022 Established Patient Visit  Chief complaint:   Chief Complaint  Patient presents with   Follow-up    SOB unchanged      HPI: Christopher [name] is a 56 y.o. 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 Has chronic aspiration and is on nebulized saline and flutter valve for this. He has also had a PEG tube placed and undergone SLP. On chronic 3LNC.  Interval Updates: Here for follow up. At last visit we had started anoro for COPD. No interval exacerbations of COPD. He does perceive benefit.  Still on 3LNC. Using albuterol about once a day on most days - some days up to 3 times/day.   Overall feels like he is doing well. Was able to take his car - a vintage chevrolet - out for car show recently.  He appears more alert and calm and comfortable than I have seen him previously.   DME company [facility].   I have reviewed the patient's family social and past medical history and updated as appropriate.   Past Medical History:  Diagnosis Date   Acute on chronic respiratory failure with hypoxia (HCC) 06/2018   trach removed 11-16-2018, on vent from jan until may 2020 - uses albuterol prn   Anxiety    Bacteremia due to Pseudomonas 06/2018   Chronic osteomyelitis (HCC)    Chronic pain syndrome    Clostridium difficile colitis 10/30/2019   tx with abx    Depression    DVT (deep venous thrombosis) (HCC) 2020   right brachial post PICC line   History of blood transfusion 06/2018   History of Clostridioides difficile colitis    History of kidney stones    Hypertension    norvasc d/c by pcp on 11/05/19   Multiple traumatic injuries    Penile pain 11/18/2019   Pneumonia 11/2009   2020 x 2   Walker as ambulation aid    Wheelchair bound     electric   Wound discharge    left hip wound with bloody/clear drainage change dressing q day surgilube with gauze, between legs wound using calcium algenate pad bid    Past Surgical History:  Procedure Laterality Date   APPLICATION OF A-CELL OF BACK N/A 08/06/2018   Procedure: Application Of A-Cell Of Back;  Surgeon: Peggye Form, DO;  [facility];  Service: Plastics;  Laterality: N/A;   APPLICATION OF A-CELL OF EXTREMITY Left 08/06/2018   Procedure: Application Of A-Cell Of Extremity;  Surgeon: Peggye Form, DO;  [facility];  Service: Plastics;  Laterality: Left;   APPLICATION OF A-CELL OF EXTREMITY Left 09/18/2019   Procedure: APPLICATION OF A-CELL OF EXTREMITY;  Surgeon: Peggye Form, DO;  [facility];  Service: Plastics;  Laterality: Left;   APPLICATION OF WOUND VAC  07/12/2018   Procedure: Application Of Wound Vac to the Left Thigh and Scrotum.;  Surgeon: Roby Lofts, MD;  [facility];  Service: Orthopedics;;   APPLICATION OF WOUND VAC  07/10/2018   Procedure: Application Of Wound Vac;  Surgeon: Berna Bue, MD;  [facility];  Service: General;;   COLON SURGERY  2020   colostomy   COLOSTOMY N/A 07/23/2018   Procedure: COLOSTOMY;  Surgeon: Georganna Skeans, MD;  Location: [place];  Service: General;  Laterality: N/A;   CYSTOSCOPY W/ URETERAL STENT PLACEMENT N/A 07/15/2018   Procedure: RETROGRADE URETHROGRAM;  Surgeon: Franchot Gallo, MD;  Location: [place];  Service: Urology;  Laterality: N/A;   CYSTOSCOPY WITH LITHOLAPAXY N/A 05/06/2019   Procedure: CYSTOSCOPY BASKET BLADDER STONE EXTRACTION;  Surgeon: Cleon Gustin, MD;  Location[facility];  Service: Urology;  Laterality: N/A;  30 MINS   CYSTOSTOMY N/A 05/06/2019   Procedure: REPLACEMENT OF SUPRAPUBIC CATHETER;  Surgeon: Cleon Gustin, MD;  Location: [facility];  Service: Urology;  Laterality: N/A;   DEBRIDEMENT AND CLOSURE WOUND Left  03/04/2019   Procedure: Excision of hip wound with placement of Acell;  Surgeon: [name], DO;  Location: [place];  Service: Plastics;  Laterality: Left;   ESOPHAGOGASTRODUODENOSCOPY N/A 08/14/2018   Procedure: ESOPHAGOGASTRODUODENOSCOPY (EGD);  Surgeon: Georganna Skeans, MD;  Location: [place];  Service: General;  Laterality: N/A;  bedside   FACIAL RECONSTRUCTION SURGERY     X 2--once as a teenager and second time in his 47's   HARDWARE REMOVAL Left 03/04/2019   Procedure: Left Hip Hardware Removal;  Surgeon: Shona Needles, MD;  Location: [place];  Service: Orthopedics;  Laterality: Left;   HIP PINNING,CANNULATED Left 07/12/2018   Procedure: CANNULATED HIP PINNING;  Surgeon: Shona Needles, MD;  Location: [place];  Service: Orthopedics;  Laterality: Left;   HIP SURGERY     HOLMIUM LASER APPLICATION Right 10/14/2704   Procedure: HOLMIUM LASER APPLICATION;  Surgeon: Cleon Gustin, MD;  Location: [facility];  Service: Urology;  Laterality: Right;   I & D EXTREMITY Left 07/25/2018   Procedure: Debridement of buttock, scrotum and left leg, placement of acell and vac;  Surgeon: [name], DO;  Location: [place];  Service: Plastics;  Laterality: Left;   I & D EXTREMITY N/A 08/06/2018   Procedure: Debridement of buttock, scrotum and left leg;  Surgeon: [name], DO;  Location: [place];  Service: Plastics;  Laterality: N/A;   I & D EXTREMITY N/A 08/13/2018   Procedure: Debridement of buttock, scrotum and left leg, placement of acell and vac;  Surgeon: [name], DO;  Location: [place];  Service: Plastics;  Laterality: N/A;  90 min, please   INCISION AND DRAINAGE HIP Left 09/18/2019   Procedure: IRRIGATION AND DEBRIDEMENT HIP/ PELVIS WITH WOUND VAC PLACEMENT;  Surgeon: Shona Needles, MD;  Location: [place];  Service: Orthopedics;  Laterality: Left;   INCISION AND DRAINAGE OF WOUND N/A 07/18/2018   Procedure: Debridement of left leg, buttocks and scrotal  wound with placement of acell and Flexiseal;  Surgeon: [name], DO;  Location: [place];  Service: Plastics;  Laterality: N/A;   INCISION AND DRAINAGE OF WOUND Left 08/29/2018   Procedure: Debridement of buttock, scrotum and left leg, placement of acell and vac;  Surgeon: [name], DO;  Location: [place];  Service: Plastics;  Laterality: Left;  75 min, please   INCISION AND DRAINAGE OF WOUND Bilateral 10/23/2018   Procedure: DEBRIDEMENT OF BUTTOCK,SCROTUM, AND LEG WOUNDS WITH PLACEMENT OF ACELL- BILATERAL 90 MIN;  Surgeon: [name], DO;  Location: [place];  Service: Plastics;  Laterality: Bilateral;   IR ANGIOGRAM PELVIS SELECTIVE OR SUPRASELECTIVE  07/10/2018   IR ANGIOGRAM PELVIS SELECTIVE OR SUPRASELECTIVE  07/10/2018   IR ANGIOGRAM SELECTIVE EACH ADDITIONAL VESSEL  07/10/2018   IR EMBO  ART  VEN HEMORR LYMPH EXTRAV  INC GUIDE ROADMAPPING  07/10/2018   IR GASTROSTOMY TUBE MOD SED  01/21/2021   IR NEPHROSTOMY PLACEMENT LEFT  04/05/2019   IR NEPHROSTOMY PLACEMENT RIGHT  05/31/2019   IR US GUIDE BX ASP/DRAIN  07/10/2018   IR US GUIDE VASC ACCESS RIGHT  07/10/2018   IR VENO/EXT/UNI LEFT  07/10/2018   IRRIGATION AND DEBRIDEMENT OF WOUND WITH SPLIT THICKNESS SKIN GRAFT Left 09/19/2018   Procedure: Debridement of gluteal wound with placement of acell to left leg.;  Surgeon: Peggye Formillingham, Claire S, DO;  [facility];  Service: Plastics;  Laterality: Left;  2.5 hours, please   LAPAROTOMY N/A 07/12/2018   Procedure: EXPLORATORY LAPAROTOMY;  Surgeon: Violeta Gelinashompson, Burke, MD;  [facility];  Service: General;  Laterality: N/A;   LAPAROTOMY N/A 07/15/2018   Procedure: WOUND EXPLORATION; CLOSURE OF ABDOMEN;  Surgeon: Violeta Gelinashompson, Burke, MD;  [facility];  Service: General;  Laterality: N/A;   LAPAROTOMY  07/10/2018   Procedure: Exploratory Laparotomy;  Surgeon: Berna Bueonnor, Chelsea A, MD;  [facility];  Service: General;;   MASS EXCISION Left 09/18/2019   Procedure: EXCISION UPPER LEFT INNER  THIGH WOUND;  Surgeon: Peggye Formillingham, Claire S, DO;  [facility];  Service: Plastics;  Laterality: Left;   NEPHROLITHOTOMY Right 07/15/2019   Procedure: NEPHROLITHOTOMY PERCUTANEOUS;  Surgeon: Malen GauzeMcKenzie, Patrick L, MD;  [facility];  Service: Urology;  Laterality: Right;  90 MINS   PEG PLACEMENT N/A 08/14/2018   Procedure: PERCUTANEOUS ENDOSCOPIC GASTROSTOMY (PEG) PLACEMENT;  Surgeon: Violeta Gelinashompson, Burke, MD;  [facility];  Service: General;  Laterality: N/A;   PERCUTANEOUS TRACHEOSTOMY N/A 08/02/2018   Procedure: PERCUTANEOUS TRACHEOSTOMY;  Surgeon: Violeta Gelinashompson, Burke, MD;  [facility];  Service: General;  Laterality: N/A;   RADIOLOGY WITH ANESTHESIA N/A 07/10/2018   Procedure: IR WITH ANESTHESIA;  Surgeon: Simonne ComeWatts, John, MD;  [facility];  Service: Radiology;  Laterality: N/A;   RADIOLOGY WITH ANESTHESIA Right 07/10/2018   Procedure: Ir With Anesthesia;  Surgeon: Simonne ComeWatts, John, MD;  [facility];  Service: Radiology;  Laterality: Right;   SCROTAL EXPLORATION N/A 07/15/2018   Procedure: SCROTUM DEBRIDEMENT;  Surgeon: Marcine Matarahlstedt, Stephen, MD;  [facility];  Service: Urology;  Laterality: N/A;   SHOULDER SURGERY     SKIN SPLIT GRAFT Right 09/19/2018   Procedure: Skin Graft Split Thickness;  Surgeon: Peggye Formillingham, Claire S, DO;  [facility];  Service: Plastics;  Laterality: Right;   SKIN SPLIT GRAFT N/A 10/03/2018   Procedure: Split thickness skin graft to gluteal area with acell placement;  Surgeon: Peggye Formillingham, Claire S, DO;  [facility];  Service: Plastics;  Laterality: N/A;  3 hours, please   VACUUM ASSISTED CLOSURE CHANGE N/A 07/12/2018   Procedure: ABDOMINAL VACUUM ASSISTED CLOSURE CHANGE and abdominal washout;  Surgeon: Violeta Gelinashompson, Burke, MD;  [facility];  Service: General;  Laterality: N/A;   WOUND DEBRIDEMENT Left 07/23/2018   Procedure: DEBRIDEMENT LEFT BUTTOCK  WOUND;  Surgeon: Violeta Gelinashompson, Burke, MD;  [facility];  Service: General;  Laterality: Left;    WOUND EXPLORATION Left 07/10/2018   Procedure: WOUND EXPLORATION LEFT GROIN;  Surgeon: Berna Bueonnor, Chelsea A, MD;  [facility];  Service: General;  Laterality: Left;    Family History  Problem Relation Age of Onset   Breast cancer Mother        with mets to the bones    Social History   Occupational History   Occupation: Disable  Tobacco Use  Smoking status: Former    Packs/day: 1.00    Years: 20.00    Total pack years: 20.00    Types: Cigarettes    Quit date: 07/10/2018    Years since quitting: 3.6   Smokeless tobacco: Never  Vaping Use   Vaping Use: Never used  Substance and Sexual Activity   Alcohol use: Never   Drug use: Yes    Types: Oxycodone, Fentanyl    Comment: Fentanyl patch/oxycodone since 06/2018   Sexual activity: Yes     Physical Exam: Blood pressure 118/68, pulse 70, temperature 97.8 F (36.6 C), temperature source Oral, SpO2 99 %.  Gen:      NAD, in wheel chair, alert, oriented, well appearing ENT:  on nasal cannula Lungs:   ctab no wheezes or crackles CV:        RRR no mrg  Data Reviewed: Imaging: I have personally reviewed the CT A/P June 2022 with Chronic Osteoemylitis of the sacrum. Lower chest with atelectasis and trace left sided effusion.  PFTs:     Latest Ref Rng & Units 12/16/2021   12:23 PM  PFT Results  FVC-Pre L 2.81   FVC-Predicted Pre % 51   Pre FEV1/FVC % % 64   FEV1-Pre L 1.80   FEV1-Predicted Pre % 43   DLCO uncorrected ml/min/mmHg 12.55   DLCO UNC% % 40   DLCO corrected ml/min/mmHg 12.55   DLCO COR %Predicted % 40   DLVA Predicted % 73      Labs: Lab Results  Component Value Date   WBC 10.5 08/05/2021   HGB 14.0 08/05/2021   HCT 43.5 08/05/2021   MCV 95.2 08/05/2021   PLT 270 08/05/2021   Lab Results  Component Value Date   NA 137 08/05/2021   K 3.9 08/05/2021   CL 100 08/05/2021   CO2 26 08/05/2021    Immunization status: Immunization History  Administered Date(s) Administered   Influenza,inj,Quad  PF,6+ Mos 03/12/2019, 03/12/2020, 05/25/2021   PFIZER(Purple Top)SARS-COV-2 Vaccination 09/28/2019, 10/22/2019   Pneumococcal Conjugate-13 10/27/2020   Tdap 10/31/2011, 02/22/2017    External Records Personally Reviewed: PCP, PMR  Assessment:  COPD, Severe FEV1 43% of predicted Chronic respiratory failure on 3LNC Neuromuscular Weakness, wheelchair bound following severe occupational injury in 2020.   Plan/Recommendations: Continue the anoro inhaler 1 puff once a day.  Continue albuterol inhaler as needed.   Call us sooner if any issues or concerns about your breathing.   Flu shot today.   Return to Care: Return in about 6 months (around 08/29/2022).   Durel Salts, MD Pulmonary and Critical Care Medicine [facility] [phone]

## 2022-03-06 ENCOUNTER — Emergency Department (HOSPITAL_COMMUNITY): Payer: No Typology Code available for payment source

## 2022-03-06 ENCOUNTER — Emergency Department (HOSPITAL_COMMUNITY)
Admission: EM | Admit: 2022-03-06 | Discharge: 2022-03-06 | Disposition: A | Discharge status: Home / Self Care | Payer: No Typology Code available for payment source | Attending: Emergency Medicine | Admitting: Emergency Medicine

## 2022-03-06 ENCOUNTER — Other Ambulatory Visit: Payer: Self-pay

## 2022-03-06 ENCOUNTER — Encounter (HOSPITAL_COMMUNITY): Payer: Self-pay

## 2022-03-06 DIAGNOSIS — N390 Urinary tract infection, site not specified: Secondary | ICD-10-CM | POA: Insufficient documentation

## 2022-03-06 DIAGNOSIS — J449 Chronic obstructive pulmonary disease, unspecified: Secondary | ICD-10-CM | POA: Insufficient documentation

## 2022-03-06 DIAGNOSIS — I10 Essential (primary) hypertension: Secondary | ICD-10-CM | POA: Diagnosis not present

## 2022-03-06 DIAGNOSIS — M25551 Pain in right hip: Secondary | ICD-10-CM | POA: Diagnosis present

## 2022-03-06 DIAGNOSIS — M25552 Pain in left hip: Secondary | ICD-10-CM

## 2022-03-06 LAB — CBC WITH DIFFERENTIAL/PLATELET
Abs Immature Granulocytes: 0.01 10*3/uL (ref 0.00–0.07)
Basophils Absolute: 0 10*3/uL (ref 0.0–0.1)
Basophils Relative: 1 %
Eosinophils Absolute: 0.4 10*3/uL (ref 0.0–0.5)
Eosinophils Relative: 6 %
HCT: 41.1 % (ref 39.0–52.0)
Hemoglobin: 13.2 g/dL (ref 13.0–17.0)
Immature Granulocytes: 0 %
Lymphocytes Relative: 17 %
Lymphs Abs: 1 10*3/uL (ref 0.7–4.0)
MCH: 31.9 pg (ref 26.0–34.0)
MCHC: 32.1 g/dL (ref 30.0–36.0)
MCV: 99.3 fL (ref 80.0–100.0)
Monocytes Absolute: 0.3 10*3/uL (ref 0.1–1.0)
Monocytes Relative: 6 %
Neutro Abs: 4.1 10*3/uL (ref 1.7–7.7)
Neutrophils Relative %: 70 %
Platelets: 234 10*3/uL (ref 150–400)
RBC: 4.14 MIL/uL — ABNORMAL LOW (ref 4.22–5.81)
RDW: 13 % (ref 11.5–15.5)
WBC: 5.8 10*3/uL (ref 4.0–10.5)
nRBC: 0 % (ref 0.0–0.2)

## 2022-03-06 LAB — URINALYSIS, ROUTINE W REFLEX MICROSCOPIC
Bilirubin Urine: NEGATIVE
Glucose, UA: NEGATIVE mg/dL
Ketones, ur: NEGATIVE mg/dL
Nitrite: NEGATIVE
Protein, ur: 100 mg/dL — AB
Specific Gravity, Urine: 1.017 (ref 1.005–1.030)
WBC, UA: >50 WBC/hpf — ABNORMAL HIGH (ref 0–5)
pH: 6 (ref 5.0–8.0)

## 2022-03-06 LAB — COMPREHENSIVE METABOLIC PANEL
ALT: 59 U/L — ABNORMAL HIGH (ref 0–44)
AST: 49 U/L — ABNORMAL HIGH (ref 15–41)
Albumin: 4 g/dL (ref 3.5–5.0)
Alkaline Phosphatase: 202 U/L — ABNORMAL HIGH (ref 38–126)
Anion gap: 8 (ref 5–15)
BUN: 15 mg/dL (ref 6–20)
CO2: 25 mmol/L (ref 22–32)
Calcium: 9.8 mg/dL (ref 8.9–10.3)
Chloride: 105 mmol/L (ref 98–111)
Creatinine, Ser: 0.6 mg/dL — ABNORMAL LOW (ref 0.61–1.24)
GFR, Estimated: >60 mL/min (ref >60)
Glucose, Bld: 140 mg/dL — ABNORMAL HIGH (ref 70–99)
Potassium: 3.7 mmol/L (ref 3.5–5.1)
Sodium: 138 mmol/L (ref 135–145)
Total Bilirubin: 0.5 mg/dL (ref 0.3–1.2)
Total Protein: 8.2 g/dL — ABNORMAL HIGH (ref 6.5–8.1)

## 2022-03-06 LAB — LACTIC ACID, PLASMA: Lactic Acid, Venous: 1.9 mmol/L (ref 0.5–1.9)

## 2022-03-06 MED ORDER — IOHEXOL 300 MG/ML  SOLN
100.0000 mL | Freq: Once | INTRAMUSCULAR | Status: AC | PRN
  Administered 2022-03-06: 100 mL via INTRAVENOUS

## 2022-03-06 MED ORDER — HYDROMORPHONE HCL 1 MG/ML IJ SOLN
1.0000 mg | Freq: Once | INTRAMUSCULAR | Status: AC
  Administered 2022-03-06: 1 mg via INTRAVENOUS
  Filled 2022-03-06: qty 1

## 2022-03-06 MED ORDER — CEPHALEXIN 500 MG PO CAPS: 500.0000 mg | ORAL_CAPSULE | Freq: Two times a day (BID) | ORAL | 0 refills | Status: AC

## 2022-03-06 MED ORDER — SODIUM CHLORIDE 0.9 % IV SOLN
1.0000 g | Freq: Once | INTRAVENOUS | Status: AC
  Administered 2022-03-06: 1 g via INTRAVENOUS
  Filled 2022-03-06: qty 10

## 2022-03-06 MED ORDER — LORAZEPAM 2 MG/ML IJ SOLN
2.0000 mg | Freq: Once | INTRAMUSCULAR | Status: AC
  Administered 2022-03-06: 2 mg via INTRAVENOUS
  Filled 2022-03-06: qty 1

## 2022-03-06 MED ORDER — SODIUM CHLORIDE (PF) 0.9 % IJ SOLN
INTRAMUSCULAR | Status: AC
  Filled 2022-03-06: qty 50

## 2022-03-06 MED ORDER — MORPHINE SULFATE 15 MG PO TABS
15.0000 mg | ORAL_TABLET | ORAL | Status: DC | PRN
  Administered 2022-03-06: 15 mg via ORAL
  Filled 2022-03-06: qty 1
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
## 2022-03-06 NOTE — ED Notes (Signed)
Patient spoke with spouse about discharge. Wife requested for [facility] to transport home.

## 2022-03-06 NOTE — ED Triage Notes (Signed)
Pt arrived via EMS, from home, c/o increasing pain, hx of osteo, open wound to left hip.   Baseline 2L [place]  Currently has fentanyl patch on.

## 2022-03-06 NOTE — Discharge Instructions (Addendum)
Please schedule an appointment with Urology in order to follow up from your urinary tract infection. We discussed the results of your CT abdomen and pelvis. Your urine showed signs of infection, you were placed on biotics to help treat this infection.  Please take 1 tablet twice a day for the next 7 days.  If you experience any fever, chills, worsening symptoms please return to the emergency department.

## 2022-03-09 ENCOUNTER — Encounter
Payer: No Typology Code available for payment source | Attending: Physical Medicine and Rehabilitation | Admitting: Physical Medicine and Rehabilitation

## 2022-03-09 ENCOUNTER — Encounter: Payer: Self-pay | Admitting: Physical Medicine and Rehabilitation

## 2022-03-09 VITALS — BP 117/79 | HR 73 | Ht 75.0 in

## 2022-03-09 DIAGNOSIS — M8669 Other chronic osteomyelitis, multiple sites: Secondary | ICD-10-CM | POA: Diagnosis present

## 2022-03-09 DIAGNOSIS — F064 Anxiety 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: Secondary | ICD-10-CM | POA: Diagnosis present

## 2022-03-09 DIAGNOSIS — G709 Myoneural disorder, unspecified: Secondary | ICD-10-CM | POA: Diagnosis present

## 2022-03-09 LAB — URINE CULTURE: Culture: >=100000 — AB

## 2022-03-09 MED ORDER — MORPHINE SULFATE 15 MG PO TABS: 15.0000 mg | ORAL_TABLET | ORAL | 0 refills | Status: DC | PRN

## 2022-03-09 MED ORDER — FENTANYL 75 MCG/HR TD PT72: 1.0000 | MEDICATED_PATCH | TRANSDERMAL | 0 refills | Status: DC

## 2022-03-09 NOTE — Progress Notes (Signed)
Subjective:    Patient ID: Christopher [name], male    DOB: [birth year], 56 y.o.   MRN: [id number]  HPI  Pt is a 56 yr old male with hx of Multitrauma- causing L femoral neck fx, degloving of L hip to going/scrotum, bladder neck trauma- got SPC, developed compartment syndrome -s/p surgery for that; also diverting colostomy, skin grafts, and s/p trach and PEG- PEG is out. 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 S/P screw removal and on IV ABX for L hip osteomyelitis. 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  Has R severe ulnar neuropathy as well. O2 3L full time.   Things "so-so"- Getting anxious a lot for some reason- denies increased SOB Went to ED last week = had UTI- was acting erratic and in more pain.   Not more SOB- Seeing Dr Marks-psychiatry- last seen ~ 1 month ago-  Off Paxil and Abilify and Trazodone and Klonopin - on Zyprexa- more anxious- can give another 1/2 to 1 Zyprexa in AM.  On 10 mg- max dose is Zyprexa.   Has also tried to give Zyprexa up to 10 mg in AM. Can't tell a difference.    When laying down, gets more anxious - started Wednesday last week.  Sounds like when lays down , feels more SOB and feels like cannot breathe- so sits back up.   Also last week, saw was "blowing too much in his nose".   Earlier today c/o chest hurting and was rocking back and forth sitting EOB.  Freaked out when she was changing colostomy- panicking.   On PO ABX for UTI- E. coli sensitive to current Keflex dose.   Of note, also had elevated  AST/ALT and Alk phos Also freaked out with CT- given something like Ativan to calm down. Felt claustrophobic.    Asked him to space out MSIR to every 4 hours prn if possible- which wife was doing until anxiety got worse.   When gets anxious, pain gets worse.    and he referred with Dr Hardin Negus- psychology- apps for breathing and to manage pain.  Sees him tomorrow as well.       Pain Inventory Average Pain 9 Pain Right Now 9 My pain is sharp, burning, dull, stabbing, tingling, and aching  LOCATION OF PAIN  Back, Hip  BOWEL  History of colostomy Yes    BLADDER Suprapubic In and out cath, frequency monthly prn    Mobility use a wheelchair Do you have any goals in this area?  no  Function disabled: date disabled .  Neuro/Psych weakness numbness tremor tingling trouble walking spasms dizziness confusion depression anxiety  Prior Studies Any changes since last visit?  no  Physicians involved in your care Any changes since last visit?  no   Family History  Problem Relation Age of Onset   Breast cancer Mother        with mets to the bones   Social History   Socioeconomic History   Marital status: Married    Spouse name:  Not on file   Number of children: Not on file   Years of education: Not on file   Highest education level: Not on file  Occupational History   Occupation: Disable  Tobacco Use   Smoking status: Former    Packs/day: 1.00    Years: 20.00    Total pack years: 20.00    Types: Cigarettes    Quit date: 07/10/2018    Years since quitting: 3.6   Smokeless tobacco: Never  Vaping Use   Vaping Use: Never used  Substance and Sexual Activity   Alcohol use: Never   Drug use: Yes    Types: Oxycodone, Fentanyl    Comment: Fentanyl patch/oxycodone since 06/2018   Sexual activity: Yes  Other Topics Concern   Not on file  Social History Narrative   ** Merged History Encounter **       Social Determinants of Health   Financial  Resource Strain: Low Risk  (03/01/2019)   Overall Financial Resource Strain (CARDIA)    Difficulty of Paying Living Expenses: Not very hard  Food Insecurity: Food Insecurity Present (03/01/2019)   Hunger Vital Sign    Worried About Running Out of Food in the Last Year: Sometimes true    Ran Out of Food in the Last Year: Sometimes true  Transportation Needs: No Transportation Needs (03/01/2019)   PRAPARE - Hydrologist (Medical): No    Lack of Transportation (Non-Medical): No  Physical Activity: Inactive (03/01/2019)   Exercise Vital Sign    Days of Exercise per Week: 0 days    Minutes of Exercise per Session: 0 min  Stress: Stress Concern Present (03/01/2019)   [place]    Feeling of Stress : Rather much  Social Connections: Not on file   Past Surgical History:  Procedure Laterality Date   APPLICATION OF A-CELL OF BACK N/A 08/06/2018   Procedure: Application Of A-Cell Of Back;  Surgeon: [name], DO;  Location: [place];  Service: Plastics;  Laterality: N/A;   APPLICATION OF A-CELL OF EXTREMITY Left 08/06/2018   Procedure: Application Of A-Cell Of Extremity;  Surgeon: [name], DO;  Location: [place];  Service: Plastics;  Laterality: Left;   APPLICATION OF A-CELL OF EXTREMITY Left 09/18/2019   Procedure: APPLICATION OF A-CELL OF EXTREMITY;  Surgeon: [name], DO;  Location: [place];  Service: Plastics;  Laterality: Left;   APPLICATION OF WOUND VAC  07/12/2018   Procedure: Application Of Wound Vac to the Left Thigh and Scrotum.;  Surgeon: Shona Needles, MD;  Location: [place];  Service: Orthopedics;;   APPLICATION OF WOUND VAC  07/10/2018   Procedure: Application Of Wound Vac;  Surgeon: Clovis Riley, MD;  Location: [place];  Service: General;;   COLON SURGERY  2020   colostomy   COLOSTOMY N/A 07/23/2018   Procedure: COLOSTOMY;  Surgeon: Georganna Skeans, MD;  Location:  [place];  Service: General;  Laterality: N/A;   CYSTOSCOPY W/ URETERAL STENT PLACEMENT N/A 07/15/2018   Procedure: RETROGRADE URETHROGRAM;  Surgeon: Franchot Gallo, MD;  Location: [place];  Service: Urology;  Laterality: N/A;   CYSTOSCOPY WITH LITHOLAPAXY N/A 05/06/2019   Procedure: CYSTOSCOPY BASKET BLADDER STONE EXTRACTION;  Surgeon: Cleon Gustin, MD;  Location: [facility];  Service: Urology;  Laterality: N/A;  30 MINS   CYSTOSTOMY N/A 05/06/2019   Procedure: REPLACEMENT OF SUPRAPUBIC CATHETER;  Surgeon: Cleon Gustin, MD;  Location: [place];  Service: Urology;  Laterality: N/A;   DEBRIDEMENT AND CLOSURE WOUND Left 03/04/2019   Procedure: Excision of hip wound with placement of Acell;  Surgeon: [name], DO;  Location: [place];  Service: Plastics;  Laterality: Left;   ESOPHAGOGASTRODUODENOSCOPY N/A 08/14/2018   Procedure: ESOPHAGOGASTRODUODENOSCOPY (EGD);  Surgeon: Georganna Skeans, MD;  Location: [place];  Service: General;  Laterality: N/A;  bedside   FACIAL RECONSTRUCTION SURGERY     X 2--once as a teenager and second time in his 39's   HARDWARE REMOVAL Left 03/04/2019   Procedure: Left Hip Hardware Removal;  Surgeon: Shona Needles, MD;  Location: [place];  Service: Orthopedics;  Laterality: Left;   HIP PINNING,CANNULATED Left 07/12/2018   Procedure: CANNULATED HIP PINNING;  Surgeon: Shona Needles, MD;  Location: [place];  Service: Orthopedics;  Laterality: Left;   HIP SURGERY     HOLMIUM LASER APPLICATION Right 08/17/1222   Procedure: HOLMIUM LASER APPLICATION;  Surgeon: Cleon Gustin, MD;  Location: [facility];  Service: Urology;  Laterality: Right;   I & D EXTREMITY Left 07/25/2018   Procedure: Debridement of buttock, scrotum and left leg, placement of acell and vac;  Surgeon: [name], DO;  Location: [place];  Service: Plastics;  Laterality: Left;   I & D EXTREMITY N/A 08/06/2018   Procedure:  Debridement of buttock, scrotum and left leg;  Surgeon: [name], DO;  Location: [place];  Service: Plastics;  Laterality: N/A;   I & D EXTREMITY N/A 08/13/2018   Procedure: Debridement of buttock, scrotum and left leg, placement of acell and vac;  Surgeon: [name], DO;  Location: [place];  Service: Plastics;  Laterality: N/A;  90 min, please   INCISION AND DRAINAGE HIP Left 09/18/2019   Procedure: IRRIGATION AND DEBRIDEMENT HIP/ PELVIS WITH WOUND VAC PLACEMENT;  Surgeon: Shona Needles, MD;  Location: [place];  Service: Orthopedics;  Laterality: Left;   INCISION AND DRAINAGE OF WOUND N/A 07/18/2018   Procedure: Debridement of left leg, buttocks and scrotal wound with placement of acell and Flexiseal;  Surgeon: [name], DO;  Location: [place];  Service: Plastics;  Laterality: N/A;   INCISION AND DRAINAGE OF WOUND Left 08/29/2018   Procedure: Debridement of buttock, scrotum and left leg, placement of acell and vac;  Surgeon: [name], DO;  Location: [place];  Service: Plastics;  Laterality: Left;  75 min, please   INCISION AND DRAINAGE OF WOUND Bilateral 10/23/2018   Procedure: DEBRIDEMENT OF BUTTOCK,SCROTUM, AND LEG WOUNDS WITH PLACEMENT OF ACELL- BILATERAL 90 MIN;  Surgeon: [name], DO;  Location: [place];  Service: Plastics;  Laterality: Bilateral;   IR ANGIOGRAM PELVIS SELECTIVE OR SUPRASELECTIVE  07/10/2018   IR ANGIOGRAM PELVIS SELECTIVE OR SUPRASELECTIVE  07/10/2018   IR ANGIOGRAM SELECTIVE EACH ADDITIONAL VESSEL  07/10/2018   IR EMBO ART  VEN HEMORR LYMPH EXTRAV  INC GUIDE ROADMAPPING  07/10/2018   IR GASTROSTOMY TUBE MOD SED  01/21/2021   IR NEPHROSTOMY PLACEMENT LEFT  04/05/2019   IR NEPHROSTOMY PLACEMENT RIGHT  05/31/2019   IR US GUIDE BX ASP/DRAIN  07/10/2018   IR US GUIDE VASC ACCESS RIGHT  07/10/2018   IR VENO/EXT/UNI LEFT  07/10/2018   IRRIGATION AND DEBRIDEMENT OF WOUND WITH SPLIT THICKNESS SKIN GRAFT Left 09/19/2018   Procedure: Debridement of  gluteal wound with placement of acell to left leg.;  Surgeon: [name], DO;  Location: [place];  Service: Plastics;  Laterality: Left;  2.5 hours, please   LAPAROTOMY N/A 07/12/2018   Procedure: EXPLORATORY LAPAROTOMY;  Surgeon: Georganna Skeans, MD;  Location: [place];  Service: General;  Laterality: N/A;   LAPAROTOMY N/A 07/15/2018   Procedure: WOUND EXPLORATION; CLOSURE OF ABDOMEN;  Surgeon: Georganna Skeans, MD;  Location: [place];  Service: General;  Laterality: N/A;   LAPAROTOMY  07/10/2018   Procedure: Exploratory Laparotomy;  Surgeon: Clovis Riley, MD;  Location: [place];  Service: General;;   MASS EXCISION Left 09/18/2019   Procedure: EXCISION UPPER LEFT INNER THIGH WOUND;  Surgeon: [name], DO;  Location: [place];  Service: Plastics;  Laterality: Left;   NEPHROLITHOTOMY Right 07/15/2019   Procedure: NEPHROLITHOTOMY PERCUTANEOUS;  Surgeon: Cleon Gustin, MD;  Location: [facility];  Service: Urology;  Laterality: Right;  90 MINS   PEG PLACEMENT N/A 08/14/2018   Procedure: PERCUTANEOUS ENDOSCOPIC GASTROSTOMY (PEG) PLACEMENT;  Surgeon: Georganna Skeans, MD;  Location: [place];  Service: General;  Laterality: N/A;   PERCUTANEOUS TRACHEOSTOMY N/A 08/02/2018   Procedure: PERCUTANEOUS TRACHEOSTOMY;  Surgeon: Georganna Skeans, MD;  Location: [place];  Service: General;  Laterality: N/A;   RADIOLOGY WITH ANESTHESIA N/A 07/10/2018   Procedure: IR WITH ANESTHESIA;  Surgeon: Sandi Mariscal, MD;  Location: [place];  Service: Radiology;  Laterality: N/A;   RADIOLOGY WITH ANESTHESIA Right 07/10/2018   Procedure: Ir With Anesthesia;  Surgeon: Sandi Mariscal, MD;  Location: [place];  Service: Radiology;  Laterality: Right;   SCROTAL EXPLORATION N/A 07/15/2018   Procedure: SCROTUM DEBRIDEMENT;  Surgeon: Franchot Gallo, MD;  Location: [place];  Service: Urology;  Laterality: N/A;   SHOULDER SURGERY     SKIN SPLIT GRAFT Right 09/19/2018   Procedure: Skin Graft Split Thickness;  Surgeon:  [name], DO;  Location: [place];  Service: Plastics;  Laterality: Right;   SKIN SPLIT GRAFT N/A 10/03/2018   Procedure: Split thickness skin graft to gluteal area with acell placement;  Surgeon: [name], DO;  Location: [place];  Service: Plastics;  Laterality: N/A;  3 hours, please   VACUUM ASSISTED CLOSURE CHANGE N/A 07/12/2018   Procedure: ABDOMINAL VACUUM ASSISTED CLOSURE CHANGE and abdominal washout;  Surgeon: Georganna Skeans, MD;  Location: [place];  Service: General;  Laterality: N/A;   WOUND DEBRIDEMENT Left 07/23/2018   Procedure: DEBRIDEMENT LEFT BUTTOCK  WOUND;  Surgeon: Georganna Skeans, MD;  Location: [place];  Service: General;  Laterality: Left;   WOUND EXPLORATION Left 07/10/2018   Procedure: WOUND EXPLORATION LEFT GROIN;  Surgeon: Clovis Riley, MD;  Location: [place] [facility];  Service: General;  Laterality: Left;   Past Medical History:  Diagnosis Date   Acute on chronic respiratory failure with hypoxia ([facility]) 06/2018   trach removed 11-16-2018, on vent from [place] until may 2020 - uses albuterol prn   Anxiety    Bacteremia due to Pseudomonas 06/2018   Chronic osteomyelitis ([place])    Chronic pain syndrome    Clostridium difficile colitis 10/30/2019   tx with abx    Depression    DVT (deep venous thrombosis) ([place]) 2020   right brachial post PICC line   History of blood transfusion 06/2018   History of Clostridioides difficile colitis    History of kidney stones    Hypertension    norvasc d/c by pcp on 11/05/19   Multiple traumatic injuries    Penile pain 11/18/2019   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   Wound discharge    left hip wound with bloody/clear drainage change dressing q day surgilube with gauze, between legs wound using calcium algenate pad bid   BP 117/79   Pulse 73   Ht '6\' 3"'  (1.905 m)   SpO2 98% Comment: 3 liters of 02  BMI 20.00 kg/m   Opioid Risk Score:   Fall Risk Score:  `1  Depression  screen [facility] 2/9     01/12/2022   11:17 AM 11/17/2021   11:30 AM 10/06/2021   11:28 AM 08/13/2021   11:33 AM 07/05/2021    2:39 PM 05/28/2021    3:00 PM 05/10/2021   10:30 AM  Depression screen PHQ 2/9  Decreased Interest 0 '1 1 1 ' 0 1 1  Down, Depressed, Hopeless '1 1 1 1 ' 0 1 1  PHQ - 2 Score '1 2 2 2 ' 0 2 2      Review of Systems  Musculoskeletal:  Positive for back pain.  All other systems reviewed and are negative.     Objective:   Physical Exam  Awake, alert, accompanied by wife, on 3L O2 by [place] CTA B/L- slightly prolonged expiration vs previous times I've listened to him.  RRR Colostomy good output (+) SPC  Reviewed labs from 9/24 ED visit- kidneys doing well; LFTs slightly elevated as well as Alk Phos; and U/A (+)- as well as Cx.        Assessment & Plan:   Pt is a 56 yr old male with hx of Multitrauma- causing L femoral neck fx, degloving of L hip to going/scrotum, bladder neck trauma- got SPC, developed compartment syndrome -s/p surgery for that; also diverting colostomy, skin grafts, and s/p trach and PEG- PEG is out. 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 S/P screw removal and on IV ABX for L hip osteomyelitis. 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  Has R severe ulnar neuropathy as well.  If Zyprexa is 10 mg nightly, then I suggest taking Zyprexa 5 mg in AM if needed for anxiety.  Since Dr Luetta Nutting had mentioned that. Suggest calling Dr Luetta Nutting and giving him heads up about increase in anxiety and lack of response to Zyprexa and see  if he has any other ideas.    2.  Is also seeing Dr Hardin Negus for Breathing exercises, etc.    3. Would make sure PCP rechecks CMP to make sure LFT's are doing better esp alk phos.   4.  Try increasing O2 to 4L when gets real anxious- and see if helps, before giving more pain meds, etc. Also give albuterol because that could help as well- using nebulizer.  Likely not PE, since HR is 74 today- not impossible, but less likely.  (Has severe COPD per Pulmonary)  5. Refill MSIR 15 mg q3 hours as needed- #240   6. Refill Duragesic 75 mcg every other day #15- is due 03/17/22  7. Con't gabapentin, Robaxin, doesn't need refills  today  8. F/U - 6 weeks as scheduled. Double appt   I spent a total of  36 minutes on total care today- >50% coordination of care- due to prolonged time with Case mgr and discussing ways to treat anxiety.

## 2022-03-09 NOTE — Patient Instructions (Signed)
Pt is a 56 yr old male with hx of Multitrauma- causing L femoral neck fx, degloving of L hip to going/scrotum, bladder neck trauma- got SPC, developed compartment syndrome -s/p surgery for that; also diverting colostomy, skin grafts, and s/p trach and PEG- PEG is out. 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 S/P screw removal and on IV ABX for L hip osteomyelitis. 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  Has R severe ulnar neuropathy as well.  If Zyprexa is 10 mg nightly, then I suggest taking Zyprexa 5 mg in AM if needed for anxiety.  Since Dr Luetta Nutting had mentioned that. Suggest calling Dr Luetta Nutting and giving him heads up about increase in anxiety and lack of response to Zyprexa and see if he has any other ideas.    2.  Is also seeing Dr Hardin Negus for Breathing exercises, etc.    3. Would make sure PCP rechecks CMP to make sure LFT's are doing better esp alk phos.   4.  Try increasing O2 to 4L when gets real anxious- and see if helps, before giving more pain meds, etc. Also give albuterol because that could help as well- using nebulizer.  Likely not PE, since HR is 74 today- not impossible, but less likely.  (Has severe COPD per Pulmonary)  5. Refill MSIR 15 mg q3 hours as needed- #240   6. Refill Duragesic 75 mcg every other day #15- is due 03/17/22  7. Con't gabapentin, Robaxin, doesn't need refills today  8. F/U - 6 weeks as scheduled. Double appt

## 2022-03-11 ENCOUNTER — Telehealth (HOSPITAL_BASED_OUTPATIENT_CLINIC_OR_DEPARTMENT_OTHER): Payer: Self-pay | Admitting: Emergency Medicine

## 2022-03-11 ENCOUNTER — Telehealth: Payer: Self-pay | Admitting: Gastroenterology

## 2022-03-11 LAB — CULTURE, BLOOD (ROUTINE X 2)
Culture: NO GROWTH
Culture: NO GROWTH
Special Requests: ADEQUATE
Special Requests: ADEQUATE

## 2022-03-11 NOTE — Telephone Encounter (Signed)
Patient's wife called and siad the patients peg tube came out and he ha not used it in over a year. Would like to know if it is okay from him to let it close.

## 2022-03-11 NOTE — Telephone Encounter (Signed)
Glad to hear he has not needed the tube.  Since that is the case, the site can close on its own. Keep some gauze over the site for about a week.  If it does not seem to be closing well or if there is persistent leakage from the site, she must contact the Interventional Radiology department because they placed the tube.  - HD

## 2022-03-11 NOTE — Telephone Encounter (Signed)
Post ED Visit - Positive Culture Follow-up  Culture report reviewed by antimicrobial stewardship pharmacist: [place] Team []  Nathan Batchelder, Pharm.D. []  [address], Pharm.D., BCPS AQ-ID []  Heide Guile, Pharm.D., BCPS []  Parks Neptune, Pharm.D., BCPS []  [place], Pharm.D., BCPS, AAHIVP []  [place], Pharm.D., BCPS, AAHIVP []  Legrand Como, PharmD, BCPS []  Salome Arnt, PharmD, BCPS []  Johnnette Gourd, PharmD, BCPS []  Hughes Better, PharmD []  Leeroy Cha, PharmD, BCPS []  Laqueta Linden, PharmD  [place] Team []  [address], PharmD []  Leodis Sias, PharmD []  Lindell Spar, PharmD []  Royetta Asal, Rph []  Graylin Shiver) Rema Fendt, PharmD []  Glennon Mac, PharmD []  Arlyn Dunning, PharmD []  Netta Cedars, PharmD []  Dia Sitter, PharmD []  Leone Haven, PharmD []  Gretta Arab, PharmD []  Theodis Shove, PharmD [x]  Peggyann Juba, PharmD   Positive urine culture Treated with Cephalexin, organism sensitive to the same and no further patient follow-up is required at this time.  Titus Dubin Hemi Chacko 03/11/2022, 5:29 PM

## 2022-03-11 NOTE — Telephone Encounter (Signed)
Returned call to patient's wife. We reviewed Dr. Loletha Carrow' recommendations as outlined below. Pt's wife states that she has been keeping it covered. They have not noticed any leakage. Pt's wife has been advised to contact IR is site is not healing well. Pt's wife verbalized understanding and had no concerns at the end of the call.

## 2022-03-22 ENCOUNTER — Ambulatory Visit: Payer: PRIVATE HEALTH INSURANCE | Admitting: Family Medicine

## 2022-03-23 ENCOUNTER — Ambulatory Visit: Payer: PRIVATE HEALTH INSURANCE | Admitting: Family Medicine

## 2022-03-29 ENCOUNTER — Other Ambulatory Visit: Payer: Self-pay | Admitting: Physical Medicine and Rehabilitation

## 2022-03-31 ENCOUNTER — Encounter: Payer: Self-pay | Admitting: Family Medicine

## 2022-03-31 ENCOUNTER — Ambulatory Visit (INDEPENDENT_AMBULATORY_CARE_PROVIDER_SITE_OTHER): Payer: PRIVATE HEALTH INSURANCE | Admitting: Family Medicine

## 2022-03-31 ENCOUNTER — Other Ambulatory Visit (INDEPENDENT_AMBULATORY_CARE_PROVIDER_SITE_OTHER): Payer: PRIVATE HEALTH INSURANCE

## 2022-03-31 VITALS — BP 126/85 | HR 75 | Temp 97.5°F

## 2022-03-31 DIAGNOSIS — F321 Major depressive disorder, single episode, moderate: Secondary | ICD-10-CM

## 2022-03-31 DIAGNOSIS — G8921 Chronic pain due to trauma: Secondary | ICD-10-CM

## 2022-03-31 DIAGNOSIS — F064 Anxiety 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: Secondary | ICD-10-CM

## 2022-03-31 DIAGNOSIS — R7989 Other specified abnormal findings of blood chemistry: Secondary | ICD-10-CM | POA: Diagnosis not present

## 2022-03-31 LAB — CBC
HCT: 39.2 % (ref 39.0–52.0)
Hemoglobin: 13.2 g/dL (ref 13.0–17.0)
MCHC: 33.6 g/dL (ref 30.0–36.0)
MCV: 95.1 fl (ref 78.0–100.0)
Platelets: 206 10*3/uL (ref 150.0–400.0)
RBC: 4.12 Mil/uL — ABNORMAL LOW (ref 4.22–5.81)
RDW: 13 % (ref 11.5–15.5)
WBC: 4.3 10*3/uL (ref 4.0–10.5)

## 2022-03-31 LAB — COMPREHENSIVE METABOLIC PANEL
ALT: 60 U/L — ABNORMAL HIGH (ref 0–53)
AST: 35 U/L (ref 0–37)
Albumin: 4.1 g/dL (ref 3.5–5.2)
Alkaline Phosphatase: 165 U/L — ABNORMAL HIGH (ref 39–117)
BUN: 13 mg/dL (ref 6–23)
CO2: 29 mEq/L (ref 19–32)
Calcium: 9.8 mg/dL (ref 8.4–10.5)
Chloride: 103 mEq/L (ref 96–112)
Creatinine, Ser: 0.64 mg/dL (ref 0.40–1.50)
GFR: 106.58 mL/min (ref >60.00)
Glucose, Bld: 78 mg/dL (ref 70–99)
Potassium: 4 mEq/L (ref 3.5–5.1)
Sodium: 137 mEq/L (ref 135–145)
Total Bilirubin: 0.4 mg/dL (ref 0.2–1.2)
Total Protein: 7.6 g/dL (ref 6.0–8.3)

## 2022-03-31 MED ORDER — ALBUTEROL SULFATE HFA 108 (90 BASE) MCG/ACT IN AERS: INHALATION_SPRAY | RESPIRATORY_TRACT | 10 refills | Status: DC

## 2022-03-31 NOTE — Assessment & Plan Note (Signed)
Not controlled though improving.  Will defer further management to his cardiologist.  Recently started on Ativan.

## 2022-03-31 NOTE — Addendum Note (Signed)
Addended by: Doran Clay A on: 03/31/2022 11:22 AM   Modules accepted: Orders

## 2022-03-31 NOTE — Patient Instructions (Signed)
It was very nice to see you today!  I am glad that you are doing well.  No medication changes today.  We will check blood work.  Come back to see me in 6 months.  Come back sooner if needed.  Take care, Dr Jerline Pain  PLEASE NOTE:  If you had any lab tests please let us know if you have not heard back within a few days. You may see your results on mychart before we have a chance to review them but we will give you a call once they are reviewed by [place]. If we ordered any referrals today, please let us know if you have not heard from their office within the next week.   Please try these tips to maintain a healthy lifestyle:  Eat at least 3 REAL meals and 1-2 snacks per day.  Aim for no more than 5 hours between eating.  If you eat breakfast, please do so within one hour of getting up.   Each meal should contain half fruits/vegetables, one quarter protein, and one quarter carbs (no bigger than a computer mouse)  Cut down on sweet beverages. This includes juice, soda, and sweet tea.   Drink at least 1 glass of water with each meal and aim for at least 8 glasses per day  Exercise at least 150 minutes every week.

## 2022-03-31 NOTE — Progress Notes (Signed)
Christopher [name] is a 56 y.o. male who presents today for an office visit.  Assessment/Plan:  New/Acute Problems: Elevated LFTs Possibly medication side effect.  We will recheck CBC and c-Met today.  Chronic Problems Addressed Today: Depression, major, single episode, moderate (HCC) Overall stable.  Continue management per psychiatry.  He is on Paxil 40 mg daily, Zyprexa 10 mg nightly and 2.5 mg twice daily.   Chronic pain due to trauma Continue management by physical medicine.  Pain seems to be reasonably well controlled today.  Anxiety 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 Not controlled though improving.  Will defer further management to his cardiologist.  Recently started on Ativan.  Follow up in 6 months.     Subjective:  HPI:  Patient here for follow-up.  See A/P for separate chronic conditions.  Since our last visit he has followed with palliative care, psychiatry, psychology,pulmonology, wound management, and physical medicine.  Main concern today is anxiety.  Is been following with psychiatry for this.  He has had a few medication changes.  He is currently on Zyprexa 10 mg at night and 2.5 mg twice daily, gabapentin 300 mg 3 times daily, Paxil 40 mg daily, and Ativan 0.5 mg 3 times daily.  Feels like the anxiety seems to be improving a little bit more over the last few days.   Recently instantly found to have mildly elevated alk phos and LFTs.      Objective:  Physical Exam: BP 126/85   Pulse 75   Temp (!) 97.5 F (36.4 C) (Temporal)   SpO2 95%   Gen: No acute distress, resting comfortably CV: Regular rate and rhythm with no murmurs appreciated Pulm: Normal work of breathing, clear to auscultation bilaterally with no crackles, wheezes, or rhonchi Neuro: In wheelchair Psych: Normal affect and thought content       M. , MD 03/31/2022 11:10 AM   

## 2022-03-31 NOTE — Assessment & Plan Note (Signed)
Continue management by physical medicine.  Pain seems to be reasonably well controlled today.

## 2022-03-31 NOTE — Assessment & Plan Note (Signed)
Overall stable.  Continue management per psychiatry.  He is on Paxil 40 mg daily, Zyprexa 10 mg nightly and 2.5 mg twice daily.

## 2022-04-05 NOTE — Progress Notes (Signed)
Please inform patient of the following:  Labs are stable. We can recheck in 6-12 months.  Algis Greenhouse. Jerline Pain, MD 04/05/2022 12:18 PM

## 2022-04-11 ENCOUNTER — Ambulatory Visit: Payer: PRIVATE HEALTH INSURANCE | Admitting: Family

## 2022-04-13 ENCOUNTER — Telehealth: Payer: Self-pay | Admitting: Family Medicine

## 2022-04-13 NOTE — Telephone Encounter (Signed)
..  Home Health Certification or Plan of Care Tracking  Is this a Certification or Plan of Care? Yes  [place] Agency: [place] Number:  [id number]  Has charge sheet been attached? yes  Where has form been placed:  In provider's box  Faxed to:   [phone]

## 2022-04-14 ENCOUNTER — Ambulatory Visit: Payer: Self-pay | Admitting: Family

## 2022-04-14 DIAGNOSIS — F419 Anxiety disorder, unspecified: Secondary | ICD-10-CM

## 2022-04-14 DIAGNOSIS — K5903 Drug induced constipation: Secondary | ICD-10-CM

## 2022-04-14 DIAGNOSIS — I700 Atherosclerosis of aorta: Secondary | ICD-10-CM

## 2022-04-14 DIAGNOSIS — G4700 Insomnia, unspecified: Secondary | ICD-10-CM

## 2022-04-14 DIAGNOSIS — Z87891 Personal history of nicotine dependence: Secondary | ICD-10-CM

## 2022-04-14 DIAGNOSIS — J9611 Chronic respiratory failure with hypoxia: Secondary | ICD-10-CM

## 2022-04-14 DIAGNOSIS — Z466 Encounter for fitting and adjustment of urinary device: Secondary | ICD-10-CM

## 2022-04-14 DIAGNOSIS — G894 Chronic pain syndrome: Secondary | ICD-10-CM

## 2022-04-14 DIAGNOSIS — K219 Gastro-esophageal reflux disease without esophagitis: Secondary | ICD-10-CM

## 2022-04-14 DIAGNOSIS — I10 Essential (primary) hypertension: Secondary | ICD-10-CM

## 2022-04-14 DIAGNOSIS — R339 Retention of urine, unspecified: Secondary | ICD-10-CM

## 2022-04-14 DIAGNOSIS — Z9981 Dependence on supplemental oxygen: Secondary | ICD-10-CM

## 2022-04-14 DIAGNOSIS — F1120 Opioid dependence, uncomplicated: Secondary | ICD-10-CM

## 2022-04-14 DIAGNOSIS — Z993 Dependence on wheelchair: Secondary | ICD-10-CM

## 2022-04-14 DIAGNOSIS — Z79891 Long term (current) use of opiate analgesic: Secondary | ICD-10-CM

## 2022-04-14 DIAGNOSIS — Z933 Colostomy status: Secondary | ICD-10-CM

## 2022-04-14 DIAGNOSIS — T50905D Adverse effect of unspecified drugs, medicaments and biological substances, subsequent encounter: Secondary | ICD-10-CM

## 2022-04-14 DIAGNOSIS — G8220 Paraplegia, unspecified: Secondary | ICD-10-CM

## 2022-04-14 DIAGNOSIS — Z435 Encounter for attention to cystostomy: Secondary | ICD-10-CM

## 2022-04-14 DIAGNOSIS — R1310 Dysphagia, unspecified: Secondary | ICD-10-CM

## 2022-04-14 DIAGNOSIS — E43 Unspecified severe protein-calorie malnutrition: Secondary | ICD-10-CM

## 2022-04-14 DIAGNOSIS — E876 Hypokalemia: Secondary | ICD-10-CM

## 2022-04-14 DIAGNOSIS — M86652 Other chronic osteomyelitis, left thigh: Secondary | ICD-10-CM

## 2022-04-14 DIAGNOSIS — L89152 Pressure ulcer of sacral region, stage 2: Secondary | ICD-10-CM

## 2022-04-14 DIAGNOSIS — Z87440 Personal history of urinary (tract) infections: Secondary | ICD-10-CM

## 2022-04-14 DIAGNOSIS — Z79899 Other long term (current) drug therapy: Secondary | ICD-10-CM

## 2022-04-14 DIAGNOSIS — F32A Depression, unspecified: Secondary | ICD-10-CM

## 2022-04-14 DIAGNOSIS — D649 Anemia, unspecified: Secondary | ICD-10-CM

## 2022-04-14 NOTE — Telephone Encounter (Signed)
Form Faxed to [phone] and Placed on box to be scan in patient chart

## 2022-04-18 ENCOUNTER — Telehealth: Payer: Self-pay | Admitting: Physical Medicine and Rehabilitation

## 2022-04-18 MED ORDER — MORPHINE SULFATE 15 MG PO TABS: 15.0000 mg | ORAL_TABLET | ORAL | 0 refills | Status: DC | PRN

## 2022-04-18 MED ORDER — FENTANYL 75 MCG/HR TD PT72: 1.0000 | MEDICATED_PATCH | TRANSDERMAL | 0 refills | Status: DC

## 2022-04-18 NOTE — Telephone Encounter (Signed)
Requesting prescription for Morphine and Fentanyl

## 2022-04-20 ENCOUNTER — Encounter: Payer: No Typology Code available for payment source | Admitting: Physical Medicine and Rehabilitation

## 2022-04-21 ENCOUNTER — Ambulatory Visit (INDEPENDENT_AMBULATORY_CARE_PROVIDER_SITE_OTHER): Payer: No Typology Code available for payment source | Admitting: Family

## 2022-04-21 ENCOUNTER — Encounter: Payer: Self-pay | Admitting: Family

## 2022-04-21 ENCOUNTER — Other Ambulatory Visit: Payer: Self-pay

## 2022-04-21 VITALS — BP 132/86 | HR 92 | Temp 98.4°F

## 2022-04-21 DIAGNOSIS — F32A Depression, unspecified: Secondary | ICD-10-CM | POA: Diagnosis not present

## 2022-04-21 DIAGNOSIS — F419 Anxiety disorder, unspecified: Secondary | ICD-10-CM

## 2022-04-21 DIAGNOSIS — M8668 Other chronic osteomyelitis, other site: Secondary | ICD-10-CM | POA: Diagnosis not present

## 2022-04-21 MED ORDER — CIPROFLOXACIN HCL 500 MG PO TABS: 500.0000 mg | ORAL_TABLET | Freq: Two times a day (BID) | ORAL | 11 refills | Status: DC

## 2022-04-21 MED ORDER — VANCOMYCIN HCL 125 MG PO CAPS: 125.0000 mg | ORAL_CAPSULE | Freq: Two times a day (BID) | ORAL | 11 refills | Status: DC

## 2022-04-21 NOTE — Assessment & Plan Note (Signed)
Having increased levels of anxiety and now working with psychiatry to help manage.

## 2022-04-21 NOTE — Patient Instructions (Signed)
Nice to see you. ? ?We will check your lab work today. ? ?Continue to take your medication daily as prescribed. ? ?Refills have been sent to the pharmacy. ? ?Plan for follow up in 6 months or sooner if needed with lab work on the same day. ? ?Have a great day and stay safe! ? ?

## 2022-04-21 NOTE — Progress Notes (Signed)
Subjective:    Patient ID: Christopher [name], male    DOB: 1965-10-13, 56 y.o.   MRN: [id number]  Chief Complaint  Patient presents with   Follow-up    Chronic osteomyelitis of pelvis, left - patient reports he isn't feeling well today - having nausea. Denies vomiting.     HPI:  Christopher [name] is a 56 y.o. male with history of multiple pelvic fractures sustained from trauma complicated by hardware associated osteomyelitis status post hardware and excision of bone with cultures positive for multidrug-resistant Pseudomonas maintained on ciprofloxacin and oral vancomycin last seen on 09/29/2021 with good tolerance and adherence to ciprofloxacin and oral vancomycin.  Here today for routine follow-up.  Christopher [name] has been doing well since his last office visit although having some nausea today.  Has also been having increased levels of anxiety which he is working with psychiatry for.  Oral intake continues to be good and has since removed his G-tube as they are no longer using this.  The chronic wound has stayed about the same with waxing and waning drainage and no evidence of infection.  Was recently seen in the ED with CT scan showing no evidence of acute abnormality of the abdomen/pelvis.  Continues to take ciprofloxacin and oral vancomycin with no adverse side effects.   Allergies  Allergen Reactions   Methadone Other (See Comments)    Hallucinations/confusion      Outpatient Medications Prior to Visit  Medication Sig Dispense Refill   albuterol (VENTOLIN HFA) 108 (90 Base) MCG/ACT inhaler USE 2 INHALATIONS EVERY 6 HOURS AS NEEDED FOR WHEEZING OR SHORTNESS OF BREATH 18 g 10   Blood Pressure Monitoring (BLOOD PRESSURE CUFF) MISC Use daily as needed to check blood pressure. 1 each 0   dextromethorphan-guaiFENesin (MUCINEX DM) 30-600 MG 12hr tablet Take 2 tablets by mouth 2 (two) times daily.     famotidine (PEPCID) 40 MG tablet TAKE 1 TABLET BY MOUTH TWICE A DAY (Patient taking  differently: Take 40 mg by mouth 2 (two) times daily.) 180 tablet 1   fentaNYL (DURAGESIC) 75 MCG/HR Place 1 patch onto the skin every other day. 15 patch 0   gabapentin (NEURONTIN) 300 MG capsule TAKE 1 CAPSULE BY MOUTH THREE TIMES A DAY 90 capsule 5   lidocaine (LIDODERM) 5 % APPLY 3 PATCHES TO SKIN EVERY 12 HOURS AND DISCARD WITHING 12 HOURS OR AS DIRECTED 90 patch 5   linaclotide (LINZESS) 72 MCG capsule Take 2 capsules (145 mcg total) by mouth daily before breakfast. 30 capsule 5   LORazepam (ATIVAN) 0.5 MG tablet Take 0.5 mg by mouth 3 (three) times daily.     melatonin ([employer] MELATONIN) 3 MG TABS tablet Take 1 tablet (3 mg total) by mouth at bedtime. 10 tablet 0   methocarbamol (ROBAXIN) 500 MG tablet Take 1 tablet (500 mg total) by mouth every 6 (six) hours as needed for muscle spasms. 120 tablet 5   morphine (MSIR) 15 MG tablet Take 1 tablet (15 mg total) by mouth every 3 (three) hours as needed for severe pain. 240 tablet 0   Multiple Vitamin (MULTIVITAMIN WITH MINERALS) TABS tablet Take 1 tablet by mouth daily.     naloxone (NARCAN) nasal spray 4 mg/0.1 mL To use if pt develops unconsciousness or confusion that family thinks is related to opioids. 1 each 3   OLANZapine (ZYPREXA) 10 MG tablet Take 10 mg by mouth at bedtime.     OLANZapine (ZYPREXA) 2.5 MG tablet Take 2.5 mg by  mouth in the morning and at bedtime.     oxybutynin (DITROPAN) 5 MG tablet Take 5 mg by mouth See admin instructions. Take 5 mg by mouth three times a day and an additional 5 mg once daily as needed for urinary urgency     PARoxetine (PAXIL) 40 MG tablet Take 40 mg by mouth daily.     Probiotic Product (PROBIOTIC COLON SUPPORT) CAPS Take 1 capsule by mouth daily.     promethazine (PHENERGAN) 12.5 MG tablet TAKE 1 TABLET (12.5 MG TOTAL) BY MOUTH EVERY 6 (SIX) HOURS AS NEEDED FOR NAUSEA, VOMITING OR REFRACTORY NAUSEA / VOMITING. 90 tablet 5   senna-docusate (SENOKOT-S) 8.6-50 MG tablet Take 2 tablets by mouth 3 (three)  times daily.     umeclidinium-vilanterol (ANORO ELLIPTA) 62.5-25 MCG/ACT AEPB Inhale 1 puff into the lungs daily. 1 each 5   ciprofloxacin (CIPRO) 500 MG tablet Take 500 mg by mouth 2 (two) times daily.     vancomycin (VANCOCIN) 125 MG capsule Take by mouth.     No facility-administered medications prior to visit.     Past Medical History:  Diagnosis Date   Acute on chronic respiratory failure with hypoxia ([place]) 06/2018   trach removed 11-16-2018, on vent from jan until may 2020 - uses albuterol prn   Anxiety    Bacteremia due to Pseudomonas 06/2018   Chronic osteomyelitis (HCC)    Chronic pain syndrome    Clostridium difficile colitis 10/30/2019   tx with abx    Depression    DVT (deep venous thrombosis) ([place]) 2020   right brachial post PICC line   History of blood transfusion 06/2018   History of Clostridioides difficile colitis    History of kidney stones    Hypertension    norvasc d/c by pcp on 11/05/19   Multiple traumatic injuries    Penile pain 11/18/2019   Pneumonia 11/2009   2020 x 2   Walker as ambulation aid    Wheelchair bound    electric   Wound discharge    left hip wound with bloody/clear drainage change dressing q day surgilube with gauze, between legs wound using calcium algenate pad bid     Past Surgical History:  Procedure Laterality Date   APPLICATION OF A-CELL OF BACK N/A 08/06/2018   Procedure: Application Of A-Cell Of Back;  Surgeon: [name], DO;  Location: [place];  Service: Plastics;  Laterality: N/A;   APPLICATION OF A-CELL OF EXTREMITY Left 08/06/2018   Procedure: Application Of A-Cell Of Extremity;  Surgeon: [name], DO;  Location: [place];  Service: Plastics;  Laterality: Left;   APPLICATION OF A-CELL OF EXTREMITY Left 09/18/2019   Procedure: APPLICATION OF A-CELL OF EXTREMITY;  Surgeon: [name], DO;  Location: [place];  Service: Plastics;  Laterality: Left;   APPLICATION OF WOUND VAC  07/12/2018   Procedure:  Application Of Wound Vac to the Left Thigh and Scrotum.;  Surgeon: Shona Needles, MD;  Location: [facility];  Service: Orthopedics;;   APPLICATION OF WOUND VAC  07/10/2018   Procedure: Application Of Wound Vac;  Surgeon: Clovis Riley, MD;  Location: [place];  Service: General;;   COLON SURGERY  2020   colostomy   COLOSTOMY N/A 07/23/2018   Procedure: COLOSTOMY;  Surgeon: Georganna Skeans, MD;  Location: [facility];  Service: General;  Laterality: N/A;   CYSTOSCOPY W/ URETERAL STENT PLACEMENT N/A 07/15/2018   Procedure: RETROGRADE URETHROGRAM;  Surgeon: Franchot Gallo, MD;  Location:  [facility];  Service: Urology;  Laterality: N/A;   CYSTOSCOPY WITH LITHOLAPAXY N/A 05/06/2019   Procedure: CYSTOSCOPY BASKET BLADDER STONE EXTRACTION;  Surgeon: Cleon Gustin, MD;  Location: [facility];  Service: Urology;  Laterality: N/A;  30 MINS   CYSTOSTOMY N/A 05/06/2019   Procedure: REPLACEMENT OF SUPRAPUBIC CATHETER;  Surgeon: Cleon Gustin, MD;  Location[facility];  Service: Urology;  Laterality: N/A;   DEBRIDEMENT AND CLOSURE WOUND Left 03/04/2019   Procedure: Excision of hip wound with placement of Acell;  Surgeon: [name], DO;  Location: [place];  Service: Plastics;  Laterality: Left;   ESOPHAGOGASTRODUODENOSCOPY N/A 08/14/2018   Procedure: ESOPHAGOGASTRODUODENOSCOPY (EGD);  Surgeon: Georganna Skeans, MD;  Location: [place];  Service: General;  Laterality: N/A;  bedside   FACIAL RECONSTRUCTION SURGERY     X 2--once as a teenager and second time in his 77's   HARDWARE REMOVAL Left 03/04/2019   Procedure: Left Hip Hardware Removal;  Surgeon: Shona Needles, MD;  Location: [place];  Service: Orthopedics;  Laterality: Left;   HIP PINNING,CANNULATED Left 07/12/2018   Procedure: CANNULATED HIP PINNING;  Surgeon: Shona Needles, MD;  Location: [place];  Service: Orthopedics;  Laterality: Left;   HIP SURGERY     HOLMIUM LASER APPLICATION Right [id number]   Procedure:  HOLMIUM LASER APPLICATION;  Surgeon: Cleon Gustin, MD;  Location: [facility];  Service: Urology;  Laterality: Right;   I & D EXTREMITY Left 07/25/2018   Procedure: Debridement of buttock, scrotum and left leg, placement of acell and vac;  Surgeon: [name], DO;  Location: [place];  Service: Plastics;  Laterality: Left;   I & D EXTREMITY N/A 08/06/2018   Procedure: Debridement of buttock, scrotum and left leg;  Surgeon: [name], DO;  Location: [place];  Service: Plastics;  Laterality: N/A;   I & D EXTREMITY N/A 08/13/2018   Procedure: Debridement of buttock, scrotum and left leg, placement of acell and vac;  Surgeon: [name], DO;  Location: [place];  Service: Plastics;  Laterality: N/A;  90 min, please   INCISION AND DRAINAGE HIP Left 09/18/2019   Procedure: IRRIGATION AND DEBRIDEMENT HIP/ PELVIS WITH WOUND VAC PLACEMENT;  Surgeon: Shona Needles, MD;  Location: [place];  Service: Orthopedics;  Laterality: Left;   INCISION AND DRAINAGE OF WOUND N/A 07/18/2018   Procedure: Debridement of left leg, buttocks and scrotal wound with placement of acell and Flexiseal;  Surgeon: [name], DO;  Location: [place];  Service: Plastics;  Laterality: N/A;   INCISION AND DRAINAGE OF WOUND Left 08/29/2018   Procedure: Debridement of buttock, scrotum and left leg, placement of acell and vac;  Surgeon: [name], DO;  Location: [place];  Service: Plastics;  Laterality: Left;  75 min, please   INCISION AND DRAINAGE OF WOUND Bilateral 10/23/2018   Procedure: DEBRIDEMENT OF BUTTOCK,SCROTUM, AND LEG WOUNDS WITH PLACEMENT OF ACELL- BILATERAL 90 MIN;  Surgeon: [name], DO;  Location: [place];  Service: Plastics;  Laterality: Bilateral;   IR ANGIOGRAM PELVIS SELECTIVE OR SUPRASELECTIVE  07/10/2018   IR ANGIOGRAM PELVIS SELECTIVE OR SUPRASELECTIVE  07/10/2018   IR ANGIOGRAM SELECTIVE EACH ADDITIONAL VESSEL  07/10/2018   IR EMBO ART  VEN HEMORR LYMPH  EXTRAV  INC GUIDE ROADMAPPING  07/10/2018   IR GASTROSTOMY TUBE MOD SED  01/21/2021   IR NEPHROSTOMY PLACEMENT LEFT  04/05/2019   IR NEPHROSTOMY PLACEMENT RIGHT  05/31/2019   IR US GUIDE BX ASP/DRAIN  07/10/2018   IR US GUIDE VASC ACCESS RIGHT  07/10/2018   IR VENO/EXT/UNI LEFT  07/10/2018   IRRIGATION AND DEBRIDEMENT OF WOUND WITH SPLIT THICKNESS SKIN GRAFT Left 09/19/2018   Procedure: Debridement of gluteal wound with placement of acell to left leg.;  Surgeon: [name], DO;  Location: [place];  Service: Plastics;  Laterality: Left;  2.5 hours, please   LAPAROTOMY N/A 07/12/2018   Procedure: EXPLORATORY LAPAROTOMY;  Surgeon: Georganna Skeans, MD;  Location: [place];  Service: General;  Laterality: N/A;   LAPAROTOMY N/A 07/15/2018   Procedure: WOUND EXPLORATION; CLOSURE OF ABDOMEN;  Surgeon: Georganna Skeans, MD;  Location: [place];  Service: General;  Laterality: N/A;   LAPAROTOMY  07/10/2018   Procedure: Exploratory Laparotomy;  Surgeon: Clovis Riley, MD;  Location: [place];  Service: General;;   MASS EXCISION Left 09/18/2019   Procedure: EXCISION UPPER LEFT INNER THIGH WOUND;  Surgeon: [name], DO;  Location: [place];  Service: Plastics;  Laterality: Left;   NEPHROLITHOTOMY Right 07/15/2019   Procedure: NEPHROLITHOTOMY PERCUTANEOUS;  Surgeon: Cleon Gustin, MD;  Location: [facility];  Service: Urology;  Laterality: Right;  90 MINS   PEG PLACEMENT N/A 08/14/2018   Procedure: PERCUTANEOUS ENDOSCOPIC GASTROSTOMY (PEG) PLACEMENT;  Surgeon: Georganna Skeans, MD;  Location: [place];  Service: General;  Laterality: N/A;   PERCUTANEOUS TRACHEOSTOMY N/A 08/02/2018   Procedure: PERCUTANEOUS TRACHEOSTOMY;  Surgeon: Georganna Skeans, MD;  Location: [place];  Service: General;  Laterality: N/A;   RADIOLOGY WITH ANESTHESIA N/A 07/10/2018   Procedure: IR WITH ANESTHESIA;  Surgeon: Sandi Mariscal, MD;  Location: [place];  Service: Radiology;  Laterality: N/A;   RADIOLOGY WITH ANESTHESIA  Right 07/10/2018   Procedure: Ir With Anesthesia;  Surgeon: Sandi Mariscal, MD;  Location: [place];  Service: Radiology;  Laterality: Right;   SCROTAL EXPLORATION N/A 07/15/2018   Procedure: SCROTUM DEBRIDEMENT;  Surgeon: Franchot Gallo, MD;  Location: [place];  Service: Urology;  Laterality: N/A;   SHOULDER SURGERY     SKIN SPLIT GRAFT Right 09/19/2018   Procedure: Skin Graft Split Thickness;  Surgeon: [name], DO;  Location: [place];  Service: Plastics;  Laterality: Right;   SKIN SPLIT GRAFT N/A 10/03/2018   Procedure: Split thickness skin graft to gluteal area with acell placement;  Surgeon: [name], DO;  Location: [place];  Service: Plastics;  Laterality: N/A;  3 hours, please   VACUUM ASSISTED CLOSURE CHANGE N/A 07/12/2018   Procedure: ABDOMINAL VACUUM ASSISTED CLOSURE CHANGE and abdominal washout;  Surgeon: Georganna Skeans, MD;  Location: [place];  Service: General;  Laterality: N/A;   WOUND DEBRIDEMENT Left 07/23/2018   Procedure: DEBRIDEMENT LEFT BUTTOCK  WOUND;  Surgeon: Georganna Skeans, MD;  Location: [place];  Service: General;  Laterality: Left;   WOUND EXPLORATION Left 07/10/2018   Procedure: WOUND EXPLORATION LEFT GROIN;  Surgeon: Clovis Riley, MD;  Location: [place];  Service: General;  Laterality: Left;       Review of Systems  Constitutional:  Negative for chills, diaphoresis, fatigue and fever.  Respiratory:  Negative for cough, chest tightness, shortness of breath and wheezing.   Cardiovascular:  Negative for chest pain.  Gastrointestinal:  Positive for nausea. Negative for abdominal pain, diarrhea and vomiting.      Objective:    BP 132/86   Pulse 92   Temp 98.4 F (36.9 C) (Temporal)   SpO2 95%  Nursing note and vital signs reviewed.  Physical Exam Constitutional:      General: He is not in acute distress.    Appearance: He is well-developed.  Cardiovascular:     Rate and Rhythm: Normal rate and regular rhythm.     Heart sounds: Normal heart  sounds.  Pulmonary:     Effort: Pulmonary effort is normal.     Breath sounds: Normal breath sounds.  Skin:    General: Skin is warm and dry.  Neurological:     Mental Status: He is alert and oriented to person, place, and time.  Psychiatric:        Behavior: Behavior normal.        Thought Content: Thought content normal.        Judgment: Judgment normal.         03/31/2022   12:36 PM 01/12/2022   11:17 AM 11/17/2021   11:30 AM 10/06/2021   11:28 AM 08/13/2021   11:33 AM  Depression screen PHQ 2/9  Decreased Interest  0 1 1 1   Down, Depressed, Hopeless 3 1 1 1 1   PHQ - 2 Score 3 1 2 2 2   Altered sleeping 3      Tired, decreased energy 2      Change in appetite 3      Feeling bad or failure about yourself  3      Trouble concentrating 3      Moving slowly or fidgety/restless 2      Suicidal thoughts 0      PHQ-9 Score 19      Difficult doing work/chores Very difficult           Assessment & Plan:    Patient Active Problem List   Diagnosis Date Noted   Memory deficits 01/12/2022   Hypnagogic jerks 09/22/2021   Neuromuscular weakness ([place]) 06/24/2021   Depression, major, single episode, moderate (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   Chronic osteomyelitis of hip ([place]) 11/22/2020   Osteomyelitis ([place]) 11/22/2020   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/22/2020   Respiratory failure with hypoxia ([place]) 07/02/2020   Dysphagia 07/02/2020   Therapeutic opioid induced constipation 04/11/2020   Status post peripherally inserted central catheter (PICC) central line placement 03/17/2020   Skin-picking disorder 03/12/2020   Recurrent pain of right knee 11/08/2019   Elevated blood pressure reading 11/05/2019   Myofascial pain dysfunction syndrome [id number]   Complicated urinary tract infection 05/30/2019   Wound infection, posttraumatic 02/28/2019   Hydronephrosis concurrent with and due to calculi of kidney and ureter 02/28/2019   GERD (gastroesophageal  reflux disease) 02/28/2019   Suprapubic catheter ([place]) 02/28/2019   Anxiety 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 01/29/2019   Chronic pain due to trauma 01/29/2019   Neuropathic pain 01/29/2019   Colostomy status ([place]) 01/14/2019   Insomnia 01/14/2019   Current use of proton pump inhibitor 01/14/2019   Wound of buttock 12/28/2018   Anxiety and depression 12/17/2018   Debility 11/23/2018   Chronic pain syndrome    Multiple traumatic injuries    Pressure injury of skin 08/13/2018   Urethral injury 07/13/2018     Problem List Items Addressed This Visit       Musculoskeletal and Integument   Chronic osteomyelitis of hip ([place]) - Primary    Glenda has been doing well and appears to have continued suppression with good adherence and tolerance to Cirpofloxacin. Wound remains unchanged. Reviewed lab work and discussed plan of care.  Continue wound care per Dr. Alveta Heimlich. Continue current dose of ciprofloxacin and oral vancomcyin. Plan for follow up in 6 months or sooner if needed.       Relevant Medications   ciprofloxacin (CIPRO) 500 MG tablet   vancomycin (VANCOCIN) 125 MG capsule     I have changed Sylvain B. Almendarez's ciprofloxacin and vancomycin. I am also having him maintain his multivitamin with minerals, melatonin, Blood Pressure Cuff, oxybutynin, naloxone, dextromethorphan-guaiFENesin, Probiotic Colon Support, senna-docusate, famotidine, lidocaine, linaclotide, promethazine, gabapentin, Anoro Ellipta, methocarbamol, LORazepam, PARoxetine, OLANZapine, OLANZapine, albuterol, morphine, and fentaNYL.   Meds ordered this encounter  Medications   ciprofloxacin (CIPRO) 500 MG tablet    Sig: Take 1 tablet (500 mg total) by mouth 2 (two) times daily.    Dispense:  60 tablet    Refill:  11    Order Specific Question:   Supervising Provider    Answer:   Baxter Flattery, CYNTHIA [[id number]]   vancomycin (VANCOCIN) 125 MG capsule    Sig: Take 1 capsule (125 mg total) by mouth in the morning and  at bedtime.    Dispense:  60 capsule    Refill:  11    Order Specific Question:   Supervising Provider    Answer:   Carlyle Basques [[id number]]     Follow-up: Return in about 6 months (around 10/20/2022), or if symptoms worsen or fail to improve.   Terri Piedra, MSN, FNP-C Nurse Practitioner [facility] for Infectious Disease [place] number: [phone]

## 2022-04-21 NOTE — Assessment & Plan Note (Signed)
Christopher [name] has been doing well and appears to have continued suppression with good adherence and tolerance to Cirpofloxacin. Wound remains unchanged. Reviewed lab work and discussed plan of care. Continue wound care per Dr. Luz Lex. Continue current dose of ciprofloxacin and oral vancomcyin. Plan for follow up in 6 months or sooner if needed.

## 2022-05-12 ENCOUNTER — Emergency Department (HOSPITAL_COMMUNITY)
Admission: EM | Admit: 2022-05-12 | Discharge: 2022-05-12 | Disposition: A | Discharge status: Home / Self Care | Payer: PRIVATE HEALTH INSURANCE | Attending: Emergency Medicine | Admitting: Emergency Medicine

## 2022-05-12 ENCOUNTER — Encounter (HOSPITAL_COMMUNITY): Payer: Self-pay

## 2022-05-12 ENCOUNTER — Other Ambulatory Visit: Payer: Self-pay

## 2022-05-12 DIAGNOSIS — T83510A Infection and inflammatory reaction due to cystostomy catheter, initial encounter: Secondary | ICD-10-CM | POA: Insufficient documentation

## 2022-05-12 DIAGNOSIS — N390 Urinary tract infection, site not specified: Secondary | ICD-10-CM | POA: Insufficient documentation

## 2022-05-12 LAB — COMPREHENSIVE METABOLIC PANEL
ALT: 48 U/L — ABNORMAL HIGH (ref 0–44)
AST: 47 U/L — ABNORMAL HIGH (ref 15–41)
Albumin: 3.6 g/dL (ref 3.5–5.0)
Alkaline Phosphatase: 100 U/L (ref 38–126)
Anion gap: 6 (ref 5–15)
BUN: 12 mg/dL (ref 6–20)
CO2: 27 mmol/L (ref 22–32)
Calcium: 9.8 mg/dL (ref 8.9–10.3)
Chloride: 105 mmol/L (ref 98–111)
Creatinine, Ser: 0.74 mg/dL (ref 0.61–1.24)
GFR, Estimated: >60 mL/min (ref >60)
Glucose, Bld: 100 mg/dL — ABNORMAL HIGH (ref 70–99)
Potassium: 4.4 mmol/L (ref 3.5–5.1)
Sodium: 138 mmol/L (ref 135–145)
Total Bilirubin: 0.6 mg/dL (ref 0.3–1.2)
Total Protein: 7.4 g/dL (ref 6.5–8.1)

## 2022-05-12 LAB — CBC
HCT: 41.1 % (ref 39.0–52.0)
Hemoglobin: 12.3 g/dL — ABNORMAL LOW (ref 13.0–17.0)
MCH: 30.6 pg (ref 26.0–34.0)
MCHC: 29.9 g/dL — ABNORMAL LOW (ref 30.0–36.0)
MCV: 102.2 fL — ABNORMAL HIGH (ref 80.0–100.0)
Platelets: 190 10*3/uL (ref 150–400)
RBC: 4.02 MIL/uL — ABNORMAL LOW (ref 4.22–5.81)
RDW: 12.5 % (ref 11.5–15.5)
WBC: 4.7 10*3/uL (ref 4.0–10.5)
nRBC: 0 % (ref 0.0–0.2)

## 2022-05-12 LAB — URINALYSIS, ROUTINE W REFLEX MICROSCOPIC
Bilirubin Urine: NEGATIVE
Glucose, UA: NEGATIVE mg/dL
Ketones, ur: NEGATIVE mg/dL
Nitrite: POSITIVE — AB
Protein, ur: 30 mg/dL — AB
RBC / HPF: >50 RBC/hpf — ABNORMAL HIGH (ref 0–5)
Specific Gravity, Urine: 1.013 (ref 1.005–1.030)
pH: 6 (ref 5.0–8.0)

## 2022-05-12 LAB — LIPASE, BLOOD: Lipase: 24 U/L (ref 11–51)

## 2022-05-12 MED ORDER — NITROFURANTOIN MONOHYD MACRO 100 MG PO CAPS
100.0000 mg | ORAL_CAPSULE | Freq: Once | ORAL | Status: AC
  Administered 2022-05-12: 100 mg via ORAL
  Filled 2022-05-12: qty 1

## 2022-05-12 MED ORDER — NITROFURANTOIN MONOHYD MACRO 100 MG PO CAPS: 100.0000 mg | ORAL_CAPSULE | Freq: Two times a day (BID) | ORAL | 0 refills | Status: AC

## 2022-05-12 MED ORDER — OXYCODONE-ACETAMINOPHEN 5-325 MG PO TABS
1.0000 | ORAL_TABLET | Freq: Once | ORAL | Status: AC
  Administered 2022-05-12: 1 via ORAL
  Filled 2022-05-12: qty 1

## 2022-05-12 NOTE — ED Triage Notes (Addendum)
Per EMS- Patient is from home. Patient is on home O2 4L/min via [place]. Patient c/o lower abdominal pain since last night. Patient has a SP cath and told his wife that it was clogged. Patient's wife changed the tubing this AM and the urine is flowing freely and was clear per EMS. Patient is very anxious.  Patient can stand and pivot only.   Patient added in triage that he is out of Oxycodone this AM and states, "I have to see a doctor before I can get anymore."

## 2022-05-12 NOTE — Discharge Instructions (Addendum)
Take antibiotics as prescribed and complete the full course.  Recheck with your primary care provider.

## 2022-05-12 NOTE — ED Provider Notes (Signed)
[facility]-EMERGENCY DEPT Provider Note   CSN: [id number] Arrival date & time: 05/12/22  0946     History  Chief Complaint  Patient presents with   Abdominal Pain    Christopher [name] is a 56 y.o. male.  Pt complains of suprapubic pain, has indwelling suprapubic cathter, onset yesterday. Changed catheter today, has good flow, concern for UTI, recurrent but has been awhile since last infection.  Denies vomiting, fever, hematuria  Took oxycodone at 8 AM today.  Has fentanyl and patch in place to left upper arm.        Home Medications Prior to Admission medications   Medication Sig Start Date End Date Taking? Authorizing Provider  nitrofurantoin, macrocrystal-monohydrate, (MACROBID) 100 MG capsule Take 1 capsule (100 mg total) by mouth 2 (two) times daily for 7 days. 05/12/22 05/19/22 Yes Jeannie Fend, PA-C  albuterol (VENTOLIN HFA) 108 (90 Base) MCG/ACT inhaler USE 2 INHALATIONS EVERY 6 HOURS AS NEEDED FOR WHEEZING OR SHORTNESS OF BREATH 03/31/22   Ardith Dark, MD  Blood Pressure Monitoring (BLOOD PRESSURE CUFF) MISC Use daily as needed to check blood pressure. 11/05/19   Ardith Dark, MD  ciprofloxacin (CIPRO) 500 MG tablet Take 1 tablet (500 mg total) by mouth 2 (two) times daily. 04/21/22   Veryl Speak, FNP  dextromethorphan-guaiFENesin (MUCINEX DM) 30-600 MG 12hr tablet Take 2 tablets by mouth 2 (two) times daily.    [provider]  famotidine (PEPCID) 40 MG tablet TAKE 1 TABLET BY MOUTH TWICE A DAY Patient taking differently: Take 40 mg by mouth 2 (two) times daily. 01/26/21   Lovorn, Aundra Millet, MD  fentaNYL (DURAGESIC) 75 MCG/HR Place 1 patch onto the skin every other day. 04/18/22   Lovorn, Aundra Millet, MD  gabapentin (NEURONTIN) 300 MG capsule TAKE 1 CAPSULE BY MOUTH THREE TIMES A DAY 11/23/21   Lovorn, Megan, MD  lidocaine (LIDODERM) 5 % APPLY 3 PATCHES TO SKIN EVERY 12 HOURS AND DISCARD WITHING 12 HOURS OR AS DIRECTED 08/06/21   Lovorn,  Aundra Millet, MD  linaclotide [facility]) 72 MCG capsule Take 2 capsules (145 mcg total) by mouth daily before breakfast. 09/22/21   Ardith Dark, MD  LORazepam (ATIVAN) 0.5 MG tablet Take 0.5 mg by mouth 3 (three) times daily. 03/25/22   [provider]  melatonin ([facility] MELATONIN) 3 MG TABS tablet Take 1 tablet (3 mg total) by mouth at bedtime. 11/01/19   Matcha, Darrold Junker, MD  methocarbamol (ROBAXIN) 500 MG tablet Take 1 tablet (500 mg total) by mouth every 6 (six) hours as needed for muscle spasms. 12/08/21   Lovorn, Aundra Millet, MD  morphine (MSIR) 15 MG tablet Take 1 tablet (15 mg total) by mouth every 3 (three) hours as needed for severe pain. 04/18/22   Lovorn, Aundra Millet, MD  Multiple Vitamin (MULTIVITAMIN WITH MINERALS) TABS tablet Take 1 tablet by mouth daily. 03/13/19   Esperanza Sheets, MD  naloxone [facility]) nasal spray 4 mg/0.1 mL To use if pt develops unconsciousness or confusion that family thinks is related to opioids. 12/30/19   Lovorn, Aundra Millet, MD  OLANZapine (ZYPREXA) 10 MG tablet Take 10 mg by mouth at bedtime. 02/02/22   [provider]  OLANZapine (ZYPREXA) 2.5 MG tablet Take 2.5 mg by mouth in the morning and at bedtime.    [provider]  oxybutynin (DITROPAN) 5 MG tablet Take 5 mg by mouth See admin instructions. Take 5 mg by mouth three times a day and an additional 5 mg once  daily as needed for urinary urgency 11/13/19   [provider]  PARoxetine (PAXIL) 40 MG tablet Take 40 mg by mouth daily. 03/29/22   [provider]  Probiotic Product (PROBIOTIC COLON SUPPORT) CAPS Take 1 capsule by mouth daily.    [provider]  promethazine (PHENERGAN) 12.5 MG tablet TAKE 1 TABLET (12.5 MG TOTAL) BY MOUTH EVERY 6 (SIX) HOURS AS NEEDED FOR NAUSEA, VOMITING OR REFRACTORY NAUSEA / VOMITING. 10/13/21   Lovorn, Aundra Millet, MD  senna-docusate (SENOKOT-S) 8.6-50 MG tablet Take 2 tablets by mouth 3 (three) times daily. 04/13/20   [provider]   umeclidinium-vilanterol (ANORO ELLIPTA) 62.5-25 MCG/ACT AEPB Inhale 1 puff into the lungs daily. 11/24/21   Charlott Holler, MD  vancomycin (VANCOCIN) 125 MG capsule Take 1 capsule (125 mg total) by mouth in the morning and at bedtime. 04/21/22   Veryl Speak, FNP      Allergies    Methadone    Review of Systems   Review of Systems Negative except as per HPI Physical Exam Updated Vital Signs BP (!) 152/133   Pulse 86   Temp 98 F (36.7 C) (Oral)   Resp 20   Ht 6\' 3"  (1.905 m)   Wt 81.6 kg   SpO2 96%   BMI 22.50 kg/m  Physical Exam Vitals and nursing note reviewed.  Constitutional:      General: He is not in acute distress.    Appearance: He is well-developed. He is not diaphoretic.  HENT:     Head: Normocephalic and atraumatic.  Cardiovascular:     Rate and Rhythm: Normal rate and regular rhythm.     Heart sounds: Normal heart sounds.  Pulmonary:     Effort: Pulmonary effort is normal.     Breath sounds: Normal breath sounds.  Abdominal:     Tenderness: There is no abdominal tenderness.     Comments: Suprapubic catheter in place with clear yellow urine in bag  Skin:    General: Skin is warm and dry.     Findings: No erythema or rash.  Neurological:     Mental Status: He is alert and oriented to person, place, and time.  Psychiatric:        Behavior: Behavior normal.     ED Results / Procedures / Treatments   Labs (all labs ordered are listed, but only abnormal results are displayed) Labs Reviewed  COMPREHENSIVE METABOLIC PANEL - Abnormal; Notable for the following components:      Result Value   Glucose, Bld 100 (*)    AST 47 (*)    ALT 48 (*)    All other components within normal limits  CBC - Abnormal; Notable for the following components:   RBC 4.02 (*)    Hemoglobin 12.3 (*)    MCV 102.2 (*)    MCHC 29.9 (*)    All other components within normal limits  URINALYSIS, ROUTINE W REFLEX MICROSCOPIC - Abnormal; Notable for the following components:    Hgb urine dipstick LARGE (*)    Protein, ur 30 (*)    Nitrite POSITIVE (*)    Leukocytes,Ua MODERATE (*)    RBC / HPF >50 (*)    Bacteria, UA MANY (*)    All other components within normal limits  URINE CULTURE  LIPASE, BLOOD    EKG None  Radiology No results found.  Procedures Procedures    Medications Ordered in ED Medications  nitrofurantoin (macrocrystal-monohydrate) (MACROBID) capsule 100 mg (100 mg Oral  Given 05/12/22 1405)  oxyCODONE-acetaminophen (PERCOCET/ROXICET) 5-325 MG per tablet 1 tablet (1 tablet Oral Given 05/12/22 1405)    ED Course/ Medical Decision Making/ A&P                           Medical Decision Making  This patient presents to the ED for concern of abdominal pain with indwelling catheter, this involves an extensive number of treatment options, and is a complaint that carries with it a high risk of complications and morbidity.  The differential diagnosis includes UTI, displaced/malfunctioning catheter   Co morbidities that complicate the patient evaluation  Osteomyelitis, hypertension, wheelchair-bound, pneumonia, DVT   Additional history obtained:  External records from outside source obtained and reviewed including prior culture report showing >100K e coli, with macrobid sensitivity    Lab Tests:  I Ordered, and personally interpreted labs.  The pertinent results include: Urinalysis positive for hemoglobin, nitrites, leukocytes and bacteria CBC with normal WBC.  Lipase normal.  CMP with mildly elevated AST and ALT at 47 and 48, not significantly changed from prior on file.   Consultations Obtained:  I requested consultation with the ER attending, Dr. Doran Durand,  and discussed lab and imaging findings as well as pertinent plan - they recommend: agree with plan to treat with macrobid after review of prior culture from 03/06/22    Problem List / ED Course / Critical interventions / Medication management  55 year old male with  indwelling Foley catheter presents with concern for urinary tract infection.  Reports history of same.  States has had some suprapubic discomfort similar to prior UTIs, no fevers, no vomiting.  Patient changed his catheter this morning, symptoms persisted which prompted him to come the emergency room today.  He has clear yellow urine in his Foley, abdomen soft nontender the patient is nontoxic in appearance and afebrile.  CBC and CMP without significant changes from prior.  Urinalysis does suggest infection.  Compared to prior cultures on file, history of E. coli infection which is sensitive to nitrofurantoin, does have several resistant antibiotics.  Discussed with ED attending, plan is to treat with Macrobid.  Sent for culture.  Recommend follow-up with PCP for recheck. I ordered medication including Macrobid, Percocet for pain, UTI Reevaluation of the patient after these medicines showed that the patient improved I have reviewed the patients home medicines and have made adjustments as needed   Social Determinants of Health:  Wheelchair-bound, indwelling Foley catheter, has PCP   Test / Admission - Considered:  After comprehensive ER evaluation, felt safe for discharge on antibiotics to recheck with primary care.         Final Clinical Impression(s) / ED Diagnoses Final diagnoses:  Urinary tract infection associated with cystostomy catheter, initial encounter [facility])    Rx / DC Orders ED Discharge Orders          Ordered    nitrofurantoin, macrocrystal-monohydrate, (MACROBID) 100 MG capsule  2 times daily        05/12/22 1401              Alden Hipp 05/12/22 1449    Glyn Ade, MD 05/21/22 [phone]

## 2022-05-12 NOTE — ED Notes (Signed)
[facility] called and stated patient will be picked up within the hour, patient made aware.

## 2022-05-12 NOTE — ED Provider Triage Note (Addendum)
Emergency Medicine Provider Triage Evaluation Note  Christopher [name] , a 56 y.o. male  was evaluated in triage.  Pt complains of suprapubic pain, has indwelling suprapubic cathter, onset yesterday. Changed catheter today, has good flow, concern for UTI, recurrent but has been awhile since last infection.  Denies vomiting, fever, hematuria  Took oxycodone at 8 AM today.  Has fentanyl and patch in place to left upper arm. Review of Systems  Positive: As above Negative: As above  Physical Exam  BP (!) 178/109 (BP Location: Left Arm)   Pulse (!) 114   Temp 98 F (36.7 C) (Oral)   Resp 20   Ht 6\' 3"  (1.905 m)   Wt 81.6 kg   SpO2 94%   BMI 22.50 kg/m  Gen:   Awake, no distress   Resp:  Normal effort  MSK:   Moves extremities without difficulty  Other:    Medical Decision Making  Medically screening exam initiated at 10:05 AM.  Appropriate orders placed.  was informed that the remainder of the evaluation will be completed by another provider, this initial triage assessment does not replace that evaluation, and the importance of remaining in the ED until their evaluation is complete.     [name], PA-C 05/12/22 1006    05/14/22, PA-C 05/12/22 1021

## 2022-05-14 LAB — URINE CULTURE: Culture: 30000 — AB

## 2022-05-15 ENCOUNTER — Telehealth (HOSPITAL_BASED_OUTPATIENT_CLINIC_OR_DEPARTMENT_OTHER): Payer: Self-pay | Admitting: *Deleted

## 2022-05-15 ENCOUNTER — Other Ambulatory Visit: Payer: Self-pay | Admitting: Family Medicine

## 2022-05-15 NOTE — Telephone Encounter (Signed)
Post ED Visit - Positive Culture Follow-up  Culture report reviewed by antimicrobial stewardship pharmacist: [facility] []  , Pharm.D. []  Enzo Bi, Pharm.D., BCPS AQ-ID []  , Pharm.D., BCPS []  Celedonio Miyamoto, Pharm.D., BCPS []  [place], Garvin Fila.D., BCPS, AAHIVP []  , Pharm.D., BCPS, AAHIVP []  Georgina Pillion, PharmD, BCPS []  , PharmD, BCPS []  [place], PharmD, BCPS []  [address], PharmD []  , PharmD, BCPS []  Estella Husk, PharmD  Pharmacy Team []  Lysle Pearl, PharmD []  , PharmD []  Phillips Climes, PharmD []  , Rph []  Agapito Games) , PharmD []  Verlan Friends, PharmD []  , PharmD []  Mervyn Gay, PharmD []  , PharmD []  Vinnie Level, PharmD []  [facility], PharmD []  , PharmD [x]  Len Childs, PharmD   Positive urine culture Treated with Nitrofurantoin , organism sensitive to the same and no further patient follow-up is required at this time.  05/15/2022, 8:56 AM

## 2022-05-16 ENCOUNTER — Telehealth: Payer: Self-pay | Admitting: Registered Nurse

## 2022-05-16 ENCOUNTER — Encounter: Payer: No Typology Code available for payment source | Admitting: Registered Nurse

## 2022-05-16 MED ORDER — MORPHINE SULFATE 15 MG PO TABS: 15.0000 mg | ORAL_TABLET | ORAL | 0 refills | Status: DC | PRN

## 2022-05-16 MED ORDER — FENTANYL 75 MCG/HR TD PT72: 1.0000 | MEDICATED_PATCH | TRANSDERMAL | 0 refills | Status: DC

## 2022-05-16 NOTE — Telephone Encounter (Signed)
Patient needing refills on Fentanyl patch 75 mg and morphine IR 15mg 

## 2022-05-16 NOTE — Telephone Encounter (Signed)
PMP was reviewed. Dr Berline Chough note was reviews.  My Chart message sent to Mr. Vanecek regarding the above.

## 2022-05-23 ENCOUNTER — Other Ambulatory Visit: Payer: Self-pay | Admitting: Physical Medicine and Rehabilitation

## 2022-06-01 ENCOUNTER — Ambulatory Visit: Payer: PRIVATE HEALTH INSURANCE | Admitting: Physical Medicine and Rehabilitation

## 2022-06-07 ENCOUNTER — Other Ambulatory Visit: Payer: Self-pay | Admitting: Family Medicine

## 2022-06-07 ENCOUNTER — Other Ambulatory Visit: Payer: Self-pay | Admitting: Physical Medicine and Rehabilitation

## 2022-06-16 ENCOUNTER — Ambulatory Visit: Payer: No Typology Code available for payment source | Admitting: Registered Nurse

## 2022-06-17 ENCOUNTER — Encounter
Payer: No Typology Code available for payment source | Attending: Physical Medicine and Rehabilitation | Admitting: Registered Nurse

## 2022-06-17 ENCOUNTER — Encounter: Payer: Self-pay | Admitting: Registered Nurse

## 2022-06-17 VITALS — BP 147/93 | HR 82 | Ht 75.0 in

## 2022-06-17 DIAGNOSIS — Z9359 Other cystostomy status: Secondary | ICD-10-CM | POA: Diagnosis present

## 2022-06-17 DIAGNOSIS — F064 Anxiety 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: Secondary | ICD-10-CM | POA: Insufficient documentation

## 2022-06-17 DIAGNOSIS — Z5181 Encounter for therapeutic drug level monitoring: Secondary | ICD-10-CM

## 2022-06-17 DIAGNOSIS — Z79891 Long term (current) use of opiate analgesic: Secondary | ICD-10-CM | POA: Diagnosis not present

## 2022-06-17 DIAGNOSIS — Z993 Dependence on wheelchair: Secondary | ICD-10-CM | POA: Diagnosis present

## 2022-06-17 DIAGNOSIS — G709 Myoneural disorder, unspecified: Secondary | ICD-10-CM | POA: Diagnosis present

## 2022-06-17 DIAGNOSIS — M8668 Other chronic osteomyelitis, other site: Secondary | ICD-10-CM | POA: Diagnosis present

## 2022-06-17 DIAGNOSIS — G894 Chronic pain syndrome: Secondary | ICD-10-CM | POA: Diagnosis not present

## 2022-06-17 DIAGNOSIS — Z933 Colostomy status: Secondary | ICD-10-CM | POA: Insufficient documentation

## 2022-06-17 DIAGNOSIS — M8669 Other chronic osteomyelitis, multiple sites: Secondary | ICD-10-CM | POA: Diagnosis not present

## 2022-06-17 DIAGNOSIS — G8921 Chronic pain due to trauma: Secondary | ICD-10-CM

## 2022-06-17 DIAGNOSIS — T07XXXA Unspecified multiple injuries, initial encounter: Secondary | ICD-10-CM | POA: Diagnosis present

## 2022-06-17 MED ORDER — FENTANYL 75 MCG/HR TD PT72: 1.0000 | MEDICATED_PATCH | TRANSDERMAL | 0 refills | Status: DC

## 2022-06-17 MED ORDER — MORPHINE SULFATE 15 MG PO TABS: 15.0000 mg | ORAL_TABLET | ORAL | 0 refills | Status: DC | PRN

## 2022-06-17 NOTE — Progress Notes (Signed)
Subjective:    Patient ID: Christopher [name], male    DOB: 09/12/1965, 57 y.o.   MRN: [id number]  HPI: Christopher [name] is a 57 y.o. male who returns for follow up appointment for chronic pain and medication refill. states *** pain is located in  ***. rates pain ***. current exercise regime is walking and performing stretching exercises.  [name] Morphine equivalent is *** MME.      Pain Inventory Average Pain 8 Pain Right Now 8 My pain is sharp, burning, dull, stabbing, tingling, and aching  LOCATION OF PAIN  back, buttocks, hip, thigh, knee  BOWEL  History of colostomy Yes    BLADDER Suprapubic In and out cath, frequency monthly,prn   Mobility Do you have any goals in this area?  no  Function I need assistance with the following:  feeding, dressing, bathing, toileting, meal prep, household duties, and shopping Do you have any goals in this area?  no  Neuro/Psych bladder control problems weakness numbness tremor tingling trouble walking spasms dizziness confusion depression anxiety  Prior Studies Any changes since last visit?  no  Physicians involved in your care Any changes since last visit?  no   Family History  Problem Relation Age of Onset   Breast cancer Mother        with mets to the bones   Social History   Socioeconomic History   Marital status: Married    Spouse name: Not on file   Number of children: Not on file   Years of education: Not on file   Highest education level: Not on file  Occupational History   Occupation: Disable  Tobacco Use   Smoking status: Former    Packs/day: 1.00    Years: 20.00    Total pack years: 20.00    Types: Cigarettes    Quit date: 07/10/2018    Years since quitting: 3.9   Smokeless tobacco: Never  Vaping Use   Vaping Use: Never used  Substance and Sexual Activity   Alcohol use: Never   Drug use: Yes    Types: Oxycodone, Fentanyl    Comment: Fentanyl patch/oxycodone since 06/2018    Sexual activity: Yes  Other Topics Concern   Not on file  Social History Narrative   ** Merged History Encounter **       Social Determinants of Health   Financial Resource Strain: Low Risk  (03/01/2019)   Overall Financial Resource Strain (CARDIA)    Difficulty of Paying Living Expenses: Not very hard  Food Insecurity: Food Insecurity Present (03/01/2019)   Hunger Vital Sign    Worried About Running Out of Food in the Last Year: Sometimes true    Ran Out of Food in the Last Year: Sometimes true  Transportation Needs: No Transportation Needs (03/01/2019)   PRAPARE - Hydrologist (Medical): No    Lack of Transportation (Non-Medical): No  Physical Activity: Inactive (03/01/2019)   Exercise Vital Sign    Days of Exercise per Week: 0 days    Minutes of Exercise per Session: 0 min  Stress: Stress Concern Present (03/01/2019)   [place]    Feeling of Stress : Rather much  Social Connections: Not on file   Past Surgical History:  Procedure Laterality Date   APPLICATION OF A-CELL OF BACK N/A 08/06/2018   Procedure: Application Of A-Cell Of Back;  Surgeon: [name], DO;  Location: [place];  Service: Government social research officer;  Laterality: N/A;   APPLICATION OF A-CELL OF EXTREMITY Left 08/06/2018   Procedure: Application Of A-Cell Of Extremity;  Surgeon: Peggye Form, DO;  [facility];  Service: Plastics;  Laterality: Left;   APPLICATION OF A-CELL OF EXTREMITY Left 09/18/2019   Procedure: APPLICATION OF A-CELL OF EXTREMITY;  Surgeon: Peggye Form, DO;  [facility];  Service: Plastics;  Laterality: Left;   APPLICATION OF WOUND VAC  07/12/2018   Procedure: Application Of Wound Vac to the Left Thigh and Scrotum.;  Surgeon: Roby Lofts, MD;  [facility];  Service: Orthopedics;;   APPLICATION OF WOUND VAC  07/10/2018   Procedure: Application Of Wound Vac;  Surgeon: Berna Bue,  MD;  Location: [facility] OR;  Service: General;;   COLON SURGERY  2020   colostomy   COLOSTOMY N/A 07/23/2018   Procedure: COLOSTOMY;  Surgeon: Violeta Gelinas, MD;  Location: [facility] OR;  Service: General;  Laterality: N/A;   CYSTOSCOPY W/ URETERAL STENT PLACEMENT N/A 07/15/2018   Procedure: RETROGRADE URETHROGRAM;  Surgeon: Marcine Matar, MD;  Location: [facility] OR;  Service: Urology;  Laterality: N/A;   CYSTOSCOPY WITH LITHOLAPAXY N/A 05/06/2019   Procedure: CYSTOSCOPY BASKET BLADDER STONE EXTRACTION;  Surgeon: Malen Gauze, MD;  Location: [facility];  Service: Urology;  Laterality: N/A;  30 MINS   CYSTOSTOMY N/A 05/06/2019   Procedure: REPLACEMENT OF SUPRAPUBIC CATHETER;  Surgeon: Malen Gauze, MD;  Location: [facility];  Service: Urology;  Laterality: N/A;   DEBRIDEMENT AND CLOSURE WOUND Left 03/04/2019   Procedure: Excision of hip wound with placement of Acell;  Surgeon: Peggye Form, DO;  [facility];  Service: Plastics;  Laterality: Left;   ESOPHAGOGASTRODUODENOSCOPY N/A 08/14/2018   Procedure: ESOPHAGOGASTRODUODENOSCOPY (EGD);  Surgeon: Violeta Gelinas, MD;  Location: [facility] ENDOSCOPY;  Service: General;  Laterality: N/A;  bedside   FACIAL RECONSTRUCTION SURGERY     X 2--once as a teenager and second time in his 64's   HARDWARE REMOVAL Left 03/04/2019   Procedure: Left Hip Hardware Removal;  Surgeon: Roby Lofts, MD;  [facility];  Service: Orthopedics;  Laterality: Left;   HIP PINNING,CANNULATED Left 07/12/2018   Procedure: CANNULATED HIP PINNING;  Surgeon: Roby Lofts, MD;  [facility];  Service: Orthopedics;  Laterality: Left;   HIP SURGERY     HOLMIUM LASER APPLICATION Right 07/15/2019   Procedure: HOLMIUM LASER APPLICATION;  Surgeon: Malen Gauze, MD;  Location: [facility];  Service: Urology;  Laterality: Right;   I & D EXTREMITY Left 07/25/2018   Procedure: Debridement of buttock, scrotum and left leg,  placement of acell and vac;  Surgeon: Peggye Form, DO;  [facility];  Service: Plastics;  Laterality: Left;   I & D EXTREMITY N/A 08/06/2018   Procedure: Debridement of buttock, scrotum and left leg;  Surgeon: Peggye Form, DO;  [facility];  Service: Plastics;  Laterality: N/A;   I & D EXTREMITY N/A 08/13/2018   Procedure: Debridement of buttock, scrotum and left leg, placement of acell and vac;  Surgeon: Peggye Form, DO;  [facility];  Service: Plastics;  Laterality: N/A;  90 min, please   INCISION AND DRAINAGE HIP Left 09/18/2019   Procedure: IRRIGATION AND DEBRIDEMENT HIP/ PELVIS WITH WOUND VAC PLACEMENT;  Surgeon: Roby Lofts, MD;  [facility];  Service: Orthopedics;  Laterality: Left;   INCISION AND DRAINAGE OF WOUND N/A 07/18/2018  Procedure: Debridement of left leg, buttocks and scrotal wound with placement of acell and Flexiseal;  Surgeon: Peggye Form, DO;  [facility];  Service: Plastics;  Laterality: N/A;   INCISION AND DRAINAGE OF WOUND Left 08/29/2018   Procedure: Debridement of buttock, scrotum and left leg, placement of acell and vac;  Surgeon: Peggye Form, DO;  [facility];  Service: Plastics;  Laterality: Left;  75 min, please   INCISION AND DRAINAGE OF WOUND Bilateral 10/23/2018   Procedure: DEBRIDEMENT OF BUTTOCK,SCROTUM, AND LEG WOUNDS WITH PLACEMENT OF ACELL- BILATERAL 90 MIN;  Surgeon: Peggye Form, DO;  [facility];  Service: Plastics;  Laterality: Bilateral;   IR ANGIOGRAM PELVIS SELECTIVE OR SUPRASELECTIVE  07/10/2018   IR ANGIOGRAM PELVIS SELECTIVE OR SUPRASELECTIVE  07/10/2018   IR ANGIOGRAM SELECTIVE EACH ADDITIONAL VESSEL  07/10/2018   IR EMBO ART  VEN HEMORR LYMPH EXTRAV  INC GUIDE ROADMAPPING  07/10/2018   IR GASTROSTOMY TUBE MOD SED  01/21/2021   IR NEPHROSTOMY PLACEMENT LEFT  04/05/2019   IR NEPHROSTOMY PLACEMENT RIGHT  05/31/2019   IR US GUIDE BX ASP/DRAIN  07/10/2018   IR US GUIDE VASC ACCESS  RIGHT  07/10/2018   IR VENO/EXT/UNI LEFT  07/10/2018   IRRIGATION AND DEBRIDEMENT OF WOUND WITH SPLIT THICKNESS SKIN GRAFT Left 09/19/2018   Procedure: Debridement of gluteal wound with placement of acell to left leg.;  Surgeon: Peggye Form, DO;  [facility];  Service: Plastics;  Laterality: Left;  2.5 hours, please   LAPAROTOMY N/A 07/12/2018   Procedure: EXPLORATORY LAPAROTOMY;  Surgeon: Violeta Gelinas, MD;  Location: [facility] OR;  Service: General;  Laterality: N/A;   LAPAROTOMY N/A 07/15/2018   Procedure: WOUND EXPLORATION; CLOSURE OF ABDOMEN;  Surgeon: Violeta Gelinas, MD;  Location: [facility] OR;  Service: General;  Laterality: N/A;   LAPAROTOMY  07/10/2018   Procedure: Exploratory Laparotomy;  Surgeon: Berna Bue, MD;  Location: [facility] OR;  Service: General;;   MASS EXCISION Left 09/18/2019   Procedure: EXCISION UPPER LEFT INNER THIGH WOUND;  Surgeon: Peggye Form, DO;  [facility];  Service: Plastics;  Laterality: Left;   NEPHROLITHOTOMY Right 07/15/2019   Procedure: NEPHROLITHOTOMY PERCUTANEOUS;  Surgeon: Malen Gauze, MD;  Location: [facility];  Service: Urology;  Laterality: Right;  90 MINS   PEG PLACEMENT N/A 08/14/2018   Procedure: PERCUTANEOUS ENDOSCOPIC GASTROSTOMY (PEG) PLACEMENT;  Surgeon: Violeta Gelinas, MD;  Location: [facility] ENDOSCOPY;  Service: General;  Laterality: N/A;   PERCUTANEOUS TRACHEOSTOMY N/A 08/02/2018   Procedure: PERCUTANEOUS TRACHEOSTOMY;  Surgeon: Violeta Gelinas, MD;  Location: [facility] OR;  Service: General;  Laterality: N/A;   RADIOLOGY WITH ANESTHESIA N/A 07/10/2018   Procedure: IR WITH ANESTHESIA;  Surgeon: Simonne Come, MD;  Location: [facility] OR;  Service: Radiology;  Laterality: N/A;   RADIOLOGY WITH ANESTHESIA Right 07/10/2018   Procedure: Ir With Anesthesia;  Surgeon: Simonne Come, MD;  Location: [facility] OR;  Service: Radiology;  Laterality: Right;   SCROTAL EXPLORATION N/A 07/15/2018   Procedure: SCROTUM DEBRIDEMENT;  Surgeon: Marcine Matar, MD;   Location: [facility] OR;  Service: Urology;  Laterality: N/A;   SHOULDER SURGERY     SKIN SPLIT GRAFT Right 09/19/2018   Procedure: Skin Graft Split Thickness;  Surgeon: Peggye Form, DO;  [facility];  Service: Plastics;  Laterality: Right;   SKIN SPLIT GRAFT N/A 10/03/2018   Procedure: Split thickness skin graft to gluteal area with acell placement;  Surgeon: Peggye Form, DO;  [facility];  Service: Plastics;  Laterality: N/A;  3 hours, please   VACUUM ASSISTED CLOSURE CHANGE N/A 07/12/2018   Procedure: ABDOMINAL VACUUM ASSISTED CLOSURE CHANGE and abdominal washout;  Surgeon: Violeta Gelinas, MD;  Location: [facility] OR;  Service: General;  Laterality: N/A;   WOUND DEBRIDEMENT Left 07/23/2018   Procedure: DEBRIDEMENT LEFT BUTTOCK  WOUND;  Surgeon: Violeta Gelinas, MD;  Location: [facility] OR;  Service: General;  Laterality: Left;   WOUND EXPLORATION Left 07/10/2018   Procedure: WOUND EXPLORATION LEFT GROIN;  Surgeon: Berna Bue, MD;  Location[facility];  Service: General;  Laterality: Left;   Past Medical History:  Diagnosis Date   Acute on chronic respiratory failure with hypoxia (HCC) 06/2018   trach removed 11-16-2018, on vent from jan until may 2020 - uses albuterol prn   Anxiety    Bacteremia due to Pseudomonas 06/2018   Chronic osteomyelitis (HCC)    Chronic pain syndrome    Clostridium difficile colitis 10/30/2019   tx with abx    Depression    DVT (deep venous thrombosis) (HCC) 2020   right brachial post PICC line   History of blood transfusion 06/2018   History of Clostridioides difficile colitis    History of kidney stones    Hypertension    norvasc d/c by pcp on 11/05/19   Multiple traumatic injuries    Penile pain 11/18/2019   Pneumonia 11/2009   2020 x 2   Walker as ambulation aid    Wheelchair bound    electric   Wound discharge    left hip wound with bloody/clear drainage change dressing q day surgilube with gauze, between legs wound using calcium algenate pad bid    Ht 6\' 3"  (1.905 m)   BMI 22.50 kg/m   Opioid Risk Score:   Fall Risk Score:  `1  Depression screen [facility] 2/9     03/31/2022   12:36 PM 01/12/2022   11:17 AM 11/17/2021   11:30 AM 10/06/2021   11:28 AM 08/13/2021   11:33 AM 07/05/2021    2:39 PM 05/28/2021    3:00 PM  Depression screen PHQ 2/9  Decreased Interest  0 1 1 1  0 1  Down, Depressed, Hopeless 3 1 1 1 1  0 1  PHQ - 2 Score 3 1 2 2 2  0 2  Altered sleeping 3        Tired, decreased energy 2        Change in appetite 3        Feeling bad or failure about yourself  3        Trouble concentrating 3        Moving slowly or fidgety/restless 2        Suicidal thoughts 0        PHQ-9 Score 19        Difficult doing work/chores Very difficult         7   Review of Systems  Musculoskeletal:  Positive for back pain and gait problem.       Pain in buttocks, hip, thigh, and knee  All other systems reviewed and are negative.     Objective:   Physical Exam        Assessment & Plan:  1. Multiple Traumatic Injuries/ Multiple Fractures of Pelvis with unstable disruption of pelvic ring: Continue outpatient therapy with [facility].  2. Injury of Urethra: Continue to Monitor.  3. Drainage from wound: Wound Care Following.  4. Anxiety  and Depression: Continue Klonopin and Zoloft.  5. Debility: Continue Physical and Occupational Therapy, Continue to Monitor.  6. Protein-Calorie Malnutrition:  Has a Peg-Tube: Using according to PO intake. Continue to Monitor. Continue to encouraged Healthy Diet Regimen. Continue to Monitor.  7. Chronic Osteomyelitis: ID Following. Continue to Monitor.  8. Chronic Pain Syndrome: Refilled: Fentanyl 75 MCG one patch every other day  #1and MSIR one tablet every 4 hours as needed for pain  #150.We will continue the opioid monitoring program, this consists of regular clinic visits, examinations, urine drug screen, pill counts as well as use of [place] Controlled Substance Reporting system. A  12 month History has been reviewed on the [place] Controlled Substance Reporting System on 07/05/2021      F/U in 1 month   [id number]

## 2022-06-21 ENCOUNTER — Encounter: Payer: Self-pay | Admitting: Registered Nurse

## 2022-06-22 LAB — DRUG TOX MONITOR 1 W/CONF, ORAL FLD
Alprazolam: NEGATIVE ng/mL (ref <0.50)
Amphetamines: NEGATIVE ng/mL (ref <10)
Barbiturates: NEGATIVE ng/mL (ref <10)
Benzodiazepines: POSITIVE ng/mL — AB (ref <0.50)
Buprenorphine: NEGATIVE ng/mL (ref <0.10)
Chlordiazepoxide: NEGATIVE ng/mL (ref <0.50)
Clonazepam: NEGATIVE ng/mL (ref <0.50)
Cocaine: NEGATIVE ng/mL (ref <5.0)
Codeine: NEGATIVE ng/mL (ref <2.5)
Diazepam: NEGATIVE ng/mL (ref <0.50)
Dihydrocodeine: NEGATIVE ng/mL (ref <2.5)
Fentanyl: 8.65 ng/mL — ABNORMAL HIGH (ref <0.10)
Fentanyl: POSITIVE ng/mL — AB (ref <0.10)
Flunitrazepam: NEGATIVE ng/mL (ref <0.50)
Flurazepam: NEGATIVE ng/mL (ref <0.50)
Heroin Metabolite: NEGATIVE ng/mL (ref <1.0)
Hydrocodone: NEGATIVE ng/mL (ref <2.5)
Hydromorphone: NEGATIVE ng/mL (ref <2.5)
Lorazepam: 3.3 ng/mL — ABNORMAL HIGH (ref <0.50)
MARIJUANA: NEGATIVE ng/mL (ref <2.5)
MDMA: NEGATIVE ng/mL (ref <10)
Meprobamate: NEGATIVE ng/mL (ref <2.5)
Methadone: NEGATIVE ng/mL (ref <5.0)
Midazolam: NEGATIVE ng/mL (ref <0.50)
Morphine: 219.1 ng/mL — ABNORMAL HIGH (ref <2.5)
Nicotine Metabolite: NEGATIVE ng/mL (ref <5.0)
Nordiazepam: NEGATIVE ng/mL (ref <0.50)
Norhydrocodone: NEGATIVE ng/mL (ref <2.5)
Noroxycodone: NEGATIVE ng/mL (ref <2.5)
Opiates: POSITIVE ng/mL — AB (ref <2.5)
Oxazepam: NEGATIVE ng/mL (ref <0.50)
Oxycodone: NEGATIVE ng/mL (ref <2.5)
Oxymorphone: NEGATIVE ng/mL (ref <2.5)
Phencyclidine: NEGATIVE ng/mL (ref <10)
Tapentadol: NEGATIVE ng/mL (ref <5.0)
Temazepam: NEGATIVE ng/mL (ref <0.50)
Tramadol: NEGATIVE ng/mL (ref <5.0)
Triazolam: NEGATIVE ng/mL (ref <0.50)
Zolpidem: NEGATIVE ng/mL (ref <5.0)

## 2022-06-22 LAB — DRUG TOX ALC METAB W/CON, ORAL FLD: Alcohol Metabolite: NEGATIVE ng/mL (ref <25)

## 2022-06-23 ENCOUNTER — Other Ambulatory Visit: Payer: Self-pay | Admitting: Physical Medicine and Rehabilitation

## 2022-06-23 NOTE — Telephone Encounter (Signed)
A refill request was sent requesting this medication PARoxetine

## 2022-06-27 ENCOUNTER — Other Ambulatory Visit: Payer: Self-pay | Admitting: Family Medicine

## 2022-06-30 ENCOUNTER — Other Ambulatory Visit: Payer: Self-pay | Admitting: Family

## 2022-06-30 DIAGNOSIS — M8668 Other chronic osteomyelitis, other site: Secondary | ICD-10-CM

## 2022-07-04 ENCOUNTER — Telehealth: Payer: Self-pay | Admitting: Family Medicine

## 2022-07-04 NOTE — Telephone Encounter (Signed)
.  Home Health Certification or Plan of Care Tracking  Is this a Certification or Plan of Care? yes  [facility] Agency: [place] Number:  [id number]  Has charge sheet been attached? Yes  Where has form been placed:  In provider's box  Faxed to:   [phone]

## 2022-07-05 NOTE — Telephone Encounter (Signed)
Please on PCP for review

## 2022-07-05 NOTE — Telephone Encounter (Signed)
Faxed to [phone] on 07/05/2022

## 2022-07-12 ENCOUNTER — Telehealth: Payer: Self-pay

## 2022-07-12 NOTE — Telephone Encounter (Signed)
Pt's nurse case manager Maudry Diego called into the office today. Jenny Reichmann informs me that patient's PEG tube came out months ago and patient had been doing well. Jenny Reichmann reports that after recent assessment the site seemed red, had pin prick opening, and it is draining clear liquid. Jenny Reichmann wanted patient to be evaluated to make sure everything is OK with the site. Pt was scheduled with Alonza Bogus, PA-C on Thursday, 07/14/22 at 1:30 pm.

## 2022-07-13 ENCOUNTER — Encounter (HOSPITAL_BASED_OUTPATIENT_CLINIC_OR_DEPARTMENT_OTHER): Payer: No Typology Code available for payment source | Admitting: Physical Medicine and Rehabilitation

## 2022-07-13 ENCOUNTER — Encounter: Payer: Self-pay | Admitting: Physical Medicine and Rehabilitation

## 2022-07-13 VITALS — BP 162/89 | HR 95

## 2022-07-13 DIAGNOSIS — M8668 Other chronic osteomyelitis, other site: Secondary | ICD-10-CM

## 2022-07-13 DIAGNOSIS — F064 Anxiety 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: Secondary | ICD-10-CM | POA: Diagnosis not present

## 2022-07-13 DIAGNOSIS — G894 Chronic pain syndrome: Secondary | ICD-10-CM | POA: Diagnosis not present

## 2022-07-13 DIAGNOSIS — T07XXXA Unspecified multiple injuries, initial encounter: Secondary | ICD-10-CM

## 2022-07-13 DIAGNOSIS — Z993 Dependence on wheelchair: Secondary | ICD-10-CM

## 2022-07-13 DIAGNOSIS — Z9359 Other cystostomy status: Secondary | ICD-10-CM

## 2022-07-13 DIAGNOSIS — Z933 Colostomy status: Secondary | ICD-10-CM

## 2022-07-13 MED ORDER — MORPHINE SULFATE 15 MG PO TABS: 15.0000 mg | ORAL_TABLET | ORAL | 0 refills | Status: DC | PRN

## 2022-07-13 MED ORDER — FENTANYL 75 MCG/HR TD PT72: 1.0000 | MEDICATED_PATCH | TRANSDERMAL | 0 refills | Status: DC

## 2022-07-13 NOTE — Patient Instructions (Addendum)
Pt is a 57 yr old male with hx of Multitrauma- causing L femoral neck fx, degloving of L hip to going/scrotum, bladder neck trauma- got SPC, developed compartment syndrome -s/p surgery for that; also diverting colostomy, skin grafts, and s/p trach and PEG- PEG is out. 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 S/P screw removal and on IV ABX for L hip osteomyelitis. 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  Has R severe ulnar neuropathy as well. O2 4L full time.     Doesn't have weakness on one side- so don't think he's had a stroke.   I think the MRI, if needs to be ordered, should be ordered by Psychiatry, so they can document what asking/concerned about.  CT 07/11/18 showed no acute abnormality.  If has anoxia due to lack of O2- will not show up on MRI.   2. I think the best choice is neuropsychological testing for memory and cognitive deficits. I'm not sure MRI would show, unless psychiatry looking for anything else - so they would need to manage. I think Neuropsychological testing is appropriate. Let me know if will need an appointment- we have Dr Sima Matas who does testing, evaluation.   3. BP 162/89 today- which is unusual today. Asked them to keep an eye on BP.   4. Refill pain meds- MSIR 15 mg q3 hours prn #240 and Duragesic 75 mcg patches #15- MSIR can wean to q4 hours as needed and see if can tolerate. The one most likely to add to short term memory deficits.   5. Spoke with worker's comp about plan as well as discussing pain meds.   6. F/U in  6 weeks- double appt. Usually needs longer.

## 2022-07-13 NOTE — Telephone Encounter (Signed)
Called and spoke with Maudry Diego. I advised Jenny Reichmann to reach out to interventional radiology for evaluation of PEG site. Jenny Reichmann is aware that we have cancelled upcoming office appt. Cindy verbalized understanding and had no concerns at the end of the call.

## 2022-07-13 NOTE — Progress Notes (Signed)
Subjective:    Patient ID: Christopher [name], male    DOB: [birth year], 57 y.o.   MRN: [id number]  HPI  Pt is a 57 yr old male with hx of Multitrauma- causing L femoral neck fx, degloving of L hip to going/scrotum, bladder neck trauma- got SPC, developed compartment syndrome -s/p surgery for that; also diverting colostomy, skin grafts, and s/p trach and PEG- PEG is out. 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 S/P screw removal and on IV ABX for L hip osteomyelitis. 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  Has R severe ulnar neuropathy as well. O2 3L full time.   Here for f/u on multitrauma and chronic L hip osteomyelitis with associated chronic pain.   Somehow missed an appointment in November.   Working on Anxiety with Dr Luetta Nutting  Also seeing psychologist.  Not wanting to participate in psychologist recommendations.  Said it's not working, but explained it's like going to gym.  Takes awhile.    Supposed to talk to me/PCP-about ordering MRI of brain.   Psychiatry wanting to see if needs an MIR.   Forgets what wants to do; or where left things.   Bumped O2 levels to 4L since gets SOB regularly- hasn't helped tons.    Pain Inventory Average Pain 8 Pain Right Now 8 My pain is constant, sharp, dull, stabbing, and aching  LOCATION OF PAIN  back  BOWEL Number of stools per week: 7 Oral laxative use Yes  Type of laxative senna plus Enema or suppository use No  History of colostomy No  Incontinent No   BLADDER Suprapubic In and out  cath, frequency monthly/prn Able to self cath No  Bladder incontinence No  Frequent urination No  Leakage with coughing No  Difficulty starting stream No  Incomplete bladder emptying No    Mobility use a walker how many minutes can you walk? varies ability to climb steps?  no do you drive?  no  Function Do you have any goals in this area?  no  Neuro/Psych weakness numbness trouble walking spasms dizziness confusion depression anxiety  Prior Studies Any changes since last visit?  no  Physicians involved in your care Any changes since last visit?  no   Family History  Problem Relation Age of Onset   Breast cancer Mother        with mets to the bones   Social History   Socioeconomic History   Marital status: Married    Spouse name: Not on file   Number of children: Not on file   Years of education: Not on file   Highest education level: Not on file  Occupational History   Occupation: Disable  Tobacco Use   Smoking status: Former    Packs/day: 1.00    Years: 20.00    Total pack years: 20.00    Types: Cigarettes    Quit date: 07/10/2018    Years since quitting: 4.0   Smokeless tobacco: Never  Vaping Use   Vaping Use: Never used  Substance  and Sexual Activity   Alcohol use: Never   Drug use: Yes    Types: Oxycodone, Fentanyl    Comment: Fentanyl patch/oxycodone since 06/2018   Sexual activity: Yes  Other Topics Concern   Not on file  Social History Narrative   ** Merged History Encounter **       Social Determinants of Health   Financial Resource Strain: Low Risk  (03/01/2019)   Overall Financial Resource Strain (CARDIA)    Difficulty of Paying Living Expenses: Not very hard  Food Insecurity: Food Insecurity Present (03/01/2019)   Hunger Vital Sign    Worried About Running Out of Food in the Last Year: Sometimes true    Ran Out of Food in the Last Year: Sometimes true  Transportation Needs: No Transportation Needs (03/01/2019)   PRAPARE -  Hydrologist (Medical): No    Lack of Transportation (Non-Medical): No  Physical Activity: Inactive (03/01/2019)   Exercise Vital Sign    Days of Exercise per Week: 0 days    Minutes of Exercise per Session: 0 min  Stress: Stress Concern Present (03/01/2019)   Tw[place]    Feeling of Stress : Rather much  Social Connections: Not on file   Past Surgical History:  Procedure Laterality Date   APPLICATION OF A-CELL OF BACK N/A 08/06/2018   Procedure: Application Of A-Cell Of Back;  Surgeon: [name], DO;  Location: [place];  Service: Plastics;  Laterality: N/A;   APPLICATION OF A-CELL OF EXTREMITY Left 08/06/2018   Procedure: Application Of A-Cell Of Extremity;  Surgeon: [name], DO;  Location: [place];  Service: Plastics;  Laterality: Left;   APPLICATION OF A-CELL OF EXTREMITY Left 09/18/2019   Procedure: APPLICATION OF A-CELL OF EXTREMITY;  Surgeon: [name], DO;  Location: [place];  Service: Plastics;  Laterality: Left;   APPLICATION OF WOUND VAC  07/12/2018   Procedure: Application Of Wound Vac to the Left Thigh and Scrotum.;  Surgeon: Shona Needles, MD;  Location: [place];  Service: Orthopedics;;   APPLICATION OF WOUND VAC  07/10/2018   Procedure: Application Of Wound Vac;  Surgeon: Clovis Riley, MD;  Location: [place];  Service: General;;   COLON SURGERY  2020   colostomy   COLOSTOMY N/A 07/23/2018   Procedure: COLOSTOMY;  Surgeon: Georganna Skeans, MD;  Location: [place];  Service: General;  Laterality: N/A;   CYSTOSCOPY W/ URETERAL STENT PLACEMENT N/A 07/15/2018   Procedure: RETROGRADE URETHROGRAM;  Surgeon: Franchot Gallo, MD;  Location: [place];  Service: Urology;  Laterality: N/A;   CYSTOSCOPY WITH LITHOLAPAXY N/A 05/06/2019   Procedure: CYSTOSCOPY BASKET BLADDER STONE EXTRACTION;  Surgeon: Cleon Gustin, MD;  Location: [facility];  Service:  Urology;  Laterality: N/A;  30 MINS   CYSTOSTOMY N/A 05/06/2019   Procedure: REPLACEMENT OF SUPRAPUBIC CATHETER;  Surgeon: Cleon Gustin, MD;  Location: [facility];  Service: Urology;  Laterality: N/A;   DEBRIDEMENT AND CLOSURE WOUND Left 03/04/2019   Procedure: Excision of hip wound with placement of Acell;  Surgeon: [name], DO;  Location: [place];  Service: Plastics;  Laterality: Left;   ESOPHAGOGASTRODUODENOSCOPY N/A 08/14/2018   Procedure: ESOPHAGOGASTRODUODENOSCOPY (EGD);  Surgeon: Georganna Skeans, MD;  Location: [place];  Service: General;  Laterality: N/A;  bedside   FACIAL RECONSTRUCTION SURGERY     X 2--once as a teenager and second time in his  30's   HARDWARE REMOVAL Left 03/04/2019   Procedure: Left Hip Hardware Removal;  Surgeon: Roby Lofts, MD;  [facility];  Service: Orthopedics;  Laterality: Left;   HIP PINNING,CANNULATED Left 07/12/2018   Procedure: CANNULATED HIP PINNING;  Surgeon: Roby Lofts, MD;  [facility];  Service: Orthopedics;  Laterality: Left;   HIP SURGERY     HOLMIUM LASER APPLICATION Right 07/15/2019   Procedure: HOLMIUM LASER APPLICATION;  Surgeon: Malen Gauze, MD;  Location: [facility];  Service: Urology;  Laterality: Right;   I & D EXTREMITY Left 07/25/2018   Procedure: Debridement of buttock, scrotum and left leg, placement of acell and vac;  Surgeon: Peggye Form, DO;  [facility];  Service: Plastics;  Laterality: Left;   I & D EXTREMITY N/A 08/06/2018   Procedure: Debridement of buttock, scrotum and left leg;  Surgeon: Peggye Form, DO;  [facility];  Service: Plastics;  Laterality: N/A;   I & D EXTREMITY N/A 08/13/2018   Procedure: Debridement of buttock, scrotum and left leg, placement of acell and vac;  Surgeon: Peggye Form, DO;  [facility];  Service: Plastics;  Laterality: N/A;  90 min, please   INCISION AND DRAINAGE HIP Left 09/18/2019   Procedure:  IRRIGATION AND DEBRIDEMENT HIP/ PELVIS WITH WOUND VAC PLACEMENT;  Surgeon: Roby Lofts, MD;  [facility];  Service: Orthopedics;  Laterality: Left;   INCISION AND DRAINAGE OF WOUND N/A 07/18/2018   Procedure: Debridement of left leg, buttocks and scrotal wound with placement of acell and Flexiseal;  Surgeon: Peggye Form, DO;  [facility];  Service: Plastics;  Laterality: N/A;   INCISION AND DRAINAGE OF WOUND Left 08/29/2018   Procedure: Debridement of buttock, scrotum and left leg, placement of acell and vac;  Surgeon: Peggye Form, DO;  [facility];  Service: Plastics;  Laterality: Left;  75 min, please   INCISION AND DRAINAGE OF WOUND Bilateral 10/23/2018   Procedure: DEBRIDEMENT OF BUTTOCK,SCROTUM, AND LEG WOUNDS WITH PLACEMENT OF ACELL- BILATERAL 90 MIN;  Surgeon: Peggye Form, DO;  [facility];  Service: Plastics;  Laterality: Bilateral;   IR ANGIOGRAM PELVIS SELECTIVE OR SUPRASELECTIVE  07/10/2018   IR ANGIOGRAM PELVIS SELECTIVE OR SUPRASELECTIVE  07/10/2018   IR ANGIOGRAM SELECTIVE EACH ADDITIONAL VESSEL  07/10/2018   IR EMBO ART  VEN HEMORR LYMPH EXTRAV  INC GUIDE ROADMAPPING  07/10/2018   IR GASTROSTOMY TUBE MOD SED  01/21/2021   IR NEPHROSTOMY PLACEMENT LEFT  04/05/2019   IR NEPHROSTOMY PLACEMENT RIGHT  05/31/2019   IR US GUIDE BX ASP/DRAIN  07/10/2018   IR US GUIDE VASC ACCESS RIGHT  07/10/2018   IR VENO/EXT/UNI LEFT  07/10/2018   IRRIGATION AND DEBRIDEMENT OF WOUND WITH SPLIT THICKNESS SKIN GRAFT Left 09/19/2018   Procedure: Debridement of gluteal wound with placement of acell to left leg.;  Surgeon: Peggye Form, DO;  [facility];  Service: Plastics;  Laterality: Left;  2.5 hours, please   LAPAROTOMY N/A 07/12/2018   Procedure: EXPLORATORY LAPAROTOMY;  Surgeon: Violeta Gelinas, MD;  Location[facility];  Service: General;  Laterality: N/A;   LAPAROTOMY N/A 07/15/2018   Procedure: WOUND EXPLORATION; CLOSURE OF ABDOMEN;  Surgeon: Violeta Gelinas,  MD;  Location: [facility] OR;  Service: General;  Laterality: N/A;   LAPAROTOMY  07/10/2018   Procedure: Exploratory Laparotomy;  Surgeon: Berna Bue, MD;  Location: [facility] OR;  Service: General;;   MASS  EXCISION Left 09/18/2019   Procedure: EXCISION UPPER LEFT INNER THIGH WOUND;  Surgeon: Peggye Form, DO;  [facility];  Service: Plastics;  Laterality: Left;   NEPHROLITHOTOMY Right 07/15/2019   Procedure: NEPHROLITHOTOMY PERCUTANEOUS;  Surgeon: Malen Gauze, MD;  Location: [facility];  Service: Urology;  Laterality: Right;  90 MINS   PEG PLACEMENT N/A 08/14/2018   Procedure: PERCUTANEOUS ENDOSCOPIC GASTROSTOMY (PEG) PLACEMENT;  Surgeon: Violeta Gelinas, MD;  Location: [facility] ENDOSCOPY;  Service: General;  Laterality: N/A;   PERCUTANEOUS TRACHEOSTOMY N/A 08/02/2018   Procedure: PERCUTANEOUS TRACHEOSTOMY;  Surgeon: Violeta Gelinas, MD;  Location: [facility] OR;  Service: General;  Laterality: N/A;   RADIOLOGY WITH ANESTHESIA N/A 07/10/2018   Procedure: IR WITH ANESTHESIA;  Surgeon: Simonne Come, MD;  Location: [facility] OR;  Service: Radiology;  Laterality: N/A;   RADIOLOGY WITH ANESTHESIA Right 07/10/2018   Procedure: Ir With Anesthesia;  Surgeon: Simonne Come, MD;  Location: [facility] OR;  Service: Radiology;  Laterality: Right;   SCROTAL EXPLORATION N/A 07/15/2018   Procedure: SCROTUM DEBRIDEMENT;  Surgeon: Marcine Matar, MD;  Location: [facility] OR;  Service: Urology;  Laterality: N/A;   SHOULDER SURGERY     SKIN SPLIT GRAFT Right 09/19/2018   Procedure: Skin Graft Split Thickness;  Surgeon: Peggye Form, DO;  [facility];  Service: Plastics;  Laterality: Right;   SKIN SPLIT GRAFT N/A 10/03/2018   Procedure: Split thickness skin graft to gluteal area with acell placement;  Surgeon: Peggye Form, DO;  [facility];  Service: Plastics;  Laterality: N/A;  3 hours, please   VACUUM ASSISTED CLOSURE CHANGE N/A 07/12/2018   Procedure: ABDOMINAL VACUUM ASSISTED CLOSURE CHANGE and abdominal  washout;  Surgeon: Violeta Gelinas, MD;  Location: [facility] OR;  Service: General;  Laterality: N/A;   WOUND DEBRIDEMENT Left 07/23/2018   Procedure: DEBRIDEMENT LEFT BUTTOCK  WOUND;  Surgeon: Violeta Gelinas, MD;  Location: [facility] OR;  Service: General;  Laterality: Left;   WOUND EXPLORATION Left 07/10/2018   Procedure: WOUND EXPLORATION LEFT GROIN;  Surgeon: Berna Bue, MD;  [facility];  Service: General;  Laterality: Left;   Past Medical History:  Diagnosis Date   Acute on chronic respiratory failure with hypoxia (HCC) 06/2018   trach removed 11-16-2018, on vent from jan until may 2020 - uses albuterol prn   Anxiety    Bacteremia due to Pseudomonas 06/2018   Chronic osteomyelitis (HCC)    Chronic pain syndrome    Clostridium difficile colitis 10/30/2019   tx with abx    Depression    DVT (deep venous thrombosis) (HCC) 2020   right brachial post PICC line   History of blood transfusion 06/2018   History of Clostridioides difficile colitis    History of kidney stones    Hypertension    norvasc d/c by pcp on 11/05/19   Multiple traumatic injuries    Penile pain 11/18/2019   Pneumonia 11/2009   2020 x 2   Walker as ambulation aid    Wheelchair bound    electric   Wound discharge    left hip wound with bloody/clear drainage change dressing q day surgilube with gauze, between legs wound using calcium algenate pad bid   BP (!) 162/89   Pulse 95   SpO2 93%   Opioid Risk Score:   Fall Risk Score:  `1  Depression screen [facility] 2/9     06/17/2022   12:54 PM 03/31/2022   12:36 PM 01/12/2022   11:17 AM  11/17/2021   11:30 AM 10/06/2021   11:28 AM 08/13/2021   11:33 AM 07/05/2021    2:39 PM  Depression screen PHQ 2/9  Decreased Interest 0  0 1 1 1  0  Down, Depressed, Hopeless 0 3 1 1 1 1  0  PHQ - 2 Score 0 3 1 2 2 2  0  Altered sleeping  3       Tired, decreased energy  2       Change in appetite  3       Feeling bad or failure about yourself   3       Trouble concentrating  3        Moving slowly or fidgety/restless  2       Suicidal thoughts  0       PHQ-9 Score  19       Difficult doing work/chores  Very difficult          Review of Systems  Musculoskeletal:  Positive for gait problem.       Spasms  Neurological:  Positive for dizziness, tremors, weakness and numbness.  Psychiatric/Behavioral:  Positive for confusion and dysphoric mood. The patient is nervous/anxious.   All other systems reviewed and are negative.     Objective:   Physical Exam  Awake, alert, appropriate, in power w/c; accompanied by wife; on O2 4L by [place], NAD  More depressed affect     Assessment & Plan:   Pt is a 57 yr old male with hx of Multitrauma- causing L femoral neck fx, degloving of L hip to going/scrotum, bladder neck trauma- got SPC, developed compartment syndrome -s/p surgery for that; also diverting colostomy, skin grafts, and s/p trach and PEG- PEG is out. 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 S/P screw removal and on IV ABX for L hip osteomyelitis. 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  Has R severe ulnar neuropathy as well. O2 4L full time.     Doesn't have weakness on one side- so don't think he's had a stroke.   I think the MRI, if needs to be ordered, should be ordered by Psychiatry, so they can document what asking/concerned about.  CT 07/11/18 showed no acute abnormality.  If has anoxia due to lack of O2- will not show up on MRI.   2. I think the best choice is neuropsychological testing for memory and cognitive  deficits. I'm not sure MRI would show, unless psychiatry looking for anything else - so they would need to manage.   3. BP 162/89 today- which is unusual today. Asked them to keep an eye on BP.   4. Refill pain meds- MSIR 15 mg q3 hours prn #240 and Duragesic 75 mcg patches #15- can try to wean the MSIR to q4 hours prn- MSIR can wean to q4 hours as needed and see if can tolerate. The one most likely to add to short term memory deficits.   5. Spoke with worker's comp about plan as well as discussing pain meds.  6. F/U in 6 weeks- double appt.    I spent a total of 31   minutes on total care today- >50% coordination of care- due to discussing about pain meds, and MRI vs neuropsych testing vs both.

## 2022-07-14 ENCOUNTER — Ambulatory Visit: Payer: Self-pay | Admitting: Gastroenterology

## 2022-07-22 ENCOUNTER — Other Ambulatory Visit: Payer: Self-pay | Admitting: Family Medicine

## 2022-07-22 ENCOUNTER — Ambulatory Visit (HOSPITAL_COMMUNITY)
Admission: RE | Admit: 2022-07-22 | Discharge: 2022-07-22 | Disposition: A | Discharge status: Home / Self Care | Payer: No Typology Code available for payment source | Source: Ambulatory Visit | Attending: Nurse Practitioner | Admitting: Nurse Practitioner

## 2022-07-22 DIAGNOSIS — K4020 Bilateral inguinal hernia, without obstruction or gangrene, not specified as recurrent: Secondary | ICD-10-CM

## 2022-07-22 DIAGNOSIS — K9400 Colostomy complication, unspecified: Secondary | ICD-10-CM | POA: Diagnosis not present

## 2022-07-22 DIAGNOSIS — Z933 Colostomy status: Secondary | ICD-10-CM | POA: Diagnosis present

## 2022-07-22 DIAGNOSIS — L259 Unspecified contact dermatitis, unspecified cause: Secondary | ICD-10-CM | POA: Insufficient documentation

## 2022-07-22 DIAGNOSIS — M8668 Other chronic osteomyelitis, other site: Secondary | ICD-10-CM | POA: Diagnosis not present

## 2022-07-22 DIAGNOSIS — Z993 Dependence on wheelchair: Secondary | ICD-10-CM | POA: Insufficient documentation

## 2022-07-22 DIAGNOSIS — Z4659 Encounter for fitting and adjustment of other gastrointestinal appliance and device: Secondary | ICD-10-CM | POA: Diagnosis not present

## 2022-07-22 DIAGNOSIS — L24B3 Irritant contact dermatitis related to fecal or urinary stoma or fistula: Secondary | ICD-10-CM

## 2022-07-22 DIAGNOSIS — F419 Anxiety disorder, unspecified: Secondary | ICD-10-CM | POA: Diagnosis not present

## 2022-07-22 DIAGNOSIS — K435 Parastomal hernia without obstruction or  gangrene: Secondary | ICD-10-CM | POA: Diagnosis not present

## 2022-07-22 NOTE — Progress Notes (Signed)
[facility]   Reason for visit:  LLQ colostomy HPI:  LLQ colostomy Parastomal hernia Chronic left hip wound, osteomyelitis G tube site leaking, tube  has been removed Past Medical History:  Diagnosis Date   Acute on chronic respiratory failure with hypoxia ([place]) 06/2018   trach removed 11-16-2018, on vent from [place] until may 2020 - uses albuterol prn   Anxiety    Bacteremia due to Pseudomonas 06/2018   Chronic osteomyelitis ([place])    Chronic pain syndrome    Clostridium difficile colitis 10/30/2019   tx with abx    Depression    DVT (deep venous thrombosis) ([place]) 2020   right brachial post PICC line   History of blood transfusion 06/2018   History of Clostridioides difficile colitis    History of kidney stones    Hypertension    norvasc d/c by pcp on 11/05/19   Multiple traumatic injuries    Penile pain 11/18/2019   Pneumonia 11/2009   2020 x 2   Walker as ambulation aid    Wheelchair bound    electric   Wound discharge    left hip wound with bloody/clear drainage change dressing q day surgilube with gauze, between legs wound using calcium algenate pad bid   Family History  Problem Relation Age of Onset   Breast cancer Mother        with mets to the bones   Allergies  Allergen Reactions   Methadone Other (See Comments)    Hallucinations/confusion   Current Outpatient Medications  Medication Sig Dispense Refill Last Dose   albuterol (VENTOLIN HFA) 108 (90 Base) MCG/ACT inhaler INHALE 2 PUFFS BY MOUTH EVERY 6 HOURS AS NEEDED FOR WHEEZE OR SHORTNESS OF BREATH 18 each 1    Blood Pressure Monitoring (BLOOD PRESSURE CUFF) MISC Use daily as needed to check blood pressure. 1 each 0    ciprofloxacin (CIPRO) 500 MG tablet Take 1 tablet (500 mg total) by mouth 2 (two) times daily. 60 tablet 11    dextromethorphan-guaiFENesin (MUCINEX DM) 30-600 MG 12hr tablet Take 2 tablets by mouth 2 (two) times daily.      famotidine (PEPCID) 40 MG tablet TAKE 1 TABLET BY MOUTH  TWICE A DAY (Patient taking differently: Take 40 mg by mouth 2 (two) times daily.) 180 tablet 1    fentaNYL (DURAGESIC) 75 MCG/HR Place 1 patch onto the skin every other day. 15 patch 0    gabapentin (NEURONTIN) 300 MG capsule TAKE 1 CAPSULE BY MOUTH THREE TIMES A DAY 90 capsule 5    lidocaine (LIDODERM) 5 % APPLY 3 PATCHES TO SKIN EVERY 12 HOURS AND DISCARD WITHING 12 HOURS OR AS DIRECTED 90 patch 5    LINZESS 72 MCG capsule TAKE 2 CAPSULES (145 MCG TOTAL) BY MOUTH DAILY BEFORE BREAKFAST. 30 capsule 5    LORazepam (ATIVAN) 0.5 MG tablet Take 0.5 mg by mouth 3 (three) times daily.      melatonin ([employer] MELATONIN) 3 MG TABS tablet Take 1 tablet (3 mg total) by mouth at bedtime. 10 tablet 0    methocarbamol (ROBAXIN) 500 MG tablet Take 1 tablet (500 mg total) by mouth every 6 (six) hours as needed for muscle spasms. 120 tablet 5    morphine (MSIR) 15 MG tablet Take 1 tablet (15 mg total) by mouth every 3 (three) hours as needed for severe pain. 240 tablet 0    Multiple Vitamin (MULTIVITAMIN WITH MINERALS) TABS tablet Take 1 tablet by mouth daily.  naloxone (NARCAN) nasal spray 4 mg/0.1 mL To use if pt develops unconsciousness or confusion that family thinks is related to opioids. 1 each 3    OLANZapine (ZYPREXA) 10 MG tablet Take 10 mg by mouth at bedtime.      OLANZapine (ZYPREXA) 2.5 MG tablet Take 2.5 mg by mouth in the morning and at bedtime.      oxybutynin (DITROPAN) 5 MG tablet Take 5 mg by mouth See admin instructions. Take 5 mg by mouth three times a day and an additional 5 mg once daily as needed for urinary urgency      PARoxetine (PAXIL) 40 MG tablet TAKE 1 TABLET BY MOUTH EVERYDAY AT BEDTIME 90 tablet 2    Probiotic Product (PROBIOTIC COLON SUPPORT) CAPS Take 1 capsule by mouth daily.      promethazine (PHENERGAN) 12.5 MG tablet TAKE 1 TABLET (12.5 MG TOTAL) BY MOUTH EVERY 6 (SIX) HOURS AS NEEDED FOR NAUSEA, VOMITING OR REFRACTORY NAUSEA / VOMITING. 90 tablet 5    senna-docusate  (SENOKOT-S) 8.6-50 MG tablet Take 2 tablets by mouth 3 (three) times daily.      umeclidinium-vilanterol (ANORO ELLIPTA) 62.5-25 MCG/ACT AEPB Inhale 1 puff into the lungs daily. 1 each 5    vancomycin (VANCOCIN) 125 MG capsule Take 1 capsule (125 mg total) by mouth in the morning and at bedtime. 60 capsule 11    No current facility-administered medications for this encounter.   ROS  Review of Systems  Gastrointestinal:        Parastomal hernia with new bulging along left side abdomen and around stoma Former feeding tube site is reopened and draining  Musculoskeletal:        Chronic pain Wheelchair bound  Skin:  Positive for color change, rash and wound.       Chronic left hip wound Peristomal breakdown Medical adhesive related skin injury to feeding tube periwound skin  Neurological:  Positive for weakness.  Psychiatric/Behavioral:  The patient is nervous/anxious.        Anxiety  All other systems reviewed and are negative.  Vital signs:  BP (!) 165/92 (BP Location: Right Arm)   Pulse 92   Temp 98 F (36.7 C) (Oral)   Resp 20   SpO2 93%  Exam:  Physical Exam Vitals reviewed.  Abdominal:     Hernia: A hernia is present.  Skin:    General: Skin is warm and dry.     Findings: Erythema, lesion and rash present.  Neurological:     Mental Status: He is alert and oriented to person, place, and time.  Psychiatric:     Comments: Anxiety     Stoma type/location:  RLQ colostomy with parastomal hernia, recent loss in pouch security with change in abdominal plane Stomal assessment/size:  2" pink and moist, budded Peristomal assessment:  erythema, tenderness Treatment options for stomal/peristomal skin:  Has nonintact lesions at 3 and 9 o'clock.  Due to hernia, I think convex pouch with plastic ring is creating pressure points. Today, I will implement topical steroid spray to reddened skin, stoma powder and skin prep to open wounds and  barrier ring and adding Mio flip pouch  designed to cup around bulging areas.  Patient cannot tolerate wearing a belt due to chronic left hip wound.  LUQ feeding tube site that had closed has now reopened and is draining.  There is medical adhesive related skin injury to periwound skin due to stripping.  I will implement silver hydrofiber, cut into thin strip  and fill defect.  Cover with silicone foam dressing for atraumatic tape removal Output: soft brown stool Ostomy pouching: 1pc. Mio flip  Education provided:  see above New ostomy pouching,  peristomal skin care Wound care to former feeding tube site    Impression/dx  Irritant contact dermatitis Parastomal hernia Nonhealing wound to feeding tube site Nonhealing wound left hip, chronic osteomyelitis Discussion  Call next week and let me know if you want to continue this pouch and I will update orders with workmans comp Plan  See back in 2 weeks    Visit time: 55 minutes.   Domenic Moras FNP-BC

## 2022-07-22 NOTE — Discharge Instructions (Signed)
Switched to 1 piece convex  Spray steroid around all red skin pat dry if needed Powder and skin prep to open wounds Barrier ring and pouch Warm after applying

## 2022-07-23 DIAGNOSIS — K9400 Colostomy complication, unspecified: Secondary | ICD-10-CM | POA: Insufficient documentation

## 2022-07-23 DIAGNOSIS — L24B3 Irritant contact dermatitis related to fecal or urinary stoma or fistula: Secondary | ICD-10-CM | POA: Insufficient documentation

## 2022-07-23 DIAGNOSIS — Z4659 Encounter for fitting and adjustment of other gastrointestinal appliance and device: Secondary | ICD-10-CM | POA: Insufficient documentation

## 2022-07-23 DIAGNOSIS — K4020 Bilateral inguinal hernia, without obstruction or gangrene, not specified as recurrent: Secondary | ICD-10-CM | POA: Insufficient documentation

## 2022-07-25 ENCOUNTER — Encounter: Payer: Self-pay | Admitting: Family Medicine

## 2022-07-25 ENCOUNTER — Ambulatory Visit (INDEPENDENT_AMBULATORY_CARE_PROVIDER_SITE_OTHER): Payer: Self-pay | Admitting: Family Medicine

## 2022-07-25 VITALS — BP 162/90 | HR 85 | Temp 98.4°F

## 2022-07-25 DIAGNOSIS — M8668 Other chronic osteomyelitis, other site: Secondary | ICD-10-CM

## 2022-07-25 DIAGNOSIS — I83893 Varicose veins of bilateral lower extremities with other complications: Secondary | ICD-10-CM

## 2022-07-25 DIAGNOSIS — L988 Other specified disorders of the skin and subcutaneous tissue: Secondary | ICD-10-CM

## 2022-07-25 DIAGNOSIS — R030 Elevated blood-pressure reading, without diagnosis of hypertension: Secondary | ICD-10-CM

## 2022-07-25 DIAGNOSIS — R5381 Other malaise: Secondary | ICD-10-CM

## 2022-07-25 LAB — CBC
HCT: 33.8 % — ABNORMAL LOW (ref 39.0–52.0)
Hemoglobin: 11.4 g/dL — ABNORMAL LOW (ref 13.0–17.0)
MCHC: 33.6 g/dL (ref 30.0–36.0)
MCV: 93.5 fl (ref 78.0–100.0)
Platelets: 220 10*3/uL (ref 150.0–400.0)
RBC: 3.61 Mil/uL — ABNORMAL LOW (ref 4.22–5.81)
RDW: 14.2 % (ref 11.5–15.5)
WBC: 4.7 10*3/uL (ref 4.0–10.5)

## 2022-07-25 LAB — COMPREHENSIVE METABOLIC PANEL
ALT: 12 U/L (ref 0–53)
AST: 18 U/L (ref 0–37)
Albumin: 3.5 g/dL (ref 3.5–5.2)
Alkaline Phosphatase: 107 U/L (ref 39–117)
BUN: 14 mg/dL (ref 6–23)
CO2: 27 mEq/L (ref 19–32)
Calcium: 9.2 mg/dL (ref 8.4–10.5)
Chloride: 106 mEq/L (ref 96–112)
Creatinine, Ser: 0.7 mg/dL (ref 0.40–1.50)
GFR: 103.5 mL/min (ref >60.00)
Glucose, Bld: 107 mg/dL — ABNORMAL HIGH (ref 70–99)
Potassium: 3.8 mEq/L (ref 3.5–5.1)
Sodium: 141 mEq/L (ref 135–145)
Total Bilirubin: 0.3 mg/dL (ref 0.2–1.2)
Total Protein: 6.2 g/dL (ref 6.0–8.3)

## 2022-07-25 LAB — TSH: TSH: 0.71 u[IU]/mL (ref 0.35–5.50)

## 2022-07-25 NOTE — Assessment & Plan Note (Signed)
Elevated today though he has been well-controlled the last several office visits.  They will continue to monitor at home and in his upcoming appointments.  They will let us know if persistently elevated.

## 2022-07-25 NOTE — Progress Notes (Signed)
   LENUS TRAUGER is a 57 y.o. male who presents today for an office visit.  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  No red flags concerning for infection though is on chronic ciprofloxacin and vancomycin per ID for his osteomyelitis.  Will place referral to surgery for further management.  Leg Swelling Likely venous insufficiency and positional edema however he is at increased risk for DVT given that he is wheelchair-bound and is largely immobile at baseline.  Due to chronic osteomyelitis D-dimer would not be insightful at this point.  We will check lower extremity ultrasound to rule out DVT.  Will also check CBC, c-Met, and TSH.  Chronic Problems Addressed Today: Elevated blood pressure reading Elevated today though he has been well-controlled the last several office visits.  They will continue to monitor at home and in his upcoming appointments.  They will let us know if persistently elevated.  Chronic osteomyelitis of hip ([place]) Following with ID.  On chronic ciprofloxacin.  On vancomycin as prophylaxis against C. difficile.     Subjective:  HPI:  See A/P for status of chronic conditions.  Patient is here with 2 new concerns.  First concern today is leaking from PEG site.  Patient had his most recent PEG tube placed about a year and a half ago related to hospitalization secondary to osteomyelitis.  This fell out on its own about 7 or 8 months ago.  He was told that the area would eventually heal up.  This was doing well until a couple of weeks ago when he started noticing more drainage from the site.  No pain.  Drainage described as clear fluid that is sometimes mixed with blood.  No redness around the site.  Drainage gets much worse after eating.  They have contacted interventional radiology and gastroenterology who told him there was nothing else that they can do for this.  He  has also had worsening issues with lower leg swelling.  This started a couple of weeks ago suddenly.  He is in a wheelchair though does try to elevate his legs frequently.  No reported chest pain or shortness of breath.  Leg swelling has been stable the last week or so.       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  Site of previous PEG tube insertion visualized without any sort of surrounding erythema or fluctuance.  Serosanguineous drainage present. MSK: - Legs: 2+ pitting edema to mid thigh bilaterally.  Neurovascular intact distally. Neuro: Grossly normal, moves all extremities Psych: Normal affect and thought content      Yassmin Binegar M. Jerline Pain, MD 07/25/2022 11:08 AM

## 2022-07-25 NOTE — Patient Instructions (Signed)
It was very nice to see you today!  I will refer you to see a surgeon.  I believe that you may have developed a fistula due to your previous PEG site.  The swelling in your legs is probably due to your positioning gravity.  Please try to keep your legs elevated and avoid salt.  Please use compression if needed.  We will check blood work and an ultrasound to make sure there are no other causes for the swelling including blood clots.  Take care, Dr Jerline Pain  PLEASE NOTE:  If you had any lab tests, please let us know if you have not heard back within a few days. You may see your results on mychart before we have a chance to review them but we will give you a call once they are reviewed by [place].   If we ordered any referrals today, please let us know if you have not heard from their office within the next week.   If you had any urgent prescriptions sent in today, please check with the pharmacy within an hour of our visit to make sure the prescription was transmitted appropriately.   Please try these tips to maintain a healthy lifestyle:  Eat at least 3 REAL meals and 1-2 snacks per day.  Aim for no more than 5 hours between eating.  If you eat breakfast, please do so within one hour of getting up.   Each meal should contain half fruits/vegetables, one quarter protein, and one quarter carbs (no bigger than a computer mouse)  Cut down on sweet beverages. This includes juice, soda, and sweet tea.   Drink at least 1 glass of water with each meal and aim for at least 8 glasses per day  Exercise at least 150 minutes every week.

## 2022-07-25 NOTE — Assessment & Plan Note (Signed)
Following with ID.  On chronic ciprofloxacin.  On vancomycin as prophylaxis against C. difficile.

## 2022-07-26 ENCOUNTER — Ambulatory Visit (HOSPITAL_BASED_OUTPATIENT_CLINIC_OR_DEPARTMENT_OTHER)
Admission: RE | Admit: 2022-07-26 | Discharge: 2022-07-26 | Disposition: A | Discharge status: Home / Self Care | Payer: No Typology Code available for payment source | Source: Ambulatory Visit | Attending: Family Medicine | Admitting: Family Medicine

## 2022-07-26 DIAGNOSIS — I83893 Varicose veins of bilateral lower extremities with other complications: Secondary | ICD-10-CM | POA: Insufficient documentation

## 2022-07-26 NOTE — Progress Notes (Signed)
Please inform patient of the following:  His blood counts dropped a little bit compared to a couple of months ago.  The rest of his labs are all stable without any obvious cause of his leg swelling.  His drop in blood counts is probably nothing significant however would like for him to come back to recheck in a couple weeks to make sure that is stable.  Please place future order for CBC.  We will contact him with results on his ultrasound once we receive them.

## 2022-07-27 ENCOUNTER — Other Ambulatory Visit: Payer: Self-pay

## 2022-07-27 DIAGNOSIS — D649 Anemia, unspecified: Secondary | ICD-10-CM

## 2022-07-27 NOTE — Progress Notes (Signed)
Please inform patient of the following:  His ultrasound is negative for blood clots. His leg swelling is probably due to fluid retention related to gravity and sodium intake.  Would like for him to continue with leg elevation and compression.  Avoid salt.  Would like for him to let us know if symptoms or not improving in the next couple of weeks.

## 2022-08-01 ENCOUNTER — Telehealth: Payer: Self-pay | Admitting: Family Medicine

## 2022-08-01 ENCOUNTER — Other Ambulatory Visit (HOSPITAL_COMMUNITY): Payer: Self-pay | Admitting: Nurse Practitioner

## 2022-08-01 DIAGNOSIS — L24B3 Irritant contact dermatitis related to fecal or urinary stoma or fistula: Secondary | ICD-10-CM

## 2022-08-01 DIAGNOSIS — K9400 Colostomy complication, unspecified: Secondary | ICD-10-CM

## 2022-08-01 NOTE — Telephone Encounter (Signed)
Ok with me. Please place any necessary orders. 

## 2022-08-01 NOTE — Telephone Encounter (Signed)
Starla with [facility] did a home visit today. States pt's legs are still severely swollen & abdomen is distended. Pt cannot lay back to elevate legs due to feeling like he is drowning. Pt also has a lot of mucus in his catheter but not due to be changed yet.  BP: 176/110 HR: 108 Temp: 99.3  Should she collect a UA specimen? She is also asking if a diuretic could be called in to help with the swelling.

## 2022-08-01 NOTE — Telephone Encounter (Signed)
.  Home Health Verbal Orders  Agency:  [facility]  Caller: [place] and title   Requesting OT/ PT/ Skilled nursing/ Social Work/ Speech:  To continue home health services.  Reason for Request:    Frequency:  1x a week for 9 more weeks  HH needs F2F w/in last 30 days

## 2022-08-01 NOTE — Telephone Encounter (Signed)
Please advise 

## 2022-08-02 ENCOUNTER — Other Ambulatory Visit: Payer: Self-pay | Admitting: *Deleted

## 2022-08-02 ENCOUNTER — Ambulatory Visit (HOSPITAL_COMMUNITY): Payer: Self-pay | Admitting: Nurse Practitioner

## 2022-08-02 MED ORDER — FUROSEMIDE 40 MG PO TABS: 40.0000 mg | ORAL_TABLET | Freq: Every day | ORAL | 0 refills | Status: DC

## 2022-08-02 NOTE — Telephone Encounter (Signed)
Call Starla to give VO at [phone] stated patient still with elevated BP today no temperature at this time  Patient unable to lay down due to swelling on abdominal area and legs, feeling like he is SOB when laying down  She is also asking if a diuretic could be called in to help with the swelling.  Please advise

## 2022-08-02 NOTE — Telephone Encounter (Signed)
Ok to send in lasix 86m daily. Please have him schedule an appointment here ASAP.  If he is experiencing significant shortness of breath, even when laying flat he needs to go to the ED.  CAlgis Greenhouse PJerline Pain MD 08/02/2022 3:17 PM

## 2022-08-02 NOTE — Telephone Encounter (Signed)
Spoke with Starla at [phone] notified Rx lasix 17m send to pharmacy  Advise to schedule an OV ASAP if continue with SOB even when laying flat needs to go to ED

## 2022-08-12 ENCOUNTER — Telehealth: Payer: Self-pay | Admitting: Family Medicine

## 2022-08-12 NOTE — Telephone Encounter (Signed)
.  Home Health Certification or Plan of Care Tracking  Is this a Certification or Plan of Care? Yes  [place] Agency: [place] Number:  [phone]  Has charge sheet been attached? yes  Where has form been placed:  In provider's box  Faxed to:   [phone]

## 2022-08-15 DIAGNOSIS — K219 Gastro-esophageal reflux disease without esophagitis: Secondary | ICD-10-CM

## 2022-08-15 DIAGNOSIS — G8220 Paraplegia, unspecified: Secondary | ICD-10-CM

## 2022-08-15 DIAGNOSIS — I700 Atherosclerosis of aorta: Secondary | ICD-10-CM

## 2022-08-15 DIAGNOSIS — Z87440 Personal history of urinary (tract) infections: Secondary | ICD-10-CM

## 2022-08-15 DIAGNOSIS — E876 Hypokalemia: Secondary | ICD-10-CM

## 2022-08-15 DIAGNOSIS — G4700 Insomnia, unspecified: Secondary | ICD-10-CM

## 2022-08-15 DIAGNOSIS — J9611 Chronic respiratory failure with hypoxia: Secondary | ICD-10-CM

## 2022-08-15 DIAGNOSIS — Z87891 Personal history of nicotine dependence: Secondary | ICD-10-CM

## 2022-08-15 DIAGNOSIS — Z79891 Long term (current) use of opiate analgesic: Secondary | ICD-10-CM

## 2022-08-15 DIAGNOSIS — F419 Anxiety disorder, unspecified: Secondary | ICD-10-CM

## 2022-08-15 DIAGNOSIS — Z792 Long term (current) use of antibiotics: Secondary | ICD-10-CM

## 2022-08-15 DIAGNOSIS — R1310 Dysphagia, unspecified: Secondary | ICD-10-CM

## 2022-08-15 DIAGNOSIS — K5903 Drug induced constipation: Secondary | ICD-10-CM

## 2022-08-15 DIAGNOSIS — I10 Essential (primary) hypertension: Secondary | ICD-10-CM

## 2022-08-15 DIAGNOSIS — M86652 Other chronic osteomyelitis, left thigh: Secondary | ICD-10-CM

## 2022-08-15 DIAGNOSIS — F32A Depression, unspecified: Secondary | ICD-10-CM

## 2022-08-15 DIAGNOSIS — Z466 Encounter for fitting and adjustment of urinary device: Secondary | ICD-10-CM

## 2022-08-15 DIAGNOSIS — D649 Anemia, unspecified: Secondary | ICD-10-CM

## 2022-08-15 DIAGNOSIS — Z933 Colostomy status: Secondary | ICD-10-CM

## 2022-08-15 DIAGNOSIS — Z9981 Dependence on supplemental oxygen: Secondary | ICD-10-CM

## 2022-08-15 DIAGNOSIS — G894 Chronic pain syndrome: Secondary | ICD-10-CM

## 2022-08-15 DIAGNOSIS — Z993 Dependence on wheelchair: Secondary | ICD-10-CM

## 2022-08-15 DIAGNOSIS — Z435 Encounter for attention to cystostomy: Secondary | ICD-10-CM

## 2022-08-15 DIAGNOSIS — T50905D Adverse effect of unspecified drugs, medicaments and biological substances, subsequent encounter: Secondary | ICD-10-CM

## 2022-08-15 DIAGNOSIS — F1120 Opioid dependence, uncomplicated: Secondary | ICD-10-CM

## 2022-08-15 DIAGNOSIS — R339 Retention of urine, unspecified: Secondary | ICD-10-CM

## 2022-08-15 DIAGNOSIS — E43 Unspecified severe protein-calorie malnutrition: Secondary | ICD-10-CM

## 2022-08-15 NOTE — Telephone Encounter (Signed)
Form placed in PCP office to be reviewed

## 2022-08-16 ENCOUNTER — Ambulatory Visit: Payer: Self-pay | Admitting: Internal Medicine

## 2022-08-17 ENCOUNTER — Encounter
Payer: No Typology Code available for payment source | Attending: Physical Medicine and Rehabilitation | Admitting: Physical Medicine and Rehabilitation

## 2022-08-17 ENCOUNTER — Encounter: Payer: Self-pay | Admitting: Physical Medicine and Rehabilitation

## 2022-08-17 VITALS — BP 154/90 | HR 67 | Ht 75.0 in

## 2022-08-17 DIAGNOSIS — T07XXXA Unspecified multiple injuries, initial encounter: Secondary | ICD-10-CM | POA: Insufficient documentation

## 2022-08-17 DIAGNOSIS — G931 Anoxic brain damage, not elsewhere classified: Secondary | ICD-10-CM | POA: Diagnosis present

## 2022-08-17 DIAGNOSIS — G8921 Chronic pain due to trauma: Secondary | ICD-10-CM | POA: Insufficient documentation

## 2022-08-17 DIAGNOSIS — M792 Neuralgia and neuritis, unspecified: Secondary | ICD-10-CM | POA: Diagnosis present

## 2022-08-17 DIAGNOSIS — Z933 Colostomy status: Secondary | ICD-10-CM | POA: Diagnosis not present

## 2022-08-17 DIAGNOSIS — M8668 Other chronic osteomyelitis, other site: Secondary | ICD-10-CM | POA: Insufficient documentation

## 2022-08-17 MED ORDER — FENTANYL 75 MCG/HR TD PT72: 1.0000 | MEDICATED_PATCH | TRANSDERMAL | 0 refills | Status: DC

## 2022-08-17 MED ORDER — MORPHINE SULFATE 15 MG PO TABS: 15.0000 mg | ORAL_TABLET | ORAL | 0 refills | Status: DC | PRN

## 2022-08-17 MED ORDER — IMIPRAMINE HCL 25 MG PO TABS: 25.0000 mg | ORAL_TABLET | Freq: Three times a day (TID) | ORAL | 5 refills | Status: DC

## 2022-08-17 NOTE — Progress Notes (Signed)
Subjective:    Patient ID: Christopher [name], male    DOB: 1965/07/06, 57 y.o.   MRN: [id number]  HPI Pt is a 57 yr old male with hx of Multitrauma- causing L femoral neck fx, degloving of L hip to going/scrotum, bladder neck trauma- got SPC, developed compartment syndrome -s/p surgery for that; also diverting colostomy, skin grafts, and s/p trach and PEG- PEG is out. 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 S/P screw removal and on IV ABX for L hip osteomyelitis. 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  Has R severe ulnar neuropathy as well. O2 3L full time.   Here for f/u on multitrauma and chronic L hip osteomyelitis with associated chronic pain.    Abdomen get rounder, so went to ostomy clinic- and got different kind of bag- working better.   A lot of swelling in legs- seen by PCP- didn't have DVTs Was prescribed Lasix- gone down a lot.     BP been higher lately- DBP 90s lately. So plan to make appointment with PCP about this.    Still on 3L O2  Has tried to reduce pain meds some- stretching to 4-6- hours between pain meds- has been 1 month and has 80 pills of MSIR left.    Bladder- irritation feeling- Christopher [name] was started- given 6 weeks of samples- started 2 weeks ago- hasn't noticed much improvement Not as often but still having some spasm- like needs ot pee real bad.  Occurs a lot- kept also on Oxybutynin-   Never tried imipramine- for bladder spasms.    Irritability- is the same-  Saw Psychiatry lately- didn't want to change  meds right now- but sees Dr Christopher [name] (not PA) next week.    Per pt, memory still very poor-  Per wife, no change in memory or behavior with reduction of pain meds.    A lot of anger, about not being able to drive [place]- become the  dream car but before was something old that he could afford.  Feels like almost possessed by car, like stephen king movie.   Not doing counseling right now.       Pain Inventory Average Pain 9 Pain Right Now 7 My pain is constant, sharp, burning, dull, stabbing, tingling, and aching  LOCATION OF PAIN  Knee,back, buttock, hip  BOWEL  History of colostomy Yes    BLADDER Suprapubic In & out monthly / prn Mobility use a walker ability to climb steps?  yes do you drive?  no use a wheelchair needs help with transfers Do you have any goals in this area?  yes  Function employed # of hrs/week disability   Neuro/Psych weakness numbness tremor tingling trouble walking spasms dizziness confusion depression anxiety  Prior Studies Any changes since last visit?  yes ultrasound of legs  Physicians involved in your care Any changes since last visit?  no   Family History  Problem Relation Age of Onset   Breast cancer Mother        with mets  to the bones   Social History   Socioeconomic History   Marital status: Married    Spouse name: Not on file   Number of children: Not on file   Years of education: Not on file   Highest education level: Not on file  Occupational History   Occupation: Disable  Tobacco Use   Smoking status: Former    Packs/day: 1.00    Years: 20.00    Total pack years: 20.00    Types: Cigarettes    Quit date: 07/10/2018    Years since quitting: 4.1   Smokeless tobacco: Never  Vaping Use   Vaping Use: Never used  Substance and Sexual Activity   Alcohol use: Never   Drug use: Yes    Types: Oxycodone, Fentanyl    Comment: Fentanyl patch/oxycodone since 06/2018   Sexual activity: Yes  Other Topics  Concern   Not on file  Social History Narrative   ** Merged History Encounter **       Social Determinants of Health   Financial Resource Strain: Low Risk  (03/01/2019)   Overall Financial Resource Strain (CARDIA)    Difficulty of Paying Living Expenses: Not very hard  Food Insecurity: Food Insecurity Present (03/01/2019)   Hunger Vital Sign    Worried About [place] in the Last Year: Sometimes true    Ran Out of Food in the Last Year: Sometimes true  Transportation Needs: No Transportation Needs (03/01/2019)   PRAPARE - Hydrologist (Medical): No    Lack of Transportation (Non-Medical): No  Physical Activity: Inactive (03/01/2019)   Exercise Vital Sign    Days of Exercise per Week: 0 days    Minutes of Exercise per Session: 0 min  Stress: Stress Concern Present (03/01/2019)   [place]    Feeling of Stress : Rather much  Social Connections: Not on file   Past Surgical History:  Procedure Laterality Date   APPLICATION OF A-CELL OF BACK N/A 08/06/2018   Procedure: Application Of A-Cell Of Back;  Surgeon: [name], DO;  Location: [place];  Service: Plastics;  Laterality: N/A;   APPLICATION OF A-CELL OF EXTREMITY Left 08/06/2018   Procedure: Application Of A-Cell Of Extremity;  Surgeon: [name], DO;  Location: [place];  Service: Plastics;  Laterality: Left;   APPLICATION OF A-CELL OF EXTREMITY Left 09/18/2019   Procedure: APPLICATION OF A-CELL OF EXTREMITY;  Surgeon: [name], DO;  Location: [place];  Service: Plastics;  Laterality: Left;   APPLICATION OF WOUND VAC  07/12/2018   Procedure: Application Of Wound Vac to the Left Thigh and Scrotum.;  Surgeon: Shona Needles, MD;  Location: [place];  Service: Orthopedics;;   APPLICATION OF WOUND VAC  07/10/2018   Procedure: Application Of Wound Vac;  Surgeon: Clovis Riley, MD;  Location: [place];  Service:  General;;   COLON SURGERY  2020   colostomy   COLOSTOMY N/A 07/23/2018   Procedure: COLOSTOMY;  Surgeon: Georganna Skeans, MD;  Location: [place];  Service: General;  Laterality: N/A;   CYSTOSCOPY W/ URETERAL STENT PLACEMENT N/A 07/15/2018   Procedure: RETROGRADE URETHROGRAM;  Surgeon: Franchot Gallo, MD;  Location: [place];  Service: Urology;  Laterality: N/A;   CYSTOSCOPY WITH LITHOLAPAXY N/A 05/06/2019   Procedure: CYSTOSCOPY BASKET BLADDER STONE EXTRACTION;  Surgeon: Cleon Gustin, MD;  Location: [facility];  Service: Urology;  Laterality: N/A;  [place] N/A 05/06/2019   Procedure: REPLACEMENT OF SUPRAPUBIC CATHETER;  Surgeon: Cleon Gustin, MD;  Location: [facility];  Service: Urology;  Laterality: N/A;   DEBRIDEMENT AND CLOSURE WOUND Left 03/04/2019   Procedure: Excision of hip wound with placement of [place];  Surgeon: [name], DO;  Location: [place];  Service: Plastics;  Laterality: Left;   ESOPHAGOGASTRODUODENOSCOPY N/A 08/14/2018   Procedure: ESOPHAGOGASTRODUODENOSCOPY (EGD);  Surgeon: Georganna Skeans, MD;  Location: [place];  Service: General;  Laterality: N/A;  bedside   FACIAL RECONSTRUCTION SURGERY     X 2--once as a teenager and second time in his 4's   HARDWARE REMOVAL Left 03/04/2019   Procedure: Left Hip Hardware Removal;  Surgeon: Shona Needles, MD;  Location: [place];  Service: Orthopedics;  Laterality: Left;   HIP PINNING,CANNULATED Left 07/12/2018   Procedure: CANNULATED HIP PINNING;  Surgeon: Shona Needles, MD;  Location: [place];  Service: Orthopedics;  Laterality: Left;   HIP SURGERY     HOLMIUM LASER APPLICATION Right [id number]   Procedure: HOLMIUM LASER APPLICATION;  Surgeon: Cleon Gustin, MD;  Location: [facility];  Service: Urology;  Laterality: Right;   I & D EXTREMITY Left 07/25/2018   Procedure: Debridement of buttock, scrotum and left leg, placement of acell and vac;  Surgeon:  [name], DO;  Location: [place];  Service: Plastics;  Laterality: Left;   I & D EXTREMITY N/A 08/06/2018   Procedure: Debridement of buttock, scrotum and left leg;  Surgeon: [name], DO;  Location: [place];  Service: Plastics;  Laterality: N/A;   I & D EXTREMITY N/A 08/13/2018   Procedure: Debridement of buttock, scrotum and left leg, placement of acell and vac;  Surgeon: [name], DO;  Location: [place];  Service: Plastics;  Laterality: N/A;  90 min, please   INCISION AND DRAINAGE HIP Left 09/18/2019   Procedure: IRRIGATION AND DEBRIDEMENT HIP/ PELVIS WITH WOUND VAC PLACEMENT;  Surgeon: Shona Needles, MD;  Location: [place];  Service: Orthopedics;  Laterality: Left;   INCISION AND DRAINAGE OF WOUND N/A 07/18/2018   Procedure: Debridement of left leg, buttocks and scrotal wound with placement of acell and Flexiseal;  Surgeon: [name], DO;  Location: [place];  Service: Plastics;  Laterality: N/A;   INCISION AND DRAINAGE OF WOUND Left 08/29/2018   Procedure: Debridement of buttock, scrotum and left leg, placement of acell and vac;  Surgeon: [name], DO;  Location: [place];  Service: Plastics;  Laterality: Left;  75 min, please   INCISION AND DRAINAGE OF WOUND Bilateral 10/23/2018   Procedure: DEBRIDEMENT OF BUTTOCK,SCROTUM, AND LEG WOUNDS WITH PLACEMENT OF ACELL- BILATERAL 90 MIN;  Surgeon: [name], DO;  Location: [place];  Service: Plastics;  Laterality: Bilateral;   IR ANGIOGRAM PELVIS SELECTIVE OR SUPRASELECTIVE  07/10/2018   IR ANGIOGRAM PELVIS SELECTIVE OR SUPRASELECTIVE  07/10/2018   IR ANGIOGRAM SELECTIVE EACH ADDITIONAL VESSEL  07/10/2018   IR EMBO ART  VEN HEMORR LYMPH EXTRAV  INC GUIDE ROADMAPPING  07/10/2018   IR GASTROSTOMY TUBE MOD SED  01/21/2021   IR NEPHROSTOMY PLACEMENT LEFT  04/05/2019   IR NEPHROSTOMY PLACEMENT RIGHT  05/31/2019   IR US GUIDE BX ASP/DRAIN  07/10/2018   IR US GUIDE VASC ACCESS RIGHT  07/10/2018   IR VENO/EXT/UNI  LEFT  07/10/2018   IRRIGATION AND DEBRIDEMENT OF WOUND WITH SPLIT THICKNESS SKIN GRAFT Left 09/19/2018  Procedure: Debridement of gluteal wound with placement of acell to left leg.;  Surgeon: [name], DO;  Location: [place];  Service: Plastics;  Laterality: Left;  2.5 hours, please   LAPAROTOMY N/A 07/12/2018   Procedure: EXPLORATORY LAPAROTOMY;  Surgeon: Georganna Skeans, MD;  Location: [place];  Service: General;  Laterality: N/A;   LAPAROTOMY N/A 07/15/2018   Procedure: WOUND EXPLORATION; CLOSURE OF ABDOMEN;  Surgeon: Georganna Skeans, MD;  Location: [place];  Service: General;  Laterality: N/A;   LAPAROTOMY  07/10/2018   Procedure: Exploratory Laparotomy;  Surgeon: Clovis Riley, MD;  Location: [place];  Service: General;;   MASS EXCISION Left 09/18/2019   Procedure: EXCISION UPPER LEFT INNER THIGH WOUND;  Surgeon: [name], DO;  Location: [place];  Service: Plastics;  Laterality: Left;   NEPHROLITHOTOMY Right 07/15/2019   Procedure: NEPHROLITHOTOMY PERCUTANEOUS;  Surgeon: Cleon Gustin, MD;  Location: [facility];  Service: Urology;  Laterality: Right;  90 MINS   PEG PLACEMENT N/A 08/14/2018   Procedure: PERCUTANEOUS ENDOSCOPIC GASTROSTOMY (PEG) PLACEMENT;  Surgeon: Georganna Skeans, MD;  Location: [place];  Service: General;  Laterality: N/A;   PERCUTANEOUS TRACHEOSTOMY N/A 08/02/2018   Procedure: PERCUTANEOUS TRACHEOSTOMY;  Surgeon: Georganna Skeans, MD;  Location: [place];  Service: General;  Laterality: N/A;   RADIOLOGY WITH ANESTHESIA N/A 07/10/2018   Procedure: IR WITH ANESTHESIA;  Surgeon: Sandi Mariscal, MD;  Location: [place];  Service: Radiology;  Laterality: N/A;   RADIOLOGY WITH ANESTHESIA Right 07/10/2018   Procedure: Ir With Anesthesia;  Surgeon: Sandi Mariscal, MD;  Location: [place];  Service: Radiology;  Laterality: Right;   SCROTAL EXPLORATION N/A 07/15/2018   Procedure: SCROTUM DEBRIDEMENT;  Surgeon: Franchot Gallo, MD;  Location: [place];  Service:  Urology;  Laterality: N/A;   SHOULDER SURGERY     SKIN SPLIT GRAFT Right 09/19/2018   Procedure: Skin Graft Split Thickness;  Surgeon: [name], DO;  Location: [place];  Service: Plastics;  Laterality: Right;   SKIN SPLIT GRAFT N/A 10/03/2018   Procedure: Split thickness skin graft to gluteal area with acell placement;  Surgeon: [name], DO;  Location: [place];  Service: Plastics;  Laterality: N/A;  3 hours, please   VACUUM ASSISTED CLOSURE CHANGE N/A 07/12/2018   Procedure: ABDOMINAL VACUUM ASSISTED CLOSURE CHANGE and abdominal washout;  Surgeon: Georganna Skeans, MD;  Location: [place];  Service: General;  Laterality: N/A;   WOUND DEBRIDEMENT Left 07/23/2018   Procedure: DEBRIDEMENT LEFT BUTTOCK  WOUND;  Surgeon: Georganna Skeans, MD;  Location: [place];  Service: General;  Laterality: Left;   WOUND EXPLORATION Left 07/10/2018   Procedure: WOUND EXPLORATION LEFT GROIN;  Surgeon: Clovis Riley, MD;  Location: [facility];  Service: General;  Laterality: Left;   Past Medical History:  Diagnosis Date   Acute on chronic respiratory failure with hypoxia ([place]) 06/2018   trach removed 11-16-2018, on vent from [place] until may 2020 - uses albuterol prn   Anxiety    Bacteremia due to Pseudomonas 06/2018   Chronic osteomyelitis ([place])    Chronic pain syndrome    Clostridium difficile colitis 10/30/2019   tx with abx    Depression    DVT (deep venous thrombosis) ([place]) 2020   right brachial post PICC line   History of blood transfusion 06/2018   History of Clostridioides difficile colitis    History of kidney stones    Hypertension    norvasc d/c by pcp on 11/05/19  Multiple traumatic injuries    Penile pain 11/18/2019   Pneumonia 11/2009   2020 x 2   Walker as ambulation aid    Wheelchair bound    electric   Wound discharge    left hip wound with bloody/clear drainage change dressing q day surgilube with gauze, between legs wound using calcium algenate pad bid   BP (!) 154/90   Pulse  67   Ht '6\' 3"'$  (1.905 m)   SpO2 93%   BMI 22.50 kg/m   Opioid Risk Score:   Fall Risk Score:  `1  Depression screen [facility] 2/9     08/17/2022    9:21 AM 07/25/2022   10:34 AM 06/17/2022   12:54 PM 03/31/2022   12:36 PM 01/12/2022   11:17 AM 11/17/2021   11:30 AM 10/06/2021   11:28 AM  Depression screen PHQ 2/9  Decreased Interest 1 0 0  0 1 1  Down, Depressed, Hopeless 1 0 0 '3 1 1 1  '$ PHQ - 2 Score 2 0 0 '3 1 2 2  '$ Altered sleeping    3     Tired, decreased energy    2     Change in appetite    3     Feeling bad or failure about yourself     3     Trouble concentrating    3     Moving slowly or fidgety/restless    2     Suicidal thoughts    0     PHQ-9 Score    19     Difficult doing work/chores    Very difficult       Review of Systems  Musculoskeletal:        Spasms  Neurological:  Positive for tremors, weakness, numbness and headaches.  Psychiatric/Behavioral:  Positive for confusion.        Depression  All other systems reviewed and are negative.      Objective:   Physical Exam Awake, alert, brighter affect today, but still depressed appearing, in power w/c; drinking water a lot, NAD Wife here at bedside as well Wearing tennis elbow splints. And driving gloves- "thinks looks cool" Poor memory- vague in details-  Cannot remember details of conversation that has with wife/worker's comp Trace LE edema B/L  (was "huge prior" per wife)        Assessment & Plan:   Pt is a 57 yr old male with hx of Multitrauma- causing L femoral neck fx, degloving of L hip to going/scrotum, bladder neck trauma- got SPC, developed compartment syndrome -s/p surgery for that; also diverting colostomy, skin grafts, and s/p trach and PEG- PEG is out. 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 S/P screw removal and on IV ABX for L hip osteomyelitis. Has leg length discrepancy- R side is longer-  hx of kidney stone and new RUE DVT on  Eliquis and Cdiff on PO Vanc .  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  Has R severe ulnar neuropathy as well. O2 3L full time.   Here for f/u on multitrauma and chronic L hip osteomyelitis with associated chronic pain.   Pt also has poor memory and  irritability -more c/w anoxia- which could have occurred when had so many surgeries for L hip/trauma, etc.   Will try Imipramine if Gemtesa doesn't work- 1/2 pill 3x/day x 3-4 days, then if tolerated, increase to 1 pill 3x/day. Try Gemtesa for a total of 4 weeks before trying Imipramine.   2.  Speak to psychiatry about irritability and short fuse of temper- see if they have any ideas. We will get him testing for memory/irritability   3.  Will make appointment to get into neuropsychological testing for behavior/cognition- more indicative of anoxia that I can see to do testing, than MRI/brain scans. Placing referral to neuropsychology.  Will want to see the "real you" during testing.  Gave Rx for referral to Worker's comp  4. Refill Duragesic 75 mg q2 days #15- con't regimen  5. Refill MSIR but reduce amount of pills from 240 to 200 pills/month since reducing amount taken- 15 mg q3 hours prn  6. Has appointment with general surgery due to leakage from feeding tube site- wondering if has fistula- in the next 2 weeks.    7. Discussed everything with Worker's Designer, multimedia as well   8. F/U in 6 weeks- double appointment-    I spent a total of   42  minutes on total care today- >50% coordination of care- due to education on possible anoxia and need for neuropsych; and d/w pt/wife about bladder spasms and d/w worker's comp.

## 2022-08-17 NOTE — Patient Instructions (Signed)
Pt is a 57 yr old male with hx of Multitrauma- causing L femoral neck fx, degloving of L hip to going/scrotum, bladder neck trauma- got SPC, developed compartment syndrome -s/p surgery for that; also diverting colostomy, skin grafts, and s/p trach and PEG- PEG is out. 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 S/P screw removal and on IV ABX for L hip osteomyelitis. 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  Has R severe ulnar neuropathy as well. O2 3L full time.   Here for f/u on multitrauma and chronic L hip osteomyelitis with associated chronic pain.   Pt also has poor memory and irritability -more c/w anoxia- which could have occurred when had so many surgeries for L hip/trauma, etc.   Will try Imipramine if Gemtesa doesn't work- 1/2 pill 3x/day x 3-4 days, then if tolerated, increase to 1 pill 3x/day. Try Gemtesa for a total of 4 weeks before trying Imipramine.   2.  Speak to psychiatry about irritability and short fuse of temper- see if they have any ideas. We will get him testing for memory/irritability   3.  Will make appointment to get into neuropsychological testing for behavior/cognition- more indicative of anoxia that I can see to do testing, than MRI/brain scans. Placing referral to neuropsychology.  Will want to see the "real you" during testing.    4. Refill Duragesic 75 mg q2 days #15- con't regimen  5. Refill MSIR but reduce amount of pills from 240 to 200 pills/month since reducing amount taken- 15 mg q3 hours prn  6. Has appointment  with general surgery due to leakage from feeding tube site- wondering if has fistula- in the next 2 weeks.    7. Discussed everything with Worker's Designer, multimedia as well   8. F/U in 6 weeks- double appointment-

## 2022-08-19 ENCOUNTER — Ambulatory Visit: Payer: Self-pay | Admitting: Internal Medicine

## 2022-08-21 ENCOUNTER — Other Ambulatory Visit: Payer: Self-pay | Admitting: Internal Medicine

## 2022-08-21 DIAGNOSIS — R0602 Shortness of breath: Secondary | ICD-10-CM

## 2022-08-22 NOTE — Telephone Encounter (Signed)
Form Faxed to [phone] Form placed to be scan in pt chart

## 2022-08-25 ENCOUNTER — Other Ambulatory Visit: Payer: Self-pay | Admitting: Family Medicine

## 2022-08-30 ENCOUNTER — Other Ambulatory Visit: Payer: Self-pay | Admitting: Family Medicine

## 2022-09-04 ENCOUNTER — Other Ambulatory Visit: Payer: Self-pay | Admitting: Physical Medicine and Rehabilitation

## 2022-09-06 ENCOUNTER — Other Ambulatory Visit: Payer: Self-pay | Admitting: Family Medicine

## 2022-09-06 ENCOUNTER — Encounter: Payer: Self-pay | Admitting: Internal Medicine

## 2022-09-06 ENCOUNTER — Ambulatory Visit (INDEPENDENT_AMBULATORY_CARE_PROVIDER_SITE_OTHER): Payer: PRIVATE HEALTH INSURANCE | Admitting: Internal Medicine

## 2022-09-06 VITALS — BP 138/90 | HR 76 | Temp 98.2°F | Ht 75.0 in | Wt 204.0 lb

## 2022-09-06 DIAGNOSIS — J4489 Other specified chronic obstructive pulmonary disease: Secondary | ICD-10-CM

## 2022-09-06 DIAGNOSIS — J439 Emphysema, unspecified: Secondary | ICD-10-CM

## 2022-09-06 DIAGNOSIS — Z9189 Other specified personal risk factors, not elsewhere classified: Secondary | ICD-10-CM

## 2022-09-06 MED ORDER — BREZTRI AEROSPHERE 160-9-4.8 MCG/ACT IN AERO: 2.0000 | INHALATION_SPRAY | Freq: Two times a day (BID) | RESPIRATORY_TRACT | 5 refills | Status: DC
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
## 2022-09-06 NOTE — Patient Instructions (Signed)
Please schedule follow up scheduled with myself in 6 months.  If my schedule is not open yet, we will contact you with a reminder closer to that time. Please call [phone] if you haven't heard from [place] a month before.   Stop anoro inhaler. Switch to breztri 2 puffs in the morning, two puffs at night.  I will inquire about a sleep study for you.

## 2022-09-07 ENCOUNTER — Telehealth: Payer: Self-pay | Admitting: *Deleted

## 2022-09-07 NOTE — Telephone Encounter (Signed)
Key: [id number]) Ventolin HFA 108 (90 Base)MCG/ACT aerosol Status: Sent To PlanCreated: March 27th, 2024Sent: March 27th, 2024 Waiting for determination

## 2022-09-12 ENCOUNTER — Other Ambulatory Visit: Payer: Self-pay | Admitting: Physical Medicine and Rehabilitation

## 2022-09-13 ENCOUNTER — Other Ambulatory Visit: Payer: Self-pay | Admitting: Physical Medicine and Rehabilitation

## 2022-09-13 MED ORDER — IMIPRAMINE HCL 25 MG PO TABS: 25.0000 mg | ORAL_TABLET | Freq: Three times a day (TID) | ORAL | 1 refills | Status: DC

## 2022-09-14 ENCOUNTER — Other Ambulatory Visit: Payer: Self-pay | Admitting: Physical Medicine and Rehabilitation

## 2022-09-19 ENCOUNTER — Other Ambulatory Visit: Payer: Self-pay | Admitting: Family

## 2022-09-19 ENCOUNTER — Other Ambulatory Visit: Payer: Self-pay | Admitting: *Deleted

## 2022-09-19 ENCOUNTER — Other Ambulatory Visit: Payer: Self-pay

## 2022-09-19 DIAGNOSIS — M8668 Other chronic osteomyelitis, other site: Secondary | ICD-10-CM

## 2022-09-19 MED ORDER — CIPROFLOXACIN HCL 500 MG PO TABS: 500.0000 mg | ORAL_TABLET | Freq: Two times a day (BID) | ORAL | 5 refills | Status: DC

## 2022-09-19 NOTE — Telephone Encounter (Signed)
Spoke with [facility]ist at [facility] in [place] regarding DDI with addition of Seroquel for possible QT elongation. Informed that Mr. Khuong Criscione come off his ciprofloxacin secondary to his Pseudomonas infection therefore will need to continue Ciprofloxacin as prescribed with oral vancomycin for C. Diff prevention indefinitely.   Marcos Eke, NP 09/19/2022 11:00 AM

## 2022-09-19 NOTE — Telephone Encounter (Signed)
Last script pharmacy received was 11/2022. It appears the one on 06/23/22 was never received.

## 2022-09-19 NOTE — Telephone Encounter (Signed)
Spoke with [facility] at [facility] and informed that Mr. Christopher [name] come of ciprofloxacin secondary to Pseduomonas infection.

## 2022-09-19 NOTE — Telephone Encounter (Signed)
Please advise 

## 2022-09-20 MED ORDER — PAROXETINE HCL 40 MG PO TABS: ORAL_TABLET | ORAL | 2 refills | Status: DC

## 2022-09-21 ENCOUNTER — Other Ambulatory Visit: Payer: Self-pay | Admitting: *Deleted

## 2022-09-21 MED ORDER — PAROXETINE HCL 40 MG PO TABS: ORAL_TABLET | ORAL | 2 refills | Status: DC

## 2022-09-21 NOTE — Telephone Encounter (Signed)
Confirmed with pharmacy that refill submitted on yesterday did not come through. Can you please resubmit?

## 2022-09-26 ENCOUNTER — Telehealth: Payer: Self-pay

## 2022-09-26 MED ORDER — FENTANYL 75 MCG/HR TD PT72: 1.0000 | MEDICATED_PATCH | TRANSDERMAL | 0 refills | Status: DC

## 2022-09-26 MED ORDER — MORPHINE SULFATE 15 MG PO TABS: 15.0000 mg | ORAL_TABLET | ORAL | 0 refills | Status: DC | PRN

## 2022-09-26 NOTE — Telephone Encounter (Signed)
Patient requesting refill on fentanyl and morphine last fills per pmp   08/24/2022 08/17/2022 1  Fentanyl 75 Mcg/hr Patch 15.00 30 Me Lov [id number] Nor ([id number]) 0/0 270.00 MME Worker[facility] [place] 08/24/2022 08/17/2022 1  Morphine Sulfate Ir 15 Mg Tab 200.00 25 Me Lov [id number] Nor ([id number]) 0/0 120.00 MME Worker's Comp [place]

## 2022-09-28 ENCOUNTER — Ambulatory Visit: Payer: PRIVATE HEALTH INSURANCE | Admitting: Physical Medicine and Rehabilitation

## 2022-09-28 ENCOUNTER — Encounter (HOSPITAL_BASED_OUTPATIENT_CLINIC_OR_DEPARTMENT_OTHER): Payer: PRIVATE HEALTH INSURANCE | Admitting: Internal Medicine

## 2022-09-30 ENCOUNTER — Ambulatory Visit (INDEPENDENT_AMBULATORY_CARE_PROVIDER_SITE_OTHER): Payer: PRIVATE HEALTH INSURANCE | Admitting: Family Medicine

## 2022-09-30 ENCOUNTER — Other Ambulatory Visit: Payer: Self-pay

## 2022-09-30 ENCOUNTER — Encounter: Payer: Self-pay | Admitting: Family Medicine

## 2022-09-30 VITALS — BP 164/108 | HR 96 | Temp 98.0°F | Resp 16 | Ht 75.0 in | Wt 207.0 lb

## 2022-09-30 DIAGNOSIS — R600 Localized edema: Secondary | ICD-10-CM | POA: Insufficient documentation

## 2022-09-30 DIAGNOSIS — I10 Essential (primary) hypertension: Secondary | ICD-10-CM

## 2022-09-30 DIAGNOSIS — R030 Elevated blood-pressure reading, without diagnosis of hypertension: Secondary | ICD-10-CM

## 2022-09-30 MED ORDER — FUROSEMIDE 40 MG PO TABS: 40.0000 mg | ORAL_TABLET | Freq: Every day | ORAL | 1 refills | Status: DC

## 2022-09-30 MED ORDER — HYDROCHLOROTHIAZIDE 12.5 MG PO TABS: 12.5000 mg | ORAL_TABLET | Freq: Every day | ORAL | 3 refills | Status: DC

## 2022-09-30 NOTE — Assessment & Plan Note (Signed)
Much better with Lasix.  His recent DVT scan was negative.  He has having some orthopnea which may be due to his rapid increase in abdominal obesity due to his psychiatric medications however we need to assess EF to rule out CHF.  We will order echocardiogram today.  His lungs today are clear without any signs of volume overload.  Will continue Lasix 40 mg daily.  He will come back in a week or 2 to recheck labs.  Will also be adding on HCTZ 12.5 mg daily as above for blood pressure control which should help with some of his lower extremity edema as well.  We discussed importance of continuing with leg elevation and salt avoidance.

## 2022-09-30 NOTE — Progress Notes (Signed)
Christopher [name] is a 57 y.o. male who presents today for an office visit.  Assessment/Plan:  New/Acute Problems: Gastrocutaneous fistula Stable.  Will be following up with GI for further management.  Chronic Problems Addressed Today: Essential hypertension Initially elevated to 184/105 but had improved to 164/108 on recheck.  He has been elevated for the last several weeks at home and at other doctors offices as well.  Would be reasonable to start antihypertensives at this point.  He has been off of them for the last several years.  He has recently been started on Zyprexa and has had quite a bit of weight gain which is the most likely reason for his recent elevation.  Due to his issues with the lower extremity swelling we will start HCTZ 12.5 mg daily.  We discussed side effects.  They will come back in a week or 2 for blood pressure and lab recheck.  Leg edema Much better with Lasix.  His recent DVT scan was negative.  He has having some orthopnea which may be due to his rapid increase in abdominal obesity due to his psychiatric medications however we need to assess EF to rule out CHF.  We will order echocardiogram today.  His lungs today are clear without any signs of volume overload.  Will continue Lasix 40 mg daily.  He will come back in a week or 2 to recheck labs.  Will also be adding on HCTZ 12.5 mg daily as above for blood pressure control which should help with some of his lower extremity edema as well.  We discussed importance of continuing with leg elevation and salt avoidance.     Subjective:  HPI:  See A/P for status of chronic conditions.  Patient was here 2 months ago.  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  We referred him to see surgery for this.  He saw the surgeon about a month ago and they advised him to follow back up with GI to consider an scopic clip closure of the gastrocutaneous fistula.  They have  not yet been able to schedule this appointment.  His last visit he was also having some issues with lower extremity swelling.  Due to his debility and chronic osteomyelitis there was concern for possible DVT and we checked a lower extremity Doppler.  This was negative for DVT.  We initially recommended conservative management including leg elevation and salt avoidance.  They tried this however still had persistent symptoms.  We started him on Lasix 40 mg daily with significant improvement in his lower extremity swelling.  He is doing well with this without any significant side effects.  They have noted that he does feel more anxious and short of breath when lying flat on his back.  He has gained quite a bit of weight recently due to his psychiatric medications and they are not sure if this is contributing.  They have also noted that his blood pressure has been elevated for the last couple of months.  It was mildly elevated at our last office visit however had previously been very well-controlled before hand.  His home readings have been elevated as well.  He has previously been on amlodipine several years ago however this was discontinued due to no longer needing it.       Objective:  Physical Exam: BP (!) 164/108   Pulse 96   Temp 98 F (36.7 C) (Temporal)  Resp 16   Ht  (1.905 m)   Wt 207 lb (93.9 kg)   SpO2 95%   BMI 25.87 kg/m   Gen: No acute distress, resting comfortably CV: Regular rate and rhythm with no murmurs appreciated Pulm: Normal work of breathing, clear to auscultation bilaterally with no crackles, wheezes, or rhonchi Neuro: In wheelchair.  Psych: Normal affect and thought content      Berma Harts M. [name], MD 09/30/2022 11:53 AM

## 2022-09-30 NOTE — Patient Instructions (Signed)
It was very nice to see you today!  We need to work on getting your blood pressure lower.  Please start the HCTZ 12.5 mg daily.  Please continue the Lasix 40 mg daily.  Please keep an eye on your blood pressure daily and come back to see [place] in a couple of weeks to recheck.  Will check blood work at this time also.  We will order an echocardiogram to further assess your heart function.  Please let us know if you have not heard from them by next week.  No follow-ups on file.   Take care, Dr [name]  PLEASE NOTE:  If you had any lab tests, please let us know if you have not heard back within a few days. You may see your results on mychart before we have a chance to review them but we will give you a call once they are reviewed by [place].   If we ordered any referrals today, please let us know if you have not heard from their office within the next week.   If you had any urgent prescriptions sent in today, please check with the pharmacy within an hour of our visit to make sure the prescription was transmitted appropriately.   Please try these tips to maintain a healthy lifestyle:  Eat at least 3 REAL meals and 1-2 snacks per day.  Aim for no more than 5 hours between eating.  If you eat breakfast, please do so within one hour of getting up.   Each meal should contain half fruits/vegetables, one quarter protein, and one quarter carbs (no bigger than a computer mouse)  Cut down on sweet beverages. This includes juice, soda, and sweet tea.   Drink at least 1 glass of water with each meal and aim for at least 8 glasses per day  Exercise at least 150 minutes every week.

## 2022-09-30 NOTE — Assessment & Plan Note (Signed)
Initially elevated to 184/105 but had improved to 164/108 on recheck.  He has been elevated for the last several weeks at home and at other doctors offices as well.  Would be reasonable to start antihypertensives at this point.  He has been off of them for the last several years.  He has recently been started on Zyprexa and has had quite a bit of weight gain which is the most likely reason for his recent elevation.  Due to his issues with the lower extremity swelling we will start HCTZ 12.5 mg daily.  We discussed side effects.  They will come back in a week or 2 for blood pressure and lab recheck.

## 2022-10-03 ENCOUNTER — Telehealth: Payer: Self-pay | Admitting: Gastroenterology

## 2022-10-03 ENCOUNTER — Encounter: Payer: Self-pay | Admitting: Physical Medicine and Rehabilitation

## 2022-10-03 ENCOUNTER — Encounter
Payer: No Typology Code available for payment source | Attending: Physical Medicine and Rehabilitation | Admitting: Physical Medicine and Rehabilitation

## 2022-10-03 VITALS — BP 155/95 | HR 108

## 2022-10-03 DIAGNOSIS — Z9359 Other cystostomy status: Secondary | ICD-10-CM | POA: Diagnosis present

## 2022-10-03 DIAGNOSIS — N3289 Other specified disorders of bladder: Secondary | ICD-10-CM | POA: Diagnosis present

## 2022-10-03 DIAGNOSIS — K9400 Colostomy complication, unspecified: Secondary | ICD-10-CM | POA: Diagnosis not present

## 2022-10-03 DIAGNOSIS — S3730XS Unspecified injury of urethra, sequela: Secondary | ICD-10-CM

## 2022-10-03 DIAGNOSIS — G8921 Chronic pain due to trauma: Secondary | ICD-10-CM | POA: Diagnosis not present

## 2022-10-03 DIAGNOSIS — F064 Anxiety 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: Secondary | ICD-10-CM

## 2022-10-03 DIAGNOSIS — M8668 Other chronic osteomyelitis, other site: Secondary | ICD-10-CM | POA: Diagnosis not present

## 2022-10-03 MED ORDER — FENTANYL 75 MCG/HR TD PT72: 1.0000 | MEDICATED_PATCH | TRANSDERMAL | 0 refills | Status: DC

## 2022-10-03 MED ORDER — MORPHINE SULFATE 15 MG PO TABS: 15.0000 mg | ORAL_TABLET | ORAL | 0 refills | Status: DC | PRN
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
## 2022-10-03 NOTE — Progress Notes (Signed)
Subjective:    Patient ID: Christopher [name], male    DOB: 1965/09/07, 57 y.o.   MRN: [id number]  HPI Pt is a 57 yr old male with hx of Multitrauma- causing L femoral neck fx, degloving of L hip to going/scrotum, bladder neck trauma- got SPC, developed compartment syndrome -s/p surgery for that; also diverting colostomy, skin grafts, and s/p trach and PEG- PEG is out. 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 S/P screw removal and on IV ABX for L hip osteomyelitis. 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  Has R severe ulnar neuropathy as well. O2 3L full time.   Here for f/u on multitrauma and chronic L hip osteomyelitis with associated chronic pain.     A few issues Went to Dr [name] this past Friday- still retaining fluid- stopped Lasix and restarted Lasix- ordered a new BP pressure- HCTZ but didn't get Rx for Potassium.   On keflex for UTI  Stopped Gemtesa- needs refill of Imipramine and then started back Gemtesa to try and see if helps.    Pain about the same- overall, but having more  Pain in his bladder-   Sent for referral for ECHO- due to heart and swelling.  Still waiting on GI doctor- to fix fistula- waiting on worker's comp to give approval-   More SOB has increased- due to fluid, most likely.   Not taking Seroquel, Benzo's- or Abilify.    Bladder hurting so bad- having bladder spasms- it's interfering with sleep.   And freaking out because cannot sleep and then spasming/hurting.    Pain Inventory Average Pain 9 Pain  Right Now 9 My pain is sharp, burning, dull, stabbing, tingling, and aching  LOCATION OF PAIN  back, groin, hip  BOWEL Number of stools per week: . Oral laxative use Yes  Type of laxative Linezz Enema or suppository use No  History of colostomy Yes  Incontinent No   BLADDER Suprapubic In and out cath, frequency monthly/prn Able to self cath  . Bladder incontinence No  Frequent urination No  Leakage with coughing No  Difficulty starting stream No  Incomplete bladder emptying No    Mobility how many minutes can you walk? 0 ability to climb steps?  no do you drive?  no use a wheelchair needs help with transfers  Function disabled: date disabled . I need assistance with the following:  dressing, bathing, toileting, meal prep, household duties, and shopping  Neuro/Psych weakness numbness tremor tingling trouble walking spasms dizziness confusion depression anxiety  Prior Studies ultrasound  Physicians involved in your care Any changes since last visit?  no   Family History  Problem Relation Age of Onset   Breast cancer Mother        with mets to the bones   Social History   Socioeconomic History   Marital status: Married    Spouse name: Not on file   Number of children: Not on file   Years of education: Not on file   Highest education level: Not on  file  Occupational History   Occupation: Disable  Tobacco Use   Smoking status: Former    Packs/day: 1.00    Years: 20.00    Additional pack years: 0.00    Total pack years: 20.00    Types: Cigarettes    Quit date: 07/10/2018    Years since quitting: 4.2   Smokeless tobacco: Never  Vaping Use   Vaping Use: Never used  Substance and Sexual Activity   Alcohol use: Never   Drug use: Yes    Types: Oxycodone, Fentanyl    Comment: Fentanyl patch/oxycodone since 06/2018   Sexual activity: Yes  Other Topics Concern   Not on file  Social History Narrative   ** Merged History Encounter **        Social Determinants of Health   Financial Resource Strain: Low Risk  (03/01/2019)   Overall Financial Resource Strain (CARDIA)    Difficulty of Paying Living Expenses: Not very hard  Food Insecurity: Food Insecurity Present (03/01/2019)   Hunger Vital Sign    Worried About Running Out of Food in the Last Year: Sometimes true    Ran Out of Food in the Last Year: Sometimes true  Transportation Needs: No Transportation Needs (03/01/2019)   PRAPARE - Administrator, Civil Service (Medical): No    Lack of Transportation (Non-Medical): No  Physical Activity: Inactive (03/01/2019)   Exercise Vital Sign    Days of Exercise per Week: 0 days    Minutes of Exercise per Session: 0 min  Stress: Stress Concern Present (03/01/2019)   [employer] of Occupational Health - Occupational Stress Questionnaire    Feeling of Stress : Rather much  Social Connections: Not on file   Past Surgical History:  Procedure Laterality Date   APPLICATION OF A-CELL OF BACK N/A 08/06/2018   Procedure: Application Of A-Cell Of Back;  Surgeon: Peggye Form, DO;  [facility];  Service: Plastics;  Laterality: N/A;   APPLICATION OF A-CELL OF EXTREMITY Left 08/06/2018   Procedure: Application Of A-Cell Of Extremity;  Surgeon: Peggye Form, DO;  [facility];  Service: Plastics;  Laterality: Left;   APPLICATION OF A-CELL OF EXTREMITY Left 09/18/2019   Procedure: APPLICATION OF A-CELL OF EXTREMITY;  Surgeon: Peggye Form, DO;  [facility];  Service: Plastics;  Laterality: Left;   APPLICATION OF WOUND VAC  07/12/2018   Procedure: Application Of Wound Vac to the Left Thigh and Scrotum.;  Surgeon: Roby Lofts, MD;  [facility];  Service: Orthopedics;;   APPLICATION OF WOUND VAC  07/10/2018   Procedure: Application Of Wound Vac;  Surgeon: Berna Bue, MD;  Location[facility] OR;  Service: General;;   COLON SURGERY  2020   colostomy   COLOSTOMY N/A 07/23/2018   Procedure:  COLOSTOMY;  Surgeon: Violeta Gelinas, MD;  Location: [facility] OR;  Service: General;  Laterality: N/A;   CYSTOSCOPY W/ URETERAL STENT PLACEMENT N/A 07/15/2018   Procedure: RETROGRADE URETHROGRAM;  Surgeon: Marcine Matar, MD;  Location: [facility] OR;  Service: Urology;  Laterality: N/A;   CYSTOSCOPY WITH LITHOLAPAXY N/A 05/06/2019   Procedure: CYSTOSCOPY BASKET BLADDER STONE EXTRACTION;  Surgeon: Malen Gauze, MD;  Location: [facility];  Service: Urology;  Laterality: N/A;  30 MINS   CYSTOSTOMY N/A 05/06/2019   Procedure: REPLACEMENT OF SUPRAPUBIC CATHETER;  Surgeon: Malen Gauze, MD;  Location: [facility];  Service: Urology;  Laterality: N/A;   DEBRIDEMENT AND CLOSURE WOUND Left  03/04/2019   Procedure: Excision of hip wound with placement of Acell;  Surgeon: Peggye Form, DO;  [facility];  Service: Plastics;  Laterality: Left;   ESOPHAGOGASTRODUODENOSCOPY N/A 08/14/2018   Procedure: ESOPHAGOGASTRODUODENOSCOPY (EGD);  Surgeon: Violeta Gelinas, MD;  Location: [facility] ENDOSCOPY;  Service: General;  Laterality: N/A;  bedside   FACIAL RECONSTRUCTION SURGERY     X 2--once as a teenager and second time in his 30's   HARDWARE REMOVAL Left 03/04/2019   Procedure: Left Hip Hardware Removal;  Surgeon: Roby Lofts, MD;  [facility];  Service: Orthopedics;  Laterality: Left;   HIP PINNING,CANNULATED Left 07/12/2018   Procedure: CANNULATED HIP PINNING;  Surgeon: Roby Lofts, MD;  [facility];  Service: Orthopedics;  Laterality: Left;   HIP SURGERY     HOLMIUM LASER APPLICATION Right 07/15/2019   Procedure: HOLMIUM LASER APPLICATION;  Surgeon: Malen Gauze, MD;  Location: [facility];  Service: Urology;  Laterality: Right;   I & D EXTREMITY Left 07/25/2018   Procedure: Debridement of buttock, scrotum and left leg, placement of acell and vac;  Surgeon: Peggye Form, DO;  [facility];  Service: Plastics;  Laterality: Left;   I  & D EXTREMITY N/A 08/06/2018   Procedure: Debridement of buttock, scrotum and left leg;  Surgeon: Peggye Form, DO;  [facility];  Service: Plastics;  Laterality: N/A;   I & D EXTREMITY N/A 08/13/2018   Procedure: Debridement of buttock, scrotum and left leg, placement of acell and vac;  Surgeon: Peggye Form, DO;  [facility];  Service: Plastics;  Laterality: N/A;  90 min, please   INCISION AND DRAINAGE HIP Left 09/18/2019   Procedure: IRRIGATION AND DEBRIDEMENT HIP/ PELVIS WITH WOUND VAC PLACEMENT;  Surgeon: Roby Lofts, MD;  [facility];  Service: Orthopedics;  Laterality: Left;   INCISION AND DRAINAGE OF WOUND N/A 07/18/2018   Procedure: Debridement of left leg, buttocks and scrotal wound with placement of acell and Flexiseal;  Surgeon: Peggye Form, DO;  [facility];  Service: Plastics;  Laterality: N/A;   INCISION AND DRAINAGE OF WOUND Left 08/29/2018   Procedure: Debridement of buttock, scrotum and left leg, placement of acell and vac;  Surgeon: Peggye Form, DO;  [facility];  Service: Plastics;  Laterality: Left;  75 min, please   INCISION AND DRAINAGE OF WOUND Bilateral 10/23/2018   Procedure: DEBRIDEMENT OF BUTTOCK,SCROTUM, AND LEG WOUNDS WITH PLACEMENT OF ACELL- BILATERAL 90 MIN;  Surgeon: Peggye Form, DO;  [facility];  Service: Plastics;  Laterality: Bilateral;   IR ANGIOGRAM PELVIS SELECTIVE OR SUPRASELECTIVE  07/10/2018   IR ANGIOGRAM PELVIS SELECTIVE OR SUPRASELECTIVE  07/10/2018   IR ANGIOGRAM SELECTIVE EACH ADDITIONAL VESSEL  07/10/2018   IR EMBO ART  VEN HEMORR LYMPH EXTRAV  INC GUIDE ROADMAPPING  07/10/2018   IR GASTROSTOMY TUBE MOD SED  01/21/2021   IR NEPHROSTOMY PLACEMENT LEFT  04/05/2019   IR NEPHROSTOMY PLACEMENT RIGHT  05/31/2019   IR US GUIDE BX ASP/DRAIN  07/10/2018   IR US GUIDE VASC ACCESS RIGHT  07/10/2018   IR VENO/EXT/UNI LEFT  07/10/2018   IRRIGATION AND DEBRIDEMENT OF WOUND WITH SPLIT THICKNESS SKIN GRAFT  Left 09/19/2018   Procedure: Debridement of gluteal wound with placement of acell to left leg.;  Surgeon: Peggye Form, DO;  [facility];  Service: Plastics;  Laterality: Left;  2.5 hours, please   LAPAROTOMY N/A 07/12/2018  Procedure: EXPLORATORY LAPAROTOMY;  Surgeon: Violeta Gelinas, MD;  Location: [facility] OR;  Service: General;  Laterality: N/A;   LAPAROTOMY N/A 07/15/2018   Procedure: WOUND EXPLORATION; CLOSURE OF ABDOMEN;  Surgeon: Violeta Gelinas, MD;  Location: [facility] OR;  Service: General;  Laterality: N/A;   LAPAROTOMY  07/10/2018   Procedure: Exploratory Laparotomy;  Surgeon: Berna Bue, MD;  Location: [facility] OR;  Service: General;;   MASS EXCISION Left 09/18/2019   Procedure: EXCISION UPPER LEFT INNER THIGH WOUND;  Surgeon: Peggye Form, DO;  [facility];  Service: Plastics;  Laterality: Left;   NEPHROLITHOTOMY Right 07/15/2019   Procedure: NEPHROLITHOTOMY PERCUTANEOUS;  Surgeon: Malen Gauze, MD;  Location: [facility];  Service: Urology;  Laterality: Right;  90 MINS   PEG PLACEMENT N/A 08/14/2018   Procedure: PERCUTANEOUS ENDOSCOPIC GASTROSTOMY (PEG) PLACEMENT;  Surgeon: Violeta Gelinas, MD;  Location: [facility] ENDOSCOPY;  Service: General;  Laterality: N/A;   PERCUTANEOUS TRACHEOSTOMY N/A 08/02/2018   Procedure: PERCUTANEOUS TRACHEOSTOMY;  Surgeon: Violeta Gelinas, MD;  Location: [facility] OR;  Service: General;  Laterality: N/A;   RADIOLOGY WITH ANESTHESIA N/A 07/10/2018   Procedure: IR WITH ANESTHESIA;  Surgeon: Simonne Come, MD;  Location: [facility] OR;  Service: Radiology;  Laterality: N/A;   RADIOLOGY WITH ANESTHESIA Right 07/10/2018   Procedure: Ir With Anesthesia;  Surgeon: Simonne Come, MD;  Location: [facility] OR;  Service: Radiology;  Laterality: Right;   SCROTAL EXPLORATION N/A 07/15/2018   Procedure: SCROTUM DEBRIDEMENT;  Surgeon: Marcine Matar, MD;  Location: [facility] OR;  Service: Urology;  Laterality: N/A;   SHOULDER SURGERY     SKIN SPLIT GRAFT Right 09/19/2018    Procedure: Skin Graft Split Thickness;  Surgeon: Peggye Form, DO;  [facility];  Service: Plastics;  Laterality: Right;   SKIN SPLIT GRAFT N/A 10/03/2018   Procedure: Split thickness skin graft to gluteal area with acell placement;  Surgeon: Peggye Form, DO;  [facility];  Service: Plastics;  Laterality: N/A;  3 hours, please   VACUUM ASSISTED CLOSURE CHANGE N/A 07/12/2018   Procedure: ABDOMINAL VACUUM ASSISTED CLOSURE CHANGE and abdominal washout;  Surgeon: Violeta Gelinas, MD;  Location: [facility] OR;  Service: General;  Laterality: N/A;   WOUND DEBRIDEMENT Left 07/23/2018   Procedure: DEBRIDEMENT LEFT BUTTOCK  WOUND;  Surgeon: Violeta Gelinas, MD;  Location: [facility] OR;  Service: General;  Laterality: Left;   WOUND EXPLORATION Left 07/10/2018   Procedure: WOUND EXPLORATION LEFT GROIN;  Surgeon: Berna Bue, MD;  [facility];  Service: General;  Laterality: Left;   Past Medical History:  Diagnosis Date   Acute on chronic respiratory failure with hypoxia 06/2018   trach removed 11-16-2018, on vent from jan until may 2020 - uses albuterol prn   Anxiety    Bacteremia due to Pseudomonas 06/2018   Chronic osteomyelitis    Chronic pain syndrome    Clostridium difficile colitis 10/30/2019   tx with abx    Depression    DVT (deep venous thrombosis) 2020   right brachial post PICC line   History of blood transfusion 06/2018   History of Clostridioides difficile colitis    History of kidney stones    Hypertension    norvasc d/c by pcp on 11/05/19   Multiple traumatic injuries    Penile pain 11/18/2019   Pneumonia 11/2009   2020 x 2   Walker as ambulation aid    Wheelchair bound    electric   Wound discharge    left  hip wound with bloody/clear drainage change dressing q day surgilube with gauze, between legs wound using calcium algenate pad bid   BP (!) 155/95   Pulse (!) 108   SpO2 95%   Opioid Risk Score:   Fall Risk Score:  `1  Depression screen [facility] 2/9      09/30/2022   10:51 AM 08/17/2022    9:21 AM 07/25/2022   10:34 AM 06/17/2022   12:54 PM 03/31/2022   12:36 PM 01/12/2022   11:17 AM 11/17/2021   11:30 AM  Depression screen PHQ 2/9  Decreased Interest 2 1 0 0  0 1  Down, Depressed, Hopeless 3 1 0 0 3 1 1   PHQ - 2 Score 5 2 0 0 3 1 2   Altered sleeping 3    3    Tired, decreased energy 3    2    Change in appetite 3    3    Feeling bad or failure about yourself  3    3    Trouble concentrating 3    3    Moving slowly or fidgety/restless 1    2    Suicidal thoughts 0    0    PHQ-9 Score 21    19    Difficult doing work/chores Very difficult    Very difficult       Review of Systems  Musculoskeletal:  Positive for back pain.       Hip and groin pain spasms  Neurological:  Positive for dizziness, tremors, weakness and numbness.  Psychiatric/Behavioral:  Positive for confusion and dysphoric mood. The patient is nervous/anxious.   All other systems reviewed and are negative.      Objective:   Physical Exam  Awake, alert, a little sleepy- poor memory; in power w/c; accompanied by wife, NAD A couple of wheezes, but more rhonchi/fluid heard on exam  Abd firm- taut, fully distended; colostomy and SPC in place  2+ LE edema to knees B/L- pitting edema     Assessment & Plan:   Pt is a 57 yr old male with hx of Multitrauma- causing L femoral neck fx, degloving of L hip to going/scrotum, bladder neck trauma- got SPC, developed compartment syndrome -s/p surgery for that; also diverting colostomy, skin grafts, and s/p trach and PEG- PEG is out. 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 S/P screw removal and on IV ABX for L hip osteomyelitis. 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  B/L tennis elbow new-and B/L pending/forming ulnar neuropathy Osteomyelitis of L hip found again-on  PO vanc and Cipro  with new  PEG- 7/22- and colostomy from before.   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  Has R severe ulnar neuropathy as well. O2 3L full time.   Here for f/u on multitrauma and chronic L hip osteomyelitis with associated chronic pain.    HCTZ AND Lasix both cause low potassium and suggest potassium be added to meds, esp before he starts HCTZ.  2. Just refilled pain meds. Fentanyl and MSIR- will send in again since has been a few days. Would rather you spend 4.5 to 5 weeks between refills than run out early.   3. Can take Gemtesa, Imipramine and Oxybutynin- at the same  time -works differently for bladder spasms. Has refills in Oxybutynin and Imipramine.   4. Still taking Paxil- Dr Dawna Part said he cna take- also Dr Dawna Part give him Olanzapine and Benzo.    5. Still waiting on Neuropsych testing.    6. Going to do home study for sleep study - makes sense.   7. Abd firm, very distended- needs to have abd scanned- I'm concerned might have liver issues causing the fluid- Suggest calling GI and see if they can order.   Issues with Workman's comp. Either cardiac or Liver related?  8.  UDS done today by workman's comp  9. F/U in 6 weeks- double appt    I spent a total of  31  minutes on total care today- >50% coordination of care- due to d/w worker's comp and pt's  wife about plan as detailed above

## 2022-10-03 NOTE — Telephone Encounter (Signed)
Laretta Bolster called asking if we can set the patient up with Dr. Meridee Score for fistula per Dr. Janee Morn. Please call her at [phone].

## 2022-10-03 NOTE — Telephone Encounter (Signed)
Shanda Bumps who is Laretta Bolster? Does she work at Dr. Carollee Massed office? Is that doctor PCP? Not clear on message.

## 2022-10-04 NOTE — Telephone Encounter (Signed)
Patty please see note below from Dr. Violeta Gelinas dated 08/31/22. This is in his OV note that day.  I feel the best approach is to refer him to Dr. 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 I will be happy to see him back if he has any further issues or if problems develop with his ostomy, etc.   Janey Greaser, MD   See note below. Case manager calling trying to get him scheduled to see Dr. Meridee Score.

## 2022-10-04 NOTE — Telephone Encounter (Signed)
Dr Mansouraty please review and advise  

## 2022-10-04 NOTE — Telephone Encounter (Signed)
Laretta Bolster is the patient's nurse case manager that has called multiple times for patient see message below.

## 2022-10-04 NOTE — Telephone Encounter (Signed)
Patty, Please schedule this patient for my next available slot (okay to use overbook or held slot). Depending on the date of that clinic, please schedule the patient for an EGD with fluoroscopy and gastrocutaneous fistula attempt at closure (a few days or weeks after). If the patient does not want to have that scheduled until he is seen in clinic that is okay to. Thanks. GM  FYI HD about a patient of yours with what looks to be a persisting GC fistula.  Happy to try to attempt closure endoscopically if possible.

## 2022-10-05 ENCOUNTER — Other Ambulatory Visit: Payer: Self-pay | Admitting: *Deleted

## 2022-10-05 ENCOUNTER — Telehealth: Payer: Self-pay | Admitting: Family Medicine

## 2022-10-05 MED ORDER — HYDROCHLOROTHIAZIDE 12.5 MG PO TABS: 12.5000 mg | ORAL_TABLET | Freq: Every day | ORAL | 3 refills | Status: DC

## 2022-10-05 NOTE — Telephone Encounter (Signed)
[name],    Thanks for offering to help with this. If your schedule does not allow an endoscopic attempt, or if it is unsuccessful, then this patient should be reevaluated by interventional radiology.  They placed his feeding tube, and I believe they have an option for an occlusive or closure type device that they were offering to use on another patient of mine that you were managing with the same problem.  -HD

## 2022-10-05 NOTE — Telephone Encounter (Signed)
Rx resend to [facility]  Patient wife notified

## 2022-10-05 NOTE — Telephone Encounter (Signed)
Prescription Request  10/05/2022  LOV: 09/30/2022  What is the name of the medication or equipment?  hydrochlorothiazide (HYDRODIURIL) 12.5 MG tablet   Have you contacted your pharmacy to request a refill? Yes   Which pharmacy would you like this sent to?  [id number] - [place], [place] - [address] [place] [address] Phone: [phone] Fax: [phone]  [facility] - [place], [place] - [address] [place] [address] Phone: [phone] Fax: [phone]    Patient notified that their request is being sent to the clinical staff for review and that they should receive a response within 2 business days.   Please advise at Mobile [phone] (mobile)

## 2022-10-05 NOTE — Telephone Encounter (Signed)
HD, Agree. Let's get an attempt at endoscopic closure first (this seems to be a longstanding issue at this point). GM

## 2022-10-06 ENCOUNTER — Other Ambulatory Visit: Payer: Self-pay

## 2022-10-06 ENCOUNTER — Encounter (HOSPITAL_BASED_OUTPATIENT_CLINIC_OR_DEPARTMENT_OTHER): Payer: No Typology Code available for payment source | Admitting: Psychology

## 2022-10-06 DIAGNOSIS — T07XXXA Unspecified multiple injuries, initial encounter: Secondary | ICD-10-CM | POA: Diagnosis not present

## 2022-10-06 DIAGNOSIS — F09 Unspecified mental 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: Secondary | ICD-10-CM | POA: Diagnosis not present

## 2022-10-06 DIAGNOSIS — K316 Fistula of stomach and duodenum: Secondary | ICD-10-CM

## 2022-10-06 DIAGNOSIS — F064 Anxiety 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: Secondary | ICD-10-CM

## 2022-10-06 DIAGNOSIS — M8668 Other chronic osteomyelitis, other site: Secondary | ICD-10-CM | POA: Diagnosis not present

## 2022-10-06 DIAGNOSIS — F4312 Post-traumatic stress disorder, chronic: Secondary | ICD-10-CM

## 2022-10-06 NOTE — Telephone Encounter (Signed)
EGD has been scheduled for 11/02/22 at 9 am at [facility] with GM   Pt  needs ROV   Left message on machine to call back

## 2022-10-10 NOTE — Telephone Encounter (Signed)
Left message on machine to call back  

## 2022-10-11 NOTE — Telephone Encounter (Signed)
EGD scheduled for 11/02/22 at [facility] with GM, pt instructed and medications reviewed.  Patient instructions mailed to home.  Patient to call with any questions or concerns.   Dr Meridee Score there are no appt spots for this pt available prior to the case.  You are doubled booked everyday you are in clinic.  Does he need to have the office visit?

## 2022-10-12 NOTE — Telephone Encounter (Signed)
Christopher [name], Ideally I would like to talk with him. As I know he has a pretty significant mount of debility, maybe we can just do this is a virtual visit on 5/14 either at 1:10 PM or at 4:10 PM. This will not require my team any additional work. If this does not work, then I am okay for the patient to come on the 22nd and try to have a little bit more extensive discussion about the attempt at closure of this. For now keep the current scheduled date for procedure. Let me know what he decides. Thanks. GM

## 2022-10-13 ENCOUNTER — Encounter: Payer: No Typology Code available for payment source | Attending: Physical Medicine and Rehabilitation

## 2022-10-13 DIAGNOSIS — F4312 Post-traumatic stress disorder, chronic: Secondary | ICD-10-CM | POA: Diagnosis present

## 2022-10-13 DIAGNOSIS — T07XXXA Unspecified multiple injuries, initial encounter: Secondary | ICD-10-CM | POA: Insufficient documentation

## 2022-10-13 DIAGNOSIS — F09 Unspecified mental 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: Secondary | ICD-10-CM | POA: Insufficient documentation

## 2022-10-13 DIAGNOSIS — F064 Anxiety 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: Secondary | ICD-10-CM | POA: Insufficient documentation

## 2022-10-13 NOTE — Telephone Encounter (Signed)
The pt caregiver has agreed to the 5/14 4:10 pm video visit.  Appt made

## 2022-10-13 NOTE — Progress Notes (Signed)
Behavioral Observations:  The patient appeared well-groomed and appropriately dressed. His manners were polite and appropriate to the situation. The patient mentioned prior to testing that he "can not read very well" as well as that he struggles with numb hands at times. The patient's attitude towards testing was positive and his effort was good.  Neuropsychology Note  AUSENCIO DWYER completed 90 minutes of neuropsychological testing with technician, Staci Acosta, BA, under the supervision of Arley Phenix, PsyD., Clinical Neuropsychologist. The patient did not appear overtly distressed by the testing session, per behavioral observation or via self-report to the technician. Rest breaks were offered.   Clinical Decision Making: In considering the patient's current level of functioning, level of presumed impairment, nature of symptoms, emotional and behavioral responses during clinical interview, level of literacy, and observed level of motivation/effort, a battery of tests was selected by Dr. Kieth Brightly during initial consultation on 10/06/2022. This was communicated to the technician. Communication between the neuropsychologist and technician was ongoing throughout the testing session and changes were made as deemed necessary based on patient performance on testing, technician observations and additional pertinent factors such as those listed above.  Tests Administered: [employer], 3rd Edition (CVLT-3); Standard Form Comprehensive Attention Battery (CAB) Continuous Performance Test (CPT)  Results:  CVLT-3:    Core Score Summary      Immediate Recall      Score Raw score Scaled score Percentile rank        Trial 1 Correct 3 5 5         Trial 2 Correct 5 5 5         Trial 3 Correct 7 6 9         Trial 4 Correct 9 7 16         Trial 5 Correct 7 5 5         List B Correct 2 4 2         Delayed Recall      Score Raw score Scaled score Percentile rank         Short Delay Free Recall Correct 8 8 25         Short Delay Cued Recall Correct 9 7 16         Long Delay Free Recall Correct 9 8 25         Long Delay Cued Recall Correct 10 8 25         Yes/No Recognition      Score Raw score Scaled score Percentile rank        Total Hits 15 10 50        Total False Positives 1 11 63        Recognition Discriminability (d') 3.4 11 63        Recognition Discriminability Nonparametric 96 11 63        Recall Errors      Score Raw score Scaled score Percentile rank        Total Intrusions 2 11 63                 Feedback to Patient: HEVER MISHOE will return on 01/18/2023 for an interactive feedback session with Dr. Kieth Brightly at which time his test performances, clinical impressions and treatment recommendations will be reviewed in detail. The patient understands he can contact our office should he require our assistance before this time.  90 minutes spent face-to-face with patient administering standardized tests, 30 minutes spent scoring Radiographer, therapeutic). [CPT [id number], [id number]]  Full report to  follow.

## 2022-10-14 ENCOUNTER — Other Ambulatory Visit: Payer: Self-pay | Admitting: Physical Medicine and Rehabilitation

## 2022-10-14 ENCOUNTER — Ambulatory Visit: Payer: PRIVATE HEALTH INSURANCE | Admitting: Family Medicine

## 2022-10-14 MED ORDER — PROMETHAZINE HCL 12.5 MG PO TABS: 12.5000 mg | ORAL_TABLET | Freq: Four times a day (QID) | ORAL | 1 refills | Status: DC | PRN

## 2022-10-17 ENCOUNTER — Telehealth: Payer: Self-pay | Admitting: Gastroenterology

## 2022-10-17 NOTE — Telephone Encounter (Signed)
Sue Lush with [facility] pre authorization center called to find out the insurance we would use for the procedure being done on 5/22 at [facility]. He has medicare A and workers comp. Please call her  at [phone] opt [phone] ext [phone]

## 2022-10-18 ENCOUNTER — Telehealth: Payer: Self-pay | Admitting: Family Medicine

## 2022-10-18 NOTE — Telephone Encounter (Signed)
Patient dropped off document Home Health Certificate (Order ID ([id number]), to be filled out by provider. Patient requested to send it via Fax within 2-days. Document is located in providers tray at front office.

## 2022-10-19 ENCOUNTER — Other Ambulatory Visit: Payer: Self-pay | Admitting: Family Medicine

## 2022-10-19 ENCOUNTER — Encounter: Payer: No Typology Code available for payment source | Admitting: Psychology

## 2022-10-19 DIAGNOSIS — F5102 Adjustment insomnia: Secondary | ICD-10-CM

## 2022-10-19 DIAGNOSIS — F064 Anxiety 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: Secondary | ICD-10-CM

## 2022-10-19 DIAGNOSIS — Z72820 Sleep deprivation: Secondary | ICD-10-CM | POA: Diagnosis not present

## 2022-10-19 DIAGNOSIS — G8921 Chronic pain due to trauma: Secondary | ICD-10-CM | POA: Diagnosis not present

## 2022-10-19 DIAGNOSIS — F09 Unspecified mental 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: Secondary | ICD-10-CM
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
## 2022-10-25 ENCOUNTER — Encounter (HOSPITAL_COMMUNITY): Payer: Self-pay | Admitting: Gastroenterology

## 2022-10-25 ENCOUNTER — Telehealth (INDEPENDENT_AMBULATORY_CARE_PROVIDER_SITE_OTHER): Payer: No Typology Code available for payment source | Admitting: Gastroenterology

## 2022-10-25 DIAGNOSIS — Z9189 Other specified personal risk factors, not elsewhere classified: Secondary | ICD-10-CM

## 2022-10-25 DIAGNOSIS — K316 Fistula of stomach and duodenum: Secondary | ICD-10-CM | POA: Diagnosis not present

## 2022-10-25 NOTE — H&P (View-Only) (Signed)
 GASTROENTEROLOGY OUTPATIENT CLINIC VISIT   Primary Care Provider Parker, Caleb M, MD [address] [place] [address] [phone]  Referring Provider Dr. Thompson and Dr. Danis  Patient Profile: Christopher [name] is a 57 y.o. male with a pmh significant for paraplegia, chronic COPD (on 4 L O2), chronic osteomyelitis, chronic infections, MDD, colostomy in place, gastrocutaneous fistula.  The patient presents to the  Gastroenterology Clinic for an evaluation and management of problem(s) noted below:  Problem List 1. Gastrocutaneous fistula   2. At risk for complication of anesthesia     I connected with  Damoney B Mandelbaum on 10/25/2022 I verified that I was speaking with the correct person using two identifiers. This service was provided via telemedicine using Audio/Visual Media. The patient was located at home. The provider was located in the office. The patient did consent to this visit and is aware of charges through their insurance as well as the limitations of evaluation and management by telemedicine. The patient was referred by Dr. Thompson and Dr. Danis. Other persons participating in this telemedicine service were the patient's wife. Time spent on visit was 30 minutes.   History of Present Illness Please see prior notes for full details of HPI.  Interval History Our surgical colleagues have reached out to us to see if there is an endoscopic approach of trying to close his gastrocutaneous fistula.  Patient's wife and patient give history today on telemedicine visit.  They state that approximately 10 to 11 months ago the previous PEG tube fell out.  For quite a while the patient did well without any issues.  Then all of a sudden the last few months he began to experience increasing amounts of fluid discharge from the site.  He has not noted any blood.  Patient is able to eat regularly at this point and has been stable on his weight.  They are hopeful that they  can prevent needing another surgical option if possible.  Patient is scheduled for a procedure with me in the next couple of weeks.  He has not had an endoscopy for quite a few years since his PEG tube was placed.  He is not having any dysphagia symptoms.  GI Review of Systems Positive as above Negative for odynophagia, nausea, vomiting, melena, hematochezia  Review of Systems General: Denies fevers/chills/weight loss unintentionally Cardiovascular: Denies chest pain Pulmonary: Shortness of breath at his baseline Gastroenterological: See HPI Genitourinary: Denies darkened urine Hematological: Denies easy bruising/bleeding Dermatological: Denies jaundice Psychological: Mood is stable   Medications Current Outpatient Medications  Medication Sig Dispense Refill   albuterol (VENTOLIN HFA) 108 (90 Base) MCG/ACT inhaler INHALE 2 PUFFS BY MOUTH EVERY 6 HOURS AS NEEDED FOR WHEEZE OR SHORTNESS OF BREATH 18 each 1   Blood Pressure Monitoring (BLOOD PRESSURE CUFF) MISC Use daily as needed to check blood pressure. 1 each 0   Budeson-Glycopyrrol-Formoterol (BREZTRI AEROSPHERE) 160-9-4.8 MCG/ACT AERO Inhale 2 puffs into the lungs in the morning and at bedtime. 1 each 5   ciprofloxacin (CIPRO) 500 MG tablet Take 1 tablet (500 mg total) by mouth 2 (two) times daily. 60 tablet 11   dextromethorphan-guaiFENesin (MUCINEX DM) 30-600 MG 12hr tablet Take 2 tablets by mouth 2 (two) times daily.     famotidine (PEPCID) 40 MG tablet TAKE 1 TABLET BY MOUTH TWICE A DAY (Patient taking differently: Take 40 mg by mouth 2 (two) times daily.) 180 tablet 1   fentaNYL (DURAGESIC) 75 MCG/HR Place 1 patch onto the skin every   other day. Refill 10/25/22 15 patch 0   furosemide (LASIX) 40 MG tablet Take 1 tablet (40 mg total) by mouth daily. 90 tablet 1   gabapentin (NEURONTIN) 300 MG capsule TAKE 1 CAPSULE BY MOUTH THREE TIMES A DAY 90 capsule 5   GEMTESA 75 MG TABS Take 1 tablet by mouth daily.     hydrochlorothiazide  (HYDRODIURIL) 12.5 MG tablet Take 1 tablet (12.5 mg total) by mouth daily. 90 tablet 3   imipramine (TOFRANIL) 25 MG tablet Take 1 tablet (25 mg total) by mouth in the morning, at noon, and at bedtime. For bladder spasms 270 tablet 1   lidocaine (LIDODERM) 5 % APPLY 3 PATCHES TO SKIN EVERY 12 HOURS AND DISCARD WITHING 12 HOURS OR AS DIRECTED 90 patch 5   LINZESS 72 MCG capsule TAKE 2 CAPSULES (145 MCG TOTAL) BY MOUTH DAILY BEFORE BREAKFAST. 30 capsule 5   LORazepam (ATIVAN) 0.5 MG tablet Take 0.5 mg by mouth 3 (three) times daily.     melatonin ([employer] MELATONIN) 3 MG TABS tablet Take 1 tablet (3 mg total) by mouth at bedtime. 10 tablet 0   methocarbamol (ROBAXIN) 500 MG tablet Take 1 tablet (500 mg total) by mouth every 6 (six) hours as needed for muscle spasms. 120 tablet 5   morphine (MSIR) 15 MG tablet Take 1 tablet (15 mg total) by mouth every 3 (three) hours as needed for severe pain. Can get refilled 10/25/22 200 tablet 0   Multiple Vitamin (MULTIVITAMIN WITH MINERALS) TABS tablet Take 1 tablet by mouth daily.     naloxone (NARCAN) nasal spray 4 mg/0.1 mL To use if pt develops unconsciousness or confusion that family thinks is related to opioids. 1 each 3   OLANZapine (ZYPREXA) 10 MG tablet Take 10 mg by mouth at bedtime.     OLANZapine (ZYPREXA) 2.5 MG tablet Take 2.5 mg by mouth in the morning and at bedtime.     OLANZapine (ZYPREXA) 5 MG tablet Take by mouth.     oxybutynin (DITROPAN) 5 MG tablet Take 5 mg by mouth See admin instructions. Take 5 mg by mouth three times a day and an additional 5 mg once daily as needed for urinary urgency     PARoxetine (PAXIL) 40 MG tablet TAKE 1 TABLET BY MOUTH EVERYDAY AT BEDTIME 90 tablet 2   Probiotic Product (PROBIOTIC COLON SUPPORT) CAPS Take 1 capsule by mouth daily.     promethazine (PHENERGAN) 12.5 MG tablet Take 1 tablet (12.5 mg total) by mouth every 6 (six) hours as needed for nausea, vomiting or refractory nausea / vomiting. 270 tablet 1    senna-docusate (SENOKOT-S) 8.6-50 MG tablet Take 2 tablets by mouth 3 (three) times daily.     vancomycin (VANCOCIN) 125 MG capsule Take 1 capsule (125 mg total) by mouth in the morning and at bedtime. 60 capsule 11   No current facility-administered medications for this visit.    Allergies Allergies  Allergen Reactions   Methadone Other (See Comments)    Hallucinations/confusion    Histories Past Medical History:  Diagnosis Date   Acute on chronic respiratory failure with hypoxia (HCC) 06/2018   trach removed 11-16-2018, on vent from jan until may 2020 - uses albuterol prn   Anxiety    Bacteremia due to Pseudomonas 06/2018   Chronic osteomyelitis (HCC)    Chronic pain syndrome    Clostridium difficile colitis 10/30/2019   tx with abx    Depression    DVT (deep venous thrombosis) (HCC)   2020   right brachial post PICC line   History of blood transfusion 06/2018   History of Clostridioides difficile colitis    History of kidney stones    Hypertension    norvasc d/c by pcp on 11/05/19   Multiple traumatic injuries    Penile pain 11/18/2019   Pneumonia 11/2009   2020 x 2   Walker as ambulation aid    Wheelchair bound    electric   Wound discharge    left hip wound with bloody/clear drainage change dressing q day surgilube with gauze, between legs wound using calcium algenate pad bid   Past Surgical History:  Procedure Laterality Date   APPLICATION OF A-CELL OF BACK N/A 08/06/2018   Procedure: Application Of A-Cell Of Back;  Surgeon: Dillingham, Claire S, DO;  [facility];  Service: Plastics;  Laterality: N/A;   APPLICATION OF A-CELL OF EXTREMITY Left 08/06/2018   Procedure: Application Of A-Cell Of Extremity;  Surgeon: Dillingham, Claire S, DO;  [facility]: [facility];  Service: Plastics;  Laterality: Left;   APPLICATION OF A-CELL OF EXTREMITY Left 09/18/2019   Procedure: APPLICATION OF A-CELL OF EXTREMITY;  Surgeon: Dillingham, Claire S, DO;  [facility];  Service: Plastics;   Laterality: Left;   APPLICATION OF WOUND VAC  07/12/2018   Procedure: Application Of Wound Vac to the Left Thigh and Scrotum.;  Surgeon: Haddix, Kevin P, MD;  [facility];  Service: Orthopedics;;   APPLICATION OF WOUND VAC  07/10/2018   Procedure: Application Of Wound Vac;  Surgeon: Connor, Chelsea A, MD;  [facility];  Service: General;;   COLON SURGERY  2020   colostomy   COLOSTOMY N/A 07/23/2018   Procedure: COLOSTOMY;  Surgeon: Thompson, Burke, MD;  [facility];  Service: General;  Laterality: N/A;   CYSTOSCOPY W/ URETERAL STENT PLACEMENT N/A 07/15/2018   Procedure: RETROGRADE URETHROGRAM;  Surgeon: Dahlstedt, Stephen, MD;  [facility];  Service: Urology;  Laterality: N/A;   CYSTOSCOPY WITH LITHOLAPAXY N/A 05/06/2019   Procedure: CYSTOSCOPY BASKET BLADDER STONE EXTRACTION;  Surgeon: McKenzie, Patrick L, MD;  Location: [facility];  Service: Urology;  Laterality: N/A;  30 MINS   CYSTOSTOMY N/A 05/06/2019   Procedure: REPLACEMENT OF SUPRAPUBIC CATHETER;  Surgeon: McKenzie, Patrick L, MD;  Location: [facility];  Service: Urology;  Laterality: N/A;   DEBRIDEMENT AND CLOSURE WOUND Left 03/04/2019   Procedure: Excision of hip wound with placement of Acell;  Surgeon: Dillingham, Claire S, DO;  [facility];  Service: Plastics;  Laterality: Left;   ESOPHAGOGASTRODUODENOSCOPY N/A 08/14/2018   Procedure: ESOPHAGOGASTRODUODENOSCOPY (EGD);  Surgeon: Thompson, Burke, MD;  [facility];  Service: General;  Laterality: N/A;  bedside   FACIAL RECONSTRUCTION SURGERY     X 2--once as a teenager and second time in his 30's   HARDWARE REMOVAL Left 03/04/2019   Procedure: Left Hip Hardware Removal;  Surgeon: Haddix, Kevin P, MD;  [facility];  Service: Orthopedics;  Laterality: Left;   HIP PINNING,CANNULATED Left 07/12/2018   Procedure: CANNULATED HIP PINNING;  Surgeon: Haddix, Kevin P, MD;  [facility];  Service: Orthopedics;  Laterality: Left;   HIP  SURGERY     HOLMIUM LASER APPLICATION Right 07/15/2019   Procedure: HOLMIUM LASER APPLICATION;  Surgeon: McKenzie, Patrick L, MD;  Location: [facility];  Service: Urology;  Laterality: Right;   I & D EXTREMITY Left 07/25/2018   Procedure: Debridement of buttock, scrotum and left leg, placement   of acell and vac;  Surgeon: Dillingham, Claire S, DO;  [facility];  Service: Plastics;  Laterality: Left;   I & D EXTREMITY N/A 08/06/2018   Procedure: Debridement of buttock, scrotum and left leg;  Surgeon: Dillingham, Claire S, DO;  [facility];  Service: Plastics;  Laterality: N/A;   I & D EXTREMITY N/A 08/13/2018   Procedure: Debridement of buttock, scrotum and left leg, placement of acell and vac;  Surgeon: Dillingham, Claire S, DO;  [facility];  Service: Plastics;  Laterality: N/A;  90 min, please   INCISION AND DRAINAGE HIP Left 09/18/2019   Procedure: IRRIGATION AND DEBRIDEMENT HIP/ PELVIS WITH WOUND VAC PLACEMENT;  Surgeon: Haddix, Kevin P, MD;  [facility];  Service: Orthopedics;  Laterality: Left;   INCISION AND DRAINAGE OF WOUND N/A 07/18/2018   Procedure: Debridement of left leg, buttocks and scrotal wound with placement of acell and Flexiseal;  Surgeon: Dillingham, Claire S, DO;  [facility];  Service: Plastics;  Laterality: N/A;   INCISION AND DRAINAGE OF WOUND Left 08/29/2018   Procedure: Debridement of buttock, scrotum and left leg, placement of acell and vac;  Surgeon: Dillingham, Claire S, DO;  [facility];  Service: Plastics;  Laterality: Left;  75 min, please   INCISION AND DRAINAGE OF WOUND Bilateral 10/23/2018   Procedure: DEBRIDEMENT OF BUTTOCK,SCROTUM, AND LEG WOUNDS WITH PLACEMENT OF ACELL- BILATERAL 90 MIN;  Surgeon: Dillingham, Claire S, DO;  [facility];  Service: Plastics;  Laterality: Bilateral;   IR ANGIOGRAM PELVIS SELECTIVE OR SUPRASELECTIVE  07/10/2018   IR ANGIOGRAM PELVIS SELECTIVE OR SUPRASELECTIVE  07/10/2018   IR ANGIOGRAM SELECTIVE EACH  ADDITIONAL VESSEL  07/10/2018   IR EMBO ART  VEN HEMORR LYMPH EXTRAV  INC GUIDE ROADMAPPING  07/10/2018   IR GASTROSTOMY TUBE MOD SED  01/21/2021   IR NEPHROSTOMY PLACEMENT LEFT  04/05/2019   IR NEPHROSTOMY PLACEMENT RIGHT  05/31/2019   IR US GUIDE BX ASP/DRAIN  07/10/2018   IR US GUIDE VASC ACCESS RIGHT  07/10/2018   IR VENO/EXT/UNI LEFT  07/10/2018   IRRIGATION AND DEBRIDEMENT OF WOUND WITH SPLIT THICKNESS SKIN GRAFT Left 09/19/2018   Procedure: Debridement of gluteal wound with placement of acell to left leg.;  Surgeon: Dillingham, Claire S, DO;  [facility];  Service: Plastics;  Laterality: Left;  2.5 hours, please   LAPAROTOMY N/A 07/12/2018   Procedure: EXPLORATORY LAPAROTOMY;  Surgeon: Thompson, Burke, MD;  [facility];  Service: General;  Laterality: N/A;   LAPAROTOMY N/A 07/15/2018   Procedure: WOUND EXPLORATION; CLOSURE OF ABDOMEN;  Surgeon: Thompson, Burke, MD;  [facility];  Service: General;  Laterality: N/A;   LAPAROTOMY  07/10/2018   Procedure: Exploratory Laparotomy;  Surgeon: Connor, Chelsea A, MD;  [facility];  Service: General;;   MASS EXCISION Left 09/18/2019   Procedure: EXCISION UPPER LEFT INNER THIGH WOUND;  Surgeon: Dillingham, Claire S, DO;  [facility];  Service: Plastics;  Laterality: Left;   NEPHROLITHOTOMY Right 07/15/2019   Procedure: NEPHROLITHOTOMY PERCUTANEOUS;  Surgeon: McKenzie, Patrick L, MD;  Location: [facility];  Service: Urology;  Laterality: Right;  90 MINS   PEG PLACEMENT N/A 08/14/2018   Procedure: PERCUTANEOUS ENDOSCOPIC GASTROSTOMY (PEG) PLACEMENT;  Surgeon: Thompson, Burke, MD;  [facility];  Service: General;  Laterality: N/A;   PERCUTANEOUS TRACHEOSTOMY N/A 08/02/2018   Procedure: PERCUTANEOUS TRACHEOSTOMY;  Surgeon: Thompson, Burke, MD;  [facility];  Service: General;  Laterality: N/A;     RADIOLOGY WITH ANESTHESIA N/A 07/10/2018   Procedure: IR WITH ANESTHESIA;  Surgeon: Watts, John, MD;  [facility];   Service: Radiology;  Laterality: N/A;   RADIOLOGY WITH ANESTHESIA Right 07/10/2018   Procedure: Ir With Anesthesia;  Surgeon: Watts, John, MD;  [facility];  Service: Radiology;  Laterality: Right;   SCROTAL EXPLORATION N/A 07/15/2018   Procedure: SCROTUM DEBRIDEMENT;  Surgeon: Dahlstedt, Stephen, MD;  [facility];  Service: Urology;  Laterality: N/A;   SHOULDER SURGERY     SKIN SPLIT GRAFT Right 09/19/2018   Procedure: Skin Graft Split Thickness;  Surgeon: Dillingham, Claire S, DO;  [facility];  Service: Plastics;  Laterality: Right;   SKIN SPLIT GRAFT N/A 10/03/2018   Procedure: Split thickness skin graft to gluteal area with acell placement;  Surgeon: Dillingham, Claire S, DO;  [facility];  Service: Plastics;  Laterality: N/A;  3 hours, please   VACUUM ASSISTED CLOSURE CHANGE N/A 07/12/2018   Procedure: ABDOMINAL VACUUM ASSISTED CLOSURE CHANGE and abdominal washout;  Surgeon: Thompson, Burke, MD;  [facility];  Service: General;  Laterality: N/A;   WOUND DEBRIDEMENT Left 07/23/2018   Procedure: DEBRIDEMENT LEFT BUTTOCK  WOUND;  Surgeon: Thompson, Burke, MD;  [facility];  Service: General;  Laterality: Left;   WOUND EXPLORATION Left 07/10/2018   Procedure: WOUND EXPLORATION LEFT GROIN;  Surgeon: Connor, Chelsea A, MD;  [facility];  Service: General;  Laterality: Left;   Social History   Socioeconomic History   Marital status: Married    Spouse name: Not on file   Number of children: Not on file   Years of education: Not on file   Highest education level: Not on file  Occupational History   Occupation: Disable  Tobacco Use   Smoking status: Former    Packs/day: 1.00    Years: 20.00    Additional pack years: 0.00    Total pack years: 20.00    Types: Cigarettes    Quit date: 07/10/2018    Years since quitting: 4.3   Smokeless tobacco: Never  Vaping Use   Vaping Use: Never used  Substance and Sexual Activity   Alcohol use: Never   Drug use: Yes    Types:  Oxycodone, Fentanyl    Comment: Fentanyl patch/oxycodone since 06/2018   Sexual activity: Yes  Other Topics Concern   Not on file  Social History Narrative   ** Merged History Encounter **       Social Determinants of Health   Financial Resource Strain: Low Risk  (03/01/2019)   Overall Financial Resource Strain (CARDIA)    Difficulty of Paying Living Expenses: Not very hard  Food Insecurity: Food Insecurity Present (03/01/2019)   Hunger Vital Sign    Worried About Running Out of Food in the Last Year: Sometimes true    Ran Out of Food in the Last Year: Sometimes true  Transportation Needs: No Transportation Needs (03/01/2019)   PRAPARE - Transportation    Lack of Transportation (Medical): No    Lack of Transportation (Non-Medical): No  Physical Activity: Inactive (03/01/2019)   Exercise Vital Sign    Days of Exercise per Week: 0 days    Minutes of Exercise per Session: 0 min  Stress: Stress Concern Present (03/01/2019)   [employer] - Occupational Stress Questionnaire    Feeling of Stress : Rather much  Social Connections: Not on file  Intimate Partner Violence: Not on file   Family History  Problem   Relation Age of Onset   Breast cancer Mother        with mets to the bones   I have reviewed his medical, social, and family history in detail and updated the electronic medical record as necessary.    PHYSICAL EXAMINATION  Telehealth visit   REVIEW OF DATA  I reviewed the following data at the time of this encounter:  GI Procedures and Studies  2020 EGD done by Dr. Thompson No gross lesions were noted in the esophagus. Findings: No gross lesions were noted in the stomach. Placement of an externally removable PEG with no Tfasteners was successfully completed. The external bumper was at the 3.0 cm marking on the tube. Estimated blood loss: none.  Laboratory Studies  Reviewed those in epic  Imaging Studies  September 2023 CT abdomen  pelvis IMPRESSION: 1. No evidence of acute abnormality.  No evidence of focal abscess. 2. Suprapubic catheter within a collapsed bladder with 1 cm bladder calculus again noted. 3. Unchanged 1.5 cm RIGHT adrenal adenoma. 4. Aortic Atherosclerosis (ICD10-I70.0).   ASSESSMENT  Mr. Heiny is a 57 y.o. male with a pmh significant for paraplegia, chronic COPD (on 4 L O2), chronic osteomyelitis, chronic infections, MDD, colostomy in place, gastrocutaneous fistula. The patient is seen today for evaluation and management of:  1. Gastrocutaneous fistula   2. At risk for complication of anesthesia    The patient is currently stable hemodynamically and clinically.  He has a persisting gastrocutaneous fistula.  Closure of these gastrocutaneous fistulas endoscopically has in the literature up to a 50 to 75% chance of closure depending on the latency of having this particular type of issue.  The more chronic fistulas are more difficult to close.  Also, depending on previous surgical history and the way that the stomach is apposition, endoscopic closure may not be possible but certainly worth attempt.  It seems like the patient is not having any dysphagia symptoms so are 17 mm OVESCO clip hopefully will be able to pass into the area.  The risks and benefits of endoscopic evaluation were discussed with the patient; these include but are not limited to the risk of perforation, infection, bleeding, missed lesions, lack of diagnosis, severe illness requiring hospitalization, as well as anesthesia and sedation related illnesses.  The patient and/or family is agreeable to proceed.  I think for this patient, the biggest thing will be his risk from an anesthesia standpoint nor that from a procedure standpoint itself.  And so he has an updated echocardiogram to be performed next week will be evaluated by anesthesia team next week as well.  Will see where things stand as long as he is an appropriate candidate we will  attempt to close this endoscopically.  If we are not successful with an endoscopic closure, then will discuss with our interventional radiology colleagues whether they may be able to do a plug to the area to see if that may be helpful.  All patient questions were answered to the best of my ability, and the patient agrees to the aforementioned plan of action with follow-up as indicated.   PLAN  Proceed with our scheduled endoscopy with attempt at endoscopic closure of gastrocutaneous fistula Follow-up echocardiogram scheduled for early next week as well as with PCP If something is found on his echo that is concerning this may postpone ability to perform procedure from anesthesia standpoint Will need to keep in mind our IR colleagues have a new technique where they may be able to use   a plug to close this area to   No orders of the defined types were placed in this encounter.   New Prescriptions   No medications on file    Planned Follow Up No follow-ups on file.   Total Time in Face-to-Face and in Coordination of Care for patient including independent/personal interpretation/review of prior testing, medical history, examination, medication adjustment, communicating results with the patient directly, and documentation within the EHR is 30 minutes.   Browning Southwood Mansouraty, MD [facility] Office # [phone]  
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
## 2022-10-26 ENCOUNTER — Other Ambulatory Visit: Payer: Self-pay

## 2022-10-26 NOTE — Progress Notes (Signed)
I have sent a message to [facility] endo a copy of the pulmonary clearance.
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
## 2022-10-26 NOTE — Progress Notes (Signed)
This is a pre-operative pulmonary evaluation for Christopher [name] for EGD/colon under anesthesia.   ASSESSMENT AND RECOMMENDATIONS:  Preoperative Risk Calculation: The features of this patient's history that contribute to the pulmonary risk assessment include: Age, COPD, General anesthesia,   This patient has an intermediate risk of post-operative pulmonary complications by ARISCAT Index.  The absolute assessment of risk/benefit of the procedure is deferred to the primary team's evaluation.  - Patient's Estimated risk of postoperative respiratory failure is 13.3% (27 pts) based on the ARISCAT Index.   0 to 25 points: Low risk: 1.6% pulmonary complication rate  26 to 44 points: Intermediate risk: 13.3% pulmonary complication rate  45 to 123 points: High risk: 42.1% pulmonary complication rate  Postoperative respiratory failure (PRF) is considered as failure to wean from mechanical ventilation within 48 hours of surgery or unplanned intubation/reintubation postoperatively. The validated risk calculator provides a risk estimate of PRF and is anticipated to aid in surgical decision-making and informed patient consent.  However risk can be accepted given the potential benefit of this intervention and it is not prohibitive.  RECOMMENDATIONS:  In order to minimize the risk of complications and optimize pulmonary status, we recommend the following: - Encourage aggressive incentive spirometry hourly both peri-operatively and post-operatively as tolerated  - Bronchodilators as needed for wheezing or shortness of breath - Oral steroids only if the patient appears to have component of COPD exacerbation, otherwise no routine utilization needed - Intraoperatively keep OR time to the shortest as possible    ARISCAT: Mazo et al. Anesthesiology [phone]  Durel Salts, MD Pulmonary and Critical Care Medicine [facility] 10/26/2022 1:40 PM Pager: see AMION  If no response to pager,  please call critical care on call (see AMION) until 7pm After 7:00 pm call Elink

## 2022-10-27 ENCOUNTER — Ambulatory Visit: Payer: No Typology Code available for payment source | Admitting: Family

## 2022-10-27 ENCOUNTER — Other Ambulatory Visit: Payer: Self-pay

## 2022-10-27 ENCOUNTER — Encounter: Payer: Self-pay | Admitting: Family

## 2022-10-27 VITALS — BP 107/74 | HR 104 | Temp 97.3°F

## 2022-10-27 DIAGNOSIS — M8668 Other chronic osteomyelitis, other site: Secondary | ICD-10-CM | POA: Diagnosis not present

## 2022-10-27 DIAGNOSIS — Z5181 Encounter for therapeutic drug level monitoring: Secondary | ICD-10-CM | POA: Insufficient documentation

## 2022-10-27 DIAGNOSIS — A498 Other bacterial infections of unspecified site: Secondary | ICD-10-CM | POA: Diagnosis not present

## 2022-10-27 MED ORDER — CIPROFLOXACIN HCL 500 MG PO TABS
500.0000 mg | ORAL_TABLET | Freq: Two times a day (BID) | ORAL | 11 refills | Status: DC
Start: 2022-10-27 — End: 2023-09-26

## 2022-10-27 MED ORDER — VANCOMYCIN HCL 125 MG PO CAPS
125.0000 mg | ORAL_CAPSULE | Freq: Two times a day (BID) | ORAL | 11 refills | Status: DC
Start: 2022-10-27 — End: 2023-09-26

## 2022-10-27 NOTE — Assessment & Plan Note (Signed)
Christopher [name] is looking well today with good adherence and tolerance to ciprofloxacin. Wounds are waxing and waning as before. Discussed that wounds are unlikely to heal in the presence of infection. Infection appears to be suppressed at this point. Will continue with current dose of ciprofloxacin and oral vancomycin for C. Diff prophylaxis. Message sent to pharmacy regarding need to continue with ciprofloxacin indefinitely secondary to Pseudomonas infection. Plan for follow up in 6 months or sooner if needed.

## 2022-10-27 NOTE — Assessment & Plan Note (Signed)
Kamon's renal function is stable and there is no evidence of tendinopathy or diarrhea. Remains on oral vancomycin for prophylaxis from C. Difficile with long term flouroquinolone use.

## 2022-10-27 NOTE — Patient Instructions (Signed)
Nice to see you.  Continue to take your meds as prescribed.  Plan for follow up in 6 months or sooner if needed.  Have a great day and stay safe!

## 2022-10-27 NOTE — Progress Notes (Signed)
Subjective:    Patient ID: Christopher [name], male    DOB: 1966/04/01, 57 y.o.   MRN: [id number]  Chief Complaint  Patient presents with   Follow-up   Osteomyelitis     HPI:  JAYRON BOCCIO is a 57 y.o. male with previous medical history of multiple pelvic fractures sustained from trauma complicated by hardware associated osteomyelitis status post hardware and excision of bone with cultures positive for multidrug-resistant Pseudomonas and maintained on ciprofloxacin and oral vancomycin last seen on 04/21/2022 with good adherence and tolerance to ciprofloxacin and oral vancomycin.  Here today with wife [name] and caseworker.  Shubh has been doing well since his last office visit with good adherence and tolerance to ciprofloxacin and oral vancomycin. Scheduled to undergo EGD with GI on 5/22 for assisted closure of G-tube site which does have some drainage at times. Wounds continue to wax and wane with some drainage present at times. Eating primarily sweets and ice cream. No fevers or chills. Mood has been improved following recent exacerbation of anxiety.    Allergies  Allergen Reactions   Methadone Other (See Comments)    Hallucinations/confusion      Outpatient Medications Prior to Visit  Medication Sig Dispense Refill   albuterol (VENTOLIN HFA) 108 (90 Base) MCG/ACT inhaler INHALE 2 PUFFS BY MOUTH EVERY 6 HOURS AS NEEDED FOR WHEEZE OR SHORTNESS OF BREATH 18 each 1   Blood Pressure Monitoring (BLOOD PRESSURE CUFF) MISC Use daily as needed to check blood pressure. 1 each 0   Budeson-Glycopyrrol-Formoterol (BREZTRI AEROSPHERE) 160-9-4.8 MCG/ACT AERO Inhale 2 puffs into the lungs in the morning and at bedtime. 1 each 5   dextromethorphan-guaiFENesin (MUCINEX DM) 30-600 MG 12hr tablet Take 2 tablets by mouth 2 (two) times daily.     famotidine (PEPCID) 40 MG tablet TAKE 1 TABLET BY MOUTH TWICE A DAY (Patient taking differently: Take 40 mg by mouth 2 (two) times daily.) 180 tablet 1    fentaNYL (DURAGESIC) 75 MCG/HR Place 1 patch onto the skin every other day. Refill 10/25/22 15 patch 0   furosemide (LASIX) 40 MG tablet Take 1 tablet (40 mg total) by mouth daily. 90 tablet 1   gabapentin (NEURONTIN) 300 MG capsule TAKE 1 CAPSULE BY MOUTH THREE TIMES A DAY 90 capsule 5   GEMTESA 75 MG TABS Take 1 tablet by mouth daily.     hydrochlorothiazide (HYDRODIURIL) 12.5 MG tablet Take 1 tablet (12.5 mg total) by mouth daily. 90 tablet 3   imipramine (TOFRANIL) 25 MG tablet Take 1 tablet (25 mg total) by mouth in the morning, at noon, and at bedtime. For bladder spasms 270 tablet 1   lidocaine (LIDODERM) 5 % APPLY 3 PATCHES TO SKIN EVERY 12 HOURS AND DISCARD WITHING 12 HOURS OR AS DIRECTED 90 patch 5   LINZESS 72 MCG capsule TAKE 2 CAPSULES (145 MCG TOTAL) BY MOUTH DAILY BEFORE BREAKFAST. 30 capsule 5   LORazepam (ATIVAN) 0.5 MG tablet Take 0.5 mg by mouth 3 (three) times daily.     melatonin ([employer] MELATONIN) 3 MG TABS tablet Take 1 tablet (3 mg total) by mouth at bedtime. 10 tablet 0   methocarbamol (ROBAXIN) 500 MG tablet Take 1 tablet (500 mg total) by mouth every 6 (six) hours as needed for muscle spasms. 120 tablet 5   morphine (MSIR) 15 MG tablet Take 1 tablet (15 mg total) by mouth every 3 (three) hours as needed for severe pain. Can get refilled 10/25/22 200 tablet 0  Multiple Vitamin (MULTIVITAMIN WITH MINERALS) TABS tablet Take 1 tablet by mouth daily.     naloxone (NARCAN) nasal spray 4 mg/0.1 mL To use if pt develops unconsciousness or confusion that family thinks is related to opioids. 1 each 3   OLANZapine (ZYPREXA) 10 MG tablet Take 10 mg by mouth at bedtime.     OLANZapine (ZYPREXA) 2.5 MG tablet Take 2.5 mg by mouth in the morning and at bedtime.     OLANZapine (ZYPREXA) 5 MG tablet Take by mouth.     oxybutynin (DITROPAN) 5 MG tablet Take 5 mg by mouth See admin instructions. Take 5 mg by mouth three times a day and an additional 5 mg once daily as needed for urinary  urgency     PARoxetine (PAXIL) 40 MG tablet TAKE 1 TABLET BY MOUTH EVERYDAY AT BEDTIME 90 tablet 2   Probiotic Product (PROBIOTIC COLON SUPPORT) CAPS Take 1 capsule by mouth daily.     promethazine (PHENERGAN) 12.5 MG tablet Take 1 tablet (12.5 mg total) by mouth every 6 (six) hours as needed for nausea, vomiting or refractory nausea / vomiting. 270 tablet 1   senna-docusate (SENOKOT-S) 8.6-50 MG tablet Take 2 tablets by mouth 3 (three) times daily.     ciprofloxacin (CIPRO) 500 MG tablet Take 1 tablet (500 mg total) by mouth 2 (two) times daily. 60 tablet 5   vancomycin (VANCOCIN) 125 MG capsule Take 1 capsule (125 mg total) by mouth in the morning and at bedtime. 60 capsule 11   No facility-administered medications prior to visit.     Past Medical History:  Diagnosis Date   Acute on chronic respiratory failure with hypoxia (HCC) 06/2018   trach removed 11-16-2018, on vent from jan until may 2020 - uses albuterol prn   Anxiety    Bacteremia due to Pseudomonas 06/2018   Chronic osteomyelitis (HCC)    Chronic pain syndrome    Clostridium difficile colitis 10/30/2019   tx with abx    Depression    DVT (deep venous thrombosis) (HCC) 2020   right brachial post PICC line   History of blood transfusion 06/2018   History of Clostridioides difficile colitis    History of kidney stones    Hypertension    norvasc d/c by pcp on 11/05/19   Multiple traumatic injuries    Penile pain 11/18/2019   Pneumonia 11/2009   2020 x 2   Walker as ambulation aid    Wheelchair bound    electric   Wound discharge    left hip wound with bloody/clear drainage change dressing q day surgilube with gauze, between legs wound using calcium algenate pad bid     Past Surgical History:  Procedure Laterality Date   APPLICATION OF A-CELL OF BACK N/A 08/06/2018   Procedure: Application Of A-Cell Of Back;  Surgeon: Peggye Form, DO;  Location: [facility];  Service: Plastics;  Laterality: N/A;   APPLICATION OF  A-CELL OF EXTREMITY Left 08/06/2018   Procedure: Application Of A-Cell Of Extremity;  Surgeon: Peggye Form, DO;  Location: [facility];  Service: Plastics;  Laterality: Left;   APPLICATION OF A-CELL OF EXTREMITY Left 09/18/2019   Procedure: APPLICATION OF A-CELL OF EXTREMITY;  Surgeon: Peggye Form, DO;  Location: [facility];  Service: Plastics;  Laterality: Left;   APPLICATION OF WOUND VAC  07/12/2018   Procedure: Application Of Wound Vac to the Left Thigh and Scrotum.;  Surgeon: Roby Lofts, MD;  Location: [facility];  Service: Orthopedics;;  APPLICATION OF WOUND VAC  07/10/2018   Procedure: Application Of Wound Vac;  Surgeon: Berna Bue, MD;  Location: [facility] OR;  Service: General;;   COLON SURGERY  2020   colostomy   COLOSTOMY N/A 07/23/2018   Procedure: COLOSTOMY;  Surgeon: Violeta Gelinas, MD;  Location: [facility] OR;  Service: General;  Laterality: N/A;   CYSTOSCOPY W/ URETERAL STENT PLACEMENT N/A 07/15/2018   Procedure: RETROGRADE URETHROGRAM;  Surgeon: Marcine Matar, MD;  Location: [facility] OR;  Service: Urology;  Laterality: N/A;   CYSTOSCOPY WITH LITHOLAPAXY N/A 05/06/2019   Procedure: CYSTOSCOPY BASKET BLADDER STONE EXTRACTION;  Surgeon: Malen Gauze, MD;  Location: [facility];  Service: Urology;  Laterality: N/A;  30 MINS   CYSTOSTOMY N/A 05/06/2019   Procedure: REPLACEMENT OF SUPRAPUBIC CATHETER;  Surgeon: Malen Gauze, MD;  Location: [facility];  Service: Urology;  Laterality: N/A;   DEBRIDEMENT AND CLOSURE WOUND Left 03/04/2019   Procedure: Excision of hip wound with placement of Acell;  Surgeon: Peggye Form, DO;  [facility];  Service: Plastics;  Laterality: Left;   ESOPHAGOGASTRODUODENOSCOPY N/A 08/14/2018   Procedure: ESOPHAGOGASTRODUODENOSCOPY (EGD);  Surgeon: Violeta Gelinas, MD;  Location: [facility] ENDOSCOPY;  Service: General;  Laterality: N/A;  bedside   FACIAL RECONSTRUCTION SURGERY     X 2--once as a teenager and second time  in his 30's   HARDWARE REMOVAL Left 03/04/2019   Procedure: Left Hip Hardware Removal;  Surgeon: Roby Lofts, MD;  [facility];  Service: Orthopedics;  Laterality: Left;   HIP PINNING,CANNULATED Left 07/12/2018   Procedure: CANNULATED HIP PINNING;  Surgeon: Roby Lofts, MD;  [facility];  Service: Orthopedics;  Laterality: Left;   HIP SURGERY     HOLMIUM LASER APPLICATION Right 07/15/2019   Procedure: HOLMIUM LASER APPLICATION;  Surgeon: Malen Gauze, MD;  Location[facility];  Service: Urology;  Laterality: Right;   I & D EXTREMITY Left 07/25/2018   Procedure: Debridement of buttock, scrotum and left leg, placement of acell and vac;  Surgeon: Peggye Form, DO;  Location: [facility];  Service: Plastics;  Laterality: Left;   I & D EXTREMITY N/A 08/06/2018   Procedure: Debridement of buttock, scrotum and left leg;  Surgeon: Peggye Form, DO;  [facility];  Service: Plastics;  Laterality: N/A;   I & D EXTREMITY N/A 08/13/2018   Procedure: Debridement of buttock, scrotum and left leg, placement of acell and vac;  Surgeon: Peggye Form, DO;  [facility]: [facility];  Service: Plastics;  Laterality: N/A;  90 min, please   INCISION AND DRAINAGE HIP Left 09/18/2019   Procedure: IRRIGATION AND DEBRIDEMENT HIP/ PELVIS WITH WOUND VAC PLACEMENT;  Surgeon: Roby Lofts, MD;  [facility];  Service: Orthopedics;  Laterality: Left;   INCISION AND DRAINAGE OF WOUND N/A 07/18/2018   Procedure: Debridement of left leg, buttocks and scrotal wound with placement of acell and Flexiseal;  Surgeon: Peggye Form, DO;  [facility]: [facility];  Service: Plastics;  Laterality: N/A;   INCISION AND DRAINAGE OF WOUND Left 08/29/2018   Procedure: Debridement of buttock, scrotum and left leg, placement of acell and vac;  Surgeon: Peggye Form, DO;  [facility]: [facility];  Service: Plastics;  Laterality: Left;  75 min, please   INCISION AND DRAINAGE OF WOUND Bilateral 10/23/2018    Procedure: DEBRIDEMENT OF BUTTOCK,SCROTUM, AND LEG WOUNDS WITH PLACEMENT OF ACELL- BILATERAL 90 MIN;  Surgeon: Peggye Form,  DO;  [facility]: [facility];  Service: Plastics;  Laterality: Bilateral;   IR ANGIOGRAM PELVIS SELECTIVE OR SUPRASELECTIVE  07/10/2018   IR ANGIOGRAM PELVIS SELECTIVE OR SUPRASELECTIVE  07/10/2018   IR ANGIOGRAM SELECTIVE EACH ADDITIONAL VESSEL  07/10/2018   IR EMBO ART  VEN HEMORR LYMPH EXTRAV  INC GUIDE ROADMAPPING  07/10/2018   IR GASTROSTOMY TUBE MOD SED  01/21/2021   IR NEPHROSTOMY PLACEMENT LEFT  04/05/2019   IR NEPHROSTOMY PLACEMENT RIGHT  05/31/2019   IR US GUIDE BX ASP/DRAIN  07/10/2018   IR US GUIDE VASC ACCESS RIGHT  07/10/2018   IR VENO/EXT/UNI LEFT  07/10/2018   IRRIGATION AND DEBRIDEMENT OF WOUND WITH SPLIT THICKNESS SKIN GRAFT Left 09/19/2018   Procedure: Debridement of gluteal wound with placement of acell to left leg.;  Surgeon: Peggye Form, DO;  [facility];  Service: Plastics;  Laterality: Left;  2.5 hours, please   LAPAROTOMY N/A 07/12/2018   Procedure: EXPLORATORY LAPAROTOMY;  Surgeon: Violeta Gelinas, MD;  Location[facility] OR;  Service: General;  Laterality: N/A;   LAPAROTOMY N/A 07/15/2018   Procedure: WOUND EXPLORATION; CLOSURE OF ABDOMEN;  Surgeon: Violeta Gelinas, MD;  Location: [facility] OR;  Service: General;  Laterality: N/A;   LAPAROTOMY  07/10/2018   Procedure: Exploratory Laparotomy;  Surgeon: Berna Bue, MD;  Location: [facility] OR;  Service: General;;   MASS EXCISION Left 09/18/2019   Procedure: EXCISION UPPER LEFT INNER THIGH WOUND;  Surgeon: Peggye Form, DO;  [facility];  Service: Plastics;  Laterality: Left;   NEPHROLITHOTOMY Right 07/15/2019   Procedure: NEPHROLITHOTOMY PERCUTANEOUS;  Surgeon: Malen Gauze, MD;  Location: [facility];  Service: Urology;  Laterality: Right;  90 MINS   PEG PLACEMENT N/A 08/14/2018   Procedure: PERCUTANEOUS ENDOSCOPIC GASTROSTOMY (PEG) PLACEMENT;  Surgeon: Violeta Gelinas, MD;   Location: [facility] ENDOSCOPY;  Service: General;  Laterality: N/A;   PERCUTANEOUS TRACHEOSTOMY N/A 08/02/2018   Procedure: PERCUTANEOUS TRACHEOSTOMY;  Surgeon: Violeta Gelinas, MD;  Location: [facility] OR;  Service: General;  Laterality: N/A;   RADIOLOGY WITH ANESTHESIA N/A 07/10/2018   Procedure: IR WITH ANESTHESIA;  Surgeon: Simonne Come, MD;  Location: [facility] OR;  Service: Radiology;  Laterality: N/A;   RADIOLOGY WITH ANESTHESIA Right 07/10/2018   Procedure: Ir With Anesthesia;  Surgeon: Simonne Come, MD;  Location: [facility] OR;  Service: Radiology;  Laterality: Right;   SCROTAL EXPLORATION N/A 07/15/2018   Procedure: SCROTUM DEBRIDEMENT;  Surgeon: Marcine Matar, MD;  Location: [facility] OR;  Service: Urology;  Laterality: N/A;   SHOULDER SURGERY     SKIN SPLIT GRAFT Right 09/19/2018   Procedure: Skin Graft Split Thickness;  Surgeon: Peggye Form, DO;  Location[facility];  Service: Plastics;  Laterality: Right;   SKIN SPLIT GRAFT N/A 10/03/2018   Procedure: Split thickness skin graft to gluteal area with acell placement;  Surgeon: Peggye Form, DO;  Location: [facility] OR;  Service: Plastics;  Laterality: N/A;  3 hours, please   VACUUM ASSISTED CLOSURE CHANGE N/A 07/12/2018   Procedure: ABDOMINAL VACUUM ASSISTED CLOSURE CHANGE and abdominal washout;  Surgeon: Violeta Gelinas, MD;  Location: [facility] OR;  Service: General;  Laterality: N/A;   WOUND DEBRIDEMENT Left 07/23/2018   Procedure: DEBRIDEMENT LEFT BUTTOCK  WOUND;  Surgeon: Violeta Gelinas, MD;  Location: [facility] OR;  Service: General;  Laterality: Left;   WOUND EXPLORATION Left 07/10/2018   Procedure: WOUND EXPLORATION LEFT GROIN;  Surgeon: Berna Bue, MD;  Location: [facility] OR;  Service: General;  Laterality: Left;  Review of Systems  Constitutional:  Negative for chills, diaphoresis, fatigue and fever.  Respiratory:  Negative for cough, chest tightness, shortness of breath and wheezing.   Cardiovascular:  Negative for chest pain.  Gastrointestinal:  Negative  for abdominal pain, diarrhea, nausea and vomiting.      Objective:    BP 107/74   Pulse (!) 104   Temp (!) 97.3 F (36.3 C) (Oral)   SpO2 90%  Nursing note and vital signs reviewed.  Physical Exam Constitutional:      General: He is not in acute distress.    Appearance: He is well-developed.  Cardiovascular:     Rate and Rhythm: Normal rate and regular rhythm.     Heart sounds: Normal heart sounds.  Pulmonary:     Effort: Pulmonary effort is normal.     Breath sounds: Normal breath sounds.  Skin:    General: Skin is warm and dry.  Neurological:     Mental Status: He is alert and oriented to person, place, and time.         09/30/2022   10:51 AM 08/17/2022    9:21 AM 07/25/2022   10:34 AM 06/17/2022   12:54 PM 03/31/2022   12:36 PM  Depression screen PHQ 2/9  Decreased Interest 2 1 0 0   Down, Depressed, Hopeless 3 1 0 0 3  PHQ - 2 Score 5 2 0 0 3  Altered sleeping 3    3  Tired, decreased energy 3    2  Change in appetite 3    3  Feeling bad or failure about yourself  3    3  Trouble concentrating 3    3  Moving slowly or fidgety/restless 1    2  Suicidal thoughts 0    0  PHQ-9 Score 21    19  Difficult doing work/chores Very difficult    Very difficult       Assessment & Plan:    Patient Active Problem List   Diagnosis Date Noted   Therapeutic drug monitoring 10/27/2022   Bladder spasms 10/03/2022   Leg edema 09/30/2022   Bilateral inguinal hernia without obstruction or gangrene 07/23/2022   Encounter for care related to feeding tube 07/23/2022   Irritant contact dermatitis associated with fecal stoma 07/23/2022   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   Memory deficits 01/12/2022   Hypnagogic jerks 09/22/2021   Neuromuscular weakness (HCC) 06/24/2021   Depression, major, single episode, moderate (HCC) 06/24/2021   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   Chronic osteomyelitis of hip (HCC) 11/22/2020   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/22/2020    Dysphagia 07/02/2020   Therapeutic opioid induced constipation 04/11/2020   Status post peripherally inserted central catheter (PICC) central line placement 03/17/2020   Skin-picking disorder 03/12/2020   Recurrent pain of right knee 11/08/2019   Essential hypertension 11/05/2019   Myofascial pain dysfunction syndrome 08/05/2019   Complicated urinary tract infection 05/30/2019   Wound infection, posttraumatic 02/28/2019   Hydronephrosis concurrent with and due to calculi of kidney and ureter 02/28/2019   GERD (gastroesophageal reflux disease) 02/28/2019   Suprapubic catheter (HCC) 02/28/2019   Anxiety 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 01/29/2019   Chronic pain due to trauma 01/29/2019   Neuropathic pain 01/29/2019   Colostomy status (HCC) 01/14/2019   Insomnia 01/14/2019   Current use of proton pump inhibitor 01/14/2019   Wound of buttock 12/28/2018   Anxiety and depression 12/17/2018   Debility 11/23/2018   Chronic pain  syndrome    Multiple traumatic injuries    Pressure injury of skin 08/13/2018   Urethral injury 07/13/2018     Problem List Items Addressed This Visit       Musculoskeletal and Integument   Chronic osteomyelitis of hip (HCC) - Primary    Lillard is looking well today with good adherence and tolerance to ciprofloxacin. Wounds are waxing and waning as before. Discussed that wounds are unlikely to heal in the presence of infection. Infection appears to be suppressed at this point. Will continue with current dose of ciprofloxacin and oral vancomycin for C. Diff prophylaxis. Message sent to pharmacy regarding need to continue with ciprofloxacin indefinitely secondary to Pseudomonas infection. Plan for follow up in 6 months or sooner if needed.       Relevant Medications   ciprofloxacin (CIPRO) 500 MG tablet   vancomycin (VANCOCIN) 125 MG capsule     Other   Therapeutic drug monitoring    Shivansh's renal function is stable and there is no evidence of  tendinopathy or diarrhea. Remains on oral vancomycin for prophylaxis from C. Difficile with long term flouroquinolone use.       Other Visit Diagnoses     Pseudomonas infection       Relevant Medications   vancomycin (VANCOCIN) 125 MG capsule        I am having Joell B. Bega maintain his multivitamin with minerals, melatonin, Blood Pressure Cuff, oxybutynin, naloxone, dextromethorphan-guaiFENesin, Probiotic Colon Support, senna-docusate, famotidine, methocarbamol, LORazepam, OLANZapine, OLANZapine, gabapentin, OLANZapine, lidocaine, Breztri Aerosphere, albuterol, imipramine, PARoxetine, furosemide, fentaNYL, morphine, hydrochlorothiazide, promethazine, Linzess, Gemtesa, ciprofloxacin, and vancomycin.   Meds ordered this encounter  Medications   ciprofloxacin (CIPRO) 500 MG tablet    Sig: Take 1 tablet (500 mg total) by mouth 2 (two) times daily.    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 Spoke with pharmacist at this store regarding this.    Order Specific Question:   Supervising Provider    Answer:   Judyann Munson [[id number]]   vancomycin (VANCOCIN) 125 MG capsule    Sig: Take 1 capsule (125 mg total) by mouth in the morning and at bedtime.    Dispense:  60 capsule    Refill:  11    Order Specific Question:   Supervising Provider    Answer:   Judyann Munson [[id number]]     Follow-up: Return in about 6 months (around 04/29/2023), or if symptoms worsen or fail to improve.   Marcos Eke, MSN, FNP-C Nurse Practitioner [facility] Main number: [phone]

## 2022-10-29 ENCOUNTER — Encounter: Payer: Self-pay | Admitting: Gastroenterology

## 2022-10-29 DIAGNOSIS — K316 Fistula of stomach and duodenum: Secondary | ICD-10-CM | POA: Insufficient documentation

## 2022-10-29 DIAGNOSIS — Z9189 Other specified personal risk factors, not elsewhere classified: Secondary | ICD-10-CM | POA: Insufficient documentation

## 2022-10-31 ENCOUNTER — Ambulatory Visit (INDEPENDENT_AMBULATORY_CARE_PROVIDER_SITE_OTHER): Payer: PRIVATE HEALTH INSURANCE

## 2022-10-31 DIAGNOSIS — R600 Localized edema: Secondary | ICD-10-CM | POA: Diagnosis not present

## 2022-10-31 DIAGNOSIS — R030 Elevated blood-pressure reading, without diagnosis of hypertension: Secondary | ICD-10-CM

## 2022-10-31 LAB — ECHOCARDIOGRAM COMPLETE
Area-P 1/2: 7.44 cm2
Calc EF: 55.4 %
S' Lateral: 2.53 cm
Single Plane A2C EF: 61.9 %
Single Plane A4C EF: 47.1 %

## 2022-11-01 ENCOUNTER — Encounter: Payer: Self-pay | Admitting: Family Medicine

## 2022-11-01 ENCOUNTER — Ambulatory Visit (INDEPENDENT_AMBULATORY_CARE_PROVIDER_SITE_OTHER): Payer: PRIVATE HEALTH INSURANCE | Admitting: Family Medicine

## 2022-11-01 VITALS — BP 124/84 | HR 101 | Temp 98.0°F

## 2022-11-01 DIAGNOSIS — R600 Localized edema: Secondary | ICD-10-CM | POA: Diagnosis not present

## 2022-11-01 DIAGNOSIS — I10 Essential (primary) hypertension: Secondary | ICD-10-CM

## 2022-11-01 NOTE — Progress Notes (Signed)
Discussed at office visit today.  Normal EF.

## 2022-11-01 NOTE — Assessment & Plan Note (Signed)
Much better controlled today on HCTZ 12.5 mg daily.  Will recheck B-Met today.  Will continue current regimen with HCTZ and Lasix 40 mg daily.  This is working well for his lower extremity edema.  We did discuss potassium supplementation.  He will try to eat foods high in potassium such as fresh fruits and veggies including bananas.  May need to add on potassium supplement going forward to start statin more issues with hypokalemia.  We did discuss recent echocardiogram which showed normal EF.

## 2022-11-01 NOTE — Progress Notes (Signed)
   Christopher [name] is a 57 y.o. male who presents today for an office visit.  Assessment/Plan:  New/Acute Problems: Fall Now doing better. Suffered a mechanical fall at home.  Does not have any current pain that is outside his normal.  No red flag signs or symptoms.  He will let us know if anything changes.  Will give DME order for walking roller.  He does have his routine follow-up with physical medicine planned.  May consider referral to PT if has recurrent falls.  Chronic Problems Addressed Today: Essential hypertension Much better controlled today on HCTZ 12.5 mg daily.  Will recheck B-Met today.  Will continue current regimen with HCTZ and Lasix 40 mg daily.  This is working well for his lower extremity edema.  We did discuss potassium supplementation.  He will try to eat foods high in potassium such as fresh fruits and veggies including bananas.  May need to add on potassium supplement going forward to start statin more issues with hypokalemia.  We did discuss recent echocardiogram which showed normal EF.   Leg edema Much better on HCTZ 12.5 mg daily and Lasix 40 mg daily.  Also discussed importance of leg elevation and salt avoidance.  We did also discuss potassium supplementation as above.  Recheck BMI today.  Recent echocardiogram was normal.  We discussed this in the office today.     Subjective:  HPI:  See A/P for status of chronic conditions.  Patient is here for follow-up.  We saw him about a month ago.  At that time was having elevated blood pressure readings and we started him on HCTZ 12.5 mg daily.  He is doing well with this.  Home readings have been in the 120s over 80s.  No significant side effects of medications.  Swelling is much better in lower extremities as well.  He is additionally on Lasix 40 mg daily.  Tolerating this very well.  He fell last night while trying to move around at home. Landed on his sacral wound. He did have some bleeding but this resolved. Pain  has improved.        Objective:  Physical Exam: BP 124/84   Pulse (!) 101   Temp 98 F (36.7 C) (Temporal)   SpO2 94%   Gen: No acute distress, resting comfortably CV: Regular rate and rhythm with no murmurs appreciated Pulm: Normal work of breathing, clear to auscultation bilaterally with no crackles, wheezes, or rhonchi Neuro: in wheelchair,  MUSCULOSKELETAL: - Legs: Trace pretibial edema bilaterally. Psych: Normal affect and thought content      Christopher Miera M. [name], MD 11/01/2022 11:29 AM

## 2022-11-01 NOTE — Assessment & Plan Note (Signed)
Much better on HCTZ 12.5 mg daily and Lasix 40 mg daily.  Also discussed importance of leg elevation and salt avoidance.  We did also discuss potassium supplementation as above.  Recheck BMI today.  Recent echocardiogram was normal.  We discussed this in the office today.

## 2022-11-01 NOTE — Patient Instructions (Addendum)
It was very nice to see you today!  I am glad that you are doing well!  Your blood pressure looks much better today.  Will check blood work.  Please make sure that you are eating foods high in potassium.  Return in about 6 months (around 05/04/2023) for Follow Up.   Take care, Dr [name]  PLEASE NOTE:  If you had any lab tests, please let us know if you have not heard back within a few days. You may see your results on mychart before we have a chance to review them but we will give you a call once they are reviewed by [place].   If we ordered any referrals today, please let us know if you have not heard from their office within the next week.   If you had any urgent prescriptions sent in today, please check with the pharmacy within an hour of our visit to make sure the prescription was transmitted appropriately.   Please try these tips to maintain a healthy lifestyle:  Eat at least 3 REAL meals and 1-2 snacks per day.  Aim for no more than 5 hours between eating.  If you eat breakfast, please do so within one hour of getting up.   Each meal should contain half fruits/vegetables, one quarter protein, and one quarter carbs (no bigger than a computer mouse)  Cut down on sweet beverages. This includes juice, soda, and sweet tea.   Drink at least 1 glass of water with each meal and aim for at least 8 glasses per day  Exercise at least 150 minutes every week.

## 2022-11-02 ENCOUNTER — Ambulatory Visit (HOSPITAL_BASED_OUTPATIENT_CLINIC_OR_DEPARTMENT_OTHER): Payer: No Typology Code available for payment source | Admitting: Anesthesiology

## 2022-11-02 ENCOUNTER — Encounter (HOSPITAL_COMMUNITY)
Admission: RE | Disposition: A | Discharge status: Home / Self Care | Payer: Self-pay | Source: Home / Self Care | Attending: Gastroenterology

## 2022-11-02 ENCOUNTER — Encounter (HOSPITAL_COMMUNITY): Payer: Self-pay | Admitting: Gastroenterology

## 2022-11-02 ENCOUNTER — Ambulatory Visit: Payer: PRIVATE HEALTH INSURANCE | Admitting: Psychology

## 2022-11-02 ENCOUNTER — Ambulatory Visit (HOSPITAL_COMMUNITY)
Admission: RE | Admit: 2022-11-02 | Discharge: 2022-11-02 | Disposition: A | Discharge status: Home / Self Care | Payer: No Typology Code available for payment source | Attending: Gastroenterology | Admitting: Gastroenterology

## 2022-11-02 ENCOUNTER — Other Ambulatory Visit: Payer: Self-pay

## 2022-11-02 ENCOUNTER — Telehealth: Payer: Self-pay

## 2022-11-02 ENCOUNTER — Ambulatory Visit (HOSPITAL_COMMUNITY): Payer: No Typology Code available for payment source | Admitting: Anesthesiology

## 2022-11-02 DIAGNOSIS — Z933 Colostomy status: Secondary | ICD-10-CM | POA: Insufficient documentation

## 2022-11-02 DIAGNOSIS — Z9981 Dependence on supplemental oxygen: Secondary | ICD-10-CM | POA: Insufficient documentation

## 2022-11-02 DIAGNOSIS — K632 Fistula of intestine: Secondary | ICD-10-CM | POA: Insufficient documentation

## 2022-11-02 DIAGNOSIS — K316 Fistula of stomach and duodenum: Secondary | ICD-10-CM

## 2022-11-02 DIAGNOSIS — K449 Diaphragmatic hernia without obstruction or gangrene: Secondary | ICD-10-CM | POA: Diagnosis not present

## 2022-11-02 DIAGNOSIS — K3189 Other diseases of stomach and duodenum: Secondary | ICD-10-CM

## 2022-11-02 DIAGNOSIS — Z86718 Personal history of other venous thrombosis and embolism: Secondary | ICD-10-CM | POA: Insufficient documentation

## 2022-11-02 DIAGNOSIS — J449 Chronic obstructive pulmonary disease, unspecified: Secondary | ICD-10-CM | POA: Diagnosis not present

## 2022-11-02 DIAGNOSIS — G894 Chronic pain syndrome: Secondary | ICD-10-CM | POA: Insufficient documentation

## 2022-11-02 DIAGNOSIS — G8220 Paraplegia, unspecified: Secondary | ICD-10-CM | POA: Insufficient documentation

## 2022-11-02 DIAGNOSIS — Z87891 Personal history of nicotine dependence: Secondary | ICD-10-CM | POA: Diagnosis not present

## 2022-11-02 DIAGNOSIS — N319 Neuromuscular dysfunction of bladder, unspecified: Secondary | ICD-10-CM | POA: Diagnosis not present

## 2022-11-02 DIAGNOSIS — D3501 Benign neoplasm of right adrenal gland: Secondary | ICD-10-CM | POA: Diagnosis not present

## 2022-11-02 DIAGNOSIS — K2289 Other specified disease of esophagus: Secondary | ICD-10-CM

## 2022-11-02 DIAGNOSIS — F32A Depression, unspecified: Secondary | ICD-10-CM | POA: Diagnosis not present

## 2022-11-02 DIAGNOSIS — Z993 Dependence on wheelchair: Secondary | ICD-10-CM | POA: Diagnosis not present

## 2022-11-02 DIAGNOSIS — I10 Essential (primary) hypertension: Secondary | ICD-10-CM | POA: Diagnosis not present

## 2022-11-02 DIAGNOSIS — F418 Other specified anxiety disorders: Secondary | ICD-10-CM

## 2022-11-02 DIAGNOSIS — F419 Anxiety disorder, unspecified: Secondary | ICD-10-CM | POA: Diagnosis not present

## 2022-11-02 DIAGNOSIS — K219 Gastro-esophageal reflux disease without esophagitis: Secondary | ICD-10-CM | POA: Insufficient documentation

## 2022-11-02 DIAGNOSIS — K209 Esophagitis, unspecified without bleeding: Secondary | ICD-10-CM | POA: Insufficient documentation

## 2022-11-02 DIAGNOSIS — M8660 Other chronic osteomyelitis, unspecified site: Secondary | ICD-10-CM | POA: Diagnosis not present

## 2022-11-02 DIAGNOSIS — I700 Atherosclerosis of aorta: Secondary | ICD-10-CM | POA: Insufficient documentation

## 2022-11-02 HISTORY — PX: BIOPSY: SHX5522

## 2022-11-02 HISTORY — PX: HOT HEMOSTASIS: SHX5433

## 2022-11-02 HISTORY — PX: ESOPHAGOGASTRODUODENOSCOPY (EGD) WITH PROPOFOL: SHX5813

## 2022-11-02 HISTORY — PX: HEMOSTASIS CLIP PLACEMENT: SHX6857

## 2022-11-02 HISTORY — PX: SAVORY DILATION: SHX5439

## 2022-11-02 SURGERY — ESOPHAGOGASTRODUODENOSCOPY (EGD) WITH PROPOFOL
Anesthesia: General

## 2022-11-02 MED ORDER — SUGAMMADEX SODIUM 200 MG/2ML IV SOLN
INTRAVENOUS | Status: DC | PRN
  Administered 2022-11-02: 200 mg via INTRAVENOUS

## 2022-11-02 MED ORDER — CIPROFLOXACIN IN D5W 400 MG/200ML IV SOLN
INTRAVENOUS | Status: DC | PRN
  Administered 2022-11-02: 400 mg via INTRAVENOUS

## 2022-11-02 MED ORDER — PROPOFOL 1000 MG/100ML IV EMUL
INTRAVENOUS | Status: AC
  Filled 2022-11-02: qty 100

## 2022-11-02 MED ORDER — LABETALOL HCL 5 MG/ML IV SOLN: 5.0000 mg | Freq: Once | INTRAVENOUS | Status: DC

## 2022-11-02 MED ORDER — PROPOFOL 500 MG/50ML IV EMUL
INTRAVENOUS | Status: AC
  Filled 2022-11-02: qty 50

## 2022-11-02 MED ORDER — PANTOPRAZOLE SODIUM 40 MG PO TBEC: 40.0000 mg | DELAYED_RELEASE_TABLET | Freq: Every day | ORAL | 3 refills | Status: DC

## 2022-11-02 MED ORDER — PROPOFOL 10 MG/ML IV BOLUS
INTRAVENOUS | Status: AC
  Filled 2022-11-02: qty 20

## 2022-11-02 MED ORDER — LIDOCAINE HCL (CARDIAC) PF 100 MG/5ML IV SOSY
PREFILLED_SYRINGE | INTRAVENOUS | Status: DC | PRN
  Administered 2022-11-02: 60 mg via INTRAVENOUS

## 2022-11-02 MED ORDER — CIPROFLOXACIN IN D5W 400 MG/200ML IV SOLN
INTRAVENOUS | Status: AC
  Filled 2022-11-02: qty 200

## 2022-11-02 MED ORDER — ALBUTEROL SULFATE (2.5 MG/3ML) 0.083% IN NEBU
2.5000 mg | INHALATION_SOLUTION | RESPIRATORY_TRACT | Status: AC
  Administered 2022-11-02: 2.5 mg via RESPIRATORY_TRACT

## 2022-11-02 MED ORDER — ALBUTEROL SULFATE (2.5 MG/3ML) 0.083% IN NEBU
INHALATION_SOLUTION | RESPIRATORY_TRACT | Status: AC
  Filled 2022-11-02: qty 3

## 2022-11-02 MED ORDER — SODIUM CHLORIDE 0.9 % IV SOLN: INTRAVENOUS | Status: DC

## 2022-11-02 MED ORDER — LACTATED RINGERS IV SOLN: INTRAVENOUS | Status: DC | PRN

## 2022-11-02 MED ORDER — ONDANSETRON HCL 4 MG/2ML IJ SOLN
INTRAMUSCULAR | Status: DC | PRN
  Administered 2022-11-02: 4 mg via INTRAVENOUS

## 2022-11-02 MED ORDER — LACTATED RINGERS IV SOLN: INTRAVENOUS | Status: DC

## 2022-11-02 MED ORDER — LABETALOL HCL 5 MG/ML IV SOLN
INTRAVENOUS | Status: AC
  Filled 2022-11-02: qty 4

## 2022-11-02 MED ORDER — ROCURONIUM BROMIDE 100 MG/10ML IV SOLN
INTRAVENOUS | Status: DC | PRN
  Administered 2022-11-02: 50 mg via INTRAVENOUS

## 2022-11-02 MED ORDER — PROPOFOL 10 MG/ML IV BOLUS
INTRAVENOUS | Status: DC | PRN
  Administered 2022-11-02: 150 mg via INTRAVENOUS

## 2022-11-02 SURGICAL SUPPLY — 15 items

## 2022-11-02 NOTE — Telephone Encounter (Signed)
KUB has been entered  Follow up has been scheduled for 02/08/23 at 8:40 am with Dr Myrtie Neither.     The pt caregiver has been advised and will have him come in 4-6 weeks for xray and keep appt as planned.

## 2022-11-02 NOTE — Anesthesia Postprocedure Evaluation (Signed)
Anesthesia Post Note  Patient: Christopher [name]  Procedure(s) Performed: ESOPHAGOGASTRODUODENOSCOPY (EGD) WITH PROPOFOL BIOPSY SAVORY DILATION HOT HEMOSTASIS (ARGON PLASMA COAGULATION/BICAP) HEMOSTASIS CLIP PLACEMENT     Patient location during evaluation: PACU Anesthesia Type: General Level of consciousness: awake and alert Pain management: pain level controlled Vital Signs Assessment: post-procedure vital signs reviewed and stable Respiratory status: spontaneous breathing, nonlabored ventilation, respiratory function stable and patient connected to nasal cannula oxygen Cardiovascular status: blood pressure returned to baseline, stable and tachycardic Postop Assessment: no apparent nausea or vomiting Anesthetic complications: no   No notable events documented.  Last Vitals:  Vitals:   11/02/22 1025 11/02/22 1030  BP:  (!) 161/88  Pulse: (!) 107 (!) 106  Resp: 11 10  Temp:    SpO2: 100% 100%    Last Pain:  Vitals:   11/02/22 1025  TempSrc:   PainSc: 0-No pain                 Beryle Lathe

## 2022-11-02 NOTE — Progress Notes (Signed)
Pt in recover, HR 108-114, noted ST in preop. Audible wheezing, BP 174/116, 189/119. 02 sats 99-100% 4 L simple face mask, trial via [place] desat to 89%. Encouraged cough and deep breathing and utilizing incentive spirometer. Dr. Mal Amabile anesthesia notified. Order for albuterol neb treatment and 5 mg labetalol IVP. After order received for labetalol BP reading 136/89 and 154/92 pulse 105. Labetalol on hold at  this time will followup with Dr. Mal Amabile. KMB

## 2022-11-02 NOTE — Anesthesia Preprocedure Evaluation (Addendum)
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    Endo/Other  negative endocrine ROS    Renal/GU negative Renal ROS Bladder dysfunction      Musculoskeletal negative musculoskeletal ROS (+)  narcotic dependent  Abdominal   Peds  Hematology negative hematology ROS (+)   Anesthesia Other Findings Chronic pain  Reproductive/Obstetrics                              Anesthesia Physical Anesthesia Plan  ASA: 3  Anesthesia Plan: General   Post-op Pain Management:    Induction: Intravenous  PONV Risk Score and Plan: 2 and Propofol infusion and Treatment may vary due to age or medical condition  Airway Management Planned: Oral ETT and Video Laryngoscope Planned  Additional Equipment: None  Intra-op Plan:   Post-operative Plan: Extubation in OR  Informed Consent: I have reviewed the patients History and Physical, chart, labs and discussed the procedure including the risks, benefits and alternatives for the proposed anesthesia with the patient or authorized representative who has indicated his/her understanding and acceptance.     Dental advisory  given  Plan Discussed with: CRNA and Anesthesiologist  Anesthesia Plan Comments:          Anesthesia Quick Evaluation

## 2022-11-02 NOTE — Transfer of Care (Signed)
Immediate Anesthesia Transfer of Care Note  Patient: Christopher [name]  Procedure(s) Performed: ESOPHAGOGASTRODUODENOSCOPY (EGD) WITH PROPOFOL BIOPSY SAVORY DILATION HOT HEMOSTASIS (ARGON PLASMA COAGULATION/BICAP) HEMOSTASIS CLIP PLACEMENT  Patient Location: PACU and Endoscopy Unit  Anesthesia Type:General  Level of Consciousness: awake, alert , and oriented  Airway & Oxygen Therapy: Patient Spontanous Breathing and Patient connected to face mask oxygen  Post-op Assessment: Report given to RN and Post -op Vital signs reviewed and stable  Post vital signs: Reviewed and stable  Last Vitals:  Vitals Value Taken Time  BP 174/116 11/02/22 1007  Temp    Pulse 106 11/02/22 1009  Resp 15 11/02/22 1009  SpO2 99 % 11/02/22 1009  Vitals shown include unvalidated device data.  Last Pain:  Vitals:   11/02/22 0736  TempSrc: Temporal  PainSc: 0-No pain         Complications: No notable events documented.

## 2022-11-02 NOTE — Interval H&P Note (Signed)
History and Physical Interval Note:  11/02/2022 7:44 AM  Christopher [name]  has presented today for surgery, with the diagnosis of GC fistula.  The various methods of treatment have been discussed with the patient and family. After consideration of risks, benefits and other options for treatment, the patient has consented to  Procedure(s): ESOPHAGOGASTRODUODENOSCOPY (EGD) WITH PROPOFOL (N/A) as a surgical intervention.  The patient's history has been reviewed, patient examined, no change in status, stable for surgery.  I have reviewed the patient's chart and labs.  Questions were answered to the patient's satisfaction.     [employer]

## 2022-11-02 NOTE — Anesthesia Procedure Notes (Signed)
Procedure Name: Intubation Date/Time: 11/02/2022 9:12 AM  Performed by: Christopher [name], Christopher [name], CRNAPre-anesthesia Checklist: Patient identified, Emergency Drugs available, Suction available and Patient being monitored Patient Re-evaluated:Patient Re-evaluated prior to induction Oxygen Delivery Method: Circle system utilized Preoxygenation: Pre-oxygenation with 100% oxygen Induction Type: IV induction Ventilation: Mask ventilation without difficulty Laryngoscope Size: Mac, 4 and Glidescope Grade View: Grade I Tube type: Oral Tube size: 7.0 mm Number of attempts: 1 Airway Equipment and Method: Stylet, Oral airway and Video-laryngoscopy Placement Confirmation: ETT inserted through vocal cords under direct vision, positive ETCO2 and breath sounds checked- equal and bilateral Secured at: 22 cm Tube secured with: Tape Dental Injury: Teeth and Oropharynx as per pre-operative assessment

## 2022-11-02 NOTE — Op Note (Signed)
[facility] Patient Name: Christopher [name] Procedure Date: 11/02/2022 MRN: [id number] Attending MD: Corliss Parish , MD, [id number] Date of Birth: 1966/03/18 CSN: [id number] Age: 57 Admit Type: Outpatient Procedure:                Upper GI endoscopy Indications:              Enterocutaneous fistula Providers:                Corliss Parish, MD, Adolph Pollack, RN,                            Leanne Lovely, Technician, Stephanie [place],                            CRNA Referring MD:             Violeta Gelinas, MD, Starr [place]. Myrtie Neither, MD, Katina Degree.                            Parker Medicines:                General Anesthesia, Cipro 400 mg IV Complications:            No immediate complications. Estimated Blood Loss:     Estimated blood loss was minimal. Procedure:                Pre-Anesthesia Assessment:                           - Prior to the procedure, a History and Physical                            was performed, and patient medications and                            allergies were reviewed. The patient's tolerance of                            previous anesthesia was also reviewed. The risks                            and benefits of the procedure and the sedation                            options and risks were discussed with the patient.                            All questions were answered, and informed consent                            was obtained. Prior Anticoagulants: The patient has                            taken no anticoagulant or antiplatelet agents. ASA  Grade Assessment: III - A patient with severe                            systemic disease. After reviewing the risks and                            benefits, the patient was deemed in satisfactory                            condition to undergo the procedure.                           After obtaining informed consent, the endoscope was                             passed under direct vision. Throughout the                            procedure, the patient's blood pressure, pulse, and                            oxygen saturations were monitored continuously. The                            [id number]-[id number] ([id number]) Olympus therapeutic endoscope                            was introduced through the mouth, and advanced to                            the second part of duodenum. The upper GI endoscopy                            was accomplished without difficulty. The patient                            tolerated the procedure. Scope In: Scope Out: Findings:      White nummular lesions were noted in the proximal esophagus. Biopsies       were taken with a cold forceps for histology to rule out Candida.      No other gross lesions were noted in the entire esophagus, however       passage of the therapeutic endoscope through the UES was felt to be a       little bit tighter than normal (though this could have been from the ETT       in place). To ensure an adequate passage of the OVESCO, a guidewire was       placed and the scope was withdrawn. Dilation was performed with a Savary       dilator with moderate resistance at 18 mm. The dilation site was       examined following endoscope reinsertion and showed mild mucosal       disruption, mild improvement in luminal narrowing just below the UES and       no evidence of a perforation.  The Z-line was irregular and was found 42 cm from the incisors.      A 1 cm hiatal hernia was present.      A small fistula was found on the anterior wall of the gastric body. To       see if this was the active site of the GC fistula, placement of a short       0.035 inch Soft Al Pimple was attempted. This passed successfully through       the fistula and was visualized outside of the patient's body.       Subsequently in an attempt to try to close the [facility] fistula we proceeded       with coagulation for tissue destruction of the  region within the fistula       and rounded using argon plasma (gastric settings) to aid in stimulating       granulation tissue. To attempt to repair the defect/fistula, the tissue       edges were approximated and suction was applied subsequently one 12/6 GC       OVESCO over-the-scope clip was successfully placed (MR conditional).       Closure of the defect was successful. Clip manufacturer: [employer]       [facility]. There was no bleeding during, or at the end, of the procedure.      Patchy moderately erythematous mucosa without bleeding was found in the       entire examined stomach. Biopsies were taken with a cold forceps for       histology and Helicobacter pylori testing.      No gross lesions were noted in the duodenal bulb, in the first portion       of the duodenum and in the second portion of the duodenum. Impression:               - Proximal white numular lesions noted - biopsied                            to rule out Candida. No other gross lesions in the                            entire esophagus though there was mild resistance                            with passing the therapeutic endoscope through the                            UES so dilation was performed to ensure adequate                            size of esophagus for passage. A mucosal wrent                            noted just below the UES.                           - Z-line irregular, 42 cm from the incisors.                           -  1 cm hiatal hernia.                           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 Wire placed through it. APC applied to                            region. OVESCO 12/6 GC clip was placed with success.                           - Erythematous mucosa in the stomach. Biopsied.                           - No gross lesions in the duodenal bulb, in the                            first portion of the duodenum and in the second                             portion of the duodenum. Moderate Sedation:      Not Applicable - Patient had care per Anesthesia. Recommendation:           - The patient will be observed post-procedure,                            until all discharge criteria are met.                           - Discharge patient to home.                           - Patient has a contact number available for                            emergencies. The signs and symptoms of potential                            delayed complications were discussed with the                            patient. Return to normal activities tomorrow.                            Written discharge instructions were provided to the                            patient.                           - Full liquid diet today.                           - Initiate PPI  once daily to decrease acid                            secretion, hopefully this will also aid in the                            healing process of the gastrocutaneous fistula.                           - Over the next week to 2 weeks we should see                            complete abatement of drainage if this is                            successful.                           - In 4 to 6 weeks KUB 2 view to be obtained to                            evaluate for OVESCO clip.                           - Consider repeat endoscopy pending how patient is                            doing.                           - If this fails, consider 1 more attempt at [facility]                            closure versus sending to interventional radiology                            for attempt at plugging of the GC fistula.                           - Observe patient's clinical course.                           - The findings and recommendations were discussed                            with the patient.                           - The findings and recommendations were discussed                            with  the patient's family. Procedure Code(s):        --- Professional ---  16109, Esophagogastroduodenoscopy, flexible,                            transoral; with ablation of tumor(s), polyp(s), or                            other lesion(s) (includes pre- and post-dilation                            and guide wire passage, when performed)                           43239, 59, Esophagogastroduodenoscopy, flexible,                            transoral; with biopsy, single or multiple Diagnosis Code(s):        --- Professional ---                           K22.89, Other specified disease of esophagus                           K44.9, Diaphragmatic hernia without obstruction or                            gangrene                           K31.6, Fistula of stomach and duodenum                           K31.89, Other diseases of stomach and duodenum                           K63.2, Fistula of intestine CPT copyright 2022 American Medical Association. All rights reserved. The codes documented in this report are preliminary and upon coder review may  be revised to meet current compliance requirements. Corliss Parish, MD 11/02/2022 10:08:41 AM Number of Addenda: 0

## 2022-11-02 NOTE — Telephone Encounter (Signed)
-----   Message from Lemar Lofty., MD sent at 11/02/2022 11:31 AM EDT ----- Regarding: Follow-up HD and BT, I am hopeful that what we have been able to do today will be enough to close this small fistula.  Certainly willing to give him 1 more attempt if we see that he recurs. Will set him up a follow-up in clinic with myself or HD or one of our APP's in approximately 2 months to see where things stand, he knows to reach out to [place] if he is having issues before then. Thanks. GM  Tracy Gerken, Please schedule patient a KUB 2 view in 4 to 6 weeks for follow-up of OVESCO clip placement. Schedule follow-up in clinic with APP or HD or myself in 2 to 2.5 months. Thanks. GM

## 2022-11-03 ENCOUNTER — Other Ambulatory Visit: Payer: Self-pay | Admitting: Physical Medicine and Rehabilitation

## 2022-11-04 ENCOUNTER — Encounter: Payer: Self-pay | Admitting: Gastroenterology

## 2022-11-04 ENCOUNTER — Other Ambulatory Visit: Payer: Self-pay

## 2022-11-04 LAB — SURGICAL PATHOLOGY

## 2022-11-04 MED ORDER — FLUCONAZOLE 100 MG PO TABS: ORAL_TABLET | ORAL | 0 refills | Status: AC

## 2022-11-04 NOTE — Telephone Encounter (Signed)
Spoke with patient's wife who states they are no longer using mail order. She received a letter stating since he wasn't getting 90 supply on some rxs services discontinued.Please resend to [facility].

## 2022-11-07 ENCOUNTER — Encounter (HOSPITAL_COMMUNITY): Payer: Self-pay | Admitting: Gastroenterology
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
## 2022-11-14 ENCOUNTER — Encounter
Payer: No Typology Code available for payment source | Attending: Physical Medicine and Rehabilitation | Admitting: Physical Medicine and Rehabilitation

## 2022-11-14 ENCOUNTER — Encounter: Payer: Self-pay | Admitting: Physical Medicine and Rehabilitation

## 2022-11-14 VITALS — BP 128/86 | HR 97 | Ht 75.0 in

## 2022-11-14 DIAGNOSIS — Z9359 Other cystostomy status: Secondary | ICD-10-CM | POA: Diagnosis present

## 2022-11-14 DIAGNOSIS — R413 Other amnesia: Secondary | ICD-10-CM | POA: Diagnosis present

## 2022-11-14 DIAGNOSIS — T402X5A Adverse effect of other opioids, initial encounter: Secondary | ICD-10-CM

## 2022-11-14 DIAGNOSIS — M792 Neuralgia and neuritis, unspecified: Secondary | ICD-10-CM | POA: Diagnosis not present

## 2022-11-14 DIAGNOSIS — G894 Chronic pain syndrome: Secondary | ICD-10-CM | POA: Insufficient documentation

## 2022-11-14 DIAGNOSIS — N3289 Other specified disorders of bladder: Secondary | ICD-10-CM | POA: Diagnosis not present

## 2022-11-14 DIAGNOSIS — G8921 Chronic pain due to trauma: Secondary | ICD-10-CM

## 2022-11-14 DIAGNOSIS — K5903 Drug induced constipation: Secondary | ICD-10-CM | POA: Diagnosis not present

## 2022-11-14 DIAGNOSIS — Z993 Dependence on wheelchair: Secondary | ICD-10-CM | POA: Diagnosis present

## 2022-11-14 MED ORDER — MORPHINE SULFATE 15 MG PO TABS: 15.0000 mg | ORAL_TABLET | ORAL | 0 refills | Status: DC | PRN

## 2022-11-14 MED ORDER — FENTANYL 75 MCG/HR TD PT72: 1.0000 | MEDICATED_PATCH | TRANSDERMAL | 0 refills | Status: DC
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
## 2022-11-14 NOTE — Progress Notes (Signed)
Subjective:    Patient ID: Christopher [name], male    DOB: Aug 31, 1965, 57 y.o.   MRN: [id number]  HPI  Pt is a 57 yr old male with hx of Multitrauma- causing L femoral neck fx, degloving of L hip to going/scrotum, bladder neck trauma- got SPC, developed compartment syndrome -s/p surgery for that; also diverting colostomy, skin grafts, and s/p trach and PEG- PEG is out. 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 S/P screw removal and on IV ABX for L hip osteomyelitis. 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  Has R severe ulnar neuropathy as well. O2 3L full time.   Here for f/u on multitrauma and chronic L hip osteomyelitis with associated chronic pain.    last filled pain meds 5/17-    Started Lasix and HCTZ- checked his K+ last 1-2 weeks- but didn't get put on K+.   Had surgery on gastrocutaneous fistula- last week- doing better- no drainage since surgery.  PEG had fallen out on its own- months ago and when gained weight, started having fluid leakage.    Had Neuropsych testing- but doesn't have /get results until August- unless there's a cancellation.   SOB doing well today but gets SOB when moving much at all.  Starts coughing, etc.   Bladder spasms- comes and goes but doing better than they were-  On Imipramine, Gemtesa and Oxybutynin- and helping finally.   Abd still looks distended- thinks got a little better after surgery- put on Protonix and fungal- so Diflucan for 13 days.   Makes things tastes weird.    Larey Seat the other week-  didn't get hurt, but 1 time didn't call wife, fell- on R hip- but no increased pain.   Cannot stand since surgery more than a few seconds.  A little better this AM-  Even with RW- hardly able to get up to stand lately.   ECHO came back good for heart.   Mood not too good-  about the same as last few months  May just have to do home study for OSA.   Pain Inventory Average Pain 7 Pain Right Now 7 My pain is constant, sharp, burning, dull, stabbing, tingling, and aching  LOCATION OF PAIN  Groin & Hip  BOWEL  History of colostomy Yes    BLADDER Suprapubic SPC changed monthyl frequency Monthly   Mobility ability to climb steps?  no do you drive?  no needs help with transfers Do you have any goals in this area?  yes  Function I need assistance with the following:  feeding, dressing, bathing, toileting, meal prep, household duties, and shopping Do you have any goals in this area?  yes  Neuro/Psych bladder control problems bowel control problems weakness numbness tremor tingling trouble walking spasms dizziness confusion depression anxiety loss of taste or smell suicidal thoughts  Prior Studies Surgery with CT of abdomen- 5/24.   Physicians involved in your care Any changes since last visit?  no   Family History  Problem Relation Age of Onset  Breast cancer Mother        with mets to the bones   Social History   Socioeconomic History   Marital status: Married    Spouse name: Not on file   Number of children: Not on file   Years of education: Not on file   Highest education level: Not on file  Occupational History   Occupation: Disable  Tobacco Use   Smoking status: Former    Packs/day: 1.00    Years: 20.00    Additional pack years: 0.00    Total pack years: 20.00    Types: Cigarettes    Quit date: 07/10/2018    Years since quitting: 4.3   Smokeless tobacco: Never  Vaping Use   Vaping Use: Never used  Substance and Sexual Activity    Alcohol use: Never   Drug use: Yes    Types: Oxycodone, Fentanyl    Comment: Fentanyl patch/oxycodone since 06/2018   Sexual activity: Yes  Other Topics Concern   Not on file  Social History Narrative   ** Merged History Encounter **       Social Determinants of Health   Financial Resource Strain: Low Risk  (03/01/2019)   Overall Financial Resource Strain (CARDIA)    Difficulty of Paying Living Expenses: Not very hard  Food Insecurity: Food Insecurity Present (03/01/2019)   Hunger Vital Sign    Worried About Running Out of Food in the Last Year: Sometimes true    Ran Out of Food in the Last Year: Sometimes true  Transportation Needs: No Transportation Needs (03/01/2019)   PRAPARE - Administrator, Civil Service (Medical): No    Lack of Transportation (Non-Medical): No  Physical Activity: Inactive (03/01/2019)   Exercise Vital Sign    Days of Exercise per Week: 0 days    Minutes of Exercise per Session: 0 min  Stress: Stress Concern Present (03/01/2019)   [employer] of Occupational Health - Occupational Stress Questionnaire    Feeling of Stress : Rather much  Social Connections: Not on file   Past Surgical History:  Procedure Laterality Date   APPLICATION OF A-CELL OF BACK N/A 08/06/2018   Procedure: Application Of A-Cell Of Back;  Surgeon: Peggye Form, DO;  [facility];  Service: Plastics;  Laterality: N/A;   APPLICATION OF A-CELL OF EXTREMITY Left 08/06/2018   Procedure: Application Of A-Cell Of Extremity;  Surgeon: Peggye Form, DO;  [facility]: [facility];  Service: Plastics;  Laterality: Left;   APPLICATION OF A-CELL OF EXTREMITY Left 09/18/2019   Procedure: APPLICATION OF A-CELL OF EXTREMITY;  Surgeon: Peggye Form, DO;  [facility]: [facility];  Service: Plastics;  Laterality: Left;   APPLICATION OF WOUND VAC  07/12/2018   Procedure: Application Of Wound Vac to the Left Thigh and Scrotum.;  Surgeon: Roby Lofts, MD;  Location: [facility];  Service:  Orthopedics;;   APPLICATION OF WOUND VAC  07/10/2018   Procedure: Application Of Wound Vac;  Surgeon: Berna Bue, MD;  [facility] OR;  Service: General;;   BIOPSY  11/02/2022   Procedure: BIOPSY;  Surgeon: Lemar Lofty., MD;  Location: [facility];  Service: Gastroenterology;;   COLON SURGERY  2020   colostomy   COLOSTOMY N/A 07/23/2018   Procedure: COLOSTOMY;  Surgeon: Violeta Gelinas, MD;  Location: [facility] OR;  Service: General;  Laterality: N/A;   CYSTOSCOPY W/ URETERAL STENT PLACEMENT N/A 07/15/2018   Procedure: RETROGRADE URETHROGRAM;  Surgeon: Marcine Matar, MD;  [facility];  Service: Urology;  Laterality: N/A;   CYSTOSCOPY WITH LITHOLAPAXY N/A 05/06/2019   Procedure: CYSTOSCOPY BASKET BLADDER STONE EXTRACTION;  Surgeon: Malen Gauze, MD;  Location: [facility];  Service: Urology;  Laterality: N/A;  30 MINS   CYSTOSTOMY N/A 05/06/2019   Procedure: REPLACEMENT OF SUPRAPUBIC CATHETER;  Surgeon: Malen Gauze, MD;  Location: [facility];  Service: Urology;  Laterality: N/A;   DEBRIDEMENT AND CLOSURE WOUND Left 03/04/2019   Procedure: Excision of hip wound with placement of Acell;  Surgeon: Peggye Form, DO;  [facility];  Service: Plastics;  Laterality: Left;   ESOPHAGOGASTRODUODENOSCOPY N/A 08/14/2018   Procedure: ESOPHAGOGASTRODUODENOSCOPY (EGD);  Surgeon: Violeta Gelinas, MD;  Location: [facility] END[facility];  Service: General;  Laterality: N/A;  bedside   ESOPHAGOGASTRODUODENOSCOPY (EGD) WITH PROPOFOL N/A 11/02/2022   Procedure: ESOPHAGOGASTRODUODENOSCOPY (EGD) WITH PROPOFOL;  Surgeon: Meridee Score Netty Starring., MD;  Location: [facility];  Service: Gastroenterology;  Laterality: N/A;   FACIAL RECONSTRUCTION SURGERY     X 2--once as a teenager and second time in his 57's   HARDWARE REMOVAL Left 03/04/2019   Procedure: Left Hip Hardware Removal;  Surgeon: Roby Lofts, MD;  [facility];  Service: Orthopedics;   Laterality: Left;   HEMOSTASIS CLIP PLACEMENT  11/02/2022   Procedure: HEMOSTASIS CLIP PLACEMENT;  Surgeon: Lemar Lofty., MD;  Location: [facility];  Service: Gastroenterology;;   HIP PINNING,CANNULATED Left 07/12/2018   Procedure: CANNULATED HIP PINNING;  Surgeon: Roby Lofts, MD;  [facility];  Service: Orthopedics;  Laterality: Left;   HIP SURGERY     HOLMIUM LASER APPLICATION Right 07/15/2019   Procedure: HOLMIUM LASER APPLICATION;  Surgeon: Malen Gauze, MD;  Location: [facility];  Service: Urology;  Laterality: Right;   HOT HEMOSTASIS N/A 11/02/2022   Procedure: HOT HEMOSTASIS (ARGON PLASMA COAGULATION/BICAP);  Surgeon: Lemar Lofty., MD;  Location: [facility];  Service: Gastroenterology;  Laterality: N/A;   I & D EXTREMITY Left 07/25/2018   Procedure: Debridement of buttock, scrotum and left leg, placement of acell and vac;  Surgeon: Peggye Form, DO;  [facility];  Service: Plastics;  Laterality: Left;   I & D EXTREMITY N/A 08/06/2018   Procedure: Debridement of buttock, scrotum and left leg;  Surgeon: Peggye Form, DO;  [facility];  Service: Plastics;  Laterality: N/A;   I & D EXTREMITY N/A 08/13/2018   Procedure: Debridement of buttock, scrotum and left leg, placement of acell and vac;  Surgeon: Peggye Form, DO;  [facility];  Service: Plastics;  Laterality: N/A;  90 min, please   INCISION AND DRAINAGE HIP Left 09/18/2019   Procedure: IRRIGATION AND DEBRIDEMENT HIP/ PELVIS WITH WOUND VAC PLACEMENT;  Surgeon: Roby Lofts, MD;  [facility];  Service: Orthopedics;  Laterality: Left;   INCISION AND DRAINAGE OF WOUND N/A 07/18/2018   Procedure: Debridement of left leg, buttocks and scrotal wound with placement of acell and Flexiseal;  Surgeon: Peggye Form, DO;  [facility];  Service: Plastics;  Laterality: N/A;   INCISION AND DRAINAGE OF WOUND Left 08/29/2018   Procedure: Debridement of  buttock, scrotum and left leg, placement of acell and vac;  Surgeon: Peggye Form, DO;  [facility];  Service: Plastics;  Laterality: Left;  75 min, please   INCISION AND DRAINAGE OF WOUND Bilateral 10/23/2018   Procedure: DEBRIDEMENT OF BUTTOCK,SCROTUM, AND LEG WOUNDS WITH PLACEMENT OF ACELL- BILATERAL  90 MIN;  Surgeon: Peggye Form, DO;  [facility];  Service: Plastics;  Laterality: Bilateral;   IR ANGIOGRAM PELVIS SELECTIVE OR SUPRASELECTIVE  07/10/2018   IR ANGIOGRAM PELVIS SELECTIVE OR SUPRASELECTIVE  07/10/2018   IR ANGIOGRAM SELECTIVE EACH ADDITIONAL VESSEL  07/10/2018   IR EMBO ART  VEN HEMORR LYMPH EXTRAV  INC GUIDE ROADMAPPING  07/10/2018   IR GASTROSTOMY TUBE MOD SED  01/21/2021   IR NEPHROSTOMY PLACEMENT LEFT  04/05/2019   IR NEPHROSTOMY PLACEMENT RIGHT  05/31/2019   IR US GUIDE BX ASP/DRAIN  07/10/2018   IR US GUIDE VASC ACCESS RIGHT  07/10/2018   IR VENO/EXT/UNI LEFT  07/10/2018   IRRIGATION AND DEBRIDEMENT OF WOUND WITH SPLIT THICKNESS SKIN GRAFT Left 09/19/2018   Procedure: Debridement of gluteal wound with placement of acell to left leg.;  Surgeon: Peggye Form, DO;  [facility];  Service: Plastics;  Laterality: Left;  2.5 hours, please   LAPAROTOMY N/A 07/12/2018   Procedure: EXPLORATORY LAPAROTOMY;  Surgeon: Violeta Gelinas, MD;  Location: [facility] OR;  Service: General;  Laterality: N/A;   LAPAROTOMY N/A 07/15/2018   Procedure: WOUND EXPLORATION; CLOSURE OF ABDOMEN;  Surgeon: Violeta Gelinas, MD;  Location: [facility] OR;  Service: General;  Laterality: N/A;   LAPAROTOMY  07/10/2018   Procedure: Exploratory Laparotomy;  Surgeon: Berna Bue, MD;  Location: [facility] OR;  Service: General;;   MASS EXCISION Left 09/18/2019   Procedure: EXCISION UPPER LEFT INNER THIGH WOUND;  Surgeon: Peggye Form, DO;  [facility];  Service: Plastics;  Laterality: Left;   NEPHROLITHOTOMY Right 07/15/2019   Procedure: NEPHROLITHOTOMY PERCUTANEOUS;  Surgeon: Malen Gauze,  MD;  Location: [facility];  Service: Urology;  Laterality: Right;  90 MINS   PEG PLACEMENT N/A 08/14/2018   Procedure: PERCUTANEOUS ENDOSCOPIC GASTROSTOMY (PEG) PLACEMENT;  Surgeon: Violeta Gelinas, MD;  Location: [facility] ENDOSCOPY;  Service: General;  Laterality: N/A;   PERCUTANEOUS TRACHEOSTOMY N/A 08/02/2018   Procedure: PERCUTANEOUS TRACHEOSTOMY;  Surgeon: Violeta Gelinas, MD;  Location: [facility] OR;  Service: General;  Laterality: N/A;   RADIOLOGY WITH ANESTHESIA N/A 07/10/2018   Procedure: IR WITH ANESTHESIA;  Surgeon: Simonne Come, MD;  Location: [facility] OR;  Service: Radiology;  Laterality: N/A;   RADIOLOGY WITH ANESTHESIA Right 07/10/2018   Procedure: Ir With Anesthesia;  Surgeon: Simonne Come, MD;  Location: [facility] OR;  Service: Radiology;  Laterality: Right;   SAVORY DILATION N/A 11/02/2022   Procedure: SAVORY DILATION;  Surgeon: Meridee Score Netty Starring., MD;  Location: [facility];  Service: Gastroenterology;  Laterality: N/A;   SCROTAL EXPLORATION N/A 07/15/2018   Procedure: SCROTUM DEBRIDEMENT;  Surgeon: Marcine Matar, MD;  Location: [facility] OR;  Service: Urology;  Laterality: N/A;   SHOULDER SURGERY     SKIN SPLIT GRAFT Right 09/19/2018   Procedure: Skin Graft Split Thickness;  Surgeon: Peggye Form, DO;  [facility];  Service: Plastics;  Laterality: Right;   SKIN SPLIT GRAFT N/A 10/03/2018   Procedure: Split thickness skin graft to gluteal area with acell placement;  Surgeon: Peggye Form, DO;  [facility];  Service: Plastics;  Laterality: N/A;  3 hours, please   VACUUM ASSISTED CLOSURE CHANGE N/A 07/12/2018   Procedure: ABDOMINAL VACUUM ASSISTED CLOSURE CHANGE and abdominal washout;  Surgeon: Violeta Gelinas, MD;  Location: [facility] OR;  Service: General;  Laterality: N/A;   WOUND DEBRIDEMENT Left 07/23/2018   Procedure: DEBRIDEMENT LEFT BUTTOCK  WOUND;  Surgeon: Violeta Gelinas, MD;  Location: [facility] OR;  Service: General;  Laterality: Left;   WOUND EXPLORATION Left 07/10/2018    Procedure: WOUND EXPLORATION LEFT GROIN;  Surgeon: Berna Bue, MD;  [facility];  Service: General;  Laterality: Left;   Past Medical History:  Diagnosis Date   Acute on chronic respiratory failure with hypoxia (HCC) 06/2018   trach removed 11-16-2018, on vent from jan until may 2020 - uses albuterol prn   Anxiety    Bacteremia due to Pseudomonas 06/2018   Chronic osteomyelitis (HCC)    Chronic pain syndrome    Clostridium difficile colitis 10/30/2019   tx with abx    Depression    DVT (deep venous thrombosis) (HCC) 2020   right brachial post PICC line   History of blood transfusion 06/2018   History of Clostridioides difficile colitis    History of kidney stones    Hypertension    norvasc d/c by pcp on 11/05/19   Multiple traumatic injuries    Penile pain 11/18/2019   Pneumonia 11/2009   2020 x 2   Walker as ambulation aid    Wheelchair bound    electric   Wound discharge    left hip wound with bloody/clear drainage change dressing q day surgilube with gauze, between legs wound using calcium algenate pad bid   Ht 6\' 3"  (1.905 m)   BMI 25.62 kg/m   Opioid Risk Score:   Fall Risk Score:  `1  Depression screen [facility] 2/9     11/01/2022   10:45 AM 09/30/2022   10:51 AM 08/17/2022    9:21 AM 07/25/2022   10:34 AM 06/17/2022   12:54 PM 03/31/2022   12:36 PM 01/12/2022   11:17 AM  Depression screen PHQ 2/9  Decreased Interest 0 2 1 0 0  0  Down, Depressed, Hopeless 0 3 1 0 0 3 1  PHQ - 2 Score 0 5 2 0 0 3 1  Altered sleeping 0 3    3   Tired, decreased energy 0 3    2   Change in appetite 0 3    3   Feeling bad or failure about yourself  0 3    3   Trouble concentrating 0 3    3   Moving slowly or fidgety/restless 0 1    2   Suicidal thoughts 0 0    0   PHQ-9 Score 0 21    19   Difficult doing work/chores Not difficult at all Very difficult    Very difficult     Review of Systems  Constitutional:        Loss of taste & smell  Gastrointestinal:        Colostomy    Genitourinary:        Suprapubic  Musculoskeletal:  Positive for gait problem.       Spasms  Neurological:  Positive for dizziness, tremors, weakness and numbness.       Tingling  Psychiatric/Behavioral:  Positive for confusion and suicidal ideas.        Depression, anxiety  All other systems reviewed and are negative.     Objective:   Physical Exam Awake, alert, appropriate, coughing intermittently ; O2 4L, in power w/c; accompanied by wife, NAD  MS:  RLE- HF 4+/5; KE 4+/5; DF and PF 5-/5 LLE- HF  4/5 limited by pain; KE/KF 4+/5 and DF/PF 4+/5  Couldn't stand since no RW       Assessment & Plan:   Pt is a  57 yr old male with hx of Multitrauma- causing L femoral neck fx, degloving of L hip to going/scrotum, bladder neck trauma- got SPC, developed compartment syndrome -s/p surgery for that; also diverting colostomy, skin grafts, and s/p trach and PEG- PEG is out. 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 S/P screw removal and on IV ABX for L hip osteomyelitis. 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  B/L tennis elbow new-and B/L pending/forming ulnar neuropathy Osteomyelitis of L hip found again-on  PO vanc and Cipro  with new PEG- 7/22- fell out-  and colostomy from before.   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  Has R severe ulnar neuropathy as well. O2 3L full time.   Here for f/u on multitrauma and chronic L hip osteomyelitis with associated chronic pain.     Fungal thrush and fungal esophagitis can cause food and everything to taste weird. Is normal for thrush AND for Diflucan to cause it.   2.  Won't do therapy per pt- admits won't do it, so will not order at this time- since won't work if he doesn't participate.  Said due to  hurting worse on days they came- BEFORE saw them- so thinks depression/anxiety played a part.   3. Refilled MSIR and Duragesic 15 mg q3 hours prn and 75 mcg q2 days.   4. Has appt in August for reveal of neuropsych testing-   5. Set up for sleep study at home, please.   6. Make sure they f/u on Potassium- I am concerned needs to be on chronic potassium.  Won't eat a banana every day either.   7. F/U in 6 weeks- double appt- Multi trauma.   8. Needs to eat protein with every meal.    I spent a total of  32  minutes on total care today- >50% coordination of care- due to  Also discussion with nursing care mgr.

## 2022-11-21 ENCOUNTER — Other Ambulatory Visit: Payer: Self-pay | Admitting: Gastroenterology

## 2022-11-21 ENCOUNTER — Other Ambulatory Visit: Payer: Self-pay | Admitting: Family Medicine

## 2022-11-28 ENCOUNTER — Other Ambulatory Visit: Payer: Self-pay | Admitting: Physical Medicine & Rehabilitation

## 2022-11-29 ENCOUNTER — Telehealth: Payer: Self-pay | Admitting: *Deleted

## 2022-11-29 ENCOUNTER — Telehealth: Payer: Self-pay

## 2022-11-29 NOTE — Telephone Encounter (Signed)
Received a fax from [facility] and [employer] regarding re-certification of feeding tube order that was originally placed on 01/26/2021. Per previous telephone encounter PEG tube has been discontinued for a very long time, possibly over a year. I faxed the request back letting them know that services are no longer needed at this time.

## 2022-11-29 NOTE — Telephone Encounter (Signed)
[employer] form faxed to [phone] [facility] repair order form placed to be scan inpatient chart

## 2022-11-30 ENCOUNTER — Telehealth: Payer: Self-pay | Admitting: *Deleted

## 2022-11-30 NOTE — Telephone Encounter (Signed)
Paper fax from pharmacy Patient lost hardcopy and is requesting morphine rx be sent to pharmacy.

## 2022-12-01 MED ORDER — MORPHINE SULFATE 15 MG PO TABS: 15.0000 mg | ORAL_TABLET | ORAL | 0 refills | Status: DC | PRN

## 2022-12-05 ENCOUNTER — Telehealth: Payer: Self-pay | Admitting: Family Medicine

## 2022-12-05 NOTE — Telephone Encounter (Signed)
Pt states they can't see lab results. Please call pt back with results.

## 2022-12-06 ENCOUNTER — Other Ambulatory Visit: Payer: Self-pay | Admitting: Gastroenterology

## 2022-12-06 NOTE — Telephone Encounter (Signed)
Left message to return call to our office at their convenience.  

## 2022-12-07 NOTE — Telephone Encounter (Signed)
Dr. Jimmey [name], pt called about labs that were done on 5/21, but it looks like they were never done. Pt's wife Christopher [name] said pt is doing fine and does not feel that pt needs to come in for labs.

## 2022-12-07 NOTE — Telephone Encounter (Signed)
Spoke to pt's wife [name], told her I do not have any lab results for pt, looks like they were not done. [name] said pt had lab drawn when he was there last 5/21. Told her sorry there is nothing. Told her I will check with Dr. [name] to see if he wants them done. [name] said she does not feel like pt needs done, he is doing fine. Told her I will let Dr. Huntley Dec know. [name] verbalized understanding.

## 2022-12-08 NOTE — Telephone Encounter (Signed)
Looks like it was collected but never resulted. Can we check with lab to see what happened? This should not have happened.  Katina Degree. [name], MD 12/08/2022 7:46 AM

## 2022-12-09 ENCOUNTER — Other Ambulatory Visit: Payer: Self-pay | Admitting: Family

## 2022-12-09 DIAGNOSIS — M8668 Other chronic osteomyelitis, other site: Secondary | ICD-10-CM

## 2022-12-12 ENCOUNTER — Other Ambulatory Visit (HOSPITAL_COMMUNITY): Payer: Self-pay

## 2022-12-16 ENCOUNTER — Other Ambulatory Visit: Payer: Self-pay | Admitting: Physical Medicine and Rehabilitation

## 2022-12-20 NOTE — Telephone Encounter (Signed)
Spoke with patient's spouse who paid out of pocket and will fill with worker's comp for refund. Nothing further needed at this time.

## 2022-12-21 ENCOUNTER — Ambulatory Visit (INDEPENDENT_AMBULATORY_CARE_PROVIDER_SITE_OTHER)
Admission: RE | Admit: 2022-12-21 | Discharge: 2022-12-21 | Disposition: A | Discharge status: Home / Self Care | Payer: Self-pay | Source: Ambulatory Visit | Attending: Gastroenterology | Admitting: Gastroenterology

## 2022-12-21 ENCOUNTER — Telehealth: Payer: Self-pay | Admitting: Family Medicine

## 2022-12-21 DIAGNOSIS — K316 Fistula of stomach and duodenum: Secondary | ICD-10-CM

## 2022-12-21 NOTE — Telephone Encounter (Signed)
Patient dropped off document Home Health Certificate (Order ID [id number]), to be filled out by provider. Patient requested to send it back via Fax [phone]. Document is located in providers tray at front office.Please advise

## 2022-12-26 ENCOUNTER — Ambulatory Visit (HOSPITAL_COMMUNITY)
Admission: RE | Admit: 2022-12-26 | Discharge: 2022-12-26 | Disposition: A | Discharge status: Home / Self Care | Payer: Self-pay | Source: Ambulatory Visit | Attending: Physical Medicine and Rehabilitation | Admitting: Physical Medicine and Rehabilitation

## 2022-12-26 ENCOUNTER — Encounter
Payer: No Typology Code available for payment source | Attending: Physical Medicine and Rehabilitation | Admitting: Physical Medicine and Rehabilitation

## 2022-12-26 ENCOUNTER — Encounter: Payer: Self-pay | Admitting: Physical Medicine and Rehabilitation

## 2022-12-26 VITALS — BP 121/83 | HR 97 | Ht 75.0 in

## 2022-12-26 DIAGNOSIS — F518 Other sleep disorders not due to a substance or known physiological condition: Secondary | ICD-10-CM

## 2022-12-26 DIAGNOSIS — M2170 Unequal limb length (acquired), unspecified site: Secondary | ICD-10-CM | POA: Diagnosis present

## 2022-12-26 DIAGNOSIS — M869 Osteomyelitis, unspecified: Secondary | ICD-10-CM | POA: Insufficient documentation

## 2022-12-26 DIAGNOSIS — G8921 Chronic pain due to trauma: Secondary | ICD-10-CM

## 2022-12-26 MED ORDER — PAROXETINE HCL 40 MG PO TABS: ORAL_TABLET | ORAL | 2 refills | Status: DC

## 2022-12-26 MED ORDER — MORPHINE SULFATE 15 MG PO TABS: 15.0000 mg | ORAL_TABLET | ORAL | 0 refills | Status: DC | PRN

## 2022-12-26 MED ORDER — FENTANYL 75 MCG/HR TD PT72: 1.0000 | MEDICATED_PATCH | TRANSDERMAL | 0 refills | Status: DC

## 2022-12-26 NOTE — Telephone Encounter (Signed)
Form faxed to [phone] Copy placed to be scan in patient chart

## 2022-12-26 NOTE — Patient Instructions (Addendum)
Pt is a 57 yr old male with hx of Multitrauma- causing L femoral neck fx, degloving of L hip to going/scrotum, bladder neck trauma- got SPC, developed compartment syndrome -s/p surgery for that; also diverting colostomy, skin grafts, and s/p trach and PEG- PEG is out. 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 S/P screw removal and on IV ABX for L hip osteomyelitis. 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  Has R severe ulnar neuropathy as well. O2 3L full time.   Here for f/u on multitrauma and chronic L hip osteomyelitis with associated chronic pain.   leg length discrepancy- appears worse on L.   Check xray of L hip to make sure nothing collapsed. Will get L pelvis due to concern that leaning to right side more-with gait-  not wearing shoes with lift. Writing an order today. To be done.   2.  Will see, once xray results are done- if appropriate for H/H- pt says he will "try " to do therapy.  Will order once Xray done and found to be ok.   3. Con't Duragesic 75 mcg- needs refills  4. Con't MSIR- needs refills 15 mg q3 hours prn-    5. Needs Paxil, 40 mg nightly- for anxiety/depression.   6. Con't Gabapentin 300 mg TID- doesn't need refill  7. Con't Phenergan as needed for nausea- has refills.   8.  F/U in 6 weeks- double appt- L hip osteomyelitis with   9. If his sleeplessness worse and doesn't improve, and gets "wild"- needs to get checked for impending infection.   10. Get a second joystick added to w/c so caregiver can drive w/c. Wrote rx and  gave to [place].   11. Might have carpal tunnel syndrome- since cocks wrists into hyperflexion at rest/sleep  12. Hasn't gotten him evaluated yet for OSA?   11. Next w/c will get from [facility] or [facility].   12. Hasn't used muscle relaxers- hasn't made any difference.

## 2022-12-26 NOTE — Progress Notes (Signed)
Subjective:    Patient ID: RONDAL VANDEVELDE, male    DOB: [birth year], 57 y.o.   MRN: [id number]  HPI Pt is a 57 yr old male with hx of Multitrauma- causing L femoral neck fx, degloving of L hip to going/scrotum, bladder neck trauma- got SPC, developed compartment syndrome -s/p surgery for that; also diverting colostomy, skin grafts, and s/p trach and PEG- PEG is out. 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 S/P screw removal and on IV ABX for L hip osteomyelitis. 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  Has R severe ulnar neuropathy as well. O2 3L full time.   Here for f/u on multitrauma and chronic L hip osteomyelitis with associated chronic pain.     Fought the bed all night.  No reason, but didn't sleep well- very tired this AM.   Supposed to get results for neuropsych testing next month.    Lasix and hydrochlorothiazide has helped LE swelling a lot- back in boots.   Abd still somewhat distended, but per wife, not as bad as before.   Hasn't fallen lately.  Only time he stands to pull up pants- and to wash genitals and to get w/c to bed and back to w/c.   No falls during that.  Wife has noticed in last 2 weeks- struggling walking 3-4 steps with RW- very unsteady.  Leans to one side. Really pronounced.  Also leaning more in w/c- to Right.   He says it's due to leg not being as long as other one.  He's sees ID for L hip, but no one look at it-  Doesn't see wound care anymore- only wound is L hip- open to let drainage out.  Other  wounds are healed Consistent for a long time.    Won't do H/H in past- even when said he wound-  Now said would try- since doing worse.   Doesn't like shoe with Lift on L- so won't wear.    Stopped muscle relaxers- wasn't making a difference.   On 4L Of O2 still.    Wife is going on vacation- 7/25 to 7/30-  Without pt.     Pain Inventory Average Pain 8 Pain Right Now 8 My pain is constant, sharp, burning, dull, stabbing, tingling, and aching  LOCATION OF PAIN  buttocks, groin, hip  BOWEL  History of colostomy Yes    BLADDER  In and out cath, frequency prn Suprapubic Able to self cath No      Mobility use a wheelchair  Function I need assistance with the following:  feeding, dressing, bathing, toileting, meal prep, household duties, and shopping  Neuro/Psych bladder control problems bowel control problems weakness numbness tremor tingling trouble walking spasms dizziness confusion depression  Prior Studies Any changes since last visit?  yes x-rays  Physicians involved in your care Any changes since last visit?  no   Family History  Problem Relation Age of Onset   Breast cancer Mother        with mets to the bones  Social History   Socioeconomic History   Marital status: Married    Spouse name: Not on file   Number of children: Not on file   Years of education: Not on file   Highest education level: Not on file  Occupational History   Occupation: Disable  Tobacco Use   Smoking status: Former    Current packs/day: 0.00    Average packs/day: 1 pack/day for 20.0 years (20.0 ttl pk-yrs)    Types: Cigarettes    Start date: 07/10/1998    Quit date: 07/10/2018    Years since quitting: 4.4   Smokeless tobacco: Never  Vaping Use   Vaping status: Never Used  Substance and Sexual Activity   Alcohol use: Never   Drug use: Yes    Types: Oxycodone, Fentanyl    Comment: Fentanyl patch/oxycodone since 06/2018   Sexual activity: Yes   Other Topics Concern   Not on file  Social History Narrative   ** Merged History Encounter **       Social Determinants of Health   Financial Resource Strain: Low Risk  (03/01/2019)   Overall Financial Resource Strain (CARDIA)    Difficulty of Paying Living Expenses: Not very hard  Food Insecurity: Food Insecurity Present (03/01/2019)   Hunger Vital Sign    Worried About Running Out of Food in the Last Year: Sometimes true    Ran Out of Food in the Last Year: Sometimes true  Transportation Needs: No Transportation Needs (03/01/2019)   PRAPARE - Administrator, Civil Service (Medical): No    Lack of Transportation (Non-Medical): No  Physical Activity: Inactive (03/01/2019)   Exercise Vital Sign    Days of Exercise per Week: 0 days    Minutes of Exercise per Session: 0 min  Stress: Stress Concern Present (03/01/2019)   [employer] of Occupational Health - Occupational Stress Questionnaire    Feeling of Stress : Rather much  Social Connections: Not on file   Past Surgical History:  Procedure Laterality Date   APPLICATION OF A-CELL OF BACK N/A 08/06/2018   Procedure: Application Of A-Cell Of Back;  Surgeon: Peggye Form, DO;  Location: [facility];  Service: Plastics;  Laterality: N/A;   APPLICATION OF A-CELL OF EXTREMITY Left 08/06/2018   Procedure: Application Of A-Cell Of Extremity;  Surgeon: Peggye Form, DO;  Location: [facility];  Service: Plastics;  Laterality: Left;   APPLICATION OF A-CELL OF EXTREMITY Left 09/18/2019   Procedure: APPLICATION OF A-CELL OF EXTREMITY;  Surgeon: Peggye Form, DO;  [facility]: [facility];  Service: Plastics;  Laterality: Left;   APPLICATION OF WOUND VAC  07/12/2018   Procedure: Application Of Wound Vac to the Left Thigh and Scrotum.;  Surgeon: Roby Lofts, MD;  Location: [facility];  Service: Orthopedics;;   APPLICATION OF WOUND VAC  07/10/2018   Procedure: Application Of Wound Vac;  Surgeon: Berna Bue, MD;  Location: [facility]  OR;  Service: General;;   BIOPSY  11/02/2022   Procedure: BIOPSY;  Surgeon: Lemar Lofty., MD;  Location: [facility];  Service: Gastroenterology;;   COLON SURGERY  2020   colostomy   COLOSTOMY N/A 07/23/2018   Procedure: COLOSTOMY;  Surgeon: Violeta Gelinas, MD;  Location: [facility] OR;  Service: General;  Laterality: N/A;   CYSTOSCOPY W/ URETERAL STENT PLACEMENT N/A 07/15/2018   Procedure: RETROGRADE URETHROGRAM;  Surgeon: Marcine Matar, MD;  Location: [facility] OR;  Service: Urology;  Laterality: N/A;   CYSTOSCOPY WITH LITHOLAPAXY N/A 05/06/2019  Procedure: CYSTOSCOPY BASKET BLADDER STONE EXTRACTION;  Surgeon: Malen Gauze, MD;  Location: [facility];  Service: Urology;  Laterality: N/A;  30 MINS   CYSTOSTOMY N/A 05/06/2019   Procedure: REPLACEMENT OF SUPRAPUBIC CATHETER;  Surgeon: Malen Gauze, MD;  Location: [facility];  Service: Urology;  Laterality: N/A;   DEBRIDEMENT AND CLOSURE WOUND Left 03/04/2019   Procedure: Excision of hip wound with placement of Acell;  Surgeon: Peggye Form, DO;  [facility];  Service: Plastics;  Laterality: Left;   ESOPHAGOGASTRODUODENOSCOPY N/A 08/14/2018   Procedure: ESOPHAGOGASTRODUODENOSCOPY (EGD);  Surgeon: Violeta Gelinas, MD;  Location: [facility] ENDOSCOPY;  Service: General;  Laterality: N/A;  bedside   ESOPHAGOGASTRODUODENOSCOPY (EGD) WITH PROPOFOL N/A 11/02/2022   Procedure: ESOPHAGOGASTRODUODENOSCOPY (EGD) WITH PROPOFOL;  Surgeon: Meridee Score Netty Starring., MD;  Location: [facility];  Service: Gastroenterology;  Laterality: N/A;   FACIAL RECONSTRUCTION SURGERY     X 2--once as a teenager and second time in his 89's   HARDWARE REMOVAL Left 03/04/2019   Procedure: Left Hip Hardware Removal;  Surgeon: Roby Lofts, MD;  [facility]: [facility];  Service: Orthopedics;  Laterality: Left;   HEMOSTASIS CLIP PLACEMENT  11/02/2022   Procedure: HEMOSTASIS CLIP PLACEMENT;  Surgeon: Lemar Lofty., MD;  Location:  [facility];  Service: Gastroenterology;;   HIP PINNING,CANNULATED Left 07/12/2018   Procedure: CANNULATED HIP PINNING;  Surgeon: Roby Lofts, MD;  [facility];  Service: Orthopedics;  Laterality: Left;   HIP SURGERY     HOLMIUM LASER APPLICATION Right 07/15/2019   Procedure: HOLMIUM LASER APPLICATION;  Surgeon: Malen Gauze, MD;  Location: [facility];  Service: Urology;  Laterality: Right;   HOT HEMOSTASIS N/A 11/02/2022   Procedure: HOT HEMOSTASIS (ARGON PLASMA COAGULATION/BICAP);  Surgeon: Lemar Lofty., MD;  Location: [facility];  Service: Gastroenterology;  Laterality: N/A;   I & D EXTREMITY Left 07/25/2018   Procedure: Debridement of buttock, scrotum and left leg, placement of acell and vac;  Surgeon: Peggye Form, DO;  [facility];  Service: Plastics;  Laterality: Left;   I & D EXTREMITY N/A 08/06/2018   Procedure: Debridement of buttock, scrotum and left leg;  Surgeon: Peggye Form, DO;  [facility];  Service: Plastics;  Laterality: N/A;   I & D EXTREMITY N/A 08/13/2018   Procedure: Debridement of buttock, scrotum and left leg, placement of acell and vac;  Surgeon: Peggye Form, DO;  [facility];  Service: Plastics;  Laterality: N/A;  90 min, please   INCISION AND DRAINAGE HIP Left 09/18/2019   Procedure: IRRIGATION AND DEBRIDEMENT HIP/ PELVIS WITH WOUND VAC PLACEMENT;  Surgeon: Roby Lofts, MD;  [facility]: [facility];  Service: Orthopedics;  Laterality: Left;   INCISION AND DRAINAGE OF WOUND N/A 07/18/2018   Procedure: Debridement of left leg, buttocks and scrotal wound with placement of acell and Flexiseal;  Surgeon: Peggye Form, DO;  [facility];  Service: Plastics;  Laterality: N/A;   INCISION AND DRAINAGE OF WOUND Left 08/29/2018   Procedure: Debridement of buttock, scrotum and left leg, placement of acell and vac;  Surgeon: Peggye Form, DO;  [facility];  Service: Plastics;  Laterality: Left;   75 min, please   INCISION AND DRAINAGE OF WOUND Bilateral 10/23/2018   Procedure: DEBRIDEMENT OF BUTTOCK,SCROTUM, AND LEG WOUNDS WITH PLACEMENT OF ACELL- BILATERAL 90 MIN;  Surgeon: Peggye Form, DO;  [facility]: [facility];  Service: Plastics;  Laterality:  Bilateral;   IR ANGIOGRAM PELVIS SELECTIVE OR SUPRASELECTIVE  07/10/2018   IR ANGIOGRAM PELVIS SELECTIVE OR SUPRASELECTIVE  07/10/2018   IR ANGIOGRAM SELECTIVE EACH ADDITIONAL VESSEL  07/10/2018   IR EMBO ART  VEN HEMORR LYMPH EXTRAV  INC GUIDE ROADMAPPING  07/10/2018   IR GASTROSTOMY TUBE MOD SED  01/21/2021   IR NEPHROSTOMY PLACEMENT LEFT  04/05/2019   IR NEPHROSTOMY PLACEMENT RIGHT  05/31/2019   IR US GUIDE BX ASP/DRAIN  07/10/2018   IR US GUIDE VASC ACCESS RIGHT  07/10/2018   IR VENO/EXT/UNI LEFT  07/10/2018   IRRIGATION AND DEBRIDEMENT OF WOUND WITH SPLIT THICKNESS SKIN GRAFT Left 09/19/2018   Procedure: Debridement of gluteal wound with placement of acell to left leg.;  Surgeon: Peggye Form, DO;  [facility];  Service: Plastics;  Laterality: Left;  2.5 hours, please   LAPAROTOMY N/A 07/12/2018   Procedure: EXPLORATORY LAPAROTOMY;  Surgeon: Violeta Gelinas, MD;  Location: [facility] OR;  Service: General;  Laterality: N/A;   LAPAROTOMY N/A 07/15/2018   Procedure: WOUND EXPLORATION; CLOSURE OF ABDOMEN;  Surgeon: Violeta Gelinas, MD;  Location: [facility] OR;  Service: General;  Laterality: N/A;   LAPAROTOMY  07/10/2018   Procedure: Exploratory Laparotomy;  Surgeon: Berna Bue, MD;  Location: [facility] OR;  Service: General;;   MASS EXCISION Left 09/18/2019   Procedure: EXCISION UPPER LEFT INNER THIGH WOUND;  Surgeon: Peggye Form, DO;  [facility];  Service: Plastics;  Laterality: Left;   NEPHROLITHOTOMY Right 07/15/2019   Procedure: NEPHROLITHOTOMY PERCUTANEOUS;  Surgeon: Malen Gauze, MD;  Location: [facility];  Service: Urology;  Laterality: Right;  90 MINS   PEG PLACEMENT N/A 08/14/2018   Procedure: PERCUTANEOUS  ENDOSCOPIC GASTROSTOMY (PEG) PLACEMENT;  Surgeon: Violeta Gelinas, MD;  Location: [facility];  Service: General;  Laterality: N/A;   PERCUTANEOUS TRACHEOSTOMY N/A 08/02/2018   Procedure: PERCUTANEOUS TRACHEOSTOMY;  Surgeon: Violeta Gelinas, MD;  Location: [facility] OR;  Service: General;  Laterality: N/A;   RADIOLOGY WITH ANESTHESIA N/A 07/10/2018   Procedure: IR WITH ANESTHESIA;  Surgeon: Simonne Come, MD;  Location: [facility] OR;  Service: Radiology;  Laterality: N/A;   RADIOLOGY WITH ANESTHESIA Right 07/10/2018   Procedure: Ir With Anesthesia;  Surgeon: Simonne Come, MD;  Location: [facility] OR;  Service: Radiology;  Laterality: Right;   SAVORY DILATION N/A 11/02/2022   Procedure: SAVORY DILATION;  Surgeon: Meridee Score Netty Starring., MD;  Location: [facility];  Service: Gastroenterology;  Laterality: N/A;   SCROTAL EXPLORATION N/A 07/15/2018   Procedure: SCROTUM DEBRIDEMENT;  Surgeon: Marcine Matar, MD;  Location: [facility] OR;  Service: Urology;  Laterality: N/A;   SHOULDER SURGERY     SKIN SPLIT GRAFT Right 09/19/2018   Procedure: Skin Graft Split Thickness;  Surgeon: Peggye Form, DO;  Location[facility];  Service: Plastics;  Laterality: Right;   SKIN SPLIT GRAFT N/A 10/03/2018   Procedure: Split thickness skin graft to gluteal area with acell placement;  Surgeon: Peggye Form, DO;  Location: [facility];  Service: Plastics;  Laterality: N/A;  3 hours, please   VACUUM ASSISTED CLOSURE CHANGE N/A 07/12/2018   Procedure: ABDOMINAL VACUUM ASSISTED CLOSURE CHANGE and abdominal washout;  Surgeon: Violeta Gelinas, MD;  Location: [facility] OR;  Service: General;  Laterality: N/A;   WOUND DEBRIDEMENT Left 07/23/2018   Procedure: DEBRIDEMENT LEFT BUTTOCK  WOUND;  Surgeon: Violeta Gelinas, MD;  Location: [facility] OR;  Service: General;  Laterality: Left;   WOUND EXPLORATION Left 07/10/2018   Procedure: WOUND EXPLORATION LEFT  GROIN;  Surgeon: Berna Bue, MD;  Location: [facility] OR;  Service: General;  Laterality: Left;   Past Medical  History:  Diagnosis Date   Acute on chronic respiratory failure with hypoxia (HCC) 06/2018   trach removed 11-16-2018, on vent from jan until may 2020 - uses albuterol prn   Anxiety    Bacteremia due to Pseudomonas 06/2018   Chronic osteomyelitis (HCC)    Chronic pain syndrome    Clostridium difficile colitis 10/30/2019   tx with abx    Depression    DVT (deep venous thrombosis) (HCC) 2020   right brachial post PICC line   History of blood transfusion 06/2018   History of Clostridioides difficile colitis    History of kidney stones    Hypertension    norvasc d/c by pcp on 11/05/19   Multiple traumatic injuries    Penile pain 11/18/2019   Pneumonia 11/2009   2020 x 2   Walker as ambulation aid    Wheelchair bound    electric   Wound discharge    left hip wound with bloody/clear drainage change dressing q day surgilube with gauze, between legs wound using calcium algenate pad bid   BP 121/83   Pulse 97   Ht 6\' 3"  (1.905 m)   BMI 25.62 kg/m   Opioid Risk Score:   Fall Risk Score:  `1  Depression screen [facility] 2/9     12/26/2022   11:01 AM 11/14/2022   11:14 AM 11/14/2022   11:08 AM 11/01/2022   10:45 AM 09/30/2022   10:51 AM 08/17/2022    9:21 AM 07/25/2022   10:34 AM  Depression screen PHQ 2/9  Decreased Interest 1 1 1  0 2 1 0  Down, Depressed, Hopeless 1 1 1  0 3 1 0  PHQ - 2 Score 2 2 2  0 5 2 0  Altered sleeping    0 3    Tired, decreased energy    0 3    Change in appetite    0 3    Feeling bad or failure about yourself     0 3    Trouble concentrating    0 3    Moving slowly or fidgety/restless    0 1    Suicidal thoughts    0 0    PHQ-9 Score    0 21    Difficult doing work/chores    Not difficult at all Very difficult      Review of Systems  Gastrointestinal:        Colostomy   Genitourinary:        Foley in place  Musculoskeletal:  Positive for gait problem.       Spasms  Neurological:  Positive for dizziness, tremors, weakness and numbness.       Tingling   Psychiatric/Behavioral:  Positive for confusion.        Depression, anxiety  All other systems reviewed and are negative.     Objective:   Physical Exam Awake, but barely; in power w/c; accompanied by wife, NAD Obvious leg length discrepancy- likely more than 1 inch based on measuring;  Also leaning to right, but does that to keep weight on L hip due to pain.  Kept falling asleep-  Having hypnagogic jerking  Abd appears more distended- but not as bad as last time Leaning MORE to right than normal, of note.  Kepts cocking wrists into hyperflexion at rest/sleep.      Assessment & Plan:  Pt is a 57 yr old male with hx of Multitrauma- causing L femoral neck fx, degloving of L hip to going/scrotum, bladder neck trauma- got SPC, developed compartment syndrome -s/p surgery for that; also diverting colostomy, skin grafts, and s/p trach and PEG- PEG is out. 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 S/P screw removal and on IV ABX for L hip osteomyelitis. 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 still has colostomy, but PEG came out 7/23.   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  Has R severe ulnar neuropathy as well. O2 3L full time.   Here for f/u on multitrauma and chronic L hip osteomyelitis with associated chronic pain.   leg length discrepancy- appears worse on L.   Check xray of L hip to make sure nothing collapsed. Will get L pelvis due to concern that leaning to right side more-with gait-  not wearing shoes with lift. Writing an order today. To be done.   2.  Will see, once xray  results are done- if appropriate for H/H- pt says he will "try " to do therapy.  Will order once Xray done and found to be ok.   3. Con't Duragesic 75 mcg- needs refills  4. Con't MSIR- needs refills 15 mg q3 hours prn-    5. Needs Paxil, 40 mg nightly- for anxiety/depression.   6. Con't Gabapentin 300 mg TID- doesn't need refill  7. Con't Phenergan as needed for nausea- has refills.   8.  F/U in 6 weeks- double appt- L hip osteomyelitis with   9. If his sleeplessness worse and doesn't improve, and gets "wild"- needs to get checked for impending infection.   10. Get a second joystick added to w/c so caregiver can drive w/c.   11. Next w/c will get from [facility] or [facility].   12. Hasn't used muscle relaxers- hasn't made any difference.    I spent a total of 45   minutes on total care today- >50% coordination of care- due to d/w wife and worker's comp nurse about meds; refills; L hip xray- and other H/H.  Get him a new joystick.

## 2022-12-26 NOTE — Telephone Encounter (Signed)
Form placed to be reviewed in PCP office

## 2022-12-27 ENCOUNTER — Telehealth: Payer: Self-pay | Admitting: Internal Medicine

## 2022-12-27 DIAGNOSIS — R400 Somnolence: Secondary | ICD-10-CM

## 2022-12-27 DIAGNOSIS — R0689 Other abnormalities of breathing: Secondary | ICD-10-CM

## 2022-12-27 NOTE — Telephone Encounter (Signed)
Raven can you check on this one

## 2022-12-27 NOTE — Telephone Encounter (Signed)
Pt. Case manger calling back needs to get home sleep study for pt. It was mentioned on AVS at last visit and he is wheelchair bound so trying to find a way pt. Can have the rest please advise and we need a PFT order placed Dr. Delane Ginger want pt. To have one done

## 2023-01-02 NOTE — Telephone Encounter (Signed)
Home sleep study order has been placed. I did not place an order for PFT because I did not see it mentioned in the last note.

## 2023-01-03 NOTE — Telephone Encounter (Addendum)
HST order placed with [facility].  Dr. Vassie Loll to read.

## 2023-01-18 ENCOUNTER — Encounter
Payer: No Typology Code available for payment source | Attending: Physical Medicine and Rehabilitation | Admitting: Psychology

## 2023-01-18 DIAGNOSIS — F5102 Adjustment insomnia: Secondary | ICD-10-CM

## 2023-01-18 DIAGNOSIS — M792 Neuralgia and neuritis, unspecified: Secondary | ICD-10-CM | POA: Insufficient documentation

## 2023-01-18 DIAGNOSIS — F518 Other sleep disorders not due to a substance or known physiological condition: Secondary | ICD-10-CM | POA: Insufficient documentation

## 2023-01-18 DIAGNOSIS — F4312 Post-traumatic stress disorder, chronic: Secondary | ICD-10-CM | POA: Diagnosis not present

## 2023-01-18 DIAGNOSIS — F064 Anxiety 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: Secondary | ICD-10-CM

## 2023-01-18 DIAGNOSIS — M2170 Unequal limb length (acquired), unspecified site: Secondary | ICD-10-CM | POA: Insufficient documentation

## 2023-01-18 DIAGNOSIS — R410 Disorientation, unspecified: Secondary | ICD-10-CM | POA: Insufficient documentation

## 2023-01-18 DIAGNOSIS — M869 Osteomyelitis, unspecified: Secondary | ICD-10-CM | POA: Insufficient documentation

## 2023-01-18 DIAGNOSIS — Z72820 Sleep deprivation: Secondary | ICD-10-CM | POA: Diagnosis not present

## 2023-01-18 DIAGNOSIS — F321 Major depressive disorder, single episode, moderate: Secondary | ICD-10-CM | POA: Insufficient documentation

## 2023-01-18 DIAGNOSIS — N3289 Other specified disorders of bladder: Secondary | ICD-10-CM | POA: Insufficient documentation

## 2023-01-18 DIAGNOSIS — G8921 Chronic pain due to trauma: Secondary | ICD-10-CM | POA: Insufficient documentation
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
## 2023-01-31 ENCOUNTER — Telehealth: Payer: Self-pay | Admitting: *Deleted

## 2023-01-31 MED ORDER — FENTANYL 75 MCG/HR TD PT72: 1.0000 | MEDICATED_PATCH | TRANSDERMAL | 0 refills | Status: DC

## 2023-01-31 MED ORDER — MORPHINE SULFATE 15 MG PO TABS: 15.0000 mg | ORAL_TABLET | ORAL | 0 refills | Status: DC | PRN

## 2023-01-31 NOTE — Telephone Encounter (Signed)
Requesting refill on Morphine and Fentanyl.

## 2023-01-31 NOTE — Telephone Encounter (Signed)
Spouse notified

## 2023-02-01 ENCOUNTER — Telehealth: Payer: Self-pay | Admitting: Internal Medicine

## 2023-02-01 NOTE — Telephone Encounter (Signed)
Arline Asp calling in to dicuss pt

## 2023-02-03 ENCOUNTER — Telehealth: Payer: Self-pay | Admitting: Family Medicine

## 2023-02-03 NOTE — Telephone Encounter (Signed)
Patient dropped off document Home Health Certificate (Order ID [id number]), to be filled out by provider. Patient requested to send it back via Fax . Document is located in providers tray at front office.Please advise

## 2023-02-06 ENCOUNTER — Encounter: Payer: Self-pay | Admitting: Physical Medicine and Rehabilitation

## 2023-02-06 ENCOUNTER — Encounter (HOSPITAL_BASED_OUTPATIENT_CLINIC_OR_DEPARTMENT_OTHER): Payer: No Typology Code available for payment source | Admitting: Physical Medicine and Rehabilitation

## 2023-02-06 VITALS — BP 129/82 | HR 103 | Ht 75.0 in

## 2023-02-06 DIAGNOSIS — F321 Major depressive disorder, single episode, moderate: Secondary | ICD-10-CM | POA: Diagnosis present

## 2023-02-06 DIAGNOSIS — N3289 Other specified disorders of bladder: Secondary | ICD-10-CM

## 2023-02-06 DIAGNOSIS — M792 Neuralgia and neuritis, unspecified: Secondary | ICD-10-CM | POA: Diagnosis present

## 2023-02-06 DIAGNOSIS — G8921 Chronic pain due to trauma: Secondary | ICD-10-CM | POA: Diagnosis present

## 2023-02-06 DIAGNOSIS — R410 Disorientation, unspecified: Secondary | ICD-10-CM | POA: Diagnosis present

## 2023-02-06 DIAGNOSIS — F518 Other sleep disorders not due to a substance or known physiological condition: Secondary | ICD-10-CM

## 2023-02-06 DIAGNOSIS — M869 Osteomyelitis, unspecified: Secondary | ICD-10-CM

## 2023-02-06 DIAGNOSIS — M2170 Unequal limb length (acquired), unspecified site: Secondary | ICD-10-CM

## 2023-02-06 MED ORDER — IMIPRAMINE HCL 25 MG PO TABS: 25.0000 mg | ORAL_TABLET | Freq: Three times a day (TID) | ORAL | 1 refills | Status: DC

## 2023-02-06 MED ORDER — PROMETHAZINE HCL 12.5 MG PO TABS: 12.5000 mg | ORAL_TABLET | Freq: Four times a day (QID) | ORAL | 1 refills | Status: DC | PRN

## 2023-02-06 MED ORDER — MORPHINE SULFATE 15 MG PO TABS: 15.0000 mg | ORAL_TABLET | ORAL | 0 refills | Status: DC | PRN

## 2023-02-06 MED ORDER — NALOXONE HCL 4 MG/0.1ML NA LIQD: NASAL | 3 refills | Status: DC

## 2023-02-06 MED ORDER — FENTANYL 75 MCG/HR TD PT72: 1.0000 | MEDICATED_PATCH | TRANSDERMAL | 0 refills | Status: DC

## 2023-02-06 MED ORDER — TRAZODONE HCL 50 MG PO TABS: 50.0000 mg | ORAL_TABLET | Freq: Every day | ORAL | 1 refills | Status: DC

## 2023-02-06 NOTE — Progress Notes (Signed)
Subjective:    Patient ID: Christopher [name], male    DOB: [birth year], 57 y.o.   MRN: [id number]  HPI   Pt is a 57 yr old male with hx of Multitrauma- causing L femoral neck fx, degloving of L hip to going/scrotum, bladder neck trauma- got SPC, developed compartment syndrome -s/p surgery for that; also diverting colostomy, skin grafts, and s/p trach and PEG- PEG is out. 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 S/P screw removal and on IV ABX for L hip osteomyelitis. 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  Has R severe ulnar neuropathy as well. O2 3L full time.   Here for f/u on multitrauma and chronic L hip osteomyelitis with associated chronic pain.      Things about the same.  Last 1-2 weeks, c/o more pain in L hip.  Not more drainage or more swollen.  Pt feels infection is "pushing back against him more".   Confusion has been more of an issue several months.  Hasn't seen Psychiatry since they cancelled appointment- psch was ill.      Saw Dr Shelva Majestic- chronically sleep deprived.  Recommended to get his more sunlight with sunrise.  Didn't touch much on cognitive with pt/wife- but put some in report.  Doesn't have report with her, and cnanot find in chart  Pt's wife- mother is sick and has a lot going on.   Still sleeping- erratically- doses then back up.   "Only called her once last night"  but wife said 2x.     Working on testing him for OSA- trying to do home study- working on getting it covered- so should occur soon.       Pain Inventory Average Pain 8 Pain Right Now 8 My pain is constant, sharp, burning, dull, stabbing, tingling, and aching  LOCATION OF PAIN  knee, back, buttock, groin   BOWEL  History of colostomy Yes    BLADDER Suprapubic In and out cath, frequency PRN Able to self cath No   Mobility ability to climb steps?  no do you drive?  no  Function I need assistance with the following:  feeding, dressing, bathing, toileting, meal prep, household duties, and shopping Do you have any goals in this area?  yes  Neuro/Psych weakness numbness tremor tingling trouble walking spasms dizziness confusion depression  Prior Studies Any changes since last visit?  no  Physicians involved in your care Any changes since last visit?  no   Family History  Problem Relation Age of Onset   Breast cancer Mother        with mets to the bones   Social History   Socioeconomic History   Marital status: Married    Spouse name: Not on file   Number of children: Not on file   Years of education: Not on file   Highest education level: Not on file  Occupational History   Occupation: Disable  Tobacco Use   Smoking status: Former    Current packs/day: 0.00    Average packs/day:  1 pack/day for 20.0 years (20.0 ttl pk-yrs)    Types: Cigarettes    Start date: 07/10/1998    Quit date: 07/10/2018    Years since quitting: 4.5   Smokeless tobacco: Never  Vaping Use   Vaping status: Never Used  Substance and Sexual Activity   Alcohol use: Never   Drug use: Yes    Types: Oxycodone, Fentanyl    Comment: Fentanyl patch/oxycodone since 06/2018   Sexual activity: Yes  Other Topics Concern   Not on file  Social History Narrative   ** Merged History Encounter **       Social Determinants of Health   Financial Resource Strain: Low Risk  (03/01/2019)   Overall Financial Resource Strain (CARDIA)    Difficulty of Paying Living Expenses: Not very hard  Food Insecurity: Food Insecurity  Present (03/01/2019)   Hunger Vital Sign    Worried About Running Out of Food in the Last Year: Sometimes true    Ran Out of Food in the Last Year: Sometimes true  Transportation Needs: No Transportation Needs (03/01/2019)   PRAPARE - Administrator, Civil Service (Medical): No    Lack of Transportation (Non-Medical): No  Physical Activity: Inactive (03/01/2019)   Exercise Vital Sign    Days of Exercise per Week: 0 days    Minutes of Exercise per Session: 0 min  Stress: Stress Concern Present (03/01/2019)   [employer] of Occupational Health - Occupational Stress Questionnaire    Feeling of Stress : Rather much  Social Connections: Not on file   Past Surgical History:  Procedure Laterality Date   APPLICATION OF A-CELL OF BACK N/A 08/06/2018   Procedure: Application Of A-Cell Of Back;  Surgeon: Peggye Form, DO;  [facility]: [facility];  Service: Plastics;  Laterality: N/A;   APPLICATION OF A-CELL OF EXTREMITY Left 08/06/2018   Procedure: Application Of A-Cell Of Extremity;  Surgeon: Peggye Form, DO;  [facility];  Service: Plastics;  Laterality: Left;   APPLICATION OF A-CELL OF EXTREMITY Left 09/18/2019   Procedure: APPLICATION OF A-CELL OF EXTREMITY;  Surgeon: Peggye Form, DO;  [facility];  Service: Plastics;  Laterality: Left;   APPLICATION OF WOUND VAC  07/12/2018   Procedure: Application Of Wound Vac to the Left Thigh and Scrotum.;  Surgeon: Roby Lofts, MD;  [facility];  Service: Orthopedics;;   APPLICATION OF WOUND VAC  07/10/2018   Procedure: Application Of Wound Vac;  Surgeon: Berna Bue, MD;  Location[facility] OR;  Service: General;;   BIOPSY  11/02/2022   Procedure: BIOPSY;  Surgeon: Lemar Lofty., MD;  Location: [facility];  Service: Gastroenterology;;   COLON SURGERY  2020   colostomy   COLOSTOMY N/A 07/23/2018   Procedure: COLOSTOMY;  Surgeon: Violeta Gelinas, MD;  Location: [facility] OR;  Service: General;  Laterality:  N/A;   CYSTOSCOPY W/ URETERAL STENT PLACEMENT N/A 07/15/2018   Procedure: RETROGRADE URETHROGRAM;  Surgeon: Marcine Matar, MD;  Location: [facility] OR;  Service: Urology;  Laterality: N/A;   CYSTOSCOPY WITH LITHOLAPAXY N/A 05/06/2019   Procedure: CYSTOSCOPY BASKET BLADDER STONE EXTRACTION;  Surgeon: Malen Gauze, MD;  Location: [facility];  Service: Urology;  Laterality: N/A;  30 MINS   CYSTOSTOMY N/A 05/06/2019   Procedure: REPLACEMENT OF SUPRAPUBIC CATHETER;  Surgeon: Malen Gauze, MD;  Location: [facility];  Service: Urology;  Laterality: N/A;   DEBRIDEMENT AND CLOSURE WOUND Left 03/04/2019   Procedure:  Excision of hip wound with placement of Acell;  Surgeon: Peggye Form, DO;  [facility];  Service: Plastics;  Laterality: Left;   ESOPHAGOGASTRODUODENOSCOPY N/A 08/14/2018   Procedure: ESOPHAGOGASTRODUODENOSCOPY (EGD);  Surgeon: Violeta Gelinas, MD;  Location: [facility];  Service: General;  Laterality: N/A;  bedside   ESOPHAGOGASTRODUODENOSCOPY (EGD) WITH PROPOFOL N/A 11/02/2022   Procedure: ESOPHAGOGASTRODUODENOSCOPY (EGD) WITH PROPOFOL;  Surgeon: Meridee Score Netty Starring., MD;  Location: [facility];  Service: Gastroenterology;  Laterality: N/A;   FACIAL RECONSTRUCTION SURGERY     X 2--once as a teenager and second time in his 33's   HARDWARE REMOVAL Left 03/04/2019   Procedure: Left Hip Hardware Removal;  Surgeon: Roby Lofts, MD;  [facility];  Service: Orthopedics;  Laterality: Left;   HEMOSTASIS CLIP PLACEMENT  11/02/2022   Procedure: HEMOSTASIS CLIP PLACEMENT;  Surgeon: Lemar Lofty., MD;  Location: [facility];  Service: Gastroenterology;;   HIP PINNING,CANNULATED Left 07/12/2018   Procedure: CANNULATED HIP PINNING;  Surgeon: Roby Lofts, MD;  [facility];  Service: Orthopedics;  Laterality: Left;   HIP SURGERY     HOLMIUM LASER APPLICATION Right 07/15/2019   Procedure: HOLMIUM LASER APPLICATION;  Surgeon:  Malen Gauze, MD;  Location: [facility];  Service: Urology;  Laterality: Right;   HOT HEMOSTASIS N/A 11/02/2022   Procedure: HOT HEMOSTASIS (ARGON PLASMA COAGULATION/BICAP);  Surgeon: Lemar Lofty., MD;  Location: [facility];  Service: Gastroenterology;  Laterality: N/A;   I & D EXTREMITY Left 07/25/2018   Procedure: Debridement of buttock, scrotum and left leg, placement of acell and vac;  Surgeon: Peggye Form, DO;  [facility];  Service: Plastics;  Laterality: Left;   I & D EXTREMITY N/A 08/06/2018   Procedure: Debridement of buttock, scrotum and left leg;  Surgeon: Peggye Form, DO;  [facility];  Service: Plastics;  Laterality: N/A;   I & D EXTREMITY N/A 08/13/2018   Procedure: Debridement of buttock, scrotum and left leg, placement of acell and vac;  Surgeon: Peggye Form, DO;  [facility];  Service: Plastics;  Laterality: N/A;  90 min, please   INCISION AND DRAINAGE HIP Left 09/18/2019   Procedure: IRRIGATION AND DEBRIDEMENT HIP/ PELVIS WITH WOUND VAC PLACEMENT;  Surgeon: Roby Lofts, MD;  [facility];  Service: Orthopedics;  Laterality: Left;   INCISION AND DRAINAGE OF WOUND N/A 07/18/2018   Procedure: Debridement of left leg, buttocks and scrotal wound with placement of acell and Flexiseal;  Surgeon: Peggye Form, DO;  [facility];  Service: Plastics;  Laterality: N/A;   INCISION AND DRAINAGE OF WOUND Left 08/29/2018   Procedure: Debridement of buttock, scrotum and left leg, placement of acell and vac;  Surgeon: Peggye Form, DO;  [facility];  Service: Plastics;  Laterality: Left;  75 min, please   INCISION AND DRAINAGE OF WOUND Bilateral 10/23/2018   Procedure: DEBRIDEMENT OF BUTTOCK,SCROTUM, AND LEG WOUNDS WITH PLACEMENT OF ACELL- BILATERAL 90 MIN;  Surgeon: Peggye Form, DO;  [facility];  Service: Plastics;  Laterality: Bilateral;   IR ANGIOGRAM PELVIS SELECTIVE OR SUPRASELECTIVE   07/10/2018   IR ANGIOGRAM PELVIS SELECTIVE OR SUPRASELECTIVE  07/10/2018   IR ANGIOGRAM SELECTIVE EACH ADDITIONAL VESSEL  07/10/2018   IR EMBO ART  VEN HEMORR LYMPH EXTRAV  INC GUIDE ROADMAPPING  07/10/2018   IR GASTROSTOMY TUBE MOD SED  01/21/2021   IR NEPHROSTOMY PLACEMENT LEFT  04/05/2019   IR NEPHROSTOMY  PLACEMENT RIGHT  05/31/2019   IR US GUIDE BX ASP/DRAIN  07/10/2018   IR US GUIDE VASC ACCESS RIGHT  07/10/2018   IR VENO/EXT/UNI LEFT  07/10/2018   IRRIGATION AND DEBRIDEMENT OF WOUND WITH SPLIT THICKNESS SKIN GRAFT Left 09/19/2018   Procedure: Debridement of gluteal wound with placement of acell to left leg.;  Surgeon: Peggye Form, DO;  [facility];  Service: Plastics;  Laterality: Left;  2.5 hours, please   LAPAROTOMY N/A 07/12/2018   Procedure: EXPLORATORY LAPAROTOMY;  Surgeon: Violeta Gelinas, MD;  Location: [facility] OR;  Service: General;  Laterality: N/A;   LAPAROTOMY N/A 07/15/2018   Procedure: WOUND EXPLORATION; CLOSURE OF ABDOMEN;  Surgeon: Violeta Gelinas, MD;  Location: [facility] OR;  Service: General;  Laterality: N/A;   LAPAROTOMY  07/10/2018   Procedure: Exploratory Laparotomy;  Surgeon: Berna Bue, MD;  Location: [facility] OR;  Service: General;;   MASS EXCISION Left 09/18/2019   Procedure: EXCISION UPPER LEFT INNER THIGH WOUND;  Surgeon: Peggye Form, DO;  [facility];  Service: Plastics;  Laterality: Left;   NEPHROLITHOTOMY Right 07/15/2019   Procedure: NEPHROLITHOTOMY PERCUTANEOUS;  Surgeon: Malen Gauze, MD;  Location: [facility];  Service: Urology;  Laterality: Right;  90 MINS   PEG PLACEMENT N/A 08/14/2018   Procedure: PERCUTANEOUS ENDOSCOPIC GASTROSTOMY (PEG) PLACEMENT;  Surgeon: Violeta Gelinas, MD;  Location: [facility] ENDOSCOPY;  Service: General;  Laterality: N/A;   PERCUTANEOUS TRACHEOSTOMY N/A 08/02/2018   Procedure: PERCUTANEOUS TRACHEOSTOMY;  Surgeon: Violeta Gelinas, MD;  Location: [facility] OR;  Service: General;  Laterality: N/A;   RADIOLOGY WITH  ANESTHESIA N/A 07/10/2018   Procedure: IR WITH ANESTHESIA;  Surgeon: Simonne Come, MD;  Location: [facility] OR;  Service: Radiology;  Laterality: N/A;   RADIOLOGY WITH ANESTHESIA Right 07/10/2018   Procedure: Ir With Anesthesia;  Surgeon: Simonne Come, MD;  Location: [facility] OR;  Service: Radiology;  Laterality: Right;   SAVORY DILATION N/A 11/02/2022   Procedure: SAVORY DILATION;  Surgeon: Meridee Score Netty Starring., MD;  Location: [facility];  Service: Gastroenterology;  Laterality: N/A;   SCROTAL EXPLORATION N/A 07/15/2018   Procedure: SCROTUM DEBRIDEMENT;  Surgeon: Marcine Matar, MD;  Location: [facility] OR;  Service: Urology;  Laterality: N/A;   SHOULDER SURGERY     SKIN SPLIT GRAFT Right 09/19/2018   Procedure: Skin Graft Split Thickness;  Surgeon: Peggye Form, DO;  [facility];  Service: Plastics;  Laterality: Right;   SKIN SPLIT GRAFT N/A 10/03/2018   Procedure: Split thickness skin graft to gluteal area with acell placement;  Surgeon: Peggye Form, DO;  Location[facility];  Service: Plastics;  Laterality: N/A;  3 hours, please   VACUUM ASSISTED CLOSURE CHANGE N/A 07/12/2018   Procedure: ABDOMINAL VACUUM ASSISTED CLOSURE CHANGE and abdominal washout;  Surgeon: Violeta Gelinas, MD;  Location: [facility] OR;  Service: General;  Laterality: N/A;   WOUND DEBRIDEMENT Left 07/23/2018   Procedure: DEBRIDEMENT LEFT BUTTOCK  WOUND;  Surgeon: Violeta Gelinas, MD;  Location: [facility] OR;  Service: General;  Laterality: Left;   WOUND EXPLORATION Left 07/10/2018   Procedure: WOUND EXPLORATION LEFT GROIN;  Surgeon: Berna Bue, MD;  Location[facility];  Service: General;  Laterality: Left;   Past Medical History:  Diagnosis Date   Acute on chronic respiratory failure with hypoxia (HCC) 06/2018   trach removed 11-16-2018, on vent from jan until may 2020 - uses albuterol prn   Anxiety    Bacteremia due to Pseudomonas 06/2018   Chronic osteomyelitis (HCC)  Chronic pain syndrome    Clostridium difficile colitis  10/30/2019   tx with abx    Depression    DVT (deep venous thrombosis) (HCC) 2020   right brachial post PICC line   History of blood transfusion 06/2018   History of Clostridioides difficile colitis    History of kidney stones    Hypertension    norvasc d/c by pcp on 11/05/19   Multiple traumatic injuries    Penile pain 11/18/2019   Pneumonia 11/2009   2020 x 2   Walker as ambulation aid    Wheelchair bound    electric   Wound discharge    left hip wound with bloody/clear drainage change dressing q day surgilube with gauze, between legs wound using calcium algenate pad bid   BP 129/82   Pulse (!) 103   Ht 6\' 3"  (1.905 m)   SpO2 95% Comment: 4 LT OF Oxygen  BMI 25.62 kg/m   Opioid Risk Score:   Fall Risk Score:  `1  Depression screen [facility] 2/9     02/06/2023   10:59 AM 12/26/2022   11:01 AM 11/14/2022   11:14 AM 11/14/2022   11:08 AM 11/01/2022   10:45 AM 09/30/2022   10:51 AM 08/17/2022    9:21 AM  Depression screen PHQ 2/9  Decreased Interest 1 1 1 1  0 2 1  Down, Depressed, Hopeless 1 1 1 1  0 3 1  PHQ - 2 Score 2 2 2 2  0 5 2  Altered sleeping     0 3   Tired, decreased energy     0 3   Change in appetite     0 3   Feeling bad or failure about yourself      0 3   Trouble concentrating     0 3   Moving slowly or fidgety/restless     0 1   Suicidal thoughts     0 0   PHQ-9 Score     0 21   Difficult doing work/chores     Not difficult at all Very difficult     Review of Systems  Musculoskeletal:  Positive for gait problem.       SPASMS  Neurological:  Positive for dizziness, tremors, weakness and numbness.       TINGLING, ANXIETY   Psychiatric/Behavioral:  Positive for confusion.   All other systems reviewed and are negative.      Objective:   Physical Exam  Awake, intermittently- keeps dozing off; in power w/c; accompanied by wife, NAD Keeps talking a little nonsense when just waking back up O2 by [place] [address] Hynogogic jerks intermittently.   Hearing music that's  not there- so auditory hallucinations.      Assessment & Plan:    Pt is a 57 yr old male with hx of Multitrauma- causing L femoral neck fx, degloving of L hip to going/scrotum, bladder neck trauma- got SPC, developed compartment syndrome -s/p surgery for that; also diverting colostomy, skin grafts, and s/p trach and PEG- PEG is out. 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 S/P screw removal and on IV ABX for L hip osteomyelitis. 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  B/L tennis elbow new-and B/L pending/forming ulnar neuropathy Osteomyelitis of L hip found again-on  PO vanc and Cipro  with new PEG- 7/22- and  colostomy from before.   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  Has R severe ulnar neuropathy as well. O2 3L full time.   Here for f/u on multitrauma and chronic L hip osteomyelitis with associated chronic pain.     Home Health- for chronic L hip osteomyelitis and confusion- will order nursing for decline in function as well as Physical therapy- encourage pt to participate.    2. L hip xray looked OK- so can participate.    3.  Needs to be treated, if possible, for likely Obstructive sleep apnea- trying to get diagnosed.    4.   Con't Duragesic and MSIR_last filled 01/31/23- so will refill today   5. Will give Narcan nasal spray-  since has been 3 years and just in case with high doses of pain meds he's on.    6. Doesn't need refills on Gabapentin- will con't   7. Needs Imipramine refills- con't- will send in 25 mg 3x/day- 3 months supply 1 refill.   8. Still takes Oxybutynin from Urology.   9. Will refill Phenergan 12.5 mg as needed #270- 1 refill.   10.  Discussed plan with worker's comp nurse   11. F/u 6 weeks- double appt- L  hip osteomyelitis. Confusion  12. Will address confusion if Psychiatry does not- at next appt  13. Trazodone 50 mg nightly- for sleep- won't do a higher dose for now since on Imipramine and Paxil. But needs to try and sleep at night.   14. Hearing hallucinations. Psychiatry to address?  I spent a total of 43   minutes on total care today- >50% coordination of care- due to  10 minutes d/w worke'rs comp- also d/w wife about plan- and what refills needed-

## 2023-02-06 NOTE — Patient Instructions (Addendum)
Pt is a 57 yr old male with hx of Multitrauma- causing L femoral neck fx, degloving of L hip to going/scrotum, bladder neck trauma- got SPC, developed compartment syndrome -s/p surgery for that; also diverting colostomy, skin grafts, and s/p trach and PEG- PEG is out. 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 S/P screw removal and on IV ABX for L hip osteomyelitis. 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  Has R severe ulnar neuropathy as well. O2 3L full time.   Here for f/u on multitrauma and chronic L hip osteomyelitis with associated chronic pain.     Home Health- for chronic L hip osteomyelitis and confusion- will order nursing for decline in function as well as Physical therapy- encourage pt to participate.    2. L hip xray looked OK- so can participate.    3.  Needs to be treated, if possible, for likely Obstructive sleep apnea- trying to get diagnosed.    4.   Con't Duragesic and MSIR_last filled 01/31/23- so will refill today   5. Will give Narcan nasal spray-  since has been 3 years and just in case with high doses of pain meds he's on.    6. Doesn't need refills on Gabapentin- will con't   7. Needs Imipramine refills- con't- will send in 25 mg 3x/day- 3 months supply 1 refill.   8. Still takes Oxybutynin from Urology.   9. Will refill Phenergan 12.5 mg as needed #270- 1 refill.   10.  Discussed plan with worker's comp nurse   11. F/u 6 weeks- double appt- L hip osteomyelitis. Confusion  12. Will address confusion if Psychiatry  does not-  at next appt.   13. Trazodone 50 mg nightly- for sleep- won't do a higher dose for now since on Imipramine and Paxil. But needs to try and sleep at night.   14. Hearing hallucinations. Psychiatry to address?

## 2023-02-08 ENCOUNTER — Ambulatory Visit: Payer: PRIVATE HEALTH INSURANCE | Admitting: Gastroenterology

## 2023-02-08 ENCOUNTER — Ambulatory Visit (INDEPENDENT_AMBULATORY_CARE_PROVIDER_SITE_OTHER): Payer: Self-pay | Admitting: Nurse Practitioner

## 2023-02-08 ENCOUNTER — Encounter: Payer: Self-pay | Admitting: Nurse Practitioner

## 2023-02-08 VITALS — BP 130/86 | HR 100 | Ht 75.0 in

## 2023-02-08 DIAGNOSIS — K219 Gastro-esophageal reflux disease without esophagitis: Secondary | ICD-10-CM

## 2023-02-08 DIAGNOSIS — K316 Fistula of stomach and duodenum: Secondary | ICD-10-CM

## 2023-02-08 NOTE — Patient Instructions (Addendum)
_______________________________________________________  If your blood pressure at your visit was 140/90 or greater, please contact your primary care physician to follow up on this.  If you are age 55 or younger, your body mass index should be between 19-25. Your Body mass index is 25.62 kg/m. If this is out of the aformentioned range listed, please consider follow up with your Primary Care Provider.  ________________________________________________________  The [facility] providers would like to encourage you to use [facility] to communicate with providers for non-urgent requests or questions.  Due to long hold times on the telephone, sending your provider a message by [facility] may be a faster and more efficient way to get a response.  Please allow 48 business hours for a response.  Please remember that this is for non-urgent requests.  _______________________________________________________  CONTINUE: pantoprazole 40mg  one tablet daily 30 MINUTES PRIOR TO BREAKFAST MEAL EACH DAY. CONTINUE: Pepcid 40mg  at bedtime each day  You will follow up in our office on an as needed basis.  Thank you for entrusting me with your care and choosing [facility].  Willette Cluster, NP

## 2023-02-08 NOTE — Progress Notes (Signed)
____________________________________________________________  Attending physician addendum:  Thank you for sending this case to me. I have reviewed the entire note and agree with the plan.  No more imaging is needed.  The endoscopic closure clip may remain in their long-term or possibly fall off.  Either way is okay as long as the fistula remains closed.  Amada Jupiter, MD  ____________________________________________________________
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
## 2023-02-09 NOTE — Telephone Encounter (Signed)
All orders seem to be handled at this time per [facility].

## 2023-02-10 NOTE — Telephone Encounter (Signed)
Form faxed to [phone] Form placed to be scan in patient chart

## 2023-03-07 ENCOUNTER — Encounter: Payer: Self-pay | Admitting: Psychology

## 2023-03-20 ENCOUNTER — Encounter
Payer: No Typology Code available for payment source | Attending: Physical Medicine and Rehabilitation | Admitting: Physical Medicine and Rehabilitation

## 2023-03-20 ENCOUNTER — Encounter: Payer: Self-pay | Admitting: Physical Medicine and Rehabilitation

## 2023-03-20 VITALS — BP 117/78 | HR 99

## 2023-03-20 DIAGNOSIS — N3289 Other specified disorders of bladder: Secondary | ICD-10-CM | POA: Insufficient documentation

## 2023-03-20 DIAGNOSIS — R413 Other amnesia: Secondary | ICD-10-CM | POA: Insufficient documentation

## 2023-03-20 DIAGNOSIS — M792 Neuralgia and neuritis, unspecified: Secondary | ICD-10-CM | POA: Insufficient documentation

## 2023-03-20 DIAGNOSIS — Z9359 Other cystostomy status: Secondary | ICD-10-CM | POA: Insufficient documentation

## 2023-03-20 DIAGNOSIS — Z993 Dependence on wheelchair: Secondary | ICD-10-CM | POA: Diagnosis present

## 2023-03-20 DIAGNOSIS — G8921 Chronic pain due to trauma: Secondary | ICD-10-CM | POA: Insufficient documentation

## 2023-03-20 MED ORDER — FENTANYL 75 MCG/HR TD PT72: 1.0000 | MEDICATED_PATCH | TRANSDERMAL | 0 refills | Status: DC

## 2023-03-20 MED ORDER — MORPHINE SULFATE 15 MG PO TABS: 15.0000 mg | ORAL_TABLET | ORAL | 0 refills | Status: DC | PRN
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
## 2023-03-20 NOTE — Progress Notes (Signed)
Subjective:    Patient ID: Christopher [name], male    DOB: 10/10/1965, 57 y.o.   MRN: [id number]  HPI Pt is a 57 yr old male with hx of Multitrauma- causing L femoral neck fx, degloving of L hip to going/scrotum, bladder neck trauma- got SPC, developed compartment syndrome -s/p surgery for that; also diverting colostomy, skin grafts, and s/p trach and PEG- PEG is out. 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 S/P screw removal and on IV ABX for L hip osteomyelitis. 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  Has R severe ulnar neuropathy as well. O2 3L full time.   Here for f/u on multitrauma and chronic L hip osteomyelitis with associated chronic pain.     Things about the same.  Saw Dr Dawna Part 2 weeks ago.   Didn't agree with Neuropsychology notes.  Kept meds the same.  Recommended for him to get counselor.  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    Started PT H/H-  Monday and Friday- he participated and walked a little- did stretching exercises Going to do 1x/week from now on.  Has been more sore since last Friday, and was concerned hurt self.   Has been thinking about weights-    Using pain meds every 3 hours- much more frequent lately.  C/o L hip- no extra drainage, no more swelling.   Hurts more when colder.  The best he's been with this time of year that has in last few years.  Two pains is bladder and L hip-  not neck, shoulders, etc.   Back is flared up as well per pt.    Given Paxil in AM and  not at night- hasn't noticed a difference.   Pain Inventory Average Pain 8 Pain Right Now 8 My pain is constant, sharp, burning, stabbing, and aching  LOCATION OF PAIN  buttocks, groin, hip, knee, leg  BOWEL Number of stools per week: . Oral laxative use  . Type of laxative . Enema or suppository use No  History of colostomy No  Incontinent No   BLADDER Suprapubic In and out cath, frequency monthly/prn Able to self cath  . Bladder incontinence No  Frequent urination No  Leakage with coughing No  Difficulty starting stream No  Incomplete bladder emptying No    Mobility use a walker how many minutes can you walk? 2 ability to climb steps?  no do you drive?  no use a wheelchair needs help with transfers  Function Do you have any goals in this area?  no  Neuro/Psych weakness numbness tremor tingling trouble walking spasms dizziness confusion depression anxiety  Prior Studies Any changes since last visit?  no  Physicians involved in your care Any changes since last visit?  no   Family History  Problem Relation Age of Onset   Breast cancer Mother        with mets to the bones   Social History   Socioeconomic History   Marital status: Married  Spouse name: Not on file   Number of children: Not on file   Years of education: Not on file   Highest education level: Not on file  Occupational History   Occupation: Disable  Tobacco Use   Smoking status: Former    Current packs/day: 0.00    Average packs/day: 1 pack/day for 20.0 years (20.0 ttl pk-yrs)    Types: Cigarettes    Start date: 07/10/1998    Quit date: 07/10/2018    Years since quitting: 4.6   Smokeless tobacco: Never  Vaping Use   Vaping status: Never Used  Substance and Sexual Activity   Alcohol use: Never   Drug use: Yes    Types: Oxycodone, Fentanyl    Comment: Fentanyl patch/oxycodone since 06/2018   Sexual activity: Yes  Other Topics Concern   Not on file  Social History  Narrative   ** Merged History Encounter **       Social Determinants of Health   Financial Resource Strain: Low Risk  (03/01/2019)   Overall Financial Resource Strain (CARDIA)    Difficulty of Paying Living Expenses: Not very hard  Food Insecurity: Food Insecurity Present (03/01/2019)   Hunger Vital Sign    Worried About Running Out of Food in the Last Year: Sometimes true    Ran Out of Food in the Last Year: Sometimes true  Transportation Needs: No Transportation Needs (03/01/2019)   PRAPARE - Administrator, Civil Service (Medical): No    Lack of Transportation (Non-Medical): No  Physical Activity: Inactive (03/01/2019)   Exercise Vital Sign    Days of Exercise per Week: 0 days    Minutes of Exercise per Session: 0 min  Stress: Stress Concern Present (03/01/2019)   [employer] of Occupational Health - Occupational Stress Questionnaire    Feeling of Stress : Rather much  Social Connections: Not on file   Past Surgical History:  Procedure Laterality Date   APPLICATION OF A-CELL OF BACK N/A 08/06/2018   Procedure: Application Of A-Cell Of Back;  Surgeon: Peggye Form, DO;  Location: [facility];  Service: Plastics;  Laterality: N/A;   APPLICATION OF A-CELL OF EXTREMITY Left 08/06/2018   Procedure: Application Of A-Cell Of Extremity;  Surgeon: Peggye Form, DO;  Location: [facility];  Service: Plastics;  Laterality: Left;   APPLICATION OF A-CELL OF EXTREMITY Left 09/18/2019   Procedure: APPLICATION OF A-CELL OF EXTREMITY;  Surgeon: Peggye Form, DO;  Location: [facility];  Service: Plastics;  Laterality: Left;   APPLICATION OF WOUND VAC  07/12/2018   Procedure: Application Of Wound Vac to the Left Thigh and Scrotum.;  Surgeon: Roby Lofts, MD;  Location: [facility];  Service: Orthopedics;;   APPLICATION OF WOUND VAC  07/10/2018   Procedure: Application Of Wound Vac;  Surgeon: Berna Bue, MD;  Location: [facility] OR;  Service: General;;   BIOPSY  11/02/2022    Procedure: BIOPSY;  Surgeon: Lemar Lofty., MD;  Location: [facility];  Service: Gastroenterology;;   COLON SURGERY  2020   colostomy   COLOSTOMY N/A 07/23/2018   Procedure: COLOSTOMY;  Surgeon: Violeta Gelinas, MD;  Location: [facility] OR;  Service: General;  Laterality: N/A;   CYSTOSCOPY W/ URETERAL STENT PLACEMENT N/A 07/15/2018   Procedure: RETROGRADE URETHROGRAM;  Surgeon: Marcine Matar, MD;  [facility];  Service: Urology;  Laterality: N/A;   CYSTOSCOPY WITH LITHOLAPAXY N/A 05/06/2019   Procedure: CYSTOSCOPY BASKET BLADDER STONE EXTRACTION;  Surgeon: Malen Gauze, MD;  Location: [facility];  Service: Urology;  Laterality: N/A;  30 MINS   CYSTOSTOMY N/A 05/06/2019   Procedure: REPLACEMENT OF SUPRAPUBIC CATHETER;  Surgeon: Malen Gauze, MD;  Location: [facility];  Service: Urology;  Laterality: N/A;   DEBRIDEMENT AND CLOSURE WOUND Left 03/04/2019   Procedure: Excision of hip wound with placement of Acell;  Surgeon: Peggye Form, DO;  [facility];  Service: Plastics;  Laterality: Left;   ESOPHAGOGASTRODUODENOSCOPY N/A 08/14/2018   Procedure: ESOPHAGOGASTRODUODENOSCOPY (EGD);  Surgeon: Violeta Gelinas, MD;  Location: [facility];  Service: General;  Laterality: N/A;  bedside   ESOPHAGOGASTRODUODENOSCOPY (EGD) WITH PROPOFOL N/A 11/02/2022   Procedure: ESOPHAGOGASTRODUODENOSCOPY (EGD) WITH PROPOFOL;  Surgeon: Meridee Score Netty Starring., MD;  Location: [facility];  Service: Gastroenterology;  Laterality: N/A;   FACIAL RECONSTRUCTION SURGERY     X 2--once as a teenager and second time in his 39's   HARDWARE REMOVAL Left 03/04/2019   Procedure: Left Hip Hardware Removal;  Surgeon: Roby Lofts, MD;  Location: [facility];  Service: Orthopedics;  Laterality: Left;   HEMOSTASIS CLIP PLACEMENT  11/02/2022   Procedure: HEMOSTASIS CLIP PLACEMENT;  Surgeon: Lemar Lofty., MD;  Location: [facility];  Service: Gastroenterology;;    HIP PINNING,CANNULATED Left 07/12/2018   Procedure: CANNULATED HIP PINNING;  Surgeon: Roby Lofts, MD;  [facility];  Service: Orthopedics;  Laterality: Left;   HIP SURGERY     HOLMIUM LASER APPLICATION Right 07/15/2019   Procedure: HOLMIUM LASER APPLICATION;  Surgeon: Malen Gauze, MD;  Location: [facility];  Service: Urology;  Laterality: Right;   HOT HEMOSTASIS N/A 11/02/2022   Procedure: HOT HEMOSTASIS (ARGON PLASMA COAGULATION/BICAP);  Surgeon: Lemar Lofty., MD;  Location: [facility];  Service: Gastroenterology;  Laterality: N/A;   I & D EXTREMITY Left 07/25/2018   Procedure: Debridement of buttock, scrotum and left leg, placement of acell and vac;  Surgeon: Peggye Form, DO;  [facility];  Service: Plastics;  Laterality: Left;   I & D EXTREMITY N/A 08/06/2018   Procedure: Debridement of buttock, scrotum and left leg;  Surgeon: Peggye Form, DO;  [facility]: [facility];  Service: Plastics;  Laterality: N/A;   I & D EXTREMITY N/A 08/13/2018   Procedure: Debridement of buttock, scrotum and left leg, placement of acell and vac;  Surgeon: Peggye Form, DO;  [facility]: [facility];  Service: Plastics;  Laterality: N/A;  90 min, please   INCISION AND DRAINAGE HIP Left 09/18/2019   Procedure: IRRIGATION AND DEBRIDEMENT HIP/ PELVIS WITH WOUND VAC PLACEMENT;  Surgeon: Roby Lofts, MD;  [facility]: [facility];  Service: Orthopedics;  Laterality: Left;   INCISION AND DRAINAGE OF WOUND N/A 07/18/2018   Procedure: Debridement of left leg, buttocks and scrotal wound with placement of acell and Flexiseal;  Surgeon: Peggye Form, DO;  [facility]: [facility];  Service: Plastics;  Laterality: N/A;   INCISION AND DRAINAGE OF WOUND Left 08/29/2018   Procedure: Debridement of buttock, scrotum and left leg, placement of acell and vac;  Surgeon: Peggye Form, DO;  [facility]: [facility];  Service: Plastics;  Laterality: Left;  75 min, please   INCISION AND DRAINAGE OF  WOUND Bilateral 10/23/2018   Procedure: DEBRIDEMENT OF BUTTOCK,SCROTUM, AND LEG WOUNDS WITH PLACEMENT OF ACELL- BILATERAL 90 MIN;  Surgeon: Peggye Form, DO;  Location: [facility];  Service: Plastics;  Laterality: Bilateral;   IR ANGIOGRAM PELVIS SELECTIVE OR SUPRASELECTIVE  07/10/2018  IR ANGIOGRAM PELVIS SELECTIVE OR SUPRASELECTIVE  07/10/2018   IR ANGIOGRAM SELECTIVE EACH ADDITIONAL VESSEL  07/10/2018   IR EMBO ART  VEN HEMORR LYMPH EXTRAV  INC GUIDE ROADMAPPING  07/10/2018   IR GASTROSTOMY TUBE MOD SED  01/21/2021   IR NEPHROSTOMY PLACEMENT LEFT  04/05/2019   IR NEPHROSTOMY PLACEMENT RIGHT  05/31/2019   IR US GUIDE BX ASP/DRAIN  07/10/2018   IR US GUIDE VASC ACCESS RIGHT  07/10/2018   IR VENO/EXT/UNI LEFT  07/10/2018   IRRIGATION AND DEBRIDEMENT OF WOUND WITH SPLIT THICKNESS SKIN GRAFT Left 09/19/2018   Procedure: Debridement of gluteal wound with placement of acell to left leg.;  Surgeon: Peggye Form, DO;  Location: [facility];  Service: Plastics;  Laterality: Left;  2.5 hours, please   LAPAROTOMY N/A 07/12/2018   Procedure: EXPLORATORY LAPAROTOMY;  Surgeon: Violeta Gelinas, MD;  Location: [facility] OR;  Service: General;  Laterality: N/A;   LAPAROTOMY N/A 07/15/2018   Procedure: WOUND EXPLORATION; CLOSURE OF ABDOMEN;  Surgeon: Violeta Gelinas, MD;  Location: [facility] OR;  Service: General;  Laterality: N/A;   LAPAROTOMY  07/10/2018   Procedure: Exploratory Laparotomy;  Surgeon: Berna Bue, MD;  Location: [facility] OR;  Service: General;;   MASS EXCISION Left 09/18/2019   Procedure: EXCISION UPPER LEFT INNER THIGH WOUND;  Surgeon: Peggye Form, DO;  [facility];  Service: Plastics;  Laterality: Left;   NEPHROLITHOTOMY Right 07/15/2019   Procedure: NEPHROLITHOTOMY PERCUTANEOUS;  Surgeon: Malen Gauze, MD;  Location: [facility];  Service: Urology;  Laterality: Right;  90 MINS   PEG PLACEMENT N/A 08/14/2018   Procedure: PERCUTANEOUS ENDOSCOPIC GASTROSTOMY (PEG) PLACEMENT;   Surgeon: Violeta Gelinas, MD;  Location: [facility] END[facility];  Service: General;  Laterality: N/A;   PERCUTANEOUS TRACHEOSTOMY N/A 08/02/2018   Procedure: PERCUTANEOUS TRACHEOSTOMY;  Surgeon: Violeta Gelinas, MD;  Location: [facility] OR;  Service: General;  Laterality: N/A;   RADIOLOGY WITH ANESTHESIA N/A 07/10/2018   Procedure: IR WITH ANESTHESIA;  Surgeon: Simonne Come, MD;  Location: [facility] OR;  Service: Radiology;  Laterality: N/A;   RADIOLOGY WITH ANESTHESIA Right 07/10/2018   Procedure: Ir With Anesthesia;  Surgeon: Simonne Come, MD;  Location: [facility] OR;  Service: Radiology;  Laterality: Right;   SAVORY DILATION N/A 11/02/2022   Procedure: SAVORY DILATION;  Surgeon: Meridee Score Netty Starring., MD;  Location: [facility];  Service: Gastroenterology;  Laterality: N/A;   SCROTAL EXPLORATION N/A 07/15/2018   Procedure: SCROTUM DEBRIDEMENT;  Surgeon: Marcine Matar, MD;  Location: [facility] OR;  Service: Urology;  Laterality: N/A;   SHOULDER SURGERY     SKIN SPLIT GRAFT Right 09/19/2018   Procedure: Skin Graft Split Thickness;  Surgeon: Peggye Form, DO;  Location: [facility];  Service: Plastics;  Laterality: Right;   SKIN SPLIT GRAFT N/A 10/03/2018   Procedure: Split thickness skin graft to gluteal area with acell placement;  Surgeon: Peggye Form, DO;  Location: [facility];  Service: Plastics;  Laterality: N/A;  3 hours, please   VACUUM ASSISTED CLOSURE CHANGE N/A 07/12/2018   Procedure: ABDOMINAL VACUUM ASSISTED CLOSURE CHANGE and abdominal washout;  Surgeon: Violeta Gelinas, MD;  Location: [facility] OR;  Service: General;  Laterality: N/A;   WOUND DEBRIDEMENT Left 07/23/2018   Procedure: DEBRIDEMENT LEFT BUTTOCK  WOUND;  Surgeon: Violeta Gelinas, MD;  Location: [facility] OR;  Service: General;  Laterality: Left;   WOUND EXPLORATION Left 07/10/2018   Procedure: WOUND EXPLORATION LEFT GROIN;  Surgeon: Berna Bue, MD;  Location: [facility] OR;  Service:  General;  Laterality: Left;   Past Medical History:  Diagnosis Date   Acute on  chronic respiratory failure with hypoxia (HCC) 06/2018   trach removed 11-16-2018, on vent from jan until may 2020 - uses albuterol prn   Anxiety    Bacteremia due to Pseudomonas 06/2018   Chronic osteomyelitis (HCC)    Chronic pain syndrome    Clostridium difficile colitis 10/30/2019   tx with abx    Depression    DVT (deep venous thrombosis) (HCC) 2020   right brachial post PICC line   History of blood transfusion 06/2018   History of Clostridioides difficile colitis    History of kidney stones    Hypertension    norvasc d/c by pcp on 11/05/19   Multiple traumatic injuries    Penile pain 11/18/2019   Pneumonia 11/2009   2020 x 2   Walker as ambulation aid    Wheelchair bound    electric   Wound discharge    left hip wound with bloody/clear drainage change dressing q day surgilube with gauze, between legs wound using calcium algenate pad bid   BP 117/78   Pulse 99   SpO2 95%   Opioid Risk Score:   Fall Risk Score:  `1  Depression screen [facility] 2/9     02/06/2023   10:59 AM 12/26/2022   11:01 AM 11/14/2022   11:14 AM 11/14/2022   11:08 AM 11/01/2022   10:45 AM 09/30/2022   10:51 AM 08/17/2022    9:21 AM  Depression screen PHQ 2/9  Decreased Interest 1 1 1 1  0 2 1  Down, Depressed, Hopeless 1 1 1 1  0 3 1  PHQ - 2 Score 2 2 2 2  0 5 2  Altered sleeping     0 3   Tired, decreased energy     0 3   Change in appetite     0 3   Feeling bad or failure about yourself      0 3   Trouble concentrating     0 3   Moving slowly or fidgety/restless     0 1   Suicidal thoughts     0 0   PHQ-9 Score     0 21   Difficult doing work/chores     Not difficult at all Very difficult      Review of Systems  Musculoskeletal:        Pain in buttocks, groin, hip, knee, leg  Neurological:  Positive for dizziness, tremors, weakness and numbness.  Psychiatric/Behavioral:  Positive for confusion and dysphoric mood. The patient is nervous/anxious.   All other systems reviewed and are negative.      Objective:   Physical Exam  Awake, alert, more interactive today, in power w/c; accompanied by wife, NAD C/o hearing 50's music which is his hallucination.  Wearing O2 4L Leaning to R significantly.        Assessment & Plan:   Pt is a 57 yr old male with hx of Multitrauma- causing L femoral neck fx, degloving of L hip to going/scrotum, bladder neck trauma- got SPC, developed compartment syndrome -s/p surgery for that; also diverting colostomy, skin grafts, and s/p trach and PEG- PEG is out. 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 S/P screw removal and on IV ABX for L hip osteomyelitis. 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  Has R severe ulnar neuropathy as well. O2 3L full time.   Here for f/u on multitrauma and chronic L hip osteomyelitis with associated chronic pain.     Has weights- suggest using 2-3x/week-  5 -10 repetitions-  of biceps, triceps, and arm lifts.  - wait to add more- until we see each other again.    2. Can also do therabands- for legs and arms as well.    3. Will be less sore with therapy- if does some exercise on days doesn't have therapy- as long as don't do day BEFORE therapy.    4.  MSIR 15 mg q3 hours as needed #200- wrote refills   5. Con't Duragesic 75 mg q2 days- refill written  6. Cont' Gabapenitn 300 mg TID- has refills  7. Not using Trazodone- but taking Paxil 40 mg qday   8. Waiting for results for sleep study- will send to Pulmonology.    9. F/U in 6 weeks- double appt-  F/u on chronic  pain and hallucinations.    I spent a total of 24   minutes on total care today- >50% coordination of care- due to renewing meds; discussing hallucinations and weight to improve endurance.

## 2023-03-21 ENCOUNTER — Encounter (INDEPENDENT_AMBULATORY_CARE_PROVIDER_SITE_OTHER): Payer: Self-pay

## 2023-03-21 DIAGNOSIS — R400 Somnolence: Secondary | ICD-10-CM

## 2023-03-21 DIAGNOSIS — R0683 Snoring: Secondary | ICD-10-CM

## 2023-03-21 DIAGNOSIS — R0689 Other abnormalities of breathing: Secondary | ICD-10-CM

## 2023-04-04 ENCOUNTER — Other Ambulatory Visit: Payer: Self-pay | Admitting: *Deleted

## 2023-04-04 MED ORDER — FUROSEMIDE 40 MG PO TABS: 40.0000 mg | ORAL_TABLET | Freq: Every day | ORAL | 1 refills | Status: DC

## 2023-04-04 MED ORDER — ALBUTEROL SULFATE HFA 108 (90 BASE) MCG/ACT IN AERS: INHALATION_SPRAY | RESPIRATORY_TRACT | 1 refills | Status: DC

## 2023-04-10 ENCOUNTER — Ambulatory Visit (INDEPENDENT_AMBULATORY_CARE_PROVIDER_SITE_OTHER): Payer: No Typology Code available for payment source | Admitting: Family

## 2023-04-10 ENCOUNTER — Encounter: Payer: Self-pay | Admitting: Family

## 2023-04-10 ENCOUNTER — Other Ambulatory Visit: Payer: Self-pay

## 2023-04-10 VITALS — BP 143/89 | HR 98 | Temp 98.4°F

## 2023-04-10 DIAGNOSIS — A498 Other bacterial infections of unspecified site: Secondary | ICD-10-CM

## 2023-04-10 DIAGNOSIS — M86652 Other chronic osteomyelitis, left thigh: Secondary | ICD-10-CM | POA: Diagnosis not present

## 2023-04-10 DIAGNOSIS — F419 Anxiety disorder, unspecified: Secondary | ICD-10-CM

## 2023-04-10 DIAGNOSIS — F32A Depression, unspecified: Secondary | ICD-10-CM

## 2023-04-10 DIAGNOSIS — B965 Pseudomonas (aeruginosa) (mallei) (pseudomallei) as the cause of diseases classified elsewhere: Secondary | ICD-10-CM | POA: Diagnosis not present

## 2023-04-10 DIAGNOSIS — Z9189 Other specified personal risk factors, not elsewhere classified: Secondary | ICD-10-CM | POA: Insufficient documentation

## 2023-04-10 DIAGNOSIS — M8668 Other chronic osteomyelitis, other site: Secondary | ICD-10-CM

## 2023-04-10 NOTE — Assessment & Plan Note (Signed)
Christopher [name] has been having increased levels of anxiety of unclear origin and continues to work with psychiatry.  He is having positive symptoms and has auditory hallucinations at times.  Appears slightly anxious today.  Continue management per psychiatry and psychology.

## 2023-04-10 NOTE — Assessment & Plan Note (Signed)
Mr. Voelkel remains at risk for C. difficile infection with long-term/chronic use of ciprofloxacin.  Continue current dose of oral vancomycin for prophylaxis.

## 2023-04-10 NOTE — Patient Instructions (Addendum)
Nice to see you.  Continue to take your medication daily as prescribed.  Refills are at the pharmacy.  Plan for follow up in 6 months or sooner if needed   Have a great day and stay safe!

## 2023-04-10 NOTE — Assessment & Plan Note (Signed)
Mr. Bynum continues to have good suppression of multidrug-resistant Pseudomonas infection with chronic osteomyelitis of the hip and pelvis.  Tolerating medication with no adverse side effects and is without diarrhea or muscle pains.  Continue current dose of ciprofloxacin and oral vancomycin.

## 2023-04-10 NOTE — Progress Notes (Signed)
Subjective:    Patient ID: Christopher [name], male    DOB: 09/26/1965, 57 y.o.   MRN: [id number]  Chief Complaint  Patient presents with   Follow-up   Osteomyelitis     HPI:  Christopher [name] is a 57 y.o. male with with multiple pelvic fractures sustained from trauma complicated by hardware associated osteomyelitis status post hardware and excision of bone with cultures positive for multidrug-resistant Pseudomonas and maintained on ciprofloxacin and oral vancomycin last seen on 10/27/2022 with good adherence and tolerance to ciprofloxacin and oral vancomycin.  Here today with wife Christopher [name] and caseworker for routine follow-up.  Christopher [name] has been doing okay since his last office visit although over the last several days has been dealing with increased levels of anxiety which generally waxes and wanes.  Evaluated by psychiatry previously and sees them every 6 weeks.  Does have some auditory hallucinations at times hearing voices/music or footsteps that are not really there.  Medication is currently being adjusted by psychiatry and neuropsychiatry in addition to starting counseling.  Has been working with physical therapy and able to walk approximately 10-15 steps.  Has not been sleeping well.  No change in hip wound with occasional drainage.  No fevers or chills.  Continues to take ciprofloxacin and oral vancomycin with no adverse side effects or problems obtaining from the pharmacy.   Allergies  Allergen Reactions   Methadone Other (See Comments)    Hallucinations/confusion      Outpatient Medications Prior to Visit  Medication Sig Dispense Refill   albuterol (VENTOLIN HFA) 108 (90 Base) MCG/ACT inhaler INHALE 2 PUFFS BY MOUTH EVERY 6 HOURS AS NEEDED FOR WHEEZE OR SHORTNESS OF BREATH 18 each 1   Blood Pressure Monitoring (BLOOD PRESSURE CUFF) MISC Use daily as needed to check blood pressure. 1 each 0   Budeson-Glycopyrrol-Formoterol (BREZTRI AEROSPHERE) 160-9-4.8 MCG/ACT AERO Inhale 2  puffs into the lungs in the morning and at bedtime. 1 each 5   ciprofloxacin (CIPRO) 500 MG tablet Take 1 tablet (500 mg total) by mouth 2 (two) times daily. 60 tablet 11   dextromethorphan-guaiFENesin (MUCINEX DM) 30-600 MG 12hr tablet Take 2 tablets by mouth 2 (two) times daily.     famotidine (PEPCID) 40 MG tablet TAKE 1 TABLET BY MOUTH TWICE A DAY (Patient taking differently: Take 40 mg by mouth at bedtime.) 180 tablet 1   fentaNYL (DURAGESIC) 75 MCG/HR Place 1 patch onto the skin every other day. For chronic pain 15 patch 0   furosemide (LASIX) 40 MG tablet Take 1 tablet (40 mg total) by mouth daily. 90 tablet 1   gabapentin (NEURONTIN) 300 MG capsule TAKE 1 CAPSULE BY MOUTH THREE TIMES A DAY 90 capsule 5   GEMTESA 75 MG TABS Take 75 mg by mouth daily.     hydrochlorothiazide (HYDRODIURIL) 12.5 MG tablet Take 1 tablet (12.5 mg total) by mouth daily. 90 tablet 3   LINZESS 72 MCG capsule TAKE 2 CAPSULES (145 MCG TOTAL) BY MOUTH DAILY BEFORE BREAKFAST. 30 capsule 5   LORazepam (ATIVAN) 0.5 MG tablet Take 0.5 mg by mouth 3 (three) times daily.     melatonin ([employer] MELATONIN) 3 MG TABS tablet Take 1 tablet (3 mg total) by mouth at bedtime. 10 tablet 0   morphine (MSIR) 15 MG tablet Take 1 tablet (15 mg total) by mouth every 3 (three) hours as needed for severe pain. 200 tablet 0   Multiple Vitamin (MULTIVITAMIN WITH MINERALS) TABS tablet Take 1 tablet by mouth  daily.     naloxone (NARCAN) nasal spray 4 mg/0.1 mL To use if pt develops unconsciousness or confusion that family thinks is related to opioids. 1 each 3   OLANZapine (ZYPREXA) 5 MG tablet Take 2.5-10 mg by mouth See admin instructions. 2.5 mgh in the morning, and midday, and 10 mg at bedtime     oxybutynin (DITROPAN) 5 MG tablet Take 5 mg by mouth See admin instructions. Take 5 mg by mouth three times a day and an additional 5 mg once daily as needed for urinary urgency     pantoprazole (PROTONIX) 40 MG tablet TAKE 1 TABLET BY MOUTH EVERY DAY  (Patient taking differently: Take 40 mg by mouth daily with breakfast.) 90 tablet 1   PARoxetine (PAXIL) 40 MG tablet TAKE 1 TABLET BY MOUTH EVERYDAY AT BEDTIME 90 tablet 2   Probiotic Product (PROBIOTIC COLON SUPPORT) CAPS Take 1 capsule by mouth daily.     promethazine (PHENERGAN) 12.5 MG tablet Take 1 tablet (12.5 mg total) by mouth every 6 (six) hours as needed for nausea, vomiting or refractory nausea / vomiting. 270 tablet 1   senna-docusate (SENOKOT-S) 8.6-50 MG tablet Take 2 tablets by mouth 3 (three) times daily.     vancomycin (VANCOCIN) 125 MG capsule Take 1 capsule (125 mg total) by mouth in the morning and at bedtime. 60 capsule 11   imipramine (TOFRANIL) 25 MG tablet Take 1 tablet (25 mg total) by mouth in the morning, at noon, and at bedtime. For bladder spasms (Patient not taking: Reported on 04/10/2023) 270 tablet 1   traZODone (DESYREL) 50 MG tablet Take 1 tablet (50 mg total) by mouth at bedtime. For sleep (Patient not taking: Reported on 04/10/2023) 90 tablet 1   No facility-administered medications prior to visit.     Past Medical History:  Diagnosis Date   Acute on chronic respiratory failure with hypoxia (HCC) 06/2018   trach removed 11-16-2018, on vent from jan until may 2020 - uses albuterol prn   Anxiety    Bacteremia due to Pseudomonas 06/2018   Chronic osteomyelitis (HCC)    Chronic pain syndrome    Clostridium difficile colitis 10/30/2019   tx with abx    Depression    DVT (deep venous thrombosis) (HCC) 2020   right brachial post PICC line   History of blood transfusion 06/2018   History of Clostridioides difficile colitis    History of kidney stones    Hypertension    norvasc d/c by pcp on 11/05/19   Multiple traumatic injuries    Penile pain 11/18/2019   Pneumonia 11/2009   2020 x 2   Walker as ambulation aid    Wheelchair bound    electric   Wound discharge    left hip wound with bloody/clear drainage change dressing q day surgilube with gauze,  between legs wound using calcium algenate pad bid     Past Surgical History:  Procedure Laterality Date   APPLICATION OF A-CELL OF BACK N/A 08/06/2018   Procedure: Application Of A-Cell Of Back;  Surgeon: Peggye Form, DO;  [facility]: [facility];  Service: Plastics;  Laterality: N/A;   APPLICATION OF A-CELL OF EXTREMITY Left 08/06/2018   Procedure: Application Of A-Cell Of Extremity;  Surgeon: Peggye Form, DO;  Location: [facility];  Service: Plastics;  Laterality: Left;   APPLICATION OF A-CELL OF EXTREMITY Left 09/18/2019   Procedure: APPLICATION OF A-CELL OF EXTREMITY;  Surgeon: Peggye Form, DO;  Location: [facility];  Service:  Plastics;  Laterality: Left;   APPLICATION OF WOUND VAC  07/12/2018   Procedure: Application Of Wound Vac to the Left Thigh and Scrotum.;  Surgeon: Roby Lofts, MD;  Location: [facility];  Service: Orthopedics;;   APPLICATION OF WOUND VAC  07/10/2018   Procedure: Application Of Wound Vac;  Surgeon: Berna Bue, MD;  Location: [facility] OR;  Service: General;;   BIOPSY  11/02/2022   Procedure: BIOPSY;  Surgeon: Lemar Lofty., MD;  Location: [facility];  Service: Gastroenterology;;   COLON SURGERY  2020   colostomy   COLOSTOMY N/A 07/23/2018   Procedure: COLOSTOMY;  Surgeon: Violeta Gelinas, MD;  Location: [facility] OR;  Service: General;  Laterality: N/A;   CYSTOSCOPY W/ URETERAL STENT PLACEMENT N/A 07/15/2018   Procedure: RETROGRADE URETHROGRAM;  Surgeon: Marcine Matar, MD;  Location: [facility] OR;  Service: Urology;  Laterality: N/A;   CYSTOSCOPY WITH LITHOLAPAXY N/A 05/06/2019   Procedure: CYSTOSCOPY BASKET BLADDER STONE EXTRACTION;  Surgeon: Malen Gauze, MD;  Location: [facility];  Service: Urology;  Laterality: N/A;  30 MINS   CYSTOSTOMY N/A 05/06/2019   Procedure: REPLACEMENT OF SUPRAPUBIC CATHETER;  Surgeon: Malen Gauze, MD;  Location: [facility];  Service: Urology;  Laterality: N/A;   DEBRIDEMENT AND CLOSURE  WOUND Left 03/04/2019   Procedure: Excision of hip wound with placement of Acell;  Surgeon: Peggye Form, DO;  [facility]: [facility];  Service: Plastics;  Laterality: Left;   ESOPHAGOGASTRODUODENOSCOPY N/A 08/14/2018   Procedure: ESOPHAGOGASTRODUODENOSCOPY (EGD);  Surgeon: Violeta Gelinas, MD;  Location: [facility];  Service: General;  Laterality: N/A;  bedside   ESOPHAGOGASTRODUODENOSCOPY (EGD) WITH PROPOFOL N/A 11/02/2022   Procedure: ESOPHAGOGASTRODUODENOSCOPY (EGD) WITH PROPOFOL;  Surgeon: Meridee Score Netty Starring., MD;  Location: [facility];  Service: Gastroenterology;  Laterality: N/A;   FACIAL RECONSTRUCTION SURGERY     X 2--once as a teenager and second time in his 60's   HARDWARE REMOVAL Left 03/04/2019   Procedure: Left Hip Hardware Removal;  Surgeon: Roby Lofts, MD;  Location: [facility];  Service: Orthopedics;  Laterality: Left;   HEMOSTASIS CLIP PLACEMENT  11/02/2022   Procedure: HEMOSTASIS CLIP PLACEMENT;  Surgeon: Lemar Lofty., MD;  Location: [facility];  Service: Gastroenterology;;   HIP PINNING,CANNULATED Left 07/12/2018   Procedure: CANNULATED HIP PINNING;  Surgeon: Roby Lofts, MD;  [facility]: [facility];  Service: Orthopedics;  Laterality: Left;   HIP SURGERY     HOLMIUM LASER APPLICATION Right 07/15/2019   Procedure: HOLMIUM LASER APPLICATION;  Surgeon: Malen Gauze, MD;  Location: [facility];  Service: Urology;  Laterality: Right;   HOT HEMOSTASIS N/A 11/02/2022   Procedure: HOT HEMOSTASIS (ARGON PLASMA COAGULATION/BICAP);  Surgeon: Lemar Lofty., MD;  Location: [facility];  Service: Gastroenterology;  Laterality: N/A;   I & D EXTREMITY Left 07/25/2018   Procedure: Debridement of buttock, scrotum and left leg, placement of acell and vac;  Surgeon: Peggye Form, DO;  Location: [facility];  Service: Plastics;  Laterality: Left;   I & D EXTREMITY N/A 08/06/2018   Procedure: Debridement of buttock, scrotum and left leg;  Surgeon:  Peggye Form, DO;  Location: [facility];  Service: Plastics;  Laterality: N/A;   I & D EXTREMITY N/A 08/13/2018   Procedure: Debridement of buttock, scrotum and left leg, placement of acell and vac;  Surgeon: Peggye Form, DO;  Location: [facility];  Service: Plastics;  Laterality: N/A;  90 min, please   INCISION  AND DRAINAGE HIP Left 09/18/2019   Procedure: IRRIGATION AND DEBRIDEMENT HIP/ PELVIS WITH WOUND VAC PLACEMENT;  Surgeon: Roby Lofts, MD;  Location: [facility];  Service: Orthopedics;  Laterality: Left;   INCISION AND DRAINAGE OF WOUND N/A 07/18/2018   Procedure: Debridement of left leg, buttocks and scrotal wound with placement of acell and Flexiseal;  Surgeon: Peggye Form, DO;  Location: [facility];  Service: Plastics;  Laterality: N/A;   INCISION AND DRAINAGE OF WOUND Left 08/29/2018   Procedure: Debridement of buttock, scrotum and left leg, placement of acell and vac;  Surgeon: Peggye Form, DO;  Location: [facility];  Service: Plastics;  Laterality: Left;  75 min, please   INCISION AND DRAINAGE OF WOUND Bilateral 10/23/2018   Procedure: DEBRIDEMENT OF BUTTOCK,SCROTUM, AND LEG WOUNDS WITH PLACEMENT OF ACELL- BILATERAL 90 MIN;  Surgeon: Peggye Form, DO;  Location: [facility];  Service: Plastics;  Laterality: Bilateral;   IR ANGIOGRAM PELVIS SELECTIVE OR SUPRASELECTIVE  07/10/2018   IR ANGIOGRAM PELVIS SELECTIVE OR SUPRASELECTIVE  07/10/2018   IR ANGIOGRAM SELECTIVE EACH ADDITIONAL VESSEL  07/10/2018   IR EMBO ART  VEN HEMORR LYMPH EXTRAV  INC GUIDE ROADMAPPING  07/10/2018   IR GASTROSTOMY TUBE MOD SED  01/21/2021   IR NEPHROSTOMY PLACEMENT LEFT  04/05/2019   IR NEPHROSTOMY PLACEMENT RIGHT  05/31/2019   IR US GUIDE BX ASP/DRAIN  07/10/2018   IR US GUIDE VASC ACCESS RIGHT  07/10/2018   IR VENO/EXT/UNI LEFT  07/10/2018   IRRIGATION AND DEBRIDEMENT OF WOUND WITH SPLIT THICKNESS SKIN GRAFT Left 09/19/2018   Procedure: Debridement of gluteal wound with placement of acell to left leg.;   Surgeon: Peggye Form, DO;  Location: [facility];  Service: Plastics;  Laterality: Left;  2.5 hours, please   LAPAROTOMY N/A 07/12/2018   Procedure: EXPLORATORY LAPAROTOMY;  Surgeon: Violeta Gelinas, MD;  Location: [facility] OR;  Service: General;  Laterality: N/A;   LAPAROTOMY N/A 07/15/2018   Procedure: WOUND EXPLORATION; CLOSURE OF ABDOMEN;  Surgeon: Violeta Gelinas, MD;  Location: [facility] OR;  Service: General;  Laterality: N/A;   LAPAROTOMY  07/10/2018   Procedure: Exploratory Laparotomy;  Surgeon: Berna Bue, MD;  Location: [facility] OR;  Service: General;;   MASS EXCISION Left 09/18/2019   Procedure: EXCISION UPPER LEFT INNER THIGH WOUND;  Surgeon: Peggye Form, DO;  Location[facility];  Service: Plastics;  Laterality: Left;   NEPHROLITHOTOMY Right 07/15/2019   Procedure: NEPHROLITHOTOMY PERCUTANEOUS;  Surgeon: Malen Gauze, MD;  Location: [facility];  Service: Urology;  Laterality: Right;  90 MINS   PEG PLACEMENT N/A 08/14/2018   Procedure: PERCUTANEOUS ENDOSCOPIC GASTROSTOMY (PEG) PLACEMENT;  Surgeon: Violeta Gelinas, MD;  Location: [facility];  Service: General;  Laterality: N/A;   PERCUTANEOUS TRACHEOSTOMY N/A 08/02/2018   Procedure: PERCUTANEOUS TRACHEOSTOMY;  Surgeon: Violeta Gelinas, MD;  Location: [facility] OR;  Service: General;  Laterality: N/A;   RADIOLOGY WITH ANESTHESIA N/A 07/10/2018   Procedure: IR WITH ANESTHESIA;  Surgeon: Simonne Come, MD;  Location: [facility] OR;  Service: Radiology;  Laterality: N/A;   RADIOLOGY WITH ANESTHESIA Right 07/10/2018   Procedure: Ir With Anesthesia;  Surgeon: Simonne Come, MD;  Location: [facility] OR;  Service: Radiology;  Laterality: Right;   SAVORY DILATION N/A 11/02/2022   Procedure: SAVORY DILATION;  Surgeon: Meridee Score Netty Starring., MD;  Location: [facility];  Service: Gastroenterology;  Laterality: N/A;   SCROTAL EXPLORATION N/A 07/15/2018   Procedure: SCROTUM DEBRIDEMENT;  Surgeon: Marcine Matar, MD;  Location:  [facility];  Service: Urology;   Laterality: N/A;   SHOULDER SURGERY     SKIN SPLIT GRAFT Right 09/19/2018   Procedure: Skin Graft Split Thickness;  Surgeon: Peggye Form, DO;  Location: [facility];  Service: Plastics;  Laterality: Right;   SKIN SPLIT GRAFT N/A 10/03/2018   Procedure: Split thickness skin graft to gluteal area with acell placement;  Surgeon: Peggye Form, DO;  Location: [facility] OR;  Service: Plastics;  Laterality: N/A;  3 hours, please   VACUUM ASSISTED CLOSURE CHANGE N/A 07/12/2018   Procedure: ABDOMINAL VACUUM ASSISTED CLOSURE CHANGE and abdominal washout;  Surgeon: Violeta Gelinas, MD;  Location: [facility] OR;  Service: General;  Laterality: N/A;   WOUND DEBRIDEMENT Left 07/23/2018   Procedure: DEBRIDEMENT LEFT BUTTOCK  WOUND;  Surgeon: Violeta Gelinas, MD;  Location: [facility] OR;  Service: General;  Laterality: Left;   WOUND EXPLORATION Left 07/10/2018   Procedure: WOUND EXPLORATION LEFT GROIN;  Surgeon: Berna Bue, MD;  Location: [facility] OR;  Service: General;  Laterality: Left;       Review of Systems  Constitutional:  Negative for chills, diaphoresis, fatigue and fever.  Respiratory:  Negative for cough, chest tightness, shortness of breath and wheezing.   Cardiovascular:  Negative for chest pain.  Gastrointestinal:  Negative for abdominal pain, diarrhea, nausea and vomiting.  Psychiatric/Behavioral:  Positive for hallucinations and sleep disturbance. Negative for confusion, decreased concentration, dysphoric mood, self-injury and suicidal ideas. The patient is nervous/anxious.       Objective:    BP (!) 143/89   Pulse 98   Temp 98.4 F (36.9 C) (Temporal)   SpO2 96%  Nursing note and vital signs reviewed.  Physical Exam Constitutional:      General: He is not in acute distress.    Appearance: He is well-developed.  Cardiovascular:     Rate and Rhythm: Normal rate and regular rhythm.     Heart sounds: Normal heart sounds.  Pulmonary:     Effort: Pulmonary effort is normal.     Breath sounds:  Normal breath sounds.  Skin:    General: Skin is warm and dry.  Neurological:     Mental Status: He is alert and oriented to person, place, and time.         02/06/2023   10:59 AM 12/26/2022   11:01 AM 11/14/2022   11:14 AM 11/14/2022   11:08 AM 11/01/2022   10:45 AM  Depression screen PHQ 2/9  Decreased Interest 1 1 1 1  0  Down, Depressed, Hopeless 1 1 1 1  0  PHQ - 2 Score 2 2 2 2  0  Altered sleeping     0  Tired, decreased energy     0  Change in appetite     0  Feeling bad or failure about yourself      0  Trouble concentrating     0  Moving slowly or fidgety/restless     0  Suicidal thoughts     0  PHQ-9 Score     0  Difficult doing work/chores     Not difficult at all       Assessment & Plan:    Patient Active Problem List   Diagnosis Date Noted   At risk for Clostridium difficile infection 04/10/2023   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   At risk for complication of anesthesia 10/29/2022   Therapeutic drug monitoring 10/27/2022   Bladder spasms 10/03/2022   Leg edema 09/30/2022   Bilateral inguinal hernia without  obstruction or gangrene 07/23/2022   Encounter for care related to feeding tube 07/23/2022   Irritant contact dermatitis associated with fecal stoma 07/23/2022   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   Memory deficits 01/12/2022   Hypnagogic jerks 09/22/2021   Neuromuscular weakness (HCC) 06/24/2021   Depression, major, single episode, moderate (HCC) 06/24/2021   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   Chronic osteomyelitis of hip (HCC) 11/22/2020   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/22/2020   Dysphagia 07/02/2020   Therapeutic opioid induced constipation 04/11/2020   Status post peripherally inserted central catheter (PICC) central line placement 03/17/2020   Skin-picking disorder 03/12/2020   Recurrent pain of right knee 11/08/2019   Essential hypertension 11/05/2019   Myofascial pain dysfunction syndrome 08/05/2019   Complicated urinary  tract infection 05/30/2019   Wound infection, posttraumatic 02/28/2019   Hydronephrosis concurrent with and due to calculi of kidney and ureter 02/28/2019   GERD (gastroesophageal reflux disease) 02/28/2019   Suprapubic catheter (HCC) 02/28/2019   Anxiety 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 01/29/2019   Chronic pain due to trauma 01/29/2019   Neuropathic pain 01/29/2019   Colostomy status (HCC) 01/14/2019   Insomnia 01/14/2019   Current use of proton pump inhibitor 01/14/2019   Wound of buttock 12/28/2018   Anxiety and depression 12/17/2018   Debility 11/23/2018   Chronic pain syndrome    Multiple traumatic injuries    Pressure injury of skin 08/13/2018   Urethral injury 07/13/2018     Problem List Items Addressed This Visit       Musculoskeletal and Integument   Chronic osteomyelitis of hip (HCC) - Primary    Christopher [name] continues to have good suppression of multidrug-resistant Pseudomonas infection with chronic osteomyelitis of the hip and pelvis.  Tolerating medication with no adverse side effects and is without diarrhea or muscle pains.  Continue current dose of ciprofloxacin and oral vancomycin.        Other   Anxiety and depression    Christopher [name] has been having increased levels of anxiety of unclear origin and continues to work with psychiatry.  He is having positive symptoms and has auditory hallucinations at times.  Appears slightly anxious today.  Continue management per psychiatry and psychology.      At risk for Clostridium difficile infection    Christopher [name] remains at risk for C. difficile infection with long-term/chronic use of ciprofloxacin.  Continue current dose of oral vancomycin for prophylaxis.      Other Visit Diagnoses     Pseudomonas infection       Chronic osteomyelitis of pelvis, left (HCC)            I am having Christopher [name] maintain his multivitamin with minerals, melatonin, Blood Pressure Cuff, oxybutynin,  dextromethorphan-guaiFENesin, Probiotic Colon Support, senna-docusate, famotidine, LORazepam, OLANZapine, Breztri Aerosphere, hydrochlorothiazide, Linzess, Gemtesa, ciprofloxacin, vancomycin, gabapentin, pantoprazole, PARoxetine, imipramine, naloxone, promethazine, traZODone, fentaNYL, morphine, albuterol, and furosemide.  Follow-up: Return in about 6 months (around 10/09/2023). or sooner if needed.   Marcos Eke, MSN, FNP-C Nurse Practitioner [facility] Main number: [phone]

## 2023-04-14 ENCOUNTER — Ambulatory Visit (INDEPENDENT_AMBULATORY_CARE_PROVIDER_SITE_OTHER): Payer: Self-pay | Admitting: Internal Medicine

## 2023-04-14 ENCOUNTER — Encounter: Payer: Self-pay | Admitting: Internal Medicine

## 2023-04-14 VITALS — BP 133/88 | HR 106 | Temp 97.9°F | Resp 20

## 2023-04-14 DIAGNOSIS — J4489 Other specified chronic obstructive pulmonary disease: Secondary | ICD-10-CM

## 2023-04-14 DIAGNOSIS — J9611 Chronic respiratory failure with hypoxia: Secondary | ICD-10-CM

## 2023-04-14 DIAGNOSIS — Z23 Encounter for immunization: Secondary | ICD-10-CM

## 2023-04-14 DIAGNOSIS — J439 Emphysema, unspecified: Secondary | ICD-10-CM

## 2023-04-14 MED ORDER — AEROCHAMBER MV MISC: 0 refills | Status: AC
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
## 2023-04-14 NOTE — Addendum Note (Signed)
Addended by: Lanna Poche on: 04/14/2023 11:27 AM   Modules accepted: Orders

## 2023-04-14 NOTE — Patient Instructions (Signed)
It was a pleasure to see you today!  Please schedule follow up scheduled with myself in 6 months.  If my schedule is not open yet, we will contact you with a reminder closer to that time. Please call [phone] if you haven't heard from [place] a month before, and always call us sooner if issues or concerns arise. You can also send [place] a message through MyChart, but but aware that this is not to be used for urgent issues and it may take up to 5-7 days to receive a reply. Please be aware that you will likely be able to view your results before I have a chance to respond to them. Please give [place] 5 business days to respond to any non-urgent results.    Continue Breztri 2 puffs in the morning, two puffs at night.  Add a spacer to this.  Continue albuterol inhaler as needed.   No evidence of sleep apnea on home sleep test.   Call us sooner if any issues or concerns about your breathing.

## 2023-04-17 ENCOUNTER — Telehealth: Payer: Self-pay | Admitting: Family Medicine

## 2023-04-17 ENCOUNTER — Ambulatory Visit: Payer: PRIVATE HEALTH INSURANCE | Admitting: Family

## 2023-04-17 NOTE — Telephone Encounter (Signed)
Received faxed document Home Health Certificate (Order ID [id number]), to be filled out by provider. Patient requested to send it back via Fax  Document is located in providers tray at front office.Please advise

## 2023-04-19 DIAGNOSIS — R1310 Dysphagia, unspecified: Secondary | ICD-10-CM

## 2023-04-19 DIAGNOSIS — I700 Atherosclerosis of aorta: Secondary | ICD-10-CM

## 2023-04-19 DIAGNOSIS — Z933 Colostomy status: Secondary | ICD-10-CM

## 2023-04-19 DIAGNOSIS — M86652 Other chronic osteomyelitis, left thigh: Secondary | ICD-10-CM

## 2023-04-19 DIAGNOSIS — G894 Chronic pain syndrome: Secondary | ICD-10-CM

## 2023-04-19 DIAGNOSIS — E43 Unspecified severe protein-calorie malnutrition: Secondary | ICD-10-CM | POA: Diagnosis not present

## 2023-04-19 DIAGNOSIS — G8220 Paraplegia, unspecified: Secondary | ICD-10-CM | POA: Diagnosis not present

## 2023-04-19 DIAGNOSIS — I10 Essential (primary) hypertension: Secondary | ICD-10-CM

## 2023-04-19 DIAGNOSIS — G4700 Insomnia, unspecified: Secondary | ICD-10-CM

## 2023-04-19 DIAGNOSIS — F1120 Opioid dependence, uncomplicated: Secondary | ICD-10-CM

## 2023-04-19 DIAGNOSIS — Z435 Encounter for attention to cystostomy: Secondary | ICD-10-CM

## 2023-04-19 DIAGNOSIS — J9611 Chronic respiratory failure with hypoxia: Secondary | ICD-10-CM

## 2023-04-19 NOTE — Telephone Encounter (Signed)
Placed in PCP office for review

## 2023-04-21 NOTE — Telephone Encounter (Signed)
Faxed on 04/20/2023 to [phone] Form placed to be scan in patient chart

## 2023-04-26 ENCOUNTER — Other Ambulatory Visit: Payer: Self-pay | Admitting: Physical Medicine and Rehabilitation

## 2023-05-01 ENCOUNTER — Encounter: Payer: Self-pay | Admitting: Physical Medicine and Rehabilitation

## 2023-05-01 ENCOUNTER — Encounter
Payer: No Typology Code available for payment source | Attending: Physical Medicine and Rehabilitation | Admitting: Physical Medicine and Rehabilitation

## 2023-05-01 VITALS — BP 128/87 | HR 92 | Ht 75.0 in

## 2023-05-01 DIAGNOSIS — M8668 Other chronic osteomyelitis, other site: Secondary | ICD-10-CM | POA: Diagnosis present

## 2023-05-01 DIAGNOSIS — Z5181 Encounter for therapeutic drug level monitoring: Secondary | ICD-10-CM

## 2023-05-01 DIAGNOSIS — G8921 Chronic pain due to trauma: Secondary | ICD-10-CM | POA: Diagnosis present

## 2023-05-01 DIAGNOSIS — Z79891 Long term (current) use of opiate analgesic: Secondary | ICD-10-CM | POA: Diagnosis present

## 2023-05-01 MED ORDER — FENTANYL 75 MCG/HR TD PT72: 1.0000 | MEDICATED_PATCH | TRANSDERMAL | 0 refills | Status: DC

## 2023-05-01 MED ORDER — MORPHINE SULFATE 15 MG PO TABS: 15.0000 mg | ORAL_TABLET | ORAL | 0 refills | Status: DC | PRN

## 2023-05-01 NOTE — Progress Notes (Addendum)
Subjective:    Patient ID: Christopher [name], male    DOB: 1965-10-13, 57 y.o.   MRN: [id number]  HPI  Pt is a 57 yr old male with hx of Multitrauma- causing L femoral neck fx, degloving of L hip to going/scrotum, bladder neck trauma- got SPC, developed compartment syndrome -s/p surgery for that; also diverting colostomy, skin grafts, and s/p trach and PEG- PEG is out. 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 S/P screw removal and on IV ABX for L hip osteomyelitis. 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  Has R severe ulnar neuropathy as well. O2 3L full time.   Here for f/u on multitrauma and chronic L hip osteomyelitis with associated chronic pain.     Things about the same.  Woke up hurting bad this AM- due to how he slept-  L low back and L hip.   Needs ? Full length bedrails- the sides- to keep him in bed- because he slides somewhat out of ned and feet on floor when hurting. When on back in bed, legs always to Right side- no matter what.     Is doing PT- been 2 days hasn't been able to participate- woman will try to redo recertifcation for PT.   Standing better with PT per pt. Depends day to day- leaning far to R today due to pain  Trying to take pressure off L hip- and cold outside- hurts more.   Hasn't been using weights- wife asked about using- but hasn't tried them yet.    Doesn't have sleep apnea- so didn't change anything- Pulmonary.   Waiting since May to get w/c repairs for foot rests- 11-1 pm to come out.  Finally coming.      Pain Inventory Average Pain 7 Pain Right Now 7 My pain is intermittent, sharp, burning, dull, stabbing, tingling, and aching  LOCATION OF PAIN  back, buttocks, groin, hip, leg  BOWEL Oral laxative use Yes  Type of laxative senna  History of colostomy Yes   BLADDER Suprapubic In and out cath, frequency  PRN Able to self cath No     Mobility use a walker ability to climb steps?  no do you drive?  no use a wheelchair needs help with transfers Do you have any goals in this area?  yes  Function I need assistance with the following:  dressing, bathing, toileting, meal prep, household duties, and shopping Do you have any goals in this area?  yes  Neuro/Psych weakness numbness tremor tingling trouble walking spasms dizziness confusion depression anxiety  Prior Studies Any changes since last visit?  no  Physicians involved in your care Any changes since last visit?  no   Family History  Problem Relation Age of Onset   Breast cancer Mother        with mets to the bones   Social History   Socioeconomic History   Marital status: Married    Spouse name: Not on file   Number of children: Not on file   Years  of education: Not on file   Highest education level: Not on file  Occupational History   Occupation: Disable  Tobacco Use   Smoking status: Former    Current packs/day: 0.00    Average packs/day: 1 pack/day for 20.0 years (20.0 ttl pk-yrs)    Types: Cigarettes    Start date: 07/10/1998    Quit date: 07/10/2018    Years since quitting: 4.8   Smokeless tobacco: Never  Vaping Use   Vaping status: Never Used  Substance and Sexual Activity   Alcohol use: Never   Drug use: Yes    Types: Oxycodone, Fentanyl    Comment: Fentanyl patch/oxycodone since 06/2018   Sexual activity: Yes  Other Topics Concern   Not on file  Social History Narrative   ** Merged History Encounter **       Social Determinants of Health   Financial Resource Strain:  Low Risk  (03/01/2019)   Overall Financial Resource Strain (CARDIA)    Difficulty of Paying Living Expenses: Not very hard  Food Insecurity: Food Insecurity Present (03/01/2019)   Hunger Vital Sign    Worried About Running Out of Food in the Last Year: Sometimes true    Ran Out of Food in the Last Year: Sometimes true  Transportation Needs: No Transportation Needs (03/01/2019)   PRAPARE - Administrator, Civil Service (Medical): No    Lack of Transportation (Non-Medical): No  Physical Activity: Inactive (03/01/2019)   Exercise Vital Sign    Days of Exercise per Week: 0 days    Minutes of Exercise per Session: 0 min  Stress: Stress Concern Present (03/01/2019)   [employer] of Occupational Health - Occupational Stress Questionnaire    Feeling of Stress : Rather much  Social Connections: Not on file   Past Surgical History:  Procedure Laterality Date   APPLICATION OF A-CELL OF BACK N/A 08/06/2018   Procedure: Application Of A-Cell Of Back;  Surgeon: Peggye Form, DO;  Location: [facility] OR;  Service: Plastics;  Laterality: N/A;   APPLICATION OF A-CELL OF EXTREMITY Left 08/06/2018   Procedure: Application Of A-Cell Of Extremity;  Surgeon: Peggye Form, DO;  Location: [facility];  Service: Plastics;  Laterality: Left;   APPLICATION OF A-CELL OF EXTREMITY Left 09/18/2019   Procedure: APPLICATION OF A-CELL OF EXTREMITY;  Surgeon: Peggye Form, DO;  Location: [facility];  Service: Plastics;  Laterality: Left;   APPLICATION OF WOUND VAC  07/12/2018   Procedure: Application Of Wound Vac to the Left Thigh and Scrotum.;  Surgeon: Roby Lofts, MD;  Location: [facility] OR;  Service: Orthopedics;;   APPLICATION OF WOUND VAC  07/10/2018   Procedure: Application Of Wound Vac;  Surgeon: Berna Bue, MD;  Location: [facility] OR;  Service: General;;   BIOPSY  11/02/2022   Procedure: BIOPSY;  Surgeon: Lemar Lofty., MD;  Location: [facility];  Service: Gastroenterology;;   COLON  SURGERY  2020   colostomy   COLOSTOMY N/A 07/23/2018   Procedure: COLOSTOMY;  Surgeon: Violeta Gelinas, MD;  Location: [facility] OR;  Service: General;  Laterality: N/A;   CYSTOSCOPY W/ URETERAL STENT PLACEMENT N/A 07/15/2018   Procedure: RETROGRADE URETHROGRAM;  Surgeon: Marcine Matar, MD;  Location: [facility] OR;  Service: Urology;  Laterality: N/A;   CYSTOSCOPY WITH LITHOLAPAXY N/A 05/06/2019   Procedure: CYSTOSCOPY BASKET BLADDER STONE EXTRACTION;  Surgeon: Malen Gauze, MD;  Location: [facility];  Service: Urology;  Laterality: N/A;  30 MINS  CYSTOSTOMY N/A 05/06/2019   Procedure: REPLACEMENT OF SUPRAPUBIC CATHETER;  Surgeon: Malen Gauze, MD;  Location: [facility];  Service: Urology;  Laterality: N/A;   DEBRIDEMENT AND CLOSURE WOUND Left 03/04/2019   Procedure: Excision of hip wound with placement of Acell;  Surgeon: Peggye Form, DO;  [facility]: [facility];  Service: Plastics;  Laterality: Left;   ESOPHAGOGASTRODUODENOSCOPY N/A 08/14/2018   Procedure: ESOPHAGOGASTRODUODENOSCOPY (EGD);  Surgeon: Violeta Gelinas, MD;  Location: [facility] ENDOSCOPY;  Service: General;  Laterality: N/A;  bedside   ESOPHAGOGASTRODUODENOSCOPY (EGD) WITH PROPOFOL N/A 11/02/2022   Procedure: ESOPHAGOGASTRODUODENOSCOPY (EGD) WITH PROPOFOL;  Surgeon: Meridee Score Netty Starring., MD;  Location: [facility] ENDOSCOPY;  Service: Gastroenterology;  Laterality: N/A;   FACIAL RECONSTRUCTION SURGERY     X 2--once as a teenager and second time in his 50's   HARDWARE REMOVAL Left 03/04/2019   Procedure: Left Hip Hardware Removal;  Surgeon: Roby Lofts, MD;  Location: [facility];  Service: Orthopedics;  Laterality: Left;   HEMOSTASIS CLIP PLACEMENT  11/02/2022   Procedure: HEMOSTASIS CLIP PLACEMENT;  Surgeon: Lemar Lofty., MD;  Location: [facility];  Service: Gastroenterology;;   HIP PINNING,CANNULATED Left 07/12/2018   Procedure: CANNULATED HIP PINNING;  Surgeon: Roby Lofts, MD;  [facility];  Service: Orthopedics;  Laterality: Left;   HIP SURGERY     HOLMIUM LASER APPLICATION Right 07/15/2019   Procedure: HOLMIUM LASER APPLICATION;  Surgeon: Malen Gauze, MD;  Location: [facility];  Service: Urology;  Laterality: Right;   HOT HEMOSTASIS N/A 11/02/2022   Procedure: HOT HEMOSTASIS (ARGON PLASMA COAGULATION/BICAP);  Surgeon: Lemar Lofty., MD;  Location: [facility];  Service: Gastroenterology;  Laterality: N/A;   I & D EXTREMITY Left 07/25/2018   Procedure: Debridement of buttock, scrotum and left leg, placement of acell and vac;  Surgeon: Peggye Form, DO;  [facility];  Service: Plastics;  Laterality: Left;   I & D EXTREMITY N/A 08/06/2018   Procedure: Debridement of buttock, scrotum and left leg;  Surgeon: Peggye Form, DO;  [facility]: [facility];  Service: Plastics;  Laterality: N/A;   I & D EXTREMITY N/A 08/13/2018   Procedure: Debridement of buttock, scrotum and left leg, placement of acell and vac;  Surgeon: Peggye Form, DO;  [facility]: [facility];  Service: Plastics;  Laterality: N/A;  90 min, please   INCISION AND DRAINAGE HIP Left 09/18/2019   Procedure: IRRIGATION AND DEBRIDEMENT HIP/ PELVIS WITH WOUND VAC PLACEMENT;  Surgeon: Roby Lofts, MD;  Location: [facility];  Service: Orthopedics;  Laterality: Left;   INCISION AND DRAINAGE OF WOUND N/A 07/18/2018   Procedure: Debridement of left leg, buttocks and scrotal wound with placement of acell and Flexiseal;  Surgeon: Peggye Form, DO;  [facility]: [facility];  Service: Plastics;  Laterality: N/A;   INCISION AND DRAINAGE OF WOUND Left 08/29/2018   Procedure: Debridement of buttock, scrotum and left leg, placement of acell and vac;  Surgeon: Peggye Form, DO;  [facility]: [facility];  Service: Plastics;  Laterality: Left;  75 min, please   INCISION AND DRAINAGE OF WOUND Bilateral 10/23/2018   Procedure: DEBRIDEMENT OF BUTTOCK,SCROTUM, AND LEG WOUNDS WITH PLACEMENT OF ACELL- BILATERAL 90  MIN;  Surgeon: Peggye Form, DO;  Location: [facility];  Service: Plastics;  Laterality: Bilateral;   IR ANGIOGRAM PELVIS SELECTIVE OR SUPRASELECTIVE  07/10/2018   IR ANGIOGRAM PELVIS SELECTIVE OR SUPRASELECTIVE  07/10/2018   IR ANGIOGRAM SELECTIVE EACH ADDITIONAL VESSEL  07/10/2018   IR EMBO ART  VEN HEMORR LYMPH EXTRAV  INC GUIDE ROADMAPPING  07/10/2018   IR GASTROSTOMY TUBE MOD SED  01/21/2021   IR NEPHROSTOMY PLACEMENT LEFT  04/05/2019   IR NEPHROSTOMY PLACEMENT RIGHT  05/31/2019   IR US GUIDE BX ASP/DRAIN  07/10/2018   IR US GUIDE VASC ACCESS RIGHT  07/10/2018   IR VENO/EXT/UNI LEFT  07/10/2018   IRRIGATION AND DEBRIDEMENT OF WOUND WITH SPLIT THICKNESS SKIN GRAFT Left 09/19/2018   Procedure: Debridement of gluteal wound with placement of acell to left leg.;  Surgeon: Peggye Form, DO;  Location: [facility];  Service: Plastics;  Laterality: Left;  2.5 hours, please   LAPAROTOMY N/A 07/12/2018   Procedure: EXPLORATORY LAPAROTOMY;  Surgeon: Violeta Gelinas, MD;  Location: [facility] OR;  Service: General;  Laterality: N/A;   LAPAROTOMY N/A 07/15/2018   Procedure: WOUND EXPLORATION; CLOSURE OF ABDOMEN;  Surgeon: Violeta Gelinas, MD;  Location: [facility] OR;  Service: General;  Laterality: N/A;   LAPAROTOMY  07/10/2018   Procedure: Exploratory Laparotomy;  Surgeon: Berna Bue, MD;  Location: [facility] OR;  Service: General;;   MASS EXCISION Left 09/18/2019   Procedure: EXCISION UPPER LEFT INNER THIGH WOUND;  Surgeon: Peggye Form, DO;  [facility];  Service: Plastics;  Laterality: Left;   NEPHROLITHOTOMY Right 07/15/2019   Procedure: NEPHROLITHOTOMY PERCUTANEOUS;  Surgeon: Malen Gauze, MD;  Location: [facility];  Service: Urology;  Laterality: Right;  90 MINS   PEG PLACEMENT N/A 08/14/2018   Procedure: PERCUTANEOUS ENDOSCOPIC GASTROSTOMY (PEG) PLACEMENT;  Surgeon: Violeta Gelinas, MD;  Location: [facility] ENDOSCOPY;  Service: General;  Laterality: N/A;   PERCUTANEOUS TRACHEOSTOMY N/A  08/02/2018   Procedure: PERCUTANEOUS TRACHEOSTOMY;  Surgeon: Violeta Gelinas, MD;  Location: [facility] OR;  Service: General;  Laterality: N/A;   RADIOLOGY WITH ANESTHESIA N/A 07/10/2018   Procedure: IR WITH ANESTHESIA;  Surgeon: Simonne Come, MD;  Location: [facility] OR;  Service: Radiology;  Laterality: N/A;   RADIOLOGY WITH ANESTHESIA Right 07/10/2018   Procedure: Ir With Anesthesia;  Surgeon: Simonne Come, MD;  Location: [facility] OR;  Service: Radiology;  Laterality: Right;   SAVORY DILATION N/A 11/02/2022   Procedure: SAVORY DILATION;  Surgeon: Meridee Score Netty Starring., MD;  Location: [facility];  Service: Gastroenterology;  Laterality: N/A;   SCROTAL EXPLORATION N/A 07/15/2018   Procedure: SCROTUM DEBRIDEMENT;  Surgeon: Marcine Matar, MD;  Location: [facility] OR;  Service: Urology;  Laterality: N/A;   SHOULDER SURGERY     SKIN SPLIT GRAFT Right 09/19/2018   Procedure: Skin Graft Split Thickness;  Surgeon: Peggye Form, DO;  Location: [facility];  Service: Plastics;  Laterality: Right;   SKIN SPLIT GRAFT N/A 10/03/2018   Procedure: Split thickness skin graft to gluteal area with acell placement;  Surgeon: Peggye Form, DO;  Location: [facility] OR;  Service: Plastics;  Laterality: N/A;  3 hours, please   VACUUM ASSISTED CLOSURE CHANGE N/A 07/12/2018   Procedure: ABDOMINAL VACUUM ASSISTED CLOSURE CHANGE and abdominal washout;  Surgeon: Violeta Gelinas, MD;  Location: [facility] OR;  Service: General;  Laterality: N/A;   WOUND DEBRIDEMENT Left 07/23/2018   Procedure: DEBRIDEMENT LEFT BUTTOCK  WOUND;  Surgeon: Violeta Gelinas, MD;  Location: [facility] OR;  Service: General;  Laterality: Left;   WOUND EXPLORATION Left 07/10/2018   Procedure: WOUND EXPLORATION LEFT GROIN;  Surgeon: Berna Bue, MD;  Location: [facility] OR;  Service: General;  Laterality: Left;   Past Medical History:  Diagnosis Date   Acute on chronic  respiratory failure with hypoxia (HCC) 06/2018   trach removed 11-16-2018, on vent from jan until may 2020 - uses albuterol  prn   Anxiety    Bacteremia due to Pseudomonas 06/2018   Chronic osteomyelitis (HCC)    Chronic pain syndrome    Clostridium difficile colitis 10/30/2019   tx with abx    Depression    DVT (deep venous thrombosis) (HCC) 2020   right brachial post PICC line   History of blood transfusion 06/2018   History of Clostridioides difficile colitis    History of kidney stones    Hypertension    norvasc d/c by pcp on 11/05/19   Multiple traumatic injuries    Penile pain 11/18/2019   Pneumonia 11/2009   2020 x 2   Walker as ambulation aid    Wheelchair bound    electric   Wound discharge    left hip wound with bloody/clear drainage change dressing q day surgilube with gauze, between legs wound using calcium algenate pad bid   There were no vitals taken for this visit.  Opioid Risk Score:   Fall Risk Score:  `1  Depression screen [facility] 2/9     02/06/2023   10:59 AM 12/26/2022   11:01 AM 11/14/2022   11:14 AM 11/14/2022   11:08 AM 11/01/2022   10:45 AM 09/30/2022   10:51 AM 08/17/2022    9:21 AM  Depression screen PHQ 2/9  Decreased Interest 1 1 1 1  0 2 1  Down, Depressed, Hopeless 1 1 1 1  0 3 1  PHQ - 2 Score 2 2 2 2  0 5 2  Altered sleeping     0 3   Tired, decreased energy     0 3   Change in appetite     0 3   Feeling bad or failure about yourself      0 3   Trouble concentrating     0 3   Moving slowly or fidgety/restless     0 1   Suicidal thoughts     0 0   PHQ-9 Score     0 21   Difficult doing work/chores     Not difficult at all Very difficult     Review of Systems  Neurological:  Positive for dizziness, tremors, weakness and numbness.  Psychiatric/Behavioral:  Positive for confusion.        Anxiety, depression  All other systems reviewed and are negative.      Objective:   Physical Exam  Awake, alert, more interactive; wearing O2 by [employer]; leaning strongly to R side- cannot sit on L hip at all.  NAD Accompanied by wife        Assessment & Plan:   Pt is a 57 yr  old male with hx of Multitrauma- causing L femoral neck fx, degloving of L hip to going/scrotum, bladder neck trauma- got SPC, developed compartment syndrome -s/p surgery for that; also diverting colostomy, skin grafts, and s/p trach and PEG- PEG is out. 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 S/P screw removal and on IV ABX for L hip osteomyelitis. 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   MIGHT have  an incomplete paraplegia- with LLE more affected, however cannot tell on clinical exam if weakness due to SCI vs severe debility.  Has R severe ulnar neuropathy as well. O2 3L full time.   Here for f/u on multitrauma and chronic L hip osteomyelitis with associated chronic pain.     Suggest trying 1 lb weights- 2-3x/week. 5-10 rep- of biceps, triceps and arm lifts at shoulders.   2. Doesn't need Imipramine refilled- last refilled 02/06/23  3.  Con't Paxil- has refills. Will refill at next appt.    4. Takes phenergan daily- con't- at least 1x/day- refilled 04/26/23.    5. Refill Duragesic 75 mcg q2 days #15  and MSIR 15 mg q3 hours- #200.   6.  Linzess comes from PCP- Dr [name].    7. To see PCP next week- and to check K+/electrolytes and renal function.   8. Sees Dr Dawna Part next week- for f/u on psychiatric issues.   9.  Con't gabapentin 300 mg TID- doesn't need refills-   10. Let's see if can get full length bedrails for hospital bed? Wrote Rx for workers comp.    11. Con't PT- think it can help patient to get moving more- standing/walking better.   12. F/U in 6 weeks- double appt- Chronic trauma  13. Did oral drug screen per clinic policy and worke'r scomp policy.    I spent a  total of  25  minutes on total care today- >50% coordination of care- due to d/w pt and wife about bed rails; and therapy and went over meds- refilled meds as appropriate.

## 2023-05-01 NOTE — Patient Instructions (Signed)
Pt is a 57 yr old male with hx of Multitrauma- causing L femoral neck fx, degloving of L hip to going/scrotum, bladder neck trauma- got SPC, developed compartment syndrome -s/p surgery for that; also diverting colostomy, skin grafts, and s/p trach and PEG- PEG is out. 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 S/P screw removal and on IV ABX for L hip osteomyelitis. 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  Has R severe ulnar neuropathy as well. O2 3L full time.   Here for f/u on multitrauma and chronic L hip osteomyelitis with associated chronic pain.     Suggest trying 1 lb weights- 2-3x/week. 5-10 rep- of biceps, triceps and arm lifts at shoulders.   2. Doesn't need Imipramine refilled- last refilled 02/06/23  3.  Con't Paxil- has refills. Will refill at next appt.    4. Takes phenergan daily- con't- at least 1x/day- refilled 04/26/23.    5. Refill Duragesic 75 mcg q2 days #15  and MSIR 15 mg q3 hours- #200.   6.  Linzess comes from PCP- Dr [name].    7. To see PCP next week- and to check K+/electrolytes and renal function.   8. Sees Dr Dawna Part next week- for f/u on psychiatric issues.   9.  Con't gabapentin 300 mg TID- doesn't need refills-   10. Let's see if can get full length bedrails for hospital bed? Wrote Rx for workers comp.    11. Con't PT- think it can help patient to get moving more- standing/walking better.   12. F/U in 6 weeks- double appt- Chronic trauma

## 2023-05-02 ENCOUNTER — Telehealth: Payer: Self-pay

## 2023-05-02 NOTE — Telephone Encounter (Signed)
Pharmacy has requested new scripts to be sent. Please add diagnosis codes. Prior scripts has been cancelled by phone.   Thank you.

## 2023-05-03 MED ORDER — MORPHINE SULFATE 15 MG PO TABS: 15.0000 mg | ORAL_TABLET | ORAL | 0 refills | Status: DC | PRN

## 2023-05-03 MED ORDER — FENTANYL 75 MCG/HR TD PT72: 1.0000 | MEDICATED_PATCH | TRANSDERMAL | 0 refills | Status: DC

## 2023-05-04 ENCOUNTER — Encounter: Payer: Self-pay | Admitting: Family Medicine

## 2023-05-04 ENCOUNTER — Ambulatory Visit (INDEPENDENT_AMBULATORY_CARE_PROVIDER_SITE_OTHER): Payer: PRIVATE HEALTH INSURANCE | Admitting: Family Medicine

## 2023-05-04 VITALS — BP 135/89 | HR 95 | Temp 97.7°F

## 2023-05-04 DIAGNOSIS — F321 Major depressive disorder, single episode, moderate: Secondary | ICD-10-CM | POA: Diagnosis not present

## 2023-05-04 DIAGNOSIS — I10 Essential (primary) hypertension: Secondary | ICD-10-CM | POA: Diagnosis not present

## 2023-05-04 DIAGNOSIS — E8881 Metabolic syndrome: Secondary | ICD-10-CM | POA: Diagnosis not present

## 2023-05-04 LAB — LIPID PANEL
Cholesterol: 175 mg/dL (ref 0–200)
HDL: 39.6 mg/dL (ref >39.00)
LDL Cholesterol: 115 mg/dL — ABNORMAL HIGH (ref 0–99)
NonHDL: 135.29
Total CHOL/HDL Ratio: 4
Triglycerides: 99 mg/dL (ref 0.0–149.0)
VLDL: 19.8 mg/dL (ref 0.0–40.0)

## 2023-05-04 LAB — HEMOGLOBIN A1C: Hgb A1c MFr Bld: 5.5 % (ref 4.6–6.5)

## 2023-05-04 LAB — COMPREHENSIVE METABOLIC PANEL
ALT: 64 U/L — ABNORMAL HIGH (ref 0–53)
AST: 44 U/L — ABNORMAL HIGH (ref 0–37)
Albumin: 3.9 g/dL (ref 3.5–5.2)
Alkaline Phosphatase: 137 U/L — ABNORMAL HIGH (ref 39–117)
BUN: 17 mg/dL (ref 6–23)
CO2: 30 meq/L (ref 19–32)
Calcium: 9.9 mg/dL (ref 8.4–10.5)
Chloride: 97 meq/L (ref 96–112)
Creatinine, Ser: 0.87 mg/dL (ref 0.40–1.50)
GFR: 96.4 mL/min (ref >60.00)
Glucose, Bld: 107 mg/dL — ABNORMAL HIGH (ref 70–99)
Potassium: 3.1 meq/L — ABNORMAL LOW (ref 3.5–5.1)
Sodium: 135 meq/L (ref 135–145)
Total Bilirubin: 0.4 mg/dL (ref 0.2–1.2)
Total Protein: 7.5 g/dL (ref 6.0–8.3)

## 2023-05-04 NOTE — Telephone Encounter (Signed)
Rx sent by Dr. Berline Chough.

## 2023-05-04 NOTE — Assessment & Plan Note (Signed)
Patient on olanzapine per psychiatry which does increase risk for development of metabolic syndrome including dyslipidemia, diabetes, and hypertension.  He does not remember last time he had cholesterol levels checked.  We will check this today.  Also check A1c.

## 2023-05-04 NOTE — Assessment & Plan Note (Signed)
Blood pressure at goal today on HCTZ 12.5 mg daily.  He is tolerating well.  No significant side effects.  Is not having any more issues with leg edema.  We will recheck labs today.  They will continue to monitor periodically at home and let us know if persistently elevated.

## 2023-05-04 NOTE — Patient Instructions (Signed)
It was very nice to see you today!  I am glad that you are doing well. Your blood pressure is at goal.   We will check labs today.  No medication changes.  No follow-ups on file.   Take care, Dr [name]  PLEASE NOTE:  If you had any lab tests, please let us know if you have not heard back within a few days. You may see your results on mychart before we have a chance to review them but we will give you a call once they are reviewed by [place].   If we ordered any referrals today, please let us know if you have not heard from their office within the next week.   If you had any urgent prescriptions sent in today, please check with the pharmacy within an hour of our visit to make sure the prescription was transmitted appropriately.   Please try these tips to maintain a healthy lifestyle:  Eat at least 3 REAL meals and 1-2 snacks per day.  Aim for no more than 5 hours between eating.  If you eat breakfast, please do so within one hour of getting up.   Each meal should contain half fruits/vegetables, one quarter protein, and one quarter carbs (no bigger than a computer mouse)  Cut down on sweet beverages. This includes juice, soda, and sweet tea.   Drink at least 1 glass of water with each meal and aim for at least 8 glasses per day  Exercise at least 150 minutes every week.

## 2023-05-04 NOTE — Assessment & Plan Note (Signed)
Follows with psychiatry.  On Paxil 40 mg daily, olanzapine 15 mg total daily.  His symptoms are well-controlled however medications do increased risk factors for dyslipidemia and diabetes.

## 2023-05-04 NOTE — Progress Notes (Signed)
   Christopher [name] is a 57 y.o. male who presents today for an office visit.  Assessment/Plan:  Chronic Problems Addressed Today: Essential hypertension Blood pressure at goal today on HCTZ 12.5 mg daily.  He is tolerating well.  No significant side effects.  Is not having any more issues with leg edema.  We will recheck labs today.  They will continue to monitor periodically at home and let us know if persistently elevated.  Metabolic syndrome Patient on olanzapine per psychiatry which does increase risk for development of metabolic syndrome including dyslipidemia, diabetes, and hypertension.  He does not remember last time he had cholesterol levels checked.  We will check this today.  Also check A1c.  Depression, major, single episode, moderate (HCC) Follows with psychiatry.  On Paxil 40 mg daily, olanzapine 15 mg total daily.  His symptoms are well-controlled however medications do increased risk factors for dyslipidemia and diabetes.    Subjective:  HPI:  See assessment / plan for status of chronic conditions.  Patient is here today for follow-up.  We last saw him 6 months ago.  At our last visit,  He was overall doing well.  We continued him on HCTZ 12.5 mg daily and Lasix 40 mg daily for his hypertension and leg edema.  He has done well with this.  Since our last visit he has followed with his other specialists including ID, pulmonary, and physical medicine.  He has not had any recent changes to medication regimen.  Feels well today.      Objective:  Physical Exam: BP 135/89   Pulse 95   Temp 97.7 F (36.5 C) (Temporal)   SpO2 96%   Gen: No acute distress, resting comfortably in wheelchair CV: Regular rate and rhythm with no murmurs appreciated Pulm: Normal work of breathing, clear to auscultation bilaterally with no crackles, wheezes, or rhonchi Psych: Normal affect and thought content      Christopher [name] M. [name], MD 05/04/2023 11:09 AM

## 2023-05-05 ENCOUNTER — Encounter: Payer: Self-pay | Admitting: Family Medicine

## 2023-05-05 ENCOUNTER — Other Ambulatory Visit: Payer: Self-pay | Admitting: *Deleted

## 2023-05-05 DIAGNOSIS — E785 Hyperlipidemia, unspecified: Secondary | ICD-10-CM | POA: Insufficient documentation

## 2023-05-05 DIAGNOSIS — E876 Hypokalemia: Secondary | ICD-10-CM

## 2023-05-05 NOTE — Progress Notes (Signed)
His cholesterol is mildly elevated.  He does not need to start meds for this however continue to work on healthy diet and staying active.  We can recheck this again in about a year or so.  His blood sugar is normal.  We can also recheck this in a year.  His potassium is a little low and he did have a slight elevation in his liver numbers.  I would like for him to come back in 1 to 2 weeks to recheck to make sure that this is stable.  Please place future order for CMET.

## 2023-05-07 LAB — DRUG TOX MONITOR 1 W/CONF, ORAL FLD
Alprazolam: NEGATIVE ng/mL (ref <0.50)
Amphetamines: NEGATIVE ng/mL (ref <10)
Barbiturates: NEGATIVE ng/mL (ref <10)
Benzodiazepines: POSITIVE ng/mL — AB (ref <0.50)
Buprenorphine: NEGATIVE ng/mL (ref <0.10)
Chlordiazepoxide: NEGATIVE ng/mL (ref <0.50)
Clonazepam: NEGATIVE ng/mL (ref <0.50)
Cocaine: NEGATIVE ng/mL (ref <5.0)
Codeine: NEGATIVE ng/mL (ref <2.5)
Diazepam: NEGATIVE ng/mL (ref <0.50)
Dihydrocodeine: NEGATIVE ng/mL (ref <2.5)
Fentanyl: 3.28 ng/mL — ABNORMAL HIGH (ref <0.10)
Fentanyl: POSITIVE ng/mL — AB (ref <0.10)
Flunitrazepam: NEGATIVE ng/mL (ref <0.50)
Flurazepam: NEGATIVE ng/mL (ref <0.50)
Heroin Metabolite: NEGATIVE ng/mL (ref <1.0)
Hydrocodone: NEGATIVE ng/mL (ref <2.5)
Hydromorphone: NEGATIVE ng/mL (ref <2.5)
Lorazepam: 1.21 ng/mL — ABNORMAL HIGH (ref <0.50)
MARIJUANA: NEGATIVE ng/mL (ref <2.5)
MDMA: NEGATIVE ng/mL (ref <10)
Meprobamate: NEGATIVE ng/mL (ref <2.5)
Methadone: NEGATIVE ng/mL (ref <5.0)
Midazolam: NEGATIVE ng/mL (ref <0.50)
Morphine: 63.7 ng/mL — ABNORMAL HIGH (ref <2.5)
Nicotine Metabolite: NEGATIVE ng/mL (ref <5.0)
Nordiazepam: NEGATIVE ng/mL (ref <0.50)
Norhydrocodone: NEGATIVE ng/mL (ref <2.5)
Noroxycodone: NEGATIVE ng/mL (ref <2.5)
Opiates: POSITIVE ng/mL — AB (ref <2.5)
Oxazepam: NEGATIVE ng/mL (ref <0.50)
Oxycodone: NEGATIVE ng/mL (ref <2.5)
Oxymorphone: NEGATIVE ng/mL (ref <2.5)
Phencyclidine: NEGATIVE ng/mL (ref <10)
Tapentadol: NEGATIVE ng/mL (ref <5.0)
Temazepam: NEGATIVE ng/mL (ref <0.50)
Tramadol: NEGATIVE ng/mL (ref <5.0)
Triazolam: NEGATIVE ng/mL (ref <0.50)
Zolpidem: NEGATIVE ng/mL (ref <5.0)

## 2023-05-07 LAB — DRUG TOX ALC METAB W/CON, ORAL FLD: Alcohol Metabolite: NEGATIVE ng/mL (ref <25)

## 2023-05-09 ENCOUNTER — Telehealth: Payer: Self-pay | Admitting: Family Medicine

## 2023-05-09 NOTE — Telephone Encounter (Signed)
See note

## 2023-05-09 NOTE — Telephone Encounter (Signed)
Christopher [name] with [facility] states Patient's blood pressure today (05/09/23) is 158/100, Patient is also having some sweating and is constipated.

## 2023-05-09 NOTE — Telephone Encounter (Signed)
Noted.  Recommend he be seen if his blood pressure is persistently elevated or if he is having other symptoms.

## 2023-05-16 ENCOUNTER — Emergency Department (HOSPITAL_COMMUNITY): Payer: Medicare Other

## 2023-05-16 ENCOUNTER — Other Ambulatory Visit: Payer: Self-pay

## 2023-05-16 ENCOUNTER — Encounter (HOSPITAL_COMMUNITY): Payer: Self-pay

## 2023-05-16 ENCOUNTER — Inpatient Hospital Stay (HOSPITAL_COMMUNITY)
Admission: EM | Admit: 2023-05-16 | Discharge: 2023-05-18 | DRG: 698 | Disposition: A | Discharge status: Home Health | Payer: No Typology Code available for payment source | Attending: Internal Medicine | Admitting: Internal Medicine

## 2023-05-16 ENCOUNTER — Ambulatory Visit: Payer: PRIVATE HEALTH INSURANCE | Admitting: Psychology

## 2023-05-16 DIAGNOSIS — W06XXXA Fall from bed, initial encounter: Secondary | ICD-10-CM | POA: Diagnosis present

## 2023-05-16 DIAGNOSIS — S0101XA Laceration without foreign body of scalp, initial encounter: Secondary | ICD-10-CM

## 2023-05-16 DIAGNOSIS — Z87440 Personal history of urinary (tract) infections: Secondary | ICD-10-CM

## 2023-05-16 DIAGNOSIS — Z9359 Other cystostomy status: Secondary | ICD-10-CM

## 2023-05-16 DIAGNOSIS — Z1611 Resistance to penicillins: Secondary | ICD-10-CM | POA: Diagnosis present

## 2023-05-16 DIAGNOSIS — G709 Myoneural disorder, unspecified: Secondary | ICD-10-CM | POA: Diagnosis present

## 2023-05-16 DIAGNOSIS — Z23 Encounter for immunization: Secondary | ICD-10-CM | POA: Diagnosis not present

## 2023-05-16 DIAGNOSIS — K219 Gastro-esophageal reflux disease without esophagitis: Secondary | ICD-10-CM | POA: Diagnosis present

## 2023-05-16 DIAGNOSIS — Z87442 Personal history of urinary calculi: Secondary | ICD-10-CM

## 2023-05-16 DIAGNOSIS — F419 Anxiety disorder, unspecified: Secondary | ICD-10-CM | POA: Diagnosis present

## 2023-05-16 DIAGNOSIS — L89159 Pressure ulcer of sacral region, unspecified stage: Secondary | ICD-10-CM | POA: Diagnosis present

## 2023-05-16 DIAGNOSIS — E876 Hypokalemia: Secondary | ICD-10-CM | POA: Diagnosis present

## 2023-05-16 DIAGNOSIS — F339 Major depressive disorder, recurrent, unspecified: Secondary | ICD-10-CM | POA: Diagnosis present

## 2023-05-16 DIAGNOSIS — B9620 Unspecified Escherichia coli [E. coli] as the cause of diseases classified elsewhere: Secondary | ICD-10-CM | POA: Diagnosis present

## 2023-05-16 DIAGNOSIS — Z86718 Personal history of other venous thrombosis and embolism: Secondary | ICD-10-CM

## 2023-05-16 DIAGNOSIS — F32A Depression, unspecified: Secondary | ICD-10-CM | POA: Diagnosis present

## 2023-05-16 DIAGNOSIS — G9340 Encephalopathy, unspecified: Principal | ICD-10-CM

## 2023-05-16 DIAGNOSIS — Y92003 Bedroom of unspecified non-institutional (private) residence as the place of occurrence of the external cause: Secondary | ICD-10-CM

## 2023-05-16 DIAGNOSIS — M86652 Other chronic osteomyelitis, left thigh: Secondary | ICD-10-CM | POA: Diagnosis present

## 2023-05-16 DIAGNOSIS — B965 Pseudomonas (aeruginosa) (mallei) (pseudomallei) as the cause of diseases classified elsewhere: Secondary | ICD-10-CM | POA: Diagnosis present

## 2023-05-16 DIAGNOSIS — T83511A Infection and inflammatory reaction due to indwelling urethral catheter, initial encounter: Secondary | ICD-10-CM | POA: Diagnosis present

## 2023-05-16 DIAGNOSIS — Z933 Colostomy status: Secondary | ICD-10-CM

## 2023-05-16 DIAGNOSIS — Z8701 Personal history of pneumonia (recurrent): Secondary | ICD-10-CM

## 2023-05-16 DIAGNOSIS — W19XXXA Unspecified fall, initial encounter: Secondary | ICD-10-CM | POA: Diagnosis not present

## 2023-05-16 DIAGNOSIS — Y846 Urinary catheterization as the cause of abnormal reaction of the patient, or of later complication, without mention of misadventure at the time of the procedure: Secondary | ICD-10-CM | POA: Diagnosis present

## 2023-05-16 DIAGNOSIS — M8668 Other chronic osteomyelitis, other site: Secondary | ICD-10-CM | POA: Diagnosis not present

## 2023-05-16 DIAGNOSIS — R262 Difficulty in walking, not elsewhere classified: Secondary | ICD-10-CM | POA: Diagnosis present

## 2023-05-16 DIAGNOSIS — R410 Disorientation, unspecified: Secondary | ICD-10-CM | POA: Diagnosis present

## 2023-05-16 DIAGNOSIS — M25552 Pain in left hip: Secondary | ICD-10-CM | POA: Diagnosis present

## 2023-05-16 DIAGNOSIS — J9621 Acute and chronic respiratory failure with hypoxia: Secondary | ICD-10-CM | POA: Diagnosis present

## 2023-05-16 DIAGNOSIS — Z993 Dependence on wheelchair: Secondary | ICD-10-CM | POA: Diagnosis not present

## 2023-05-16 DIAGNOSIS — N3289 Other specified disorders of bladder: Secondary | ICD-10-CM | POA: Diagnosis present

## 2023-05-16 DIAGNOSIS — G9341 Metabolic encephalopathy: Secondary | ICD-10-CM | POA: Diagnosis present

## 2023-05-16 DIAGNOSIS — Z87891 Personal history of nicotine dependence: Secondary | ICD-10-CM | POA: Diagnosis not present

## 2023-05-16 DIAGNOSIS — Z7951 Long term (current) use of inhaled steroids: Secondary | ICD-10-CM

## 2023-05-16 DIAGNOSIS — N390 Urinary tract infection, site not specified: Secondary | ICD-10-CM | POA: Diagnosis present

## 2023-05-16 DIAGNOSIS — R000 Tachycardia, unspecified: Secondary | ICD-10-CM | POA: Diagnosis present

## 2023-05-16 DIAGNOSIS — S01112A Laceration without foreign body of left eyelid and periocular area, initial encounter: Secondary | ICD-10-CM | POA: Diagnosis present

## 2023-05-16 DIAGNOSIS — Z79899 Other long term (current) drug therapy: Secondary | ICD-10-CM

## 2023-05-16 DIAGNOSIS — G894 Chronic pain syndrome: Secondary | ICD-10-CM | POA: Diagnosis present

## 2023-05-16 DIAGNOSIS — I10 Essential (primary) hypertension: Secondary | ICD-10-CM | POA: Diagnosis present

## 2023-05-16 DIAGNOSIS — Z8619 Personal history of other infectious and parasitic diseases: Secondary | ICD-10-CM

## 2023-05-16 DIAGNOSIS — Z885 Allergy status to narcotic agent status: Secondary | ICD-10-CM

## 2023-05-16 LAB — CBC
HCT: 40.2 % (ref 39.0–52.0)
Hemoglobin: 13.1 g/dL (ref 13.0–17.0)
MCH: 30.1 pg (ref 26.0–34.0)
MCHC: 32.6 g/dL (ref 30.0–36.0)
MCV: 92.4 fL (ref 80.0–100.0)
Platelets: 240 10*3/uL (ref 150–400)
RBC: 4.35 MIL/uL (ref 4.22–5.81)
RDW: 12.8 % (ref 11.5–15.5)
WBC: 10.6 10*3/uL — ABNORMAL HIGH (ref 4.0–10.5)
nRBC: 0 % (ref 0.0–0.2)

## 2023-05-16 LAB — URINALYSIS, ROUTINE W REFLEX MICROSCOPIC
Bilirubin Urine: NEGATIVE
Glucose, UA: NEGATIVE mg/dL
Ketones, ur: NEGATIVE mg/dL
Nitrite: POSITIVE — AB
Protein, ur: 30 mg/dL — AB
Specific Gravity, Urine: >1.046 — ABNORMAL HIGH (ref 1.005–1.030)
pH: 7 (ref 5.0–8.0)

## 2023-05-16 LAB — COMPREHENSIVE METABOLIC PANEL
ALT: 34 U/L (ref 0–44)
AST: 36 U/L (ref 15–41)
Albumin: 3.6 g/dL (ref 3.5–5.0)
Alkaline Phosphatase: 98 U/L (ref 38–126)
Anion gap: 13 (ref 5–15)
BUN: 12 mg/dL (ref 6–20)
CO2: 27 mmol/L (ref 22–32)
Calcium: 10 mg/dL (ref 8.9–10.3)
Chloride: 96 mmol/L — ABNORMAL LOW (ref 98–111)
Creatinine, Ser: 0.97 mg/dL (ref 0.61–1.24)
GFR, Estimated: >60 mL/min (ref >60)
Glucose, Bld: 115 mg/dL — ABNORMAL HIGH (ref 70–99)
Potassium: 2.7 mmol/L — CL (ref 3.5–5.1)
Sodium: 136 mmol/L (ref 135–145)
Total Bilirubin: 0.5 mg/dL (ref <1.2)
Total Protein: 7.6 g/dL (ref 6.5–8.1)

## 2023-05-16 LAB — I-STAT CHEM 8, ED
BUN: 14 mg/dL (ref 6–20)
Calcium, Ion: 1.22 mmol/L (ref 1.15–1.40)
Chloride: 94 mmol/L — ABNORMAL LOW (ref 98–111)
Creatinine, Ser: 1 mg/dL (ref 0.61–1.24)
Glucose, Bld: 117 mg/dL — ABNORMAL HIGH (ref 70–99)
HCT: 40 % (ref 39.0–52.0)
Hemoglobin: 13.6 g/dL (ref 13.0–17.0)
Potassium: 2.7 mmol/L — CL (ref 3.5–5.1)
Sodium: 138 mmol/L (ref 135–145)
TCO2: 32 mmol/L (ref 22–32)

## 2023-05-16 LAB — BLOOD GAS, VENOUS
Acid-Base Excess: 5.7 mmol/L — ABNORMAL HIGH (ref 0.0–2.0)
Bicarbonate: 32.1 mmol/L — ABNORMAL HIGH (ref 20.0–28.0)
O2 Saturation: 73.5 %
Patient temperature: 36.4
pCO2, Ven: 52 mm[Hg] (ref 44–60)
pH, Ven: 7.4 (ref 7.25–7.43)
pO2, Ven: 40 mm[Hg] (ref 32–45)

## 2023-05-16 LAB — MAGNESIUM: Magnesium: 1.8 mg/dL (ref 1.7–2.4)

## 2023-05-16 LAB — SAMPLE TO BLOOD BANK

## 2023-05-16 LAB — HIV ANTIBODY (ROUTINE TESTING W REFLEX): HIV Screen 4th Generation wRfx: NONREACTIVE

## 2023-05-16 LAB — ETHANOL: Alcohol, Ethyl (B): <10 mg/dL (ref <10)

## 2023-05-16 LAB — PROTIME-INR
INR: 1.1 (ref 0.8–1.2)
Prothrombin Time: 14.3 s (ref 11.4–15.2)

## 2023-05-16 LAB — I-STAT CG4 LACTIC ACID, ED: Lactic Acid, Venous: 1.3 mmol/L (ref 0.5–1.9)

## 2023-05-16 MED ORDER — VANCOMYCIN HCL 125 MG PO CAPS
125.0000 mg | ORAL_CAPSULE | Freq: Two times a day (BID) | ORAL | Status: DC
  Administered 2023-05-16 – 2023-05-18 (×4): 125 mg via ORAL
  Filled 2023-05-16 (×6): qty 1

## 2023-05-16 MED ORDER — FENTANYL CITRATE PF 50 MCG/ML IJ SOSY
50.0000 ug | PREFILLED_SYRINGE | Freq: Once | INTRAMUSCULAR | Status: AC
  Administered 2023-05-16: 50 ug via INTRAVENOUS
  Filled 2023-05-16: qty 1

## 2023-05-16 MED ORDER — SODIUM CHLORIDE 0.9% FLUSH
3.0000 mL | Freq: Two times a day (BID) | INTRAVENOUS | Status: DC
  Administered 2023-05-16 – 2023-05-18 (×5): 3 mL via INTRAVENOUS

## 2023-05-16 MED ORDER — OLANZAPINE 5 MG PO TABS
2.5000 mg | ORAL_TABLET | Freq: Two times a day (BID) | ORAL | Status: DC
Start: 2023-05-17 — End: 2023-05-18
  Administered 2023-05-17 – 2023-05-18 (×3): 2.5 mg via ORAL
  Filled 2023-05-16 (×3): qty 1

## 2023-05-16 MED ORDER — IOHEXOL 350 MG/ML SOLN
75.0000 mL | Freq: Once | INTRAVENOUS | Status: AC | PRN
  Administered 2023-05-16: 75 mL via INTRAVENOUS

## 2023-05-16 MED ORDER — ONDANSETRON HCL 4 MG PO TABS: 4.0000 mg | ORAL_TABLET | Freq: Four times a day (QID) | ORAL | Status: DC | PRN

## 2023-05-16 MED ORDER — TETANUS-DIPHTH-ACELL PERTUSSIS 5-2.5-18.5 LF-MCG/0.5 IM SUSY
0.5000 mL | PREFILLED_SYRINGE | Freq: Once | INTRAMUSCULAR | Status: AC
  Administered 2023-05-16: 0.5 mL via INTRAMUSCULAR
  Filled 2023-05-16: qty 0.5

## 2023-05-16 MED ORDER — MORPHINE SULFATE 15 MG PO TABS
15.0000 mg | ORAL_TABLET | Freq: Four times a day (QID) | ORAL | Status: DC | PRN
  Administered 2023-05-16 – 2023-05-17 (×3): 15 mg via ORAL
  Filled 2023-05-16 (×3): qty 1

## 2023-05-16 MED ORDER — ONDANSETRON HCL 4 MG/2ML IJ SOLN: 4.0000 mg | Freq: Four times a day (QID) | INTRAMUSCULAR | Status: DC | PRN

## 2023-05-16 MED ORDER — SODIUM CHLORIDE 0.9 % IV SOLN
1.0000 g | Freq: Once | INTRAVENOUS | Status: AC
  Administered 2023-05-16: 1 g via INTRAVENOUS
  Filled 2023-05-16: qty 10

## 2023-05-16 MED ORDER — MORPHINE SULFATE 15 MG PO TABS: 15.0000 mg | ORAL_TABLET | ORAL | Status: DC | PRN

## 2023-05-16 MED ORDER — LORAZEPAM 2 MG/ML IJ SOLN
1.0000 mg | Freq: Once | INTRAMUSCULAR | Status: AC
  Administered 2023-05-16: 1 mg via INTRAVENOUS
  Filled 2023-05-16: qty 1

## 2023-05-16 MED ORDER — CIPROFLOXACIN HCL 500 MG PO TABS
500.0000 mg | ORAL_TABLET | Freq: Two times a day (BID) | ORAL | Status: DC
  Administered 2023-05-16 – 2023-05-18 (×4): 500 mg via ORAL
  Filled 2023-05-16 (×5): qty 1

## 2023-05-16 MED ORDER — POTASSIUM CHLORIDE CRYS ER 20 MEQ PO TBCR
60.0000 meq | EXTENDED_RELEASE_TABLET | Freq: Once | ORAL | Status: DC
  Filled 2023-05-16: qty 3

## 2023-05-16 MED ORDER — ACETAMINOPHEN 325 MG PO TABS
650.0000 mg | ORAL_TABLET | Freq: Four times a day (QID) | ORAL | Status: DC | PRN
  Administered 2023-05-17: 650 mg via ORAL
  Filled 2023-05-16: qty 2

## 2023-05-16 MED ORDER — FENTANYL 75 MCG/HR TD PT72
1.0000 | MEDICATED_PATCH | TRANSDERMAL | Status: DC
  Administered 2023-05-16: 1 via TRANSDERMAL
  Filled 2023-05-16: qty 1

## 2023-05-16 MED ORDER — OLANZAPINE 5 MG PO TABS: 10.0000 mg | ORAL_TABLET | Freq: Every day | ORAL | Status: DC

## 2023-05-16 MED ORDER — LIDOCAINE-EPINEPHRINE (PF) 2 %-1:200000 IJ SOLN
20.0000 mL | Freq: Once | INTRAMUSCULAR | Status: AC
  Administered 2023-05-16: 20 mL
  Filled 2023-05-16: qty 20

## 2023-05-16 MED ORDER — PAROXETINE HCL 20 MG PO TABS
40.0000 mg | ORAL_TABLET | Freq: Every day | ORAL | Status: DC
  Administered 2023-05-16 – 2023-05-17 (×2): 40 mg via ORAL
  Filled 2023-05-16 (×2): qty 2

## 2023-05-16 MED ORDER — OLANZAPINE 5 MG PO TABS: 2.5000 mg | ORAL_TABLET | ORAL | Status: DC

## 2023-05-16 MED ORDER — ENOXAPARIN SODIUM 40 MG/0.4ML IJ SOSY
40.0000 mg | PREFILLED_SYRINGE | INTRAMUSCULAR | Status: DC
Start: 2023-05-16 — End: 2023-05-18
  Administered 2023-05-17 – 2023-05-18 (×2): 40 mg via SUBCUTANEOUS
  Filled 2023-05-16 (×2): qty 0.4

## 2023-05-16 MED ORDER — ALBUTEROL SULFATE (2.5 MG/3ML) 0.083% IN NEBU: 2.5000 mg | INHALATION_SOLUTION | Freq: Four times a day (QID) | RESPIRATORY_TRACT | Status: DC | PRN

## 2023-05-16 MED ORDER — OLANZAPINE 5 MG PO TABS
10.0000 mg | ORAL_TABLET | Freq: Every day | ORAL | Status: DC
  Administered 2023-05-16 – 2023-05-17 (×2): 10 mg via ORAL
  Filled 2023-05-16 (×2): qty 2

## 2023-05-16 MED ORDER — SODIUM CHLORIDE 0.9 % IV SOLN
1.0000 g | INTRAVENOUS | Status: DC
  Administered 2023-05-17 – 2023-05-18 (×2): 1 g via INTRAVENOUS
  Filled 2023-05-16 (×2): qty 10

## 2023-05-16 MED ORDER — LORAZEPAM 0.5 MG PO TABS: 0.5000 mg | ORAL_TABLET | Freq: Three times a day (TID) | ORAL | Status: DC | PRN

## 2023-05-16 MED ORDER — ACETAMINOPHEN 650 MG RE SUPP: 650.0000 mg | Freq: Four times a day (QID) | RECTAL | Status: DC | PRN

## 2023-05-16 MED ORDER — IMIPRAMINE HCL 25 MG PO TABS
25.0000 mg | ORAL_TABLET | Freq: Three times a day (TID) | ORAL | Status: DC
  Administered 2023-05-16 – 2023-05-18 (×5): 25 mg via ORAL
  Filled 2023-05-16 (×7): qty 1

## 2023-05-16 MED ORDER — POTASSIUM CHLORIDE 10 MEQ/100ML IV SOLN
10.0000 meq | INTRAVENOUS | Status: AC
  Administered 2023-05-16 (×3): 10 meq via INTRAVENOUS
  Filled 2023-05-16 (×3): qty 100

## 2023-05-16 MED ORDER — OLANZAPINE 5 MG PO TABS: 2.5000 mg | ORAL_TABLET | Freq: Two times a day (BID) | ORAL | Status: DC

## 2023-05-16 NOTE — Telephone Encounter (Signed)
Spoke with Clydie Braun with [facility] [phone] Stated was supposed to visit patient today, was unable to see him due to patient been in the hospital  Unsure how BP was during the holidays  Advise to schedule an office visit with PCP for BP check and Hospital F/U
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
## 2023-05-16 NOTE — ED Provider Notes (Signed)
  Physical Exam  BP 102/86   Pulse (!) 108   Temp (!) 97.5 F (36.4 C)   Resp 20   Ht 6\' 3"  (1.905 m)   Wt 52.2 kg   SpO2 99%   BMI 14.37 kg/m   Physical Exam  Procedures  .Laceration Repair  Date/Time: 05/16/2023 5:54 AM  Performed by: Darrick Grinder, PA-C Authorized by: Darrick Grinder, PA-C   Consent:    Consent obtained:  Verbal   Consent given by:  Patient   Risks discussed:  Infection, pain, need for additional repair, poor cosmetic result and poor wound healing Universal protocol:    Patient identity confirmed:  Verbally with patient Anesthesia:    Anesthesia method:  None Laceration details:    Location:  Face   Face location:  L eyebrow   Length (cm):  2.7 Exploration:    Hemostasis achieved with:  Direct pressure Treatment:    Area cleansed with:  Saline   Amount of cleaning:  Standard Skin repair:    Repair method:  Tissue adhesive Approximation:    Approximation:  Close Repair type:    Repair type:  Simple Post-procedure details:    Dressing:  Open (no dressing)   Procedure completion:  Tolerated well, no immediate complications   ED Course / MDM    Medical Decision Making Amount and/or Complexity of Data Reviewed Labs: ordered. Radiology: ordered.  Risk Prescription drug management.         Pamala Duffel 05/16/23 1610    Glynn Octave, MD 05/16/23 [phone]

## 2023-05-16 NOTE — ED Notes (Signed)
CCMD called and patient placed on monitor

## 2023-05-16 NOTE — ED Notes (Signed)
ED TO INPATIENT HANDOFF REPORT  ED Nurse Name and Phone #:  Larey Brick, RN [phone]  S Name/Age/Gender Christopher [name] 57 y.o. male Room/Bed: 008C/008C  Code Status   Code Status: Full Code  Home/SNF/Other Home Patient oriented to: self, place, time, and situation Is this baseline? Yes   Triage Complete: Triage complete  Chief Complaint Fall, initial encounter [phone].[phone]]  Triage Note Pt came from home for an unwitnessed fall. Wife noticed patient confused leading up to fall. EMS noted confusion with repetitive questions. Pt wheelchair bound for the past five years, normally can transfer self from chair to bed.  EMS VS 122/80 110 HR 94% 4L [place] 127 CBG   Allergies Allergies  Allergen Reactions   Methadone Other (See Comments)    Hallucinations/confusion    Level of Care/Admitting Diagnosis ED Disposition     ED Disposition  Admit   Condition  --   Comment  Hospital Area[facility] [[id number]]  Level of Care: Telemetry Surgical [[id number]]  May admit patient to [facility] or [facility] if equivalent level of care is available:: No  Covid Evaluation: Asymptomatic - no recent exposure (last 10 days) testing not required  Diagnosis: Fall, initial encounter [phone]  Admitting Physician: Darlin Drop [[id number]]  Attending Physician: Darlin Drop [[id number]]  Certification:: I certify this patient will need inpatient services for at least 2 midnights  Expected Medical Readiness: 05/18/2023          B Medical/Surgery History Past Medical History:  Diagnosis Date   Acute on chronic respiratory failure with hypoxia (HCC) 06/2018   trach removed 11-16-2018, on vent from jan until may 2020 - uses albuterol prn   Anxiety    Bacteremia due to Pseudomonas 06/2018   Chronic osteomyelitis (HCC)    Chronic pain syndrome    Clostridium difficile colitis 10/30/2019   tx with abx    Depression    DVT (deep venous thrombosis) (HCC) 2020   right brachial post  PICC line   History of blood transfusion 06/2018   History of Clostridioides difficile colitis    History of kidney stones    Hypertension    norvasc d/c by pcp on 11/05/19   Multiple traumatic injuries    Penile pain 11/18/2019   Pneumonia 11/2009   2020 x 2   Walker as ambulation aid    Wheelchair bound    electric   Wound discharge    left hip wound with bloody/clear drainage change dressing q day surgilube with gauze, between legs wound using calcium algenate pad bid   Past Surgical History:  Procedure Laterality Date   APPLICATION OF A-CELL OF BACK N/A 08/06/2018   Procedure: Application Of A-Cell Of Back;  Surgeon: Peggye Form, DO;  [facility];  Service: Plastics;  Laterality: N/A;   APPLICATION OF A-CELL OF EXTREMITY Left 08/06/2018   Procedure: Application Of A-Cell Of Extremity;  Surgeon: Peggye Form, DO;  [facility];  Service: Plastics;  Laterality: Left;   APPLICATION OF A-CELL OF EXTREMITY Left 09/18/2019   Procedure: APPLICATION OF A-CELL OF EXTREMITY;  Surgeon: Peggye Form, DO;  [facility];  Service: Plastics;  Laterality: Left;   APPLICATION OF WOUND VAC  07/12/2018   Procedure: Application Of Wound Vac to the Left Thigh and Scrotum.;  Surgeon: Roby Lofts, MD;  [facility];  Service: Orthopedics;;   APPLICATION OF WOUND VAC  07/10/2018   Procedure: Application Of Wound Vac;  Surgeon: Berna Bue, MD;  Location: [facility] OR;  Service: General;;   BIOPSY  11/02/2022   Procedure: BIOPSY;  Surgeon: Lemar Lofty., MD;  Location: [facility];  Service: Gastroenterology;;   COLON SURGERY  2020   colostomy   COLOSTOMY N/A 07/23/2018   Procedure: COLOSTOMY;  Surgeon: Violeta Gelinas, MD;  Location: [facility] OR;  Service: General;  Laterality: N/A;   CYSTOSCOPY W/ URETERAL STENT PLACEMENT N/A 07/15/2018   Procedure: RETROGRADE URETHROGRAM;  Surgeon: Marcine Matar, MD;  Location: [facility] OR;  Service: Urology;  Laterality: N/A;    CYSTOSCOPY WITH LITHOLAPAXY N/A 05/06/2019   Procedure: CYSTOSCOPY BASKET BLADDER STONE EXTRACTION;  Surgeon: Malen Gauze, MD;  Location: [facility];  Service: Urology;  Laterality: N/A;  30 MINS   CYSTOSTOMY N/A 05/06/2019   Procedure: REPLACEMENT OF SUPRAPUBIC CATHETER;  Surgeon: Malen Gauze, MD;  Location: [facility];  Service: Urology;  Laterality: N/A;   DEBRIDEMENT AND CLOSURE WOUND Left 03/04/2019   Procedure: Excision of hip wound with placement of Acell;  Surgeon: Peggye Form, DO;  [facility];  Service: Plastics;  Laterality: Left;   ESOPHAGOGASTRODUODENOSCOPY N/A 08/14/2018   Procedure: ESOPHAGOGASTRODUODENOSCOPY (EGD);  Surgeon: Violeta Gelinas, MD;  Location: [facility] ENDOSCOPY;  Service: General;  Laterality: N/A;  bedside   ESOPHAGOGASTRODUODENOSCOPY (EGD) WITH PROPOFOL N/A 11/02/2022   Procedure: ESOPHAGOGASTRODUODENOSCOPY (EGD) WITH PROPOFOL;  Surgeon: Meridee Score Netty Starring., MD;  Location: [facility];  Service: Gastroenterology;  Laterality: N/A;   FACIAL RECONSTRUCTION SURGERY     X 2--once as a teenager and second time in his 45's   HARDWARE REMOVAL Left 03/04/2019   Procedure: Left Hip Hardware Removal;  Surgeon: Roby Lofts, MD;  [facility];  Service: Orthopedics;  Laterality: Left;   HEMOSTASIS CLIP PLACEMENT  11/02/2022   Procedure: HEMOSTASIS CLIP PLACEMENT;  Surgeon: Lemar Lofty., MD;  Location: [facility];  Service: Gastroenterology;;   HIP PINNING,CANNULATED Left 07/12/2018   Procedure: CANNULATED HIP PINNING;  Surgeon: Roby Lofts, MD;  [facility];  Service: Orthopedics;  Laterality: Left;   HIP SURGERY     HOLMIUM LASER APPLICATION Right 07/15/2019   Procedure: HOLMIUM LASER APPLICATION;  Surgeon: Malen Gauze, MD;  Location: [facility];  Service: Urology;  Laterality: Right;   HOT HEMOSTASIS N/A 11/02/2022   Procedure: HOT HEMOSTASIS (ARGON PLASMA  COAGULATION/BICAP);  Surgeon: Lemar Lofty., MD;  Location: [facility];  Service: Gastroenterology;  Laterality: N/A;   I & D EXTREMITY Left 07/25/2018   Procedure: Debridement of buttock, scrotum and left leg, placement of acell and vac;  Surgeon: Peggye Form, DO;  [facility];  Service: Plastics;  Laterality: Left;   I & D EXTREMITY N/A 08/06/2018   Procedure: Debridement of buttock, scrotum and left leg;  Surgeon: Peggye Form, DO;  [facility];  Service: Plastics;  Laterality: N/A;   I & D EXTREMITY N/A 08/13/2018   Procedure: Debridement of buttock, scrotum and left leg, placement of acell and vac;  Surgeon: Peggye Form, DO;  [facility];  Service: Plastics;  Laterality: N/A;  90 min, please   INCISION AND DRAINAGE HIP Left 09/18/2019   Procedure: IRRIGATION AND DEBRIDEMENT HIP/ PELVIS WITH WOUND VAC PLACEMENT;  Surgeon: Roby Lofts, MD;  [facility];  Service: Orthopedics;  Laterality: Left;   INCISION AND DRAINAGE OF WOUND N/A 07/18/2018   Procedure: Debridement of left leg, buttocks and scrotal wound with  placement of acell and Flexiseal;  Surgeon: Peggye Form, DO;  [facility];  Service: Plastics;  Laterality: N/A;   INCISION AND DRAINAGE OF WOUND Left 08/29/2018   Procedure: Debridement of buttock, scrotum and left leg, placement of acell and vac;  Surgeon: Peggye Form, DO;  [facility];  Service: Plastics;  Laterality: Left;  75 min, please   INCISION AND DRAINAGE OF WOUND Bilateral 10/23/2018   Procedure: DEBRIDEMENT OF BUTTOCK,SCROTUM, AND LEG WOUNDS WITH PLACEMENT OF ACELL- BILATERAL 90 MIN;  Surgeon: Peggye Form, DO;  [facility];  Service: Plastics;  Laterality: Bilateral;   IR ANGIOGRAM PELVIS SELECTIVE OR SUPRASELECTIVE  07/10/2018   IR ANGIOGRAM PELVIS SELECTIVE OR SUPRASELECTIVE  07/10/2018   IR ANGIOGRAM SELECTIVE EACH ADDITIONAL VESSEL  07/10/2018   IR EMBO ART  VEN HEMORR LYMPH EXTRAV  INC  GUIDE ROADMAPPING  07/10/2018   IR GASTROSTOMY TUBE MOD SED  01/21/2021   IR NEPHROSTOMY PLACEMENT LEFT  04/05/2019   IR NEPHROSTOMY PLACEMENT RIGHT  05/31/2019   IR US GUIDE BX ASP/DRAIN  07/10/2018   IR US GUIDE VASC ACCESS RIGHT  07/10/2018   IR VENO/EXT/UNI LEFT  07/10/2018   IRRIGATION AND DEBRIDEMENT OF WOUND WITH SPLIT THICKNESS SKIN GRAFT Left 09/19/2018   Procedure: Debridement of gluteal wound with placement of acell to left leg.;  Surgeon: Peggye Form, DO;  [facility];  Service: Plastics;  Laterality: Left;  2.5 hours, please   LAPAROTOMY N/A 07/12/2018   Procedure: EXPLORATORY LAPAROTOMY;  Surgeon: Violeta Gelinas, MD;  Location: [facility] OR;  Service: General;  Laterality: N/A;   LAPAROTOMY N/A 07/15/2018   Procedure: WOUND EXPLORATION; CLOSURE OF ABDOMEN;  Surgeon: Violeta Gelinas, MD;  Location: [facility] OR;  Service: General;  Laterality: N/A;   LAPAROTOMY  07/10/2018   Procedure: Exploratory Laparotomy;  Surgeon: Berna Bue, MD;  Location: [facility] OR;  Service: General;;   MASS EXCISION Left 09/18/2019   Procedure: EXCISION UPPER LEFT INNER THIGH WOUND;  Surgeon: Peggye Form, DO;  [facility];  Service: Plastics;  Laterality: Left;   NEPHROLITHOTOMY Right 07/15/2019   Procedure: NEPHROLITHOTOMY PERCUTANEOUS;  Surgeon: Malen Gauze, MD;  Location: [facility];  Service: Urology;  Laterality: Right;  90 MINS   PEG PLACEMENT N/A 08/14/2018   Procedure: PERCUTANEOUS ENDOSCOPIC GASTROSTOMY (PEG) PLACEMENT;  Surgeon: Violeta Gelinas, MD;  Location: [facility] ENDOSCOPY;  Service: General;  Laterality: N/A;   PERCUTANEOUS TRACHEOSTOMY N/A 08/02/2018   Procedure: PERCUTANEOUS TRACHEOSTOMY;  Surgeon: Violeta Gelinas, MD;  Location: [facility] OR;  Service: General;  Laterality: N/A;   RADIOLOGY WITH ANESTHESIA N/A 07/10/2018   Procedure: IR WITH ANESTHESIA;  Surgeon: Simonne Come, MD;  Location: [facility];  Service: Radiology;  Laterality: N/A;   RADIOLOGY WITH ANESTHESIA Right  07/10/2018   Procedure: Ir With Anesthesia;  Surgeon: Simonne Come, MD;  Location: [facility] OR;  Service: Radiology;  Laterality: Right;   SAVORY DILATION N/A 11/02/2022   Procedure: SAVORY DILATION;  Surgeon: Meridee Score Netty Starring., MD;  Location: [facility];  Service: Gastroenterology;  Laterality: N/A;   SCROTAL EXPLORATION N/A 07/15/2018   Procedure: SCROTUM DEBRIDEMENT;  Surgeon: Marcine Matar, MD;  Location: [facility] OR;  Service: Urology;  Laterality: N/A;   SHOULDER SURGERY     SKIN SPLIT GRAFT Right 09/19/2018   Procedure: Skin Graft Split Thickness;  Surgeon: Peggye Form, DO;  [facility];  Service: Plastics;  Laterality: Right;   SKIN SPLIT GRAFT N/A 10/03/2018  Procedure: Split thickness skin graft to gluteal area with acell placement;  Surgeon: Peggye Form, DO;  [facility];  Service: Plastics;  Laterality: N/A;  3 hours, please   VACUUM ASSISTED CLOSURE CHANGE N/A 07/12/2018   Procedure: ABDOMINAL VACUUM ASSISTED CLOSURE CHANGE and abdominal washout;  Surgeon: Violeta Gelinas, MD;  Location: [facility] OR;  Service: General;  Laterality: N/A;   WOUND DEBRIDEMENT Left 07/23/2018   Procedure: DEBRIDEMENT LEFT BUTTOCK  WOUND;  Surgeon: Violeta Gelinas, MD;  Location: [facility] OR;  Service: General;  Laterality: Left;   WOUND EXPLORATION Left 07/10/2018   Procedure: WOUND EXPLORATION LEFT GROIN;  Surgeon: Berna Bue, MD;  Location: [facility] OR;  Service: General;  Laterality: Left;     A IV Location/Drains/Wounds Patient Lines/Drains/Airways Status     Active Line/Drains/Airways     Name Placement date Placement time Site Days   Peripheral IV 11/02/22 20 G Right Hand 11/02/22  0738  Hand  195   Peripheral IV 05/16/23 20 G 1" Posterior;Right Forearm 05/16/23  --  Forearm  less than 1   Gastrostomy/Enterostomy Gastrostomy 20 Fr. LUQ 01/21/21  1052  LUQ  845   Colostomy LLQ 11/22/20  --  LLQ  905   Suprapubic Catheter Latex 20 Fr. 05/06/19  1157  Latex  1471   Wound / Incision  (Open or Dehisced) 06/22/20 Ischial tuberosity Right 06/22/20  1657  Ischial tuberosity  1058   Wound / Incision (Open or Dehisced) 10/14/20 Non-pressure wound Hip Anterior;Left red, packing, osteomyelitis 10/14/20  2100  Hip  944   Wound / Incision (Open or Dehisced) 11/22/20 Non-pressure wound Back Left 11/22/20  2145  Back  905            Intake/Output Last 24 hours No intake or output data in the 24 hours ending 05/16/23 1656  Labs/Imaging Results for orders placed or performed during the hospital encounter of 05/16/23 (from the past 48 hour(s))  Sample to Blood Bank     Status: None   Collection Time: 05/16/23  2:15 AM  Result Value Ref Range   Blood Bank Specimen SAMPLE AVAILABLE FOR TESTING    Sample Expiration      05/19/2023,2359 Performed at [facility], [address] [place], [place] [address]   I-Stat Chem 8, ED     Status: Abnormal   Collection Time: 05/16/23  2:28 AM  Result Value Ref Range   Sodium 138 135 - 145 mmol/L   Potassium 2.7 (LL) 3.5 - 5.1 mmol/L   Chloride 94 (L) 98 - 111 mmol/L   BUN 14 6 - 20 mg/dL   Creatinine, Ser 6.04 0.61 - 1.24 mg/dL   Glucose, Bld 540 (H) 70 - 99 mg/dL    Comment: Glucose reference range applies only to samples taken after fasting for at least 8 hours.   Calcium, Ion 1.22 1.15 - 1.40 mmol/L   TCO2 32 22 - 32 mmol/L   Hemoglobin 13.6 13.0 - 17.0 g/dL   HCT 98.1 19.1 - 47.8 %   Comment NOTIFIED PHYSICIAN   I-Stat Lactic Acid, ED     Status: None   Collection Time: 05/16/23  2:28 AM  Result Value Ref Range   Lactic Acid, Venous 1.3 0.5 - 1.9 mmol/L  Comprehensive metabolic panel     Status: Abnormal   Collection Time: 05/16/23  2:29 AM  Result Value Ref Range   Sodium 136 135 - 145 mmol/L   Potassium 2.7 (LL) 3.5 - 5.1  mmol/L    Comment: CRITICAL RESULT CALLED TO, READ BACK BY AND VERIFIED WITH Loma Messing, RN [phone] 05/16/2023 SANDOVAL K   Chloride 96 (L) 98 - 111 mmol/L   CO2 27 22 - 32 mmol/L   Glucose, Bld 115  (H) 70 - 99 mg/dL    Comment: Glucose reference range applies only to samples taken after fasting for at least 8 hours.   BUN 12 6 - 20 mg/dL   Creatinine, Ser 4.69 0.61 - 1.24 mg/dL   Calcium 62.9 8.9 - 52.8 mg/dL   Total Protein 7.6 6.5 - 8.1 g/dL   Albumin 3.6 3.5 - 5.0 g/dL   AST 36 15 - 41 U/L   ALT 34 0 - 44 U/L   Alkaline Phosphatase 98 38 - 126 U/L   Total Bilirubin 0.5 <1.2 mg/dL   GFR, Estimated >41 >32 mL/min    Comment: (NOTE) Calculated using the CKD-EPI Creatinine Equation (2021)    Anion gap 13 5 - 15    Comment: Performed at [facility], [address] [place], [place] [address]  CBC     Status: Abnormal   Collection Time: 05/16/23  2:29 AM  Result Value Ref Range   WBC 10.6 (H) 4.0 - 10.5 K/uL   RBC 4.35 4.22 - 5.81 MIL/uL   Hemoglobin 13.1 13.0 - 17.0 g/dL   HCT 27.2 53.6 - 64.4 %   MCV 92.4 80.0 - 100.0 fL   MCH 30.1 26.0 - 34.0 pg   MCHC 32.6 30.0 - 36.0 g/dL   RDW 03.4 74.2 - 59.5 %   Platelets 240 150 - 400 K/uL   nRBC 0.0 0.0 - 0.2 %    Comment: Performed at [facility], [address] [place], [place] [address]  Ethanol     Status: None   Collection Time: 05/16/23  2:29 AM  Result Value Ref Range   Alcohol, Ethyl (B) <10 <10 mg/dL    Comment: (NOTE) Lowest detectable limit for serum alcohol is 10 mg/dL.  For medical purposes only. Performed at [facility], [address], [place], [place] [address]   Protime-INR     Status: None   Collection Time: 05/16/23  2:29 AM  Result Value Ref Range   Prothrombin Time 14.3 11.4 - 15.2 seconds   INR 1.1 0.8 - 1.2    Comment: (NOTE) INR goal varies based on device and disease states. Performed at [facility], [address], [place], [place] [address]   Urinalysis, Routine w reflex microscopic -Urine, Suprapubic     Status: Abnormal   Collection Time: 05/16/23  3:05 AM  Result Value Ref Range   Color, Urine YELLOW YELLOW   APPearance HAZY (A) CLEAR   Specific Gravity,  Urine >1.046 (H) 1.005 - 1.030   pH 7.0 5.0 - 8.0   Glucose, UA NEGATIVE NEGATIVE mg/dL   Hgb urine dipstick SMALL (A) NEGATIVE   Bilirubin Urine NEGATIVE NEGATIVE   Ketones, ur NEGATIVE NEGATIVE mg/dL   Protein, ur 30 (A) NEGATIVE mg/dL   Nitrite POSITIVE (A) NEGATIVE   Leukocytes,Ua LARGE (A) NEGATIVE   RBC / HPF 21-50 0 - 5 RBC/hpf   WBC, UA 21-50 0 - 5 WBC/hpf   Bacteria, UA MANY (A) NONE SEEN   Squamous Epithelial / HPF 0-5 0 - 5 /HPF   Mucus PRESENT     Comment: Performed at [facility], [address], [place], [place] [address]  HIV Antibody (routine testing w rflx)     Status: None   Collection Time: 05/16/23  7:52 AM  Result Value Ref Range   HIV Screen 4th Generation wRfx Non Reactive Non Reactive    Comment: Performed at [facility], [address], [place], [place] [address]  Magnesium     Status: None   Collection Time: 05/16/23  7:52 AM  Result Value Ref Range   Magnesium 1.8 1.7 - 2.4 mg/dL    Comment: Performed at [facility], [address] [place], [place] [address]   *Note: Due to a large number of results and/or encounters for the requested time period, some results have not been displayed. A complete set of results can be found in Results Review.   CT CHEST ABDOMEN PELVIS W CONTRAST  Result Date: 05/16/2023 CLINICAL DATA:  57 year old male status post fall. Chronically wheelchair-bound. EXAM: CT CHEST, ABDOMEN, AND PELVIS WITH CONTRAST TECHNIQUE: Multidetector CT imaging of the chest, abdomen and pelvis was performed following the standard protocol during bolus administration of intravenous contrast. RADIATION DOSE REDUCTION: This exam was performed according to the departmental dose-optimization program which includes automated exposure control, adjustment of the mA and/or kV according to patient size and/or use of iterative reconstruction technique. CONTRAST:  75mL OMNIPAQUE IOHEXOL 350 MG/ML SOLN COMPARISON:  CTA chest 10/14/2020. CT  Abdomen and Pelvis 03/06/2022. FINDINGS: CT CHEST FINDINGS Cardiovascular: No cardiomegaly. Major mediastinal vascular structures appear intact. Mild for age calcified atherosclerosis in the chest. Mediastinum/Nodes: Stable, no no mediastinal mass, lymphadenopathy, hematoma. Lungs/Pleura: Chronic and unresolved right lower lobe atelectasis or consolidation, stable from the CT Abdomen and Pelvis last year. Superimposed contralateral left lower lung fibrothorax with chronic pleural thickening, pleural calcification, adjacent left lower lobe atelectasis or scarring. Superimposed anterior left lung pleural thickening and calcification also. All stable from last year. Mild superimposed respiratory motion today. Major airways are patent although with increased retained secretions in the right trachea and carina on series 5, image 55. Substantially regressed but not completely resolved right upper lobe and middle lobe peribronchial opacity since 2022. Stable mild apical lung scarring. Musculoskeletal: Stable.  No acute osseous abnormality identified. CT ABDOMEN PELVIS FINDINGS Hepatobiliary: Liver and gallbladder appear stable and intact. No perihepatic fluid. Pancreas: Stable chronic pancreatic atrophy. Spleen: Stable and intact.  No perisplenic fluid. Adrenals/Urinary Tract: Small chronic right adrenal adenoma is stable since 2020 (no follow-up imaging recommended). Negative left adrenal gland. Nonobstructed kidneys appear intact. Symmetric renal enhancement and early contrast excretion. Decompressed ureters. Chronic suprapubic bladder catheter appears stable since last year. Bladder is decompressed. 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 Decompressed blind-ending distal sigmoid and rectum. No dilated small bowel. No free air or free fluid. No active bowel inflammation identified. Vascular/Lymphatic: Aortoiliac calcified atherosclerosis.  Normal caliber abdominal aorta. Chronic right internal iliac artery embolization. Otherwise major arterial structures grossly patent. Portal venous system grossly patent. No lymphadenopathy identified. Reproductive: Negative. Other: No pelvis free fluid. 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 Chronic vacuum disc there. Pronounced chronic left hemipelvic deformity, left hemipelvis and proximal left femur ORIF. Chronic pubic rami fractures. Bone detail in the pelvis and proximal femurs is degraded by motion artifact. Chronic heterotopic calcification or ossification along the chronically fractured left SI joint. Chronic soft tissue deficiency overlying the sacrum and coccyx, but stable compared to last year. No acute osseous abnormality identified. Chronic decubitus soft tissue abnormality appears stable, no  convincing active inflammation or abscess. IMPRESSION: No acute traumatic injury identified in the chest, abdomen, or pelvis. CHEST: 1. Chronic lung disease with left-side fibrothorax, chronic bilateral lung scarring and atelectasis. 2. Substantially regressed but not completely resolved peribronchial inflammatory changes in the right upper and middle lobe. And retained secretions in the right trachea and carina. Aspiration, mild infection difficult to exclude. ABDOMEN AND PELVIS: 1. Pronounced chronic posttraumatic deformity of the pelvis with stable chronic sacral decubitus wound since last year. No active inflammation is evident. 2. 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 3. Chronic suprapubic bladder catheter with no obstructive uropathy. 4. Aortic Atherosclerosis (ICD10-I70.0). Previous right internal iliac artery embolization. Electronically Signed   By: Odessa Fleming M.D.   On: 05/16/2023 05:42   DG Pelvis Portable  Result Date: 05/16/2023 CLINICAL DATA:  57 year old male status post fall. Chronically wheelchair-bound. EXAM: PORTABLE  PELVIS 1-2 VIEWS COMPARISON:  Pelvis radiographs 12/26/2022 and earlier. FINDINGS: Portable AP supine view at 0202 hours. Similar rotation to the right. Chronic left hemipelvis and proximal left femur ORIF with 4 cannulated screws, appear stable. Chronic pubic rami deformities associated and grossly stable. Chronic right iliac artery coil embolization. Femoral heads remain normally located. No acute osseous abnormality identified. Nonobstructed bowel-gas pattern. IMPRESSION: Stable pelvis with chronic posttraumatic, post ORIF, and post embolization changes. No acute osseous abnormality identified. Electronically Signed   By: Odessa Fleming M.D.   On: 05/16/2023 05:31   DG Chest Port 1 View  Result Date: 05/16/2023 CLINICAL DATA:  57 year old male status post fall. Chronically wheelchair-bound. EXAM: PORTABLE CHEST 1 VIEW COMPARISON:  CTA chest 10/14/2020 and earlier. FINDINGS: Portable AP supine view at 0159 hours. Lower lung volumes with chronic elevation of the right hemidiaphragm. Stable cardiac size and mediastinal contours. Patchy bilateral lung base opacity most resembles atelectasis. No pneumothorax or pleural effusion identified. No air bronchograms. No acute osseous abnormality identified. Negative visible bowel gas. IMPRESSION: 1. Low lung volumes with bilateral lung base opacity most resembling atelectasis. 2.  No acute traumatic injury identified. Electronically Signed   By: Odessa Fleming M.D.   On: 05/16/2023 05:29   CT HEAD WO CONTRAST  Result Date: 05/16/2023 CLINICAL DATA:  Fall EXAM: CT HEAD WITHOUT CONTRAST CT CERVICAL SPINE WITHOUT CONTRAST TECHNIQUE: Multidetector CT imaging of the head and cervical spine was performed following the standard protocol without intravenous contrast. Multiplanar CT image reconstructions of the cervical spine were also generated. RADIATION DOSE REDUCTION: This exam was performed according to the departmental dose-optimization program which includes automated exposure  control, adjustment of the mA and/or kV according to patient size and/or use of iterative reconstruction technique. COMPARISON:  None Available. FINDINGS: CT HEAD FINDINGS Brain: There is no mass, hemorrhage or extra-axial collection. The size and configuration of the ventricles and extra-axial CSF spaces are normal. The brain parenchyma is normal, without evidence of acute or chronic infarction. Vascular: No abnormal hyperdensity of the major intracranial arteries or dural venous sinuses. No intracranial atherosclerosis. Skull:  postsurgical changes of the left face. Sinuses/Orbits: No fluid levels or advanced mucosal thickening of the visualized paranasal sinuses. No mastoid or middle ear effusion. The orbits are normal. CT CERVICAL SPINE FINDINGS Alignment: No static subluxation. Facets are aligned. Occipital condyles are normally positioned. Skull base and vertebrae: No acute fracture. Soft tissues and spinal canal: No prevertebral fluid or swelling. No visible canal hematoma. Disc levels: No advanced spinal canal or neural foraminal stenosis. Upper chest: No pneumothorax, pulmonary nodule or pleural  effusion. Other: Normal visualized paraspinal cervical soft tissues. IMPRESSION: 1. No acute intracranial abnormality. 2. No acute fracture or static subluxation of the cervical spine. Electronically Signed   By: Deatra Robinson M.D.   On: 05/16/2023 04:12   CT CERVICAL SPINE WO CONTRAST  Result Date: 05/16/2023 CLINICAL DATA:  Fall EXAM: CT HEAD WITHOUT CONTRAST CT CERVICAL SPINE WITHOUT CONTRAST TECHNIQUE: Multidetector CT imaging of the head and cervical spine was performed following the standard protocol without intravenous contrast. Multiplanar CT image reconstructions of the cervical spine were also generated. RADIATION DOSE REDUCTION: This exam was performed according to the departmental dose-optimization program which includes automated exposure control, adjustment of the mA and/or kV according to patient  size and/or use of iterative reconstruction technique. COMPARISON:  None Available. FINDINGS: CT HEAD FINDINGS Brain: There is no mass, hemorrhage or extra-axial collection. The size and configuration of the ventricles and extra-axial CSF spaces are normal. The brain parenchyma is normal, without evidence of acute or chronic infarction. Vascular: No abnormal hyperdensity of the major intracranial arteries or dural venous sinuses. No intracranial atherosclerosis. Skull:  postsurgical changes of the left face. Sinuses/Orbits: No fluid levels or advanced mucosal thickening of the visualized paranasal sinuses. No mastoid or middle ear effusion. The orbits are normal. CT CERVICAL SPINE FINDINGS Alignment: No static subluxation. Facets are aligned. Occipital condyles are normally positioned. Skull base and vertebrae: No acute fracture. Soft tissues and spinal canal: No prevertebral fluid or swelling. No visible canal hematoma. Disc levels: No advanced spinal canal or neural foraminal stenosis. Upper chest: No pneumothorax, pulmonary nodule or pleural effusion. Other: Normal visualized paraspinal cervical soft tissues. IMPRESSION: 1. No acute intracranial abnormality. 2. No acute fracture or static subluxation of the cervical spine. Electronically Signed   By: Deatra Robinson M.D.   On: 05/16/2023 04:12    Pending Labs Unresulted Labs (From admission, onward)     Start     Ordered   05/17/23 0500  CBC  Tomorrow morning,   R        05/16/23 0754   05/17/23 0500  Basic metabolic panel  Tomorrow morning,   R        05/16/23 0754   05/16/23 1305  Blood gas, venous  Once,   R        05/16/23 1304   05/16/23 0458  Urine Culture (for pregnant, neutropenic or urologic patients or patients with an indwelling urinary catheter)  (Urine Labs)  Once,   URGENT       Question:  Indication  Answer:  Dysuria   05/16/23 0457            Vitals/Pain Today's Vitals   05/16/23 1515 05/16/23 1515 05/16/23 1525 05/16/23 1526   BP: (!) 128/96     Pulse: 85     Resp: 15     Temp:   97.7 F (36.5 C)   TempSrc:   Axillary   SpO2: 100% 100%    Weight:      Height:      PainSc:    0-No pain    Isolation Precautions No active isolations  Medications Medications  fentaNYL (DURAGESIC) 75 MCG/HR 1 patch (has no administration in time range)  enoxaparin (LOVENOX) injection 40 mg (0 mg Subcutaneous Hold 05/16/23 0943)  sodium chloride flush (NS) 0.9 % injection 3 mL (has no administration in time range)  acetaminophen (TYLENOL) tablet 650 mg (has no administration in time range)    Or  acetaminophen (TYLENOL) suppository 650  mg (has no administration in time range)  albuterol (PROVENTIL) (2.5 MG/3ML) 0.083% nebulizer solution 2.5 mg (has no administration in time range)  ondansetron (ZOFRAN) tablet 4 mg (has no administration in time range)    Or  ondansetron (ZOFRAN) injection 4 mg (has no administration in time range)  potassium chloride SA (KLOR-CON M) CR tablet 60 mEq (0 mEq Oral Hold 05/16/23 0942)  morphine (MSIR) tablet 15 mg (has no administration in time range)  ciprofloxacin (CIPRO) tablet 500 mg (has no administration in time range)  vancomycin (VANCOCIN) capsule 125 mg (has no administration in time range)  fentaNYL (SUBLIMAZE) injection 50 mcg (50 mcg Intravenous Given 05/16/23 0258)  Tdap (BOOSTRIX) injection 0.5 mL (0.5 mLs Intramuscular Given 05/16/23 0258)  lidocaine-EPINEPHrine (XYLOCAINE W/EPI) 2 %-1:200000 (PF) injection 20 mL (20 mLs Infiltration Given by Other 05/16/23 0258)  iohexol (OMNIPAQUE) 350 MG/ML injection 75 mL (75 mLs Intravenous Contrast Given 05/16/23 0244)  potassium chloride 10 mEq in 100 mL IVPB (0 mEq Intravenous Stopped 05/16/23 0943)  LORazepam (ATIVAN) injection 1 mg (1 mg Intravenous Given 05/16/23 0504)  cefTRIAXone (ROCEPHIN) 1 g in sodium chloride 0.9 % 100 mL IVPB (0 g Intravenous Stopped 05/16/23 0627)  LORazepam (ATIVAN) injection 1 mg (1 mg Intravenous Given 05/16/23  0547)    Mobility non-ambulatory     Focused Assessments Neuro Assessment Handoff:  Swallow screen pass? Yes  Cardiac Rhythm: Normal sinus rhythm       Neuro Assessment:   Neuro Checks:      Has TPA been given? No If patient is a Neuro Trauma and patient is going to OR before floor call report to 4N Charge nurse: [phone] or [phone]   R Recommendations: See Admitting Provider Note  Report given to:   Additional Notes:  Pt currently oriented x4.

## 2023-05-16 NOTE — ED Notes (Signed)
Pt seen sitting at the edge of the bed attempting to get up. Pulling at monitor lines, states he has anxiety and can't sit still. MD Rancour notified.

## 2023-05-16 NOTE — ED Provider Notes (Signed)
[place] [facility]GENC[facility] DEPARTMENT AT [facility] Provider Note   CSN: [id number] Arrival date & time: 05/16/23  0156     History  Chief Complaint  Patient presents with   Christopher [name] is a 57 y.o. male.  Patient wheelchair-bound from previous injury.  Comes in as a trauma patient after falling out of bed striking his head on a piece of furniture.  Sustained laceration to his left forehead.  Has had repetitive questioning and confusion since.  He normally can transfer self from chair to bed.  Sustained laceration today but no loss of consciousness.  No vomiting.  He believes he is on Eliquis but this is not on his medication reconciliation. Denied any other pain other than his chronic left hip pain.  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  Has wound to his left back that is packed.  He denies any fevers, chills, nausea, vomiting, chest pain or shortness of breath.  He remembers falling.  The history is provided by the patient and the EMS personnel. The history is limited by the condition of the patient.  Fall Pertinent negatives include no chest pain, no abdominal pain, no headaches and no shortness of breath.       Home Medications Prior to Admission medications   Medication Sig Start Date End Date Taking? Authorizing Provider  albuterol (VENTOLIN HFA) 108 (90 Base) MCG/ACT inhaler INHALE 2 PUFFS BY MOUTH EVERY 6 HOURS AS NEEDED FOR WHEEZE OR SHORTNESS OF BREATH 04/04/23   Ardith Dark, MD  Blood Pressure Monitoring (BLOOD PRESSURE CUFF) MISC Use daily as needed to check blood pressure. 11/05/19   Ardith Dark, MD  Budeson-Glycopyrrol-Formoterol (BREZTRI AEROSPHERE) 160-9-4.8 MCG/ACT AERO Inhale 2 puffs into the lungs in the morning and at bedtime. 09/06/22   Charlott Holler, MD  ciprofloxacin (CIPRO) 500 MG tablet Take 1 tablet (500 mg total) by mouth 2 (two) times daily. 10/27/22   Veryl Speak, FNP  dextromethorphan-guaiFENesin (MUCINEX  DM) 30-600 MG 12hr tablet Take 2 tablets by mouth 2 (two) times daily.    [provider]  famotidine (PEPCID) 40 MG tablet TAKE 1 TABLET BY MOUTH TWICE A DAY Patient taking differently: Take 40 mg by mouth at bedtime. 01/26/21   Lovorn, Aundra Millet, MD  fentaNYL (DURAGESIC) 75 MCG/HR Place 1 patch onto the skin every other day. ICD 10 code- G89.21- For chronic pain 05/03/23   Lovorn, Aundra Millet, MD  furosemide (LASIX) 40 MG tablet Take 1 tablet (40 mg total) by mouth daily. 04/04/23   Ardith Dark, MD  gabapentin (NEURONTIN) 300 MG capsule TAKE 1 CAPSULE BY MOUTH THREE TIMES A DAY 11/29/22   Lovorn, Megan, MD  GEMTESA 75 MG TABS Take 75 mg by mouth daily. 09/26/22   [provider]  hydrochlorothiazide (HYDRODIURIL) 12.5 MG tablet Take 1 tablet (12.5 mg total) by mouth daily. 10/05/22   Ardith Dark, MD  imipramine (TOFRANIL) 25 MG tablet Take 1 tablet (25 mg total) by mouth in the morning, at noon, and at bedtime. For bladder spasms 02/06/23   Lovorn, Aundra Millet, MD  LINZESS 72 MCG capsule TAKE 2 CAPSULES (145 MCG TOTAL) BY MOUTH DAILY BEFORE BREAKFAST. 10/19/22   Ardith Dark, MD  LORazepam (ATIVAN) 0.5 MG tablet Take 0.5 mg by mouth 3 (three) times daily. 03/25/22   [provider]  melatonin ([employer] MELATONIN) 3 MG TABS tablet Take 1 tablet (3 mg total) by mouth at bedtime. 11/01/19  Matcha, Anupama, MD  morphine (MSIR) 15 MG tablet Take 1 tablet (15 mg total) by mouth every 3 (three) hours as needed for severe pain (pain score 7-10). 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 03/13/19   Esperanza Sheets, MD  naloxone [facility]) nasal spray 4 mg/0.1 mL To use if pt develops unconsciousness or confusion that family thinks is related to opioids. 02/06/23   Lovorn, Aundra Millet, MD  OLANZapine (ZYPREXA) 5 MG tablet Take 2.5-10 mg by mouth See admin instructions. 2.5 mgh in the morning, and midday, and  10 mg at bedtime 06/20/22   [provider]  oxybutynin (DITROPAN) 5 MG tablet Take 5 mg by mouth See admin instructions. Take 5 mg by mouth three times a day and an additional 5 mg once daily as needed for urinary urgency 11/13/19   [provider]  pantoprazole (PROTONIX) 40 MG tablet TAKE 1 TABLET BY MOUTH EVERY DAY Patient taking differently: Take 40 mg by mouth daily with breakfast. 12/06/22   Meredith Pel, NP  PARoxetine (PAXIL) 40 MG tablet TAKE 1 TABLET BY MOUTH EVERYDAY AT BEDTIME 12/26/22   Lovorn, Aundra Millet, MD  Probiotic Product (PROBIOTIC COLON SUPPORT) CAPS Take 1 capsule by mouth daily.    [provider]  promethazine (PHENERGAN) 12.5 MG tablet TAKE 1 TABLET (12.5 MG TOTAL) BY MOUTH EVERY 6 (SIX) HOURS AS NEEDED FOR NAUSEA, VOMITING OR REFRACTORY NAUSEA / VOMITING. 04/26/23   Lovorn, Aundra Millet, MD  senna-docusate (SENOKOT-S) 8.6-50 MG tablet Take 2 tablets by mouth 3 (three) times daily. 04/13/20   [provider]  Spacer/Aero-Holding Chambers (AEROCHAMBER MV) inhaler Use as instructed with MDI 04/14/23   Charlott Holler, MD  vancomycin (VANCOCIN) 125 MG capsule Take 1 capsule (125 mg total) by mouth in the morning and at bedtime. 10/27/22   Veryl Speak, FNP      Allergies    Methadone    Review of Systems   Review of Systems  Constitutional:  Positive for activity change. Negative for appetite change and fever.  HENT:  Negative for congestion.   Respiratory:  Negative for cough, chest tightness and shortness of breath.   Cardiovascular:  Negative for chest pain.  Gastrointestinal:  Negative for abdominal pain, nausea and vomiting.  Genitourinary:  Negative for dysuria and hematuria.  Musculoskeletal:  Positive for arthralgias and myalgias.  Skin:  Negative for rash.  Neurological:  Negative for dizziness, weakness and headaches.   all other systems are negative except as noted in the HPI and PMH.    Physical Exam Updated Vital Signs BP (!)  139/90   Pulse (!) 103   Temp (!) 97.5 F (36.4 C)   Ht 6\' 3"  (1.905 m)   Wt 52.2 kg   SpO2 99%   BMI 14.37 kg/m  Physical Exam Vitals and nursing note reviewed.  Constitutional:      General: He is not in acute distress.    Appearance: He is well-developed.  HENT:     Head: Normocephalic.     Comments: 2 cm laceration left forehead, hemostat    Mouth/Throat:     Pharynx: No oropharyngeal exudate.  Eyes:     Conjunctiva/sclera: Conjunctivae normal.     Pupils: Pupils are equal, round, and reactive to light.  Neck:     Comments: No meningismus. Cardiovascular:     Rate and Rhythm: Normal rate and regular rhythm.  Heart sounds: Normal heart sounds. No murmur heard. Pulmonary:     Effort: Pulmonary effort is normal. No respiratory distress.     Breath sounds: Normal breath sounds.  Abdominal:     Palpations: Abdomen is soft.     Tenderness: There is no abdominal tenderness. There is no guarding or rebound.     Comments: Suprapubic catheter in place  Musculoskeletal:        General: No tenderness. Normal range of motion.     Cervical back: Normal range of motion and neck supple.     Comments: Chronic surgical changes of left hip with skin grafts.  Packed wound to left lower back without surrounding erythema or drainage.  Skin:    General: Skin is warm.  Neurological:     Mental Status: He is alert and oriented to person, place, and time.     Cranial Nerves: No cranial nerve deficit.     Motor: No abnormal muscle tone.     Coordination: Coordination normal.     Comments: Lower extremity weakness at baseline.  Psychiatric:        Behavior: Behavior normal.     ED Results / Procedures / Treatments   Labs (all labs ordered are listed, but only abnormal results are displayed) Labs Reviewed  COMPREHENSIVE METABOLIC PANEL - Abnormal; Notable for the following components:      Result Value   Potassium 2.7 (*)    Chloride 96 (*)    Glucose, Bld 115 (*)    All other  components within normal limits  CBC - Abnormal; Notable for the following components:   WBC 10.6 (*)    All other components within normal limits  URINALYSIS, ROUTINE W REFLEX MICROSCOPIC - Abnormal; Notable for the following components:   APPearance HAZY (*)    Specific Gravity, Urine >1.046 (*)    Hgb urine dipstick SMALL (*)    Protein, ur 30 (*)    Nitrite POSITIVE (*)    Leukocytes,Ua LARGE (*)    Bacteria, UA MANY (*)    All other components within normal limits  I-STAT CHEM 8, ED - Abnormal; Notable for the following components:   Potassium 2.7 (*)    Chloride 94 (*)    Glucose, Bld 117 (*)    All other components within normal limits  URINE CULTURE  ETHANOL  PROTIME-INR  MAGNESIUM  HIV ANTIBODY (ROUTINE TESTING W REFLEX)  I-STAT CG4 LACTIC ACID, ED  SAMPLE TO BLOOD BANK    EKG None  Radiology CT CHEST ABDOMEN PELVIS W CONTRAST  Result Date: 05/16/2023 CLINICAL DATA:  57 year old male status post fall. Chronically wheelchair-bound. EXAM: CT CHEST, ABDOMEN, AND PELVIS WITH CONTRAST TECHNIQUE: Multidetector CT imaging of the chest, abdomen and pelvis was performed following the standard protocol during bolus administration of intravenous contrast. RADIATION DOSE REDUCTION: This exam was performed according to the departmental dose-optimization program which includes automated exposure control, adjustment of the mA and/or kV according to patient size and/or use of iterative reconstruction technique. CONTRAST:  75mL OMNIPAQUE IOHEXOL 350 MG/ML SOLN COMPARISON:  CTA chest 10/14/2020. CT Abdomen and Pelvis 03/06/2022. FINDINGS: CT CHEST FINDINGS Cardiovascular: No cardiomegaly. Major mediastinal vascular structures appear intact. Mild for age calcified atherosclerosis in the chest. Mediastinum/Nodes: Stable, no no mediastinal mass, lymphadenopathy, hematoma. Lungs/Pleura: Chronic and unresolved right lower lobe atelectasis or consolidation, stable from the CT Abdomen and Pelvis  last year. Superimposed contralateral left lower lung fibrothorax with chronic pleural thickening, pleural calcification, adjacent left lower lobe atelectasis  or scarring. Superimposed anterior left lung pleural thickening and calcification also. All stable from last year. Mild superimposed respiratory motion today. Major airways are patent although with increased retained secretions in the right trachea and carina on series 5, image 55. Substantially regressed but not completely resolved right upper lobe and middle lobe peribronchial opacity since 2022. Stable mild apical lung scarring. Musculoskeletal: Stable.  No acute osseous abnormality identified. CT ABDOMEN PELVIS FINDINGS Hepatobiliary: Liver and gallbladder appear stable and intact. No perihepatic fluid. Pancreas: Stable chronic pancreatic atrophy. Spleen: Stable and intact.  No perisplenic fluid. Adrenals/Urinary Tract: Small chronic right adrenal adenoma is stable since 2020 (no follow-up imaging recommended). Negative left adrenal gland. Nonobstructed kidneys appear intact. Symmetric renal enhancement and early contrast excretion. Decompressed ureters. Chronic suprapubic bladder catheter appears stable since last year. Bladder is decompressed. 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 Decompressed blind-ending distal sigmoid and rectum. No dilated small bowel. No free air or free fluid. No active bowel inflammation identified. Vascular/Lymphatic: Aortoiliac calcified atherosclerosis. Normal caliber abdominal aorta. Chronic right internal iliac artery embolization. Otherwise major arterial structures grossly patent. Portal venous system grossly patent. No lymphadenopathy identified. Reproductive: Negative. Other: No pelvis free fluid. 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 Chronic vacuum disc there. Pronounced chronic left hemipelvic deformity,  left hemipelvis and proximal left femur ORIF. Chronic pubic rami fractures. Bone detail in the pelvis and proximal femurs is degraded by motion artifact. Chronic heterotopic calcification or ossification along the chronically fractured left SI joint. Chronic soft tissue deficiency overlying the sacrum and coccyx, but stable compared to last year. No acute osseous abnormality identified. Chronic decubitus soft tissue abnormality appears stable, no convincing active inflammation or abscess. IMPRESSION: No acute traumatic injury identified in the chest, abdomen, or pelvis. CHEST: 1. Chronic lung disease with left-side fibrothorax, chronic bilateral lung scarring and atelectasis. 2. Substantially regressed but not completely resolved peribronchial inflammatory changes in the right upper and middle lobe. And retained secretions in the right trachea and carina. Aspiration, mild infection difficult to exclude. ABDOMEN AND PELVIS: 1. Pronounced chronic posttraumatic deformity of the pelvis with stable chronic sacral decubitus wound since last year. No active inflammation is evident. 2. 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 3. Chronic suprapubic bladder catheter with no obstructive uropathy. 4. Aortic Atherosclerosis (ICD10-I70.0). Previous right internal iliac artery embolization. Electronically Signed   By: Odessa Fleming M.D.   On: 05/16/2023 05:42   DG Pelvis Portable  Result Date: 05/16/2023 CLINICAL DATA:  57 year old male status post fall. Chronically wheelchair-bound. EXAM: PORTABLE PELVIS 1-2 VIEWS COMPARISON:  Pelvis radiographs 12/26/2022 and earlier. FINDINGS: Portable AP supine view at 0202 hours. Similar rotation to the right. Chronic left hemipelvis and proximal left femur ORIF with 4 cannulated screws, appear stable. Chronic pubic rami deformities associated and grossly stable. Chronic right iliac artery coil embolization. Femoral heads remain normally located. No acute  osseous abnormality identified. Nonobstructed bowel-gas pattern. IMPRESSION: Stable pelvis with chronic posttraumatic, post ORIF, and post embolization changes. No acute osseous abnormality identified. Electronically Signed   By: Odessa Fleming M.D.   On: 05/16/2023 05:31   DG Chest Port 1 View  Result Date: 05/16/2023 CLINICAL DATA:  57 year old male status post fall. Chronically wheelchair-bound. EXAM: PORTABLE CHEST 1 VIEW COMPARISON:  CTA chest 10/14/2020 and earlier. FINDINGS: Portable AP supine view at 0159 hours. Lower lung volumes with chronic elevation of the right hemidiaphragm. Stable cardiac  size and mediastinal contours. Patchy bilateral lung base opacity most resembles atelectasis. No pneumothorax or pleural effusion identified. No air bronchograms. No acute osseous abnormality identified. Negative visible bowel gas. IMPRESSION: 1. Low lung volumes with bilateral lung base opacity most resembling atelectasis. 2.  No acute traumatic injury identified. Electronically Signed   By: Odessa Fleming M.D.   On: 05/16/2023 05:29   CT HEAD WO CONTRAST  Result Date: 05/16/2023 CLINICAL DATA:  Fall EXAM: CT HEAD WITHOUT CONTRAST CT CERVICAL SPINE WITHOUT CONTRAST TECHNIQUE: Multidetector CT imaging of the head and cervical spine was performed following the standard protocol without intravenous contrast. Multiplanar CT image reconstructions of the cervical spine were also generated. RADIATION DOSE REDUCTION: This exam was performed according to the departmental dose-optimization program which includes automated exposure control, adjustment of the mA and/or kV according to patient size and/or use of iterative reconstruction technique. COMPARISON:  None Available. FINDINGS: CT HEAD FINDINGS Brain: There is no mass, hemorrhage or extra-axial collection. The size and configuration of the ventricles and extra-axial CSF spaces are normal. The brain parenchyma is normal, without evidence of acute or chronic infarction.  Vascular: No abnormal hyperdensity of the major intracranial arteries or dural venous sinuses. No intracranial atherosclerosis. Skull:  postsurgical changes of the left face. Sinuses/Orbits: No fluid levels or advanced mucosal thickening of the visualized paranasal sinuses. No mastoid or middle ear effusion. The orbits are normal. CT CERVICAL SPINE FINDINGS Alignment: No static subluxation. Facets are aligned. Occipital condyles are normally positioned. Skull base and vertebrae: No acute fracture. Soft tissues and spinal canal: No prevertebral fluid or swelling. No visible canal hematoma. Disc levels: No advanced spinal canal or neural foraminal stenosis. Upper chest: No pneumothorax, pulmonary nodule or pleural effusion. Other: Normal visualized paraspinal cervical soft tissues. IMPRESSION: 1. No acute intracranial abnormality. 2. No acute fracture or static subluxation of the cervical spine. Electronically Signed   By: Deatra Robinson M.D.   On: 05/16/2023 04:12   CT CERVICAL SPINE WO CONTRAST  Result Date: 05/16/2023 CLINICAL DATA:  Fall EXAM: CT HEAD WITHOUT CONTRAST CT CERVICAL SPINE WITHOUT CONTRAST TECHNIQUE: Multidetector CT imaging of the head and cervical spine was performed following the standard protocol without intravenous contrast. Multiplanar CT image reconstructions of the cervical spine were also generated. RADIATION DOSE REDUCTION: This exam was performed according to the departmental dose-optimization program which includes automated exposure control, adjustment of the mA and/or kV according to patient size and/or use of iterative reconstruction technique. COMPARISON:  None Available. FINDINGS: CT HEAD FINDINGS Brain: There is no mass, hemorrhage or extra-axial collection. The size and configuration of the ventricles and extra-axial CSF spaces are normal. The brain parenchyma is normal, without evidence of acute or chronic infarction. Vascular: No abnormal hyperdensity of the major intracranial  arteries or dural venous sinuses. No intracranial atherosclerosis. Skull:  postsurgical changes of the left face. Sinuses/Orbits: No fluid levels or advanced mucosal thickening of the visualized paranasal sinuses. No mastoid or middle ear effusion. The orbits are normal. CT CERVICAL SPINE FINDINGS Alignment: No static subluxation. Facets are aligned. Occipital condyles are normally positioned. Skull base and vertebrae: No acute fracture. Soft tissues and spinal canal: No prevertebral fluid or swelling. No visible canal hematoma. Disc levels: No advanced spinal canal or neural foraminal stenosis. Upper chest: No pneumothorax, pulmonary nodule or pleural effusion. Other: Normal visualized paraspinal cervical soft tissues. IMPRESSION: 1. No acute intracranial abnormality. 2. No acute fracture or static subluxation of the cervical spine. Electronically Signed   By:  Deatra Robinson M.D.   On: 05/16/2023 04:12    Procedures Procedures    Medications Ordered in ED Medications  fentaNYL (SUBLIMAZE) injection 50 mcg (has no administration in time range)  Tdap (BOOSTRIX) injection 0.5 mL (has no administration in time range)  lidocaine-EPINEPHrine (XYLOCAINE W/EPI) 2 %-1:200000 (PF) injection 20 mL (has no administration in time range)    ED Course/ Medical Decision Making/ A&P                                 Medical Decision Making Amount and/or Complexity of Data Reviewed Independent Historian: EMS Labs: ordered. Decision-making details documented in ED Course. Radiology: ordered and independent interpretation performed. Decision-making details documented in ED Course. ECG/medicine tests: ordered and independent interpretation performed. Decision-making details documented in ED Course.  Risk Prescription drug management. Decision regarding hospitalization.   Patient from home after falling from bed striking his head.  Questionably takes Eliquis that is not on his medication list.  He was peer  somewhat confused.  Does recall falling.  Sustained laceration to his forehead.  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  Has suprapubic catheter and colostomy.  Vital signs are stable.  GCS is 14.  Patient taken to CT scan for imaging of his head and C-spine. CT head and C-spine negative for acute traumatic injury.  Chest x-ray with atelectasis.  Pelvic x-ray with stable postsurgical changes.  Results reviewed interpreted by me.  Patient becoming agitated and trying to climb out of bed.  Denies being in pain.  He is seems to be getting more confused.  Workup broadened to evaluate for source of encephalopathy.  Consider UTI given his chronic catheter.  Chest x-ray negative for pneumonia.  Labs show hypokalemia 2.7 which is aggressively replaced.  Urinalysis appears dirty but this is a catheterized sample.  Empiric antibiotics given given his encephalopathy. Has been sensitive to ceftriaxone in past   CT chest abdomen pelvis shows chronic posttraumatic changes as well as chronic lung disease.  Patient tachycardic.  Given additional IV fluids and IV antibiotics.  Will treat as possible UTI.  Some of his encephalopathy may be concussion from his head injury.  Admission discussed with Dr. Margo Aye       Final Clinical Impression(s) / ED Diagnoses Final diagnoses:  Acute encephalopathy  Fall, initial encounter  Laceration of scalp without foreign body, initial encounter    Rx / DC Orders ED Discharge Orders     None         Necha Harries, Jeannett Senior, MD 05/16/23 [phone]

## 2023-05-16 NOTE — Telephone Encounter (Signed)
Please contact Patient or Clydie Braun with [facility] re: message from Provider.

## 2023-05-16 NOTE — Progress Notes (Signed)
   05/16/23 0215  Spiritual Encounters  Type of Visit Initial  Care provided to: Pt not available  Referral source Trauma page  Reason for visit Trauma  OnCall Visit No   Chaplain responded to a level two trauma. The patient, Christopher [name] was attended to by the medicalt eam. No family is present. If a chaplain is requested someone will respond.   Valerie Roys [facility] [phone]

## 2023-05-16 NOTE — Progress Notes (Signed)
Transition of Care [facility]) - CAGE-AID Screening   Patient Details  Name: Christopher [name] MRN: [id number] Date of Birth: 13-Apr-1966  Transition of Care [facility]) CM/SW Contact:    Katha Hamming, RN Phone Number: 05/16/2023, 6:43 AM   CAGE-AID Screening:    Have You Ever Felt You Ought to Cut Down on Your Drinking or Drug Use?: No Have People Annoyed You By Critizing Your Drinking Or Drug Use?: No Have You Felt Bad Or Guilty About Your Drinking Or Drug Use?: No Have You Ever Had a Drink or Used Drugs First Thing In The Morning to Steady Your Nerves or to Get Rid of a Hangover?: No CAGE-AID Score: 0  Substance Abuse Education Offered: No

## 2023-05-16 NOTE — ED Notes (Signed)
Pt is sleeping.  Bed alarm is in place and connected
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
## 2023-05-16 NOTE — ED Notes (Signed)
Pt trying to get out of bed, legs hanging over side of bed. Pt a;so pulling at Monitor lines and refusing to keep them on. Bed alarm applied.

## 2023-05-16 NOTE — ED Notes (Signed)
Pt has attempted to get out of bed multiple times in the last 10-15 minutes. After applying bed alarm and repositioning the bed in trendelenburg, the pt has still managed to get all the way to the edge of the bottom of the bed.

## 2023-05-16 NOTE — ED Triage Notes (Signed)
Pt came from home for an unwitnessed fall. Wife noticed patient confused leading up to fall. EMS noted confusion with repetitive questions. Pt wheelchair bound for the past five years, normally can transfer self from chair to bed.  EMS VS 122/80 110 HR 94% 4L [place] 127 CBG

## 2023-05-16 NOTE — ED Notes (Signed)
Patient transported to CT 

## 2023-05-17 DIAGNOSIS — G9341 Metabolic encephalopathy: Secondary | ICD-10-CM | POA: Diagnosis not present

## 2023-05-17 LAB — CBC
HCT: 36.4 % — ABNORMAL LOW (ref 39.0–52.0)
Hemoglobin: 11.6 g/dL — ABNORMAL LOW (ref 13.0–17.0)
MCH: 29.9 pg (ref 26.0–34.0)
MCHC: 31.9 g/dL (ref 30.0–36.0)
MCV: 93.8 fL (ref 80.0–100.0)
Platelets: 196 10*3/uL (ref 150–400)
RBC: 3.88 MIL/uL — ABNORMAL LOW (ref 4.22–5.81)
RDW: 13.1 % (ref 11.5–15.5)
WBC: 5.8 10*3/uL (ref 4.0–10.5)
nRBC: 0 % (ref 0.0–0.2)

## 2023-05-17 LAB — BASIC METABOLIC PANEL
Anion gap: 6 (ref 5–15)
BUN: 9 mg/dL (ref 6–20)
CO2: 29 mmol/L (ref 22–32)
Calcium: 9 mg/dL (ref 8.9–10.3)
Chloride: 101 mmol/L (ref 98–111)
Creatinine, Ser: 0.88 mg/dL (ref 0.61–1.24)
GFR, Estimated: >60 mL/min (ref >60)
Glucose, Bld: 132 mg/dL — ABNORMAL HIGH (ref 70–99)
Potassium: 2.5 mmol/L — CL (ref 3.5–5.1)
Sodium: 136 mmol/L (ref 135–145)

## 2023-05-17 LAB — POTASSIUM: Potassium: 3 mmol/L — ABNORMAL LOW (ref 3.5–5.1)

## 2023-05-17 MED ORDER — POTASSIUM CHLORIDE CRYS ER 20 MEQ PO TBCR
40.0000 meq | EXTENDED_RELEASE_TABLET | ORAL | Status: AC
  Administered 2023-05-17 (×2): 40 meq via ORAL
  Filled 2023-05-17 (×2): qty 2

## 2023-05-17 MED ORDER — POTASSIUM CHLORIDE 10 MEQ/100ML IV SOLN
10.0000 meq | INTRAVENOUS | Status: AC
  Administered 2023-05-17 (×3): 10 meq via INTRAVENOUS
  Filled 2023-05-17 (×3): qty 100
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
## 2023-05-17 NOTE — Progress Notes (Signed)
   05/17/23 1012  SDOH FOOD SCREENING (Complete 2 screening rows OR apply appropriate MACRO for Patient declined or Patient unable to answer)  Within the past 12 months, you worried that your food would run out before you got the money to buy more. Never true  Within the past 12 months, the food you bought just didn't last and you didn't have money to get more. Never true  SDOH Interventions  Food Insecurity Interventions Intervention Not Indicated;Inpatient TOC

## 2023-05-17 NOTE — Plan of Care (Signed)
  Problem: Education: Goal: Knowledge of General Education information will improve Description: Including pain rating scale, medication(s)/side effects and non-pharmacologic comfort measures Outcome: Progressing   Problem: Pain Management: Goal: General experience of comfort will improve Outcome: Progressing
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
## 2023-05-18 ENCOUNTER — Other Ambulatory Visit (HOSPITAL_COMMUNITY): Payer: Self-pay

## 2023-05-18 DIAGNOSIS — G9341 Metabolic encephalopathy: Secondary | ICD-10-CM | POA: Diagnosis not present

## 2023-05-18 LAB — URINE CULTURE: Culture: >=100000 — AB

## 2023-05-18 LAB — BASIC METABOLIC PANEL
Anion gap: 7 (ref 5–15)
BUN: 8 mg/dL (ref 6–20)
CO2: 25 mmol/L (ref 22–32)
Calcium: 8.7 mg/dL — ABNORMAL LOW (ref 8.9–10.3)
Chloride: 104 mmol/L (ref 98–111)
Creatinine, Ser: 0.72 mg/dL (ref 0.61–1.24)
GFR, Estimated: >60 mL/min (ref >60)
Glucose, Bld: 82 mg/dL (ref 70–99)
Potassium: 4 mmol/L (ref 3.5–5.1)
Sodium: 136 mmol/L (ref 135–145)

## 2023-05-18 MED ORDER — CEPHALEXIN 500 MG PO CAPS
500.0000 mg | ORAL_CAPSULE | Freq: Three times a day (TID) | ORAL | 0 refills | Status: AC
  Filled 2023-05-18: qty 12, 4d supply, fill #0

## 2023-05-18 MED ORDER — CEPHALEXIN 500 MG PO CAPS: 500.0000 mg | ORAL_CAPSULE | Freq: Three times a day (TID) | ORAL | Status: DC

## 2023-05-18 NOTE — Plan of Care (Signed)
  Problem: Education: Goal: Knowledge of General Education information will improve Description: Including pain rating scale, medication(s)/side effects and non-pharmacologic comfort measures Outcome: Progressing   Problem: Health Behavior/Discharge Planning: Goal: Ability to manage health-related needs will improve Outcome: Progressing   Problem: Activity: Goal: Risk for activity intolerance will decrease Outcome: Progressing   Problem: Pain Management: Goal: General experience of comfort will improve Outcome: Progressing
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
## 2023-05-18 NOTE — Progress Notes (Signed)
Patient vitals WNL for him. He denies c/o pain or other at this time. He has met goals for discharge. TOC arranged transportation to home with [facility] and he is currently awaiting arrival. His wife is at home for his arrival. [place], [place] SWAT Team reviewed D/c instructions and answered questions for patient.

## 2023-05-18 NOTE — Progress Notes (Signed)
PT Cancellation Note  Patient Details Name: Christopher [name] MRN: [id number] DOB: 01-14-1966   Cancelled Treatment:    Reason Eval/Treat Not Completed: Patient declined, stating he did not sleep well and just wants to rest. Acute PT to follow as able.  Hilton Cork, PT, DPT Secure Chat Preferred  Rehab Office [phone]   Arturo Morton Brion Aliment 05/18/2023, 11:33 AM

## 2023-05-18 NOTE — TOC Progression Note (Signed)
Transition of Care (TOC) - Progression Note   Patient discharged . Requesting ambulance transportation home. States wife is at home and will be there when he arrives.   Confirmed address.   [facility] paperwork completed and on chart. Called [facility] , they will be here "in a few minutes". Nurse aware. Patient aware. NCM offered to call his wife. Patient stated he will call his wife  [name] with [facility] aware of discharge.  Patient Details  Name: Christopher [name] MRN: [id number] Date of Birth: 1965-11-07  Transition of Care [facility]) CM/SW Contact  Thadd Apuzzo, Adria Devon, RN Phone Number: 05/18/2023, 12:49 PM  Clinical Narrative:       Expected Discharge Plan: Home w Home Health Services Barriers to Discharge: Continued Medical Work up  Expected Discharge Plan and Services   Discharge Planning Services: CM Consult Post Acute Care Choice: Home Health Living arrangements for the past 2 months: Single Family Home Expected Discharge Date: 05/18/23               DME Arranged: N/A DME Agency: NA         HH Agency:  (see note)         Social Determinants of Health (SDOH) Interventions SDOH Screenings   Food Insecurity: No Food Insecurity (05/17/2023)  Recent Concern: Food Insecurity - Food Insecurity Present (05/16/2023)  Housing: Low Risk  (05/16/2023)  Transportation Needs: No Transportation Needs (05/16/2023)  Utilities: Not At Risk (05/16/2023)  Depression (PHQ2-9): High Risk (05/04/2023)  Financial Resource Strain: Low Risk  (03/01/2019)  Physical Activity: Inactive (03/01/2019)  Stress: Stress Concern Present (03/01/2019)  Tobacco Use: Medium Risk (05/16/2023)    Readmission Risk Interventions     No data to display

## 2023-05-19 ENCOUNTER — Telehealth: Payer: Self-pay

## 2023-05-19 ENCOUNTER — Telehealth: Payer: Self-pay | Admitting: *Deleted

## 2023-05-19 NOTE — Transitions of Care (Post Inpatient/ED Visit) (Signed)
05/19/2023  Name: Christopher [name] MRN: [id number] DOB: 07-16-1965  Today's TOC FU Call Status: Today's TOC FU Call Status:: Successful TOC FU Call Completed TOC FU Call Complete Date: 05/19/23 Patient's Name and Date of Birth confirmed.  Transition Care Management Follow-up Telephone Call Date of Discharge: 05/18/23 Discharge Facility: [facility]) Type of Discharge: Inpatient Admission Primary Inpatient Discharge Diagnosis:: encephalopathy How have you been since you were released from the hospital?: Better Any questions or concerns?: No  Items Reviewed: Did you receive and understand the discharge instructions provided?: Yes Medications obtained,verified, and reconciled?: Yes (Medications Reviewed) Any new allergies since your discharge?: No Dietary orders reviewed?: Yes Do you have support at home?: Yes People in Home: spouse  Medications Reviewed Today: Medications Reviewed Today     Reviewed by Karena Addison, LPN (Licensed Practical Nurse) on 05/19/23 at 1057  Med List Status: <None>   Medication Order Taking? Sig Documenting Provider Last Dose Status Informant  albuterol (VENTOLIN HFA) 108 (90 Base) MCG/ACT inhaler [id number] No INHALE 2 PUFFS BY MOUTH EVERY 6 HOURS AS NEEDED FOR WHEEZE OR SHORTNESS OF BREATH Ardith Dark, MD 05/15/2023 Active Spouse/Significant Other  Budeson-Glycopyrrol-Formoterol (BREZTRI AEROSPHERE) 160-9-4.8 MCG/ACT AERO [id number] No Inhale 2 puffs into the lungs in the morning and at bedtime. Charlott Holler, MD 05/15/2023 Active Spouse/Significant Other  cephALEXin (KEFLEX) 500 MG capsule [id number]  Take 1 capsule (500 mg total) by mouth every 8 (eight) hours for 4 days. Burnadette Pop, MD  Active   ciprofloxacin (CIPRO) 500 MG tablet [id number] No Take 1 tablet (500 mg total) by mouth 2 (two) times daily. Veryl Speak, FNP 05/15/2023 Active Spouse/Significant Other  dextromethorphan-guaiFENesin (MUCINEX DM) 30-600 MG 12hr tablet  [id number] No Take 2 tablets by mouth 2 (two) times daily. [provider] 05/15/2023 Active Spouse/Significant Other  famotidine (PEPCID) 40 MG tablet [id number] No TAKE 1 TABLET BY MOUTH TWICE A DAY  Patient taking differently: Take 40 mg by mouth at bedtime.   Genice Rouge, MD 05/15/2023 Active Spouse/Significant Other  fentaNYL (DURAGESIC) 75 MCG/HR [id number] No Place 1 patch onto the skin every other day. ICD 10 code- G89.21- For chronic pain Lovorn, Megan, MD Past Week Active Spouse/Significant Other  furosemide (LASIX) 40 MG tablet [id number] No Take 1 tablet (40 mg total) by mouth daily. Ardith Dark, MD 05/15/2023 Active Spouse/Significant Other  gabapentin (NEURONTIN) 300 MG capsule [id number] No TAKE 1 CAPSULE BY MOUTH THREE TIMES A DAY Lovorn, Megan, MD 05/15/2023 Active Spouse/Significant Other  GEMTESA 75 MG TABS [id number] No Take 75 mg by mouth daily. [provider] 05/15/2023 Active Spouse/Significant Other  hydrochlorothiazide (HYDRODIURIL) 12.5 MG tablet [id number] No Take 1 tablet (12.5 mg total) by mouth daily. Ardith Dark, MD 05/15/2023 Active Spouse/Significant Other  imipramine (TOFRANIL) 25 MG tablet [id number] No Take 1 tablet (25 mg total) by mouth in the morning, at noon, and at bedtime. For bladder spasms Genice Rouge, MD 05/15/2023 Active Spouse/Significant Other  LINZESS 72 MCG capsule [id number] No TAKE 2 CAPSULES (145 MCG TOTAL) BY MOUTH DAILY BEFORE BREAKFAST. Ardith Dark, MD 05/15/2023 Active Spouse/Significant Other  LORazepam (ATIVAN) 0.5 MG tablet [id number] No Take 0.5 mg by mouth 3 (three) times daily. [provider] 05/15/2023 Active Spouse/Significant Other           Med Note Gunnar Fusi, MELISSA R   Mon Oct 31, 2022  4:21 PM)    melatonin ([employer] MELATONIN) 3 MG TABS tablet [id number] No Take 1 tablet (3 mg total)  by mouth at bedtime. Vonzella Nipple, MD 05/15/2023 Active Spouse/Significant Other  morphine (MSIR) 15 MG tablet [id number] No Take  1 tablet (15 mg total) by mouth every 3 (three) hours as needed for severe pain (pain score 7-10). 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 Esperanza Sheets, MD 05/15/2023 Active Spouse/Significant Other  naloxone (NARCAN) nasal spray 4 mg/0.1 mL [id number] No To use if pt develops unconsciousness or confusion that family thinks is related to opioids. Lovorn, Aundra Millet, MD unk Active Spouse/Significant Other  OLANZapine (ZYPREXA) 5 MG tablet [id number] No Take 2.5-10 mg by mouth See admin instructions. 2.5 mgh in the morning, and midday, and 10 mg at bedtime [provider] 05/15/2023 Active Spouse/Significant Other  oxybutynin (DITROPAN) 5 MG tablet [id number] No Take 5 mg by mouth See admin instructions. Take 5 mg by mouth three times a day and an additional 5 mg once daily as needed for urinary urgency [provider] 05/15/2023 Active Spouse/Significant Other  pantoprazole (PROTONIX) 40 MG tablet [id number] No TAKE 1 TABLET BY MOUTH EVERY DAY  Patient taking differently: Take 40 mg by mouth daily with breakfast.   Meredith Pel, NP 05/15/2023 Active Spouse/Significant Other  PARoxetine (PAXIL) 40 MG tablet [id number] No TAKE 1 TABLET BY MOUTH EVERYDAY AT BEDTIME Genice Rouge, MD 05/15/2023 Active Spouse/Significant Other  Probiotic Product (PROBIOTIC COLON SUPPORT) CAPS [id number] No Take 1 capsule by mouth daily. [provider] 05/15/2023 Active Spouse/Significant Other  promethazine (PHENERGAN) 12.5 MG tablet [id number] No TAKE 1 TABLET (12.5 MG TOTAL) BY MOUTH EVERY 6 (SIX) HOURS AS NEEDED FOR NAUSEA, VOMITING OR REFRACTORY NAUSEA / VOMITING. Genice Rouge, MD 05/15/2023 Active Spouse/Significant Other  senna-docusate (SENOKOT-S) 8.6-50 MG tablet [id number] No Take 2 tablets by mouth 3 (three) times daily. [provider] 05/15/2023  Active Spouse/Significant Other  Spacer/Aero-Holding Chambers (AEROCHAMBER MV) inhaler [id number] No Use as instructed with MDI Charlott Holler, MD Past Week Active Spouse/Significant Other  vancomycin (VANCOCIN) 125 MG capsule [id number] No Take 1 capsule (125 mg total) by mouth in the morning and at bedtime. Veryl Speak, FNP 05/15/2023 Active Spouse/Significant Other            Home Care and Equipment/Supplies: Were Home Health Services Ordered?: NA Any new equipment or medical supplies ordered?: NA  Functional Questionnaire: Do you need assistance with bathing/showering or dressing?: Yes Do you need assistance with meal preparation?: Yes Do you need assistance with eating?: No Do you have difficulty maintaining continence: Yes Do you need assistance with getting out of bed/getting out of a chair/moving?: Yes Do you have difficulty managing or taking your medications?: Yes  Follow up appointments reviewed: PCP Follow-up appointment confirmed?: No (declined) MD Provider Line [phone] Given: No Specialist Hospital Follow-up appointment confirmed?: NA Do you need transportation to your follow-up appointment?: No Do you understand care options if your condition(s) worsen?: Yes-patient verbalized understanding   [name]- case manager will call to schedule SIGNATURE Karena Addison, LPN [facility] Nurse Health Advisor Direct Dial [phone]

## 2023-05-19 NOTE — Telephone Encounter (Signed)
Please schedule a lab visit only soon  Thanks

## 2023-05-22 ENCOUNTER — Other Ambulatory Visit: Payer: Self-pay | Admitting: Physical Medicine and Rehabilitation

## 2023-05-22 ENCOUNTER — Other Ambulatory Visit: Payer: Self-pay | Admitting: Nurse Practitioner

## 2023-05-22 ENCOUNTER — Other Ambulatory Visit: Payer: Self-pay | Admitting: Family Medicine

## 2023-05-23 ENCOUNTER — Other Ambulatory Visit (INDEPENDENT_AMBULATORY_CARE_PROVIDER_SITE_OTHER): Payer: PRIVATE HEALTH INSURANCE

## 2023-05-23 DIAGNOSIS — E876 Hypokalemia: Secondary | ICD-10-CM

## 2023-05-23 LAB — COMPREHENSIVE METABOLIC PANEL
ALT: 65 U/L — ABNORMAL HIGH (ref 0–53)
AST: 58 U/L — ABNORMAL HIGH (ref 0–37)
Albumin: 3.8 g/dL (ref 3.5–5.2)
Alkaline Phosphatase: 112 U/L (ref 39–117)
BUN: 9 mg/dL (ref 6–23)
CO2: 29 meq/L (ref 19–32)
Calcium: 9.6 mg/dL (ref 8.4–10.5)
Chloride: 96 meq/L (ref 96–112)
Creatinine, Ser: 0.78 mg/dL (ref 0.40–1.50)
GFR: 99.59 mL/min (ref >60.00)
Glucose, Bld: 205 mg/dL — ABNORMAL HIGH (ref 70–99)
Potassium: 3.1 meq/L — ABNORMAL LOW (ref 3.5–5.1)
Sodium: 134 meq/L — ABNORMAL LOW (ref 135–145)
Total Bilirubin: 0.4 mg/dL (ref 0.2–1.2)
Total Protein: 7.2 g/dL (ref 6.0–8.3)

## 2023-05-25 NOTE — Progress Notes (Signed)
His potassium is still little bit low.  This is probably due to his Lasix and blood pressure meds.  Given that he has had good control with these recently, do not think we should adjust these medications however would be a good idea for [place] to start a potassium supplement.  Please send a prescription with 20 mill equivalents daily.  We can recheck again in a few weeks.  His liver numbers are still elevated.  Recommend right upper quadrant ultrasound to further evaluate.  Please place ultrasound order.

## 2023-05-29 ENCOUNTER — Other Ambulatory Visit: Payer: Self-pay | Admitting: *Deleted

## 2023-05-29 DIAGNOSIS — E876 Hypokalemia: Secondary | ICD-10-CM

## 2023-05-29 DIAGNOSIS — R7989 Other specified abnormal findings of blood chemistry: Secondary | ICD-10-CM

## 2023-05-29 MED ORDER — POTASSIUM CHLORIDE CRYS ER 20 MEQ PO TBCR: 20.0000 meq | EXTENDED_RELEASE_TABLET | Freq: Every day | ORAL | 1 refills | Status: DC

## 2023-06-01 ENCOUNTER — Encounter: Payer: Self-pay | Admitting: Family Medicine

## 2023-06-01 ENCOUNTER — Ambulatory Visit (INDEPENDENT_AMBULATORY_CARE_PROVIDER_SITE_OTHER): Payer: PRIVATE HEALTH INSURANCE | Admitting: Family Medicine

## 2023-06-01 VITALS — BP 124/86 | HR 95 | Temp 96.5°F

## 2023-06-01 DIAGNOSIS — R600 Localized edema: Secondary | ICD-10-CM

## 2023-06-01 DIAGNOSIS — I10 Essential (primary) hypertension: Secondary | ICD-10-CM

## 2023-06-01 NOTE — Patient Instructions (Signed)
It was very nice to see you today!  I am glad that you are feeling better.  Please come back in a couple of weeks to recheck your potassium.  We will order an ultrasound of your liver.  Please make sure that you are getting plenty of activity and eating healthy.  Return if symptoms worsen or fail to improve.   Take care, Dr [name]  PLEASE NOTE:  If you had any lab tests, please let us know if you have not heard back within a few days. You may see your results on mychart before we have a chance to review them but we will give you a call once they are reviewed by [place].   If we ordered any referrals today, please let us know if you have not heard from their office within the next week.   If you had any urgent prescriptions sent in today, please check with the pharmacy within an hour of our visit to make sure the prescription was transmitted appropriately.   Please try these tips to maintain a healthy lifestyle:  Eat at least 3 REAL meals and 1-2 snacks per day.  Aim for no more than 5 hours between eating.  If you eat breakfast, please do so within one hour of getting up.   Each meal should contain half fruits/vegetables, one quarter protein, and one quarter carbs (no bigger than a computer mouse)  Cut down on sweet beverages. This includes juice, soda, and sweet tea.   Drink at least 1 glass of water with each meal and aim for at least 8 glasses per day  Exercise at least 150 minutes every week.

## 2023-06-01 NOTE — Assessment & Plan Note (Signed)
Very well-controlled on HCTZ 12.5 mg daily and Lasix 40 mg daily.  This combination is likely contributing to his hypokalemia however given his good control we will continue for now.  Will replete his potassium as above.  He will come back in a couple of weeks to recheck.

## 2023-06-01 NOTE — Assessment & Plan Note (Signed)
Blood pressure at goal on HCTZ 12.5 mg daily.  This is likely contributing some to his hypokalemia however given his great control with blood pressure we will continue his current dose of HCTZ for now and manage his hypokalemia as above.

## 2023-06-01 NOTE — Progress Notes (Signed)
Chief Complaint:  Christopher [name] is a 57 y.o. male who presents today for a TCM visit.  Assessment/Plan:  New/Acute Problems: Encephalopathy secondary to UTI Resolved.  No signs of recurrence.  Do not need to do any further workup at this point.  He will let us know if he has any ongoing issues  Hypokalemia Initially very hypokalemic in the hospital.  This was repleted.  We rechecked last week and still downtrending to 3.1.  Likely having hypokalemia secondary to Lasix use secondary to lower extremity edema secondary to debility due to his paraplegia. We sent in potassium supplements which he actually just started on yesterday.  He is on 20 mill equivalents daily.  He will come back in a couple of weeks to recheck potassium.  Will adjust dose of potassium supplementation as needed.  Elevated LFTs Likely secondary to fatty liver secondary to sedentary lifestyle secondary to debility secondary to his paraplegia.  Will check right upper quadrant ultrasound to further evaluate.  Encouraged healthy diet and exercise.   Chronic Problems Addressed Today: Essential hypertension Blood pressure at goal on HCTZ 12.5 mg daily.  This is likely contributing some to his hypokalemia however given his great control with blood pressure we will continue his current dose of HCTZ for now and manage his hypokalemia as above.  Leg edema Very well-controlled on HCTZ 12.5 mg daily and Lasix 40 mg daily.  This combination is likely contributing to his hypokalemia however given his good control we will continue for now.  Will replete his potassium as above.  He will come back in a couple of weeks to recheck.    Subjective:  HPI:  Summary of Hospital admission: Reason for admission: Confusion Date of admission: 05/16/2023 Date of discharge: 05/18/2023 Date of Interactive contact: 05/19/2023 Summary of Hospital course: Patient presented to the ED after fall at home and loss of consciousness.  Was noted to  be very confused after his fall as well.  He was admitted for further evaluation.  Had workup there which was consistent with UTI which is thought to be possibly the cause of his metabolic encephalopathy.  He was started on ceftriaxone for this.  Remainder of his medical issues are stable.  He improved clinically and then discharged home on hospital day 2 to finish antibiotics with keflex.   Interim history:  He has done well since being home for the last couple of weeks.  Has finished his course of antibiotics.  No further episodes of confusion.  No fevers or chills.  He was here last week for repeat labs.  Still having persistent hypokalemia.  Likely secondary to Lasix use.  We started him on potassium supplement which she did start yesterday.  He has had a bit of shakiness and weakness but otherwise feels at his baseline.  ROS: Per HPI, otherwise a complete review of systems was negative.   PMH:  The following were reviewed and entered/updated in epic: Past Medical History:  Diagnosis Date   Acute on chronic respiratory failure with hypoxia (HCC) 06/2018   trach removed 11-16-2018, on vent from jan until may 2020 - uses albuterol prn   Anxiety    Bacteremia due to Pseudomonas 06/2018   Chronic osteomyelitis (HCC)    Chronic pain syndrome    Clostridium difficile colitis 10/30/2019   tx with abx    Depression    DVT (deep venous thrombosis) (HCC) 2020   right brachial post PICC line   History of blood  transfusion 06/2018   History of Clostridioides difficile colitis    History of kidney stones    Hypertension    norvasc d/c by pcp on 11/05/19   Multiple traumatic injuries    Penile pain 11/18/2019   Pneumonia 11/2009   2020 x 2   Walker as ambulation aid    Wheelchair bound    electric   Wound discharge    left hip wound with bloody/clear drainage change dressing q day surgilube with gauze, between legs wound using calcium algenate pad bid   Patient Active Problem List    Diagnosis Date Noted   Dyslipidemia 05/05/2023   Metabolic syndrome 05/04/2023   At risk for Clostridium difficile infection 04/10/2023   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   At risk for complication of anesthesia 10/29/2022   Therapeutic drug monitoring 10/27/2022   Bladder spasms 10/03/2022   Leg edema 09/30/2022   Bilateral inguinal hernia without obstruction or gangrene 07/23/2022   Encounter for care related to feeding tube 07/23/2022   Irritant contact dermatitis associated with fecal stoma 07/23/2022   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   Memory deficits 01/12/2022   Hypnagogic jerks 09/22/2021   Neuromuscular weakness (HCC) 06/24/2021   Depression, major, single episode, moderate (HCC) 06/24/2021   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   Chronic osteomyelitis of hip (HCC) 11/22/2020   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/22/2020   Dysphagia 07/02/2020   Therapeutic opioid induced constipation 04/11/2020   Status post peripherally inserted central catheter (PICC) central line placement 03/17/2020   Skin-picking disorder 03/12/2020   Recurrent pain of right knee 11/08/2019   Essential hypertension 11/05/2019   Myofascial pain dysfunction syndrome 08/05/2019   Complicated urinary tract infection 05/30/2019   Wound infection, posttraumatic 02/28/2019   Hydronephrosis concurrent with and due to calculi of kidney and ureter 02/28/2019   GERD (gastroesophageal reflux disease) 02/28/2019   Suprapubic catheter (HCC) 02/28/2019   Anxiety 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 01/29/2019   Chronic pain due to trauma 01/29/2019   Neuropathic pain 01/29/2019   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   Insomnia 01/14/2019   Current use of proton pump inhibitor 01/14/2019   Wound of buttock 12/28/2018   Anxiety and depression 12/17/2018   Debility 11/23/2018   Chronic pain syndrome    Multiple traumatic injuries    Pressure injury of skin 08/13/2018   Urethral injury  07/13/2018   Past Surgical History:  Procedure Laterality Date   APPLICATION OF A-CELL OF BACK N/A 08/06/2018   Procedure: Application Of A-Cell Of Back;  Surgeon: Peggye Form, DO;  [facility]: [facility];  Service: Plastics;  Laterality: N/A;   APPLICATION OF A-CELL OF EXTREMITY Left 08/06/2018   Procedure: Application Of A-Cell Of Extremity;  Surgeon: Peggye Form, DO;  [facility]: [facility];  Service: Plastics;  Laterality: Left;   APPLICATION OF A-CELL OF EXTREMITY Left 09/18/2019   Procedure: APPLICATION OF A-CELL OF EXTREMITY;  Surgeon: Peggye Form, DO;  [facility];  Service: Plastics;  Laterality: Left;   APPLICATION OF WOUND VAC  07/12/2018   Procedure: Application Of Wound Vac to the Left Thigh and Scrotum.;  Surgeon: Roby Lofts, MD;  [facility]: [facility];  Service: Orthopedics;;   APPLICATION OF WOUND VAC  07/10/2018   Procedure: Application Of Wound Vac;  Surgeon: Berna Bue, MD;  Location: [facility] OR;  Service: General;;   BIOPSY  11/02/2022   Procedure: BIOPSY;  Surgeon: Lemar Lofty., MD;  Location: [facility] END[facility];  Service:  Gastroenterology;;   COLON SURGERY  2020   colostomy   COLOSTOMY N/A 07/23/2018   Procedure: COLOSTOMY;  Surgeon: Violeta Gelinas, MD;  Location: [facility] OR;  Service: General;  Laterality: N/A;   CYSTOSCOPY W/ URETERAL STENT PLACEMENT N/A 07/15/2018   Procedure: RETROGRADE URETHROGRAM;  Surgeon: Marcine Matar, MD;  Location[facility] OR;  Service: Urology;  Laterality: N/A;   CYSTOSCOPY WITH LITHOLAPAXY N/A 05/06/2019   Procedure: CYSTOSCOPY BASKET BLADDER STONE EXTRACTION;  Surgeon: Malen Gauze, MD;  Location: [facility];  Service: Urology;  Laterality: N/A;  30 MINS   CYSTOSTOMY N/A 05/06/2019   Procedure: REPLACEMENT OF SUPRAPUBIC CATHETER;  Surgeon: Malen Gauze, MD;  Location: [facility];  Service: Urology;  Laterality: N/A;   DEBRIDEMENT AND CLOSURE WOUND Left 03/04/2019   Procedure:  Excision of hip wound with placement of Acell;  Surgeon: Peggye Form, DO;  [facility];  Service: Plastics;  Laterality: Left;   ESOPHAGOGASTRODUODENOSCOPY N/A 08/14/2018   Procedure: ESOPHAGOGASTRODUODENOSCOPY (EGD);  Surgeon: Violeta Gelinas, MD;  Location: [facility] ENDOSCOPY;  Service: General;  Laterality: N/A;  bedside   ESOPHAGOGASTRODUODENOSCOPY (EGD) WITH PROPOFOL N/A 11/02/2022   Procedure: ESOPHAGOGASTRODUODENOSCOPY (EGD) WITH PROPOFOL;  Surgeon: Meridee Score Netty Starring., MD;  Location: [facility];  Service: Gastroenterology;  Laterality: N/A;   FACIAL RECONSTRUCTION SURGERY     X 2--once as a teenager and second time in his 67's   HARDWARE REMOVAL Left 03/04/2019   Procedure: Left Hip Hardware Removal;  Surgeon: Roby Lofts, MD;  [facility];  Service: Orthopedics;  Laterality: Left;   HEMOSTASIS CLIP PLACEMENT  11/02/2022   Procedure: HEMOSTASIS CLIP PLACEMENT;  Surgeon: Lemar Lofty., MD;  Location: [facility];  Service: Gastroenterology;;   HIP PINNING,CANNULATED Left 07/12/2018   Procedure: CANNULATED HIP PINNING;  Surgeon: Roby Lofts, MD;  [facility];  Service: Orthopedics;  Laterality: Left;   HIP SURGERY     HOLMIUM LASER APPLICATION Right 07/15/2019   Procedure: HOLMIUM LASER APPLICATION;  Surgeon: Malen Gauze, MD;  Location: [facility];  Service: Urology;  Laterality: Right;   HOT HEMOSTASIS N/A 11/02/2022   Procedure: HOT HEMOSTASIS (ARGON PLASMA COAGULATION/BICAP);  Surgeon: Lemar Lofty., MD;  Location: [facility];  Service: Gastroenterology;  Laterality: N/A;   I & D EXTREMITY Left 07/25/2018   Procedure: Debridement of buttock, scrotum and left leg, placement of acell and vac;  Surgeon: Peggye Form, DO;  [facility];  Service: Plastics;  Laterality: Left;   I & D EXTREMITY N/A 08/06/2018   Procedure: Debridement of buttock, scrotum and left leg;  Surgeon: Peggye Form, DO;  [facility];  Service: Plastics;  Laterality: N/A;   I & D EXTREMITY N/A 08/13/2018   Procedure: Debridement of buttock, scrotum and left leg, placement of acell and vac;  Surgeon: Peggye Form, DO;  [facility];  Service: Plastics;  Laterality: N/A;  90 min, please   INCISION AND DRAINAGE HIP Left 09/18/2019   Procedure: IRRIGATION AND DEBRIDEMENT HIP/ PELVIS WITH WOUND VAC PLACEMENT;  Surgeon: Roby Lofts, MD;  [facility];  Service: Orthopedics;  Laterality: Left;   INCISION AND DRAINAGE OF WOUND N/A 07/18/2018   Procedure: Debridement of left leg, buttocks and scrotal wound with placement of acell and Flexiseal;  Surgeon: Peggye Form, DO;  [facility];  Service: Plastics;  Laterality: N/A;   INCISION AND DRAINAGE OF WOUND Left 08/29/2018   Procedure:  Debridement of buttock, scrotum and left leg, placement of acell and vac;  Surgeon: Peggye Form, DO;  [facility];  Service: Plastics;  Laterality: Left;  75 min, please   INCISION AND DRAINAGE OF WOUND Bilateral 10/23/2018   Procedure: DEBRIDEMENT OF BUTTOCK,SCROTUM, AND LEG WOUNDS WITH PLACEMENT OF ACELL- BILATERAL 90 MIN;  Surgeon: Peggye Form, DO;  [facility];  Service: Plastics;  Laterality: Bilateral;   IR ANGIOGRAM PELVIS SELECTIVE OR SUPRASELECTIVE  07/10/2018   IR ANGIOGRAM PELVIS SELECTIVE OR SUPRASELECTIVE  07/10/2018   IR ANGIOGRAM SELECTIVE EACH ADDITIONAL VESSEL  07/10/2018   IR EMBO ART  VEN HEMORR LYMPH EXTRAV  INC GUIDE ROADMAPPING  07/10/2018   IR GASTROSTOMY TUBE MOD SED  01/21/2021   IR NEPHROSTOMY PLACEMENT LEFT  04/05/2019   IR NEPHROSTOMY PLACEMENT RIGHT  05/31/2019   IR US GUIDE BX ASP/DRAIN  07/10/2018   IR US GUIDE VASC ACCESS RIGHT  07/10/2018   IR VENO/EXT/UNI LEFT  07/10/2018   IRRIGATION AND DEBRIDEMENT OF WOUND WITH SPLIT THICKNESS SKIN GRAFT Left 09/19/2018   Procedure: Debridement of gluteal wound with placement of acell to left leg.;  Surgeon: Peggye Form, DO;   [facility];  Service: Plastics;  Laterality: Left;  2.5 hours, please   LAPAROTOMY N/A 07/12/2018   Procedure: EXPLORATORY LAPAROTOMY;  Surgeon: Violeta Gelinas, MD;  Location: [facility] OR;  Service: General;  Laterality: N/A;   LAPAROTOMY N/A 07/15/2018   Procedure: WOUND EXPLORATION; CLOSURE OF ABDOMEN;  Surgeon: Violeta Gelinas, MD;  Location: [facility] OR;  Service: General;  Laterality: N/A;   LAPAROTOMY  07/10/2018   Procedure: Exploratory Laparotomy;  Surgeon: Berna Bue, MD;  Location: [facility] OR;  Service: General;;   MASS EXCISION Left 09/18/2019   Procedure: EXCISION UPPER LEFT INNER THIGH WOUND;  Surgeon: Peggye Form, DO;  [facility];  Service: Plastics;  Laterality: Left;   NEPHROLITHOTOMY Right 07/15/2019   Procedure: NEPHROLITHOTOMY PERCUTANEOUS;  Surgeon: Malen Gauze, MD;  Location: [facility];  Service: Urology;  Laterality: Right;  90 MINS   PEG PLACEMENT N/A 08/14/2018   Procedure: PERCUTANEOUS ENDOSCOPIC GASTROSTOMY (PEG) PLACEMENT;  Surgeon: Violeta Gelinas, MD;  Location: [facility] ENDOSCOPY;  Service: General;  Laterality: N/A;   PERCUTANEOUS TRACHEOSTOMY N/A 08/02/2018   Procedure: PERCUTANEOUS TRACHEOSTOMY;  Surgeon: Violeta Gelinas, MD;  Location: [facility] OR;  Service: General;  Laterality: N/A;   RADIOLOGY WITH ANESTHESIA N/A 07/10/2018   Procedure: IR WITH ANESTHESIA;  Surgeon: Simonne Come, MD;  Location: [facility] OR;  Service: Radiology;  Laterality: N/A;   RADIOLOGY WITH ANESTHESIA Right 07/10/2018   Procedure: Ir With Anesthesia;  Surgeon: Simonne Come, MD;  Location: [facility] OR;  Service: Radiology;  Laterality: Right;   SAVORY DILATION N/A 11/02/2022   Procedure: SAVORY DILATION;  Surgeon: Meridee Score Netty Starring., MD;  Location: [facility];  Service: Gastroenterology;  Laterality: N/A;   SCROTAL EXPLORATION N/A 07/15/2018   Procedure: SCROTUM DEBRIDEMENT;  Surgeon: Marcine Matar, MD;  Location: [facility] OR;  Service: Urology;  Laterality: N/A;   SHOULDER SURGERY      SKIN SPLIT GRAFT Right 09/19/2018   Procedure: Skin Graft Split Thickness;  Surgeon: Peggye Form, DO;  [facility];  Service: Plastics;  Laterality: Right;   SKIN SPLIT GRAFT N/A 10/03/2018   Procedure: Split thickness skin graft to gluteal area with acell placement;  Surgeon: Peggye Form, DO;  [facility];  Service: Plastics;  Laterality: N/A;  3 hours, please  VACUUM ASSISTED CLOSURE CHANGE N/A 07/12/2018   Procedure: ABDOMINAL VACUUM ASSISTED CLOSURE CHANGE and abdominal washout;  Surgeon: Violeta Gelinas, MD;  Location: [facility] OR;  Service: General;  Laterality: N/A;   WOUND DEBRIDEMENT Left 07/23/2018   Procedure: DEBRIDEMENT LEFT BUTTOCK  WOUND;  Surgeon: Violeta Gelinas, MD;  Location: [facility] OR;  Service: General;  Laterality: Left;   WOUND EXPLORATION Left 07/10/2018   Procedure: WOUND EXPLORATION LEFT GROIN;  Surgeon: Berna Bue, MD;  [facility];  Service: General;  Laterality: Left;    Family History  Problem Relation Age of Onset   Breast cancer Mother        with mets to the bones    Medications- Reconciled discharge and current medications in Epic.  Current Outpatient Medications  Medication Sig Dispense Refill   albuterol (VENTOLIN HFA) 108 (90 Base) MCG/ACT inhaler INHALE 2 PUFFS BY MOUTH EVERY 6 HOURS AS NEEDED FOR WHEEZE OR SHORTNESS OF BREATH 18 each 1   Budeson-Glycopyrrol-Formoterol (BREZTRI AEROSPHERE) 160-9-4.8 MCG/ACT AERO Inhale 2 puffs into the lungs in the morning and at bedtime. 1 each 5   ciprofloxacin (CIPRO) 500 MG tablet Take 1 tablet (500 mg total) by mouth 2 (two) times daily. 60 tablet 11   dextromethorphan-guaiFENesin (MUCINEX DM) 30-600 MG 12hr tablet Take 2 tablets by mouth 2 (two) times daily.     famotidine (PEPCID) 40 MG tablet TAKE 1 TABLET BY MOUTH TWICE A DAY (Patient taking differently: Take 40 mg by mouth at bedtime.) 180 tablet 1   fentaNYL (DURAGESIC) 75 MCG/HR Place 1 patch onto the skin every other day. ICD 10 code-  G89.21- For chronic pain 15 patch 0   furosemide (LASIX) 40 MG tablet Take 1 tablet (40 mg total) by mouth daily. 90 tablet 1   gabapentin (NEURONTIN) 300 MG capsule TAKE 1 CAPSULE BY MOUTH THREE TIMES A DAY 90 capsule 5   GEMTESA 75 MG TABS Take 75 mg by mouth daily.     hydrochlorothiazide (HYDRODIURIL) 12.5 MG tablet Take 1 tablet (12.5 mg total) by mouth daily. 90 tablet 3   imipramine (TOFRANIL) 25 MG tablet TAKE 1 TABLET (25 MG TOTAL) BY MOUTH IN THE MORNING, AT NOON, AND AT BEDTIME. FOR BLADDER SPASMS 270 tablet 1   LINZESS 72 MCG capsule TAKE 2 CAPSULES (145 MCG TOTAL) BY MOUTH DAILY BEFORE BREAKFAST. 30 capsule 5   LORazepam (ATIVAN) 0.5 MG tablet Take 0.5 mg by mouth 3 (three) times daily.     melatonin ([facility] MELATONIN) 3 MG TABS tablet Take 1 tablet (3 mg total) by mouth at bedtime. 10 tablet 0   morphine (MSIR) 15 MG tablet Take 1 tablet (15 mg total) by mouth every 3 (three) hours as needed for severe pain (pain score 7-10). G89.21- for chornic pain 200 tablet 0   Multiple Vitamin (MULTIVITAMIN WITH MINERALS) TABS tablet Take 1 tablet by mouth daily.     naloxone (NARCAN) nasal spray 4 mg/0.1 mL To use if pt develops unconsciousness or confusion that family thinks is related to opioids. 1 each 3   OLANZapine (ZYPREXA) 5 MG tablet Take 2.5-10 mg by mouth See admin instructions. 2.5 mgh in the morning, and midday, and 10 mg at bedtime     oxybutynin (DITROPAN) 5 MG tablet Take 5 mg by mouth See admin instructions. Take 5 mg by mouth three times a day and an additional 5 mg once daily as needed for urinary urgency     pantoprazole (PROTONIX) 40 MG tablet Take 1  tablet (40 mg total) by mouth daily. 90 tablet 2   PARoxetine (PAXIL) 40 MG tablet TAKE 1 TABLET BY MOUTH EVERYDAY AT BEDTIME 90 tablet 2   potassium chloride SA (KLOR-CON M) 20 MEQ tablet Take 1 tablet (20 mEq total) by mouth daily. 30 tablet 1   Probiotic Product (PROBIOTIC COLON SUPPORT) CAPS Take 1 capsule by mouth daily.      promethazine (PHENERGAN) 12.5 MG tablet TAKE 1 TABLET (12.5 MG TOTAL) BY MOUTH EVERY 6 (SIX) HOURS AS NEEDED FOR NAUSEA, VOMITING OR REFRACTORY NAUSEA / VOMITING. 90 tablet 5   senna-docusate (SENOKOT-S) 8.6-50 MG tablet Take 2 tablets by mouth 3 (three) times daily.     Spacer/Aero-Holding Chambers (AEROCHAMBER MV) inhaler Use as instructed with MDI 1 each 0   vancomycin (VANCOCIN) 125 MG capsule Take 1 capsule (125 mg total) by mouth in the morning and at bedtime. 60 capsule 11   No current facility-administered medications for this visit.    Allergies-reviewed and updated Allergies  Allergen Reactions   Methadone Other (See Comments)    Hallucinations/confusion    Social History   Socioeconomic History   Marital status: Married    Spouse name: Not on file   Number of children: Not on file   Years of education: Not on file   Highest education level: Not on file  Occupational History   Occupation: Disable  Tobacco Use   Smoking status: Former    Current packs/day: 0.00    Average packs/day: 1 pack/day for 20.0 years (20.0 ttl pk-yrs)    Types: Cigarettes    Start date: 07/10/1998    Quit date: 07/10/2018    Years since quitting: 4.8   Smokeless tobacco: Never  Vaping Use   Vaping status: Never Used  Substance and Sexual Activity   Alcohol use: Never   Drug use: Yes    Types: Oxycodone, Fentanyl    Comment: Fentanyl patch/oxycodone since 06/2018   Sexual activity: Yes  Other Topics Concern   Not on file  Social History Narrative   ** Merged History Encounter **       Social Drivers of Health   Financial Resource Strain: Low Risk  (03/01/2019)   Overall Financial Resource Strain (CARDIA)    Difficulty of Paying Living Expenses: Not very hard  Food Insecurity: No Food Insecurity (05/17/2023)   Hunger Vital Sign    Worried About Running Out of Food in the Last Year: Never true    Ran Out of Food in the Last Year: Never true  Recent Concern: Food Insecurity - Food  Insecurity Present (05/16/2023)   Hunger Vital Sign    Worried About Running Out of Food in the Last Year: Sometimes true    Ran Out of Food in the Last Year: Sometimes true  Transportation Needs: No Transportation Needs (05/16/2023)   PRAPARE - Administrator, Civil Service (Medical): No    Lack of Transportation (Non-Medical): No  Physical Activity: Inactive (03/01/2019)   Exercise Vital Sign    Days of Exercise per Week: 0 days    Minutes of Exercise per Session: 0 min  Stress: Stress Concern Present (03/01/2019)   [employer] of Occupational Health - Occupational Stress Questionnaire    Feeling of Stress : Rather much  Social Connections: Not on file        Objective:  Physical Exam: BP 124/86   Pulse 95   Temp (!) 96.5 F (35.8 C) (Temporal)   SpO2 97%  Gen: NAD, resting comfortably CV: RRR with no murmurs appreciated Pulm: NWOB, CTAB with no crackles, wheezes, or rhonchi Psych: Normal affect and thought content  Time Spent: 45 minutes of total time was spent on the date of the encounter performing the following actions: chart review prior to seeing the patient including recent hospitalization, obtaining history, performing a medically necessary exam, counseling on the treatment plan, placing orders, and documenting in our EHR.        Katina Degree. [name], MD 06/01/2023 2:21 PM

## 2023-06-02 ENCOUNTER — Telehealth (HOSPITAL_BASED_OUTPATIENT_CLINIC_OR_DEPARTMENT_OTHER): Payer: Self-pay | Admitting: Family Medicine

## 2023-06-02 ENCOUNTER — Other Ambulatory Visit: Payer: Self-pay

## 2023-06-02 MED ORDER — FENTANYL 75 MCG/HR TD PT72: 1.0000 | MEDICATED_PATCH | TRANSDERMAL | 0 refills | Status: DC

## 2023-06-02 MED ORDER — MORPHINE SULFATE 15 MG PO TABS: 15.0000 mg | ORAL_TABLET | ORAL | 0 refills | Status: DC | PRN

## 2023-06-02 NOTE — Telephone Encounter (Signed)
Filled  Written  ID  Drug  QTY  Days  Prescriber  RX #  Dispenser  Refill  Daily Dose*  Pymt Type  PMP  05/29/2023 11/29/2022 1  Gabapentin 300 Mg Capsule 90.00 30 Me Lov [id number] Nor ([id number]) 5/5  Worker's Comp [place] 05/10/2023 05/10/2023 1  Lorazepam 0.5 Mg Tablet 120.00 30 Ca Arc [id number] Nor ([id number]) 0/1 2.00 LME Worker's Comp [place] 05/04/2023 05/03/2023 1  Morphine Sulfate Ir 15 Mg Tab 200.00 30 Me Lov [id number] Nor ([id number]) 0/0 100.00 MME Worker's Comp [place] 05/03/2023 05/03/2023 1  Fentanyl 75 Mcg/hr Patch 15.00 30 Me Lov [id number] Nor ([id number]) 0/0 270.00 MME Worker's Comp [place]  Please send new RX [facility] [address] in [place].

## 2023-06-09 ENCOUNTER — Ambulatory Visit (HOSPITAL_BASED_OUTPATIENT_CLINIC_OR_DEPARTMENT_OTHER): Payer: Self-pay

## 2023-06-12 ENCOUNTER — Encounter
Payer: No Typology Code available for payment source | Attending: Physical Medicine and Rehabilitation | Admitting: Physical Medicine and Rehabilitation

## 2023-06-12 ENCOUNTER — Telehealth: Payer: Self-pay | Admitting: Physical Medicine and Rehabilitation

## 2023-06-12 ENCOUNTER — Encounter: Payer: Self-pay | Admitting: Physical Medicine and Rehabilitation

## 2023-06-12 VITALS — BP 138/89 | HR 102 | Ht 75.0 in | Wt 194.0 lb

## 2023-06-12 DIAGNOSIS — Z993 Dependence on wheelchair: Secondary | ICD-10-CM | POA: Diagnosis present

## 2023-06-12 DIAGNOSIS — G894 Chronic pain syndrome: Secondary | ICD-10-CM

## 2023-06-12 DIAGNOSIS — R413 Other amnesia: Secondary | ICD-10-CM | POA: Diagnosis present

## 2023-06-12 DIAGNOSIS — F321 Major depressive disorder, single episode, moderate: Secondary | ICD-10-CM | POA: Diagnosis present

## 2023-06-12 DIAGNOSIS — G709 Myoneural disorder, unspecified: Secondary | ICD-10-CM

## 2023-06-12 DIAGNOSIS — M792 Neuralgia and neuritis, unspecified: Secondary | ICD-10-CM | POA: Diagnosis present

## 2023-06-12 DIAGNOSIS — G8921 Chronic pain due to trauma: Secondary | ICD-10-CM | POA: Diagnosis present

## 2023-06-12 MED ORDER — FENTANYL 75 MCG/HR TD PT72: 1.0000 | MEDICATED_PATCH | TRANSDERMAL | 0 refills | Status: DC

## 2023-06-12 MED ORDER — MORPHINE SULFATE 15 MG PO TABS: 15.0000 mg | ORAL_TABLET | ORAL | 0 refills | Status: DC | PRN

## 2023-06-12 MED ORDER — GABAPENTIN 300 MG PO CAPS: 300.0000 mg | ORAL_CAPSULE | Freq: Three times a day (TID) | ORAL | 3 refills | Status: DC

## 2023-06-12 NOTE — Telephone Encounter (Signed)
Patient appt on 2/10 at 11:00 was scheduled for 20 minutes. You do not have any openings for 40 minutes avail. I have noted on the appt if any comes avail to change.

## 2023-06-12 NOTE — Patient Instructions (Addendum)
Pt is a 57 yr old male with hx of Multitrauma- causing L femoral neck fx, degloving of L hip to going/scrotum, bladder neck trauma- got SPC, developed compartment syndrome -s/p surgery for that; also diverting colostomy, skin grafts, and s/p trach and PEG- PEG is out. 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 S/P screw removal and on IV ABX for L hip osteomyelitis. 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  Has R severe ulnar neuropathy as well. O2 3L full time.   Here for f/u on multitrauma and chronic L hip osteomyelitis with associated chronic pain.    Urine drug screen done today per clinic policy.     2. Colonization vs UTI- one is because will always have bacteria- but needs massive overgrowth of bacteria. If color has changed, become strong smell, drink more water.   3. Symptoms of UTI- can be: urinary pain; confusion; fever, and feeling bad.  Urinary discoloration, odor- that's NOT a UTI.    4. AVOID tylenol.    5.  Pain meds CAN be cause of fatty liver- of contribute to liver issues, but don't have another option right now.   6. Will refill MSIR 15 mg q3 hours #200 as well as Duragesic 75 mg q2 days- #15.    7.  Usually does preventative Antibiotics when getting UTI's every other month- but not when getting ~ 2x/year, unfortunately. Already on Cipro for osteomyelits, so wouldn't add another ABX for prevention.    8. Will refill gabapentin 300 mg TID- with 1 year- 3 months of refills.   9. Doesn't need refills of phenergan, Paxil or  Imipramine.    10. F/U in 6 weeks- double   11. To start counseling soon ( pt's wife brother going on hospice due to cirrhosis)   12. D/w worker's comp nurse about issues as detailed above.

## 2023-06-12 NOTE — Progress Notes (Signed)
Subjective:    Patient ID: Christopher [name], male    DOB: 1966/01/13, 57 y.o.   MRN: [id number]  HPI  Pt is a 57 yr old male with hx of Multitrauma- causing L femoral neck fx, degloving of L hip to going/scrotum, bladder neck trauma- got SPC, developed compartment syndrome -s/p surgery for that; also diverting colostomy, skin grafts, and s/p trach and PEG- PEG is out. 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 S/P screw removal and on IV ABX for L hip osteomyelitis. 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  Has R severe ulnar neuropathy as well. O2 3L full time.   Here for f/u on multitrauma and chronic L hip osteomyelitis with associated chronic pain.     Had a fall and UTI- was very confused.  Got up to walk- and fell. Got IV ABX from UTI.   Got spot above L eyebrow and cut on top of Head on L.  Hit head on w/c  when so confused.  And was bleeding badly. Since head wound.  Never tried to get up before this past time.     Was in hospital for 2 days for IV ABX-  Beginning of December.   Hasn't been using hand weights.  Stood up, but then sat back down due to shaking from fear.  Last 2-3 times PT came out he refused to walk. Scared to walk-   More fearful and depressed since came home from hospital- and more sleepy.   Takes afternoon nap for 3+ hours- sometimes. And today been dozing.  Slept last night.  Sent home on Keflex-   K+ also low- put on potassium-  LFTs also elevated- so needs to get liver and gallbladder u/s done  tomorrow.    Pain Inventory Average Pain 8 Pain Right Now 8 My pain is constant, sharp, burning, dull, tingling, and aching  In the last 24 hours, has pain interfered with the following? General activity 10 Relation with others 10 Enjoyment of life 10 What TIME of day is your pain at its worst? morning , daytime, evening, and night Sleep (in general) Poor  Pain is worse with: walking, bending, sitting, inactivity, standing, and some activites Pain improves with: rest, heat/ice, therapy/exercise, pacing activities, medication, TENS, and injections Relief from Meds: 7  Family History  Problem Relation Age of Onset   Breast cancer Mother        with mets to the bones   Social History   Socioeconomic History   Marital status: Married    Spouse name: Not on file   Number of children: Not on file   Years of education: Not on file   Highest education level: Not on file  Occupational History   Occupation: Disable  Tobacco Use   Smoking status: Former    Current packs/day: 0.00    Average packs/day: 1 pack/day for 20.0 years (20.0 ttl pk-yrs)    Types: Cigarettes    Start date: 07/10/1998    Quit date: 07/10/2018    Years  since quitting: 4.9   Smokeless tobacco: Never  Vaping Use   Vaping status: Never Used  Substance and Sexual Activity   Alcohol use: Never   Drug use: Yes    Types: Oxycodone, Fentanyl    Comment: Fentanyl patch/oxycodone since 06/2018   Sexual activity: Yes  Other Topics Concern   Not on file  Social History Narrative   ** Merged History Encounter **       Social Drivers of Health   Financial Resource Strain: Low Risk  (03/01/2019)   Overall Financial Resource Strain (CARDIA)    Difficulty of Paying Living Expenses: Not very hard  Food Insecurity: No Food Insecurity (05/17/2023)   Hunger Vital Sign    Worried About Running Out of Food in the Last Year: Never true    Ran Out of Food in the Last Year: Never true  Recent Concern: Food Insecurity -  Food Insecurity Present (05/16/2023)   Hunger Vital Sign    Worried About Running Out of Food in the Last Year: Sometimes true    Ran Out of Food in the Last Year: Sometimes true  Transportation Needs: No Transportation Needs (05/16/2023)   PRAPARE - Administrator, Civil Service (Medical): No    Lack of Transportation (Non-Medical): No  Physical Activity: Inactive (03/01/2019)   Exercise Vital Sign    Days of Exercise per Week: 0 days    Minutes of Exercise per Session: 0 min  Stress: Stress Concern Present (03/01/2019)   [employer] of Occupational Health - Occupational Stress Questionnaire    Feeling of Stress : Rather much  Social Connections: Not on file   Past Surgical History:  Procedure Laterality Date   APPLICATION OF A-CELL OF BACK N/A 08/06/2018   Procedure: Application Of A-Cell Of Back;  Surgeon: Peggye Form, DO;  [facility];  Service: Plastics;  Laterality: N/A;   APPLICATION OF A-CELL OF EXTREMITY Left 08/06/2018   Procedure: Application Of A-Cell Of Extremity;  Surgeon: Peggye Form, DO;  [facility];  Service: Plastics;  Laterality: Left;   APPLICATION OF A-CELL OF EXTREMITY Left 09/18/2019   Procedure: APPLICATION OF A-CELL OF EXTREMITY;  Surgeon: Peggye Form, DO;  [facility];  Service: Plastics;  Laterality: Left;   APPLICATION OF WOUND VAC  07/12/2018   Procedure: Application Of Wound Vac to the Left Thigh and Scrotum.;  Surgeon: Roby Lofts, MD;  [facility];  Service: Orthopedics;;   APPLICATION OF WOUND VAC  07/10/2018   Procedure: Application Of Wound Vac;  Surgeon: Berna Bue, MD;  Location: [facility] OR;  Service: General;;   BIOPSY  11/02/2022   Procedure: BIOPSY;  Surgeon: Lemar Lofty., MD;  Location: [facility];  Service: Gastroenterology;;   COLON SURGERY  2020   colostomy   COLOSTOMY N/A 07/23/2018   Procedure: COLOSTOMY;  Surgeon: Violeta Gelinas, MD;  Location: [facility] OR;  Service:  General;  Laterality: N/A;   CYSTOSCOPY W/ URETERAL STENT PLACEMENT N/A 07/15/2018   Procedure: RETROGRADE URETHROGRAM;  Surgeon: Marcine Matar, MD;  Location: [facility] OR;  Service: Urology;  Laterality: N/A;   CYSTOSCOPY WITH LITHOLAPAXY N/A 05/06/2019   Procedure: CYSTOSCOPY BASKET BLADDER STONE EXTRACTION;  Surgeon: Malen Gauze, MD;  Location: [facility];  Service: Urology;  Laterality: N/A;  30 MINS   CYSTOSTOMY N/A 05/06/2019   Procedure: REPLACEMENT OF SUPRAPUBIC CATHETER;  Surgeon: Malen Gauze, MD;  Location: [facility];  Service: Urology;  Laterality: N/A;   DEBRIDEMENT AND CLOSURE WOUND Left 03/04/2019   Procedure: Excision of hip wound with placement of Acell;  Surgeon: Peggye Form, DO;  [facility];  Service: Plastics;  Laterality: Left;   ESOPHAGOGASTRODUODENOSCOPY N/A 08/14/2018   Procedure: ESOPHAGOGASTRODUODENOSCOPY (EGD);  Surgeon: Violeta Gelinas, MD;  Location: [facility] ENDOSCOPY;  Service: General;  Laterality: N/A;  bedside   ESOPHAGOGASTRODUODENOSCOPY (EGD) WITH PROPOFOL N/A 11/02/2022   Procedure: ESOPHAGOGASTRODUODENOSCOPY (EGD) WITH PROPOFOL;  Surgeon: Meridee Score Netty Starring., MD;  Location: [facility] ENDOSCOPY;  Service: Gastroenterology;  Laterality: N/A;   FACIAL RECONSTRUCTION SURGERY     X 2--once as a teenager and second time in his 74's   HARDWARE REMOVAL Left 03/04/2019   Procedure: Left Hip Hardware Removal;  Surgeon: Roby Lofts, MD;  [facility];  Service: Orthopedics;  Laterality: Left;   HEMOSTASIS CLIP PLACEMENT  11/02/2022   Procedure: HEMOSTASIS CLIP PLACEMENT;  Surgeon: Lemar Lofty., MD;  Location: [facility];  Service: Gastroenterology;;   HIP PINNING,CANNULATED Left 07/12/2018   Procedure: CANNULATED HIP PINNING;  Surgeon: Roby Lofts, MD;  [facility];  Service: Orthopedics;  Laterality: Left;   HIP SURGERY     HOLMIUM LASER APPLICATION Right 07/15/2019   Procedure: HOLMIUM LASER  APPLICATION;  Surgeon: Malen Gauze, MD;  Location: [facility];  Service: Urology;  Laterality: Right;   HOT HEMOSTASIS N/A 11/02/2022   Procedure: HOT HEMOSTASIS (ARGON PLASMA COAGULATION/BICAP);  Surgeon: Lemar Lofty., MD;  Location: [facility];  Service: Gastroenterology;  Laterality: N/A;   I & D EXTREMITY Left 07/25/2018   Procedure: Debridement of buttock, scrotum and left leg, placement of acell and vac;  Surgeon: Peggye Form, DO;  [facility];  Service: Plastics;  Laterality: Left;   I & D EXTREMITY N/A 08/06/2018   Procedure: Debridement of buttock, scrotum and left leg;  Surgeon: Peggye Form, DO;  [facility];  Service: Plastics;  Laterality: N/A;   I & D EXTREMITY N/A 08/13/2018   Procedure: Debridement of buttock, scrotum and left leg, placement of acell and vac;  Surgeon: Peggye Form, DO;  [facility];  Service: Plastics;  Laterality: N/A;  90 min, please   INCISION AND DRAINAGE HIP Left 09/18/2019   Procedure: IRRIGATION AND DEBRIDEMENT HIP/ PELVIS WITH WOUND VAC PLACEMENT;  Surgeon: Roby Lofts, MD;  [facility];  Service: Orthopedics;  Laterality: Left;   INCISION AND DRAINAGE OF WOUND N/A 07/18/2018   Procedure: Debridement of left leg, buttocks and scrotal wound with placement of acell and Flexiseal;  Surgeon: Peggye Form, DO;  [facility];  Service: Plastics;  Laterality: N/A;   INCISION AND DRAINAGE OF WOUND Left 08/29/2018   Procedure: Debridement of buttock, scrotum and left leg, placement of acell and vac;  Surgeon: Peggye Form, DO;  [facility];  Service: Plastics;  Laterality: Left;  75 min, please   INCISION AND DRAINAGE OF WOUND Bilateral 10/23/2018   Procedure: DEBRIDEMENT OF BUTTOCK,SCROTUM, AND LEG WOUNDS WITH PLACEMENT OF ACELL- BILATERAL 90 MIN;  Surgeon: Peggye Form, DO;  [facility];  Service: Plastics;  Laterality: Bilateral;   IR ANGIOGRAM PELVIS  SELECTIVE OR SUPRASELECTIVE  07/10/2018   IR ANGIOGRAM PELVIS SELECTIVE OR SUPRASELECTIVE  07/10/2018   IR ANGIOGRAM SELECTIVE EACH ADDITIONAL VESSEL  07/10/2018   IR EMBO ART  VEN HEMORR LYMPH EXTRAV  INC GUIDE ROADMAPPING  07/10/2018   IR GASTROSTOMY TUBE MOD SED  01/21/2021   IR NEPHROSTOMY PLACEMENT LEFT  04/05/2019   IR NEPHROSTOMY PLACEMENT RIGHT  05/31/2019   IR US GUIDE BX ASP/DRAIN  07/10/2018   IR US GUIDE VASC ACCESS RIGHT  07/10/2018   IR VENO/EXT/UNI LEFT  07/10/2018   IRRIGATION AND DEBRIDEMENT OF WOUND WITH SPLIT THICKNESS SKIN GRAFT Left 09/19/2018   Procedure: Debridement of gluteal wound with placement of acell to left leg.;  Surgeon: Peggye Form, DO;  [facility];  Service: Plastics;  Laterality: Left;  2.5 hours, please   LAPAROTOMY N/A 07/12/2018   Procedure: EXPLORATORY LAPAROTOMY;  Surgeon: Violeta Gelinas, MD;  Location: [facility] OR;  Service: General;  Laterality: N/A;   LAPAROTOMY N/A 07/15/2018   Procedure: WOUND EXPLORATION; CLOSURE OF ABDOMEN;  Surgeon: Violeta Gelinas, MD;  Location: [facility] OR;  Service: General;  Laterality: N/A;   LAPAROTOMY  07/10/2018   Procedure: Exploratory Laparotomy;  Surgeon: Berna Bue, MD;  Location: [facility] OR;  Service: General;;   MASS EXCISION Left 09/18/2019   Procedure: EXCISION UPPER LEFT INNER THIGH WOUND;  Surgeon: Peggye Form, DO;  [facility];  Service: Plastics;  Laterality: Left;   NEPHROLITHOTOMY Right 07/15/2019   Procedure: NEPHROLITHOTOMY PERCUTANEOUS;  Surgeon: Malen Gauze, MD;  Location[facility];  Service: Urology;  Laterality: Right;  90 MINS   PEG PLACEMENT N/A 08/14/2018   Procedure: PERCUTANEOUS ENDOSCOPIC GASTROSTOMY (PEG) PLACEMENT;  Surgeon: Violeta Gelinas, MD;  Location: [facility] ENDOSCOPY;  Service: General;  Laterality: N/A;   PERCUTANEOUS TRACHEOSTOMY N/A 08/02/2018   Procedure: PERCUTANEOUS TRACHEOSTOMY;  Surgeon: Violeta Gelinas, MD;  Location: [facility] OR;  Service: General;  Laterality:  N/A;   RADIOLOGY WITH ANESTHESIA N/A 07/10/2018   Procedure: IR WITH ANESTHESIA;  Surgeon: Simonne Come, MD;  Location: [facility] OR;  Service: Radiology;  Laterality: N/A;   RADIOLOGY WITH ANESTHESIA Right 07/10/2018   Procedure: Ir With Anesthesia;  Surgeon: Simonne Come, MD;  Location: [facility] OR;  Service: Radiology;  Laterality: Right;   SAVORY DILATION N/A 11/02/2022   Procedure: SAVORY DILATION;  Surgeon: Meridee Score Netty Starring., MD;  Location: [facility];  Service: Gastroenterology;  Laterality: N/A;   SCROTAL EXPLORATION N/A 07/15/2018   Procedure: SCROTUM DEBRIDEMENT;  Surgeon: Marcine Matar, MD;  Location: [facility] OR;  Service: Urology;  Laterality: N/A;   SHOULDER SURGERY     SKIN SPLIT GRAFT Right 09/19/2018   Procedure: Skin Graft Split Thickness;  Surgeon: Peggye Form, DO;  [facility];  Service: Plastics;  Laterality: Right;   SKIN SPLIT GRAFT N/A 10/03/2018   Procedure: Split thickness skin graft to gluteal area with acell placement;  Surgeon: Peggye Form, DO;  [facility];  Service: Plastics;  Laterality: N/A;  3 hours, please   VACUUM ASSISTED CLOSURE CHANGE N/A 07/12/2018   Procedure: ABDOMINAL VACUUM ASSISTED CLOSURE CHANGE and abdominal washout;  Surgeon: Violeta Gelinas, MD;  Location: [facility] OR;  Service: General;  Laterality: N/A;   WOUND DEBRIDEMENT Left 07/23/2018   Procedure: DEBRIDEMENT LEFT BUTTOCK  WOUND;  Surgeon: Violeta Gelinas, MD;  Location: [facility] OR;  Service: General;  Laterality: Left;   WOUND EXPLORATION Left 07/10/2018   Procedure: WOUND EXPLORATION LEFT GROIN;  Surgeon: Berna Bue, MD;  Location: [facility] OR;  Service: General;  Laterality: Left;   Past Surgical History:  Procedure Laterality Date   APPLICATION OF A-CELL OF BACK N/A 08/06/2018   Procedure: Application Of A-Cell Of Back;  Surgeon: Peggye Form, DO;  [facility];  Service:  Plastics;  Laterality: N/A;   APPLICATION OF A-CELL OF EXTREMITY Left 08/06/2018   Procedure: Application  Of A-Cell Of Extremity;  Surgeon: Peggye Form, DO;  [facility];  Service: Plastics;  Laterality: Left;   APPLICATION OF A-CELL OF EXTREMITY Left 09/18/2019   Procedure: APPLICATION OF A-CELL OF EXTREMITY;  Surgeon: Peggye Form, DO;  [facility];  Service: Plastics;  Laterality: Left;   APPLICATION OF WOUND VAC  07/12/2018   Procedure: Application Of Wound Vac to the Left Thigh and Scrotum.;  Surgeon: Roby Lofts, MD;  [facility];  Service: Orthopedics;;   APPLICATION OF WOUND VAC  07/10/2018   Procedure: Application Of Wound Vac;  Surgeon: Berna Bue, MD;  Location: [facility] OR;  Service: General;;   BIOPSY  11/02/2022   Procedure: BIOPSY;  Surgeon: Lemar Lofty., MD;  Location: [facility];  Service: Gastroenterology;;   COLON SURGERY  2020   colostomy   COLOSTOMY N/A 07/23/2018   Procedure: COLOSTOMY;  Surgeon: Violeta Gelinas, MD;  Location: [facility] OR;  Service: General;  Laterality: N/A;   CYSTOSCOPY W/ URETERAL STENT PLACEMENT N/A 07/15/2018   Procedure: RETROGRADE URETHROGRAM;  Surgeon: Marcine Matar, MD;  Location: [facility] OR;  Service: Urology;  Laterality: N/A;   CYSTOSCOPY WITH LITHOLAPAXY N/A 05/06/2019   Procedure: CYSTOSCOPY BASKET BLADDER STONE EXTRACTION;  Surgeon: Malen Gauze, MD;  Location: [facility];  Service: Urology;  Laterality: N/A;  30 MINS   CYSTOSTOMY N/A 05/06/2019   Procedure: REPLACEMENT OF SUPRAPUBIC CATHETER;  Surgeon: Malen Gauze, MD;  Location: [facility];  Service: Urology;  Laterality: N/A;   DEBRIDEMENT AND CLOSURE WOUND Left 03/04/2019   Procedure: Excision of hip wound with placement of Acell;  Surgeon: Peggye Form, DO;  [facility];  Service: Plastics;  Laterality: Left;   ESOPHAGOGASTRODUODENOSCOPY N/A 08/14/2018   Procedure: ESOPHAGOGASTRODUODENOSCOPY (EGD);  Surgeon: Violeta Gelinas, MD;  Location: [facility] ENDOSCOPY;  Service: General;  Laterality: N/A;  bedside    ESOPHAGOGASTRODUODENOSCOPY (EGD) WITH PROPOFOL N/A 11/02/2022   Procedure: ESOPHAGOGASTRODUODENOSCOPY (EGD) WITH PROPOFOL;  Surgeon: Meridee Score Netty Starring., MD;  Location: [facility];  Service: Gastroenterology;  Laterality: N/A;   FACIAL RECONSTRUCTION SURGERY     X 2--once as a teenager and second time in his 56's   HARDWARE REMOVAL Left 03/04/2019   Procedure: Left Hip Hardware Removal;  Surgeon: Roby Lofts, MD;  [facility];  Service: Orthopedics;  Laterality: Left;   HEMOSTASIS CLIP PLACEMENT  11/02/2022   Procedure: HEMOSTASIS CLIP PLACEMENT;  Surgeon: Lemar Lofty., MD;  Location: [facility];  Service: Gastroenterology;;   HIP PINNING,CANNULATED Left 07/12/2018   Procedure: CANNULATED HIP PINNING;  Surgeon: Roby Lofts, MD;  [facility];  Service: Orthopedics;  Laterality: Left;   HIP SURGERY     HOLMIUM LASER APPLICATION Right 07/15/2019   Procedure: HOLMIUM LASER APPLICATION;  Surgeon: Malen Gauze, MD;  Location: [facility];  Service: Urology;  Laterality: Right;   HOT HEMOSTASIS N/A 11/02/2022   Procedure: HOT HEMOSTASIS (ARGON PLASMA COAGULATION/BICAP);  Surgeon: Lemar Lofty., MD;  Location: [facility];  Service: Gastroenterology;  Laterality: N/A;   I & D EXTREMITY Left 07/25/2018   Procedure: Debridement of buttock, scrotum and left leg, placement of acell and vac;  Surgeon: Peggye Form, DO;  [facility];  Service: Plastics;  Laterality: Left;   I & D EXTREMITY N/A 08/06/2018   Procedure: Debridement of buttock,  scrotum and left leg;  Surgeon: Peggye Form, DO;  [facility];  Service: Plastics;  Laterality: N/A;   I & D EXTREMITY N/A 08/13/2018   Procedure: Debridement of buttock, scrotum and left leg, placement of acell and vac;  Surgeon: Peggye Form, DO;  [facility];  Service: Plastics;  Laterality: N/A;  90 min, please   INCISION AND DRAINAGE HIP Left 09/18/2019   Procedure: IRRIGATION  AND DEBRIDEMENT HIP/ PELVIS WITH WOUND VAC PLACEMENT;  Surgeon: Roby Lofts, MD;  [facility];  Service: Orthopedics;  Laterality: Left;   INCISION AND DRAINAGE OF WOUND N/A 07/18/2018   Procedure: Debridement of left leg, buttocks and scrotal wound with placement of acell and Flexiseal;  Surgeon: Peggye Form, DO;  [facility];  Service: Plastics;  Laterality: N/A;   INCISION AND DRAINAGE OF WOUND Left 08/29/2018   Procedure: Debridement of buttock, scrotum and left leg, placement of acell and vac;  Surgeon: Peggye Form, DO;  [facility];  Service: Plastics;  Laterality: Left;  75 min, please   INCISION AND DRAINAGE OF WOUND Bilateral 10/23/2018   Procedure: DEBRIDEMENT OF BUTTOCK,SCROTUM, AND LEG WOUNDS WITH PLACEMENT OF ACELL- BILATERAL 90 MIN;  Surgeon: Peggye Form, DO;  [facility];  Service: Plastics;  Laterality: Bilateral;   IR ANGIOGRAM PELVIS SELECTIVE OR SUPRASELECTIVE  07/10/2018   IR ANGIOGRAM PELVIS SELECTIVE OR SUPRASELECTIVE  07/10/2018   IR ANGIOGRAM SELECTIVE EACH ADDITIONAL VESSEL  07/10/2018   IR EMBO ART  VEN HEMORR LYMPH EXTRAV  INC GUIDE ROADMAPPING  07/10/2018   IR GASTROSTOMY TUBE MOD SED  01/21/2021   IR NEPHROSTOMY PLACEMENT LEFT  04/05/2019   IR NEPHROSTOMY PLACEMENT RIGHT  05/31/2019   IR US GUIDE BX ASP/DRAIN  07/10/2018   IR US GUIDE VASC ACCESS RIGHT  07/10/2018   IR VENO/EXT/UNI LEFT  07/10/2018   IRRIGATION AND DEBRIDEMENT OF WOUND WITH SPLIT THICKNESS SKIN GRAFT Left 09/19/2018   Procedure: Debridement of gluteal wound with placement of acell to left leg.;  Surgeon: Peggye Form, DO;  [facility];  Service: Plastics;  Laterality: Left;  2.5 hours, please   LAPAROTOMY N/A 07/12/2018   Procedure: EXPLORATORY LAPAROTOMY;  Surgeon: Violeta Gelinas, MD;  Location: [facility] OR;  Service: General;  Laterality: N/A;   LAPAROTOMY N/A 07/15/2018   Procedure: WOUND EXPLORATION; CLOSURE OF ABDOMEN;  Surgeon: Violeta Gelinas, MD;   Location: [facility] OR;  Service: General;  Laterality: N/A;   LAPAROTOMY  07/10/2018   Procedure: Exploratory Laparotomy;  Surgeon: Berna Bue, MD;  Location: [facility] OR;  Service: General;;   MASS EXCISION Left 09/18/2019   Procedure: EXCISION UPPER LEFT INNER THIGH WOUND;  Surgeon: Peggye Form, DO;  [facility];  Service: Plastics;  Laterality: Left;   NEPHROLITHOTOMY Right 07/15/2019   Procedure: NEPHROLITHOTOMY PERCUTANEOUS;  Surgeon: Malen Gauze, MD;  Location: [facility];  Service: Urology;  Laterality: Right;  90 MINS   PEG PLACEMENT N/A 08/14/2018   Procedure: PERCUTANEOUS ENDOSCOPIC GASTROSTOMY (PEG) PLACEMENT;  Surgeon: Violeta Gelinas, MD;  Location: [facility] ENDOSCOPY;  Service: General;  Laterality: N/A;   PERCUTANEOUS TRACHEOSTOMY N/A 08/02/2018   Procedure: PERCUTANEOUS TRACHEOSTOMY;  Surgeon: Violeta Gelinas, MD;  Location: [facility] OR;  Service: General;  Laterality: N/A;   RADIOLOGY WITH ANESTHESIA N/A 07/10/2018   Procedure: IR WITH ANESTHESIA;  Surgeon: Simonne Come, MD;  Location: [facility] OR;  Service: Radiology;  Laterality: N/A;   RADIOLOGY WITH ANESTHESIA Right  07/10/2018   Procedure: Ir With Anesthesia;  Surgeon: Simonne Come, MD;  Location: [facility] OR;  Service: Radiology;  Laterality: Right;   SAVORY DILATION N/A 11/02/2022   Procedure: SAVORY DILATION;  Surgeon: Meridee Score Netty Starring., MD;  Location: [facility];  Service: Gastroenterology;  Laterality: N/A;   SCROTAL EXPLORATION N/A 07/15/2018   Procedure: SCROTUM DEBRIDEMENT;  Surgeon: Marcine Matar, MD;  Location: [facility] OR;  Service: Urology;  Laterality: N/A;   SHOULDER SURGERY     SKIN SPLIT GRAFT Right 09/19/2018   Procedure: Skin Graft Split Thickness;  Surgeon: Peggye Form, DO;  [facility];  Service: Plastics;  Laterality: Right;   SKIN SPLIT GRAFT N/A 10/03/2018   Procedure: Split thickness skin graft to gluteal area with acell placement;  Surgeon: Peggye Form, DO;  [facility];   Service: Plastics;  Laterality: N/A;  3 hours, please   VACUUM ASSISTED CLOSURE CHANGE N/A 07/12/2018   Procedure: ABDOMINAL VACUUM ASSISTED CLOSURE CHANGE and abdominal washout;  Surgeon: Violeta Gelinas, MD;  Location: [facility] OR;  Service: General;  Laterality: N/A;   WOUND DEBRIDEMENT Left 07/23/2018   Procedure: DEBRIDEMENT LEFT BUTTOCK  WOUND;  Surgeon: Violeta Gelinas, MD;  Location: [facility] OR;  Service: General;  Laterality: Left;   WOUND EXPLORATION Left 07/10/2018   Procedure: WOUND EXPLORATION LEFT GROIN;  Surgeon: Berna Bue, MD;  [facility];  Service: General;  Laterality: Left;   Past Medical History:  Diagnosis Date   Acute on chronic respiratory failure with hypoxia (HCC) 06/2018   trach removed 11-16-2018, on vent from jan until may 2020 - uses albuterol prn   Anxiety    Bacteremia due to Pseudomonas 06/2018   Chronic osteomyelitis (HCC)    Chronic pain syndrome    Clostridium difficile colitis 10/30/2019   tx with abx    Depression    DVT (deep venous thrombosis) (HCC) 2020   right brachial post PICC line   History of blood transfusion 06/2018   History of Clostridioides difficile colitis    History of kidney stones    Hypertension    norvasc d/c by pcp on 11/05/19   Multiple traumatic injuries    Penile pain 11/18/2019   Pneumonia 11/2009   2020 x 2   Walker as ambulation aid    Wheelchair bound    electric   Wound discharge    left hip wound with bloody/clear drainage change dressing q day surgilube with gauze, between legs wound using calcium algenate pad bid   BP 138/89   Pulse (!) 102   Ht 6\' 3"  (1.905 m)   Wt 194 lb (88 kg) Comment: reported  SpO2 94%   BMI 24.25 kg/m   Opioid Risk Score:   Fall Risk Score:  `1  Depression screen [facility] 2/9     06/12/2023   11:29 AM 06/01/2023    1:21 PM 05/04/2023   10:16 AM 05/01/2023   11:12 AM 05/01/2023   11:08 AM 02/06/2023   10:59 AM 12/26/2022   11:01 AM  Depression screen PHQ 2/9  Decreased Interest 1  2 2   0 1 1  Down, Depressed, Hopeless 1 2 2 1  0 1 1  PHQ - 2 Score 2 4 4 1  0 2 2  Altered sleeping  2 3      Tired, decreased energy  2 2      Change in appetite  2 3      Feeling bad or failure about yourself  2 2      Trouble concentrating  2 2      Moving slowly or fidgety/restless  1 2      Suicidal thoughts  1 0      PHQ-9 Score  16 18      Difficult doing work/chores  Somewhat difficult Very difficult         Review of Systems  All other systems reviewed and are negative.      Objective:   Physical Exam  Awake, but kept falling asleep- accompanied by wife, gets anxious when discussed standing/walking, in power w/c, NAD Wearing O2- by Griffin L forehead and head cuts- healing.      Assessment & Plan:   Pt is a 57 yr old male with hx of Multitrauma- causing L femoral neck fx, degloving of L hip to going/scrotum, bladder neck trauma- got SPC, developed compartment syndrome -s/p surgery for that; also diverting colostomy, skin grafts, and s/p trach and PEG- PEG is out. 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 S/P screw removal and on IV ABX for L hip osteomyelitis. 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  Has R severe ulnar neuropathy as well. O2 3L full time.   Here for f/u on multitrauma and chronic L hip osteomyelitis with associated chronic pain.    Urine drug screen done today per clinic policy.     2. Colonization vs UTI- one is because will always have bacteria- but needs massive overgrowth of bacteria. If color has changed,  become strong smell, drink more water.   3. Symptoms of UTI- can be: urinary pain; confusion; fever, and feeling bad.  Urinary discoloration, odor- that's NOT a UTI.    4. AVOID tylenol.    5.  Pain meds CAN be cause of fatty liver- of contribute to liver issues, but don't have another option  for pain right now other than pain meds.    6. Will refill MSIR 15 mg q3 hours #200 as well as Duragesic 75 mg q2 days- #15.    7.  Usually does preventative Antibiotics when getting UTI's every other month- but not when getting ~ 2x/year, unfortunately.  Already on Cipro for osteomyelits, so wouldn't add another ABX for prevention.   8. Will refill gabapentin 300 mg TID- with 1 year- 3 months of refills.   9. Doesn't need refills of phenergan, Paxil or Imipramine.    10. F/U in 6 weeks- double   11. To start counseling soon ( pt's wife brother going on hospice due to cirrhosis)   12. D/w worker's comp nurse about issues as detailed above.   13. Gas ex- to make ultrasound easier. Take that morning.    I spent a total of  34  minutes on total care today- >50% coordination of care- due to d/w pt and wife about meds- refills; and education on colonization and issues as detailed above.

## 2023-06-13 ENCOUNTER — Ambulatory Visit (HOSPITAL_BASED_OUTPATIENT_CLINIC_OR_DEPARTMENT_OTHER)
Admission: RE | Admit: 2023-06-13 | Discharge: 2023-06-13 | Disposition: A | Discharge status: Home / Self Care | Payer: No Typology Code available for payment source | Source: Ambulatory Visit | Attending: Family Medicine | Admitting: Family Medicine

## 2023-06-13 DIAGNOSIS — R7989 Other specified abnormal findings of blood chemistry: Secondary | ICD-10-CM | POA: Diagnosis present

## 2023-06-13 NOTE — Progress Notes (Signed)
His liver ultrasound is normal.  Do not need to do any other imaging at this point however I would like for him to come back to recheck 1 more set of labs to make sure that his numbers are stable.  Please place future order for CMET and CK.

## 2023-06-15 ENCOUNTER — Other Ambulatory Visit: Payer: Self-pay | Admitting: Internal Medicine

## 2023-06-15 DIAGNOSIS — J4489 Other specified chronic obstructive pulmonary disease: Secondary | ICD-10-CM

## 2023-06-22 ENCOUNTER — Other Ambulatory Visit: Payer: Self-pay | Admitting: Family Medicine

## 2023-06-22 ENCOUNTER — Emergency Department (HOSPITAL_COMMUNITY)
Admission: EM | Admit: 2023-06-22 | Discharge: 2023-06-22 | Disposition: A | Discharge status: Home / Self Care | Payer: PRIVATE HEALTH INSURANCE | Attending: Emergency Medicine | Admitting: Emergency Medicine

## 2023-06-22 ENCOUNTER — Other Ambulatory Visit: Payer: Self-pay

## 2023-06-22 ENCOUNTER — Emergency Department (HOSPITAL_COMMUNITY): Payer: PRIVATE HEALTH INSURANCE

## 2023-06-22 ENCOUNTER — Encounter (HOSPITAL_COMMUNITY): Payer: Self-pay | Admitting: Emergency Medicine

## 2023-06-22 DIAGNOSIS — R0602 Shortness of breath: Secondary | ICD-10-CM | POA: Diagnosis present

## 2023-06-22 DIAGNOSIS — Z20822 Contact with and (suspected) exposure to covid-19: Secondary | ICD-10-CM | POA: Insufficient documentation

## 2023-06-22 DIAGNOSIS — E876 Hypokalemia: Secondary | ICD-10-CM

## 2023-06-22 DIAGNOSIS — J449 Chronic obstructive pulmonary disease, unspecified: Secondary | ICD-10-CM | POA: Diagnosis not present

## 2023-06-22 DIAGNOSIS — R8281 Pyuria: Secondary | ICD-10-CM

## 2023-06-22 DIAGNOSIS — R059 Cough, unspecified: Secondary | ICD-10-CM | POA: Insufficient documentation

## 2023-06-22 DIAGNOSIS — R000 Tachycardia, unspecified: Secondary | ICD-10-CM | POA: Diagnosis not present

## 2023-06-22 DIAGNOSIS — D72829 Elevated white blood cell count, unspecified: Secondary | ICD-10-CM | POA: Insufficient documentation

## 2023-06-22 LAB — COMPREHENSIVE METABOLIC PANEL
ALT: 26 U/L (ref 0–44)
AST: 26 U/L (ref 15–41)
Albumin: 3.4 g/dL — ABNORMAL LOW (ref 3.5–5.0)
Alkaline Phosphatase: 114 U/L (ref 38–126)
Anion gap: 11 (ref 5–15)
BUN: 9 mg/dL (ref 6–20)
CO2: 26 mmol/L (ref 22–32)
Calcium: 9.3 mg/dL (ref 8.9–10.3)
Chloride: 97 mmol/L — ABNORMAL LOW (ref 98–111)
Creatinine, Ser: 0.85 mg/dL (ref 0.61–1.24)
GFR, Estimated: >60 mL/min (ref >60)
Glucose, Bld: 155 mg/dL — ABNORMAL HIGH (ref 70–99)
Potassium: 3.1 mmol/L — ABNORMAL LOW (ref 3.5–5.1)
Sodium: 134 mmol/L — ABNORMAL LOW (ref 135–145)
Total Bilirubin: 0.6 mg/dL (ref 0.0–1.2)
Total Protein: 7.5 g/dL (ref 6.5–8.1)

## 2023-06-22 LAB — URINALYSIS, W/ REFLEX TO CULTURE (INFECTION SUSPECTED)
Bilirubin Urine: NEGATIVE
Glucose, UA: NEGATIVE mg/dL
Ketones, ur: NEGATIVE mg/dL
Nitrite: POSITIVE — AB
Protein, ur: NEGATIVE mg/dL
Specific Gravity, Urine: 1.008 (ref 1.005–1.030)
WBC, UA: >50 WBC/hpf (ref 0–5)
pH: 5 (ref 5.0–8.0)

## 2023-06-22 LAB — CBC WITH DIFFERENTIAL/PLATELET
Abs Immature Granulocytes: 0.05 10*3/uL (ref 0.00–0.07)
Basophils Absolute: 0 10*3/uL (ref 0.0–0.1)
Basophils Relative: 0 %
Eosinophils Absolute: 0 10*3/uL (ref 0.0–0.5)
Eosinophils Relative: 0 %
HCT: 39.1 % (ref 39.0–52.0)
Hemoglobin: 12.5 g/dL — ABNORMAL LOW (ref 13.0–17.0)
Immature Granulocytes: 0 %
Lymphocytes Relative: 3 %
Lymphs Abs: 0.5 10*3/uL — ABNORMAL LOW (ref 0.7–4.0)
MCH: 30.5 pg (ref 26.0–34.0)
MCHC: 32 g/dL (ref 30.0–36.0)
MCV: 95.4 fL (ref 80.0–100.0)
Monocytes Absolute: 0.4 10*3/uL (ref 0.1–1.0)
Monocytes Relative: 3 %
Neutro Abs: 13.4 10*3/uL — ABNORMAL HIGH (ref 1.7–7.7)
Neutrophils Relative %: 94 %
Platelets: 218 10*3/uL (ref 150–400)
RBC: 4.1 MIL/uL — ABNORMAL LOW (ref 4.22–5.81)
RDW: 13.9 % (ref 11.5–15.5)
WBC: 14.4 10*3/uL — ABNORMAL HIGH (ref 4.0–10.5)
nRBC: 0 % (ref 0.0–0.2)

## 2023-06-22 LAB — RESP PANEL BY RT-PCR (RSV, FLU A&B, COVID)  RVPGX2
Influenza A by PCR: NEGATIVE
Influenza B by PCR: NEGATIVE
Resp Syncytial Virus by PCR: NEGATIVE
SARS Coronavirus 2 by RT PCR: NEGATIVE

## 2023-06-22 LAB — I-STAT CG4 LACTIC ACID, ED
Lactic Acid, Venous: 2.1 mmol/L (ref 0.5–1.9)
Lactic Acid, Venous: 1.4 mmol/L (ref 0.5–1.9)

## 2023-06-22 LAB — TROPONIN I (HIGH SENSITIVITY): Troponin I (High Sensitivity): 7 ng/L (ref <18)

## 2023-06-22 LAB — PROTIME-INR
INR: 1.1 (ref 0.8–1.2)
Prothrombin Time: 14.1 s (ref 11.4–15.2)

## 2023-06-22 LAB — APTT: aPTT: 32 s (ref 24–36)

## 2023-06-22 MED ORDER — PREDNISONE 10 MG PO TABS: 40.0000 mg | ORAL_TABLET | Freq: Every day | ORAL | 0 refills | Status: AC

## 2023-06-22 MED ORDER — SODIUM CHLORIDE 0.9 % IV BOLUS
1000.0000 mL | Freq: Once | INTRAVENOUS | Status: AC
  Administered 2023-06-22: 1000 mL via INTRAVENOUS

## 2023-06-22 MED ORDER — IPRATROPIUM BROMIDE 0.02 % IN SOLN
0.5000 mg | Freq: Once | RESPIRATORY_TRACT | Status: AC
  Administered 2023-06-22: 0.5 mg via RESPIRATORY_TRACT
  Filled 2023-06-22: qty 2.5

## 2023-06-22 MED ORDER — ALBUTEROL SULFATE (2.5 MG/3ML) 0.083% IN NEBU
5.0000 mg | INHALATION_SOLUTION | Freq: Once | RESPIRATORY_TRACT | Status: AC
  Administered 2023-06-22: 5 mg via RESPIRATORY_TRACT
  Filled 2023-06-22: qty 6

## 2023-06-22 MED ORDER — METHYLPREDNISOLONE SODIUM SUCC 125 MG IJ SOLR
125.0000 mg | Freq: Once | INTRAMUSCULAR | Status: AC
  Administered 2023-06-22: 125 mg via INTRAVENOUS
  Filled 2023-06-22: qty 2

## 2023-06-22 MED ORDER — POTASSIUM CHLORIDE CRYS ER 20 MEQ PO TBCR
40.0000 meq | EXTENDED_RELEASE_TABLET | Freq: Once | ORAL | Status: AC
  Administered 2023-06-22: 40 meq via ORAL
  Filled 2023-06-22: qty 2

## 2023-06-22 MED ORDER — CEPHALEXIN 500 MG PO CAPS: 500.0000 mg | ORAL_CAPSULE | Freq: Three times a day (TID) | ORAL | 0 refills | Status: AC
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
## 2023-06-22 NOTE — Discharge Instructions (Addendum)
 You have been seen here in the emergency department, for difficulty breathing, we do not think you have a systemic infection at this time but do think you have a urinary tract infection, please take the Keflex  in full, additionally, take the prednisone  to help with your COPD.  If your symptoms worsen at all, please come back to the emergency department.

## 2023-06-22 NOTE — ED Notes (Signed)
Phlebotomy at bedside collecting second set of cultures

## 2023-06-22 NOTE — ED Triage Notes (Signed)
 Pt BIB [facility] due to shortness of breath.  Hx COPD.  Pt coughing up dark brown/black phlegm recently.  Pt normally on 4L oxygen  baseline.  Recent UTI.  Pt does have foley and colostomy bag.  Pt does have left hip wound that was left open by previous provider to drain.  EMS VS BP 130/80, HR 122
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
## 2023-06-24 LAB — URINE CULTURE: Culture: >=100000 — AB

## 2023-06-25 ENCOUNTER — Telehealth (HOSPITAL_BASED_OUTPATIENT_CLINIC_OR_DEPARTMENT_OTHER): Payer: Self-pay | Admitting: *Deleted

## 2023-06-25 NOTE — Telephone Encounter (Signed)
 Post ED Visit - Positive Culture Follow-up  Culture report reviewed by antimicrobial stewardship pharmacist: [facility] [address] Pharm.D. []  Venetia Gully, Pharm.D., BCPS AQ-ID []  Garrel Crews, Pharm.D., BCPS []  Almarie Lunger, Pharm.D., BCPS []  [place], [place].D., BCPS, AAHIVP []  Rosaline Bihari, Pharm.D., BCPS, AAHIVP []  Vernell Meier, PharmD, BCPS []  Latanya Hint, PharmD, BCPS []  Donald Medley, PharmD, BCPS []  Rocky Bold, PharmD []  Dorothyann Alert, PharmD, BCPS [x]  Dorn Poot, PharmD  [facility] []  Rosaline Edison, PharmD []  Romona Bliss, PharmD []  Dolphus Roller, PharmD []  Veva Seip, Rph []  Vernell Daunt) Leonce, PharmD []  Eva Allis, PharmD []  Rosaline Millet, PharmD []  Iantha Batch, PharmD []  Arvin Gauss, PharmD []  Wanda Hasting, PharmD []  Ronal Rav, PharmD []  Rocky Slade, PharmD []  Bard Jeans, PharmD   Positive urine culture Treated with Cephalexin , organism sensitive to the same and no further patient follow-up is required at this time.  Christopher [name] 06/25/2023, 10:51 AM

## 2023-06-26 ENCOUNTER — Telehealth: Payer: Self-pay

## 2023-06-26 ENCOUNTER — Other Ambulatory Visit: Payer: Self-pay | Admitting: Internal Medicine

## 2023-06-26 ENCOUNTER — Other Ambulatory Visit (HOSPITAL_COMMUNITY): Payer: Self-pay

## 2023-06-26 DIAGNOSIS — J4489 Other specified chronic obstructive pulmonary disease: Secondary | ICD-10-CM

## 2023-06-26 NOTE — Telephone Encounter (Signed)
 We are unable to do PA through [employer]. Patient may want to reach out to coverage to see what is covered.

## 2023-06-26 NOTE — Telephone Encounter (Signed)
 Pt wife, Jearld Lesch (DPR), stated that everything is ran through his workers comp.

## 2023-06-26 NOTE — Telephone Encounter (Signed)
 Pt wife Christopher [name] needs this to be looked at once it is approved so our office can send it back to the pharmacy for refills. Please advise for this.

## 2023-06-26 NOTE — Telephone Encounter (Signed)
 Last refill is dated for 06-26-23.  Pharmacy comment: Alternative Requested:PA.

## 2023-06-26 NOTE — Telephone Encounter (Signed)
 I spoke to pts wife, Jearld Lesch, and I informed her of the message. [name] states she will contact the nurse that can approve of this through the workers comp and she will contact our office. NFN.

## 2023-06-26 NOTE — Telephone Encounter (Signed)
 Copy of current insurance card is needed-card on file expired in 2019

## 2023-06-27 LAB — CULTURE, BLOOD (ROUTINE X 2)
Culture: NO GROWTH
Culture: NO GROWTH
Special Requests: ADEQUATE
Special Requests: ADEQUATE

## 2023-06-27 NOTE — Telephone Encounter (Signed)
 We will not receive notification of approval since we are not associated with the Workers Comp processes.

## 2023-06-27 NOTE — Telephone Encounter (Signed)
 A user error has taken place: encounter opened in error, closed for administrative reasons.

## 2023-06-28 ENCOUNTER — Telehealth: Payer: Self-pay | Admitting: Family Medicine

## 2023-06-28 NOTE — Telephone Encounter (Signed)
 [facility] faxed document Home Health Certificate (Order ID [id number]), to be filled out by provider. Patient requested to send it back via Fax within 5-days. Document is located in providers tray at front office.Please advise at  [phone].

## 2023-06-29 DIAGNOSIS — D649 Anemia, unspecified: Secondary | ICD-10-CM

## 2023-06-29 DIAGNOSIS — F419 Anxiety disorder, unspecified: Secondary | ICD-10-CM

## 2023-06-29 DIAGNOSIS — G4700 Insomnia, unspecified: Secondary | ICD-10-CM

## 2023-06-29 DIAGNOSIS — E43 Unspecified severe protein-calorie malnutrition: Secondary | ICD-10-CM | POA: Diagnosis not present

## 2023-06-29 DIAGNOSIS — I700 Atherosclerosis of aorta: Secondary | ICD-10-CM

## 2023-06-29 DIAGNOSIS — Z933 Colostomy status: Secondary | ICD-10-CM

## 2023-06-29 DIAGNOSIS — R1310 Dysphagia, unspecified: Secondary | ICD-10-CM

## 2023-06-29 DIAGNOSIS — G894 Chronic pain syndrome: Secondary | ICD-10-CM

## 2023-06-29 DIAGNOSIS — I10 Essential (primary) hypertension: Secondary | ICD-10-CM | POA: Diagnosis not present

## 2023-06-29 DIAGNOSIS — G8220 Paraplegia, unspecified: Secondary | ICD-10-CM | POA: Diagnosis not present

## 2023-06-29 DIAGNOSIS — Z435 Encounter for attention to cystostomy: Secondary | ICD-10-CM | POA: Diagnosis not present

## 2023-06-29 DIAGNOSIS — F1120 Opioid dependence, uncomplicated: Secondary | ICD-10-CM

## 2023-06-29 NOTE — Telephone Encounter (Signed)
Form placed in PCP office to be reviewed  

## 2023-07-02 ENCOUNTER — Other Ambulatory Visit: Payer: Self-pay | Admitting: Family Medicine

## 2023-07-03 NOTE — Telephone Encounter (Signed)
Form faxed to [phone] Form placed to be scan in patient chart

## 2023-07-05 ENCOUNTER — Encounter
Payer: No Typology Code available for payment source | Attending: Physical Medicine and Rehabilitation | Admitting: Physical Medicine and Rehabilitation

## 2023-07-05 ENCOUNTER — Encounter: Payer: Self-pay | Admitting: Physical Medicine and Rehabilitation

## 2023-07-05 VITALS — BP 124/82 | HR 100 | Ht 75.0 in

## 2023-07-05 DIAGNOSIS — R413 Other amnesia: Secondary | ICD-10-CM | POA: Diagnosis not present

## 2023-07-05 DIAGNOSIS — M792 Neuralgia and neuritis, unspecified: Secondary | ICD-10-CM | POA: Insufficient documentation

## 2023-07-05 DIAGNOSIS — Z9359 Other cystostomy status: Secondary | ICD-10-CM | POA: Diagnosis present

## 2023-07-05 DIAGNOSIS — K9400 Colostomy complication, unspecified: Secondary | ICD-10-CM | POA: Insufficient documentation

## 2023-07-05 DIAGNOSIS — F321 Major depressive disorder, single episode, moderate: Secondary | ICD-10-CM | POA: Diagnosis present

## 2023-07-05 DIAGNOSIS — M8668 Other chronic osteomyelitis, other site: Secondary | ICD-10-CM | POA: Insufficient documentation

## 2023-07-05 DIAGNOSIS — Z993 Dependence on wheelchair: Secondary | ICD-10-CM | POA: Insufficient documentation

## 2023-07-05 NOTE — Progress Notes (Signed)
Subjective:    Patient ID: Christopher [name], male    DOB: 20-Sep-1965, 58 y.o.   MRN: [id number]  HPI Pt is a 58 yr old male with hx of Multitrauma- causing L femoral neck fx, degloving of L hip to going/scrotum, bladder neck trauma- got SPC, developed compartment syndrome -s/p surgery for that; also diverting colostomy, skin grafts, and s/p trach and PEG- PEG is out. 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 S/P screw removal and on IV ABX for L hip osteomyelitis. 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  Has R severe ulnar neuropathy as well. O2 3L full time.   Here for f/u on multitrauma and chronic L hip osteomyelitis with associated chronic pain.    Was coughing up black/brown phlegm- also had sinus tach as well. Wasn't dx'd with pneumonia- but sounds like had bronchitis.  Was real SOB, but O2 sats OK- was given 2 breathing tx's-  and UTI- was given shots /steroids-  and sent home with Keflex for UTI as well as prednisone pills.  Was a little confused, but not half as bad as last time.     Pain ~ 7/10- as usual- maybe slightly better.   Because he's on another ABX, Urology doesn't want to put him on prophylaxis ABX  to prevent UTI's.   Has to pick up meds today. - Pain meds ready.  Urine Cx sensitive to Cephalosporins- so was given correct ABX.   Still waiting on full length bed rails- won't fit his bed- need whole new bed- no way to hook additional rails to current hospital bed.   Waiting on joystick for back of w/c- workman's comp- W/C  from [employer]- is now [employer].    Counseling not started yet.  Still waiting to hear.    Liver u/s was fine except fatty liver.     Pain Inventory Average Pain 7 Pain Right Now 7 My pain is constant and aching  In the last 24 hours, has pain interfered with the following? General activity 4 Relation with others 4 Enjoyment of life 4 What TIME of day is your pain at its worst? morning , daytime, evening, and night Sleep (in general) Good  Pain is worse with: some activites Pain improves with: rest and medication Relief from Meds: 8  Family History  Problem Relation Age of Onset   Breast cancer Mother        with mets to the bones   Social History   Socioeconomic History   Marital status: Married    Spouse name: Not on file   Number of children: Not on file   Years of education: Not on file   Highest education level: Not on file  Occupational History   Occupation: Disable  Tobacco Use   Smoking status: Former    Current packs/day: 0.00    Average packs/day: 1 pack/day for 20.0 years (20.0 ttl pk-yrs)    Types: Cigarettes    Start date: 07/10/1998    Quit date: 07/10/2018    Years since  quitting: 4.9   Smokeless tobacco: Never  Vaping Use   Vaping status: Never Used  Substance and Sexual Activity   Alcohol use: Never   Drug use: Yes    Types: Oxycodone, Fentanyl    Comment: Fentanyl patch/oxycodone since 06/2018   Sexual activity: Yes  Other Topics Concern   Not on file  Social History Narrative   ** Merged History Encounter **       Social Drivers of Health   Financial Resource Strain: Low Risk  (03/01/2019)   Overall Financial Resource Strain (CARDIA)    Difficulty of Paying Living Expenses: Not very hard  Food Insecurity: No Food Insecurity (05/17/2023)   Hunger Vital Sign    Worried About Running Out of Food in the Last Year: Never true    Ran Out of Food in the Last Year: Never true  Recent Concern: Food Insecurity - Food Insecurity Present  (05/16/2023)   Hunger Vital Sign    Worried About Running Out of Food in the Last Year: Sometimes true    Ran Out of Food in the Last Year: Sometimes true  Transportation Needs: No Transportation Needs (05/16/2023)   PRAPARE - Administrator, Civil Service (Medical): No    Lack of Transportation (Non-Medical): No  Physical Activity: Inactive (03/01/2019)   Exercise Vital Sign    Days of Exercise per Week: 0 days    Minutes of Exercise per Session: 0 min  Stress: Stress Concern Present (03/01/2019)   [employer] of Occupational Health - Occupational Stress Questionnaire    Feeling of Stress : Rather much  Social Connections: Not on file   Past Surgical History:  Procedure Laterality Date   APPLICATION OF A-CELL OF BACK N/A 08/06/2018   Procedure: Application Of A-Cell Of Back;  Surgeon: Peggye Form, DO;  [facility];  Service: Plastics;  Laterality: N/A;   APPLICATION OF A-CELL OF EXTREMITY Left 08/06/2018   Procedure: Application Of A-Cell Of Extremity;  Surgeon: Peggye Form, DO;  [facility];  Service: Plastics;  Laterality: Left;   APPLICATION OF A-CELL OF EXTREMITY Left 09/18/2019   Procedure: APPLICATION OF A-CELL OF EXTREMITY;  Surgeon: Peggye Form, DO;  [facility];  Service: Plastics;  Laterality: Left;   APPLICATION OF WOUND VAC  07/12/2018   Procedure: Application Of Wound Vac to the Left Thigh and Scrotum.;  Surgeon: Roby Lofts, MD;  [facility];  Service: Orthopedics;;   APPLICATION OF WOUND VAC  07/10/2018   Procedure: Application Of Wound Vac;  Surgeon: Berna Bue, MD;  Location: [facility] OR;  Service: General;;   BIOPSY  11/02/2022   Procedure: BIOPSY;  Surgeon: Lemar Lofty., MD;  Location: [facility];  Service: Gastroenterology;;   COLON SURGERY  2020   colostomy   COLOSTOMY N/A 07/23/2018   Procedure: COLOSTOMY;  Surgeon: Violeta Gelinas, MD;  Location: [facility] OR;  Service: General;  Laterality: N/A;    CYSTOSCOPY W/ URETERAL STENT PLACEMENT N/A 07/15/2018   Procedure: RETROGRADE URETHROGRAM;  Surgeon: Marcine Matar, MD;  Location: [facility] OR;  Service: Urology;  Laterality: N/A;   CYSTOSCOPY WITH LITHOLAPAXY N/A 05/06/2019   Procedure: CYSTOSCOPY BASKET BLADDER STONE EXTRACTION;  Surgeon: Malen Gauze, MD;  Location: [facility];  Service: Urology;  Laterality: N/A;  30 MINS   CYSTOSTOMY N/A 05/06/2019   Procedure: REPLACEMENT OF SUPRAPUBIC CATHETER;  Surgeon: Malen Gauze, MD;  Location: [facility];  Service: Urology;  Laterality: N/A;   DEBRIDEMENT AND CLOSURE WOUND Left 03/04/2019   Procedure: Excision of hip wound with placement of Acell;  Surgeon: Peggye Form, DO;  [facility];  Service: Plastics;  Laterality: Left;   ESOPHAGOGASTRODUODENOSCOPY N/A 08/14/2018   Procedure: ESOPHAGOGASTRODUODENOSCOPY (EGD);  Surgeon: Violeta Gelinas, MD;  Location: [facility] ENDOSCOPY;  Service: General;  Laterality: N/A;  bedside   ESOPHAGOGASTRODUODENOSCOPY (EGD) WITH PROPOFOL N/A 11/02/2022   Procedure: ESOPHAGOGASTRODUODENOSCOPY (EGD) WITH PROPOFOL;  Surgeon: Meridee Score Netty Starring., MD;  Location: [facility];  Service: Gastroenterology;  Laterality: N/A;   FACIAL RECONSTRUCTION SURGERY     X 2--once as a teenager and second time in his 89's   HARDWARE REMOVAL Left 03/04/2019   Procedure: Left Hip Hardware Removal;  Surgeon: Roby Lofts, MD;  [facility];  Service: Orthopedics;  Laterality: Left;   HEMOSTASIS CLIP PLACEMENT  11/02/2022   Procedure: HEMOSTASIS CLIP PLACEMENT;  Surgeon: Lemar Lofty., MD;  Location: [facility];  Service: Gastroenterology;;   HIP PINNING,CANNULATED Left 07/12/2018   Procedure: CANNULATED HIP PINNING;  Surgeon: Roby Lofts, MD;  [facility];  Service: Orthopedics;  Laterality: Left;   HIP SURGERY     HOLMIUM LASER APPLICATION Right 07/15/2019   Procedure: HOLMIUM LASER APPLICATION;  Surgeon: Malen Gauze, MD;  Location: [facility];  Service: Urology;  Laterality: Right;   HOT HEMOSTASIS N/A 11/02/2022   Procedure: HOT HEMOSTASIS (ARGON PLASMA COAGULATION/BICAP);  Surgeon: Lemar Lofty., MD;  Location: [facility];  Service: Gastroenterology;  Laterality: N/A;   I & D EXTREMITY Left 07/25/2018   Procedure: Debridement of buttock, scrotum and left leg, placement of acell and vac;  Surgeon: Peggye Form, DO;  [facility];  Service: Plastics;  Laterality: Left;   I & D EXTREMITY N/A 08/06/2018   Procedure: Debridement of buttock, scrotum and left leg;  Surgeon: Peggye Form, DO;  [facility];  Service: Plastics;  Laterality: N/A;   I & D EXTREMITY N/A 08/13/2018   Procedure: Debridement of buttock, scrotum and left leg, placement of acell and vac;  Surgeon: Peggye Form, DO;  [facility];  Service: Plastics;  Laterality: N/A;  90 min, please   INCISION AND DRAINAGE HIP Left 09/18/2019   Procedure: IRRIGATION AND DEBRIDEMENT HIP/ PELVIS WITH WOUND VAC PLACEMENT;  Surgeon: Roby Lofts, MD;  [facility];  Service: Orthopedics;  Laterality: Left;   INCISION AND DRAINAGE OF WOUND N/A 07/18/2018   Procedure: Debridement of left leg, buttocks and scrotal wound with placement of acell and Flexiseal;  Surgeon: Peggye Form, DO;  [facility];  Service: Plastics;  Laterality: N/A;   INCISION AND DRAINAGE OF WOUND Left 08/29/2018   Procedure: Debridement of buttock, scrotum and left leg, placement of acell and vac;  Surgeon: Peggye Form, DO;  [facility];  Service: Plastics;  Laterality: Left;  75 min, please   INCISION AND DRAINAGE OF WOUND Bilateral 10/23/2018   Procedure: DEBRIDEMENT OF BUTTOCK,SCROTUM, AND LEG WOUNDS WITH PLACEMENT OF ACELL- BILATERAL 90 MIN;  Surgeon: Peggye Form, DO;  [facility];  Service: Plastics;  Laterality: Bilateral;   IR ANGIOGRAM PELVIS SELECTIVE OR SUPRASELECTIVE  07/10/2018    IR ANGIOGRAM PELVIS SELECTIVE OR SUPRASELECTIVE  07/10/2018   IR ANGIOGRAM SELECTIVE EACH ADDITIONAL VESSEL  07/10/2018   IR EMBO ART  VEN HEMORR LYMPH EXTRAV  INC GUIDE ROADMAPPING  07/10/2018   IR GASTROSTOMY TUBE MOD SED  01/21/2021   IR NEPHROSTOMY PLACEMENT LEFT  04/05/2019   IR NEPHROSTOMY PLACEMENT RIGHT  05/31/2019   IR US GUIDE BX ASP/DRAIN  07/10/2018   IR US GUIDE VASC ACCESS RIGHT  07/10/2018   IR VENO/EXT/UNI LEFT  07/10/2018   IRRIGATION AND DEBRIDEMENT OF WOUND WITH SPLIT THICKNESS SKIN GRAFT Left 09/19/2018   Procedure: Debridement of gluteal wound with placement of acell to left leg.;  Surgeon: Peggye Form, DO;  [facility];  Service: Plastics;  Laterality: Left;  2.5 hours, please   LAPAROTOMY N/A 07/12/2018   Procedure: EXPLORATORY LAPAROTOMY;  Surgeon: Violeta Gelinas, MD;  Location: [facility] OR;  Service: General;  Laterality: N/A;   LAPAROTOMY N/A 07/15/2018   Procedure: WOUND EXPLORATION; CLOSURE OF ABDOMEN;  Surgeon: Violeta Gelinas, MD;  Location: [facility] OR;  Service: General;  Laterality: N/A;   LAPAROTOMY  07/10/2018   Procedure: Exploratory Laparotomy;  Surgeon: Berna Bue, MD;  Location: [facility] OR;  Service: General;;   MASS EXCISION Left 09/18/2019   Procedure: EXCISION UPPER LEFT INNER THIGH WOUND;  Surgeon: Peggye Form, DO;  [facility];  Service: Plastics;  Laterality: Left;   NEPHROLITHOTOMY Right 07/15/2019   Procedure: NEPHROLITHOTOMY PERCUTANEOUS;  Surgeon: Malen Gauze, MD;  Location: [facility];  Service: Urology;  Laterality: Right;  90 MINS   PEG PLACEMENT N/A 08/14/2018   Procedure: PERCUTANEOUS ENDOSCOPIC GASTROSTOMY (PEG) PLACEMENT;  Surgeon: Violeta Gelinas, MD;  Location: [facility] ENDOSCOPY;  Service: General;  Laterality: N/A;   PERCUTANEOUS TRACHEOSTOMY N/A 08/02/2018   Procedure: PERCUTANEOUS TRACHEOSTOMY;  Surgeon: Violeta Gelinas, MD;  Location: [facility] OR;  Service: General;  Laterality: N/A;   RADIOLOGY WITH ANESTHESIA N/A  07/10/2018   Procedure: IR WITH ANESTHESIA;  Surgeon: Simonne Come, MD;  Location: [facility] OR;  Service: Radiology;  Laterality: N/A;   RADIOLOGY WITH ANESTHESIA Right 07/10/2018   Procedure: Ir With Anesthesia;  Surgeon: Simonne Come, MD;  Location: [facility] OR;  Service: Radiology;  Laterality: Right;   SAVORY DILATION N/A 11/02/2022   Procedure: SAVORY DILATION;  Surgeon: Meridee Score Netty Starring., MD;  Location: [facility];  Service: Gastroenterology;  Laterality: N/A;   SCROTAL EXPLORATION N/A 07/15/2018   Procedure: SCROTUM DEBRIDEMENT;  Surgeon: Marcine Matar, MD;  [facility] OR;  Service: Urology;  Laterality: N/A;   SHOULDER SURGERY     SKIN SPLIT GRAFT Right 09/19/2018   Procedure: Skin Graft Split Thickness;  Surgeon: Peggye Form, DO;  [facility];  Service: Plastics;  Laterality: Right;   SKIN SPLIT GRAFT N/A 10/03/2018   Procedure: Split thickness skin graft to gluteal area with acell placement;  Surgeon: Peggye Form, DO;  [facility];  Service: Plastics;  Laterality: N/A;  3 hours, please   VACUUM ASSISTED CLOSURE CHANGE N/A 07/12/2018   Procedure: ABDOMINAL VACUUM ASSISTED CLOSURE CHANGE and abdominal washout;  Surgeon: Violeta Gelinas, MD;  Location: [facility] OR;  Service: General;  Laterality: N/A;   WOUND DEBRIDEMENT Left 07/23/2018   Procedure: DEBRIDEMENT LEFT BUTTOCK  WOUND;  Surgeon: Violeta Gelinas, MD;  Location: [facility] OR;  Service: General;  Laterality: Left;   WOUND EXPLORATION Left 07/10/2018   Procedure: WOUND EXPLORATION LEFT GROIN;  Surgeon: Berna Bue, MD;  Location: [facility] OR;  Service: General;  Laterality: Left;   Past Surgical History:  Procedure Laterality Date   APPLICATION OF A-CELL OF BACK N/A 08/06/2018   Procedure: Application Of A-Cell Of Back;  Surgeon: Peggye Form, DO;  [facility];  Service:  Plastics;  Laterality: N/A;   APPLICATION OF A-CELL OF EXTREMITY Left 08/06/2018   Procedure: Application Of A-Cell Of Extremity;  Surgeon:  Peggye Form, DO;  [facility];  Service: Plastics;  Laterality: Left;   APPLICATION OF A-CELL OF EXTREMITY Left 09/18/2019   Procedure: APPLICATION OF A-CELL OF EXTREMITY;  Surgeon: Peggye Form, DO;  [facility];  Service: Plastics;  Laterality: Left;   APPLICATION OF WOUND VAC  07/12/2018   Procedure: Application Of Wound Vac to the Left Thigh and Scrotum.;  Surgeon: Roby Lofts, MD;  [facility];  Service: Orthopedics;;   APPLICATION OF WOUND VAC  07/10/2018   Procedure: Application Of Wound Vac;  Surgeon: Berna Bue, MD;  Location: [facility] OR;  Service: General;;   BIOPSY  11/02/2022   Procedure: BIOPSY;  Surgeon: Lemar Lofty., MD;  Location: [facility];  Service: Gastroenterology;;   COLON SURGERY  2020   colostomy   COLOSTOMY N/A 07/23/2018   Procedure: COLOSTOMY;  Surgeon: Violeta Gelinas, MD;  Location: [facility] OR;  Service: General;  Laterality: N/A;   CYSTOSCOPY W/ URETERAL STENT PLACEMENT N/A 07/15/2018   Procedure: RETROGRADE URETHROGRAM;  Surgeon: Marcine Matar, MD;  Location: [facility] OR;  Service: Urology;  Laterality: N/A;   CYSTOSCOPY WITH LITHOLAPAXY N/A 05/06/2019   Procedure: CYSTOSCOPY BASKET BLADDER STONE EXTRACTION;  Surgeon: Malen Gauze, MD;  Location: [facility];  Service: Urology;  Laterality: N/A;  30 MINS   CYSTOSTOMY N/A 05/06/2019   Procedure: REPLACEMENT OF SUPRAPUBIC CATHETER;  Surgeon: Malen Gauze, MD;  Location: [facility];  Service: Urology;  Laterality: N/A;   DEBRIDEMENT AND CLOSURE WOUND Left 03/04/2019   Procedure: Excision of hip wound with placement of Acell;  Surgeon: Peggye Form, DO;  [facility];  Service: Plastics;  Laterality: Left;   ESOPHAGOGASTRODUODENOSCOPY N/A 08/14/2018   Procedure: ESOPHAGOGASTRODUODENOSCOPY (EGD);  Surgeon: Violeta Gelinas, MD;  Location: [facility] ENDOSCOPY;  Service: General;  Laterality: N/A;  bedside   ESOPHAGOGASTRODUODENOSCOPY (EGD)  WITH PROPOFOL N/A 11/02/2022   Procedure: ESOPHAGOGASTRODUODENOSCOPY (EGD) WITH PROPOFOL;  Surgeon: Meridee Score Netty Starring., MD;  Location: [facility];  Service: Gastroenterology;  Laterality: N/A;   FACIAL RECONSTRUCTION SURGERY     X 2--once as a teenager and second time in his 60's   HARDWARE REMOVAL Left 03/04/2019   Procedure: Left Hip Hardware Removal;  Surgeon: Roby Lofts, MD;  [facility];  Service: Orthopedics;  Laterality: Left;   HEMOSTASIS CLIP PLACEMENT  11/02/2022   Procedure: HEMOSTASIS CLIP PLACEMENT;  Surgeon: Lemar Lofty., MD;  Location: [facility];  Service: Gastroenterology;;   HIP PINNING,CANNULATED Left 07/12/2018   Procedure: CANNULATED HIP PINNING;  Surgeon: Roby Lofts, MD;  [facility];  Service: Orthopedics;  Laterality: Left;   HIP SURGERY     HOLMIUM LASER APPLICATION Right 07/15/2019   Procedure: HOLMIUM LASER APPLICATION;  Surgeon: Malen Gauze, MD;  Location: [facility];  Service: Urology;  Laterality: Right;   HOT HEMOSTASIS N/A 11/02/2022   Procedure: HOT HEMOSTASIS (ARGON PLASMA COAGULATION/BICAP);  Surgeon: Lemar Lofty., MD;  Location: [facility];  Service: Gastroenterology;  Laterality: N/A;   I & D EXTREMITY Left 07/25/2018   Procedure: Debridement of buttock, scrotum and left leg, placement of acell and vac;  Surgeon: Peggye Form, DO;  [facility];  Service: Plastics;  Laterality: Left;   I & D EXTREMITY N/A 08/06/2018   Procedure: Debridement of buttock,  scrotum and left leg;  Surgeon: Peggye Form, DO;  [facility];  Service: Plastics;  Laterality: N/A;   I & D EXTREMITY N/A 08/13/2018   Procedure: Debridement of buttock, scrotum and left leg, placement of acell and vac;  Surgeon: Peggye Form, DO;  [facility];  Service: Plastics;  Laterality: N/A;  90 min, please   INCISION AND DRAINAGE HIP Left 09/18/2019   Procedure: IRRIGATION AND DEBRIDEMENT HIP/ PELVIS WITH  WOUND VAC PLACEMENT;  Surgeon: Roby Lofts, MD;  [facility];  Service: Orthopedics;  Laterality: Left;   INCISION AND DRAINAGE OF WOUND N/A 07/18/2018   Procedure: Debridement of left leg, buttocks and scrotal wound with placement of acell and Flexiseal;  Surgeon: Peggye Form, DO;  [facility];  Service: Plastics;  Laterality: N/A;   INCISION AND DRAINAGE OF WOUND Left 08/29/2018   Procedure: Debridement of buttock, scrotum and left leg, placement of acell and vac;  Surgeon: Peggye Form, DO;  [facility];  Service: Plastics;  Laterality: Left;  75 min, please   INCISION AND DRAINAGE OF WOUND Bilateral 10/23/2018   Procedure: DEBRIDEMENT OF BUTTOCK,SCROTUM, AND LEG WOUNDS WITH PLACEMENT OF ACELL- BILATERAL 90 MIN;  Surgeon: Peggye Form, DO;  [facility];  Service: Plastics;  Laterality: Bilateral;   IR ANGIOGRAM PELVIS SELECTIVE OR SUPRASELECTIVE  07/10/2018   IR ANGIOGRAM PELVIS SELECTIVE OR SUPRASELECTIVE  07/10/2018   IR ANGIOGRAM SELECTIVE EACH ADDITIONAL VESSEL  07/10/2018   IR EMBO ART  VEN HEMORR LYMPH EXTRAV  INC GUIDE ROADMAPPING  07/10/2018   IR GASTROSTOMY TUBE MOD SED  01/21/2021   IR NEPHROSTOMY PLACEMENT LEFT  04/05/2019   IR NEPHROSTOMY PLACEMENT RIGHT  05/31/2019   IR US GUIDE BX ASP/DRAIN  07/10/2018   IR US GUIDE VASC ACCESS RIGHT  07/10/2018   IR VENO/EXT/UNI LEFT  07/10/2018   IRRIGATION AND DEBRIDEMENT OF WOUND WITH SPLIT THICKNESS SKIN GRAFT Left 09/19/2018   Procedure: Debridement of gluteal wound with placement of acell to left leg.;  Surgeon: Peggye Form, DO;  [facility];  Service: Plastics;  Laterality: Left;  2.5 hours, please   LAPAROTOMY N/A 07/12/2018   Procedure: EXPLORATORY LAPAROTOMY;  Surgeon: Violeta Gelinas, MD;  Location: [facility] OR;  Service: General;  Laterality: N/A;   LAPAROTOMY N/A 07/15/2018   Procedure: WOUND EXPLORATION; CLOSURE OF ABDOMEN;  Surgeon: Violeta Gelinas, MD;  Location: [facility];  Service: General;   Laterality: N/A;   LAPAROTOMY  07/10/2018   Procedure: Exploratory Laparotomy;  Surgeon: Berna Bue, MD;  Location: [facility] OR;  Service: General;;   MASS EXCISION Left 09/18/2019   Procedure: EXCISION UPPER LEFT INNER THIGH WOUND;  Surgeon: Peggye Form, DO;  [facility];  Service: Plastics;  Laterality: Left;   NEPHROLITHOTOMY Right 07/15/2019   Procedure: NEPHROLITHOTOMY PERCUTANEOUS;  Surgeon: Malen Gauze, MD;  Location: [facility];  Service: Urology;  Laterality: Right;  90 MINS   PEG PLACEMENT N/A 08/14/2018   Procedure: PERCUTANEOUS ENDOSCOPIC GASTROSTOMY (PEG) PLACEMENT;  Surgeon: Violeta Gelinas, MD;  Location: [facility] ENDOSCOPY;  Service: General;  Laterality: N/A;   PERCUTANEOUS TRACHEOSTOMY N/A 08/02/2018   Procedure: PERCUTANEOUS TRACHEOSTOMY;  Surgeon: Violeta Gelinas, MD;  Location: [facility] OR;  Service: General;  Laterality: N/A;   RADIOLOGY WITH ANESTHESIA N/A 07/10/2018   Procedure: IR WITH ANESTHESIA;  Surgeon: Simonne Come, MD;  Location: [facility] OR;  Service: Radiology;  Laterality: N/A;   RADIOLOGY WITH ANESTHESIA Right  07/10/2018   Procedure: Ir With Anesthesia;  Surgeon: Simonne Come, MD;  Location: [facility] OR;  Service: Radiology;  Laterality: Right;   SAVORY DILATION N/A 11/02/2022   Procedure: SAVORY DILATION;  Surgeon: Meridee Score Netty Starring., MD;  Location: [facility];  Service: Gastroenterology;  Laterality: N/A;   SCROTAL EXPLORATION N/A 07/15/2018   Procedure: SCROTUM DEBRIDEMENT;  Surgeon: Marcine Matar, MD;  Location: [facility] OR;  Service: Urology;  Laterality: N/A;   SHOULDER SURGERY     SKIN SPLIT GRAFT Right 09/19/2018   Procedure: Skin Graft Split Thickness;  Surgeon: Peggye Form, DO;  [facility];  Service: Plastics;  Laterality: Right;   SKIN SPLIT GRAFT N/A 10/03/2018   Procedure: Split thickness skin graft to gluteal area with acell placement;  Surgeon: Peggye Form, DO;  [facility];  Service: Plastics;  Laterality: N/A;  3  hours, please   VACUUM ASSISTED CLOSURE CHANGE N/A 07/12/2018   Procedure: ABDOMINAL VACUUM ASSISTED CLOSURE CHANGE and abdominal washout;  Surgeon: Violeta Gelinas, MD;  Location: [facility] OR;  Service: General;  Laterality: N/A;   WOUND DEBRIDEMENT Left 07/23/2018   Procedure: DEBRIDEMENT LEFT BUTTOCK  WOUND;  Surgeon: Violeta Gelinas, MD;  Location: [facility] OR;  Service: General;  Laterality: Left;   WOUND EXPLORATION Left 07/10/2018   Procedure: WOUND EXPLORATION LEFT GROIN;  Surgeon: Berna Bue, MD;  [facility];  Service: General;  Laterality: Left;   Past Medical History:  Diagnosis Date   Acute on chronic respiratory failure with hypoxia (HCC) 06/2018   trach removed 11-16-2018, on vent from jan until may 2020 - uses albuterol prn   Anxiety    Bacteremia due to Pseudomonas 06/2018   Chronic osteomyelitis (HCC)    Chronic pain syndrome    Clostridium difficile colitis 10/30/2019   tx with abx    Depression    DVT (deep venous thrombosis) (HCC) 2020   right brachial post PICC line   History of blood transfusion 06/2018   History of Clostridioides difficile colitis    History of kidney stones    Hypertension    norvasc d/c by pcp on 11/05/19   Multiple traumatic injuries    Penile pain 11/18/2019   Pneumonia 11/2009   2020 x 2   Walker as ambulation aid    Wheelchair bound    electric   Wound discharge    left hip wound with bloody/clear drainage change dressing q day surgilube with gauze, between legs wound using calcium algenate pad bid   Ht 6\' 3"  (1.905 m)   BMI 26.87 kg/m   Opioid Risk Score:   Fall Risk Score:  `1  Depression screen [facility] 2/9     06/12/2023   11:29 AM 06/01/2023    1:21 PM 05/04/2023   10:16 AM 05/01/2023   11:12 AM 05/01/2023   11:08 AM 02/06/2023   10:59 AM 12/26/2022   11:01 AM  Depression screen [facility] 2/9  Decreased Interest 1 2 2   0 1 1  Down, Depressed, Hopeless 1 2 2 1  0 1 1  PHQ - 2 Score 2 4 4 1  0 2 2  Altered sleeping  2 3       Tired, decreased energy  2 2      Change in appetite  2 3      Feeling bad or failure about yourself   2 2      Trouble concentrating  2 2      Moving slowly or fidgety/restless  1 2      Suicidal thoughts  1 0      PHQ-9 Score  16 18      Difficult doing work/chores  Somewhat difficult Very difficult          Review of Systems  Musculoskeletal:        Left side pain   All other systems reviewed and are negative.     Objective:   Physical Exam  Awake, alert, sleepy- falling asleep- but not as bad today as last appointment- in power w/c- on 4L O2; and accompanied  by wife, NAD A moderate amount of ecchymoses in arms from ED visit.   COPD sounding cough      Assessment & Plan:   Pt is a 58 yr old male with hx of Multitrauma- causing L femoral neck fx, degloving of L hip to going/scrotum, bladder neck trauma- got SPC, developed compartment syndrome -s/p surgery for that; also diverting colostomy, skin grafts, and s/p trach and PEG- PEG is out. 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 S/P screw removal and on IV ABX for L hip osteomyelitis. 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  Has R severe ulnar neuropathy as well. O2 3L full time.   Here for f/u on multitrauma and chronic L hip osteomyelitis with associated chronic pain.     Not sure if he had UTI vs Colonization- because last few times; large leuks, (+) nitrites; and 21-50 WBCs and many bacteria. But If ill, only thing can do is treat.   2. Worker's comp needs to get rear  joystick for w/c.  When he gets confused and sedated, she NEEDS to be able to drive him from 1 point to another- to get up ramp of van; to get to ED, etc.    3.  Also needs to get new hospital bed, so can have rails to keep him in bed- not TRYING to get out of bed- - because he keeps sliding out between rails when asleep- this causes pain and increases his fall risk and cannot afford to have fracture.   4.  Con't MSIR and Fentanyl- picking up refills today.    5.  Suggest cranberry pills- take to acidify urine so can reduce chance of UTI's possibly.  SPC to be changed next week.    6.  F/U q month- f/u on chronic pain; work accident.   7. Worker's comp nurse isn't here- so don't need to speka with her today.     I spent a total of  30  minutes on total care today- >50% coordination of care- due to d/w pt and wife about: joystick, hospital bed; pain meds- to prevent UTI- cannot use ABX preventatively, so need something else.  Also d/w pt and wife about colonization vs UTI vs Sx' sof bronchitis?

## 2023-07-05 NOTE — Patient Instructions (Addendum)
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 S/P screw removal and on IV ABX for L hip osteomyelitis. 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  Has R severe ulnar neuropathy as well. O2 3L full time.   Here for f/u on multitrauma and chronic L hip osteomyelitis with associated chronic pain.     Not sure if he had UTI vs Colonization- because last few times; large leuks, (+) nitrites; and 21-50 WBCs and many bacteria. But If ill, only thing can do is treat.   2. Worker's comp needs to get rear joystick for w/c.  When he gets confused and sedated, she NEEDS to be able to drive him from 1 point to another- to get up ramp of van; to get to ED, etc.     3.  Also needs to get new hospital bed, so can have rails to keep him in bed- not TRYING to get out of bed- - because he keeps sliding out between rails when asleep- this causes pain and increases his fall risk and cannot afford to have fracture.   4.  Con't MSIR and Fentanyl- picking up refills today.    5.  Suggest cranberry pills- take to acidify urine so can reduce chance of UTI's possibly.    6.  F/U q month- f/u on chronic pain; work accident.

## 2023-07-24 ENCOUNTER — Ambulatory Visit: Payer: Self-pay | Admitting: Physical Medicine and Rehabilitation

## 2023-08-04 ENCOUNTER — Telehealth: Payer: Self-pay | Admitting: *Deleted

## 2023-08-04 MED ORDER — MORPHINE SULFATE 15 MG PO TABS: 15.0000 mg | ORAL_TABLET | ORAL | 0 refills | Status: DC | PRN

## 2023-08-04 MED ORDER — FENTANYL 75 MCG/HR TD PT72: 1.0000 | MEDICATED_PATCH | TRANSDERMAL | 0 refills | Status: DC

## 2023-08-04 NOTE — Telephone Encounter (Signed)
Christopher [name] called to request refills on his morphine and fentanyl patches.

## 2023-08-07 ENCOUNTER — Telehealth: Payer: Self-pay | Admitting: Family Medicine

## 2023-08-07 NOTE — Telephone Encounter (Signed)
 [facility] faxed document Home Health Certificate (Order ID [id number]), to be filled out by provider. Patient requested to send it back via Fax within 5-days. Document is located in providers tray at front office.Please advise at  [phone].

## 2023-08-08 NOTE — Telephone Encounter (Signed)
Form placed to be reviewed in PCP office

## 2023-08-08 NOTE — Telephone Encounter (Signed)
Form faxed to [phone] Form placed to be scan in patient chart

## 2023-08-14 ENCOUNTER — Other Ambulatory Visit: Payer: Self-pay | Admitting: Family Medicine

## 2023-08-23 ENCOUNTER — Ambulatory Visit: Payer: PRIVATE HEALTH INSURANCE | Admitting: Psychology

## 2023-08-29 ENCOUNTER — Ambulatory Visit: Payer: PRIVATE HEALTH INSURANCE | Admitting: Psychology

## 2023-09-01 ENCOUNTER — Telehealth: Payer: Self-pay | Admitting: Physical Medicine and Rehabilitation

## 2023-09-01 MED ORDER — MORPHINE SULFATE 15 MG PO TABS: 15.0000 mg | ORAL_TABLET | ORAL | 0 refills | Status: DC | PRN

## 2023-09-01 MED ORDER — FENTANYL 75 MCG/HR TD PT72: 1.0000 | MEDICATED_PATCH | TRANSDERMAL | 0 refills | Status: DC

## 2023-09-01 NOTE — Telephone Encounter (Signed)
 Wife left voicemail asking for refills on morphine and fentanyl patch.

## 2023-09-04 ENCOUNTER — Encounter: Payer: PRIVATE HEALTH INSURANCE | Admitting: Physical Medicine and Rehabilitation

## 2023-09-19 ENCOUNTER — Other Ambulatory Visit: Payer: Self-pay | Admitting: Physical Medicine and Rehabilitation

## 2023-09-26 ENCOUNTER — Ambulatory Visit: Payer: Self-pay | Admitting: Family

## 2023-09-26 ENCOUNTER — Encounter: Payer: Self-pay | Admitting: Family

## 2023-09-26 ENCOUNTER — Other Ambulatory Visit: Payer: Self-pay

## 2023-09-26 VITALS — BP 120/80 | HR 110 | Temp 98.2°F

## 2023-09-26 DIAGNOSIS — Z9189 Other specified personal risk factors, not elsewhere classified: Secondary | ICD-10-CM

## 2023-09-26 DIAGNOSIS — F32A Depression, unspecified: Secondary | ICD-10-CM | POA: Diagnosis not present

## 2023-09-26 DIAGNOSIS — M8668 Other chronic osteomyelitis, other site: Secondary | ICD-10-CM

## 2023-09-26 DIAGNOSIS — M86352 Chronic multifocal osteomyelitis, left femur: Secondary | ICD-10-CM

## 2023-09-26 DIAGNOSIS — F419 Anxiety disorder, unspecified: Secondary | ICD-10-CM | POA: Diagnosis not present

## 2023-09-26 MED ORDER — CIPROFLOXACIN HCL 500 MG PO TABS: 500.0000 mg | ORAL_TABLET | Freq: Two times a day (BID) | ORAL | 11 refills | Status: DC

## 2023-09-26 MED ORDER — VANCOMYCIN HCL 125 MG PO CAPS: 125.0000 mg | ORAL_CAPSULE | Freq: Two times a day (BID) | ORAL | 11 refills | Status: AC

## 2023-09-26 NOTE — Assessment & Plan Note (Addendum)
 Mr. Ratledge continues to have adequately controlled infection in the setting of chronic osteomyelitis status post hardware removal with multidrug-resistant Pseudomonas and maintained on ciprofloxacin.  No adverse side effects with medication.  Taking oral vancomycin for C. difficile prevention.  Reviewed plan of care to continue current dose of ciprofloxacin supplemented with oral vancomycin.  Recent lab work reviewed with normal kidney function, liver function, electrolytes.  Plan for follow-up in 6 months or sooner if needed.

## 2023-09-26 NOTE — Assessment & Plan Note (Signed)
 Mr. Christopher [name] feel remains at risk for C. difficile infection with chronic ciprofloxacin use.  Continue oral vancomycin.

## 2023-09-26 NOTE — Progress Notes (Signed)
 Subjective:    Patient ID: Christopher [name], male    DOB: 1965-10-10, 58 y.o.   MRN: [id number]  Chief Complaint  Patient presents with   Follow-up    HPI:  Christopher [name] is a 58 y.o. male with multiple pelvic fractures sustained from trauma complicated by hardware associated osteomyelitis status post hardware and excision of bone with cultures positive for multidrug-resistant Pseudomonas and maintained on ciprofloxacin and oral vancomycin last seen on 04/10/2023 with adequately controlled infection and good adherence and tolerance to antibiotics.  Here today with wife [name] for routine follow up.  Christopher [name] is doing well today and continues to take ciprofloxacin and oral vancomycin with no adverse side effects or problems obtaining medication from the pharmacy.  Right hip wound remains open and has varying levels of drainage that wax and wane and are without purulence or odor.  No fevers, chills, or sweats.  Anxiety has improved and is working with psychiatry.  Purchased a new mattress which has helped with comfort levels and purchased the house that he was renting.  No fevers, chills, sweats, diarrhea, or new myalgias.   Allergies  Allergen Reactions   Methadone Other (See Comments)    Hallucinations/confusion      Outpatient Medications Prior to Visit  Medication Sig Dispense Refill   albuterol (VENTOLIN HFA) 108 (90 Base) MCG/ACT inhaler INHALE 2 PUFFS BY MOUTH EVERY 6 HOURS AS NEEDED FOR WHEEZE OR SHORTNESS OF BREATH 18 each 1   BREZTRI AEROSPHERE 160-9-4.8 MCG/ACT AERO INHALE 2 PUFFS INTO THE LUNGS IN THE MORNING AND AT BEDTIME. 10.7 each 5   famotidine (PEPCID) 40 MG tablet TAKE 1 TABLET BY MOUTH TWICE A DAY (Patient taking differently: Take 40 mg by mouth at bedtime.) 180 tablet 1   fentaNYL (DURAGESIC) 75 MCG/HR Place 1 patch onto the skin every other day. ICD 10 code- G89.21- For chronic pain 15 patch 0   furosemide (LASIX) 40 MG tablet Take 1 tablet (40 mg  total) by mouth daily. 90 tablet 1   gabapentin (NEURONTIN) 300 MG capsule Take 1 capsule (300 mg total) by mouth 3 (three) times daily. 270 capsule 3   GEMTESA 75 MG TABS Take 75 mg by mouth daily.     hydrochlorothiazide (HYDRODIURIL) 12.5 MG tablet TAKE 1 TABLET BY MOUTH EVERY DAY 90 tablet 3   imipramine (TOFRANIL) 25 MG tablet TAKE 1 TABLET (25 MG TOTAL) BY MOUTH IN THE MORNING, AT NOON, AND AT BEDTIME. FOR BLADDER SPASMS 270 tablet 1   LINZESS 72 MCG capsule TAKE 2 CAPSULES (145 MCG TOTAL) BY MOUTH DAILY BEFORE BREAKFAST. (Patient taking differently: Take 72 mcg by mouth daily before breakfast.) 30 capsule 5   LORazepam (ATIVAN) 0.5 MG tablet Take 0.5 mg by mouth 3 (three) times daily.     melatonin ([employer] MELATONIN) 3 MG TABS tablet Take 1 tablet (3 mg total) by mouth at bedtime. 10 tablet 0   morphine (MSIR) 15 MG tablet Take 1 tablet (15 mg total) by mouth every 3 (three) hours as needed for severe pain (pain score 7-10). G89.21- for chornic pain 200 tablet 0   Multiple Vitamin (MULTIVITAMIN WITH MINERALS) TABS tablet Take 1 tablet by mouth daily.     naloxone (NARCAN) nasal spray 4 mg/0.1 mL To use if pt develops unconsciousness or confusion that family thinks is related to opioids. 1 each 3   OLANZapine (ZYPREXA) 5 MG tablet Take 2.5-10 mg by mouth See admin instructions. 2.5 mgh in the morning,  and midday, and 10 mg at bedtime     oxybutynin (DITROPAN) 5 MG tablet Take 5 mg by mouth See admin instructions. Take 5 mg by mouth three times a day and an additional 5 mg once daily as needed for urinary urgency     pantoprazole (PROTONIX) 40 MG tablet Take 1 tablet (40 mg total) by mouth daily. 90 tablet 2   PARoxetine (PAXIL) 40 MG tablet TAKE 1 TABLET BY MOUTH EVERYDAY AT BEDTIME 90 tablet 2   potassium chloride SA (KLOR-CON M) 20 MEQ tablet TAKE 1 TABLET BY MOUTH EVERY DAY 90 tablet 1   Probiotic Product (PROBIOTIC COLON SUPPORT) CAPS Take 1 capsule by mouth daily.     promethazine  (PHENERGAN) 12.5 MG tablet TAKE 1 TABLET (12.5 MG TOTAL) BY MOUTH EVERY 6 (SIX) HOURS AS NEEDED FOR NAUSEA, VOMITING OR REFRACTORY NAUSEA / VOMITING. 90 tablet 5   senna-docusate (SENOKOT-S) 8.6-50 MG tablet Take 2 tablets by mouth 3 (three) times daily.     Spacer/Aero-Holding Chambers (AEROCHAMBER MV) inhaler Use as instructed with MDI 1 each 0   ciprofloxacin (CIPRO) 500 MG tablet Take 1 tablet (500 mg total) by mouth 2 (two) times daily. 60 tablet 11   vancomycin (VANCOCIN) 125 MG capsule Take 1 capsule (125 mg total) by mouth in the morning and at bedtime. 60 capsule 11   No facility-administered medications prior to visit.     Past Medical History:  Diagnosis Date   Acute on chronic respiratory failure with hypoxia (HCC) 06/2018   trach removed 11-16-2018, on vent from jan until may 2020 - uses albuterol prn   Anxiety    Bacteremia due to Pseudomonas 06/2018   Chronic osteomyelitis (HCC)    Chronic pain syndrome    Clostridium difficile colitis 10/30/2019   tx with abx    Depression    DVT (deep venous thrombosis) (HCC) 2020   right brachial post PICC line   History of blood transfusion 06/2018   History of Clostridioides difficile colitis    History of kidney stones    Hypertension    norvasc d/c by pcp on 11/05/19   Multiple traumatic injuries    Penile pain 11/18/2019   Pneumonia 11/2009   2020 x 2   Walker as ambulation aid    Wheelchair bound    electric   Wound discharge    left hip wound with bloody/clear drainage change dressing q day surgilube with gauze, between legs wound using calcium algenate pad bid     Past Surgical History:  Procedure Laterality Date   APPLICATION OF A-CELL OF BACK N/A 08/06/2018   Procedure: Application Of A-Cell Of Back;  Surgeon: Thornell Flirt, DO;  [facility]: [facility];  Service: Plastics;  Laterality: N/A;   APPLICATION OF A-CELL OF EXTREMITY Left 08/06/2018   Procedure: Application Of A-Cell Of Extremity;  Surgeon: Thornell Flirt, DO;  [facility]: [facility];  Service: Plastics;  Laterality: Left;   APPLICATION OF A-CELL OF EXTREMITY Left 09/18/2019   Procedure: APPLICATION OF A-CELL OF EXTREMITY;  Surgeon: Thornell Flirt, DO;  [facility]: [facility];  Service: Plastics;  Laterality: Left;   APPLICATION OF WOUND VAC  07/12/2018   Procedure: Application Of Wound Vac to the Left Thigh and Scrotum.;  Surgeon: Laneta Pintos, MD;  Location: [facility] OR;  Service: Orthopedics;;   APPLICATION OF WOUND VAC  07/10/2018   Procedure: Application Of Wound Vac;  Surgeon: Adalberto Acton, MD;  Location: [facility] OR;  Service:  General;;   BIOPSY  11/02/2022   Procedure: BIOPSY;  Surgeon: Meridee Score Netty Starring., MD;  Location: [facility];  Service: Gastroenterology;;   COLON SURGERY  2020   colostomy   COLOSTOMY N/A 07/23/2018   Procedure: COLOSTOMY;  Surgeon: Violeta Gelinas, MD;  Location: [facility] OR;  Service: General;  Laterality: N/A;   CYSTOSCOPY W/ URETERAL STENT PLACEMENT N/A 07/15/2018   Procedure: RETROGRADE URETHROGRAM;  Surgeon: Marcine Matar, MD;  Location: [facility] OR;  Service: Urology;  Laterality: N/A;   CYSTOSCOPY WITH LITHOLAPAXY N/A 05/06/2019   Procedure: CYSTOSCOPY BASKET BLADDER STONE EXTRACTION;  Surgeon: Malen Gauze, MD;  Location: [facility];  Service: Urology;  Laterality: N/A;  30 MINS   CYSTOSTOMY N/A 05/06/2019   Procedure: REPLACEMENT OF SUPRAPUBIC CATHETER;  Surgeon: Malen Gauze, MD;  Location: [facility];  Service: Urology;  Laterality: N/A;   DEBRIDEMENT AND CLOSURE WOUND Left 03/04/2019   Procedure: Excision of hip wound with placement of Acell;  Surgeon: Peggye Form, DO;  [facility];  Service: Plastics;  Laterality: Left;   ESOPHAGOGASTRODUODENOSCOPY N/A 08/14/2018   Procedure: ESOPHAGOGASTRODUODENOSCOPY (EGD);  Surgeon: Violeta Gelinas, MD;  Location: [facility] ENDOSCOPY;  Service: General;  Laterality: N/A;  bedside   ESOPHAGOGASTRODUODENOSCOPY (EGD) WITH PROPOFOL  N/A 11/02/2022   Procedure: ESOPHAGOGASTRODUODENOSCOPY (EGD) WITH PROPOFOL;  Surgeon: Meridee Score Netty Starring., MD;  Location: [facility]COPY;  Service: Gastroenterology;  Laterality: N/A;   FACIAL RECONSTRUCTION SURGERY     X 2--once as a teenager and second time in his 55's   HARDWARE REMOVAL Left 03/04/2019   Procedure: Left Hip Hardware Removal;  Surgeon: Roby Lofts, MD;  Location: [facility];  Service: Orthopedics;  Laterality: Left;   HEMOSTASIS CLIP PLACEMENT  11/02/2022   Procedure: HEMOSTASIS CLIP PLACEMENT;  Surgeon: Lemar Lofty., MD;  Location: [facility];  Service: Gastroenterology;;   HIP PINNING,CANNULATED Left 07/12/2018   Procedure: CANNULATED HIP PINNING;  Surgeon: Roby Lofts, MD;  [facility];  Service: Orthopedics;  Laterality: Left;   HIP SURGERY     HOLMIUM LASER APPLICATION Right 07/15/2019   Procedure: HOLMIUM LASER APPLICATION;  Surgeon: Malen Gauze, MD;  Location: [facility];  Service: Urology;  Laterality: Right;   HOT HEMOSTASIS N/A 11/02/2022   Procedure: HOT HEMOSTASIS (ARGON PLASMA COAGULATION/BICAP);  Surgeon: Lemar Lofty., MD;  Location: [facility];  Service: Gastroenterology;  Laterality: N/A;   I & D EXTREMITY Left 07/25/2018   Procedure: Debridement of buttock, scrotum and left leg, placement of acell and vac;  Surgeon: Peggye Form, DO;  [facility];  Service: Plastics;  Laterality: Left;   I & D EXTREMITY N/A 08/06/2018   Procedure: Debridement of buttock, scrotum and left leg;  Surgeon: Peggye Form, DO;  [facility];  Service: Plastics;  Laterality: N/A;   I & D EXTREMITY N/A 08/13/2018   Procedure: Debridement of buttock, scrotum and left leg, placement of acell and vac;  Surgeon: Peggye Form, DO;  [facility]: [facility];  Service: Plastics;  Laterality: N/A;  90 min, please   INCISION AND DRAINAGE HIP Left 09/18/2019   Procedure: IRRIGATION AND DEBRIDEMENT HIP/ PELVIS WITH WOUND VAC  PLACEMENT;  Surgeon: Roby Lofts, MD;  [facility];  Service: Orthopedics;  Laterality: Left;   INCISION AND DRAINAGE OF WOUND N/A 07/18/2018   Procedure: Debridement of left leg, buttocks and scrotal wound with placement of acell and Flexiseal;  Surgeon: Peggye Form, DO;  [facility]: [facility];  Service: Plastics;  Laterality: N/A;   INCISION AND DRAINAGE OF WOUND Left 08/29/2018   Procedure: Debridement of buttock, scrotum and left leg, placement of acell and vac;  Surgeon: Peggye Form, DO;  [facility]: [facility];  Service: Plastics;  Laterality: Left;  75 min, please   INCISION AND DRAINAGE OF WOUND Bilateral 10/23/2018   Procedure: DEBRIDEMENT OF BUTTOCK,SCROTUM, AND LEG WOUNDS WITH PLACEMENT OF ACELL- BILATERAL 90 MIN;  Surgeon: Peggye Form, DO;  Location: [facility];  Service: Plastics;  Laterality: Bilateral;   IR ANGIOGRAM PELVIS SELECTIVE OR SUPRASELECTIVE  07/10/2018   IR ANGIOGRAM PELVIS SELECTIVE OR SUPRASELECTIVE  07/10/2018   IR ANGIOGRAM SELECTIVE EACH ADDITIONAL VESSEL  07/10/2018   IR EMBO ART  VEN HEMORR LYMPH EXTRAV  INC GUIDE ROADMAPPING  07/10/2018   IR GASTROSTOMY TUBE MOD SED  01/21/2021   IR NEPHROSTOMY PLACEMENT LEFT  04/05/2019   IR NEPHROSTOMY PLACEMENT RIGHT  05/31/2019   IR US GUIDE BX ASP/DRAIN  07/10/2018   IR US GUIDE VASC ACCESS RIGHT  07/10/2018   IR VENO/EXT/UNI LEFT  07/10/2018   IRRIGATION AND DEBRIDEMENT OF WOUND WITH SPLIT THICKNESS SKIN GRAFT Left 09/19/2018   Procedure: Debridement of gluteal wound with placement of acell to left leg.;  Surgeon: Peggye Form, DO;  [facility];  Service: Plastics;  Laterality: Left;  2.5 hours, please   LAPAROTOMY N/A 07/12/2018   Procedure: EXPLORATORY LAPAROTOMY;  Surgeon: Violeta Gelinas, MD;  Location: [facility] OR;  Service: General;  Laterality: N/A;   LAPAROTOMY N/A 07/15/2018   Procedure: WOUND EXPLORATION; CLOSURE OF ABDOMEN;  Surgeon: Violeta Gelinas, MD;  Location: [facility] OR;  Service: General;  Laterality:  N/A;   LAPAROTOMY  07/10/2018   Procedure: Exploratory Laparotomy;  Surgeon: Berna Bue, MD;  Location: [facility] OR;  Service: General;;   MASS EXCISION Left 09/18/2019   Procedure: EXCISION UPPER LEFT INNER THIGH WOUND;  Surgeon: Peggye Form, DO;  [facility];  Service: Plastics;  Laterality: Left;   NEPHROLITHOTOMY Right 07/15/2019   Procedure: NEPHROLITHOTOMY PERCUTANEOUS;  Surgeon: Malen Gauze, MD;  Location: [facility];  Service: Urology;  Laterality: Right;  90 MINS   PEG PLACEMENT N/A 08/14/2018   Procedure: PERCUTANEOUS ENDOSCOPIC GASTROSTOMY (PEG) PLACEMENT;  Surgeon: Violeta Gelinas, MD;  Location: [facility] ENDOSCOPY;  Service: General;  Laterality: N/A;   PERCUTANEOUS TRACHEOSTOMY N/A 08/02/2018   Procedure: PERCUTANEOUS TRACHEOSTOMY;  Surgeon: Violeta Gelinas, MD;  Location: [facility] OR;  Service: General;  Laterality: N/A;   RADIOLOGY WITH ANESTHESIA N/A 07/10/2018   Procedure: IR WITH ANESTHESIA;  Surgeon: Simonne Come, MD;  [facility] OR;  Service: Radiology;  Laterality: N/A;   RADIOLOGY WITH ANESTHESIA Right 07/10/2018   Procedure: Ir With Anesthesia;  Surgeon: Simonne Come, MD;  Location: [facility] OR;  Service: Radiology;  Laterality: Right;   SAVORY DILATION N/A 11/02/2022   Procedure: SAVORY DILATION;  Surgeon: Meridee Score Netty Starring., MD;  Location: [facility];  Service: Gastroenterology;  Laterality: N/A;   SCROTAL EXPLORATION N/A 07/15/2018   Procedure: SCROTUM DEBRIDEMENT;  Surgeon: Marcine Matar, MD;  Location: [facility] OR;  Service: Urology;  Laterality: N/A;   SHOULDER SURGERY     SKIN SPLIT GRAFT Right 09/19/2018   Procedure: Skin Graft Split Thickness;  Surgeon: Peggye Form, DO;  Location[facility] OR;  Service: Plastics;  Laterality: Right;   SKIN SPLIT GRAFT N/A 10/03/2018   Procedure: Split thickness skin graft to gluteal area with acell placement;  Surgeon:  Dillingham, Alena Bills, DO;  Location: [facility] OR;  Service: Plastics;  Laterality: N/A;  3 hours,  please   VACUUM ASSISTED CLOSURE CHANGE N/A 07/12/2018   Procedure: ABDOMINAL VACUUM ASSISTED CLOSURE CHANGE and abdominal washout;  Surgeon: Violeta Gelinas, MD;  Location: [facility] OR;  Service: General;  Laterality: N/A;   WOUND DEBRIDEMENT Left 07/23/2018   Procedure: DEBRIDEMENT LEFT BUTTOCK  WOUND;  Surgeon: Violeta Gelinas, MD;  Location: [facility] OR;  Service: General;  Laterality: Left;   WOUND EXPLORATION Left 07/10/2018   Procedure: WOUND EXPLORATION LEFT GROIN;  Surgeon: Berna Bue, MD;  Location: [facility] OR;  Service: General;  Laterality: Left;       Review of Systems  Constitutional:  Negative for chills, diaphoresis, fatigue and fever.  Respiratory:  Negative for cough, chest tightness, shortness of breath and wheezing.   Cardiovascular:  Negative for chest pain.  Gastrointestinal:  Negative for abdominal pain, diarrhea, nausea and vomiting.      Objective:    BP 120/80   Pulse (!) 110   Temp 98.2 F (36.8 C) (Temporal)   SpO2 93% Comment: 3L O2 Nursing note and vital signs reviewed.  Physical Exam Constitutional:      General: He is not in acute distress.    Appearance: He is well-developed.     Comments: Seated in the wheelchair; pleasant  Cardiovascular:     Rate and Rhythm: Normal rate and regular rhythm.     Heart sounds: Normal heart sounds.  Pulmonary:     Effort: Pulmonary effort is normal.     Breath sounds: Normal breath sounds.  Skin:    General: Skin is warm and dry.  Neurological:     Mental Status: He is alert and oriented to person, place, and time.  Psychiatric:        Mood and Affect: Mood normal.         06/12/2023   11:29 AM 06/01/2023    1:21 PM 05/04/2023   10:16 AM 05/01/2023   11:12 AM 05/01/2023   11:08 AM  Depression screen PHQ 2/9  Decreased Interest 1 2 2   0  Down, Depressed, Hopeless 1 2 2 1  0  PHQ - 2 Score 2 4 4 1  0  Altered sleeping  2 3    Tired, decreased energy  2 2    Change in appetite  2 3    Feeling bad or failure  about yourself   2 2    Trouble concentrating  2 2    Moving slowly or fidgety/restless  1 2    Suicidal thoughts  1 0    PHQ-9 Score  16 18    Difficult doing work/chores  Somewhat difficult Very difficult         Assessment & Plan:    Patient Active Problem List   Diagnosis Date Noted   Dyslipidemia 05/05/2023   Metabolic syndrome 05/04/2023   At risk for Clostridium difficile infection 04/10/2023   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   At risk for complication of anesthesia 10/29/2022   Therapeutic drug monitoring 10/27/2022   Bladder spasms 10/03/2022   Leg edema 09/30/2022   Bilateral inguinal hernia without obstruction or gangrene 07/23/2022   Encounter for care related to feeding tube 07/23/2022   Irritant contact dermatitis associated with fecal stoma 07/23/2022   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   Memory deficits 01/12/2022   Hypnagogic jerks 09/22/2021   Neuromuscular weakness (HCC) 06/24/2021   Depression, major, single episode, moderate (HCC) 06/24/2021  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   Chronic osteomyelitis of hip (HCC) 11/22/2020   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/22/2020   Dysphagia 07/02/2020   Therapeutic opioid induced constipation 04/11/2020   Status post peripherally inserted central catheter (PICC) central line placement 03/17/2020   Skin-picking disorder 03/12/2020   Recurrent pain of right knee 11/08/2019   Essential hypertension 11/05/2019   Myofascial pain dysfunction syndrome 08/05/2019   Complicated urinary tract infection 05/30/2019   Wound infection, posttraumatic 02/28/2019   Hydronephrosis concurrent with and due to calculi of kidney and ureter 02/28/2019   GERD (gastroesophageal reflux disease) 02/28/2019   Suprapubic catheter (HCC) 02/28/2019   Anxiety 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 01/29/2019   Chronic pain due to trauma 01/29/2019   Neuropathic pain 01/29/2019   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    Insomnia 01/14/2019   Current use of proton pump inhibitor 01/14/2019   Wound of buttock 12/28/2018   Anxiety and depression 12/17/2018   Debility 11/23/2018   Chronic pain syndrome    Multiple traumatic injuries    Pressure injury of skin 08/13/2018   Urethral injury 07/13/2018     Problem List Items Addressed This Visit       Musculoskeletal and Integument   Chronic osteomyelitis of hip [facility])   Christopher [name] continues to have adequately controlled infection in the setting of chronic osteomyelitis status post hardware removal with multidrug-resistant Pseudomonas and maintained on ciprofloxacin.  No adverse side effects with medication.  Taking oral vancomycin for C. difficile prevention.  Reviewed plan of care to continue current dose of ciprofloxacin supplemented with oral vancomycin.  Recent lab work reviewed with normal kidney function, liver function, electrolytes.  Plan for follow-up in 6 months or sooner if needed.      Relevant Medications   vancomycin (VANCOCIN) 125 MG capsule   ciprofloxacin (CIPRO) 500 MG tablet     Other   Anxiety and depression - Primary   Anxiety and depression improved and continues to work with counseling and psychiatry.  Well-maintained with current medication regimen.  No suicidal ideations or signs of psychosis.  Continue management per psychiatry and counseling.      At risk for Clostridium difficile infection   Christopher [name] feel remains at risk for C. difficile infection with chronic ciprofloxacin use.  Continue oral vancomycin.        I am having Christopher [name] maintain his multivitamin with minerals, melatonin, oxybutynin, Probiotic Colon Support, senna-docusate, famotidine, LORazepam, OLANZapine, Linzess, Gemtesa, PARoxetine, naloxone, furosemide, AeroChamber MV, pantoprazole, imipramine, gabapentin, Breztri Aerosphere, potassium chloride SA, hydrochlorothiazide, albuterol, fentaNYL, morphine, promethazine, vancomycin, and  ciprofloxacin.   Meds ordered this encounter  Medications   vancomycin (VANCOCIN) 125 MG capsule    Sig: Take 1 capsule (125 mg total) by mouth in the morning and at bedtime.    Dispense:  60 capsule    Refill:  11    Supervising Provider:   SNIDER, CYNTHIA [[id number]]   ciprofloxacin (CIPRO) 500 MG tablet    Sig: Take 1 tablet (500 mg total) by mouth 2 (two) times daily.    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 Spoke with pharmacist at this store regarding this.    Supervising Provider:   Liane Redman [phone]     Follow-up: Return in about 6 months (around 03/27/2024). or sooner if needed.   Christopher Weslie Pretlow, MSN, FNP-C Nurse  Practitioner [facility] Main number: [phone]

## 2023-09-26 NOTE — Patient Instructions (Addendum)
Nice to see you. ° °Continue to take your medication daily as prescribed. ° °Refills have been sent to the pharmacy. ° °Plan for follow up in 6 months or sooner if needed with lab work on the same day. ° °Have a great day and stay safe! ° °

## 2023-09-26 NOTE — Assessment & Plan Note (Signed)
 Anxiety and depression improved and continues to work with counseling and psychiatry.  Well-maintained with current medication regimen.  No suicidal ideations or signs of psychosis.  Continue management per psychiatry and counseling.

## 2023-10-02 ENCOUNTER — Telehealth: Payer: Self-pay | Admitting: Family Medicine

## 2023-10-02 ENCOUNTER — Telehealth: Payer: Self-pay | Admitting: Physical Medicine and Rehabilitation

## 2023-10-02 DIAGNOSIS — E43 Unspecified severe protein-calorie malnutrition: Secondary | ICD-10-CM | POA: Diagnosis not present

## 2023-10-02 DIAGNOSIS — G894 Chronic pain syndrome: Secondary | ICD-10-CM

## 2023-10-02 DIAGNOSIS — G8220 Paraplegia, unspecified: Secondary | ICD-10-CM

## 2023-10-02 DIAGNOSIS — Z466 Encounter for fitting and adjustment of urinary device: Secondary | ICD-10-CM

## 2023-10-02 DIAGNOSIS — Z435 Encounter for attention to cystostomy: Secondary | ICD-10-CM

## 2023-10-02 DIAGNOSIS — F1120 Opioid dependence, uncomplicated: Secondary | ICD-10-CM

## 2023-10-02 DIAGNOSIS — I10 Essential (primary) hypertension: Secondary | ICD-10-CM

## 2023-10-02 DIAGNOSIS — I700 Atherosclerosis of aorta: Secondary | ICD-10-CM

## 2023-10-02 DIAGNOSIS — R1310 Dysphagia, unspecified: Secondary | ICD-10-CM

## 2023-10-02 DIAGNOSIS — Z933 Colostomy status: Secondary | ICD-10-CM

## 2023-10-02 DIAGNOSIS — M86652 Other chronic osteomyelitis, left thigh: Secondary | ICD-10-CM

## 2023-10-02 DIAGNOSIS — G4700 Insomnia, unspecified: Secondary | ICD-10-CM

## 2023-10-02 NOTE — Telephone Encounter (Signed)
 Document Home Health Certificate (Order ID [id number]), to be filled out by provider. Patient requested to send it back via Fax within 5-days. Document is located in providers tray at front office.Please advise

## 2023-10-02 NOTE — Telephone Encounter (Signed)
 Placed in PCP office for reviewed

## 2023-10-02 NOTE — Telephone Encounter (Signed)
 Patients wife called in requesting medication refill on morphine  (MSIR) 15 MG tablet  and fentaNYL  (DURAGESIC ) 75 MCG/HR

## 2023-10-02 NOTE — Telephone Encounter (Signed)
Form faxed to [phone] Form placed to be scan in patient chart

## 2023-10-03 MED ORDER — FENTANYL 75 MCG/HR TD PT72: 1.0000 | MEDICATED_PATCH | TRANSDERMAL | 0 refills | Status: DC

## 2023-10-03 MED ORDER — MORPHINE SULFATE 15 MG PO TABS: 15.0000 mg | ORAL_TABLET | ORAL | 0 refills | Status: DC | PRN

## 2023-10-03 NOTE — Telephone Encounter (Signed)
 Rx sent to the pharmacy. Christopher [name] has been informed.

## 2023-10-05 ENCOUNTER — Encounter: Payer: Self-pay | Attending: Physical Medicine and Rehabilitation | Admitting: Psychology

## 2023-10-05 DIAGNOSIS — Z9359 Other cystostomy status: Secondary | ICD-10-CM | POA: Insufficient documentation

## 2023-10-05 DIAGNOSIS — R413 Other amnesia: Secondary | ICD-10-CM

## 2023-10-05 DIAGNOSIS — F321 Major depressive disorder, single episode, moderate: Secondary | ICD-10-CM

## 2023-10-05 DIAGNOSIS — K9400 Colostomy complication, unspecified: Secondary | ICD-10-CM | POA: Insufficient documentation

## 2023-10-05 DIAGNOSIS — M792 Neuralgia and neuritis, unspecified: Secondary | ICD-10-CM | POA: Diagnosis present

## 2023-10-05 DIAGNOSIS — M8668 Other chronic osteomyelitis, other site: Secondary | ICD-10-CM

## 2023-10-05 DIAGNOSIS — Z993 Dependence on wheelchair: Secondary | ICD-10-CM

## 2023-10-05 DIAGNOSIS — G894 Chronic pain syndrome: Secondary | ICD-10-CM

## 2023-10-11 ENCOUNTER — Other Ambulatory Visit: Payer: Self-pay | Admitting: Physical Medicine and Rehabilitation

## 2023-10-12 ENCOUNTER — Other Ambulatory Visit: Payer: Self-pay

## 2023-10-12 ENCOUNTER — Other Ambulatory Visit (HOSPITAL_COMMUNITY): Payer: Self-pay

## 2023-10-12 DIAGNOSIS — M8668 Other chronic osteomyelitis, other site: Secondary | ICD-10-CM

## 2023-10-12 MED ORDER — CIPROFLOXACIN HCL 500 MG PO TABS: 500.0000 mg | ORAL_TABLET | Freq: Two times a day (BID) | ORAL | 11 refills | Status: DC

## 2023-10-13 ENCOUNTER — Other Ambulatory Visit: Payer: Self-pay | Admitting: *Deleted

## 2023-10-13 MED ORDER — ALBUTEROL SULFATE HFA 108 (90 BASE) MCG/ACT IN AERS: INHALATION_SPRAY | RESPIRATORY_TRACT | 1 refills | Status: DC

## 2023-10-14 ENCOUNTER — Observation Stay (HOSPITAL_COMMUNITY): Payer: PRIVATE HEALTH INSURANCE

## 2023-10-14 ENCOUNTER — Other Ambulatory Visit: Payer: Self-pay

## 2023-10-14 ENCOUNTER — Encounter (HOSPITAL_COMMUNITY): Payer: Self-pay | Admitting: *Deleted

## 2023-10-14 ENCOUNTER — Emergency Department (HOSPITAL_COMMUNITY): Payer: PRIVATE HEALTH INSURANCE

## 2023-10-14 ENCOUNTER — Inpatient Hospital Stay (HOSPITAL_COMMUNITY)
Admission: EM | Admit: 2023-10-14 | Discharge: 2023-10-17 | DRG: 698 | Disposition: A | Discharge status: Home Health | Payer: PRIVATE HEALTH INSURANCE | Attending: Internal Medicine | Admitting: Internal Medicine

## 2023-10-14 DIAGNOSIS — Z86718 Personal history of other venous thrombosis and embolism: Secondary | ICD-10-CM

## 2023-10-14 DIAGNOSIS — F32A Depression, unspecified: Secondary | ICD-10-CM | POA: Diagnosis present

## 2023-10-14 DIAGNOSIS — L89153 Pressure ulcer of sacral region, stage 3: Secondary | ICD-10-CM | POA: Diagnosis present

## 2023-10-14 DIAGNOSIS — G9340 Encephalopathy, unspecified: Secondary | ICD-10-CM | POA: Diagnosis not present

## 2023-10-14 DIAGNOSIS — N390 Urinary tract infection, site not specified: Secondary | ICD-10-CM | POA: Insufficient documentation

## 2023-10-14 DIAGNOSIS — E785 Hyperlipidemia, unspecified: Secondary | ICD-10-CM | POA: Diagnosis present

## 2023-10-14 DIAGNOSIS — E876 Hypokalemia: Secondary | ICD-10-CM | POA: Diagnosis present

## 2023-10-14 DIAGNOSIS — N3000 Acute cystitis without hematuria: Secondary | ICD-10-CM | POA: Diagnosis not present

## 2023-10-14 DIAGNOSIS — Z933 Colostomy status: Secondary | ICD-10-CM

## 2023-10-14 DIAGNOSIS — Z87442 Personal history of urinary calculi: Secondary | ICD-10-CM

## 2023-10-14 DIAGNOSIS — T83518A Infection and inflammatory reaction due to other urinary catheter, initial encounter: Principal | ICD-10-CM | POA: Diagnosis present

## 2023-10-14 DIAGNOSIS — I10 Essential (primary) hypertension: Secondary | ICD-10-CM | POA: Diagnosis present

## 2023-10-14 DIAGNOSIS — Y828 Other medical devices associated with adverse incidents: Secondary | ICD-10-CM | POA: Diagnosis present

## 2023-10-14 DIAGNOSIS — Z803 Family history of malignant neoplasm of breast: Secondary | ICD-10-CM

## 2023-10-14 DIAGNOSIS — A419 Sepsis, unspecified organism: Secondary | ICD-10-CM | POA: Diagnosis present

## 2023-10-14 DIAGNOSIS — Z888 Allergy status to other drugs, medicaments and biological substances status: Secondary | ICD-10-CM

## 2023-10-14 DIAGNOSIS — Z79899 Other long term (current) drug therapy: Secondary | ICD-10-CM

## 2023-10-14 DIAGNOSIS — N3001 Acute cystitis with hematuria: Secondary | ICD-10-CM | POA: Diagnosis present

## 2023-10-14 DIAGNOSIS — Z87891 Personal history of nicotine dependence: Secondary | ICD-10-CM

## 2023-10-14 DIAGNOSIS — B9620 Unspecified Escherichia coli [E. coli] as the cause of diseases classified elsewhere: Secondary | ICD-10-CM | POA: Diagnosis present

## 2023-10-14 DIAGNOSIS — G894 Chronic pain syndrome: Secondary | ICD-10-CM | POA: Diagnosis present

## 2023-10-14 DIAGNOSIS — G709 Myoneural disorder, unspecified: Secondary | ICD-10-CM | POA: Diagnosis present

## 2023-10-14 DIAGNOSIS — K219 Gastro-esophageal reflux disease without esophagitis: Secondary | ICD-10-CM | POA: Diagnosis present

## 2023-10-14 DIAGNOSIS — J9611 Chronic respiratory failure with hypoxia: Secondary | ICD-10-CM | POA: Diagnosis present

## 2023-10-14 DIAGNOSIS — R4182 Altered mental status, unspecified: Principal | ICD-10-CM

## 2023-10-14 DIAGNOSIS — M8668 Other chronic osteomyelitis, other site: Secondary | ICD-10-CM | POA: Diagnosis not present

## 2023-10-14 DIAGNOSIS — N3289 Other specified disorders of bladder: Secondary | ICD-10-CM | POA: Diagnosis present

## 2023-10-14 DIAGNOSIS — G9341 Metabolic encephalopathy: Secondary | ICD-10-CM | POA: Diagnosis present

## 2023-10-14 DIAGNOSIS — Z9359 Other cystostomy status: Secondary | ICD-10-CM

## 2023-10-14 DIAGNOSIS — Z1623 Resistance to quinolones and fluoroquinolones: Secondary | ICD-10-CM | POA: Diagnosis present

## 2023-10-14 DIAGNOSIS — F419 Anxiety disorder, unspecified: Secondary | ICD-10-CM | POA: Diagnosis present

## 2023-10-14 DIAGNOSIS — F339 Major depressive disorder, recurrent, unspecified: Secondary | ICD-10-CM | POA: Diagnosis present

## 2023-10-14 DIAGNOSIS — Z993 Dependence on wheelchair: Secondary | ICD-10-CM

## 2023-10-14 DIAGNOSIS — Z9981 Dependence on supplemental oxygen: Secondary | ICD-10-CM

## 2023-10-14 DIAGNOSIS — Z79891 Long term (current) use of opiate analgesic: Secondary | ICD-10-CM

## 2023-10-14 LAB — COMPREHENSIVE METABOLIC PANEL WITH GFR
ALT: 40 U/L (ref 0–44)
AST: 49 U/L — ABNORMAL HIGH (ref 15–41)
Albumin: 4 g/dL (ref 3.5–5.0)
Alkaline Phosphatase: 118 U/L (ref 38–126)
Anion gap: 11 (ref 5–15)
BUN: 12 mg/dL (ref 6–20)
CO2: 28 mmol/L (ref 22–32)
Calcium: 10.3 mg/dL (ref 8.9–10.3)
Chloride: 99 mmol/L (ref 98–111)
Creatinine, Ser: 0.82 mg/dL (ref 0.61–1.24)
GFR, Estimated: >60 mL/min (ref >60)
Glucose, Bld: 118 mg/dL — ABNORMAL HIGH (ref 70–99)
Potassium: 3.4 mmol/L — ABNORMAL LOW (ref 3.5–5.1)
Sodium: 138 mmol/L (ref 135–145)
Total Bilirubin: 0.8 mg/dL (ref 0.0–1.2)
Total Protein: 7.7 g/dL (ref 6.5–8.1)

## 2023-10-14 LAB — URINALYSIS, ROUTINE W REFLEX MICROSCOPIC
Bilirubin Urine: NEGATIVE
Glucose, UA: NEGATIVE mg/dL
Ketones, ur: NEGATIVE mg/dL
Nitrite: NEGATIVE
Protein, ur: 100 mg/dL — AB
RBC / HPF: >50 RBC/hpf (ref 0–5)
Specific Gravity, Urine: 1.01 (ref 1.005–1.030)
WBC, UA: >50 WBC/hpf (ref 0–5)
pH: 5 (ref 5.0–8.0)

## 2023-10-14 LAB — CBC WITH DIFFERENTIAL/PLATELET
Abs Immature Granulocytes: 0.03 10*3/uL (ref 0.00–0.07)
Basophils Absolute: 0 10*3/uL (ref 0.0–0.1)
Basophils Relative: 0 %
Eosinophils Absolute: 0.5 10*3/uL (ref 0.0–0.5)
Eosinophils Relative: 5 %
HCT: 40.9 % (ref 39.0–52.0)
Hemoglobin: 13.6 g/dL (ref 13.0–17.0)
Immature Granulocytes: 0 %
Lymphocytes Relative: 14 %
Lymphs Abs: 1.3 10*3/uL (ref 0.7–4.0)
MCH: 31.1 pg (ref 26.0–34.0)
MCHC: 33.3 g/dL (ref 30.0–36.0)
MCV: 93.4 fL (ref 80.0–100.0)
Monocytes Absolute: 0.7 10*3/uL (ref 0.1–1.0)
Monocytes Relative: 8 %
Neutro Abs: 6.7 10*3/uL (ref 1.7–7.7)
Neutrophils Relative %: 73 %
Platelets: 242 10*3/uL (ref 150–400)
RBC: 4.38 MIL/uL (ref 4.22–5.81)
RDW: 13.2 % (ref 11.5–15.5)
WBC: 9.2 10*3/uL (ref 4.0–10.5)
nRBC: 0 % (ref 0.0–0.2)

## 2023-10-14 LAB — CBG MONITORING, ED: Glucose-Capillary: 119 mg/dL — ABNORMAL HIGH (ref 70–99)

## 2023-10-14 MED ORDER — SODIUM CHLORIDE 0.9 % IV SOLN
2.0000 g | Freq: Once | INTRAVENOUS | Status: AC
  Administered 2023-10-14: 2 g via INTRAVENOUS
  Filled 2023-10-14: qty 20

## 2023-10-14 MED ORDER — POTASSIUM CHLORIDE CRYS ER 20 MEQ PO TBCR
40.0000 meq | EXTENDED_RELEASE_TABLET | Freq: Once | ORAL | Status: AC
Start: 2023-10-14 — End: 2023-10-14
  Administered 2023-10-14: 40 meq via ORAL
  Filled 2023-10-14: qty 2

## 2023-10-14 MED ORDER — MELATONIN 3 MG PO TABS
3.0000 mg | ORAL_TABLET | Freq: Every day | ORAL | Status: DC
  Administered 2023-10-15 – 2023-10-16 (×3): 3 mg via ORAL
  Filled 2023-10-14 (×3): qty 1

## 2023-10-14 MED ORDER — FENTANYL 75 MCG/HR TD PT72
1.0000 | MEDICATED_PATCH | TRANSDERMAL | Status: DC
  Administered 2023-10-15: 1 via TRANSDERMAL
  Filled 2023-10-14 (×2): qty 1

## 2023-10-14 MED ORDER — ENOXAPARIN SODIUM 40 MG/0.4ML IJ SOSY
40.0000 mg | PREFILLED_SYRINGE | Freq: Every day | INTRAMUSCULAR | Status: DC
  Administered 2023-10-15 – 2023-10-17 (×3): 40 mg via SUBCUTANEOUS
  Filled 2023-10-14 (×3): qty 0.4

## 2023-10-14 MED ORDER — PANTOPRAZOLE SODIUM 40 MG PO TBEC
40.0000 mg | DELAYED_RELEASE_TABLET | Freq: Every day | ORAL | Status: DC
  Administered 2023-10-16 – 2023-10-17 (×2): 40 mg via ORAL
  Filled 2023-10-14 (×2): qty 1

## 2023-10-14 MED ORDER — PAROXETINE HCL 20 MG PO TABS
40.0000 mg | ORAL_TABLET | Freq: Every day | ORAL | Status: DC
  Administered 2023-10-15 – 2023-10-16 (×3): 40 mg via ORAL
  Filled 2023-10-14 (×4): qty 2

## 2023-10-14 MED ORDER — PROMETHAZINE HCL 25 MG PO TABS: 12.5000 mg | ORAL_TABLET | Freq: Four times a day (QID) | ORAL | Status: DC | PRN

## 2023-10-14 MED ORDER — MORPHINE SULFATE 15 MG PO TABS
15.0000 mg | ORAL_TABLET | ORAL | Status: DC | PRN
  Administered 2023-10-15 – 2023-10-17 (×3): 15 mg via ORAL
  Filled 2023-10-14 (×3): qty 1

## 2023-10-14 MED ORDER — OLANZAPINE 5 MG PO TABS
10.0000 mg | ORAL_TABLET | Freq: Every day | ORAL | Status: DC
  Administered 2023-10-15 – 2023-10-16 (×3): 10 mg via ORAL
  Filled 2023-10-14: qty 2
  Filled 2023-10-14: qty 1
  Filled 2023-10-14: qty 2

## 2023-10-14 MED ORDER — ALBUTEROL SULFATE (2.5 MG/3ML) 0.083% IN NEBU: 2.5000 mg | INHALATION_SOLUTION | RESPIRATORY_TRACT | Status: DC | PRN

## 2023-10-14 MED ORDER — FAMOTIDINE 20 MG PO TABS
40.0000 mg | ORAL_TABLET | Freq: Every day | ORAL | Status: DC
  Administered 2023-10-16 – 2023-10-17 (×2): 40 mg via ORAL
  Filled 2023-10-14 (×2): qty 2

## 2023-10-14 MED ORDER — OLANZAPINE 5 MG PO TABS
2.5000 mg | ORAL_TABLET | Freq: Two times a day (BID) | ORAL | Status: DC
  Administered 2023-10-16 – 2023-10-17 (×4): 2.5 mg via ORAL
  Filled 2023-10-14 (×5): qty 1

## 2023-10-14 MED ORDER — HYDROCHLOROTHIAZIDE 25 MG PO TABS
12.5000 mg | ORAL_TABLET | Freq: Every day | ORAL | Status: DC
  Administered 2023-10-16 – 2023-10-17 (×2): 12.5 mg via ORAL
  Filled 2023-10-14 (×2): qty 1

## 2023-10-14 MED ORDER — GABAPENTIN 300 MG PO CAPS
300.0000 mg | ORAL_CAPSULE | Freq: Three times a day (TID) | ORAL | Status: DC
  Administered 2023-10-15 – 2023-10-17 (×7): 300 mg via ORAL
  Filled 2023-10-14 (×7): qty 1

## 2023-10-14 MED ORDER — LORAZEPAM 0.5 MG PO TABS
0.5000 mg | ORAL_TABLET | Freq: Three times a day (TID) | ORAL | Status: DC
  Administered 2023-10-15 – 2023-10-17 (×7): 0.5 mg via ORAL
  Filled 2023-10-14 (×8): qty 1

## 2023-10-14 MED ORDER — MIRABEGRON ER 25 MG PO TB24
25.0000 mg | ORAL_TABLET | Freq: Every day | ORAL | Status: DC
  Administered 2023-10-16 – 2023-10-17 (×2): 25 mg via ORAL
  Filled 2023-10-14 (×3): qty 1

## 2023-10-14 MED ORDER — BUDESON-GLYCOPYRROL-FORMOTEROL 160-9-4.8 MCG/ACT IN AERO
2.0000 | INHALATION_SPRAY | Freq: Two times a day (BID) | RESPIRATORY_TRACT | Status: DC
  Filled 2023-10-14 (×2): qty 5.9

## 2023-10-14 MED ORDER — OLANZAPINE 2.5 MG PO TABS
2.5000 mg | ORAL_TABLET | ORAL | Status: DC
Start: 2023-10-14 — End: 2023-10-14

## 2023-10-14 MED ORDER — LINACLOTIDE 72 MCG PO CAPS
72.0000 ug | ORAL_CAPSULE | ORAL | Status: DC
  Administered 2023-10-16: 72 ug via ORAL
  Filled 2023-10-14: qty 1

## 2023-10-14 MED ORDER — FUROSEMIDE 40 MG PO TABS
40.0000 mg | ORAL_TABLET | Freq: Every day | ORAL | Status: DC
  Administered 2023-10-16 – 2023-10-17 (×2): 40 mg via ORAL
  Filled 2023-10-14 (×2): qty 1

## 2023-10-14 MED ORDER — VANCOMYCIN HCL 125 MG PO CAPS
125.0000 mg | ORAL_CAPSULE | Freq: Two times a day (BID) | ORAL | Status: DC
  Administered 2023-10-15 – 2023-10-17 (×5): 125 mg via ORAL
  Filled 2023-10-14 (×6): qty 1

## 2023-10-14 MED ORDER — IMIPRAMINE HCL 25 MG PO TABS
25.0000 mg | ORAL_TABLET | Freq: Three times a day (TID) | ORAL | Status: DC
  Administered 2023-10-15 – 2023-10-17 (×6): 25 mg via ORAL
  Filled 2023-10-14 (×10): qty 1

## 2023-10-14 MED ORDER — OXYBUTYNIN CHLORIDE 5 MG PO TABS: 5.0000 mg | ORAL_TABLET | ORAL | Status: DC

## 2023-10-14 MED ORDER — ALBUTEROL SULFATE (2.5 MG/3ML) 0.083% IN NEBU
2.5000 mg | INHALATION_SOLUTION | Freq: Once | RESPIRATORY_TRACT | Status: AC
  Administered 2023-10-14: 2.5 mg via RESPIRATORY_TRACT
  Filled 2023-10-14: qty 3

## 2023-10-14 MED ORDER — CIPROFLOXACIN HCL 500 MG PO TABS
500.0000 mg | ORAL_TABLET | Freq: Two times a day (BID) | ORAL | Status: DC
  Administered 2023-10-15 – 2023-10-17 (×5): 500 mg via ORAL
  Filled 2023-10-14 (×5): qty 1

## 2023-10-14 MED ORDER — SENNOSIDES-DOCUSATE SODIUM 8.6-50 MG PO TABS
2.0000 | ORAL_TABLET | Freq: Three times a day (TID) | ORAL | Status: DC
  Administered 2023-10-15 – 2023-10-16 (×4): 2 via ORAL
  Filled 2023-10-14 (×6): qty 2

## 2023-10-14 MED ORDER — LACTATED RINGERS IV BOLUS
1000.0000 mL | Freq: Once | INTRAVENOUS | Status: AC
  Administered 2023-10-14: 1000 mL via INTRAVENOUS

## 2023-10-14 MED ORDER — OXYBUTYNIN CHLORIDE 5 MG PO TABS
5.0000 mg | ORAL_TABLET | Freq: Every day | ORAL | Status: DC | PRN
  Filled 2023-10-14: qty 1

## 2023-10-14 MED ORDER — OXYBUTYNIN CHLORIDE 5 MG PO TABS
5.0000 mg | ORAL_TABLET | Freq: Three times a day (TID) | ORAL | Status: DC
  Administered 2023-10-15 – 2023-10-17 (×7): 5 mg via ORAL
  Filled 2023-10-14 (×10): qty 1

## 2023-10-14 MED ORDER — RISAQUAD PO CAPS
1.0000 | ORAL_CAPSULE | Freq: Every day | ORAL | Status: DC
Start: 2023-10-15 — End: 2023-10-17
  Administered 2023-10-16 – 2023-10-17 (×2): 1 via ORAL
  Filled 2023-10-14 (×2): qty 1

## 2023-10-14 MED ORDER — SODIUM CHLORIDE 0.9 % IV SOLN
1.0000 g | INTRAVENOUS | Status: DC
  Administered 2023-10-15 – 2023-10-16 (×2): 1 g via INTRAVENOUS
  Filled 2023-10-14 (×2): qty 10

## 2023-10-14 MED ORDER — POTASSIUM CHLORIDE CRYS ER 20 MEQ PO TBCR
20.0000 meq | EXTENDED_RELEASE_TABLET | Freq: Every day | ORAL | Status: DC
  Administered 2023-10-16: 20 meq via ORAL
  Filled 2023-10-14: qty 1

## 2023-10-14 MED ORDER — MAGNESIUM OXIDE -MG SUPPLEMENT 400 (240 MG) MG PO TABS
800.0000 mg | ORAL_TABLET | Freq: Once | ORAL | Status: AC
  Administered 2023-10-14: 800 mg via ORAL
  Filled 2023-10-14: qty 2

## 2023-10-14 MED ORDER — ADULT MULTIVITAMIN W/MINERALS CH
1.0000 | ORAL_TABLET | Freq: Every day | ORAL | Status: DC
  Administered 2023-10-16 – 2023-10-17 (×2): 1 via ORAL
  Filled 2023-10-14 (×3): qty 1

## 2023-10-14 NOTE — ED Triage Notes (Signed)
 Pt arrived with [facility] from home after disloging suprapubic catheter. Wife able to replace catheter ( urine noted in bag) Wife also reported pt has never pulled out suprapubic catheter and has been more confused today. Baseline on 4L Alicia. EMS vitals 97.7, cbg 168, 130/90, ST 110. 24 g to R inner wrist, removed on arrival

## 2023-10-14 NOTE — Significant Event (Signed)
 Patient had an aspiration event while taking magnesium  tablet as reported by patient's nurse.  Became mildly short of breath and had repeated episodes of cough.  Symptoms got better after albuterol  nebulizer therapy.  Will check chest x-ray and get speech therapy evaluation.  Melf[place] Spotted.

## 2023-10-14 NOTE — ED Provider Notes (Signed)
 [place] EMERGENCY DEPARTMENT AT [facility] Provider Note  CSN: [id number] Arrival date & time: 10/14/23 1909  Chief Complaint(s) Altered Mental Status  HPI Christopher [name] is a 58 y.o. male with PMH chronic osteomyelitis on Cipro  and oral vancomycin  chronically, C. difficile, wheelchair-bound with suprapubic catheter who presents Emergency Department for evaluation of altered mental status.  States that over the last 24 hours patient has become progressively more confused and pulled out his suprapubic catheter.  The wife replaced the suprapubic catheter at home and there is adequate flow of urine through the suprapubic catheter.  Here in the emergency room, patient is altered and unable to provide additional history.   Past Medical History Past Medical History:  Diagnosis Date   Acute on chronic respiratory failure with hypoxia (HCC) 06/2018   trach removed 11-16-2018, on vent from jan until may 2020 - uses albuterol  prn   Anxiety    Bacteremia due to Pseudomonas 06/2018   Chronic osteomyelitis (HCC)    Chronic pain syndrome    Clostridium difficile colitis 10/30/2019   tx with abx    Depression    DVT (deep venous thrombosis) (HCC) 2020   right brachial post PICC line   History of blood transfusion 06/2018   History of Clostridioides difficile colitis    History of kidney stones    Hypertension    norvasc  d/c by pcp on 11/05/19   Multiple traumatic injuries    Penile pain 11/18/2019   Pneumonia 11/2009   2020 x 2   Walker as ambulation aid    Wheelchair bound    electric   Wound discharge    left hip wound with bloody/clear drainage change dressing q day surgilube with gauze, between legs wound using calcium  algenate pad bid   Patient Active Problem List   Diagnosis Date Noted   Dyslipidemia 05/05/2023   Metabolic syndrome 05/04/2023   At risk for Clostridium difficile infection 04/10/2023   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   At risk for complication of  anesthesia 10/29/2022   Therapeutic drug monitoring 10/27/2022   Bladder spasms 10/03/2022   Leg edema 09/30/2022   Bilateral inguinal hernia without obstruction or gangrene 07/23/2022   Encounter for care related to feeding tube 07/23/2022   Irritant contact dermatitis associated with fecal stoma 07/23/2022   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   Memory deficits 01/12/2022   Hypnagogic jerks 09/22/2021   Neuromuscular weakness (HCC) 06/24/2021   Depression, major, single episode, moderate (HCC) 06/24/2021   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   Chronic osteomyelitis of hip (HCC) 11/22/2020   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/22/2020   Dysphagia 07/02/2020   Therapeutic opioid induced constipation 04/11/2020   Status post peripherally inserted central catheter (PICC) central line placement 03/17/2020   Skin-picking disorder 03/12/2020   Recurrent pain of right knee 11/08/2019   Essential hypertension 11/05/2019   Myofascial pain dysfunction syndrome 08/05/2019   Complicated urinary tract infection 05/30/2019   Wound infection, posttraumatic 02/28/2019   Hydronephrosis concurrent with and due to calculi of kidney and ureter 02/28/2019   GERD (gastroesophageal reflux disease) 02/28/2019   Suprapubic catheter (HCC) 02/28/2019   Anxiety 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 01/29/2019   Chronic pain due to trauma 01/29/2019   Neuropathic pain 01/29/2019   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 Authorizing Provider  albuterol  (VENTOLIN  HFA) 108 (90 Base) MCG/ACT inhaler INHALE 2 PUFFS BY  MOUTH EVERY 6 HOURS AS NEEDED FOR WHEEZE OR SHORTNESS OF BREATH 10/13/23   Rodney Clamp, MD  BREZTRI  AEROSPHERE 160-9-4.8 MCG/ACT AERO INHALE 2 PUFFS INTO THE LUNGS IN THE MORNING AND AT BEDTIME. 06/15/23   Desai, Nikita S, MD  ciprofloxacin  (CIPRO ) 500 MG tablet Take 1 tablet (500 mg total) by mouth 2 (two) times daily. 10/12/23   Calone, Gregory D, FNP  famotidine  (PEPCID ) 40 MG tablet TAKE 1 TABLET BY MOUTH TWICE A DAY Patient taking differently: Take 40 mg by mouth at bedtime. 01/26/21   Lovorn, Megan, MD  fentaNYL  (DURAGESIC ) 75 MCG/HR Place 1 patch onto the skin every other day. ICD 10 code- G89.21- For chronic pain 10/03/23   Lovorn, Megan, MD  furosemide  (LASIX ) 40 MG tablet Take 1 tablet (40 mg total) by mouth daily. 04/04/23   Rodney Clamp, MD  gabapentin  (NEURONTIN ) 300 MG capsule Take 1 capsule (300 mg total) by mouth 3 (three) times daily. 06/12/23   Lovorn, Megan, MD  GEMTESA 75 MG TABS Take 75 mg by mouth daily. 09/26/22   [provider]  hydrochlorothiazide  (HYDRODIURIL ) 12.5 MG tablet TAKE 1 TABLET BY MOUTH EVERY DAY 07/03/23   Rodney Clamp, MD  imipramine  (TOFRANIL ) 25 MG tablet TAKE 1 TABLET (25 MG TOTAL) BY MOUTH IN THE MORNING, AT NOON, AND AT BEDTIME. FOR BLADDER SPASMS 05/24/23   Lovorn, Megan, MD  LINZESS  72 MCG capsule TAKE 2 CAPSULES (145 MCG TOTAL) BY MOUTH DAILY BEFORE BREAKFAST. Patient taking differently: Take 72 mcg by mouth daily before breakfast. 10/19/22   Rodney Clamp, MD  LORazepam  (ATIVAN ) 0.5 MG tablet Take 0.5 mg by mouth 3 (three) times daily. 03/25/22   [provider]  melatonin ([employer] MELATONIN) 3 MG TABS tablet Take 1 tablet (3 mg total) by mouth at bedtime. 11/01/19   Matcha, Anupama, MD  morphine  (MSIR) 15 MG tablet Take 1 tablet (15 mg total) by mouth every 3 (three) hours as needed for severe pain (pain score 7-10). G89.21- for chornic pain 10/03/23   Lovorn, Megan, MD  Multiple Vitamin (MULTIVITAMIN WITH MINERALS) TABS tablet Take 1  tablet by mouth daily. 03/13/19   Adrienne Horning, MD  naloxone  (NARCAN ) nasal spray 4 mg/0.1 mL To use if pt develops unconsciousness or confusion that family thinks is related to opioids. 02/06/23   Lovorn, Megan, MD  OLANZapine  (ZYPREXA ) 5 MG tablet Take 2.5-10 mg by mouth See admin instructions. 2.5 mgh in the morning, and midday, and 10 mg at bedtime 06/20/22   [provider]  oxybutynin  (DITROPAN ) 5 MG tablet Take 5 mg by mouth See admin instructions. Take 5 mg by mouth three times a day and an additional 5 mg once daily as needed for urinary urgency 11/13/19   [provider]  pantoprazole  (PROTONIX ) 40 MG tablet Take 1 tablet (40 mg total) by mouth daily. 05/22/23   Arlee Bellows, NP  PARoxetine  (PAXIL ) 40 MG tablet TAKE 1 TABLET BY MOUTH EVERYDAY AT BEDTIME 10/11/23   Lovorn, Jacqlyn Matas, MD  potassium chloride  SA (KLOR-CON  M) 20 MEQ tablet TAKE 1 TABLET BY MOUTH EVERY DAY 06/22/23   Parker, Caleb M, MD  Probiotic  Product (PROBIOTIC COLON SUPPORT) CAPS Take 1 capsule by mouth daily.    [provider]  promethazine  (PHENERGAN ) 12.5 MG tablet TAKE 1 TABLET (12.5 MG TOTAL) BY MOUTH EVERY 6 (SIX) HOURS AS NEEDED FOR NAUSEA, VOMITING OR REFRACTORY NAUSEA / VOMITING. 09/19/23   Lovorn, Megan, MD  senna-docusate (SENOKOT-S) 8.6-50 MG tablet Take 2 tablets by mouth 3 (three) times daily. 04/13/20   [provider]  Spacer/Aero-Holding Chambers (AEROCHAMBER MV) inhaler Use as instructed with MDI 04/14/23   Desai, Nikita S, MD  vancomycin  (VANCOCIN ) 125 MG capsule Take 1 capsule (125 mg total) by mouth in the morning and at bedtime. 09/26/23   Bubba Carbo, FNP                                                                                                                                    Past Surgical History Past Surgical History:  Procedure Laterality Date   APPLICATION OF A-CELL OF BACK N/A 08/06/2018   Procedure: Application Of A-Cell Of Back;  Surgeon: Thornell Flirt, DO;  [facility]: [facility] OR;  Service: Plastics;  Laterality: N/A;   APPLICATION OF A-CELL OF EXTREMITY Left 08/06/2018   Procedure: Application Of A-Cell Of Extremity;  Surgeon: Thornell Flirt, DO;  [facility]: [facility];  Service: Plastics;  Laterality: Left;   APPLICATION OF A-CELL OF EXTREMITY Left 09/18/2019   Procedure: APPLICATION OF A-CELL OF EXTREMITY;  Surgeon: Thornell Flirt, DO;  [facility]: [facility] OR;  Service: Plastics;  Laterality: Left;   APPLICATION OF WOUND VAC  07/12/2018   Procedure: Application Of Wound Vac to the Left Thigh and Scrotum.;  Surgeon: Laneta Pintos, MD;  [facility]: [facility];  Service: Orthopedics;;   APPLICATION OF WOUND VAC  07/10/2018   Procedure: Application Of Wound Vac;  Surgeon: Adalberto Acton, MD;  Location: [facility] OR;  Service: General;;   BIOPSY  11/02/2022   Procedure: BIOPSY;  Surgeon: Normie Becton., MD;  Location: [facility];  Service: Gastroenterology;;   COLON SURGERY  2020   colostomy   COLOSTOMY N/A 07/23/2018   Procedure: COLOSTOMY;  Surgeon: Dorena Gander, MD;  Location: [facility] OR;  Service: General;  Laterality: N/A;   CYSTOSCOPY W/ URETERAL STENT PLACEMENT N/A 07/15/2018   Procedure: RETROGRADE URETHROGRAM;  Surgeon: Trent Frizzle, MD;  Location: [facility] OR;  Service: Urology;  Laterality: N/A;   CYSTOSCOPY WITH LITHOLAPAXY N/A 05/06/2019   Procedure: CYSTOSCOPY BASKET BLADDER STONE EXTRACTION;  Surgeon: Marco Severs, MD;  Location: [facility];  Service: Urology;  Laterality: N/A;  30 MINS   CYSTOSTOMY N/A 05/06/2019   Procedure: REPLACEMENT OF SUPRAPUBIC CATHETER;  Surgeon: Marco Severs, MD;  Location: [facility];  Service: Urology;  Laterality: N/A;   DEBRIDEMENT AND CLOSURE WOUND Left 03/04/2019   Procedure: Excision of hip wound with placement of Acell;  Surgeon: Thornell Flirt, DO;  [facility];  Service: Government social research officer;  Laterality: Left;   ESOPHAGOGASTRODUODENOSCOPY N/A 08/14/2018    Procedure: ESOPHAGOGASTRODUODENOSCOPY (EGD);  Surgeon: Dorena Gander, MD;  Location: [facility] ENDOSCOPY;  Service: General;  Laterality: N/A;  bedside   ESOPHAGOGASTRODUODENOSCOPY (EGD) WITH PROPOFOL  N/A 11/02/2022   Procedure: ESOPHAGOGASTRODUODENOSCOPY (EGD) WITH PROPOFOL ;  Surgeon: Normie Becton., MD;  Location: [facility];  Service: Gastroenterology;  Laterality: N/A;   FACIAL RECONSTRUCTION SURGERY     X 2--once as a teenager and second time in his 31's   HARDWARE REMOVAL Left 03/04/2019   Procedure: Left Hip Hardware Removal;  Surgeon: Laneta Pintos, MD;  [facility];  Service: Orthopedics;  Laterality: Left;   HEMOSTASIS CLIP PLACEMENT  11/02/2022   Procedure: HEMOSTASIS CLIP PLACEMENT;  Surgeon: Normie Becton., MD;  Location: [facility];  Service: Gastroenterology;;   HIP PINNING,CANNULATED Left 07/12/2018   Procedure: CANNULATED HIP PINNING;  Surgeon: Laneta Pintos, MD;  [facility];  Service: Orthopedics;  Laterality: Left;   HIP SURGERY     HOLMIUM LASER APPLICATION Right 07/15/2019   Procedure: HOLMIUM LASER APPLICATION;  Surgeon: Marco Severs, MD;  Location: [facility];  Service: Urology;  Laterality: Right;   HOT HEMOSTASIS N/A 11/02/2022   Procedure: HOT HEMOSTASIS (ARGON PLASMA COAGULATION/BICAP);  Surgeon: Normie Becton., MD;  Location: [facility];  Service: Gastroenterology;  Laterality: N/A;   I & D EXTREMITY Left 07/25/2018   Procedure: Debridement of buttock, scrotum and left leg, placement of acell and vac;  Surgeon: Thornell Flirt, DO;  [facility];  Service: Plastics;  Laterality: Left;   I & D EXTREMITY N/A 08/06/2018   Procedure: Debridement of buttock, scrotum and left leg;  Surgeon: Thornell Flirt, DO;  [facility];  Service: Plastics;  Laterality: N/A;   I & D EXTREMITY N/A 08/13/2018   Procedure: Debridement of buttock, scrotum and left leg, placement of acell and vac;  Surgeon: Thornell Flirt, DO;  [facility];  Service: Plastics;  Laterality: N/A;  90 min, please   INCISION AND DRAINAGE HIP Left 09/18/2019   Procedure: IRRIGATION AND DEBRIDEMENT HIP/ PELVIS WITH WOUND VAC PLACEMENT;  Surgeon: Laneta Pintos, MD;  [facility];  Service: Orthopedics;  Laterality: Left;   INCISION AND DRAINAGE OF WOUND N/A 07/18/2018   Procedure: Debridement of left leg, buttocks and scrotal wound with placement of acell and Flexiseal;  Surgeon: Thornell Flirt, DO;  [facility];  Service: Plastics;  Laterality: N/A;   INCISION AND DRAINAGE OF WOUND Left 08/29/2018   Procedure: Debridement of buttock, scrotum and left leg, placement of acell and vac;  Surgeon: Thornell Flirt, DO;  [facility];  Service: Plastics;  Laterality: Left;  75 min, please   INCISION AND DRAINAGE OF WOUND Bilateral 10/23/2018   Procedure: DEBRIDEMENT OF BUTTOCK,SCROTUM, AND LEG WOUNDS WITH PLACEMENT OF ACELL- BILATERAL 90 MIN;  Surgeon: Thornell Flirt, DO;  [facility];  Service: Plastics;  Laterality: Bilateral;   IR ANGIOGRAM PELVIS SELECTIVE OR SUPRASELECTIVE  07/10/2018   IR ANGIOGRAM PELVIS SELECTIVE OR SUPRASELECTIVE  07/10/2018   IR ANGIOGRAM SELECTIVE EACH ADDITIONAL VESSEL  07/10/2018   IR EMBO ART  VEN HEMORR LYMPH EXTRAV  INC GUIDE ROADMAPPING  07/10/2018   IR GASTROSTOMY TUBE MOD SED  01/21/2021   IR NEPHROSTOMY PLACEMENT LEFT  04/05/2019   IR NEPHROSTOMY PLACEMENT RIGHT  05/31/2019   IR US  GUIDE BX ASP/DRAIN  07/10/2018   IR US  GUIDE VASC  ACCESS RIGHT  07/10/2018   IR VENO/EXT/UNI LEFT  07/10/2018   IRRIGATION AND DEBRIDEMENT OF WOUND WITH SPLIT THICKNESS SKIN GRAFT Left 09/19/2018   Procedure: Debridement of gluteal wound with placement of acell to left leg.;  Surgeon: Thornell Flirt, DO;  [facility];  Service: Plastics;  Laterality: Left;  2.5 hours, please   LAPAROTOMY N/A 07/12/2018   Procedure: EXPLORATORY LAPAROTOMY;  Surgeon: Dorena Gander, MD;  Location: [facility] OR;   Service: General;  Laterality: N/A;   LAPAROTOMY N/A 07/15/2018   Procedure: WOUND EXPLORATION; CLOSURE OF ABDOMEN;  Surgeon: Dorena Gander, MD;  Location: [facility] OR;  Service: General;  Laterality: N/A;   LAPAROTOMY  07/10/2018   Procedure: Exploratory Laparotomy;  Surgeon: Adalberto Acton, MD;  Location: [facility] OR;  Service: General;;   MASS EXCISION Left 09/18/2019   Procedure: EXCISION UPPER LEFT INNER THIGH WOUND;  Surgeon: Thornell Flirt, DO;  [facility];  Service: Plastics;  Laterality: Left;   NEPHROLITHOTOMY Right 07/15/2019   Procedure: NEPHROLITHOTOMY PERCUTANEOUS;  Surgeon: Marco Severs, MD;  Location: [facility];  Service: Urology;  Laterality: Right;  90 MINS   PEG PLACEMENT N/A 08/14/2018   Procedure: PERCUTANEOUS ENDOSCOPIC GASTROSTOMY (PEG) PLACEMENT;  Surgeon: Dorena Gander, MD;  Location: [facility] ENDOSCOPY;  Service: General;  Laterality: N/A;   PERCUTANEOUS TRACHEOSTOMY N/A 08/02/2018   Procedure: PERCUTANEOUS TRACHEOSTOMY;  Surgeon: Dorena Gander, MD;  Location: [facility] OR;  Service: General;  Laterality: N/A;   RADIOLOGY WITH ANESTHESIA N/A 07/10/2018   Procedure: IR WITH ANESTHESIA;  Surgeon: Robbi Childs, MD;  Location: [facility] OR;  Service: Radiology;  Laterality: N/A;   RADIOLOGY WITH ANESTHESIA Right 07/10/2018   Procedure: Ir With Anesthesia;  Surgeon: Robbi Childs, MD;  Location: [facility] OR;  Service: Radiology;  Laterality: Right;   SAVORY DILATION N/A 11/02/2022   Procedure: SAVORY DILATION;  Surgeon: Brice Campi Albino Alu., MD;  Location: [facility];  Service: Gastroenterology;  Laterality: N/A;   SCROTAL EXPLORATION N/A 07/15/2018   Procedure: SCROTUM DEBRIDEMENT;  Surgeon: Trent Frizzle, MD;  Location: [facility] OR;  Service: Urology;  Laterality: N/A;   SHOULDER SURGERY     SKIN SPLIT GRAFT Right 09/19/2018   Procedure: Skin Graft Split Thickness;  Surgeon: Thornell Flirt, DO;  [facility];  Service: Plastics;  Laterality: Right;   SKIN SPLIT GRAFT N/A  10/03/2018   Procedure: Split thickness skin graft to gluteal area with acell placement;  Surgeon: Thornell Flirt, DO;  [facility];  Service: Plastics;  Laterality: N/A;  3 hours, please   VACUUM ASSISTED CLOSURE CHANGE N/A 07/12/2018   Procedure: ABDOMINAL VACUUM ASSISTED CLOSURE CHANGE and abdominal washout;  Surgeon: Dorena Gander, MD;  Location[facility] OR;  Service: General;  Laterality: N/A;   WOUND DEBRIDEMENT Left 07/23/2018   Procedure: DEBRIDEMENT LEFT BUTTOCK  WOUND;  Surgeon: Dorena Gander, MD;  Location: [facility] OR;  Service: General;  Laterality: Left;   WOUND EXPLORATION Left 07/10/2018   Procedure: WOUND EXPLORATION LEFT GROIN;  Surgeon: Adalberto Acton, MD;  [facility];  Service: General;  Laterality: Left;   Family History Family History  Problem Relation Age of Onset   Breast cancer Mother        with mets to the bones    Social History Social History   Tobacco Use   Smoking status: Former    Current packs/day: 0.00    Average packs/day: 1 pack/day for 20.0 years (20.0 ttl pk-yrs)    Types: Cigarettes  Start date: 07/10/1998    Quit date: 07/10/2018    Years since quitting: 5.2   Smokeless tobacco: Never  Vaping Use   Vaping status: Never Used  Substance Use Topics   Alcohol use: Never   Drug use: Yes    Types: Oxycodone , Fentanyl     Comment: Fentanyl  patch/oxycodone  since 06/2018   Allergies Methadone   Review of Systems Review of Systems  Unable to perform ROS: Mental status change    Physical Exam Vital Signs  I have reviewed the triage vital signs BP 129/85   Pulse (!) 111   Temp 98.3 F (36.8 C)   Resp 18   SpO2 99%   Physical Exam Constitutional:      General: He is not in acute distress.    Appearance: Normal appearance.  HENT:     Head: Normocephalic and atraumatic.     Nose: No congestion or rhinorrhea.  Eyes:     General:        Right eye: No discharge.        Left eye: No discharge.     Extraocular Movements:  Extraocular movements intact.     Pupils: Pupils are equal, round, and reactive to light.  Cardiovascular:     Rate and Rhythm: Normal rate and regular rhythm.     Heart sounds: No murmur heard. Pulmonary:     Effort: No respiratory distress.     Breath sounds: No wheezing or rales.  Abdominal:     General: There is no distension.     Tenderness: There is no abdominal tenderness.  Musculoskeletal:        General: Normal range of motion.     Cervical back: Normal range of motion.  Skin:    General: Skin is warm and dry.  Neurological:     General: No focal deficit present.     Mental Status: He is alert. He is disoriented.     ED Results and Treatments Labs (all labs ordered are listed, but only abnormal results are displayed) Labs Reviewed  COMPREHENSIVE METABOLIC PANEL WITH GFR - Abnormal; Notable for the following components:      Result Value   Potassium 3.4 (*)    Glucose, Bld 118 (*)    AST 49 (*)    All other components within normal limits  URINALYSIS, ROUTINE W REFLEX MICROSCOPIC - Abnormal; Notable for the following components:   APPearance CLOUDY (*)    Hgb urine dipstick LARGE (*)    Protein, ur 100 (*)    Leukocytes,Ua LARGE (*)    Bacteria, UA MANY (*)    All other components within normal limits  CBG MONITORING, ED - Abnormal; Notable for the following components:   Glucose-Capillary 119 (*)    All other components within normal limits  CULTURE, BLOOD (ROUTINE X 2)  CULTURE, BLOOD (ROUTINE X 2)  CBC WITH DIFFERENTIAL/PLATELET  Radiology No results found.  Pertinent labs & imaging results that were available during my care of the patient were reviewed by me and considered in my medical decision making (see MDM for details).  Medications Ordered in ED Medications  cefTRIAXone  (ROCEPHIN ) 2 g in sodium chloride  0.9 % 100 mL IVPB (has  no administration in time range)  potassium chloride  SA (KLOR-CON  M) CR tablet 40 mEq (has no administration in time range)  magnesium  oxide (MAG-OX) tablet 800 mg (has no administration in time range)  lactated ringers  bolus 1,000 mL (has no administration in time range)                                                                                                                                     Procedures Procedures  (including critical care time)  Medical Decision Making / ED Course   This patient presents to the ED for concern of altered mental status, this involves an extensive number of treatment options, and is a complaint that carries with it a high risk of complications and morbidity.  The differential diagnosis includes infection, metabolic/toxic encephalopathy, hypoglycemia, malperfusion, hypoxia, trauma or other intracranial process  MDM: Patient seen emergency room for evaluation of altered mental status.  Physical exam reveals a disoriented patient but physical exam otherwise unremarkable.  Suprapubic catheter site does not appear infected.  Laboratory evaluation with mild hypokalemia to 3.4 but is otherwise unremarkable.  Urinalysis is concerning for infection with large leuk esterase, greater than 50 red blood cells, greater than 50 white blood cells and many bacteria.  Review of previous micro data does show multidrug resistance but urine culture is usually sensitive to ceftriaxone  and thus ceftriaxone  started.  Will require hospital mission for altered mental status urinary tract infection.   Additional history obtained:  -External records from outside source obtained and reviewed including: Chart review including previous notes, labs, imaging, consultation notes   Lab Tests: -I ordered, reviewed, and interpreted labs.   The pertinent results include:   Labs Reviewed  COMPREHENSIVE METABOLIC PANEL WITH GFR - Abnormal; Notable for the following components:       Result Value   Potassium 3.4 (*)    Glucose, Bld 118 (*)    AST 49 (*)    All other components within normal limits  URINALYSIS, ROUTINE W REFLEX MICROSCOPIC - Abnormal; Notable for the following components:   APPearance CLOUDY (*)    Hgb urine dipstick LARGE (*)    Protein, ur 100 (*)    Leukocytes,Ua LARGE (*)    Bacteria, UA MANY (*)    All other components within normal limits  CBG MONITORING, ED - Abnormal; Notable for the following components:   Glucose-Capillary 119 (*)    All other components within normal limits  CULTURE, BLOOD (ROUTINE X 2)  CULTURE, BLOOD (ROUTINE X 2)  CBC WITH DIFFERENTIAL/PLATELET     Medicines ordered and prescription drug  management: Meds ordered this encounter  Medications   cefTRIAXone  (ROCEPHIN ) 2 g in sodium chloride  0.9 % 100 mL IVPB    Antibiotic Indication::   UTI   potassium chloride  SA (KLOR-CON  M) CR tablet 40 mEq   magnesium  oxide (MAG-OX) tablet 800 mg   lactated ringers  bolus 1,000 mL    -I have reviewed the patients home medicines and have made adjustments as needed  Critical interventions none    Cardiac Monitoring: The patient was maintained on a cardiac monitor.  I personally viewed and interpreted the cardiac monitored which showed an underlying rhythm of: NSR  Social Determinants of Health:  Factors impacting patients care include: none   Reevaluation: After the interventions noted above, I reevaluated the patient and found that they have :stayed the same  Co morbidities that complicate the patient evaluation  Past Medical History:  Diagnosis Date   Acute on chronic respiratory failure with hypoxia (HCC) 06/2018   trach removed 11-16-2018, on vent from jan until may 2020 - uses albuterol  prn   Anxiety    Bacteremia due to Pseudomonas 06/2018   Chronic osteomyelitis (HCC)    Chronic pain syndrome    Clostridium difficile colitis 10/30/2019   tx with abx    Depression    DVT (deep venous thrombosis) (HCC) 2020    right brachial post PICC line   History of blood transfusion 06/2018   History of Clostridioides difficile colitis    History of kidney stones    Hypertension    norvasc  d/c by pcp on 11/05/19   Multiple traumatic injuries    Penile pain 11/18/2019   Pneumonia 11/2009   2020 x 2   Walker as ambulation aid    Wheelchair bound    electric   Wound discharge    left hip wound with bloody/clear drainage change dressing q day surgilube with gauze, between legs wound using calcium  algenate pad bid      Dispostion: I considered admission for this patient, and patient will require hospital admission for altered mental status urinary tract infection     Final Clinical Impression(s) / ED Diagnoses Final diagnoses:  Altered mental status, unspecified altered mental status type  Acute cystitis with hematuria     @PCDICTATION @    Karlyn Overman, MD 10/14/23 2048
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
## 2023-10-14 NOTE — ED Notes (Signed)
 Pt noted sitting at end of the bed, states they normally swing their legs, baseline wheelchair bound. Pt oriented to self and place only. Assisted back into bed, HOB raise for comfort, posey pad alarm placed for safety, call light within reach. Pt denies any other needs at this time.

## 2023-10-15 DIAGNOSIS — R4182 Altered mental status, unspecified: Secondary | ICD-10-CM

## 2023-10-15 DIAGNOSIS — Z993 Dependence on wheelchair: Secondary | ICD-10-CM | POA: Diagnosis not present

## 2023-10-15 DIAGNOSIS — Y828 Other medical devices associated with adverse incidents: Secondary | ICD-10-CM | POA: Diagnosis present

## 2023-10-15 DIAGNOSIS — G9341 Metabolic encephalopathy: Secondary | ICD-10-CM | POA: Diagnosis present

## 2023-10-15 DIAGNOSIS — N3001 Acute cystitis with hematuria: Secondary | ICD-10-CM | POA: Diagnosis present

## 2023-10-15 DIAGNOSIS — G9340 Encephalopathy, unspecified: Secondary | ICD-10-CM | POA: Diagnosis present

## 2023-10-15 DIAGNOSIS — T83518A Infection and inflammatory reaction due to other urinary catheter, initial encounter: Secondary | ICD-10-CM | POA: Diagnosis present

## 2023-10-15 DIAGNOSIS — K219 Gastro-esophageal reflux disease without esophagitis: Secondary | ICD-10-CM | POA: Diagnosis present

## 2023-10-15 DIAGNOSIS — J9611 Chronic respiratory failure with hypoxia: Secondary | ICD-10-CM | POA: Diagnosis present

## 2023-10-15 DIAGNOSIS — N3289 Other specified disorders of bladder: Secondary | ICD-10-CM | POA: Diagnosis present

## 2023-10-15 DIAGNOSIS — A419 Sepsis, unspecified organism: Secondary | ICD-10-CM | POA: Diagnosis present

## 2023-10-15 DIAGNOSIS — Z933 Colostomy status: Secondary | ICD-10-CM | POA: Diagnosis not present

## 2023-10-15 DIAGNOSIS — Z87891 Personal history of nicotine dependence: Secondary | ICD-10-CM | POA: Diagnosis not present

## 2023-10-15 DIAGNOSIS — L89153 Pressure ulcer of sacral region, stage 3: Secondary | ICD-10-CM | POA: Diagnosis present

## 2023-10-15 DIAGNOSIS — Z79899 Other long term (current) drug therapy: Secondary | ICD-10-CM | POA: Diagnosis not present

## 2023-10-15 DIAGNOSIS — I10 Essential (primary) hypertension: Secondary | ICD-10-CM | POA: Diagnosis present

## 2023-10-15 DIAGNOSIS — Z9981 Dependence on supplemental oxygen: Secondary | ICD-10-CM | POA: Diagnosis not present

## 2023-10-15 DIAGNOSIS — Z1623 Resistance to quinolones and fluoroquinolones: Secondary | ICD-10-CM | POA: Diagnosis present

## 2023-10-15 DIAGNOSIS — E785 Hyperlipidemia, unspecified: Secondary | ICD-10-CM | POA: Diagnosis present

## 2023-10-15 DIAGNOSIS — F419 Anxiety disorder, unspecified: Secondary | ICD-10-CM | POA: Diagnosis present

## 2023-10-15 DIAGNOSIS — B9620 Unspecified Escherichia coli [E. coli] as the cause of diseases classified elsewhere: Secondary | ICD-10-CM | POA: Diagnosis present

## 2023-10-15 DIAGNOSIS — Z888 Allergy status to other drugs, medicaments and biological substances status: Secondary | ICD-10-CM | POA: Diagnosis not present

## 2023-10-15 DIAGNOSIS — G894 Chronic pain syndrome: Secondary | ICD-10-CM | POA: Diagnosis present

## 2023-10-15 DIAGNOSIS — M8668 Other chronic osteomyelitis, other site: Secondary | ICD-10-CM | POA: Diagnosis present

## 2023-10-15 DIAGNOSIS — G709 Myoneural disorder, unspecified: Secondary | ICD-10-CM | POA: Diagnosis present

## 2023-10-15 DIAGNOSIS — E876 Hypokalemia: Secondary | ICD-10-CM | POA: Diagnosis present

## 2023-10-15 DIAGNOSIS — F32A Depression, unspecified: Secondary | ICD-10-CM | POA: Diagnosis present

## 2023-10-15 LAB — COMPREHENSIVE METABOLIC PANEL WITH GFR
ALT: 33 U/L (ref 0–44)
AST: 26 U/L (ref 15–41)
Albumin: 3.2 g/dL — ABNORMAL LOW (ref 3.5–5.0)
Alkaline Phosphatase: 91 U/L (ref 38–126)
Anion gap: 8 (ref 5–15)
BUN: 10 mg/dL (ref 6–20)
CO2: 27 mmol/L (ref 22–32)
Calcium: 9.4 mg/dL (ref 8.9–10.3)
Chloride: 103 mmol/L (ref 98–111)
Creatinine, Ser: 0.71 mg/dL (ref 0.61–1.24)
GFR, Estimated: >60 mL/min (ref >60)
Glucose, Bld: 112 mg/dL — ABNORMAL HIGH (ref 70–99)
Potassium: 3.4 mmol/L — ABNORMAL LOW (ref 3.5–5.1)
Sodium: 138 mmol/L (ref 135–145)
Total Bilirubin: 0.5 mg/dL (ref 0.0–1.2)
Total Protein: 6.5 g/dL (ref 6.5–8.1)

## 2023-10-15 LAB — CBC
HCT: 36.7 % — ABNORMAL LOW (ref 39.0–52.0)
Hemoglobin: 12.2 g/dL — ABNORMAL LOW (ref 13.0–17.0)
MCH: 30.9 pg (ref 26.0–34.0)
MCHC: 33.2 g/dL (ref 30.0–36.0)
MCV: 92.9 fL (ref 80.0–100.0)
Platelets: 214 10*3/uL (ref 150–400)
RBC: 3.95 MIL/uL — ABNORMAL LOW (ref 4.22–5.81)
RDW: 13.2 % (ref 11.5–15.5)
WBC: 6.9 10*3/uL (ref 4.0–10.5)
nRBC: 0 % (ref 0.0–0.2)

## 2023-10-15 LAB — TSH: TSH: 0.28 u[IU]/mL — ABNORMAL LOW (ref 0.350–4.500)
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
## 2023-10-15 NOTE — ED Notes (Signed)
 Unable to administer imipramine  currently with other meds d/t contraindications with paroxetine 

## 2023-10-15 NOTE — ED Notes (Signed)
 27M given courtesy call

## 2023-10-15 NOTE — Consult Note (Signed)
 WOC Nurse Consult Note: Reason for Consult: left hip and colostomy Wound type: Full thickness non healing hip wound; multiple debridements and I&D of this hip per ortho; admitted with packing strip in place  Stage 3 Pressure Injury; sacrum;100% pink Pressure Injury POA: Yes Measurement:see nursing flow sheets Wound bed: hip is a tunnel, not able to visualize wound bed; sacrum-see above  Drainage (amount, consistency, odor) see nursing flow sheets Periwound; intact  Dressing procedure/placement/frequency: 1/2" iodoform packing to the left hip [employer] # [phone]) Silicone foam to the sacral wound; change every other day.  Low air loss mattress for moisture management and pressure redistribution  Patient with ostomy since 2020, uses 2pc 2 3/4" flat with barrier ring. Provided staff with appropriate [employer] numbers for supplies  Derrious Bologna [facility], CNS, [employer] [phone]

## 2023-10-15 NOTE — Progress Notes (Signed)
 SLP Cancellation Note  Patient Details Name: Christopher [name] MRN: [id number] DOB: 06-23-1965   Cancelled treatment:       Reason Eval/Treat Not Completed: Medical issues which prohibited therapy  Per RN patient is not alert enough to participate in swallowing evaluation.  ST will continue efforts.   Thomes Flicker, MA, CCC-SLP Acute Rehab SLP [phone]  Christopher [name] 10/15/2023, 10:25 AM

## 2023-10-16 ENCOUNTER — Encounter: Payer: Self-pay | Admitting: Physical Medicine and Rehabilitation

## 2023-10-16 LAB — COMPREHENSIVE METABOLIC PANEL WITH GFR
ALT: 32 U/L (ref 0–44)
AST: 29 U/L (ref 15–41)
Albumin: 3.3 g/dL — ABNORMAL LOW (ref 3.5–5.0)
Alkaline Phosphatase: 90 U/L (ref 38–126)
Anion gap: 8 (ref 5–15)
BUN: 10 mg/dL (ref 6–20)
CO2: 26 mmol/L (ref 22–32)
Calcium: 9.7 mg/dL (ref 8.9–10.3)
Chloride: 106 mmol/L (ref 98–111)
Creatinine, Ser: 0.56 mg/dL — ABNORMAL LOW (ref 0.61–1.24)
GFR, Estimated: >60 mL/min (ref >60)
Glucose, Bld: 108 mg/dL — ABNORMAL HIGH (ref 70–99)
Potassium: 3.5 mmol/L (ref 3.5–5.1)
Sodium: 140 mmol/L (ref 135–145)
Total Bilirubin: 0.4 mg/dL (ref 0.0–1.2)
Total Protein: 6.8 g/dL (ref 6.5–8.1)

## 2023-10-16 LAB — CBC
HCT: 40.8 % (ref 39.0–52.0)
Hemoglobin: 13.4 g/dL (ref 13.0–17.0)
MCH: 31 pg (ref 26.0–34.0)
MCHC: 32.8 g/dL (ref 30.0–36.0)
MCV: 94.4 fL (ref 80.0–100.0)
Platelets: 215 10*3/uL (ref 150–400)
RBC: 4.32 MIL/uL (ref 4.22–5.81)
RDW: 13.1 % (ref 11.5–15.5)
WBC: 4.2 10*3/uL (ref 4.0–10.5)
nRBC: 0 % (ref 0.0–0.2)

## 2023-10-16 NOTE — Progress Notes (Signed)
 Patient refused wound care, states he will wait until he get home for his wife to do it, " she knows how to do it right". MD was made aware
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
## 2023-10-17 ENCOUNTER — Other Ambulatory Visit (HOSPITAL_COMMUNITY): Payer: Self-pay

## 2023-10-17 LAB — COMPREHENSIVE METABOLIC PANEL WITH GFR
ALT: 33 U/L (ref 0–44)
AST: 31 U/L (ref 15–41)
Albumin: 3 g/dL — ABNORMAL LOW (ref 3.5–5.0)
Alkaline Phosphatase: 79 U/L (ref 38–126)
Anion gap: 11 (ref 5–15)
BUN: 11 mg/dL (ref 6–20)
CO2: 25 mmol/L (ref 22–32)
Calcium: 9.3 mg/dL (ref 8.9–10.3)
Chloride: 104 mmol/L (ref 98–111)
Creatinine, Ser: 0.66 mg/dL (ref 0.61–1.24)
GFR, Estimated: >60 mL/min (ref >60)
Glucose, Bld: 93 mg/dL (ref 70–99)
Potassium: 3.2 mmol/L — ABNORMAL LOW (ref 3.5–5.1)
Sodium: 140 mmol/L (ref 135–145)
Total Bilirubin: 0.7 mg/dL (ref 0.0–1.2)
Total Protein: 6.2 g/dL — ABNORMAL LOW (ref 6.5–8.1)

## 2023-10-17 LAB — CBC
HCT: 37.4 % — ABNORMAL LOW (ref 39.0–52.0)
Hemoglobin: 12.1 g/dL — ABNORMAL LOW (ref 13.0–17.0)
MCH: 30.2 pg (ref 26.0–34.0)
MCHC: 32.4 g/dL (ref 30.0–36.0)
MCV: 93.3 fL (ref 80.0–100.0)
Platelets: 235 10*3/uL (ref 150–400)
RBC: 4.01 MIL/uL — ABNORMAL LOW (ref 4.22–5.81)
RDW: 13.1 % (ref 11.5–15.5)
WBC: 5.1 10*3/uL (ref 4.0–10.5)
nRBC: 0 % (ref 0.0–0.2)

## 2023-10-17 LAB — URINE CULTURE: Culture: 90000 — AB

## 2023-10-17 MED ORDER — POTASSIUM CHLORIDE CRYS ER 20 MEQ PO TBCR
40.0000 meq | EXTENDED_RELEASE_TABLET | ORAL | Status: AC
  Administered 2023-10-17 (×2): 40 meq via ORAL
  Filled 2023-10-17 (×2): qty 2

## 2023-10-17 MED ORDER — CEPHALEXIN 500 MG PO CAPS
500.0000 mg | ORAL_CAPSULE | Freq: Two times a day (BID) | ORAL | 0 refills | Status: AC
  Filled 2023-10-17: qty 10, 5d supply, fill #0
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
## 2023-10-17 NOTE — Plan of Care (Signed)
  Problem: Education: Goal: Knowledge of General Education information will improve Description: Including pain rating scale, medication(s)/side effects and non-pharmacologic comfort measures Outcome: Completed/Met   Problem: Education: Goal: Knowledge of General Education information will improve Description: Including pain rating scale, medication(s)/side effects and non-pharmacologic comfort measures Outcome: Completed/Met

## 2023-10-17 NOTE — TOC Transition Note (Signed)
 Transition of Care [facility]) - Discharge Note   Patient Details  Name: Christopher [name] MRN: [id number] Date of Birth: [birth year]  Transition of Care [facility]) CM/SW Contact:  Tom-Johnson, Rashan Patient [name], RN Phone Number: 10/17/2023, 2:34 PM   Clinical Narrative:     Patient is scheduled for discharge today.  Readmission Risk Assessment done. Home health resumption of care info, hospital f/u and discharge instructions on AVS. Prescriptions sent to [facility] pharmacy and patient will receive meds prior discharge. [facility] scheduled to transport at discharge.  No further TOC needs noted.        Final next level of care: Home w Home Health Services Barriers to Discharge: Barriers Resolved   Patient Goals and CMS Choice Patient states their goals for this hospitalization and ongoing recovery are:: To return home CMS Medicare.gov Compare Post Acute Care list provided to:: Patient Choice offered to / list presented to : Patient, Spouse      Discharge Placement                Patient to be transferred to facility by: PTAR      Discharge Plan and Services Additional resources added to the After Visit Summary for                  DME Arranged: N/A DME Agency: NA       HH Arranged: PT, RN HH Agency: [facility] Date HH Agency Contacted: 10/17/23 Time HH Agency Contacted: 1430 Representative spoke with at [facility] Agency: Renetta Carter  Social Drivers of Health (SDOH) Interventions SDOH Screenings   Food Insecurity: No Food Insecurity (10/15/2023)  Housing: Low Risk  (10/15/2023)  Transportation Needs: No Transportation Needs (10/15/2023)  Utilities: Not At Risk (10/15/2023)  Depression (PHQ2-9): Low Risk  (06/12/2023)  Recent Concern: Depression (PHQ2-9) - High Risk (06/01/2023)  Financial Resource Strain: Low Risk  (03/01/2019)  Physical Activity: Inactive (03/01/2019)  Stress: Stress Concern Present (03/01/2019)  Tobacco Use: Medium Risk (10/14/2023)     Readmission  Risk Interventions    10/16/2023    3:13 PM  Readmission Risk Prevention Plan  Transportation Screening Complete  PCP or Specialist Appt within 5-7 Days Complete  Home Care Screening Complete

## 2023-10-18 ENCOUNTER — Telehealth: Payer: Self-pay

## 2023-10-18 NOTE — Transitions of Care (Post Inpatient/ED Visit) (Signed)
 10/18/2023  Name: Christopher [name] MRN: [id number] DOB: May 29, 1966  Today's TOC FU Call Status: Today's TOC FU Call Status:: Successful TOC FU Call Completed TOC FU Call Complete Date: 10/18/23 Patient's Name and Date of Birth confirmed.  Transition Care Management Follow-up Telephone Call Date of Discharge: 10/17/23 Discharge Facility[facility]) Type of Discharge: Inpatient Admission Primary Inpatient Discharge Diagnosis:: cystitis How have you been since you were released from the hospital?: Better Any questions or concerns?: No  Items Reviewed: Did you receive and understand the discharge instructions provided?: Yes Medications obtained,verified, and reconciled?: Yes (Medications Reviewed) Any new allergies since your discharge?: No Dietary orders reviewed?: NA Do you have support at home?: Yes People in Home [RPT]: spouse  Medications Reviewed Today: Medications Reviewed Today     Reviewed by Darrall Ellison, LPN (Licensed Practical Nurse) on 10/18/23 at 1522  Med List Status: <None>   Medication Order Taking? Sig Documenting Provider Last Dose Status Informant  albuterol  (VENTOLIN  HFA) 108 (90 Base) MCG/ACT inhaler [id number] No INHALE 2 PUFFS BY MOUTH EVERY 6 HOURS AS NEEDED FOR WHEEZE OR SHORTNESS OF BREATH Rodney Clamp, MD 10/14/2023 Evening Active Spouse/Significant Other  BREZTRI  AEROSPHERE 160-9-4.8 MCG/ACT AERO [id number] No INHALE 2 PUFFS INTO THE LUNGS IN THE MORNING AND AT BEDTIME. Desai, Nikita S, MD 10/14/2023 Morning Active Spouse/Significant Other  cephALEXin  (KEFLEX ) 500 MG capsule [id number]  Take 1 capsule (500 mg total) by mouth 2 (two) times daily for 5 days. Oral Billings, MD  Active   ciprofloxacin  (CIPRO ) 500 MG tablet [id number] No Take 1 tablet (500 mg total) by mouth 2 (two) times daily. Calone, Gregory D, FNP 10/14/2023 Morning Active Spouse/Significant Other  famotidine  (PEPCID ) 40 MG tablet [id number] No TAKE 1 TABLET BY MOUTH TWICE A DAY   Patient taking differently: Take 40 mg by mouth daily.   Lovorn, Megan, MD 10/14/2023 Morning Active Spouse/Significant Other  fentaNYL  (DURAGESIC ) 75 MCG/HR [id number] No Place 1 patch onto the skin every other day. ICD 10 code- G89.21- For chronic pain Lovorn, Megan, MD 10/13/2023 Evening Active Spouse/Significant Other  furosemide  (LASIX ) 40 MG tablet [id number] No Take 1 tablet (40 mg total) by mouth daily. Rodney Clamp, MD 10/14/2023 Morning Active Spouse/Significant Other  gabapentin  (NEURONTIN ) 300 MG capsule [id number] No Take 1 capsule (300 mg total) by mouth 3 (three) times daily. Lovorn, Megan, MD 10/14/2023 Noon Active Spouse/Significant Other  GEMTESA 75 MG TABS [id number] No Take 75 mg by mouth daily. [provider] 10/14/2023 Morning Active Spouse/Significant Other  hydrochlorothiazide  (HYDRODIURIL ) 12.5 MG tablet [id number] No TAKE 1 TABLET BY MOUTH EVERY DAY Rodney Clamp, MD 10/14/2023 Morning Active Spouse/Significant Other  imipramine  (TOFRANIL ) 25 MG tablet [id number] No TAKE 1 TABLET (25 MG TOTAL) BY MOUTH IN THE MORNING, AT NOON, AND AT BEDTIME. FOR BLADDER SPASMS Lovorn, Megan, MD 10/14/2023 Noon Active Spouse/Significant Other  LINZESS  72 MCG capsule [id number] No TAKE 2 CAPSULES (145 MCG TOTAL) BY MOUTH DAILY BEFORE BREAKFAST.  Patient taking differently: Take 72 mcg by mouth every other day.   Rodney Clamp, MD 10/14/2023 Morning Active Spouse/Significant Other  LORazepam  (ATIVAN ) 0.5 MG tablet [id number] No Take 0.5 mg by mouth 3 (three) times daily. [provider] 10/14/2023 Noon Active Spouse/Significant Other           Med Note Vivian Groom, MELISSA R   Mon Oct 31, 2022  4:21 PM)    melatonin ([facility]S MELATONIN) 3 MG TABS tablet [id number] No Take 1 tablet (3 mg total)  by mouth at bedtime. Matcha, Anupama, MD 10/13/2023 Bedtime Active Spouse/Significant Other  morphine  (MSIR) 15 MG tablet [id number] No Take 1 tablet (15 mg total) by mouth every 3 (three) hours as needed for  severe pain (pain score 7-10). G89.21- for chornic pain Lovorn, Megan, MD 10/14/2023 Evening Active Spouse/Significant Other  Multiple Vitamin (MULTIVITAMIN WITH MINERALS) TABS tablet [id number] No Take 1 tablet by mouth daily. Adrienne Horning, MD 10/14/2023 Morning Active Spouse/Significant Other  naloxone  (NARCAN ) nasal spray 4 mg/0.1 mL [id number] No To use if pt develops unconsciousness or confusion that family thinks is related to opioids. Lovorn, Megan, MD Taking Active Spouse/Significant Other           Med Note Drexel Gentles, [place]   YQM Oct 14, 2023  8:58 PM) Never used  OLANZapine  (ZYPREXA ) 5 MG tablet [id number] No Take 2.5-10 mg by mouth See admin instructions. 2.5 mgh in the morning, and midday, and 10 mg at bedtime [provider] 10/14/2023 Evening Active Spouse/Significant Other  oxybutynin  (DITROPAN ) 5 MG tablet [id number] No Take 5 mg by mouth See admin instructions. Take 5 mg by mouth three times a day and an additional 5 mg once daily as needed for urinary urgency [provider] 10/14/2023 Evening Active Spouse/Significant Other  pantoprazole  (PROTONIX ) 40 MG tablet [id number] No Take 1 tablet (40 mg total) by mouth daily. Arlee Bellows, NP 10/14/2023 Morning Active Spouse/Significant Other  PARoxetine  (PAXIL ) 40 MG tablet [id number] No TAKE 1 TABLET BY MOUTH EVERYDAY AT BEDTIME Lovorn, Megan, MD 10/14/2023 Morning Active Spouse/Significant Other  potassium chloride  SA (KLOR-CON  M) 20 MEQ tablet [id number] No TAKE 1 TABLET BY MOUTH EVERY DAY Rodney Clamp, MD 10/14/2023 Morning Active Spouse/Significant Other  Probiotic Product (PROBIOTIC COLON SUPPORT) CAPS [id number] No Take 1 capsule by mouth daily. [provider] 10/14/2023 Morning Active Spouse/Significant Other  promethazine  (PHENERGAN ) 12.5 MG tablet [id number] No TAKE 1 TABLET (12.5 MG TOTAL) BY MOUTH EVERY 6 (SIX) HOURS AS NEEDED FOR NAUSEA, VOMITING OR REFRACTORY NAUSEA / VOMITING. Lovorn, Megan, MD 10/14/2023 Noon  Active Spouse/Significant Other  senna-docusate (SENOKOT-S) 8.6-50 MG tablet [id number] No Take 2 tablets by mouth 3 (three) times daily. [provider] 10/14/2023 Evening Active Spouse/Significant Other  Spacer/Aero-Holding Chambers (AEROCHAMBER MV) inhaler [id number] No Use as instructed with MDI Desai, Nikita S, MD Taking Active Spouse/Significant Other  vancomycin  (VANCOCIN ) 125 MG capsule [id number] No Take 1 capsule (125 mg total) by mouth in the morning and at bedtime. Bubba Carbo, FNP 10/14/2023 Morning Active Spouse/Significant Other            Home Care and Equipment/Supplies: Were Home Health Services Ordered?: NA Any new equipment or medical supplies ordered?: NA  Functional Questionnaire: Do you need assistance with bathing/showering or dressing?: Yes Do you need assistance with meal preparation?: Yes Do you need assistance with eating?: No Do you have difficulty maintaining continence: Yes Do you need assistance with getting out of bed/getting out of a chair/moving?: Yes Do you have difficulty managing or taking your medications?: Yes  Follow up appointments reviewed: PCP Follow-up appointment confirmed?: Yes Date of PCP follow-up appointment?: 10/23/23 Follow-up Provider: [facility] Follow-up appointment confirmed?: NA Do you need transportation to your follow-up appointment?: No Do you understand care options if your condition(s) worsen?: Yes-patient verbalized understanding    SIGNATURE Darrall Ellison, LPN [facility] Nurse Health Advisor Direct Dial [phone]
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
## 2023-10-19 LAB — CULTURE, BLOOD (ROUTINE X 2)
Culture: NO GROWTH
Culture: NO GROWTH

## 2023-10-23 ENCOUNTER — Inpatient Hospital Stay: Payer: PRIVATE HEALTH INSURANCE | Admitting: Family Medicine

## 2023-10-26 ENCOUNTER — Encounter: Payer: PRIVATE HEALTH INSURANCE | Attending: Psychology | Admitting: Psychology

## 2023-10-26 DIAGNOSIS — Z993 Dependence on wheelchair: Secondary | ICD-10-CM | POA: Diagnosis present

## 2023-10-26 DIAGNOSIS — R413 Other amnesia: Secondary | ICD-10-CM | POA: Diagnosis present

## 2023-10-26 DIAGNOSIS — M8668 Other chronic osteomyelitis, other site: Secondary | ICD-10-CM | POA: Insufficient documentation

## 2023-10-26 DIAGNOSIS — G894 Chronic pain syndrome: Secondary | ICD-10-CM | POA: Diagnosis present

## 2023-10-26 DIAGNOSIS — F321 Major depressive disorder, single episode, moderate: Secondary | ICD-10-CM | POA: Diagnosis present

## 2023-10-26 DIAGNOSIS — M792 Neuralgia and neuritis, unspecified: Secondary | ICD-10-CM | POA: Diagnosis present

## 2023-11-02 ENCOUNTER — Other Ambulatory Visit: Payer: Self-pay

## 2023-11-02 ENCOUNTER — Ambulatory Visit (INDEPENDENT_AMBULATORY_CARE_PROVIDER_SITE_OTHER): Payer: Self-pay | Admitting: Family Medicine

## 2023-11-02 VITALS — BP 126/82 | HR 103

## 2023-11-02 DIAGNOSIS — Z9359 Other cystostomy status: Secondary | ICD-10-CM

## 2023-11-02 DIAGNOSIS — E876 Hypokalemia: Secondary | ICD-10-CM

## 2023-11-02 DIAGNOSIS — N390 Urinary tract infection, site not specified: Secondary | ICD-10-CM

## 2023-11-02 DIAGNOSIS — D649 Anemia, unspecified: Secondary | ICD-10-CM

## 2023-11-02 DIAGNOSIS — R5381 Other malaise: Secondary | ICD-10-CM

## 2023-11-02 DIAGNOSIS — I10 Essential (primary) hypertension: Secondary | ICD-10-CM

## 2023-11-02 DIAGNOSIS — F32A Depression, unspecified: Secondary | ICD-10-CM

## 2023-11-02 DIAGNOSIS — F419 Anxiety disorder, unspecified: Secondary | ICD-10-CM

## 2023-11-02 LAB — CBC WITH DIFFERENTIAL/PLATELET
Basophils Absolute: 0 10*3/uL (ref 0.0–0.1)
Basophils Relative: 0.4 % (ref 0.0–3.0)
Eosinophils Absolute: 0.3 10*3/uL (ref 0.0–0.7)
Eosinophils Relative: 6 % — ABNORMAL HIGH (ref 0.0–5.0)
HCT: 38.8 % — ABNORMAL LOW (ref 39.0–52.0)
Hemoglobin: 12.9 g/dL — ABNORMAL LOW (ref 13.0–17.0)
Lymphocytes Relative: 10.7 % — ABNORMAL LOW (ref 12.0–46.0)
Lymphs Abs: 0.6 10*3/uL — ABNORMAL LOW (ref 0.7–4.0)
MCHC: 33.3 g/dL (ref 30.0–36.0)
MCV: 91.7 fl (ref 78.0–100.0)
Monocytes Absolute: 0.3 10*3/uL (ref 0.1–1.0)
Monocytes Relative: 5.7 % (ref 3.0–12.0)
Neutro Abs: 4.5 10*3/uL (ref 1.4–7.7)
Neutrophils Relative %: 77.2 % — ABNORMAL HIGH (ref 43.0–77.0)
Platelets: 218 10*3/uL (ref 150.0–400.0)
RBC: 4.23 Mil/uL (ref 4.22–5.81)
RDW: 14 % (ref 11.5–15.5)
WBC: 5.8 10*3/uL (ref 4.0–10.5)

## 2023-11-02 LAB — COMPREHENSIVE METABOLIC PANEL WITH GFR
ALT: 48 U/L (ref 0–53)
AST: 32 U/L (ref 0–37)
Albumin: 4 g/dL (ref 3.5–5.2)
Alkaline Phosphatase: 131 U/L — ABNORMAL HIGH (ref 39–117)
BUN: 14 mg/dL (ref 6–23)
CO2: 31 meq/L (ref 19–32)
Calcium: 9.8 mg/dL (ref 8.4–10.5)
Chloride: 102 meq/L (ref 96–112)
Creatinine, Ser: 0.69 mg/dL (ref 0.40–1.50)
GFR: 103.02 mL/min (ref >60.00)
Glucose, Bld: 112 mg/dL — ABNORMAL HIGH (ref 70–99)
Potassium: 4 meq/L (ref 3.5–5.1)
Sodium: 137 meq/L (ref 135–145)
Total Bilirubin: 0.4 mg/dL (ref 0.2–1.2)
Total Protein: 7.6 g/dL (ref 6.0–8.3)

## 2023-11-02 LAB — TSH: TSH: 0.45 u[IU]/mL (ref 0.35–5.50)

## 2023-11-02 MED ORDER — MORPHINE SULFATE 15 MG PO TABS: 15.0000 mg | ORAL_TABLET | ORAL | 0 refills | Status: DC | PRN

## 2023-11-02 MED ORDER — FENTANYL 75 MCG/HR TD PT72: 1.0000 | MEDICATED_PATCH | TRANSDERMAL | 0 refills | Status: DC

## 2023-11-02 MED ORDER — CEPHALEXIN 500 MG PO CAPS: 500.0000 mg | ORAL_CAPSULE | Freq: Two times a day (BID) | ORAL | 0 refills | Status: AC

## 2023-11-02 NOTE — Telephone Encounter (Signed)
 Refill request. Patient missed last appt because he was in the hospital.

## 2023-11-02 NOTE — Assessment & Plan Note (Signed)
 Empirically treating with Keflex  while we await culture results as above.

## 2023-11-02 NOTE — Patient Instructions (Signed)
 It was very nice to see you today!  Please start the Keflex .  Will check blood work and urine sample today.  I will look into having established with a new psychiatrist.  Will see back in 6 months.  Come back sooner if needed.  Return in about 6 months (around 05/04/2024) for Follow Up.   Take care, Dr Daneil Dunker  PLEASE NOTE:  If you had any lab tests, please let us  know if you have not heard back within a few days. You may see your results on mychart before we have a chance to review them but we will give you a call once they are reviewed by us .   If we ordered any referrals today, please let us  know if you have not heard from their office within the next week.   If you had any urgent prescriptions sent in today, please check with the pharmacy within an hour of our visit to make sure the prescription was transmitted appropriately.   Please try these tips to maintain a healthy lifestyle:  Eat at least 3 REAL meals and 1-2 snacks per day.  Aim for no more than 5 hours between eating.  If you eat breakfast, please do so within one hour of getting up.   Each meal should contain half fruits/vegetables, one quarter protein, and one quarter carbs (no bigger than a computer mouse)  Cut down on sweet beverages. This includes juice, soda, and sweet tea.   Drink at least 1 glass of water  with each meal and aim for at least 8 glasses per day  Exercise at least 150 minutes every week.

## 2023-11-02 NOTE — Assessment & Plan Note (Signed)
 Blood pressure at goal on HCTZ 12.5 mg daily.  Rechecking labs today as above.

## 2023-11-02 NOTE — Assessment & Plan Note (Signed)
 Following with urology for this.  Advised him to switch out suprapubic catheter due to concern for recurrent UTI.  They will continue management per urology.

## 2023-11-02 NOTE — Assessment & Plan Note (Signed)
 Overall symptoms are stable on current regimen of paroxetine40 mg daily and olanzapine  15 to 20 mg daily.  His psychiatrist has retired and they are looking into establishing with a new psychiatrist that will accept Worker's Comp.  We will place referral today to see if we can assist with this.  Overall his mood is stable.

## 2023-11-02 NOTE — Progress Notes (Signed)
 Christopher [name] is a 58 y.o. male who presents today for an office visit.  Assessment/Plan:  New/Acute Problems: Malaise Patient is concerned about recurrent UTI.  He is at high risk for this due to his chronically indwelling suprapubic catheter and was recently admitted for UTI as well.  We will empirically start Keflex  today.  No other red flag signs or symptoms or signs of systemic illness.  We will check urine culture results though due to his chronic indwelling catheter this may not be the most reliable source.  They are aware of reasons to return to care and seek emergent care.  Advised him to discuss his recurrent UTI with urology at their next office visit.  Normocytic anemia Hemoglobin was 12.1 at the time of discharge.  Will check CBC with differential today.  Hypokalemia Potassium mildly low at discharge at 3.2.  We will recheck c-Met today.  Chronic Problems Addressed Today: Suprapubic catheter [facility]) Following with urology for this.  Advised him to switch out suprapubic catheter due to concern for recurrent UTI.  They will continue management per urology.  Anxiety and depression Overall symptoms are stable on current regimen of paroxetine40 mg daily and olanzapine  15 to 20 mg daily.  His psychiatrist has retired and they are looking into establishing with a new psychiatrist that will accept Worker's Comp.  We will place referral today to see if we can assist with this.  Overall his mood is stable.  UTI (urinary tract infection) Empirically treating with Keflex  while we await culture results as above.  Essential hypertension Blood pressure at goal on HCTZ 12.5 mg daily.  Rechecking labs today as above.     Subjective:  HPI:  See A/P for status of chronic conditions.  Patient is here for follow-up.  He was admitted to the hospital a few weeks ago with acute encephalopathy.  Presented to the ED on 10/14/2023 with confusion.  In the ED had workup including UA which was  concerning for UTI.  He was admitted and started on ceftriaxone .  Urine culture showed Cipro  resistant E. coli.  He was transitioned to oral Keflex .  Mental status improved.  He was discharged home on hospital day 3.  He did well for a few weeks however the last few days he has had worsening malaise and jitteriness.  He is concerned that the UTI may be coming back as he has had the symptoms in the past before developing full-blown UTI symptoms.  He has been consistent with the ciprofloxacin  per ID for his osteomyelitis however has not had any other antibiotics.  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  They have tried a few different medications to help with his OAB however none of them beneficial and they are looking into pursuing Botox injections at this point.  He has not had any reported fevers or chills.  ROS: Per HPI, otherwise a complete review of systems was negative.   PMH:  The following were reviewed and entered/updated in epic: Past Medical History:  Diagnosis Date   Acute on chronic respiratory failure with hypoxia (HCC) 06/2018   trach removed 11-16-2018, on vent from jan until may 2020 - uses albuterol  prn   Anxiety    Bacteremia due to Pseudomonas 06/2018   Chronic osteomyelitis (HCC)    Chronic pain syndrome    Clostridium difficile colitis 10/30/2019   tx with abx  Depression    DVT (deep venous thrombosis) (HCC) 2020   right brachial post PICC line   History of blood transfusion 06/2018   History of Clostridioides difficile colitis    History of kidney stones    Hypertension    norvasc  d/c by pcp on 11/05/19   Multiple traumatic injuries    Penile pain 11/18/2019   Pneumonia 11/2009   2020 x 2   Walker as ambulation aid    Wheelchair bound    electric   Wound discharge    left hip wound with bloody/clear drainage change dressing q day surgilube with  gauze, between legs wound using calcium  algenate pad bid   Patient Active Problem List   Diagnosis Date Noted   Acute encephalopathy 10/14/2023   UTI (urinary tract infection) 10/14/2023   Dyslipidemia 05/05/2023   Metabolic syndrome 05/04/2023   At risk for Clostridium difficile infection 04/10/2023   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   At risk for complication of anesthesia 10/29/2022   Therapeutic drug monitoring 10/27/2022   Bladder spasms 10/03/2022   Leg edema 09/30/2022   Bilateral inguinal hernia without obstruction or gangrene 07/23/2022   Encounter for care related to feeding tube 07/23/2022   Irritant contact dermatitis associated with fecal stoma 07/23/2022   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   Memory deficits 01/12/2022   Hypnagogic jerks 09/22/2021   Neuromuscular weakness (HCC) 06/24/2021   Depression, major, single episode, moderate (HCC) 06/24/2021   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   Chronic osteomyelitis of hip (HCC) 11/22/2020   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/22/2020   Dysphagia 07/02/2020   Therapeutic opioid induced constipation 04/11/2020   Status post peripherally inserted central catheter (PICC) central line placement 03/17/2020   Skin-picking disorder 03/12/2020   Recurrent pain of right knee 11/08/2019   Essential hypertension 11/05/2019   Myofascial pain dysfunction syndrome 08/05/2019   Complicated urinary tract infection 05/30/2019   Wound infection, posttraumatic 02/28/2019   Hydronephrosis concurrent with and due to calculi of kidney and ureter 02/28/2019   GERD (gastroesophageal reflux disease) 02/28/2019   Suprapubic catheter (HCC) 02/28/2019   Anxiety 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 01/29/2019   Chronic pain due to trauma 01/29/2019   Neuropathic pain 01/29/2019   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   Insomnia 01/14/2019   Current use of proton pump inhibitor 01/14/2019   Wound of buttock 12/28/2018    Anxiety and depression 12/17/2018   Debility 11/23/2018   Chronic pain syndrome    Multiple traumatic injuries    Pressure injury of skin 08/13/2018   Urethral injury 07/13/2018   Past Surgical History:  Procedure Laterality Date   APPLICATION OF A-CELL OF BACK N/A 08/06/2018   Procedure: Application Of A-Cell Of Back;  Surgeon: Thornell Flirt, DO;  Location: [facility] OR;  Service: Plastics;  Laterality: N/A;   APPLICATION OF A-CELL OF EXTREMITY Left 08/06/2018   Procedure: Application Of A-Cell Of Extremity;  Surgeon: Thornell Flirt, DO;  Location: [facility] OR;  Service: Plastics;  Laterality: Left;   APPLICATION OF A-CELL OF EXTREMITY Left 09/18/2019   Procedure: APPLICATION OF A-CELL OF EXTREMITY;  Surgeon: Thornell Flirt, DO;  Location: [facility];  Service: Plastics;  Laterality: Left;   APPLICATION OF WOUND VAC  07/12/2018   Procedure: Application Of Wound Vac to the Left Thigh and Scrotum.;  Surgeon: Laneta Pintos, MD;  Location: [facility] OR;  Service: Orthopedics;;   APPLICATION OF WOUND VAC  07/10/2018   Procedure: Application Of Wound Vac;  Surgeon: Adalberto Acton, MD;  Location[facility];  Service: General;;   BIOPSY  11/02/2022   Procedure: BIOPSY;  Surgeon: Normie Becton., MD;  Location: [facility];  Service: Gastroenterology;;   COLON SURGERY  2020   colostomy   COLOSTOMY N/A 07/23/2018   Procedure: COLOSTOMY;  Surgeon: Dorena Gander, MD;  Location: [facility] OR;  Service: General;  Laterality: N/A;   CYSTOSCOPY W/ URETERAL STENT PLACEMENT N/A 07/15/2018   Procedure: RETROGRADE URETHROGRAM;  Surgeon: Trent Frizzle, MD;  Location: [facility] OR;  Service: Urology;  Laterality: N/A;   CYSTOSCOPY WITH LITHOLAPAXY N/A 05/06/2019   Procedure: CYSTOSCOPY BASKET BLADDER STONE EXTRACTION;  Surgeon: Marco Severs, MD;  Location: [facility];  Service: Urology;  Laterality: N/A;  30 MINS   CYSTOSTOMY N/A 05/06/2019   Procedure: REPLACEMENT OF SUPRAPUBIC CATHETER;   Surgeon: Marco Severs, MD;  Location: [facility];  Service: Urology;  Laterality: N/A;   DEBRIDEMENT AND CLOSURE WOUND Left 03/04/2019   Procedure: Excision of hip wound with placement of Acell;  Surgeon: Thornell Flirt, DO;  [facility];  Service: Plastics;  Laterality: Left;   ESOPHAGOGASTRODUODENOSCOPY N/A 08/14/2018   Procedure: ESOPHAGOGASTRODUODENOSCOPY (EGD);  Surgeon: Dorena Gander, MD;  Location: [facility] ENDOSCOPY;  Service: General;  Laterality: N/A;  bedside   ESOPHAGOGASTRODUODENOSCOPY (EGD) WITH PROPOFOL  N/A 11/02/2022   Procedure: ESOPHAGOGASTRODUODENOSCOPY (EGD) WITH PROPOFOL ;  Surgeon: Normie Becton., MD;  Location: [facility] ENDOSCOPY;  Service: Gastroenterology;  Laterality: N/A;   FACIAL RECONSTRUCTION SURGERY     X 2--once as a teenager and second time in his 23's   HARDWARE REMOVAL Left 03/04/2019   Procedure: Left Hip Hardware Removal;  Surgeon: Laneta Pintos, MD;  [facility]: [facility];  Service: Orthopedics;  Laterality: Left;   HEMOSTASIS CLIP PLACEMENT  11/02/2022   Procedure: HEMOSTASIS CLIP PLACEMENT;  Surgeon: Normie Becton., MD;  Location: [facility];  Service: Gastroenterology;;   HIP PINNING,CANNULATED Left 07/12/2018   Procedure: CANNULATED HIP PINNING;  Surgeon: Laneta Pintos, MD;  [facility];  Service: Orthopedics;  Laterality: Left;   HIP SURGERY     HOLMIUM LASER APPLICATION Right 07/15/2019   Procedure: HOLMIUM LASER APPLICATION;  Surgeon: Marco Severs, MD;  Location: [facility];  Service: Urology;  Laterality: Right;   HOT HEMOSTASIS N/A 11/02/2022   Procedure: HOT HEMOSTASIS (ARGON PLASMA COAGULATION/BICAP);  Surgeon: Normie Becton., MD;  Location: [facility];  Service: Gastroenterology;  Laterality: N/A;   I & D EXTREMITY Left 07/25/2018   Procedure: Debridement of buttock, scrotum and left leg, placement of acell and vac;  Surgeon: Thornell Flirt, DO;  [facility];  Service:  Plastics;  Laterality: Left;   I & D EXTREMITY N/A 08/06/2018   Procedure: Debridement of buttock, scrotum and left leg;  Surgeon: Thornell Flirt, DO;  [facility];  Service: Plastics;  Laterality: N/A;   I & D EXTREMITY N/A 08/13/2018   Procedure: Debridement of buttock, scrotum and left leg, placement of acell and vac;  Surgeon: Thornell Flirt, DO;  [facility]: [facility];  Service: Plastics;  Laterality: N/A;  90 min, please   INCISION AND DRAINAGE HIP Left 09/18/2019   Procedure: IRRIGATION AND DEBRIDEMENT HIP/ PELVIS WITH WOUND VAC PLACEMENT;  Surgeon: Laneta Pintos, MD;  [facility]: [facility];  Service: Orthopedics;  Laterality: Left;   INCISION AND DRAINAGE OF WOUND N/A 07/18/2018   Procedure: Debridement of left leg, buttocks and scrotal wound with  placement of acell and Flexiseal;  Surgeon: Thornell Flirt, DO;  Location: [facility];  Service: Plastics;  Laterality: N/A;   INCISION AND DRAINAGE OF WOUND Left 08/29/2018   Procedure: Debridement of buttock, scrotum and left leg, placement of acell and vac;  Surgeon: Thornell Flirt, DO;  Location: [facility];  Service: Plastics;  Laterality: Left;  75 min, please   INCISION AND DRAINAGE OF WOUND Bilateral 10/23/2018   Procedure: DEBRIDEMENT OF BUTTOCK,SCROTUM, AND LEG WOUNDS WITH PLACEMENT OF ACELL- BILATERAL 90 MIN;  Surgeon: Thornell Flirt, DO;  Location: [facility];  Service: Plastics;  Laterality: Bilateral;   IR ANGIOGRAM PELVIS SELECTIVE OR SUPRASELECTIVE  07/10/2018   IR ANGIOGRAM PELVIS SELECTIVE OR SUPRASELECTIVE  07/10/2018   IR ANGIOGRAM SELECTIVE EACH ADDITIONAL VESSEL  07/10/2018   IR EMBO ART  VEN HEMORR LYMPH EXTRAV  INC GUIDE ROADMAPPING  07/10/2018   IR GASTROSTOMY TUBE MOD SED  01/21/2021   IR NEPHROSTOMY PLACEMENT LEFT  04/05/2019   IR NEPHROSTOMY PLACEMENT RIGHT  05/31/2019   IR US  GUIDE BX ASP/DRAIN  07/10/2018   IR US  GUIDE VASC ACCESS RIGHT  07/10/2018   IR VENO/EXT/UNI LEFT  07/10/2018   IRRIGATION AND DEBRIDEMENT OF WOUND  WITH SPLIT THICKNESS SKIN GRAFT Left 09/19/2018   Procedure: Debridement of gluteal wound with placement of acell to left leg.;  Surgeon: Thornell Flirt, DO;  [facility]: [facility];  Service: Plastics;  Laterality: Left;  2.5 hours, please   LAPAROTOMY N/A 07/12/2018   Procedure: EXPLORATORY LAPAROTOMY;  Surgeon: Dorena Gander, MD;  Location: [facility] OR;  Service: General;  Laterality: N/A;   LAPAROTOMY N/A 07/15/2018   Procedure: WOUND EXPLORATION; CLOSURE OF ABDOMEN;  Surgeon: Dorena Gander, MD;  Location: [facility] OR;  Service: General;  Laterality: N/A;   LAPAROTOMY  07/10/2018   Procedure: Exploratory Laparotomy;  Surgeon: Adalberto Acton, MD;  Location: [facility] OR;  Service: General;;   MASS EXCISION Left 09/18/2019   Procedure: EXCISION UPPER LEFT INNER THIGH WOUND;  Surgeon: Thornell Flirt, DO;  [facility];  Service: Plastics;  Laterality: Left;   NEPHROLITHOTOMY Right 07/15/2019   Procedure: NEPHROLITHOTOMY PERCUTANEOUS;  Surgeon: Marco Severs, MD;  Location: [facility];  Service: Urology;  Laterality: Right;  90 MINS   PEG PLACEMENT N/A 08/14/2018   Procedure: PERCUTANEOUS ENDOSCOPIC GASTROSTOMY (PEG) PLACEMENT;  Surgeon: Dorena Gander, MD;  Location: [facility];  Service: General;  Laterality: N/A;   PERCUTANEOUS TRACHEOSTOMY N/A 08/02/2018   Procedure: PERCUTANEOUS TRACHEOSTOMY;  Surgeon: Dorena Gander, MD;  Location: [facility] OR;  Service: General;  Laterality: N/A;   RADIOLOGY WITH ANESTHESIA N/A 07/10/2018   Procedure: IR WITH ANESTHESIA;  Surgeon: Robbi Childs, MD;  Location: [facility] OR;  Service: Radiology;  Laterality: N/A;   RADIOLOGY WITH ANESTHESIA Right 07/10/2018   Procedure: Ir With Anesthesia;  Surgeon: Robbi Childs, MD;  Location: [facility] OR;  Service: Radiology;  Laterality: Right;   SAVORY DILATION N/A 11/02/2022   Procedure: SAVORY DILATION;  Surgeon: Brice Campi Albino Alu., MD;  Location: [facility];  Service: Gastroenterology;  Laterality: N/A;   SCROTAL  EXPLORATION N/A 07/15/2018   Procedure: SCROTUM DEBRIDEMENT;  Surgeon: Trent Frizzle, MD;  Location: [facility] OR;  Service: Urology;  Laterality: N/A;   SHOULDER SURGERY     SKIN SPLIT GRAFT Right 09/19/2018   Procedure: Skin Graft Split Thickness;  Surgeon: Thornell Flirt, DO;  [facility];  Service: Plastics;  Laterality: Right;   SKIN SPLIT GRAFT N/A 10/03/2018  Procedure: Split thickness skin graft to gluteal area with acell placement;  Surgeon: Thornell Flirt, DO;  Location: [facility];  Service: Plastics;  Laterality: N/A;  3 hours, please   VACUUM ASSISTED CLOSURE CHANGE N/A 07/12/2018   Procedure: ABDOMINAL VACUUM ASSISTED CLOSURE CHANGE and abdominal washout;  Surgeon: Dorena Gander, MD;  Location: [facility] OR;  Service: General;  Laterality: N/A;   WOUND DEBRIDEMENT Left 07/23/2018   Procedure: DEBRIDEMENT LEFT BUTTOCK  WOUND;  Surgeon: Dorena Gander, MD;  Location: [facility] OR;  Service: General;  Laterality: Left;   WOUND EXPLORATION Left 07/10/2018   Procedure: WOUND EXPLORATION LEFT GROIN;  Surgeon: Adalberto Acton, MD;  Location[facility];  Service: General;  Laterality: Left;    Family History  Problem Relation Age of Onset   Breast cancer Mother        with mets to the bones    Medications- reviewed and updated Current Outpatient Medications  Medication Sig Dispense Refill   albuterol  (VENTOLIN  HFA) 108 (90 Base) MCG/ACT inhaler INHALE 2 PUFFS BY MOUTH EVERY 6 HOURS AS NEEDED FOR WHEEZE OR SHORTNESS OF BREATH 18 each 1   BREZTRI  AEROSPHERE 160-9-4.8 MCG/ACT AERO INHALE 2 PUFFS INTO THE LUNGS IN THE MORNING AND AT BEDTIME. 10.7 each 5   cephALEXin  (KEFLEX ) 500 MG capsule Take 1 capsule (500 mg total) by mouth 2 (two) times daily for 10 days. Take for 7 days 20 capsule 0   ciprofloxacin  (CIPRO ) 500 MG tablet Take 1 tablet (500 mg total) by mouth 2 (two) times daily. 60 tablet 11   famotidine  (PEPCID ) 40 MG tablet TAKE 1 TABLET BY MOUTH TWICE A DAY (Patient taking differently:  Take 40 mg by mouth daily.) 180 tablet 1   fentaNYL  (DURAGESIC ) 75 MCG/HR Place 1 patch onto the skin every other day. ICD 10 code- G89.21- For chronic pain 15 patch 0   furosemide  (LASIX ) 40 MG tablet Take 1 tablet (40 mg total) by mouth daily. 90 tablet 1   gabapentin  (NEURONTIN ) 300 MG capsule Take 1 capsule (300 mg total) by mouth 3 (three) times daily. 270 capsule 3   GEMTESA 75 MG TABS Take 75 mg by mouth daily.     hydrochlorothiazide  (HYDRODIURIL ) 12.5 MG tablet TAKE 1 TABLET BY MOUTH EVERY DAY 90 tablet 3   imipramine  (TOFRANIL ) 25 MG tablet TAKE 1 TABLET (25 MG TOTAL) BY MOUTH IN THE MORNING, AT NOON, AND AT BEDTIME. FOR BLADDER SPASMS 270 tablet 1   LINZESS  72 MCG capsule TAKE 2 CAPSULES (145 MCG TOTAL) BY MOUTH DAILY BEFORE BREAKFAST. (Patient taking differently: Take 72 mcg by mouth every other day.) 30 capsule 5   LORazepam  (ATIVAN ) 0.5 MG tablet Take 0.5 mg by mouth 3 (three) times daily.     melatonin ([employer] MELATONIN) 3 MG TABS tablet Take 1 tablet (3 mg total) by mouth at bedtime. 10 tablet 0   morphine  (MSIR) 15 MG tablet Take 1 tablet (15 mg total) by mouth every 3 (three) hours as needed for severe pain (pain score 7-10). G89.21- for chornic pain 200 tablet 0   Multiple Vitamin (MULTIVITAMIN WITH MINERALS) TABS tablet Take 1 tablet by mouth daily.     naloxone  (NARCAN ) nasal spray 4 mg/0.1 mL To use if pt develops unconsciousness or confusion that family thinks is related to opioids. 1 each 3   OLANZapine  (ZYPREXA ) 5 MG tablet Take 2.5-10 mg by mouth See admin instructions. 2.5 mgh in the morning, and midday, and 10 mg at bedtime  oxybutynin  (DITROPAN ) 5 MG tablet Take 5 mg by mouth See admin instructions. Take 5 mg by mouth three times a day and an additional 5 mg once daily as needed for urinary urgency     pantoprazole  (PROTONIX ) 40 MG tablet Take 1 tablet (40 mg total) by mouth daily. 90 tablet 2   PARoxetine  (PAXIL ) 40 MG tablet TAKE 1 TABLET BY MOUTH EVERYDAY AT BEDTIME 90  tablet 2   potassium chloride  SA (KLOR-CON  M) 20 MEQ tablet TAKE 1 TABLET BY MOUTH EVERY DAY 90 tablet 1   Probiotic Product (PROBIOTIC COLON SUPPORT) CAPS Take 1 capsule by mouth daily.     promethazine  (PHENERGAN ) 12.5 MG tablet TAKE 1 TABLET (12.5 MG TOTAL) BY MOUTH EVERY 6 (SIX) HOURS AS NEEDED FOR NAUSEA, VOMITING OR REFRACTORY NAUSEA / VOMITING. 90 tablet 5   senna-docusate (SENOKOT-S) 8.6-50 MG tablet Take 2 tablets by mouth 3 (three) times daily.     Spacer/Aero-Holding Chambers (AEROCHAMBER MV) inhaler Use as instructed with MDI 1 each 0   vancomycin  (VANCOCIN ) 125 MG capsule Take 1 capsule (125 mg total) by mouth in the morning and at bedtime. 60 capsule 11   No current facility-administered medications for this visit.    Allergies-reviewed and updated Allergies  Allergen Reactions   Methadone  Other (See Comments)    Hallucinations/confusion    Social History   Socioeconomic History   Marital status: Married    Spouse name: Not on file   Number of children: Not on file   Years of education: Not on file   Highest education level: Not on file  Occupational History   Occupation: Disable  Tobacco Use   Smoking status: Former    Current packs/day: 0.00    Average packs/day: 1 pack/day for 20.0 years (20.0 ttl pk-yrs)    Types: Cigarettes    Start date: 07/10/1998    Quit date: 07/10/2018    Years since quitting: 5.3   Smokeless tobacco: Never  Vaping Use   Vaping status: Never Used  Substance and Sexual Activity   Alcohol use: Never   Drug use: Yes    Types: Oxycodone , Fentanyl     Comment: Fentanyl  patch/oxycodone  since 06/2018   Sexual activity: Yes  Other Topics Concern   Not on file  Social History Narrative   ** Merged History Encounter **       Social Drivers of Health   Financial Resource Strain: Low Risk  (03/01/2019)   Overall Financial Resource Strain (CARDIA)    Difficulty of Paying Living Expenses: Not very hard  Food Insecurity: No Food Insecurity  (10/15/2023)   Hunger Vital Sign    Worried About Running Out of Food in the Last Year: Never true    Ran Out of Food in the Last Year: Never true  Transportation Needs: No Transportation Needs (10/15/2023)   PRAPARE - Administrator, Civil Service (Medical): No    Lack of Transportation (Non-Medical): No  Physical Activity: Inactive (03/01/2019)   Exercise Vital Sign    Days of Exercise per Week: 0 days    Minutes of Exercise per Session: 0 min  Stress: Stress Concern Present (03/01/2019)   [employer] of Occupational Health - Occupational Stress Questionnaire    Feeling of Stress : Rather much  Social Connections: Not on file         Objective:  Physical Exam: BP 126/82   Pulse (!) 103   SpO2 95%   Gen: No acute distress, resting comfortably in  wheelchair CV: Regular rate and rhythm with no murmurs appreciated Pulm: Normal work of breathing, clear to auscultation bilaterally with no crackles, wheezes, or rhonchi Neuro: Grossly normal Psych: Normal affect and thought content  Time Spent:  45 minutes of total time was spent on the date of the encounter performing the following actions: chart review prior to seeing the patient, obtaining history, performing a medically necessary exam, counseling on the treatment plan, placing orders, and documenting in our EHR.        Jinny Mounts. Daneil Dunker, MD 11/02/2023 11:15 AM

## 2023-11-03 ENCOUNTER — Ambulatory Visit: Payer: Self-pay | Admitting: Family Medicine

## 2023-11-03 NOTE — Progress Notes (Signed)
 Potassium is back to normal.  His alkaline phosphatase is a little elevated.  This is probably nothing to worry about but we can recheck this again in a few weeks.  His blood counts are improving.  His thyroid level is normal.  Do not need to make any changes to his treatment plan at this time.  We can recheck again at his next office visit.

## 2023-11-05 LAB — URINE CULTURE
MICRO NUMBER:: 16489756
SPECIMEN QUALITY:: ADEQUATE

## 2023-11-07 NOTE — Progress Notes (Signed)
 Urine culture shows UTI.  The Keflex  we have him on should treat this.  He should let us  know if symptoms are not improving.

## 2023-11-09 ENCOUNTER — Encounter: Payer: Self-pay | Attending: Physical Medicine and Rehabilitation | Admitting: Psychology

## 2023-11-09 DIAGNOSIS — M8668 Other chronic osteomyelitis, other site: Secondary | ICD-10-CM | POA: Diagnosis not present

## 2023-11-09 DIAGNOSIS — Z993 Dependence on wheelchair: Secondary | ICD-10-CM | POA: Diagnosis present

## 2023-11-09 DIAGNOSIS — M792 Neuralgia and neuritis, unspecified: Secondary | ICD-10-CM

## 2023-11-09 DIAGNOSIS — F321 Major depressive disorder, single episode, moderate: Secondary | ICD-10-CM

## 2023-11-09 DIAGNOSIS — G894 Chronic pain syndrome: Secondary | ICD-10-CM | POA: Diagnosis present

## 2023-11-09 DIAGNOSIS — R413 Other amnesia: Secondary | ICD-10-CM

## 2023-11-15 ENCOUNTER — Encounter: Payer: Self-pay | Admitting: Psychology
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
## 2023-11-16 ENCOUNTER — Encounter: Payer: Self-pay | Admitting: Psychology
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
## 2023-11-17 NOTE — Progress Notes (Signed)
 Subjective:    Patient ID: Christopher [name], male    DOB: [birth year], 58 y.o.   MRN: [id number]  HPI  Pt is a 58 yr old male with hx of Multitrauma- causing L femoral neck fx, degloving of L hip to going/scrotum, bladder neck trauma- got SPC, developed compartment syndrome -s/p surgery for that; also diverting colostomy, skin grafts, and s/p trach and PEG- PEG is out. 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 S/P screw removal and on IV ABX for L hip osteomyelitis. 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  Has R severe ulnar neuropathy as well. O2 3L full time.   Here for f/u on multitrauma and chronic L hip osteomyelitis with associated chronic pain.      Was in hospital in May- real bad UTI- pulled catheter out- even with balloon up before EMS got to his house- so confused.    Went to Urology- for spasms of bladder- since having bad spasms of bladder- via [facility]-   Just painful spasms, no urine around foley- but will do q6-9 months? 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   Dr Wilhemina Harden- moved to different practice- in process of finding him a psychiatrist. His Counselor left counselor- so needs new one as well.    Giving him Cranberry pills.  Not really making a difference so far  The company to see them several weeks ago- for rear joystick- but sounds like hasn't gotten yet.    Pain about the same-    Still confused- loss memory- is stable amount- not worse except when he  gets sick.    Saw Dr Cruz Door- confusion- he thinks due to/because pt not fully awake.   Still having hynogogic jerks.  Hearing the music, voices, footsteps-  Still- hears at different times.  Dr R says it's his subconscious not firing correctly- and 1 part of brain awake and 1 asleep-  Not sleeping well at night- even during the day.   He'll try to go to sleep- so restless when asleep.  Mouth moving, like talking in sleep- jerking a lot-  Like just falling asleep and jerked awake.     Was given by PCP a second round of PO ABX after got home from hospital- ~ 7-10 days later- since starting to act like would be getting another UTI- and that seemed to work.    Pain Inventory Average Pain 8 Pain Right Now 8 My pain is intermittent, sharp, burning, dull, stabbing, tingling, aching, and Numbing  In the last 24 hours, has pain interfered with the following? General activity 8 Relation with others 8 Enjoyment of life 8 What TIME of day is your pain at its worst? morning , daytime, evening, night, and varies Sleep (in general) Fair  Pain is worse with: walking, bending, sitting, inactivity, standing, unsure, and some activites Pain improves with: rest and medication Relief from Meds: 8  Family History  Problem Relation Age of Onset  Breast cancer Mother        with mets to the bones   Social History   Socioeconomic History   Marital status: Married    Spouse name: Not on file   Number of children: Not on file   Years of education: Not on file   Highest education level: Not on file  Occupational History   Occupation: Disable  Tobacco Use   Smoking status: Former    Current packs/day: 0.00    Average packs/day: 1 pack/day for 20.0 years (20.0 ttl pk-yrs)    Types: Cigarettes    Start date: 07/10/1998    Quit date: 07/10/2018    Years since quitting: 5.3   Smokeless tobacco: Never  Vaping Use   Vaping status: Never Used  Substance and Sexual Activity   Alcohol use:  Never   Drug use: Yes    Types: Oxycodone , Fentanyl     Comment: Fentanyl  patch/oxycodone  since 06/2018   Sexual activity: Yes  Other Topics Concern   Not on file  Social History Narrative   ** Merged History Encounter **       Social Drivers of Health   Financial Resource Strain: Low Risk  (03/01/2019)   Overall Financial Resource Strain (CARDIA)    Difficulty of Paying Living Expenses: Not very hard  Food Insecurity: No Food Insecurity (10/15/2023)   Hunger Vital Sign    Worried About Running Out of Food in the Last Year: Never true    Ran Out of Food in the Last Year: Never true  Transportation Needs: No Transportation Needs (10/15/2023)   PRAPARE - Administrator, Civil Service (Medical): No    Lack of Transportation (Non-Medical): No  Physical Activity: Inactive (03/01/2019)   Exercise Vital Sign    Days of Exercise per Week: 0 days    Minutes of Exercise per Session: 0 min  Stress: Stress Concern Present (03/01/2019)   [employer] of Occupational Health - Occupational Stress Questionnaire    Feeling of Stress : Rather much  Social Connections: Not on file   Past Surgical History:  Procedure Laterality Date   APPLICATION OF A-CELL OF BACK N/A 08/06/2018   Procedure: Application Of A-Cell Of Back;  Surgeon: Thornell Flirt, DO;  [facility];  Service: Plastics;  Laterality: N/A;   APPLICATION OF A-CELL OF EXTREMITY Left 08/06/2018   Procedure: Application Of A-Cell Of Extremity;  Surgeon: Thornell Flirt, DO;  [facility];  Service: Plastics;  Laterality: Left;   APPLICATION OF A-CELL OF EXTREMITY Left 09/18/2019   Procedure: APPLICATION OF A-CELL OF EXTREMITY;  Surgeon: Thornell Flirt, DO;  [facility];  Service: Plastics;  Laterality: Left;   APPLICATION OF WOUND VAC  07/12/2018   Procedure: Application Of Wound Vac to the Left Thigh and Scrotum.;  Surgeon: Laneta Pintos, MD;  [facility];  Service: Orthopedics;;   APPLICATION OF  WOUND VAC  07/10/2018   Procedure: Application Of Wound Vac;  Surgeon: Adalberto Acton, MD;  Location: [facility] OR;  Service: General;;   BIOPSY  11/02/2022   Procedure: BIOPSY;  Surgeon: Normie Becton., MD;  Location: [facility];  Service: Gastroenterology;;   COLON SURGERY  2020   colostomy   COLOSTOMY N/A 07/23/2018   Procedure: COLOSTOMY;  Surgeon: Dorena Gander, MD;  Location[facility] OR;  Service: General;  Laterality: N/A;   CYSTOSCOPY W/ URETERAL STENT PLACEMENT N/A 07/15/2018   Procedure: RETROGRADE URETHROGRAM;  Surgeon: Trent Frizzle, MD;  [facility];  Service: Urology;  Laterality: N/A;   CYSTOSCOPY WITH LITHOLAPAXY N/A 05/06/2019   Procedure: CYSTOSCOPY BASKET BLADDER STONE EXTRACTION;  Surgeon: Marco Severs, MD;  Location: [facility];  Service: Urology;  Laterality: N/A;  30 MINS   CYSTOSTOMY N/A 05/06/2019   Procedure: REPLACEMENT OF SUPRAPUBIC CATHETER;  Surgeon: Marco Severs, MD;  Location: [facility];  Service: Urology;  Laterality: N/A;   DEBRIDEMENT AND CLOSURE WOUND Left 03/04/2019   Procedure: Excision of hip wound with placement of Acell;  Surgeon: Thornell Flirt, DO;  [facility];  Service: Plastics;  Laterality: Left;   ESOPHAGOGASTRODUODENOSCOPY N/A 08/14/2018   Procedure: ESOPHAGOGASTRODUODENOSCOPY (EGD);  Surgeon: Dorena Gander, MD;  [facility] ENDOSCOPY;  Service: General;  Laterality: N/A;  bedside   ESOPHAGOGASTRODUODENOSCOPY (EGD) WITH PROPOFOL  N/A 11/02/2022   Procedure: ESOPHAGOGASTRODUODENOSCOPY (EGD) WITH PROPOFOL ;  Surgeon: Normie Becton., MD;  Location: [facility];  Service: Gastroenterology;  Laterality: N/A;   FACIAL RECONSTRUCTION SURGERY     X 2--once as a teenager and second time in his 73's   HARDWARE REMOVAL Left 03/04/2019   Procedure: Left Hip Hardware Removal;  Surgeon: Laneta Pintos, MD;  [facility];  Service: Orthopedics;  Laterality: Left;   HEMOSTASIS CLIP  PLACEMENT  11/02/2022   Procedure: HEMOSTASIS CLIP PLACEMENT;  Surgeon: Normie Becton., MD;  Location: [facility];  Service: Gastroenterology;;   HIP PINNING,CANNULATED Left 07/12/2018   Procedure: CANNULATED HIP PINNING;  Surgeon: Laneta Pintos, MD;  [facility];  Service: Orthopedics;  Laterality: Left;   HIP SURGERY     HOLMIUM LASER APPLICATION Right 07/15/2019   Procedure: HOLMIUM LASER APPLICATION;  Surgeon: Marco Severs, MD;  Location: [facility];  Service: Urology;  Laterality: Right;   HOT HEMOSTASIS N/A 11/02/2022   Procedure: HOT HEMOSTASIS (ARGON PLASMA COAGULATION/BICAP);  Surgeon: Normie Becton., MD;  Location: [facility];  Service: Gastroenterology;  Laterality: N/A;   I & D EXTREMITY Left 07/25/2018   Procedure: Debridement of buttock, scrotum and left leg, placement of acell and vac;  Surgeon: Thornell Flirt, DO;  [facility];  Service: Plastics;  Laterality: Left;   I & D EXTREMITY N/A 08/06/2018   Procedure: Debridement of buttock, scrotum and left leg;  Surgeon: Thornell Flirt, DO;  [facility];  Service: Plastics;  Laterality: N/A;   I & D EXTREMITY N/A 08/13/2018   Procedure: Debridement of buttock, scrotum and left leg, placement of acell and vac;  Surgeon: Thornell Flirt, DO;  [facility];  Service: Plastics;  Laterality: N/A;  90 min, please   INCISION AND DRAINAGE HIP Left 09/18/2019   Procedure: IRRIGATION AND DEBRIDEMENT HIP/ PELVIS WITH WOUND VAC PLACEMENT;  Surgeon: Laneta Pintos, MD;  [facility];  Service: Orthopedics;  Laterality: Left;   INCISION AND DRAINAGE OF WOUND N/A 07/18/2018   Procedure: Debridement of left leg, buttocks and scrotal wound with placement of acell and Flexiseal;  Surgeon: Thornell Flirt, DO;  [facility];  Service: Plastics;  Laterality: N/A;   INCISION AND DRAINAGE OF WOUND Left 08/29/2018   Procedure: Debridement of buttock, scrotum and left leg, placement  of acell and vac;  Surgeon: Thornell Flirt, DO;  [facility];  Service: Plastics;  Laterality: Left;  75 min, please   INCISION AND DRAINAGE OF WOUND Bilateral 10/23/2018   Procedure: DEBRIDEMENT OF BUTTOCK,SCROTUM, AND LEG WOUNDS WITH PLACEMENT OF ACELL-  BILATERAL 90 MIN;  Surgeon: Thornell Flirt, DO;  [facility];  Service: Plastics;  Laterality: Bilateral;   IR ANGIOGRAM PELVIS SELECTIVE OR SUPRASELECTIVE  07/10/2018   IR ANGIOGRAM PELVIS SELECTIVE OR SUPRASELECTIVE  07/10/2018   IR ANGIOGRAM SELECTIVE EACH ADDITIONAL VESSEL  07/10/2018   IR EMBO ART  VEN HEMORR LYMPH EXTRAV  INC GUIDE ROADMAPPING  07/10/2018   IR GASTROSTOMY TUBE MOD SED  01/21/2021   IR NEPHROSTOMY PLACEMENT LEFT  04/05/2019   IR NEPHROSTOMY PLACEMENT RIGHT  05/31/2019   IR US  GUIDE BX ASP/DRAIN  07/10/2018   IR US  GUIDE VASC ACCESS RIGHT  07/10/2018   IR VENO/EXT/UNI LEFT  07/10/2018   IRRIGATION AND DEBRIDEMENT OF WOUND WITH SPLIT THICKNESS SKIN GRAFT Left 09/19/2018   Procedure: Debridement of gluteal wound with placement of acell to left leg.;  Surgeon: Thornell Flirt, DO;  [facility];  Service: Plastics;  Laterality: Left;  2.5 hours, please   LAPAROTOMY N/A 07/12/2018   Procedure: EXPLORATORY LAPAROTOMY;  Surgeon: Dorena Gander, MD;  [facility] OR;  Service: General;  Laterality: N/A;   LAPAROTOMY N/A 07/15/2018   Procedure: WOUND EXPLORATION; CLOSURE OF ABDOMEN;  Surgeon: Dorena Gander, MD;  Location: [facility] OR;  Service: General;  Laterality: N/A;   LAPAROTOMY  07/10/2018   Procedure: Exploratory Laparotomy;  Surgeon: Adalberto Acton, MD;  Location: [facility] OR;  Service: General;;   MASS EXCISION Left 09/18/2019   Procedure: EXCISION UPPER LEFT INNER THIGH WOUND;  Surgeon: Thornell Flirt, DO;  [facility];  Service: Plastics;  Laterality: Left;   NEPHROLITHOTOMY Right 07/15/2019   Procedure: NEPHROLITHOTOMY PERCUTANEOUS;  Surgeon: Marco Severs, MD;  Location: [facility];  Service: Urology;  Laterality: Right;  90 MINS   PEG PLACEMENT N/A 08/14/2018   Procedure: PERCUTANEOUS ENDOSCOPIC GASTROSTOMY (PEG) PLACEMENT;  Surgeon: Dorena Gander, MD;  Location: [facility] ENDOSCOPY;  Service: General;  Laterality: N/A;   PERCUTANEOUS TRACHEOSTOMY N/A 08/02/2018   Procedure: PERCUTANEOUS TRACHEOSTOMY;  Surgeon: Dorena Gander, MD;  Location: [facility] OR;  Service: General;  Laterality: N/A;   RADIOLOGY WITH ANESTHESIA N/A 07/10/2018   Procedure: IR WITH ANESTHESIA;  Surgeon: Robbi Childs, MD;  Location: [facility] OR;  Service: Radiology;  Laterality: N/A;   RADIOLOGY WITH ANESTHESIA Right 07/10/2018   Procedure: Ir With Anesthesia;  Surgeon: Robbi Childs, MD;  [facility] OR;  Service: Radiology;  Laterality: Right;   SAVORY DILATION N/A 11/02/2022   Procedure: SAVORY DILATION;  Surgeon: Brice Campi Albino Alu., MD;  Location: [facility];  Service: Gastroenterology;  Laterality: N/A;   SCROTAL EXPLORATION N/A 07/15/2018   Procedure: SCROTUM DEBRIDEMENT;  Surgeon: Trent Frizzle, MD;  Location: [facility] OR;  Service: Urology;  Laterality: N/A;   SHOULDER SURGERY     SKIN SPLIT GRAFT Right 09/19/2018   Procedure: Skin Graft Split Thickness;  Surgeon: Thornell Flirt, DO;  [facility];  Service: Plastics;  Laterality: Right;   SKIN SPLIT GRAFT N/A 10/03/2018   Procedure: Split thickness skin graft to gluteal area with acell placement;  Surgeon: Thornell Flirt, DO;  [facility];  Service: Plastics;  Laterality: N/A;  3 hours, please   VACUUM ASSISTED CLOSURE CHANGE N/A 07/12/2018   Procedure: ABDOMINAL VACUUM ASSISTED CLOSURE CHANGE and abdominal washout;  Surgeon: Dorena Gander, MD;  Location: [facility] OR;  Service: General;  Laterality: N/A;   WOUND DEBRIDEMENT Left 07/23/2018   Procedure: DEBRIDEMENT LEFT BUTTOCK  WOUND;  Surgeon: Dorena Gander, MD;  Location: [facility] OR;  Service: General;  Laterality: Left;   WOUND EXPLORATION Left 07/10/2018   Procedure: WOUND EXPLORATION LEFT  GROIN;  Surgeon: Adalberto Acton, MD;  Location: [facility] OR;  Service: General;  Laterality: Left;   Past Surgical History:  Procedure Laterality Date   APPLICATION OF A-CELL OF BACK N/A 08/06/2018   Procedure: Application Of A-Cell Of Back;  Surgeon: Thornell Flirt, DO;  [facility];  Service: Plastics;  Laterality: N/A;   APPLICATION OF A-CELL OF EXTREMITY Left 08/06/2018   Procedure: Application Of A-Cell Of Extremity;  Surgeon: Thornell Flirt, DO;  [facility];  Service: Plastics;  Laterality: Left;   APPLICATION OF A-CELL OF EXTREMITY Left 09/18/2019   Procedure: APPLICATION OF A-CELL OF EXTREMITY;  Surgeon: Thornell Flirt, DO;  [facility];  Service: Plastics;  Laterality: Left;   APPLICATION OF WOUND VAC  07/12/2018   Procedure: Application Of Wound Vac to the Left Thigh and Scrotum.;  Surgeon: Laneta Pintos, MD;  [facility];  Service: Orthopedics;;   APPLICATION OF WOUND VAC  07/10/2018   Procedure: Application Of Wound Vac;  Surgeon: Adalberto Acton, MD;  Location: [facility] OR;  Service: General;;   BIOPSY  11/02/2022   Procedure: BIOPSY;  Surgeon: Normie Becton., MD;  Location: [facility];  Service: Gastroenterology;;   COLON SURGERY  2020   colostomy   COLOSTOMY N/A 07/23/2018   Procedure: COLOSTOMY;  Surgeon: Dorena Gander, MD;  Location: [facility] OR;  Service: General;  Laterality: N/A;   CYSTOSCOPY W/ URETERAL STENT PLACEMENT N/A 07/15/2018   Procedure: RETROGRADE URETHROGRAM;  Surgeon: Trent Frizzle, MD;  Location: [facility] OR;  Service: Urology;  Laterality: N/A;   CYSTOSCOPY WITH LITHOLAPAXY N/A 05/06/2019   Procedure: CYSTOSCOPY BASKET BLADDER STONE EXTRACTION;  Surgeon: Marco Severs, MD;  Location: [facility];  Service: Urology;  Laterality: N/A;  30 MINS   CYSTOSTOMY N/A 05/06/2019   Procedure: REPLACEMENT OF SUPRAPUBIC CATHETER;  Surgeon: Marco Severs, MD;  Location: [facility];  Service: Urology;   Laterality: N/A;   DEBRIDEMENT AND CLOSURE WOUND Left 03/04/2019   Procedure: Excision of hip wound with placement of Acell;  Surgeon: Thornell Flirt, DO;  [facility];  Service: Plastics;  Laterality: Left;   ESOPHAGOGASTRODUODENOSCOPY N/A 08/14/2018   Procedure: ESOPHAGOGASTRODUODENOSCOPY (EGD);  Surgeon: Dorena Gander, MD;  Location: [facility];  Service: General;  Laterality: N/A;  bedside   ESOPHAGOGASTRODUODENOSCOPY (EGD) WITH PROPOFOL  N/A 11/02/2022   Procedure: ESOPHAGOGASTRODUODENOSCOPY (EGD) WITH PROPOFOL ;  Surgeon: Normie Becton., MD;  Location: [facility];  Service: Gastroenterology;  Laterality: N/A;   FACIAL RECONSTRUCTION SURGERY     X 2--once as a teenager and second time in his 75's   HARDWARE REMOVAL Left 03/04/2019   Procedure: Left Hip Hardware Removal;  Surgeon: Laneta Pintos, MD;  [facility];  Service: Orthopedics;  Laterality: Left;   HEMOSTASIS CLIP PLACEMENT  11/02/2022   Procedure: HEMOSTASIS CLIP PLACEMENT;  Surgeon: Normie Becton., MD;  Location: [facility];  Service: Gastroenterology;;   HIP PINNING,CANNULATED Left 07/12/2018   Procedure: CANNULATED HIP PINNING;  Surgeon: Laneta Pintos, MD;  [facility];  Service: Orthopedics;  Laterality: Left;   HIP SURGERY     HOLMIUM LASER APPLICATION Right 07/15/2019   Procedure: HOLMIUM LASER APPLICATION;  Surgeon: Marco Severs, MD;  Location: [facility];  Service: Urology;  Laterality: Right;   HOT HEMOSTASIS N/A 11/02/2022   Procedure: HOT HEMOSTASIS (  ARGON PLASMA COAGULATION/BICAP);  Surgeon: Normie Becton., MD;  Location: [facility];  Service: Gastroenterology;  Laterality: N/A;   I & D EXTREMITY Left 07/25/2018   Procedure: Debridement of buttock, scrotum and left leg, placement of acell and vac;  Surgeon: Thornell Flirt, DO;  [facility];  Service: Plastics;  Laterality: Left;   I & D EXTREMITY N/A 08/06/2018   Procedure: Debridement of  buttock, scrotum and left leg;  Surgeon: Thornell Flirt, DO;  [facility];  Service: Plastics;  Laterality: N/A;   I & D EXTREMITY N/A 08/13/2018   Procedure: Debridement of buttock, scrotum and left leg, placement of acell and vac;  Surgeon: Thornell Flirt, DO;  [facility];  Service: Plastics;  Laterality: N/A;  90 min, please   INCISION AND DRAINAGE HIP Left 09/18/2019   Procedure: IRRIGATION AND DEBRIDEMENT HIP/ PELVIS WITH WOUND VAC PLACEMENT;  Surgeon: Laneta Pintos, MD;  [facility];  Service: Orthopedics;  Laterality: Left;   INCISION AND DRAINAGE OF WOUND N/A 07/18/2018   Procedure: Debridement of left leg, buttocks and scrotal wound with placement of acell and Flexiseal;  Surgeon: Thornell Flirt, DO;  [facility];  Service: Plastics;  Laterality: N/A;   INCISION AND DRAINAGE OF WOUND Left 08/29/2018   Procedure: Debridement of buttock, scrotum and left leg, placement of acell and vac;  Surgeon: Thornell Flirt, DO;  [facility];  Service: Plastics;  Laterality: Left;  75 min, please   INCISION AND DRAINAGE OF WOUND Bilateral 10/23/2018   Procedure: DEBRIDEMENT OF BUTTOCK,SCROTUM, AND LEG WOUNDS WITH PLACEMENT OF ACELL- BILATERAL 90 MIN;  Surgeon: Thornell Flirt, DO;  [facility];  Service: Plastics;  Laterality: Bilateral;   IR ANGIOGRAM PELVIS SELECTIVE OR SUPRASELECTIVE  07/10/2018   IR ANGIOGRAM PELVIS SELECTIVE OR SUPRASELECTIVE  07/10/2018   IR ANGIOGRAM SELECTIVE EACH ADDITIONAL VESSEL  07/10/2018   IR EMBO ART  VEN HEMORR LYMPH EXTRAV  INC GUIDE ROADMAPPING  07/10/2018   IR GASTROSTOMY TUBE MOD SED  01/21/2021   IR NEPHROSTOMY PLACEMENT LEFT  04/05/2019   IR NEPHROSTOMY PLACEMENT RIGHT  05/31/2019   IR US  GUIDE BX ASP/DRAIN  07/10/2018   IR US  GUIDE VASC ACCESS RIGHT  07/10/2018   IR VENO/EXT/UNI LEFT  07/10/2018   IRRIGATION AND DEBRIDEMENT OF WOUND WITH SPLIT THICKNESS SKIN GRAFT Left 09/19/2018   Procedure: Debridement of gluteal wound  with placement of acell to left leg.;  Surgeon: Thornell Flirt, DO;  [facility];  Service: Plastics;  Laterality: Left;  2.5 hours, please   LAPAROTOMY N/A 07/12/2018   Procedure: EXPLORATORY LAPAROTOMY;  Surgeon: Dorena Gander, MD;  Location: [facility] OR;  Service: General;  Laterality: N/A;   LAPAROTOMY N/A 07/15/2018   Procedure: WOUND EXPLORATION; CLOSURE OF ABDOMEN;  Surgeon: Dorena Gander, MD;  Location: [facility] OR;  Service: General;  Laterality: N/A;   LAPAROTOMY  07/10/2018   Procedure: Exploratory Laparotomy;  Surgeon: Adalberto Acton, MD;  Location: [facility] OR;  Service: General;;   MASS EXCISION Left 09/18/2019   Procedure: EXCISION UPPER LEFT INNER THIGH WOUND;  Surgeon: Thornell Flirt, DO;  [facility];  Service: Plastics;  Laterality: Left;   NEPHROLITHOTOMY Right 07/15/2019   Procedure: NEPHROLITHOTOMY PERCUTANEOUS;  Surgeon: Marco Severs, MD;  Location: [facility];  Service: Urology;  Laterality: Right;  90 MINS   PEG PLACEMENT N/A 08/14/2018   Procedure: PERCUTANEOUS ENDOSCOPIC GASTROSTOMY (PEG) PLACEMENT;  Surgeon: Hildy Lowers,  Tommas Fragmin, MD;  Location: [facility] ENDOSCOPY;  Service: General;  Laterality: N/A;   PERCUTANEOUS TRACHEOSTOMY N/A 08/02/2018   Procedure: PERCUTANEOUS TRACHEOSTOMY;  Surgeon: Dorena Gander, MD;  Location: [facility] OR;  Service: General;  Laterality: N/A;   RADIOLOGY WITH ANESTHESIA N/A 07/10/2018   Procedure: IR WITH ANESTHESIA;  Surgeon: Robbi Childs, MD;  Location: [facility] OR;  Service: Radiology;  Laterality: N/A;   RADIOLOGY WITH ANESTHESIA Right 07/10/2018   Procedure: Ir With Anesthesia;  Surgeon: Robbi Childs, MD;  Location: [facility] OR;  Service: Radiology;  Laterality: Right;   SAVORY DILATION N/A 11/02/2022   Procedure: SAVORY DILATION;  Surgeon: Brice Campi Albino Alu., MD;  Location: [facility];  Service: Gastroenterology;  Laterality: N/A;   SCROTAL EXPLORATION N/A 07/15/2018   Procedure: SCROTUM DEBRIDEMENT;  Surgeon: Trent Frizzle, MD;   Location: [facility] OR;  Service: Urology;  Laterality: N/A;   SHOULDER SURGERY     SKIN SPLIT GRAFT Right 09/19/2018   Procedure: Skin Graft Split Thickness;  Surgeon: Thornell Flirt, DO;  [facility];  Service: Plastics;  Laterality: Right;   SKIN SPLIT GRAFT N/A 10/03/2018   Procedure: Split thickness skin graft to gluteal area with acell placement;  Surgeon: Thornell Flirt, DO;  [facility];  Service: Plastics;  Laterality: N/A;  3 hours, please   VACUUM ASSISTED CLOSURE CHANGE N/A 07/12/2018   Procedure: ABDOMINAL VACUUM ASSISTED CLOSURE CHANGE and abdominal washout;  Surgeon: Dorena Gander, MD;  Location: [facility] OR;  Service: General;  Laterality: N/A;   WOUND DEBRIDEMENT Left 07/23/2018   Procedure: DEBRIDEMENT LEFT BUTTOCK  WOUND;  Surgeon: Dorena Gander, MD;  Location: [facility] OR;  Service: General;  Laterality: Left;   WOUND EXPLORATION Left 07/10/2018   Procedure: WOUND EXPLORATION LEFT GROIN;  Surgeon: Adalberto Acton, MD;  Location[facility];  Service: General;  Laterality: Left;   Past Medical History:  Diagnosis Date   Acute on chronic respiratory failure with hypoxia (HCC) 06/2018   trach removed 11-16-2018, on vent from jan until may 2020 - uses albuterol  prn   Anxiety    Bacteremia due to Pseudomonas 06/2018   Chronic osteomyelitis (HCC)    Chronic pain syndrome    Clostridium difficile colitis 10/30/2019   tx with abx    Depression    DVT (deep venous thrombosis) (HCC) 2020   right brachial post PICC line   History of blood transfusion 06/2018   History of Clostridioides difficile colitis    History of kidney stones    Hypertension    norvasc  d/c by pcp on 11/05/19   Multiple traumatic injuries    Penile pain 11/18/2019   Pneumonia 11/2009   2020 x 2   Walker as ambulation aid    Wheelchair bound    electric   Wound discharge    left hip wound with bloody/clear drainage change dressing q day surgilube with gauze, between legs wound using calcium  algenate pad  bid   BP 127/85 (BP Location: Left Arm, Patient Position: Sitting)   Pulse 98   Ht 6\' 3"  (1.905 m)   Wt 196 lb (88.9 kg)   SpO2 98%   BMI 24.50 kg/m   Opioid Risk Score:   Fall Risk Score:  `1  Depression screen [facility] 2/9     11/02/2023   10:44 AM 06/12/2023   11:29 AM 06/01/2023    1:21 PM 05/04/2023   10:16 AM 05/01/2023   11:12 AM 05/01/2023   11:08 AM 02/06/2023   10:59 AM  Depression  screen PHQ 2/9  Decreased Interest 0 1 2 2   0 1  Down, Depressed, Hopeless 0 1 2 2 1  0 1  PHQ - 2 Score 0 2 4 4 1  0 2  Altered sleeping 0  2 3     Tired, decreased energy 0  2 2     Change in appetite 0  2 3     Feeling bad or failure about yourself  0  2 2     Trouble concentrating 0  2 2     Moving slowly or fidgety/restless 0  1 2     Suicidal thoughts 0  1 0     PHQ-9 Score 0  16 18     Difficult doing work/chores Not difficult at all  Somewhat difficult Very difficult        Review of Systems  All other systems reviewed and are negative.      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 S/P screw removal and on IV ABX for L hip osteomyelitis. 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  B/L tennis elbow new-and B/L pending/forming ulnar neuropathy Osteomyelitis of L hip found again-on  PO vanc and Cipro   with new PEG- 7/22- and colostomy from  before.   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  Has R severe ulnar neuropathy as well. O2 3L full time.   Here for f/u on multitrauma and chronic L hip osteomyelitis with associated chronic pain.     Will refill Duragesic  patch 75 mcg q2 days- # 15- no refills  2. Will refill MSIR 15 mg Q3 hours prn-  #200-  3. Will refill Imipramine  25 mg TID for bladder spasms 6 months   4. Will refill Gabapentin  300 mg TID- for nerve pain- 12 months supply.    5. We discussed trying to avoid naps more than 1x/afternoon.    6.  If gets rid of Imipramine , Gemtesa and Oxybutynin , then could be a lot more awake!!! -strongly suggest Botox asap  7. Urology doesn't want to put on preventative Antibiotic- and always on Cipro  for osteomyelitis-    8. Rear joystick- timing?doesn't know what company it is? [employer]?but not sure which company came out to home.    9. Botox timing? Sounds like Worker's comp working on things now could really change his wakefulness- and really think would be life changing for him  10. F/U in 7/21- and then every 6 weeks.   11. Discussed plan with Worker's Comp-   I spent a total of  34  minutes on total care today- >50% coordination of care- due to d/w worker's comp as well as d/w wife about plan as detailed above.

## 2023-11-20 ENCOUNTER — Encounter: Payer: Self-pay | Attending: Physical Medicine and Rehabilitation | Admitting: Physical Medicine and Rehabilitation

## 2023-11-20 ENCOUNTER — Encounter: Payer: Self-pay | Admitting: Physical Medicine and Rehabilitation

## 2023-11-20 VITALS — BP 127/85 | HR 98 | Ht 75.0 in | Wt 196.0 lb

## 2023-11-20 DIAGNOSIS — Z79891 Long term (current) use of opiate analgesic: Secondary | ICD-10-CM | POA: Insufficient documentation

## 2023-11-20 DIAGNOSIS — G894 Chronic pain syndrome: Secondary | ICD-10-CM | POA: Insufficient documentation

## 2023-11-20 DIAGNOSIS — Z993 Dependence on wheelchair: Secondary | ICD-10-CM | POA: Insufficient documentation

## 2023-11-20 DIAGNOSIS — Z9359 Other cystostomy status: Secondary | ICD-10-CM | POA: Insufficient documentation

## 2023-11-20 DIAGNOSIS — R413 Other amnesia: Secondary | ICD-10-CM | POA: Diagnosis present

## 2023-11-20 DIAGNOSIS — F064 Anxiety 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: Secondary | ICD-10-CM | POA: Insufficient documentation

## 2023-11-20 DIAGNOSIS — N3289 Other specified disorders of bladder: Secondary | ICD-10-CM | POA: Insufficient documentation

## 2023-11-20 DIAGNOSIS — Z5181 Encounter for therapeutic drug level monitoring: Secondary | ICD-10-CM | POA: Diagnosis present

## 2023-11-20 DIAGNOSIS — M8668 Other chronic osteomyelitis, other site: Secondary | ICD-10-CM | POA: Insufficient documentation

## 2023-11-20 MED ORDER — FENTANYL 75 MCG/HR TD PT72: 1.0000 | MEDICATED_PATCH | TRANSDERMAL | 0 refills | Status: DC

## 2023-11-20 MED ORDER — MORPHINE SULFATE 15 MG PO TABS: 15.0000 mg | ORAL_TABLET | ORAL | 0 refills | Status: DC | PRN

## 2023-11-20 MED ORDER — GABAPENTIN 300 MG PO CAPS: 300.0000 mg | ORAL_CAPSULE | Freq: Three times a day (TID) | ORAL | 3 refills | Status: AC

## 2023-11-20 MED ORDER — IMIPRAMINE HCL 25 MG PO TABS: 25.0000 mg | ORAL_TABLET | Freq: Three times a day (TID) | ORAL | 1 refills | Status: DC

## 2023-11-20 NOTE — Patient Instructions (Signed)
 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 S/P screw removal and on IV ABX for L hip osteomyelitis. 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  Has R severe ulnar neuropathy as well. O2 3L full time.   Here for f/u on multitrauma and chronic L hip osteomyelitis with associated chronic pain.     Will refill Duragesic  patch 75 mcg q2 days- # 15- no refills  2. Will refill MSIR 15 mg Q3 hours prn-  #200-  3. Will refill Imipramine  25 mg TID for bladder spasms 6 months   4. Will refill Gabapentin  300 mg TID- for nerve pain- 12 months supply.    5. We discussed trying to avoid naps more than 1x/afternoon.    6.  If gets rid of Imipramine , Gemtesa and Oxybutynin , then could be a lot more awake!!! -strongly suggest Botox asap  7. Urology doesn't want to put on preventative Antibiotic- and always on Cipro  for osteomyelitis-    8. Rear joystick- timing?doesn't know what company it is? [employer]?but not sure which company came out to home.    9. Botox timing? Sounds like Worker's comp working on things now could really change his wakefulness- and really think would be life changing for him  10. F/U in 7/21- and then every 6 weeks.

## 2023-11-25 ENCOUNTER — Other Ambulatory Visit: Payer: Self-pay | Admitting: Family Medicine

## 2023-11-27 ENCOUNTER — Ambulatory Visit: Payer: Self-pay | Admitting: Physical Medicine and Rehabilitation

## 2023-11-27 ENCOUNTER — Other Ambulatory Visit: Payer: Self-pay | Admitting: Family Medicine

## 2023-11-27 LAB — DRUG TOX MONITOR 1 W/CONF, ORAL FLD
Alprazolam: NEGATIVE ng/mL (ref <0.50)
Aminoclonazepam: NEGATIVE ng/mL (ref <0.50)
Amphetamines: NEGATIVE ng/mL (ref <10)
Barbiturates: NEGATIVE ng/mL (ref <10)
Benzodiazepines: POSITIVE ng/mL — AB (ref <0.50)
Buprenorphine: NEGATIVE ng/mL (ref <0.10)
Chlordiazepoxide: NEGATIVE ng/mL (ref <0.50)
Clonazepam: NEGATIVE ng/mL (ref <0.50)
Cocaine: NEGATIVE ng/mL (ref <5.0)
Codeine: NEGATIVE ng/mL (ref <2.5)
Diazepam: NEGATIVE ng/mL (ref <0.50)
Dihydrocodeine: NEGATIVE ng/mL (ref <2.5)
Fentanyl: 14.8 ng/mL — ABNORMAL HIGH (ref <0.10)
Fentanyl: POSITIVE ng/mL — AB (ref <0.10)
Flunitrazepam: NEGATIVE ng/mL (ref <0.50)
Flurazepam: NEGATIVE ng/mL (ref <0.50)
Heroin Metabolite: NEGATIVE ng/mL (ref <1.0)
Hydrocodone: NEGATIVE ng/mL (ref <2.5)
Hydromorphone: NEGATIVE ng/mL (ref <2.5)
Lorazepam: 1.04 ng/mL — ABNORMAL HIGH (ref <0.50)
MARIJUANA: NEGATIVE ng/mL (ref <2.5)
MDMA: NEGATIVE ng/mL (ref <10)
Meprobamate: NEGATIVE ng/mL (ref <2.5)
Methadone: NEGATIVE ng/mL (ref <5.0)
Midazolam: NEGATIVE ng/mL (ref <0.50)
Morphine: >250 ng/mL — ABNORMAL HIGH (ref <2.5)
Nicotine Metabolite: NEGATIVE ng/mL (ref <5.0)
Nordiazepam: NEGATIVE ng/mL (ref <0.50)
Norhydrocodone: NEGATIVE ng/mL (ref <2.5)
Noroxycodone: NEGATIVE ng/mL (ref <2.5)
Opiates: POSITIVE ng/mL — AB (ref <2.5)
Oxazepam: NEGATIVE ng/mL (ref <0.50)
Oxycodone: NEGATIVE ng/mL (ref <2.5)
Oxymorphone: NEGATIVE ng/mL (ref <2.5)
Phencyclidine: NEGATIVE ng/mL (ref <10)
Tapentadol: NEGATIVE ng/mL (ref <5.0)
Temazepam: NEGATIVE ng/mL (ref <0.50)
Tramadol: NEGATIVE ng/mL (ref <5.0)
Triazolam: NEGATIVE ng/mL (ref <0.50)
Zolpidem: NEGATIVE ng/mL (ref <5.0)

## 2023-11-27 LAB — DRUG TOX ALC METAB W/CON, ORAL FLD: Alcohol Metabolite: NEGATIVE ng/mL (ref <25)

## 2023-11-30 ENCOUNTER — Telehealth: Payer: Self-pay | Admitting: Family Medicine

## 2023-11-30 NOTE — Telephone Encounter (Signed)
 [facility] faxed Home Health Certificate (Order [place] [id number]), to be filled out by provider. Patient requested to send it back via Fax within ASAP. Document is located in providers tray at front office.Please advise at [phone].

## 2023-12-01 DIAGNOSIS — M86652 Other chronic osteomyelitis, left thigh: Secondary | ICD-10-CM | POA: Diagnosis not present

## 2023-12-01 DIAGNOSIS — G9340 Encephalopathy, unspecified: Secondary | ICD-10-CM

## 2023-12-01 DIAGNOSIS — I10 Essential (primary) hypertension: Secondary | ICD-10-CM | POA: Diagnosis not present

## 2023-12-01 DIAGNOSIS — G8220 Paraplegia, unspecified: Secondary | ICD-10-CM | POA: Diagnosis not present

## 2023-12-01 DIAGNOSIS — I700 Atherosclerosis of aorta: Secondary | ICD-10-CM | POA: Diagnosis not present

## 2023-12-01 DIAGNOSIS — F32A Depression, unspecified: Secondary | ICD-10-CM

## 2023-12-01 DIAGNOSIS — Z433 Encounter for attention to colostomy: Secondary | ICD-10-CM

## 2023-12-01 DIAGNOSIS — Z87440 Personal history of urinary (tract) infections: Secondary | ICD-10-CM

## 2023-12-01 DIAGNOSIS — Z435 Encounter for attention to cystostomy: Secondary | ICD-10-CM

## 2023-12-01 DIAGNOSIS — J9611 Chronic respiratory failure with hypoxia: Secondary | ICD-10-CM

## 2023-12-01 DIAGNOSIS — G894 Chronic pain syndrome: Secondary | ICD-10-CM

## 2023-12-01 DIAGNOSIS — E43 Unspecified severe protein-calorie malnutrition: Secondary | ICD-10-CM

## 2023-12-01 NOTE — Telephone Encounter (Signed)
Form faxed to [phone] Form placed to be scan in patient chart

## 2023-12-01 NOTE — Telephone Encounter (Signed)
Placed to be reviewed in PCP office

## 2023-12-05 ENCOUNTER — Other Ambulatory Visit: Payer: Self-pay | Admitting: Family Medicine

## 2023-12-05 ENCOUNTER — Telehealth: Payer: Self-pay | Admitting: Registered Nurse

## 2023-12-05 ENCOUNTER — Telehealth: Payer: Self-pay

## 2023-12-05 DIAGNOSIS — E876 Hypokalemia: Secondary | ICD-10-CM

## 2023-12-05 MED ORDER — OXYCODONE HCL 10 MG PO TABS: ORAL_TABLET | ORAL | 0 refills | Status: DC

## 2023-12-05 NOTE — Telephone Encounter (Signed)
 Fidela NP will contact Mrs. Comer.

## 2023-12-05 NOTE — Telephone Encounter (Signed)
 error

## 2023-12-05 NOTE — Telephone Encounter (Signed)
 Dr. Cornelio is out of the office:   Rx Morphine  Sulf IR 15 MG is on back order. Patient is out the Rx and Mrs. Heck has checked all local pharmacies.  Please advise. Thank you.  Local pharmacy [facility] [address] in [place].   -Call back phone [phone].

## 2023-12-05 NOTE — Telephone Encounter (Signed)
 Placed a call to Mrs. Glade, no answer.  Left message to return the call.  His pharmacy is closed at this time, will call when they open.  [facility]  Ms. Angus return the call  MSIR prescription discontinued and Oxycodone  e-scribed to pharmacy.  Tye Kitty will call Ms. Bucker regarding the above.

## 2023-12-05 NOTE — Telephone Encounter (Signed)
 Fidela Ned NP is taking care of the Morphine  Rx request.   Placed a call to Mrs. Camargo, no answer.  Left message to return the call.  His pharmacy is closed at this time, will call when they open.  [facility]  Ms. Wurm return the call  MSIR prescription discontinued and Oxycodone  e-scribed to pharmacy.  Tye Kitty will call Ms. Stanczak regarding the above.      Per Fidela have Mrs. Redford call the  [employer].

## 2024-01-01 ENCOUNTER — Telehealth: Payer: Self-pay | Admitting: Physical Medicine and Rehabilitation

## 2024-01-01 ENCOUNTER — Encounter: Payer: PRIVATE HEALTH INSURANCE | Admitting: Physical Medicine and Rehabilitation

## 2024-01-01 MED ORDER — OXYCODONE HCL 10 MG PO TABS: 15.0000 mg | ORAL_TABLET | ORAL | 0 refills | Status: DC | PRN

## 2024-01-01 MED ORDER — FENTANYL 75 MCG/HR TD PT72: 1.0000 | MEDICATED_PATCH | TRANSDERMAL | 0 refills | Status: DC

## 2024-01-01 NOTE — Telephone Encounter (Signed)
 Pt's MSIR was changed to Oxycodone  10 mg however it's not working as well and taking it EVERY 3 hours- will increase to Oxy 10-15 mg q3 hours prn- and give 260 tabs-

## 2024-01-03 ENCOUNTER — Telehealth: Payer: Self-pay

## 2024-01-03 NOTE — Telephone Encounter (Signed)
 Pharmacy sent fax asking to send new Rx for Oxycodone  10 mg with diagnosis code on it.

## 2024-01-04 MED ORDER — OXYCODONE HCL 10 MG PO TABS: 15.0000 mg | ORAL_TABLET | ORAL | 0 refills | Status: DC | PRN

## 2024-01-17 ENCOUNTER — Encounter: Payer: Self-pay | Admitting: Physical Medicine and Rehabilitation

## 2024-01-17 ENCOUNTER — Encounter: Attending: Physical Medicine and Rehabilitation | Admitting: Physical Medicine and Rehabilitation

## 2024-01-17 VITALS — BP 143/93 | HR 89 | Ht 75.0 in

## 2024-01-17 DIAGNOSIS — T07XXXA Unspecified multiple injuries, initial encounter: Secondary | ICD-10-CM | POA: Insufficient documentation

## 2024-01-17 DIAGNOSIS — G709 Myoneural disorder, unspecified: Secondary | ICD-10-CM | POA: Insufficient documentation

## 2024-01-17 DIAGNOSIS — G8921 Chronic pain due to trauma: Secondary | ICD-10-CM | POA: Insufficient documentation

## 2024-01-17 DIAGNOSIS — Z993 Dependence on wheelchair: Secondary | ICD-10-CM | POA: Diagnosis present

## 2024-01-17 DIAGNOSIS — Z933 Colostomy status: Secondary | ICD-10-CM | POA: Diagnosis not present

## 2024-01-17 DIAGNOSIS — M8668 Other chronic osteomyelitis, other site: Secondary | ICD-10-CM | POA: Diagnosis present

## 2024-01-17 MED ORDER — MORPHINE SULFATE 15 MG PO TABS: 15.0000 mg | ORAL_TABLET | ORAL | 0 refills | Status: DC | PRN

## 2024-01-17 MED ORDER — FENTANYL 75 MCG/HR TD PT72: 1.0000 | MEDICATED_PATCH | TRANSDERMAL | 0 refills | Status: DC

## 2024-01-17 MED ORDER — QUETIAPINE FUMARATE 50 MG PO TABS: 50.0000 mg | ORAL_TABLET | Freq: Three times a day (TID) | ORAL | 5 refills | Status: DC

## 2024-01-17 NOTE — Progress Notes (Signed)
 Subjective:    Patient ID: Christopher [name], male    DOB: March 28, 1966, 58 y.o.   MRN: [id number]  HPI  Pt is a 58 yr old male with hx of Multitrauma- causing L femoral neck fx, degloving of L hip to going/scrotum, bladder neck trauma- got SPC, developed compartment syndrome -s/p surgery for that; also diverting colostomy, skin grafts, and s/p trach and PEG- PEG is out. 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 S/P screw removal and on IV ABX for L hip osteomyelitis. 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  Has R severe ulnar neuropathy as well. O2 3L full time.   Here for f/u on multitrauma and chronic L hip osteomyelitis with associated chronic pain.     Feels like pain getting worse. Per pt.  Not sleeping either- wild all night  Bladder is exacerbating the issue.  Having bladder spasms- losing his mind.   Still waiting on approval for Botox of bladder-   Almost fell off ramp this AM and needed another joystick that I've suggested, but wasn't there.   Someone ordered the part- but that was months ago. For joystick- doesn't have   Not seeing Dr Alexa anymore- so only got meds 1 month another time for Alprazolam-   Couldn't remember why awake one night-  Air bed has helped a lot of his pain at night.   Still hearing voices when awake- as well as music- and gets paranoid about who's talking to him, when no one in room.    Pain Inventory Average Pain 9 Pain Right Now 9 My pain is constant, sharp, burning, dull, stabbing, tingling, and  aching  In the last 24 hours, has pain interfered with the following? General activity 10 Relation with others 10 Enjoyment of life 10 What TIME of day is your pain at its worst? morning , daytime, evening, and night Sleep (in general) Poor  Pain is worse with: walking, bending, sitting, inactivity, standing, and some activites Pain improves with: medication Relief from Meds: 9  Family History  Problem Relation Age of Onset   Breast cancer Mother        with mets to the bones   Social History   Socioeconomic History   Marital status: Married    Spouse name: Not on file   Number of children: Not on file   Years of education: Not on file   Highest education level: Not on file  Occupational History   Occupation: Disable  Tobacco Use   Smoking status: Former    Current packs/day: 0.00    Average packs/day: 1 pack/day for 20.0 years (20.0 ttl pk-yrs)    Types: Cigarettes    Start date: 07/10/1998    Quit date: 07/10/2018    Years since quitting: 5.5   Smokeless tobacco: Never  Vaping Use   Vaping status: Never Used  Substance and Sexual Activity   Alcohol use: Never   Drug use: Yes    Types: Oxycodone , Fentanyl     Comment: Fentanyl  patch/oxycodone  since 06/2018  Sexual activity: Yes  Other Topics Concern   Not on file  Social History Narrative   ** Merged History Encounter **       Social Drivers of Health   Financial Resource Strain: Low Risk  (03/01/2019)   Overall Financial Resource Strain (CARDIA)    Difficulty of Paying Living Expenses: Not very hard  Food Insecurity: No Food Insecurity (10/15/2023)   Hunger Vital Sign    Worried About Running Out of Food in the Last Year: Never true    Ran Out of Food in the Last Year: Never true  Transportation Needs: No Transportation Needs (10/15/2023)   PRAPARE - Administrator, Civil Service (Medical): No    Lack of Transportation (Non-Medical): No  Physical Activity: Inactive (03/01/2019)   Exercise Vital  Sign    Days of Exercise per Week: 0 days    Minutes of Exercise per Session: 0 min  Stress: Stress Concern Present (03/01/2019)   [employer] of Occupational Health - Occupational Stress Questionnaire    Feeling of Stress : Rather much  Social Connections: Not on file   Past Surgical History:  Procedure Laterality Date   APPLICATION OF A-CELL OF BACK N/A 08/06/2018   Procedure: Application Of A-Cell Of Back;  Surgeon: Lowery Estefana RAMAN, DO;  [facility];  Service: Plastics;  Laterality: N/A;   APPLICATION OF A-CELL OF EXTREMITY Left 08/06/2018   Procedure: Application Of A-Cell Of Extremity;  Surgeon: Lowery Estefana RAMAN, DO;  [facility];  Service: Plastics;  Laterality: Left;   APPLICATION OF A-CELL OF EXTREMITY Left 09/18/2019   Procedure: APPLICATION OF A-CELL OF EXTREMITY;  Surgeon: Lowery Estefana RAMAN, DO;  [facility];  Service: Plastics;  Laterality: Left;   APPLICATION OF WOUND VAC  07/12/2018   Procedure: Application Of Wound Vac to the Left Thigh and Scrotum.;  Surgeon: Kendal Franky SQUIBB, MD;  [facility];  Service: Orthopedics;;   APPLICATION OF WOUND VAC  07/10/2018   Procedure: Application Of Wound Vac;  Surgeon: Signe Mitzie LABOR, MD;  Location[facility] OR;  Service: General;;   BIOPSY  11/02/2022   Procedure: BIOPSY;  Surgeon: Wilhelmenia Aloha Raddle., MD;  Location: [facility];  Service: Gastroenterology;;   COLON SURGERY  2020   colostomy   COLOSTOMY N/A 07/23/2018   Procedure: COLOSTOMY;  Surgeon: Sebastian Moles, MD;  Location: [facility] OR;  Service: General;  Laterality: N/A;   CYSTOSCOPY W/ URETERAL STENT PLACEMENT N/A 07/15/2018   Procedure: RETROGRADE URETHROGRAM;  Surgeon: Matilda Senior, MD;  Location: [facility] OR;  Service: Urology;  Laterality: N/A;   CYSTOSCOPY WITH LITHOLAPAXY N/A 05/06/2019   Procedure: CYSTOSCOPY BASKET BLADDER STONE EXTRACTION;  Surgeon: Sherrilee Belvie CROME, MD;  Location: [facility];  Service: Urology;  Laterality:  N/A;  30 MINS   CYSTOSTOMY N/A 05/06/2019   Procedure: REPLACEMENT OF SUPRAPUBIC CATHETER;  Surgeon: Sherrilee Belvie CROME, MD;  Location: [facility];  Service: Urology;  Laterality: N/A;   DEBRIDEMENT AND CLOSURE WOUND Left 03/04/2019   Procedure: Excision of hip wound with placement of Acell;  Surgeon: Lowery Estefana RAMAN, DO;  [facility];  Service: Plastics;  Laterality: Left;   ESOPHAGOGASTRODUODENOSCOPY N/A 08/14/2018   Procedure: ESOPHAGOGASTRODUODENOSCOPY (EGD);  Surgeon: Sebastian Moles, MD;  Location: [facility] END[facility]COPY;  Service: General;  Laterality: N/A;  bedside   ESOPHAGOGASTRODUODENOSCOPY (EGD) WITH PROPOFOL  N/A 11/02/2022   Procedure: ESOPHAGOGASTRODUODENOSCOPY (EGD) WITH PROPOFOL ;  Surgeon: Wilhelmenia Aloha Raddle., MD;  Location: [facility];  Service: Gastroenterology;  Laterality: N/A;   FACIAL RECONSTRUCTION SURGERY     X 2--once as a teenager and second time in his 89's   HARDWARE REMOVAL Left 03/04/2019   Procedure: Left Hip Hardware Removal;  Surgeon: Kendal Franky SQUIBB, MD;  [facility];  Service: Orthopedics;  Laterality: Left;   HEMOSTASIS CLIP PLACEMENT  11/02/2022   Procedure: HEMOSTASIS CLIP PLACEMENT;  Surgeon: Wilhelmenia Aloha Raddle., MD;  Location: [facility];  Service: Gastroenterology;;   HIP PINNING,CANNULATED Left 07/12/2018   Procedure: CANNULATED HIP PINNING;  Surgeon: Kendal Franky SQUIBB, MD;  [facility];  Service: Orthopedics;  Laterality: Left;   HIP SURGERY     HOLMIUM LASER APPLICATION Right 07/15/2019   Procedure: HOLMIUM LASER APPLICATION;  Surgeon: Sherrilee Belvie CROME, MD;  Location: [facility];  Service: Urology;  Laterality: Right;   HOT HEMOSTASIS N/A 11/02/2022   Procedure: HOT HEMOSTASIS (ARGON PLASMA COAGULATION/BICAP);  Surgeon: Wilhelmenia Aloha Raddle., MD;  Location: [facility];  Service: Gastroenterology;  Laterality: N/A;   I & D EXTREMITY Left 07/25/2018   Procedure: Debridement of buttock, scrotum and left leg,  placement of acell and vac;  Surgeon: Lowery Estefana RAMAN, DO;  [facility];  Service: Plastics;  Laterality: Left;   I & D EXTREMITY N/A 08/06/2018   Procedure: Debridement of buttock, scrotum and left leg;  Surgeon: Lowery Estefana RAMAN, DO;  [facility];  Service: Plastics;  Laterality: N/A;   I & D EXTREMITY N/A 08/13/2018   Procedure: Debridement of buttock, scrotum and left leg, placement of acell and vac;  Surgeon: Lowery Estefana RAMAN, DO;  [facility];  Service: Plastics;  Laterality: N/A;  90 min, please   INCISION AND DRAINAGE HIP Left 09/18/2019   Procedure: IRRIGATION AND DEBRIDEMENT HIP/ PELVIS WITH WOUND VAC PLACEMENT;  Surgeon: Kendal Franky SQUIBB, MD;  [facility];  Service: Orthopedics;  Laterality: Left;   INCISION AND DRAINAGE OF WOUND N/A 07/18/2018   Procedure: Debridement of left leg, buttocks and scrotal wound with placement of acell and Flexiseal;  Surgeon: Lowery Estefana RAMAN, DO;  [facility];  Service: Plastics;  Laterality: N/A;   INCISION AND DRAINAGE OF WOUND Left 08/29/2018   Procedure: Debridement of buttock, scrotum and left leg, placement of acell and vac;  Surgeon: Lowery Estefana RAMAN, DO;  [facility];  Service: Plastics;  Laterality: Left;  75 min, please   INCISION AND DRAINAGE OF WOUND Bilateral 10/23/2018   Procedure: DEBRIDEMENT OF BUTTOCK,SCROTUM, AND LEG WOUNDS WITH PLACEMENT OF ACELL- BILATERAL 90 MIN;  Surgeon: Lowery Estefana RAMAN, DO;  [facility];  Service: Plastics;  Laterality: Bilateral;   IR ANGIOGRAM PELVIS SELECTIVE OR SUPRASELECTIVE  07/10/2018   IR ANGIOGRAM PELVIS SELECTIVE OR SUPRASELECTIVE  07/10/2018   IR ANGIOGRAM SELECTIVE EACH ADDITIONAL VESSEL  07/10/2018   IR EMBO ART  VEN HEMORR LYMPH EXTRAV  INC GUIDE ROADMAPPING  07/10/2018   IR GASTROSTOMY TUBE MOD SED  01/21/2021   IR NEPHROSTOMY PLACEMENT LEFT  04/05/2019   IR NEPHROSTOMY PLACEMENT RIGHT  05/31/2019   IR US  GUIDE BX ASP/DRAIN  07/10/2018   IR US  GUIDE VASC ACCESS  RIGHT  07/10/2018   IR VENO/EXT/UNI LEFT  07/10/2018   IRRIGATION AND DEBRIDEMENT OF WOUND WITH SPLIT THICKNESS SKIN GRAFT Left 09/19/2018   Procedure: Debridement of gluteal wound with placement of acell to left leg.;  Surgeon: Lowery Estefana RAMAN, DO;  [facility];  Service: Plastics;  Laterality: Left;  2.5 hours,  please   LAPAROTOMY N/A 07/12/2018   Procedure: EXPLORATORY LAPAROTOMY;  Surgeon: Sebastian Moles, MD;  Location: [facility] OR;  Service: General;  Laterality: N/A;   LAPAROTOMY N/A 07/15/2018   Procedure: WOUND EXPLORATION; CLOSURE OF ABDOMEN;  Surgeon: Sebastian Moles, MD;  Location: [facility] OR;  Service: General;  Laterality: N/A;   LAPAROTOMY  07/10/2018   Procedure: Exploratory Laparotomy;  Surgeon: Signe Mitzie LABOR, MD;  Location: [facility] OR;  Service: General;;   MASS EXCISION Left 09/18/2019   Procedure: EXCISION UPPER LEFT INNER THIGH WOUND;  Surgeon: Lowery Estefana RAMAN, DO;  [facility];  Service: Plastics;  Laterality: Left;   NEPHROLITHOTOMY Right 07/15/2019   Procedure: NEPHROLITHOTOMY PERCUTANEOUS;  Surgeon: Sherrilee Belvie CROME, MD;  Location: [facility];  Service: Urology;  Laterality: Right;  90 MINS   PEG PLACEMENT N/A 08/14/2018   Procedure: PERCUTANEOUS ENDOSCOPIC GASTROSTOMY (PEG) PLACEMENT;  Surgeon: Sebastian Moles, MD;  Location: [facility] ENDOSCOPY;  Service: General;  Laterality: N/A;   PERCUTANEOUS TRACHEOSTOMY N/A 08/02/2018   Procedure: PERCUTANEOUS TRACHEOSTOMY;  Surgeon: Sebastian Moles, MD;  Location: [facility] OR;  Service: General;  Laterality: N/A;   RADIOLOGY WITH ANESTHESIA N/A 07/10/2018   Procedure: IR WITH ANESTHESIA;  Surgeon: Adele Rush, MD;  Location: [facility] OR;  Service: Radiology;  Laterality: N/A;   RADIOLOGY WITH ANESTHESIA Right 07/10/2018   Procedure: Ir With Anesthesia;  Surgeon: Adele Rush, MD;  Location: [facility] OR;  Service: Radiology;  Laterality: Right;   SAVORY DILATION N/A 11/02/2022   Procedure: SAVORY DILATION;  Surgeon: Wilhelmenia Aloha Raddle., MD;   Location: [facility];  Service: Gastroenterology;  Laterality: N/A;   SCROTAL EXPLORATION N/A 07/15/2018   Procedure: SCROTUM DEBRIDEMENT;  Surgeon: Matilda Senior, MD;  Location: [facility] OR;  Service: Urology;  Laterality: N/A;   SHOULDER SURGERY     SKIN SPLIT GRAFT Right 09/19/2018   Procedure: Skin Graft Split Thickness;  Surgeon: Lowery Estefana RAMAN, DO;  [facility];  Service: Plastics;  Laterality: Right;   SKIN SPLIT GRAFT N/A 10/03/2018   Procedure: Split thickness skin graft to gluteal area with acell placement;  Surgeon: Lowery Estefana RAMAN, DO;  [facility];  Service: Plastics;  Laterality: N/A;  3 hours, please   VACUUM ASSISTED CLOSURE CHANGE N/A 07/12/2018   Procedure: ABDOMINAL VACUUM ASSISTED CLOSURE CHANGE and abdominal washout;  Surgeon: Sebastian Moles, MD;  Location: [facility] OR;  Service: General;  Laterality: N/A;   WOUND DEBRIDEMENT Left 07/23/2018   Procedure: DEBRIDEMENT LEFT BUTTOCK  WOUND;  Surgeon: Sebastian Moles, MD;  Location: [facility] OR;  Service: General;  Laterality: Left;   WOUND EXPLORATION Left 07/10/2018   Procedure: WOUND EXPLORATION LEFT GROIN;  Surgeon: Signe Mitzie LABOR, MD;  Location: [facility] OR;  Service: General;  Laterality: Left;   Past Surgical History:  Procedure Laterality Date   APPLICATION OF A-CELL OF BACK N/A 08/06/2018   Procedure: Application Of A-Cell Of Back;  Surgeon: Lowery Estefana RAMAN, DO;  [facility];  Service: Plastics;  Laterality: N/A;   APPLICATION OF A-CELL OF EXTREMITY Left 08/06/2018   Procedure: Application Of A-Cell Of Extremity;  Surgeon: Lowery Estefana RAMAN, DO;  [facility];  Service: Plastics;  Laterality: Left;   APPLICATION OF A-CELL OF EXTREMITY Left 09/18/2019   Procedure: APPLICATION OF A-CELL OF EXTREMITY;  Surgeon: Lowery Estefana RAMAN, DO;  [facility];  Service: Plastics;  Laterality: Left;   APPLICATION OF WOUND VAC  07/12/2018   Procedure: Application Of Wound Vac to the Left Thigh and  Scrotum.;  Surgeon:  Kendal Franky SQUIBB, MD;  [facility];  Service: Orthopedics;;   APPLICATION OF WOUND VAC  07/10/2018   Procedure: Application Of Wound Vac;  Surgeon: Signe Mitzie LABOR, MD;  Location: [facility] OR;  Service: General;;   BIOPSY  11/02/2022   Procedure: BIOPSY;  Surgeon: Wilhelmenia Aloha Raddle., MD;  Location: [facility];  Service: Gastroenterology;;   COLON SURGERY  2020   colostomy   COLOSTOMY N/A 07/23/2018   Procedure: COLOSTOMY;  Surgeon: Sebastian Moles, MD;  Location: [facility] OR;  Service: General;  Laterality: N/A;   CYSTOSCOPY W/ URETERAL STENT PLACEMENT N/A 07/15/2018   Procedure: RETROGRADE URETHROGRAM;  Surgeon: Matilda Senior, MD;  Location: [facility] OR;  Service: Urology;  Laterality: N/A;   CYSTOSCOPY WITH LITHOLAPAXY N/A 05/06/2019   Procedure: CYSTOSCOPY BASKET BLADDER STONE EXTRACTION;  Surgeon: Sherrilee Belvie CROME, MD;  Location: [facility];  Service: Urology;  Laterality: N/A;  30 MINS   CYSTOSTOMY N/A 05/06/2019   Procedure: REPLACEMENT OF SUPRAPUBIC CATHETER;  Surgeon: Sherrilee Belvie CROME, MD;  Location: [facility];  Service: Urology;  Laterality: N/A;   DEBRIDEMENT AND CLOSURE WOUND Left 03/04/2019   Procedure: Excision of hip wound with placement of Acell;  Surgeon: Lowery Estefana RAMAN, DO;  [facility];  Service: Plastics;  Laterality: Left;   ESOPHAGOGASTRODUODENOSCOPY N/A 08/14/2018   Procedure: ESOPHAGOGASTRODUODENOSCOPY (EGD);  Surgeon: Sebastian Moles, MD;  Location: [facility] ENDOSCOPY;  Service: General;  Laterality: N/A;  bedside   ESOPHAGOGASTRODUODENOSCOPY (EGD) WITH PROPOFOL  N/A 11/02/2022   Procedure: ESOPHAGOGASTRODUODENOSCOPY (EGD) WITH PROPOFOL ;  Surgeon: Wilhelmenia Aloha Raddle., MD;  Location: [facility];  Service: Gastroenterology;  Laterality: N/A;   FACIAL RECONSTRUCTION SURGERY     X 2--once as a teenager and second time in his 72's   HARDWARE REMOVAL Left 03/04/2019   Procedure: Left Hip Hardware Removal;  Surgeon: Kendal Franky SQUIBB, MD;   [facility];  Service: Orthopedics;  Laterality: Left;   HEMOSTASIS CLIP PLACEMENT  11/02/2022   Procedure: HEMOSTASIS CLIP PLACEMENT;  Surgeon: Wilhelmenia Aloha Raddle., MD;  Location: [facility];  Service: Gastroenterology;;   HIP PINNING,CANNULATED Left 07/12/2018   Procedure: CANNULATED HIP PINNING;  Surgeon: Kendal Franky SQUIBB, MD;  [facility];  Service: Orthopedics;  Laterality: Left;   HIP SURGERY     HOLMIUM LASER APPLICATION Right 07/15/2019   Procedure: HOLMIUM LASER APPLICATION;  Surgeon: Sherrilee Belvie CROME, MD;  Location: [facility];  Service: Urology;  Laterality: Right;   HOT HEMOSTASIS N/A 11/02/2022   Procedure: HOT HEMOSTASIS (ARGON PLASMA COAGULATION/BICAP);  Surgeon: Wilhelmenia Aloha Raddle., MD;  Location: [facility];  Service: Gastroenterology;  Laterality: N/A;   I & D EXTREMITY Left 07/25/2018   Procedure: Debridement of buttock, scrotum and left leg, placement of acell and vac;  Surgeon: Lowery Estefana RAMAN, DO;  [facility];  Service: Plastics;  Laterality: Left;   I & D EXTREMITY N/A 08/06/2018   Procedure: Debridement of buttock, scrotum and left leg;  Surgeon: Lowery Estefana RAMAN, DO;  [facility];  Service: Plastics;  Laterality: N/A;   I & D EXTREMITY N/A 08/13/2018   Procedure: Debridement of buttock, scrotum and left leg, placement of acell and vac;  Surgeon: Lowery Estefana RAMAN, DO;  [facility];  Service: Plastics;  Laterality: N/A;  90 min, please   INCISION AND DRAINAGE HIP Left 09/18/2019   Procedure: IRRIGATION AND DEBRIDEMENT HIP/ PELVIS WITH WOUND VAC PLACEMENT;  Surgeon: Kendal Franky SQUIBB, MD;  [facility];  Service: Orthopedics;  Laterality: Left;   INCISION AND DRAINAGE OF WOUND N/A 07/18/2018   Procedure: Debridement of left leg, buttocks and scrotal wound with placement of acell and Flexiseal;  Surgeon: Lowery Estefana RAMAN, DO;  [facility];  Service: Plastics;  Laterality: N/A;   INCISION AND DRAINAGE OF WOUND Left  08/29/2018   Procedure: Debridement of buttock, scrotum and left leg, placement of acell and vac;  Surgeon: Lowery Estefana RAMAN, DO;  [facility];  Service: Plastics;  Laterality: Left;  75 min, please   INCISION AND DRAINAGE OF WOUND Bilateral 10/23/2018   Procedure: DEBRIDEMENT OF BUTTOCK,SCROTUM, AND LEG WOUNDS WITH PLACEMENT OF ACELL- BILATERAL 90 MIN;  Surgeon: Lowery Estefana RAMAN, DO;  [facility];  Service: Plastics;  Laterality: Bilateral;   IR ANGIOGRAM PELVIS SELECTIVE OR SUPRASELECTIVE  07/10/2018   IR ANGIOGRAM PELVIS SELECTIVE OR SUPRASELECTIVE  07/10/2018   IR ANGIOGRAM SELECTIVE EACH ADDITIONAL VESSEL  07/10/2018   IR EMBO ART  VEN HEMORR LYMPH EXTRAV  INC GUIDE ROADMAPPING  07/10/2018   IR GASTROSTOMY TUBE MOD SED  01/21/2021   IR NEPHROSTOMY PLACEMENT LEFT  04/05/2019   IR NEPHROSTOMY PLACEMENT RIGHT  05/31/2019   IR US  GUIDE BX ASP/DRAIN  07/10/2018   IR US  GUIDE VASC ACCESS RIGHT  07/10/2018   IR VENO/EXT/UNI LEFT  07/10/2018   IRRIGATION AND DEBRIDEMENT OF WOUND WITH SPLIT THICKNESS SKIN GRAFT Left 09/19/2018   Procedure: Debridement of gluteal wound with placement of acell to left leg.;  Surgeon: Lowery Estefana RAMAN, DO;  [facility];  Service: Plastics;  Laterality: Left;  2.5 hours, please   LAPAROTOMY N/A 07/12/2018   Procedure: EXPLORATORY LAPAROTOMY;  Surgeon: Sebastian Moles, MD;  Location: [facility] OR;  Service: General;  Laterality: N/A;   LAPAROTOMY N/A 07/15/2018   Procedure: WOUND EXPLORATION; CLOSURE OF ABDOMEN;  Surgeon: Sebastian Moles, MD;  Location: [facility] OR;  Service: General;  Laterality: N/A;   LAPAROTOMY  07/10/2018   Procedure: Exploratory Laparotomy;  Surgeon: Signe Mitzie LABOR, MD;  Location: [facility] OR;  Service: General;;   MASS EXCISION Left 09/18/2019   Procedure: EXCISION UPPER LEFT INNER THIGH WOUND;  Surgeon: Lowery Estefana RAMAN, DO;  [facility];  Service: Plastics;  Laterality: Left;   NEPHROLITHOTOMY Right 07/15/2019   Procedure: NEPHROLITHOTOMY  PERCUTANEOUS;  Surgeon: Sherrilee Belvie CROME, MD;  Location: [facility];  Service: Urology;  Laterality: Right;  90 MINS   PEG PLACEMENT N/A 08/14/2018   Procedure: PERCUTANEOUS ENDOSCOPIC GASTROSTOMY (PEG) PLACEMENT;  Surgeon: Sebastian Moles, MD;  Location: [facility] ENDOSCOPY;  Service: General;  Laterality: N/A;   PERCUTANEOUS TRACHEOSTOMY N/A 08/02/2018   Procedure: PERCUTANEOUS TRACHEOSTOMY;  Surgeon: Sebastian Moles, MD;  Location: [facility] OR;  Service: General;  Laterality: N/A;   RADIOLOGY WITH ANESTHESIA N/A 07/10/2018   Procedure: IR WITH ANESTHESIA;  Surgeon: Adele Rush, MD;  Location: [facility] OR;  Service: Radiology;  Laterality: N/A;   RADIOLOGY WITH ANESTHESIA Right 07/10/2018   Procedure: Ir With Anesthesia;  Surgeon: Adele Rush, MD;  Location: [facility];  Service: Radiology;  Laterality: Right;   SAVORY DILATION N/A 11/02/2022   Procedure: SAVORY DILATION;  Surgeon: Wilhelmenia Aloha Raddle., MD;  Location: [facility];  Service: Gastroenterology;  Laterality: N/A;   SCROTAL EXPLORATION N/A 07/15/2018   Procedure: SCROTUM DEBRIDEMENT;  Surgeon: Matilda Senior, MD;  Location: [facility] OR;  Service: Urology;  Laterality: N/A;   SHOULDER SURGERY     SKIN SPLIT GRAFT Right 09/19/2018  Procedure: Skin Graft Split Thickness;  Surgeon: Lowery Estefana RAMAN, DO;  [facility];  Service: Plastics;  Laterality: Right;   SKIN SPLIT GRAFT N/A 10/03/2018   Procedure: Split thickness skin graft to gluteal area with acell placement;  Surgeon: Lowery Estefana RAMAN, DO;  [facility];  Service: Plastics;  Laterality: N/A;  3 hours, please   VACUUM ASSISTED CLOSURE CHANGE N/A 07/12/2018   Procedure: ABDOMINAL VACUUM ASSISTED CLOSURE CHANGE and abdominal washout;  Surgeon: Sebastian Moles, MD;  Location: [facility] OR;  Service: General;  Laterality: N/A;   WOUND DEBRIDEMENT Left 07/23/2018   Procedure: DEBRIDEMENT LEFT BUTTOCK  WOUND;  Surgeon: Sebastian Moles, MD;  Location: [facility] OR;  Service: General;  Laterality:  Left;   WOUND EXPLORATION Left 07/10/2018   Procedure: WOUND EXPLORATION LEFT GROIN;  Surgeon: Signe Mitzie LABOR, MD;  Location: [facility];  Service: General;  Laterality: Left;   Past Medical History:  Diagnosis Date   Acute on chronic respiratory failure with hypoxia (HCC) 06/2018   trach removed 11-16-2018, on vent from jan until may 2020 - uses albuterol  prn   Anxiety    Bacteremia due to Pseudomonas 06/2018   Chronic osteomyelitis (HCC)    Chronic pain syndrome    Clostridium difficile colitis 10/30/2019   tx with abx    Depression    DVT (deep venous thrombosis) (HCC) 2020   right brachial post PICC line   History of blood transfusion 06/2018   History of Clostridioides difficile colitis    History of kidney stones    Hypertension    norvasc  d/c by pcp on 11/05/19   Multiple traumatic injuries    Penile pain 11/18/2019   Pneumonia 11/2009   2020 x 2   Walker as ambulation aid    Wheelchair bound    electric   Wound discharge    left hip wound with bloody/clear drainage change dressing q day surgilube with gauze, between legs wound using calcium  algenate pad bid   BP (!) 145/96   Pulse 89   Ht 6' 3 (1.905 m)   SpO2 96%   BMI 24.50 kg/m   Opioid Risk Score:   Fall Risk Score:  `1  Depression screen [facility] 2/9     01/17/2024   12:46 PM 11/20/2023   10:55 AM 11/02/2023   10:44 AM 06/12/2023   11:29 AM 06/01/2023    1:21 PM 05/04/2023   10:16 AM 05/01/2023   11:12 AM  Depression screen [facility] 2/9  Decreased Interest 1 0 0 1 2 2    Down, Depressed, Hopeless 1 0 0 1 2 2 1   PHQ - 2 Score 2 0 0 2 4 4 1   Altered sleeping  0 0  2 3   Tired, decreased energy  0 0  2 2   Change in appetite  0 0  2 3   Feeling bad or failure about yourself   0 0  2 2   Trouble concentrating  0 0  2 2   Moving slowly or fidgety/restless  0 0  1 2   Suicidal thoughts  0 0  1 0   PHQ-9 Score  0 0  16 18   Difficult doing work/chores  Not difficult at all Not difficult at all  Somewhat difficult  Very difficult     Review of Systems  Genitourinary:        Bladder spasms  Musculoskeletal:  Positive for gait problem.       Left  side pain  All other systems reviewed and are negative.      Objective:   Physical Exam  Awake, alert, appropriate, giving e more info and staying awake better today; scratching arms intermittently- accompanied by wife, in power w/c-  Writhing in pain due to bladder spasms intermittently.      Assessment & Plan:  Pt is a 58 yr old male with hx of Multitrauma- causing L femoral neck fx, degloving of L hip to going/scrotum, bladder neck trauma- got SPC, developed compartment syndrome -s/p surgery for that; also diverting colostomy, skin grafts, and s/p trach and PEG- PEG is out. 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 S/P screw removal and on IV ABX for L hip osteomyelitis. 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  Has R severe ulnar neuropathy as well. O2 3L full time.   Here for f/u on multitrauma and chronic L hip osteomyelitis with associated chronic pain.      Needs Botox for bladder asap. He' shaving horrible pain form bladder spasms which is common with his bladder issues from his accident-  he truly needs the BOTOX  -is literally writhing in pain due to bladder spasms in appt.   2. Needs psychiatry for  Olanzapine  and Alprazolam- explained cannot refill, but he truly needs SOMEONE to prescribe- only a psychiatrist would feel it's appropriate.   3.  Le'ts go back to Seroquel  for for hallucinations- since  worked better than Olanzapine .  50 mg in AM and 100 mg at bedtime for sleep and hallucinations   4. When is 2nd joystick to be finished for pt- as an addition to his current w/c- to use when pt is unable to control  his w/c appropriately.   5. Needs handicapped placard-  is due this month- given for 5 years.   6. Duragesic  75 mcg q2 days- will refill x 2 refills  7. Has Narcan  nasal spray at home- is new one.   8. MSIR Morphine  short acting- 15 mg q3 hours- will refill x2.  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    9. STOP OLANZAPINE - Replace with Quetiapine   10. F/U in 6 weeks- double visit- complex workers cmp    I spent a total of  35  minutes on total care today- >50% coordination of care- due to d/w pt about pain med options- and d/w wife about narcan , and handicapped placard- also visualizing his bladder spasms Sx's- and need for joystick

## 2024-01-17 NOTE — Patient Instructions (Addendum)
 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 S/P screw removal and on IV ABX for L hip osteomyelitis. 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  Has R severe ulnar neuropathy as well. O2 3L full time.   Here for f/u on multitrauma and chronic L hip osteomyelitis with associated chronic pain.      Needs Botox for bladder asap. He' shaving horrible pain form bladder spasms which is common with his bladder issues from his accident-  he truly needs the BOTOX--is literally writhing in pain due to bladder spasms in appt.    2. Needs psychiatry for  Olanzapine  and Alprazolam- explained cannot refill, but he truly needs SOMEONE to prescribe- only a psychiatrist would feel it's appropriate.   3.  Le'ts go back to Seroquel  for for hallucinations- since worked better than Olanzapine .  50 mg in AM and 100 mg at bedtime for sleep and hallucinations   4. When is 2nd joystick to be finished for pt- as an addition to his current w/c- to use when pt is unable to control  his w/c appropriately.   5. Needs handicapped placard-  is due this month- given for 5 years.   6. Duragesic  75 mcg q2 days- will refill x 2 refills  7. Has Narcan  nasal spray at home- is new one.   8. MSIR Morphine  short acting- 15 mg q3  hours- will refill x2.  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    9. STOP OLANZAPINE - Replace with Quetiapine   10. F/U in 6 weeks- double visit- complex workers cmp

## 2024-01-18 ENCOUNTER — Telehealth: Payer: Self-pay

## 2024-01-18 MED ORDER — MORPHINE SULFATE 15 MG PO TABS: 15.0000 mg | ORAL_TABLET | ORAL | 0 refills | Status: DC | PRN

## 2024-01-18 NOTE — Telephone Encounter (Signed)
 [facility] [address] in [place] has requested for the Morphine  15 MG to be re-submitted. Please put Dx codes on the script / scripts.   Thank you.

## 2024-01-18 NOTE — Telephone Encounter (Signed)
 Patient notified

## 2024-01-19 ENCOUNTER — Other Ambulatory Visit: Payer: Self-pay | Admitting: Physical Medicine and Rehabilitation

## 2024-01-19 ENCOUNTER — Other Ambulatory Visit: Payer: Self-pay | Admitting: Family Medicine

## 2024-01-24 ENCOUNTER — Telehealth: Payer: Self-pay | Admitting: Family Medicine

## 2024-01-24 ENCOUNTER — Telehealth: Payer: Self-pay

## 2024-01-24 ENCOUNTER — Other Ambulatory Visit: Payer: Self-pay | Admitting: Physical Medicine and Rehabilitation

## 2024-01-24 NOTE — Telephone Encounter (Signed)
 Please resubmit Mr. Speros Morphine  with diagnosis code on the script.

## 2024-01-24 NOTE — Telephone Encounter (Signed)
 [facility] faxed document Home Health Certificate (Order [place] [id number]), to be filled out by provider. Patient requested to send it back via Fax within ASAP. Document is located in providers tray at front office.Please advise at [phone].

## 2024-01-25 DIAGNOSIS — Z433 Encounter for attention to colostomy: Secondary | ICD-10-CM

## 2024-01-25 DIAGNOSIS — G8220 Paraplegia, unspecified: Secondary | ICD-10-CM | POA: Diagnosis not present

## 2024-01-25 DIAGNOSIS — E43 Unspecified severe protein-calorie malnutrition: Secondary | ICD-10-CM

## 2024-01-25 DIAGNOSIS — Z435 Encounter for attention to cystostomy: Secondary | ICD-10-CM

## 2024-01-25 DIAGNOSIS — M86652 Other chronic osteomyelitis, left thigh: Secondary | ICD-10-CM | POA: Diagnosis not present

## 2024-01-25 DIAGNOSIS — I700 Atherosclerosis of aorta: Secondary | ICD-10-CM | POA: Diagnosis not present

## 2024-01-25 DIAGNOSIS — J9611 Chronic respiratory failure with hypoxia: Secondary | ICD-10-CM

## 2024-01-25 DIAGNOSIS — Z87440 Personal history of urinary (tract) infections: Secondary | ICD-10-CM

## 2024-01-25 DIAGNOSIS — G894 Chronic pain syndrome: Secondary | ICD-10-CM

## 2024-01-25 DIAGNOSIS — F32A Depression, unspecified: Secondary | ICD-10-CM

## 2024-01-25 DIAGNOSIS — I10 Essential (primary) hypertension: Secondary | ICD-10-CM | POA: Diagnosis not present

## 2024-01-25 DIAGNOSIS — G9340 Encephalopathy, unspecified: Secondary | ICD-10-CM

## 2024-01-25 NOTE — Telephone Encounter (Signed)
Placed to be reviewed in PCP office

## 2024-01-29 NOTE — Telephone Encounter (Signed)
 Form faxed to [phone]

## 2024-02-07 ENCOUNTER — Telehealth: Payer: Self-pay | Admitting: *Deleted

## 2024-02-07 NOTE — Telephone Encounter (Signed)
 Caller/Agency: [facility]  Callback Number: [phone]  Service Requested: Physical Therapy  Frequency: 1 time a week 7 weeks  Any new concerns about the patient? No   Ok to give VO?

## 2024-02-08 NOTE — Telephone Encounter (Signed)
 Ok with me. Please place any necessary orders.

## 2024-02-08 NOTE — Telephone Encounter (Signed)
 [facility] / [facility] Called # [phone] LVM with VO

## 2024-02-11 ENCOUNTER — Other Ambulatory Visit: Payer: Self-pay | Admitting: Physical Medicine and Rehabilitation

## 2024-02-25 ENCOUNTER — Other Ambulatory Visit: Payer: Self-pay | Admitting: Nurse Practitioner

## 2024-02-25 ENCOUNTER — Other Ambulatory Visit: Payer: Self-pay | Admitting: Internal Medicine

## 2024-02-25 ENCOUNTER — Other Ambulatory Visit: Payer: Self-pay | Admitting: Physical Medicine and Rehabilitation

## 2024-02-25 DIAGNOSIS — J439 Emphysema, unspecified: Secondary | ICD-10-CM

## 2024-03-06 ENCOUNTER — Encounter: Payer: Self-pay | Admitting: Physical Medicine and Rehabilitation

## 2024-03-06 ENCOUNTER — Encounter
Payer: PRIVATE HEALTH INSURANCE | Attending: Physical Medicine and Rehabilitation | Admitting: Physical Medicine and Rehabilitation

## 2024-03-06 VITALS — Ht 75.0 in

## 2024-03-06 DIAGNOSIS — Z993 Dependence on wheelchair: Secondary | ICD-10-CM | POA: Diagnosis present

## 2024-03-06 DIAGNOSIS — F518 Other sleep disorders not due to a substance or known physiological condition: Secondary | ICD-10-CM | POA: Insufficient documentation

## 2024-03-06 DIAGNOSIS — G894 Chronic pain syndrome: Secondary | ICD-10-CM | POA: Insufficient documentation

## 2024-03-06 DIAGNOSIS — R413 Other amnesia: Secondary | ICD-10-CM | POA: Diagnosis present

## 2024-03-06 DIAGNOSIS — N3289 Other specified disorders of bladder: Secondary | ICD-10-CM | POA: Diagnosis not present

## 2024-03-06 DIAGNOSIS — M8668 Other chronic osteomyelitis, other site: Secondary | ICD-10-CM | POA: Insufficient documentation

## 2024-03-06 MED ORDER — FENTANYL 75 MCG/HR TD PT72: 1.0000 | MEDICATED_PATCH | TRANSDERMAL | 0 refills | Status: DC

## 2024-03-06 MED ORDER — PROMETHAZINE HCL 25 MG PO TABS: 25.0000 mg | ORAL_TABLET | Freq: Four times a day (QID) | ORAL | 5 refills | Status: DC | PRN

## 2024-03-06 MED ORDER — PAROXETINE HCL 40 MG PO TABS: ORAL_TABLET | ORAL | 1 refills | Status: AC

## 2024-03-06 MED ORDER — MORPHINE SULFATE 15 MG PO TABS: 15.0000 mg | ORAL_TABLET | ORAL | 0 refills | Status: DC | PRN

## 2024-03-06 NOTE — Progress Notes (Addendum)
 Subjective:    Patient ID: Christopher [name], male    DOB: [birth year], 58 y.o.   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 S/P screw removal and on IV ABX for L hip osteomyelitis. 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  Has R severe ulnar neuropathy as well. O2 3L full time.   Here for f/u on multitrauma and chronic L hip osteomyelitis with associated chronic pain.    Hearing music real bad again this AM.   Of note, teeth weren't great before, but actually started losing teeth when needed PEG/feeding tube- due ot such severe malnutrition.    Is nauseated and not eating for last 10-14 days. Since stopped Olanzapine , has less appetite.  And just not eating  Taking phenergan   ~ 2-3x/day.  Not doing PT- and ill acting, because tired.  Said if he doesn't eat, will need PEG tube back.   Mostly dry heaving/gagging, but no vomiting- but not eating much at all- is drinking though.    Started weaning off Lorazepam  due to worker's comp- didn't have refills.     Seroquel  is calming him down, but really sleepy.    Got joystick for back of w/c.  Says stays up to 11pm so can sleep all night and then jerking ot wake up.   Has been back in more pain- even back  on Morphine - still been in more pain.  9/10 vs 7-8/10- as well and really out of it a lot.    Bladder Botox 1-/17 [facility]-  Bladder spasms are worse- pain 50/50 of L hip and bladder spasms- also having some back pain.   Has been refusing PT because hurting, but had been doing well with it.       Pain Inventory Average Pain 9 Pain Right Now 9 My pain is constant, sharp, burning, dull, stabbing, tingling, and aching  In the last 24 hours, has pain interfered with the following? General activity 10 Relation with others 10 Enjoyment of life 10 What TIME of day is your pain at its worst? morning , daytime, evening, and night Sleep (in general) Poor  Pain is worse with: walking, bending, sitting, inactivity, standing, unsure, and some activites Pain improves with: medication Relief from Meds: 2  Family History  Problem Relation Age of Onset   Breast cancer Mother        with mets to the bones   Social History   Socioeconomic History   Marital status: Married    Spouse name: Not on file   Number of children: Not on file   Years of education: Not on file   Highest education level: Not on file  Occupational History   Occupation: Disable  Tobacco Use  Smoking status: Former    Current packs/day: 0.00    Average packs/day: 1 pack/day for 20.0 years (20.0 ttl pk-yrs)    Types: Cigarettes    Start date: 07/10/1998    Quit date: 07/10/2018    Years since quitting: 5.6   Smokeless tobacco: Never  Vaping Use   Vaping status: Never Used  Substance and Sexual Activity   Alcohol use: Never   Drug use: Yes    Types: Oxycodone , Fentanyl     Comment: Fentanyl  patch/oxycodone  since 06/2018   Sexual activity: Yes  Other Topics Concern   Not on file  Social History Narrative   ** Merged History Encounter **       Social Drivers of Health   Financial Resource Strain: Low Risk  (03/01/2019)   Overall Financial Resource Strain (CARDIA)    Difficulty of Paying Living  Expenses: Not very hard  Food Insecurity: No Food Insecurity (10/15/2023)   Hunger Vital Sign    Worried About Running Out of Food in the Last Year: Never true    Ran Out of Food in the Last Year: Never true  Transportation Needs: No Transportation Needs (10/15/2023)   PRAPARE - Administrator, Civil Service (Medical): No    Lack of Transportation (Non-Medical): No  Physical Activity: Inactive (03/01/2019)   Exercise Vital Sign    Days of Exercise per Week: 0 days    Minutes of Exercise per Session: 0 min  Stress: Stress Concern Present (03/01/2019)   [employer] of Occupational Health - Occupational Stress Questionnaire    Feeling of Stress : Rather much  Social Connections: Not on file   Past Surgical History:  Procedure Laterality Date   APPLICATION OF A-CELL OF BACK N/A 08/06/2018   Procedure: Application Of A-Cell Of Back;  Surgeon: Lowery Estefana RAMAN, DO;  [facility];  Service: Plastics;  Laterality: N/A;   APPLICATION OF A-CELL OF EXTREMITY Left 08/06/2018   Procedure: Application Of A-Cell Of Extremity;  Surgeon: Lowery Estefana RAMAN, DO;  [facility];  Service: Plastics;  Laterality: Left;   APPLICATION OF A-CELL OF EXTREMITY Left 09/18/2019   Procedure: APPLICATION OF A-CELL OF EXTREMITY;  Surgeon: Lowery Estefana RAMAN, DO;  [facility];  Service: Plastics;  Laterality: Left;   APPLICATION OF WOUND VAC  07/12/2018   Procedure: Application Of Wound Vac to the Left Thigh and Scrotum.;  Surgeon: Kendal Franky SQUIBB, MD;  [facility];  Service: Orthopedics;;   APPLICATION OF WOUND VAC  07/10/2018   Procedure: Application Of Wound Vac;  Surgeon: Signe Mitzie LABOR, MD;  [facility] OR;  Service: General;;   BIOPSY  11/02/2022   Procedure: BIOPSY;  Surgeon: Wilhelmenia Aloha Raddle., MD;  Location: [facility];  Service: Gastroenterology;;   COLON SURGERY  2020   colostomy   COLOSTOMY N/A 07/23/2018   Procedure: COLOSTOMY;  Surgeon: Sebastian Moles, MD;   [facility] OR;  Service: General;  Laterality: N/A;   CYSTOSCOPY W/ URETERAL STENT PLACEMENT N/A 07/15/2018   Procedure: RETROGRADE URETHROGRAM;  Surgeon: Matilda Senior, MD;  Location: [facility] OR;  Service: Urology;  Laterality: N/A;   CYSTOSCOPY WITH LITHOLAPAXY N/A 05/06/2019   Procedure: CYSTOSCOPY BASKET BLADDER STONE EXTRACTION;  Surgeon: Sherrilee Belvie CROME, MD;  Location: [facility];  Service: Urology;  Laterality: N/A;  30 MINS   CYSTOSTOMY N/A 05/06/2019   Procedure: REPLACEMENT OF SUPRAPUBIC CATHETER;  Surgeon: Sherrilee Belvie CROME, MD;  Location: [facility];  Service: Urology;  Laterality: N/A;   DEBRIDEMENT AND CLOSURE WOUND Left 03/04/2019   Procedure: Excision of hip wound with placement of Acell;  Surgeon: Lowery Estefana RAMAN, DO;  [facility];  Service: Plastics;  Laterality: Left;   ESOPHAGOGASTRODUODENOSCOPY N/A 08/14/2018   Procedure: ESOPHAGOGASTRODUODENOSCOPY (EGD);  Surgeon: Sebastian Moles, MD;  Location: [facility] ENDOSCOPY;  Service: General;  Laterality: N/A;  bedside   ESOPHAGOGASTRODUODENOSCOPY (EGD) WITH PROPOFOL  N/A 11/02/2022   Procedure: ESOPHAGOGASTRODUODENOSCOPY (EGD) WITH PROPOFOL ;  Surgeon: Wilhelmenia Aloha Raddle., MD;  Location: [facility];  Service: Gastroenterology;  Laterality: N/A;   FACIAL RECONSTRUCTION SURGERY     X 2--once as a teenager and second time in his 60's   HARDWARE REMOVAL Left 03/04/2019   Procedure: Left Hip Hardware Removal;  Surgeon: Kendal Franky SQUIBB, MD;  [facility];  Service: Orthopedics;  Laterality: Left;   HEMOSTASIS CLIP PLACEMENT  11/02/2022   Procedure: HEMOSTASIS CLIP PLACEMENT;  Surgeon: Wilhelmenia Aloha Raddle., MD;  Location: [facility];  Service: Gastroenterology;;   HIP PINNING,CANNULATED Left 07/12/2018   Procedure: CANNULATED HIP PINNING;  Surgeon: Kendal Franky SQUIBB, MD;  [facility];  Service: Orthopedics;  Laterality: Left;   HIP SURGERY     HOLMIUM LASER APPLICATION Right 07/15/2019    Procedure: HOLMIUM LASER APPLICATION;  Surgeon: Sherrilee Belvie CROME, MD;  Location: [facility];  Service: Urology;  Laterality: Right;   HOT HEMOSTASIS N/A 11/02/2022   Procedure: HOT HEMOSTASIS (ARGON PLASMA COAGULATION/BICAP);  Surgeon: Wilhelmenia Aloha Raddle., MD;  Location: [facility];  Service: Gastroenterology;  Laterality: N/A;   I & D EXTREMITY Left 07/25/2018   Procedure: Debridement of buttock, scrotum and left leg, placement of acell and vac;  Surgeon: Lowery Estefana RAMAN, DO;  [facility];  Service: Plastics;  Laterality: Left;   I & D EXTREMITY N/A 08/06/2018   Procedure: Debridement of buttock, scrotum and left leg;  Surgeon: Lowery Estefana RAMAN, DO;  [facility];  Service: Plastics;  Laterality: N/A;   I & D EXTREMITY N/A 08/13/2018   Procedure: Debridement of buttock, scrotum and left leg, placement of acell and vac;  Surgeon: Lowery Estefana RAMAN, DO;  [facility];  Service: Plastics;  Laterality: N/A;  90 min, please   INCISION AND DRAINAGE HIP Left 09/18/2019   Procedure: IRRIGATION AND DEBRIDEMENT HIP/ PELVIS WITH WOUND VAC PLACEMENT;  Surgeon: Kendal Franky SQUIBB, MD;  [facility];  Service: Orthopedics;  Laterality: Left;   INCISION AND DRAINAGE OF WOUND N/A 07/18/2018   Procedure: Debridement of left leg, buttocks and scrotal wound with placement of acell and Flexiseal;  Surgeon: Lowery Estefana RAMAN, DO;  [facility];  Service: Plastics;  Laterality: N/A;   INCISION AND DRAINAGE OF WOUND Left 08/29/2018   Procedure: Debridement of buttock, scrotum and left leg, placement of acell and vac;  Surgeon: Lowery Estefana RAMAN, DO;  [facility];  Service: Plastics;  Laterality: Left;  75 min, please   INCISION AND DRAINAGE OF WOUND Bilateral 10/23/2018   Procedure: DEBRIDEMENT OF BUTTOCK,SCROTUM, AND LEG WOUNDS WITH PLACEMENT OF ACELL- BILATERAL 90 MIN;  Surgeon: Lowery Estefana RAMAN, DO;  [facility];  Service: Plastics;  Laterality: Bilateral;   IR  ANGIOGRAM PELVIS SELECTIVE OR SUPRASELECTIVE  07/10/2018   IR ANGIOGRAM PELVIS SELECTIVE OR SUPRASELECTIVE  07/10/2018   IR ANGIOGRAM SELECTIVE EACH ADDITIONAL VESSEL  07/10/2018   IR EMBO ART  VEN HEMORR LYMPH EXTRAV  INC GUIDE ROADMAPPING  07/10/2018   IR GASTROSTOMY TUBE MOD SED  01/21/2021   IR NEPHROSTOMY PLACEMENT LEFT  04/05/2019   IR NEPHROSTOMY PLACEMENT RIGHT  05/31/2019   IR US  GUIDE BX ASP/DRAIN  07/10/2018   IR US  GUIDE VASC ACCESS RIGHT  07/10/2018   IR VENO/EXT/UNI LEFT  07/10/2018   IRRIGATION AND DEBRIDEMENT OF WOUND WITH SPLIT THICKNESS SKIN GRAFT Left 09/19/2018   Procedure: Debridement of gluteal wound with placement of acell to left leg.;  Surgeon: Lowery Estefana RAMAN, DO;  [facility];  Service: Plastics;  Laterality: Left;  2.5 hours, please   LAPAROTOMY N/A 07/12/2018   Procedure: EXPLORATORY LAPAROTOMY;  Surgeon: Sebastian Moles, MD;  Location: [facility] OR;  Service: General;  Laterality: N/A;   LAPAROTOMY N/A 07/15/2018   Procedure: WOUND EXPLORATION; CLOSURE OF ABDOMEN;  Surgeon: Sebastian Moles, MD;  Location: [facility] OR;  Service: General;  Laterality: N/A;   LAPAROTOMY  07/10/2018   Procedure: Exploratory Laparotomy;  Surgeon: Signe Mitzie LABOR, MD;  Location: [facility] OR;  Service: General;;   MASS EXCISION Left 09/18/2019   Procedure: EXCISION UPPER LEFT INNER THIGH WOUND;  Surgeon: Lowery Estefana RAMAN, DO;  [facility];  Service: Plastics;  Laterality: Left;   NEPHROLITHOTOMY Right 07/15/2019   Procedure: NEPHROLITHOTOMY PERCUTANEOUS;  Surgeon: Sherrilee Belvie CROME, MD;  Location: [place];  Service: Urology;  Laterality: Right;  90 MINS   PEG PLACEMENT N/A 08/14/2018   Procedure: PERCUTANEOUS ENDOSCOPIC GASTROSTOMY (PEG) PLACEMENT;  Surgeon: Sebastian Moles, MD;  Location: [facility] ENDOSCOPY;  Service: General;  Laterality: N/A;   PERCUTANEOUS TRACHEOSTOMY N/A 08/02/2018   Procedure: PERCUTANEOUS TRACHEOSTOMY;  Surgeon: Sebastian Moles, MD;  Location: [facility] OR;  Service:  General;  Laterality: N/A;   RADIOLOGY WITH ANESTHESIA N/A 07/10/2018   Procedure: IR WITH ANESTHESIA;  Surgeon: Adele Rush, MD;  Location: [facility] OR;  Service: Radiology;  Laterality: N/A;   RADIOLOGY WITH ANESTHESIA Right 07/10/2018   Procedure: Ir With Anesthesia;  Surgeon: Adele Rush, MD;  Location: [facility] OR;  Service: Radiology;  Laterality: Right;   SAVORY DILATION N/A 11/02/2022   Procedure: SAVORY DILATION;  Surgeon: Wilhelmenia Aloha Raddle., MD;  Location: [facility];  Service: Gastroenterology;  Laterality: N/A;   SCROTAL EXPLORATION N/A 07/15/2018   Procedure: SCROTUM DEBRIDEMENT;  Surgeon: Matilda Senior, MD;  Location: [facility] OR;  Service: Urology;  Laterality: N/A;   SHOULDER SURGERY     SKIN SPLIT GRAFT Right 09/19/2018   Procedure: Skin Graft Split Thickness;  Surgeon: Lowery Estefana RAMAN, DO;  [facility];  Service: Plastics;  Laterality: Right;   SKIN SPLIT GRAFT N/A 10/03/2018   Procedure: Split thickness skin graft to gluteal area with acell placement;  Surgeon: Lowery Estefana RAMAN, DO;  [facility];  Service: Plastics;  Laterality: N/A;  3 hours, please   VACUUM ASSISTED CLOSURE CHANGE N/A 07/12/2018   Procedure: ABDOMINAL VACUUM ASSISTED CLOSURE CHANGE and abdominal washout;  Surgeon: Sebastian Moles, MD;  Location: [facility] OR;  Service: General;  Laterality: N/A;   WOUND DEBRIDEMENT Left 07/23/2018   Procedure: DEBRIDEMENT LEFT BUTTOCK  WOUND;  Surgeon: Sebastian Moles, MD;  Location: [facility] OR;  Service: General;  Laterality: Left;   WOUND EXPLORATION Left 07/10/2018   Procedure: WOUND EXPLORATION LEFT GROIN;  Surgeon: Signe Mitzie LABOR, MD;  Location: [facility] OR;  Service: General;  Laterality: Left;   Past Surgical History:  Procedure Laterality Date   APPLICATION OF A-CELL OF BACK N/A 08/06/2018   Procedure: Application Of A-Cell Of Back;  Surgeon: Lowery Estefana RAMAN, DO;  [facility];  Service:  Plastics;  Laterality: N/A;   APPLICATION OF A-CELL OF EXTREMITY Left 08/06/2018    Procedure: Application Of A-Cell Of Extremity;  Surgeon: Lowery Estefana RAMAN, DO;  [facility];  Service: Plastics;  Laterality: Left;   APPLICATION OF A-CELL OF EXTREMITY Left 09/18/2019   Procedure: APPLICATION OF A-CELL OF EXTREMITY;  Surgeon: Lowery Estefana RAMAN, DO;  [facility];  Service: Plastics;  Laterality: Left;   APPLICATION OF WOUND VAC  07/12/2018   Procedure: Application Of Wound Vac to the Left Thigh and Scrotum.;  Surgeon: Kendal Franky SQUIBB, MD;  [facility];  Service: Orthopedics;;   APPLICATION OF WOUND VAC  07/10/2018   Procedure: Application Of Wound Vac;  Surgeon: Signe Mitzie LABOR, MD;  Location: [facility] OR;  Service: General;;   BIOPSY  11/02/2022   Procedure: BIOPSY;  Surgeon: Wilhelmenia Aloha Raddle., MD;  Location: [facility];  Service: Gastroenterology;;   COLON SURGERY  2020   colostomy   COLOSTOMY N/A 07/23/2018   Procedure: COLOSTOMY;  Surgeon: Sebastian Moles, MD;  Location: [facility] OR;  Service: General;  Laterality: N/A;   CYSTOSCOPY W/ URETERAL STENT PLACEMENT N/A 07/15/2018   Procedure: RETROGRADE URETHROGRAM;  Surgeon: Matilda Senior, MD;  Location: [facility] OR;  Service: Urology;  Laterality: N/A;   CYSTOSCOPY WITH LITHOLAPAXY N/A 05/06/2019   Procedure: CYSTOSCOPY BASKET BLADDER STONE EXTRACTION;  Surgeon: Sherrilee Belvie CROME, MD;  Location: [facility];  Service: Urology;  Laterality: N/A;  30 MINS   CYSTOSTOMY N/A 05/06/2019   Procedure: REPLACEMENT OF SUPRAPUBIC CATHETER;  Surgeon: Sherrilee Belvie CROME, MD;  Location: [facility];  Service: Urology;  Laterality: N/A;   DEBRIDEMENT AND CLOSURE WOUND Left 03/04/2019   Procedure: Excision of hip wound with placement of Acell;  Surgeon: Lowery Estefana RAMAN, DO;  [facility];  Service: Plastics;  Laterality: Left;   ESOPHAGOGASTRODUODENOSCOPY N/A 08/14/2018   Procedure: ESOPHAGOGASTRODUODENOSCOPY (EGD);  Surgeon: Sebastian Moles, MD;  Location: [facility] ENDOSCOPY;  Service: General;   Laterality: N/A;  bedside   ESOPHAGOGASTRODUODENOSCOPY (EGD) WITH PROPOFOL  N/A 11/02/2022   Procedure: ESOPHAGOGASTRODUODENOSCOPY (EGD) WITH PROPOFOL ;  Surgeon: Wilhelmenia Aloha Raddle., MD;  Location: [facility];  Service: Gastroenterology;  Laterality: N/A;   FACIAL RECONSTRUCTION SURGERY     X 2--once as a teenager and second time in his 37's   HARDWARE REMOVAL Left 03/04/2019   Procedure: Left Hip Hardware Removal;  Surgeon: Kendal Franky SQUIBB, MD;  [facility];  Service: Orthopedics;  Laterality: Left;   HEMOSTASIS CLIP PLACEMENT  11/02/2022   Procedure: HEMOSTASIS CLIP PLACEMENT;  Surgeon: Wilhelmenia Aloha Raddle., MD;  Location: [facility];  Service: Gastroenterology;;   HIP PINNING,CANNULATED Left 07/12/2018   Procedure: CANNULATED HIP PINNING;  Surgeon: Kendal Franky SQUIBB, MD;  [facility];  Service: Orthopedics;  Laterality: Left;   HIP SURGERY     HOLMIUM LASER APPLICATION Right 07/15/2019   Procedure: HOLMIUM LASER APPLICATION;  Surgeon: Sherrilee Belvie CROME, MD;  Location: [facility];  Service: Urology;  Laterality: Right;   HOT HEMOSTASIS N/A 11/02/2022   Procedure: HOT HEMOSTASIS (ARGON PLASMA COAGULATION/BICAP);  Surgeon: Wilhelmenia Aloha Raddle., MD;  Location: [facility];  Service: Gastroenterology;  Laterality: N/A;   I & D EXTREMITY Left 07/25/2018   Procedure: Debridement of buttock, scrotum and left leg, placement of acell and vac;  Surgeon: Lowery Estefana RAMAN, DO;  [facility];  Service: Plastics;  Laterality: Left;   I & D EXTREMITY N/A 08/06/2018   Procedure: Debridement of buttock,  scrotum and left leg;  Surgeon: Lowery Estefana RAMAN, DO;  [facility];  Service: Plastics;  Laterality: N/A;   I & D EXTREMITY N/A 08/13/2018   Procedure: Debridement of buttock, scrotum and left leg, placement of acell and vac;  Surgeon: Lowery Estefana RAMAN, DO;  [facility];  Service: Plastics;  Laterality: N/A;  90 min, please   INCISION AND DRAINAGE HIP Left  09/18/2019   Procedure: IRRIGATION AND DEBRIDEMENT HIP/ PELVIS WITH WOUND VAC PLACEMENT;  Surgeon: Kendal Franky SQUIBB, MD;  [facility];  Service: Orthopedics;  Laterality: Left;   INCISION AND DRAINAGE OF WOUND N/A 07/18/2018   Procedure: Debridement of left leg, buttocks and scrotal wound with placement of acell and Flexiseal;  Surgeon: Lowery Estefana RAMAN, DO;  [facility];  Service: Plastics;  Laterality: N/A;   INCISION AND DRAINAGE OF WOUND Left 08/29/2018   Procedure: Debridement of buttock, scrotum and left leg, placement of acell and vac;  Surgeon: Lowery Estefana RAMAN, DO;  [facility];  Service: Plastics;  Laterality: Left;  75 min, please   INCISION AND DRAINAGE OF WOUND Bilateral 10/23/2018   Procedure: DEBRIDEMENT OF BUTTOCK,SCROTUM, AND LEG WOUNDS WITH PLACEMENT OF ACELL- BILATERAL 90 MIN;  Surgeon: Lowery Estefana RAMAN, DO;  [facility];  Service: Plastics;  Laterality: Bilateral;   IR ANGIOGRAM PELVIS SELECTIVE OR SUPRASELECTIVE  07/10/2018   IR ANGIOGRAM PELVIS SELECTIVE OR SUPRASELECTIVE  07/10/2018   IR ANGIOGRAM SELECTIVE EACH ADDITIONAL VESSEL  07/10/2018   IR EMBO ART  VEN HEMORR LYMPH EXTRAV  INC GUIDE ROADMAPPING  07/10/2018   IR GASTROSTOMY TUBE MOD SED  01/21/2021   IR NEPHROSTOMY PLACEMENT LEFT  04/05/2019   IR NEPHROSTOMY PLACEMENT RIGHT  05/31/2019   IR US  GUIDE BX ASP/DRAIN  07/10/2018   IR US  GUIDE VASC ACCESS RIGHT  07/10/2018   IR VENO/EXT/UNI LEFT  07/10/2018   IRRIGATION AND DEBRIDEMENT OF WOUND WITH SPLIT THICKNESS SKIN GRAFT Left 09/19/2018   Procedure: Debridement of gluteal wound with placement of acell to left leg.;  Surgeon: Lowery Estefana RAMAN, DO;  [facility];  Service: Plastics;  Laterality: Left;  2.5 hours, please   LAPAROTOMY N/A 07/12/2018   Procedure: EXPLORATORY LAPAROTOMY;  Surgeon: Sebastian Moles, MD;  Location: [facility] OR;  Service: General;  Laterality: N/A;   LAPAROTOMY N/A 07/15/2018   Procedure: WOUND EXPLORATION; CLOSURE OF ABDOMEN;   Surgeon: Sebastian Moles, MD;  Location: [facility] OR;  Service: General;  Laterality: N/A;   LAPAROTOMY  07/10/2018   Procedure: Exploratory Laparotomy;  Surgeon: Signe Mitzie LABOR, MD;  Location: [facility] OR;  Service: General;;   MASS EXCISION Left 09/18/2019   Procedure: EXCISION UPPER LEFT INNER THIGH WOUND;  Surgeon: Lowery Estefana RAMAN, DO;  [facility];  Service: Plastics;  Laterality: Left;   NEPHROLITHOTOMY Right 07/15/2019   Procedure: NEPHROLITHOTOMY PERCUTANEOUS;  Surgeon: Sherrilee Belvie CROME, MD;  Location: [facility];  Service: Urology;  Laterality: Right;  90 MINS   PEG PLACEMENT N/A 08/14/2018   Procedure: PERCUTANEOUS ENDOSCOPIC GASTROSTOMY (PEG) PLACEMENT;  Surgeon: Sebastian Moles, MD;  Location: [facility] ENDOSCOPY;  Service: General;  Laterality: N/A;   PERCUTANEOUS TRACHEOSTOMY N/A 08/02/2018   Procedure: PERCUTANEOUS TRACHEOSTOMY;  Surgeon: Sebastian Moles, MD;  Location: [facility] OR;  Service: General;  Laterality: N/A;   RADIOLOGY WITH ANESTHESIA N/A 07/10/2018   Procedure: IR WITH ANESTHESIA;  Surgeon: Adele Rush, MD;  Location: [facility] OR;  Service: Radiology;  Laterality: N/A;   RADIOLOGY WITH ANESTHESIA Right  07/10/2018   Procedure: Ir With Anesthesia;  Surgeon: Adele Rush, MD;  Location: [facility] OR;  Service: Radiology;  Laterality: Right;   SAVORY DILATION N/A 11/02/2022   Procedure: SAVORY DILATION;  Surgeon: Wilhelmenia Aloha Raddle., MD;  Location: [facility];  Service: Gastroenterology;  Laterality: N/A;   SCROTAL EXPLORATION N/A 07/15/2018   Procedure: SCROTUM DEBRIDEMENT;  Surgeon: Matilda Senior, MD;  Location: [facility] OR;  Service: Urology;  Laterality: N/A;   SHOULDER SURGERY     SKIN SPLIT GRAFT Right 09/19/2018   Procedure: Skin Graft Split Thickness;  Surgeon: Lowery Estefana RAMAN, DO;  [facility];  Service: Plastics;  Laterality: Right;   SKIN SPLIT GRAFT N/A 10/03/2018   Procedure: Split thickness skin graft to gluteal area with acell placement;  Surgeon: Lowery Estefana RAMAN, DO;  [facility];  Service: Plastics;  Laterality: N/A;  3 hours, please   VACUUM ASSISTED CLOSURE CHANGE N/A 07/12/2018   Procedure: ABDOMINAL VACUUM ASSISTED CLOSURE CHANGE and abdominal washout;  Surgeon: Sebastian Moles, MD;  Location: [facility] OR;  Service: General;  Laterality: N/A;   WOUND DEBRIDEMENT Left 07/23/2018   Procedure: DEBRIDEMENT LEFT BUTTOCK  WOUND;  Surgeon: Sebastian Moles, MD;  Location: [facility] OR;  Service: General;  Laterality: Left;   WOUND EXPLORATION Left 07/10/2018   Procedure: WOUND EXPLORATION LEFT GROIN;  Surgeon: Signe Mitzie LABOR, MD;  [facility];  Service: General;  Laterality: Left;   Past Medical History:  Diagnosis Date   Acute on chronic respiratory failure with hypoxia (HCC) 06/2018   trach removed 11-16-2018, on vent from jan until may 2020 - uses albuterol  prn   Anxiety    Bacteremia due to Pseudomonas 06/2018   Chronic osteomyelitis (HCC)    Chronic pain syndrome    Clostridium difficile colitis 10/30/2019   tx with abx    Depression    DVT (deep venous thrombosis) (HCC) 2020   right brachial post PICC line   History of blood transfusion 06/2018   History of Clostridioides difficile colitis    History of kidney stones    Hypertension    norvasc  d/c by pcp on 11/05/19   Multiple traumatic injuries    Penile pain 11/18/2019   Pneumonia 11/2009   2020 x 2   Walker as ambulation aid    Wheelchair bound    electric   Wound discharge    left hip wound with bloody/clear drainage change dressing q day surgilube with gauze, between legs wound using calcium  algenate pad bid   Ht 6' 3 (1.905 m)   BMI 24.50 kg/m   Opioid Risk Score:   Fall Risk Score:  `1  Depression screen [facility] 2/9     01/17/2024   12:46 PM 11/20/2023   10:55 AM 11/02/2023   10:44 AM 06/12/2023   11:29 AM 06/01/2023    1:21 PM 05/04/2023   10:16 AM 05/01/2023   11:12 AM  Depression screen PHQ 2/9  Decreased Interest 1 0 0 1 2 2    Down, Depressed, Hopeless 1 0 0 1 2  2 1   PHQ - 2 Score 2 0 0 2 4 4 1   Altered sleeping  0 0  2 3   Tired, decreased energy  0 0  2 2   Change in appetite  0 0  2 3   Feeling bad or failure about yourself   0 0  2 2   Trouble concentrating  0 0  2 2   Moving slowly or fidgety/restless  0 0  1 2   Suicidal thoughts  0 0  1 0   PHQ-9 Score  0 0  16 18   Difficult doing work/chores  Not difficult at all Not difficult at all  Somewhat difficult Very difficult     Review of Systems  Musculoskeletal:  Positive for back pain and gait problem.       LEFT HIP PAIN  All other systems reviewed and are negative.      Objective:   Physical Exam  Awake, but sleepy- keeps having hypnagogic jerks- >10x in appointment- large and sometimes with eyes open.  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 S/P screw removal and on IV ABX for L hip osteomyelitis. 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  Has R severe ulnar neuropathy as well. O2 3L full time.   Here for f/u on multitrauma and chronic L hip osteomyelitis with associated chronic pain.     Can half Seroquel - in AM to 25 mg and then 50 mg at bedtime .- Might not need  to reduce Seroquel  at nighttime- might need to stay on 100 mg at bedtime- figure this out by testing it out. Start with reduction of just AM seroquel  first.   2. Lorazepam  has been shown to reduce nausea in many patients, so this could also be why having more nausea.    3.  Need to get PCP to check if osteomyelitis is getting worse- that could cause him to be more nauseated- and could cause his increased pain- suggest they check: CBC with diff, CMP, ESR, CRP and if anything elevated, needs MRI of L hip with contrast. Sees ID at end of October. Move PCP appt up.   4. Will refill MSIR 15 mg #200 for 2months  5 Will refill Duragesic  patch 75 mcg every other day #15- for 2 months  6. Con't Paxil - needs refills- 40 mg daily- send in  7. Con't Gabapentin , Phenergan , Seroquel  as reduced above, Oxybutynin  and Imipramine ;   8. Con't to wean off Lorazepam  since I cannot refill it.   9.  D/w worker's comp about changes  10.  Also increased Phenergan  so can take with 12.5 or 25 mg up to 3x/day for nausea- but I'm concerned it's infection possibly flaring vs reduction in Lorazepam /changing to Seroquel   which he's tolerated well in past.   11. They still haven't found psychiatrist to see him- he NEEDS one! Needs to prescribe meds- in [place].   12.  F/U in 6 weeks for f/u on chronic osteomyelitis and chronic pain syndrome  13. Has drug screen that's due today per  worker's comp and clinic policy.   14. Might be worth asking PCP to see if can treat hypnagogic jerks  15. Talk to worker's comp- because if started losing teeth due to malnutrition when got PEG- then could be covered by workers' comp? Refer to adjuster.   I spent a total of  43  minutes on total care today- >50% coordination of care- due to d/w pt and wife about jerking;  Sleep, his possible increase in Osteomyelitis, increased nausea/vomiting and how to treat- made med adjustments.

## 2024-03-06 NOTE — Patient Instructions (Addendum)
 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 S/P screw removal and on IV ABX for L hip osteomyelitis. 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  Has R severe ulnar neuropathy as well. O2 3L full time.   Here for f/u on multitrauma and chronic L hip osteomyelitis with associated chronic pain.     Can half Seroquel - in AM to 25 mg and then 50 mg at bedtime .- Might not need to reduce Seroquel  at nighttime- might need to stay on 100 mg at bedtime- figure this out by testing it out. Start with reduction of just AM seroquel  first.   2. Lorazepam  has been shown to reduce nausea in many patients, so this could also be why having more nausea.    3.  Need to get PCP to check if osteomyelitis is getting worse- that could cause him to be more nauseated- and could cause his increased pain- suggest they check: CBC with diff, CMP, ESR, CRP and if anything elevated, needs MRI of L hip with contrast. Sees ID at end of October. Move PCP appt up.   4. Will refill MSIR 15 mg #200 for 2months  5 Will refill Duragesic  patch 75 mcg every other day #15- for 2 months  6. Con't Paxil - needs refills- 40 mg daily- send in  7. Con't Gabapentin , Phenergan , Seroquel  as reduced above, Oxybutynin  and Imipramine ;   8.  Con't to wean off Lorazepam  since I cannot refill it.   9.  D/w worker's comp about changes  10.  Also increased Phenergan  so can take with 12.5 or 25 mg up to 3x/day for nausea- but I'm concerned it's infection possibly flaring vs reduction in Lorazepam /changing to Seroquel   which he's tolerated well in past.   11. They still haven't found psychiatrist to see him- he NEEDS one! Needs to prescribe meds.   12.  F/U in 6 weeks for f/u on chronic osteomyelitis and chronic pain syndrome  13. Has drug screen that's due today per worker's comp and clinic policy.   14. Might be worth asking PCP to see if can treat hypnogogic jerks

## 2024-03-07 ENCOUNTER — Emergency Department (HOSPITAL_COMMUNITY): Payer: PRIVATE HEALTH INSURANCE

## 2024-03-07 ENCOUNTER — Encounter (HOSPITAL_COMMUNITY): Payer: Self-pay

## 2024-03-07 ENCOUNTER — Other Ambulatory Visit: Payer: Self-pay

## 2024-03-07 ENCOUNTER — Telehealth: Payer: Self-pay | Admitting: *Deleted

## 2024-03-07 ENCOUNTER — Observation Stay (HOSPITAL_COMMUNITY)
Admission: EM | Admit: 2024-03-07 | Discharge: 2024-03-10 | Disposition: A | Discharge status: Home / Self Care | Payer: PRIVATE HEALTH INSURANCE | Attending: Internal Medicine | Admitting: Internal Medicine

## 2024-03-07 DIAGNOSIS — K219 Gastro-esophageal reflux disease without esophagitis: Secondary | ICD-10-CM | POA: Insufficient documentation

## 2024-03-07 DIAGNOSIS — K5900 Constipation, unspecified: Secondary | ICD-10-CM | POA: Insufficient documentation

## 2024-03-07 DIAGNOSIS — Z8619 Personal history of other infectious and parasitic diseases: Secondary | ICD-10-CM | POA: Insufficient documentation

## 2024-03-07 DIAGNOSIS — M86259 Subacute osteomyelitis, unspecified femur: Secondary | ICD-10-CM | POA: Diagnosis not present

## 2024-03-07 DIAGNOSIS — F39 Unspecified mood [affective] disorder: Secondary | ICD-10-CM | POA: Diagnosis not present

## 2024-03-07 DIAGNOSIS — R5383 Other fatigue: Secondary | ICD-10-CM | POA: Diagnosis not present

## 2024-03-07 DIAGNOSIS — F1590 Other stimulant use, unspecified, uncomplicated: Secondary | ICD-10-CM | POA: Insufficient documentation

## 2024-03-07 DIAGNOSIS — E876 Hypokalemia: Principal | ICD-10-CM | POA: Diagnosis present

## 2024-03-07 DIAGNOSIS — J9611 Chronic respiratory failure with hypoxia: Secondary | ICD-10-CM | POA: Diagnosis not present

## 2024-03-07 DIAGNOSIS — E860 Dehydration: Secondary | ICD-10-CM | POA: Diagnosis not present

## 2024-03-07 DIAGNOSIS — R531 Weakness: Principal | ICD-10-CM | POA: Insufficient documentation

## 2024-03-07 DIAGNOSIS — R338 Other retention of urine: Secondary | ICD-10-CM | POA: Diagnosis not present

## 2024-03-07 DIAGNOSIS — I10 Essential (primary) hypertension: Secondary | ICD-10-CM | POA: Diagnosis not present

## 2024-03-07 DIAGNOSIS — E785 Hyperlipidemia, unspecified: Secondary | ICD-10-CM | POA: Diagnosis not present

## 2024-03-07 DIAGNOSIS — R9431 Abnormal electrocardiogram [ECG] [EKG]: Secondary | ICD-10-CM

## 2024-03-07 LAB — CBC
HCT: 41.8 % (ref 39.0–52.0)
Hemoglobin: 13.6 g/dL (ref 13.0–17.0)
MCH: 29.9 pg (ref 26.0–34.0)
MCHC: 32.5 g/dL (ref 30.0–36.0)
MCV: 91.9 fL (ref 80.0–100.0)
Platelets: 295 K/uL (ref 150–400)
RBC: 4.55 MIL/uL (ref 4.22–5.81)
RDW: 12.3 % (ref 11.5–15.5)
WBC: 8.5 K/uL (ref 4.0–10.5)
nRBC: 0 % (ref 0.0–0.2)

## 2024-03-07 LAB — COMPREHENSIVE METABOLIC PANEL WITH GFR
ALT: 25 U/L (ref 0–44)
AST: 27 U/L (ref 15–41)
Albumin: 3.4 g/dL — ABNORMAL LOW (ref 3.5–5.0)
Alkaline Phosphatase: 78 U/L (ref 38–126)
Anion gap: 17 — ABNORMAL HIGH (ref 5–15)
BUN: 12 mg/dL (ref 6–20)
CO2: 31 mmol/L (ref 22–32)
Calcium: 10.6 mg/dL — ABNORMAL HIGH (ref 8.9–10.3)
Chloride: 88 mmol/L — ABNORMAL LOW (ref 98–111)
Creatinine, Ser: 0.8 mg/dL (ref 0.61–1.24)
GFR, Estimated: >60 mL/min (ref >60)
Glucose, Bld: 106 mg/dL — ABNORMAL HIGH (ref 70–99)
Potassium: 2.6 mmol/L — CL (ref 3.5–5.1)
Sodium: 136 mmol/L (ref 135–145)
Total Bilirubin: 1 mg/dL (ref 0.0–1.2)
Total Protein: 7.3 g/dL (ref 6.5–8.1)

## 2024-03-07 LAB — CBG MONITORING, ED: Glucose-Capillary: 107 mg/dL — ABNORMAL HIGH (ref 70–99)

## 2024-03-07 LAB — C-REACTIVE PROTEIN: CRP: 14.4 mg/dL — ABNORMAL HIGH (ref <1.0)

## 2024-03-07 LAB — MAGNESIUM: Magnesium: 1.7 mg/dL (ref 1.7–2.4)

## 2024-03-07 LAB — SEDIMENTATION RATE: Sed Rate: 38 mm/h — ABNORMAL HIGH (ref 0–16)

## 2024-03-07 MED ORDER — ACETAMINOPHEN 500 MG PO TABS
1000.0000 mg | ORAL_TABLET | Freq: Once | ORAL | Status: AC
  Administered 2024-03-07: 1000 mg via ORAL
  Filled 2024-03-07: qty 2

## 2024-03-07 MED ORDER — POTASSIUM CHLORIDE CRYS ER 20 MEQ PO TBCR
40.0000 meq | EXTENDED_RELEASE_TABLET | Freq: Once | ORAL | Status: AC
  Administered 2024-03-07: 40 meq via ORAL
  Filled 2024-03-07: qty 2

## 2024-03-07 MED ORDER — LACTATED RINGERS IV BOLUS
1000.0000 mL | Freq: Once | INTRAVENOUS | Status: AC
  Administered 2024-03-07: 1000 mL via INTRAVENOUS

## 2024-03-07 MED ORDER — POTASSIUM CHLORIDE 10 MEQ/100ML IV SOLN
10.0000 meq | Freq: Once | INTRAVENOUS | Status: AC
Start: 2024-03-07 — End: 2024-03-08
  Administered 2024-03-07: 10 meq via INTRAVENOUS
  Filled 2024-03-07: qty 100

## 2024-03-07 MED ORDER — LACTATED RINGERS IV BOLUS
500.0000 mL | Freq: Once | INTRAVENOUS | Status: AC
Start: 2024-03-07 — End: 2024-03-08
  Administered 2024-03-07: 500 mL via INTRAVENOUS

## 2024-03-07 MED ORDER — MAGNESIUM SULFATE 2 GM/50ML IV SOLN
2.0000 g | Freq: Once | INTRAVENOUS | Status: AC
  Administered 2024-03-07: 2 g via INTRAVENOUS
  Filled 2024-03-07: qty 50

## 2024-03-07 NOTE — ED Triage Notes (Addendum)
 Pt bib [facility] coming from home with c/o lethargy x3days. EMS reports patient also has had decreased food intake for the past week. Pt does have bed sore that he's been having problems with before. Pt on 4L [place], baseline. Pt denies pain. GCS 15.  EMS VS: 162/90 116 HR 91% 4L

## 2024-03-07 NOTE — ED Notes (Signed)
 CCMD called for cardiac monitoring.

## 2024-03-07 NOTE — Telephone Encounter (Signed)
 Call Dan Freshwater, RN with [facility] number [phone]  VO given new blood test order given  Will fax lab results

## 2024-03-07 NOTE — Telephone Encounter (Signed)
 Copied from CRM #[id number]. Topic: General - Other >> Mar 07, 2024 10:57 AM [name] wrote: Reason for CRM: Wife-[name] would like the PCP to know that yesterday he went to rehab PCP and states he is not wanting to eat currently. Has not eaten in 4-days. Rehab PCP wants Dr. Kennyth to order bloodwork for SED Rate because he has Osteomyelitis in his hip. Wants to know if the HHA could do the labs during her appt with him tomorrow. Wife states he does not want to go to hospital. Is this something we could do for him? Nurse states she can do this with orders (can be verbal orders as well).  Nurse's (Starla Bean, RN with [facility]) number [phone]  [name]'s callback-[phone]   Please advise  [name],RMA

## 2024-03-07 NOTE — ED Notes (Signed)
 Beam, MD notified of patient critical potassium at 2.6. EMS IV access lost at this time. IV fluids paused. Difficulty establishing IV access. IV team consult placed, EDP notified of delay.

## 2024-03-07 NOTE — Telephone Encounter (Signed)
 Copied from CRM #[id number]. Topic: General - Other >> Mar 07, 2024 10:57 AM [name] wrote: Reason for CRM: Wife-[name] would like the PCP to know that yesterday he went to rehab PCP and states he is not wanting to eat currently. Has not eaten in 4-days. Rehab PCP wants Dr. Kennyth to order bloodwork for SED Rate because he has Osteomyelitis in his hip. Wants to know if the HHA could do the labs during her appt with him tomorrow. Wife states he does not want to go to hospital. Is this something we could do for him? Nurse states she can do this with orders (can be verbal orders as well).  Nurse's (Starla Bean, RN with [facility]) number [phone]  [name]'s callback-[phone]

## 2024-03-07 NOTE — Telephone Encounter (Signed)
 Ok to order sed rate, crp, CMET, and CBC with diff.   Recommend appointment here if not improving.

## 2024-03-07 NOTE — ED Notes (Signed)
 Assuming care of pt at this time. Pt is AAOx4. VS's are WNL.

## 2024-03-07 NOTE — ED Provider Notes (Signed)
 [place] EMERGENCY DEPARTMENT AT [facility] Provider Note   CSN: [id number] Arrival date & time: 03/07/24  8170     Patient presents with: Fatigue   Christopher [name] is a 58 y.o. male.   HPI   Patient is a 58 year old male with PMH of chronic hypoxic respiratory failure on 4 L [place] supplemental O2, polytrauma with left femoral neck fracture/degloving of left hip coming into scrotum c/b compartment syndrome status post diverting colostomy and trach/PEG, left hip osteomyelitis following with ID (next appointment 10/25) on and ciprofloxacin , prior C. difficile infection on vancomycin , and RUE DVT on Eliquis  presenting to the ED with chief complaint of x 3 days of generalized weakness with accompanying nausea and dry heaving beginning today.  He further endorses decreased p.o. intake x 3 days.  Patient denies any headaches, vision changes, fevers, chills, chest pain, cough, congestion, shortness of breath, back pain, changes in ostomy output, or changes in Foley catheter output.  Patient endorses chronic left hip pain 7/10 that has not acutely changed.  He denies any recent falls or trauma.  Patient further notes that he is ambulatory with a walker at baseline and is still able to ambulate.   Prior to Admission medications   Medication Sig Start Date End Date Taking? Authorizing Provider  albuterol  (VENTOLIN  HFA) 108 (90 Base) MCG/ACT inhaler INHALE 2 PUFFS BY MOUTH EVERY 6 HOURS AS NEEDED FOR WHEEZE OR SHORTNESS OF BREATH 01/22/24   Kennyth Worth HERO, MD  budesonide -glycopyrrolate -formoterol  (BREZTRI  AEROSPHERE) 160-9-4.8 MCG/ACT AERO inhaler INHALE 2 PUFFS INTO THE LUNGS IN THE MORNING AND AT BEDTIME. 02/27/24   Meade Verdon RAMAN, MD  ciprofloxacin  (CIPRO ) 500 MG tablet Take 1 tablet (500 mg total) by mouth 2 (two) times daily. 10/12/23   Calone, Gregory D, FNP  famotidine  (PEPCID ) 40 MG tablet TAKE 1 TABLET BY MOUTH TWICE A DAY 01/26/21   Lovorn, Megan, MD  fentaNYL  (DURAGESIC ) 75  MCG/HR Place 1 patch onto the skin every other day. ICD 10 code- G89.21- For chronic pain 01/01/24   Lovorn, Megan, MD  fentaNYL  (DURAGESIC ) 75 MCG/HR Place 1 patch onto the skin every other day. G89.21- due 03/24/24 03/06/24   Lovorn, Megan, MD  fentaNYL  (DURAGESIC ) 75 MCG/HR Place 1 patch onto the skin every other day. G89.21-  04/21/24- can refill 03/06/24   Lovorn, Megan, MD  furosemide  (LASIX ) 40 MG tablet TAKE 1 TABLET BY MOUTH EVERY DAY 12/06/23   Kennyth Worth HERO, MD  gabapentin  (NEURONTIN ) 300 MG capsule Take 1 capsule (300 mg total) by mouth 3 (three) times daily. 11/20/23   Lovorn, Megan, MD  GEMTESA 75 MG TABS Take 75 mg by mouth daily. 09/26/22   [provider]  hydrochlorothiazide  (HYDRODIURIL ) 12.5 MG tablet TAKE 1 TABLET BY MOUTH EVERY DAY 07/03/23   Kennyth Worth HERO, MD  imipramine  (TOFRANIL ) 25 MG tablet TAKE 1 TABLET (25 MG TOTAL) BY MOUTH IN THE MORNING, AT NOON, AND AT BEDTIME. FOR BLADDER SPASMS 02/27/24   Lovorn, Megan, MD  LINZESS  72 MCG capsule TAKE 2 CAPSULES (145 MCG TOTAL) BY MOUTH DAILY BEFORE BREAKFAST. 11/27/23   Kennyth Worth HERO, MD  LORazepam  (ATIVAN ) 0.5 MG tablet Take 0.5 mg by mouth 3 (three) times daily. Patient not taking: Reported on 03/06/2024 03/25/22   [provider]  melatonin ([employer] MELATONIN) 3 MG TABS tablet Take 1 tablet (3 mg total) by mouth at bedtime. 11/01/19   Matcha, Anupama, MD  morphine  (MSIR) 15 MG tablet Take 1 tablet (15 mg  total) by mouth every 3 (three) hours as needed for severe pain (pain score 7-10) (breakthrough pain). 03/06/24   Lovorn, Megan, MD  morphine  (MSIR) 15 MG tablet Take 1 tablet (15 mg total) by mouth every 3 (three) hours as needed for severe pain (pain score 7-10). G89.21- for chronic pain due to trauma- can fill 04/21/24 03/06/24   Lovorn, Megan, MD  Multiple Vitamin (MULTIVITAMIN WITH MINERALS) TABS tablet Take 1 tablet by mouth daily. 03/13/19   Buriev, Algis SAILOR, MD  naloxone  (NARCAN ) nasal spray 4 mg/0.1 mL TO USE IF PT  DEVELOPS UNCONSCIOUSNESS OR CONFUSION THAT FAMILY THINKS IS RELATED TO OPIOIDS. 02/27/24   Lovorn, Megan, MD  OLANZapine  (ZYPREXA ) 5 MG tablet Take 2.5-10 mg by mouth See admin instructions. 2.5 mgh in the morning, and midday, and 10 mg at bedtime Patient not taking: Reported on 03/06/2024 06/20/22   [provider]  oxybutynin  (DITROPAN ) 5 MG tablet Take 5 mg by mouth See admin instructions. Take 5 mg by mouth three times a day and an additional 5 mg once daily as needed for urinary urgency 11/13/19   [provider]  Oxycodone  HCl 10 MG TABS Take 1.5 tablets (15 mg total) by mouth every 3 (three) hours as needed. Take one tablet  every 3 hours as needed for sever painTake one tablet  every 3 hours as needed for sever pain Patient not taking: Reported on 03/06/2024 01/04/24   Lovorn, Megan, MD  pantoprazole  (PROTONIX ) 40 MG tablet TAKE 1 TABLET BY MOUTH EVERY DAY 02/26/24   Mansouraty, Gabriel Jr., MD  PARoxetine  (PAXIL ) 40 MG tablet TAKE 1 TABLET BY MOUTH EVERYDAY AT BEDTIME 03/06/24   Lovorn, Megan, MD  potassium chloride  SA (KLOR-CON  M) 20 MEQ tablet TAKE 1 TABLET BY MOUTH EVERY DAY 12/06/23   Kennyth Worth HERO, MD  Probiotic Product (PROBIOTIC COLON SUPPORT) CAPS Take 1 capsule by mouth daily.    [provider]  promethazine  (PHENERGAN ) 12.5 MG tablet TAKE 1 TABLET (12.5 MG TOTAL) BY MOUTH EVERY 6 (SIX) HOURS AS NEEDED FOR NAUSEA, VOMITING OR REFRACTORY NAUSEA / VOMITING. 01/19/24   Lovorn, Megan, MD  promethazine  (PHENERGAN ) 25 MG tablet Take 1 tablet (25 mg total) by mouth every 6 (six) hours as needed for nausea or vomiting. Can take EITHER 12.5 or 25 mg 3x/day as needed for persistent nausea/dry heaves 03/06/24   Lovorn, Megan, MD  QUEtiapine  (SEROQUEL ) 50 MG tablet TAKE 1 TABLET (50 MG TOTAL) BY MOUTH 3 (THREE) TIMES DAILY. 50 MG IN AM AND 100 MG AT BEDTIME- FOR HALLUCINATIONS- 02/13/24   Lovorn, Duwaine, MD  senna-docusate (SENOKOT-S) 8.6-50 MG tablet Take 2 tablets by mouth 3 (three)  times daily. 04/13/20   [provider]  Spacer/Aero-Holding Chambers (AEROCHAMBER MV) inhaler Use as instructed with MDI 04/14/23   Desai, Nikita S, MD  vancomycin  (VANCOCIN ) 125 MG capsule Take 1 capsule (125 mg total) by mouth in the morning and at bedtime. 09/26/23   Calone, Gregory D, FNP    Allergies: Methadone     Review of Systems  Updated Vital Signs BP 122/85   Pulse 95   Temp 98.2 F (36.8 C) (Oral)   Resp 16   Ht 6' 3 (1.905 m)   SpO2 95%   BMI 24.50 kg/m   Physical Exam Constitutional:      Appearance: Normal appearance.  HENT:     Head: Normocephalic and atraumatic.     Nose: Nose normal.     Mouth/Throat:  Mouth: Mucous membranes are dry.  Eyes:     Extraocular Movements: Extraocular movements intact.     Pupils: Pupils are equal, round, and reactive to light.  Cardiovascular:     Rate and Rhythm: Regular rhythm. Tachycardia present.     Pulses: Normal pulses.     Heart sounds: Normal heart sounds.  Abdominal:     General: Abdomen is flat.     Palpations: Abdomen is soft.     Comments: Ostomy site C/D/I with normal-appearing ostomy output  Skin:    General: Skin is warm and dry.     Capillary Refill: Capillary refill takes 2 to 3 seconds.  Neurological:     General: No focal deficit present.     Mental Status: He is alert and oriented to person, place, and time.     (all labs ordered are listed, but only abnormal results are displayed) Labs Reviewed  COMPREHENSIVE METABOLIC PANEL WITH GFR - Abnormal; Notable for the following components:      Result Value   Potassium 2.6 (*)    Chloride 88 (*)    Glucose, Bld 106 (*)    Calcium  10.6 (*)    Albumin  3.4 (*)    Anion gap 17 (*)    All other components within normal limits  SEDIMENTATION RATE - Abnormal; Notable for the following components:   Sed Rate 38 (*)    All other components within normal limits  C-REACTIVE PROTEIN - Abnormal; Notable for the following components:   CRP 14.4  (*)    All other components within normal limits  CBG MONITORING, ED - Abnormal; Notable for the following components:   Glucose-Capillary 107 (*)    All other components within normal limits  CBC  MAGNESIUM   URINALYSIS, ROUTINE W REFLEX MICROSCOPIC    EKG: None  Radiology: DG Chest 2 View Result Date: 03/07/2024 CLINICAL DATA:  Weakness EXAM: CHEST - 2 VIEW COMPARISON:  10/14/2023 FINDINGS: Heart and mediastinal contours are within normal limits. No focal opacities or effusions. No acute bony abnormality. IMPRESSION: No active cardiopulmonary disease. Electronically Signed   By: Franky Crease M.D.   On: 03/07/2024 20:18     Procedures   Medications Ordered in the ED  magnesium  sulfate IVPB 2 g 50 mL (has no administration in time range)  potassium chloride  10 mEq in 100 mL IVPB (has no administration in time range)  lactated ringers  bolus 500 mL (has no administration in time range)  acetaminophen  (TYLENOL ) tablet 1,000 mg (has no administration in time range)  lactated ringers  bolus 1,000 mL (1,000 mLs Intravenous New Bag/Given 03/07/24 1956)  potassium chloride  SA (KLOR-CON  M) CR tablet 40 mEq (40 mEq Oral Given 03/07/24 2134)    Clinical Course as of 03/07/24 2148  Thu Mar 07, 2024  1910 Clinically dehydrated. [WB]    Clinical Course User Index [WB] Bradley Handyside, Elsie, MD                                Medical Decision Making Amount and/or Complexity of Data Reviewed Labs: ordered. Radiology: ordered.  Risk OTC drugs. Prescription drug management.  58 year old male with PMH as above presenting to the ED with generalized weakness/fatigue and nausea with decreased p.o. intake x 3 days.  No abdominal pain.  No changes in bowel movements or UOP.  No infectious symptoms including fevers or chills.  Upon arrival, patient hemodynamically stable.  Normotensive.  Afebrile.  Sinus  tachycardic 105 bpm.  No tachypnea.  Saturating 98% on baseline 4L [place].  POC glucose 107.  Patient  warm and well-perfused with warm extremities.  Evidence of mild clinical dehydration with dry oral mucosa and 2-3-second capillary refill.  Patient given 1000 cc LR bolus.  Labs significant for hypomagnesemia, Mg 1.7 as well as hypokalemia with K2.6.  Renal function stable at baseline.  Patient given IV magnesium  2 g as well as 10 mill equivalents IV potassium with 40 mill equivalents oral replacement.  Subsequent 500 cc bolus LR given.  CBC without leukocytosis.  ESR 38/CRP 14.4.  Doubt acute osteomyelitis at this time.  Patient updated regarding plan of care and agreeable with admission.  Hospitalist team paged and updated regarding plan of care.  Final diagnoses:  Hypokalemia  Hypomagnesemia  Dehydration    ED Discharge Orders     None          [place] Dube, Elsie, MD 03/07/24 7851    Garrick Charleston, MD 03/08/24 2207

## 2024-03-08 ENCOUNTER — Telehealth: Payer: Self-pay | Admitting: Physical Medicine and Rehabilitation

## 2024-03-08 DIAGNOSIS — E876 Hypokalemia: Secondary | ICD-10-CM | POA: Diagnosis not present

## 2024-03-08 DIAGNOSIS — R9431 Abnormal electrocardiogram [ECG] [EKG]: Secondary | ICD-10-CM

## 2024-03-08 LAB — BASIC METABOLIC PANEL WITH GFR
Anion gap: 13 (ref 5–15)
BUN: 11 mg/dL (ref 6–20)
CO2: 29 mmol/L (ref 22–32)
Calcium: 9.7 mg/dL (ref 8.9–10.3)
Chloride: 93 mmol/L — ABNORMAL LOW (ref 98–111)
Creatinine, Ser: 0.66 mg/dL (ref 0.61–1.24)
GFR, Estimated: >60 mL/min (ref >60)
Glucose, Bld: 98 mg/dL (ref 70–99)
Potassium: 3.1 mmol/L — ABNORMAL LOW (ref 3.5–5.1)
Sodium: 135 mmol/L (ref 135–145)

## 2024-03-08 LAB — TROPONIN I (HIGH SENSITIVITY): Troponin I (High Sensitivity): 4 ng/L (ref <18)

## 2024-03-08 LAB — MAGNESIUM: Magnesium: 2 mg/dL (ref 1.7–2.4)

## 2024-03-08 MED ORDER — PANTOPRAZOLE SODIUM 40 MG PO TBEC
40.0000 mg | DELAYED_RELEASE_TABLET | Freq: Every day | ORAL | Status: DC
  Administered 2024-03-08 – 2024-03-10 (×3): 40 mg via ORAL
  Filled 2024-03-08 (×3): qty 1

## 2024-03-08 MED ORDER — SODIUM CHLORIDE 0.9 % IV SOLN: INTRAVENOUS | Status: AC

## 2024-03-08 MED ORDER — ACETAMINOPHEN 325 MG PO TABS: 650.0000 mg | ORAL_TABLET | Freq: Four times a day (QID) | ORAL | Status: DC | PRN

## 2024-03-08 MED ORDER — QUETIAPINE FUMARATE 100 MG PO TABS
100.0000 mg | ORAL_TABLET | Freq: Every day | ORAL | Status: DC
  Administered 2024-03-08 – 2024-03-09 (×2): 100 mg via ORAL
  Filled 2024-03-08: qty 1
  Filled 2024-03-08 (×2): qty 2
  Filled 2024-03-08: qty 1

## 2024-03-08 MED ORDER — MIRABEGRON ER 25 MG PO TB24
25.0000 mg | ORAL_TABLET | Freq: Every day | ORAL | Status: DC
  Administered 2024-03-08 – 2024-03-10 (×3): 25 mg via ORAL
  Filled 2024-03-08 (×3): qty 1

## 2024-03-08 MED ORDER — LORAZEPAM 1 MG PO TABS
1.0000 mg | ORAL_TABLET | Freq: Once | ORAL | Status: AC | PRN
  Administered 2024-03-09: 1 mg via ORAL
  Filled 2024-03-08: qty 1

## 2024-03-08 MED ORDER — QUETIAPINE 12.5 MG HALF TABLET
25.0000 mg | ORAL_TABLET | Freq: Every day | ORAL | Status: DC
  Administered 2024-03-08 – 2024-03-10 (×3): 25 mg via ORAL
  Filled 2024-03-08 (×3): qty 2

## 2024-03-08 MED ORDER — SENNOSIDES-DOCUSATE SODIUM 8.6-50 MG PO TABS
2.0000 | ORAL_TABLET | Freq: Three times a day (TID) | ORAL | Status: DC
  Administered 2024-03-08 – 2024-03-09 (×5): 2 via ORAL
  Filled 2024-03-08 (×7): qty 2

## 2024-03-08 MED ORDER — FAMOTIDINE 20 MG PO TABS
40.0000 mg | ORAL_TABLET | Freq: Two times a day (BID) | ORAL | Status: DC
Start: 2024-03-08 — End: 2024-03-10
  Administered 2024-03-08 – 2024-03-10 (×5): 40 mg via ORAL
  Filled 2024-03-08 (×5): qty 2

## 2024-03-08 MED ORDER — OXYBUTYNIN CHLORIDE 5 MG PO TABS
5.0000 mg | ORAL_TABLET | Freq: Every day | ORAL | Status: DC | PRN
  Filled 2024-03-08 (×2): qty 1

## 2024-03-08 MED ORDER — ENOXAPARIN SODIUM 40 MG/0.4ML IJ SOSY
40.0000 mg | PREFILLED_SYRINGE | INTRAMUSCULAR | Status: DC
  Administered 2024-03-08 – 2024-03-09 (×2): 40 mg via SUBCUTANEOUS
  Filled 2024-03-08 (×2): qty 0.4

## 2024-03-08 MED ORDER — ALBUTEROL SULFATE (2.5 MG/3ML) 0.083% IN NEBU: 2.5000 mg | INHALATION_SOLUTION | Freq: Four times a day (QID) | RESPIRATORY_TRACT | Status: DC | PRN

## 2024-03-08 MED ORDER — FENTANYL 75 MCG/HR TD PT72
1.0000 | MEDICATED_PATCH | TRANSDERMAL | Status: DC
  Administered 2024-03-09: 1 via TRANSDERMAL
  Filled 2024-03-08: qty 1

## 2024-03-08 MED ORDER — PAROXETINE HCL 20 MG PO TABS
40.0000 mg | ORAL_TABLET | Freq: Every day | ORAL | Status: DC
  Administered 2024-03-08 – 2024-03-09 (×2): 40 mg via ORAL
  Filled 2024-03-08 (×2): qty 2

## 2024-03-08 MED ORDER — IMIPRAMINE HCL 25 MG PO TABS
25.0000 mg | ORAL_TABLET | Freq: Three times a day (TID) | ORAL | Status: DC
  Administered 2024-03-08 – 2024-03-10 (×7): 25 mg via ORAL
  Filled 2024-03-08 (×7): qty 1

## 2024-03-08 MED ORDER — NALOXONE HCL 0.4 MG/ML IJ SOLN: 0.4000 mg | INTRAMUSCULAR | Status: DC | PRN

## 2024-03-08 MED ORDER — LINACLOTIDE 145 MCG PO CAPS
145.0000 ug | ORAL_CAPSULE | Freq: Every day | ORAL | Status: DC
  Administered 2024-03-08 – 2024-03-10 (×3): 145 ug via ORAL
  Filled 2024-03-08 (×3): qty 1

## 2024-03-08 MED ORDER — GABAPENTIN 300 MG PO CAPS
300.0000 mg | ORAL_CAPSULE | Freq: Three times a day (TID) | ORAL | Status: DC
  Administered 2024-03-08 – 2024-03-10 (×7): 300 mg via ORAL
  Filled 2024-03-08 (×7): qty 1

## 2024-03-08 MED ORDER — VANCOMYCIN HCL 125 MG PO CAPS
125.0000 mg | ORAL_CAPSULE | Freq: Two times a day (BID) | ORAL | Status: DC
  Administered 2024-03-08 – 2024-03-10 (×5): 125 mg via ORAL
  Filled 2024-03-08 (×5): qty 1

## 2024-03-08 MED ORDER — FENTANYL 75 MCG/HR TD PT72: 1.0000 | MEDICATED_PATCH | TRANSDERMAL | Status: DC

## 2024-03-08 MED ORDER — CIPROFLOXACIN HCL 500 MG PO TABS
500.0000 mg | ORAL_TABLET | Freq: Two times a day (BID) | ORAL | Status: DC
Start: 2024-03-08 — End: 2024-03-10
  Administered 2024-03-08 – 2024-03-10 (×5): 500 mg via ORAL
  Filled 2024-03-08 (×5): qty 1

## 2024-03-08 MED ORDER — MORPHINE SULFATE 15 MG PO TABS
15.0000 mg | ORAL_TABLET | ORAL | Status: DC | PRN
  Administered 2024-03-08 – 2024-03-10 (×7): 15 mg via ORAL
  Filled 2024-03-08 (×7): qty 1

## 2024-03-08 MED ORDER — ADULT MULTIVITAMIN W/MINERALS CH
1.0000 | ORAL_TABLET | Freq: Every day | ORAL | Status: DC
  Administered 2024-03-08 – 2024-03-10 (×3): 1 via ORAL
  Filled 2024-03-08 (×3): qty 1

## 2024-03-08 MED ORDER — POTASSIUM CHLORIDE CRYS ER 20 MEQ PO TBCR
40.0000 meq | EXTENDED_RELEASE_TABLET | Freq: Once | ORAL | Status: AC
  Administered 2024-03-08: 40 meq via ORAL
  Filled 2024-03-08: qty 2

## 2024-03-08 MED ORDER — OXYBUTYNIN CHLORIDE 5 MG PO TABS
5.0000 mg | ORAL_TABLET | Freq: Three times a day (TID) | ORAL | Status: DC
  Administered 2024-03-08 – 2024-03-10 (×7): 5 mg via ORAL
  Filled 2024-03-08 (×7): qty 1

## 2024-03-08 MED ORDER — BUDESON-GLYCOPYRROL-FORMOTEROL 160-9-4.8 MCG/ACT IN AERO
2.0000 | INHALATION_SPRAY | Freq: Two times a day (BID) | RESPIRATORY_TRACT | Status: DC
  Administered 2024-03-08 – 2024-03-10 (×5): 2 via RESPIRATORY_TRACT
  Filled 2024-03-08: qty 5.9

## 2024-03-08 MED ORDER — MELATONIN 3 MG PO TABS
3.0000 mg | ORAL_TABLET | Freq: Every day | ORAL | Status: DC
Start: 2024-03-08 — End: 2024-03-10
  Administered 2024-03-08 – 2024-03-09 (×2): 3 mg via ORAL
  Filled 2024-03-08 (×2): qty 1

## 2024-03-08 MED ORDER — ACETAMINOPHEN 650 MG RE SUPP: 650.0000 mg | Freq: Four times a day (QID) | RECTAL | Status: DC | PRN

## 2024-03-08 MED ORDER — RISAQUAD PO CAPS
1.0000 | ORAL_CAPSULE | Freq: Every day | ORAL | Status: DC
  Administered 2024-03-08 – 2024-03-10 (×3): 1 via ORAL
  Filled 2024-03-08 (×3): qty 1

## 2024-03-08 NOTE — H&P (Signed)
 History and Physical    Christopher [name] FMW:[id number] DOB: [birth year] DOA: 03/07/2024  PCP: Kennyth Worth HERO, MD  Patient coming from: Home  Chief Complaint: Generalized weakness  HPI: Christopher [name] is a 58 y.o. male with medical history significant of chronic respiratory failure on 4 L supplemental oxygen , polytrauma with left femoral neck fracture/degloving of left hip coming into scrotum complicated by compartment syndrome status post diverting colostomy, chronic indwelling Foley catheter, wheelchair-bound, chronic osteomyelitis of the left hip on chronic ciprofloxacin  therapy, anxiety/depression, chronic pain syndrome, hypertension, history of right upper extremity DVT no longer on anticoagulation, GERD presenting with a chief complaint of generalized weakness/fatigue for the past few days.  Also reports having dry heaving and poor p.o. intake.  Denies any sick contacts.  He has no other complaints.  Denies fevers, cough, shortness breath, chest pain, vomiting, abdominal pain, or diarrhea.  Not able to give any additional history.  ED Course: Slightly tachycardic on arrival but remainder of vital signs stable.  No change in oxygen  requirement from baseline.  Labs showing no leukocytosis or anemia, potassium 2.6, chloride 88, creatinine 0.8, calcium  10.6, albumin  3.4, normal LFTs, UA pending, magnesium  1.7, ESR 38, CRP 14.4.  Chest x-ray showing no active cardiopulmonary disease.  EKG showing sinus tachycardia, T wave abnormality inferolaterally, QTc 495.  T wave abnormalities in inferior leads appear more pronounced compared to previous EKG from January 2025.  Patient was given Tylenol , oral potassium 40 mEq, IV potassium 10 mEq, 1.5 L IV fluids, and IV mag 2 g.  Tachycardia improved after IV fluids.  Review of Systems:  Review of Systems  All other systems reviewed and are negative.   Past Medical History:  Diagnosis Date   Acute on chronic respiratory failure with hypoxia (HCC)  06/2018   trach removed 11-16-2018, on vent from jan until may 2020 - uses albuterol  prn   Anxiety    Bacteremia due to Pseudomonas 06/2018   Chronic osteomyelitis (HCC)    Chronic pain syndrome    Clostridium difficile colitis 10/30/2019   tx with abx    Depression    DVT (deep venous thrombosis) (HCC) 2020   right brachial post PICC line   History of blood transfusion 06/2018   History of Clostridioides difficile colitis    History of kidney stones    Hypertension    norvasc  d/c by pcp on 11/05/19   Multiple traumatic injuries    Penile pain 11/18/2019   Pneumonia 11/2009   2020 x 2   Walker as ambulation aid    Wheelchair bound    electric   Wound discharge    left hip wound with bloody/clear drainage change dressing q day surgilube with gauze, between legs wound using calcium  algenate pad bid    Past Surgical History:  Procedure Laterality Date   APPLICATION OF A-CELL OF BACK N/A 08/06/2018   Procedure: Application Of A-Cell Of Back;  Surgeon: Lowery Estefana RAMAN, DO;  Location: [facility];  Service: Plastics;  Laterality: N/A;   APPLICATION OF A-CELL OF EXTREMITY Left 08/06/2018   Procedure: Application Of A-Cell Of Extremity;  Surgeon: Lowery Estefana RAMAN, DO;  Location: [facility];  Service: Plastics;  Laterality: Left;   APPLICATION OF A-CELL OF EXTREMITY Left 09/18/2019   Procedure: APPLICATION OF A-CELL OF EXTREMITY;  Surgeon: Lowery Estefana RAMAN, DO;  Location: [facility];  Service: Plastics;  Laterality: Left;   APPLICATION OF WOUND VAC  07/12/2018   Procedure: Application Of Wound Vac to the  Left Thigh and Scrotum.;  Surgeon: Kendal Franky SQUIBB, MD;  Location: [facility];  Service: Orthopedics;;   APPLICATION OF WOUND VAC  07/10/2018   Procedure: Application Of Wound Vac;  Surgeon: Signe Mitzie LABOR, MD;  Location: [facility] OR;  Service: General;;   BIOPSY  11/02/2022   Procedure: BIOPSY;  Surgeon: Wilhelmenia Aloha Raddle., MD;  Location: [facility];  Service: Gastroenterology;;   COLON SURGERY   2020   colostomy   COLOSTOMY N/A 07/23/2018   Procedure: COLOSTOMY;  Surgeon: Sebastian Moles, MD;  Location: [facility] OR;  Service: General;  Laterality: N/A;   CYSTOSCOPY W/ URETERAL STENT PLACEMENT N/A 07/15/2018   Procedure: RETROGRADE URETHROGRAM;  Surgeon: Matilda Senior, MD;  Location: [facility] OR;  Service: Urology;  Laterality: N/A;   CYSTOSCOPY WITH LITHOLAPAXY N/A 05/06/2019   Procedure: CYSTOSCOPY BASKET BLADDER STONE EXTRACTION;  Surgeon: Sherrilee Belvie CROME, MD;  Location: [facility];  Service: Urology;  Laterality: N/A;  30 MINS   CYSTOSTOMY N/A 05/06/2019   Procedure: REPLACEMENT OF SUPRAPUBIC CATHETER;  Surgeon: Sherrilee Belvie CROME, MD;  Location: [facility];  Service: Urology;  Laterality: N/A;   DEBRIDEMENT AND CLOSURE WOUND Left 03/04/2019   Procedure: Excision of hip wound with placement of Acell;  Surgeon: Lowery Estefana RAMAN, DO;  [facility]: [facility];  Service: Plastics;  Laterality: Left;   ESOPHAGOGASTRODUODENOSCOPY N/A 08/14/2018   Procedure: ESOPHAGOGASTRODUODENOSCOPY (EGD);  Surgeon: Sebastian Moles, MD;  Location: [facility] END[facility]COPY;  Service: General;  Laterality: N/A;  bedside   ESOPHAGOGASTRODUODENOSCOPY (EGD) WITH PROPOFOL  N/A 11/02/2022   Procedure: ESOPHAGOGASTRODUODENOSCOPY (EGD) WITH PROPOFOL ;  Surgeon: Wilhelmenia Aloha Raddle., MD;  Location: [facility];  Service: Gastroenterology;  Laterality: N/A;   FACIAL RECONSTRUCTION SURGERY     X 2--once as a teenager and second time in his 45's   HARDWARE REMOVAL Left 03/04/2019   Procedure: Left Hip Hardware Removal;  Surgeon: Kendal Franky SQUIBB, MD;  [facility]: [facility];  Service: Orthopedics;  Laterality: Left;   HEMOSTASIS CLIP PLACEMENT  11/02/2022   Procedure: HEMOSTASIS CLIP PLACEMENT;  Surgeon: Wilhelmenia Aloha Raddle., MD;  Location: [facility];  Service: Gastroenterology;;   HIP PINNING,CANNULATED Left 07/12/2018   Procedure: CANNULATED HIP PINNING;  Surgeon: Kendal Franky SQUIBB, MD;  [facility];   Service: Orthopedics;  Laterality: Left;   HIP SURGERY     HOLMIUM LASER APPLICATION Right 07/15/2019   Procedure: HOLMIUM LASER APPLICATION;  Surgeon: Sherrilee Belvie CROME, MD;  Location: [facility];  Service: Urology;  Laterality: Right;   HOT HEMOSTASIS N/A 11/02/2022   Procedure: HOT HEMOSTASIS (ARGON PLASMA COAGULATION/BICAP);  Surgeon: Wilhelmenia Aloha Raddle., MD;  Location: [facility];  Service: Gastroenterology;  Laterality: N/A;   I & D EXTREMITY Left 07/25/2018   Procedure: Debridement of buttock, scrotum and left leg, placement of acell and vac;  Surgeon: Lowery Estefana RAMAN, DO;  [facility];  Service: Plastics;  Laterality: Left;   I & D EXTREMITY N/A 08/06/2018   Procedure: Debridement of buttock, scrotum and left leg;  Surgeon: Lowery Estefana RAMAN, DO;  [facility]: [facility];  Service: Plastics;  Laterality: N/A;   I & D EXTREMITY N/A 08/13/2018   Procedure: Debridement of buttock, scrotum and left leg, placement of acell and vac;  Surgeon: Lowery Estefana RAMAN, DO;  [facility]: [facility];  Service: Plastics;  Laterality: N/A;  90 min, please   INCISION AND DRAINAGE HIP Left 09/18/2019   Procedure: IRRIGATION AND DEBRIDEMENT HIP/ PELVIS WITH WOUND VAC PLACEMENT;  Surgeon: Haddix, Kevin  P, MD;  [facility]: [facility];  Service: Orthopedics;  Laterality: Left;   INCISION AND DRAINAGE OF WOUND N/A 07/18/2018   Procedure: Debridement of left leg, buttocks and scrotal wound with placement of acell and Flexiseal;  Surgeon: Lowery Estefana RAMAN, DO;  [facility]: [facility];  Service: Plastics;  Laterality: N/A;   INCISION AND DRAINAGE OF WOUND Left 08/29/2018   Procedure: Debridement of buttock, scrotum and left leg, placement of acell and vac;  Surgeon: Lowery Estefana RAMAN, DO;  [facility]: [facility];  Service: Plastics;  Laterality: Left;  75 min, please   INCISION AND DRAINAGE OF WOUND Bilateral 10/23/2018   Procedure: DEBRIDEMENT OF BUTTOCK,SCROTUM, AND LEG WOUNDS WITH PLACEMENT OF ACELL- BILATERAL 90 MIN;   Surgeon: Lowery Estefana RAMAN, DO;  [facility];  Service: Plastics;  Laterality: Bilateral;   IR ANGIOGRAM PELVIS SELECTIVE OR SUPRASELECTIVE  07/10/2018   IR ANGIOGRAM PELVIS SELECTIVE OR SUPRASELECTIVE  07/10/2018   IR ANGIOGRAM SELECTIVE EACH ADDITIONAL VESSEL  07/10/2018   IR EMBO ART  VEN HEMORR LYMPH EXTRAV  INC GUIDE ROADMAPPING  07/10/2018   IR GASTROSTOMY TUBE MOD SED  01/21/2021   IR NEPHROSTOMY PLACEMENT LEFT  04/05/2019   IR NEPHROSTOMY PLACEMENT RIGHT  05/31/2019   IR US  GUIDE BX ASP/DRAIN  07/10/2018   IR US  GUIDE VASC ACCESS RIGHT  07/10/2018   IR VENO/EXT/UNI LEFT  07/10/2018   IRRIGATION AND DEBRIDEMENT OF WOUND WITH SPLIT THICKNESS SKIN GRAFT Left 09/19/2018   Procedure: Debridement of gluteal wound with placement of acell to left leg.;  Surgeon: Lowery Estefana RAMAN, DO;  [facility];  Service: Plastics;  Laterality: Left;  2.5 hours, please   LAPAROTOMY N/A 07/12/2018   Procedure: EXPLORATORY LAPAROTOMY;  Surgeon: Sebastian Moles, MD;  Location: [facility] OR;  Service: General;  Laterality: N/A;   LAPAROTOMY N/A 07/15/2018   Procedure: WOUND EXPLORATION; CLOSURE OF ABDOMEN;  Surgeon: Sebastian Moles, MD;  Location: [facility] OR;  Service: General;  Laterality: N/A;   LAPAROTOMY  07/10/2018   Procedure: Exploratory Laparotomy;  Surgeon: Signe Mitzie LABOR, MD;  Location: [facility] OR;  Service: General;;   MASS EXCISION Left 09/18/2019   Procedure: EXCISION UPPER LEFT INNER THIGH WOUND;  Surgeon: Lowery Estefana RAMAN, DO;  [facility];  Service: Plastics;  Laterality: Left;   NEPHROLITHOTOMY Right 07/15/2019   Procedure: NEPHROLITHOTOMY PERCUTANEOUS;  Surgeon: Sherrilee Belvie CROME, MD;  Location: [facility];  Service: Urology;  Laterality: Right;  90 MINS   PEG PLACEMENT N/A 08/14/2018   Procedure: PERCUTANEOUS ENDOSCOPIC GASTROSTOMY (PEG) PLACEMENT;  Surgeon: Sebastian Moles, MD;  Location: [facility] END[facility]COPY;  Service: General;  Laterality: N/A;   PERCUTANEOUS TRACHEOSTOMY N/A 08/02/2018    Procedure: PERCUTANEOUS TRACHEOSTOMY;  Surgeon: Sebastian Moles, MD;  Location: [facility] OR;  Service: General;  Laterality: N/A;   RADIOLOGY WITH ANESTHESIA N/A 07/10/2018   Procedure: IR WITH ANESTHESIA;  Surgeon: Adele Rush, MD;  Location: [facility] OR;  Service: Radiology;  Laterality: N/A;   RADIOLOGY WITH ANESTHESIA Right 07/10/2018   Procedure: Ir With Anesthesia;  Surgeon: Adele Rush, MD;  Location: [facility] OR;  Service: Radiology;  Laterality: Right;   SAVORY DILATION N/A 11/02/2022   Procedure: SAVORY DILATION;  Surgeon: Wilhelmenia Aloha Raddle., MD;  Location: [facility];  Service: Gastroenterology;  Laterality: N/A;   SCROTAL EXPLORATION N/A 07/15/2018   Procedure: SCROTUM DEBRIDEMENT;  Surgeon: Matilda Senior, MD;  Location: [facility] OR;  Service: Urology;  Laterality: N/A;   SHOULDER SURGERY     SKIN SPLIT GRAFT Right  09/19/2018   Procedure: Skin Graft Split Thickness;  Surgeon: Lowery Estefana RAMAN, DO;  Location: [facility];  Service: Plastics;  Laterality: Right;   SKIN SPLIT GRAFT N/A 10/03/2018   Procedure: Split thickness skin graft to gluteal area with acell placement;  Surgeon: Lowery Estefana RAMAN, DO;  Location: [facility];  Service: Plastics;  Laterality: N/A;  3 hours, please   VACUUM ASSISTED CLOSURE CHANGE N/A 07/12/2018   Procedure: ABDOMINAL VACUUM ASSISTED CLOSURE CHANGE and abdominal washout;  Surgeon: Sebastian Moles, MD;  Location: [facility] OR;  Service: General;  Laterality: N/A;   WOUND DEBRIDEMENT Left 07/23/2018   Procedure: DEBRIDEMENT LEFT BUTTOCK  WOUND;  Surgeon: Sebastian Moles, MD;  Location: [facility] OR;  Service: General;  Laterality: Left;   WOUND EXPLORATION Left 07/10/2018   Procedure: WOUND EXPLORATION LEFT GROIN;  Surgeon: Signe Mitzie LABOR, MD;  Location: [facility] OR;  Service: General;  Laterality: Left;     reports that he quit smoking about 5 years ago. His smoking use included cigarettes. He started smoking about 25 years ago. He has a 20 pack-year smoking history. He has never used smokeless  tobacco. He reports current drug use. Drugs: Oxycodone  and Fentanyl . He reports that he does not drink alcohol.  Allergies  Allergen Reactions   Methadone  Other (See Comments)    Hallucinations/confusion    Family History  Problem Relation Age of Onset   Breast cancer Mother        with mets to the bones    Prior to Admission medications   Medication Sig Start Date End Date Taking? Authorizing Provider  albuterol  (VENTOLIN  HFA) 108 (90 Base) MCG/ACT inhaler INHALE 2 PUFFS BY MOUTH EVERY 6 HOURS AS NEEDED FOR WHEEZE OR SHORTNESS OF BREATH 01/22/24  Yes Kennyth Worth HERO, MD  budesonide -glycopyrrolate -formoterol  (BREZTRI  AEROSPHERE) 160-9-4.8 MCG/ACT AERO inhaler INHALE 2 PUFFS INTO THE LUNGS IN THE MORNING AND AT BEDTIME. 02/27/24  Yes Meade Verdon RAMAN, MD  ciprofloxacin  (CIPRO ) 500 MG tablet Take 1 tablet (500 mg total) by mouth 2 (two) times daily. 10/12/23  Yes Calone, Gregory D, FNP  famotidine  (PEPCID ) 40 MG tablet TAKE 1 TABLET BY MOUTH TWICE A DAY 01/26/21  Yes Lovorn, Megan, MD  fentaNYL  (DURAGESIC ) 75 MCG/HR Place 1 patch onto the skin every other day. G89.21- due 03/24/24 03/06/24  Yes Lovorn, Megan, MD  furosemide  (LASIX ) 40 MG tablet TAKE 1 TABLET BY MOUTH EVERY DAY 12/06/23  Yes Kennyth Worth HERO, MD  gabapentin  (NEURONTIN ) 300 MG capsule Take 1 capsule (300 mg total) by mouth 3 (three) times daily. 11/20/23  Yes Lovorn, Megan, MD  GEMTESA 75 MG TABS Take 75 mg by mouth daily. 09/26/22  Yes [provider]  hydrochlorothiazide  (HYDRODIURIL ) 12.5 MG tablet TAKE 1 TABLET BY MOUTH EVERY DAY 07/03/23  Yes Kennyth Worth HERO, MD  imipramine  (TOFRANIL ) 25 MG tablet TAKE 1 TABLET (25 MG TOTAL) BY MOUTH IN THE MORNING, AT NOON, AND AT BEDTIME. FOR BLADDER SPASMS 02/27/24  Yes Lovorn, Megan, MD  LINZESS  72 MCG capsule TAKE 2 CAPSULES (145 MCG TOTAL) BY MOUTH DAILY BEFORE BREAKFAST. 11/27/23  Yes Kennyth Worth HERO, MD  melatonin ([employer] MELATONIN) 3 MG TABS tablet Take 1 tablet (3 mg total) by mouth at  bedtime. 11/01/19  Yes Matcha, Anupama, MD  morphine  (MSIR) 15 MG tablet Take 1 tablet (15 mg total) by mouth every 3 (three) hours as needed for severe pain (pain score 7-10) (breakthrough pain). 03/06/24  Yes Lovorn, Megan, MD  Multiple Vitamin (MULTIVITAMIN WITH MINERALS)  TABS tablet Take 1 tablet by mouth daily. 03/13/19  Yes Buriev, Algis SAILOR, MD  oxybutynin  (DITROPAN ) 5 MG tablet Take 5 mg by mouth See admin instructions. Take 5 mg by mouth three times a day and an additional 5 mg once daily as needed for urinary urgency 11/13/19  Yes [provider]  pantoprazole  (PROTONIX ) 40 MG tablet TAKE 1 TABLET BY MOUTH EVERY DAY 02/26/24  Yes Mansouraty, Gabriel Jr., MD  PARoxetine  (PAXIL ) 40 MG tablet TAKE 1 TABLET BY MOUTH EVERYDAY AT BEDTIME 03/06/24  Yes Lovorn, Megan, MD  potassium chloride  SA (KLOR-CON  M) 20 MEQ tablet TAKE 1 TABLET BY MOUTH EVERY DAY 12/06/23  Yes Kennyth Worth HERO, MD  Probiotic Product (PROBIOTIC COLON SUPPORT) CAPS Take 1 capsule by mouth daily.   Yes [provider]  promethazine  (PHENERGAN ) 12.5 MG tablet TAKE 1 TABLET (12.5 MG TOTAL) BY MOUTH EVERY 6 (SIX) HOURS AS NEEDED FOR NAUSEA, VOMITING OR REFRACTORY NAUSEA / VOMITING. 01/19/24  Yes Lovorn, Megan, MD  QUEtiapine  (SEROQUEL ) 50 MG tablet TAKE 1 TABLET (50 MG TOTAL) BY MOUTH 3 (THREE) TIMES DAILY. 50 MG IN AM AND 100 MG AT BEDTIME- FOR HALLUCINATIONS- Patient taking differently: Take 25-100 mg by mouth See admin instructions. Take 1/2 tablet by mouth in morning and 2 tablets at bedtime- for hallucinations- 02/13/24  Yes Lovorn, Megan, MD  senna-docusate (SENOKOT-S) 8.6-50 MG tablet Take 2 tablets by mouth 3 (three) times daily. 04/13/20  Yes [provider]  Spacer/Aero-Holding Chambers (AEROCHAMBER MV) inhaler Use as instructed with MDI 04/14/23  Yes Desai, Nikita S, MD  vancomycin  (VANCOCIN ) 125 MG capsule Take 1 capsule (125 mg total) by mouth in the morning and at bedtime. 09/26/23  Yes Calone, Gregory D, FNP   fentaNYL  (DURAGESIC ) 75 MCG/HR Place 1 patch onto the skin every other day. G89.21-  04/21/24- can refill Patient not taking: Reported on 03/07/2024 03/06/24   Lovorn, Megan, MD  morphine  (MSIR) 15 MG tablet Take 1 tablet (15 mg total) by mouth every 3 (three) hours as needed for severe pain (pain score 7-10). G89.21- for chronic pain due to trauma- can fill 04/21/24 Patient not taking: Reported on 03/07/2024 03/06/24   Lovorn, Megan, MD  naloxone  (NARCAN ) nasal spray 4 mg/0.1 mL TO USE IF PT DEVELOPS UNCONSCIOUSNESS OR CONFUSION THAT FAMILY THINKS IS RELATED TO OPIOIDS. Patient not taking: Reported on 03/07/2024 02/27/24   Lovorn, Megan, MD  promethazine  (PHENERGAN ) 25 MG tablet Take 1 tablet (25 mg total) by mouth every 6 (six) hours as needed for nausea or vomiting. Can take EITHER 12.5 or 25 mg 3x/day as needed for persistent nausea/dry heaves Patient not taking: Reported on 03/07/2024 03/06/24   Cornelio Bouchard, MD    Physical Exam: Vitals:   03/07/24 1930 03/07/24 1945 03/07/24 2223 03/07/24 2233  BP: 111/86 122/85  (!) 139/100  Pulse: 97 95  91  Resp: 19 16  (!) 21  Temp:   97.7 F (36.5 C)   TempSrc:   Oral   SpO2: 98% 95%  97%  Height:        Physical Exam Vitals reviewed.  Constitutional:      Appearance: He is ill-appearing.  HENT:     Head: Normocephalic and atraumatic.  Eyes:     Comments: Unable to examine at this time as patient had his eyes closed  Cardiovascular:     Rate and Rhythm: Normal rate and regular rhythm.     Heart sounds: Normal heart sounds.  Pulmonary:  Effort: Pulmonary effort is normal. No respiratory distress.     Breath sounds: Normal breath sounds.  Abdominal:     General: Bowel sounds are normal.     Palpations: Abdomen is soft.     Tenderness: There is no abdominal tenderness. There is no guarding.     Comments: Brown stool noted in colostomy bag  Musculoskeletal:     Right lower leg: No edema.     Left lower leg: No edema.  Skin:     General: Skin is warm and dry.  Neurological:     General: No focal deficit present.     Mental Status: He is alert and oriented to person, place, and time.     Labs on Admission: I have personally reviewed following labs and imaging studies  CBC: Recent Labs  Lab 03/07/24 1946  WBC 8.5  HGB 13.6  HCT 41.8  MCV 91.9  PLT 295   Basic Metabolic Panel: Recent Labs  Lab 03/07/24 1946  NA 136  K 2.6*  CL 88*  CO2 31  GLUCOSE 106*  BUN 12  CREATININE 0.80  CALCIUM  10.6*  MG 1.7   GFR: CrCl cannot be calculated (Unknown ideal weight.). Liver Function Tests: Recent Labs  Lab 03/07/24 1946  AST 27  ALT 25  ALKPHOS 78  BILITOT 1.0  PROT 7.3  ALBUMIN  3.4*   No results for input(s): LIPASE, AMYLASE in the last 168 hours. No results for input(s): AMMONIA in the last 168 hours. Coagulation Profile: No results for input(s): INR, PROTIME in the last 168 hours. Cardiac Enzymes: No results for input(s): CKTOTAL, CKMB, CKMBINDEX, TROPONINI in the last 168 hours. BNP (last 3 results) No results for input(s): PROBNP in the last 8760 hours. HbA1C: No results for input(s): HGBA1C in the last 72 hours. CBG: Recent Labs  Lab 03/07/24 1841  GLUCAP 107*   Lipid Profile: No results for input(s): CHOL, HDL, LDLCALC, TRIG, CHOLHDL, LDLDIRECT in the last 72 hours. Thyroid Function Tests: No results for input(s): TSH, T4TOTAL, FREET4, T3FREE, THYROIDAB in the last 72 hours. Anemia Panel: No results for input(s): VITAMINB12, FOLATE, FERRITIN, TIBC, IRON, RETICCTPCT in the last 72 hours. Urine analysis:    Component Value Date/Time   COLORURINE YELLOW 10/14/2023 1938   APPEARANCEUR CLOUDY (A) 10/14/2023 1938   LABSPEC 1.010 10/14/2023 1938   PHURINE 5.0 10/14/2023 1938   GLUCOSEU NEGATIVE 10/14/2023 1938   HGBUR LARGE (A) 10/14/2023 1938   BILIRUBINUR NEGATIVE 10/14/2023 1938   KETONESUR NEGATIVE 10/14/2023 1938    PROTEINUR 100 (A) 10/14/2023 1938   NITRITE NEGATIVE 10/14/2023 1938   LEUKOCYTESUR LARGE (A) 10/14/2023 1938    Radiological Exams on Admission: DG Chest 2 View Result Date: 03/07/2024 CLINICAL DATA:  Weakness EXAM: CHEST - 2 VIEW COMPARISON:  10/14/2023 FINDINGS: Heart and mediastinal contours are within normal limits. No focal opacities or effusions. No acute bony abnormality. IMPRESSION: No active cardiopulmonary disease. Electronically Signed   By: Franky Crease M.D.   On: 03/07/2024 20:18    Assessment and Plan  Hypokalemia Borderline hypomagnesemia Electrolyte abnormalities in the setting of poor p.o. intake and diuretic use.  Monitor potassium and magnesium  levels, continue to replace as needed.  QT prolongation QTc 495 on EKG.  Does take Cipro  and Seroquel  chronically.  Continue to monitor electrolytes and replace as above.  Avoid other QT prolonging drugs if possible.  Follow-up repeat EKG in the morning.  Mild hypercalcemia Dehydration could be contributing.  Continue IV fluid hydration and monitor  labs.  Generalized weakness/fatigue In the setting of electrolyte abnormalities and dehydration.  Chest x-ray not suggestive of pneumonia.  UA pending to rule out UTI.  Abnormal EKG EKG showing T wave abnormalities inferolaterally which appear more pronounced compared to previous EKG from January 2025.  ACS less likely as patient is not endorsing chest pain.  Check troponin.  Chronic respiratory failure on 4 L supplemental oxygen  Stable, no change in oxygen  requirement from baseline.  Chronic osteomyelitis of the left hip No fever or leukocytosis.  Patient is on chronic ciprofloxacin  therapy and also chronic oral vancomycin  likely to prevent C. difficile.  Continue home meds.  Monitor QT interval closely as mentioned previously.  Chronic pain syndrome Continue home fentanyl  patch, morphine  PRN, gabapentin   Hypertension Currently normotensive.  Holding home Lasix  and  hydrochlorothiazide  at this time due to concern for dehydration/poor p.o. intake/risk of AKI.  GERD Continue home meds.  Chronic constipation Continue home meds.  Mood disorder Continue home Paxil  and Seroquel .  Monitor QT interval closely as mentioned previously.  DVT prophylaxis: Lovenox  Code Status: Full Code (discussed with the patient) Family Communication: No family available at this time. Level of care: Telemetry bed Admission status: It is my clinical opinion that referral for OBSERVATION is reasonable and necessary in this patient based on the above information provided. The aforementioned taken together are felt to place the patient at high risk for further clinical deterioration. However, it is anticipated that the patient may be medically stable for discharge from the hospital within 24 to 48 hours.  Editha Ram MD [facility]  If 7PM-7AM, please contact night-coverage [email or web address]  03/08/2024, 1:34 AM

## 2024-03-08 NOTE — Progress Notes (Signed)
 No charge note  Patient seen and examined this morning, admitted overnight, H&P reviewed and I agree with the assessment and plan.  This is a 58 year old male with polytrauma with left femoral neck fracture/degloving of the left hip into the scrotal area, complicated by compartment syndrome status post diverting colostomy, chronic indwelling Foley catheter, now wheelchair-bound, history of hypoxemia on 4 L at home, chronic osteomyelitis of the left hip on chronic oral antibiotic suppression, chronic pain syndrome, HTN, prior right upper extremity DVT no longer on anticoagulation comes into the hospital with overall general weakness, poor p.o. intake due to no appetite, also with ongoing nausea and dry heaving.  He denies any fever, chills, abdominal or chest pain, no nausea, vomiting or diarrhea.  In the ER was found to be slightly tachycardic and hypokalemic.  He was admitted to the hospital   Principal problem General weakness, fatigue -possibly in the setting of electrolyte abnormalities, dehydration.  There is no clear-cut acute infection other than his known osteomyelitis.  He has just seen PMNR MD few days ago and has been having the symptoms as well back then. -Reports ongoing nausea, poor p.o. intake for the last couple of weeks -Has been having increased hip pain, repeat MRI of the left hip to see if this is getting worse which can cause some of his very nonspecific symptoms  Active problems Hypokalemia-replenish potassium and continue to monitor  Chronic pain-continue home regimen as below   Chronic left hip osteomyelitis-MRI as above, continue chronic ciprofloxacin   History of polytrauma/compartment syndrome -status post diverting colostomy chronic suprapubic catheter, wheelchair-bound.  History of C. difficile-he is on chronic p.o. vancomycin  as well, continue  Chronic urinary retention-with suprapubic catheter, continue  Chronic hypoxic respiratory failure-continue oxygen ,  respiratory status is baseline  Mood disorder-continue home medications  Essential hypertension-hold Lasix /HCTZ due to dehydration, poor p.o. intake  Scheduled Meds:  acidophilus  1 capsule Oral Daily   budesonide -glycopyrrolate -formoterol   2 puff Inhalation BID   ciprofloxacin   500 mg Oral BID   enoxaparin  (LOVENOX ) injection  40 mg Subcutaneous Q24H   famotidine   40 mg Oral BID   [START ON 03/09/2024] fentaNYL   1 patch Transdermal Q72H   gabapentin   300 mg Oral TID   imipramine   25 mg Oral TID   linaclotide   145 mcg Oral QAC breakfast   melatonin  3 mg Oral QHS   mirabegron  ER  25 mg Oral Daily   multivitamin with minerals  1 tablet Oral Daily   oxybutynin   5 mg Oral TID   pantoprazole   40 mg Oral Daily   PARoxetine   40 mg Oral QHS   potassium chloride   40 mEq Oral Once   QUEtiapine   100 mg Oral QHS   QUEtiapine   25 mg Oral Daily   senna-docusate  2 tablet Oral TID   vancomycin   125 mg Oral BID   Continuous Infusions:  sodium chloride  125 mL/hr at 03/08/24 0417   PRN Meds:.acetaminophen  **OR** acetaminophen , albuterol , morphine , naLOXone  (NARCAN )  injection, oxybutynin   Christopher Kynard M. Trixie, MD, PhD [facility]  Between 7 am - 7 pm you can contact me via Amion (for emergencies) or Securechat (non urgent matters).  I am not available 7 pm - 7 am, please contact night coverage MD/APP via Amion

## 2024-03-08 NOTE — Plan of Care (Signed)

## 2024-03-08 NOTE — ED Notes (Signed)
 Doc at bedside currently

## 2024-03-08 NOTE — TOC Initial Note (Signed)
 Transition of Care (TOC) - Initial/Assessment Note   Spoke to patient at bedside.   Patient from home with wife and two children.   Patient has home health RN with [facility], left [facility] with [facility] a message. Will need updated orders. Patient wants to continue services with [facility].   Patient has walker, cane and wheelchair at home. Patient also has home oxygen  with [employer] 4 L  continuous.   Patient uses ambulance transportation for appointments. Patient will need ambulance transportation home at discharge. NCM confirmed address.   [facility] [place]   Patient receives ostomy and catheter supplies through [facility] through his [place] [employer]  Patient Details  Name: Christopher [name] MRN: [id number] Date of Birth: 1966/04/24  Transition of Care [facility]) CM/SW Contact:    Stephane Powell Jansky, RN Phone Number: 03/08/2024, 9:07 AM  Clinical Narrative:                   Expected Discharge Plan: Home w Home Health Services Barriers to Discharge: Continued Medical Work up   Patient Goals and CMS Choice Patient states their goals for this hospitalization and ongoing recovery are:: to return to home CMS Medicare.gov Compare Post Acute Care list provided to:: Patient Choice offered to / list presented to : Patient      Expected Discharge Plan and Services   Discharge Planning Services: CM Consult Post Acute Care Choice: Home Health Living arrangements for the past 2 months: Single Family Home                 DME Arranged: N/A DME Agency: NA       HH Arranged: RN HH Agency: [facility]) Date HH Agency Contacted: 03/08/24 Time HH Agency Contacted: [phone] Representative spoke with at [facility] Agency: Artavia left message  Prior Living Arrangements/Services Living arrangements for the past 2 months: Single Family Home Lives with:: Spouse, Adult Children Patient language and need for interpreter reviewed:: Yes Do you feel safe  going back to the place where you live?: Yes      Need for Family Participation in Patient Care: Yes (Comment) Care giver support system in place?: Yes (comment) Current home services: DME, Home RN Criminal Activity/Legal Involvement Pertinent to Current Situation/Hospitalization: No - Comment as needed  Activities of Daily Living      Permission Sought/Granted Permission sought to share information with : Case Manager Permission granted to share information with : Yes, Verbal Permission Granted     Permission granted to share info w AGENCY: [facility]        Emotional Assessment Appearance:: Appears stated age Attitude/Demeanor/Rapport: Engaged Affect (typically observed): Appropriate Orientation: : Oriented to Self, Oriented to Place, Oriented to  Time, Oriented to Situation Alcohol / Substance Use: Not Applicable Psych Involvement: No (comment)  Admission diagnosis:  Dehydration [E86.0] Hypokalemia [E87.6] Hypomagnesemia [E83.42] Patient Active Problem List   Diagnosis Date Noted   QT prolongation 03/08/2024   Hypercalcemia 03/08/2024   Hypokalemia 03/07/2024   Acute encephalopathy 10/14/2023   UTI (urinary tract infection) 10/14/2023   Dyslipidemia 05/05/2023   Metabolic syndrome 05/04/2023   At risk for Clostridium difficile infection 04/10/2023   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   At risk for complication of anesthesia 10/29/2022   Therapeutic drug monitoring 10/27/2022   Bladder spasms 10/03/2022   Leg edema 09/30/2022   Bilateral inguinal hernia without obstruction or gangrene 07/23/2022   Encounter for care related to feeding tube 07/23/2022   Irritant contact dermatitis  associated with fecal stoma 07/23/2022   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   Memory deficits 01/12/2022   Hypnagogic jerks 09/22/2021   Neuromuscular weakness (HCC) 06/24/2021   Depression, major, single episode, moderate (HCC) 06/24/2021   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/2021   Chronic osteomyelitis of hip (HCC) 11/22/2020   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/22/2020   Chronic respiratory failure with hypoxia (HCC) 07/02/2020   Dysphagia 07/02/2020   Therapeutic opioid induced constipation 04/11/2020   Status post peripherally inserted central catheter (PICC) central line placement 03/17/2020   Skin-picking disorder 03/12/2020   Recurrent pain of right knee 11/08/2019   Essential hypertension 11/05/2019   Myofascial pain dysfunction syndrome 08/05/2019   Complicated urinary tract infection 05/30/2019   Wound infection, posttraumatic 02/28/2019   Hydronephrosis concurrent with and due to calculi of kidney and ureter 02/28/2019   GERD (gastroesophageal reflux disease) 02/28/2019   Suprapubic catheter (HCC) 02/28/2019   Anxiety 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 01/29/2019   Chronic pain due to trauma 01/29/2019   Neuropathic pain 01/29/2019   Colostomy in place [facility]) 01/14/2019   Insomnia 01/14/2019   Current use of proton pump inhibitor 01/14/2019   Wound of buttock 12/28/2018   Anxiety and depression 12/17/2018   Generalized weakness 11/23/2018   Chronic pain syndrome    Multiple traumatic injuries    Pressure injury of skin 08/13/2018   Urethral injury 07/13/2018   PCP:  Kennyth Worth HERO, MD Pharmacy:   [phone] [place] [place] - [address] [place] [address] Phone: [phone] Fax: [phone]  [facility] [address] [place] [address] Phone: [phone] Fax: [phone]     Social Drivers of Health (SDOH) Social History: SDOH Screenings   Food Insecurity: No Food Insecurity (10/15/2023)  Housing: Low Risk  (10/15/2023)  Transportation Needs: No Transportation Needs (10/15/2023)  Utilities: Not At Risk (10/15/2023)  Depression (PHQ2-9): Low Risk  (03/06/2024)  Financial Resource Strain: Low Risk  (03/01/2019)  Physical Activity: Inactive (03/01/2019)   Stress: Stress Concern Present (03/01/2019)  Tobacco Use: Medium Risk (03/07/2024)   SDOH Interventions:     Readmission Risk Interventions    10/16/2023    3:13 PM  Readmission Risk Prevention Plan  Transportation Screening Complete  PCP or Specialist Appt within 5-7 Days Complete  Home Care Screening Complete

## 2024-03-08 NOTE — Telephone Encounter (Signed)
 Connor from [facility] lab called and wanted to make sure someone picked up pts oral drug screen yesterday

## 2024-03-08 NOTE — Progress Notes (Signed)
 Patient arrived to [phone] with two techs from MRI scan. Scan was unable to be performed d/t patient experiencing an anxiety attack while he was there. Patient was tachypneic on arrival, vitals checked and patient was tachycardic at 112, RR 20, vitals otherwise are within range. MD Gherghe notified and a new order for one time PRN ativan  placed for MRI. Patient now sitting on edge of bed with bed alarm on, bed in lowest position, and call light within reach.

## 2024-03-08 NOTE — Consult Note (Addendum)
 WOC Nurse ostomy consult note Stoma type/location: Colostomy on LLQ Stomal assessment/size: Not able to assess. Pt is using a ostomy bag for Coloplast. Explained that we don't have this brand at the hospital, and he is allow to bring some from home. I shown the similar [employer] bag and just in case. Ostomy pouching: the nurse can use, if the pt not bring his own supplies, 1 piece #[id number] and ring [id number].  Note the ostomy is in a apron abdomen, the 2 piece pouch will not be appropriate in this case. Education provided: pt is well educated. Enrolled patient in [employer] DC program: No   Bed nurse instructions: - Cleanse the skin with saline and soft wash cloth or gauze, pat dry the peri-ostomy skin. - Cut the barrier as the ostomy size and shape. - Around the cut on the barrier, applied the ring, stretching and modeling. - Take the plastic protection off, apply to his skin, take the drape protection off, adapting in his the abdomen. - Close the lock in roll system. - Empty when is 1/3 full or 1/2 full. - Change the pouch 2 times per week or if is leaking.  WOC team will not plan to follow further. Please reconsult if further assistance is needed. Thank-you,  Lela Holm RN, CNS, [employer], MSN.  (Phone [phone])

## 2024-03-08 NOTE — Progress Notes (Signed)
 Patient is to go for an MRI scan, patient was asked prior to leaving if there is any concern for the MRI scan and if any anxiety medication needed to be given prior to going. Patient stated No, I'm good. I have had it done before. Patient was connected to oxygen  tank for transfer, 2L nasal cannula. Patient transferred himself to wheelchair.

## 2024-03-09 ENCOUNTER — Observation Stay (HOSPITAL_COMMUNITY): Payer: PRIVATE HEALTH INSURANCE

## 2024-03-09 DIAGNOSIS — E876 Hypokalemia: Secondary | ICD-10-CM | POA: Diagnosis not present

## 2024-03-09 LAB — COMPREHENSIVE METABOLIC PANEL WITH GFR
ALT: 28 U/L (ref 0–44)
AST: 34 U/L (ref 15–41)
Albumin: 2.8 g/dL — ABNORMAL LOW (ref 3.5–5.0)
Alkaline Phosphatase: 68 U/L (ref 38–126)
Anion gap: 9 (ref 5–15)
BUN: 8 mg/dL (ref 6–20)
CO2: 28 mmol/L (ref 22–32)
Calcium: 8.8 mg/dL — ABNORMAL LOW (ref 8.9–10.3)
Chloride: 99 mmol/L (ref 98–111)
Creatinine, Ser: 0.74 mg/dL (ref 0.61–1.24)
GFR, Estimated: >60 mL/min (ref >60)
Glucose, Bld: 92 mg/dL (ref 70–99)
Potassium: 2.9 mmol/L — ABNORMAL LOW (ref 3.5–5.1)
Sodium: 136 mmol/L (ref 135–145)
Total Bilirubin: 0.6 mg/dL (ref 0.0–1.2)
Total Protein: 6.2 g/dL — ABNORMAL LOW (ref 6.5–8.1)

## 2024-03-09 LAB — CBC
HCT: 36.5 % — ABNORMAL LOW (ref 39.0–52.0)
Hemoglobin: 11.9 g/dL — ABNORMAL LOW (ref 13.0–17.0)
MCH: 30.2 pg (ref 26.0–34.0)
MCHC: 32.6 g/dL (ref 30.0–36.0)
MCV: 92.6 fL (ref 80.0–100.0)
Platelets: 234 K/uL (ref 150–400)
RBC: 3.94 MIL/uL — ABNORMAL LOW (ref 4.22–5.81)
RDW: 12.4 % (ref 11.5–15.5)
WBC: 6.1 K/uL (ref 4.0–10.5)
nRBC: 0 % (ref 0.0–0.2)

## 2024-03-09 LAB — PHOSPHORUS: Phosphorus: 2.8 mg/dL (ref 2.5–4.6)

## 2024-03-09 LAB — MAGNESIUM: Magnesium: 1.9 mg/dL (ref 1.7–2.4)

## 2024-03-09 MED ORDER — GADOBUTROL 1 MMOL/ML IV SOLN
9.0000 mL | Freq: Once | INTRAVENOUS | Status: AC | PRN
  Administered 2024-03-09: 9 mL via INTRAVENOUS

## 2024-03-09 MED ORDER — POTASSIUM CHLORIDE CRYS ER 20 MEQ PO TBCR
40.0000 meq | EXTENDED_RELEASE_TABLET | ORAL | Status: AC
  Administered 2024-03-09 (×2): 40 meq via ORAL
  Filled 2024-03-09 (×2): qty 2

## 2024-03-09 NOTE — Progress Notes (Signed)
 PROGRESS NOTE  Christopher [name] FMW:[id number] DOB: [birth year] DOA: 03/07/2024 PCP: Kennyth Worth HERO, MD   LOS: 0 days   Brief Narrative / Interim history: 58 year old male with polytrauma with left femoral neck fracture/degloving of the left hip into the scrotal area, complicated by compartment syndrome status post diverting colostomy, chronic indwelling Foley catheter, now wheelchair-bound, history of hypoxemia on 4 L at home, chronic osteomyelitis of the left hip on chronic oral antibiotic suppression, chronic pain syndrome, HTN, prior right upper extremity DVT no longer on anticoagulation comes into the hospital with overall general weakness, poor p.o. intake due to no appetite, also with ongoing nausea and dry heaving.  He denies any fever, chills, abdominal or chest pain, no nausea, vomiting or diarrhea.  In the ER was found to be slightly tachycardic and hypokalemic.  He was admitted to the hospital   Subjective / 24h Interval events: Feels a little bit stronger, complains of his hip hurting worse than normal.  Feels like his appetite has picked up a little bit today as well  Assesement and Plan: Principal problem General weakness, fatigue -possibly in the setting of electrolyte abnormalities, dehydration.  There is no clear-cut acute infection other than his known osteomyelitis.  He has just seen PMNR MD few days ago and has been having the symptoms as well back then. -Reports ongoing nausea, poor p.o. intake for the last couple of weeks, today this seems to be better -Has been having increased hip pain, repeat MRI of the hip pending   Active problems Severe hypokalemia-continue to replace, remains significantly low at 2.9 today   Chronic pain-continue home regimen as below    Chronic left hip osteomyelitis-MRI as above pending, continue chronic ciprofloxacin    History of polytrauma/compartment syndrome -status post diverting colostomy chronic suprapubic catheter,  wheelchair-bound.   History of C. difficile-he is on chronic p.o. vancomycin  as well, continue   Chronic urinary retention-with suprapubic catheter, continue   Chronic hypoxic respiratory failure-continue oxygen , respiratory status is baseline   Mood disorder-continue home medications   Essential hypertension-hold Lasix /HCTZ due to dehydration, poor p.o. intake  Scheduled Meds:  acidophilus  1 capsule Oral Daily   budesonide -glycopyrrolate -formoterol   2 puff Inhalation BID   ciprofloxacin   500 mg Oral BID   enoxaparin  (LOVENOX ) injection  40 mg Subcutaneous Q24H   famotidine   40 mg Oral BID   fentaNYL   1 patch Transdermal Q48H   gabapentin   300 mg Oral TID   imipramine   25 mg Oral TID   linaclotide   145 mcg Oral QAC breakfast   melatonin  3 mg Oral QHS   mirabegron  ER  25 mg Oral Daily   multivitamin with minerals  1 tablet Oral Daily   oxybutynin   5 mg Oral TID   pantoprazole   40 mg Oral Daily   PARoxetine   40 mg Oral QHS   potassium chloride   40 mEq Oral Q2H   QUEtiapine   100 mg Oral QHS   QUEtiapine   25 mg Oral Daily   senna-docusate  2 tablet Oral TID   vancomycin   125 mg Oral BID   Continuous Infusions: PRN Meds:.acetaminophen  **OR** acetaminophen , albuterol , LORazepam , morphine , naLOXone  (NARCAN )  injection, oxybutynin   Current Outpatient Medications  Medication Instructions   albuterol  (VENTOLIN  HFA) 108 (90 Base) MCG/ACT inhaler INHALE 2 PUFFS BY MOUTH EVERY 6 HOURS AS NEEDED FOR WHEEZE OR SHORTNESS OF BREATH   budesonide -glycopyrrolate -formoterol  (BREZTRI  AEROSPHERE) 160-9-4.8 MCG/ACT AERO inhaler 2 puffs, Inhalation, 2 times daily   ciprofloxacin  (CIPRO ) 500 mg, Oral,  2 times daily   famotidine  (PEPCID ) 40 MG tablet TAKE 1 TABLET BY MOUTH TWICE A DAY   fentaNYL  (DURAGESIC ) 75 MCG/HR 1 patch, Transdermal, Every 48 hours, G89.21- due 03/24/24   fentaNYL  (DURAGESIC ) 75 MCG/HR 1 patch, Transdermal, Every 48 hours, G89.21-  04/21/24- can refill   furosemide  (LASIX ) 40  mg, Oral, Daily   gabapentin  (NEURONTIN ) 300 mg, Oral, 3 times daily   Gemtesa 75 mg, Oral, Daily   hydrochlorothiazide  (HYDRODIURIL ) 12.5 mg, Oral, Daily   imipramine  (TOFRANIL ) 25 mg, Oral, 3 times daily, For bladder spasms   LINZESS  72 MCG capsule TAKE 2 CAPSULES (145 MCG TOTAL) BY MOUTH DAILY BEFORE BREAKFAST.   melatonin ([facility] MELATONIN) 3 mg, Oral, Daily at bedtime   morphine  (MSIR) 15 mg, Oral, Every  3 hours PRN   morphine  (MSIR) 15 mg, Oral, Every  3 hours PRN, G89.21- for chronic pain due to trauma- can fill 04/21/24   Multiple Vitamin (MULTIVITAMIN WITH MINERALS) TABS tablet 1 tablet, Oral, Daily   naloxone  (NARCAN ) nasal spray 4 mg/0.1 mL TO USE IF PT DEVELOPS UNCONSCIOUSNESS OR CONFUSION THAT FAMILY THINKS IS RELATED TO OPIOIDS.   oxybutynin  (DITROPAN ) 5 mg, Oral, See admin instructions, Take 5 mg by mouth three times a day and an additional 5 mg once daily as needed for urinary urgency   pantoprazole  (PROTONIX ) 40 mg, Oral, Daily   PARoxetine  (PAXIL ) 40 MG tablet TAKE 1 TABLET BY MOUTH EVERYDAY AT BEDTIME   potassium chloride  SA (KLOR-CON  M) 20 MEQ tablet 20 mEq, Oral, Daily   Probiotic Product (PROBIOTIC COLON SUPPORT) CAPS 1 capsule, Oral, Daily   promethazine  (PHENERGAN ) 12.5 mg, Oral, Every 6 hours PRN   promethazine  (PHENERGAN ) 25 mg, Oral, Every 6 hours PRN, Can take EITHER 12.5 or 25 mg 3x/day as needed for persistent nausea/dry heaves   QUEtiapine  (SEROQUEL ) 50 mg, Oral, 3 times daily, 50 mg in AM and 100 mg at bedtime- for hallucinations-   senna-docusate (SENOKOT-S) 8.6-50 MG tablet 2 tablets, Oral, 3 times daily   Spacer/Aero-Holding Chambers (AEROCHAMBER MV) inhaler Use as instructed with MDI   vancomycin  (VANCOCIN ) 125 mg, Oral, 2 times daily    Diet Orders (From admission, onward)     Start     Ordered   03/08/24 0250  Diet Heart Room service appropriate? Yes; Fluid consistency: Thin  Diet effective now       Question Answer Comment  Room service appropriate?  Yes   Fluid consistency: Thin      03/08/24 0253            DVT prophylaxis: enoxaparin  (LOVENOX ) injection 40 mg Start: 03/08/24 1400   Lab Results  Component Value Date   PLT 234 03/09/2024      Code Status: Full Code  Family Communication: Wife over the phone  Status is: Observation The patient will require care spanning > 2 midnights and should be moved to inpatient because: Persistent weakness, hypokalemia   Level of care: Telemetry Medical  Consultants:  None  Objective: Vitals:   03/08/24 2118 03/09/24 0412 03/09/24 0757 03/09/24 0845  BP:  126/85  134/81  Pulse:  93  94  Resp:  17    Temp:  98 F (36.7 C)    TempSrc:  Oral    SpO2: 100% 99% 98% 100%  Height:        Intake/Output Summary (Last 24 hours) at 03/09/2024 0950 Last data filed at 03/09/2024 0500 Gross per 24 hour  Intake --  Output 1950  ml  Net -1950 ml   Wt Readings from Last 3 Encounters:  11/20/23 88.9 kg  06/22/23 97.5 kg  06/12/23 88 kg    Examination:  Constitutional: NAD Eyes: lids and conjunctivae normal, no scleral icterus ENMT: mmm Neck: normal, supple Respiratory: clear to auscultation bilaterally, no wheezing, no crackles. Normal respiratory effort.  Cardiovascular: Regular rate and rhythm, no murmurs / rubs / gallops. No LE edema. Abdomen: soft, no distention, no tenderness. Bowel sounds positive.  Skin: no rashes  Data Reviewed: I have independently reviewed following labs and imaging studies   CBC Recent Labs  Lab 03/07/24 1946 03/09/24 0518  WBC 8.5 6.1  HGB 13.6 11.9*  HCT 41.8 36.5*  PLT 295 234  MCV 91.9 92.6  MCH 29.9 30.2  MCHC 32.5 32.6  RDW 12.3 12.4    Recent Labs  Lab 03/07/24 1946 03/08/24 0331 03/09/24 0518  NA 136 135 136  K 2.6* 3.1* 2.9*  CL 88* 93* 99  CO2 31 29 28   GLUCOSE 106* 98 92  BUN 12 11 8   CREATININE 0.80 0.66 0.74  CALCIUM  10.6* 9.7 8.8*  AST 27  --  34  ALT 25  --  28  ALKPHOS 78  --  68  BILITOT 1.0  --  0.6   ALBUMIN  3.4*  --  2.8*  MG 1.7 2.0 1.9  CRP 14.4*  --   --     ------------------------------------------------------------------------------------------------------------------ No results for input(s): CHOL, HDL, LDLCALC, TRIG, CHOLHDL, LDLDIRECT in the last 72 hours.  Lab Results  Component Value Date   HGBA1C 5.5 05/04/2023   ------------------------------------------------------------------------------------------------------------------ No results for input(s): TSH, T4TOTAL, T3FREE, THYROIDAB in the last 72 hours.  Invalid input(s): FREET3  Cardiac Enzymes No results for input(s): CKMB, TROPONINI, MYOGLOBIN in the last 168 hours.  Invalid input(s): CK ------------------------------------------------------------------------------------------------------------------    Component Value Date/Time   BNP 56.2 10/14/2020 0830    CBG: Recent Labs  Lab 03/07/24 1841  GLUCAP 107*    No results found for this or any previous visit (from the past 240 hours).   Radiology Studies: No results found.   Nilda Fendt, MD, PhD [facility]  Between 7 am - 7 pm I am available, please contact me via Amion (for emergencies) or Securechat (non urgent messages)  Between 7 pm - 7 am I am not available, please contact night coverage MD/APP via Amion
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
## 2024-03-09 NOTE — Plan of Care (Signed)
   Problem: Education: Goal: Knowledge of General Education information will improve Description Including pain rating scale, medication(s)/side effects and non-pharmacologic comfort measures Outcome: Progressing

## 2024-03-10 DIAGNOSIS — E876 Hypokalemia: Secondary | ICD-10-CM | POA: Diagnosis not present

## 2024-03-10 LAB — COMPREHENSIVE METABOLIC PANEL WITH GFR
ALT: 32 U/L (ref 0–44)
AST: 37 U/L (ref 15–41)
Albumin: 2.7 g/dL — ABNORMAL LOW (ref 3.5–5.0)
Alkaline Phosphatase: 66 U/L (ref 38–126)
Anion gap: 6 (ref 5–15)
BUN: 8 mg/dL (ref 6–20)
CO2: 26 mmol/L (ref 22–32)
Calcium: 9 mg/dL (ref 8.9–10.3)
Chloride: 106 mmol/L (ref 98–111)
Creatinine, Ser: 0.62 mg/dL (ref 0.61–1.24)
GFR, Estimated: >60 mL/min (ref >60)
Glucose, Bld: 90 mg/dL (ref 70–99)
Potassium: 3.4 mmol/L — ABNORMAL LOW (ref 3.5–5.1)
Sodium: 138 mmol/L (ref 135–145)
Total Bilirubin: 0.4 mg/dL (ref 0.0–1.2)
Total Protein: 6.1 g/dL — ABNORMAL LOW (ref 6.5–8.1)

## 2024-03-10 LAB — CBC
HCT: 35.3 % — ABNORMAL LOW (ref 39.0–52.0)
Hemoglobin: 11.5 g/dL — ABNORMAL LOW (ref 13.0–17.0)
MCH: 30.4 pg (ref 26.0–34.0)
MCHC: 32.6 g/dL (ref 30.0–36.0)
MCV: 93.4 fL (ref 80.0–100.0)
Platelets: 246 K/uL (ref 150–400)
RBC: 3.78 MIL/uL — ABNORMAL LOW (ref 4.22–5.81)
RDW: 12.5 % (ref 11.5–15.5)
WBC: 5.6 K/uL (ref 4.0–10.5)
nRBC: 0 % (ref 0.0–0.2)

## 2024-03-10 LAB — MAGNESIUM: Magnesium: 1.8 mg/dL (ref 1.7–2.4)

## 2024-03-10 MED ORDER — FUROSEMIDE 40 MG PO TABS: 40.0000 mg | ORAL_TABLET | Freq: Every day | ORAL | Status: AC | PRN

## 2024-03-10 MED ORDER — POTASSIUM CHLORIDE CRYS ER 20 MEQ PO TBCR
40.0000 meq | EXTENDED_RELEASE_TABLET | Freq: Once | ORAL | Status: AC
  Administered 2024-03-10: 40 meq via ORAL
  Filled 2024-03-10: qty 2
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
## 2024-03-10 NOTE — TOC Transition Note (Addendum)
 Transition of Care [facility]) - Discharge Note   Patient Details  Name: Christopher [name] MRN: [id number] Date of Birth: 22-Dec-1965  Transition of Care [facility]) CM/SW Contact:  Robynn Eileen Hoose, RN Phone Number: 03/10/2024, 9:40 AM   Clinical Narrative:   Patient is being discharged home. Message from provider that pt will need [facility] transportation home. Spoke with pt, confirmed that he does not have other transportation home and that PTAR is how he usually gets home when admitted in the hospital. PTAR transportation arranged and forms sent to unit floor.   1050: Message from floor regarding [facility] refusing to transport patient due to patient being wheelchair bound. Explained to [employer] staff that Wheelchair accessible transportation is not available on weekends. PTAR staff is waiting to hear back from their supervisor.   Final next level of care: Home w Home Health Services Barriers to Discharge: No Barriers Identified   Patient Goals and CMS Choice Patient states their goals for this hospitalization and ongoing recovery are:: to return to home CMS Medicare.gov Compare Post Acute Care list provided to:: Patient Choice offered to / list presented to : Patient      Discharge Placement                       Discharge Plan and Services Additional resources added to the After Visit Summary for     Discharge Planning Services: CM Consult Post Acute Care Choice: Home Health          DME Arranged: N/A DME Agency: NA       HH Arranged: RN HH Agency: [facility] Date HH Agency Contacted: 03/08/24 Time HH Agency Contacted: [phone] Representative spoke with at [facility] Agency: [place] left message  Social Drivers of Health (SDOH) Interventions SDOH Screenings   Food Insecurity: No Food Insecurity (03/08/2024)  Housing: Low Risk  (03/08/2024)  Transportation Needs: No Transportation Needs (03/08/2024)  Utilities: Not At Risk (03/08/2024)  Depression (PHQ2-9): Low Risk   (03/06/2024)  Financial Resource Strain: Low Risk  (03/01/2019)  Physical Activity: Inactive (03/01/2019)  Stress: Stress Concern Present (03/01/2019)  Tobacco Use: Medium Risk (03/07/2024)     Readmission Risk Interventions    10/16/2023    3:13 PM  Readmission Risk Prevention Plan  Transportation Screening Complete  PCP or Specialist Appt within 5-7 Days Complete  Home Care Screening Complete

## 2024-03-10 NOTE — Progress Notes (Signed)
 Christopher [name] to be D/C'd  per MD order.  Discussed with the patient and all questions fully answered.  VSS, Skin clean, dry and intact without evidence of skin break down, no evidence of skin tears noted.  IV catheter discontinued intact. Site without signs and symptoms of complications. Dressing and pressure applied.  An After Visit Summary was printed and given to the patient.   D/c education completed with patient/family including follow up instructions, medication list, d/c activities limitations if indicated, with other d/c instructions as indicated by MD - patient able to verbalize understanding, all questions fully answered.   Patient instructed to return to ED, call 911, or call MD for any changes in condition.   Patient to be escorted via stretcher, d/c home with PTAR.
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
## 2024-03-11 ENCOUNTER — Telehealth: Payer: Self-pay

## 2024-03-11 MED ORDER — PROMETHAZINE HCL 25 MG PO TABS: 25.0000 mg | ORAL_TABLET | Freq: Four times a day (QID) | ORAL | 5 refills | Status: AC | PRN

## 2024-03-11 NOTE — Telephone Encounter (Signed)
 Message from pharmacy: Promethazine  25 mg has no image please resend.

## 2024-03-11 NOTE — Telephone Encounter (Signed)
 Per pharmacy worker's comp will cover the Promethazine  12.5 mg. They have notified the patient for pick-up.

## 2024-03-12 ENCOUNTER — Telehealth: Payer: Self-pay | Admitting: *Deleted

## 2024-03-12 NOTE — Telephone Encounter (Signed)
 Copied from CRM [id number]. Topic: General - Other >> Mar 12, 2024 10:38 AM [place] A wrote: Reason for CRM: [facility]  will resume care on 03/13/24  Noted  [facility]

## 2024-03-24 ENCOUNTER — Other Ambulatory Visit: Payer: Self-pay | Admitting: Family Medicine

## 2024-03-27 ENCOUNTER — Telehealth: Payer: Self-pay | Admitting: Family Medicine

## 2024-03-27 NOTE — Telephone Encounter (Signed)
 [facility] faxed Home Health Certificate (Order [place] [id number]), to be filled out by provider. Patient requested to send it back via Fax within ASAP. Document is located in providers tray at front office.Please advise at [phone].

## 2024-03-28 DIAGNOSIS — E43 Unspecified severe protein-calorie malnutrition: Secondary | ICD-10-CM

## 2024-03-28 DIAGNOSIS — Z433 Encounter for attention to colostomy: Secondary | ICD-10-CM

## 2024-03-28 DIAGNOSIS — G894 Chronic pain syndrome: Secondary | ICD-10-CM

## 2024-03-28 DIAGNOSIS — M86652 Other chronic osteomyelitis, left thigh: Secondary | ICD-10-CM | POA: Diagnosis not present

## 2024-03-28 DIAGNOSIS — I700 Atherosclerosis of aorta: Secondary | ICD-10-CM | POA: Diagnosis not present

## 2024-03-28 DIAGNOSIS — G8220 Paraplegia, unspecified: Secondary | ICD-10-CM | POA: Diagnosis not present

## 2024-03-28 DIAGNOSIS — J9611 Chronic respiratory failure with hypoxia: Secondary | ICD-10-CM

## 2024-03-28 DIAGNOSIS — Z435 Encounter for attention to cystostomy: Secondary | ICD-10-CM

## 2024-03-28 DIAGNOSIS — F419 Anxiety disorder, unspecified: Secondary | ICD-10-CM

## 2024-03-28 DIAGNOSIS — E785 Hyperlipidemia, unspecified: Secondary | ICD-10-CM

## 2024-03-28 DIAGNOSIS — I10 Essential (primary) hypertension: Secondary | ICD-10-CM | POA: Diagnosis not present

## 2024-03-28 DIAGNOSIS — F32A Depression, unspecified: Secondary | ICD-10-CM

## 2024-03-28 NOTE — Telephone Encounter (Signed)
 Placed in PCP office to be reviewed

## 2024-04-01 NOTE — Telephone Encounter (Signed)
 Form faxed and placed to be scan in patient chart

## 2024-04-08 ENCOUNTER — Ambulatory Visit: Payer: PRIVATE HEALTH INSURANCE | Admitting: Family

## 2024-04-16 ENCOUNTER — Encounter: Payer: PRIVATE HEALTH INSURANCE | Admitting: Psychology

## 2024-04-17 ENCOUNTER — Encounter: Payer: Self-pay | Admitting: Physical Medicine and Rehabilitation

## 2024-04-17 ENCOUNTER — Encounter
Payer: PRIVATE HEALTH INSURANCE | Attending: Physical Medicine and Rehabilitation | Admitting: Physical Medicine and Rehabilitation

## 2024-04-17 VITALS — BP 121/83 | HR 92

## 2024-04-17 DIAGNOSIS — Z933 Colostomy status: Secondary | ICD-10-CM | POA: Insufficient documentation

## 2024-04-17 DIAGNOSIS — Z9359 Other cystostomy status: Secondary | ICD-10-CM | POA: Insufficient documentation

## 2024-04-17 DIAGNOSIS — R413 Other amnesia: Secondary | ICD-10-CM | POA: Diagnosis present

## 2024-04-17 DIAGNOSIS — N319 Neuromuscular dysfunction of bladder, unspecified: Secondary | ICD-10-CM | POA: Insufficient documentation

## 2024-04-17 DIAGNOSIS — F321 Major depressive disorder, single episode, moderate: Secondary | ICD-10-CM | POA: Insufficient documentation

## 2024-04-17 DIAGNOSIS — M8668 Other chronic osteomyelitis, other site: Secondary | ICD-10-CM | POA: Diagnosis not present

## 2024-04-17 DIAGNOSIS — R531 Weakness: Secondary | ICD-10-CM | POA: Diagnosis not present

## 2024-04-17 DIAGNOSIS — G8921 Chronic pain due to trauma: Secondary | ICD-10-CM | POA: Insufficient documentation

## 2024-04-17 NOTE — Progress Notes (Signed)
 Subjective:    Patient ID: Christopher [name], male    DOB: 03/06/1966, 58 y.o.   MRN: [id number]  HPI Pt is a 58 yr old male with hx of Multitrauma- causing L femoral neck fx, degloving of L hip to going/scrotum, bladder neck trauma- got SPC, developed compartment syndrome -s/p surgery for that; also diverting colostomy, skin grafts, and s/p trach and PEG- PEG is out. 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 S/P screw removal and on IV ABX for L hip osteomyelitis. 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  Has R severe ulnar neuropathy as well. O2 3L full time.   Here for f/u on multitrauma and chronic L hip osteomyelitis with associated chronic pain.     Bladder still hurting- hurts all the way around the back.  Was done 3 weeks ago- Dr Markham- [facility]-  Couldn't get where he wanted to get.  Gave him  a lot of injections.   Wakes up in the middle of night hurting and feels like needs ot pee real bad.  Doesn't have UTI Sx's- mainly spasms Sx's.  A lot of sediment lately- and was Stirred up- but slacked off again.  Wakes Wife up in middle of night 11-2 am for bladder spasms meds.   Wants to cut it out- the bladder!   Most of pain currently is bladder as well as achiness form cold weather.  Missed Dr Corina appt yesterday due to pain.  Had to drag him in for appt.   Scared to drink anything- because it goes right to the bladder- but  Hasn't been giving Lasix  since not drinking enough- still giving hydrochlorothiazide   for BP.   Cannot get comfortable.  W/C is going on 6 years.  But looks in decent condition- since doesn't use much!  Nausea pretty prevalent- still- had to get 2 of 12.5 mg this AM due ot such severe nausea.   Has lost some weight- has leveled belly not as distended- and helped colostomy and not pulling.   Olanzapine  caused weight gain and back off it. Eating not as much- feels full per pt if eats too much   Still in process getting neuropsychiatrist- getting payments worked out.  Been without Olanzapine  and Ativan , been OK. Hasn't been violent- but is taking Seroquel  50 mg TID.   Wants to grow beard out and hair.   Not doing PT due to pain.   Pain Inventory Average Pain 9 Pain Right Now 9 My pain is sharp, burning, dull, stabbing, tingling, and aching  In the last 24 hours, has pain interfered with the following? General activity 10 Relation with others 10 Enjoyment of life 10 What TIME of day is your pain at its worst? morning , daytime, evening, and night Sleep (in general) Poor  Pain is worse with: walking, bending, sitting, inactivity, standing, and some activites Pain improves with: medication Relief from Meds: 5  Family History  Problem Relation Age of Onset   Breast cancer Mother  with mets to the bones   Social History   Socioeconomic History   Marital status: Married    Spouse name: Not on file   Number of children: Not on file   Years of education: Not on file   Highest education level: Not on file  Occupational History   Occupation: Disable  Tobacco Use   Smoking status: Former    Current packs/day: 0.00    Average packs/day: 1 pack/day for 20.0 years (20.0 ttl pk-yrs)    Types: Cigarettes    Start date: 07/10/1998    Quit date: 07/10/2018    Years since quitting: 5.7   Smokeless tobacco: Never  Vaping Use   Vaping status: Never Used  Substance and Sexual Activity   Alcohol use: Never   Drug use: Yes    Types: Oxycodone , Fentanyl      Comment: Fentanyl  patch/oxycodone  since 06/2018   Sexual activity: Yes  Other Topics Concern   Not on file  Social History Narrative   ** Merged History Encounter **       Social Drivers of Health   Financial Resource Strain: Low Risk  (03/01/2019)   Overall Financial Resource Strain (CARDIA)    Difficulty of Paying Living Expenses: Not very hard  Food Insecurity: No Food Insecurity (03/08/2024)   Hunger Vital Sign    Worried About Running Out of Food in the Last Year: Never true    Ran Out of Food in the Last Year: Never true  Transportation Needs: No Transportation Needs (03/08/2024)   PRAPARE - Administrator, Civil Service (Medical): No    Lack of Transportation (Non-Medical): No  Physical Activity: Inactive (03/01/2019)   Exercise Vital Sign    Days of Exercise per Week: 0 days    Minutes of Exercise per Session: 0 min  Stress: Stress Concern Present (03/01/2019)   [employer] of Occupational Health - Occupational Stress Questionnaire    Feeling of Stress : Rather much  Social Connections: Not on file   Past Surgical History:  Procedure Laterality Date   APPLICATION OF A-CELL OF BACK N/A 08/06/2018   Procedure: Application Of A-Cell Of Back;  Surgeon: Lowery Estefana RAMAN, DO;  [facility];  Service: Plastics;  Laterality: N/A;   APPLICATION OF A-CELL OF EXTREMITY Left 08/06/2018   Procedure: Application Of A-Cell Of Extremity;  Surgeon: Lowery Estefana RAMAN, DO;  [facility];  Service: Plastics;  Laterality: Left;   APPLICATION OF A-CELL OF EXTREMITY Left 09/18/2019   Procedure: APPLICATION OF A-CELL OF EXTREMITY;  Surgeon: Lowery Estefana RAMAN, DO;  [facility];  Service: Plastics;  Laterality: Left;   APPLICATION OF WOUND VAC  07/12/2018   Procedure: Application Of Wound Vac to the Left Thigh and Scrotum.;  Surgeon: Kendal Franky SQUIBB, MD;  [facility];  Service: Orthopedics;;   APPLICATION OF WOUND VAC  07/10/2018   Procedure: Application Of Wound  Vac;  Surgeon: Signe Mitzie LABOR, MD;  Location[facility];  Service: General;;   BIOPSY  11/02/2022   Procedure: BIOPSY;  Surgeon: Wilhelmenia Aloha Raddle., MD;  Location: [facility];  Service: Gastroenterology;;   COLON SURGERY  2020   colostomy   COLOSTOMY N/A 07/23/2018   Procedure: COLOSTOMY;  Surgeon: Sebastian Moles, MD;  [facility] OR;  Service: General;  Laterality: N/A;   CYSTOSCOPY W/ URETERAL STENT PLACEMENT N/A 07/15/2018   Procedure: RETROGRADE URETHROGRAM;  Surgeon: Matilda Senior, MD;  Location: [facility] OR;  Service: Urology;  Laterality: N/A;  CYSTOSCOPY WITH LITHOLAPAXY N/A 05/06/2019   Procedure: CYSTOSCOPY BASKET BLADDER STONE EXTRACTION;  Surgeon: Sherrilee Belvie CROME, MD;  Location: [facility];  Service: Urology;  Laterality: N/A;  30 MINS   CYSTOSTOMY N/A 05/06/2019   Procedure: REPLACEMENT OF SUPRAPUBIC CATHETER;  Surgeon: Sherrilee Belvie CROME, MD;  Location: [facility];  Service: Urology;  Laterality: N/A;   DEBRIDEMENT AND CLOSURE WOUND Left 03/04/2019   Procedure: Excision of hip wound with placement of Acell;  Surgeon: Lowery Estefana RAMAN, DO;  [facility];  Service: Plastics;  Laterality: Left;   ESOPHAGOGASTRODUODENOSCOPY N/A 08/14/2018   Procedure: ESOPHAGOGASTRODUODENOSCOPY (EGD);  Surgeon: Sebastian Moles, MD;  [facility]: [facility] END[facility]COPY;  Service: General;  Laterality: N/A;  bedside   ESOPHAGOGASTRODUODENOSCOPY (EGD) WITH PROPOFOL  N/A 11/02/2022   Procedure: ESOPHAGOGASTRODUODENOSCOPY (EGD) WITH PROPOFOL ;  Surgeon: Wilhelmenia Aloha Raddle., MD;  Location: [facility];  Service: Gastroenterology;  Laterality: N/A;   FACIAL RECONSTRUCTION SURGERY     X 2--once as a teenager and second time in his 21's   HARDWARE REMOVAL Left 03/04/2019   Procedure: Left Hip Hardware Removal;  Surgeon: Kendal Franky SQUIBB, MD;  [facility];  Service: Orthopedics;  Laterality: Left;   HEMOSTASIS CLIP PLACEMENT  11/02/2022   Procedure: HEMOSTASIS CLIP  PLACEMENT;  Surgeon: Wilhelmenia Aloha Raddle., MD;  Location: [facility];  Service: Gastroenterology;;   HIP PINNING,CANNULATED Left 07/12/2018   Procedure: CANNULATED HIP PINNING;  Surgeon: Kendal Franky SQUIBB, MD;  [facility];  Service: Orthopedics;  Laterality: Left;   HIP SURGERY     HOLMIUM LASER APPLICATION Right 07/15/2019   Procedure: HOLMIUM LASER APPLICATION;  Surgeon: Sherrilee Belvie CROME, MD;  Location: [facility];  Service: Urology;  Laterality: Right;   HOT HEMOSTASIS N/A 11/02/2022   Procedure: HOT HEMOSTASIS (ARGON PLASMA COAGULATION/BICAP);  Surgeon: Wilhelmenia Aloha Raddle., MD;  Location: [facility];  Service: Gastroenterology;  Laterality: N/A;   I & D EXTREMITY Left 07/25/2018   Procedure: Debridement of buttock, scrotum and left leg, placement of acell and vac;  Surgeon: Lowery Estefana RAMAN, DO;  [facility];  Service: Plastics;  Laterality: Left;   I & D EXTREMITY N/A 08/06/2018   Procedure: Debridement of buttock, scrotum and left leg;  Surgeon: Lowery Estefana RAMAN, DO;  [facility];  Service: Plastics;  Laterality: N/A;   I & D EXTREMITY N/A 08/13/2018   Procedure: Debridement of buttock, scrotum and left leg, placement of acell and vac;  Surgeon: Lowery Estefana RAMAN, DO;  [facility];  Service: Plastics;  Laterality: N/A;  90 min, please   INCISION AND DRAINAGE HIP Left 09/18/2019   Procedure: IRRIGATION AND DEBRIDEMENT HIP/ PELVIS WITH WOUND VAC PLACEMENT;  Surgeon: Kendal Franky SQUIBB, MD;  [facility];  Service: Orthopedics;  Laterality: Left;   INCISION AND DRAINAGE OF WOUND N/A 07/18/2018   Procedure: Debridement of left leg, buttocks and scrotal wound with placement of acell and Flexiseal;  Surgeon: Lowery Estefana RAMAN, DO;  [facility];  Service: Plastics;  Laterality: N/A;   INCISION AND DRAINAGE OF WOUND Left 08/29/2018   Procedure: Debridement of buttock, scrotum and left leg, placement of acell and vac;  Surgeon: Lowery Estefana RAMAN,  DO;  [facility];  Service: Plastics;  Laterality: Left;  75 min, please   INCISION AND DRAINAGE OF WOUND Bilateral 10/23/2018   Procedure: DEBRIDEMENT OF BUTTOCK,SCROTUM, AND LEG WOUNDS WITH PLACEMENT OF ACELL- BILATERAL 90 MIN;  Surgeon: Lowery Estefana RAMAN, DO;  [facility];  Service: Plastics;  Laterality: Bilateral;   IR ANGIOGRAM PELVIS SELECTIVE OR SUPRASELECTIVE  07/10/2018   IR ANGIOGRAM PELVIS SELECTIVE OR SUPRASELECTIVE  07/10/2018   IR ANGIOGRAM SELECTIVE EACH ADDITIONAL VESSEL  07/10/2018   IR EMBO ART  VEN HEMORR LYMPH EXTRAV  INC GUIDE ROADMAPPING  07/10/2018   IR GASTROSTOMY TUBE MOD SED  01/21/2021   IR NEPHROSTOMY PLACEMENT LEFT  04/05/2019   IR NEPHROSTOMY PLACEMENT RIGHT  05/31/2019   IR US  GUIDE BX ASP/DRAIN  07/10/2018   IR US  GUIDE VASC ACCESS RIGHT  07/10/2018   IR VENO/EXT/UNI LEFT  07/10/2018   IRRIGATION AND DEBRIDEMENT OF WOUND WITH SPLIT THICKNESS SKIN GRAFT Left 09/19/2018   Procedure: Debridement of gluteal wound with placement of acell to left leg.;  Surgeon: Lowery Estefana RAMAN, DO;  [facility];  Service: Plastics;  Laterality: Left;  2.5 hours, please   LAPAROTOMY N/A 07/12/2018   Procedure: EXPLORATORY LAPAROTOMY;  Surgeon: Sebastian Moles, MD;  Location: [facility] OR;  Service: General;  Laterality: N/A;   LAPAROTOMY N/A 07/15/2018   Procedure: WOUND EXPLORATION; CLOSURE OF ABDOMEN;  Surgeon: Sebastian Moles, MD;  Location: [facility] OR;  Service: General;  Laterality: N/A;   LAPAROTOMY  07/10/2018   Procedure: Exploratory Laparotomy;  Surgeon: Signe Mitzie LABOR, MD;  Location: [facility] OR;  Service: General;;   MASS EXCISION Left 09/18/2019   Procedure: EXCISION UPPER LEFT INNER THIGH WOUND;  Surgeon: Lowery Estefana RAMAN, DO;  [facility];  Service: Plastics;  Laterality: Left;   NEPHROLITHOTOMY Right 07/15/2019   Procedure: NEPHROLITHOTOMY PERCUTANEOUS;  Surgeon: Sherrilee Belvie CROME, MD;  Location: [facility];  Service: Urology;  Laterality: Right;  90 MINS    PEG PLACEMENT N/A 08/14/2018   Procedure: PERCUTANEOUS ENDOSCOPIC GASTROSTOMY (PEG) PLACEMENT;  Surgeon: Sebastian Moles, MD;  Location: [facility]COPY;  Service: General;  Laterality: N/A;   PERCUTANEOUS TRACHEOSTOMY N/A 08/02/2018   Procedure: PERCUTANEOUS TRACHEOSTOMY;  Surgeon: Sebastian Moles, MD;  Location: [facility] OR;  Service: General;  Laterality: N/A;   RADIOLOGY WITH ANESTHESIA N/A 07/10/2018   Procedure: IR WITH ANESTHESIA;  Surgeon: Adele Rush, MD;  Location: [facility] OR;  Service: Radiology;  Laterality: N/A;   RADIOLOGY WITH ANESTHESIA Right 07/10/2018   Procedure: Ir With Anesthesia;  Surgeon: Adele Rush, MD;  Location: [facility] OR;  Service: Radiology;  Laterality: Right;   SAVORY DILATION N/A 11/02/2022   Procedure: SAVORY DILATION;  Surgeon: Wilhelmenia Aloha Raddle., MD;  Location: [facility];  Service: Gastroenterology;  Laterality: N/A;   SCROTAL EXPLORATION N/A 07/15/2018   Procedure: SCROTUM DEBRIDEMENT;  Surgeon: Matilda Senior, MD;  Location: [facility] OR;  Service: Urology;  Laterality: N/A;   SHOULDER SURGERY     SKIN SPLIT GRAFT Right 09/19/2018   Procedure: Skin Graft Split Thickness;  Surgeon: Lowery Estefana RAMAN, DO;  [facility];  Service: Plastics;  Laterality: Right;   SKIN SPLIT GRAFT N/A 10/03/2018   Procedure: Split thickness skin graft to gluteal area with acell placement;  Surgeon: Lowery Estefana RAMAN, DO;  [facility];  Service: Plastics;  Laterality: N/A;  3 hours, please   VACUUM ASSISTED CLOSURE CHANGE N/A 07/12/2018   Procedure: ABDOMINAL VACUUM ASSISTED CLOSURE CHANGE and abdominal washout;  Surgeon: Sebastian Moles, MD;  Location: [facility] OR;  Service: General;  Laterality: N/A;   WOUND DEBRIDEMENT Left 07/23/2018   Procedure: DEBRIDEMENT LEFT BUTTOCK  WOUND;  Surgeon: Sebastian Moles, MD;  Location: [facility] OR;  Service: General;  Laterality: Left;   WOUND EXPLORATION Left  07/10/2018   Procedure: WOUND EXPLORATION LEFT GROIN;  Surgeon: Signe Mitzie LABOR, MD;  Location: [facility] OR;   Service: General;  Laterality: Left;   Past Surgical History:  Procedure Laterality Date   APPLICATION OF A-CELL OF BACK N/A 08/06/2018   Procedure: Application Of A-Cell Of Back;  Surgeon: Lowery Estefana RAMAN, DO;  [facility];  Service: Plastics;  Laterality: N/A;   APPLICATION OF A-CELL OF EXTREMITY Left 08/06/2018   Procedure: Application Of A-Cell Of Extremity;  Surgeon: Lowery Estefana RAMAN, DO;  [facility];  Service: Plastics;  Laterality: Left;   APPLICATION OF A-CELL OF EXTREMITY Left 09/18/2019   Procedure: APPLICATION OF A-CELL OF EXTREMITY;  Surgeon: Lowery Estefana RAMAN, DO;  [facility];  Service: Plastics;  Laterality: Left;   APPLICATION OF WOUND VAC  07/12/2018   Procedure: Application Of Wound Vac to the Left Thigh and Scrotum.;  Surgeon: Kendal Franky SQUIBB, MD;  [facility];  Service: Orthopedics;;   APPLICATION OF WOUND VAC  07/10/2018   Procedure: Application Of Wound Vac;  Surgeon: Signe Mitzie LABOR, MD;  Location: [facility] OR;  Service: General;;   BIOPSY  11/02/2022   Procedure: BIOPSY;  Surgeon: Wilhelmenia Aloha Raddle., MD;  Location: [facility];  Service: Gastroenterology;;   COLON SURGERY  2020   colostomy   COLOSTOMY N/A 07/23/2018   Procedure: COLOSTOMY;  Surgeon: Sebastian Moles, MD;  Location: [facility] OR;  Service: General;  Laterality: N/A;   CYSTOSCOPY W/ URETERAL STENT PLACEMENT N/A 07/15/2018   Procedure: RETROGRADE URETHROGRAM;  Surgeon: Matilda Senior, MD;  Location: [facility] OR;  Service: Urology;  Laterality: N/A;   CYSTOSCOPY WITH LITHOLAPAXY N/A 05/06/2019   Procedure: CYSTOSCOPY BASKET BLADDER STONE EXTRACTION;  Surgeon: Sherrilee Belvie CROME, MD;  Location: [facility];  Service: Urology;  Laterality: N/A;  30 MINS   CYSTOSTOMY N/A 05/06/2019   Procedure: REPLACEMENT OF SUPRAPUBIC CATHETER;  Surgeon: Sherrilee Belvie CROME, MD;  Location: [facility];  Service: Urology;  Laterality: N/A;   DEBRIDEMENT AND CLOSURE WOUND Left  03/04/2019   Procedure: Excision of hip wound with placement of Acell;  Surgeon: Lowery Estefana RAMAN, DO;  [facility];  Service: Plastics;  Laterality: Left;   ESOPHAGOGASTRODUODENOSCOPY N/A 08/14/2018   Procedure: ESOPHAGOGASTRODUODENOSCOPY (EGD);  Surgeon: Sebastian Moles, MD;  Location: [facility] ENDOSCOPY;  Service: General;  Laterality: N/A;  bedside   ESOPHAGOGASTRODUODENOSCOPY (EGD) WITH PROPOFOL  N/A 11/02/2022   Procedure: ESOPHAGOGASTRODUODENOSCOPY (EGD) WITH PROPOFOL ;  Surgeon: Wilhelmenia Aloha Raddle., MD;  Location: [facility];  Service: Gastroenterology;  Laterality: N/A;   FACIAL RECONSTRUCTION SURGERY     X 2--once as a teenager and second time in his 48's   HARDWARE REMOVAL Left 03/04/2019   Procedure: Left Hip Hardware Removal;  Surgeon: Kendal Franky SQUIBB, MD;  [facility];  Service: Orthopedics;  Laterality: Left;   HEMOSTASIS CLIP PLACEMENT  11/02/2022   Procedure: HEMOSTASIS CLIP PLACEMENT;  Surgeon: Wilhelmenia Aloha Raddle., MD;  Location: [facility];  Service: Gastroenterology;;   HIP PINNING,CANNULATED Left 07/12/2018   Procedure: CANNULATED HIP PINNING;  Surgeon: Kendal Franky SQUIBB, MD;  [facility];  Service: Orthopedics;  Laterality: Left;   HIP SURGERY     HOLMIUM LASER APPLICATION Right 07/15/2019   Procedure: HOLMIUM LASER APPLICATION;  Surgeon: Sherrilee Belvie CROME, MD;  Location: [facility];  Service: Urology;  Laterality: Right;   HOT HEMOSTASIS N/A 11/02/2022   Procedure: HOT HEMOSTASIS (ARGON PLASMA COAGULATION/BICAP);  Surgeon: Wilhelmenia Aloha Raddle., MD;  Location: [facility];  Service: Gastroenterology;  Laterality: N/A;   I & D EXTREMITY Left 07/25/2018   Procedure: Debridement of buttock, scrotum and left leg, placement of acell and vac;  Surgeon: Lowery Estefana RAMAN, DO;  [facility];  Service: Plastics;  Laterality: Left;   I & D EXTREMITY N/A 08/06/2018   Procedure: Debridement of buttock, scrotum and left leg;  Surgeon: Lowery Estefana RAMAN, DO;  [facility];  Service: Plastics;  Laterality: N/A;   I & D EXTREMITY N/A 08/13/2018   Procedure: Debridement of buttock, scrotum and left leg, placement of acell and vac;  Surgeon: Lowery Estefana RAMAN, DO;  [facility];  Service: Plastics;  Laterality: N/A;  90 min, please   INCISION AND DRAINAGE HIP Left 09/18/2019   Procedure: IRRIGATION AND DEBRIDEMENT HIP/ PELVIS WITH WOUND VAC PLACEMENT;  Surgeon: Kendal Franky SQUIBB, MD;  [facility];  Service: Orthopedics;  Laterality: Left;   INCISION AND DRAINAGE OF WOUND N/A 07/18/2018   Procedure: Debridement of left leg, buttocks and scrotal wound with placement of acell and Flexiseal;  Surgeon: Lowery Estefana RAMAN, DO;  [facility];  Service: Plastics;  Laterality: N/A;   INCISION AND DRAINAGE OF WOUND Left 08/29/2018   Procedure: Debridement of buttock, scrotum and left leg, placement of acell and vac;  Surgeon: Lowery Estefana RAMAN, DO;  [facility];  Service: Plastics;  Laterality: Left;  75 min, please   INCISION AND DRAINAGE OF WOUND Bilateral 10/23/2018   Procedure: DEBRIDEMENT OF BUTTOCK,SCROTUM, AND LEG WOUNDS WITH PLACEMENT OF ACELL- BILATERAL 90 MIN;  Surgeon: Lowery Estefana RAMAN, DO;  [facility];  Service: Plastics;  Laterality: Bilateral;   IR ANGIOGRAM PELVIS SELECTIVE OR SUPRASELECTIVE  07/10/2018   IR ANGIOGRAM PELVIS SELECTIVE OR SUPRASELECTIVE  07/10/2018   IR ANGIOGRAM SELECTIVE EACH ADDITIONAL VESSEL  07/10/2018   IR EMBO ART  VEN HEMORR LYMPH EXTRAV  INC GUIDE ROADMAPPING  07/10/2018   IR GASTROSTOMY TUBE MOD SED  01/21/2021   IR NEPHROSTOMY PLACEMENT LEFT  04/05/2019   IR NEPHROSTOMY PLACEMENT RIGHT  05/31/2019   IR US  GUIDE BX ASP/DRAIN  07/10/2018   IR US  GUIDE VASC ACCESS RIGHT  07/10/2018   IR VENO/EXT/UNI LEFT  07/10/2018   IRRIGATION AND DEBRIDEMENT OF WOUND WITH SPLIT THICKNESS SKIN GRAFT Left 09/19/2018   Procedure: Debridement of gluteal wound with placement of acell to left leg.;  Surgeon:  Lowery Estefana RAMAN, DO;  [facility];  Service: Plastics;  Laterality: Left;  2.5 hours, please   LAPAROTOMY N/A 07/12/2018   Procedure: EXPLORATORY LAPAROTOMY;  Surgeon: Sebastian Moles, MD;  Location: [facility] OR;  Service: General;  Laterality: N/A;   LAPAROTOMY N/A 07/15/2018   Procedure: WOUND EXPLORATION; CLOSURE OF ABDOMEN;  Surgeon: Sebastian Moles, MD;  Location: [facility] OR;  Service: General;  Laterality: N/A;   LAPAROTOMY  07/10/2018   Procedure: Exploratory Laparotomy;  Surgeon: Signe Mitzie LABOR, MD;  Location: [facility] OR;  Service: General;;   MASS EXCISION Left 09/18/2019   Procedure: EXCISION UPPER LEFT INNER THIGH WOUND;  Surgeon: Lowery Estefana RAMAN, DO;  [facility];  Service: Plastics;  Laterality: Left;   NEPHROLITHOTOMY Right 07/15/2019   Procedure: NEPHROLITHOTOMY PERCUTANEOUS;  Surgeon: Sherrilee Belvie CROME, MD;  Location: [facility];  Service: Urology;  Laterality: Right;  90 MINS   PEG PLACEMENT N/A 08/14/2018   Procedure: PERCUTANEOUS ENDOSCOPIC GASTROSTOMY (PEG) PLACEMENT;  Surgeon: Sebastian Moles, MD;  Location: [facility] ENDOSCOPY;  Service: General;  Laterality: N/A;   PERCUTANEOUS TRACHEOSTOMY N/A 08/02/2018   Procedure: PERCUTANEOUS TRACHEOSTOMY;  Surgeon: Sebastian Moles, MD;  Location: [facility] OR;  Service: General;  Laterality: N/A;   RADIOLOGY WITH ANESTHESIA N/A 07/10/2018   Procedure: IR WITH ANESTHESIA;  Surgeon: Adele Rush, MD;  Location: [facility] OR;  Service: Radiology;  Laterality: N/A;   RADIOLOGY WITH ANESTHESIA Right 07/10/2018   Procedure: Ir With Anesthesia;  Surgeon: Adele Rush, MD;  Location: [facility] OR;  Service: Radiology;  Laterality: Right;   SAVORY DILATION N/A 11/02/2022   Procedure: SAVORY DILATION;  Surgeon: Wilhelmenia Aloha Raddle., MD;  Location: [facility];  Service: Gastroenterology;  Laterality: N/A;   SCROTAL EXPLORATION N/A 07/15/2018   Procedure: SCROTUM DEBRIDEMENT;  Surgeon: Matilda Senior, MD;  Location: [facility] OR;  Service: Urology;  Laterality:  N/A;   SHOULDER SURGERY     SKIN SPLIT GRAFT Right 09/19/2018   Procedure: Skin Graft Split Thickness;  Surgeon: Lowery Estefana RAMAN, DO;  [facility];  Service: Plastics;  Laterality: Right;   SKIN SPLIT GRAFT N/A 10/03/2018   Procedure: Split thickness skin graft to gluteal area with acell placement;  Surgeon: Lowery Estefana RAMAN, DO;  [facility];  Service: Plastics;  Laterality: N/A;  3 hours, please   VACUUM ASSISTED CLOSURE CHANGE N/A 07/12/2018   Procedure: ABDOMINAL VACUUM ASSISTED CLOSURE CHANGE and abdominal washout;  Surgeon: Sebastian Moles, MD;  Location: [facility] OR;  Service: General;  Laterality: N/A;   WOUND DEBRIDEMENT Left 07/23/2018   Procedure: DEBRIDEMENT LEFT BUTTOCK  WOUND;  Surgeon: Sebastian Moles, MD;  Location: [facility] OR;  Service: General;  Laterality: Left;   WOUND EXPLORATION Left 07/10/2018   Procedure: WOUND EXPLORATION LEFT GROIN;  Surgeon: Signe Mitzie LABOR, MD;  Location[facility];  Service: General;  Laterality: Left;   Past Medical History:  Diagnosis Date   Acute on chronic respiratory failure with hypoxia (HCC) 06/2018   trach removed 11-16-2018, on vent from jan until may 2020 - uses albuterol  prn   Anxiety    Bacteremia due to Pseudomonas 06/2018   Chronic osteomyelitis (HCC)    Chronic pain syndrome    Clostridium difficile colitis 10/30/2019   tx with abx    Depression    DVT (deep venous thrombosis) (HCC) 2020   right brachial post PICC line   History of blood transfusion 06/2018   History of Clostridioides difficile colitis    History of kidney stones    Hypertension    norvasc  d/c by pcp on 11/05/19   Multiple traumatic injuries    Penile pain 11/18/2019   Pneumonia 11/2009   2020 x 2   Walker as ambulation aid    Wheelchair bound    electric   Wound discharge    left hip wound with bloody/clear drainage change dressing q day surgilube with gauze, between legs wound using calcium  algenate pad bid   BP 121/83   Pulse 92   SpO2 95%    Opioid Risk Score:   Fall Risk Score:  `1  Depression screen [facility] 2/9     03/06/2024    9:23 AM 01/17/2024   12:46 PM 11/20/2023   10:55 AM 11/02/2023   10:44 AM 06/12/2023   11:29 AM 06/01/2023    1:21 PM 05/04/2023   10:16 AM  Depression screen [facility] 2/9  Decreased Interest 1 1 0 0 1 2 2   Down, Depressed, Hopeless 1 1 0 0 1 2 2   PHQ - 2 Score 2 2 0 0 2 4  4  Altered sleeping   0 0  2 3  Tired, decreased energy   0 0  2 2  Change in appetite   0 0  2 3  Feeling bad or failure about yourself    0 0  2 2  Trouble concentrating   0 0  2 2  Moving slowly or fidgety/restless   0 0  1 2  Suicidal thoughts   0 0  1 0  PHQ-9 Score   0 0  16 18  Difficult doing work/chores   Not difficult at all Not difficult at all  Somewhat difficult Very difficult     Review of Systems  Musculoskeletal:  Positive for back pain.  All other systems reviewed and are negative.      Objective:   Physical Exam  Awake, alert, more alert today than usual- not falling asleep today; on O2, more interactive;  In power w/c Accompanied by wife Has lost ~ 10-15 lbs in last few months- no belly anymore.          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 S/P screw removal and on IV ABX for L hip osteomyelitis. 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   MIGHT have an incomplete paraplegia- with LLE more affected, however  cannot tell on clinical exam if weakness due to SCI vs severe debility.  Has R severe ulnar neuropathy as well. O2 3L full time.   Here for f/u on multitrauma and chronic L hip osteomyelitis with associated chronic pain.    Needs to call [facility]- and see what the next step is, if Botox was no successful-  I think it needs to be tried again.  Not sure about the amount of Botox- might need to bladder neck- kept saying cannot get it where he wanted it.   Con't gabapentin - last refill 11/20/23   3.   Need to do 3 of his cups/day of water - at minimum-  to reduce risk of dehydration and urinary tract infections. So 60 oz/day.   4. Con't MSIR-  15 mg q3 hours prn- #200-  doesn't need  refills  5. Duragesic - con't- doesn't need refills- 75 mcg q2 days  6. Con't Seroquel  - has refills- written for 9 months  7. Con't Paxil , con't as well as phenergan  12.5 to 25 mg- has both for refills.   8. Still taking Imipramine , Gemtesa and Oxybutynin  for bladder and still having Sx's   9.   Will change Imipramine  to 25-50 mg 3x/day as needed- actually cannot because Qtc is 495 in last EKG as of 03/07/24.   10. Pt's Qtc interval is 465- in 1/25- but now is 495, so have to be careful.  Can be affected by Seroquel , Imipramine  and Phenergan .so know that a prolonged Qtc can increase risk of sudden death from cardiac cause. Really start to get  concerned about 500- and is 495.   11.  D/w workers radiographer, therapeutic.  About QTC- and cannot make certain med changes.   12.   Need him to get in with Psychiatry- needs appointment still.   13.  F/U in 6 weeks- double appt- neurogenic bladder and chronic osteomyelitis and chronic pain due to trauma  I spent a total of  41  minutes on total care today- >50% coordination of care- due to d/w pt and worker's comp-  and neurogenic bladder with bladder spasms. Also went over meds with Worker's comp as well

## 2024-04-17 NOTE — Patient Instructions (Signed)
 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 S/P screw removal and on IV ABX for L hip osteomyelitis. 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  Has R severe ulnar neuropathy as well. O2 3L full time.   Here for f/u on multitrauma and chronic L hip osteomyelitis with associated chronic pain.    Needs to call [facility]- and see what the next step is, if Botox was no successful-  I think it needs to be tried again.  Not sure about the amount of Botox- might need to bladder neck- kept saying cannot get it where he wanted it.   Con't gabapentin - last refill 11/20/23   3.   Need to do 3 of his cups/day of water - at minimum-  to reduce risk of dehydration and urinary tract infections. So 60 oz/day.   4. Con't MSIR-  15 mg q3 hours prn- #200-  doesn't need  refills  5. Duragesic - con't- doesn't need refills- 75 mcg q2 days  6. Con't Seroquel  - has refills- written for 9 months  7. Con't Paxil , con't as well as phenergan  12.5 to 25 mg- has both for refills.   8. Still taking Imipramine , Gemtesa and Oxybutynin  for bladder and still having Sx's   9.   Will change Imipramine  to 25-50 mg 3x/day as needed- actually cannot because Qtc is 495 in last EKG as of 03/07/24.   10. Pt's Qtc interval is  465- in 1/25- but now is 495, so have to be careful.  Can be affected by Seroquel , Imipramine  and Phenergan .so know that a prolonged Qtc can increase risk of sudden death from cardiac cause. Really start to get concerned about 500- and is 495.   11.  D/w workers radiographer, therapeutic.  About QTC- and cannot make certain med changes.   12.   Need him to get in with Psychiatry- needs appointment still.   13.  F/U in 6 weeks- double appt- neurogenic bladder and chronic osteomyelitis and chronic pain due to trauma

## 2024-04-26 ENCOUNTER — Telehealth: Payer: Self-pay

## 2024-04-26 MED ORDER — MORPHINE SULFATE 15 MG PO TABS: 15.0000 mg | ORAL_TABLET | ORAL | 0 refills | Status: DC | PRN

## 2024-04-30 ENCOUNTER — Encounter: Payer: PRIVATE HEALTH INSURANCE | Admitting: Psychology

## 2024-05-06 ENCOUNTER — Ambulatory Visit: Payer: PRIVATE HEALTH INSURANCE | Admitting: Family Medicine

## 2024-05-06 ENCOUNTER — Encounter: Payer: Self-pay | Admitting: Family Medicine

## 2024-05-06 VITALS — BP 130/88 | HR 92 | Ht 75.0 in

## 2024-05-06 DIAGNOSIS — F1120 Opioid dependence, uncomplicated: Secondary | ICD-10-CM

## 2024-05-06 DIAGNOSIS — I10 Essential (primary) hypertension: Secondary | ICD-10-CM

## 2024-05-06 DIAGNOSIS — Z23 Encounter for immunization: Secondary | ICD-10-CM

## 2024-05-06 DIAGNOSIS — E876 Hypokalemia: Secondary | ICD-10-CM

## 2024-05-06 DIAGNOSIS — F339 Major depressive disorder, recurrent, unspecified: Secondary | ICD-10-CM

## 2024-05-06 DIAGNOSIS — G8220 Paraplegia, unspecified: Secondary | ICD-10-CM

## 2024-05-06 DIAGNOSIS — Z9359 Other cystostomy status: Secondary | ICD-10-CM

## 2024-05-06 LAB — COMPREHENSIVE METABOLIC PANEL WITH GFR
ALT: 24 U/L (ref 0–53)
AST: 31 U/L (ref 0–37)
Albumin: 4.2 g/dL (ref 3.5–5.2)
Alkaline Phosphatase: 105 U/L (ref 39–117)
BUN: 9 mg/dL (ref 6–23)
CO2: 33 meq/L — ABNORMAL HIGH (ref 19–32)
Calcium: 10.5 mg/dL (ref 8.4–10.5)
Chloride: 100 meq/L (ref 96–112)
Creatinine, Ser: 0.67 mg/dL (ref 0.40–1.50)
GFR: 103.57 mL/min (ref >60.00)
Glucose, Bld: 84 mg/dL (ref 70–99)
Potassium: 3.7 meq/L (ref 3.5–5.1)
Sodium: 139 meq/L (ref 135–145)
Total Bilirubin: 0.3 mg/dL (ref 0.2–1.2)
Total Protein: 7.4 g/dL (ref 6.0–8.3)

## 2024-05-06 LAB — CBC
HCT: 39.3 % (ref 39.0–52.0)
Hemoglobin: 13 g/dL (ref 13.0–17.0)
MCHC: 33 g/dL (ref 30.0–36.0)
MCV: 91.3 fl (ref 78.0–100.0)
Platelets: 241 K/uL (ref 150.0–400.0)
RBC: 4.3 Mil/uL (ref 4.22–5.81)
RDW: 15.1 % (ref 11.5–15.5)
WBC: 5.1 K/uL (ref 4.0–10.5)

## 2024-05-06 MED ORDER — HYDROCHLOROTHIAZIDE 12.5 MG PO TABS: 12.5000 mg | ORAL_TABLET | Freq: Every day | ORAL | 3 refills | Status: AC

## 2024-05-06 MED ORDER — POTASSIUM CHLORIDE CRYS ER 20 MEQ PO TBCR: 20.0000 meq | EXTENDED_RELEASE_TABLET | Freq: Every day | ORAL | 1 refills | Status: AC

## 2024-05-06 NOTE — Patient Instructions (Signed)
 It was very nice to see you today!  VISIT SUMMARY: You came in for a follow-up on your urological issues and pain management. We discussed your chronic bladder pain, recent procedure, and current medications. We also reviewed your recent hospitalization due to low potassium levels and your blood pressure management.  YOUR PLAN: NEUROGENIC BLADDER WITH CHRONIC PAIN: You have ongoing bladder pain despite previous treatments. The recent procedure was not successful, and you are waiting for approval for another attempt in the operating room. -Continue with your current management until the OR procedure is approved.  HYPOKALEMIA: You had low potassium levels during your recent hospitalization, likely due to your diuretics. -Continue taking your daily potassium supplements. -We checked your potassium levels today. -Monitor for any symptoms of low potassium, such as muscle weakness or cramps.  ESSENTIAL HYPERTENSION: Your blood pressure is well-controlled with your current medications. -Continue with your current blood pressure medications.  Return in about 6 months (around 11/03/2024) for Follow Up.   Take care, Dr Kennyth  PLEASE NOTE:  If you had any lab tests, please let us  know if you have not heard back within a few days. You may see your results on mychart before we have a chance to review them but we will give you a call once they are reviewed by us .   If we ordered any referrals today, please let us  know if you have not heard from their office within the next week.   If you had any urgent prescriptions sent in today, please check with the pharmacy within an hour of our visit to make sure the prescription was transmitted appropriately.   Please try these tips to maintain a healthy lifestyle:  Eat at least 3 REAL meals and 1-2 snacks per day.  Aim for no more than 5 hours between eating.  If you eat breakfast, please do so within one hour of getting up.   Each meal should contain half  fruits/vegetables, one quarter protein, and one quarter carbs (no bigger than a computer mouse)  Cut down on sweet beverages. This includes juice, soda, and sweet tea.   Drink at least 1 glass of water  with each meal and aim for at least 8 glasses per day  Exercise at least 150 minutes every week.

## 2024-05-06 NOTE — Assessment & Plan Note (Signed)
 Follows with urology.  Will be having interventional procedure soon for bladder spasms as the in office Botox injections were not effective.

## 2024-05-06 NOTE — Assessment & Plan Note (Signed)
 Stable.  Continue management per PMR

## 2024-05-06 NOTE — Assessment & Plan Note (Signed)
 Overall symptoms are stable although has olanzapine  was recently switched back to Seroquel .  He is also on Paxil  40 mg daily and will be seeing a neuropsychiatrist in a few weeks.

## 2024-05-06 NOTE — Assessment & Plan Note (Signed)
 Blood pressure at goal today on HCTZ 12.5 mg daily.  As above he was recently admitted for hypokalemia likely due to poor p.o. intake however may be related to his HCTZ use as well.  They are now using furosemide  only as needed.  He is continue with potassium supplementation daily.  We did discuss importance of continuing with daily supplementation.  Will recheck labs today.

## 2024-05-06 NOTE — Progress Notes (Signed)
   Christopher [name] is a 58 y.o. male who presents today for an office visit.  Assessment/Plan:  New/Acute Problems: Hypokalemia Patient recently in the hospital from 9 25 through 9/28 for hypokalemia.  This was likely due to poor p.o. intake however they were concerned that it was due to HCTZ.  His wife has since restarted this and his blood pressure is at goal.  We did discuss importance of continue with potassium supplementation and will recheck labs today.  Chronic Problems Addressed Today: Essential hypertension Blood pressure at goal today on HCTZ 12.5 mg daily.  As above he was recently admitted for hypokalemia likely due to poor p.o. intake however may be related to his HCTZ use as well.  They are now using furosemide  only as needed.  He is continue with potassium supplementation daily.  We did discuss importance of continuing with daily supplementation.  Will recheck labs today.  Paraplegia, unspecified (HCC) Stable.  Continue management per PMR  Opioid dependence, uncomplicated (HCC) Stable.  Continue pain management per PMR.  Depression, recurrent Overall symptoms are stable although has olanzapine  was recently switched back to Seroquel .  He is also on Paxil  40 mg daily and will be seeing a neuropsychiatrist in a few weeks.  Suprapubic catheter [facility]) Follows with urology.  Will be having interventional procedure soon for bladder spasms as the in office Botox injections were not effective.     Subjective:  HPI:  See assessment / plan for status of chronic conditions.   Discussed the use of AI scribe software for clinical note transcription with the patient, who gave verbal consent to proceed.  History of Present Illness Christopher [name] is a 58 year old male with chronic bladder pain who presents for follow-up of his urological issues and pain management.  He has been experiencing significant and persistent bladder pain. On March 31, 2024, he underwent a procedure  with a urology specialist where Botox was attempted for bladder issues, but it was unsuccessful as the procedure could not reach the necessary area. He is awaiting approval for another attempt in the operating room. Post-procedure, he was on Keflex  but is not currently taking it. He continues to experience pain in the bladder area.  He reports worsening hip pain due to chronic osteomyelitis. He has an upcoming appointment with an infectious disease specialist scheduled for next Monday.  He was hospitalized recently due to severe pain and low potassium levels. During this time, he was not eating well, which was a concern. His appetite has since improved after switching from olanzapine  to Seroquel , which he believes affected his weight and appetite.  He is currently taking HCTZ and Lasix  for fluid management, with potassium supplements to counteract potential low potassium levels. He takes Lasix  periodically, especially when noticing swelling in his legs, and ensures he takes potassium supplements daily.         Objective:  Physical Exam: BP 130/88   Pulse 92   Ht 6' 3 (1.905 m)   SpO2 98%   BMI 24.50 kg/m   Gen: No acute distress, resting comfortably CV: Regular rate and rhythm with no murmurs appreciated Pulm: Normal work of breathing, clear to auscultation bilaterally with no crackles, wheezes, or rhonchi Neuro: in wheelchair.  Moves upper extremities spontaneously. Psych: Normal affect and thought content      Larraine Argo M. Kennyth, MD 05/06/2024 11:09 AM

## 2024-05-06 NOTE — Assessment & Plan Note (Signed)
 Stable.  Continue pain management per PMR.

## 2024-05-07 ENCOUNTER — Ambulatory Visit: Payer: Self-pay | Admitting: Family Medicine

## 2024-05-07 NOTE — Progress Notes (Signed)
 Great news! Labs are all normal. He should continue with his current treatment plan and we can recheck in 6 to 12 months.

## 2024-05-08 NOTE — Telephone Encounter (Signed)
 Lovorn, Megan, MD  [facility] Clinical Pool12 days ago    Filled it to fill 11/9- but sent in again if they don't have any for him- thanks ML   You  Lovorn, Megan, MD12 days ago    Patient requesting refill for Morphine .  I did not see a refill for November.  Please advise if refill is authorized and I will notify patient.

## 2024-05-13 ENCOUNTER — Ambulatory Visit: Payer: PRIVATE HEALTH INSURANCE | Admitting: Family

## 2024-05-29 ENCOUNTER — Encounter: Payer: PRIVATE HEALTH INSURANCE | Admitting: Physical Medicine and Rehabilitation

## 2024-06-01 ENCOUNTER — Other Ambulatory Visit: Payer: Self-pay | Admitting: Physical Medicine and Rehabilitation

## 2024-06-07 ENCOUNTER — Telehealth: Payer: Self-pay | Admitting: Family Medicine

## 2024-06-07 NOTE — Telephone Encounter (Signed)
 [facility] faxed Home Health Certificate (Order ID [id number]), to be filled out by provider. [facility] requested to send it back via Fax within ASAP. Document is located in providers tray at front office.Please advise at [phone].

## 2024-06-10 DIAGNOSIS — F32A Depression, unspecified: Secondary | ICD-10-CM | POA: Diagnosis not present

## 2024-06-10 DIAGNOSIS — J9611 Chronic respiratory failure with hypoxia: Secondary | ICD-10-CM | POA: Diagnosis not present

## 2024-06-10 DIAGNOSIS — E43 Unspecified severe protein-calorie malnutrition: Secondary | ICD-10-CM | POA: Diagnosis not present

## 2024-06-10 DIAGNOSIS — I10 Essential (primary) hypertension: Secondary | ICD-10-CM | POA: Diagnosis not present

## 2024-06-10 DIAGNOSIS — M86652 Other chronic osteomyelitis, left thigh: Secondary | ICD-10-CM | POA: Diagnosis not present

## 2024-06-10 DIAGNOSIS — G894 Chronic pain syndrome: Secondary | ICD-10-CM | POA: Diagnosis not present

## 2024-06-10 DIAGNOSIS — Z433 Encounter for attention to colostomy: Secondary | ICD-10-CM | POA: Diagnosis not present

## 2024-06-10 DIAGNOSIS — Z466 Encounter for fitting and adjustment of urinary device: Secondary | ICD-10-CM | POA: Diagnosis not present

## 2024-06-10 DIAGNOSIS — I700 Atherosclerosis of aorta: Secondary | ICD-10-CM | POA: Diagnosis not present

## 2024-06-10 DIAGNOSIS — Z4800 Encounter for change or removal of nonsurgical wound dressing: Secondary | ICD-10-CM | POA: Diagnosis not present

## 2024-06-10 DIAGNOSIS — G8220 Paraplegia, unspecified: Secondary | ICD-10-CM | POA: Diagnosis not present

## 2024-06-10 DIAGNOSIS — Z435 Encounter for attention to cystostomy: Secondary | ICD-10-CM | POA: Diagnosis not present

## 2024-06-10 NOTE — Telephone Encounter (Signed)
Form faxed to [phone] Form placed to be scan in patient chart

## 2024-06-15 ENCOUNTER — Other Ambulatory Visit: Payer: Self-pay | Admitting: Internal Medicine

## 2024-06-15 ENCOUNTER — Other Ambulatory Visit: Payer: Self-pay | Admitting: Family Medicine

## 2024-06-15 DIAGNOSIS — J439 Emphysema, unspecified: Secondary | ICD-10-CM

## 2024-06-17 ENCOUNTER — Other Ambulatory Visit: Payer: Self-pay

## 2024-06-17 MED ORDER — MORPHINE SULFATE 15 MG PO TABS: 15.0000 mg | ORAL_TABLET | ORAL | 0 refills | Status: DC | PRN

## 2024-06-17 MED ORDER — FENTANYL 75 MCG/HR TD PT72: 1.0000 | MEDICATED_PATCH | TRANSDERMAL | 0 refills | Status: DC

## 2024-06-17 NOTE — Telephone Encounter (Signed)
 Christopher [name] called: Please send refill of Morphine  & Fentanyl  to the pharmacy. Thank you

## 2024-06-26 ENCOUNTER — Other Ambulatory Visit: Payer: Self-pay | Admitting: Urology

## 2024-07-03 ENCOUNTER — Telehealth: Payer: Self-pay | Admitting: Family Medicine

## 2024-07-03 NOTE — Telephone Encounter (Signed)
 Copied from CRM #[id number]. Topic: Clinical - Medical Advice >> Jul 03, 2024  3:24 PM Alexandria E wrote: Reason for CRM: Montie, patient's medical case manager, is needing an order for a new bedside table for the patient that has raised edges and a cup holder. Also needing an order to evaluate/replace the bed that he has, as a spring might be coming through. Callback number for Montie is [phone] and fax number [phone].

## 2024-07-04 NOTE — Telephone Encounter (Signed)
 Ok to send order?

## 2024-07-05 NOTE — Telephone Encounter (Signed)
 Ok with me. Please place any necessary orders.

## 2024-07-08 ENCOUNTER — Other Ambulatory Visit: Payer: Self-pay | Admitting: *Deleted

## 2024-07-08 DIAGNOSIS — G8220 Paraplegia, unspecified: Secondary | ICD-10-CM

## 2024-07-08 DIAGNOSIS — Z993 Dependence on wheelchair: Secondary | ICD-10-CM

## 2024-07-08 NOTE — Telephone Encounter (Signed)
 Order placed

## 2024-07-09 NOTE — Telephone Encounter (Signed)
 DME order faxed to [phone].

## 2024-07-10 ENCOUNTER — Encounter
Payer: PRIVATE HEALTH INSURANCE | Attending: Physical Medicine and Rehabilitation | Admitting: Physical Medicine and Rehabilitation

## 2024-07-10 ENCOUNTER — Encounter: Payer: Self-pay | Admitting: Physical Medicine and Rehabilitation

## 2024-07-10 VITALS — BP 131/89 | HR 101 | Ht 75.0 in

## 2024-07-10 DIAGNOSIS — R413 Other amnesia: Secondary | ICD-10-CM | POA: Insufficient documentation

## 2024-07-10 DIAGNOSIS — S3730XS Unspecified injury of urethra, sequela: Secondary | ICD-10-CM | POA: Insufficient documentation

## 2024-07-10 DIAGNOSIS — R9431 Abnormal electrocardiogram [ECG] [EKG]: Secondary | ICD-10-CM | POA: Insufficient documentation

## 2024-07-10 DIAGNOSIS — N3289 Other specified disorders of bladder: Secondary | ICD-10-CM | POA: Diagnosis not present

## 2024-07-10 DIAGNOSIS — T07XXXA Unspecified multiple injuries, initial encounter: Secondary | ICD-10-CM | POA: Diagnosis not present

## 2024-07-10 DIAGNOSIS — G8921 Chronic pain due to trauma: Secondary | ICD-10-CM | POA: Insufficient documentation

## 2024-07-10 DIAGNOSIS — Z993 Dependence on wheelchair: Secondary | ICD-10-CM | POA: Insufficient documentation

## 2024-07-10 DIAGNOSIS — G709 Myoneural disorder, unspecified: Secondary | ICD-10-CM | POA: Diagnosis not present

## 2024-07-10 MED ORDER — FENTANYL 100 MCG/HR TD PT72: 1.0000 | MEDICATED_PATCH | TRANSDERMAL | 0 refills | Status: AC

## 2024-07-10 MED ORDER — MORPHINE SULFATE 15 MG PO TABS: 15.0000 mg | ORAL_TABLET | ORAL | 0 refills | Status: AC | PRN

## 2024-07-10 NOTE — Progress Notes (Signed)
 "  Subjective:    Patient ID: Christopher [name], male    DOB: [birth year], 59 y.o.   MRN: [id number]  HPI  Pt is a 59 yr old male with hx of Multitrauma- causing L femoral neck fx, degloving of L hip to going/scrotum, bladder neck trauma- got SPC, developed compartment syndrome -s/p surgery for that; also diverting colostomy, skin grafts, and s/p trach and PEG- PEG is out. 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 S/P screw removal and on IV ABX for L hip osteomyelitis. 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  Has R severe ulnar neuropathy as well. O2 3L full time.   Here for f/u on multitrauma and chronic L hip osteomyelitis with associated chronic pain.    Having a cystoscopy in 2 weeks due to bladder spasms and bladder pain.    Laying in bed because hurting so bad. Doesn't know what else to do.    Is interested in increasing/changing pain meds.   Still not into Psychiatry- was fighting and screaming to not come to appt today.   Stays in bed, because not sleeping well.  Doesn't want to get OOB- moving a lot in bed- so blankets and pillows on floor.  Been like this for > 1 month or more.    Giving MSIR every 3 hours-  Around the clock right now, because pain worse.    Has airbed with springs- and springs are popping through on edge and  Only 59 years old, but needs to replace q1-2 years.  Where he sits on side.   Also needs a new hospital bed table- his is broken.  Sits on EOB and rocks with elbow on table, to help pain control- but  breaking down both.    Pain Inventory Average Pain 10 Pain Right Now 10 My pain is constant, sharp, burning, dull, stabbing, tingling, and aching  In the last 24 hours, has pain interfered with the following? General activity 10 Relation with others 10 Enjoyment of life 10 What TIME of day is your pain at its worst? morning , daytime, evening, and night Sleep (in general) Poor  Pain is worse with: walking, bending, sitting, inactivity, standing, unsure, and some activites Pain improves with: rest, heat/ice, therapy/exercise, pacing activities, medication, and injections Relief from Meds: 0  Family History  Problem Relation Age of Onset   Breast cancer Mother        with mets to the bones   Social History   Socioeconomic History   Marital status: Married    Spouse name: Not on file   Number of children: Not on file   Years of education: Not on file   Highest education level: Not on file  Occupational History   Occupation: Disable  Tobacco Use   Smoking status: Former    Current packs/day: 0.00    Average packs/day: 1 pack/day for 20.0 years (20.0 ttl pk-yrs)    Types: Cigarettes    Start date: 07/10/1998    Quit date: 07/10/2018    Years since  quitting: 6.0   Smokeless tobacco: Never  Vaping Use   Vaping status: Never Used  Substance and Sexual Activity   Alcohol use: Never   Drug use: Yes    Types: Oxycodone , Fentanyl     Comment: Fentanyl  patch/oxycodone  since 06/2018   Sexual activity: Yes  Other Topics Concern   Not on file  Social History Narrative   ** Merged History Encounter **       Social Drivers of Health   Tobacco Use: Medium Risk (07/10/2024)   Patient History    Smoking Tobacco Use: Former    Smokeless Tobacco Use: Never    Passive Exposure: Not on Actuary Strain: Not on file  Food Insecurity: No Food Insecurity (03/08/2024)   Epic    Worried About Programme Researcher, Broadcasting/film/video in the Last Year: Never true    Ran Out of Food in the  Last Year: Never true  Transportation Needs: No Transportation Needs (03/08/2024)   Epic    Lack of Transportation (Medical): No    Lack of Transportation (Non-Medical): No  Physical Activity: Not on file  Stress: Not on file  Social Connections: Not on file  Depression (PHQ2-9): Medium Risk (07/10/2024)   Depression (PHQ2-9)    PHQ-2 Score: 6  Alcohol Screen: Not on file  Housing: Low Risk (03/08/2024)   Epic    Unable to Pay for Housing in the Last Year: No    Number of Times Moved in the Last Year: 0    Homeless in the Last Year: No  Utilities: Not At Risk (03/08/2024)   Epic    Threatened with loss of utilities: No  Health Literacy: Not on file   Past Surgical History:  Procedure Laterality Date   APPLICATION OF A-CELL OF BACK N/A 08/06/2018   Procedure: Application Of A-Cell Of Back;  Surgeon: Lowery Estefana RAMAN, DO;  [facility];  Service: Plastics;  Laterality: N/A;   APPLICATION OF A-CELL OF EXTREMITY Left 08/06/2018   Procedure: Application Of A-Cell Of Extremity;  Surgeon: Lowery Estefana RAMAN, DO;  [facility];  Service: Plastics;  Laterality: Left;   APPLICATION OF A-CELL OF EXTREMITY Left 09/18/2019   Procedure: APPLICATION OF A-CELL OF EXTREMITY;  Surgeon: Lowery Estefana RAMAN, DO;  [facility];  Service: Plastics;  Laterality: Left;   APPLICATION OF WOUND VAC  07/12/2018   Procedure: Application Of Wound Vac to the Left Thigh and Scrotum.;  Surgeon: Kendal Franky SQUIBB, MD;  [facility];  Service: Orthopedics;;   APPLICATION OF WOUND VAC  07/10/2018   Procedure: Application Of Wound Vac;  Surgeon: Signe Mitzie LABOR, MD;  Location: [facility] OR;  Service: General;;   BIOPSY  11/02/2022   Procedure: BIOPSY;  Surgeon: Wilhelmenia Aloha Raddle., MD;  Location: [facility];  Service: Gastroenterology;;   COLON SURGERY  2020   colostomy   COLOSTOMY N/A 07/23/2018   Procedure: COLOSTOMY;  Surgeon: Sebastian Moles, MD;  Location[facility];  Service: General;  Laterality: N/A;    CYSTOSCOPY W/ URETERAL STENT PLACEMENT N/A 07/15/2018   Procedure: RETROGRADE URETHROGRAM;  Surgeon: Matilda Senior, MD;  Location: [facility] OR;  Service: Urology;  Laterality: N/A;   CYSTOSCOPY WITH LITHOLAPAXY N/A 05/06/2019   Procedure: CYSTOSCOPY BASKET BLADDER STONE EXTRACTION;  Surgeon: Sherrilee Belvie CROME, MD;  Location: [facility];  Service: Urology;  Laterality: N/A;  30 MINS   CYSTOSTOMY N/A 05/06/2019   Procedure: REPLACEMENT OF SUPRAPUBIC CATHETER;  Surgeon: Sherrilee Belvie CROME, MD;  [facility];  Service: Urology;  Laterality: N/A;   DEBRIDEMENT AND CLOSURE WOUND Left 03/04/2019   Procedure: Excision of hip wound with placement of Acell;  Surgeon: Lowery Estefana RAMAN, DO;  [facility];  Service: Plastics;  Laterality: Left;   ESOPHAGOGASTRODUODENOSCOPY N/A 08/14/2018   Procedure: ESOPHAGOGASTRODUODENOSCOPY (EGD);  Surgeon: Sebastian Moles, MD;  Location: [facility];  Service: General;  Laterality: N/A;  bedside   ESOPHAGOGASTRODUODENOSCOPY (EGD) WITH PROPOFOL  N/A 11/02/2022   Procedure: ESOPHAGOGASTRODUODENOSCOPY (EGD) WITH PROPOFOL ;  Surgeon: Wilhelmenia Aloha Raddle., MD;  Location: [facility];  Service: Gastroenterology;  Laterality: N/A;   FACIAL RECONSTRUCTION SURGERY     X 2--once as a teenager and second time in his 59's   HARDWARE REMOVAL Left 03/04/2019   Procedure: Left Hip Hardware Removal;  Surgeon: Kendal Franky SQUIBB, MD;  [facility];  Service: Orthopedics;  Laterality: Left;   HEMOSTASIS CLIP PLACEMENT  11/02/2022   Procedure: HEMOSTASIS CLIP PLACEMENT;  Surgeon: Wilhelmenia Aloha Raddle., MD;  Location: [facility];  Service: Gastroenterology;;   HIP PINNING,CANNULATED Left 07/12/2018   Procedure: CANNULATED HIP PINNING;  Surgeon: Kendal Franky SQUIBB, MD;  [facility];  Service: Orthopedics;  Laterality: Left;   HIP SURGERY     HOLMIUM LASER APPLICATION Right 07/15/2019   Procedure: HOLMIUM LASER APPLICATION;  Surgeon: Sherrilee Belvie CROME, MD;  Location: [facility];  Service: Urology;  Laterality: Right;   HOT HEMOSTASIS N/A 11/02/2022   Procedure: HOT HEMOSTASIS (ARGON PLASMA COAGULATION/BICAP);  Surgeon: Wilhelmenia Aloha Raddle., MD;  Location: [facility];  Service: Gastroenterology;  Laterality: N/A;   I & D EXTREMITY Left 07/25/2018   Procedure: Debridement of buttock, scrotum and left leg, placement of acell and vac;  Surgeon: Lowery Estefana RAMAN, DO;  [facility];  Service: Plastics;  Laterality: Left;   I & D EXTREMITY N/A 08/06/2018   Procedure: Debridement of buttock, scrotum and left leg;  Surgeon: Lowery Estefana RAMAN, DO;  [facility];  Service: Plastics;  Laterality: N/A;   I & D EXTREMITY N/A 08/13/2018   Procedure: Debridement of buttock, scrotum and left leg, placement of acell and vac;  Surgeon: Lowery Estefana RAMAN, DO;  [facility];  Service: Plastics;  Laterality: N/A;  90 min, please   INCISION AND DRAINAGE HIP Left 09/18/2019   Procedure: IRRIGATION AND DEBRIDEMENT HIP/ PELVIS WITH WOUND VAC PLACEMENT;  Surgeon: Kendal Franky SQUIBB, MD;  [facility];  Service: Orthopedics;  Laterality: Left;   INCISION AND DRAINAGE OF WOUND N/A 07/18/2018   Procedure: Debridement of left leg, buttocks and scrotal wound with placement of acell and Flexiseal;  Surgeon: Lowery Estefana RAMAN, DO;  [facility];  Service: Plastics;  Laterality: N/A;   INCISION AND DRAINAGE OF WOUND Left 08/29/2018   Procedure: Debridement of buttock, scrotum and left leg, placement of acell and vac;  Surgeon: Lowery Estefana RAMAN, DO;  [facility];  Service: Plastics;  Laterality: Left;  75 min, please   INCISION AND DRAINAGE OF WOUND Bilateral 10/23/2018   Procedure: DEBRIDEMENT OF BUTTOCK,SCROTUM, AND LEG WOUNDS WITH PLACEMENT OF ACELL- BILATERAL 90 MIN;  Surgeon: Lowery Estefana RAMAN, DO;  [facility];  Service: Plastics;  Laterality: Bilateral;   IR ANGIOGRAM PELVIS SELECTIVE OR SUPRASELECTIVE  07/10/2018    IR ANGIOGRAM PELVIS SELECTIVE OR SUPRASELECTIVE  07/10/2018   IR ANGIOGRAM SELECTIVE EACH ADDITIONAL VESSEL  07/10/2018   IR EMBO ART  VEN HEMORR LYMPH EXTRAV  INC GUIDE ROADMAPPING  07/10/2018   IR GASTROSTOMY TUBE MOD SED  01/21/2021   IR NEPHROSTOMY PLACEMENT LEFT  04/05/2019   IR NEPHROSTOMY PLACEMENT RIGHT  05/31/2019   IR US  GUIDE BX ASP/DRAIN  07/10/2018   IR US  GUIDE VASC ACCESS RIGHT  07/10/2018   IR VENO/EXT/UNI LEFT  07/10/2018   IRRIGATION AND DEBRIDEMENT OF WOUND WITH SPLIT THICKNESS SKIN GRAFT Left 09/19/2018   Procedure: Debridement of gluteal wound with placement of acell to left leg.;  Surgeon: Lowery Estefana RAMAN, DO;  [facility];  Service: Plastics;  Laterality: Left;  2.5 hours, please   LAPAROTOMY N/A 07/12/2018   Procedure: EXPLORATORY LAPAROTOMY;  Surgeon: Sebastian Moles, MD;  Location: [facility] OR;  Service: General;  Laterality: N/A;   LAPAROTOMY N/A 07/15/2018   Procedure: WOUND EXPLORATION; CLOSURE OF ABDOMEN;  Surgeon: Sebastian Moles, MD;  Location: [facility] OR;  Service: General;  Laterality: N/A;   LAPAROTOMY  07/10/2018   Procedure: Exploratory Laparotomy;  Surgeon: Signe Mitzie LABOR, MD;  Location: [facility] OR;  Service: General;;   MASS EXCISION Left 09/18/2019   Procedure: EXCISION UPPER LEFT INNER THIGH WOUND;  Surgeon: Lowery Estefana RAMAN, DO;  [facility];  Service: Plastics;  Laterality: Left;   NEPHROLITHOTOMY Right 07/15/2019   Procedure: NEPHROLITHOTOMY PERCUTANEOUS;  Surgeon: Sherrilee Belvie CROME, MD;  [facility];  Service: Urology;  Laterality: Right;  90 MINS   PEG PLACEMENT N/A 08/14/2018   Procedure: PERCUTANEOUS ENDOSCOPIC GASTROSTOMY (PEG) PLACEMENT;  Surgeon: Sebastian Moles, MD;  Location: [facility] END[facility]COPY;  Service: General;  Laterality: N/A;   PERCUTANEOUS TRACHEOSTOMY N/A 08/02/2018   Procedure: PERCUTANEOUS TRACHEOSTOMY;  Surgeon: Sebastian Moles, MD;  Location: [facility] OR;  Service: General;  Laterality: N/A;   RADIOLOGY WITH ANESTHESIA N/A  07/10/2018   Procedure: IR WITH ANESTHESIA;  Surgeon: Adele Rush, MD;  Location: [facility] OR;  Service: Radiology;  Laterality: N/A;   RADIOLOGY WITH ANESTHESIA Right 07/10/2018   Procedure: Ir With Anesthesia;  Surgeon: Adele Rush, MD;  Location: [facility] OR;  Service: Radiology;  Laterality: Right;   SAVORY DILATION N/A 11/02/2022   Procedure: SAVORY DILATION;  Surgeon: Wilhelmenia Aloha Raddle., MD;  Location: [facility];  Service: Gastroenterology;  Laterality: N/A;   SCROTAL EXPLORATION N/A 07/15/2018   Procedure: SCROTUM DEBRIDEMENT;  Surgeon: Matilda Senior, MD;  Location: [facility] OR;  Service: Urology;  Laterality: N/A;   SHOULDER SURGERY     SKIN SPLIT GRAFT Right 09/19/2018   Procedure: Skin Graft Split Thickness;  Surgeon: Lowery Estefana RAMAN, DO;  [facility];  Service: Plastics;  Laterality: Right;   SKIN SPLIT GRAFT N/A 10/03/2018   Procedure: Split thickness skin graft to gluteal area with acell placement;  Surgeon: Lowery Estefana RAMAN, DO;  [facility];  Service: Plastics;  Laterality: N/A;  3 hours, please   VACUUM ASSISTED CLOSURE CHANGE N/A 07/12/2018   Procedure: ABDOMINAL VACUUM ASSISTED CLOSURE CHANGE and abdominal washout;  Surgeon: Sebastian Moles, MD;  Location: [facility] OR;  Service: General;  Laterality: N/A;   WOUND DEBRIDEMENT Left 07/23/2018   Procedure: DEBRIDEMENT LEFT BUTTOCK  WOUND;  Surgeon: Sebastian Moles, MD;  Location: [facility] OR;  Service: General;  Laterality: Left;   WOUND EXPLORATION Left 07/10/2018   Procedure: WOUND EXPLORATION LEFT GROIN;  Surgeon: Signe Mitzie LABOR, MD;  Location: [facility] OR;  Service: General;  Laterality: Left;   Past Surgical History:  Procedure Laterality Date   APPLICATION OF A-CELL OF BACK N/A 08/06/2018   Procedure: Application Of A-Cell Of Back;  Surgeon: Lowery,  Estefana RAMAN, DO;  [facility];  Service: Plastics;  Laterality: N/A;   APPLICATION OF A-CELL OF EXTREMITY Left 08/06/2018   Procedure: Application Of A-Cell Of Extremity;  Surgeon:  Lowery Estefana RAMAN, DO;  [facility];  Service: Plastics;  Laterality: Left;   APPLICATION OF A-CELL OF EXTREMITY Left 09/18/2019   Procedure: APPLICATION OF A-CELL OF EXTREMITY;  Surgeon: Lowery Estefana RAMAN, DO;  [facility];  Service: Plastics;  Laterality: Left;   APPLICATION OF WOUND VAC  07/12/2018   Procedure: Application Of Wound Vac to the Left Thigh and Scrotum.;  Surgeon: Kendal Franky SQUIBB, MD;  [facility];  Service: Orthopedics;;   APPLICATION OF WOUND VAC  07/10/2018   Procedure: Application Of Wound Vac;  Surgeon: Signe Mitzie LABOR, MD;  Location: [facility] OR;  Service: General;;   BIOPSY  11/02/2022   Procedure: BIOPSY;  Surgeon: Wilhelmenia Aloha Raddle., MD;  Location: [facility];  Service: Gastroenterology;;   COLON SURGERY  2020   colostomy   COLOSTOMY N/A 07/23/2018   Procedure: COLOSTOMY;  Surgeon: Sebastian Moles, MD;  Location: [facility] OR;  Service: General;  Laterality: N/A;   CYSTOSCOPY W/ URETERAL STENT PLACEMENT N/A 07/15/2018   Procedure: RETROGRADE URETHROGRAM;  Surgeon: Matilda Senior, MD;  Location: [facility] OR;  Service: Urology;  Laterality: N/A;   CYSTOSCOPY WITH LITHOLAPAXY N/A 05/06/2019   Procedure: CYSTOSCOPY BASKET BLADDER STONE EXTRACTION;  Surgeon: Sherrilee Belvie CROME, MD;  Location: [facility];  Service: Urology;  Laterality: N/A;  30 MINS   CYSTOSTOMY N/A 05/06/2019   Procedure: REPLACEMENT OF SUPRAPUBIC CATHETER;  Surgeon: Sherrilee Belvie CROME, MD;  Location: [facility];  Service: Urology;  Laterality: N/A;   DEBRIDEMENT AND CLOSURE WOUND Left 03/04/2019   Procedure: Excision of hip wound with placement of Acell;  Surgeon: Lowery Estefana RAMAN, DO;  [facility];  Service: Plastics;  Laterality: Left;   ESOPHAGOGASTRODUODENOSCOPY N/A 08/14/2018   Procedure: ESOPHAGOGASTRODUODENOSCOPY (EGD);  Surgeon: Sebastian Moles, MD;  Location: [facility] END[facility]COPY;  Service: General;  Laterality: N/A;  bedside   ESOPHAGOGASTRODUODENOSCOPY (EGD)  WITH PROPOFOL  N/A 11/02/2022   Procedure: ESOPHAGOGASTRODUODENOSCOPY (EGD) WITH PROPOFOL ;  Surgeon: Wilhelmenia Aloha Raddle., MD;  Location: [facility];  Service: Gastroenterology;  Laterality: N/A;   FACIAL RECONSTRUCTION SURGERY     X 2--once as a teenager and second time in his 85's   HARDWARE REMOVAL Left 03/04/2019   Procedure: Left Hip Hardware Removal;  Surgeon: Kendal Franky SQUIBB, MD;  [facility];  Service: Orthopedics;  Laterality: Left;   HEMOSTASIS CLIP PLACEMENT  11/02/2022   Procedure: HEMOSTASIS CLIP PLACEMENT;  Surgeon: Wilhelmenia Aloha Raddle., MD;  Location: [facility];  Service: Gastroenterology;;   HIP PINNING,CANNULATED Left 07/12/2018   Procedure: CANNULATED HIP PINNING;  Surgeon: Kendal Franky SQUIBB, MD;  [facility];  Service: Orthopedics;  Laterality: Left;   HIP SURGERY     HOLMIUM LASER APPLICATION Right 07/15/2019   Procedure: HOLMIUM LASER APPLICATION;  Surgeon: Sherrilee Belvie CROME, MD;  Location: [facility];  Service: Urology;  Laterality: Right;   HOT HEMOSTASIS N/A 11/02/2022   Procedure: HOT HEMOSTASIS (ARGON PLASMA COAGULATION/BICAP);  Surgeon: Wilhelmenia Aloha Raddle., MD;  Location: [facility];  Service: Gastroenterology;  Laterality: N/A;   I & D EXTREMITY Left 07/25/2018   Procedure: Debridement of buttock, scrotum and left leg, placement of acell and vac;  Surgeon: Lowery Estefana RAMAN, DO;  [facility];  Service: Plastics;  Laterality: Left;   I & D  EXTREMITY N/A 08/06/2018   Procedure: Debridement of buttock, scrotum and left leg;  Surgeon: Lowery Estefana RAMAN, DO;  [facility];  Service: Plastics;  Laterality: N/A;   I & D EXTREMITY N/A 08/13/2018   Procedure: Debridement of buttock, scrotum and left leg, placement of acell and vac;  Surgeon: Lowery Estefana RAMAN, DO;  [facility];  Service: Plastics;  Laterality: N/A;  90 min, please   INCISION AND DRAINAGE HIP Left 09/18/2019   Procedure: IRRIGATION AND DEBRIDEMENT HIP/ PELVIS WITH  WOUND VAC PLACEMENT;  Surgeon: Kendal Franky SQUIBB, MD;  [facility];  Service: Orthopedics;  Laterality: Left;   INCISION AND DRAINAGE OF WOUND N/A 07/18/2018   Procedure: Debridement of left leg, buttocks and scrotal wound with placement of acell and Flexiseal;  Surgeon: Lowery Estefana RAMAN, DO;  [facility];  Service: Plastics;  Laterality: N/A;   INCISION AND DRAINAGE OF WOUND Left 08/29/2018   Procedure: Debridement of buttock, scrotum and left leg, placement of acell and vac;  Surgeon: Lowery Estefana RAMAN, DO;  [facility];  Service: Plastics;  Laterality: Left;  75 min, please   INCISION AND DRAINAGE OF WOUND Bilateral 10/23/2018   Procedure: DEBRIDEMENT OF BUTTOCK,SCROTUM, AND LEG WOUNDS WITH PLACEMENT OF ACELL- BILATERAL 90 MIN;  Surgeon: Lowery Estefana RAMAN, DO;  [facility];  Service: Plastics;  Laterality: Bilateral;   IR ANGIOGRAM PELVIS SELECTIVE OR SUPRASELECTIVE  07/10/2018   IR ANGIOGRAM PELVIS SELECTIVE OR SUPRASELECTIVE  07/10/2018   IR ANGIOGRAM SELECTIVE EACH ADDITIONAL VESSEL  07/10/2018   IR EMBO ART  VEN HEMORR LYMPH EXTRAV  INC GUIDE ROADMAPPING  07/10/2018   IR GASTROSTOMY TUBE MOD SED  01/21/2021   IR NEPHROSTOMY PLACEMENT LEFT  04/05/2019   IR NEPHROSTOMY PLACEMENT RIGHT  05/31/2019   IR US  GUIDE BX ASP/DRAIN  07/10/2018   IR US  GUIDE VASC ACCESS RIGHT  07/10/2018   IR VENO/EXT/UNI LEFT  07/10/2018   IRRIGATION AND DEBRIDEMENT OF WOUND WITH SPLIT THICKNESS SKIN GRAFT Left 09/19/2018   Procedure: Debridement of gluteal wound with placement of acell to left leg.;  Surgeon: Lowery Estefana RAMAN, DO;  [facility];  Service: Plastics;  Laterality: Left;  2.5 hours, please   LAPAROTOMY N/A 07/12/2018   Procedure: EXPLORATORY LAPAROTOMY;  Surgeon: Sebastian Moles, MD;  Location: [facility] OR;  Service: General;  Laterality: N/A;   LAPAROTOMY N/A 07/15/2018   Procedure: WOUND EXPLORATION; CLOSURE OF ABDOMEN;  Surgeon: Sebastian Moles, MD;  Location: [facility] OR;  Service: General;   Laterality: N/A;   LAPAROTOMY  07/10/2018   Procedure: Exploratory Laparotomy;  Surgeon: Signe Mitzie LABOR, MD;  Location: [facility] OR;  Service: General;;   MASS EXCISION Left 09/18/2019   Procedure: EXCISION UPPER LEFT INNER THIGH WOUND;  Surgeon: Lowery Estefana RAMAN, DO;  [facility];  Service: Plastics;  Laterality: Left;   NEPHROLITHOTOMY Right 07/15/2019   Procedure: NEPHROLITHOTOMY PERCUTANEOUS;  Surgeon: Sherrilee Belvie CROME, MD;  Location: [facility];  Service: Urology;  Laterality: Right;  90 MINS   PEG PLACEMENT N/A 08/14/2018   Procedure: PERCUTANEOUS ENDOSCOPIC GASTROSTOMY (PEG) PLACEMENT;  Surgeon: Sebastian Moles, MD;  Location: [facility] ENDOSCOPY;  Service: General;  Laterality: N/A;   PERCUTANEOUS TRACHEOSTOMY N/A 08/02/2018   Procedure: PERCUTANEOUS TRACHEOSTOMY;  Surgeon: Sebastian Moles, MD;  Location: [facility] OR;  Service: General;  Laterality: N/A;   RADIOLOGY WITH ANESTHESIA N/A 07/10/2018   Procedure: IR WITH ANESTHESIA;  Surgeon: Adele Rush, MD;  Location: [facility] OR;  Service: Radiology;  Laterality: N/A;   RADIOLOGY WITH ANESTHESIA Right 07/10/2018   Procedure: Ir With Anesthesia;  Surgeon: Adele Rush, MD;  Location: [facility] OR;  Service: Radiology;  Laterality: Right;   SAVORY DILATION N/A 11/02/2022   Procedure: SAVORY DILATION;  Surgeon: Wilhelmenia Aloha Raddle., MD;  Location: [facility];  Service: Gastroenterology;  Laterality: N/A;   SCROTAL EXPLORATION N/A 07/15/2018   Procedure: SCROTUM DEBRIDEMENT;  Surgeon: Matilda Senior, MD;  Location: [facility] OR;  Service: Urology;  Laterality: N/A;   SHOULDER SURGERY     SKIN SPLIT GRAFT Right 09/19/2018   Procedure: Skin Graft Split Thickness;  Surgeon: Lowery Estefana RAMAN, DO;  [facility];  Service: Plastics;  Laterality: Right;   SKIN SPLIT GRAFT N/A 10/03/2018   Procedure: Split thickness skin graft to gluteal area with acell placement;  Surgeon: Lowery Estefana RAMAN, DO;  [facility];  Service: Plastics;  Laterality: N/A;  3  hours, please   VACUUM ASSISTED CLOSURE CHANGE N/A 07/12/2018   Procedure: ABDOMINAL VACUUM ASSISTED CLOSURE CHANGE and abdominal washout;  Surgeon: Sebastian Moles, MD;  Location: [facility] OR;  Service: General;  Laterality: N/A;   WOUND DEBRIDEMENT Left 07/23/2018   Procedure: DEBRIDEMENT LEFT BUTTOCK  WOUND;  Surgeon: Sebastian Moles, MD;  Location: [facility] OR;  Service: General;  Laterality: Left;   WOUND EXPLORATION Left 07/10/2018   Procedure: WOUND EXPLORATION LEFT GROIN;  Surgeon: Signe Mitzie LABOR, MD;  Location[facility];  Service: General;  Laterality: Left;   Past Medical History:  Diagnosis Date   Acute on chronic respiratory failure with hypoxia (HCC) 06/2018   trach removed 11-16-2018, on vent from jan until may 2020 - uses albuterol  prn   Anxiety    Bacteremia due to Pseudomonas 06/2018   Chronic osteomyelitis (HCC)    Chronic pain syndrome    Clostridium difficile colitis 10/30/2019   tx with abx    Depression    DVT (deep venous thrombosis) (HCC) 2020   right brachial post PICC line   History of blood transfusion 06/2018   History of Clostridioides difficile colitis    History of kidney stones    Hypertension    norvasc  d/c by pcp on 11/05/19   Multiple traumatic injuries    Penile pain 11/18/2019   Pneumonia 11/2009   2020 x 2   Walker as ambulation aid    Wheelchair bound    electric   Wound discharge    left hip wound with bloody/clear drainage change dressing q day surgilube with gauze, between legs wound using calcium  algenate pad bid   BP 131/89   Pulse (!) 101   Ht 6' 3 (1.905 m)   SpO2 94%   BMI 24.50 kg/m   Opioid Risk Score:   Fall Risk Score:  `1  Depression screen [facility] 2/9     07/10/2024   10:41 AM 05/06/2024   10:45 AM 05/06/2024   10:37 AM 03/06/2024    9:23 AM 01/17/2024   12:46 PM 11/20/2023   10:55 AM 11/02/2023   10:44 AM  Depression screen PHQ 2/9  Decreased Interest 3 0 0 1 1 0 0  Down, Depressed, Hopeless 3 0 0 1 1 0 0  PHQ - 2 Score 6 0 0 2 2 0  0  Altered sleeping  0    0 0  Tired, decreased energy  0    0 0  Change in appetite  0    0 0  Feeling bad or failure about yourself  0    0 0  Trouble concentrating  0    0 0  Moving slowly or fidgety/restless  0    0 0  Suicidal thoughts  0    0 0  PHQ-9 Score  0    0  0   Difficult doing work/chores  Not difficult at all    Not difficult at all Not difficult at all     Data saved with a previous flowsheet row definition     Review of Systems  Musculoskeletal:  Positive for back pain and gait problem.  Psychiatric/Behavioral:  Positive for dysphoric mood.   All other systems reviewed and are negative.      Objective:   Physical Exam        Assessment & Plan:   Pt is a 59 yr old male with hx of Multitrauma- causing L femoral neck fx, degloving of L hip to going/scrotum, bladder neck trauma- got SPC, developed compartment syndrome -s/p surgery for that; also diverting colostomy, skin grafts, and s/p trach and PEG- PEG is out. 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 S/P screw removal and on IV ABX for L hip osteomyelitis. 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  Has R severe ulnar neuropathy as well. O2 3L full time.   Here for f/u on multitrauma and chronic L hip osteomyelitis with associated chronic pain.     We discussed options of changing pain meds- so could increase Duragesic  patch- and then decrease MSIR to 6x/day, not 8x/day.    2. Change MSIR to q3 hours- but max 190 tabs/month- so usually q4 hours except a few extra on  bad days- sending in 2 rx's for 2 months.    3. Needs new hospital bed mattress- with airbed and springs- since springs coming through- on EOB- so either needs it repaired successfully or replaced. Will write Rx.   4. Needs new hospital bed/table- has Rx from  PCP- Dr Kennyth.    5. Sending in Rx for increasing Duragesic  to 100 mcg/ q48 hours- for increasing hip and bladder pain- having severe pain from both  6. Getting Botox of bladder in 2 weeks. To help bladder spasms.  Needs to also do bladder neck- by Urology.   7.   As soon as he can, try to come off Imipramine  and then Oxybutynin  to try and  improve your QT prolongation, which increases risk of sudden death from cardiac slowing- educated pt and wife about this. Gemtesa doesn't do it as much, so can continue if need be.    8.  Seroquel  and Phenergan  also play a part in QT prolongation, but he cannot come off those right now.  9. Doesn't need refills on Seroquel , Imipramine , or Paxil  - all have 2+ more months on Rx's  10 Also doesn't need gabapentin  refills- last filled 11/2023- but has 1 year supply Rx.    11. Need to get into Psychiatry- Worker's comp having difficulty getting him in.    12.  D/w worker's comp liaison-Cynthia  13. F/U in 6 weeks as already scheduled- double appt- complex chronic pain and neurogenic bladder.   14. Pt needs to move more- staying in bed makes pain worse.    15 sounds like having increased drainage- oto see ID to make sure not getting worse- wounds looking worse.    16. Bring a picture of what wounds looks like- at next visit.  I spent a total of 41   minutes on total care today- >50% coordination of care- due to d/w pt about new Rx's- options for pain control; QT prolongation- don't have ANY more room to go up on pain meds after this.  D/w worker's comp about changes.      "

## 2024-07-10 NOTE — Patient Instructions (Addendum)
 Pt is a 59 yr old male with hx of Multitrauma- causing L femoral neck fx, degloving of L hip to going/scrotum, bladder neck trauma- got SPC, developed compartment syndrome -s/p surgery for that; also diverting colostomy, skin grafts, and s/p trach and PEG- PEG is out. 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 S/P screw removal and on IV ABX for L hip osteomyelitis. 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  Has R severe ulnar neuropathy as well. O2 3L full time.   Here for f/u on multitrauma and chronic L hip osteomyelitis with associated chronic pain.     We discussed options of changing pain meds- so could increase Duragesic  patch- and then decrease MSIR to 6x/day, not 8x/day.    2. Change MSIR to q3 hours- but max 190 tabs/month- so usually q4 hours except a few extra on bad days- sending in 2 rx's for 2 months.    3. Needs new hospital bed mattress- with airbed and springs- since springs coming through- on EOB- so either needs it repaired successfully or replaced. Will write Rx.   4. Needs new hospital bed/table- has Rx from  PCP- Dr Kennyth.    5. Sending in Rx for increasing Duragesic  to 100 mcg/ q48 hours- for increasing hip and bladder pain- having severe pain from both  6. Getting Botox of bladder in 2 weeks. To help bladder spasms.  Needs to also do bladder neck- by Urology.   7.   As soon as he can, try to come off Imipramine  and then Oxybutynin  to try and  improve your QT prolongation, which increases risk of sudden death from cardiac  slowing- educated pt and wife about this. Gemtesa doesn't do it as much, so can continue if need be.    8.  Seroquel  and Phenergan  also play a part in QT prolongation, but he cannot come off those right now.  9. Doesn't need refills on Seroquel , Imipramine , or Paxil  - all have 2+ more months on Rx's  10 Also doesn't need gabapentin  refills- last filled 11/2023- but has 1 year supply Rx.    11. Need to get into Psychiatry- Worker's comp having difficulty getting him in.    12.  D/w worker's comp liaison- Montie   13. F/U in 6 weeks as already scheduled- double appt- complex chronic pain and neurogenic bladder.    14. Pt needs to move more- staying in bed makes pain worse.    15 sounds like having increased drainage- to see ID to make sure not getting worse- wounds looking worse. Has appt 08/01/24

## 2024-07-18 ENCOUNTER — Telehealth: Payer: Self-pay

## 2024-07-18 DIAGNOSIS — J439 Emphysema, unspecified: Secondary | ICD-10-CM

## 2024-07-18 MED ORDER — BREZTRI AEROSPHERE 160-9-4.8 MCG/ACT IN AERO: 2.0000 | INHALATION_SPRAY | Freq: Two times a day (BID) | RESPIRATORY_TRACT | 0 refills | Status: AC

## 2024-07-18 NOTE — Progress Notes (Signed)
 Date of COVID positive in last 90 days:  PCP - Worth Kitty, MD LOV 05/06/24 Cardiologist - n/a Pulmonologist- Verdon Gore, MD  Chest x-ray - 03/07/24 Epic EKG - 07/18/24 Epic Stress Test - N/A ECHO - 10/31/22 Epic Cardiac Cath - N/A Pacemaker/ICD device last checked:N/A Spinal Cord Stimulator:N/A  Bowel Prep - N/A  Sleep Study - had test, was neg CPAP -   Fasting Blood Sugar - N/A Checks Blood Sugar _____ times a day  Last dose of GLP1 agonist-  N/A GLP1 instructions:  Do not take after     Last dose of SGLT-2 inhibitors-  N/A SGLT-2 instructions:  Do not take after     Blood Thinner Instructions: N/A Last dose:   Time: Aspirin  Instructions:N/A Last Dose:  Activity level: Patient is wheel chair bound. Has walker to use PRN for transfers, uses motorized wheelchair most of the time. Has air bed. Wife Andrea does ADLs. Patient denies SOB or CP.  Anesthesia review: COPD, HTN, DVT, 4L O2 con  Patient denies shortness of breath, fever, cough and chest pain at PAT appointment  Patient verbalized understanding of instructions that were given to them at the PAT appointment. Patient was also instructed that they will need to review over the PAT instructions again at home before surgery.

## 2024-07-18 NOTE — Patient Instructions (Signed)
 SURGICAL WAITING ROOM VISITATION  Patients having surgery or a procedure may have no more than 2 support people in the waiting area - these visitors may rotate.    Children ages 58 and under will not be able to visit patients in [facility] under most circumstances.   Visitors with respiratory illnesses are discouraged from visiting and should remain at home.  If the patient needs to stay at the hospital during part of their recovery, the visitor guidelines for inpatient rooms apply. Pre-op nurse will coordinate an appropriate time for 1 support person to accompany patient in pre-op.  This support person may not rotate.    Please refer to the [facility] website for the visitor guidelines for Inpatients (after your surgery is over and you are in a regular room).    Your procedure is scheduled on: 07/25/24   Report to [facility] Main Entrance    Report to admitting at 12:30 PM   Call this number if you have problems the morning of surgery [phone]   Do not eat food or drink liquids :After Midnight.          If you have questions, please contact your surgeons office.   FOLLOW BOWEL PREP AND ANY ADDITIONAL PRE OP INSTRUCTIONS YOU RECEIVED FROM YOUR SURGEON'S OFFICE!!!     Oral Hygiene is also important to reduce your risk of infection.                                    Remember - BRUSH YOUR TEETH THE MORNING OF SURGERY WITH YOUR REGULAR TOOTHPASTE  DENTURES WILL BE REMOVED PRIOR TO SURGERY PLEASE DO NOT APPLY Poly grip OR ADHESIVES!!!   Stop all vitamins and herbal supplements 7 days before surgery.   Take these medicines the morning of surgery with A SIP OF WATER :  Inhalers Gabapentin  (Neurotinin) Morphine  Pantoprazole  (Protonix ) Vacomycin              You may not have any metal on your body including hair pins, jewelry, and body piercing             Do not wear lotions, powders, cologne, or deodorant              Men may shave face and  neck.   Do not bring valuables to the hospital. [facility] IS NOT             RESPONSIBLE   FOR VALUABLES.   Contacts, glasses, dentures or bridgework may not be worn into surgery.  DO NOT BRING YOUR HOME MEDICATIONS TO THE HOSPITAL. PHARMACY WILL DISPENSE MEDICATIONS LISTED ON YOUR MEDICATION LIST TO YOU DURING YOUR ADMISSION IN THE HOSPITAL!    Patients discharged on the day of surgery will not be allowed to drive home.  Someone NEEDS to stay with you for the first 24 hours after anesthesia.              Please read over the following fact sheets you were given: IF YOU HAVE QUESTIONS ABOUT YOUR PRE-OP INSTRUCTIONS PLEASE CALL [phone] [place].   If you received a COVID test during your pre-op visit  it is requested that you wear a mask when out in public, stay away from anyone that may not be feeling well and notify your surgeon if you develop symptoms. If you test positive for Covid or have been in contact with anyone that has  tested positive in the last 10 days please notify you surgeon.    [facility] - Preparing for Surgery Before surgery, you can play an important role.  Because skin is not sterile, your skin needs to be as free of germs as possible.  You can reduce the number of germs on your skin by washing with CHG (chlorahexidine gluconate) soap before surgery.  CHG is an antiseptic cleaner which kills germs and bonds with the skin to continue killing germs even after washing. Please DO NOT use if you have an allergy to CHG or antibacterial soaps.  If your skin becomes reddened/irritated stop using the CHG and inform your nurse when you arrive at Short Stay. Do not shave (including legs and underarms) for at least 48 hours prior to the first CHG shower.  You may shave your face/neck.  Please follow these instructions carefully:  1.  Shower with CHG Soap the night before surgery and the morning of surgery.  2.  If you choose to wash your hair, wash your hair first as usual with  your normal  shampoo.  3.  After you shampoo, rinse your hair and body thoroughly to remove the shampoo.                             4.  Use CHG as you would any other liquid soap.  You can apply chg directly to the skin and wash.  Gently with a scrungie or clean washcloth.  5.  Apply the CHG Soap to your body ONLY FROM THE NECK DOWN.   Do   not use on face/ open                           Wound or open sores. Avoid contact with eyes, ears mouth and   genitals (private parts).                       Wash face,  Genitals (private parts) with your normal soap.             6.  Wash thoroughly, paying special attention to the area where your    surgery  will be performed.  7.  Thoroughly rinse your body with warm water  from the neck down.  8.  DO NOT shower/wash with your normal soap after using and rinsing off the CHG Soap.                9.  Pat yourself dry with a clean towel.            10.  Wear clean pajamas.            11.  Place clean sheets on your bed the night of your first shower and do not  sleep with pets. Day of Surgery : Do not apply any lotions/deodorants the morning of surgery.  Please wear clean clothes to the hospital/surgery center.  FAILURE TO FOLLOW THESE INSTRUCTIONS MAY RESULT IN THE CANCELLATION OF YOUR SURGERY  PATIENT SIGNATURE_________________________________  NURSE SIGNATURE__________________________________  ________________________________________________________________________

## 2024-07-18 NOTE — Telephone Encounter (Signed)
 Copied from CRM [id number]. Topic: Clinical - Medication Refill >> Jul 18, 2024 10:22 AM Leila BROCKS wrote: Montie case manager for workers comp claim [phone] needs a prescription for budesonide -glycopyrrolate -formoterol  (BREZTRI  AEROSPHERE) 160-9-4.8 MCG/ACT AERO refill, patient is out of medication. Can patient get refill until patient is seen with Dr. Adrien Guan 08/22/24 at 1 pm. Patient saw Dr. Meade 04/14/23. Please advise and call back patient's wife Charlett [phone].   [id number] [place], [place] -  [id number] [facility] [place] [address] Phone: [phone] Fax: [phone]   Tried calling [name] and there was no answer. Refill sent.  Needs to keep ov with Dr. Adrien in March 2026. Nfn

## 2024-07-19 ENCOUNTER — Encounter (HOSPITAL_COMMUNITY): Admission: RE | Admit: 2024-07-19 | Payer: PRIVATE HEALTH INSURANCE | Source: Ambulatory Visit

## 2024-07-19 ENCOUNTER — Encounter (HOSPITAL_COMMUNITY): Payer: Self-pay

## 2024-07-19 ENCOUNTER — Other Ambulatory Visit: Payer: Self-pay

## 2024-07-19 VITALS — BP 117/84 | HR 102 | Temp 98.4°F | Ht 75.0 in | Wt 180.0 lb

## 2024-07-19 DIAGNOSIS — I10 Essential (primary) hypertension: Secondary | ICD-10-CM

## 2024-07-19 HISTORY — DX: Chronic obstructive pulmonary disease, unspecified: J44.9

## 2024-07-19 HISTORY — DX: Unspecified asthma, uncomplicated: J45.909

## 2024-07-19 HISTORY — DX: Unspecified osteoarthritis, unspecified site: M19.90

## 2024-07-19 LAB — BASIC METABOLIC PANEL WITH GFR
Anion gap: 9 (ref 5–15)
BUN: 12 mg/dL (ref 6–20)
CO2: 29 mmol/L (ref 22–32)
Calcium: 10.5 mg/dL — ABNORMAL HIGH (ref 8.9–10.3)
Chloride: 99 mmol/L (ref 98–111)
Creatinine, Ser: 0.57 mg/dL — ABNORMAL LOW (ref 0.61–1.24)
GFR, Estimated: >60 mL/min
Glucose, Bld: 88 mg/dL (ref 70–99)
Potassium: 4 mmol/L (ref 3.5–5.1)
Sodium: 136 mmol/L (ref 135–145)

## 2024-07-19 LAB — CBC
HCT: 39.8 % (ref 39.0–52.0)
Hemoglobin: 12.6 g/dL — ABNORMAL LOW (ref 13.0–17.0)
MCH: 29.9 pg (ref 26.0–34.0)
MCHC: 31.7 g/dL (ref 30.0–36.0)
MCV: 94.5 fL (ref 80.0–100.0)
Platelets: 319 10*3/uL (ref 150–400)
RBC: 4.21 MIL/uL — ABNORMAL LOW (ref 4.22–5.81)
RDW: 13.2 % (ref 11.5–15.5)
WBC: 7.6 10*3/uL (ref 4.0–10.5)
nRBC: 0 % (ref 0.0–0.2)

## 2024-07-25 ENCOUNTER — Ambulatory Visit (HOSPITAL_COMMUNITY): Admission: RE | Admit: 2024-07-25 | Payer: PRIVATE HEALTH INSURANCE | Source: Home / Self Care | Admitting: Urology

## 2024-07-25 ENCOUNTER — Encounter (HOSPITAL_COMMUNITY): Admission: RE | Payer: Self-pay | Source: Home / Self Care

## 2024-07-25 SURGERY — CYSTOSCOPY, WITH INJECTION OF BLADDER NECK OR BLADDER WALL
Anesthesia: General

## 2024-08-01 ENCOUNTER — Ambulatory Visit: Payer: Self-pay | Admitting: Family

## 2024-08-21 ENCOUNTER — Encounter: Payer: PRIVATE HEALTH INSURANCE | Admitting: Physical Medicine and Rehabilitation

## 2024-10-02 ENCOUNTER — Encounter: Payer: PRIVATE HEALTH INSURANCE | Admitting: Physical Medicine and Rehabilitation

## 2024-11-05 ENCOUNTER — Ambulatory Visit: Payer: PRIVATE HEALTH INSURANCE | Admitting: Family Medicine

## 2024-11-13 ENCOUNTER — Encounter: Payer: PRIVATE HEALTH INSURANCE | Admitting: Physical Medicine and Rehabilitation

## 2024-12-25 ENCOUNTER — Encounter: Attending: Physical Medicine and Rehabilitation | Admitting: Physical Medicine and Rehabilitation
